# Patient Record
Sex: Female | Born: 1965
Health system: Southern US, Community
[De-identification: ages and names within clinical notes are randomized; demographics above are authoritative.]

## PROBLEM LIST (undated history)

## (undated) DIAGNOSIS — G35 Multiple sclerosis: Secondary | ICD-10-CM

## (undated) DIAGNOSIS — I69351 Hemiplegia and hemiparesis following cerebral infarction affecting right dominant side: Secondary | ICD-10-CM

## (undated) DIAGNOSIS — I10 Essential (primary) hypertension: Secondary | ICD-10-CM

## (undated) DIAGNOSIS — I2510 Atherosclerotic heart disease of native coronary artery without angina pectoris: Secondary | ICD-10-CM

## (undated) DIAGNOSIS — E785 Hyperlipidemia, unspecified: Secondary | ICD-10-CM

## (undated) DIAGNOSIS — I639 Cerebral infarction, unspecified: Secondary | ICD-10-CM

## (undated) DIAGNOSIS — Z9989 Dependence on other enabling machines and devices: Secondary | ICD-10-CM

## (undated) DIAGNOSIS — E119 Type 2 diabetes mellitus without complications: Secondary | ICD-10-CM

## (undated) DIAGNOSIS — N189 Chronic kidney disease, unspecified: Secondary | ICD-10-CM

## (undated) HISTORY — DX: Cerebral infarction, unspecified: I63.9

## (undated) HISTORY — DX: Hemiplegia and hemiparesis following cerebral infarction affecting right dominant side: I69.351

## (undated) HISTORY — DX: Essential (primary) hypertension: I10

## (undated) HISTORY — PX: APPENDECTOMY: SHX54

## (undated) HISTORY — DX: Hyperlipidemia, unspecified: E78.5

## (undated) HISTORY — PX: OVARIAN CYST SURGERY: SHX726

## (undated) HISTORY — PX: CYSTOSTOMY W/ BLADDER BIOPSY: SHX1431

## (undated) HISTORY — DX: Multiple sclerosis: G35

## (undated) HISTORY — DX: Type 2 diabetes mellitus without complications: E11.9

## (undated) HISTORY — PX: OTHER SURGICAL HISTORY: SHX169

---

## 2007-05-18 DIAGNOSIS — G35 Multiple sclerosis: Secondary | ICD-10-CM

## 2007-05-18 DIAGNOSIS — I219 Acute myocardial infarction, unspecified: Secondary | ICD-10-CM

## 2007-05-18 DIAGNOSIS — I252 Old myocardial infarction: Secondary | ICD-10-CM | POA: Insufficient documentation

## 2007-05-18 DIAGNOSIS — I2510 Atherosclerotic heart disease of native coronary artery without angina pectoris: Secondary | ICD-10-CM | POA: Insufficient documentation

## 2007-05-18 HISTORY — PX: PERCUTANEOUS CORONARY STENT INTERVENTION (PCI-S): SHX6016

## 2007-05-18 HISTORY — DX: Atherosclerotic heart disease of native coronary artery without angina pectoris: I25.10

## 2007-05-18 HISTORY — DX: Acute myocardial infarction, unspecified: I21.9

## 2007-05-18 HISTORY — DX: Multiple sclerosis: G35

## 2015-12-25 HISTORY — PX: NM MYOVIEW LTD: HXRAD82

## 2016-01-07 DIAGNOSIS — I2510 Atherosclerotic heart disease of native coronary artery without angina pectoris: Secondary | ICD-10-CM | POA: Insufficient documentation

## 2016-01-08 DIAGNOSIS — N179 Acute kidney failure, unspecified: Secondary | ICD-10-CM | POA: Insufficient documentation

## 2016-09-22 DIAGNOSIS — I69351 Hemiplegia and hemiparesis following cerebral infarction affecting right dominant side: Secondary | ICD-10-CM | POA: Insufficient documentation

## 2017-07-11 HISTORY — PX: TRANSTHORACIC ECHOCARDIOGRAM: SHX275

## 2017-07-12 DIAGNOSIS — R112 Nausea with vomiting, unspecified: Secondary | ICD-10-CM

## 2017-07-12 DIAGNOSIS — I639 Cerebral infarction, unspecified: Secondary | ICD-10-CM

## 2017-07-12 HISTORY — DX: Cerebral infarction, unspecified: I63.9

## 2017-07-12 HISTORY — DX: Nausea with vomiting, unspecified: R11.2

## 2017-07-18 DIAGNOSIS — N3289 Other specified disorders of bladder: Secondary | ICD-10-CM | POA: Insufficient documentation

## 2017-07-18 DIAGNOSIS — R319 Hematuria, unspecified: Secondary | ICD-10-CM | POA: Insufficient documentation

## 2017-07-28 DIAGNOSIS — R1030 Lower abdominal pain, unspecified: Secondary | ICD-10-CM | POA: Insufficient documentation

## 2018-03-15 ENCOUNTER — Ambulatory Visit: Payer: Medicare Other | Admitting: Physical Therapy

## 2018-03-22 ENCOUNTER — Other Ambulatory Visit: Payer: Self-pay

## 2018-03-22 ENCOUNTER — Ambulatory Visit: Payer: Medicare Other | Attending: Family Medicine | Admitting: Physical Therapy

## 2018-03-22 ENCOUNTER — Encounter: Payer: Self-pay | Admitting: Physical Therapy

## 2018-03-22 DIAGNOSIS — R262 Difficulty in walking, not elsewhere classified: Secondary | ICD-10-CM | POA: Diagnosis present

## 2018-03-22 DIAGNOSIS — I69351 Hemiplegia and hemiparesis following cerebral infarction affecting right dominant side: Secondary | ICD-10-CM | POA: Insufficient documentation

## 2018-03-22 DIAGNOSIS — R2689 Other abnormalities of gait and mobility: Secondary | ICD-10-CM | POA: Insufficient documentation

## 2018-03-22 NOTE — Therapy (Signed)
[facility] [address] [place], [place], [address] Phone: [phone]   Fax:  [phone]  Physical Therapy Evaluation  Patient Details  Name: Hannah [name] MRN: [id number] Date of Birth: 19-Jun-1965 Referring Provider (PT): Via, Lennette Bihari, MD   Encounter Date: 03/22/2018  PT End of Session - 03/22/18 1607    Visit Number  1    Date for PT Re-Evaluation  05/17/18    PT Start Time  0092    PT Stop Time  1527    PT Time Calculation (min)  42 min    Equipment Utilized During Treatment  --   rollator   Activity Tolerance  Patient tolerated treatment well    Behavior During Therapy  [facility] for tasks assessed/performed       Past Medical History:  Diagnosis Date  . Cerebral infarction ([place]) 07/12/2017   2009 first one, has had a total of 4 stokes  . Diabetes type 2, controlled ([place])   . Multiple sclerosis ([place]) 2009    History reviewed. No pertinent surgical history.  There were no vitals filed for this visit.   Subjective Assessment - 03/22/18 1453    Subjective  Pt is a pleasant 52yo female who presents to PT s/p her 4th stroke which occurred in late Feb.  She is accompanied by her adult daughter with whom she lives.  PMH is significant for previous strokes, diabetes (controlled), and MS.  She presents with rollator walker which she reports getting 2 weeks ago and now uses for community ambulation.  She uses a 2-wheel walker in the home.  She was walking without an AD before most recent stroke.  Her stroke has affected her Rt dominant side as well as her speech and memory.  Pt reports she has become significantly limited from this recent stroke compared to the first three.  She did not do rehab after this stroke as she says "they were going to send me to a nursing home and I didn't want to do that."    Patient is accompained by:  Family member   adult daughter   Pertinent History  4th stroke, MS, diabetic    Limitations   Walking;House hold activities;Lifting;Standing;Writing;Reading    Patient Stated Goals  balance, walking better, get stronger, stairs    Currently in Pain?  No/denies         [facility] PT Assessment - 03/22/18 0001      Assessment   Medical Diagnosis  I69.351 (ICD-10-CM) - Hemiplegia and hemiparesis following cerebral infarction affecting right dominant side    Referring Provider (PT)  Via, Lennette Bihari, MD    Onset Date/Surgical Date  07/13/17    Hand Dominance  Right    Next MD Visit  Jan 2020    Prior Therapy  after prior strokes, home health PT    no rehab after Feb 2019 stroke     Precautions   Precautions  None      Restrictions   Weight Bearing Restrictions  No      Balance Screen   Has the patient fallen in the past 6 months  Yes    How many times?  3    Has the patient had a decrease in activity level because of a fear of falling?   No    Is the patient reluctant to leave their home because of a fear of falling?   No      Home Film/video editor  residence    Living Arrangements  Children    Available Help at Discharge  Family    Type of [place] to enter    Entrance Stairs-Number of Steps  [place]  One level    [place] - 2 wheels;Shower seat;Toilet riser   rollator for community ambulation     Prior Function   Level of Independence  Needs assistance with transfers;Needs assistance with ADLs;Requires assistive device for independence;Independent with household mobility with device;Independent with community mobility with device;Needs assistance with homemaking   currently dependent on assistive device full time, daughter   Vocation  Unemployed    Leisure  used to write and read but not now      Cognition   Overall Cognitive Status  Impaired/Different from baseline    Area of Impairment  Memory    Memory  Decreased short-term memory      Tone   Assessment  Location  Right Lower Extremity;Right Upper Extremity      ROM / Strength   AROM / PROM / Strength  AROM;PROM;Strength      AROM   AROM Assessment Site  Shoulder    Right/Left Shoulder  Right    Right Shoulder Flexion  100 Degrees    Right Shoulder ABduction  70 Degrees    Right/Left Hip  --    Right Hip Flexion  --    Right Hip External Rotation   --    Right Hip Internal Rotation   --    Right Hip ABduction  --      PROM   PROM Assessment Site  Shoulder;Hip    Right/Left Shoulder  Right    Right Shoulder Flexion  110 Degrees   firm end feel   Right Shoulder ABduction  70 Degrees   firm end feel   Right/Left Hip  Right    Right Hip External Rotation   60   flexion 10, spastic, abduction 20, spastic   Right Hip Internal Rotation   10      Strength   Overall Strength  Deficits    Overall Strength Comments  grossly 4/5 Lt UE and Lt LE    Strength Assessment Site  Shoulder;Hip;Knee    Right/Left Shoulder  Right    Right Shoulder Flexion  3/5    Right Shoulder Extension  3/5    Right Shoulder ABduction  3/5    Right/Left Hip  Right    Right Hip Flexion  3+/5    Right Hip Extension  3+/5    Right Hip ABduction  3-/5    Right/Left Knee  Right    Right Knee Flexion  3+/5    Right Knee Extension  3+/5      Ambulation/Gait   Ambulation/Gait  Yes    Ambulation/Gait Assistance  6: Modified independent (Device/Increase time)    Ambulation Distance (Feet)  60 Feet    Assistive device  Rollator    Gait Pattern  Step-through pattern;Decreased step length - right   Rt LE spasticity with mild toe walking   Ambulation Surface  Level;Indoor    Stairs  Yes    Stairs Assistance  4: Min guard    Stair Management Technique  Two rails;Forwards;Backwards;Step to pattern   forwards up, backwards down   Number of Stairs  4    Height of Stairs  6  RUE Tone   RUE Tone  Mild;Hypertonic      RLE Tone   RLE Tone  Severe;Hypertonic                Objective measurements  completed on examination: See above findings.                PT Short Term Goals - 03/22/18 2052      PT SHORT TERM GOAL #1   Title  Pt will be independent in initial HEP.    Time  3    Period  Weeks    Status  New    Target Date  04/12/18      PT SHORT TERM GOAL #2   Title  Pt will report incorporation of Rt UE into at least 25% of daily tasks.    Time  4    Period  Weeks    Status  New    Target Date  04/19/18      PT SHORT TERM GOAL #3   Title  Pt will demonstrate safe transfers with most efficient strategy from sit to stand, stand to sit and all bed mobility tasks.    Time  4    Period  Weeks    Status  New    Target Date  04/19/18      PT SHORT TERM GOAL #4   Title  Pt will achieve Rt shoulder flexion A/ROM to at least 120 degrees to improve overhead task abilities.        PT Long Term Goals - 03/22/18 2057      PT LONG TERM GOAL #1   Title  Pt will report functional use of Rt UE for at least 50% of daily tasks.    Time  8    Period  Weeks    Status  New    Target Date  05/17/18      PT LONG TERM GOAL #2   Title  Pt will perform community ambulation x 500 feet with least restrictive assistive device while negotiating a curb and ramp to improve safety and endurance in the community.    Time  8    Period  Weeks    Status  New    Target Date  05/17/18      PT LONG TERM GOAL #3   Title  Pt will perform safe and efficient strategy for ascending/descending stairs with least restrictive assistive device so she will be able to negotiate flight of stairs to/from her apartment.    Time  8    Period  Weeks    Status  New    Target Date  05/17/18             Plan - 03/22/18 2101    Clinical Impression Statement  Pt presents to PT with history of MS and 4 strokes over the past 10 years.  Most recent stroke occurred in Feb 2019 and has reportedly had the greatest impact on her function.  She was previously able to ambulate without AD but now uses a  2-wheel walker or rollator full time.  She appears to have partial return of strength and tone returning in her Rt UE and displays significant spasticity in Rt LE.  Rt shoulder A/ROM and P/ROM are limited and weak with capsular end feel.  She ambulates with rigidity in Rt LE and mild toe walk with rollator.  She lives in a second-floor apartment which is without an Media planner requiring her  to negotiate approx. 13 steps each time she leaves her home.  She currently goes up stairs forward and down stairs backward with use of rails and step-to pattern.  She will benefit from skilled PT to address her deficits and maximize her functional independence following most recent stroke.     History and Personal Factors relevant to plan of care:  four strokes in 10 years, MS, diabetic    Clinical Presentation  Evolving    Clinical Decision Making  Moderate    Rehab Potential  Fair    Clinical Impairments Affecting Rehab Potential  four strokes in 10 years with significant change in status with recent stroke, PMH of MS, diabetes    PT Frequency  2x / week    PT Duration  8 weeks    PT Treatment/Interventions  ADLs/Self Care Home Management;DME Instruction;Gait training;Stair training;Functional mobility training;Therapeutic activities;Therapeutic exercise;Balance training;Neuromuscular re-education;Cognitive remediation;Patient/family education;Orthotic Fit/Training;Manual techniques;Passive range of motion;Energy conservation;Splinting;Visual/perceptual remediation/compensation    PT Next Visit Plan  bed mobility, transfers, gait, balance, stair training, Rt shoulder ROM, perform other functional tests and update/revise goals as needed    PT Home Exercise Plan  begin initial HEP    Consulted and Agree with Plan of Care  Patient;Family member/caregiver       Patient will benefit from skilled therapeutic intervention in order to improve the following deficits and impairments:  Abnormal gait, Decreased coordination,  Decreased range of motion, Difficulty walking, Impaired tone, Decreased endurance, Decreased safety awareness, Increased muscle spasms, Impaired UE functional use, Decreased activity tolerance, Impaired vision/preception, Decreased balance, Decreased knowledge of use of DME, Hypomobility, Improper body mechanics, Decreased cognition, Decreased mobility, Decreased strength  Visit Diagnosis: Hemiplegia and hemiparesis following cerebral infarction affecting right dominant side ([place]) - Plan: PT plan of care cert/re-cert  Difficulty in walking, not elsewhere classified - Plan: PT plan of care cert/re-cert  Other abnormalities of gait and mobility - Plan: PT plan of care cert/re-cert     Problem List There are no active problems to display for this patient.   Venetia Night Francies Inch, PT 03/22/18 9:24 PM    [facility] [address] [place], [place], [address] Phone: [phone]   Fax:  [phone]  Name: Jossilyn Benda MRN: [id number] Date of Birth: 04-24-1966

## 2018-03-29 ENCOUNTER — Ambulatory Visit: Payer: Medicare Other | Attending: Family Medicine | Admitting: Physical Therapy

## 2018-03-29 DIAGNOSIS — I69351 Hemiplegia and hemiparesis following cerebral infarction affecting right dominant side: Secondary | ICD-10-CM

## 2018-03-29 DIAGNOSIS — R2689 Other abnormalities of gait and mobility: Secondary | ICD-10-CM

## 2018-03-29 DIAGNOSIS — R262 Difficulty in walking, not elsewhere classified: Secondary | ICD-10-CM | POA: Diagnosis present

## 2018-03-29 NOTE — Therapy (Signed)
[facility] [address] [place], [place], [address] Phone: [phone]   Fax:  [phone]  Physical Therapy Treatment  Patient Details  Name: Hannah [name] MRN: [id number] Date of Birth: July 26, 1965 Referring Provider (PT): Via, Lennette Bihari, MD   Encounter Date: 03/29/2018  PT End of Session - 03/29/18 1440    Visit Number  2    Number of Visits  17    Date for PT Re-Evaluation  05/17/18    Authorization Type  [employer] Medicare    PT Start Time  2992    PT Stop Time  1430    PT Time Calculation (min)  45 min    Activity Tolerance  Patient tolerated treatment well    Behavior During Therapy  [facility] for tasks assessed/performed       Past Medical History:  Diagnosis Date  . Cerebral infarction ([place]) 07/12/2017   2009 first one, has had a total of 4 stokes  . Diabetes type 2, controlled ([place])   . Multiple sclerosis ([place]) 2009    No past surgical history on file.  There were no vitals filed for this visit.  Subjective Assessment - 03/29/18 1435    Subjective  daughter present for session - pt ambulating with rollator. patient reporting she has not seen neurologist in years.     Patient is accompained by:  Family member   adult daughter   Pertinent History  4th stroke, MS, diabetic    Limitations  Walking;House hold activities;Lifting;Standing;Writing;Reading    Patient Stated Goals  balance, walking better, get stronger, stairs    Currently in Pain?  No/denies                       [facility] Adult PT Treatment/Exercise - 03/29/18 0001      Exercises   Exercises  Knee/Hip      Knee/Hip Exercises: Stretches   Passive Hamstring Stretch  Right;3 reps;30 seconds    Passive Hamstring Stretch Limitations  LE extended on stool with overpressure into dorsiflexion      Knee/Hip Exercises: Standing   Other Standing Knee Exercises  high knee marching at rollator x 10 each LE      Knee/Hip Exercises: Seated   Long Arc Quad  Strengthening;Right;10 reps    Sit to [employer]  1 set;10 reps;with UE support      Knee/Hip Exercises: Supine   Bridges  Strengthening;Both;10 reps             PT Education - 03/29/18 1445    Education Details  education/discussion on need for referral to neurology and OT. Education on current POC and current status/goals; education on initial HEP handout     Person(s) Educated  Patient;Child(ren)    Methods  Explanation;Demonstration;Handout    Comprehension  Verbalized understanding;Returned demonstration       PT Short Term Goals - 03/22/18 2052      PT SHORT TERM GOAL #1   Title  Pt will be independent in initial HEP.    Time  3    Period  Weeks    Status  New    Target Date  04/12/18      PT SHORT TERM GOAL #2   Title  Pt will report incorporation of Rt UE into at least 25% of daily tasks.    Time  4    Period  Weeks    Status  New    Target Date  04/19/18  PT SHORT TERM GOAL #3   Title  Pt will demonstrate safe transfers with most efficient strategy from sit to stand, stand to sit and all bed mobility tasks.    Time  4    Period  Weeks    Status  New    Target Date  04/19/18      PT SHORT TERM GOAL #4   Title  Pt will achieve Rt shoulder flexion A/ROM to at least 120 degrees to improve overhead task abilities.        PT Long Term Goals - 03/22/18 2057      PT LONG TERM GOAL #1   Title  Pt will report functional use of Rt UE for at least 50% of daily tasks.    Time  8    Period  Weeks    Status  New    Target Date  05/17/18      PT LONG TERM GOAL #2   Title  Pt will perform community ambulation x 500 feet with least restrictive assistive device while negotiating a curb and ramp to improve safety and endurance in the community.    Time  8    Period  Weeks    Status  New    Target Date  05/17/18      PT LONG TERM GOAL #3   Title  Pt will perform safe and efficient strategy for ascending/descending stairs with least restrictive  assistive device so she will be able to negotiate flight of stairs to/from her apartment.    Time  8    Period  Weeks    Status  New    Target Date  05/17/18            Plan - 03/29/18 1441    Clinical Impression Statement  Patient seen at neuro PT today for follow-up. Ambulating with rollator and daughter present and supportive. Lengthy discussion on current status and care team with patient reporting she has not been sen by neurologist in some time. Education provided by PT stating note to be sent to PCP requesting referral to neurology as well as OT to address UE defcits at R UE. Patient and daughter in agreement of both services. Remainder of session spent establishing initial HEP for gentle stretching and strengthening for improved functional mobility. Patient and daughter with good carryover of tasks. Clonus noted at B ankle with R > L. Will continue to progress towards goals.     Rehab Potential  Fair    Clinical Impairments Affecting Rehab Potential  four strokes in 10 years with significant change in status with recent stroke, PMH of MS, diabetes    PT Frequency  2x / week    PT Duration  8 weeks    PT Treatment/Interventions  ADLs/Self Care Home Management;DME Instruction;Gait training;Stair training;Functional mobility training;Therapeutic activities;Therapeutic exercise;Balance training;Neuromuscular re-education;Cognitive remediation;Patient/family education;Orthotic Fit/Training;Manual techniques;Passive range of motion;Energy conservation;Splinting;Visual/perceptual remediation/compensation    PT Next Visit Plan  bed mobility, transfers, gait, balance, stair training, Rt shoulder ROM, perform other functional tests and update/revise goals as needed; send note for referral to [place] and Agree with Plan of Care  Patient;Family member/caregiver       Patient will benefit from skilled therapeutic intervention in order to improve the  following deficits and impairments:  Abnormal gait, Decreased coordination, Decreased range of motion, Difficulty walking, Impaired tone, Decreased endurance, Decreased safety awareness, Increased muscle spasms, Impaired  UE functional use, Decreased activity tolerance, Impaired vision/preception, Decreased balance, Decreased knowledge of use of DME, Hypomobility, Improper body mechanics, Decreased cognition, Decreased mobility, Decreased strength  Visit Diagnosis: Hemiplegia and hemiparesis following cerebral infarction affecting right dominant side ([facility])  Difficulty in walking, not elsewhere classified  Other abnormalities of gait and mobility     Problem List There are no active problems to display for this patient.    Lanney Gins, PT, DPT Supplemental Physical Therapist 03/29/18 4:35 PM Pager: [phone] Office: [place] [address] [place], [place], [address] Phone: [phone]   Fax:  [phone]  Name: Hannah [name] MRN: [id number] Date of Birth: 1965-11-16

## 2018-03-29 NOTE — Patient Instructions (Signed)
Access Code: [email or web address]  URL: [email or web address]  Date: 03/29/2018  Prepared by: Allena Katz   Exercises  Supine Bridge - 10 reps - 2 sets - 1x daily - 7x weekly  Seated Long Arc Quad - 10 reps - 2 sets - 1x daily - 7x weekly  Standing March with Counter Support - 10 reps - 2 sets - 1x daily - 7x weekly  Seated Hamstring Stretch with Chair - 3 reps - 1 sets - 30 hold - 1x daily - 7x weekly  Sit to Stand with Armchair - 10 reps - 2 sets - 1x daily - 7x weekly

## 2018-03-31 ENCOUNTER — Ambulatory Visit: Payer: Medicare Other | Admitting: Physical Therapy

## 2018-03-31 ENCOUNTER — Encounter: Payer: Self-pay | Admitting: Physical Therapy

## 2018-03-31 DIAGNOSIS — I69351 Hemiplegia and hemiparesis following cerebral infarction affecting right dominant side: Secondary | ICD-10-CM

## 2018-03-31 DIAGNOSIS — R262 Difficulty in walking, not elsewhere classified: Secondary | ICD-10-CM

## 2018-03-31 DIAGNOSIS — R2689 Other abnormalities of gait and mobility: Secondary | ICD-10-CM

## 2018-03-31 NOTE — Therapy (Signed)
[facility] [address] [place], [place], [address] Phone: [phone]   Fax:  [phone]  Physical Therapy Treatment  Patient Details  Name: Hannah [name] MRN: [id number] Date of Birth: Mar 12, 1966 Referring Provider (PT): Via, Lennette Bihari, MD   Encounter Date: 03/31/2018  PT End of Session - 03/31/18 1921    Visit Number  3    Number of Visits  17    Date for PT Re-Evaluation  05/17/18    Authorization Type  [employer] Medicare    PT Start Time  1400    PT Stop Time  1445    PT Time Calculation (min)  45 min    Activity Tolerance  Patient tolerated treatment well    Behavior During Therapy  [facility] for tasks assessed/performed       Past Medical History:  Diagnosis Date  . Cerebral infarction ([place]) 07/12/2017   2009 first one, has had a total of 4 stokes  . Diabetes type 2, controlled ([place])   . Multiple sclerosis ([place]) 2009    History reviewed. No pertinent surgical history.  There were no vitals filed for this visit.  Subjective Assessment - 03/31/18 1409    Subjective  No issues to report and no falls.  May call Dr. Via to check on the OT order.    Patient is accompained by:  Family member   adult daughter   Pertinent History  4th stroke, MS, diabetic    Limitations  Walking;House hold activities;Lifting;Standing;Writing;Reading    Patient Stated Goals  balance, walking better, get stronger, stairs    Currently in Pain?  No/denies         [facility] PT Assessment - 03/31/18 1413      Standardized Balance Assessment   Standardized Balance Assessment  Berg Balance Test;Five Times Sit to Stand;10 meter walk test    Five times sit to stand comments   1:12 without use of UE but pushes back on mat with back of legs and increased difficulty shifting forwards    10 Meter Walk  25.28 seconds or 1.29 ft/sec with rollator      Berg Balance Test   Sit to Stand  Able to stand  independently using hands    Standing Unsupported   Able to stand 2 minutes with supervision    Sitting with Back Unsupported but Feet Supported on Floor or Stool  Able to sit safely and securely 2 minutes    Stand to Sit  Controls descent by using hands    Transfers  Able to transfer safely, definite need of hands    Standing Unsupported with Eyes Closed  Able to stand 10 seconds with supervision    Standing Ubsupported with Feet Together  Needs help to attain position but able to stand for 30 seconds with feet together    From Standing, Reach Forward with Outstretched Arm  Can reach forward >12 cm safely (5")    From Standing Position, Pick up Object from Floor  Able to pick up shoe, needs supervision    From Standing Position, Turn to Look Behind Over each Shoulder  Looks behind from both sides and weight shifts well    Turn 360 Degrees  Needs close supervision or verbal cueing    Standing Unsupported, Alternately Place Feet on Step/Stool  Able to complete >2 steps/needs minimal assist    Standing Unsupported, One Foot in Front  Needs help to step but can hold 15 seconds    Standing on One  Leg  Tries to lift leg/unable to hold 3 seconds but remains standing independently    Total Score  34    Berg comment:  34/56                           PT Education - 03/31/18 1920    Education Details  Awaiting referral to OT; will send referral request to neurology.  Outcomes of assessments    Person(s) Educated  Patient;Child(ren)    Methods  Explanation    Comprehension  Verbalized understanding       PT Short Term Goals - 03/31/18 1932      PT SHORT TERM GOAL #1   Title  Pt will be independent in initial HEP.    Time  3    Period  Weeks    Status  New    Target Date  04/21/18      PT SHORT TERM GOAL #2   Title  Pt will improve LE strength as indicated by five time sit to stance to </= 50 seconds without UE support     Baseline  1:12 from mat, pushed back on mat with back of legs    Time  4    Period  Weeks     Status  New    Target Date  04/21/18      PT SHORT TERM GOAL #3   Title  Pt will demonstrate safe transfers with supervision from sit to stand, stand to sit and all bed mobility tasks.    Time  4    Period  Weeks    Status  Revised    Target Date  04/21/18      PT SHORT TERM GOAL #4   Title  Pt will improve BERG by 4 points    Baseline  34/56    Time  4    Period  Weeks    Status  New    Target Date  04/21/18      PT SHORT TERM GOAL #5   Title  Pt will improve gait velocity with RW to >/= 1.6 ft/sec    Baseline  1.29 ft/sec    Time  4    Period  Weeks    Target Date  04/21/18        PT Long Term Goals - 03/31/18 1937      PT LONG TERM GOAL #1   Title  Pt and daughter will demonstrate independence with HEP    Time  8    Period  Weeks    Status  New    Target Date  05/21/18      PT LONG TERM GOAL #2   Title  Pt will perform community ambulation x 500 feet with least restrictive assistive device while negotiating a curb and ramp to improve safety and endurance in the community with supervision    Time  8    Period  Weeks    Status  New    Target Date  05/21/17      PT LONG TERM GOAL #3   Title  Pt will perform safe and efficient strategy for ascending/descending stairs with least restrictive assistive device so she will be able to negotiate flight of stairs to/from her apartment.    Time  8    Period  Weeks    Status  New    Target Date  05/21/17      PT LONG TERM  GOAL #4   Title  Pt will improve five time sit to stand to </= 40 seconds without use of UE    Time  8    Period  Weeks    Status  New    Target Date  05/21/18      PT LONG TERM GOAL #5   Title  Pt will improve BERG by 8 points (from initial assessment)    Baseline  34/56    Time  8    Period  Weeks    Status  New    Target Date  05/21/18      PT LONG TERM GOAL #6   Title  Pt will improve gait velocity with rollator and appropriate AFO to >/= 2.2 ft/sec    Time  8    Period  Weeks    Status   New    Target Date  05/21/18            Plan - 03/31/18 1922    Clinical Impression Statement  Performed formal assessment of patient's functional LE strength and sit to stand sequencing with five time sit to stand, falls risk and balance assessment with BERG and gait velocity.  Pt demonstrates high falls risk especially with tasks that require single limb stance and pt is heavily reliant on UE for support.  LTG updated.  Will begin to update HEP to address these impairments.    Rehab Potential  Fair    Clinical Impairments Affecting Rehab Potential  four strokes in 10 years with significant change in status with recent stroke, PMH of MS, diabetes    PT Frequency  2x / week    PT Duration  8 weeks    PT Treatment/Interventions  ADLs/Self Care Home Management;DME Instruction;Gait training;Stair training;Functional mobility training;Therapeutic activities;Therapeutic exercise;Balance training;Neuromuscular re-education;Cognitive remediation;Patient/family education;Orthotic Fit/Training;Manual techniques;Passive range of motion;Energy conservation;Splinting;Visual/perceptual remediation/compensation    PT Next Visit Plan  Has OT referral come back?  add to HEP balance exercises based on BERG.  bed mobility, transfers, gait, balance, stair training, Rt shoulder ROM    PT Home Exercise Plan  [email or web address]     Recommended Other Services  AFO?  Aquatic?    Consulted and Agree with Plan of Care  Patient;Family member/caregiver    Family Member Consulted  daughter       Patient will benefit from skilled therapeutic intervention in order to improve the following deficits and impairments:  Abnormal gait, Decreased coordination, Decreased range of motion, Difficulty walking, Impaired tone, Decreased endurance, Decreased safety awareness, Increased muscle spasms, Impaired UE functional use, Decreased activity tolerance, Impaired vision/preception, Decreased balance, Decreased knowledge of use of DME,  Hypomobility, Improper body mechanics, Decreased cognition, Decreased mobility, Decreased strength  Visit Diagnosis: Hemiplegia and hemiparesis following cerebral infarction affecting right dominant side (HCC)  Difficulty in walking, not elsewhere classified  Other abnormalities of gait and mobility     Problem List There are no active problems to display for this patient.   Rico Junker, PT, DPT 03/31/18    7:41 PM    [facility] [address] [place], [place], [address] Phone: [phone]   Fax:  [phone]  Name: Hannah [name] MRN: [id number] Date of Birth: 1965/09/17

## 2018-04-05 ENCOUNTER — Ambulatory Visit: Payer: Medicare Other | Admitting: Physical Therapy

## 2018-04-05 ENCOUNTER — Encounter: Payer: Self-pay | Admitting: Physical Therapy

## 2018-04-05 DIAGNOSIS — R2689 Other abnormalities of gait and mobility: Secondary | ICD-10-CM

## 2018-04-05 DIAGNOSIS — I69351 Hemiplegia and hemiparesis following cerebral infarction affecting right dominant side: Secondary | ICD-10-CM | POA: Diagnosis not present

## 2018-04-05 DIAGNOSIS — R262 Difficulty in walking, not elsewhere classified: Secondary | ICD-10-CM

## 2018-04-05 NOTE — Therapy (Signed)
[facility] [address] [place], [place], [address] Phone: [phone]   Fax:  [phone]  Physical Therapy Treatment  Patient Details  Name: Hannah [name] MRN: [id number] Date of Birth: [birth year] Referring Provider (PT): Via, Lennette Bihari, MD   Encounter Date: 04/05/2018  PT End of Session - 04/05/18 1542    Visit Number  4    Number of Visits  17    Date for PT Re-Evaluation  05/17/18    Authorization Type  [employer] Medicare    PT Start Time  1536    PT Stop Time  1615    PT Time Calculation (min)  39 min    Activity Tolerance  Patient tolerated treatment well    Behavior During Therapy  [facility] for tasks assessed/performed       Past Medical History:  Diagnosis Date  . Cerebral infarction ([place]) 07/12/2017   2009 first one, has had a total of 4 stokes  . Diabetes type 2, controlled ([place])   . Multiple sclerosis ([place]) 2009    History reviewed. No pertinent surgical history.  There were no vitals filed for this visit.  Subjective Assessment - 04/05/18 1542    Subjective  doing well - seen by eye MD today with injections given - some blurriness    Patient is accompained by:  Family member    Pertinent History  4th stroke, MS, diabetic    Limitations  Walking;House hold activities;Lifting;Standing;Writing;Reading    Patient Stated Goals  balance, walking better, get stronger, stairs    Currently in Pain?  No/denies    Multiple Pain Sites  No                       [facility] Adult PT Treatment/Exercise - 04/05/18 0001      Ambulation/Gait   Ambulation/Gait  Yes    Ambulation/Gait Assistance  5: Supervision    Ambulation/Gait Assistance Details  supervision with cueing for increased step length and to promote R heel strike    Ambulation Distance (Feet)  100 Feet    Assistive device  Rollator    Gait Pattern  Step-through pattern;Decreased step length - right;Decreased step length - left;Decreased stance  time - right;Decreased hip/knee flexion - right;Decreased dorsiflexion - right;Decreased weight shift to right    Ambulation Surface  Level;Indoor      Exercises   Exercises  Other Exercises    Other Exercises   tall kneeling with k-table: heel sitting to tall kneeling with B UE support x 10; tall kneel side stepping with k-table (4 steps bilaterally x 6 reps)          Balance Exercises - 04/05/18 1619      Balance Exercises: Standing   Standing Eyes Opened  Narrow base of support (BOS);3 reps;30 secs   in corner   Tandem Stance  Eyes open;2 reps;30 secs   bilateral; modified 1/2 step   SLS  Eyes open;Upper extremity support 1;4 reps;20 secs   bilateral       PT Education - 04/05/18 1625    Education Details  discussion to make OT appointment for Eval and to fax prior neurology notes to PCP for referral to [facility] neurologist    Person(s) Educated  Patient;Child(ren)    Methods  Explanation    Comprehension  Verbalized understanding       PT Short Term Goals - 03/31/18 1932      PT SHORT TERM GOAL #1   Title  Pt will be independent in initial HEP.    Time  3    Period  Weeks    Status  New    Target Date  04/21/18      PT SHORT TERM GOAL #2   Title  Pt will improve LE strength as indicated by five time sit to stance to </= 50 seconds without UE support     Baseline  1:12 from mat, pushed back on mat with back of legs    Time  4    Period  Weeks    Status  New    Target Date  04/21/18      PT SHORT TERM GOAL #3   Title  Pt will demonstrate safe transfers with supervision from sit to stand, stand to sit and all bed mobility tasks.    Time  4    Period  Weeks    Status  Revised    Target Date  04/21/18      PT SHORT TERM GOAL #4   Title  Pt will improve BERG by 4 points    Baseline  34/56    Time  4    Period  Weeks    Status  New    Target Date  04/21/18      PT SHORT TERM GOAL #5   Title  Pt will improve gait velocity with RW to >/= 1.6 ft/sec     Baseline  1.29 ft/sec    Time  4    Period  Weeks    Target Date  04/21/18        PT Long Term Goals - 03/31/18 1937      PT LONG TERM GOAL #1   Title  Pt and daughter will demonstrate independence with HEP    Time  8    Period  Weeks    Status  New    Target Date  05/21/18      PT LONG TERM GOAL #2   Title  Pt will perform community ambulation x 500 feet with least restrictive assistive device while negotiating a curb and ramp to improve safety and endurance in the community with supervision    Time  8    Period  Weeks    Status  New    Target Date  05/21/17      PT LONG TERM GOAL #3   Title  Pt will perform safe and efficient strategy for ascending/descending stairs with least restrictive assistive device so she will be able to negotiate flight of stairs to/from her apartment.    Time  8    Period  Weeks    Status  New    Target Date  05/21/17      PT LONG TERM GOAL #4   Title  Pt will improve five time sit to stand to </= 40 seconds without use of UE    Time  8    Period  Weeks    Status  New    Target Date  05/21/18      PT LONG TERM GOAL #5   Title  Pt will improve BERG by 8 points (from initial assessment)    Baseline  34/56    Time  8    Period  Weeks    Status  New    Target Date  05/21/18      PT LONG TERM GOAL #6   Title  Pt will improve gait velocity with rollator and appropriate AFO  to >/= 2.2 ft/sec    Time  8    Period  Weeks    Status  New    Target Date  05/21/18            Plan - 04/05/18 1543    Clinical Impression Statement  Patient seen by eye MD today with injections - drop of blood noted at eye crease, wiped clean with no further bleeding - notified daughter who may reach out to MD. Session today focusing on initiating balance activities with HEP update given and explained to both patient and daughter. Noted poor heel strike with R LE with giat today even with cueing and demonstration by PT. WIll continue to benefit from skilled PT.      Rehab Potential  Fair    Clinical Impairments Affecting Rehab Potential  four strokes in 10 years with significant change in status with recent stroke, PMH of MS, diabetes    PT Frequency  2x / week    PT Duration  8 weeks    PT Treatment/Interventions  ADLs/Self Care Home Management;DME Instruction;Gait training;Stair training;Functional mobility training;Therapeutic activities;Therapeutic exercise;Balance training;Neuromuscular re-education;Cognitive remediation;Patient/family education;Orthotic Fit/Training;Manual techniques;Passive range of motion;Energy conservation;Splinting;Visual/perceptual remediation/compensation    PT Next Visit Plan  add to HEP balance exercises based on BERG.  bed mobility, transfers, gait, balance, stair training, Rt shoulder ROM    PT Home Exercise Plan  [email or web address]     Consulted and Agree with Plan of Care  Patient;Family member/caregiver    Family Member Consulted  daughter       Patient will benefit from skilled therapeutic intervention in order to improve the following deficits and impairments:  Abnormal gait, Decreased coordination, Decreased range of motion, Difficulty walking, Impaired tone, Decreased endurance, Decreased safety awareness, Increased muscle spasms, Impaired UE functional use, Decreased activity tolerance, Impaired vision/preception, Decreased balance, Decreased knowledge of use of DME, Hypomobility, Improper body mechanics, Decreased cognition, Decreased mobility, Decreased strength  Visit Diagnosis: Hemiplegia and hemiparesis following cerebral infarction affecting right dominant side (HCC)  Difficulty in walking, not elsewhere classified  Other abnormalities of gait and mobility     Problem List There are no active problems to display for this patient.    Lanney Gins, PT, DPT Supplemental Physical Therapist 04/05/18 4:29 PM Pager: [phone] Office: [facility] [place] [address] [place], [place], [address] Phone: [phone]   Fax:  [phone]  Name: Hannah [name] MRN: [id number] Date of Birth: 28-Apr-1966

## 2018-04-07 ENCOUNTER — Ambulatory Visit: Payer: Medicare Other | Admitting: Physical Therapy

## 2018-04-11 ENCOUNTER — Ambulatory Visit: Payer: Medicare Other | Admitting: Physical Therapy

## 2018-04-12 ENCOUNTER — Ambulatory Visit: Payer: Medicare Other | Admitting: Physical Therapy

## 2018-04-17 ENCOUNTER — Ambulatory Visit: Payer: Medicare Other | Attending: Family Medicine | Admitting: Physical Therapy

## 2018-04-17 ENCOUNTER — Encounter: Payer: Self-pay | Admitting: Physical Therapy

## 2018-04-17 DIAGNOSIS — R262 Difficulty in walking, not elsewhere classified: Secondary | ICD-10-CM | POA: Diagnosis present

## 2018-04-17 DIAGNOSIS — I69351 Hemiplegia and hemiparesis following cerebral infarction affecting right dominant side: Secondary | ICD-10-CM | POA: Insufficient documentation

## 2018-04-17 DIAGNOSIS — R2689 Other abnormalities of gait and mobility: Secondary | ICD-10-CM | POA: Diagnosis not present

## 2018-04-17 NOTE — Therapy (Signed)
[facility] [address] [place], [place], [address] Phone: [phone]   Fax:  [phone]  Physical Therapy Treatment  Patient Details  Name: Hannah [name] MRN: [id number] Date of Birth: [birth year] Referring Provider (PT): Via, Lennette Bihari, MD   Encounter Date: 04/17/2018  PT End of Session - 04/17/18 1619    Visit Number  5    Number of Visits  17    Date for PT Re-Evaluation  05/17/18    Authorization Type  [employer] Medicare    PT Start Time  1404    PT Stop Time  1445    PT Time Calculation (min)  41 min    Activity Tolerance  Patient tolerated treatment well    Behavior During Therapy  [facility] for tasks assessed/performed       Past Medical History:  Diagnosis Date  . Cerebral infarction ([place]) 07/12/2017   2009 first one, has had a total of 4 stokes  . Diabetes type 2, controlled ([place])   . Multiple sclerosis ([place]) 2009    History reviewed. No pertinent surgical history.  There were no vitals filed for this visit.  Subjective Assessment - 04/17/18 1404    Subjective  She reports walking around the whole church last Sunday and asked the doctor for a cane.  She keeps relying on rollator, though.  No falls.    Patient is accompained by:  Family member    Pertinent History  4th stroke, MS, diabetic    Limitations  Walking;House hold activities;Lifting;Standing;Writing;Reading    Patient Stated Goals  balance, walking better, get stronger, stairs    Currently in Pain?  No/denies                       [facility] Adult PT Treatment/Exercise - 04/17/18 0001      Ambulation/Gait   Ambulation/Gait  Yes    Ambulation/Gait Assistance  5: Supervision   tactile cues at hips, verbal cues for increased R heelstrike   Ambulation/Gait Assistance Details  Trialed gait with foot-up brace on RLE (secured with tape), with improved dorsiflexion/foot clearance noted.  However, pt needs cues for slowed pace, to increase RLE  stance time.  Pt noted to have R knee recurvatum during RLE stance.    Ambulation Distance (Feet)  100 Feet   120 x 2 reps with rollator and foot-up brace   Assistive device  Rollator    Gait Pattern  Step-through pattern;Decreased step length - right;Decreased step length - left;Decreased stance time - right;Decreased hip/knee flexion - right;Decreased dorsiflexion - right;Decreased weight shift to right;Right genu recurvatum;Poor foot clearance - right    Ambulation Surface  Level;Indoor    Pre-Gait Activities  At beginning of session, and several times during session, pt inquires about using cane on a more regular basis.  PT discussed fall risk per Berg score and recommendation to use cane, per Berg score <37/56.  PT asked pt if she had ever had AFO; at end of session, pt reports having both double upright AFO for RLE (but only one the right shoe, as the left shoe got lost in the move to [place]) and a plastic AFO (which PT requests pt brings in next visit for further gait assessment).    Gait Comments  With tactile cues at R hip/R quads, pt able to improve R foot clearance and stance time on RLE; however, with only use of foot-up brace, pt is noted to have Rknee recurvatum in stance phase  of gait.          Balance Exercises - 04/17/18 1418      Balance Exercises: Standing   Standing Eyes Opened  Narrow base of support (BOS);Solid surface;3 reps;10 secs    Tandem Stance  Eyes open;Upper extremity support 1;3 reps;10 secs    SLS  Eyes open;Solid surface;Upper extremity support 2;3 reps;10 secs    Sidestepping  2 reps;Upper extremity support   R and L along counter   Other Standing Exercises  RLE as weightbearing leg, with LLE hip abduction taps x 10, kicks x 10; then LLE hip extension taps x 10, kicks x 10; UE's supported at counter with cues for upright posture.        PT Education - 04/17/18 1618    Education Details  Updates to HEP; discussed pt only performing standing exercises  with daughter present for supervision; requested pt to bring her (old) AFO in to next PT session; educated pt again in fall risk per Berg score and to not use cane, but use rollator at this time    Person(s) Educated  Patient   daughter present at beginning of session, but had to go to work   Methods  Explanation;Demonstration;Handout    Comprehension  Verbalized understanding;Returned demonstration;Verbal cues required;Need further instruction       PT Short Term Goals - 03/31/18 1932      PT SHORT TERM GOAL #1   Title  Pt will be independent in initial HEP.    Time  3    Period  Weeks    Status  New    Target Date  04/21/18      PT SHORT TERM GOAL #2   Title  Pt will improve LE strength as indicated by five time sit to stance to </= 50 seconds without UE support     Baseline  1:12 from mat, pushed back on mat with back of legs    Time  4    Period  Weeks    Status  New    Target Date  04/21/18      PT SHORT TERM GOAL #3   Title  Pt will demonstrate safe transfers with supervision from sit to stand, stand to sit and all bed mobility tasks.    Time  4    Period  Weeks    Status  Revised    Target Date  04/21/18      PT SHORT TERM GOAL #4   Title  Pt will improve BERG by 4 points    Baseline  34/56    Time  4    Period  Weeks    Status  New    Target Date  04/21/18      PT SHORT TERM GOAL #5   Title  Pt will improve gait velocity with RW to >/= 1.6 ft/sec    Baseline  1.29 ft/sec    Time  4    Period  Weeks    Target Date  04/21/18        PT Long Term Goals - 03/31/18 1937      PT LONG TERM GOAL #1   Title  Pt and daughter will demonstrate independence with HEP    Time  8    Period  Weeks    Status  New    Target Date  05/21/18      PT LONG TERM GOAL #2   Title  Pt will perform community ambulation x 500  feet with least restrictive assistive device while negotiating a curb and ramp to improve safety and endurance in the community with supervision    Time  8     Period  Weeks    Status  New    Target Date  05/21/17      PT LONG TERM GOAL #3   Title  Pt will perform safe and efficient strategy for ascending/descending stairs with least restrictive assistive device so she will be able to negotiate flight of stairs to/from her apartment.    Time  8    Period  Weeks    Status  New    Target Date  05/21/17      PT LONG TERM GOAL #4   Title  Pt will improve five time sit to stand to </= 40 seconds without use of UE    Time  8    Period  Weeks    Status  New    Target Date  05/21/18      PT LONG TERM GOAL #5   Title  Pt will improve BERG by 8 points (from initial assessment)    Baseline  34/56    Time  8    Period  Weeks    Status  New    Target Date  05/21/18      PT LONG TERM GOAL #6   Title  Pt will improve gait velocity with rollator and appropriate AFO to >/= 2.2 ft/sec    Time  8    Period  Weeks    Status  New    Target Date  05/21/18            Plan - 04/17/18 1620    Clinical Impression Statement  Skilled PT session today addressed balance with additions to HEP.  Educated pt regarding gait safety, as pt wants to walk with a cane; educated pt to use rollator at this time due to high fall risk.  Trialed gait with R foot up brace, as pt's shoe not condusive to using AFO for trial; pt improves R foot clearance, heel strike, step length slightly, but noted to have increased RLE recurvatum in stance.  She does report having an old AFO at home and PT recommends pt to bring that in to next visit.  Pt will continue to benefit from skilled PT to address balance, strength, gait for improved functional mobility and safety.    Rehab Potential  Fair    Clinical Impairments Affecting Rehab Potential  four strokes in 10 years with significant change in status with recent stroke, PMH of MS, diabetes    PT Frequency  2x / week    PT Duration  8 weeks    PT Treatment/Interventions  ADLs/Self Care Home Management;DME Instruction;Gait  training;Stair training;Functional mobility training;Therapeutic activities;Therapeutic exercise;Balance training;Neuromuscular re-education;Cognitive remediation;Patient/family education;Orthotic Fit/Training;Manual techniques;Passive range of motion;Energy conservation;Splinting;Visual/perceptual remediation/compensation    PT Next Visit Plan  Review HEP/may need to take some exercises out so she doesn't have so many; gait training (pt to bring in her old AFO) or possible trial of AFO in clinic    PT Gila and Agree with Plan of Care  Patient;Family member/caregiver    Family Member Consulted  daughter-at beginning of session       Patient will benefit from skilled therapeutic intervention in order to improve the following deficits and impairments:  Abnormal gait, Decreased coordination, Decreased range of motion, Difficulty walking, Impaired  tone, Decreased endurance, Decreased safety awareness, Increased muscle spasms, Impaired UE functional use, Decreased activity tolerance, Impaired vision/preception, Decreased balance, Decreased knowledge of use of DME, Hypomobility, Improper body mechanics, Decreased cognition, Decreased mobility, Decreased strength  Visit Diagnosis: Other abnormalities of gait and mobility     Problem List There are no active problems to display for this patient.   Hykeem Ojeda W. 04/17/2018, 4:24 PM  Frazier Butt., PT   [facility] [address] [place], [place], [address] Phone: [phone]   Fax:  [phone]  Name: Hannah [name] MRN: [id number] Date of Birth: 08-Mar-1966

## 2018-04-17 NOTE — Patient Instructions (Addendum)
Access Code: [email or web address]  URL: [email or web address]  Date: 04/17/2018  Prepared by: Mady Haagensen   Exercises  Supine Bridge - 10 reps - 2 sets - 1x daily - 7x weekly  Seated Long Arc Quad - 10 reps - 2 sets - 1x daily - 7x weekly  Standing March with Counter Support - 10 reps - 2 sets - 1x daily - 7x weekly  Seated Hamstring Stretch with Chair - 3 reps - 1 sets - 30 hold - 1x daily - 7x weekly  Sit to Stand with Armchair - 10 reps - 2 sets - 1x daily - 7x weekly  Romberg Stance - 3-5 reps - 1 sets - 30 hold - 1x daily - 7x weekly  Standing Romberg to 1/2 Tandem Stance - 3-5 reps - 1 sets - 30 hold - 1x daily - 7x weekly   Added 04/17/18 Standing Single Leg Stance with Counter Support - 3 reps - 1 sets - 10 sec hold - 1x daily - 5x weekly  Standing Hip Abduction with Counter Support - 10 reps - 1 sets - 1x daily - 5x weekly  Standing Tandem Balance with Counter Support - 3 reps - 1 sets - 10 sec hold - 1x daily - 5x weekly  Standing Hip Extension with Counter Support - 10 reps - 1 sets - 1x daily - 5x weekly

## 2018-04-18 ENCOUNTER — Ambulatory Visit: Payer: Medicare Other | Admitting: Occupational Therapy

## 2018-04-20 ENCOUNTER — Ambulatory Visit: Payer: Medicare Other | Admitting: Physical Therapy

## 2018-04-21 ENCOUNTER — Ambulatory Visit: Payer: Medicare Other | Admitting: Occupational Therapy

## 2018-04-26 ENCOUNTER — Encounter: Payer: Self-pay | Admitting: Physical Therapy

## 2018-04-26 ENCOUNTER — Ambulatory Visit: Payer: Medicare Other | Admitting: Physical Therapy

## 2018-04-26 DIAGNOSIS — R262 Difficulty in walking, not elsewhere classified: Secondary | ICD-10-CM

## 2018-04-26 DIAGNOSIS — I69351 Hemiplegia and hemiparesis following cerebral infarction affecting right dominant side: Secondary | ICD-10-CM

## 2018-04-26 DIAGNOSIS — R2689 Other abnormalities of gait and mobility: Secondary | ICD-10-CM

## 2018-04-26 NOTE — Therapy (Signed)
[facility] [address] [place], [place], [address] Phone: [phone]   Fax:  [phone]  Physical Therapy Treatment  Patient Details  Name: Hannah [name] MRN: [id number] Date of Birth: March 17, 1966 Referring Provider (PT): Via, Lennette Bihari, MD   Encounter Date: 04/26/2018  PT End of Session - 04/26/18 1415    Visit Number  6    Number of Visits  17    Date for PT Re-Evaluation  05/17/18    Authorization Type  [employer] Medicare    PT Start Time  1315    PT Stop Time  1400    PT Time Calculation (min)  45 min    Activity Tolerance  Patient tolerated treatment well    Behavior During Therapy  [facility] for tasks assessed/performed       Past Medical History:  Diagnosis Date  . Cerebral infarction ([place]) 07/12/2017   2009 first one, has had a total of 4 stokes  . Diabetes type 2, controlled ([place])   . Multiple sclerosis ([place]) 2009    History reviewed. No pertinent surgical history.  There were no vitals filed for this visit.  Subjective Assessment - 04/26/18 1321    Subjective  No falls but states she has had several stumbles and caught her balance; stated she gets dizzy at times  described as out of balance; pt and daughter state she's been doing her ex's at home;     Patient is accompained by:  Family member    Limitations  Walking;House hold activities;Lifting;Standing;Writing;Reading    Patient Stated Goals  balance, walking better, get stronger, stairs    Currently in Pain?  No/denies    Multiple Pain Sites  No         [facility] PT Assessment - 04/26/18 0001      Transfers   Five time sit to stand comments   50 seconds no hands    Comments  supine to/from sit ; independent      Ambulation/Gait   Ambulation/Gait  Yes    Ambulation/Gait Assistance  5: Supervision    Ambulation Distance (Feet)  230 Feet    Assistive device  Rollator    Gait Pattern  Step-through pattern    Ambulation Surface  Level;Indoor    Gait  velocity  1.9 ft/sec      Berg Balance Test   Sit to Stand  Able to stand without using hands and stabilize independently    Standing Unsupported  Able to stand safely 2 minutes    Sitting with Back Unsupported but Feet Supported on Floor or Stool  Able to sit safely and securely 2 minutes    Stand to Sit  Sits safely with minimal use of hands    Transfers  Able to transfer with verbal cueing and /or supervision    Standing Unsupported with Eyes Closed  Able to stand 10 seconds with supervision    Standing Ubsupported with Feet Together  Able to place feet together independently and stand 1 minute safely    From Standing, Reach Forward with Outstretched Arm  Can reach forward >5 cm safely (2")    From Standing Position, Pick up Object from Floor  Unable to pick up shoe, but reaches 2-5 cm (1-2") from shoe and balances independently    From Standing Position, Turn to Look Behind Over each Shoulder  Looks behind one side only/other side shows less weight shift    Turn 360 Degrees  Able to turn 360 degrees safely  but slowly    Standing Unsupported, Alternately Place Feet on Step/Stool  Needs assistance to keep from falling or unable to try    Standing Unsupported, One Foot in Front  Needs help to step but can hold 15 seconds    Standing on One Leg  Tries to lift leg/unable to hold 3 seconds but remains standing independently    Total Score  36                   [facility] Adult PT Treatment/Exercise - 04/26/18 0001      Balance   Balance Assessed  Yes      Static Standing Balance   Single Leg Stance - Right Leg  --   needs UE support             PT Education - 04/26/18 1414    Education Details  Importance of daily HEP; recommneded they pursue getting a tub bench and a hand rail for the bed to improve safety with houshold transfers and importance of working on single leg ex's     Person(s) Educated  Patient    Methods  Explanation    Comprehension  Verbalized  understanding       PT Short Term Goals - 04/26/18 1404      PT SHORT TERM GOAL #1   Title  Pt will be independent in initial HEP.    Time  3    Period  Weeks    Status  On-going      PT SHORT TERM GOAL #2   Title  Pt will improve LE strength as indicated by five time sit to stance to </= 50 seconds without UE support     Baseline  1:12 from mat, pushed back on mat with back of legs    Time  4    Period  Weeks    Status  Achieved      PT SHORT TERM GOAL #3   Title  Pt will demonstrate safe transfers with supervision from sit to stand, stand to sit and all bed mobility tasks.    Time  4    Period  Weeks    Status  Partially Met      PT SHORT TERM GOAL #4   Title  Pt will improve BERG by 4 points    Baseline  34/56    Time  4    Period  Weeks    Status  Not Met      PT SHORT TERM GOAL #5   Title  Pt will improve gait velocity with RW to >/= 1.6 ft/sec    Baseline  1.29 ft/sec    Time  4    Period  Weeks    Status  Achieved        PT Long Term Goals - 03/31/18 1937      PT LONG TERM GOAL #1   Title  Pt and daughter will demonstrate independence with HEP    Time  8    Period  Weeks    Status  New    Target Date  05/21/18      PT LONG TERM GOAL #2   Title  Pt will perform community ambulation x 500 feet with least restrictive assistive device while negotiating a curb and ramp to improve safety and endurance in the community with supervision    Time  8    Period  Weeks    Status  New  Target Date  05/21/17      PT LONG TERM GOAL #3   Title  Pt will perform safe and efficient strategy for ascending/descending stairs with least restrictive assistive device so she will be able to negotiate flight of stairs to/from her apartment.    Time  8    Period  Weeks    Status  New    Target Date  05/21/17      PT LONG TERM GOAL #4   Title  Pt will improve five time sit to stand to </= 40 seconds without use of UE    Time  8    Period  Weeks    Status  New     Target Date  05/21/18      PT LONG TERM GOAL #5   Title  Pt will improve BERG by 8 points (from initial assessment)    Baseline  34/56    Time  8    Period  Weeks    Status  New    Target Date  05/21/18      PT LONG TERM GOAL #6   Title  Pt will improve gait velocity with rollator and appropriate AFO to >/= 2.2 ft/sec    Time  8    Period  Weeks    Status  New    Target Date  05/21/18            Plan - 04/26/18 1416    Clinical Impression Statement  Pt demonstrated inmprovement toward all the STG's; see assessment; need to continue to advance the HEP; recommend adding disassociation of head/body/eyes with the current ex's ;  pt made a comment at the end of session that warrants further investigation with possible postional vertigo with transfers;  she is still a high fall risk and requires UE support for any dynamic mvt with standing     Clinical Presentation  Evolving    Clinical Decision Making  Low    Rehab Potential  Good    Clinical Impairments Affecting Rehab Potential  four strokes in 10 years with significant change in status with recent stroke, PMH of MS, diabetes    PT Next Visit Plan  further assess comments about postional vertigo     Consulted and Agree with Plan of Care  Patient;Family member/caregiver       Patient will benefit from skilled therapeutic intervention in order to improve the following deficits and impairments:  Abnormal gait, Decreased coordination, Decreased range of motion, Difficulty walking, Impaired tone, Decreased endurance, Decreased safety awareness, Increased muscle spasms, Impaired UE functional use, Decreased activity tolerance, Impaired vision/preception, Decreased balance, Decreased knowledge of use of DME, Hypomobility, Improper body mechanics, Decreased cognition, Decreased mobility, Decreased strength  Visit Diagnosis: Other abnormalities of gait and mobility  Hemiplegia and hemiparesis following cerebral infarction affecting right  dominant side (HCC)  Difficulty in walking, not elsewhere classified     Problem List There are no active problems to display for this patient.   Rosaura Carpenter D  PT DPT  04/26/2018, 2:23 PM  [facility] [address] [place], [place], [address] Phone: [phone]   Fax:  [phone]  Name: Chyna Kneece MRN: [id number] Date of Birth: Jun 15, 1965

## 2018-04-28 ENCOUNTER — Ambulatory Visit: Payer: Medicare Other | Admitting: Physical Therapy

## 2018-04-28 ENCOUNTER — Encounter: Payer: Self-pay | Admitting: Physical Therapy

## 2018-04-28 DIAGNOSIS — I69351 Hemiplegia and hemiparesis following cerebral infarction affecting right dominant side: Secondary | ICD-10-CM

## 2018-04-28 DIAGNOSIS — R2689 Other abnormalities of gait and mobility: Secondary | ICD-10-CM

## 2018-04-28 DIAGNOSIS — R262 Difficulty in walking, not elsewhere classified: Secondary | ICD-10-CM

## 2018-04-28 NOTE — Therapy (Signed)
[facility] [address] [place], [place], [address] Phone: [phone]   Fax:  [phone]  Physical Therapy Treatment  Patient Details  Name: Hannah [name] MRN: [id number] Date of Birth: May 25, 1965 Referring Provider (PT): Via, Lennette Bihari, MD   Encounter Date: 04/28/2018  PT End of Session - 04/28/18 1546    Visit Number  7    Number of Visits  17    Date for PT Re-Evaluation  05/17/18    Authorization Type  UHC Medicare    PT Start Time  1320    PT Stop Time  1400    PT Time Calculation (min)  40 min    Activity Tolerance  Patient tolerated treatment well    Behavior During Therapy  [facility] for tasks assessed/performed       Past Medical History:  Diagnosis Date  . Cerebral infarction ([place]) 07/12/2017   2009 first one, has had a total of 4 stokes  . Diabetes type 2, controlled ([place])   . Multiple sclerosis ([place]) 2009    History reviewed. No pertinent surgical history.  There were no vitals filed for this visit.  Subjective Assessment - 04/28/18 1326    Subjective  Pt has been using her cane at home but family wants her to keep using rollator.  Pt does report dizziness with quick turns in bed, quick head/body turns and when she returns from leaning down    Patient is accompained by:  Family member    Limitations  Walking;House hold activities;Lifting;Standing;Writing;Reading    Patient Stated Goals  balance, walking better, get stronger, stairs    Currently in Pain?  No/denies             Vestibular Assessment - 04/28/18 1328      Vestibular Assessment   General Observation  had dizziness after stroke then it resolved.  Returned about a month ago      Symptom Behavior   Type of Dizziness  Imbalance    Frequency of Dizziness  intermittent    Duration of Dizziness  few seconds    Aggravating Factors  Turning body quickly;Turning head quickly;Rolling to right;Rolling to left;Forward bending    Relieving  Factors  No known relieving factors      Occulomotor Exam   Occulomotor Alignment  Abnormal   pt keeps head tilted to R   Spontaneous  Absent    Gaze-induced  Absent    Smooth Pursuits  Intact    Saccades  Slow    Comment  convergence impaired      Vestibulo-Occular Reflex   VOR Cancellation  Normal    Comment  HIT: - bilaterally      Positional Testing   Dix-Hallpike  Dix-Hallpike Right;Dix-Hallpike Left    Horizontal Canal Testing  Horizontal Canal Right;Horizontal Canal Left      Dix-Hallpike Right   Dix-Hallpike Right Duration  0    Dix-Hallpike Right Symptoms  No nystagmus      Dix-Hallpike Left   Dix-Hallpike Left Duration  0    Dix-Hallpike Left Symptoms  No nystagmus      Horizontal Canal Right   Horizontal Canal Right Duration  0    Horizontal Canal Right Symptoms  Normal      Horizontal Canal Left   Horizontal Canal Left Duration  0    Horizontal Canal Left Symptoms  Normal      Positional Sensitivities   Sit to Supine  No dizziness    Supine to Left  Side  No dizziness    Supine to Right Side  No dizziness    Supine to Sitting  Mild dizziness    Right Hallpike  No dizziness    Up from Right Hallpike  Mild dizziness    Up from Left Hallpike  No dizziness    Nose to Right Knee  No dizziness    Right Knee to Sitting  Mild dizziness    Nose to Left Knee  No dizziness    Left Knee to Sitting  No dizziness    Head Turning x 5  Lightheadedness    Head Nodding x 5  No dizziness    Pivot Right in Standing  Moderate dizziness    Pivot Left in Standing  No dizziness               [facility] Adult PT Treatment/Exercise - 04/28/18 1543      Ambulation/Gait   Ambulation/Gait  Yes    Ambulation/Gait Assistance  4: Min assist    Ambulation/Gait Assistance Details  trial of gait with small based quad cane with turning L and R around obstacles; therapist performed tactile cues to maintain upright posture and control of forward momentum due to forwards LOB.   Advised pt to continue with rollator at home for now and will continue to work on gait with cane in therapy sessions    Ambulation Distance (Feet)  115 Feet    Assistive device  Small based quad cane    Gait Pattern  Step-through pattern;Decreased step length - right;Decreased step length - left;Decreased stride length;Trunk flexed    Ambulation Surface  Level;Indoor             PT Education - 04/28/18 1545    Education Details  vestibular findings of dysequilibrium; continue to use rollator in home and will continue cane in therapy    Person(s) Educated  Patient;Other (comment)   friend   Methods  Explanation    Comprehension  Verbalized understanding       PT Short Term Goals - 04/28/18 1548      PT SHORT TERM GOAL #1   Title  Pt will be independent in initial HEP.    Time  3    Period  Weeks    Status  On-going      PT SHORT TERM GOAL #2   Title  Pt will improve LE strength as indicated by five time sit to stance to </= 50 seconds without UE support     Baseline  50 seconds    Time  4    Period  Weeks    Status  Achieved      PT SHORT TERM GOAL #3   Title  Pt will demonstrate safe transfers with supervision from sit to stand, stand to sit and all bed mobility tasks.    Time  4    Period  Weeks    Status  Partially Met      PT SHORT TERM GOAL #4   Title  Pt will improve BERG by 4 points    Baseline  34/56 > 36/56    Time  4    Period  Weeks    Status  Partially Met      PT SHORT TERM GOAL #5   Title  Pt will improve gait velocity with RW to >/= 1.6 ft/sec    Baseline  1.9 ft/sec with rollator    Time  4    Period  Weeks  Status  Achieved        PT Long Term Goals - 04/28/18 1550      PT LONG TERM GOAL #1   Title  Pt and daughter will demonstrate independence with HEP (05/21/2018 for all LTG)    Time  8    Period  Weeks    Status  New      PT LONG TERM GOAL #2   Title  Pt will perform community ambulation x 500 feet with least restrictive  assistive device while negotiating a curb and ramp to improve safety and endurance in the community with supervision    Time  8    Period  Weeks    Status  New      PT LONG TERM GOAL #3   Title  Pt will perform safe and efficient strategy for ascending/descending stairs with least restrictive assistive device so she will be able to negotiate flight of stairs to/from her apartment.    Time  8    Period  Weeks    Status  New      PT LONG TERM GOAL #4   Title  Pt will improve five time sit to stand to </= 40 seconds without use of UE    Baseline  50 sec    Time  8    Period  Weeks    Status  New      PT LONG TERM GOAL #5   Title  Pt will improve BERG by 8 points (from initial assessment)    Baseline  34/56 > 36/56    Time  8    Period  Weeks    Status  New      PT LONG TERM GOAL #6   Title  Pt will improve gait velocity with rollator and appropriate AFO to >/= 2.2 ft/sec    Baseline  1.9 at 4 weeks    Time  8    Period  Weeks    Status  New            Plan - 04/28/18 1546    Clinical Impression Statement  Due to ongoing reports of "dizziness" performed full vestibular evaluation with pt demonstrating negative positional testing.  Pt does demonstrate motion sensitivity and dysequilibrium, especially with pivoting/turning.  Initiated gait assessment and training with quad cane due to pt's goal of using cane at home.  Pt requires min A currently for safe ambulation with cane; will continue to address in therapy but did advised pt to continue to use rollator at home    Rehab Potential  Good    Clinical Impairments Affecting Rehab Potential  four strokes in 10 years with significant change in status with recent stroke, PMH of MS, diabetes    PT Frequency  2x / week    PT Duration  8 weeks    PT Treatment/Interventions  ADLs/Self Care Home Management;DME Instruction;Gait training;Stair training;Functional mobility training;Therapeutic activities;Therapeutic exercise;Balance  training;Neuromuscular re-education;Cognitive remediation;Patient/family education;Orthotic Fit/Training;Manual techniques;Passive range of motion;Energy conservation;Splinting;Visual/perceptual remediation/compensation    PT Next Visit Plan  continued to gait train with quad cane focusing on control of forward momentum; may need to revise HEP.  Balance with turns without UE support    PT Home Exercise Plan  [email or web address]     Consulted and Agree with Plan of Care  Patient;Family member/caregiver       Patient will benefit from skilled therapeutic intervention in order to improve the following deficits and impairments:  Abnormal gait, Decreased coordination,  Decreased range of motion, Difficulty walking, Impaired tone, Decreased endurance, Decreased safety awareness, Increased muscle spasms, Impaired UE functional use, Decreased activity tolerance, Impaired vision/preception, Decreased balance, Decreased knowledge of use of DME, Hypomobility, Improper body mechanics, Decreased cognition, Decreased mobility, Decreased strength  Visit Diagnosis: Other abnormalities of gait and mobility  Difficulty in walking, not elsewhere classified  Hemiplegia and hemiparesis following cerebral infarction affecting right dominant side ([place])     Problem List There are no active problems to display for this patient.  Rico Junker, PT, DPT 04/28/18    3:55 PM    [facility] [address] [place], [place], [address] Phone: [phone]   Fax:  [phone]  Name: Hannah [name] MRN: [id number] Date of Birth: November 20, 1965

## 2018-05-03 ENCOUNTER — Ambulatory Visit: Payer: Medicare Other | Admitting: Occupational Therapy

## 2018-05-03 ENCOUNTER — Ambulatory Visit: Payer: Medicare Other | Admitting: Physical Therapy

## 2018-05-05 ENCOUNTER — Ambulatory Visit: Payer: Medicare Other | Admitting: Physical Therapy

## 2018-05-05 ENCOUNTER — Encounter: Payer: Self-pay | Admitting: Physical Therapy

## 2018-05-05 DIAGNOSIS — R2689 Other abnormalities of gait and mobility: Secondary | ICD-10-CM | POA: Diagnosis not present

## 2018-05-05 DIAGNOSIS — I69351 Hemiplegia and hemiparesis following cerebral infarction affecting right dominant side: Secondary | ICD-10-CM

## 2018-05-05 DIAGNOSIS — R262 Difficulty in walking, not elsewhere classified: Secondary | ICD-10-CM

## 2018-05-06 ENCOUNTER — Telehealth: Payer: Self-pay | Admitting: Physical Therapy

## 2018-05-06 NOTE — Telephone Encounter (Signed)
Contacted patient's new PCP, Harlene Ramus, MD, via fax for order for SLP evaluation and treatment due to multiple CVA.   Rico Junker, PT, DPT 05/06/18    3:42 PM

## 2018-05-06 NOTE — Therapy (Signed)
[facility] [address] [place], [place], [address] Phone: [phone]   Fax:  [phone]  Physical Therapy Treatment  Patient Details  Name: Hannah [name] MRN: [id number] Date of Birth: 07/04/1965 Referring Provider (PT): Harlene Ramus, MD (changed PCP)   Encounter Date: 05/05/2018  PT End of Session - 05/06/18 1528    Visit Number  8    Number of Visits  17    Date for PT Re-Evaluation  05/17/18    Authorization Type  UHC Medicare    PT Start Time  1318    PT Stop Time  1404    PT Time Calculation (min)  46 min    Activity Tolerance  Patient tolerated treatment well    Behavior During Therapy  [facility] for tasks assessed/performed       Past Medical History:  Diagnosis Date  . Cerebral infarction ([place]) 07/12/2017   2009 first one, has had a total of 4 stokes  . Diabetes type 2, controlled ([place])   . Multiple sclerosis ([place]) 2009    History reviewed. No pertinent surgical history.  There were no vitals filed for this visit.  Subjective Assessment - 05/05/18 1323    Subjective  Went to the dentist today and has to have some teeth pulled by a surgeon.  Has had a UTI for > a month but was started on antibiotic x 2 days.  Doctor told her the UTI can make her stroke symptoms worse.    Patient is accompained by:  Family member    Pertinent History  4th stroke, MS, diabetic    Limitations  Walking;House hold activities;Lifting;Standing;Writing;Reading    Patient Stated Goals  balance, walking better, get stronger, stairs    Currently in Pain?  No/denies                          [facility]) - 05/05/18 1401    PWR Step  Standing at counter top stepping to L and R x 5 reps each side.  Progressed to two part, 180 turn with stepping to L and R with min A.      Comments  --       Balance Exercises - 05/05/18 1329      Balance Exercises: Standing   SLS  Eyes open;Upper extremity support 1;Upper  extremity support 2;3 reps   on rockerboard   Rockerboard  Anterior/posterior;EO;10 reps;UE support;Other (comment)   with and without UE support; diagonal weight shifts       PT Education - 05/06/18 1527    Education Details  weight shifting and rotation to improve stability with turning    Person(s) Educated  Patient;Child(ren)    Methods  Explanation;Demonstration    Comprehension  Verbalized understanding;Returned demonstration       PT Short Term Goals - 04/28/18 1548      PT SHORT TERM GOAL #1   Title  Pt will be independent in initial HEP.    Time  3    Period  Weeks    Status  On-going      PT SHORT TERM GOAL #2   Title  Pt will improve LE strength as indicated by five time sit to stance to </= 50 seconds without UE support     Baseline  50 seconds    Time  4    Period  Weeks    Status  Achieved      PT SHORT TERM  GOAL #3   Title  Pt will demonstrate safe transfers with supervision from sit to stand, stand to sit and all bed mobility tasks.    Time  4    Period  Weeks    Status  Partially Met      PT SHORT TERM GOAL #4   Title  Pt will improve BERG by 4 points    Baseline  34/56 > 36/56    Time  4    Period  Weeks    Status  Partially Met      PT SHORT TERM GOAL #5   Title  Pt will improve gait velocity with RW to >/= 1.6 ft/sec    Baseline  1.9 ft/sec with rollator    Time  4    Period  Weeks    Status  Achieved        PT Long Term Goals - 04/28/18 1550      PT LONG TERM GOAL #1   Title  Pt and daughter will demonstrate independence with HEP (05/21/2018 for all LTG)    Time  8    Period  Weeks    Status  New      PT LONG TERM GOAL #2   Title  Pt will perform community ambulation x 500 feet with least restrictive assistive device while negotiating a curb and ramp to improve safety and endurance in the community with supervision    Time  8    Period  Weeks    Status  New      PT LONG TERM GOAL #3   Title  Pt will perform safe and efficient  strategy for ascending/descending stairs with least restrictive assistive device so she will be able to negotiate flight of stairs to/from her apartment.    Time  8    Period  Weeks    Status  New      PT LONG TERM GOAL #4   Title  Pt will improve five time sit to stand to </= 40 seconds without use of UE    Baseline  50 sec    Time  8    Period  Weeks    Status  New      PT LONG TERM GOAL #5   Title  Pt will improve BERG by 8 points (from initial assessment)    Baseline  34/56 > 36/56    Time  8    Period  Weeks    Status  New      PT LONG TERM GOAL #6   Title  Pt will improve gait velocity with rollator and appropriate AFO to >/= 2.2 ft/sec    Baseline  1.9 at 4 weeks    Time  8    Period  Weeks    Status  New            Plan - 05/06/18 1532    Clinical Impression Statement  Due to patient's goal to ambulate with a cane, continued to focus on dynamic standing balance training on unstable surface - rockerboard.  Focused on ankle and hip strategies to control forwards and backwards weight shifting.  Transitioned to standing on the floor and performing PWR steps at counter top to improve trunk and LE rotation to improve stability with turns.  Will continue to address to progress towards LTG.    Rehab Potential  Good    Clinical Impairments Affecting Rehab Potential  four strokes in 10 years with significant change  in status with recent stroke, PMH of MS, diabetes    PT Frequency  2x / week    PT Duration  8 weeks    PT Treatment/Interventions  ADLs/Self Care Home Management;DME Instruction;Gait training;Stair training;Functional mobility training;Therapeutic activities;Therapeutic exercise;Balance training;Neuromuscular re-education;Cognitive remediation;Patient/family education;Orthotic Fit/Training;Manual techniques;Passive range of motion;Energy conservation;Splinting;Visual/perceptual remediation/compensation    PT Next Visit Plan  Check LTG and recertify.  Has SLP order  come in? continued to gait train with quad cane focusing on control of forward momentum; may need to revise HEP.  Balance with turns without UE support - PWR exercises for rotation!!    PT Home Exercise Plan  [email or web address]     Consulted and Agree with Plan of Care  Patient;Family member/caregiver       Patient will benefit from skilled therapeutic intervention in order to improve the following deficits and impairments:  Abnormal gait, Decreased coordination, Decreased range of motion, Difficulty walking, Impaired tone, Decreased endurance, Decreased safety awareness, Increased muscle spasms, Impaired UE functional use, Decreased activity tolerance, Impaired vision/preception, Decreased balance, Decreased knowledge of use of DME, Hypomobility, Improper body mechanics, Decreased cognition, Decreased mobility, Decreased strength  Visit Diagnosis: Other abnormalities of gait and mobility  Difficulty in walking, not elsewhere classified  Hemiplegia and hemiparesis following cerebral infarction affecting right dominant side ([place])     Problem List There are no active problems to display for this patient.   Rico Junker, PT, DPT 05/06/18    3:39 PM    [facility] [address] [place], [place], [address] Phone: [phone]   Fax:  [phone]  Name: Hannah [name] MRN: [id number] Date of Birth: 03-30-1966

## 2018-05-08 ENCOUNTER — Ambulatory Visit: Payer: Medicare Other | Admitting: Physical Therapy

## 2018-05-16 ENCOUNTER — Ambulatory Visit: Payer: Medicare Other | Admitting: Occupational Therapy

## 2018-05-16 ENCOUNTER — Ambulatory Visit: Payer: Medicare Other | Admitting: Physical Therapy

## 2018-05-19 ENCOUNTER — Ambulatory Visit: Payer: Medicare Other | Admitting: Physical Therapy

## 2018-05-24 ENCOUNTER — Ambulatory Visit: Payer: Medicare Other | Attending: Family Medicine | Admitting: Physical Therapy

## 2018-05-24 ENCOUNTER — Ambulatory Visit: Payer: Medicare Other | Admitting: Occupational Therapy

## 2018-05-24 DIAGNOSIS — R278 Other lack of coordination: Secondary | ICD-10-CM | POA: Insufficient documentation

## 2018-05-24 DIAGNOSIS — R42 Dizziness and giddiness: Secondary | ICD-10-CM | POA: Insufficient documentation

## 2018-05-24 DIAGNOSIS — M25621 Stiffness of right elbow, not elsewhere classified: Secondary | ICD-10-CM | POA: Insufficient documentation

## 2018-05-24 DIAGNOSIS — M6281 Muscle weakness (generalized): Secondary | ICD-10-CM | POA: Insufficient documentation

## 2018-05-24 DIAGNOSIS — R262 Difficulty in walking, not elsewhere classified: Secondary | ICD-10-CM | POA: Insufficient documentation

## 2018-05-24 DIAGNOSIS — R41842 Visuospatial deficit: Secondary | ICD-10-CM | POA: Insufficient documentation

## 2018-05-24 DIAGNOSIS — R208 Other disturbances of skin sensation: Secondary | ICD-10-CM | POA: Insufficient documentation

## 2018-05-24 DIAGNOSIS — R29818 Other symptoms and signs involving the nervous system: Secondary | ICD-10-CM | POA: Insufficient documentation

## 2018-05-24 DIAGNOSIS — R2681 Unsteadiness on feet: Secondary | ICD-10-CM | POA: Insufficient documentation

## 2018-05-24 DIAGNOSIS — I69351 Hemiplegia and hemiparesis following cerebral infarction affecting right dominant side: Secondary | ICD-10-CM | POA: Insufficient documentation

## 2018-05-24 DIAGNOSIS — M25611 Stiffness of right shoulder, not elsewhere classified: Secondary | ICD-10-CM | POA: Insufficient documentation

## 2018-05-24 DIAGNOSIS — R2689 Other abnormalities of gait and mobility: Secondary | ICD-10-CM | POA: Insufficient documentation

## 2018-05-24 DIAGNOSIS — I69315 Cognitive social or emotional deficit following cerebral infarction: Secondary | ICD-10-CM | POA: Insufficient documentation

## 2018-05-26 ENCOUNTER — Ambulatory Visit: Payer: Medicare Other | Admitting: Occupational Therapy

## 2018-05-26 ENCOUNTER — Ambulatory Visit: Payer: Medicare Other | Admitting: Physical Therapy

## 2018-05-31 ENCOUNTER — Telehealth: Payer: Self-pay | Admitting: Neurology

## 2018-05-31 ENCOUNTER — Encounter: Payer: Self-pay | Admitting: Neurology

## 2018-05-31 ENCOUNTER — Encounter: Payer: Medicare Other | Admitting: Occupational Therapy

## 2018-05-31 ENCOUNTER — Ambulatory Visit: Payer: Medicare Other | Admitting: Neurology

## 2018-05-31 VITALS — BP 130/81 | HR 72 | Ht 62.0 in | Wt 147.0 lb

## 2018-05-31 DIAGNOSIS — R269 Unspecified abnormalities of gait and mobility: Secondary | ICD-10-CM

## 2018-05-31 DIAGNOSIS — G8110 Spastic hemiplegia affecting unspecified side: Secondary | ICD-10-CM | POA: Diagnosis not present

## 2018-05-31 DIAGNOSIS — G35 Multiple sclerosis: Secondary | ICD-10-CM

## 2018-05-31 NOTE — Patient Instructions (Signed)
I had a long discussion with the patient and her daughter regarding her multiple strokes and residual spastic hemiparesis and gait ataxia as well as remote history of possible multiple sclerosis and answered questions.  I recommend reviewing prior medical records from [facility] and [facility] and we will send for these.  Check MRI scan of the brain with and without contrast and MRI of the cervical spine with and without contrast to look at her disease activity of MS.  If the disease is active may need to consider starting disease modifying agents.  Continue Plavix for stroke prevention with strict control of hypertension with blood pressure goal below 130/90, lipids with LDL cholesterol goal below 70 mg percent and diabetes with hemoglobin A1c goal below 6.5%.  Continue ongoing outpatient physical and occupational therapy.  We also discussed fall and safety precautions.  She will return for follow-up in 2 months or call earlier if necessary.  Fall Prevention in the Home, Adult Falls can cause injuries and can affect people from all age groups. There are many simple things that you can do to make your home safe and to help prevent falls. Ask for help when making these changes, if needed. What actions can I take to prevent falls? General instructions  Use good lighting in all rooms. Replace any light bulbs that burn out.  Turn on lights if it is dark. Use night-lights.  Place frequently used items in easy-to-reach places. Lower the shelves around your home if necessary.  Set up furniture so that there are clear paths around it. Avoid moving your furniture around.  Remove throw rugs and other tripping hazards from the floor.  Avoid walking on wet floors.  Fix any uneven floor surfaces.  Add color or contrast paint or tape to grab bars and handrails in your home. Place contrasting color strips on the first and last steps of stairways.  When you use a stepladder, make sure that  it is completely opened and that the sides are firmly locked. Have someone hold the ladder while you are using it. Do not climb a closed stepladder.  Be aware of any and all pets. What can I do in the bathroom?      Keep the floor dry. Immediately clean up any water that spills onto the floor.  Remove soap buildup in the tub or shower on a regular basis.  Use non-skid mats or decals on the floor of the tub or shower.  Attach bath mats securely with double-sided, non-slip rug tape.  If you need to sit down while you are in the shower, use a plastic, non-slip stool.  Install grab bars by the toilet and in the tub and shower. Do not use towel bars as grab bars. What can I do in the bedroom?  Make sure that a bedside light is easy to reach.  Do not use oversized bedding that drapes onto the floor.  Have a firm chair that has side arms to use for getting dressed. What can I do in the kitchen?  Clean up any spills right away.  If you need to reach for something above you, use a sturdy step stool that has a grab bar.  Keep electrical cables out of the way.  Do not use floor polish or wax that makes floors slippery. If you must use wax, make sure that it is non-skid floor wax. What can I do in the stairways?  Do not leave any items on the stairs.  Make sure that you have a light switch at the top of the stairs and the bottom of the stairs. Have them installed if you do not have them.  Make sure that there are handrails on both sides of the stairs. Fix handrails that are broken or loose. Make sure that handrails are as long as the stairways.  Install non-slip stair treads on all stairs in your home.  Avoid having throw rugs at the top or bottom of stairways, or secure the rugs with carpet tape to prevent them from moving.  Choose a carpet design that does not hide the edge of steps on the stairway.  Check any carpeting to make sure that it is firmly attached to the stairs. Fix  any carpet that is loose or worn. What can I do on the outside of my home?  Use bright outdoor lighting.  Regularly repair the edges of walkways and driveways and fix any cracks.  Remove high doorway thresholds.  Trim any shrubbery on the main path into your home.  Regularly check that handrails are securely fastened and in good repair. Both sides of any steps should have handrails.  Install guardrails along the edges of any raised decks or porches.  Clear walkways of debris and clutter, including tools and rocks.  Have leaves, snow, and ice cleared regularly.  Use sand or salt on walkways during winter months.  In the garage, clean up any spills right away, including grease or oil spills. What other actions can I take?  Wear closed-toe shoes that fit well and support your feet. Wear shoes that have rubber soles or low heels.  Use mobility aids as needed, such as canes, walkers, scooters, and crutches.  Review your medicines with your health care provider. Some medicines can cause dizziness or changes in blood pressure, which increase your risk of falling. Talk with your health care provider about other ways that you can decrease your risk of falls. This may include working with a physical therapist or trainer to improve your strength, balance, and endurance. Where to find more information  Centers for Disease Control and Prevention, [employer]: WebmailGuide.co.za  [employer] on Aging: BrainJudge.co.uk Contact a health care provider if:  You are afraid of falling at home.  You feel weak, drowsy, or dizzy at home.  You fall at home. Summary  There are many simple things that you can do to make your home safe and to help prevent falls.  Ways to make your home safe include removing tripping hazards and installing grab bars in the bathroom.  Ask for help when making these changes in your home. This information is not intended to replace advice given to you  by your health care provider. Make sure you discuss any questions you have with your health care provider. Document Released: 04/23/2002 Document Revised: 12/16/2016 Document Reviewed: 12/16/2016 Elsevier Interactive Patient Education  2019 [employer].

## 2018-05-31 NOTE — Progress Notes (Addendum)
[facility] [address] [place]. [place] [address] [phone]       OFFICE CONSULT NOTE  Hannah. Hannah [name] Date of Birth:  31-Dec-1965 Medical Record Number:  [id number]   Referring MD:  Lennette Bihari Via  Reason for Referral:  Stroke  HPI: Hannah [name] is a 53 year old African-American lady seen today for initial office consultation visit.  She is accompanied by her daughter.  History is obtained from them and review of referral notes and care everywhere.  Patient has recently moved from [facility] to be closer to her daughter who lives here and is here to transfer her neurological care.  Patient states that she has had multiple strokes.  The first 1 occurred in [place], [place] in 2001 when she had some slurred speech and right hemiparesis.  She apparently did recover well from that and was able to ambulate independently without residual defects.  Second 1 occurred in August 2018 when she was admitted to [facility] with similar findings.  Third 1 occurred in February 2019 when she states she had cerebellar infarcts.  Following this she is had gait and balance difficulties and is now using a walker for ambulation.  She states her balance is poor but she has had no major falls or injuries.  Review of records in care everywhere at [facility] states that CT angiogram of the brain and neck were done on 07/10/2017 and were both unremarkable.  CT scan of the head on 08/30/2017 showed no acute abnormalities.  Lab work on 02/20/2018 showed hemoglobin A1c to be elevated at 13.0 and LDL cholesterol was 47 mg percent.  Patient states she has had no further stroke or TIA symptoms.  She is currently on Plavix which is tolerating well with only minor bruising and no bleeding.  She states her blood pressure is under good control and today it is 130/81.  She is working towards getting her diabetes under better control and is seeing her primary care physician for the same.  She states she was  also diagnosed with multiple sclerosis in the past.  While she was a teenager she had episode of vision loss as well as gait and balance difficulties.  She remembers having a spinal tap and an MRI and the diagnosis of Hannah was made however she states she did see a neurologist but was never started on disease modifying drugs.  I do not have any records about these testing available for my review today.  She denies current bladder urgency.  She does have diminished vision acuity but this may be related to diabetes and she is seeing ophthalmologist for the same.  I do not have those records.  The patient is currently participating in outpatient physical occupational therapy and feels it is helping however due to cold and flulike symptoms she has missed a few recent appointments.  ROS:   14 system review of systems is positive for weight gain, fatigue, loss of vision, cough, incontinence, urinary problems, increased thirst, aching muscles, weakness, headache, confusion, memory loss and all other systems negative PMH:  Past Medical History:  Diagnosis Date  . Cerebral infarction ([place]) 07/12/2017   2009 first one, has had a total of 4 stokes  . Diabetes type 2, controlled ([place])   . Hemiparesis affecting right side as late effect of cerebrovascular accident (CVA) ([place])   . Hyperlipidemia   . Hypertension   . Multiple sclerosis ([place]) 2009  . Stroke [facility])     Social History:  Social  History   Socioeconomic History  . Marital status: Divorced    Spouse name: Not on file  . Number of children: Not on file  . Years of education: Not on file  . Highest education level: Not on file  Occupational History  . Not on file  Social Needs  . Financial resource strain: Not on file  . Food insecurity:    Worry: Not on file    Inability: Not on file  . Transportation needs:    Medical: Not on file    Non-medical: Not on file  Tobacco Use  . Smoking status: Never Smoker  . Smokeless tobacco: Never Used    Substance and Sexual Activity  . Alcohol use: Not Currently  . Drug use: Not Currently  . Sexual activity: Not on file  Lifestyle  . Physical activity:    Days per week: Not on file    Minutes per session: Not on file  . Stress: Not on file  Relationships  . Social connections:    Talks on phone: Not on file    Gets together: Not on file    Attends religious service: Not on file    Active member of club or organization: Not on file    Attends meetings of clubs or organizations: Not on file    Relationship status: Not on file  . Intimate partner violence:    Fear of current or ex partner: Not on file    Emotionally abused: Not on file    Physically abused: Not on file    Forced sexual activity: Not on file  Other Topics Concern  . Not on file  Social History Narrative  . Not on file    Medications:   Current Outpatient Medications on File Prior to Visit  Medication Sig Dispense Refill  . amLODipine (NORVASC) 10 MG tablet Take 10 mg by mouth daily.    Marland Kitchen aspirin 81 MG chewable tablet Chew by mouth.    . clopidogrel (PLAVIX) 75 MG tablet     . famotidine (PEPCID) 40 MG tablet     . FLUoxetine (PROZAC) 20 MG capsule     . glipiZIDE (GLUCOTROL) 10 MG tablet TAKE 1 TABLET BY MOUTH TWICE DAILY BEFORE  MEALS.    . hydrochlorothiazide (HYDRODIURIL) 25 MG tablet     . Insulin Degludec (TRESIBA [place]) Inject 35-40 Units into the skin at bedtime.    . insulin lispro (HUMALOG KWIKPEN) 100 UNIT/ML KwikPen Inject into the skin.    Marland Kitchen lisinopril (PRINIVIL,ZESTRIL) 40 MG tablet Take by mouth.    . metoprolol tartrate (LOPRESSOR) 100 MG tablet Take 100 mg by mouth 2 (two) times daily.    . nitroGLYCERIN (NITROSTAT) 0.4 MG SL tablet DISSOLVE ONE TABLET UNDER THE TONGUE EVERY 5 MINUTES AS NEEDED FOR CHEST PAIN.  DO NOT EXCEED A TOTAL OF 3 DOSES IN 15 MINUTES    . rosuvastatin (CRESTOR) 20 MG tablet Take by mouth.     No current facility-administered medications on file prior to visit.      Allergies:   Allergies  Allergen Reactions  . Ibuprofen Other (See Comments) and Swelling    Edema Swelling to hands, feet and face   . Influenza Vaccines Anaphylaxis    Swelling, fever  . Pneumococcal Vaccine Anaphylaxis    Swelling, fever Swelling, fever   . Promethazine Other (See Comments)    hallucinations hallucinations   . Amlodipine Swelling  . Cyclobenzaprine Other (See Comments)    hallucinations   .  Oxycodone Nausea Only and Nausea And Vomiting    Physical Exam General: Frail middle-aged African-American lady, seated, in no evident distress Head: head normocephalic and atraumatic.   Neck: supple with no carotid or supraclavicular bruits Cardiovascular: regular rate and rhythm, no murmurs Musculoskeletal: no deformity Skin:  no rash/petichiae Vascular:  Normal pulses all extremities  Neurologic Exam Mental Status: Awake and fully alert. Oriented to place and time. Recent and remote memory intact. Attention span, concentration and fund of knowledge appropriate. Mood and affect appropriate.  Jaw jerk is brisk. Cranial Nerves: Fundoscopic exam reveals sharp disc margins. Pupils equal, sluggishly reactive to light.  No definite afferent pupillary defect.  Vision acuity diminished bilaterally 20/70. Extraocular movements full without nystagmus but saccadic dysmetria on horizontal gaze right greater than left. Visual fields full to confrontation. Hearing intact. Facial sensation intact.  Moderate right lower facial weakness., tongue, palate moves normally and symmetrically.  Motor: Spastic right hemiparesis 4/5 strength with weakness of right grip, intrinsic hand muscles, hip flexors and ankle dorsiflexors.  Tone is increased bilaterally greater on the right than the left.  Normal strength on the left side.  Sensory.: intact to touch , pinprick , position and vibratory sensation.  Coordination: Rapid alternating movements are diminished on the right and normal on the  left.. Finger-to-nose and heel-to-shin is impaired on the right and normal on the left. Gait and Station: Broad-based ataxic spastic hemiplegic gait with dragging of the right leg.  Uses a wheeled walker unsteady on a narrow-base and unable to stand on either foot unsupported. Reflexes: 3+ and asymmetric and brisker on the right. Toes downgoing.   NIHSS 3 Modified Rankin 3   ASSESSMENT: 53 year old African-American lady with remote history of multiple left brain subcortical infarcts with residual spastic right hemiparesis and mild pseudobulbar state who also has interestingly remote history of multiple sclerosis but apparently has not been on treatment.  I do not have any of her prior records today to either confirm or refute this diagnosis.     PLAN: I had a long discussion with the patient and her daughter regarding her multiple strokes and residual spastic hemiparesis and gait ataxia as well as remote history of possible multiple sclerosis and answered questions.  I recommend reviewing prior medical records from [place] and [facility] and we will send for these.  Check MRI scan of the brain with and without contrast and MRI of the cervical spine with and without contrast to look at her disease activity of Hannah.  If the disease is active may need to consider starting disease modifying agents.  Continue Plavix for stroke prevention with strict control of hypertension with blood pressure goal below 130/90, lipids with LDL cholesterol goal below 70 mg percent and diabetes with hemoglobin A1c goal below 6.5%.  Continue ongoing outpatient physical and occupational therapy.  We also discussed fall and safety precautions.  We will send for prior neurological records from   [facility] and [place] greater than 50% time during this 60-minute prolonged consultation visit was spent on counseling and coordination of care about her multiple neurological issues including  remote stroke, multiple sclerosis and discussion about treatment and evaluation plan and answering questions.  She will return for follow-up in 2 months or call earlier if necessary. Antony Contras, MD  [facility] [address] [place], [place] [address]  Phone [phone] Fax [phone] Note: This document was prepared with digital dictation and possible smart phrase technology. Any transcriptional  errors that result from this process are unintentional.  ADDENDUM : I have received prior medical records on this patient from  [facility] for admission for stroke from 01/06/2016 till 01/09/2016.  She was admitted with worsening of slurred speech and right-sided weakness with NIH stroke scale of 6.  MRI scan of the brain showed subacute enhancing right pontine infarct with old left lentiform nucleus, internal capsule, left cerebral peduncle wallerian degeneration and left pontine and left cerebellar old lacunar infarcts.  Punctate focus of old hemorrhage was noted in the right putamen possibly a cavernoma.  Extracranial MRA showed mild narrowing of internal carotid artery without significant stenosis.  Transthoracic echo showed concentric left ventricular hypertrophy with ejection fraction 65 to 70%.  She was continued on Plavix and aspirin was added and home statin and blood pressure medications were continued.  Hemoglobin A1c was 13, LDL cholesterol was 55 mg percent.  And outpatient endocrine consult was recommended.  Patient had prior history of multiple sclerosis and brain MRI did show periventricular and subcortical white matter changes however comparison with previous MRI films from [place] could not be obtained.  Antony Contras, MD

## 2018-05-31 NOTE — Telephone Encounter (Signed)
[facility] Medicare order sent to [facility] for pt to be aware, left GI phone number of [phone] and to give them a call if she has not heard from them in the next 2-3 business days.

## 2018-06-02 ENCOUNTER — Encounter: Payer: Medicare Other | Admitting: Occupational Therapy

## 2018-06-06 ENCOUNTER — Other Ambulatory Visit: Payer: Self-pay

## 2018-06-06 ENCOUNTER — Ambulatory Visit: Payer: Medicare Other | Admitting: Occupational Therapy

## 2018-06-06 ENCOUNTER — Encounter: Payer: Self-pay | Admitting: Occupational Therapy

## 2018-06-06 DIAGNOSIS — R41842 Visuospatial deficit: Secondary | ICD-10-CM | POA: Diagnosis present

## 2018-06-06 DIAGNOSIS — I69351 Hemiplegia and hemiparesis following cerebral infarction affecting right dominant side: Secondary | ICD-10-CM

## 2018-06-06 DIAGNOSIS — R208 Other disturbances of skin sensation: Secondary | ICD-10-CM

## 2018-06-06 DIAGNOSIS — R278 Other lack of coordination: Secondary | ICD-10-CM

## 2018-06-06 DIAGNOSIS — M25621 Stiffness of right elbow, not elsewhere classified: Secondary | ICD-10-CM

## 2018-06-06 DIAGNOSIS — I69315 Cognitive social or emotional deficit following cerebral infarction: Secondary | ICD-10-CM

## 2018-06-06 DIAGNOSIS — R2681 Unsteadiness on feet: Secondary | ICD-10-CM | POA: Diagnosis present

## 2018-06-06 DIAGNOSIS — M25611 Stiffness of right shoulder, not elsewhere classified: Secondary | ICD-10-CM | POA: Diagnosis present

## 2018-06-06 DIAGNOSIS — M6281 Muscle weakness (generalized): Secondary | ICD-10-CM

## 2018-06-06 DIAGNOSIS — R29818 Other symptoms and signs involving the nervous system: Secondary | ICD-10-CM | POA: Diagnosis present

## 2018-06-06 DIAGNOSIS — R42 Dizziness and giddiness: Secondary | ICD-10-CM | POA: Diagnosis present

## 2018-06-06 DIAGNOSIS — R262 Difficulty in walking, not elsewhere classified: Secondary | ICD-10-CM | POA: Diagnosis present

## 2018-06-06 DIAGNOSIS — R2689 Other abnormalities of gait and mobility: Secondary | ICD-10-CM | POA: Diagnosis present

## 2018-06-06 NOTE — Therapy (Signed)
[facility] [address] [place], [place], [address] Phone: [phone]   Fax:  [phone]  Occupational Therapy Evaluation  Patient Details  Name: Hannah [name] MRN: [id number] Date of Birth: December 13, 1965 Referring Provider (OT): Lennette Bihari Via   Encounter Date: 06/06/2018  OT End of Session - 06/06/18 1055    Visit Number  1    Number of Visits  17    Authorization Type  UHC Medicare    Authorization Time Period  PN every 10th visit    Authorization - Visit Number  1    Authorization - Number of Visits  10    OT Start Time  0940    OT Stop Time  1030    OT Time Calculation (min)  50 min    Behavior During Therapy  [facility] for tasks assessed/performed       Past Medical History:  Diagnosis Date  . Cerebral infarction ([place]) 07/12/2017   2009 first one, has had a total of 4 stokes  . Diabetes type 2, controlled ([place])   . Hemiparesis affecting right side as late effect of cerebrovascular accident (CVA) ([place])   . Hyperlipidemia   . Hypertension   . Multiple sclerosis ([place]) 2009  . Stroke [facility])     Past Surgical History:  Procedure Laterality Date  . APPENDECTOMY    . CYSTOSTOMY W/ BLADDER BIOPSY    . OVARIAN CYST SURGERY    . stent placed in heart      There were no vitals filed for this visit.  Subjective Assessment - 06/06/18 0947    Subjective   This hand needs to be a little stronger.  I need to try and keep my balance    Patient is accompained by:  Family member   daughter Vella Redhead   Pertinent History  Prior strokes with Right hemiparesis, cerebellar strokes Feb 2019, MS    Currently in Pain?  No/denies    Multiple Pain Sites  No        [facility] OT Assessment - 06/06/18 0001      Assessment   Medical Diagnosis  R spastic Hemiparesis    Referring Provider (OT)  Lennette Bihari Via    Onset Date/Surgical Date  07/13/17    Hand Dominance  Right      Precautions   Precautions  Fall    Precaution Comments  ataxic  gait      Restrictions   Weight Bearing Restrictions  No      Balance Screen   Has the patient fallen in the past 6 months  Yes    How many times?  1      Prior Function   Level of Independence  Independent with basic ADLs;Independent with household mobility without device;Independent with community mobility without device    Vocation  Other (comment)   disability   Vocation Requirements  Practiced as an [employer], walking outside, word puzzles, read      ADL   Eating/Feeding  Minimal assistance    Grooming  Minimal assistance   cannot manage hair care   Upper Body Bathing  Minimal assistance    Lower Body Bathing  Modified independent    Upper Body Dressing  Moderate assistance    Lower Body Dressing  Minimal assistance    Toilet Transfer  Modified independent    Toileting - Clothing Manipulation  Modified independent    [place]  Tub/Shower Furniture conservator/restorer without back;Grab bars    ADL comments  Daughter wants information to know when she can reduce level of assistance safely      IADL   Prior Level of Function Shopping  Independent    Shopping  Completely unable to shop    Prior Level of Function Light Housekeeping  Independent    Light Housekeeping  Does not participate in any housekeeping tasks    Prior Level of Function Meal Prep  Independent    Meal Prep  Needs to have meals prepared and served    Prior Level of Function Sales executive  Relies on family or friends for transportation    Prior Level of Function Medication Managment  Independent    Medication Management  --   daughter assists   Prior Level of Function Financial Management  Independent    Financial Management  Dependent      Written Expression   Dominant Hand  Right    Handwriting  Increased time      Vision - History   Baseline Vision  Wears glasses  only for reading   Working with opthalomologist to manage retinal degeneration   Visual History  Cataracts    Patient Visual Report  Nausea/blurring vision with head movement    Additional Comments  has been working with opthalmology x past 3 months for injections - helping with acuity      Vision Assessment   Eye Alignment  Impaired (comment)    Vision Assessment  Vision impaired  _ to be further tested in functional context    Ocular Range of Motion  Impaired to be futher tested in functional context    Alignment/Gaze Preference  Within Defined Limits    Saccades  Additional eye shifts occurred during testing   saccadic dysmetria horizontal   Acuity  Impaired - to be further tested in functional context   20/70 per Neuro note 05/31/18   Comment  mild ataxic eye movements      Activity Tolerance   Activity Tolerance  --   endurance limits participation     Cognition   Overall Cognitive Status  Impaired/Different from baseline    Area of Impairment  Memory;Attention    Current Attention Level  Selective    Memory  Decreased short-term memory    Cognition Comments  Patient able to self report history, looks to daughter to generate questions.  Reports struggling with concentration and memory at times      Posture/Postural Control   Posture/Postural Control  Postural limitations      Sensation   Light Touch  Impaired by gross assessment   decreased sensation proximally- humerus, trunk   Stereognosis  Appears Intact    Proprioception  Appears Intact      Coordination   Gross Motor Movements are Fluid and Coordinated  No    Fine Motor Movements are Fluid and Coordinated  No    Coordination and Movement Description  Ataxia    Finger Nose Finger Test  dysmetria right UE    Right 9 Hole Peg Test  1.29.27    Left 9 Hole Peg Test  32.13      Perception   Perception  Within Functional Limits      Praxis   Praxis  Intact      Tone   Assessment Location  Right Upper Extremity  ROM / Strength   AROM / PROM / Strength  AROM      AROM   Overall AROM   Deficits    AROM Assessment Site  Shoulder;Elbow;Forearm    Right/Left Shoulder  Right    Right Shoulder Flexion  100 Degrees    Right/Left Elbow  Right    Right Elbow Extension  --   lacks ~10degrees of extension   Right/Left Forearm  Right    Right Forearm Supination  --   Lacks ~ 15* supination     Hand Function   Right Hand Gross Grasp  Impaired    Right Hand Grip (lbs)  25    Right Hand Lateral Pinch  6 lbs    Left Hand Gross Grasp  Functional    Left Hand Grip (lbs)  55    Left Hand Lateral Pinch  11 lbs      RUE Tone   RUE Tone  Moderate;Hypertonic      RUE Tone   Hypertonic Details  Elbow flex, forearm pronation                      OT Education - 06/06/18 1055    Education Details  reviewed eval findings, potential goals, and OT plan of care    Person(s) Educated  Patient;Child(ren)    Methods  Explanation    Comprehension  Verbalized understanding       OT Short Term Goals - 06/06/18 1109      OT SHORT TERM GOAL #1   Title  Patient will complete HEP designed to improve AROM right shoulder and elbow with min assist due 07/06/18    Time  4    Period  Weeks    Status  New    Target Date  07/06/18      OT SHORT TERM GOAL #2   Title  Patient will demonstrate safe transfer with supervision into/out of bathroom without reliance on unstable objects for support (e.g. towel bar, door)    Time  4    Period  Weeks    Status  New      OT SHORT TERM GOAL #3   Title  Patient will don a front opening shirt or jacket with no more than set up assistance while standing (excludes buttons, zippers)    Time  4    Period  Weeks    Status  New      OT SHORT TERM GOAL #4   Title  Patient will shower with modified independence    Time  4    Period  Weeks    Status  New      OT SHORT TERM GOAL #5   Title  Patient will complete HEP designed to improve right hand strength and  coordination    Time  4    Period  Weeks    Status  New        OT Long Term Goals - 06/06/18 1120      OT LONG TERM GOAL #1   Title  Patient will complete an updated HEP to help improve right UE range of motion and strength due 08/05/18    Time  8    Period  Weeks    Status  New    Target Date  08/05/18      OT LONG TERM GOAL #2   Title  Patient will prepare a simple meal with min assist - using ambulation as primary mode  of mobility    Time  8    Period  Weeks    Status  New      OT LONG TERM GOAL #3   Title  Patient will shop at grocery store with family member and close supervision - outing will last 20 minutes or greater.      Time  8    Period  Weeks    Status  New      OT LONG TERM GOAL #4   Title  Patient will complete simple homemaking tasks- washing dishes, making bed, dusting with close supervision    Time  8    Period  Weeks    Status  New      OT LONG TERM GOAL #5   Title  Patient will demonstrate at least 40 lb right grip strength to aide with functional grip, opening bottles, etc.      Time  8    Period  Weeks    Status  New      Long Term Additional Goals   Additional Long Term Goals  Yes      OT LONG TERM GOAL #6   Title  Patient will complete 9 hole peg test in 60 sec or less with right UE    Time  8    Period  Weeks    Status  New      OT LONG TERM GOAL #7   Title  Patient will demonstrate adequate range of motion and proximal strength in right UE to reach while standing to obtain 2 lb object from chest high shelf.      Time  8    Period  Weeks    Status  New            Plan - 06/06/18 1057    Clinical Impression Statement  Patient presents to OT evaluation today with her daughter.  Patient able to self report medical history.  Patient referred to OP OT to address deficits from most recent cerebellar stroke from Feb 2019.  Patient with past medical history significant for diagnosis of MS in her teenage years, multiple strokes, HTN, DM.    Patient relocated to [facility] form [place] in 2019 after recent stroke as she needed more assistance with ADL's and mobility.  Patient demonstrates right spastic hemiparesis, ataxic movement, decreased visual acuity, decreased balance, decreased activity tolerance/endurance, decreased gross and fine motor coordination, and mild cognitive deficits.  Patient will benefit from skilled OT services to decrease level of assistance with ADL, and help her return to IADL activities which she enjoys.       Occupational Profile and client history currently impacting functional performance  Mother, Nurse- on disability, church member    Occupational performance deficits (Please refer to evaluation for details):  ADL's;IADL's;Leisure;Work    Rehab Potential  Good    OT Frequency  2x / week    OT Duration  8 weeks    OT Treatment/Interventions  Self-care/ADL training;Electrical Stimulation;Therapeutic exercise;Visual/perceptual remediation/compensation;Aquatic Therapy;Moist Heat;Neuromuscular education;Splinting;Patient/family education;Fluidtherapy;Energy conservation;Therapist, nutritional;Therapeutic activities;Balance training;Cryotherapy;Ultrasound;DME and/or AE instruction;Manual Therapy;Cognitive remediation/compensation    Plan  Review OT goals, further assess vision - acuity, visual motor, visual vestibular, start HEP - supine shoulder, elbow, forearm.      Clinical Decision Making  Several treatment options, min-mod task modification necessary    Consulted and Agree with Plan of Care  Patient;Family member/caregiver    Family Member Consulted  Vella Redhead- daughter       Patient will  benefit from skilled therapeutic intervention in order to improve the following deficits and impairments:  Decreased cognition, Impaired flexibility, Impaired vision/preception, Decreased coordination, Decreased mobility, Impaired sensation, Improper body mechanics, Decreased activity tolerance, Decreased endurance,  Decreased range of motion, Decreased strength, Impaired tone, Decreased balance, Decreased knowledge of precautions, Decreased safety awareness, Impaired perceived functional ability, Impaired UE functional use, Decreased knowledge of use of DME  Visit Diagnosis: Spastic hemiplegia of right dominant side as late effect of cerebral infarction [facility]) - Plan: Ot plan of care cert/re-cert  Other lack of coordination - Plan: Ot plan of care cert/re-cert  Unsteadiness on feet - Plan: Ot plan of care cert/re-cert  Stiffness of right shoulder, not elsewhere classified - Plan: Ot plan of care cert/re-cert  Stiffness of right elbow, not elsewhere classified - Plan: Ot plan of care cert/re-cert  Muscle weakness (generalized) - Plan: Ot plan of care cert/re-cert  Visuospatial deficit - Plan: Ot plan of care cert/re-cert  Cognitive social or emotional deficit following cerebral infarction - Plan: Ot plan of care cert/re-cert  Other disturbances of skin sensation - Plan: Ot plan of care cert/re-cert    Problem List There are no active problems to display for this patient.   Mariah Milling, OTR/L 06/06/2018, 11:35 AM  [facility] [address] [place], [place], [address] Phone: [phone]   Fax:  [phone]  Name: Litzy Dicker MRN: [id number] Date of Birth: 1965/07/22

## 2018-06-07 ENCOUNTER — Ambulatory Visit: Payer: Medicare Other | Admitting: Occupational Therapy

## 2018-06-12 ENCOUNTER — Ambulatory Visit: Payer: Medicare Other | Admitting: Physical Therapy

## 2018-06-12 ENCOUNTER — Telehealth: Payer: Self-pay

## 2018-06-12 ENCOUNTER — Encounter: Payer: Self-pay | Admitting: Physical Therapy

## 2018-06-12 DIAGNOSIS — R2681 Unsteadiness on feet: Secondary | ICD-10-CM

## 2018-06-12 DIAGNOSIS — I69351 Hemiplegia and hemiparesis following cerebral infarction affecting right dominant side: Secondary | ICD-10-CM | POA: Diagnosis not present

## 2018-06-12 DIAGNOSIS — R262 Difficulty in walking, not elsewhere classified: Secondary | ICD-10-CM

## 2018-06-12 DIAGNOSIS — R29818 Other symptoms and signs involving the nervous system: Secondary | ICD-10-CM

## 2018-06-12 DIAGNOSIS — R42 Dizziness and giddiness: Secondary | ICD-10-CM

## 2018-06-12 DIAGNOSIS — R2689 Other abnormalities of gait and mobility: Secondary | ICD-10-CM

## 2018-06-12 DIAGNOSIS — M6281 Muscle weakness (generalized): Secondary | ICD-10-CM

## 2018-06-12 NOTE — Therapy (Signed)
[facility] [address] [place], [place], [address] Phone: [phone]   Fax:  [phone]  Physical Therapy Treatment and 10th Visit Progress Note  Patient Details  Name: Hannah [name] MRN: [id number] Date of Birth: 09-May-1966 Referring Provider (PT): Harlene Ramus, MD   Encounter Date: 06/12/2018  PT End of Session - 06/12/18 1242    Visit Number  9   10th visit note performed on visit #9; write again on visit 19   Number of Visits  25    Date for PT Re-Evaluation  08/11/18    Authorization Type  [employer] Medicare - 10th visit PN (write on visit #19)    PT Start Time  1145    PT Stop Time  1230    PT Time Calculation (min)  45 min    Activity Tolerance  Patient tolerated treatment well    Behavior During Therapy  [place] for tasks assessed/performed       Past Medical History:  Diagnosis Date  . Cerebral infarction ([place]) 07/12/2017   2009 first one, has had a total of 4 stokes  . Diabetes type 2, controlled ([place])   . Hemiparesis affecting right side as late effect of cerebrovascular accident (CVA) ([place])   . Hyperlipidemia   . Hypertension   . Multiple sclerosis ([place]) 2009  . Stroke [facility])     Past Surgical History:  Procedure Laterality Date  . APPENDECTOMY    . CYSTOSTOMY W/ BLADDER BIOPSY    . OVARIAN CYST SURGERY    . stent placed in heart      There were no vitals filed for this visit.  Subjective Assessment - 06/12/18 1154    Subjective  Pt feeling better, has recovered from multiple illnesses.  Has MRI on Saturday to check status of MS.     Patient is accompained by:  Family member    Pertinent History  4th stroke, MS, diabetic    Limitations  Walking;House hold activities;Lifting;Standing;Writing;Reading    Patient Stated Goals  balance, walking better, get stronger, stairs    Currently in Pain?  No/denies         [facility] PT Assessment - 06/12/18 1157      Assessment   Medical Diagnosis  R  spastic Hemiparesis    Referring Provider (PT)  Harlene Ramus, MD    Onset Date/Surgical Date  07/13/17    Hand Dominance  Right    Prior Therapy  after prior strokes, home health PT       Precautions   Precautions  Fall    Precaution Comments  ataxic gait      [place] residence    Living Arrangements  Children    Available Help at Discharge  Family    Type of [place] to enter    Entrance Stairs-Number of Steps  [place]  One level    [place] - 2 wheels;Shower seat;Toilet riser      Prior Function   Level of Independence  Independent with basic ADLs;Independent with household mobility without device;Independent with community mobility without device      Observation/Other Assessments   Focus on Therapeutic Outcomes (FOTO)   Not assessed      Ambulation/Gait   Ramp  4: Min assist    Ramp Details (indicate cue type and reason)  with rollator, leaning forwards to ascend, shorter step length and foot clearance when descending    Curb  3: Mod assist    Curb Details (indicate cue type and reason)  with rollator, verbal cues for safety and sequencing      Standardized Balance Assessment   Standardized Balance Assessment  Berg Balance Test;Five Times Sit to Stand;10 meter walk test    Five times sit to stand comments   36.4 seconds without UE but therapist min A to keep COG moving anteriorly    10 Meter Walk  18.66 seconds with rollator or 1.75 ft/sec       Berg Balance Test   Sit to Stand  Able to stand  independently using hands    Standing Unsupported  Able to stand 2 minutes with supervision    Sitting with Back Unsupported but Feet Supported on Floor or Stool  Able to sit safely and securely 2 minutes    Stand to Sit  Sits safely with minimal use of hands    Transfers  Able to transfer safely, definite need of hands    Standing Unsupported with Eyes  Closed  Able to stand 10 seconds with supervision    Standing Ubsupported with Feet Together  Needs help to attain position but able to stand for 30 seconds with feet together    From Standing, Reach Forward with Outstretched Arm  Can reach forward >5 cm safely (2")    From Standing Position, Pick up Object from Floor  Unable to pick up shoe, but reaches 2-5 cm (1-2") from shoe and balances independently    From Standing Position, Turn to Look Behind Over each Shoulder  Looks behind one side only/other side shows less weight shift    Turn 360 Degrees  Needs assistance while turning    Standing Unsupported, Alternately Place Feet on Step/Stool  Needs assistance to keep from falling or unable to try    Standing Unsupported, One Foot in Front  Needs help to step but can hold 15 seconds    Standing on One Leg  Tries to lift leg/unable to hold 3 seconds but remains standing independently    Total Score  30    Berg comment:  30/56                           PT Education - 06/12/18 1242    Education Details  results of reassessment, areas to continue to address, addition of more visits    Person(s) Educated  Patient;Child(ren)    Methods  Explanation    Comprehension  Verbalized understanding       PT Short Term Goals - 06/12/18 1251      PT SHORT TERM GOAL #1   Title  Pt will perform HEP with supervision    Time  4    Period  Weeks    Status  Revised    Target Date  07/12/18      PT SHORT TERM GOAL #2   Title  Pt will improve LE strength as indicated by five time sit to stance to </= 40 seconds without UE support or assistance from therapist    Baseline  36 with min A from therapist to shift weight forwards    Time  4    Period  Weeks    Status  Revised    Target Date  07/12/18      PT SHORT TERM GOAL #3  Title  Pt will improve BERG balance to >/= 35/56    Baseline  declined to 30/56    Time  4    Period  Weeks    Status  Revised    Target Date  07/12/18       PT SHORT TERM GOAL #4   Title  Pt will improve gait velocity with rollator to >/= 1.8 ft/sec    Baseline  declined to 1.75 ft/sec with rollator    Time  4    Period  Weeks    Status  Revised    Target Date  07/12/18      PT SHORT TERM GOAL #5   Title  Pt to participate in full vestibular assessment    Time  4    Period  Weeks    Status  New    Target Date  07/12/18        PT Long Term Goals - 06/12/18 1247      PT LONG TERM GOAL #1   Title  Pt and daughter will demonstrate independence with HEP     Time  8    Period  Weeks    Status  On-going    Target Date  08/11/18      PT LONG TERM GOAL #2   Title  Pt will perform community ambulation x 500 feet with rollator while negotiating a curb and ramp to improve safety and endurance in the community with supervision    Time  8    Period  Weeks    Status  On-going    Target Date  08/11/18      PT LONG TERM GOAL #3   Title  Pt will negotiate safely 10 stairs forwards to ascend, backwards to descend with L rail and supervision    Baseline  Min A for 8 stairs with L rail    Time  8    Period  Weeks    Status  On-going    Target Date  08/11/18      PT LONG TERM GOAL #4   Title  Pt will improve five time sit to stand to </= 35 seconds without use of UE with supervision (no assistance from therapist)    Baseline  36 seconds with min A from therapist to weight shift forwards    Time  8    Period  Weeks    Status  Revised    Target Date  08/11/18      PT LONG TERM GOAL #5   Title  Pt will improve BERG to >/= 39/56    Time  8    Period  Weeks    Status  Revised    Target Date  08/11/18      Additional Long Term Goals   Additional Long Term Goals  Yes      PT LONG TERM GOAL #6   Title  Pt will improve gait velocity with rollator and appropriate AFO to >/= 2.2 ft/sec    Time  8    Period  Weeks    Status  Revised    Target Date  08/11/18      PT LONG TERM GOAL #7   Title  vestibular goal as indicated             Plan - 06/12/18 1256    Clinical Impression Statement  This progress note covers PT treatment sessions between dates of 03/22/2018 through 06/12/2018.  Pt returns to therapy after  prolonged time of illness and multiple weeks of missed treatment sessions.  Performed re-assessment of overall function, falls risk and areas of impairment.  Pt experienced a slight decline in LE strength, balance and safety with gait with rollator.  No LTG met, all LTG ongoing or revised.  Pt continues to be at increased risk for falls.  Pt will benefit from continued skilled PT services to address these impairments to maximize functional mobility independence and decrease falls risk.  Pt and daughter in agreement.      Rehab Potential  Good    Clinical Impairments Affecting Rehab Potential  four strokes in 10 years with significant change in status with recent stroke, PMH of MS, diabetes    PT Frequency  2x / week    PT Duration  8 weeks    PT Treatment/Interventions  ADLs/Self Care Home Management;DME Instruction;Gait training;Stair training;Functional mobility training;Therapeutic activities;Therapeutic exercise;Balance training;Neuromuscular re-education;Patient/family education;Orthotic Fit/Training;Manual techniques;Passive range of motion;Energy conservation;Visual/perceptual remediation/compensation;Canalith Repostioning;Electrical Stimulation;Vestibular    PT Next Visit Plan  assess vestibular.  Revise HEP.  Sit > stand with more forward weight shift. continued to gait train with quad cane focusing on control of forward momentum; Balance with turns without UE support - PWR exercises for rotation!!    PT Home Exercise Plan  [id number]     Consulted and Agree with Plan of Care  Patient;Family member/caregiver    Family Member Consulted  daughter       Patient will benefit from skilled therapeutic intervention in order to improve the following deficits and impairments:  Abnormal gait, Decreased  coordination, Decreased range of motion, Difficulty walking, Impaired tone, Decreased endurance, Decreased safety awareness, Increased muscle spasms, Impaired UE functional use, Decreased activity tolerance, Impaired vision/preception, Decreased balance, Decreased knowledge of use of DME, Hypomobility, Improper body mechanics, Decreased cognition, Decreased mobility, Decreased strength, Dizziness  Visit Diagnosis: Hemiplegia and hemiparesis following cerebral infarction affecting right dominant side (HCC)  Difficulty in walking, not elsewhere classified  Other abnormalities of gait and mobility  Muscle weakness (generalized)  Unsteadiness on feet  Dizziness and giddiness  Other symptoms and signs involving the nervous system     Problem List There are no active problems to display for this patient.   Rico Junker, PT, DPT 06/12/18    1:07 PM    [facility] [address] [place], [place], [address] Phone: [phone]   Fax:  [phone]  Name: Hannah [name] MRN: [id number] Date of Birth: 10/18/1965

## 2018-06-12 NOTE — Telephone Encounter (Signed)
IF patient or daughter calls back transfer them to [name] in medical records thanks. Pt needs to sign release form for [facility], [facility], and Hannah [name].   Left vm for [name] pts daughter that release form for medical records needs to be sign for [name], and [name].

## 2018-06-14 ENCOUNTER — Telehealth: Payer: Self-pay | Admitting: *Deleted

## 2018-06-14 ENCOUNTER — Ambulatory Visit: Payer: Medicare Other | Admitting: Occupational Therapy

## 2018-06-14 ENCOUNTER — Encounter: Payer: Self-pay | Admitting: Physical Therapy

## 2018-06-14 ENCOUNTER — Ambulatory Visit: Payer: Medicare Other | Admitting: Physical Therapy

## 2018-06-14 DIAGNOSIS — R2681 Unsteadiness on feet: Secondary | ICD-10-CM

## 2018-06-14 DIAGNOSIS — M6281 Muscle weakness (generalized): Secondary | ICD-10-CM

## 2018-06-14 DIAGNOSIS — R278 Other lack of coordination: Secondary | ICD-10-CM

## 2018-06-14 DIAGNOSIS — I69351 Hemiplegia and hemiparesis following cerebral infarction affecting right dominant side: Secondary | ICD-10-CM | POA: Diagnosis not present

## 2018-06-14 DIAGNOSIS — R262 Difficulty in walking, not elsewhere classified: Secondary | ICD-10-CM

## 2018-06-14 DIAGNOSIS — R2689 Other abnormalities of gait and mobility: Secondary | ICD-10-CM

## 2018-06-14 DIAGNOSIS — R42 Dizziness and giddiness: Secondary | ICD-10-CM

## 2018-06-14 NOTE — Patient Instructions (Signed)
Laying on your back with knees bent, rock your knees side to side 10x  Laying on your back hold and empty shoe box between your hands, perform chest press by starting with elbows bent, stretch arms up to ceiling then back to chest, 10x  With elbows straight hold a shoebox between your hands, raise arms overhead then back to your chest 10 x

## 2018-06-14 NOTE — Patient Instructions (Signed)
Access Code: [email or web address]  URL: [email or web address]  Date: 06/14/2018  Prepared by: Willow Ora   Exercises  Supine Bridge - 10 reps - 1 sets - 1x daily - 5x weekly  Seated Hamstring Stretch with Chair - 3 reps - 1 sets - 30 hold - 1x daily - 5x weekly  Sit to Stand with Armchair - 10 reps - 1 sets - 1x daily - 5x weekly  Side Stepping with Counter Support - 3 reps - 1 sets - 1x daily - 5x weekly  Standing Single Leg Stance with Counter Support - 3 reps - 1 sets - 20 sec hold - 1x daily - 5x weekly  Standing Tandem Balance with Counter Support - 2-3 reps - 1 sets - 15 sec hold - 1x daily - 5x weekly

## 2018-06-14 NOTE — Telephone Encounter (Signed)
Medical records from [facility] in [place] given to Dr. Leonie Man for review.

## 2018-06-14 NOTE — Telephone Encounter (Signed)
R/c medical records from [facility] notes on [place] desk.

## 2018-06-15 NOTE — Therapy (Signed)
[name] [address] [place], [place], [address] Phone: [phone]   Fax:  [phone]  Physical Therapy Treatment  Patient Details  Name: Carlie [name] MRN: [id number] Date of Birth: [birth year] Referring Provider (PT): Harlene Ramus, MD   Encounter Date: 06/14/2018  PT End of Session - 06/14/18 1447    Visit Number  10   10th visit note performed on visit #9; write again on visit 19   Number of Visits  25    Date for PT Re-Evaluation  08/11/18    Authorization Type  [employer] Medicare - 10th visit PN (write on visit #19)    PT Start Time  1446    PT Stop Time  1530    PT Time Calculation (min)  44 min    Activity Tolerance  Patient tolerated treatment well    Behavior During Therapy  [facility] for tasks assessed/performed       Past Medical History:  Diagnosis Date  . Cerebral infarction ([place]) 07/12/2017   2009 first one, has had a total of 4 stokes  . Diabetes type 2, controlled ([place])   . Hemiparesis affecting right side as late effect of cerebrovascular accident (CVA) ([place])   . Hyperlipidemia   . Hypertension   . Multiple sclerosis ([place]) 2009  . Stroke [facility])     Past Surgical History:  Procedure Laterality Date  . APPENDECTOMY    . CYSTOSTOMY W/ BLADDER BIOPSY    . OVARIAN CYST SURGERY    . stent placed in heart      There were no vitals filed for this visit.  Subjective Assessment - 06/14/18 1447    Subjective  No new complaitns. No falls or pain to report.     Patient is accompained by:  Family member    Pertinent History  4th stroke, MS, diabetic    Limitations  Walking;House hold activities;Lifting;Standing;Writing;Reading    Patient Stated Goals  balance, walking better, get stronger, stairs    Currently in Pain?  No/denies      treatment: issued the following to pt's HEP today. Min guard assist with balance ex's and cues on correct ex form/technique. Attempted fwd/bwd marching and fwd tandem at  counter with up to mod assist needed, therefore did not send these home with HEP.   Access Code: [email or web address]  URL: [email or web address]  Date: 06/14/2018  Prepared by: Willow Ora   Exercises  Supine Bridge - 10 reps - 1 sets - 1x daily - 5x weekly  Seated Hamstring Stretch with Chair - 3 reps - 1 sets - 30 hold - 1x daily - 5x weekly  Sit to Stand with Armchair - 10 reps - 1 sets - 1x daily - 5x weekly  Side Stepping with Counter Support - 3 reps - 1 sets - 1x daily - 5x weekly  Standing Single Leg Stance with Counter Support - 3 reps - 1 sets - 20 sec hold - 1x daily - 5x weekly  Standing Tandem Balance with Counter Support - 2-3 reps - 1 sets - 15 sec hold - 1x daily - 5x weekly     Removed from HEP the following: long arc quads, standing march in place, rhomberg to tandem stand with support and hip extension, hip abdcutions due to most now easy for pt or addressed with other ex's.          PT Education - 06/14/18 1630    Education Details  revised HEP    Person(s) Educated  Patient    Methods  Explanation;Demonstration;Verbal cues;Handout    Comprehension  Verbalized understanding;Returned demonstration;Verbal cues required;Need further instruction       PT Short Term Goals - 06/12/18 1251      PT SHORT TERM GOAL #1   Title  Pt will perform HEP with supervision    Time  4    Period  Weeks    Status  Revised    Target Date  07/12/18      PT SHORT TERM GOAL #2   Title  Pt will improve LE strength as indicated by five time sit to stance to </= 40 seconds without UE support or assistance from therapist    Baseline  36 with min A from therapist to shift weight forwards    Time  4    Period  Weeks    Status  Revised    Target Date  07/12/18      PT SHORT TERM GOAL #3   Title  Pt will improve BERG balance to >/= 35/56    Baseline  declined to 30/56    Time  4    Period  Weeks    Status  Revised    Target Date  07/12/18      PT SHORT TERM GOAL #4    Title  Pt will improve gait velocity with rollator to >/= 1.8 ft/sec    Baseline  declined to 1.75 ft/sec with rollator    Time  4    Period  Weeks    Status  Revised    Target Date  07/12/18      PT SHORT TERM GOAL #5   Title  Pt to participate in full vestibular assessment    Time  4    Period  Weeks    Status  New    Target Date  07/12/18        PT Long Term Goals - 06/12/18 1247      PT LONG TERM GOAL #1   Title  Pt and daughter will demonstrate independence with HEP     Time  8    Period  Weeks    Status  On-going    Target Date  08/11/18      PT LONG TERM GOAL #2   Title  Pt will perform community ambulation x 500 feet with rollator while negotiating a curb and ramp to improve safety and endurance in the community with supervision    Time  8    Period  Weeks    Status  On-going    Target Date  08/11/18      PT LONG TERM GOAL #3   Title  Pt will negotiate safely 10 stairs forwards to ascend, backwards to descend with L rail and supervision    Baseline  Min A for 8 stairs with L rail    Time  8    Period  Weeks    Status  On-going    Target Date  08/11/18      PT LONG TERM GOAL #4   Title  Pt will improve five time sit to stand to </= 35 seconds without use of UE with supervision (no assistance from therapist)    Baseline  36 seconds with min A from therapist to weight shift forwards    Time  8    Period  Weeks    Status  Revised    Target Date  08/11/18      PT LONG TERM  GOAL #5   Title  Pt will improve BERG to >/= 39/56    Time  8    Period  Weeks    Status  Revised    Target Date  08/11/18      Additional Long Term Goals   Additional Long Term Goals  Yes      PT LONG TERM GOAL #6   Title  Pt will improve gait velocity with rollator and appropriate AFO to >/= 2.2 ft/sec    Time  8    Period  Weeks    Status  Revised    Target Date  08/11/18      PT LONG TERM GOAL #7   Title  vestibular goal as indicated            Plan - 06/14/18 1448     Clinical Impression Statement  Today's skilled session focused on condensing and revising pt's HEP to address strengthening and balance. No issues reported with ex's performed in session today. The pt is progressing toward goals and shoud benefit from continued PT to progress toward unmet goals.     Rehab Potential  Good    Clinical Impairments Affecting Rehab Potential  four strokes in 10 years with significant change in status with recent stroke, PMH of MS, diabetes    PT Frequency  2x / week    PT Duration  8 weeks    PT Treatment/Interventions  ADLs/Self Care Home Management;DME Instruction;Gait training;Stair training;Functional mobility training;Therapeutic activities;Therapeutic exercise;Balance training;Neuromuscular re-education;Patient/family education;Orthotic Fit/Training;Manual techniques;Passive range of motion;Energy conservation;Visual/perceptual remediation/compensation;Canalith Repostioning;Electrical Stimulation;Vestibular    PT Next Visit Plan  assess vestibular.  Sit > stand with more forward weight shift. continued to gait train with quad cane focusing on control of forward momentum; Balance with turns without UE support - PWR exercises for rotation!!    PT Home Exercise Plan  [email or web address]     Consulted and Agree with Plan of Care  Patient;Family member/caregiver    Family Member Consulted  daughter       Patient will benefit from skilled therapeutic intervention in order to improve the following deficits and impairments:  Abnormal gait, Decreased coordination, Decreased range of motion, Difficulty walking, Impaired tone, Decreased endurance, Decreased safety awareness, Increased muscle spasms, Impaired UE functional use, Decreased activity tolerance, Impaired vision/preception, Decreased balance, Decreased knowledge of use of DME, Hypomobility, Improper body mechanics, Decreased cognition, Decreased mobility, Decreased strength, Dizziness  Visit Diagnosis: Hemiplegia and  hemiparesis following cerebral infarction affecting right dominant side (HCC)  Difficulty in walking, not elsewhere classified  Other abnormalities of gait and mobility  Muscle weakness (generalized)  Unsteadiness on feet  Dizziness and giddiness     Problem List There are no active problems to display for this patient.   Willow Ora, PTA, [facility] [address] [place], [place] [address] [phone] 06/15/18, 3:59 PM   Name: Nalina Yeatman MRN: [id number] Date of Birth: May 02, 1966

## 2018-06-15 NOTE — Therapy (Signed)
[facility] [address] [place], [place], [address] Phone: [phone]   Fax:  [phone]  Occupational Therapy Treatment  Patient Details  Name: Hannah [name] MRN: [id number] Date of Birth: March 20, 1966 Referring Provider (OT): Lennette Bihari Via   Encounter Date: 06/14/2018  OT End of Session - 06/15/18 1300    Visit Number  2    Number of Visits  17    Authorization Type  UHC Medicare    Authorization Time Period  PN every 10th visit    Authorization - Visit Number  2    Authorization - Number of Visits  10    OT Start Time  7124    OT Stop Time  1445    OT Time Calculation (min)  40 min    Activity Tolerance  Patient tolerated treatment well    Behavior During Therapy  [facility] for tasks assessed/performed       Past Medical History:  Diagnosis Date  . Cerebral infarction ([place]) 07/12/2017   2009 first one, has had a total of 4 stokes  . Diabetes type 2, controlled ([place])   . Hemiparesis affecting right side as late effect of cerebrovascular accident (CVA) ([place])   . Hyperlipidemia   . Hypertension   . Multiple sclerosis ([place]) 2009  . Stroke [facility])     Past Surgical History:  Procedure Laterality Date  . APPENDECTOMY    . CYSTOSTOMY W/ BLADDER BIOPSY    . OVARIAN CYST SURGERY    . stent placed in heart      There were no vitals filed for this visit.  Subjective Assessment - 06/15/18 1304    Pertinent History  Prior strokes with Right hemiparesis, cerebellar strokes Feb 2019, MS    Currently in Pain?  No/denies                Treatment: supine, gentle joint mobs to right shoulder followed by AA/ROM unilateral shoulder flexion, rocking knees side to side for spasticity  management then bilateral shoulder flexion, chest press and diagonals, min-mod v.c Edge of mat reaching for floor and then weightbearing through RUE with body on arm movements, min-mod facilitation.           OT Education -  06/15/18 1304    Education Details  inital HEP, see pt instructions    Person(s) Educated  Patient    Methods  Explanation;Verbal cues;Demonstration;Handout    Comprehension  Verbalized understanding;Returned demonstration;Verbal cues required       OT Short Term Goals - 06/06/18 1109      OT SHORT TERM GOAL #1   Title  Patient will complete HEP designed to improve AROM right shoulder and elbow with min assist due 07/06/18    Time  4    Period  Weeks    Status  New    Target Date  07/06/18      OT SHORT TERM GOAL #2   Title  Patient will demonstrate safe transfer with supervision into/out of bathroom without reliance on unstable objects for support (e.g. towel bar, door)    Time  4    Period  Weeks    Status  New      OT SHORT TERM GOAL #3   Title  Patient will don a front opening shirt or jacket with no more than set up assistance while standing (excludes buttons, zippers)    Time  4    Period  Weeks    Status  New  OT SHORT TERM GOAL #4   Title  Patient will shower with modified independence    Time  4    Period  Weeks    Status  New      OT SHORT TERM GOAL #5   Title  Patient will complete HEP designed to improve right hand strength and coordination    Time  4    Period  Weeks    Status  New        OT Long Term Goals - 06/06/18 1120      OT LONG TERM GOAL #1   Title  Patient will complete an updated HEP to help improve right UE range of motion and strength due 08/05/18    Time  8    Period  Weeks    Status  New    Target Date  08/05/18      OT LONG TERM GOAL #2   Title  Patient will prepare a simple meal with min assist - using ambulation as primary mode of mobility    Time  8    Period  Weeks    Status  New      OT LONG TERM GOAL #3   Title  Patient will shop at grocery store with family member and close supervision - outing will last 20 minutes or greater.      Time  8    Period  Weeks    Status  New      OT LONG TERM GOAL #4   Title  Patient  will complete simple homemaking tasks- washing dishes, making bed, dusting with close supervision    Time  8    Period  Weeks    Status  New      OT LONG TERM GOAL #5   Title  Patient will demonstrate at least 40 lb right grip strength to aide with functional grip, opening bottles, etc.      Time  8    Period  Weeks    Status  New      Long Term Additional Goals   Additional Long Term Goals  Yes      OT LONG TERM GOAL #6   Title  Patient will complete 9 hole peg test in 60 sec or less with right UE    Time  8    Period  Weeks    Status  New      OT LONG TERM GOAL #7   Title  Patient will demonstrate adequate range of motion and proximal strength in right UE to reach while standing to obtain 2 lb object from chest high shelf.      Time  8    Period  Weeks    Status  New            Plan - 06/15/18 1302    Clinical Impression Statement  Pt is progressing towards goals. She demonstrates understanding of inital HEP.    Occupational Profile and client history currently impacting functional performance  Mother, Nurse- on disability, church member    Occupational performance deficits (Please refer to evaluation for details):  ADL's;IADL's;Leisure;Work    Rehab Potential  Good    OT Frequency  2x / week    OT Duration  8 weeks    OT Treatment/Interventions  Self-care/ADL training;Electrical Stimulation;Therapeutic exercise;Visual/perceptual remediation/compensation;Aquatic Therapy;Moist Heat;Neuromuscular education;Splinting;Patient/family education;Fluidtherapy;Energy conservation;Therapist, nutritional;Therapeutic activities;Balance training;Cryotherapy;Ultrasound;DME and/or AE instruction;Manual Therapy;Cognitive remediation/compensation    Plan  check on HEP.Review OT goals, further  assess vision - acuity, visual motor, visual vestibular,     Consulted and Agree with Plan of Care  Patient       Patient will benefit from skilled therapeutic intervention in order to  improve the following deficits and impairments:  Decreased cognition, Impaired flexibility, Impaired vision/preception, Decreased coordination, Decreased mobility, Impaired sensation, Improper body mechanics, Decreased activity tolerance, Decreased endurance, Decreased range of motion, Decreased strength, Impaired tone, Decreased balance, Decreased knowledge of precautions, Decreased safety awareness, Impaired perceived functional ability, Impaired UE functional use, Decreased knowledge of use of DME  Visit Diagnosis: Hemiplegia and hemiparesis following cerebral infarction affecting right dominant side (HCC)  Muscle weakness (generalized)  Other lack of coordination    Problem List There are no active problems to display for this patient.   Tremond Shimabukuro 06/15/2018, 1:05 PM Theone Murdoch, OTR/L [phone] Phone: [phone] 1:07 PM 06/15/18 [facility] [address] [place], [place], [address] Phone: [phone]   Fax:  [phone]  Name: Aileene Lanum MRN: [id number] Date of Birth: Jan 31, 1966

## 2018-06-16 ENCOUNTER — Ambulatory Visit: Payer: Medicare Other | Admitting: Occupational Therapy

## 2018-06-16 DIAGNOSIS — I69351 Hemiplegia and hemiparesis following cerebral infarction affecting right dominant side: Secondary | ICD-10-CM

## 2018-06-16 DIAGNOSIS — R278 Other lack of coordination: Secondary | ICD-10-CM

## 2018-06-16 DIAGNOSIS — M6281 Muscle weakness (generalized): Secondary | ICD-10-CM

## 2018-06-16 DIAGNOSIS — R2689 Other abnormalities of gait and mobility: Secondary | ICD-10-CM

## 2018-06-16 NOTE — Therapy (Signed)
[facility] [address] [place], [place], [address] Phone: [phone]   Fax:  [phone]  Occupational Therapy Treatment  Patient Details  Name: Hannah [name] MRN: [id number] Date of Birth: 11-05-1965 Referring Provider (OT): Lennette Bihari Via   Encounter Date: 06/16/2018  OT End of Session - 06/16/18 1622    Visit Number  3    Number of Visits  17    Authorization Type  UHC Medicare    Authorization Time Period  PN every 10th visit    Authorization - Visit Number  3    Authorization - Number of Visits  10    OT Start Time  4854    OT Stop Time  1445    OT Time Calculation (min)  40 min    Activity Tolerance  Patient tolerated treatment well    Behavior During Therapy  [facility] for tasks assessed/performed       Past Medical History:  Diagnosis Date  . Cerebral infarction ([place]) 07/12/2017   2009 first one, has had a total of 4 stokes  . Diabetes type 2, controlled ([place])   . Hemiparesis affecting right side as late effect of cerebrovascular accident (CVA) ([place])   . Hyperlipidemia   . Hypertension   . Multiple sclerosis ([place]) 2009  . Stroke [facility])     Past Surgical History:  Procedure Laterality Date  . APPENDECTOMY    . CYSTOSTOMY W/ BLADDER BIOPSY    . OVARIAN CYST SURGERY    . stent placed in heart      There were no vitals filed for this visit.  Subjective Assessment - 06/16/18 1621    Subjective   Pt denies pain    Pertinent History  Prior strokes with Right hemiparesis, cerebellar strokes Feb 2019, MS    Currently in Pain?  No/denies               Treatment: supine, gentle joint mobs to right shoulder followed by AA/ROM unilateral shoulder flexion, rocking knees side to side for spasticity  management then bilateral shoulder flexion with medium ball, chest press , min v.c Standing facing mat rolling ball forwards and backwards, then cat/ cow modified standing with hands on mat min facilitation  then seated  weightbearing through RUE elbow and hand  with body on arm movements, min-mod facilitation. Standing to perform open chain reaching mid range to place graded clothespins and large pegs min v.c to avoid compensation.               OT Short Term Goals - 06/06/18 1109      OT SHORT TERM GOAL #1   Title  Patient will complete HEP designed to improve AROM right shoulder and elbow with min assist due 07/06/18    Time  4    Period  Weeks    Status  New    Target Date  07/06/18      OT SHORT TERM GOAL #2   Title  Patient will demonstrate safe transfer with supervision into/out of bathroom without reliance on unstable objects for support (e.g. towel bar, door)    Time  4    Period  Weeks    Status  New      OT SHORT TERM GOAL #3   Title  Patient will don a front opening shirt or jacket with no more than set up assistance while standing (excludes buttons, zippers)    Time  4    Period  Weeks  Status  New      OT SHORT TERM GOAL #4   Title  Patient will shower with modified independence    Time  4    Period  Weeks    Status  New      OT SHORT TERM GOAL #5   Title  Patient will complete HEP designed to improve right hand strength and coordination    Time  4    Period  Weeks    Status  New        OT Long Term Goals - 06/06/18 1120      OT LONG TERM GOAL #1   Title  Patient will complete an updated HEP to help improve right UE range of motion and strength due 08/05/18    Time  8    Period  Weeks    Status  New    Target Date  08/05/18      OT LONG TERM GOAL #2   Title  Patient will prepare a simple meal with min assist - using ambulation as primary mode of mobility    Time  8    Period  Weeks    Status  New      OT LONG TERM GOAL #3   Title  Patient will shop at grocery store with family member and close supervision - outing will last 20 minutes or greater.      Time  8    Period  Weeks    Status  New      OT LONG TERM GOAL #4   Title  Patient  will complete simple homemaking tasks- washing dishes, making bed, dusting with close supervision    Time  8    Period  Weeks    Status  New      OT LONG TERM GOAL #5   Title  Patient will demonstrate at least 40 lb right grip strength to aide with functional grip, opening bottles, etc.      Time  8    Period  Weeks    Status  New      Long Term Additional Goals   Additional Long Term Goals  Yes      OT LONG TERM GOAL #6   Title  Patient will complete 9 hole peg test in 60 sec or less with right UE    Time  8    Period  Weeks    Status  New      OT LONG TERM GOAL #7   Title  Patient will demonstrate adequate range of motion and proximal strength in right UE to reach while standing to obtain 2 lb object from chest high shelf.      Time  8    Period  Weeks    Status  New            Plan - 06/16/18 1622    Clinical Impression Statement  Pt is progressing towards goals. She demonstrates improving RUE functional use.    Occupational Profile and client history currently impacting functional performance  Mother, Nurse- on disability, church member    Occupational performance deficits (Please refer to evaluation for details):  ADL's;IADL's;Leisure;Work    Rehab Potential  Good    OT Frequency  2x / week    OT Duration  8 weeks    OT Treatment/Interventions  Self-care/ADL training;Electrical Stimulation;Therapeutic exercise;Visual/perceptual remediation/compensation;Aquatic Therapy;Moist Heat;Neuromuscular education;Splinting;Patient/family education;Fluidtherapy;Energy conservation;Therapist, nutritional;Therapeutic activities;Balance training;Cryotherapy;Ultrasound;DME and/or AE instruction;Manual Therapy;Cognitive remediation/compensation  Plan   further assess vision - acuity, visual motor, visual vestibular,     Consulted and Agree with Plan of Care  Patient    Family Member Consulted  Vella Redhead- daughter       Patient will benefit from skilled therapeutic intervention  in order to improve the following deficits and impairments:  Decreased cognition, Impaired flexibility, Impaired vision/preception, Decreased coordination, Decreased mobility, Impaired sensation, Improper body mechanics, Decreased activity tolerance, Decreased endurance, Decreased range of motion, Decreased strength, Impaired tone, Decreased balance, Decreased knowledge of precautions, Decreased safety awareness, Impaired perceived functional ability, Impaired UE functional use, Decreased knowledge of use of DME  Visit Diagnosis: Hemiplegia and hemiparesis following cerebral infarction affecting right dominant side (HCC)  Other abnormalities of gait and mobility  Muscle weakness (generalized)  Other lack of coordination    Problem List There are no active problems to display for this patient.   Darrion Wyszynski 06/16/2018, 4:23 PM  [facility] [address] [place], [place], [address] Phone: [phone]   Fax:  [phone]  Name: Doyce Stonehouse MRN: [id number] Date of Birth: Sep 15, 1965

## 2018-06-17 ENCOUNTER — Ambulatory Visit
Admission: RE | Admit: 2018-06-17 | Discharge: 2018-06-17 | Disposition: A | Discharge status: Home / Self Care | Payer: Medicare Other | Source: Ambulatory Visit | Attending: Neurology | Admitting: Neurology

## 2018-06-17 DIAGNOSIS — G35 Multiple sclerosis: Secondary | ICD-10-CM

## 2018-06-17 MED ORDER — GADOBENATE DIMEGLUMINE 529 MG/ML IV SOLN
7.0000 mL | Freq: Once | INTRAVENOUS | Status: AC | PRN
  Administered 2018-06-17: 7 mL via INTRAVENOUS

## 2018-06-18 ENCOUNTER — Other Ambulatory Visit: Payer: Self-pay

## 2018-06-18 ENCOUNTER — Encounter (HOSPITAL_COMMUNITY): Payer: Self-pay | Admitting: Emergency Medicine

## 2018-06-18 ENCOUNTER — Emergency Department (HOSPITAL_COMMUNITY)
Admission: EM | Admit: 2018-06-18 | Discharge: 2018-06-18 | Disposition: A | Discharge status: Home / Self Care | Payer: Medicare Other | Attending: Emergency Medicine | Admitting: Emergency Medicine

## 2018-06-18 ENCOUNTER — Emergency Department (HOSPITAL_COMMUNITY): Payer: Medicare Other

## 2018-06-18 DIAGNOSIS — Z7982 Long term (current) use of aspirin: Secondary | ICD-10-CM | POA: Insufficient documentation

## 2018-06-18 DIAGNOSIS — E119 Type 2 diabetes mellitus without complications: Secondary | ICD-10-CM | POA: Insufficient documentation

## 2018-06-18 DIAGNOSIS — Z79899 Other long term (current) drug therapy: Secondary | ICD-10-CM | POA: Insufficient documentation

## 2018-06-18 DIAGNOSIS — Z7984 Long term (current) use of oral hypoglycemic drugs: Secondary | ICD-10-CM | POA: Insufficient documentation

## 2018-06-18 DIAGNOSIS — I10 Essential (primary) hypertension: Secondary | ICD-10-CM | POA: Diagnosis not present

## 2018-06-18 DIAGNOSIS — M79671 Pain in right foot: Secondary | ICD-10-CM

## 2018-06-18 DIAGNOSIS — I69951 Hemiplegia and hemiparesis following unspecified cerebrovascular disease affecting right dominant side: Secondary | ICD-10-CM | POA: Insufficient documentation

## 2018-06-18 MED ORDER — HYDROCODONE-ACETAMINOPHEN 5-325 MG PO TABS
1.0000 | ORAL_TABLET | Freq: Once | ORAL | Status: AC
  Administered 2018-06-18: 1 via ORAL
  Filled 2018-06-18: qty 1

## 2018-06-18 MED ORDER — HYDROCODONE-ACETAMINOPHEN 5-325 MG PO TABS: 1.0000 | ORAL_TABLET | ORAL | 0 refills | Status: DC | PRN

## 2018-06-18 MED ORDER — BLOOD PRESSURE MONITOR DEVI: 1.0000 [IU] | Freq: Two times a day (BID) | 0 refills | Status: DC

## 2018-06-18 NOTE — Discharge Instructions (Signed)
Monitor your blood pressure. Call your doctor if your blood pressure remains elevated. Return to ER for new or worsening symptoms.  Strict salt restricted diet due to elevated blood pressure today.

## 2018-06-18 NOTE — ED Notes (Signed)
Pt verbalized understanding of discharge paperwork, prescriptions and follow-up care 

## 2018-06-18 NOTE — ED Triage Notes (Signed)
Pt reports R foot pain that started after having MRI yesterday with contrast.  Also reports pain to R side of neck x 2-3 days.

## 2018-06-18 NOTE — ED Provider Notes (Signed)
[facility] EMERGENCY DEPARTMENT Provider Note   CSN: [id number] Arrival date & time: 06/18/18  1506     History   Chief Complaint No chief complaint on file.   HPI Hannah [name] is a 53 y.o. female.  53 year old female with history of CVA with right-sided weakness presents with complaint of pain in her right foot.  Patient had an MRI with contrast of her head and C-spine yesterday and was then taken to her car.  Patient states about an hour after MRI she noticed pain in her right foot.  Pain at rest is described as a throbbing or dull ache.  Pain is worse with bearing weight on her right foot.  Patient is ambulatory with a walker.  Patient denies any injuries to her foot, swelling, redness.  Pain does not radiate up into her leg.  No other complaints or concerns.     Past Medical History:  Diagnosis Date  . Cerebral infarction ([place]) 07/12/2017   2009 first one, has had a total of 4 stokes  . Diabetes type 2, controlled ([place])   . Hemiparesis affecting right side as late effect of cerebrovascular accident (CVA) ([place])   . Hyperlipidemia   . Hypertension   . Multiple sclerosis ([place]) 2009  . Stroke [facility])     There are no active problems to display for this patient.   Past Surgical History:  Procedure Laterality Date  . APPENDECTOMY    . CYSTOSTOMY W/ BLADDER BIOPSY    . OVARIAN CYST SURGERY    . stent placed in heart       OB History   No obstetric history on file.      Home Medications    Prior to Admission medications   Medication Sig Start Date End Date Taking? Authorizing Provider  amLODipine (NORVASC) 10 MG tablet Take 10 mg by mouth daily.    [provider]  aspirin 81 MG chewable tablet Chew by mouth.    [provider]  Blood Pressure Monitor DEVI 1 Units by Does not apply route 2 (two) times daily. 06/18/18   Tacy Learn, PA-C  clopidogrel (PLAVIX) 75 MG tablet  04/18/18   [provider]  famotidine (PEPCID)  40 MG tablet  05/25/18   [provider]  FLUoxetine (PROZAC) 20 MG capsule  02/27/18   [provider]  glipiZIDE (GLUCOTROL) 10 MG tablet TAKE 1 TABLET BY MOUTH TWICE DAILY BEFORE  MEALS. 08/26/17   [provider]  hydrochlorothiazide (HYDRODIURIL) 25 MG tablet  04/18/18   [provider]  HYDROcodone-acetaminophen (NORCO/VICODIN) 5-325 MG tablet Take 1 tablet by mouth every 4 (four) hours as needed. 06/18/18   Tacy Learn, PA-C  Insulin Degludec (TRESIBA ) Inject 35-40 Units into the skin at bedtime.    [provider]  insulin lispro (HUMALOG KWIKPEN) 100 UNIT/ML KwikPen Inject into the skin. 09/28/17 09/28/18  [provider]  lisinopril (PRINIVIL,ZESTRIL) 40 MG tablet Take by mouth. 01/18/18   [provider]  metoprolol tartrate (LOPRESSOR) 100 MG tablet Take 100 mg by mouth 2 (two) times daily.    [provider]  nitroGLYCERIN (NITROSTAT) 0.4 MG SL tablet DISSOLVE ONE TABLET UNDER THE TONGUE EVERY 5 MINUTES AS NEEDED FOR CHEST PAIN.  DO NOT EXCEED A TOTAL OF 3 DOSES IN 15 MINUTES 04/05/16   [provider]  rosuvastatin (CRESTOR) 20 MG tablet Take by mouth. 09/02/17   [provider]    Family History No family  history on file.  Social History Social History   Tobacco Use  . Smoking status: Never Smoker  . Smokeless tobacco: Never Used  Substance Use Topics  . Alcohol use: Not Currently  . Drug use: Not Currently     Allergies   Ibuprofen; Influenza vaccines; Pneumococcal vaccine; Promethazine; Amlodipine; Cyclobenzaprine; and Oxycodone   Review of Systems Review of Systems  Constitutional: Negative for fever.  Musculoskeletal: Positive for arthralgias and gait problem.  Skin: Negative for color change, rash and wound.  Allergic/Immunologic: Positive for immunocompromised state.  Neurological: Negative for numbness.  Hematological: Negative for adenopathy. Does not bruise/bleed  easily.  All other systems reviewed and are negative.    Physical Exam Updated Vital Signs BP (!) 198/89   Pulse 79   Temp 98.5 F (36.9 C) (Oral)   Resp 18   Ht 5\' 2"  (1.575 m)   Wt 66.7 kg   SpO2 98%   BMI 26.89 kg/m   Physical Exam Vitals signs and nursing note reviewed.  Constitutional:      General: She is not in acute distress.    Appearance: She is well-developed. She is not diaphoretic.  HENT:     Head: Normocephalic and atraumatic.  Cardiovascular:     Pulses: Normal pulses.  Pulmonary:     Effort: Pulmonary effort is normal.  Musculoskeletal:        General: Tenderness present. No swelling or deformity.     Right foot: Normal capillary refill. Tenderness present. No swelling, crepitus, deformity or laceration.       Feet:  Skin:    General: Skin is warm and dry.     Findings: No erythema or rash.  Neurological:     Mental Status: She is alert and oriented to person, place, and time.  Psychiatric:        Behavior: Behavior normal.      ED Treatments / Results  Labs (all labs ordered are listed, but only abnormal results are displayed) Labs Reviewed - No data to display  EKG None  Radiology Dg Foot Complete Right  Result Date: 06/18/2018 CLINICAL DATA:  Pt c/o pain to the top of her right foot since having an MRI with contrast yesterday. Pt denies any injury to right foot. EXAM: RIGHT FOOT COMPLETE - 3+ VIEW COMPARISON:  None. FINDINGS: No fracture or bone lesion. The joints are normally spaced and aligned. No significant arthropathic change. Soft tissues are unremarkable. IMPRESSION: Negative. Electronically Signed   By: Lajean Manes M.D.   On: 06/18/2018 16:40    Procedures Procedures (including critical care time)  Medications Ordered in ED Medications  HYDROcodone-acetaminophen (NORCO/VICODIN) 5-325 MG per tablet 1 tablet (1 tablet Oral Given 06/18/18 1753)     Initial Impression / Assessment and Plan / ED Course  I have reviewed the  triage vital signs and the nursing notes.  Pertinent labs & imaging results that were available during my care of the patient were reviewed by me and considered in my medical decision making (see chart for details).  Clinical Course as of Jun 18 2030  Nancy Fetter Jun 18, 3069  731 53 year old female presents with complaint of pain in her right foot without injury.  On exam foot is normal-appearing, no ecchymosis or swelling, no deformity.  Strong dorsalis pedis pulse present, neurovascular intact.  Patient has tenderness to the dorsum and plantar aspects of her right foot.  X-ray is unremarkable for bony abnormality.  Patient was placed in a stiff supportive shoe advised  to use her walker given Norco for pain and recommend follow-up with PCP.  Patient's blood pressure noted to be elevated, she is noted complaints of chest pain, visual disturbance or headache.  Patient should monitor her blood pressure, given prescription for her to do this at home and follow-up with her PCP.   [LM]    Clinical Course User Index [LM] Tacy Learn, PA-C   Final Clinical Impressions(s) / ED Diagnoses   Final diagnoses:  Right foot pain    ED Discharge Orders         Ordered    Blood Pressure Monitor DEVI  2 times daily     06/18/18 1715    HYDROcodone-acetaminophen (NORCO/VICODIN) 5-325 MG tablet  Every 4 hours PRN     06/18/18 1752           Roque Lias 06/18/18 2032    Little, Wenda Overland, MD 06/18/18 2352

## 2018-06-20 ENCOUNTER — Ambulatory Visit: Payer: Medicare Other | Admitting: Rehabilitation

## 2018-06-20 ENCOUNTER — Ambulatory Visit: Payer: Medicare Other | Admitting: Occupational Therapy

## 2018-06-22 ENCOUNTER — Telehealth: Payer: Self-pay

## 2018-06-22 NOTE — Telephone Encounter (Signed)
Notes recorded by Marval Regal, RN on 06/22/2018 at 9:24 AM EST I receive call from [facility] that MRI shows several old inactive multiple sclerosis lesions on both sides at several locations in the brain but they are all inactive. No new or worrisome findings. Pt daughter verbalized understanding. ------  Notes recorded by Marval Regal, RN on 06/22/2018 at 9:22 AM EST I receive called from patients daughter Hannah [name] MR brain shows several old inactive multiple sclerosis lesion in upper cervical spine cord and mild wear and tear changes of arthritis in lower neck but no major compression.The daughter verbalized understanding. ------

## 2018-06-22 NOTE — Telephone Encounter (Signed)
-----   Message from Garvin Fila, MD sent at 06/21/2018  8:51 PM EST ----- Kindly inform the patient that brain MRI shows several old inactive multiple sclerosis lesions on both sides at several locations in the brain but they are all inactive. No new or worrisome findings.

## 2018-06-23 ENCOUNTER — Ambulatory Visit: Payer: Medicare Other | Admitting: Physical Therapy

## 2018-06-23 ENCOUNTER — Ambulatory Visit: Payer: Medicare Other | Attending: Family Medicine | Admitting: Occupational Therapy

## 2018-06-23 VITALS — BP 205/98

## 2018-06-23 DIAGNOSIS — R262 Difficulty in walking, not elsewhere classified: Secondary | ICD-10-CM | POA: Insufficient documentation

## 2018-06-23 DIAGNOSIS — R29818 Other symptoms and signs involving the nervous system: Secondary | ICD-10-CM | POA: Diagnosis present

## 2018-06-23 DIAGNOSIS — I69351 Hemiplegia and hemiparesis following cerebral infarction affecting right dominant side: Secondary | ICD-10-CM | POA: Diagnosis present

## 2018-06-23 DIAGNOSIS — R2689 Other abnormalities of gait and mobility: Secondary | ICD-10-CM | POA: Insufficient documentation

## 2018-06-23 DIAGNOSIS — R2681 Unsteadiness on feet: Secondary | ICD-10-CM | POA: Insufficient documentation

## 2018-06-23 DIAGNOSIS — R278 Other lack of coordination: Secondary | ICD-10-CM | POA: Insufficient documentation

## 2018-06-23 DIAGNOSIS — R42 Dizziness and giddiness: Secondary | ICD-10-CM | POA: Insufficient documentation

## 2018-06-23 DIAGNOSIS — M6281 Muscle weakness (generalized): Secondary | ICD-10-CM | POA: Diagnosis present

## 2018-06-23 NOTE — Therapy (Signed)
[facility] [address] [place], [place], [address] Phone: [phone]   Fax:  [phone]  Occupational Therapy Treatment  Patient Details  Name: Hannah [name] MRN: [id number] Date of Birth: 1965-11-11 Referring Provider (OT): Lennette Bihari Via   Encounter Date: 06/23/2018  OT End of Session - 06/23/18 0811    Visit Number  4    Number of Visits  17    Authorization Type  UHC Medicare    Authorization Time Period  PN every 10th visit    Authorization - Visit Number  4    Authorization - Number of Visits  10    OT Start Time  [phone]    OT Stop Time  0845    OT Time Calculation (min)  39 min    Activity Tolerance  Patient tolerated treatment well    Behavior During Therapy  [facility] for tasks assessed/performed       Past Medical History:  Diagnosis Date  . Cerebral infarction ([place]) 07/12/2017   2009 first one, has had a total of 4 stokes  . Diabetes type 2, controlled ([place])   . Hemiparesis affecting right side as late effect of cerebrovascular accident (CVA) ([place])   . Hyperlipidemia   . Hypertension   . Multiple sclerosis ([place]) 2009  . Stroke [facility])     Past Surgical History:  Procedure Laterality Date  . APPENDECTOMY    . CYSTOSTOMY W/ BLADDER BIOPSY    . OVARIAN CYST SURGERY    . stent placed in heart      Vitals:   06/23/18 0810  BP: (!) 205/98    Subjective Assessment - 06/23/18 0810    Subjective   Pt reports pain in foot    Pertinent History  Prior strokes with Right hemiparesis, cerebellar strokes Feb 2019, MS    Currently in Pain?  Yes    Pain Score  8     Pain Location  Foot    Pain Orientation  Right    Pain Descriptors / Indicators  Aching    Pain Onset  In the past 7 days    Pain Frequency  Intermittent    Aggravating Factors   walking on it    Pain Relieving Factors  rest             Treatment: Pt reprots falling in the bathroom since last visit and she bumped her side.  Pt practiced  donning / doffing a sweater as her dtr has been helping at home, pt performed modified independently. Pt was encouraged to perfor at home as her dtr is helping a lot. Pt reports BP has been elevated, therapist checked BP it was 205/98. Pt states she did not take her medicine this morning. Pt had meds with her and she took them. Therapist called pt's PCP office as pt reports she would like to be seen for BP and for pain in her side s/p fall. Pt is scheduled to be seen at 1:15 today by the PA who works for her PCP.  Pt was transported to her dtr's car via private car. Pt was instructed to take it easy until her MD appt. And to call 911 if she has stroke symptoms.                  OT Short Term Goals - 06/23/18 6754      OT SHORT TERM GOAL #1   Title  Patient will complete HEP designed to improve AROM right  shoulder and elbow with min assist due 07/06/18    Time  4    Period  Weeks    Status  New      OT SHORT TERM GOAL #2   Title  Patient will demonstrate safe transfer with supervision into/out of bathroom without reliance on unstable objects for support (e.g. towel bar, door)    Time  4    Period  Weeks    Status  On-going      OT SHORT TERM GOAL #3   Title  Patient will don a front opening shirt or jacket with no more than set up assistance while standing (excludes buttons, zippers)    Time  4    Period  Weeks    Status  Achieved      OT SHORT TERM GOAL #4   Title  Patient will shower with modified independence    Time  4    Period  Weeks    Status  New      OT SHORT TERM GOAL #5   Title  Patient will complete HEP designed to improve right hand strength and coordination    Time  4    Period  Weeks    Status  On-going        OT Long Term Goals - 06/06/18 1120      OT LONG TERM GOAL #1   Title  Patient will complete an updated HEP to help improve right UE range of motion and strength due 08/05/18    Time  8    Period  Weeks    Status  New    Target Date   08/05/18      OT LONG TERM GOAL #2   Title  Patient will prepare a simple meal with min assist - using ambulation as primary mode of mobility    Time  8    Period  Weeks    Status  New      OT LONG TERM GOAL #3   Title  Patient will shop at grocery store with family member and close supervision - outing will last 20 minutes or greater.      Time  8    Period  Weeks    Status  New      OT LONG TERM GOAL #4   Title  Patient will complete simple homemaking tasks- washing dishes, making bed, dusting with close supervision    Time  8    Period  Weeks    Status  New      OT LONG TERM GOAL #5   Title  Patient will demonstrate at least 40 lb right grip strength to aide with functional grip, opening bottles, etc.      Time  8    Period  Weeks    Status  New      Long Term Additional Goals   Additional Long Term Goals  Yes      OT LONG TERM GOAL #6   Title  Patient will complete 9 hole peg test in 60 sec or less with right UE    Time  8    Period  Weeks    Status  New      OT LONG TERM GOAL #7   Title  Patient will demonstrate adequate range of motion and proximal strength in right UE to reach while standing to obtain 2 lb object from chest high shelf.      Time  8  Period  Weeks    Status  New            Plan - 06/23/18 4643    Clinical Impression Statement  Pt reports falling in the bathroom since last visit. Pt's BP was elevated today so treatment was limited.    Occupational Profile and client history currently impacting functional performance  Mother, Nurse- on disability, church member    Occupational performance deficits (Please refer to evaluation for details):  ADL's;IADL's;Leisure;Work    Rehab Potential  Good    OT Frequency  2x / week    OT Duration  8 weeks    OT Treatment/Interventions  Self-care/ADL training;Electrical Stimulation;Therapeutic exercise;Visual/perceptual remediation/compensation;Aquatic Therapy;Moist Heat;Neuromuscular  education;Splinting;Patient/family education;Fluidtherapy;Energy conservation;Therapist, nutritional;Therapeutic activities;Balance training;Cryotherapy;Ultrasound;DME and/or AE instruction;Manual Therapy;Cognitive remediation/compensation    Plan  coordination HEP, further assess vision    Consulted and Agree with Plan of Care  Patient    Family Member Consulted  Vella Redhead- daughter       Patient will benefit from skilled therapeutic intervention in order to improve the following deficits and impairments:  Decreased cognition, Impaired flexibility, Impaired vision/preception, Decreased coordination, Decreased mobility, Impaired sensation, Improper body mechanics, Decreased activity tolerance, Decreased endurance, Decreased range of motion, Decreased strength, Impaired tone, Decreased balance, Decreased knowledge of precautions, Decreased safety awareness, Impaired perceived functional ability, Impaired UE functional use, Decreased knowledge of use of DME  Visit Diagnosis: Hemiplegia and hemiparesis following cerebral infarction affecting right dominant side (HCC)  Other abnormalities of gait and mobility    Problem List There are no active problems to display for this patient.   Romain Erion 06/23/2018, 8:43 AM  [facility] [address] [place], [place], [address] Phone: [phone]   Fax:  [phone]  Name: Hannah [name] MRN: [id number] Date of Birth: [birth year]

## 2018-06-28 ENCOUNTER — Ambulatory Visit: Payer: Medicare Other | Admitting: Physical Therapy

## 2018-06-28 ENCOUNTER — Ambulatory Visit: Payer: Medicare Other | Admitting: Occupational Therapy

## 2018-06-30 ENCOUNTER — Ambulatory Visit: Payer: Medicare Other | Admitting: Occupational Therapy

## 2018-06-30 ENCOUNTER — Ambulatory Visit: Payer: Medicare Other | Admitting: Physical Therapy

## 2018-07-03 ENCOUNTER — Ambulatory Visit: Payer: Medicare Other | Admitting: Occupational Therapy

## 2018-07-03 ENCOUNTER — Ambulatory Visit: Payer: Medicare Other | Admitting: Physical Therapy

## 2018-07-05 ENCOUNTER — Ambulatory Visit: Payer: Medicare Other | Admitting: Occupational Therapy

## 2018-07-10 ENCOUNTER — Encounter: Payer: Self-pay | Admitting: Occupational Therapy

## 2018-07-10 ENCOUNTER — Ambulatory Visit: Payer: Medicare Other | Admitting: Physical Therapy

## 2018-07-10 ENCOUNTER — Encounter: Payer: Self-pay | Admitting: Physical Therapy

## 2018-07-10 ENCOUNTER — Ambulatory Visit: Payer: Medicare Other | Admitting: Occupational Therapy

## 2018-07-10 VITALS — BP 160/86 | HR 77

## 2018-07-10 DIAGNOSIS — R29818 Other symptoms and signs involving the nervous system: Secondary | ICD-10-CM

## 2018-07-10 DIAGNOSIS — R278 Other lack of coordination: Secondary | ICD-10-CM

## 2018-07-10 DIAGNOSIS — I69351 Hemiplegia and hemiparesis following cerebral infarction affecting right dominant side: Secondary | ICD-10-CM

## 2018-07-10 DIAGNOSIS — R262 Difficulty in walking, not elsewhere classified: Secondary | ICD-10-CM

## 2018-07-10 DIAGNOSIS — R2689 Other abnormalities of gait and mobility: Secondary | ICD-10-CM

## 2018-07-10 DIAGNOSIS — R2681 Unsteadiness on feet: Secondary | ICD-10-CM

## 2018-07-10 DIAGNOSIS — M6281 Muscle weakness (generalized): Secondary | ICD-10-CM

## 2018-07-10 DIAGNOSIS — R42 Dizziness and giddiness: Secondary | ICD-10-CM

## 2018-07-10 NOTE — Therapy (Signed)
[facility] [address] [place], [place], [address] Phone: [phone]   Fax:  [phone]  Physical Therapy Treatment  Patient Details  Name: Hannah [name] MRN: [id number] Date of Birth: 25-Jul-1965 Referring Provider (PT): Harlene Ramus, MD   Encounter Date: 07/10/2018  PT End of Session - 07/10/18 0859    Visit Number  11   10th visit note performed on visit #9; write again on visit 19   Number of Visits  25    Date for PT Re-Evaluation  08/11/18    Authorization Type  [employer] Medicare - 10th visit PN (write on visit #19)    PT Start Time  0805    PT Stop Time  0845    PT Time Calculation (min)  40 min    Activity Tolerance  Patient tolerated treatment well    Behavior During Therapy  [facility] for tasks assessed/performed       Past Medical History:  Diagnosis Date  . Cerebral infarction ([place]) 07/12/2017   2009 first one, has had a total of 4 stokes  . Diabetes type 2, controlled ([place])   . Hemiparesis affecting right side as late effect of cerebrovascular accident (CVA) ([place])   . Hyperlipidemia   . Hypertension   . Multiple sclerosis ([place]) 2009  . Stroke [facility])     Past Surgical History:  Procedure Laterality Date  . APPENDECTOMY    . CYSTOSTOMY W/ BLADDER BIOPSY    . OVARIAN CYST SURGERY    . stent placed in heart      Vitals:   07/10/18 0811  BP: (!) 160/86  Pulse: 77    Subjective Assessment - 07/10/18 0809    Subjective  Still having some R foot pain but it is improving with steroids.  Physician feels her foot pain is plantar fasciitis.  Had a fall and injured her ribs causing increased pain; pt had to miss therapy due to these reasons.  Rib pain is "aggravating".     Patient is accompained by:  Family member    Pertinent History  4th stroke, MS, diabetic    Limitations  Walking;House hold activities;Lifting;Standing;Writing;Reading    Diagnostic tests  Recent MRI showed old MS lesions on both side  of brain but no new lesions    Patient Stated Goals  balance, walking better, get stronger, stairs    Currently in Pain?  Yes         [facility] PT Assessment - 07/10/18 0825      Palpation   Palpation comment  Assessed R foot through palpation and PROM.  Pt reports pain on top of foot and tenderness to palpation on dorsal portion of foot at 3, 4, 5th metatarsal phalangeal joints.  No pain with passive DF, inversion or eversion.  No pain along plantar aspect of foot or at heel.  Symptoms not consistent with plantar fasciitis, appear more consistent with inflammation at the joint due to change in foot mechanics.                      Vestibular Treatment/Exercise - 07/10/18 0834      Vestibular Treatment/Exercise   Vestibular Treatment Provided  Gaze    Gaze Exercises  X1 Viewing Horizontal;X1 Viewing Vertical      X1 Viewing Horizontal   Foot Position  seated with feet supported, no back support    Reps  2    Comments  15 seconds      X1  Viewing Vertical   Foot Position  seated with feet supported, no back support    Reps  2            PT Education - 07/10/18 0859    Education Details  added VOR x 1 viewing in sitting to HEP    Person(s) Educated  Patient    Methods  Explanation;Demonstration;Handout    Comprehension  Verbalized understanding;Returned demonstration       PT Short Term Goals - 06/12/18 1251      PT SHORT TERM GOAL #1   Title  Pt will perform HEP with supervision    Time  4    Period  Weeks    Status  Revised    Target Date  07/12/18      PT SHORT TERM GOAL #2   Title  Pt will improve LE strength as indicated by five time sit to stance to </= 40 seconds without UE support or assistance from therapist    Baseline  36 with min A from therapist to shift weight forwards    Time  4    Period  Weeks    Status  Revised    Target Date  07/12/18      PT SHORT TERM GOAL #3   Title  Pt will improve BERG balance to >/= 35/56    Baseline   declined to 30/56    Time  4    Period  Weeks    Status  Revised    Target Date  07/12/18      PT SHORT TERM GOAL #4   Title  Pt will improve gait velocity with rollator to >/= 1.8 ft/sec    Baseline  declined to 1.75 ft/sec with rollator    Time  4    Period  Weeks    Status  Revised    Target Date  07/12/18      PT SHORT TERM GOAL #5   Title  Pt to participate in full vestibular assessment    Time  4    Period  Weeks    Status  New    Target Date  07/12/18        PT Long Term Goals - 06/12/18 1247      PT LONG TERM GOAL #1   Title  Pt and daughter will demonstrate independence with HEP     Time  8    Period  Weeks    Status  On-going    Target Date  08/11/18      PT LONG TERM GOAL #2   Title  Pt will perform community ambulation x 500 feet with rollator while negotiating a curb and ramp to improve safety and endurance in the community with supervision    Time  8    Period  Weeks    Status  On-going    Target Date  08/11/18      PT LONG TERM GOAL #3   Title  Pt will negotiate safely 10 stairs forwards to ascend, backwards to descend with L rail and supervision    Baseline  Min A for 8 stairs with L rail    Time  8    Period  Weeks    Status  On-going    Target Date  08/11/18      PT LONG TERM GOAL #4   Title  Pt will improve five time sit to stand to </= 35 seconds without use of UE with supervision (no  assistance from therapist)    Baseline  36 seconds with min A from therapist to weight shift forwards    Time  8    Period  Weeks    Status  Revised    Target Date  08/11/18      PT LONG TERM GOAL #5   Title  Pt will improve BERG to >/= 39/56    Time  8    Period  Weeks    Status  Revised    Target Date  08/11/18      Additional Long Term Goals   Additional Long Term Goals  Yes      PT LONG TERM GOAL #6   Title  Pt will improve gait velocity with rollator and appropriate AFO to >/= 2.2 ft/sec    Time  8    Period  Weeks    Status  Revised     Target Date  08/11/18      PT LONG TERM GOAL #7   Title  vestibular goal as indicated            Plan - 07/10/18 0900    Clinical Impression Statement  Treatment session focused on assessment of foot pain now that pt is tolerating increased WB; does not appear to be consistent with typical pain pattern for plantar fasciitis but appears to be more consistent with inflammatory condition of 3rd-5th joints.  No exercises given at this time to address but will continue to monitor.  Continued to address vestibular impairments by adding VOR x 1 viewing in sitting for gaze stability training and habituation.  Pt tolerated with mild symptoms.  Will continue to address and will reassess pt functional level at next visit.    Rehab Potential  Good    Clinical Impairments Affecting Rehab Potential  four strokes in 10 years with significant change in status with recent stroke, PMH of MS, diabetes    PT Frequency  2x / week    PT Duration  8 weeks    PT Treatment/Interventions  ADLs/Self Care Home Management;DME Instruction;Gait training;Stair training;Functional mobility training;Therapeutic activities;Therapeutic exercise;Balance training;Neuromuscular re-education;Patient/family education;Orthotic Fit/Training;Manual techniques;Passive range of motion;Energy conservation;Visual/perceptual remediation/compensation;Canalith Repostioning;Electrical Stimulation;Vestibular    PT Next Visit Plan  CHECK STG and re-assess function.   Sit > stand with more forward weight shift. continued to gait train with quad cane focusing on control of forward momentum; Balance with turns without UE support - PWR exercises for rotation!!    PT Home Exercise Plan  [id number]     Consulted and Agree with Plan of Care  Patient;Family member/caregiver    Family Member Consulted  daughter       Patient will benefit from skilled therapeutic intervention in order to improve the following deficits and impairments:  Abnormal gait,  Decreased coordination, Decreased range of motion, Difficulty walking, Impaired tone, Decreased endurance, Decreased safety awareness, Increased muscle spasms, Impaired UE functional use, Decreased activity tolerance, Impaired vision/preception, Decreased balance, Decreased knowledge of use of DME, Hypomobility, Improper body mechanics, Decreased cognition, Decreased mobility, Decreased strength, Dizziness  Visit Diagnosis: Hemiplegia and hemiparesis following cerebral infarction affecting right dominant side (HCC)  Difficulty in walking, not elsewhere classified  Other abnormalities of gait and mobility  Muscle weakness (generalized)  Unsteadiness on feet  Dizziness and giddiness     Problem List There are no active problems to display for this patient.   Rico Junker, PT, DPT 07/10/18    9:03 AM    [facility] [address] [place], [place], [address] Phone: [phone]   Fax:  [phone]  Name: Hannah [name] MRN: [id number] Date of Birth: 12-30-1965

## 2018-07-10 NOTE — Patient Instructions (Signed)
Home exercise program for your arms: Keep your ball or shoe box next to your bed. Do 10 in the morning before you get up and 10 at night after you go to bed.  1. Laying on your back hold and empty shoe box or beach ball between your hands, perform chest press by starting with elbows bent, stretch arms up to ceiling then back to chest, 10x  2. +With elbows straight hold a shoebox or beach ball between your hands, raise arms overhead then back to your chest 10 x  1. Grip Strengthening (Resistive Putty)  Red putty.  Make sure you make it into a fat hot dog in between each squeeze.    Squeeze putty using thumb and all fingers. Repeat _5___ times. Rest then do 5 more.  Do __2__ sessions per day.   2. Roll putty into a snake on the table and pinch along the snake.  If it feels too hard to pinch make the snake a little longer.  Make sure you fold it over and roll it out between each one.  Do 5.  Do 2 sessions per day.      Copyright  VHI. All rights reserved.

## 2018-07-10 NOTE — Therapy (Signed)
[facility] [address] [place], [place], [address] Phone: [phone]   Fax:  [phone]  Occupational Therapy Treatment  Patient Details  Name: Hannah [name] MRN: [id number] Date of Birth: [birth year] Referring Provider (OT): Lennette Bihari Via   Encounter Date: 07/10/2018  OT End of Session - 07/10/18 1205    Visit Number  5    Number of Visits  17    Authorization Type  [employer] Medicare    Authorization Time Period  PN every 10th visit    Authorization - Visit Number  5    Authorization - Number of Visits  10    OT Start Time  0846    OT Stop Time  0929    OT Time Calculation (min)  43 min    Activity Tolerance  Patient tolerated treatment well       Past Medical History:  Diagnosis Date  . Cerebral infarction ([place]) 07/12/2017   2009 first one, has had a total of 4 stokes  . Diabetes type 2, controlled ([place])   . Hemiparesis affecting right side as late effect of cerebrovascular accident (CVA) ([place])   . Hyperlipidemia   . Hypertension   . Multiple sclerosis ([place]) 2009  . Stroke [facility])     Past Surgical History:  Procedure Laterality Date  . APPENDECTOMY    . CYSTOSTOMY W/ BLADDER BIOPSY    . OVARIAN CYST SURGERY    . stent placed in heart      There were no vitals filed for this visit.  Subjective Assessment - 07/10/18 0849    Pertinent History  Prior strokes with Right hemiparesis, cerebellar strokes Feb 2019, MS    Currently in Pain?  Yes    Pain Score  5     Pain Location  Foot    Pain Orientation  Right    Pain Descriptors / Indicators  Aching    Pain Onset  In the past 7 days    Pain Frequency  Intermittent    Aggravating Factors   standing or walking on it    Pain Relieving Factors  rest, elevation                   OT Treatments/Exercises (OP) - 07/10/18 0001      Neurological Re-education Exercises   Other Exercises 1  Neuro re ed to address review with pt for what she has been  given for HEP for shoulder strengthening - initially pt unable to recall however once reviewed she remembered she had an HEP for her arm.  Had pt return demonstrate as pt fell and now has L rib fracture. Pt able to complete exercises in supine without pain.  Also issued putty exercises to address grp and pinch strength as well as extension of the hand and controlled movements of the hand. After instruction and significant repetition, pt able to return demonstate.  All information provided in written format and pt issued folder to keep exercises in to hopefully help with organization and visual reminder.               OT Education - 07/10/18 1107    Education Details  HEP for UE strength, grip and pinch    Person(s) Educated  Patient    Methods  Explanation;Demonstration;Verbal cues;Handout    Comprehension  Verbalized understanding;Returned demonstration       OT Short Term Goals - 07/10/18 1107      OT SHORT TERM GOAL #1  Title  Patient will complete HEP designed to improve AROM right shoulder and elbow with min assist due 07/06/18    Time  4    Period  Weeks    Status  Achieved      OT SHORT TERM GOAL #2   Title  Patient will demonstrate safe transfer with supervision into/out of bathroom without reliance on unstable objects for support (e.g. towel bar, door)    Time  4    Period  Weeks    Status  On-going      OT SHORT TERM GOAL #3   Title  Patient will don a front opening shirt or jacket with no more than set up assistance while standing (excludes buttons, zippers)    Time  4    Period  Weeks    Status  Achieved      OT SHORT TERM GOAL #4   Title  Patient will shower with modified independence    Time  4    Period  Weeks    Status  On-going      OT SHORT TERM GOAL #5   Title  Patient will complete HEP designed to improve right hand strength and coordination    Time  4    Period  Weeks    Status  On-going        OT Long Term Goals - 07/10/18 1107      OT LONG  TERM GOAL #1   Title  Patient will complete an updated HEP to help improve right UE range of motion and strength due 08/05/18    Time  8    Period  Weeks    Status  New      OT LONG TERM GOAL #2   Title  Patient will prepare a simple meal with min assist - using ambulation as primary mode of mobility    Time  8    Period  Weeks    Status  New      OT LONG TERM GOAL #3   Title  Patient will shop at grocery store with family member and close supervision - outing will last 20 minutes or greater.      Time  8    Period  Weeks    Status  New      OT LONG TERM GOAL #4   Title  Patient will complete simple homemaking tasks- washing dishes, making bed, dusting with close supervision    Time  8    Period  Weeks    Status  New      OT LONG TERM GOAL #5   Title  Patient will demonstrate at least 40 lb right grip strength to aide with functional grip, opening bottles, etc.      Time  8    Period  Weeks    Status  New      OT LONG TERM GOAL #6   Title  Patient will complete 9 hole peg test in 60 sec or less with right UE    Time  8    Period  Weeks    Status  New      OT LONG TERM GOAL #7   Title  Patient will demonstrate adequate range of motion and proximal strength in right UE to reach while standing to obtain 2 lb object from chest high shelf.      Time  8    Period  Weeks    Status  New  Plan - 07/10/18 1204    Clinical Impression Statement  Pt progressing toward goals. Pt tolerated therapy well today.     Occupational Profile and client history currently impacting functional performance  Mother, Nurse- on disability, church member    Occupational performance deficits (Please refer to evaluation for details):  ADL's;IADL's;Leisure;Work    Neurosurgeon    Current Impairments/barriers affecting progress:  recent fall with L rib fx    OT Frequency  2x / week    OT Duration  8 weeks    OT Treatment/Interventions  Self-care/ADL training;Electrical  Stimulation;Therapeutic exercise;Visual/perceptual remediation/compensation;Aquatic Therapy;Moist Heat;Neuromuscular education;Splinting;Patient/family education;Fluidtherapy;Energy conservation;Therapist, nutritional;Therapeutic activities;Balance training;Cryotherapy;Ultrasound;DME and/or AE instruction;Manual Therapy;Cognitive remediation/compensation    Plan  coordination HEP, further assess vision, functional use of RUE    Consulted and Agree with Plan of Care  Patient       Patient will benefit from skilled therapeutic intervention in order to improve the following deficits and impairments:  Decreased cognition, Impaired flexibility, Impaired vision/preception, Decreased coordination, Decreased mobility, Impaired sensation, Improper body mechanics, Decreased activity tolerance, Decreased endurance, Decreased range of motion, Decreased strength, Impaired tone, Decreased balance, Decreased knowledge of precautions, Decreased safety awareness, Impaired perceived functional ability, Impaired UE functional use, Decreased knowledge of use of DME  Visit Diagnosis: Hemiplegia and hemiparesis following cerebral infarction affecting right dominant side (HCC)  Muscle weakness (generalized)  Unsteadiness on feet  Other lack of coordination  Other symptoms and signs involving the nervous system    Problem List There are no active problems to display for this patient.   Quay Burow, OTR/L 07/10/2018, 12:09 PM  [facility] [address] [place], [place], [address] Phone: [phone]   Fax:  [phone]  Name: Hannah [name] MRN: [id number] Date of Birth: 01-24-1966

## 2018-07-10 NOTE — Patient Instructions (Signed)
Gaze Stabilization: Sitting    Keeping eyes on target on wall 3 feet away, and move head side to side for _30__ seconds. Repeat while moving head up and down for __30__ seconds. Do __2_ sessions per day.  Copyright  VHI. All rights reserved.   Gaze Stabilization: Tip Card  1.Target must remain in focus, not blurry, and appear stationary while head is in motion. 2.Perform exercises with small head movements (45 to either side of midline). 3.Increase speed of head motion so long as target is in focus. 4.If you wear eyeglasses, be sure you can see target through lens (therapist will give specific instructions for bifocal / progressive lenses). 5.These exercises may provoke dizziness or nausea. Work through these symptoms. If too dizzy, slow head movement slightly. Rest between each exercise. 6.Exercises demand concentration; avoid distractions.  Copyright  VHI. All rights reserved.     

## 2018-07-12 ENCOUNTER — Ambulatory Visit: Payer: Medicare Other | Admitting: Physical Therapy

## 2018-07-12 ENCOUNTER — Ambulatory Visit: Payer: Medicare Other | Admitting: Occupational Therapy

## 2018-07-18 ENCOUNTER — Ambulatory Visit: Payer: Medicare Other | Admitting: Occupational Therapy

## 2018-07-18 ENCOUNTER — Encounter: Payer: Self-pay | Admitting: Rehabilitation

## 2018-07-18 ENCOUNTER — Ambulatory Visit: Payer: Medicare Other | Attending: Family Medicine | Admitting: Rehabilitation

## 2018-07-18 DIAGNOSIS — I69351 Hemiplegia and hemiparesis following cerebral infarction affecting right dominant side: Secondary | ICD-10-CM

## 2018-07-18 DIAGNOSIS — R278 Other lack of coordination: Secondary | ICD-10-CM | POA: Insufficient documentation

## 2018-07-18 DIAGNOSIS — M6281 Muscle weakness (generalized): Secondary | ICD-10-CM

## 2018-07-18 DIAGNOSIS — R2681 Unsteadiness on feet: Secondary | ICD-10-CM

## 2018-07-18 DIAGNOSIS — R2689 Other abnormalities of gait and mobility: Secondary | ICD-10-CM | POA: Diagnosis present

## 2018-07-18 DIAGNOSIS — R29818 Other symptoms and signs involving the nervous system: Secondary | ICD-10-CM

## 2018-07-18 NOTE — Therapy (Addendum)
[facility] [address] [place], [place], [address] Phone: [phone]   Fax:  [phone]  Occupational Therapy Treatment  Patient Details  Name: Hannah [name] MRN: [id number] Date of Birth: 1966-01-30 Referring Provider (OT): Lennette Bihari Via   Encounter Date: 07/18/2018  OT End of Session - 07/18/18 0823    Visit Number  6    Number of Visits  17    Authorization Type  [employer] Medicare    Authorization Time Period  PN every 10th visit    Authorization - Visit Number  6    Authorization - Number of Visits  10    OT Start Time  0805    OT Stop Time  0845    OT Time Calculation (min)  40 min    Activity Tolerance  Patient tolerated treatment well    Behavior During Therapy  [facility] for tasks assessed/performed       Past Medical History:  Diagnosis Date  . Cerebral infarction ([place]) 07/12/2017   2009 first one, has had a total of 4 stokes  . Diabetes type 2, controlled ([place])   . Hemiparesis affecting right side as late effect of cerebrovascular accident (CVA) ([place])   . Hyperlipidemia   . Hypertension   . Multiple sclerosis ([place]) 2009  . Stroke [facility])     Past Surgical History:  Procedure Laterality Date  . APPENDECTOMY    . CYSTOSTOMY W/ BLADDER BIOPSY    . OVARIAN CYST SURGERY    . stent placed in heart      There were no vitals filed for this visit.  Subjective Assessment - 07/18/18 0829    Subjective   Pt reports pain in ribs    Pertinent History  Prior strokes with Right hemiparesis, cerebellar strokes Feb 2019, MS    Currently in Pain?  Yes    Pain Score  5     Pain Location  Rib cage    Pain Orientation  Left    Pain Descriptors / Indicators  Aching    Pain Type  Acute pain    Pain Onset  1 to 4 weeks ago    Pain Frequency  Intermittent    Aggravating Factors   movement    Pain Relieving Factors  rest                Treatment; closed chain shoulder flexion , abduction and extension as able  with cane performed within pain free ROM, min v.c/ facilitation. Standing to roll medium ball forwards and backwards for gentle weightbearing, and passive stretch, min v.c for all activities..           OT Education - 07/18/18 1746    Education Details  Coordination HEP.    Person(s) Educated  Patient;Child(ren)    Methods  Explanation;Demonstration;Verbal cues;Handout    Comprehension  Verbalized understanding;Returned demonstration       OT Short Term Goals - 07/10/18 1107      OT SHORT TERM GOAL #1   Title  Patient will complete HEP designed to improve AROM right shoulder and elbow with min assist due 07/06/18    Time  4    Period  Weeks    Status  Achieved      OT SHORT TERM GOAL #2   Title  Patient will demonstrate safe transfer with supervision into/out of bathroom without reliance on unstable objects for support (e.g. towel bar, door)    Time  4    Period  Weeks    Status  On-going      OT SHORT TERM GOAL #3   Title  Patient will don a front opening shirt or jacket with no more than set up assistance while standing (excludes buttons, zippers)    Time  4    Period  Weeks    Status  Achieved      OT SHORT TERM GOAL #4   Title  Patient will shower with modified independence    Time  4    Period  Weeks    Status  On-going      OT SHORT TERM GOAL #5   Title  Patient will complete HEP designed to improve right hand strength and coordination    Time  4    Period  Weeks    Status  On-going        OT Long Term Goals - 07/10/18 1107      OT LONG TERM GOAL #1   Title  Patient will complete an updated HEP to help improve right UE range of motion and strength due 08/05/18    Time  8    Period  Weeks    Status  New      OT LONG TERM GOAL #2   Title  Patient will prepare a simple meal with min assist - using ambulation as primary mode of mobility    Time  8    Period  Weeks    Status  New      OT LONG TERM GOAL #3   Title  Patient will shop at grocery store  with family member and close supervision - outing will last 20 minutes or greater.      Time  8    Period  Weeks    Status  New      OT LONG TERM GOAL #4   Title  Patient will complete simple homemaking tasks- washing dishes, making bed, dusting with close supervision    Time  8    Period  Weeks    Status  New      OT LONG TERM GOAL #5   Title  Patient will demonstrate at least 40 lb right grip strength to aide with functional grip, opening bottles, etc.      Time  8    Period  Weeks    Status  New      OT LONG TERM GOAL #6   Title  Patient will complete 9 hole peg test in 60 sec or less with right UE    Time  8    Period  Weeks    Status  New      OT LONG TERM GOAL #7   Title  Patient will demonstrate adequate range of motion and proximal strength in right UE to reach while standing to obtain 2 lb object from chest high shelf.      Time  8    Period  Weeks    Status  New            Plan - 07/18/18 1744    Clinical Impression Statement  Pt progressing slowly towards goals. she remains limited by rib pain.    Occupational performance deficits (Please refer to evaluation for details):  ADL's;IADL's;Leisure;Work    Rehab Potential  Good    OT Frequency  2x / week    OT Duration  8 weeks    OT Treatment/Interventions  Self-care/ADL training;Electrical Stimulation;Therapeutic exercise;Visual/perceptual remediation/compensation;Aquatic Therapy;Moist Heat;Neuromuscular education;Splinting;Patient/family  education;Fluidtherapy;Energy conservation;Therapist, nutritional;Therapeutic activities;Balance training;Cryotherapy;Ultrasound;DME and/or AE instruction;Manual Therapy;Cognitive remediation/compensation    Plan  progress coordination HEP, assess vision    Consulted and Agree with Plan of Care  Patient    Family Member Consulted  Vella Redhead- daughter       Patient will benefit from skilled therapeutic intervention in order to improve the following deficits and impairments:      Visit Diagnosis: Hemiplegia and hemiparesis following cerebral infarction affecting right dominant side (HCC)  Muscle weakness (generalized)  Other lack of coordination  Other symptoms and signs involving the nervous system    Problem List There are no active problems to display for this patient.   Shakura Cowing 07/18/2018, 5:46 PM  [facility] [address] [place], [place], [address] Phone: [phone]   Fax:  [phone]  Name: Cara Thaxton MRN: [id number] Date of Birth: 08-Jan-1966

## 2018-07-18 NOTE — Patient Instructions (Signed)
Access Code: [email or web address]  URL: [email or web address]  Date: 07/18/2018  Prepared by: Cameron Sprang   Exercises  Supine Bridge - 10 reps - 1 sets - 1x daily - 5x weekly  Seated Hamstring Stretch with Chair - 3 reps - 1 sets - 30 hold - 1x daily - 5x weekly  Sit to Stand with Armchair - 10 reps - 1 sets - 1x daily - 5x weekly  Side Stepping with Counter Support - 3 reps - 1 sets - 1x daily - 5x weekly  Standing Single Leg Stance with Counter Support - 3 reps - 1 sets - 20 sec hold - 1x daily - 5x weekly  Standing Tandem Balance with Counter Support - 2-3 reps - 1 sets - 15 sec hold - 1x daily - 5x weekly

## 2018-07-18 NOTE — Patient Instructions (Signed)
  Coordination Activities  Perform the following activities for 20 minutes 1 times per day with right hand(s).   Rotate ball in fingertips (clockwise and counter-clockwise).  Flip cards 1 at a time as fast as you can.  Pick up coins, buttons, marbles, dried beans/pasta of different sizes and place in container.  Pick up coins one at a time until you get 5-10 in your hand, then move coins from palm to fingertips to stack one at a time.  Practice writing and/or typing.

## 2018-07-18 NOTE — Therapy (Signed)
[facility] [address] [place], [place], [address] Phone: [phone]   Fax:  [phone]  Physical Therapy Treatment  Patient Details  Name: Hannah [name] MRN: [id number] Date of Birth: 22-Apr-1966 Referring Provider (PT): Harlene Ramus, MD   Encounter Date: 07/18/2018  PT End of Session - 07/18/18 1118    Visit Number  12   10th visit note performed on visit #9; write again on visit 19   Number of Visits  25    Date for PT Re-Evaluation  08/11/18    Authorization Type  [employer] Medicare - 10th visit PN (write on visit #19)    PT Start Time  0847    PT Stop Time  0930    PT Time Calculation (min)  43 min    Activity Tolerance  Patient tolerated treatment well    Behavior During Therapy  [facility] for tasks assessed/performed       Past Medical History:  Diagnosis Date  . Cerebral infarction ([place]) 07/12/2017   2009 first one, has had a total of 4 stokes  . Diabetes type 2, controlled ([place])   . Hemiparesis affecting right side as late effect of cerebrovascular accident (CVA) ([place])   . Hyperlipidemia   . Hypertension   . Multiple sclerosis ([place]) 2009  . Stroke [facility])     Past Surgical History:  Procedure Laterality Date  . APPENDECTOMY    . CYSTOSTOMY W/ BLADDER BIOPSY    . OVARIAN CYST SURGERY    . stent placed in heart      There were no vitals filed for this visit.  Subjective Assessment - 07/18/18 0850    Subjective  Reports she had a fall following the foot issue starting.  Reports she cracked rib on the L.      Patient is accompained by:  Family member    Pertinent History  4th stroke, MS, diabetic    Limitations  Walking;House hold activities;Lifting;Standing;Writing;Reading    Diagnostic tests  Recent MRI showed old MS lesions on both side of brain but no new lesions    Patient Stated Goals  balance, walking better, get stronger, stairs    Currently in Pain?  Yes    Pain Score  3     Pain Location   Foot    Pain Orientation  Right    Pain Descriptors / Indicators  Dull;Nagging    Pain Type  Acute pain    Pain Onset  1 to 4 weeks ago    Pain Frequency  Intermittent    Aggravating Factors   walking    Pain Relieving Factors  moving foot, Tylenol                       [facility] Adult PT Treatment/Exercise - 07/18/18 0900      Transfers   Transfers  Sit to Stand;Stand to Sit    Five time sit to stand comments   37.4 secs without UE for 3 reps, single UE for 2 reps      Ambulation/Gait   Gait velocity  1.61 ft/sec with rollator       Standardized Balance Assessment   Standardized Balance Assessment  Berg Balance Test      Berg Balance Test   Sit to Stand  Able to stand without using hands and stabilize independently    Standing Unsupported  Able to stand safely 2 minutes    Sitting with Back Unsupported but Feet Supported  on Floor or Stool  Able to sit safely and securely 2 minutes    Stand to Sit  Sits safely with minimal use of hands    Transfers  Able to transfer safely, minor use of hands    Standing Unsupported with Eyes Closed  Able to stand 10 seconds with supervision    Standing Ubsupported with Feet Together  Needs help to attain position but able to stand for 30 seconds with feet together    From Standing, Reach Forward with Outstretched Arm  Can reach forward >12 cm safely (5")   7   From Standing Position, Pick up Object from Floor  Unable to pick up shoe, but reaches 2-5 cm (1-2") from shoe and balances independently    From Standing Position, Turn to Look Behind Over each Shoulder  Looks behind one side only/other side shows less weight shift    Turn 360 Degrees  Needs close supervision or verbal cueing    Standing Unsupported, Alternately Place Feet on Step/Stool  Able to complete >2 steps/needs minimal assist    Standing Unsupported, One Foot in Front  Able to plae foot ahead of the other independently and hold 30 seconds    Standing on One Leg  Tries  to lift leg/unable to hold 3 seconds but remains standing independently    Total Score  38         Access Code: [email or web address]  URL: [email or web address]  Date: 07/18/2018  Prepared by: Cameron Sprang   Exercises  Supine Bridge - 10 reps - 1 sets - 1x daily - 5x weekly  Seated Hamstring Stretch with Chair - 3 reps - 1 sets - 30 hold - 1x daily - 5x weekly  Sit to Stand with Armchair - 10 reps - 1 sets - 1x daily - 5x weekly  Side Stepping with Counter Support - 3 reps - 1 sets - 1x daily - 5x weekly  Standing Single Leg Stance with Counter Support - 3 reps - 1 sets - 20 sec hold - 1x daily - 5x weekly  Standing Tandem Balance with Counter Support - 2-3 reps - 1 sets - 15 sec hold - 1x daily - 5x weekly         PT Short Term Goals - 07/18/18 0853      PT SHORT TERM GOAL #1   Title  Pt will perform HEP with supervision    Baseline  Has not been performing consistently     Time  4    Period  Weeks    Status  Not Met    Target Date  07/12/18      PT SHORT TERM GOAL #2   Title  Pt will improve LE strength as indicated by five time sit to stance to </= 40 seconds without UE support or assistance from therapist    Baseline  36 with min A from therapist to shift weight forwards, to 37.4 secs on 07/18/18    Time  4    Period  Weeks    Status  Achieved    Target Date  07/12/18      PT SHORT TERM GOAL #3   Title  Pt will improve BERG balance to >/= 35/56    Baseline  declined to 30/56, improved to 38/56 on 07/18/18    Time  4    Period  Weeks    Status  Achieved    Target Date  07/12/18      PT SHORT TERM  GOAL #4   Title  Pt will improve gait velocity with rollator to >/= 1.8 ft/sec    Baseline  declined to 1.75 ft/sec with rollator, 1.61 ft/sec on 07/17/18    Time  4    Period  Weeks    Status  Not Met    Target Date  07/12/18      PT SHORT TERM GOAL #5   Title  Pt to participate in full vestibular assessment    Baseline  Primary PT has addressed     Time  4     Period  Weeks    Status  Achieved    Target Date  07/12/18        PT Long Term Goals - 07/18/18 1116      PT LONG TERM GOAL #1   Title  Pt and daughter will demonstrate independence with HEP     Time  8    Period  Weeks    Status  On-going      PT LONG TERM GOAL #2   Title  Pt will perform community ambulation x 500 feet with rollator while negotiating a curb and ramp to improve safety and endurance in the community with supervision    Time  8    Period  Weeks    Status  On-going      PT LONG TERM GOAL #3   Title  Pt will negotiate safely 10 stairs forwards to ascend, backwards to descend with L rail and supervision    Baseline  Min A for 8 stairs with L rail    Time  8    Period  Weeks    Status  On-going      PT LONG TERM GOAL #4   Title  Pt will improve five time sit to stand to </= 33 seconds without use of UE with supervision (no assistance from therapist)    Baseline  36 seconds with min A from therapist to weight shift forwards    Time  8    Period  Weeks    Status  Revised      PT LONG TERM GOAL #5   Title  Pt will improve BERG to >/= 41/56    Time  8    Period  Weeks    Status  Revised      PT LONG TERM GOAL #6   Title  Pt will improve gait velocity with rollator and appropriate AFO to >/= 2.2 ft/sec    Time  8    Period  Weeks    Status  Revised      PT LONG TERM GOAL #7   Title  vestibular goal as indicated            Plan - 07/18/18 1115    Clinical Impression Statement  Pt has met 3/5 STGs, however did not meet goal for HEP as she has been sick and foot not feeling well, therefore has not been performing consistently at home.  Also did not meet goal for gait speed, however due to increased tone in RLE, feel that increased gait speed would increase fall risk.  Reviewed HEP during session and feel they are still appropriate.      Rehab Potential  Good    Clinical Impairments Affecting Rehab Potential  four strokes in 10 years with significant  change in status with recent stroke, PMH of MS, diabetes    PT Frequency  2x / week    PT  Duration  8 weeks    PT Treatment/Interventions  ADLs/Self Care Home Management;DME Instruction;Gait training;Stair training;Functional mobility training;Therapeutic activities;Therapeutic exercise;Balance training;Neuromuscular re-education;Patient/family education;Orthotic Fit/Training;Manual techniques;Passive range of motion;Energy conservation;Visual/perceptual remediation/compensation;Canalith Repostioning;Electrical Stimulation;Vestibular    PT Next Visit Plan   Sit > stand with more forward weight shift. continued to gait train with quad cane focusing on control of forward momentum; Balance with turns without UE support - PWR exercises for rotation!!    PT Home Exercise Plan  [email or web address]     Consulted and Agree with Plan of Care  Patient;Family member/caregiver    Family Member Consulted  daughter       Patient will benefit from skilled therapeutic intervention in order to improve the following deficits and impairments:  Abnormal gait, Decreased coordination, Decreased range of motion, Difficulty walking, Impaired tone, Decreased endurance, Decreased safety awareness, Increased muscle spasms, Impaired UE functional use, Decreased activity tolerance, Impaired vision/preception, Decreased balance, Decreased knowledge of use of DME, Hypomobility, Improper body mechanics, Decreased cognition, Decreased mobility, Decreased strength, Dizziness  Visit Diagnosis: Hemiplegia and hemiparesis following cerebral infarction affecting right dominant side (HCC)  Muscle weakness (generalized)  Unsteadiness on feet  Other symptoms and signs involving the nervous system  Other abnormalities of gait and mobility     Problem List There are no active problems to display for this patient.   Cameron Sprang, PT, MPT [facility] [address] [place], [place],  [address] Phone: [phone]   Fax:  [phone] 07/18/18, 11:20 AM  Name: Sherryann Frese MRN: [id number] Date of Birth: 29-Aug-1965

## 2018-07-21 ENCOUNTER — Ambulatory Visit: Payer: Medicare Other | Admitting: Physical Therapy

## 2018-07-21 ENCOUNTER — Ambulatory Visit: Payer: Medicare Other | Admitting: Occupational Therapy

## 2018-07-21 ENCOUNTER — Encounter: Payer: Self-pay | Admitting: Physical Therapy

## 2018-07-21 DIAGNOSIS — R2689 Other abnormalities of gait and mobility: Secondary | ICD-10-CM

## 2018-07-21 DIAGNOSIS — I69351 Hemiplegia and hemiparesis following cerebral infarction affecting right dominant side: Secondary | ICD-10-CM

## 2018-07-21 DIAGNOSIS — R2681 Unsteadiness on feet: Secondary | ICD-10-CM

## 2018-07-21 DIAGNOSIS — M6281 Muscle weakness (generalized): Secondary | ICD-10-CM

## 2018-07-21 DIAGNOSIS — R29818 Other symptoms and signs involving the nervous system: Secondary | ICD-10-CM

## 2018-07-21 NOTE — Therapy (Signed)
[facility] [address] [place], [place], [address] Phone: [phone]   Fax:  [phone]  Physical Therapy Treatment  Patient Details  Name: Hannah [name] MRN: [id number] Date of Birth: 07-30-1965 Referring Provider (PT): Harlene Ramus, MD   Encounter Date: 07/21/2018  PT End of Session - 07/21/18 1018    Visit Number  13   10th visit note performed on visit #9; write again on visit 19   Number of Visits  25    Date for PT Re-Evaluation  08/11/18    Authorization Type  [employer] Medicare - 10th visit PN (write on visit #19)    PT Start Time  1017    PT Stop Time  1059    PT Time Calculation (min)  42 min    Equipment Utilized During Treatment  Gait belt    Activity Tolerance  Patient tolerated treatment well    Behavior During Therapy  WFL for tasks assessed/performed       Past Medical History:  Diagnosis Date  . Cerebral infarction ([place]) 07/12/2017   2009 first one, has had a total of 4 stokes  . Diabetes type 2, controlled ([place])   . Hemiparesis affecting right side as late effect of cerebrovascular accident (CVA) ([place])   . Hyperlipidemia   . Hypertension   . Multiple sclerosis ([place]) 2009  . Stroke [facility])     Past Surgical History:  Procedure Laterality Date  . APPENDECTOMY    . CYSTOSTOMY W/ BLADDER BIOPSY    . OVARIAN CYST SURGERY    . stent placed in heart      There were no vitals filed for this visit.  Subjective Assessment - 07/21/18 1018    Subjective  No new complaints. No falls or pain to report.     Patient is accompained by:  Family member    Pertinent History  4th stroke, MS, diabetic    Limitations  Walking;House hold activities;Lifting;Standing;Writing;Reading    Diagnostic tests  Recent MRI showed old MS lesions on both side of brain but no new lesions    Patient Stated Goals  balance, walking better, get stronger, stairs    Currently in Pain?  No/denies          [facility] Adult PT  Treatment/Exercise - 07/21/18 1019      Transfers   Transfers  Sit to Stand;Stand to Sit    Sit to Stand  5: Supervision;With upper extremity assist;From bed;From chair/3-in-1;4: Min guard    Stand to Sit  5: Supervision;4: Min guard;With upper extremity assist;To bed;To chair/3-in-1      Knee/Hip Exercises: Stretches   Passive Hamstring Stretch  Both;2 reps;60 seconds;Limitations    Passive Hamstring Stretch Limitations  in supine to each leg with PTA passively providing stretch, increasing the range as hamstring relaxed over a prolonged stretch to each leg.       Knee/Hip Exercises: Supine   Bridges  AROM;Strengthening;1 set;Both;10 reps;Limitations    Bridges Limitations  bil LE's with feet on mat: 5 sec holds x10 reps with arms across chest, cues for full hip rise; then single leg bridge with one leg over stool, other foot on mat table for 10 reps each side, cues for full hip rise off mat.     Single Leg Bridge  AROM;Strengthening;Both;1 set;10 reps;Limitations   leg propped on stool         Balance Exercises - 07/21/18 1032      Balance Exercises: Standing   Standing  Eyes Closed  Wide (BOA);Head turns;Foam/compliant surface;Other reps (comment);30 secs;Limitations    SLS with Vectors  Solid surface;Foam/compliant surface;Upper extremity assist 1;Other reps (comment);Limitations    Other Standing Exercises  standing on airex with light UE support on back of chair: 3 way kicks (fwd, lateral, bwd) x 5 reps each leg.  cues for posture, weight shifting and ex form.       Balance Exercises: Standing   Standing Eyes Closed Limitations  on airex with feet hip width apart and chair in front for safety, no UE support: EC no head movements, progressing to EC head movements left<>right, then up<>down with cues on posture and weight shifting to assist with balance.      SLS with Vectors Limitations  on floor, then on 1 inch foam: 2 foam bubbles on floor- alternaing fwd toe taps, then  alternating cross toe taps. 10 reps each on each surface with HHA only, min to mod assist with cues on posture, stance position and weight shifting to assist with balance.            PT Short Term Goals - 07/18/18 0853      PT SHORT TERM GOAL #1   Title  Pt will perform HEP with supervision    Baseline  Has not been performing consistently     Time  4    Period  Weeks    Status  Not Met    Target Date  07/12/18      PT SHORT TERM GOAL #2   Title  Pt will improve LE strength as indicated by five time sit to stance to </= 40 seconds without UE support or assistance from therapist    Baseline  36 with min A from therapist to shift weight forwards, to 37.4 secs on 07/18/18    Time  4    Period  Weeks    Status  Achieved    Target Date  07/12/18      PT SHORT TERM GOAL #3   Title  Pt will improve BERG balance to >/= 35/56    Baseline  declined to 30/56, improved to 38/56 on 07/18/18    Time  4    Period  Weeks    Status  Achieved    Target Date  07/12/18      PT SHORT TERM GOAL #4   Title  Pt will improve gait velocity with rollator to >/= 1.8 ft/sec    Baseline  declined to 1.75 ft/sec with rollator, 1.61 ft/sec on 07/17/18    Time  4    Period  Weeks    Status  Not Met    Target Date  07/12/18      PT SHORT TERM GOAL #5   Title  Pt to participate in full vestibular assessment    Baseline  Primary PT has addressed     Time  4    Period  Weeks    Status  Achieved    Target Date  07/12/18        PT Long Term Goals - 07/18/18 1116      PT LONG TERM GOAL #1   Title  Pt and daughter will demonstrate independence with HEP     Time  8    Period  Weeks    Status  On-going      PT LONG TERM GOAL #2   Title  Pt will perform community ambulation x 500 feet with rollator while negotiating a curb and  ramp to improve safety and endurance in the community with supervision    Time  8    Period  Weeks    Status  On-going      PT LONG TERM GOAL #3   Title  Pt will negotiate  safely 10 stairs forwards to ascend, backwards to descend with L rail and supervision    Baseline  Min A for 8 stairs with L rail    Time  8    Period  Weeks    Status  On-going      PT LONG TERM GOAL #4   Title  Pt will improve five time sit to stand to </= 33 seconds without use of UE with supervision (no assistance from therapist)    Baseline  36 seconds with min A from therapist to weight shift forwards    Time  8    Period  Weeks    Status  Revised      PT LONG TERM GOAL #5   Title  Pt will improve BERG to >/= 41/56    Time  8    Period  Weeks    Status  Revised      PT LONG TERM GOAL #6   Title  Pt will improve gait velocity with rollator and appropriate AFO to >/= 2.2 ft/sec    Time  8    Period  Weeks    Status  Revised      PT LONG TERM GOAL #7   Title  vestibular goal as indicated            Plan - 07/21/18 1018    Clinical Impression Statement  Today's skilled session focused on LE stretching and balance reactions with only fatigue reported. This was relieved with rest breaks. The pt is progressing toward goals and should benefit from continued PT to progress toward unmet goals.     Rehab Potential  Good    Clinical Impairments Affecting Rehab Potential  four strokes in 10 years with significant change in status with recent stroke, PMH of MS, diabetes    PT Frequency  2x / week    PT Duration  8 weeks    PT Treatment/Interventions  ADLs/Self Care Home Management;DME Instruction;Gait training;Stair training;Functional mobility training;Therapeutic activities;Therapeutic exercise;Balance training;Neuromuscular re-education;Patient/family education;Orthotic Fit/Training;Manual techniques;Passive range of motion;Energy conservation;Visual/perceptual remediation/compensation;Canalith Repostioning;Electrical Stimulation;Vestibular    PT Next Visit Plan   Sit > stand with more forward weight shift. continued to gait train with quad cane focusing on control of forward  momentum; Balance with turns without UE support - PWR exercises for rotation!! Add more visits as primary PT does plan to recert.     PT Home Exercise Plan  [email or web address]     Consulted and Agree with Plan of Care  Patient;Family member/caregiver    Family Member Consulted  daughter       Patient will benefit from skilled therapeutic intervention in order to improve the following deficits and impairments:  Abnormal gait, Decreased coordination, Decreased range of motion, Difficulty walking, Impaired tone, Decreased endurance, Decreased safety awareness, Increased muscle spasms, Impaired UE functional use, Decreased activity tolerance, Impaired vision/preception, Decreased balance, Decreased knowledge of use of DME, Hypomobility, Improper body mechanics, Decreased cognition, Decreased mobility, Decreased strength, Dizziness  Visit Diagnosis: Hemiplegia and hemiparesis following cerebral infarction affecting right dominant side (HCC)  Muscle weakness (generalized)  Unsteadiness on feet  Other abnormalities of gait and mobility  Other symptoms and signs involving the nervous system  Problem List There are no active problems to display for this patient.   Willow Ora, PTA, [facility] [address] [place], [place] [address] [phone] 07/21/18, 3:24 PM   Name: Hannah [name] MRN: [id number] Date of Birth: 1966-01-01

## 2018-07-21 NOTE — Therapy (Signed)
[facility] [address] [place], [place], [address] Phone: [phone]   Fax:  [phone]  Occupational Therapy Treatment  Patient Details  Name: Hannah [name] MRN: [id number] Date of Birth: 02/27/1966 Referring Provider (OT): Lennette Bihari Via   Encounter Date: 07/21/2018  OT End of Session - 07/21/18 0939    Visit Number  7    Number of Visits  [place] [employer]    Authorization - Visit Number  7    Authorization - Number of Visits  10    OT Start Time  3710    OT Stop Time  6269    OT Time Calculation (min)  40 min       Past Medical History:  Diagnosis Date  . Cerebral infarction ([place]) 07/12/2017   2009 first one, has had a total of 4 stokes  . Diabetes type 2, controlled ([place])   . Hemiparesis affecting right side as late effect of cerebrovascular accident (CVA) ([place])   . Hyperlipidemia   . Hypertension   . Multiple sclerosis ([place]) 2009  . Stroke [facility])     Past Surgical History:  Procedure Laterality Date  . APPENDECTOMY    . CYSTOSTOMY W/ BLADDER BIOPSY    . OVARIAN CYST SURGERY    . stent placed in heart      There were no vitals filed for this visit.  Subjective Assessment - 07/21/18 0939    Pertinent History  Prior strokes with Right hemiparesis, cerebellar strokes Feb 2019, MS    Currently in Pain?  No/denies          Treatment: Reviewed activities from coordination HEP: flipping cards, stacking pennies and manipulating coins in hand to place in container min-mod difficulty and min v.c, Pt attempted to deal cards yet she was unable.Increased time required for all activities. Pt required rest breaks due to fatigue.Marland Kitchen Placing and removing graded clothespins from vertical antennae for increased functional reach, and pinch strength min difficulty/ v.c                   OT Short Term Goals - 07/21/18 0947      OT SHORT TERM GOAL #1   Title  Patient will  complete HEP designed to improve AROM right shoulder and elbow with min assist due 07/06/18    Time  4    Period  Weeks    Status  Achieved      OT SHORT TERM GOAL #2   Title  Patient will demonstrate safe transfer with supervision into/out of bathroom without reliance on unstable objects for support (e.g. towel bar, door)    Time  4    Period  Weeks    Status  On-going      OT SHORT TERM GOAL #3   Title  Patient will don a front opening shirt or jacket with no more than set up assistance while standing (excludes buttons, zippers)    Time  4    Period  Weeks    Status  Achieved      OT SHORT TERM GOAL #4   Title  Patient will shower with modified independence    Time  4    Period  Weeks    Status  On-going   supervision     OT SHORT TERM GOAL #5   Title  Patient will complete HEP designed to improve right hand strength and coordination    Time  4  Period  Weeks    Status  On-going        OT Long Term Goals - 07/21/18 0951      OT LONG TERM GOAL #1   Title  Patient will complete an updated HEP to help improve right UE range of motion and strength due 08/05/18    Time  8    Period  Weeks    Status  New      OT LONG TERM GOAL #2   Title  Patient will prepare a simple meal with min assist - using ambulation as primary mode of mobility    Time  8    Period  Weeks    Status  New      OT LONG TERM GOAL #3   Title  Patient will shop at grocery store with family member and close supervision - outing will last 20 minutes or greater.      Time  8    Period  Weeks    Status  New      OT LONG TERM GOAL #4   Title  Patient will complete simple homemaking tasks- washing dishes, making bed, dusting with close supervision    Time  8    Period  Weeks    Status  New      OT LONG TERM GOAL #5   Title  Patient will demonstrate at least 40 lb right grip strength to aide with functional grip, opening bottles, etc.      Time  8    Period  Weeks    Status  New      OT LONG  TERM GOAL #6   Title  Patient will complete 9 hole peg test in 60 sec or less with right UE    Time  8    Period  Weeks    Status  New      OT LONG TERM GOAL #7   Title  Patient will demonstrate adequate range of motion and proximal strength in right UE to reach while standing to obtain 2 lb object from chest high shelf.      Time  8    Period  Weeks    Status  New            Plan - 07/21/18 1623    Clinical Impression Statement  Pt is progressing towards goals with improving perfromance of fine motor coordination tasks.    Occupational performance deficits (Please refer to evaluation for details):  ADL's;IADL's;Leisure;Work    Rehab Potential  Good    OT Frequency  2x / week    OT Duration  8 weeks    OT Treatment/Interventions  Self-care/ADL training;Electrical Stimulation;Therapeutic exercise;Visual/perceptual remediation/compensation;Aquatic Therapy;Moist Heat;Neuromuscular education;Splinting;Patient/family education;Fluidtherapy;Energy conservation;Therapist, nutritional;Therapeutic activities;Balance training;Cryotherapy;Ultrasound;DME and/or AE instruction;Manual Therapy;Cognitive remediation/compensation    Plan  continue to work towards unmet goals, consider adding additional visit.    Consulted and Agree with Plan of Care  Patient    Family Member Consulted  Vella Redhead- daughter       Patient will benefit from skilled therapeutic intervention in order to improve the following deficits and impairments:     Visit Diagnosis: Hemiplegia and hemiparesis following cerebral infarction affecting right dominant side (HCC)  Muscle weakness (generalized)  Unsteadiness on feet  Other abnormalities of gait and mobility    Problem List There are no active problems to display for this patient.   Sadarius Norman 07/21/2018, 4:53 PM  [facility] [address]  [place], [place], [address] Phone: [phone]   Fax:   [phone]  Name: Hannah [name] MRN: [id number] Date of Birth: 1966-02-18

## 2018-07-25 ENCOUNTER — Ambulatory Visit: Payer: Medicare Other | Admitting: Occupational Therapy

## 2018-07-25 ENCOUNTER — Encounter: Payer: Self-pay | Admitting: Physical Therapy

## 2018-07-25 ENCOUNTER — Ambulatory Visit: Payer: Medicare Other | Admitting: Physical Therapy

## 2018-07-25 DIAGNOSIS — R2681 Unsteadiness on feet: Secondary | ICD-10-CM

## 2018-07-25 DIAGNOSIS — I69351 Hemiplegia and hemiparesis following cerebral infarction affecting right dominant side: Secondary | ICD-10-CM

## 2018-07-25 DIAGNOSIS — M6281 Muscle weakness (generalized): Secondary | ICD-10-CM

## 2018-07-25 DIAGNOSIS — R2689 Other abnormalities of gait and mobility: Secondary | ICD-10-CM

## 2018-07-25 DIAGNOSIS — R29818 Other symptoms and signs involving the nervous system: Secondary | ICD-10-CM

## 2018-07-26 NOTE — Therapy (Signed)
[facility] [address] [place], [place], [address] Phone: [phone]   Fax:  [phone]  Occupational Therapy Treatment  Patient Details  Name: Hannah [name] MRN: [id number] Date of Birth: March 16, 1966 Referring Provider (OT): Lennette Bihari Via   Encounter Date: 07/25/2018  OT End of Session - 07/26/18 0949    Visit Number  8    Number of Visits  [place] Medicare    Authorization - Visit Number  8    Authorization - Number of Visits  10    OT Start Time  [phone]    OT Stop Time  0845    OT Time Calculation (min)  39 min       Past Medical History:  Diagnosis Date  . Cerebral infarction ([place]) 07/12/2017   2009 first one, has had a total of 4 stokes  . Diabetes type 2, controlled ([place])   . Hemiparesis affecting right side as late effect of cerebrovascular accident (CVA) ([place])   . Hyperlipidemia   . Hypertension   . Multiple sclerosis ([place]) 2009  . Stroke [facility])     Past Surgical History:  Procedure Laterality Date  . APPENDECTOMY    . CYSTOSTOMY W/ BLADDER BIOPSY    . OVARIAN CYST SURGERY    . stent placed in heart      There were no vitals filed for this visit.        Treatment: Seated leaning forwards to roll large ball forward and back wards with bilateral UES', min v.c followed by mid range closed chain shoulder flexion  Min v.c facilitation for RUE. Simple cooking task to fry and egg. Pt located items and transported to stove, pt cracked the egg using both hands. Pt demonstrates good safety overall. She experienced min-mod difficulty flipping the egg using her RUE however she was able to complete safely. Pt safely transported items along countertop to dishwasher, min v.c overall for performance of this task. Pt was instructed to try at home with her dtr.                OT Education - 07/26/18 0948    Education Details  safety for cooking with supervision    Person(s)  Educated  Patient;Child(ren)    Methods  Explanation;Demonstration;Verbal cues;Handout    Comprehension  Verbalized understanding;Returned demonstration       OT Short Term Goals - 07/25/18 0815      OT SHORT TERM GOAL #1   Title  Patient will complete HEP designed to improve AROM right shoulder and elbow with min assist due 07/06/18  (Pended)     Time  4  (Pended)     Period  Weeks  (Pended)     Status  Achieved  (Pended)       OT SHORT TERM GOAL #2   Title  Patient will demonstrate safe transfer with supervision into/out of bathroom without reliance on unstable objects for support (e.g. towel bar, door)  (Pended)     Time  4  (Pended)     Period  Weeks  (Pended)     Status  On-going  (Pended)       OT SHORT TERM GOAL #3   Title  Patient will don a front opening shirt or jacket with no more than set up assistance while standing (excludes buttons, zippers)  (Pended)     Time  4  (Pended)     Period  Weeks  (Pended)  Status  Achieved  (Pended)       OT SHORT TERM GOAL #4   Title  Patient will shower with modified independence  (Pended)     Time  4  (Pended)     Period  Weeks  (Pended)     Status  On-going  (Pended)    supervision     OT SHORT TERM GOAL #5   Title  Patient will complete HEP designed to improve right hand strength and coordination  (Pended)     Time  4  (Pended)     Period  Weeks  (Pended)     Status  Achieved  (Pended)         OT Long Term Goals - 07/21/18 0951      OT LONG TERM GOAL #1   Title  Patient will complete an updated HEP to help improve right UE range of motion and strength due 08/05/18    Time  8    Period  Weeks    Status  New      OT LONG TERM GOAL #2   Title  Patient will prepare a simple meal with min assist - using ambulation as primary mode of mobility    Time  8    Period  Weeks    Status  New      OT LONG TERM GOAL #3   Title  Patient will shop at grocery store with family member and close supervision - outing will last 20  minutes or greater.      Time  8    Period  Weeks    Status  New      OT LONG TERM GOAL #4   Title  Patient will complete simple homemaking tasks- washing dishes, making bed, dusting with close supervision    Time  8    Period  Weeks    Status  New      OT LONG TERM GOAL #5   Title  Patient will demonstrate at least 40 lb right grip strength to aide with functional grip, opening bottles, etc.      Time  8    Period  Weeks    Status  New      OT LONG TERM GOAL #6   Title  Patient will complete 9 hole peg test in 60 sec or less with right UE    Time  8    Period  Weeks    Status  New      OT LONG TERM GOAL #7   Title  Patient will demonstrate adequate range of motion and proximal strength in right UE to reach while standing to obtain 2 lb object from chest high shelf.      Time  8    Period  Weeks    Status  New            Plan - 07/26/18 0950    Clinical Impression Statement  Pt demonstrates good overall progress. She perfromed a simple cooking task with supervision and min v.c.    Occupational performance deficits (Please refer to evaluation for details):  ADL's;IADL's;Leisure;Work    Microbiologist  Several treatment options, min-mod task modification necessary    OT Frequency  2x / week    OT Duration  8 weeks    OT Treatment/Interventions  Self-care/ADL training;Electrical Stimulation;Therapeutic exercise;Visual/perceptual remediation/compensation;Aquatic Therapy;Moist Heat;Neuromuscular education;Splinting;Patient/family education;Fluidtherapy;Energy conservation;Therapist, nutritional;Therapeutic activities;Balance training;Cryotherapy;Ultrasound;DME and/or AE instruction;Manual  Therapy;Cognitive remediation/compensation    Plan  continue to work towards unmet goals, education with pt's dtr regarding safety for cooking    Consulted and Agree with Plan of Care  Patient       Patient will benefit from skilled therapeutic  intervention in order to improve the following deficits and impairments:     Visit Diagnosis: Hemiplegia and hemiparesis following cerebral infarction affecting right dominant side (HCC)  Muscle weakness (generalized)  Unsteadiness on feet  Other symptoms and signs involving the nervous system  Other abnormalities of gait and mobility    Problem List There are no active problems to display for this patient.   Tamarcus Condie 07/26/2018, 9:53 AM  [facility] [address] [place], [place], [address] Phone: [phone]   Fax:  [phone]  Name: Aiko Belko MRN: [id number] Date of Birth: 02-Oct-1965

## 2018-07-26 NOTE — Therapy (Signed)
[facility] [address] [place], [place], [address] Phone: [phone]   Fax:  [phone]  Physical Therapy Treatment  Patient Details  Name: Dayanara Sherrill MRN: [id number] Date of Birth: [birth year] Referring Provider (PT): Harlene Ramus, MD   Encounter Date: 07/25/2018     07/25/18 0851  PT Visits / Re-Eval  Visit Number 14 (10th visit note performed on visit #9; write again on visit 19)  Number of Visits 25  Date for PT Re-Evaluation 08/11/18  Authorization  Authorization Type [employer] - 10th visit PN (write on visit #19)  PT Time Calculation  PT Start Time 0847  PT Stop Time 0930  PT Time Calculation (min) 43 min  PT - End of Session  Equipment Utilized During Treatment Gait belt  Activity Tolerance Patient tolerated treatment well  Behavior During Therapy [facility] for tasks assessed/performed    Past Medical History:  Diagnosis Date  . Cerebral infarction ([place]) 07/12/2017   2009 first one, has had a total of 4 stokes  . Diabetes type 2, controlled ([place])   . Hemiparesis affecting right side as late effect of cerebrovascular accident (CVA) ([place])   . Hyperlipidemia   . Hypertension   . Multiple sclerosis ([place]) 2009  . Stroke [facility])     Past Surgical History:  Procedure Laterality Date  . APPENDECTOMY    . CYSTOSTOMY W/ BLADDER BIOPSY    . OVARIAN CYST SURGERY    . stent placed in heart      There were no vitals filed for this visit.      07/25/18 0851  Symptoms/Limitations  Subjective No new complaints. No falls or pain to report.   Patient is accompained by: Family member  Pertinent History 4th stroke, MS, diabetic  Limitations Walking;House hold activities;Lifting;Standing;Writing;Reading  Diagnostic tests Recent MRI showed old MS lesions on both side of brain but no new lesions  Patient Stated Goals balance, walking better, get stronger, stairs  Pain Assessment  Currently in Pain?  No/denies  Pain Score 0       07/25/18 0852  Transfers  Transfers Sit to Stand;Stand to Sit  Sit to Stand 5: Supervision;With upper extremity assist;From bed;From chair/3-in-1;4: Min guard  Stand to Sit 5: Supervision;4: Min guard;With upper extremity assist;To bed;To chair/3-in-1  Ambulation/Gait  Ambulation/Gait Yes  Ambulation/Gait Assistance 4: Min guard;5: Supervision  Ambulation/Gait Assistance Details cues on posture, rollator position and bil step length.  Ambulation Distance (Feet) 230 Feet (x2 )  Assistive device Rollator  Gait Pattern Step-through pattern;Decreased step length - right;Decreased step length - left;Decreased stride length;Trunk flexed  Ambulation Surface Level;Indoor      07/25/18 0906  Balance Exercises: Standing  SLS with Vectors Foam/compliant surface;Upper extremity assist 2;Other reps (comment);Limitations  Rockerboard Anterior/posterior;Lateral;Head turns;EO;EC;30 seconds;10 reps  Balance Exercises: Standing  Rebounder Limitations performed both ways on balance board with no UE support: rocking the board with EO emphasis on tall posture; holding the board steady- EC no head movements, progressing to EC head movements left<>right, then up<>down. min to mod assist with light UE support on bars.    SLS with Vectors Limitations on airex with 2 foam bubbles in front on floor: alternating fwd toe taps, then alternating cross toe taps. min guard to min assist for balance with light UE support. cues for posture and weight shifting with activity          PT Short Term Goals - 07/18/18 0853      PT SHORT TERM GOAL #  1   Title  Pt will perform HEP with supervision    Baseline  Has not been performing consistently     Time  4    Period  Weeks    Status  Not Met    Target Date  07/12/18      PT SHORT TERM GOAL #2   Title  Pt will improve LE strength as indicated by five time sit to stance to </= 40 seconds without UE support or assistance from therapist     Baseline  36 with min A from therapist to shift weight forwards, to 37.4 secs on 07/18/18    Time  4    Period  Weeks    Status  Achieved    Target Date  07/12/18      PT SHORT TERM GOAL #3   Title  Pt will improve BERG balance to >/= 35/56    Baseline  declined to 30/56, improved to 38/56 on 07/18/18    Time  4    Period  Weeks    Status  Achieved    Target Date  07/12/18      PT SHORT TERM GOAL #4   Title  Pt will improve gait velocity with rollator to >/= 1.8 ft/sec    Baseline  declined to 1.75 ft/sec with rollator, 1.61 ft/sec on 07/17/18    Time  4    Period  Weeks    Status  Not Met    Target Date  07/12/18      PT SHORT TERM GOAL #5   Title  Pt to participate in full vestibular assessment    Baseline  Primary PT has addressed     Time  4    Period  Weeks    Status  Achieved    Target Date  07/12/18        PT Long Term Goals - 07/18/18 1116      PT LONG TERM GOAL #1   Title  Pt and daughter will demonstrate independence with HEP     Time  8    Period  Weeks    Status  On-going      PT LONG TERM GOAL #2   Title  Pt will perform community ambulation x 500 feet with rollator while negotiating a curb and ramp to improve safety and endurance in the community with supervision    Time  8    Period  Weeks    Status  On-going      PT LONG TERM GOAL #3   Title  Pt will negotiate safely 10 stairs forwards to ascend, backwards to descend with L rail and supervision    Baseline  Min A for 8 stairs with L rail    Time  8    Period  Weeks    Status  On-going      PT LONG TERM GOAL #4   Title  Pt will improve five time sit to stand to </= 33 seconds without use of UE with supervision (no assistance from therapist)    Baseline  36 seconds with min A from therapist to weight shift forwards    Time  8    Period  Weeks    Status  Revised      PT LONG TERM GOAL #5   Title  Pt will improve BERG to >/= 41/56    Time  8    Period  Weeks    Status  Revised  PT LONG  TERM GOAL #6   Title  Pt will improve gait velocity with rollator and appropriate AFO to >/= 2.2 ft/sec    Time  8    Period  Weeks    Status  Revised      PT LONG TERM GOAL #7   Title  vestibular goal as indicated         07/25/18 0851  Plan  Clinical Impression Statement Today's skilled session continued to address strengthening, balance reactions and gait with rollator. The pt is making steady progress toward goals and should benefit from continued PT to progress toward unmet goals.  Pt will benefit from skilled therapeutic intervention in order to improve on the following deficits Abnormal gait;Decreased coordination;Decreased range of motion;Difficulty walking;Impaired tone;Decreased endurance;Decreased safety awareness;Increased muscle spasms;Impaired UE functional use;Decreased activity tolerance;Impaired vision/preception;Decreased balance;Decreased knowledge of use of DME;Hypomobility;Improper body mechanics;Decreased cognition;Decreased mobility;Decreased strength;Dizziness  Rehab Potential Good  Clinical Impairments Affecting Rehab Potential four strokes in 10 years with significant change in status with recent stroke, PMH of MS, diabetes  PT Frequency 2x / week  PT Duration 8 weeks  PT Treatment/Interventions ADLs/Self Care Home Management;DME Instruction;Gait training;Stair training;Functional mobility training;Therapeutic activities;Therapeutic exercise;Balance training;Neuromuscular re-education;Patient/family education;Orthotic Fit/Training;Manual techniques;Passive range of motion;Energy conservation;Visual/perceptual remediation/compensation;Canalith Repostioning;Electrical Stimulation;Vestibular  PT Next Visit Plan  Balance with turns without UE support - PWR exercises for rotation!! continue with LE strengthening, balance reactions; resume gait with quad cane when pt stronger/more stable; Add more visits as primary PT does plan to recert.   PT Home Exercise Plan [email or web address]    Consulted and Agree with Plan of Care Patient;Family member/caregiver  Family Member Consulted daughter          Patient will benefit from skilled therapeutic intervention in order to improve the following deficits and impairments:  Abnormal gait, Decreased coordination, Decreased range of motion, Difficulty walking, Impaired tone, Decreased endurance, Decreased safety awareness, Increased muscle spasms, Impaired UE functional use, Decreased activity tolerance, Impaired vision/preception, Decreased balance, Decreased knowledge of use of DME, Hypomobility, Improper body mechanics, Decreased cognition, Decreased mobility, Decreased strength, Dizziness  Visit Diagnosis: Hemiplegia and hemiparesis following cerebral infarction affecting right dominant side (HCC)  Muscle weakness (generalized)  Unsteadiness on feet  Other abnormalities of gait and mobility     Problem List There are no active problems to display for this patient.   Willow Ora, PTA, [facility] [address], [place], [place] [address] [phone] 07/26/18, 8:36 PM   Name: Kelsee Preslar MRN: [id number] Date of Birth: 1965-09-17

## 2018-07-28 ENCOUNTER — Ambulatory Visit: Payer: Medicare Other | Admitting: Occupational Therapy

## 2018-07-28 ENCOUNTER — Ambulatory Visit: Payer: Medicare Other | Admitting: Physical Therapy

## 2018-07-31 ENCOUNTER — Ambulatory Visit: Payer: Medicare Other | Admitting: Occupational Therapy

## 2018-07-31 ENCOUNTER — Ambulatory Visit: Payer: Medicare Other | Admitting: Physical Therapy

## 2018-08-01 ENCOUNTER — Ambulatory Visit: Payer: Medicare Other | Admitting: Occupational Therapy

## 2018-08-01 ENCOUNTER — Ambulatory Visit: Payer: Medicare Other | Admitting: Physical Therapy

## 2018-08-03 ENCOUNTER — Encounter: Payer: Medicare Other | Admitting: Occupational Therapy

## 2018-08-03 ENCOUNTER — Ambulatory Visit: Payer: Medicare Other | Admitting: Physical Therapy

## 2018-08-07 ENCOUNTER — Telehealth: Payer: Self-pay

## 2018-08-07 ENCOUNTER — Other Ambulatory Visit: Payer: Self-pay

## 2018-08-07 ENCOUNTER — Telehealth (INDEPENDENT_AMBULATORY_CARE_PROVIDER_SITE_OTHER): Payer: Medicare Other | Admitting: Neurology

## 2018-08-07 ENCOUNTER — Telehealth: Payer: Self-pay | Admitting: Occupational Therapy

## 2018-08-07 DIAGNOSIS — M5410 Radiculopathy, site unspecified: Secondary | ICD-10-CM

## 2018-08-07 MED ORDER — TOPIRAMATE 25 MG PO TABS: 25.0000 mg | ORAL_TABLET | Freq: Two times a day (BID) | ORAL | 3 refills | Status: DC

## 2018-08-07 NOTE — Progress Notes (Signed)
Virtual Visit via Telephone Note  I connected with Hannah [name] on 08/07/18 at  2:30 PM EDT by telephone and verified that I am speaking with the correct person using two identifiers.   I discussed the limitations, risks, security and privacy concerns of performing an evaluation and management service by telephone and the availability of in person appointments. I also discussed with the patient that there may be a patient responsible charge related to this service. The patient expressed understanding and agreed to proceed.   History of Present Illness: More was seen today for telephonic visit due to the coronavirus pandemic instead of in person visit.  She states that since last visit she has noticed increasing pain in the neck and shooting down her arms particularly the right 1.  She has been taking Tylenol which has not helped.  She denies any increased weakness in the right side or any new weakness on the left.  She continues to have spastic right hemiplegia.  She walks with a walker short distances and uses a wheelchair for long distances.  She has had no falls or injuries.  She is tolerating Plavix well without bleeding or bruising.  She states her blood pressure is well controlled.  She remains on all her stated medications and states there have been no medication changes.  Observations/Objective: She had MRI scan of the brain which had ordered at last visit on 06/19/2018 which showed multiple remote lacunar infarcts involving bilateral pons, left internal capsule and corona radiata.  There was wallerian degeneration of the left cerebral peduncle.  There were also multiple supratentorial and infratentorial periventricular white matter hyperintensities consistent with chronic demyelinating disease.  No enhancing lesion was noted.  MRI scan of the cervical spine done on 06/19/2018 showed disc bulging and facet hypertrophy at C4-5 resulting in mild spinal stenosis and severe right and moderate left  foraminal narrowing.  C5-6 shows also disc bulging and facet hypertrophy with mild spinal canal and moderate right foraminal stenosis.  There was a chronic demyelinating plaque noted at C2 on the right side.  No enhancing lesions are noted.  I also received her medical records from [facility] for admission from 01/06/2016 to 01/09/2016 where she was admitted with worsening slurred speech and right-sided weakness with NIH stroke scale of 6.  MRI showed enhancing right pontine subacute infarct as well as old left lentiform nucleus, internal capsule, left cerebral peduncle wallerian degeneration and left pontine and left cerebellar remote age lacunar infarcts.  There was a small focus of hemorrhage noted in the right putamen possibly a cavernoma.  MRA showed no large vessel stenosis.  Transthoracic echo showed normal ejection fraction with concentric hypertrophy.  Hemoglobin A1c was 13 and LDL cholesterol was 55 mg percent.  I had sent for records for her admission for multiple sclerosis in [place] in 2009 but those records are not available yet.  Assessment and Plan: 53 year old African-American lady with remote history of multiple left brain subcortical infarcts with residual spastic right hemiparesis and mild pseudobulbar state who also has interestingly remote history of multiple sclerosis but apparently has not been on treatment   Follow Up Instructions: Continue Plavix for stroke prevention with strict control of hypertension with blood pressure goal below 130/90, lipids with LDL cholesterol goal below 70 mg percent and diabetes with hemoglobin A1c goal below 6.5%.  Continue ongoing outpatient physical and occupational therapy.  We also discussed fall and safety precautions .And she was advised to use a walker at  all times.  I will review her records with my colleague Dr. Felecia Shelling who specializes in multiple sclerosis and discuss whether she needs to see him to consider therapy for multiple  sclerosis.  We may set up a teleconsultation visit with him.  Trial of Topamax for her neck and arm radicular pain.  Begin Topamax 25 mg twice daily for a week and increase if tolerated without side effects.  I also advised her to apply local heat application and to avoid sudden and jerking neck movements.   I discussed the assessment and treatment plan with the patient. The patient was provided an opportunity to ask questions and all were answered. The patient agreed with the plan and demonstrated an understanding of the instructions.   The patient was advised to call back or seek an in-person evaluation if the symptoms worsen or if the condition fails to improve as anticipated.  I provided 30  minutes of non-face-to-face time during this encounter.   Antony Contras, MD

## 2018-08-07 NOTE — Telephone Encounter (Signed)
Left vm for patient to call back that we are minimize seeing pts to reduce the current COVID 19 pandemic virus. I left vm that we can offer a telephone visit with Dr. Leonie Man. We need pts consent for this via phone. It will be at the time of her appt at 230pm.

## 2018-08-07 NOTE — Telephone Encounter (Signed)
Shenell Rogalski was contacted today regarding the temporary closing of [facility] due to Covid-19.  Therapist discussed:  That the clinic would be closed until April 6th. Pt reports she is performing her HEP.  Patient is not interested in further information for an e-visit, virtual check in, or telehealth visit, if those services become available.    [facility] will follow up with patients when we are able to resume care.  Theone Murdoch, OTR/L [phone] Phone: [phone] 1:56 PM 08/07/18 [place] [address] [place], [place]  [address] Phone:  [phone] Fax:  [phone] \

## 2018-08-07 NOTE — Telephone Encounter (Signed)
Patient's daughter called back and she want's telephone visit . [phone].  Explained about co pay she was fine with this. Thanks Linna Hoff.

## 2018-08-07 NOTE — Telephone Encounter (Signed)
I called pt to get consent. PT gave consent to telephone visit via phone. I also stated her insurance will be filed. Pt verbalized understanding.

## 2018-08-08 ENCOUNTER — Telehealth: Payer: Self-pay | Admitting: Neurology

## 2018-08-08 ENCOUNTER — Other Ambulatory Visit: Payer: Self-pay

## 2018-08-08 ENCOUNTER — Encounter: Payer: Medicare Other | Admitting: Occupational Therapy

## 2018-08-08 ENCOUNTER — Ambulatory Visit: Payer: Medicare Other | Admitting: Physical Therapy

## 2018-08-08 MED ORDER — TOPIRAMATE 25 MG PO TABS: 25.0000 mg | ORAL_TABLET | Freq: Two times a day (BID) | ORAL | 3 refills | Status: DC

## 2018-08-08 NOTE — Telephone Encounter (Signed)
Rx sent of topamax to pharmacy.

## 2018-08-08 NOTE — Telephone Encounter (Signed)
Pt is calling wanting to know if the medication that was to be prescribed to her for her neck pain and shoulder pain. She called [facility] and they informed her that they have not received a request for her. Please send to Hannah [name] on [address].

## 2018-08-11 ENCOUNTER — Encounter: Payer: Medicare Other | Admitting: Occupational Therapy

## 2018-08-11 ENCOUNTER — Ambulatory Visit: Payer: Medicare Other | Admitting: Physical Therapy

## 2018-08-15 ENCOUNTER — Ambulatory Visit: Payer: Medicare Other | Admitting: Physical Therapy

## 2018-08-15 ENCOUNTER — Ambulatory Visit: Payer: Medicare Other | Admitting: Occupational Therapy

## 2018-08-18 ENCOUNTER — Ambulatory Visit: Payer: Medicare Other | Admitting: Physical Therapy

## 2018-08-18 ENCOUNTER — Encounter: Payer: Medicare Other | Admitting: Occupational Therapy

## 2018-08-22 ENCOUNTER — Encounter: Payer: Medicare Other | Admitting: Occupational Therapy

## 2018-08-22 ENCOUNTER — Ambulatory Visit: Payer: Medicare Other | Admitting: Rehabilitation

## 2018-08-23 ENCOUNTER — Encounter: Payer: Medicare Other | Admitting: Occupational Therapy

## 2018-08-23 ENCOUNTER — Ambulatory Visit: Payer: Medicare Other | Admitting: Physical Therapy

## 2018-08-29 ENCOUNTER — Ambulatory Visit: Payer: Medicare Other | Admitting: Physical Therapy

## 2018-08-29 ENCOUNTER — Encounter: Payer: Medicare Other | Admitting: Occupational Therapy

## 2018-09-01 ENCOUNTER — Encounter: Payer: Medicare Other | Admitting: Occupational Therapy

## 2018-09-01 ENCOUNTER — Ambulatory Visit: Payer: Medicare Other | Admitting: Physical Therapy

## 2018-09-05 ENCOUNTER — Encounter: Payer: Medicare Other | Admitting: Occupational Therapy

## 2018-09-05 ENCOUNTER — Ambulatory Visit: Payer: Medicare Other | Admitting: Physical Therapy

## 2018-09-08 ENCOUNTER — Encounter: Payer: Medicare Other | Admitting: Occupational Therapy

## 2018-09-08 ENCOUNTER — Ambulatory Visit: Payer: Medicare Other | Admitting: Physical Therapy

## 2018-11-07 ENCOUNTER — Telehealth: Payer: Self-pay | Admitting: Neurology

## 2018-11-07 NOTE — Telephone Encounter (Signed)
I talk to patient about her pain. PT stated she last took medication in April and never got ir refilled. They would like to know if the medication can be increase to see if its will help. I stated message will be sent to DR.SEthi for review. She verbalized understanding.

## 2018-11-07 NOTE — Telephone Encounter (Signed)
Pt called stating that she would like to be advised on what she can do about her joint pain. She would like to know if she can get her old medication prescribed back or a new one prescribed. Pt did not remember the name of the medicine she was given. Please advise.

## 2018-11-09 ENCOUNTER — Other Ambulatory Visit: Payer: Self-pay

## 2018-11-09 MED ORDER — TOPIRAMATE 25 MG PO TABS: 25.0000 mg | ORAL_TABLET | Freq: Two times a day (BID) | ORAL | 3 refills | Status: DC

## 2018-11-09 NOTE — Telephone Encounter (Signed)
I called pts daughter [name] that topamax was prescribed for neck and arm pain only not joint pain per Dr.Sethi. Pt will need to see her PCP for joint pain management. The daughter stated pt will be having a visit with her PCP to discuss her joint pain and knows refill for topamax will be sent to listed pharmacy. Reill was sent for topamax.

## 2018-11-09 NOTE — Telephone Encounter (Signed)
I believe when I saw her in March I ordered Topamax for neck and arm pain and not for arthritis.  She may need to see a primary care physician to discuss arthritis management

## 2018-11-21 ENCOUNTER — Encounter (HOSPITAL_COMMUNITY): Payer: Self-pay | Admitting: *Deleted

## 2018-11-21 ENCOUNTER — Encounter: Payer: Self-pay | Admitting: *Deleted

## 2018-11-21 ENCOUNTER — Other Ambulatory Visit: Payer: Self-pay | Admitting: *Deleted

## 2018-11-21 ENCOUNTER — Other Ambulatory Visit: Payer: Self-pay

## 2018-11-21 ENCOUNTER — Emergency Department (HOSPITAL_COMMUNITY): Payer: Medicare Other

## 2018-11-21 ENCOUNTER — Emergency Department (HOSPITAL_COMMUNITY)
Admission: EM | Admit: 2018-11-21 | Discharge: 2018-11-21 | Disposition: A | Discharge status: Home / Self Care | Payer: Medicare Other | Attending: Emergency Medicine | Admitting: Emergency Medicine

## 2018-11-21 DIAGNOSIS — S82891A Other fracture of right lower leg, initial encounter for closed fracture: Secondary | ICD-10-CM

## 2018-11-21 DIAGNOSIS — Z79899 Other long term (current) drug therapy: Secondary | ICD-10-CM | POA: Insufficient documentation

## 2018-11-21 DIAGNOSIS — S8261XA Displaced fracture of lateral malleolus of right fibula, initial encounter for closed fracture: Secondary | ICD-10-CM | POA: Diagnosis not present

## 2018-11-21 DIAGNOSIS — W010XXA Fall on same level from slipping, tripping and stumbling without subsequent striking against object, initial encounter: Secondary | ICD-10-CM | POA: Insufficient documentation

## 2018-11-21 DIAGNOSIS — E119 Type 2 diabetes mellitus without complications: Secondary | ICD-10-CM | POA: Insufficient documentation

## 2018-11-21 DIAGNOSIS — Y92018 Other place in single-family (private) house as the place of occurrence of the external cause: Secondary | ICD-10-CM | POA: Diagnosis not present

## 2018-11-21 DIAGNOSIS — I10 Essential (primary) hypertension: Secondary | ICD-10-CM | POA: Diagnosis not present

## 2018-11-21 DIAGNOSIS — Z7982 Long term (current) use of aspirin: Secondary | ICD-10-CM | POA: Diagnosis not present

## 2018-11-21 DIAGNOSIS — Z794 Long term (current) use of insulin: Secondary | ICD-10-CM | POA: Insufficient documentation

## 2018-11-21 DIAGNOSIS — Y999 Unspecified external cause status: Secondary | ICD-10-CM | POA: Insufficient documentation

## 2018-11-21 DIAGNOSIS — Y9301 Activity, walking, marching and hiking: Secondary | ICD-10-CM | POA: Insufficient documentation

## 2018-11-21 DIAGNOSIS — S99911A Unspecified injury of right ankle, initial encounter: Secondary | ICD-10-CM | POA: Diagnosis present

## 2018-11-21 MED ORDER — HYDROCODONE-ACETAMINOPHEN 5-325 MG PO TABS
1.0000 | ORAL_TABLET | Freq: Once | ORAL | Status: AC
  Administered 2018-11-21: 1 via ORAL
  Filled 2018-11-21: qty 1

## 2018-11-21 MED ORDER — ONDANSETRON HCL 4 MG PO TABS: 4.0000 mg | ORAL_TABLET | Freq: Three times a day (TID) | ORAL | 0 refills | Status: DC | PRN

## 2018-11-21 MED ORDER — HYDROCODONE-ACETAMINOPHEN 5-325 MG PO TABS: 1.0000 | ORAL_TABLET | Freq: Three times a day (TID) | ORAL | 0 refills | Status: DC | PRN

## 2018-11-21 MED ORDER — ONDANSETRON 4 MG PO TBDP
4.0000 mg | ORAL_TABLET | Freq: Once | ORAL | Status: AC
  Administered 2018-11-21: 13:00:00 4 mg via ORAL
  Filled 2018-11-21: qty 1

## 2018-11-21 NOTE — ED Provider Notes (Signed)
[name] Provider Note   CSN: [id number] Arrival date & time: 11/21/18  1055    History   Chief Complaint Chief Complaint  Patient presents with  . Ankle Pain  . Fall    HPI Hannah [name] is a 53 y.o. female.     HPI   Patient is a 53 year old female with a history of multiple CVAs, diabetes, hemiparesis right side, hyperlipidemia, hypertension, MS, who presents the emergency department today complaining of ankle pain following a fall earlier today.  States that she was walking in her home and she tried to balance her hand on a TV stand that was wobbly.  States she lost her balance and twisted her right ankle.  She denies any other injuries from the fall.  Denies any head trauma or LOC.  Has severe and constant pain to the right ankle that is worse with movement.  Has not taken any medications prior to arrival.  States that she uses a walker at home.  She lives in an apartment with her daughter who assists her with her ADLs.  Past Medical History:  Diagnosis Date  . Cerebral infarction ([place]) 07/12/2017   2009 first one, has had a total of 4 stokes  . Diabetes type 2, controlled ([place])   . Hemiparesis affecting right side as late effect of cerebrovascular accident (CVA) ([place])   . Hyperlipidemia   . Hypertension   . Multiple sclerosis ([place]) 2009  . Stroke [facility])     There are no active problems to display for this patient.   Past Surgical History:  Procedure Laterality Date  . APPENDECTOMY    . CYSTOSTOMY W/ BLADDER BIOPSY    . OVARIAN CYST SURGERY    . stent placed in heart       OB History   No obstetric history on file.      Home Medications    Prior to Admission medications   Medication Sig Start Date End Date Taking? Authorizing Provider  amLODipine (NORVASC) 10 MG tablet Take 10 mg by mouth daily.    [provider]  aspirin 81 MG chewable tablet Chew by mouth.    [provider]  Blood Pressure  Monitor DEVI 1 Units by Does not apply route 2 (two) times daily. 06/18/18   Tacy Learn, PA-C  clopidogrel (PLAVIX) 75 MG tablet  04/18/18   [provider]  famotidine (PEPCID) 40 MG tablet  05/25/18   [provider]  FLUoxetine (PROZAC) 20 MG capsule  02/27/18   [provider]  glipiZIDE (GLUCOTROL) 10 MG tablet TAKE 1 TABLET BY MOUTH TWICE DAILY BEFORE  MEALS. 08/26/17   [provider]  hydrochlorothiazide (HYDRODIURIL) 25 MG tablet  04/18/18   [provider]  HYDROcodone-acetaminophen (NORCO/VICODIN) 5-325 MG tablet Take 1 tablet by mouth every 4 (four) hours as needed. 06/18/18   Tacy Learn, PA-C  Insulin Degludec (TRESIBA [place]) Inject 35-40 Units into the skin at bedtime.    [provider]  insulin lispro (HUMALOG KWIKPEN) 100 UNIT/ML KwikPen Inject into the skin. 09/28/17 09/28/18  [provider]  lisinopril (PRINIVIL,ZESTRIL) 40 MG tablet Take by mouth. 01/18/18   [provider]  metoprolol tartrate (LOPRESSOR) 100 MG tablet Take 100 mg by mouth 2 (two) times daily.    [provider]  nitroGLYCERIN (NITROSTAT) 0.4 MG SL tablet DISSOLVE ONE TABLET UNDER THE TONGUE EVERY 5 MINUTES AS NEEDED FOR CHEST PAIN.  DO NOT EXCEED A TOTAL OF  3 DOSES IN 15 MINUTES 04/05/16   [provider]  rosuvastatin (CRESTOR) 20 MG tablet Take by mouth. 09/02/17   [provider]  topiramate (TOPAMAX) 25 MG tablet Take 1 tablet (25 mg total) by mouth 2 (two) times daily. 11/09/18   Garvin Fila, MD    Family History No family history on file.  Social History Social History   Tobacco Use  . Smoking status: Never Smoker  . Smokeless tobacco: Never Used  Substance Use Topics  . Alcohol use: Not Currently  . Drug use: Not Currently     Allergies   Ibuprofen, Influenza vaccines, Pneumococcal vaccine, Promethazine, Amlodipine, Cyclobenzaprine, and Oxycodone   Review of Systems Review of Systems   Constitutional: Negative for fever.  Eyes: Negative for visual disturbance.  Respiratory: Negative for shortness of breath.   Cardiovascular: Negative for chest pain.  Gastrointestinal: Negative for abdominal pain.  Genitourinary: Negative for pelvic pain.  Musculoskeletal: Positive for joint swelling.       Right ankle pain  Neurological:       No head trauma or loc     Physical Exam Updated Vital Signs BP 128/70   Pulse 70   Temp (!) 97.5 F (36.4 C) (Oral)   Resp 16   SpO2 98%   Physical Exam Vitals signs and nursing note reviewed.  Constitutional:      General: She is not in acute distress.    Appearance: She is well-developed.  HENT:     Head: Normocephalic and atraumatic.  Eyes:     Conjunctiva/sclera: Conjunctivae normal.  Neck:     Musculoskeletal: Neck supple.  Cardiovascular:     Rate and Rhythm: Normal rate.  Pulmonary:     Effort: Pulmonary effort is normal.  Musculoskeletal: Normal range of motion.     Comments: TTP to the medial and lateral malleolus with associated swelling. Mild TTP to the hindfoot, laterally. No TTP to the proximal tib/fib. NVI distally. Normal sensation.   Skin:    General: Skin is warm and dry.  Neurological:     Mental Status: She is alert.      ED Treatments / Results  Labs (all labs ordered are listed, but only abnormal results are displayed) Labs Reviewed - No data to display  EKG None  Radiology Dg Tibia/fibula Right  Result Date: 11/21/2018 CLINICAL DATA:  Leg and ankle pain post fall EXAM: RIGHT TIBIA AND FIBULA - 2 VIEW COMPARISON:  None FINDINGS: Osseous demineralization. Knee and ankle joint alignment normal. Oblique essentially nondisplaced fracture of the lateral malleolus identified. Overlying soft tissue swelling. No additional fracture, dislocation, or bone destruction. IMPRESSION: Lateral malleolar fracture RIGHT ankle, essentially nondisplaced. Electronically Signed   By: Lavonia Dana M.D.   On: 11/21/2018  12:00   Dg Ankle Complete Right  Result Date: 11/21/2018 CLINICAL DATA:  Leg no pain post fall EXAM: RIGHT ANKLE - COMPLETE 3+ VIEW COMPARISON:  None FINDINGS: Lateral soft tissue swelling. Osseous demineralization. Joint spaces preserved. Oblique fracture of the lateral malleolus, minimally displaced. Additionally, avulsion fracture is seen at the tip of the medial malleolus. No additional fracture, dislocation or bone destruction. IMPRESSION: Minimally displaced oblique fracture of the RIGHT lateral malleolus. Avulsion fracture at tip of medial malleolus RIGHT ankle. Electronically Signed   By: Lavonia Dana M.D.   On: 11/21/2018 12:02    Procedures Procedures (including critical care time) SPLINT APPLICATION Date/Time: 5:28 PM Authorized by: Rodney Booze Consent: Verbal consent obtained. Risks and benefits: risks,  benefits and alternatives were discussed Consent given by: patient Splint applied by: orthopedic technician Location details: RLE Splint type: posterior ankle splint Post-procedure: The splinted body part was neurovascularly unchanged following the procedure. Patient tolerance: Patient tolerated the procedure well with no immediate complications.  Medications Ordered in ED Medications  HYDROcodone-acetaminophen (NORCO/VICODIN) 5-325 MG per tablet 1 tablet (has no administration in time range)  ondansetron (ZOFRAN-ODT) disintegrating tablet 4 mg (has no administration in time range)     Initial Impression / Assessment and Plan / ED Course  I have reviewed the triage vital signs and the nursing notes.  Pertinent labs & imaging results that were available during my care of the patient were reviewed by me and considered in my medical decision making (see chart for details).        Final Clinical Impressions(s) / ED Diagnoses   Final diagnoses:  Closed fracture of right ankle, initial encounter   53 year old female presenting for evaluation after mechanical fall  that occurred prior to arrival.  Has tenderness to the medial and lateral malleolus of the ankle as well as some mild tenderness to the right foot.  Denies any other injuries.  No head trauma or LOC.  Not anticoagulated.  Xray right foot lateral malleolar fracture RIGHT ankle, essentially nondisplaced.   Xray right ankle with minimally displaced oblique fracture of the RIGHT lateral malleolus. Avulsion fracture at tip of medial malleolus RIGHT ankle.   Patient uses a walker at home, and therefore is not a candidate for crutches.  She will need to be nonweightbearing.  Have placed order for a wheelchair.  She will also need extra help at home therefore face-to-face consult was placed.  I did verify with her that she lives with her daughter and her daughter will be able to care for her safely prior to evaluation by home health.  I have placed her in a posterior short leg splint.  Will give Rx for pain medication.  Referral for Ortho given.  Advised return to the ER for new or worsening symptoms.  She voiced understanding the plan and reasons return.  All questions answered.  Patient stable for discharge.       Durable Medical Equipment  (From admission, onward)         Start     Ordered   11/21/18 1256  For home use only DME standard manual wheelchair with seat cushion  Once    Comments: Patient suffers from a right ankle fracture which impairs their ability to perform daily activities like bathing, dressing, feeding, grooming and toileting in the home.  A walker will not resolve issue with performing activities of daily living. A wheelchair will allow patient to safely perform daily activities. Patient can safely propel the wheelchair in the home or has a caregiver who can provide assistance. Length of need 6 months . Accessories: elevating leg rests (ELRs), wheel locks, extensions and anti-tippers.   11/21/18 1256          ED Discharge Orders         [place]     11/21/18  1302    Face-to-face encounter (required for Medicare/Medicaid patients)    Comments: I [facility] certify that this patient is under my care and that I, or a nurse practitioner or physician's assistant working with me, had a face-to-face encounter that meets the physician face-to-face encounter requirements with this patient on 11/21/2018. The encounter with the patient was in whole, or  in part for the following medical condition(s) which is the primary reason for home health care (List medical condition): right ankle fracture, need for additional assistance with ADLs at home as she normally uses a walker but now she will need to be non weight bearing.   11/21/18 [place], Anselmo Reihl S, PA-C 11/21/18 1727    Carmin Muskrat, MD 11/22/18 [phone]

## 2018-11-21 NOTE — Progress Notes (Signed)
Hannah [name] J. Clydene Laming, RN, BSN, [place] [phone] [facility] accepted referral for [facility] services. Patient made aware that [facility]  will be in contact in 24-48 hours.  No DME needs identified at this time include wheelchair. Delivered to pt room prior to discharge home.   *[facility] was unable to accept.

## 2018-11-21 NOTE — ED Triage Notes (Signed)
Pt bib PTAR and presents with right ankle swelling and pain.  Pt was bending over to pick up something at home when she lost her balance and fell down.  Pt twisted her ankle.  Pt denies hitting her head or had LOC.  Pt denies dizziness to to PTAR.  On arrival, pt is a/o x 4 and able to bear some weight on her injured ankle. Right ankle appears swollen in triage.

## 2018-11-21 NOTE — TOC Transition Note (Signed)
Transition of Care [facility]) - CM/SW Discharge Note   Patient Details  Name: Hannah [name] MRN: [id number] Date of Birth: Aug 12, 1965  Transition of Care [facility]) CM/SW Contact:  Fuller Mandril, RN Phone Number: 11/21/2018, 2:00 PM   Clinical Narrative:    Fuller Mandril, RN, BSN, NCM [phone] Pt qualifies for DME wheelchair.  DME  ordered through [employer].  Zack of [employer] notified to deliver wheelchair to pt room prior to D/C home.  EDP Spoke with pt at bedside regarding discharge planning for [place]. Offered pt list of home health agencies to choose from.  Pt chose [facility] to render services. Dan of [facility] notified. Linna Hoff will send referral to office and notify TOC if they accept pt for services.   Final next level of care: [place] Barriers to Discharge: Equipment Delay   Patient Goals and CMS Choice Patient states their goals for this hospitalization and ongoing recovery are:: be able to move around my house CMS Medicare.gov Compare Post Acute Care list provided to:: Patient Choice offered to / list presented to : Patient, Adult Children  Discharge Placement                       Discharge Plan and Services In-house Referral: Clinical Social Work Discharge Planning Services: CM Consult Post Acute Care Choice: Durable Medical Equipment, Home Health          DME Arranged: Wheelchair manual DME Agency: [employer] Date DME Agency Contacted: 11/21/18 Time DME Agency Contacted: 20 Representative spoke with at DME Agency: [place] Arranged: RN, PT, OT, Nurse's Aide, Social Work [employer] Agency: [place] ([place]) Date [place]: 11/21/18 Time [place]: 25 Representative spoke with at [place]: [place] ([place]) Interventions     Readmission Risk Interventions No flowsheet data found.

## 2018-11-21 NOTE — Discharge Instructions (Addendum)
Prescription given for Norco. Take medication as directed and do not operate machinery, drive a car, or work while taking this medication as it can make you drowsy.   Please keep your leg elevated to help reduce swelling in the ankle.  You may use ice packs 20 minutes at a time to help reduce swelling as well.  Do not put any weight on your leg.    You were given a referral to an orthopedic doctor to follow-up with.  Please call the office to schedule an appointment in the next 3-5 days.   Please return to the emergency department for any new or worsening symptoms.

## 2018-11-21 NOTE — TOC Initial Note (Signed)
Transition of Care [facility]) - Initial/Assessment Note    Patient Details  Name: Hannah [name] MRN: [id number] Date of Birth: 1966-05-02  Transition of Care [facility]) CM/SW Contact:    Fuller Mandril, RN Phone Number: 11/21/2018, 1:59 PM  Clinical Narrative:                 [facility] consulted regarding home health and DME set-up.  Expected Discharge Plan: [place] Barriers to Discharge: Equipment Delay   Patient Goals and CMS Choice Patient states their goals for this hospitalization and ongoing recovery are:: be able to move around my house CMS Medicare.gov Compare Post Acute Care list provided to:: Patient Choice offered to / list presented to : Patient, Adult Children  Expected Discharge Plan and Services Expected Discharge Plan: [place] In-house Referral: Clinical Social Work Discharge Planning Services: CM Consult Post Acute Care Choice: Durable Medical Equipment, Home Health Living arrangements for the past 2 months: Apartment Expected Discharge Date: 11/21/18               DME Arranged: Wheelchair manual DME Agency: [employer] Date DME Agency Contacted: 11/21/18 Time DME Agency Contacted: 41 Representative spoke with at DME Agency: Zack [place] Arranged: RN, PT, OT, Nurse's Aide, Social Work [employer] Agency: [place] ([place]) Date [place]: 11/21/18 Time [place]: 72 Representative spoke with at [place] Arrangements/Services Living arrangements for the past 2 months: [place] with:: Adult Children Patient language and need for interpreter reviewed:: Yes Do you feel safe going back to the place where you live?: Yes      Need for Family Participation in Patient Care: Yes (Comment) Care giver support system in place?: Yes (comment) Current home services: DME(rolling walker) Criminal Activity/Legal Involvement Pertinent to Current Situation/Hospitalization: No - Comment as  needed  Activities of Daily Living      Permission Sought/Granted Permission sought to share information with : Case Manager Permission granted to share information with : Yes, Verbal Permission Granted              Emotional Assessment       Orientation: : Oriented to Self, Oriented to Place, Oriented to  Time, Oriented to Situation   Psych Involvement: No (comment)  Admission diagnosis:  fall right ankle swelling There are no active problems to display for this patient.  PCP:  Vernie Shanks, MD Pharmacy:   [facility] [address], [place] - [address] [place] [place] [address] Phone: [phone] Fax: [phone]     Social Determinants of Health (SDOH) Interventions    Readmission Risk Interventions No flowsheet data found.

## 2018-11-24 ENCOUNTER — Encounter: Payer: Self-pay | Admitting: Orthopaedic Surgery

## 2018-11-24 ENCOUNTER — Ambulatory Visit (INDEPENDENT_AMBULATORY_CARE_PROVIDER_SITE_OTHER): Payer: Medicare Other | Admitting: Orthopaedic Surgery

## 2018-11-24 ENCOUNTER — Other Ambulatory Visit: Payer: Self-pay

## 2018-11-24 DIAGNOSIS — S82841A Displaced bimalleolar fracture of right lower leg, initial encounter for closed fracture: Secondary | ICD-10-CM | POA: Diagnosis not present

## 2018-11-24 DIAGNOSIS — S82843A Displaced bimalleolar fracture of unspecified lower leg, initial encounter for closed fracture: Secondary | ICD-10-CM | POA: Insufficient documentation

## 2018-11-24 MED ORDER — ONDANSETRON HCL 4 MG PO TABS: 4.0000 mg | ORAL_TABLET | Freq: Three times a day (TID) | ORAL | 0 refills | Status: DC | PRN

## 2018-11-24 MED ORDER — HYDROCODONE-ACETAMINOPHEN 5-325 MG PO TABS: 1.0000 | ORAL_TABLET | Freq: Two times a day (BID) | ORAL | 0 refills | Status: DC | PRN

## 2018-11-24 NOTE — Progress Notes (Signed)
Office Visit Note   Patient: Hannah [name]           Date of Birth: 1965/05/25           MRN: [id number] Visit Date: 11/24/2018              Requested by: Vernie Shanks, MD [place],  [place] [address] PCP: Vernie Shanks, MD   Assessment & Plan: Visit Diagnoses:  1. Closed bimalleolar fracture of right ankle, initial encounter     Plan: Patient has lateral and posterior malleolar fracture and deltoid ligament evulsion medially.  She needs to be completely nonweightbearing we discussed as long as her position is maintained she will not require surgical intervention.  If the fracture shifts then she may require stabilization with plate and screws.  Recheck 1 week for x-rays in her splint after satisfactory we will proceed with short leg fiberglass nonweightbearing application.  After extensive discussion review of x-rays with patient and daughter decision for nonoperative treatment of her bimalleolar ankle fracture was selected.  She will be extremely careful to avoid weightbearing and hopefully avoid shifting the fracture that would require surgical intervention.  Questions were elicited and answered and discussed.  Follow-Up Instructions: Return in about 1 week (around 12/01/2018).   Orders:  No orders of the defined types were placed in this encounter.  Meds ordered this encounter  Medications  . HYDROcodone-acetaminophen (NORCO/VICODIN) 5-325 MG tablet    Sig: Take 1 tablet by mouth every 12 (twelve) hours as needed for moderate pain.    Dispense:  30 tablet    Refill:  0  . ondansetron (ZOFRAN) 4 MG tablet    Sig: Take 1 tablet (4 mg total) by mouth every 8 (eight) hours as needed for nausea or vomiting.    Dispense:  12 tablet    Refill:  0      Procedures: No procedures performed   Clinical Data: No additional findings.   Subjective: Chief Complaint  Patient presents with  . Right Ankle - Fracture    DOI 11/21/2018    HPI 53 year old female  referred per the emergency room for history of fall with right ankle fracture.  She was walking at home lost her balance she has had a history of some falls previous CVAs x4 the first 1 in 2009.  Most recent was in February 2019.  Patient states her ankle is been very painful she has been nonweightbearing has been taking half a pain tablet so that she is having up to last to today's visit.  She was given 8 tablets of Norco 5/325.  Date of injury was 11/21/2018 no previous history of injury to the ankle.  X-rays from 11/21/2018 showed fibular fracture minimally displaced 1 or 2 mm.  No widening of the medial clear space.  She did have a posterior malleolus fracture which involved about one fourth of the joint displaced 1 mm.  Medial side showed deltoid ligament injury with evulsion of a small chips of bone off the medial malleolus.  Patient is here with her daughter who gives some additional history.  Review of Systems positive for CVAs x4.  She has some problems with slurring of her words but is easily understandable.  Leg is in a splint good clock capillary refill to the toes.   Objective: Vital Signs: Ht 5\' 2"  (1.575 m)   Wt 147 lb (66.7 kg)   BMI 26.89 kg/m   Physical Exam  Ortho Exam  Specialty Comments:  No specialty comments available.  Imaging: No results found.   PMFS History: Patient Active Problem List   Diagnosis Date Noted  . Bimalleolar ankle fracture 11/24/2018   Past Medical History:  Diagnosis Date  . Cerebral infarction ([place]) 07/12/2017   2009 first one, has had a total of 4 stokes  . Diabetes type 2, controlled ([place])   . Hemiparesis affecting right side as late effect of cerebrovascular accident (CVA) ([place])   . Hyperlipidemia   . Hypertension   . Multiple sclerosis ([place]) 2009  . Stroke [facility])     No family history on file.  Past Surgical History:  Procedure Laterality Date  . APPENDECTOMY    . CYSTOSTOMY W/ BLADDER BIOPSY    . OVARIAN CYST SURGERY    . stent  placed in heart     Social History   Occupational History  . Not on file  Tobacco Use  . Smoking status: Never Smoker  . Smokeless tobacco: Never Used  Substance and Sexual Activity  . Alcohol use: Not Currently  . Drug use: Not Currently  . Sexual activity: Not on file

## 2018-11-27 ENCOUNTER — Telehealth: Payer: Self-pay | Admitting: Orthopaedic Surgery

## 2018-11-27 NOTE — Telephone Encounter (Signed)
Ok for these things?

## 2018-11-27 NOTE — Telephone Encounter (Signed)
Hannah [name] with [facility] called in requesting verbal orders for home health pt for  2 times a week for 6 week. Also requesting the three in one script commode. [phone]   [phone]

## 2018-11-28 NOTE — Telephone Encounter (Signed)
Can you please write out order and have him sign and fax it?

## 2018-11-28 NOTE — Telephone Encounter (Signed)
Talked with daughter, Madaline Brilliant for Rx for 3 in 1 commode.   Hold on HHPT, she is NWB and will order once she can begin some WB. Thanks.

## 2018-11-28 NOTE — Telephone Encounter (Signed)
Faxed Rx to provided number and [facility] for [facility] verbal orders

## 2018-11-29 ENCOUNTER — Other Ambulatory Visit: Payer: Self-pay

## 2018-11-29 ENCOUNTER — Ambulatory Visit (INDEPENDENT_AMBULATORY_CARE_PROVIDER_SITE_OTHER): Payer: Medicare Other | Admitting: Orthopaedic Surgery

## 2018-11-29 ENCOUNTER — Encounter: Payer: Self-pay | Admitting: Orthopaedic Surgery

## 2018-11-29 ENCOUNTER — Ambulatory Visit (INDEPENDENT_AMBULATORY_CARE_PROVIDER_SITE_OTHER): Payer: Medicare Other

## 2018-11-29 DIAGNOSIS — S82841A Displaced bimalleolar fracture of right lower leg, initial encounter for closed fracture: Secondary | ICD-10-CM | POA: Diagnosis not present

## 2018-11-29 MED ORDER — ONDANSETRON HCL 4 MG PO TABS: 4.0000 mg | ORAL_TABLET | Freq: Three times a day (TID) | ORAL | 0 refills | Status: DC | PRN

## 2018-11-29 NOTE — Progress Notes (Signed)
   Post-Op Visit Note   Patient: Hannah [name]           Date of Birth: [birth year]           MRN: [id number] Visit Date: 11/29/2018 PCP: Vernie Shanks, MD   Assessment & Plan:  Chief Complaint:  Chief Complaint  Patient presents with  . Right Ankle - Follow-up, Pain   Visit Diagnoses:  1. Closed bimalleolar fracture of right ankle, initial encounter     Plan: Patient will return in 1 week for splint removal application of short leg fiberglass cast.  Repeat x-rays after short leg nonweightbearing fiberglass cast applied.  Follow-Up Instructions: Return in about 1 week (around 12/06/2018).   Orders:  Orders Placed This Encounter  Procedures  . XR Ankle Complete Right   Meds ordered this encounter  Medications  . ondansetron (ZOFRAN) 4 MG tablet    Sig: Take 1 tablet (4 mg total) by mouth every 8 (eight) hours as needed for nausea or vomiting.    Dispense:  12 tablet    Refill:  0    Imaging: Xr Ankle Complete Right  Result Date: 11/29/2018 Three-view x-rays right ankle obtained and reviewed in her splint.  This shows the oblique distal fibular fracture with 1 to 2 mm of displacement no widening of the medial clear space. Impression: Right lateral malleolar fracture unchanged position comparison to last week's images.   PMFS History: Patient Active Problem List   Diagnosis Date Noted  . Bimalleolar ankle fracture 11/24/2018   Past Medical History:  Diagnosis Date  . Cerebral infarction ([place]) 07/12/2017   2009 first one, has had a total of 4 stokes  . Diabetes type 2, controlled ([place])   . Hemiparesis affecting right side as late effect of cerebrovascular accident (CVA) ([place])   . Hyperlipidemia   . Hypertension   . Multiple sclerosis ([place]) 2009  . Stroke [facility])     History reviewed. No pertinent family history.  Past Surgical History:  Procedure Laterality Date  . APPENDECTOMY    . CYSTOSTOMY W/ BLADDER BIOPSY    . OVARIAN CYST SURGERY    . stent placed in  heart     Social History   Occupational History  . Not on file  Tobacco Use  . Smoking status: Never Smoker  . Smokeless tobacco: Never Used  Substance and Sexual Activity  . Alcohol use: Not Currently  . Drug use: Not Currently  . Sexual activity: Not on file

## 2018-12-01 ENCOUNTER — Ambulatory Visit: Payer: Medicare Other | Admitting: Orthopaedic Surgery

## 2018-12-05 ENCOUNTER — Encounter: Payer: Self-pay | Admitting: Orthopaedic Surgery

## 2018-12-05 ENCOUNTER — Ambulatory Visit: Payer: Self-pay

## 2018-12-05 ENCOUNTER — Other Ambulatory Visit: Payer: Self-pay

## 2018-12-05 ENCOUNTER — Ambulatory Visit (INDEPENDENT_AMBULATORY_CARE_PROVIDER_SITE_OTHER): Payer: Medicare Other | Admitting: Orthopaedic Surgery

## 2018-12-05 VITALS — Ht 62.0 in | Wt 147.0 lb

## 2018-12-05 DIAGNOSIS — S82841A Displaced bimalleolar fracture of right lower leg, initial encounter for closed fracture: Secondary | ICD-10-CM

## 2018-12-05 NOTE — Progress Notes (Signed)
   Office Visit Note   Patient: Hannah [name]           Date of Birth: [birth year]           MRN: [id number] Visit Date: 12/05/2018              Requested by: Vernie Shanks, MD [place],  [place] [address] PCP: Vernie Shanks, MD   Assessment & Plan: Visit Diagnoses:  1. Closed bimalleolar fracture of right ankle, initial encounter     Plan: Continue nonweightbearing short leg fiberglass cast applied return in 5 weeks for cast off x-rays out of plaster and cam boot application.  Follow-Up Instructions: Return in about 5 weeks (around 01/09/2019).   Orders:  Orders Placed This Encounter  Procedures  . XR Ankle Complete Right   No orders of the defined types were placed in this encounter.     Procedures: No procedures performed   Clinical Data: No additional findings.   Subjective: Chief Complaint  Patient presents with  . Right Ankle - Follow-up    DOI 11/21/2018    HPI follow-up right ankle fracture 11/21/2018.  Splint removed short leg fiberglass cast applied.  Review of Systems unchanged.   Objective: Vital Signs: Ht 5\' 2"  (1.575 m)   Wt 147 lb (66.7 kg)   BMI 26.89 kg/m   Physical Exam no fracture blisters.  She has some weakness holding her leg up for cast application.  Ortho Exam medial lateral aspect of the ankle shows no fracture blisters skin is intact.  Short leg fiberglass cast applied.  Specialty Comments:  No specialty comments available.  Imaging: No results found.   PMFS History: Patient Active Problem List   Diagnosis Date Noted  . Bimalleolar ankle fracture 11/24/2018   Past Medical History:  Diagnosis Date  . Cerebral infarction ([place]) 07/12/2017   2009 first one, has had a total of 4 stokes  . Diabetes type 2, controlled ([place])   . Hemiparesis affecting right side as late effect of cerebrovascular accident (CVA) ([place])   . Hyperlipidemia   . Hypertension   . Multiple sclerosis ([place]) 2009  . Stroke [facility])     No  family history on file.  Past Surgical History:  Procedure Laterality Date  . APPENDECTOMY    . CYSTOSTOMY W/ BLADDER BIOPSY    . OVARIAN CYST SURGERY    . stent placed in heart     Social History   Occupational History  . Not on file  Tobacco Use  . Smoking status: Never Smoker  . Smokeless tobacco: Never Used  Substance and Sexual Activity  . Alcohol use: Not Currently  . Drug use: Not Currently  . Sexual activity: Not on file

## 2018-12-06 ENCOUNTER — Telehealth: Payer: Self-pay | Admitting: Orthopaedic Surgery

## 2018-12-06 NOTE — Telephone Encounter (Signed)
Received cal from [facility] (OT) stating the caregiver is refusing Stella. Marlori said she asked that they contact the office when ready for (OT)   The number to contact Gustavus Bryant is [phone]

## 2018-12-06 NOTE — Telephone Encounter (Signed)
Please see below and advise.

## 2018-12-06 NOTE — Telephone Encounter (Signed)
OK 

## 2018-12-07 NOTE — Telephone Encounter (Signed)
I left voicemail for [facility] to advise Dr. Lorin Mercy received message.

## 2018-12-18 ENCOUNTER — Telehealth: Payer: Self-pay | Admitting: Orthopaedic Surgery

## 2018-12-18 NOTE — Telephone Encounter (Signed)
I called and spoke with patient and her daughter. Patient states that she is having increasing pain from her ankle to her foot. She does not know that it is due to the cast, but states that the pain is sharp. I asked patient's daughter if she is able to still get fingers into the top and bottom of the cast and she can. Patient denies putting any weight on ankle and states that she is elevating above her heart. Work in appt made for tomorrow morning to check cast and discuss with Dr. Lorin Mercy. I expressed importance of having ankle elevated above heart for some pain control, most especially if there is a lot of swelling. Patient's daughter denies swelling in toes.

## 2018-12-18 NOTE — Telephone Encounter (Signed)
Pt called in said early July she broke her ankle and was now put in a cast but she believes that the cast is extremely tight she's been having constant muscle spasms that are like every 10-15 minutes.   [phone]

## 2018-12-19 ENCOUNTER — Ambulatory Visit (INDEPENDENT_AMBULATORY_CARE_PROVIDER_SITE_OTHER): Payer: Medicare Other | Admitting: Orthopaedic Surgery

## 2018-12-19 ENCOUNTER — Other Ambulatory Visit: Payer: Self-pay

## 2018-12-19 ENCOUNTER — Encounter: Payer: Self-pay | Admitting: Orthopaedic Surgery

## 2018-12-19 ENCOUNTER — Ambulatory Visit: Payer: Self-pay

## 2018-12-19 VITALS — Ht 62.0 in | Wt 147.0 lb

## 2018-12-19 DIAGNOSIS — S82841A Displaced bimalleolar fracture of right lower leg, initial encounter for closed fracture: Secondary | ICD-10-CM | POA: Diagnosis not present

## 2018-12-19 MED ORDER — HYDROCODONE-ACETAMINOPHEN 5-325 MG PO TABS: 1.0000 | ORAL_TABLET | Freq: Two times a day (BID) | ORAL | 0 refills | Status: DC | PRN

## 2018-12-19 NOTE — Progress Notes (Signed)
Office Visit Note   Patient: Hannah [name]           Date of Birth: [birth year]           MRN: [id number] Visit Date: 12/19/2018              Requested by: Vernie Shanks, MD [place],  [place] [address] PCP: Vernie Shanks, MD   Assessment & Plan: Visit Diagnoses:  1. Closed bimalleolar fracture of right ankle, initial encounter     Plan: She can add some ice intermittently laterally over the lateral malleolar fracture.  Cast foot looks good she will return as scheduled on about the 27th with plan previously outlined in last dictation.  Follow-Up Instructions: Return return as scheduled about 8/27.   Orders:  Orders Placed This Encounter  Procedures  . XR Ankle Complete Right   Meds ordered this encounter  Medications  . HYDROcodone-acetaminophen (NORCO/VICODIN) 5-325 MG tablet    Sig: Take 1 tablet by mouth every 12 (twelve) hours as needed for moderate pain. Ankle fracture pain.    Dispense:  30 tablet    Refill:  0      Procedures: No procedures performed   Clinical Data: No additional findings.   Subjective: Chief Complaint  Patient presents with  . Right Ankle - Pain, Fracture    /DOI 11/21/2018    HPI patient returns early for follow-up after casting for right lateral malleolar fracture with deltoid ligament evulsion.  She had severe pain has taken Vicodin.  Date of injury 11/21/27 she was concerned her cast is too tight.  She is here with her daughter is been keeping her foot elevated above her heart.  Past history of CVA.  Review of Systems reviewed updated and unchanged from previous office visit.   Objective: Vital Signs: Ht 5\' 2"  (1.575 m)   Wt 147 lb (66.7 kg)   BMI 26.89 kg/m   Physical Exam Constitutional:      Appearance: She is well-developed.  HENT:     Head: Normocephalic.     Right Ear: External ear normal.     Left Ear: External ear normal.  Eyes:     Pupils: Pupils are equal, round, and reactive to light.  Neck:      Thyroid: No thyromegaly.     Trachea: No tracheal deviation.  Cardiovascular:     Rate and Rhythm: Normal rate.  Pulmonary:     Effort: Pulmonary effort is normal.  Abdominal:     Palpations: Abdomen is soft.  Skin:    General: Skin is warm and dry.  Neurological:     Mental Status: She is alert and oriented to person, place, and time.  Psychiatric:        Behavior: Behavior normal.     Ortho Exam patient has some slurred speech secondary to previous CVA.  Cast fit looks good good capillary refill to the toes.  Specialty Comments:  No specialty comments available.  Imaging: No results found.   PMFS History: Patient Active Problem List   Diagnosis Date Noted  . Bimalleolar ankle fracture 11/24/2018   Past Medical History:  Diagnosis Date  . Cerebral infarction ([place]) 07/12/2017   2009 first one, has had a total of 4 stokes  . Diabetes type 2, controlled ([place])   . Hemiparesis affecting right side as late effect of cerebrovascular accident (CVA) ([place])   . Hyperlipidemia   . Hypertension   . Multiple sclerosis ([place]) 2009  .  Stroke [facility])     No family history on file.  Past Surgical History:  Procedure Laterality Date  . APPENDECTOMY    . CYSTOSTOMY W/ BLADDER BIOPSY    . OVARIAN CYST SURGERY    . stent placed in heart     Social History   Occupational History  . Not on file  Tobacco Use  . Smoking status: Never Smoker  . Smokeless tobacco: Never Used  Substance and Sexual Activity  . Alcohol use: Not Currently  . Drug use: Not Currently  . Sexual activity: Not on file

## 2019-01-04 ENCOUNTER — Telehealth: Payer: Self-pay | Admitting: Orthopaedic Surgery

## 2019-01-04 NOTE — Telephone Encounter (Signed)
Flor with [facility] called in checking on orders for pt for this pt, she said last month they received a call from dr.yates stating to put any pt on hold for now and she's wondering if there is any change and if pt can resume pt?   [phone]

## 2019-01-05 NOTE — Telephone Encounter (Signed)
Please advise 

## 2019-01-05 NOTE — Telephone Encounter (Signed)
Not yet. We will let them know .

## 2019-01-08 NOTE — Telephone Encounter (Signed)
I called Flora and advised.

## 2019-01-09 ENCOUNTER — Ambulatory Visit (INDEPENDENT_AMBULATORY_CARE_PROVIDER_SITE_OTHER): Payer: Medicare Other | Admitting: Orthopaedic Surgery

## 2019-01-09 ENCOUNTER — Encounter: Payer: Self-pay | Admitting: Orthopaedic Surgery

## 2019-01-09 ENCOUNTER — Ambulatory Visit: Payer: Medicare Other

## 2019-01-09 ENCOUNTER — Other Ambulatory Visit: Payer: Self-pay

## 2019-01-09 VITALS — BP 153/83 | HR 78 | Ht 62.0 in | Wt 147.0 lb

## 2019-01-09 DIAGNOSIS — S82841A Displaced bimalleolar fracture of right lower leg, initial encounter for closed fracture: Secondary | ICD-10-CM

## 2019-01-09 NOTE — Progress Notes (Signed)
Office Visit Note   Patient: Hannah [name]           Date of Birth: 02/29/1966           MRN: [id number] Visit Date: 01/09/2019              Requested by: Vernie Shanks, MD [place],  [place] [address] PCP: Vernie Shanks, MD   Assessment & Plan: Visit Diagnoses:  1. Closed bimalleolar fracture of right ankle, initial encounter     Plan: Cam boot applied.  She will work on standing with a walker with touchdown weightbearing only to keep the strength up on her opposite left leg.  Should gradually try to increase the number minute she is able to stand.  She can touchdown weightbearing with her walker and will return in 4 weeks for repeat x-rays right ankle out of her cam boot.  Follow-Up Instructions: No follow-ups on file.   Orders:  Orders Placed This Encounter  Procedures  . XR Ankle Complete Right   No orders of the defined types were placed in this encounter.     Procedures: No procedures performed   Clinical Data: No additional findings.   Subjective: Chief Complaint  Patient presents with  . Right Ankle - Fracture, Follow-up    DOI 11/21/2018    HPI follow-up bimalleolar ankle fracture.  She has been treated nonoperatively in a cast.  X-rays show some callus formation but not abundant.  She is 5 weeks post injury.  Cam boot applied touchdown weightbearing only.  Review of Systems unchanged from 11/24/2018 office visit other than as mentioned HPI.  Of note is her history of previous multiple strokes and also multiple sclerosis.   Objective: Vital Signs: BP (!) 153/83   Pulse 78   Ht 5\' 2"  (1.575 m)   Wt 147 lb (66.7 kg)   BMI 26.89 kg/m   Physical Exam Constitutional:      Appearance: She is well-developed.  HENT:     Head: Normocephalic.     Right Ear: External ear normal.     Left Ear: External ear normal.  Eyes:     Pupils: Pupils are equal, round, and reactive to light.  Neck:     Thyroid: No thyromegaly.     Trachea: No  tracheal deviation.  Cardiovascular:     Rate and Rhythm: Normal rate.  Pulmonary:     Effort: Pulmonary effort is normal.  Abdominal:     Palpations: Abdomen is soft.  Skin:    General: Skin is warm and dry.  Neurological:     Mental Status: She is alert and oriented to person, place, and time.  Psychiatric:        Behavior: Behavior normal.     Ortho Exam mild swelling of the ankle.  Ecchymosis is resolved.  She has slight tenderness over the fibula at the fracture site.  Good capillary refill and palpable pulses.  Specialty Comments:  No specialty comments available.  Imaging: No results found.   PMFS History: Patient Active Problem List   Diagnosis Date Noted  . Bimalleolar ankle fracture 11/24/2018   Past Medical History:  Diagnosis Date  . Cerebral infarction ([place]) 07/12/2017   2009 first one, has had a total of 4 stokes  . Diabetes type 2, controlled ([place])   . Hemiparesis affecting right side as late effect of cerebrovascular accident (CVA) ([place])   . Hyperlipidemia   . Hypertension   . Multiple sclerosis (  [place]) 2009  . Stroke [facility])     No family history on file.  Past Surgical History:  Procedure Laterality Date  . APPENDECTOMY    . CYSTOSTOMY W/ BLADDER BIOPSY    . OVARIAN CYST SURGERY    . stent placed in heart     Social History   Occupational History  . Not on file  Tobacco Use  . Smoking status: Never Smoker  . Smokeless tobacco: Never Used  Substance and Sexual Activity  . Alcohol use: Not Currently  . Drug use: Not Currently  . Sexual activity: Not on file

## 2019-01-10 ENCOUNTER — Other Ambulatory Visit: Payer: Self-pay

## 2019-01-10 MED ORDER — TOPIRAMATE 25 MG PO TABS: 25.0000 mg | ORAL_TABLET | Freq: Two times a day (BID) | ORAL | 3 refills | Status: DC

## 2019-01-11 ENCOUNTER — Other Ambulatory Visit: Payer: Self-pay

## 2019-01-11 MED ORDER — TOPIRAMATE 25 MG PO TABS: 25.0000 mg | ORAL_TABLET | Freq: Two times a day (BID) | ORAL | 3 refills | Status: DC

## 2019-01-23 ENCOUNTER — Ambulatory Visit: Payer: Self-pay | Admitting: Neurology

## 2019-01-24 ENCOUNTER — Encounter: Payer: Self-pay | Admitting: Neurology

## 2019-01-24 ENCOUNTER — Telehealth: Payer: Self-pay

## 2019-01-24 NOTE — Telephone Encounter (Signed)
PT no show for appt on 01/23/2019.

## 2019-02-06 ENCOUNTER — Other Ambulatory Visit: Payer: Self-pay

## 2019-02-06 ENCOUNTER — Ambulatory Visit (INDEPENDENT_AMBULATORY_CARE_PROVIDER_SITE_OTHER): Payer: Medicare Other | Admitting: Orthopaedic Surgery

## 2019-02-06 ENCOUNTER — Ambulatory Visit: Payer: Self-pay

## 2019-02-06 ENCOUNTER — Encounter: Payer: Self-pay | Admitting: Orthopaedic Surgery

## 2019-02-06 VITALS — BP 128/81 | HR 75 | Ht 62.0 in | Wt 147.0 lb

## 2019-02-06 DIAGNOSIS — S82841A Displaced bimalleolar fracture of right lower leg, initial encounter for closed fracture: Secondary | ICD-10-CM

## 2019-02-06 NOTE — Progress Notes (Signed)
Office Visit Note   Patient: Hannah [name]           Date of Birth: [birth year]           MRN: [id number] Visit Date: 02/06/2019              Requested by: Vernie Shanks, MD [place],  [place] [address] PCP: Vernie Shanks, MD   Assessment & Plan: Visit Diagnoses:  1. Closed bimalleolar fracture of right ankle, initial encounter     Plan: Patient can begin 5% weightbearing with her cam boot using her walker.  She can progress as tolerated recheck 7 weeks.  X-rays demonstrate some healing without further displacement.  Follow-Up Instructions: Return in about 7 weeks (around 03/27/2019).   Orders:  Orders Placed This Encounter  Procedures  . XR Ankle Complete Right   No orders of the defined types were placed in this encounter.     Procedures: No procedures performed   Clinical Data: No additional findings.   Subjective: Chief Complaint  Patient presents with  . Right Ankle - Follow-up    DOI 11/21/2018    HPI follow-up lateral malleolar fracture treated closed with past history of CVA and weakness prior to her fall and injury.  She was ambulating with a walker before her injury.  Review of Systems reviewed updated unchanged.   Objective: Vital Signs: BP 128/81   Pulse 75   Ht 5\' 2"  (1.575 m)   Wt 147 lb (66.7 kg)   BMI 26.89 kg/m   Physical Exam Constitutional:      Appearance: She is well-developed.  HENT:     Head: Normocephalic.     Right Ear: External ear normal.     Left Ear: External ear normal.  Eyes:     Pupils: Pupils are equal, round, and reactive to light.  Neck:     Thyroid: No thyromegaly.     Trachea: No tracheal deviation.  Cardiovascular:     Rate and Rhythm: Normal rate.  Pulmonary:     Effort: Pulmonary effort is normal.  Abdominal:     Palpations: Abdomen is soft.  Skin:    General: Skin is warm and dry.  Neurological:     Mental Status: She is alert and oriented to person, place, and time.  Psychiatric:         Behavior: Behavior normal.     Ortho Exam patient has some slight expressive a aphasia.  She has some tenderness at the distal fibula swelling is down no ecchymosis.  No tenderness medially over the deltoid.  Specialty Comments:  No specialty comments available.  Imaging: Xr Ankle Complete Right  Result Date: 02/06/2019 Three-view x-rays right ankle obtained and reviewed this shows partial healing of the distal fibula oblique fracture without further displacement. Impression: Partial healing lateral malleolar fracture without further displacement.    PMFS History: Patient Active Problem List   Diagnosis Date Noted  . Bimalleolar ankle fracture 11/24/2018   Past Medical History:  Diagnosis Date  . Cerebral infarction ([place]) 07/12/2017   2009 first one, has had a total of 4 stokes  . Diabetes type 2, controlled ([place])   . Hemiparesis affecting right side as late effect of cerebrovascular accident (CVA) ([place])   . Hyperlipidemia   . Hypertension   . Multiple sclerosis ([place]) 2009  . Stroke [facility])     No family history on file.  Past Surgical History:  Procedure Laterality Date  . APPENDECTOMY    .  CYSTOSTOMY W/ BLADDER BIOPSY    . OVARIAN CYST SURGERY    . stent placed in heart     Social History   Occupational History  . Not on file  Tobacco Use  . Smoking status: Never Smoker  . Smokeless tobacco: Never Used  Substance and Sexual Activity  . Alcohol use: Not Currently  . Drug use: Not Currently  . Sexual activity: Not on file

## 2019-03-27 ENCOUNTER — Ambulatory Visit: Payer: Medicare Other | Admitting: Orthopaedic Surgery

## 2019-04-10 ENCOUNTER — Ambulatory Visit: Payer: Self-pay

## 2019-04-10 ENCOUNTER — Other Ambulatory Visit: Payer: Self-pay

## 2019-04-10 ENCOUNTER — Ambulatory Visit: Payer: Medicare Other | Admitting: Orthopaedic Surgery

## 2019-04-10 DIAGNOSIS — S82841A Displaced bimalleolar fracture of right lower leg, initial encounter for closed fracture: Secondary | ICD-10-CM | POA: Diagnosis not present

## 2019-04-15 NOTE — Progress Notes (Signed)
Office Visit Note   Patient: Hannah [name]           Date of Birth: Feb 06, 1966           MRN: [id number] Visit Date: 04/10/2019              Requested by: Vernie Shanks, MD [place],  [place] [address] PCP: Vernie Shanks, MD   Assessment & Plan: Visit Diagnoses:  1. Closed bimalleolar fracture of right ankle, initial encounter     Plan: Cam boot application progressive weightbearing.  Ankle range of motion out of the boot.  Recheck 4 weeks.  She will work on progressive weightbearing with her walker as tolerated.  Follow-Up Instructions: Return in about 4 weeks (around 05/08/2019).   Orders:  Orders Placed This Encounter  Procedures  . XR Ankle Complete Right   No orders of the defined types were placed in this encounter.     Procedures: No procedures performed   Clinical Data: No additional findings.   Subjective: No chief complaint on file.   HPI follow-up right ankle fracture treated with cast immobilization.  Patient states her ankle is not hurting out of cast.  She has been touchdown weightbearing.  Past history of CVA.  Review of Systems updated unchanged.   Objective: Vital Signs: There were no vitals taken for this visit.  Physical Exam Constitutional:      Appearance: She is well-developed.  HENT:     Head: Normocephalic.     Right Ear: External ear normal.     Left Ear: External ear normal.  Eyes:     Pupils: Pupils are equal, round, and reactive to light.  Neck:     Thyroid: No thyromegaly.     Trachea: No tracheal deviation.  Cardiovascular:     Rate and Rhythm: Normal rate.  Pulmonary:     Effort: Pulmonary effort is normal.  Abdominal:     Palpations: Abdomen is soft.  Skin:    General: Skin is warm and dry.  Neurological:     Mental Status: She is alert and oriented to person, place, and time.  Psychiatric:        Behavior: Behavior normal.     Ortho Exam no tenderness over the distal lateral malleolus.  No  tenderness over the medial deltoid ligament.  Negative anterior drawer.  Skin is intact distal pulses intact section of the plantar surface of her foot is intact.  Specialty Comments:  No specialty comments available.  Imaging: No results found.   PMFS History: Patient Active Problem List   Diagnosis Date Noted  . Bimalleolar ankle fracture 11/24/2018   Past Medical History:  Diagnosis Date  . Cerebral infarction ([place]) 07/12/2017   2009 first one, has had a total of 4 stokes  . Diabetes type 2, controlled ([place])   . Hemiparesis affecting right side as late effect of cerebrovascular accident (CVA) ([place])   . Hyperlipidemia   . Hypertension   . Multiple sclerosis ([place]) 2009  . Stroke [facility])     No family history on file.  Past Surgical History:  Procedure Laterality Date  . APPENDECTOMY    . CYSTOSTOMY W/ BLADDER BIOPSY    . OVARIAN CYST SURGERY    . stent placed in heart     Social History   Occupational History  . Not on file  Tobacco Use  . Smoking status: Never Smoker  . Smokeless tobacco: Never Used  Substance and Sexual Activity  .  Alcohol use: Not Currently  . Drug use: Not Currently  . Sexual activity: Not on file

## 2019-05-09 ENCOUNTER — Ambulatory Visit: Payer: Medicare Other | Admitting: Orthopaedic Surgery

## 2019-07-17 ENCOUNTER — Other Ambulatory Visit: Payer: Self-pay | Admitting: Nephrology

## 2019-07-17 DIAGNOSIS — N184 Chronic kidney disease, stage 4 (severe): Secondary | ICD-10-CM

## 2019-08-13 ENCOUNTER — Other Ambulatory Visit: Payer: Medicare Other

## 2019-08-16 ENCOUNTER — Other Ambulatory Visit: Payer: Medicare Other

## 2019-08-29 ENCOUNTER — Ambulatory Visit
Admission: RE | Admit: 2019-08-29 | Discharge: 2019-08-29 | Disposition: A | Discharge status: Home / Self Care | Payer: Medicare Other | Source: Ambulatory Visit | Attending: Nephrology | Admitting: Nephrology

## 2019-08-29 DIAGNOSIS — N184 Chronic kidney disease, stage 4 (severe): Secondary | ICD-10-CM

## 2020-05-23 DIAGNOSIS — Z794 Long term (current) use of insulin: Secondary | ICD-10-CM | POA: Diagnosis not present

## 2020-05-23 DIAGNOSIS — E119 Type 2 diabetes mellitus without complications: Secondary | ICD-10-CM | POA: Diagnosis not present

## 2020-06-05 ENCOUNTER — Encounter: Payer: Self-pay | Admitting: Physical Therapy

## 2020-06-05 NOTE — Therapy (Signed)
[facility] [address] [place], [place], [address] Phone: [phone]   Fax:  [phone]  Patient Details  Name: Hannah [name] MRN: [id number] Date of Birth: January 26, 1966 Referring Provider:  No ref. provider found  Encounter Date: 06/05/2020  PHYSICAL THERAPY DISCHARGE SUMMARY  Visits from Start of Care: 14  Current functional level related to goals / functional outcomes: Unable to formally assess; pt did not return after 14th visit.   Remaining deficits: Impaired strength, impaired balance and impaired gait   Education / Equipment: HEP  Plan: Patient agrees to discharge.  Patient goals were not met. Patient is being discharged due to not returning since the last visit.  ?????     Rico Junker, PT, DPT 06/05/20    1:01 PM   [facility] [address] [place], [place], [address] Phone: [phone]   Fax:  [phone]

## 2020-06-09 ENCOUNTER — Encounter (HOSPITAL_COMMUNITY): Payer: Self-pay | Admitting: Emergency Medicine

## 2020-06-09 ENCOUNTER — Emergency Department (HOSPITAL_COMMUNITY): Payer: Medicare Other

## 2020-06-09 ENCOUNTER — Inpatient Hospital Stay (HOSPITAL_COMMUNITY): Payer: Medicare Other

## 2020-06-09 ENCOUNTER — Inpatient Hospital Stay (HOSPITAL_COMMUNITY)
Admission: EM | Admit: 2020-06-09 | Discharge: 2020-06-12 | DRG: 065 | Disposition: A | Discharge status: Rehabilitation Facility | Payer: Medicare Other | Attending: Internal Medicine | Admitting: Internal Medicine

## 2020-06-09 DIAGNOSIS — I10 Essential (primary) hypertension: Secondary | ICD-10-CM | POA: Diagnosis present

## 2020-06-09 DIAGNOSIS — I69351 Hemiplegia and hemiparesis following cerebral infarction affecting right dominant side: Secondary | ICD-10-CM

## 2020-06-09 DIAGNOSIS — I129 Hypertensive chronic kidney disease with stage 1 through stage 4 chronic kidney disease, or unspecified chronic kidney disease: Secondary | ICD-10-CM | POA: Diagnosis not present

## 2020-06-09 DIAGNOSIS — R2990 Unspecified symptoms and signs involving the nervous system: Secondary | ICD-10-CM | POA: Diagnosis present

## 2020-06-09 DIAGNOSIS — Z20822 Contact with and (suspected) exposure to covid-19: Secondary | ICD-10-CM | POA: Diagnosis present

## 2020-06-09 DIAGNOSIS — R6889 Other general symptoms and signs: Secondary | ICD-10-CM | POA: Diagnosis not present

## 2020-06-09 DIAGNOSIS — R251 Tremor, unspecified: Secondary | ICD-10-CM | POA: Diagnosis not present

## 2020-06-09 DIAGNOSIS — E785 Hyperlipidemia, unspecified: Secondary | ICD-10-CM | POA: Diagnosis present

## 2020-06-09 DIAGNOSIS — G9389 Other specified disorders of brain: Secondary | ICD-10-CM | POA: Diagnosis not present

## 2020-06-09 DIAGNOSIS — E669 Obesity, unspecified: Secondary | ICD-10-CM | POA: Diagnosis not present

## 2020-06-09 DIAGNOSIS — Z7984 Long term (current) use of oral hypoglycemic drugs: Secondary | ICD-10-CM

## 2020-06-09 DIAGNOSIS — I639 Cerebral infarction, unspecified: Secondary | ICD-10-CM

## 2020-06-09 DIAGNOSIS — H5509 Other forms of nystagmus: Secondary | ICD-10-CM | POA: Diagnosis present

## 2020-06-09 DIAGNOSIS — Z743 Need for continuous supervision: Secondary | ICD-10-CM | POA: Diagnosis not present

## 2020-06-09 DIAGNOSIS — Z886 Allergy status to analgesic agent status: Secondary | ICD-10-CM

## 2020-06-09 DIAGNOSIS — E1165 Type 2 diabetes mellitus with hyperglycemia: Secondary | ICD-10-CM | POA: Diagnosis present

## 2020-06-09 DIAGNOSIS — R29818 Other symptoms and signs involving the nervous system: Secondary | ICD-10-CM | POA: Diagnosis not present

## 2020-06-09 DIAGNOSIS — R29810 Facial weakness: Secondary | ICD-10-CM | POA: Diagnosis not present

## 2020-06-09 DIAGNOSIS — Z885 Allergy status to narcotic agent status: Secondary | ICD-10-CM | POA: Diagnosis not present

## 2020-06-09 DIAGNOSIS — N184 Chronic kidney disease, stage 4 (severe): Secondary | ICD-10-CM | POA: Diagnosis not present

## 2020-06-09 DIAGNOSIS — E11649 Type 2 diabetes mellitus with hypoglycemia without coma: Secondary | ICD-10-CM | POA: Diagnosis not present

## 2020-06-09 DIAGNOSIS — R471 Dysarthria and anarthria: Secondary | ICD-10-CM | POA: Diagnosis not present

## 2020-06-09 DIAGNOSIS — I6350 Cerebral infarction due to unspecified occlusion or stenosis of unspecified cerebral artery: Secondary | ICD-10-CM | POA: Diagnosis not present

## 2020-06-09 DIAGNOSIS — Z79899 Other long term (current) drug therapy: Secondary | ICD-10-CM | POA: Diagnosis not present

## 2020-06-09 DIAGNOSIS — R531 Weakness: Secondary | ICD-10-CM | POA: Diagnosis not present

## 2020-06-09 DIAGNOSIS — E119 Type 2 diabetes mellitus without complications: Secondary | ICD-10-CM

## 2020-06-09 DIAGNOSIS — Z794 Long term (current) use of insulin: Secondary | ICD-10-CM

## 2020-06-09 DIAGNOSIS — G35 Multiple sclerosis: Secondary | ICD-10-CM | POA: Diagnosis present

## 2020-06-09 DIAGNOSIS — I6389 Other cerebral infarction: Secondary | ICD-10-CM | POA: Diagnosis not present

## 2020-06-09 DIAGNOSIS — E1122 Type 2 diabetes mellitus with diabetic chronic kidney disease: Secondary | ICD-10-CM | POA: Diagnosis present

## 2020-06-09 DIAGNOSIS — I6329 Cerebral infarction due to unspecified occlusion or stenosis of other precerebral arteries: Secondary | ICD-10-CM | POA: Diagnosis not present

## 2020-06-09 DIAGNOSIS — R270 Ataxia, unspecified: Secondary | ICD-10-CM | POA: Diagnosis present

## 2020-06-09 DIAGNOSIS — D62 Acute posthemorrhagic anemia: Secondary | ICD-10-CM | POA: Diagnosis not present

## 2020-06-09 DIAGNOSIS — E1169 Type 2 diabetes mellitus with other specified complication: Secondary | ICD-10-CM | POA: Diagnosis not present

## 2020-06-09 DIAGNOSIS — Z7902 Long term (current) use of antithrombotics/antiplatelets: Secondary | ICD-10-CM | POA: Diagnosis not present

## 2020-06-09 DIAGNOSIS — H547 Unspecified visual loss: Secondary | ICD-10-CM | POA: Diagnosis present

## 2020-06-09 DIAGNOSIS — R29707 NIHSS score 7: Secondary | ICD-10-CM | POA: Diagnosis present

## 2020-06-09 DIAGNOSIS — G939 Disorder of brain, unspecified: Secondary | ICD-10-CM | POA: Diagnosis not present

## 2020-06-09 DIAGNOSIS — I6330 Cerebral infarction due to thrombosis of unspecified cerebral artery: Secondary | ICD-10-CM | POA: Diagnosis not present

## 2020-06-09 DIAGNOSIS — Z888 Allergy status to other drugs, medicaments and biological substances status: Secondary | ICD-10-CM

## 2020-06-09 DIAGNOSIS — Z7982 Long term (current) use of aspirin: Secondary | ICD-10-CM | POA: Diagnosis not present

## 2020-06-09 DIAGNOSIS — N179 Acute kidney failure, unspecified: Secondary | ICD-10-CM | POA: Diagnosis not present

## 2020-06-09 DIAGNOSIS — K219 Gastro-esophageal reflux disease without esophagitis: Secondary | ICD-10-CM | POA: Diagnosis present

## 2020-06-09 DIAGNOSIS — I69354 Hemiplegia and hemiparesis following cerebral infarction affecting left non-dominant side: Secondary | ICD-10-CM | POA: Diagnosis not present

## 2020-06-09 DIAGNOSIS — N189 Chronic kidney disease, unspecified: Secondary | ICD-10-CM | POA: Diagnosis present

## 2020-06-09 DIAGNOSIS — G8191 Hemiplegia, unspecified affecting right dominant side: Secondary | ICD-10-CM | POA: Diagnosis not present

## 2020-06-09 DIAGNOSIS — R4781 Slurred speech: Secondary | ICD-10-CM | POA: Diagnosis not present

## 2020-06-09 DIAGNOSIS — G35D Multiple sclerosis, unspecified: Secondary | ICD-10-CM | POA: Diagnosis present

## 2020-06-09 LAB — CBC
HCT: 34 % — ABNORMAL LOW (ref 36.0–46.0)
Hemoglobin: 11 g/dL — ABNORMAL LOW (ref 12.0–15.0)
MCH: 28.1 pg (ref 26.0–34.0)
MCHC: 32.4 g/dL (ref 30.0–36.0)
MCV: 87 fL (ref 80.0–100.0)
Platelets: 244 10*3/uL (ref 150–400)
RBC: 3.91 MIL/uL (ref 3.87–5.11)
RDW: 12.3 % (ref 11.5–15.5)
WBC: 6.3 10*3/uL (ref 4.0–10.5)
nRBC: 0 % (ref 0.0–0.2)

## 2020-06-09 LAB — GLUCOSE, CAPILLARY
Glucose-Capillary: 196 mg/dL — ABNORMAL HIGH (ref 70–99)
Glucose-Capillary: 102 mg/dL — ABNORMAL HIGH (ref 70–99)

## 2020-06-09 LAB — COMPREHENSIVE METABOLIC PANEL
ALT: 10 U/L (ref 0–44)
AST: 17 U/L (ref 15–41)
Albumin: 3.1 g/dL — ABNORMAL LOW (ref 3.5–5.0)
Alkaline Phosphatase: 67 U/L (ref 38–126)
Anion gap: 11 (ref 5–15)
BUN: 39 mg/dL — ABNORMAL HIGH (ref 6–20)
CO2: 24 mmol/L (ref 22–32)
Calcium: 9.2 mg/dL (ref 8.9–10.3)
Chloride: 103 mmol/L (ref 98–111)
Creatinine, Ser: 4.11 mg/dL — ABNORMAL HIGH (ref 0.44–1.00)
GFR, Estimated: 12 mL/min — ABNORMAL LOW (ref >60)
Glucose, Bld: 161 mg/dL — ABNORMAL HIGH (ref 70–99)
Potassium: 3.8 mmol/L (ref 3.5–5.1)
Sodium: 138 mmol/L (ref 135–145)
Total Bilirubin: 0.4 mg/dL (ref 0.3–1.2)
Total Protein: 7.6 g/dL (ref 6.5–8.1)

## 2020-06-09 LAB — CBG MONITORING, ED
Glucose-Capillary: 147 mg/dL — ABNORMAL HIGH (ref 70–99)
Glucose-Capillary: 203 mg/dL — ABNORMAL HIGH (ref 70–99)

## 2020-06-09 LAB — APTT: aPTT: 29 seconds (ref 24–36)

## 2020-06-09 LAB — PROTIME-INR
INR: 0.9 (ref 0.8–1.2)
Prothrombin Time: 11.7 seconds (ref 11.4–15.2)

## 2020-06-09 LAB — I-STAT CHEM 8, ED
BUN: 42 mg/dL — ABNORMAL HIGH (ref 6–20)
Calcium, Ion: 1.12 mmol/L — ABNORMAL LOW (ref 1.15–1.40)
Chloride: 102 mmol/L (ref 98–111)
Creatinine, Ser: 4.3 mg/dL — ABNORMAL HIGH (ref 0.44–1.00)
Glucose, Bld: 157 mg/dL — ABNORMAL HIGH (ref 70–99)
HCT: 31 % — ABNORMAL LOW (ref 36.0–46.0)
Hemoglobin: 10.5 g/dL — ABNORMAL LOW (ref 12.0–15.0)
Potassium: 3.8 mmol/L (ref 3.5–5.1)
Sodium: 139 mmol/L (ref 135–145)
TCO2: 26 mmol/L (ref 22–32)

## 2020-06-09 LAB — DIFFERENTIAL
Abs Immature Granulocytes: 0.01 10*3/uL (ref 0.00–0.07)
Basophils Absolute: 0 10*3/uL (ref 0.0–0.1)
Basophils Relative: 1 %
Eosinophils Absolute: 0.2 10*3/uL (ref 0.0–0.5)
Eosinophils Relative: 3 %
Immature Granulocytes: 0 %
Lymphocytes Relative: 27 %
Lymphs Abs: 1.7 10*3/uL (ref 0.7–4.0)
Monocytes Absolute: 0.5 10*3/uL (ref 0.1–1.0)
Monocytes Relative: 8 %
Neutro Abs: 3.9 10*3/uL (ref 1.7–7.7)
Neutrophils Relative %: 61 %

## 2020-06-09 LAB — SARS CORONAVIRUS 2 (TAT 6-24 HRS): SARS Coronavirus 2: NEGATIVE

## 2020-06-09 LAB — TSH: TSH: 0.623 u[IU]/mL (ref 0.350–4.500)

## 2020-06-09 LAB — I-STAT BETA HCG BLOOD, ED (MC, WL, AP ONLY): I-stat hCG, quantitative: <5 m[IU]/mL (ref <5)

## 2020-06-09 LAB — ETHANOL: Alcohol, Ethyl (B): <10 mg/dL (ref <10)

## 2020-06-09 MED ORDER — INSULIN GLARGINE 100 UNIT/ML ~~LOC~~ SOLN
35.0000 [IU] | Freq: Every day | SUBCUTANEOUS | Status: DC
  Administered 2020-06-09 – 2020-06-11 (×3): 35 [IU] via SUBCUTANEOUS
  Filled 2020-06-09 (×4): qty 0.35

## 2020-06-09 MED ORDER — ACETAMINOPHEN 325 MG PO TABS
650.0000 mg | ORAL_TABLET | ORAL | Status: DC | PRN
  Administered 2020-06-09: 650 mg via ORAL
  Filled 2020-06-09: qty 2

## 2020-06-09 MED ORDER — INSULIN DEGLUDEC 100 UNIT/ML ~~LOC~~ SOLN: 35.0000 [IU] | Freq: Every day | SUBCUTANEOUS | Status: DC

## 2020-06-09 MED ORDER — SODIUM CHLORIDE 0.9% FLUSH: 3.0000 mL | Freq: Once | INTRAVENOUS | Status: DC

## 2020-06-09 MED ORDER — ROSUVASTATIN CALCIUM 20 MG PO TABS
20.0000 mg | ORAL_TABLET | Freq: Every day | ORAL | Status: DC
  Administered 2020-06-09 – 2020-06-12 (×4): 20 mg via ORAL
  Filled 2020-06-09 (×4): qty 1

## 2020-06-09 MED ORDER — SODIUM CHLORIDE 0.9 % IV SOLN: INTRAVENOUS | Status: DC

## 2020-06-09 MED ORDER — GADOBUTROL 1 MMOL/ML IV SOLN
3.0000 mL | Freq: Once | INTRAVENOUS | Status: AC | PRN
  Administered 2020-06-09: 3 mL via INTRAVENOUS

## 2020-06-09 MED ORDER — ASPIRIN 300 MG RE SUPP: 300.0000 mg | Freq: Every day | RECTAL | Status: DC

## 2020-06-09 MED ORDER — ACETAMINOPHEN 160 MG/5ML PO SOLN: 650.0000 mg | ORAL | Status: DC | PRN

## 2020-06-09 MED ORDER — STROKE: EARLY STAGES OF RECOVERY BOOK
Freq: Once | Status: AC
  Administered 2020-06-09: 1
  Filled 2020-06-09: qty 1

## 2020-06-09 MED ORDER — INSULIN ASPART 100 UNIT/ML ~~LOC~~ SOLN: 0.0000 [IU] | Freq: Every day | SUBCUTANEOUS | Status: DC

## 2020-06-09 MED ORDER — FLUOXETINE HCL 20 MG PO CAPS
20.0000 mg | ORAL_CAPSULE | Freq: Every day | ORAL | Status: DC
Start: 2020-06-09 — End: 2020-06-12
  Administered 2020-06-09 – 2020-06-12 (×4): 20 mg via ORAL
  Filled 2020-06-09 (×4): qty 1

## 2020-06-09 MED ORDER — INSULIN ASPART 100 UNIT/ML ~~LOC~~ SOLN
0.0000 [IU] | Freq: Three times a day (TID) | SUBCUTANEOUS | Status: DC
  Administered 2020-06-09: 3 [IU] via SUBCUTANEOUS
  Administered 2020-06-09: 5 [IU] via SUBCUTANEOUS
  Administered 2020-06-10: 3 [IU] via SUBCUTANEOUS
  Administered 2020-06-10 – 2020-06-11 (×3): 2 [IU] via SUBCUTANEOUS
  Administered 2020-06-12: 3 [IU] via SUBCUTANEOUS

## 2020-06-09 MED ORDER — ASPIRIN EC 81 MG PO TBEC
81.0000 mg | DELAYED_RELEASE_TABLET | Freq: Every day | ORAL | Status: DC
Start: 2020-06-10 — End: 2020-06-12
  Administered 2020-06-10 – 2020-06-12 (×3): 81 mg via ORAL
  Filled 2020-06-09 (×3): qty 1

## 2020-06-09 MED ORDER — CLOPIDOGREL BISULFATE 75 MG PO TABS
75.0000 mg | ORAL_TABLET | Freq: Every day | ORAL | Status: DC
  Administered 2020-06-10 – 2020-06-12 (×3): 75 mg via ORAL
  Filled 2020-06-09 (×3): qty 1

## 2020-06-09 MED ORDER — SENNOSIDES-DOCUSATE SODIUM 8.6-50 MG PO TABS: 1.0000 | ORAL_TABLET | Freq: Every evening | ORAL | Status: DC | PRN

## 2020-06-09 MED ORDER — ENOXAPARIN SODIUM 40 MG/0.4ML ~~LOC~~ SOLN: 40.0000 mg | SUBCUTANEOUS | Status: DC

## 2020-06-09 MED ORDER — ENOXAPARIN SODIUM 30 MG/0.3ML ~~LOC~~ SOLN
30.0000 mg | SUBCUTANEOUS | Status: DC
  Administered 2020-06-10: 30 mg via SUBCUTANEOUS
  Filled 2020-06-09: qty 0.3

## 2020-06-09 MED ORDER — ACETAMINOPHEN 650 MG RE SUPP: 650.0000 mg | RECTAL | Status: DC | PRN

## 2020-06-09 MED ORDER — ASPIRIN 325 MG PO TABS
325.0000 mg | ORAL_TABLET | Freq: Every day | ORAL | Status: DC
  Administered 2020-06-09: 325 mg via ORAL
  Filled 2020-06-09: qty 1

## 2020-06-09 NOTE — Consult Note (Addendum)
Neurology Consultation  CC: Stroke alert, right facial droop, visual changes Consulting physician: Dr. Melina Copa  History is obtained from: chart review, EMS, Care Everywhere, patient, patient's daughter via telephone  HPI: Hannah [name] is a 55 y.o. female with a medical history significant for CKD stage IV, essential hypertension, uncontrolled type 2 diabetes mellitus, multiple strokes on home aspirin and clopiodogrel with right-sided residual deficits, GERD, MS (records not available), and chorioretinal inflammation of both eyes, who presented to the ED today 06/09/20 with new onset visual changes and right-sided facial droop.   This morning, Hannah [name] complained of vision changes to her daughter stating that she felt the "room was spinning" and her daughter noticed right facial droop and dysconjugate gaze. EMS endorses initial left-sided weakness that had resolved on hospital arrival and noted spastic movements of the upper and lower extremities during transfer from home to EMS stretcher. Of note, initial blood pressure was 208/102 and her initial creatinine on hospital arrival was 4.3 (creatinine in Feb. 2021 2.81). Patient complains of diplopia, nausea, and a "room spinning" sensation on arrival to the ED.   According to Hannah [name]'s daughter, Hannah [name], she had concern for hypoglycemia overnight noting that her glucose monitor showed dips of blood glucose possibly into the 50's overnight but with normal AM readings from home and EMS.   At baseline, Hannah [name] has right-sided spastic hemiparesis and needs assistance with ambulation. She uses a walker or wheelchair at home. Per Hannah [name]'s daughter, Hannah [name] does have slurred speech at baseline and delayed responses which is worse when she is tired. Daughter claims she believes her mother may have had one seizure after one of her CVAs but is not on AEDs  at this time. Daughter recently had [place] and was quarantined away from the patient for the past 10 days  (Jan 10th symptom onset, another family member has been caring for the patient).  The patient has a chronic cough at baseline since bronchitis a year ago but no new cold symptoms.     Regarding her multiple sclerosis history, Dr. Leonie Man has requested the records for review but these have not yet been made available to him.  Apparently a neurologist in [facility] was unconvinced about her MS diagnosis although the patient reports that she does believe she has multiple sclerosis.  She reports that her very first episode of multiple sclerosis in her 71s did present similarly with double vision and room spinning and difficulty walking.  Regarding her stroke history, per Dr. Leonie Man outpatient note 08/07/2018: "I also received her medical records from [facility] for admission from 01/06/2016 to 01/09/2016 where she was admitted with worsening slurred speech and right-sided weakness with NIH stroke scale of 6.  MRI showed enhancing right pontine subacute infarct as well as old left lentiform nucleus, internal capsule, left cerebral peduncle wallerian degeneration and left pontine and left cerebellar remote age lacunar infarcts.  There was a small focus of hemorrhage noted in the right putamen possibly a cavernoma. "   Additionally, she was seen by Dr. Dwana Melena Ophthalmology 06/19/19, who is concerned for focal chorioretinal inflammation of both eyes.  Daughter reports that she was told that the patient's eye issues were secondary to diabetes and her diabetes needed to be under better control before they proceeded further with any treatment.  However review of records reports she has previously been treated with "Kenalog OU on 03/20/19 with Dr. Alanda Slim for CME" and that they were considering immune modifying treatment.  Broad work-up including labs to guide potential immune modifying treatment were drawn but the patient has not had further follow-up with ophthalmology despite plan for close follow-up  in 3 weeks after that appointment. Labs drawn at this visit were notable for: Hepatitis A IgG positive but IgM was nonreactive, toxoplasma antibody was negative (IgM, IgG), rheumatoid factor negative, RPR negative and TP-PA negative, lysozyme 13.5 (reference range 2.5-12.9 mcg/mL), Lyme negative, serum ACE 34 (normal), anti-CCP negative, HLA panel sent out - no results available, TB QuantiFERON negative, SPEP notable for mild hypergammaglobulinemia (1.7, ref range 0.5-1.6 g/dL), proteinase 3 negative, HIV negative, IL-2 receptor (CD25) 1757 (ref range 175.3-858.2), myeloperoxidase antibody negative, c-ANCA positive (titer 1:64), ANA negative, anti-DNase B neg  Additionally, the patient has been seeing a kidney doctor as well -- but the patient did not want to continue follow-up.  Daughter reports the patient's last nephrology appointment was day before Thanksgiving which she cancelled due to anxiety, and that the patient has refused rescheduling when her daughter suggested it).  To the daughter's knowledge, the patient has not had a biopsy of her kidneys.  LKW: 06/08/20 22:30 tpa given?: No, outside of time window IR Thrombectomy? No, no evidence of LVO on neurological exam or on initial brain imaging Modified Rankin Scale: 4-Needs assistance to walk and tend to bodily needs NIHSS:  1a Level of Conscious.: 0 1b LOC Questions: 0 1c LOC Commands: 0 2 Best Gaze: 1, partial gaze palsy in the right eye with poor lateral movement; especially medially 3 Visual: 0, attends to movement in all visual fields 4 Facial Palsy: 2, right facial droop 5a Motor Arm - left: 0 5b Motor Arm - Right: 1 6a Motor Leg - Left: 0 6b Motor Leg - Right: 1 7 Limb Ataxia: 2; right-sided ataxia at baseline 8 Sensory: 0 9 Best Language: 0 10 Dysarthria: 1; dysarthric at baseline 11 Extinct. and Inatten.: 0 TOTAL: 8  ROS: A complete ROS was performed and is negative except as noted in the HPI.   Past Medical History:   Diagnosis Date  . Cerebral infarction ([place]) 07/12/2017   2009 first one, has had a total of 4 stokes  . Diabetes type 2, controlled ([place])   . Hemiparesis affecting right side as late effect of cerebrovascular accident (CVA) ([place])   . Hyperlipidemia   . Hypertension   . Multiple sclerosis ([place]) 2009  . Stroke [facility])    No family history on file.  Social History:  reports that she has never smoked. She has never used smokeless tobacco. She reports previous alcohol use. She reports previous drug use.  Current Scheduled Medications: Current Outpatient Medications  Medication Instructions  . amLODipine (NORVASC) 10 mg, Oral, Daily  . aspirin 81 MG chewable tablet Oral  . Blood Pressure Monitor DEVI 1 Units, Does not apply, 2 times daily  . clopidogrel (PLAVIX) 75 MG tablet No dose, route, or frequency recorded.  . famotidine (PEPCID) 40 MG tablet No dose, route, or frequency recorded.  Marland Kitchen FLUoxetine (PROZAC) 20 MG capsule No dose, route, or frequency recorded.  Marland Kitchen glipiZIDE (GLUCOTROL) 10 MG tablet TAKE 1 TABLET BY MOUTH TWICE DAILY BEFORE  MEALS.  . hydrochlorothiazide (HYDRODIURIL) 25 MG tablet No dose, route, or frequency recorded.  Marland Kitchen HYDROcodone-acetaminophen (NORCO/VICODIN) 5-325 MG tablet 1 tablet, Oral, Every 8 hours PRN  . HYDROcodone-acetaminophen (NORCO/VICODIN) 5-325 MG tablet 1 tablet, Oral, Every 12 hours PRN, Ankle fracture pain.  . Insulin Degludec (TRESIBA [place]) 35-40 Units, Subcutaneous, Daily at bedtime  .  insulin lispro (HUMALOG KWIKPEN) 100 UNIT/ML KwikPen Subcutaneous  . lisinopril (PRINIVIL,ZESTRIL) 40 MG tablet Oral  . losartan (COZAAR) 50 MG tablet No dose, route, or frequency recorded.  . metoprolol tartrate (LOPRESSOR) 100 mg, Oral, 2 times daily  . nitroGLYCERIN (NITROSTAT) 0.4 MG SL tablet DISSOLVE ONE TABLET UNDER THE TONGUE EVERY 5 MINUTES AS NEEDED FOR CHEST PAIN.  DO NOT EXCEED A TOTAL OF 3 DOSES IN 15 MINUTES  . ondansetron (ZOFRAN) 4 mg, Oral, Every 8 hours  PRN  . rosuvastatin (CRESTOR) 20 MG tablet Oral  . topiramate (TOPAMAX) 25 mg, Oral, 2 times daily   Exam: Current vital signs: BP (!) 177/91   Pulse 77   Temp 98.1 F (36.7 C) (Oral)   Resp 16   SpO2 100%   Physical Exam  Constitutional: Appears well-developed and well-nourished.  Psych: Affect appropriate to situation Eyes: Dysconjugate, normal conjunctiva HENT: No OP obstruction.  Head: Normocephalic, atraumatic Cardiovascular: Normal rate, extremities warm and without edema Respiratory: Effort normal, non-labored breathing.  GI: Soft.  Non-distended Skin: WDI  Neuro: Mental Status: Patient is awake, alert, oriented to person, place, month, year, and situation. Patient is able to give some history but history is limited due to delayed response; this is patient's baseline per daughter. No signs of aphasia are present, naming and recognition intact. Repetition intact on second attempt each time. Speech is mildly dysarthric- this is baseline per daughter. No signs of neglect.  Cranial Nerves: II: She attends to motion in all visual fields. Pupils are irregular and sluggishly reactive to light bilaterally.   III,IV, VI: Partial gaze palsy noted in the right eye with leftward gaze. Right eye with poor lateral movement, especially medially. Gaze disconjugate. Able to attend to each visual field with extensive coaching. Gaze with direction changing nystagmus with a notable torsional component.  V: Facial sensation is symmetric to light touch and temperature. VII: Facial movement is asymmetric resting and smiling with notable right-sided facial droop.   VIII: Hearing is intact to voice X: Palate elevates symmetrically XI: Shoulder shrug is asymmetric with decreased strength on the right.  XII: Tongue protrudes midline without fasciculations.  Motor: Tone is increased on the right, normal on the left extremities. Bulk is normal. 4/5 strength noted on the right upper and lower extremity  with 5/5 strength on the left upper and lower extremities. Antigravity movement present in all four extremities. Pronator drift noted to the right upper and lower extremities but without drift to bed. No drift on the left upper or lower extremities.  Sensory: Sensation is symmetric to light touch and temperature in the arms and legs. Deep Tendon Reflexes: 2+ in the biceps and patellae on the left extremities; 1+ bicep and patellar on right.  Plantars: Toes are downgoing bilaterally. Cerebellar: FNF and HKS are intact on the left extremities, ataxic on the right upper and lower extremities; this is baseline for patient per chart review.   I have reviewed labs in epic and the pertinent results are: CBC    Component Value Date/Time   WBC 6.3 06/09/2020 0750   RBC 3.91 06/09/2020 0750   HGB 10.5 (L) 06/09/2020 0803   HCT 31.0 (L) 06/09/2020 0803   PLT 244 06/09/2020 0750   MCV 87.0 06/09/2020 0750   MCH 28.1 06/09/2020 0750   MCHC 32.4 06/09/2020 0750   RDW 12.3 06/09/2020 0750   LYMPHSABS 1.7 06/09/2020 0750   MONOABS 0.5 06/09/2020 0750   EOSABS 0.2 06/09/2020 0750   BASOSABS 0.0  06/09/2020 0750   CMP     Component Value Date/Time   NA 139 06/09/2020 0803   K 3.8 06/09/2020 0803   CL 102 06/09/2020 0803   CO2 24 06/09/2020 0750   GLUCOSE 157 (H) 06/09/2020 0803   BUN 42 (H) 06/09/2020 0803   CREATININE 4.30 (H) 06/09/2020 0803   CALCIUM 9.2 06/09/2020 0750   PROT 7.6 06/09/2020 0750   ALBUMIN 3.1 (L) 06/09/2020 0750   AST 17 06/09/2020 0750   ALT 10 06/09/2020 0750   ALKPHOS 67 06/09/2020 0750   BILITOT 0.4 06/09/2020 0750   GFRNONAA 12 (L) 06/09/2020 0750   I have reviewed the images obtained: CT head IMPRESSION: 1. No acute intracranial process. Supratentorial demyelinating lesions. 2. Chronic left internal capsule lacunar insult. 3. ASPECTS is 10  MRI brain without contrast, MR angio head without contrast: IMPRESSION: 1. Punctate focus of restricted diffusion  within the dorsal pons on the right side. This may represent an acute/subacute infarct versus active demyelination. 2. Scattered and confluent foci of T2 hyperintensity are seen within the white matter of the cerebral hemispheres and pons, nonspecific, consistent with the clinical diagnosis of multiple sclerosis with multiple new lesions. 3. Unremarkable MRA of the head without evidence of high-grade flow-limiting stenosis or proximal branch occlusion.  Assessment:  55 year old female with history of multiple strokes on home aspirin and clopidogrel and multiple stroke risk factors presents to the ED with concerns of visual changes, dysconjugate gaze, and new right-sided facial droop. On assessment, she was found to have an partial gaze palsy with direction changing nystagmus, dizziness, and nausea. A CT head was obtained with no acute intracranial process. CT angio contraindicated due to creatinine of 4.3, so she was taken emergently to MRI/MRA to evaluate for basilar artery occlusion versus potential recrudescence/pseudoflare.  This demonstrated restricted diffusion within the right dorsal pons indicating a possible acute/subacute infarct versus autoimmune process. Low suspicion for seizure activity at this time.   MRI brain with contrast imaging was obtained after discussion of risk/benefit with patient, family, neuroradiology, and nephrology.  All parties agreed that this was appropriate to determine whether simple stroke work-up versus potential immunosuppressive treatment was needed.  I personally reviewed the study, given no abnormal enhancement, will proceed with stroke work-up  Recommendations:  # Dorsal right pontine stroke - HgbA1c, fasting lipid panel - Frequent neuro checks - Echocardiogram - Carotid ultraound - MRI brain and MRA completed as above - Prophylactic therapy continue home clopidogrel and add aspirin 81 mg daily (patient reported she was not taking aspirin at home as this  had been stopped by another provider previously, despite being documented in her medications on her last visit with Dr. Leonie Man.  Additionally despite allergy to ibuprofen listed in her chart, she tolerated aspirin 325 mg administered in the ED) - Risk factor modification - Telemetry monitoring - PT consult, OT consult, Speech consult - Stroke team to follow  Anibal Henderson, AGAC-NP [facility] [phone]   Attending Neurologist's note:  I personally saw this patient, gathering history, performing a full neurologic examination, reviewing relevant labs, personally reviewing relevant imaging including head CT, MRI, MRA, and formulated the assessment and plan, adding the note above for completeness and clarity to accurately reflect my thoughts.  NP assisted as scribe  Given concern for potential life-threatening basilar occlusion requiring emergent evaluation, 105 minutes of critical care time were spent with this patient,

## 2020-06-09 NOTE — ED Notes (Signed)
Hannah [name] with [facility] to transport to the floor.

## 2020-06-09 NOTE — ED Notes (Signed)
Got patient straighten up in the bed changed placed a new brief on place a external cath patient is resting with call bell in reach and family at bedside

## 2020-06-09 NOTE — Code Documentation (Signed)
Patient from home with family and LSN at 2230 on 06/08/20. Patient with baseline right-sided weakness and slurred speech from a previous stroke. She uses a wheelchair and a walker with assist at home. Family noticed pt was having difficulty seeing this am and had worsening slurred speech and weakness. They questioned if her blood sugar was low overnight. The patient felt "the room was spinning." [facility] was called and a code stroke was activated. When assessing the pt they felt that she had new left-sided weakness. She also had abnormal eye movement (nystagmus). Pt does have a hx of CVA (on Plavix) with questionable seizure activity in the past, CKD, MS, HTN, HLD, and DM2.   Pt taken to [facility] and met by the stroke team and EDP. Labs were drawn and she was taken for CT. Because of her kidney function we were not able to do a CTA but took her for an MRI WO. On arrival to the ED pt had right-sided weakness rather than left. BP 208/102 and then 198/95. NIHSS 8 (see documentation) but many of the scores were from baseline symptoms. MRI results below. No acute treatment d/t being outside the window for TPA and no LVO seen on scans per MD. Care Plan: Q2x12 then Q4 vitals/neuro checks. Hand off with [place], Therapist, sports. Jasira Robinson, Rande Brunt, RN    "IMPRESSION: 1. Punctate focus of restricted diffusion within the dorsal pons on the right side. This may represent an acute/subacute infarct versus active demyelination. 2. Scattered and confluent foci of T2 hyperintensity are seen within the white matter of the cerebral hemispheres and pons, nonspecific, consistent with the clinical diagnosis of multiple sclerosis with multiple new lesions. 3. Unremarkable MRA of the head without evidence of high-grade flow-limiting stenosis or proximal branch occlusion."

## 2020-06-09 NOTE — ED Provider Notes (Signed)
[facility] EMERGENCY DEPARTMENT Provider Note   CSN: [id number] Arrival date & time: 06/09/20  0746     History No chief complaint on file.   Hannah [name] is a 55 y.o. female.  She has a history of MS and stroke affecting her right side.  Uses a walker or wheelchair.  Last known well 10:30 PM last night.  Awoke with new onset tremor and some left-sided weakness.  Facial droop.  Slower speech.  Patient tells me she has a headache and some pain in her right leg although the right leg sounds baseline.  The history is provided by the EMS personnel and the patient.  Cerebrovascular Accident This is a new problem. The current episode started 6 to 12 hours ago. The problem occurs constantly. The problem has not changed since onset.Associated symptoms include headaches. Pertinent negatives include no chest pain, no abdominal pain and no shortness of breath. Nothing aggravates the symptoms. Nothing relieves the symptoms. She has tried nothing for the symptoms. The treatment provided no relief.       Past Medical History:  Diagnosis Date  . Cerebral infarction ([place]) 07/12/2017   2009 first one, has had a total of 4 stokes  . Diabetes type 2, controlled ([place])   . Hemiparesis affecting right side as late effect of cerebrovascular accident (CVA) ([place])   . Hyperlipidemia   . Hypertension   . Multiple sclerosis ([place]) 2009  . Stroke [facility])     Patient Active Problem List   Diagnosis Date Noted  . Bimalleolar ankle fracture 11/24/2018    Past Surgical History:  Procedure Laterality Date  . APPENDECTOMY    . CYSTOSTOMY W/ BLADDER BIOPSY    . OVARIAN CYST SURGERY    . stent placed in heart       OB History   No obstetric history on file.     No family history on file.  Social History   Tobacco Use  . Smoking status: Never Smoker  . Smokeless tobacco: Never Used  Vaping Use  . Vaping Use: Never used  Substance Use Topics  . Alcohol use: Not Currently  .  Drug use: Not Currently    Home Medications Prior to Admission medications   Medication Sig Start Date End Date Taking? Authorizing Provider  amLODipine (NORVASC) 10 MG tablet Take 10 mg by mouth daily.    [provider]  aspirin 81 MG chewable tablet Chew by mouth.    [provider]  Blood Pressure Monitor DEVI 1 Units by Does not apply route 2 (two) times daily. 06/18/18   Tacy Learn, PA-C  clopidogrel (PLAVIX) 75 MG tablet  04/18/18   [provider]  famotidine (PEPCID) 40 MG tablet  05/25/18   [provider]  FLUoxetine (PROZAC) 20 MG capsule  02/27/18   [provider]  glipiZIDE (GLUCOTROL) 10 MG tablet TAKE 1 TABLET BY MOUTH TWICE DAILY BEFORE  MEALS. 08/26/17   [provider]  hydrochlorothiazide (HYDRODIURIL) 25 MG tablet  04/18/18   [provider]  HYDROcodone-acetaminophen (NORCO/VICODIN) 5-325 MG tablet Take 1 tablet by mouth every 8 (eight) hours as needed. 11/21/18   Couture, Cortni S, PA-C  HYDROcodone-acetaminophen (NORCO/VICODIN) 5-325 MG tablet Take 1 tablet by mouth every 12 (twelve) hours as needed for moderate pain. Ankle fracture pain. 12/19/18   Marybelle Killings, MD  Insulin Degludec (TRESIBA [place]) Inject 35-40 Units into the skin at bedtime.    [provider]  insulin lispro (  HUMALOG KWIKPEN) 100 UNIT/ML KwikPen Inject into the skin. 09/28/17 09/28/18  [provider]  lisinopril (PRINIVIL,ZESTRIL) 40 MG tablet Take by mouth. 01/18/18   [provider]  losartan (COZAAR) 50 MG tablet  01/17/19   [provider]  metoprolol tartrate (LOPRESSOR) 100 MG tablet Take 100 mg by mouth 2 (two) times daily.    [provider]  nitroGLYCERIN (NITROSTAT) 0.4 MG SL tablet DISSOLVE ONE TABLET UNDER THE TONGUE EVERY 5 MINUTES AS NEEDED FOR CHEST PAIN.  DO NOT EXCEED A TOTAL OF 3 DOSES IN 15 MINUTES 04/05/16   [provider]  ondansetron (ZOFRAN) 4 MG tablet Take 1 tablet (4 mg  total) by mouth every 8 (eight) hours as needed for nausea or vomiting. 11/29/18   Marybelle Killings, MD  rosuvastatin (CRESTOR) 20 MG tablet Take by mouth. 09/02/17   [provider]  topiramate (TOPAMAX) 25 MG tablet Take 1 tablet (25 mg total) by mouth 2 (two) times daily. 01/11/19   Garvin Fila, MD    Allergies    Ibuprofen, Influenza vaccines, Pneumococcal vaccine, Promethazine, Amlodipine, Cyclobenzaprine, and Oxycodone  Review of Systems   Review of Systems  Unable to perform ROS: Mental status change  Constitutional: Negative for fever.  HENT: Negative for sore throat.   Eyes: Negative for pain.  Respiratory: Negative for shortness of breath.   Cardiovascular: Negative for chest pain.  Gastrointestinal: Negative for abdominal pain.  Genitourinary: Negative for dysuria.  Musculoskeletal: Positive for gait problem.  Skin: Negative for rash.  Neurological: Positive for weakness and headaches.    Physical Exam Updated Vital Signs BP (!) 189/85   Pulse 78   Temp 98.1 F (36.7 C) (Oral)   Resp 13   SpO2 100%   Physical Exam Vitals and nursing note reviewed.  Constitutional:      General: She is not in acute distress.    Appearance: Normal appearance. She is well-developed and well-nourished.  HENT:     Head: Normocephalic and atraumatic.  Eyes:     Conjunctiva/sclera: Conjunctivae normal.  Cardiovascular:     Rate and Rhythm: Normal rate and regular rhythm.     Heart sounds: No murmur heard.   Pulmonary:     Effort: Pulmonary effort is normal. No respiratory distress.     Breath sounds: Normal breath sounds.  Abdominal:     Palpations: Abdomen is soft.     Tenderness: There is no abdominal tenderness.  Musculoskeletal:        General: No deformity, signs of injury or edema.     Cervical back: Neck supple.  Skin:    General: Skin is warm and dry.     Capillary Refill: Capillary refill takes less than 2 seconds.  Neurological:     Mental Status: She is  alert.     Comments: Patient is awake and alert.  She is oriented to time and place.  Has some baseline right-sided weakness and drift.  Abnormal eye movements and blinking.  Left-sided facial droop.  No significant left-sided weakness although EMS states this was worse on their arrival  Psychiatric:        Mood and Affect: Mood and affect normal.     ED Results / Procedures / Treatments   Labs (all labs ordered are listed, but only abnormal results are displayed) Labs Reviewed  CBC - Abnormal; Notable for the following components:      Result Value   Hemoglobin 11.0 (*)    HCT 34.0 (*)  All other components within normal limits  COMPREHENSIVE METABOLIC PANEL - Abnormal; Notable for the following components:   Glucose, Bld 161 (*)    BUN 39 (*)    Creatinine, Ser 4.11 (*)    Albumin 3.1 (*)    GFR, Estimated 12 (*)    All other components within normal limits  GLUCOSE, CAPILLARY - Abnormal; Notable for the following components:   Glucose-Capillary 196 (*)    All other components within normal limits  CBG MONITORING, ED - Abnormal; Notable for the following components:   Glucose-Capillary 147 (*)    All other components within normal limits  I-STAT CHEM 8, ED - Abnormal; Notable for the following components:   BUN 42 (*)    Creatinine, Ser 4.30 (*)    Glucose, Bld 157 (*)    Calcium, Ion 1.12 (*)    Hemoglobin 10.5 (*)    HCT 31.0 (*)    All other components within normal limits  CBG MONITORING, ED - Abnormal; Notable for the following components:   Glucose-Capillary 203 (*)    All other components within normal limits  SARS CORONAVIRUS 2 (TAT 6-24 HRS)  PROTIME-INR  APTT  DIFFERENTIAL  ETHANOL  RAPID URINE DRUG SCREEN, HOSP PERFORMED  TSH  HIV ANTIBODY (ROUTINE TESTING W REFLEX)  HEMOGLOBIN A1C  LIPID PANEL  I-STAT BETA HCG BLOOD, ED ([facility], [facility], [facility] ONLY)    EKG EKG Interpretation  Date/Time:  Monday June 09 2020 08:45:08 EST Ventricular Rate:  77 PR  Interval:    QRS Duration: 94 QT Interval:  406 QTC Calculation: 460 R Axis:   22 Text Interpretation: Sinus rhythm nonspecific st/ts No old tracing to compare Confirmed by Aletta Edouard [phone]) on 06/09/2020 8:51:47 AM   Radiology MR ANGIO HEAD WO CONTRAST  Result Date: 06/09/2020 CLINICAL DATA:  Neuro deficit, acute, stroke suspected. EXAM: MRI HEAD WITHOUT CONTRAST MRA HEAD WITHOUT CONTRAST TECHNIQUE: Multiplanar, multiecho pulse sequences of the brain and surrounding structures were obtained without intravenous contrast. Angiographic images of the head were obtained using MRA technique without contrast. COMPARISON:  Head CT June 09, 2020. MRI of the brain June 17, 2018. FINDINGS: MRI HEAD FINDINGS Brain: Punctate focus of restricted diffusion within the dorsal pons on the right side. This may represent an acute/subacute infarct versus active demyelination. Scattered and confluent foci of T2 hyperintensity are seen within the white matter of the cerebral hemispheres and within the pons, nonspecific, consistent with the clinical diagnosis of multiple sclerosis with multiple new lesions seen when compared to prior MRI. Foci of tissue loss within the bilateral lenticulocapsular region and within the pons with associated susceptibility artifact most likely represent remote lacunar infarcts. Vascular: Normal flow voids. Skull and upper cervical spine: Normal marrow signal. Sinuses/Orbits: Bilateral lens surgery. Mucous retention cyst in the left sphenoid sinus. MRA HEAD FINDINGS The visualized portions of the distal cervical and intracranial internal carotid arteries are widely patent with normal flow related enhancement. The bilateral anterior cerebral arteries and middle cerebral arteries are widely patent with antegrade flow without high-grade flow-limiting stenosis or proximal branch occlusion. No intracranial aneurysm within the anterior circulation. The vertebral arteries are widely patent with  antegrade flow. The posterior inferior cerebral arteries are normal. Vertebrobasilar junction and basilar artery are widely patent with antegrade flow without evidence of basilar stenosis or aneurysm. Posterior cerebral arteries are normal bilaterally. No intracranial aneurysm within the posterior circulation. IMPRESSION: 1. Punctate focus of restricted diffusion within the dorsal pons on the right side. This  may represent an acute/subacute infarct versus active demyelination. 2. Scattered and confluent foci of T2 hyperintensity are seen within the white matter of the cerebral hemispheres and pons, nonspecific, consistent with the clinical diagnosis of multiple sclerosis with multiple new lesions. 3. Unremarkable MRA of the head without evidence of high-grade flow-limiting stenosis or proximal branch occlusion. These results were discussed in person at the time of interpretation on 06/09/2020 at 9:35 am with Dr. Lesleigh Noe . Electronically Signed   By: Pedro Earls M.D.   On: 06/09/2020 09:36   MR BRAIN WO CONTRAST  Result Date: 06/09/2020 CLINICAL DATA:  Neuro deficit, acute, stroke suspected. EXAM: MRI HEAD WITHOUT CONTRAST MRA HEAD WITHOUT CONTRAST TECHNIQUE: Multiplanar, multiecho pulse sequences of the brain and surrounding structures were obtained without intravenous contrast. Angiographic images of the head were obtained using MRA technique without contrast. COMPARISON:  Head CT June 09, 2020. MRI of the brain June 17, 2018. FINDINGS: MRI HEAD FINDINGS Brain: Punctate focus of restricted diffusion within the dorsal pons on the right side. This may represent an acute/subacute infarct versus active demyelination. Scattered and confluent foci of T2 hyperintensity are seen within the white matter of the cerebral hemispheres and within the pons, nonspecific, consistent with the clinical diagnosis of multiple sclerosis with multiple new lesions seen when compared to prior MRI. Foci of  tissue loss within the bilateral lenticulocapsular region and within the pons with associated susceptibility artifact most likely represent remote lacunar infarcts. Vascular: Normal flow voids. Skull and upper cervical spine: Normal marrow signal. Sinuses/Orbits: Bilateral lens surgery. Mucous retention cyst in the left sphenoid sinus. MRA HEAD FINDINGS The visualized portions of the distal cervical and intracranial internal carotid arteries are widely patent with normal flow related enhancement. The bilateral anterior cerebral arteries and middle cerebral arteries are widely patent with antegrade flow without high-grade flow-limiting stenosis or proximal branch occlusion. No intracranial aneurysm within the anterior circulation. The vertebral arteries are widely patent with antegrade flow. The posterior inferior cerebral arteries are normal. Vertebrobasilar junction and basilar artery are widely patent with antegrade flow without evidence of basilar stenosis or aneurysm. Posterior cerebral arteries are normal bilaterally. No intracranial aneurysm within the posterior circulation. IMPRESSION: 1. Punctate focus of restricted diffusion within the dorsal pons on the right side. This may represent an acute/subacute infarct versus active demyelination. 2. Scattered and confluent foci of T2 hyperintensity are seen within the white matter of the cerebral hemispheres and pons, nonspecific, consistent with the clinical diagnosis of multiple sclerosis with multiple new lesions. 3. Unremarkable MRA of the head without evidence of high-grade flow-limiting stenosis or proximal branch occlusion. These results were discussed in person at the time of interpretation on 06/09/2020 at 9:35 am with Dr. Lesleigh Noe . Electronically Signed   By: Pedro Earls M.D.   On: 06/09/2020 09:36   CT HEAD CODE STROKE WO CONTRAST  Result Date: 06/09/2020 CLINICAL DATA:  Code stroke.  Neuro deficit, acute, stroke suspected  EXAM: CT HEAD WITHOUT CONTRAST TECHNIQUE: Contiguous axial images were obtained from the base of the skull through the vertex without intravenous contrast. COMPARISON:  06/17/2018 FINDINGS: Brain: No acute infarct or intracranial hemorrhage. No mass lesion. No midline shift, ventriculomegaly or extra-axial fluid collection. Scattered and confluent supratentorial white matter hypodensities likely reflect sequela of multiple sclerosis. Chronic lacunar insult involving the left internal capsule posterior limb. Vascular: No hyperdense vessel or unexpected calcification. Carotid siphon atherosclerotic calcifications. Skull: Negative for fracture or focal lesion. Sinuses/Orbits: No  acute orbital finding. Small sphenoid sinus mucous retention cyst. Other: None. ASPECTS [facility] Stroke Program Early CT Score) - Ganglionic level infarction (caudate, lentiform nuclei, internal capsule, insula, M1-M3 cortex): 7 - Supraganglionic infarction (M4-M6 cortex): 3 Total score (0-10 with 10 being normal): 10 IMPRESSION: 1. No acute intracranial process. Supratentorial demyelinating lesions. 2. Chronic left internal capsule lacunar insult. 3. ASPECTS is 10 Code stroke imaging results were communicated on 06/09/2020 at 8:09 am to provider Dr Curly Shores via secure text paging. Electronically Signed   By: Primitivo Gauze M.D.   On: 06/09/2020 08:11    Procedures .Critical Care Performed by: Hayden Rasmussen, MD Authorized by: Hayden Rasmussen, MD   Critical care provider statement:    Critical care time (minutes):  45   Critical care time was exclusive of:  Separately billable procedures and treating other patients   Critical care was necessary to treat or prevent imminent or life-threatening deterioration of the following conditions:  CNS failure or compromise   Critical care was time spent personally by me on the following activities:  Discussions with consultants, evaluation of patient's response to treatment, examination  of patient, ordering and performing treatments and interventions, ordering and review of laboratory studies, ordering and review of radiographic studies, pulse oximetry, re-evaluation of patient's condition, obtaining history from patient or surrogate, review of old charts and development of treatment plan with patient or surrogate   (including critical care time)  Medications Ordered in ED Medications  0.9 %  sodium chloride infusion ( Intravenous New Bag/Given 06/09/20 1616)  acetaminophen (TYLENOL) tablet 650 mg (has no administration in time range)    Or  acetaminophen (TYLENOL) 160 MG/5ML solution 650 mg (has no administration in time range)    Or  acetaminophen (TYLENOL) suppository 650 mg (has no administration in time range)  senna-docusate (Senokot-S) tablet 1 tablet (has no administration in time range)  insulin aspart (novoLOG) injection 0-15 Units (3 Units Subcutaneous Given 06/09/20 1641)  insulin aspart (novoLOG) injection 0-5 Units (has no administration in time range)  enoxaparin (LOVENOX) injection 30 mg (has no administration in time range)  aspirin EC tablet 81 mg (has no administration in time range)  clopidogrel (PLAVIX) tablet 75 mg (has no administration in time range)  FLUoxetine (PROZAC) capsule 20 mg (has no administration in time range)  rosuvastatin (CRESTOR) tablet 20 mg (has no administration in time range)  insulin glargine (LANTUS) injection 35 Units (has no administration in time range)   stroke: mapping our early stages of recovery book (1 each Does not apply Given 06/09/20 1523)  gadobutrol (GADAVIST) 1 MMOL/ML injection 3 mL (3 mLs Intravenous Contrast Given 06/09/20 1408)    ED Course  I have reviewed the triage vital signs and the nursing notes.  Pertinent labs & imaging results that were available during my care of the patient were reviewed by me and considered in my medical decision making (see chart for details).  Clinical Course as of 06/09/20 1022   Mon Jun 09, 2020  0809 Patient's work-up showing elevated creatinine of 4.3 on the i-STAT.  In Care Everywhere 2/21 patient was 2.8.  Hemoglobin stable [MB]  1015 Per neurology Dr. Curly Shores patient will need to be admitted for further work-up.  Discussed with [facility] hospitalist Dr. Lorin Mercy who will evaluate the patient for admission. [MB]    Clinical Course User Index [MB] Hayden Rasmussen, MD   MDM Rules/Calculators/A&P  This patient complains of altered mental status, strokelike symptoms; this involves an extensive number of treatment Options and is a complaint that carries with it a high risk of complications and Morbidity. The differential includes hypoglycemia, stroke, seizure, neurologic disorder, bleed, metabolic derangement  I ordered, reviewed and interpreted labs, which included CBC with normal white count, low but stable hemoglobin, chemistries with new AKI, alcohol level negative, Covid testing negative I ordered medication IV fluids I ordered imaging studies which included CT head, and MRA brain and I independently    visualized and interpreted imaging which showed punctate lesion question stroke versus demyelination Additional history obtained from EMS Previous records obtained and reviewed in epic prior admission for stroke I consulted neuro hospitalist Dr. Curly Shores and [facility] hospitalist Dr. Lorin Mercy and discussed lab and imaging findings  Critical Interventions: Code stroke activation requiring my presence to assess patient and and maintain airway and rapid assessment of patient's neurologic condition  After the interventions stated above, I reevaluated the patient and found patient to be improving from a neuro standpoint.  Will need admission for further work-up.  Meron Nohl was evaluated in Emergency Department on 06/09/2020 for the symptoms described in the history of present illness. She was evaluated in the context of the global COVID-19 pandemic, which  necessitated consideration that the patient might be at risk for infection with the SARS-CoV-2 virus that causes COVID-19. Institutional protocols and algorithms that pertain to the evaluation of patients at risk for COVID-19 are in a state of rapid change based on information released by regulatory bodies including the CDC and federal and state organizations. These policies and algorithms were followed during the patient's care in the ED.   Final Clinical Impression(s) / ED Diagnoses Final diagnoses:  Acute CVA (cerebrovascular accident) [facility])    Rx / [place] Orders ED Discharge Orders    None       Hayden Rasmussen, MD 06/09/20 1730

## 2020-06-09 NOTE — H&P (Signed)
History and Physical    Hannah [name] [id number] DOB: 10-19-1965 DOA: 06/09/2020  PCP: Vernie Shanks, MD Consultants:  Avernini - endocrinology; Rodarius Kichline - orthopedics; Leonie Man - neurology Patient coming from:  Home - lives with daughter; NOK:  Daughter, Milaya Novosel, [phone]   Chief Complaint: Stroke-like symptoms  HPI: Hannah [name] is a 55 y.o. female with medical history significant of CVA with resultant L hemiparesis; MS; HTN; HLD; and DM presenting with stroke-like symptoms.  She has prior h/o CVA with residual L-sided weakness; she does not usually have speech symptoms, facial droop, or exotropia.  She also has h/o MS - she was considering initiation of treatment prior to [place] and has not really left home since [place] started and so treatment has been delayed.  She awoke with new tremor of LUE, L facial droop, and slower/slurred speech.      ED Course: Needs neuro w/u - stroke vs. MS flare.  LKW 1030 pm, awoke with L-sided deficits, dysarthria, ?gaze preference.  New AKI - creatinine 2.8 -> 4.1.  CT and MRI/MRA done.  Neurology to see.  ?able to get contrast study.  Review of Systems: As per HPI; otherwise review of systems reviewed and negative.   Ambulatory Status:  Ambulates with walker or wheelchair  COVID Vaccine Status:  Complete  Past Medical History:  Diagnosis Date  . Cerebral infarction ([place]) 07/12/2017   2009 first one, has had a total of 4 stokes  . Diabetes type 2, controlled ([place])   . Hemiparesis affecting right side as late effect of cerebrovascular accident (CVA) ([place])   . Hyperlipidemia   . Hypertension   . Multiple sclerosis ([place]) 2009  . Stroke [facility])     Past Surgical History:  Procedure Laterality Date  . APPENDECTOMY    . CYSTOSTOMY W/ BLADDER BIOPSY    . OVARIAN CYST SURGERY    . stent placed in heart      Social History   Socioeconomic History  . Marital status: Divorced    Spouse name: Not on file  . Number of children: Not on file   . Years of education: Not on file  . Highest education level: Not on file  Occupational History  . Occupation: disabled  Tobacco Use  . Smoking status: Never Smoker  . Smokeless tobacco: Never Used  Vaping Use  . Vaping Use: Never used  Substance and Sexual Activity  . Alcohol use: Not Currently  . Drug use: Never  . Sexual activity: Not on file  Other Topics Concern  . Not on file  Social History Narrative  . Not on file   Social Determinants of Health   Financial Resource Strain: Not on file  Food Insecurity: Not on file  Transportation Needs: Not on file  Physical Activity: Not on file  Stress: Not on file  Social Connections: Not on file  Intimate Partner Violence: Not on file    Allergies  Allergen Reactions  . Ibuprofen Other (See Comments) and Swelling    Edema Swelling to hands, feet and face   . Influenza Vaccines Anaphylaxis    Swelling, fever  . Pneumococcal Vaccine Anaphylaxis    Swelling, fever Swelling, fever   . Promethazine Other (See Comments)    hallucinations hallucinations   . Cyclobenzaprine Other (See Comments)    hallucinations   . Oxycodone Nausea Only and Nausea And Vomiting    History reviewed. No pertinent family history.  Prior to Admission medications   Medication Sig Start Date End  Date Taking? Authorizing Provider  amLODipine (NORVASC) 10 MG tablet Take 10 mg by mouth daily.    [provider]  aspirin 81 MG chewable tablet Chew by mouth.    [provider]  Blood Pressure Monitor DEVI 1 Units by Does not apply route 2 (two) times daily. 06/18/18   Tacy Learn, PA-C  clopidogrel (PLAVIX) 75 MG tablet  04/18/18   [provider]  famotidine (PEPCID) 40 MG tablet  05/25/18   [provider]  FLUoxetine (PROZAC) 20 MG capsule  02/27/18   [provider]  glipiZIDE (GLUCOTROL) 10 MG tablet TAKE 1 TABLET BY MOUTH TWICE DAILY BEFORE  MEALS. 08/26/17   [provider]   hydrochlorothiazide (HYDRODIURIL) 25 MG tablet  04/18/18   [provider]  HYDROcodone-acetaminophen (NORCO/VICODIN) 5-325 MG tablet Take 1 tablet by mouth every 8 (eight) hours as needed. 11/21/18   Couture, Cortni S, PA-C  HYDROcodone-acetaminophen (NORCO/VICODIN) 5-325 MG tablet Take 1 tablet by mouth every 12 (twelve) hours as needed for moderate pain. Ankle fracture pain. 12/19/18   Marybelle Killings, MD  Insulin Degludec (TRESIBA ) Inject 35-40 Units into the skin at bedtime.    [provider]  insulin lispro (HUMALOG KWIKPEN) 100 UNIT/ML KwikPen Inject into the skin. 09/28/17 09/28/18  [provider]  lisinopril (PRINIVIL,ZESTRIL) 40 MG tablet Take by mouth. 01/18/18   [provider]  losartan (COZAAR) 50 MG tablet  01/17/19   [provider]  metoprolol tartrate (LOPRESSOR) 100 MG tablet Take 100 mg by mouth 2 (two) times daily.    [provider]  nitroGLYCERIN (NITROSTAT) 0.4 MG SL tablet DISSOLVE ONE TABLET UNDER THE TONGUE EVERY 5 MINUTES AS NEEDED FOR CHEST PAIN.  DO NOT EXCEED A TOTAL OF 3 DOSES IN 15 MINUTES 04/05/16   [provider]  ondansetron (ZOFRAN) 4 MG tablet Take 1 tablet (4 mg total) by mouth every 8 (eight) hours as needed for nausea or vomiting. 11/29/18   Marybelle Killings, MD  rosuvastatin (CRESTOR) 20 MG tablet Take by mouth. 09/02/17   [provider]  topiramate (TOPAMAX) 25 MG tablet Take 1 tablet (25 mg total) by mouth 2 (two) times daily. 01/11/19   Garvin Fila, MD    Physical Exam: Vitals:   06/09/20 1500 06/09/20 1515 06/09/20 1530 06/09/20 1619  BP: (!) 154/84 (!) 156/87 (!) 163/81 (!) 196/89  Pulse: 80 84 80 77  Resp: 10 (!) '24 14 16  '$ Temp:   98.6 F (37 C) 98.7 F (37.1 C)  TempSrc:   Oral Oral  SpO2: 99% 100% 98% 99%     . General:  Appears calm and comfortable and is in NAD, mildly anxious . Eyes:  L exotropia, normal lids, iris . ENT:  grossly normal hearing, lips & tongue,  mmm . Neck:  no LAD, masses or thyromegaly . Cardiovascular:  RRR, no m/r/g. No LE edema.  Marland Kitchen Respiratory:   CTA bilaterally with no wheezes/rales/rhonchi.  Normal respiratory effort. . Abdomen:  soft, NT, ND, NABS . Skin:  no rash or induration seen on limited exam . Musculoskeletal:  LUE 4/5 strength with somewhat preserved grip strength; RLE mild weakness compared to left . Psychiatric:  Blunted/mildly anxious mood and affect, speech slow with mild slurring but appropriate, AOx3 . Neurologic:  L facial droop, chronic L-sided decreased sensation    Radiological Exams on Admission: Independently reviewed - see discussion in A/P where applicable  MR ANGIO HEAD WO CONTRAST  Result Date: 06/09/2020 CLINICAL DATA:  Neuro deficit, acute, stroke suspected. EXAM: MRI HEAD WITHOUT CONTRAST MRA HEAD WITHOUT CONTRAST TECHNIQUE: Multiplanar, multiecho pulse sequences of the brain and surrounding structures were obtained without intravenous contrast. Angiographic images of the head were obtained using MRA technique without contrast. COMPARISON:  Head CT June 09, 2020. MRI of the brain June 17, 2018. FINDINGS: MRI HEAD FINDINGS Brain: Punctate focus of restricted diffusion within the dorsal pons on the right side. This may represent an acute/subacute infarct versus active demyelination. Scattered and confluent foci of T2 hyperintensity are seen within the white matter of the cerebral hemispheres and within the pons, nonspecific, consistent with the clinical diagnosis of multiple sclerosis with multiple new lesions seen when compared to prior MRI. Foci of tissue loss within the bilateral lenticulocapsular region and within the pons with associated susceptibility artifact most likely represent remote lacunar infarcts. Vascular: Normal flow voids. Skull and upper cervical spine: Normal marrow signal. Sinuses/Orbits: Bilateral lens surgery. Mucous retention cyst in the left sphenoid sinus. MRA HEAD FINDINGS  The visualized portions of the distal cervical and intracranial internal carotid arteries are widely patent with normal flow related enhancement. The bilateral anterior cerebral arteries and middle cerebral arteries are widely patent with antegrade flow without high-grade flow-limiting stenosis or proximal branch occlusion. No intracranial aneurysm within the anterior circulation. The vertebral arteries are widely patent with antegrade flow. The posterior inferior cerebral arteries are normal. Vertebrobasilar junction and basilar artery are widely patent with antegrade flow without evidence of basilar stenosis or aneurysm. Posterior cerebral arteries are normal bilaterally. No intracranial aneurysm within the posterior circulation. IMPRESSION: 1. Punctate focus of restricted diffusion within the dorsal pons on the right side. This may represent an acute/subacute infarct versus active demyelination. 2. Scattered and confluent foci of T2 hyperintensity are seen within the white matter of the cerebral hemispheres and pons, nonspecific, consistent with the clinical diagnosis of multiple sclerosis with multiple new lesions. 3. Unremarkable MRA of the head without evidence of high-grade flow-limiting stenosis or proximal branch occlusion. These results were discussed in person at the time of interpretation on 06/09/2020 at 9:35 am with Dr. Lesleigh Noe . Electronically Signed   By: Pedro Earls M.D.   On: 06/09/2020 09:36   MR BRAIN WO CONTRAST  Result Date: 06/09/2020 CLINICAL DATA:  Neuro deficit, acute, stroke suspected. EXAM: MRI HEAD WITHOUT CONTRAST MRA HEAD WITHOUT CONTRAST TECHNIQUE: Multiplanar, multiecho pulse sequences of the brain and surrounding structures were obtained without intravenous contrast. Angiographic images of the head were obtained using MRA technique without contrast. COMPARISON:  Head CT June 09, 2020. MRI of the brain June 17, 2018. FINDINGS: MRI HEAD FINDINGS  Brain: Punctate focus of restricted diffusion within the dorsal pons on the right side. This may represent an acute/subacute infarct versus active demyelination. Scattered and confluent foci of T2 hyperintensity are seen within the white matter of the cerebral hemispheres and within the pons, nonspecific, consistent with the clinical diagnosis of multiple sclerosis with multiple new lesions seen when compared to prior MRI. Foci of tissue loss within the bilateral lenticulocapsular region and within the pons with associated susceptibility artifact most likely represent remote lacunar infarcts. Vascular: Normal flow voids. Skull and upper cervical spine: Normal marrow signal. Sinuses/Orbits: Bilateral lens surgery. Mucous retention cyst in the left sphenoid sinus. MRA HEAD FINDINGS The visualized portions of the distal cervical and intracranial internal carotid arteries are widely patent with normal flow related enhancement. The bilateral anterior cerebral arteries and  middle cerebral arteries are widely patent with antegrade flow without high-grade flow-limiting stenosis or proximal branch occlusion. No intracranial aneurysm within the anterior circulation. The vertebral arteries are widely patent with antegrade flow. The posterior inferior cerebral arteries are normal. Vertebrobasilar junction and basilar artery are widely patent with antegrade flow without evidence of basilar stenosis or aneurysm. Posterior cerebral arteries are normal bilaterally. No intracranial aneurysm within the posterior circulation. IMPRESSION: 1. Punctate focus of restricted diffusion within the dorsal pons on the right side. This may represent an acute/subacute infarct versus active demyelination. 2. Scattered and confluent foci of T2 hyperintensity are seen within the white matter of the cerebral hemispheres and pons, nonspecific, consistent with the clinical diagnosis of multiple sclerosis with multiple new lesions. 3. Unremarkable MRA  of the head without evidence of high-grade flow-limiting stenosis or proximal branch occlusion. These results were discussed in person at the time of interpretation on 06/09/2020 at 9:35 am with Dr. Lesleigh Noe . Electronically Signed   By: Pedro Earls M.D.   On: 06/09/2020 09:36   MR BRAIN W CONTRAST  Result Date: 06/09/2020 CLINICAL DATA:  Brain mass or lesion. MRI of the brain earlier today showing focus of restricted diffusion within the pons concerning for infarct versus active demyelination. EXAM: MRI HEAD WITH CONTRAST TECHNIQUE: Multiplanar, multiecho pulse sequences of the brain and surrounding structures were obtained with intravenous contrast. CONTRAST:  80m GADAVIST GADOBUTROL 1 MMOL/ML IV SOLN COMPARISON:  MRI of the brain without contrast June 09, 2020. FINDINGS: Limited study with only T1 postcontrast images and T2 coronal. No focus of abnormal contrast enhancement associated with the T2 hyperintense white matter lesions or correlating with focus of restricted diffusion within the pons described on prior MRI to suggest active demyelination. Therefore, focus of restricted diffusion seen on prior MRI likely represents a small acute ischemic insult. IMPRESSION: No focus of abnormal contrast enhancement. Focus of restricted diffusion within the pons seen on prior MRI likely represents a small acute ischemic insult. Electronically Signed   By: KPedro EarlsM.D.   On: 06/09/2020 14:19   CT HEAD CODE STROKE WO CONTRAST  Result Date: 06/09/2020 CLINICAL DATA:  Code stroke.  Neuro deficit, acute, stroke suspected EXAM: CT HEAD WITHOUT CONTRAST TECHNIQUE: Contiguous axial images were obtained from the base of the skull through the vertex without intravenous contrast. COMPARISON:  06/17/2018 FINDINGS: Brain: No acute infarct or intracranial hemorrhage. No mass lesion. No midline shift, ventriculomegaly or extra-axial fluid collection. Scattered and confluent  supratentorial white matter hypodensities likely reflect sequela of multiple sclerosis. Chronic lacunar insult involving the left internal capsule posterior limb. Vascular: No hyperdense vessel or unexpected calcification. Carotid siphon atherosclerotic calcifications. Skull: Negative for fracture or focal lesion. Sinuses/Orbits: No acute orbital finding. Small sphenoid sinus mucous retention cyst. Other: None. ASPECTS ([facility]) - Ganglionic level infarction (caudate, lentiform nuclei, internal capsule, insula, M1-M3 cortex): 7 - Supraganglionic infarction (M4-M6 cortex): 3 Total score (0-10 with 10 being normal): 10 IMPRESSION: 1. No acute intracranial process. Supratentorial demyelinating lesions. 2. Chronic left internal capsule lacunar insult. 3. ASPECTS is 10 Code stroke imaging results were communicated on 06/09/2020 at 8:09 am to provider Dr BCurly Shoresvia secure text paging. Electronically Signed   By: CPrimitivo GauzeM.D.   On: 06/09/2020 08:11    EKG: Independently reviewed.  NSR with rate 77; nonspecific ST changes with no evidence of acute ischemia   Labs on Admission: I have personally reviewed the  available labs and imaging studies at the time of the admission.  Pertinent labs:   Glucose 11 BUN 39/Creatinine 4.11/GFR 12 Albumin 3.1 Hgb 11.0 INR 0.9 ETOH <10 HCG <5 COVID pending   Assessment/Plan Principal Problem:   Stroke-like symptoms Active Problems:   Multiple sclerosis (HCC)   Hypertension   Hyperlipidemia   Diabetes type 2, controlled ([place])   Acute kidney injury superimposed on CKD (HCC)   Stroke-like symptoms -Complicated patient from a neurological perspective due to h/o CVAs and also MS (untreated) -Presenting with acute on chronic stroke-like symptoms -Exam is not clearly consistent with a certain stroke distribution and so active MS appears more likely -However, MRI is indeterminate -With renal dysfunction, this is tricky -  eventually, discussion with neurology, patient, and family led to decision to proceed with contrast imaging since it is essential to identify etiology of symptoms -Aspirin has been given to reduce stroke mortality and decrease morbidity; will also continue Plavix -Will admit for further evaluation -Telemetry monitoring -Echo -Risk stratification with FLP, A1c; will also check TSH and UDS -Neurology consult -PT/OT/ST/Nutrition Consults  MS -Patient reports that she was diagnosed with MS prior to onset of COVID - records are not available at this time, she reports she was diagnosed prior to moving in with her daughter -Has not started treatment -Current symptoms CVA vs. MS but will await further testing (as per neurology)  AKI on CKD -Prior renal function in 06/2019 was 40/2.81/21 -Currently 39/4.11/12 -This may be progressive CKD or may be AKI on CKD -Regardless, proceeding with contrast imaging is deleterious but appears necessary to differentiate her neurologic issue; MRI with contrast is less concerning than CTA -Neurology and nephrology are discussing -Nephrology is also consulting -For now, I have ordered 100 cc/hr IVF -Recheck renal function tomorrow  HTN -Allow permissive HTN for now -Treat BP only if >220/120, and then with goal of 15% reduction -Hold Norvasc, HCTZ, Cozaar, and Lopressor and plan to restart in 48-72 hours   HLD -Check FLP -Continue Crestor 20 mg daily   DM -Check A1c -Hold home PO medication (Glucotrol) -Continue home medication Tyler Aas) - but not on sliding scale -Will order moderate-scale SSI      Note: This patient has been tested and is negative for the novel coronavirus COVID-19. She has been fully vaccinated against COVID-19.    DVT prophylaxis:  Lovenox  Code Status: Full - confirmed with patient Family Communication: None present; neurology spoke with family at the time of admission Disposition Plan:  The patient is from:  home  Anticipated d/c is to: home with [facility] services vs. CIR  Anticipated d/c date will depend on clinical response to treatment, but likely 2-3 days Patient is currently: acutely ill Consults called: Neurology; PT/OT/ST/Nutrition; TOC team; diabetes coordinator Admission status: Admit - It is my clinical opinion that admission to INPATIENT is reasonable and necessary because of the expectation that this patient will require hospital care that crosses at least 2 midnights to treat this condition based on the medical complexity of the problems presented.  Given the aforementioned information, the predictability of an adverse outcome is felt to be significant.      Karmen Bongo MD [facility]   How to contact the [facility] Attending or Consulting provider [place] or covering provider during after hours [place], for this patient?  1. Check the care team in [facility] and look for a) attending/consulting [facility] provider listed and b) the [facility] team listed 2. Log into [email or web address]  and use [place]'s universal password to access. If you do not have the password, please contact the hospital operator. 3. Locate the [facility] provider you are looking for under [facility] and page to a number that you can be directly reached. 4. If you still have difficulty reaching the provider, please page the [facility] (Director on Call) for the Hospitalists listed on amion for assistance.   06/09/2020, 4:56 PM

## 2020-06-09 NOTE — ED Notes (Signed)
ED TO INPATIENT HANDOFF REPORT  ED Nurse Name and Phone #: Kriste Basque  S Name/Age/Gender Hannah [name] 55 y.o. female Room/Bed: 045C/045C  Code Status   Code Status: Full Code  Home/SNF/Other Home Patient oriented to: self Is this baseline? Yes   Triage Complete: Triage complete  Chief Complaint Stroke-like symptoms [R29.90]  Triage Note Pt here from home called a code stroke by ems pt with left sided weakness according to them , right sided weakness from old strokes, pt with hx of stroke     Allergies Allergies  Allergen Reactions  . Ibuprofen Other (See Comments) and Swelling    Edema Swelling to hands, feet and face   . Influenza Vaccines Anaphylaxis    Swelling, fever  . Pneumococcal Vaccine Anaphylaxis    Swelling, fever Swelling, fever   . Promethazine Other (See Comments)    hallucinations hallucinations   . Cyclobenzaprine Other (See Comments)    hallucinations   . Oxycodone Nausea Only and Nausea And Vomiting    Level of Care/Admitting Diagnosis ED Disposition    ED Disposition Condition [facility] Area: [place] [[id number]]  Level of Care: Telemetry Medical [[id number]]  May admit patient to [name] or [facility] if equivalent level of care is available:: No  Covid Evaluation: Asymptomatic Screening Protocol (No Symptoms)  Diagnosis: Stroke-like symptoms [id number]  Admitting Physician: Karmen Bongo [[id number]]  Attending Physician: Karmen Bongo [[id number]]  Estimated length of stay: past midnight tomorrow  Certification:: I certify this patient will need inpatient services for at least 2 midnights       B Medical/Surgery History Past Medical History:  Diagnosis Date  . Cerebral infarction ([place]) 07/12/2017   2009 first one, has had a total of 4 stokes  . Diabetes type 2, controlled ([place])   . Hemiparesis affecting right side as late effect of cerebrovascular accident (CVA) ([place])   . Hyperlipidemia   .  Hypertension   . Multiple sclerosis ([place]) 2009  . Stroke [facility])    Past Surgical History:  Procedure Laterality Date  . APPENDECTOMY    . CYSTOSTOMY W/ BLADDER BIOPSY    . OVARIAN CYST SURGERY    . stent placed in heart       A IV Location/Drains/Wounds Patient Lines/Drains/Airways Status    Active Line/Drains/Airways    Name Placement date Placement time Site Days   Peripheral IV 06/09/20 Left Forearm 06/09/20  0854  Forearm  less than 1          Intake/Output Last 24 hours No intake or output data in the 24 hours ending 06/09/20 1530  Labs/Imaging Results for orders placed or performed during the hospital encounter of 06/09/20 (from the past 48 hour(s))  CBG monitoring, ED     Status: Abnormal   Collection Time: 06/09/20  7:48 AM  Result Value Ref Range   Glucose-Capillary 147 (H) 70 - 99 mg/dL    Comment: Glucose reference range applies only to samples taken after fasting for at least 8 hours.   Comment 1 Notify RN    Comment 2 Document in Chart   Protime-INR     Status: None   Collection Time: 06/09/20  7:50 AM  Result Value Ref Range   Prothrombin Time 11.7 11.4 - 15.2 seconds   INR 0.9 0.8 - 1.2    Comment: (NOTE) INR goal varies based on device and disease states. Performed at [facility], [place] [address], [place], [place] [address]  APTT     Status: None   Collection Time: 06/09/20  7:50 AM  Result Value Ref Range   aPTT 29 24 - 36 seconds    Comment: Performed at [place] [address] [place], [place] [address]  CBC     Status: Abnormal   Collection Time: 06/09/20  7:50 AM  Result Value Ref Range   WBC 6.3 4.0 - 10.5 K/uL   RBC 3.91 3.87 - 5.11 MIL/uL   Hemoglobin 11.0 (L) 12.0 - 15.0 g/dL   HCT 34.0 (L) 36.0 - 46.0 %   MCV 87.0 80.0 - 100.0 fL   MCH 28.1 26.0 - 34.0 pg   MCHC 32.4 30.0 - 36.0 g/dL   RDW 12.3 11.5 - 15.5 %   Platelets 244 150 - 400 K/uL   nRBC 0.0 0.0 - 0.2 %    Comment: Performed at [facility],  [place] [address] [place], [place] [address]  Differential     Status: None   Collection Time: 06/09/20  7:50 AM  Result Value Ref Range   Neutrophils Relative % 61 %   Neutro Abs 3.9 1.7 - 7.7 K/uL   Lymphocytes Relative 27 %   Lymphs Abs 1.7 0.7 - 4.0 K/uL   Monocytes Relative 8 %   Monocytes Absolute 0.5 0.1 - 1.0 K/uL   Eosinophils Relative 3 %   Eosinophils Absolute 0.2 0.0 - 0.5 K/uL   Basophils Relative 1 %   Basophils Absolute 0.0 0.0 - 0.1 K/uL   Immature Granulocytes 0 %   Abs Immature Granulocytes 0.01 0.00 - 0.07 K/uL    Comment: Performed at [facility], [place] [address], [place], [place] [address]  Comprehensive metabolic panel     Status: Abnormal   Collection Time: 06/09/20  7:50 AM  Result Value Ref Range   Sodium 138 135 - 145 mmol/L   Potassium 3.8 3.5 - 5.1 mmol/L   Chloride 103 98 - 111 mmol/L   CO2 24 22 - 32 mmol/L   Glucose, Bld 161 (H) 70 - 99 mg/dL    Comment: Glucose reference range applies only to samples taken after fasting for at least 8 hours.   BUN 39 (H) 6 - 20 mg/dL   Creatinine, Ser 4.11 (H) 0.44 - 1.00 mg/dL   Calcium 9.2 8.9 - 10.3 mg/dL   Total Protein 7.6 6.5 - 8.1 g/dL   Albumin 3.1 (L) 3.5 - 5.0 g/dL   AST 17 15 - 41 U/L   ALT 10 0 - 44 U/L   Alkaline Phosphatase 67 38 - 126 U/L   Total Bilirubin 0.4 0.3 - 1.2 mg/dL   GFR, Estimated 12 (L) >60 mL/min    Comment: (NOTE) Calculated using the CKD-EPI Creatinine Equation (2021)    Anion gap 11 5 - 15    Comment: Performed at [facility], [place] [address] [place], [place] [address]  Ethanol     Status: None   Collection Time: 06/09/20  7:50 AM  Result Value Ref Range   Alcohol, Ethyl (B) <10 <10 mg/dL    Comment: (NOTE) Lowest detectable limit for serum alcohol is 10 mg/dL.  For medical purposes only. Performed at [facility], [place] [address] [place],  [address]   I-Stat beta hCG blood, ED     Status: None   Collection Time: 06/09/20  8:02 AM  Result Value Ref  Range   I-stat hCG, quantitative <5.0 <5  mIU/mL   Comment 3            Comment:   GEST. AGE      CONC.  (mIU/mL)   <=1 WEEK        5 - 50     2 WEEKS       50 - 500     3 WEEKS       100 - 10,000     4 WEEKS     1,000 - 30,000        FEMALE AND NON-PREGNANT FEMALE:     LESS THAN 5 mIU/mL   I-stat chem 8, ED     Status: Abnormal   Collection Time: 06/09/20  8:03 AM  Result Value Ref Range   Sodium 139 135 - 145 mmol/L   Potassium 3.8 3.5 - 5.1 mmol/L   Chloride 102 98 - 111 mmol/L   BUN 42 (H) 6 - 20 mg/dL   Creatinine, Ser 4.30 (H) 0.44 - 1.00 mg/dL   Glucose, Bld 157 (H) 70 - 99 mg/dL    Comment: Glucose reference range applies only to samples taken after fasting for at least 8 hours.   Calcium, Ion 1.12 (L) 1.15 - 1.40 mmol/L   TCO2 26 22 - 32 mmol/L   Hemoglobin 10.5 (L) 12.0 - 15.0 g/dL   HCT 31.0 (L) 36.0 - 46.0 %  SARS CORONAVIRUS 2 (TAT 6-24 HRS) Nasopharyngeal Nasopharyngeal Swab     Status: None   Collection Time: 06/09/20  9:15 AM   Specimen: Nasopharyngeal Swab  Result Value Ref Range   SARS Coronavirus 2 NEGATIVE NEGATIVE    Comment: (NOTE) SARS-CoV-2 target nucleic acids are NOT DETECTED.  The SARS-CoV-2 RNA is generally detectable in upper and lower respiratory specimens during the acute phase of infection. Negative results do not preclude SARS-CoV-2 infection, do not rule out co-infections with other pathogens, and should not be used as the sole basis for treatment or other patient management decisions. Negative results must be combined with clinical observations, patient history, and epidemiological information. The expected result is Negative.  Fact Sheet for Patients: SugarRoll.be  Fact Sheet for Healthcare Providers: https://www.woods-mathews.com/  This test is not yet approved or cleared by the [place] FDA and  has been authorized for detection and/or diagnosis of SARS-CoV-2 by FDA under an Emergency  Use Authorization (EUA). This EUA will remain  in effect (meaning this test can be used) for the duration of the COVID-19 declaration under Se ction 564(b)(1) of the Act, 21 U.S.C. section 360bbb-3(b)(1), unless the authorization is terminated or revoked sooner.  Performed at [facility], [place] [address], [place], [place] [address]   CBG monitoring, ED     Status: Abnormal   Collection Time: 06/09/20 12:58 PM  Result Value Ref Range   Glucose-Capillary 203 (H) 70 - 99 mg/dL    Comment: Glucose reference range applies only to samples taken after fasting for at least 8 hours.   Comment 1 Notify RN    Comment 2 Document in Chart    MR ANGIO HEAD WO CONTRAST  Result Date: 06/09/2020 CLINICAL DATA:  Neuro deficit, acute, stroke suspected. EXAM: MRI HEAD WITHOUT CONTRAST MRA HEAD WITHOUT CONTRAST TECHNIQUE: Multiplanar, multiecho pulse sequences of the brain and surrounding structures were obtained without intravenous contrast. Angiographic images of the head were obtained using MRA technique without contrast. COMPARISON:  Head CT June 09, 2020. MRI of the brain June 17, 2018. FINDINGS: MRI HEAD FINDINGS Brain:  Punctate focus of restricted diffusion within the dorsal pons on the right side. This may represent an acute/subacute infarct versus active demyelination. Scattered and confluent foci of T2 hyperintensity are seen within the white matter of the cerebral hemispheres and within the pons, nonspecific, consistent with the clinical diagnosis of multiple sclerosis with multiple new lesions seen when compared to prior MRI. Foci of tissue loss within the bilateral lenticulocapsular region and within the pons with associated susceptibility artifact most likely represent remote lacunar infarcts. Vascular: Normal flow voids. Skull and upper cervical spine: Normal marrow signal. Sinuses/Orbits: Bilateral lens surgery. Mucous retention cyst in the left sphenoid sinus. MRA HEAD FINDINGS The  visualized portions of the distal cervical and intracranial internal carotid arteries are widely patent with normal flow related enhancement. The bilateral anterior cerebral arteries and middle cerebral arteries are widely patent with antegrade flow without high-grade flow-limiting stenosis or proximal branch occlusion. No intracranial aneurysm within the anterior circulation. The vertebral arteries are widely patent with antegrade flow. The posterior inferior cerebral arteries are normal. Vertebrobasilar junction and basilar artery are widely patent with antegrade flow without evidence of basilar stenosis or aneurysm. Posterior cerebral arteries are normal bilaterally. No intracranial aneurysm within the posterior circulation. IMPRESSION: 1. Punctate focus of restricted diffusion within the dorsal pons on the right side. This may represent an acute/subacute infarct versus active demyelination. 2. Scattered and confluent foci of T2 hyperintensity are seen within the white matter of the cerebral hemispheres and pons, nonspecific, consistent with the clinical diagnosis of multiple sclerosis with multiple new lesions. 3. Unremarkable MRA of the head without evidence of high-grade flow-limiting stenosis or proximal branch occlusion. These results were discussed in person at the time of interpretation on 06/09/2020 at 9:35 am with Dr. Lesleigh Noe . Electronically Signed   By: Pedro Earls M.D.   On: 06/09/2020 09:36   MR BRAIN WO CONTRAST  Result Date: 06/09/2020 CLINICAL DATA:  Neuro deficit, acute, stroke suspected. EXAM: MRI HEAD WITHOUT CONTRAST MRA HEAD WITHOUT CONTRAST TECHNIQUE: Multiplanar, multiecho pulse sequences of the brain and surrounding structures were obtained without intravenous contrast. Angiographic images of the head were obtained using MRA technique without contrast. COMPARISON:  Head CT June 09, 2020. MRI of the brain June 17, 2018. FINDINGS: MRI HEAD FINDINGS Brain:  Punctate focus of restricted diffusion within the dorsal pons on the right side. This may represent an acute/subacute infarct versus active demyelination. Scattered and confluent foci of T2 hyperintensity are seen within the white matter of the cerebral hemispheres and within the pons, nonspecific, consistent with the clinical diagnosis of multiple sclerosis with multiple new lesions seen when compared to prior MRI. Foci of tissue loss within the bilateral lenticulocapsular region and within the pons with associated susceptibility artifact most likely represent remote lacunar infarcts. Vascular: Normal flow voids. Skull and upper cervical spine: Normal marrow signal. Sinuses/Orbits: Bilateral lens surgery. Mucous retention cyst in the left sphenoid sinus. MRA HEAD FINDINGS The visualized portions of the distal cervical and intracranial internal carotid arteries are widely patent with normal flow related enhancement. The bilateral anterior cerebral arteries and middle cerebral arteries are widely patent with antegrade flow without high-grade flow-limiting stenosis or proximal branch occlusion. No intracranial aneurysm within the anterior circulation. The vertebral arteries are widely patent with antegrade flow. The posterior inferior cerebral arteries are normal. Vertebrobasilar junction and basilar artery are widely patent with antegrade flow without evidence of basilar stenosis or aneurysm. Posterior cerebral arteries are normal bilaterally. No intracranial aneurysm  within the posterior circulation. IMPRESSION: 1. Punctate focus of restricted diffusion within the dorsal pons on the right side. This may represent an acute/subacute infarct versus active demyelination. 2. Scattered and confluent foci of T2 hyperintensity are seen within the white matter of the cerebral hemispheres and pons, nonspecific, consistent with the clinical diagnosis of multiple sclerosis with multiple new lesions. 3. Unremarkable MRA of the  head without evidence of high-grade flow-limiting stenosis or proximal branch occlusion. These results were discussed in person at the time of interpretation on 06/09/2020 at 9:35 am with Dr. Lesleigh Noe . Electronically Signed   By: Pedro Earls M.D.   On: 06/09/2020 09:36   MR BRAIN W CONTRAST  Result Date: 06/09/2020 CLINICAL DATA:  Brain mass or lesion. MRI of the brain earlier today showing focus of restricted diffusion within the pons concerning for infarct versus active demyelination. EXAM: MRI HEAD WITH CONTRAST TECHNIQUE: Multiplanar, multiecho pulse sequences of the brain and surrounding structures were obtained with intravenous contrast. CONTRAST:  15m GADAVIST GADOBUTROL 1 MMOL/ML IV SOLN COMPARISON:  MRI of the brain without contrast June 09, 2020. FINDINGS: Limited study with only T1 postcontrast images and T2 coronal. No focus of abnormal contrast enhancement associated with the T2 hyperintense white matter lesions or correlating with focus of restricted diffusion within the pons described on prior MRI to suggest active demyelination. Therefore, focus of restricted diffusion seen on prior MRI likely represents a small acute ischemic insult. IMPRESSION: No focus of abnormal contrast enhancement. Focus of restricted diffusion within the pons seen on prior MRI likely represents a small acute ischemic insult. Electronically Signed   By: KPedro EarlsM.D.   On: 06/09/2020 14:19   CT HEAD CODE STROKE WO CONTRAST  Result Date: 06/09/2020 CLINICAL DATA:  Code stroke.  Neuro deficit, acute, stroke suspected EXAM: CT HEAD WITHOUT CONTRAST TECHNIQUE: Contiguous axial images were obtained from the base of the skull through the vertex without intravenous contrast. COMPARISON:  06/17/2018 FINDINGS: Brain: No acute infarct or intracranial hemorrhage. No mass lesion. No midline shift, ventriculomegaly or extra-axial fluid collection. Scattered and confluent  supratentorial white matter hypodensities likely reflect sequela of multiple sclerosis. Chronic lacunar insult involving the left internal capsule posterior limb. Vascular: No hyperdense vessel or unexpected calcification. Carotid siphon atherosclerotic calcifications. Skull: Negative for fracture or focal lesion. Sinuses/Orbits: No acute orbital finding. Small sphenoid sinus mucous retention cyst. Other: None. ASPECTS ([facility]) - Ganglionic level infarction (caudate, lentiform nuclei, internal capsule, insula, M1-M3 cortex): 7 - Supraganglionic infarction (M4-M6 cortex): 3 Total score (0-10 with 10 being normal): 10 IMPRESSION: 1. No acute intracranial process. Supratentorial demyelinating lesions. 2. Chronic left internal capsule lacunar insult. 3. ASPECTS is 10 Code stroke imaging results were communicated on 06/09/2020 at 8:09 am to provider Dr BCurly Shoresvia secure text paging. Electronically Signed   By: CPrimitivo GauzeM.D.   On: 06/09/2020 08:11    Pending Labs Unresulted Labs (From admission, onward)          Start     Ordered   06/10/20 0500  HIV Antibody (routine testing w rflx)  (HIV Antibody (Routine testing w reflex) panel)  Tomorrow morning,   R        06/09/20 1154   06/10/20 0500  Hemoglobin A1c  (Labs)  Tomorrow morning,   R        06/09/20 1154   06/10/20 0500  Lipid panel  (Labs)  Tomorrow morning,  R       Comments: Fasting    06/09/20 1154   06/09/20 1154  TSH  Once,   STAT        06/09/20 1154   06/09/20 0756  Rapid urine drug screen (hospital performed)  ONCE - STAT,   STAT        06/09/20 0756          Vitals/Pain Today's Vitals   06/09/20 1436 06/09/20 1445 06/09/20 1500 06/09/20 1520  BP: (!) 162/93 (!) 180/83 (!) 154/84   Pulse: 81 78 80   Resp: '16 18 10   '$ Temp:      TempSrc:      SpO2: 100% 100% 99%   PainSc:    7     Isolation Precautions No active isolations  Medications Medications  0.9 %  sodium chloride infusion  ( Intravenous New Bag/Given 06/09/20 1525)  acetaminophen (TYLENOL) tablet 650 mg (has no administration in time range)    Or  acetaminophen (TYLENOL) 160 MG/5ML solution 650 mg (has no administration in time range)    Or  acetaminophen (TYLENOL) suppository 650 mg (has no administration in time range)  senna-docusate (Senokot-S) tablet 1 tablet (has no administration in time range)  aspirin suppository 300 mg ( Rectal See Alternative 06/09/20 1524)    Or  aspirin tablet 325 mg (325 mg Oral Given 06/09/20 1524)  insulin aspart (novoLOG) injection 0-15 Units (5 Units Subcutaneous Given 06/09/20 1530)  insulin aspart (novoLOG) injection 0-5 Units (has no administration in time range)  enoxaparin (LOVENOX) injection 30 mg (has no administration in time range)   stroke: mapping our early stages of recovery book (1 each Does not apply Given 06/09/20 1523)  gadobutrol (GADAVIST) 1 MMOL/ML injection 3 mL (3 mLs Intravenous Contrast Given 06/09/20 1408)    Mobility walks with device High fall risk   Focused Assessments Neuro Assessment Handoff:  Swallow screen pass? Yes    NIH Stroke Scale ( + Modified Stroke Scale Criteria)  Interval: Initial Level of Consciousness (1a.)   : Alert, keenly responsive LOC Questions (1b. )   +: Answers both questions correctly LOC Commands (1c. )   + : Performs both tasks correctly Best Gaze (2. )  +: Partial gaze palsy Visual (3. )  +: No visual loss Facial Palsy (4. )    : Partial paralysis  Motor Arm, Left (5a. )   +: No drift Motor Arm, Right (5b. )   +: No drift Motor Leg, Left (6a. )   +: No drift Motor Leg, Right (6b. )   +: No drift Limb Ataxia (7. ): Present in two limbs Sensory (8. )   +: Normal, no sensory loss Best Language (9. )   +: Mild-to-moderate aphasia Dysarthria (10. ): Mild-to-moderate dysarthria, patient slurs at least some words and, at worst, can be understood with some difficulty Extinction/Inattention (11.)   +: No  Abnormality Modified SS Total  +: 2 Complete NIHSS TOTAL: 8 Last date known well: 06/08/20 Last time known well: 2230 Neuro Assessment:   Neuro Checks:   Initial (06/09/20 0800)  Last Documented NIHSS Modified Score: 2 (06/09/20 1521) Has TPA been given? No If patient is a Neuro Trauma and patient is going to OR before floor call report to [place] nurse: [phone] or [phone]     R Recommendations: See Admitting Provider Note  Report given to:   Additional Notes:

## 2020-06-09 NOTE — ED Triage Notes (Signed)
Pt here from home called a code stroke by ems pt with left sided weakness according to them , right sided weakness from old strokes, pt with hx of stroke

## 2020-06-09 NOTE — Progress Notes (Signed)
Physical Therapy Evaluation Patient Details Name: Hannah [name] MRN: [id number] DOB: 1966-04-01 Today's Date: 06/09/2020   History of Present Illness  55 yo female with onset of L hemiparesis on top of previous R side weakness was brought to hosp.  Pt had dizziness, elevation of BP and tremor of the LUE.  PMHx:  strokes, R hemiparesis, DM, MS, HTN, HLD, seizures, CKD,  Clinical Impression  Pt was seen for assessment of mobility and noted her struggle with motor planning, standing balance control and midline awareness.  Follow up with her to prepare for CIR admission, and work on safety and independence, and make use of family involvement for transition to home.    Follow Up Recommendations CIR    Equipment Recommendations  None recommended by PT    Recommendations for Other Services Rehab consult     Precautions / Restrictions Precautions Precautions: Fall Precaution Comments: monitor sats, BP and HR Restrictions Weight Bearing Restrictions: No      Mobility  Bed Mobility Overal bed mobility: Needs Assistance Bed Mobility: Supine to Sit;Sit to Supine     Supine to sit: Mod assist Sit to supine: Max assist        Transfers Overall transfer level: Needs assistance Equipment used: 4-wheeled walker Transfers: Sit to/from Stand Sit to Stand: Min assist            Ambulation/Gait         Gait velocity: reduced   General Gait Details: unable to walk  Stairs            Wheelchair Mobility    Modified Rankin (Stroke Patients Only)       Balance Overall balance assessment: Needs assistance Sitting-balance support: Feet supported Sitting balance-Leahy Scale: Fair     Standing balance support: Bilateral upper extremity supported;During functional activity Standing balance-Leahy Scale: Poor                               Pertinent Vitals/Pain Pain Assessment: No/denies pain    Home Living Family/patient expects to be discharged  to:: Private residence Living Arrangements: Children Available Help at Discharge: Family;Friend(s);Available 24 hours/day Type of Home: House Home Access: Stairs to enter       Home Equipment: [place] - 4 wheels;Cane - single point      Prior Function Level of Independence: Needs assistance   Gait / Transfers Assistance Needed: rollstor with assist  ADL's / Homemaking Assistance Needed: famly assist with self care        Hand Dominance   Dominant Hand: Right    Extremity/Trunk Assessment   Upper Extremity Assessment Upper Extremity Assessment: Generalized weakness    Lower Extremity Assessment Lower Extremity Assessment: Generalized weakness    Cervical / Trunk Assessment Cervical / Trunk Assessment: Normal  Communication   Communication: No difficulties  Cognition Arousal/Alertness: Lethargic Behavior During Therapy: Flat affect Overall Cognitive Status: Within Functional Limits for tasks assessed                                        General Comments General comments (skin integrity, edema, etc.): Pt is weaker and struggling to stand upand balance,    Exercises     Assessment/Plan    PT Assessment Patient needs continued PT services  PT Problem List Decreased strength;Decreased activity tolerance;Decreased balance;Decreased coordination;Decreased knowledge of use of DME  PT Treatment Interventions DME instruction;Gait training;Functional mobility training;Therapeutic activities;Therapeutic exercise;Balance training;Neuromuscular re-education;Patient/family education    PT Goals (Current goals can be found in the Care Plan section)  Acute Rehab PT Goals Patient Stated Goal: to get stronger and feel better PT Goal Formulation: With patient Time For Goal Achievement: 06/23/20 Potential to Achieve Goals: Good    Frequency Min 4X/week   Barriers to discharge Decreased caregiver support requires two person help for walking     Co-evaluation               AM-PAC PT "6 Clicks" Mobility  Outcome Measure Help needed turning from your back to your side while in a flat bed without using bedrails?: A Little Help needed moving from lying on your back to sitting on the side of a flat bed without using bedrails?: A Lot Help needed moving to and from a bed to a chair (including a wheelchair)?: A Lot Help needed standing up from a chair using your arms (e.g., wheelchair or bedside chair)?: A Lot Help needed to walk in hospital room?: A Lot Help needed climbing 3-5 steps with a railing? : Total 6 Click Score: 12    End of Session Equipment Utilized During Treatment: Gait belt;Oxygen Activity Tolerance: Patient tolerated treatment well;Treatment limited secondary to medical complications (Comment) Patient left: in bed;with family/visitor present Nurse Communication: Mobility status PT Visit Diagnosis: Unsteadiness on feet (R26.81);Muscle weakness (generalized) (M62.81);Difficulty in walking, not elsewhere classified (R26.2);Hemiplegia and hemiparesis Hemiplegia - Right/Left: Right Hemiplegia - dominant/non-dominant: Dominant Hemiplegia - caused by: Unspecified    Time: 1449-1520 PT Time Calculation (min) (ACUTE ONLY): 31 min   Charges:   PT Evaluation $PT Eval Moderate Complexity: 1 Mod PT Treatments $Therapeutic Activity: 8-22 mins       Ramond Dial 06/09/2020, 9:21 PM  Mee Hives, PT MS Acute Rehab Dept. Number: [place] [facility] [place]

## 2020-06-10 ENCOUNTER — Inpatient Hospital Stay (HOSPITAL_COMMUNITY): Payer: Medicare Other

## 2020-06-10 ENCOUNTER — Other Ambulatory Visit (HOSPITAL_COMMUNITY): Payer: Medicare Other

## 2020-06-10 DIAGNOSIS — I639 Cerebral infarction, unspecified: Secondary | ICD-10-CM

## 2020-06-10 DIAGNOSIS — R2990 Unspecified symptoms and signs involving the nervous system: Secondary | ICD-10-CM

## 2020-06-10 DIAGNOSIS — G35 Multiple sclerosis: Secondary | ICD-10-CM

## 2020-06-10 DIAGNOSIS — G8191 Hemiplegia, unspecified affecting right dominant side: Secondary | ICD-10-CM | POA: Diagnosis not present

## 2020-06-10 DIAGNOSIS — I6330 Cerebral infarction due to thrombosis of unspecified cerebral artery: Secondary | ICD-10-CM

## 2020-06-10 LAB — LIPID PANEL
Cholesterol: 133 mg/dL (ref 0–200)
HDL: 54 mg/dL (ref >40)
LDL Cholesterol: 56 mg/dL (ref 0–99)
Total CHOL/HDL Ratio: 2.5 RATIO
Triglycerides: 113 mg/dL (ref <150)
VLDL: 23 mg/dL (ref 0–40)

## 2020-06-10 LAB — HEMOGLOBIN A1C
Hgb A1c MFr Bld: 8.9 % — ABNORMAL HIGH (ref 4.8–5.6)
Mean Plasma Glucose: 208.73 mg/dL

## 2020-06-10 LAB — GLUCOSE, CAPILLARY
Glucose-Capillary: 82 mg/dL (ref 70–99)
Glucose-Capillary: 135 mg/dL — ABNORMAL HIGH (ref 70–99)
Glucose-Capillary: 177 mg/dL — ABNORMAL HIGH (ref 70–99)
Glucose-Capillary: 121 mg/dL — ABNORMAL HIGH (ref 70–99)

## 2020-06-10 LAB — HIV ANTIBODY (ROUTINE TESTING W REFLEX): HIV Screen 4th Generation wRfx: NONREACTIVE

## 2020-06-10 MED ORDER — AMLODIPINE BESYLATE 10 MG PO TABS
10.0000 mg | ORAL_TABLET | Freq: Every day | ORAL | Status: DC
  Administered 2020-06-10 – 2020-06-12 (×3): 10 mg via ORAL
  Filled 2020-06-10 (×3): qty 1

## 2020-06-10 MED ORDER — ENSURE ENLIVE PO LIQD
237.0000 mL | Freq: Two times a day (BID) | ORAL | Status: DC
  Administered 2020-06-10 – 2020-06-12 (×3): 237 mL via ORAL

## 2020-06-10 MED ORDER — FAMOTIDINE 20 MG PO TABS
40.0000 mg | ORAL_TABLET | Freq: Every day | ORAL | Status: DC
Start: 2020-06-10 — End: 2020-06-11
  Administered 2020-06-10 – 2020-06-11 (×2): 40 mg via ORAL
  Filled 2020-06-10 (×2): qty 2

## 2020-06-10 MED ORDER — NITROGLYCERIN 0.4 MG SL SUBL: 0.4000 mg | SUBLINGUAL_TABLET | SUBLINGUAL | Status: DC | PRN

## 2020-06-10 MED ORDER — TRAZODONE HCL 50 MG PO TABS
25.0000 mg | ORAL_TABLET | Freq: Every evening | ORAL | Status: DC | PRN
  Administered 2020-06-11: 25 mg via ORAL
  Filled 2020-06-10: qty 1

## 2020-06-10 MED ORDER — METOPROLOL TARTRATE 50 MG PO TABS
100.0000 mg | ORAL_TABLET | Freq: Two times a day (BID) | ORAL | Status: DC
  Administered 2020-06-10 – 2020-06-12 (×5): 100 mg via ORAL
  Filled 2020-06-10 (×5): qty 2

## 2020-06-10 NOTE — Progress Notes (Signed)
Carotid artery duplex has been completed. Preliminary results can be found in CV Proc through chart review.   06/10/20 1:28 PM Hannah [name] RVT

## 2020-06-10 NOTE — Evaluation (Signed)
Occupational Therapy Evaluation Patient Details Name: Hannah [name] MRN: [id number] DOB: Feb 15, 1966 Today's Date: 06/10/2020    History of Present Illness 55 yo female with onset of L hemiparesis on top of previous R side weakness was brought to hosp.  Pt had dizziness, elevation of BP and tremor of the LUE.  PMHx:  strokes, R hemiparesis, DM, MS, HTN, HLD, seizures, CKD,   Clinical Impression   Pt PTA: pt living with daughter at home and reports requires some assist with ADL and iADLs provided for pt. Pt currently limited by decreased strength, decreased ability to care for self and decreased activity tolerance. appears to have depth perception deficits. Pt with double vision in R eye and  L eye appears to abduct and R eye with decreased smoothness with scanning. Pt taped L nasal portion on glasses. Pt given double vision handout. Pt modA overall for ADL and mobility. Pt would greatly benefit from continued OT skilled services for ADL, vision and mobility. OT following acutely.    Follow Up Recommendations  CIR    Equipment Recommendations  3 in 1 bedside commode    Recommendations for Other Services Rehab consult     Precautions / Restrictions Precautions Precautions: Fall Precaution Comments: monitor sats, BP and HR Restrictions Weight Bearing Restrictions: No      Mobility Bed Mobility Overal bed mobility: Needs Assistance Bed Mobility: Supine to Sit;Sit to Supine     Supine to sit: Mod assist Sit to supine: Max assist        Transfers Overall transfer level: Needs assistance Equipment used: 4-wheeled walker Transfers: Sit to/from [facility] Sit to Stand: Min assist Stand pivot transfers: Mod assist       General transfer comment: ModA with RW for stability and taking steps    Balance Overall balance assessment: Needs assistance Sitting-balance support: Feet supported Sitting balance-Leahy Scale: Fair     Standing balance support:  Bilateral upper extremity supported;During functional activity Standing balance-Leahy Scale: Poor                             ADL either performed or assessed with clinical judgement   ADL Overall ADL's : Needs assistance/impaired Eating/Feeding: Set up;Sitting   Grooming: Set up;Sitting   Upper Body Bathing: Minimal assistance;Sitting   Lower Body Bathing: Moderate assistance;Sitting/lateral leans;Sit to/from stand   Upper Body Dressing : Minimal assistance;Sitting   Lower Body Dressing: Moderate assistance;Sitting/lateral leans;Sit to/from stand   Toilet Transfer: Moderate assistance;RW;Stand-pivot   Toileting- Clothing Manipulation and Hygiene: Moderate assistance;Sit to/from stand;Sitting/lateral lean       Functional mobility during ADLs: Moderate assistance;Rolling walker;Cueing for safety General ADL Comments: Pt limited by decreased strength, decreased ability to care for self and decreased activity tolerance.     Vision Baseline Vision/History: Wears glasses Wears Glasses: Reading only Patient Visual Report: Diplopia;Blurring of vision;Other (comment) (appears to have depth perception deficits) Vision Assessment?: Yes Eye Alignment: Impaired (comment) Ocular Range of Motion: Other (comment);Impaired-to be further tested in functional context (restricted R eye moving to L) Tracking/Visual Pursuits: Impaired - to be further tested in functional context Additional Comments: Pt with double vision in R eye and  L eye appears to abduct and R eye with decreased smoothness with scanning.     Perception     Praxis      Pertinent Vitals/Pain Pain Assessment: No/denies pain     Hand Dominance Right   Extremity/Trunk Assessment Upper Extremity Assessment Upper  Extremity Assessment: Generalized weakness;RUE deficits/detail;LUE deficits/detail RUE Deficits / Details: 3+/5 MM grade shoulder through digits LUE Deficits / Details: 4-/5 MM grade shoulder  through digits       Cervical / Trunk Assessment Cervical / Trunk Assessment: Normal   Communication Communication Communication: No difficulties   Cognition Arousal/Alertness: Lethargic Behavior During Therapy: Flat affect Overall Cognitive Status: Within Functional Limits for tasks assessed                                     General Comments  daughter in room; handout provided for diplopia glasses trick; taped L eye    Exercises Exercises: Other exercises Other Exercises Other Exercises: taped R nasal portion of glasses   Shoulder Instructions      Home Living Family/patient expects to be discharged to:: Private residence Living Arrangements: Children Available Help at Discharge: Family;Friend(s);Available 24 hours/day Type of Home: House Home Access: Stairs to enter                     Home Equipment: [place] - 4 wheels;Cane - single point          Prior Functioning/Environment Level of Independence: Needs assistance  Gait / Transfers Assistance Needed: rollstor with assist ADL's / Homemaking Assistance Needed: famly assist with self care            OT Problem List: Decreased strength;Decreased activity tolerance;Impaired balance (sitting and/or standing);Decreased coordination;Impaired vision/perception;Decreased safety awareness;Impaired UE functional use      OT Treatment/Interventions: Self-care/ADL training;Therapeutic exercise;Neuromuscular education;DME and/or AE instruction;Therapeutic activities;Energy conservation;Patient/family education;Balance training;Visual/perceptual remediation/compensation    OT Goals(Current goals can be found in the care plan section) Acute Rehab OT Goals Patient Stated Goal: to get stronger and feel better OT Goal Formulation: With patient Time For Goal Achievement: 06/24/20 Potential to Achieve Goals: Good ADL Goals Pt Will Perform Grooming: with set-up;standing Pt Will Perform Lower Body  Dressing: with min guard assist;sit to/from stand Pt Will Transfer to Toilet: with min guard assist;ambulating;bedside commode Pt Will Perform Toileting - Clothing Manipulation and hygiene: with min guard assist;sitting/lateral leans;sit to/from stand Pt/caregiver will Perform Home Exercise Program: Increased strength;Both right and left upper extremity;With Supervision;With theraband Additional ADL Goal #1: Pt will participate in visual scanning and visual perceptual exercises in order to increase visual acuity.  OT Frequency: Min 2X/week   Barriers to D/C:            Co-evaluation              AM-PAC OT "6 Clicks" Daily Activity     Outcome Measure Help from another person eating meals?: A Little Help from another person taking care of personal grooming?: A Little Help from another person toileting, which includes using toliet, bedpan, or urinal?: A Lot Help from another person bathing (including washing, rinsing, drying)?: A Lot Help from another person to put on and taking off regular upper body clothing?: A Little Help from another person to put on and taking off regular lower body clothing?: A Lot 6 Click Score: 15   End of Session Equipment Utilized During Treatment: Gait belt;Rolling walker Nurse Communication: Mobility status  Activity Tolerance: Patient tolerated treatment well Patient left: in chair;with call bell/phone within reach;with chair alarm set;with family/visitor present  OT Visit Diagnosis: Unsteadiness on feet (R26.81);Muscle weakness (generalized) (M62.81);Low vision, both eyes (H54.2);Hemiplegia and hemiparesis Hemiplegia - Right/Left: Right Hemiplegia - dominant/non-dominant: Dominant Hemiplegia -  caused by: Cerebral infarction                Time: [id number] OT Time Calculation (min): 37 min Charges:  OT General Charges $OT Visit: 1 Visit OT Evaluation $OT Eval Moderate Complexity: 1 Mod OT Treatments $Neuromuscular Re-education: 8-22  mins Jefferey Pica, OTR/L Acute Rehabilitation Services Pager: [phone] Office: [phone]    Naelle Diegel C 06/10/2020, 6:07 PM

## 2020-06-10 NOTE — Progress Notes (Addendum)
Inpatient Diabetes Program Recommendations  AACE/ADA: New Consensus Statement on Inpatient Glycemic Control (2015)  Target Ranges:  Prepandial:   less than 140 mg/dL      Peak postprandial:   less than 180 mg/dL (1-2 hours)      Critically ill patients:  140 - 180 mg/dL   Lab Results  Component Value Date   GLUCAP 135 (H) 06/10/2020   HGBA1C 8.9 (H) 06/10/2020    Review of Glycemic Control Results for MARLIS, RUDDY (MRN [id number]) as of 06/10/2020 14:21  Ref. Range 06/09/2020 21:01 06/10/2020 06:08 06/10/2020 11:28  Glucose-Capillary Latest Ref Range: 70 - 99 mg/dL 102 (H) 82 135 (H)   Diabetes history: DM 2 Outpatient Diabetes medications:  Glucotrol 10 mg daily, Tresiba 35 units q HS, Humalog 6 units tid with meals Current orders for Inpatient glycemic control:  Novolog moderate tid with meals and HS Lantus 35 units q HS Inpatient Diabetes Program Recommendations:   Referral received.  Based on A1C, it appears that patient needs adjustment in home medications.   Thanks,  Adah Perl, RN, BC-ADM Inpatient Diabetes Coordinator Pager [phone] (8a-5p)

## 2020-06-10 NOTE — Consult Note (Signed)
Physical Medicine and Rehabilitation Consult Reason for Consult: Left side weakness Referring Physician: [facility]   HPI: Hannah [name] is a 55 y.o. right-handed female with history of hyperlipidemia, CKD stage IV, multiple sclerosis, diabetes mellitus, hypertension, , multiple CVA with right-sided residual weakness and , slurred speech maintained on aspirin and Plavix, patient has refused and declined multiple medical follow-up appointments..  Per chart review patient lives with her family.  She has assistance as needed.  Used a Rollator prior to admission.  Family assist with self-care.  Presented 06/09/2020 with dizziness and vision changes as well as left side weakness.  Blood pressure 208/102.  Cranial CT scan negative for acute changes.  MRI showed punctate focus of restricted diffusion within the dorsal pons on the right side.  Scattered and confluent foci of T2 hyperintensity also seen within the white matter of the cerebral hemispheres and pons, nonspecific consistent with clinical diagnosis of multiple sclerosis with multiple new lesions.  MRA unremarkable.  Admission chemistries glucose 161, BUN 39, creatinine 4.11 creatinine from February 2021 was 2.81, alcohol negative.  Patient did not receive TPA.  Currently remains on aspirin and Plavix for CVA prophylaxis.  Subcutaneous Lovenox for DVT prophylaxis.  Therapy evaluation completed with recommendations of physical medicine rehab consult due to left-sided weakness and facial droop with slurred speech.   Review of Systems  Constitutional: Negative for chills and fever.  HENT: Negative for hearing loss.   Eyes: Positive for blurred vision. Negative for double vision.  Respiratory: Negative for cough and shortness of breath.   Cardiovascular: Negative for chest pain, palpitations and leg swelling.  Gastrointestinal: Positive for constipation. Negative for heartburn, nausea and vomiting.  Genitourinary: Negative for dysuria, flank pain and  hematuria.  Musculoskeletal: Positive for joint pain and myalgias.  Skin: Negative for rash.  Neurological: Positive for dizziness, speech change and weakness.  All other systems reviewed and are negative.  Past Medical History:  Diagnosis Date  . Cerebral infarction ([place]) 07/12/2017   2009 first one, has had a total of 4 stokes  . Diabetes type 2, controlled ([place])   . Hemiparesis affecting right side as late effect of cerebrovascular accident (CVA) ([place])   . Hyperlipidemia   . Hypertension   . Multiple sclerosis ([place]) 2009  . Stroke [facility])    Past Surgical History:  Procedure Laterality Date  . APPENDECTOMY    . CYSTOSTOMY W/ BLADDER BIOPSY    . OVARIAN CYST SURGERY    . stent placed in heart     History reviewed. No pertinent family history. Social History:  reports that she has never smoked. She has never used smokeless tobacco. She reports previous alcohol use. She reports that she does not use drugs. Allergies:  Allergies  Allergen Reactions  . Ibuprofen Other (See Comments) and Swelling    Edema Swelling to hands, feet and face   . Influenza Vaccines Anaphylaxis    Swelling, fever  . Pneumococcal Vaccine Anaphylaxis    Swelling, fever Swelling, fever   . Promethazine Other (See Comments)    hallucinations hallucinations   . Cyclobenzaprine Other (See Comments)    hallucinations   . Oxycodone Nausea Only and Nausea And Vomiting   Medications Prior to Admission  Medication Sig Dispense Refill  . amLODipine (NORVASC) 10 MG tablet Take 10 mg by mouth daily.    . Blood Pressure Monitor DEVI 1 Units by Does not apply route 2 (two) times daily. 1 Device 0  . clopidogrel (PLAVIX)  75 MG tablet Take 75 mg by mouth daily.    . famotidine (PEPCID) 40 MG tablet Take 40 mg by mouth daily.    Marland Kitchen FLUoxetine (PROZAC) 20 MG capsule     . glipiZIDE (GLUCOTROL) 10 MG tablet Take 10 mg by mouth daily.    . hydrochlorothiazide (HYDRODIURIL) 25 MG tablet Take 25 mg by mouth  daily.    . Insulin Degludec (TRESIBA [place]) Inject 35-40 Units into the skin at bedtime. Per sliding scale    . Insulin Lispro (HUMALOG KWIKPEN [place]) Inject 6 Units into the skin in the morning, at noon, and at bedtime.    Marland Kitchen losartan (COZAAR) 50 MG tablet Take 50 mg by mouth daily.    . metoprolol tartrate (LOPRESSOR) 100 MG tablet Take 100 mg by mouth 2 (two) times daily.    . nitroGLYCERIN (NITROSTAT) 0.4 MG SL tablet Place 0.4 mg under the tongue every 5 (five) minutes as needed for chest pain.    . rosuvastatin (CRESTOR) 20 MG tablet Take 20 mg by mouth daily.    . insulin lispro (HUMALOG KWIKPEN) 100 UNIT/ML KwikPen Inject into the skin.      Home: Home Living Family/patient expects to be discharged to:: Private residence Living Arrangements: Children Available Help at Discharge: Family,Friend(s),Available 24 hours/day Type of Home: House Home Access: Stairs to enter Home Equipment: Walker - 4 wheels,Cane - single point  Functional History: Prior Function Level of Independence: Needs assistance Gait / Transfers Assistance Needed: rollstor with assist ADL's / Homemaking Assistance Needed: famly assist with self care Functional Status:  Mobility: Bed Mobility Overal bed mobility: Needs Assistance Bed Mobility: Supine to Sit,Sit to Supine Supine to sit: Mod assist Sit to supine: Max assist Transfers Overall transfer level: Needs assistance Equipment used: 4-wheeled walker Transfers: Sit to/from Stand Sit to Stand: Min assist Ambulation/Gait General Gait Details: unable to walk Gait velocity: reduced    ADL:    Cognition: Cognition Overall Cognitive Status: Within Functional Limits for tasks assessed Orientation Level: Oriented X4 Cognition Arousal/Alertness: Lethargic Behavior During Therapy: Flat affect Overall Cognitive Status: Within Functional Limits for tasks assessed  Blood pressure (!) 178/85, pulse 87, temperature 98.8 F (37.1 C), temperature source Oral,  resp. rate 16, SpO2 99 %. Physical Exam Constitutional:      Appearance: She is obese.  HENT:     Head: Normocephalic.     Right Ear: External ear normal.     Left Ear: External ear normal.     Nose: Nose normal.     Mouth/Throat:     Pharynx: No oropharyngeal exudate or posterior oropharyngeal erythema.  Eyes:     Pupils: Pupils are equal, round, and reactive to light.  Cardiovascular:     Rate and Rhythm: Normal rate and regular rhythm.     Heart sounds: No murmur heard. No gallop.   Pulmonary:     Effort: Pulmonary effort is normal. No respiratory distress.     Breath sounds: No wheezing or rales.  Abdominal:     General: Bowel sounds are normal. There is no distension.     Palpations: Abdomen is soft.  Musculoskeletal:        General: No tenderness or deformity.     Cervical back: Normal range of motion.  Skin:    General: Skin is warm and dry.  Neurological:     Comments: Patient is alert.  Speech is a bit dysarthric but intelligible.  Provides her name and age with some delay in processing.  Provides basic biographical information but limited insight and awareness.  Follows simple commands. Lacks adduction of the right eye. Has right facial droop. RUE 3 to 3+/5. RLE 2 to 2+/5 with apparent motor planning issues. Right leg almost rigid without any obvious clonus or significant increase in DTR's. LUE and LLE grossly 4+ to 5/5. Marland Kitchen   Psychiatric:        Mood and Affect: Mood normal.        Behavior: Behavior normal.     Results for orders placed or performed during the hospital encounter of 06/09/20 (from the past 24 hour(s))  CBG monitoring, ED     Status: Abnormal   Collection Time: 06/09/20 12:58 PM  Result Value Ref Range   Glucose-Capillary 203 (H) 70 - 99 mg/dL   Comment 1 Notify RN    Comment 2 Document in Chart   Glucose, capillary     Status: Abnormal   Collection Time: 06/09/20  4:32 PM  Result Value Ref Range   Glucose-Capillary 196 (H) 70 - 99 mg/dL  TSH      Status: None   Collection Time: 06/09/20  4:35 PM  Result Value Ref Range   TSH 0.623 0.350 - 4.500 uIU/mL  Glucose, capillary     Status: Abnormal   Collection Time: 06/09/20  9:01 PM  Result Value Ref Range   Glucose-Capillary 102 (H) 70 - 99 mg/dL  Hemoglobin A1c     Status: Abnormal   Collection Time: 06/10/20  5:43 AM  Result Value Ref Range   Hgb A1c MFr Bld 8.9 (H) 4.8 - 5.6 %   Mean Plasma Glucose 208.73 mg/dL  Lipid panel     Status: None   Collection Time: 06/10/20  5:43 AM  Result Value Ref Range   Cholesterol 133 0 - 200 mg/dL   Triglycerides 113 <150 mg/dL   HDL 54 >40 mg/dL   Total CHOL/HDL Ratio 2.5 RATIO   VLDL 23 0 - 40 mg/dL   LDL Cholesterol 56 0 - 99 mg/dL  Glucose, capillary     Status: None   Collection Time: 06/10/20  6:08 AM  Result Value Ref Range   Glucose-Capillary 82 70 - 99 mg/dL   MR ANGIO HEAD WO CONTRAST  Result Date: 06/09/2020 CLINICAL DATA:  Neuro deficit, acute, stroke suspected. EXAM: MRI HEAD WITHOUT CONTRAST MRA HEAD WITHOUT CONTRAST TECHNIQUE: Multiplanar, multiecho pulse sequences of the brain and surrounding structures were obtained without intravenous contrast. Angiographic images of the head were obtained using MRA technique without contrast. COMPARISON:  Head CT June 09, 2020. MRI of the brain June 17, 2018. FINDINGS: MRI HEAD FINDINGS Brain: Punctate focus of restricted diffusion within the dorsal pons on the right side. This may represent an acute/subacute infarct versus active demyelination. Scattered and confluent foci of T2 hyperintensity are seen within the white matter of the cerebral hemispheres and within the pons, nonspecific, consistent with the clinical diagnosis of multiple sclerosis with multiple new lesions seen when compared to prior MRI. Foci of tissue loss within the bilateral lenticulocapsular region and within the pons with associated susceptibility artifact most likely represent remote lacunar infarcts. Vascular:  Normal flow voids. Skull and upper cervical spine: Normal marrow signal. Sinuses/Orbits: Bilateral lens surgery. Mucous retention cyst in the left sphenoid sinus. MRA HEAD FINDINGS The visualized portions of the distal cervical and intracranial internal carotid arteries are widely patent with normal flow related enhancement. The bilateral anterior cerebral arteries and middle cerebral arteries are widely patent with antegrade  flow without high-grade flow-limiting stenosis or proximal branch occlusion. No intracranial aneurysm within the anterior circulation. The vertebral arteries are widely patent with antegrade flow. The posterior inferior cerebral arteries are normal. Vertebrobasilar junction and basilar artery are widely patent with antegrade flow without evidence of basilar stenosis or aneurysm. Posterior cerebral arteries are normal bilaterally. No intracranial aneurysm within the posterior circulation. IMPRESSION: 1. Punctate focus of restricted diffusion within the dorsal pons on the right side. This may represent an acute/subacute infarct versus active demyelination. 2. Scattered and confluent foci of T2 hyperintensity are seen within the white matter of the cerebral hemispheres and pons, nonspecific, consistent with the clinical diagnosis of multiple sclerosis with multiple new lesions. 3. Unremarkable MRA of the head without evidence of high-grade flow-limiting stenosis or proximal branch occlusion. These results were discussed in person at the time of interpretation on 06/09/2020 at 9:35 am with Dr. Lesleigh Noe . Electronically Signed   By: Pedro Earls M.D.   On: 06/09/2020 09:36   MR BRAIN WO CONTRAST  Result Date: 06/09/2020 CLINICAL DATA:  Neuro deficit, acute, stroke suspected. EXAM: MRI HEAD WITHOUT CONTRAST MRA HEAD WITHOUT CONTRAST TECHNIQUE: Multiplanar, multiecho pulse sequences of the brain and surrounding structures were obtained without intravenous contrast.  Angiographic images of the head were obtained using MRA technique without contrast. COMPARISON:  Head CT June 09, 2020. MRI of the brain June 17, 2018. FINDINGS: MRI HEAD FINDINGS Brain: Punctate focus of restricted diffusion within the dorsal pons on the right side. This may represent an acute/subacute infarct versus active demyelination. Scattered and confluent foci of T2 hyperintensity are seen within the white matter of the cerebral hemispheres and within the pons, nonspecific, consistent with the clinical diagnosis of multiple sclerosis with multiple new lesions seen when compared to prior MRI. Foci of tissue loss within the bilateral lenticulocapsular region and within the pons with associated susceptibility artifact most likely represent remote lacunar infarcts. Vascular: Normal flow voids. Skull and upper cervical spine: Normal marrow signal. Sinuses/Orbits: Bilateral lens surgery. Mucous retention cyst in the left sphenoid sinus. MRA HEAD FINDINGS The visualized portions of the distal cervical and intracranial internal carotid arteries are widely patent with normal flow related enhancement. The bilateral anterior cerebral arteries and middle cerebral arteries are widely patent with antegrade flow without high-grade flow-limiting stenosis or proximal branch occlusion. No intracranial aneurysm within the anterior circulation. The vertebral arteries are widely patent with antegrade flow. The posterior inferior cerebral arteries are normal. Vertebrobasilar junction and basilar artery are widely patent with antegrade flow without evidence of basilar stenosis or aneurysm. Posterior cerebral arteries are normal bilaterally. No intracranial aneurysm within the posterior circulation. IMPRESSION: 1. Punctate focus of restricted diffusion within the dorsal pons on the right side. This may represent an acute/subacute infarct versus active demyelination. 2. Scattered and confluent foci of T2 hyperintensity are  seen within the white matter of the cerebral hemispheres and pons, nonspecific, consistent with the clinical diagnosis of multiple sclerosis with multiple new lesions. 3. Unremarkable MRA of the head without evidence of high-grade flow-limiting stenosis or proximal branch occlusion. These results were discussed in person at the time of interpretation on 06/09/2020 at 9:35 am with Dr. Lesleigh Noe . Electronically Signed   By: Pedro Earls M.D.   On: 06/09/2020 09:36   MR BRAIN W CONTRAST  Result Date: 06/09/2020 CLINICAL DATA:  Brain mass or lesion. MRI of the brain earlier today showing focus of restricted diffusion within the pons concerning  for infarct versus active demyelination. EXAM: MRI HEAD WITH CONTRAST TECHNIQUE: Multiplanar, multiecho pulse sequences of the brain and surrounding structures were obtained with intravenous contrast. CONTRAST:  24m GADAVIST GADOBUTROL 1 MMOL/ML IV SOLN COMPARISON:  MRI of the brain without contrast June 09, 2020. FINDINGS: Limited study with only T1 postcontrast images and T2 coronal. No focus of abnormal contrast enhancement associated with the T2 hyperintense white matter lesions or correlating with focus of restricted diffusion within the pons described on prior MRI to suggest active demyelination. Therefore, focus of restricted diffusion seen on prior MRI likely represents a small acute ischemic insult. IMPRESSION: No focus of abnormal contrast enhancement. Focus of restricted diffusion within the pons seen on prior MRI likely represents a small acute ischemic insult. Electronically Signed   By: KPedro EarlsM.D.   On: 06/09/2020 14:19   CT HEAD CODE STROKE WO CONTRAST  Result Date: 06/09/2020 CLINICAL DATA:  Code stroke.  Neuro deficit, acute, stroke suspected EXAM: CT HEAD WITHOUT CONTRAST TECHNIQUE: Contiguous axial images were obtained from the base of the skull through the vertex without intravenous contrast. COMPARISON:   06/17/2018 FINDINGS: Brain: No acute infarct or intracranial hemorrhage. No mass lesion. No midline shift, ventriculomegaly or extra-axial fluid collection. Scattered and confluent supratentorial white matter hypodensities likely reflect sequela of multiple sclerosis. Chronic lacunar insult involving the left internal capsule posterior limb. Vascular: No hyperdense vessel or unexpected calcification. Carotid siphon atherosclerotic calcifications. Skull: Negative for fracture or focal lesion. Sinuses/Orbits: No acute orbital finding. Small sphenoid sinus mucous retention cyst. Other: None. ASPECTS [facility]) - Ganglionic level infarction (caudate, lentiform nuclei, internal capsule, insula, M1-M3 cortex): 7 - Supraganglionic infarction (M4-M6 cortex): 3 Total score (0-10 with 10 being normal): 10 IMPRESSION: 1. No acute intracranial process. Supratentorial demyelinating lesions. 2. Chronic left internal capsule lacunar insult. 3. ASPECTS is 10 Code stroke imaging results were communicated on 06/09/2020 at 8:09 am to provider Dr BCurly Shoresvia secure text paging. Electronically Signed   By: CPrimitivo GauzeM.D.   On: 06/09/2020 08:11     Assessment/Plan: Diagnosis: MS excacerbation 1. Does the need for close, 24 hr/day medical supervision in concert with the patient's rehab needs make it unreasonable for this patient to be served in a less intensive setting? Yes 2. Co-Morbidities requiring supervision/potential complications: CKD IV, DM, HTN, morbid obesity, previous CVA's 3. Due to bladder management, bowel management, safety, skin/wound care, disease management, medication administration, pain management and patient education, does the patient require 24 hr/day rehab nursing? Yes 4. Does the patient require coordinated care of a physician, rehab nurse, therapy disciplines of PT, OT, SLP to address physical and functional deficits in the context of the above medical diagnosis(es)?  Yes Addressing deficits in the following areas: balance, endurance, locomotion, strength, transferring, bowel/bladder control, bathing, dressing, feeding, grooming, toileting, cognition, speech, swallowing and psychosocial support 5. Can the patient actively participate in an intensive therapy program of at least 3 hrs of therapy per day at least 5 days per week? Yes 6. The potential for patient to make measurable gains while on inpatient rehab is excellent 7. Anticipated functional outcomes upon discharge from inpatient rehab are supervision and min assist  with PT, supervision and min assist with OT, supervision with SLP. 8. Estimated rehab length of stay to reach the above functional goals is: 20-25 days 9. Anticipated discharge destination: Home 10. Overall Rehab/Functional Prognosis: excellent  RECOMMENDATIONS: This patient's condition is appropriate for continued rehabilitative care in  the following setting: CIR Patient has agreed to participate in recommended program. Yes Note that insurance prior authorization may be required for reimbursement for recommended care.  Comment: Rehab Admissions Coordinator to follow up.  Thanks,  Meredith Staggers, MD, Mellody Drown  I have personally performed a face to face diagnostic evaluation of this patient. Additionally, I have examined pertinent labs and radiographic images. I have reviewed and concur with the physician assistant's documentation above.    Lavon Paganini Angiulli, PA-C 06/10/2020

## 2020-06-10 NOTE — Progress Notes (Signed)
Initial Nutrition Assessment  DOCUMENTATION CODES:   Not applicable  INTERVENTION:  Provide Ensure Enlive po BID, each supplement provides 350 kcal and 20 grams of protein.  Encourage adequate PO intake.   Recommend obtaining new weight to fully assess weight trends.   NUTRITION DIAGNOSIS:   Increased nutrient needs related to chronic illness (stroke, MS) as evidenced by estimated needs.  GOAL:   Patient will meet greater than or equal to 90% of their needs  MONITOR:   PO intake,Supplement acceptance,Skin,Weight trends,Labs,I & O's  REASON FOR ASSESSMENT:   Consult Assessment of nutrition requirement/status  ASSESSMENT:   55 year old female with history of CKD stage IV, hypertension, type 2 diabetes multiple strokes with right-sided spastic hemiparesis, GERD, multiple sclerosis and chorioretinal inflammation of both eyes presents with new onset visual changes and worsening right-sided weakness. Scattered and confluent foci of T2 hyperintensity also seen within the white matter of the cerebral hemispheres and pons, nonspecific consistent with clinical diagnosis of multiple sclerosis with multiple new lesions. MRI with contrast shows acute ischemic stroke   Pt unavailable during attempted time of visit. Pt busy working with Therapist, sports. RD unable to obtain pt nutrition history at this time. Noted no new recorded weight. Recommend obtaining new weight to fully assess weight trends. RD to order nutritional supplements to aid in caloric and protein needs. Unable to complete Nutrition-Focused physical exam at this time.   Labs and medications reviewed.   Diet Order:   Diet Order            Diet heart healthy/carb modified Room service appropriate? Yes; Fluid consistency: Thin  Diet effective ____                 EDUCATION NEEDS:   Not appropriate for education at this time  Skin:  Skin Assessment: Reviewed RN Assessment  Last BM:  Unknown  Height:   Ht Readings from Last 1  Encounters:  02/06/19 '5\' 2"'$  (1.575 m)    Weight:   Wt Readings from Last 1 Encounters:  02/06/19 66.7 kg    Estimated Nutritional Needs:   Kcal:  1800-2000  Protein:  85-100 grams  Fluid:  >/= 1.8 L/day   Corrin Parker, MS, RD, LDN RD pager number/after hours weekend pager number on Amion.

## 2020-06-10 NOTE — Progress Notes (Addendum)
STROKE TEAM PROGRESS NOTE   INTERVAL HISTORY No acute events in past 24 hours.  Patient denies new concerns or symptoms today. Her impaired vision is bothering her most and seems to be the same as yesterday. She describes having been told she had MS many years ago in her 16s when she developed visual changes and then a couple of years ago when she had a stroke. She reports taking her plavix daily but perhaps not at the same time each day. Reports compliance except for last month when she did not take for two days when her daughter became ill with COVID. She has never received treatment for MS that she can recall.  She reports that she did not come to previous neurology/sroke follow up appointments due to fear of COVID.   Her daughter is at the bedside.   Vitals:   06/09/20 2003 06/09/20 2344 06/10/20 0414 06/10/20 0838  BP: (!) 165/78 (!) 149/76 (!) 153/87 (!) 178/85  Pulse: 80 79 89 87  Resp: '18 16 14 16  '$ Temp: 97.9 F (36.6 C) 98.4 F (36.9 C) 98.3 F (36.8 C) 98.8 F (37.1 C)  TempSrc: Oral Oral Oral Oral  SpO2: 99% 98% 100% 99%   CBC:  Recent Labs  Lab 06/09/20 0750 06/09/20 0803  WBC 6.3  --   NEUTROABS 3.9  --   HGB 11.0* 10.5*  HCT 34.0* 31.0*  MCV 87.0  --   PLT 244  --    Basic Metabolic Panel:  Recent Labs  Lab 06/09/20 0750 06/09/20 0803  NA 138 139  K 3.8 3.8  CL 103 102  CO2 24  --   GLUCOSE 161* 157*  BUN 39* 42*  CREATININE 4.11* 4.30*  CALCIUM 9.2  --    Lipid Panel:  Recent Labs  Lab 06/10/20 0543  CHOL 133  TRIG 113  HDL 54  CHOLHDL 2.5  VLDL 23  LDLCALC 56   HgbA1c:  Recent Labs  Lab 06/10/20 0543  HGBA1C 8.9*   Urine Drug Screen: No results for input(s): LABOPIA, COCAINSCRNUR, LABBENZ, AMPHETMU, THCU, LABBARB in the last 168 hours.  Alcohol Level  Recent Labs  Lab 06/09/20 0750  ETH <10   IMAGING past 24 hours MR BRAIN W CONTRAST  Result Date: 06/09/2020 CLINICAL DATA:  Brain mass or lesion. MRI of the brain earlier today  showing focus of restricted diffusion within the pons concerning for infarct versus active demyelination. EXAM: MRI HEAD WITH CONTRAST TECHNIQUE: Multiplanar, multiecho pulse sequences of the brain and surrounding structures were obtained with intravenous contrast. CONTRAST:  42m GADAVIST GADOBUTROL 1 MMOL/ML IV SOLN COMPARISON:  MRI of the brain without contrast June 09, 2020. FINDINGS: Limited study with only T1 postcontrast images and T2 coronal. No focus of abnormal contrast enhancement associated with the T2 hyperintense white matter lesions or correlating with focus of restricted diffusion within the pons described on prior MRI to suggest active demyelination. Therefore, focus of restricted diffusion seen on prior MRI likely represents a small acute ischemic insult. IMPRESSION: No focus of abnormal contrast enhancement. Focus of restricted diffusion within the pons seen on prior MRI likely represents a small acute ischemic insult. Electronically Signed   By: KPedro EarlsM.D.   On: 06/09/2020 14:19   PHYSICAL EXAM Physical Exam  Constitutional: Appears well-developed and well-nourished.  Psych: Bright affect, calm, pleasant and conversant Eyes: Dysconjugate, normal conjunctiva Head: Normocephalic, atraumatic Respiratory: No extra work of breathing    Neuro: Mental Status: Patient  is awake, alert, oriented to person, place, month, year, and situation. Patient does not stay on topic during discussion and requires constant redirecting. Speech is clear and appropriate. Names 2/2 objects. Repetition intact.   Cranial Nerves: II: She attends to motion in all visual fields. Pupils are irregular and sluggishly reactive to light bilaterally.   III,IV, VI: Partial gaze palsy noted in the right eye with leftward gaze. Right eye with poor lateral movement, especially medially. Gaze disconjugate. Able to attend to each visual field with extensive coaching. Gaze with direction changing  nystagmus with a notable torsional component.  V: Facial sensation is symmetric to light touch and temperature. VII: Facial movement is asymmetric resting and smiling with notable right-sided facial droop.   VIII: Hearing is intact to voice X: Palate elevates symmetrically XI: Shoulder shrug is asymmetric with decreased strength on the right.  XII: Tongue protrudes midline without fasciculations.  Motor: Tone is increased on the right, normal on the left extremities. Bulk is normal. 4/5 strength noted on the right upper and lower extremity with 5/5 strength on the left upper and lower extremities. Antigravity movement present in all four extremities. Pronator drift noted to the right upper and lower extremities but without drift to bed. No drift on the left upper or lower extremities.  Sensory: Sensation is symmetric to light touch and temperature in the arms and legs. Plantars: Toes are downgoing bilaterally. Cerebellar: FNF and HKS are intact on the left extremities, ataxic on the right upper and lower extremities; this is baseline for patient.  ASSESSMENT/PLAN  Hannah [name] is a 55 year old African-American female with remote history of multiple left brain subcortical infarcts (2001, 2008, 2019) on plavix with residual spastic right hemiparesis and mild pseudobulbar state. Possible remote hx of one stroke post CVA per daughter. Not on AEDs. 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   Stroke - Dorsal pontine small stroke most likely due to small vessel disease   Code Stroke CT head No acute abnormality.   CT head no acute abnormality.  MRI brain without contrast: Punctate focus of restricted diffusion within the dorsal  pons on the right side. This may represent an acute/subacute infarct versus active demyelination.   MRI brain with contrast: No focus of abnormal contrast enhancement. Focus of restricted diffusion within the pons seen on prior MRI likely represents a small acute ischemic insult  MRA head unremarkable   Carotid Doppler unremarkable  2D Echo PENDING  LDL 56  HgbA1c 8.9  VTE prophylaxis - On Lovenox PPX.   On plavix prior to admission She reported ASA stopped by another provider and has Aspirin listed as allergy in chart. She tolerated ASA in ED.  Reports compliance with plavix except for last month when she did not take for two days when her daughter became ill with COVID.  Now on aspirin and Plavix DVT for 3 weeks and then Plavix alone.  Therapy recommendations:  CIR, consult to CIR pending  Disposition:  TBD  ?? Multiple Sclerosis  Questionable history of multiple sclerosis  Not previously treated for MS   MRI brain without contrast - scattered and confluent foci of T2 hyperintensity are seen within the white matter of the cerebral hemispheres and pons, nonspecific,consistent with the clinical diagnosis of multiple sclerosis  with multiple new lesions  Plan to follow up with Dr. Leonie Man for further evaluation for MS   History of prior CVA:  Documented in details in Dr. Clydene Fake clinical notes.  2011 had slurred speech and right-sided weakness  12/2016 details not clear  06/2017 cerebellar infarcts, CT head and neck negative.  02/2018 A1c 13.0 LDL 47, put on Plavix  Patient has residual right spastic hemiparesis  Follow-up with Dr. Leonie Man at [facility]  Hypertension  Home meds:  Lopressor, HCTZ, Losartan, Norvasc  Saw a nephrologist recently but did not follow up . BP on the high end, gradually normalize in 3-5 days . Long-term BP goal normotensive  Hyperlipidemia  Home meds:  Crestor '20mg'$  daily  LDL 56, goal < 70  Continue statin at discharge  Diabetes type II,  Uncontrolled  Home meds:  Tresiba, Glucotrol, Lispro Insulin  HgbA1c 8.9, goal < 7.0 (has been as high as 13 in 2019) .  CBGs  SSI  Primary team planning to titrate insulin in controlled environment  Other Stroke Risk Factors  None  Other Active Problem  AKI on CKD 4 - patient probably has progresive kidney disease. She has previously been seen at [facility] but was noncompliant with follow up. Primary team giving IVF today. She will need to keep up outpatient follow up with nephrology.   Pattern of non-compliance with medical follow up plans. Patient was counseled regarding this.   This plan of care was discussed with and directed by Dr. Erlinda Hong.   Hetty Blend, NP-C  Hospital day # 1   ATTENDING NOTE: I reviewed above note and agree with the assessment and plan. Pt was seen and examined.   55 year old female with history of CKD, hypertension, hyperlipidemia, diabetes,?  MS, several strokes admitted for right facial droop, right-sided weakness, diplopia, vertigo.  CT no acute abnormality.  MRI with and without contrast showed right dorsal punctate pontine infarct, however multifocal white matter ischemic changes versus demyelination, MS cannot be ruled out.  MRA unremarkable.  Carotid Doppler negative.  2D echo pending.  LDL 56 and A1c 8.9.  Creatinine 4.11-> 4.30.  On exam, daughter at bedside, awake alert, orientated x3.  No aphasia, mild dysarthria, able to name and repeat to follow simple commands.  Visual field fall, right INO with bilateral gaze bidirectional horizontal nystagmus.  PERRL.  Right facial droop, right upper and lower extremity 3/5.  Right finger-to-nose ataxic seems still proportional to the weakness.  Sensation symmetrical.  Patient current clinical presentation and MRI findings still more consistent with punctate dorsal pontine infarct.  However, patient does have questionable history of multiple sclerosis, MRI findings also suggest chronic ischemic  white matter changes versus demyelination, she will continue follow-up with Dr. Leonie Man at [facility] for further evaluation.  Currently on DAPT and statin.  Continue aspirin and Plavix for 3 weeks and then Plavix alone.  Continue Crestor.  PT/OT recommend CIR. Neurology will sign off. Please call with questions. Pt will follow up with stroke clinic Dr. Leonie Man at [facility] in about 4 weeks. Thanks for the consult.   Rosalin Hawking, MD PhD Stroke Neurology 06/10/2020 3:56 PM      To contact Stroke Continuity provider, please refer to [email or web address] After hours, contact General Neurology

## 2020-06-10 NOTE — Progress Notes (Signed)
OT Cancellation Note  Patient Details Name: Hannah [name] MRN: [id number] DOB: 1965/10/18   Cancelled Treatment:    Reason Eval/Treat Not Completed: Other (comment) (Pt eating lunch at 11am and at 12:15p as she was interrupted by ECHO and MD visits so OT to evaluate next available treatment time.)   OT following for OT eval.  Jefferey Pica, OTR/L Acute Rehabilitation Services Pager: [phone] Office: [phone]   [name] C 06/10/2020, 12:15 PM

## 2020-06-10 NOTE — Progress Notes (Signed)
PROGRESS NOTE    Hannah [name]  [id number] DOB: 07-10-1965 DOA: 06/09/2020 PCP: Vernie Shanks, MD    Brief Narrative:  55 year old lady with extensive medical issues including chronic kidney disease stage IV, hypertension, type 2 diabetes uncontrolled, multiple strokes on aspirin and Plavix with right-sided spastic hemiparesis, GERD, self-reported multiple sclerosis and chorioretinal inflammation of both eyes presented to the ER with new onset visual changes and worsening right-sided weakness. Patient's main complaint is visual changes, she states that she is jumbling of the vision from 2 eyes, her right side is more weaker but today it has improved a bit. In the emergency room, hemodynamically stable.  Not an intervention candidate due to known previous deficit and unable to determine the difference.  MRI with acute a stroke.   Assessment & Plan:   Principal Problem:   Stroke-like symptoms Active Problems:   Multiple sclerosis (HCC)   Hypertension   Hyperlipidemia   Diabetes type 2, controlled ([place])   Acute kidney injury superimposed on CKD ([place])   Cerebral thrombosis with cerebral infarction  Acute ischemic stroke, right pontine stroke: Clinical findings, dysarthria, disconjugate gaze in the setting of baseline right spastic hemiparesis. CT head findings, old ischemic changes. MRI of the brain, right dorsal pons acute a stroke.  MRI with contrast shows acute ischemic stroke, no evidence of demyelinating disease. MRA of the head and neck, no major vessel occlusion. 2D echocardiogram, pending today. Antiplatelet therapy, on aspirin and Plavix at home.  Continued. LDL, 56.  Already on a statin.  She is on goal.   Hemoglobin A1c, 8.9.  Poorly controlled. DVT prophylaxis, Lovenox subcu. Therapy recommendations, acute inpatient rehab. Continue to work with PT OT and speech. Followed by neurology. Refer to CIR.  Self reported multiple sclerosis: no evidence of MS  flare.  HTN: no need for permissive hypertension. Resume metoprolol and amlodipine.  AKI on CKD 4: patient probably have progresive kidney disease. She has previously been seen at [facility].Will keep on isotonic fluid today, recheck tomorrow. She will need to keep up outpatient follow up with nephrology.  HLD: on crestor , continue   Poorly controlled diabetes on insulin: A1c 8.9 . Resume insulin and uptitrate while in medical care. CIR may be a good place to titrate insulin under supervision.  DVT prophylaxis: enoxaparin (LOVENOX) injection 30 mg Start: 06/10/20 1200   Code Status: lovenox  Family Communication: 2 daughters on the facetime, one daughter at bedside. Disposition Plan: Status is: Inpatient  Remains inpatient appropriate because:Inpatient level of care appropriate due to severity of illness   Dispo: The patient is from: Home              Anticipated d/c is to: CIR              Anticipated d/c date is: 1 day              Patient currently is medically stable to d/c.   Difficult to place patient No         Consultants:   Neurology   Procedures:   None   Antimicrobials:   None     Subjective: Patient seen and examined.  Her 2 daughters were on the FaceTime.  Another daughter back at the bedside.  No overnight events. Patient was very conversant with some dysarthria.  She tells me that her main problem is her vision.  She has weakness on the right side that was worse yesterday but today is improving somehow. We discussed  about continue to follow-up with ophthalmology and nephrology as outpatient once discharged from rehab. Patient and family were wondering whether we give her steroids for MS flare.  I told him that there is no evidence of MS flare today.  Objective: Vitals:   06/09/20 2344 06/10/20 0414 06/10/20 0838 06/10/20 1229  BP: (!) 149/76 (!) 153/87 (!) 178/85 (!) 167/87  Pulse: 79 89 87 86  Resp: '16 14 16 16  '$ Temp: 98.4 F (36.9 C)  98.3 F (36.8 C) 98.8 F (37.1 C) 98.3 F (36.8 C)  TempSrc: Oral Oral Oral Oral  SpO2: 98% 100% 99% 100%    Intake/Output Summary (Last 24 hours) at 06/10/2020 1413 Last data filed at 06/10/2020 1108 Gross per 24 hour  Intake 394.94 ml  Output 650 ml  Net -255.06 ml   There were no vitals filed for this visit.  Examination:  General exam: Appears calm and comfortable  Respiratory system: Clear to auscultation. Respiratory effort normal. Cardiovascular system: S1 & S2 heard, RRR. No JVD, murmurs, rubs, gallops or clicks. No pedal edema. Gastrointestinal system: Abdomen is nondistended, soft and nontender. No organomegaly or masses felt. Normal bowel sounds heard. Central nervous system: Alert and oriented.  Her speech is mildly dysarthric, baseline as per family. Right lateral gaze paralysis. Increased tone on the right, 4/5 motor power right upper and lower extremity.  Left is normal.   Data Reviewed: I have personally reviewed following labs and imaging studies  CBC: Recent Labs  Lab 06/09/20 0750 06/09/20 0803  WBC 6.3  --   NEUTROABS 3.9  --   HGB 11.0* 10.5*  HCT 34.0* 31.0*  MCV 87.0  --   PLT 244  --    Basic Metabolic Panel: Recent Labs  Lab 06/09/20 0750 06/09/20 0803  NA 138 139  K 3.8 3.8  CL 103 102  CO2 24  --   GLUCOSE 161* 157*  BUN 39* 42*  CREATININE 4.11* 4.30*  CALCIUM 9.2  --    GFR: CrCl cannot be calculated (Unknown ideal weight.). Liver Function Tests: Recent Labs  Lab 06/09/20 0750  AST 17  ALT 10  ALKPHOS 67  BILITOT 0.4  PROT 7.6  ALBUMIN 3.1*   No results for input(s): LIPASE, AMYLASE in the last 168 hours. No results for input(s): AMMONIA in the last 168 hours. Coagulation Profile: Recent Labs  Lab 06/09/20 0750  INR 0.9   Cardiac Enzymes: No results for input(s): CKTOTAL, CKMB, CKMBINDEX, TROPONINI in the last 168 hours. BNP (last 3 results) No results for input(s): PROBNP in the last 8760  hours. HbA1C: Recent Labs    06/10/20 0543  HGBA1C 8.9*   CBG: Recent Labs  Lab 06/09/20 1258 06/09/20 1632 06/09/20 2101 06/10/20 0608 06/10/20 1128  GLUCAP 203* 196* 102* 82 135*   Lipid Profile: Recent Labs    06/10/20 0543  CHOL 133  HDL 54  LDLCALC 56  TRIG 113  CHOLHDL 2.5   Thyroid Function Tests: Recent Labs    06/09/20 1635  TSH 0.623   Anemia Panel: No results for input(s): VITAMINB12, FOLATE, FERRITIN, TIBC, IRON, RETICCTPCT in the last 72 hours. Sepsis Labs: No results for input(s): PROCALCITON, LATICACIDVEN in the last 168 hours.  Recent Results (from the past 240 hour(s))  SARS CORONAVIRUS 2 (TAT 6-24 HRS) Nasopharyngeal Nasopharyngeal Swab     Status: None   Collection Time: 06/09/20  9:15 AM   Specimen: Nasopharyngeal Swab  Result Value Ref Range Status   SARS Coronavirus  2 NEGATIVE NEGATIVE Final    Comment: (NOTE) SARS-CoV-2 target nucleic acids are NOT DETECTED.  The SARS-CoV-2 RNA is generally detectable in upper and lower respiratory specimens during the acute phase of infection. Negative results do not preclude SARS-CoV-2 infection, do not rule out co-infections with other pathogens, and should not be used as the sole basis for treatment or other patient management decisions. Negative results must be combined with clinical observations, patient history, and epidemiological information. The expected result is Negative.  Fact Sheet for Patients: SugarRoll.be  Fact Sheet for Healthcare Providers: https://www.woods-mathews.com/  This test is not yet approved or cleared by the [place] FDA and  has been authorized for detection and/or diagnosis of SARS-CoV-2 by FDA under an Emergency Use Authorization (EUA). This EUA will remain  in effect (meaning this test can be used) for the duration of the COVID-19 declaration under Se ction 564(b)(1) of the Act, 21 U.S.C. section 360bbb-3(b)(1),  unless the authorization is terminated or revoked sooner.  Performed at [facility], [place] [address] [place], [place] [address]          Radiology Studies: MR ANGIO HEAD WO CONTRAST  Result Date: 06/09/2020 CLINICAL DATA:  Neuro deficit, acute, stroke suspected. EXAM: MRI HEAD WITHOUT CONTRAST MRA HEAD WITHOUT CONTRAST TECHNIQUE: Multiplanar, multiecho pulse sequences of the brain and surrounding structures were obtained without intravenous contrast. Angiographic images of the head were obtained using MRA technique without contrast. COMPARISON:  Head CT June 09, 2020. MRI of the brain June 17, 2018. FINDINGS: MRI HEAD FINDINGS Brain: Punctate focus of restricted diffusion within the dorsal pons on the right side. This may represent an acute/subacute infarct versus active demyelination. Scattered and confluent foci of T2 hyperintensity are seen within the white matter of the cerebral hemispheres and within the pons, nonspecific, consistent with the clinical diagnosis of multiple sclerosis with multiple new lesions seen when compared to prior MRI. Foci of tissue loss within the bilateral lenticulocapsular region and within the pons with associated susceptibility artifact most likely represent remote lacunar infarcts. Vascular: Normal flow voids. Skull and upper cervical spine: Normal marrow signal. Sinuses/Orbits: Bilateral lens surgery. Mucous retention cyst in the left sphenoid sinus. MRA HEAD FINDINGS The visualized portions of the distal cervical and intracranial internal carotid arteries are widely patent with normal flow related enhancement. The bilateral anterior cerebral arteries and middle cerebral arteries are widely patent with antegrade flow without high-grade flow-limiting stenosis or proximal branch occlusion. No intracranial aneurysm within the anterior circulation. The vertebral arteries are widely patent with antegrade flow. The posterior inferior cerebral arteries are  normal. Vertebrobasilar junction and basilar artery are widely patent with antegrade flow without evidence of basilar stenosis or aneurysm. Posterior cerebral arteries are normal bilaterally. No intracranial aneurysm within the posterior circulation. IMPRESSION: 1. Punctate focus of restricted diffusion within the dorsal pons on the right side. This may represent an acute/subacute infarct versus active demyelination. 2. Scattered and confluent foci of T2 hyperintensity are seen within the white matter of the cerebral hemispheres and pons, nonspecific, consistent with the clinical diagnosis of multiple sclerosis with multiple new lesions. 3. Unremarkable MRA of the head without evidence of high-grade flow-limiting stenosis or proximal branch occlusion. These results were discussed in person at the time of interpretation on 06/09/2020 at 9:35 am with Dr. Lesleigh Noe . Electronically Signed   By: Pedro Earls M.D.   On: 06/09/2020 09:36   MR BRAIN WO CONTRAST  Result Date: 06/09/2020 CLINICAL DATA:  Neuro deficit, acute, stroke suspected. EXAM: MRI HEAD WITHOUT CONTRAST MRA HEAD WITHOUT CONTRAST TECHNIQUE: Multiplanar, multiecho pulse sequences of the brain and surrounding structures were obtained without intravenous contrast. Angiographic images of the head were obtained using MRA technique without contrast. COMPARISON:  Head CT June 09, 2020. MRI of the brain June 17, 2018. FINDINGS: MRI HEAD FINDINGS Brain: Punctate focus of restricted diffusion within the dorsal pons on the right side. This may represent an acute/subacute infarct versus active demyelination. Scattered and confluent foci of T2 hyperintensity are seen within the white matter of the cerebral hemispheres and within the pons, nonspecific, consistent with the clinical diagnosis of multiple sclerosis with multiple new lesions seen when compared to prior MRI. Foci of tissue loss within the bilateral lenticulocapsular region and  within the pons with associated susceptibility artifact most likely represent remote lacunar infarcts. Vascular: Normal flow voids. Skull and upper cervical spine: Normal marrow signal. Sinuses/Orbits: Bilateral lens surgery. Mucous retention cyst in the left sphenoid sinus. MRA HEAD FINDINGS The visualized portions of the distal cervical and intracranial internal carotid arteries are widely patent with normal flow related enhancement. The bilateral anterior cerebral arteries and middle cerebral arteries are widely patent with antegrade flow without high-grade flow-limiting stenosis or proximal branch occlusion. No intracranial aneurysm within the anterior circulation. The vertebral arteries are widely patent with antegrade flow. The posterior inferior cerebral arteries are normal. Vertebrobasilar junction and basilar artery are widely patent with antegrade flow without evidence of basilar stenosis or aneurysm. Posterior cerebral arteries are normal bilaterally. No intracranial aneurysm within the posterior circulation. IMPRESSION: 1. Punctate focus of restricted diffusion within the dorsal pons on the right side. This may represent an acute/subacute infarct versus active demyelination. 2. Scattered and confluent foci of T2 hyperintensity are seen within the white matter of the cerebral hemispheres and pons, nonspecific, consistent with the clinical diagnosis of multiple sclerosis with multiple new lesions. 3. Unremarkable MRA of the head without evidence of high-grade flow-limiting stenosis or proximal branch occlusion. These results were discussed in person at the time of interpretation on 06/09/2020 at 9:35 am with Dr. Lesleigh Noe . Electronically Signed   By: Pedro Earls M.D.   On: 06/09/2020 09:36   MR BRAIN W CONTRAST  Result Date: 06/09/2020 CLINICAL DATA:  Brain mass or lesion. MRI of the brain earlier today showing focus of restricted diffusion within the pons concerning for infarct  versus active demyelination. EXAM: MRI HEAD WITH CONTRAST TECHNIQUE: Multiplanar, multiecho pulse sequences of the brain and surrounding structures were obtained with intravenous contrast. CONTRAST:  67m GADAVIST GADOBUTROL 1 MMOL/ML IV SOLN COMPARISON:  MRI of the brain without contrast June 09, 2020. FINDINGS: Limited study with only T1 postcontrast images and T2 coronal. No focus of abnormal contrast enhancement associated with the T2 hyperintense white matter lesions or correlating with focus of restricted diffusion within the pons described on prior MRI to suggest active demyelination. Therefore, focus of restricted diffusion seen on prior MRI likely represents a small acute ischemic insult. IMPRESSION: No focus of abnormal contrast enhancement. Focus of restricted diffusion within the pons seen on prior MRI likely represents a small acute ischemic insult. Electronically Signed   By: KPedro EarlsM.D.   On: 06/09/2020 14:19   CT HEAD CODE STROKE WO CONTRAST  Result Date: 06/09/2020 CLINICAL DATA:  Code stroke.  Neuro deficit, acute, stroke suspected EXAM: CT HEAD WITHOUT CONTRAST TECHNIQUE: Contiguous axial images were obtained from the base of the skull  through the vertex without intravenous contrast. COMPARISON:  06/17/2018 FINDINGS: Brain: No acute infarct or intracranial hemorrhage. No mass lesion. No midline shift, ventriculomegaly or extra-axial fluid collection. Scattered and confluent supratentorial white matter hypodensities likely reflect sequela of multiple sclerosis. Chronic lacunar insult involving the left internal capsule posterior limb. Vascular: No hyperdense vessel or unexpected calcification. Carotid siphon atherosclerotic calcifications. Skull: Negative for fracture or focal lesion. Sinuses/Orbits: No acute orbital finding. Small sphenoid sinus mucous retention cyst. Other: None. ASPECTS [facility] Stroke Program Early CT Score) - Ganglionic level infarction (caudate,  lentiform nuclei, internal capsule, insula, M1-M3 cortex): 7 - Supraganglionic infarction (M4-M6 cortex): 3 Total score (0-10 with 10 being normal): 10 IMPRESSION: 1. No acute intracranial process. Supratentorial demyelinating lesions. 2. Chronic left internal capsule lacunar insult. 3. ASPECTS is 10 Code stroke imaging results were communicated on 06/09/2020 at 8:09 am to provider Dr Curly [place] via secure text paging. Electronically Signed   By: Primitivo Gauze M.D.   On: 06/09/2020 08:11   VAS US CAROTID  Result Date: 06/10/2020 Carotid Arterial Duplex Study Indications:       CVA. Risk Factors:      Hypertension, hyperlipidemia, Diabetes. Comparison Study:  No prior studies. Performing Technologist: Oliver Hum RVT  Examination Guidelines: A complete evaluation includes B-mode imaging, spectral Doppler, color Doppler, and power Doppler as needed of all accessible portions of each vessel. Bilateral testing is considered an integral part of a complete examination. Limited examinations for reoccurring indications may be performed as noted.  Right Carotid Findings: +----------+--------+--------+--------+-----------------------+--------+           PSV cm/sEDV cm/sStenosisPlaque Description     Comments +----------+--------+--------+--------+-----------------------+--------+ CCA Prox  50      7               smooth and heterogenous         +----------+--------+--------+--------+-----------------------+--------+ CCA Distal79      15              smooth and heterogenous         +----------+--------+--------+--------+-----------------------+--------+ ICA Prox  81      24              smooth and heterogenous         +----------+--------+--------+--------+-----------------------+--------+ ICA Distal61      17                                     tortuous +----------+--------+--------+--------+-----------------------+--------+ ECA       91      9                                                +----------+--------+--------+--------+-----------------------+--------+ +----------+--------+-------+--------+-------------------+           PSV cm/sEDV cmsDescribeArm Pressure (mmHG) +----------+--------+-------+--------+-------------------+ [id number]                                        +----------+--------+-------+--------+-------------------+ +---------+--------+--+--------+--+---------+ VertebralPSV cm/s48EDV cm/s11Antegrade +---------+--------+--+--------+--+---------+  Left Carotid Findings: +----------+--------+--------+--------+-----------------------+--------+           PSV cm/sEDV cm/sStenosisPlaque Description     Comments +----------+--------+--------+--------+-----------------------+--------+ CCA Prox  77      13  smooth and heterogenous         +----------+--------+--------+--------+-----------------------+--------+ CCA Distal66      12              smooth and heterogenous         +----------+--------+--------+--------+-----------------------+--------+ ICA Prox  84      27              smooth and heterogenoustortuous +----------+--------+--------+--------+-----------------------+--------+ ICA Distal69      20                                     tortuous +----------+--------+--------+--------+-----------------------+--------+ ECA       66      10                                              +----------+--------+--------+--------+-----------------------+--------+ +----------+--------+--------+--------+-------------------+           PSV cm/sEDV cm/sDescribeArm Pressure (mmHG) +----------+--------+--------+--------+-------------------+ Subclavian129                                         +----------+--------+--------+--------+-------------------+ +---------+--------+--+--------+-+---------+ VertebralPSV cm/s44EDV cm/s7Antegrade +---------+--------+--+--------+-+---------+   Summary: Right  Carotid: Velocities in the right ICA are consistent with a 1-39% stenosis. Left Carotid: Velocities in the left ICA are consistent with a 1-39% stenosis. Vertebrals: Bilateral vertebral arteries demonstrate antegrade flow. *See table(s) above for measurements and observations.     Preliminary         Scheduled Meds: . amLODipine  10 mg Oral Daily  . aspirin EC  81 mg Oral Daily  . clopidogrel  75 mg Oral Daily  . enoxaparin (LOVENOX) injection  30 mg Subcutaneous Q24H  . famotidine  40 mg Oral Daily  . FLUoxetine  20 mg Oral Daily  . insulin aspart  0-15 Units Subcutaneous TID WC  . insulin aspart  0-5 Units Subcutaneous QHS  . insulin glargine  35 Units Subcutaneous QHS  . metoprolol tartrate  100 mg Oral BID  . rosuvastatin  20 mg Oral Daily   Continuous Infusions: . sodium chloride 75 mL/hr at 06/10/20 0417     LOS: 1 day    Time spent: 35 minutes     Barb Merino, MD [facility] Pager [phone]

## 2020-06-10 NOTE — Progress Notes (Signed)
Inpatient Rehab Admissions Coordinator Note:   Per therapy recommendations, pt was screened for CIR candidacy by Adair Lauderback, MS CCC-SLP. At this time, Pt. Appears to have functional decline and is a good candidate for CIR. Will place order for rehab consult per protocol.  Please contact me with questions.   Lynnlee Revels, MS, CCC-SLP Rehab Admissions Coordinator  [phone] (celll) [phone] (office)  

## 2020-06-11 ENCOUNTER — Inpatient Hospital Stay (HOSPITAL_COMMUNITY): Payer: Medicare Other

## 2020-06-11 ENCOUNTER — Other Ambulatory Visit: Payer: Self-pay

## 2020-06-11 DIAGNOSIS — I6389 Other cerebral infarction: Secondary | ICD-10-CM

## 2020-06-11 LAB — BASIC METABOLIC PANEL
Anion gap: 9 (ref 5–15)
BUN: 30 mg/dL — ABNORMAL HIGH (ref 6–20)
CO2: 25 mmol/L (ref 22–32)
Calcium: 8.9 mg/dL (ref 8.9–10.3)
Chloride: 104 mmol/L (ref 98–111)
Creatinine, Ser: 3.7 mg/dL — ABNORMAL HIGH (ref 0.44–1.00)
GFR, Estimated: 14 mL/min — ABNORMAL LOW (ref >60)
Glucose, Bld: 199 mg/dL — ABNORMAL HIGH (ref 70–99)
Potassium: 3.4 mmol/L — ABNORMAL LOW (ref 3.5–5.1)
Sodium: 138 mmol/L (ref 135–145)

## 2020-06-11 LAB — GLUCOSE, CAPILLARY
Glucose-Capillary: 77 mg/dL (ref 70–99)
Glucose-Capillary: 73 mg/dL (ref 70–99)
Glucose-Capillary: 145 mg/dL — ABNORMAL HIGH (ref 70–99)
Glucose-Capillary: 124 mg/dL — ABNORMAL HIGH (ref 70–99)
Glucose-Capillary: 126 mg/dL — ABNORMAL HIGH (ref 70–99)

## 2020-06-11 LAB — ECHOCARDIOGRAM COMPLETE
Area-P 1/2: 2.91 cm2
Calc EF: 57.9 %
S' Lateral: 2.7 cm
Single Plane A2C EF: 59.9 %
Single Plane A4C EF: 56 %

## 2020-06-11 MED ORDER — HYDRALAZINE HCL 20 MG/ML IJ SOLN: 10.0000 mg | Freq: Four times a day (QID) | INTRAMUSCULAR | Status: DC | PRN

## 2020-06-11 MED ORDER — SODIUM CHLORIDE 0.9 % IV SOLN: INTRAVENOUS | Status: DC

## 2020-06-11 MED ORDER — ZOLPIDEM TARTRATE 5 MG PO TABS
5.0000 mg | ORAL_TABLET | Freq: Every evening | ORAL | Status: DC | PRN
  Administered 2020-06-11: 5 mg via ORAL
  Filled 2020-06-11: qty 1

## 2020-06-11 MED ORDER — HEPARIN SODIUM (PORCINE) 5000 UNIT/ML IJ SOLN
5000.0000 [IU] | Freq: Three times a day (TID) | INTRAMUSCULAR | Status: DC
  Administered 2020-06-11 – 2020-06-12 (×4): 5000 [IU] via SUBCUTANEOUS
  Filled 2020-06-11 (×4): qty 1

## 2020-06-11 MED ORDER — FAMOTIDINE 20 MG PO TABS
20.0000 mg | ORAL_TABLET | Freq: Every day | ORAL | Status: DC
  Administered 2020-06-12: 20 mg via ORAL
  Filled 2020-06-11: qty 1

## 2020-06-11 MED ORDER — POTASSIUM CHLORIDE CRYS ER 20 MEQ PO TBCR
40.0000 meq | EXTENDED_RELEASE_TABLET | Freq: Once | ORAL | Status: AC
  Administered 2020-06-11: 40 meq via ORAL
  Filled 2020-06-11: qty 2

## 2020-06-11 NOTE — Progress Notes (Addendum)
PROGRESS NOTE    Hannah [name]  [id number] DOB: 05-25-1965 DOA: 06/09/2020 PCP: Vernie Shanks, MD   Chief Complain: Right-sided weakness  Brief Narrative: Patient is a 55 year old lady with history of CKD stage IV, hypertension, uncontrolled type 2 diabetes, multiple strokes with right-sided residual weakness, GERD who presented to the emergency room with complaints of new onset visual changes, worsening of right-sided weakness, slurred speech.  On presentation, she was hemodynamically stable.  MRI showed acute nonhemorrhagic stroke of right dorsal pons.  Stroke work-up initiated and completed.  PT/OT recommended CIR.  Patient is medically stable for discharge to CIR as soon as bed is available.  Assessment & Plan:   Principal Problem:   Stroke-like symptoms Active Problems:   Multiple sclerosis (HCC)   Hypertension   Hyperlipidemia   Diabetes type 2, controlled ([place])   Acute kidney injury superimposed on CKD ([place])   Cerebral thrombosis with cerebral infarction   Acute nonhemorrhagic stroke/right pontine stroke: Presented with dysarthria, gaze problem, worsening of right-sided weakness.  MRI of the brain showed right dorsal pons acute stroke.  MRA of the head and neck did not show  LVO.  Echo showed ejection fraction of 123456, grade 2 diastolic dysfunction.  She has history of  strokes and is weak on the right side.  She was already taking aspirin and Plavix at home which will be continued.  LDL of 56, hemoglobin A1c 8.9.  PT/OT recommended CIR on discharge.  AKI on CKD stage IV: She was started on IV fluids.  She follows with outpatient nephrology.  Continue to monitor BMP.  Avoid nephrotoxins. Her creatinine on 06/19/2019 was 2.8.  Creatinine of 4 on presentation.  Currently she appears euvolemic  Hypertension: On amlodipine, losartan, hydrochlorothiazide at home.  Losartan hydrochlorothiazide were held because of AKI on presentation.  Monitor blood pressure.  Continue as needed  medications for severe hypertension.  Most likely will put her on hydralazine on discharge.  History of multiple sclerosis: Self-reported.  No evidence of MS flare.  Hyperlipidemia: On Crestor  Uncontrolled diabetes type 2: Hemoglobin C of 8.9.  Takes insulin and glipizide at home.  Continue to monitor blood sugars.  Debility/deconditioning: PT/OT recommend CIR on discharge.  Nutrition Problem: Increased nutrient needs Etiology: chronic illness (stroke, MS)      DVT prophylaxis:HeparinSC Code Status: Full Family Communication: Daughter at bedside Status is: Inpatient  Remains inpatient appropriate because:Unsafe d/c plan and Inpatient level of care appropriate due to severity of illness   Dispo:  Patient From: Home  Planned Disposition: Inpatient Rehab  Expected discharge date: 06/13/20  Medically stable for discharge: Yes       Consultants: Neurology  Procedures: None  Antimicrobials:  Anti-infectives (From admission, onward)   None      Subjective: Patient seen and examined at the bedside this morning.  Hemodynamically stable.  Comfortable.  Eating her breakfast.  She still has dysarthria.  Denies any new complaints except that she could not sleep last night.  Objective: Vitals:   06/10/20 1951 06/11/20 0030 06/11/20 0551 06/11/20 0820  BP: (!) 173/81 (!) 169/80 (!) 154/81 (!) 161/76  Pulse: 81 74 72 85  Resp: '18 18 18 18  '$ Temp: 98.7 F (37.1 C) 98.7 F (37.1 C) 98.5 F (36.9 C) 98.5 F (36.9 C)  TempSrc: Oral Oral Oral Oral  SpO2: 100% 100% 100% 99%    Intake/Output Summary (Last 24 hours) at 06/11/2020 1135 Last data filed at 06/11/2020 0554 Gross per 24 hour  Intake -  Output 700 ml  Net -700 ml   There were no vitals filed for this visit.  Examination:  General exam: Pleasant female, overall comfortable HEENT:Oral mucosa moist, Ear/Nose normal on gross exam Respiratory system: Bilateral equal air entry, normal vesicular breath sounds, no  wheezes or crackles  Cardiovascular system: S1 & S2 heard, RRR. No JVD, murmurs, rubs, gallops or clicks. No pedal edema. Gastrointestinal system: Abdomen is nondistended, soft and nontender. No organomegaly or masses felt. Normal bowel sounds heard. Central nervous system: Alert and oriented.  Right-sided hemiparesis, right facial droop.  Dysarthria.Power 3/5 on the RUL,RLE Extremities: No edema, no clubbing ,no cyanosis Skin: No rashes, lesions or ulcers,no icterus ,no pallor    Data Reviewed: I have personally reviewed following labs and imaging studies  CBC: Recent Labs  Lab 06/09/20 0750 06/09/20 0803  WBC 6.3  --   NEUTROABS 3.9  --   HGB 11.0* 10.5*  HCT 34.0* 31.0*  MCV 87.0  --   PLT 244  --    Basic Metabolic Panel: Recent Labs  Lab 06/09/20 0750 06/09/20 0803  NA 138 139  K 3.8 3.8  CL 103 102  CO2 24  --   GLUCOSE 161* 157*  BUN 39* 42*  CREATININE 4.11* 4.30*  CALCIUM 9.2  --    GFR: CrCl cannot be calculated (Unknown ideal weight.). Liver Function Tests: Recent Labs  Lab 06/09/20 0750  AST 17  ALT 10  ALKPHOS 67  BILITOT 0.4  PROT 7.6  ALBUMIN 3.1*   No results for input(s): LIPASE, AMYLASE in the last 168 hours. No results for input(s): AMMONIA in the last 168 hours. Coagulation Profile: Recent Labs  Lab 06/09/20 0750  INR 0.9   Cardiac Enzymes: No results for input(s): CKTOTAL, CKMB, CKMBINDEX, TROPONINI in the last 168 hours. BNP (last 3 results) No results for input(s): PROBNP in the last 8760 hours. HbA1C: Recent Labs    06/10/20 0543  HGBA1C 8.9*   CBG: Recent Labs  Lab 06/10/20 1619 06/10/20 2126 06/11/20 0336 06/11/20 0605 06/11/20 1131  GLUCAP 177* 121* 77 73 145*   Lipid Profile: Recent Labs    06/10/20 0543  CHOL 133  HDL 54  LDLCALC 56  TRIG 113  CHOLHDL 2.5   Thyroid Function Tests: Recent Labs    06/09/20 1635  TSH 0.623   Anemia Panel: No results for input(s): VITAMINB12, FOLATE, FERRITIN,  TIBC, IRON, RETICCTPCT in the last 72 hours. Sepsis Labs: No results for input(s): PROCALCITON, LATICACIDVEN in the last 168 hours.  Recent Results (from the past 240 hour(s))  SARS CORONAVIRUS 2 (TAT 6-24 HRS) Nasopharyngeal Nasopharyngeal Swab     Status: None   Collection Time: 06/09/20  9:15 AM   Specimen: Nasopharyngeal Swab  Result Value Ref Range Status   SARS Coronavirus 2 NEGATIVE NEGATIVE Final    Comment: (NOTE) SARS-CoV-2 target nucleic acids are NOT DETECTED.  The SARS-CoV-2 RNA is generally detectable in upper and lower respiratory specimens during the acute phase of infection. Negative results do not preclude SARS-CoV-2 infection, do not rule out co-infections with other pathogens, and should not be used as the sole basis for treatment or other patient management decisions. Negative results must be combined with clinical observations, patient history, and epidemiological information. The expected result is Negative.  Fact Sheet for Patients: SugarRoll.be  Fact Sheet for Healthcare Providers: https://www.woods-mathews.com/  This test is not yet approved or cleared by the [place] FDA and  has been authorized for detection and/or  diagnosis of SARS-CoV-2 by FDA under an Emergency Use Authorization (EUA). This EUA will remain  in effect (meaning this test can be used) for the duration of the COVID-19 declaration under Se ction 564(b)(1) of the Act, 21 U.S.C. section 360bbb-3(b)(1), unless the authorization is terminated or revoked sooner.  Performed at [facility], [place] [address], [place], [place] [address]          Radiology Studies: MR BRAIN W CONTRAST  Result Date: 06/09/2020 CLINICAL DATA:  Brain mass or lesion. MRI of the brain earlier today showing focus of restricted diffusion within the pons concerning for infarct versus active demyelination. EXAM: MRI HEAD WITH CONTRAST TECHNIQUE: Multiplanar,  multiecho pulse sequences of the brain and surrounding structures were obtained with intravenous contrast. CONTRAST:  31m GADAVIST GADOBUTROL 1 MMOL/ML IV SOLN COMPARISON:  MRI of the brain without contrast June 09, 2020. FINDINGS: Limited study with only T1 postcontrast images and T2 coronal. No focus of abnormal contrast enhancement associated with the T2 hyperintense white matter lesions or correlating with focus of restricted diffusion within the pons described on prior MRI to suggest active demyelination. Therefore, focus of restricted diffusion seen on prior MRI likely represents a small acute ischemic insult. IMPRESSION: No focus of abnormal contrast enhancement. Focus of restricted diffusion within the pons seen on prior MRI likely represents a small acute ischemic insult. Electronically Signed   By: KPedro EarlsM.D.   On: 06/09/2020 14:19   ECHOCARDIOGRAM COMPLETE  Result Date: 06/11/2020    ECHOCARDIOGRAM REPORT   Patient Name:   Hannah BERTHELOTDate of Exam: 06/11/2020 Medical Rec #:  [id number]   Height:       62.0 in Accession #:    [id number]  Weight:       147.0 lb Date of Birth:  104-07-1965  BSA:          1.677 m Patient Age:    89years     BP:           154/81 mmHg Patient Gender: F            HR:           68 bpm. Exam Location:  Inpatient Procedure: 2D Echo, Cardiac Doppler and Color Doppler Indications:    Stroke 434.91 / I163.9  History:        Patient has no prior history of Echocardiogram examinations.                 Stroke; Risk Factors:Hypertension, Diabetes, Dyslipidemia and                 Non-Smoker.  Sonographer:    JVickie EpleyRDCS Referring Phys: 2[place] 1. Left ventricular ejection fraction, by estimation, is 60 to 65%. The left ventricle has normal function. The left ventricle has no regional wall motion abnormalities. Left ventricular diastolic parameters are consistent with Grade II diastolic dysfunction (pseudonormalization).  Elevated left ventricular end-diastolic pressure.  2. Right ventricular systolic function is normal. The right ventricular size is normal.  3. The pericardial effusion is posterior to the left ventricle.  4. The mitral valve is normal in structure. Trivial mitral valve regurgitation. No evidence of mitral stenosis.  5. The aortic valve is normal in structure. Aortic valve regurgitation is trivial. No aortic stenosis is present.  6. The inferior vena cava is normal in size with greater than 50% respiratory variability, suggesting right atrial pressure of 3  mmHg. FINDINGS  Left Ventricle: Left ventricular ejection fraction, by estimation, is 60 to 65%. The left ventricle has normal function. The left ventricle has no regional wall motion abnormalities. The left ventricular internal cavity size was normal in size. There is  no left ventricular hypertrophy. Left ventricular diastolic parameters are consistent with Grade II diastolic dysfunction (pseudonormalization). Elevated left ventricular end-diastolic pressure. Right Ventricle: The right ventricular size is normal. No increase in right ventricular wall thickness. Right ventricular systolic function is normal. Left Atrium: Left atrial size was normal in size. Right Atrium: Right atrial size was normal in size. Pericardium: Trivial pericardial effusion is present. The pericardial effusion is posterior to the left ventricle. Mitral Valve: The mitral valve is normal in structure. Trivial mitral valve regurgitation. No evidence of mitral valve stenosis. Tricuspid Valve: The tricuspid valve is normal in structure. Tricuspid valve regurgitation is trivial. No evidence of tricuspid stenosis. Aortic Valve: The aortic valve is normal in structure. Aortic valve regurgitation is trivial. No aortic stenosis is present. Pulmonic Valve: The pulmonic valve was normal in structure. Pulmonic valve regurgitation is not visualized. No evidence of pulmonic stenosis. Aorta: The aortic  root is normal in size and structure. Venous: The inferior vena cava is normal in size with greater than 50% respiratory variability, suggesting right atrial pressure of 3 mmHg. IAS/Shunts: No atrial level shunt detected by color flow Doppler.  LEFT VENTRICLE PLAX 2D LVIDd:         3.50 cm     Diastology LVIDs:         2.70 cm     LV e' medial:    3.75 cm/s LV PW:         0.80 cm     LV E/e' medial:  19.1 LV IVS:        0.80 cm     LV e' lateral:   4.13 cm/s LVOT diam:     1.90 cm     LV E/e' lateral: 17.3 LV SV:         51 LV SV Index:   31 LVOT Area:     2.84 cm  LV Volumes (MOD) LV vol d, MOD A2C: 77.4 ml LV vol d, MOD A4C: 86.3 ml LV vol s, MOD A2C: 31.0 ml LV vol s, MOD A4C: 38.0 ml LV SV MOD A2C:     46.4 ml LV SV MOD A4C:     86.3 ml LV SV MOD BP:      49.8 ml RIGHT VENTRICLE RV S prime:     10.10 cm/s TAPSE (M-mode): 1.8 cm LEFT ATRIUM           Index       RIGHT ATRIUM          Index LA diam:      2.90 cm 1.73 cm/m  RA Area:     7.56 cm LA Vol (A2C): 25.2 ml 15.02 ml/m RA Volume:   13.00 ml 7.75 ml/m LA Vol (A4C): 28.8 ml 17.17 ml/m  AORTIC VALVE LVOT Vmax:   84.90 cm/s LVOT Vmean:  60.200 cm/s LVOT VTI:    0.181 m  AORTA Ao Root diam: 3.00 cm MITRAL VALVE MV Area (PHT): 2.91 cm    SHUNTS MV Decel Time: 261 msec    Systemic VTI:  0.18 m MV E velocity: 71.60 cm/s  Systemic Diam: 1.90 cm MV A velocity: 94.30 cm/s MV E/A ratio:  0.76 Jenkins Rouge MD Electronically signed by Jenkins Rouge MD Signature Date/Time:  06/11/2020/10:19:14 AM    Final    VAS US CAROTID  Result Date: 06/10/2020 Carotid Arterial Duplex Study Indications:       CVA. Risk Factors:      Hypertension, hyperlipidemia, Diabetes. Comparison Study:  No prior studies. Performing Technologist: Oliver Hum RVT  Examination Guidelines: A complete evaluation includes B-mode imaging, spectral Doppler, color Doppler, and power Doppler as needed of all accessible portions of each vessel. Bilateral testing is considered an integral part of a  complete examination. Limited examinations for reoccurring indications may be performed as noted.  Right Carotid Findings: +----------+--------+--------+--------+-----------------------+--------+           PSV cm/sEDV cm/sStenosisPlaque Description     Comments +----------+--------+--------+--------+-----------------------+--------+ CCA Prox  50      7               smooth and heterogenous         +----------+--------+--------+--------+-----------------------+--------+ CCA Distal79      15              smooth and heterogenous         +----------+--------+--------+--------+-----------------------+--------+ ICA Prox  81      24              smooth and heterogenous         +----------+--------+--------+--------+-----------------------+--------+ ICA Distal61      17                                     tortuous +----------+--------+--------+--------+-----------------------+--------+ ECA       91      9                                               +----------+--------+--------+--------+-----------------------+--------+ +----------+--------+-------+--------+-------------------+           PSV cm/sEDV cmsDescribeArm Pressure (mmHG) +----------+--------+-------+--------+-------------------+ [id number]                                        +----------+--------+-------+--------+-------------------+ +---------+--------+--+--------+--+---------+ VertebralPSV cm/s48EDV cm/s11Antegrade +---------+--------+--+--------+--+---------+  Left Carotid Findings: +----------+--------+--------+--------+-----------------------+--------+           PSV cm/sEDV cm/sStenosisPlaque Description     Comments +----------+--------+--------+--------+-----------------------+--------+ CCA Prox  77      13              smooth and heterogenous         +----------+--------+--------+--------+-----------------------+--------+ CCA Distal66      12              smooth and  heterogenous         +----------+--------+--------+--------+-----------------------+--------+ ICA Prox  84      27              smooth and heterogenoustortuous +----------+--------+--------+--------+-----------------------+--------+ ICA Distal69      20                                     tortuous +----------+--------+--------+--------+-----------------------+--------+ ECA       66      10                                              +----------+--------+--------+--------+-----------------------+--------+ +----------+--------+--------+--------+-------------------+  PSV cm/sEDV cm/sDescribeArm Pressure (mmHG) +----------+--------+--------+--------+-------------------+ Subclavian129                                         +----------+--------+--------+--------+-------------------+ +---------+--------+--+--------+-+---------+ VertebralPSV cm/s44EDV cm/s7Antegrade +---------+--------+--+--------+-+---------+   Summary: Right Carotid: Velocities in the right ICA are consistent with a 1-39% stenosis. Left Carotid: Velocities in the left ICA are consistent with a 1-39% stenosis. Vertebrals: Bilateral vertebral arteries demonstrate antegrade flow. *See table(s) above for measurements and observations.  Electronically signed by Deitra Mayo MD on 06/10/2020 at 4:13:23 PM.    Final         Scheduled Meds: . amLODipine  10 mg Oral Daily  . aspirin EC  81 mg Oral Daily  . clopidogrel  75 mg Oral Daily  . enoxaparin (LOVENOX) injection  30 mg Subcutaneous Q24H  . famotidine  40 mg Oral Daily  . feeding supplement  237 mL Oral BID BM  . FLUoxetine  20 mg Oral Daily  . insulin aspart  0-15 Units Subcutaneous TID WC  . insulin aspart  0-5 Units Subcutaneous QHS  . insulin glargine  35 Units Subcutaneous QHS  . metoprolol tartrate  100 mg Oral BID  . rosuvastatin  20 mg Oral Daily   Continuous Infusions: . sodium chloride 75 mL/hr at 06/10/20 1641      LOS: 2 days    Time spent: 25 mins.More than 50% of that time was spent in counseling and/or coordination of care.      Shelly Coss, MD [facility] P1/26/2022, 11:35 AM

## 2020-06-11 NOTE — Progress Notes (Addendum)
Inpatient Rehabilitation Admissions Coordinator   I have notified Dr. Tawanna Solo and acute team that Hannah [name] has requested peer to peer with treating MD by 9 am EST 1/27 to discuss request for Cir. [phone] option #5 for peer to peer.  Danne Baxter, RN, MSN Rehab Admissions Coordinator [phone] 06/11/2020 1:37 PM  I met at bedside with patient as I spoke with her daughter, Hannah [name], by phone. We discussed goals, expectations and cost of Cir. They are in agreement to admit. I await final determination from [facility] to possibly admit tomorrow.  Danne Baxter, RN, MSN Rehab Admissions Coordinator [phone] 06/11/2020 3:49 PM   I have received approval from payor for CIR admit.  Danne Baxter, RN, MSN Rehab Admissions Coordinator [phone] 06/11/2020 7:52 PM

## 2020-06-11 NOTE — Progress Notes (Signed)
  Echocardiogram 2D Echocardiogram has been performed.  Michiel Cowboy 06/11/2020, 10:19 AM

## 2020-06-11 NOTE — Progress Notes (Signed)
Physical Therapy Treatment Patient Details Name: Hannah [name] MRN: [id number] DOB: 11/26/1965 Today's Date: 06/11/2020    History of Present Illness Hannah [name] is a 55 y.o. female with medical history significant of CVA with resultant L hemiparesis; MS; HTN; HLD; CKD and DM presenting with R sided weakness, vision changes, and dyphasia.    PT Comments    Pt eager to work with PT and eager to go to CIR. Pt explained there had to be a bed available and pending insurance authorization with determine if/when she can go to CIR. Pt and daughter aware. Pt with R spastic hemiparesis requiring modA for transfers and modAx2 for ambulation with RW. Pt was amb with RW and cane PTA. Pt to strongly benefit from CIR upon d/c to achieve maximal functional recovery as pt was supervision level PTA and is very motivated to work with therapy. Pt demo's excellent rehab potential. Acute PT to cont to follow.     Follow Up Recommendations  CIR     Equipment Recommendations  None recommended by PT    Recommendations for Other Services Rehab consult     Precautions / Restrictions Precautions Precautions: Fall Precaution Comments: R spastic hemiparesis in UE/LE Restrictions Weight Bearing Restrictions: No    Mobility  Bed Mobility Overal bed mobility: Needs Assistance Bed Mobility: Rolling;Sidelying to Sit Rolling: Mod assist Sidelying to sit: Mod assist       General bed mobility comments: pt with limited use of R UE functionally to assist with rolling, hand over hand assist to reach across with R UE for L bedrail, modA to complete roll and then for trunk elevation to EOB  Transfers Overall transfer level: Needs assistance Equipment used: Rolling walker (2 wheeled) Transfers: Sit to/from Stand Sit to Stand: Mod assist         General transfer comment: modA to power up and maintain balance during transition of hands, assist for R UE placement onto walker, completed 3 sit to stand  transfers  Ambulation/Gait Ambulation/Gait assistance: Mod assist;+2 physical assistance;+2 safety/equipment Gait Distance (Feet): 6 Feet (x1, 15x1) Assistive device: Rolling walker (2 wheeled) Gait Pattern/deviations: Step-through pattern;Decreased stride length;Decreased weight shift to right;Decreased weight shift to left;Decreased dorsiflexion - right;Ataxic;Narrow base of support Gait velocity: dec   General Gait Details: pt with with anterior pitch forward and very spastic, ataxic like gait pattern. PT provided posterior trunk support via hands under pt's arm pits to front of shoulders to assist with trunk extension and stability, tactile cues for weight shifting and modA to advance R LE during swing phase, occasionally able to complete without assist. PT tech assist with walker management and chair follow   Stairs             Wheelchair Mobility    Modified Rankin (Stroke Patients Only)       Balance Overall balance assessment: Needs assistance Sitting-balance support: Single extremity supported;Feet supported Sitting balance-Leahy Scale: Fair Sitting balance - Comments: unable to complete dynamic task of donning socks without modA to maintain balance   Standing balance support: Bilateral upper extremity supported Standing balance-Leahy Scale: Zero Standing balance comment: dependent on RW/external support                            Cognition Arousal/Alertness: Awake/alert Behavior During Therapy: WFL for tasks assessed/performed Overall Cognitive Status: Within Functional Limits for tasks assessed  General Comments: able to follow all commands, A&Ox4, good insight to deficits and safety, difficulty sequencing due to vision impairment and co-ordination deficits      Exercises      General Comments General comments (skin integrity, edema, etc.): VSS, pt with R spastic hemiparesis, modA to don bilat socks,  pt with more difficulty donning R sock      Pertinent Vitals/Pain Pain Assessment: No/denies pain    Home Living                      Prior Function            PT Goals (current goals can now be found in the care plan section) Acute Rehab PT Goals Patient Stated Goal: get stronger PT Goal Formulation: With patient Time For Goal Achievement: 06/23/20 Potential to Achieve Goals: Good Progress towards PT goals: Progressing toward goals    Frequency    Min 4X/week      PT Plan Current plan remains appropriate    Co-evaluation              AM-PAC PT "6 Clicks" Mobility   Outcome Measure  Help needed turning from your back to your side while in a flat bed without using bedrails?: A Lot Help needed moving from lying on your back to sitting on the side of a flat bed without using bedrails?: A Lot Help needed moving to and from a bed to a chair (including a wheelchair)?: A Lot Help needed standing up from a chair using your arms (e.g., wheelchair or bedside chair)?: A Lot Help needed to walk in hospital room?: A Lot Help needed climbing 3-5 steps with a railing? : Total 6 Click Score: 11    End of Session Equipment Utilized During Treatment: Gait belt Activity Tolerance: Patient tolerated treatment well;Treatment limited secondary to medical complications (Comment) Patient left: in chair;with call bell/phone within reach;with family/visitor present (daughter) Nurse Communication: Mobility status PT Visit Diagnosis: Unsteadiness on feet (R26.81);Muscle weakness (generalized) (M62.81);Difficulty in walking, not elsewhere classified (R26.2) Hemiplegia - Right/Left: Right Hemiplegia - dominant/non-dominant: Dominant Hemiplegia - caused by: Unspecified     Time: [id number] PT Time Calculation (min) (ACUTE ONLY): 26 min  Charges:  $Gait Training: 8-22 mins $Therapeutic Activity: 8-22 mins                     Hannah [name], PT, DPT Acute Rehabilitation  Services Pager #: [phone] Office #: [phone]    Hannah [name] 06/11/2020, 11:23 AM

## 2020-06-11 NOTE — Progress Notes (Signed)
Inpatient Rehabilitation Admissions Coordinator  I will meet with patient today and begin insurance authorization with [place] for a possible CIR admit.  Danne Baxter, RN, MSN Rehab Admissions Coordinator [phone] 06/11/2020 8:36 AM

## 2020-06-11 NOTE — PMR Pre-admission (Signed)
PMR Admission Coordinator Pre-Admission Assessment  Patient: Hannah [name] is an 55 y.o., female MRN: [id number] DOB: Dec 20, 1965 Height: '5\' 2"'$  (157.5 cm) Weight: 59.9 kg (stated)              Insurance Information HMO: yes    PPO:      PCP:      IPA:      80/20:      OTHER:  PRIMARY: U[employer] Medicare      Policy#: [id number]      Subscriber: pt CM Name: [name]      Phone#: [phone] option #8     Fax#: [phone] Pre-Cert#: [id number]  Approved for 7 days    Employer:  Benefits:  Phone #: [phone]     Name: 1/26 Eff. Date: 05/17/2020     Deduct: none      Out of Pocket Max: $4500      Life Max: none  CIR: $325 co pay per day days 1 until 5      SNF: no copay days 1 until 20; $188 co pay per day days 21 until 45; no copay days 46 until 100 Outpatient: $30 per visit     Co-Pay: visits per medical neccesity Home Health: 100%      Co-Pay: visits per medical neccesity DME: 38     Co-Pay: 20% Providers: in network  SECONDARY: none      Policy#:       Phone#:   Development worker, community:       Phone#:   [employer]" for patients in Inpatient Rehabilitation Facilities with attached "Privacy Act [place] Records" was provided and verbally reviewed with: Patient and Family  Emergency Contact Information Contact Information    Name Relation Home Work [place], [name] Daughter   [phone]   [name] Daughter   [phone]     Current Medical History  Patient Admitting Diagnosis: CVA  History of Present Illness: 55 year old right-handed female with history of hyperlipidemia, CKD stage IV, multiple sclerosis, diabetes mellitus, hypertension, multiple CVA with right side residual weakness and slurred speech maintained on aspirin and Plavix.  Patient reportedly has missed multiple medical follow-up appointments.  Presented 06/09/2020 with dizziness and vision change as well as left-sided weakness.  Blood pressure 208/102.  Cranial CT scan  negative for acute changes.  MRI showed punctate focus of restricted diffusion within the dorsal pons on the right side.  Scattered and confluent foci of T2 hyperintensity also seen within the white matter of the cerebral hemispheres and pons, nonspecific consistent with clinical diagnosis of multiple sclerosis with multiple new lesions.  MRA unremarkable.  Admission chemistries glucose 161 BUN 39 creatinine 4.11 with noted creatinine from February 2021 2.81, alcohol negative.  Patient did not receive TPA.  Currently maintained on aspirin and Plavix for CVA prophylaxis.  Subcutaneous Lovenox for DVT prophylaxis.    Complete NIHSS TOTAL: 7    Past Medical History  Past Medical History:  Diagnosis Date  . Cerebral infarction ([place]) 07/12/2017   2009 first one, has had a total of 4 stokes  . Diabetes type 2, controlled ([place])   . Hemiparesis affecting right side as late effect of cerebrovascular accident (CVA) ([place])   . Hyperlipidemia   . Hypertension   . Multiple sclerosis ([place]) 2009  . Stroke [facility])     Family History  family history is not on file.  Prior Rehab/Hospitalizations:  Has the patient had prior rehab or hospitalizations prior to admission? Yes  Has the patient had major surgery during 100 days prior to admission? No  Current Medications   Current Facility-Administered Medications:  .  0.9 %  sodium chloride infusion, , Intravenous, Continuous, Adhikari, Amrit, MD, Last Rate: 75 mL/hr at 06/11/20 1419, New Bag at 06/11/20 1419 .  acetaminophen (TYLENOL) tablet 650 mg, 650 mg, Oral, Q4H PRN, 650 mg at 06/09/20 2016 **OR** acetaminophen (TYLENOL) 160 MG/5ML solution 650 mg, 650 mg, Per Tube, Q4H PRN **OR** acetaminophen (TYLENOL) suppository 650 mg, 650 mg, Rectal, Q4H PRN, Karmen Bongo, MD .  amLODipine (NORVASC) tablet 10 mg, 10 mg, Oral, Daily, Ghimire, Kuber, MD, 10 mg at 06/11/20 1029 .  aspirin EC tablet 81 mg, 81 mg, Oral, Daily, Bhagat, Srishti L, MD, 81 mg at 06/11/20  1029 .  clopidogrel (PLAVIX) tablet 75 mg, 75 mg, Oral, Daily, Bhagat, Srishti L, MD, 75 mg at 06/11/20 1029 .  [START ON 06/12/2020] famotidine (PEPCID) tablet 20 mg, 20 mg, Oral, Daily, Adhikari, Amrit, MD .  feeding supplement (ENSURE ENLIVE / ENSURE PLUS) liquid 237 mL, 237 mL, Oral, BID BM, Barb Merino, MD, 237 mL at 06/10/20 1635 .  FLUoxetine (PROZAC) capsule 20 mg, 20 mg, Oral, Daily, Karmen Bongo, MD, 20 mg at 06/11/20 1029 .  heparin injection 5,000 Units, 5,000 Units, Subcutaneous, Q8H, Adhikari, Amrit, MD, 5,000 Units at 06/11/20 1215 .  hydrALAZINE (APRESOLINE) injection 10 mg, 10 mg, Intravenous, Q6H PRN, Adhikari, Amrit, MD .  insulin aspart (novoLOG) injection 0-15 Units, 0-15 Units, Subcutaneous, TID WC, Karmen Bongo, MD, 2 Units at 06/11/20 1639 .  insulin aspart (novoLOG) injection 0-5 Units, 0-5 Units, Subcutaneous, QHS, Karmen Bongo, MD .  insulin glargine (LANTUS) injection 35 Units, 35 Units, Subcutaneous, Ivery Quale, MD, 35 Units at 06/10/20 2225 .  metoprolol tartrate (LOPRESSOR) tablet 100 mg, 100 mg, Oral, BID, Barb Merino, MD, 100 mg at 06/11/20 1029 .  nitroGLYCERIN (NITROSTAT) SL tablet 0.4 mg, 0.4 mg, Sublingual, Q5 min PRN, Barb Merino, MD .  rosuvastatin (CRESTOR) tablet 20 mg, 20 mg, Oral, Daily, Karmen Bongo, MD, 20 mg at 06/11/20 1030 .  senna-docusate (Senokot-S) tablet 1 tablet, 1 tablet, Oral, QHS PRN, Karmen Bongo, MD .  zolpidem (AMBIEN) tablet 5 mg, 5 mg, Oral, QHS PRN, Shelly Coss, MD  Patients Current Diet:  Diet Order            Diet heart healthy/carb modified Room service appropriate? Yes; Fluid consistency: Thin  Diet effective ____                 Precautions / Restrictions Precautions Precautions: Fall Precaution Comments: R spastic hemiparesis in UE/LE Restrictions Weight Bearing Restrictions: No   Has the patient had 2 or more falls or a fall with injury in the past year?No  Prior Activity  Level Limited Community (1-2x/wk): supervision with RW assisted with adls  Prior Functional Level Prior Function Level of Independence: Needs assistance Gait / Transfers Assistance Needed: rollstor with assist ADL's / Homemaking Assistance Needed: famly assist with self care  Self Care: Did the patient need help bathing, dressing, using the toilet or eating?  Needed some help  Indoor Mobility: Did the patient need assistance with walking from room to room (with or without device)? Independent  Stairs: Did the patient need assistance with internal or external stairs (with or without device)? Needed some help  Functional Cognition: Did the patient need help planning regular tasks such as shopping or remembering to take medications? Needed some help  Home Assistive  Devices / [place] Devices/Equipment: Blood pressure cuff,CBG Meter,Eyeglasses Home Equipment: Walker - 4 wheels,Cane - single point  Prior Device Use: Indicate devices/aids used by the patient prior to current illness, exacerbation or injury? Walker  Current Functional Level Cognition  Overall Cognitive Status: Within Functional Limits for tasks assessed Orientation Level: Oriented X4 General Comments: able to follow all commands, A&Ox4, good insight to deficits and safety, difficulty sequencing due to vision impairment and co-ordination deficits    Extremity Assessment (includes Sensation/Coordination)  Upper Extremity Assessment: Generalized weakness,RUE deficits/detail,LUE deficits/detail RUE Deficits / Details: 3+/5 MM grade shoulder through digits LUE Deficits / Details: 4-/5 MM grade shoulder through digits  Lower Extremity Assessment: Generalized weakness    ADLs  Overall ADL's : Needs assistance/impaired Eating/Feeding: Set up,Sitting Grooming: Set up,Sitting Upper Body Bathing: Minimal assistance,Sitting Lower Body Bathing: Moderate assistance,Sitting/lateral leans,Sit to/from stand Upper  Body Dressing : Minimal assistance,Sitting Lower Body Dressing: Moderate assistance,Sitting/lateral leans,Sit to/from stand Toilet Transfer: Moderate assistance,RW,Stand-pivot Toileting- Clothing Manipulation and Hygiene: Moderate assistance,Sit to/from stand,Sitting/lateral lean Functional mobility during ADLs: Moderate assistance,Rolling walker,Cueing for safety General ADL Comments: Pt limited by decreased strength, decreased ability to care for self and decreased activity tolerance.    Mobility  Overal bed mobility: Needs Assistance Bed Mobility: Rolling,Sidelying to Sit Rolling: Mod assist Sidelying to sit: Mod assist Supine to sit: Mod assist Sit to supine: Max assist General bed mobility comments: pt with limited use of R UE functionally to assist with rolling, hand over hand assist to reach across with R UE for L bedrail, modA to complete roll and then for trunk elevation to EOB    Transfers  Overall transfer level: Needs assistance Equipment used: Rolling walker (2 wheeled) Transfers: Sit to/from Stand Sit to Stand: Mod assist Stand pivot transfers: Mod assist General transfer comment: modA to power up and maintain balance during transition of hands, assist for R UE placement onto walker, completed 3 sit to stand transfers    Ambulation / Gait / Stairs / Wheelchair Mobility  Ambulation/Gait Ambulation/Gait assistance: Mod assist,+2 physical assistance,+2 safety/equipment Gait Distance (Feet): 6 Feet (x1, 15x1) Assistive device: Rolling walker (2 wheeled) Gait Pattern/deviations: Step-through pattern,Decreased stride length,Decreased weight shift to right,Decreased weight shift to left,Decreased dorsiflexion - right,Ataxic,Narrow base of support General Gait Details: pt with with anterior pitch forward and very spastic, ataxic like gait pattern. PT provided posterior trunk support via hands under pt's arm pits to front of shoulders to assist with trunk extension and stability,  tactile cues for weight shifting and modA to advance R LE during swing phase, occasionally able to complete without assist. PT tech assist with walker management and chair follow Gait velocity: dec    Posture / Balance Dynamic Sitting Balance Sitting balance - Comments: unable to complete dynamic task of donning socks without modA to maintain balance Balance Overall balance assessment: Needs assistance Sitting-balance support: Single extremity supported,Feet supported Sitting balance-Leahy Scale: Fair Sitting balance - Comments: unable to complete dynamic task of donning socks without modA to maintain balance Standing balance support: Bilateral upper extremity supported Standing balance-Leahy Scale: Zero Standing balance comment: dependent on RW/external support    Special needs/care consideration Daughter, Vella Redhead to be the designated visitor Hgb A1c 8.9     Previous Home Environment  Living Arrangements:  (2 adult daughters)  Lives With: Daughter (two adult daughters) Available Help at Discharge: Family,Friend(s),Available 24 hours/day Type of Home: House Home Layout: One level Home Access: Stairs to enter [employer] Shower/Tub: Chiropodist: Standard  Bathroom Accessibility: Yes How Accessible: Accessible via walker Home Care Services: No  Discharge Living Setting Plans for Discharge Living Setting: Patient's home,Lives with (comment) (2 adult daughters) Type of Home at Discharge: House Discharge Home Layout: One level Discharge Home Access: Stairs to enter Discharge Bathroom Shower/Tub: Tub/shower unit Discharge Bathroom Toilet: Standard Discharge Bathroom Accessibility: Yes How Accessible: Accessible via walker Does the patient have any problems obtaining your medications?: No  Social/Family/Support Systems Contact Information: two daughters, mainly [place] Anticipated Caregiver: [name] and her sister Anticipated Ambulance person Information: see  above Caregiver Availability: 24/7 Discharge Plan Discussed with Primary Caregiver: Yes Is Caregiver In Agreement with Plan?: Yes Does Caregiver/Family have Issues with Lodging/Transportation while Pt is in Rehab?: No  Goals Patient/Family Goal for Rehab: supervision to min asisst with PT, OT and SLP Expected length of stay: 2 to 3 weeks Pt/Family Agrees to Admission and willing to participate: Yes Program Orientation Provided & Reviewed with Pt/Caregiver Including Roles  & Responsibilities: Yes  Decrease burden of Care through IP rehab admission: n/a  Possible need for SNF placement upon discharge:not anticipated  Patient Condition: This patient's condition remains as documented in the consult dated 06/11/2020, in which the Rehabilitation Physician determined and documented that the patient's condition is appropriate for intensive rehabilitative care in an inpatient rehabilitation facility. Will admit to inpatient rehab today.  Preadmission Screen Completed By:  Cleatrice Burke, RN, 06/11/2020 8:13 PM ______________________________________________________________________   Discussed status with Dr. Naaman Plummer on 06/12/2020 at  1115 and received approval for admission today.  Admission Coordinator:  Cleatrice Burke, time [id number] Date 06/12/2020

## 2020-06-12 ENCOUNTER — Inpatient Hospital Stay (HOSPITAL_COMMUNITY)
Admission: RE | Admit: 2020-06-12 | Discharge: 2020-06-26 | DRG: 057 | Disposition: A | Discharge status: Home Health | Payer: Medicare Other | Source: Intra-hospital | Attending: Physical Medicine & Rehabilitation | Admitting: Physical Medicine & Rehabilitation

## 2020-06-12 ENCOUNTER — Other Ambulatory Visit: Payer: Self-pay

## 2020-06-12 ENCOUNTER — Encounter (HOSPITAL_COMMUNITY): Payer: Self-pay | Admitting: Physical Medicine & Rehabilitation

## 2020-06-12 DIAGNOSIS — G4700 Insomnia, unspecified: Secondary | ICD-10-CM | POA: Diagnosis not present

## 2020-06-12 DIAGNOSIS — E1165 Type 2 diabetes mellitus with hyperglycemia: Secondary | ICD-10-CM | POA: Diagnosis not present

## 2020-06-12 DIAGNOSIS — K219 Gastro-esophageal reflux disease without esophagitis: Secondary | ICD-10-CM | POA: Diagnosis present

## 2020-06-12 DIAGNOSIS — I69392 Facial weakness following cerebral infarction: Secondary | ICD-10-CM

## 2020-06-12 DIAGNOSIS — K5900 Constipation, unspecified: Secondary | ICD-10-CM | POA: Diagnosis present

## 2020-06-12 DIAGNOSIS — E669 Obesity, unspecified: Secondary | ICD-10-CM

## 2020-06-12 DIAGNOSIS — I10 Essential (primary) hypertension: Secondary | ICD-10-CM

## 2020-06-12 DIAGNOSIS — Z887 Allergy status to serum and vaccine status: Secondary | ICD-10-CM

## 2020-06-12 DIAGNOSIS — E119 Type 2 diabetes mellitus without complications: Secondary | ICD-10-CM

## 2020-06-12 DIAGNOSIS — Z7902 Long term (current) use of antithrombotics/antiplatelets: Secondary | ICD-10-CM | POA: Diagnosis not present

## 2020-06-12 DIAGNOSIS — E785 Hyperlipidemia, unspecified: Secondary | ICD-10-CM | POA: Diagnosis present

## 2020-06-12 DIAGNOSIS — Z886 Allergy status to analgesic agent status: Secondary | ICD-10-CM | POA: Diagnosis not present

## 2020-06-12 DIAGNOSIS — Z79899 Other long term (current) drug therapy: Secondary | ICD-10-CM

## 2020-06-12 DIAGNOSIS — E11649 Type 2 diabetes mellitus with hypoglycemia without coma: Secondary | ICD-10-CM | POA: Diagnosis not present

## 2020-06-12 DIAGNOSIS — Z9119 Patient's noncompliance with other medical treatment and regimen: Secondary | ICD-10-CM | POA: Diagnosis not present

## 2020-06-12 DIAGNOSIS — D631 Anemia in chronic kidney disease: Secondary | ICD-10-CM | POA: Diagnosis present

## 2020-06-12 DIAGNOSIS — E1122 Type 2 diabetes mellitus with diabetic chronic kidney disease: Secondary | ICD-10-CM | POA: Diagnosis present

## 2020-06-12 DIAGNOSIS — I69354 Hemiplegia and hemiparesis following cerebral infarction affecting left non-dominant side: Principal | ICD-10-CM

## 2020-06-12 DIAGNOSIS — Z885 Allergy status to narcotic agent status: Secondary | ICD-10-CM

## 2020-06-12 DIAGNOSIS — Z7984 Long term (current) use of oral hypoglycemic drugs: Secondary | ICD-10-CM | POA: Diagnosis not present

## 2020-06-12 DIAGNOSIS — D62 Acute posthemorrhagic anemia: Secondary | ICD-10-CM

## 2020-06-12 DIAGNOSIS — G939 Disorder of brain, unspecified: Secondary | ICD-10-CM | POA: Diagnosis not present

## 2020-06-12 DIAGNOSIS — I69328 Other speech and language deficits following cerebral infarction: Secondary | ICD-10-CM | POA: Diagnosis not present

## 2020-06-12 DIAGNOSIS — G35 Multiple sclerosis: Secondary | ICD-10-CM | POA: Diagnosis present

## 2020-06-12 DIAGNOSIS — I6350 Cerebral infarction due to unspecified occlusion or stenosis of unspecified cerebral artery: Secondary | ICD-10-CM | POA: Diagnosis present

## 2020-06-12 DIAGNOSIS — Z9109 Other allergy status, other than to drugs and biological substances: Secondary | ICD-10-CM | POA: Diagnosis not present

## 2020-06-12 DIAGNOSIS — I69351 Hemiplegia and hemiparesis following cerebral infarction affecting right dominant side: Secondary | ICD-10-CM

## 2020-06-12 DIAGNOSIS — Z955 Presence of coronary angioplasty implant and graft: Secondary | ICD-10-CM | POA: Diagnosis not present

## 2020-06-12 DIAGNOSIS — E1169 Type 2 diabetes mellitus with other specified complication: Secondary | ICD-10-CM

## 2020-06-12 DIAGNOSIS — N184 Chronic kidney disease, stage 4 (severe): Secondary | ICD-10-CM | POA: Diagnosis not present

## 2020-06-12 DIAGNOSIS — I129 Hypertensive chronic kidney disease with stage 1 through stage 4 chronic kidney disease, or unspecified chronic kidney disease: Secondary | ICD-10-CM | POA: Diagnosis present

## 2020-06-12 DIAGNOSIS — R2990 Unspecified symptoms and signs involving the nervous system: Secondary | ICD-10-CM | POA: Diagnosis not present

## 2020-06-12 DIAGNOSIS — Z794 Long term (current) use of insulin: Secondary | ICD-10-CM

## 2020-06-12 LAB — CBC
HCT: 29.5 % — ABNORMAL LOW (ref 36.0–46.0)
Hemoglobin: 9.6 g/dL — ABNORMAL LOW (ref 12.0–15.0)
MCH: 28 pg (ref 26.0–34.0)
MCHC: 32.5 g/dL (ref 30.0–36.0)
MCV: 86 fL (ref 80.0–100.0)
Platelets: 238 10*3/uL (ref 150–400)
RBC: 3.43 MIL/uL — ABNORMAL LOW (ref 3.87–5.11)
RDW: 12.6 % (ref 11.5–15.5)
WBC: 7.9 10*3/uL (ref 4.0–10.5)
nRBC: 0 % (ref 0.0–0.2)

## 2020-06-12 LAB — BASIC METABOLIC PANEL
Anion gap: 9 (ref 5–15)
BUN: 31 mg/dL — ABNORMAL HIGH (ref 6–20)
CO2: 22 mmol/L (ref 22–32)
Calcium: 8.7 mg/dL — ABNORMAL LOW (ref 8.9–10.3)
Chloride: 109 mmol/L (ref 98–111)
Creatinine, Ser: 3.67 mg/dL — ABNORMAL HIGH (ref 0.44–1.00)
GFR, Estimated: 14 mL/min — ABNORMAL LOW (ref >60)
Glucose, Bld: 63 mg/dL — ABNORMAL LOW (ref 70–99)
Potassium: 3.6 mmol/L (ref 3.5–5.1)
Sodium: 140 mmol/L (ref 135–145)

## 2020-06-12 LAB — GLUCOSE, CAPILLARY
Glucose-Capillary: 46 mg/dL — ABNORMAL LOW (ref 70–99)
Glucose-Capillary: 165 mg/dL — ABNORMAL HIGH (ref 70–99)
Glucose-Capillary: 53 mg/dL — ABNORMAL LOW (ref 70–99)
Glucose-Capillary: 133 mg/dL — ABNORMAL HIGH (ref 70–99)
Glucose-Capillary: 167 mg/dL — ABNORMAL HIGH (ref 70–99)
Glucose-Capillary: 106 mg/dL — ABNORMAL HIGH (ref 70–99)

## 2020-06-12 LAB — CREATININE, SERUM
Creatinine, Ser: 3.65 mg/dL — ABNORMAL HIGH (ref 0.44–1.00)
GFR, Estimated: 14 mL/min — ABNORMAL LOW (ref >60)

## 2020-06-12 MED ORDER — ROSUVASTATIN CALCIUM 20 MG PO TABS
20.0000 mg | ORAL_TABLET | Freq: Every day | ORAL | Status: DC
  Administered 2020-06-13 – 2020-06-26 (×14): 20 mg via ORAL
  Filled 2020-06-12 (×14): qty 1

## 2020-06-12 MED ORDER — HEPARIN SODIUM (PORCINE) 5000 UNIT/ML IJ SOLN: 5000.0000 [IU] | Freq: Three times a day (TID) | INTRAMUSCULAR | Status: DC

## 2020-06-12 MED ORDER — HEPARIN SODIUM (PORCINE) 5000 UNIT/ML IJ SOLN
5000.0000 [IU] | Freq: Three times a day (TID) | INTRAMUSCULAR | Status: DC
  Administered 2020-06-12 – 2020-06-20 (×25): 5000 [IU] via SUBCUTANEOUS
  Filled 2020-06-12 (×26): qty 1

## 2020-06-12 MED ORDER — ASPIRIN EC 81 MG PO TBEC
81.0000 mg | DELAYED_RELEASE_TABLET | Freq: Every day | ORAL | Status: DC
  Administered 2020-06-13 – 2020-06-26 (×14): 81 mg via ORAL
  Filled 2020-06-12 (×14): qty 1

## 2020-06-12 MED ORDER — INSULIN ASPART 100 UNIT/ML ~~LOC~~ SOLN
0.0000 [IU] | Freq: Three times a day (TID) | SUBCUTANEOUS | Status: DC
  Administered 2020-06-12: 3 [IU] via SUBCUTANEOUS
  Administered 2020-06-13: 2 [IU] via SUBCUTANEOUS
  Administered 2020-06-13: 5 [IU] via SUBCUTANEOUS
  Administered 2020-06-14 – 2020-06-15 (×2): 3 [IU] via SUBCUTANEOUS
  Administered 2020-06-16: 5 [IU] via SUBCUTANEOUS
  Administered 2020-06-16: 2 [IU] via SUBCUTANEOUS
  Administered 2020-06-17: 3 [IU] via SUBCUTANEOUS
  Administered 2020-06-17: 5 [IU] via SUBCUTANEOUS
  Administered 2020-06-18: 2 [IU] via SUBCUTANEOUS
  Administered 2020-06-19: 3 [IU] via SUBCUTANEOUS
  Administered 2020-06-19 – 2020-06-21 (×2): 2 [IU] via SUBCUTANEOUS
  Administered 2020-06-22: 3 [IU] via SUBCUTANEOUS
  Administered 2020-06-22 – 2020-06-23 (×2): 2 [IU] via SUBCUTANEOUS

## 2020-06-12 MED ORDER — FAMOTIDINE 20 MG PO TABS: 20.0000 mg | ORAL_TABLET | Freq: Every day | ORAL | Status: DC

## 2020-06-12 MED ORDER — ACETAMINOPHEN 650 MG RE SUPP: 650.0000 mg | RECTAL | Status: DC | PRN

## 2020-06-12 MED ORDER — LIVING WELL WITH DIABETES BOOK
Freq: Once | Status: AC
  Filled 2020-06-12: qty 1

## 2020-06-12 MED ORDER — INSULIN GLARGINE 100 UNIT/ML ~~LOC~~ SOLN
35.0000 [IU] | Freq: Every day | SUBCUTANEOUS | Status: DC
  Administered 2020-06-12: 35 [IU] via SUBCUTANEOUS
  Filled 2020-06-12 (×2): qty 0.35

## 2020-06-12 MED ORDER — ZOLPIDEM TARTRATE 5 MG PO TABS: 5.0000 mg | ORAL_TABLET | Freq: Every evening | ORAL | 0 refills | Status: DC | PRN

## 2020-06-12 MED ORDER — HYDRALAZINE HCL 25 MG PO TABS: 25.0000 mg | ORAL_TABLET | Freq: Three times a day (TID) | ORAL | Status: DC

## 2020-06-12 MED ORDER — ACETAMINOPHEN 325 MG PO TABS
650.0000 mg | ORAL_TABLET | ORAL | Status: DC | PRN
Start: 2020-06-12 — End: 2020-06-26
  Administered 2020-06-14 – 2020-06-22 (×6): 650 mg via ORAL
  Filled 2020-06-12 (×7): qty 2

## 2020-06-12 MED ORDER — AMLODIPINE BESYLATE 10 MG PO TABS
10.0000 mg | ORAL_TABLET | Freq: Every day | ORAL | Status: DC
  Administered 2020-06-13 – 2020-06-26 (×14): 10 mg via ORAL
  Filled 2020-06-12 (×14): qty 1

## 2020-06-12 MED ORDER — FAMOTIDINE 20 MG PO TABS
20.0000 mg | ORAL_TABLET | Freq: Every day | ORAL | Status: DC
  Administered 2020-06-13 – 2020-06-26 (×14): 20 mg via ORAL
  Filled 2020-06-12 (×14): qty 1

## 2020-06-12 MED ORDER — BLOOD PRESSURE CONTROL BOOK
Freq: Once | Status: AC
  Filled 2020-06-12: qty 1

## 2020-06-12 MED ORDER — ASPIRIN 81 MG PO TBEC: 81.0000 mg | DELAYED_RELEASE_TABLET | Freq: Every day | ORAL | 11 refills | Status: DC

## 2020-06-12 MED ORDER — CLOPIDOGREL BISULFATE 75 MG PO TABS
75.0000 mg | ORAL_TABLET | Freq: Every day | ORAL | Status: DC
  Administered 2020-06-13 – 2020-06-26 (×14): 75 mg via ORAL
  Filled 2020-06-12 (×14): qty 1

## 2020-06-12 MED ORDER — HYDRALAZINE HCL 25 MG PO TABS
25.0000 mg | ORAL_TABLET | Freq: Three times a day (TID) | ORAL | Status: DC
  Administered 2020-06-12 – 2020-06-26 (×41): 25 mg via ORAL
  Filled 2020-06-12 (×41): qty 1

## 2020-06-12 MED ORDER — METOPROLOL TARTRATE 50 MG PO TABS
100.0000 mg | ORAL_TABLET | Freq: Two times a day (BID) | ORAL | Status: DC
  Administered 2020-06-12 – 2020-06-26 (×28): 100 mg via ORAL
  Filled 2020-06-12 (×28): qty 2

## 2020-06-12 MED ORDER — NITROGLYCERIN 0.4 MG SL SUBL: 0.4000 mg | SUBLINGUAL_TABLET | SUBLINGUAL | Status: DC | PRN

## 2020-06-12 MED ORDER — ENSURE ENLIVE PO LIQD: 237.0000 mL | Freq: Two times a day (BID) | ORAL | 12 refills | Status: DC

## 2020-06-12 MED ORDER — SENNOSIDES-DOCUSATE SODIUM 8.6-50 MG PO TABS
1.0000 | ORAL_TABLET | Freq: Every evening | ORAL | Status: DC | PRN
  Administered 2020-06-12 – 2020-06-19 (×3): 1 via ORAL
  Filled 2020-06-12 (×3): qty 1

## 2020-06-12 MED ORDER — FLUOXETINE HCL 20 MG PO CAPS
20.0000 mg | ORAL_CAPSULE | Freq: Every day | ORAL | Status: DC
  Administered 2020-06-13 – 2020-06-26 (×14): 20 mg via ORAL
  Filled 2020-06-12 (×14): qty 1

## 2020-06-12 MED ORDER — TRAZODONE HCL 50 MG PO TABS
50.0000 mg | ORAL_TABLET | Freq: Once | ORAL | Status: AC
  Administered 2020-06-12: 50 mg via ORAL
  Filled 2020-06-12: qty 1

## 2020-06-12 MED ORDER — ACETAMINOPHEN 160 MG/5ML PO SOLN: 650.0000 mg | ORAL | Status: DC | PRN

## 2020-06-12 MED ORDER — HYDRALAZINE HCL 25 MG PO TABS
25.0000 mg | ORAL_TABLET | Freq: Three times a day (TID) | ORAL | Status: DC
  Administered 2020-06-12 (×2): 25 mg via ORAL
  Filled 2020-06-12 (×2): qty 1

## 2020-06-12 MED ORDER — ENSURE ENLIVE PO LIQD
237.0000 mL | Freq: Two times a day (BID) | ORAL | Status: DC
  Administered 2020-06-13 – 2020-06-18 (×8): 237 mL via ORAL

## 2020-06-12 NOTE — Progress Notes (Signed)
Patient arrived on unit, oriented to unit. Reviewed medications, therapy schedule, rehab routine and plan of care. States an understanding of information reviewed. No complications noted at this time. Patient reports no pain and is AX4 Kathyjo Briere L Emmani Lesueur  

## 2020-06-12 NOTE — Progress Notes (Signed)
Report called to RN on 4W. SWOT will come assist with transferring patient.

## 2020-06-12 NOTE — Progress Notes (Signed)
Inpatient Rehabilitation Medication Review by a Pharmacist  A complete drug regimen review was completed for this patient to identify any potential clinically significant medication issues.  Clinically significant medication issues were identified:  yes   Type of Medication Issue Identified Description of Issue Urgent (address now) Non-Urgent (address on AM team rounds) Plan   Drug Interaction(s) (clinically significant)       Duplicate Therapy       Allergy       No Medication Administration End Date  Aspirin end date 2/13 (3 weeks per neurology)  Non-urgent Consider adding end date of 2/13 to aspirin order    Incorrect Dose       Additional Drug Therapy Needed       Other  Home Losartan and HCTZ switched to hydralazine due to AKI  Glipizide '10mg'$  BID held  Non-urgent F/u renal function, switch hydralazine back to losartan and/or HCTZ as able   BG well controlled on insulin glargine, continue to hold glipizide        Time spent performing this drug regimen review (minutes):  [place], PharmD, [place], [facility] Clinical Pharmacist  Please check AMION for all [place] phone numbers After 10:00 PM, call [place]

## 2020-06-12 NOTE — Progress Notes (Signed)
Hannah Staggers, MD  Physician  Physical Medicine and Rehabilitation  Consult Note      Signed  Date of Service:  06/10/2020  9:20 AM      Related encounter: ED to Hosp-Admission (Current) from 06/09/2020 in [place] MED/SURG UNIT       Signed      Expand All Collapse All     Show:Clear all '[x]'$ Manual'[x]'$ Template'[]'$ Copied  Added by: '[x]'$ Angiulli, Lavon Paganini, PA-C'[x]'$ Hannah Staggers, MD   '[]'$ Hover for details           Physical Medicine and Rehabilitation Consult Reason for Consult: Left side weakness Referring Physician: [facility]     HPI: Hannah [name] is a 55 y.o. right-handed female with history of hyperlipidemia, CKD stage IV, multiple sclerosis, diabetes mellitus, hypertension, , multiple CVA with right-sided residual weakness and , slurred speech maintained on aspirin and Plavix, patient has refused and declined multiple medical follow-up appointments..  Per chart review patient lives with her family.  She has assistance as needed.  Used a Rollator prior to admission.  Family assist with self-care.  Presented 06/09/2020 with dizziness and vision changes as well as left side weakness.  Blood pressure 208/102.  Cranial CT scan negative for acute changes.  MRI showed punctate focus of restricted diffusion within the dorsal pons on the right side.  Scattered and confluent foci of T2 hyperintensity also seen within the white matter of the cerebral hemispheres and pons, nonspecific consistent with clinical diagnosis of multiple sclerosis with multiple new lesions.  MRA unremarkable.  Admission chemistries glucose 161, BUN 39, creatinine 4.11 creatinine from February 2021 was 2.81, alcohol negative.  Patient did not receive TPA.  Currently remains on aspirin and Plavix for CVA prophylaxis.  Subcutaneous Lovenox for DVT prophylaxis.  Therapy evaluation completed with recommendations of physical medicine rehab consult due to left-sided weakness and facial droop with slurred  speech.     Review of Systems  Constitutional: Negative for chills and fever.  HENT: Negative for hearing loss.   Eyes: Positive for blurred vision. Negative for double vision.  Respiratory: Negative for cough and shortness of breath.   Cardiovascular: Negative for chest pain, palpitations and leg swelling.  Gastrointestinal: Positive for constipation. Negative for heartburn, nausea and vomiting.  Genitourinary: Negative for dysuria, flank pain and hematuria.  Musculoskeletal: Positive for joint pain and myalgias.  Skin: Negative for rash.  Neurological: Positive for dizziness, speech change and weakness.  All other systems reviewed and are negative.       Past Medical History:  Diagnosis Date  . Cerebral infarction ([place]) 07/12/2017    2009 first one, has had a total of 4 stokes  . Diabetes type 2, controlled ([place])    . Hemiparesis affecting right side as late effect of cerebrovascular accident (CVA) ([place])    . Hyperlipidemia    . Hypertension    . Multiple sclerosis ([place]) 2009  . Stroke [facility])           Past Surgical History:  Procedure Laterality Date  . APPENDECTOMY      . CYSTOSTOMY W/ BLADDER BIOPSY      . OVARIAN CYST SURGERY      . stent placed in heart        History reviewed. No pertinent family history. Social History:  reports that she has never smoked. She has never used smokeless tobacco. She reports previous alcohol use. She reports that she does not use drugs. Allergies:  Allergies  Allergen Reactions  . Ibuprofen Other (See Comments) and Swelling      Edema Swelling to hands, feet and face    . Influenza Vaccines Anaphylaxis      Swelling, fever  . Pneumococcal Vaccine Anaphylaxis      Swelling, fever Swelling, fever    . Promethazine Other (See Comments)      hallucinations hallucinations    . Cyclobenzaprine Other (See Comments)      hallucinations    . Oxycodone Nausea Only and Nausea And Vomiting          Medications Prior to  Admission  Medication Sig Dispense Refill  . amLODipine (NORVASC) 10 MG tablet Take 10 mg by mouth daily.      . Blood Pressure Monitor DEVI 1 Units by Does not apply route 2 (two) times daily. 1 Device 0  . clopidogrel (PLAVIX) 75 MG tablet Take 75 mg by mouth daily.      . famotidine (PEPCID) 40 MG tablet Take 40 mg by mouth daily.      Marland Kitchen FLUoxetine (PROZAC) 20 MG capsule        . glipiZIDE (GLUCOTROL) 10 MG tablet Take 10 mg by mouth daily.      . hydrochlorothiazide (HYDRODIURIL) 25 MG tablet Take 25 mg by mouth daily.      . Insulin Degludec (TRESIBA [place]) Inject 35-40 Units into the skin at bedtime. Per sliding scale      . Insulin Lispro (HUMALOG KWIKPEN [place]) Inject 6 Units into the skin in the morning, at noon, and at bedtime.      Marland Kitchen losartan (COZAAR) 50 MG tablet Take 50 mg by mouth daily.      . metoprolol tartrate (LOPRESSOR) 100 MG tablet Take 100 mg by mouth 2 (two) times daily.      . nitroGLYCERIN (NITROSTAT) 0.4 MG SL tablet Place 0.4 mg under the tongue every 5 (five) minutes as needed for chest pain.      . rosuvastatin (CRESTOR) 20 MG tablet Take 20 mg by mouth daily.      . insulin lispro (HUMALOG KWIKPEN) 100 UNIT/ML KwikPen Inject into the skin.          Home: Home Living Family/patient expects to be discharged to:: Private residence Living Arrangements: Children Available Help at Discharge: Family,Friend(s),Available 24 hours/day Type of Home: House Home Access: Stairs to enter Home Equipment: Walker - 4 wheels,Cane - single point  Functional History: Prior Function Level of Independence: Needs assistance Gait / Transfers Assistance Needed: rollstor with assist ADL's / Homemaking Assistance Needed: famly assist with self care Functional Status:  Mobility: Bed Mobility Overal bed mobility: Needs Assistance Bed Mobility: Supine to Sit,Sit to Supine Supine to sit: Mod assist Sit to supine: Max assist Transfers Overall transfer level: Needs assistance Equipment  used: 4-wheeled walker Transfers: Sit to/from Stand Sit to Stand: Min assist Ambulation/Gait General Gait Details: unable to walk Gait velocity: reduced   ADL:   Cognition: Cognition Overall Cognitive Status: Within Functional Limits for tasks assessed Orientation Level: Oriented X4 Cognition Arousal/Alertness: Lethargic Behavior During Therapy: Flat affect Overall Cognitive Status: Within Functional Limits for tasks assessed   Blood pressure (!) 178/85, pulse 87, temperature 98.8 F (37.1 C), temperature source Oral, resp. rate 16, SpO2 99 %. Physical Exam Constitutional:      Appearance: She is obese.  HENT:     Head: Normocephalic.     Right Ear: External ear normal.     Left Ear: External ear  normal.     Nose: Nose normal.     Mouth/Throat:     Pharynx: No oropharyngeal exudate or posterior oropharyngeal erythema.  Eyes:     Pupils: Pupils are equal, round, and reactive to light.  Cardiovascular:     Rate and Rhythm: Normal rate and regular rhythm.     Heart sounds: No murmur heard. No gallop.   Pulmonary:     Effort: Pulmonary effort is normal. No respiratory distress.     Breath sounds: No wheezing or rales.  Abdominal:     General: Bowel sounds are normal. There is no distension.     Palpations: Abdomen is soft.  Musculoskeletal:        General: No tenderness or deformity.     Cervical back: Normal range of motion.  Skin:    General: Skin is warm and dry.  Neurological:     Comments: Patient is alert.  Speech is a bit dysarthric but intelligible.  Provides her name and age with some delay in processing. Provides basic biographical information but limited insight and awareness.  Follows simple commands. Lacks adduction of the right eye. Has right facial droop. RUE 3 to 3+/5. RLE 2 to 2+/5 with apparent motor planning issues. Right leg almost rigid without any obvious clonus or significant increase in DTR's. LUE and LLE grossly 4+ to 5/5. Marland Kitchen   Psychiatric:         Mood and Affect: Mood normal.        Behavior: Behavior normal.        Lab Results Last 24 Hours       Results for orders placed or performed during the hospital encounter of 06/09/20 (from the past 24 hour(s))  CBG monitoring, ED     Status: Abnormal    Collection Time: 06/09/20 12:58 PM  Result Value Ref Range    Glucose-Capillary 203 (H) 70 - 99 mg/dL    Comment 1 Notify RN      Comment 2 Document in Chart    Glucose, capillary     Status: Abnormal    Collection Time: 06/09/20  4:32 PM  Result Value Ref Range    Glucose-Capillary 196 (H) 70 - 99 mg/dL  TSH     Status: None    Collection Time: 06/09/20  4:35 PM  Result Value Ref Range    TSH 0.623 0.350 - 4.500 uIU/mL  Glucose, capillary     Status: Abnormal    Collection Time: 06/09/20  9:01 PM  Result Value Ref Range    Glucose-Capillary 102 (H) 70 - 99 mg/dL  Hemoglobin A1c     Status: Abnormal    Collection Time: 06/10/20  5:43 AM  Result Value Ref Range    Hgb A1c MFr Bld 8.9 (H) 4.8 - 5.6 %    Mean Plasma Glucose 208.73 mg/dL  Lipid panel     Status: None    Collection Time: 06/10/20  5:43 AM  Result Value Ref Range    Cholesterol 133 0 - 200 mg/dL    Triglycerides 113 <150 mg/dL    HDL 54 >40 mg/dL    Total CHOL/HDL Ratio 2.5 RATIO    VLDL 23 0 - 40 mg/dL    LDL Cholesterol 56 0 - 99 mg/dL  Glucose, capillary     Status: None    Collection Time: 06/10/20  6:08 AM  Result Value Ref Range    Glucose-Capillary 82 70 - 99 mg/dL       Imaging  Results (Last 48 hours)  MR ANGIO HEAD WO CONTRAST   Result Date: 06/09/2020 CLINICAL DATA:  Neuro deficit, acute, stroke suspected. EXAM: MRI HEAD WITHOUT CONTRAST MRA HEAD WITHOUT CONTRAST TECHNIQUE: Multiplanar, multiecho pulse sequences of the brain and surrounding structures were obtained without intravenous contrast. Angiographic images of the head were obtained using MRA technique without contrast. COMPARISON:  Head CT June 09, 2020. MRI of the brain June 17, 2018. FINDINGS: MRI HEAD FINDINGS Brain: Punctate focus of restricted diffusion within the dorsal pons on the right side. This may represent an acute/subacute infarct versus active demyelination. Scattered and confluent foci of T2 hyperintensity are seen within the white matter of the cerebral hemispheres and within the pons, nonspecific, consistent with the clinical diagnosis of multiple sclerosis with multiple new lesions seen when compared to prior MRI. Foci of tissue loss within the bilateral lenticulocapsular region and within the pons with associated susceptibility artifact most likely represent remote lacunar infarcts. Vascular: Normal flow voids. Skull and upper cervical spine: Normal marrow signal. Sinuses/Orbits: Bilateral lens surgery. Mucous retention cyst in the left sphenoid sinus. MRA HEAD FINDINGS The visualized portions of the distal cervical and intracranial internal carotid arteries are widely patent with normal flow related enhancement. The bilateral anterior cerebral arteries and middle cerebral arteries are widely patent with antegrade flow without high-grade flow-limiting stenosis or proximal branch occlusion. No intracranial aneurysm within the anterior circulation. The vertebral arteries are widely patent with antegrade flow. The posterior inferior cerebral arteries are normal. Vertebrobasilar junction and basilar artery are widely patent with antegrade flow without evidence of basilar stenosis or aneurysm. Posterior cerebral arteries are normal bilaterally. No intracranial aneurysm within the posterior circulation. IMPRESSION: 1. Punctate focus of restricted diffusion within the dorsal pons on the right side. This may represent an acute/subacute infarct versus active demyelination. 2. Scattered and confluent foci of T2 hyperintensity are seen within the white matter of the cerebral hemispheres and pons, nonspecific, consistent with the clinical diagnosis of multiple sclerosis with multiple  new lesions. 3. Unremarkable MRA of the head without evidence of high-grade flow-limiting stenosis or proximal branch occlusion. These results were discussed in person at the time of interpretation on 06/09/2020 at 9:35 am with Dr. Lesleigh Noe . Electronically Signed   By: Pedro Earls M.D.   On: 06/09/2020 09:36    MR BRAIN WO CONTRAST   Result Date: 06/09/2020 CLINICAL DATA:  Neuro deficit, acute, stroke suspected. EXAM: MRI HEAD WITHOUT CONTRAST MRA HEAD WITHOUT CONTRAST TECHNIQUE: Multiplanar, multiecho pulse sequences of the brain and surrounding structures were obtained without intravenous contrast. Angiographic images of the head were obtained using MRA technique without contrast. COMPARISON:  Head CT June 09, 2020. MRI of the brain June 17, 2018. FINDINGS: MRI HEAD FINDINGS Brain: Punctate focus of restricted diffusion within the dorsal pons on the right side. This may represent an acute/subacute infarct versus active demyelination. Scattered and confluent foci of T2 hyperintensity are seen within the white matter of the cerebral hemispheres and within the pons, nonspecific, consistent with the clinical diagnosis of multiple sclerosis with multiple new lesions seen when compared to prior MRI. Foci of tissue loss within the bilateral lenticulocapsular region and within the pons with associated susceptibility artifact most likely represent remote lacunar infarcts. Vascular: Normal flow voids. Skull and upper cervical spine: Normal marrow signal. Sinuses/Orbits: Bilateral lens surgery. Mucous retention cyst in the left sphenoid sinus. MRA HEAD FINDINGS The visualized portions of the distal cervical and intracranial internal carotid arteries  are widely patent with normal flow related enhancement. The bilateral anterior cerebral arteries and middle cerebral arteries are widely patent with antegrade flow without high-grade flow-limiting stenosis or proximal branch occlusion. No  intracranial aneurysm within the anterior circulation. The vertebral arteries are widely patent with antegrade flow. The posterior inferior cerebral arteries are normal. Vertebrobasilar junction and basilar artery are widely patent with antegrade flow without evidence of basilar stenosis or aneurysm. Posterior cerebral arteries are normal bilaterally. No intracranial aneurysm within the posterior circulation. IMPRESSION: 1. Punctate focus of restricted diffusion within the dorsal pons on the right side. This may represent an acute/subacute infarct versus active demyelination. 2. Scattered and confluent foci of T2 hyperintensity are seen within the white matter of the cerebral hemispheres and pons, nonspecific, consistent with the clinical diagnosis of multiple sclerosis with multiple new lesions. 3. Unremarkable MRA of the head without evidence of high-grade flow-limiting stenosis or proximal branch occlusion. These results were discussed in person at the time of interpretation on 06/09/2020 at 9:35 am with Dr. Lesleigh Noe . Electronically Signed   By: Pedro Earls M.D.   On: 06/09/2020 09:36    MR BRAIN W CONTRAST   Result Date: 06/09/2020 CLINICAL DATA:  Brain mass or lesion. MRI of the brain earlier today showing focus of restricted diffusion within the pons concerning for infarct versus active demyelination. EXAM: MRI HEAD WITH CONTRAST TECHNIQUE: Multiplanar, multiecho pulse sequences of the brain and surrounding structures were obtained with intravenous contrast. CONTRAST:  43m GADAVIST GADOBUTROL 1 MMOL/ML IV SOLN COMPARISON:  MRI of the brain without contrast June 09, 2020. FINDINGS: Limited study with only T1 postcontrast images and T2 coronal. No focus of abnormal contrast enhancement associated with the T2 hyperintense white matter lesions or correlating with focus of restricted diffusion within the pons described on prior MRI to suggest active demyelination. Therefore, focus of  restricted diffusion seen on prior MRI likely represents a small acute ischemic insult. IMPRESSION: No focus of abnormal contrast enhancement. Focus of restricted diffusion within the pons seen on prior MRI likely represents a small acute ischemic insult. Electronically Signed   By: KPedro EarlsM.D.   On: 06/09/2020 14:19    CT HEAD CODE STROKE WO CONTRAST   Result Date: 06/09/2020 CLINICAL DATA:  Code stroke.  Neuro deficit, acute, stroke suspected EXAM: CT HEAD WITHOUT CONTRAST TECHNIQUE: Contiguous axial images were obtained from the base of the skull through the vertex without intravenous contrast. COMPARISON:  06/17/2018 FINDINGS: Brain: No acute infarct or intracranial hemorrhage. No mass lesion. No midline shift, ventriculomegaly or extra-axial fluid collection. Scattered and confluent supratentorial white matter hypodensities likely reflect sequela of multiple sclerosis. Chronic lacunar insult involving the left internal capsule posterior limb. Vascular: No hyperdense vessel or unexpected calcification. Carotid siphon atherosclerotic calcifications. Skull: Negative for fracture or focal lesion. Sinuses/Orbits: No acute orbital finding. Small sphenoid sinus mucous retention cyst. Other: None. ASPECTS ([facility] Score) - Ganglionic level infarction (caudate, lentiform nuclei, internal capsule, insula, M1-M3 cortex): 7 - Supraganglionic infarction (M4-M6 cortex): 3 Total score (0-10 with 10 being normal): 10 IMPRESSION: 1. No acute intracranial process. Supratentorial demyelinating lesions. 2. Chronic left internal capsule lacunar insult. 3. ASPECTS is 10 Code stroke imaging results were communicated on 06/09/2020 at 8:09 am to provider Dr BCurly Shoresvia secure text paging. Electronically Signed   By: CPrimitivo GauzeM.D.   On: 06/09/2020 08:11         Assessment/Plan: Diagnosis: MS excacerbation  1. Does the need for close, 24 hr/day medical supervision in  concert with the patient's rehab needs make it unreasonable for this patient to be served in a less intensive setting? Yes 2. Co-Morbidities requiring supervision/potential complications: CKD IV, DM, HTN, morbid obesity, previous CVA's 3. Due to bladder management, bowel management, safety, skin/wound care, disease management, medication administration, pain management and patient education, does the patient require 24 hr/day rehab nursing? Yes 4. Does the patient require coordinated care of a physician, rehab nurse, therapy disciplines of PT, OT, SLP to address physical and functional deficits in the context of the above medical diagnosis(es)? Yes Addressing deficits in the following areas: balance, endurance, locomotion, strength, transferring, bowel/bladder control, bathing, dressing, feeding, grooming, toileting, cognition, speech, swallowing and psychosocial support 5. Can the patient actively participate in an intensive therapy program of at least 3 hrs of therapy per day at least 5 days per week? Yes 6. The potential for patient to make measurable gains while on inpatient rehab is excellent 7. Anticipated functional outcomes upon discharge from inpatient rehab are supervision and min assist  with PT, supervision and min assist with OT, supervision with SLP. 8. Estimated rehab length of stay to reach the above functional goals is: 20-25 days 9. Anticipated discharge destination: Home 10. Overall Rehab/Functional Prognosis: excellent   RECOMMENDATIONS: This patient's condition is appropriate for continued rehabilitative care in the following setting: CIR Patient has agreed to participate in recommended program. Yes Note that insurance prior authorization may be required for reimbursement for recommended care.   Comment: Rehab Admissions Coordinator to follow up.   Thanks,   Hannah Staggers, MD, Mellody Drown   I have personally performed a face to face diagnostic evaluation of this patient.  Additionally, I have examined pertinent labs and radiographic images. I have reviewed and concur with the physician assistant's documentation above.      Cathlyn Parsons, PA-C 06/10/2020          Revision History                        Routing History                   Note Details  Author Hannah Staggers, MD File Time 06/10/2020  1:47 PM  Author Type Physician Status Signed  Last Editor Hannah Staggers, MD Service Physical Medicine and Rehabilitation

## 2020-06-12 NOTE — Progress Notes (Signed)
Stroke education was discussed signs and symptoms importance call 911 asap. The risk factors of delayed response, importance keeping folllow up appts and compliance of meds. Pt verbalizes understanding.

## 2020-06-12 NOTE — Progress Notes (Signed)
Hannah Gong, RN  Rehab Admission Coordinator  Physical Medicine and Rehabilitation  PMR Pre-admission      Signed  Date of Service:  06/11/2020  8:13 PM      Related encounter: ED to Hosp-Admission (Current) from 06/09/2020 in [facility]       Signed          Show:Clear all '[x]'$ Manual'[x]'$ Template'[x]'$ Copied  Added by: '[x]'$ Hannah Gong, RN   '[]'$ Hover for details  PMR Admission Coordinator Pre-Admission Assessment   Patient: Hannah [name] is an 55 y.o., female MRN: [id number] DOB: Dec 21, 1965 Height: '5\' 2"'$  (157.5 cm) Weight: 59.9 kg (stated)                                                                                                                                                  Insurance Information HMO: yes    PPO:      PCP:      IPA:      80/20:      OTHER:  PRIMARY: [employer] Medicare      Policy#: [id number]      Subscriber: pt CM Name: Hannah [name]      Phone#: [phone] option #8     Fax#: [phone] Pre-Cert#: [id number]  Approved for 7 days    Employer:  Benefits:  Phone #: [phone]     Name: 1/26 Eff. Date: 05/17/2020     Deduct: none      Out of Pocket Max: $4500      Life Max: none  CIR: $325 co pay per day days 1 until 5      SNF: no copay days 1 until 20; $188 co pay per day days 21 until 45; no copay days 46 until 100 Outpatient: $30 per visit     Co-Pay: visits per medical neccesity Home Health: 100%      Co-Pay: visits per medical neccesity DME: 53     Co-Pay: 20% Providers: in network  SECONDARY: none      Policy#:       Phone#:    Development worker, community:       Phone#:    [employer] Engineer, [employer]" for patients in Inpatient Rehabilitation Facilities with attached "Privacy Act [place] Records" was provided and verbally reviewed with: Patient and Family   Emergency Contact Information         Contact Information     Name Relation Home Work Hannah [name] Daughter     [phone]     Hannah [name] Daughter     [phone]       Current Medical History  Patient Admitting Diagnosis: CVA   History of Present Illness: 55 year old right-handed female with history of hyperlipidemia, CKD stage IV, multiple sclerosis, diabetes mellitus, hypertension, multiple CVA with right side residual weakness and slurred speech maintained on  aspirin and Plavix.  Patient reportedly has missed multiple medical follow-up appointments.  Presented 06/09/2020 with dizziness and vision change as well as left-sided weakness.  Blood pressure 208/102.  Cranial CT scan negative for acute changes.  MRI showed punctate focus of restricted diffusion within the dorsal pons on the right side.  Scattered and confluent foci of T2 hyperintensity also seen within the white matter of the cerebral hemispheres and pons, nonspecific consistent with clinical diagnosis of multiple sclerosis with multiple new lesions.  MRA unremarkable.  Admission chemistries glucose 161 BUN 39 creatinine 4.11 with noted creatinine from February 2021 2.81, alcohol negative.  Patient did not receive TPA.  Currently maintained on aspirin and Plavix for CVA prophylaxis.  Subcutaneous Lovenox for DVT prophylaxis.     Complete NIHSS TOTAL: 7   Past Medical History      Past Medical History:  Diagnosis Date  . Cerebral infarction ([place]) 07/12/2017    2009 first one, has had a total of 4 stokes  . Diabetes type 2, controlled ([place])    . Hemiparesis affecting right side as late effect of cerebrovascular accident (CVA) ([place])    . Hyperlipidemia    . Hypertension    . Multiple sclerosis ([place]) 2009  . Stroke [facility])        Family History  family history is not on file.   Prior Rehab/Hospitalizations:  Has the patient had prior rehab or hospitalizations prior to admission? Yes   Has the patient had major surgery during 100 days prior to admission? No   Current Medications    Current Facility-Administered Medications:  .  0.9 %  sodium  chloride infusion, , Intravenous, Continuous, Adhikari, Amrit, MD, Last Rate: 75 mL/hr at 06/11/20 1419, New Bag at 06/11/20 1419 .  acetaminophen (TYLENOL) tablet 650 mg, 650 mg, Oral, Q4H PRN, 650 mg at 06/09/20 2016 **OR** acetaminophen (TYLENOL) 160 MG/5ML solution 650 mg, 650 mg, Per Tube, Q4H PRN **OR** acetaminophen (TYLENOL) suppository 650 mg, 650 mg, Rectal, Q4H PRN, Karmen Bongo, MD .  amLODipine (NORVASC) tablet 10 mg, 10 mg, Oral, Daily, Ghimire, Kuber, MD, 10 mg at 06/11/20 1029 .  aspirin EC tablet 81 mg, 81 mg, Oral, Daily, Bhagat, Srishti L, MD, 81 mg at 06/11/20 1029 .  clopidogrel (PLAVIX) tablet 75 mg, 75 mg, Oral, Daily, Bhagat, Srishti L, MD, 75 mg at 06/11/20 1029 .  [START ON 06/12/2020] famotidine (PEPCID) tablet 20 mg, 20 mg, Oral, Daily, Adhikari, Amrit, MD .  feeding supplement (ENSURE ENLIVE / ENSURE PLUS) liquid 237 mL, 237 mL, Oral, BID BM, Barb Merino, MD, 237 mL at 06/10/20 1635 .  FLUoxetine (PROZAC) capsule 20 mg, 20 mg, Oral, Daily, Karmen Bongo, MD, 20 mg at 06/11/20 1029 .  heparin injection 5,000 Units, 5,000 Units, Subcutaneous, Q8H, Adhikari, Amrit, MD, 5,000 Units at 06/11/20 1215 .  hydrALAZINE (APRESOLINE) injection 10 mg, 10 mg, Intravenous, Q6H PRN, Adhikari, Amrit, MD .  insulin aspart (novoLOG) injection 0-15 Units, 0-15 Units, Subcutaneous, TID WC, Karmen Bongo, MD, 2 Units at 06/11/20 1639 .  insulin aspart (novoLOG) injection 0-5 Units, 0-5 Units, Subcutaneous, QHS, Karmen Bongo, MD .  insulin glargine (LANTUS) injection 35 Units, 35 Units, Subcutaneous, Ivery Quale, MD, 35 Units at 06/10/20 2225 .  metoprolol tartrate (LOPRESSOR) tablet 100 mg, 100 mg, Oral, BID, Barb Merino, MD, 100 mg at 06/11/20 1029 .  nitroGLYCERIN (NITROSTAT) SL tablet 0.4 mg, 0.4 mg, Sublingual, Q5 min PRN, Ghimire, Kuber, MD .  rosuvastatin (CRESTOR) tablet 20 mg,  20 mg, Oral, Daily, Karmen Bongo, MD, 20 mg at 06/11/20 1030 .  senna-docusate  (Senokot-S) tablet 1 tablet, 1 tablet, Oral, QHS PRN, Karmen Bongo, MD .  zolpidem (AMBIEN) tablet 5 mg, 5 mg, Oral, QHS PRN, Shelly Coss, MD   Patients Current Diet:     Diet Order                      Diet heart healthy/carb modified Room service appropriate? Yes; Fluid consistency: Thin  Diet effective ____                      Precautions / Restrictions Precautions Precautions: Fall Precaution Comments: R spastic hemiparesis in UE/LE Restrictions Weight Bearing Restrictions: No    Has the patient had 2 or more falls or a fall with injury in the past year?No   Prior Activity Level Limited Community (1-2x/wk): supervision with RW assisted with adls   Prior Functional Level Prior Function Level of Independence: Needs assistance Gait / Transfers Assistance Needed: rollstor with assist ADL's / Homemaking Assistance Needed: famly assist with self care   Self Care: Did the patient need help bathing, dressing, using the toilet or eating?  Needed some help   Indoor Mobility: Did the patient need assistance with walking from room to room (with or without device)? Independent   Stairs: Did the patient need assistance with internal or external stairs (with or without device)? Needed some help   Functional Cognition: Did the patient need help planning regular tasks such as shopping or remembering to take medications? Needed some help   Home Assistive Devices / [place] Devices/Equipment: Blood pressure cuff,CBG Meter,Eyeglasses Home Equipment: Walker - 4 wheels,Cane - single point   Prior Device Use: Indicate devices/aids used by the patient prior to current illness, exacerbation or injury? Walker   Current Functional Level Cognition   Overall Cognitive Status: Within Functional Limits for tasks assessed Orientation Level: Oriented X4 General Comments: able to follow all commands, A&Ox4, good insight to deficits and safety, difficulty sequencing due  to vision impairment and co-ordination deficits    Extremity Assessment (includes Sensation/Coordination)   Upper Extremity Assessment: Generalized weakness,RUE deficits/detail,LUE deficits/detail RUE Deficits / Details: 3+/5 MM grade shoulder through digits LUE Deficits / Details: 4-/5 MM grade shoulder through digits  Lower Extremity Assessment: Generalized weakness     ADLs   Overall ADL's : Needs assistance/impaired Eating/Feeding: Set up,Sitting Grooming: Set up,Sitting Upper Body Bathing: Minimal assistance,Sitting Lower Body Bathing: Moderate assistance,Sitting/lateral leans,Sit to/from stand Upper Body Dressing : Minimal assistance,Sitting Lower Body Dressing: Moderate assistance,Sitting/lateral leans,Sit to/from stand Toilet Transfer: Moderate assistance,RW,Stand-pivot Toileting- Clothing Manipulation and Hygiene: Moderate assistance,Sit to/from stand,Sitting/lateral lean Functional mobility during ADLs: Moderate assistance,Rolling walker,Cueing for safety General ADL Comments: Pt limited by decreased strength, decreased ability to care for self and decreased activity tolerance.     Mobility   Overal bed mobility: Needs Assistance Bed Mobility: Rolling,Sidelying to Sit Rolling: Mod assist Sidelying to sit: Mod assist Supine to sit: Mod assist Sit to supine: Max assist General bed mobility comments: pt with limited use of R UE functionally to assist with rolling, hand over hand assist to reach across with R UE for L bedrail, modA to complete roll and then for trunk elevation to EOB     Transfers   Overall transfer level: Needs assistance Equipment used: Rolling walker (2 wheeled) Transfers: Sit to/from Stand Sit to Stand: Mod assist Stand pivot transfers: Mod assist General transfer  comment: modA to power up and maintain balance during transition of hands, assist for R UE placement onto walker, completed 3 sit to stand transfers     Ambulation / Gait / Stairs /  Wheelchair Mobility   Ambulation/Gait Ambulation/Gait assistance: Mod assist,+2 physical assistance,+2 safety/equipment Gait Distance (Feet): 6 Feet (x1, 15x1) Assistive device: Rolling walker (2 wheeled) Gait Pattern/deviations: Step-through pattern,Decreased stride length,Decreased weight shift to right,Decreased weight shift to left,Decreased dorsiflexion - right,Ataxic,Narrow base of support General Gait Details: pt with with anterior pitch forward and very spastic, ataxic like gait pattern. PT provided posterior trunk support via hands under pt's arm pits to front of shoulders to assist with trunk extension and stability, tactile cues for weight shifting and modA to advance R LE during swing phase, occasionally able to complete without assist. PT tech assist with walker management and chair follow Gait velocity: dec     Posture / Balance Dynamic Sitting Balance Sitting balance - Comments: unable to complete dynamic task of donning socks without modA to maintain balance Balance Overall balance assessment: Needs assistance Sitting-balance support: Single extremity supported,Feet supported Sitting balance-Leahy Scale: Fair Sitting balance - Comments: unable to complete dynamic task of donning socks without modA to maintain balance Standing balance support: Bilateral upper extremity supported Standing balance-Leahy Scale: Zero Standing balance comment: dependent on RW/external support     Special needs/care consideration Daughter, Vella Redhead to be the designated visitor Hgb A1c 8.9        Previous Home Environment  Living Arrangements:  (2 adult daughters)  Lives With: Daughter (two adult daughters) Available Help at Discharge: Family,Friend(s),Available 24 hours/day Type of Home: House Home Layout: One level Home Access: Stairs to enter [employer] Shower/Tub: Chiropodist: Standard Bathroom Accessibility: Yes How Accessible: Accessible via walker [place]:  No   Discharge Living Setting Plans for Discharge Living Setting: Patient's home,Lives with (comment) (2 adult daughters) Type of Home at Discharge: House Discharge Home Layout: One level Discharge Home Access: Stairs to enter Discharge Bathroom Shower/Tub: Tub/shower unit Discharge Bathroom Toilet: Standard Discharge Bathroom Accessibility: Yes How Accessible: Accessible via walker Does the patient have any problems obtaining your medications?: No   Social/Family/Support Systems Contact Information: two daughters, mainly [place] Anticipated Caregiver: [name] and her sister Anticipated Ambulance person Information: see above Caregiver Availability: 24/7 Discharge Plan Discussed with Primary Caregiver: Yes Is Caregiver In Agreement with Plan?: Yes Does Caregiver/Family have Issues with Lodging/Transportation while Pt is in Rehab?: No   Goals Patient/Family Goal for Rehab: supervision to min asisst with PT, OT and SLP Expected length of stay: 2 to 3 weeks Pt/Family Agrees to Admission and willing to participate: Yes Program Orientation Provided & Reviewed with Pt/Caregiver Including Roles  & Responsibilities: Yes   Decrease burden of Care through IP rehab admission: n/a   Possible need for SNF placement upon discharge:not anticipated   Patient Condition: This patient's condition remains as documented in the consult dated 06/11/2020, in which the Rehabilitation Physician determined and documented that the patient's condition is appropriate for intensive rehabilitative care in an inpatient rehabilitation facility. Will admit to inpatient rehab today.   Preadmission Screen Completed By:  Cleatrice Burke, RN, 06/11/2020 8:13 PM ______________________________________________________________________   Discussed status with Dr. Naaman Plummer on 06/12/2020 at  1115 and received approval for admission today.   Admission Coordinator:  Cleatrice Burke, time [id number] Date 06/12/2020              Cosigned by: Meredith Staggers, MD at 06/12/2020  11:17 AM    Revision History                    Note Details  Author Hannah Gong, RN File Time 06/12/2020 11:16 AM  Author Type Rehab Admission Coordinator Status Signed  Last Editor Hannah Gong, RN Service Physical Medicine and Rehabilitation

## 2020-06-12 NOTE — Progress Notes (Signed)
Inpatient Diabetes Program Recommendations  AACE/ADA: New Consensus Statement on Inpatient Glycemic Control (2015)  Target Ranges:  Prepandial:   less than 140 mg/dL      Peak postprandial:   less than 180 mg/dL (1-2 hours)      Critically ill patients:  140 - 180 mg/dL   Lab Results  Component Value Date   GLUCAP 133 (H) 06/12/2020   HGBA1C 8.9 (H) 06/10/2020    Review of Glycemic Control  Diabetes history: DM 2 Outpatient Diabetes medications:  Glucotrol 10 mg daily, Tresiba 35 units q HS, Humalog 6 units tid with meals Current orders for Inpatient glycemic control:  Novolog 0-15 units tid + HS scale Lantus 35 units q HS  A1c 8.9% this admission  Spoke with pt and one of her daughters at bedside regarding glucose control at home. Pt reports not following a diabetic diet at home. Discussed diet and portion sizes to pt. Attached information for her reference at home. Pt has all needed supplies at home. Pt is motivated to focus on her diet with her family support system.  Thanks,  Tama Headings RN, MSN, BC-ADM Inpatient Diabetes Coordinator Team Pager [phone] (8a-5p)

## 2020-06-12 NOTE — Progress Notes (Signed)
Patient called nurse into room crying stating "the bugs are crawling all over the windows". Nurse showed patient how window knobs may resemble a bug shape, but they are not bugs. Attempted to show patient and after several attempts; patient realized it was the window knob. Patient crying saying "my mind is playing tricks on me, what is wrong with my brain?". Nurse provided emotional support for patient.

## 2020-06-12 NOTE — Discharge Summary (Addendum)
Physician Discharge Summary  Hannah [name] [id number] DOB: 1966-03-11 DOA: 06/09/2020  PCP: Vernie Shanks, MD  Admit date: 06/09/2020 Discharge date: 06/12/2020  Admitted From: Home Disposition:  Home  Discharge Condition:Stable CODE STATUS:FULL Diet recommendation: Carbohydrate consistent    Brief/Interim Summary: Patient is a 55 year old lady with history of CKD stage IV, hypertension, uncontrolled type 2 diabetes, multiple strokes with right-sided residual weakness, GERD who presented to the emergency room with complaints of new onset visual changes, worsening of right-sided weakness, slurred speech.  On presentation, she was hemodynamically stable.  MRI showed acute nonhemorrhagic stroke of right dorsal pons.  Stroke work-up initiated and completed.  PT/OT recommended CIR.  Patient is medically stable for discharge to CIR .  Following problems were addressed during hospitalization:  Acute nonhemorrhagic stroke/right pontine stroke: Presented with dysarthria, gaze problem, worsening of right-sided weakness.  MRI of the brain showed right dorsal pons acute stroke.  MRA of the head and neck did not show  LVO.  Echo showed ejection fraction of 123456, grade 2 diastolic dysfunction.  She has history of  strokes and is weak on the right side.  She will be continued on aspirin and plavix.  LDL of 56, hemoglobin A1c 8.9.  PT/OT recommended CIR on discharge. She will follow-up with neurology in 4 weeks.  AKI on CKD stage IV: .  She was following with outpatient nephrology but recently has not. Avoid nephrotoxins.  Her creatinine on 06/19/2019 was 2.8.  Creatinine of 4 on presentation.  Currently she appears euvolemic.  Creatinine in the range of 3 today. Check BMP in a week.  We recommend to follow-up with nephrology in 2 weeks as an outpatient.  Hypertension: On amlodipine, losartan, hydrochlorothiazide at home.  Losartan hydrochlorothiazide were stopped because of AKI on presentation. Added on  hydralazine on discharge.  History of multiple sclerosis: Self-reported.  No evidence of MS flare.  Hyperlipidemia: On Crestor  Uncontrolled diabetes type 2: Hemoglobin A1C of 8.9.  Takes insulin and glipizide at home.  Continue to monitor blood sugars.  She was taking [place] and NovoLog at home.  Hospital course remarkable for episodes of hypoglycemia.  I will discontinue short-acting insulin on discharge.  We need to monitor her sugars very closely.  Debility/deconditioning: PT/OT recommend CIR on discharge.    Discharge Diagnoses:  Principal Problem:   Stroke-like symptoms Active Problems:   Multiple sclerosis (HCC)   Hypertension   Hyperlipidemia   Diabetes type 2, controlled ([place])   Acute kidney injury superimposed on CKD [facility])   Cerebral thrombosis with cerebral infarction    Discharge Instructions  Discharge Instructions    Ambulatory referral to Nephrology   Complete by: As directed    Ambulatory referral to Neurology   Complete by: As directed    Follow up with Dr. Leonie Man at [facility] in 4-6 weeks. Too complicated for RN to follow. Thanks.   Diet Carb Modified   Complete by: As directed    Discharge instructions   Complete by: As directed    1) Please take prescribed medications as instructed 2)Follow up with neurology in 4 weeks.Name and number of the provider has been attached 3)Monitor your blood sugar and blood pressure 4)Follow up with nephrology as an outpatient   Increase activity slowly   Complete by: As directed      Allergies as of 06/12/2020      Reactions   Ibuprofen Other (See Comments), Swelling   Edema Swelling to hands, feet and face   Influenza Vaccines  Anaphylaxis   Swelling, fever   Pneumococcal Vaccine Anaphylaxis   Swelling, fever Swelling, fever   Promethazine Other (See Comments)   hallucinations hallucinations   Cyclobenzaprine Other (See Comments)   hallucinations   Oxycodone Nausea Only, Nausea And Vomiting       Medication List    STOP taking these medications   HumaLOG KwikPen 100 UNIT/ML KwikPen Generic drug: insulin lispro   HUMALOG KWIKPEN [place]   hydrochlorothiazide 25 MG tablet Commonly known as: HYDRODIURIL   losartan 50 MG tablet Commonly known as: COZAAR     TAKE these medications   amLODipine 10 MG tablet Commonly known as: NORVASC Take 10 mg by mouth daily.   aspirin 81 MG EC tablet Take 1 tablet (81 mg total) by mouth daily. Swallow whole. Start taking on: June 13, 2020   Blood Pressure Monitor Devi 1 Units by Does not apply route 2 (two) times daily.   clopidogrel 75 MG tablet Commonly known as: PLAVIX Take 75 mg by mouth daily.   famotidine 20 MG tablet Commonly known as: PEPCID Take 1 tablet (20 mg total) by mouth daily. Start taking on: June 13, 2020 What changed:   medication strength  how much to take   feeding supplement Liqd Take 237 mLs by mouth 2 (two) times daily between meals.   FLUoxetine 20 MG capsule Commonly known as: PROZAC   glipiZIDE 10 MG tablet Commonly known as: GLUCOTROL Take 10 mg by mouth daily.   hydrALAZINE 25 MG tablet Commonly known as: APRESOLINE Take 1 tablet (25 mg total) by mouth every 8 (eight) hours.   metoprolol tartrate 100 MG tablet Commonly known as: LOPRESSOR Take 100 mg by mouth 2 (two) times daily.   nitroGLYCERIN 0.4 MG SL tablet Commonly known as: NITROSTAT Place 0.4 mg under the tongue every 5 (five) minutes as needed for chest pain.   rosuvastatin 20 MG tablet Commonly known as: CRESTOR Take 20 mg by mouth daily.   TRESIBA [place] Inject 35-40 Units into the skin at bedtime. Per sliding scale   zolpidem 5 MG tablet Commonly known as: AMBIEN Take 1 tablet (5 mg total) by mouth at bedtime as needed for sleep.       Follow-up Information    Garvin Fila, MD. Schedule an appointment as soon as possible for a visit in 4 week(s).   Specialties: Neurology, Radiology Contact information: [address] [place] [address] [phone]        Rosita Fire, MD. Schedule an appointment as soon as possible for a visit in 2 week(s).   Specialties: Nephrology, Internal Medicine Contact information: [address] [place] [address] [phone]              Allergies  Allergen Reactions  . Ibuprofen Other (See Comments) and Swelling    Edema Swelling to hands, feet and face   . Influenza Vaccines Anaphylaxis    Swelling, fever  . Pneumococcal Vaccine Anaphylaxis    Swelling, fever Swelling, fever   . Promethazine Other (See Comments)    hallucinations hallucinations   . Cyclobenzaprine Other (See Comments)    hallucinations   . Oxycodone Nausea Only and Nausea And Vomiting    Consultations:  Neurology   Procedures/Studies: MR ANGIO HEAD WO CONTRAST  Result Date: 06/09/2020 CLINICAL DATA:  Neuro deficit, acute, stroke suspected. EXAM: MRI HEAD WITHOUT CONTRAST MRA HEAD WITHOUT CONTRAST TECHNIQUE: Multiplanar, multiecho pulse sequences of the brain and surrounding structures were obtained without intravenous  contrast. Angiographic images of the head were obtained using MRA technique without contrast. COMPARISON:  Head CT June 09, 2020. MRI of the brain June 17, 2018. FINDINGS: MRI HEAD FINDINGS Brain: Punctate focus of restricted diffusion within the dorsal pons on the right side. This may represent an acute/subacute infarct versus active demyelination. Scattered and confluent foci of T2 hyperintensity are seen within the white matter of the cerebral hemispheres and within the pons, nonspecific, consistent with the clinical diagnosis of multiple sclerosis with multiple new lesions seen when compared to prior MRI. Foci of tissue loss within the bilateral lenticulocapsular region and within the pons with associated susceptibility artifact most likely represent remote lacunar infarcts. Vascular: Normal flow voids. Skull and upper cervical  spine: Normal marrow signal. Sinuses/Orbits: Bilateral lens surgery. Mucous retention cyst in the left sphenoid sinus. MRA HEAD FINDINGS The visualized portions of the distal cervical and intracranial internal carotid arteries are widely patent with normal flow related enhancement. The bilateral anterior cerebral arteries and middle cerebral arteries are widely patent with antegrade flow without high-grade flow-limiting stenosis or proximal branch occlusion. No intracranial aneurysm within the anterior circulation. The vertebral arteries are widely patent with antegrade flow. The posterior inferior cerebral arteries are normal. Vertebrobasilar junction and basilar artery are widely patent with antegrade flow without evidence of basilar stenosis or aneurysm. Posterior cerebral arteries are normal bilaterally. No intracranial aneurysm within the posterior circulation. IMPRESSION: 1. Punctate focus of restricted diffusion within the dorsal pons on the right side. This may represent an acute/subacute infarct versus active demyelination. 2. Scattered and confluent foci of T2 hyperintensity are seen within the white matter of the cerebral hemispheres and pons, nonspecific, consistent with the clinical diagnosis of multiple sclerosis with multiple new lesions. 3. Unremarkable MRA of the head without evidence of high-grade flow-limiting stenosis or proximal branch occlusion. These results were discussed in person at the time of interpretation on 06/09/2020 at 9:35 am with Dr. Lesleigh Noe . Electronically Signed   By: Pedro Earls M.D.   On: 06/09/2020 09:36   MR BRAIN WO CONTRAST  Result Date: 06/09/2020 CLINICAL DATA:  Neuro deficit, acute, stroke suspected. EXAM: MRI HEAD WITHOUT CONTRAST MRA HEAD WITHOUT CONTRAST TECHNIQUE: Multiplanar, multiecho pulse sequences of the brain and surrounding structures were obtained without intravenous contrast. Angiographic images of the head were obtained using  MRA technique without contrast. COMPARISON:  Head CT June 09, 2020. MRI of the brain June 17, 2018. FINDINGS: MRI HEAD FINDINGS Brain: Punctate focus of restricted diffusion within the dorsal pons on the right side. This may represent an acute/subacute infarct versus active demyelination. Scattered and confluent foci of T2 hyperintensity are seen within the white matter of the cerebral hemispheres and within the pons, nonspecific, consistent with the clinical diagnosis of multiple sclerosis with multiple new lesions seen when compared to prior MRI. Foci of tissue loss within the bilateral lenticulocapsular region and within the pons with associated susceptibility artifact most likely represent remote lacunar infarcts. Vascular: Normal flow voids. Skull and upper cervical spine: Normal marrow signal. Sinuses/Orbits: Bilateral lens surgery. Mucous retention cyst in the left sphenoid sinus. MRA HEAD FINDINGS The visualized portions of the distal cervical and intracranial internal carotid arteries are widely patent with normal flow related enhancement. The bilateral anterior cerebral arteries and middle cerebral arteries are widely patent with antegrade flow without high-grade flow-limiting stenosis or proximal branch occlusion. No intracranial aneurysm within the anterior circulation. The vertebral arteries are widely patent with antegrade flow. The posterior  inferior cerebral arteries are normal. Vertebrobasilar junction and basilar artery are widely patent with antegrade flow without evidence of basilar stenosis or aneurysm. Posterior cerebral arteries are normal bilaterally. No intracranial aneurysm within the posterior circulation. IMPRESSION: 1. Punctate focus of restricted diffusion within the dorsal pons on the right side. This may represent an acute/subacute infarct versus active demyelination. 2. Scattered and confluent foci of T2 hyperintensity are seen within the white matter of the cerebral  hemispheres and pons, nonspecific, consistent with the clinical diagnosis of multiple sclerosis with multiple new lesions. 3. Unremarkable MRA of the head without evidence of high-grade flow-limiting stenosis or proximal branch occlusion. These results were discussed in person at the time of interpretation on 06/09/2020 at 9:35 am with Dr. Lesleigh Noe . Electronically Signed   By: Pedro Earls M.D.   On: 06/09/2020 09:36   MR BRAIN W CONTRAST  Result Date: 06/09/2020 CLINICAL DATA:  Brain mass or lesion. MRI of the brain earlier today showing focus of restricted diffusion within the pons concerning for infarct versus active demyelination. EXAM: MRI HEAD WITH CONTRAST TECHNIQUE: Multiplanar, multiecho pulse sequences of the brain and surrounding structures were obtained with intravenous contrast. CONTRAST:  27m GADAVIST GADOBUTROL 1 MMOL/ML IV SOLN COMPARISON:  MRI of the brain without contrast June 09, 2020. FINDINGS: Limited study with only T1 postcontrast images and T2 coronal. No focus of abnormal contrast enhancement associated with the T2 hyperintense white matter lesions or correlating with focus of restricted diffusion within the pons described on prior MRI to suggest active demyelination. Therefore, focus of restricted diffusion seen on prior MRI likely represents a small acute ischemic insult. IMPRESSION: No focus of abnormal contrast enhancement. Focus of restricted diffusion within the pons seen on prior MRI likely represents a small acute ischemic insult. Electronically Signed   By: KPedro EarlsM.D.   On: 06/09/2020 14:19   ECHOCARDIOGRAM COMPLETE  Result Date: 06/11/2020    ECHOCARDIOGRAM REPORT   Patient Name:   SMYRANDA OSIASDate of Exam: 06/11/2020 Medical Rec #:  [id number]   Height:       62.0 in Accession #:    [id number]  Weight:       147.0 lb Date of Birth:  [birth year]  BSA:          1.677 m Patient Age:    89years     BP:           154/81 mmHg  Patient Gender: F            HR:           68 bpm. Exam Location:  Inpatient Procedure: 2D Echo, Cardiac Doppler and Color Doppler Indications:    Stroke 434.91 / I163.9  History:        Patient has no prior history of Echocardiogram examinations.                 Stroke; Risk Factors:Hypertension, Diabetes, Dyslipidemia and                 Non-Smoker.  Sonographer:    JVickie EpleyRDCS Referring Phys: 2[place] 1. Left ventricular ejection fraction, by estimation, is 60 to 65%. The left ventricle has normal function. The left ventricle has no regional wall motion abnormalities. Left ventricular diastolic parameters are consistent with Grade II diastolic dysfunction (pseudonormalization). Elevated left ventricular end-diastolic pressure.  2. Right ventricular systolic function is normal. The right ventricular  size is normal.  3. The pericardial effusion is posterior to the left ventricle.  4. The mitral valve is normal in structure. Trivial mitral valve regurgitation. No evidence of mitral stenosis.  5. The aortic valve is normal in structure. Aortic valve regurgitation is trivial. No aortic stenosis is present.  6. The inferior vena cava is normal in size with greater than 50% respiratory variability, suggesting right atrial pressure of 3 mmHg. FINDINGS  Left Ventricle: Left ventricular ejection fraction, by estimation, is 60 to 65%. The left ventricle has normal function. The left ventricle has no regional wall motion abnormalities. The left ventricular internal cavity size was normal in size. There is  no left ventricular hypertrophy. Left ventricular diastolic parameters are consistent with Grade II diastolic dysfunction (pseudonormalization). Elevated left ventricular end-diastolic pressure. Right Ventricle: The right ventricular size is normal. No increase in right ventricular wall thickness. Right ventricular systolic function is normal. Left Atrium: Left atrial size was normal in size.  Right Atrium: Right atrial size was normal in size. Pericardium: Trivial pericardial effusion is present. The pericardial effusion is posterior to the left ventricle. Mitral Valve: The mitral valve is normal in structure. Trivial mitral valve regurgitation. No evidence of mitral valve stenosis. Tricuspid Valve: The tricuspid valve is normal in structure. Tricuspid valve regurgitation is trivial. No evidence of tricuspid stenosis. Aortic Valve: The aortic valve is normal in structure. Aortic valve regurgitation is trivial. No aortic stenosis is present. Pulmonic Valve: The pulmonic valve was normal in structure. Pulmonic valve regurgitation is not visualized. No evidence of pulmonic stenosis. Aorta: The aortic root is normal in size and structure. Venous: The inferior vena cava is normal in size with greater than 50% respiratory variability, suggesting right atrial pressure of 3 mmHg. IAS/Shunts: No atrial level shunt detected by color flow Doppler.  LEFT VENTRICLE PLAX 2D LVIDd:         3.50 cm     Diastology LVIDs:         2.70 cm     LV e' medial:    3.75 cm/s LV PW:         0.80 cm     LV E/e' medial:  19.1 LV IVS:        0.80 cm     LV e' lateral:   4.13 cm/s LVOT diam:     1.90 cm     LV E/e' lateral: 17.3 LV SV:         51 LV SV Index:   31 LVOT Area:     2.84 cm  LV Volumes (MOD) LV vol d, MOD A2C: 77.4 ml LV vol d, MOD A4C: 86.3 ml LV vol s, MOD A2C: 31.0 ml LV vol s, MOD A4C: 38.0 ml LV SV MOD A2C:     46.4 ml LV SV MOD A4C:     86.3 ml LV SV MOD BP:      49.8 ml RIGHT VENTRICLE RV S prime:     10.10 cm/s TAPSE (M-mode): 1.8 cm LEFT ATRIUM           Index       RIGHT ATRIUM          Index LA diam:      2.90 cm 1.73 cm/m  RA Area:     7.56 cm LA Vol (A2C): 25.2 ml 15.02 ml/m RA Volume:   13.00 ml 7.75 ml/m LA Vol (A4C): 28.8 ml 17.17 ml/m  AORTIC VALVE LVOT Vmax:   84.90 cm/s LVOT  Vmean:  60.200 cm/s LVOT VTI:    0.181 m  AORTA Ao Root diam: 3.00 cm MITRAL VALVE MV Area (PHT): 2.91 cm    SHUNTS MV  Decel Time: 261 msec    Systemic VTI:  0.18 m MV E velocity: 71.60 cm/s  Systemic Diam: 1.90 cm MV A velocity: 94.30 cm/s MV E/A ratio:  0.76 Jenkins Rouge MD Electronically signed by Jenkins Rouge MD Signature Date/Time: 06/11/2020/10:19:14 AM    Final    CT HEAD CODE STROKE WO CONTRAST  Result Date: 06/09/2020 CLINICAL DATA:  Code stroke.  Neuro deficit, acute, stroke suspected EXAM: CT HEAD WITHOUT CONTRAST TECHNIQUE: Contiguous axial images were obtained from the base of the skull through the vertex without intravenous contrast. COMPARISON:  06/17/2018 FINDINGS: Brain: No acute infarct or intracranial hemorrhage. No mass lesion. No midline shift, ventriculomegaly or extra-axial fluid collection. Scattered and confluent supratentorial white matter hypodensities likely reflect sequela of multiple sclerosis. Chronic lacunar insult involving the left internal capsule posterior limb. Vascular: No hyperdense vessel or unexpected calcification. Carotid siphon atherosclerotic calcifications. Skull: Negative for fracture or focal lesion. Sinuses/Orbits: No acute orbital finding. Small sphenoid sinus mucous retention cyst. Other: None. ASPECTS [facility] Stroke Program Early CT Score) - Ganglionic level infarction (caudate, lentiform nuclei, internal capsule, insula, M1-M3 cortex): 7 - Supraganglionic infarction (M4-M6 cortex): 3 Total score (0-10 with 10 being normal): 10 IMPRESSION: 1. No acute intracranial process. Supratentorial demyelinating lesions. 2. Chronic left internal capsule lacunar insult. 3. ASPECTS is 10 Code stroke imaging results were communicated on 06/09/2020 at 8:09 am to provider Dr Curly [place] via secure text paging. Electronically Signed   By: Primitivo Gauze M.D.   On: 06/09/2020 08:11   VAS US CAROTID  Result Date: 06/10/2020 Carotid Arterial Duplex Study Indications:       CVA. Risk Factors:      Hypertension, hyperlipidemia, Diabetes. Comparison Study:  No prior studies. Performing  Technologist: Oliver Hum RVT  Examination Guidelines: A complete evaluation includes B-mode imaging, spectral Doppler, color Doppler, and power Doppler as needed of all accessible portions of each vessel. Bilateral testing is considered an integral part of a complete examination. Limited examinations for reoccurring indications may be performed as noted.  Right Carotid Findings: +----------+--------+--------+--------+-----------------------+--------+           PSV cm/sEDV cm/sStenosisPlaque Description     Comments +----------+--------+--------+--------+-----------------------+--------+ CCA Prox  50      7               smooth and heterogenous         +----------+--------+--------+--------+-----------------------+--------+ CCA Distal79      15              smooth and heterogenous         +----------+--------+--------+--------+-----------------------+--------+ ICA Prox  81      24              smooth and heterogenous         +----------+--------+--------+--------+-----------------------+--------+ ICA Distal61      17                                     tortuous +----------+--------+--------+--------+-----------------------+--------+ ECA       91      9                                               +----------+--------+--------+--------+-----------------------+--------+ +----------+--------+-------+--------+-------------------+  PSV cm/sEDV cmsDescribeArm Pressure (mmHG) +----------+--------+-------+--------+-------------------+ [id number]                                        +----------+--------+-------+--------+-------------------+ +---------+--------+--+--------+--+---------+ VertebralPSV cm/s48EDV cm/s11Antegrade +---------+--------+--+--------+--+---------+  Left Carotid Findings: +----------+--------+--------+--------+-----------------------+--------+           PSV cm/sEDV cm/sStenosisPlaque Description     Comments  +----------+--------+--------+--------+-----------------------+--------+ CCA Prox  77      13              smooth and heterogenous         +----------+--------+--------+--------+-----------------------+--------+ CCA Distal66      12              smooth and heterogenous         +----------+--------+--------+--------+-----------------------+--------+ ICA Prox  84      27              smooth and heterogenoustortuous +----------+--------+--------+--------+-----------------------+--------+ ICA Distal69      20                                     tortuous +----------+--------+--------+--------+-----------------------+--------+ ECA       66      10                                              +----------+--------+--------+--------+-----------------------+--------+ +----------+--------+--------+--------+-------------------+           PSV cm/sEDV cm/sDescribeArm Pressure (mmHG) +----------+--------+--------+--------+-------------------+ Subclavian129                                         +----------+--------+--------+--------+-------------------+ +---------+--------+--+--------+-+---------+ VertebralPSV cm/s44EDV cm/s7Antegrade +---------+--------+--+--------+-+---------+   Summary: Right Carotid: Velocities in the right ICA are consistent with a 1-39% stenosis. Left Carotid: Velocities in the left ICA are consistent with a 1-39% stenosis. Vertebrals: Bilateral vertebral arteries demonstrate antegrade flow. *See table(s) above for measurements and observations.  Electronically signed by Deitra Mayo MD on 06/10/2020 at 4:13:23 PM.    Final       Subjective:  Patient seen and examined the bedside this morning.  Hemodynamically stable for discharge today.  Discharge Exam: Vitals:   06/12/20 0755 06/12/20 1242  BP: (!) 149/78 128/74  Pulse: 79 81  Resp: 16 17  Temp: 98.6 F (37 C) 98.5 F (36.9 C)  SpO2: 100% 99%   Vitals:   06/11/20 2359  06/12/20 0424 06/12/20 0755 06/12/20 1242  BP: (!) 172/89 (!) 179/81 (!) 149/78 128/74  Pulse: 85 82 79 81  Resp: '18 18 16 17  '$ Temp: 98.7 F (37.1 C) 98.4 F (36.9 C) 98.6 F (37 C) 98.5 F (36.9 C)  TempSrc: Oral Oral Oral Oral  SpO2: 100% 98% 100% 99%  Weight:      Height:        General: Pt is alert, awake, not in acute distress Cardiovascular: RRR, S1/S2 +, no rubs, no gallops Respiratory: CTA bilaterally, no wheezing, no rhonchi Abdominal: Soft, NT, ND, bowel sounds + Extremities: no edema, no cyanosis    The results of significant diagnostics from this hospitalization (including imaging, microbiology, ancillary and laboratory) are listed below for reference.  Microbiology: Recent Results (from the past 240 hour(s))  SARS CORONAVIRUS 2 (TAT 6-24 HRS) Nasopharyngeal Nasopharyngeal Swab     Status: None   Collection Time: 06/09/20  9:15 AM   Specimen: Nasopharyngeal Swab  Result Value Ref Range Status   SARS Coronavirus 2 NEGATIVE NEGATIVE Final    Comment: (NOTE) SARS-CoV-2 target nucleic acids are NOT DETECTED.  The SARS-CoV-2 RNA is generally detectable in upper and lower respiratory specimens during the acute phase of infection. Negative results do not preclude SARS-CoV-2 infection, do not rule out co-infections with other pathogens, and should not be used as the sole basis for treatment or other patient management decisions. Negative results must be combined with clinical observations, patient history, and epidemiological information. The expected result is Negative.  Fact Sheet for Patients: SugarRoll.be  Fact Sheet for Healthcare Providers: https://www.woods-mathews.com/  This test is not yet approved or cleared by the [place] FDA and  has been authorized for detection and/or diagnosis of SARS-CoV-2 by FDA under an Emergency Use Authorization (EUA). This EUA will remain  in effect (meaning this test can  be used) for the duration of the COVID-19 declaration under Se ction 564(b)(1) of the Act, 21 U.S.C. section 360bbb-3(b)(1), unless the authorization is terminated or revoked sooner.  Performed at [facility], [place] [address] [place], [place] [address]      Labs: BNP (last 3 results) No results for input(s): BNP in the last 8760 hours. Basic Metabolic Panel: Recent Labs  Lab 06/09/20 0750 06/09/20 0803 06/11/20 1205 06/12/20 0204  NA 138 139 138 140  K 3.8 3.8 3.4* 3.6  CL 103 102 104 109  CO2 24  --  25 22  GLUCOSE 161* 157* 199* 63*  BUN 39* 42* 30* 31*  CREATININE 4.11* 4.30* 3.70* 3.67*  CALCIUM 9.2  --  8.9 8.7*   Liver Function Tests: Recent Labs  Lab 06/09/20 0750  AST 17  ALT 10  ALKPHOS 67  BILITOT 0.4  PROT 7.6  ALBUMIN 3.1*   No results for input(s): LIPASE, AMYLASE in the last 168 hours. No results for input(s): AMMONIA in the last 168 hours. CBC: Recent Labs  Lab 06/09/20 0750 06/09/20 0803  WBC 6.3  --   NEUTROABS 3.9  --   HGB 11.0* 10.5*  HCT 34.0* 31.0*  MCV 87.0  --   PLT 244  --    Cardiac Enzymes: No results for input(s): CKTOTAL, CKMB, CKMBINDEX, TROPONINI in the last 168 hours. BNP: Invalid input(s): POCBNP CBG: Recent Labs  Lab 06/11/20 2140 06/12/20 0445 06/12/20 0542 06/12/20 1044 06/12/20 1142  GLUCAP 126* 46* 165* 53* 133*   D-Dimer No results for input(s): DDIMER in the last 72 hours. Hgb A1c Recent Labs    06/10/20 0543  HGBA1C 8.9*   Lipid Profile Recent Labs    06/10/20 0543  CHOL 133  HDL 54  LDLCALC 56  TRIG 113  CHOLHDL 2.5   Thyroid function studies Recent Labs    06/09/20 1635  TSH 0.623   Anemia work up No results for input(s): VITAMINB12, FOLATE, FERRITIN, TIBC, IRON, RETICCTPCT in the last 72 hours. Urinalysis No results found for: COLORURINE, APPEARANCEUR, [place], [place], GLUCOSEU, Micro, [place], [place], PROTEINUR, UROBILINOGEN, NITRITE, LEUKOCYTESUR Sepsis  Labs Invalid input(s): PROCALCITONIN,  WBC,  LACTICIDVEN Microbiology Recent Results (from the past 240 hour(s))  SARS CORONAVIRUS 2 (TAT 6-24 HRS) Nasopharyngeal Nasopharyngeal Swab     Status: None   Collection Time: 06/09/20  9:15 AM  Specimen: Nasopharyngeal Swab  Result Value Ref Range Status   SARS Coronavirus 2 NEGATIVE NEGATIVE Final    Comment: (NOTE) SARS-CoV-2 target nucleic acids are NOT DETECTED.  The SARS-CoV-2 RNA is generally detectable in upper and lower respiratory specimens during the acute phase of infection. Negative results do not preclude SARS-CoV-2 infection, do not rule out co-infections with other pathogens, and should not be used as the sole basis for treatment or other patient management decisions. Negative results must be combined with clinical observations, patient history, and epidemiological information. The expected result is Negative.  Fact Sheet for Patients: SugarRoll.be  Fact Sheet for Healthcare Providers: https://www.woods-mathews.com/  This test is not yet approved or cleared by the [place] FDA and  has been authorized for detection and/or diagnosis of SARS-CoV-2 by FDA under an Emergency Use Authorization (EUA). This EUA will remain  in effect (meaning this test can be used) for the duration of the COVID-19 declaration under Se ction 564(b)(1) of the Act, 21 U.S.C. section 360bbb-3(b)(1), unless the authorization is terminated or revoked sooner.  Performed at [facility], [place] [address], [place], [place] [address]     Please note: You were cared for by a hospitalist during your hospital stay. Once you are discharged, your primary care physician will handle any further medical issues. Please note that NO REFILLS for any discharge medications will be authorized once you are discharged, as it is imperative that you return to your primary care physician (or establish a relationship with  a primary care physician if you do not have one) for your post hospital discharge needs so that they can reassess your need for medications and monitor your lab values.    Time coordinating discharge: 40 minutes  SIGNED:   Shelly Coss, MD  [facility] 06/12/2020, 1:37 PM Pager [phone]  If 7PM-7AM, please contact night-coverage [email or web address] Password [email or web address]

## 2020-06-12 NOTE — H&P (Signed)
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  Patient reportedly has missed multiple medical follow-up appointments.  Per chart review patient lives with her family.  She has assistance as needed.  Used a Rollator prior to admission.  Family assist with self-care.  Presented 06/09/2020 with dizziness and vision change as well as left-sided weakness.  Blood pressure 208/102.  Cranial CT scan negative for acute changes.  MRI showed punctate focus of restricted diffusion within the dorsal pons on the right side.  Scattered and confluent foci of T2 hyperintensity also seen within the white matter of the cerebral hemispheres and pons, nonspecific consistent with clinical diagnosis of multiple sclerosis with multiple new lesions. There is question whether these lesions are truly MS however.   MRA unremarkable.  Admission chemistries glucose 161 BUN 39 creatinine 4.11 with noted creatinine from February 2021 2.81, alcohol negative.  Patient did not receive TPA.  Currently maintained on aspirin and Plavix for CVA prophylaxis.  Subcutaneous Lovenox for DVT prophylaxis.  Therapy evaluations completed with recommendations of physical medicine rehab consult due to left-sided weakness and facial droop with slurred speech.  Patient was admitted for a comprehensive rehab program.   Review of Systems  Constitutional: Negative for chills and fever.  HENT: Negative for hearing loss.   Eyes: Positive for blurred vision.  Respiratory: Negative for cough and shortness of breath.   Cardiovascular: Positive for leg swelling. Negative for chest pain and palpitations.  Gastrointestinal: Positive for constipation. Negative for heartburn, nausea and vomiting.   Genitourinary: Positive for urgency. Negative for dysuria, flank pain and hematuria.  Musculoskeletal: Positive for joint pain and myalgias.  Skin: Negative for rash.  Neurological: Positive for dizziness, speech change and weakness.  Psychiatric/Behavioral: Positive for depression. The patient has insomnia.   All other systems reviewed and are negative.   Past Medical History:  Diagnosis Date  . Cerebral infarction ([place]) 07/12/2017    2009 first one, has had a total of 4 stokes  . Diabetes type 2, controlled ([place])    . Hemiparesis affecting right side as late effect of cerebrovascular accident (CVA) ([place])    . Hyperlipidemia    . Hypertension    . Multiple sclerosis ([place]) 2009  . Stroke [facility])           Past Surgical History:  Procedure Laterality Date  . APPENDECTOMY      . CYSTOSTOMY W/ BLADDER BIOPSY      . OVARIAN CYST SURGERY      . stent placed in heart        History reviewed. No pertinent family history. Social History:  reports that she has never smoked. She has never used smokeless tobacco. She reports previous alcohol use. She reports that she does not use drugs. Allergies:       Allergies  Allergen Reactions  . Ibuprofen Other (See Comments) and Swelling      Edema Swelling to hands, feet and face    . Influenza Vaccines Anaphylaxis      Swelling, fever  . Pneumococcal Vaccine Anaphylaxis      Swelling, fever Swelling, fever    . Promethazine Other (See Comments)      hallucinations hallucinations    . Cyclobenzaprine Other (See Comments)  hallucinations    . Oxycodone Nausea Only and Nausea And Vomiting          Medications Prior to Admission  Medication Sig Dispense Refill  . amLODipine (NORVASC) 10 MG tablet Take 10 mg by mouth daily.      . Blood Pressure Monitor DEVI 1 Units by Does not apply route 2 (two) times daily. 1 Device 0  . clopidogrel (PLAVIX) 75 MG tablet Take 75 mg by mouth daily.      . famotidine (PEPCID) 40 MG tablet Take  40 mg by mouth daily.      Marland Kitchen FLUoxetine (PROZAC) 20 MG capsule        . glipiZIDE (GLUCOTROL) 10 MG tablet Take 10 mg by mouth daily.      . hydrochlorothiazide (HYDRODIURIL) 25 MG tablet Take 25 mg by mouth daily.      . Insulin Degludec (TRESIBA [place]) Inject 35-40 Units into the skin at bedtime. Per sliding scale      . Insulin Lispro (HUMALOG KWIKPEN [place]) Inject 6 Units into the skin in the morning, at noon, and at bedtime.      Marland Kitchen losartan (COZAAR) 50 MG tablet Take 50 mg by mouth daily.      . metoprolol tartrate (LOPRESSOR) 100 MG tablet Take 100 mg by mouth 2 (two) times daily.      . nitroGLYCERIN (NITROSTAT) 0.4 MG SL tablet Place 0.4 mg under the tongue every 5 (five) minutes as needed for chest pain.      . rosuvastatin (CRESTOR) 20 MG tablet Take 20 mg by mouth daily.      . insulin lispro (HUMALOG KWIKPEN) 100 UNIT/ML KwikPen Inject into the skin.          Drug Regimen Review Drug regimen was reviewed and remains appropriate with no significant issues identified   Home: Home Living Family/patient expects to be discharged to:: Inpatient rehab Living Arrangements:  (2 adult daughters) Available Help at Discharge: Family,Friend(s),Available 24 hours/day Type of Home: House Home Access: Stairs to enter Home Layout: One level Bathroom Shower/Tub: Chiropodist: Standard Bathroom Accessibility: Yes Home Equipment: Environmental consultant - 4 wheels,Cane - single point  Lives With: Daughter (two adult daughters)   Functional History: Prior Function Level of Independence: Needs assistance Gait / Transfers Assistance Needed: rollstor with assist ADL's / Homemaking Assistance Needed: famly assist with self care   Functional Status:  Mobility: Bed Mobility Overal bed mobility: Needs Assistance Bed Mobility: Rolling,Sidelying to Sit Rolling: Mod assist Sidelying to sit: Mod assist Supine to sit: Mod assist Sit to supine: Max assist General bed mobility comments: pt with  limited use of R UE functionally to assist with rolling, hand over hand assist to reach across with R UE for L bedrail, modA to complete roll and then for trunk elevation to EOB Transfers Overall transfer level: Needs assistance Equipment used: Rolling walker (2 wheeled) Transfers: Sit to/from Stand Sit to Stand: Mod assist Stand pivot transfers: Mod assist General transfer comment: modA to power up and maintain balance during transition of hands, assist for R UE placement onto walker, completed 3 sit to stand transfers Ambulation/Gait Ambulation/Gait assistance: Mod assist,+2 physical assistance,+2 safety/equipment Gait Distance (Feet): 6 Feet (x1, 15x1) Assistive device: Rolling walker (2 wheeled) Gait Pattern/deviations: Step-through pattern,Decreased stride length,Decreased weight shift to right,Decreased weight shift to left,Decreased dorsiflexion - right,Ataxic,Narrow base of support General Gait Details: pt with with anterior pitch forward and very spastic, ataxic like gait pattern. PT provided posterior trunk support  via hands under pt's arm pits to front of shoulders to assist with trunk extension and stability, tactile cues for weight shifting and modA to advance R LE during swing phase, occasionally able to complete without assist. PT tech assist with walker management and chair follow Gait velocity: dec   ADL: ADL Overall ADL's : Needs assistance/impaired Eating/Feeding: Set up,Sitting Grooming: Set up,Sitting Upper Body Bathing: Minimal assistance,Sitting Lower Body Bathing: Moderate assistance,Sitting/lateral leans,Sit to/from stand Upper Body Dressing : Minimal assistance,Sitting Lower Body Dressing: Moderate assistance,Sitting/lateral leans,Sit to/from stand Toilet Transfer: Moderate assistance,RW,Stand-pivot Toileting- Clothing Manipulation and Hygiene: Moderate assistance,Sit to/from stand,Sitting/lateral lean Functional mobility during ADLs: Moderate assistance,Rolling  walker,Cueing for safety General ADL Comments: Pt limited by decreased strength, decreased ability to care for self and decreased activity tolerance.   Cognition: Cognition Overall Cognitive Status: Within Functional Limits for tasks assessed Orientation Level: Oriented X4 Cognition Arousal/Alertness: Awake/alert Behavior During Therapy: WFL for tasks assessed/performed Overall Cognitive Status: Within Functional Limits for tasks assessed General Comments: able to follow all commands, A&Ox4, good insight to deficits and safety, difficulty sequencing due to vision impairment and co-ordination deficits   Physical Exam: Blood pressure (!) 179/81, pulse 82, temperature 98.4 F (36.9 C), temperature source Oral, resp. rate 18, height '5\' 2"'$  (1.575 m), weight 59.9 kg, SpO2 98 %. Physical Exam Vitals reviewed.  Constitutional:      Appearance: She is obese.  HENT:     Head: Normocephalic and atraumatic.     Right Ear: External ear normal.     Left Ear: External ear normal.     Nose: Nose normal.     Mouth/Throat:     Mouth: Mucous membranes are moist.     Pharynx: No oropharyngeal exudate.  Eyes:     Conjunctiva/sclera: Conjunctivae normal.     Pupils: Pupils are equal, round, and reactive to light.  Cardiovascular:     Rate and Rhythm: Normal rate and regular rhythm.     Heart sounds: No murmur heard. No gallop.   Pulmonary:     Effort: Pulmonary effort is normal. No respiratory distress.     Breath sounds: No wheezing.  Abdominal:     General: Bowel sounds are normal. There is no distension.     Palpations: Abdomen is soft.     Tenderness: There is no abdominal tenderness.  Musculoskeletal:     Cervical back: Normal range of motion.  Skin:    General: Skin is warm.  Neurological:     General: No focal deficit present.     Mental Status: She is alert.     Comments: Patient is alert in no acute distress.  Speech is a bit dysarthric but intelligible. Right central 7. Gaze  remains slightly dysconjugate although right medial rectus crosses midline today. Still with diplopia.  Follows commands.  Provides her name and age with some delay in processing.  Fair insight and awareness. RUE 3- to 3/5 prox to distal. RLE 2/5 HF, KE and 1/5 ADF/PF. Right heel cord tight. LUE 4-/5 LLE 4/5. DTR's 2++ on right 2+ on left. Senses pain and light touch on all 4's.   Psychiatric:     Comments: Very pleasant, smiling, cordial. ?pseudobulbar affect       Lab Results Last 48 Hours        Results for orders placed or performed during the hospital encounter of 06/09/20 (from the past 48 hour(s))  Glucose, capillary     Status: Abnormal    Collection Time: 06/10/20 11:28 AM  Result Value Ref Range    Glucose-Capillary 135 (H) 70 - 99 mg/dL      Comment: Glucose reference range applies only to samples taken after fasting for at least 8 hours.  Glucose, capillary     Status: Abnormal    Collection Time: 06/10/20  4:19 PM  Result Value Ref Range    Glucose-Capillary 177 (H) 70 - 99 mg/dL      Comment: Glucose reference range applies only to samples taken after fasting for at least 8 hours.  Glucose, capillary     Status: Abnormal    Collection Time: 06/10/20  9:26 PM  Result Value Ref Range    Glucose-Capillary 121 (H) 70 - 99 mg/dL      Comment: Glucose reference range applies only to samples taken after fasting for at least 8 hours.  Glucose, capillary     Status: None    Collection Time: 06/11/20  3:36 AM  Result Value Ref Range    Glucose-Capillary 77 70 - 99 mg/dL      Comment: Glucose reference range applies only to samples taken after fasting for at least 8 hours.  Glucose, capillary     Status: None    Collection Time: 06/11/20  6:05 AM  Result Value Ref Range    Glucose-Capillary 73 70 - 99 mg/dL      Comment: Glucose reference range applies only to samples taken after fasting for at least 8 hours.    Comment 1 Notify RN    Glucose, capillary     Status: Abnormal     Collection Time: 06/11/20 11:31 AM  Result Value Ref Range    Glucose-Capillary 145 (H) 70 - 99 mg/dL      Comment: Glucose reference range applies only to samples taken after fasting for at least 8 hours.  Basic metabolic panel     Status: Abnormal    Collection Time: 06/11/20 12:05 PM  Result Value Ref Range    Sodium 138 135 - 145 mmol/L    Potassium 3.4 (L) 3.5 - 5.1 mmol/L    Chloride 104 98 - 111 mmol/L    CO2 25 22 - 32 mmol/L    Glucose, Bld 199 (H) 70 - 99 mg/dL      Comment: Glucose reference range applies only to samples taken after fasting for at least 8 hours.    BUN 30 (H) 6 - 20 mg/dL    Creatinine, Ser 3.70 (H) 0.44 - 1.00 mg/dL    Calcium 8.9 8.9 - 10.3 mg/dL    GFR, Estimated 14 (L) >60 mL/min      Comment: (NOTE) Calculated using the CKD-EPI Creatinine Equation (2021)      Anion gap 9 5 - 15      Comment: Performed at [facility] [address], [place], [place] [address]  Glucose, capillary     Status: Abnormal    Collection Time: 06/11/20  4:05 PM  Result Value Ref Range    Glucose-Capillary 124 (H) 70 - 99 mg/dL      Comment: Glucose reference range applies only to samples taken after fasting for at least 8 hours.  Glucose, capillary     Status: Abnormal    Collection Time: 06/11/20  9:40 PM  Result Value Ref Range    Glucose-Capillary 126 (H) 70 - 99 mg/dL      Comment: Glucose reference range applies only to samples taken after fasting for at least 8 hours.  Basic metabolic panel  Status: Abnormal    Collection Time: 06/12/20  2:04 AM  Result Value Ref Range    Sodium 140 135 - 145 mmol/L    Potassium 3.6 3.5 - 5.1 mmol/L    Chloride 109 98 - 111 mmol/L    CO2 22 22 - 32 mmol/L    Glucose, Bld 63 (L) 70 - 99 mg/dL      Comment: Glucose reference range applies only to samples taken after fasting for at least 8 hours.    BUN 31 (H) 6 - 20 mg/dL    Creatinine, Ser 3.67 (H) 0.44 - 1.00 mg/dL    Calcium 8.7 (L) 8.9 - 10.3 mg/dL    GFR,  Estimated 14 (L) >60 mL/min      Comment: (NOTE) Calculated using the CKD-EPI Creatinine Equation (2021)      Anion gap 9 5 - 15      Comment: Performed at [facility] [address], [place], [place] [address]  Glucose, capillary     Status: Abnormal    Collection Time: 06/12/20  4:45 AM  Result Value Ref Range    Glucose-Capillary 46 (L) 70 - 99 mg/dL      Comment: Glucose reference range applies only to samples taken after fasting for at least 8 hours.  Glucose, capillary     Status: Abnormal    Collection Time: 06/12/20  5:42 AM  Result Value Ref Range    Glucose-Capillary 165 (H) 70 - 99 mg/dL      Comment: Glucose reference range applies only to samples taken after fasting for at least 8 hours.       Imaging Results (Last 48 hours)  ECHOCARDIOGRAM COMPLETE   Result Date: 06/11/2020    ECHOCARDIOGRAM REPORT   Patient Name:   MIKE ROYALTY Date of Exam: 06/11/2020 Medical Rec #:  [id number]    Height:       62.0 in Accession #:    [id number]   Weight:       147.0 lb Date of Birth:  1966-04-22   BSA:          1.677 m Patient Age:    89 years     BP:           154/81 mmHg Patient Gender: F            HR:           68 bpm. Exam Location:  Inpatient Procedure: 2D Echo, Cardiac Doppler and Color Doppler Indications:    Stroke 434.91 / I163.9  History:        Patient has no prior history of Echocardiogram examinations.                 Stroke; Risk Factors:Hypertension, Diabetes, Dyslipidemia and                 Non-Smoker.  Sonographer:    Vickie Epley RDCS Referring Phys: [place]  1. Left ventricular ejection fraction, by estimation, is 60 to 65%. The left ventricle has normal function. The left ventricle has no regional wall motion abnormalities. Left ventricular diastolic parameters are consistent with Grade II diastolic dysfunction (pseudonormalization). Elevated left ventricular end-diastolic pressure.  2. Right ventricular systolic function is normal. The right  ventricular size is normal.  3. The pericardial effusion is posterior to the left ventricle.  4. The mitral valve is normal in structure. Trivial mitral valve regurgitation. No evidence of mitral stenosis.  5. The aortic valve  is normal in structure. Aortic valve regurgitation is trivial. No aortic stenosis is present.  6. The inferior vena cava is normal in size with greater than 50% respiratory variability, suggesting right atrial pressure of 3 mmHg. FINDINGS  Left Ventricle: Left ventricular ejection fraction, by estimation, is 60 to 65%. The left ventricle has normal function. The left ventricle has no regional wall motion abnormalities. The left ventricular internal cavity size was normal in size. There is  no left ventricular hypertrophy. Left ventricular diastolic parameters are consistent with Grade II diastolic dysfunction (pseudonormalization). Elevated left ventricular end-diastolic pressure. Right Ventricle: The right ventricular size is normal. No increase in right ventricular wall thickness. Right ventricular systolic function is normal. Left Atrium: Left atrial size was normal in size. Right Atrium: Right atrial size was normal in size. Pericardium: Trivial pericardial effusion is present. The pericardial effusion is posterior to the left ventricle. Mitral Valve: The mitral valve is normal in structure. Trivial mitral valve regurgitation. No evidence of mitral valve stenosis. Tricuspid Valve: The tricuspid valve is normal in structure. Tricuspid valve regurgitation is trivial. No evidence of tricuspid stenosis. Aortic Valve: The aortic valve is normal in structure. Aortic valve regurgitation is trivial. No aortic stenosis is present. Pulmonic Valve: The pulmonic valve was normal in structure. Pulmonic valve regurgitation is not visualized. No evidence of pulmonic stenosis. Aorta: The aortic root is normal in size and structure. Venous: The inferior vena cava is normal in size with greater than 50%  respiratory variability, suggesting right atrial pressure of 3 mmHg. IAS/Shunts: No atrial level shunt detected by color flow Doppler.  LEFT VENTRICLE PLAX 2D LVIDd:         3.50 cm     Diastology LVIDs:         2.70 cm     LV e' medial:    3.75 cm/s LV PW:         0.80 cm     LV E/e' medial:  19.1 LV IVS:        0.80 cm     LV e' lateral:   4.13 cm/s LVOT diam:     1.90 cm     LV E/e' lateral: 17.3 LV SV:         51 LV SV Index:   31 LVOT Area:     2.84 cm  LV Volumes (MOD) LV vol d, MOD A2C: 77.4 ml LV vol d, MOD A4C: 86.3 ml LV vol s, MOD A2C: 31.0 ml LV vol s, MOD A4C: 38.0 ml LV SV MOD A2C:     46.4 ml LV SV MOD A4C:     86.3 ml LV SV MOD BP:      49.8 ml RIGHT VENTRICLE RV S prime:     10.10 cm/s TAPSE (M-mode): 1.8 cm LEFT ATRIUM           Index       RIGHT ATRIUM          Index LA diam:      2.90 cm 1.73 cm/m  RA Area:     7.56 cm LA Vol (A2C): 25.2 ml 15.02 ml/m RA Volume:   13.00 ml 7.75 ml/m LA Vol (A4C): 28.8 ml 17.17 ml/m  AORTIC VALVE LVOT Vmax:   84.90 cm/s LVOT Vmean:  60.200 cm/s LVOT VTI:    0.181 m  AORTA Ao Root diam: 3.00 cm MITRAL VALVE MV Area (PHT): 2.91 cm    SHUNTS MV Decel Time: 261 msec  Systemic VTI:  0.18 m MV E velocity: 71.60 cm/s  Systemic Diam: 1.90 cm MV A velocity: 94.30 cm/s MV E/A ratio:  0.76 Jenkins Rouge MD Electronically signed by Jenkins Rouge MD Signature Date/Time: 06/11/2020/10:19:14 AM    Final     VAS US CAROTID   Result Date: 06/10/2020 Carotid Arterial Duplex Study Indications:       CVA. Risk Factors:      Hypertension, hyperlipidemia, Diabetes. Comparison Study:  No prior studies. Performing Technologist: Oliver Hum RVT  Examination Guidelines: A complete evaluation includes B-mode imaging, spectral Doppler, color Doppler, and power Doppler as needed of all accessible portions of each vessel. Bilateral testing is considered an integral part of a complete examination. Limited examinations for reoccurring indications may be performed as noted.  Right  Carotid Findings: +----------+--------+--------+--------+-----------------------+--------+           PSV cm/sEDV cm/sStenosisPlaque Description     Comments +----------+--------+--------+--------+-----------------------+--------+ CCA Prox  50      7               smooth and heterogenous         +----------+--------+--------+--------+-----------------------+--------+ CCA Distal79      15              smooth and heterogenous         +----------+--------+--------+--------+-----------------------+--------+ ICA Prox  81      24              smooth and heterogenous         +----------+--------+--------+--------+-----------------------+--------+ ICA Distal61      17                                     tortuous +----------+--------+--------+--------+-----------------------+--------+ ECA       91      9                                               +----------+--------+--------+--------+-----------------------+--------+ +----------+--------+-------+--------+-------------------+           PSV cm/sEDV cmsDescribeArm Pressure (mmHG) +----------+--------+-------+--------+-------------------+ [id number]                                        +----------+--------+-------+--------+-------------------+ +---------+--------+--+--------+--+---------+ VertebralPSV cm/s48EDV cm/s11Antegrade +---------+--------+--+--------+--+---------+  Left Carotid Findings: +----------+--------+--------+--------+-----------------------+--------+           PSV cm/sEDV cm/sStenosisPlaque Description     Comments +----------+--------+--------+--------+-----------------------+--------+ CCA Prox  77      13              smooth and heterogenous         +----------+--------+--------+--------+-----------------------+--------+ CCA Distal66      12              smooth and heterogenous         +----------+--------+--------+--------+-----------------------+--------+ ICA Prox  84       27              smooth and heterogenoustortuous +----------+--------+--------+--------+-----------------------+--------+ ICA Distal69      20                                     tortuous +----------+--------+--------+--------+-----------------------+--------+  ECA       66      10                                              +----------+--------+--------+--------+-----------------------+--------+ +----------+--------+--------+--------+-------------------+           PSV cm/sEDV cm/sDescribeArm Pressure (mmHG) +----------+--------+--------+--------+-------------------+ Subclavian129                                         +----------+--------+--------+--------+-------------------+ +---------+--------+--+--------+-+---------+ VertebralPSV cm/s44EDV cm/s7Antegrade +---------+--------+--+--------+-+---------+   Summary: Right Carotid: Velocities in the right ICA are consistent with a 1-39% stenosis. Left Carotid: Velocities in the left ICA are consistent with a 1-39% stenosis. Vertebrals: Bilateral vertebral arteries demonstrate antegrade flow. *See table(s) above for measurements and observations.  Electronically signed by Deitra Mayo MD on 06/10/2020 at 4:13:23 PM.    Final              Medical Problem List and Plan: 1.  Visual changes and functional deficits secondary to acute nonhemorrhagic dorsal right pontine infarct as well as history of multiple CVA in the past with right-sided spastic weakness and slurred speech. ?MS lesions also             -patient may  shower             -ELOS/Goals: 15-21 days, supervision to min assist with PT, OT, SLP 2.  Antithrombotics: -DVT/anticoagulation: Subcutaneous heparin             -antiplatelet therapy: Aspirin 81 mg daily and Plavix 75 mg daily 3. Pain Management: Tylenol as needed 4. Mood: Prozac 20 mg daily             -antipsychotic agents: N/A 5. Neuropsych: This patient is capable of making decisions on her  own behalf. 6. Skin/Wound Care: Routine skin checks 7. Fluids/Electrolytes/Nutrition: Routine in and outs with follow-up chemistries 8.  Hypertension.  Norvasc 10 mg daily, Lopressor 100 mg twice daily.  Monitor with increased mobility 9.  Diabetes mellitus.  Hemoglobin A1c 8.9.  Lantus insulin 35 units nightly.  Check blood sugars before meals and at bedtime.               -team to provide diabetic teaching 10.  CKD stage IV.  Latest baseline creatinine from February 2021 was 2.81.  Follow-up chemistries 11.  ? Multiple sclerosis.  Self-reported. Multiple new lesions on MRI c/w with diagnosis but pt apparently never has been treated.              -pt to f/u with Dr. Leonie Man as outpt to further explore.  12.  GERD.  Pepcid 13.  Hyperlipidemia.  Crestor   Lavon Paganini Angiulli, PA-C 06/12/2020  I have personally performed a face to face diagnostic evaluation of this patient and formulated the key components of the plan.  Additionally, I have personally reviewed laboratory data, imaging studies, as well as relevant notes and concur with the physician assistant's documentation above.  The patient's status has not changed from the original H&P.  Any changes in documentation from the acute care chart have been noted above.  Meredith Staggers, MD, Mellody Drown

## 2020-06-12 NOTE — H&P (Signed)
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  Patient reportedly has missed multiple medical follow-up appointments.  Per chart review patient lives with her family.  She has assistance as needed.  Used a Rollator prior to admission.  Family assist with self-care.  Presented 06/09/2020 with dizziness and vision change as well as left-sided weakness.  Blood pressure 208/102.  Cranial CT scan negative for acute changes.  MRI showed punctate focus of restricted diffusion within the dorsal pons on the right side.  Scattered and confluent foci of T2 hyperintensity also seen within the white matter of the cerebral hemispheres and pons, nonspecific consistent with clinical diagnosis of multiple sclerosis with multiple new lesions. There is question whether these lesions are truly MS however.   MRA unremarkable.  Admission chemistries glucose 161 BUN 39 creatinine 4.11 with noted creatinine from February 2021 2.81, alcohol negative.  Patient did not receive TPA.  Currently maintained on aspirin and Plavix for CVA prophylaxis.  Subcutaneous Lovenox for DVT prophylaxis.  Therapy evaluations completed with recommendations of physical medicine rehab consult due to left-sided weakness and facial droop with slurred speech.  Patient was admitted for a comprehensive rehab program.  Review of Systems  Constitutional: Negative for chills and fever.  HENT: Negative for hearing loss.   Eyes: Positive for blurred vision.  Respiratory: Negative for cough and shortness of breath.   Cardiovascular: Positive for leg swelling. Negative for chest pain and palpitations.  Gastrointestinal: Positive for constipation. Negative for heartburn, nausea and vomiting.   Genitourinary: Positive for urgency. Negative for dysuria, flank pain and hematuria.  Musculoskeletal: Positive for joint pain and myalgias.  Skin: Negative for rash.  Neurological: Positive for dizziness, speech change and weakness.  Psychiatric/Behavioral: Positive for depression. The patient has insomnia.   All other systems reviewed and are negative.  Past Medical History:  Diagnosis Date  . Cerebral infarction ([place]) 07/12/2017   2009 first one, has had a total of 4 stokes  . Diabetes type 2, controlled ([place])   . Hemiparesis affecting right side as late effect of cerebrovascular accident (CVA) ([place])   . Hyperlipidemia   . Hypertension   . Multiple sclerosis ([place]) 2009  . Stroke [facility])    Past Surgical History:  Procedure Laterality Date  . APPENDECTOMY    . CYSTOSTOMY W/ BLADDER BIOPSY    . OVARIAN CYST SURGERY    . stent placed in heart     History reviewed. No pertinent family history. Social History:  reports that she has never smoked. She has never used smokeless tobacco. She reports previous alcohol use. She reports that she does not use drugs. Allergies:  Allergies  Allergen Reactions  . Ibuprofen Other (See Comments) and Swelling    Edema Swelling to hands, feet and face   . Influenza Vaccines Anaphylaxis    Swelling, fever  . Pneumococcal Vaccine Anaphylaxis    Swelling, fever Swelling, fever   . Promethazine Other (See Comments)    hallucinations hallucinations   . Cyclobenzaprine Other (See Comments)    hallucinations   . Oxycodone Nausea Only and Nausea And Vomiting   Medications Prior to Admission  Medication Sig Dispense Refill  . amLODipine (NORVASC) 10 MG tablet Take 10 mg by mouth daily.    . Blood Pressure  Monitor DEVI 1 Units by Does not apply route 2 (two) times daily. 1 Device 0  . clopidogrel (PLAVIX) 75 MG tablet Take 75 mg by mouth daily.    . famotidine (PEPCID) 40 MG tablet Take 40 mg by mouth daily.    Marland Kitchen FLUoxetine (PROZAC) 20 MG  capsule     . glipiZIDE (GLUCOTROL) 10 MG tablet Take 10 mg by mouth daily.    . hydrochlorothiazide (HYDRODIURIL) 25 MG tablet Take 25 mg by mouth daily.    . Insulin Degludec (TRESIBA [place]) Inject 35-40 Units into the skin at bedtime. Per sliding scale    . Insulin Lispro (HUMALOG KWIKPEN [place]) Inject 6 Units into the skin in the morning, at noon, and at bedtime.    Marland Kitchen losartan (COZAAR) 50 MG tablet Take 50 mg by mouth daily.    . metoprolol tartrate (LOPRESSOR) 100 MG tablet Take 100 mg by mouth 2 (two) times daily.    . nitroGLYCERIN (NITROSTAT) 0.4 MG SL tablet Place 0.4 mg under the tongue every 5 (five) minutes as needed for chest pain.    . rosuvastatin (CRESTOR) 20 MG tablet Take 20 mg by mouth daily.    . insulin lispro (HUMALOG KWIKPEN) 100 UNIT/ML KwikPen Inject into the skin.      Drug Regimen Review Drug regimen was reviewed and remains appropriate with no significant issues identified  Home: Home Living Family/patient expects to be discharged to:: Inpatient rehab Living Arrangements:  (2 adult daughters) Available Help at Discharge: Family,Friend(s),Available 24 hours/day Type of Home: House Home Access: Stairs to enter Home Layout: One level Bathroom Shower/Tub: Chiropodist: Standard Bathroom Accessibility: Yes Home Equipment: Environmental consultant - 4 wheels,Cane - single point  Lives With: Daughter (two adult daughters)   Functional History: Prior Function Level of Independence: Needs assistance Gait / Transfers Assistance Needed: rollstor with assist ADL's / Homemaking Assistance Needed: famly assist with self care  Functional Status:  Mobility: Bed Mobility Overal bed mobility: Needs Assistance Bed Mobility: Rolling,Sidelying to Sit Rolling: Mod assist Sidelying to sit: Mod assist Supine to sit: Mod assist Sit to supine: Max assist General bed mobility comments: pt with limited use of R UE functionally to assist with rolling, hand over hand assist to  reach across with R UE for L bedrail, modA to complete roll and then for trunk elevation to EOB Transfers Overall transfer level: Needs assistance Equipment used: Rolling walker (2 wheeled) Transfers: Sit to/from Stand Sit to Stand: Mod assist Stand pivot transfers: Mod assist General transfer comment: modA to power up and maintain balance during transition of hands, assist for R UE placement onto walker, completed 3 sit to stand transfers Ambulation/Gait Ambulation/Gait assistance: Mod assist,+2 physical assistance,+2 safety/equipment Gait Distance (Feet): 6 Feet (x1, 15x1) Assistive device: Rolling walker (2 wheeled) Gait Pattern/deviations: Step-through pattern,Decreased stride length,Decreased weight shift to right,Decreased weight shift to left,Decreased dorsiflexion - right,Ataxic,Narrow base of support General Gait Details: pt with with anterior pitch forward and very spastic, ataxic like gait pattern. PT provided posterior trunk support via hands under pt's arm pits to front of shoulders to assist with trunk extension and stability, tactile cues for weight shifting and modA to advance R LE during swing phase, occasionally able to complete without assist. PT tech assist with walker management and chair follow Gait velocity: dec    ADL: ADL Overall ADL's : Needs assistance/impaired Eating/Feeding: Set up,Sitting Grooming: Set up,Sitting Upper Body Bathing: Minimal assistance,Sitting Lower Body Bathing: Moderate assistance,Sitting/lateral leans,Sit to/from stand Upper Body  Dressing : Minimal assistance,Sitting Lower Body Dressing: Moderate assistance,Sitting/lateral leans,Sit to/from stand Toilet Transfer: Moderate assistance,RW,Stand-pivot Toileting- Clothing Manipulation and Hygiene: Moderate assistance,Sit to/from stand,Sitting/lateral lean Functional mobility during ADLs: Moderate assistance,Rolling walker,Cueing for safety General ADL Comments: Pt limited by decreased strength,  decreased ability to care for self and decreased activity tolerance.  Cognition: Cognition Overall Cognitive Status: Within Functional Limits for tasks assessed Orientation Level: Oriented X4 Cognition Arousal/Alertness: Awake/alert Behavior During Therapy: WFL for tasks assessed/performed Overall Cognitive Status: Within Functional Limits for tasks assessed General Comments: able to follow all commands, A&Ox4, good insight to deficits and safety, difficulty sequencing due to vision impairment and co-ordination deficits  Physical Exam: Blood pressure (!) 179/81, pulse 82, temperature 98.4 F (36.9 C), temperature source Oral, resp. rate 18, height '5\' 2"'$  (1.575 m), weight 59.9 kg, SpO2 98 %. Physical Exam Vitals reviewed.  Constitutional:      Appearance: She is obese.  HENT:     Head: Normocephalic and atraumatic.     Right Ear: External ear normal.     Left Ear: External ear normal.     Nose: Nose normal.     Mouth/Throat:     Mouth: Mucous membranes are moist.     Pharynx: No oropharyngeal exudate.  Eyes:     Conjunctiva/sclera: Conjunctivae normal.     Pupils: Pupils are equal, round, and reactive to light.  Cardiovascular:     Rate and Rhythm: Normal rate and regular rhythm.     Heart sounds: No murmur heard. No gallop.   Pulmonary:     Effort: Pulmonary effort is normal. No respiratory distress.     Breath sounds: No wheezing.  Abdominal:     General: Bowel sounds are normal. There is no distension.     Palpations: Abdomen is soft.     Tenderness: There is no abdominal tenderness.  Musculoskeletal:     Cervical back: Normal range of motion.  Skin:    General: Skin is warm.  Neurological:     General: No focal deficit present.     Mental Status: She is alert.     Comments: Patient is alert in no acute distress.  Speech is a bit dysarthric but intelligible. Right central 7. Gaze remains slightly dysconjugate although right medial rectus crosses midline today. Still  with diplopia.  Follows commands.  Provides her name and age with some delay in processing.  Fair insight and awareness. RUE 3- to 3/5 prox to distal. RLE 2/5 HF, KE and 1/5 ADF/PF. Right heel cord tight. DTR's 2++ on right 2+ on left. Senses pain and light touch on all 4's.   Psychiatric:     Comments: Very pleasant, smiling, cordial.      Results for orders placed or performed during the hospital encounter of 06/09/20 (from the past 48 hour(s))  Glucose, capillary     Status: Abnormal   Collection Time: 06/10/20 11:28 AM  Result Value Ref Range   Glucose-Capillary 135 (H) 70 - 99 mg/dL    Comment: Glucose reference range applies only to samples taken after fasting for at least 8 hours.  Glucose, capillary     Status: Abnormal   Collection Time: 06/10/20  4:19 PM  Result Value Ref Range   Glucose-Capillary 177 (H) 70 - 99 mg/dL    Comment: Glucose reference range applies only to samples taken after fasting for at least 8 hours.  Glucose, capillary     Status: Abnormal   Collection Time: 06/10/20  9:26 PM  Result Value Ref Range   Glucose-Capillary 121 (H) 70 - 99 mg/dL    Comment: Glucose reference range applies only to samples taken after fasting for at least 8 hours.  Glucose, capillary     Status: None   Collection Time: 06/11/20  3:36 AM  Result Value Ref Range   Glucose-Capillary 77 70 - 99 mg/dL    Comment: Glucose reference range applies only to samples taken after fasting for at least 8 hours.  Glucose, capillary     Status: None   Collection Time: 06/11/20  6:05 AM  Result Value Ref Range   Glucose-Capillary 73 70 - 99 mg/dL    Comment: Glucose reference range applies only to samples taken after fasting for at least 8 hours.   Comment 1 Notify RN   Glucose, capillary     Status: Abnormal   Collection Time: 06/11/20 11:31 AM  Result Value Ref Range   Glucose-Capillary 145 (H) 70 - 99 mg/dL    Comment: Glucose reference range applies only to samples taken after fasting  for at least 8 hours.  Basic metabolic panel     Status: Abnormal   Collection Time: 06/11/20 12:05 PM  Result Value Ref Range   Sodium 138 135 - 145 mmol/L   Potassium 3.4 (L) 3.5 - 5.1 mmol/L   Chloride 104 98 - 111 mmol/L   CO2 25 22 - 32 mmol/L   Glucose, Bld 199 (H) 70 - 99 mg/dL    Comment: Glucose reference range applies only to samples taken after fasting for at least 8 hours.   BUN 30 (H) 6 - 20 mg/dL   Creatinine, Ser 3.70 (H) 0.44 - 1.00 mg/dL   Calcium 8.9 8.9 - 10.3 mg/dL   GFR, Estimated 14 (L) >60 mL/min    Comment: (NOTE) Calculated using the CKD-EPI Creatinine Equation (2021)    Anion gap 9 5 - 15    Comment: Performed at [facility] [address], [place], [place] [address]  Glucose, capillary     Status: Abnormal   Collection Time: 06/11/20  4:05 PM  Result Value Ref Range   Glucose-Capillary 124 (H) 70 - 99 mg/dL    Comment: Glucose reference range applies only to samples taken after fasting for at least 8 hours.  Glucose, capillary     Status: Abnormal   Collection Time: 06/11/20  9:40 PM  Result Value Ref Range   Glucose-Capillary 126 (H) 70 - 99 mg/dL    Comment: Glucose reference range applies only to samples taken after fasting for at least 8 hours.  Basic metabolic panel     Status: Abnormal   Collection Time: 06/12/20  2:04 AM  Result Value Ref Range   Sodium 140 135 - 145 mmol/L   Potassium 3.6 3.5 - 5.1 mmol/L   Chloride 109 98 - 111 mmol/L   CO2 22 22 - 32 mmol/L   Glucose, Bld 63 (L) 70 - 99 mg/dL    Comment: Glucose reference range applies only to samples taken after fasting for at least 8 hours.   BUN 31 (H) 6 - 20 mg/dL   Creatinine, Ser 3.67 (H) 0.44 - 1.00 mg/dL   Calcium 8.7 (L) 8.9 - 10.3 mg/dL   GFR, Estimated 14 (L) >60 mL/min    Comment: (NOTE) Calculated using the CKD-EPI Creatinine Equation (2021)    Anion gap 9 5 - 15    Comment: Performed at [facility],  [place] [address]  Glucose,  capillary     Status: Abnormal   Collection Time: 06/12/20  4:45 AM  Result Value Ref Range   Glucose-Capillary 46 (L) 70 - 99 mg/dL    Comment: Glucose reference range applies only to samples taken after fasting for at least 8 hours.  Glucose, capillary     Status: Abnormal   Collection Time: 06/12/20  5:42 AM  Result Value Ref Range   Glucose-Capillary 165 (H) 70 - 99 mg/dL    Comment: Glucose reference range applies only to samples taken after fasting for at least 8 hours.   ECHOCARDIOGRAM COMPLETE  Result Date: 06/11/2020    ECHOCARDIOGRAM REPORT   Patient Name:   ISOLINA ORTEN Date of Exam: 06/11/2020 Medical Rec #:  [id number]    Height:       62.0 in Accession #:    [id number]   Weight:       147.0 lb Date of Birth:  [birth year]   BSA:          1.677 m Patient Age:    17 years     BP:           154/81 mmHg Patient Gender: F            HR:           68 bpm. Exam Location:  Inpatient Procedure: 2D Echo, Cardiac Doppler and Color Doppler Indications:    Stroke 434.91 / I163.9  History:        Patient has no prior history of Echocardiogram examinations.                 Stroke; Risk Factors:Hypertension, Diabetes, Dyslipidemia and                 Non-Smoker.  Sonographer:    Vickie Epley RDCS Referring Phys: [place]  1. Left ventricular ejection fraction, by estimation, is 60 to 65%. The left ventricle has normal function. The left ventricle has no regional wall motion abnormalities. Left ventricular diastolic parameters are consistent with Grade II diastolic dysfunction (pseudonormalization). Elevated left ventricular end-diastolic pressure.  2. Right ventricular systolic function is normal. The right ventricular size is normal.  3. The pericardial effusion is posterior to the left ventricle.  4. The mitral valve is normal in structure. Trivial mitral valve regurgitation. No evidence of mitral stenosis.  5. The aortic valve is normal in structure. Aortic valve regurgitation is  trivial. No aortic stenosis is present.  6. The inferior vena cava is normal in size with greater than 50% respiratory variability, suggesting right atrial pressure of 3 mmHg. FINDINGS  Left Ventricle: Left ventricular ejection fraction, by estimation, is 60 to 65%. The left ventricle has normal function. The left ventricle has no regional wall motion abnormalities. The left ventricular internal cavity size was normal in size. There is  no left ventricular hypertrophy. Left ventricular diastolic parameters are consistent with Grade II diastolic dysfunction (pseudonormalization). Elevated left ventricular end-diastolic pressure. Right Ventricle: The right ventricular size is normal. No increase in right ventricular wall thickness. Right ventricular systolic function is normal. Left Atrium: Left atrial size was normal in size. Right Atrium: Right atrial size was normal in size. Pericardium: Trivial pericardial effusion is present. The pericardial effusion is posterior to the left ventricle. Mitral Valve: The mitral valve is normal in structure. Trivial mitral valve regurgitation. No evidence of mitral valve stenosis. Tricuspid Valve: The tricuspid valve is normal in structure. Tricuspid valve regurgitation  is trivial. No evidence of tricuspid stenosis. Aortic Valve: The aortic valve is normal in structure. Aortic valve regurgitation is trivial. No aortic stenosis is present. Pulmonic Valve: The pulmonic valve was normal in structure. Pulmonic valve regurgitation is not visualized. No evidence of pulmonic stenosis. Aorta: The aortic root is normal in size and structure. Venous: The inferior vena cava is normal in size with greater than 50% respiratory variability, suggesting right atrial pressure of 3 mmHg. IAS/Shunts: No atrial level shunt detected by color flow Doppler.  LEFT VENTRICLE PLAX 2D LVIDd:         3.50 cm     Diastology LVIDs:         2.70 cm     LV e' medial:    3.75 cm/s LV PW:         0.80 cm     LV  E/e' medial:  19.1 LV IVS:        0.80 cm     LV e' lateral:   4.13 cm/s LVOT diam:     1.90 cm     LV E/e' lateral: 17.3 LV SV:         51 LV SV Index:   31 LVOT Area:     2.84 cm  LV Volumes (MOD) LV vol d, MOD A2C: 77.4 ml LV vol d, MOD A4C: 86.3 ml LV vol s, MOD A2C: 31.0 ml LV vol s, MOD A4C: 38.0 ml LV SV MOD A2C:     46.4 ml LV SV MOD A4C:     86.3 ml LV SV MOD BP:      49.8 ml RIGHT VENTRICLE RV S prime:     10.10 cm/s TAPSE (M-mode): 1.8 cm LEFT ATRIUM           Index       RIGHT ATRIUM          Index LA diam:      2.90 cm 1.73 cm/m  RA Area:     7.56 cm LA Vol (A2C): 25.2 ml 15.02 ml/m RA Volume:   13.00 ml 7.75 ml/m LA Vol (A4C): 28.8 ml 17.17 ml/m  AORTIC VALVE LVOT Vmax:   84.90 cm/s LVOT Vmean:  60.200 cm/s LVOT VTI:    0.181 m  AORTA Ao Root diam: 3.00 cm MITRAL VALVE MV Area (PHT): 2.91 cm    SHUNTS MV Decel Time: 261 msec    Systemic VTI:  0.18 m MV E velocity: 71.60 cm/s  Systemic Diam: 1.90 cm MV A velocity: 94.30 cm/s MV E/A ratio:  0.76 Jenkins Rouge MD Electronically signed by Jenkins Rouge MD Signature Date/Time: 06/11/2020/10:19:14 AM    Final    VAS US CAROTID  Result Date: 06/10/2020 Carotid Arterial Duplex Study Indications:       CVA. Risk Factors:      Hypertension, hyperlipidemia, Diabetes. Comparison Study:  No prior studies. Performing Technologist: Oliver Hum RVT  Examination Guidelines: A complete evaluation includes B-mode imaging, spectral Doppler, color Doppler, and power Doppler as needed of all accessible portions of each vessel. Bilateral testing is considered an integral part of a complete examination. Limited examinations for reoccurring indications may be performed as noted.  Right Carotid Findings: +----------+--------+--------+--------+-----------------------+--------+           PSV cm/sEDV cm/sStenosisPlaque Description     Comments +----------+--------+--------+--------+-----------------------+--------+ CCA Prox  50      7               smooth  and heterogenous         +----------+--------+--------+--------+-----------------------+--------+  CCA Distal79      15              smooth and heterogenous         +----------+--------+--------+--------+-----------------------+--------+ ICA Prox  81      24              smooth and heterogenous         +----------+--------+--------+--------+-----------------------+--------+ ICA Distal61      17                                     tortuous +----------+--------+--------+--------+-----------------------+--------+ ECA       91      9                                               +----------+--------+--------+--------+-----------------------+--------+ +----------+--------+-------+--------+-------------------+           PSV cm/sEDV cmsDescribeArm Pressure (mmHG) +----------+--------+-------+--------+-------------------+ [id number]                                        +----------+--------+-------+--------+-------------------+ +---------+--------+--+--------+--+---------+ VertebralPSV cm/s48EDV cm/s11Antegrade +---------+--------+--+--------+--+---------+  Left Carotid Findings: +----------+--------+--------+--------+-----------------------+--------+           PSV cm/sEDV cm/sStenosisPlaque Description     Comments +----------+--------+--------+--------+-----------------------+--------+ CCA Prox  77      13              smooth and heterogenous         +----------+--------+--------+--------+-----------------------+--------+ CCA Distal66      12              smooth and heterogenous         +----------+--------+--------+--------+-----------------------+--------+ ICA Prox  84      27              smooth and heterogenoustortuous +----------+--------+--------+--------+-----------------------+--------+ ICA Distal69      20                                     tortuous +----------+--------+--------+--------+-----------------------+--------+ ECA        66      10                                              +----------+--------+--------+--------+-----------------------+--------+ +----------+--------+--------+--------+-------------------+           PSV cm/sEDV cm/sDescribeArm Pressure (mmHG) +----------+--------+--------+--------+-------------------+ Subclavian129                                         +----------+--------+--------+--------+-------------------+ +---------+--------+--+--------+-+---------+ VertebralPSV cm/s44EDV cm/s7Antegrade +---------+--------+--+--------+-+---------+   Summary: Right Carotid: Velocities in the right ICA are consistent with a 1-39% stenosis. Left Carotid: Velocities in the left ICA are consistent with a 1-39% stenosis. Vertebrals: Bilateral vertebral arteries demonstrate antegrade flow. *See table(s) above for measurements and observations.  Electronically signed by Deitra Mayo MD on 06/10/2020 at 4:13:23 PM.    Final        Medical Problem List and Plan:  1.  Left-sided weakness secondary to acute nonhemorrhagic infarct right pontine as well as history of multiple CVA in the past with right side residual weakness and slurred speech. ?MS lesions also  -patient may  shower  -ELOS/Goals: 15-21 days, supervision to min assist with PT, OT, SLP 2.  Antithrombotics: -DVT/anticoagulation: Subcutaneous heparin  -antiplatelet therapy: Aspirin 81 mg daily and Plavix 75 mg daily 3. Pain Management: Tylenol as needed 4. Mood: Prozac 20 mg daily  -antipsychotic agents: N/A 5. Neuropsych: This patient is capable of making decisions on her own behalf. 6. Skin/Wound Care: Routine skin checks 7. Fluids/Electrolytes/Nutrition: Routine in and outs with follow-up chemistries 8.  Hypertension.  Norvasc 10 mg daily, Lopressor 100 mg twice daily.  Monitor with increased mobility 9.  Diabetes mellitus.  Hemoglobin A1c 8.9.  Lantus insulin 35 units nightly.  Check blood sugars before meals and at  bedtime.    -team to provide diabetic teaching 10.  CKD stage IV.  Latest baseline creatinine from February 2021 was 2.81.  Follow-up chemistries 11.  ? Multiple sclerosis.  Self-reported. Multiple new lesions on MRI c/w with diagnosis but pt apparently never has been treated.   -pt to f/u with Dr. Leonie Man as outpt to further explore.  12.  GERD.  Pepcid 13.  Hyperlipidemia.  Crestor  Lavon Paganini Angiulli, PA-C 06/12/2020

## 2020-06-12 NOTE — Progress Notes (Signed)
Physical Therapy Treatment Patient Details Name: Hannah [name] MRN: [id number] DOB: 16-Feb-1966 Today's Date: 06/12/2020    History of Present Illness Hannah [name] is a 55 y.o. female with medical history significant of CVA with resultant L hemiparesis; MS; HTN; HLD; CKD and DM presenting with R sided weakness, vision changes, and dyphasia.    PT Comments    Patient received in bed, eager to participate in therapy and excited about going to rehab today. Did better with bed mobility today, did have some truncal ataxia initially but this quickly faded although she did still require Min guard-MinA to maintain sitting balance statically. Able to progress gait distance today with [place] walker and Min-ModAx2 for balance and to progress R LE. Had much less to almost no ataxia with gait in [place] walker, but did need help advancing RLE and demonstrated plantar flexed/internally rotated positioning of R LE as well as tendency to bear weight on the ball of her foot possibly due to tone vs contracture? Left up in recliner with all needs met, chair alarm active. Remains a great CIR candidate!    Follow Up Recommendations  CIR     Equipment Recommendations  None recommended by PT    Recommendations for Other Services       Precautions / Restrictions Precautions Precautions: Fall Precaution Comments: R spastic hemiparesis in UE/LE Restrictions Weight Bearing Restrictions: No    Mobility  Bed Mobility Overal bed mobility: Needs Assistance Bed Mobility: Supine to Sit     Supine to sit: Min assist;HOB elevated     General bed mobility comments: HOB elevated, able to use rails to spin around to EOB and able to scoot BLEs over the side, intermittent MinA for balance as she got closer to EOB  Transfers Overall transfer level: Needs assistance Equipment used:  [place] walker) Transfers: Sit to/from Stand Sit to Stand: Mod assist;+2 physical assistance         General transfer comment: ModAx2 to  power up and place BUEs on eva walker while maintaining balance; needed assist to place BUEs into correct place but steady once we got her situated.  Ambulation/Gait Ambulation/Gait assistance: Min assist;Mod assist;+2 physical assistance Gait Distance (Feet): 10 Feet Assistive device:  (eva walker) Gait Pattern/deviations: Step-to pattern;Decreased step length - left;Decreased stance time - right;Decreased stride length;Decreased dorsiflexion - right;Decreased weight shift to right;Trendelenburg;Drifts right/left;Trunk flexed Gait velocity: dec   General Gait Details: anterior pitch much improved in Eva walker and she also was less ataxic today as well; needed MinA on her left side to help steer eva walker and for balance, and Min-ModA on her right side for balance/steadying and progressing R LE. Initially needed modA to progress R LE but progressed to [place], very little ataxia but did have some circumduction and toe drage on the R as well as IR of R LE as a whole with swing phase   Stairs             Wheelchair Mobility    Modified Rankin (Stroke Patients Only)       Balance Overall balance assessment: Needs assistance Sitting-balance support: Single extremity supported;Feet supported Sitting balance-Leahy Scale: Fair Sitting balance - Comments: min gaurd-MinA statically; mild truncal ataxia when first sitting but this rapidly improved   Standing balance support: Bilateral upper extremity supported Standing balance-Leahy Scale: Poor Standing balance comment: dependent on RW/external support  Cognition Arousal/Alertness: Awake/alert Behavior During Therapy: WFL for tasks assessed/performed Overall Cognitive Status: Within Functional Limits for tasks assessed                                 General Comments: able to follow all commands, A&Ox4, good insight to deficits and safety, difficulty sequencing due to vision  impairment and co-ordination deficits      Exercises      General Comments        Pertinent Vitals/Pain Pain Assessment: Faces Faces Pain Scale: No hurt Pain Intervention(s): Limited activity within patient's tolerance;Monitored during session    Home Living     Available Help at Discharge: Family;Friend(s);Available 24 hours/day Type of Home: House              Prior Function            PT Goals (current goals can now be found in the care plan section) Acute Rehab PT Goals Patient Stated Goal: get stronger PT Goal Formulation: With patient Time For Goal Achievement: 06/23/20 Potential to Achieve Goals: Good Progress towards PT goals: Progressing toward goals    Frequency    Min 4X/week      PT Plan Current plan remains appropriate    Co-evaluation              AM-PAC PT "6 Clicks" Mobility   Outcome Measure  Help needed turning from your back to your side while in a flat bed without using bedrails?: A Little Help needed moving from lying on your back to sitting on the side of a flat bed without using bedrails?: A Lot Help needed moving to and from a bed to a chair (including a wheelchair)?: A Lot Help needed standing up from a chair using your arms (e.g., wheelchair or bedside chair)?: A Lot Help needed to walk in hospital room?: A Lot Help needed climbing 3-5 steps with a railing? : Total 6 Click Score: 12    End of Session Equipment Utilized During Treatment: Gait belt Activity Tolerance: Patient tolerated treatment well Patient left: in chair;with call bell/phone within reach;with chair alarm set Nurse Communication: Mobility status;Need for lift equipment PT Visit Diagnosis: Unsteadiness on feet (R26.81);Muscle weakness (generalized) (M62.81);Difficulty in walking, not elsewhere classified (R26.2) Hemiplegia - Right/Left: Right Hemiplegia - dominant/non-dominant: Dominant Hemiplegia - caused by: Unspecified     Time: 3329-5188 PT  Time Calculation (min) (ACUTE ONLY): 32 min  Charges:  $Gait Training: 8-22 mins $Therapeutic Activity: 8-22 mins                     Windell Norfolk, DPT, PN1   Supplemental Physical Therapist [place]    Pager [phone] Acute Rehab Office [phone]

## 2020-06-12 NOTE — Evaluation (Signed)
Speech Language Pathology Evaluation Patient Details Name: Hannah [name] MRN: [id number] DOB: [birth year] Today's Date: 06/12/2020 Time: [id number] SLP Time Calculation (min) (ACUTE ONLY): 33 min  Problem List:  Patient Active Problem List   Diagnosis Date Noted  . Stroke-like symptoms 06/09/2020  . Acute kidney injury superimposed on CKD ([place]) 06/09/2020  . Cerebral thrombosis with cerebral infarction 06/09/2020  . Hypertension   . Hyperlipidemia   . Diabetes type 2, controlled ([place])   . Bimalleolar ankle fracture 11/24/2018  . Multiple sclerosis ([place]) 2009   Past Medical History:  Past Medical History:  Diagnosis Date  . Cerebral infarction ([place]) 07/12/2017   2009 first one, has had a total of 4 stokes  . Diabetes type 2, controlled ([place])   . Hemiparesis affecting right side as late effect of cerebrovascular accident (CVA) ([place])   . Hyperlipidemia   . Hypertension   . Multiple sclerosis ([place]) 2009  . Stroke [facility])    Past Surgical History:  Past Surgical History:  Procedure Laterality Date  . APPENDECTOMY    . CYSTOSTOMY W/ BLADDER BIOPSY    . OVARIAN CYST SURGERY    . stent placed in heart     HPI:  Patient is a 55 year old lady with history of CKD stage IV, hypertension, uncontrolled type 2 diabetes, multiple strokes with right-sided residual weakness, GERD who presented to the emergency room with complaints of new onset visual changes, worsening of right-sided weakness, slurred speech.  On presentation, she was hemodynamically stable.  MRI showed acute nonhemorrhagic stroke of right dorsal pons.   Assessment / Plan / Recommendation Clinical Impression  Pt presents with functional cognition as assessed using the COGNISTAT (see below for additional information).  Although pt peformed below average range on naming task, this was suspected to be 2/2 visual deficits.  When given an additional generative naming task, pt peformend within expected range (13 items within 1  minute). Picture description task was also impacted by visual deficits, but was grammatically correct and content was largly appropriate and where it was not, pt was able to point to items that she was misidentifiying (for example, the bridge appeared to her to be a snake or a dinosaur, and stones in bridge do, in fact seem to have a scale like pattern to them).  Pt's speech was 100% intelligible in conversation.  But she notes that sometimes she has more difficulty in long conversations.  Daughter was uncertain if speech had returned to baseline.  Pt has planned d/c to CIR today.  Pt may benefit from re-evaluation and further assessment of higher level cognitive functions. Pt has no acute ST needs at this time for communicaiton or cognition.    OF NOTE:  Pt c/o of choking with PO intake.  Drinking more than eating.  She notes that things feel like they are going down the wrong way.  She states this happens when she is rushing and that it happens about once a week. Consider swallow evaluation at next level of care as well.    COGNISTAT: All subtests are within the average range, except where otherwise specified.  Orientation:  11/12 Attention: 6/8 Comprehension: 5/6 Repetition: 11/12 Naming: 4/8, Mild impairment.  Further assessed without visual component Construction: not assessed Memory: 9/12 Calculations: 4/4 Similarities: 6/8 Judgment: 6/6    SLP Assessment  SLP Recommendation/Assessment: All further Speech Lanaguage Pathology  needs can be addressed in the next venue of care SLP Visit Diagnosis: Cognitive communication deficit (R41.841)    Follow Up Recommendations  Inpatient Rehab    Frequency and Duration   N/A        SLP Evaluation Cognition  Overall Cognitive Status: Within Functional Limits for tasks assessed Arousal/Alertness: Awake/alert Orientation Level: Oriented X4 Attention: Focused Focused Attention: Appears intact Memory: Impaired Memory Impairment: Decreased  short term memory Decreased Short Term Memory: Verbal basic Awareness: Appears intact Problem Solving: Appears intact Executive Function: Reasoning;Self Correcting Reasoning: Appears intact Self Correcting: Appears intact       Comprehension  Auditory Comprehension Overall Auditory Comprehension: Appears within functional limits for tasks assessed Commands: Within Functional Limits Conversation: Simple    Expression Expression Primary Mode of Expression: Verbal Verbal Expression Overall Verbal Expression: Appears within functional limits for tasks assessed Initiation: No impairment Repetition: No impairment Naming: No impairment Pragmatics: No impairment   Oral / Motor  Motor Speech Overall Motor Speech: Appears within functional limits for tasks assessed Respiration: Within functional limits Phonation: Normal Articulation: Within functional limitis Intelligibility: Intelligible Motor Planning: Witnin functional limits Motor Speech Errors: Not applicable   [place], [place], [place] Office: [phone] 06/12/2020, 12:19 PM

## 2020-06-12 NOTE — Progress Notes (Signed)
Hypoglycemic Event  CBG: 46  Treatment: 8 oz juice/soda  Symptoms: Nervous/irritable  Follow-up CBG: [id number] CBG Result:165  Possible Reasons for Event: Inadequate meal intake  Comments/MD notified yes     Yvette Rack

## 2020-06-12 NOTE — Progress Notes (Signed)
Inpatient Rehabilitation Admissions Coordinator  I have insurance approval and Cir bed to admit patient to today. Patient and daughters in agreement. I have alerted Dr. Tawanna Solo, acute team and TOC. I will make the arrangements to admit today.  Danne Baxter, RN, MSN Rehab Admissions Coordinator [phone] 06/12/2020 11:20 AM

## 2020-06-13 LAB — COMPREHENSIVE METABOLIC PANEL
ALT: 9 U/L (ref 0–44)
AST: 14 U/L — ABNORMAL LOW (ref 15–41)
Albumin: 2.4 g/dL — ABNORMAL LOW (ref 3.5–5.0)
Alkaline Phosphatase: 46 U/L (ref 38–126)
Anion gap: 11 (ref 5–15)
BUN: 29 mg/dL — ABNORMAL HIGH (ref 6–20)
CO2: 21 mmol/L — ABNORMAL LOW (ref 22–32)
Calcium: 8.7 mg/dL — ABNORMAL LOW (ref 8.9–10.3)
Chloride: 105 mmol/L (ref 98–111)
Creatinine, Ser: 3.98 mg/dL — ABNORMAL HIGH (ref 0.44–1.00)
GFR, Estimated: 13 mL/min — ABNORMAL LOW (ref >60)
Glucose, Bld: 218 mg/dL — ABNORMAL HIGH (ref 70–99)
Potassium: 4.3 mmol/L (ref 3.5–5.1)
Sodium: 137 mmol/L (ref 135–145)
Total Bilirubin: 0.5 mg/dL (ref 0.3–1.2)
Total Protein: 5.5 g/dL — ABNORMAL LOW (ref 6.5–8.1)

## 2020-06-13 LAB — GLUCOSE, CAPILLARY
Glucose-Capillary: 37 mg/dL — CL (ref 70–99)
Glucose-Capillary: 45 mg/dL — ABNORMAL LOW (ref 70–99)
Glucose-Capillary: 72 mg/dL (ref 70–99)
Glucose-Capillary: 246 mg/dL — ABNORMAL HIGH (ref 70–99)
Glucose-Capillary: 133 mg/dL — ABNORMAL HIGH (ref 70–99)
Glucose-Capillary: 235 mg/dL — ABNORMAL HIGH (ref 70–99)

## 2020-06-13 LAB — CBC WITH DIFFERENTIAL/PLATELET
Abs Immature Granulocytes: 0.02 10*3/uL (ref 0.00–0.07)
Basophils Absolute: 0 10*3/uL (ref 0.0–0.1)
Basophils Relative: 0 %
Eosinophils Absolute: 0.2 10*3/uL (ref 0.0–0.5)
Eosinophils Relative: 3 %
HCT: 23.1 % — ABNORMAL LOW (ref 36.0–46.0)
Hemoglobin: 7.8 g/dL — ABNORMAL LOW (ref 12.0–15.0)
Immature Granulocytes: 0 %
Lymphocytes Relative: 26 %
Lymphs Abs: 1.7 10*3/uL (ref 0.7–4.0)
MCH: 29.1 pg (ref 26.0–34.0)
MCHC: 33.8 g/dL (ref 30.0–36.0)
MCV: 86.2 fL (ref 80.0–100.0)
Monocytes Absolute: 0.5 10*3/uL (ref 0.1–1.0)
Monocytes Relative: 7 %
Neutro Abs: 4.2 10*3/uL (ref 1.7–7.7)
Neutrophils Relative %: 64 %
Platelets: 201 10*3/uL (ref 150–400)
RBC: 2.68 MIL/uL — ABNORMAL LOW (ref 3.87–5.11)
RDW: 13 % (ref 11.5–15.5)
WBC: 6.7 10*3/uL (ref 4.0–10.5)
nRBC: 0 % (ref 0.0–0.2)

## 2020-06-13 MED ORDER — INSULIN GLARGINE 100 UNIT/ML ~~LOC~~ SOLN
25.0000 [IU] | Freq: Every day | SUBCUTANEOUS | Status: DC
  Administered 2020-06-13: 25 [IU] via SUBCUTANEOUS
  Filled 2020-06-13 (×2): qty 0.25

## 2020-06-13 MED ORDER — SORBITOL 70 % SOLN
15.0000 mL | Freq: Every day | Status: DC | PRN
  Administered 2020-06-13 – 2020-06-22 (×4): 15 mL via ORAL
  Filled 2020-06-13 (×5): qty 30

## 2020-06-13 MED ORDER — TRAZODONE HCL 50 MG PO TABS
50.0000 mg | ORAL_TABLET | Freq: Every evening | ORAL | Status: DC | PRN
  Administered 2020-06-13 – 2020-06-24 (×10): 50 mg via ORAL
  Filled 2020-06-13 (×10): qty 1

## 2020-06-13 MED ORDER — DEXTROSE 50 % IV SOLN
INTRAVENOUS | Status: AC
  Filled 2020-06-13: qty 50

## 2020-06-13 MED ORDER — ONDANSETRON HCL 4 MG PO TABS
4.0000 mg | ORAL_TABLET | Freq: Three times a day (TID) | ORAL | Status: DC | PRN
  Administered 2020-06-24: 4 mg via ORAL
  Filled 2020-06-13: qty 1

## 2020-06-13 MED ORDER — GLUCOSE 40 % PO GEL
ORAL | Status: AC
  Filled 2020-06-13: qty 1

## 2020-06-13 NOTE — Progress Notes (Signed)
At 0415am BS 37 Pt diaphoretic, lethargic stating, "I just feel weird." Pt able to tolerate drinking ensure and OJ. Recheck BS 45 after 15 minutes. Pt had another OJ with peanut butter crackers. Recheck after 15 minutes read 72. Pt stated, "I feel much better, thank you." Pt stable at this time. RN will notify provider and continue to monitor.

## 2020-06-13 NOTE — Progress Notes (Signed)
Inpatient Diabetes Program Recommendations  AACE/ADA: New Consensus Statement on Inpatient Glycemic Control (2015)  Target Ranges:  Prepandial:   less than 140 mg/dL      Peak postprandial:   less than 180 mg/dL (1-2 hours)      Critically ill patients:  140 - 180 mg/dL   Lab Results  Component Value Date   GLUCAP 246 (H) 06/13/2020   HGBA1C 8.9 (H) 06/10/2020    Review of Glycemic Control Results for Hannah [name], Hannah [name] (MRN [id number]) as of 06/13/2020 14:52  Ref. Range 06/12/2020 04:45 06/12/2020 05:42 06/12/2020 10:44 06/12/2020 11:42 06/12/2020 16:38 06/12/2020 21:34 06/13/2020 04:15 06/13/2020 04:29 06/13/2020 04:41 06/13/2020 11:34  Glucose-Capillary Latest Ref Range: 70 - 99 mg/dL 46 (L) 165 (H) 53 (L) 133 (H) 167 (H) 106 (H) 37 (LL) 45 (L) 72 246 (H)  Diabetes history: DM 2 Outpatient Diabetes medications: Glucotrol 10 mg daily, Tresiba 35 units q HS, Humalog 6 units tid with meals Current orders for Inpatient glycemic control: Novolog 0-15 units tid + HS scale Lantus 35 units q HS  Inpatient Diabetes Program Recommendations:   Note low fasting CBG- Please reduce Lantus to 20 units q HS.    Thanks  Adah Perl, RN, BC-ADM Inpatient Diabetes Coordinator Pager [phone] (8a-5p)

## 2020-06-13 NOTE — Progress Notes (Signed)
Inpatient Rehabilitation  Patient information reviewed and entered into eRehab system by Moncerrat Burnstein M. Elysse Polidore, M.A., CCC/SLP, PPS Coordinator.  Information including medical coding, functional ability and quality indicators will be reviewed and updated through discharge.    

## 2020-06-13 NOTE — Evaluation (Signed)
Physical Therapy Assessment and Plan  Patient Details  Name: Hannah [name] MRN: [id number] Date of Birth: 02-Feb-1966  PT Diagnosis: Abnormal posture, Abnormality of gait, Ataxia, Cognitive deficits, Coordination disorder, Difficulty walking, Hemiparesis dominant, Hypertonia, Impaired sensation and Muscle spasms Rehab Potential: Fair ELOS: ~2 weeks   Today's Date: 06/13/2020 PT Individual Time: 0800-0910 PT Individual Time Calculation (min): 70 min    Hospital Problem: Principal Problem:   Right pontine cerebrovascular accident [facility])   Past Medical History:  Past Medical History:  Diagnosis Date  . Cerebral infarction ([place]) 07/12/2017   2009 first one, has had a total of 4 stokes  . Diabetes type 2, controlled ([place])   . Hemiparesis affecting right side as late effect of cerebrovascular accident (CVA) ([name])   . Hyperlipidemia   . Hypertension   . Multiple sclerosis ([place]) 2009  . Stroke [facility])    Past Surgical History:  Past Surgical History:  Procedure Laterality Date  . APPENDECTOMY    . CYSTOSTOMY W/ BLADDER BIOPSY    . OVARIAN CYST SURGERY    . stent placed in heart      Assessment & Plan Clinical Impression: Patient is a 55 y.o. year old female with history of hyperlipidemia, CKD stage IV, multiple sclerosis, diabetes mellitus, hypertension, multiple CVA with right side residual weakness and slurred speech maintained on aspirin and Plavix. Patient reportedly has missed multiple medical follow-up appointments. Per chart review patient lives with her family. She has assistance as needed. Used a Rollator prior to admission. Family assist with self-care. Presented 06/09/2020 with dizziness and vision change as well as left-sided weakness. Blood pressure 208/102. Cranial CT scan negative for acute changes. MRI showed punctate focus of restricted diffusion within the dorsal pons on the right side. Scattered and confluent foci of T2 hyperintensity also seen within the white  matter of the cerebral hemispheres and pons, nonspecific consistent with clinical diagnosis of multiple sclerosis with multiple new lesions. There is question whether these lesions are truly MS however.MRA unremarkable. Admission chemistries glucose 161 BUN 39 creatinine 4.11 with noted creatinine from February 2021 2.81, alcohol negative. Patient did not receive TPA. Currently maintained on aspirin and Plavix for CVA prophylaxis. Subcutaneous Lovenox for DVT prophylaxis. Therapy evaluations completed with recommendations of physical medicine rehab consult due to left-sided weakness and facial droop with slurred speech. Patient was admitted for a comprehensive rehab program.   Patient currently requires mod with mobility secondary to muscle weakness, decreased cardiorespiratoy endurance, impaired timing and sequencing, abnormal tone, unbalanced muscle activation, ataxia and decreased coordination, decreased visual acuity, decreased visual perceptual skills and decreased visual motor skills, decreased attention to left and decreased motor planning, decreased problem solving, decreased memory and delayed processing and decreased sitting balance, decreased standing balance, decreased postural control, hemiplegia and decreased balance strategies.  Prior to hospitalization, patient was min with mobility and lived with Daughter in a [place] home.  Home access is 3Stairs to enter (recently started going thru back door where there's no steps).  Patient will benefit from skilled PT intervention to maximize safe functional mobility, minimize fall risk and decrease caregiver burden for planned discharge home with 24 hour assist.  Anticipate patient will benefit from follow up OP at discharge.  PT - End of Session Activity Tolerance: Tolerates 30+ min activity with multiple rests Endurance Deficit: Yes PT Assessment Rehab Potential (ACUTE/IP ONLY): Fair PT Barriers to Discharge: Other (comments) PT  Barriers to Discharge Comments: h/o MS and multiple CVAa PT Patient demonstrates impairments in the  following area(s): Balance;Endurance;Motor;Nutrition;Perception;Safety;Sensory;Skin Integrity PT Transfers Functional Problem(s): Bed Mobility;Bed to Chair;Car;Furniture PT Locomotion Functional Problem(s): Ambulation;Wheelchair Mobility;Stairs PT Plan PT Intensity: Minimum of 1-2 x/day ,45 to 90 minutes PT Frequency: 5 out of 7 days PT Duration Estimated Length of Stay: ~2 weeks PT Treatment/Interventions: Ambulation/gait training;Discharge planning;Functional mobility training;Psychosocial support;Therapeutic Activities;Visual/perceptual remediation/compensation;Balance/vestibular training;Disease management/prevention;Neuromuscular re-education;Skin care/wound management;Therapeutic Exercise;Wheelchair propulsion/positioning;Cognitive remediation/compensation;DME/adaptive equipment instruction;Pain management;Splinting/orthotics;UE/LE Strength taining/ROM;Community reintegration;Functional electrical stimulation;Patient/family education;Stair training;UE/LE Coordination activities PT Transfers Anticipated Outcome(s): CGA transfers PT Locomotion Anticipated Outcome(s): MinA gait PT Recommendation Follow Up Recommendations: Home health PT;Outpatient PT;24 hour supervision/assistance Patient destination: Home Equipment Details: patient owns RW and wc   PT Evaluation Precautions/Restrictions Precautions Precautions: Fall Precaution Comments: R spastic hemiparesis in UE/LE Restrictions Weight Bearing Restrictions: No Home Living/Prior Functioning Home Living Available Help at Discharge: Family;Available PRN/intermittently;Available 24 hours/day (1 dtr weorks 3-7pm, 2nd dtr avail 24/7) Type of Home: Apartment Home Access: Stairs to enter (recently started going thru back door where there's no steps) Entrance Stairs-Number of Steps: 3 Entrance Stairs-Rails: None Home Layout: One  level Bathroom Shower/Tub: Chiropodist: Standard Bathroom Accessibility: Yes  Lives With: Daughter Prior Function Level of Independence: Needs assistance with gait;Requires assistive device for independence;Needs assistance with tranfers;Needs assistance with ADLs Bath: Supervision/set-up Toileting: Supervision/set-up Dressing: Supervision/set-up  Able to Take Stairs?: Yes (needed assist) Driving: No Vocation: On disability Vision/Perception  Vision - Assessment Eye Alignment: Impaired (comment) Ocular Range of Motion: Impaired-to be further tested in functional context Tracking/Visual Pursuits: Impaired - to be further tested in functional context (difficulty in R eye traking laterally) Saccades: Additional eye shifts occurred during testing Convergence: Impaired - to be further tested in functional context Diplopia Assessment: Disappears with one eye closed;Objects split on top of one another Perception Perception: Impaired Inattention/Neglect: Does not attend to left visual field Praxis Praxis: Impaired Praxis Impairment Details: Motor planning  Cognition Overall Cognitive Status: Within Functional Limits for tasks assessed Arousal/Alertness: Awake/alert Orientation Level: Oriented X4 Focused Attention: Appears intact Memory: Impaired Awareness: Appears intact Problem Solving: Impaired Reasoning: Appears intact Self Correcting: Appears intact Safety/Judgment: Appears intact Sensation Sensation Light Touch: Appears Intact Hot/Cold: Not tested Proprioception: Impaired by gross assessment Stereognosis: Not tested Additional Comments: reports N/T in R LE/UE Coordination Gross Motor Movements are Fluid and Coordinated: No Fine Motor Movements are Fluid and Coordinated: No Finger Nose Finger Test: dysmetric R UE Heel Shin Test: limited AROM R LE Motor  Motor Motor: Hemiplegia;Abnormal tone;Abnormal postural alignment and control;Primitive reflexes  present Motor - Skilled Clinical Observations: Spastic R hemi UE/LE, + clonus R LE, increased truncal tone   Trunk/Postural Assessment  Cervical Assessment Cervical Assessment: Exceptions to [facility] (forward head) Thoracic Assessment Thoracic Assessment: Exceptions to [facility] (increased kyphosis) Lumbar Assessment Lumbar Assessment: Exceptions to [facility] (posterior pelvic tilt) Postural Control Postural Control: Deficits on evaluation Righting Reactions: delayed and inadequate Protective Responses: delayed and inadequate  Balance Balance Balance Assessed: Yes Standardized Balance Assessment Standardized Balance Assessment: Berg Balance Test Berg Balance Test Sit to Stand: Needs moderate or maximal assist to stand Standing Unsupported: Unable to stand 30 seconds unassisted Sitting with Back Unsupported but Feet Supported on Floor or Stool: Able to sit 2 minutes under supervision Stand to Sit: Needs assistance to sit Transfers: Needs one person to assist Standing Unsupported with Eyes Closed: Needs help to keep from falling Standing Ubsupported with Feet Together: Needs help to attain position and unable to hold for 15 seconds From Standing, Reach Forward with Outstretched Arm: Loses balance while trying/requires external support  From Standing Position, Pick up Object from Floor: Unable to try/needs assist to keep balance From Standing Position, Turn to Look Behind Over each Shoulder: Needs assist to keep from losing balance and falling Turn 360 Degrees: Needs assistance while turning Standing Unsupported, Alternately Place Feet on Step/Stool: Needs assistance to keep from falling or unable to try Standing Unsupported, One Foot in Front: Loses balance while stepping or standing Standing on One Leg: Unable to try or needs assist to prevent fall Total Score: 4 Static Sitting Balance Static Sitting - Level of Assistance: 5: Stand by assistance Dynamic Sitting Balance Dynamic Sitting - Level of  Assistance: 4: Min assist Static Standing Balance Static Standing - Level of Assistance: 3: Mod assist Dynamic Standing Balance Dynamic Standing - Level of Assistance: 2: Max assist Extremity Assessment      RLE Assessment RLE Assessment: Exceptions to WFL (R LE clonus) RLE Strength Right Hip Flexion: 2-/5 Right Hip ABduction: 2-/5 Right Hip ADduction: 2/5 Right Knee Flexion: 2-/5 Right Knee Extension: 1/5 Right Ankle Dorsiflexion: 1/5 Right Ankle Plantar Flexion: 1/5 RLE Tone RLE Tone: Hypertonic;Modified Ashworth Body Part - Modified Ashworth Scale: Hamstrings;Quadriceps Hamstrings - Modified Ashworth Scale for Grading Hypertonia RLE: More marked increase in muscle tone through most of the ROM, but affected part(s) easily moved QUADRICEPS - Modified Ashworth Scale for Grading Hypertonia RLE: More marked increase in muscle tone through most of the ROM, but affected part(s) easily moved LLE Assessment LLE Assessment: Exceptions to [facility] LLE Strength Left Hip Flexion: 3+/5 Left Hip ABduction: 4-/5 Left Hip ADduction: 4-/5 Left Knee Flexion: 3+/5 Left Knee Extension: 4-/5 Left Ankle Dorsiflexion: 4-/5 Left Ankle Plantar Flexion: 4-/5  Care Tool Care Tool Bed Mobility Roll left and right activity   Roll left and right assist level: Minimal Assistance - Patient > 75%    Sit to lying activity   Sit to lying assist level: Moderate Assistance - Patient 50 - 74%    Lying to sitting edge of bed activity   Lying to sitting edge of bed assist level: Moderate Assistance - Patient 50 - 74%     Care Tool Transfers Sit to stand transfer   Sit to stand assist level: Maximal Assistance - Patient 25 - 49%    Chair/bed transfer   Chair/bed transfer assist level: Moderate Assistance - Patient 50 - 74%     Toilet transfer   Assist Level: Moderate Assistance - Patient 50 - 74%    Car transfer Car transfer activity did not occur: Safety/medical concerns        Care Tool  Locomotion Ambulation   Assist level: Maximal Assistance - Patient 25 - 49% Assistive device: Hand held assist Max distance: 5  Walk 10 feet activity Walk 10 feet activity did not occur: Safety/medical concerns       Walk 50 feet with 2 turns activity Walk 50 feet with 2 turns activity did not occur: Safety/medical concerns      Walk 150 feet activity Walk 150 feet activity did not occur: Safety/medical concerns      Walk 10 feet on uneven surfaces activity Walk 10 feet on uneven surfaces activity did not occur: Safety/medical concerns      Stairs Stair activity did not occur: Safety/medical concerns        Walk up/down 1 step activity Walk up/down 1 step or curb (drop down) activity did not occur: Safety/medical concerns     Walk up/down 4 steps activity did not occuR: Safety/medical concerns  Walk up/down  4 steps activity      Walk up/down 12 steps activity Walk up/down 12 steps activity did not occur: Safety/medical concerns      Pick up small objects from floor Pick up small object from the floor (from standing position) activity did not occur: Safety/medical concerns      Wheelchair Will patient use wheelchair at discharge?: Yes Type of Wheelchair: Manual   Wheelchair assist level: Total Assistance - Patient < 25%    Wheel 50 feet with 2 turns activity   Assist Level: Total Assistance - Patient < 25%  Wheel 150 feet activity   Assist Level: Total Assistance - Patient < 25%    Refer to Care Plan for Long Term Goals  SHORT TERM GOAL WEEK 1 PT Short Term Goal 1 (Week 1): Patient will complete bed mobility with MinA consistently PT Short Term Goal 2 (Week 1): Patient will transfers bed<> wc with LRAD and MinA consistently PT Short Term Goal 3 (Week 1): Patient will ambulate >17f with LRAD and ModA, 2nd person for wc follow if needed  Recommendations for other services: None   Skilled Therapeutic Intervention Mobility Bed Mobility Bed Mobility: Rolling  Right;Rolling Left;Supine to Sit;Sitting - Scoot to [place] [employer] I[place] Bed Rolling Right: Minimal Assistance - Patient > 75% Rolling Left: Minimal Assistance - Patient > 75% Supine to Sit: Moderate Assistance - Patient 50-74% Sitting - Scoot to Edge of Bed: Minimal Assistance - Patient > 75% Transfers Transfers: Sit to Stand;Stand to SLockheed MartinTransfers Sit to Stand: Maximal Assistance - Patient 25-49% Stand to Sit: Moderate Assistance - Patient 50-74% Stand Pivot Transfers: Maximal Assistance - Patient 25 - 49% Stand Pivot Transfer Details: Manual facilitation for weight shifting;Verbal cues for gait pattern;Manual facilitation for placement;Verbal cues for precautions/safety;Verbal cues for sequencing Transfer (Assistive device): 1 person hand held assist Locomotion  Gait Ambulation: Yes Gait Assistance: Maximal Assistance - Patient 25-49% Gait Distance (Feet): 5 Feet Assistive device: 1 person hand held assist Gait Assistance Details: Verbal cues for gait pattern;Manual facilitation for weight shifting;Verbal cues for precautions/safety;Manual facilitation for placement;Verbal cues for technique;Verbal cues for sequencing Gait Gait: Yes Gait Pattern: Impaired Gait Pattern: Decreased step length - right;Decreased step length - left;Decreased hip/knee flexion - right;Decreased hip/knee flexion - left;Left circumduction;Wide base of support;Decreased trunk rotation;Abducted - left;Shuffle;Left foot flat;Right foot flat;Lateral trunk lean to left Gait velocity: dec Stairs / Additional Locomotion Stairs: No Wheelchair Mobility Wheelchair Mobility: Yes Wheelchair Assistance: Dependent - Patient 0% Wheelchair Parts Management: Needs assistance  Patient received sitting up in bed, agreeable to PT eval, dtr at bedside. She denies pain when asked. Patient with significantly increased tone in R UE/LE and non-fatiguing clonus in R LE impacting her functional mobility. Patient and daughter reporting  that prior to hospitalization, she was able to ambulate very short household distances with RW and assist, but didn't specify how much assist. Patient benefiting from skilled PT in order to improve functional mobility to limit caregiver burden and increase independence and safety at home.   Discharge Criteria: Patient will be discharged from PT if patient refuses treatment 3 consecutive times without medical reason, if treatment goals not met, if there is a change in medical status, if patient makes no progress towards goals or if patient is discharged from hospital.  The above assessment, treatment plan, treatment alternatives and goals were discussed and mutually agreed upon: by patient and by family  JDebbora Dus1/28/2022, 9:18 AM

## 2020-06-13 NOTE — Progress Notes (Signed)
[facility] Individual Statement of Services  Patient Name:  Katielynn Deloza  Date:  06/13/2020  Welcome to the [facility].  Our goal is to provide you with an individualized program based on your diagnosis and situation, designed to meet your specific needs.  With this comprehensive rehabilitation program, you will be expected to participate in at least 3 hours of rehabilitation therapies Monday-Friday, with modified therapy programming on the weekends.  Your rehabilitation program will include the following services:  Physical Therapy (PT), Occupational Therapy (OT), Speech Therapy (ST), 24 hour per day rehabilitation nursing, Therapeutic Recreaction (TR), Neuropsychology, Care Coordinator, Rehabilitation Medicine, Nutrition Services, Pharmacy Services and Other  Weekly team conferences will be held on Wednesdays to discuss your progress.  Your Inpatient Rehabilitation Care Coordinator will talk with you frequently to get your input and to update you on team discussions.  Team conferences with you and your family in attendance may also be held.  Expected length of stay: 2-3 Weeks   Overall anticipated outcome: Supervision to Min A  Depending on your progress and recovery, your program may change. Your Inpatient Rehabilitation Care Coordinator will coordinate services and will keep you informed of any changes. Your Inpatient Rehabilitation Care Coordinator's name and contact numbers are listed  below.  The following services may also be recommended but are not provided by the [facility]:    [place] will be made to provide these services after discharge if needed.  Arrangements include referral to agencies that provide these services.  Your insurance has been verified to be:  [facility] Medicare Your primary doctor is:  Yaakov Guthrie, MD  Pertinent  information will be shared with your doctor and your insurance company.  Inpatient Rehabilitation Care Coordinator:  [name], [place] or [phone]  Information discussed with and copy given to patient by: Dyanne Iha, 06/13/2020, 10:12 AM

## 2020-06-13 NOTE — Progress Notes (Signed)
Inpatient Rehabilitation Care Coordinator Assessment and Plan Patient Details  Name: Hannah [name] MRN: [id number] Date of Birth: 1966/03/18  Today's Date: 06/13/2020  Hospital Problems: Principal Problem:   Right pontine cerebrovascular accident [facility])  Past Medical History:  Past Medical History:  Diagnosis Date  . Cerebral infarction ([place]) 07/12/2017   2009 first one, has had a total of 4 stokes  . Diabetes type 2, controlled ([place])   . Hemiparesis affecting right side as late effect of cerebrovascular accident (CVA) ([place])   . Hyperlipidemia   . Hypertension   . Multiple sclerosis ([place]) 2009  . Stroke [facility])    Past Surgical History:  Past Surgical History:  Procedure Laterality Date  . APPENDECTOMY    . CYSTOSTOMY W/ BLADDER BIOPSY    . OVARIAN CYST SURGERY    . stent placed in heart     Social History:  reports that she has never smoked. She has never used smokeless tobacco. She reports previous alcohol use. She reports that she does not use drugs.  Family / Support Systems Children: Aldine Contes Anticipated Caregiver: Lorelee Market Ability/Limitations of Caregiver: none Caregiver Availability: 24/7  Social History Preferred language: English Religion: None Read: Yes Write: Yes   Abuse/Neglect Abuse/Neglect Assessment Can Be Completed: Yes Physical Abuse: Denies Verbal Abuse: Denies Sexual Abuse: Denies Exploitation of patient/patient's resources: Denies Self-Neglect: Denies  Emotional Status Pt's affect, behavior and adjustment status: no Recent Psychosocial Issues: no Psychiatric History: no Substance Abuse History: no  Patient / Family Perceptions, Expectations & Goals Pt/Family understanding of illness & functional limitations: yes Premorbid pt/family roles/activities: Supervision with RW and assistance with ADLS, Self Care and Regualr Task Pt/family expectations/goals: Supervision to [employer]:  None Premorbid Home Care/DME Agencies: Other (Comment) Armed forces training and education officer, [place], Conservation officer, nature (tennis [place])) Transportation available at discharge: Family able to transport  Discharge Planning Living Arrangements: Children Support Systems: Children (Lives with two daughters) Type of Residence: Private residence (1 Level Home, Entry Level) Insurance Resources: Multimedia programmer (specify) (U[employer] Medicare) Museum/gallery curator Resources: Family Training and development officer Screen Referred: No Living Expenses: Lives with family Money Management: Patient,Family Does the patient have any problems obtaining your medications?: No Home Management: Assistance from Daughters Care Coordinator Anticipated Follow Up Needs: HH/OP Expected length of stay: 2-3 Weeks  Clinical Impression SW met with patient and daughter in room. Introduced self, explained role and addressed any questions/concerns. SW will continue to follow up.   Dyanne Iha 06/13/2020, 12:40 PM

## 2020-06-13 NOTE — Progress Notes (Signed)
[place] PHYSICAL MEDICINE & REHABILITATION PROGRESS NOTE   Subjective/Complaints:  Low CBG last noc, discussed with Nsg to give hs snack  Pt has been mainly confined to her room due to COVID concerns PTA used walker PTA , had a mild residual weakness from prior CVA plus deficits from MS Pt sees neuro as OP but no Epic notes   ROS- neg CP, SOB, N/V/D Objective:   ECHOCARDIOGRAM COMPLETE  Result Date: 06/11/2020    ECHOCARDIOGRAM REPORT   Patient Name:   Hannah [name] Date of Exam: 06/11/2020 Medical Rec #:  [id number]    Height:       62.0 in Accession #:    [id number]   Weight:       147.0 lb Date of Birth:  08/10/1965   BSA:          1.677 m Patient Age:    55 years     BP:           154/81 mmHg Patient Gender: F            HR:           68 bpm. Exam Location:  Inpatient Procedure: 2D Echo, Cardiac Doppler and Color Doppler Indications:    Stroke 434.91 / I163.9  History:        Patient has no prior history of Echocardiogram examinations.                 Stroke; Risk Factors:Hypertension, Diabetes, Dyslipidemia and                 Non-Smoker.  Sonographer:    Vickie Epley RDCS Referring Phys: [place]  1. Left ventricular ejection fraction, by estimation, is 60 to 65%. The left ventricle has normal function. The left ventricle has no regional wall motion abnormalities. Left ventricular diastolic parameters are consistent with Grade II diastolic dysfunction (pseudonormalization). Elevated left ventricular end-diastolic pressure.  2. Right ventricular systolic function is normal. The right ventricular size is normal.  3. The pericardial effusion is posterior to the left ventricle.  4. The mitral valve is normal in structure. Trivial mitral valve regurgitation. No evidence of mitral stenosis.  5. The aortic valve is normal in structure. Aortic valve regurgitation is trivial. No aortic stenosis is present.  6. The inferior vena cava is normal in size with greater than 50%  respiratory variability, suggesting right atrial pressure of 3 mmHg. FINDINGS  Left Ventricle: Left ventricular ejection fraction, by estimation, is 60 to 65%. The left ventricle has normal function. The left ventricle has no regional wall motion abnormalities. The left ventricular internal cavity size was normal in size. There is  no left ventricular hypertrophy. Left ventricular diastolic parameters are consistent with Grade II diastolic dysfunction (pseudonormalization). Elevated left ventricular end-diastolic pressure. Right Ventricle: The right ventricular size is normal. No increase in right ventricular wall thickness. Right ventricular systolic function is normal. Left Atrium: Left atrial size was normal in size. Right Atrium: Right atrial size was normal in size. Pericardium: Trivial pericardial effusion is present. The pericardial effusion is posterior to the left ventricle. Mitral Valve: The mitral valve is normal in structure. Trivial mitral valve regurgitation. No evidence of mitral valve stenosis. Tricuspid Valve: The tricuspid valve is normal in structure. Tricuspid valve regurgitation is trivial. No evidence of tricuspid stenosis. Aortic Valve: The aortic valve is normal in structure. Aortic valve regurgitation is trivial. No aortic stenosis is present. Pulmonic Valve: The pulmonic valve was normal  in structure. Pulmonic valve regurgitation is not visualized. No evidence of pulmonic stenosis. Aorta: The aortic root is normal in size and structure. Venous: The inferior vena cava is normal in size with greater than 50% respiratory variability, suggesting right atrial pressure of 3 mmHg. IAS/Shunts: No atrial level shunt detected by color flow Doppler.  LEFT VENTRICLE PLAX 2D LVIDd:         3.50 cm     Diastology LVIDs:         2.70 cm     LV e' medial:    3.75 cm/s LV PW:         0.80 cm     LV E/e' medial:  19.1 LV IVS:        0.80 cm     LV e' lateral:   4.13 cm/s LVOT diam:     1.90 cm     LV E/e'  lateral: 17.3 LV SV:         51 LV SV Index:   31 LVOT Area:     2.84 cm  LV Volumes (MOD) LV vol d, MOD A2C: 77.4 ml LV vol d, MOD A4C: 86.3 ml LV vol s, MOD A2C: 31.0 ml LV vol s, MOD A4C: 38.0 ml LV SV MOD A2C:     46.4 ml LV SV MOD A4C:     86.3 ml LV SV MOD BP:      49.8 ml RIGHT VENTRICLE RV S prime:     10.10 cm/s TAPSE (M-mode): 1.8 cm LEFT ATRIUM           Index       RIGHT ATRIUM          Index LA diam:      2.90 cm 1.73 cm/m  RA Area:     7.56 cm LA Vol (A2C): 25.2 ml 15.02 ml/m RA Volume:   13.00 ml 7.75 ml/m LA Vol (A4C): 28.8 ml 17.17 ml/m  AORTIC VALVE LVOT Vmax:   84.90 cm/s LVOT Vmean:  60.200 cm/s LVOT VTI:    0.181 m  AORTA Ao Root diam: 3.00 cm MITRAL VALVE MV Area (PHT): 2.91 cm    SHUNTS MV Decel Time: 261 msec    Systemic VTI:  0.18 m MV E velocity: 71.60 cm/s  Systemic Diam: 1.90 cm MV A velocity: 94.30 cm/s MV E/A ratio:  0.76 Jenkins Rouge MD Electronically signed by Jenkins Rouge MD Signature Date/Time: 06/11/2020/10:19:14 AM    Final    Recent Labs    06/12/20 1605 06/13/20 0809  WBC 7.9 6.7  HGB 9.6* 7.8*  HCT 29.5* 23.1*  PLT 238 201   Recent Labs    06/12/20 0204 06/12/20 1605 06/13/20 0809  NA 140  --  137  K 3.6  --  4.3  CL 109  --  105  CO2 22  --  21*  GLUCOSE 63*  --  218*  BUN 31*  --  29*  CREATININE 3.67* 3.65* 3.98*  CALCIUM 8.7*  --  8.7*    Intake/Output Summary (Last 24 hours) at 06/13/2020 0911 Last data filed at 06/13/2020 0500 Gross per 24 hour  Intake --  Output 500 ml  Net -500 ml        Physical Exam: Vital Signs Blood pressure (!) 156/82, pulse 88, temperature 98.1 F (36.7 C), resp. rate 14, height '5\' 2"'$  (1.575 m), weight 65.4 kg, SpO2 100 %.   General: No acute distress Mood and affect are appropriate Heart: Regular  rate and rhythm no rubs murmurs or extra sounds Lungs: Clear to auscultation, breathing unlabored, no rales or wheezes Abdomen: Positive bowel sounds, soft nontender to palpation,  nondistended Extremities: No clubbing, cyanosis, or edema Skin: No evidence of breakdown, no evidence of rash Neurologic: Cranial nerves II through XII intact, motor strength is 5/5 in left 3- RIght  deltoid, bicep, tricep, grip, hip flexor, knee extensors, ankle dorsiflexor and plantar flexor Sensory exam normal sensation to light touch and proprioception in bilateral upper and lower extremities Cerebellar exam normal finger to nose to finger as well as heel to shin in bilateral upper and lower extremities Musculoskeletal: Full range of motion in all 4 extremities. No joint swelling   Assessment/Plan: 1. Functional deficits which require 3+ hours per day of interdisciplinary therapy in a comprehensive inpatient rehab setting.  Physiatrist is providing close team supervision and 24 hour management of active medical problems listed below.  Physiatrist and rehab team continue to assess barriers to discharge/monitor patient progress toward functional and medical goals  Care Tool:  Bathing              Bathing assist       Upper Body Dressing/Undressing Upper body dressing        Upper body assist      Lower Body Dressing/Undressing Lower body dressing            Lower body assist       Toileting Toileting    Toileting assist       Transfers Chair/bed transfer  Transfers assist           Locomotion Ambulation   Ambulation assist              Walk 10 feet activity   Assist           Walk 50 feet activity   Assist           Walk 150 feet activity   Assist           Walk 10 feet on uneven surface  activity   Assist           Wheelchair     Assist               Wheelchair 50 feet with 2 turns activity    Assist            Wheelchair 150 feet activity     Assist          Blood pressure (!) 156/82, pulse 88, temperature 98.1 F (36.7 C), resp. rate 14, height '5\' 2"'$  (1.575 m), weight  65.4 kg, SpO2 100 %.   Medical Problem List and Plan: 1.Visual changes and functional deficits secondary to acute nonhemorrhagic dorsal right pontine infarct as well as history of multiple CVA in the past with right-sided spastic weakness and slurred speech. ?MS lesions also -patient may shower -ELOS/Goals: 15-21 days, supervision to min assist with PT, OT, SLP 2. Antithrombotics: -DVT/anticoagulation:Subcutaneous heparin -antiplatelet therapy: Aspirin 81 mg daily and Plavix 75 mg daily 3. Pain Management:Tylenol as needed 4. Mood:Prozac 20 mg daily -antipsychotic agents: N/A 5. Neuropsych: This patientiscapable of making decisions on herown behalf. 6. Skin/Wound Care:Routine skin checks 7. Fluids/Electrolytes/Nutrition:Routine in and outs with follow-up chemistries 8. Hypertension. Norvasc 10 mg daily, Lopressor 100 mg twice daily. Monitor with increased mobility Vitals:   06/13/20 0414 06/13/20 0545  BP: (!) 173/80 (!) 156/82  Pulse: 88   Resp: 14   Temp: 98.1 F (  36.7 C)   SpO2: 100%    9. Diabetes mellitus. Hemoglobin A1c 8.9. Lantus insulin 35 units nightly. Check blood sugars before meals and at bedtime.   CBG (last 3)  Recent Labs    06/13/20 0415 06/13/20 0429 06/13/20 0441  GLUCAP 37* 45* 72  add hs snack , reduce lantus   10. CKD stage IV. Latest baseline creatinine from February 2021 was 2.81. Follow-up chemistries 11.? Multiple sclerosis. Self-reported. Multiple new lesions on MRI c/w with diagnosis but pt apparently never has been treated.  -pt to f/u with Dr. Leonie Man as outpt to further explore.  12. GERD. Pepcid 13. Hyperlipidemia. Crestor   14.  Anemia will guaic stools and recheck Hgb in am , check ortho vitals if symptomatic consider transfusion  LOS: 1 days A FACE TO FACE EVALUATION WAS PERFORMED  Charlett Blake 06/13/2020, 9:11 AM

## 2020-06-13 NOTE — IPOC Note (Signed)
Overall Plan of Care [facility]) Patient Details Name: Hannah [name] MRN: [id number] DOB: 16-Nov-1965  Admitting Diagnosis: Right pontine cerebrovascular accident [facility])  Hospital Problems: Principal Problem:   Right pontine cerebrovascular accident [facility])     Functional Problem List: Nursing Bladder,Endurance,Pain,Motor,Safety,Medication Management,Bowel  PT Balance,Endurance,Motor,Nutrition,Perception,Safety,Sensory,Skin Integrity  OT Balance,Endurance,Motor,Vision,Cognition,Safety  SLP Cognition  TR         Basic ADL's: OT Eating,Grooming,Bathing,Dressing,Toileting     Advanced  ADL's: OT       Transfers: PT Bed Mobility,Bed to Chair,Car,Furniture  OT Toilet,Tub/Shower     Locomotion: PT Ambulation,Wheelchair Mobility,Stairs     Additional Impairments: OT Fuctional Use of Upper Extremity  SLP Social Cognition   Memory,Attention,Problem Solving  TR      Anticipated Outcomes Item Anticipated Outcome  Self Feeding modified independent  Swallowing      Basic self-care  min assist  Toileting  min assist   Bathroom Transfers min assist  Bowel/Bladder  manage bowel and bladder with supervision  Transfers  CGA transfers  Locomotion  MinA gait  Communication     Cognition  Mod I  Pain  pain level less than 4 on scale of 0-10  Safety/Judgment  Remain free of injury, prevent falls with cues and reminders.   Therapy Plan: PT Intensity: Minimum of 1-2 x/day ,45 to 90 minutes PT Frequency: 5 out of 7 days PT Duration Estimated Length of Stay: ~2 weeks OT Intensity: Minimum of 1-2 x/day, 45 to 90 minutes OT Frequency: 5 out of 7 days OT Duration/Estimated Length of Stay: 12-14 days SLP Intensity: Minumum of 1-2 x/day, 30 to 90 minutes SLP Frequency: 3 to 5 out of 7 days SLP Duration/Estimated Length of Stay: 14 days   Due to the current state of emergency, patients may not be receiving their 3-hours of Medicare-mandated therapy.   Team  Interventions: Nursing Interventions Pain Management,Patient/Family Education,Bladder Management,Medication Management,Discharge Planning,Psychosocial Support,Disease Management/Prevention  PT interventions Ambulation/gait training,Discharge planning,Functional mobility training,Psychosocial support,Therapeutic Activities,Visual/perceptual remediation/compensation,Balance/vestibular training,Disease management/prevention,Neuromuscular re-education,Skin care/wound Heritage manager propulsion/positioning,Cognitive remediation/compensation,DME/adaptive equipment instruction,Pain management,Splinting/orthotics,UE/LE Strength taining/ROM,Community reintegration,Functional electrical stimulation,Patient/family education,Stair training,UE/LE Coordination activities  OT Interventions Balance/vestibular training,Cognitive remediation/compensation,Community reintegration,DME/adaptive equipment instruction,Disease mangement/prevention,Discharge planning,Functional electrical stimulation,Pain management,Self Care/advanced ADL retraining,Therapeutic Activities,UE/LE Coordination activities,Visual/perceptual remediation/compensation,Therapeutic Exercise,Patient/family education,Functional mobility training,Neuromuscular re-education,Psychosocial support,Splinting/orthotics,UE/LE Strength taining/ROM,Wheelchair propulsion/positioning  SLP Interventions Cognitive remediation/compensation,Internal/external aids,Cueing hierarchy,Functional tasks,Patient/family education  TR Interventions    SW/CM Interventions Discharge Planning,Psychosocial Support,Patient/Family Education,Disease Management/Prevention   Barriers to Discharge MD  Medical stability  Nursing      PT Other (comments) h/o MS and multiple CVAa  OT      SLP      SW       Team Discharge Planning: Destination: PT-Home ,OT- Home , SLP-Home Projected Follow-up: PT-Home health PT,Outpatient PT,24 hour supervision/assistance, OT-   Outpatient OT, SLP-Home Health SLP Projected Equipment Needs: PT- , OT- To be determined, SLP-None recommended by SLP Equipment Details: PT-patient owns RW and wc, OT-  Patient/family involved in discharge planning: PT- Patient,Family member/caregiver,  OT-Patient,Family member/caregiver, SLP-Patient  MD ELOS: 15-21d Medical Rehab Prognosis:  Good Assessment:  55 year old right-handed female with history of hyperlipidemia, CKD stage IV, multiple sclerosis, diabetes mellitus, hypertension, multiple CVA with right side residual weakness and slurred speech maintained on aspirin and Plavix. Patient reportedly has missed multiple medical follow-up appointments. Per chart review patient lives with her family. She has assistance as needed. Used a Rollator prior to admission. Family assist with self-care. Presented 06/09/2020 with dizziness and vision change as well as left-sided weakness. Blood pressure 208/102. Cranial  CT scan negative for acute changes. MRI showed punctate focus of restricted diffusion within the dorsal pons on the right side. Scattered and confluent foci of T2 hyperintensity also seen within the white matter of the cerebral hemispheres and pons, nonspecific consistent with clinical diagnosis of multiple sclerosis with multiple new lesions. There is question whether these lesions are truly MS however.MRA unremarkable. Admission chemistries glucose 161 BUN 39 creatinine 4.11 with noted creatinine from February 2021 2.81, alcohol negative. Patient did not receive TPA. Currently maintained on aspirin and Plavix for CVA prophylaxis. Subcutaneous Lovenox for DVT prophylaxis. Therapy evaluations completed with recommendations of physical medicine rehab consult due to left-sided weakness and facial droop with slurred speech. Patient was admitted for a comprehensive rehab program.   See Team Conference Notes for weekly updates to the plan of care

## 2020-06-13 NOTE — Progress Notes (Signed)
Hypoglycemic Event  CBG: 37  Treatment: 4 oz juice/soda  Symptoms: Sweaty and Shaky  Follow-up CBG: [id number]*  CBG Result:45  Possible Reasons for Event: Inadequate meal intake  Comments/MD notified: Silvestre Mesi PA notified    Kathreen Cosier

## 2020-06-13 NOTE — Evaluation (Signed)
Occupational Therapy Assessment and Plan  Patient Details  Name: Hannah [name] MRN: [id number] Date of Birth: May 15, 1966  OT Diagnosis: abnormal posture, cognitive deficits, disturbance of vision, hemiplegia affecting non-dominant side and muscle weakness (generalized) Rehab Potential: Rehab Potential (ACUTE ONLY): Good ELOS: 12-14 days   Today's Date: 06/13/2020 OT Individual Time: 1100-1205 OT Individual Time Calculation (min): 65 min     Hospital Problem: Principal Problem:   Right pontine cerebrovascular accident [facility])   Past Medical History:  Past Medical History:  Diagnosis Date  . Cerebral infarction ([place]) 07/12/2017   2009 first one, has had a total of 4 stokes  . Diabetes type 2, controlled ([place])   . Hemiparesis affecting right side as late effect of cerebrovascular accident (CVA) ([place])   . Hyperlipidemia   . Hypertension   . Multiple sclerosis ([place]) 2009  . Stroke [facility])    Past Surgical History:  Past Surgical History:  Procedure Laterality Date  . APPENDECTOMY    . CYSTOSTOMY W/ BLADDER BIOPSY    . OVARIAN CYST SURGERY    . stent placed in heart      Assessment & Plan Clinical Impression: Patient is a 55 y.o. year old female with recent admission to the hospital on 06/09/2020 with dizziness and vision change as well as left-sided weakness. Blood pressure 208/102. Cranial CT scan negative for acute changes. MRI showed punctate focus of restricted diffusion within the dorsal pons on the right side. Scattered and confluent foci of T2 hyperintensity also seen within the white matter of the cerebral hemispheres and pons, nonspecific consistent with clinical diagnosis of multiple sclerosis with multiple new lesions. There is question whether these lesions are truly MS however.  Patient transferred to CIR on 06/12/2020 .    Patient currently requires max with basic self-care skills secondary to muscle weakness, muscle joint tightness and muscle paralysis, impaired  timing and sequencing, abnormal tone, unbalanced muscle activation and decreased coordination, decreased visual motor skills, decreased memory and decreased standing balance, decreased postural control, hemiplegia and decreased balance strategies.  Prior to hospitalization, patient could complete ADLs with min.  Patient will benefit from skilled intervention to decrease level of assist with basic self-care skills and increase independence with basic self-care skills prior to discharge home with care partner.  Anticipate patient will require minimal physical assistance and follow up outpatient.  OT - End of Session Activity Tolerance: Tolerates 30+ min activity with multiple rests Endurance Deficit: Yes OT Assessment Rehab Potential (ACUTE ONLY): Good OT Patient demonstrates impairments in the following area(s): Balance;Endurance;Motor;Vision;Cognition;Safety OT Basic ADL's Functional Problem(s): Eating;Grooming;Bathing;Dressing;Toileting OT Transfers Functional Problem(s): Toilet;Tub/Shower OT Additional Impairment(s): Fuctional Use of Upper Extremity OT Plan OT Intensity: Minimum of 1-2 x/day, 45 to 90 minutes OT Frequency: 5 out of 7 days OT Duration/Estimated Length of Stay: 12-14 days OT Treatment/Interventions: Balance/vestibular training;Cognitive remediation/compensation;Community reintegration;DME/adaptive equipment instruction;Disease mangement/prevention;Discharge planning;Functional electrical stimulation;Pain management;Self Care/advanced ADL retraining;Therapeutic Activities;UE/LE Coordination activities;Visual/perceptual remediation/compensation;Therapeutic Exercise;Patient/family education;Functional mobility training;Neuromuscular re-education;Psychosocial support;Splinting/orthotics;UE/LE Strength taining/ROM;Wheelchair propulsion/positioning OT Self Feeding Anticipated Outcome(s): modified independent OT Basic Self-Care Anticipated Outcome(s): min assist OT Toileting Anticipated  Outcome(s): min assist OT Bathroom Transfers Anticipated Outcome(s): min assist OT Recommendation Recommendations for Other Services: Therapeutic Recreation consult Therapeutic Recreation Interventions: Stress management Patient destination: Home Follow Up Recommendations: Outpatient OT Equipment Recommended: To be determined   OT Evaluation Precautions/Restrictions  Precautions Precautions: Fall Precaution Comments: R spastic hemiparesis in UE/LE Restrictions Weight Bearing Restrictions: No  Pain Pain Assessment Pain Scale: 0-10 Pain Score: 0-No pain Home Living/Prior Functioning  Home Living Family/patient expects to be discharged to:: Inpatient rehab Living Arrangements: Children Available Help at Discharge: Family,Available PRN/intermittently,Available 24 hours/day Type of Home: Apartment Home Access: Stairs to enter Entrance Stairs-Number of Steps: 3 Entrance Stairs-Rails: None Home Layout: One level Bathroom Shower/Tub: Optometrist: Yes  Lives With: Daughter IADL History Homemaking Responsibilities: No Current License: No Education: some college Prior Function Level of Independence: Needs assistance with ADLs Bath: Supervision/set-up Toileting: Supervision/set-up Dressing: Minimal  Able to Take Stairs?: Yes (needed assist) Driving: No Vocation: On disability Vision Baseline Vision/History: Wears glasses Wears Glasses: Reading only Patient Visual Report: Diplopia;Blurring of vision;Other (comment) Vision Assessment?: Yes Eye Alignment: Impaired (comment) (left eye exotropia) Ocular Range of Motion: Within Functional Limits (Pt able to exhibit full AROM with gross testing) Alignment/Gaze Preference: Within Defined Limits Tracking/Visual Pursuits: Other (comment) (Pt with slight deviation with the left eye during tracking resulting in diplopia.  Will eval further in treatment.) Convergence: Impaired  (comment) (left eye does not follow target medially) Visual Fields: Impaired-to be further tested in functional context Diplopia Assessment: Disappears with one eye closed;Objects split on top of one another Perception  Perception: Impaired Inattention/Neglect: Does not attend to left visual field Praxis Praxis: Impaired Praxis Impairment Details: Motor planning Cognition Overall Cognitive Status: Impaired/Different from baseline Arousal/Alertness: Awake/alert Orientation Level: Person;Place;Situation Person: Oriented Place: Oriented Situation: Oriented Year: 2022 Month: January Day of Week: Correct Memory: Impaired Memory Impairment: Decreased recall of new information Decreased Short Term Memory: Verbal basic Immediate Memory Recall: Sock;Blue;Bed Memory Recall Sock: Not able to recall Memory Recall Blue: Without Cue Memory Recall Bed: With Cue Attention: Selective Focused Attention: Appears intact Selective Attention: Impaired Selective Attention Impairment: Verbal complex;Functional complex Awareness: Appears intact Problem Solving: Impaired Problem Solving Impairment: Functional complex Safety/Judgment: Appears intact Sensation Sensation Light Touch: Appears Intact Proprioception: Appears Intact Stereognosis: Not tested Additional Comments: Sensation in BUEs intact with testing however pt reports some numbness and tingling in the digit of the right hand at times. Coordination Gross Motor Movements are Fluid and Coordinated: No Fine Motor Movements are Fluid and Coordinated: No Coordination and Movement Description: Pt with synergy pattern in the right shoulder with attempting use as well as increased tone.  She can use functionally at a gross assist level with selfcare tasks, but needs mod assist to integrate at an active assist level. Motor  Motor Motor: Hemiplegia;Abnormal tone;Abnormal postural alignment and control;Primitive reflexes present Motor - Skilled  Clinical Observations: Spastic R hemi UE/LE, + clonus R LE, increased truncal tone  Trunk/Postural Assessment  Cervical Assessment Cervical Assessment: Exceptions to [facility] (forward head) Thoracic Assessment Thoracic Assessment:  (thoracic kyphosis) Lumbar Assessment Lumbar Assessment: Exceptions to [facility] (posterior pelvic tilt)  Balance Balance Balance Assessed: Yes Static Sitting Balance Static Sitting - Level of Assistance: 5: Stand by assistance Dynamic Sitting Balance Dynamic Sitting - Balance Support: During functional activity Dynamic Sitting - Level of Assistance: 4: Min assist Static Standing Balance Static Standing - Balance Support: During functional activity Static Standing - Level of Assistance: 3: Mod assist Dynamic Standing Balance Dynamic Standing - Balance Support: During functional activity Dynamic Standing - Level of Assistance: 2: Max assist Extremity/Trunk Assessment RUE Assessment RUE Assessment: Exceptions to [facility] Passive Range of Motion (PROM) Comments: shoulder flexion 0-120 degrees with end range pain.  Elbow flexion/extension WFLs, digits WFLS Active Range of Motion (AROM) Comments: Brunnstrum stage IV in the arm with stage V in the hand. General Strength Comments: Pt with gross grasp and release  present, but only able to exhibit approximately 80% of full extension with wrist in slight extension or neutral position.  She is able to oppose her thumb to digits 2 and 3 but not any of the other ones.  Noted slight cogwheeling movement with attempted shoulder and elbow AROM as well. LUE Assessment LUE Assessment: Within Functional Limits  Care Tool Care Tool Self Care Eating   Eating Assist Level: Set up assist    Oral Care    Oral Care Assist Level: Set up assist    Bathing   Body parts bathed by patient: Right arm;Chest;Abdomen;Right upper leg;Left upper leg;Face Body parts bathed by helper: Front perineal area;Buttocks;Left arm;Right lower leg;Left lower  leg   Assist Level: Moderate Assistance - Patient 50 - 74%    Upper Body Dressing(including orthotics)   What is the patient wearing?: Pull over shirt;Bra   Assist Level: Maximal Assistance - Patient 25 - 49%    Lower Body Dressing (excluding footwear)   What is the patient wearing?: Incontinence brief;Pants Assist for lower body dressing: Moderate Assistance - Patient 50 - 74%    Putting on/Taking off footwear   What is the patient wearing?: Shoes;Ted hose Assist for footwear: Maximal Assistance - Patient 25 - 49%       Care Tool Toileting Toileting activity   Assist for toileting: Moderate Assistance - Patient 50 - 74%     Care Tool Bed Mobility Roll left and right activity        Sit to lying activity        Lying to sitting edge of bed activity         Care Tool Transfers Sit to stand transfer   Sit to stand assist level: Moderate Assistance - Patient 50 - 74%    Chair/bed transfer   Chair/bed transfer assist level: Moderate Assistance - Patient 50 - 74%     Toilet transfer   Assist Level: Moderate Assistance - Patient 50 - 74%     Care Tool Cognition Expression of Ideas and Wants Expression of Ideas and Wants: Some difficulty - exhibits some difficulty with expressing needs and ideas (e.g, some words or finishing thoughts) or speech is not clear   Understanding Verbal and Non-Verbal Content Understanding Verbal and Non-Verbal Content: Understands (complex and basic) - clear comprehension without cues or repetitions   Memory/Recall Ability *first 3 days only Memory/Recall Ability *first 3 days only: Current season;That he or she is in a hospital/hospital unit;Location of own room    Refer to Care Plan for Long Term Goals  SHORT TERM GOAL WEEK 1 OT Short Term Goal 1 (Week 1): Pt will completed UB dressing with min assist for pullover shirt only. OT Short Term Goal 2 (Week 1): Pt will donn her underpants and pants with min assist sit to stand. OT Short Term  Goal 3 (Week 1): Pt will complete toilet transfer stand pivot with min assist. OT Short Term Goal 4 (Week 1): Pt will complete shower/tub transfer with min assist to the tub bench. OT Short Term Goal 5 (Week 1): Pt will complete visual tracking exercises with supervision following handout to help increase visual coordination and decrease diplopia.  Recommendations for other services: Therapeutic Recreation  Stress management   Skilled Therapeutic Intervention ADL ADL Eating: Set up Where Assessed-Eating: Wheelchair Grooming: Supervision/safety Where Assessed-Grooming: Wheelchair Upper Body Bathing: Minimal assistance Where Assessed-Upper Body Bathing: Wheelchair Lower Body Bathing: Moderate assistance Where Assessed-Lower Body Bathing: Wheelchair Upper Body Dressing: Maximal assistance  Where Assessed-Upper Body Dressing: Wheelchair Lower Body Dressing: Maximal assistance Where Assessed-Lower Body Dressing: Wheelchair Toileting: Moderate assistance Where Assessed-Toileting: Bedside Commode Toilet Transfer: Moderate assistance Toilet Transfer Method: Stand pivot Toilet Transfer Equipment: Bedside commode Mobility  Transfers Sit to Stand: Moderate Assistance - Patient 50-74% Stand to Sit: Moderate Assistance - Patient 50-74%   Session Note:  Pt worked on dressing sitting in the wheelchair to start session as her daughter had already started assisting her.  She reports having to have assist with her sports bra as well as assist for pulling her pullover shirt down in the back.  With therapist she was able to thread her brief and pants but needed mod assist to stand and pull them up over her hips.  This was done primarily with isolated use of the LUE only.  She was not able to position and maintain the RUE on the arm rest to assist with sit to stand.  Increased lean to the left noted with standing with increase extensor tone in the RLE during transition to standing.  She needed total assist  for TEDs with mod assist for donning her slip on shoes.  She reports that she is weaker on the right side from this stroke compared to her baseline, but she did require overall min assist for completion of transfers to different surfaces.  Pt's daughter present and able to confirm PLOF as well and will be able to provide 24 hour supervision along with her sister.  Issued patch for pt to alternate while watching TV and participating in therapy as she exhibits distance diplopia in all fields.  With near vision there are areas of her vision on the left of midline where fusion occurs but it is still inconsistent.  Instructed her to alternate patch ever 2 hours.  Pt and daughter voice understanding.     Discharge Criteria: Patient will be discharged from OT if patient refuses treatment 3 consecutive times without medical reason, if treatment goals not met, if there is a change in medical status, if patient makes no progress towards goals or if patient is discharged from hospital.  The above assessment, treatment plan, treatment alternatives and goals were discussed and mutually agreed upon: by patient and by family  Shahara Hartsfield OTR/L 06/13/2020, 4:29 PM

## 2020-06-13 NOTE — Evaluation (Signed)
Speech Language Pathology Assessment and Plan  Patient Details  Name: Hannah [name] MRN: [id number] Date of Birth: 1965/11/25  SLP Diagnosis: Cognitive Impairments;Dysarthria  Rehab Potential: Good ELOS: 14 days    Today's Date: 06/13/2020 SLP Individual Time: 1405-1500 SLP Individual Time Calculation (min): 55 min   Hospital Problem: Principal Problem:   Right pontine cerebrovascular accident [facility])  Past Medical History:  Past Medical History:  Diagnosis Date  . Cerebral infarction ([place]) 07/12/2017   2009 first one, has had a total of 4 stokes  . Diabetes type 2, controlled ([place])   . Hemiparesis affecting right side as late effect of cerebrovascular accident (CVA) ([place])   . Hyperlipidemia   . Hypertension   . Multiple sclerosis ([place]) 2009  . Stroke [facility])    Past Surgical History:  Past Surgical History:  Procedure Laterality Date  . APPENDECTOMY    . CYSTOSTOMY W/ BLADDER BIOPSY    . OVARIAN CYST SURGERY    . stent placed in heart      Assessment / Plan / Recommendation Clinical Impression   Hannah [name] is a 55 year old right-handed female with history of hyperlipidemia, CKD stage IV, multiple sclerosis, diabetes mellitus, hypertension, multiple CVA with right side residual weakness and slurred speech maintained on aspirin and Plavix. Patient reportedly has missed multiple medical follow-up appointments. Per chart review patient lives with her family. She has assistance as needed. Used a Rollator prior to admission. Family assist with self-care. Presented 06/09/2020 with dizziness and vision change as well as left-sided weakness. Blood pressure 208/102. Cranial CT scan negative for acute changes. MRI showed punctate focus of restricted diffusion within the dorsal pons on the right side. Scattered and confluent foci of T2 hyperintensity also seen within the white matter of the cerebral hemispheres and pons, nonspecific consistent with clinical diagnosis of multiple  sclerosis with multiple new lesions. There is question whether these lesions are truly MS however.MRA unremarkable. Admission chemistries glucose 161 BUN 39 creatinine 4.11 with noted creatinine from February 2021 2.81, alcohol negative. Patient did not receive TPA. Currently maintained on aspirin and Plavix for CVA prophylaxis. Subcutaneous Lovenox for DVT prophylaxis. Therapy evaluations completed with recommendations of physical medicine rehab consult due to left-sided weakness and facial droop with slurred speech. Patient was admitted for a comprehensive rehab program.  SLP evaluation completed on 06/13/2020 with results as follows:  Pt presents with mild cognitive deficits characterized by decreased selective attention to tasks, decreased recall of new information, and decreased functional problem solving.  Pt reports these deficits are new post acute CVA.  Pt also presents with a mild dysarthria resulting from right sided oral motor weakness.  She is fully intelligible and she states that her dysarthria is a result of weakness from one of her previous CVAs, not acute CVA.   A bedside swallow evaluation was also administered today given pt's reports of difficulty swallowing.  Pt exhibited no overt s/s of aspiration with solids or liquids.  She had slightly prolonged mastication of solids due to the abovementioned oral motor weakness and she reported that she bites her cheek on the right side frequently during meals which appears to be related to sensory deficits as pt reported numbness on her right side but she is able to clear solids from the oral cavity without leaving residue post swallow.  I recommend that pt continue on her currently prescribed diet.  No further ST needs indicated for dysphagia.   Given acute cognitive deficits, pt would benefit from skilled ST while inpatient  in order to maximize functional independence and reduce burden of care prior to discharge.  Anticipate that pt may need  HH ST and at least intermittent supervision at discharge.     Skilled Therapeutic Interventions          Cognitive-linguistic and bedside swallow evaluation completed with results and recommendations reviewed with patient.     SLP Assessment  Patient will need skilled [place] [facility] during CIR admission    Recommendations  Patient destination: Home Follow up Recommendations: Home Health SLP Equipment Recommended: None recommended by SLP    SLP Frequency 3 to 5 out of 7 days   SLP Duration  SLP Intensity  SLP Treatment/Interventions 14 days  Minumum of 1-2 x/day, 30 to 90 minutes  Cognitive remediation/compensation;Internal/external aids;Cueing hierarchy;Functional tasks;Patient/family education    Pain Pain Assessment Pain Scale: 0-10 Pain Score: 0-No pain  Prior Functioning Cognitive/Linguistic Baseline: Baseline deficits Baseline deficit details: hx of 4 strokes Type of Home: Apartment  Lives With: Daughter Available Help at Discharge: Family;Available PRN/intermittently;Available 24 hours/day Education: some college Vocation: On disability  SLP Evaluation Cognition Overall Cognitive Status: Impaired/Different from baseline Arousal/Alertness: Awake/alert Orientation Level: Oriented X4 Attention: Selective Selective Attention: Impaired Selective Attention Impairment: Functional complex;Verbal complex Memory: Impaired Memory Impairment: Decreased recall of new information Awareness: Appears intact Problem Solving: Impaired Problem Solving Impairment: Functional complex Safety/Judgment: Appears intact  Comprehension Auditory Comprehension Overall Auditory Comprehension: Appears within functional limits for tasks assessed Expression Expression Primary Mode of Expression: Verbal Verbal Expression Overall Verbal Expression: Appears within functional limits for tasks assessed Oral Motor Oral Motor/Sensory Function Overall Oral  Motor/Sensory Function: Mild impairment Facial ROM: Reduced right Facial Symmetry: Within Functional Limits Facial Strength: Reduced right Facial Sensation: Reduced right Lingual Strength: Reduced Motor Speech Overall Motor Speech: Impaired at baseline Respiration: Within functional limits Phonation: Normal Articulation: Impaired Level of Impairment: Conversation Intelligibility: Intelligible  Care Tool Care Tool Cognition Expression of Ideas and Wants Expression of Ideas and Wants: Some difficulty - exhibits some difficulty with expressing needs and ideas (e.g, some words or finishing thoughts) or speech is not clear   Understanding Verbal and Non-Verbal Content Understanding Verbal and Non-Verbal Content: Understands (complex and basic) - clear comprehension without cues or repetitions   Memory/Recall Ability *first 3 days only Memory/Recall Ability *first 3 days only: Current season;That he or she is in a hospital/hospital unit;Location of own room     Bedside Swallowing Assessment General Previous Swallow Assessment: none on record Diet Prior to this Study: Regular;Thin liquids Temperature Spikes Noted: No Respiratory Status: Room air History of Recent Intubation: No Behavior/Cognition: Alert;Cooperative;Pleasant mood Oral Cavity - Dentition: Adequate natural dentition Self-Feeding Abilities: Able to feed self Patient Positioning: Upright in chair/Tumbleform Baseline Vocal Quality: Normal Volitional Cough: Strong  Oral Care Assessment   Ice Chips   Thin Liquid Thin Liquid: Within functional limits Nectar Thick   Honey Thick   Puree Puree: Within functional limits Solid Solid: Impaired Oral Phase Impairments: Impaired mastication Oral Phase Functional Implications: Prolonged oral transit BSE Assessment Risk for Aspiration Impact on safety and function: Mild aspiration risk Other Related Risk Factors: Previous CVA;Cognitive impairment;Deconditioning  Short  Term Goals: Week 1: SLP Short Term Goal 1 (Week 1): Pt will utilize memory aids to facilitate recall of daily information with supervision verbal cues. SLP Short Term Goal 2 (Week 1): Pt will selectively attend to tasks in a mildly distracting environment with supervision verbal cues for redirection to task. SLP Short Term Goal 3 (Week 1):  Pt will complete mildly complex tasks with supervision verbal cues for functional problem solving.  Refer to Care Plan for Long Term Goals  Recommendations for other services: Neuropsych  Discharge Criteria: Patient will be discharged from SLP if patient refuses treatment 3 consecutive times without medical reason, if treatment goals not met, if there is a change in medical status, if patient makes no progress towards goals or if patient is discharged from hospital.  The above assessment, treatment plan, treatment alternatives and goals were discussed and mutually agreed upon: by patient  Emilio Math 06/13/2020, 4:03 PM

## 2020-06-13 NOTE — Progress Notes (Signed)
Hypoglycemic Event  CBG: 45  Treatment: 4 oz juice/soda  Symptoms: Sweaty and Shaky  Follow-up CBG: [id number] CBG Result:72  Possible Reasons for Event: Inadequate meal intake  Comments/MD notified:Notify Silvestre Mesi, PA    Kathreen Cosier

## 2020-06-14 LAB — CBC
HCT: 22.6 % — ABNORMAL LOW (ref 36.0–46.0)
Hemoglobin: 7.7 g/dL — ABNORMAL LOW (ref 12.0–15.0)
MCH: 29.2 pg (ref 26.0–34.0)
MCHC: 34.1 g/dL (ref 30.0–36.0)
MCV: 85.6 fL (ref 80.0–100.0)
Platelets: 199 10*3/uL (ref 150–400)
RBC: 2.64 MIL/uL — ABNORMAL LOW (ref 3.87–5.11)
RDW: 13 % (ref 11.5–15.5)
WBC: 7 10*3/uL (ref 4.0–10.5)
nRBC: 0 % (ref 0.0–0.2)

## 2020-06-14 LAB — GLUCOSE, CAPILLARY
Glucose-Capillary: 87 mg/dL (ref 70–99)
Glucose-Capillary: 64 mg/dL — ABNORMAL LOW (ref 70–99)
Glucose-Capillary: 72 mg/dL (ref 70–99)
Glucose-Capillary: 164 mg/dL — ABNORMAL HIGH (ref 70–99)
Glucose-Capillary: 173 mg/dL — ABNORMAL HIGH (ref 70–99)

## 2020-06-14 MED ORDER — INSULIN GLARGINE 100 UNIT/ML ~~LOC~~ SOLN
20.0000 [IU] | Freq: Every day | SUBCUTANEOUS | Status: DC
  Administered 2020-06-14 – 2020-06-15 (×2): 20 [IU] via SUBCUTANEOUS
  Filled 2020-06-14 (×3): qty 0.2

## 2020-06-14 NOTE — Progress Notes (Signed)
Occupational Therapy Session Note  Patient Details  Name: Hannah [name] MRN: [id number] Date of Birth: May 09, 1966  Today's Date: 06/14/2020 OT Individual Time: 0803-0901 OT Individual Time Calculation (min): 58 min    Short Term Goals: Week 1:  OT Short Term Goal 1 (Week 1): Pt will completed UB dressing with min assist for pullover shirt only. OT Short Term Goal 2 (Week 1): Pt will donn her underpants and pants with min assist sit to stand. OT Short Term Goal 3 (Week 1): Pt will complete toilet transfer stand pivot with min assist. OT Short Term Goal 4 (Week 1): Pt will complete shower/tub transfer with min assist to the tub bench. OT Short Term Goal 5 (Week 1): Pt will complete visual tracking exercises with supervision following handout to help increase visual coordination and decrease diplopia.  Skilled Therapeutic Interventions/Progress Updates:    Pt seen for OT treatment, agreeable to trying shower this am.  She was able to transfer from supine to sit EOB with mod assist.  Therapist then completed donning gripper socks for ambulation to the shower bench.  She needed mod assist to complete functional mobility without assistive device.  Decreased ability to advance the RLE secondary to weakness and increased tone.  Once in the shower she completed undressing with mod assist and then completed bathing with overall min assist.  Therapist provided mod facilitation in the RUE for washing under the left arm and shoulder.  She then needed mod assist with crossing the RLE over the left knee and maintaining for bathing as well.  Min assist for sit to stand throughout bathing tasks as well as for dressing tasks.  She worked on dressing sit to stand at the sink.  Mod assist for donning sports bra and pullover shirt.  Mod assist was also needed for donning brief and pants.  Therapist provided total assist for TEDs along with mod assist for slip on shoes.  Finished session with pt up in the wheelchair and  with the call button and phone in reach.  Safety belt in place as well.  Pt with report and demonstration of less diplopia with near vision tasks this am as well.    Therapy Documentation Precautions:  Precautions Precautions: Fall Precaution Comments: R spastic hemiparesis in UE/LE Restrictions Weight Bearing Restrictions: No  Pain: Pain Assessment Pain Scale: Faces Pain Score: 0-No pain ADL: See Care Tool Section for some details of mobility and selfcare  Therapy/Group: Individual Therapy  Jerin Franzel OTR/L 06/14/2020, 12:15 PM

## 2020-06-14 NOTE — Progress Notes (Signed)
Physical Therapy Session Note  Patient Details  Name: Hannah [name] MRN: [id number] Date of Birth: 01-19-1966  Today's Date: 06/14/2020 PT Individual Time: 0950-1053 PT Individual Time Calculation (min): 63 min   Short Term Goals: Week 1:  PT Short Term Goal 1 (Week 1): Patient will complete bed mobility with MinA consistently PT Short Term Goal 2 (Week 1): Patient will transfers bed<> wc with LRAD and MinA consistently PT Short Term Goal 3 (Week 1): Patient will ambulate >32f with LRAD and ModA, 2nd person for wc follow if needed  Skilled Therapeutic Interventions/Progress Updates:   Patient received sitting up in wc, agreeable to PT. She denies pain. PT propelling patient in wc to therapy gym for time management and energy conservation. Static standing balance with B UE support on RW, mirror for visual feedback on posture and MinA. Patient able to come into standing with ModA. Once standing, no R heel contact noted and limited ability of patient to shift weight posteriorly to achieve heel contact. Patient with moderate anterior weight shift. Dynamic standing balance with B LE marching, B UE support + MinA. Noted limited AROM to R LE with tendency for leg to try to move into extensor tone pattern. Patient ambulating 158fx2 with seated rest break between. RW + ModA. Patient trialing AFO to R LE for 1st 1539fith noted improvement in gait pattern and minimizing tone pattern. 2nd [id number] AFO and significantly increased difficulty for patient to advance R LE due to increasing tone. WC mobility [id number] B UE with Max verbal cues to engage RUE to improve efficiency of propulsion with little improvement. PT lowered wc to allow patient to reach floor with B LE to improve efficiency of wc mobility. Patient completing wc mobility using B LE forcing R LE to move outside of extensor tone pattern. Patient returning to room in wc, chair alarm on, call light within reach.    Therapy  Documentation Precautions:  Precautions Precautions: Fall Precaution Comments: R spastic hemiparesis in UE/LE Restrictions Weight Bearing Restrictions: No    Therapy/Group: Individual Therapy  JenniKaroline Caldwell DPT, CBIS  06/14/2020, 7:38 AM

## 2020-06-14 NOTE — Progress Notes (Signed)
[facility] PHYSICAL MEDICINE & REHABILITATION PROGRESS NOTE   Subjective/Complaints:  Patient is having a busy day with therapy.  No complaints overnight.  Appetite is good.  ROS- neg CP, SOB, N/V/D Objective:   No results found. Recent Labs    06/13/20 0809 06/14/20 0553  WBC 6.7 7.0  HGB 7.8* 7.7*  HCT 23.1* 22.6*  PLT 201 199   Recent Labs    06/12/20 0204 06/12/20 1605 06/13/20 0809  NA 140  --  137  K 3.6  --  4.3  CL 109  --  105  CO2 22  --  21*  GLUCOSE 63*  --  218*  BUN 31*  --  29*  CREATININE 3.67* 3.65* 3.98*  CALCIUM 8.7*  --  8.7*    Intake/Output Summary (Last 24 hours) at 06/14/2020 1339 Last data filed at 06/14/2020 0900 Gross per 24 hour  Intake 440 ml  Output -  Net 440 ml        Physical Exam: Vital Signs Blood pressure (!) 160/76, pulse 88, temperature 99 F (37.2 C), resp. rate 17, height '5\' 2"'$  (1.575 m), weight 65.4 kg, SpO2 100 %.    General: No acute distress Mood and affect are appropriate Heart: Regular rate and rhythm no rubs murmurs or extra sounds Lungs: Clear to auscultation, breathing unlabored, no rales or wheezes Abdomen: Positive bowel sounds, soft nontender to palpation, nondistended Extremities: No clubbing, cyanosis, or edema Skin: No evidence of breakdown, no evidence of rash  Neurologic: Cranial nerves II through XII intact, motor strength is 5/5 in left 3- RIght  deltoid, bicep, tricep, grip, hip flexor, knee extensors, ankle dorsiflexor and plantar flexor Sensory exam normal sensation to light touch and proprioception in bilateral upper and lower extremities Cerebellar exam normal finger to nose to finger as well as heel to shin in bilateral upper and lower extremities Musculoskeletal: Full range of motion in all 4 extremities. No joint swelling   Assessment/Plan: 1. Functional deficits which require 3+ hours per day of interdisciplinary therapy in a comprehensive inpatient rehab setting.  Physiatrist is  providing close team supervision and 24 hour management of active medical problems listed below.  Physiatrist and rehab team continue to assess barriers to discharge/monitor patient progress toward functional and medical goals  Care Tool:  Bathing    Body parts bathed by patient: Right arm,Chest,Abdomen,Right upper leg,Left upper leg,Face,Front perineal area,Buttocks,Left lower leg   Body parts bathed by helper: Left arm,Right lower leg     Bathing assist Assist Level: Minimal Assistance - Patient > 75%     Upper Body Dressing/Undressing Upper body dressing   What is the patient wearing?: Pull over shirt,Bra    Upper body assist Assist Level: Maximal Assistance - Patient 25 - 49%    Lower Body Dressing/Undressing Lower body dressing      What is the patient wearing?: Incontinence brief,Pants     Lower body assist Assist for lower body dressing: Moderate Assistance - Patient 50 - 74%     Toileting Toileting    Toileting assist Assist for toileting: Moderate Assistance - Patient 50 - 74%     Transfers Chair/bed transfer  Transfers assist     Chair/bed transfer assist level: Moderate Assistance - Patient 50 - 74%     Locomotion Ambulation   Ambulation assist      Assist level: Moderate Assistance - Patient 50 - 74% Assistive device: Walker-rolling Max distance: 15   Walk 10 feet activity   Assist  Walk 10 feet activity did not occur: Safety/medical concerns  Assist level: Moderate Assistance - Patient - 50 - 74% Assistive device: Walker-rolling   Walk 50 feet activity   Assist Walk 50 feet with 2 turns activity did not occur: Safety/medical concerns         Walk 150 feet activity   Assist Walk 150 feet activity did not occur: Safety/medical concerns         Walk 10 feet on uneven surface  activity   Assist Walk 10 feet on uneven surfaces activity did not occur: Safety/medical concerns         Wheelchair     Assist Will  patient use wheelchair at discharge?: Yes Type of Wheelchair: Manual    Wheelchair assist level: Supervision/Verbal cueing Max wheelchair distance: 40    Wheelchair 50 feet with 2 turns activity    Assist        Assist Level: Total Assistance - Patient < 25%   Wheelchair 150 feet activity     Assist      Assist Level: Total Assistance - Patient < 25%   Blood pressure (!) 160/76, pulse 88, temperature 99 F (37.2 C), resp. rate 17, height '5\' 2"'$  (1.575 m), weight 65.4 kg, SpO2 100 %.   Medical Problem List and Plan: 1.Visual changes and functional deficits secondary to acute nonhemorrhagic dorsal right pontine infarct as well as history of multiple CVA in the past with right-sided spastic weakness and slurred speech. ?MS lesions also -patient may shower -ELOS/Goals: 15-21 days, supervision to min assist with PT, OT, SLP 2. Antithrombotics: -DVT/anticoagulation:Subcutaneous heparin -antiplatelet therapy: Aspirin 81 mg daily and Plavix 75 mg daily 3. Pain Management:Tylenol as needed 4. Mood:Prozac 20 mg daily -antipsychotic agents: N/A 5. Neuropsych: This patientiscapable of making decisions on herown behalf. 6. Skin/Wound Care:Routine skin checks 7. Fluids/Electrolytes/Nutrition:Routine in and outs with follow-up chemistries 8. Hypertension. Norvasc 10 mg daily, Lopressor 100 mg twice daily. Monitor with increased mobility Vitals:   06/13/20 1922 06/14/20 0913  BP: (!) 157/77 (!) 160/76  Pulse: 91 88  Resp: 17   Temp: 99 F (37.2 C)   SpO2: 100%    9. Diabetes mellitus. Hemoglobin A1c 8.9. Lantus insulin 35 units nightly. Check blood sugars before meals and at bedtime.   CBG (last 3)  Recent Labs    06/14/20 0548 06/14/20 1141 06/14/20 1209  GLUCAP 87 64* 72  added hs snack , still low this a.m., received Lantus 25 units last night reduce lantus to 20 units.  10. CKD stage  IV. Latest baseline creatinine from February 2021 was 2.81. Follow-up chemistries 11.? Multiple sclerosis. Self-reported. Multiple new lesions on MRI c/w with diagnosis but pt apparently never has been treated.  -pt to f/u with Dr. Leonie Man as outpt to further explore.  12. GERD. Pepcid 13. Hyperlipidemia. Crestor   14.  Anemia will guaic stools which are still pending and recheck Hgb is stable at 7.7 on 1/29 check ortho vitals if symptomatic consider transfusion  LOS: 2 days A FACE TO FACE EVALUATION WAS PERFORMED  Charlett Blake 06/14/2020, 1:39 PM

## 2020-06-14 NOTE — Progress Notes (Signed)
Physical Therapy Session Note  Patient Details  Name: Hannah [name] MRN: [id number] Date of Birth: 1965-07-22  Today's Date: 06/14/2020 PT Individual Time: [id number] PT Individual Time Calculation (min): 67 min   Short Term Goals: Week 1:  PT Short Term Goal 1 (Week 1): Patient will complete bed mobility with MinA consistently PT Short Term Goal 2 (Week 1): Patient will transfers bed<> wc with LRAD and MinA consistently PT Short Term Goal 3 (Week 1): Patient will ambulate >16f with LRAD and ModA, 2nd person for wc follow if needed  Skilled Therapeutic Interventions/Progress Updates:    Pt received sitting in w/c and agreeable to therapy session. Transported to/from gym in w/c for time management and energy conservation. Sit<>stands to/from RW with min assist for lifting/balance - delayed trunk/hip extension to achieve upright - cuing for increased glute activation with improvements noted. Gait training ~128fusing RW, min/mod assist - demos very slow gait speed with B LE incoordination and delayed step initiation and slow speed of movement - increased extensor tone in R LE, increased anterior trunk lean and AD moving off towards L requiring frequent cuing to correct. Dynamic standing balance and R LE NMR via repeated R foot taps on/off 6" step targeting increased hip/knee flexion using B UE support on RW with CGA for steadying - cuing/faciliation for increased hamstring activation. Gait training ~3039fsing RW min/mod assist with same gait impairments as above and therapist intermittently facilitating increased R LE hip/knee flexion during swing - also demos R LE forefoot initial contact. Standing balance, no UE support, and R UE NMR task via placing clothespins on line with CGA/min assist for steadying/balance with intermittent anterior/posterior lean and continued very slow speed of movement and incoordination in R UE - pt reports significant challenge with this task. Stand pivot EOM>w/c using RW  with min assist for lifting and balance - cuing for sequencing. Ascended/descended 4 steps, 6" height, using B HRs with step-to pattern leading with L LE on ascent and R LE on descent - min assist for balance predominantly on descent due to posterior lean requiring cuing for correction. Transported back to room and pt requesting to lie down. R stand pivot w/c>EOB using RW with min assist as above. Sit>supine with min assist for BLE management. Pt left supine in bed with needs in reach and bed alarm on.  Therapy Documentation Precautions:  Precautions Precautions: Fall Precaution Comments: R spastic hemiparesis in UE/LE Restrictions Weight Bearing Restrictions: No  Pain:  Pt reports "my head hurts a little bit" like a headache pain - states it started during 2nd walk - assessed vitals: BP 153/82 (MAP 100), HR 79bpm. Provided rest breaks for pain management during session.  Therapy/Group: Individual Therapy  CarTawana ScalePT, DPT, CSRS  06/14/2020, 12:32 PM

## 2020-06-15 LAB — GLUCOSE, CAPILLARY
Glucose-Capillary: 104 mg/dL — ABNORMAL HIGH (ref 70–99)
Glucose-Capillary: 64 mg/dL — ABNORMAL LOW (ref 70–99)
Glucose-Capillary: 68 mg/dL — ABNORMAL LOW (ref 70–99)
Glucose-Capillary: 123 mg/dL — ABNORMAL HIGH (ref 70–99)
Glucose-Capillary: 198 mg/dL — ABNORMAL HIGH (ref 70–99)
Glucose-Capillary: 222 mg/dL — ABNORMAL HIGH (ref 70–99)

## 2020-06-15 NOTE — Progress Notes (Signed)
Hypoglycemic Event  CBG:64     Treatment: 4 oz juice/ peanut butter   Symptoms: no symptoms     Follow-up CBG: 1307 CBG Result: 123  Possible Reasons for Event:   Inadequate meal intake  Comments/MD notified:    Dayna Ramus

## 2020-06-16 LAB — GLUCOSE, CAPILLARY
Glucose-Capillary: 58 mg/dL — ABNORMAL LOW (ref 70–99)
Glucose-Capillary: 61 mg/dL — ABNORMAL LOW (ref 70–99)
Glucose-Capillary: 87 mg/dL (ref 70–99)
Glucose-Capillary: 208 mg/dL — ABNORMAL HIGH (ref 70–99)
Glucose-Capillary: 121 mg/dL — ABNORMAL HIGH (ref 70–99)
Glucose-Capillary: 98 mg/dL (ref 70–99)

## 2020-06-16 MED ORDER — BLOOD PRESSURE CONTROL BOOK
Freq: Once | Status: AC
  Filled 2020-06-16: qty 1

## 2020-06-16 MED ORDER — LIVING WELL WITH DIABETES BOOK
Freq: Once | Status: AC
  Filled 2020-06-16: qty 1

## 2020-06-16 MED ORDER — INSULIN GLARGINE 100 UNIT/ML ~~LOC~~ SOLN
19.0000 [IU] | Freq: Every day | SUBCUTANEOUS | Status: DC
  Administered 2020-06-16 – 2020-06-18 (×3): 19 [IU] via SUBCUTANEOUS
  Filled 2020-06-16 (×4): qty 0.19

## 2020-06-16 NOTE — Progress Notes (Addendum)
Speech Language Pathology Daily Session Note  Patient Details  Name: Hannah [name] MRN: [id number] Date of Birth: August 28, 1965  Today's Date: 06/16/2020 SLP Individual Time: 1005-1100 SLP Individual Time Calculation (min): 55 min  Short Term Goals: Week 1: SLP Short Term Goal 1 (Week 1): Pt will utilize memory aids to facilitate recall of daily information with supervision verbal cues. SLP Short Term Goal 2 (Week 1): Pt will selectively attend to tasks in a mildly distracting environment with supervision verbal cues for redirection to task. SLP Short Term Goal 3 (Week 1): Pt will complete mildly complex tasks with supervision verbal cues for functional problem solving.  Skilled Therapeutic Interventions: Pt seen for skilled ST in room this date, pt daughter present for initial portion of tx. Focus of ST on cognitive goals, pt continues to endorse decline in memory and cognition. SLP facilitating mildly complex problem solving task by providing min A verbal cues. Pt questions if stress of home environment is contributing to medical issues at this time. Pt provided education regarding relationship between sustained attention and encoding phase of memory, as well as memory strategies to increase functional recall. Pt demonstrates good carryover of information within tx session, will benefit from ongoing education and training. Pt encouraged to utilize cell phone as a tool for orientation and memory. Pt left in wheelchair with alarm set, all needs within reach. Cont ST POC.   Pain Pain Assessment Pain Scale: 0-10 Pain Score: 8  Pain Type: Acute pain Pain Location: Leg Pain Orientation: Left Pain Descriptors / Indicators: Stabbing Pain Onset: Sudden Patients Stated Pain Goal: 3 Pain Intervention(s): RN made aware Multiple Pain Sites: No  Therapy/Group: Individual Therapy  Dewaine Conger 06/16/2020, 11:15 AM

## 2020-06-16 NOTE — Progress Notes (Signed)
Physical Therapy Session Note  Patient Details  Name: Hannah [name] MRN: [id number] Date of Birth: [birth year]  Today's Date: 06/16/2020 PT Individual Time: 0800-0915 PT Individual Time Calculation (min): 75 min   Short Term Goals: Week 1:  PT Short Term Goal 1 (Week 1): Patient will complete bed mobility with MinA consistently PT Short Term Goal 2 (Week 1): Patient will transfers bed<> wc with LRAD and MinA consistently PT Short Term Goal 3 (Week 1): Patient will ambulate >65f with LRAD and ModA, 2nd person for wc follow if needed Week 2:    Week 3:     Skilled Therapeutic Interventions/Progress Updates:    PAIN denies pain  Pt initially supine and eating breakfast.  Discussed option of PWC during today's session.  Pt eager to persue.  Daughter in room and states [facility].  Discussed community trasportation options/educated re:  nontransportability of PW and pt/daughter expressed understanding.  Therapist obtained PSallisand inflated existing Roho due to decreased pressure/risk of bottoming out.   Pt rolled L/R for brief change/perineal care which she performs w/set up for groin, total assist for back/buttocks. Mod assist for donning pants. supne to side to sit w/mod assist.  Pt dons tight fitting sports bra w/max assist. Dons clean shirt w/set up and min assist.   Sit to stand and stand pivot [facility] and min assist.   Therapist demonstrated controls including adjustement of wc parts as well as propulsion, power on/off and process for charging wc.    Pt practiced operation of controls and propulsion on unit using slowest speed setting, moderate cues and mod assist overall for straight path and simple L/R turns.  Headrest adjusted for pt comfort by therapist.  builups to legrests due to excess length.  Once back in room, reviewed process for parts management to allow for repositioning as needed.  Discussed arranging wc consult w/vendor and pt w/definite  interest in persuing this option.  Due to significant change in function, very limited gait function and high energy demand of this activity/progressive MS, pt would benefit from persuing PWC as means of functional mobility.  Pt left oob in wc w/alarm belt set and needs in reach.    Therapy Documentation Precautions:  Precautions Precautions: Fall Precaution Comments: R spastic hemiparesis in UE/LE Restrictions Weight Bearing Restrictions: No    Therapy/Group: Individual Therapy  BCallie Fielding PSt. Martin1/31/2022, 1:00 PM

## 2020-06-16 NOTE — Progress Notes (Addendum)
[place] PHYSICAL MEDICINE & REHABILITATION PROGRESS NOTE   Subjective/Complaints: Working with therapy Daughter at bedside very pleasant and asks about d/c date- informed It will be determined on Wednesday. She asks about stroke vs. MS exacerbation.  ROS- neg CP, SOB, N/V/D Objective:   No results found. Recent Labs    06/14/20 0553  WBC 7.0  HGB 7.7*  HCT 22.6*  PLT 199   No results for input(s): NA, K, CL, CO2, GLUCOSE, BUN, CREATININE, CALCIUM in the last 72 hours.  Intake/Output Summary (Last 24 hours) at 06/16/2020 1131 Last data filed at 06/16/2020 0900 Gross per 24 hour  Intake 320 ml  Output --  Net 320 ml        Physical Exam: Vital Signs Blood pressure (!) 148/74, pulse 79, temperature 98.1 F (36.7 C), resp. rate 16, height '5\' 2"'$  (1.575 m), weight 65.4 kg, SpO2 98 %. Gen: no distress, normal appearing HEENT: oral mucosa pink and moist, NCAT Cardio: Reg rate Chest: normal effort, normal rate of breathing Abd: soft, non-distended Ext: no edema Psych: pleasant, normal affect Skin: intact Neurologic: Cranial nerves II through XII intact, motor strength is 5/5 in left 3- RIght  deltoid, bicep, tricep, grip, hip flexor, knee extensors, ankle dorsiflexor and plantar flexor Sensory exam normal sensation to light touch and proprioception in bilateral upper and lower extremities Cerebellar exam normal finger to nose to finger as well as heel to shin in bilateral upper and lower extremities Musculoskeletal: Full range of motion in all 4 extremities. No joint swelling   Assessment/Plan: 1. Functional deficits which require 3+ hours per day of interdisciplinary therapy in a comprehensive inpatient rehab setting.  Physiatrist is providing close team supervision and 24 hour management of active medical problems listed below.  Physiatrist and rehab team continue to assess barriers to discharge/monitor patient progress toward functional and medical goals  Care  Tool:  Bathing    Body parts bathed by patient: Right arm,Chest,Abdomen,Right upper leg,Left upper leg,Face,Front perineal area,Buttocks,Left lower leg   Body parts bathed by helper: Left arm,Right lower leg     Bathing assist Assist Level: Minimal Assistance - Patient > 75%     Upper Body Dressing/Undressing Upper body dressing   What is the patient wearing?: Pull over shirt    Upper body assist Assist Level: Maximal Assistance - Patient 25 - 49%    Lower Body Dressing/Undressing Lower body dressing      What is the patient wearing?: Underwear/pull up     Lower body assist Assist for lower body dressing: Moderate Assistance - Patient 50 - 74%     Toileting Toileting    Toileting assist Assist for toileting: Moderate Assistance - Patient 50 - 74%     Transfers Chair/bed transfer  Transfers assist     Chair/bed transfer assist level: Minimal Assistance - Patient > 75% Chair/bed transfer assistive device: Programmer, multimedia   Ambulation assist      Assist level: Minimal Assistance - Patient > 75% Assistive device: Walker-rolling Max distance: 12f   Walk 10 feet activity   Assist  Walk 10 feet activity did not occur: Safety/medical concerns  Assist level: Moderate Assistance - Patient - 50 - 74% Assistive device: Walker-rolling   Walk 50 feet activity   Assist Walk 50 feet with 2 turns activity did not occur: Safety/medical concerns         Walk 150 feet activity   Assist Walk 150 feet activity did not occur: Safety/medical concerns  Walk 10 feet on uneven surface  activity   Assist Walk 10 feet on uneven surfaces activity did not occur: Safety/medical concerns         Wheelchair     Assist Will patient use wheelchair at discharge?: Yes Type of Wheelchair: Manual    Wheelchair assist level: Supervision/Verbal cueing Max wheelchair distance: 40    Wheelchair 50 feet with 2 turns  activity    Assist        Assist Level: Total Assistance - Patient < 25%   Wheelchair 150 feet activity     Assist      Assist Level: Total Assistance - Patient < 25%   Blood pressure (!) 148/74, pulse 79, temperature 98.1 F (36.7 C), resp. rate 16, height '5\' 2"'$  (1.575 m), weight 65.4 kg, SpO2 98 %.   Medical Problem List and Plan: 1.Visual changes and functional deficits secondary to acute nonhemorrhagic dorsal right pontine infarct as well as history of multiple CVA in the past with right-sided spastic weakness and slurred speech. ?MS lesions also -patient may shower -ELOS/Goals: 15-21 days, supervision to min assist with PT, OT, SLP  Continue CIR 2. Antithrombotics: -DVT/anticoagulation:Subcutaneous heparin -antiplatelet therapy: Aspirin 81 mg daily and Plavix 75 mg daily 3. Pain Management:Tylenol as needed 4. Mood:Continue Prozac 20 mg daily -antipsychotic agents: N/A 5. Neuropsych: This patientiscapable of making decisions on herown behalf. 6. Skin/Wound Care:Routine skin checks 7. Fluids/Electrolytes/Nutrition:Routine in and outs with follow-up chemistries 8. Hypertension. Continue Norvasc 10 mg daily, Lopressor 100 mg twice daily. Monitor with increased mobility Vitals:   06/15/20 1927 06/16/20 0425  BP: (!) 151/75 (!) 148/74  Pulse: 82 79  Resp: 15 16  Temp: 98.7 F (37.1 C) 98.1 F (36.7 C)  SpO2: 100% 98%   9. Diabetes mellitus. Hemoglobin A1c 8.9. Lantus insulin 35 units nightly. Check blood sugars before meals and at bedtime.   CBG (last 3)  Recent Labs    06/16/20 0559 06/16/20 0620 06/16/20 0644  GLUCAP 58* 61* 87  1/31 CBG 58 this morning: reduced Lantus to 19U  10. CKD stage IV. Latest baseline creatinine from February 2021 was 2.81. Cr 3.98 on 1/28, repeat tomorrow given upward trend.  11.? Multiple sclerosis. Self-reported. Multiple new lesions on MRI  c/w with diagnosis but pt apparently never has been treated.  -pt to f/u with Dr. Leonie Man as outpt to further explore.  12. GERD. Pepcid 13. Hyperlipidemia. Crestor 14.  Anemia will guaic stools which are still pending and recheck Hgb is stable at 7.7 on 1/29 check ortho vitals if symptomatic consider transfusion   Repeat CBC tomorrow LOS: 4 days A FACE TO FACE EVALUATION WAS PERFORMED  Martha Clan P Cassie Shedlock 06/16/2020, 11:31 AM

## 2020-06-16 NOTE — Progress Notes (Signed)
Occupational Therapy Session Note  Patient Details  Name: Hannah [name] MRN: [id number] Date of Birth: [birth year]  Today's Date: 06/16/2020 OT Individual Time: 1305-1402 OT Individual Time Calculation (min): 57 min    Short Term Goals: Week 1:  OT Short Term Goal 1 (Week 1): Pt will completed UB dressing with min assist for pullover shirt only. OT Short Term Goal 2 (Week 1): Pt will donn her underpants and pants with min assist sit to stand. OT Short Term Goal 3 (Week 1): Pt will complete toilet transfer stand pivot with min assist. OT Short Term Goal 4 (Week 1): Pt will complete shower/tub transfer with min assist to the tub bench. OT Short Term Goal 5 (Week 1): Pt will complete visual tracking exercises with supervision following handout to help increase visual coordination and decrease diplopia.  Skilled Therapeutic Interventions/Progress Updates:    Treatment session with focus on vision and RUE NMR.  Pt received upright in power w/c reporting pain in LLE- premedicated.  Pt reports PT provided pt with power w/c due to progressive MS and pt just starting training with power w/c this date.  Therapist re-educated on controls and seating functions as pt attempting to fix chair from reclined position.  Pt required mod assist with managing power w/c controls, especially when maneuvering quick turns in hallway and around obstacles.  Engaged in table top task with focus on visual coordination and fixation of singular item to address diplopia while completing block stacking tasks with RUE.  Pt demonstrating gross grasp when picking up large foam blocks with min decreased coordination when stacking and manipulating blocks.  Therapist increased challenge to threading beads with RUE, progressing from beads with large holes to small holes to increase challenge with coordination.  Pt able to maintain both eyes open during task with no reports of diplopia this session.  Returned to room with min assist for  navigating and controlling power w/c.  Pt completed sit > stand from power w/c with RW with min assist and completed stand pivot transfer to EOB with min assist.  Pt required assistance to lift BLE in to bed due to positioning and pain in LLE.  Pt remained in sidelying with all needs in reach.  Therapy Documentation Precautions:  Precautions Precautions: Fall Precaution Comments: R spastic hemiparesis in UE/LE Restrictions Weight Bearing Restrictions: No General:   Vital Signs: Therapy Vitals Temp: 98.2 F (36.8 C) Temp Source: Oral Pulse Rate: 77 Resp: 20 BP: 136/71 Patient Position (if appropriate): Sitting Oxygen Therapy SpO2: 100 % O2 Device: Room Air Pain: Pain Assessment Pain Scale: 0-10 Pain Score: 8  Pain Type: Acute pain Pain Location: Leg Pain Orientation: Left Pain Intervention(s): RN made aware   Therapy/Group: Individual Therapy  Simonne Come 06/16/2020, 3:13 PM

## 2020-06-16 NOTE — Progress Notes (Signed)
Patient ID: Hannah [name], female   DOB: [birth year], 55 y.o.   MRN: [id number] Met with the patient to review the role of the nurse CM and collaboration with the SW to facilitate preparation for discharge. Reviewed secondary stroke risks including T2 DM, HTN, HLD, obesity/sedentary lifestyle. Reviewed other health issues including MS and CKD with albumin 2.4. Discussed DASH diet and Renal diet modifications for CKD and DAPT post stroke. Patient given handbooks on HTN and DM management to share with her daughter. Reports checking CBGs and administering insulin PTA but not always compliant with medications, forgetful. Continue to follow along to discharge and address educational needs with patient and daughter. No other questions or concerns noted at present. Margarito Liner

## 2020-06-17 LAB — GLUCOSE, CAPILLARY
Glucose-Capillary: 162 mg/dL — ABNORMAL HIGH (ref 70–99)
Glucose-Capillary: 94 mg/dL (ref 70–99)
Glucose-Capillary: 204 mg/dL — ABNORMAL HIGH (ref 70–99)
Glucose-Capillary: 110 mg/dL — ABNORMAL HIGH (ref 70–99)

## 2020-06-17 LAB — BASIC METABOLIC PANEL
Anion gap: 8 (ref 5–15)
BUN: 36 mg/dL — ABNORMAL HIGH (ref 6–20)
CO2: 23 mmol/L (ref 22–32)
Calcium: 8.5 mg/dL — ABNORMAL LOW (ref 8.9–10.3)
Chloride: 106 mmol/L (ref 98–111)
Creatinine, Ser: 4.42 mg/dL — ABNORMAL HIGH (ref 0.44–1.00)
GFR, Estimated: 11 mL/min — ABNORMAL LOW (ref >60)
Glucose, Bld: 147 mg/dL — ABNORMAL HIGH (ref 70–99)
Potassium: 4.4 mmol/L (ref 3.5–5.1)
Sodium: 137 mmol/L (ref 135–145)

## 2020-06-17 MED ORDER — SODIUM CHLORIDE 0.45 % IV SOLN
INTRAVENOUS | Status: DC
  Administered 2020-06-21: 900 mL via INTRAVENOUS

## 2020-06-17 NOTE — Progress Notes (Signed)
[place] PHYSICAL MEDICINE & REHABILITATION PROGRESS NOTE   Subjective/Complaints: No new issues , daughter at bedside  ROS- neg CP, SOB, N/V/D Objective:   No results found. No results for input(s): WBC, HGB, HCT, PLT in the last 72 hours. Recent Labs    06/17/20 0454  NA 137  K 4.4  CL 106  CO2 23  GLUCOSE 147*  BUN 36*  CREATININE 4.42*  CALCIUM 8.5*    Intake/Output Summary (Last 24 hours) at 06/17/2020 1220 Last data filed at 06/16/2020 1825 Gross per 24 hour  Intake 240 ml  Output -  Net 240 ml        Physical Exam: Vital Signs Blood pressure (!) 156/79, pulse 83, temperature 98.2 F (36.8 C), resp. rate 18, height '5\' 2"'$  (1.575 m), weight 65.4 kg, SpO2 100 %.  General: No acute distress Mood and affect are appropriate Heart: Regular rate and rhythm no rubs murmurs or extra sounds Lungs: Clear to auscultation, breathing unlabored, no rales or wheezes Abdomen: Positive bowel sounds, soft nontender to palpation, nondistended Extremities: No clubbing, cyanosis, or edema Skin: No evidence of breakdown, no evidence of rash  Neurologic: Cranial nerves II through XII intact, motor strength is 5/5 in left 3- RIght  deltoid, bicep, tricep, grip, hip flexor, knee extensors, ankle dorsiflexor and plantar flexor Sensory exam normal sensation to light touch and proprioception in bilateral upper and lower extremities Cerebellar exam normal finger to nose to finger as well as heel to shin in bilateral upper and lower extremities Musculoskeletal: Full range of motion in all 4 extremities. No joint swelling   Assessment/Plan: 1. Functional deficits which require 3+ hours per day of interdisciplinary therapy in a comprehensive inpatient rehab setting.  Physiatrist is providing close team supervision and 24 hour management of active medical problems listed below.  Physiatrist and rehab team continue to assess barriers to discharge/monitor patient progress toward  functional and medical goals  Care Tool:  Bathing    Body parts bathed by patient: Right arm,Chest,Abdomen,Right upper leg,Left upper leg,Face,Front perineal area,Buttocks,Left lower leg   Body parts bathed by helper: Left arm,Right lower leg     Bathing assist Assist Level: Minimal Assistance - Patient > 75%     Upper Body Dressing/Undressing Upper body dressing   What is the patient wearing?: Pull over shirt    Upper body assist Assist Level: Maximal Assistance - Patient 25 - 49%    Lower Body Dressing/Undressing Lower body dressing      What is the patient wearing?: Underwear/pull up     Lower body assist Assist for lower body dressing: Moderate Assistance - Patient 50 - 74%     Toileting Toileting    Toileting assist Assist for toileting: Moderate Assistance - Patient 50 - 74%     Transfers Chair/bed transfer  Transfers assist     Chair/bed transfer assist level: Minimal Assistance - Patient > 75% Chair/bed transfer assistive device: Programmer, multimedia   Ambulation assist      Assist level: Minimal Assistance - Patient > 75% Assistive device: Walker-rolling Max distance: 42f   Walk 10 feet activity   Assist  Walk 10 feet activity did not occur: Safety/medical concerns  Assist level: Moderate Assistance - Patient - 50 - 74% Assistive device: Walker-rolling   Walk 50 feet activity   Assist Walk 50 feet with 2 turns activity did not occur: Safety/medical concerns         Walk 150 feet activity   Assist  Walk 150 feet activity did not occur: Safety/medical concerns         Walk 10 feet on uneven surface  activity   Assist Walk 10 feet on uneven surfaces activity did not occur: Safety/medical concerns         Wheelchair     Assist Will patient use wheelchair at discharge?: Yes Type of Wheelchair: Power    Wheelchair assist level: Moderate Assistance - Patient 50 - 74% Max wheelchair distance: 150     Wheelchair 50 feet with 2 turns activity    Assist        Assist Level: Moderate Assistance - Patient 50 - 74%   Wheelchair 150 feet activity     Assist      Assist Level: Moderate Assistance - Patient 50 - 74%   Blood pressure (!) 156/79, pulse 83, temperature 98.2 F (36.8 C), resp. rate 18, height '5\' 2"'$  (1.575 m), weight 65.4 kg, SpO2 100 %.   Medical Problem List and Plan: 1.Visual changes and functional deficits secondary to acute nonhemorrhagic dorsal right pontine infarct as well as history of multiple CVA in the past with right-sided spastic weakness and slurred speech. ?MS lesions also -patient may shower -ELOS/Goals: 15-21 days, supervision to min assist with PT, OT, SLP  Continue CIR 2. Antithrombotics: -DVT/anticoagulation:Subcutaneous heparin -antiplatelet therapy: Aspirin 81 mg daily and Plavix 75 mg daily 3. Pain Management:Tylenol as needed 4. Mood:Continue Prozac 20 mg daily -antipsychotic agents: N/A 5. Neuropsych: This patientiscapable of making decisions on herown behalf. 6. Skin/Wound Care:Routine skin checks 7. Fluids/Electrolytes/Nutrition:Routine in and outs with follow-up chemistries 8. Hypertension. Continue Norvasc 10 mg daily, Lopressor 100 mg twice daily. Monitor with increased mobility Vitals:   06/16/20 1933 06/17/20 0517  BP: (!) 146/68 (!) 156/79  Pulse: 82 83  Resp: 17 18  Temp: 98.2 F (36.8 C) 98.2 F (36.8 C)  SpO2: 98% 100%   9. Diabetes mellitus. Hemoglobin A1c 8.9. Lantus insulin 35 units nightly. Check blood sugars before meals and at bedtime.   CBG (last 3)  Recent Labs    06/16/20 2107 06/17/20 0602 06/17/20 1144  GLUCAP 98 162* 94  2/1 am CBG ok, cont  Lantus 19U  10. CKD stage IV. Latest baseline creatinine from February 2021 was 2.81. Cr 3.98 on 1/28, repeat tomorrow given upward trend.  36/4.42 on 2/1, poor fluid intake,  start IVF at noc 11.? Multiple sclerosis. Self-reported. Multiple new lesions on MRI c/w with diagnosis but pt apparently never has been treated.  -pt to f/u with Dr. Leonie Man as outpt to further explore.  12. GERD. Pepcid 13. Hyperlipidemia. Crestor 14.  Anemia will guaic stools which are still pending and recheck Hgb is stable at 7.7 on 1/29 check ortho vitals if symptomatic consider transfusion    LOS: 5 days A FACE TO [place] E Tonji Elliff 06/17/2020, 12:20 PM

## 2020-06-17 NOTE — Progress Notes (Signed)
Occupational Therapy Session Note  Patient Details  Name: Hannah [name] MRN: [id number] Date of Birth: 09/08/1965  Today's Date: 06/17/2020 OT Individual Time: 1409-1505 OT Individual Time Calculation (min): 56 min    Short Term Goals: Week 1:  OT Short Term Goal 1 (Week 1): Pt will completed UB dressing with min assist for pullover shirt only. OT Short Term Goal 2 (Week 1): Pt will donn her underpants and pants with min assist sit to stand. OT Short Term Goal 3 (Week 1): Pt will complete toilet transfer stand pivot with min assist. OT Short Term Goal 4 (Week 1): Pt will complete shower/tub transfer with min assist to the tub bench. OT Short Term Goal 5 (Week 1): Pt will complete visual tracking exercises with supervision following handout to help increase visual coordination and decrease diplopia.  Skilled Therapeutic Interventions/Progress Updates:    Pt worked on bathing shower level during session per her request.  She complete functional mobility from her power wheelchair at bed side to the shower bench with use of the RW and min assist.  Once at the seat, she was able to undress with mod assist for UB and min assist for LB except for removal of TEDs, which was total assist.  She completed all bathing with min assist overall, requiring assist to wash her back and min assist for standing when washing and rinsing her buttocks and peri area.  She dried off and opted to donn a gown since she had no further therapy this afternoon.  Min assist for ambulation back to the bed and for transition to supine.  Call button and phone in reach with bed alarm in place.  Pt reports increased blurriness with her vision compared to baseline but no diplopia.    Therapy Documentation Precautions:  Precautions Precautions: Fall Precaution Comments: R spastic hemiparesis in UE/LE Restrictions Weight Bearing Restrictions: No  Pain: Pain Assessment Pain Scale: Faces Pain Score: 0-No pain ADL: See Care  Tool Section for some details of mobility and selfcare  Therapy/Group: Individual Therapy  Bettie Capistran OTR/L 06/17/2020, 4:05 PM

## 2020-06-17 NOTE — Progress Notes (Signed)
Speech Language Pathology Daily Session Note  Patient Details  Name: Lannah Messineo MRN: [id number] Date of Birth: June 21, 1965  Today's Date: 06/17/2020 SLP Individual Time: 0815-0855 SLP Individual Time Calculation (min): 40 min  Short Term Goals: Week 1: SLP Short Term Goal 1 (Week 1): Pt will utilize memory aids to facilitate recall of daily information with supervision verbal cues. SLP Short Term Goal 2 (Week 1): Pt will selectively attend to tasks in a mildly distracting environment with supervision verbal cues for redirection to task. SLP Short Term Goal 3 (Week 1): Pt will complete mildly complex tasks with supervision verbal cues for functional problem solving.  Skilled Therapeutic Interventions: Pt was seen for skilled ST targeting cognitive goals. Pt's daughter was present at bedside throughout session. SLP facilitated session with a semi-complex monthly calendar/scheduling task, which pt completed with extra time and Supervision A verbal cues for problem solving. Pt required Moderate cues to recall goals set for ST after evaluation. She selectively attended to tasks throughout session with Min A verbal cues for redirection. Pt left sitting in bed with alarm  set and needs within reach, daughter still present. Continue per current plan of care.          Pain Pain Assessment Pain Scale: 0-10 Pain Score: 0-No pain  Therapy/Group: Individual Therapy  Arbutus Leas 06/17/2020, 7:21 AM

## 2020-06-17 NOTE — Progress Notes (Addendum)
Physical Therapy Session Note  Patient Details  Name: Hannah [name] MRN: [id number] Date of Birth: 11/18/1965  Today's Date: 06/17/2020 PT Individual Time: 0922-1025 and [id number] PT Individual Time Calculation (min): 63 min and 43 min  Short Term Goals: Week 1:  PT Short Term Goal 1 (Week 1): Patient will complete bed mobility with MinA consistently PT Short Term Goal 2 (Week 1): Patient will transfers bed<> wc with LRAD and MinA consistently PT Short Term Goal 3 (Week 1): Patient will ambulate >64f with LRAD and ModA, 2nd person for wc follow if needed  Skilled Therapeutic Interventions/Progress Updates:    Session 1: Pt received supine in bed with her daughter present and pt agreeable to therapy session. Supine>sitting R EOB, HOB elevated and using bedrail, supervision. Sitting EOB donned pants max assist and sit>stand EOB>RW min assist for balance. Standing using B UE support on RW pulled pants over hips max assist. Gait training ~21fin room using RW min assist and 1x heavier min assist due to poor R LE positioning - continues to demos R LE hypertonia resulting in poor coordination and control of swing phase gait mechanics with excessive ankle PF and decreased hip/knee flexion. Discussed trialing AFO again with daughter planning to bring in different tennis shoes to allow fit of brace. Pt's daughter confirms that pt's gait is impaired compared to prior (primarily R LE) but does state pt's gait speed is close to baseline. Discussed power w/c with plan for custom power w/c consultation Thursday 06/19/20 - pt excited and daughter confirms it will fit in the home. Pt demos management of turning power w/c on/off and managing adjusting power w/c parts with min cuing - able to recall safety awareness of turning off the chair when no longer moving, reinforced this education. Performed ~15093fower w/c mobility out of her room and to main therapy gym with close supervision and a few instances of min assist  to turn around in small spaces with education/training on how to turn more tightly using mid-wheel drive. Gait training ~52f47f using RW with min assist for balance - continues to demo excessive anterior trunk lean, R LE hypertonia with impaired coordination resulting in decreased step length and toe touching on initial contact, and decreased gait speed. Performed ~90ft13fpower w/c mobility back towards room navigating out of therapy gym door with close supervision and min cuing. Transported remainder of distance back to room having pt performed power w/c mobility into the room with mod cuing and hand-over-hand assistance for navigation in small spaces and positioning of w/c. Pt left seated in power w/c with needs in reach, seat belt on, and her daughter present.   Session 2: Pt received sitting in power w/c and agreeable to therapy session though reporting some fatigue. Pt demos good safety awareness using power w/c throughout session remembering to turn it off after driving and always wearing seat belt. Therapist educated on importance of fully tilting back for 1 minute every ~30min58mur for pressure relief and educated pt on how to perform this function. Power w/c mobility ~152ft o76ff the room and down towards ADL apartment - requires cuing to navigate out of room and avoid obstacles but demos improving w/c navigation in larger spaces. Power w/c mobility into and out of ADL apartment through the kitchen with pt requiring hand-over-hand assistance to navigate into ADL apartment door without hitting furniture but demos good carryover of how to turn w/c around 180degrees as learned in AM session to turn around in  kitchen with only min cuing; however, when driving through kitchen pt tends to veer R and bumps into fridge requiring cuing and hand-over-hand assistance to adjust and realign w/c. (Of note: power w/c steer alignment is tilted making driving straight more challenging) Sit>stand power w/c>RW CGA/min  assist for steadying/balance. Standing balance and tolerance along with R UE NMR task via cross body reaching to place clothespins on basketball goal - tolerates standing ~3-57mnutes with pt reporting significant fatigue - CGA for steadying during and increased time to manipulate clothespins and place them on net. Transported back to room dependent assist for time management. Pt left seated in power w/c with needs in reach and seat belt on. Provided pt with Discharge Planning/Home Measurement Sheet for daughter to take home measurements in preparation for power w/c consultation.  Addendum: During both session pt using power wheelchair in "slow" setting at the slowest speed.  Therapy Documentation Precautions:  Precautions Precautions: Fall Precaution Comments: R spastic hemiparesis in UE/LE Restrictions Weight Bearing Restrictions: No  Pain:   Session 1: No reports of pain throughout session.  Session 2: No reports of pain throughout session.   Therapy/Group: Individual Therapy  CTawana Scale, PT, DPT, CSRS  06/17/2020, 7:51 AM

## 2020-06-18 LAB — GLUCOSE, CAPILLARY
Glucose-Capillary: 91 mg/dL (ref 70–99)
Glucose-Capillary: 129 mg/dL — ABNORMAL HIGH (ref 70–99)
Glucose-Capillary: 100 mg/dL — ABNORMAL HIGH (ref 70–99)
Glucose-Capillary: 144 mg/dL — ABNORMAL HIGH (ref 70–99)

## 2020-06-18 MED ORDER — NEPRO/CARBSTEADY PO LIQD
237.0000 mL | Freq: Two times a day (BID) | ORAL | Status: DC
  Administered 2020-06-18 – 2020-06-26 (×15): 237 mL via ORAL

## 2020-06-18 MED ORDER — TIZANIDINE HCL 2 MG PO TABS
2.0000 mg | ORAL_TABLET | Freq: Three times a day (TID) | ORAL | Status: DC
  Administered 2020-06-18 – 2020-06-26 (×24): 2 mg via ORAL
  Filled 2020-06-18 (×24): qty 1

## 2020-06-18 NOTE — Progress Notes (Signed)
Patient ID: Hannah [name], female   DOB: Jan 31, 1966, 55 y.o.   MRN: [id number] Team Conference Report to Patient/Family  Team Conference discussion was reviewed with the patient and caregiver, including goals, any changes in plan of care and target discharge date.  Patient and caregiver express understanding and are in agreement.  The patient has a target discharge date of 06/26/20.  Dyanne Iha 06/18/2020, 2:07 PM

## 2020-06-18 NOTE — Progress Notes (Signed)
Physical Therapy Session Note  Patient Details  Name: Hannah [name] MRN: [id number] Date of Birth: 07/23/1965  Today's Date: 06/18/2020 PT Individual Time: 0920-1030 PT Individual Time Calculation (min): 70 min   Short Term Goals: Week 1:  PT Short Term Goal 1 (Week 1): Patient will complete bed mobility with MinA consistently PT Short Term Goal 2 (Week 1): Patient will transfers bed<> wc with LRAD and MinA consistently PT Short Term Goal 3 (Week 1): Patient will ambulate >52f with LRAD and ModA, 2nd person for wc follow if needed  Skilled Therapeutic Interventions/Progress Updates:    Pt received sitting in power w/c with her daughter present and pt agreeable to therapy session. Pt's daughter had brought in different tennis shoes but they were still too tight - planning to bring a different pair tomorrow. Pt able to turn on power w/c without cuing but without reading glasses requires cuing to change to "slow" setting and ensure it is on the slowest speed in "slow." Pt able to navigate out of room in power w/c without assist but then when turning L out of the room had to cue for attention to wall on R side with pt stating she did not see it - educated on making sure she visually scans to R. Navigated remainder of distance to therapy gym with supervision and pt demos good w/c control to turn R into main therapy gym with supervision. Gait training 856fusing RW with CGA/min assist for balance - continues to have R LE ataxia and hypertonia resulting in slow speed of movement during swing phase and then ataxic increased movement on initial contact when accepting weight bearing - cuing for increased hip/trunk extension and glute activation to maintain trunk upright - continues to walk at very decreased gait speed. Due to MS fatigue ambulation is not a functional means of mobility for patient. Sitting EOM performed R LE knee flexion/extension PROM for stretching with noticeable increased resistance to  movement in both directions. Standing R LE NMR hamstring curls with min assist to achieve full ROM. Donned R LE ankle DF assist ACE wrap with eversion bias to assist with heel strike and tone management. Gait training ~6055fsing RW with CGA/min assist for balance and cuing for increased R knee flexion and intermittent facilitation for improved R LE swing phase gait mechanics. Pt reporting fatigue. After therapeutic rest break continued. Repeated sit<>stands to/from EOM B HHA to decrease UE support - min assist for lifting and balance in standing due to increased anterior/posterior postural sway/trunk ataxia - cuing for anterior trunk lean/weight shift followed by trunk/hip extension to achieve upright. L stand pivot transfer EOM>w/c using RW CGA/min assist for balance. Ascended/descended 4 steps using B HRs with min assist and therapist cuing for reciprocal stepping pattern for R LE NMR. Pt performed power w/c mobility out of therapy gym into hallway through doorway with supervision demoing improvement in control and then transported remainder of distance back to room dependently for time management. Pt left seated in w/c with seat belt on, needs in reach, and her daughter present.  Therapy Documentation Precautions:  Precautions Precautions: Fall Precaution Comments: R spastic hemiparesis in UE/LE Restrictions Weight Bearing Restrictions: No  Pain:   Denies pain during session.  Therapy/Group: Individual Therapy  CarTawana ScalePT, DPT, CSRS  06/18/2020, 7:55 AM

## 2020-06-18 NOTE — Progress Notes (Signed)
Speech Language Pathology Daily Session Note  Patient Details  Name: Shamsa Hopman MRN: [id number] Date of Birth: [birth year]  Today's Date: 06/18/2020 SLP Individual Time: 1104-1200 SLP Individual Time Calculation (min): 56 min  Short Term Goals: Week 1: SLP Short Term Goal 1 (Week 1): Pt will utilize memory aids to facilitate recall of daily information with supervision verbal cues. SLP Short Term Goal 2 (Week 1): Pt will selectively attend to tasks in a mildly distracting environment with supervision verbal cues for redirection to task. SLP Short Term Goal 3 (Week 1): Pt will complete mildly complex tasks with supervision verbal cues for functional problem solving.  Skilled Therapeutic Interventions: Pt was seen for skilled ST targeting cognitive goals. SLP facilitated session with a basic emergent awareness task, in which pt identified errors. She was Mod I for identifying illogical entries on the calendar, but Supervision A verbal cues required for identification of more detailed mistakes/errors required. During a PEG board task, pt recreating mildly complex to complex designs from photographs on a board in front of her with overall Min A verbal cues for error awareness and problem solving of the complex designs. Pt did not required any cues for redirection to tasks today. She did struggle with demonstrating recall of how to maneuver her power wheelchair to move from front to back, although she did demonstrate how to turn left and right. Pt left sitting in chair with alarm set and call bell in hand. Continue per current plan of care.       Pain Pain Assessment Pain Scale: 0-10 Pain Score: 0-No pain  Therapy/Group: Individual Therapy  Arbutus Leas 06/18/2020, 7:24 AM

## 2020-06-18 NOTE — Progress Notes (Signed)
Occupational Therapy Session Note  Patient Details  Name: Hannah [name] MRN: [id number] Date of Birth: May 13, 1966  Today's Date: 06/18/2020 OT Individual Time: [id number] OT Individual Time Calculation (min): 58 min    Short Term Goals: Week 1:  OT Short Term Goal 1 (Week 1): Pt will completed UB dressing with min assist for pullover shirt only. OT Short Term Goal 2 (Week 1): Pt will donn her underpants and pants with min assist sit to stand. OT Short Term Goal 3 (Week 1): Pt will complete toilet transfer stand pivot with min assist. OT Short Term Goal 4 (Week 1): Pt will complete shower/tub transfer with min assist to the tub bench. OT Short Term Goal 5 (Week 1): Pt will complete visual tracking exercises with supervision following handout to help increase visual coordination and decrease diplopia.  Skilled Therapeutic Interventions/Progress Updates:    Pt in bed to start session.  She was able to transfer to the EOB with min assist and min instructional cueing for technique.  She then worked on scooting to the EOB with min guard assist.  Had her work on dressing sit to stand with supervision for donning sports bra and pullover shirt with min assist to donn underpants and pants with min assist.  Therapist then assisted with donning TEDs and she was able to donn her slip on shoes with supervision.  She then completed stand pivot transfer to the power wheelchair with min assist and mod instructional cueing to look at her positioning in relation to the wheelchair as she was positioned to the side of it slightly instead of in front of it.   Once in the power wheelchair had her work on controlling it out in the hallway with one circle around the dayroom and back to her room.  She needed mod assist secondary to having difficulty avoiding obstacles on the right side including the sink and doorways.  She also needed min to mod assist with backing the wheelchair up beside of the bed.  She was left with lap  belt in place and with the call button and phone in reach. Her daughter was present as well.   Therapy Documentation Precautions:  Precautions Precautions: Fall Precaution Comments: R spastic hemiparesis in UE/LE Restrictions Weight Bearing Restrictions: No  Pain: Pain Assessment Pain Scale: Faces Pain Score: 0-No pain ADL: See Care Tool Section for some details of mobility and selfcare  Therapy/Group: Individual Therapy  Dryden Tapley OTR/L 06/18/2020, 10:23 AM

## 2020-06-18 NOTE — Progress Notes (Signed)
Patient ID: Hannah [name], female   DOB: 04-14-1966, 55 y.o.   MRN: [id number]   SW made attempt to call daughter to schedule family education, no answer.  [place], [place]

## 2020-06-18 NOTE — Patient Care Conference (Signed)
Inpatient RehabilitationTeam Conference and Plan of Care Update Date: 06/18/2020   Time: 10:55 AM    Patient Name: Hannah [name]      Medical Record Number: [id number]  Date of Birth: 15-Mar-1966 Sex: Female         Room/Bed: 4W13C/4W13C-01 Payor Info: Payor: [employer] MEDICARE / Plan: [facility] MEDICARE / Product Type: *No Product type* /    Admit Date/Time:  06/12/2020  3:33 PM  Primary Diagnosis:  Right pontine cerebrovascular accident [facility])  Hospital Problems: Principal Problem:   Right pontine cerebrovascular accident [facility])    Expected Discharge Date: Expected Discharge Date: 06/26/20  Team Members Present: Physician leading conference: Dr. Alysia Penna Care Coodinator Present: Dorien Chihuahua, RN, BSN, CRRN;Christina Sampson Goon, [place] Nurse Present: Isla Pence, RN PT Present: Page Spiro, PT OT Present: Clyda Greener, OT SLP Present: Jettie Booze, CF-SLP PPS Coordinator present : Gunnar Fusi, Novella Olive, PT     Current Status/Progress Goal Weekly Team Focus  Bowel/Bladder   Pt is continent/incontinent x2.  Pt will regain continence.  Assess q shift and prn.   Swallow/Nutrition/ Hydration             ADL's   Min assist for UB bathing with min to mod for UB dressing.  Mod assist for LB dressing with min assist for transfers using the RW for support.  She uses the RUE at a gross assist level with selfcare tasks.  min assist overall  selfcare retraining, balance retraining, transfer training, neuromuscular re-education, AE/DME education, pt/family education   Mobility   min/mod assist bed mobility, min assist sit<>stand and stand pivot transfers using RW, min assist gait training up to 78f using RW, planning for power wheelchair consultation due to progressive nature of MS diagnosis and impaired functional mobility level  CGA overall  activity tolerance, bed mobility, transfer training, gait training, pt/family education, standing balance and endurance, power  wheelchair mobility   Communication             Safety/Cognition/ Behavioral Observations  Supervision A semi-complex, extra time required for processing  Mod I  complex problem solving, recall with aids/strategies, selective attention   Pain   Pt does not report pain at this time.  Pain will remain <3.  Assess q shift and prn.   Skin   Skin is intact and free from infection.  Skin will remain intact and free from infection.  Assess q shift and prn.     Discharge Planning:  Discharging home with daughters to provide 24/7   Team Discussion: Chronic right spastic hemiparesis, MS complicated by right pons stroke. Note diplopia and decreased right lat gaze improved. HTN controlled, CKD monitored per MD and adjusting meds for labile CBGs. Continue HS fluids. Discussion of recommendation for power chair for discharge and AFO; although non functional gait anticipated for discharge. Using right upper extremity for functional use however note trouble with fine motor movement and synergy. Progress limited by fatigue , and tone. Patient on target to meet rehab goals: yes, currently min assist for upper body bathing and supervision for upper body dressing min assist for lower body bathing and dressing. Transfers min assist for hygiene.  *See Care Plan and progress notes for long and short-term goals.   Revisions to Treatment Plan:   Teaching Needs: Transfers, toileting, medications, etc.   Current Barriers to Discharge: Decreased caregiver support, Home enviroment access/layout and Incontinence  Possible Resolutions to Barriers: Family education    Medical Summary Current Status: History of MS, prior  CVA causing right hemiparesis, most recent CVA right pontine with diplopia and decreased balance, blood pressures with some mild lability, positive constipation  Barriers to Discharge: Medical stability   Possible Resolutions to [employer] Focus: Adjust bowel program, improved bladder  continence, trial of mobility equipment, power chair   Continued Need for Acute Rehabilitation Level of Care: The patient requires daily medical management by a physician with specialized training in physical medicine and rehabilitation for the following reasons: Direction of a multidisciplinary physical rehabilitation program to maximize functional independence : Yes Medical management of patient stability for increased activity during participation in an intensive rehabilitation regime.: Yes Analysis of laboratory values and/or radiology reports with any subsequent need for medication adjustment and/or medical intervention. : Yes   I attest that I was present, lead the team conference, and concur with the assessment and plan of the team.   Dorien Chihuahua B 06/18/2020, 2:42 PM

## 2020-06-18 NOTE — Progress Notes (Signed)
[place] PHYSICAL MEDICINE & REHABILITATION PROGRESS NOTE   Subjective/Complaints:  Per OT, double vision has resolved , trial of power chair to improve functional independence has chronic balance issues due to MS with superimposed RIght hemiparesis  ROS- neg CP, SOB, N/V/D Objective:   No results found. No results for input(s): WBC, HGB, HCT, PLT in the last 72 hours. Recent Labs    06/17/20 0454  NA 137  K 4.4  CL 106  CO2 23  GLUCOSE 147*  BUN 36*  CREATININE 4.42*  CALCIUM 8.5*    Intake/Output Summary (Last 24 hours) at 06/18/2020 0850 Last data filed at 06/18/2020 0700 Gross per 24 hour  Intake 758.8 ml  Output --  Net 758.8 ml        Physical Exam: Vital Signs Blood pressure (!) 142/71, pulse 80, temperature 98.7 F (37.1 C), temperature source Oral, resp. rate 18, height '5\' 2"'  (1.575 m), weight 65.4 kg, SpO2 98 %.   General: No acute distress Mood and affect are appropriate Heart: Regular rate and rhythm no rubs murmurs or extra sounds Lungs: Clear to auscultation, breathing unlabored, no rales or wheezes Abdomen: Positive bowel sounds, soft nontender to palpation, nondistended Extremities: No clubbing, cyanosis, or edema Skin: No evidence of breakdown, no evidence of rash  Neurologic: Cranial nerves II through XII intact, motor strength is 5/5 in left 3- RIght  deltoid, bicep, tricep, grip, hip flexor, knee extensors, ankle dorsiflexor and plantar flexor Sensory exam normal sensation to light touch and proprioception in bilateral upper and lower extremities Cerebellar exam normal finger to nose to finger as well as heel to shin in bilateral upper and lower extremities Musculoskeletal: Full range of motion in all 4 extremities. No joint swelling   Assessment/Plan: 1. Functional deficits which require 3+ hours per day of interdisciplinary therapy in a comprehensive inpatient rehab setting.  Physiatrist is providing close team supervision and 24 hour  management of active medical problems listed below.  Physiatrist and rehab team continue to assess barriers to discharge/monitor patient progress toward functional and medical goals  Care Tool:  Bathing    Body parts bathed by patient: Right arm,Chest,Abdomen,Right upper leg,Left upper leg,Face,Front perineal area,Buttocks,Left lower leg,Left arm   Body parts bathed by helper: Right lower leg     Bathing assist Assist Level: Minimal Assistance - Patient > 75%     Upper Body Dressing/Undressing Upper body dressing   What is the patient wearing?: Hospital gown only    Upper body assist Assist Level: Minimal Assistance - Patient > 75%    Lower Body Dressing/Undressing Lower body dressing      What is the patient wearing?: Hospital gown only     Lower body assist Assist for lower body dressing: Moderate Assistance - Patient 50 - 74%     Toileting Toileting    Toileting assist Assist for toileting: Moderate Assistance - Patient 50 - 74%     Transfers Chair/bed transfer  Transfers assist     Chair/bed transfer assist level: Minimal Assistance - Patient > 75% Chair/bed transfer assistive device: Programmer, multimedia   Ambulation assist      Assist level: Minimal Assistance - Patient > 75% Assistive device: Walker-rolling Max distance: 12'   Walk 10 feet activity   Assist  Walk 10 feet activity did not occur: Safety/medical concerns  Assist level: Minimal Assistance - Patient > 75% Assistive device: Walker-rolling   Walk 50 feet activity   Assist Walk 50 feet with 2  turns activity did not occur: Safety/medical concerns         Walk 150 feet activity   Assist Walk 150 feet activity did not occur: Safety/medical concerns         Walk 10 feet on uneven surface  activity   Assist Walk 10 feet on uneven surfaces activity did not occur: Safety/medical concerns         Wheelchair     Assist Will patient use wheelchair at  discharge?: Yes Type of Wheelchair: Power    Wheelchair assist level: Moderate Assistance - Patient 50 - 74% Max wheelchair distance: 174f    Wheelchair 50 feet with 2 turns activity    Assist        Assist Level: Minimal Assistance - Patient > 75%   Wheelchair 150 feet activity     Assist      Assist Level: Minimal Assistance - Patient > 75%   Blood pressure (!) 142/71, pulse 80, temperature 98.7 F (37.1 C), temperature source Oral, resp. rate 18, height '5\' 2"'  (1.575 m), weight 65.4 kg, SpO2 98 %.   Medical Problem List and Plan: 1.Visual changes and functional deficits secondary to acute nonhemorrhagic dorsal right pontine infarct as well as history of multiple CVA in the past with right-sided spastic weakness and slurred speech. ?MS lesions also -patient may shower -ELOS/Goals: 15-21 days, supervision to min assist with PT, OT, SLP  Continue CIR , Team conference today please see physician documentation under team conference tab, met with team  to discuss problems,progress, and goals. Formulized individual treatment plan based on medical history, underlying problem and comorbidities.  2. Antithrombotics: -DVT/anticoagulation:Subcutaneous heparin -antiplatelet therapy: Aspirin 81 mg daily and Plavix 75 mg daily 3. Pain Management:Tylenol as needed 4. Mood:Continue Prozac 20 mg daily -antipsychotic agents: N/A 5. Neuropsych: This patientiscapable of making decisions on herown behalf. 6. Skin/Wound Care:Routine skin checks 7. Fluids/Electrolytes/Nutrition:Routine in and outs with follow-up chemistries 8. Hypertension. Continue Norvasc 10 mg daily, Lopressor 100 mg twice daily. Monitor with increased mobility Vitals:   06/17/20 1922 06/18/20 0412  BP: 139/68 (!) 142/71  Pulse: 84 80  Resp: 18 18  Temp: 98.6 F (37 C) 98.7 F (37.1 C)  SpO2: 99% 98%  controlled , no orthostatic sx 9. Diabetes  mellitus. Hemoglobin A1c 8.9. Lantus insulin 35 units nightly. Check blood sugars before meals and at bedtime.   CBG (last 3)  Recent Labs    06/17/20 1642 06/17/20 2125 06/18/20 0615  GLUCAP 204* 110* 91  2/1 am CBG ok, cont  Lantus 19U  10. CKD stage IV. Latest baseline creatinine from February 2021 was 2.81. Cr 3.98 on 1/28, repeat tomorrow given upward trend.  36/4.42 on 2/1, poor fluid intake, start IVF at noc, recheck BMET end of wk   11.? Multiple sclerosis. Self-reported. Multiple new lesions on MRI c/w with diagnosis but pt apparently never has been treated.  -pt to f/u with Dr. SLeonie Manas outpt to further explore.  12. GERD. Pepcid 13. Hyperlipidemia. Crestor 14.  Anemia will guaic stools which are still pending and recheck Hgb is stable at 7.7 on 1/29 check ortho vitals if symptomatic consider transfusion    LOS: 6 days A FACE TO F[place]outE Kazimir Hartnett 06/18/2020, 8:50 AM

## 2020-06-19 LAB — CBC
HCT: 27.8 % — ABNORMAL LOW (ref 36.0–46.0)
Hemoglobin: 8.8 g/dL — ABNORMAL LOW (ref 12.0–15.0)
MCH: 28.1 pg (ref 26.0–34.0)
MCHC: 31.7 g/dL (ref 30.0–36.0)
MCV: 88.8 fL (ref 80.0–100.0)
Platelets: 233 10*3/uL (ref 150–400)
RBC: 3.13 MIL/uL — ABNORMAL LOW (ref 3.87–5.11)
RDW: 13.4 % (ref 11.5–15.5)
WBC: 7.7 10*3/uL (ref 4.0–10.5)
nRBC: 0 % (ref 0.0–0.2)

## 2020-06-19 LAB — GLUCOSE, CAPILLARY
Glucose-Capillary: 130 mg/dL — ABNORMAL HIGH (ref 70–99)
Glucose-Capillary: 156 mg/dL — ABNORMAL HIGH (ref 70–99)
Glucose-Capillary: 162 mg/dL — ABNORMAL HIGH (ref 70–99)
Glucose-Capillary: 266 mg/dL — ABNORMAL HIGH (ref 70–99)
Glucose-Capillary: 48 mg/dL — ABNORMAL LOW (ref 70–99)
Glucose-Capillary: 50 mg/dL — ABNORMAL LOW (ref 70–99)
Glucose-Capillary: 81 mg/dL (ref 70–99)
Glucose-Capillary: 90 mg/dL (ref 70–99)

## 2020-06-19 MED ORDER — INSULIN GLARGINE 100 UNIT/ML ~~LOC~~ SOLN
15.0000 [IU] | Freq: Every day | SUBCUTANEOUS | Status: DC
  Administered 2020-06-19 – 2020-06-23 (×5): 15 [IU] via SUBCUTANEOUS
  Filled 2020-06-19 (×7): qty 0.15

## 2020-06-19 NOTE — Progress Notes (Signed)
Hypoglycemic Event  CBG: 48  Treatment: 8 oz juice/soda  Symptoms: Shaky  Follow-up CBG: [id number] CBG Result:90  Possible Reasons for Event: Medication regimen: Lantus  Comments/MD notified:  Upon waking patient she doesn't feel good and is shaky. Blood glucose check was 48. Hypoglycemic protocol followed (8 oz soda given). Recheck blood glucose is 90. Pt is now asymptomatic and states " I feel much better, thank you!"  Dan Angiulli PA-C to be notified.     Jacquelynn Cree Taber Sweetser

## 2020-06-19 NOTE — Progress Notes (Signed)
Physical Therapy Weekly Progress Note  Patient Details  Name: Hannah [name] MRN: [id number] Date of Birth: [birth year]  Beginning of progress report period: June 13, 2020 End of progress report period: June 19, 2020  Patient has met 3 of 3 short term goals. Ms. Torelli is progressing well with therapy demonstrating increasing independence with functional mobility. She is performing bed mobility min assist, sit<>stand and stand pivot transfers using RW with min assist, ambulating up to 127f using RW with skilled min assist, and ascending/descending 4 steps using B HRs with skilled min assist. She continues to demonstrate R hemiparesis with R LE hypertonia and impaired motor coordination impacting gait mechanics. Due to pt's significant MS related fatigue, decreased functional mobility, R hemiparesis, and high energy demands of mobility tasks along with progressive MS diagnosis therapist has recommended power wheelchair consultation to allow patient independent functional mobility in the home and community.  Patient continues to demonstrate the following deficits muscle weakness and muscle joint tightness, decreased cardiorespiratoy endurance, impaired timing and sequencing, abnormal tone, unbalanced muscle activation, motor apraxia and decreased coordination, decreased visual perceptual skills and decreased visual motor skills, decreased attention to right, decreased problem solving and decreased memory and decreased sitting balance, decreased standing balance, decreased postural control, hemiplegia and decreased balance strategies and therefore will continue to benefit from skilled PT intervention to increase functional independence with mobility.  Patient progressing toward long term goals.  Continue plan of care.  PT Short Term Goals Week 1:  PT Short Term Goal 1 (Week 1): Patient will complete bed mobility with MinA consistently PT Short Term Goal 1 - Progress (Week 1): Met PT Short Term Goal  2 (Week 1): Patient will transfers bed<> wc with LRAD and MinA consistently PT Short Term Goal 2 - Progress (Week 1): Met PT Short Term Goal 3 (Week 1): Patient will ambulate >136fwith LRAD and ModA, 2nd person for wc follow if needed PT Short Term Goal 3 - Progress (Week 1): Met Week 2:  PT Short Term Goal 1 (Week 2): = to LTGs based on ELOS  Skilled Therapeutic Interventions/Progress Updates:  Ambulation/gait training;Discharge planning;Functional mobility training;Psychosocial support;Therapeutic Activities;Visual/perceptual remediation/compensation;Balance/vestibular training;Disease management/prevention;Neuromuscular re-education;Skin care/wound management;Therapeutic Exercise;Wheelchair propulsion/positioning;Cognitive remediation/compensation;DME/adaptive equipment instruction;Pain management;Splinting/orthotics;UE/LE Strength taining/ROM;Community reintegration;Functional electrical stimulation;Patient/family education;Stair training;UE/LE Coordination activities   Therapy Documentation Precautions:  Precautions Precautions: Fall Precaution Comments: R spastic hemiparesis in UE/LE Restrictions Weight Bearing Restrictions: No   CaTawana Scale PT, DPT, CSRS  06/19/2020, 6:06 PM

## 2020-06-19 NOTE — Progress Notes (Deleted)
Patient screams out in painwhen repositioning or generali ADL's are provided refused medication after several attempt to providestates I dont want antthing for pain

## 2020-06-19 NOTE — Plan of Care (Signed)
  Problem: Sit to Stand Goal: LTG:  Patient will perform sit to stand with assistance level (PT) Description: LTG:  Patient will perform sit to stand with assistance level (PT) Flowsheets (Taken 06/19/2020 1824) LTG: PT will perform sit to stand in preparation for functional mobility with assistance level: (upgraded based on pt progress and ELOS) Supervision/Verbal cueing Note: upgraded based on pt progress and ELOS   Problem: RH Wheelchair Mobility Goal: LTG Patient will propel w/c in controlled environment (PT) Description: LTG: Patient will propel wheelchair in controlled environment, # of feet with assist (PT) Flowsheets (Taken 06/19/2020 1824) LTG: Pt will propel w/c in controlled environ  assist needed:: (upgraded based on pt progress and ELOS) Supervision/Verbal cueing LTG: Propel w/c distance in controlled environment: 177f using power wheelchair Note: upgraded based on pt progress and ELOS Goal: LTG Patient will propel w/c in home environment (PT) Description: LTG: Patient will propel wheelchair in home environment, # of feet with assistance (PT). Flowsheets (Taken 06/19/2020 1824) LTG: Pt will propel w/c in home environ  assist needed:: (upgraded based on pt progress and ELOS) Supervision/Verbal cueing LTG: Propel w/c distance in home environment: 5101fin power wheelchair Note: upgraded based on pt progress and ELOS

## 2020-06-19 NOTE — Plan of Care (Signed)
  Problem: Sit to Stand Goal: LTG:  Patient will perform sit to stand in prep for activites of daily living with assistance level (OT) Description: LTG:  Patient will perform sit to stand in prep for activites of daily living with assistance level (OT) Flowsheets (Taken 06/19/2020 1237) LTG: PT will perform sit to stand in prep for activites of daily living with assistance level: (Goal upgraded based on progress) Supervision/Verbal cueing   Problem: RH Bathing Goal: LTG Patient will bathe all body parts with assist levels (OT) Description: LTG: Patient will bathe all body parts with assist levels (OT) Flowsheets (Taken 06/19/2020 1237) LTG: Pt will perform bathing with assistance level/cueing: (goal upgraded based on progress) Supervision/Verbal cueing LTG: Position pt will perform bathing: Shower   Problem: RH Dressing Goal: LTG Patient will perform upper body dressing (OT) Description: LTG Patient will perform upper body dressing with assist, with/without cues (OT). Flowsheets (Taken 06/19/2020 1237) LTG: Pt will perform upper body dressing with assistance level of: (Goal upgraded based on progress) Supervision/Verbal cueing   Problem: RH Toileting Goal: LTG Patient will perform toileting task (3/3 steps) with assistance level (OT) Description: LTG: Patient will perform toileting task (3/3 steps) with assistance level (OT)  Flowsheets (Taken 06/19/2020 1237) LTG: Pt will perform toileting task (3/3 steps) with assistance level: (Goal upgraded based on progress) Contact Guard/Touching assist   Problem: RH Toilet Transfers Goal: LTG Patient will perform toilet transfers w/assist (OT) Description: LTG: Patient will perform toilet transfers with assist, with/without cues using equipment (OT) Flowsheets (Taken 06/19/2020 1237) LTG: Pt will perform toilet transfers with assistance level of: (goal upgraded based on progress) Contact Guard/Touching assist   Problem: RH Tub/Shower Transfers Goal: LTG  Patient will perform tub/shower transfers w/assist (OT) Description: LTG: Patient will perform tub/shower transfers with assist, with/without cues using equipment (OT) Flowsheets (Taken 06/19/2020 1237) LTG: Pt will perform tub/shower stall transfers with assistance level of: (goals upgraded based on progress) Contact Guard/Touching assist LTG: Pt will perform tub/shower transfers from: Tub/shower combination

## 2020-06-19 NOTE — Progress Notes (Signed)
Occupational Therapy Weekly Progress Note  Patient Details  Name: Hannah [name] MRN: [id number] Date of Birth: [birth year]  Beginning of progress report period: June 13, 2020 End of progress report period: June 19, 2020  Today's Date: 06/19/2020 OT Individual Time: 5573-2202 OT Individual Time Calculation (min): 58 min    Patient has met 3 of 4 short term goals.  Hannah [name] is making steady progress with OT at this time.  She currently completes UB bathing and dressing at supervision level.  LB bathing is at min assist sit to stand with LB dressing at mod assist including TEDs and slip on shoes.  She is able to complete donning her underpants and pants at min assist level but needs more assist with TEDs and her shoes.  Transfers are currently min assist level as well with use of the RW for support.  She demonstrates hemiparesis in the RUE and RLE as well with decreased ability to advance the RLE efficiently secondary to paralysis and increased tone.  She is able to use the RUE at a diminished level for bathing and dressing tasks with Brunnstrum stage IV noted in the arm and stage V in the hand.  Min assist is needed at times for pulling up her underpants and pants on the right side secondary to weakness.  He vision had continued to improve and diplopia has resolved with near vision, but she does report it occasionally when watching TV.  Will continue to look at further.  She is making great progress and current min assist goals will be upgraded to reflect this.  Recommend continued CIR level therapy in anticipation of discharge on 2/10.    Patient continues to demonstrate the following deficits: muscle weakness, muscle joint tightness and muscle paralysis, impaired timing and sequencing, abnormal tone and decreased coordination, and decreased standing balance, hemiplegia and decreased balance strategies and therefore will continue to benefit from skilled OT intervention to enhance overall  performance with BADL and Reduce care partner burden.  Patient progressing toward long term goals..  Continue plan of care.  OT Short Term Goals Week 2:  OT Short Term Goal 1 (Week 2): Continue working on established LTGs set at supervision to min guard assist overall.  Skilled Therapeutic Interventions/Progress Updates:    Ms. Rising worked on showering and dressing during session.  She was able to complete transfer from supine to sit EOB with min assist and then ambulate to the walk-in shower at the same level with use of the RW.  Increased tone noted in the RLE with pt unable to complete heel strike or achieve full right knee extension.  She was able to removed clothing for shower with min assist.  She then completed all UB bathing with supervision.  LB bathing was performed with min guard sit to stand.  She was then able to transfer to the wheelchair in order to complete dressing tasks.  She was able to donn UB clothing with supervision.  She donned her underpants and pants with min assist as well.  Total assist was needed for donning TEDs with mod assist for slip on shoes.  She finished session with transfer from the wheelchair to the power chair with min assist using the RW.  She then worked on grooming tasks at the sink with her daughter supervision and assisting with her hair.    Therapy Documentation Precautions:  Precautions Precautions: Fall Precaution Comments: R spastic hemiparesis in UE/LE Restrictions Weight Bearing Restrictions: No  Pain: Pain Assessment Pain Scale:  Faces Pain Score: 0-No pain ADL: See Care Tool Section for some details of mobility and selfcare  Therapy/Group: Individual Therapy  Giordano Getman OTR/L 06/19/2020, 12:19 PM

## 2020-06-19 NOTE — Progress Notes (Signed)
Physical Therapy Session Note  Patient Details  Name: Hannah [name] MRN: [id number] Date of Birth: 01/02/1966  Today's Date: 06/19/2020 PT Individual Time: 1102-1205 and 1650-1730 PT Individual Time Calculation (min): 63 min and 40 min  Short Term Goals: Week 1:  PT Short Term Goal 1 (Week 1): Patient will complete bed mobility with MinA consistently PT Short Term Goal 2 (Week 1): Patient will transfers bed<> wc with LRAD and MinA consistently PT Short Term Goal 3 (Week 1): Patient will ambulate >75f with LRAD and ModA, 2nd person for wc follow if needed  Skilled Therapeutic Interventions/Progress Updates:    Session 1: Pt received sitting in power w/c with her daughter present and pt eager to participate in therapy session. Discussed plan for power wheelchair consultation today and pt/family in agreement. Pt noted to be slurring her words more and reporting some fatigue - daughter reports that when pt is fatigued her speech can become slurred and that this is not abnormal. Sit>stand power w/c>RW with CGA for steadying and initiated gait training with pt suddenly reporting she feels that her blood sugar is low - stepped back and sat down in w/c with CGA for steadying and cuing for sequencing. NT present to assess with BS 50 - provided pt with OJ and granola bar - pt reports she didn't eat breakfast because it did not taste good. Brandon, [employer] arriving for power wheelchair consultation and discussed the following:  - pt not being a functional ambulator due to MS related fatigue and R hemiparesis with hypertonia - pt unable to propel manual wheelchair due to the same reasons  - pt needing a power wheelchair with the following features in order to allow independent mobility in the home and community:  - tilt feature for independent pressure relief  - elevating leg rests for edema management  - joy stick handle  - front wheel drive chair due to pt having grass access into the home and needing closer  access to counters to complete ADL tasks  - elevating feature to allow access to counters to complete ADLs  Discussed family needing to have an SUV rated to tow 500lb if they wanted to purchase a carrier to place on the back of their vehicle to transport power wheelchair. Pt in agreement with the discussed plan. Left sitting in power wheelchair with seat belt on, needs in reach, and her daughter present.  Session 2: Pt received sitting in power w/c with her daughter present and pt eager to participate in therapy session. Performed power w/c navigation on "slow" setting out of room with min cuing to navigate sink on R side with close supervision for safety - navigated remainder of distance ~1355fto main therapy gym, same setting, with supervision and min cuing to navigate through doorways. Sit<>stands to/from power w/c using RW with CGA for steadying throughout session. Gait training 12579fsing RW with min assist for balance - continued cuing for increased R knee flexion during swing phase and heel strike on initial contact - pt continues to demo hypertonia with slow movement and decreased coordination though improving - pt reporting she feels less resistance when bringing her leg forward today. Continue to provide cuing for sequencing of AD and LE stepping when turning to sit in chair as pt lets RW move too far forward causing increased instability. R LE NMR in // bars with B UE support via stepping over yoga block to/from external targets wearing 4lb ankle weight focusing on increased hamstring activation x6 reps  with pt's muscles become fatigued likely related to MS. Power w/c navigation back to room with close supervision and then hand-over-hand assistance to back up w/c next to the bed as pt still has difficulty controlling backwards navigation of chair. Pt left seated in power w/c with needs in reach, seat belt alarm on, her daughter present, and meal tray set-up.    Therapy Documentation Precautions:   Precautions Precautions: Fall Precaution Comments: R spastic hemiparesis in UE/LE Restrictions Weight Bearing Restrictions: No  Pain:   Session 1: No reports of pain throughout session.  Session 2: No reports of pain throughout session.  Therapy/Group: Individual Therapy  Tawana Scale , PT, DPT, CSRS  06/19/2020, 7:55 AM

## 2020-06-19 NOTE — Progress Notes (Signed)
[place] PHYSICAL MEDICINE & REHABILITATION PROGRESS NOTE   Subjective/Complaints:  Low CBG this am , pt did not have snack last noc  Discussed RLE shaking mainly at noc , discussed new med for spasticity   ROS- neg CP, SOB, N/V/D Objective:   No results found. No results for input(s): WBC, HGB, HCT, PLT in the last 72 hours. Recent Labs    06/17/20 0454  NA 137  K 4.4  CL 106  CO2 23  GLUCOSE 147*  BUN 36*  CREATININE 4.42*  CALCIUM 8.5*    Intake/Output Summary (Last 24 hours) at 06/19/2020 0914 Last data filed at 06/19/2020 0612 Gross per 24 hour  Intake 1645.5 ml  Output -  Net 1645.5 ml        Physical Exam: Vital Signs Blood pressure (!) 144/68, pulse 79, temperature 99 F (37.2 C), temperature source Oral, resp. rate 18, height '5\' 2"'$  (1.575 m), weight 65.4 kg, SpO2 100 %.    General: No acute distress Mood and affect are appropriate Heart: Regular rate and rhythm no rubs murmurs or extra sounds Lungs: Clear to auscultation, breathing unlabored, no rales or wheezes Abdomen: Positive bowel sounds, soft nontender to palpation, nondistended Extremities: No clubbing, cyanosis, or edema Skin: No evidence of breakdown, no evidence of rash  Neurologic: Cranial nerves II through XII intact, motor strength is 5/5 in left 3- RIght  deltoid, bicep, tricep, grip, hip flexor, knee extensors, ankle dorsiflexor and plantar flexor Sensory exam normal sensation to light touch and proprioception in bilateral upper and lower extremities Cerebellar exam normal finger to nose to finger as well as heel to shin in bilateral upper and lower extremities Musculoskeletal: Full range of motion in all 4 extremities. No joint swelling   Assessment/Plan: 1. Functional deficits which require 3+ hours per day of interdisciplinary therapy in a comprehensive inpatient rehab setting.  Physiatrist is providing close team supervision and 24 hour management of active medical problems  listed below.  Physiatrist and rehab team continue to assess barriers to discharge/monitor patient progress toward functional and medical goals  Care Tool:  Bathing    Body parts bathed by patient: Right arm,Chest,Abdomen,Right upper leg,Left upper leg,Face,Front perineal area,Buttocks,Left lower leg,Left arm   Body parts bathed by helper: Right lower leg     Bathing assist Assist Level: Minimal Assistance - Patient > 75%     Upper Body Dressing/Undressing Upper body dressing   What is the patient wearing?: Pull over shirt,Bra    Upper body assist Assist Level: Supervision/Verbal cueing    Lower Body Dressing/Undressing Lower body dressing      What is the patient wearing?: Underwear/pull up,Pants     Lower body assist Assist for lower body dressing: Minimal Assistance - Patient > 75%     Toileting Toileting    Toileting assist Assist for toileting: Moderate Assistance - Patient 50 - 74%     Transfers Chair/bed transfer  Transfers assist     Chair/bed transfer assist level: Minimal Assistance - Patient > 75% Chair/bed transfer assistive device: Walker,Armrests   Locomotion Ambulation   Ambulation assist      Assist level: Minimal Assistance - Patient > 75% Assistive device: Walker-rolling Max distance: 69f   Walk 10 feet activity   Assist  Walk 10 feet activity did not occur: Safety/medical concerns  Assist level: Minimal Assistance - Patient > 75% Assistive device: Walker-rolling   Walk 50 feet activity   Assist Walk 50 feet with 2 turns activity did not occur:  Safety/medical concerns  Assist level: Minimal Assistance - Patient > 75% Assistive device: Walker-rolling    Walk 150 feet activity   Assist Walk 150 feet activity did not occur: Safety/medical concerns         Walk 10 feet on uneven surface  activity   Assist Walk 10 feet on uneven surfaces activity did not occur: Safety/medical concerns          Wheelchair     Assist Will patient use wheelchair at discharge?: Yes Type of Wheelchair: Power    Wheelchair assist level: Moderate Assistance - Patient 50 - 74% Max wheelchair distance: 100'    Wheelchair 50 feet with 2 turns activity    Assist        Assist Level: Minimal Assistance - Patient > 75%   Wheelchair 150 feet activity     Assist      Assist Level: Minimal Assistance - Patient > 75%   Blood pressure (!) 144/68, pulse 79, temperature 99 F (37.2 C), temperature source Oral, resp. rate 18, height '5\' 2"'$  (1.575 m), weight 65.4 kg, SpO2 100 %.   Medical Problem List and Plan: 1.Visual changes and functional deficits secondary to acute nonhemorrhagic dorsal right pontine infarct as well as history of multiple CVA in the past with right-sided spastic weakness and slurred speech. ?MS lesions also -patient may shower -ELOS/Goals:,06/26/2020/ supervision to min assist with PT, OT, SLP  Continue CIR , PT, OT  Cont tizanidine  2. Antithrombotics: -DVT/anticoagulation:Subcutaneous heparin -antiplatelet therapy: Aspirin 81 mg daily and Plavix 75 mg daily 3. Pain Management:Tylenol as needed 4. Mood:Continue Prozac 20 mg daily -antipsychotic agents: N/A 5. Neuropsych: This patientiscapable of making decisions on herown behalf. 6. Skin/Wound Care:Routine skin checks 7. Fluids/Electrolytes/Nutrition:Routine in and outs with follow-up chemistries 8. Hypertension. Continue Norvasc 10 mg daily, Lopressor 100 mg twice daily. Monitor with increased mobility Vitals:   06/18/20 1944 06/19/20 0424  BP: (!) 154/73 (!) 144/68  Pulse: 79 79  Resp: 19 18  Temp: 98.6 F (37 C) 99 F (37.2 C)  SpO2: 98% 100%  controlled , no orthostatic sx 9. Diabetes mellitus. Hemoglobin A1c 8.9. Lantus insulin 35 units nightly. Check blood sugars before meals and at bedtime.   CBG (last 3)  Recent Labs     06/19/20 0434 06/19/20 0454 06/19/20 0616  GLUCAP 48* 90 130*  2/3 am CBG low reduce  Lantus 15U- reinforced need for snack qhs   10. CKD stage IV. Latest baseline creatinine from February 2021 was 2.81. Cr 3.98 on 1/28, repeat tomorrow given upward trend.  36/4.42 on 2/1, poor fluid intake, start IVF at noc, recheck BMET end of wk   11.? Multiple sclerosis. Self-reported. Multiple new lesions on MRI c/w with diagnosis but pt apparently never has been treated.  -pt to f/u with Dr. Leonie Man as outpt to further explore.  12. GERD. Pepcid 13. Hyperlipidemia. Crestor 14.  Anemia will guaic stools which are still pending and recheck Hgb is stable at 7.7 on 1/29  Recheck CBC check ortho vitals if symptomatic consider transfusion    LOS: 7 days A FACE TO [place] E Denna Fryberger 06/19/2020, 9:14 AM

## 2020-06-19 NOTE — Evaluation (Signed)
Recreational Therapy Assessment and Plan  Patient Details  Name: Hannah [name] MRN: [id number] Date of Birth: 30-Nov-1965 Today's Date: 06/19/2020  Rehab Potential:   ELOS:     Assessment    Hospital Problem: Principal Problem:   Right pontine cerebrovascular accident [facility])   Past Medical History:      Past Medical History:  Diagnosis Date  . Cerebral infarction ([place]) 07/12/2017   2009 first one, has had a total of 4 stokes  . Diabetes type 2, controlled ([place])   . Hemiparesis affecting right side as late effect of cerebrovascular accident (CVA) ([place])   . Hyperlipidemia   . Hypertension   . Multiple sclerosis ([place]) 2009  . Stroke [facility])    Past Surgical History:       Past Surgical History:  Procedure Laterality Date  . APPENDECTOMY    . CYSTOSTOMY W/ BLADDER BIOPSY    . OVARIAN CYST SURGERY    . stent placed in heart      Assessment & Plan Clinical Impression: Patient is a 55 y.o. year old female with history of hyperlipidemia, CKD stage IV, multiple sclerosis, diabetes mellitus, hypertension, multiple CVA with right side residual weakness and slurred speech maintained on aspirin and Plavix. Patient reportedly has missed multiple medical follow-up appointments. Per chart review patient lives with her family. She has assistance as needed. Used a Rollator prior to admission. Family assist with self-care. Presented 06/09/2020 with dizziness and vision change as well as left-sided weakness. Blood pressure 208/102. Cranial CT scan negative for acute changes. MRI showed punctate focus of restricted diffusion within the dorsal pons on the right side. Scattered and confluent foci of T2 hyperintensity also seen within the white matter of the cerebral hemispheres and pons, nonspecific consistent with clinical diagnosis of multiple sclerosis with multiple new lesions. There is question whether these lesions are truly MS however.MRA unremarkable. Admission  chemistries glucose 161 BUN 39 creatinine 4.11 with noted creatinine from February 2021 2.81, alcohol negative. Patient did not receive TPA. Currently maintained on aspirin and Plavix for CVA prophylaxis. Subcutaneous Lovenox for DVT prophylaxis. Therapy evaluations completed with recommendations of physical medicine rehab consult due to left-sided weakness and facial droop with slurred speech. Patient was admitted for a comprehensive rehab program.  Pt presents with decreased activity tolerance, decreased functional mobility, decreased balance, decreased coordination, decreased vision, left inattention, decreased problem solving, decreased memory and delayed processing Limiting pt's independence with leisure/community pursuits.  Met with pt today to discuss leisure interests, activity analysis with potential adaptations and coping strategies.   Plan   Min 1 TR session per week during LOS Recommendations for other services: None   Discharge Criteria: Patient will be discharged from TR if patient refuses treatment 3 consecutive times without medical reason.  If treatment goals not met, if there is a change in medical status, if patient makes no progress towards goals or if patient is discharged from hospital.  The above assessment, treatment plan, treatment alternatives and goals were discussed and mutually agreed upon: by patient  [place] 06/19/2020, 8:48 AM

## 2020-06-19 NOTE — Progress Notes (Signed)
Glucose check at 0434 was 48. Patient was shaky and stated " I don't feel well". Hypoglycemic protocol followed. 8 oz of soda administered per protocol. Recheck glucose was 90. Pt was asymptomatic after glucose recheck. Pt stated " I feel better now, thank you".  Glucose check at 0616 is 130. Pt remains alert and oriented x 4 and is asymptomatic. Dan Angiulli PA-C notified. No new orders received.

## 2020-06-19 NOTE — Progress Notes (Signed)
Speech Language Pathology Daily Session Note  Patient Details  Name: Lakai Trevillion MRN: [id number] Date of Birth: 1965/07/27  Today's Date: 06/19/2020 SLP Individual Time: 1415-1500 SLP Individual Time Calculation (min): 45 min  Short Term Goals: Week 1: SLP Short Term Goal 1 (Week 1): Pt will utilize memory aids to facilitate recall of daily information with supervision verbal cues. SLP Short Term Goal 2 (Week 1): Pt will selectively attend to tasks in a mildly distracting environment with supervision verbal cues for redirection to task. SLP Short Term Goal 3 (Week 1): Pt will complete mildly complex tasks with supervision verbal cues for functional problem solving.  Skilled Therapeutic Interventions: Pt was seen for skilled ST targeting cognitive goals. SLP facilitated session with a complex medication management task. Pt with verbal recall of 5/9 current scheduled daily medication names, functions, and dosages without cues. Overall Supervision A verbal cues provided for recall of remaining medications. List making used as compensatory memory strategy to assist with future recall. Pt then organized a TID pill box according to her list of current medications with 95% accuracy - she required a Supervision A level verbal cue to detect 1 error, but able to correct without further cueing. She sustained her attention to tasks well without redirection. Pt left sitting in wheelchair with alarm set and needs within reach. Continue per current plan of care.       Pain Pain Assessment Pain Scale: 0-10 Pain Score: 0-No pain  Therapy/Group: Individual Therapy  Arbutus Leas 06/19/2020, 7:28 AM

## 2020-06-20 LAB — GLUCOSE, CAPILLARY
Glucose-Capillary: 105 mg/dL — ABNORMAL HIGH (ref 70–99)
Glucose-Capillary: 108 mg/dL — ABNORMAL HIGH (ref 70–99)
Glucose-Capillary: 144 mg/dL — ABNORMAL HIGH (ref 70–99)

## 2020-06-20 MED ORDER — BISACODYL 10 MG RE SUPP: 10.0000 mg | Freq: Every day | RECTAL | Status: DC | PRN

## 2020-06-20 NOTE — Progress Notes (Incomplete)
Patient resting throughout shift in no acute distress or discomfort. IVF 1/2 ns @ 75 cc/hr infusing w/o difficulty per orders. Patient denies discomfort or pain,

## 2020-06-20 NOTE — Progress Notes (Signed)
Recreational Therapy Session Note  Patient Details  Name: Hannah [name] MRN: [id number] Date of Birth: 04-28-1966 Today's Date: 06/20/2020  Pain: no c/o Skilled Therapeutic Interventions/Progress Updates: Discussion with pt and daughter about coping strategies.  Pt relies heavily on her faith to help her through stressful times.  Pt states she is adjusting well and motivated to regain further independence, whatever that looks like. Therapy/Group: Individual Therapy  Mykenna Viele 06/20/2020, 12:08 PM

## 2020-06-20 NOTE — Progress Notes (Signed)
Occupational Therapy Session Note  Patient Details  Name: Hannah [name] MRN: [id number] Date of Birth: 04-27-1966  Today's Date: 06/20/2020 OT Individual Time: 1006-1102 OT Individual Time Calculation (min): 56 min    Short Term Goals: Week 2:  OT Short Term Goal 1 (Week 2): Continue working on established LTGs set at supervision to min guard assist overall.  Skilled Therapeutic Interventions/Progress Updates:     Session 1: [id number]) Pt in bed to start session, completed transfer to the EOB on the right side with supervision with HOB elevated 25 degrees and with use of the rail.  She worked on donning her brief and pants at EOB with min assist sit to stand and then therapist assisted with donning TEDs and she donned her slip on shoes at min guard assist.  She next, completed transfer to the power wheelchair with min assist using the RW for support.  Then had her work on propelling the wheelchair down to the [employer].  She worked on use of [employer] system with emphasis on LUE reach and function.  She was able to complete Visual Pursuits program for two, 2 minute intervals.  Her accuracy was at 64% with average reaction time at 3.4 seconds using the entire board.  When board was centralized on the next set, she completed with 96% accuracy at an average of 2.1 seconds.  A third set was completed with RUE to further look at vision with an average time of 2.78 seconds.  No significant visual deficit noted during intervals however pt did take longer to locate and press the dots in the far lower left and right sides of the board.  Finished session with return to the room and pt positioning chair at bed side with mod assist.  Increased time needed to navigate wheelchair through the doorways of the ortho gym.  Pt was left with her daughter in room and call button and phone in reach.  Session 2: [phone])  Pt in power wheelchair to start, had her mobilize down to the therapy gym with supervision for  negotiating doorways and the wall on the right side of the hallway.  Once in the gym she transferred to the therapy mat with min assist stand pivot and then to supine at the same level.  Therapist completed stretching to the right shoulder internal rotators and shoulder extensors.  Educated pt on self stretching by clasping hands together and placing them behind her head to emphasize external rotation.  She also completed AAROM shoulder flexion for 1 set of 10 reps with hands clasped together.  She transitioned to sitting on the right side of the mat with emphasis on activation of right elbow extensors to transition to sitting with supervision.  Next, she worked on [employer] manipulation and reach by picking up poker chips beside her on the mat individually and placing them in a container at knee level progressing to shoulder level with the RUE.  Returned to the wheelchair at min assist with transition back to the room where she was left up in her power chair with call button and phone in reach.    Therapy Documentation Precautions:  Precautions Precautions: Fall Precaution Comments: R spastic hemiparesis in UE/LE Restrictions Weight Bearing Restrictions: No  Pain: Pain Assessment Pain Scale: Faces Pain Score: 0-No pain ADL: See Care Tool Section for some details of mobility and selfcare  Therapy/Group: Individual Therapy  Myli Pae OTR/L 06/20/2020, 12:23 PM

## 2020-06-20 NOTE — Progress Notes (Signed)
[place] PHYSICAL MEDICINE & REHABILITATION PROGRESS NOTE   Subjective/Complaints:  No issues today, in good spirits, reviewed lab, Hgb up ROS- neg CP, SOB, N/V/D Objective:   No results found. Recent Labs    06/19/20 1013  WBC 7.7  HGB 8.8*  HCT 27.8*  PLT 233   No results for input(s): NA, K, CL, CO2, GLUCOSE, BUN, CREATININE, CALCIUM in the last 72 hours.  Intake/Output Summary (Last 24 hours) at 06/20/2020 0848 Last data filed at 06/19/2020 2204 Gross per 24 hour  Intake 620 ml  Output -  Net 620 ml        Physical Exam: Vital Signs Blood pressure (!) 141/70, pulse 85, temperature 97.8 F (36.6 C), resp. rate 18, height '5\' 2"'$  (1.575 m), weight 65.4 kg, SpO2 100 %.     General: No acute distress Mood and affect are appropriate Heart: Regular rate and rhythm no rubs murmurs or extra sounds Lungs: Clear to auscultation, breathing unlabored, no rales or wheezes Abdomen: Positive bowel sounds, soft nontender to palpation, nondistended Extremities: No clubbing, cyanosis, or edema  Neurologic: Cranial nerves II through XII intact, motor strength is 5/5 in left 3- RIght  deltoid, bicep, tricep, grip, hip flexor, knee extensors, ankle dorsiflexor and plantar flexor  TOne MAS 3 in R Knee fleoxrs and ext, MAS 2 in R elbow flex, MAS1 in R finger and wrist flexors and ext Sensory exam normal sensation to light touch and proprioception in bilateral upper and lower extremities  Musculoskeletal: Pain-free range of motion in all 4 extremities. No joint swelling   Assessment/Plan: 1. Functional deficits which require 3+ hours per day of interdisciplinary therapy in a comprehensive inpatient rehab setting.  Physiatrist is providing close team supervision and 24 hour management of active medical problems listed below.  Physiatrist and rehab team continue to assess barriers to discharge/monitor patient progress toward functional and medical goals  Care Tool:  Bathing     Body parts bathed by patient: Right arm,Chest,Abdomen,Right upper leg,Left upper leg,Face,Front perineal area,Buttocks,Left lower leg,Left arm,Right lower leg   Body parts bathed by helper: Right lower leg     Bathing assist Assist Level: Contact Guard/Touching assist     Upper Body Dressing/Undressing Upper body dressing   What is the patient wearing?: Pull over shirt,Bra    Upper body assist Assist Level: Supervision/Verbal cueing    Lower Body Dressing/Undressing Lower body dressing      What is the patient wearing?: Underwear/pull up,Pants     Lower body assist Assist for lower body dressing: Minimal Assistance - Patient > 75%     Toileting Toileting    Toileting assist Assist for toileting: Moderate Assistance - Patient 50 - 74%     Transfers Chair/bed transfer  Transfers assist     Chair/bed transfer assist level: Minimal Assistance - Patient > 75% Chair/bed transfer assistive device: Walker,Armrests   Locomotion Ambulation   Ambulation assist      Assist level: Minimal Assistance - Patient > 75% Assistive device: Walker-rolling Max distance: 143f   Walk 10 feet activity   Assist  Walk 10 feet activity did not occur: Safety/medical concerns  Assist level: Minimal Assistance - Patient > 75% Assistive device: Walker-rolling   Walk 50 feet activity   Assist Walk 50 feet with 2 turns activity did not occur: Safety/medical concerns  Assist level: Minimal Assistance - Patient > 75% Assistive device: Walker-rolling    Walk 150 feet activity   Assist Walk 150 feet activity did not occur:  Safety/medical concerns         Walk 10 feet on uneven surface  activity   Assist Walk 10 feet on uneven surfaces activity did not occur: Safety/medical concerns         Wheelchair     Assist Will patient use wheelchair at discharge?: Yes Type of Wheelchair: Power    Wheelchair assist level: Minimal Assistance - Patient > 75%,Contact  Guard/Touching assist Max wheelchair distance: 146f    Wheelchair 50 feet with 2 turns activity    Assist        Assist Level: Contact Guard/Touching assist,Minimal Assistance - Patient > 75%   Wheelchair 150 feet activity     Assist      Assist Level: Contact Guard/Touching assist,Minimal Assistance - Patient > 75%   Blood pressure (!) 141/70, pulse 85, temperature 97.8 F (36.6 C), resp. rate 18, height '5\' 2"'$  (1.575 m), weight 65.4 kg, SpO2 100 %.   Medical Problem List and Plan: 1.Visual changes and functional deficits secondary to acute nonhemorrhagic dorsal right pontine infarct as well as history of multiple CVA in the past with right-sided spastic weakness and slurred speech. ?MS lesions also -patient may shower -ELOS/Goals:,06/26/2020/ supervision to min assist with PT, OT, SLP  Continue CIR , PT, OT  Cont tizanidine 3 times daily, may need to do Botox as an outpatient 2. Antithrombotics: -DVT/anticoagulation:Subcutaneous heparin -antiplatelet therapy: Aspirin 81 mg daily and Plavix 75 mg daily 3. Pain Management:Tylenol as needed 4. Mood:Continue Prozac 20 mg daily -antipsychotic agents: N/A 5. Neuropsych: This patientiscapable of making decisions on herown behalf. 6. Skin/Wound Care:Routine skin checks 7. Fluids/Electrolytes/Nutrition:Routine in and outs with follow-up chemistries 8. Hypertension. Continue Norvasc 10 mg daily, Lopressor 100 mg twice daily. Monitor with increased mobility Vitals:   06/19/20 2248 06/20/20 0527  BP: (!) 141/73 (!) 141/70  Pulse:  85  Resp:  18  Temp:  97.8 F (36.6 C)  SpO2:  100%  controlled , 2/4 9. Diabetes mellitus. Hemoglobin A1c 8.9. Lantus insulin 35 units nightly. Check blood sugars before meals and at bedtime.   CBG (last 3)  Recent Labs    06/19/20 1620 06/19/20 2101 06/20/20 0606  GLUCAP 162* 156* 105*  2/3 am CBG low reduce   Lantus 15U- reinforced need for snack qhs   10. CKD stage IV. Latest baseline creatinine from February 2021 was 2.81. Cr 3.98 on 1/28, repeat tomorrow given upward trend.  36/4.42 on 2/1, poor fluid intake, start IVF at noc, recheck BMET end of wk   11.? Multiple sclerosis. Self-reported. Multiple new lesions on MRI c/w with diagnosis but pt apparently never has been treated.  -pt to f/u with Dr. SLeonie Manas outpt to further explore.  12. GERD. Pepcid 13. Hyperlipidemia. Crestor 14.  Anemia will guaic stools which are still pending and recheck Hgb is stable at 7.7 on 1/29  Recheck CBC check ortho vitals if symptomatic consider transfusion    LOS: 8 days A FACE TO [facility] Mariah Harn 06/20/2020, 8:48 AM

## 2020-06-20 NOTE — Progress Notes (Signed)
Physical Therapy Session Note  Patient Details  Name: Hannah [name] MRN: [id number] Date of Birth: Dec 11, 1965  Today's Date: 06/20/2020 PT Individual Time: 1640-1735 PT Individual Time Calculation (min): 55 min   Short Term Goals: Week 2:  PT Short Term Goal 1 (Week 2): = to LTGs based on ELOS  Skilled Therapeutic Interventions/Progress Updates:   Pt received sitting in [facility] and agreeable to PT, and requesting to use restroom. Ambulatory transfer to toilet with RW and min assist from PT for safety and AD management over threshold into bathroom. Pt able to voild on toilet and perform peri care with set up assist. Min assist for transfer off toilet, and min assist for clothing management with UE support on RW. Ambulatory transfer back to [facility] with min assist as stated above.  PT instructed pt in power WC propulsion to perform navigation through simulated home environment to avoid furniture obstacle course in day room x 173f. Pt able to avoid 75% obstacles running in ralling table x 2 requiring assist from PT to correct trajectory of WC to navigate around table.   Dynamic gait training with RW to navigate simulated home environment of furniture obstacles x 1041f Min assist throughout with min cues for AD management in turns and improved step length on the RLE.      nustep reciprocal movement training and AAROM for HS and heelcord stretching x 6 min, level 4, with cues for full ROM to improve stretching. [facility] and supervision assist for safety. Pt returned to room and performed stand pivot transfer to bed with supervision assist. Sit>supine completed with min cues for LE coordination and supervision assist, and left supine in bed with call bell in reach and all needs met.        Therapy Documentation Precautions:  Precautions Precautions: Fall Precaution Comments: R spastic hemiparesis in UE/LE Restrictions Weight Bearing Restrictions: No Vital Signs: Therapy  Vitals Temp: 98.7 F (37.1 C) Pulse Rate: 79 Resp: 17 BP: 139/69 Patient Position (if appropriate): Lying Oxygen Therapy SpO2: 100 % O2 Device: Room Air Pain:   denies  Therapy/Group: Individual Therapy  AuLorie Phenix/08/2020, 5:51 PM

## 2020-06-20 NOTE — Progress Notes (Signed)
Speech Language Pathology Weekly Progress and Session Note  Patient Details  Name: Hannah [name] MRN: [id number] Date of Birth: August 14, 1965  Beginning of progress report period: June 13, 2020 End of progress report period: June 20, 2020  Today's Date: 06/20/2020 SLP Individual Time: 1130-1200 SLP Individual Time Calculation (min): 30 min  Short Term Goals: Week 1: SLP Short Term Goal 1 (Week 1): Pt will utilize memory aids to facilitate recall of daily information with supervision verbal cues. SLP Short Term Goal 1 - Progress (Week 1): Met SLP Short Term Goal 2 (Week 1): Pt will selectively attend to tasks in a mildly distracting environment with supervision verbal cues for redirection to task. SLP Short Term Goal 2 - Progress (Week 1): Met SLP Short Term Goal 3 (Week 1): Pt will complete mildly complex tasks with supervision verbal cues for functional problem solving. SLP Short Term Goal 3 - Progress (Week 1): Met    New Short Term Goals: Week 2: SLP Short Term Goal 1 (Week 2): STG=LTG due to remaining length of stay  Weekly Progress Updates: Pt has made excellent functional gains and met 3 out of 3 short term goals. Pt is currently supervision assist for mildly complex tasks, and supervision-min for complex tasks due to mild cognitive impairments impacting her problem solving, higher levels of attention, and short term recall. Pt also with baseline dysarthria s/p previous CVA. Pt has demonstrated improved problem solving, selective attention, and carryover of day to day activities and therapeutic techniques. Pt and family education is ongoing. Pt would continue to benefit from skilled ST while inpatient in order to maximize functional independence and reduce burden of care prior to discharge. Anticipate that pt will need 24/7 supervision at discharge in addition to [place] follow up at next level of care.    Intensity: Minumum of 1-2 x/day, 30 to 90 minutes Frequency: 3 to 5 out of 7  days Duration/Length of Stay: 06/26/20 Treatment/Interventions: Cognitive remediation/compensation;Internal/external aids;Cueing hierarchy;Functional tasks;Patient/family education   Daily Session  Skilled Therapeutic Interventions: Pt was seen for skilled ST targeting cognitive goals. Her daughter was present for session. SLP facilitated session initially with a semi-complex deductive reasoning puzzle, during which pt was Mod I for problem solving, and 1 cue required for awareness of a small error. SLP also introduced a novel card task (blink), during which pt required overall Supervision  A level cues for recall and a problem solving strategy within task. Pt left sitting in chair with alarm set and needs within reach, daughter still present. Continue per current plan of care.          Pain Pain Assessment Pain Scale: 0-10 Pain Score: 0-No pain  Therapy/Group: Individual Therapy  Arbutus Leas 06/20/2020, 7:24 AM

## 2020-06-21 DIAGNOSIS — D62 Acute posthemorrhagic anemia: Secondary | ICD-10-CM

## 2020-06-21 DIAGNOSIS — N184 Chronic kidney disease, stage 4 (severe): Secondary | ICD-10-CM

## 2020-06-21 DIAGNOSIS — E119 Type 2 diabetes mellitus without complications: Secondary | ICD-10-CM

## 2020-06-21 DIAGNOSIS — I10 Essential (primary) hypertension: Secondary | ICD-10-CM

## 2020-06-21 DIAGNOSIS — E1165 Type 2 diabetes mellitus with hyperglycemia: Secondary | ICD-10-CM

## 2020-06-21 LAB — GLUCOSE, CAPILLARY
Glucose-Capillary: 136 mg/dL — ABNORMAL HIGH (ref 70–99)
Glucose-Capillary: 68 mg/dL — ABNORMAL LOW (ref 70–99)
Glucose-Capillary: 85 mg/dL (ref 70–99)
Glucose-Capillary: 118 mg/dL — ABNORMAL HIGH (ref 70–99)
Glucose-Capillary: 124 mg/dL — ABNORMAL HIGH (ref 70–99)
Glucose-Capillary: 90 mg/dL (ref 70–99)

## 2020-06-21 MED ORDER — ENOXAPARIN SODIUM 30 MG/0.3ML ~~LOC~~ SOLN
30.0000 mg | SUBCUTANEOUS | Status: DC
  Administered 2020-06-21 – 2020-06-22 (×2): 30 mg via SUBCUTANEOUS
  Filled 2020-06-21 (×2): qty 0.3

## 2020-06-21 NOTE — Progress Notes (Addendum)
[place] PHYSICAL MEDICINE & REHABILITATION PROGRESS NOTE   Subjective/Complaints: Patient seen laying in bed this morning.  She states she slept well overnight.  She notes her abdomen is sore due to frequent injections.  ROS: Denies CP, SOB, N/V/D  Objective:   No results found. Recent Labs    06/19/20 1013  WBC 7.7  HGB 8.8*  HCT 27.8*  PLT 233   No results for input(s): NA, K, CL, CO2, GLUCOSE, BUN, CREATININE, CALCIUM in the last 72 hours.  Intake/Output Summary (Last 24 hours) at 06/21/2020 1415 Last data filed at 06/21/2020 0845 Gross per 24 hour  Intake 120 ml  Output --  Net 120 ml        Physical Exam: Vital Signs Blood pressure (!) 145/69, pulse 79, temperature 98.3 F (36.8 C), resp. rate 16, height '5\' 2"'$  (1.575 m), weight 65.4 kg, SpO2 99 %. Constitutional: No distress . Vital signs reviewed. HENT: Normocephalic.  Atraumatic. Eyes: EOMI. No discharge. Cardiovascular: No JVD.  RRR. Respiratory: Normal effort.  No stridor.  Bilateral clear to auscultation. GI: Non-distended.  BS +.  Abdominal tenderness at site of injections Skin: Warm and dry.  Abdominal bruising. Psych: Normal mood.  Normal behavior. Musc: No edema in extremities.  No tenderness in extremities. Neuro: Alert Motor: LUE/LLE: 5/5 proximal distal RUE/RLE: 3+/5 proximal distal  Assessment/Plan: 1. Functional deficits which require 3+ hours per day of interdisciplinary therapy in a comprehensive inpatient rehab setting.  Physiatrist is providing close team supervision and 24 hour management of active medical problems listed below.  Physiatrist and rehab team continue to assess barriers to discharge/monitor patient progress toward functional and medical goals  Care Tool:  Bathing    Body parts bathed by patient: Right arm,Chest,Abdomen,Right upper leg,Left upper leg,Face,Front perineal area,Buttocks,Left lower leg,Left arm,Right lower leg   Body parts bathed by helper: Right lower  leg     Bathing assist Assist Level: Contact Guard/Touching assist     Upper Body Dressing/Undressing Upper body dressing   What is the patient wearing?: Pull over shirt,Bra    Upper body assist Assist Level: Supervision/Verbal cueing    Lower Body Dressing/Undressing Lower body dressing      What is the patient wearing?: Underwear/pull up,Pants     Lower body assist Assist for lower body dressing: Minimal Assistance - Patient > 75%     Toileting Toileting    Toileting assist Assist for toileting: Moderate Assistance - Patient 50 - 74%     Transfers Chair/bed transfer  Transfers assist     Chair/bed transfer assist level: Minimal Assistance - Patient > 75% Chair/bed transfer assistive device: Walker,Armrests   Locomotion Ambulation   Ambulation assist      Assist level: Minimal Assistance - Patient > 75% Assistive device: Walker-rolling Max distance: 1104f   Walk 10 feet activity   Assist  Walk 10 feet activity did not occur: Safety/medical concerns  Assist level: Minimal Assistance - Patient > 75% Assistive device: Walker-rolling   Walk 50 feet activity   Assist Walk 50 feet with 2 turns activity did not occur: Safety/medical concerns  Assist level: Minimal Assistance - Patient > 75% Assistive device: Walker-rolling    Walk 150 feet activity   Assist Walk 150 feet activity did not occur: Safety/medical concerns         Walk 10 feet on uneven surface  activity   Assist Walk 10 feet on uneven surfaces activity did not occur: Safety/medical concerns  Wheelchair     Assist Will patient use wheelchair at discharge?: Yes Type of Wheelchair: Power    Wheelchair assist level: Minimal Assistance - Patient > 75%,Contact Guard/Touching assist Max wheelchair distance: 129f    Wheelchair 50 feet with 2 turns activity    Assist        Assist Level: Contact Guard/Touching assist,Minimal Assistance - Patient > 75%    Wheelchair 150 feet activity     Assist      Assist Level: Contact Guard/Touching assist,Minimal Assistance - Patient > 75%   Blood pressure (!) 145/69, pulse 79, temperature 98.3 F (36.8 C), resp. rate 16, height '5\' 2"'$  (1.575 m), weight 65.4 kg, SpO2 99 %.   Medical Problem List and Plan: 1.Visual changes, now with spasticity, and functional deficits secondary to acute nonhemorrhagic dorsal right pontine infarct as well as history of multiple CVA in the past with right-sided spastic weakness and slurred speech. ?MS lesions also  Continue CIR   Cont tizanidine 3 times daily, may need to do Botox as an outpatient  2. Antithrombotics: -DVT/anticoagulation:Subcutaneous heparin-patient refusing injections, requesting fewer injections, will transition to Lovenox-dosing discussed with pharmacy given elevated creatinine -antiplatelet therapy: Aspirin 81 mg daily and Plavix 75 mg daily 3. Pain Management:Tylenol as needed 4. Mood:Continue Prozac 20 mg daily -antipsychotic agents: N/A 5. Neuropsych: This patientiscapable of making decisions on herown behalf. 6. Skin/Wound Care:Routine skin checks 7. Fluids/Electrolytes/Nutrition:Routine in and outs  8. Hypertension. Continue Norvasc 10 mg daily, Lopressor 100 mg twice daily. Monitor with increased mobility Vitals:   06/21/20 0802 06/21/20 1306  BP: 136/68 (!) 145/69  Pulse: 78 79  Resp:  16  Temp:  98.3 F (36.8 C)  SpO2:  99%   Relatively controlled on 2/5 9. Diabetes mellitus with hyperglycemia. Hemoglobin A1c 8.9. Check blood sugars before meals and at bedtime.   CBG (last 3)  Recent Labs    06/21/20 0556 06/21/20 0626 06/21/20 1158  GLUCAP 68* 85 118*   Lantus decreased to 15U- reinforced need for snack qhs   Improving control on 2/5 10. CKD stage IV. Latest baseline creatinine from February 2021 was 2.81.   Started IVF at noc  Creatinine 4.41 2/1, labs ordered  for Monday 11.? Multiple sclerosis. Self-reported. Multiple new lesions on MRI c/w with diagnosis but pt apparently never has been treated.  -pt to f/u with Dr. SLeonie Manas outpt to further explore.  12. GERD. Pepcid 13. Hyperlipidemia. Crestor 14.  Anemia  Hemoglobin 8.8 on 2/3, continue to monitor LOS: 9 days A FACE TO FACE EVALUATION WAS PERFORMED  Johnthomas Lader ALorie Phenix2/09/2020, 2:15 PM

## 2020-06-21 NOTE — Progress Notes (Signed)
Patient refused SQ heparin shot this morning. States Dr was suppose to change order to once a day. Would like to wait until she sees rounding Dr this morning.

## 2020-06-21 NOTE — Progress Notes (Signed)
Occupational Therapy Session Note  Patient Details  Name: Hannah [name] MRN: [id number] Date of Birth: Sep 25, 1965  Today's Date: 06/21/2020 OT Group Time: 1100-1200 OT Group Time Calculation (min): 60 min  Skilled Therapeutic Interventions/Progress Updates:    Pt engaged in therapeutic w/c level dance group focusing on patient choice, UE/LE strengthening, salience, activity tolerance, and social participation. Pt was guided through various dance-based exercises involving UEs/LEs and trunk. All music was selected by group members. Emphasis placed on Rt NMR and activity tolerance. Pt with high levels of participation throughout session, socially interacting with others and actively moving her affected side. At end of session NT returned pt to the room via w/c.    Therapy Documentation Precautions:  Precautions Precautions: Fall Precaution Comments: R spastic hemiparesis in UE/LE Restrictions Weight Bearing Restrictions: No Pain: no s/s pain during tx   ADL: ADL Eating: Set up Where Assessed-Eating: Wheelchair Grooming: Supervision/safety Where Assessed-Grooming: Wheelchair Upper Body Bathing: Minimal assistance Where Assessed-Upper Body Bathing: Wheelchair Lower Body Bathing: Moderate assistance Where Assessed-Lower Body Bathing: Wheelchair Upper Body Dressing: Maximal assistance Where Assessed-Upper Body Dressing: Wheelchair Lower Body Dressing: Maximal assistance Where Assessed-Lower Body Dressing: Wheelchair Toileting: Moderate assistance Where Assessed-Toileting: Bedside Commode Toilet Transfer: Moderate assistance Toilet Transfer Method: Stand pivot Toilet Transfer Equipment: Bedside commode     Therapy/Group: Group Therapy  Mayola Mcbain A Shanley Mcarthy 06/21/2020, 12:40 PM

## 2020-06-21 NOTE — Progress Notes (Signed)
Hypoglycemic Event  CBG: 68  Treatment: OJ and graham crackers with PB  Symptoms: asymptomatic   Follow-up CBG: Time: 0626 CBG Result: 85  Possible Reasons for Event: bedtime lantus  Comments/MD notified: protocol followed    Chilton Si, LPN

## 2020-06-22 LAB — GLUCOSE, CAPILLARY
Glucose-Capillary: 150 mg/dL — ABNORMAL HIGH (ref 70–99)
Glucose-Capillary: 51 mg/dL — ABNORMAL LOW (ref 70–99)
Glucose-Capillary: 86 mg/dL (ref 70–99)
Glucose-Capillary: 169 mg/dL — ABNORMAL HIGH (ref 70–99)
Glucose-Capillary: 213 mg/dL — ABNORMAL HIGH (ref 70–99)

## 2020-06-22 NOTE — Progress Notes (Signed)
Hypoglycemic Event  CBG: 51  Treatment: 4 oz juice/soda  Symptoms: None  Follow-up CBG: Time:1225 CBG Result:86  Possible Reasons for Event: Unknown  Comments/MD notified:Dr Cruz Condon, Daleen Bo

## 2020-06-23 LAB — BASIC METABOLIC PANEL
Anion gap: 9 (ref 5–15)
BUN: 27 mg/dL — ABNORMAL HIGH (ref 6–20)
CO2: 20 mmol/L — ABNORMAL LOW (ref 22–32)
Calcium: 8.6 mg/dL — ABNORMAL LOW (ref 8.9–10.3)
Chloride: 108 mmol/L (ref 98–111)
Creatinine, Ser: 3.92 mg/dL — ABNORMAL HIGH (ref 0.44–1.00)
GFR, Estimated: 13 mL/min — ABNORMAL LOW (ref >60)
Glucose, Bld: 78 mg/dL (ref 70–99)
Potassium: 4.5 mmol/L (ref 3.5–5.1)
Sodium: 137 mmol/L (ref 135–145)

## 2020-06-23 LAB — GLUCOSE, CAPILLARY
Glucose-Capillary: 79 mg/dL (ref 70–99)
Glucose-Capillary: 79 mg/dL (ref 70–99)
Glucose-Capillary: 128 mg/dL — ABNORMAL HIGH (ref 70–99)
Glucose-Capillary: 157 mg/dL — ABNORMAL HIGH (ref 70–99)

## 2020-06-23 MED ORDER — MAGNESIUM CITRATE PO SOLN
1.0000 | Freq: Once | ORAL | Status: AC
  Administered 2020-06-23: 1 via ORAL
  Filled 2020-06-23: qty 296

## 2020-06-23 MED ORDER — MELATONIN 3 MG PO TABS
3.0000 mg | ORAL_TABLET | Freq: Every day | ORAL | Status: DC
  Administered 2020-06-23 – 2020-06-25 (×3): 3 mg via ORAL
  Filled 2020-06-23 (×4): qty 1

## 2020-06-23 NOTE — Progress Notes (Signed)
Physical Therapy Session Note  Patient Details  Name: Hannah [name] MRN: [id number] Date of Birth: February 19, 1966  Today's Date: 06/23/2020 PT Individual Time: 1000-1200 PT Individual Time Calculation (min): 120 min   Short Term Goals: Week 1:  PT Short Term Goal 1 (Week 1): Patient will complete bed mobility with MinA consistently PT Short Term Goal 1 - Progress (Week 1): Met PT Short Term Goal 2 (Week 1): Patient will transfers bed<> wc with LRAD and MinA consistently PT Short Term Goal 2 - Progress (Week 1): Met PT Short Term Goal 3 (Week 1): Patient will ambulate >77f with LRAD and ModA, 2nd person for wc follow if needed PT Short Term Goal 3 - Progress (Week 1): Met Week 2:  PT Short Term Goal 1 (Week 2): = to LTGs based on ELOS Week 3:     Skilled Therapeutic Interventions/Progress Updates:    PAIN denies pain  Pt initially oob in manual wc.  Daughter MOctavia Brucknerat bedside. Discussed family training, goals of pt/daughter for session.  Pt transported to gym w/daughter to address stairs, etc.   At this point, vendor from [employer] arrived w/loaner [employer].   Sit to stand and short distance gait to loaner wc w/cga.  Loaner wc w/very good seating fit, headrest adjusted w/assist from wc vendor.  Pt  And daughter instructed w/all wc controls and pt practiced propulsion on unit including: Turning, backing, negotiating tight spaces, ascending/descending ramp, safety adjacent to curb/drop off to increase awareness of base of wc/back casters due to front end drive which she has not been using on rehab unit.   Pt practiced adjusting wc parts/tilting/reclining/ and seat elevation features.   Discussed charging wc nightly and process, demonstrated shifting wc to manual via wheel locks. Pt and daughter pleased w/wc.  Discussed w/vendor need for delivery of loaner to pt home on Th. For DC.  Vendor agreed to make arrangements.  Stairs: Pt has 3 cement approx 7in high deep steps to access apartment per  daughter/pt.  Demonstrated ascending/descending via ant and post approach.  Pt Sit to stand and short distance gait to curb.  Ascended via each above approach w/min assist and cues for sequencing, additional time for safe placement of L foot onto step w/post approach.  Daughter observed and talked thru guarding.  While pt rested, daughter practiced guarding w/therapist acting as pt, verbal cues provided for safety.  Pt then repeated gait and steps as above w/daughter guarding pt, cues for technique required.  Pt experienced loss of balance requiring mod assist for recovery/therapist intervening to assist for safety.  Discussed likelihood of need for chair "rest station" on steps as pt may have difficulty w/completing 3 steps in a row due to fatigue/weakness.  Pt and daughter agreed.  Car transfer:  Discussed and demonstrated safe technique w/RW  Also discussed use of seat height adjustment capability of wc in event of illness/physical decline for ease of work. Daughter assisted pt wc to car w/RW, cga, min cues for daugther safety w/guarding/for pt hand placement w/transfer.     Daugher requested family training for sister, stated she did not understand that sister was permitted to attend today as well.  Discussed need for additional session w/social worker/scheduler.  Pt propelled wc > 4024fon unit w/occasional cues.  stand pivot transfer w/rw wc to bed w/min assist.  Sit to supine w/rail and cues.  Pt left supine w/rails up x 3, alarm set, bed in lowest position, and needs in reach.   Therapy Documentation  Precautions:  Precautions Precautions: Fall Precaution Comments: R spastic hemiparesis in UE/LE Restrictions Weight Bearing Restrictions: No    Therapy/Group: Individual Therapy  Callie Fielding, [place] 06/23/2020, 4:14 PM

## 2020-06-23 NOTE — Progress Notes (Signed)
Speech Language Pathology Daily Session Note  Patient Details  Name: [name] MRN: [id number] Date of Birth: [birth year]  Today's Date: 06/23/2020 SLP Individual Time: 0900-0955 SLP Individual Time Calculation (min): 55 min  Short Term Goals: Week 2: SLP Short Term Goal 1 (Week 2): STG=LTG due to remaining length of stay  Skilled Therapeutic Interventions: Skilled treatment session focused on completion of family education with the patient and her daughter. Both were educated regarding patient's current cognitive functioning and strategies to utilize at home to maximize recall of functional information, problem solving, attention and overall safety, especially the importance of 24 hour supervision. Both were also educated regarding the importance of functional independence with familiar cognitive tasks in order to maximize cognitive recovery. Both verbalized understanding and SLP assisted patient in generating a list of activities she can participate in at home to stay cognitively active (sort meal, fold/sort laundry, meal planning, etc).  Handouts were given to reinforce information. Patient left upright in the wheelchair with alarm on, family present and all needs within reach. Continue with current plan of care.      Pain No/Denies Pain   Therapy/Group: Individual Therapy  Hodari Chuba 06/23/2020, 12:41 PM

## 2020-06-23 NOTE — Progress Notes (Signed)
Occupational Therapy Session Note  Patient Details  Name: Hannah [name] MRN: [id number] Date of Birth: [birth year]  Today's Date: 06/23/2020 OT Individual Time: [id number] OT Individual Time Calculation (min): 58 min    Short Term Goals: Week 2:  OT Short Term Goal 1 (Week 2): Continue working on established LTGs set at supervision to min guard assist overall.  Skilled Therapeutic Interventions/Progress Updates:    Pt completed shower and dressing during session.  She was able to transfer to the EOB with supervision and then ambulate to the walk-in shower with min guard assist using the RW for support.  She completed undressing with min guard assist as well and completed shower sit to stand at the same level.  Her daughter was present throughout for education.  [name] completed UB dressing with setup and then LB dressing with min assist overall for pulling up clothing on the right side and for donning TEDS and shoes.  Therapist also assisted with tying them.  Finished session with pt sitting in the wheelchair with call button and phone in reach and safety belt in place.    Therapy Documentation Precautions:  Precautions Precautions: Fall Precaution Comments: R spastic hemiparesis in UE/LE Restrictions Weight Bearing Restrictions: No  Pain: Pain Assessment Pain Scale: Faces Pain Score: 0-No pain ADL: See Care Tool Section for some details of mobility and selfcare   Therapy/Group: Individual Therapy  Oma Marzan OTR/L 06/23/2020, 12:10 PM

## 2020-06-23 NOTE — Progress Notes (Signed)
[place] PHYSICAL MEDICINE & REHABILITATION PROGRESS NOTE   Subjective/Complaints: Has not had a BM in 4-5 days Sleeping poorly at night Cr 3.92 Has "lump" in left lower quadrant  ROS: Denies CP, SOB, N/V/D, +constipation  Objective:   No results found. No results for input(s): WBC, HGB, HCT, PLT in the last 72 hours. Recent Labs    06/23/20 0646  NA 137  K 4.5  CL 108  CO2 20*  GLUCOSE 78  BUN 27*  CREATININE 3.92*  CALCIUM 8.6*    Intake/Output Summary (Last 24 hours) at 06/23/2020 1341 Last data filed at 06/22/2020 1850 Gross per 24 hour  Intake 480 ml  Output --  Net 480 ml        Physical Exam: Vital Signs Blood pressure (!) 158/73, pulse 86, temperature 98.7 F (37.1 C), resp. rate 18, height '5\' 2"'$  (1.575 m), weight 66.5 kg, SpO2 96 %. Gen: no distress, normal appearing HEENT: oral mucosa pink and moist, NCAT Cardio: Reg rate Chest: normal effort, normal rate of breathing GI: Non-distended.  BS +.  Abdominal tenderness at site of injections Skin: Warm and dry.  Abdominal bruising. Psych: Normal mood.  Normal behavior. Musc: No edema in extremities.  No tenderness in extremities. Neuro: Alert Motor: LUE/LLE: 5/5 proximal distal RUE/RLE: 3+/5 proximal distal  Assessment/Plan: 1. Functional deficits which require 3+ hours per day of interdisciplinary therapy in a comprehensive inpatient rehab setting.  Physiatrist is providing close team supervision and 24 hour management of active medical problems listed below.  Physiatrist and rehab team continue to assess barriers to discharge/monitor patient progress toward functional and medical goals  Care Tool:  Bathing    Body parts bathed by patient: Right arm,Chest,Abdomen,Right upper leg,Left upper leg,Face,Front perineal area,Buttocks,Left lower leg,Left arm,Right lower leg   Body parts bathed by helper: Right lower leg     Bathing assist Assist Level: Contact Guard/Touching assist     Upper Body  Dressing/Undressing Upper body dressing   What is the patient wearing?: Pull over shirt,Bra    Upper body assist Assist Level: Supervision/Verbal cueing    Lower Body Dressing/Undressing Lower body dressing      What is the patient wearing?: Underwear/pull up,Pants     Lower body assist Assist for lower body dressing: Minimal Assistance - Patient > 75%     Toileting Toileting    Toileting assist Assist for toileting: Moderate Assistance - Patient 50 - 74%     Transfers Chair/bed transfer  Transfers assist     Chair/bed transfer assist level: Contact Guard/Touching assist Chair/bed transfer assistive device: Programmer, multimedia   Ambulation assist      Assist level: Contact Guard/Touching assist Assistive device: Walker-rolling Max distance: 10'   Walk 10 feet activity   Assist  Walk 10 feet activity did not occur: Safety/medical concerns  Assist level: Contact Guard/Touching assist Assistive device: Walker-rolling   Walk 50 feet activity   Assist Walk 50 feet with 2 turns activity did not occur: Safety/medical concerns  Assist level: Minimal Assistance - Patient > 75% Assistive device: Walker-rolling    Walk 150 feet activity   Assist Walk 150 feet activity did not occur: Safety/medical concerns         Walk 10 feet on uneven surface  activity   Assist Walk 10 feet on uneven surfaces activity did not occur: Safety/medical concerns         Wheelchair     Assist Will patient use wheelchair at discharge?: Yes  Type of Wheelchair: Power    Wheelchair assist level: Minimal Assistance - Patient > 75%,Contact Guard/Touching assist Max wheelchair distance: >350    Wheelchair 50 feet with 2 turns activity    Assist        Assist Level: Contact Guard/Touching assist,Minimal Assistance - Patient > 75%   Wheelchair 150 feet activity     Assist      Assist Level: Contact Guard/Touching assist,Minimal  Assistance - Patient > 75%   Blood pressure (!) 158/73, pulse 86, temperature 98.7 F (37.1 C), resp. rate 18, height '5\' 2"'$  (1.575 m), weight 66.5 kg, SpO2 96 %.   Medical Problem List and Plan: 1.Visual changes, now with spasticity, and functional deficits secondary to acute nonhemorrhagic dorsal right pontine infarct as well as history of multiple CVA in the past with right-sided spastic weakness and slurred speech. ?MS lesions also  Continue CIR   Cont tizanidine 3 times daily, may need to do Botox as an outpatient  2. Antithrombotics: -DVT/anticoagulation:Subcutaneous heparin-patient refusing injections, requesting fewer injections, will transition to Lovenox-dosing discussed with pharmacy given elevated creatinine 2/7: d/c Lovenox since ambulating >150 feet.  -antiplatelet therapy: Aspirin 81 mg daily and Plavix 75 mg daily 3. Pain Management:Tylenol as needed 4. Mood:Continue Prozac 20 mg daily -antipsychotic agents: N/A 5. Neuropsych: This patientiscapable of making decisions on herown behalf. 6. Skin/Wound Care:Routine skin checks 7. Fluids/Electrolytes/Nutrition:Routine in and outs  8. Hypertension. Continue Norvasc 10 mg daily, Lopressor 100 mg twice daily. Monitor with increased mobility Vitals:   06/22/20 2131 06/23/20 0513  BP: 121/63 (!) 158/73  Pulse:  86  Resp:  18  Temp:  98.7 F (37.1 C)  SpO2:  96%   Relatively controlled on 2/5 9. Diabetes mellitus with hyperglycemia. Hemoglobin A1c 8.9. Check blood sugars before meals and at bedtime.   CBG (last 3)  Recent Labs    06/22/20 2040 06/23/20 0552 06/23/20 1201  GLUCAP 213* 79 79   Lantus decreased to 15U- reinforced need for snack qhs   Improving control on 2/5 10. CKD stage IV. Latest baseline creatinine from February 2021 was 2.81.   Started IVF at noc  Creatinine 4.41 2/1, 3.92 on 2/7 11.? Multiple sclerosis. Self-reported. Multiple new lesions  on MRI c/w with diagnosis but pt apparently never has been treated.  -pt to f/u with Dr. Leonie Man as outpt to further explore.  12. GERD. Pepcid 13. Hyperlipidemia. Crestor 14.  Anemia  Hemoglobin 8.8 on 2/3, continue to monitor 15. Constipation: magnesium citrate ordered.  16. Insomnia: melatonin ordered.  LOS: 11 days A FACE TO FACE EVALUATION WAS PERFORMED  Martha Clan P Bobetta Korf 06/23/2020, 1:41 PM

## 2020-06-24 LAB — GLUCOSE, CAPILLARY
Glucose-Capillary: 55 mg/dL — ABNORMAL LOW (ref 70–99)
Glucose-Capillary: 97 mg/dL (ref 70–99)
Glucose-Capillary: 66 mg/dL — ABNORMAL LOW (ref 70–99)
Glucose-Capillary: 86 mg/dL (ref 70–99)
Glucose-Capillary: 66 mg/dL — ABNORMAL LOW (ref 70–99)
Glucose-Capillary: 100 mg/dL — ABNORMAL HIGH (ref 70–99)
Glucose-Capillary: 177 mg/dL — ABNORMAL HIGH (ref 70–99)

## 2020-06-24 MED ORDER — INSULIN GLARGINE 100 UNIT/ML ~~LOC~~ SOLN
14.0000 [IU] | Freq: Every day | SUBCUTANEOUS | Status: DC
  Administered 2020-06-24: 14 [IU] via SUBCUTANEOUS
  Filled 2020-06-24 (×2): qty 0.14

## 2020-06-24 NOTE — Progress Notes (Signed)
[place] PHYSICAL MEDICINE & REHABILITATION PROGRESS NOTE   Subjective/Complaints: Was able to have a BM last night after receiving magnesium citrate, but she felt nauseous. Feeling better this morning. PRN Zofran is ordered. Has some pain in her right hand, feels tight.   ROS: Denies CP, SOB, V/D, constipation  Objective:   No results found. No results for input(s): WBC, HGB, HCT, PLT in the last 72 hours. Recent Labs    06/23/20 0646  NA 137  K 4.5  CL 108  CO2 20*  GLUCOSE 78  BUN 27*  CREATININE 3.92*  CALCIUM 8.6*   No intake or output data in the 24 hours ending 06/24/20 0951      Physical Exam: Vital Signs Blood pressure (!) 153/72, pulse 84, temperature 99.2 F (37.3 C), temperature source Oral, resp. rate 19, height '5\' 2"'$  (1.575 m), weight 66.5 kg, SpO2 96 %. Gen: no distress, normal appearing HEENT: oral mucosa pink and moist, NCAT Cardio: Reg rate Chest: normal effort, normal rate of breathing GI: Non-distended.  BS +.  Abdominal tenderness at site of injections Skin: Warm and dry.  Abdominal bruising. Psych: Normal mood.  Normal behavior. Musc: No edema in extremities.  No tenderness in extremities. Neuro: Alert Motor: LUE/LLE: 5/5 proximal distal RUE/RLE: 3+/5 proximal distal  Assessment/Plan: 1. Functional deficits which require 3+ hours per day of interdisciplinary therapy in a comprehensive inpatient rehab setting.  Physiatrist is providing close team supervision and 24 hour management of active medical problems listed below.  Physiatrist and rehab team continue to assess barriers to discharge/monitor patient progress toward functional and medical goals  Care Tool:  Bathing    Body parts bathed by patient: Right arm,Chest,Abdomen,Right upper leg,Left upper leg,Face,Front perineal area,Buttocks,Left lower leg,Left arm,Right lower leg   Body parts bathed by helper: Right lower leg     Bathing assist Assist Level: Contact Guard/Touching  assist     Upper Body Dressing/Undressing Upper body dressing   What is the patient wearing?: Pull over shirt,Bra    Upper body assist Assist Level: Supervision/Verbal cueing    Lower Body Dressing/Undressing Lower body dressing      What is the patient wearing?: Underwear/pull up,Pants     Lower body assist Assist for lower body dressing: Minimal Assistance - Patient > 75%     Toileting Toileting    Toileting assist Assist for toileting: Moderate Assistance - Patient 50 - 74%     Transfers Chair/bed transfer  Transfers assist     Chair/bed transfer assist level: Contact Guard/Touching assist Chair/bed transfer assistive device: Programmer, multimedia   Ambulation assist      Assist level: Contact Guard/Touching assist Assistive device: Walker-rolling Max distance: 10'   Walk 10 feet activity   Assist  Walk 10 feet activity did not occur: Safety/medical concerns  Assist level: Contact Guard/Touching assist Assistive device: Walker-rolling   Walk 50 feet activity   Assist Walk 50 feet with 2 turns activity did not occur: Safety/medical concerns  Assist level: Minimal Assistance - Patient > 75% Assistive device: Walker-rolling    Walk 150 feet activity   Assist Walk 150 feet activity did not occur: Safety/medical concerns         Walk 10 feet on uneven surface  activity   Assist Walk 10 feet on uneven surfaces activity did not occur: Safety/medical concerns         Wheelchair     Assist Will patient use wheelchair at discharge?: Yes Type of Wheelchair:  Power    Wheelchair assist level: Minimal Assistance - Patient > 75%,Contact Guard/Touching assist Max wheelchair distance: >350    Wheelchair 50 feet with 2 turns activity    Assist        Assist Level: Contact Guard/Touching assist,Minimal Assistance - Patient > 75%   Wheelchair 150 feet activity     Assist      Assist Level: Contact  Guard/Touching assist,Minimal Assistance - Patient > 75%   Blood pressure (!) 153/72, pulse 84, temperature 99.2 F (37.3 C), temperature source Oral, resp. rate 19, height '5\' 2"'$  (1.575 m), weight 66.5 kg, SpO2 96 %.   Medical Problem List and Plan: 1.Visual changes, now with spasticity, and functional deficits secondary to acute nonhemorrhagic dorsal right pontine infarct as well as history of multiple CVA in the past with right-sided spastic weakness and slurred speech. ?MS lesions also  Continue CIR   Cont tizanidine 3 times daily, may need to do Botox as an outpatient  2. Antithrombotics: -DVT/anticoagulation:Subcutaneous heparin-patient refusing injections, requesting fewer injections, will transition to Lovenox-dosing discussed with pharmacy given elevated creatinine 2/7: d/c Lovenox since ambulating >150 feet.  -antiplatelet therapy: Aspirin 81 mg daily and Plavix 75 mg daily 3. Pain Management:Tylenol as needed. Ice added for right hand tightness/pain as she has allergy to ibuprofen so will not give voltaren gel.  4. Mood:Continue Prozac 20 mg daily -antipsychotic agents: N/A 5. Neuropsych: This patientiscapable of making decisions on herown behalf. 6. Skin/Wound Care:Routine skin checks 7. Fluids/Electrolytes/Nutrition:Routine in and outs  8. Hypertension. Continue Norvasc 10 mg daily, Lopressor 100 mg twice daily. Monitor with increased mobility Vitals:   06/23/20 2022 06/24/20 0425  BP: (!) 156/75 (!) 153/72  Pulse: 88 84  Resp: 16 19  Temp: 98.9 F (37.2 C) 99.2 F (37.3 C)  SpO2: 94% 0000000   2/8: Systolic BP elevated. Has been for last 4 reads. HR is normal. Was stable prior. Check BP at home and bring to f/u appointment.  9. Diabetes mellitus with hyperglycemia. Hemoglobin A1c 8.9. Check blood sugars before meals and at bedtime.   CBG (last 3)  Recent Labs    06/23/20 2053 06/24/20 0537 06/24/20 0617  GLUCAP 157*  55* 97   Lantus decreased to 15U- reinforced need for snack qhs   2/8: had hypoglycemic event- decrease Lantus to 14U.  10. CKD stage IV. Latest baseline creatinine from February 2021 was 2.81.   Started IVF at noc  Creatinine 4.41 2/1, 3.92 on 2/7 11.? Multiple sclerosis. Self-reported. Multiple new lesions on MRI c/w with diagnosis but pt apparently never has been treated.  -pt to f/u with Dr. Leonie Man as outpt to further explore.  12. GERD. Pepcid 13. Hyperlipidemia. Crestor 14.  Anemia  Hemoglobin 8.8 on 2/3, continue to monitor 15. Constipation: magnesium citrate ordered. Had BM with this.  16. Insomnia: melatonin ordered. This has helped LOS: 12 days A FACE TO [place] Carry Weesner 06/24/2020, 9:51 AM

## 2020-06-24 NOTE — Progress Notes (Signed)
Speech Language Pathology Daily Session Note  Patient Details  Name: Stana Wakeford MRN: [id number] Date of Birth: 02-09-1966  Today's Date: 06/24/2020 SLP Individual Time: [id number] SLP Individual Time Calculation (min): 40 min  Short Term Goals: Week 2: SLP Short Term Goal 1 (Week 2): STG=LTG due to remaining length of stay  Skilled Therapeutic Interventions: Pt was seen for skilled ST targeting cognitive skills. SLP facilitated session with Supervision A level cues for organization and problem solving throughout a novel daily scheduling complex deductive reasoning puzzle. She also reported some double vision throughout task (when reading). When presented with other verbal but functional complex time-based scenarios, pt planned and problem solved scheduling those tasks and calculating correct times Mod I for problem solving. Pt also anticipated scheduling needs at home Mod I. Pt left upright in wheelchair with alarm set and needs within reach. Continue per current plan of care.        Pain Pain Assessment Pain Scale: 0-10 Pain Score: 0-No pain  Therapy/Group: Individual Therapy  Arbutus Leas 06/24/2020, 7:24 AM

## 2020-06-24 NOTE — Discharge Summary (Signed)
Physician Discharge Summary  Patient ID: Hannah [name] MRN: [id number] DOB/AGE: August 01, 1965 55 y.o.  Admit date: 06/12/2020 Discharge date: 06/26/2020  Discharge Diagnoses:  Principal Problem:   Right pontine cerebrovascular accident [facility]) Active Problems:   Acute blood loss anemia   Chronic kidney disease (CKD), stage IV (severe) ([place])   Uncontrolled type 2 diabetes mellitus with hyperglycemia ([place])   Essential hypertension GERD Hyperlipidemia Chronic anemia Insomnia Questionable multiple sclerosis/self-reported  Discharged Condition: Stable  Significant Diagnostic Studies: MR ANGIO HEAD WO CONTRAST  Result Date: 06/09/2020 CLINICAL DATA:  Neuro deficit, acute, stroke suspected. EXAM: MRI HEAD WITHOUT CONTRAST MRA HEAD WITHOUT CONTRAST TECHNIQUE: Multiplanar, multiecho pulse sequences of the brain and surrounding structures were obtained without intravenous contrast. Angiographic images of the head were obtained using MRA technique without contrast. COMPARISON:  Head CT June 09, 2020. MRI of the brain June 17, 2018. FINDINGS: MRI HEAD FINDINGS Brain: Punctate focus of restricted diffusion within the dorsal pons on the right side. This may represent an acute/subacute infarct versus active demyelination. Scattered and confluent foci of T2 hyperintensity are seen within the white matter of the cerebral hemispheres and within the pons, nonspecific, consistent with the clinical diagnosis of multiple sclerosis with multiple new lesions seen when compared to prior MRI. Foci of tissue loss within the bilateral lenticulocapsular region and within the pons with associated susceptibility artifact most likely represent remote lacunar infarcts. Vascular: Normal flow voids. Skull and upper cervical spine: Normal marrow signal. Sinuses/Orbits: Bilateral lens surgery. Mucous retention cyst in the left sphenoid sinus. MRA HEAD FINDINGS The visualized portions of the distal cervical and intracranial  internal carotid arteries are widely patent with normal flow related enhancement. The bilateral anterior cerebral arteries and middle cerebral arteries are widely patent with antegrade flow without high-grade flow-limiting stenosis or proximal branch occlusion. No intracranial aneurysm within the anterior circulation. The vertebral arteries are widely patent with antegrade flow. The posterior inferior cerebral arteries are normal. Vertebrobasilar junction and basilar artery are widely patent with antegrade flow without evidence of basilar stenosis or aneurysm. Posterior cerebral arteries are normal bilaterally. No intracranial aneurysm within the posterior circulation. IMPRESSION: 1. Punctate focus of restricted diffusion within the dorsal pons on the right side. This may represent an acute/subacute infarct versus active demyelination. 2. Scattered and confluent foci of T2 hyperintensity are seen within the white matter of the cerebral hemispheres and pons, nonspecific, consistent with the clinical diagnosis of multiple sclerosis with multiple new lesions. 3. Unremarkable MRA of the head without evidence of high-grade flow-limiting stenosis or proximal branch occlusion. These results were discussed in person at the time of interpretation on 06/09/2020 at 9:35 am with Dr. Lesleigh Noe . Electronically Signed   By: Pedro Earls M.D.   On: 06/09/2020 09:36   MR BRAIN WO CONTRAST  Result Date: 06/09/2020 CLINICAL DATA:  Neuro deficit, acute, stroke suspected. EXAM: MRI HEAD WITHOUT CONTRAST MRA HEAD WITHOUT CONTRAST TECHNIQUE: Multiplanar, multiecho pulse sequences of the brain and surrounding structures were obtained without intravenous contrast. Angiographic images of the head were obtained using MRA technique without contrast. COMPARISON:  Head CT June 09, 2020. MRI of the brain June 17, 2018. FINDINGS: MRI HEAD FINDINGS Brain: Punctate focus of restricted diffusion within the dorsal pons  on the right side. This may represent an acute/subacute infarct versus active demyelination. Scattered and confluent foci of T2 hyperintensity are seen within the white matter of the cerebral hemispheres and within the pons, nonspecific, consistent with the clinical diagnosis of  multiple sclerosis with multiple new lesions seen when compared to prior MRI. Foci of tissue loss within the bilateral lenticulocapsular region and within the pons with associated susceptibility artifact most likely represent remote lacunar infarcts. Vascular: Normal flow voids. Skull and upper cervical spine: Normal marrow signal. Sinuses/Orbits: Bilateral lens surgery. Mucous retention cyst in the left sphenoid sinus. MRA HEAD FINDINGS The visualized portions of the distal cervical and intracranial internal carotid arteries are widely patent with normal flow related enhancement. The bilateral anterior cerebral arteries and middle cerebral arteries are widely patent with antegrade flow without high-grade flow-limiting stenosis or proximal branch occlusion. No intracranial aneurysm within the anterior circulation. The vertebral arteries are widely patent with antegrade flow. The posterior inferior cerebral arteries are normal. Vertebrobasilar junction and basilar artery are widely patent with antegrade flow without evidence of basilar stenosis or aneurysm. Posterior cerebral arteries are normal bilaterally. No intracranial aneurysm within the posterior circulation. IMPRESSION: 1. Punctate focus of restricted diffusion within the dorsal pons on the right side. This may represent an acute/subacute infarct versus active demyelination. 2. Scattered and confluent foci of T2 hyperintensity are seen within the white matter of the cerebral hemispheres and pons, nonspecific, consistent with the clinical diagnosis of multiple sclerosis with multiple new lesions. 3. Unremarkable MRA of the head without evidence of high-grade flow-limiting stenosis or  proximal branch occlusion. These results were discussed in person at the time of interpretation on 06/09/2020 at 9:35 am with Dr. Lesleigh Noe . Electronically Signed   By: Pedro Earls M.D.   On: 06/09/2020 09:36   MR BRAIN W CONTRAST  Result Date: 06/09/2020 CLINICAL DATA:  Brain mass or lesion. MRI of the brain earlier today showing focus of restricted diffusion within the pons concerning for infarct versus active demyelination. EXAM: MRI HEAD WITH CONTRAST TECHNIQUE: Multiplanar, multiecho pulse sequences of the brain and surrounding structures were obtained with intravenous contrast. CONTRAST:  73m GADAVIST GADOBUTROL 1 MMOL/ML IV SOLN COMPARISON:  MRI of the brain without contrast June 09, 2020. FINDINGS: Limited study with only T1 postcontrast images and T2 coronal. No focus of abnormal contrast enhancement associated with the T2 hyperintense white matter lesions or correlating with focus of restricted diffusion within the pons described on prior MRI to suggest active demyelination. Therefore, focus of restricted diffusion seen on prior MRI likely represents a small acute ischemic insult. IMPRESSION: No focus of abnormal contrast enhancement. Focus of restricted diffusion within the pons seen on prior MRI likely represents a small acute ischemic insult. Electronically Signed   By: KPedro EarlsM.D.   On: 06/09/2020 14:19   ECHOCARDIOGRAM COMPLETE  Result Date: 06/11/2020    ECHOCARDIOGRAM REPORT   Patient Name:   SPAGET YOUNKINSDate of Exam: 06/11/2020 Medical Rec #:  [id number]   Height:       62.0 in Accession #:    [id number]  Weight:       147.0 lb Date of Birth:  112-10-1965  BSA:          1.677 m Patient Age:    89years     BP:           154/81 mmHg Patient Gender: F            HR:           68 bpm. Exam Location:  Inpatient Procedure: 2D Echo, Cardiac Doppler and Color Doppler Indications:    Stroke 434.91 / I163.9  History:  Patient has no prior  history of Echocardiogram examinations.                 Stroke; Risk Factors:Hypertension, Diabetes, Dyslipidemia and                 Non-Smoker.  Sonographer:    Vickie Epley RDCS Referring Phys: [place]  1. Left ventricular ejection fraction, by estimation, is 60 to 65%. The left ventricle has normal function. The left ventricle has no regional wall motion abnormalities. Left ventricular diastolic parameters are consistent with Grade II diastolic dysfunction (pseudonormalization). Elevated left ventricular end-diastolic pressure.  2. Right ventricular systolic function is normal. The right ventricular size is normal.  3. The pericardial effusion is posterior to the left ventricle.  4. The mitral valve is normal in structure. Trivial mitral valve regurgitation. No evidence of mitral stenosis.  5. The aortic valve is normal in structure. Aortic valve regurgitation is trivial. No aortic stenosis is present.  6. The inferior vena cava is normal in size with greater than 50% respiratory variability, suggesting right atrial pressure of 3 mmHg. FINDINGS  Left Ventricle: Left ventricular ejection fraction, by estimation, is 60 to 65%. The left ventricle has normal function. The left ventricle has no regional wall motion abnormalities. The left ventricular internal cavity size was normal in size. There is  no left ventricular hypertrophy. Left ventricular diastolic parameters are consistent with Grade II diastolic dysfunction (pseudonormalization). Elevated left ventricular end-diastolic pressure. Right Ventricle: The right ventricular size is normal. No increase in right ventricular wall thickness. Right ventricular systolic function is normal. Left Atrium: Left atrial size was normal in size. Right Atrium: Right atrial size was normal in size. Pericardium: Trivial pericardial effusion is present. The pericardial effusion is posterior to the left ventricle. Mitral Valve: The mitral valve is normal  in structure. Trivial mitral valve regurgitation. No evidence of mitral valve stenosis. Tricuspid Valve: The tricuspid valve is normal in structure. Tricuspid valve regurgitation is trivial. No evidence of tricuspid stenosis. Aortic Valve: The aortic valve is normal in structure. Aortic valve regurgitation is trivial. No aortic stenosis is present. Pulmonic Valve: The pulmonic valve was normal in structure. Pulmonic valve regurgitation is not visualized. No evidence of pulmonic stenosis. Aorta: The aortic root is normal in size and structure. Venous: The inferior vena cava is normal in size with greater than 50% respiratory variability, suggesting right atrial pressure of 3 mmHg. IAS/Shunts: No atrial level shunt detected by color flow Doppler.  LEFT VENTRICLE PLAX 2D LVIDd:         3.50 cm     Diastology LVIDs:         2.70 cm     LV e' medial:    3.75 cm/s LV PW:         0.80 cm     LV E/e' medial:  19.1 LV IVS:        0.80 cm     LV e' lateral:   4.13 cm/s LVOT diam:     1.90 cm     LV E/e' lateral: 17.3 LV SV:         51 LV SV Index:   31 LVOT Area:     2.84 cm  LV Volumes (MOD) LV vol d, MOD A2C: 77.4 ml LV vol d, MOD A4C: 86.3 ml LV vol s, MOD A2C: 31.0 ml LV vol s, MOD A4C: 38.0 ml LV SV MOD A2C:     46.4 ml LV SV  MOD A4C:     86.3 ml LV SV MOD BP:      49.8 ml RIGHT VENTRICLE RV S prime:     10.10 cm/s TAPSE (M-mode): 1.8 cm LEFT ATRIUM           Index       RIGHT ATRIUM          Index LA diam:      2.90 cm 1.73 cm/m  RA Area:     7.56 cm LA Vol (A2C): 25.2 ml 15.02 ml/m RA Volume:   13.00 ml 7.75 ml/m LA Vol (A4C): 28.8 ml 17.17 ml/m  AORTIC VALVE LVOT Vmax:   84.90 cm/s LVOT Vmean:  60.200 cm/s LVOT VTI:    0.181 m  AORTA Ao Root diam: 3.00 cm MITRAL VALVE MV Area (PHT): 2.91 cm    SHUNTS MV Decel Time: 261 msec    Systemic VTI:  0.18 m MV E velocity: 71.60 cm/s  Systemic Diam: 1.90 cm MV A velocity: 94.30 cm/s MV E/A ratio:  0.76 Hannah Rouge MD Electronically signed by Hannah Rouge MD Signature  Date/Time: 06/11/2020/10:19:14 AM    Final    CT HEAD CODE STROKE WO CONTRAST  Result Date: 06/09/2020 CLINICAL DATA:  Code stroke.  Neuro deficit, acute, stroke suspected EXAM: CT HEAD WITHOUT CONTRAST TECHNIQUE: Contiguous axial images were obtained from the base of the skull through the vertex without intravenous contrast. COMPARISON:  06/17/2018 FINDINGS: Brain: No acute infarct or intracranial hemorrhage. No mass lesion. No midline shift, ventriculomegaly or extra-axial fluid collection. Scattered and confluent supratentorial white matter hypodensities likely reflect sequela of multiple sclerosis. Chronic lacunar insult involving the left internal capsule posterior limb. Vascular: No hyperdense vessel or unexpected calcification. Carotid siphon atherosclerotic calcifications. Skull: Negative for fracture or focal lesion. Sinuses/Orbits: No acute orbital finding. Small sphenoid sinus mucous retention cyst. Other: None. ASPECTS [facility] Stroke Program Early CT Score) - Ganglionic level infarction (caudate, lentiform nuclei, internal capsule, insula, M1-M3 cortex): 7 - Supraganglionic infarction (M4-M6 cortex): 3 Total score (0-10 with 10 being normal): 10 IMPRESSION: 1. No acute intracranial process. Supratentorial demyelinating lesions. 2. Chronic left internal capsule lacunar insult. 3. ASPECTS is 10 Code stroke imaging results were communicated on 06/09/2020 at 8:09 am to provider Dr Curly [place] via secure text paging. Electronically Signed   By: Primitivo Gauze M.D.   On: 06/09/2020 08:11   VAS US CAROTID  Result Date: 06/10/2020 Carotid Arterial Duplex Study Indications:       CVA. Risk Factors:      Hypertension, hyperlipidemia, Diabetes. Comparison Study:  No prior studies. Performing Technologist: Oliver Hum RVT  Examination Guidelines: A complete evaluation includes B-mode imaging, spectral Doppler, color Doppler, and power Doppler as needed of all accessible portions of each vessel. Bilateral  testing is considered an integral part of a complete examination. Limited examinations for reoccurring indications may be performed as noted.  Right Carotid Findings: +----------+--------+--------+--------+-----------------------+--------+           PSV cm/sEDV cm/sStenosisPlaque Description     Comments +----------+--------+--------+--------+-----------------------+--------+ CCA Prox  50      7               smooth and heterogenous         +----------+--------+--------+--------+-----------------------+--------+ CCA Distal79      15              smooth and heterogenous         +----------+--------+--------+--------+-----------------------+--------+ ICA Prox  81      24  smooth and heterogenous         +----------+--------+--------+--------+-----------------------+--------+ ICA Distal61      17                                     tortuous +----------+--------+--------+--------+-----------------------+--------+ ECA       91      9                                               +----------+--------+--------+--------+-----------------------+--------+ +----------+--------+-------+--------+-------------------+           PSV cm/sEDV cmsDescribeArm Pressure (mmHG) +----------+--------+-------+--------+-------------------+ [id number]                                        +----------+--------+-------+--------+-------------------+ +---------+--------+--+--------+--+---------+ VertebralPSV cm/s48EDV cm/s11Antegrade +---------+--------+--+--------+--+---------+  Left Carotid Findings: +----------+--------+--------+--------+-----------------------+--------+           PSV cm/sEDV cm/sStenosisPlaque Description     Comments +----------+--------+--------+--------+-----------------------+--------+ CCA Prox  77      13              smooth and heterogenous         +----------+--------+--------+--------+-----------------------+--------+ CCA  Distal66      12              smooth and heterogenous         +----------+--------+--------+--------+-----------------------+--------+ ICA Prox  84      27              smooth and heterogenoustortuous +----------+--------+--------+--------+-----------------------+--------+ ICA Distal69      20                                     tortuous +----------+--------+--------+--------+-----------------------+--------+ ECA       66      10                                              +----------+--------+--------+--------+-----------------------+--------+ +----------+--------+--------+--------+-------------------+           PSV cm/sEDV cm/sDescribeArm Pressure (mmHG) +----------+--------+--------+--------+-------------------+ Subclavian129                                         +----------+--------+--------+--------+-------------------+ +---------+--------+--+--------+-+---------+ VertebralPSV cm/s44EDV cm/s7Antegrade +---------+--------+--+--------+-+---------+   Summary: Right Carotid: Velocities in the right ICA are consistent with a 1-39% stenosis. Left Carotid: Velocities in the left ICA are consistent with a 1-39% stenosis. Vertebrals: Bilateral vertebral arteries demonstrate antegrade flow. *See table(s) above for measurements and observations.  Electronically signed by Deitra Mayo MD on 06/10/2020 at 4:13:23 PM.    Final     Labs:  Basic Metabolic Panel: Recent Labs  Lab 06/23/20 0646  NA 137  K 4.5  CL 108  CO2 20*  GLUCOSE 78  BUN 27*  CREATININE 3.92*  CALCIUM 8.6*    CBC: Recent Labs  Lab 06/19/20 1013  WBC 7.7  HGB 8.8*  HCT 27.8*  MCV 88.8  PLT 233    CBG:  Recent Labs  Lab 06/25/20 0622 06/25/20 0651 06/25/20 1207 06/25/20 1623 06/25/20 2052  GLUCAP 68* 104* 88 91 215*   Family history.  Positive for hypertension and hyperlipidemia.  Denies any colon cancer esophageal cancer or rectal cancer  Brief HPI:   Hannah [name]  is a 55 y.o. right-handed female with history of hyperlipidemia CKD stage IV self-reported multiple sclerosis diabetes mellitus hypertension multiple CVA with right side residual weakness and slurred speech maintained on aspirin and Plavix.  Patient reportedly has missed multiple medical follow-up appointments.  Per chart review lives with her family.  She has assistance as needed.  Used a Rollator prior to admission.  Family assist with self-care.  Presented 06/09/2020 with dizziness vision change as well as left-sided weakness.  Blood pressure 208/102.  Cranial CT scan negative for acute changes.  MRI showed punctate focus of restricted diffusion within the dorsal pons on the right side.  Scattered and confluent foci of T2 hyperintensity also seen within the white matter of the cerebral hemispheres and pons nonspecific consistent with clinical diagnosis of multiple sclerosis with multiple new lesions.  There were question however these lesions were truly MS.  MRA unremarkable.  Admission chemistries glucose 161 BUN 32 creatinine 4.11 with noted creatinine from February 2021 2.81 alcohol negative.  Patient did not receive TPA.  Currently maintained on aspirin and Plavix for CVA prophylaxis.  Subcutaneous Lovenox for DVT prophylaxis.  Therapy evaluations completed with recommendations of physical medicine rehab consult due to left-sided weakness and facial droop with slurred speech and patient was admitted for a comprehensive rehab program.   Hospital Course: Hannah [name] was admitted to rehab 06/12/2020 for inpatient therapies to consist of PT, ST and OT at least three hours five days a week. Past admission physiatrist, therapy team and rehab RN have worked together to provide customized collaborative inpatient rehab.  Pertaining to patient's acute nonhemorrhagic dorsal right pontine infarction as well as history of multiple CVA in the past with right side spastic weakness and slurred speech.  She continued on  aspirin Plavix prior to admission and follow-up neurology services with plan for aspirin Plavix x3 weeks total then Plavix alone.  She was placed on tie as a Dean 3 times daily for spasticity considering plan for possible Botox as outpatient.  Subcutaneous heparin for DVT prophylaxis transition to Lovenox she was later refusing injections discontinued as patient ambulatory.  Mood stabilization with Prozac she was attending therapies.  Blood pressure control with Norvasc as well as Lopressor and it was stressed the need to follow-up outpatient with PCP.  Blood sugars monitored hemoglobin A1c 8.9 full diabetic teaching completed she had been on Lantus insulin with some hypoglycemic episodes she was transitioned back to her Glucotrol at time of discharge and would need follow-up outpatient with PCP.  Her Tyler Aas remained on hold.  CKD stage IV latest creatinine February 2021 2.81 noted latest creatinine 3.92 she had been on nighttime IV fluids and monitored and again will need follow-up outpatient.  Self-reported multiple sclerosis multiple new lesions on MRI with diagnosis but patient apparently has never been treated she was advised to follow-up neurology services as outpatient.  Crestor ongoing for hyperlipidemia.  Chronic anemia latest hemoglobin 8.8 no bleeding episodes.   Blood pressures were monitored on TID basis and controlled  Diabetes has been monitored with ac/hs CBG checks and SSI was use prn for tighter BS control.    Rehab course: During patient's stay in rehab weekly team conferences were held to  monitor patient's progress, set goals and discuss barriers to discharge. At admission, patient required moderate assist stand pivot transfers moderate assist 6 feet rolling walker mod assist supine to sit max assist sit to supine.  Minimal assist upper body bathing mod assist lower body bathing minimal assist upper body dressing mod assist lower body dressing mod assist toilet transfers  Physical  exam.  Blood pressure 179/81 pulse 82 temperature 98.4 respirations 18 oxygen saturations 98% room air Constitutional.  No acute distress HEENT Head.  Normocephalic and atraumatic Eyes.  Pupils round and reactive to light no discharge.nystagmus Neck.  Supple nontender no JVD without thyromegaly Cardiac regular rate rhythm without extra sounds or murmur heard Abdomen.  Soft nontender positive bowel sounds without rebound Respiratory effort normal no respiratory distress without wheeze Musculoskeletal normal range of motion Neurologic.  Patient in no acute distress speech is a bit dysarthric but intelligible.  Right central 7.  Gaze remains slightly disconjugate although right medial rectus crosses midline.  Still with some diplopia.  Follows commands.  Provides her name age some delay in processing.  Fair insight and awareness.  Right upper extremity 3-23/5 proximal distal, right lower extremity 2/5 hip flexors knee extension 1/5 ankle dorsi plantarflexion.  Right heel cord tight.  Left upper extremity 4 -/5 left lower extremity 4/5.  DTRs 2+ right 2+ left.  Senses pain in all fours  He/She  has had improvement in activity tolerance, balance, postural control as well as ability to compensate for deficits. He/She has had improvement in functional use RUE/LUE  and RLE/LLE as well as improvement in awareness.  Patient demonstrated a sending descending stairs with posterior approach.  Patient sit to stand and short distance gait to curb.  Patient demonstrated safe technique for car transfers.  Propels wheelchair independently.  Ambulates with rolling walker and can navigate around obstacles.  She was able to transfer to edge of bed with supervision for ADLs ambulate to the walk-in shower with minimal assist using rolling walker for support.  She completed undressing with minimal assist as well as completed shower transfers sit to stand at same level.  Patient completed upper body dressing with set up and  lower body dressing minimal assist.  Speech therapy follow-up patient family educated regarding patient's strategies to utilize at home to maximize recall functional information problem-solving attention and overall safety.  Stressed the importance of 24-hour supervision for safety.  Full family teaching completed plan discharge to home       Disposition: Discharge to home    Diet: Carb modified  Special Instructions: No driving smoking or alcohol  Medications at discharge 1.  Tylenol as needed 2.  Norvasc 10 mg p.o. daily 3.  Aspirin 81 mg p.o. daily x6 days and stop 4.  Plavix and 5 mg p.o. daily 5.  Pepcid 20 mg p.o. daily 6.  Prozac 20 mg p.o. daily 7.  Hydralazine 25 mg p.o. every 8 hours 8.  Glucotrol 10 mg twice daily 9.  Melatonin 3 mg nightly 10.  Lopressor 100 mg p.o. twice daily 11.  Nitroglycerin as needed 12.  Crestor 20 mg p.o. daily 13.Voltaren gel 2% QID 14.Colace 100 mg BID  30-35 minutes were spent completing discharge summary and discharge planning  Discharge Instructions    Ambulatory referral to Neurology   Complete by: As directed    An appointment is requested in approximately 4 weeks nonhemorrhagic dorsal right pontine infarction   Ambulatory referral to Physical Medicine Rehab   Complete by: As  directed    Moderate complexity follow-up 1 to 2 weeks nonhemorrhagic dorsal right pontine infarction       Follow-up Information    Kirsteins, Luanna Salk, MD Follow up.   Specialty: Physical Medicine and Rehabilitation Why: Office to call for appointment Contact information: [place] [address] [phone]               Signed: Cathlyn Parsons 06/26/2020, 5:17 AM

## 2020-06-24 NOTE — Significant Event (Signed)
Hypoglycemic Event  CBG:55 Treatment: 4 oz juice/soda  Symptoms: None  Follow-up CBG: Time: 0617 CBG Result:97  Possible Reasons for Event: Unknown  Comments/MD notified   [facility]

## 2020-06-24 NOTE — Progress Notes (Signed)
Patient ID: Hannah [name], female   DOB: 1966/05/14, 55 y.o.   MRN: [id number]  [facility] options presented to patient on 06/23/2020.  [place], [place]

## 2020-06-24 NOTE — Progress Notes (Signed)
Physical Therapy Session Note  Patient Details  Name: Hannah [name] MRN: [id number] Date of Birth: 1966-05-10  Today's Date: 06/24/2020 PT Individual Time: 0910-1007 PT Individual Time Calculation (min): 57 min   Short Term Goals: Week 2:  PT Short Term Goal 1 (Week 2): = to LTGs based on ELOS  Skilled Therapeutic Interventions/Progress Updates:    Session 1: Pt received sitting in power wheelchair and agreeable to therapy session though reporting some fatigue at this time - pt associates this with her medication but states "I can handle it." Power w/c mobility out of her room and to therapy gym with supervision - pt demos improved ability to navigate using this loaner chair with improved control and improved responsiveness of the joy stick. Pt able to back up chair in an opening of the therapy gym with cuing for monitoring objects on both sides as backing. Sit<>stands using RW with CGA during session. Gait training ~31f using RW with CGA for steadying - pt demos good R LE hip/knee flexion during swing with good foot clearance although has some spasticity/tone causing some "catching" throughout the movement. Pt reports some fatigue after ambulating and provided therapeutic rest break - discussed importance of rest breaks with activity at home to manage MS related fatigue. Educated pt on [employer] find additional information/education regarding MS symptoms and expectations with disease progression. Participated in 1 set of the following exercises and provided pt with a printout of the exercises for her HEP. Therapist providing cuing for proper form/technique. Pt performed supine<>sit on mat with supervision. At end of session therapist transported pt back to room for time management. Pt requesting to remain in w/c at this time and pt sitting with needs in reach and seat belt on.  Access Code: [email or web address] URL: [email or web address] Date: 06/24/2020 Prepared by: CPage Spiro Exercises Sit to Stand with Counter Support - 1 x daily - 7 x weekly - 3 sets - 5 reps Seated Hamstring Stretch with Strap - 1 x daily - 7 x weekly - 2 sets - 2 reps - 1 minute hold Supine Bridge - 1 x daily - 7 x weekly - 2 sets - 8 reps Standing Forward Step Taps with Counter Support - 1 x daily - 7 x weekly - 2 sets - 10 reps Narrow Stance with Counter Support - 1 x daily - 7 x weekly - 2 sets - 30 seconds hold   Session 2: Pt received supine in bed, resting stating she has been feeling nauseous since receiving a medication to assist with having a BM. Pt politely declines therapy at this time and requests to rest. Missed 30 minutes of skilled physical therapy.  Therapy Documentation Precautions:  Precautions Precautions: Fall Precaution Comments: R spastic hemiparesis in UE/LE Restrictions Weight Bearing Restrictions: No  Pain: Session 1: Denies pain during session.  Therapy/Group: Individual Therapy  CTawana Scale, PT, DPT, CSRS  06/24/2020, 7:48 AM

## 2020-06-24 NOTE — Progress Notes (Signed)
Occupational Therapy Session Note  Patient Details  Name: Hannah [name] MRN: [id number] Date of Birth: November 16, 1965  Today's Date: 06/24/2020 OT Individual Time: [id number] OT Individual Time Calculation (min): 57 min    Short Term Goals: Week 2:  OT Short Term Goal 1 (Week 2): Continue working on established LTGs set at supervision to min guard assist overall.  Skilled Therapeutic Interventions/Progress Updates:    Pt in bed to start session and then completed supine to sit with supervision and then toilet transfer with min guard assist using the RW for support.  Increased right knee flexion and increased right foot planter flexion noted.  She completed toileting with min guard assist and then ambulated out to the sink for grooming tasks in standing.  Supervision for washing hands and then completing oral hygiene.  She was able to brief with min assist and then donned pants and pullover shirt with supervision.  Had her transfer to the power wheelchair and then she was able to drive herself down to the high/low table in the day room while sitting.  She worked on using the RUE to pick up clothespins from the container and place on the perpendicular rod.  She completed placing the yellow and red, but needed min assist to place the green above head level.  She was able to take them off at the same level with rest breaks.  Finished session with return to the room and pt left sitting in the wheelchair with the call button and phone in reach and safety belt in place.      Therapy Documentation Precautions:  Precautions Precautions: Fall Precaution Comments: R spastic hemiparesis in UE/LE Restrictions Weight Bearing Restrictions: No  Pain: Pain Assessment Pain Scale: 0-10 Pain Score: 0-No pain ADL: See Care Tool Section for some details of mobility and selfcare  Therapy/Group: Individual Therapy  Darlinda Bellows OTR/L 06/24/2020, 12:13 PM

## 2020-06-25 LAB — GLUCOSE, CAPILLARY
Glucose-Capillary: 68 mg/dL — ABNORMAL LOW (ref 70–99)
Glucose-Capillary: 104 mg/dL — ABNORMAL HIGH (ref 70–99)
Glucose-Capillary: 88 mg/dL (ref 70–99)
Glucose-Capillary: 91 mg/dL (ref 70–99)
Glucose-Capillary: 215 mg/dL — ABNORMAL HIGH (ref 70–99)

## 2020-06-25 MED ORDER — DICLOFENAC SODIUM 1 % EX GEL: 2.0000 g | Freq: Four times a day (QID) | CUTANEOUS | 0 refills | Status: DC

## 2020-06-25 MED ORDER — MELATONIN 3 MG PO TABS: 3.0000 mg | ORAL_TABLET | Freq: Every day | ORAL | 0 refills | Status: DC

## 2020-06-25 MED ORDER — TIZANIDINE HCL 2 MG PO TABS: 2.0000 mg | ORAL_TABLET | Freq: Three times a day (TID) | ORAL | 0 refills | Status: DC

## 2020-06-25 MED ORDER — ASPIRIN 81 MG PO TBEC: 81.0000 mg | DELAYED_RELEASE_TABLET | Freq: Every day | ORAL | 11 refills | Status: AC

## 2020-06-25 MED ORDER — HYDRALAZINE HCL 25 MG PO TABS: 25.0000 mg | ORAL_TABLET | Freq: Three times a day (TID) | ORAL | 0 refills | Status: DC

## 2020-06-25 MED ORDER — CLOPIDOGREL BISULFATE 75 MG PO TABS: 75.0000 mg | ORAL_TABLET | Freq: Every day | ORAL | 1 refills | Status: DC

## 2020-06-25 MED ORDER — ROSUVASTATIN CALCIUM 20 MG PO TABS: 20.0000 mg | ORAL_TABLET | Freq: Every day | ORAL | 0 refills | Status: DC

## 2020-06-25 MED ORDER — FLUOXETINE HCL 20 MG PO CAPS: 20.0000 mg | ORAL_CAPSULE | Freq: Every day | ORAL | 3 refills | Status: DC

## 2020-06-25 MED ORDER — DICLOFENAC SODIUM 1 % EX GEL
2.0000 g | Freq: Four times a day (QID) | CUTANEOUS | Status: DC
  Administered 2020-06-25 – 2020-06-26 (×3): 2 g via TOPICAL
  Filled 2020-06-25: qty 100

## 2020-06-25 MED ORDER — AMLODIPINE BESYLATE 10 MG PO TABS: 10.0000 mg | ORAL_TABLET | Freq: Every day | ORAL | 0 refills | Status: DC

## 2020-06-25 MED ORDER — FAMOTIDINE 20 MG PO TABS: 20.0000 mg | ORAL_TABLET | Freq: Every day | ORAL | 0 refills | Status: DC

## 2020-06-25 MED ORDER — NITROGLYCERIN 0.4 MG SL SUBL
0.4000 mg | SUBLINGUAL_TABLET | SUBLINGUAL | 12 refills | Status: AC | PRN
Start: 2020-06-25 — End: ?

## 2020-06-25 MED ORDER — INSULIN GLARGINE 100 UNIT/ML ~~LOC~~ SOLN
13.0000 [IU] | Freq: Every day | SUBCUTANEOUS | Status: DC
  Administered 2020-06-25: 13 [IU] via SUBCUTANEOUS
  Filled 2020-06-25 (×2): qty 0.13

## 2020-06-25 MED ORDER — ACETAMINOPHEN 325 MG PO TABS: 650.0000 mg | ORAL_TABLET | ORAL | Status: DC | PRN

## 2020-06-25 MED ORDER — GLIPIZIDE 10 MG PO TABS: 10.0000 mg | ORAL_TABLET | Freq: Two times a day (BID) | ORAL | 0 refills | Status: DC

## 2020-06-25 MED ORDER — METOPROLOL TARTRATE 100 MG PO TABS: 100.0000 mg | ORAL_TABLET | Freq: Two times a day (BID) | ORAL | 0 refills | Status: DC

## 2020-06-25 NOTE — Discharge Instructions (Signed)
Inpatient Rehab Discharge Instructions  Hannah [name] Discharge date and time: No discharge date for patient encounter.   Activities/Precautions/ Functional Status: Activity: activity as tolerated Diet: diabetic diet Wound Care: Routine skin checks Functional status:  ___ No restrictions     ___ Walk up steps independently ___ 24/7 supervision/assistance   ___ Walk up steps with assistance ___ Intermittent supervision/assistance  ___ Bathe/dress independently ___ Walk with walker     _x__ Bathe/dress with assistance ___ Walk Independently    ___ Shower independently ___ Walk with assistance    ___ Shower with assistance ___ No alcohol     ___ Return to work/school ________  COMMUNITY REFERRALS UPON DISCHARGE:    Home Health:   PT     OT                     Agency: [facility]  Phone: [phone]   Special Instructions: No driving smoking or alcohol  Continue aspirin 81 mg daily x6 more days and stop  Follow-up with primary care physician regards to maintenance of diabetes   STROKE/TIA DISCHARGE INSTRUCTIONS SMOKING Cigarette smoking nearly doubles your risk of having a stroke & is the single most alterable risk factor  If you smoke or have smoked in the last 12 months, you are advised to quit smoking for your health.  Most of the excess cardiovascular risk related to smoking disappears within a year of stopping.  Ask you doctor about anti-smoking medications  [place] Quit Line: 1-800-QUIT NOW  Free Smoking Cessation Classes [phone]  CHOLESTEROL Know your levels; limit fat & cholesterol in your diet  Lipid Panel     Component Value Date/Time   CHOL 133 06/10/2020 0543   TRIG 113 06/10/2020 0543   HDL 54 06/10/2020 0543   CHOLHDL 2.5 06/10/2020 0543   VLDL 23 06/10/2020 0543   LDLCALC 56 06/10/2020 0543      Many patients benefit from treatment even if their cholesterol is at goal.  Goal: Total Cholesterol (CHOL) less than 160  Goal:  Triglycerides  (TRIG) less than 150  Goal:  HDL greater than 40  Goal:  LDL (LDLCALC) less than 100   BLOOD PRESSURE American Stroke Association blood pressure target is less that 120/80 mm/Hg  Your discharge blood pressure is:  BP: (!) 173/80  Monitor your blood pressure  Limit your salt and alcohol intake  Many individuals will require more than one medication for high blood pressure  DIABETES (A1c is a blood sugar average for last 3 months) Goal HGBA1c is under 7% (HBGA1c is blood sugar average for last 3 months)  Diabetes:    Lab Results  Component Value Date   HGBA1C 8.9 (H) 06/10/2020     Your HGBA1c can be lowered with medications, healthy diet, and exercise.  Check your blood sugar as directed by your physician  Call your physician if you experience unexplained or low blood sugars.  PHYSICAL ACTIVITY/REHABILITATION Goal is 30 minutes at least 4 days per week  Activity: Increase activity slowly, Therapies: Physical Therapy: Home Health Return to work:   Activity decreases your risk of heart attack and stroke and makes your heart stronger.  It helps control your weight and blood pressure; helps you relax and can improve your mood.  Participate in a regular exercise program.  Talk with your doctor about the best form of exercise for you (dancing, walking, swimming, cycling).  DIET/WEIGHT Goal is to maintain a healthy weight  Your discharge  diet is:  Diet Order            Diet heart healthy/carb modified Room service appropriate? Yes; Fluid consistency: Thin  Diet effective ____                 liquids Your height is:  Height: '5\' 2"'$  (157.5 cm) Your current weight is: Weight: 65.4 kg Your Body Mass Index (BMI) is:  BMI (Calculated): 26.36  Following the type of diet specifically designed for you will help prevent another stroke.  Your goal weight range is:    Your goal Body Mass Index (BMI) is 19-24.  Healthy food habits can help reduce 3 risk factors for stroke:  High  cholesterol, hypertension, and excess weight.  RESOURCES Stroke/Support Group:  Call [phone]   STROKE EDUCATION PROVIDED/REVIEWED AND GIVEN TO PATIENT Stroke warning signs and symptoms How to activate emergency medical system (call 911). Medications prescribed at discharge. Need for follow-up after discharge. Personal risk factors for stroke. Pneumonia vaccine given:  Flu vaccine given:  My questions have been answered, the writing is legible, and I understand these instructions.  I will adhere to these goals & educational materials that have been provided to me after my discharge from the hospital.      My questions have been answered and I understand these instructions. I will adhere to these goals and the provided educational materials after my discharge from the hospital.  Patient/Caregiver Signature _______________________________ Date __________  Clinician Signature _______________________________________ Date __________  Please bring this form and your medication list with you to all your follow-up doctor's appointments. LOCAL END[facility]OLOGISTS [facility] [phone]) 1. Dr. Philemon Kingdom 2. Dr. Elayne Snare 3. Abby Shamleffer 4. Dr. Wende Mott [facility] [phone]) 1. Dr. Delrae Rend [facility] [phone]) 1. Dr. Jacelyn Pi 2. Dr. Madelin Rear [facility] [phone]) 1. Dr. Daneil Dolin [facility] [phone]) [[place] office]  [phone]) [[place] office] 1. Dr. Lenna Sciara [place] 2. Dr. Mee Hives [facility]) [phone]) 1. Autumn Hudnall Ronnald Ramp), PA 2. Dr. Amalia Greenhouse 3. Dr. Marsh Dolly. [facility] [phone]) Dr. Glade Lloyd

## 2020-06-25 NOTE — Progress Notes (Signed)
Occupational Therapy Discharge Summary  Patient Details  Name: Hannah [name] MRN: [id number] Date of Birth: Oct 05, 1965  Today's Date: 06/25/2020 OT Individual Time: 9211-9417 OT Individual Time Calculation (min): 75 min   Session Note:  Pt completed supine to sit with supervision to begin session without use of the elevated bed or bedrail.  She was then able to donn her pants, socks, and slip on shoes with supervision.  She then completed transfer to the power wheelchair with min guard assist using the RW for support.  She was able to complete oral hygiene at the sink with independence from the power wheelchair.  Next, had her drive herself down to the therapy gym with supervision to avoid obstacles on both the right and left at times.  She completed stand pivot transfer to the therapy mat with min assist and no assistive device.  Therapist provided education, demonstration, and handouts for FM coordination activities as well as shoulder stretches for the LUE.  Pt was able to voice understanding and return demonstrate completion of a couple of the exercises.  Finished session with transfer back to the wheelchair and return to the room at supervision level for backing up beside of her bed and for avoiding doorways.  Call button and phone in reach at end of session.    Patient has met 11 of 11 long term goals due to improved activity tolerance, improved balance, ability to compensate for deficits, functional use of  RIGHT upper and RIGHT lower extremity, improved attention and improved coordination.  Patient to discharge at [facility] Assist level.  Patient's care partner is independent to provide the necessary physical and cognitive assistance at discharge.    Reasons goals not met: NA  Recommendation:  Patient will benefit from ongoing skilled OT services in home health setting to continue to advance functional skills in the area of BADL and Reduce care partner burden.  Feel pt will benefit from  continued [place] for further progression of ADLs to a possible modified independent level as well as increasing functional use of the LUE.    Equipment: No equipment provided  Reasons for discharge: treatment goals met and discharge from hospital  Patient/family agrees with progress made and goals achieved: Yes  OT Discharge Precautions/Restrictions  Precautions Precautions: Fall Precaution Comments: R spastic hemiparesis in UE/LE Restrictions Weight Bearing Restrictions: No  Pain Pain Assessment Pain Scale: Faces Pain Score: 0-No pain ADL ADL Eating: Independent Where Assessed-Eating: Wheelchair Grooming: Independent Where Assessed-Grooming: Wheelchair Upper Body Bathing: Supervision/safety Where Assessed-Upper Body Bathing: Shower,Chair Lower Body Bathing: Supervision/safety Where Assessed-Lower Body Bathing: Shower,Chair Upper Body Dressing: Supervision/safety Where Assessed-Upper Body Dressing: Wheelchair Lower Body Dressing: Minimal assistance Where Assessed-Lower Body Dressing: Wheelchair Toileting: Supervision/safety Where Assessed-Toileting: Bedside Commode Toilet Transfer: Therapist, music Method: Counselling psychologist: Bedside commode Tub/Shower Transfer: Geologist, engineering: Facilities manager: Curator Method: Heritage manager: Gaffer Baseline Vision/History: Wears glasses Wears Glasses: Reading only Patient Visual Report: Blurring of vision Vision Assessment?: Yes Eye Alignment: Within Functional Limits Ocular Range of Motion: Within Functional Limits Alignment/Gaze Preference: Within Defined Limits Tracking/Visual Pursuits: Decreased smoothness of horizontal tracking;Decreased smoothness of vertical tracking Saccades: Within functional limits Convergence: Within functional limits Visual Fields: Right visual field  deficit;Left visual field deficit (Pt with bilateral peripheral field deficits) Perception  Perception: Impaired Inattention/Neglect: Does not attend to right visual field Praxis Praxis: Intact Cognition Overall Cognitive Status: Impaired/Different from baseline Arousal/Alertness: Awake/alert  Orientation Level: Oriented X4 Attention: Focused;Selective;Sustained Focused Attention: Appears intact Sustained Attention: Appears intact Selective Attention: Impaired Selective Attention Impairment: Functional complex;Verbal complex Memory: Impaired Memory Impairment: Decreased recall of new information Awareness: Appears intact Problem Solving: Impaired Problem Solving Impairment: Functional complex Reasoning: Appears intact Self Correcting: Appears intact Safety/Judgment: Appears intact Sensation Sensation Light Touch: Appears Intact Hot/Cold: Appears Intact Proprioception: Appears Intact Stereognosis: Not tested Additional Comments: Sensation intact in BUEs Coordination Gross Motor Movements are Fluid and Coordinated: No Fine Motor Movements are Fluid and Coordinated: No Coordination and Movement Description: Pt currently uses the RUE at a diminshed level for selfcare tasks.  Decreased FM coordination noted with slight synergy pattern noted with shoulder flexion. Motor  Motor Motor: Hemiplegia;Abnormal tone;Abnormal postural alignment and control Motor - Discharge Observations: Pt still with RUE and RLE hemiparesis Mobility  Bed Mobility Bed Mobility: Rolling Right;Right Sidelying to Sit Rolling Right: Supervision/verbal cueing Right Sidelying to Sit: Supervision/Verbal cueing Supine to Sit: Supervision/Verbal cueing Transfers Sit to Stand: Supervision/Verbal cueing Stand to Sit: Supervision/Verbal cueing  Trunk/Postural Assessment  Cervical Assessment Cervical Assessment: Exceptions to [facility] (forward head) Thoracic Assessment Thoracic Assessment: Exceptions to [facility]  (thoracic kyphosis) Lumbar Assessment Lumbar Assessment: Exceptions to [facility] (posterior pelvic tilt)  Balance Balance Balance Assessed: Yes Static Sitting Balance Static Sitting - Balance Support: Feet supported Static Sitting - Level of Assistance: 7: Independent Dynamic Sitting Balance Dynamic Sitting - Balance Support: During functional activity Dynamic Sitting - Level of Assistance: 5: Stand by assistance Static Standing Balance Static Standing - Balance Support: During functional activity Static Standing - Level of Assistance: 5: Stand by assistance Dynamic Standing Balance Dynamic Standing - Balance Support: During functional activity Dynamic Standing - Level of Assistance:  (min guard) Extremity/Trunk Assessment RUE Assessment RUE Assessment: Exceptions to [facility] Passive Range of Motion (PROM) Comments: shoulder flexion 0-120 degrees with end range pain.  Elbow flexion/extension WFLs, digits WFLS Active Range of Motion (AROM) Comments: Brunnstrum stage IV in the arm with stage V in the hand. General Strength Comments: Pt will full gross grasp and release present with ability to oppose the thumb to the first and second digits.  Still with synergy pattern present with shoulder flexion as well as slight increased tone in the elbow, but she is able to actively extend to [facility].  Currently using the RUE at a diminshed level during selfcare tasks. LUE Assessment LUE Assessment: Within Functional Limits   Brittany Osier OTR/L 06/25/2020, 12:50 PM

## 2020-06-25 NOTE — Progress Notes (Signed)
Hypoglycemic Event  CBG: 68 Treatment: 4oz juice Symptoms: Lethargic Follow-up CBG: Time: 0615 CBG Result:103 Possible Reasons for Event: Unknown    Comments/MD notified:    Hannah [name]

## 2020-06-25 NOTE — Progress Notes (Signed)
Patient ID: Hannah [name], female   DOB: January 19, 1966, 55 y.o.   MRN: [id number] Team Conference Report to Patient/Family  Team Conference discussion was reviewed with the patient and caregiver, including goals, any changes in plan of care and target discharge date.  Patient and caregiver express understanding and are in agreement.  The patient has a target discharge date of 06/26/20.  Dyanne Iha 06/25/2020, 12:56 PM

## 2020-06-25 NOTE — Progress Notes (Signed)
Physical Therapy Discharge Summary  Patient Details  Name: Hannah [name] MRN: [id number] Date of Birth: [birth year]  Today's Date: 06/25/2020 PT Individual Time: 1104-1202 and 7510-2585 PT Individual Time Calculation (min): 58 min and 32 min   Patient has met 10 of 10 long term goals due to improved activity tolerance, improved balance, improved postural control, increased strength, increased range of motion, ability to compensate for deficits, functional use of  right upper extremity and right lower extremity, improved attention, improved awareness and improved coordination.  Patient to discharge at a power wheelchair level supervision performing stand pivot transfers using RW with CGA. Patient's care partner attended hands-on education/training and is independent to provide the necessary physical and cognitive assistance at discharge.  All goals met.  Recommendation:  Patient will benefit from ongoing skilled PT services in home health setting to continue to advance safe functional mobility, address ongoing impairments in static and dynamic standing balance, cardiovascular endurance, R LE strength, gait training with LRAD, and minimize fall risk.  Equipment: power wheelchair through [employer], pt has all other necessary DME  Reasons for discharge: treatment goals met and discharge from hospital  Patient/family agrees with progress made and goals achieved: Yes  Skilled Therapeutic Interventions/Progress Updates:  Session 1: Pt received sitting in power w/c and agreeable to therapy session. Power w/c mobility on indoor setting ~147f to main therapy gym and ~1270fto ortho gym during session with supervision - pt demoing improving navigation and awareness for visual scanning through doorways and more narrow spaces. Gait training 8840fsing RW with CGA - continues to demo improved RLE hip/knee flexion during swing for improved foot clearance and although improving impaired motor coordination  throughout swing - continues with decreased gait speed and limited distance due to MS related fatigue. Ascended/descended 4 steps using B HRs with CGA for steadying and cuing for step-to pattern for increased safety. Stepped up/down on/off 8" step using RW 2x with min assist progressing to CGA for steadying - pt demos good safety awareness and proper sequencing of AD. Simulated car transfer using RW with CGA for safety and pt able to recall proper technique without cuing. Transported back to room and let sitting in power w/c with needs in reach and seat belt alarm on.  Session 2: Pt received sitting in power w/c with LisLattie Hawec Therapist, present and pt agreeable to therapy session.  Transported to/from gym in w/c for time management. [facility] demonstrates increased fall risk as noted by score of   21/56; however, a significant improvement compared to 4/56 only 1 week prior. (<36= high risk for falls, close to 100%; 37-45 significant >80%; 46-51 moderate >50%; 52-55 lower >25%). Pt continues to demonstrate fear of falling and increased postural sway during static standing with very guarded posturing. Pt denies any questions/concerns regarding upcoming D/C and reports she is excited to go home. Reinforced education on HEP and importance of ambulating with daughter's assistance daily for exercise. At end of session pt left sitting in power w/c with seat belt on and LisLattie Hawesent.  PT Discharge Precautions/Restrictions Precautions Precautions: Other (comment);Fall Precaution Comments: R spastic hemiparesis in UE/LE Restrictions Weight Bearing Restrictions: No Pain Pain Assessment Pain Scale: 0-10 Pain Score: 0-No pain Perception  Perception Perception: Impaired Inattention/Neglect: Does not attend to right visual field (improved although mild impairment still present) Praxis Praxis: Intact  Cognition  Overall Cognitive Status: Impaired/Different from  baseline Arousal/Alertness: Awake/alert Orientation Level: Oriented X4 Attention: Focused Focused Attention: Appears intact  Selective Attention: Impaired Memory: Impaired Awareness: Appears intact Problem Solving: Impaired Safety/Judgment: Appears intact Sensation Sensation Light Touch: Appears Intact Hot/Cold: Not tested Proprioception: Appears Intact Stereognosis: Not tested Coordination Gross Motor Movements are Fluid and Coordinated: No Coordination and Movement Description: GM movements impaired due to R LE hypertonicity and paresis Motor  Motor Motor: Hemiplegia;Abnormal tone;Abnormal postural alignment and control Motor - Discharge Observations: Pt still with RUE and RLE hemiparesis  Mobility Bed Mobility Bed Mobility: Supine to Sit;Sit to Supine Supine to Sit: Supervision/Verbal cueing Sit to Supine: Supervision/Verbal cueing Transfers Transfers: Sit to Stand;Stand to Sit;Stand Pivot Transfers Sit to Stand: Supervision/Verbal cueing Stand to Sit: Supervision/Verbal cueing Stand Pivot Transfers: Contact Guard/Touching assist Transfer (Assistive device): Rolling walker Locomotion  Gait Ambulation: Yes Gait Assistance: Contact Guard/Touching assist Gait Distance (Feet): 100 Feet Assistive device: Rolling walker Gait Gait: Yes Gait Pattern: Impaired Gait Pattern: Decreased hip/knee flexion - right;Wide base of support Gait velocity: decreased Stairs / Additional Locomotion Stairs: Yes Stairs Assistance: Contact Guard/Touching assist Stair Management Technique: Two rails Number of Stairs: 4 Height of Stairs: 6 Ramp: Contact Guard/touching assist Curb: Nurse, mental health Mobility: Yes (power wheelchair) Wheelchair Assistance: Supervision/Verbal cueing Wheelchair Propulsion: Left upper extremity (joystick) Wheelchair Parts Management: Needs assistance Distance: 369f  Trunk/Postural Assessment  Cervical  Assessment Cervical Assessment: Within Functional Limits Thoracic Assessment Thoracic Assessment: Within Functional Limits Lumbar Assessment Lumbar Assessment: Exceptions to [facility](posterior [facility] tilt) Postural Control Postural Control: Deficits on evaluation Righting Reactions: delayed and inadequate requiring RW Protective Responses: delayed and inadequate requiring RW  Balance Balance Balance Assessed: Yes Standardized Balance Assessment Standardized Balance Assessment: Berg Balance Test Berg Balance Test Sit to Stand: Able to stand  independently using hands Standing Unsupported: Able to stand 2 minutes with supervision Sitting with Back Unsupported but Feet Supported on Floor or Stool: Able to sit safely and securely 2 minutes Stand to Sit: Uses backs of legs against chair to control descent Transfers: Able to transfer with verbal cueing and /or supervision Standing Unsupported with Eyes Closed: Able to stand 10 seconds with supervision Standing Ubsupported with Feet Together: Needs help to attain position but able to stand for 30 seconds with feet together From Standing, Reach Forward with Outstretched Arm: Reaches forward but needs supervision From Standing Position, Pick up Object from Floor: Unable to try/needs assist to keep balance From Standing Position, Turn to Look Behind Over each Shoulder: Needs assist to keep from losing balance and falling Turn 360 Degrees: Needs assistance while turning Standing Unsupported, Alternately Place Feet on Step/Stool: Needs assistance to keep from falling or unable to try Standing Unsupported, One Foot in Front: Able to take small step independently and hold 30 seconds Standing on One Leg: Unable to try or needs assist to prevent fall Total Score: 21 Static Sitting Balance Static Sitting - Balance Support: Feet supported Static Sitting - Level of Assistance: 7: Independent Dynamic Sitting Balance Dynamic Sitting - Balance Support:  During functional activity Dynamic Sitting - Level of Assistance: 5: Stand by assistance Static Standing Balance Static Standing - Balance Support: During functional activity Static Standing - Level of Assistance: 5: Stand by assistance Dynamic Standing Balance Dynamic Standing - Balance Support: During functional activity;Bilateral upper extremity supported Dynamic Standing - Level of Assistance: Other (comment) (CGA) Extremity Assessment      RLE Assessment RLE Assessment: Exceptions to [facility] Range of Motion (PROM) Comments: decreased ankle DF ROM RLE Strength Right Hip Flexion: 3/5 Right Hip ABduction: 2+/5 Right Hip  ADduction: 3-/5 Right Knee Flexion: 3/5 (increased strength towards end range but limited strength at 90degrees of flexion) Right Knee Extension: 3+/5 Right Ankle Dorsiflexion: 1/5 Right Ankle Plantar Flexion: 2-/5 LLE Assessment LLE Assessment: Within Functional Limits Active Range of Motion (AROM) Comments: WFL LLE Strength Left Hip Flexion: 4+/5 Left Hip ABduction: 4+/5 Left Hip ADduction: 4+/5 Left Knee Flexion: 4+/5 Left Knee Extension: 4+/5 Left Ankle Dorsiflexion: 4+/5 Left Ankle Plantar Flexion: 4+/5    Tawana Scale , PT, DPT, CSRS  06/25/2020, 7:54 AM

## 2020-06-25 NOTE — Patient Care Conference (Signed)
Inpatient RehabilitationTeam Conference and Plan of Care Update Date: 06/25/2020   Time: 10:35 AM    Patient Name: Hannah [name]      Medical Record Number: [id number]  Date of Birth: October 20, 1965 Sex: Female         Room/Bed: 4W13C/4W13C-01 Payor Info: Payor: [employer] MEDICARE / Plan: [facility] MEDICARE / Product Type: *No Product type* /    Admit Date/Time:  06/12/2020  3:33 PM  Primary Diagnosis:  Right pontine cerebrovascular accident [facility])  Hospital Problems: Principal Problem:   Right pontine cerebrovascular accident [facility]) Active Problems:   Acute blood loss anemia   Chronic kidney disease (CKD), stage IV (severe) ([place])   Uncontrolled type 2 diabetes mellitus with hyperglycemia [facility])   Essential hypertension    Expected Discharge Date: Expected Discharge Date: 06/26/20  Team Members Present: Physician leading conference: Dr. Leeroy Cha Care Coodinator Present: Dorien Chihuahua, RN, BSN, CRRN;Christina Sampson Goon, BSW Nurse Present: Rayne Du, LPN PT Present: Page Spiro, PT OT Present: Clyda Greener, OT SLP Present: Jettie Booze, CF-SLP PPS Coordinator present : Gunnar Fusi, SLP     Current Status/Progress Goal Weekly Team Focus  Bowel/Bladder   Pt. is cont of b/b w/ occasional incont.  Pt will regain continence.  Toilet Pt every 2 hrs/ PRN.   Swallow/Nutrition/ Hydration             ADL's   Supervision for UB bathing and dressin, min guard assist for LB bathing with min assist for LB dressing.  Min guard for toilet transfers and toileting tasks.  Currently using the RUE at a diminshed level.  Still Brunnstrum stage V in the arm and hand.  min to min guard  selfcare retraining, baalnce retraining, transfer training, neuromuscular re-education, AE/DME education, pt/family education   Mobility   supervision bed mobility, CGA sit<>stand and stand pivot transfers using RW, gait up to 82f using RW with CGA, loaner power wheelchair delivered yesterday to allow pt  increased independence with functional mobility in the home and community due to progressive nature of MS diagnosis and impaired functional mobility level  CGA overall  activity tolerance, bed mobility, transfer training, gait training, pt/family education, power wheelchair mobility, standing balance and endurance, R LE NMR   Communication             Safety/Cognition/ Behavioral Observations  Mod I-supervision  Mod I  complex problem solving, recall with aids/strategies   Pain   Pt does not report any pain currently.  Pain will remain <3 on scale 1-10.  Assess pain every shift/PRN.   Skin   Skin is intact.  Skin will remain intact.  Assess skin every shift and PRN.     Discharge Planning:  Discharging home with daughters to provide 24/7   Team Discussion: Patient notes appears medication ordered for tone was helpful as noted improved range and flexion; still sone rone right LE and requires ocassional assist with the right side and reports bilateral peripheral vision deficits.  Patient on target to meet rehab goals: yes, currently supervision for bathing and dressing. On target to meet goals.  *See Care Plan and progress notes for long and short-term goals.   Revisions to Treatment Plan:  SLP upgraded goals for cognition to mod I level Teaching Needs: Family education completed  Current Barriers to Discharge: None  Possible Resolutions to Barriers: Equipment ordered and specialized wheelchair to be delivered 06/26/20     Medical Summary Current Status: Constipation resolved with magnesium citrate, nausea resolved with Zofran, has  pain in right hand    Barriers to Discharge Comments: Pain and tightness in right hand, hypoglycemic for 2 days Possible Resolutions to [employer] Focus: decrease Lantus, provided dietary education, added dlicofenac gel for pain in right hand and educated regarding potential side effects, insomnia has improved with melatonin   Continued Need for  Acute Rehabilitation Level of Care: The patient requires daily medical management by a physician with specialized training in physical medicine and rehabilitation for the following reasons: Direction of a multidisciplinary physical rehabilitation program to maximize functional independence : Yes Medical management of patient stability for increased activity during participation in an intensive rehabilitation regime.: Yes Analysis of laboratory values and/or radiology reports with any subsequent need for medication adjustment and/or medical intervention. : Yes   I attest that I was present, lead the team conference, and concur with the assessment and plan of the team.   Dorien Chihuahua B 06/25/2020, 1:26 PM

## 2020-06-25 NOTE — Progress Notes (Signed)
Speech Language Pathology Discharge Summary  Patient Details  Name: Hannah [name] MRN: [id number] Date of Birth: Sep 26, 1965  Today's Date: 06/25/2020 SLP Individual Time: 0730-0812 SLP Individual Time Calculation (min): 42 min   Skilled Therapeutic Interventions: Pt was seen for skilled ST targeting cognitive goals. SLP facilitated session with administration of the Cognistat standardized assessment as means of post-test for this admission. Pt scored WFL for all subtests with exception of similarities/abstract thinking (3-mild impairment) and visual construction (4-mild impairment). She was Mod I for selective attention to tasks, anticipating her d/c needs, and recall of therapists' names she has worked with while inpatient. Pt left laying in bed with alarm set and needs within reach. Continue per current plan of care.   Patient has met 4 of 4 long term goals.  Patient to discharge at overall Modified Independent level.  Reasons goals not met:     Clinical Impression/Discharge Summary:   Pt made functional gains and met 4out of 4 long term goals this admission. Pt is currently Mod I for familiar complex tasks but closer to Supervision A is required for more novel complex tasks due to mild cognitive impairments in awareness, problem solving, recall, and abstract thinking. She demonstrates excellent safety awareness but would benefit from intermittent supervision at home. Pt also with baseline dysarthria s/p previous strokes, although 90-95% intelligible in conversation Mod I. Pt has demonstrated improved overall cognitive processing, problem solving, selective attention, and functional recall. However, given remaining higher level deficits still present, recommend pt continue to receive skilled ST services upon discharge. Pt and family education is complete at this time.    Care Partner:  Caregiver Able to Provide Assistance: Yes  Type of Caregiver Assistance: Cognitive;Physical  Recommendation:   Home Health SLP  Rationale for SLP Follow Up: Reduce caregiver burden;Maximize cognitive function and independence;Maximize functional communication   Equipment: none   Reasons for discharge: Discharged from hospital   Patient/Family Agrees with Progress Made and Goals Achieved: Yes    Arbutus Leas 06/25/2020, 10:02 AM

## 2020-06-25 NOTE — Progress Notes (Signed)
[place] PHYSICAL MEDICINE & REHABILITATION PROGRESS NOTE   Subjective/Complaints: Sleep has improved with melatonin Tone has improved with medication.  Constipation has resolved with mag citrate Nausea has resolved with Zofran  ROS: Denies CP, SOB, V/D, constipation, + pain in right hand  Objective:   No results found. No results for input(s): WBC, HGB, HCT, PLT in the last 72 hours. Recent Labs    06/23/20 0646  NA 137  K 4.5  CL 108  CO2 20*  GLUCOSE 78  BUN 27*  CREATININE 3.92*  CALCIUM 8.6*    Intake/Output Summary (Last 24 hours) at 06/25/2020 1036 Last data filed at 06/25/2020 0900 Gross per 24 hour  Intake 957 ml  Output --  Net 957 ml        Physical Exam: Vital Signs Blood pressure (!) 149/73, pulse 80, temperature 98.6 F (37 C), temperature source Oral, resp. rate 18, height '5\' 2"'$  (1.575 m), weight 66.5 kg, SpO2 97 %. Gen: no distress, normal appearing HEENT: oral mucosa pink and moist, NCAT Cardio: Reg rate Chest: normal effort, normal rate of breathing GI: Non-distended.  BS +.  Abdominal tenderness at site of injections Skin: Warm and dry.  Abdominal bruising. Psych: Normal mood.  Normal behavior. Musc: No edema in extremities.  No tenderness in extremities. Neuro: Alert Motor: LUE/LLE: 5/5 proximal distal RUE/RLE: 3+/5 proximal distal  Assessment/Plan: 1. Functional deficits which require 3+ hours per day of interdisciplinary therapy in a comprehensive inpatient rehab setting.  Physiatrist is providing close team supervision and 24 hour management of active medical problems listed below.  Physiatrist and rehab team continue to assess barriers to discharge/monitor patient progress toward functional and medical goals  Care Tool:  Bathing    Body parts bathed by patient: Right arm,Chest,Abdomen,Right upper leg,Left upper leg,Face,Front perineal area,Buttocks,Left lower leg,Left arm,Right lower leg   Body parts bathed by helper: Right  lower leg     Bathing assist Assist Level: Contact Guard/Touching assist     Upper Body Dressing/Undressing Upper body dressing   What is the patient wearing?: Pull over shirt,Bra    Upper body assist Assist Level: Supervision/Verbal cueing    Lower Body Dressing/Undressing Lower body dressing      What is the patient wearing?: Underwear/pull up,Pants     Lower body assist Assist for lower body dressing: Contact Guard/Touching assist     Toileting Toileting    Toileting assist Assist for toileting: Contact Guard/Touching assist     Transfers Chair/bed transfer  Transfers assist     Chair/bed transfer assist level: Contact Guard/Touching assist Chair/bed transfer assistive device: Programmer, multimedia   Ambulation assist      Assist level: Contact Guard/Touching assist Assistive device: Walker-rolling Max distance: 3f   Walk 10 feet activity   Assist  Walk 10 feet activity did not occur: Safety/medical concerns  Assist level: Contact Guard/Touching assist Assistive device: Walker-rolling   Walk 50 feet activity   Assist Walk 50 feet with 2 turns activity did not occur: Safety/medical concerns  Assist level: Contact Guard/Touching assist Assistive device: Walker-rolling    Walk 150 feet activity   Assist Walk 150 feet activity did not occur: Safety/medical concerns         Walk 10 feet on uneven surface  activity   Assist Walk 10 feet on uneven surfaces activity did not occur: Safety/medical concerns         Wheelchair     Assist Will patient use wheelchair at discharge?: Yes  Type of Wheelchair: Power    Wheelchair assist level: Minimal Assistance - Patient > 75%,Contact Guard/Touching assist Max wheelchair distance: >350    Wheelchair 50 feet with 2 turns activity    Assist        Assist Level: Contact Guard/Touching assist,Minimal Assistance - Patient > 75%   Wheelchair 150 feet activity      Assist      Assist Level: Contact Guard/Touching assist,Minimal Assistance - Patient > 75%   Blood pressure (!) 149/73, pulse 80, temperature 98.6 F (37 C), temperature source Oral, resp. rate 18, height '5\' 2"'$  (1.575 m), weight 66.5 kg, SpO2 97 %.   Medical Problem List and Plan: 1.Visual changes, now with spasticity, and functional deficits secondary to acute nonhemorrhagic dorsal right pontine infarct as well as history of multiple CVA in the past with right-sided spastic weakness and slurred speech. ?MS lesions also  Continue CIR   -Interdisciplinary Team Conference today   Cont tizanidine 3 times daily, may need to do Botox as an outpatient  2. Antithrombotics: -DVT/anticoagulation:Subcutaneous heparin-patient refusing injections, requesting fewer injections, will transition to Lovenox-dosing discussed with pharmacy given elevated creatinine 2/7: d/c Lovenox since ambulating >150 feet.  -antiplatelet therapy: Aspirin 81 mg daily and Plavix 75 mg daily 3. Pain Management:Tylenol as needed. Add voltaren gel for right hand- discussed risk of swelling given ibuprofen allergy 4. Mood:Continue Prozac 20 mg daily -antipsychotic agents: N/A 5. Neuropsych: This patientiscapable of making decisions on herown behalf. 6. Skin/Wound Care:Routine skin checks 7. Fluids/Electrolytes/Nutrition:Routine in and outs  8. Hypertension. Continue Norvasc 10 mg daily, Lopressor 100 mg twice daily. Monitor with increased mobility Vitals:   06/24/20 2010 06/25/20 0550  BP: (!) 157/72 (!) 149/73  Pulse: 88 80  Resp: 16 18  Temp: 99 F (37.2 C) 98.6 F (37 C)  SpO2: 96% 97%   2/9: Check BP outpatient, log, and bring to f/u appointment 9. Diabetes mellitus with hyperglycemia. Hemoglobin A1c 8.9. Check blood sugars before meals and at bedtime.   CBG (last 3)  Recent Labs    06/24/20 2158 06/25/20 0622 06/25/20 0651  GLUCAP 177* 68* 104*     2/9: discussed hypoglycemic event with patient: decrease Lantus to 13U  10. CKD stage IV. Latest baseline creatinine from February 2021 was 2.81.   Started IVF at noc  Creatinine 4.41 2/1, 3.92 on 2/7 11.? Multiple sclerosis. Self-reported. Multiple new lesions on MRI c/w with diagnosis but pt apparently never has been treated.  -pt to f/u with Dr. Leonie Man as outpt to further explore.  12. GERD. Pepcid 13. Hyperlipidemia. Crestor 14.  Anemia  Hemoglobin 8.8 on 2/3, continue to monitor 15. Constipation: magnesium citrate ordered. Had BM with this.  16. Insomnia: melatonin ordered. This has helped LOS: 13 days A FACE TO [place] 06/25/2020, 10:36 AM

## 2020-06-26 LAB — GLUCOSE, CAPILLARY: Glucose-Capillary: 112 mg/dL — ABNORMAL HIGH (ref 70–99)

## 2020-06-26 MED ORDER — DOCUSATE SODIUM 100 MG PO CAPS: 100.0000 mg | ORAL_CAPSULE | Freq: Two times a day (BID) | ORAL | 2 refills | Status: DC

## 2020-06-26 NOTE — Progress Notes (Signed)
Recreational Therapy Discharge Summary Patient Details  Name: Callista Hoh MRN: [id number] Date of Birth: [birth year] Today's Date: 06/26/2020  Long term goals set: 1  Long term goals met: 1  Comments on progress toward goals: Pt has made good progress during LOS and is discharging home with family to provide/coordinate 24 hour care.  TR sessions focused on activity analysis, identification of potential modifications for leisure, and coping strategies.  Pt required supervision cues.  Pt is excited about discharge home and continued progress. Goal met.   Reasons goals not met: n/a  Reasons for discharge: discharge from hospital  Patient/family agrees with progress made and goals achieved: Yes  Krrish Freund 06/26/2020, 4:29 PM

## 2020-06-26 NOTE — Progress Notes (Signed)
[place] PHYSICAL MEDICINE & REHABILITATION PROGRESS NOTE   Subjective/Complaints: She continues to have constipation and asks if anything will be sent for her for constipation. Discussed colace TID and eating 1 pear daily. She does not like prunes She is excited to go home!  ROS: Denies CP, SOB, V/D, + pain in right hand, constipation  Objective:   No results found. No results for input(s): WBC, HGB, HCT, PLT in the last 72 hours. No results for input(s): NA, K, CL, CO2, GLUCOSE, BUN, CREATININE, CALCIUM in the last 72 hours.  Intake/Output Summary (Last 24 hours) at 06/26/2020 0936 Last data filed at 06/26/2020 [id number] Gross per 24 hour  Intake 720 ml  Output --  Net 720 ml        Physical Exam: Vital Signs Blood pressure (!) 141/72, pulse 81, temperature 98.3 F (36.8 C), resp. rate 18, height '5\' 2"'$  (1.575 m), weight 66.5 kg, SpO2 97 %. Gen: no distress, normal appearing HEENT: oral mucosa pink and moist, NCAT Cardio: Reg rate Chest: normal effort, normal rate of breathing GI: Non-distended.  BS +.  Abdominal tenderness at site of injections Skin: Warm and dry.  Abdominal bruising. Psych: Normal mood.  Normal behavior. Musc: No edema in extremities.  No tenderness in extremities. Neuro: Alert Motor: LUE/LLE: 5/5 proximal distal RUE/RLE: 3+/5 proximal distal  Assessment/Plan: 1. Functional deficits which require 3+ hours per day of interdisciplinary therapy in a comprehensive inpatient rehab setting.  Physiatrist is providing close team supervision and 24 hour management of active medical problems listed below.  Physiatrist and rehab team continue to assess barriers to discharge/monitor patient progress toward functional and medical goals  Care Tool:  Bathing    Body parts bathed by patient: Right arm,Chest,Abdomen,Right upper leg,Left upper leg,Face,Front perineal area,Buttocks,Left lower leg,Left arm,Right lower leg   Body parts bathed by helper: Right lower  leg     Bathing assist Assist Level: Supervision/Verbal cueing     Upper Body Dressing/Undressing Upper body dressing   What is the patient wearing?: Pull over shirt,Bra    Upper body assist Assist Level: Supervision/Verbal cueing    Lower Body Dressing/Undressing Lower body dressing      What is the patient wearing?: Underwear/pull up,Pants     Lower body assist Assist for lower body dressing: Supervision/Verbal cueing     Toileting Toileting    Toileting assist Assist for toileting: Supervision/Verbal cueing     Transfers Chair/bed transfer  Transfers assist     Chair/bed transfer assist level: Contact Guard/Touching assist Chair/bed transfer assistive device: Programmer, multimedia   Ambulation assist      Assist level: Contact Guard/Touching assist Assistive device: Walker-rolling Max distance: 71f   Walk 10 feet activity   Assist  Walk 10 feet activity did not occur: Safety/medical concerns  Assist level: Contact Guard/Touching assist Assistive device: Walker-rolling   Walk 50 feet activity   Assist Walk 50 feet with 2 turns activity did not occur: Safety/medical concerns  Assist level: Contact Guard/Touching assist Assistive device: Walker-rolling    Walk 150 feet activity   Assist Walk 150 feet activity did not occur: Safety/medical concerns (limited by fatigue)         Walk 10 feet on uneven surface  activity   Assist Walk 10 feet on uneven surfaces activity did not occur: Safety/medical concerns   Assist level: Contact Guard/Touching assist Assistive device: Walker-rolling   Wheelchair     Assist Will patient use wheelchair at discharge?: Yes Type of  Wheelchair: Power    Wheelchair assist level: Supervision/Verbal cueing Max wheelchair distance: >350    Wheelchair 50 feet with 2 turns activity    Assist        Assist Level: Supervision/Verbal cueing   Wheelchair 150 feet activity      Assist      Assist Level: Supervision/Verbal cueing   Blood pressure (!) 141/72, pulse 81, temperature 98.3 F (36.8 C), resp. rate 18, height '5\' 2"'$  (1.575 m), weight 66.5 kg, SpO2 97 %.   Medical Problem List and Plan: 1.Visual changes, now with spasticity, and functional deficits secondary to acute nonhemorrhagic dorsal right pontine infarct as well as history of multiple CVA in the past with right-sided spastic weakness and slurred speech. ?MS lesions also  DC home with daughter  Cont tizanidine 3 times daily, may need to do Botox as an outpatient  2. Antithrombotics: -DVT/anticoagulation:Subcutaneous heparin-patient refusing injections, requesting fewer injections, will transition to Lovenox-dosing discussed with pharmacy given elevated creatinine 2/7: d/c Lovenox since ambulating >150 feet.  -antiplatelet therapy: Aspirin 81 mg daily and Plavix 75 mg daily 3. Pain Management:Tylenol as needed. Add voltaren gel for right hand- discussed risk of swelling given ibuprofen allergy 4. Mood:Continue Prozac 20 mg daily -antipsychotic agents: N/A 5. Neuropsych: This patientiscapable of making decisions on herown behalf. 6. Skin/Wound Care:Routine skin checks 7. Fluids/Electrolytes/Nutrition:Routine in and outs  8. Hypertension. Continue Norvasc 10 mg daily, Lopressor 100 mg twice daily. Monitor with increased mobility Vitals:   06/25/20 1929 06/26/20 0357  BP: 138/68 (!) 141/72  Pulse: 84 81  Resp: 16 18  Temp: 98.2 F (36.8 C) 98.3 F (36.8 C)  SpO2: 97% 97%   2/10: Better controlled today. Check BP outpatient, log, and bring to f/u appointment 9. Diabetes mellitus with hyperglycemia. Hemoglobin A1c 8.9. Check blood sugars before meals and at bedtime.   CBG (last 3)  Recent Labs    06/25/20 1623 06/25/20 2052 06/26/20 0609  GLUCAP 91 215* 112*    2/10: continue to provide deitary advise outpatient.  10. CKD stage  IV. Latest baseline creatinine from February 2021 was 2.81.   Started IVF at noc  Creatinine 4.41 2/1, 3.92 on 2/7 11.? Multiple sclerosis. Self-reported. Multiple new lesions on MRI c/w with diagnosis but pt apparently never has been treated.  -pt to f/u with Dr. Leonie Man as outpt to further explore.  12. GERD. Pepcid 13. Hyperlipidemia. Crestor 14.  Anemia  Hemoglobin 8.8 on 2/3, continue to monitor 15. Constipation: magnesium citrate ordered. Had BM with this.   2/10: discussed colace TID outpatient and pear daily.  16. Insomnia: melatonin ordered. This has helped LOS: 14 days A FACE TO FACE EVALUATION WAS PERFORMED  Izora Ribas 06/26/2020, 9:36 AM

## 2020-06-26 NOTE — Plan of Care (Signed)
  Problem: Consults Goal: RH STROKE PATIENT EDUCATION Description: See Patient Education module for education specifics  Outcome: Progressing Goal: Diabetes Guidelines if Diabetic/Glucose > 140 Description: If diabetic or lab glucose is > 140 mg/dl - Initiate Diabetes/Hyperglycemia Guidelines & Document Interventions  Outcome: Progressing   Problem: RH BOWEL ELIMINATION Goal: RH STG MANAGE BOWEL WITH ASSISTANCE Description: STG Manage Bowel with supervision Assistance. Outcome: Progressing   Problem: RH BLADDER ELIMINATION Goal: RH STG MANAGE BLADDER WITH ASSISTANCE Description: STG Manage Bladder With supervision Assistance Outcome: Progressing   Problem: RH PAIN MANAGEMENT Goal: RH STG PAIN MANAGED AT OR BELOW PT'S PAIN GOAL Description: Pain level less than 4 on scale of 0-10 Outcome: Progressing   Problem: RH KNOWLEDGE DEFICIT Goal: RH STG INCREASE KNOWLEDGE OF DIABETES Description: Pt will be able to demonstrate understanding of medication regimen, dietary and lifestyle modification to better control blood glucose levels with mod I assist.  Outcome: Progressing Goal: RH STG INCREASE KNOWLEDGE OF HYPERTENSION Description: Pt will be able to demonstrate understanding of medication regimen, dietary and lifestyle modification to better control blood pressure with mod I assist.  Outcome: Progressing Goal: RH STG INCREASE KNOWLEGDE OF HYPERLIPIDEMIA Description: Pt will be able to demonstrate understanding of medication regimen, dietary and lifestyle modification to better control blood cholesterol levels with mod I assist.  Outcome: Progressing Goal: RH STG INCREASE KNOWLEDGE OF STROKE PROPHYLAXIS Description: Pt will be able to demonstrate understanding of medication regimen, dietary and lifestyle modification to better control blood levels prevent further occurrence of stroke with mod I assist.  Outcome: Progressing

## 2020-06-26 NOTE — Progress Notes (Signed)
Inpatient Rehabilitation Care Coordinator Discharge Note  The overall goal for the admission was met for:   Discharge location: Yes, home  Length of Stay: Yes, 14 Days   Discharge activity level: Yes,  power wheelchair level supervision performing stand pivot transfers using RW with CGA  Home/community participation: Yes  Services provided included: MD, RD, PT, OT, SLP, RN, CM, TR, Pharmacy, Neuropsych and SW  Financial Services: Private Insurance: [employer] Medicare  Choices offered to/list presented to:patient    Follow-up services arranged: Home Health: [facility]  Comments (or additional information): PT OT  Patient/Family verbalized understanding of follow-up arrangements: Yes  Individual responsible for coordination of the follow-up plan: patient/Miesha (daughter) [phone]  Confirmed correct DME delivered: Christina J Baskerville 06/26/2020    Christina J Baskerville 

## 2020-06-26 NOTE — Plan of Care (Signed)
  Problem: Consults Goal: RH STROKE PATIENT EDUCATION Description: See Patient Education module for education specifics  Outcome: Completed/Met Goal: Diabetes Guidelines if Diabetic/Glucose > 140 Description: If diabetic or lab glucose is > 140 mg/dl - Initiate Diabetes/Hyperglycemia Guidelines & Document Interventions  Outcome: Completed/Met   Problem: RH BOWEL ELIMINATION Goal: RH STG MANAGE BOWEL WITH ASSISTANCE Description: STG Manage Bowel with supervision Assistance. Outcome: Completed/Met   Problem: RH BLADDER ELIMINATION Goal: RH STG MANAGE BLADDER WITH ASSISTANCE Description: STG Manage Bladder With supervision Assistance Outcome: Completed/Met   Problem: RH PAIN MANAGEMENT Goal: RH STG PAIN MANAGED AT OR BELOW PT'S PAIN GOAL Description: Pain level less than 4 on scale of 0-10 Outcome: Completed/Met   Problem: RH KNOWLEDGE DEFICIT Goal: RH STG INCREASE KNOWLEDGE OF DIABETES Description: Pt will be able to demonstrate understanding of medication regimen, dietary and lifestyle modification to better control blood glucose levels with mod I assist.  Outcome: Completed/Met Goal: RH STG INCREASE KNOWLEDGE OF HYPERTENSION Description: Pt will be able to demonstrate understanding of medication regimen, dietary and lifestyle modification to better control blood pressure with mod I assist.  Outcome: Completed/Met Goal: RH STG INCREASE KNOWLEGDE OF HYPERLIPIDEMIA Description: Pt will be able to demonstrate understanding of medication regimen, dietary and lifestyle modification to better control blood cholesterol levels with mod I assist.  Outcome: Completed/Met Goal: RH STG INCREASE KNOWLEDGE OF STROKE PROPHYLAXIS Description: Pt will be able to demonstrate understanding of medication regimen, dietary and lifestyle modification to better control blood levels prevent further occurrence of stroke with mod I assist.  Outcome: Completed/Met

## 2020-06-26 NOTE — Progress Notes (Signed)
Patient discharged off of floor with all belongings. Discharge instructions given by PA, Quillian Quince. No complications noted at this time.

## 2020-07-07 DIAGNOSIS — E1122 Type 2 diabetes mellitus with diabetic chronic kidney disease: Secondary | ICD-10-CM | POA: Diagnosis not present

## 2020-07-07 DIAGNOSIS — G35 Multiple sclerosis: Secondary | ICD-10-CM | POA: Diagnosis not present

## 2020-07-07 DIAGNOSIS — I129 Hypertensive chronic kidney disease with stage 1 through stage 4 chronic kidney disease, or unspecified chronic kidney disease: Secondary | ICD-10-CM | POA: Diagnosis not present

## 2020-07-07 DIAGNOSIS — Z7902 Long term (current) use of antithrombotics/antiplatelets: Secondary | ICD-10-CM | POA: Diagnosis not present

## 2020-07-07 DIAGNOSIS — I69351 Hemiplegia and hemiparesis following cerebral infarction affecting right dominant side: Secondary | ICD-10-CM | POA: Diagnosis not present

## 2020-07-07 DIAGNOSIS — N184 Chronic kidney disease, stage 4 (severe): Secondary | ICD-10-CM | POA: Diagnosis not present

## 2020-07-09 ENCOUNTER — Encounter: Payer: Medicare Other | Attending: Registered Nurse | Admitting: Registered Nurse

## 2020-07-15 DIAGNOSIS — I129 Hypertensive chronic kidney disease with stage 1 through stage 4 chronic kidney disease, or unspecified chronic kidney disease: Secondary | ICD-10-CM | POA: Diagnosis not present

## 2020-07-15 DIAGNOSIS — N184 Chronic kidney disease, stage 4 (severe): Secondary | ICD-10-CM | POA: Diagnosis not present

## 2020-07-15 DIAGNOSIS — E1122 Type 2 diabetes mellitus with diabetic chronic kidney disease: Secondary | ICD-10-CM | POA: Diagnosis not present

## 2020-07-15 DIAGNOSIS — Z7902 Long term (current) use of antithrombotics/antiplatelets: Secondary | ICD-10-CM | POA: Diagnosis not present

## 2020-07-15 DIAGNOSIS — G35 Multiple sclerosis: Secondary | ICD-10-CM | POA: Diagnosis not present

## 2020-07-15 DIAGNOSIS — I69351 Hemiplegia and hemiparesis following cerebral infarction affecting right dominant side: Secondary | ICD-10-CM | POA: Diagnosis not present

## 2020-07-24 DIAGNOSIS — E1122 Type 2 diabetes mellitus with diabetic chronic kidney disease: Secondary | ICD-10-CM | POA: Diagnosis not present

## 2020-07-24 DIAGNOSIS — I69351 Hemiplegia and hemiparesis following cerebral infarction affecting right dominant side: Secondary | ICD-10-CM | POA: Diagnosis not present

## 2020-07-24 DIAGNOSIS — Z7902 Long term (current) use of antithrombotics/antiplatelets: Secondary | ICD-10-CM | POA: Diagnosis not present

## 2020-07-24 DIAGNOSIS — G35 Multiple sclerosis: Secondary | ICD-10-CM | POA: Diagnosis not present

## 2020-07-24 DIAGNOSIS — N184 Chronic kidney disease, stage 4 (severe): Secondary | ICD-10-CM | POA: Diagnosis not present

## 2020-07-24 DIAGNOSIS — I129 Hypertensive chronic kidney disease with stage 1 through stage 4 chronic kidney disease, or unspecified chronic kidney disease: Secondary | ICD-10-CM | POA: Diagnosis not present

## 2020-09-12 ENCOUNTER — Emergency Department (HOSPITAL_COMMUNITY): Payer: Medicare Other

## 2020-09-12 ENCOUNTER — Inpatient Hospital Stay (HOSPITAL_COMMUNITY)
Admission: EM | Admit: 2020-09-12 | Discharge: 2020-09-17 | DRG: 683 | Disposition: A | Discharge status: Home / Self Care | Payer: Medicare Other | Attending: Student | Admitting: Student

## 2020-09-12 ENCOUNTER — Encounter (HOSPITAL_COMMUNITY): Payer: Self-pay

## 2020-09-12 ENCOUNTER — Other Ambulatory Visit: Payer: Self-pay

## 2020-09-12 DIAGNOSIS — E871 Hypo-osmolality and hyponatremia: Secondary | ICD-10-CM | POA: Diagnosis present

## 2020-09-12 DIAGNOSIS — E860 Dehydration: Secondary | ICD-10-CM | POA: Diagnosis present

## 2020-09-12 DIAGNOSIS — Z886 Allergy status to analgesic agent status: Secondary | ICD-10-CM

## 2020-09-12 DIAGNOSIS — N179 Acute kidney failure, unspecified: Principal | ICD-10-CM | POA: Diagnosis present

## 2020-09-12 DIAGNOSIS — E876 Hypokalemia: Secondary | ICD-10-CM | POA: Diagnosis present

## 2020-09-12 DIAGNOSIS — I69320 Aphasia following cerebral infarction: Secondary | ICD-10-CM

## 2020-09-12 DIAGNOSIS — Z20822 Contact with and (suspected) exposure to covid-19: Secondary | ICD-10-CM | POA: Diagnosis present

## 2020-09-12 DIAGNOSIS — N281 Cyst of kidney, acquired: Secondary | ICD-10-CM | POA: Diagnosis present

## 2020-09-12 DIAGNOSIS — E119 Type 2 diabetes mellitus without complications: Secondary | ICD-10-CM

## 2020-09-12 DIAGNOSIS — Z8673 Personal history of transient ischemic attack (TIA), and cerebral infarction without residual deficits: Secondary | ICD-10-CM | POA: Diagnosis not present

## 2020-09-12 DIAGNOSIS — N185 Chronic kidney disease, stage 5: Secondary | ICD-10-CM | POA: Diagnosis present

## 2020-09-12 DIAGNOSIS — Z888 Allergy status to other drugs, medicaments and biological substances status: Secondary | ICD-10-CM

## 2020-09-12 DIAGNOSIS — N189 Chronic kidney disease, unspecified: Secondary | ICD-10-CM | POA: Diagnosis present

## 2020-09-12 DIAGNOSIS — E1122 Type 2 diabetes mellitus with diabetic chronic kidney disease: Secondary | ICD-10-CM | POA: Diagnosis present

## 2020-09-12 DIAGNOSIS — R1031 Right lower quadrant pain: Secondary | ICD-10-CM | POA: Diagnosis not present

## 2020-09-12 DIAGNOSIS — I69351 Hemiplegia and hemiparesis following cerebral infarction affecting right dominant side: Secondary | ICD-10-CM

## 2020-09-12 DIAGNOSIS — I10 Essential (primary) hypertension: Secondary | ICD-10-CM | POA: Diagnosis present

## 2020-09-12 DIAGNOSIS — I150 Renovascular hypertension: Secondary | ICD-10-CM

## 2020-09-12 DIAGNOSIS — Z7902 Long term (current) use of antithrombotics/antiplatelets: Secondary | ICD-10-CM

## 2020-09-12 DIAGNOSIS — Z79899 Other long term (current) drug therapy: Secondary | ICD-10-CM

## 2020-09-12 DIAGNOSIS — I120 Hypertensive chronic kidney disease with stage 5 chronic kidney disease or end stage renal disease: Secondary | ICD-10-CM | POA: Diagnosis present

## 2020-09-12 DIAGNOSIS — E785 Hyperlipidemia, unspecified: Secondary | ICD-10-CM | POA: Diagnosis present

## 2020-09-12 DIAGNOSIS — R109 Unspecified abdominal pain: Secondary | ICD-10-CM

## 2020-09-12 DIAGNOSIS — E1165 Type 2 diabetes mellitus with hyperglycemia: Secondary | ICD-10-CM

## 2020-09-12 DIAGNOSIS — Z7984 Long term (current) use of oral hypoglycemic drugs: Secondary | ICD-10-CM

## 2020-09-12 DIAGNOSIS — Z794 Long term (current) use of insulin: Secondary | ICD-10-CM

## 2020-09-12 DIAGNOSIS — Z885 Allergy status to narcotic agent status: Secondary | ICD-10-CM

## 2020-09-12 DIAGNOSIS — N184 Chronic kidney disease, stage 4 (severe): Secondary | ICD-10-CM

## 2020-09-12 DIAGNOSIS — G35 Multiple sclerosis: Secondary | ICD-10-CM | POA: Diagnosis present

## 2020-09-12 DIAGNOSIS — Z887 Allergy status to serum and vaccine status: Secondary | ICD-10-CM

## 2020-09-12 DIAGNOSIS — D631 Anemia in chronic kidney disease: Secondary | ICD-10-CM | POA: Diagnosis present

## 2020-09-12 LAB — URINALYSIS, ROUTINE W REFLEX MICROSCOPIC
Bacteria, UA: NONE SEEN
Bilirubin Urine: NEGATIVE
Glucose, UA: >=500 mg/dL — AB
Hgb urine dipstick: NEGATIVE
Ketones, ur: NEGATIVE mg/dL
Leukocytes,Ua: NEGATIVE
Nitrite: NEGATIVE
Protein, ur: >=300 mg/dL — AB
Specific Gravity, Urine: 1.007 (ref 1.005–1.030)
pH: 6 (ref 5.0–8.0)

## 2020-09-12 LAB — CBC WITH DIFFERENTIAL/PLATELET
Abs Immature Granulocytes: 0.01 10*3/uL (ref 0.00–0.07)
Basophils Absolute: 0.1 10*3/uL (ref 0.0–0.1)
Basophils Relative: 1 %
Eosinophils Absolute: 0.3 10*3/uL (ref 0.0–0.5)
Eosinophils Relative: 3 %
HCT: 31.3 % — ABNORMAL LOW (ref 36.0–46.0)
Hemoglobin: 10.3 g/dL — ABNORMAL LOW (ref 12.0–15.0)
Immature Granulocytes: 0 %
Lymphocytes Relative: 20 %
Lymphs Abs: 1.8 10*3/uL (ref 0.7–4.0)
MCH: 28.1 pg (ref 26.0–34.0)
MCHC: 32.9 g/dL (ref 30.0–36.0)
MCV: 85.3 fL (ref 80.0–100.0)
Monocytes Absolute: 0.7 10*3/uL (ref 0.1–1.0)
Monocytes Relative: 7 %
Neutro Abs: 6.2 10*3/uL (ref 1.7–7.7)
Neutrophils Relative %: 69 %
Platelets: 271 10*3/uL (ref 150–400)
RBC: 3.67 MIL/uL — ABNORMAL LOW (ref 3.87–5.11)
RDW: 12.8 % (ref 11.5–15.5)
WBC: 9.1 10*3/uL (ref 4.0–10.5)
nRBC: 0 % (ref 0.0–0.2)

## 2020-09-12 LAB — COMPREHENSIVE METABOLIC PANEL
ALT: 10 U/L (ref 0–44)
AST: 14 U/L — ABNORMAL LOW (ref 15–41)
Albumin: 3.8 g/dL (ref 3.5–5.0)
Alkaline Phosphatase: 75 U/L (ref 38–126)
Anion gap: 12 (ref 5–15)
BUN: 53 mg/dL — ABNORMAL HIGH (ref 6–20)
CO2: 25 mmol/L (ref 22–32)
Calcium: 9.5 mg/dL (ref 8.9–10.3)
Chloride: 97 mmol/L — ABNORMAL LOW (ref 98–111)
Creatinine, Ser: 5.85 mg/dL — ABNORMAL HIGH (ref 0.44–1.00)
GFR, Estimated: 8 mL/min — ABNORMAL LOW (ref >60)
Glucose, Bld: 184 mg/dL — ABNORMAL HIGH (ref 70–99)
Potassium: 3.5 mmol/L (ref 3.5–5.1)
Sodium: 134 mmol/L — ABNORMAL LOW (ref 135–145)
Total Bilirubin: 0.5 mg/dL (ref 0.3–1.2)
Total Protein: 8.7 g/dL — ABNORMAL HIGH (ref 6.5–8.1)

## 2020-09-12 LAB — I-STAT CHEM 8, ED
BUN: 47 mg/dL — ABNORMAL HIGH (ref 6–20)
Calcium, Ion: 1.18 mmol/L (ref 1.15–1.40)
Chloride: 98 mmol/L (ref 98–111)
Creatinine, Ser: 6.3 mg/dL — ABNORMAL HIGH (ref 0.44–1.00)
Glucose, Bld: 182 mg/dL — ABNORMAL HIGH (ref 70–99)
HCT: 31 % — ABNORMAL LOW (ref 36.0–46.0)
Hemoglobin: 10.5 g/dL — ABNORMAL LOW (ref 12.0–15.0)
Potassium: 3.6 mmol/L (ref 3.5–5.1)
Sodium: 137 mmol/L (ref 135–145)
TCO2: 26 mmol/L (ref 22–32)

## 2020-09-12 LAB — BRAIN NATRIURETIC PEPTIDE: B Natriuretic Peptide: 77.8 pg/mL (ref 0.0–100.0)

## 2020-09-12 LAB — CBG MONITORING, ED: Glucose-Capillary: 182 mg/dL — ABNORMAL HIGH (ref 70–99)

## 2020-09-12 LAB — RESP PANEL BY RT-PCR (FLU A&B, COVID) ARPGX2
Influenza A by PCR: NEGATIVE
Influenza B by PCR: NEGATIVE
SARS Coronavirus 2 by RT PCR: NEGATIVE

## 2020-09-12 LAB — MAGNESIUM: Magnesium: 2.4 mg/dL (ref 1.7–2.4)

## 2020-09-12 LAB — PHOSPHORUS: Phosphorus: 5.7 mg/dL — ABNORMAL HIGH (ref 2.5–4.6)

## 2020-09-12 MED ORDER — AMLODIPINE BESYLATE 10 MG PO TABS
10.0000 mg | ORAL_TABLET | Freq: Every day | ORAL | Status: DC
  Administered 2020-09-13 – 2020-09-17 (×5): 10 mg via ORAL
  Filled 2020-09-12 (×5): qty 1

## 2020-09-12 MED ORDER — ACETAMINOPHEN 325 MG PO TABS: 650.0000 mg | ORAL_TABLET | Freq: Four times a day (QID) | ORAL | Status: DC | PRN

## 2020-09-12 MED ORDER — ACETAMINOPHEN 650 MG RE SUPP: 650.0000 mg | Freq: Four times a day (QID) | RECTAL | Status: DC | PRN

## 2020-09-12 MED ORDER — MELATONIN 3 MG PO TABS
3.0000 mg | ORAL_TABLET | Freq: Every day | ORAL | Status: DC
  Administered 2020-09-12 – 2020-09-16 (×5): 3 mg via ORAL
  Filled 2020-09-12 (×5): qty 1

## 2020-09-12 MED ORDER — FAMOTIDINE 20 MG PO TABS
20.0000 mg | ORAL_TABLET | Freq: Every day | ORAL | Status: DC
  Administered 2020-09-13 – 2020-09-17 (×5): 20 mg via ORAL
  Filled 2020-09-12 (×5): qty 1

## 2020-09-12 MED ORDER — SODIUM CHLORIDE 0.9 % IV BOLUS
1000.0000 mL | Freq: Once | INTRAVENOUS | Status: AC
  Administered 2020-09-12: 1000 mL via INTRAVENOUS

## 2020-09-12 MED ORDER — INSULIN DEGLUDEC 100 UNIT/ML ~~LOC~~ SOPN: 10.0000 [IU] | PEN_INJECTOR | Freq: Every day | SUBCUTANEOUS | Status: DC

## 2020-09-12 MED ORDER — ALBUTEROL SULFATE (2.5 MG/3ML) 0.083% IN NEBU
2.5000 mg | INHALATION_SOLUTION | RESPIRATORY_TRACT | Status: DC | PRN
Start: 2020-09-12 — End: 2020-09-17

## 2020-09-12 MED ORDER — INSULIN ASPART 100 UNIT/ML IJ SOLN
0.0000 [IU] | Freq: Three times a day (TID) | INTRAMUSCULAR | Status: DC
  Administered 2020-09-13: 2 [IU] via SUBCUTANEOUS
  Administered 2020-09-13: 1 [IU] via SUBCUTANEOUS
  Administered 2020-09-13: 2 [IU] via SUBCUTANEOUS
  Administered 2020-09-14 – 2020-09-15 (×3): 1 [IU] via SUBCUTANEOUS
  Administered 2020-09-15 – 2020-09-16 (×2): 2 [IU] via SUBCUTANEOUS
  Administered 2020-09-16: 3 [IU] via SUBCUTANEOUS
  Filled 2020-09-12: qty 0.09

## 2020-09-12 MED ORDER — FLUOXETINE HCL 20 MG PO CAPS
20.0000 mg | ORAL_CAPSULE | Freq: Every day | ORAL | Status: DC
  Administered 2020-09-13 – 2020-09-17 (×5): 20 mg via ORAL
  Filled 2020-09-12 (×5): qty 1

## 2020-09-12 MED ORDER — CLOPIDOGREL BISULFATE 75 MG PO TABS
75.0000 mg | ORAL_TABLET | Freq: Every day | ORAL | Status: DC
  Administered 2020-09-13 – 2020-09-17 (×5): 75 mg via ORAL
  Filled 2020-09-12 (×5): qty 1

## 2020-09-12 MED ORDER — INSULIN GLARGINE 100 UNIT/ML ~~LOC~~ SOLN
10.0000 [IU] | Freq: Every day | SUBCUTANEOUS | Status: DC
  Administered 2020-09-12 – 2020-09-16 (×5): 10 [IU] via SUBCUTANEOUS
  Filled 2020-09-12 (×5): qty 0.1

## 2020-09-12 MED ORDER — ONDANSETRON HCL 4 MG/2ML IJ SOLN
4.0000 mg | Freq: Four times a day (QID) | INTRAMUSCULAR | Status: DC | PRN
  Administered 2020-09-14 – 2020-09-15 (×2): 4 mg via INTRAVENOUS
  Filled 2020-09-12 (×2): qty 2

## 2020-09-12 MED ORDER — ONDANSETRON HCL 4 MG PO TABS: 4.0000 mg | ORAL_TABLET | Freq: Four times a day (QID) | ORAL | Status: DC | PRN

## 2020-09-12 MED ORDER — ROSUVASTATIN CALCIUM 10 MG PO TABS
10.0000 mg | ORAL_TABLET | Freq: Every day | ORAL | Status: DC
  Administered 2020-09-13 – 2020-09-17 (×5): 10 mg via ORAL
  Filled 2020-09-12 (×5): qty 1

## 2020-09-12 MED ORDER — SODIUM CHLORIDE 0.9 % IV SOLN
INTRAVENOUS | Status: DC
  Administered 2020-09-13: 100 mL/h via INTRAVENOUS

## 2020-09-12 MED ORDER — METOPROLOL TARTRATE 50 MG PO TABS
100.0000 mg | ORAL_TABLET | Freq: Two times a day (BID) | ORAL | Status: DC
  Administered 2020-09-12: 100 mg via ORAL
  Filled 2020-09-12: qty 4

## 2020-09-12 NOTE — ED Notes (Signed)
CBG- 182 

## 2020-09-12 NOTE — H&P (Signed)
History and Physical  Patient Name: Hannah [name]     [id number]    DOB: 1966-03-02    DOA: 09/12/2020 PCP: Vernie Shanks, MD  Patient coming from: Home  Chief Complaint: Elevated renal indices    HPI: Hannah [name] is a 55 y.o. female, with PMH of CKD 4, type 2 diabetes, previous CVA, hypertension, dyslipidemia who presented to the ER on 09/12/2020 secondary to lab results.  Patient states in the past few days she has had poor p.o. intake, nausea, not feeling well.  She denies any dysuria or changes in amount of urine.  Denies any shortness of breath or chest pain.  She did not have a COVID right lower quadrant abdominal pain but has resolved.  She says her sugars have been fluctuating at home.  She went to her primary care doctor yesterday for an annual checkup and had routine lab work drawn that showed her creatinine to be 6, and her baseline creatinine appears around 4.  Thus it was advised she go to the ER for evaluation.    ED course: -Vitals on admission: Afebrile, heart rate 70, respiratory rate 16, blood pressure 178/83, maintaining sats on room air -Labs on initial presentation: 34, potassium 3.5, chloride 97, bicarb 25, glucose 184, BUN 53, creatinine 5.85, calcium 9.5, hemoglobin 10.3, WBC 9.1 -Imaging obtained on admission: Renal ultrasound was negative for hydronephrosis, but did show increased echogenicity consistent with medical renal disease -In the ED the patient was given a liter of fluids, and contacted nephrology about AKI.  Nephrology said okay to keep at [facility] long and give IV fluids.  If renal indices do not improve, can consult.  Thus the hospitalist service was contacted for further evaluation and management.     ROS: A complete and thorough 12 point review of systems obtained, negative listed in HPI.     Past Medical History:  Diagnosis Date  . Cerebral infarction ([place]) 07/12/2017   2009 first one, has had a total of 4 stokes  . Diabetes type 2,  controlled ([place])   . Hemiparesis affecting right side as late effect of cerebrovascular accident (CVA) ([place])   . Hyperlipidemia   . Hypertension   . Multiple sclerosis ([place]) 2009  . Stroke [facility])     Past Surgical History:  Procedure Laterality Date  . APPENDECTOMY    . CYSTOSTOMY W/ BLADDER BIOPSY    . OVARIAN CYST SURGERY    . stent placed in heart      Social History: Patient lives at home.  Non smoker.  Allergies  Allergen Reactions  . Ibuprofen Other (See Comments) and Swelling    Edema Swelling to hands, feet and face   . Influenza Vaccines Anaphylaxis    Swelling, fever  . Pneumococcal Vaccine Anaphylaxis    Swelling, fever Swelling, fever   . Promethazine Other (See Comments)    hallucinations hallucinations   . Ambien [Zolpidem]     Other reaction(s): Unknown  . Cyclobenzaprine Other (See Comments)    hallucinations   . Tape Itching  . Tramadol Hcl     Other reaction(s): hallucinations  . Oxycodone Nausea Only and Nausea And Vomiting    Family history: family history is not on file.  Prior to Admission medications   Medication Sig Start Date End Date Taking? Authorizing Provider  acetaminophen (TYLENOL) 325 MG tablet Take 2 tablets (650 mg total) by mouth every 4 (four) hours as needed for mild pain (or temp > 37.5 C (99.5 F)). 06/25/20  Yes Angiulli, Lavon Paganini, PA-C  amLODipine (NORVASC) 10 MG tablet Take 1 tablet (10 mg total) by mouth daily. 06/25/20  Yes Angiulli, Lavon Paganini, PA-C  clopidogrel (PLAVIX) 75 MG tablet Take 1 tablet (75 mg total) by mouth daily. 06/25/20  Yes Angiulli, Lavon Paganini, PA-C  diclofenac Sodium (VOLTAREN) 1 % GEL Apply 2 g topically 4 (four) times daily. Patient taking differently: Apply 2 g topically 4 (four) times daily as needed (pain). 06/25/20  Yes Angiulli, Lavon Paganini, PA-C  ergocalciferol (VITAMIN D2) 1.25 MG (50000 UT) capsule Take 50,000 Units by mouth once a week.   Yes [provider]  famotidine (PEPCID) 20 MG tablet Take  1 tablet (20 mg total) by mouth daily. 06/25/20  Yes Angiulli, Lavon Paganini, PA-C  FLUoxetine (PROZAC) 20 MG capsule Take 1 capsule (20 mg total) by mouth daily. 06/25/20  Yes Angiulli, Lavon Paganini, PA-C  glipiZIDE (GLUCOTROL) 10 MG tablet Take 1 tablet (10 mg total) by mouth 2 (two) times daily before a meal. 06/25/20  Yes Angiulli, Lavon Paganini, PA-C  hydrochlorothiazide (HYDRODIURIL) 25 MG tablet Take 25 mg by mouth every morning. 08/22/20  Yes [provider]  insulin degludec (TRESIBA FLEXTOUCH) 100 UNIT/ML FlexTouch Pen Inject 0-20 Units into the skin at bedtime. Sliding scale   Yes [provider]  insulin lispro (INSULIN LISPRO) 100 UNIT/ML KwikPen Junior Inject 10 Units into the skin 3 (three) times daily.   Yes [provider]  losartan (COZAAR) 50 MG tablet Take 50 mg by mouth daily. 06/23/20  Yes [provider]  melatonin 3 MG TABS tablet Take 1 tablet (3 mg total) by mouth at bedtime. 06/25/20  Yes Angiulli, Lavon Paganini, PA-C  metoprolol tartrate (LOPRESSOR) 100 MG tablet Take 1 tablet (100 mg total) by mouth 2 (two) times daily. 06/25/20  Yes Angiulli, Lavon Paganini, PA-C  nitroGLYCERIN (NITROSTAT) 0.4 MG SL tablet Place 1 tablet (0.4 mg total) under the tongue every 5 (five) minutes as needed for chest pain. 06/25/20  Yes Angiulli, Lavon Paganini, PA-C  rosuvastatin (CRESTOR) 20 MG tablet Take 1 tablet (20 mg total) by mouth daily. Patient taking differently: Take 10 mg by mouth daily. 06/25/20  Yes Angiulli, Lavon Paganini, PA-C  tiZANidine (ZANAFLEX) 2 MG tablet Take 1 tablet (2 mg total) by mouth 3 (three) times daily. 06/25/20  Yes Angiulli, Lavon Paganini, PA-C  hydrALAZINE (APRESOLINE) 25 MG tablet Take 1 tablet (25 mg total) by mouth every 8 (eight) hours. Patient not taking: No sig reported 06/25/20   Angiulli, Lavon Paganini, PA-C       Physical Exam: BP (!) 182/85   Pulse 83   Temp 98.5 F (36.9 C) (Oral)   Resp 20   SpO2 100%   General appearance: Well-developed, adult female, alert and in  no acute distress .   Eyes: Anicteric, conjunctiva pink, lids and lashes normal. PERRL.    ENT: No nasal deformity, discharge, epistaxis.  Hearing intact. OP moist without lesions.   Neck: No neck masses.  Trachea midline.  No thyromegaly/tenderness. Lymph: No cervical or supraclavicular lymphadenopathy. Skin: Warm and dry.  No jaundice.  No suspicious rashes or lesions. Cardiac: RRR, nl S1-S2, no murmurs appreciated.  No LE edema.  Radial and pedal pulses 2+ and symmetric. Respiratory: Normal respiratory rate and rhythm.  CTAB without rales or wheezes. Abdomen: Abdomen soft.  No tenderness with palpation. No ascites, distension, hepatosplenomegaly.   MSK: No deformities or effusions of the large joints of the upper or lower extremities  bilaterally.  No cyanosis or clubbing. Neuro: Cranial nerves 2 through 12 grossly intact.  Sensation intact to light touch. Speech is fluent.  .       Labs on Admission:  I have personally reviewed following labs and imaging studies: CBC: Recent Labs  Lab 09/12/20 1859 09/12/20 2025  WBC 9.1  --   NEUTROABS 6.2  --   HGB 10.3* 10.5*  HCT 31.3* 31.0*  MCV 85.3  --   PLT 271  --    Basic Metabolic Panel: Recent Labs  Lab 09/12/20 1859 09/12/20 2025  NA 134* 137  K 3.5 3.6  CL 97* 98  CO2 25  --   GLUCOSE 184* 182*  BUN 53* 47*  CREATININE 5.85* 6.30*  CALCIUM 9.5  --   MG 2.4  --    GFR: CrCl cannot be calculated (Unknown ideal weight.).  Liver Function Tests: Recent Labs  Lab 09/12/20 1859  AST 14*  ALT 10  ALKPHOS 75  BILITOT 0.5  PROT 8.7*  ALBUMIN 3.8   No results for input(s): LIPASE, AMYLASE in the last 168 hours. No results for input(s): AMMONIA in the last 168 hours. Coagulation Profile: No results for input(s): INR, PROTIME in the last 168 hours. Cardiac Enzymes: No results for input(s): CKTOTAL, CKMB, CKMBINDEX, TROPONINI in the last 168 hours. BNP (last 3 results) No results for input(s): PROBNP in the last 8760  hours. HbA1C: No results for input(s): HGBA1C in the last 72 hours. CBG: No results for input(s): GLUCAP in the last 168 hours. Lipid Profile: No results for input(s): CHOL, HDL, LDLCALC, TRIG, CHOLHDL, LDLDIRECT in the last 72 hours. Thyroid Function Tests: No results for input(s): TSH, T4TOTAL, FREET4, T3FREE, THYROIDAB in the last 72 hours. Anemia Panel: No results for input(s): VITAMINB12, FOLATE, FERRITIN, TIBC, IRON, RETICCTPCT in the last 72 hours.   No results found for this or any previous visit (from the past 240 hour(s)).         Radiological Exams on Admission: Personally reviewed imaging which shows: Renal ultrasound negative-since, findings consistent with chronic renal disease. US Renal  Result Date: 09/12/2020 CLINICAL DATA:  Acute renal failure. EXAM: RENAL / URINARY TRACT ULTRASOUND COMPLETE COMPARISON:  None. FINDINGS: Right Kidney: Renal measurements: 9.4 cm x 4.0 cm x 4.7 cm = volume: 92.0 mL. Diffusely increased echogenicity of the renal parenchyma is noted. A 2.7 cm x 2.7 cm x 2.5 cm anechoic structure is seen within the right kidney. No abnormal flow is noted within this region on color Doppler evaluation. No hydronephrosis is visualized. Left Kidney: Renal measurements: 9.9 cm x 6.0 cm x 5.8 cm = volume: 182.1 mL. Diffusely increased echogenicity of the renal parenchyma is noted. A 1.2 cm x 1.4 cm x 1.0 cm anechoic structure is seen within the left kidney. No abnormal flow is noted within this region on color Doppler evaluation. No hydronephrosis is visualized. Bladder: Appears normal for degree of bladder distention. Bilateral ureteral jets are visualized. Other: None. IMPRESSION: 1. Bilateral simple renal cysts. 2. Increased echogenicity of the kidneys which may be secondary to medical renal disease. Electronically Signed   By: Virgina Norfolk M.D.   On: 09/12/2020 20:54         Assessment/Plan   1.  AKI on CKD 4 - Creatinine on admission 6.3.  Baseline  creatinine appears around 4 - No acute indication for renal replacement therapy at this time - Renal ultrasound obtained on admission did not show hydronephrosis,  butdid show  findings consistent with chronic renal disease - Possibly related to prerenal etiology - Started on IV fluids - HCTZ and losartan held - Strict I's and O's - Stressed the importance of patient going back to see her nephrologist as she is very close to starting on hemodialysis and ideally needs AV fistula placed - Follow-up labs ordered  2.  History of CVA - continue Statin and Plavix  3.  Hypertension - Continue home amlodipine, metoprolol - Hold home HCTZ and losartan given AKI  4.  Insulin-dependent type 2 diabetes - Hemoglobin A1c ordered - ordered Tresiba 10 units nightly.  At home she is on 10 to 20 units - Glucose checks, sliding scale - ADA diet  5.  CKD 5 -See plans above     DVT prophylaxis: SCDs Code Status: Full Family Communication: Daughter bedside Disposition Plan: Anticipate discharge home when medically optimized Consults called: None but nephrology was curb sided by ER Admission status: Observation   At the point of initial evaluation, it is my clinical opinion that admission for OBSERVATION is reasonable and necessary because the patient's presenting complaints in the context of their chronic conditions represent sufficient risk of deterioration or significant morbidity to constitute reasonable grounds for close observation in the hospital setting, but that the patient may be medically stable for discharge from the hospital within 24 to 48 hours.    Medical decision making: Patient seen at 9:47 PM on 09/12/2020.  The patient was discussed with ER provider.  What exists of the patient's chart was reviewed in depth and summarized above.  Clinical condition: Fair.        Doran Heater [facility] Please page though [place] or Epic secure chat:  For password, contact charge  nurse

## 2020-09-12 NOTE — ED Triage Notes (Signed)
Emergency Medicine Provider Triage Evaluation Note  Hadas Economos , a 55 y.o. female  was evaluated in triage.  Pt complains of nausea and abnormal creatinine of 6.  Notified by her PCP that her creatinine has significantly elevated she is told to come here for further evaluation.  She has nausea no other complaints at this time.  Patient feels fine currently.  Review of Systems  Positive: Nausea, vomiting Negative: Denies urinary symptoms, abdominal pain  Physical Exam  BP (!) 178/83 (BP Location: Left Arm)   Pulse 70   Temp 98.5 F (36.9 C) (Oral)   Resp 16   SpO2 99%  Gen:   Awake, no distress   HEENT:  Atraumatic  Resp:  Normal effort  Cardiac:  Normal rate  MSK:   Moves extremities without difficulty  Neuro:  Speech clear   Medical Decision Making  Medically screening exam initiated at 6:59 PM.  Appropriate orders placed.  Emilygrace Salvage was informed that the remainder of the evaluation will be completed by another provider, this initial triage assessment does not replace that evaluation, and the importance of remaining in the ED until their evaluation is complete.  Clinical Impression  Patient presents with nausea vomiting abnormal creatinine, lab and imaging was ordered, patient need further work-up in the emergency department.   Marcello Fennel, PA-C 09/12/20 1900

## 2020-09-12 NOTE — ED Triage Notes (Signed)
Pts daughter reports pt went to PCP and had bloodwork done yesterday. PCP called today to say that creatinine was 6. Pt has hx of kidney disease, but not this severe. Pt denies any symptoms.

## 2020-09-12 NOTE — ED Provider Notes (Signed)
[facility] DEPT Provider Note   CSN: [id number] Arrival date & time: 09/12/20  1726     History Chief Complaint  Patient presents with  . Abnormal Lab    Hannah [name] is a 55 y.o. female hx of stroke, DM, HTN, multiple sclerosis, here presenting with renal failure.  Patient states that she has not been eating and drinking much.  She went to see her doctor yesterday and had a annual checkup.  She had labs drawn and creatinine was 6.  I was able to review her records from her hospitalization in January.  On discharge her creatinine was somewhere around 4.  Patient denies any fevers or vomiting.  She does have some right flank pain.  The history is provided by the patient.       Past Medical History:  Diagnosis Date  . Cerebral infarction ([place]) 07/12/2017   2009 first one, has had a total of 4 stokes  . Diabetes type 2, controlled ([place])   . Hemiparesis affecting right side as late effect of cerebrovascular accident (CVA) ([place])   . Hyperlipidemia   . Hypertension   . Multiple sclerosis ([place]) 2009  . Stroke [facility])     Patient Active Problem List   Diagnosis Date Noted  . Acute blood loss anemia   . Chronic kidney disease (CKD), stage IV (severe) ([place])   . Uncontrolled type 2 diabetes mellitus with hyperglycemia ([place])   . Essential hypertension   . Right pontine cerebrovascular accident ([place]) 06/12/2020  . Stroke-like symptoms 06/09/2020  . Acute kidney injury superimposed on CKD ([place]) 06/09/2020  . Cerebral thrombosis with cerebral infarction 06/09/2020  . Hypertension   . Hyperlipidemia   . Diabetes type 2, controlled ([place])   . Bimalleolar ankle fracture 11/24/2018  . Multiple sclerosis ([place]) 2009    Past Surgical History:  Procedure Laterality Date  . APPENDECTOMY    . CYSTOSTOMY W/ BLADDER BIOPSY    . OVARIAN CYST SURGERY    . stent placed in heart       OB History   No obstetric history on file.     History reviewed. No  pertinent family history.  Social History   Tobacco Use  . Smoking status: Never Smoker  . Smokeless tobacco: Never Used  Vaping Use  . Vaping Use: Never used  Substance Use Topics  . Alcohol use: Not Currently  . Drug use: Never    Home Medications Prior to Admission medications   Medication Sig Start Date End Date Taking? Authorizing Provider  acetaminophen (TYLENOL) 325 MG tablet Take 2 tablets (650 mg total) by mouth every 4 (four) hours as needed for mild pain (or temp > 37.5 C (99.5 F)). 06/25/20  Yes Angiulli, Lavon Paganini, PA-C  amLODipine (NORVASC) 10 MG tablet Take 1 tablet (10 mg total) by mouth daily. 06/25/20  Yes Angiulli, Lavon Paganini, PA-C  clopidogrel (PLAVIX) 75 MG tablet Take 1 tablet (75 mg total) by mouth daily. 06/25/20  Yes Angiulli, Lavon Paganini, PA-C  diclofenac Sodium (VOLTAREN) 1 % GEL Apply 2 g topically 4 (four) times daily. Patient taking differently: Apply 2 g topically 4 (four) times daily as needed (pain). 06/25/20  Yes Angiulli, Lavon Paganini, PA-C  ergocalciferol (VITAMIN D2) 1.25 MG (50000 UT) capsule Take 50,000 Units by mouth once a week.   Yes [provider]  famotidine (PEPCID) 20 MG tablet Take 1 tablet (20 mg total) by mouth daily. 06/25/20  Yes Angiulli, Lavon Paganini, PA-C  FLUoxetine (  PROZAC) 20 MG capsule Take 1 capsule (20 mg total) by mouth daily. 06/25/20  Yes Angiulli, Lavon Paganini, PA-C  glipiZIDE (GLUCOTROL) 10 MG tablet Take 1 tablet (10 mg total) by mouth 2 (two) times daily before a meal. 06/25/20  Yes Angiulli, Lavon Paganini, PA-C  hydrochlorothiazide (HYDRODIURIL) 25 MG tablet Take 25 mg by mouth every morning. 08/22/20  Yes [provider]  insulin degludec (TRESIBA FLEXTOUCH) 100 UNIT/ML FlexTouch Pen Inject 0-20 Units into the skin at bedtime. Sliding scale   Yes [provider]  insulin lispro (INSULIN LISPRO) 100 UNIT/ML KwikPen Junior Inject 10 Units into the skin 3 (three) times daily.   Yes [provider]  losartan (COZAAR) 50 MG  tablet Take 50 mg by mouth daily. 06/23/20  Yes [provider]  melatonin 3 MG TABS tablet Take 1 tablet (3 mg total) by mouth at bedtime. 06/25/20  Yes Angiulli, Lavon Paganini, PA-C  metoprolol tartrate (LOPRESSOR) 100 MG tablet Take 1 tablet (100 mg total) by mouth 2 (two) times daily. 06/25/20  Yes Angiulli, Lavon Paganini, PA-C  nitroGLYCERIN (NITROSTAT) 0.4 MG SL tablet Place 1 tablet (0.4 mg total) under the tongue every 5 (five) minutes as needed for chest pain. 06/25/20  Yes Angiulli, Lavon Paganini, PA-C  rosuvastatin (CRESTOR) 20 MG tablet Take 1 tablet (20 mg total) by mouth daily. Patient taking differently: Take 10 mg by mouth daily. 06/25/20  Yes Angiulli, Lavon Paganini, PA-C  tiZANidine (ZANAFLEX) 2 MG tablet Take 1 tablet (2 mg total) by mouth 3 (three) times daily. 06/25/20  Yes Angiulli, Lavon Paganini, PA-C  docusate sodium (COLACE) 100 MG capsule Take 1 capsule (100 mg total) by mouth 2 (two) times daily. Patient not taking: No sig reported 06/26/20 06/26/21  Angiulli, Lavon Paganini, PA-C  hydrALAZINE (APRESOLINE) 25 MG tablet Take 1 tablet (25 mg total) by mouth every 8 (eight) hours. Patient not taking: No sig reported 06/25/20   Angiulli, Lavon Paganini, PA-C    Allergies    Ibuprofen, Influenza vaccines, Pneumococcal vaccine, Promethazine, Ambien [zolpidem], Cyclobenzaprine, Tape, Tramadol hcl, and Oxycodone  Review of Systems   Review of Systems  Neurological: Positive for weakness.  All other systems reviewed and are negative.   Physical Exam Updated Vital Signs BP (!) 184/91   Pulse 75   Temp 98.5 F (36.9 C) (Oral)   Resp 13   SpO2 100%   Physical Exam Vitals and nursing note reviewed.  Constitutional:      Comments: Chronically ill  HENT:     Head: Normocephalic.     Nose: Nose normal.     Mouth/Throat:     Mouth: Mucous membranes are dry.  Eyes:     Extraocular Movements: Extraocular movements intact.     Pupils: Pupils are equal, round, and reactive to light.  Cardiovascular:     Rate  and Rhythm: Normal rate and regular rhythm.     Pulses: Normal pulses.     Heart sounds: Normal heart sounds.  Pulmonary:     Effort: Pulmonary effort is normal.     Breath sounds: Normal breath sounds.  Abdominal:     General: Abdomen is flat.     Palpations: Abdomen is soft.  Musculoskeletal:        General: Normal range of motion.     Cervical back: Normal range of motion and neck supple.  Skin:    General: Skin is warm.     Capillary Refill: Capillary refill takes less than 2 seconds.  Neurological:     Comments: R sided weakness (chronic)   Psychiatric:        Mood and Affect: Mood normal.        Behavior: Behavior normal.     ED Results / Procedures / Treatments   Labs (all labs ordered are listed, but only abnormal results are displayed) Labs Reviewed  COMPREHENSIVE METABOLIC PANEL - Abnormal; Notable for the following components:      Result Value   Sodium 134 (*)    Chloride 97 (*)    Glucose, Bld 184 (*)    BUN 53 (*)    Creatinine, Ser 5.85 (*)    Total Protein 8.7 (*)    AST 14 (*)    GFR, Estimated 8 (*)    All other components within normal limits  CBC WITH DIFFERENTIAL/PLATELET - Abnormal; Notable for the following components:   RBC 3.67 (*)    Hemoglobin 10.3 (*)    HCT 31.3 (*)    All other components within normal limits  I-STAT CHEM 8, ED - Abnormal; Notable for the following components:   BUN 47 (*)    Creatinine, Ser 6.30 (*)    Glucose, Bld 182 (*)    Hemoglobin 10.5 (*)    HCT 31.0 (*)    All other components within normal limits  RESP PANEL BY RT-PCR (FLU A&B, COVID) ARPGX2  MAGNESIUM  BRAIN NATRIURETIC PEPTIDE  URINALYSIS, ROUTINE W REFLEX MICROSCOPIC    EKG EKG Interpretation  Date/Time:  Friday September 12 2020 19:53:09 EDT Ventricular Rate:  70 PR Interval:  193 QRS Duration: 88 QT Interval:  437 QTC Calculation: 472 R Axis:   13 Text Interpretation: Sinus rhythm Probable left atrial enlargement Minimal ST elevation, anterior  leads No significant change since last tracing Confirmed by Wandra Arthurs [phone]) on 09/12/2020 8:02:40 PM   Radiology US Renal  Result Date: 09/12/2020 CLINICAL DATA:  Acute renal failure. EXAM: RENAL / URINARY TRACT ULTRASOUND COMPLETE COMPARISON:  None. FINDINGS: Right Kidney: Renal measurements: 9.4 cm x 4.0 cm x 4.7 cm = volume: 92.0 mL. Diffusely increased echogenicity of the renal parenchyma is noted. A 2.7 cm x 2.7 cm x 2.5 cm anechoic structure is seen within the right kidney. No abnormal flow is noted within this region on color Doppler evaluation. No hydronephrosis is visualized. Left Kidney: Renal measurements: 9.9 cm x 6.0 cm x 5.8 cm = volume: 182.1 mL. Diffusely increased echogenicity of the renal parenchyma is noted. A 1.2 cm x 1.4 cm x 1.0 cm anechoic structure is seen within the left kidney. No abnormal flow is noted within this region on color Doppler evaluation. No hydronephrosis is visualized. Bladder: Appears normal for degree of bladder distention. Bilateral ureteral jets are visualized. Other: None. IMPRESSION: 1. Bilateral simple renal cysts. 2. Increased echogenicity of the kidneys which may be secondary to medical renal disease. Electronically Signed   By: Virgina Norfolk M.D.   On: 09/12/2020 20:54    Procedures Procedures   Medications Ordered in ED Medications  sodium chloride 0.9 % bolus 1,000 mL (1,000 mLs Intravenous New Bag/Given 09/12/20 2057)    ED Course  I have reviewed the triage vital signs and the nursing notes.  Pertinent labs & imaging results that were available during my care of the patient were reviewed by me and considered in my medical decision making (see chart for details).    MDM Rules/Calculators/A&P  Aroosh Vestal is a 55 y.o. female here with renal failure. Likely acute on chronic renal failure. Will recheck kidney function and get US renal.   9:22 PM Creatinine is 5.8.  Her potassium is normal.  Ultrasound showed  renal cysts but no hydro.  I discussed case with Dr. Johnney Ou from nephrology.  Since patient appears dehydrated, she agreed with IV fluids and she can [facility].  If her creatinine does not improve then they can consult nephrology inpatient.  Her baseline creatinine is around 4 and I think she likely has some superimposed dehydration so IV fluids will benefit her kidney function.   Final Clinical Impression(s) / ED Diagnoses Final diagnoses:  None    Rx / DC Orders ED Discharge Orders    None       Drenda Freeze, MD 09/12/20 2123

## 2020-09-13 DIAGNOSIS — N185 Chronic kidney disease, stage 5: Secondary | ICD-10-CM

## 2020-09-13 DIAGNOSIS — R5381 Other malaise: Secondary | ICD-10-CM | POA: Diagnosis not present

## 2020-09-13 DIAGNOSIS — Z8673 Personal history of transient ischemic attack (TIA), and cerebral infarction without residual deficits: Secondary | ICD-10-CM | POA: Diagnosis not present

## 2020-09-13 DIAGNOSIS — E1122 Type 2 diabetes mellitus with diabetic chronic kidney disease: Secondary | ICD-10-CM | POA: Diagnosis not present

## 2020-09-13 DIAGNOSIS — N179 Acute kidney failure, unspecified: Secondary | ICD-10-CM | POA: Diagnosis not present

## 2020-09-13 LAB — HEMOGLOBIN A1C
Hgb A1c MFr Bld: 8.7 % — ABNORMAL HIGH (ref 4.8–5.6)
Mean Plasma Glucose: 202.99 mg/dL

## 2020-09-13 LAB — CBC
HCT: 24.7 % — ABNORMAL LOW (ref 36.0–46.0)
Hemoglobin: 8.2 g/dL — ABNORMAL LOW (ref 12.0–15.0)
MCH: 28.3 pg (ref 26.0–34.0)
MCHC: 33.2 g/dL (ref 30.0–36.0)
MCV: 85.2 fL (ref 80.0–100.0)
Platelets: 221 10*3/uL (ref 150–400)
RBC: 2.9 MIL/uL — ABNORMAL LOW (ref 3.87–5.11)
RDW: 12.8 % (ref 11.5–15.5)
WBC: 7.7 10*3/uL (ref 4.0–10.5)
nRBC: 0 % (ref 0.0–0.2)

## 2020-09-13 LAB — COMPREHENSIVE METABOLIC PANEL
ALT: 8 U/L (ref 0–44)
AST: 11 U/L — ABNORMAL LOW (ref 15–41)
Albumin: 2.9 g/dL — ABNORMAL LOW (ref 3.5–5.0)
Alkaline Phosphatase: 59 U/L (ref 38–126)
Anion gap: 8 (ref 5–15)
BUN: 49 mg/dL — ABNORMAL HIGH (ref 6–20)
CO2: 23 mmol/L (ref 22–32)
Calcium: 8.7 mg/dL — ABNORMAL LOW (ref 8.9–10.3)
Chloride: 106 mmol/L (ref 98–111)
Creatinine, Ser: 5.6 mg/dL — ABNORMAL HIGH (ref 0.44–1.00)
GFR, Estimated: 8 mL/min — ABNORMAL LOW (ref >60)
Glucose, Bld: 137 mg/dL — ABNORMAL HIGH (ref 70–99)
Potassium: 3.3 mmol/L — ABNORMAL LOW (ref 3.5–5.1)
Sodium: 137 mmol/L (ref 135–145)
Total Bilirubin: 0.3 mg/dL (ref 0.3–1.2)
Total Protein: 6.7 g/dL (ref 6.5–8.1)

## 2020-09-13 LAB — GLUCOSE, CAPILLARY
Glucose-Capillary: 134 mg/dL — ABNORMAL HIGH (ref 70–99)
Glucose-Capillary: 170 mg/dL — ABNORMAL HIGH (ref 70–99)
Glucose-Capillary: 188 mg/dL — ABNORMAL HIGH (ref 70–99)
Glucose-Capillary: 254 mg/dL — ABNORMAL HIGH (ref 70–99)

## 2020-09-13 LAB — PHOSPHORUS: Phosphorus: 5.2 mg/dL — ABNORMAL HIGH (ref 2.5–4.6)

## 2020-09-13 LAB — SODIUM, URINE, RANDOM: Sodium, Ur: 58 mmol/L

## 2020-09-13 LAB — URIC ACID: Uric Acid, Serum: 6.8 mg/dL (ref 2.5–7.1)

## 2020-09-13 LAB — MAGNESIUM: Magnesium: 2.1 mg/dL (ref 1.7–2.4)

## 2020-09-13 LAB — OSMOLALITY, URINE: Osmolality, Ur: 257 mOsm/kg — ABNORMAL LOW (ref 300–900)

## 2020-09-13 MED ORDER — HYDRALAZINE HCL 20 MG/ML IJ SOLN
10.0000 mg | INTRAMUSCULAR | Status: DC | PRN
  Administered 2020-09-13 – 2020-09-14 (×2): 10 mg via INTRAVENOUS
  Filled 2020-09-13 (×2): qty 1

## 2020-09-13 MED ORDER — CARVEDILOL 25 MG PO TABS
25.0000 mg | ORAL_TABLET | Freq: Two times a day (BID) | ORAL | Status: DC
  Administered 2020-09-13 – 2020-09-17 (×9): 25 mg via ORAL
  Filled 2020-09-13 (×9): qty 1

## 2020-09-13 MED ORDER — POTASSIUM CHLORIDE CRYS ER 20 MEQ PO TBCR
40.0000 meq | EXTENDED_RELEASE_TABLET | Freq: Once | ORAL | Status: AC
  Administered 2020-09-13: 40 meq via ORAL
  Filled 2020-09-13: qty 2

## 2020-09-13 NOTE — Progress Notes (Signed)
PROGRESS NOTE  Hannah [name] [id number] DOB: [birth year]   PCP: Vernie Shanks, MD  Patient is from: Home.  Lives with her daughter.  Uses walker and wheelchair at baseline.  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  Patient has had decreased p.o. intake and nausea for days prior to presenting to PCP.  She is also on ARB/HCTZ for blood pressure.  On admission, Cr up to 6.3 from baseline of 4.0.  Started on IV fluid.  Renal ultrasound without acute finding.  Creatinine improving.  Subjective: Seen and examined earlier this morning.  No major events overnight or this morning.  No complaints.  She denies chest pain, dyspnea, GI or UTI symptoms.  Denies change in urinary output.  Objective: Vitals:   09/13/20 0458 09/13/20 0608 09/13/20 0722 09/13/20 1001  BP: (!) 178/81 (!) 175/82 (!) 159/77 (!) 144/78  Pulse: 79 82 85 85  Resp:    16  Temp:    98.6 F (37 C)  TempSrc:    Oral  SpO2:    100%    Intake/Output Summary (Last 24 hours) at 09/13/2020 1125 Last data filed at 09/13/2020 [id number] Gross per 24 hour  Intake 1050 ml  Output 550 ml  Net 500 ml   There were no vitals filed for this visit.  Examination:  GENERAL: No apparent distress.  Nontoxic. HEENT: MMM.  Vision and hearing grossly intact.  NECK: Supple.  No apparent JVD.  RESP: On RA.  No IWOB.  Fair aeration bilaterally. CVS:  RRR. Heart sounds normal.  ABD/GI/GU: BS+. Abd soft, NTND.  MSK/EXT:  Moves extremities. No apparent deformity. No edema.  SKIN: no apparent skin lesion or wound NEURO: Awake, alert and oriented appropriately.  Residual aphasia and RUE weakness.  PSYCH: Calm. Normal affect.  Procedures:  None  Microbiology summarized: [id number] and influenza PCR  nonreactive.  Assessment & Plan: AKI on CKD-5 with azotemia: Suspect prerenal etiology from poor p.o. intake and nausea.  Also on HCTZ and losartan which could contribute.  She denies NSAID use.  UA with > 300 protein.  Renal US without acute finding.  Seen by nephrologist over a year ago and did not follow-up. Recent Labs    06/09/20 0750 06/09/20 0803 06/11/20 1205 06/12/20 0204 06/12/20 1605 06/13/20 0809 06/17/20 0454 06/23/20 0646 09/12/20 1859 09/12/20 2025 09/13/20 0438  BUN 39* 42* 30* 31*  --  29* 36* 27* 53* 47* 49*  CREATININE 4.11* 4.30* 3.70* 3.67* 3.65* 3.98* 4.42* 3.92* 5.85* 6.30* 5.60*  -Continue IV fluid -Check UPC and uric acid -Nephrology consulted on admission and suggested reconsult if worse. -Needs to [place] care with her nephrologist on discharge.  Patient and her daughter do not remember the nephrologist she saw in the past.   Anemia of renal disease: some drop in Hgb likely dilutional Recent Labs    06/09/20 0750 06/09/20 0803 06/12/20 1605 06/13/20 0809 06/14/20 0553 06/19/20 1013 09/12/20 1859 09/12/20 2025 09/13/20 0438  HGB 11.0* 10.5* 9.6* 7.8* 7.7* 8.8* 10.3* 10.5* 8.2*  -Continue monitoring -Check anemia panel in the morning  History of CVA with residual aphasia and RUE weakness: Stable -continue Statin and Plavix -Assess mobility per protocol  Uncontrolled hypertension -Continue home amlodipine -Change metoprolol to Coreg for better blood pressure control -Discontinue HCTZ given  her renal function -Continue holding losartan  Uncontrolled IDDM-2 with hyperglycemia, CKD-5 and macrovascular complication: [id number] in 05/2020 No results for input(s): HGBA1C in the last 72 hours. Recent Labs  Lab 09/12/20 2341 09/13/20 0722  GLUCAP 182* 134*  -Continue SSI-sensitive -Continue Lantus 10 units at night -Follow A1c -Continue home statin  Hypokalemia: K3.3. -K-Dur 40 mEq x 1  Hyperphosphatemia: P 5.2.  Improved. -Recheck  in the morning  Hyponatremia: Resolved.  There is no height or weight on file to calculate BMI.         DVT prophylaxis:  SCDs Start: 09/12/20 2153  Code Status: Full code Family Communication: Updated patient's daughter over the phone Level of care: Med-Surg Status is: Observation  The patient will require care spanning > 2 midnights and should be moved to inpatient because: Persistent severe electrolyte disturbances, IV treatments appropriate due to intensity of illness or inability to take PO and Inpatient level of care appropriate due to severity of illness  Dispo: The patient is from: Home              Anticipated d/c is to: Home              Patient currently is not medically stable to d/c.   Difficult to place patient No       Consultants:  Nephrology    Sch Meds:  Scheduled Meds: . amLODipine  10 mg Oral Daily  . carvedilol  25 mg Oral BID WC  . clopidogrel  75 mg Oral Daily  . famotidine  20 mg Oral Daily  . FLUoxetine  20 mg Oral Daily  . insulin aspart  0-9 Units Subcutaneous TID WC  . insulin glargine  10 Units Subcutaneous QHS  . melatonin  3 mg Oral QHS  . rosuvastatin  10 mg Oral Daily   Continuous Infusions: . sodium chloride 100 mL/hr (09/13/20 0846)   PRN Meds:.acetaminophen **OR** acetaminophen, albuterol, hydrALAZINE, ondansetron **OR** ondansetron (ZOFRAN) IV  Antimicrobials: Anti-infectives (From admission, onward)   None       I have personally reviewed the following labs and images: CBC: Recent Labs  Lab 09/12/20 1859 09/12/20 2025 09/13/20 0438  WBC 9.1  --  7.7  NEUTROABS 6.2  --   --   HGB 10.3* 10.5* 8.2*  HCT 31.3* 31.0* 24.7*  MCV 85.3  --  85.2  PLT 271  --  221   BMP &GFR Recent Labs  Lab 09/12/20 1859 09/12/20 2025 09/13/20 0438  NA 134* 137 137  K 3.5 3.6 3.3*  CL 97* 98 106  CO2 25  --  23  GLUCOSE 184* 182* 137*  BUN 53* 47* 49*  CREATININE 5.85* 6.30* 5.60*  CALCIUM 9.5  --  8.7*  MG 2.4  --  2.1   PHOS 5.7*  --  5.2*   CrCl cannot be calculated (Unknown ideal weight.). Liver & Pancreas: Recent Labs  Lab 09/12/20 1859 09/13/20 0438  AST 14* 11*  ALT 10 8  ALKPHOS 75 59  BILITOT 0.5 0.3  PROT 8.7* 6.7  ALBUMIN 3.8 2.9*   No results for input(s): LIPASE, AMYLASE in the last 168 hours. No results for input(s): AMMONIA in the last 168 hours. Diabetic: No results for input(s): HGBA1C in the last 72 hours. Recent Labs  Lab 09/12/20 2341 09/13/20 0722  GLUCAP 182* 134*   Cardiac Enzymes: No results for input(s): CKTOTAL, CKMB, CKMBINDEX, TROPONINI in the last 168 hours. No results for input(s): PROBNP in the  last 8760 hours. Coagulation Profile: No results for input(s): INR, PROTIME in the last 168 hours. Thyroid Function Tests: No results for input(s): TSH, T4TOTAL, FREET4, T3FREE, THYROIDAB in the last 72 hours. Lipid Profile: No results for input(s): CHOL, HDL, LDLCALC, TRIG, CHOLHDL, LDLDIRECT in the last 72 hours. Anemia Panel: No results for input(s): VITAMINB12, FOLATE, FERRITIN, TIBC, IRON, RETICCTPCT in the last 72 hours. Urine analysis:    Component Value Date/Time   COLORURINE STRAW (A) 09/12/2020 2208   APPEARANCEUR CLEAR 09/12/2020 2208   LABSPEC 1.007 09/12/2020 2208   PHURINE 6.0 09/12/2020 2208   GLUCOSEU >=500 (A) 09/12/2020 2208   HGBUR NEGATIVE 09/12/2020 2208   BILIRUBINUR NEGATIVE 09/12/2020 2208   KETONESUR NEGATIVE 09/12/2020 2208   PROTEINUR >=300 (A) 09/12/2020 2208   NITRITE NEGATIVE 09/12/2020 2208   LEUKOCYTESUR NEGATIVE 09/12/2020 2208   Sepsis Labs: Invalid input(s): PROCALCITONIN, [place]  Microbiology: Recent Results (from the past 240 hour(s))  Resp Panel by RT-PCR (Flu A&B, Covid) Nasopharyngeal Swab     Status: None   Collection Time: 09/12/20  8:01 PM   Specimen: Nasopharyngeal Swab; Nasopharyngeal(NP) swabs in vial transport medium  Result Value Ref Range Status   SARS Coronavirus 2 by RT PCR NEGATIVE NEGATIVE Final     Comment: (NOTE) SARS-CoV-2 target nucleic acids are NOT DETECTED.  The SARS-CoV-2 RNA is generally detectable in upper respiratory specimens during the acute phase of infection. The lowest concentration of SARS-CoV-2 viral copies this assay can detect is 138 copies/mL. A negative result does not preclude SARS-Cov-2 infection and should not be used as the sole basis for treatment or other patient management decisions. A negative result may occur with  improper specimen collection/handling, submission of specimen other than nasopharyngeal swab, presence of viral mutation(s) within the areas targeted by this assay, and inadequate number of viral copies(<138 copies/mL). A negative result must be combined with clinical observations, patient history, and epidemiological information. The expected result is Negative.  Fact Sheet for Patients:  EntrepreneurPulse.com.au  Fact Sheet for Healthcare Providers:  IncredibleEmployment.be  This test is no t yet approved or cleared by the [place] FDA and  has been authorized for detection and/or diagnosis of SARS-CoV-2 by FDA under an Emergency Use Authorization (EUA). This EUA will remain  in effect (meaning this test can be used) for the duration of the COVID-19 declaration under Section 564(b)(1) of the Act, 21 U.S.C.section 360bbb-3(b)(1), unless the authorization is terminated  or revoked sooner.       Influenza A by PCR NEGATIVE NEGATIVE Final   Influenza B by PCR NEGATIVE NEGATIVE Final    Comment: (NOTE) The Xpert Xpress SARS-CoV-2/FLU/RSV plus assay is intended as an aid in the diagnosis of influenza from Nasopharyngeal swab specimens and should not be used as a sole basis for treatment. Nasal washings and aspirates are unacceptable for Xpert Xpress SARS-CoV-2/FLU/RSV testing.  Fact Sheet for Patients: EntrepreneurPulse.com.au  Fact Sheet for Healthcare  Providers: IncredibleEmployment.be  This test is not yet approved or cleared by the [place] FDA and has been authorized for detection and/or diagnosis of SARS-CoV-2 by FDA under an Emergency Use Authorization (EUA). This EUA will remain in effect (meaning this test can be used) for the duration of the COVID-19 declaration under Section 564(b)(1) of the Act, 21 U.S.C. section 360bbb-3(b)(1), unless the authorization is terminated or revoked.  Performed at [facility], [place] [address], [place],  [address]     Radiology Studies: US Renal  Result Date: 09/12/2020 CLINICAL  DATA:  Acute renal failure. EXAM: RENAL / URINARY TRACT ULTRASOUND COMPLETE COMPARISON:  None. FINDINGS: Right Kidney: Renal measurements: 9.4 cm x 4.0 cm x 4.7 cm = volume: 92.0 mL. Diffusely increased echogenicity of the renal parenchyma is noted. A 2.7 cm x 2.7 cm x 2.5 cm anechoic structure is seen within the right kidney. No abnormal flow is noted within this region on color Doppler evaluation. No hydronephrosis is visualized. Left Kidney: Renal measurements: 9.9 cm x 6.0 cm x 5.8 cm = volume: 182.1 mL. Diffusely increased echogenicity of the renal parenchyma is noted. A 1.2 cm x 1.4 cm x 1.0 cm anechoic structure is seen within the left kidney. No abnormal flow is noted within this region on color Doppler evaluation. No hydronephrosis is visualized. Bladder: Appears normal for degree of bladder distention. Bilateral ureteral jets are visualized. Other: None. IMPRESSION: 1. Bilateral simple renal cysts. 2. Increased echogenicity of the kidneys which may be secondary to medical renal disease. Electronically Signed   By: Virgina Norfolk M.D.   On: 09/12/2020 20:54      Argie Lober T. [place]  If 7PM-7AM, please contact night-coverage [email or web address] 09/13/2020, 11:25 AM

## 2020-09-14 ENCOUNTER — Inpatient Hospital Stay (HOSPITAL_COMMUNITY): Payer: Medicare Other

## 2020-09-14 DIAGNOSIS — D631 Anemia in chronic kidney disease: Secondary | ICD-10-CM | POA: Diagnosis present

## 2020-09-14 DIAGNOSIS — N185 Chronic kidney disease, stage 5: Secondary | ICD-10-CM | POA: Diagnosis present

## 2020-09-14 DIAGNOSIS — E860 Dehydration: Secondary | ICD-10-CM | POA: Diagnosis present

## 2020-09-14 DIAGNOSIS — R1031 Right lower quadrant pain: Secondary | ICD-10-CM | POA: Diagnosis present

## 2020-09-14 DIAGNOSIS — Z7902 Long term (current) use of antithrombotics/antiplatelets: Secondary | ICD-10-CM | POA: Diagnosis not present

## 2020-09-14 DIAGNOSIS — N179 Acute kidney failure, unspecified: Secondary | ICD-10-CM | POA: Diagnosis present

## 2020-09-14 DIAGNOSIS — Z7984 Long term (current) use of oral hypoglycemic drugs: Secondary | ICD-10-CM | POA: Diagnosis not present

## 2020-09-14 DIAGNOSIS — I120 Hypertensive chronic kidney disease with stage 5 chronic kidney disease or end stage renal disease: Secondary | ICD-10-CM | POA: Diagnosis present

## 2020-09-14 DIAGNOSIS — E871 Hypo-osmolality and hyponatremia: Secondary | ICD-10-CM | POA: Diagnosis present

## 2020-09-14 DIAGNOSIS — Z887 Allergy status to serum and vaccine status: Secondary | ICD-10-CM | POA: Diagnosis not present

## 2020-09-14 DIAGNOSIS — Z888 Allergy status to other drugs, medicaments and biological substances status: Secondary | ICD-10-CM | POA: Diagnosis not present

## 2020-09-14 DIAGNOSIS — D649 Anemia, unspecified: Secondary | ICD-10-CM | POA: Diagnosis not present

## 2020-09-14 DIAGNOSIS — E876 Hypokalemia: Secondary | ICD-10-CM | POA: Diagnosis present

## 2020-09-14 DIAGNOSIS — Z886 Allergy status to analgesic agent status: Secondary | ICD-10-CM | POA: Diagnosis not present

## 2020-09-14 DIAGNOSIS — E1122 Type 2 diabetes mellitus with diabetic chronic kidney disease: Secondary | ICD-10-CM | POA: Diagnosis present

## 2020-09-14 DIAGNOSIS — I69351 Hemiplegia and hemiparesis following cerebral infarction affecting right dominant side: Secondary | ICD-10-CM | POA: Diagnosis not present

## 2020-09-14 DIAGNOSIS — E1165 Type 2 diabetes mellitus with hyperglycemia: Secondary | ICD-10-CM | POA: Diagnosis present

## 2020-09-14 DIAGNOSIS — Z8673 Personal history of transient ischemic attack (TIA), and cerebral infarction without residual deficits: Secondary | ICD-10-CM | POA: Diagnosis not present

## 2020-09-14 DIAGNOSIS — R5381 Other malaise: Secondary | ICD-10-CM

## 2020-09-14 DIAGNOSIS — N281 Cyst of kidney, acquired: Secondary | ICD-10-CM | POA: Diagnosis present

## 2020-09-14 DIAGNOSIS — E785 Hyperlipidemia, unspecified: Secondary | ICD-10-CM | POA: Diagnosis present

## 2020-09-14 DIAGNOSIS — I69320 Aphasia following cerebral infarction: Secondary | ICD-10-CM | POA: Diagnosis not present

## 2020-09-14 DIAGNOSIS — Z885 Allergy status to narcotic agent status: Secondary | ICD-10-CM | POA: Diagnosis not present

## 2020-09-14 DIAGNOSIS — I10 Essential (primary) hypertension: Secondary | ICD-10-CM

## 2020-09-14 DIAGNOSIS — Z20822 Contact with and (suspected) exposure to covid-19: Secondary | ICD-10-CM | POA: Diagnosis present

## 2020-09-14 DIAGNOSIS — G35 Multiple sclerosis: Secondary | ICD-10-CM | POA: Diagnosis present

## 2020-09-14 DIAGNOSIS — E1322 Other specified diabetes mellitus with diabetic chronic kidney disease: Secondary | ICD-10-CM | POA: Diagnosis not present

## 2020-09-14 DIAGNOSIS — Z794 Long term (current) use of insulin: Secondary | ICD-10-CM | POA: Diagnosis not present

## 2020-09-14 DIAGNOSIS — Z79899 Other long term (current) drug therapy: Secondary | ICD-10-CM | POA: Diagnosis not present

## 2020-09-14 LAB — CBC
HCT: 23 % — ABNORMAL LOW (ref 36.0–46.0)
Hemoglobin: 7.4 g/dL — ABNORMAL LOW (ref 12.0–15.0)
MCH: 28.2 pg (ref 26.0–34.0)
MCHC: 32.2 g/dL (ref 30.0–36.0)
MCV: 87.8 fL (ref 80.0–100.0)
Platelets: 205 10*3/uL (ref 150–400)
RBC: 2.62 MIL/uL — ABNORMAL LOW (ref 3.87–5.11)
RDW: 13.2 % (ref 11.5–15.5)
WBC: 6.3 10*3/uL (ref 4.0–10.5)
nRBC: 0 % (ref 0.0–0.2)

## 2020-09-14 LAB — GLUCOSE, CAPILLARY
Glucose-Capillary: 129 mg/dL — ABNORMAL HIGH (ref 70–99)
Glucose-Capillary: 146 mg/dL — ABNORMAL HIGH (ref 70–99)
Glucose-Capillary: 112 mg/dL — ABNORMAL HIGH (ref 70–99)
Glucose-Capillary: 152 mg/dL — ABNORMAL HIGH (ref 70–99)

## 2020-09-14 LAB — CK: Total CK: 34 U/L — ABNORMAL LOW (ref 38–234)

## 2020-09-14 LAB — RENAL FUNCTION PANEL
Albumin: 2.5 g/dL — ABNORMAL LOW (ref 3.5–5.0)
Anion gap: 6 (ref 5–15)
BUN: 46 mg/dL — ABNORMAL HIGH (ref 6–20)
CO2: 21 mmol/L — ABNORMAL LOW (ref 22–32)
Calcium: 8.5 mg/dL — ABNORMAL LOW (ref 8.9–10.3)
Chloride: 113 mmol/L — ABNORMAL HIGH (ref 98–111)
Creatinine, Ser: 5.3 mg/dL — ABNORMAL HIGH (ref 0.44–1.00)
GFR, Estimated: 9 mL/min — ABNORMAL LOW (ref >60)
Glucose, Bld: 144 mg/dL — ABNORMAL HIGH (ref 70–99)
Phosphorus: 4.9 mg/dL — ABNORMAL HIGH (ref 2.5–4.6)
Potassium: 3.8 mmol/L (ref 3.5–5.1)
Sodium: 140 mmol/L (ref 135–145)

## 2020-09-14 LAB — FOLATE: Folate: 8.4 ng/mL (ref >5.9)

## 2020-09-14 LAB — HEMOGLOBIN AND HEMATOCRIT, BLOOD
HCT: 27.3 % — ABNORMAL LOW (ref 36.0–46.0)
Hemoglobin: 8.9 g/dL — ABNORMAL LOW (ref 12.0–15.0)

## 2020-09-14 LAB — RETICULOCYTES
Immature Retic Fract: 10.5 % (ref 2.3–15.9)
RBC.: 2.67 MIL/uL — ABNORMAL LOW (ref 3.87–5.11)
Retic Count, Absolute: 31.8 10*3/uL (ref 19.0–186.0)
Retic Ct Pct: 1.2 % (ref 0.4–3.1)

## 2020-09-14 LAB — IRON AND TIBC
Iron: 44 ug/dL (ref 28–170)
Saturation Ratios: 20 % (ref 10.4–31.8)
TIBC: 220 ug/dL — ABNORMAL LOW (ref 250–450)
UIBC: 176 ug/dL

## 2020-09-14 LAB — PREPARE RBC (CROSSMATCH)

## 2020-09-14 LAB — FERRITIN: Ferritin: 25 ng/mL (ref 11–307)

## 2020-09-14 LAB — VITAMIN B12: Vitamin B-12: 264 pg/mL (ref 180–914)

## 2020-09-14 LAB — ABO/RH: ABO/RH(D): A POS

## 2020-09-14 LAB — MAGNESIUM: Magnesium: 2 mg/dL (ref 1.7–2.4)

## 2020-09-14 MED ORDER — MORPHINE SULFATE (PF) 4 MG/ML IV SOLN
4.0000 mg | Freq: Once | INTRAVENOUS | Status: AC
Start: 2020-09-14 — End: 2020-09-14
  Administered 2020-09-14: 4 mg via INTRAVENOUS
  Filled 2020-09-14: qty 1

## 2020-09-14 MED ORDER — DIPHENHYDRAMINE HCL 50 MG/ML IJ SOLN
25.0000 mg | Freq: Once | INTRAMUSCULAR | Status: AC
  Administered 2020-09-14: 25 mg via INTRAVENOUS
  Filled 2020-09-14: qty 1

## 2020-09-14 MED ORDER — HYDRALAZINE HCL 25 MG PO TABS
25.0000 mg | ORAL_TABLET | Freq: Three times a day (TID) | ORAL | Status: DC
  Administered 2020-09-14 – 2020-09-15 (×4): 25 mg via ORAL
  Filled 2020-09-14 (×4): qty 1

## 2020-09-14 MED ORDER — SODIUM CHLORIDE 0.9% IV SOLUTION: Freq: Once | INTRAVENOUS | Status: AC

## 2020-09-14 NOTE — Progress Notes (Signed)
PROGRESS NOTE  Hannah [name] [id number] DOB: 05/09/1966   PCP: Vernie Shanks, MD  Patient is from: Home.  Lives with her daughter.  Uses walker and wheelchair at baseline.  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  Patient has had decreased p.o. intake and nausea for days prior to presenting to PCP.  She is also on ARB/HCTZ for blood pressure.  On admission, Cr up to 6.3 from baseline of 4.0.  Started on IV fluid.  Renal ultrasound without acute finding.  Creatinine improving.  Subjective: Seen and examined earlier this morning.  No major events overnight of this morning.  No complaints.  She denies chest pain, dyspnea, GI or UTI symptoms.  Denies melena or hematochezia.  Objective: Vitals:   09/13/20 1001 09/13/20 1659 09/13/20 2055 09/14/20 0611  BP: (!) 144/78 (!) 160/77 (!) 149/74 (!) 176/80  Pulse: 85 89 90 95  Resp: '16 16 18 18  '$ Temp: 98.6 F (37 C) 98.5 F (36.9 C) 98.7 F (37.1 C) 98.9 F (37.2 C)  TempSrc: Oral Oral Oral Oral  SpO2: 100% 99% 100% 99%    Intake/Output Summary (Last 24 hours) at 09/14/2020 1222 Last data filed at 09/14/2020 0615 Gross per 24 hour  Intake 2870.22 ml  Output 1200 ml  Net 1670.22 ml   There were no vitals filed for this visit.  Examination:  GENERAL: No apparent distress.  Nontoxic. HEENT: MMM.  Vision and hearing grossly intact.  NECK: Supple.  No apparent JVD.  RESP:  No IWOB.  Fair aeration bilaterally. CVS:  RRR. Heart sounds normal.  ABD/GI/GU: BS+. Abd soft, NTND.  MSK/EXT:  Moves extremities. No apparent deformity. No edema.  SKIN: no apparent skin lesion or wound NEURO: Awake, alert and oriented appropriately.  Residual aphasia and RUE weakness. PSYCH: Calm. Normal  affect.  Procedures:  None  Microbiology summarized: [id number] and influenza PCR nonreactive.  Assessment & Plan: AKI on CKD-5 with azotemia: Suspect prerenal etiology from poor p.o. intake and nausea.  Also on HCTZ and losartan which could contribute.  She denies NSAID use.  UA with > 300 protein.  Renal US negative.  Uric acid 6.8.  Seen by nephrologist over a year ago and did not follow-up. Recent Labs    06/09/20 0803 06/11/20 1205 06/12/20 0204 06/12/20 1605 06/13/20 0809 06/17/20 0454 06/23/20 0646 09/12/20 1859 09/12/20 2025 09/13/20 0438 09/14/20 0546  BUN 42* 30* 31*  --  29* 36* 27* 53* 47* 49* 46*  CREATININE 4.30* 3.70* 3.67* 3.65* 3.98* 4.42* 3.92* 5.85* 6.30* 5.60* 5.30*  -Continue IV fluid -Follow [facility] -Nephrology consulted on admission and suggested reconsult if worse. -Needs to [place] care with her nephrologist on discharge.   Anemia of renal disease: some drop in Hgb likely dilutional.  Denies melena or hematochezia. Recent Labs    06/09/20 0750 06/09/20 0803 06/12/20 1605 06/13/20 0809 06/14/20 0553 06/19/20 1013 09/12/20 1859 09/12/20 2025 09/13/20 0438 09/14/20 0546  HGB 11.0* 10.5* 9.6* 7.8* 7.7* 8.8* 10.3* 10.5* 8.2* 7.4*  -Check anemia panel -Transfuse 1 unit.  Verbally consented -Continue monitoring  History of CVA with residual aphasia and RUE weakness: Stable -Continue Statin and Plavix -Assess mobility per protocol  Uncontrolled hypertension: SBP elevated to 170s. -Continue home amlodipine -Changed metoprolol to Coreg for better blood pressure  control on 4/30 -Start hydralazine 25 mg 3 times daily -Continue as needed hydralazine as well -Discontinue HCTZ given her renal function -Continue holding losartan  Uncontrolled IDDM-2 with hyperglycemia, CKD-5 and macrovascular complication: [id number]. Recent Labs  Lab 09/13/20 0722 09/13/20 1226 09/13/20 1643 09/13/20 2051 09/14/20 0724  GLUCAP 134* 170* 188* 254* 129*   -Continue SSI-sensitive -Continue Lantus 10 units at night -Continue home statin  Hypokalemia: Resolved.  Hyperphosphatemia: P 4.9.  Improved. -Recheck in the morning  Hyponatremia: Resolved.  Debility: Walker and wheelchair dependent at baseline. -PT/OT  There is no height or weight on file to calculate BMI.         DVT prophylaxis:  SCDs Start: 09/12/20 2153  Code Status: Full code Family Communication: Updated patient's daughter over the phone on 4/30 Level of care: Med-Surg Status is: Inpatient  Remains inpatient appropriate because:IV treatments appropriate due to intensity of illness or inability to take PO and Inpatient level of care appropriate due to severity of illness   Dispo: The patient is from: Home              Anticipated d/c is to: Home              Patient currently is not medically stable to d/c.   Difficult to place patient No             Consultants:  Nephrology on admission   Sch Meds:  Scheduled Meds: . sodium chloride   Intravenous Once  . amLODipine  10 mg Oral Daily  . carvedilol  25 mg Oral BID WC  . clopidogrel  75 mg Oral Daily  . famotidine  20 mg Oral Daily  . FLUoxetine  20 mg Oral Daily  . hydrALAZINE  25 mg Oral Q8H  . insulin aspart  0-9 Units Subcutaneous TID WC  . insulin glargine  10 Units Subcutaneous QHS  . melatonin  3 mg Oral QHS  . rosuvastatin  10 mg Oral Daily   Continuous Infusions: . sodium chloride 75 mL/hr at 09/14/20 0859   PRN Meds:.acetaminophen **OR** acetaminophen, albuterol, hydrALAZINE, ondansetron **OR** ondansetron (ZOFRAN) IV  Antimicrobials: Anti-infectives (From admission, onward)   None       I have personally reviewed the following labs and images: CBC: Recent Labs  Lab 09/12/20 1859 09/12/20 2025 09/13/20 0438 09/14/20 0546  WBC 9.1  --  7.7 6.3  NEUTROABS 6.2  --   --   --   HGB 10.3* 10.5* 8.2* 7.4*  HCT 31.3* 31.0* 24.7* 23.0*  MCV 85.3  --  85.2 87.8  PLT 271   --  221 205   BMP &GFR Recent Labs  Lab 09/12/20 1859 09/12/20 2025 09/13/20 0438 09/14/20 0546  NA 134* 137 137 140  K 3.5 3.6 3.3* 3.8  CL 97* 98 106 113*  CO2 25  --  23 21*  GLUCOSE 184* 182* 137* 144*  BUN 53* 47* 49* 46*  CREATININE 5.85* 6.30* 5.60* 5.30*  CALCIUM 9.5  --  8.7* 8.5*  MG 2.4  --  2.1 2.0  PHOS 5.7*  --  5.2* 4.9*   CrCl cannot be calculated (Unknown ideal weight.). Liver & Pancreas: Recent Labs  Lab 09/12/20 1859 09/13/20 0438 09/14/20 0546  AST 14* 11*  --   ALT 10 8  --   ALKPHOS 75 59  --   BILITOT 0.5 0.3  --   PROT 8.7* 6.7  --   ALBUMIN 3.8 2.9* 2.5*  No results for input(s): LIPASE, AMYLASE in the last 168 hours. No results for input(s): AMMONIA in the last 168 hours. Diabetic: Recent Labs    09/13/20 0438  HGBA1C 8.7*   Recent Labs  Lab 09/13/20 0722 09/13/20 1226 09/13/20 1643 09/13/20 2051 09/14/20 0724  GLUCAP 134* 170* 188* 254* 129*   Cardiac Enzymes: Recent Labs  Lab 09/14/20 0546  CKTOTAL 34*   No results for input(s): PROBNP in the last 8760 hours. Coagulation Profile: No results for input(s): INR, PROTIME in the last 168 hours. Thyroid Function Tests: No results for input(s): TSH, T4TOTAL, FREET4, T3FREE, THYROIDAB in the last 72 hours. Lipid Profile: No results for input(s): CHOL, HDL, LDLCALC, TRIG, CHOLHDL, LDLDIRECT in the last 72 hours. Anemia Panel: Recent Labs    09/14/20 1052  RETICCTPCT 1.2   Urine analysis:    Component Value Date/Time   COLORURINE STRAW (A) 09/12/2020 2208   APPEARANCEUR CLEAR 09/12/2020 2208   LABSPEC 1.007 09/12/2020 2208   PHURINE 6.0 09/12/2020 2208   GLUCOSEU >=500 (A) 09/12/2020 2208   HGBUR NEGATIVE 09/12/2020 2208   [place] 09/12/2020 2208   KETONESUR NEGATIVE 09/12/2020 2208   PROTEINUR >=300 (A) 09/12/2020 2208   NITRITE NEGATIVE 09/12/2020 2208   LEUKOCYTESUR NEGATIVE 09/12/2020 2208   Sepsis Labs: Invalid input(s): PROCALCITONIN,  [place]  Microbiology: Recent Results (from the past 240 hour(s))  Resp Panel by RT-PCR (Flu A&B, Covid) Nasopharyngeal Swab     Status: None   Collection Time: 09/12/20  8:01 PM   Specimen: Nasopharyngeal Swab; Nasopharyngeal(NP) swabs in vial transport medium  Result Value Ref Range Status   SARS Coronavirus 2 by RT PCR NEGATIVE NEGATIVE Final    Comment: (NOTE) SARS-CoV-2 target nucleic acids are NOT DETECTED.  The SARS-CoV-2 RNA is generally detectable in upper respiratory specimens during the acute phase of infection. The lowest concentration of SARS-CoV-2 viral copies this assay can detect is 138 copies/mL. A negative result does not preclude SARS-Cov-2 infection and should not be used as the sole basis for treatment or other patient management decisions. A negative result may occur with  improper specimen collection/handling, submission of specimen other than nasopharyngeal swab, presence of viral mutation(s) within the areas targeted by this assay, and inadequate number of viral copies(<138 copies/mL). A negative result must be combined with clinical observations, patient history, and epidemiological information. The expected result is Negative.  Fact Sheet for Patients:  EntrepreneurPulse.com.au  Fact Sheet for Healthcare Providers:  IncredibleEmployment.be  This test is no t yet approved or cleared by the [place] FDA and  has been authorized for detection and/or diagnosis of SARS-CoV-2 by FDA under an Emergency Use Authorization (EUA). This EUA will remain  in effect (meaning this test can be used) for the duration of the COVID-19 declaration under Section 564(b)(1) of the Act, 21 U.S.C.section 360bbb-3(b)(1), unless the authorization is terminated  or revoked sooner.       Influenza A by PCR NEGATIVE NEGATIVE Final   Influenza B by PCR NEGATIVE NEGATIVE Final    Comment: (NOTE) The Xpert Xpress SARS-CoV-2/FLU/RSV plus  assay is intended as an aid in the diagnosis of influenza from Nasopharyngeal swab specimens and should not be used as a sole basis for treatment. Nasal washings and aspirates are unacceptable for Xpert Xpress SARS-CoV-2/FLU/RSV testing.  Fact Sheet for Patients: EntrepreneurPulse.com.au  Fact Sheet for Healthcare Providers: IncredibleEmployment.be  This test is not yet approved or cleared by the [place] FDA and has been authorized for detection and/or  diagnosis of SARS-CoV-2 by FDA under an Emergency Use Authorization (EUA). This EUA will remain in effect (meaning this test can be used) for the duration of the COVID-19 declaration under Section 564(b)(1) of the Act, 21 U.S.C. section 360bbb-3(b)(1), unless the authorization is terminated or revoked.  Performed at [facility], [place] [address], [place], [place] [address]     Radiology Studies: No results found.    Sulo Janczak T. [place]  If 7PM-7AM, please contact night-coverage [email or web address] 09/14/2020, 12:22 PM

## 2020-09-14 NOTE — Progress Notes (Signed)
Patient with 10/10 abdominal pain and headache post 1 unit of packed red blood cells. States pain sharp in nature. Also appears weak on assessment. See flow sheet for vital signs and assessment. AClearence Ped, DO notified and made aware. Will continue to monitor patient.

## 2020-09-14 NOTE — Evaluation (Signed)
Physical Therapy Evaluation Patient Details Name: Hannah [name] MRN: [id number] DOB: 1965/10/21 Today's Date: 09/14/2020   History of Present Illness  Zamora Elfering is a 55 y.o. female with medical history significant of CVA with resultant L hemiparesis; MS; HTN; HLD; CKD and DM presenting with R sided weakness, vision changes, and dyphasia.  Clinical Impression  Pt admitted with above diagnosis. Pt agreeable to OOB. Min to min/guard overall.  Will follow in acute setting, doubt pt will need f/u PT post acute. Pt currently with functional limitations due to the deficits listed below (see PT Problem List). Pt will benefit from skilled PT to increase their independence and safety with mobility to allow discharge to the venue listed below.       Follow Up Recommendations No PT follow up    Equipment Recommendations  None recommended by PT    Recommendations for Other Services       Precautions / Restrictions Precautions Precautions: Fall Restrictions Weight Bearing Restrictions: No      Mobility  Bed Mobility Overal bed mobility: Needs Assistance Bed Mobility: Supine to Sit     Supine to sit: Min guard;Min assist     General bed mobility comments: light assist to facilitate movement LEs, incr time to elevate trunk    Transfers Overall transfer level: Needs assistance Equipment used: Rolling walker (2 wheeled) Transfers: Sit to/from [facility] Sit to Stand: Min assist;Min guard Stand pivot transfers: Min assist       General transfer comment: incr time to come to full stand. light assist to rise and transition to RW, steadying assist to pivot  Ambulation/Gait                Stairs            Wheelchair Mobility    Modified Rankin (Stroke Patients Only)       Balance Overall balance assessment: Needs assistance Sitting-balance support: Feet supported Sitting balance-Leahy Scale: Fair       Standing balance-Leahy Scale:  Poor Standing balance comment: reliant on UEs                             Pertinent Vitals/Pain Pain Assessment: No/denies pain    Home Living Family/patient expects to be discharged to:: Private residence Living Arrangements: Children Available Help at Discharge: Family;Available PRN/intermittently;Available 24 hours/day   Home Access: Level entry     Home Layout: One level Home Equipment: Walker - 4 wheels;Cane - single point;Wheelchair - Education officer, community - power Additional Comments: pt reports she has a Risk manager chair currently that is a little too large for her, she is waiting on her permanent power chair    Prior Function Level of Independence: Needs assistance   Gait / Transfers Assistance Needed: amb with RW short distances in apt, also uses power chair vs manual chair inside and for longer distances  ADL's / Homemaking Assistance Needed: family assist with self care        Hand Dominance        Extremity/Trunk Assessment   Upper Extremity Assessment Upper Extremity Assessment: Defer to OT evaluation RUE Coordination: decreased fine motor    Lower Extremity Assessment Lower Extremity Assessment: RLE deficits/detail RLE Deficits / Details: grossly WFL, at least 3/5 to 3+/5 for activities tested RLE Coordination: decreased gross motor       Communication   Communication: No difficulties  Cognition Arousal/Alertness: Awake/alert Behavior During Therapy: WFL for tasks assessed/performed  Overall Cognitive Status: Within Functional Limits for tasks assessed                                        General Comments      Exercises     Assessment/Plan    PT Assessment Patient needs continued PT services  PT Problem List Decreased strength;Decreased mobility;Decreased activity tolerance;Decreased balance;Decreased coordination       PT Treatment Interventions DME instruction;Therapeutic exercise;Gait training;Functional  mobility training;Therapeutic activities;Patient/family education;Balance training    PT Goals (Current goals can be found in the Care Plan section)  Acute Rehab PT Goals Patient Stated Goal: home PT Goal Formulation: With patient Time For Goal Achievement: 09/28/20 Potential to Achieve Goals: Good    Frequency Min 3X/week   Barriers to discharge        Co-evaluation               AM-PAC PT "6 Clicks" Mobility  Outcome Measure Help needed turning from your back to your side while in a flat bed without using bedrails?: A Little Help needed moving from lying on your back to sitting on the side of a flat bed without using bedrails?: A Little Help needed moving to and from a bed to a chair (including a wheelchair)?: A Little Help needed standing up from a chair using your arms (e.g., wheelchair or bedside chair)?: A Little Help needed to walk in hospital room?: A Lot Help needed climbing 3-5 steps with a railing? : A Lot 6 Click Score: 16    End of Session Equipment Utilized During Treatment: Gait belt Activity Tolerance: Patient tolerated treatment well Patient left: in chair;with call bell/phone within reach;with chair alarm set;with family/visitor present;with nursing/sitter in room Nurse Communication: Mobility status PT Visit Diagnosis: Other abnormalities of gait and mobility (R26.89)    Time: [id number] PT Time Calculation (min) (ACUTE ONLY): 20 min   Charges:   PT Evaluation $PT Eval Low Complexity: [place], PT  Acute Rehab Dept ([place]) [phone] Pager [phone]  09/14/2020   [facility] 09/14/2020, 4:14 PM

## 2020-09-15 DIAGNOSIS — R1031 Right lower quadrant pain: Secondary | ICD-10-CM

## 2020-09-15 DIAGNOSIS — N179 Acute kidney failure, unspecified: Secondary | ICD-10-CM | POA: Diagnosis not present

## 2020-09-15 DIAGNOSIS — D649 Anemia, unspecified: Secondary | ICD-10-CM | POA: Diagnosis not present

## 2020-09-15 DIAGNOSIS — E1122 Type 2 diabetes mellitus with diabetic chronic kidney disease: Secondary | ICD-10-CM | POA: Diagnosis not present

## 2020-09-15 DIAGNOSIS — N185 Chronic kidney disease, stage 5: Secondary | ICD-10-CM | POA: Diagnosis not present

## 2020-09-15 LAB — BPAM RBC
Blood Product Expiration Date: 202205172359
ISSUE DATE / TIME: 202205011531
Unit Type and Rh: 6200

## 2020-09-15 LAB — GLUCOSE, CAPILLARY
Glucose-Capillary: 79 mg/dL (ref 70–99)
Glucose-Capillary: 164 mg/dL — ABNORMAL HIGH (ref 70–99)
Glucose-Capillary: 150 mg/dL — ABNORMAL HIGH (ref 70–99)
Glucose-Capillary: 176 mg/dL — ABNORMAL HIGH (ref 70–99)

## 2020-09-15 LAB — CBC
HCT: 26.1 % — ABNORMAL LOW (ref 36.0–46.0)
Hemoglobin: 8.6 g/dL — ABNORMAL LOW (ref 12.0–15.0)
MCH: 28.2 pg (ref 26.0–34.0)
MCHC: 33 g/dL (ref 30.0–36.0)
MCV: 85.6 fL (ref 80.0–100.0)
Platelets: 196 10*3/uL (ref 150–400)
RBC: 3.05 MIL/uL — ABNORMAL LOW (ref 3.87–5.11)
RDW: 14.3 % (ref 11.5–15.5)
WBC: 6.8 10*3/uL (ref 4.0–10.5)
nRBC: 0 % (ref 0.0–0.2)

## 2020-09-15 LAB — RENAL FUNCTION PANEL
Albumin: 2.6 g/dL — ABNORMAL LOW (ref 3.5–5.0)
Anion gap: 8 (ref 5–15)
BUN: 42 mg/dL — ABNORMAL HIGH (ref 6–20)
CO2: 20 mmol/L — ABNORMAL LOW (ref 22–32)
Calcium: 9 mg/dL (ref 8.9–10.3)
Chloride: 115 mmol/L — ABNORMAL HIGH (ref 98–111)
Creatinine, Ser: 4.88 mg/dL — ABNORMAL HIGH (ref 0.44–1.00)
GFR, Estimated: 10 mL/min — ABNORMAL LOW (ref >60)
Glucose, Bld: 84 mg/dL (ref 70–99)
Phosphorus: 4.9 mg/dL — ABNORMAL HIGH (ref 2.5–4.6)
Potassium: 4 mmol/L (ref 3.5–5.1)
Sodium: 143 mmol/L (ref 135–145)

## 2020-09-15 LAB — TYPE AND SCREEN
ABO/RH(D): A POS
Antibody Screen: NEGATIVE
Unit division: 0

## 2020-09-15 LAB — PROTEIN / CREATININE RATIO, URINE
Creatinine, Urine: 69.31 mg/dL
Protein Creatinine Ratio: 4.57 mg/mg{Cre} — ABNORMAL HIGH (ref 0.00–0.15)
Total Protein, Urine: 317 mg/dL

## 2020-09-15 LAB — MAGNESIUM: Magnesium: 1.9 mg/dL (ref 1.7–2.4)

## 2020-09-15 MED ORDER — HYDROXYZINE HCL 10 MG PO TABS
10.0000 mg | ORAL_TABLET | Freq: Three times a day (TID) | ORAL | Status: DC | PRN
  Administered 2020-09-15 – 2020-09-17 (×3): 10 mg via ORAL
  Filled 2020-09-15 (×4): qty 1

## 2020-09-15 MED ORDER — HYDRALAZINE HCL 50 MG PO TABS
50.0000 mg | ORAL_TABLET | Freq: Three times a day (TID) | ORAL | Status: DC
  Administered 2020-09-15 – 2020-09-17 (×6): 50 mg via ORAL
  Filled 2020-09-15 (×6): qty 1

## 2020-09-15 MED ORDER — BOOST / RESOURCE BREEZE PO LIQD CUSTOM
1.0000 | Freq: Three times a day (TID) | ORAL | Status: DC
  Administered 2020-09-15 – 2020-09-17 (×5): 1 via ORAL

## 2020-09-15 MED ORDER — SENNOSIDES-DOCUSATE SODIUM 8.6-50 MG PO TABS: 1.0000 | ORAL_TABLET | Freq: Two times a day (BID) | ORAL | Status: DC | PRN

## 2020-09-15 MED ORDER — POLYETHYLENE GLYCOL 3350 17 G PO PACK: 17.0000 g | PACK | Freq: Two times a day (BID) | ORAL | Status: DC | PRN

## 2020-09-15 MED ORDER — DIPHENHYDRAMINE HCL 25 MG PO CAPS
25.0000 mg | ORAL_CAPSULE | Freq: Three times a day (TID) | ORAL | Status: DC | PRN
  Administered 2020-09-15: 25 mg via ORAL
  Filled 2020-09-15: qty 1

## 2020-09-15 NOTE — Progress Notes (Signed)
PROGRESS NOTE  Hannah [name] [id number] DOB: [birth year]   PCP: Vernie Shanks, MD  Patient is from: Home.  Lives with her daughter.  Uses walker and wheelchair at baseline.  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  Patient has had decreased p.o. intake and nausea for days prior to presenting to PCP.  She is also on ARB/HCTZ for blood pressure.  On admission, Cr up to 6.3 from baseline of 4.0.  Started on IV fluid.  Renal ultrasound without acute finding.  Creatinine improving.  Subjective: Seen and examined earlier this morning.  No major events overnight of this morning.  She reports abdominal pain over right lower quadrant.  Pain is worse with movement.  She rates her pain 7/10.  She denies nausea or vomiting.  Reports normal bowel movement yesterday.  She denies UTI symptoms.  No fever or chills.  Objective: Vitals:   09/14/20 2040 09/14/20 2045 09/15/20 0017 09/15/20 0454  BP: (!) 151/112 (!) 157/73 (!) 159/77 (!) 155/83  Pulse: 99  96 94  Resp: '16  18 18  '$ Temp: 98.3 F (36.8 C)  98.8 F (37.1 C) 99 F (37.2 C)  TempSrc: Oral  Oral Oral  SpO2: 99%  97% 98%    Intake/Output Summary (Last 24 hours) at 09/15/2020 1259 Last data filed at 09/15/2020 0900 Gross per 24 hour  Intake 2135.29 ml  Output 700 ml  Net 1435.29 ml   There were no vitals filed for this visit.  Examination:  GENERAL: No apparent distress.  Nontoxic. HEENT: MMM.  Vision and hearing grossly intact.  NECK: Supple.  No apparent JVD.  RESP: On RA.  No IWOB.  Fair aeration bilaterally. CVS:  RRR. Heart sounds normal.  ABD/GI/GU: BS+. Abd soft, NTND.  No palpable mass.  No rebound or guarding. MSK/EXT:  Moves extremities. No apparent deformity. No edema.  SKIN: no  apparent skin lesion or wound NEURO: Awake, alert and oriented appropriately.  Residual aphasia and RUE weakness. PSYCH: Calm. Normal affect.   Procedures:  None  Microbiology summarized: [id number] and influenza PCR nonreactive.  Assessment & Plan: AKI on CKD-5 with azotemia: Suspect prerenal etiology from poor p.o. intake and nausea.  Also on HCTZ and losartan which could contribute.  She denies NSAID use.  UA with > 300 protein.  Renal US negative.  Uric acid 6.8.  Seen by nephrologist over a year ago and did not follow-up. Recent Labs    06/11/20 1205 06/12/20 0204 06/12/20 1605 06/13/20 0809 06/17/20 0454 06/23/20 0646 09/12/20 1859 09/12/20 2025 09/13/20 0438 09/14/20 0546 09/15/20 0517  BUN 30* 31*  --  29* 36* 27* 53* 47* 49* 46* 42*  CREATININE 3.70* 3.67* 3.65* 3.98* 4.42* 3.92* 5.85* 6.30* 5.60* 5.30* 4.88*  -Continue IV fluid -Follow UPC-this was not sent again.  Requested RN to send urine sample -Nephrology consulted on admission and suggested reconsult if worse. -Needs to [facility] with her nephrologist on discharge.   Anemia of renal disease: some drop in Hgb likely dilutional.  Denies melena or hematochezia.  Received 1 unit on 5/1 with appropriate response.  Anemia panel consistent with anemia of chronic disease Recent Labs    06/12/20 1605 06/13/20 0809 06/14/20 0553 06/19/20 1013 09/12/20 1859 09/12/20 2025 09/13/20 0438 09/14/20 0546 09/14/20 2010 09/15/20  0517  HGB 9.6* 7.8* 7.7* 8.8* 10.3* 10.5* 8.2* 7.4* 8.9* 8.6*  -Continue monitoring  History of CVA with residual aphasia and RUE weakness: Stable -Continue Statin and Plavix -Assess mobility per protocol  Uncontrolled hypertension: SBP in 150s. -Continue home amlodipine -Changed metoprolol to Coreg for better blood pressure control on 4/30 -Increased hydralazine to 50 mg 3 times daily -Continue as needed hydralazine as well -Discontinue HCTZ given her renal function -Continue  holding losartan  Uncontrolled IDDM-2 with hyperglycemia, CKD-5 and macrovascular complication: [id number]. Recent Labs  Lab 09/14/20 1216 09/14/20 1753 09/14/20 2100 09/15/20 0752 09/15/20 1125  GLUCAP 146* 112* 152* 79 164*  -Continue SSI-sensitive -Continue Lantus 10 units at night -Continue home statin  Hypokalemia: Resolved.  Hyperphosphatemia: P 4.9.  Improved. -Recheck in the morning  Hyponatremia: Resolved.  Debility: Walker and wheelchair dependent at baseline. -PT/OT  Abdominal pain: Reports RLQ pain with movement.  No other GI or UTI symptoms.  Abdominal exam benign.  KUB without significant finding other than a stool in ascending colon. -MiraLAX and Senokot-S as needed for constipation. -Continue monitoring  Inadequate oral intake There is no height or weight on file to calculate BMI. Nutrition Problem: Inadequate oral intake Etiology: poor appetite Signs/Symptoms: per patient/family report Interventions: Boost Breeze,Snacks   DVT prophylaxis:  SCDs Start: 09/12/20 2153  Code Status: Full code Family Communication: Updated patient's daughter over the phone on 4/30 Level of care: Med-Surg Status is: Inpatient  Remains inpatient appropriate because:IV treatments appropriate due to intensity of illness or inability to take PO and Inpatient level of care appropriate due to severity of illness   Dispo: The patient is from: Home              Anticipated d/c is to: Home              Patient currently is not medically stable to d/c.   Difficult to place patient No             Consultants:  Nephrology on admission   Sch Meds:  Scheduled Meds: . amLODipine  10 mg Oral Daily  . carvedilol  25 mg Oral BID WC  . clopidogrel  75 mg Oral Daily  . famotidine  20 mg Oral Daily  . feeding supplement  1 Container Oral TID BM  . FLUoxetine  20 mg Oral Daily  . hydrALAZINE  50 mg Oral Q8H  . insulin aspart  0-9 Units Subcutaneous TID WC  . insulin  glargine  10 Units Subcutaneous QHS  . melatonin  3 mg Oral QHS  . rosuvastatin  10 mg Oral Daily   Continuous Infusions: . sodium chloride 75 mL/hr at 09/15/20 1033   PRN Meds:.acetaminophen **OR** acetaminophen, albuterol, hydrALAZINE, ondansetron **OR** ondansetron (ZOFRAN) IV  Antimicrobials: Anti-infectives (From admission, onward)   None       I have personally reviewed the following labs and images: CBC: Recent Labs  Lab 09/12/20 1859 09/12/20 2025 09/13/20 0438 09/14/20 0546 09/14/20 2010 09/15/20 0517  WBC 9.1  --  7.7 6.3  --  6.8  NEUTROABS 6.2  --   --   --   --   --   HGB 10.3* 10.5* 8.2* 7.4* 8.9* 8.6*  HCT 31.3* 31.0* 24.7* 23.0* 27.3* 26.1*  MCV 85.3  --  85.2 87.8  --  85.6  PLT 271  --  221 205  --  196   BMP &GFR Recent Labs  Lab 09/12/20 1859 09/12/20 2025 09/13/20 0438 09/14/20  0546 09/15/20 0517  NA 134* 137 137 140 143  K 3.5 3.6 3.3* 3.8 4.0  CL 97* 98 106 113* 115*  CO2 25  --  23 21* 20*  GLUCOSE 184* 182* 137* 144* 84  BUN 53* 47* 49* 46* 42*  CREATININE 5.85* 6.30* 5.60* 5.30* 4.88*  CALCIUM 9.5  --  8.7* 8.5* 9.0  MG 2.4  --  2.1 2.0 1.9  PHOS 5.7*  --  5.2* 4.9* 4.9*   CrCl cannot be calculated (Unknown ideal weight.). Liver & Pancreas: Recent Labs  Lab 09/12/20 1859 09/13/20 0438 09/14/20 0546 09/15/20 0517  AST 14* 11*  --   --   ALT 10 8  --   --   ALKPHOS 75 59  --   --   BILITOT 0.5 0.3  --   --   PROT 8.7* 6.7  --   --   ALBUMIN 3.8 2.9* 2.5* 2.6*   No results for input(s): LIPASE, AMYLASE in the last 168 hours. No results for input(s): AMMONIA in the last 168 hours. Diabetic: Recent Labs    09/13/20 0438  HGBA1C 8.7*   Recent Labs  Lab 09/14/20 1216 09/14/20 1753 09/14/20 2100 09/15/20 0752 09/15/20 1125  GLUCAP 146* 112* 152* 79 164*   Cardiac Enzymes: Recent Labs  Lab 09/14/20 0546  CKTOTAL 34*   No results for input(s): PROBNP in the last 8760 hours. Coagulation Profile: No results for  input(s): INR, PROTIME in the last 168 hours. Thyroid Function Tests: No results for input(s): TSH, T4TOTAL, FREET4, T3FREE, THYROIDAB in the last 72 hours. Lipid Profile: No results for input(s): CHOL, HDL, LDLCALC, TRIG, CHOLHDL, LDLDIRECT in the last 72 hours. Anemia Panel: Recent Labs    09/14/20 1052  VITAMINB12 264  FOLATE 8.4  FERRITIN 25  TIBC 220*  IRON 44  RETICCTPCT 1.2   Urine analysis:    Component Value Date/Time   COLORURINE STRAW (A) 09/12/2020 2208   APPEARANCEUR CLEAR 09/12/2020 2208   LABSPEC 1.007 09/12/2020 2208   PHURINE 6.0 09/12/2020 2208   GLUCOSEU >=500 (A) 09/12/2020 2208   HGBUR NEGATIVE 09/12/2020 2208   BILIRUBINUR NEGATIVE 09/12/2020 2208   KETONESUR NEGATIVE 09/12/2020 2208   PROTEINUR >=300 (A) 09/12/2020 2208   NITRITE NEGATIVE 09/12/2020 2208   LEUKOCYTESUR NEGATIVE 09/12/2020 2208   Sepsis Labs: Invalid input(s): PROCALCITONIN, [place]  Microbiology: Recent Results (from the past 240 hour(s))  Resp Panel by RT-PCR (Flu A&B, Covid) Nasopharyngeal Swab     Status: None   Collection Time: 09/12/20  8:01 PM   Specimen: Nasopharyngeal Swab; Nasopharyngeal(NP) swabs in vial transport medium  Result Value Ref Range Status   SARS Coronavirus 2 by RT PCR NEGATIVE NEGATIVE Final    Comment: (NOTE) SARS-CoV-2 target nucleic acids are NOT DETECTED.  The SARS-CoV-2 RNA is generally detectable in upper respiratory specimens during the acute phase of infection. The lowest concentration of SARS-CoV-2 viral copies this assay can detect is 138 copies/mL. A negative result does not preclude SARS-Cov-2 infection and should not be used as the sole basis for treatment or other patient management decisions. A negative result may occur with  improper specimen collection/handling, submission of specimen other than nasopharyngeal swab, presence of viral mutation(s) within the areas targeted by this assay, and inadequate number of viral copies(<138  copies/mL). A negative result must be combined with clinical observations, patient history, and epidemiological information. The expected result is Negative.  Fact Sheet for Patients:  EntrepreneurPulse.com.au  Fact Sheet for Healthcare  Providers:  IncredibleEmployment.be  This test is no t yet approved or cleared by the [place] and  has been authorized for detection and/or diagnosis of SARS-CoV-2 by FDA under an Emergency Use Authorization (EUA). This EUA will remain  in effect (meaning this test can be used) for the duration of the COVID-19 declaration under Section 564(b)(1) of the Act, 21 U.S.C.section 360bbb-3(b)(1), unless the authorization is terminated  or revoked sooner.       Influenza A by PCR NEGATIVE NEGATIVE Final   Influenza B by PCR NEGATIVE NEGATIVE Final    Comment: (NOTE) The Xpert Xpress SARS-CoV-2/FLU/RSV plus assay is intended as an aid in the diagnosis of influenza from Nasopharyngeal swab specimens and should not be used as a sole basis for treatment. Nasal washings and aspirates are unacceptable for Xpert Xpress SARS-CoV-2/FLU/RSV testing.  Fact Sheet for Patients: EntrepreneurPulse.com.au  Fact Sheet for Healthcare Providers: IncredibleEmployment.be  This test is not yet approved or cleared by the [place] FDA and has been authorized for detection and/or diagnosis of SARS-CoV-2 by FDA under an Emergency Use Authorization (EUA). This EUA will remain in effect (meaning this test can be used) for the duration of the COVID-19 declaration under Section 564(b)(1) of the Act, 21 U.S.C. section 360bbb-3(b)(1), unless the authorization is terminated or revoked.  Performed at [facility], [place] [address], [place], [place] [address]     Radiology Studies: DG Abd 1 View  Result Date: 09/15/2020 CLINICAL DATA:  Right lower quadrant abdominal pain.  EXAM: ABDOMEN - 1 VIEW COMPARISON:  None. FINDINGS: The bowel gas pattern is normal. A moderate amount of stool is seen within the ascending colon. No radio-opaque calculi or other significant radiographic abnormality are seen. IMPRESSION: Negative. Electronically Signed   By: Virgina Norfolk M.D.   On: 09/15/2020 04:03      Severn Goddard T. [place]  If 7PM-7AM, please contact night-coverage [email or web address] 09/15/2020, 12:59 PM

## 2020-09-15 NOTE — Plan of Care (Signed)

## 2020-09-15 NOTE — Evaluation (Signed)
Occupational Therapy Evaluation Patient Details Name: Hannah [name] MRN: [id number] DOB: November 21, 1965 Today's Date: 09/15/2020    History of Present Illness Hannah [name] is a 55 y.o. female with medical history significant of CVA with resultant L hemiparesis; MS; HTN; HLD; CKD and DM presenting with R sided weakness, vision changes, and dyphasia.   Clinical Impression   Patient is currently requiring assistance with ADLs including total assist with toileting (baseline), Minimal assist with LE dressing, moderate assist with bathing, and minimal to moderate assist with UE dressing, all of which is close to patient's typical baseline at home where she receives 24/7 assistance from her daughter, however pt and family may benefit from further therapy for instruction on AE/DME options to maximize pt's independence and safety at home.  During this evaluation, patient was limited by feeling "weak" with Hgb of 8.6, as well as baseline RT sided weakness, impaired activity tolerance esp with ambulation, and anxiety/phobia surrounding viewing of sink or trash cans in room.  Peters "6-clicks" Daily Activity Inpatient Short Form score of 14/24 indicates 59.67% ADL impairment this session.  Patient demonstrates good rehab potential, and should benefit from continued skilled occupational therapy services while in acute care to maximize safety, independence and quality of life at home.    ?    Follow Up Recommendations  No OT follow up (Pt reports that she was about to begin outpatient physical therapy on the day prior to admission. Will defer to outpatient plans per pt and her MD.)    Equipment Recommendations    TBD   Recommendations for Other Services       Precautions / Restrictions Precautions Precautions: Fall Precaution Comments: Baseline RT sided weakness Restrictions Weight Bearing Restrictions: No      Mobility Bed Mobility Overal bed mobility: Needs Assistance Bed Mobility:  Supine to Sit     Supine to sit: Min guard     General bed mobility comments: light assist to facilitate trunk elevation, and for safety/balance while scooting to EOB as pt with 1 slight posterior LOB but able to correct with Min guard.    Transfers Overall transfer level: Needs assistance Equipment used: Rolling walker (2 wheeled) Transfers: Sit to/from [facility] Sit to Stand: Min assist;From elevated surface Stand pivot transfers: Min assist       General transfer comment: incr time to come to full stand. light assist to rise and transition to RW, steadying assist during ambulation around bed ~15' to recliner and cues for UE reach back for controlled descent with need of Min assist for safety.    Balance Overall balance assessment: Needs assistance Sitting-balance support: Feet unsupported;Single extremity supported Sitting balance-Leahy Scale: Fair     Standing balance support: Bilateral upper extremity supported Standing balance-Leahy Scale: Poor Standing balance comment: reliant on UEs, stooped and quick to fatigue.                           ADL either performed or assessed with clinical judgement   ADL Overall ADL's : Needs assistance/impaired Eating/Feeding: Set up;Sitting   Grooming: Set up;Sitting;Min guard   Upper Body Bathing: Min guard;Sitting   Lower Body Bathing: Sitting/lateral leans;Sit to/from stand;Minimal assistance   Upper Body Dressing : Minimal assistance;Sitting   Lower Body Dressing: Minimal assistance Lower Body Dressing Details (indicate cue type and reason): Pt able to sit EOB and don socks with increased time/effort and setup/supervision.  Standing LE dressing Min As for safety.  Toilet Transfer: RW;Ambulation;Minimal assistance   Toileting- Water quality scientist and Hygiene: Maximal assistance Toileting - Clothing Manipulation Details (indicate cue type and reason): Pure wick. Baseline bladder incontinence and  assisted by daughter with hygiene post bowel movement. Family may benefit from education on adaptive equipment options such as bidet. Tub/ Shower Transfer: Tub transfer;Tub bench;Moderate assistance Tub/Shower Transfer Details (indicate cue type and reason): Based on general assessment. Functional mobility during ADLs: Min guard;Minimal assistance;Rolling walker       Vision Baseline Vision/History: Wears glasses Wears Glasses: Reading only Patient Visual Report: No change from baseline Additional Comments: Pt reports vision imapirment at baseline "from diabetes" but denies h/o glaucoma. Pt denies any acute changes to vision. Pt reports that her glasses are not strong enough.     Perception     Praxis      Pertinent Vitals/Pain Pain Assessment: 0-10 Pain Score: 7  Pain Location: Lower RT abdomin. Pt stated it feels like a muscle pull. Denies any falls in past year. Pain Descriptors / Indicators: Tightness Pain Intervention(s): Limited activity within patient's tolerance;Monitored during session;Repositioned     Hand Dominance Right (Now uses LT as dominant due to previous CVA)   Extremity/Trunk Assessment Upper Extremity Assessment Upper Extremity Assessment: RUE deficits/detail RUE Deficits / Details: Baseline deficits. Very limited shoulder AROM to ~70 degrees and PROM to 90 with taffy end feel. Elbow to hand WFL, but weak, grossly 3+/5 except bicep 4-/5. RUE Sensation: decreased light touch (Pt denied any difference between RT and LT hand light touch, but stated that numbness to RT hand can be intermittent.) RUE Coordination: decreased fine motor   Lower Extremity Assessment RLE Coordination: decreased gross motor   Cervical / Trunk Assessment Cervical / Trunk Assessment: Kyphotic   Communication Communication Communication:  (Mild dysarthria-baseline. Speech is slow but clear)   Cognition Arousal/Alertness: Awake/alert Behavior During Therapy: WFL for tasks  assessed/performed Overall Cognitive Status: Within Functional Limits for tasks assessed                                 General Comments: Pt reports certain phobias. Does not want sinks in view. Does not want trash can in view if trash is inside it.  Pt's chair and trash can reporsitioned to be out of pt's line of site.   General Comments       Exercises     Shoulder Instructions      Home Living Family/patient expects to be discharged to:: Private residence Living Arrangements: Children Available Help at Discharge: Family;Available 24 hours/day;Other (Comment) (Pt lives with 2 daughters and 1 sin in Sports coach. One daughter is available 24/7) Type of Home: Apartment Home Access: Level entry     Home Layout: One level     Bathroom Shower/Tub: Teacher, early years/pre: Standard Bathroom Accessibility: Yes How Accessible: Accessible via walker;Accessible via wheelchair Home Equipment: Gilford Rile - 4 wheels;Cane - single point;Wheelchair - Education officer, community - power;Tub bench   Additional Comments: pt reports she has a loaner power chair currently that is a little too large for her, she is waiting on her permanent power chair.  Pt reports that she owns a 2 wheeled RW but it is "wobbly" and needs a new one. Pt does not trust her Rollator as it rolls too quickly.      Prior Functioning/Environment Level of Independence: Needs assistance  Gait / Transfers Assistance Needed: amb with RW short distances in apt and  stated that her daughter is always holding her with gait belt, also uses power chair vs manual chair inside and for longer distances ADL's / Homemaking Assistance Needed: family assist with self care: Dtr assists with tub transfers using tub transfer bench, assists with hygiene after bowel movement and pt wear pull ups for bladder void due to bladder incontinence. Dtr assists with fine motor needs such as fasteners on clothing or cutting food.            OT  Problem List: Decreased strength;Pain;Impaired sensation;Decreased activity tolerance;Decreased knowledge of use of DME or AE;Impaired balance (sitting and/or standing);Impaired vision/perception;Impaired UE functional use      OT Treatment/Interventions: Self-care/ADL training;Therapeutic activities;Therapeutic exercise;Energy conservation;Patient/family education;Neuromuscular education;DME and/or AE instruction;Balance training;Manual therapy    OT Goals(Current goals can be found in the care plan section) Acute Rehab OT Goals Patient Stated Goal: Get energy back OT Goal Formulation: With patient Time For Goal Achievement: 09/29/20 Potential to Achieve Goals: Good ADL Goals Pt Will Perform Lower Body Bathing: with supervision;sitting/lateral leans Pt Will Perform Lower Body Dressing: with modified independence;sit to/from stand Pt Will Transfer to Toilet: with modified independence;ambulating Additional ADL Goal #1: Pt/family will verbalize understanding to adaptive equipment options to decrease caregiver burden with peri hygiene at home. Additional ADL Goal #2: Patient will identify at least 3 energy conservation strategies to employ at home in order to maximize function and quality of life and decrease caregiver burden while preventing exacerbation of symptoms and rehospitalization.  OT Frequency: Min 2X/week   Barriers to D/C:    None known       Co-evaluation              AM-PAC OT "6 Clicks" Daily Activity     Outcome Measure Help from another person eating meals?: A Little Help from another person taking care of personal grooming?: A Little Help from another person toileting, which includes using toliet, bedpan, or urinal?: Total Help from another person bathing (including washing, rinsing, drying)?: A Lot Help from another person to put on and taking off regular upper body clothing?: A Lot Help from another person to put on and taking off regular lower body clothing?: A  Little 6 Click Score: 14   End of Session Equipment Utilized During Treatment: Gait belt;Rolling walker Nurse Communication: Mobility status  Activity Tolerance: Patient tolerated treatment well Patient left: in chair;with call bell/phone within reach;with chair alarm set;with nursing/sitter in room  OT Visit Diagnosis: Unsteadiness on feet (R26.81);Hemiplegia and hemiparesis;Pain;Muscle weakness (generalized) (M62.81) Hemiplegia - Right/Left: Right Hemiplegia - dominant/non-dominant: Dominant Hemiplegia - caused by: Cerebral infarction (Previous) Pain - Right/Left: Right Pain - part of body:  (ABD)                Time: [id number] OT Time Calculation (min): 36 min Charges:  OT General Charges $OT Visit: 1 Visit OT Evaluation $OT Eval Low Complexity: 1 Low OT Treatments $Therapeutic Activity: 8-22 mins  Anderson Malta, OT Acute Rehab Services Office: [phone] 09/15/2020  Julien Girt 09/15/2020, 2:02 PM

## 2020-09-15 NOTE — Progress Notes (Signed)
Initial Nutrition Assessment  INTERVENTION:   -Boost Breeze po TID, each supplement provides 250 kcal and 9 grams of protein  -Daily snack  -Needs weight obtained in order to assess weight trends  NUTRITION DIAGNOSIS:   Inadequate oral intake related to poor appetite as evidenced by per patient/family report.  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  Patient has had decreased p.o. intake and nausea for days prior to presenting to PCP.  Patient with slight slurred speech but understandable. Pt reports she has not been eating well but states she doesn't have a good reason as to why she isn't. States foods cooked for her do not appeal to her tastes. Likes fresh foods and vegetables. Does not like a lot of salt in her foods. Reports taste changes following last stroke. Willing to try [employer] and would like yogurt as a snack.   Per weight records, pt has not lost any weight. Per pt she believes she has lsot 10-20 lbs but states she is unsure of time frame. Per records, may have had weight loss but no weights were measured except for maybe the weight from 1/26. Pt states she was not weighed this admission.  Medications reviewed.  Labs reviewed: CBGs: 79-164 Elevated Phos (4.9)  NUTRITION - FOCUSED PHYSICAL EXAM:  Pt with left hemiparesis. No depletions noted.  Diet Order:   Diet Order            Diet Carb Modified Fluid consistency: Thin; Room service appropriate? Yes  Diet effective now                 EDUCATION NEEDS:   Education needs have been addressed  Skin:  Skin Assessment: Reviewed RN Assessment  Last BM:   4/30 -type 1  Height:   Ht Readings from Last 1 Encounters:  06/12/20 '5\' 2"'$  (1.575 m)    Weight:   Wt Readings from Last 1 Encounters:  06/23/20 66.5 kg    BMI:  There is no height or weight on file to calculate BMI.  Estimated Nutritional Needs:   Kcal:  1300-1500  Protein:  60-75g  Fluid:  1.5L/day  Clayton Bibles, MS, RD, LDN Inpatient Clinical Dietitian Contact information available via Amion

## 2020-09-16 DIAGNOSIS — N179 Acute kidney failure, unspecified: Secondary | ICD-10-CM | POA: Diagnosis not present

## 2020-09-16 DIAGNOSIS — N185 Chronic kidney disease, stage 5: Secondary | ICD-10-CM | POA: Diagnosis not present

## 2020-09-16 DIAGNOSIS — E1122 Type 2 diabetes mellitus with diabetic chronic kidney disease: Secondary | ICD-10-CM | POA: Diagnosis not present

## 2020-09-16 DIAGNOSIS — D649 Anemia, unspecified: Secondary | ICD-10-CM | POA: Diagnosis not present

## 2020-09-16 LAB — RENAL FUNCTION PANEL
Albumin: 2.8 g/dL — ABNORMAL LOW (ref 3.5–5.0)
Anion gap: 8 (ref 5–15)
BUN: 40 mg/dL — ABNORMAL HIGH (ref 6–20)
CO2: 18 mmol/L — ABNORMAL LOW (ref 22–32)
Calcium: 8.9 mg/dL (ref 8.9–10.3)
Chloride: 112 mmol/L — ABNORMAL HIGH (ref 98–111)
Creatinine, Ser: 4.71 mg/dL — ABNORMAL HIGH (ref 0.44–1.00)
GFR, Estimated: 10 mL/min — ABNORMAL LOW (ref >60)
Glucose, Bld: 111 mg/dL — ABNORMAL HIGH (ref 70–99)
Phosphorus: 4.7 mg/dL — ABNORMAL HIGH (ref 2.5–4.6)
Potassium: 3.9 mmol/L (ref 3.5–5.1)
Sodium: 138 mmol/L (ref 135–145)

## 2020-09-16 LAB — CBC
HCT: 27.3 % — ABNORMAL LOW (ref 36.0–46.0)
Hemoglobin: 8.6 g/dL — ABNORMAL LOW (ref 12.0–15.0)
MCH: 27.5 pg (ref 26.0–34.0)
MCHC: 31.5 g/dL (ref 30.0–36.0)
MCV: 87.2 fL (ref 80.0–100.0)
Platelets: 183 10*3/uL (ref 150–400)
RBC: 3.13 MIL/uL — ABNORMAL LOW (ref 3.87–5.11)
RDW: 14 % (ref 11.5–15.5)
WBC: 6.8 10*3/uL (ref 4.0–10.5)
nRBC: 0 % (ref 0.0–0.2)

## 2020-09-16 LAB — GLUCOSE, CAPILLARY
Glucose-Capillary: 95 mg/dL (ref 70–99)
Glucose-Capillary: 223 mg/dL — ABNORMAL HIGH (ref 70–99)
Glucose-Capillary: 192 mg/dL — ABNORMAL HIGH (ref 70–99)
Glucose-Capillary: 129 mg/dL — ABNORMAL HIGH (ref 70–99)

## 2020-09-16 LAB — MAGNESIUM: Magnesium: 1.7 mg/dL (ref 1.7–2.4)

## 2020-09-16 MED ORDER — SODIUM BICARBONATE 650 MG PO TABS
650.0000 mg | ORAL_TABLET | Freq: Three times a day (TID) | ORAL | Status: DC
  Administered 2020-09-16 – 2020-09-17 (×3): 650 mg via ORAL
  Filled 2020-09-16 (×3): qty 1

## 2020-09-16 NOTE — Progress Notes (Signed)
Physical Therapy Treatment Patient Details Name: Hannah [name] MRN: [id number] DOB: 1965/06/04 Today's Date: 09/16/2020    History of Present Illness Hannah [name] is a 55 y.o. female with medical history significant of CVA with resultant L hemiparesis; MS; HTN; HLD; CKD and DM presenting with R sided weakness, vision changes, and dyphasia.    PT Comments    Assisted OOB to amb a limited distance.  General transfer comment: pt was able to self rise from elevated bed with good use of hands to steady self.  R UE weaker L LE but good grip B hands.  Did require assist on R to advance walker esp with turns. General Gait Details: pt did amb within room 8 feet with assist to advance walker esp with turns.  Decreased weight shift to R LE and toe drag.  Pt uses her power chair for most of her mobility. Pt plans to return home with full family support.   Follow Up Recommendations  No PT follow up     Equipment Recommendations  None recommended by PT    Recommendations for Other Services       Precautions / Restrictions Precautions Precautions: Fall Precaution Comments: Baseline RT sided weakness Restrictions Weight Bearing Restrictions: No    Mobility  Bed Mobility Overal bed mobility: Needs Assistance Bed Mobility: Supine to Sit     Supine to sit: Supervision;Min guard     General bed mobility comments: HOB elevated and use of rail.  Required increased time and use of pad to complete scooting to EOB.    Transfers Overall transfer level: Needs assistance Equipment used: Rolling walker (2 wheeled) Transfers: Sit to/from [facility] Sit to Stand: Min assist;From elevated surface Stand pivot transfers: Min assist       General transfer comment: pt was able to self rise from elevated bed with good use of hands to steady self.  R UE weaker L LE but good grip B hands.  Did require assist on R to advance walker esp with turns.  Ambulation/Gait Ambulation/Gait  assistance: Supervision;Min guard   Assistive device: Rolling walker (2 wheeled) Gait Pattern/deviations: Step-to pattern;Decreased stance time - right Gait velocity: decreased   General Gait Details: pt did amb within room 8 feet with assist to advance walker esp with turns.  Decreased weight shift to R LE and toe drag.  Pt uses her power chair for most of her mobility.   Stairs             Wheelchair Mobility    Modified Rankin (Stroke Patients Only)       Balance                                            Cognition Arousal/Alertness: Awake/alert Behavior During Therapy: WFL for tasks assessed/performed Overall Cognitive Status: Within Functional Limits for tasks assessed                                 General Comments: AxO x 3 very pleasant and motivated      Exercises      General Comments        Pertinent Vitals/Pain Pain Assessment: No/denies pain    Home Living  Prior Function            PT Goals (current goals can now be found in the care plan section) Progress towards PT goals: Progressing toward goals    Frequency    Min 3X/week      PT Plan Current plan remains appropriate    Co-evaluation              AM-PAC PT "6 Clicks" Mobility   Outcome Measure  Help needed turning from your back to your side while in a flat bed without using bedrails?: A Little Help needed moving from lying on your back to sitting on the side of a flat bed without using bedrails?: A Little Help needed moving to and from a bed to a chair (including a wheelchair)?: A Little Help needed standing up from a chair using your arms (e.g., wheelchair or bedside chair)?: A Little Help needed to walk in hospital room?: A Lot Help needed climbing 3-5 steps with a railing? : A Lot 6 Click Score: 16    End of Session Equipment Utilized During Treatment: Gait belt Activity Tolerance: Patient tolerated  treatment well Patient left: in chair;with call bell/phone within reach;with chair alarm set;with family/visitor present;with nursing/sitter in room Nurse Communication: Mobility status PT Visit Diagnosis: Other abnormalities of gait and mobility (R26.89)     Time: [id number] PT Time Calculation (min) (ACUTE ONLY): 18 min  Charges:  $Gait Training: 8-22 mins                     Rica Koyanagi  PTA Acute  Rehabilitation Services Pager      [phone] Office      [phone]

## 2020-09-16 NOTE — Progress Notes (Signed)
PROGRESS NOTE  Hannah [name] [id number] DOB: [birth year]   PCP: Vernie Shanks, MD  Patient is from: Home.  Lives with her daughter.  Uses walker and wheelchair at baseline.  DOA: 09/12/2020 LOS: 2  Chief complaints: Elevated renal indicis  Brief Narrative / Interim history: 55 year old F with PMH of CKD-5, IDDM-2, CVA with residual right hemiparesis and aphasia, HTN, hyperlipidemia and debility directed to ED by PCP due to elevated creatinine.  Patient has had decreased p.o. intake and nausea for days prior to presenting to PCP.  She is also on ARB/HCTZ for blood pressure.  On admission, Cr up to 6.3 from baseline of 4.0.  Started on IV fluid.  Renal ultrasound without acute finding.  Creatinine improving.  Subjective: Seen and examined earlier this morning.  No major events overnight or this morning.  She had itching all over does seem to have improved with Atarax.  No skin rash.  Denies chest pain, shortness of breath, GI or UTI symptoms.  Objective: Vitals:   09/16/20 0610 09/16/20 0756 09/16/20 1000 09/16/20 1443  BP: (!) 152/78 (!) 155/79 (!) 141/69 (!) 145/72  Pulse:  95    Resp:      Temp:      TempSrc:      SpO2:      Weight:        Intake/Output Summary (Last 24 hours) at 09/16/2020 1452 Last data filed at 09/16/2020 0601 Gross per 24 hour  Intake --  Output 800 ml  Net -800 ml   Filed Weights   09/15/20 1119 09/16/20 0553  Weight: 65.8 kg 67.1 kg    Examination:  GENERAL: No apparent distress.  Nontoxic. HEENT: MMM.  Vision and hearing grossly intact.  NECK: Supple.  No apparent JVD.  RESP:  No IWOB.  Fair aeration bilaterally. CVS:  RRR. Heart sounds normal.  ABD/GI/GU: BS+. Abd soft, NTND.  MSK/EXT:  Moves extremities. No apparent deformity. No edema.  SKIN: no apparent skin lesion or wound NEURO: Awake, alert and oriented appropriately.  Residual aphasia and RUE weakness. PSYCH: Calm. Normal affect.  Procedures:  None  Microbiology  summarized: [id number] and influenza PCR nonreactive.  Assessment & Plan: AKI on CKD-5 with azotemia/proteinuria: Suspect prerenal etiology from poor p.o. intake and nausea.  Also on HCTZ and losartan which could contribute.  She denies NSAID use.  UA with > 300 protein.  UPC 4.57 concerning for nephropathy.  Renal US negative.  Uric acid 6.8.  Excellent urine output.  Seen by nephrologist over a year ago and did not follow-up. Recent Labs    06/12/20 0204 06/12/20 1605 06/13/20 0809 06/17/20 0454 06/23/20 [id number] 09/12/20 1859 09/12/20 2025 09/13/20 0438 09/14/20 0546 09/15/20 0517 09/16/20 0523  BUN 31*  --  29* 36* 27* 53* 47* 49* 46* 42* 40*  CREATININE 3.67* 3.65* 3.98* 4.42* 3.92* 5.85* 6.30* 5.60* 5.30* 4.88* 4.71*  -Discontinue IV fluid.  Recheck renal function in the morning.  Could be discharged home if stable or better. -Nephrology consulted on admission and suggested reconsult if worse. -Outpatient follow-up with her nephrologist arranged for 5/24 at 10:45 AM.  Anemia of renal disease: some drop in Hgb likely dilutional.  Denies melena or hematochezia.  Received 1 unit on 5/1 with appropriate response.  Anemia panel consistent with anemia of chronic disease Recent Labs    06/13/20 0809 06/14/20 0553 06/19/20 1013 09/12/20 1859 09/12/20 2025 09/13/20 0438 09/14/20 0546 09/14/20 2010 09/15/20 0517 09/16/20 0523  HGB 7.8* 7.7* 8.8* 10.3* 10.5*  8.2* 7.4* 8.9* 8.6* 8.6*  -Continue monitoring  History of CVA with residual aphasia and RUE weakness: Stable -Continue Statin and Plavix -Assess mobility per protocol  Uncontrolled hypertension: SBP in 140s. -Continue home amlodipine -Changed metoprolol to Coreg for better blood pressure control on 4/30 -Increased hydralazine to 50 mg 3 times daily -Continue as needed hydralazine as well -Discontinue HCTZ given her renal function -Continue holding losartan  Uncontrolled IDDM-2 with hyperglycemia, CKD-5 and  macrovascular complication: [id number]. Recent Labs  Lab 09/15/20 1125 09/15/20 1654 09/15/20 2150 09/16/20 0735 09/16/20 1206  GLUCAP 164* 150* 176* 95 223*  -Continue SSI-sensitive -Continue Lantus 10 units at night -Continue home statin  Metabolic acidosis: Likely due to renal failure. -Start sodium bicarb  Hypokalemia: Resolved.  Hyperphosphatemia: P 4.9.  Improved. -Recheck in the morning  Hyponatremia: Resolved.  Debility: Walker and wheelchair dependent at baseline. -Patient to resume outpatient therapy on discharge.  Abdominal pain: Reports RLQ pain with movement.  No other GI or UTI symptoms.  Abdominal exam benign.  KUB without significant finding other than a stool in ascending colon.  Resolved. -MiraLAX and Senokot-S as needed for constipation. -Continue monitoring  Pruritus: Due to renal failure?  No skin rash. -Change Benadryl to Atarax which helped more  Inadequate oral intake Body mass index is 27.07 kg/m. Nutrition Problem: Inadequate oral intake Etiology: poor appetite Signs/Symptoms: per patient/family report Interventions: Boost Breeze,Snacks   DVT prophylaxis:  SCDs Start: 09/12/20 2153  Code Status: Full code Family Communication: Updated patient's daughter at bedside. Level of care: Med-Surg Status is: Inpatient  Remains inpatient appropriate because:Inpatient level of care appropriate due to severity of illness   Dispo: The patient is from: Home              Anticipated d/c is to: Home likely on 5/4              Patient currently is not medically stable to d/c.   Difficult to place patient No             Consultants:  Nephrology on admission   Sch Meds:  Scheduled Meds: . amLODipine  10 mg Oral Daily  . carvedilol  25 mg Oral BID WC  . clopidogrel  75 mg Oral Daily  . famotidine  20 mg Oral Daily  . feeding supplement  1 Container Oral TID BM  . FLUoxetine  20 mg Oral Daily  . hydrALAZINE  50 mg Oral Q8H  . insulin  aspart  0-9 Units Subcutaneous TID WC  . insulin glargine  10 Units Subcutaneous QHS  . melatonin  3 mg Oral QHS  . rosuvastatin  10 mg Oral Daily   Continuous Infusions:  PRN Meds:.acetaminophen **OR** acetaminophen, albuterol, hydrALAZINE, hydrOXYzine, ondansetron **OR** ondansetron (ZOFRAN) IV, polyethylene glycol, senna-docusate  Antimicrobials: Anti-infectives (From admission, onward)   None       I have personally reviewed the following labs and images: CBC: Recent Labs  Lab 09/12/20 1859 09/12/20 2025 09/13/20 0438 09/14/20 0546 09/14/20 2010 09/15/20 0517 09/16/20 0523  WBC 9.1  --  7.7 6.3  --  6.8 6.8  NEUTROABS 6.2  --   --   --   --   --   --   HGB 10.3*   < > 8.2* 7.4* 8.9* 8.6* 8.6*  HCT 31.3*   < > 24.7* 23.0* 27.3* 26.1* 27.3*  MCV 85.3  --  85.2 87.8  --  85.6 87.2  PLT 271  --  221 205  --  196 183   < > = values in this interval not displayed.   BMP &GFR Recent Labs  Lab 09/12/20 1859 09/12/20 2025 09/13/20 0438 09/14/20 0546 09/15/20 0517 09/16/20 0523  NA 134* 137 137 140 143 138  K 3.5 3.6 3.3* 3.8 4.0 3.9  CL 97* 98 106 113* 115* 112*  CO2 25  --  23 21* 20* 18*  GLUCOSE 184* 182* 137* 144* 84 111*  BUN 53* 47* 49* 46* 42* 40*  CREATININE 5.85* 6.30* 5.60* 5.30* 4.88* 4.71*  CALCIUM 9.5  --  8.7* 8.5* 9.0 8.9  MG 2.4  --  2.1 2.0 1.9 1.7  PHOS 5.7*  --  5.2* 4.9* 4.9* 4.7*   Estimated Creatinine Clearance: 12 mL/min (A) (by C-G formula based on SCr of 4.71 mg/dL (H)). Liver & Pancreas: Recent Labs  Lab 09/12/20 1859 09/13/20 0438 09/14/20 0546 09/15/20 0517 09/16/20 0523  AST 14* 11*  --   --   --   ALT 10 8  --   --   --   ALKPHOS 75 59  --   --   --   BILITOT 0.5 0.3  --   --   --   PROT 8.7* 6.7  --   --   --   ALBUMIN 3.8 2.9* 2.5* 2.6* 2.8*   No results for input(s): LIPASE, AMYLASE in the last 168 hours. No results for input(s): AMMONIA in the last 168 hours. Diabetic: No results for input(s): HGBA1C in the last 72  hours. Recent Labs  Lab 09/15/20 1125 09/15/20 1654 09/15/20 2150 09/16/20 0735 09/16/20 1206  GLUCAP 164* 150* 176* 95 223*   Cardiac Enzymes: Recent Labs  Lab 09/14/20 0546  CKTOTAL 34*   No results for input(s): PROBNP in the last 8760 hours. Coagulation Profile: No results for input(s): INR, PROTIME in the last 168 hours. Thyroid Function Tests: No results for input(s): TSH, T4TOTAL, FREET4, T3FREE, THYROIDAB in the last 72 hours. Lipid Profile: No results for input(s): CHOL, HDL, LDLCALC, TRIG, CHOLHDL, LDLDIRECT in the last 72 hours. Anemia Panel: Recent Labs    09/14/20 1052  VITAMINB12 264  FOLATE 8.4  FERRITIN 25  TIBC 220*  IRON 44  RETICCTPCT 1.2   Urine analysis:    Component Value Date/Time   COLORURINE STRAW (A) 09/12/2020 2208   APPEARANCEUR CLEAR 09/12/2020 2208   LABSPEC 1.007 09/12/2020 2208   PHURINE 6.0 09/12/2020 2208   GLUCOSEU >=500 (A) 09/12/2020 2208   HGBUR NEGATIVE 09/12/2020 2208   BILIRUBINUR NEGATIVE 09/12/2020 2208   KETONESUR NEGATIVE 09/12/2020 2208   PROTEINUR >=300 (A) 09/12/2020 2208   NITRITE NEGATIVE 09/12/2020 2208   LEUKOCYTESUR NEGATIVE 09/12/2020 2208   Sepsis Labs: Invalid input(s): PROCALCITONIN, [place]  Microbiology: Recent Results (from the past 240 hour(s))  Resp Panel by RT-PCR (Flu A&B, Covid) Nasopharyngeal Swab     Status: None   Collection Time: 09/12/20  8:01 PM   Specimen: Nasopharyngeal Swab; Nasopharyngeal(NP) swabs in vial transport medium  Result Value Ref Range Status   SARS Coronavirus 2 by RT PCR NEGATIVE NEGATIVE Final    Comment: (NOTE) SARS-CoV-2 target nucleic acids are NOT DETECTED.  The SARS-CoV-2 RNA is generally detectable in upper respiratory specimens during the acute phase of infection. The lowest concentration of SARS-CoV-2 viral copies this assay can detect is 138 copies/mL. A negative result does not preclude SARS-Cov-2 infection and should not be used as the sole basis  for treatment or other patient management decisions. A  negative result may occur with  improper specimen collection/handling, submission of specimen other than nasopharyngeal swab, presence of viral mutation(s) within the areas targeted by this assay, and inadequate number of viral copies(<138 copies/mL). A negative result must be combined with clinical observations, patient history, and epidemiological information. The expected result is Negative.  Fact Sheet for Patients:  EntrepreneurPulse.com.au  Fact Sheet for Healthcare Providers:  IncredibleEmployment.be  This test is no t yet approved or cleared by the [place] FDA and  has been authorized for detection and/or diagnosis of SARS-CoV-2 by FDA under an Emergency Use Authorization (EUA). This EUA will remain  in effect (meaning this test can be used) for the duration of the COVID-19 declaration under Section 564(b)(1) of the Act, 21 U.S.C.section 360bbb-3(b)(1), unless the authorization is terminated  or revoked sooner.       Influenza A by PCR NEGATIVE NEGATIVE Final   Influenza B by PCR NEGATIVE NEGATIVE Final    Comment: (NOTE) The Xpert Xpress SARS-CoV-2/FLU/RSV plus assay is intended as an aid in the diagnosis of influenza from Nasopharyngeal swab specimens and should not be used as a sole basis for treatment. Nasal washings and aspirates are unacceptable for Xpert Xpress SARS-CoV-2/FLU/RSV testing.  Fact Sheet for Patients: EntrepreneurPulse.com.au  Fact Sheet for Healthcare Providers: IncredibleEmployment.be  This test is not yet approved or cleared by the [place] FDA and has been authorized for detection and/or diagnosis of SARS-CoV-2 by FDA under an Emergency Use Authorization (EUA). This EUA will remain in effect (meaning this test can be used) for the duration of the COVID-19 declaration under Section 564(b)(1) of the Act, 21  U.S.C. section 360bbb-3(b)(1), unless the authorization is terminated or revoked.  Performed at [facility], [place] [address] [place], [place] [address]     Radiology Studies: No results found.    Cullin Dishman T. Leedey  If 7PM-7AM, please contact night-coverage [email or web address] 09/16/2020, 2:52 PM

## 2020-09-17 ENCOUNTER — Other Ambulatory Visit: Payer: Self-pay

## 2020-09-17 DIAGNOSIS — E1365 Other specified diabetes mellitus with hyperglycemia: Secondary | ICD-10-CM

## 2020-09-17 DIAGNOSIS — N185 Chronic kidney disease, stage 5: Secondary | ICD-10-CM | POA: Diagnosis not present

## 2020-09-17 DIAGNOSIS — N179 Acute kidney failure, unspecified: Secondary | ICD-10-CM | POA: Diagnosis not present

## 2020-09-17 DIAGNOSIS — E1322 Other specified diabetes mellitus with diabetic chronic kidney disease: Secondary | ICD-10-CM

## 2020-09-17 DIAGNOSIS — Z8673 Personal history of transient ischemic attack (TIA), and cerebral infarction without residual deficits: Secondary | ICD-10-CM | POA: Diagnosis not present

## 2020-09-17 DIAGNOSIS — D631 Anemia in chronic kidney disease: Secondary | ICD-10-CM

## 2020-09-17 LAB — CBC
HCT: 24.6 % — ABNORMAL LOW (ref 36.0–46.0)
Hemoglobin: 7.9 g/dL — ABNORMAL LOW (ref 12.0–15.0)
MCH: 28 pg (ref 26.0–34.0)
MCHC: 32.1 g/dL (ref 30.0–36.0)
MCV: 87.2 fL (ref 80.0–100.0)
Platelets: 179 10*3/uL (ref 150–400)
RBC: 2.82 MIL/uL — ABNORMAL LOW (ref 3.87–5.11)
RDW: 13.9 % (ref 11.5–15.5)
WBC: 6.4 10*3/uL (ref 4.0–10.5)
nRBC: 0 % (ref 0.0–0.2)

## 2020-09-17 LAB — RENAL FUNCTION PANEL
Albumin: 2.5 g/dL — ABNORMAL LOW (ref 3.5–5.0)
Anion gap: 7 (ref 5–15)
BUN: 40 mg/dL — ABNORMAL HIGH (ref 6–20)
CO2: 18 mmol/L — ABNORMAL LOW (ref 22–32)
Calcium: 8.7 mg/dL — ABNORMAL LOW (ref 8.9–10.3)
Chloride: 112 mmol/L — ABNORMAL HIGH (ref 98–111)
Creatinine, Ser: 5.21 mg/dL — ABNORMAL HIGH (ref 0.44–1.00)
GFR, Estimated: 9 mL/min — ABNORMAL LOW (ref >60)
Glucose, Bld: 107 mg/dL — ABNORMAL HIGH (ref 70–99)
Phosphorus: 5.1 mg/dL — ABNORMAL HIGH (ref 2.5–4.6)
Potassium: 3.8 mmol/L (ref 3.5–5.1)
Sodium: 137 mmol/L (ref 135–145)

## 2020-09-17 LAB — MAGNESIUM: Magnesium: 1.6 mg/dL — ABNORMAL LOW (ref 1.7–2.4)

## 2020-09-17 LAB — GLUCOSE, CAPILLARY: Glucose-Capillary: 94 mg/dL (ref 70–99)

## 2020-09-17 MED ORDER — SENNOSIDES-DOCUSATE SODIUM 8.6-50 MG PO TABS: 1.0000 | ORAL_TABLET | Freq: Two times a day (BID) | ORAL | Status: DC | PRN

## 2020-09-17 MED ORDER — CARVEDILOL 25 MG PO TABS: 25.0000 mg | ORAL_TABLET | Freq: Two times a day (BID) | ORAL | 1 refills | Status: DC

## 2020-09-17 MED ORDER — SODIUM BICARBONATE 650 MG PO TABS: 650.0000 mg | ORAL_TABLET | Freq: Three times a day (TID) | ORAL | 1 refills | Status: DC

## 2020-09-17 MED ORDER — MAGNESIUM SULFATE IN D5W 1-5 GM/100ML-% IV SOLN
1.0000 g | Freq: Once | INTRAVENOUS | Status: AC
  Administered 2020-09-17: 1 g via INTRAVENOUS
  Filled 2020-09-17: qty 100

## 2020-09-17 MED ORDER — HYDRALAZINE HCL 50 MG PO TABS: 50.0000 mg | ORAL_TABLET | Freq: Three times a day (TID) | ORAL | 1 refills | Status: DC

## 2020-09-17 NOTE — Discharge Summary (Signed)
Physician Discharge Summary  Hannah [name] [id number] DOB: 01-Apr-1966 DOA: 09/12/2020  PCP: Vernie Shanks, MD  Admit date: 09/12/2020 Discharge date: 09/17/2020  Admitted From: Home Disposition: Home  Recommendations for Outpatient Follow-up:  1. Follow ups as below. 2. Please obtain CBC/BMP/Mag at follow up 3. Please follow up on the following pending results: None  Home Health: None required Equipment/Devices: Patient has walker and wheelchair at home.  Discharge Condition: Stable CODE STATUS: Full code   Follow-up Information    Rexene Agent, MD Follow up on 10/07/2020.   Specialty: Nephrology Why: you have follow-up kidney appt w/ P.A. for Dr Joelyn Oms Tobie Poet, P.A.) on May 24th at 10:45 am, please arrive at 10:15 for paperwork.  Address and phone are as above.  Contact information: [place] [address] [phone]        Vernie Shanks, MD. Schedule an appointment as soon as possible for a visit in 1 week(s).   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  Patient has had decreased p.o. intake and nausea for days prior to presenting to PCP.  She is also on ARB/HCTZ for blood pressure.  On admission, Cr up to 6.3 from baseline of 4.0.  Started on IV fluid.  Renal ultrasound without acute finding.  Creatinine improving with IV fluid but slightly went up after stopping IV fluid.  Discussed with nephrology, Dr. Jonnie Finner who is okay with discharging patient for outpatient follow-up.  He kindly arrange outpatient follow-up with her nephrologist as above.  We have stopped losartan and HCTZ.  Changed metoprolol to Coreg for better blood pressure control.  Also started hydralazine.    See individual problem list below for more on hospital course.  Discharge Diagnoses:  AKI on CKD-5 with azotemia/proteinuria: Suspect prerenal etiology from poor p.o. intake and nausea.  Also on HCTZ and losartan which could contribute.  She denies NSAID use.  UA with > 300 protein.  UPC 4.57 concerning for nephropathy.  Renal US negative.  Uric acid 6.8.    Good urine output. Seen by nephrologist over a year ago and did not follow-up.  Recent Labs    06/13/20 0809 06/17/20 0454 06/23/20 0646 09/12/20 1859 09/12/20 2025 09/13/20 0438 09/14/20 0546 09/15/20 0517 09/16/20 0523 09/17/20 0513  BUN 29* 36* 27* 53* 47* 49* 46* 42* 40* 40*  CREATININE 3.98* 4.42* 3.92* 5.85* 6.30* 5.60* 5.30* 4.88* 4.71* 5.21*  -Discontinue losartan and HCTZ on discharge. -Recheck renal function at follow-up -Outpatient follow-up with her nephrologist arranged for 5/24 at 10:45 AM.  Anemia of renal disease: some drop in Hgb likely dilutional.  Denies melena or hematochezia.  Received 1 unit on 5/1 with appropriate response.  Anemia panel consistent with anemia of chronic disease Recent Labs    06/14/20 0553 06/19/20 1013 09/12/20 1859 09/12/20 2025 09/13/20 0438 09/14/20 0546 09/14/20 2010 09/15/20 0517 09/16/20 0523 09/17/20 0513  HGB 7.7* 8.8* 10.3* 10.5* 8.2* 7.4* 8.9* 8.6* 8.6* 7.9*  -Recheck CBC at follow-up. -She may need Epo  History of CVA with residual aphasia and RUE weakness: Stable -ContinueStatin and Plavix  Uncontrolled hypertension: SBP ranges from 140-150 -Continue home amlodipine 10 mg daily -Changed metoprolol to Coreg for better  blood pressure control -Increased hydralazine to 50 mg 3 times daily -Discontinued HCTZ and losartan.  Uncontrolled IDDM-2 with hyperglycemia, CKD-5 and macrovascular complication: [id number]. Recent Labs  Lab 09/16/20 0735 09/16/20 1206 09/16/20 1731 09/16/20 2126 09/17/20 0739  GLUCAP 95 223* 192* 129* 94  -Discharged on home  medications  Metabolic acidosis: Likely due to renal failure. -Started sodium bicarb  Hypokalemia: Resolved.  Hyperphosphatemia: P 4.9.  Improved. -Recheck in the morning  Hyponatremia: Resolved.  Debility: Walker and wheelchair dependent at baseline. -Patient to resume outpatient therapy on discharge.  Abdominal pain: Reports RLQ pain with movement.  No other GI or UTI symptoms.  Abdominal exam benign.  KUB without significant finding other than a stool in ascending colon.  Resolved. -Bowel regimen  Pruritus: Due to renal failure?  No skin rash.  Resolved.  Inadequate oral intake Body mass index is 27.34 kg/m. Nutrition Problem: Inadequate oral intake Etiology: poor appetite Signs/Symptoms: per patient/family report Interventions: Boost Breeze,Snacks      Discharge Exam: Vitals:   09/17/20 0536 09/17/20 0824  BP: (!) 150/70 (!) 152/75  Pulse: 95 92  Resp: 17   Temp: 98 F (36.7 C)   SpO2: 93%     GENERAL: No apparent distress.  Nontoxic. HEENT: MMM.  Vision and hearing grossly intact.  NECK: Supple.  No apparent JVD.  RESP: On RA.  No IWOB.  Fair aeration bilaterally. CVS:  RRR. Heart sounds normal.  ABD/GI/GU: Bowel sounds present. Soft. Non tender.  MSK/EXT:  Moves extremities. No apparent deformity. No edema.  SKIN: no apparent skin lesion or wound NEURO: Awake, alert and oriented appropriately.  Residual aphasia and RUE weakness PSYCH: Calm. Normal affect.  Discharge Instructions  Discharge Instructions    Call MD for:  difficulty breathing, headache or visual disturbances   Complete by: As directed    Call MD for:  extreme fatigue   Complete by: As directed    Call MD for:  persistant dizziness or light-headedness   Complete by: As directed    Call MD for:  persistant nausea and vomiting   Complete by: As directed    Call MD for:  temperature >100.4   Complete by: As directed    Diet - low sodium heart healthy   Complete by: As directed     Diet Carb Modified   Complete by: As directed    Discharge instructions   Complete by: As directed    It has been a pleasure taking care of you!  You were hospitalized due to acute kidney injury.  This is improved with intravenous fluid and adjustment of your blood pressure medications.  Please review your new medication list and the directions on your medications before you take them.  Follow-up with your primary care doctor to have your blood pressure and kidney numbers rechecked in 1 week.  Also go to your appointment with your kidney doctor on 10/07/2020.   Take care,   Increase activity slowly   Complete by: As directed      Allergies as of 09/17/2020      Reactions   Ibuprofen Other (See Comments), Swelling   Edema Swelling to hands, feet and face   Influenza Vaccines Anaphylaxis   Swelling, fever   Pneumococcal Vaccine Anaphylaxis   Swelling, fever Swelling, fever   Promethazine Other (See Comments)   hallucinations hallucinations   Ambien [zolpidem]    Other reaction(s): Unknown   Cyclobenzaprine Other (See Comments)   hallucinations   Tape Itching  Tramadol Hcl    Other reaction(s): hallucinations   Oxycodone Nausea Only, Nausea And Vomiting      Medication List    STOP taking these medications   hydrochlorothiazide 25 MG tablet Commonly known as: HYDRODIURIL   losartan 50 MG tablet Commonly known as: COZAAR   metoprolol tartrate 100 MG tablet Commonly known as: LOPRESSOR     TAKE these medications   acetaminophen 325 MG tablet Commonly known as: TYLENOL Take 2 tablets (650 mg total) by mouth every 4 (four) hours as needed for mild pain (or temp > 37.5 C (99.5 F)).   amLODipine 10 MG tablet Commonly known as: NORVASC Take 1 tablet (10 mg total) by mouth daily.   carvedilol 25 MG tablet Commonly known as: COREG Take 1 tablet (25 mg total) by mouth 2 (two) times daily with a meal.   clopidogrel 75 MG tablet Commonly known as: PLAVIX Take 1  tablet (75 mg total) by mouth daily.   diclofenac Sodium 1 % Gel Commonly known as: VOLTAREN Apply 2 g topically 4 (four) times daily. What changed:   when to take this  reasons to take this   ergocalciferol 1.25 MG (50000 UT) capsule Commonly known as: VITAMIN D2 Take 50,000 Units by mouth once a week.   famotidine 20 MG tablet Commonly known as: PEPCID Take 1 tablet (20 mg total) by mouth daily.   FLUoxetine 20 MG capsule Commonly known as: PROZAC Take 1 capsule (20 mg total) by mouth daily.   glipiZIDE 10 MG tablet Commonly known as: GLUCOTROL Take 1 tablet (10 mg total) by mouth 2 (two) times daily before a meal.   hydrALAZINE 50 MG tablet Commonly known as: APRESOLINE Take 1 tablet (50 mg total) by mouth every 8 (eight) hours. What changed:   medication strength  how much to take   insulin lispro 100 UNIT/ML KwikPen Junior Generic drug: insulin lispro Inject 10 Units into the skin 3 (three) times daily.   melatonin 3 MG Tabs tablet Take 1 tablet (3 mg total) by mouth at bedtime.   nitroGLYCERIN 0.4 MG SL tablet Commonly known as: NITROSTAT Place 1 tablet (0.4 mg total) under the tongue every 5 (five) minutes as needed for chest pain.   rosuvastatin 20 MG tablet Commonly known as: CRESTOR Take 1 tablet (20 mg total) by mouth daily. What changed: how much to take   senna-docusate 8.6-50 MG tablet Commonly known as: Senokot-S Take 1 tablet by mouth 2 (two) times daily as needed for moderate constipation.   sodium bicarbonate 650 MG tablet Take 1 tablet (650 mg total) by mouth 3 (three) times daily.   tiZANidine 2 MG tablet Commonly known as: ZANAFLEX Take 1 tablet (2 mg total) by mouth 3 (three) times daily.   Tyler Aas FlexTouch 100 UNIT/ML FlexTouch Pen Generic drug: insulin degludec Inject 0-20 Units into the skin at bedtime. Sliding scale       Consultations:  Nephrology over the phone  Procedures/Studies:   DG Abd 1 View  Result  Date: 09/15/2020 CLINICAL DATA:  Right lower quadrant abdominal pain. EXAM: ABDOMEN - 1 VIEW COMPARISON:  None. FINDINGS: The bowel gas pattern is normal. A moderate amount of stool is seen within the ascending colon. No radio-opaque calculi or other significant radiographic abnormality are seen. IMPRESSION: Negative. Electronically Signed   By: Virgina Norfolk M.D.   On: 09/15/2020 04:03   US Renal  Result Date: 09/12/2020 CLINICAL DATA:  Acute renal failure. EXAM: RENAL / URINARY TRACT ULTRASOUND  COMPLETE COMPARISON:  None. FINDINGS: Right Kidney: Renal measurements: 9.4 cm x 4.0 cm x 4.7 cm = volume: 92.0 mL. Diffusely increased echogenicity of the renal parenchyma is noted. A 2.7 cm x 2.7 cm x 2.5 cm anechoic structure is seen within the right kidney. No abnormal flow is noted within this region on color Doppler evaluation. No hydronephrosis is visualized. Left Kidney: Renal measurements: 9.9 cm x 6.0 cm x 5.8 cm = volume: 182.1 mL. Diffusely increased echogenicity of the renal parenchyma is noted. A 1.2 cm x 1.4 cm x 1.0 cm anechoic structure is seen within the left kidney. No abnormal flow is noted within this region on color Doppler evaluation. No hydronephrosis is visualized. Bladder: Appears normal for degree of bladder distention. Bilateral ureteral jets are visualized. Other: None. IMPRESSION: 1. Bilateral simple renal cysts. 2. Increased echogenicity of the kidneys which may be secondary to medical renal disease. Electronically Signed   By: Virgina Norfolk M.D.   On: 09/12/2020 20:54        The results of significant diagnostics from this hospitalization (including imaging, microbiology, ancillary and laboratory) are listed below for reference.     Microbiology: Recent Results (from the past 240 hour(s))  Resp Panel by RT-PCR (Flu A&B, Covid) Nasopharyngeal Swab     Status: None   Collection Time: 09/12/20  8:01 PM   Specimen: Nasopharyngeal Swab; Nasopharyngeal(NP) swabs in vial  transport medium  Result Value Ref Range Status   SARS Coronavirus 2 by RT PCR NEGATIVE NEGATIVE Final    Comment: (NOTE) SARS-CoV-2 target nucleic acids are NOT DETECTED.  The SARS-CoV-2 RNA is generally detectable in upper respiratory specimens during the acute phase of infection. The lowest concentration of SARS-CoV-2 viral copies this assay can detect is 138 copies/mL. A negative result does not preclude SARS-Cov-2 infection and should not be used as the sole basis for treatment or other patient management decisions. A negative result may occur with  improper specimen collection/handling, submission of specimen other than nasopharyngeal swab, presence of viral mutation(s) within the areas targeted by this assay, and inadequate number of viral copies(<138 copies/mL). A negative result must be combined with clinical observations, patient history, and epidemiological information. The expected result is Negative.  Fact Sheet for Patients:  EntrepreneurPulse.com.au  Fact Sheet for Healthcare Providers:  IncredibleEmployment.be  This test is no t yet approved or cleared by the [place] FDA and  has been authorized for detection and/or diagnosis of SARS-CoV-2 by FDA under an Emergency Use Authorization (EUA). This EUA will remain  in effect (meaning this test can be used) for the duration of the COVID-19 declaration under Section 564(b)(1) of the Act, 21 U.S.C.section 360bbb-3(b)(1), unless the authorization is terminated  or revoked sooner.       Influenza A by PCR NEGATIVE NEGATIVE Final   Influenza B by PCR NEGATIVE NEGATIVE Final    Comment: (NOTE) The Xpert Xpress SARS-CoV-2/FLU/RSV plus assay is intended as an aid in the diagnosis of influenza from Nasopharyngeal swab specimens and should not be used as a sole basis for treatment. Nasal washings and aspirates are unacceptable for Xpert Xpress SARS-CoV-2/FLU/RSV testing.  Fact  Sheet for Patients: EntrepreneurPulse.com.au  Fact Sheet for Healthcare Providers: IncredibleEmployment.be  This test is not yet approved or cleared by the [place] FDA and has been authorized for detection and/or diagnosis of SARS-CoV-2 by FDA under an Emergency Use Authorization (EUA). This EUA will remain in effect (meaning this test can be used) for the duration of  the COVID-19 declaration under Section 564(b)(1) of the Act, 21 U.S.C. section 360bbb-3(b)(1), unless the authorization is terminated or revoked.  Performed at [facility], [place] [address], [place], [place] [address]      Labs:  CBC: Recent Labs  Lab 09/12/20 1859 09/12/20 2025 09/13/20 0438 09/14/20 0546 09/14/20 2010 09/15/20 0517 09/16/20 0523 09/17/20 0513  WBC 9.1  --  7.7 6.3  --  6.8 6.8 6.4  NEUTROABS 6.2  --   --   --   --   --   --   --   HGB 10.3*   < > 8.2* 7.4* 8.9* 8.6* 8.6* 7.9*  HCT 31.3*   < > 24.7* 23.0* 27.3* 26.1* 27.3* 24.6*  MCV 85.3  --  85.2 87.8  --  85.6 87.2 87.2  PLT 271  --  221 205  --  196 183 179   < > = values in this interval not displayed.   BMP &GFR Recent Labs  Lab 09/13/20 0438 09/14/20 0546 09/15/20 0517 09/16/20 0523 09/17/20 0513  NA 137 140 143 138 137  K 3.3* 3.8 4.0 3.9 3.8  CL 106 113* 115* 112* 112*  CO2 23 21* 20* 18* 18*  GLUCOSE 137* 144* 84 111* 107*  BUN 49* 46* 42* 40* 40*  CREATININE 5.60* 5.30* 4.88* 4.71* 5.21*  CALCIUM 8.7* 8.5* 9.0 8.9 8.7*  MG 2.1 2.0 1.9 1.7 1.6*  PHOS 5.2* 4.9* 4.9* 4.7* 5.1*   Estimated Creatinine Clearance: 10.9 mL/min (A) (by C-G formula based on SCr of 5.21 mg/dL (H)). Liver & Pancreas: Recent Labs  Lab 09/12/20 1859 09/13/20 0438 09/14/20 0546 09/15/20 0517 09/16/20 0523 09/17/20 0513  AST 14* 11*  --   --   --   --   ALT 10 8  --   --   --   --   ALKPHOS 75 59  --   --   --   --   BILITOT 0.5 0.3  --   --   --   --   PROT 8.7* 6.7  --   --    --   --   ALBUMIN 3.8 2.9* 2.5* 2.6* 2.8* 2.5*   No results for input(s): LIPASE, AMYLASE in the last 168 hours. No results for input(s): AMMONIA in the last 168 hours. Diabetic: No results for input(s): HGBA1C in the last 72 hours. Recent Labs  Lab 09/16/20 0735 09/16/20 1206 09/16/20 1731 09/16/20 2126 09/17/20 0739  GLUCAP 95 223* 192* 129* 94   Cardiac Enzymes: Recent Labs  Lab 09/14/20 0546  CKTOTAL 34*   No results for input(s): PROBNP in the last 8760 hours. Coagulation Profile: No results for input(s): INR, PROTIME in the last 168 hours. Thyroid Function Tests: No results for input(s): TSH, T4TOTAL, FREET4, T3FREE, THYROIDAB in the last 72 hours. Lipid Profile: No results for input(s): CHOL, HDL, LDLCALC, TRIG, CHOLHDL, LDLDIRECT in the last 72 hours. Anemia Panel: No results for input(s): VITAMINB12, FOLATE, FERRITIN, TIBC, IRON, RETICCTPCT in the last 72 hours. Urine analysis:    Component Value Date/Time   COLORURINE STRAW (A) 09/12/2020 2208   APPEARANCEUR CLEAR 09/12/2020 2208   LABSPEC 1.007 09/12/2020 2208   PHURINE 6.0 09/12/2020 2208   GLUCOSEU >=500 (A) 09/12/2020 2208   HGBUR NEGATIVE 09/12/2020 2208   BILIRUBINUR NEGATIVE 09/12/2020 2208   KETONESUR NEGATIVE 09/12/2020 2208   PROTEINUR >=300 (A) 09/12/2020 2208   NITRITE NEGATIVE 09/12/2020 2208   LEUKOCYTESUR NEGATIVE 09/12/2020 2208  Sepsis Labs: Invalid input(s): PROCALCITONIN, LACTICIDVEN   Time coordinating discharge: 40 minutes  SIGNED:  Mercy Riding, MD  [facility] 09/17/2020, 6:39 PM  If 7PM-7AM, please contact night-coverage [email or web address]

## 2020-09-18 ENCOUNTER — Other Ambulatory Visit: Payer: Self-pay | Admitting: *Deleted

## 2020-09-18 NOTE — Patient Outreach (Signed)
[facility]) Care Management  09/18/2020  Hannah [name] 08/30/1965 [id number]   Telephone Screen  Referral Date : 09/18/20 Referral Source: [facility] Liaison  Referral Reason: [facility] Disease Management , Diabetes, CKD Insurance: [place] [employer]    56 year old female with PMH CKD4, Diabetes type 2, A1 8.7% on 09/13/20, CVA left Hemiparesis 06/09/20 , Dyslipidemia.    Outreach Attempt #1 Subjective: Outreach call to patient contact number unsuccessful received message, call can't be completed as dialed called party temporarily unavailable.   Placed call to [name] daughter on [employer], unsuccessful outreach , able to leave a HIPAA compliant voice mail message for return call.    Plan If no return call will schedule follow up call in the next 4 business days. Will send patient unsuccessful outreach letter     Joylene Draft, RN, BSN  [facility] Management Coordinator  [phone]- Mobile [phone]- [place]

## 2020-09-23 ENCOUNTER — Other Ambulatory Visit: Payer: Self-pay | Admitting: *Deleted

## 2020-09-23 NOTE — Patient Outreach (Signed)
[facility]) Care Management  09/23/2020  Hannah [name] 12-10-1965 [id number]   Telephone Screen  Referral Date : 09/18/20 Referral Source: [facility] Liaison  Referral Reason: [facility] Disease Management , Diabetes, CKD Insurance: [place] [employer]    55 year old female with PMH CKD4, Diabetes type 2, A1 8.7% on 09/13/20, CVA left Hemiparesis 06/09/20 , Dyslipidemia.   #2 Outreach Attempt  Subjective: Outreach call to patient contact number unsuccessful received message, call can't be completed as dialed called party temporarily unavailable.  Outreach call to Hannah [name], DPR, explained reason for the call ,she is currently at work provided updated contact number for patient as well as patient other daughter Hannah [name] that is caregiver for the patient.  Placed return call to Mrs.Hannah [name], introduced self explained reason for the call and [facility] care management .  Patient reports that she is okay still not feeling as well. She reports appetite and intake still not good. Patient reports trying to rest awaken by phone agrees that I can speak with her other daughter Hannah [name]  staying with her during the day.  Hannah [name] states they are still trying to work with patient on increasing her food and liquid intake. She does not like the taste of nutrition drinks such as ensure/boost  They are trying different foods for her. Patient has denies nausea, vomiting or diarrhea.   Social Patient lives at home with her daughters , she has 24 hour assistance available. Per family patient uses mostly her walker for mobility in the home and they provide standby assistance. She is able to walk to the bathroom.  Family provides transportation to appointments and assist with meals and all care needed.   Condition Patient has history of CVA's in the past with right side weakness, family reports patient usually up in wheel chair daily.  Daughter states patient has mostly been resting in bed  today, other than going to the bathroom.  Family has scheduled office visit with PCP for 5/13, reinforced notifying MD sooner of concerns of increased weakness, nausea vomiting,  Not tolerating fluids nutrition or less  trips to bathroom . During call patient called out to daughter for fluids to drink.  She has history of Diabetes A1c 8.7%, family states that have been working on getting that under control . Patient has freestyle libre and monitoring blood sugars at least 3 times a day. She denies having episodes of low blood sugar, reading yesterday up to 277 but daughter reports okay on today unable to state number. Reviewed signs symptoms of low blood sugar and steps to treating she voiced understanding.  Daughter states that she is unsure of patient blood pressure or if other sister check it , patient does have a monitor at home, encouraged to monitor reading at home.    Medications Family assist patient by placing medication in organizer and patient able to take from that. Reports that she also administers her insulin.   Patient/daughter explained [facility] care management services and agreeable to services for post discharge follow up and education and support of chronic conditions.  Plan Provided [facility] care manager contact number for new concerns to call sooner.  Patient/daughter agreeable to follow up call in the next week.  Will send patient welcome letter.     Goals Addressed            This Visit's Progress   . Improve My Nutrition with the Foods I Eat and Drink       Timeframe:  Long-Range Goal Priority:  Medium Start Date:    09/23/20                         Expected End Date:11/13/20                     Follow Up Date 09/30/20   - choose small, frequent meals - include protein food, like egg, meat, chicken, peanut butter, beans and fish in each meal    Why is this important?    Eating 3 healthy meals, or 5 or 6 small meals, a day is very important.   A healthy diet is one  that has fruit, vegetables, protein, good fats, whole grains and low-fat dairy products.   Be sure to have plenty of protein that builds strong muscles. Many older people do not get quite enough protein.     Notes: Review choices of nutrition supplements such as boost, ensure, premier, instant breakfast carnation.     . Make and Keep All Appointments   On track    Timeframe:  Long-Range Goal Priority:  Medium Start Date:  09/23/20                           Expected End Date:  12/13/20                    Follow Up Date : 09/30/20    - keep a calendar with appointment dates    Why is this important?    Part of staying healthy is seeing the doctor for follow-up care.   If you forget your appointments, there are some things you can do to stay on track.    Notes:  Review of upcoming appointment with PCP and nephrology. Verify transportation support     . Monitor and Manage My Blood Sugar-Diabetes Type 2   On track    Timeframe:  Long-Range Goal Priority:  Medium Start Date:   09/23/20                          Expected End Date:   12/13/20                    Follow Up Date 09/30/20   - check blood sugar at prescribed times - check blood sugar if I feel it is too high or too low - take the blood sugar log to all doctor visits - take the blood sugar meter to all doctor visits    Why is this important?    Checking your blood sugar at home helps to keep it from getting very high or very low.   Writing the results in a diary or log helps the doctor know how to care for you.   Your blood sugar log should have the time, date and the results.   Also, write down the amount of insulin or other medicine that you take.   Other information, like what you ate, exercise done and how you were feeling, will also be helpful.     Notes:  Discuss type of meter for monitoring , encourage to monitor to PCP visit     . Set My Target A1C-Diabetes Type 2       Timeframe:  Long-Range Goal Priority:   Medium Start Date:  09/23/20  Expected End Date:  12/13/20                     Follow Up Date 09/30/20   - set target A1C    Why is this important?    Your target A1C is decided together by you and your doctor.   It is based on several things like your age and other health issues.    Notes:  Discussed current A1c and control range of 7.0, encouraged to discuss at PCP visit.         Joylene Draft, RN, BSN  [place] Management Coordinator  [phone]- Mobile [phone]- Toll Free Main Office

## 2020-09-26 ENCOUNTER — Other Ambulatory Visit: Payer: Self-pay

## 2020-09-26 ENCOUNTER — Encounter (HOSPITAL_COMMUNITY): Payer: Self-pay

## 2020-09-26 ENCOUNTER — Emergency Department (HOSPITAL_BASED_OUTPATIENT_CLINIC_OR_DEPARTMENT_OTHER): Payer: Medicare Other

## 2020-09-26 ENCOUNTER — Emergency Department (HOSPITAL_COMMUNITY): Payer: Medicare Other

## 2020-09-26 ENCOUNTER — Inpatient Hospital Stay (HOSPITAL_COMMUNITY)
Admission: EM | Admit: 2020-09-26 | Discharge: 2020-10-18 | DRG: 628 | Disposition: A | Discharge status: Rehabilitation Facility | Payer: Medicare Other | Attending: Internal Medicine | Admitting: Internal Medicine

## 2020-09-26 DIAGNOSIS — N189 Chronic kidney disease, unspecified: Secondary | ICD-10-CM

## 2020-09-26 DIAGNOSIS — I120 Hypertensive chronic kidney disease with stage 5 chronic kidney disease or end stage renal disease: Secondary | ICD-10-CM | POA: Diagnosis present

## 2020-09-26 DIAGNOSIS — D631 Anemia in chronic kidney disease: Secondary | ICD-10-CM | POA: Diagnosis present

## 2020-09-26 DIAGNOSIS — I10 Essential (primary) hypertension: Secondary | ICD-10-CM | POA: Diagnosis not present

## 2020-09-26 DIAGNOSIS — I69351 Hemiplegia and hemiparesis following cerebral infarction affecting right dominant side: Secondary | ICD-10-CM

## 2020-09-26 DIAGNOSIS — G35 Multiple sclerosis: Secondary | ICD-10-CM | POA: Diagnosis present

## 2020-09-26 DIAGNOSIS — E8770 Fluid overload, unspecified: Secondary | ICD-10-CM | POA: Diagnosis not present

## 2020-09-26 DIAGNOSIS — E871 Hypo-osmolality and hyponatremia: Secondary | ICD-10-CM | POA: Diagnosis not present

## 2020-09-26 DIAGNOSIS — Z794 Long term (current) use of insulin: Secondary | ICD-10-CM

## 2020-09-26 DIAGNOSIS — R0602 Shortness of breath: Secondary | ICD-10-CM | POA: Diagnosis present

## 2020-09-26 DIAGNOSIS — D509 Iron deficiency anemia, unspecified: Secondary | ICD-10-CM | POA: Diagnosis present

## 2020-09-26 DIAGNOSIS — M25551 Pain in right hip: Secondary | ICD-10-CM

## 2020-09-26 DIAGNOSIS — E876 Hypokalemia: Secondary | ICD-10-CM | POA: Diagnosis not present

## 2020-09-26 DIAGNOSIS — E872 Acidosis: Secondary | ICD-10-CM | POA: Diagnosis present

## 2020-09-26 DIAGNOSIS — Z887 Allergy status to serum and vaccine status: Secondary | ICD-10-CM

## 2020-09-26 DIAGNOSIS — Z20822 Contact with and (suspected) exposure to covid-19: Secondary | ICD-10-CM | POA: Diagnosis present

## 2020-09-26 DIAGNOSIS — D62 Acute posthemorrhagic anemia: Secondary | ICD-10-CM | POA: Diagnosis not present

## 2020-09-26 DIAGNOSIS — Z888 Allergy status to other drugs, medicaments and biological substances status: Secondary | ICD-10-CM

## 2020-09-26 DIAGNOSIS — E1122 Type 2 diabetes mellitus with diabetic chronic kidney disease: Secondary | ICD-10-CM | POA: Diagnosis present

## 2020-09-26 DIAGNOSIS — N186 End stage renal disease: Secondary | ICD-10-CM

## 2020-09-26 DIAGNOSIS — E785 Hyperlipidemia, unspecified: Secondary | ICD-10-CM | POA: Diagnosis present

## 2020-09-26 DIAGNOSIS — Z8673 Personal history of transient ischemic attack (TIA), and cerebral infarction without residual deficits: Secondary | ICD-10-CM

## 2020-09-26 DIAGNOSIS — R609 Edema, unspecified: Secondary | ICD-10-CM

## 2020-09-26 DIAGNOSIS — Z7984 Long term (current) use of oral hypoglycemic drugs: Secondary | ICD-10-CM

## 2020-09-26 DIAGNOSIS — R5381 Other malaise: Secondary | ICD-10-CM

## 2020-09-26 DIAGNOSIS — E1165 Type 2 diabetes mellitus with hyperglycemia: Secondary | ICD-10-CM | POA: Diagnosis present

## 2020-09-26 DIAGNOSIS — E1169 Type 2 diabetes mellitus with other specified complication: Secondary | ICD-10-CM | POA: Diagnosis present

## 2020-09-26 DIAGNOSIS — M7981 Nontraumatic hematoma of soft tissue: Secondary | ICD-10-CM | POA: Diagnosis not present

## 2020-09-26 DIAGNOSIS — N185 Chronic kidney disease, stage 5: Secondary | ICD-10-CM | POA: Diagnosis not present

## 2020-09-26 DIAGNOSIS — N179 Acute kidney failure, unspecified: Secondary | ICD-10-CM | POA: Diagnosis present

## 2020-09-26 DIAGNOSIS — E8779 Other fluid overload: Secondary | ICD-10-CM

## 2020-09-26 DIAGNOSIS — M898X9 Other specified disorders of bone, unspecified site: Secondary | ICD-10-CM | POA: Diagnosis present

## 2020-09-26 DIAGNOSIS — E119 Type 2 diabetes mellitus without complications: Secondary | ICD-10-CM | POA: Diagnosis present

## 2020-09-26 DIAGNOSIS — Z7902 Long term (current) use of antithrombotics/antiplatelets: Secondary | ICD-10-CM

## 2020-09-26 DIAGNOSIS — Z886 Allergy status to analgesic agent status: Secondary | ICD-10-CM

## 2020-09-26 DIAGNOSIS — I313 Pericardial effusion (noninflammatory): Secondary | ICD-10-CM | POA: Diagnosis present

## 2020-09-26 DIAGNOSIS — Z885 Allergy status to narcotic agent status: Secondary | ICD-10-CM

## 2020-09-26 DIAGNOSIS — N2581 Secondary hyperparathyroidism of renal origin: Secondary | ICD-10-CM | POA: Diagnosis present

## 2020-09-26 DIAGNOSIS — Z79899 Other long term (current) drug therapy: Secondary | ICD-10-CM

## 2020-09-26 DIAGNOSIS — Z992 Dependence on renal dialysis: Secondary | ICD-10-CM

## 2020-09-26 LAB — CBC WITH DIFFERENTIAL/PLATELET
Abs Immature Granulocytes: 0.02 10*3/uL (ref 0.00–0.07)
Basophils Absolute: 0 10*3/uL (ref 0.0–0.1)
Basophils Relative: 1 %
Eosinophils Absolute: 0.3 10*3/uL (ref 0.0–0.5)
Eosinophils Relative: 4 %
HCT: 25.5 % — ABNORMAL LOW (ref 36.0–46.0)
Hemoglobin: 8.2 g/dL — ABNORMAL LOW (ref 12.0–15.0)
Immature Granulocytes: 0 %
Lymphocytes Relative: 18 %
Lymphs Abs: 1.3 10*3/uL (ref 0.7–4.0)
MCH: 28 pg (ref 26.0–34.0)
MCHC: 32.2 g/dL (ref 30.0–36.0)
MCV: 87 fL (ref 80.0–100.0)
Monocytes Absolute: 0.7 10*3/uL (ref 0.1–1.0)
Monocytes Relative: 10 %
Neutro Abs: 4.6 10*3/uL (ref 1.7–7.7)
Neutrophils Relative %: 67 %
Platelets: 222 10*3/uL (ref 150–400)
RBC: 2.93 MIL/uL — ABNORMAL LOW (ref 3.87–5.11)
RDW: 14.2 % (ref 11.5–15.5)
WBC: 7 10*3/uL (ref 4.0–10.5)
nRBC: 0 % (ref 0.0–0.2)

## 2020-09-26 LAB — BRAIN NATRIURETIC PEPTIDE: B Natriuretic Peptide: 130.4 pg/mL — ABNORMAL HIGH (ref 0.0–100.0)

## 2020-09-26 LAB — BASIC METABOLIC PANEL
Anion gap: 8 (ref 5–15)
BUN: 37 mg/dL — ABNORMAL HIGH (ref 6–20)
CO2: 23 mmol/L (ref 22–32)
Calcium: 8.8 mg/dL — ABNORMAL LOW (ref 8.9–10.3)
Chloride: 106 mmol/L (ref 98–111)
Creatinine, Ser: 5.34 mg/dL — ABNORMAL HIGH (ref 0.44–1.00)
GFR, Estimated: 9 mL/min — ABNORMAL LOW (ref >60)
Glucose, Bld: 175 mg/dL — ABNORMAL HIGH (ref 70–99)
Potassium: 4.1 mmol/L (ref 3.5–5.1)
Sodium: 137 mmol/L (ref 135–145)

## 2020-09-26 LAB — RESP PANEL BY RT-PCR (FLU A&B, COVID) ARPGX2
Influenza A by PCR: NEGATIVE
Influenza B by PCR: NEGATIVE
SARS Coronavirus 2 by RT PCR: NEGATIVE

## 2020-09-26 MED ORDER — ACETAMINOPHEN 325 MG PO TABS
650.0000 mg | ORAL_TABLET | Freq: Four times a day (QID) | ORAL | Status: DC | PRN
  Administered 2020-09-26 – 2020-10-16 (×8): 650 mg via ORAL
  Filled 2020-09-26 (×6): qty 2

## 2020-09-26 MED ORDER — FUROSEMIDE 10 MG/ML IJ SOLN
80.0000 mg | Freq: Once | INTRAMUSCULAR | Status: AC
  Administered 2020-09-26: 80 mg via INTRAVENOUS
  Filled 2020-09-26: qty 8

## 2020-09-26 MED ORDER — FLUOXETINE HCL 20 MG PO CAPS
20.0000 mg | ORAL_CAPSULE | Freq: Every day | ORAL | Status: DC
  Administered 2020-09-27 – 2020-10-10 (×13): 20 mg via ORAL
  Filled 2020-09-26 (×13): qty 1

## 2020-09-26 MED ORDER — FAMOTIDINE 20 MG PO TABS
20.0000 mg | ORAL_TABLET | Freq: Every day | ORAL | Status: DC
  Administered 2020-09-27 – 2020-10-18 (×21): 20 mg via ORAL
  Filled 2020-09-26 (×21): qty 1

## 2020-09-26 MED ORDER — POLYETHYLENE GLYCOL 3350 17 G PO PACK
17.0000 g | PACK | Freq: Every day | ORAL | Status: DC | PRN
  Administered 2020-10-05: 17 g via ORAL
  Filled 2020-09-26: qty 1

## 2020-09-26 MED ORDER — CLOPIDOGREL BISULFATE 75 MG PO TABS
75.0000 mg | ORAL_TABLET | Freq: Every day | ORAL | Status: DC
  Administered 2020-09-27: 75 mg via ORAL
  Filled 2020-09-26: qty 1

## 2020-09-26 MED ORDER — SENNOSIDES-DOCUSATE SODIUM 8.6-50 MG PO TABS
1.0000 | ORAL_TABLET | Freq: Two times a day (BID) | ORAL | Status: DC | PRN
  Administered 2020-10-03 – 2020-10-09 (×2): 1 via ORAL
  Filled 2020-09-26 (×2): qty 1

## 2020-09-26 MED ORDER — ACETAMINOPHEN 650 MG RE SUPP: 650.0000 mg | Freq: Four times a day (QID) | RECTAL | Status: DC | PRN

## 2020-09-26 MED ORDER — HYDRALAZINE HCL 50 MG PO TABS
50.0000 mg | ORAL_TABLET | Freq: Three times a day (TID) | ORAL | Status: DC
  Administered 2020-09-27 – 2020-10-17 (×56): 50 mg via ORAL
  Filled 2020-09-26 (×39): qty 1
  Filled 2020-09-26: qty 2
  Filled 2020-09-26 (×18): qty 1

## 2020-09-26 MED ORDER — AMLODIPINE BESYLATE 10 MG PO TABS
10.0000 mg | ORAL_TABLET | Freq: Every day | ORAL | Status: DC
  Administered 2020-09-27 – 2020-10-18 (×16): 10 mg via ORAL
  Filled 2020-09-26 (×21): qty 1

## 2020-09-26 MED ORDER — SODIUM BICARBONATE 650 MG PO TABS
650.0000 mg | ORAL_TABLET | Freq: Three times a day (TID) | ORAL | Status: DC
  Administered 2020-09-27 – 2020-09-28 (×4): 650 mg via ORAL
  Filled 2020-09-26 (×4): qty 1

## 2020-09-26 MED ORDER — ROSUVASTATIN CALCIUM 5 MG PO TABS
10.0000 mg | ORAL_TABLET | Freq: Every day | ORAL | Status: DC
  Administered 2020-09-27 – 2020-10-18 (×21): 10 mg via ORAL
  Filled 2020-09-26 (×21): qty 2

## 2020-09-26 MED ORDER — FUROSEMIDE 10 MG/ML IJ SOLN
80.0000 mg | Freq: Every day | INTRAMUSCULAR | Status: DC
  Administered 2020-09-27 – 2020-09-29 (×3): 80 mg via INTRAVENOUS
  Filled 2020-09-26 (×3): qty 8

## 2020-09-26 MED ORDER — INSULIN ASPART 100 UNIT/ML IJ SOLN
0.0000 [IU] | Freq: Three times a day (TID) | INTRAMUSCULAR | Status: DC
  Administered 2020-09-27: 2 [IU] via SUBCUTANEOUS
  Administered 2020-09-27 – 2020-09-29 (×5): 1 [IU] via SUBCUTANEOUS
  Administered 2020-10-01 – 2020-10-03 (×3): 2 [IU] via SUBCUTANEOUS
  Administered 2020-10-04: 1 [IU] via SUBCUTANEOUS
  Administered 2020-10-05: 3 [IU] via SUBCUTANEOUS
  Administered 2020-10-07: 2 [IU] via SUBCUTANEOUS
  Administered 2020-10-07: 1 [IU] via SUBCUTANEOUS
  Administered 2020-10-07: 3 [IU] via SUBCUTANEOUS
  Administered 2020-10-08: 2 [IU] via SUBCUTANEOUS
  Administered 2020-10-08: 1 [IU] via SUBCUTANEOUS
  Administered 2020-10-09: 2 [IU] via SUBCUTANEOUS
  Administered 2020-10-09: 4 [IU] via SUBCUTANEOUS
  Administered 2020-10-09: 1 [IU] via SUBCUTANEOUS
  Administered 2020-10-10: 3 [IU] via SUBCUTANEOUS
  Filled 2020-09-26: qty 0.06

## 2020-09-26 MED ORDER — HEPARIN SODIUM (PORCINE) 5000 UNIT/ML IJ SOLN
5000.0000 [IU] | Freq: Three times a day (TID) | INTRAMUSCULAR | Status: DC
  Administered 2020-09-27 – 2020-09-28 (×6): 5000 [IU] via SUBCUTANEOUS
  Filled 2020-09-26 (×6): qty 1

## 2020-09-26 MED ORDER — SODIUM CHLORIDE 0.9% FLUSH
3.0000 mL | Freq: Two times a day (BID) | INTRAVENOUS | Status: DC
  Administered 2020-09-26 – 2020-10-18 (×37): 3 mL via INTRAVENOUS

## 2020-09-26 MED ORDER — MELATONIN 3 MG PO TABS
3.0000 mg | ORAL_TABLET | Freq: Every day | ORAL | Status: DC
  Administered 2020-09-27 – 2020-10-17 (×22): 3 mg via ORAL
  Filled 2020-09-26 (×22): qty 1

## 2020-09-26 NOTE — ED Triage Notes (Signed)
Patient was sent to the ED to r/o DVT. Patient has a history of strokes and renal failure

## 2020-09-26 NOTE — ED Provider Notes (Signed)
Emergency Medicine Provider Triage Evaluation Note  Hannah [name] , a 55 y.o. female  was evaluated in triage.  Pt complains of SHOB, right leg swelling x 2-3 days, sent by [facility] PCP. Recently in hospital with AKI. No history of PE/DVT.  Review of Systems  Positive: SHOB, leg swelling Negative: CP  Physical Exam  There were no vitals taken for this visit. Gen:   Awake, no distress   Resp:  Normal effort  MSK:   Moves extremities without difficulty  Other:  Swelling to right lower leg, no calf pain  Medical Decision Making  Medically screening exam initiated at 2:13 PM.  Appropriate orders placed.  Hannah [name] was informed that the remainder of the evaluation will be completed by another provider, this initial triage assessment does not replace that evaluation, and the importance of remaining in the ED until their evaluation is complete.     Tacy Learn, PA-C 09/26/20 1415    Valarie Merino, MD 09/27/20 2312

## 2020-09-26 NOTE — ED Provider Notes (Signed)
[facility] DEPT Provider Note   CSN: [id number] Arrival date & time: 09/26/20  1321     History Chief Complaint  Patient presents with  . Leg Swelling    Hannah [name] is a 55 y.o. female.  HPI      Hannah [name] is a 55 y.o. female, with a history of cerebral infarction, DM type II, hyperlipidemia, HTN, MS, CKD, presenting to the ED accompanied by her daughter/caretaker with shortness of breath over the last week.  Mild cough. Also notes lower extremity edema worsening over the past several days, much worse over the last 24 hours, especially in the right lower extremity. She was seen by her PCP in the office today and was sent to the ED for evaluation of the worsened right lower extremity swelling.  Patient was most recently admitted April 29 through May 4 due to acute kidney injury on CKD.  She was told to follow-up with nephrology and has an appointment upcoming on May 24. She does endorse poor oral intake due to lack of appetite over the past few days. Denies fever/chills, chest pain, abdominal pain, changes in urination, N/V/D, or any other complaints.    Past Medical History:  Diagnosis Date  . Cerebral infarction ([place]) 07/12/2017   2009 first one, has had a total of 4 stokes  . Diabetes type 2, controlled ([place])   . Hemiparesis affecting right side as late effect of cerebrovascular accident (CVA) ([place])   . Hyperlipidemia   . Hypertension   . Multiple sclerosis ([place]) 2009  . Stroke [facility])     Patient Active Problem List   Diagnosis Date Noted  . Volume overload 09/26/2020  . CKD (chronic kidney disease) stage 5, GFR less than 15 ml/min (HCC) 09/13/2020  . H/O: CVA (cerebrovascular accident) 09/12/2020  . Acute blood loss anemia   . Chronic kidney disease (CKD), stage IV (severe) ([place])   . Uncontrolled type 2 diabetes mellitus with hyperglycemia ([place])   . Essential hypertension   . Right pontine cerebrovascular accident ([place])  06/12/2020  . Stroke-like symptoms 06/09/2020  . Acute kidney injury superimposed on CKD ([place]) 06/09/2020  . Cerebral thrombosis with cerebral infarction 06/09/2020  . Hypertension   . Hyperlipidemia   . Diabetes type 2, controlled ([place])   . Bimalleolar ankle fracture 11/24/2018  . Multiple sclerosis ([place]) 2009    Past Surgical History:  Procedure Laterality Date  . APPENDECTOMY    . CYSTOSTOMY W/ BLADDER BIOPSY    . OVARIAN CYST SURGERY    . stent placed in heart       OB History   No obstetric history on file.     History reviewed. No pertinent family history.  Social History   Tobacco Use  . Smoking status: Never Smoker  . Smokeless tobacco: Never Used  Vaping Use  . Vaping Use: Never used  Substance Use Topics  . Alcohol use: Not Currently  . Drug use: Never    Home Medications Prior to Admission medications   Medication Sig Start Date End Date Taking? Authorizing Provider  acetaminophen (TYLENOL) 325 MG tablet Take 2 tablets (650 mg total) by mouth every 4 (four) hours as needed for mild pain (or temp > 37.5 C (99.5 F)). 06/25/20  Yes Angiulli, Lavon Paganini, PA-C  amLODipine (NORVASC) 10 MG tablet Take 1 tablet (10 mg total) by mouth daily. 06/25/20  Yes Angiulli, Lavon Paganini, PA-C  clopidogrel (PLAVIX) 75 MG tablet Take 1 tablet (75 mg total) by  mouth daily. 06/25/20  Yes Angiulli, Lavon Paganini, PA-C  diclofenac Sodium (VOLTAREN) 1 % GEL Apply 2 g topically 4 (four) times daily. Patient taking differently: Apply 2 g topically 4 (four) times daily as needed (pain). 06/25/20  Yes Angiulli, Lavon Paganini, PA-C  ergocalciferol (VITAMIN D2) 1.25 MG (50000 UT) capsule Take 50,000 Units by mouth once a week.   Yes [provider]  famotidine (PEPCID) 20 MG tablet Take 1 tablet (20 mg total) by mouth daily. 06/25/20  Yes Angiulli, Lavon Paganini, PA-C  FLUoxetine (PROZAC) 20 MG capsule Take 1 capsule (20 mg total) by mouth daily. 06/25/20  Yes Angiulli, Lavon Paganini, PA-C  glipiZIDE (GLUCOTROL) 10  MG tablet Take 1 tablet (10 mg total) by mouth 2 (two) times daily before a meal. 06/25/20  Yes Angiulli, Lavon Paganini, PA-C  hydrALAZINE (APRESOLINE) 50 MG tablet Take 1 tablet (50 mg total) by mouth every 8 (eight) hours. 09/17/20  Yes Mercy Riding, MD  insulin degludec (TRESIBA FLEXTOUCH) 100 UNIT/ML FlexTouch Pen Inject 15-45 Units into the skin at bedtime. Sliding scale   Yes [provider]  insulin lispro (INSULIN LISPRO) 100 UNIT/ML KwikPen Junior Inject 10 Units into the skin 3 (three) times daily.   Yes [provider]  melatonin 3 MG TABS tablet Take 1 tablet (3 mg total) by mouth at bedtime. 06/25/20  Yes Angiulli, Lavon Paganini, PA-C  nitroGLYCERIN (NITROSTAT) 0.4 MG SL tablet Place 1 tablet (0.4 mg total) under the tongue every 5 (five) minutes as needed for chest pain. 06/25/20  Yes Angiulli, Lavon Paganini, PA-C  rosuvastatin (CRESTOR) 20 MG tablet Take 1 tablet (20 mg total) by mouth daily. 06/25/20  Yes Angiulli, Lavon Paganini, PA-C  senna-docusate (SENOKOT-S) 8.6-50 MG tablet Take 1 tablet by mouth 2 (two) times daily as needed for moderate constipation. 09/17/20  Yes Mercy Riding, MD  sodium bicarbonate 650 MG tablet Take 1 tablet (650 mg total) by mouth 3 (three) times daily. 09/17/20  Yes Mercy Riding, MD  tiZANidine (ZANAFLEX) 2 MG tablet Take 1 tablet (2 mg total) by mouth 3 (three) times daily. 06/25/20  Yes Angiulli, Lavon Paganini, PA-C  carvedilol (COREG) 25 MG tablet Take 1 tablet (25 mg total) by mouth 2 (two) times daily with a meal. Patient not taking: No sig reported 09/17/20   Mercy Riding, MD    Allergies    Ibuprofen, Influenza vaccines, Pneumococcal vaccine, Promethazine, Ambien [zolpidem], Cyclobenzaprine, Tape, Tramadol hcl, and Oxycodone  Review of Systems   Review of Systems  Constitutional: Negative for chills, diaphoresis and fever.  Respiratory: Positive for shortness of breath.   Cardiovascular: Positive for leg swelling. Negative for chest pain.  Gastrointestinal:  Negative for abdominal pain, diarrhea, nausea and vomiting.  Musculoskeletal: Negative for back pain.  Neurological: Negative for dizziness, syncope, weakness and headaches.  All other systems reviewed and are negative.   Physical Exam Updated Vital Signs BP (!) 156/72   Pulse 92   Temp 98 F (36.7 C) (Oral)   Resp 19   Ht '5\' 2"'$  (1.575 m)   Wt 63.5 kg   SpO2 94%   BMI 25.61 kg/m   Physical Exam Vitals and nursing note reviewed.  Constitutional:      General: She is not in acute distress.    Appearance: She is well-developed. She is not diaphoretic.  HENT:     Head: Normocephalic and atraumatic.     Mouth/Throat:     Mouth: Mucous membranes are moist.  Pharynx: Oropharynx is clear.  Eyes:     Conjunctiva/sclera: Conjunctivae normal.  Cardiovascular:     Rate and Rhythm: Normal rate and regular rhythm.     Pulses: Normal pulses.          Radial pulses are 2+ on the right side and 2+ on the left side.       Posterior tibial pulses are 2+ on the right side and 2+ on the left side.     Heart sounds: Normal heart sounds.     Comments: Tactile temperature in the extremities appropriate and equal bilaterally. Pulmonary:     Effort: Pulmonary effort is normal. No respiratory distress.     Breath sounds: Normal breath sounds.  Abdominal:     Palpations: Abdomen is soft.     Tenderness: There is no abdominal tenderness. There is no guarding.  Musculoskeletal:     Cervical back: Neck supple.     Right lower leg: 1+ Pitting Edema present.     Left lower leg: 1+ Pitting Edema present.  Lymphadenopathy:     Cervical: No cervical adenopathy.  Skin:    General: Skin is warm and dry.  Neurological:     Mental Status: She is alert.  Psychiatric:        Mood and Affect: Mood and affect normal.        Speech: Speech normal.        Behavior: Behavior normal.     ED Results / Procedures / Treatments   Labs (all labs ordered are listed, but only abnormal results are  displayed) Labs Reviewed  BASIC METABOLIC PANEL - Abnormal; Notable for the following components:      Result Value   Glucose, Bld 175 (*)    BUN 37 (*)    Creatinine, Ser 5.34 (*)    Calcium 8.8 (*)    GFR, Estimated 9 (*)    All other components within normal limits  BRAIN NATRIURETIC PEPTIDE - Abnormal; Notable for the following components:   B Natriuretic Peptide 130.4 (*)    All other components within normal limits  CBC WITH DIFFERENTIAL/PLATELET - Abnormal; Notable for the following components:   RBC 2.93 (*)    Hemoglobin 8.2 (*)    HCT 25.5 (*)    All other components within normal limits  RESP PANEL BY RT-PCR (FLU A&B, COVID) ARPGX2  PHOSPHORUS  MAGNESIUM  CBC  RENAL FUNCTION PANEL   Hemoglobin  Date Value Ref Range Status  09/26/2020 8.2 (L) 12.0 - 15.0 g/dL Final  09/17/2020 7.9 (L) 12.0 - 15.0 g/dL Final  09/16/2020 8.6 (L) 12.0 - 15.0 g/dL Final  09/15/2020 8.6 (L) 12.0 - 15.0 g/dL Final   BUN  Date Value Ref Range Status  09/26/2020 37 (H) 6 - 20 mg/dL Final  09/17/2020 40 (H) 6 - 20 mg/dL Final  09/16/2020 40 (H) 6 - 20 mg/dL Final  09/15/2020 42 (H) 6 - 20 mg/dL Final   Creatinine, Ser  Date Value Ref Range Status  09/26/2020 5.34 (H) 0.44 - 1.00 mg/dL Final  09/17/2020 5.21 (H) 0.44 - 1.00 mg/dL Final  09/16/2020 4.71 (H) 0.44 - 1.00 mg/dL Final  09/15/2020 4.88 (H) 0.44 - 1.00 mg/dL Final     EKG None  Radiology DG Chest 2 View  Result Date: 09/26/2020 CLINICAL DATA:  Shortness of breath EXAM: CHEST - 2 VIEW COMPARISON:  None. FINDINGS: There is ill-defined airspace opacity in portions of the right middle and lower lobes. There is slight  left base atelectasis. Lungs elsewhere clear. Heart is upper normal in size with pulmonary vascularity normal. No adenopathy. No bone lesions. IMPRESSION: Ill-defined opacity consistent with pneumonia in portions of the right middle and lower lobes. Mild left base atelectasis. Lungs otherwise clear. Heart upper  normal in size. Electronically Signed   By: Lowella Grip III M.D.   On: 09/26/2020 15:06   VAS [place] LOWER EXTREMITY VENOUS (DVT) (ONLY [facility] & [facility] 7a-7p)  Result Date: 09/26/2020  Lower Venous DVT Study Patient Name:  LINDYN TOPALIAN  Date of Exam:   09/26/2020 Medical Rec #: [id number]     Accession #:    [id number] Date of Birth: December 06, 1965    Patient Gender: F Patient Age:   54Y Exam Location:  [facility] Procedure:      VAS [place] LOWER EXTREMITY VENOUS (DVT) Referring Phys: [id number] LAURA A MURPHY --------------------------------------------------------------------------------  Indications: Edema.  Comparison Study: No previous exams Performing Technologist: Rogelia Rohrer  Examination Guidelines: A complete evaluation includes B-mode imaging, spectral Doppler, color Doppler, and power Doppler as needed of all accessible portions of each vessel. Bilateral testing is considered an integral part of a complete examination. Limited examinations for reoccurring indications may be performed as noted. The reflux portion of the exam is performed with the patient in reverse Trendelenburg.  +---------+---------------+---------+-----------+----------+--------------+ RIGHT    CompressibilityPhasicitySpontaneityPropertiesThrombus Aging +---------+---------------+---------+-----------+----------+--------------+ CFV      Full           Yes      Yes                                 +---------+---------------+---------+-----------+----------+--------------+ SFJ      Full                                                        +---------+---------------+---------+-----------+----------+--------------+ FV Prox  Full           Yes      Yes                                 +---------+---------------+---------+-----------+----------+--------------+ FV Mid   Full           Yes      Yes                                 +---------+---------------+---------+-----------+----------+--------------+ FV  DistalFull           Yes      Yes                                 +---------+---------------+---------+-----------+----------+--------------+ PFV      Full                                                        +---------+---------------+---------+-----------+----------+--------------+ POP      Full           Yes  Yes                                 +---------+---------------+---------+-----------+----------+--------------+ PTV      Full                                                        +---------+---------------+---------+-----------+----------+--------------+ PERO     Full                                                        +---------+---------------+---------+-----------+----------+--------------+   +----+---------------+---------+-----------+----------+--------------+ LEFTCompressibilityPhasicitySpontaneityPropertiesThrombus Aging +----+---------------+---------+-----------+----------+--------------+ CFV Full           Yes      Yes                                 +----+---------------+---------+-----------+----------+--------------+     Summary: RIGHT: - There is no evidence of deep vein thrombosis in the lower extremity. - There is no evidence of superficial venous thrombosis.  - No cystic structure found in the popliteal fossa. Subcutaneous edema from mid thigh to ankle  LEFT: - No evidence of common femoral vein obstruction.  *See table(s) above for measurements and observations. Electronically signed by Servando Snare MD on 09/26/2020 at 4:55:05 PM.    Final     Procedures Procedures   Medications Ordered in ED Medications  furosemide (LASIX) injection 80 mg (has no administration in time range)  amLODipine (NORVASC) tablet 10 mg (has no administration in time range)  hydrALAZINE (APRESOLINE) tablet 50 mg (has no administration in time range)  rosuvastatin (CRESTOR) tablet 10 mg (has no administration in time range)  FLUoxetine (PROZAC)  capsule 20 mg (has no administration in time range)  famotidine (PEPCID) tablet 20 mg (has no administration in time range)  senna-docusate (Senokot-S) tablet 1 tablet (has no administration in time range)  sodium bicarbonate tablet 650 mg (has no administration in time range)  clopidogrel (PLAVIX) tablet 75 mg (has no administration in time range)  melatonin tablet 3 mg (has no administration in time range)  heparin injection 5,000 Units (has no administration in time range)  sodium chloride flush (NS) 0.9 % injection 3 mL (has no administration in time range)  acetaminophen (TYLENOL) tablet 650 mg (has no administration in time range)    Or  acetaminophen (TYLENOL) suppository 650 mg (has no administration in time range)  furosemide (LASIX) injection 80 mg (has no administration in time range)  polyethylene glycol (MIRALAX / GLYCOLAX) packet 17 g (has no administration in time range)    ED Course  I have reviewed the triage vital signs and the nursing notes.  Pertinent labs & imaging results that were available during my care of the patient were reviewed by me and considered in my medical decision making (see chart for details).  Clinical Course as of 09/26/20 2306  Fri Sep 26, 2020  2048 Spoke with Dr. Moshe Cipro, nephrologist.  We discussed the patient's symptoms, lab results, chest x-ray. She recommends admission to [facility].  Nephrology will see the patient in the  morning. She has more suspicion for edema on the chest x-ray rather than pneumonia.  If the patient has no leukocytosis, administer 80 mg IV Lasix. [SJ]  2222 Spoke with Dr. Trilby Drummer, hospitalist.  Agrees to admit the patient. [SJ]    Clinical Course User Index [SJ] Jalene Demo C, PA-C   MDM Rules/Calculators/A&P                           Patient presents with worsening lower extremity edema as well as shortness of breath. Patient is nontoxic appearing, afebrile, not tachycardic, not tachypneic, not hypotensive,  maintains excellent SPO2 on room air, and is in no apparent distress.   I have reviewed the patient's chart to obtain more information.   I reviewed and interpreted the patient's labs and radiological studies. Creatinine has risen since discharge May 4. Lower extremity edema and likely edema on chest x-ray indicates fluid overload. Patient admitted for further management.  Vitals:   09/26/20 2030 09/26/20 2045 09/26/20 2100 09/26/20 2200  BP: (!) 152/72 (!) 153/71 (!) 141/74 (!) 152/78  Pulse: 89 92 92 92  Resp: '17 14 18 17  '$ Temp:      TempSrc:      SpO2: 97% 98% 95% 99%  Weight:      Height:         Final Clinical Impression(s) / ED Diagnoses Final diagnoses:  Peripheral edema  Acute kidney injury superimposed on CKD [facility])    Rx / DC Orders ED Discharge Orders    None       Layla Maw 09/26/20 2317    Daleen Bo, MD 09/29/20 1556

## 2020-09-26 NOTE — H&P (Signed)
History and Physical   Hannah [name] [id number] DOB: [birth year] DOA: 09/26/2020  PCP: Vernie Shanks, MD   Patient coming from: Home  Chief Complaint: Shortness of breath and leg swelling  HPI: Hannah [name] is a 55 y.o. female with medical history significant of CKD 5, anemia, CVA, diabetes, hypertension, hyperlipidemia, multiple sclerosis who presents with shortness of breath and lower extremity swelling.  Reports 2 to 3 days of shortness of breath and will lower extremity swelling right greater than left.  Patient was sent to the ED after being evaluated by PCP.  She was recently admitted 2 weeks ago for AKI on CKD 4.  Ultrasound of the kidneys was unremarkable at that time and no other etiology was found so was believed it is progression of her CKD to CKD 5.  She had her blood pressure regimen adjusted at that time and was planning to follow-up with nephrology outpatient.  She reports on going headache. She denies fevers, chills, chest pain, abdominal pain, constipation, diarrhea, nausea, vomiting.   ED Course: Vital signs in the ED were stable.  Blood pressure in the [id number] systolic.  Lab work-up showed creatinine at 5.34 which is similar to 9 days ago.  BUN 37.  Calcium 8.9.  CBC with hemoglobin of 8.2 which is stable.  BNP mildly elevated to 130 from 77 2 weeks ago.  Respiratory panel for flu and COVID pending.  DVT study was negative for DVT.  Chest x-ray showed ill-defined opacity consistent with pneumonia in the right middle and lower lobes.  And some atelectasis in the left lower lobe.  Nephrology was consulted and due to concern of worsening CKD now with volume overload patient is likely approaching ESRD and will need diuresis and possible initiation on dialysis.  Review of Systems: As per HPI otherwise all other systems reviewed and are negative.  Past Medical History:  Diagnosis Date  . Cerebral infarction ([place]) 07/12/2017   2009 first one, has had a total of 4 stokes  . Diabetes  type 2, controlled ([place])   . Hemiparesis affecting right side as late effect of cerebrovascular accident (CVA) ([place])   . Hyperlipidemia   . Hypertension   . Multiple sclerosis ([place]) 2009  . Stroke [facility])     Past Surgical History:  Procedure Laterality Date  . APPENDECTOMY    . CYSTOSTOMY W/ BLADDER BIOPSY    . OVARIAN CYST SURGERY    . stent placed in heart      Social History  reports that she has never smoked. She has never used smokeless tobacco. She reports previous alcohol use. She reports that she does not use drugs.  Allergies  Allergen Reactions  . Ibuprofen Other (See Comments) and Swelling    Edema Swelling to hands, feet and face   . Influenza Vaccines Anaphylaxis    Swelling, fever  . Pneumococcal Vaccine Anaphylaxis    Swelling, fever Swelling, fever   . Promethazine Other (See Comments)    hallucinations hallucinations   . Ambien [Zolpidem]     Other reaction(s): Unknown  . Cyclobenzaprine Other (See Comments)    hallucinations   . Tape Itching  . Tramadol Hcl     Other reaction(s): hallucinations  . Oxycodone Nausea Only and Nausea And Vomiting    History reviewed. No pertinent family history.   Prior to Admission medications   Medication Sig Start Date End Date Taking? Authorizing Provider  acetaminophen (TYLENOL) 325 MG tablet Take 2 tablets (650 mg total) by mouth every  4 (four) hours as needed for mild pain (or temp > 37.5 C (99.5 F)). 06/25/20  Yes Angiulli, Lavon Paganini, PA-C  amLODipine (NORVASC) 10 MG tablet Take 1 tablet (10 mg total) by mouth daily. 06/25/20  Yes Angiulli, Lavon Paganini, PA-C  clopidogrel (PLAVIX) 75 MG tablet Take 1 tablet (75 mg total) by mouth daily. 06/25/20  Yes Angiulli, Lavon Paganini, PA-C  diclofenac Sodium (VOLTAREN) 1 % GEL Apply 2 g topically 4 (four) times daily. Patient taking differently: Apply 2 g topically 4 (four) times daily as needed (pain). 06/25/20  Yes Angiulli, Lavon Paganini, PA-C  ergocalciferol (VITAMIN D2) 1.25 MG (50000  UT) capsule Take 50,000 Units by mouth once a week.   Yes [provider]  famotidine (PEPCID) 20 MG tablet Take 1 tablet (20 mg total) by mouth daily. 06/25/20  Yes Angiulli, Lavon Paganini, PA-C  FLUoxetine (PROZAC) 20 MG capsule Take 1 capsule (20 mg total) by mouth daily. 06/25/20  Yes Angiulli, Lavon Paganini, PA-C  glipiZIDE (GLUCOTROL) 10 MG tablet Take 1 tablet (10 mg total) by mouth 2 (two) times daily before a meal. 06/25/20  Yes Angiulli, Lavon Paganini, PA-C  hydrALAZINE (APRESOLINE) 50 MG tablet Take 1 tablet (50 mg total) by mouth every 8 (eight) hours. 09/17/20  Yes Mercy Riding, MD  insulin degludec (TRESIBA FLEXTOUCH) 100 UNIT/ML FlexTouch Pen Inject 15-45 Units into the skin at bedtime. Sliding scale   Yes [provider]  carvedilol (COREG) 25 MG tablet Take 1 tablet (25 mg total) by mouth 2 (two) times daily with a meal. Patient not taking: Reported on 09/26/2020 09/17/20   Mercy Riding, MD  insulin lispro (INSULIN LISPRO) 100 UNIT/ML KwikPen Junior Inject 10 Units into the skin 3 (three) times daily.    [provider]  melatonin 3 MG TABS tablet Take 1 tablet (3 mg total) by mouth at bedtime. 06/25/20   Angiulli, Lavon Paganini, PA-C  nitroGLYCERIN (NITROSTAT) 0.4 MG SL tablet Place 1 tablet (0.4 mg total) under the tongue every 5 (five) minutes as needed for chest pain. 06/25/20   Angiulli, Lavon Paganini, PA-C  rosuvastatin (CRESTOR) 20 MG tablet Take 1 tablet (20 mg total) by mouth daily. Patient taking differently: Take 10 mg by mouth daily. 06/25/20   Angiulli, Lavon Paganini, PA-C  senna-docusate (SENOKOT-S) 8.6-50 MG tablet Take 1 tablet by mouth 2 (two) times daily as needed for moderate constipation. 09/17/20   Mercy Riding, MD  sodium bicarbonate 650 MG tablet Take 1 tablet (650 mg total) by mouth 3 (three) times daily. 09/17/20   Mercy Riding, MD  tiZANidine (ZANAFLEX) 2 MG tablet Take 1 tablet (2 mg total) by mouth 3 (three) times daily. 06/25/20   Cathlyn Parsons, PA-C    Physical  Exam: Vitals:   09/26/20 2030 09/26/20 2045 09/26/20 2100 09/26/20 2200  BP: (!) 152/72 (!) 153/71 (!) 141/74 (!) 152/78  Pulse: 89 92 92 92  Resp: '17 14 18 17  '$ Temp:      TempSrc:      SpO2: 97% 98% 95% 99%  Weight:      Height:       Physical Exam Constitutional:      General: She is not in acute distress.    Appearance: Normal appearance.  HENT:     Head: Normocephalic and atraumatic.     Mouth/Throat:     Mouth: Mucous membranes are moist.     Pharynx: Oropharynx is clear.  Eyes:  Extraocular Movements: Extraocular movements intact.     Pupils: Pupils are equal, round, and reactive to light.  Cardiovascular:     Rate and Rhythm: Normal rate and regular rhythm.     Pulses: Normal pulses.     Heart sounds: Normal heart sounds.  Pulmonary:     Effort: Pulmonary effort is normal. No respiratory distress.     Breath sounds: Normal breath sounds.  Abdominal:     General: Bowel sounds are normal. There is no distension.     Palpations: Abdomen is soft.     Tenderness: There is no abdominal tenderness.  Musculoskeletal:        General: No swelling or deformity.     Right lower leg: Edema present.     Left lower leg: Edema present.  Skin:    General: Skin is warm and dry.  Neurological:     General: No focal deficit present.     Mental Status: Mental status is at baseline.     Labs on Admission: I have personally reviewed following labs and imaging studies  CBC: Recent Labs  Lab 09/26/20 2127  WBC 7.0  NEUTROABS 4.6  HGB 8.2*  HCT 25.5*  MCV 87.0  PLT [id number]    Basic Metabolic Panel: Recent Labs  Lab 09/26/20 1731  NA 137  K 4.1  CL 106  CO2 23  GLUCOSE 175*  BUN 37*  CREATININE 5.34*  CALCIUM 8.8*    GFR: Estimated Creatinine Clearance: 10.3 mL/min (A) (by C-G formula based on SCr of 5.34 mg/dL (H)).  Liver Function Tests: No results for input(s): AST, ALT, ALKPHOS, BILITOT, PROT, ALBUMIN in the last 168 hours.  Urine analysis:     Component Value Date/Time   COLORURINE STRAW (A) 09/12/2020 2208   APPEARANCEUR CLEAR 09/12/2020 2208   LABSPEC 1.007 09/12/2020 2208   PHURINE 6.0 09/12/2020 2208   GLUCOSEU >=500 (A) 09/12/2020 2208   HGBUR NEGATIVE 09/12/2020 2208   [place] 09/12/2020 2208   KETONESUR NEGATIVE 09/12/2020 2208   PROTEINUR >=300 (A) 09/12/2020 2208   NITRITE NEGATIVE 09/12/2020 2208   LEUKOCYTESUR NEGATIVE 09/12/2020 2208    Radiological Exams on Admission: DG Chest 2 View  Result Date: 09/26/2020 CLINICAL DATA:  Shortness of breath EXAM: CHEST - 2 VIEW COMPARISON:  None. FINDINGS: There is ill-defined airspace opacity in portions of the right middle and lower lobes. There is slight left base atelectasis. Lungs elsewhere clear. Heart is upper normal in size with pulmonary vascularity normal. No adenopathy. No bone lesions. IMPRESSION: Ill-defined opacity consistent with pneumonia in portions of the right middle and lower lobes. Mild left base atelectasis. Lungs otherwise clear. Heart upper normal in size. Electronically Signed   By: Lowella Grip III M.D.   On: 09/26/2020 15:06   VAS [place] LOWER EXTREMITY VENOUS (DVT) (ONLY [facility] & [facility] 7a-7p)  Result Date: 09/26/2020  Lower Venous DVT Study Patient Name:  Hannah [name]  Date of Exam:   09/26/2020 Medical Rec #: [id number]     Accession #:    [id number] Date of Birth: 09-10-1965    Patient Gender: F Patient Age:   54Y Exam Location:  [facility] Procedure:      VAS [place] LOWER EXTREMITY VENOUS (DVT) Referring Phys: [id number] LAURA A MURPHY --------------------------------------------------------------------------------  Indications: Edema.  Comparison Study: No previous exams Performing Technologist: Rogelia Rohrer  Examination Guidelines: A complete evaluation includes B-mode imaging, spectral Doppler, color Doppler, and power Doppler as needed of all accessible portions of each  vessel. Bilateral testing is considered an integral part of a complete  examination. Limited examinations for reoccurring indications may be performed as noted. The reflux portion of the exam is performed with the patient in reverse Trendelenburg.  +---------+---------------+---------+-----------+----------+--------------+ RIGHT    CompressibilityPhasicitySpontaneityPropertiesThrombus Aging +---------+---------------+---------+-----------+----------+--------------+ CFV      Full           Yes      Yes                                 +---------+---------------+---------+-----------+----------+--------------+ SFJ      Full                                                        +---------+---------------+---------+-----------+----------+--------------+ FV Prox  Full           Yes      Yes                                 +---------+---------------+---------+-----------+----------+--------------+ FV Mid   Full           Yes      Yes                                 +---------+---------------+---------+-----------+----------+--------------+ FV DistalFull           Yes      Yes                                 +---------+---------------+---------+-----------+----------+--------------+ PFV      Full                                                        +---------+---------------+---------+-----------+----------+--------------+ POP      Full           Yes      Yes                                 +---------+---------------+---------+-----------+----------+--------------+ PTV      Full                                                        +---------+---------------+---------+-----------+----------+--------------+ PERO     Full                                                        +---------+---------------+---------+-----------+----------+--------------+   +----+---------------+---------+-----------+----------+--------------+ LEFTCompressibilityPhasicitySpontaneityPropertiesThrombus Aging  +----+---------------+---------+-----------+----------+--------------+ CFV Full           Yes      Yes                                 +----+---------------+---------+-----------+----------+--------------+  Summary: RIGHT: - There is no evidence of deep vein thrombosis in the lower extremity. - There is no evidence of superficial venous thrombosis.  - No cystic structure found in the popliteal fossa. Subcutaneous edema from mid thigh to ankle  LEFT: - No evidence of common femoral vein obstruction.  *See table(s) above for measurements and observations. Electronically signed by Servando Snare MD on 09/26/2020 at 4:55:05 PM.    Final    EKG: Independently reviewed.  Normal sinus rhythm at 83 bpm.  Assessment/Plan Principal Problem:   Volume overload Active Problems:   Multiple sclerosis (HCC)   Hyperlipidemia   Uncontrolled type 2 diabetes mellitus with hyperglycemia (HCC)   Essential hypertension   H/O: CVA (cerebrovascular accident)   CKD (chronic kidney disease) stage 5, GFR less than 15 ml/min (HCC)   Volume overload CKD 5 > Patient presenting with volume overload with lower extremity edema.  Chest x-ray read as consistent with pneumonia and atelectasis but may also be compatible with volume overload.  Creatinine remained stable at 5.34. > Nephrology consulted in ED patient will be admitted to [facility] as she may have progressed to end-stage renal disease given her new volume retention and may be time for her to be initiated on dialysis.  We will start with diuresis. - Appreciate nephrology recommendations (they will see her in the morning) - Admit to [facility] - Continue with 80 mg IV Lasix daily - Continue with bicarb - Renal diet with fluid restriction - Trend renal function and electrolytes  Anemia > Likely related to chronic kidney disease.  Hemoglobin stable at 8.2. - Trend CBC  History of CVA Hyperlipidemia - Continue home aspirin, Plavix,  statin  Hypertension - Continue Coreg and hydralazine  Diabetes > On sliding scale long-acting at home. But state she has recently been using on 10U and had some low glucose with this.  Also 10 units with meals. - We will start on SSI only considering recent lows and worsening renal function   DVT prophylaxis: Heparin  Code Status:   Full  Family Communication:  Daughter updated at bedside  Disposition Plan:   Patient is from:  Home  Anticipated DC to:  Home  Anticipated DC date:  1 to 4 days, pending clinical course and nephrology work-up  Anticipated DC barriers: If patient needs to be started on dialysis, will need to have center arranged before discharge most likely.  Consults called:  Nephrology, Dr. Moshe Cipro, consulted by EDP  Admission status:  Observation, telemetry   Severity of Illness: The appropriate patient status for this patient is OBSERVATION. Observation status is judged to be reasonable and necessary in order to provide the required intensity of service to ensure the patient's safety. The patient's presenting symptoms, physical exam findings, and initial radiographic and laboratory data in the context of their medical condition is felt to place them at decreased risk for further clinical deterioration. Furthermore, it is anticipated that the patient will be medically stable for discharge from the hospital within 2 midnights of admission. The following factors support the patient status of observation.   " The patient's presenting symptoms include shortness of breath, edema. " The physical exam findings include edema. " The initial radiographic and laboratory data are DVT study negative for lower extremity DVT.  Chest x-ray ill-defined opacities concerning for pneumonia and atelectasis.  Creatinine 5.34, BNP 130, hemoglobin 8.2.   Marcelyn Bruins MD [facility]  How to contact the [facility] Attending or Consulting provider 7A -  7P or covering provider during  after hours [place], for this patient?   1. Check the care team in [facility] and look for a) attending/consulting TR[facility] provider listed and b) the [facility] team listed 2. Log into [email or web address] and use [place]'s universal password to access. If you do not have the password, please contact the hospital operator. 3. Locate the [facility] provider you are looking for under [facility] and page to a number that you can be directly reached. 4. If you still have difficulty reaching the provider, please page the [facility] (Director on Call) for the Hospitalists listed on amion for assistance.  09/26/2020, 10:46 PM

## 2020-09-26 NOTE — Progress Notes (Signed)
RLE venous duplex has been completed.  Suella Broad, PA given preliminary results.  Results can be found under chart review under CV PROC. 09/26/2020 4:37 PM Emmalie Haigh RVT, RDMS

## 2020-09-27 ENCOUNTER — Inpatient Hospital Stay (HOSPITAL_COMMUNITY): Payer: Medicare Other

## 2020-09-27 DIAGNOSIS — Z885 Allergy status to narcotic agent status: Secondary | ICD-10-CM | POA: Diagnosis not present

## 2020-09-27 DIAGNOSIS — E8889 Other specified metabolic disorders: Secondary | ICD-10-CM | POA: Diagnosis not present

## 2020-09-27 DIAGNOSIS — N189 Chronic kidney disease, unspecified: Secondary | ICD-10-CM | POA: Diagnosis not present

## 2020-09-27 DIAGNOSIS — D62 Acute posthemorrhagic anemia: Secondary | ICD-10-CM | POA: Diagnosis not present

## 2020-09-27 DIAGNOSIS — Z886 Allergy status to analgesic agent status: Secondary | ICD-10-CM | POA: Diagnosis not present

## 2020-09-27 DIAGNOSIS — N2581 Secondary hyperparathyroidism of renal origin: Secondary | ICD-10-CM | POA: Diagnosis present

## 2020-09-27 DIAGNOSIS — Z992 Dependence on renal dialysis: Secondary | ICD-10-CM | POA: Diagnosis not present

## 2020-09-27 DIAGNOSIS — E11649 Type 2 diabetes mellitus with hypoglycemia without coma: Secondary | ICD-10-CM | POA: Diagnosis not present

## 2020-09-27 DIAGNOSIS — N179 Acute kidney failure, unspecified: Secondary | ICD-10-CM | POA: Diagnosis not present

## 2020-09-27 DIAGNOSIS — D509 Iron deficiency anemia, unspecified: Secondary | ICD-10-CM | POA: Diagnosis present

## 2020-09-27 DIAGNOSIS — Z7902 Long term (current) use of antithrombotics/antiplatelets: Secondary | ICD-10-CM | POA: Diagnosis not present

## 2020-09-27 DIAGNOSIS — E872 Acidosis: Secondary | ICD-10-CM | POA: Diagnosis not present

## 2020-09-27 DIAGNOSIS — I69351 Hemiplegia and hemiparesis following cerebral infarction affecting right dominant side: Secondary | ICD-10-CM | POA: Diagnosis not present

## 2020-09-27 DIAGNOSIS — I120 Hypertensive chronic kidney disease with stage 5 chronic kidney disease or end stage renal disease: Secondary | ICD-10-CM | POA: Diagnosis not present

## 2020-09-27 DIAGNOSIS — I313 Pericardial effusion (noninflammatory): Secondary | ICD-10-CM

## 2020-09-27 DIAGNOSIS — Z20822 Contact with and (suspected) exposure to covid-19: Secondary | ICD-10-CM | POA: Diagnosis not present

## 2020-09-27 DIAGNOSIS — D631 Anemia in chronic kidney disease: Secondary | ICD-10-CM | POA: Diagnosis not present

## 2020-09-27 DIAGNOSIS — N185 Chronic kidney disease, stage 5: Secondary | ICD-10-CM | POA: Diagnosis not present

## 2020-09-27 DIAGNOSIS — R609 Edema, unspecified: Secondary | ICD-10-CM | POA: Diagnosis present

## 2020-09-27 DIAGNOSIS — E8770 Fluid overload, unspecified: Secondary | ICD-10-CM | POA: Diagnosis not present

## 2020-09-27 DIAGNOSIS — E871 Hypo-osmolality and hyponatremia: Secondary | ICD-10-CM | POA: Diagnosis not present

## 2020-09-27 DIAGNOSIS — I953 Hypotension of hemodialysis: Secondary | ICD-10-CM | POA: Diagnosis not present

## 2020-09-27 DIAGNOSIS — R0602 Shortness of breath: Secondary | ICD-10-CM | POA: Diagnosis present

## 2020-09-27 DIAGNOSIS — N186 End stage renal disease: Secondary | ICD-10-CM | POA: Diagnosis not present

## 2020-09-27 DIAGNOSIS — Z79899 Other long term (current) drug therapy: Secondary | ICD-10-CM | POA: Diagnosis not present

## 2020-09-27 DIAGNOSIS — R5381 Other malaise: Secondary | ICD-10-CM | POA: Diagnosis not present

## 2020-09-27 DIAGNOSIS — Z7984 Long term (current) use of oral hypoglycemic drugs: Secondary | ICD-10-CM | POA: Diagnosis not present

## 2020-09-27 DIAGNOSIS — N19 Unspecified kidney failure: Secondary | ICD-10-CM | POA: Diagnosis not present

## 2020-09-27 DIAGNOSIS — G35 Multiple sclerosis: Secondary | ICD-10-CM | POA: Diagnosis not present

## 2020-09-27 DIAGNOSIS — E1165 Type 2 diabetes mellitus with hyperglycemia: Secondary | ICD-10-CM | POA: Diagnosis present

## 2020-09-27 DIAGNOSIS — E8779 Other fluid overload: Secondary | ICD-10-CM | POA: Diagnosis not present

## 2020-09-27 DIAGNOSIS — E1122 Type 2 diabetes mellitus with diabetic chronic kidney disease: Secondary | ICD-10-CM | POA: Diagnosis not present

## 2020-09-27 DIAGNOSIS — E785 Hyperlipidemia, unspecified: Secondary | ICD-10-CM | POA: Diagnosis present

## 2020-09-27 DIAGNOSIS — Z794 Long term (current) use of insulin: Secondary | ICD-10-CM | POA: Diagnosis not present

## 2020-09-27 DIAGNOSIS — I10 Essential (primary) hypertension: Secondary | ICD-10-CM | POA: Diagnosis not present

## 2020-09-27 DIAGNOSIS — Z8673 Personal history of transient ischemic attack (TIA), and cerebral infarction without residual deficits: Secondary | ICD-10-CM | POA: Diagnosis not present

## 2020-09-27 LAB — ECHOCARDIOGRAM COMPLETE
AR max vel: 2.06 cm2
AV Area VTI: 1.96 cm2
AV Area mean vel: 1.99 cm2
AV Mean grad: 8 mmHg
AV Peak grad: 13.8 mmHg
Ao pk vel: 1.86 m/s
Height: 62 in
S' Lateral: 2.3 cm
Weight: 2240 oz

## 2020-09-27 LAB — CBC
HCT: 24.9 % — ABNORMAL LOW (ref 36.0–46.0)
Hemoglobin: 8 g/dL — ABNORMAL LOW (ref 12.0–15.0)
MCH: 27.9 pg (ref 26.0–34.0)
MCHC: 32.1 g/dL (ref 30.0–36.0)
MCV: 86.8 fL (ref 80.0–100.0)
Platelets: 213 10*3/uL (ref 150–400)
RBC: 2.87 MIL/uL — ABNORMAL LOW (ref 3.87–5.11)
RDW: 14.1 % (ref 11.5–15.5)
WBC: 6.3 10*3/uL (ref 4.0–10.5)
nRBC: 0 % (ref 0.0–0.2)

## 2020-09-27 LAB — RENAL FUNCTION PANEL
Albumin: 2.7 g/dL — ABNORMAL LOW (ref 3.5–5.0)
Anion gap: 10 (ref 5–15)
BUN: 32 mg/dL — ABNORMAL HIGH (ref 6–20)
CO2: 22 mmol/L (ref 22–32)
Calcium: 8.7 mg/dL — ABNORMAL LOW (ref 8.9–10.3)
Chloride: 106 mmol/L (ref 98–111)
Creatinine, Ser: 5.45 mg/dL — ABNORMAL HIGH (ref 0.44–1.00)
GFR, Estimated: 9 mL/min — ABNORMAL LOW (ref >60)
Glucose, Bld: 116 mg/dL — ABNORMAL HIGH (ref 70–99)
Phosphorus: 4.4 mg/dL (ref 2.5–4.6)
Potassium: 3.8 mmol/L (ref 3.5–5.1)
Sodium: 138 mmol/L (ref 135–145)

## 2020-09-27 LAB — GLUCOSE, CAPILLARY
Glucose-Capillary: 117 mg/dL — ABNORMAL HIGH (ref 70–99)
Glucose-Capillary: 222 mg/dL — ABNORMAL HIGH (ref 70–99)
Glucose-Capillary: 175 mg/dL — ABNORMAL HIGH (ref 70–99)
Glucose-Capillary: 200 mg/dL — ABNORMAL HIGH (ref 70–99)

## 2020-09-27 LAB — MAGNESIUM: Magnesium: 2.1 mg/dL (ref 1.7–2.4)

## 2020-09-27 LAB — PHOSPHORUS: Phosphorus: 4.2 mg/dL (ref 2.5–4.6)

## 2020-09-27 MED ORDER — ONDANSETRON HCL 4 MG/2ML IJ SOLN
4.0000 mg | Freq: Four times a day (QID) | INTRAMUSCULAR | Status: DC | PRN
  Administered 2020-09-27 – 2020-10-05 (×9): 4 mg via INTRAVENOUS
  Filled 2020-09-27 (×9): qty 2

## 2020-09-27 MED ORDER — SODIUM CHLORIDE 0.9 % IV SOLN
1.0000 g | INTRAVENOUS | Status: AC
  Administered 2020-09-29: 1 g via INTRAVENOUS

## 2020-09-27 NOTE — Progress Notes (Signed)
Patient is requesting nausea medicine.  Paged on call MD.  Received order for Zofran 4 mg iv.

## 2020-09-27 NOTE — Progress Notes (Signed)
Patient placed on cardiac monitoring per orders.

## 2020-09-27 NOTE — ED Notes (Signed)
[facility] at bedside. 5N called and made aware of patient's impending arrival.

## 2020-09-27 NOTE — Consult Note (Signed)
Chief Complaint: Patient was seen in consultation today for  Chief Complaint  Patient presents with  . Leg Swelling    Referring Physician(s): Dr. Moshe Cipro  Supervising Physician: Arne Cleveland  Patient Status: [facility] - In-pt  History of Present Illness: Hannah [name] is a 55 y.o. female with a medical history significant for DM, HTN, CVA and multiple sclerosis. She has had chronic and progressive CKD and is followed by Dr. Joelyn Oms (nephrology). She presented to the hospital 09/26/20 due to edema and decreased energy levels and was found to have increased creatinine levels and low albumin. She has been evaluated by the nephrology team and there are now plans to start hemodialysis on Monday, 09/29/20.  Interventional Radiology has been asked to evaluate this patient for a tunneled dialysis catheter to facilitate her treatment plans.   Past Medical History:  Diagnosis Date  . Cerebral infarction ([place]) 07/12/2017   2009 first one, has had a total of 4 stokes  . Diabetes type 2, controlled ([place])   . Hemiparesis affecting right side as late effect of cerebrovascular accident (CVA) ([place])   . Hyperlipidemia   . Hypertension   . Multiple sclerosis ([place]) 2009  . Stroke [facility])     Past Surgical History:  Procedure Laterality Date  . APPENDECTOMY    . CYSTOSTOMY W/ BLADDER BIOPSY    . OVARIAN CYST SURGERY    . stent placed in heart      Allergies: Ibuprofen, Influenza vaccines, Pneumococcal vaccine, Promethazine, Ambien [zolpidem], Cyclobenzaprine, Tape, Tramadol hcl, and Oxycodone  Medications: Prior to Admission medications   Medication Sig Start Date End Date Taking? Authorizing Provider  acetaminophen (TYLENOL) 325 MG tablet Take 2 tablets (650 mg total) by mouth every 4 (four) hours as needed for mild pain (or temp > 37.5 C (99.5 F)). 06/25/20  Yes Angiulli, Lavon Paganini, PA-C  amLODipine (NORVASC) 10 MG tablet Take 1 tablet (10 mg total) by mouth daily. 06/25/20  Yes Angiulli,  Lavon Paganini, PA-C  clopidogrel (PLAVIX) 75 MG tablet Take 1 tablet (75 mg total) by mouth daily. 06/25/20  Yes Angiulli, Lavon Paganini, PA-C  diclofenac Sodium (VOLTAREN) 1 % GEL Apply 2 g topically 4 (four) times daily. Patient taking differently: Apply 2 g topically 4 (four) times daily as needed (pain). 06/25/20  Yes Angiulli, Lavon Paganini, PA-C  ergocalciferol (VITAMIN D2) 1.25 MG (50000 UT) capsule Take 50,000 Units by mouth once a week.   Yes [provider]  famotidine (PEPCID) 20 MG tablet Take 1 tablet (20 mg total) by mouth daily. 06/25/20  Yes Angiulli, Lavon Paganini, PA-C  FLUoxetine (PROZAC) 20 MG capsule Take 1 capsule (20 mg total) by mouth daily. 06/25/20  Yes Angiulli, Lavon Paganini, PA-C  glipiZIDE (GLUCOTROL) 10 MG tablet Take 1 tablet (10 mg total) by mouth 2 (two) times daily before a meal. 06/25/20  Yes Angiulli, Lavon Paganini, PA-C  hydrALAZINE (APRESOLINE) 50 MG tablet Take 1 tablet (50 mg total) by mouth every 8 (eight) hours. 09/17/20  Yes Mercy Riding, MD  insulin degludec (TRESIBA FLEXTOUCH) 100 UNIT/ML FlexTouch Pen Inject 15-45 Units into the skin at bedtime. Sliding scale   Yes [provider]  insulin lispro (INSULIN LISPRO) 100 UNIT/ML KwikPen Junior Inject 10 Units into the skin 3 (three) times daily.   Yes [provider]  melatonin 3 MG TABS tablet Take 1 tablet (3 mg total) by mouth at bedtime. 06/25/20  Yes Angiulli, Lavon Paganini, PA-C  nitroGLYCERIN (NITROSTAT) 0.4 MG SL tablet Place  1 tablet (0.4 mg total) under the tongue every 5 (five) minutes as needed for chest pain. 06/25/20  Yes Angiulli, Lavon Paganini, PA-C  rosuvastatin (CRESTOR) 20 MG tablet Take 1 tablet (20 mg total) by mouth daily. 06/25/20  Yes Angiulli, Lavon Paganini, PA-C  senna-docusate (SENOKOT-S) 8.6-50 MG tablet Take 1 tablet by mouth 2 (two) times daily as needed for moderate constipation. 09/17/20  Yes Mercy Riding, MD  sodium bicarbonate 650 MG tablet Take 1 tablet (650 mg total) by mouth 3 (three) times daily. 09/17/20   Yes Mercy Riding, MD  tiZANidine (ZANAFLEX) 2 MG tablet Take 1 tablet (2 mg total) by mouth 3 (three) times daily. 06/25/20  Yes Angiulli, Lavon Paganini, PA-C  carvedilol (COREG) 25 MG tablet Take 1 tablet (25 mg total) by mouth 2 (two) times daily with a meal. Patient not taking: No sig reported 09/17/20   Mercy Riding, MD     History reviewed. No pertinent family history.  Social History   Socioeconomic History  . Marital status: Divorced    Spouse name: Not on file  . Number of children: Not on file  . Years of education: Not on file  . Highest education level: Not on file  Occupational History  . Occupation: disabled  Tobacco Use  . Smoking status: Never Smoker  . Smokeless tobacco: Never Used  Vaping Use  . Vaping Use: Never used  Substance and Sexual Activity  . Alcohol use: Not Currently  . Drug use: Never  . Sexual activity: Not on file  Other Topics Concern  . Not on file  Social History Narrative  . Not on file   Social Determinants of Health   Financial Resource Strain: Not on file  Food Insecurity: Not on file  Transportation Needs: Not on file  Physical Activity: Not on file  Stress: Not on file  Social Connections: Not on file    Review of Systems: A 12 point ROS discussed and pertinent positives are indicated in the HPI above.  All other systems are negative.  Review of Systems  Constitutional: Positive for appetite change and fatigue.  Respiratory: Positive for shortness of breath. Negative for cough.   Cardiovascular: Positive for leg swelling.  Gastrointestinal: Positive for nausea. Negative for abdominal pain, diarrhea and vomiting.  Neurological: Positive for dizziness and headaches.    Vital Signs: BP (!) 143/62 (BP Location: Left Arm)   Pulse 92   Temp 98.5 F (36.9 C) (Oral)   Resp 18   Ht '5\' 2"'$  (1.575 m)   Wt 140 lb (63.5 kg)   SpO2 100%   BMI 25.61 kg/m   Physical Exam Constitutional:      General: She is not in acute  distress. HENT:     Mouth/Throat:     Mouth: Mucous membranes are moist.     Pharynx: Oropharynx is clear.  Cardiovascular:     Rate and Rhythm: Normal rate and regular rhythm.     Pulses: Normal pulses.     Heart sounds: Normal heart sounds.  Pulmonary:     Effort: Pulmonary effort is normal.     Breath sounds: Normal breath sounds.  Abdominal:     General: Bowel sounds are normal.     Palpations: Abdomen is soft.  Musculoskeletal:     Right lower leg: No edema.     Left lower leg: No edema.  Skin:    General: Skin is warm and dry.  Neurological:     Mental  Status: She is alert and oriented to person, place, and time.     Comments: Able to answer questions appropriately but some evidence of memory impairment/lack of understanding     Imaging: DG Chest 2 View  Result Date: 09/26/2020 CLINICAL DATA:  Shortness of breath EXAM: CHEST - 2 VIEW COMPARISON:  None. FINDINGS: There is ill-defined airspace opacity in portions of the right middle and lower lobes. There is slight left base atelectasis. Lungs elsewhere clear. Heart is upper normal in size with pulmonary vascularity normal. No adenopathy. No bone lesions. IMPRESSION: Ill-defined opacity consistent with pneumonia in portions of the right middle and lower lobes. Mild left base atelectasis. Lungs otherwise clear. Heart upper normal in size. Electronically Signed   By: Lowella Grip III M.D.   On: 09/26/2020 15:06   DG Abd 1 View  Result Date: 09/15/2020 CLINICAL DATA:  Right lower quadrant abdominal pain. EXAM: ABDOMEN - 1 VIEW COMPARISON:  None. FINDINGS: The bowel gas pattern is normal. A moderate amount of stool is seen within the ascending colon. No radio-opaque calculi or other significant radiographic abnormality are seen. IMPRESSION: Negative. Electronically Signed   By: Virgina Norfolk M.D.   On: 09/15/2020 04:03   US Renal  Result Date: 09/12/2020 CLINICAL DATA:  Acute renal failure. EXAM: RENAL / URINARY TRACT  ULTRASOUND COMPLETE COMPARISON:  None. FINDINGS: Right Kidney: Renal measurements: 9.4 cm x 4.0 cm x 4.7 cm = volume: 92.0 mL. Diffusely increased echogenicity of the renal parenchyma is noted. A 2.7 cm x 2.7 cm x 2.5 cm anechoic structure is seen within the right kidney. No abnormal flow is noted within this region on color Doppler evaluation. No hydronephrosis is visualized. Left Kidney: Renal measurements: 9.9 cm x 6.0 cm x 5.8 cm = volume: 182.1 mL. Diffusely increased echogenicity of the renal parenchyma is noted. A 1.2 cm x 1.4 cm x 1.0 cm anechoic structure is seen within the left kidney. No abnormal flow is noted within this region on color Doppler evaluation. No hydronephrosis is visualized. Bladder: Appears normal for degree of bladder distention. Bilateral ureteral jets are visualized. Other: None. IMPRESSION: 1. Bilateral simple renal cysts. 2. Increased echogenicity of the kidneys which may be secondary to medical renal disease. Electronically Signed   By: Virgina Norfolk M.D.   On: 09/12/2020 20:54   VAS [place] LOWER EXTREMITY VENOUS (DVT) (ONLY [facility] & [facility] 7a-7p)  Result Date: 09/26/2020  Lower Venous DVT Study Patient Name:  Hannah [name]  Date of Exam:   09/26/2020 Medical Rec #: [id number]     Accession #:    [id number] Date of Birth: October 22, 1965    Patient Gender: F Patient Age:   54Y Exam Location:  [facility] Procedure:      VAS [place] LOWER EXTREMITY VENOUS (DVT) Referring Phys: [id number] LAURA A MURPHY --------------------------------------------------------------------------------  Indications: Edema.  Comparison Study: No previous exams Performing Technologist: Rogelia Rohrer  Examination Guidelines: A complete evaluation includes B-mode imaging, spectral Doppler, color Doppler, and power Doppler as needed of all accessible portions of each vessel. Bilateral testing is considered an integral part of a complete examination. Limited examinations for reoccurring indications may be performed as noted.  The reflux portion of the exam is performed with the patient in reverse Trendelenburg.  +---------+---------------+---------+-----------+----------+--------------+ RIGHT    CompressibilityPhasicitySpontaneityPropertiesThrombus Aging +---------+---------------+---------+-----------+----------+--------------+ CFV      Full           Yes      Yes                                 +---------+---------------+---------+-----------+----------+--------------+  SFJ      Full                                                        +---------+---------------+---------+-----------+----------+--------------+ FV Prox  Full           Yes      Yes                                 +---------+---------------+---------+-----------+----------+--------------+ FV Mid   Full           Yes      Yes                                 +---------+---------------+---------+-----------+----------+--------------+ FV DistalFull           Yes      Yes                                 +---------+---------------+---------+-----------+----------+--------------+ PFV      Full                                                        +---------+---------------+---------+-----------+----------+--------------+ POP      Full           Yes      Yes                                 +---------+---------------+---------+-----------+----------+--------------+ PTV      Full                                                        +---------+---------------+---------+-----------+----------+--------------+ PERO     Full                                                        +---------+---------------+---------+-----------+----------+--------------+   +----+---------------+---------+-----------+----------+--------------+ LEFTCompressibilityPhasicitySpontaneityPropertiesThrombus Aging +----+---------------+---------+-----------+----------+--------------+ CFV Full           Yes      Yes                                  +----+---------------+---------+-----------+----------+--------------+     Summary: RIGHT: - There is no evidence of deep vein thrombosis in the lower extremity. - There is no evidence of superficial venous thrombosis.  - No cystic structure found in the popliteal fossa. Subcutaneous edema from mid thigh to ankle  LEFT: - No evidence of common femoral vein obstruction.  *See table(s) above for measurements and observations. Electronically signed by Servando Snare MD on 09/26/2020 at  4:55:05 PM.    Final     Labs:  CBC: Recent Labs    09/16/20 0523 09/17/20 0513 09/26/20 2127 09/27/20 0330  WBC 6.8 6.4 7.0 6.3  HGB 8.6* 7.9* 8.2* 8.0*  HCT 27.3* 24.6* 25.5* 24.9*  PLT 183 179 222 213    COAGS: Recent Labs    06/09/20 0750  INR 0.9  APTT 29    BMP: Recent Labs    09/16/20 0523 09/17/20 0513 09/26/20 1731 09/27/20 0330  NA 138 137 137 138  K 3.9 3.8 4.1 3.8  CL 112* 112* 106 106  CO2 18* 18* 23 22  GLUCOSE 111* 107* 175* 116*  BUN 40* 40* 37* 32*  CALCIUM 8.9 8.7* 8.8* 8.7*  CREATININE 4.71* 5.21* 5.34* 5.45*  GFRNONAA 10* 9* 9* 9*    LIVER FUNCTION TESTS: Recent Labs    06/09/20 0750 06/13/20 0809 09/12/20 1859 09/13/20 0438 09/14/20 0546 09/15/20 0517 09/16/20 0523 09/17/20 0513 09/27/20 0330  BILITOT 0.4 0.5 0.5 0.3  --   --   --   --   --   AST 17 14* 14* 11*  --   --   --   --   --   ALT '10 9 10 8  '$ --   --   --   --   --   ALKPHOS 67 46 75 59  --   --   --   --   --   PROT 7.6 5.5* 8.7* 6.7  --   --   --   --   --   ALBUMIN 3.1* 2.4* 3.8 2.9*   < > 2.6* 2.8* 2.5* 2.7*   < > = values in this interval not displayed.    TUMOR MARKERS: No results for input(s): AFPTM, CEA, CA199, CHROMGRNA in the last 8760 hours.  Assessment and Plan:  Advanced CKD with uremic symptoms: Hannah [name], 55 year old female, is tentatively scheduled for placement of a tunneled dialysis catheter 09/29/20. The procedure was discussed with the patient and her  daughter at the bedside. The consent was signed by the daughter at the patient's request.   Risks and benefits discussed with the patient including, but not limited to bleeding, infection, vascular injury, pneumothorax which may require chest tube placement, air embolism or even death  All of the patient's questions were answered, patient is agreeable to proceed. She will be NPO at midnight 09/29/20. Lab orders will be placed.   Consent signed and in IR.   Thank you for this interesting consult.  I greatly enjoyed meeting Hannah [name] and look forward to participating in their care.  A copy of this report was sent to the requesting provider on this date.  Electronically Signed: Soyla Dryer, AGACNP-BC [phone] 09/27/2020, 1:42 PM   I spent a total of 20 Minutes    in face to face in clinical consultation, greater than 50% of which was counseling/coordinating care for tunneled dialysis catheter.

## 2020-09-27 NOTE — ED Notes (Signed)
[facility] called for transport. 

## 2020-09-27 NOTE — Progress Notes (Signed)
Received patient from [facility] ED awake,alert/orientedx4 and able to verbalize needs. VSS; NAD noted; respirations easy on room air. Weakness noted to R side; patient states " it is from a previous CVA." Movement noted to extremities. Skin intact and verified with second RN. Oriented to room and floor. Whiteboard updated. All safety measures in place and personal belongings within reach.

## 2020-09-27 NOTE — Progress Notes (Signed)
PROGRESS NOTE    Hannah [name]  [id number] DOB: 03-Jul-1965 DOA: 09/26/2020 PCP: Vernie Shanks, MD  Outpatient Specialists:   Brief Narrative:  As per H&P done by Dr. Neva Seat: "Hannah [name] is a 55 y.o. female with medical history significant of CKD 5, anemia, CVA, diabetes, hypertension, hyperlipidemia, multiple sclerosis who presents with shortness of breath and lower extremity swelling.             Reports 2 to 3 days of shortness of breath and will lower extremity swelling right greater than left.  Patient was sent to the ED after being evaluated by PCP.             She was recently admitted 2 weeks ago for AKI on CKD 4.  Ultrasound of the kidneys was unremarkable at that time and no other etiology was found so was believed it is progression of her CKD to CKD 5.  She had her blood pressure regimen adjusted at that time and was planning to follow-up with nephrology outpatient.             She reports on going headache. She denies fevers, chills, chest pain, abdominal pain, constipation, diarrhea, nausea, vomiting.   ED Course: Vital signs in the ED were stable.  Blood pressure in the [id number] systolic.  Lab work-up showed creatinine at 5.34 which is similar to 9 days ago.  BUN 37.  Calcium 8.9.  CBC with hemoglobin of 8.2 which is stable.  BNP mildly elevated to 130 from 77 2 weeks ago.  Respiratory panel for flu and COVID pending.  DVT study was negative for DVT.  Chest x-ray showed ill-defined opacity consistent with pneumonia in the right middle and lower lobes.  And some atelectasis in the left lower lobe.  Nephrology was consulted and due to concern of worsening CKD now with volume overload patient is likely approaching ESRD and will need diuresis and possible initiation on dialysis".  09/27/2020: Patient seen by the nephrology team.  Plan is to initiate hemodialysis on this admission, with long-term plan of home dialysis.  Hemodialysis access to be created.  Leg edema has improved  with diuresis.  We will proceed with echocardiogram to rule out pericardial effusion.  Patient was seen alongside patient's daughter.  Updated patient and patient's daughter extensively.   Assessment & Plan:   Principal Problem:   Volume overload Active Problems:   Multiple sclerosis (HCC)   Hyperlipidemia   Uncontrolled type 2 diabetes mellitus with hyperglycemia (HCC)   Essential hypertension   H/O: CVA (cerebrovascular accident)   CKD (chronic kidney disease) stage 5, GFR less than 15 ml/min (HCC)   SOB (shortness of breath)   CKD 5, progressed to ESRD with likely uremic symptoms: - Patient presenting with volume overload with lower extremity edema.   -Nephrology input is appreciated.  For PermCath placement.  Vascular surgery consult.  Hemodialysis to be initiated on this admission.  Iron studies and CKD MBD management plan.  Dialysis education also planned. -Leg edema is improving. -Echocardiogram to rule out pericardial effusion.  Anemia -Follow iron studies. -Likely IV iron and ESA. -Goal hemoglobin of 9.5 to 11.5 g/dL. -We will defer to the nephrology team.  Input is highly appreciated.  History of CVA Hyperlipidemia - Continue home aspirin, Plavix, statin  Hypertension - Continue Coreg and hydralazine -Goal blood pressure should be less than 130/80 mmHg.  Diabetes > On sliding scale long-acting at home. But state she has recently been using on 10U and  had some low glucose with this.  Also 10 units with meals. - We will start on SSI only considering recent lows and worsening renal function 09/27/2020: Continue to optimize.   DVT prophylaxis: Subcutaneous heparin Code Status: Full code Family Communication: Daughter Disposition Plan: Home eventually   Consultants:   Nephrology  Procedures:   PermCath placement is planned.  Echocardiogram has been ordered.  Antimicrobials:   None   Subjective: Edema is improving. Shortness of breath is  improving.  Objective: Vitals:   09/27/20 0130 09/27/20 0159 09/27/20 0745 09/27/20 1229  BP: (!) 163/66 (!) 160/81 (!) 145/69 (!) 143/62  Pulse: 96 96 90 92  Resp: (!) '24 20 18 18  '$ Temp:  98 F (36.7 C) 98.3 F (36.8 C) 98.5 F (36.9 C)  TempSrc:  Oral  Oral  SpO2: 97% 97% 100%   Weight:      Height:        Intake/Output Summary (Last 24 hours) at 09/27/2020 1720 Last data filed at 09/27/2020 0900 Gross per 24 hour  Intake 775 ml  Output 800 ml  Net -25 ml   Filed Weights   09/26/20 1417  Weight: 63.5 kg    Examination:  General exam: Appears calm and comfortable  Respiratory system: Clear to auscultation.  Cardiovascular system: S1 & S2 heard Gastrointestinal system: Abdomen is nondistended, soft and nontender. No organomegaly or masses felt. Normal bowel sounds heard. Central nervous system: Alert and oriented.  Patient seems to move all extremities. Extremities: Mild leg edema.  Data Reviewed: I have personally reviewed following labs and imaging studies  CBC: Recent Labs  Lab 09/26/20 2127 09/27/20 0330  WBC 7.0 6.3  NEUTROABS 4.6  --   HGB 8.2* 8.0*  HCT 25.5* 24.9*  MCV 87.0 86.8  PLT 222 [id number]   Basic Metabolic Panel: Recent Labs  Lab 09/26/20 1731 09/27/20 0330  NA 137 138  K 4.1 3.8  CL 106 106  CO2 23 22  GLUCOSE 175* 116*  BUN 37* 32*  CREATININE 5.34* 5.45*  CALCIUM 8.8* 8.7*  MG 2.1  --   PHOS 4.2 4.4   GFR: Estimated Creatinine Clearance: 10.1 mL/min (A) (by C-G formula based on SCr of 5.45 mg/dL (H)). Liver Function Tests: Recent Labs  Lab 09/27/20 0330  ALBUMIN 2.7*   No results for input(s): LIPASE, AMYLASE in the last 168 hours. No results for input(s): AMMONIA in the last 168 hours. Coagulation Profile: No results for input(s): INR, PROTIME in the last 168 hours. Cardiac Enzymes: No results for input(s): CKTOTAL, CKMB, CKMBINDEX, TROPONINI in the last 168 hours. BNP (last 3 results) No results for input(s): PROBNP in  the last 8760 hours. HbA1C: No results for input(s): HGBA1C in the last 72 hours. CBG: Recent Labs  Lab 09/27/20 0809 09/27/20 1226 09/27/20 1633  GLUCAP 117* 222* 175*   Lipid Profile: No results for input(s): CHOL, HDL, LDLCALC, TRIG, CHOLHDL, LDLDIRECT in the last 72 hours. Thyroid Function Tests: No results for input(s): TSH, T4TOTAL, FREET4, T3FREE, THYROIDAB in the last 72 hours. Anemia Panel: No results for input(s): VITAMINB12, FOLATE, FERRITIN, TIBC, IRON, RETICCTPCT in the last 72 hours. Urine analysis:    Component Value Date/Time   COLORURINE STRAW (A) 09/12/2020 2208   APPEARANCEUR CLEAR 09/12/2020 2208   LABSPEC 1.007 09/12/2020 2208   PHURINE 6.0 09/12/2020 2208   GLUCOSEU >=500 (A) 09/12/2020 2208   HGBUR NEGATIVE 09/12/2020 [place] 09/12/2020 2208   [place] 09/12/2020 2208  PROTEINUR >=300 (A) 09/12/2020 2208   NITRITE NEGATIVE 09/12/2020 2208   LEUKOCYTESUR NEGATIVE 09/12/2020 2208   Sepsis Labs: '@LABRCNTIP'$ (procalcitonin:4,lacticidven:4)  ) Recent Results (from the past 240 hour(s))  Resp Panel by RT-PCR (Flu A&B, Covid) Nasopharyngeal Swab     Status: None   Collection Time: 09/26/20  9:27 PM   Specimen: Nasopharyngeal Swab; Nasopharyngeal(NP) swabs in vial transport medium  Result Value Ref Range Status   SARS Coronavirus 2 by RT PCR NEGATIVE NEGATIVE Final    Comment: (NOTE) SARS-CoV-2 target nucleic acids are NOT DETECTED.  The SARS-CoV-2 RNA is generally detectable in upper respiratory specimens during the acute phase of infection. The lowest concentration of SARS-CoV-2 viral copies this assay can detect is 138 copies/mL. A negative result does not preclude SARS-Cov-2 infection and should not be used as the sole basis for treatment or other patient management decisions. A negative result may occur with  improper specimen collection/handling, submission of specimen other than nasopharyngeal swab, presence of  viral mutation(s) within the areas targeted by this assay, and inadequate number of viral copies(<138 copies/mL). A negative result must be combined with clinical observations, patient history, and epidemiological information. The expected result is Negative.  Fact Sheet for Patients:  EntrepreneurPulse.com.au  Fact Sheet for Healthcare Providers:  IncredibleEmployment.be  This test is no t yet approved or cleared by the [place] FDA and  has been authorized for detection and/or diagnosis of SARS-CoV-2 by FDA under an Emergency Use Authorization (EUA). This EUA will remain  in effect (meaning this test can be used) for the duration of the COVID-19 declaration under Section 564(b)(1) of the Act, 21 U.S.C.section 360bbb-3(b)(1), unless the authorization is terminated  or revoked sooner.       Influenza A by PCR NEGATIVE NEGATIVE Final   Influenza B by PCR NEGATIVE NEGATIVE Final    Comment: (NOTE) The Xpert Xpress SARS-CoV-2/FLU/RSV plus assay is intended as an aid in the diagnosis of influenza from Nasopharyngeal swab specimens and should not be used as a sole basis for treatment. Nasal washings and aspirates are unacceptable for Xpert Xpress SARS-CoV-2/FLU/RSV testing.  Fact Sheet for Patients: EntrepreneurPulse.com.au  Fact Sheet for Healthcare Providers: IncredibleEmployment.be  This test is not yet approved or cleared by the [place] FDA and has been authorized for detection and/or diagnosis of SARS-CoV-2 by FDA under an Emergency Use Authorization (EUA). This EUA will remain in effect (meaning this test can be used) for the duration of the COVID-19 declaration under Section 564(b)(1) of the Act, 21 U.S.C. section 360bbb-3(b)(1), unless the authorization is terminated or revoked.  Performed at [facility], [place] [address], [place],  [address]           Radiology Studies: DG Chest 2 View  Result Date: 09/26/2020 CLINICAL DATA:  Shortness of breath EXAM: CHEST - 2 VIEW COMPARISON:  None. FINDINGS: There is ill-defined airspace opacity in portions of the right middle and lower lobes. There is slight left base atelectasis. Lungs elsewhere clear. Heart is upper normal in size with pulmonary vascularity normal. No adenopathy. No bone lesions. IMPRESSION: Ill-defined opacity consistent with pneumonia in portions of the right middle and lower lobes. Mild left base atelectasis. Lungs otherwise clear. Heart upper normal in size. Electronically Signed   By: Lowella Grip III M.D.   On: 09/26/2020 15:06   VAS [place] LOWER EXTREMITY VENOUS (DVT) (ONLY [facility] & [facility] 7a-7p)  Result Date: 09/26/2020  Lower Venous DVT Study Patient Name:  Hannah [name]  Date  of Exam:   09/26/2020 Medical Rec #: [id number]     Accession #:    [id number] Date of Birth: 1966/02/09    Patient Gender: F Patient Age:   54Y Exam Location:  [facility] Procedure:      VAS [place] LOWER EXTREMITY VENOUS (DVT) Referring Phys: [id number] LAURA A MURPHY --------------------------------------------------------------------------------  Indications: Edema.  Comparison Study: No previous exams Performing Technologist: Rogelia Rohrer  Examination Guidelines: A complete evaluation includes B-mode imaging, spectral Doppler, color Doppler, and power Doppler as needed of all accessible portions of each vessel. Bilateral testing is considered an integral part of a complete examination. Limited examinations for reoccurring indications may be performed as noted. The reflux portion of the exam is performed with the patient in reverse Trendelenburg.  +---------+---------------+---------+-----------+----------+--------------+ RIGHT    CompressibilityPhasicitySpontaneityPropertiesThrombus Aging +---------+---------------+---------+-----------+----------+--------------+ CFV      Full           Yes      Yes                                  +---------+---------------+---------+-----------+----------+--------------+ SFJ      Full                                                        +---------+---------------+---------+-----------+----------+--------------+ FV Prox  Full           Yes      Yes                                 +---------+---------------+---------+-----------+----------+--------------+ FV Mid   Full           Yes      Yes                                 +---------+---------------+---------+-----------+----------+--------------+ FV DistalFull           Yes      Yes                                 +---------+---------------+---------+-----------+----------+--------------+ PFV      Full                                                        +---------+---------------+---------+-----------+----------+--------------+ POP      Full           Yes      Yes                                 +---------+---------------+---------+-----------+----------+--------------+ PTV      Full                                                        +---------+---------------+---------+-----------+----------+--------------+  PERO     Full                                                        +---------+---------------+---------+-----------+----------+--------------+   +----+---------------+---------+-----------+----------+--------------+ LEFTCompressibilityPhasicitySpontaneityPropertiesThrombus Aging +----+---------------+---------+-----------+----------+--------------+ CFV Full           Yes      Yes                                 +----+---------------+---------+-----------+----------+--------------+     Summary: RIGHT: - There is no evidence of deep vein thrombosis in the lower extremity. - There is no evidence of superficial venous thrombosis.  - No cystic structure found in the popliteal fossa. Subcutaneous edema from mid thigh to ankle  LEFT: - No evidence of  common femoral vein obstruction.  *See table(s) above for measurements and observations. Electronically signed by Servando Snare MD on 09/26/2020 at 4:55:05 PM.    Final         Scheduled Meds: . amLODipine  10 mg Oral Daily  . [START ON 09/29/2020]  ceFAZolin (ANCEF) IV  1 g Intravenous to XRAY  . famotidine  20 mg Oral Daily  . FLUoxetine  20 mg Oral Daily  . furosemide  80 mg Intravenous Daily  . heparin  5,000 Units Subcutaneous Q8H  . hydrALAZINE  50 mg Oral Q8H  . insulin aspart  0-6 Units Subcutaneous TID WC  . melatonin  3 mg Oral QHS  . rosuvastatin  10 mg Oral Daily  . sodium bicarbonate  650 mg Oral TID  . sodium chloride flush  3 mL Intravenous Q12H   Continuous Infusions:   LOS: 0 days    Time spent: 35 minutes.    Dana Allan, MD  [facility] Pager #: [phone] 7PM-7AM contact night coverage as above

## 2020-09-27 NOTE — Progress Notes (Signed)
  Echocardiogram 2D Echocardiogram has been performed.  Hannah [name] F 09/27/2020, 5:12 PM

## 2020-09-27 NOTE — Consult Note (Signed)
[facility] Renal Consultation Note  Requesting MD: Ogbata Indication for Consultation: advanced CKD-  uremic  HPI:  Hannah [name] is a 55 y.o. female with past medical history significant for DM, HTN, s/p CVA and diagnosis of MS not on treatment.  She also has chronic and progressive CKD-  Has seen Dr. Joelyn Oms a couple of times at [facility] but last was in early 2021 when crt was 3.  He felt her CKD was mostly due to her DM-  Then followed by several no shows for appts.  She has had a rough few months-  Another stroke in January of 2022.  Then another hosp in late April for 'abnormal labs"  crt up to 6.3 at that time, up from 3.9-  Was given IVF with improvement to 4.7.  She was brought back to the hospital last night due to edema.  Crt still quite abnormal at over 5 indicating a GFR of 9.  Upon further interviewing pt reports a decreased appetite, occasional nausea-  Has albumin of 2.7.  She also notes decreased energy in addition to this swelling.  I have told her that I think all of her symptoms are due to her advanced CKD and I think that she needs support with dialysis to get better.  She is very upset to hear this, crying.  I talked to her daughter Hannah [name] as well-  There will be family coming in today to see her and support her.    Creatinine, Ser  Date/Time Value Ref Range Status  09/27/2020 03:30 AM 5.45 (H) 0.44 - 1.00 mg/dL Final  09/26/2020 05:31 PM 5.34 (H) 0.44 - 1.00 mg/dL Final  09/17/2020 05:13 AM 5.21 (H) 0.44 - 1.00 mg/dL Final  09/16/2020 05:23 AM 4.71 (H) 0.44 - 1.00 mg/dL Final  09/15/2020 05:17 AM 4.88 (H) 0.44 - 1.00 mg/dL Final  09/14/2020 05:46 AM 5.30 (H) 0.44 - 1.00 mg/dL Final  09/13/2020 04:38 AM 5.60 (H) 0.44 - 1.00 mg/dL Final  09/12/2020 08:25 PM 6.30 (H) 0.44 - 1.00 mg/dL Final  09/12/2020 06:59 PM 5.85 (H) 0.44 - 1.00 mg/dL Final  06/23/2020 06:46 AM 3.92 (H) 0.44 - 1.00 mg/dL Final  06/17/2020 04:54 AM 4.42 (H) 0.44 - 1.00 mg/dL Final  06/13/2020  08:09 AM 3.98 (H) 0.44 - 1.00 mg/dL Final  06/12/2020 04:05 PM 3.65 (H) 0.44 - 1.00 mg/dL Final  06/12/2020 02:04 AM 3.67 (H) 0.44 - 1.00 mg/dL Final  06/11/2020 12:05 PM 3.70 (H) 0.44 - 1.00 mg/dL Final  06/09/2020 08:03 AM 4.30 (H) 0.44 - 1.00 mg/dL Final  06/09/2020 07:50 AM 4.11 (H) 0.44 - 1.00 mg/dL Final     PMHx:   Past Medical History:  Diagnosis Date  . Cerebral infarction ([place]) 07/12/2017   2009 first one, has had a total of 4 stokes  . Diabetes type 2, controlled ([place])   . Hemiparesis affecting right side as late effect of cerebrovascular accident (CVA) ([place])   . Hyperlipidemia   . Hypertension   . Multiple sclerosis ([place]) 2009  . Stroke [facility])     Past Surgical History:  Procedure Laterality Date  . APPENDECTOMY    . CYSTOSTOMY W/ BLADDER BIOPSY    . OVARIAN CYST SURGERY    . stent placed in heart      Family Hx: History reviewed. No pertinent family history.  Social History:  reports that she has never smoked. She has never used smokeless tobacco. She reports previous alcohol use. She reports that she does not  use drugs.  Allergies:  Allergies  Allergen Reactions  . Ibuprofen Other (See Comments) and Swelling    Edema Swelling to hands, feet and face   . Influenza Vaccines Anaphylaxis    Swelling, fever  . Pneumococcal Vaccine Anaphylaxis    Swelling, fever Swelling, fever   . Promethazine Other (See Comments)    hallucinations hallucinations   . Ambien [Zolpidem]     Other reaction(s): Unknown  . Cyclobenzaprine Other (See Comments)    hallucinations   . Tape Itching  . Tramadol Hcl     Other reaction(s): hallucinations  . Oxycodone Nausea Only and Nausea And Vomiting    Medications: Prior to Admission medications   Medication Sig Start Date End Date Taking? Authorizing Provider  acetaminophen (TYLENOL) 325 MG tablet Take 2 tablets (650 mg total) by mouth every 4 (four) hours as needed for mild pain (or temp > 37.5 C (99.5 F)). 06/25/20   Yes Angiulli, Lavon Paganini, PA-C  amLODipine (NORVASC) 10 MG tablet Take 1 tablet (10 mg total) by mouth daily. 06/25/20  Yes Angiulli, Lavon Paganini, PA-C  clopidogrel (PLAVIX) 75 MG tablet Take 1 tablet (75 mg total) by mouth daily. 06/25/20  Yes Angiulli, Lavon Paganini, PA-C  diclofenac Sodium (VOLTAREN) 1 % GEL Apply 2 g topically 4 (four) times daily. Patient taking differently: Apply 2 g topically 4 (four) times daily as needed (pain). 06/25/20  Yes Angiulli, Lavon Paganini, PA-C  ergocalciferol (VITAMIN D2) 1.25 MG (50000 UT) capsule Take 50,000 Units by mouth once a week.   Yes [provider]  famotidine (PEPCID) 20 MG tablet Take 1 tablet (20 mg total) by mouth daily. 06/25/20  Yes Angiulli, Lavon Paganini, PA-C  FLUoxetine (PROZAC) 20 MG capsule Take 1 capsule (20 mg total) by mouth daily. 06/25/20  Yes Angiulli, Lavon Paganini, PA-C  glipiZIDE (GLUCOTROL) 10 MG tablet Take 1 tablet (10 mg total) by mouth 2 (two) times daily before a meal. 06/25/20  Yes Angiulli, Lavon Paganini, PA-C  hydrALAZINE (APRESOLINE) 50 MG tablet Take 1 tablet (50 mg total) by mouth every 8 (eight) hours. 09/17/20  Yes Mercy Riding, MD  insulin degludec (TRESIBA FLEXTOUCH) 100 UNIT/ML FlexTouch Pen Inject 15-45 Units into the skin at bedtime. Sliding scale   Yes [provider]  insulin lispro (INSULIN LISPRO) 100 UNIT/ML KwikPen Junior Inject 10 Units into the skin 3 (three) times daily.   Yes [provider]  melatonin 3 MG TABS tablet Take 1 tablet (3 mg total) by mouth at bedtime. 06/25/20  Yes Angiulli, Lavon Paganini, PA-C  nitroGLYCERIN (NITROSTAT) 0.4 MG SL tablet Place 1 tablet (0.4 mg total) under the tongue every 5 (five) minutes as needed for chest pain. 06/25/20  Yes Angiulli, Lavon Paganini, PA-C  rosuvastatin (CRESTOR) 20 MG tablet Take 1 tablet (20 mg total) by mouth daily. 06/25/20  Yes Angiulli, Lavon Paganini, PA-C  senna-docusate (SENOKOT-S) 8.6-50 MG tablet Take 1 tablet by mouth 2 (two) times daily as needed for moderate constipation.  09/17/20  Yes Mercy Riding, MD  sodium bicarbonate 650 MG tablet Take 1 tablet (650 mg total) by mouth 3 (three) times daily. 09/17/20  Yes Mercy Riding, MD  tiZANidine (ZANAFLEX) 2 MG tablet Take 1 tablet (2 mg total) by mouth 3 (three) times daily. 06/25/20  Yes Angiulli, Lavon Paganini, PA-C  carvedilol (COREG) 25 MG tablet Take 1 tablet (25 mg total) by mouth 2 (two) times daily with a meal. Patient not taking: No sig reported 09/17/20  Mercy Riding, MD    I have reviewed the patient's current medications.  Labs:  Results for orders placed or performed during the hospital encounter of 09/26/20 (from the past 48 hour(s))  Basic metabolic panel     Status: Abnormal   Collection Time: 09/26/20  5:31 PM  Result Value Ref Range   Sodium 137 135 - 145 mmol/L   Potassium 4.1 3.5 - 5.1 mmol/L   Chloride 106 98 - 111 mmol/L   CO2 23 22 - 32 mmol/L   Glucose, Bld 175 (H) 70 - 99 mg/dL    Comment: Glucose reference range applies only to samples taken after fasting for at least 8 hours.   BUN 37 (H) 6 - 20 mg/dL   Creatinine, Ser 5.34 (H) 0.44 - 1.00 mg/dL   Calcium 8.8 (L) 8.9 - 10.3 mg/dL   GFR, Estimated 9 (L) >60 mL/min    Comment: (NOTE) Calculated using the CKD-EPI Creatinine Equation (2021)    Anion gap 8 5 - 15    Comment: Performed at [facility], [place] [address] [place], [place] [address]  Brain natriuretic peptide     Status: Abnormal   Collection Time: 09/26/20  5:31 PM  Result Value Ref Range   B Natriuretic Peptide 130.4 (H) 0.0 - 100.0 pg/mL    Comment: Performed at [facility], [place] [address] [place], [place] [address]  Phosphorus     Status: None   Collection Time: 09/26/20  5:31 PM  Result Value Ref Range   Phosphorus 4.2 2.5 - 4.6 mg/dL    Comment: Performed at [facility], [place] [address], [place], [place] [address]  Magnesium     Status: None   Collection Time: 09/26/20  5:31 PM  Result Value Ref Range   Magnesium  2.1 1.7 - 2.4 mg/dL    Comment: Performed at [facility], [place] [address] [place], [place] [address]  CBC with Differential     Status: Abnormal   Collection Time: 09/26/20  9:27 PM  Result Value Ref Range   WBC 7.0 4.0 - 10.5 K/uL   RBC 2.93 (L) 3.87 - 5.11 MIL/uL   Hemoglobin 8.2 (L) 12.0 - 15.0 g/dL   HCT 25.5 (L) 36.0 - 46.0 %   MCV 87.0 80.0 - 100.0 fL   MCH 28.0 26.0 - 34.0 pg   MCHC 32.2 30.0 - 36.0 g/dL   RDW 14.2 11.5 - 15.5 %   Platelets 222 150 - 400 K/uL   nRBC 0.0 0.0 - 0.2 %   Neutrophils Relative % 67 %   Neutro Abs 4.6 1.7 - 7.7 K/uL   Lymphocytes Relative 18 %   Lymphs Abs 1.3 0.7 - 4.0 K/uL   Monocytes Relative 10 %   Monocytes Absolute 0.7 0.1 - 1.0 K/uL   Eosinophils Relative 4 %   Eosinophils Absolute 0.3 0.0 - 0.5 K/uL   Basophils Relative 1 %   Basophils Absolute 0.0 0.0 - 0.1 K/uL   Immature Granulocytes 0 %   Abs Immature Granulocytes 0.02 0.00 - 0.07 K/uL    Comment: Performed at [facility], [place] [address] [place], [place] [address]  Resp Panel by RT-PCR (Flu A&B, Covid) Nasopharyngeal Swab     Status: None   Collection Time: 09/26/20  9:27 PM   Specimen: Nasopharyngeal Swab; Nasopharyngeal(NP) swabs in vial transport medium  Result Value Ref Range   SARS Coronavirus 2 by RT PCR NEGATIVE  NEGATIVE    Comment: (NOTE) SARS-CoV-2 target nucleic acids are NOT DETECTED.  The SARS-CoV-2 RNA is generally detectable in upper respiratory specimens during the acute phase of infection. The lowest concentration of SARS-CoV-2 viral copies this assay can detect is 138 copies/mL. A negative result does not preclude SARS-Cov-2 infection and should not be used as the sole basis for treatment or other patient management decisions. A negative result may occur with  improper specimen collection/handling, submission of specimen other than nasopharyngeal swab, presence of viral mutation(s) within the areas targeted by this  assay, and inadequate number of viral copies(<138 copies/mL). A negative result must be combined with clinical observations, patient history, and epidemiological information. The expected result is Negative.  Fact Sheet for Patients:  EntrepreneurPulse.com.au  Fact Sheet for Healthcare Providers:  IncredibleEmployment.be  This test is no t yet approved or cleared by the [place] FDA and  has been authorized for detection and/or diagnosis of SARS-CoV-2 by FDA under an Emergency Use Authorization (EUA). This EUA will remain  in effect (meaning this test can be used) for the duration of the COVID-19 declaration under Section 564(b)(1) of the Act, 21 U.S.C.section 360bbb-3(b)(1), unless the authorization is terminated  or revoked sooner.       Influenza A by PCR NEGATIVE NEGATIVE   Influenza B by PCR NEGATIVE NEGATIVE    Comment: (NOTE) The Xpert Xpress SARS-CoV-2/FLU/RSV plus assay is intended as an aid in the diagnosis of influenza from Nasopharyngeal swab specimens and should not be used as a sole basis for treatment. Nasal washings and aspirates are unacceptable for Xpert Xpress SARS-CoV-2/FLU/RSV testing.  Fact Sheet for Patients: EntrepreneurPulse.com.au  Fact Sheet for Healthcare Providers: IncredibleEmployment.be  This test is not yet approved or cleared by the [place] FDA and has been authorized for detection and/or diagnosis of SARS-CoV-2 by FDA under an Emergency Use Authorization (EUA). This EUA will remain in effect (meaning this test can be used) for the duration of the COVID-19 declaration under Section 564(b)(1) of the Act, 21 U.S.C. section 360bbb-3(b)(1), unless the authorization is terminated or revoked.  Performed at [facility], [place] [address], [place], [place] [address]   CBC     Status: Abnormal   Collection Time: 09/27/20  3:30 AM  Result Value Ref  Range   WBC 6.3 4.0 - 10.5 K/uL   RBC 2.87 (L) 3.87 - 5.11 MIL/uL   Hemoglobin 8.0 (L) 12.0 - 15.0 g/dL   HCT 24.9 (L) 36.0 - 46.0 %   MCV 86.8 80.0 - 100.0 fL   MCH 27.9 26.0 - 34.0 pg   MCHC 32.1 30.0 - 36.0 g/dL   RDW 14.1 11.5 - 15.5 %   Platelets 213 150 - 400 K/uL   nRBC 0.0 0.0 - 0.2 %    Comment: Performed at [facility], [place] [address], [place], [place] [address]  Renal function panel     Status: Abnormal   Collection Time: 09/27/20  3:30 AM  Result Value Ref Range   Sodium 138 135 - 145 mmol/L   Potassium 3.8 3.5 - 5.1 mmol/L   Chloride 106 98 - 111 mmol/L   CO2 22 22 - 32 mmol/L   Glucose, Bld 116 (H) 70 - 99 mg/dL    Comment: Glucose reference range applies only to samples taken after fasting for at least 8 hours.   BUN 32 (H) 6 - 20 mg/dL   Creatinine, Ser 5.45 (H) 0.44 - 1.00 mg/dL  Calcium 8.7 (L) 8.9 - 10.3 mg/dL   Phosphorus 4.4 2.5 - 4.6 mg/dL   Albumin 2.7 (L) 3.5 - 5.0 g/dL   GFR, Estimated 9 (L) >60 mL/min    Comment: (NOTE) Calculated using the CKD-EPI Creatinine Equation (2021)    Anion gap 10 5 - 15    Comment: Performed at [place] [address], [place], [place] [address]  Glucose, capillary     Status: Abnormal   Collection Time: 09/27/20  8:09 AM  Result Value Ref Range   Glucose-Capillary 117 (H) 70 - 99 mg/dL    Comment: Glucose reference range applies only to samples taken after fasting for at least 8 hours.     ROS:  A comprehensive review of systems was negative except for: Constitutional: positive for anorexia and fatigue Cardiovascular: positive for lower extremity edema Gastrointestinal: positive for nausea and vomiting  Physical Exam: Vitals:   09/27/20 0159 09/27/20 0745  BP: (!) 160/81 (!) 145/69  Pulse: 96 90  Resp: 20 18  Temp: 98 F (36.7 C) 98.3 F (36.8 C)  SpO2: 97% 100%     General:  Pleasant alert BF- NAD but admits she is not feeling well overall HEENT: PERRLA, EOMI Neck: no JVD Heart:  tachy Lungs: mostly clear Abdomen: soft, non tender Extremities: min dependent edema Skin: warm and dry Neuro: somewhat emotional when I tell her the news  Assessment/Plan: [name] with DM and HTN with progressive CKD and now what appears to be uremic symptoms.  My recommendation would be to initiate dialysis this admission 1.Renal- advanced CKD that has been longstanding-  Has now progressed to the point where she is uremic.  The most efficient way to get her better is to start dialysis.  She is devastated to hear this news but yet does not know much about dialysis.  She has a very supportive family so I think could be a candidate for home dialysis.  Will need education regarding that and possibly starting at the TCU as an OP.  Will get our education team involved as well. There is no absolute urgency so does not need today but my plan would be to start on Monday-  Will consult IR for [facility]-  Also consult vascular on Monday for perm access and start the CLIP to TCU 2. Hypertension/volume  - BP high and she is volume overloaded.  Are challenging with high dose lasix until can get dialysis started 3. Anemia  - is quite anemic-  Check iron stores- act as needed and get ESA started 4. Bones-  Will check PTH-  Phos is actually not bad probably because she is not eating  5. Acidosis- will stop bicarb when getting dialysis started   Louis Meckel 09/27/2020, 10:18 AM

## 2020-09-27 NOTE — Plan of Care (Signed)

## 2020-09-27 NOTE — Progress Notes (Signed)
Team paged to notify that patient has arrived to unit from [facility]. No new orders at this time.

## 2020-09-28 DIAGNOSIS — J810 Acute pulmonary edema: Secondary | ICD-10-CM

## 2020-09-28 DIAGNOSIS — E8779 Other fluid overload: Secondary | ICD-10-CM | POA: Diagnosis not present

## 2020-09-28 DIAGNOSIS — N186 End stage renal disease: Secondary | ICD-10-CM | POA: Diagnosis not present

## 2020-09-28 DIAGNOSIS — N19 Unspecified kidney failure: Secondary | ICD-10-CM

## 2020-09-28 DIAGNOSIS — E8770 Fluid overload, unspecified: Secondary | ICD-10-CM | POA: Diagnosis not present

## 2020-09-28 LAB — IRON AND TIBC
Iron: 37 ug/dL (ref 28–170)
Saturation Ratios: 19 % (ref 10.4–31.8)
TIBC: 199 ug/dL — ABNORMAL LOW (ref 250–450)
UIBC: 162 ug/dL

## 2020-09-28 LAB — RENAL FUNCTION PANEL
Albumin: 2.5 g/dL — ABNORMAL LOW (ref 3.5–5.0)
Anion gap: 10 (ref 5–15)
BUN: 32 mg/dL — ABNORMAL HIGH (ref 6–20)
CO2: 26 mmol/L (ref 22–32)
Calcium: 8.9 mg/dL (ref 8.9–10.3)
Chloride: 103 mmol/L (ref 98–111)
Creatinine, Ser: 5.45 mg/dL — ABNORMAL HIGH (ref 0.44–1.00)
GFR, Estimated: 9 mL/min — ABNORMAL LOW (ref >60)
Glucose, Bld: 167 mg/dL — ABNORMAL HIGH (ref 70–99)
Phosphorus: 4.9 mg/dL — ABNORMAL HIGH (ref 2.5–4.6)
Potassium: 3.7 mmol/L (ref 3.5–5.1)
Sodium: 139 mmol/L (ref 135–145)

## 2020-09-28 LAB — GLUCOSE, CAPILLARY
Glucose-Capillary: 160 mg/dL — ABNORMAL HIGH (ref 70–99)
Glucose-Capillary: 162 mg/dL — ABNORMAL HIGH (ref 70–99)
Glucose-Capillary: 167 mg/dL — ABNORMAL HIGH (ref 70–99)
Glucose-Capillary: 173 mg/dL — ABNORMAL HIGH (ref 70–99)

## 2020-09-28 LAB — FERRITIN: Ferritin: 23 ng/mL (ref 11–307)

## 2020-09-28 MED ORDER — SODIUM CHLORIDE 0.9 % IV SOLN
250.0000 mg | Freq: Every day | INTRAVENOUS | Status: AC
  Administered 2020-09-28 – 2020-10-01 (×4): 250 mg via INTRAVENOUS
  Filled 2020-09-28 (×6): qty 20

## 2020-09-28 MED ORDER — DARBEPOETIN ALFA 150 MCG/0.3ML IJ SOSY: 150.0000 ug | PREFILLED_SYRINGE | INTRAMUSCULAR | Status: DC

## 2020-09-28 MED ORDER — SODIUM CHLORIDE 0.9 % IV SOLN
INTRAVENOUS | Status: DC | PRN
  Administered 2020-09-28: 250 mL via INTRAVENOUS

## 2020-09-28 MED ORDER — CHLORHEXIDINE GLUCONATE CLOTH 2 % EX PADS
6.0000 | MEDICATED_PAD | Freq: Every day | CUTANEOUS | Status: DC
  Administered 2020-09-29 – 2020-10-18 (×8): 6 via TOPICAL

## 2020-09-28 MED ORDER — METOCLOPRAMIDE HCL 5 MG PO TABS
5.0000 mg | ORAL_TABLET | Freq: Two times a day (BID) | ORAL | Status: DC
  Administered 2020-09-28 – 2020-10-18 (×37): 5 mg via ORAL
  Filled 2020-09-28 (×38): qty 1

## 2020-09-28 NOTE — Progress Notes (Signed)
Subjective:  BP reasonable-  Making good urine- 1800 mls-  Nauseated this AM-  2 daughters are here with other siblings on the phone   Objective Vital signs in last 24 hours: Vitals:   09/27/20 2126 09/28/20 0543 09/28/20 0558 09/28/20 0715  BP: (!) 159/75 (!) 149/73  (!) 149/68  Pulse: 99 98  98  Resp: '18 16  17  '$ Temp: 98.7 F (37.1 C) 99.2 F (37.3 C)  98.6 F (37 C)  TempSrc: Oral Oral  Oral  SpO2: 97% 97%  95%  Weight:   62.6 kg   Height:       Weight change: -0.904 kg  Intake/Output Summary (Last 24 hours) at 09/28/2020 [id number] Last data filed at 09/28/2020 0908 Gross per 24 hour  Intake 123 ml  Output 1150 ml  Net -1027 ml    Assessment/Plan: [name] with DM and HTN with progressive CKD and now what appears to be uremic symptoms.  My recommendation would be to initiate dialysis this admission 1.Renal- advanced CKD that has been longstanding-  Has now progressed to the point where she is uremic.  The most efficient way to get her better is to start dialysis.  She is devastated to hear this news but yet does not know much about dialysis.  She has a very supportive family so I think could be a candidate for home dialysis.  Had some good conversation today regarding dialysis.   Will need education regarding that and possibly starting at the TCU as an OP.  Will get our education team involved as well. There is no absolute urgency so does not need over weekend but my plan would be to start on Monday-  consulted IR for [facility]-  Also consult vascular on Monday for perm access as well as get vein map and start the CLIP to TCU.  Not sure she would be great candidate for transplant-  Not very mobile  2. Hypertension/volume  - BP high and she is volume overloaded.   challenging with high dose lasix until can get dialysis started-  Good results 3. Anemia  - is quite anemic-   iron stores low, will replete and get ESA started 4. Bones-  Will check PTH-  Phos is actually not bad probably  because she is not eating  5. Acidosis- will stop bicarb     Louis Meckel    Labs: Basic Metabolic Panel: Recent Labs  Lab 09/26/20 1731 09/27/20 0330 09/28/20 0239  NA 137 138 139  K 4.1 3.8 3.7  CL 106 106 103  CO2 '23 22 26  '$ GLUCOSE 175* 116* 167*  BUN 37* 32* 32*  CREATININE 5.34* 5.45* 5.45*  CALCIUM 8.8* 8.7* 8.9  PHOS 4.2 4.4 4.9*   Liver Function Tests: Recent Labs  Lab 09/27/20 0330 09/28/20 0239  ALBUMIN 2.7* 2.5*   No results for input(s): LIPASE, AMYLASE in the last 168 hours. No results for input(s): AMMONIA in the last 168 hours. CBC: Recent Labs  Lab 09/26/20 2127 09/27/20 0330  WBC 7.0 6.3  NEUTROABS 4.6  --   HGB 8.2* 8.0*  HCT 25.5* 24.9*  MCV 87.0 86.8  PLT 222 213   Cardiac Enzymes: No results for input(s): CKTOTAL, CKMB, CKMBINDEX, TROPONINI in the last 168 hours. CBG: Recent Labs  Lab 09/27/20 0809 09/27/20 1226 09/27/20 1633 09/27/20 2023 09/28/20 0644  GLUCAP 117* 222* 175* 200* 160*    Iron Studies:  Recent Labs    09/28/20 0239  IRON 37  TIBC 199*  FERRITIN 23   Studies/Results: DG Chest 2 View  Result Date: 09/26/2020 CLINICAL DATA:  Shortness of breath EXAM: CHEST - 2 VIEW COMPARISON:  None. FINDINGS: There is ill-defined airspace opacity in portions of the right middle and lower lobes. There is slight left base atelectasis. Lungs elsewhere clear. Heart is upper normal in size with pulmonary vascularity normal. No adenopathy. No bone lesions. IMPRESSION: Ill-defined opacity consistent with pneumonia in portions of the right middle and lower lobes. Mild left base atelectasis. Lungs otherwise clear. Heart upper normal in size. Electronically Signed   By: Lowella Grip III M.D.   On: 09/26/2020 15:06   ECHOCARDIOGRAM COMPLETE  Result Date: 09/27/2020    ECHOCARDIOGRAM REPORT   Patient Name:   NERINE STURMS Date of Exam: 09/27/2020 Medical Rec #:  [id number]    Height:       62.0 in Accession #:    [id number]    Weight:       140.0 lb Date of Birth:  [birth year]   BSA:          1.643 m Patient Age:    55 years     BP:           143/62 mmHg Patient Gender: F            HR:           86 bpm. Exam Location:  Inpatient Procedure: 2D Echo, Cardiac Doppler and Color Doppler Indications:    Pericardial Effusion  History:        Patient has prior history of Echocardiogram examinations, most                 recent 06/11/2020.  Sonographer:    Merrie Roof RDCS Referring Phys: [place]  1. Left ventricular ejection fraction, by estimation, is 70 to 75%. The left ventricle has hyperdynamic function. The left ventricle has no regional wall motion abnormalities. Left ventricular diastolic function could not be evaluated.  2. Right ventricular systolic function is normal. The right ventricular size is normal. There is mildly elevated pulmonary artery systolic pressure. The estimated right ventricular systolic pressure is 123456 mmHg.  3. A small pericardial effusion is present. The pericardial effusion is circumferential. There is no evidence of cardiac tamponade.  4. The mitral valve is normal in structure. No evidence of mitral valve regurgitation. No evidence of mitral stenosis.  5. The aortic valve is tricuspid. Aortic valve regurgitation is not visualized. Mild aortic valve sclerosis is present, with no evidence of aortic valve stenosis.  6. The inferior vena cava is normal in size with greater than 50% respiratory variability, suggesting right atrial pressure of 3 mmHg. FINDINGS  Left Ventricle: Left ventricular ejection fraction, by estimation, is 70 to 75%. The left ventricle has hyperdynamic function. The left ventricle has no regional wall motion abnormalities. The left ventricular internal cavity size was normal in size. There is no left ventricular hypertrophy. Left ventricular diastolic function could not be evaluated. Right Ventricle: The right ventricular size is normal. No increase in right  ventricular wall thickness. Right ventricular systolic function is normal. There is mildly elevated pulmonary artery systolic pressure. The tricuspid regurgitant velocity is 2.94  m/s, and with an assumed right atrial pressure of 3 mmHg, the estimated right ventricular systolic pressure is 123456 mmHg. Left Atrium: Left atrial size was normal in size. Right Atrium: Right atrial size was normal in size. Pericardium: A small pericardial effusion  is present. The pericardial effusion is circumferential. There is no evidence of cardiac tamponade. Mitral Valve: The mitral valve is normal in structure. No evidence of mitral valve regurgitation. No evidence of mitral valve stenosis. Tricuspid Valve: The tricuspid valve is normal in structure. Tricuspid valve regurgitation is trivial. No evidence of tricuspid stenosis. Aortic Valve: The aortic valve is tricuspid. Aortic valve regurgitation is not visualized. Mild aortic valve sclerosis is present, with no evidence of aortic valve stenosis. Aortic valve mean gradient measures 8.0 mmHg. Aortic valve peak gradient measures 13.8 mmHg. Aortic valve area, by VTI measures 1.96 cm. Pulmonic Valve: The pulmonic valve was normal in structure. Pulmonic valve regurgitation is not visualized. No evidence of pulmonic stenosis. Aorta: The aortic root is normal in size and structure. Venous: The inferior vena cava is normal in size with greater than 50% respiratory variability, suggesting right atrial pressure of 3 mmHg. IAS/Shunts: No atrial level shunt detected by color flow Doppler.  LEFT VENTRICLE PLAX 2D LVIDd:         4.10 cm  Diastology LVIDs:         2.30 cm  LV e' medial:  6.96 cm/s LV PW:         1.30 cm  LV e' lateral: 8.49 cm/s LV IVS:        1.00 cm LVOT diam:     1.90 cm LV SV:         66 LV SV Index:   40 LVOT Area:     2.84 cm  RIGHT VENTRICLE          IVC RV Basal diam:  3.10 cm  IVC diam: 1.20 cm LEFT ATRIUM             Index       RIGHT ATRIUM           Index LA diam:         3.50 cm 2.13 cm/m  RA Area:     15.10 cm LA Vol (A2C):   63.2 ml 38.47 ml/m RA Volume:   37.60 ml  22.89 ml/m LA Vol (A4C):   50.5 ml 30.74 ml/m LA Biplane Vol: 57.1 ml 34.76 ml/m  AORTIC VALVE AV Area (Vmax):    2.06 cm AV Area (Vmean):   1.99 cm AV Area (VTI):     1.96 cm AV Vmax:           186.00 cm/s AV Vmean:          128.000 cm/s AV VTI:            0.338 m AV Peak Grad:      13.8 mmHg AV Mean Grad:      8.0 mmHg LVOT Vmax:         135.00 cm/s LVOT Vmean:        90.000 cm/s LVOT VTI:          0.233 m LVOT/AV VTI ratio: 0.69  AORTA Ao Root diam: 3.00 cm Ao Asc diam:  2.55 cm TRICUSPID VALVE TR Peak grad:   34.6 mmHg TR Vmax:        294.00 cm/s  SHUNTS Systemic VTI:  0.23 m Systemic Diam: 1.90 cm Fransico Him MD Electronically signed by Fransico Him MD Signature Date/Time: 09/27/2020/6:32:09 PM    Final    VAS [place] LOWER EXTREMITY VENOUS (DVT) (ONLY [facility] & [facility] 7a-7p)  Result Date: 09/26/2020  Lower Venous DVT Study Patient Name:  TYLER TRIGUEROS  Date of Exam:  09/26/2020 Medical Rec #: [id number]     Accession #:    [id number] Date of Birth: Oct 29, 1965    Patient Gender: F Patient Age:   78Y Exam Location:  [facility] Procedure:      VAS [place] LOWER EXTREMITY VENOUS (DVT) Referring Phys: [id number] LAURA A MURPHY --------------------------------------------------------------------------------  Indications: Edema.  Comparison Study: No previous exams Performing Technologist: Rogelia Rohrer  Examination Guidelines: A complete evaluation includes B-mode imaging, spectral Doppler, color Doppler, and power Doppler as needed of all accessible portions of each vessel. Bilateral testing is considered an integral part of a complete examination. Limited examinations for reoccurring indications may be performed as noted. The reflux portion of the exam is performed with the patient in reverse Trendelenburg.  +---------+---------------+---------+-----------+----------+--------------+ RIGHT     CompressibilityPhasicitySpontaneityPropertiesThrombus Aging +---------+---------------+---------+-----------+----------+--------------+ CFV      Full           Yes      Yes                                 +---------+---------------+---------+-----------+----------+--------------+ SFJ      Full                                                        +---------+---------------+---------+-----------+----------+--------------+ FV Prox  Full           Yes      Yes                                 +---------+---------------+---------+-----------+----------+--------------+ FV Mid   Full           Yes      Yes                                 +---------+---------------+---------+-----------+----------+--------------+ FV DistalFull           Yes      Yes                                 +---------+---------------+---------+-----------+----------+--------------+ PFV      Full                                                        +---------+---------------+---------+-----------+----------+--------------+ POP      Full           Yes      Yes                                 +---------+---------------+---------+-----------+----------+--------------+ PTV      Full                                                        +---------+---------------+---------+-----------+----------+--------------+  PERO     Full                                                        +---------+---------------+---------+-----------+----------+--------------+   +----+---------------+---------+-----------+----------+--------------+ LEFTCompressibilityPhasicitySpontaneityPropertiesThrombus Aging +----+---------------+---------+-----------+----------+--------------+ CFV Full           Yes      Yes                                 +----+---------------+---------+-----------+----------+--------------+     Summary: RIGHT: - There is no evidence of deep vein thrombosis in the lower  extremity. - There is no evidence of superficial venous thrombosis.  - No cystic structure found in the popliteal fossa. Subcutaneous edema from mid thigh to ankle  LEFT: - No evidence of common femoral vein obstruction.  *See table(s) above for measurements and observations. Electronically signed by Servando Snare MD on 09/26/2020 at 4:55:05 PM.    Final    Medications: Infusions:   Scheduled Medications: . amLODipine  10 mg Oral Daily  . [START ON 09/29/2020]  ceFAZolin (ANCEF) IV  1 g Intravenous to XRAY  . famotidine  20 mg Oral Daily  . FLUoxetine  20 mg Oral Daily  . furosemide  80 mg Intravenous Daily  . heparin  5,000 Units Subcutaneous Q8H  . hydrALAZINE  50 mg Oral Q8H  . insulin aspart  0-6 Units Subcutaneous TID WC  . melatonin  3 mg Oral QHS  . rosuvastatin  10 mg Oral Daily  . sodium bicarbonate  650 mg Oral TID  . sodium chloride flush  3 mL Intravenous Q12H    have reviewed scheduled and prn medications.  Physical Exam: General:  NAD-  C/o nausea- not improved with zofran Heart: RRR Lungs: mostly clear Abdomen: soft, non tender Extremities: mild edema    09/28/2020,9:52 AM  LOS: 1 day

## 2020-09-28 NOTE — Plan of Care (Signed)
  Problem: Education: Goal: Knowledge of General Education information will improve Description: Including pain rating scale, medication(s)/side effects and non-pharmacologic comfort measures Outcome: Progressing   Problem: Health Behavior/Discharge Planning: Goal: Ability to manage health-related needs will improve Outcome: Progressing   Problem: Nutrition: Goal: Adequate nutrition will be maintained Outcome: Progressing   

## 2020-09-28 NOTE — Progress Notes (Signed)
PROGRESS NOTE    Hannah [name]  [id number] DOB: [birth year] DOA: 09/26/2020 PCP: Vernie Shanks, MD  Outpatient Specialists:   Brief Narrative:  Patient is a 55 year old African-American female with past medical history significant for chronic kidney disease stage V that is likely secondary to diabetes mellitus and hypertension; anemia of chronic kidney disease not on IV iron therapy, CVA, hyperlipidemia and multiple sclerosis.  Patient was admitted with volume overload and shortness of breath.  Patient was admitted for further assessment and management.  Patient was started on IV Lasix on presentation.  Shortness of breath is improved significantly.  Leg edema has also improved significantly.  Nephrology team was consulted to assist with patient's management.  Nephrology team documented that patient was uremic.  Plan is to start hemodialysis on this admission.  PermCath to be placed.  Patient is currently on ESRD management.  Echocardiogram done revealed small pericardial effusion.   Assessment & Plan:   Principal Problem:   Volume overload Active Problems:   Multiple sclerosis (HCC)   Hyperlipidemia   Uncontrolled type 2 diabetes mellitus with hyperglycemia (HCC)   Essential hypertension   H/O: CVA (cerebrovascular accident)   CKD (chronic kidney disease) stage 5, GFR less than 15 ml/min (HCC)   SOB (shortness of breath)   CKD 5, progressed to ESRD with likely uremic symptoms: - Patient presented with volume overload and shortness of breath. -With IV Lasix, but symptoms have resolved. -Patient is currently deemed uremic. -Nephrology input is appreciated.  For PermCath placement.  Vascular surgery consult for AV fistula placement.  Hemodialysis to be initiated on this admission.  Iron studies and CKD MBD management plan.  Dialysis education also planned. -Patient is currently on IV iron. -Leg edema has improved significantly. -Shortness of breath has improved  significantly. -Echocardiogram revealed small pericardial effusion.    Anemia of chronic inflammation/CKD: -Not on IV iron.   -ESA is planned. -Goal hemoglobin of 9.5 to 11.5 g/dL. -We will defer to the nephrology team.  Input is highly appreciated.  History of CVA Hyperlipidemia - Continue home aspirin, Plavix, statin  Hypertension - Continue Coreg and hydralazine -Goal blood pressure should be less than 130/80 mmHg.  Diabetes > On sliding scale long-acting at home. But state she has recently been using on 10U and had some low glucose with this.  Also 10 units with meals. - We will start on SSI only considering recent lows and worsening renal function 09/27/2020: Continue to optimize.   DVT prophylaxis: Subcutaneous heparin Code Status: Full code Family Communication: Daughter Disposition Plan: Home eventually   Consultants:   Nephrology  Procedures:   PermCath placement is planned.  Echocardiogram has been ordered.  Antimicrobials:   None   Subjective: Edema has improved significantly. Shortness of breath has resolved significantly. No chest pain.  Objective: Vitals:   09/27/20 2126 09/28/20 0543 09/28/20 0558 09/28/20 0715  BP: (!) 159/75 (!) 149/73  (!) 149/68  Pulse: 99 98  98  Resp: '18 16  17  '$ Temp: 98.7 F (37.1 C) 99.2 F (37.3 C)  98.6 F (37 C)  TempSrc: Oral Oral  Oral  SpO2: 97% 97%  95%  Weight:   62.6 kg   Height:        Intake/Output Summary (Last 24 hours) at 09/28/2020 1744 Last data filed at 09/28/2020 1623 Gross per 24 hour  Intake 250.76 ml  Output 1150 ml  Net -899.24 ml   Filed Weights   09/26/20 1417 09/28/20 0558  Weight: 63.5  kg 62.6 kg    Examination:  General exam: Appears calm and comfortable  Respiratory system: Clear to auscultation.  Cardiovascular system: S1 & S2 heard Gastrointestinal system: Abdomen is nondistended, soft and nontender. No organomegaly or masses felt. Normal bowel sounds heard. Central  nervous system: Alert and oriented.  Patient seems to move all extremities. Extremities: Mild leg edema.  Data Reviewed: I have personally reviewed following labs and imaging studies  CBC: Recent Labs  Lab 09/26/20 2127 09/27/20 0330  WBC 7.0 6.3  NEUTROABS 4.6  --   HGB 8.2* 8.0*  HCT 25.5* 24.9*  MCV 87.0 86.8  PLT 222 [id number]   Basic Metabolic Panel: Recent Labs  Lab 09/26/20 1731 09/27/20 0330 09/28/20 0239  NA 137 138 139  K 4.1 3.8 3.7  CL 106 106 103  CO2 '23 22 26  '$ GLUCOSE 175* 116* 167*  BUN 37* 32* 32*  CREATININE 5.34* 5.45* 5.45*  CALCIUM 8.8* 8.7* 8.9  MG 2.1  --   --   PHOS 4.2 4.4 4.9*   GFR: Estimated Creatinine Clearance: 10 mL/min (A) (by C-G formula based on SCr of 5.45 mg/dL (H)). Liver Function Tests: Recent Labs  Lab 09/27/20 0330 09/28/20 0239  ALBUMIN 2.7* 2.5*   No results for input(s): LIPASE, AMYLASE in the last 168 hours. No results for input(s): AMMONIA in the last 168 hours. Coagulation Profile: No results for input(s): INR, PROTIME in the last 168 hours. Cardiac Enzymes: No results for input(s): CKTOTAL, CKMB, CKMBINDEX, TROPONINI in the last 168 hours. BNP (last 3 results) No results for input(s): PROBNP in the last 8760 hours. HbA1C: No results for input(s): HGBA1C in the last 72 hours. CBG: Recent Labs  Lab 09/27/20 1633 09/27/20 2023 09/28/20 0644 09/28/20 1104 09/28/20 1608  GLUCAP 175* 200* 160* 162* 167*   Lipid Profile: No results for input(s): CHOL, HDL, LDLCALC, TRIG, CHOLHDL, LDLDIRECT in the last 72 hours. Thyroid Function Tests: No results for input(s): TSH, T4TOTAL, FREET4, T3FREE, THYROIDAB in the last 72 hours. Anemia Panel: Recent Labs    09/28/20 0239  FERRITIN 23  TIBC 199*  IRON 37   Urine analysis:    Component Value Date/Time   COLORURINE STRAW (A) 09/12/2020 2208   APPEARANCEUR CLEAR 09/12/2020 2208   LABSPEC 1.007 09/12/2020 2208   PHURINE 6.0 09/12/2020 2208   GLUCOSEU >=500 (A)  09/12/2020 2208   HGBUR NEGATIVE 09/12/2020 [place] 09/12/2020 2208   KETONESUR NEGATIVE 09/12/2020 2208   PROTEINUR >=300 (A) 09/12/2020 2208   NITRITE NEGATIVE 09/12/2020 2208   LEUKOCYTESUR NEGATIVE 09/12/2020 2208   Sepsis Labs: '@LABRCNTIP'$ (procalcitonin:4,lacticidven:4)  ) Recent Results (from the past 240 hour(s))  Resp Panel by RT-PCR (Flu A&B, Covid) Nasopharyngeal Swab     Status: None   Collection Time: 09/26/20  9:27 PM   Specimen: Nasopharyngeal Swab; Nasopharyngeal(NP) swabs in vial transport medium  Result Value Ref Range Status   SARS Coronavirus 2 by RT PCR NEGATIVE NEGATIVE Final    Comment: (NOTE) SARS-CoV-2 target nucleic acids are NOT DETECTED.  The SARS-CoV-2 RNA is generally detectable in upper respiratory specimens during the acute phase of infection. The lowest concentration of SARS-CoV-2 viral copies this assay can detect is 138 copies/mL. A negative result does not preclude SARS-Cov-2 infection and should not be used as the sole basis for treatment or other patient management decisions. A negative result may occur with  improper specimen collection/handling, submission of specimen other than nasopharyngeal swab, presence of viral mutation(s) within  the areas targeted by this assay, and inadequate number of viral copies(<138 copies/mL). A negative result must be combined with clinical observations, patient history, and epidemiological information. The expected result is Negative.  Fact Sheet for Patients:  EntrepreneurPulse.com.au  Fact Sheet for Healthcare Providers:  IncredibleEmployment.be  This test is no t yet approved or cleared by the [place] FDA and  has been authorized for detection and/or diagnosis of SARS-CoV-2 by FDA under an Emergency Use Authorization (EUA). This EUA will remain  in effect (meaning this test can be used) for the duration of the COVID-19 declaration under  Section 564(b)(1) of the Act, 21 U.S.C.section 360bbb-3(b)(1), unless the authorization is terminated  or revoked sooner.       Influenza A by PCR NEGATIVE NEGATIVE Final   Influenza B by PCR NEGATIVE NEGATIVE Final    Comment: (NOTE) The Xpert Xpress SARS-CoV-2/FLU/RSV plus assay is intended as an aid in the diagnosis of influenza from Nasopharyngeal swab specimens and should not be used as a sole basis for treatment. Nasal washings and aspirates are unacceptable for Xpert Xpress SARS-CoV-2/FLU/RSV testing.  Fact Sheet for Patients: EntrepreneurPulse.com.au  Fact Sheet for Healthcare Providers: IncredibleEmployment.be  This test is not yet approved or cleared by the [place] FDA and has been authorized for detection and/or diagnosis of SARS-CoV-2 by FDA under an Emergency Use Authorization (EUA). This EUA will remain in effect (meaning this test can be used) for the duration of the COVID-19 declaration under Section 564(b)(1) of the Act, 21 U.S.C. section 360bbb-3(b)(1), unless the authorization is terminated or revoked.  Performed at [facility] [address], [place] [address] [place], [place] [address]          Radiology Studies: ECHOCARDIOGRAM COMPLETE  Result Date: 09/27/2020    ECHOCARDIOGRAM REPORT   Patient Name:   JAHNELL KOWALICK Date of Exam: 09/27/2020 Medical Rec #:  [id number]    Height:       62.0 in Accession #:    [id number]   Weight:       140.0 lb Date of Birth:  [birth year]   BSA:          1.643 m Patient Age:    28 years     BP:           143/62 mmHg Patient Gender: F            HR:           86 bpm. Exam Location:  Inpatient Procedure: 2D Echo, Cardiac Doppler and Color Doppler Indications:    Pericardial Effusion  History:        Patient has prior history of Echocardiogram examinations, most                 recent 06/11/2020.  Sonographer:    Merrie Roof RDCS Referring Phys: [place]  1.  Left ventricular ejection fraction, by estimation, is 70 to 75%. The left ventricle has hyperdynamic function. The left ventricle has no regional wall motion abnormalities. Left ventricular diastolic function could not be evaluated.  2. Right ventricular systolic function is normal. The right ventricular size is normal. There is mildly elevated pulmonary artery systolic pressure. The estimated right ventricular systolic pressure is 123456 mmHg.  3. A small pericardial effusion is present. The pericardial effusion is circumferential. There is no evidence of cardiac tamponade.  4. The mitral valve is normal in structure. No evidence of mitral valve regurgitation. No evidence of mitral stenosis.  5. The  aortic valve is tricuspid. Aortic valve regurgitation is not visualized. Mild aortic valve sclerosis is present, with no evidence of aortic valve stenosis.  6. The inferior vena cava is normal in size with greater than 50% respiratory variability, suggesting right atrial pressure of 3 mmHg. FINDINGS  Left Ventricle: Left ventricular ejection fraction, by estimation, is 70 to 75%. The left ventricle has hyperdynamic function. The left ventricle has no regional wall motion abnormalities. The left ventricular internal cavity size was normal in size. There is no left ventricular hypertrophy. Left ventricular diastolic function could not be evaluated. Right Ventricle: The right ventricular size is normal. No increase in right ventricular wall thickness. Right ventricular systolic function is normal. There is mildly elevated pulmonary artery systolic pressure. The tricuspid regurgitant velocity is 2.94  m/s, and with an assumed right atrial pressure of 3 mmHg, the estimated right ventricular systolic pressure is 123456 mmHg. Left Atrium: Left atrial size was normal in size. Right Atrium: Right atrial size was normal in size. Pericardium: A small pericardial effusion is present. The pericardial effusion is circumferential. There  is no evidence of cardiac tamponade. Mitral Valve: The mitral valve is normal in structure. No evidence of mitral valve regurgitation. No evidence of mitral valve stenosis. Tricuspid Valve: The tricuspid valve is normal in structure. Tricuspid valve regurgitation is trivial. No evidence of tricuspid stenosis. Aortic Valve: The aortic valve is tricuspid. Aortic valve regurgitation is not visualized. Mild aortic valve sclerosis is present, with no evidence of aortic valve stenosis. Aortic valve mean gradient measures 8.0 mmHg. Aortic valve peak gradient measures 13.8 mmHg. Aortic valve area, by VTI measures 1.96 cm. Pulmonic Valve: The pulmonic valve was normal in structure. Pulmonic valve regurgitation is not visualized. No evidence of pulmonic stenosis. Aorta: The aortic root is normal in size and structure. Venous: The inferior vena cava is normal in size with greater than 50% respiratory variability, suggesting right atrial pressure of 3 mmHg. IAS/Shunts: No atrial level shunt detected by color flow Doppler.  LEFT VENTRICLE PLAX 2D LVIDd:         4.10 cm  Diastology LVIDs:         2.30 cm  LV e' medial:  6.96 cm/s LV PW:         1.30 cm  LV e' lateral: 8.49 cm/s LV IVS:        1.00 cm LVOT diam:     1.90 cm LV SV:         66 LV SV Index:   40 LVOT Area:     2.84 cm  RIGHT VENTRICLE          IVC RV Basal diam:  3.10 cm  IVC diam: 1.20 cm LEFT ATRIUM             Index       RIGHT ATRIUM           Index LA diam:        3.50 cm 2.13 cm/m  RA Area:     15.10 cm LA Vol (A2C):   63.2 ml 38.47 ml/m RA Volume:   37.60 ml  22.89 ml/m LA Vol (A4C):   50.5 ml 30.74 ml/m LA Biplane Vol: 57.1 ml 34.76 ml/m  AORTIC VALVE AV Area (Vmax):    2.06 cm AV Area (Vmean):   1.99 cm AV Area (VTI):     1.96 cm AV Vmax:           186.00 cm/s AV Vmean:  128.000 cm/s AV VTI:            0.338 m AV Peak Grad:      13.8 mmHg AV Mean Grad:      8.0 mmHg LVOT Vmax:         135.00 cm/s LVOT Vmean:        90.000 cm/s LVOT VTI:           0.233 m LVOT/AV VTI ratio: 0.69  AORTA Ao Root diam: 3.00 cm Ao Asc diam:  2.55 cm TRICUSPID VALVE TR Peak grad:   34.6 mmHg TR Vmax:        294.00 cm/s  SHUNTS Systemic VTI:  0.23 m Systemic Diam: 1.90 cm Fransico Him MD Electronically signed by Fransico Him MD Signature Date/Time: 09/27/2020/6:32:09 PM    Final         Scheduled Meds: . amLODipine  10 mg Oral Daily  . [START ON 09/29/2020]  ceFAZolin (ANCEF) IV  1 g Intravenous to XRAY  . Chlorhexidine Gluconate Cloth  6 each Topical Q0600  . [START ON 09/29/2020] darbepoetin (ARANESP) injection - DIALYSIS  150 mcg Intravenous Q Mon-HD  . famotidine  20 mg Oral Daily  . FLUoxetine  20 mg Oral Daily  . furosemide  80 mg Intravenous Daily  . heparin  5,000 Units Subcutaneous Q8H  . hydrALAZINE  50 mg Oral Q8H  . insulin aspart  0-6 Units Subcutaneous TID WC  . melatonin  3 mg Oral QHS  . metoCLOPramide  5 mg Oral BID  . rosuvastatin  10 mg Oral Daily  . sodium chloride flush  3 mL Intravenous Q12H   Continuous Infusions: . sodium chloride Stopped (09/28/20 1354)  . ferric gluconate (FERRLECIT/NULECIT) IV Stopped (09/28/20 1308)     LOS: 1 day    Time spent: 35 minutes.    Dana Allan, MD  [facility] Pager #: [phone] 7PM-7AM contact night coverage as above

## 2020-09-29 ENCOUNTER — Inpatient Hospital Stay (HOSPITAL_COMMUNITY): Payer: Medicare Other

## 2020-09-29 DIAGNOSIS — N185 Chronic kidney disease, stage 5: Secondary | ICD-10-CM

## 2020-09-29 DIAGNOSIS — N186 End stage renal disease: Secondary | ICD-10-CM | POA: Diagnosis not present

## 2020-09-29 DIAGNOSIS — Z8673 Personal history of transient ischemic attack (TIA), and cerebral infarction without residual deficits: Secondary | ICD-10-CM | POA: Diagnosis not present

## 2020-09-29 DIAGNOSIS — I10 Essential (primary) hypertension: Secondary | ICD-10-CM | POA: Diagnosis not present

## 2020-09-29 DIAGNOSIS — E8779 Other fluid overload: Secondary | ICD-10-CM | POA: Diagnosis not present

## 2020-09-29 HISTORY — PX: IR US GUIDE VASC ACCESS RIGHT: IMG2390

## 2020-09-29 HISTORY — PX: IR FLUORO GUIDE CV LINE RIGHT: IMG2283

## 2020-09-29 LAB — RENAL FUNCTION PANEL
Albumin: 2.5 g/dL — ABNORMAL LOW (ref 3.5–5.0)
Anion gap: 8 (ref 5–15)
BUN: 32 mg/dL — ABNORMAL HIGH (ref 6–20)
CO2: 27 mmol/L (ref 22–32)
Calcium: 8.5 mg/dL — ABNORMAL LOW (ref 8.9–10.3)
Chloride: 101 mmol/L (ref 98–111)
Creatinine, Ser: 5.78 mg/dL — ABNORMAL HIGH (ref 0.44–1.00)
GFR, Estimated: 8 mL/min — ABNORMAL LOW (ref >60)
Glucose, Bld: 225 mg/dL — ABNORMAL HIGH (ref 70–99)
Phosphorus: 4.9 mg/dL — ABNORMAL HIGH (ref 2.5–4.6)
Potassium: 3.7 mmol/L (ref 3.5–5.1)
Sodium: 136 mmol/L (ref 135–145)

## 2020-09-29 LAB — HEPATITIS B SURFACE ANTIGEN: Hepatitis B Surface Ag: NONREACTIVE

## 2020-09-29 LAB — GLUCOSE, CAPILLARY
Glucose-Capillary: 159 mg/dL — ABNORMAL HIGH (ref 70–99)
Glucose-Capillary: 161 mg/dL — ABNORMAL HIGH (ref 70–99)
Glucose-Capillary: 148 mg/dL — ABNORMAL HIGH (ref 70–99)
Glucose-Capillary: 100 mg/dL — ABNORMAL HIGH (ref 70–99)

## 2020-09-29 LAB — CBC
HCT: 26.2 % — ABNORMAL LOW (ref 36.0–46.0)
Hemoglobin: 8.4 g/dL — ABNORMAL LOW (ref 12.0–15.0)
MCH: 28.1 pg (ref 26.0–34.0)
MCHC: 32.1 g/dL (ref 30.0–36.0)
MCV: 87.6 fL (ref 80.0–100.0)
Platelets: 218 10*3/uL (ref 150–400)
RBC: 2.99 MIL/uL — ABNORMAL LOW (ref 3.87–5.11)
RDW: 13.9 % (ref 11.5–15.5)
WBC: 6.6 10*3/uL (ref 4.0–10.5)
nRBC: 0 % (ref 0.0–0.2)

## 2020-09-29 LAB — HEPATITIS B CORE ANTIBODY, IGM: Hep B C IgM: NONREACTIVE

## 2020-09-29 LAB — HEPATITIS B SURFACE ANTIBODY,QUALITATIVE: Hep B S Ab: REACTIVE — AB

## 2020-09-29 LAB — PARATHYROID HORMONE, INTACT (NO CA): PTH: 84 pg/mL — ABNORMAL HIGH (ref 15–65)

## 2020-09-29 MED ORDER — LIDOCAINE-EPINEPHRINE 1 %-1:100000 IJ SOLN
INTRAMUSCULAR | Status: AC
  Filled 2020-09-29: qty 1

## 2020-09-29 MED ORDER — SODIUM CHLORIDE 0.9 % IV SOLN: 100.0000 mL | INTRAVENOUS | Status: DC | PRN

## 2020-09-29 MED ORDER — HEPARIN SODIUM (PORCINE) 1000 UNIT/ML IJ SOLN
INTRAMUSCULAR | Status: AC
  Administered 2020-09-29: 3.2 mL
  Filled 2020-09-29: qty 1

## 2020-09-29 MED ORDER — MIDAZOLAM HCL 2 MG/2ML IJ SOLN
INTRAMUSCULAR | Status: AC
  Filled 2020-09-29: qty 2

## 2020-09-29 MED ORDER — LIDOCAINE-EPINEPHRINE 1 %-1:100000 IJ SOLN
INTRAMUSCULAR | Status: AC | PRN
  Administered 2020-09-29: 5 mL

## 2020-09-29 MED ORDER — FENTANYL CITRATE (PF) 100 MCG/2ML IJ SOLN
INTRAMUSCULAR | Status: AC
  Filled 2020-09-29: qty 2

## 2020-09-29 MED ORDER — PENTAFLUOROPROP-TETRAFLUOROETH EX AERO: 1.0000 "application " | INHALATION_SPRAY | CUTANEOUS | Status: DC | PRN

## 2020-09-29 MED ORDER — LIDOCAINE HCL (PF) 1 % IJ SOLN
5.0000 mL | INTRAMUSCULAR | Status: DC | PRN
  Filled 2020-09-29: qty 5

## 2020-09-29 MED ORDER — LIDOCAINE HCL 1 % IJ SOLN
INTRAMUSCULAR | Status: AC | PRN
  Administered 2020-09-29: 10 mL

## 2020-09-29 MED ORDER — CEFAZOLIN SODIUM-DEXTROSE 2-4 GM/100ML-% IV SOLN
INTRAVENOUS | Status: AC
  Filled 2020-09-29: qty 100

## 2020-09-29 MED ORDER — DARBEPOETIN ALFA 150 MCG/0.3ML IJ SOSY
PREFILLED_SYRINGE | INTRAMUSCULAR | Status: AC
  Administered 2020-09-29: 150 ug via INTRAVENOUS
  Filled 2020-09-29: qty 0.3

## 2020-09-29 MED ORDER — HEPARIN SODIUM (PORCINE) 1000 UNIT/ML DIALYSIS: 1000.0000 [IU] | INTRAMUSCULAR | Status: DC | PRN

## 2020-09-29 MED ORDER — HYDROCODONE-ACETAMINOPHEN 5-325 MG PO TABS
1.0000 | ORAL_TABLET | Freq: Four times a day (QID) | ORAL | Status: DC | PRN
  Administered 2020-09-29 – 2020-09-30 (×4): 1 via ORAL
  Administered 2020-10-01 (×2): 2 via ORAL
  Administered 2020-10-01 – 2020-10-02 (×2): 1 via ORAL
  Administered 2020-10-02 – 2020-10-04 (×4): 2 via ORAL
  Administered 2020-10-04: 1 via ORAL
  Administered 2020-10-04 (×2): 2 via ORAL
  Administered 2020-10-04 – 2020-10-05 (×3): 1 via ORAL
  Administered 2020-10-06: 2 via ORAL
  Filled 2020-09-29 (×3): qty 2
  Filled 2020-09-29 (×3): qty 1
  Filled 2020-09-29: qty 2
  Filled 2020-09-29: qty 1
  Filled 2020-09-29 (×4): qty 2
  Filled 2020-09-29: qty 1
  Filled 2020-09-29 (×3): qty 2
  Filled 2020-09-29: qty 1
  Filled 2020-09-29: qty 2
  Filled 2020-09-29 (×2): qty 1
  Filled 2020-09-29 (×3): qty 2

## 2020-09-29 MED ORDER — ALTEPLASE 2 MG IJ SOLR: 2.0000 mg | Freq: Once | INTRAMUSCULAR | Status: DC | PRN

## 2020-09-29 MED ORDER — LIDOCAINE-PRILOCAINE 2.5-2.5 % EX CREA
1.0000 "application " | TOPICAL_CREAM | CUTANEOUS | Status: DC | PRN
  Filled 2020-09-29: qty 5

## 2020-09-29 MED ORDER — GELATIN ABSORBABLE 12-7 MM EX MISC
CUTANEOUS | Status: AC
  Filled 2020-09-29: qty 1

## 2020-09-29 MED ORDER — INSULIN GLARGINE 100 UNIT/ML ~~LOC~~ SOLN
10.0000 [IU] | Freq: Every day | SUBCUTANEOUS | Status: DC
  Administered 2020-09-29 – 2020-10-07 (×8): 10 [IU] via SUBCUTANEOUS
  Filled 2020-09-29 (×11): qty 0.1

## 2020-09-29 MED ORDER — MIDAZOLAM HCL 2 MG/2ML IJ SOLN
INTRAMUSCULAR | Status: AC | PRN
  Administered 2020-09-29: 1 mg via INTRAVENOUS

## 2020-09-29 MED ORDER — LIDOCAINE HCL (PF) 1 % IJ SOLN
INTRAMUSCULAR | Status: AC
  Filled 2020-09-29: qty 30

## 2020-09-29 MED ORDER — HEPARIN SODIUM (PORCINE) 1000 UNIT/ML IJ SOLN
INTRAMUSCULAR | Status: AC
  Administered 2020-09-29: 3200 [IU] via INTRAVENOUS_CENTRAL
  Filled 2020-09-29: qty 4

## 2020-09-29 MED ORDER — HEPARIN SODIUM (PORCINE) 5000 UNIT/ML IJ SOLN
5000.0000 [IU] | Freq: Three times a day (TID) | INTRAMUSCULAR | Status: DC
  Administered 2020-09-30 – 2020-10-06 (×16): 5000 [IU] via SUBCUTANEOUS
  Filled 2020-09-29 (×18): qty 1

## 2020-09-29 MED ORDER — FENTANYL CITRATE (PF) 100 MCG/2ML IJ SOLN
INTRAMUSCULAR | Status: AC | PRN
  Administered 2020-09-29: 25 ug via INTRAVENOUS

## 2020-09-29 NOTE — Consult Note (Signed)
   [facility] Inpatient Consult   09/29/2020  Zura Lancto 1966-02-13 [id number]   [place] Organization [ACO] Patient: [facility]  Primary Care Provider:  Dr. Ulanda Edison, [facility] Physician and this provider's office is listed to do the [facility] follow up calls and appointments.  Patient is currently active with [facility] for chronic disease management services.  Patient has been engaged by a [facility]..  Our community based plan of care has focused on disease management and community resource support.    Plan:  Will follow up with Inpatient Transition Of Care [TOC] team member to make aware that [facility] following. Follow patient for progress and disposition needs.  Of note, [facility] Care Management services does not replace or interfere with any services that are needed or arranged by inpatient [facility] care management team.  For additional questions or referrals please contact:  Natividad Brood, RN BSN [facility] Liaison  [phone] business mobile phone Toll free office [phone]  Fax number: [phone] [email or web address]

## 2020-09-29 NOTE — Procedures (Signed)
Interventional Radiology Procedure:   Indications: Chronic kidney disease with uremic symptoms  Procedure: Tunneled dialysis catheter placement  Findings: Right jugular Palindrome, tip at SVC/RA junction  Complications: None     EBL: less than 10 ml  Plan: Catheter is ready to use.     Anastasio Wogan R. Anselm Pancoast, MD  Pager: [phone]

## 2020-09-29 NOTE — Progress Notes (Signed)
Subjective:  Patient feels well today with minimal complaints. NPO for catheter placement.  Objective Vital signs in last 24 hours: Vitals:   09/29/20 1425 09/29/20 1430 09/29/20 1435 09/29/20 1458  BP: (!) 184/83 (!) 180/84 (!) 168/81 (!) 172/76  Pulse: (!) 107 (!) 108 (!) 105 (!) 104  Resp: 14 (!) 23 12   Temp:    97.7 F (36.5 C)  TempSrc:    Oral  SpO2: 97% 96% 97% 93%  Weight:      Height:       Weight change:   Intake/Output Summary (Last 24 hours) at 09/29/2020 1541 Last data filed at 09/29/2020 1006 Gross per 24 hour  Intake 127.76 ml  Output 1500 ml  Net -1372.24 ml    Assessment/Plan: [name] with DM and HTN with progressive CKD and now what appears to be uremic symptoms.  My recommendation would be to initiate dialysis this admission 1.Renal- advanced CKD that has been longstanding now felt to be ESRD-  Has now progressed to the point where she is uremic.  The most efficient way to get her better is to start dialysis.   Will need education regarding that and possibly starting at the TCU as an OP.  Will get our education team involved as well. IR consulted and placing TDC today. Appreciate help. Plan for HD today and tomorrow after access. Consulting [facility] today to help with perm access. 2. Hypertension/volume  - BP high and she is volume overloaded.   challenging with high dose lasix until can get dialysis started-  Good results. Dialysis today and tomorrow 3. Anemia  - is quite anemic-   iron stores low, will replete and now on ESA 128mg weekly. 4. Bones-  PTH 84. No treatment needed 5. Acidosis- Resolved    [name]   Labs: Basic Metabolic Panel: Recent Labs  Lab 09/27/20 0330 09/28/20 0239 09/29/20 0037  NA 138 139 136  K 3.8 3.7 3.7  CL 106 103 101  CO2 '22 26 27  '$ GLUCOSE 116* 167* 225*  BUN 32* 32* 32*  CREATININE 5.45* 5.45* 5.78*  CALCIUM 8.7* 8.9 8.5*  PHOS 4.4 4.9* 4.9*   Liver Function Tests: Recent Labs  Lab 09/27/20 0330  09/28/20 0239 09/29/20 0037  ALBUMIN 2.7* 2.5* 2.5*   No results for input(s): LIPASE, AMYLASE in the last 168 hours. No results for input(s): AMMONIA in the last 168 hours. CBC: Recent Labs  Lab 09/26/20 2127 09/27/20 0330  WBC 7.0 6.3  NEUTROABS 4.6  --   HGB 8.2* 8.0*  HCT 25.5* 24.9*  MCV 87.0 86.8  PLT 222 213   Cardiac Enzymes: No results for input(s): CKTOTAL, CKMB, CKMBINDEX, TROPONINI in the last 168 hours. CBG: Recent Labs  Lab 09/28/20 1104 09/28/20 1608 09/28/20 1932 09/29/20 0604 09/29/20 1136  GLUCAP 162* 167* 173* 159* 161*    Iron Studies:  Recent Labs    09/28/20 0239  IRON 37  TIBC 199*  FERRITIN 23   Studies/Results: ECHOCARDIOGRAM COMPLETE  Result Date: 09/27/2020    ECHOCARDIOGRAM REPORT   Patient Name:   Hannah SHOLTISDate of Exam: 09/27/2020 Medical Rec #:  [id number]   Height:       62.0 in Accession #:    [id number]  Weight:       140.0 lb Date of Birth:  [birth year]  BSA:          1.643 m Patient Age:    89  years     BP:           143/62 mmHg Patient Gender: F            HR:           86 bpm. Exam Location:  Inpatient Procedure: 2D Echo, Cardiac Doppler and Color Doppler Indications:    Pericardial Effusion  History:        Patient has prior history of Echocardiogram examinations, most                 recent 06/11/2020.  Sonographer:    Merrie Roof RDCS Referring Phys: [place]  1. Left ventricular ejection fraction, by estimation, is 70 to 75%. The left ventricle has hyperdynamic function. The left ventricle has no regional wall motion abnormalities. Left ventricular diastolic function could not be evaluated.  2. Right ventricular systolic function is normal. The right ventricular size is normal. There is mildly elevated pulmonary artery systolic pressure. The estimated right ventricular systolic pressure is 123456 mmHg.  3. A small pericardial effusion is present. The pericardial effusion is circumferential. There is no  evidence of cardiac tamponade.  4. The mitral valve is normal in structure. No evidence of mitral valve regurgitation. No evidence of mitral stenosis.  5. The aortic valve is tricuspid. Aortic valve regurgitation is not visualized. Mild aortic valve sclerosis is present, with no evidence of aortic valve stenosis.  6. The inferior vena cava is normal in size with greater than 50% respiratory variability, suggesting right atrial pressure of 3 mmHg. FINDINGS  Left Ventricle: Left ventricular ejection fraction, by estimation, is 70 to 75%. The left ventricle has hyperdynamic function. The left ventricle has no regional wall motion abnormalities. The left ventricular internal cavity size was normal in size. There is no left ventricular hypertrophy. Left ventricular diastolic function could not be evaluated. Right Ventricle: The right ventricular size is normal. No increase in right ventricular wall thickness. Right ventricular systolic function is normal. There is mildly elevated pulmonary artery systolic pressure. The tricuspid regurgitant velocity is 2.94  m/s, and with an assumed right atrial pressure of 3 mmHg, the estimated right ventricular systolic pressure is 123456 mmHg. Left Atrium: Left atrial size was normal in size. Right Atrium: Right atrial size was normal in size. Pericardium: A small pericardial effusion is present. The pericardial effusion is circumferential. There is no evidence of cardiac tamponade. Mitral Valve: The mitral valve is normal in structure. No evidence of mitral valve regurgitation. No evidence of mitral valve stenosis. Tricuspid Valve: The tricuspid valve is normal in structure. Tricuspid valve regurgitation is trivial. No evidence of tricuspid stenosis. Aortic Valve: The aortic valve is tricuspid. Aortic valve regurgitation is not visualized. Mild aortic valve sclerosis is present, with no evidence of aortic valve stenosis. Aortic valve mean gradient measures 8.0 mmHg. Aortic valve peak  gradient measures 13.8 mmHg. Aortic valve area, by VTI measures 1.96 cm. Pulmonic Valve: The pulmonic valve was normal in structure. Pulmonic valve regurgitation is not visualized. No evidence of pulmonic stenosis. Aorta: The aortic root is normal in size and structure. Venous: The inferior vena cava is normal in size with greater than 50% respiratory variability, suggesting right atrial pressure of 3 mmHg. IAS/Shunts: No atrial level shunt detected by color flow Doppler.  LEFT VENTRICLE PLAX 2D LVIDd:         4.10 cm  Diastology LVIDs:         2.30 cm  LV e' medial:  6.96 cm/s LV PW:         1.30 cm  LV e' lateral: 8.49 cm/s LV IVS:        1.00 cm LVOT diam:     1.90 cm LV SV:         66 LV SV Index:   40 LVOT Area:     2.84 cm  RIGHT VENTRICLE          IVC RV Basal diam:  3.10 cm  IVC diam: 1.20 cm LEFT ATRIUM             Index       RIGHT ATRIUM           Index LA diam:        3.50 cm 2.13 cm/m  RA Area:     15.10 cm LA Vol (A2C):   63.2 ml 38.47 ml/m RA Volume:   37.60 ml  22.89 ml/m LA Vol (A4C):   50.5 ml 30.74 ml/m LA Biplane Vol: 57.1 ml 34.76 ml/m  AORTIC VALVE AV Area (Vmax):    2.06 cm AV Area (Vmean):   1.99 cm AV Area (VTI):     1.96 cm AV Vmax:           186.00 cm/s AV Vmean:          128.000 cm/s AV VTI:            0.338 m AV Peak Grad:      13.8 mmHg AV Mean Grad:      8.0 mmHg LVOT Vmax:         135.00 cm/s LVOT Vmean:        90.000 cm/s LVOT VTI:          0.233 m LVOT/AV VTI ratio: 0.69  AORTA Ao Root diam: 3.00 cm Ao Asc diam:  2.55 cm TRICUSPID VALVE TR Peak grad:   34.6 mmHg TR Vmax:        294.00 cm/s  SHUNTS Systemic VTI:  0.23 m Systemic Diam: 1.90 cm Fransico Him MD Electronically signed by Fransico Him MD Signature Date/Time: 09/27/2020/6:32:09 PM    Final    VAS [place] UPPER EXT VEIN MAPPING (PRE-OP AVF)  Result Date: 09/29/2020 [place] MAPPING Patient Name:  ANIYIA RHODD  Date of Exam:   09/29/2020 Medical Rec #: [id number]     Accession #:    [id number] Date of  Birth: 19-Jan-1966    Patient Gender: F Patient Age:   54Y Exam Location:  [facility] Procedure:      VAS [place] UPPER EXT VEIN MAPPING (PRE-OP AVF) Referring Phys: [id number] KELLIE GOL[address]BOROUGH --------------------------------------------------------------------------------  Indications: Pre-access. Comparison Study: no prior Performing Technologist: Abram Sander RVS  Examination Guidelines: A complete evaluation includes B-mode imaging, spectral Doppler, color Doppler, and power Doppler as needed of all accessible portions of each vessel. Bilateral testing is considered an integral part of a complete examination. Limited examinations for reoccurring indications may be performed as noted. +-----------------+-------------+----------+--------+ Right Cephalic   Diameter (cm)Depth (cm)Findings +-----------------+-------------+----------+--------+ Shoulder             0.22        1.35            +-----------------+-------------+----------+--------+ Prox upper arm       0.20        1.28            +-----------------+-------------+----------+--------+ Mid upper arm        0.21  1.37            +-----------------+-------------+----------+--------+ Dist upper arm       0.10        0.47            +-----------------+-------------+----------+--------+ Antecubital fossa    0.16        0.25            +-----------------+-------------+----------+--------+ Prox forearm         0.21        0.82            +-----------------+-------------+----------+--------+ Mid forearm          0.22        0.76            +-----------------+-------------+----------+--------+ Dist forearm         0.22        0.38            +-----------------+-------------+----------+--------+ Wrist                0.23        0.32            +-----------------+-------------+----------+--------+ +-----------------+-------------+----------+--------+ Right Basilic    Diameter (cm)Depth (cm)Findings  +-----------------+-------------+----------+--------+ Prox upper arm       0.35        0.99            +-----------------+-------------+----------+--------+ Mid upper arm        0.27        1.19            +-----------------+-------------+----------+--------+ Dist upper arm       0.29        1.26            +-----------------+-------------+----------+--------+ Antecubital fossa    0.21        1.03            +-----------------+-------------+----------+--------+ Prox forearm         0.21        0.76            +-----------------+-------------+----------+--------+ Mid forearm          0.20        0.46            +-----------------+-------------+----------+--------+ Distal forearm       0.21        0.36            +-----------------+-------------+----------+--------+ Wrist                0.18        0.39            +-----------------+-------------+----------+--------+ +-----------------+-------------+----------+---------+ Left Cephalic    Diameter (cm)Depth (cm)Findings  +-----------------+-------------+----------+---------+ Shoulder             0.31        1.02             +-----------------+-------------+----------+---------+ Prox upper arm       0.24        1.07             +-----------------+-------------+----------+---------+ Mid upper arm        0.13        1.23   branching +-----------------+-------------+----------+---------+ Dist upper arm       0.13        1.05             +-----------------+-------------+----------+---------+ Antecubital fossa    0.20  2.97             +-----------------+-------------+----------+---------+ Prox forearm         0.18        0.65             +-----------------+-------------+----------+---------+ Mid forearm          0.24        0.52             +-----------------+-------------+----------+---------+ Dist forearm         0.22        0.34              +-----------------+-------------+----------+---------+ +-----------------+-------------+----------+---------+ Left Basilic     Diameter (cm)Depth (cm)Findings  +-----------------+-------------+----------+---------+ Prox upper arm       0.38        1.48             +-----------------+-------------+----------+---------+ Mid upper arm        0.25        1.42             +-----------------+-------------+----------+---------+ Dist upper arm       0.27        1.18             +-----------------+-------------+----------+---------+ Antecubital fossa    0.26        0.68             +-----------------+-------------+----------+---------+ Prox forearm         0.22        0.30             +-----------------+-------------+----------+---------+ Mid forearm          0.18        0.23             +-----------------+-------------+----------+---------+ Distal forearm       0.19        0.28             +-----------------+-------------+----------+---------+ Elbow                                   branching +-----------------+-------------+----------+---------+ *See table(s) above for measurements and observations.  Diagnosing physician:    Preliminary    Medications: Infusions: . sodium chloride Stopped (09/28/20 1354)  . ceFAZolin    . ferric gluconate (FERRLECIT/NULECIT) IV 250 mg (09/29/20 [id number])    Scheduled Medications: . amLODipine  10 mg Oral Daily  . Chlorhexidine Gluconate Cloth  6 each Topical Q0600  . darbepoetin (ARANESP) injection - DIALYSIS  150 mcg Intravenous Q Mon-HD  . famotidine  20 mg Oral Daily  . FLUoxetine  20 mg Oral Daily  . furosemide  80 mg Intravenous Daily  . gelatin adsorbable      . [START ON 09/30/2020] heparin  5,000 Units Subcutaneous Q8H  . hydrALAZINE  50 mg Oral Q8H  . insulin aspart  0-6 Units Subcutaneous TID WC  . insulin glargine  10 Units Subcutaneous QHS  . lidocaine (PF)      . lidocaine-EPINEPHrine      . melatonin  3 mg Oral  QHS  . metoCLOPramide  5 mg Oral BID  . rosuvastatin  10 mg Oral Daily  . sodium chloride flush  3 mL Intravenous Q12H    have reviewed scheduled and prn medications.  Physical Exam: General:  NAD-  Lying in bed Heart: RRR Lungs: mostly clear Abdomen: soft, non tender  Extremities: mild edema    09/29/2020,3:41 PM  LOS: 2 days

## 2020-09-29 NOTE — Plan of Care (Addendum)
  Problem: Clinical Measurements: Goal: Will remain free from infection Outcome: Progressing   Problem: Activity: Goal: Risk for activity intolerance will decrease Outcome: Progressing    0621 Pt Bp 169/87, P 109, RR 17. Pt's scheduled hydralazine administered. On call MD, J. Mansy notified.  Pt's pain level 8/10. Pain medication administered. RN will continue to monitor patient.

## 2020-09-29 NOTE — Progress Notes (Signed)
Upper extremity vein mapping has been completed.   Preliminary results in CV Proc.   Abram Sander 09/29/2020 11:30 AM

## 2020-09-29 NOTE — Consult Note (Addendum)
Hospital Consult    Reason for Consult:  Permanent access Requesting Physician:  Dr. Joylene Grapes MRN #:  [id number]  History of Present Illness: This is a 55 y.o. female with past medical history of CVA, DM Type II, HTN, HLD, MS, Anemia, and CKD Stage V who presents with shortness of breath secondary to volume overload with progressive worsening of renal function now felt to be ESRD. Vascular surgery has been consulted for permanent access placement. She had a TDC placed by IR today with plans to initiate Hemodialysis this evening and subsequent session tomorrow. She has no prior history of dialysis. No prior neck surgeries, central lines or access. No prior upper extremity surgeries. She has had prior Strokes with right hemiparesis so she has been left hand dominant for past 10 years.  Past Medical History:  Diagnosis Date  . Cerebral infarction ([place]) 07/12/2017   2009 first one, has had a total of 4 stokes  . Diabetes type 2, controlled ([place])   . Hemiparesis affecting right side as late effect of cerebrovascular accident (CVA) ([place])   . Hyperlipidemia   . Hypertension   . Multiple sclerosis ([place]) 2009  . Stroke [facility])     Past Surgical History:  Procedure Laterality Date  . APPENDECTOMY    . CYSTOSTOMY W/ BLADDER BIOPSY    . OVARIAN CYST SURGERY    . stent placed in heart      Allergies  Allergen Reactions  . Ibuprofen Other (See Comments) and Swelling    Edema Swelling to hands, feet and face   . Influenza Vaccines Anaphylaxis    Swelling, fever  . Pneumococcal Vaccine Anaphylaxis    Swelling, fever Swelling, fever   . Promethazine Other (See Comments)    hallucinations hallucinations   . Ambien [Zolpidem]     Other reaction(s): Unknown  . Cyclobenzaprine Other (See Comments)    hallucinations   . Tape Itching  . Tramadol Hcl     Other reaction(s): hallucinations  . Oxycodone Nausea Only and Nausea And Vomiting    Prior to Admission medications   Medication  Sig Start Date End Date Taking? Authorizing Provider  acetaminophen (TYLENOL) 325 MG tablet Take 2 tablets (650 mg total) by mouth every 4 (four) hours as needed for mild pain (or temp > 37.5 C (99.5 F)). 06/25/20  Yes Angiulli, Lavon Paganini, PA-C  amLODipine (NORVASC) 10 MG tablet Take 1 tablet (10 mg total) by mouth daily. 06/25/20  Yes Angiulli, Lavon Paganini, PA-C  clopidogrel (PLAVIX) 75 MG tablet Take 1 tablet (75 mg total) by mouth daily. 06/25/20  Yes Angiulli, Lavon Paganini, PA-C  diclofenac Sodium (VOLTAREN) 1 % GEL Apply 2 g topically 4 (four) times daily. Patient taking differently: Apply 2 g topically 4 (four) times daily as needed (pain). 06/25/20  Yes Angiulli, Lavon Paganini, PA-C  ergocalciferol (VITAMIN D2) 1.25 MG (50000 UT) capsule Take 50,000 Units by mouth once a week.   Yes [provider]  famotidine (PEPCID) 20 MG tablet Take 1 tablet (20 mg total) by mouth daily. 06/25/20  Yes Angiulli, Lavon Paganini, PA-C  FLUoxetine (PROZAC) 20 MG capsule Take 1 capsule (20 mg total) by mouth daily. 06/25/20  Yes Angiulli, Lavon Paganini, PA-C  glipiZIDE (GLUCOTROL) 10 MG tablet Take 1 tablet (10 mg total) by mouth 2 (two) times daily before a meal. 06/25/20  Yes Angiulli, Lavon Paganini, PA-C  hydrALAZINE (APRESOLINE) 50 MG tablet Take 1 tablet (50 mg total) by mouth every 8 (eight) hours. 09/17/20  Yes  Mercy Riding, MD  insulin degludec (TRESIBA FLEXTOUCH) 100 UNIT/ML FlexTouch Pen Inject 15-45 Units into the skin at bedtime. Sliding scale   Yes [provider]  insulin lispro (INSULIN LISPRO) 100 UNIT/ML KwikPen Junior Inject 10 Units into the skin 3 (three) times daily.   Yes [provider]  melatonin 3 MG TABS tablet Take 1 tablet (3 mg total) by mouth at bedtime. 06/25/20  Yes Angiulli, Lavon Paganini, PA-C  nitroGLYCERIN (NITROSTAT) 0.4 MG SL tablet Place 1 tablet (0.4 mg total) under the tongue every 5 (five) minutes as needed for chest pain. 06/25/20  Yes Angiulli, Lavon Paganini, PA-C  rosuvastatin (CRESTOR) 20 MG  tablet Take 1 tablet (20 mg total) by mouth daily. 06/25/20  Yes Angiulli, Lavon Paganini, PA-C  senna-docusate (SENOKOT-S) 8.6-50 MG tablet Take 1 tablet by mouth 2 (two) times daily as needed for moderate constipation. 09/17/20  Yes Mercy Riding, MD  sodium bicarbonate 650 MG tablet Take 1 tablet (650 mg total) by mouth 3 (three) times daily. 09/17/20  Yes Mercy Riding, MD  tiZANidine (ZANAFLEX) 2 MG tablet Take 1 tablet (2 mg total) by mouth 3 (three) times daily. 06/25/20  Yes Angiulli, Lavon Paganini, PA-C  carvedilol (COREG) 25 MG tablet Take 1 tablet (25 mg total) by mouth 2 (two) times daily with a meal. Patient not taking: No sig reported 09/17/20   Mercy Riding, MD    Social History   Socioeconomic History  . Marital status: Divorced    Spouse name: Not on file  . Number of children: Not on file  . Years of education: Not on file  . Highest education level: Not on file  Occupational History  . Occupation: disabled  Tobacco Use  . Smoking status: Never Smoker  . Smokeless tobacco: Never Used  Vaping Use  . Vaping Use: Never used  Substance and Sexual Activity  . Alcohol use: Not Currently  . Drug use: Never  . Sexual activity: Not on file  Other Topics Concern  . Not on file  Social History Narrative  . Not on file   Social Determinants of Health   Financial Resource Strain: Not on file  Food Insecurity: Not on file  Transportation Needs: Not on file  Physical Activity: Not on file  Stress: Not on file  Social Connections: Not on file  Intimate Partner Violence: Not on file    History reviewed. No pertinent family history.  ROS: Otherwise negative unless mentioned in HPI  Physical Examination  Vitals:   09/29/20 1435 09/29/20 1458  BP: (!) 168/81 (!) 172/76  Pulse: (!) 105 (!) 104  Resp: 12   Temp:  97.7 F (36.5 C)  SpO2: 97% 93%   Body mass index is 25.24 kg/m.  General:  WDWN , in discomfort but no acute distress Gait: Not observed HENT: WNL,  normocephalic Pulmonary: normal non-labored breathing Cardiac: regular, right IJ TDC in place, dressings c/d/i Abdomen: soft, NT/ND, no masses Vascular Exam/Pulses: 2+ radial pulses bilaterally Extremities: without ischemic changes, without Gangrene , without cellulitis; without open wounds;  Musculoskeletal: no muscle wasting or atrophy  Neurologic: A&O X 3;  speech is fluent/normal, right grip strength 3/5, left 5/5, right hemiparesis Psychiatric:  The pt has Normal affect. Lymph:  Unremarkable  CBC    Component Value Date/Time   WBC 6.3 09/27/2020 0330   RBC 2.87 (L) 09/27/2020 0330   HGB 8.0 (L) 09/27/2020 0330   HCT 24.9 (L) 09/27/2020 0330   PLT  213 09/27/2020 0330   MCV 86.8 09/27/2020 0330   MCH 27.9 09/27/2020 0330   MCHC 32.1 09/27/2020 0330   RDW 14.1 09/27/2020 0330   LYMPHSABS 1.3 09/26/2020 2127   MONOABS 0.7 09/26/2020 2127   EOSABS 0.3 09/26/2020 2127   BASOSABS 0.0 09/26/2020 2127    BMET    Component Value Date/Time   NA 136 09/29/2020 0037   K 3.7 09/29/2020 0037   CL 101 09/29/2020 0037   CO2 27 09/29/2020 0037   GLUCOSE 225 (H) 09/29/2020 0037   BUN 32 (H) 09/29/2020 0037   CREATININE 5.78 (H) 09/29/2020 0037   CALCIUM 8.5 (L) 09/29/2020 0037   GFRNONAA 8 (L) 09/29/2020 0037    COAGS: Lab Results  Component Value Date   INR 0.9 06/09/2020     Non-Invasive Vascular Imaging:   VAS [place] Upper Extremity Vein Mapping:  +-----------------+-------------+----------+--------+  Right Cephalic  Diameter (cm)Depth (cm)Findings  +-----------------+-------------+----------+--------+  Shoulder       0.22     1.35        +-----------------+-------------+----------+--------+  Prox upper arm    0.20     1.28        +-----------------+-------------+----------+--------+  Mid upper arm    0.21     1.37        +-----------------+-------------+----------+--------+  Dist upper arm    0.10      0.47        +-----------------+-------------+----------+--------+  Antecubital fossa  0.16     0.25        +-----------------+-------------+----------+--------+  Prox forearm     0.21     0.82        +-----------------+-------------+----------+--------+  Mid forearm     0.22     0.76        +-----------------+-------------+----------+--------+  Dist forearm     0.22     0.38        +-----------------+-------------+----------+--------+  Wrist        0.23     0.32        +-----------------+-------------+----------+--------+   +-----------------+-------------+----------+--------+  Right Basilic  Diameter (cm)Depth (cm)Findings  +-----------------+-------------+----------+--------+  Prox upper arm    0.35     0.99        +-----------------+-------------+----------+--------+  Mid upper arm    0.27     1.19        +-----------------+-------------+----------+--------+  Dist upper arm    0.29     1.26        +-----------------+-------------+----------+--------+  Antecubital fossa  0.21     1.03        +-----------------+-------------+----------+--------+  Prox forearm     0.21     0.76        +-----------------+-------------+----------+--------+  Mid forearm     0.20     0.46        +-----------------+-------------+----------+--------+  Distal forearm    0.21     0.36        +-----------------+-------------+----------+--------+  Wrist        0.18     0.39        +-----------------+-------------+----------+--------+   +-----------------+-------------+----------+---------+  Left Cephalic  Diameter (cm)Depth (cm)Findings   +-----------------+-------------+----------+---------+  Shoulder       0.31     1.02          +-----------------+-------------+----------+---------+  Prox upper arm    0.24     1.07         +-----------------+-------------+----------+---------+  Mid upper arm    0.13  1.23  branching  +-----------------+-------------+----------+---------+  Dist upper arm    0.13     1.05         +-----------------+-------------+----------+---------+  Antecubital fossa  0.20     2.97         +-----------------+-------------+----------+---------+  Prox forearm     0.18     0.65         +-----------------+-------------+----------+---------+  Mid forearm     0.24     0.52         +-----------------+-------------+----------+---------+  Dist forearm     0.22     0.34         +-----------------+-------------+----------+---------+   +-----------------+-------------+----------+---------+  Left Basilic   Diameter (cm)Depth (cm)Findings   +-----------------+-------------+----------+---------+  Prox upper arm    0.38     1.48         +-----------------+-------------+----------+---------+  Mid upper arm    0.25     1.42         +-----------------+-------------+----------+---------+  Dist upper arm    0.27     1.18         +-----------------+-------------+----------+---------+  Antecubital fossa  0.26     0.68         +-----------------+-------------+----------+---------+  Prox forearm     0.22     0.30         +-----------------+-------------+----------+---------+  Mid forearm     0.18     0.23         +-----------------+-------------+----------+---------+  Distal forearm    0.19     0.28         +-----------------+-------------+----------+---------+  Elbow                  branching  +-----------------+-------------+----------+---------+    Statin:  Yes.   Beta Blocker:  Yes.   Aspirin:  No. ACEI:  No. ARB:  No. CCB use:  No Other antiplatelets/anticoagulants:  Yes.   Plavix   ASSESSMENT/PLAN: This is a 55 y.o. female with CKD stage V now progressed to ESRD with need for permanent access. She had a TDC placed today to initiate dialysis. She had bilateral upper extremity vein mapping with marginal basilic veins bilaterally, left greater than right. Would attempt for Basilic Vein fistula vs graft. Due to her right upper extremity hemiparesis she would prefer to have access placed in her right arm versus her left, which has now become her dominant arm. I discussed this with patient and her daughter and answered their questions. The patient was in a lot of discomfort after [facility] placement and also somewhat overwhelmed by everything going on during her current admission. Discussed possibility of permanent access placement tomorrow in the OR, but patient and daughter would like to talk it over and discuss it with her other children prior to proceeding. The on call Vascular surgeon, Dr. Carlis Abbott, will also see patient and discuss surgical management with her. Will plan for permanent access at some point later this week   Karoline Caldwell PA-C [facility] [phone] 09/29/2020  3:52 PM   I have seen and evaluated the patient. I agree with the PA note as documented above.  55 year old female with diabetes, hypertension, history of CVA and underlying stage V CKD admitted with worsening volume overload.  Vascular surgery has been consulted for permanent dialysis access evaluation.  She had a right IJ tunneled catheter placed in IR today with plans to start dialysis this afternoon.  Appears she used to be right-handed but now following her  stroke is dominantly left-handed.  She would prefer access in her right arm.  She has palpable radial and brachial pulses bilateral upper extremities.  Discussed she does not have any good  surface veins and likely would require AV graft placement but can certainly evaluate her basilic vein with ultrasound in the OR.  Risks and benefits were discussed in detail.  Ultimately she is overwhelmed and her family would like more time to think about options and will discuss amongst the children.  We will stop by tomorrow to see if she has any further questions and see how she would best like to proceed.  Marty Heck, MD [facility] [place] Office: [phone]

## 2020-09-29 NOTE — Progress Notes (Signed)
PROGRESS NOTE    Hannah [name]  [id number] DOB: 05-Dec-1965 DOA: 09/26/2020 PCP: Vernie Shanks, MD   Brief Narrative: Hannah [name] is a 55 y.o. female with a history of CKD stage V, chronic anemia, history of CVA, diabetes mellitus type 2, hypertension, hyperlipidemia, multiple sclerosis.  Patient presented secondary to shortness of breath and lower extremity swelling with evidence of fluid overload on admission.  Patient had associated worsening kidney disease consistent with volume overload from renal failure.  Patient started on IV Lasix diuresis.  Nephrology consulted for concern of uremia symptoms with associated worsening kidney disease.  Recommendation to start hemodialysis.   Assessment & Plan:   Principal Problem:   Volume overload Active Problems:   Multiple sclerosis (HCC)   Hyperlipidemia   Uncontrolled type 2 diabetes mellitus with hyperglycemia (HCC)   Essential hypertension   H/O: CVA (cerebrovascular accident)   CKD (chronic kidney disease) stage 5, GFR less than 15 ml/min (HCC)   SOB (shortness of breath)   Volume overload In setting of worsening CKD with progression to ESRD. Nephology consulted. Lasix IV started with improvement of symptoms. Patient to start HD per nephrology  ESRD Uremia Patient presented with volume overload as mentioned above. Progression from CKD V. Nephrology initiating dialysis for continued management of kidney disease -Nephrology recommendations: Lasix IV, IR for [facility], pending today; plan for home HD  Primary hypertension Patient is prescribed amlodipine, hydralazine, and Coreg (not taking) as an outpatient. Blood pressure elevated, likely complicated by fluid overload. -Continue amlodipine, hydralazine -Lasix for fluid management as mentioned above  Diabetes mellitus, type 2 Hemoglobin A1C of 8.7% from 08/2020. Patient is on Tresiba 15-45 units, insulin lispro 10 units TID, metformin, glipizide as an outpatient. Fasting CBGs  controlled inpatient. -Continue SSI -Start Lantus 10 units qhs  History of CVA -Continue Plavix  Hyperlipidemia -Continue Crestor  Anemia of chronic disease In setting of kidney disease. Ferric gluconate IV ordered this admission in addition to initiation of ESA per nephrology  Multiple sclerosis Not currently on treatment. Per outpatient notes, last MRI with inactive lesions.   DVT prophylaxis: Heparin subq Code Status:   Code Status: Full Code Family Communication: None at bedside Disposition Plan: Discharge home pending continued nephrology recommendations, outpatient HD   Consultants:   Nephrology  Interventional radiology  Procedures:   None  Antimicrobials:  None    Subjective: Nervous about TDC placement today.  Objective: Vitals:   09/29/20 1425 09/29/20 1430 09/29/20 1435 09/29/20 1458  BP: (!) 184/83 (!) 180/84 (!) 168/81 (!) 172/76  Pulse: (!) 107 (!) 108 (!) 105 (!) 104  Resp: 14 (!) 23 12   Temp:    97.7 F (36.5 C)  TempSrc:    Oral  SpO2: 97% 96% 97% 93%  Weight:      Height:        Intake/Output Summary (Last 24 hours) at 09/29/2020 1519 Last data filed at 09/29/2020 1006 Gross per 24 hour  Intake 127.76 ml  Output 1500 ml  Net -1372.24 ml   Filed Weights   09/26/20 1417 09/28/20 0558  Weight: 63.5 kg 62.6 kg    Examination:  General exam: Appears calm and comfortable Respiratory system: Clear to auscultation. Respiratory effort normal. Cardiovascular system: S1 & S2 heard, RRR. Gastrointestinal system: Abdomen is nondistended, soft and nontender. No organomegaly or masses felt. Normal bowel sounds heard. Central nervous system: Alert and oriented. No focal neurological deficits. Musculoskeletal: No calf tenderness Skin: No cyanosis. No rashes Psychiatry: Judgement and  insight appear normal. Mood & affect appropriate.     Data Reviewed: I have personally reviewed following labs and imaging studies  CBC Lab Results   Component Value Date   WBC 6.3 09/27/2020   RBC 2.87 (L) 09/27/2020   HGB 8.0 (L) 09/27/2020   HCT 24.9 (L) 09/27/2020   MCV 86.8 09/27/2020   MCH 27.9 09/27/2020   PLT 213 09/27/2020   MCHC 32.1 09/27/2020   RDW 14.1 09/27/2020   LYMPHSABS 1.3 09/26/2020   MONOABS 0.7 09/26/2020   EOSABS 0.3 09/26/2020   BASOSABS 0.0 [id number]     Last metabolic panel Lab Results  Component Value Date   NA 136 09/29/2020   K 3.7 09/29/2020   CL 101 09/29/2020   CO2 27 09/29/2020   BUN 32 (H) 09/29/2020   CREATININE 5.78 (H) 09/29/2020   GLUCOSE 225 (H) 09/29/2020   GFRNONAA 8 (L) 09/29/2020   CALCIUM 8.5 (L) 09/29/2020   PHOS 4.9 (H) 09/29/2020   PROT 6.7 09/13/2020   ALBUMIN 2.5 (L) 09/29/2020   BILITOT 0.3 09/13/2020   ALKPHOS 59 09/13/2020   AST 11 (L) 09/13/2020   ALT 8 09/13/2020   ANIONGAP 8 09/29/2020    CBG (last 3)  Recent Labs    09/28/20 1932 09/29/20 0604 09/29/20 1136  GLUCAP 173* 159* 161*     GFR: Estimated Creatinine Clearance: 9.5 mL/min (A) (by C-G formula based on SCr of 5.78 mg/dL (H)).  Coagulation Profile: No results for input(s): INR, PROTIME in the last 168 hours.  Recent Results (from the past 240 hour(s))  Resp Panel by RT-PCR (Flu A&B, Covid) Nasopharyngeal Swab     Status: None   Collection Time: 09/26/20  9:27 PM   Specimen: Nasopharyngeal Swab; Nasopharyngeal(NP) swabs in vial transport medium  Result Value Ref Range Status   SARS Coronavirus 2 by RT PCR NEGATIVE NEGATIVE Final    Comment: (NOTE) SARS-CoV-2 target nucleic acids are NOT DETECTED.  The SARS-CoV-2 RNA is generally detectable in upper respiratory specimens during the acute phase of infection. The lowest concentration of SARS-CoV-2 viral copies this assay can detect is 138 copies/mL. A negative result does not preclude SARS-Cov-2 infection and should not be used as the sole basis for treatment or other patient management decisions. A negative result may occur with   improper specimen collection/handling, submission of specimen other than nasopharyngeal swab, presence of viral mutation(s) within the areas targeted by this assay, and inadequate number of viral copies(<138 copies/mL). A negative result must be combined with clinical observations, patient history, and epidemiological information. The expected result is Negative.  Fact Sheet for Patients:  EntrepreneurPulse.com.au  Fact Sheet for Healthcare Providers:  IncredibleEmployment.be  This test is no t yet approved or cleared by the [place] FDA and  has been authorized for detection and/or diagnosis of SARS-CoV-2 by FDA under an Emergency Use Authorization (EUA). This EUA will remain  in effect (meaning this test can be used) for the duration of the COVID-19 declaration under Section 564(b)(1) of the Act, 21 U.S.C.section 360bbb-3(b)(1), unless the authorization is terminated  or revoked sooner.       Influenza A by PCR NEGATIVE NEGATIVE Final   Influenza B by PCR NEGATIVE NEGATIVE Final    Comment: (NOTE) The Xpert Xpress SARS-CoV-2/FLU/RSV plus assay is intended as an aid in the diagnosis of influenza from Nasopharyngeal swab specimens and should not be used as a sole basis for treatment. Nasal washings and aspirates are unacceptable for Xpert Xpress SARS-CoV-2/FLU/RSV  testing.  Fact Sheet for Patients: EntrepreneurPulse.com.au  Fact Sheet for Healthcare Providers: IncredibleEmployment.be  This test is not yet approved or cleared by the [place] FDA and has been authorized for detection and/or diagnosis of SARS-CoV-2 by FDA under an Emergency Use Authorization (EUA). This EUA will remain in effect (meaning this test can be used) for the duration of the COVID-19 declaration under Section 564(b)(1) of the Act, 21 U.S.C. section 360bbb-3(b)(1), unless the authorization is terminated  or revoked.  Performed at [facility], [place] [address], [place], [place] [address]         Radiology Studies: ECHOCARDIOGRAM COMPLETE  Result Date: 09/27/2020    ECHOCARDIOGRAM REPORT   Patient Name:   SHRITA BROSSEAU Date of Exam: 09/27/2020 Medical Rec #:  [id number]    Height:       62.0 in Accession #:    [id number]   Weight:       140.0 lb Date of Birth:  1966-01-13   BSA:          1.643 m Patient Age:    66 years     BP:           143/62 mmHg Patient Gender: F            HR:           86 bpm. Exam Location:  Inpatient Procedure: 2D Echo, Cardiac Doppler and Color Doppler Indications:    Pericardial Effusion  History:        Patient has prior history of Echocardiogram examinations, most                 recent 06/11/2020.  Sonographer:    Merrie Roof RDCS Referring Phys: [place]  1. Left ventricular ejection fraction, by estimation, is 70 to 75%. The left ventricle has hyperdynamic function. The left ventricle has no regional wall motion abnormalities. Left ventricular diastolic function could not be evaluated.  2. Right ventricular systolic function is normal. The right ventricular size is normal. There is mildly elevated pulmonary artery systolic pressure. The estimated right ventricular systolic pressure is 123456 mmHg.  3. A small pericardial effusion is present. The pericardial effusion is circumferential. There is no evidence of cardiac tamponade.  4. The mitral valve is normal in structure. No evidence of mitral valve regurgitation. No evidence of mitral stenosis.  5. The aortic valve is tricuspid. Aortic valve regurgitation is not visualized. Mild aortic valve sclerosis is present, with no evidence of aortic valve stenosis.  6. The inferior vena cava is normal in size with greater than 50% respiratory variability, suggesting right atrial pressure of 3 mmHg. FINDINGS  Left Ventricle: Left ventricular ejection fraction, by estimation, is 70 to 75%. The  left ventricle has hyperdynamic function. The left ventricle has no regional wall motion abnormalities. The left ventricular internal cavity size was normal in size. There is no left ventricular hypertrophy. Left ventricular diastolic function could not be evaluated. Right Ventricle: The right ventricular size is normal. No increase in right ventricular wall thickness. Right ventricular systolic function is normal. There is mildly elevated pulmonary artery systolic pressure. The tricuspid regurgitant velocity is 2.94  m/s, and with an assumed right atrial pressure of 3 mmHg, the estimated right ventricular systolic pressure is 123456 mmHg. Left Atrium: Left atrial size was normal in size. Right Atrium: Right atrial size was normal in size. Pericardium: A small pericardial effusion is present. The pericardial effusion is circumferential. There is no  evidence of cardiac tamponade. Mitral Valve: The mitral valve is normal in structure. No evidence of mitral valve regurgitation. No evidence of mitral valve stenosis. Tricuspid Valve: The tricuspid valve is normal in structure. Tricuspid valve regurgitation is trivial. No evidence of tricuspid stenosis. Aortic Valve: The aortic valve is tricuspid. Aortic valve regurgitation is not visualized. Mild aortic valve sclerosis is present, with no evidence of aortic valve stenosis. Aortic valve mean gradient measures 8.0 mmHg. Aortic valve peak gradient measures 13.8 mmHg. Aortic valve area, by VTI measures 1.96 cm. Pulmonic Valve: The pulmonic valve was normal in structure. Pulmonic valve regurgitation is not visualized. No evidence of pulmonic stenosis. Aorta: The aortic root is normal in size and structure. Venous: The inferior vena cava is normal in size with greater than 50% respiratory variability, suggesting right atrial pressure of 3 mmHg. IAS/Shunts: No atrial level shunt detected by color flow Doppler.  LEFT VENTRICLE PLAX 2D LVIDd:         4.10 cm  Diastology LVIDs:          2.30 cm  LV e' medial:  6.96 cm/s LV PW:         1.30 cm  LV e' lateral: 8.49 cm/s LV IVS:        1.00 cm LVOT diam:     1.90 cm LV SV:         66 LV SV Index:   40 LVOT Area:     2.84 cm  RIGHT VENTRICLE          IVC RV Basal diam:  3.10 cm  IVC diam: 1.20 cm LEFT ATRIUM             Index       RIGHT ATRIUM           Index LA diam:        3.50 cm 2.13 cm/m  RA Area:     15.10 cm LA Vol (A2C):   63.2 ml 38.47 ml/m RA Volume:   37.60 ml  22.89 ml/m LA Vol (A4C):   50.5 ml 30.74 ml/m LA Biplane Vol: 57.1 ml 34.76 ml/m  AORTIC VALVE AV Area (Vmax):    2.06 cm AV Area (Vmean):   1.99 cm AV Area (VTI):     1.96 cm AV Vmax:           186.00 cm/s AV Vmean:          128.000 cm/s AV VTI:            0.338 m AV Peak Grad:      13.8 mmHg AV Mean Grad:      8.0 mmHg LVOT Vmax:         135.00 cm/s LVOT Vmean:        90.000 cm/s LVOT VTI:          0.233 m LVOT/AV VTI ratio: 0.69  AORTA Ao Root diam: 3.00 cm Ao Asc diam:  2.55 cm TRICUSPID VALVE TR Peak grad:   34.6 mmHg TR Vmax:        294.00 cm/s  SHUNTS Systemic VTI:  0.23 m Systemic Diam: 1.90 cm Fransico Him MD Electronically signed by Fransico Him MD Signature Date/Time: 09/27/2020/6:32:09 PM    Final    VAS [place] UPPER EXT VEIN MAPPING (PRE-OP AVF)  Result Date: 09/29/2020 [place] MAPPING Patient Name:  JASARA DARBYSHIRE  Date of Exam:   09/29/2020 Medical Rec #: [id number]     Accession #:  [id number] Date of Birth: Sep 20, 1965    Patient Gender: F Patient Age:   60Y Exam Location:  [facility] Procedure:      VAS [place] UPPER EXT VEIN MAPPING (PRE-OP AVF) Referring Phys: [id number] KELLIE GOLDSBOROUGH --------------------------------------------------------------------------------  Indications: Pre-access. Comparison Study: no prior Performing Technologist: Abram Sander RVS  Examination Guidelines: A complete evaluation includes B-mode imaging, spectral Doppler, color Doppler, and power Doppler as needed of all accessible portions of each vessel.  Bilateral testing is considered an integral part of a complete examination. Limited examinations for reoccurring indications may be performed as noted. +-----------------+-------------+----------+--------+ Right Cephalic   Diameter (cm)Depth (cm)Findings +-----------------+-------------+----------+--------+ Shoulder             0.22        1.35            +-----------------+-------------+----------+--------+ Prox upper arm       0.20        1.28            +-----------------+-------------+----------+--------+ Mid upper arm        0.21        1.37            +-----------------+-------------+----------+--------+ Dist upper arm       0.10        0.47            +-----------------+-------------+----------+--------+ Antecubital fossa    0.16        0.25            +-----------------+-------------+----------+--------+ Prox forearm         0.21        0.82            +-----------------+-------------+----------+--------+ Mid forearm          0.22        0.76            +-----------------+-------------+----------+--------+ Dist forearm         0.22        0.38            +-----------------+-------------+----------+--------+ Wrist                0.23        0.32            +-----------------+-------------+----------+--------+ +-----------------+-------------+----------+--------+ Right Basilic    Diameter (cm)Depth (cm)Findings +-----------------+-------------+----------+--------+ Prox upper arm       0.35        0.99            +-----------------+-------------+----------+--------+ Mid upper arm        0.27        1.19            +-----------------+-------------+----------+--------+ Dist upper arm       0.29        1.26            +-----------------+-------------+----------+--------+ Antecubital fossa    0.21        1.03            +-----------------+-------------+----------+--------+ Prox forearm         0.21        0.76             +-----------------+-------------+----------+--------+ Mid forearm          0.20        0.46            +-----------------+-------------+----------+--------+ Distal forearm       0.21        0.36            +-----------------+-------------+----------+--------+  Wrist                0.18        0.39            +-----------------+-------------+----------+--------+ +-----------------+-------------+----------+---------+ Left Cephalic    Diameter (cm)Depth (cm)Findings  +-----------------+-------------+----------+---------+ Shoulder             0.31        1.02             +-----------------+-------------+----------+---------+ Prox upper arm       0.24        1.07             +-----------------+-------------+----------+---------+ Mid upper arm        0.13        1.23   branching +-----------------+-------------+----------+---------+ Dist upper arm       0.13        1.05             +-----------------+-------------+----------+---------+ Antecubital fossa    0.20        2.97             +-----------------+-------------+----------+---------+ Prox forearm         0.18        0.65             +-----------------+-------------+----------+---------+ Mid forearm          0.24        0.52             +-----------------+-------------+----------+---------+ Dist forearm         0.22        0.34             +-----------------+-------------+----------+---------+ +-----------------+-------------+----------+---------+ Left Basilic     Diameter (cm)Depth (cm)Findings  +-----------------+-------------+----------+---------+ Prox upper arm       0.38        1.48             +-----------------+-------------+----------+---------+ Mid upper arm        0.25        1.42             +-----------------+-------------+----------+---------+ Dist upper arm       0.27        1.18             +-----------------+-------------+----------+---------+ Antecubital fossa    0.26         0.68             +-----------------+-------------+----------+---------+ Prox forearm         0.22        0.30             +-----------------+-------------+----------+---------+ Mid forearm          0.18        0.23             +-----------------+-------------+----------+---------+ Distal forearm       0.19        0.28             +-----------------+-------------+----------+---------+ Elbow                                   branching +-----------------+-------------+----------+---------+ *See table(s) above for measurements and observations.  Diagnosing physician:    Preliminary         Scheduled Meds: . amLODipine  10 mg Oral Daily  . Chlorhexidine Gluconate Cloth  6 each Topical Q0600  .  darbepoetin (ARANESP) injection - DIALYSIS  150 mcg Intravenous Q Mon-HD  . famotidine  20 mg Oral Daily  . FLUoxetine  20 mg Oral Daily  . furosemide  80 mg Intravenous Daily  . gelatin adsorbable      . [START ON 09/30/2020] heparin  5,000 Units Subcutaneous Q8H  . hydrALAZINE  50 mg Oral Q8H  . insulin aspart  0-6 Units Subcutaneous TID WC  . lidocaine (PF)      . lidocaine-EPINEPHrine      . melatonin  3 mg Oral QHS  . metoCLOPramide  5 mg Oral BID  . rosuvastatin  10 mg Oral Daily  . sodium chloride flush  3 mL Intravenous Q12H   Continuous Infusions: . sodium chloride Stopped (09/28/20 1354)  . ceFAZolin    . ferric gluconate (FERRLECIT/NULECIT) IV 250 mg (09/29/20 0958)     LOS: 2 days     Cordelia Poche, MD [facility] 09/29/2020, 3:19 PM  If 7PM-7AM, please contact night-coverage [email or web address]

## 2020-09-30 ENCOUNTER — Ambulatory Visit: Payer: Self-pay | Admitting: *Deleted

## 2020-09-30 DIAGNOSIS — I10 Essential (primary) hypertension: Secondary | ICD-10-CM | POA: Diagnosis not present

## 2020-09-30 DIAGNOSIS — N185 Chronic kidney disease, stage 5: Secondary | ICD-10-CM | POA: Diagnosis not present

## 2020-09-30 DIAGNOSIS — E8779 Other fluid overload: Secondary | ICD-10-CM | POA: Diagnosis not present

## 2020-09-30 DIAGNOSIS — Z8673 Personal history of transient ischemic attack (TIA), and cerebral infarction without residual deficits: Secondary | ICD-10-CM | POA: Diagnosis not present

## 2020-09-30 DIAGNOSIS — R9431 Abnormal electrocardiogram [ECG] [EKG]: Secondary | ICD-10-CM

## 2020-09-30 LAB — GLUCOSE, CAPILLARY
Glucose-Capillary: 97 mg/dL (ref 70–99)
Glucose-Capillary: 123 mg/dL — ABNORMAL HIGH (ref 70–99)
Glucose-Capillary: 161 mg/dL — ABNORMAL HIGH (ref 70–99)
Glucose-Capillary: 106 mg/dL — ABNORMAL HIGH (ref 70–99)
Glucose-Capillary: 249 mg/dL — ABNORMAL HIGH (ref 70–99)

## 2020-09-30 LAB — RENAL FUNCTION PANEL
Albumin: 2.7 g/dL — ABNORMAL LOW (ref 3.5–5.0)
Anion gap: 6 (ref 5–15)
BUN: 14 mg/dL (ref 6–20)
CO2: 28 mmol/L (ref 22–32)
Calcium: 8.8 mg/dL — ABNORMAL LOW (ref 8.9–10.3)
Chloride: 103 mmol/L (ref 98–111)
Creatinine, Ser: 3.75 mg/dL — ABNORMAL HIGH (ref 0.44–1.00)
GFR, Estimated: 14 mL/min — ABNORMAL LOW (ref >60)
Glucose, Bld: 99 mg/dL (ref 70–99)
Phosphorus: 3.7 mg/dL (ref 2.5–4.6)
Potassium: 3.7 mmol/L (ref 3.5–5.1)
Sodium: 137 mmol/L (ref 135–145)

## 2020-09-30 MED ORDER — HEPARIN SODIUM (PORCINE) 1000 UNIT/ML IJ SOLN
INTRAMUSCULAR | Status: AC
  Filled 2020-09-30: qty 1

## 2020-09-30 MED ORDER — LABETALOL HCL 5 MG/ML IV SOLN
20.0000 mg | INTRAVENOUS | Status: DC | PRN
  Administered 2020-10-05 – 2020-10-06 (×2): 20 mg via INTRAVENOUS
  Filled 2020-09-30 (×3): qty 4

## 2020-09-30 NOTE — Progress Notes (Signed)
PROGRESS NOTE    Hannah [name]  [id number] DOB: 1965-10-07 DOA: 09/26/2020 PCP: Vernie Shanks, MD   Brief Narrative: Hannah [name] is a 55 y.o. female with a history of CKD stage V, chronic anemia, history of CVA, diabetes mellitus type 2, hypertension, hyperlipidemia, multiple sclerosis.  Patient presented secondary to shortness of breath and lower extremity swelling with evidence of fluid overload on admission.  Patient had associated worsening kidney disease consistent with volume overload from renal failure.  Patient started on IV Lasix diuresis.  Nephrology consulted for concern of uremia symptoms with associated worsening kidney disease.  Recommendation to start hemodialysis.   Assessment & Plan:   Principal Problem:   Volume overload Active Problems:   Multiple sclerosis (HCC)   Hyperlipidemia   Uncontrolled type 2 diabetes mellitus with hyperglycemia (HCC)   Essential hypertension   H/O: CVA (cerebrovascular accident)   CKD (chronic kidney disease) stage 5, GFR less than 15 ml/min (HCC)   SOB (shortness of breath)   Volume overload In setting of worsening CKD with progression to ESRD. Nephology consulted. Lasix IV started with improvement of symptoms. Patient to start HD per nephrology  ESRD Uremia Patient presented with volume overload as mentioned above. Progression from CKD V. Nephrology initiating dialysis for continued management of kidney disease -Nephrology recommendations: iHD -Vascular surgery: considering AV graft placement for permanent dialysis access  Primary hypertension Patient is prescribed amlodipine, hydralazine, and Coreg (not taking) as an outpatient. Blood pressure elevated, likely complicated by fluid overload. -Continue amlodipine, hydralazine  Diabetes mellitus, type 2 Hemoglobin A1C of 8.7% from 08/2020. Patient is on Tresiba 15-45 units, insulin lispro 10 units TID, metformin, glipizide as an outpatient. Fasting CBGs controlled  inpatient. -Continue SSI -Continue Lantus 10 units qhs  History of CVA -Continue Plavix  Hyperlipidemia -Continue Crestor  Anemia of chronic disease In setting of kidney disease. Ferric gluconate IV ordered this admission in addition to initiation of ESA per nephrology  Multiple sclerosis Not currently on treatment. Per outpatient notes, last MRI with inactive lesions.   DVT prophylaxis: Heparin subq Code Status:   Code Status: Full Code Family Communication: None at bedside Disposition Plan: Discharge home pending continued nephrology recommendations, outpatient HD   Consultants:   Nephrology  Interventional radiology  Procedures:   None  Antimicrobials:  None    Subjective: Some pain of TDC but otherwise no concerns.  Objective: Vitals:   09/30/20 1207 09/30/20 1303 09/30/20 1307 09/30/20 1330  BP: (!) 173/80 (!) 176/82 (!) 175/94 (!) 157/84  Pulse: (!) 114 (!) 115 (!) 114 (!) 117  Resp:  14    Temp: 98.4 F (36.9 C) 97.9 F (36.6 C)    TempSrc: Oral Oral    SpO2: 100% 98% 99%   Weight:  58.5 kg    Height:        Intake/Output Summary (Last 24 hours) at 09/30/2020 1339 Last data filed at 09/30/2020 1242 Gross per 24 hour  Intake 0 ml  Output 1850 ml  Net -1850 ml   Filed Weights   09/29/20 1905 09/30/20 0500 09/30/20 1303  Weight: 59.4 kg 54.9 kg 58.5 kg    Examination:  General exam: Appears calm and comfortable Respiratory system: Clear to auscultation. Respiratory effort normal. Cardiovascular system: S1 & S2 heard, RRR. Gastrointestinal system: Abdomen is nondistended, soft and nontender. No organomegaly or masses felt. Normal bowel sounds heard. Central nervous system: Alert and oriented. Right facial droop (chronic) Musculoskeletal: No calf tenderness Skin: No cyanosis. No rashes.  Right TDC in place Psychiatry: Judgement and insight appear normal. Mood & affect appropriate.    Data Reviewed: I have personally reviewed following  labs and imaging studies  CBC Lab Results  Component Value Date   WBC 6.6 09/29/2020   RBC 2.99 (L) 09/29/2020   HGB 8.4 (L) 09/29/2020   HCT 26.2 (L) 09/29/2020   MCV 87.6 09/29/2020   MCH 28.1 09/29/2020   PLT 218 09/29/2020   MCHC 32.1 09/29/2020   RDW 13.9 09/29/2020   LYMPHSABS 1.3 09/26/2020   MONOABS 0.7 09/26/2020   EOSABS 0.3 09/26/2020   BASOSABS 0.0 [id number]     Last metabolic panel Lab Results  Component Value Date   NA 137 09/30/2020   K 3.7 09/30/2020   CL 103 09/30/2020   CO2 28 09/30/2020   BUN 14 09/30/2020   CREATININE 3.75 (H) 09/30/2020   GLUCOSE 99 09/30/2020   GFRNONAA 14 (L) 09/30/2020   CALCIUM 8.8 (L) 09/30/2020   PHOS 3.7 09/30/2020   PROT 6.7 09/13/2020   ALBUMIN 2.7 (L) 09/30/2020   BILITOT 0.3 09/13/2020   ALKPHOS 59 09/13/2020   AST 11 (L) 09/13/2020   ALT 8 09/13/2020   ANIONGAP 6 09/30/2020    CBG (last 3)  Recent Labs    09/29/20 2057 09/30/20 0635 09/30/20 1110  GLUCAP 100* 97 123*     GFR: Estimated Creatinine Clearance: 13.2 mL/min (A) (by C-G formula based on SCr of 3.75 mg/dL (H)).  Coagulation Profile: No results for input(s): INR, PROTIME in the last 168 hours.  Recent Results (from the past 240 hour(s))  Resp Panel by RT-PCR (Flu A&B, Covid) Nasopharyngeal Swab     Status: None   Collection Time: 09/26/20  9:27 PM   Specimen: Nasopharyngeal Swab; Nasopharyngeal(NP) swabs in vial transport medium  Result Value Ref Range Status   SARS Coronavirus 2 by RT PCR NEGATIVE NEGATIVE Final    Comment: (NOTE) SARS-CoV-2 target nucleic acids are NOT DETECTED.  The SARS-CoV-2 RNA is generally detectable in upper respiratory specimens during the acute phase of infection. The lowest concentration of SARS-CoV-2 viral copies this assay can detect is 138 copies/mL. A negative result does not preclude SARS-Cov-2 infection and should not be used as the sole basis for treatment or other patient management decisions. A  negative result may occur with  improper specimen collection/handling, submission of specimen other than nasopharyngeal swab, presence of viral mutation(s) within the areas targeted by this assay, and inadequate number of viral copies(<138 copies/mL). A negative result must be combined with clinical observations, patient history, and epidemiological information. The expected result is Negative.  Fact Sheet for Patients:  EntrepreneurPulse.com.au  Fact Sheet for Healthcare Providers:  IncredibleEmployment.be  This test is no t yet approved or cleared by the [place] FDA and  has been authorized for detection and/or diagnosis of SARS-CoV-2 by FDA under an Emergency Use Authorization (EUA). This EUA will remain  in effect (meaning this test can be used) for the duration of the COVID-19 declaration under Section 564(b)(1) of the Act, 21 U.S.C.section 360bbb-3(b)(1), unless the authorization is terminated  or revoked sooner.       Influenza A by PCR NEGATIVE NEGATIVE Final   Influenza B by PCR NEGATIVE NEGATIVE Final    Comment: (NOTE) The Xpert Xpress SARS-CoV-2/FLU/RSV plus assay is intended as an aid in the diagnosis of influenza from Nasopharyngeal swab specimens and should not be used as a sole basis for treatment. Nasal washings and aspirates are unacceptable for  Xpert Xpress SARS-CoV-2/FLU/RSV testing.  Fact Sheet for Patients: EntrepreneurPulse.com.au  Fact Sheet for Healthcare Providers: IncredibleEmployment.be  This test is not yet approved or cleared by the [place] FDA and has been authorized for detection and/or diagnosis of SARS-CoV-2 by FDA under an Emergency Use Authorization (EUA). This EUA will remain in effect (meaning this test can be used) for the duration of the COVID-19 declaration under Section 564(b)(1) of the Act, 21 U.S.C. section 360bbb-3(b)(1), unless the authorization  is terminated or revoked.  Performed at [facility], [place] [address], [place], [place] [address]         Radiology Studies: IR Fluoro Guide CV Line Right  Result Date: 09/29/2020 INDICATION: 55 year old with chronic kidney disease and needs a hemodialysis catheter. EXAM: FLUOROSCOPIC AND ULTRASOUND GUIDED PLACEMENT OF A TUNNELED DIALYSIS CATHETER Physician: Stephan Minister. Anselm Pancoast, MD MEDICATIONS: Ancef 1 g; The antibiotic was administered within an appropriate time interval prior to skin puncture. ANESTHESIA/SEDATION: Versed 1.0 mg IV; Fentanyl 25 mcg IV; Moderate Sedation Time:  18 minutes The patient was continuously monitored during the procedure by the interventional radiology nurse under my direct supervision. FLUOROSCOPY TIME:  12 seconds, 1 mGy COMPLICATIONS: None immediate. PROCEDURE: Informed consent was obtained for placement of a tunneled dialysis catheter. The patient was placed supine on the interventional table. Ultrasound confirmed a patent right internal jugular vein. Ultrasound images were obtained for documentation. The right neck and chest was prepped and draped in a sterile fashion. Maximal barrier sterile technique was utilized including caps, mask, sterile gowns, sterile gloves, sterile drape, hand hygiene and skin antiseptic. The right neck was anesthetized with 1% lidocaine. A small incision was made with #11 blade scalpel. A 21 gauge needle directed into the right internal jugular vein with ultrasound guidance. A micropuncture dilator set was placed. A 19 cm tip to cuff Palindrome catheter was selected. The skin below the right clavicle was anesthetized and a small incision was made with an #11 blade scalpel. A subcutaneous tunnel was formed to the vein dermatotomy site. The catheter was brought through the tunnel. The vein dermatotomy site was dilated to accommodate a peel-away sheath. The catheter was placed through the peel-away sheath and directed into the central  venous structures. The tip of the catheter was placed at the superior cavoatrial junction with fluoroscopy. Fluoroscopic images were obtained for documentation. Both lumens were found to aspirate and flush well. The proper amount of heparin was flushed in both lumens. The vein dermatotomy site was closed using a single layer of absorbable suture and Dermabond. Surgifoam was placed in subcutaneous tract. The catheter was secured to the skin using Prolene suture. IMPRESSION: Successful placement of a right jugular tunneled dialysis catheter using ultrasound and fluoroscopic guidance. Electronically Signed   By: Markus Daft M.D.   On: 09/29/2020 17:35   IR US Guide Vasc Access Right  Result Date: 09/29/2020 INDICATION: 55 year old with chronic kidney disease and needs a hemodialysis catheter. EXAM: FLUOROSCOPIC AND ULTRASOUND GUIDED PLACEMENT OF A TUNNELED DIALYSIS CATHETER Physician: Stephan Minister. Anselm Pancoast, MD MEDICATIONS: Ancef 1 g; The antibiotic was administered within an appropriate time interval prior to skin puncture. ANESTHESIA/SEDATION: Versed 1.0 mg IV; Fentanyl 25 mcg IV; Moderate Sedation Time:  18 minutes The patient was continuously monitored during the procedure by the interventional radiology nurse under my direct supervision. FLUOROSCOPY TIME:  12 seconds, 1 mGy COMPLICATIONS: None immediate. PROCEDURE: Informed consent was obtained for placement of a tunneled dialysis catheter. The patient was placed supine on the  interventional table. Ultrasound confirmed a patent right internal jugular vein. Ultrasound images were obtained for documentation. The right neck and chest was prepped and draped in a sterile fashion. Maximal barrier sterile technique was utilized including caps, mask, sterile gowns, sterile gloves, sterile drape, hand hygiene and skin antiseptic. The right neck was anesthetized with 1% lidocaine. A small incision was made with #11 blade scalpel. A 21 gauge needle directed into the right internal  jugular vein with ultrasound guidance. A micropuncture dilator set was placed. A 19 cm tip to cuff Palindrome catheter was selected. The skin below the right clavicle was anesthetized and a small incision was made with an #11 blade scalpel. A subcutaneous tunnel was formed to the vein dermatotomy site. The catheter was brought through the tunnel. The vein dermatotomy site was dilated to accommodate a peel-away sheath. The catheter was placed through the peel-away sheath and directed into the central venous structures. The tip of the catheter was placed at the superior cavoatrial junction with fluoroscopy. Fluoroscopic images were obtained for documentation. Both lumens were found to aspirate and flush well. The proper amount of heparin was flushed in both lumens. The vein dermatotomy site was closed using a single layer of absorbable suture and Dermabond. Surgifoam was placed in subcutaneous tract. The catheter was secured to the skin using Prolene suture. IMPRESSION: Successful placement of a right jugular tunneled dialysis catheter using ultrasound and fluoroscopic guidance. Electronically Signed   By: Markus Daft M.D.   On: 09/29/2020 17:35   VAS [place] UPPER EXT VEIN MAPPING (PRE-OP AVF)  Result Date: 09/29/2020 UPPER EXTREMITY VEIN MAPPING Patient Name:  Hannah [name]  Date of Exam:   09/29/2020 Medical Rec #: [id number]     Accession #:    [id number] Date of Birth: 08-26-1965    Patient Gender: F Patient Age:   25Y Exam Location:  [facility] Procedure:      VAS [place] UPPER EXT VEIN MAPPING (PRE-OP AVF) Referring Phys: [id number] KELLIE GOLDSBOROUGH --------------------------------------------------------------------------------  Indications: Pre-access. Comparison Study: no prior Performing Technologist: Abram Sander RVS  Examination Guidelines: A complete evaluation includes B-mode imaging, spectral Doppler, color Doppler, and power Doppler as needed of all accessible portions of each vessel. Bilateral testing  is considered an integral part of a complete examination. Limited examinations for reoccurring indications may be performed as noted. +-----------------+-------------+----------+--------+ Right Cephalic   Diameter (cm)Depth (cm)Findings +-----------------+-------------+----------+--------+ Shoulder             0.22        1.35            +-----------------+-------------+----------+--------+ Prox upper arm       0.20        1.28            +-----------------+-------------+----------+--------+ Mid upper arm        0.21        1.37            +-----------------+-------------+----------+--------+ Dist upper arm       0.10        0.47            +-----------------+-------------+----------+--------+ Antecubital fossa    0.16        0.25            +-----------------+-------------+----------+--------+ Prox forearm         0.21        0.82            +-----------------+-------------+----------+--------+ Mid forearm  0.22        0.76            +-----------------+-------------+----------+--------+ Dist forearm         0.22        0.38            +-----------------+-------------+----------+--------+ Wrist                0.23        0.32            +-----------------+-------------+----------+--------+ +-----------------+-------------+----------+--------+ Right Basilic    Diameter (cm)Depth (cm)Findings +-----------------+-------------+----------+--------+ Prox upper arm       0.35        0.99            +-----------------+-------------+----------+--------+ Mid upper arm        0.27        1.19            +-----------------+-------------+----------+--------+ Dist upper arm       0.29        1.26            +-----------------+-------------+----------+--------+ Antecubital fossa    0.21        1.03            +-----------------+-------------+----------+--------+ Prox forearm         0.21        0.76             +-----------------+-------------+----------+--------+ Mid forearm          0.20        0.46            +-----------------+-------------+----------+--------+ Distal forearm       0.21        0.36            +-----------------+-------------+----------+--------+ Wrist                0.18        0.39            +-----------------+-------------+----------+--------+ +-----------------+-------------+----------+---------+ Left Cephalic    Diameter (cm)Depth (cm)Findings  +-----------------+-------------+----------+---------+ Shoulder             0.31        1.02             +-----------------+-------------+----------+---------+ Prox upper arm       0.24        1.07             +-----------------+-------------+----------+---------+ Mid upper arm        0.13        1.23   branching +-----------------+-------------+----------+---------+ Dist upper arm       0.13        1.05             +-----------------+-------------+----------+---------+ Antecubital fossa    0.20        2.97             +-----------------+-------------+----------+---------+ Prox forearm         0.18        0.65             +-----------------+-------------+----------+---------+ Mid forearm          0.24        0.52             +-----------------+-------------+----------+---------+ Dist forearm         0.22        0.34             +-----------------+-------------+----------+---------+ +-----------------+-------------+----------+---------+  Left Basilic     Diameter (cm)Depth (cm)Findings  +-----------------+-------------+----------+---------+ Prox upper arm       0.38        1.48             +-----------------+-------------+----------+---------+ Mid upper arm        0.25        1.42             +-----------------+-------------+----------+---------+ Dist upper arm       0.27        1.18             +-----------------+-------------+----------+---------+ Antecubital fossa    0.26         0.68             +-----------------+-------------+----------+---------+ Prox forearm         0.22        0.30             +-----------------+-------------+----------+---------+ Mid forearm          0.18        0.23             +-----------------+-------------+----------+---------+ Distal forearm       0.19        0.28             +-----------------+-------------+----------+---------+ Elbow                                   branching +-----------------+-------------+----------+---------+ *See table(s) above for measurements and observations.  Diagnosing physician: Monica Martinez MD Electronically signed by Monica Martinez MD on 09/29/2020 at 4:45:25 PM.    Final         Scheduled Meds: . amLODipine  10 mg Oral Daily  . Chlorhexidine Gluconate Cloth  6 each Topical Q0600  . darbepoetin (ARANESP) injection - DIALYSIS  150 mcg Intravenous Q Mon-HD  . famotidine  20 mg Oral Daily  . FLUoxetine  20 mg Oral Daily  . heparin  5,000 Units Subcutaneous Q8H  . hydrALAZINE  50 mg Oral Q8H  . insulin aspart  0-6 Units Subcutaneous TID WC  . insulin glargine  10 Units Subcutaneous QHS  . melatonin  3 mg Oral QHS  . metoCLOPramide  5 mg Oral BID  . rosuvastatin  10 mg Oral Daily  . sodium chloride flush  3 mL Intravenous Q12H   Continuous Infusions: . sodium chloride Stopped (09/28/20 1354)  . ferric gluconate (FERRLECIT/NULECIT) IV 250 mg (09/29/20 0958)     LOS: 3 days     Cordelia Poche, MD [facility] 09/30/2020, 1:39 PM  If 7PM-7AM, please contact night-coverage [email or web address]

## 2020-09-30 NOTE — Progress Notes (Signed)
   09/30/20 1207  Assess: MEWS Score  Temp 98.4 F (36.9 C)  BP (!) 173/80  Pulse Rate (!) 114  SpO2 100 %  O2 Device Nasal Cannula  O2 Flow Rate (L/min) 2 L/min  Assess: MEWS Score  MEWS Temp 0  MEWS Systolic 0  MEWS Pulse 2  MEWS RR 0  MEWS LOC 0  MEWS Score 2  MEWS Score Color Yellow  Assess: if the MEWS score is Yellow or Red  Were vital signs taken at a resting state? Yes  Focused Assessment No change from prior assessment  Early Detection of Sepsis Score *See Row Information* Low  MEWS guidelines implemented *See Row Information* Yes  Treat  MEWS Interventions Administered scheduled meds/treatments  Pain Scale 0-10  Pain Score 0  Take Vital Signs  Increase Vital Sign Frequency  Yellow: Q 2hr X 2 then Q 4hr X 2, if remains yellow, continue Q 4hrs  Escalate  MEWS: Escalate Yellow: discuss with charge nurse/RN and consider discussing with provider and RRT  Notify: Charge Nurse/RN  Name of Charge Nurse/RN Notified Sharyn Lull Rn  Date Charge Nurse/RN Notified 09/30/20  Time Charge Nurse/RN Notified 1239

## 2020-09-30 NOTE — Progress Notes (Signed)
Renal Navigator and RN Dialysis Coordinator/K. Artman attempted to meet with patient to discuss outpatient HD referral and provide education and support, but she was not in her room at this time. She had just left for HD, which does not provide an optimal space to hold a conversation. We will attempt to meet with patient again at a later time.  Alphonzo Cruise, [place] Renal Navigator [phone]

## 2020-09-30 NOTE — Progress Notes (Signed)
Subjective:  Patient feels okay right now.  Intermittently having some pain.  She met with Dr. Carlis Abbott and is nervous about getting a graft placed.  She states it was hard to talk with him because of the pain she was having.  She wants me to talk with the family about the procedure.  Objective Vital signs in last 24 hours: Vitals:   09/29/20 1928 09/30/20 0500 09/30/20 0540 09/30/20 0636  BP: (!) 166/78  (!) 169/87 (!) 178/81  Pulse: (!) 101  (!) 109 (!) 105  Resp: '17  17 17  ' Temp: 99 F (37.2 C)  98.5 F (36.9 C) 98.5 F (36.9 C)  TempSrc: Oral  Oral Oral  SpO2: 95%  95% 96%  Weight:  54.9 kg    Height:       Weight change:   Intake/Output Summary (Last 24 hours) at 09/30/2020 1101 Last data filed at 09/30/2020 2641 Gross per 24 hour  Intake 0 ml  Output 1500 ml  Net -1500 ml    Assessment/Plan: [name] with DM and HTN with progressive CKD and now what appears to be uremic symptoms.  My recommendation would be to initiate dialysis this admission 1.Renal- advanced CKD that has been longstanding now felt to be ESRD-  Has now progressed to the point where she is uremic.  The most efficient way to get her better is to start dialysis.   Will need education regarding that and possibly starting at the TCU as an OP.  Will get our education team involved as well.  Status post [facility] placement on 5/16 with IR.  Appreciate help.  Receiving 2/3 of initiation dialysis sessions today with plan to receive dialysis tomorrow.  Dr. Carlis Abbott consulted for AVF/AVG placement; appreciate help. Patient has been hesitant. I will talk with her daughter today. 2. Hypertension/volume  - BP high and she is volume overloaded.   Has received lasix. Now volume mgmt with HD 3. Anemia  - is quite anemic-   iron stores low, will replete and now on ESA 158mg weekly. 4. Bones-  PTH 84. No treatment needed 5. Acidosis- Resolved    SReesa [name]   Labs: Basic Metabolic Panel: Recent Labs  Lab 09/28/20 0239  09/29/20 0037 09/30/20 0426  NA 139 136 137  K 3.7 3.7 3.7  CL 103 101 103  CO2 '26 27 28  ' GLUCOSE 167* 225* 99  BUN 32* 32* 14  CREATININE 5.45* 5.78* 3.75*  CALCIUM 8.9 8.5* 8.8*  PHOS 4.9* 4.9* 3.7   Liver Function Tests: Recent Labs  Lab 09/28/20 0239 09/29/20 0037 09/30/20 0426  ALBUMIN 2.5* 2.5* 2.7*   No results for input(s): LIPASE, AMYLASE in the last 168 hours. No results for input(s): AMMONIA in the last 168 hours. CBC: Recent Labs  Lab 09/26/20 2127 09/27/20 0330 09/29/20 1634  WBC 7.0 6.3 6.6  NEUTROABS 4.6  --   --   HGB 8.2* 8.0* 8.4*  HCT 25.5* 24.9* 26.2*  MCV 87.0 86.8 87.6  PLT 222 213 218   Cardiac Enzymes: No results for input(s): CKTOTAL, CKMB, CKMBINDEX, TROPONINI in the last 168 hours. CBG: Recent Labs  Lab 09/29/20 0604 09/29/20 1136 09/29/20 1640 09/29/20 2057 09/30/20 0635  GLUCAP 159* 161* 148* 100* 97    Iron Studies:  Recent Labs    09/28/20 0239  IRON 37  TIBC 199*  FERRITIN 23   Studies/Results: IR Fluoro Guide CV Line Right  Result Date: 09/29/2020 INDICATION: 55year old with  chronic kidney disease and needs a hemodialysis catheter. EXAM: FLUOROSCOPIC AND ULTRASOUND GUIDED PLACEMENT OF A TUNNELED DIALYSIS CATHETER Physician: Stephan Minister. Anselm Pancoast, MD MEDICATIONS: Ancef 1 g; The antibiotic was administered within an appropriate time interval prior to skin puncture. ANESTHESIA/SEDATION: Versed 1.0 mg IV; Fentanyl 25 mcg IV; Moderate Sedation Time:  18 minutes The patient was continuously monitored during the procedure by the interventional radiology nurse under my direct supervision. FLUOROSCOPY TIME:  12 seconds, 1 mGy COMPLICATIONS: None immediate. PROCEDURE: Informed consent was obtained for placement of a tunneled dialysis catheter. The patient was placed supine on the interventional table. Ultrasound confirmed a patent right internal jugular vein. Ultrasound images were obtained for documentation. The right neck and chest was  prepped and draped in a sterile fashion. Maximal barrier sterile technique was utilized including caps, mask, sterile gowns, sterile gloves, sterile drape, hand hygiene and skin antiseptic. The right neck was anesthetized with 1% lidocaine. A small incision was made with #11 blade scalpel. A 21 gauge needle directed into the right internal jugular vein with ultrasound guidance. A micropuncture dilator set was placed. A 19 cm tip to cuff Palindrome catheter was selected. The skin below the right clavicle was anesthetized and a small incision was made with an #11 blade scalpel. A subcutaneous tunnel was formed to the vein dermatotomy site. The catheter was brought through the tunnel. The vein dermatotomy site was dilated to accommodate a peel-away sheath. The catheter was placed through the peel-away sheath and directed into the central venous structures. The tip of the catheter was placed at the superior cavoatrial junction with fluoroscopy. Fluoroscopic images were obtained for documentation. Both lumens were found to aspirate and flush well. The proper amount of heparin was flushed in both lumens. The vein dermatotomy site was closed using a single layer of absorbable suture and Dermabond. Surgifoam was placed in subcutaneous tract. The catheter was secured to the skin using Prolene suture. IMPRESSION: Successful placement of a right jugular tunneled dialysis catheter using ultrasound and fluoroscopic guidance. Electronically Signed   By: Markus Daft M.D.   On: 09/29/2020 17:35   IR US Guide Vasc Access Right  Result Date: 09/29/2020 INDICATION: 55 year old with chronic kidney disease and needs a hemodialysis catheter. EXAM: FLUOROSCOPIC AND ULTRASOUND GUIDED PLACEMENT OF A TUNNELED DIALYSIS CATHETER Physician: Stephan Minister. Anselm Pancoast, MD MEDICATIONS: Ancef 1 g; The antibiotic was administered within an appropriate time interval prior to skin puncture. ANESTHESIA/SEDATION: Versed 1.0 mg IV; Fentanyl 25 mcg IV; Moderate  Sedation Time:  18 minutes The patient was continuously monitored during the procedure by the interventional radiology nurse under my direct supervision. FLUOROSCOPY TIME:  12 seconds, 1 mGy COMPLICATIONS: None immediate. PROCEDURE: Informed consent was obtained for placement of a tunneled dialysis catheter. The patient was placed supine on the interventional table. Ultrasound confirmed a patent right internal jugular vein. Ultrasound images were obtained for documentation. The right neck and chest was prepped and draped in a sterile fashion. Maximal barrier sterile technique was utilized including caps, mask, sterile gowns, sterile gloves, sterile drape, hand hygiene and skin antiseptic. The right neck was anesthetized with 1% lidocaine. A small incision was made with #11 blade scalpel. A 21 gauge needle directed into the right internal jugular vein with ultrasound guidance. A micropuncture dilator set was placed. A 19 cm tip to cuff Palindrome catheter was selected. The skin below the right clavicle was anesthetized and a small incision was made with an #11 blade scalpel. A subcutaneous tunnel was formed to the  vein dermatotomy site. The catheter was brought through the tunnel. The vein dermatotomy site was dilated to accommodate a peel-away sheath. The catheter was placed through the peel-away sheath and directed into the central venous structures. The tip of the catheter was placed at the superior cavoatrial junction with fluoroscopy. Fluoroscopic images were obtained for documentation. Both lumens were found to aspirate and flush well. The proper amount of heparin was flushed in both lumens. The vein dermatotomy site was closed using a single layer of absorbable suture and Dermabond. Surgifoam was placed in subcutaneous tract. The catheter was secured to the skin using Prolene suture. IMPRESSION: Successful placement of a right jugular tunneled dialysis catheter using ultrasound and fluoroscopic guidance.  Electronically Signed   By: Markus Daft M.D.   On: 09/29/2020 17:35   VAS [place] UPPER EXT VEIN MAPPING (PRE-OP AVF)  Result Date: 09/29/2020 UPPER EXTREMITY VEIN MAPPING Patient Name:  Hannah [name]  Date of Exam:   09/29/2020 Medical Rec #: [id number]     Accession #:    [id number] Date of Birth: [birth year]    Patient Gender: F Patient Age:   29Y Exam Location:  [facility] Procedure:      VAS [place] UPPER EXT VEIN MAPPING (PRE-OP AVF) Referring Phys: [id number] KELLIE GOLDSBOROUGH --------------------------------------------------------------------------------  Indications: Pre-access. Comparison Study: no prior Performing Technologist: Abram Sander RVS  Examination Guidelines: A complete evaluation includes B-mode imaging, spectral Doppler, color Doppler, and power Doppler as needed of all accessible portions of each vessel. Bilateral testing is considered an integral part of a complete examination. Limited examinations for reoccurring indications may be performed as noted. +-----------------+-------------+----------+--------+ Right Cephalic   Diameter (cm)Depth (cm)Findings +-----------------+-------------+----------+--------+ Shoulder             0.22        1.35            +-----------------+-------------+----------+--------+ Prox upper arm       0.20        1.28            +-----------------+-------------+----------+--------+ Mid upper arm        0.21        1.37            +-----------------+-------------+----------+--------+ Dist upper arm       0.10        0.47            +-----------------+-------------+----------+--------+ Antecubital fossa    0.16        0.25            +-----------------+-------------+----------+--------+ Prox forearm         0.21        0.82            +-----------------+-------------+----------+--------+ Mid forearm          0.22        0.76            +-----------------+-------------+----------+--------+ Dist forearm         0.22        0.38             +-----------------+-------------+----------+--------+ Wrist                0.23        0.32            +-----------------+-------------+----------+--------+ +-----------------+-------------+----------+--------+ Right Basilic    Diameter (cm)Depth (cm)Findings +-----------------+-------------+----------+--------+ Prox upper arm       0.35        0.99            +-----------------+-------------+----------+--------+  Mid upper arm        0.27        1.19            +-----------------+-------------+----------+--------+ Dist upper arm       0.29        1.26            +-----------------+-------------+----------+--------+ Antecubital fossa    0.21        1.03            +-----------------+-------------+----------+--------+ Prox forearm         0.21        0.76            +-----------------+-------------+----------+--------+ Mid forearm          0.20        0.46            +-----------------+-------------+----------+--------+ Distal forearm       0.21        0.36            +-----------------+-------------+----------+--------+ Wrist                0.18        0.39            +-----------------+-------------+----------+--------+ +-----------------+-------------+----------+---------+ Left Cephalic    Diameter (cm)Depth (cm)Findings  +-----------------+-------------+----------+---------+ Shoulder             0.31        1.02             +-----------------+-------------+----------+---------+ Prox upper arm       0.24        1.07             +-----------------+-------------+----------+---------+ Mid upper arm        0.13        1.23   branching +-----------------+-------------+----------+---------+ Dist upper arm       0.13        1.05             +-----------------+-------------+----------+---------+ Antecubital fossa    0.20        2.97             +-----------------+-------------+----------+---------+ Prox forearm         0.18         0.65             +-----------------+-------------+----------+---------+ Mid forearm          0.24        0.52             +-----------------+-------------+----------+---------+ Dist forearm         0.22        0.34             +-----------------+-------------+----------+---------+ +-----------------+-------------+----------+---------+ Left Basilic     Diameter (cm)Depth (cm)Findings  +-----------------+-------------+----------+---------+ Prox upper arm       0.38        1.48             +-----------------+-------------+----------+---------+ Mid upper arm        0.25        1.42             +-----------------+-------------+----------+---------+ Dist upper arm       0.27        1.18             +-----------------+-------------+----------+---------+ Antecubital fossa    0.26        0.68             +-----------------+-------------+----------+---------+  Prox forearm         0.22        0.30             +-----------------+-------------+----------+---------+ Mid forearm          0.18        0.23             +-----------------+-------------+----------+---------+ Distal forearm       0.19        0.28             +-----------------+-------------+----------+---------+ Elbow                                   branching +-----------------+-------------+----------+---------+ *See table(s) above for measurements and observations.  Diagnosing physician: Monica Martinez MD Electronically signed by Monica Martinez MD on 09/29/2020 at 4:45:25 PM.    Final    Medications: Infusions: . sodium chloride Stopped (09/28/20 1354)  . ferric gluconate (FERRLECIT/NULECIT) IV 250 mg (09/29/20 3716)    Scheduled Medications: . amLODipine  10 mg Oral Daily  . Chlorhexidine Gluconate Cloth  6 each Topical Q0600  . darbepoetin (ARANESP) injection - DIALYSIS  150 mcg Intravenous Q Mon-HD  . famotidine  20 mg Oral Daily  . FLUoxetine  20 mg Oral Daily  . heparin  5,000 Units  Subcutaneous Q8H  . hydrALAZINE  50 mg Oral Q8H  . insulin aspart  0-6 Units Subcutaneous TID WC  . insulin glargine  10 Units Subcutaneous QHS  . melatonin  3 mg Oral QHS  . metoCLOPramide  5 mg Oral BID  . rosuvastatin  10 mg Oral Daily  . sodium chloride flush  3 mL Intravenous Q12H    have reviewed scheduled and prn medications.  Physical Exam: General:  NAD-  Lying in bed Heart: RRR Lungs: mostly clear, bilateral chest rise Abdomen: soft, non tender Extremities: mild edema    09/30/2020,11:01 AM  LOS: 3 days

## 2020-09-30 NOTE — Progress Notes (Signed)
[facility]  Subjective  -right IJ tunneled dialysis catheter placed in IR yesterday.  Underwent dialysis last night.   Objective (!) 178/81 (!) 105 98.5 F (36.9 C) (Oral) 17 96%  Intake/Output Summary (Last 24 hours) at 09/30/2020 0650 Last data filed at 09/30/2020 [id number] Gross per 24 hour  Intake 0 ml  Output 2500 ml  Net -2500 ml    Palpable radial brachial pulses bilateral upper extremities Right IJ tunneled dialysis catheter  Laboratory Lab Results: Recent Labs    09/29/20 1634  WBC 6.6  HGB 8.4*  HCT 26.2*  PLT 218   BMET Recent Labs    09/29/20 0037 09/30/20 0426  NA 136 137  K 3.7 3.7  CL 101 103  CO2 27 28  GLUCOSE 225* 99  BUN 32* 14  CREATININE 5.78* 3.75*  CALCIUM 8.5* 8.8*    COAG Lab Results  Component Value Date   INR 0.9 06/09/2020   No results found for: PTT  Assessment/Planning:  55 year old female with progression of her stage V CKD that vascular surgery was consulted for permanent dialysis access.  Given history of stroke she is now left-hand-dominant.  We discussed plans for right arm access and she has very marginal surface veins on vein mapping and high likelihood for AV graft.  I came by this morning to answer any additional questions.  She still wants to talk to her children.  I did discuss with her daughter at bedside yesterday and they would like to further discuss options as a family as well.  Please let us know if she decides to proceed.  We will follow-up again tomorrow.  Marty Heck 09/30/2020 6:50 AM --

## 2020-09-30 NOTE — Progress Notes (Signed)
Paged about STEMI readout on automated EKG printout. Upon personal review, significant baseline wander. Very poor EKG. Unlikely STEMI present. Repeat EKG requested which shows no evidence of STEMI. Patient has sinus tachycardia with mild lateral non-specific t wave flattening. Unsure of etiology for tachycardia. Patient is back from HD. Possible she may be slightly volume depleted. She also has continued pain from her [facility] which could be contributing. Currently, would recommend to continue telemetry.  Cordelia Poche, MD [facility] 09/30/2020, 6:36 PM

## 2020-10-01 DIAGNOSIS — N185 Chronic kidney disease, stage 5: Secondary | ICD-10-CM | POA: Diagnosis not present

## 2020-10-01 DIAGNOSIS — E8779 Other fluid overload: Secondary | ICD-10-CM | POA: Diagnosis not present

## 2020-10-01 LAB — GLUCOSE, CAPILLARY
Glucose-Capillary: 113 mg/dL — ABNORMAL HIGH (ref 70–99)
Glucose-Capillary: 149 mg/dL — ABNORMAL HIGH (ref 70–99)
Glucose-Capillary: 228 mg/dL — ABNORMAL HIGH (ref 70–99)
Glucose-Capillary: 94 mg/dL (ref 70–99)
Glucose-Capillary: 138 mg/dL — ABNORMAL HIGH (ref 70–99)

## 2020-10-01 LAB — CBC
HCT: 24.7 % — ABNORMAL LOW (ref 36.0–46.0)
Hemoglobin: 7.8 g/dL — ABNORMAL LOW (ref 12.0–15.0)
MCH: 27.9 pg (ref 26.0–34.0)
MCHC: 31.6 g/dL (ref 30.0–36.0)
MCV: 88.2 fL (ref 80.0–100.0)
Platelets: 188 10*3/uL (ref 150–400)
RBC: 2.8 MIL/uL — ABNORMAL LOW (ref 3.87–5.11)
RDW: 13.9 % (ref 11.5–15.5)
WBC: 7.8 10*3/uL (ref 4.0–10.5)
nRBC: 0 % (ref 0.0–0.2)

## 2020-10-01 LAB — RENAL FUNCTION PANEL
Albumin: 2.6 g/dL — ABNORMAL LOW (ref 3.5–5.0)
Anion gap: 9 (ref 5–15)
BUN: 6 mg/dL (ref 6–20)
CO2: 25 mmol/L (ref 22–32)
Calcium: 8.7 mg/dL — ABNORMAL LOW (ref 8.9–10.3)
Chloride: 98 mmol/L (ref 98–111)
Creatinine, Ser: 2.62 mg/dL — ABNORMAL HIGH (ref 0.44–1.00)
GFR, Estimated: 21 mL/min — ABNORMAL LOW (ref >60)
Glucose, Bld: 158 mg/dL — ABNORMAL HIGH (ref 70–99)
Phosphorus: 2.7 mg/dL (ref 2.5–4.6)
Potassium: 3.7 mmol/L (ref 3.5–5.1)
Sodium: 132 mmol/L — ABNORMAL LOW (ref 135–145)

## 2020-10-01 MED ORDER — METOCLOPRAMIDE HCL 5 MG/ML IJ SOLN
10.0000 mg | Freq: Four times a day (QID) | INTRAMUSCULAR | Status: DC | PRN
  Administered 2020-10-01 – 2020-10-14 (×6): 10 mg via INTRAVENOUS
  Filled 2020-10-01 (×6): qty 2

## 2020-10-01 MED ORDER — TIZANIDINE HCL 4 MG PO TABS
2.0000 mg | ORAL_TABLET | Freq: Three times a day (TID) | ORAL | Status: DC
  Administered 2020-10-01 – 2020-10-05 (×11): 2 mg via ORAL
  Filled 2020-10-01 (×11): qty 1

## 2020-10-01 NOTE — Evaluation (Signed)
Occupational Therapy Evaluation Patient Details Name: Hannah [name] MRN: [id number] DOB: [birth year] Today's Date: 10/01/2020    History of Present Illness Hannah [name] is a 55 y.o. female with medical history significant of CKD 5, anemia, CVA, diabetes, hypertension, hyperlipidemia, multiple sclerosis who presents with shortness of breath and lower extremity swelling. Found to be in Volume overload   Clinical Impression   This 55 yo female admitted with above presents to acute OT with PLOF of being setup-min guard A with all basic ADLs and mobility. She currently is at this level but has the ability to progress to S level overall. Recommend follow up OPPT. We will continue to follow.    Follow Up Recommendations  Outpatient OT;Supervision/Assistance - 24 hour    Equipment Recommendations  None recommended by OT       Precautions / Restrictions Precautions Precautions: Fall Precaution Comments: Baseline RT sided weakness due to CVA Restrictions Weight Bearing Restrictions: No      Mobility Bed Mobility               General bed mobility comments: Pt sitting on EOB upon arrival    Transfers Overall transfer level: Needs assistance Equipment used: Rolling walker (2 wheeled) Transfers: Sit to/from Stand   Stand pivot transfers: Min guard       General transfer comment: min guard A to ambulate around room at RW level (per dtr someone is always with her when she is up ambulating--issued pt a gait belt for family to use with pt when she is up and about)    Balance Overall balance assessment: Needs assistance Sitting-balance support: No upper extremity supported;Feet supported Sitting balance-Leahy Scale: Good     Standing balance support: Bilateral upper extremity supported Standing balance-Leahy Scale: Poor                             ADL either performed or assessed with clinical judgement   ADL Overall ADL's : Needs  assistance/impaired Eating/Feeding: Set up;Supervision/ safety Eating/Feeding Details (indicate cue type and reason): sitting EOB Grooming: Set up;Supervision/safety Grooming Details (indicate cue type and reason): sitting EOB Upper Body Bathing: Set up;Sitting;Supervision/ safety Upper Body Bathing Details (indicate cue type and reason): sitting EOB Lower Body Bathing: Min guard;Sit to/from stand   Upper Body Dressing : Set up;Supervision/safety;Sitting Upper Body Dressing Details (indicate cue type and reason): sitting EOB Lower Body Dressing: Min guard;Sit to/from stand   Toilet Transfer: Min guard;Ambulation;RW Toilet Transfer Details (indicate cue type and reason): Bed>door>back around to recliner Toileting- Clothing Manipulation and Hygiene: Minimal assistance Toileting - Clothing Manipulation Details (indicate cue type and reason): min guard A sit<>stand             Vision Baseline Vision/History: Wears glasses Wears Glasses: Reading only Patient Visual Report: No change from baseline              Pertinent Vitals/Pain Pain Assessment: 0-10 Pain Score: 8  Pain Location: right up chest where dialysis access currently Pain Descriptors / Indicators: Sore Pain Intervention(s): Limited activity within patient's tolerance;Monitored during session     Hand Dominance Right (but now mainly left handed due to prior CVA)   Extremity/Trunk Assessment Upper Extremity Assessment Upper Extremity Assessment: RUE deficits/detail RUE Deficits / Details: Baseline deficits. Very limited shoulder AROM to ~70 degrees and PROM to 90. Elbow to hand WFL, but weak, grossly 3/5. Able to use it to donn socks sitting EOB by crossing legs RUE  Coordination: decreased fine motor;decreased gross motor           Communication Communication Communication:  (mild dysarthria)   Cognition Arousal/Alertness: Awake/alert Behavior During Therapy: WFL for tasks assessed/performed Overall  Cognitive Status: Within Functional Limits for tasks assessed                                                Home Living Family/patient expects to be discharged to:: Private residence Living Arrangements: Children Available Help at Discharge: Family;Available 24 hours/day;Other (Comment) Type of Home: Apartment Home Access: Level entry (one step up onto curb)     Home Layout: One level     Bathroom Shower/Tub: Tub/shower unit (pt sponge bathes)   Bathroom Toilet: Standard     Home Equipment: Walker - 4 wheels;Cane - single point;Wheelchair - Education officer, community - power;Tub bench          Prior Functioning/Environment Level of Independence: Needs assistance  Gait / Transfers Assistance Needed: amb with RW short distances in apt and stated that her daughter is always holding her, also uses power chair vs manual chair inside and for longer distances ADL's / Homemaking Assistance Needed: Pt reports she can bath/dress/toilet herself (dtr in room and does not deny this)            OT Problem List: Decreased strength;Decreased range of motion;Impaired balance (sitting and/or standing);Impaired UE functional use;Pain      OT Treatment/Interventions: Self-care/ADL training;DME and/or AE instruction;Patient/family education;Balance training    OT Goals(Current goals can be found in the care plan section) Acute Rehab OT Goals Patient Stated Goal: to go home OT Goal Formulation: With patient Time For Goal Achievement: 10/15/20 Potential to Achieve Goals: Good  OT Frequency: Min 2X/week              AM-PAC OT "6 Clicks" Daily Activity     Outcome Measure Help from another person eating meals?: A Little (setup) Help from another person taking care of personal grooming?: A Little (setup) Help from another person toileting, which includes using toliet, bedpan, or urinal?: A Little Help from another person bathing (including washing, rinsing, drying)?: A  Little Help from another person to put on and taking off regular upper body clothing?: A Little Help from another person to put on and taking off regular lower body clothing?: A Little 6 Click Score: 18   End of Session Equipment Utilized During Treatment: Gait belt;Rolling walker  Activity Tolerance: Patient tolerated treatment well Patient left: in chair;with call bell/phone within reach;with chair alarm set;with family/visitor present  OT Visit Diagnosis: Unsteadiness on feet (R26.81);Other abnormalities of gait and mobility (R26.89);Pain;Muscle weakness (generalized) (M62.81);Hemiplegia and hemiparesis Hemiplegia - Right/Left: Right Hemiplegia - dominant/non-dominant: Dominant Hemiplegia - caused by:  (old CVA) Pain - Right/Left: Right Pain - part of body:  (upper chest area where dialysis access is)                Time: [id number] OT Time Calculation (min): 18 min Charges:  OT General Charges $OT Visit: 1 Visit  [place], OTR/L Acute Rehab Services Pager [phone] Office [phone]     Almon Register 10/01/2020, 11:10 AM

## 2020-10-01 NOTE — Progress Notes (Signed)
[facility]  Subjective  - she is now agreeable to proceed with right arm AV fistula access versus graft.   Objective (!) 174/85 (!) 103 98 F (36.7 C) (Oral) 17 100%  Intake/Output Summary (Last 24 hours) at 10/01/2020 0914 Last data filed at 09/30/2020 1700 Gross per 24 hour  Intake 120 ml  Output 350 ml  Net -230 ml    Palpable radial brachial pulses bilateral upper extremities Right IJ tunneled dialysis catheter  Laboratory Lab Results: Recent Labs    09/29/20 1634 10/01/20 0246  WBC 6.6 7.8  HGB 8.4* 7.8*  HCT 26.2* 24.7*  PLT 218 188   BMET Recent Labs    09/30/20 0426 10/01/20 0246  NA 137 132*  K 3.7 3.7  CL 103 98  CO2 28 25  GLUCOSE 99 158*  BUN 14 6  CREATININE 3.75* 2.62*  CALCIUM 8.8* 8.7*    COAG Lab Results  Component Value Date   INR 0.9 06/09/2020   No results found for: PTT  Assessment/Planning:  55 year old female with progression of her stage V CKD that vascular surgery was consulted for permanent dialysis access.  Given history of stroke she is now left-hand-dominant.  We discussed plans for right arm access and she has very marginal surface veins on vein mapping and high likelihood for AV graft.    She is now agreeable to proceed.  I will post for right arm AV fistula versus graft tomorrow with one of my partners and please keep n.p.o. after midnight.  Marty Heck 10/01/2020 9:14 AM --

## 2020-10-01 NOTE — Progress Notes (Signed)
PROGRESS NOTE    Hannah [name]  [id number] DOB: 1966-01-05 DOA: 09/26/2020 PCP: Vernie Shanks, MD   Chief Complaint  Patient presents with  . Leg Swelling  Brief Narrative: 55 year old female with CKD 5, anemia, CVA, hypertension, diabetes, hyperlipidemia, multiple sclerosis presents with shortness of breath, lower extremity swelling, onset 2 to 3 days prior to admission.  Patient was seen by PCP and sent to the ED for evaluation.  Recently admitted 2 weeks ago for AKI on CKD 4-renal ultrasound was unremarkable believed to be progression of her CKD, her blood pressure medication were adjusted and was planning for nephrology outpatient follow-up. She was seen in the ED creatinine 5.3 similar to previous with anemia elevated BNP DVT study was negative chest x-ray showed ill-defined opacity consistent with pneumonia in the right middle and lower lobes and some atelectasis.  Nephrology was consulted with her volume overload likely approaching ESRD, patient was admitted for further management. Seen by nephrology started on hemodialysis.  Subjective: Seen/examined On [place] placed last night,  multile complaints- weakness, can't keep leg still, "I think I have restless legs". Has not been getting for tizanidine-resumed. Just vomited this am No diarrhea or chest pain or SOB.   Assessment & Plan:  Volume overload Longstanding CKD that has progressed to ESRD Uremia/metabolic acidosis: Now on dialysis, managed by nephrology.  Vascular surgery consulted for permanent access-aVF versus AVG tomorrow. Continue to monitor electrolytes fluid status.  Continues to have nausea vomiting continue Reglan and Zofran.  CL IP process underway.  Acidosis has resolved.  PTH 84 no treatment needed.  Essential hypertension, borderline controlled continue amlodipine hydralazine.  Monitor and adjust medication.  Type 2 diabetes mellitus hemoglobin A1c 8.7 most recently.  Continue Lantus 10 unit nightly, sliding  scale insulin and continue to hold home Tresiba lispro metformin and glipizide. Recent Labs  Lab 09/30/20 1635 09/30/20 2104 10/01/20 0633 10/01/20 0802 10/01/20 1123  GLUCAP 106* 249* 113* 149* 228*   History of CVA/Hyperlipidemia: On Crestor and Plavix  Anemia of chronic disease/renal disease ferric gluconate ordered along with ESA by nephrology monitor H&H Recent Labs  Lab 09/26/20 2127 09/27/20 0330 09/29/20 1634 10/01/20 0246  HGB 8.2* 8.0* 8.4* 7.8*  HCT 25.5* 24.9* 26.2* 24.7*   Multiple sclerosis: Not currently on treatment last MRI with any active lesions.  Diet Order            Diet renal/carb modified with fluid restriction Diet-HS Snack? Nothing; Fluid restriction: 1200 mL Fluid; Room service appropriate? Yes; Fluid consistency: Thin  Diet effective now                Patient's Body mass index is 25.04 kg/m. DVT prophylaxis: heparin injection 5,000 Units Start: 09/30/20 0600 Code Status:   Code Status: Full Code  Family Communication: plan of care discussed with patient at bedside.  Status is: Inpatient Remains inpatient appropriate because:IV treatments appropriate due to intensity of illness or inability to take PO and Inpatient level of care appropriate due to severity of illness  Dispo: The patient is from: Home              Anticipated d/c is to: Home.  Continue PT OT has suggested outpatient PT/supervision assistant              Patient currently is not medically stable to d/c.   Difficult to place patient No  Unresulted Labs (From admission, onward)          Start  Ordered   09/28/20 0500  Renal function panel  Daily,   R      09/27/20 1218   Signed and Held  Hepatitis B surface antigen  (New Patient [facility] (Hepatitis B))  Once,   R        Signed and Held   Signed and Held  Hepatitis B surface antibody  (New Patient [facility] (Hepatitis B))  Once,   R        Signed and Held   Signed and Held  Hepatitis B core antibody, total  (New  Patient [facility] (Hepatitis B))  Once,   R        Signed and Held   Signed and Held  Hepatitis C antibody  (New Patient [facility] (Hepatitis B))  Once,   R        Signed and Held         Medications reviewed:  Scheduled Meds: . amLODipine  10 mg Oral Daily  . Chlorhexidine Gluconate Cloth  6 each Topical Q0600  . darbepoetin (ARANESP) injection - DIALYSIS  150 mcg Intravenous Q Mon-HD  . famotidine  20 mg Oral Daily  . FLUoxetine  20 mg Oral Daily  . heparin  5,000 Units Subcutaneous Q8H  . hydrALAZINE  50 mg Oral Q8H  . insulin aspart  0-6 Units Subcutaneous TID WC  . insulin glargine  10 Units Subcutaneous QHS  . melatonin  3 mg Oral QHS  . metoCLOPramide  5 mg Oral BID  . rosuvastatin  10 mg Oral Daily  . sodium chloride flush  3 mL Intravenous Q12H   Continuous Infusions: . sodium chloride Stopped (09/28/20 1354)  . ferric gluconate (FERRLECIT/NULECIT) IV 250 mg (09/30/20 1628)    Consultants:see note  Procedures:see note  Antimicrobials: Anti-infectives (From admission, onward)   Start     Dose/Rate Route Frequency Ordered Stop   09/29/20 1330  ceFAZolin (ANCEF) 2-4 GM/100ML-% IVPB       Note to Pharmacy: Cecile Sheerer   : cabinet override      09/29/20 1330 09/30/20 0144   09/29/20 0000  ceFAZolin (ANCEF) 1 g in sodium chloride 0.9 % 100 mL IVPB        1 g 200 mL/hr over 30 Minutes Intravenous To Radiology 09/27/20 1457 09/29/20 1419     Culture/Microbiology No results found for: SDES, SPECREQUEST, CULT, REPTSTATUS  Other culture-see note  Objective: Vitals: Today's Vitals   10/01/20 0050 10/01/20 0444 10/01/20 0500 10/01/20 0758  BP:  (!) 167/78  (!) 174/85  Pulse:  97  (!) 103  Resp:    17  Temp:  98.3 F (36.8 C)  98 F (36.7 C)  TempSrc:  Oral  Oral  SpO2:  98%  100%  Weight:   62.1 kg   Height:      PainSc: Asleep       Intake/Output Summary (Last 24 hours) at 10/01/2020 0843 Last data filed at 09/30/2020 1700 Gross per 24 hour  Intake  120 ml  Output 350 ml  Net -230 ml   Filed Weights   09/30/20 1303 09/30/20 1609 10/01/20 0500  Weight: 58.5 kg 58.5 kg 62.1 kg   Weight change: -1.5 kg  Intake/Output from previous day: 05/17 0701 - 05/18 0700 In: 120 [P.O.:120] Out: 350 [Urine:350] Intake/Output this shift: No intake/output data recorded. Filed Weights   09/30/20 1303 09/30/20 1609 10/01/20 0500  Weight: 58.5 kg 58.5 kg 62.1 kg    Examination: General exam:  AAO x3, obese,ill looking, older than stated age, weak appearing. HEENT:Oral mucosa moist, Ear/Nose WNL grossly,dentition normal. Respiratory system: bilaterally diminished, no use of accessory muscle, non tender. Cardiovascular system: S1 & S2 +,No JVD. Gastrointestinal system: Abdomen soft, NT,ND, BS+. Nervous System:Alert, awake, moving extremities Extremities: no edema, distal peripheral pulses palpable.  Skin: No rashes,no icterus. MSK: Normal muscle bulk,tone, power  Data Reviewed: I have personally reviewed following labs and imaging studies CBC: Recent Labs  Lab 09/26/20 2127 09/27/20 0330 09/29/20 1634 10/01/20 0246  WBC 7.0 6.3 6.6 7.8  NEUTROABS 4.6  --   --   --   HGB 8.2* 8.0* 8.4* 7.8*  HCT 25.5* 24.9* 26.2* 24.7*  MCV 87.0 86.8 87.6 88.2  PLT 222 213 218 0000000   Basic Metabolic Panel: Recent Labs  Lab 09/26/20 1731 09/27/20 0330 09/28/20 0239 09/29/20 0037 09/30/20 0426 10/01/20 0246  NA 137 138 139 136 137 132*  K 4.1 3.8 3.7 3.7 3.7 3.7  CL 106 106 103 101 103 98  CO2 '23 22 26 27 28 25  '$ GLUCOSE 175* 116* 167* 225* 99 158*  BUN 37* 32* 32* 32* 14 6  CREATININE 5.34* 5.45* 5.45* 5.78* 3.75* 2.62*  CALCIUM 8.8* 8.7* 8.9 8.5* 8.8* 8.7*  MG 2.1  --   --   --   --   --   PHOS 4.2 4.4 4.9* 4.9* 3.7 2.7   GFR: Estimated Creatinine Clearance: 20.8 mL/min (A) (by C-G formula based on SCr of 2.62 mg/dL (H)). Liver Function Tests: Recent Labs  Lab 09/27/20 0330 09/28/20 0239 09/29/20 0037 09/30/20 0426 10/01/20 0246   ALBUMIN 2.7* 2.5* 2.5* 2.7* 2.6*   No results for input(s): LIPASE, AMYLASE in the last 168 hours. No results for input(s): AMMONIA in the last 168 hours. Coagulation Profile: No results for input(s): INR, PROTIME in the last 168 hours. Cardiac Enzymes: No results for input(s): CKTOTAL, CKMB, CKMBINDEX, TROPONINI in the last 168 hours. BNP (last 3 results) No results for input(s): PROBNP in the last 8760 hours. HbA1C: No results for input(s): HGBA1C in the last 72 hours. CBG: Recent Labs  Lab 09/30/20 1343 09/30/20 1635 09/30/20 2104 10/01/20 0633 10/01/20 0802  GLUCAP 161* 106* 249* 113* 149*   Lipid Profile: No results for input(s): CHOL, HDL, LDLCALC, TRIG, CHOLHDL, LDLDIRECT in the last 72 hours. Thyroid Function Tests: No results for input(s): TSH, T4TOTAL, FREET4, T3FREE, THYROIDAB in the last 72 hours. Anemia Panel: No results for input(s): VITAMINB12, FOLATE, FERRITIN, TIBC, IRON, RETICCTPCT in the last 72 hours. Sepsis Labs: No results for input(s): PROCALCITON, LATICACIDVEN in the last 168 hours.  Recent Results (from the past 240 hour(s))  Resp Panel by RT-PCR (Flu A&B, Covid) Nasopharyngeal Swab     Status: None   Collection Time: 09/26/20  9:27 PM   Specimen: Nasopharyngeal Swab; Nasopharyngeal(NP) swabs in vial transport medium  Result Value Ref Range Status   SARS Coronavirus 2 by RT PCR NEGATIVE NEGATIVE Final    Comment: (NOTE) SARS-CoV-2 target nucleic acids are NOT DETECTED.  The SARS-CoV-2 RNA is generally detectable in upper respiratory specimens during the acute phase of infection. The lowest concentration of SARS-CoV-2 viral copies this assay can detect is 138 copies/mL. A negative result does not preclude SARS-Cov-2 infection and should not be used as the sole basis for treatment or other patient management decisions. A negative result may occur with  improper specimen collection/handling, submission of specimen other than nasopharyngeal swab,  presence of viral mutation(s) within the  areas targeted by this assay, and inadequate number of viral copies(<138 copies/mL). A negative result must be combined with clinical observations, patient history, and epidemiological information. The expected result is Negative.  Fact Sheet for Patients:  EntrepreneurPulse.com.au  Fact Sheet for Healthcare Providers:  IncredibleEmployment.be  This test is no t yet approved or cleared by the [place] FDA and  has been authorized for detection and/or diagnosis of SARS-CoV-2 by FDA under an Emergency Use Authorization (EUA). This EUA will remain  in effect (meaning this test can be used) for the duration of the COVID-19 declaration under Section 564(b)(1) of the Act, 21 U.S.C.section 360bbb-3(b)(1), unless the authorization is terminated  or revoked sooner.       Influenza A by PCR NEGATIVE NEGATIVE Final   Influenza B by PCR NEGATIVE NEGATIVE Final    Comment: (NOTE) The Xpert Xpress SARS-CoV-2/FLU/RSV plus assay is intended as an aid in the diagnosis of influenza from Nasopharyngeal swab specimens and should not be used as a sole basis for treatment. Nasal washings and aspirates are unacceptable for Xpert Xpress SARS-CoV-2/FLU/RSV testing.  Fact Sheet for Patients: EntrepreneurPulse.com.au  Fact Sheet for Healthcare Providers: IncredibleEmployment.be  This test is not yet approved or cleared by the [place] FDA and has been authorized for detection and/or diagnosis of SARS-CoV-2 by FDA under an Emergency Use Authorization (EUA). This EUA will remain in effect (meaning this test can be used) for the duration of the COVID-19 declaration under Section 564(b)(1) of the Act, 21 U.S.C. section 360bbb-3(b)(1), unless the authorization is terminated or revoked.  Performed at [facility], [place] [address] [place], [place] [address]       Radiology Studies: IR Fluoro Guide CV Line Right  Result Date: 09/29/2020 INDICATION: 55 year old with chronic kidney disease and needs a hemodialysis catheter. EXAM: FLUOROSCOPIC AND ULTRASOUND GUIDED PLACEMENT OF A TUNNELED DIALYSIS CATHETER Physician: Stephan Minister. Anselm Pancoast, MD MEDICATIONS: Ancef 1 g; The antibiotic was administered within an appropriate time interval prior to skin puncture. ANESTHESIA/SEDATION: Versed 1.0 mg IV; Fentanyl 25 mcg IV; Moderate Sedation Time:  18 minutes The patient was continuously monitored during the procedure by the interventional radiology nurse under my direct supervision. FLUOROSCOPY TIME:  12 seconds, 1 mGy COMPLICATIONS: None immediate. PROCEDURE: Informed consent was obtained for placement of a tunneled dialysis catheter. The patient was placed supine on the interventional table. Ultrasound confirmed a patent right internal jugular vein. Ultrasound images were obtained for documentation. The right neck and chest was prepped and draped in a sterile fashion. Maximal barrier sterile technique was utilized including caps, mask, sterile gowns, sterile gloves, sterile drape, hand hygiene and skin antiseptic. The right neck was anesthetized with 1% lidocaine. A small incision was made with #11 blade scalpel. A 21 gauge needle directed into the right internal jugular vein with ultrasound guidance. A micropuncture dilator set was placed. A 19 cm tip to cuff Palindrome catheter was selected. The skin below the right clavicle was anesthetized and a small incision was made with an #11 blade scalpel. A subcutaneous tunnel was formed to the vein dermatotomy site. The catheter was brought through the tunnel. The vein dermatotomy site was dilated to accommodate a peel-away sheath. The catheter was placed through the peel-away sheath and directed into the central venous structures. The tip of the catheter was placed at the superior cavoatrial junction with fluoroscopy. Fluoroscopic images  were obtained for documentation. Both lumens were found to aspirate and flush well. The proper amount of heparin was flushed  in both lumens. The vein dermatotomy site was closed using a single layer of absorbable suture and Dermabond. Surgifoam was placed in subcutaneous tract. The catheter was secured to the skin using Prolene suture. IMPRESSION: Successful placement of a right jugular tunneled dialysis catheter using ultrasound and fluoroscopic guidance. Electronically Signed   By: Markus Daft M.D.   On: 09/29/2020 17:35   IR US Guide Vasc Access Right  Result Date: 09/29/2020 INDICATION: 55 year old with chronic kidney disease and needs a hemodialysis catheter. EXAM: FLUOROSCOPIC AND ULTRASOUND GUIDED PLACEMENT OF A TUNNELED DIALYSIS CATHETER Physician: Stephan Minister. Anselm Pancoast, MD MEDICATIONS: Ancef 1 g; The antibiotic was administered within an appropriate time interval prior to skin puncture. ANESTHESIA/SEDATION: Versed 1.0 mg IV; Fentanyl 25 mcg IV; Moderate Sedation Time:  18 minutes The patient was continuously monitored during the procedure by the interventional radiology nurse under my direct supervision. FLUOROSCOPY TIME:  12 seconds, 1 mGy COMPLICATIONS: None immediate. PROCEDURE: Informed consent was obtained for placement of a tunneled dialysis catheter. The patient was placed supine on the interventional table. Ultrasound confirmed a patent right internal jugular vein. Ultrasound images were obtained for documentation. The right neck and chest was prepped and draped in a sterile fashion. Maximal barrier sterile technique was utilized including caps, mask, sterile gowns, sterile gloves, sterile drape, hand hygiene and skin antiseptic. The right neck was anesthetized with 1% lidocaine. A small incision was made with #11 blade scalpel. A 21 gauge needle directed into the right internal jugular vein with ultrasound guidance. A micropuncture dilator set was placed. A 19 cm tip to cuff Palindrome catheter was  selected. The skin below the right clavicle was anesthetized and a small incision was made with an #11 blade scalpel. A subcutaneous tunnel was formed to the vein dermatotomy site. The catheter was brought through the tunnel. The vein dermatotomy site was dilated to accommodate a peel-away sheath. The catheter was placed through the peel-away sheath and directed into the central venous structures. The tip of the catheter was placed at the superior cavoatrial junction with fluoroscopy. Fluoroscopic images were obtained for documentation. Both lumens were found to aspirate and flush well. The proper amount of heparin was flushed in both lumens. The vein dermatotomy site was closed using a single layer of absorbable suture and Dermabond. Surgifoam was placed in subcutaneous tract. The catheter was secured to the skin using Prolene suture. IMPRESSION: Successful placement of a right jugular tunneled dialysis catheter using ultrasound and fluoroscopic guidance. Electronically Signed   By: Markus Daft M.D.   On: 09/29/2020 17:35   VAS [place] UPPER EXT VEIN MAPPING (PRE-OP AVF)  Result Date: 09/29/2020 UPPER EXTREMITY VEIN MAPPING Patient Name:  SHAMECA NORGREN  Date of Exam:   09/29/2020 Medical Rec #: [id number]     Accession #:    [id number] Date of Birth: 18-Aug-1965    Patient Gender: F Patient Age:   63Y Exam Location:  [facility] Procedure:      VAS [place] UPPER EXT VEIN MAPPING (PRE-OP AVF) Referring Phys: [id number] KELLIE GOLDSBOROUGH --------------------------------------------------------------------------------  Indications: Pre-access. Comparison Study: no prior Performing Technologist: Abram Sander RVS  Examination Guidelines: A complete evaluation includes B-mode imaging, spectral Doppler, color Doppler, and power Doppler as needed of all accessible portions of each vessel. Bilateral testing is considered an integral part of a complete examination. Limited examinations for reoccurring indications may be performed  as noted. +-----------------+-------------+----------+--------+ Right Cephalic   Diameter (cm)Depth (cm)Findings +-----------------+-------------+----------+--------+ Shoulder  0.22        1.35            +-----------------+-------------+----------+--------+ Prox upper arm       0.20        1.28            +-----------------+-------------+----------+--------+ Mid upper arm        0.21        1.37            +-----------------+-------------+----------+--------+ Dist upper arm       0.10        0.47            +-----------------+-------------+----------+--------+ Antecubital fossa    0.16        0.25            +-----------------+-------------+----------+--------+ Prox forearm         0.21        0.82            +-----------------+-------------+----------+--------+ Mid forearm          0.22        0.76            +-----------------+-------------+----------+--------+ Dist forearm         0.22        0.38            +-----------------+-------------+----------+--------+ Wrist                0.23        0.32            +-----------------+-------------+----------+--------+ +-----------------+-------------+----------+--------+ Right Basilic    Diameter (cm)Depth (cm)Findings +-----------------+-------------+----------+--------+ Prox upper arm       0.35        0.99            +-----------------+-------------+----------+--------+ Mid upper arm        0.27        1.19            +-----------------+-------------+----------+--------+ Dist upper arm       0.29        1.26            +-----------------+-------------+----------+--------+ Antecubital fossa    0.21        1.03            +-----------------+-------------+----------+--------+ Prox forearm         0.21        0.76            +-----------------+-------------+----------+--------+ Mid forearm          0.20        0.46             +-----------------+-------------+----------+--------+ Distal forearm       0.21        0.36            +-----------------+-------------+----------+--------+ Wrist                0.18        0.39            +-----------------+-------------+----------+--------+ +-----------------+-------------+----------+---------+ Left Cephalic    Diameter (cm)Depth (cm)Findings  +-----------------+-------------+----------+---------+ Shoulder             0.31        1.02             +-----------------+-------------+----------+---------+ Prox upper arm       0.24        1.07             +-----------------+-------------+----------+---------+  Mid upper arm        0.13        1.23   branching +-----------------+-------------+----------+---------+ Dist upper arm       0.13        1.05             +-----------------+-------------+----------+---------+ Antecubital fossa    0.20        2.97             +-----------------+-------------+----------+---------+ Prox forearm         0.18        0.65             +-----------------+-------------+----------+---------+ Mid forearm          0.24        0.52             +-----------------+-------------+----------+---------+ Dist forearm         0.22        0.34             +-----------------+-------------+----------+---------+ +-----------------+-------------+----------+---------+ Left Basilic     Diameter (cm)Depth (cm)Findings  +-----------------+-------------+----------+---------+ Prox upper arm       0.38        1.48             +-----------------+-------------+----------+---------+ Mid upper arm        0.25        1.42             +-----------------+-------------+----------+---------+ Dist upper arm       0.27        1.18             +-----------------+-------------+----------+---------+ Antecubital fossa    0.26        0.68             +-----------------+-------------+----------+---------+ Prox forearm         0.22         0.30             +-----------------+-------------+----------+---------+ Mid forearm          0.18        0.23             +-----------------+-------------+----------+---------+ Distal forearm       0.19        0.28             +-----------------+-------------+----------+---------+ Elbow                                   branching +-----------------+-------------+----------+---------+ *See table(s) above for measurements and observations.  Diagnosing physician: Monica Martinez MD Electronically signed by Monica Martinez MD on 09/29/2020 at 4:45:25 PM.    Final      LOS: 4 days   Antonieta Pert, MD [facility]  10/01/2020, 8:43 AM

## 2020-10-01 NOTE — Care Management Important Message (Signed)
Important Message  Patient Details  Name: Hannah [name] MRN: [id number] Date of Birth: 05-05-1966   Medicare Important Message Given:  Yes     Kirke Breach [address] 10/01/2020, 2:51 PM

## 2020-10-01 NOTE — Progress Notes (Signed)
Renal Navigator attempted to meet with patient in room earlier today, but she was sleeping. Navigator met with patient at HD bedside, though not ideal, but Navigator does not want to delay outpatient referral any longer. Patient was extremely sleepy and was unable to hold a conversation with Navigator. She asked Navigator to talk with her daughter [name].  Navigator contacted [phone] who was extremely pleasant and appreciative of the call. Navigator explained role and reason for calling, as well as various modalities and options for placement (TCU vs traditional clinic). Navigator has explained that the TCU is 4 days per week, 3 hours per day and the ideal setting for a new HD patient. Navigator states that if they feel they are interested in Home Therapy (PD or HHD) at anytime in the future, this is where she should start. Even if patient is not sure about Home Therapy, she will get the most education and support in the TCU. Navigator added that patient can also start in a traditional clinic and be safe and supported if they feel they cannot get her to the TCU [facility]) 4 times per week. Home Therapy discussed to ensure that family is aware that patient and family are still followed and supported by a team of MD, RN, and SW even if therapy is done at home. HD is still started in a clinic for at least 4 weeks. [name] states the call was extremely helpful and that she would like to speak with her sister, who is patient's other caregiver, before they decide. Navigator has agreed to call [facility] tomorrow morning before a referral is completed. [name] states she can provide transportation, but that she would also like a transportation application to be completed with her, as she works Tuesday-Friday with her church. She states that, thankfully, her job is flexible.  Navigator will follow closely.   Alphonzo Cruise, [place] [facility]al Navigator [phone]

## 2020-10-01 NOTE — Evaluation (Signed)
Physical Therapy Evaluation Patient Details Name: Hannah [name] MRN: [id number] DOB: [birth year] Today's Date: 10/01/2020   History of Present Illness  Hannah [name] is a 55 y.o. female presented with shortness of breath and lower extremity swelling. Admitted on 09/26/20 with Volume overload.  Additionally, pt with ESRD and HD has been initiated this admission.  Pt for possible AV fistula placement tomorrow. Pt with medical history significant of CKD 5, anemia, CVA, diabetes, hypertension, hyperlipidemia, multiple sclerosis  Clinical Impression  Pt admitted with above diagnosis. Pt is limited ambulator at baseline due to prior CVA and R sided weakness and MS.  Today she required min guard for safety and was able to ambulate with RW in room.  Pt was lethargic at time of PT evaluation (suspect medication related), VSS, but distance limited due to lethargy. Pt has necessary DME and support at home.  She would like outpt PT to further mobility. Pt currently with functional limitations due to the deficits listed below (see PT Problem List). Pt will benefit from skilled PT to increase their independence and safety with mobility to allow discharge to the venue listed below.       Follow Up Recommendations Outpatient PT;Supervision/Assistance - 24 hour (pt prefers outpt PT)    Equipment Recommendations  None recommended by PT    Recommendations for Other Services       Precautions / Restrictions Precautions Precautions: Fall Precaution Comments: Baseline RT sided weakness due to CVA Restrictions Weight Bearing Restrictions: No      Mobility  Bed Mobility Overal bed mobility: Needs Assistance Bed Mobility: Sit to Supine       Sit to supine: Min guard   General bed mobility comments: increased time but no assist    Transfers Overall transfer level: Needs assistance Equipment used: Rolling walker (2 wheeled) Transfers: Sit to/from Stand Sit to Stand: Min guard Stand pivot transfers:  Min guard       General transfer comment: Min guard and increased time to rise but no assist needed  Ambulation/Gait Ambulation/Gait assistance: Min guard Gait Distance (Feet): 6 Feet Assistive device: Rolling walker (2 wheeled) Gait Pattern/deviations: Step-to pattern;Decreased stance time - right;Decreased dorsiflexion - right;Decreased stride length Gait velocity: decreased   General Gait Details: Only ambulated chair to bed due to lethargy -likely medication related.  She ambulated to door and back with OT earlier.  Pt requiring min guard for safety and cues for posture and positioning  Stairs            Wheelchair Mobility    Modified Rankin (Stroke Patients Only)       Balance Overall balance assessment: Needs assistance Sitting-balance support: No upper extremity supported;Feet supported Sitting balance-Leahy Scale: Good     Standing balance support: Bilateral upper extremity supported Standing balance-Leahy Scale: Poor Standing balance comment: required RW                             Pertinent Vitals/Pain Pain Assessment: 0-10 Pain Score: 5  Pain Location: right up chest where dialysis access currently Pain Descriptors / Indicators: Sore Pain Intervention(s): Limited activity within patient's tolerance;Monitored during session;Premedicated before session    Home Living Family/patient expects to be discharged to:: Private residence Living Arrangements: Children Available Help at Discharge: Family;Available 24 hours/day Type of Home: Apartment Home Access: Stairs to enter Entrance Stairs-Rails: None Entrance Stairs-Number of Steps: 1 step up on curb Home Layout: One level Home Equipment: Walker - 4 wheels;Cane -  single point;Wheelchair - Education officer, community - power;Tub bench      Prior Function Level of Independence: Needs assistance   Gait / Transfers Assistance Needed: amb with RW short distances in apt and stated that her daughter is  always holding her, also uses power chair vs manual chair inside and for longer distances  ADL's / Homemaking Assistance Needed: Pt reports she can bath/dress/toilet herself (dtr in room and does not deny this)  Comments: Reports has AFO at home but does not regularly wear     Hand Dominance   Dominant Hand: Right (but mainly uses L due to deficits from CVA)    Extremity/Trunk Assessment   Upper Extremity Assessment Upper Extremity Assessment: Defer to OT evaluation RUE Deficits / Details: Baseline deficits. Very limited shoulder AROM to ~70 degrees and PROM to 90. Elbow to hand WFL, but weak, grossly 3/5. Able to use it to donn socks sitting EOB by crossing legs RUE Coordination: decreased fine motor;decreased gross motor    Lower Extremity Assessment Lower Extremity Assessment: LLE deficits/detail;RLE deficits/detail RLE Deficits / Details: ROM : ankle only to neutral, otherwise WFL; MMT: 3/5 throughout LLE Deficits / Details: ROM grossly WFL, MMT 4+/5    Cervical / Trunk Assessment Cervical / Trunk Assessment: Normal  Communication   Communication:  (mild dysarthria)  Cognition Arousal/Alertness: Lethargic Behavior During Therapy: WFL for tasks assessed/performed Overall Cognitive Status: Within Functional Limits for tasks assessed                                 General Comments: Pt had been given pain meds prior to PT session and was lethargic      General Comments General comments (skin integrity, edema, etc.): Pt reports lethargy and needing to return to bed.  States "feels drunk."  O2 sats 97%, HR 97 bpm, BP 136/72. Had received pain meds prior to PT    Exercises     Assessment/Plan    PT Assessment Patient needs continued PT services  PT Problem List Decreased strength;Decreased mobility;Decreased activity tolerance;Decreased balance;Decreased coordination;Decreased safety awareness;Decreased range of motion       PT Treatment Interventions DME  instruction;Therapeutic exercise;Gait training;Functional mobility training;Therapeutic activities;Patient/family education;Balance training;Stair training    PT Goals (Current goals can be found in the Care Plan section)  Acute Rehab PT Goals Patient Stated Goal: to go home PT Goal Formulation: With patient Time For Goal Achievement: 10/15/20 Potential to Achieve Goals: Good    Frequency Min 3X/week   Barriers to discharge        Co-evaluation               AM-PAC PT "6 Clicks" Mobility  Outcome Measure Help needed turning from your back to your side while in a flat bed without using bedrails?: A Little Help needed moving from lying on your back to sitting on the side of a flat bed without using bedrails?: A Little Help needed moving to and from a bed to a chair (including a wheelchair)?: A Little Help needed standing up from a chair using your arms (e.g., wheelchair or bedside chair)?: A Little Help needed to walk in hospital room?: A Little Help needed climbing 3-5 steps with a railing? : A Little 6 Click Score: 18    End of Session Equipment Utilized During Treatment: Gait belt Activity Tolerance: Patient tolerated treatment well Patient left: with call bell/phone within reach;with family/visitor present;in bed;with bed alarm set Nurse  Communication: Mobility status PT Visit Diagnosis: Other abnormalities of gait and mobility (R26.89);Muscle weakness (generalized) (M62.81)    Time: [id number] PT Time Calculation (min) (ACUTE ONLY): 20 min   Charges:   PT Evaluation $PT Eval Low Complexity: 1 Low          Daimon Kean, PT Acute Rehab Services Pager [phone] [name] [phone]    Karlton Lemon 10/01/2020, 12:25 PM

## 2020-10-01 NOTE — Progress Notes (Signed)
   09/30/20 2058  Assess: MEWS Score  Temp 98.5 F (36.9 C)  BP (!) 186/84  Pulse Rate (!) 115  Resp 18  Level of Consciousness Alert  SpO2 100 %  O2 Device Room Air  Assess: MEWS Score  MEWS Temp 0  MEWS Systolic 0  MEWS Pulse 2  MEWS RR 0  MEWS LOC 0  MEWS Score 2  MEWS Score Color Yellow  Assess: if the MEWS score is Yellow or Red  Were vital signs taken at a resting state? Yes  Focused Assessment No change from prior assessment  Early Detection of Sepsis Score *See Row Information* Low  MEWS guidelines implemented *See Row Information* No, previously yellow, continue vital signs every 4 hours  Treat  MEWS Interventions Administered scheduled meds/treatments  Pain Scale 0-10  Pain Score 0

## 2020-10-01 NOTE — Progress Notes (Signed)
Subjective: States she is not feeling that well today.  Having some nausea.  Threw up this morning.  Some pain in her lower extremities.  She met with Dr. Carlis Abbott and plans to have access procedure tomorrow. Feels like she is dehydrated today.  Objective Vital signs in last 24 hours: Vitals:   09/30/20 2348 10/01/20 0444 10/01/20 0500 10/01/20 0758  BP: (!) 178/82 (!) 167/78  (!) 174/85  Pulse: (!) 116 97  (!) 103  Resp:    17  Temp: 98.4 F (36.9 C) 98.3 F (36.8 C)  98 F (36.7 C)  TempSrc: Oral Oral  Oral  SpO2: 100% 98%  100%  Weight:   62.1 kg   Height:       Weight change: -1.5 kg  Intake/Output Summary (Last 24 hours) at 10/01/2020 1115 Last data filed at 09/30/2020 1700 Gross per 24 hour  Intake 120 ml  Output 350 ml  Net -230 ml    Assessment/Plan: [name] with DM and HTN with progressive CKD and now what appears to be uremic symptoms.  My recommendation would be to initiate dialysis this admission 1.Renal- advanced CKD that has been longstanding now felt to be ESRD-  Has now progressed to the point where she is uremic and deemed to be ESRD needing to start dialysis.   CL IP process underway.  Getting AVF vs AVG tomorrow. 3/3 initiation HD sessions today then maintain MWF schedule for now. 2. Hypertension/volume  - BP high and trying to remove UF with HD but patient symptoms limiting. 3. Anemia  - is quite anemic-   iron stores low, s/p repletion and now on ESA 164mg weekly. 4. Bones-  PTH 84. No treatment needed 5. Acidosis- Resolved    [name]   Labs: Basic Metabolic Panel: Recent Labs  Lab 09/29/20 0037 09/30/20 0426 10/01/20 0246  NA 136 137 132*  K 3.7 3.7 3.7  CL 101 103 98  CO2 _0 GLUCOSE 225* 99 158*  BUN 32* 14 6  CREATININE 5.78* 3.75* 2.62*  CALCIUM 8.5* 8.8* 8.7*  PHOS 4.9* 3.7 2.7   Liver Function Tests: Recent Labs  Lab 09/29/20 0037 09/30/20 0426 10/01/20 0246  ALBUMIN 2.5* 2.7* 2.6*   No results for  input(s): LIPASE, AMYLASE in the last 168 hours. No results for input(s): AMMONIA in the last 168 hours. CBC: Recent Labs  Lab 09/26/20 2127 09/27/20 0330 09/29/20 1634 10/01/20 0246  WBC 7.0 6.3 6.6 7.8  NEUTROABS 4.6  --   --   --   HGB 8.2* 8.0* 8.4* 7.8*  HCT 25.5* 24.9* 26.2* 24.7*  MCV 87.0 86.8 87.6 88.2  PLT 222 213 218 188   Cardiac Enzymes: No results for input(s): CKTOTAL, CKMB, CKMBINDEX, TROPONINI in the last 168 hours. CBG: Recent Labs  Lab 09/30/20 1343 09/30/20 1635 09/30/20 2104 10/01/20 0633 10/01/20 0802  GLUCAP 161* 106* 249* 113* 149*    Iron Studies:  No results for input(s): IRON, TIBC, TRANSFERRIN, FERRITIN in the last 72 hours. Studies/Results: IR Fluoro Guide CV Line Right  Result Date: 09/29/2020 INDICATION: [age]year old with chronic kidney disease and needs a hemodialysis catheter. EXAM: FLUOROSCOPIC AND ULTRASOUND GUIDED PLACEMENT OF A TUNNELED DIALYSIS CATHETER Physician: AStephan Minister HAnselm Pancoast MD MEDICATIONS: Ancef 1 g; The antibiotic was administered within an appropriate time interval prior to skin puncture. ANESTHESIA/SEDATION: Versed 1.0 mg IV; Fentanyl 25 mcg IV; Moderate Sedation Time:  18 minutes The patient was continuously monitored during  the procedure by the interventional radiology nurse under my direct supervision. FLUOROSCOPY TIME:  12 seconds, 1 mGy COMPLICATIONS: None immediate. PROCEDURE: Informed consent was obtained for placement of a tunneled dialysis catheter. The patient was placed supine on the interventional table. Ultrasound confirmed a patent right internal jugular vein. Ultrasound images were obtained for documentation. The right neck and chest was prepped and draped in a sterile fashion. Maximal barrier sterile technique was utilized including caps, mask, sterile gowns, sterile gloves, sterile drape, hand hygiene and skin antiseptic. The right neck was anesthetized with 1% lidocaine. A small incision was made with #11 blade scalpel. A  21 gauge needle directed into the right internal jugular vein with ultrasound guidance. A micropuncture dilator set was placed. A 19 cm tip to cuff Palindrome catheter was selected. The skin below the right clavicle was anesthetized and a small incision was made with an #11 blade scalpel. A subcutaneous tunnel was formed to the vein dermatotomy site. The catheter was brought through the tunnel. The vein dermatotomy site was dilated to accommodate a peel-away sheath. The catheter was placed through the peel-away sheath and directed into the central venous structures. The tip of the catheter was placed at the superior cavoatrial junction with fluoroscopy. Fluoroscopic images were obtained for documentation. Both lumens were found to aspirate and flush well. The proper amount of heparin was flushed in both lumens. The vein dermatotomy site was closed using a single layer of absorbable suture and Dermabond. Surgifoam was placed in subcutaneous tract. The catheter was secured to the skin using Prolene suture. IMPRESSION: Successful placement of a right jugular tunneled dialysis catheter using ultrasound and fluoroscopic guidance. Electronically Signed   By: Markus Daft M.D.   On: 09/29/2020 17:35   IR US Guide Vasc Access Right  Result Date: 09/29/2020 INDICATION: 55 year old with chronic kidney disease and needs a hemodialysis catheter. EXAM: FLUOROSCOPIC AND ULTRASOUND GUIDED PLACEMENT OF A TUNNELED DIALYSIS CATHETER Physician: Stephan Minister. Anselm Pancoast, MD MEDICATIONS: Ancef 1 g; The antibiotic was administered within an appropriate time interval prior to skin puncture. ANESTHESIA/SEDATION: Versed 1.0 mg IV; Fentanyl 25 mcg IV; Moderate Sedation Time:  18 minutes The patient was continuously monitored during the procedure by the interventional radiology nurse under my direct supervision. FLUOROSCOPY TIME:  12 seconds, 1 mGy COMPLICATIONS: None immediate. PROCEDURE: Informed consent was obtained for placement of a tunneled  dialysis catheter. The patient was placed supine on the interventional table. Ultrasound confirmed a patent right internal jugular vein. Ultrasound images were obtained for documentation. The right neck and chest was prepped and draped in a sterile fashion. Maximal barrier sterile technique was utilized including caps, mask, sterile gowns, sterile gloves, sterile drape, hand hygiene and skin antiseptic. The right neck was anesthetized with 1% lidocaine. A small incision was made with #11 blade scalpel. A 21 gauge needle directed into the right internal jugular vein with ultrasound guidance. A micropuncture dilator set was placed. A 19 cm tip to cuff Palindrome catheter was selected. The skin below the right clavicle was anesthetized and a small incision was made with an #11 blade scalpel. A subcutaneous tunnel was formed to the vein dermatotomy site. The catheter was brought through the tunnel. The vein dermatotomy site was dilated to accommodate a peel-away sheath. The catheter was placed through the peel-away sheath and directed into the central venous structures. The tip of the catheter was placed at the superior cavoatrial junction with fluoroscopy. Fluoroscopic images were obtained for documentation. Both lumens were found to aspirate  and flush well. The proper amount of heparin was flushed in both lumens. The vein dermatotomy site was closed using a single layer of absorbable suture and Dermabond. Surgifoam was placed in subcutaneous tract. The catheter was secured to the skin using Prolene suture. IMPRESSION: Successful placement of a right jugular tunneled dialysis catheter using ultrasound and fluoroscopic guidance. Electronically Signed   By: Markus Daft M.D.   On: 09/29/2020 17:35   VAS [place] UPPER EXT VEIN MAPPING (PRE-OP AVF)  Result Date: 09/29/2020 UPPER EXTREMITY VEIN MAPPING Patient Name:  WAYLYNN BENEFIEL  Date of Exam:   09/29/2020 Medical Rec #: [id number]     Accession #:    [id number] Date of Birth:  12-15-1965    Patient Gender: F Patient Age:   61Y Exam Location:  [facility] Procedure:      VAS [place] UPPER EXT VEIN MAPPING (PRE-OP AVF) Referring Phys: [id number] KELLIE GOLDSBOROUGH --------------------------------------------------------------------------------  Indications: Pre-access. Comparison Study: no prior Performing Technologist: Abram Sander RVS  Examination Guidelines: A complete evaluation includes B-mode imaging, spectral Doppler, color Doppler, and power Doppler as needed of all accessible portions of each vessel. Bilateral testing is considered an integral part of a complete examination. Limited examinations for reoccurring indications may be performed as noted. +-----------------+-------------+----------+--------+ Right Cephalic   Diameter (cm)Depth (cm)Findings +-----------------+-------------+----------+--------+ Shoulder             0.22        1.35            +-----------------+-------------+----------+--------+ Prox upper arm       0.20        1.28            +-----------------+-------------+----------+--------+ Mid upper arm        0.21        1.37            +-----------------+-------------+----------+--------+ Dist upper arm       0.10        0.47            +-----------------+-------------+----------+--------+ Antecubital fossa    0.16        0.25            +-----------------+-------------+----------+--------+ Prox forearm         0.21        0.82            +-----------------+-------------+----------+--------+ Mid forearm          0.22        0.76            +-----------------+-------------+----------+--------+ Dist forearm         0.22        0.38            +-----------------+-------------+----------+--------+ Wrist                0.23        0.32            +-----------------+-------------+----------+--------+ +-----------------+-------------+----------+--------+ Right Basilic    Diameter (cm)Depth (cm)Findings  +-----------------+-------------+----------+--------+ Prox upper arm       0.35        0.99            +-----------------+-------------+----------+--------+ Mid upper arm        0.27        1.19            +-----------------+-------------+----------+--------+ Dist upper arm       0.29        1.26            +-----------------+-------------+----------+--------+  Antecubital fossa    0.21        1.03            +-----------------+-------------+----------+--------+ Prox forearm         0.21        0.76            +-----------------+-------------+----------+--------+ Mid forearm          0.20        0.46            +-----------------+-------------+----------+--------+ Distal forearm       0.21        0.36            +-----------------+-------------+----------+--------+ Wrist                0.18        0.39            +-----------------+-------------+----------+--------+ +-----------------+-------------+----------+---------+ Left Cephalic    Diameter (cm)Depth (cm)Findings  +-----------------+-------------+----------+---------+ Shoulder             0.31        1.02             +-----------------+-------------+----------+---------+ Prox upper arm       0.24        1.07             +-----------------+-------------+----------+---------+ Mid upper arm        0.13        1.23   branching +-----------------+-------------+----------+---------+ Dist upper arm       0.13        1.05             +-----------------+-------------+----------+---------+ Antecubital fossa    0.20        2.97             +-----------------+-------------+----------+---------+ Prox forearm         0.18        0.65             +-----------------+-------------+----------+---------+ Mid forearm          0.24        0.52             +-----------------+-------------+----------+---------+ Dist forearm         0.22        0.34              +-----------------+-------------+----------+---------+ +-----------------+-------------+----------+---------+ Left Basilic     Diameter (cm)Depth (cm)Findings  +-----------------+-------------+----------+---------+ Prox upper arm       0.38        1.48             +-----------------+-------------+----------+---------+ Mid upper arm        0.25        1.42             +-----------------+-------------+----------+---------+ Dist upper arm       0.27        1.18             +-----------------+-------------+----------+---------+ Antecubital fossa    0.26        0.68             +-----------------+-------------+----------+---------+ Prox forearm         0.22        0.30             +-----------------+-------------+----------+---------+ Mid forearm          0.18        0.23             +-----------------+-------------+----------+---------+  Distal forearm       0.19        0.28             +-----------------+-------------+----------+---------+ Elbow                                   branching +-----------------+-------------+----------+---------+ *See table(s) above for measurements and observations.  Diagnosing physician: Monica Martinez MD Electronically signed by Monica Martinez MD on 09/29/2020 at 4:45:25 PM.    Final    Medications: Infusions: . sodium chloride Stopped (09/28/20 1354)  . ferric gluconate (FERRLECIT/NULECIT) IV 250 mg (10/01/20 1051)    Scheduled Medications: . amLODipine  10 mg Oral Daily  . Chlorhexidine Gluconate Cloth  6 each Topical Q0600  . darbepoetin (ARANESP) injection - DIALYSIS  150 mcg Intravenous Q Mon-HD  . famotidine  20 mg Oral Daily  . FLUoxetine  20 mg Oral Daily  . heparin  5,000 Units Subcutaneous Q8H  . hydrALAZINE  50 mg Oral Q8H  . insulin aspart  0-6 Units Subcutaneous TID WC  . insulin glargine  10 Units Subcutaneous QHS  . melatonin  3 mg Oral QHS  . metoCLOPramide  5 mg Oral BID  . rosuvastatin  10 mg Oral  Daily  . sodium chloride flush  3 mL Intravenous Q12H  . tiZANidine  2 mg Oral Q8H    have reviewed scheduled and prn medications.  Physical Exam: General:  NAD-  Lying in bed Heart: RRR Lungs: No increased work of breathing, bilateral chest rise Abdomen: soft, non tender Extremities: Warm and well perfused    10/01/2020,11:15 AM  LOS: 4 days

## 2020-10-01 NOTE — H&P (View-Only) (Signed)
[facility]  Subjective  - she is now agreeable to proceed with right arm AV fistula access versus graft.   Objective (!) 174/85 (!) 103 98 F (36.7 C) (Oral) 17 100%  Intake/Output Summary (Last 24 hours) at 10/01/2020 0914 Last data filed at 09/30/2020 1700 Gross per 24 hour  Intake 120 ml  Output 350 ml  Net -230 ml    Palpable radial brachial pulses bilateral upper extremities Right IJ tunneled dialysis catheter  Laboratory Lab Results: Recent Labs    09/29/20 1634 10/01/20 0246  WBC 6.6 7.8  HGB 8.4* 7.8*  HCT 26.2* 24.7*  PLT 218 188   BMET Recent Labs    09/30/20 0426 10/01/20 0246  NA 137 132*  K 3.7 3.7  CL 103 98  CO2 28 25  GLUCOSE 99 158*  BUN 14 6  CREATININE 3.75* 2.62*  CALCIUM 8.8* 8.7*    COAG Lab Results  Component Value Date   INR 0.9 06/09/2020   No results found for: PTT  Assessment/Planning:  55 year old female with progression of her stage V CKD that vascular surgery was consulted for permanent dialysis access.  Given history of stroke she is now left-hand-dominant.  We discussed plans for right arm access and she has very marginal surface veins on vein mapping and high likelihood for AV graft.    She is now agreeable to proceed.  I will post for right arm AV fistula versus graft tomorrow with one of my partners and please keep n.p.o. after midnight.  Marty Heck 10/01/2020 9:14 AM --

## 2020-10-02 ENCOUNTER — Inpatient Hospital Stay (HOSPITAL_COMMUNITY): Payer: Medicare Other | Admitting: Certified Registered"

## 2020-10-02 ENCOUNTER — Encounter (HOSPITAL_COMMUNITY): Payer: Self-pay | Admitting: Internal Medicine

## 2020-10-02 ENCOUNTER — Encounter (HOSPITAL_COMMUNITY)
Admission: EM | Disposition: A | Discharge status: Rehabilitation Facility | Payer: Self-pay | Source: Home / Self Care | Attending: Internal Medicine

## 2020-10-02 DIAGNOSIS — E8779 Other fluid overload: Secondary | ICD-10-CM | POA: Diagnosis not present

## 2020-10-02 DIAGNOSIS — N185 Chronic kidney disease, stage 5: Secondary | ICD-10-CM | POA: Diagnosis not present

## 2020-10-02 HISTORY — PX: AV FISTULA PLACEMENT: SHX1204

## 2020-10-02 LAB — RENAL FUNCTION PANEL
Albumin: 2.9 g/dL — ABNORMAL LOW (ref 3.5–5.0)
Anion gap: 8 (ref 5–15)
BUN: <5 mg/dL — ABNORMAL LOW (ref 6–20)
CO2: 26 mmol/L (ref 22–32)
Calcium: 8.8 mg/dL — ABNORMAL LOW (ref 8.9–10.3)
Chloride: 98 mmol/L (ref 98–111)
Creatinine, Ser: 2.58 mg/dL — ABNORMAL HIGH (ref 0.44–1.00)
GFR, Estimated: 21 mL/min — ABNORMAL LOW (ref >60)
Glucose, Bld: 95 mg/dL (ref 70–99)
Phosphorus: 2.2 mg/dL — ABNORMAL LOW (ref 2.5–4.6)
Potassium: 3.4 mmol/L — ABNORMAL LOW (ref 3.5–5.1)
Sodium: 132 mmol/L — ABNORMAL LOW (ref 135–145)

## 2020-10-02 LAB — GLUCOSE, CAPILLARY
Glucose-Capillary: 136 mg/dL — ABNORMAL HIGH (ref 70–99)
Glucose-Capillary: 149 mg/dL — ABNORMAL HIGH (ref 70–99)
Glucose-Capillary: 166 mg/dL — ABNORMAL HIGH (ref 70–99)
Glucose-Capillary: 139 mg/dL — ABNORMAL HIGH (ref 70–99)
Glucose-Capillary: 176 mg/dL — ABNORMAL HIGH (ref 70–99)

## 2020-10-02 LAB — CBC
HCT: 25.3 % — ABNORMAL LOW (ref 36.0–46.0)
Hemoglobin: 8.1 g/dL — ABNORMAL LOW (ref 12.0–15.0)
MCH: 27.8 pg (ref 26.0–34.0)
MCHC: 32 g/dL (ref 30.0–36.0)
MCV: 86.9 fL (ref 80.0–100.0)
Platelets: 192 10*3/uL (ref 150–400)
RBC: 2.91 MIL/uL — ABNORMAL LOW (ref 3.87–5.11)
RDW: 13.9 % (ref 11.5–15.5)
WBC: 8.6 10*3/uL (ref 4.0–10.5)
nRBC: 0.9 % — ABNORMAL HIGH (ref 0.0–0.2)

## 2020-10-02 SURGERY — ARTERIOVENOUS (AV) FISTULA CREATION
Anesthesia: Monitor Anesthesia Care | Site: Arm Upper | Laterality: Right

## 2020-10-02 MED ORDER — FENTANYL CITRATE (PF) 250 MCG/5ML IJ SOLN
INTRAMUSCULAR | Status: AC
  Filled 2020-10-02: qty 5

## 2020-10-02 MED ORDER — CARVEDILOL 6.25 MG PO TABS
6.2500 mg | ORAL_TABLET | Freq: Two times a day (BID) | ORAL | Status: DC
  Administered 2020-10-02 – 2020-10-14 (×22): 6.25 mg via ORAL
  Filled 2020-10-02 (×14): qty 1
  Filled 2020-10-02: qty 2
  Filled 2020-10-02 (×9): qty 1

## 2020-10-02 MED ORDER — ONDANSETRON HCL 4 MG/2ML IJ SOLN
INTRAMUSCULAR | Status: DC | PRN
  Administered 2020-10-02: 4 mg via INTRAVENOUS

## 2020-10-02 MED ORDER — PROPOFOL 500 MG/50ML IV EMUL
INTRAVENOUS | Status: DC | PRN
  Administered 2020-10-02: 75 ug/kg/min via INTRAVENOUS

## 2020-10-02 MED ORDER — CHLORHEXIDINE GLUCONATE 0.12 % MT SOLN: 15.0000 mL | Freq: Once | OROMUCOSAL | Status: DC

## 2020-10-02 MED ORDER — LIDOCAINE-EPINEPHRINE 1 %-1:100000 IJ SOLN
INTRAMUSCULAR | Status: DC | PRN
  Administered 2020-10-02: 10 mL

## 2020-10-02 MED ORDER — CEFAZOLIN SODIUM-DEXTROSE 2-3 GM-%(50ML) IV SOLR
INTRAVENOUS | Status: DC | PRN
  Administered 2020-10-02: 2 g via INTRAVENOUS

## 2020-10-02 MED ORDER — FENTANYL CITRATE (PF) 100 MCG/2ML IJ SOLN: 25.0000 ug | INTRAMUSCULAR | Status: DC | PRN

## 2020-10-02 MED ORDER — SODIUM CHLORIDE 0.9 % IV SOLN: INTRAVENOUS | Status: DC

## 2020-10-02 MED ORDER — MIDAZOLAM HCL 2 MG/2ML IJ SOLN
INTRAMUSCULAR | Status: AC
  Filled 2020-10-02: qty 2

## 2020-10-02 MED ORDER — CHLORHEXIDINE GLUCONATE 0.12 % MT SOLN
OROMUCOSAL | Status: AC
  Administered 2020-10-02: 15 mL
  Filled 2020-10-02: qty 15

## 2020-10-02 MED ORDER — CEFAZOLIN SODIUM-DEXTROSE 2-4 GM/100ML-% IV SOLN
INTRAVENOUS | Status: AC
  Filled 2020-10-02: qty 100

## 2020-10-02 MED ORDER — PROPOFOL 10 MG/ML IV BOLUS
INTRAVENOUS | Status: DC | PRN
  Administered 2020-10-02: 20 mg via INTRAVENOUS
  Administered 2020-10-02: 10 mg via INTRAVENOUS

## 2020-10-02 MED ORDER — MIDAZOLAM HCL 5 MG/5ML IJ SOLN
INTRAMUSCULAR | Status: DC | PRN
  Administered 2020-10-02: 2 mg via INTRAVENOUS

## 2020-10-02 MED ORDER — ORAL CARE MOUTH RINSE: 15.0000 mL | Freq: Once | OROMUCOSAL | Status: DC

## 2020-10-02 MED ORDER — SODIUM CHLORIDE 0.9 % IV SOLN
INTRAVENOUS | Status: DC | PRN
  Administered 2020-10-02: 500 mL

## 2020-10-02 MED ORDER — HYDROMORPHONE HCL 1 MG/ML IJ SOLN
1.0000 mg | Freq: Once | INTRAMUSCULAR | Status: AC
  Administered 2020-10-02: 1 mg via INTRAVENOUS
  Filled 2020-10-02: qty 1

## 2020-10-02 MED ORDER — PROPOFOL 1000 MG/100ML IV EMUL
INTRAVENOUS | Status: AC
  Filled 2020-10-02: qty 100

## 2020-10-02 MED ORDER — 0.9 % SODIUM CHLORIDE (POUR BTL) OPTIME
TOPICAL | Status: DC | PRN
  Administered 2020-10-02: 1000 mL

## 2020-10-02 MED ORDER — FENTANYL CITRATE (PF) 100 MCG/2ML IJ SOLN
INTRAMUSCULAR | Status: DC | PRN
  Administered 2020-10-02: 50 ug via INTRAVENOUS

## 2020-10-02 SURGICAL SUPPLY — 35 items
ADH SKN CLS APL DERMABOND .7 (GAUZE/BANDAGES/DRESSINGS) ×1
ARMBAND PINK RESTRICT EXTREMIT (MISCELLANEOUS) ×2 IMPLANT
CANISTER SUCT 3000ML PPV (MISCELLANEOUS) ×2 IMPLANT
CLIP LIGATING EXTRA MED SLVR (CLIP) ×2 IMPLANT
CLIP LIGATING EXTRA SM BLUE (MISCELLANEOUS) ×2 IMPLANT
CLIP VESOCCLUDE MED 6/CT (CLIP) ×2 IMPLANT
CLIP VESOCCLUDE SM WIDE 6/CT (CLIP) ×2 IMPLANT
COVER PROBE W GEL 5X96 (DRAPES) ×1 IMPLANT
COVER WAND RF STERILE (DRAPES) ×2 IMPLANT
DECANTER SPIKE VIAL GLASS SM (MISCELLANEOUS) ×1 IMPLANT
DERMABOND ADVANCED (GAUZE/BANDAGES/DRESSINGS) ×1
DERMABOND ADVANCED .7 DNX12 (GAUZE/BANDAGES/DRESSINGS) ×1 IMPLANT
ELECT REM PT RETURN 9FT ADLT (ELECTROSURGICAL) ×2
ELECTRODE REM PT RTRN 9FT ADLT (ELECTROSURGICAL) ×1 IMPLANT
GLOVE BIO SURGEON STRL SZ7.5 (GLOVE) ×2 IMPLANT
GLOVE SS BIOGEL STRL SZ 6.5 (GLOVE) IMPLANT
GLOVE SUPERSENSE BIOGEL SZ 6.5 (GLOVE) ×1
GLOVE SURG MICRO LTX SZ6.5 (GLOVE) ×1 IMPLANT
GOWN STRL REUS W/ TWL LRG LVL3 (GOWN DISPOSABLE) ×2 IMPLANT
GOWN STRL REUS W/ TWL XL LVL3 (GOWN DISPOSABLE) ×1 IMPLANT
GOWN STRL REUS W/TWL LRG LVL3 (GOWN DISPOSABLE) ×4
GOWN STRL REUS W/TWL XL LVL3 (GOWN DISPOSABLE) ×4
INSERT FOGARTY SM (MISCELLANEOUS) IMPLANT
KIT BASIN OR (CUSTOM PROCEDURE TRAY) ×2 IMPLANT
KIT TURNOVER KIT B (KITS) ×2 IMPLANT
NS IRRIG 1000ML POUR BTL (IV SOLUTION) ×2 IMPLANT
PACK CV ACCESS (CUSTOM PROCEDURE TRAY) ×2 IMPLANT
PAD ARMBOARD 7.5X6 YLW CONV (MISCELLANEOUS) ×4 IMPLANT
SUT MNCRL AB 4-0 PS2 18 (SUTURE) ×2 IMPLANT
SUT PROLENE 6 0 BV (SUTURE) ×2 IMPLANT
SUT VIC AB 3-0 SH 27 (SUTURE) ×2
SUT VIC AB 3-0 SH 27X BRD (SUTURE) ×1 IMPLANT
TOWEL GREEN STERILE (TOWEL DISPOSABLE) ×2 IMPLANT
UNDERPAD 30X36 HEAVY ABSORB (UNDERPADS AND DIAPERS) ×2 IMPLANT
WATER STERILE IRR 1000ML POUR (IV SOLUTION) ×2 IMPLANT

## 2020-10-02 NOTE — Anesthesia Preprocedure Evaluation (Signed)
Anesthesia Evaluation  Patient identified by MRN, date of birth, ID band Patient awake    Reviewed: Allergy & Precautions, NPO status , Patient's Chart, lab work & pertinent test results  Airway Mallampati: II  TM Distance: >3 FB Neck ROM: Full    Dental   Pulmonary neg pulmonary ROS,    breath sounds clear to auscultation       Cardiovascular hypertension, Pt. on medications  Rhythm:Regular Rate:Normal     Neuro/Psych CVA    GI/Hepatic negative GI ROS, Neg liver ROS,   Endo/Other  diabetes, Type 2  Renal/GU Renal disease     Musculoskeletal   Abdominal   Peds  Hematology  (+) anemia ,   Anesthesia Other Findings   Reproductive/Obstetrics                             Anesthesia Physical Anesthesia Plan  ASA: III  Anesthesia Plan: MAC   Post-op Pain Management:    Induction:   PONV Risk Score and Plan: 2 and Propofol infusion, Ondansetron and Treatment may vary due to age or medical condition  Airway Management Planned: Natural Airway and Simple Face Mask  Additional Equipment:   Intra-op Plan:   Post-operative Plan:   Informed Consent: I have reviewed the patients History and Physical, chart, labs and discussed the procedure including the risks, benefits and alternatives for the proposed anesthesia with the patient or authorized representative who has indicated his/her understanding and acceptance.     Dental advisory given  Plan Discussed with: CRNA  Anesthesia Plan Comments:        Anesthesia Quick Evaluation

## 2020-10-02 NOTE — Anesthesia Postprocedure Evaluation (Signed)
Anesthesia Post Note  Patient: Hannah [name]  Procedure(s) Performed: RIGHT ARTERIOVENOUS (AV) FISTULA CREATION (Right Arm Upper)     Patient location during evaluation: PACU Anesthesia Type: MAC Level of consciousness: awake and alert Pain management: pain level controlled Vital Signs Assessment: post-procedure vital signs reviewed and stable Respiratory status: spontaneous breathing, nonlabored ventilation, respiratory function stable and patient connected to nasal cannula oxygen Cardiovascular status: stable and blood pressure returned to baseline Postop Assessment: no apparent nausea or vomiting Anesthetic complications: no   No complications documented.  Last Vitals:  Vitals:   10/02/20 1240 10/02/20 1319  BP: (!) 154/68 (!) 167/74  Pulse: (!) 105 (!) 107  Resp: (!) 24 18  Temp: (!) 36.2 C 37.3 C  SpO2: 97% 98%    Last Pain:  Vitals:   10/02/20 1240  TempSrc:   PainSc: 0-No pain                 Tiajuana Amass

## 2020-10-02 NOTE — Progress Notes (Addendum)
Renal Navigator completed and submitted [facility] [place] application for transportation. Renal Navigator will follow up with Eligibility Coordinator regarding pre-certification to ride. Patient's daughters can transport until this is arranged.   Alphonzo Cruise, [place] Renal Navigator [phone]

## 2020-10-02 NOTE — Plan of Care (Signed)

## 2020-10-02 NOTE — Op Note (Addendum)
    Patient name: Hannah [name] MRN: [id number] DOB: [birth year] Sex: female  10/02/2020 Pre-operative Diagnosis: End-stage renal disease Post-operative diagnosis:  Same Surgeon:  [name] C. Donzetta Matters, MD Assistant: Risa Grill, PA Procedure Performed:  Creation of right brachial artery to basilic vein AV fistula  Indications: 55 year old female now on dialysis via right IJ catheter.  She is indicated for permanent access.  She is left-hand dominant after having stroke on the right side.  An assistant was necessary to expedite the case.  Findings: Cephalic vein on the right upper extremity was very small.  The basilic vein appears suitable by ultrasound for access creation.  Grossly this was dilated up to 3 mm.  After fistula creation there was very strong thrill in the fistula she has a 3+ palpable right radial pulse.   Procedure:  The patient was identified in the holding area and taken to the operating where she is placed supine on upper table and MAC anesthesia was induced.  She was sterilely prepped draped in the right upper extremity usual fashion, antibiotics were minister timeout was called.  Ultrasound was used we identified a suitable basilic vein.  Her upper arm above the antecubitum was anesthetized 1% lidocaine with epinephrine.  Transverse incision was made between the brachial artery pulse and the vein marked on the skin.  We dissected down to the vein.  Branches were divided between clips and ties.  The vein was marked for orientation.  We dissected to the deep fascia.  We divided one of the brachial veins.  The nerve was protected.  Vessel loop was placed around the artery.  The vein was then doubly clipped distally and transected.  We spatulated serially dilated to 3 mm flushed heparinized saline and clamped.  The arteries clamped distally proximally opened longitudinally flushed with heparinized saline distally only.  The vein was then sewn end-to-side with 6-0 Prolene suture.  Prior  completion of flushing all directions.  Upon completion the vein was much larger had very strong thrill there was a strong palpable radial artery pulse both these were confirmed with Doppler.  We irrigated the wound obtain hemostasis and closed in layers of Vicryl and Monocryl.  She was awakened from anesthesia having tolerated procedure well without any complication.  Counts were correct at completion.  EBL: 20 cc     Dianely Krehbiel C. Donzetta Matters, MD [facility] Office: [phone] Pager: [phone]

## 2020-10-02 NOTE — Progress Notes (Addendum)
Subjective: Patient continues to not feel very well with pain in her legs.  She also states she feels short of breath but has a hard time speaking to specifics.  Objective Vital signs in last 24 hours: Vitals:   10/02/20 0609 10/02/20 0850 10/02/20 1011 10/02/20 1042  BP: (!) 183/81 (!) 174/88 (!) 216/85 (!) 182/66  Pulse: 100 (!) 101 (!) 119   Resp:  18 20 (!) 21  Temp: 97.8 F (36.6 C) 98.7 F (37.1 C)    TempSrc: Oral Oral    SpO2:  100% 97%   Weight:      Height:       Weight change: 1 kg  Intake/Output Summary (Last 24 hours) at 10/02/2020 1105 Last data filed at 10/01/2020 1808 Gross per 24 hour  Intake --  Output 1500 ml  Net -1500 ml    Assessment/Plan: [name] with DM and HTN with progressive CKD and now what appears to be uremic symptoms.  My recommendation would be to initiate dialysis this admission 1.Renal- advanced CKD that has been longstanding now felt to be ESRD-  Has now progressed to the point where she is uremic and deemed to be ESRD needing to start dialysis.   CLIP process underway.  Getting AVF vs AVG today.  Plan for dialysis tomorrow per MWF schedule 2. Hypertension/volume  - BP high and trying to remove UF with HD but patient symptoms limiting. Starting coreg 6.'25mg'$  BID today. 3. Anemia  - is quite anemic-   iron stores low, s/p repletion and now on ESA 140mg weekly. 4. Bones-  PTH 84. No treatment needed 5. Acidosis- Resolved    [name]   Labs: Basic Metabolic Panel: Recent Labs  Lab 09/30/20 0426 10/01/20 0246 10/02/20 0429  NA 137 132* 132*  K 3.7 3.7 3.4*  CL 103 98 98  CO2 '28 25 26  '$ GLUCOSE 99 158* 95  BUN 14 6 <5*  CREATININE 3.75* 2.62* 2.58*  CALCIUM 8.8* 8.7* 8.8*  PHOS 3.7 2.7 2.2*   Liver Function Tests: Recent Labs  Lab 09/30/20 0426 10/01/20 0246 10/02/20 0429  ALBUMIN 2.7* 2.6* 2.9*   No results for input(s): LIPASE, AMYLASE in the last 168 hours. No results for input(s): AMMONIA in the last 168  hours. CBC: Recent Labs  Lab 09/26/20 2127 09/27/20 0330 09/29/20 1634 10/01/20 0246 10/02/20 0429  WBC 7.0 6.3 6.6 7.8 8.6  NEUTROABS 4.6  --   --   --   --   HGB 8.2* 8.0* 8.4* 7.8* 8.1*  HCT 25.5* 24.9* 26.2* 24.7* 25.3*  MCV 87.0 86.8 87.6 88.2 86.9  PLT 222 213 218 188 192   Cardiac Enzymes: No results for input(s): CKTOTAL, CKMB, CKMBINDEX, TROPONINI in the last 168 hours. CBG: Recent Labs  Lab 10/01/20 1123 10/01/20 1844 10/01/20 2019 10/02/20 0644 10/02/20 0945  GLUCAP 228* 94 138* 136* 149*    Iron Studies:  No results for input(s): IRON, TIBC, TRANSFERRIN, FERRITIN in the last 72 hours. Studies/Results: No results found. Medications: Infusions: . [MAR Hold] sodium chloride Stopped (09/28/20 1354)  . sodium chloride 10 mL/hr at 10/02/20 1052  . ceFAZolin      Scheduled Medications: . [MAR Hold] amLODipine  10 mg Oral Daily  . [MAR Hold] carvedilol  6.25 mg Oral BID WC  . chlorhexidine  15 mL Mouth/Throat Once   Or  . mouth rinse  15 mL Mouth Rinse Once  . [MAR Hold] Chlorhexidine Gluconate Cloth  6 each Topical Q0600  . [MAR Hold] darbepoetin (ARANESP) injection - DIALYSIS  150 mcg Intravenous Q Mon-HD  . [MAR Hold] famotidine  20 mg Oral Daily  . [MAR Hold] FLUoxetine  20 mg Oral Daily  . [MAR Hold] heparin  5,000 Units Subcutaneous Q8H  . [MAR Hold] hydrALAZINE  50 mg Oral Q8H  . [MAR Hold] insulin aspart  0-6 Units Subcutaneous TID WC  . [MAR Hold] insulin glargine  10 Units Subcutaneous QHS  . [MAR Hold] melatonin  3 mg Oral QHS  . [MAR Hold] metoCLOPramide  5 mg Oral BID  . [MAR Hold] rosuvastatin  10 mg Oral Daily  . [MAR Hold] sodium chloride flush  3 mL Intravenous Q12H  . [MAR Hold] tiZANidine  2 mg Oral Q8H    have reviewed scheduled and prn medications.  Physical Exam: General:  NAD-  Lying in bed Heart: RRR Lungs: No increased work of breathing, bilateral chest rise, mild coarse upper airway sounds but otherwise clear  peripherally Abdomen: soft, non tender Extremities: Warm and well perfused    10/02/2020,11:05 AM  LOS: 5 days

## 2020-10-02 NOTE — Progress Notes (Signed)
Renal Navigator left message for patient's daughter and received a call back immediately to follow up on our conversation from yesterday.  She states she has spoken with her siblings (she is 27, and has a 57 year old brother, 45 year old sister and 61 year old sister) who all feel that patient should start at TCU for the most support and education for a new HD patient. Navigator agrees this would be great for patient regardless of whether she chooses home therapy or in-center at the end of the program. Patient's daughter is very Patent attorney. Her only question at this time is how long treatments are in [place]. Navigator informed her that they are 3 hours, 4x per week.  Navigator will assist patient in completing [facility] [place] application. Referral submitted to [facility] to request seat at [facility]. Navigator will follow closely.  Alphonzo Cruise, [place] Renal Navigator [phone]

## 2020-10-02 NOTE — Anesthesia Procedure Notes (Signed)
Procedure Name: MAC Date/Time: 10/02/2020 11:00 AM Performed by: Moshe Salisbury, CRNA Pre-anesthesia Checklist: Patient identified, Emergency Drugs available, Suction available and Patient being monitored Patient Re-evaluated:Patient Re-evaluated prior to induction Oxygen Delivery Method: Nasal cannula Placement Confirmation: positive ETCO2 Dental Injury: Teeth and Oropharynx as per pre-operative assessment

## 2020-10-02 NOTE — Progress Notes (Signed)
PROGRESS NOTE    Hannah [name]  [id number] DOB: [birth year] DOA: 09/26/2020 PCP: Vernie Shanks, MD   Chief Complaint  Patient presents with  . Leg Swelling  Brief Narrative: 55 year old female with CKD 5, anemia, CVA, hypertension, diabetes, hyperlipidemia, multiple sclerosis presents with shortness of breath, lower extremity swelling, onset 2 to 3 days prior to admission.  Patient was seen by PCP and sent to the ED for evaluation.  Recently admitted 2 weeks ago for AKI on CKD 4-renal ultrasound was unremarkable believed to be progression of her CKD, her blood pressure medication were adjusted and was planning for nephrology outpatient follow-up. She was seen in the ED creatinine 5.3 similar to previous with anemia elevated BNP DVT study was negative chest x-ray showed ill-defined opacity consistent with pneumonia in the right middle and lower lobes and some atelectasis.  Nephrology was consulted with her volume overload likely approaching ESRD, patient was admitted for further management. Seen by nephrology started on hemodialysis.  Subjective:  Overnight blood pressure running high in 180s, afebrile BUN stable potassium 3.4.  Not feeling well Complains of weakness Assessment & Plan:  Volume overload Longstanding CKD that has progressed to ESRD Uremia/metabolic acidosis: Now on dialysis, managed by nephrology. CLIP in process.Vascular surgery following for  permanent access-aVF versus AVG today.Continue to monitor electrolytes and fluid status and cont HD per nephro planning for MWF schedule.no vomiting today, on reglan 5 mg bid, added prn Reglan 5/18- cont prn Zofran.Acidosis has resolved. PTH 84 no treatment needed.  Essential hypertension, borderline controlled continue amlodipine carvedilol hydralazine.  Allow room for HD, if remains poorly controlled can increase hydralazine further.  Monitor.  Type 2 diabetes mellitus hemoglobin A1c 8.7 most recently.  Blood sugar remains  controlled on Lantus 10 unit nightly, sliding scale insulin and continue to hold home Tresiba lispro metformin and glipizide. Recent Labs  Lab 10/01/20 0802 10/01/20 1123 10/01/20 1844 10/01/20 2019 10/02/20 0644  GLUCAP 149* 228* 94 138* 136*   History of CVA/Hyperlipidemia: On Crestor and Plavix  Anemia of chronic disease/renal disease ,maanged by ferric gluconate,ESA by nephrology monitor H&H Recent Labs  Lab 09/26/20 2127 09/27/20 0330 09/29/20 1634 10/01/20 0246 10/02/20 0429  HGB 8.2* 8.0* 8.4* 7.8* 8.1*  HCT 25.5* 24.9* 26.2* 24.7* 25.3*   Multiple sclerosis: Not currently on treatment last MRI with any active lesions.  Diet Order            Diet NPO time specified  Diet effective midnight               Patient's Body mass index is 23.39 kg/m. DVT prophylaxis: heparin injection 5,000 Units Start: 09/30/20 0600 Code Status:   Code Status: Full Code  Family Communication: plan of care discussed with patient at bedside.  Status is: Inpatient Remains inpatient appropriate because:IV treatments appropriate due to intensity of illness or inability to take PO and Inpatient level of care appropriate due to severity of illness  Dispo: The patient is from: Home              Anticipated d/c is to: Home once CLIP and access done.Continue PT OT has suggested outpatient PT/supervision assistant              Patient currently is not medically stable to d/c.   Difficult to place patient No  Unresulted Labs (From admission, onward)          Start     Ordered   10/02/20 0500  CBC  Daily,  R      10/01/20 1437   09/28/20 0500  Renal function panel  Daily,   R      09/27/20 1218   Signed and Held  Hepatitis B surface antigen  (New Patient Hemo Labs (Hepatitis B))  Once,   R        Signed and Held   Signed and Held  Hepatitis B surface antibody  (New Patient Hemo Labs (Hepatitis B))  Once,   R        Signed and Held   Signed and Held  Hepatitis B core antibody, total   (New Patient Hemo Labs (Hepatitis B))  Once,   R        Signed and Held   Signed and Held  Hepatitis C antibody  (New Patient Hemo Labs (Hepatitis B))  Once,   R        Signed and Held         Medications reviewed:  Scheduled Meds: . amLODipine  10 mg Oral Daily  . carvedilol  6.25 mg Oral BID WC  . Chlorhexidine Gluconate Cloth  6 each Topical Q0600  . darbepoetin (ARANESP) injection - DIALYSIS  150 mcg Intravenous Q Mon-HD  . famotidine  20 mg Oral Daily  . FLUoxetine  20 mg Oral Daily  . heparin  5,000 Units Subcutaneous Q8H  . hydrALAZINE  50 mg Oral Q8H  . insulin aspart  0-6 Units Subcutaneous TID WC  . insulin glargine  10 Units Subcutaneous QHS  . melatonin  3 mg Oral QHS  . metoCLOPramide  5 mg Oral BID  . rosuvastatin  10 mg Oral Daily  . sodium chloride flush  3 mL Intravenous Q12H  . tiZANidine  2 mg Oral Q8H   Continuous Infusions: . sodium chloride Stopped (09/28/20 1354)    Consultants:see note  Procedures:see note  Antimicrobials: Anti-infectives (From admission, onward)   Start     Dose/Rate Route Frequency Ordered Stop   09/29/20 1330  ceFAZolin (ANCEF) 2-4 GM/100ML-% IVPB       Note to Pharmacy: Cecile Sheerer   : cabinet override      09/29/20 1330 09/30/20 0144   09/29/20 0000  ceFAZolin (ANCEF) 1 g in sodium chloride 0.9 % 100 mL IVPB        1 g 200 mL/hr over 30 Minutes Intravenous To Radiology 09/27/20 1457 09/29/20 1419     Culture/Microbiology No results found for: SDES, SPECREQUEST, CULT, REPTSTATUS  Other culture-see note  Objective: Vitals: Today's Vitals   10/01/20 1954 10/01/20 2050 10/01/20 2107 10/02/20 0609  BP:   (!) 180/80 (!) 183/81  Pulse:   (!) 105 100  Resp:   18   Temp:   98.7 F (37.1 C) 97.8 F (36.6 C)  TempSrc:   Oral Oral  SpO2:   98%   Weight:      Height:      PainSc: 8  3       Intake/Output Summary (Last 24 hours) at 10/02/2020 0805 Last data filed at 10/01/2020 1808 Gross per 24 hour  Intake --   Output 1500 ml  Net -1500 ml   Filed Weights   10/01/20 0500 10/01/20 1455 10/01/20 1830  Weight: 62.1 kg 59.5 kg 58 kg   Weight change: 1 kg  Intake/Output from previous day: 05/18 0701 - 05/19 0700 In: -  Out: 1500  Intake/Output this shift: No intake/output data recorded. Filed Weights   10/01/20 0500 10/01/20 1455 10/01/20 1830  Weight: 62.1 kg 59.5 kg 58 kg    Examination: General exam: AAO, looks older than his stated age, weak, obese. HEENT:Oral mucosa moist, Ear/Nose WNL grossly, dentition normal. Respiratory system: bilaterally clear without wheezing, no use of accessory muscle Cardiovascular system: S1 & S2 +, No JVD,. Gastrointestinal system: Abdomen soft, NT,ND, BS+ Nervous System:Alert, awake, moving extremities and grossly nonfocal Extremities: MILD edema, distal peripheral pulses palpable.  Skin: No rashes,no icterus. MSK: Normal muscle bulk,tone, power  Data Reviewed: I have personally reviewed following labs and imaging studies CBC: Recent Labs  Lab 09/26/20 2127 09/27/20 0330 09/29/20 1634 10/01/20 0246 10/02/20 0429  WBC 7.0 6.3 6.6 7.8 8.6  NEUTROABS 4.6  --   --   --   --   HGB 8.2* 8.0* 8.4* 7.8* 8.1*  HCT 25.5* 24.9* 26.2* 24.7* 25.3*  MCV 87.0 86.8 87.6 88.2 86.9  PLT 222 213 218 188 [id number]   Basic Metabolic Panel: Recent Labs  Lab 09/26/20 1731 09/27/20 0330 09/28/20 0239 09/29/20 0037 09/30/20 0426 10/01/20 0246 10/02/20 0429  NA 137   < > 139 136 137 132* 132*  K 4.1   < > 3.7 3.7 3.7 3.7 3.4*  CL 106   < > 103 101 103 98 98  CO2 23   < > '26 27 28 25 26  '$ GLUCOSE 175*   < > 167* 225* 99 158* 95  BUN 37*   < > 32* 32* 14 6 <5*  CREATININE 5.34*   < > 5.45* 5.78* 3.75* 2.62* 2.58*  CALCIUM 8.8*   < > 8.9 8.5* 8.8* 8.7* 8.8*  MG 2.1  --   --   --   --   --   --   PHOS 4.2   < > 4.9* 4.9* 3.7 2.7 2.2*   < > = values in this interval not displayed.   GFR: Estimated Creatinine Clearance: 19.3 mL/min (A) (by C-G formula based on  SCr of 2.58 mg/dL (H)). Liver Function Tests: Recent Labs  Lab 09/28/20 0239 09/29/20 0037 09/30/20 0426 10/01/20 0246 10/02/20 0429  ALBUMIN 2.5* 2.5* 2.7* 2.6* 2.9*   No results for input(s): LIPASE, AMYLASE in the last 168 hours. No results for input(s): AMMONIA in the last 168 hours. Coagulation Profile: No results for input(s): INR, PROTIME in the last 168 hours. Cardiac Enzymes: No results for input(s): CKTOTAL, CKMB, CKMBINDEX, TROPONINI in the last 168 hours. BNP (last 3 results) No results for input(s): PROBNP in the last 8760 hours. HbA1C: No results for input(s): HGBA1C in the last 72 hours. CBG: Recent Labs  Lab 10/01/20 0802 10/01/20 1123 10/01/20 1844 10/01/20 2019 10/02/20 0644  GLUCAP 149* 228* 94 138* 136*   Lipid Profile: No results for input(s): CHOL, HDL, LDLCALC, TRIG, CHOLHDL, LDLDIRECT in the last 72 hours. Thyroid Function Tests: No results for input(s): TSH, T4TOTAL, FREET4, T3FREE, THYROIDAB in the last 72 hours. Anemia Panel: No results for input(s): VITAMINB12, FOLATE, FERRITIN, TIBC, IRON, RETICCTPCT in the last 72 hours. Sepsis Labs: No results for input(s): PROCALCITON, LATICACIDVEN in the last 168 hours.  Recent Results (from the past 240 hour(s))  Resp Panel by RT-PCR (Flu A&B, Covid) Nasopharyngeal Swab     Status: None   Collection Time: 09/26/20  9:27 PM   Specimen: Nasopharyngeal Swab; Nasopharyngeal(NP) swabs in vial transport medium  Result Value Ref Range Status   SARS Coronavirus 2 by RT PCR NEGATIVE NEGATIVE Final    Comment: (NOTE) SARS-CoV-2 target nucleic acids are NOT DETECTED.  The SARS-CoV-2 RNA is generally detectable in upper respiratory specimens during the acute phase of infection. The lowest concentration of SARS-CoV-2 viral copies this assay can detect is 138 copies/mL. A negative result does not preclude SARS-Cov-2 infection and should not be used as the sole basis for treatment or other patient management  decisions. A negative result may occur with  improper specimen collection/handling, submission of specimen other than nasopharyngeal swab, presence of viral mutation(s) within the areas targeted by this assay, and inadequate number of viral copies(<138 copies/mL). A negative result must be combined with clinical observations, patient history, and epidemiological information. The expected result is Negative.  Fact Sheet for Patients:  EntrepreneurPulse.com.au  Fact Sheet for Healthcare Providers:  IncredibleEmployment.be  This test is no t yet approved or cleared by the [place] FDA and  has been authorized for detection and/or diagnosis of SARS-CoV-2 by FDA under an Emergency Use Authorization (EUA). This EUA will remain  in effect (meaning this test can be used) for the duration of the COVID-19 declaration under Section 564(b)(1) of the Act, 21 U.S.C.section 360bbb-3(b)(1), unless the authorization is terminated  or revoked sooner.       Influenza A by PCR NEGATIVE NEGATIVE Final   Influenza B by PCR NEGATIVE NEGATIVE Final    Comment: (NOTE) The Xpert Xpress SARS-CoV-2/FLU/RSV plus assay is intended as an aid in the diagnosis of influenza from Nasopharyngeal swab specimens and should not be used as a sole basis for treatment. Nasal washings and aspirates are unacceptable for Xpert Xpress SARS-CoV-2/FLU/RSV testing.  Fact Sheet for Patients: EntrepreneurPulse.com.au  Fact Sheet for Healthcare Providers: IncredibleEmployment.be  This test is not yet approved or cleared by the [place] FDA and has been authorized for detection and/or diagnosis of SARS-CoV-2 by FDA under an Emergency Use Authorization (EUA). This EUA will remain in effect (meaning this test can be used) for the duration of the COVID-19 declaration under Section 564(b)(1) of the Act, 21 U.S.C. section 360bbb-3(b)(1), unless the  authorization is terminated or revoked.  Performed at [facility], [place] [address] [place], [place] [address]      Radiology Studies: No results found.   LOS: 5 days   Antonieta Pert, MD [facility]  10/02/2020, 8:05 AM

## 2020-10-02 NOTE — Interval H&P Note (Signed)
History and Physical Interval Note:  10/02/2020 10:15 AM  Hannah [name]  has presented today for surgery, with the diagnosis of ESRD.  The various methods of treatment have been discussed with the patient and family. After consideration of risks, benefits and other options for treatment, the patient has consented to  Procedure(s): RIGHT ARTERIOVENOUS (AV) FISTULA CREATION VERSUS GRAFT (Right) as a surgical intervention.  The patient's history has been reviewed, patient examined, no change in status, stable for surgery.  I have reviewed the patient's chart and labs.  Questions were answered to the patient's satisfaction.     Servando Snare

## 2020-10-02 NOTE — Transfer of Care (Signed)
Immediate Anesthesia Transfer of Care Note  Patient: Hannah [name]  Procedure(s) Performed: RIGHT ARTERIOVENOUS (AV) FISTULA CREATION (Right Arm Upper)  Patient Location: PACU  Anesthesia Type:MAC  Level of Consciousness: drowsy and patient cooperative  Airway & Oxygen Therapy: Patient Spontanous Breathing and Patient connected to nasal cannula oxygen  Post-op Assessment: Report given to RN, Post -op Vital signs reviewed and stable and Patient moving all extremities  Post vital signs: Reviewed and stable  Last Vitals:  Vitals Value Taken Time  BP 162/81 10/02/20 1208  Temp    Pulse 99 10/02/20 1208  Resp 20 10/02/20 1208  SpO2 99 % 10/02/20 1208  Vitals shown include unvalidated device data.  Last Pain:  Vitals:   10/02/20 1008  TempSrc:   PainSc: 10-Worst pain ever      Patients Stated Pain Goal: 4 (48/88/91 6945)  Complications: No complications documented.

## 2020-10-03 ENCOUNTER — Encounter (HOSPITAL_COMMUNITY): Payer: Self-pay | Admitting: Vascular Surgery

## 2020-10-03 ENCOUNTER — Inpatient Hospital Stay (HOSPITAL_COMMUNITY): Payer: Medicare Other

## 2020-10-03 DIAGNOSIS — E8779 Other fluid overload: Secondary | ICD-10-CM | POA: Diagnosis not present

## 2020-10-03 LAB — RENAL FUNCTION PANEL
Albumin: 2.7 g/dL — ABNORMAL LOW (ref 3.5–5.0)
Anion gap: 7 (ref 5–15)
BUN: 7 mg/dL (ref 6–20)
CO2: 25 mmol/L (ref 22–32)
Calcium: 8.7 mg/dL — ABNORMAL LOW (ref 8.9–10.3)
Chloride: 96 mmol/L — ABNORMAL LOW (ref 98–111)
Creatinine, Ser: 3.9 mg/dL — ABNORMAL HIGH (ref 0.44–1.00)
GFR, Estimated: 13 mL/min — ABNORMAL LOW (ref >60)
Glucose, Bld: 164 mg/dL — ABNORMAL HIGH (ref 70–99)
Phosphorus: 3.1 mg/dL (ref 2.5–4.6)
Potassium: 3.6 mmol/L (ref 3.5–5.1)
Sodium: 128 mmol/L — ABNORMAL LOW (ref 135–145)

## 2020-10-03 LAB — GLUCOSE, CAPILLARY
Glucose-Capillary: 223 mg/dL — ABNORMAL HIGH (ref 70–99)
Glucose-Capillary: 210 mg/dL — ABNORMAL HIGH (ref 70–99)
Glucose-Capillary: 226 mg/dL — ABNORMAL HIGH (ref 70–99)
Glucose-Capillary: 100 mg/dL — ABNORMAL HIGH (ref 70–99)
Glucose-Capillary: 145 mg/dL — ABNORMAL HIGH (ref 70–99)

## 2020-10-03 LAB — CBC
HCT: 24 % — ABNORMAL LOW (ref 36.0–46.0)
Hemoglobin: 7.7 g/dL — ABNORMAL LOW (ref 12.0–15.0)
MCH: 28.6 pg (ref 26.0–34.0)
MCHC: 32.1 g/dL (ref 30.0–36.0)
MCV: 89.2 fL (ref 80.0–100.0)
Platelets: 179 10*3/uL (ref 150–400)
RBC: 2.69 MIL/uL — ABNORMAL LOW (ref 3.87–5.11)
RDW: 14.3 % (ref 11.5–15.5)
WBC: 7.5 10*3/uL (ref 4.0–10.5)
nRBC: 2 % — ABNORMAL HIGH (ref 0.0–0.2)

## 2020-10-03 MED ORDER — HEPARIN SODIUM (PORCINE) 1000 UNIT/ML IJ SOLN
INTRAMUSCULAR | Status: AC
  Filled 2020-10-03: qty 1

## 2020-10-03 MED ORDER — CLOPIDOGREL BISULFATE 75 MG PO TABS
75.0000 mg | ORAL_TABLET | Freq: Every day | ORAL | Status: DC
  Administered 2020-10-03 – 2020-10-06 (×4): 75 mg via ORAL
  Filled 2020-10-03 (×4): qty 1

## 2020-10-03 NOTE — Progress Notes (Signed)
Patient has been accepted at the [facility]) with a seat time of 9:40am. She can start on Monday, 10/06/20 if discharged prior to this date.  She needs to arrive at 8:40am on her first day to complete paperwork prior to her first appointment. All following appointments, patient should arrive at 9:20am. Treatments are Monday, Tuesday, Thursday, Friday. This has been explained to patient's daughter [name], who is very Patent attorney.  [name] can transport until [place] is arranged. I anticipate this to be Monday and I will still follow up after discharge.  Renal Navigator asked Renal PA to send outpatient orders to clinic.   Alphonzo Cruise, [place] Renal Navigator [phone]

## 2020-10-03 NOTE — Progress Notes (Signed)
Subjective: Continues to have right leg pain that is unchanged.  Planning for dialysis today.  No other complaints  Objective Vital signs in last 24 hours: Vitals:   10/02/20 1319 10/02/20 2012 10/03/20 0406 10/03/20 0837  BP: (!) 167/74 (!) 161/70 (!) 151/74 134/63  Pulse: (!) 107 100 100 90  Resp: '18 16 19 17  '$ Temp: 99.1 F (37.3 C) 98.6 F (37 C) 98 F (36.7 C) 98.3 F (36.8 C)  TempSrc:  Oral Oral Oral  SpO2: 98% 97% 100% 98%  Weight:   59.5 kg   Height:       Weight change: 0 kg  Intake/Output Summary (Last 24 hours) at 10/03/2020 1239 Last data filed at 10/03/2020 0400 Gross per 24 hour  Intake 120 ml  Output --  Net 120 ml    Assessment/Plan: [name] with DM and HTN with progressive CKD and now what appears to be uremic symptoms.  My recommendation would be to initiate dialysis this admission 1.Renal- advanced CKD that has been longstanding now felt to be ESRD-  Has now progressed to the point where she is uremic and deemed to be ESRD needing to start dialysis.   S/p AVF yesterday; appreciate help from VVS. Dialysis today; CLIP process underway possibly will be set up later today 2. Hypertension/volume  - BP high and trying to remove UF with HD but patient symptoms limiting. BP improved today 3. Anemia  -  iron stores low, s/p repletion and now on ESA 173mg weekly. 4. Bones-  PTH 84. No treatment needed 5. Acidosis- Resolved    [name]   Labs: Basic Metabolic Panel: Recent Labs  Lab 10/01/20 0246 10/02/20 0429 10/03/20 0327  NA 132* 132* 128*  K 3.7 3.4* 3.6  CL 98 98 96*  CO2 '25 26 25  '$ GLUCOSE 158* 95 164*  BUN 6 <5* 7  CREATININE 2.62* 2.58* 3.90*  CALCIUM 8.7* 8.8* 8.7*  PHOS 2.7 2.2* 3.1   Liver Function Tests: Recent Labs  Lab 10/01/20 0246 10/02/20 0429 10/03/20 0327  ALBUMIN 2.6* 2.9* 2.7*   No results for input(s): LIPASE, AMYLASE in the last 168 hours. No results for input(s): AMMONIA in the last 168  hours. CBC: Recent Labs  Lab 09/26/20 2127 09/27/20 0330 09/29/20 1634 10/01/20 0246 10/02/20 0429 10/03/20 0327  WBC 7.0 6.3 6.6 7.8 8.6 7.5  NEUTROABS 4.6  --   --   --   --   --   HGB 8.2* 8.0* 8.4* 7.8* 8.1* 7.7*  HCT 25.5* 24.9* 26.2* 24.7* 25.3* 24.0*  MCV 87.0 86.8 87.6 88.2 86.9 89.2  PLT 222 213 218 188 192 179   Cardiac Enzymes: No results for input(s): CKTOTAL, CKMB, CKMBINDEX, TROPONINI in the last 168 hours. CBG: Recent Labs  Lab 10/02/20 1209 10/02/20 1636 10/02/20 2144 10/03/20 0623 10/03/20 1114  GLUCAP 166* 139* 176* 223* 210*    Iron Studies:  No results for input(s): IRON, TIBC, TRANSFERRIN, FERRITIN in the last 72 hours. Studies/Results: No results found. Medications: Infusions: . sodium chloride Stopped (09/28/20 1354)    Scheduled Medications: . amLODipine  10 mg Oral Daily  . carvedilol  6.25 mg Oral BID WC  . Chlorhexidine Gluconate Cloth  6 each Topical Q0600  . clopidogrel  75 mg Oral Daily  . darbepoetin (ARANESP) injection - DIALYSIS  150 mcg Intravenous Q Mon-HD  . famotidine  20 mg Oral Daily  . FLUoxetine  20 mg Oral Daily  . heparin  5,000 Units Subcutaneous Q8H  . hydrALAZINE  50 mg Oral Q8H  . insulin aspart  0-6 Units Subcutaneous TID WC  . insulin glargine  10 Units Subcutaneous QHS  . melatonin  3 mg Oral QHS  . metoCLOPramide  5 mg Oral BID  . rosuvastatin  10 mg Oral Daily  . sodium chloride flush  3 mL Intravenous Q12H  . tiZANidine  2 mg Oral Q8H    have reviewed scheduled and prn medications.  Physical Exam: General:  NAD- sitting in chair Heart: RRR Lungs: No increased work of breathing, bilateral chest rise Abdomen: soft, non tender Extremities: Warm and well perfused    10/03/2020,12:39 PM  LOS: 6 days

## 2020-10-03 NOTE — Progress Notes (Addendum)
  Postoperative hemodialysis access     Date of Surgery:  10/02/2020 Surgeon: Donzetta Matters  Subjective:  Sleeping comfortably and wakes easily.  Denies pain in her right hand  PHYSICAL EXAMINATION:  Vitals:   10/02/20 2012 10/03/20 0406  BP: (!) 161/70 (!) 151/74  Pulse: 100 100  Resp: 16 19  Temp: 98.6 F (37 C) 98 F (36.7 C)  SpO2: 97% 100%    Incision is clean and dry Sensation in digits is intact;  There is  Thrill  The fistula is palpable  Easily palpable right radial pulse Motor and sensation in tact right hand  ASSESSMENT/PLAN:  Hannah [name] is a 55 y.o. year old female who is s/p left 1st stage BVT.  -fistula is patent -pt does not have evidence of steal sx -f/u with [facility]VS in 6 weeks to check maturation of AVF.  If it matures nicely, will plan for 2nd stage BVT.  Our office will arrange her appt. She knows to call sooner if needed. -will sign off-call as needed.   Leontine Locket, PA-C [facility] [phone]  I have independently interviewed and examined patient and agree with PA assessment and plan above.   Nicco Reaume C. Donzetta Matters, MD [facility] [place] Office: [phone] Pager: [phone]

## 2020-10-03 NOTE — Discharge Instructions (Signed)
   [facility]  Discharge Instructions  AV Fistula or Graft Surgery for Dialysis Access  Please refer to the following instructions for your post-procedure care. Your surgeon or physician assistant will discuss any changes with you.  Activity  You may drive the day following your surgery, if you are comfortable and no longer taking prescription pain medication. Resume full activity as the soreness in your incision resolves.  Bathing/Showering  You may shower after you go home. Keep your incision dry for 48 hours. Do not soak in a bathtub, hot tub, or swim until the incision heals completely. You may not shower if you have a hemodialysis catheter.  Incision Care  Clean your incision with mild soap and water after 48 hours. Pat the area dry with a clean towel. You do not need a bandage unless otherwise instructed. Do not apply any ointments or creams to your incision. You may have skin glue on your incision. Do not peel it off. It will come off on its own in about one week. Your arm may swell a bit after surgery. To reduce swelling use pillows to elevate your arm so it is above your heart. Your doctor will tell you if you need to lightly wrap your arm with an ACE bandage.  Diet  Resume your normal diet. There are not special food restrictions following this procedure. In order to heal from your surgery, it is CRITICAL to get adequate nutrition. Your body requires vitamins, minerals, and protein. Vegetables are the best source of vitamins and minerals. Vegetables also provide the perfect balance of protein. Processed food has little nutritional value, so try to avoid this.  Medications  Resume taking all of your medications. If your incision is causing pain, you may take over-the counter pain relievers such as acetaminophen (Tylenol). If you were prescribed a stronger pain medication, please be aware these medications can cause nausea and constipation. Prevent  nausea by taking the medication with a snack or meal. Avoid constipation by drinking plenty of fluids and eating foods with high amount of fiber, such as fruits, vegetables, and grains.  Do not take Tylenol if you are taking prescription pain medications.  Follow up Your surgeon may want to see you in the office following your access surgery. If so, this will be arranged at the time of your surgery.  Please call us immediately for any of the following conditions:  . Increased pain, redness, drainage (pus) from your incision site . Fever of 101 degrees or higher . Severe or worsening pain at your incision site . Hand pain or numbness. .  Reduce your risk of vascular disease:  . Stop smoking. If you would like help, call [facility] at 1-800-QUIT-NOW 510-536-4727) or [place] at [phone]  . Manage your cholesterol . Maintain a desired weight . Control your diabetes . Keep your blood pressure down  Dialysis  It will take several weeks to several months for your new dialysis access to be ready for use. Your surgeon will determine when it is okay to use it. Your nephrologist will continue to direct your dialysis. You can continue to use your Permcath until your new access is ready for use.   10/03/2020 Hannah [name] [id number] 06/10/1965  Surgeon(s): Waynetta Sandy, MD  Procedure(s): Creation right 1st stage basilic vein transposition  x Do not stick fistula for 12 weeks    If you have any questions, please call the office at [phone].

## 2020-10-03 NOTE — Progress Notes (Signed)
PROGRESS NOTE    Hannah [name]  [id number] DOB: 1965-06-19 DOA: 09/26/2020 PCP: Vernie Shanks, MD   Chief Complaint  Patient presents with  . Leg Swelling  Brief Narrative: 55 year old female with CKD 5, anemia, CVA, hypertension, diabetes, hyperlipidemia, multiple sclerosis presents with shortness of breath, lower extremity swelling, onset 2 to 3 days prior to admission.  Patient was seen by PCP and sent to the ED for evaluation.  Recently admitted 2 weeks ago for AKI on CKD 4-renal ultrasound was unremarkable believed to be progression of her CKD, her blood pressure medication were adjusted and was planning for nephrology outpatient follow-up. She was seen in the ED creatinine 5.3 similar to previous with anemia elevated BNP DVT study was negative chest x-ray showed ill-defined opacity consistent with pneumonia in the right middle and lower lobes and some atelectasis.  Nephrology was consulted with her volume overload likely approaching ESRD, patient was admitted for further management. Seen by nephrology started on hemodialysis-now on Monday Wednesday Friday schedule CLIP in processs.  Subjective: Seen this morning resting comfortably on the bedside chair on room air.  Some shortness of breath feels well and appears better than yesterday. No other new complaints.  Assessment & Plan:  Volume overload Longstanding CKD that has progressed to ESRD Uremia/metabolic acidosis: Continue dialysis per nephrology M WF schedule, CL IP in the process discussed with nephrology nurse navigator appreciate vascular and nephrology input status post aVF placement 5/19 by vascular-will need to follow-up with VVS in 6 weeks to check maturation of aVF.  Continue nausea medication as needed.Electrolytes stable.  Complains of some shortness of breath with stat portable chest x-ray.  Essential hypertension,well controlled this morning.  Continue amlodipine carvedilol hydralazine.  Allow room for HD,if  remains poorly controlled can increase hydralazine further.  Monitor.  Type 2 diabetes mellitus hemoglobin A1c 8.7 most recently.  Sugar fairly controlled on  Lantus 10 unit nightly, sliding scale insulin and continue to hold home Tresiba lispro metformin and glipizide. Recent Labs  Lab 10/02/20 1209 10/02/20 1636 10/02/20 2144 10/03/20 0623 10/03/20 1114  GLUCAP 166* 139* 176* 223* 210*   History of  Recent CVA/Hyperlipidemia: Continue on Crestor and resumed Plavix  Today after discussion with Vascular. Some speech issues per daughter for past few days from pain and mostly as she cannont catch her breath. Will get CT head for completeness.  Anemia of chronic disease/renal disease ,maanged by ferric gluconate,ESA by nephrology monitor H&H-holding stable 78 g range. Recent Labs  Lab 09/27/20 0330 09/29/20 1634 10/01/20 0246 10/02/20 0429 10/03/20 0327  HGB 8.0* 8.4* 7.8* 8.1* 7.7*  HCT 24.9* 26.2* 24.7* 25.3* 24.0*   Multiple sclerosis: Not currently on treatment last MRI without any active lesions.  Diet Order            Diet renal/carb modified with fluid restriction Diet-HS Snack? Nothing; Fluid restriction: 1200 mL Fluid; Room service appropriate? Yes; Fluid consistency: Thin  Diet effective now               Patient's Body mass index is 23.99 kg/m. DVT prophylaxis: heparin injection 5,000 Units Start: 09/30/20 0600 Code Status:   Code Status: Full Code  Family Communication: plan of care discussed with patient at bedside. Called and discussed with patient's daughter over the phone.  Status is: Inpatient. Remains inpatient appropriate because:IV treatments appropriate due to intensity of illness or inability to take PO and Inpatient level of care appropriate due to severity of illness  Dispo: The patient is  from: Home lives with the daughter             Anticipated d/c is to: Home once CLIP  Done and okay with nephrology.Continue PT OT has suggested outpatient  PT/supervision assistant              Patient currently is not medically stable to d/c.   Difficult to place patient No  Unresulted Labs (From admission, onward)          Start     Ordered   10/02/20 0500  CBC  Daily,   R      10/01/20 1437   09/28/20 0500  Renal function panel  Daily,   R      09/27/20 1218   Signed and Held  Hepatitis B surface antigen  (New Patient [facility] (Hepatitis B))  Once,   R        Signed and Held   Signed and Held  Hepatitis B surface antibody  (New Patient [facility] (Hepatitis B))  Once,   R        Signed and Held   Signed and Held  Hepatitis B core antibody, total  (New [facility] (Hepatitis B))  Once,   R        Signed and Held   Signed and Held  Hepatitis C antibody  (New Patient [facility] (Hepatitis B))  Once,   R        Signed and Held         Medications reviewed:  Scheduled Meds: . amLODipine  10 mg Oral Daily  . carvedilol  6.25 mg Oral BID WC  . Chlorhexidine Gluconate Cloth  6 each Topical Q0600  . darbepoetin (ARANESP) injection - DIALYSIS  150 mcg Intravenous Q Mon-HD  . famotidine  20 mg Oral Daily  . FLUoxetine  20 mg Oral Daily  . heparin  5,000 Units Subcutaneous Q8H  . hydrALAZINE  50 mg Oral Q8H  . insulin aspart  0-6 Units Subcutaneous TID WC  . insulin glargine  10 Units Subcutaneous QHS  . melatonin  3 mg Oral QHS  . metoCLOPramide  5 mg Oral BID  . rosuvastatin  10 mg Oral Daily  . sodium chloride flush  3 mL Intravenous Q12H  . tiZANidine  2 mg Oral Q8H   Continuous Infusions: . sodium chloride Stopped (09/28/20 1354)    Consultants:see note  Procedures:see note  Antimicrobials: Anti-infectives (From admission, onward)   Start     Dose/Rate Route Frequency Ordered Stop   10/02/20 1049  ceFAZolin (ANCEF) 2-4 GM/100ML-% IVPB       Note to Pharmacy: Claybon Jabs   : cabinet override      10/02/20 1049 10/02/20 2259   09/29/20 1330  ceFAZolin (ANCEF) 2-4 GM/100ML-% IVPB       Note to Pharmacy: Cecile Sheerer   : cabinet override      09/29/20 1330 09/30/20 0144   09/29/20 0000  ceFAZolin (ANCEF) 1 g in sodium chloride 0.9 % 100 mL IVPB        1 g 200 mL/hr over 30 Minutes Intravenous To Radiology 09/27/20 1457 09/29/20 1419     Culture/Microbiology No results found for: SDES, SPECREQUEST, CULT, REPTSTATUS  Other culture-see note  Objective: Vitals: Today's Vitals   10/02/20 2248 10/03/20 0406 10/03/20 0837 10/03/20 0953  BP:  (!) 151/74 134/63   Pulse:  100 90   Resp:  19 17   Temp:  98 F (  36.7 C) 98.3 F (36.8 C)   TempSrc:  Oral Oral   SpO2:  100% 98%   Weight:  59.5 kg    Height:      PainSc: Asleep 9   0-No pain    Intake/Output Summary (Last 24 hours) at 10/03/2020 1149 Last data filed at 10/03/2020 0400 Gross per 24 hour  Intake 120 ml  Output --  Net 120 ml   Filed Weights   10/01/20 1455 10/01/20 1830 10/03/20 0406  Weight: 59.5 kg 58 kg 59.5 kg   Weight change: 0 kg  Intake/Output from previous day: 05/19 0701 - 05/20 0700 In: 120 [P.O.:120] Out: -  Intake/Output this shift: No intake/output data recorded. Filed Weights   10/01/20 1455 10/01/20 1830 10/03/20 0406  Weight: 59.5 kg 58 kg 59.5 kg    Examination: General exam: AAO, at baseline obese, appears older than stated age, weak appearing. HEENT:Oral mucosa moist, Ear/Nose WNL grossly, dentition normal. Respiratory system: bilaterally diminished, no wheezing or crackles,no use of accessory muscle Cardiovascular system: S1 & S2 +, No JVD,. Gastrointestinal system: Abdomen soft, NT,ND, BS+ Nervous System:Alert, awake, moving extremities and grossly nonfocal Extremities: no edema, distal peripheral pulses palpable.  Skin: No rashes,no icterus. MSK: Normal muscle bulk,tone, power  Data Reviewed: I have personally reviewed following labs and imaging studies CBC: Recent Labs  Lab 09/26/20 2127 09/27/20 0330 09/29/20 1634 10/01/20 0246 10/02/20 0429 10/03/20 0327  WBC 7.0 6.3 6.6 7.8  8.6 7.5  NEUTROABS 4.6  --   --   --   --   --   HGB 8.2* 8.0* 8.4* 7.8* 8.1* 7.7*  HCT 25.5* 24.9* 26.2* 24.7* 25.3* 24.0*  MCV 87.0 86.8 87.6 88.2 86.9 89.2  PLT 222 213 218 188 192 0000000   Basic Metabolic Panel: Recent Labs  Lab 09/26/20 1731 09/27/20 0330 09/29/20 0037 09/30/20 0426 10/01/20 0246 10/02/20 0429 10/03/20 0327  NA 137   < > 136 137 132* 132* 128*  K 4.1   < > 3.7 3.7 3.7 3.4* 3.6  CL 106   < > 101 103 98 98 96*  CO2 23   < > '27 28 25 26 25  '$ GLUCOSE 175*   < > 225* 99 158* 95 164*  BUN 37*   < > 32* 14 6 <5* 7  CREATININE 5.34*   < > 5.78* 3.75* 2.62* 2.58* 3.90*  CALCIUM 8.8*   < > 8.5* 8.8* 8.7* 8.8* 8.7*  MG 2.1  --   --   --   --   --   --   PHOS 4.2   < > 4.9* 3.7 2.7 2.2* 3.1   < > = values in this interval not displayed.   GFR: Estimated Creatinine Clearance: 12.7 mL/min (A) (by C-G formula based on SCr of 3.9 mg/dL (H)). Liver Function Tests: Recent Labs  Lab 09/29/20 0037 09/30/20 0426 10/01/20 0246 10/02/20 0429 10/03/20 0327  ALBUMIN 2.5* 2.7* 2.6* 2.9* 2.7*   No results for input(s): LIPASE, AMYLASE in the last 168 hours. No results for input(s): AMMONIA in the last 168 hours. Coagulation Profile: No results for input(s): INR, PROTIME in the last 168 hours. Cardiac Enzymes: No results for input(s): CKTOTAL, CKMB, CKMBINDEX, TROPONINI in the last 168 hours. BNP (last 3 results) No results for input(s): PROBNP in the last 8760 hours. HbA1C: No results for input(s): HGBA1C in the last 72 hours. CBG: Recent Labs  Lab 10/02/20 1209 10/02/20 1636 10/02/20 2144 10/03/20 [id number] 10/03/20 1114  GLUCAP 166* 139* 176* 223* 210*   Lipid Profile: No results for input(s): CHOL, HDL, LDLCALC, TRIG, CHOLHDL, LDLDIRECT in the last 72 hours. Thyroid Function Tests: No results for input(s): TSH, T4TOTAL, FREET4, T3FREE, THYROIDAB in the last 72 hours. Anemia Panel: No results for input(s): VITAMINB12, FOLATE, FERRITIN, TIBC, IRON, RETICCTPCT in  the last 72 hours. Sepsis Labs: No results for input(s): PROCALCITON, LATICACIDVEN in the last 168 hours.  Recent Results (from the past 240 hour(s))  Resp Panel by RT-PCR (Flu A&B, Covid) Nasopharyngeal Swab     Status: None   Collection Time: 09/26/20  9:27 PM   Specimen: Nasopharyngeal Swab; Nasopharyngeal(NP) swabs in vial transport medium  Result Value Ref Range Status   SARS Coronavirus 2 by RT PCR NEGATIVE NEGATIVE Final    Comment: (NOTE) SARS-CoV-2 target nucleic acids are NOT DETECTED.  The SARS-CoV-2 RNA is generally detectable in upper respiratory specimens during the acute phase of infection. The lowest concentration of SARS-CoV-2 viral copies this assay can detect is 138 copies/mL. A negative result does not preclude SARS-Cov-2 infection and should not be used as the sole basis for treatment or other patient management decisions. A negative result may occur with  improper specimen collection/handling, submission of specimen other than nasopharyngeal swab, presence of viral mutation(s) within the areas targeted by this assay, and inadequate number of viral copies(<138 copies/mL). A negative result must be combined with clinical observations, patient history, and epidemiological information. The expected result is Negative.  Fact Sheet for Patients:  EntrepreneurPulse.com.au  Fact Sheet for Healthcare Providers:  IncredibleEmployment.be  This test is no t yet approved or cleared by the [place] FDA and  has been authorized for detection and/or diagnosis of SARS-CoV-2 by FDA under an Emergency Use Authorization (EUA). This EUA will remain  in effect (meaning this test can be used) for the duration of the COVID-19 declaration under Section 564(b)(1) of the Act, 21 U.S.C.section 360bbb-3(b)(1), unless the authorization is terminated  or revoked sooner.       Influenza A by PCR NEGATIVE NEGATIVE Final   Influenza B by PCR  NEGATIVE NEGATIVE Final    Comment: (NOTE) The Xpert Xpress SARS-CoV-2/FLU/RSV plus assay is intended as an aid in the diagnosis of influenza from Nasopharyngeal swab specimens and should not be used as a sole basis for treatment. Nasal washings and aspirates are unacceptable for Xpert Xpress SARS-CoV-2/FLU/RSV testing.  Fact Sheet for Patients: EntrepreneurPulse.com.au  Fact Sheet for Healthcare Providers: IncredibleEmployment.be  This test is not yet approved or cleared by the [place] FDA and has been authorized for detection and/or diagnosis of SARS-CoV-2 by FDA under an Emergency Use Authorization (EUA). This EUA will remain in effect (meaning this test can be used) for the duration of the COVID-19 declaration under Section 564(b)(1) of the Act, 21 U.S.C. section 360bbb-3(b)(1), unless the authorization is terminated or revoked.  Performed at [facility], [place] [address] [place], [place] [address]      Radiology Studies: No results found.   LOS: 6 days   Antonieta Pert, MD [facility]  10/03/2020, 11:49 AM

## 2020-10-03 NOTE — TOC Progression Note (Signed)
Transition of Care [facility]) - Progression Note    Patient Details  Name: Hannah [name] MRN: [id number] Date of Birth: Jun 04, 1965  Transition of Care [facility]) CM/SW Contact  Milinda Antis, [place] Phone Number: 10/03/2020, 11:21 AM  Clinical Narrative:    CSW met with the patient to prepare for d/c.  The patient's phone number, address, and PCP were verified.  Patient informed CSW that she is able to get to outpatient appointments because her daughter lives with her and transports.  The patient is hopeful to go home today after dialysis.  No other concerns noted.         Expected Discharge Plan and Services                                                 Social Determinants of Health (SDOH) Interventions    Readmission Risk Interventions No flowsheet data found.

## 2020-10-03 NOTE — Progress Notes (Signed)
[facility] p[facility] and chat about the rt groin joint pain that that daughter wants an xray done. Arthor Captain LPN

## 2020-10-03 NOTE — Progress Notes (Signed)
Physical Therapy Treatment Patient Details Name: Hannah [name] MRN: [id number] DOB: 08/01/1965 Today's Date: 10/03/2020    History of Present Illness Hannah [name] is a 55 y.o. female presented with shortness of breath and lower extremity swelling. Admitted on 09/26/20 with Volume overload.  Additionally, pt with ESRD and HD has been initiated this admission.  Pt underwent AV fistula placement 5/19. Pt with medical history significant of CKD 5, anemia, CVA, diabetes, hypertension, hyperlipidemia, multiple sclerosis    PT Comments    Pt required min assist transfers and min guard assist amb with RW in room. Pt in recliner with feet elevated at end of session.   Follow Up Recommendations  Outpatient PT;Supervision/Assistance - 24 hour (pt prefers outpt PT)     Equipment Recommendations  None recommended by PT    Recommendations for Other Services       Precautions / Restrictions Precautions Precautions: Fall;Other (comment) Precaution Comments: Baseline RT sided weakness due to CVA    Mobility  Bed Mobility Overal bed mobility: Needs Assistance             General bed mobility comments: Pt sitting EOB on arrival. Reports NT assisted her with supine to sit.    Transfers Overall transfer level: Needs assistance Equipment used: Rolling walker (2 wheeled) Transfers: Sit to/from [facility] Sit to Stand: Min assist Stand pivot transfers: Min assist       General transfer comment: increased time, min assist to stabilize balance  Ambulation/Gait Ambulation/Gait assistance: Min guard Gait Distance (Feet): 7 Feet Assistive device: Rolling walker (2 wheeled) Gait Pattern/deviations: Step-to pattern;Decreased stance time - right;Decreased dorsiflexion - right;Decreased stride length Gait velocity: decreased Gait velocity interpretation: <1.31 ft/sec, indicative of household ambulator General Gait Details: SPT bed to [facility] prior to amb to recliner. Slow, guarded  gait.   Stairs             Wheelchair Mobility    Modified Rankin (Stroke Patients Only)       Balance Overall balance assessment: Needs assistance Sitting-balance support: No upper extremity supported;Feet supported Sitting balance-Leahy Scale: Good     Standing balance support: Bilateral upper extremity supported;During functional activity Standing balance-Leahy Scale: Poor Standing balance comment: reliant on external assist                            Cognition Arousal/Alertness: Awake/alert Behavior During Therapy: WFL for tasks assessed/performed Overall Cognitive Status: Within Functional Limits for tasks assessed                                        Exercises      General Comments        Pertinent Vitals/Pain Pain Assessment: No/denies pain    Home Living                      Prior Function            PT Goals (current goals can now be found in the care plan section) Acute Rehab PT Goals Patient Stated Goal: home Progress towards PT goals: Progressing toward goals    Frequency    Min 3X/week      PT Plan Current plan remains appropriate    Co-evaluation              AM-PAC PT "6 Clicks" Mobility  Outcome Measure  Help needed turning from your back to your side while in a flat bed without using bedrails?: A Little Help needed moving from lying on your back to sitting on the side of a flat bed without using bedrails?: A Little Help needed moving to and from a bed to a chair (including a wheelchair)?: A Little Help needed standing up from a chair using your arms (e.g., wheelchair or bedside chair)?: A Little Help needed to walk in hospital room?: A Little Help needed climbing 3-5 steps with a railing? : A Lot 6 Click Score: 17    End of Session Equipment Utilized During Treatment: Gait belt Activity Tolerance: Patient tolerated treatment well Patient left: in chair;with call bell/phone  within reach;with chair alarm set Nurse Communication: Mobility status PT Visit Diagnosis: Other abnormalities of gait and mobility (R26.89);Muscle weakness (generalized) (M62.81)     Time: [id number] PT Time Calculation (min) (ACUTE ONLY): 24 min  Charges:  $Gait Training: 23-37 mins                     Lorrin Goodell, [place]  Office # [phone] Pager [phone]    Lorriane Shire 10/03/2020, 8:32 AM

## 2020-10-03 NOTE — Progress Notes (Signed)
Occupational Therapy Treatment Patient Details Name: Hannah [name] MRN: [id number] DOB: 1966/04/01 Today's Date: 10/03/2020    History of present illness Clester Cisek is a 55 y.o. female presented with shortness of breath and lower extremity swelling. Admitted on 09/26/20 with Volume overload.  Additionally, pt with ESRD and HD has been initiated this admission.  Pt underwent AV fistula placement 5/19. Pt with medical history significant of CKD 5, anemia, CVA, diabetes, hypertension, hyperlipidemia, multiple sclerosis   OT comments  Pt making good progress toward all goals at min guard level.  Pt demonstrates a R inattention that does affect safety when walking.  Family member is always with her when she walks at home.     Follow Up Recommendations  Outpatient OT;Supervision/Assistance - 24 hour    Equipment Recommendations  None recommended by OT    Recommendations for Other Services      Precautions / Restrictions Precautions Precautions: Fall;Other (comment) Precaution Comments: Baseline RT sided weakness due to CVA. Pt states last fall was 1.5 years ago.       Mobility Bed Mobility Overal bed mobility: Needs Assistance             General bed mobility comments: Pt in chair on arrival.    Transfers Overall transfer level: Needs assistance Equipment used: Rolling walker (2 wheeled) Transfers: Sit to/from [facility] Sit to Stand: Min guard Stand pivot transfers: Min guard       General transfer comment: increasd time and cues to line herself up correctly with chair behind her.    Balance Overall balance assessment: Needs assistance Sitting-balance support: No upper extremity supported;Feet supported Sitting balance-Leahy Scale: Good     Standing balance support: Bilateral upper extremity supported;During functional activity Standing balance-Leahy Scale: Poor Standing balance comment: reliant on external assist                            ADL either performed or assessed with clinical judgement   ADL Overall ADL's : Needs assistance/impaired Eating/Feeding: Set up;Sitting Eating/Feeding Details (indicate cue type and reason): Pt sat in chair for breakfast. Assist given to open fruit cup. Grooming: Set up;Standing;Sitting;Supervision/safety Grooming Details (indicate cue type and reason): Pt stood at sink for first 3 minutes and then finished in sitting in front of sink.  Given extra time, pt did not need physcial assist.                 Toilet Transfer: Min guard;Ambulation;RW;Comfort height toilet Toilet Transfer Details (indicate cue type and reason): Pt walked to bathroom to toilet with min guard.  Pt rquired min guard to power up from comfort commode to walker. Toileting- Water quality scientist and Hygiene: Min guard;Sitting/lateral lean Toileting - Clothing Manipulation Details (indicate cue type and reason): min guard A sit<>stand     Functional mobility during ADLs: Min guard;Rolling walker General ADL Comments: pt does well with adls given extra time.  Pt does demonstrate a R innattention when ambulating in room as she will occasionally run into the wall and not make it into doorway space correctly.     Vision   Vision Assessment?: Vision impaired- to be further tested in functional context Additional Comments: Pt with R inattention.   Perception     Praxis      Cognition Arousal/Alertness: Awake/alert Behavior During Therapy: WFL for tasks assessed/performed Overall Cognitive Status: Within Functional Limits for tasks assessed  General Comments: Pt states she is groggy from pain meds but feels fine otherwise.        Exercises     Shoulder Instructions       General Comments Pt, with extra time, is increaseing independence with all adls.    Pertinent Vitals/ Pain       Pain Assessment: No/denies pain  Home Living                                           Prior Functioning/Environment              Frequency  Min 2X/week        Progress Toward Goals  OT Goals(current goals can now be found in the care plan section)  Progress towards OT goals: Progressing toward goals  Acute Rehab OT Goals Patient Stated Goal: home OT Goal Formulation: With patient Time For Goal Achievement: 10/15/20 Potential to Achieve Goals: Good ADL Goals Pt Will Perform Grooming: with supervision;standing Pt Will Perform Upper Body Bathing: with supervision;sitting Pt Will Perform Lower Body Bathing: with supervision;sit to/from stand Pt Will Perform Lower Body Dressing: with modified independence;sit to/from stand Pt Will Transfer to Toilet: with supervision;ambulating;bedside commode Pt Will Perform Toileting - Clothing Manipulation and hygiene: with supervision;sit to/from stand Additional ADL Goal #1: Pt will be Mod in and OOB for basic ADLs (increaased time) Additional ADL Goal #2: Patient will identify at least 3 energy conservation strategies to employ at home in order to maximize function and quality of life and decrease caregiver burden while preventing exacerbation of symptoms and rehospitalization.  Plan Discharge plan remains appropriate    Co-evaluation                 AM-PAC OT "6 Clicks" Daily Activity     Outcome Measure   Help from another person eating meals?: A Little Help from another person taking care of personal grooming?: A Little Help from another person toileting, which includes using toliet, bedpan, or urinal?: A Little Help from another person bathing (including washing, rinsing, drying)?: A Little Help from another person to put on and taking off regular upper body clothing?: A Little Help from another person to put on and taking off regular lower body clothing?: A Little 6 Click Score: 18    End of Session Equipment Utilized During Treatment: Gait belt;Rolling walker  OT  Visit Diagnosis: Unsteadiness on feet (R26.81);Other abnormalities of gait and mobility (R26.89);Pain;Muscle weakness (generalized) (M62.81);Hemiplegia and hemiparesis Hemiplegia - Right/Left: Right Hemiplegia - dominant/non-dominant: Dominant Hemiplegia - caused by: Cerebral infarction   Activity Tolerance Patient tolerated treatment well   Patient Left in chair;with call bell/phone within reach;with chair alarm set   Nurse Communication Mobility status        Time: [id number] OT Time Calculation (min): 24 min  Charges: OT General Charges $OT Visit: 1 Visit OT Treatments $Self Care/Home Management : 23-37 mins   Glenford Peers 10/03/2020, 10:15 AM

## 2020-10-04 DIAGNOSIS — I10 Essential (primary) hypertension: Secondary | ICD-10-CM | POA: Diagnosis not present

## 2020-10-04 LAB — RENAL FUNCTION PANEL
Albumin: 2.8 g/dL — ABNORMAL LOW (ref 3.5–5.0)
Anion gap: 8 (ref 5–15)
BUN: <5 mg/dL — ABNORMAL LOW (ref 6–20)
CO2: 27 mmol/L (ref 22–32)
Calcium: 8.8 mg/dL — ABNORMAL LOW (ref 8.9–10.3)
Chloride: 97 mmol/L — ABNORMAL LOW (ref 98–111)
Creatinine, Ser: 2.62 mg/dL — ABNORMAL HIGH (ref 0.44–1.00)
GFR, Estimated: 21 mL/min — ABNORMAL LOW (ref >60)
Glucose, Bld: 110 mg/dL — ABNORMAL HIGH (ref 70–99)
Phosphorus: 2.3 mg/dL — ABNORMAL LOW (ref 2.5–4.6)
Potassium: 3.2 mmol/L — ABNORMAL LOW (ref 3.5–5.1)
Sodium: 132 mmol/L — ABNORMAL LOW (ref 135–145)

## 2020-10-04 LAB — CBC
HCT: 25 % — ABNORMAL LOW (ref 36.0–46.0)
Hemoglobin: 8.1 g/dL — ABNORMAL LOW (ref 12.0–15.0)
MCH: 29.9 pg (ref 26.0–34.0)
MCHC: 32.4 g/dL (ref 30.0–36.0)
MCV: 92.3 fL (ref 80.0–100.0)
Platelets: 199 10*3/uL (ref 150–400)
RBC: 2.71 MIL/uL — ABNORMAL LOW (ref 3.87–5.11)
RDW: 14.6 % (ref 11.5–15.5)
WBC: 8.3 10*3/uL (ref 4.0–10.5)
nRBC: 1.8 % — ABNORMAL HIGH (ref 0.0–0.2)

## 2020-10-04 LAB — GLUCOSE, CAPILLARY
Glucose-Capillary: 108 mg/dL — ABNORMAL HIGH (ref 70–99)
Glucose-Capillary: 196 mg/dL — ABNORMAL HIGH (ref 70–99)
Glucose-Capillary: 189 mg/dL — ABNORMAL HIGH (ref 70–99)
Glucose-Capillary: 286 mg/dL — ABNORMAL HIGH (ref 70–99)

## 2020-10-04 MED ORDER — METOPROLOL TARTRATE 5 MG/5ML IV SOLN
5.0000 mg | Freq: Once | INTRAVENOUS | Status: AC
  Administered 2020-10-04: 5 mg via INTRAVENOUS
  Filled 2020-10-04: qty 5

## 2020-10-04 MED ORDER — GABAPENTIN 100 MG PO CAPS
100.0000 mg | ORAL_CAPSULE | Freq: Three times a day (TID) | ORAL | Status: DC
  Administered 2020-10-04 – 2020-10-13 (×25): 100 mg via ORAL
  Filled 2020-10-04 (×25): qty 1

## 2020-10-04 NOTE — Plan of Care (Signed)

## 2020-10-04 NOTE — Progress Notes (Signed)
Subjective: Patient tolerated dialysis yesterday with no problems.  Continues to have leg pain.  X-ray without significant abnormality.  Patient is stable for discharge from nephrology standpoint and has dialysis set up for Monday outpatient.   Objective Vital signs in last 24 hours: Vitals:   10/03/20 2041 10/04/20 0416 10/04/20 0622 10/04/20 0810  BP: (!) 162/69 (!) 163/73 119/64 (!) 97/51  Pulse: 97 (!) 114 93 80  Resp: '19 19 18 19  '$ Temp: 98.6 F (37 C) 98.4 F (36.9 C) 98.2 F (36.8 C) 98.5 F (36.9 C)  TempSrc: Oral Oral Oral Oral  SpO2: 95% 100% 100% 100%  Weight:      Height:       Weight change: -0.3 kg  Intake/Output Summary (Last 24 hours) at 10/04/2020 0930 Last data filed at 10/03/2020 1754 Gross per 24 hour  Intake --  Output 2000 ml  Net -2000 ml    Assessment/Plan: [name] with DM and HTN with progressive CKD and now what appears to be uremic symptoms.  My recommendation would be to initiate dialysis this admission 1.Renal- advanced CKD that has been longstanding now felt to be ESRD-  Has now progressed to the point where she is uremic and deemed to be ESRD needing to start dialysis.   S/p AVF 5/19; appreciate help from [facility].  Continue dialysis on MWF schedule while inpatient however she is stable to discharge from nephrology standpoint.  This arranged to start on Monday at [place] 2. Hypertension/volume  -blood pressure overall improved with volume removal on dialysis as well as adjustment of medications 3. Anemia  -  iron stores low, s/p repletion and now on ESA 18mg weekly. 4. Bones-  PTH 84. No treatment needed 5. Acidosis- Resolved    [name]   Labs: Basic Metabolic Panel: Recent Labs  Lab 10/02/20 0429 10/03/20 0327 10/04/20 0423  NA 132* 128* 132*  K 3.4* 3.6 3.2*  CL 98 96* 97*  CO2 '26 25 27  '$ GLUCOSE 95 164* 110*  BUN <5* 7 <5*  CREATININE 2.58* 3.90* 2.62*  CALCIUM 8.8* 8.7* 8.8*  PHOS 2.2* 3.1 2.3*   Liver Function  Tests: Recent Labs  Lab 10/02/20 0429 10/03/20 0327 10/04/20 0423  ALBUMIN 2.9* 2.7* 2.8*   No results for input(s): LIPASE, AMYLASE in the last 168 hours. No results for input(s): AMMONIA in the last 168 hours. CBC: Recent Labs  Lab 09/29/20 1634 10/01/20 0246 10/02/20 0429 10/03/20 0327 10/04/20 0423  WBC 6.6 7.8 8.6 7.5 8.3  HGB 8.4* 7.8* 8.1* 7.7* 8.1*  HCT 26.2* 24.7* 25.3* 24.0* 25.0*  MCV 87.6 88.2 86.9 89.2 92.3  PLT 218 188 192 179 199   Cardiac Enzymes: No results for input(s): CKTOTAL, CKMB, CKMBINDEX, TROPONINI in the last 168 hours. CBG: Recent Labs  Lab 10/03/20 1114 10/03/20 1352 10/03/20 1859 10/03/20 2124 10/04/20 0641  GLUCAP 210* 226* 100* 145* 108*    Iron Studies:  No results for input(s): IRON, TIBC, TRANSFERRIN, FERRITIN in the last 72 hours. Studies/Results: CT HEAD WO CONTRAST  Result Date: 10/03/2020 CLINICAL DATA:  Headache.  History multiple sclerosis EXAM: CT HEAD WITHOUT CONTRAST TECHNIQUE: Contiguous axial images were obtained from the base of the skull through the vertex without intravenous contrast. COMPARISON:  CT head 06/09/2020.  MRI head 06/09/2020 FINDINGS: Brain: Moderate white matter changes bilaterally similar to prior MRI. Focal hypodensity in the posterior limb internal capsule on the left likely an area of chronic infarction. Mild hypodensity  in the pons centrally unchanged from the prior study Ventricle size normal.  No acute infarct, hemorrhage, mass Vascular: Negative for hyperdense vessel Skull: Negative Sinuses/Orbits: Mild mucosal edema paranasal sinuses involving the sphenoid sinus. Bilateral cataract extraction Other: None IMPRESSION: Moderate to extensive white matter hypodensity bilaterally. This is similar to prior CT and MRI. This may represent demyelinating disease given history of multiple sclerosis. Chronic ischemic changes could have a similar appearance. No acute abnormality. Electronically Signed   By: Franchot Gallo M.D.   On: 10/03/2020 20:07   DG Chest Port 1 View  Result Date: 10/03/2020 CLINICAL DATA:  Shortness of breath EXAM: PORTABLE CHEST 1 VIEW COMPARISON:  09/26/2020 FINDINGS: Cardiac shadow is enlarged but stable. Dialysis catheter is now seen in satisfactory position on the right. Lungs are well aerated bilaterally. Resolution of previously seen basilar opacities. No new focal abnormality is noted. IMPRESSION: No acute abnormality seen. Electronically Signed   By: Inez Catalina M.D.   On: 10/03/2020 14:59   DG HIP UNILAT WITH PELVIS 1V RIGHT  Result Date: 10/03/2020 CLINICAL DATA:  Right hip pain. EXAM: DG HIP (WITH OR WITHOUT PELVIS) 1V RIGHT COMPARISON:  None. FINDINGS: There is no evidence of hip fracture or dislocation. There is no evidence of arthropathy or other focal bone abnormality. IMPRESSION: Negative. Electronically Signed   By: Marijo Conception M.D.   On: 10/03/2020 21:09   Medications: Infusions: . sodium chloride Stopped (09/28/20 1354)    Scheduled Medications: . amLODipine  10 mg Oral Daily  . carvedilol  6.25 mg Oral BID WC  . Chlorhexidine Gluconate Cloth  6 each Topical Q0600  . clopidogrel  75 mg Oral Daily  . darbepoetin (ARANESP) injection - DIALYSIS  150 mcg Intravenous Q Mon-HD  . famotidine  20 mg Oral Daily  . FLUoxetine  20 mg Oral Daily  . heparin  5,000 Units Subcutaneous Q8H  . hydrALAZINE  50 mg Oral Q8H  . insulin aspart  0-6 Units Subcutaneous TID WC  . insulin glargine  10 Units Subcutaneous QHS  . melatonin  3 mg Oral QHS  . metoCLOPramide  5 mg Oral BID  . rosuvastatin  10 mg Oral Daily  . sodium chloride flush  3 mL Intravenous Q12H  . tiZANidine  2 mg Oral Q8H    have reviewed scheduled and prn medications.\  Vitals:   10/04/20 0622 10/04/20 0810  BP: 119/64 (!) 97/51  Pulse: 93 80  Resp: 18 19  Temp: 98.2 F (36.8 C) 98.5 F (36.9 C)  SpO2: 100% 100%     Physical Exam: General:  NAD- sitting in chair Heart: Normal  rate Lungs: No increased work of breathing, bilateral chest rise Abdomen: soft, non tender Extremities: Warm and well perfused    10/04/2020,9:30 AM  LOS: 7 days

## 2020-10-04 NOTE — Progress Notes (Signed)
[facility] notified that patient MEWS yellow due to HR 120's patient was asymptomatic. New orders were received and medication given HR decreased to mid 80's NSR. Arthor Captain LPN

## 2020-10-04 NOTE — Progress Notes (Addendum)
PROGRESS NOTE    Hannah [name]  [id number] DOB: [birth year] DOA: 09/26/2020 PCP: Vernie Shanks, MD   Chief Complaint  Patient presents with  . Leg Swelling  Brief Narrative: 55 year old female with CKD 5, anemia, CVA, hypertension, diabetes, hyperlipidemia, multiple sclerosis presents with shortness of breath, lower extremity swelling, onset 2 to 3 days prior to admission.  Patient was seen by PCP and sent to the ED for evaluation.  Recently admitted 2 weeks ago for AKI on CKD 4-renal ultrasound was unremarkable believed to be progression of her CKD, her blood pressure medication were adjusted and was planning for nephrology outpatient follow-up. She was seen in the ED creatinine 5.3 similar to previous with anemia elevated BNP DVT study was negative chest x-ray showed ill-defined opacity consistent with pneumonia in the right middle and lower lobes and some atelectasis.  Nephrology was consulted with her volume overload likely approaching ESRD, patient was admitted for further management. Seen by nephrology started on hemodialysis-now on Monday Wednesday Friday schedule. Outpatient dialysis ha been set up starting 5/23 at TCU.  Subjective: Seen and examined this morning.  She was resting in the bedside chair not in distress was nasal cannula nursing arrived to remove the oxygen and she was saturating 100%.  Complains of weakness on the right side from previous stroke.  Has no new complaints , speech is slow ( not new)-appears clear and better today. Overnight afebrile Tolerated dialysis well yesterday. Complaining of right leg hip pain x-ray overnight was unremarkable for right hip  Assessment & Plan: Longstanding CKD that has progressed to ESRD w/ Volume overload Uremia/metabolic acidosis: Nephrology following, on HD MWF, status post aVF 5/19.  Outpatient dialysis has been arranged starting from Monday at [facility].  No nausea or vomiting.  Fluid status is stable. Recent Labs  Lab  09/30/20 0426 10/01/20 0246 10/02/20 0429 10/03/20 0327 10/04/20 0423  BUN 14 6 <5* 7 <5*  CREATININE 3.75* 2.62* 2.58* 3.90* 2.62*   Essential hypertension, BP soft this morning has been in 180s last night. Monitor she is on multiple medication - amlodipine 10 mg carvedilol 6.25 mg  Hydralazine 50 mg.  Monitor  Type 2 diabetes mellitus hemoglobin A1c 8.7 most recently. Sugar fairly controlled on  Lantus 10 unit nightly while here can resume her home Tresiba at lower dose, was also taking insulin 10 units 3 times daily at home - discontinue glipizide. Recent Labs  Lab 10/03/20 1114 10/03/20 1352 10/03/20 1859 10/03/20 2124 10/04/20 0641  GLUCAP 210* 226* 100* 145* 108*   History of  Recent CVA in jan 2022 w/ rt sided spastic weakness/Hyperlipidemia: Continue on Crestor and Plavix .patient based on previous documentation having dysarthric speech since jan 2022.  Some speech issues per daughter for past few days from pain and mostly as she cannont catch her breath.  Her lungs are clear her CT head shows will get CT head moderate extensive white matter hypodensity bilaterally similar to prior CT and MRI may represent demyelinating disease with history of multiple sclerosis.  She will need follow-up with neurology as outpatient  Anemia of chronic disease/renal disease ,maanged by ferric gluconate,ESA by nephrology monitor H&H-holding stable 78 g range. Recent Labs  Lab 09/29/20 1634 10/01/20 0246 10/02/20 0429 10/03/20 0327 10/04/20 0423  HGB 8.4* 7.8* 8.1* 7.7* 8.1*  HCT 26.2* 24.7* 25.3* 24.0* 25.0*   Multiple sclerosis:??MS for years as per daughter patient was followed supposed to have a follow-up with Dr. Leonie Man from last discharge back in February but  it appears he has not followed up.  I made internal referral again. not currently on treatment last MRI without any active lesions.  CT head today shows similar white matter hypodensity bilaterally  Right-sided pain and back and  leg/with right-sided stroke questionable MS: X-ray of right hip no acute finding.  Add Neurontin low-dose, she will need close follow-up with neurology reinforced.  Diet Order            Diet renal/carb modified with fluid restriction Diet-HS Snack? Nothing; Fluid restriction: 1200 mL Fluid; Room service appropriate? Yes; Fluid consistency: Thin  Diet effective now               Patient's Body mass index is 23.06 kg/m. DVT prophylaxis: heparin injection 5,000 Units Start: 09/30/20 0600 Code Status:   Code Status: Full Code  Family Communication: plan of care discussed with patient at bedside. Called and discussed with patient's daughter over the phone 5/20. I spoke with patient's daughter Melvenia Beam [id number].  Status is: Inpatient. Remains inpatient appropriate because:IV treatments appropriate due to intensity of illness or inability to take PO and Inpatient level of care appropriate due to severity of illness  Dispo: The patient is from: Home lives with the daughter             Anticipated d/c is to: Home hopefully tomorrow               Patient currently is not medically stable to d/c.   Difficult to place patient No  Unresulted Labs (From admission, onward)          Start     Ordered   09/28/20 0500  Renal function panel  Daily,   R      09/27/20 1218   Signed and Held  Hepatitis B surface antigen  (New Patient [facility] (Hepatitis B))  Once,   R        Signed and Held   Signed and Held  Hepatitis B surface antibody  (New Patient [facility] (Hepatitis B))  Once,   R        Signed and Held   Signed and Held  Hepatitis B core antibody, total  (New Patient [facility] (Hepatitis B))  Once,   R        Signed and Held   Signed and Held  Hepatitis C antibody  (New Patient [facility] (Hepatitis B))  Once,   R        Signed and Held         Medications reviewed:  Scheduled Meds: . amLODipine  10 mg Oral Daily  . carvedilol  6.25 mg Oral BID WC  . Chlorhexidine Gluconate Cloth  6  each Topical Q0600  . clopidogrel  75 mg Oral Daily  . darbepoetin (ARANESP) injection - DIALYSIS  150 mcg Intravenous Q Mon-HD  . famotidine  20 mg Oral Daily  . FLUoxetine  20 mg Oral Daily  . heparin  5,000 Units Subcutaneous Q8H  . hydrALAZINE  50 mg Oral Q8H  . insulin aspart  0-6 Units Subcutaneous TID WC  . insulin glargine  10 Units Subcutaneous QHS  . melatonin  3 mg Oral QHS  . metoCLOPramide  5 mg Oral BID  . rosuvastatin  10 mg Oral Daily  . sodium chloride flush  3 mL Intravenous Q12H  . tiZANidine  2 mg Oral Q8H   Continuous Infusions: . sodium chloride Stopped (09/28/20 1354)    Consultants:see note  Procedures:see note  Antimicrobials: Anti-infectives (From admission, onward)   Start     Dose/Rate Route Frequency Ordered Stop   10/02/20 1049  ceFAZolin (ANCEF) 2-4 GM/100ML-% IVPB       Note to Pharmacy: Claybon Jabs   : cabinet override      10/02/20 1049 10/02/20 2259   09/29/20 1330  ceFAZolin (ANCEF) 2-4 GM/100ML-% IVPB       Note to Pharmacy: Cecile Sheerer   : cabinet override      09/29/20 1330 09/30/20 0144   09/29/20 0000  ceFAZolin (ANCEF) 1 g in sodium chloride 0.9 % 100 mL IVPB        1 g 200 mL/hr over 30 Minutes Intravenous To Radiology 09/27/20 1457 09/29/20 1419     Culture/Microbiology No results found for: SDES, SPECREQUEST, CULT, REPTSTATUS  Other culture-see note  Objective: Vitals: Today's Vitals   10/04/20 0416 10/04/20 0526 10/04/20 0622 10/04/20 0810  BP: (!) 163/73  119/64 (!) 97/51  Pulse: (!) 114  93 80  Resp: '19  18 19  '$ Temp: 98.4 F (36.9 C)  98.2 F (36.8 C) 98.5 F (36.9 C)  TempSrc: Oral  Oral Oral  SpO2: 100%  100% 100%  Weight:      Height:      PainSc:  10-Worst pain ever 0-No pain     Intake/Output Summary (Last 24 hours) at 10/04/2020 1115 Last data filed at 10/03/2020 1754 Gross per 24 hour  Intake --  Output 2000 ml  Net -2000 ml   Filed Weights   10/03/20 0406 10/03/20 1415 10/03/20 1800   Weight: 59.5 kg 59.2 kg 57.2 kg   Weight change: -0.3 kg  Intake/Output from previous day: 05/20 0701 - 05/21 0700 In: -  Out: 2000  Intake/Output this shift: No intake/output data recorded. Filed Weights   10/03/20 0406 10/03/20 1415 10/03/20 1800  Weight: 59.5 kg 59.2 kg 57.2 kg    Examination: General exam: AAOx 3, appears older than stated age, weak appearing. HEENT:Oral mucosa moist, Ear/Nose WNL grossly, dentition normal. Respiratory system: bilaterally diminished, no crackles, no use of accessory muscle Cardiovascular system: S1 & S2 +, No JVD,. Gastrointestinal system: Abdomen soft,obese, NT,ND, BS+ Nervous System:Alert, awake, moving extremities and grossly nonfocal Extremities: no edema, distal peripheral pulses palpable.  Skin: No rashes,no icterus. MSK: Thin muscle bulk,tone, power  Data Reviewed: I have personally reviewed following labs and imaging studies CBC: Recent Labs  Lab 09/29/20 1634 10/01/20 0246 10/02/20 0429 10/03/20 0327 10/04/20 0423  WBC 6.6 7.8 8.6 7.5 8.3  HGB 8.4* 7.8* 8.1* 7.7* 8.1*  HCT 26.2* 24.7* 25.3* 24.0* 25.0*  MCV 87.6 88.2 86.9 89.2 92.3  PLT 218 188 192 179 [id number]   Basic Metabolic Panel: Recent Labs  Lab 09/30/20 0426 10/01/20 0246 10/02/20 0429 10/03/20 0327 10/04/20 0423  NA 137 132* 132* 128* 132*  K 3.7 3.7 3.4* 3.6 3.2*  CL 103 98 98 96* 97*  CO2 '28 25 26 25 27  '$ GLUCOSE 99 158* 95 164* 110*  BUN 14 6 <5* 7 <5*  CREATININE 3.75* 2.62* 2.58* 3.90* 2.62*  CALCIUM 8.8* 8.7* 8.8* 8.7* 8.8*  PHOS 3.7 2.7 2.2* 3.1 2.3*   GFR: Estimated Creatinine Clearance: 19 mL/min (A) (by C-G formula based on SCr of 2.62 mg/dL (H)). Liver Function Tests: Recent Labs  Lab 09/30/20 0426 10/01/20 0246 10/02/20 0429 10/03/20 0327 10/04/20 0423  ALBUMIN 2.7* 2.6* 2.9* 2.7* 2.8*   No results for input(s): LIPASE, AMYLASE in the last 168 hours. No  results for input(s): AMMONIA in the last 168 hours. Coagulation Profile: No  results for input(s): INR, PROTIME in the last 168 hours. Cardiac Enzymes: No results for input(s): CKTOTAL, CKMB, CKMBINDEX, TROPONINI in the last 168 hours. BNP (last 3 results) No results for input(s): PROBNP in the last 8760 hours. HbA1C: No results for input(s): HGBA1C in the last 72 hours. CBG: Recent Labs  Lab 10/03/20 1114 10/03/20 1352 10/03/20 1859 10/03/20 2124 10/04/20 0641  GLUCAP 210* 226* 100* 145* 108*   Lipid Profile: No results for input(s): CHOL, HDL, LDLCALC, TRIG, CHOLHDL, LDLDIRECT in the last 72 hours. Thyroid Function Tests: No results for input(s): TSH, T4TOTAL, FREET4, T3FREE, THYROIDAB in the last 72 hours. Anemia Panel: No results for input(s): VITAMINB12, FOLATE, FERRITIN, TIBC, IRON, RETICCTPCT in the last 72 hours. Sepsis Labs: No results for input(s): PROCALCITON, LATICACIDVEN in the last 168 hours.  Recent Results (from the past 240 hour(s))  Resp Panel by RT-PCR (Flu A&B, Covid) Nasopharyngeal Swab     Status: None   Collection Time: 09/26/20  9:27 PM   Specimen: Nasopharyngeal Swab; Nasopharyngeal(NP) swabs in vial transport medium  Result Value Ref Range Status   SARS Coronavirus 2 by RT PCR NEGATIVE NEGATIVE Final    Comment: (NOTE) SARS-CoV-2 target nucleic acids are NOT DETECTED.  The SARS-CoV-2 RNA is generally detectable in upper respiratory specimens during the acute phase of infection. The lowest concentration of SARS-CoV-2 viral copies this assay can detect is 138 copies/mL. A negative result does not preclude SARS-Cov-2 infection and should not be used as the sole basis for treatment or other patient management decisions. A negative result may occur with  improper specimen collection/handling, submission of specimen other than nasopharyngeal swab, presence of viral mutation(s) within the areas targeted by this assay, and inadequate number of viral copies(<138 copies/mL). A negative result must be combined with clinical  observations, patient history, and epidemiological information. The expected result is Negative.  Fact Sheet for Patients:  EntrepreneurPulse.com.au  Fact Sheet for Healthcare Providers:  IncredibleEmployment.be  This test is no t yet approved or cleared by the [place] FDA and  has been authorized for detection and/or diagnosis of SARS-CoV-2 by FDA under an Emergency Use Authorization (EUA). This EUA will remain  in effect (meaning this test can be used) for the duration of the COVID-19 declaration under Section 564(b)(1) of the Act, 21 U.S.C.section 360bbb-3(b)(1), unless the authorization is terminated  or revoked sooner.       Influenza A by PCR NEGATIVE NEGATIVE Final   Influenza B by PCR NEGATIVE NEGATIVE Final    Comment: (NOTE) The Xpert Xpress SARS-CoV-2/FLU/RSV plus assay is intended as an aid in the diagnosis of influenza from Nasopharyngeal swab specimens and should not be used as a sole basis for treatment. Nasal washings and aspirates are unacceptable for Xpert Xpress SARS-CoV-2/FLU/RSV testing.  Fact Sheet for Patients: EntrepreneurPulse.com.au  Fact Sheet for Healthcare Providers: IncredibleEmployment.be  This test is not yet approved or cleared by the [place] FDA and has been authorized for detection and/or diagnosis of SARS-CoV-2 by FDA under an Emergency Use Authorization (EUA). This EUA will remain in effect (meaning this test can be used) for the duration of the COVID-19 declaration under Section 564(b)(1) of the Act, 21 U.S.C. section 360bbb-3(b)(1), unless the authorization is terminated or revoked.  Performed at [facility], [place] [address] [place], [place] [address]      Radiology Studies: CT HEAD WO CONTRAST  Result Date: 10/03/2020 CLINICAL  DATA:  Headache.  History multiple sclerosis EXAM: CT HEAD WITHOUT CONTRAST TECHNIQUE: Contiguous  axial images were obtained from the base of the skull through the vertex without intravenous contrast. COMPARISON:  CT head 06/09/2020.  MRI head 06/09/2020 FINDINGS: Brain: Moderate white matter changes bilaterally similar to prior MRI. Focal hypodensity in the posterior limb internal capsule on the left likely an area of chronic infarction. Mild hypodensity in the pons centrally unchanged from the prior study Ventricle size normal.  No acute infarct, hemorrhage, mass Vascular: Negative for hyperdense vessel Skull: Negative Sinuses/Orbits: Mild mucosal edema paranasal sinuses involving the sphenoid sinus. Bilateral cataract extraction Other: None IMPRESSION: Moderate to extensive white matter hypodensity bilaterally. This is similar to prior CT and MRI. This may represent demyelinating disease given history of multiple sclerosis. Chronic ischemic changes could have a similar appearance. No acute abnormality. Electronically Signed   By: Franchot Gallo M.D.   On: 10/03/2020 20:07   DG Chest Port 1 View  Result Date: 10/03/2020 CLINICAL DATA:  Shortness of breath EXAM: PORTABLE CHEST 1 VIEW COMPARISON:  09/26/2020 FINDINGS: Cardiac shadow is enlarged but stable. Dialysis catheter is now seen in satisfactory position on the right. Lungs are well aerated bilaterally. Resolution of previously seen basilar opacities. No new focal abnormality is noted. IMPRESSION: No acute abnormality seen. Electronically Signed   By: Inez Catalina M.D.   On: 10/03/2020 14:59   DG HIP UNILAT WITH PELVIS 1V RIGHT  Result Date: 10/03/2020 CLINICAL DATA:  Right hip pain. EXAM: DG HIP (WITH OR WITHOUT PELVIS) 1V RIGHT COMPARISON:  None. FINDINGS: There is no evidence of hip fracture or dislocation. There is no evidence of arthropathy or other focal bone abnormality. IMPRESSION: Negative. Electronically Signed   By: Marijo Conception M.D.   On: 10/03/2020 21:09     LOS: 7 days   Antonieta Pert, MD [facility]  10/04/2020, 11:15 AM

## 2020-10-05 DIAGNOSIS — I10 Essential (primary) hypertension: Secondary | ICD-10-CM | POA: Diagnosis not present

## 2020-10-05 LAB — RENAL FUNCTION PANEL
Albumin: 2.4 g/dL — ABNORMAL LOW (ref 3.5–5.0)
Anion gap: 10 (ref 5–15)
BUN: 9 mg/dL (ref 6–20)
CO2: 24 mmol/L (ref 22–32)
Calcium: 8.4 mg/dL — ABNORMAL LOW (ref 8.9–10.3)
Chloride: 95 mmol/L — ABNORMAL LOW (ref 98–111)
Creatinine, Ser: 4.03 mg/dL — ABNORMAL HIGH (ref 0.44–1.00)
GFR, Estimated: 12 mL/min — ABNORMAL LOW (ref >60)
Glucose, Bld: 216 mg/dL — ABNORMAL HIGH (ref 70–99)
Phosphorus: 3 mg/dL (ref 2.5–4.6)
Potassium: 3.3 mmol/L — ABNORMAL LOW (ref 3.5–5.1)
Sodium: 129 mmol/L — ABNORMAL LOW (ref 135–145)

## 2020-10-05 LAB — CBC
HCT: 21.2 % — ABNORMAL LOW (ref 36.0–46.0)
HCT: 21.9 % — ABNORMAL LOW (ref 36.0–46.0)
Hemoglobin: 6.8 g/dL — CL (ref 12.0–15.0)
Hemoglobin: 7 g/dL — ABNORMAL LOW (ref 12.0–15.0)
MCH: 29.6 pg (ref 26.0–34.0)
MCH: 29 pg (ref 26.0–34.0)
MCHC: 32.1 g/dL (ref 30.0–36.0)
MCHC: 32 g/dL (ref 30.0–36.0)
MCV: 92.2 fL (ref 80.0–100.0)
MCV: 90.9 fL (ref 80.0–100.0)
Platelets: 190 10*3/uL (ref 150–400)
Platelets: 181 10*3/uL (ref 150–400)
RBC: 2.3 MIL/uL — ABNORMAL LOW (ref 3.87–5.11)
RBC: 2.41 MIL/uL — ABNORMAL LOW (ref 3.87–5.11)
RDW: 15.2 % (ref 11.5–15.5)
RDW: 15.8 % — ABNORMAL HIGH (ref 11.5–15.5)
WBC: 8.2 10*3/uL (ref 4.0–10.5)
WBC: 8.8 10*3/uL (ref 4.0–10.5)
nRBC: 2.3 % — ABNORMAL HIGH (ref 0.0–0.2)
nRBC: 1.9 % — ABNORMAL HIGH (ref 0.0–0.2)

## 2020-10-05 LAB — PREPARE RBC (CROSSMATCH)

## 2020-10-05 LAB — GLUCOSE, CAPILLARY
Glucose-Capillary: 140 mg/dL — ABNORMAL HIGH (ref 70–99)
Glucose-Capillary: 120 mg/dL — ABNORMAL HIGH (ref 70–99)
Glucose-Capillary: 121 mg/dL — ABNORMAL HIGH (ref 70–99)
Glucose-Capillary: 278 mg/dL — ABNORMAL HIGH (ref 70–99)
Glucose-Capillary: 200 mg/dL — ABNORMAL HIGH (ref 70–99)

## 2020-10-05 LAB — HEMOGLOBIN AND HEMATOCRIT, BLOOD
HCT: 29.7 % — ABNORMAL LOW (ref 36.0–46.0)
Hemoglobin: 9.8 g/dL — ABNORMAL LOW (ref 12.0–15.0)

## 2020-10-05 MED ORDER — SODIUM CHLORIDE 0.9% IV SOLUTION: Freq: Once | INTRAVENOUS | Status: AC

## 2020-10-05 MED ORDER — DARBEPOETIN ALFA 200 MCG/0.4ML IJ SOSY
200.0000 ug | PREFILLED_SYRINGE | INTRAMUSCULAR | Status: DC
  Administered 2020-10-06: 200 ug via INTRAVENOUS
  Filled 2020-10-05 (×3): qty 0.4

## 2020-10-05 MED ORDER — DIPHENHYDRAMINE HCL 25 MG PO CAPS
25.0000 mg | ORAL_CAPSULE | Freq: Four times a day (QID) | ORAL | Status: DC | PRN
  Administered 2020-10-05 – 2020-10-18 (×10): 25 mg via ORAL
  Filled 2020-10-05 (×12): qty 1

## 2020-10-05 MED ORDER — DICLOFENAC SODIUM 1 % EX GEL
2.0000 g | Freq: Four times a day (QID) | CUTANEOUS | Status: DC
  Administered 2020-10-05 – 2020-10-18 (×45): 2 g via TOPICAL
  Filled 2020-10-05: qty 100

## 2020-10-05 MED ORDER — MORPHINE SULFATE (PF) 2 MG/ML IV SOLN
2.0000 mg | Freq: Once | INTRAVENOUS | Status: AC
  Administered 2020-10-05: 2 mg via INTRAVENOUS
  Filled 2020-10-05: qty 1

## 2020-10-05 MED ORDER — HYDROCODONE-ACETAMINOPHEN 5-325 MG PO TABS
1.0000 | ORAL_TABLET | Freq: Once | ORAL | Status: AC
  Administered 2020-10-05: 1 via ORAL

## 2020-10-05 NOTE — Progress Notes (Signed)
PROGRESS NOTE    Hannah [name]  [id number] DOB: 1966-04-10 DOA: 09/26/2020 PCP: Vernie Shanks, MD   Chief Complaint  Patient presents with  . Leg Swelling  Brief Narrative: 55 year old female with CKD 5, anemia, CVA, hypertension, diabetes, hyperlipidemia, multiple sclerosis presents with shortness of breath, lower extremity swelling, onset 2 to 3 days prior to admission.  Patient was seen by PCP and sent to the ED for evaluation.  Recently admitted 2 weeks ago for AKI on CKD 4-renal ultrasound was unremarkable believed to be progression of her CKD, her blood pressure medication were adjusted and was planning for nephrology outpatient follow-up. She was seen in the ED creatinine 5.3 similar to previous with anemia elevated BNP DVT study was negative chest x-ray showed ill-defined opacity consistent with pneumonia in the right middle and lower lobes and some atelectasis.  Nephrology was consulted with her volume overload likely approaching ESRD, patient was admitted for further management. Seen by nephrology started on hemodialysis-now on Monday Wednesday Friday schedule. Outpatient dialysis ha been set up starting 5/23 at TCU.  Subjective: Seen and examined this morning. Daughter at the bedside. Overnight hemoglobin dropped to 6.8 gm but on recheck was 7.0 Afebrile overnight blood pressure stable saturating well on room air Slept very well after pain medication and is still sleepy this morning  Assessment & Plan: Longstanding CKD that has progressed to ESRD w/ Volume overload Uremia/metabolic acidosis: Now on HD MWF, status post aVF 5/19.  Outpatient dialysis has been arranged starting from Monday at [facility].  No nausea or vomiting.  Fluid status is stable.  Electrolytes are stable, nephrology following closely and appreciate input Recent Labs  Lab 10/01/20 0246 10/02/20 0429 10/03/20 0327 10/04/20 0423 10/05/20 0239  BUN 6 <5* 7 <5* 9  CREATININE 2.62* 2.58* 3.90* 2.62* 4.03*    Essential hypertension, BP is well controlled has been fluctuating.she is on multiple medication - amlodipine 10 mg carvedilol 6.25 mg  Hydralazine 50 mg.  Monitor  Type 2 diabetes mellitus hemoglobin A1c 8.7 most recently.  Blood sugar is fairly controlled continue Lantus 10 unit nightly while here can resume her home Tresiba at lower dose, was also taking insulin 10 units 3 times daily at home ( may need to lower it as he has not been needing) - discontinue glipizide. Recent Labs  Lab 10/04/20 0641 10/04/20 1115 10/04/20 1605 10/04/20 1940 10/05/20 0459  GLUCAP 108* 196* 189* 286* 140*   History of  Recent CVA in jan 2022 w/ rt sided spastic weakness/Hyperlipidemia: Continue on Crestor and Plavix .based on previous documentation she was having dysarthric speech since jan 2022.  Some speech issues per daughter for past few days from pain and mostly as she cannont catch her breath.  Her lungs are clear/cxr normal.CT head shows moderate extensive white matter hypodensity bilaterally similar to prior CT and MRI may represent demyelinating disease with history of multiple sclerosis.  She will need follow-up with neurology as outpatient as planned before.  Anemia of chronic disease/renal disease ,managed by ferric gluconate,ESA by nephrology.  Hemoglobin on lower side will benefit with blood transfusion-discussed with the patient and daughter agree with 1 unit PRBC transfusion will order today if okay with nephrology.  Can repeat H&H again tomorrow before dialysis. Recent Labs  Lab 10/02/20 0429 10/03/20 0327 10/04/20 0423 10/05/20 0239 10/05/20 0626  HGB 8.1* 7.7* 8.1* 6.8* 7.0*  HCT 25.3* 24.0* 25.0* 21.2* 21.9*   Multiple sclerosis:??MS for years as per daughter patient was followed supposed to  have a follow-up with Dr. Leonie Man from last discharge back in February but it appears he has not followed up.  I made an internal referral again to neurology Dr Leonie Man. CT head this admission shows  similar white matter hypodensity bilaterally.  Right-sided pain and back and leg/with right-sided stroke questionable MS: X-ray of right hip no acute finding.  Added Neurontin low-dose-somewhat sleepy we will discontinue her tizanidine, also on Norco-monitor closely may need to cut down the dose.she will need close follow-up with neurology PCP. Cont PTOT/  Diet Order            Diet renal/carb modified with fluid restriction Diet-HS Snack? Nothing; Fluid restriction: 1200 mL Fluid; Room service appropriate? Yes; Fluid consistency: Thin  Diet effective now               Patient's Body mass index is 23.06 kg/m. DVT prophylaxis: heparin injection 5,000 Units Start: 09/30/20 0600 Code Status:   Code Status: Full Code  Family Communication: plan of care discussed with patient at bedside. Called and discussed with patient's daughter over the phone 5/20. I spoke with patient's daughter Melvenia Beam [id number], and again at the bedside.  Status is: Inpatient. Remains inpatient appropriate because:IV treatments appropriate due to intensity of illness or inability to take PO and Inpatient level of care appropriate due to severity of illness  Dispo: The patient is from: Home lives with the daughter             Anticipated d/c is to: Home tomorrow likely after dialysis              Patient currently is not medically stable to d/c.   Difficult to place patient No  Unresulted Labs (From admission, onward)          Start     Ordered   10/06/20 0500  CBC  Daily,   R      10/05/20 0816   10/05/20 0338  Type and screen [place]  Once,   R       Comments: [place]    10/05/20 [id number]   09/28/20 0500  Renal function panel  Daily,   R      09/27/20 1218   Signed and Held  Hepatitis B surface antigen  (New Patient [facility] (Hepatitis B))  Once,   R        Signed and Held   Signed and Held  Hepatitis B surface antibody  (New Patient [facility] (Hepatitis B))  Once,    R        Signed and Held   Signed and Held  Hepatitis B core antibody, total  (New Patient [facility] (Hepatitis B))  Once,   R        Signed and Held   Signed and Held  Hepatitis C antibody  (New Patient [facility] Labs (Hepatitis B))  Once,   R        Signed and Held         Medications reviewed:  Scheduled Meds: . amLODipine  10 mg Oral Daily  . carvedilol  6.25 mg Oral BID WC  . Chlorhexidine Gluconate Cloth  6 each Topical Q0600  . clopidogrel  75 mg Oral Daily  . [START ON 10/06/2020] darbepoetin (ARANESP) injection - DIALYSIS  200 mcg Intravenous Q Mon-HD  . famotidine  20 mg Oral Daily  . FLUoxetine  20 mg Oral Daily  . gabapentin  100 mg Oral TID  .  heparin  5,000 Units Subcutaneous Q8H  . hydrALAZINE  50 mg Oral Q8H  . insulin aspart  0-6 Units Subcutaneous TID WC  . insulin glargine  10 Units Subcutaneous QHS  . melatonin  3 mg Oral QHS  . metoCLOPramide  5 mg Oral BID  . rosuvastatin  10 mg Oral Daily  . sodium chloride flush  3 mL Intravenous Q12H  . tiZANidine  2 mg Oral Q8H   Continuous Infusions: . sodium chloride Stopped (09/28/20 1354)    Consultants:see note  Procedures:see note  Antimicrobials: Anti-infectives (From admission, onward)   Start     Dose/Rate Route Frequency Ordered Stop   10/02/20 1049  ceFAZolin (ANCEF) 2-4 GM/100ML-% IVPB       Note to Pharmacy: Claybon Jabs   : cabinet override      10/02/20 1049 10/02/20 2259   09/29/20 1330  ceFAZolin (ANCEF) 2-4 GM/100ML-% IVPB       Note to Pharmacy: Cecile Sheerer   : cabinet override      09/29/20 1330 09/30/20 0144   09/29/20 0000  ceFAZolin (ANCEF) 1 g in sodium chloride 0.9 % 100 mL IVPB        1 g 200 mL/hr over 30 Minutes Intravenous To Radiology 09/27/20 1457 09/29/20 1419     Culture/Microbiology No results found for: SDES, SPECREQUEST, CULT, REPTSTATUS  Other culture-see note  Objective: Vitals: Today's Vitals   10/04/20 1730 10/04/20 1927 10/04/20 2115 10/05/20 0500  BP:  (!)  133/59  128/60  Pulse:  90  80  Resp:  16  16  Temp:  98.1 F (36.7 C)  98.4 F (36.9 C)  TempSrc:  Oral  Oral  SpO2:  98%  100%  Weight:      Height:      PainSc: 9   8      Intake/Output Summary (Last 24 hours) at 10/05/2020 0816 Last data filed at 10/05/2020 0500 Gross per 24 hour  Intake 323 ml  Output 400 ml  Net -77 ml   Filed Weights   10/03/20 0406 10/03/20 1415 10/03/20 1800  Weight: 59.5 kg 59.2 kg 57.2 kg   Weight change:   Intake/Output from previous day: 05/21 0701 - 05/22 0700 In: 323 [P.O.:320; I.V.:3] Out: 400 [Urine:400] Intake/Output this shift: No intake/output data recorded. Filed Weights   10/03/20 0406 10/03/20 1415 10/03/20 1800  Weight: 59.5 kg 59.2 kg 57.2 kg    Examination: General exam: AAOx oriented but somewhat sleepy, answers questions appropriately HEENT:Oral mucosa moist, Ear/Nose WNL grossly, dentition normal. Respiratory system: bilaterally clear, no use of accessory muscle Cardiovascular system: S1 & S2 +, No JVD,. Gastrointestinal system: Abdomen soft,NT,ND, BS+ Nervous System:Alert, awake, mildly sleepy, moving all her extremities well nonfocal  Extremities: no edema, distal peripheral pulses palpable.  Skin: No rashes,no icterus. MSK: Normal muscle bulk,tone, power  Data Reviewed: I have personally reviewed following labs and imaging studies CBC: Recent Labs  Lab 10/02/20 0429 10/03/20 0327 10/04/20 0423 10/05/20 0239 10/05/20 0626  WBC 8.6 7.5 8.3 8.2 8.8  HGB 8.1* 7.7* 8.1* 6.8* 7.0*  HCT 25.3* 24.0* 25.0* 21.2* 21.9*  MCV 86.9 89.2 92.3 92.2 90.9  PLT 192 179 199 190 0000000   Basic Metabolic Panel: Recent Labs  Lab 10/01/20 0246 10/02/20 0429 10/03/20 0327 10/04/20 0423 10/05/20 0239  NA 132* 132* 128* 132* 129*  K 3.7 3.4* 3.6 3.2* 3.3*  CL 98 98 96* 97* 95*  CO2 '25 26 25 27 24  '$ GLUCOSE 158*  95 164* 110* 216*  BUN 6 <5* 7 <5* 9  CREATININE 2.62* 2.58* 3.90* 2.62* 4.03*  CALCIUM 8.7* 8.8* 8.7* 8.8* 8.4*   PHOS 2.7 2.2* 3.1 2.3* 3.0   GFR: Estimated Creatinine Clearance: 12.3 mL/min (A) (by C-G formula based on SCr of 4.03 mg/dL (H)). Liver Function Tests: Recent Labs  Lab 10/01/20 0246 10/02/20 0429 10/03/20 0327 10/04/20 0423 10/05/20 0239  ALBUMIN 2.6* 2.9* 2.7* 2.8* 2.4*   No results for input(s): LIPASE, AMYLASE in the last 168 hours. No results for input(s): AMMONIA in the last 168 hours. Coagulation Profile: No results for input(s): INR, PROTIME in the last 168 hours. Cardiac Enzymes: No results for input(s): CKTOTAL, CKMB, CKMBINDEX, TROPONINI in the last 168 hours. BNP (last 3 results) No results for input(s): PROBNP in the last 8760 hours. HbA1C: No results for input(s): HGBA1C in the last 72 hours. CBG: Recent Labs  Lab 10/04/20 0641 10/04/20 1115 10/04/20 1605 10/04/20 1940 10/05/20 0459  GLUCAP 108* 196* 189* 286* 140*   Lipid Profile: No results for input(s): CHOL, HDL, LDLCALC, TRIG, CHOLHDL, LDLDIRECT in the last 72 hours. Thyroid Function Tests: No results for input(s): TSH, T4TOTAL, FREET4, T3FREE, THYROIDAB in the last 72 hours. Anemia Panel: No results for input(s): VITAMINB12, FOLATE, FERRITIN, TIBC, IRON, RETICCTPCT in the last 72 hours. Sepsis Labs: No results for input(s): PROCALCITON, LATICACIDVEN in the last 168 hours.  Recent Results (from the past 240 hour(s))  Resp Panel by RT-PCR (Flu A&B, Covid) Nasopharyngeal Swab     Status: None   Collection Time: 09/26/20  9:27 PM   Specimen: Nasopharyngeal Swab; Nasopharyngeal(NP) swabs in vial transport medium  Result Value Ref Range Status   SARS Coronavirus 2 by RT PCR NEGATIVE NEGATIVE Final    Comment: (NOTE) SARS-CoV-2 target nucleic acids are NOT DETECTED.  The SARS-CoV-2 RNA is generally detectable in upper respiratory specimens during the acute phase of infection. The lowest concentration of SARS-CoV-2 viral copies this assay can detect is 138 copies/mL. A negative result does not  preclude SARS-Cov-2 infection and should not be used as the sole basis for treatment or other patient management decisions. A negative result may occur with  improper specimen collection/handling, submission of specimen other than nasopharyngeal swab, presence of viral mutation(s) within the areas targeted by this assay, and inadequate number of viral copies(<138 copies/mL). A negative result must be combined with clinical observations, patient history, and epidemiological information. The expected result is Negative.  Fact Sheet for Patients:  EntrepreneurPulse.com.au  Fact Sheet for Healthcare Providers:  IncredibleEmployment.be  This test is no t yet approved or cleared by the [place] FDA and  has been authorized for detection and/or diagnosis of SARS-CoV-2 by FDA under an Emergency Use Authorization (EUA). This EUA will remain  in effect (meaning this test can be used) for the duration of the COVID-19 declaration under Section 564(b)(1) of the Act, 21 U.S.C.section 360bbb-3(b)(1), unless the authorization is terminated  or revoked sooner.       Influenza A by PCR NEGATIVE NEGATIVE Final   Influenza B by PCR NEGATIVE NEGATIVE Final    Comment: (NOTE) The Xpert Xpress SARS-CoV-2/FLU/RSV plus assay is intended as an aid in the diagnosis of influenza from Nasopharyngeal swab specimens and should not be used as a sole basis for treatment. Nasal washings and aspirates are unacceptable for Xpert Xpress SARS-CoV-2/FLU/RSV testing.  Fact Sheet for Patients: EntrepreneurPulse.com.au  Fact Sheet for Healthcare Providers: IncredibleEmployment.be  This test is not yet approved or cleared  by the [place] and has been authorized for detection and/or diagnosis of SARS-CoV-2 by FDA under an Emergency Use Authorization (EUA). This EUA will remain in effect (meaning this test can be used) for the  duration of the COVID-19 declaration under Section 564(b)(1) of the Act, 21 U.S.C. section 360bbb-3(b)(1), unless the authorization is terminated or revoked.  Performed at [facility], [place] [address] [place], [place] [address]      Radiology Studies: CT HEAD WO CONTRAST  Result Date: 10/03/2020 CLINICAL DATA:  Headache.  History multiple sclerosis EXAM: CT HEAD WITHOUT CONTRAST TECHNIQUE: Contiguous axial images were obtained from the base of the skull through the vertex without intravenous contrast. COMPARISON:  CT head 06/09/2020.  MRI head 06/09/2020 FINDINGS: Brain: Moderate white matter changes bilaterally similar to prior MRI. Focal hypodensity in the posterior limb internal capsule on the left likely an area of chronic infarction. Mild hypodensity in the pons centrally unchanged from the prior study Ventricle size normal.  No acute infarct, hemorrhage, mass Vascular: Negative for hyperdense vessel Skull: Negative Sinuses/Orbits: Mild mucosal edema paranasal sinuses involving the sphenoid sinus. Bilateral cataract extraction Other: None IMPRESSION: Moderate to extensive white matter hypodensity bilaterally. This is similar to prior CT and MRI. This may represent demyelinating disease given history of multiple sclerosis. Chronic ischemic changes could have a similar appearance. No acute abnormality. Electronically Signed   By: Franchot Gallo M.D.   On: 10/03/2020 20:07   DG Chest Port 1 View  Result Date: 10/03/2020 CLINICAL DATA:  Shortness of breath EXAM: PORTABLE CHEST 1 VIEW COMPARISON:  09/26/2020 FINDINGS: Cardiac shadow is enlarged but stable. Dialysis catheter is now seen in satisfactory position on the right. Lungs are well aerated bilaterally. Resolution of previously seen basilar opacities. No new focal abnormality is noted. IMPRESSION: No acute abnormality seen. Electronically Signed   By: Inez Catalina M.D.   On: 10/03/2020 14:59   DG HIP UNILAT WITH PELVIS 1V  RIGHT  Result Date: 10/03/2020 CLINICAL DATA:  Right hip pain. EXAM: DG HIP (WITH OR WITHOUT PELVIS) 1V RIGHT COMPARISON:  None. FINDINGS: There is no evidence of hip fracture or dislocation. There is no evidence of arthropathy or other focal bone abnormality. IMPRESSION: Negative. Electronically Signed   By: Marijo Conception M.D.   On: 10/03/2020 21:09     LOS: 8 days   Antonieta Pert, MD [facility]  10/05/2020, 8:16 AM

## 2020-10-05 NOTE — Progress Notes (Signed)
Date and time results received: 10/05/20 0325  Test: Hemaglobin Critical Value: 6.8  Name of Provider Notified: Dr. Kennon Holter  Orders Received? Or Actions Taken?: No orders received nor actions taken at this time

## 2020-10-05 NOTE — Progress Notes (Signed)
Subjective: Patient feels well today with no complaints.  Hemoglobin 6.8  Objective Vital signs in last 24 hours: Vitals:   10/04/20 1727 10/04/20 1927 10/05/20 0500 10/05/20 0837  BP: (!) 132/57 (!) 133/59 128/60 (!) 104/53  Pulse: 87 90 80 70  Resp: '18 16 16 16  '$ Temp:  98.1 F (36.7 C) 98.4 F (36.9 C) 98.3 F (36.8 C)  TempSrc:  Oral Oral Oral  SpO2: 100% 98% 100% 99%  Weight:      Height:       Weight change:   Intake/Output Summary (Last 24 hours) at 10/05/2020 0948 Last data filed at 10/05/2020 0500 Gross per 24 hour  Intake 323 ml  Output 400 ml  Net -77 ml    Assessment/Plan: [name] with DM and HTN with progressive CKD and now what appears to be uremic symptoms.  My recommendation would be to initiate dialysis this admission 1.Renal- advanced CKD that has been longstanding now felt to be ESRD-  Has now progressed to the point where she is uremic and deemed to be ESRD needing to start dialysis.   S/p AVF 5/19; appreciate help from [facility].  Continue dialysis on MWF schedule while inpatient however she is stable to discharge from nephrology standpoint.  She is arranged to start on Monday at [facility] 2. Hypertension/volume  -blood pressure overall improved with volume removal on dialysis as well as adjustment of medications 3. Anemia  -  iron stores low, s/p repletion w/ 1g of iron and now on ESA 174mg weekly. Increase to 2042m weekly with next dose. Likely needs transfusion today; defer to primary 4. Bones-  PTH 84. No treatment needed 5. Acidosis- Resolved    SaReesa Chew  Labs: Basic Metabolic Panel: Recent Labs  Lab 10/03/20 0327 10/04/20 0423 10/05/20 0239  NA 128* 132* 129*  K 3.6 3.2* 3.3*  CL 96* 97* 95*  CO2 '25 27 24  '$ GLUCOSE 164* 110* 216*  BUN 7 <5* 9  CREATININE 3.90* 2.62* 4.03*  CALCIUM 8.7* 8.8* 8.4*  PHOS 3.1 2.3* 3.0   Liver Function Tests: Recent Labs  Lab 10/03/20 0327 10/04/20 0423 10/05/20 0239  ALBUMIN 2.7* 2.8* 2.4*    No results for input(s): LIPASE, AMYLASE in the last 168 hours. No results for input(s): AMMONIA in the last 168 hours. CBC: Recent Labs  Lab 10/02/20 0429 10/03/20 0327 10/04/20 0423 10/05/20 0239 10/05/20 0626  WBC 8.6 7.5 8.3 8.2 8.8  HGB 8.1* 7.7* 8.1* 6.8* 7.0*  HCT 25.3* 24.0* 25.0* 21.2* 21.9*  MCV 86.9 89.2 92.3 92.2 90.9  PLT 192 179 199 190 181   Cardiac Enzymes: No results for input(s): CKTOTAL, CKMB, CKMBINDEX, TROPONINI in the last 168 hours. CBG: Recent Labs  Lab 10/04/20 1115 10/04/20 1605 10/04/20 1940 10/05/20 0459 10/05/20 0839  GLUCAP 196* 189* 286* 140* 120*    Iron Studies:  No results for input(s): IRON, TIBC, TRANSFERRIN, FERRITIN in the last 72 hours. Studies/Results: CT HEAD WO CONTRAST  Result Date: 10/03/2020 CLINICAL DATA:  Headache.  History multiple sclerosis EXAM: CT HEAD WITHOUT CONTRAST TECHNIQUE: Contiguous axial images were obtained from the base of the skull through the vertex without intravenous contrast. COMPARISON:  CT head 06/09/2020.  MRI head 06/09/2020 FINDINGS: Brain: Moderate white matter changes bilaterally similar to prior MRI. Focal hypodensity in the posterior limb internal capsule on the left likely an area of chronic infarction. Mild hypodensity in the pons centrally unchanged from the prior study Ventricle size  normal.  No acute infarct, hemorrhage, mass Vascular: Negative for hyperdense vessel Skull: Negative Sinuses/Orbits: Mild mucosal edema paranasal sinuses involving the sphenoid sinus. Bilateral cataract extraction Other: None IMPRESSION: Moderate to extensive white matter hypodensity bilaterally. This is similar to prior CT and MRI. This may represent demyelinating disease given history of multiple sclerosis. Chronic ischemic changes could have a similar appearance. No acute abnormality. Electronically Signed   By: Franchot Gallo M.D.   On: 10/03/2020 20:07   DG Chest Port 1 View  Result Date: 10/03/2020 CLINICAL  DATA:  Shortness of breath EXAM: PORTABLE CHEST 1 VIEW COMPARISON:  09/26/2020 FINDINGS: Cardiac shadow is enlarged but stable. Dialysis catheter is now seen in satisfactory position on the right. Lungs are well aerated bilaterally. Resolution of previously seen basilar opacities. No new focal abnormality is noted. IMPRESSION: No acute abnormality seen. Electronically Signed   By: Inez Catalina M.D.   On: 10/03/2020 14:59   DG HIP UNILAT WITH PELVIS 1V RIGHT  Result Date: 10/03/2020 CLINICAL DATA:  Right hip pain. EXAM: DG HIP (WITH OR WITHOUT PELVIS) 1V RIGHT COMPARISON:  None. FINDINGS: There is no evidence of hip fracture or dislocation. There is no evidence of arthropathy or other focal bone abnormality. IMPRESSION: Negative. Electronically Signed   By: Marijo Conception M.D.   On: 10/03/2020 21:09   Medications: Infusions: . sodium chloride Stopped (09/28/20 1354)    Scheduled Medications: . amLODipine  10 mg Oral Daily  . carvedilol  6.25 mg Oral BID WC  . Chlorhexidine Gluconate Cloth  6 each Topical Q0600  . clopidogrel  75 mg Oral Daily  . [START ON 10/06/2020] darbepoetin (ARANESP) injection - DIALYSIS  200 mcg Intravenous Q Mon-HD  . famotidine  20 mg Oral Daily  . FLUoxetine  20 mg Oral Daily  . gabapentin  100 mg Oral TID  . heparin  5,000 Units Subcutaneous Q8H  . hydrALAZINE  50 mg Oral Q8H  . insulin aspart  0-6 Units Subcutaneous TID WC  . insulin glargine  10 Units Subcutaneous QHS  . melatonin  3 mg Oral QHS  . metoCLOPramide  5 mg Oral BID  . rosuvastatin  10 mg Oral Daily  . sodium chloride flush  3 mL Intravenous Q12H  . tiZANidine  2 mg Oral Q8H    have reviewed scheduled and prn medications.\  Vitals:   10/05/20 0500 10/05/20 0837  BP: 128/60 (!) 104/53  Pulse: 80 70  Resp: 16 16  Temp: 98.4 F (36.9 C) 98.3 F (36.8 C)  SpO2: 100% 99%     Physical Exam: General:  NAD-sitting in bed Heart: Normal rate Lungs: No increased work of breathing, bilateral  chest rise Abdomen: soft, non tender Extremities: Warm and well perfused    10/05/2020,9:48 AM  LOS: 8 days

## 2020-10-06 ENCOUNTER — Inpatient Hospital Stay (HOSPITAL_COMMUNITY): Payer: Medicare Other

## 2020-10-06 DIAGNOSIS — I10 Essential (primary) hypertension: Secondary | ICD-10-CM | POA: Diagnosis not present

## 2020-10-06 LAB — TYPE AND SCREEN
ABO/RH(D): A POS
Antibody Screen: POSITIVE
DAT, IgG: NEGATIVE
Donor AG Type: NEGATIVE
Unit division: 0

## 2020-10-06 LAB — CBC
HCT: 27.7 % — ABNORMAL LOW (ref 36.0–46.0)
Hemoglobin: 9.1 g/dL — ABNORMAL LOW (ref 12.0–15.0)
MCH: 29.8 pg (ref 26.0–34.0)
MCHC: 32.9 g/dL (ref 30.0–36.0)
MCV: 90.8 fL (ref 80.0–100.0)
Platelets: 201 10*3/uL (ref 150–400)
RBC: 3.05 MIL/uL — ABNORMAL LOW (ref 3.87–5.11)
RDW: 16.5 % — ABNORMAL HIGH (ref 11.5–15.5)
WBC: 10.2 10*3/uL (ref 4.0–10.5)
nRBC: 0.9 % — ABNORMAL HIGH (ref 0.0–0.2)

## 2020-10-06 LAB — RENAL FUNCTION PANEL
Albumin: 2.8 g/dL — ABNORMAL LOW (ref 3.5–5.0)
Anion gap: 12 (ref 5–15)
BUN: 17 mg/dL (ref 6–20)
CO2: 23 mmol/L (ref 22–32)
Calcium: 8.6 mg/dL — ABNORMAL LOW (ref 8.9–10.3)
Chloride: 93 mmol/L — ABNORMAL LOW (ref 98–111)
Creatinine, Ser: 5.35 mg/dL — ABNORMAL HIGH (ref 0.44–1.00)
GFR, Estimated: 9 mL/min — ABNORMAL LOW (ref >60)
Glucose, Bld: 165 mg/dL — ABNORMAL HIGH (ref 70–99)
Phosphorus: 3.5 mg/dL (ref 2.5–4.6)
Potassium: 3.2 mmol/L — ABNORMAL LOW (ref 3.5–5.1)
Sodium: 128 mmol/L — ABNORMAL LOW (ref 135–145)

## 2020-10-06 LAB — GLUCOSE, CAPILLARY
Glucose-Capillary: 141 mg/dL — ABNORMAL HIGH (ref 70–99)
Glucose-Capillary: 132 mg/dL — ABNORMAL HIGH (ref 70–99)
Glucose-Capillary: 171 mg/dL — ABNORMAL HIGH (ref 70–99)
Glucose-Capillary: 185 mg/dL — ABNORMAL HIGH (ref 70–99)
Glucose-Capillary: 237 mg/dL — ABNORMAL HIGH (ref 70–99)

## 2020-10-06 LAB — HEMOGLOBIN AND HEMATOCRIT, BLOOD
HCT: 28 % — ABNORMAL LOW (ref 36.0–46.0)
Hemoglobin: 9.3 g/dL — ABNORMAL LOW (ref 12.0–15.0)

## 2020-10-06 LAB — BPAM RBC
Blood Product Expiration Date: 202206022359
ISSUE DATE / TIME: 202205221432
Unit Type and Rh: 6200

## 2020-10-06 MED ORDER — HYDROCODONE-ACETAMINOPHEN 5-325 MG PO TABS
1.0000 | ORAL_TABLET | ORAL | Status: DC | PRN
  Administered 2020-10-06 – 2020-10-07 (×4): 1 via ORAL
  Filled 2020-10-06 (×5): qty 1

## 2020-10-06 MED ORDER — HYDROMORPHONE HCL 1 MG/ML IJ SOLN
0.5000 mg | Freq: Four times a day (QID) | INTRAMUSCULAR | Status: DC | PRN
  Administered 2020-10-06 – 2020-10-12 (×18): 0.5 mg via INTRAVENOUS
  Filled 2020-10-06 (×17): qty 0.5

## 2020-10-06 MED ORDER — HYDROMORPHONE HCL 1 MG/ML IJ SOLN
1.0000 mg | Freq: Once | INTRAMUSCULAR | Status: AC
  Administered 2020-10-06: 1 mg via INTRAVENOUS
  Filled 2020-10-06: qty 1

## 2020-10-06 MED ORDER — HYDROMORPHONE HCL 1 MG/ML IJ SOLN
INTRAMUSCULAR | Status: AC
  Filled 2020-10-06: qty 0.5

## 2020-10-06 MED ORDER — DARBEPOETIN ALFA 40 MCG/0.4ML IJ SOSY
PREFILLED_SYRINGE | INTRAMUSCULAR | Status: AC
  Filled 2020-10-06: qty 0.4

## 2020-10-06 MED ORDER — METHOCARBAMOL 500 MG PO TABS
500.0000 mg | ORAL_TABLET | Freq: Four times a day (QID) | ORAL | Status: DC | PRN
  Administered 2020-10-06 – 2020-10-14 (×13): 500 mg via ORAL
  Filled 2020-10-06 (×13): qty 1

## 2020-10-06 MED ORDER — HEPARIN SODIUM (PORCINE) 1000 UNIT/ML IJ SOLN
INTRAMUSCULAR | Status: AC
  Filled 2020-10-06: qty 1

## 2020-10-06 MED ORDER — MORPHINE SULFATE (PF) 2 MG/ML IV SOLN
INTRAVENOUS | Status: AC
  Filled 2020-10-06: qty 1

## 2020-10-06 MED ORDER — CHLORHEXIDINE GLUCONATE CLOTH 2 % EX PADS
6.0000 | MEDICATED_PAD | Freq: Every day | CUTANEOUS | Status: DC
  Administered 2020-10-06 – 2020-10-11 (×3): 6 via TOPICAL

## 2020-10-06 NOTE — Progress Notes (Signed)
Renal Navigator appreciates update from Nephrologist/Dr. Royce Macadamia in regards to conversation with patient/daughter today. She states patient's daughter is concerned about her mother's level of weakness and inability to get out of bed/walk at this point. She is unsure about her ability to take her mother home to care for her immediately following this hospitalization.  Navigator reached out to PT/R. Stout and TOC CSW/R. Bland to alert to this concern, as patient's outpatient HD clinic will need to be changed if patient is discharged to SNF. At this point, TCU may be a burden for patient in her condition, rather than a support (added information and 4x per week).  Navigator notes PT note today recommends SNF and family is supportive of the idea.  Navigator initiated change in referral from TCU to [facility] to see if they are able to accommodate patient, as this is the closest clinic to her home.  Navigator will follow closely and meet with patient/daughter to follow up.  Alphonzo Cruise, [place] Renal Navigator [phone]

## 2020-10-06 NOTE — Progress Notes (Signed)
Bedside shift report complete. Received patient awake,alert.orientedx4 and able to verbalize needs. NAD noted; respirations easy on room air. Tele monitor on. Movement/sensation noted to all extremities. SCDs on to b/ lower extremities. RIJ HD catheter noted. Whiteboard updated. All safety measures in place and personal belongings within reach.

## 2020-10-06 NOTE — Progress Notes (Signed)
PROGRESS NOTE    Hannah [name] [name]  [id number] DOB: 1965-11-09 DOA: 09/26/2020 PCP: Hannah [name] Shanks, MD   Chief Complaint  Patient presents with  . Leg Swelling  Brief Narrative: 55 year old female with CKD 5, anemia, CVA, hypertension, diabetes, hyperlipidemia, multiple sclerosis presents with shortness of breath, lower extremity swelling, onset 2 to 3 days prior to admission.  Patient was seen by PCP and sent to the ED for evaluation.  Recently admitted 2 weeks ago for AKI on CKD 4-renal ultrasound was unremarkable believed to be progression of her CKD, her blood pressure medication were adjusted and was planning for nephrology outpatient follow-up. She was seen in the ED creatinine 5.3 similar to previous with anemia elevated BNP DVT study was negative chest x-ray showed ill-defined opacity consistent with pneumonia in the right middle and lower lobes and some atelectasis.  Nephrology was consulted with her volume overload likely approaching ESRD, patient was admitted for further management. Seen by nephrology started on hemodialysis-now on Monday Wednesday Friday schedule. Outpatient dialysis ha been set up starting 5/23 at TCU.  Subjective: Today complains of more pain from right flank to right groin and right hip  Using heat pad.Overnight needed IV Dilaudid Daughter at the bedside. Hemoglobin after 1 unit transfusion has stabilized  Assessment & Plan:  Longstanding CKD that has progressed to ESRD w/ Volume overload Uremia/metabolic acidosis: Now on HD MWF, status post aVF 5/19.  Outpatient dialysis has been arranged from 10/06/20 at TCU.  Nephrology following fluid status electrolytes stable with low potassium.  For dialysis today.  Recent Labs  Lab 10/02/20 0429 10/03/20 0327 10/04/20 0423 10/05/20 0239 10/06/20 0246  BUN <5* 7 <5* 9 17  CREATININE 2.58* 3.90* 2.62* 4.03* 5.35*   Essential hypertension,BP is fairly stable.On multiple meds amlodipine 10 mg carvedilol 6.25 mg   Hydralazine 50 mg.  Monitor.  Hypokalemia addressed with dialysis.  Type 2 diabetes mellitus hemoglobin A1c 8.7 most recently.  Sugar well controlled on Lantus 10 units and sliding scale. . At home on Tresiba  And also insulin 10 units 3 times daily. Home glipizide discontinued. Recent Labs  Lab 10/05/20 1150 10/05/20 1639 10/05/20 2058 10/06/20 0754 10/06/20 1210  GLUCAP 121* 278* 200* 141* 132*   History of  Recent CVA in jan 2022 w/ Rt sided spastic weakness/Hyperlipidemia:Continue on Crestor.Plavix was resumed after fistula surgery but holding 10/06/20-due to psoas hematoma and anemia. Based on previous documentation she was having dysarthric speech since jan 2022. CT head this admission shows moderate extensive white matter hypodensity bilaterally similar to prior CT and MRI may represent demyelinating disease with history of multiple sclerosis.She will need follow-up with neurology as outpatient as planned before-this was discussed with patient's daughter.  Acute blood loss anemia needing transfusion Anemia of chronic disease/renal disease,managed by ferric gluconate,ESA by nephrology. Hb dropped to 6.8 g 5/22-transfuse 1 unit PRBC and increased more than appropriately. Suspect patient had acute blood loss anemia from a spontaneous right psoas muscle hematoma. I discussed with vascular surgery, if patient has further drop in hemoglobin can consider CT scan with IV contrast although would not change managemen (currently IV contrast in shortage).Will hold Plavix and heparin check serial H&H. Recent Labs  Lab 10/04/20 0423 10/05/20 0239 10/05/20 0626 10/05/20 2231 10/06/20 0246  HGB 8.1* 6.8* 7.0* 9.8* 9.1*  HCT 25.0* 21.2* 21.9* 29.7* 27.7*   Right flank pain/Rt groin pain/Rt psoas enlargement: We obtain CT pelvis/lumbar spine- showed right psoas enlargement most compatible with intramuscular hematoma infection possible but at  this time patient does not have any fever spike or  leukocytosis.Will monitor closely.I discussed with vascular surgery.She does has hx of multiple sclerosis,rt sided weakness from stroke,immobility contributing to pain.  Was placed on Neurontin.Patient having more mobility issues we will have PT/OT reeval may need a skilled nursing facility.We will add IV Dilaudid and oral Norco for pain control-but hold for sedation.  Multiple sclerosis:??MS for years as per daughter patient was followed supposed to have a follow-up with Dr. Leonie Man from last discharge back in February but it appears he has not followed up.  I made an internal referral again to neurology Dr Leonie Man. CT head this admission shows similar white matter hypodensity bilaterally. Diet Order            Diet renal/carb modified with fluid restriction Diet-HS Snack? Nothing; Fluid restriction: 1200 mL Fluid; Room service appropriate? Yes; Fluid consistency: Thin  Diet effective now               Patient's Body mass index is 23.06 kg/m. DVT prophylaxis:  Code Status:   Code Status: Full Code   Family Communication: plan of care discussed with patient at bedside. Discussed with patient's younger daughter over the phone 5/20,  with another daughter Hannah [name] [name] [id number], and again at the bedside 5/22, 5/23. After the CT finding I had extensive discussion with Hannah [name] [name] and udpated her regarding POC and she agrees.  Status is: Inpatient. Remains inpatient appropriate because:IV treatments appropriate due to intensity of illness or inability to take PO and Inpatient level of care appropriate due to severity of illness  Dispo: The patient is from: Home lives with the daughter             Anticipated d/c is to TBD.  Will need PT OT evaluation may need SNF              Patient currently is not medically stable to d/c.   Difficult to place patient No  Unresulted Labs (From admission, onward)          Start     Ordered   10/06/20 0500  CBC  Daily,   R      10/05/20 0816   09/28/20 0500  Renal  function panel  Daily,   R      09/27/20 1218   Signed and Held  Hepatitis B surface antigen  (New Patient [facility] (Hepatitis B))  Once,   R        Signed and Held   Signed and Held  Hepatitis B surface antibody  (New Patient [facility] (Hepatitis B))  Once,   R        Signed and Held   Signed and Held  Hepatitis B core antibody, total  (New Patient [facility] (Hepatitis B))  Once,   R        Signed and Held   Signed and Held  Hepatitis C antibody  (New Patient [facility] (Hepatitis B))  Once,   R        Signed and Held         Medications reviewed:  Scheduled Meds: . amLODipine  10 mg Oral Daily  . carvedilol  6.25 mg Oral BID WC  . Chlorhexidine Gluconate Cloth  6 each Topical Q0600  . Chlorhexidine Gluconate Cloth  6 each Topical Q0600  . darbepoetin (ARANESP) injection - DIALYSIS  200 mcg Intravenous Q Mon-HD  . diclofenac Sodium  2 g Topical QID  . famotidine  20  mg Oral Daily  . FLUoxetine  20 mg Oral Daily  . gabapentin  100 mg Oral TID  . hydrALAZINE  50 mg Oral Q8H  . insulin aspart  0-6 Units Subcutaneous TID WC  . insulin glargine  10 Units Subcutaneous QHS  . melatonin  3 mg Oral QHS  . metoCLOPramide  5 mg Oral BID  . rosuvastatin  10 mg Oral Daily  . sodium chloride flush  3 mL Intravenous Q12H   Continuous Infusions: . sodium chloride Stopped (09/28/20 1354)    Consultants: Right upper extremity AV fistula placement  Procedures: Nephrology Vascular surgery  Antimicrobials: Anti-infectives (From admission, onward)   Start     Dose/Rate Route Frequency Ordered Stop   10/02/20 1049  ceFAZolin (ANCEF) 2-4 GM/100ML-% IVPB       Note to Pharmacy: Claybon Jabs   : cabinet override      10/02/20 1049 10/02/20 2259   09/29/20 1330  ceFAZolin (ANCEF) 2-4 GM/100ML-% IVPB       Note to Pharmacy: Cecile Sheerer   : cabinet override      09/29/20 1330 09/30/20 0144   09/29/20 0000  ceFAZolin (ANCEF) 1 g in sodium chloride 0.9 % 100 mL IVPB        1 g 200 mL/hr  over 30 Minutes Intravenous To Radiology 09/27/20 1457 09/29/20 1419     Culture/Microbiology No results found for: SDES, SPECREQUEST, CULT, REPTSTATUS  Other culture-see note  Objective: Vitals: Today's Vitals   10/06/20 0436 10/06/20 0602 10/06/20 0641 10/06/20 0752  BP: (!) 161/67 (!) 167/73  (!) 146/62  Pulse: 95 100  98  Resp: '16 18  19  '$ Temp: 100.2 F (37.9 C)   98.8 F (37.1 C)  TempSrc: Oral   Oral  SpO2: 95% 91%  91%  Weight:      Height:      PainSc:   8      Intake/Output Summary (Last 24 hours) at 10/06/2020 1241 Last data filed at 10/05/2020 1844 Gross per 24 hour  Intake 400 ml  Output --  Net 400 ml   Filed Weights   10/03/20 0406 10/03/20 1415 10/03/20 1800  Weight: 59.5 kg 59.2 kg 57.2 kg   Weight change:   Intake/Output from previous day: 05/22 0701 - 05/23 0700 In: 400 [Blood:400] Out: -  Intake/Output this shift: No intake/output data recorded. Filed Weights   10/03/20 0406 10/03/20 1415 10/03/20 1800  Weight: 59.5 kg 59.2 kg 57.2 kg    Examination: General exam: AAOx 3, in pain,older than stated age, weak appearing. HEENT:Oral mucosa moist, Ear/Nose WNL grossly, dentition normal. Respiratory system: bilaterally clear without wheezing or crackles, no use of accessory muscle Cardiovascular system: S1 & S2 +, No JVD,. Gastrointestinal system: Abdomen soft, tender right flank to groin area, ND, BS+ Nervous System:Alert, awake, moving extremities and grossly nonfocal Extremities: no edema, distal peripheral pulses palpable.  RUE AVF+ Skin: No rashes,no icterus. MSK: thin muscle bulk,tone, power  Data Reviewed: I have personally reviewed following labs and imaging studies CBC: Recent Labs  Lab 10/03/20 0327 10/04/20 0423 10/05/20 0239 10/05/20 0626 10/05/20 2231 10/06/20 0246  WBC 7.5 8.3 8.2 8.8  --  10.2  HGB 7.7* 8.1* 6.8* 7.0* 9.8* 9.1*  HCT 24.0* 25.0* 21.2* 21.9* 29.7* 27.7*  MCV 89.2 92.3 92.2 90.9  --  90.8  PLT 179 199 190  181  --  123456   Basic Metabolic Panel: Recent Labs  Lab 10/02/20 0429 10/03/20 0327 10/04/20 0423  10/05/20 0239 10/06/20 0246  NA 132* 128* 132* 129* 128*  K 3.4* 3.6 3.2* 3.3* 3.2*  CL 98 96* 97* 95* 93*  CO2 '26 25 27 24 23  '$ GLUCOSE 95 164* 110* 216* 165*  BUN <5* 7 <5* 9 17  CREATININE 2.58* 3.90* 2.62* 4.03* 5.35*  CALCIUM 8.8* 8.7* 8.8* 8.4* 8.6*  PHOS 2.2* 3.1 2.3* 3.0 3.5   GFR: Estimated Creatinine Clearance: 9.3 mL/min (A) (by C-G formula based on SCr of 5.35 mg/dL (H)). Liver Function Tests: Recent Labs  Lab 10/02/20 0429 10/03/20 0327 10/04/20 0423 10/05/20 0239 10/06/20 0246  ALBUMIN 2.9* 2.7* 2.8* 2.4* 2.8*   No results for input(s): LIPASE, AMYLASE in the last 168 hours. No results for input(s): AMMONIA in the last 168 hours. Coagulation Profile: No results for input(s): INR, PROTIME in the last 168 hours. Cardiac Enzymes: No results for input(s): CKTOTAL, CKMB, CKMBINDEX, TROPONINI in the last 168 hours. BNP (last 3 results) No results for input(s): PROBNP in the last 8760 hours. HbA1C: No results for input(s): HGBA1C in the last 72 hours. CBG: Recent Labs  Lab 10/05/20 1150 10/05/20 1639 10/05/20 2058 10/06/20 0754 10/06/20 1210  GLUCAP 121* 278* 200* 141* 132*   Lipid Profile: No results for input(s): CHOL, HDL, LDLCALC, TRIG, CHOLHDL, LDLDIRECT in the last 72 hours. Thyroid Function Tests: No results for input(s): TSH, T4TOTAL, FREET4, T3FREE, THYROIDAB in the last 72 hours. Anemia Panel: No results for input(s): VITAMINB12, FOLATE, FERRITIN, TIBC, IRON, RETICCTPCT in the last 72 hours. Sepsis Labs: No results for input(s): PROCALCITON, LATICACIDVEN in the last 168 hours.  Recent Results (from the past 240 hour(s))  Resp Panel by RT-PCR (Flu A&B, Covid) Nasopharyngeal Swab     Status: None   Collection Time: 09/26/20  9:27 PM   Specimen: Nasopharyngeal Swab; Nasopharyngeal(NP) swabs in vial transport medium  Result Value Ref Range  Status   SARS Coronavirus 2 by RT PCR NEGATIVE NEGATIVE Final    Comment: (NOTE) SARS-CoV-2 target nucleic acids are NOT DETECTED.  The SARS-CoV-2 RNA is generally detectable in upper respiratory specimens during the acute phase of infection. The lowest concentration of SARS-CoV-2 viral copies this assay can detect is 138 copies/mL. A negative result does not preclude SARS-Cov-2 infection and should not be used as the sole basis for treatment or other patient management decisions. A negative result may occur with  improper specimen collection/handling, submission of specimen other than nasopharyngeal swab, presence of viral mutation(s) within the areas targeted by this assay, and inadequate number of viral copies(<138 copies/mL). A negative result must be combined with clinical observations, patient history, and epidemiological information. The expected result is Negative.  Fact Sheet for Patients:  EntrepreneurPulse.com.au  Fact Sheet for Healthcare Providers:  IncredibleEmployment.be  This test is no t yet approved or cleared by the [place] FDA and  has been authorized for detection and/or diagnosis of SARS-CoV-2 by FDA under an Emergency Use Authorization (EUA). This EUA will remain  in effect (meaning this test can be used) for the duration of the COVID-19 declaration under Section 564(b)(1) of the Act, 21 U.S.C.section 360bbb-3(b)(1), unless the authorization is terminated  or revoked sooner.       Influenza A by PCR NEGATIVE NEGATIVE Final   Influenza B by PCR NEGATIVE NEGATIVE Final    Comment: (NOTE) The Xpert Xpress SARS-CoV-2/FLU/RSV plus assay is intended as an aid in the diagnosis of influenza from Nasopharyngeal swab specimens and should not be used as a sole basis for  treatment. Nasal washings and aspirates are unacceptable for Xpert Xpress SARS-CoV-2/FLU/RSV testing.  Fact Sheet for  Patients: EntrepreneurPulse.com.au  Fact Sheet for Healthcare Providers: IncredibleEmployment.be  This test is not yet approved or cleared by the [place] FDA and has been authorized for detection and/or diagnosis of SARS-CoV-2 by FDA under an Emergency Use Authorization (EUA). This EUA will remain in effect (meaning this test can be used) for the duration of the COVID-19 declaration under Section 564(b)(1) of the Act, 21 U.S.C. section 360bbb-3(b)(1), unless the authorization is terminated or revoked.  Performed at [facility], [place] [address] [place], [place] [address]      Radiology Studies: CT LUMBAR SPINE WO CONTRAST  Result Date: 10/06/2020 CLINICAL DATA:  Low back pain and right-sided abdominal pain. EXAM: CT LUMBAR SPINE WITHOUT CONTRAST TECHNIQUE: Multidetector CT imaging of the lumbar spine was performed without intravenous contrast administration. Multiplanar CT image reconstructions were also generated. COMPARISON:  None. FINDINGS: Segmentation: 5 lumbar type vertebral bodies. Alignment: Normal Vertebrae: No fracture or focal lesion at L4 or above. At L5-S1, there are some irregularities of the endplates, more affecting the inferior endplate of L5 than the superior endplate of S1. These are favored to represent degenerative change. However, based on the findings associated with the right ileo psoas musculature, I cannot completely rule out the possibility of discitis at this level. See below. Paraspinal and other soft tissues: Marked enlargement of the right psoas muscle with areas of increased and decreased density. This involves the right iliacus muscle to a lesser degree. The appearance seems more consistent with spontaneous ileo psoas hemorrhage than infection, but I can not completely rule out that possibility. There is not appear to be any more generalized retroperitoneal bleeding or definite inflammatory change at  the paravertebral L5-S1 level. There is aortic atherosclerotic calcification. There is a low-density area in the upper pole of the right kidney which is not primarily or completely evaluated but assumed to represent a cyst. 2.7 cm cyst with seen in this region by recent ultrasound. Disc levels: No significant disc space pathology at T12-L1 or L1-2. L2-3: Chronic left-sided disc protrusion with annular calcification. Mild narrowing of the left lateral recess. L3-4: Unremarkable interspace. L4-5: Mild bulging of the disc.  No compressive stenosis. L5-S1: Endplate irregularities and bulging of the disc as described above. Findings are favored to represent degenerative change rather than infectious discitis, though I can not completely exclude the latter. IMPRESSION: Enlargement of the right psoas muscle and to a lesser extent the upper portion of the right iliacus muscle with areas of low and high density most consistent with spontaneous intramuscular hematoma. I can not completely rule out the possibility of infection but do not favor that. Some irregularity of the endplates at 075-GRM, favored to be degenerative. Early discitis is not excluded but not favored because of the reasons enumerated above. Mild degenerative changes in the lumbar spine as above, without advanced finding or definite neural compressive stenosis. These results will be called to the ordering clinician or representative by the Radiologist Assistant, and communication documented in the PACS or [employer]. Electronically Signed   By: Nelson Chimes M.D.   On: 10/06/2020 11:46   CT PELVIS WO CONTRAST  Result Date: 10/06/2020 CLINICAL DATA:  RLQ abdomen pain EXAM: CT PELVIS WITHOUT CONTRAST TECHNIQUE: Multidetector CT imaging of the pelvis was performed following the standard protocol without intravenous contrast. COMPARISON:  Same day CT lumbar spine. FINDINGS: Urinary Tract:  The bladder is moderately  distended. Bowel:  Unremarkable  visualized pelvic bowel loops. Vascular/Lymphatic: There is aortoiliac atherosclerotic disease. No pathologically enlarged lymph nodes. Reproductive:  Unremarkable. Other: There is heterogeneous collection with enlargement of the right psoas muscle, measuring approximately 12.5 x 5.2 x 5.0 cm, and to much lesser extent the upper portion of the right iliacus muscle. Musculoskeletal: No evidence of acute fracture. Symmetric bilateral SI joint degenerative change. There is no osseous destruction is/erosion. There is mild bilateral hip degenerative change. Mild endplate irregularity at L5-S1, see separately dictated lumbar spine CT. There are multiple soft tissue densities along the lower anterior abdomen, likely from medication injection. IMPRESSION: Right psoas muscle enlargement and due to an intramuscular heterogeneous collection measuring approximately 12.5 x 5.2 x 5.0 cm, most compatible with intramuscular hematoma. Infection is possible. Correlate with laboratory findings. No evidence of acute fracture. Symmetric degenerative changes involving the SI joints. Endplate irregularity at L5-S1 which is favored to be chronic. Given the iliopsoas findings, discitis-osteomyelitis or right SI joint infection cannot be completely excluded on noncontrast CT, but felt to be less likely. MRI would be useful for further evaluation. As noted on separately dictated CT of the lumbar spine, these results will be called to the ordering clinician or representative by the Radiologist Assistant, and communication documented in the PACS or [employer]. Electronically Signed   By: Maurine Simmering   On: 10/06/2020 12:12     LOS: 9 days   Antonieta Pert, MD [facility]  10/06/2020, 12:41 PM

## 2020-10-06 NOTE — Progress Notes (Signed)
Nephrology Progress Note:    Subjective:  no UOP charted over 5/22.  Last HD on 5/20 with 2 kg UF. States that she is not walking and that she got up to chair with help.  Spoke with daughter at bedside.  Her daughter is worried about her being so weak   Review of systems:  Denies shortness of breath or chest pain Denies n/v  Objective Vital signs in last 24 hours: Vitals:   10/06/20 0329 10/06/20 0436 10/06/20 0602 10/06/20 0752  BP: 132/67 (!) 161/67 (!) 167/73 (!) 146/62  Pulse: 92 95 100 98  Resp: '17 16 18 19  '$ Temp:  100.2 F (37.9 C)  98.8 F (37.1 C)  TempSrc:  Oral  Oral  SpO2: 98% 95% 91% 91%  Weight:      Height:       Weight change:   Intake/Output Summary (Last 24 hours) at 10/06/2020 [id number] Last data filed at 10/05/2020 1844 Gross per 24 hour  Intake 400 ml  Output --  Net 400 ml    Assessment/Plan: [name] with DM and HTN with progressive CKD and uremic symptoms   1.ESRD - CKD progressed to ESRD.  S/p AVF 5/19; appreciate help from [facility].   - Continue dialysis on MWF schedule while inpatient  - She is arranged to start at [facility] unit for HD however doesn't appear very mobile.  We are getting her unit changed - please avoid morphine given ESRD   2. Hypertension/volume  - improved with HD and medication titration  3. Anemia CKD and iron deficiency.  s/p repletion w/ 1g of iron and aranesp was just increase to 278mg weekly on mondays.    4. Metabolic bone disease-  PTH 84. No treatment needed. Phos acceptable on HD  5. Metabolic Acidosis- managed with HD  Disposition - per primary team.  She is getting HD here today.  If she is to discharge to SNF (and right now is not mobile) will need to transition to another unit for hemodialysis.  Spoke with HD SW to update and we are looking at other units   Labs: Basic Metabolic Panel: Recent Labs  Lab 10/04/20 0423 10/05/20 0239 10/06/20 0246  NA 132* 129* 128*  K 3.2* 3.3* 3.2*  CL 97* 95*  93*  CO2 '27 24 23  '$ GLUCOSE 110* 216* 165*  BUN <5* 9 17  CREATININE 2.62* 4.03* 5.35*  CALCIUM 8.8* 8.4* 8.6*  PHOS 2.3* 3.0 3.5   Liver Function Tests: Recent Labs  Lab 10/04/20 0423 10/05/20 0239 10/06/20 0246  ALBUMIN 2.8* 2.4* 2.8*   CBC: Recent Labs  Lab 10/03/20 0327 10/04/20 0423 10/05/20 0239 10/05/20 0626 10/05/20 2231 10/06/20 0246  WBC 7.5 8.3 8.2 8.8  --  10.2  HGB 7.7* 8.1* 6.8* 7.0* 9.8* 9.1*  HCT 24.0* 25.0* 21.2* 21.9* 29.7* 27.7*  MCV 89.2 92.3 92.2 90.9  --  90.8  PLT 179 199 190 181  --  201   CBG: Recent Labs  Lab 10/05/20 0839 10/05/20 1150 10/05/20 1639 10/05/20 2058 10/06/20 0754  GLUCAP 120* 121* 278* 200* 141*    Studies/Results: No results found.  Medications: Infusions: . sodium chloride Stopped (09/28/20 1354)    Scheduled Medications: . amLODipine  10 mg Oral Daily  . carvedilol  6.25 mg Oral BID WC  . Chlorhexidine Gluconate Cloth  6 each Topical Q0600  . Chlorhexidine Gluconate Cloth  6 each Topical Q0600  . clopidogrel  75 mg Oral Daily  .  darbepoetin (ARANESP) injection - DIALYSIS  200 mcg Intravenous Q Mon-HD  . diclofenac Sodium  2 g Topical QID  . famotidine  20 mg Oral Daily  . FLUoxetine  20 mg Oral Daily  . gabapentin  100 mg Oral TID  . heparin  5,000 Units Subcutaneous Q8H  . hydrALAZINE  50 mg Oral Q8H  . insulin aspart  0-6 Units Subcutaneous TID WC  . insulin glargine  10 Units Subcutaneous QHS  . melatonin  3 mg Oral QHS  . metoCLOPramide  5 mg Oral BID  . rosuvastatin  10 mg Oral Daily  . sodium chloride flush  3 mL Intravenous Q12H    have reviewed scheduled and prn medications.  Vitals:   10/06/20 0602 10/06/20 0752  BP: (!) 167/73 (!) 146/62  Pulse: 100 98  Resp: 18 19  Temp:  98.8 F (37.1 C)  SpO2: 91% 91%    Physical exam: General adult female in bed in no acute distress at rest but weak HEENT normocephalic atraumatic extraocular movements intact sclera anicteric Neck supple  trachea midline Lungs clear to auscultation bilaterally normal work of breathing at rest  Heart S1S2 no rub Abdomen soft nontender nondistended Extremities no edema  Psych normal mood and affect Neuro lying in bed on my exam Access RIJ tunn catheter; right AVF bruit  Claudia Desanctis, MD 10/06/2020,8:51 AM

## 2020-10-06 NOTE — Plan of Care (Signed)
  Problem: Education: Goal: Knowledge of General Education information will improve Description Including pain rating scale, medication(s)/side effects and non-pharmacologic comfort measures Outcome: Progressing   Problem: Health Behavior/Discharge Planning: Goal: Ability to manage health-related needs will improve Outcome: Progressing   

## 2020-10-06 NOTE — Progress Notes (Signed)
Patient pain 10/10  10/05/20 2258  Assess: MEWS Score  Temp 97.7 F (36.5 C)  BP (!) 197/81  Pulse Rate (!) 102  Resp (!) 21  Level of Consciousness Alert  SpO2 96 %  O2 Device Room Air  Patient Activity (if Appropriate) In bed  Assess: MEWS Score  MEWS Temp 0  MEWS Systolic 0  MEWS Pulse 1  MEWS RR 1  MEWS LOC 0  MEWS Score 2  MEWS Score Color Yellow  Assess: if the MEWS score is Yellow or Red  Were vital signs taken at a resting state? Yes  Focused Assessment Change from prior assessment (see assessment flowsheet)  Early Detection of Sepsis Score *See Row Information* Medium  MEWS guidelines implemented *See Row Information* Yes  Treat  Pain Scale 0-10  Pain Score 10  Pain Type Acute pain  Pain Location Hip  Pain Orientation Right  Pain Descriptors / Indicators Sharp;Stabbing  Pain Frequency Constant  Pain Onset On-going  Take Vital Signs  Increase Vital Sign Frequency  Yellow: Q 2hr X 2 then Q 4hr X 2, if remains yellow, continue Q 4hrs  Escalate  MEWS: Escalate Yellow: discuss with charge nurse/RN and consider discussing with provider and RRT  Notify: Charge Nurse/RN  Name of Charge Nurse/RN Notified Blanch Media, RN  Date Charge Nurse/RN Notified 10/05/20  Time Charge Nurse/RN Notified 2301  Document  Progress note created (see row info) Yes

## 2020-10-06 NOTE — Progress Notes (Signed)
OT Cancellation Note  Patient Details Name: Hannah [name] MRN: [id number] DOB: 03-Aug-1965   Cancelled Treatment:    Reason Eval/Treat Not Completed: Patient at procedure or test/ unavailable (pt off of floor upon arrival, will re-attempt OT tx today as time permits)  Elivia Robotham A Butch Otterson 10/06/2020, 2:45 PM

## 2020-10-06 NOTE — TOC Progression Note (Signed)
Transition of Care [facility]) - Progression Note    Patient Details  Name: Hannah [name] MRN: [id number] Date of Birth: [birth year]  [facility] of [facility]) CM/SW Contact  Milinda Antis, [place] Phone Number: 10/06/2020, 3:46 PM  Clinical Narrative:     CSW attempted to complete an assessment with the patient to begin the SNF process.  The patient was not in the room.   CSW will follow up with patient at another time.       Expected Discharge Plan and Services                                                 Social Determinants of Health (SDOH) Interventions    Readmission Risk Interventions No flowsheet data found.

## 2020-10-06 NOTE — Progress Notes (Signed)
Physical Therapy Treatment Patient Details Name: Hannah [name] MRN: [id number] DOB: May 27, 1965 Today's Date: 10/06/2020    History of Present Illness 55 y.o. female presented with shortness of breath and lower extremity swelling. Admitted on 09/26/20 with Volume overload.  Additionally, pt with ESRD and HD has been initiated this admission.  Pt underwent AV fistula placement 5/19.  Has RLE weakness and swelling in psoas mm on R, limited mobiltiy.  Pt with medical history significant of CKD 5, anemia, CVA, diabetes, hypertension, hyperlipidemia, multiple sclerosis    PT Comments    Pt was seen for bed exercises with cues for direction and extent of effort.  Has limited endurance and energy, unable to get OOB with PT per pt report.  Talked with dc planning team about changing her plan to SNF for follow up of mobility to focus on residual R LE weakness, possible hematoma on R hip psoas and her struggle to get walking again.  Pt's family is supportive of the idea.  Follow for acute PT goals as below.   Follow Up Recommendations  SNF     Equipment Recommendations  None recommended by PT    Recommendations for Other Services       Precautions / Restrictions Precautions Precautions: Fall;Other (comment) Precaution Comments: baseline RLE weak but has edema in psoas Required Braces or Orthoses: Other Brace (has R ankle brace at home) Restrictions Weight Bearing Restrictions: No    Mobility  Bed Mobility Overal bed mobility: Needs Assistance             General bed mobility comments: declined OOB    Transfers                 General transfer comment: declined OOB  Ambulation/Gait                 Stairs             Wheelchair Mobility    Modified Rankin (Stroke Patients Only) Modified Rankin (Stroke Patients Only) Pre-Morbid Rankin Score: Moderate disability Modified Rankin: Moderately severe disability     Balance Overall balance assessment: Needs  assistance                                          Cognition Arousal/Alertness: Awake/alert Behavior During Therapy: WFL for tasks assessed/performed Overall Cognitive Status: Within Functional Limits for tasks assessed                                 General Comments: speech changes from stroke but is on point in conversation      Exercises General Exercises - Lower Extremity Ankle Circles/Pumps: AAROM;5 reps Quad Sets: AAROM;10 reps Gluteal Sets: AROM;10 reps Heel Slides: AAROM;10 reps Hip ABduction/ADduction: AAROM;10 reps Hip Flexion/Marching: AAROM;10 reps    General Comments General comments (skin integrity, edema, etc.): pt is in bed with compression garments, doing exercises on cue and has limited ROM and strength on RLE      Pertinent Vitals/Pain Pain Assessment: No/denies pain    Home Living                      Prior Function            PT Goals (current goals can now be found in the care plan section) Acute Rehab PT  Goals Patient Stated Goal: home Progress towards PT goals: Not progressing toward goals - comment    Frequency    Min 3X/week      PT Plan Discharge plan needs to be updated    Co-evaluation              AM-PAC PT "6 Clicks" Mobility   Outcome Measure  Help needed turning from your back to your side while in a flat bed without using bedrails?: A Little Help needed moving from lying on your back to sitting on the side of a flat bed without using bedrails?: A Little Help needed moving to and from a bed to a chair (including a wheelchair)?: A Little Help needed standing up from a chair using your arms (e.g., wheelchair or bedside chair)?: A Little Help needed to walk in hospital room?: A Little Help needed climbing 3-5 steps with a railing? : A Lot 6 Click Score: 17    End of Session Equipment Utilized During Treatment: Other (comment) Activity Tolerance: Patient tolerated treatment  well;Patient limited by fatigue Patient left: in bed;with call bell/phone within reach;with bed alarm set Nurse Communication: Mobility status PT Visit Diagnosis: Other abnormalities of gait and mobility (R26.89);Muscle weakness (generalized) (M62.81);Difficulty in walking, not elsewhere classified (R26.2);Adult, failure to thrive (R62.7)     Time: 1230-1259 PT Time Calculation (min) (ACUTE ONLY): 29 min  Charges:  $Therapeutic Exercise: 23-37 mins                  Ramond Dial 10/06/2020, 1:25 PM  Mee Hives, PT MS Acute Rehab Dept. Number: [place] and [place]

## 2020-10-06 NOTE — Plan of Care (Signed)

## 2020-10-06 NOTE — Progress Notes (Signed)
Was asked to review CT abdomen pelvis without contrast.  I agree that her right psoas muscle looks larger than left and could be consistent with psoas hematoma.  No heparin was given during her fistula creation.  Unclear about the etiology.  No indication for surgery from a vascular standpoint.  Agree with hospitalist that if she continues to drop her hemoglobin could get a contrasted study but again these are normally managed without surgical intervention.  Marty Heck, MD [facility] Office: [place]

## 2020-10-07 DIAGNOSIS — I10 Essential (primary) hypertension: Secondary | ICD-10-CM | POA: Diagnosis not present

## 2020-10-07 LAB — CBC
HCT: 27.1 % — ABNORMAL LOW (ref 36.0–46.0)
Hemoglobin: 9.1 g/dL — ABNORMAL LOW (ref 12.0–15.0)
MCH: 30.3 pg (ref 26.0–34.0)
MCHC: 33.6 g/dL (ref 30.0–36.0)
MCV: 90.3 fL (ref 80.0–100.0)
Platelets: 198 10*3/uL (ref 150–400)
RBC: 3 MIL/uL — ABNORMAL LOW (ref 3.87–5.11)
RDW: 16.8 % — ABNORMAL HIGH (ref 11.5–15.5)
WBC: 9.8 10*3/uL (ref 4.0–10.5)
nRBC: 0.3 % — ABNORMAL HIGH (ref 0.0–0.2)

## 2020-10-07 LAB — HEMOGLOBIN AND HEMATOCRIT, BLOOD
HCT: 27.2 % — ABNORMAL LOW (ref 36.0–46.0)
Hemoglobin: 8.8 g/dL — ABNORMAL LOW (ref 12.0–15.0)

## 2020-10-07 LAB — RENAL FUNCTION PANEL
Albumin: 2.6 g/dL — ABNORMAL LOW (ref 3.5–5.0)
Anion gap: 9 (ref 5–15)
BUN: 14 mg/dL (ref 6–20)
CO2: 26 mmol/L (ref 22–32)
Calcium: 8.3 mg/dL — ABNORMAL LOW (ref 8.9–10.3)
Chloride: 96 mmol/L — ABNORMAL LOW (ref 98–111)
Creatinine, Ser: 4.04 mg/dL — ABNORMAL HIGH (ref 0.44–1.00)
GFR, Estimated: 12 mL/min — ABNORMAL LOW (ref >60)
Glucose, Bld: 196 mg/dL — ABNORMAL HIGH (ref 70–99)
Phosphorus: 3.1 mg/dL (ref 2.5–4.6)
Potassium: 4 mmol/L (ref 3.5–5.1)
Sodium: 131 mmol/L — ABNORMAL LOW (ref 135–145)

## 2020-10-07 LAB — GLUCOSE, CAPILLARY
Glucose-Capillary: 197 mg/dL — ABNORMAL HIGH (ref 70–99)
Glucose-Capillary: 209 mg/dL — ABNORMAL HIGH (ref 70–99)
Glucose-Capillary: 280 mg/dL — ABNORMAL HIGH (ref 70–99)
Glucose-Capillary: 252 mg/dL — ABNORMAL HIGH (ref 70–99)

## 2020-10-07 MED ORDER — CHLORHEXIDINE GLUCONATE CLOTH 2 % EX PADS
6.0000 | MEDICATED_PAD | Freq: Every day | CUTANEOUS | Status: DC
  Administered 2020-10-08: 6 via TOPICAL

## 2020-10-07 NOTE — Progress Notes (Signed)
Occupational Therapy Treatment Patient Details Name: Hannah [name] MRN: [id number] DOB: [birth year] Today's Date: 10/07/2020    History of present illness 55 y.o. female presented with shortness of breath and lower extremity swelling. Admitted on 09/26/20 with Volume overload.  Additionally, pt with ESRD and HD has been initiated this admission.  Pt underwent AV fistula placement 5/19.  Has RLE weakness and swelling in psoas mm on R, limited mobiltiy.  Pt with medical history significant of CKD 5, anemia, CVA, diabetes, hypertension, hyperlipidemia, multiple sclerosis   OT comments  Pt not currently progressing towards her goals, she is limited by pain due to edema in R psoas. Pt was initially refusing upon arrival however with gentle encouragement she was willing to participate and very pleasant for therapy. Pt required min/mod A for bed mobility and heavy mod A for boosting from sit>stand. Pt stood with rw for 2 trials, about 1 minute each prior to R leg buckling from fatigue and needing sit down breaks. In standing pt marched in placed with heavy mod A for weight shift to R. Pt would benefit from continued OT acutely to progress OOB function in mobility and ADLs. D/c recommendation updated to CIR level of rehab to promote function for safe transition into home environment.     Follow Up Recommendations  CIR;Other (comment) (Pt with decline in function, recommend follow up rehab, will consult CIR for approval, if declined recommend pt go to SNF)    Equipment Recommendations  None recommended by OT    Recommendations for Other Services Rehab consult    Precautions / Restrictions Precautions Precautions: Fall Precaution Comments: baseline RLE weak but has edema in psoas Required Braces or Orthoses: Other Brace Restrictions Weight Bearing Restrictions: No       Mobility Bed Mobility Overal bed mobility: Needs Assistance Bed Mobility: Rolling;Sidelying to Sit;Sit to Supine Rolling: Min  assist Sidelying to sit: Mod assist;HOB elevated   Sit to supine: Mod assist        Transfers Overall transfer level: Needs assistance Equipment used: Rolling walker (2 wheeled) Transfers: Sit to/from Stand Sit to Stand: Mod assist;From elevated surface         General transfer comment: heavy mod A to boost from sit>stand, light mod A for balance in standing, pt with R sided weakness and buckling with fatigue    Balance Overall balance assessment: Needs assistance Sitting-balance support: No upper extremity supported;Feet supported Sitting balance-Leahy Scale: Fair Sitting balance - Comments: demonstrates posterior lean, requires tactile cue to correct   Standing balance support: Bilateral upper extremity supported;During functional activity Standing balance-Leahy Scale: Poor            ADL either performed or assessed with clinical judgement   ADL Overall ADL's : Needs assistance/impaired      Functional mobility during ADLs: Moderate assistance (fatigues quickly) General ADL Comments: session focused on OOB transfers and mobility     Vision   Vision Assessment?: Vision impaired- to be further tested in functional context   Perception     Praxis      Cognition Arousal/Alertness: Awake/alert Behavior During Therapy: WFL for tasks assessed/performed Overall Cognitive Status: Within Functional Limits for tasks assessed          General Comments: speech changes from stroke but is on point in conversation              General Comments no new skin integrity issues noted    Pertinent Vitals/ Pain       Pain  Assessment: Faces Faces Pain Scale: Hurts little more Pain Descriptors / Indicators: Grimacing;Guarding Pain Intervention(s): Limited activity within patient's tolerance;Monitored during session         Frequency  Min 2X/week        Progress Toward Goals  OT Goals(current goals can now be found in the care plan section)  Progress  towards OT goals: Not progressing toward goals - comment (Pt with incrased pain in R hip/leg limiting her mobility and effecting her progress towards goals)  Acute Rehab OT Goals Patient Stated Goal: home OT Goal Formulation: With patient Time For Goal Achievement: 10/15/20 Potential to Achieve Goals: Good ADL Goals Pt Will Perform Grooming: with supervision;standing Pt Will Perform Upper Body Bathing: with supervision;sitting Pt Will Perform Lower Body Bathing: with supervision;sit to/from stand Pt Will Perform Lower Body Dressing: with modified independence;sit to/from stand Pt Will Transfer to Toilet: with supervision;ambulating;bedside commode Pt Will Perform Toileting - Clothing Manipulation and hygiene: with supervision;sit to/from stand Additional ADL Goal #1: Pt will be Mod in and OOB for basic ADLs (increaased time) Additional ADL Goal #2: Patient will identify at least 3 energy conservation strategies to employ at home in order to maximize function and quality of life and decrease caregiver burden while preventing exacerbation of symptoms and rehospitalization.  Plan Discharge plan needs to be updated       AM-PAC OT "6 Clicks" Daily Activity     Outcome Measure   Help from another person eating meals?: A Little Help from another person taking care of personal grooming?: A Little Help from another person toileting, which includes using toliet, bedpan, or urinal?: A Lot Help from another person bathing (including washing, rinsing, drying)?: A Lot Help from another person to put on and taking off regular upper body clothing?: A Little Help from another person to put on and taking off regular lower body clothing?: A Lot 6 Click Score: 15    End of Session Equipment Utilized During Treatment: Gait belt;Rolling walker  OT Visit Diagnosis: Unsteadiness on feet (R26.81);Other abnormalities of gait and mobility (R26.89);Pain;Muscle weakness (generalized) (M62.81);Hemiplegia and  hemiparesis Hemiplegia - Right/Left: Right Hemiplegia - dominant/non-dominant: Dominant Hemiplegia - caused by: Cerebral infarction Pain - Right/Left: Right Pain - part of body: Hip;Leg   Activity Tolerance Patient tolerated treatment well   Patient Left in bed;with call bell/phone within reach;with bed alarm set   Nurse Communication Mobility status        Time: 1530 [id number] OT Time Calculation (min): 26 min  Charges: OT General Charges $OT Visit: 1 Visit OT Treatments $Self Care/Home Management : 23-37 mins    Jhaniya Briski A Vernita Tague 10/07/2020, 4:12 PM

## 2020-10-07 NOTE — Progress Notes (Signed)
Bedside shift report complete. Received patient awake,alert.orientedx4 and able to verbalize needs. NAD noted; respirations easy on room air. Tele monitor on. Movement/sensation noted to all extremities. SCDs on to b/ lower extremities. RIJ HD catheter noted. Whiteboard updated. All safety measures in place and personal belongings within reach.

## 2020-10-07 NOTE — Progress Notes (Signed)
PROGRESS NOTE    Hannah [name]  [id number] DOB: 1966-04-27 DOA: 09/26/2020 PCP: Vernie Shanks, MD   Chief Complaint  Patient presents with  . Leg Swelling  Brief Narrative: 55 year old female with CKD 5, anemia, CVA, hypertension, diabetes, hyperlipidemia, multiple sclerosis presents with shortness of breath, lower extremity swelling, onset 2 to 3 days prior to admission.  Patient was seen by PCP and sent to the ED for evaluation.  Recently admitted 2 weeks ago for AKI on CKD 4-renal ultrasound was unremarkable believed to be progression of her CKD, her blood pressure medication were adjusted and was planning for nephrology outpatient follow-up. She was seen in the ED creatinine 5.3 similar to previous with anemia elevated BNP DVT study was negative chest x-ray showed ill-defined opacity consistent with pneumonia in the right middle and lower lobes and some atelectasis.  Nephrology was consulted with her volume overload likely approaching ESRD, patient was admitted for further management. Seen by nephrology started on hemodialysis-now on Monday Wednesday Friday schedule. Outpatient dialysis has been set up starting 5/23 at TCU. Patient was complaining of right flank and right thigh pain and CT scan showed right psoas hematoma  Subjective: still complains of right thigh and groin pain She has been afebrile Resting comfortably on room air.  Assessment & Plan:  Longstanding CKD that has progressed to ESRD w/ Volume overload Uremia/metabolic acidosis: Now on HD MWF, status post aVF 5/19.  Outpatient dialysis has been arranged from 10/06/20 at TCU.  Nephrology following fluid status electrolytes stable with low potassium.  Continue dialysis.  Recent Labs  Lab 10/03/20 0327 10/04/20 0423 10/05/20 0239 10/06/20 0246 10/07/20 0831  BUN 7 <5* '9 17 14  '$ CREATININE 3.90* 2.62* 4.03* 5.35* 4.04*   Essential hypertension,BP controlled continue home amlodipine 10 mg carvedilol 6.25 mg   Hydralazine 50 mg.  Monitor.  Hypokalemia improved with dialysis  Type 2 diabetes mellitus hemoglobin A1c 8.7 most recently.  Blood sugar fairly stable continue on Lantus 10 units and sliding scale. . At home on Tresiba  And also insulin 10 units 3 times daily. Home glipizide discontinued. Recent Labs  Lab 10/06/20 1210 10/06/20 1859 10/06/20 2025 10/07/20 0829 10/07/20 1122  GLUCAP 132* 185* 237* 197* 209*   History of  Recent CVA in jan 2022 w/ Rt sided spastic weakness/Hyperlipidemia:Continue on Crestor.Plavix was resumed after fistula surgery but holding 10/06/20-due to psoas hematoma and anemia. Based on previous documentation she was having dysarthric speech since jan 2022. CT head this admission shows moderate extensive white matter hypodensity bilaterally similar to prior CT and MRI may represent demyelinating disease with history of multiple sclerosis.She will need follow-up with neurology as outpatient as planned before-this was discussed with patient's daughter.  Acute blood loss anemia needing transfusion Anemia of chronic disease/renal disease,managed by ferric gluconate,ESA by nephrology. Hb dropped to 6.8 g 5/22-transfuse 1 unit PRBC and increased more than appropriately. Suspect patient had acute blood loss anemia from a spontaneous right psoas muscle hematoma.  Recent Labs  Lab 10/05/20 0626 10/05/20 2231 10/06/20 0246 10/06/20 1954 10/07/20 0831  HGB 7.0* 9.8* 9.1* 9.3* 9.1*  HCT 21.9* 29.7* 27.7* 28.0* 27.1*   Right flank pain/Rt groin pain/Rt psoas enlargement: We obtain CT pelvis/lumbar spine- showed right psoas enlargement most compatible with intramuscular hematoma infection possible but at this time patient does not have any fever spike or leukocytosis. I discussed with vascular surgery, if patient has further drop in hemoglobin can consider CT scan with IV contrast although would not change  managemen .continue to  hold Plavix and sq heparin for now. H&H has  remained stable, patient without fever or leukocytosis. She does has hx of multiple sclerosis,rt sided weakness from stroke,immobility contributing to pain.  We will resume muscle relaxant stop Neurontin continue pain control with oral and IV opiates.   Left abdomen wall hematoma at heparin inj site- off heparin  Multiple sclerosis:??MS for years as per daughter patient was supposed to have a follow-up with Dr. Leonie Man from last discharge back in February but it appears she has not followed up. I made an internal referral again to neurology Dr Leonie Man. CT head this admission shows similar white matter hypodensity bilaterally. Diet Order            Diet renal/carb modified with fluid restriction Diet-HS Snack? Nothing; Fluid restriction: 1200 mL Fluid; Room service appropriate? Yes; Fluid consistency: Thin  Diet effective now               Patient's Body mass index is 23.39 kg/m. DVT prophylaxis:  Code Status:   Code Status: Full Code   Family Communication: plan of care discussed with patient at bedside. Plan of care was extensively discussed with patient's daughters and had updated after CT scan result  5/23  Status is: Inpatient. Remains inpatient appropriate because:IV treatments appropriate due to intensity of illness or inability to take PO and Inpatient level of care appropriate due to severity of illness  Dispo: The patient is from: Home lives with the daughter             Anticipated d/c skilled nursing facility, continue PT OT eval              Patient currently is not medically stable to d/c.   Difficult to place patient No  Unresulted Labs (From admission, onward)          Start     Ordered   10/06/20 1641  Hemoglobin and hematocrit, blood  Now then every 8 hours,   R (with TIMED occurrences)      10/06/20 1640   10/06/20 0500  CBC  Daily,   R      10/05/20 0816   09/28/20 0500  Renal function panel  Daily,   R      09/27/20 1218   Signed and Held  Hepatitis B surface  antigen  (New Patient [facility] (Hepatitis B))  Once,   R        Signed and Held   Signed and Held  Hepatitis B surface antibody  (New Patient [facility] (Hepatitis B))  Once,   R        Signed and Held   Signed and Held  Hepatitis B core antibody, total  (New Patient [facility] (Hepatitis B))  Once,   R        Signed and Held   Signed and Held  Hepatitis C antibody  (New Patient [facility] (Hepatitis B))  Once,   R        Signed and Held         Medications reviewed:  Scheduled Meds: . amLODipine  10 mg Oral Daily  . carvedilol  6.25 mg Oral BID WC  . Chlorhexidine Gluconate Cloth  6 each Topical Q0600  . Chlorhexidine Gluconate Cloth  6 each Topical Q0600  . darbepoetin (ARANESP) injection - DIALYSIS  200 mcg Intravenous Q Mon-HD  . diclofenac Sodium  2 g Topical QID  . famotidine  20 mg Oral  Daily  . FLUoxetine  20 mg Oral Daily  . gabapentin  100 mg Oral TID  . hydrALAZINE  50 mg Oral Q8H  . insulin aspart  0-6 Units Subcutaneous TID WC  . insulin glargine  10 Units Subcutaneous QHS  . melatonin  3 mg Oral QHS  . metoCLOPramide  5 mg Oral BID  . rosuvastatin  10 mg Oral Daily  . sodium chloride flush  3 mL Intravenous Q12H   Continuous Infusions: . sodium chloride Stopped (09/28/20 1354)    Consultants: Right upper extremity AV fistula placement  Procedures: Nephrology Vascular surgery  Antimicrobials: Anti-infectives (From admission, onward)   Start     Dose/Rate Route Frequency Ordered Stop   10/02/20 1049  ceFAZolin (ANCEF) 2-4 GM/100ML-% IVPB       Note to Pharmacy: Claybon Jabs   : cabinet override      10/02/20 1049 10/02/20 2259   09/29/20 1330  ceFAZolin (ANCEF) 2-4 GM/100ML-% IVPB       Note to Pharmacy: Cecile Sheerer   : cabinet override      09/29/20 1330 09/30/20 0144   09/29/20 0000  ceFAZolin (ANCEF) 1 g in sodium chloride 0.9 % 100 mL IVPB        1 g 200 mL/hr over 30 Minutes Intravenous To Radiology 09/27/20 1457 09/29/20 1419      Culture/Microbiology No results found for: SDES, SPECREQUEST, CULT, REPTSTATUS  Other culture-see note  Objective: Vitals: Today's Vitals   10/07/20 0813 10/07/20 0845 10/07/20 1119 10/07/20 1332  BP: (!) 173/81 (!) 164/78  133/64  Pulse: 100   98  Resp: 17   18  Temp: 98.2 F (36.8 C)   98.5 F (36.9 C)  TempSrc: Oral   Oral  SpO2: 93%   91%  Weight:      Height:      PainSc:   2      Intake/Output Summary (Last 24 hours) at 10/07/2020 1338 Last data filed at 10/06/2020 1750 Gross per 24 hour  Intake --  Output 2507 ml  Net -2507 ml   Filed Weights   10/06/20 1412 10/06/20 1809 10/07/20 0521  Weight: 58.5 kg 56 kg 58 kg   Weight change:   Intake/Output from previous day: 05/23 0701 - 05/24 0700 In: -  Out: 2507  Intake/Output this shift: No intake/output data recorded. Filed Weights   10/06/20 1412 10/06/20 1809 10/07/20 0521  Weight: 58.5 kg 56 kg 58 kg    Examination: General exam: AAOx 3,on RA,older than stated age, weak appearing. HEENT:Oral mucosa moist, Ear/Nose WNL grossly, dentition normal. Respiratory system: bilaterally diminished, no crackles or wheezing, no use of accessory muscle Cardiovascular system: S1 & S2 +, No JVD,. Gastrointestinal system: Abdomen soft,NT,ND, BS+, small amount of abdominal wall hematoma on the left. Nervous System:Alert, awake, moving extremities and grossly nonfocal Extremities: no edema, distal peripheral pulses palpable.  Skin: No rashes,no icterus. MSK: Normal muscle bulk,tone, power RUE AVF+  Data Reviewed: I have personally reviewed following labs and imaging studies CBC: Recent Labs  Lab 10/04/20 0423 10/05/20 0239 10/05/20 0626 10/05/20 2231 10/06/20 0246 10/06/20 1954 10/07/20 0831  WBC 8.3 8.2 8.8  --  10.2  --  9.8  HGB 8.1* 6.8* 7.0* 9.8* 9.1* 9.3* 9.1*  HCT 25.0* 21.2* 21.9* 29.7* 27.7* 28.0* 27.1*  MCV 92.3 92.2 90.9  --  90.8  --  90.3  PLT 199 190 181  --  201  --  [id number]   Basic Metabolic  Panel: Recent Labs  Lab 10/03/20 0327 10/04/20 0423 10/05/20 0239 10/06/20 0246 10/07/20 0831  NA 128* 132* 129* 128* 131*  K 3.6 3.2* 3.3* 3.2* 4.0  CL 96* 97* 95* 93* 96*  CO2 '25 27 24 23 26  '$ GLUCOSE 164* 110* 216* 165* 196*  BUN 7 <5* '9 17 14  '$ CREATININE 3.90* 2.62* 4.03* 5.35* 4.04*  CALCIUM 8.7* 8.8* 8.4* 8.6* 8.3*  PHOS 3.1 2.3* 3.0 3.5 3.1   GFR: Estimated Creatinine Clearance: 12.3 mL/min (A) (by C-G formula based on SCr of 4.04 mg/dL (H)). Liver Function Tests: Recent Labs  Lab 10/03/20 0327 10/04/20 0423 10/05/20 0239 10/06/20 0246 10/07/20 0831  ALBUMIN 2.7* 2.8* 2.4* 2.8* 2.6*   No results for input(s): LIPASE, AMYLASE in the last 168 hours. No results for input(s): AMMONIA in the last 168 hours. Coagulation Profile: No results for input(s): INR, PROTIME in the last 168 hours. Cardiac Enzymes: No results for input(s): CKTOTAL, CKMB, CKMBINDEX, TROPONINI in the last 168 hours. BNP (last 3 results) No results for input(s): PROBNP in the last 8760 hours. HbA1C: No results for input(s): HGBA1C in the last 72 hours. CBG: Recent Labs  Lab 10/06/20 1210 10/06/20 1859 10/06/20 2025 10/07/20 0829 10/07/20 1122  GLUCAP 132* 185* 237* 197* 209*   Lipid Profile: No results for input(s): CHOL, HDL, LDLCALC, TRIG, CHOLHDL, LDLDIRECT in the last 72 hours. Thyroid Function Tests: No results for input(s): TSH, T4TOTAL, FREET4, T3FREE, THYROIDAB in the last 72 hours. Anemia Panel: No results for input(s): VITAMINB12, FOLATE, FERRITIN, TIBC, IRON, RETICCTPCT in the last 72 hours. Sepsis Labs: No results for input(s): PROCALCITON, LATICACIDVEN in the last 168 hours.  No results found for this or any previous visit (from the past 240 hour(s)).   Radiology Studies: CT LUMBAR SPINE WO CONTRAST  Result Date: 10/06/2020 CLINICAL DATA:  Low back pain and right-sided abdominal pain. EXAM: CT LUMBAR SPINE WITHOUT CONTRAST TECHNIQUE: Multidetector CT imaging of the  lumbar spine was performed without intravenous contrast administration. Multiplanar CT image reconstructions were also generated. COMPARISON:  None. FINDINGS: Segmentation: 5 lumbar type vertebral bodies. Alignment: Normal Vertebrae: No fracture or focal lesion at L4 or above. At L5-S1, there are some irregularities of the endplates, more affecting the inferior endplate of L5 than the superior endplate of S1. These are favored to represent degenerative change. However, based on the findings associated with the right ileo psoas musculature, I cannot completely rule out the possibility of discitis at this level. See below. Paraspinal and other soft tissues: Marked enlargement of the right psoas muscle with areas of increased and decreased density. This involves the right iliacus muscle to a lesser degree. The appearance seems more consistent with spontaneous ileo psoas hemorrhage than infection, but I can not completely rule out that possibility. There is not appear to be any more generalized retroperitoneal bleeding or definite inflammatory change at the paravertebral L5-S1 level. There is aortic atherosclerotic calcification. There is a low-density area in the upper pole of the right kidney which is not primarily or completely evaluated but assumed to represent a cyst. 2.7 cm cyst with seen in this region by recent ultrasound. Disc levels: No significant disc space pathology at T12-L1 or L1-2. L2-3: Chronic left-sided disc protrusion with annular calcification. Mild narrowing of the left lateral recess. L3-4: Unremarkable interspace. L4-5: Mild bulging of the disc.  No compressive stenosis. L5-S1: Endplate irregularities and bulging of the disc as described above. Findings are favored to represent degenerative change rather than infectious  discitis, though I can not completely exclude the latter. IMPRESSION: Enlargement of the right psoas muscle and to a lesser extent the upper portion of the right iliacus muscle  with areas of low and high density most consistent with spontaneous intramuscular hematoma. I can not completely rule out the possibility of infection but do not favor that. Some irregularity of the endplates at 075-GRM, favored to be degenerative. Early discitis is not excluded but not favored because of the reasons enumerated above. Mild degenerative changes in the lumbar spine as above, without advanced finding or definite neural compressive stenosis. These results will be called to the ordering clinician or representative by the Radiologist Assistant, and communication documented in the PACS or [employer]. Electronically Signed   By: Nelson Chimes M.D.   On: 10/06/2020 11:46   CT PELVIS WO CONTRAST  Result Date: 10/06/2020 CLINICAL DATA:  RLQ abdomen pain EXAM: CT PELVIS WITHOUT CONTRAST TECHNIQUE: Multidetector CT imaging of the pelvis was performed following the standard protocol without intravenous contrast. COMPARISON:  Same day CT lumbar spine. FINDINGS: Urinary Tract:  The bladder is moderately distended. Bowel:  Unremarkable visualized pelvic bowel loops. Vascular/Lymphatic: There is aortoiliac atherosclerotic disease. No pathologically enlarged lymph nodes. Reproductive:  Unremarkable. Other: There is heterogeneous collection with enlargement of the right psoas muscle, measuring approximately 12.5 x 5.2 x 5.0 cm, and to much lesser extent the upper portion of the right iliacus muscle. Musculoskeletal: No evidence of acute fracture. Symmetric bilateral SI joint degenerative change. There is no osseous destruction is/erosion. There is mild bilateral hip degenerative change. Mild endplate irregularity at L5-S1, see separately dictated lumbar spine CT. There are multiple soft tissue densities along the lower anterior abdomen, likely from medication injection. IMPRESSION: Right psoas muscle enlargement and due to an intramuscular heterogeneous collection measuring approximately 12.5 x 5.2 x 5.0 cm,  most compatible with intramuscular hematoma. Infection is possible. Correlate with laboratory findings. No evidence of acute fracture. Symmetric degenerative changes involving the SI joints. Endplate irregularity at L5-S1 which is favored to be chronic. Given the iliopsoas findings, discitis-osteomyelitis or right SI joint infection cannot be completely excluded on noncontrast CT, but felt to be less likely. MRI would be useful for further evaluation. As noted on separately dictated CT of the lumbar spine, these results will be called to the ordering clinician or representative by the Radiologist Assistant, and communication documented in the PACS or [employer]. Electronically Signed   By: Maurine Simmering   On: 10/06/2020 12:12     LOS: 10 days   Antonieta Pert, MD [facility]  10/07/2020, 1:38 PM

## 2020-10-07 NOTE — NC FL2 (Signed)
[place] LEVEL OF CARE SCREENING TOOL     IDENTIFICATION  Patient Name: Hannah [name] Birthdate: Sep 01, 1965 Sex: female Admission Date (Current Location): 09/26/2020  [facility] and [place] Number:  Herbalist and Address:  [facility], [place] [address] [place], [place] [address]      Provider Number: [id number]  Attending Physician Name and Address:  Antonieta Pert, MD  Relative Name and Phone Number:  sieara, gallik (Daughter)   [phone]    Current Level of Care: Hospital Recommended Level of Care: [place] Prior Approval Number:    Date Approved/Denied:   PASRR Number: [id number]  Discharge Plan: SNF    Current Diagnoses: Patient Active Problem List   Diagnosis Date Noted  . SOB (shortness of breath) 09/27/2020  . Volume overload 09/26/2020  . CKD (chronic kidney disease) stage 5, GFR less than 15 ml/min (HCC) 09/13/2020  . H/O: CVA (cerebrovascular accident) 09/12/2020  . Acute blood loss anemia   . Chronic kidney disease (CKD), stage IV (severe) ([place])   . Uncontrolled type 2 diabetes mellitus with hyperglycemia ([place])   . Essential hypertension   . Right pontine cerebrovascular accident ([place]) 06/12/2020  . Stroke-like symptoms 06/09/2020  . Acute kidney injury superimposed on CKD ([place]) 06/09/2020  . Cerebral thrombosis with cerebral infarction 06/09/2020  . Hypertension   . Hyperlipidemia   . Diabetes type 2, controlled ([place])   . Bimalleolar ankle fracture 11/24/2018  . Multiple sclerosis ([place]) 2009    Orientation RESPIRATION BLADDER Height & Weight     Time,Situation,Place,Self  Normal Incontinent Weight: 127 lb 13.9 oz (58 kg) Height:  '5\' 2"'$  (157.5 cm)  BEHAVIORAL SYMPTOMS/MOOD NEUROLOGICAL BOWEL NUTRITION STATUS      Incontinent Diet (see d/c summary)  AMBULATORY STATUS COMMUNICATION OF NEEDS Skin   Limited Assist Verbally Other (Comment) (right arm incision)                        Personal Care Assistance Level of Assistance  Bathing,Feeding,Dressing Bathing Assistance: Limited assistance Feeding assistance: Independent Dressing Assistance: Limited assistance     Functional Limitations Info  Hearing,Sight,Speech Sight Info: Impaired Hearing Info: Adequate Speech Info: Adequate    SPECIAL CARE FACTORS FREQUENCY  PT (By licensed PT),OT (By licensed OT)     PT Frequency: 5x/week OT Frequency: 5x/week            Contractures Contractures Info: Not present    Additional Factors Info  Code Status,Allergies,Insulin Sliding Scale Code Status Info: Full Allergies Info: Ibuprofen High,  Influenza Vaccines High, Pneumococcal Vaccine High, Promethazine High,  Ambien, Cyclobenzaprine, Tape, Tramadol Hcl,  Oxycodone   Insulin Sliding Scale Info: see medication list       Current Medications (10/07/2020):  This is the current hospital active medication list Current Facility-Administered Medications  Medication Dose Route Frequency Provider Last Rate Last Admin  . 0.9 %  sodium chloride infusion   Intravenous PRN Barbie Banner, PA-C   Stopped at 09/28/20 1354  . acetaminophen (TYLENOL) tablet 650 mg  650 mg Oral Q6H PRN Barbie Banner, PA-C   650 mg at 10/05/20 1435   Or  . acetaminophen (TYLENOL) suppository 650 mg  650 mg Rectal Q6H PRN Barbie Banner, PA-C      . amLODipine (NORVASC) tablet 10 mg  10 mg Oral Daily Barbie Banner, PA-C   10 mg at 10/07/20 [id number]  . carvedilol (COREG) tablet 6.25 mg  6.25 mg Oral BID WC Barbie Banner, PA-C   6.25 mg at 10/07/20 [id number]  . Chlorhexidine Gluconate Cloth 2 % PADS 6 each  6 each Topical Q0600 Barbie Banner, PA-C   6 each at 10/06/20 [id number]  . Chlorhexidine Gluconate Cloth 2 % PADS 6 each  6 each Topical Q0600 Claudia Desanctis, MD   6 each at 10/07/20 [phone]  . Darbepoetin Alfa (ARANESP) injection 200 mcg  200 mcg Intravenous Q Mon-HD Reesa Chew, MD   200 mcg at 10/06/20 1747  . diclofenac Sodium  (VOLTAREN) 1 % topical gel 2 g  2 g Topical QID Kc, Ramesh, MD   2 g at 10/07/20 [id number]  . diphenhydrAMINE (BENADRYL) capsule 25 mg  25 mg Oral Q6H PRN Antonieta Pert, MD   25 mg at 10/05/20 1434  . famotidine (PEPCID) tablet 20 mg  20 mg Oral Daily Barbie Banner, PA-C   20 mg at 10/07/20 [id number]  . FLUoxetine (PROZAC) capsule 20 mg  20 mg Oral Daily Barbie Banner, PA-C   20 mg at 10/07/20 [id number]  . gabapentin (NEURONTIN) capsule 100 mg  100 mg Oral TID Antonieta Pert, MD   100 mg at 10/07/20 [id number]  . hydrALAZINE (APRESOLINE) tablet 50 mg  50 mg Oral Q8H SetzerEdman Circle, PA-C   50 mg at 10/07/20 1409  . HYDROcodone-acetaminophen (NORCO/VICODIN) 5-325 MG per tablet 1 tablet  1 tablet Oral Q4H PRN Antonieta Pert, MD   1 tablet at 10/07/20 0907  . HYDROmorphone (DILAUDID) injection 0.5 mg  0.5 mg Intravenous Q6H PRN Kc, Ramesh, MD   0.5 mg at 10/07/20 1409  . insulin aspart (novoLOG) injection 0-6 Units  0-6 Units Subcutaneous TID WC Barbie Banner, PA-C   2 Units at 10/07/20 1234  . insulin glargine (LANTUS) injection 10 Units  10 Units Subcutaneous QHS Barbie Banner, PA-C   10 Units at 10/06/20 2218  . labetalol (NORMODYNE) injection 20 mg  20 mg Intravenous Q3H PRN Barbie Banner, PA-C   20 mg at 10/06/20 0315  . melatonin tablet 3 mg  3 mg Oral QHS Barbie Banner, PA-C   3 mg at 10/06/20 2114  . methocarbamol (ROBAXIN) tablet 500 mg  500 mg Oral Q6H PRN Antonieta Pert, MD   500 mg at 10/07/20 0907  . metoCLOPramide (REGLAN) injection 10 mg  10 mg Intravenous Q6H PRN Barbie Banner, PA-C   10 mg at 10/03/20 1359  . metoCLOPramide (REGLAN) tablet 5 mg  5 mg Oral BID Barbie Banner, PA-C   5 mg at 10/07/20 [id number]  . ondansetron (ZOFRAN) injection 4 mg  4 mg Intravenous Q6H PRN Barbie Banner, PA-C   4 mg at 10/05/20 2348  . polyethylene glycol (MIRALAX / GLYCOLAX) packet 17 g  17 g Oral Daily PRN Barbie Banner, PA-C   17 g at 10/05/20 1908  . rosuvastatin (CRESTOR) tablet 10 mg  10 mg Oral Daily  Barbie Banner, PA-C   10 mg at 10/07/20 [id number]  . senna-docusate (Senokot-S) tablet 1 tablet  1 tablet Oral BID PRN Barbie Banner, PA-C   1 tablet at 10/03/20 0423  . sodium chloride flush (NS) 0.9 % injection 3 mL  3 mL Intravenous Q12H Barbie Banner, PA-C   3 mL at 10/06/20 2218     Discharge Medications: Please see discharge summary for a list of discharge medications.  Relevant Imaging Results:  Relevant Lab Results:  Additional Information Patient receiving dialysis, SSN: [id number]  Paulene Floor Jeramie Scogin, LCSWA

## 2020-10-07 NOTE — TOC Initial Note (Signed)
Transition of Care [facility]) - Initial/Assessment Note    Patient Details  Name: Hannah [name] MRN: [id number] Date of Birth: 10/26/1965  [facility] CM/SW Contact:    Hannah [name], [place] Phone Number: 10/07/2020, 2:59 PM  Clinical Narrative:                 CSW received consult for possible SNF placement at time of discharge. CSW spoke with patient at bedside.  The patient's daughter was on the phone as well.   Patient was reluctant at first but stated that she would go to a SNF, but only if she was not accepted with the inpatient rehab at the hospital.  Patient did not have an agency preference.     CSW discussed insurance authorization process and provided Medicare SNF ratings list.  No further questions reported at this time.   CSW informed the attending of the patient's request for CIR.   Expected Discharge Plan: Skilled Nursing Facility Barriers to Discharge: Insurance Authorization,Continued Medical Work up,SNF Pending bed offer   Patient Goals and CMS Choice   CMS Medicare.gov Compare Post Acute Care list provided to:: Patient Choice offered to / list presented to : Hannah [name]  Expected Discharge Plan and Services Expected Discharge Plan: [place]       Living arrangements for the past 2 months: Single Family Home                                      Prior Living Arrangements/Services Living arrangements for the past 2 months: Single Family Home Lives with:: Self,Adult Children Patient language and need for interpreter reviewed:: Yes Do you feel safe going back to the place where you live?: Yes      Need for Family Participation in Patient Care: Yes (Comment) Care giver support system in place?: Yes (comment)   Criminal Activity/Legal Involvement Pertinent to Current Situation/Hospitalization: No - Comment as needed  Activities of Daily Living Home Assistive Devices/Equipment: Walker (specify type),Built-in shower  seat ADL Screening (condition at time of admission) Patient's cognitive ability adequate to safely complete daily activities?: Yes Is the patient deaf or have difficulty hearing?: No Does the patient have difficulty seeing, even when wearing glasses/contacts?: No Does the patient have difficulty concentrating, remembering, or making decisions?: No Patient able to express need for assistance with ADLs?: No Does the patient have difficulty dressing or bathing?: No Independently performs ADLs?: No Communication: Independent Dressing (OT): Needs assistance Is this a change from baseline?: Pre-admission baseline Grooming: Needs assistance Is this a change from baseline?: Pre-admission baseline Feeding: Independent Bathing: Independent Toileting: Needs assistance Is this a change from baseline?: Pre-admission baseline In/Out Bed: Needs assistance Is this a change from baseline?: Pre-admission baseline Walks in Home: Needs assistance Is this a change from baseline?: Pre-admission baseline Does the patient have difficulty walking or climbing stairs?: Yes Weakness of Legs: Right Weakness of Arms/Hands: Right  Permission Sought/Granted   Permission granted to share information with : Yes, Release of Information Signed     Permission granted to share info w AGENCY: SNF        Emotional Assessment Appearance:: Appears stated age Attitude/Demeanor/Rapport: Apprehensive Affect (typically observed): Pleasant Orientation: : Oriented to Self,Oriented to Place,Oriented to  Time,Oriented to Situation Alcohol / Substance Use: Not Applicable Psych Involvement: No (comment)  Admission diagnosis:  Peripheral edema [R60.9] Volume overload [E87.70] Acute kidney injury superimposed on  CKD ([place]) [N17.9, N18.9] SOB (shortness of breath) [R06.02] Patient Active Problem List   Diagnosis Date Noted  . SOB (shortness of breath) 09/27/2020  . Volume overload 09/26/2020  . CKD (chronic kidney  disease) stage 5, GFR less than 15 ml/min (HCC) 09/13/2020  . H/O: CVA (cerebrovascular accident) 09/12/2020  . Acute blood loss anemia   . Chronic kidney disease (CKD), stage IV (severe) ([place])   . Uncontrolled type 2 diabetes mellitus with hyperglycemia ([place])   . Essential hypertension   . Right pontine cerebrovascular accident ([place]) 06/12/2020  . Stroke-like symptoms 06/09/2020  . Acute kidney injury superimposed on CKD ([place]) 06/09/2020  . Cerebral thrombosis with cerebral infarction 06/09/2020  . Hypertension   . Hyperlipidemia   . Diabetes type 2, controlled ([place])   . Bimalleolar ankle fracture 11/24/2018  . Multiple sclerosis ([place]) 2009   PCP:  Hannah Shanks, MD Pharmacy:   [facility] [address] [place] - [address] [place] [place] [address] Phone: [phone] Fax: [phone]     Social Determinants of Health (SDOH) Interventions    Readmission Risk Interventions No flowsheet data found.

## 2020-10-07 NOTE — Progress Notes (Signed)
Inpatient Rehab Admissions Coordinator:   Consult received per family request.  Difficult to determine whether she would benefit from CIR, or not.  Majority of therapy documentation has pt performing mobility and ADLs at min guard level or better, which is her baseline.  Most recent PT note from 5/23, pt declined any out of bed mobility.  I'm also not confident that [facility] Medicare would approve pt for CIR based on diagnosis at this time, though we could certainly try if she participates with therapy and demonstrates a significant functional decline from her baseline.  I will follow for progress with therapy.   Shann Medal, PT, DPT Admissions Coordinator [phone] 10/07/20  3:39 PM

## 2020-10-07 NOTE — Progress Notes (Signed)
Nephrology Progress Note:    Subjective:  no UOP charted over 5/23.  Last HD on 5/23 with 2.5 kg UF.  Per CT on 5/23 she had enlargement of right psoas muscle hematoma - primary team is managing.  She states that she is going to be going somewhere for therapy -doesn't know where yet.  She hasn't been able to really walk on her right leg - in a lot of pain from the hematoma.   Review of systems:    Denies shortness of breath or chest pain Denies n/v  Objective Vital signs in last 24 hours: Vitals:   10/06/20 1809 10/06/20 1903 10/07/20 0521 10/07/20 0813  BP: (!) 147/71 (!) 169/62 (!) 179/83 (!) 173/81  Pulse: 94 81 (!) 101 100  Resp: '18 19 18 17  '$ Temp: 98.2 F (36.8 C) 97.8 F (36.6 C) 98.2 F (36.8 C) 98.2 F (36.8 C)  TempSrc: Oral Oral  Oral  SpO2: 98% 98% 95% 93%  Weight: 56 kg  58 kg   Height:       Weight change:   Intake/Output Summary (Last 24 hours) at 10/07/2020 1023 Last data filed at 10/06/2020 1750 Gross per 24 hour  Intake --  Output 2507 ml  Net -2507 ml    Assessment/Plan: [name] with DM and HTN with progressive CKD and uremic symptoms   1.ESRD - CKD progressed to ESRD.  S/p AVF 5/19; appreciate help from [facility].   - Continue dialysis on MWF schedule while inpatient  - She had been arranged to start at [facility] for HD however doesn't appear very mobile so dispo uncertain.  Spoke with HD SW - please avoid morphine given ESRD   2. Hypertension/volume  - improved with HD and medication titration.  Missed her amlodipine on 5/23 as well as a dose of hydralazine  3. Anemia CKD and iron deficiency.  s/p repletion w/ 1g of iron and aranesp was just increased to 200 mcg weekly on mondays.  Hb 9.1    4. Metabolic bone disease-  PTH 84. No treatment needed. Phos acceptable - controlled with HD  5. Metabolic Acidosis- managed with HD  Disposition - per primary team.  If she is to discharge to SNF (and right now is not mobile) will need to  transition to another unit for hemodialysis.  Spoke with HD SW to update and we are looking at other units   Labs: Basic Metabolic Panel: Recent Labs  Lab 10/05/20 0239 10/06/20 0246 10/07/20 0831  NA 129* 128* 131*  K 3.3* 3.2* 4.0  CL 95* 93* 96*  CO2 '24 23 26  '$ GLUCOSE 216* 165* 196*  BUN '9 17 14  '$ CREATININE 4.03* 5.35* 4.04*  CALCIUM 8.4* 8.6* 8.3*  PHOS 3.0 3.5 3.1   Liver Function Tests: Recent Labs  Lab 10/05/20 0239 10/06/20 0246 10/07/20 0831  ALBUMIN 2.4* 2.8* 2.6*   CBC: Recent Labs  Lab 10/04/20 0423 10/05/20 0239 10/05/20 0626 10/05/20 2231 10/06/20 0246 10/06/20 1954 10/07/20 0831  WBC 8.3 8.2 8.8  --  10.2  --  9.8  HGB 8.1* 6.8* 7.0*   < > 9.1* 9.3* 9.1*  HCT 25.0* 21.2* 21.9*   < > 27.7* 28.0* 27.1*  MCV 92.3 92.2 90.9  --  90.8  --  90.3  PLT 199 190 181  --  201  --  198   < > = values in this interval not displayed.   CBG: Recent Labs  Lab 10/06/20  [id number] 10/06/20 1210 10/06/20 1859 10/06/20 2025 10/07/20 0829  GLUCAP 141* 132* 185* 237* 197*    Studies/Results: CT LUMBAR SPINE WO CONTRAST  Result Date: 10/06/2020 CLINICAL DATA:  Low back pain and right-sided abdominal pain. EXAM: CT LUMBAR SPINE WITHOUT CONTRAST TECHNIQUE: Multidetector CT imaging of the lumbar spine was performed without intravenous contrast administration. Multiplanar CT image reconstructions were also generated. COMPARISON:  None. FINDINGS: Segmentation: 5 lumbar type vertebral bodies. Alignment: Normal Vertebrae: No fracture or focal lesion at L4 or above. At L5-S1, there are some irregularities of the endplates, more affecting the inferior endplate of L5 than the superior endplate of S1. These are favored to represent degenerative change. However, based on the findings associated with the right ileo psoas musculature, I cannot completely rule out the possibility of discitis at this level. See below. Paraspinal and other soft tissues: Marked enlargement of the right  psoas muscle with areas of increased and decreased density. This involves the right iliacus muscle to a lesser degree. The appearance seems more consistent with spontaneous ileo psoas hemorrhage than infection, but I can not completely rule out that possibility. There is not appear to be any more generalized retroperitoneal bleeding or definite inflammatory change at the paravertebral L5-S1 level. There is aortic atherosclerotic calcification. There is a low-density area in the upper pole of the right kidney which is not primarily or completely evaluated but assumed to represent a cyst. 2.7 cm cyst with seen in this region by recent ultrasound. Disc levels: No significant disc space pathology at T12-L1 or L1-2. L2-3: Chronic left-sided disc protrusion with annular calcification. Mild narrowing of the left lateral recess. L3-4: Unremarkable interspace. L4-5: Mild bulging of the disc.  No compressive stenosis. L5-S1: Endplate irregularities and bulging of the disc as described above. Findings are favored to represent degenerative change rather than infectious discitis, though I can not completely exclude the latter. IMPRESSION: Enlargement of the right psoas muscle and to a lesser extent the upper portion of the right iliacus muscle with areas of low and high density most consistent with spontaneous intramuscular hematoma. I can not completely rule out the possibility of infection but do not favor that. Some irregularity of the endplates at 075-GRM, favored to be degenerative. Early discitis is not excluded but not favored because of the reasons enumerated above. Mild degenerative changes in the lumbar spine as above, without advanced finding or definite neural compressive stenosis. These results will be called to the ordering clinician or representative by the Radiologist Assistant, and communication documented in the PACS or [employer]. Electronically Signed   By: Nelson Chimes M.D.   On: 10/06/2020 11:46   CT  PELVIS WO CONTRAST  Result Date: 10/06/2020 CLINICAL DATA:  RLQ abdomen pain EXAM: CT PELVIS WITHOUT CONTRAST TECHNIQUE: Multidetector CT imaging of the pelvis was performed following the standard protocol without intravenous contrast. COMPARISON:  Same day CT lumbar spine. FINDINGS: Urinary Tract:  The bladder is moderately distended. Bowel:  Unremarkable visualized pelvic bowel loops. Vascular/Lymphatic: There is aortoiliac atherosclerotic disease. No pathologically enlarged lymph nodes. Reproductive:  Unremarkable. Other: There is heterogeneous collection with enlargement of the right psoas muscle, measuring approximately 12.5 x 5.2 x 5.0 cm, and to much lesser extent the upper portion of the right iliacus muscle. Musculoskeletal: No evidence of acute fracture. Symmetric bilateral SI joint degenerative change. There is no osseous destruction is/erosion. There is mild bilateral hip degenerative change. Mild endplate irregularity at L5-S1, see separately dictated lumbar spine CT. There are multiple  soft tissue densities along the lower anterior abdomen, likely from medication injection. IMPRESSION: Right psoas muscle enlargement and due to an intramuscular heterogeneous collection measuring approximately 12.5 x 5.2 x 5.0 cm, most compatible with intramuscular hematoma. Infection is possible. Correlate with laboratory findings. No evidence of acute fracture. Symmetric degenerative changes involving the SI joints. Endplate irregularity at L5-S1 which is favored to be chronic. Given the iliopsoas findings, discitis-osteomyelitis or right SI joint infection cannot be completely excluded on noncontrast CT, but felt to be less likely. MRI would be useful for further evaluation. As noted on separately dictated CT of the lumbar spine, these results will be called to the ordering clinician or representative by the Radiologist Assistant, and communication documented in the PACS or [employer]. Electronically Signed    By: Maurine Simmering   On: 10/06/2020 12:12    Medications: Infusions: . sodium chloride Stopped (09/28/20 1354)    Scheduled Medications: . amLODipine  10 mg Oral Daily  . carvedilol  6.25 mg Oral BID WC  . Chlorhexidine Gluconate Cloth  6 each Topical Q0600  . Chlorhexidine Gluconate Cloth  6 each Topical Q0600  . darbepoetin (ARANESP) injection - DIALYSIS  200 mcg Intravenous Q Mon-HD  . diclofenac Sodium  2 g Topical QID  . famotidine  20 mg Oral Daily  . FLUoxetine  20 mg Oral Daily  . gabapentin  100 mg Oral TID  . hydrALAZINE  50 mg Oral Q8H  . insulin aspart  0-6 Units Subcutaneous TID WC  . insulin glargine  10 Units Subcutaneous QHS  . melatonin  3 mg Oral QHS  . metoCLOPramide  5 mg Oral BID  . rosuvastatin  10 mg Oral Daily  . sodium chloride flush  3 mL Intravenous Q12H    have reviewed scheduled and prn medications.  Vitals:   10/07/20 0521 10/07/20 0813  BP: (!) 179/83 (!) 173/81  Pulse: (!) 101 100  Resp: 18 17  Temp: 98.2 F (36.8 C) 98.2 F (36.8 C)  SpO2: 95% 93%    Physical exam: General adult female in bed in no acute distress at rest HEENT normocephalic atraumatic extraocular movements intact sclera anicteric Neck supple trachea midline Lungs clear to auscultation bilaterally normal work of breathing at rest  Heart S1S2 no rub Abdomen soft nontender nondistended Extremities no edema  Psych normal mood and affect Neuro lying in bed on my exam Access RIJ tunn catheter; right AVF bruit  Claudia Desanctis, MD 10/07/2020, 10:36 AM

## 2020-10-07 NOTE — Plan of Care (Signed)

## 2020-10-08 ENCOUNTER — Other Ambulatory Visit: Payer: Self-pay | Admitting: *Deleted

## 2020-10-08 DIAGNOSIS — E8779 Other fluid overload: Secondary | ICD-10-CM | POA: Diagnosis not present

## 2020-10-08 LAB — RENAL FUNCTION PANEL
Albumin: 2.5 g/dL — ABNORMAL LOW (ref 3.5–5.0)
Anion gap: 13 (ref 5–15)
BUN: 24 mg/dL — ABNORMAL HIGH (ref 6–20)
CO2: 22 mmol/L (ref 22–32)
Calcium: 8.2 mg/dL — ABNORMAL LOW (ref 8.9–10.3)
Chloride: 93 mmol/L — ABNORMAL LOW (ref 98–111)
Creatinine, Ser: 5.5 mg/dL — ABNORMAL HIGH (ref 0.44–1.00)
GFR, Estimated: 9 mL/min — ABNORMAL LOW (ref >60)
Glucose, Bld: 242 mg/dL — ABNORMAL HIGH (ref 70–99)
Phosphorus: 3.2 mg/dL (ref 2.5–4.6)
Potassium: 3.9 mmol/L (ref 3.5–5.1)
Sodium: 128 mmol/L — ABNORMAL LOW (ref 135–145)

## 2020-10-08 LAB — CBC
HCT: 27.3 % — ABNORMAL LOW (ref 36.0–46.0)
Hemoglobin: 8.6 g/dL — ABNORMAL LOW (ref 12.0–15.0)
MCH: 29.8 pg (ref 26.0–34.0)
MCHC: 31.5 g/dL (ref 30.0–36.0)
MCV: 94.5 fL (ref 80.0–100.0)
Platelets: 195 10*3/uL (ref 150–400)
RBC: 2.89 MIL/uL — ABNORMAL LOW (ref 3.87–5.11)
RDW: 17.3 % — ABNORMAL HIGH (ref 11.5–15.5)
WBC: 10.1 10*3/uL (ref 4.0–10.5)
nRBC: 0.7 % — ABNORMAL HIGH (ref 0.0–0.2)

## 2020-10-08 LAB — GLUCOSE, CAPILLARY
Glucose-Capillary: 196 mg/dL — ABNORMAL HIGH (ref 70–99)
Glucose-Capillary: 220 mg/dL — ABNORMAL HIGH (ref 70–99)
Glucose-Capillary: 154 mg/dL — ABNORMAL HIGH (ref 70–99)
Glucose-Capillary: 237 mg/dL — ABNORMAL HIGH (ref 70–99)
Glucose-Capillary: 212 mg/dL — ABNORMAL HIGH (ref 70–99)

## 2020-10-08 MED ORDER — INSULIN GLARGINE 100 UNIT/ML ~~LOC~~ SOLN
12.0000 [IU] | Freq: Every day | SUBCUTANEOUS | Status: DC
  Administered 2020-10-08 – 2020-10-09 (×2): 12 [IU] via SUBCUTANEOUS
  Filled 2020-10-08 (×3): qty 0.12

## 2020-10-08 MED ORDER — TIZANIDINE HCL 4 MG PO TABS
2.0000 mg | ORAL_TABLET | Freq: Three times a day (TID) | ORAL | Status: DC
  Administered 2020-10-08 – 2020-10-18 (×31): 2 mg via ORAL
  Filled 2020-10-08 (×32): qty 1

## 2020-10-08 MED ORDER — HEPARIN SODIUM (PORCINE) 1000 UNIT/ML IJ SOLN
INTRAMUSCULAR | Status: AC
  Administered 2020-10-08: 1000 [IU]
  Filled 2020-10-08: qty 4

## 2020-10-08 MED ORDER — HYDROMORPHONE HCL 1 MG/ML IJ SOLN
INTRAMUSCULAR | Status: AC
  Administered 2020-10-08: 0.5 mg via INTRAVENOUS
  Filled 2020-10-08: qty 0.5

## 2020-10-08 NOTE — Progress Notes (Signed)
Nephrology Progress Note:    Subjective:   Seen and examined on dialysis.  Blood pressure 125/68 and HR 114. UF goal reduced with tachycardia.  Tunn catheter in use.  Had 0.5 kg UOP charted today.  She states she still hasn't been able to really walk on her right leg - still in a lot of pain from the hematoma.     Review of systems:    Denies shortness of breath or chest pain Denies n/v  Objective Vital signs in last 24 hours: Vitals:   10/08/20 0857 10/08/20 0900 10/08/20 0930 10/08/20 1000  BP: (!) 168/77 128/67 117/76 (!) 125/38  Pulse: 100 98 (!) 102 (!) 114  Resp: 12     Temp: 98.1 F (36.7 C)     TempSrc: Oral     SpO2: 97%     Weight: 55.5 kg     Height:       Weight change: 5.1 kg  Intake/Output Summary (Last 24 hours) at 10/08/2020 1010 Last data filed at 10/08/2020 0800 Gross per 24 hour  Intake 240 ml  Output 500 ml  Net -260 ml    Assessment/Plan: [name] with DM and HTN with progressive CKD and uremic symptoms   1.ESRD - CKD progressed to ESRD.  S/p AVF 5/19; appreciate help from [facility].   - Continue dialysis on MWF schedule while inpatient  - please avoid morphine given ESRD  - HD SW is getting an updated outpatient dialysis unit  2. Hypertension/volume  - improved with HD and on current regimen   3. Anemia CKD and iron deficiency.  s/p repletion w/ 1g of iron and aranesp was just increased to 200 mcg weekly on mondays   4. Metabolic bone disease-  PTH 84. No treatment needed. Phos acceptable - controlled with HD  5. Metabolic Acidosis- managed with HD  Disposition - per primary team.  At this time she does not have an outpatient dialysis unit.  (if she is to discharge to SNF no longer appropriate for TCU; she has had a decline in functional status).  HD SW has reached out to other units   Labs: Basic Metabolic Panel: Recent Labs  Lab 10/06/20 0246 10/07/20 0831 10/08/20 0106  NA 128* 131* 128*  K 3.2* 4.0 3.9  CL 93* 96* 93*  CO2 '23 26  22  '$ GLUCOSE 165* 196* 242*  BUN 17 14 24*  CREATININE 5.35* 4.04* 5.50*  CALCIUM 8.6* 8.3* 8.2*  PHOS 3.5 3.1 3.2   Liver Function Tests: Recent Labs  Lab 10/06/20 0246 10/07/20 0831 10/08/20 0106  ALBUMIN 2.8* 2.6* 2.5*   CBC: Recent Labs  Lab 10/05/20 0239 10/05/20 0626 10/05/20 2231 10/06/20 0246 10/06/20 1954 10/07/20 0831 10/07/20 1634 10/08/20 0106  WBC 8.2 8.8  --  10.2  --  9.8  --  10.1  HGB 6.8* 7.0*   < > 9.1*   < > 9.1* 8.8* 8.6*  HCT 21.2* 21.9*   < > 27.7*   < > 27.1* 27.2* 27.3*  MCV 92.2 90.9  --  90.8  --  90.3  --  94.5  PLT 190 181  --  201  --  198  --  195   < > = values in this interval not displayed.   CBG: Recent Labs  Lab 10/07/20 1122 10/07/20 1610 10/07/20 2151 10/08/20 0707 10/08/20 0754  GLUCAP 209* 280* 252* 196* 220*    Studies/Results: CT LUMBAR SPINE WO CONTRAST  Result Date: 10/06/2020  CLINICAL DATA:  Low back pain and right-sided abdominal pain. EXAM: CT LUMBAR SPINE WITHOUT CONTRAST TECHNIQUE: Multidetector CT imaging of the lumbar spine was performed without intravenous contrast administration. Multiplanar CT image reconstructions were also generated. COMPARISON:  None. FINDINGS: Segmentation: 5 lumbar type vertebral bodies. Alignment: Normal Vertebrae: No fracture or focal lesion at L4 or above. At L5-S1, there are some irregularities of the endplates, more affecting the inferior endplate of L5 than the superior endplate of S1. These are favored to represent degenerative change. However, based on the findings associated with the right ileo psoas musculature, I cannot completely rule out the possibility of discitis at this level. See below. Paraspinal and other soft tissues: Marked enlargement of the right psoas muscle with areas of increased and decreased density. This involves the right iliacus muscle to a lesser degree. The appearance seems more consistent with spontaneous ileo psoas hemorrhage than infection, but I can not  completely rule out that possibility. There is not appear to be any more generalized retroperitoneal bleeding or definite inflammatory change at the paravertebral L5-S1 level. There is aortic atherosclerotic calcification. There is a low-density area in the upper pole of the right kidney which is not primarily or completely evaluated but assumed to represent a cyst. 2.7 cm cyst with seen in this region by recent ultrasound. Disc levels: No significant disc space pathology at T12-L1 or L1-2. L2-3: Chronic left-sided disc protrusion with annular calcification. Mild narrowing of the left lateral recess. L3-4: Unremarkable interspace. L4-5: Mild bulging of the disc.  No compressive stenosis. L5-S1: Endplate irregularities and bulging of the disc as described above. Findings are favored to represent degenerative change rather than infectious discitis, though I can not completely exclude the latter. IMPRESSION: Enlargement of the right psoas muscle and to a lesser extent the upper portion of the right iliacus muscle with areas of low and high density most consistent with spontaneous intramuscular hematoma. I can not completely rule out the possibility of infection but do not favor that. Some irregularity of the endplates at 075-GRM, favored to be degenerative. Early discitis is not excluded but not favored because of the reasons enumerated above. Mild degenerative changes in the lumbar spine as above, without advanced finding or definite neural compressive stenosis. These results will be called to the ordering clinician or representative by the Radiologist Assistant, and communication documented in the PACS or [employer]. Electronically Signed   By: Nelson Chimes M.D.   On: 10/06/2020 11:46   CT PELVIS WO CONTRAST  Result Date: 10/06/2020 CLINICAL DATA:  RLQ abdomen pain EXAM: CT PELVIS WITHOUT CONTRAST TECHNIQUE: Multidetector CT imaging of the pelvis was performed following the standard protocol without  intravenous contrast. COMPARISON:  Same day CT lumbar spine. FINDINGS: Urinary Tract:  The bladder is moderately distended. Bowel:  Unremarkable visualized pelvic bowel loops. Vascular/Lymphatic: There is aortoiliac atherosclerotic disease. No pathologically enlarged lymph nodes. Reproductive:  Unremarkable. Other: There is heterogeneous collection with enlargement of the right psoas muscle, measuring approximately 12.5 x 5.2 x 5.0 cm, and to much lesser extent the upper portion of the right iliacus muscle. Musculoskeletal: No evidence of acute fracture. Symmetric bilateral SI joint degenerative change. There is no osseous destruction is/erosion. There is mild bilateral hip degenerative change. Mild endplate irregularity at L5-S1, see separately dictated lumbar spine CT. There are multiple soft tissue densities along the lower anterior abdomen, likely from medication injection. IMPRESSION: Right psoas muscle enlargement and due to an intramuscular heterogeneous collection measuring approximately 12.5 x 5.2  x 5.0 cm, most compatible with intramuscular hematoma. Infection is possible. Correlate with laboratory findings. No evidence of acute fracture. Symmetric degenerative changes involving the SI joints. Endplate irregularity at L5-S1 which is favored to be chronic. Given the iliopsoas findings, discitis-osteomyelitis or right SI joint infection cannot be completely excluded on noncontrast CT, but felt to be less likely. MRI would be useful for further evaluation. As noted on separately dictated CT of the lumbar spine, these results will be called to the ordering clinician or representative by the Radiologist Assistant, and communication documented in the PACS or [employer]. Electronically Signed   By: Maurine Simmering   On: 10/06/2020 12:12    Medications: Infusions: . sodium chloride Stopped (09/28/20 1354)    Scheduled Medications: . amLODipine  10 mg Oral Daily  . carvedilol  6.25 mg Oral BID WC  .  Chlorhexidine Gluconate Cloth  6 each Topical Q0600  . Chlorhexidine Gluconate Cloth  6 each Topical Q0600  . Chlorhexidine Gluconate Cloth  6 each Topical Q0600  . darbepoetin (ARANESP) injection - DIALYSIS  200 mcg Intravenous Q Mon-HD  . diclofenac Sodium  2 g Topical QID  . famotidine  20 mg Oral Daily  . FLUoxetine  20 mg Oral Daily  . gabapentin  100 mg Oral TID  . heparin sodium (porcine)      . hydrALAZINE  50 mg Oral Q8H  . insulin aspart  0-6 Units Subcutaneous TID WC  . insulin glargine  10 Units Subcutaneous QHS  . melatonin  3 mg Oral QHS  . metoCLOPramide  5 mg Oral BID  . rosuvastatin  10 mg Oral Daily  . sodium chloride flush  3 mL Intravenous Q12H  . tiZANidine  2 mg Oral TID    have reviewed scheduled and prn medications.  Vitals:   10/08/20 0930 10/08/20 1000  BP: 117/76 (!) 125/38  Pulse: (!) 102 (!) 114  Resp:    Temp:    SpO2:      Physical exam:  General adult female in bed in no acute distress at rest HEENT normocephalic atraumatic sclera anicteric Neck supple trachea midline Lungs clear to auscultation bilaterally normal work of breathing at rest  Heart S1S2 no rub Abdomen soft nontender nondistended Extremities no edema  Psych normal mood and affect Neuro lying in bed on my exam Access RIJ tunn catheter; right AVF bruit  Claudia Desanctis, MD 10/08/2020, 10:26 AM

## 2020-10-08 NOTE — Progress Notes (Signed)
Inpatient Diabetes Program Recommendations  AACE/ADA: New Consensus Statement on Inpatient Glycemic Control (2015)  Target Ranges:  Prepandial:   less than 140 mg/dL      Peak postprandial:   less than 180 mg/dL (1-2 hours)      Critically ill patients:  140 - 180 mg/dL   Lab Results  Component Value Date   GLUCAP 154 (H) 10/08/2020   HGBA1C 8.7 (H) 09/13/2020    Review of Glycemic Control Results for MARITHZA, MATTEUCCI (MRN [id number]) as of 10/08/2020 13:35  Ref. Range 10/07/2020 16:10 10/07/2020 21:51 10/08/2020 07:07 10/08/2020 07:54 10/08/2020 13:09  Glucose-Capillary Latest Ref Range: 70 - 99 mg/dL 280 (H) 252 (H) 196 (H) 220 (H) 154 (H)   Diabetes history:  DM 2 Outpatient Diabetes medications:  Glucotrol 10 mg bid, Tresiba 15-45 units q HS, Lispro 10 units tid with meals Current orders for Inpatient glycemic control:  Novolog 0-6 units tid with meals Lantus 10 units q HS  Inpatient Diabetes Program Recommendations:    Consider slight increase of Lantus to 12 units q HS.   Thanks  Adah Perl, RN, BC-ADM Inpatient Diabetes Coordinator Pager [phone] (8a-5p)

## 2020-10-08 NOTE — Patient Outreach (Signed)
[name] Care Management  10/08/2020  Hannah [name] 1965/06/01 [id number]   Care Coordination  Case Closure  Referral Date :09/18/20 Referral [facility] Liaison Referral Reason:[facility] Disease Management , Diabetes, CKD Insurance:[employer]    55 year old female with PMH CKD4, Diabetes type 2, A1 8.7% on 09/13/20, CVA left Hemiparesis 06/09/20 , Dyslipidemia.   Subjective: Patient was readmitted to [facility] 5/13 for Peripheral edema Acute Kidney injury on CKD, volume overload.  Patient remains admitted greater than 10 days.    Plan Per [facility] CM workflow patient admitted greater than 10 days, will plan case closure . Will follow up with [facility] hospital Liaison for discharge disposition and plans.    Joylene Draft, RN, BSN  [facility] Management Coordinator  [phone]- Mobile [phone]- Toll Free Main Office

## 2020-10-08 NOTE — Progress Notes (Signed)
PT Cancellation Note  Patient Details Name: [name] MRN: [id number] DOB: [birth year]   Cancelled Treatment:    Reason Eval/Treat Not Completed: (P) Patient at procedure or test/unavailable (off unit for HD will f/u per POC.)   Thessaly Mccullers J Stann Mainland 10/08/2020, 10:56 AM  Erasmo Leventhal , PTA Acute Rehabilitation Services Pager [phone] Office [phone]

## 2020-10-08 NOTE — TOC Progression Note (Signed)
Transition of Care [facility]) - Progression Note    Patient Details  Name: Hannah [name] MRN: [id number] Date of Birth: [birth year]  Transition of Care [facility]) CM/SW Contact  Milinda Antis, [place] Phone Number: 10/08/2020, 3:32 PM  Clinical Narrative:     CSW met with the patient and daughter at bedside.  The patient was visibly in pain, upset about her current medical condition and crying at times.  CSW provided supportive counseling to the patient durin this time.  Discharge plans were discussed.  The patient stated that she only wants to go to CIR.  CSW informed the patient of the insurance authorization process.    Expected Discharge Plan: [place] Barriers to Discharge: [place] Work up,SNF Pending bed offer  Expected Discharge Plan and Services Expected Discharge Plan: [place] arrangements for the past 2 months: Single Family Home                                       Social Determinants of Health (SDOH) Interventions    Readmission Risk Interventions No flowsheet data found.

## 2020-10-08 NOTE — Plan of Care (Signed)
  Problem: Education: Goal: Knowledge of General Education information will improve Description Including pain rating scale, medication(s)/side effects and non-pharmacologic comfort measures Outcome: Progressing   

## 2020-10-08 NOTE — Progress Notes (Signed)
Inpatient Rehab Admissions Coordinator:   I met with patient and her daughter, [name], at bedside to discuss recommendations for CIR.  Pt has previously been admitted to our facility earlier this year for a CVA.  Currently admitted for volume overload in the setting of CKD V, now declared ESRD and on HD MWF.  I reviewed CIR goals/expectations.  Pt was mobilizing at home with family very well following her previous admission to CIR.  She did require min assist, but reports no falls since discharge.  She ambulates with a RW.  I explained insurance authorization process, and both are in agreement.  I have started insurance auth and will continue to follow.   Shann Medal, PT, DPT Admissions Coordinator [phone] 10/08/20  3:32 PM

## 2020-10-08 NOTE — Progress Notes (Signed)
PROGRESS NOTE    Hannah [name]  [id number] DOB: Mar 26, 1966 DOA: 09/26/2020 PCP: Vernie Shanks, MD   Chief Complaint  Patient presents with  . Leg Swelling  Brief Narrative: 55 year old female with CKD 5, anemia, CVA, hypertension, diabetes, hyperlipidemia, multiple sclerosis presents with shortness of breath, lower extremity swelling, onset 2 to 3 days prior to admission.  Patient was seen by PCP and sent to the ED for evaluation.  Recently admitted 2 weeks ago for AKI on CKD 4-renal ultrasound was unremarkable believed to be progression of her CKD, her blood pressure medication were adjusted and was planning for nephrology outpatient follow-up. She was seen in the ED creatinine 5.3 similar to previous with anemia elevated BNP DVT study was negative chest x-ray showed ill-defined opacity consistent with pneumonia in the right middle and lower lobes and some atelectasis.  Nephrology was consulted with her volume overload likely approaching ESRD, patient was admitted for further management. Seen by nephrology started on hemodialysis-now on Monday Wednesday Friday schedule. Outpatient dialysis has been set up starting 5/23 at TCU. Patient was complaining of right flank and right thigh pain and CT scan showed right psoas hematoma Patient has been weak with a spasm like pain and deconditioned, PT OT working with her  Subjective: Seen in dialysis this morning.  Still complains of right leg spasm and pain.   Denies nausea vomiting fever chills.   On room air.    Assessment & Plan:  Longstanding CKD that has progressed to ESRD w/ Volume overload Uremia/metabolic acidosis: Now on HD MWF, status post aVF 5/19.  Nephrology following closely continue dialysis on HD this morning. Recent Labs  Lab 10/04/20 0423 10/05/20 0239 10/06/20 0246 10/07/20 0831 10/08/20 0106  BUN <5* '9 17 14 '$ 24*  CREATININE 2.62* 4.03* 5.35* [id number]* [id number]*   Metabolic bone disease PTH 84 no treatment needed  phosphorus acceptable-on HD.  Essential hypertension,BP on higher side in dialysis.  Patient on multiple medication including amlodipine 10 mg carvedilol 6.25 mg  Hydralazine 50 mg.  Continue.Marland Kitchen  Hypokalemia monitor  Type 2 diabetes mellitus hemoglobin A1c 8.7 most recently.  Sugar borderline controlled on Lantus 10 units and sliding scale. . At home on Tresiba  And also insulin 10 units 3 times daily. Home glipizide discontinued. Recent Labs  Lab 10/07/20 1122 10/07/20 1610 10/07/20 2151 10/08/20 0707 10/08/20 0754  GLUCAP 209* 280* 252* 196* 220*   History of  Recent CVA in jan 2022 w/ Rt sided spastic weakness/Hyperlipidemia:Continue on Crestor.Plavix was resumed after fistula surgery but holding 10/06/20-due to psoas hematoma and anemia. Based on previous documentation she was having dysarthric speech since jan 2022. CT head this admission shows moderate extensive white matter hypodensity bilaterally similar to prior CT and MRI may represent demyelinating disease with history of multiple sclerosis.She will need follow-up with neurology as outpatient as planned before-this was discussed with patient's daughter.  Acute blood loss anemia needing transfusion Anemia of chronic disease/renal disease,managed by ferric gluconate,ESA by nephrology. Hb dropped to 6.8 g 5/22-transfused 1 unit PRBC and hemoglobin has been stable since.    Recent Labs  Lab 10/06/20 0246 10/06/20 1954 10/07/20 0831 10/07/20 1634 10/08/20 0106  HGB 9.1* 9.3* 9.1* 8.8* 8.6*  HCT 27.7* 28.0* 27.1* 27.2* 27.3*   Right flank pain/Rt groin pain/Rt psoas enlargement with right psoas hematoma: CT pelvis/lumbar spine- showed right psoas enlargement most compatible with intramuscular hematoma infection possible but at this time patient does not have any fever spike or leukocytosis. I discussed with  vascular surgery-conservative management holding Plavix.  I discussed with Dr. Donne Hazel from general surgery advised to  continue with pain management antispasmodic hold Plavix for 2 weeks and no surgery needed. Stop Neurontin, on oral and IV opiates, add tizanidine scheduled, also on prn robaxin  Left abdomen wall hematoma at heparin inj site- off heparin.  Monitor  Multiple sclerosis:??MS for years as per daughter patient was supposed to have a follow-up with Dr. Leonie Man from last discharge back in February but it appears she has not followed up. I made an internal referral again to neurology Dr Leonie Man. CT head this admission shows similar white matter hypodensity bilaterally. Diet Order            Diet renal/carb modified with fluid restriction Diet-HS Snack? Nothing; Fluid restriction: 1200 mL Fluid; Room service appropriate? Yes; Fluid consistency: Thin  Diet effective now               Patient's Body mass index is 21.57 kg/m. DVT prophylaxis:  Code Status:   Code Status: Full Code   Family Communication: plan of care discussed with patient at bedside. Plan of care was extensively discussed with patient's daughters and had updated after CT scan result previously.  Status is: Inpatient. Remains inpatient appropriate because:IV treatments appropriate due to intensity of illness or inability to take PO and Inpatient level of care appropriate due to severity of illness  Dispo: The patient is from: Home lives with the daughter             Anticipated d/c: Looking into CIR              Patient currently is not medically stable to d/c.   Difficult to place patient No  Unresulted Labs (From admission, onward)          Start     Ordered   09/28/20 0500  Renal function panel  Daily,   R      09/27/20 1218   Signed and Held  Hepatitis B surface antigen  (New Patient Hemo Labs (Hepatitis B))  Once,   R        Signed and Held   Signed and Held  Hepatitis B surface antibody  (New Patient Hemo Labs (Hepatitis B))  Once,   R        Signed and Held   Signed and Held  Hepatitis B core antibody, total  (New  Patient Hemo Labs (Hepatitis B))  Once,   R        Signed and Held   Signed and Held  Hepatitis C antibody  (New Patient Hemo Labs (Hepatitis B))  Once,   R        Signed and Held         Medications reviewed:  Scheduled Meds: . amLODipine  10 mg Oral Daily  . carvedilol  6.25 mg Oral BID WC  . Chlorhexidine Gluconate Cloth  6 each Topical Q0600  . Chlorhexidine Gluconate Cloth  6 each Topical Q0600  . Chlorhexidine Gluconate Cloth  6 each Topical Q0600  . darbepoetin (ARANESP) injection - DIALYSIS  200 mcg Intravenous Q Mon-HD  . diclofenac Sodium  2 g Topical QID  . famotidine  20 mg Oral Daily  . FLUoxetine  20 mg Oral Daily  . gabapentin  100 mg Oral TID  . hydrALAZINE  50 mg Oral Q8H  . insulin aspart  0-6 Units Subcutaneous TID WC  . insulin glargine  10 Units Subcutaneous QHS  .  melatonin  3 mg Oral QHS  . metoCLOPramide  5 mg Oral BID  . rosuvastatin  10 mg Oral Daily  . sodium chloride flush  3 mL Intravenous Q12H  . tiZANidine  2 mg Oral TID   Continuous Infusions: . sodium chloride Stopped (09/28/20 1354)    Consultants: Right upper extremity AV fistula placement  Procedures: Nephrology Vascular surgery  Antimicrobials: Anti-infectives (From admission, onward)   Start     Dose/Rate Route Frequency Ordered Stop   10/02/20 1049  ceFAZolin (ANCEF) 2-4 GM/100ML-% IVPB       Note to Pharmacy: Claybon Jabs   : cabinet override      10/02/20 1049 10/02/20 2259   09/29/20 1330  ceFAZolin (ANCEF) 2-4 GM/100ML-% IVPB       Note to Pharmacy: Cecile Sheerer   : cabinet override      09/29/20 1330 09/30/20 0144   09/29/20 0000  ceFAZolin (ANCEF) 1 g in sodium chloride 0.9 % 100 mL IVPB        1 g 200 mL/hr over 30 Minutes Intravenous To Radiology 09/27/20 1457 09/29/20 1419     Culture/Microbiology No results found for: SDES, SPECREQUEST, CULT, REPTSTATUS  Other culture-see note  Objective: Vitals: Today's Vitals   10/08/20 1100 10/08/20 1130 10/08/20 1200  10/08/20 1230  BP: (!) 142/71 (!) 174/74 (!) 160/76 (!) 167/67  Pulse:    (!) 108  Resp:      Temp:    99 F (37.2 C)  TempSrc:    Oral  SpO2:    96%  Weight:    53.5 kg  Height:      PainSc:    2     Intake/Output Summary (Last 24 hours) at 10/08/2020 1308 Last data filed at 10/08/2020 1230 Gross per 24 hour  Intake 240 ml  Output 2500 ml  Net -2260 ml   Filed Weights   10/08/20 0353 10/08/20 0857 10/08/20 1230  Weight: 63.6 kg 55.5 kg 53.5 kg   Weight change: 5.1 kg  Intake/Output from previous day: No intake/output data recorded. Intake/Output this shift: Total I/O In: 240 [P.O.:240] Out: 2500 [Urine:500; Other:2000] Filed Weights   10/08/20 0353 10/08/20 0857 10/08/20 1230  Weight: 63.6 kg 55.5 kg 53.5 kg    Examination: General exam: AAOx 3, older than stated age, weak appearing. HEENT:Oral mucosa moist, Ear/Nose WNL grossly, dentition normal. Respiratory system: bilaterally clear without crackles or wheezes, no use of accessory muscle Cardiovascular system: S1 & S2 +, No JVD,. Gastrointestinal system: Abdomen soft, NT,ND, BS+ Nervous System:Alert, awake, moving extremities and grossly nonfocal Extremities: no edema, distal peripheral pulses palpable.  Skin: No rashes,no icterus. MSK: Normal muscle bulk,tone, power.  Right lower extremity appears somewhat tight.  Data Reviewed: I have personally reviewed following labs and imaging studies CBC: Recent Labs  Lab 10/05/20 0239 10/05/20 0626 10/05/20 2231 10/06/20 0246 10/06/20 1954 10/07/20 0831 10/07/20 1634 10/08/20 0106  WBC 8.2 8.8  --  10.2  --  9.8  --  10.1  HGB 6.8* 7.0*   < > 9.1* 9.3* 9.1* 8.8* 8.6*  HCT 21.2* 21.9*   < > 27.7* 28.0* 27.1* 27.2* 27.3*  MCV 92.2 90.9  --  90.8  --  90.3  --  94.5  PLT 190 181  --  201  --  198  --  195   < > = values in this interval not displayed.   Basic Metabolic Panel: Recent Labs  Lab 10/04/20 0423 10/05/20 0239 10/06/20 0246  10/07/20 0831  10/08/20 0106  NA 132* 129* 128* 131* 128*  K 3.2* 3.3* 3.2* 4.0 3.9  CL 97* 95* 93* 96* 93*  CO2 '27 24 23 26 22  '$ GLUCOSE 110* 216* 165* 196* 242*  BUN <5* '9 17 14 '$ 24*  CREATININE 2.62* 4.03* 5.35* 4.04* 5.50*  CALCIUM 8.8* 8.4* 8.6* 8.3* 8.2*  PHOS 2.3* 3.0 3.5 3.1 3.2   GFR: Estimated Creatinine Clearance: 9 mL/min (A) (by C-G formula based on SCr of 5.5 mg/dL (H)). Liver Function Tests: Recent Labs  Lab 10/04/20 0423 10/05/20 0239 10/06/20 0246 10/07/20 0831 10/08/20 0106  ALBUMIN 2.8* 2.4* 2.8* 2.6* 2.5*   No results for input(s): LIPASE, AMYLASE in the last 168 hours. No results for input(s): AMMONIA in the last 168 hours. Coagulation Profile: No results for input(s): INR, PROTIME in the last 168 hours. Cardiac Enzymes: No results for input(s): CKTOTAL, CKMB, CKMBINDEX, TROPONINI in the last 168 hours. BNP (last 3 results) No results for input(s): PROBNP in the last 8760 hours. HbA1C: No results for input(s): HGBA1C in the last 72 hours. CBG: Recent Labs  Lab 10/07/20 1122 10/07/20 1610 10/07/20 2151 10/08/20 0707 10/08/20 0754  GLUCAP 209* 280* 252* 196* 220*   Lipid Profile: No results for input(s): CHOL, HDL, LDLCALC, TRIG, CHOLHDL, LDLDIRECT in the last 72 hours. Thyroid Function Tests: No results for input(s): TSH, T4TOTAL, FREET4, T3FREE, THYROIDAB in the last 72 hours. Anemia Panel: No results for input(s): VITAMINB12, FOLATE, FERRITIN, TIBC, IRON, RETICCTPCT in the last 72 hours. Sepsis Labs: No results for input(s): PROCALCITON, LATICACIDVEN in the last 168 hours.  No results found for this or any previous visit (from the past 240 hour(s)).   Radiology Studies: No results found.   LOS: 11 days   Antonieta Pert, MD [facility]  10/08/2020, 1:08 PM

## 2020-10-09 ENCOUNTER — Other Ambulatory Visit: Payer: Self-pay | Admitting: *Deleted

## 2020-10-09 DIAGNOSIS — E8779 Other fluid overload: Secondary | ICD-10-CM | POA: Diagnosis not present

## 2020-10-09 DIAGNOSIS — N185 Chronic kidney disease, stage 5: Secondary | ICD-10-CM

## 2020-10-09 LAB — CBC
HCT: 24.6 % — ABNORMAL LOW (ref 36.0–46.0)
Hemoglobin: 7.9 g/dL — ABNORMAL LOW (ref 12.0–15.0)
MCH: 29.7 pg (ref 26.0–34.0)
MCHC: 32.1 g/dL (ref 30.0–36.0)
MCV: 92.5 fL (ref 80.0–100.0)
Platelets: 199 10*3/uL (ref 150–400)
RBC: 2.66 MIL/uL — ABNORMAL LOW (ref 3.87–5.11)
RDW: 17.6 % — ABNORMAL HIGH (ref 11.5–15.5)
WBC: 8.9 10*3/uL (ref 4.0–10.5)
nRBC: 1 % — ABNORMAL HIGH (ref 0.0–0.2)

## 2020-10-09 LAB — GLUCOSE, CAPILLARY
Glucose-Capillary: 204 mg/dL — ABNORMAL HIGH (ref 70–99)
Glucose-Capillary: 196 mg/dL — ABNORMAL HIGH (ref 70–99)
Glucose-Capillary: 325 mg/dL — ABNORMAL HIGH (ref 70–99)
Glucose-Capillary: 482 mg/dL — ABNORMAL HIGH (ref 70–99)

## 2020-10-09 LAB — RENAL FUNCTION PANEL
Albumin: 2.2 g/dL — ABNORMAL LOW (ref 3.5–5.0)
Anion gap: 10 (ref 5–15)
BUN: 13 mg/dL (ref 6–20)
CO2: 24 mmol/L (ref 22–32)
Calcium: 8.1 mg/dL — ABNORMAL LOW (ref 8.9–10.3)
Chloride: 94 mmol/L — ABNORMAL LOW (ref 98–111)
Creatinine, Ser: 3.46 mg/dL — ABNORMAL HIGH (ref 0.44–1.00)
GFR, Estimated: 15 mL/min — ABNORMAL LOW (ref >60)
Glucose, Bld: 204 mg/dL — ABNORMAL HIGH (ref 70–99)
Phosphorus: 3.1 mg/dL (ref 2.5–4.6)
Potassium: 3.4 mmol/L — ABNORMAL LOW (ref 3.5–5.1)
Sodium: 128 mmol/L — ABNORMAL LOW (ref 135–145)

## 2020-10-09 MED ORDER — CHLORHEXIDINE GLUCONATE CLOTH 2 % EX PADS
6.0000 | MEDICATED_PAD | Freq: Every day | CUTANEOUS | Status: DC
  Administered 2020-10-10 – 2020-10-12 (×2): 6 via TOPICAL

## 2020-10-09 NOTE — Progress Notes (Signed)
Occupational Therapy Treatment Patient Details Name: Hannah [name] MRN: [id number] DOB: December 24, 1965 Today's Date: 10/09/2020    History of present illness 55 y.o. female presented with shortness of breath and lower extremity swelling. Admitted on 09/26/20 with Volume overload.  Additionally, pt with ESRD and HD has been initiated this admission.  Pt underwent AV fistula placement 5/19.  Has RLE weakness and swelling in psoas mm on R, limited mobiltiy.  Pt with medical history significant of CKD 5, anemia, CVA, diabetes, hypertension, hyperlipidemia, multiple sclerosis   OT comments  Hannah [name] is progressing well and is extremely grateful to participate in therapy. She completed bed mobility with min A, and a bed>chair stand pivot transfer with rw given min A. Pt benefits from her R knee being blocked with mobility and weight shift due to weakness. Hannah [name] was set up for self feeding and mod A for donning socks. She continues to benefit from acute OT to progress indep in ADLs and mobility. D/c plan remains appropriate.    Follow Up Recommendations  CIR;Other (comment)    Equipment Recommendations  None recommended by OT    Recommendations for Other Services Rehab consult    Precautions / Restrictions Precautions Precautions: Fall Precaution Comments: baseline RLE weak but has edema in psoas Restrictions Weight Bearing Restrictions: No       Mobility Bed Mobility Overal bed mobility: Needs Assistance Bed Mobility: Rolling;Sidelying to Sit Rolling: Min guard Sidelying to sit: Min assist;HOB elevated       General bed mobility comments: min A to elevate trunk fromsidelying    Transfers Overall transfer level: Needs assistance Equipment used: Rolling walker (2 wheeled) Transfers: Sit to/from [facility] Sit to Stand: Min assist;From elevated surface Stand pivot transfers: Min assist;From elevated surface       General transfer comment: Pt completed 4x sit<>  stand, and 1 stan pivot from bed>chair; therapist blocking RLE while weight shifting    Balance Overall balance assessment: Needs assistance Sitting-balance support: No upper extremity supported;Feet supported Sitting balance-Leahy Scale: Fair     Standing balance support: Bilateral upper extremity supported;During functional activity Standing balance-Leahy Scale: Poor           ADL either performed or assessed with clinical judgement   ADL Overall ADL's : Needs assistance/impaired Eating/Feeding: Set up;Sitting Eating/Feeding Details (indicate cue type and reason): pt sitting in chair snacking at end of session                 Lower Body Dressing: Moderate assistance;Sitting/lateral leans Lower Body Dressing Details (indicate cue type and reason): Pt attempted to don sock while sitting EOB, she was able to reach L foot however not flexible enough to thread sock over toes; therapist place sock on toes and pt able to pull up             Functional mobility during ADLs: Minimal assistance;Cueing for safety;Rolling walker General ADL Comments: session focused on functional transfers, pt attempted to don L sock, required mod A to thread over toes and pt assisting to pull up               Cognition Arousal/Alertness: Awake/alert Behavior During Therapy: WFL for tasks assessed/performed Overall Cognitive Status: Within Functional Limits for tasks assessed        General Comments: speech changes from stroke but is on point in conversation              General Comments pt with no new concerns    Pertinent  Vitals/ Pain       Pain Assessment: Faces Faces Pain Scale: Hurts little more Pain Location: R hip Pain Descriptors / Indicators: Grimacing;Guarding Pain Intervention(s): Limited activity within patient's tolerance;Monitored during session;Patient requesting pain meds-RN notified         Frequency  Min 2X/week        Progress Toward Goals  OT  Goals(current goals can now be found in the care plan section)  Progress towards OT goals: Progressing toward goals  Acute Rehab OT Goals Patient Stated Goal: home OT Goal Formulation: With patient Time For Goal Achievement: 10/15/20 Potential to Achieve Goals: Good ADL Goals Pt Will Perform Grooming: with supervision;standing Pt Will Perform Upper Body Bathing: with supervision;sitting Pt Will Perform Lower Body Bathing: with supervision;sit to/from stand Pt Will Perform Lower Body Dressing: with modified independence;sit to/from stand Pt Will Transfer to Toilet: with supervision;ambulating;bedside commode Pt Will Perform Toileting - Clothing Manipulation and hygiene: with supervision;sit to/from stand Additional ADL Goal #1: Pt will be Mod in and OOB for basic ADLs (increaased time) Additional ADL Goal #2: Patient will identify at least 3 energy conservation strategies to employ at home in order to maximize function and quality of life and decrease caregiver burden while preventing exacerbation of symptoms and rehospitalization.  Plan Discharge plan needs to be updated       AM-PAC OT "6 Clicks" Daily Activity     Outcome Measure   Help from another person eating meals?: A Little Help from another person taking care of personal grooming?: A Little Help from another person toileting, which includes using toliet, bedpan, or urinal?: A Little Help from another person bathing (including washing, rinsing, drying)?: A Lot Help from another person to put on and taking off regular upper body clothing?: A Little Help from another person to put on and taking off regular lower body clothing?: A Lot 6 Click Score: 16    End of Session Equipment Utilized During Treatment: Gait belt;Rolling walker  OT Visit Diagnosis: Unsteadiness on feet (R26.81);Other abnormalities of gait and mobility (R26.89);Pain;Muscle weakness (generalized) (M62.81);Hemiplegia and hemiparesis Hemiplegia - Right/Left:  Right Hemiplegia - dominant/non-dominant: Dominant Hemiplegia - caused by: Cerebral infarction Pain - Right/Left: Right Pain - part of body: Hip;Leg   Activity Tolerance Patient tolerated treatment well   Patient Left in bed;with call bell/phone within reach;with bed alarm set   Nurse Communication Mobility status        Time: 1526-1550 OT Time Calculation (min): 24 min  Charges: OT General Charges $OT Visit: 1 Visit OT Treatments $Self Care/Home Management : 23-37 mins    Micheal Murad A Adit Riddles 10/09/2020, 4:45 PM

## 2020-10-09 NOTE — Progress Notes (Signed)
Bedside shift report complete. Received patient awake,alert.orientedx4 and able to verbalize needs. NAD noted; respirations easy on room air. Tele monitor on. Movement/sensation noted to all extremities.SCDs on to b/ lower extremities. RIJ HD catheter noted. Whiteboard updated. All safety measures in place and personal belongings within reach.

## 2020-10-09 NOTE — Progress Notes (Signed)
Nephrology Progress Note:    Subjective:    Had 0.5 kg UOP charted over 5/25 and one unmeasured void.  They are looking at getting some type of therapy - SNF or CIR. last HD on 5/25 with 2 kg UF.  She states PT coming today - she has been doing exercises in bed.   Review of systems   Denies shortness of breath or chest pain Denies n/v Has not been ambulating 2/2 pain from right leg injury  Urinating less  Objective Vital signs in last 24 hours: Vitals:   10/08/20 1427 10/08/20 1946 10/09/20 0554 10/09/20 0800  BP: (!) 149/64 (!) 114/59 (!) 142/70 (!) 151/67  Pulse: (!) 103 98 92 97  Resp: '18 18 18 17  '$ Temp: 98.7 F (37.1 C) (!) 97.5 F (36.4 C) 98.5 F (36.9 C) 98.4 F (36.9 C)  TempSrc: Oral Oral Oral Oral  SpO2: 98% 96% 97% 95%  Weight:      Height:       Weight change: -8.1 kg  Intake/Output Summary (Last 24 hours) at 10/09/2020 1102 Last data filed at 10/08/2020 1230 Gross per 24 hour  Intake --  Output 2000 ml  Net -2000 ml    Assessment/Plan: [name] with DM and HTN with progressive CKD and uremic symptoms   1.ESRD - CKD progressed to ESRD.  S/p AVF 5/19; appreciate help from [facility].   - Continue dialysis on MWF schedule while inpatient  - HD SW is getting an updated outpatient dialysis unit (per charting planned for TCU initially but had a decline in functional status after an injury and now looking at SNF vs CIR)  2. Hypertension/volume  - improved with HD and on current regimen   3. Anemia CKD and iron deficiency.  s/p repletion w/ 1g of iron and aranesp was just increased to 200 mcg weekly on mondays   4. Metabolic bone disease-  PTH 84. No treatment needed. Phos acceptable - controlled with HD  5. Metabolic Acidosis- managed with HD  Disposition - per primary team.  At this time she does not have an outpatient dialysis unit.  HD SW is following    Labs: Basic Metabolic Panel: Recent Labs  Lab 10/07/20 0831 10/08/20 0106 10/09/20 0233  NA  131* 128* 128*  K 4.0 3.9 3.4*  CL 96* 93* 94*  CO2 '26 22 24  '$ GLUCOSE 196* 242* 204*  BUN 14 24* 13  CREATININE 4.04* 5.50* 3.46*  CALCIUM 8.3* 8.2* 8.1*  PHOS 3.1 3.2 3.1   Liver Function Tests: Recent Labs  Lab 10/07/20 0831 10/08/20 0106 10/09/20 0233  ALBUMIN 2.6* 2.5* 2.2*   CBC: Recent Labs  Lab 10/05/20 0626 10/05/20 2231 10/06/20 0246 10/06/20 1954 10/07/20 0831 10/07/20 1634 10/08/20 0106 10/09/20 0233  WBC 8.8  --  10.2  --  9.8  --  10.1 8.9  HGB 7.0*   < > 9.1*   < > 9.1* 8.8* 8.6* 7.9*  HCT 21.9*   < > 27.7*   < > 27.1* 27.2* 27.3* 24.6*  MCV 90.9  --  90.8  --  90.3  --  94.5 92.5  PLT 181  --  201  --  198  --  195 199   < > = values in this interval not displayed.   CBG: Recent Labs  Lab 10/08/20 0754 10/08/20 1309 10/08/20 1802 10/08/20 2013 10/09/20 0636  GLUCAP 220* 154* 237* 212* 204*    Studies/Results: No results found.  Medications: Infusions: . sodium chloride Stopped (09/28/20 1354)    Scheduled Medications: . amLODipine  10 mg Oral Daily  . carvedilol  6.25 mg Oral BID WC  . Chlorhexidine Gluconate Cloth  6 each Topical Q0600  . Chlorhexidine Gluconate Cloth  6 each Topical Q0600  . Chlorhexidine Gluconate Cloth  6 each Topical Q0600  . darbepoetin (ARANESP) injection - DIALYSIS  200 mcg Intravenous Q Mon-HD  . diclofenac Sodium  2 g Topical QID  . famotidine  20 mg Oral Daily  . FLUoxetine  20 mg Oral Daily  . gabapentin  100 mg Oral TID  . hydrALAZINE  50 mg Oral Q8H  . insulin aspart  0-6 Units Subcutaneous TID WC  . insulin glargine  12 Units Subcutaneous QHS  . melatonin  3 mg Oral QHS  . metoCLOPramide  5 mg Oral BID  . rosuvastatin  10 mg Oral Daily  . sodium chloride flush  3 mL Intravenous Q12H  . tiZANidine  2 mg Oral TID    have reviewed scheduled and prn medications.  Vitals:   10/09/20 0554 10/09/20 0800  BP: (!) 142/70 (!) 151/67  Pulse: 92 97  Resp: 18 17  Temp: 98.5 F (36.9 C) 98.4 F (36.9  C)  SpO2: 97% 95%    Physical exam: General adult female in bed in no acute distress at rest; smiling today HEENT normocephalic atraumatic sclera anicteric Neck supple trachea midline Lungs clear to auscultation bilaterally normal work of breathing at rest  Heart S1S2 no rub Abdomen soft nontender nondistended Extremities no edema  Psych normal mood and affect Neuro lying in bed on my exam; voice is stronger; provides hx and follows commands Access RIJ tunn catheter; right AVF bruit  Claudia Desanctis, MD 10/09/2020, 11:16 AM

## 2020-10-09 NOTE — Progress Notes (Signed)
Physical Therapy Treatment Patient Details Name: Hannah [name] MRN: [id number] DOB: 1966-04-08 Today's Date: 10/09/2020    History of Present Illness 55 y.o. female presented with shortness of breath and lower extremity swelling. Admitted on 09/26/20 with Volume overload.  Additionally, pt with ESRD and HD has been initiated this admission.  Pt underwent AV fistula placement 5/19.  Has RLE weakness and swelling in psoas mm on R, limited mobiltiy.  Pt with medical history significant of CKD 5, anemia, CVA, diabetes, hypertension, hyperlipidemia, multiple sclerosis    PT Comments    Patient progressing towards physical therapy goals. Patient ambulated 8' with min guard and RW, cues for RW management and upright posture. Performed seated EOB exercises. Recommend CIR at discharge to maximize functional independence. If denied from CIR, recommend HHPT at discharge as patient reports family can provide 24/7 supervision/assistance. Will continue to follow acutely and reassess.    Follow Up Recommendations  CIR;Home health PT     Equipment Recommendations  None recommended by PT    Recommendations for Other Services       Precautions / Restrictions Precautions Precautions: Fall Precaution Comments: baseline RLE weak but has edema in psoas Restrictions Weight Bearing Restrictions: No    Mobility  Bed Mobility Overal bed mobility: Needs Assistance Bed Mobility: Sit to Supine Rolling: Min guard Sidelying to sit: Min assist;HOB elevated   Sit to supine: Min assist   General bed mobility comments: minA for bringing LEs onto bed    Transfers Overall transfer level: Needs assistance Equipment used: Rolling Deshawn Skelley (2 wheeled) Transfers: Sit to/from Stand Sit to Stand: Min assist;From elevated surface Stand pivot transfers: Min assist;From elevated surface       General transfer comment: minA to rise into standing from recliner  Ambulation/Gait Ambulation/Gait assistance: Min  guard Gait Distance (Feet): 8 Feet Assistive device: Rolling Terrah Decoster (2 wheeled) Gait Pattern/deviations: Step-to pattern;Decreased stance time - right;Decreased dorsiflexion - right;Decreased stride length Gait velocity: decreased   General Gait Details: Min guard for safety. Slow steady pace. Cues for RW management and upright posture.   Stairs             Wheelchair Mobility    Modified Rankin (Stroke Patients Only)       Balance Overall balance assessment: Needs assistance Sitting-balance support: No upper extremity supported;Feet supported Sitting balance-Leahy Scale: Fair     Standing balance support: Bilateral upper extremity supported;During functional activity Standing balance-Leahy Scale: Poor Standing balance comment: reliant on UE support                            Cognition Arousal/Alertness: Awake/alert Behavior During Therapy: WFL for tasks assessed/performed Overall Cognitive Status: Within Functional Limits for tasks assessed                                 General Comments: speech changes from stroke but is on point in conversation      Exercises General Exercises - Lower Extremity Long Arc Quad: AROM;Both;10 reps;Seated Hip Flexion/Marching: AROM;Both;10 reps;Seated    General Comments General comments (skin integrity, edema, etc.): pt with no new concerns      Pertinent Vitals/Pain Pain Assessment: Faces Faces Pain Scale: Hurts little more Pain Location: R hip Pain Descriptors / Indicators: Grimacing;Guarding Pain Intervention(s): Monitored during session;Limited activity within patient's tolerance;Repositioned    Home Living  Prior Function            PT Goals (current goals can now be found in the care plan section) Acute Rehab PT Goals Patient Stated Goal: home PT Goal Formulation: With patient Time For Goal Achievement: 10/15/20 Potential to Achieve Goals: Good Progress  towards PT goals: Progressing toward goals    Frequency    Min 3X/week      PT Plan Discharge plan needs to be updated    Co-evaluation              AM-PAC PT "6 Clicks" Mobility   Outcome Measure  Help needed turning from your back to your side while in a flat bed without using bedrails?: A Little Help needed moving from lying on your back to sitting on the side of a flat bed without using bedrails?: A Little Help needed moving to and from a bed to a chair (including a wheelchair)?: A Little Help needed standing up from a chair using your arms (e.g., wheelchair or bedside chair)?: A Little Help needed to walk in hospital room?: A Little Help needed climbing 3-5 steps with a railing? : A Lot 6 Click Score: 17    End of Session Equipment Utilized During Treatment: Gait belt Activity Tolerance: Patient tolerated treatment well Patient left: in bed;with call bell/phone within reach;with bed alarm set Nurse Communication: Mobility status PT Visit Diagnosis: Other abnormalities of gait and mobility (R26.89);Muscle weakness (generalized) (M62.81);Difficulty in walking, not elsewhere classified (R26.2);Adult, failure to thrive (R62.7)     Time: [id number] PT Time Calculation (min) (ACUTE ONLY): 25 min  Charges:  $Gait Training: 8-22 mins $Therapeutic Exercise: 8-22 mins                     Belita Warsame A. Gilford Rile PT, DPT Acute Rehabilitation Services Pager [phone] Office [phone]    Linna Hoff 10/09/2020, 5:24 PM

## 2020-10-09 NOTE — Plan of Care (Signed)

## 2020-10-09 NOTE — Consult Note (Signed)
   [facility] CM Inpatient Consult   10/09/2020  Hannah [name] 07-14-1965 [id number]   Follow up:  [place] [facility] at [facility], Dr. Yaakov Guthrie  Patient was discussed in multidisciplinary case discussion this morning. Follow up on rounds and disposition and patient is sound asleep at this time.   Plan: Continue to follow for disposition.  Patient is being recommended for inpatient comprehensive inpatient rehab.  Natividad Brood, RN BSN [facility] Liaison  [phone] business mobile phone Toll free office [phone]  Fax number: [phone] [name]

## 2020-10-09 NOTE — Progress Notes (Signed)
Inpatient Rehab Admissions Coordinator:   Awaiting determination from [facility] Medicare regarding prior auth request for CIR. Will continue to follow.   Shann Medal, PT, DPT Admissions Coordinator [phone] 10/09/20  9:50 AM

## 2020-10-09 NOTE — Patient Outreach (Signed)
[facility]) Care Management  10/09/2020  Hannah [name] 1966/02/21 [id number]   Multidisciplinary Case Discussion    Referral Date :09/18/20 Referral [facility] Liaison Referral Reason:[facility] Disease Management , Diabetes, CKD Insurance:[employer]    Medical Information : 55  year old female, PMHx: CVA right hemiparesis, aphasia, hypertension , hyperlipidemia, Diabetes , Chronic and progressive CKD, multiple sclerosis  Admissions Patient admitted to [facility] 4/29- 09/17/20 after PCP visit incease in Creatinine, recommended ED visit , ,Dx:  Acute on Chronic kidney disease, nausea , poor intake , right abd pain,  creatinine at up to 6 from baseline of 4.   She was discharged home with plan for PCP and nephrology follow up.  Patient presented to [facility] 5/13 after PCP visit  Right leg swelling shortness of breath , R/o DVT.  acute on chronic kidney failure, transferred to [facility] on 09/27/20 .  Renal consulted , treated with IV lasix for volume overload,  TDC placed , HD began on 5/15, plan for AV fistula placed on 5/19. ns for PCP and Nephrology follow up. Patient with PT/OT evaluations recommends CIR vs SNF at discharge.  Plan Patient will be followed by [facility] Liaison for discharge disposition plans.    Joylene Draft, RN, BSN  [place] Management Coordinator  [phone]- Mobile [phone]- Toll Free Main Office

## 2020-10-09 NOTE — Progress Notes (Signed)
Renal Navigator following along for disposition and discharge date in order to get outpatient HD clinic seat schedule changed.   Alphonzo Cruise Renal Navigator [phone]

## 2020-10-09 NOTE — Care Management Important Message (Signed)
Important Message  Patient Details  Name: Hannah [name] MRN: [id number] Date of Birth: 11-Jan-1966   Medicare Important Message Given:  Yes     Barb Merino Emmy Keng 10/09/2020, 11:56 AM

## 2020-10-09 NOTE — Progress Notes (Signed)
PROGRESS NOTE    Hannah [name]  [id number] DOB: 1965/06/11 DOA: 09/26/2020 PCP: Vernie Shanks, MD   Chief Complaint  Patient presents with  . Leg Swelling  Brief Narrative: 55 year old female with CKD 5, anemia, CVA, hypertension, diabetes, hyperlipidemia, multiple sclerosis presents with shortness of breath, lower extremity swelling, onset 2 to 3 days prior to admission.  Patient was seen by PCP and sent to the ED for evaluation.  Recently admitted 2 weeks ago for AKI on CKD 4-renal ultrasound was unremarkable believed to be progression of her CKD, her blood pressure medication were adjusted and was planning for nephrology outpatient follow-up. She was seen in the ED creatinine 5.3 similar to previous with anemia elevated BNP DVT study was negative chest x-ray showed ill-defined opacity consistent with pneumonia in the right middle and lower lobes and some atelectasis.  Nephrology was consulted with her volume overload likely approaching ESRD, patient was admitted for further management. Seen by nephrology started on hemodialysis-now on Monday Wednesday Friday schedule. Outpatient dialysis has been set up starting 5/23 at TCU. Patient was complaining of right flank and right thigh pain and CT scan showed right psoas hematoma Patient has been weak with a spasm and pain and deconditioned, PT OT working with her. At this time planning for CIR placement.  Subjective: Seen and examined this morning. Appears pleasant this morning.  Reports pain is managed and feels much better after being on tizanidine yesterday Afebrile overnight.  Blood pressure stable.  On room air. Hemoglobin 7.9 g.  Assessment & Plan:  Longstanding CKD that has progressed to ESRD w/ Volume overload Uremia/metabolic acidosis: Now on HD MWF, status post aVF 5/19.  Nephrology following and had dialysis 5/25.  Recent Labs  Lab 10/05/20 0239 10/06/20 0246 10/07/20 0831 10/08/20 0106 10/09/20 0233  BUN '9 17 14 '$ 24*  13  CREATININE 4.03* 5.35* 4.04* [id number]* [id number]*   Metabolic bone disease PTH 84 no treatment needed phosphorus acceptable-on HD.  Essential hypertension,BP at this time is controlled, was on higher side in  HD- cont her multiple medication including amlodipine 10 mg carvedilol 6.25 mg  Hydralazine 50 mg.  Continue.Marland Kitchen  Hypokalemia -adjust w/ dialysis.  Type 2 diabetes mellitus hemoglobin A1c 8.7 most recently.  Sugar was not controlled, Lantus increased to 12 units, continue sliding scale insulin. At home on Tresiba  And also insulin 10 units 3 times daily. Home glipizide discontinued. Recent Labs  Lab 10/08/20 0754 10/08/20 1309 10/08/20 1802 10/08/20 2013 10/09/20 0636  GLUCAP 220* 154* 237* 212* 204*   History of  Recent CVA in jan 2022 w/ Rt sided spastic weakness/Hyperlipidemia:Continue on Crestor.Plavix was resumed after fistula surgery but holding 10/06/20-due to psoas hematoma and anemia. Based on previous documentation she was having dysarthric speech since jan 2022. CT head this admission shows moderate extensive white matter hypodensity bilaterally similar to prior CT and MRI may represent demyelinating disease with history of multiple sclerosis.She will need follow-up with neurology as outpatient as planned before-this was discussed with patient's daughter.  Acute blood loss anemia needing transfusion Anemia of chronic disease/renal disease,managed by ferric gluconate,ESA by nephrology. Hb dropped to 6.8 g 5/22-transfused 1 unit PRBC and has appropriately increased since and overall stable  Recent Labs  Lab 10/06/20 1954 10/07/20 0831 10/07/20 1634 10/08/20 0106 10/09/20 0233  HGB 9.3* 9.1* 8.8* 8.6* 7.9*  HCT 28.0* 27.1* 27.2* 27.3* 24.6*   Right flank pain/Rt groin pain/Rt psoas enlargement with right psoas hematoma: CT pelvis/lumbar spine- showed right psoas enlargement  most compatible with intramuscular hematoma infection possible but at this time patient does not have any  fever spike or leukocytosis. I discussed with vascular surgery-conservative management holding Plavix.  I discussed with Dr. Donne Hazel from general surgery 5/25- advised to continue with pain management antispasmodic hold Plavix for 2 weeks and no surgery needed. Added to scheduled tizanidine, continue Neurontin, pain control with oral and IV opiates continue PT OT   Left abdomen wall hematoma at heparin inj site- off heparin.  Monitor  Multiple sclerosis:??MS for years as per daughter patient was supposed to have a follow-up with Dr. Leonie Man from last discharge back in February but it appears she has not followed up. I made an internal referral again to neurology Dr Leonie Man. CT head this admission shows similar white matter hypodensity bilaterally. Diet Order            Diet renal/carb modified with fluid restriction Diet-HS Snack? Nothing; Fluid restriction: 1200 mL Fluid; Room service appropriate? Yes; Fluid consistency: Thin  Diet effective now               Patient's Body mass index is 21.57 kg/m. DVT prophylaxis:  Code Status:   Code Status: Full Code   Family Communication: plan of care discussed with patient at bedside. Plan of care was extensively discussed with patient's daughters and had updated after CT scan result previously.  Status is: Inpatient. Remains inpatient appropriate because:IV treatments appropriate due to intensity of illness or inability to take PO and Inpatient level of care appropriate due to severity of illness  Dispo: The patient is from: Home lives with the daughter             Anticipated d/c: Awaiting CIR              Patient currently is medically stable   Difficult to place patient No  Unresulted Labs (From admission, onward)          Start     Ordered   10/09/20 0500  CBC  Daily,   R      10/08/20 1314   09/28/20 0500  Renal function panel  Daily,   R      09/27/20 1218   Signed and Held  Hepatitis B surface antigen  (New Patient [facility]  (Hepatitis B))  Once,   R        Signed and Held   Signed and Held  Hepatitis B surface antibody  (New Patient [facility] (Hepatitis B))  Once,   R        Signed and Held   Signed and Held  Hepatitis B core antibody, total  (New Patient [facility] (Hepatitis B))  Once,   R        Signed and Held   Signed and Held  Hepatitis C antibody  (New Patient [facility] (Hepatitis B))  Once,   R        Signed and Held         Medications reviewed:  Scheduled Meds: . amLODipine  10 mg Oral Daily  . carvedilol  6.25 mg Oral BID WC  . Chlorhexidine Gluconate Cloth  6 each Topical Q0600  . Chlorhexidine Gluconate Cloth  6 each Topical Q0600  . Chlorhexidine Gluconate Cloth  6 each Topical Q0600  . darbepoetin (ARANESP) injection - DIALYSIS  200 mcg Intravenous Q Mon-HD  . diclofenac Sodium  2 g Topical QID  . famotidine  20 mg Oral Daily  . FLUoxetine  20 mg Oral Daily  . gabapentin  100 mg Oral TID  . hydrALAZINE  50 mg Oral Q8H  . insulin aspart  0-6 Units Subcutaneous TID WC  . insulin glargine  12 Units Subcutaneous QHS  . melatonin  3 mg Oral QHS  . metoCLOPramide  5 mg Oral BID  . rosuvastatin  10 mg Oral Daily  . sodium chloride flush  3 mL Intravenous Q12H  . tiZANidine  2 mg Oral TID   Continuous Infusions: . sodium chloride Stopped (09/28/20 1354)    Consultants: Right upper extremity AV fistula placement  Procedures: Nephrology Vascular surgery  Antimicrobials: Anti-infectives (From admission, onward)   Start     Dose/Rate Route Frequency Ordered Stop   10/02/20 1049  ceFAZolin (ANCEF) 2-4 GM/100ML-% IVPB       Note to Pharmacy: Claybon Jabs   : cabinet override      10/02/20 1049 10/02/20 2259   09/29/20 1330  ceFAZolin (ANCEF) 2-4 GM/100ML-% IVPB       Note to Pharmacy: Cecile Sheerer   : cabinet override      09/29/20 1330 09/30/20 0144   09/29/20 0000  ceFAZolin (ANCEF) 1 g in sodium chloride 0.9 % 100 mL IVPB        1 g 200 mL/hr over 30 Minutes Intravenous To  Radiology 09/27/20 1457 09/29/20 1419     Culture/Microbiology No results found for: SDES, SPECREQUEST, CULT, REPTSTATUS  Other culture-see note  Objective: Vitals: Today's Vitals   10/09/20 0046 10/09/20 0116 10/09/20 0554 10/09/20 0650  BP:   (!) 142/70   Pulse:   92   Resp:   18   Temp:   98.5 F (36.9 C)   TempSrc:   Oral   SpO2:   97%   Weight:      Height:      PainSc: 10-Worst pain ever 8   10-Worst pain ever    Intake/Output Summary (Last 24 hours) at 10/09/2020 0753 Last data filed at 10/08/2020 1230 Gross per 24 hour  Intake --  Output 2500 ml  Net -2500 ml   Filed Weights   10/08/20 0353 10/08/20 0857 10/08/20 1230  Weight: 63.6 kg 55.5 kg 53.5 kg   Weight change: -8.1 kg  Intake/Output from previous day: 05/25 0701 - 05/26 0700 In: 240 [P.O.:240] Out: 2500 [Urine:500] Intake/Output this shift: No intake/output data recorded. Filed Weights   10/08/20 0353 10/08/20 0857 10/08/20 1230  Weight: 63.6 kg 55.5 kg 53.5 kg    Examination: General exam: AAOx 3,older than stated age, pleasant not in distress. HEENT:Oral mucosa moist, Ear/Nose WNL grossly, dentition normal. Respiratory system: bilaterally diminished, no wheezing or crackles, no use of accessory muscle Cardiovascular system: S1 & S2 +, No JVD,. Gastrointestinal system: Abdomen soft, obese, NT,ND, BS+ Nervous System:Alert, awake, moving extremities but right lower extremity limited by pain, Extremities: no edema, distal peripheral pulses palpable.  Skin: No rashes,no icterus. MSK: Normal muscle bulk,tone, power  Data Reviewed: I have personally reviewed following labs and imaging studies CBC: Recent Labs  Lab 10/05/20 0626 10/05/20 2231 10/06/20 0246 10/06/20 1954 10/07/20 0831 10/07/20 1634 10/08/20 0106 10/09/20 0233  WBC 8.8  --  10.2  --  9.8  --  10.1 8.9  HGB 7.0*   < > 9.1* 9.3* 9.1* 8.8* 8.6* 7.9*  HCT 21.9*   < > 27.7* 28.0* 27.1* 27.2* 27.3* 24.6*  MCV 90.9  --  90.8  --   90.3  --  94.5 92.5  PLT 181  --  201  --  198  --  195 199   < > = values in this interval not displayed.   Basic Metabolic Panel: Recent Labs  Lab 10/05/20 0239 10/06/20 0246 10/07/20 0831 10/08/20 0106 10/09/20 0233  NA 129* 128* 131* 128* 128*  K 3.3* 3.2* 4.0 3.9 3.4*  CL 95* 93* 96* 93* 94*  CO2 '24 23 26 22 24  '$ GLUCOSE 216* 165* 196* 242* 204*  BUN '9 17 14 '$ 24* 13  CREATININE 4.03* 5.35* 4.04* 5.50* 3.46*  CALCIUM 8.4* 8.6* 8.3* 8.2* 8.1*  PHOS 3.0 3.5 3.1 3.2 3.1   GFR: Estimated Creatinine Clearance: 14.4 mL/min (A) (by C-G formula based on SCr of 3.46 mg/dL (H)). Liver Function Tests: Recent Labs  Lab 10/05/20 0239 10/06/20 0246 10/07/20 0831 10/08/20 0106 10/09/20 0233  ALBUMIN 2.4* 2.8* 2.6* 2.5* 2.2*   No results for input(s): LIPASE, AMYLASE in the last 168 hours. No results for input(s): AMMONIA in the last 168 hours. Coagulation Profile: No results for input(s): INR, PROTIME in the last 168 hours. Cardiac Enzymes: No results for input(s): CKTOTAL, CKMB, CKMBINDEX, TROPONINI in the last 168 hours. BNP (last 3 results) No results for input(s): PROBNP in the last 8760 hours. HbA1C: No results for input(s): HGBA1C in the last 72 hours. CBG: Recent Labs  Lab 10/08/20 0754 10/08/20 1309 10/08/20 1802 10/08/20 2013 10/09/20 0636  GLUCAP 220* 154* 237* 212* 204*   Lipid Profile: No results for input(s): CHOL, HDL, LDLCALC, TRIG, CHOLHDL, LDLDIRECT in the last 72 hours. Thyroid Function Tests: No results for input(s): TSH, T4TOTAL, FREET4, T3FREE, THYROIDAB in the last 72 hours. Anemia Panel: No results for input(s): VITAMINB12, FOLATE, FERRITIN, TIBC, IRON, RETICCTPCT in the last 72 hours. Sepsis Labs: No results for input(s): PROCALCITON, LATICACIDVEN in the last 168 hours.  No results found for this or any previous visit (from the past 240 hour(s)).   Radiology Studies: No results found.   LOS: 12 days   Antonieta Pert, MD [facility]  10/09/2020, 7:53 AM

## 2020-10-10 DIAGNOSIS — E8779 Other fluid overload: Secondary | ICD-10-CM | POA: Diagnosis not present

## 2020-10-10 LAB — CBC
HCT: 24.6 % — ABNORMAL LOW (ref 36.0–46.0)
Hemoglobin: 8 g/dL — ABNORMAL LOW (ref 12.0–15.0)
MCH: 29.6 pg (ref 26.0–34.0)
MCHC: 32.5 g/dL (ref 30.0–36.0)
MCV: 91.1 fL (ref 80.0–100.0)
Platelets: 218 10*3/uL (ref 150–400)
RBC: 2.7 MIL/uL — ABNORMAL LOW (ref 3.87–5.11)
RDW: 17 % — ABNORMAL HIGH (ref 11.5–15.5)
WBC: 7.7 10*3/uL (ref 4.0–10.5)
nRBC: 1 % — ABNORMAL HIGH (ref 0.0–0.2)

## 2020-10-10 LAB — GLUCOSE, CAPILLARY
Glucose-Capillary: 152 mg/dL — ABNORMAL HIGH (ref 70–99)
Glucose-Capillary: 275 mg/dL — ABNORMAL HIGH (ref 70–99)
Glucose-Capillary: 294 mg/dL — ABNORMAL HIGH (ref 70–99)
Glucose-Capillary: 300 mg/dL — ABNORMAL HIGH (ref 70–99)
Glucose-Capillary: 325 mg/dL — ABNORMAL HIGH (ref 70–99)
Glucose-Capillary: 395 mg/dL — ABNORMAL HIGH (ref 70–99)

## 2020-10-10 LAB — RENAL FUNCTION PANEL
Albumin: 2.1 g/dL — ABNORMAL LOW (ref 3.5–5.0)
Anion gap: 9 (ref 5–15)
BUN: 26 mg/dL — ABNORMAL HIGH (ref 6–20)
CO2: 24 mmol/L (ref 22–32)
Calcium: 8.1 mg/dL — ABNORMAL LOW (ref 8.9–10.3)
Chloride: 92 mmol/L — ABNORMAL LOW (ref 98–111)
Creatinine, Ser: 5.32 mg/dL — ABNORMAL HIGH (ref 0.44–1.00)
GFR, Estimated: 9 mL/min — ABNORMAL LOW (ref >60)
Glucose, Bld: 415 mg/dL — ABNORMAL HIGH (ref 70–99)
Phosphorus: 3.3 mg/dL (ref 2.5–4.6)
Potassium: 3.5 mmol/L (ref 3.5–5.1)
Sodium: 125 mmol/L — ABNORMAL LOW (ref 135–145)

## 2020-10-10 MED ORDER — INSULIN GLARGINE 100 UNIT/ML ~~LOC~~ SOLN
15.0000 [IU] | Freq: Every day | SUBCUTANEOUS | Status: DC
  Administered 2020-10-10 – 2020-10-17 (×7): 15 [IU] via SUBCUTANEOUS
  Filled 2020-10-10 (×9): qty 0.15

## 2020-10-10 MED ORDER — HEPARIN SODIUM (PORCINE) 1000 UNIT/ML IJ SOLN
INTRAMUSCULAR | Status: AC
  Filled 2020-10-10: qty 4

## 2020-10-10 MED ORDER — INSULIN ASPART 100 UNIT/ML IJ SOLN
0.0000 [IU] | Freq: Every day | INTRAMUSCULAR | Status: DC
  Administered 2020-10-12: 4 [IU] via SUBCUTANEOUS
  Administered 2020-10-15: 3 [IU] via SUBCUTANEOUS

## 2020-10-10 MED ORDER — INSULIN ASPART 100 UNIT/ML IJ SOLN
0.0000 [IU] | Freq: Three times a day (TID) | INTRAMUSCULAR | Status: DC
  Administered 2020-10-10: 5 [IU] via SUBCUTANEOUS
  Administered 2020-10-11: 7 [IU] via SUBCUTANEOUS
  Administered 2020-10-11 (×2): 3 [IU] via SUBCUTANEOUS
  Administered 2020-10-12 (×2): 2 [IU] via SUBCUTANEOUS
  Administered 2020-10-12: 3 [IU] via SUBCUTANEOUS
  Administered 2020-10-13 – 2020-10-14 (×5): 2 [IU] via SUBCUTANEOUS
  Administered 2020-10-15: 1 [IU] via SUBCUTANEOUS
  Administered 2020-10-16: 5 [IU] via SUBCUTANEOUS
  Administered 2020-10-16: 1 [IU] via SUBCUTANEOUS
  Administered 2020-10-17: 5 [IU] via SUBCUTANEOUS
  Administered 2020-10-17 (×2): 2 [IU] via SUBCUTANEOUS

## 2020-10-10 NOTE — Progress Notes (Signed)
Inpatient Rehab Admissions Coordinator:   Awaiting determination from [facility] Medicare regarding prior auth request for CIR. Will continue to follow.   Shann Medal, PT, DPT Admissions Coordinator [phone] 10/10/20  2:54 PM

## 2020-10-10 NOTE — Progress Notes (Signed)
Results for ZINACHIDI, SLUSARZ (MRN [id number]) as of 10/10/2020 09:17  Ref. Range 10/09/2020 21:30 10/10/2020 00:04 10/10/2020 03:48 10/10/2020 06:06 10/10/2020 07:36  Glucose-Capillary Latest Ref Range: 70 - 99 mg/dL 482 (H) 395 (H) 325 (H) 294 (H) 275 (H)  Noted that blood sugars have been greater than 250 mg/dl.  Recommend increasing Lantus to 15 units at HS and changing Novolog correction scale to SENSITIVE (0-9 units) TID and adding HS scale (0-5 units) if blood sugars continue to be elevated.   Harvel Ricks RN BSN CDE Diabetes Coordinator Pager: [phone]  8am-5pm

## 2020-10-10 NOTE — Progress Notes (Signed)
PROGRESS NOTE Hannah [name]  [id number] DOB: May 15, 1966 DOA: 09/26/2020 PCP: Vernie Shanks, MD   Chief Complaint  Patient presents with  . Leg Swelling  Brief Narrative: 55 year old female with CKD 5, anemia, CVA, hypertension, diabetes, hyperlipidemia, multiple sclerosis presents with shortness of breath, lower extremity swelling, onset 2 to 3 days prior to admission.  Patient was seen by PCP and sent to the ED for evaluation.  Recently admitted 2 weeks ago for AKI on CKD 4-renal ultrasound was unremarkable believed to be progression of her CKD, her blood pressure medication were adjusted and was planning for nephrology outpatient follow-up. She was seen in the ED creatinine 5.3 similar to previous with anemia elevated BNP DVT study was negative chest x-ray showed ill-defined opacity consistent with pneumonia in the right middle and lower lobes and some atelectasis.  Nephrology was consulted with her volume overload likely approaching ESRD, patient was admitted for further management. Seen by nephrology started on hemodialysis-now on Monday Wednesday Friday schedule. Outpatient dialysis has been set up starting 5/23 at TCU. Patient was complaining of right flank and right thigh pain and CT scan showed right psoas hematoma Patient has been weak with a spasm and pain and deconditioned, PT OT working with her. At this time planning for CIR placement.  Subjective: Patient is pleasant and smiling pain is relatively stable spasm on the right lower extremities is better and able to move her right leg.  Assessment & Plan:  Longstanding CKD that has progressed to ESRD w/ Volume overload Uremia/metabolic acidosis: Now on HD MWF, status post aVF 5/19.  Nephrology following and continue dialysis MWF Recent Labs  Lab 10/06/20 0246 10/07/20 0831 10/08/20 0106 10/09/20 0233 10/10/20 0038  BUN 17 14 24* 13 26*  CREATININE 5.35* 4.04* 5.50* [id number]* [id number]*   Metabolic bone disease PTH 84 no  treatment needed phosphorus acceptable-on HD.  Essential hypertension,BP fairly controlled, continue her home multiple medication including amlodipine 10 mg carvedilol 6.25 mg  Hydralazine 50 mg.  Continue.Marland Kitchen  Hypokalemia -improved.  Type 2 diabetes mellitus hemoglobin A1c 8.7 most recently.  Blood sugar poorly controlled increase Lantus to 15 units, changed to sensitive sliding scale.At home on Tresiba  And also insulin 10 units 3 times daily. Home glipizide discontinued. Recent Labs  Lab 10/09/20 2130 10/10/20 0004 10/10/20 0348 10/10/20 0606 10/10/20 0736  GLUCAP 482* 395* 325* 294* 275*   History of  Recent CVA in jan 2022 w/ Rt sided spastic weakness/Hyperlipidemia:Continue on Crestor.Plavix was resumed after fistula surgery but holding 10/06/20-due to psoas hematoma and anemia. Based on previous documentation she was having dysarthric speech since jan 2022. CT head this admission shows moderate extensive white matter hypodensity bilaterally similar to prior CT and MRI may represent demyelinating disease with history of multiple sclerosis.She will need follow-up with neurology as outpatient as planned before-this was discussed with patient's daughter.  Acute blood loss anemia needing transfusion 1 unit prbc Anemia of chronic disease/renal disease,managed by ferric gluconate,ESA by nephrology. Hb dropped to 6.8 g 5/22-transfused 1 unit PRBC and has appropriately increased and overall stable since then.  Recent Labs  Lab 10/07/20 0831 10/07/20 1634 10/08/20 0106 10/09/20 0233 10/10/20 0038  HGB 9.1* 8.8* 8.6* 7.9* 8.0*  HCT 27.1* 27.2* 27.3* 24.6* 24.6*   Right flank pain/Rt groin pain/Rt psoas enlargement with right psoas hematoma: CT pelvis/lumbar spine- showed right psoas enlargement most compatible with intramuscular hematoma, infection possible but at this time patient does not have any fever spike or leukocytosis. I had discussed  with vascular surgery Dr Carlis Abbott- advised  conservative management- also  discussed with Dr. Donne Hazel from general surgery 5/25- advised to continue with pain management antispasmodic hold Plavix for 2 weeks and no surgery needed. Added to scheduled tizanidine-with his patient seems to be responding well in terms of spasm, continue on oral/IV opiates and PT OT.  Left abdomen wall hematoma at heparin inj site- off heparin.  Monitor  Multiple sclerosis:??MS for years as per daughter patient was supposed to have a follow-up with Dr. Leonie Man from last discharge back in February but it appears she has not followed up. I made an internal referral again to neurology Dr Leonie Man. CT head this admission shows similar white matter hypodensity bilaterally. Diet Order            Diet renal/carb modified with fluid restriction Diet-HS Snack? Nothing; Fluid restriction: 1200 mL Fluid; Room service appropriate? Yes; Fluid consistency: Thin  Diet effective now               Patient's Body mass index is 21.57 kg/m. DVT prophylaxis:  Code Status:   Code Status: Full Code   Family Communication: plan of care discussed with patient at bedside. Plan of care was extensively discussed with patient's daughters previously.  Status is: Inpatient. Remains inpatient appropriate because:IV treatments appropriate due to intensity of illness or inability to take PO and Inpatient level of care appropriate due to severity of illness  Dispo: The patient is from: Home lives with the daughter             Anticipated d/c: Awaiting CIR placement.              Patient currently is medically stable   Difficult to place patient No  Unresulted Labs (From admission, onward)          Start     Ordered   10/09/20 0500  CBC  Daily,   R      10/08/20 1314   09/28/20 0500  Renal function panel  Daily,   R      09/27/20 1218   Signed and Held  Hepatitis B surface antigen  (New Patient [facility]o [facility] (Hepatitis B))  Once,   R        Signed and Held   Signed and Held  Hepatitis  B surface antibody  (New Patient [facility] (Hepatitis B))  Once,   R        Signed and Held   Signed and Held  Hepatitis B core antibody, total  (New Patient [facility] (Hepatitis B))  Once,   R        Signed and Held   Signed and Held  Hepatitis C antibody  (New Patient [facility]o [facility] (Hepatitis B))  Once,   R        Signed and Held         Medications reviewed:  Scheduled Meds: . amLODipine  10 mg Oral Daily  . carvedilol  6.25 mg Oral BID WC  . Chlorhexidine Gluconate Cloth  6 each Topical Q0600  . Chlorhexidine Gluconate Cloth  6 each Topical Q0600  . Chlorhexidine Gluconate Cloth  6 each Topical Q0600  . Chlorhexidine Gluconate Cloth  6 each Topical Q0600  . darbepoetin (ARANESP) injection - DIALYSIS  200 mcg Intravenous Q Mon-HD  . diclofenac Sodium  2 g Topical QID  . famotidine  20 mg Oral Daily  . FLUoxetine  20 mg Oral Daily  . gabapentin  100 mg Oral  TID  . hydrALAZINE  50 mg Oral Q8H  . insulin aspart  0-5 Units Subcutaneous QHS  . insulin aspart  0-9 Units Subcutaneous TID WC  . insulin glargine  15 Units Subcutaneous QHS  . melatonin  3 mg Oral QHS  . metoCLOPramide  5 mg Oral BID  . rosuvastatin  10 mg Oral Daily  . sodium chloride flush  3 mL Intravenous Q12H  . tiZANidine  2 mg Oral TID   Continuous Infusions: . sodium chloride Stopped (09/28/20 1354)    Consultants: Right upper extremity AV fistula placement  Procedures: Nephrology Vascular surgery  Antimicrobials: Anti-infectives (From admission, onward)   Start     Dose/Rate Route Frequency Ordered Stop   10/02/20 1049  ceFAZolin (ANCEF) 2-4 GM/100ML-% IVPB       Note to Pharmacy: Claybon Jabs   : cabinet override      10/02/20 1049 10/02/20 2259   09/29/20 1330  ceFAZolin (ANCEF) 2-4 GM/100ML-% IVPB       Note to Pharmacy: Cecile Sheerer   : cabinet override      09/29/20 1330 09/30/20 0144   09/29/20 0000  ceFAZolin (ANCEF) 1 g in sodium chloride 0.9 % 100 mL IVPB        1 g 200 mL/hr over 30  Minutes Intravenous To Radiology 09/27/20 1457 09/29/20 1419     Culture/Microbiology No results found for: SDES, SPECREQUEST, CULT, REPTSTATUS  Other culture-see note  Objective: Vitals: Today's Vitals   10/09/20 2254 10/10/20 0604 10/10/20 0625 10/10/20 0819  BP:  (!) 147/66  (!) 164/68  Pulse:  90  89  Resp:  20  18  Temp:  97.7 F (36.5 C)  98.9 F (37.2 C)  TempSrc:  Oral  Oral  SpO2:  93%  94%  Weight:      Height:      PainSc: Asleep  8  7     Intake/Output Summary (Last 24 hours) at 10/10/2020 1114 Last data filed at 10/10/2020 0900 Gross per 24 hour  Intake 240 ml  Output --  Net 240 ml   Filed Weights   10/08/20 0353 10/08/20 0857 10/08/20 1230  Weight: 63.6 kg 55.5 kg 53.5 kg   Weight change:   Intake/Output from previous day: No intake/output data recorded. Intake/Output this shift: Total I/O In: 240 [P.O.:240] Out: -  Filed Weights   10/08/20 0353 10/08/20 0857 10/08/20 1230  Weight: 63.6 kg 55.5 kg 53.5 kg    Examination: General exam: AAOx 3 older than stated age, weak appearing. HEENT:Oral mucosa moist, Ear/Nose WNL grossly, dentition normal. Respiratory system: bilaterally diminished, no use of accessory muscle Cardiovascular system: S1 & S2 +, No JVD,. Gastrointestinal system: Abdomen soft, NT,ND, BS+ Nervous System:Alert, awake, moving extremities and grossly nonfocal Extremities: no edema, distal peripheral pulses palpable.  Skin: No rashes,no icterus. MSK: Normal muscle bulk,tone, power, pain and spasm on the right lower extremity at the groin site  Data Reviewed: I have personally reviewed following labs and imaging studies CBC: Recent Labs  Lab 10/06/20 0246 10/06/20 1954 10/07/20 0831 10/07/20 1634 10/08/20 0106 10/09/20 0233 10/10/20 0038  WBC 10.2  --  9.8  --  10.1 8.9 7.7  HGB 9.1*   < > 9.1* 8.8* 8.6* 7.9* 8.0*  HCT 27.7*   < > 27.1* 27.2* 27.3* 24.6* 24.6*  MCV 90.8  --  90.3  --  94.5 92.5 91.1  PLT 201  --  198  --   195 199 218   < > =  values in this interval not displayed.   Basic Metabolic Panel: Recent Labs  Lab 10/06/20 0246 10/07/20 0831 10/08/20 0106 10/09/20 0233 10/10/20 0038  NA 128* 131* 128* 128* 125*  K 3.2* 4.0 3.9 3.4* 3.5  CL 93* 96* 93* 94* 92*  CO2 '23 26 22 24 24  '$ GLUCOSE 165* 196* 242* 204* 415*  BUN 17 14 24* 13 26*  CREATININE 5.35* 4.04* 5.50* 3.46* 5.32*  CALCIUM 8.6* 8.3* 8.2* 8.1* 8.1*  PHOS 3.5 3.1 3.2 3.1 3.3   GFR: Estimated Creatinine Clearance: 9.3 mL/min (A) (by C-G formula based on SCr of 5.32 mg/dL (H)). Liver Function Tests: Recent Labs  Lab 10/06/20 0246 10/07/20 0831 10/08/20 0106 10/09/20 0233 10/10/20 0038  ALBUMIN 2.8* 2.6* 2.5* 2.2* 2.1*   No results for input(s): LIPASE, AMYLASE in the last 168 hours. No results for input(s): AMMONIA in the last 168 hours. Coagulation Profile: No results for input(s): INR, PROTIME in the last 168 hours. Cardiac Enzymes: No results for input(s): CKTOTAL, CKMB, CKMBINDEX, TROPONINI in the last 168 hours. BNP (last 3 results) No results for input(s): PROBNP in the last 8760 hours. HbA1C: No results for input(s): HGBA1C in the last 72 hours. CBG: Recent Labs  Lab 10/09/20 2130 10/10/20 0004 10/10/20 0348 10/10/20 0606 10/10/20 0736  GLUCAP 482* 395* 325* 294* 275*   Lipid Profile: No results for input(s): CHOL, HDL, LDLCALC, TRIG, CHOLHDL, LDLDIRECT in the last 72 hours. Thyroid Function Tests: No results for input(s): TSH, T4TOTAL, FREET4, T3FREE, THYROIDAB in the last 72 hours. Anemia Panel: No results for input(s): VITAMINB12, FOLATE, FERRITIN, TIBC, IRON, RETICCTPCT in the last 72 hours. Sepsis Labs: No results for input(s): PROCALCITON, LATICACIDVEN in the last 168 hours.  No results found for this or any previous visit (from the past 240 hour(s)).   Radiology Studies: No results found.   LOS: 13 days   Antonieta Pert, MD [facility]  10/10/2020, 11:14 AM

## 2020-10-10 NOTE — Progress Notes (Signed)
Nephrology Progress Note:    Subjective:   last HD on 5/25 with 2 kg UF.  No urine output charted for 5/26. She asked for renal diet education and we went over some basics.  I let her know outpatient HD unit would educate as well.  She doesn't know final dispo yet.   Review of systems  Denies shortness of breath or chest pain Denies n/v States worked with PT  Objective Vital signs in last 24 hours: Vitals:   10/09/20 1415 10/09/20 1951 10/10/20 0604 10/10/20 0819  BP: 135/66 (!) 125/56 (!) 147/66 (!) 164/68  Pulse: 96 87 90 89  Resp: '16 16 20 18  '$ Temp: 98 F (36.7 C) (!) 97.3 F (36.3 C) 97.7 F (36.5 C) 98.9 F (37.2 C)  TempSrc: Oral Oral Oral Oral  SpO2: 94% 97% 93% 94%  Weight:      Height:       Weight change:   Intake/Output Summary (Last 24 hours) at 10/10/2020 1204 Last data filed at 10/10/2020 0900 Gross per 24 hour  Intake 240 ml  Output --  Net 240 ml    Assessment/Plan: [name] with DM and HTN with progressive CKD and uremic symptoms   1.ESRD - CKD progressed to ESRD.  S/p AVF 5/19; appreciate help from [facility].   - Continue dialysis on MWF schedule while inpatient  - HD SW is getting an updated outpatient dialysis unit (per charting planned for TCU initially but had a decline in functional status after an injury and now looking at SNF vs CIR)  - I asked the HD unit to have educator come by if possible  2. Hypertension/volume  - continue current regimen   3. Anemia CKD and iron deficiency.  s/p repletion w/ 1g of iron and aranesp was just increased to 200 mcg weekly on mondays   4. Metabolic bone disease-  PTH 84. No treatment needed. Phos acceptable - controlled with HD  5. Metabolic Acidosis- managed with HD  6. Hyponatremia - stopping SSRI; continue HD. Alternate regimen per primary team   Disposition - per primary team.  At this time she does not have an outpatient dialysis unit.  HD SW is following    Labs: Basic Metabolic Panel: Recent  Labs  Lab 10/08/20 0106 10/09/20 0233 10/10/20 0038  NA 128* 128* 125*  K 3.9 3.4* 3.5  CL 93* 94* 92*  CO2 '22 24 24  '$ GLUCOSE 242* 204* 415*  BUN 24* 13 26*  CREATININE 5.50* 3.46* 5.32*  CALCIUM 8.2* 8.1* 8.1*  PHOS 3.2 3.1 3.3   Liver Function Tests: Recent Labs  Lab 10/08/20 0106 10/09/20 0233 10/10/20 0038  ALBUMIN 2.5* 2.2* 2.1*   CBC: Recent Labs  Lab 10/06/20 0246 10/06/20 1954 10/07/20 0831 10/07/20 1634 10/08/20 0106 10/09/20 0233 10/10/20 0038  WBC 10.2  --  9.8  --  10.1 8.9 7.7  HGB 9.1*   < > 9.1*   < > 8.6* 7.9* 8.0*  HCT 27.7*   < > 27.1*   < > 27.3* 24.6* 24.6*  MCV 90.8  --  90.3  --  94.5 92.5 91.1  PLT 201  --  198  --  195 199 218   < > = values in this interval not displayed.   CBG: Recent Labs  Lab 10/10/20 0004 10/10/20 0348 10/10/20 0606 10/10/20 0736 10/10/20 1200  GLUCAP 395* 325* 294* 275* 300*    Studies/Results: No results found.  Medications: Infusions: . sodium  chloride Stopped (09/28/20 1354)    Scheduled Medications: . amLODipine  10 mg Oral Daily  . carvedilol  6.25 mg Oral BID WC  . Chlorhexidine Gluconate Cloth  6 each Topical Q0600  . Chlorhexidine Gluconate Cloth  6 each Topical Q0600  . Chlorhexidine Gluconate Cloth  6 each Topical Q0600  . Chlorhexidine Gluconate Cloth  6 each Topical Q0600  . darbepoetin (ARANESP) injection - DIALYSIS  200 mcg Intravenous Q Mon-HD  . diclofenac Sodium  2 g Topical QID  . famotidine  20 mg Oral Daily  . FLUoxetine  20 mg Oral Daily  . gabapentin  100 mg Oral TID  . hydrALAZINE  50 mg Oral Q8H  . insulin aspart  0-5 Units Subcutaneous QHS  . insulin aspart  0-9 Units Subcutaneous TID WC  . insulin glargine  15 Units Subcutaneous QHS  . melatonin  3 mg Oral QHS  . metoCLOPramide  5 mg Oral BID  . rosuvastatin  10 mg Oral Daily  . sodium chloride flush  3 mL Intravenous Q12H  . tiZANidine  2 mg Oral TID    have reviewed scheduled and prn medications.  Vitals:    10/10/20 0604 10/10/20 0819  BP: (!) 147/66 (!) 164/68  Pulse: 90 89  Resp: 20 18  Temp: 97.7 F (36.5 C) 98.9 F (37.2 C)  SpO2: 93% 94%    Physical exam:  General adult female in bed in no acute distress at rest HEENT normocephalic atraumatic sclera anicteric Neck supple trachea midline Lungs clear to auscultation bilaterally normal work of breathing at rest on room air  Heart S1S2 no rub Abdomen soft nontender nondistended Extremities no edema  Psych normal mood and affect Neuro lying in bed on my exam; voice is stronger; provides hx and follows commands Access RIJ tunn catheter; right AVF bruit  Claudia Desanctis, MD 10/10/2020, 12:15 PM

## 2020-10-10 NOTE — Plan of Care (Signed)

## 2020-10-10 NOTE — Progress Notes (Signed)
Physical Therapy Treatment Patient Details Name: Hannah [name] MRN: [id number] DOB: [birth year] Today's Date: 10/10/2020    History of Present Illness 55 y.o. female presented with shortness of breath and lower extremity swelling. Admitted on 09/26/20 with Volume overload.  Additionally, pt with ESRD and HD has been initiated this admission.  Pt underwent AV fistula placement 5/19.  Has RLE weakness and swelling in psoas mm on R, limited mobiltiy.  Pt with medical history significant of CKD 5, anemia, CVA, diabetes, hypertension, hyperlipidemia, multiple sclerosis    PT Comments    Pt very pleasant and eager to participate with therapy. She progressed OOB and increased ambulation distance from previous session. Noted BIL knee flexion during gait. When cued to straighten knees pt complied and immediately lost balance. Ambulated remaining distance with flexed knees. No buckling noted.     Follow Up Recommendations  CIR;Home health PT     Equipment Recommendations  None recommended by PT    Recommendations for Other Services       Precautions / Restrictions Precautions Precautions: Fall Precaution Comments: baseline RLE weak but has edema in psoas Restrictions Weight Bearing Restrictions: No    Mobility  Bed Mobility Overal bed mobility: Needs Assistance Bed Mobility: Sit to Supine     Supine to sit: Min assist     General bed mobility comments: min A to elevate trunk.    Transfers Overall transfer level: Needs assistance Equipment used: Rolling walker (2 wheeled) Transfers: Sit to/from Stand Sit to Stand: Min assist         General transfer comment: minA to rise into standing from bed and recliner. Good caryover learning for hand placement.  Ambulation/Gait Ambulation/Gait assistance: Min guard Gait Distance (Feet): 15 Feet Assistive device: Rolling walker (2 wheeled) Gait Pattern/deviations: Step-to pattern;Decreased stance time - right;Decreased dorsiflexion -  right;Decreased stride length (Knees flexed) Gait velocity: decreased Gait velocity interpretation: <1.31 ft/sec, indicative of household ambulator General Gait Details: Close min guard for safety. Knees in flexion. Pt able to correct with cues but immediately lost balance and returned to flexed stance.   Stairs             Wheelchair Mobility    Modified Rankin (Stroke Patients Only) Modified Rankin (Stroke Patients Only) Pre-Morbid Rankin Score: Moderate disability Modified Rankin: Moderately severe disability     Balance Overall balance assessment: Needs assistance Sitting-balance support: No upper extremity supported;Feet supported Sitting balance-Leahy Scale: Fair Sitting balance - Comments: demonstrates posterior lean, requires tactile cue to correct   Standing balance support: Bilateral upper extremity supported;During functional activity Standing balance-Leahy Scale: Poor Standing balance comment: reliant on UE support                            Cognition Arousal/Alertness: Awake/alert Behavior During Therapy: WFL for tasks assessed/performed Overall Cognitive Status: Within Functional Limits for tasks assessed                                 General Comments: speech changes from stroke but is on point in conversation      Exercises Other Exercises Other Exercises: Sit<>stand 6x, 4x regular, 2x with LLE placed forward to encourage weight shift to the right.    General Comments General comments (skin integrity, edema, etc.): able to don L socks, but assist required for R      Pertinent Vitals/Pain Pain Assessment: Faces Faces  Pain Scale: No hurt    Home Living                      Prior Function            PT Goals (current goals can now be found in the care plan section) Acute Rehab PT Goals Patient Stated Goal: home PT Goal Formulation: With patient Time For Goal Achievement: 10/15/20 Potential to Achieve  Goals: Good Progress towards PT goals: Progressing toward goals    Frequency    Min 3X/week      PT Plan Current plan remains appropriate    Co-evaluation              AM-PAC PT "6 Clicks" Mobility   Outcome Measure  Help needed turning from your back to your side while in a flat bed without using bedrails?: A Little Help needed moving from lying on your back to sitting on the side of a flat bed without using bedrails?: A Little Help needed moving to and from a bed to a chair (including a wheelchair)?: A Little Help needed standing up from a chair using your arms (e.g., wheelchair or bedside chair)?: A Little Help needed to walk in hospital room?: A Little Help needed climbing 3-5 steps with a railing? : A Lot 6 Click Score: 17    End of Session Equipment Utilized During Treatment: Gait belt Activity Tolerance: Patient tolerated treatment well Patient left: with call bell/phone within reach;in chair Nurse Communication: Mobility status PT Visit Diagnosis: Other abnormalities of gait and mobility (R26.89);Muscle weakness (generalized) (M62.81);Difficulty in walking, not elsewhere classified (R26.2);Adult, failure to thrive (R62.7)     Time: [id number] PT Time Calculation (min) (ACUTE ONLY): 19 min  Charges:  $Neuromuscular Re-education: 8-22 mins                     Benjiman Core, [place] Pager [phone] Acute Rehab   Allena Katz 10/10/2020, 11:22 AM

## 2020-10-10 NOTE — Plan of Care (Signed)
  Problem: Education: Goal: Knowledge of General Education information will improve Description Including pain rating scale, medication(s)/side effects and non-pharmacologic comfort measures Outcome: Progressing   

## 2020-10-11 DIAGNOSIS — Z992 Dependence on renal dialysis: Secondary | ICD-10-CM

## 2020-10-11 DIAGNOSIS — I10 Essential (primary) hypertension: Secondary | ICD-10-CM | POA: Diagnosis not present

## 2020-10-11 DIAGNOSIS — E8779 Other fluid overload: Secondary | ICD-10-CM | POA: Diagnosis not present

## 2020-10-11 DIAGNOSIS — N186 End stage renal disease: Secondary | ICD-10-CM | POA: Diagnosis not present

## 2020-10-11 DIAGNOSIS — Z8673 Personal history of transient ischemic attack (TIA), and cerebral infarction without residual deficits: Secondary | ICD-10-CM | POA: Diagnosis not present

## 2020-10-11 LAB — CBC
HCT: 25.8 % — ABNORMAL LOW (ref 36.0–46.0)
Hemoglobin: 8.2 g/dL — ABNORMAL LOW (ref 12.0–15.0)
MCH: 29.2 pg (ref 26.0–34.0)
MCHC: 31.8 g/dL (ref 30.0–36.0)
MCV: 91.8 fL (ref 80.0–100.0)
Platelets: 265 10*3/uL (ref 150–400)
RBC: 2.81 MIL/uL — ABNORMAL LOW (ref 3.87–5.11)
RDW: 17.6 % — ABNORMAL HIGH (ref 11.5–15.5)
WBC: 6.9 10*3/uL (ref 4.0–10.5)
nRBC: 1.3 % — ABNORMAL HIGH (ref 0.0–0.2)

## 2020-10-11 LAB — RENAL FUNCTION PANEL
Albumin: 2.3 g/dL — ABNORMAL LOW (ref 3.5–5.0)
Anion gap: 7 (ref 5–15)
BUN: 8 mg/dL (ref 6–20)
CO2: 28 mmol/L (ref 22–32)
Calcium: 8.1 mg/dL — ABNORMAL LOW (ref 8.9–10.3)
Chloride: 95 mmol/L — ABNORMAL LOW (ref 98–111)
Creatinine, Ser: 2.85 mg/dL — ABNORMAL HIGH (ref 0.44–1.00)
GFR, Estimated: 19 mL/min — ABNORMAL LOW (ref >60)
Glucose, Bld: 196 mg/dL — ABNORMAL HIGH (ref 70–99)
Phosphorus: 2.4 mg/dL — ABNORMAL LOW (ref 2.5–4.6)
Potassium: 3.3 mmol/L — ABNORMAL LOW (ref 3.5–5.1)
Sodium: 130 mmol/L — ABNORMAL LOW (ref 135–145)

## 2020-10-11 LAB — GLUCOSE, CAPILLARY
Glucose-Capillary: 183 mg/dL — ABNORMAL HIGH (ref 70–99)
Glucose-Capillary: 131 mg/dL — ABNORMAL HIGH (ref 70–99)
Glucose-Capillary: 203 mg/dL — ABNORMAL HIGH (ref 70–99)
Glucose-Capillary: 339 mg/dL — ABNORMAL HIGH (ref 70–99)
Glucose-Capillary: 214 mg/dL — ABNORMAL HIGH (ref 70–99)
Glucose-Capillary: 93 mg/dL (ref 70–99)
Glucose-Capillary: 158 mg/dL — ABNORMAL HIGH (ref 70–99)

## 2020-10-11 NOTE — Progress Notes (Signed)
Nephrology Progress Note:    Subjective:   Per charting still waiting on CIR decision.  last HD on 5/27 with 2 kg UF.  One unmeasured urine void over 5/27.  Had right hip pain last night and states her nurse was great.  She's more comfortable now after repositioning and PRN.   Review of systems    Denies shortness of breath or chest pain Denies n/v   Objective Vital signs in last 24 hours: Vitals:   10/10/20 2140 10/11/20 0055 10/11/20 0444 10/11/20 0740  BP: (!) 180/70 (!) 144/63 130/66 (!) 123/57  Pulse: (!) 107 96 93 82  Resp:  '18 17 17  '$ Temp: 98.8 F (37.1 C) 98.9 F (37.2 C) 98.5 F (36.9 C) 98.4 F (36.9 C)  TempSrc: Oral Oral Oral Oral  SpO2: 99% 97% 98% 92%  Weight:      Height:       Weight change:   Intake/Output Summary (Last 24 hours) at 10/11/2020 1143 Last data filed at 10/11/2020 0700 Gross per 24 hour  Intake 440 ml  Output 2010 ml  Net -1570 ml    Assessment/Plan: [name] with DM and HTN with progressive CKD and uremic symptoms   1.ESRD - CKD progressed to ESRD.  S/p AVF 5/19; appreciate help from [facility].   - Continue dialysis on MWF schedule while inpatient  - HD SW is getting an updated outpatient dialysis unit and waiting on final dispo.  per charting planned for TCU initially but had a decline in functional status after an injury and now looking at SNF vs CIR   2. Hypertension/volume  - controlled; continue current regimen   3. Anemia CKD and iron deficiency.  s/p repletion w/ 1g of iron and aranesp was just increased to 200 mcg weekly on mondays   4. Metabolic bone disease-  PTH 84. No treatment needed. Phos acceptable - controlled with HD.  If persistently a bit low can relax renal diet  5. Metabolic Acidosis- managed with HD  6. Hyponatremia - improved with HD.  stopped SSRI; continue HD. Alternate regimen to SSRI per primary team if needed.  Patient states was started at time of a prior stroke  7. Deconditioning - per primary team    Disposition - per primary team.  At this time she does not have an outpatient dialysis unit.  HD SW is following    Labs: Basic Metabolic Panel: Recent Labs  Lab 10/09/20 0233 10/10/20 0038 10/11/20 0111  NA 128* 125* 130*  K 3.4* 3.5 3.3*  CL 94* 92* 95*  CO2 '24 24 28  '$ GLUCOSE 204* 415* 196*  BUN 13 26* 8  CREATININE 3.46* 5.32* 2.85*  CALCIUM 8.1* 8.1* 8.1*  PHOS 3.1 3.3 2.4*   Liver Function Tests: Recent Labs  Lab 10/09/20 0233 10/10/20 0038 10/11/20 0111  ALBUMIN 2.2* 2.1* 2.3*   CBC: Recent Labs  Lab 10/07/20 0831 10/07/20 1634 10/08/20 0106 10/09/20 0233 10/10/20 0038 10/11/20 0111  WBC 9.8  --  10.1 8.9 7.7 6.9  HGB 9.1*   < > 8.6* 7.9* 8.0* 8.2*  HCT 27.1*   < > 27.3* 24.6* 24.6* 25.8*  MCV 90.3  --  94.5 92.5 91.1 91.8  PLT 198  --  195 199 218 265   < > = values in this interval not displayed.   CBG: Recent Labs  Lab 10/10/20 1200 10/10/20 1950 10/10/20 2204 10/11/20 0654 10/11/20 1116  GLUCAP 300* 152* 183* 131* 203*  Studies/Results: No results found.  Medications: Infusions: . sodium chloride Stopped (09/28/20 1354)    Scheduled Medications: . amLODipine  10 mg Oral Daily  . carvedilol  6.25 mg Oral BID WC  . Chlorhexidine Gluconate Cloth  6 each Topical Q0600  . Chlorhexidine Gluconate Cloth  6 each Topical Q0600  . Chlorhexidine Gluconate Cloth  6 each Topical Q0600  . Chlorhexidine Gluconate Cloth  6 each Topical Q0600  . darbepoetin (ARANESP) injection - DIALYSIS  200 mcg Intravenous Q Mon-HD  . diclofenac Sodium  2 g Topical QID  . famotidine  20 mg Oral Daily  . gabapentin  100 mg Oral TID  . hydrALAZINE  50 mg Oral Q8H  . insulin aspart  0-5 Units Subcutaneous QHS  . insulin aspart  0-9 Units Subcutaneous TID WC  . insulin glargine  15 Units Subcutaneous QHS  . melatonin  3 mg Oral QHS  . metoCLOPramide  5 mg Oral BID  . rosuvastatin  10 mg Oral Daily  . sodium chloride flush  3 mL Intravenous Q12H  .  tiZANidine  2 mg Oral TID    have reviewed scheduled and prn medications.  Vitals:   10/11/20 0444 10/11/20 0740  BP: 130/66 (!) 123/57  Pulse: 93 82  Resp: 17 17  Temp: 98.5 F (36.9 C) 98.4 F (36.9 C)  SpO2: 98% 92%    Physical exam:  General adult female in bed in no acute distress at rest HEENT normocephalic atraumatic sclera anicteric Neck supple trachea midline Lungs clear to auscultation bilaterally normal work of breathing at rest on room air  Heart S1S2 no rub Abdomen soft nontender nondistended Extremities no edema  Psych normal mood and affect Neuro lying in bed on my exam; provides some hx and follows commands Access RIJ tunn catheter; right AVF bruit and thrill   Claudia Desanctis, MD 10/11/2020, 11:56 AM

## 2020-10-11 NOTE — Progress Notes (Signed)
PROGRESS NOTE Hannah [name]  [id number] DOB: 1965-09-17 DOA: 09/26/2020 PCP: Vernie Shanks, MD   Chief Complaint  Patient presents with  . Leg Swelling  Brief Narrative: 55 year old female with CKD 5, anemia, CVA, hypertension, diabetes, hyperlipidemia, multiple sclerosis presents with shortness of breath, lower extremity swelling, onset 2 to 3 days prior to admission.  Patient was seen by PCP and sent to the ED for evaluation.  Recently admitted 2 weeks ago for AKI on CKD 4-renal ultrasound was unremarkable believed to be progression of her CKD, her blood pressure medication were adjusted and was planning for nephrology outpatient follow-up. She was seen in the ED creatinine 5.3 similar to previous with anemia elevated BNP DVT study was negative chest x-ray showed ill-defined opacity consistent with pneumonia in the right middle and lower lobes and some atelectasis.  Nephrology was consulted with her volume overload likely approaching ESRD, patient was admitted for further management. Seen by nephrology started on hemodialysis-now on Monday Wednesday Friday schedule. Outpatient dialysis has been set up starting 5/23 at TCU. Patient was complaining of right flank and right thigh pain and CT scan showed right psoas hematoma Patient has been weak with a spasm and pain and deconditioned, PT OT working with her. At this time planning for CIR placement.  10/03/2020: Patient seen alongside patient's daughter.  No new changes.  Patient is awaiting CIR placement.  Subjective: For no shortness of breath. -No chest pain. -No fever or chills.  Assessment & Plan:  Longstanding CKD that has progressed to ESRD w/ Volume overload Uremia/metabolic acidosis: Now on HD MWF, status post aVF 5/19.  Nephrology following and continue dialysis MWF 10/11/2020: Nephrology is directing care.  Continue renal replacement therapy on Monday, Wednesday and Friday. Recent Labs  Lab 10/07/20 0831 10/08/20 0106  10/09/20 0233 10/10/20 0038 10/11/20 0111  BUN 14 24* 13 26* 8  CREATININE 4.04* 5.50* 3.46* [id number]* [id number]*   Metabolic bone disease PTH 84 no treatment needed phosphorus acceptable-on HD.  Essential hypertension,BP fairly controlled, continue her home multiple medication including amlodipine 10 mg carvedilol 6.25 mg  Hydralazine 50 mg.  Continue. 10/11/2020: Continue to monitor closely.  Hypokalemia -improved. 10/11/2020: Potassium today is 3.3.  Type 2 diabetes mellitus hemoglobin A1c 8.7 most recently.  Blood sugar poorly controlled increase Lantus to 15 units, changed to sensitive sliding scale.At home on Tresiba  And also insulin 10 units 3 times daily. Home glipizide discontinued. Recent Labs  Lab 10/10/20 2204 10/11/20 0654 10/11/20 1116 10/11/20 1424 10/11/20 1555  GLUCAP 183* 131* 203* 339* 214*   History of  Recent CVA in jan 2022 w/ Rt sided spastic weakness/Hyperlipidemia:Continue on Crestor.Plavix was resumed after fistula surgery but holding 10/06/20-due to psoas hematoma and anemia. Based on previous documentation she was having dysarthric speech since jan 2022. CT head this admission shows moderate extensive white matter hypodensity bilaterally similar to prior CT and MRI may represent demyelinating disease with history of multiple sclerosis.She will need follow-up with neurology as outpatient as planned before-this was discussed with patient's daughter.  Acute blood loss anemia needing transfusion 1 unit prbc Anemia of chronic disease/renal disease,managed by ferric gluconate,ESA by nephrology. Hb dropped to 6.8 g 5/22-transfused 1 unit PRBC and has appropriately increased and overall stable since then.  Recent Labs  Lab 10/07/20 1634 10/08/20 0106 10/09/20 0233 10/10/20 0038 10/11/20 0111  HGB 8.8* 8.6* 7.9* 8.0* 8.2*  HCT 27.2* 27.3* 24.6* 24.6* 25.8*   Right flank pain/Rt groin pain/Rt psoas enlargement with right psoas hematoma:  CT pelvis/lumbar spine- showed  right psoas enlargement most compatible with intramuscular hematoma, infection possible but at this time patient does not have any fever spike or leukocytosis. I had discussed with vascular surgery Dr Carlis Abbott- advised conservative management- also  discussed with Dr. Donne Hazel from general surgery 5/25- advised to continue with pain management antispasmodic hold Plavix for 2 weeks and no surgery needed. Added to scheduled tizanidine-with his patient seems to be responding well in terms of spasm, continue on oral/IV opiates and PT OT.  Left abdomen wall hematoma at heparin inj site- off heparin.  Monitor  Multiple sclerosis:??MS for years as per daughter patient was supposed to have a follow-up with Dr. Leonie Man from last discharge back in February but it appears she has not followed up. I made an internal referral again to neurology Dr Leonie Man. CT head this admission shows similar white matter hypodensity bilaterally. Diet Order            Diet renal/carb modified with fluid restriction Diet-HS Snack? Nothing; Fluid restriction: 1200 mL Fluid; Room service appropriate? Yes; Fluid consistency: Thin  Diet effective now               Patient's Body mass index is 23.99 kg/m. DVT prophylaxis:  Code Status:   Code Status: Full Code   Family Communication: plan of care discussed with patient at bedside. Plan of care was extensively discussed with patient's daughters previously.  Status is: Inpatient. Remains inpatient appropriate because:IV treatments appropriate due to intensity of illness or inability to take PO and Inpatient level of care appropriate due to severity of illness  Dispo: The patient is from: Home lives with the daughter             Anticipated d/c: Awaiting CIR placement.              Patient currently is medically stable   Difficult to place patient No  Unresulted Labs (From admission, onward)          Start     Ordered   09/28/20 0500  Renal function panel  Daily,   R       09/27/20 1218   Signed and Held  Hepatitis B surface antigen  (New Patient [facility] (Hepatitis B))  Once,   R        Signed and Held   Signed and Held  Hepatitis B surface antibody  (New Patient [facility] (Hepatitis B))  Once,   R        Signed and Held   Signed and Held  Hepatitis B core antibody, total  (New Patient [facility] (Hepatitis B))  Once,   R        Signed and Held   Signed and Held  Hepatitis C antibody  (New Patient [facility] (Hepatitis B))  Once,   R        Signed and Held         Medications reviewed:  Scheduled Meds: . amLODipine  10 mg Oral Daily  . carvedilol  6.25 mg Oral BID WC  . Chlorhexidine Gluconate Cloth  6 each Topical Q0600  . Chlorhexidine Gluconate Cloth  6 each Topical Q0600  . Chlorhexidine Gluconate Cloth  6 each Topical Q0600  . Chlorhexidine Gluconate Cloth  6 each Topical Q0600  . darbepoetin (ARANESP) injection - DIALYSIS  200 mcg Intravenous Q Mon-HD  . diclofenac Sodium  2 g Topical QID  . famotidine  20 mg Oral Daily  . gabapentin  100 mg Oral TID  . hydrALAZINE  50 mg Oral Q8H  . insulin aspart  0-5 Units Subcutaneous QHS  . insulin aspart  0-9 Units Subcutaneous TID WC  . insulin glargine  15 Units Subcutaneous QHS  . melatonin  3 mg Oral QHS  . metoCLOPramide  5 mg Oral BID  . rosuvastatin  10 mg Oral Daily  . sodium chloride flush  3 mL Intravenous Q12H  . tiZANidine  2 mg Oral TID   Continuous Infusions: . sodium chloride Stopped (09/28/20 1354)    Consultants: Right upper extremity AV fistula placement  Procedures: Nephrology Vascular surgery  Antimicrobials: Anti-infectives (From admission, onward)   Start     Dose/Rate Route Frequency Ordered Stop   10/02/20 1049  ceFAZolin (ANCEF) 2-4 GM/100ML-% IVPB       Note to Pharmacy: Claybon Jabs   : cabinet override      10/02/20 1049 10/02/20 2259   09/29/20 1330  ceFAZolin (ANCEF) 2-4 GM/100ML-% IVPB       Note to Pharmacy: Cecile Sheerer   : cabinet override       09/29/20 1330 09/30/20 0144   09/29/20 0000  ceFAZolin (ANCEF) 1 g in sodium chloride 0.9 % 100 mL IVPB        1 g 200 mL/hr over 30 Minutes Intravenous To Radiology 09/27/20 1457 09/29/20 1419     Culture/Microbiology No results found for: SDES, SPECREQUEST, CULT, REPTSTATUS  Other culture-see note  Objective: Vitals: Today's Vitals   10/11/20 0951 10/11/20 1404 10/11/20 1427 10/11/20 1552  BP:  111/62 (!) 152/70   Pulse:  78 84   Resp:  17 16   Temp:  98.1 F (36.7 C) 98.8 F (37.1 C)   TempSrc:  Oral Oral   SpO2:  98% 100%   Weight:      Height:      PainSc: Asleep  0-No pain 3     Intake/Output Summary (Last 24 hours) at 10/11/2020 1652 Last data filed at 10/11/2020 0700 Gross per 24 hour  Intake 440 ml  Output 2010 ml  Net -1570 ml   Filed Weights   10/08/20 0857 10/08/20 1230 10/10/20 1510  Weight: 55.5 kg 53.5 kg 59.5 kg   Weight change:   Intake/Output from previous day: 05/27 0701 - 05/28 0700 In: 680 [P.O.:680] Out: 2010  Intake/Output this shift: No intake/output data recorded. Filed Weights   10/08/20 0857 10/08/20 1230 10/10/20 1510  Weight: 55.5 kg 53.5 kg 59.5 kg    Examination: General exam: Patient is awake and alert.  Patient is not in any distress.  Patient has right chest PermCath. HEENT: Mild pallor.  No jaundice.   Respiratory system: Clear to auscultation. Cardiovascular system: S1 & S2 mild systolic murmur.  Gastrointestinal system: Abdomen soft, NT,ND, BS+ Nervous System:Alert, awake.  Patient moves all extremities.   Extremities: No leg edema.  Data Reviewed: I have personally reviewed following labs and imaging studies CBC: Recent Labs  Lab 10/07/20 0831 10/07/20 1634 10/08/20 0106 10/09/20 0233 10/10/20 0038 10/11/20 0111  WBC 9.8  --  10.1 8.9 7.7 6.9  HGB 9.1* 8.8* 8.6* 7.9* 8.0* 8.2*  HCT 27.1* 27.2* 27.3* 24.6* 24.6* 25.8*  MCV 90.3  --  94.5 92.5 91.1 91.8  PLT 198  --  195 199 218 [id number]   Basic Metabolic  Panel: Recent Labs  Lab 10/07/20 0831 10/08/20 0106 10/09/20 0233 10/10/20 0038 10/11/20 0111  NA 131* 128* 128* 125* 130*  K 4.0 3.9  3.4* 3.5 3.3*  CL 96* 93* 94* 92* 95*  CO2 '26 22 24 24 28  '$ GLUCOSE 196* 242* 204* 415* 196*  BUN 14 24* 13 26* 8  CREATININE 4.04* 5.50* 3.46* 5.32* 2.85*  CALCIUM 8.3* 8.2* 8.1* 8.1* 8.1*  PHOS 3.1 3.2 3.1 3.3 2.4*   GFR: Estimated Creatinine Clearance: 17.4 mL/min (A) (by C-G formula based on SCr of 2.85 mg/dL (H)). Liver Function Tests: Recent Labs  Lab 10/07/20 0831 10/08/20 0106 10/09/20 0233 10/10/20 0038 10/11/20 0111  ALBUMIN 2.6* 2.5* 2.2* 2.1* 2.3*   No results for input(s): LIPASE, AMYLASE in the last 168 hours. No results for input(s): AMMONIA in the last 168 hours. Coagulation Profile: No results for input(s): INR, PROTIME in the last 168 hours. Cardiac Enzymes: No results for input(s): CKTOTAL, CKMB, CKMBINDEX, TROPONINI in the last 168 hours. BNP (last 3 results) No results for input(s): PROBNP in the last 8760 hours. HbA1C: No results for input(s): HGBA1C in the last 72 hours. CBG: Recent Labs  Lab 10/10/20 2204 10/11/20 0654 10/11/20 1116 10/11/20 1424 10/11/20 1555  GLUCAP 183* 131* 203* 339* 214*   Lipid Profile: No results for input(s): CHOL, HDL, LDLCALC, TRIG, CHOLHDL, LDLDIRECT in the last 72 hours. Thyroid Function Tests: No results for input(s): TSH, T4TOTAL, FREET4, T3FREE, THYROIDAB in the last 72 hours. Anemia Panel: No results for input(s): VITAMINB12, FOLATE, FERRITIN, TIBC, IRON, RETICCTPCT in the last 72 hours. Sepsis Labs: No results for input(s): PROCALCITON, LATICACIDVEN in the last 168 hours.  No results found for this or any previous visit (from the past 240 hour(s)).   Radiology Studies: No results found.   LOS: 14 days   Bonnell Public, MD [facility]  10/11/2020, 4:52 PM

## 2020-10-11 NOTE — Progress Notes (Signed)
Occupational Therapy Treatment Patient Details Name: Hannah [name] MRN: [id number] DOB: [birth year] Today's Date: 10/11/2020    History of present illness 55 y.o. female presented with shortness of breath and lower extremity swelling. Admitted on 09/26/20 with Volume overload.  Additionally, pt with ESRD and HD has been initiated this admission.  Pt underwent AV fistula placement 5/19.  Has RLE weakness and swelling in psoas mm on R, limited mobiltiy.  Pt with medical history significant of CKD 5, anemia, CVA, diabetes, hypertension, hyperlipidemia, multiple sclerosis   OT comments  Pt making incremental progress towards OT goals. Pt is very motivated to move, however she stated that she feels very groggy today possibly due to pain meds she received over night. Pt required mod assist to transfer to the chair this session, due to increased weakness. Pt was able to stand with min assist, however had difficulty remaining standing and taking a few steps to pivot to recliner. RN made aware. Acute OT will continue to follow pt to address functional mobility and ADL performance.    Follow Up Recommendations  CIR    Equipment Recommendations  None recommended by OT    Recommendations for Other Services      Precautions / Restrictions Precautions Precautions: Fall Precaution Comments: baseline RLE weak but has edema in psoas Restrictions Weight Bearing Restrictions: No       Mobility Bed Mobility Overal bed mobility: Modified Independent       Supine to sit: Min assist     General bed mobility comments: min A to elevate trunk.    Transfers Overall transfer level: Needs assistance Equipment used: Rolling walker (2 wheeled);1 person hand held assist Transfers: Sit to/from Stand Sit to Stand: Min assist Stand pivot transfers: Min assist;From elevated surface       General transfer comment: minA to rise into standing from bed and recliner. requiring assist with pivot to recliner     Balance Overall balance assessment: Needs assistance Sitting-balance support: No upper extremity supported;Feet supported Sitting balance-Leahy Scale: Poor Sitting balance - Comments: demonstrates posterior lean, requires tactile cue to correct Postural control: Posterior lean Standing balance support: Bilateral upper extremity supported;During functional activity Standing balance-Leahy Scale: Poor Standing balance comment: reliant on UE support                           ADL either performed or assessed with clinical judgement   ADL Overall ADL's : Needs assistance/impaired Eating/Feeding: Set up;Sitting Eating/Feeding Details (indicate cue type and reason): pt sitting in chair snacking at end of session Grooming: Set up;Sitting;Wash/dry face;Wash/dry Nurse, mental health Details (indicate cue type and reason): completed in Customer service manager Transfer: Moderate assistance;Stand-pivot;BSC Toilet Transfer Details (indicate cue type and reason): simulated transferring to recliner. Pt more weak than previous notes, she reported she had pain meds during the night and she thought it was from that.         Functional mobility during ADLs: Moderate assistance;Cueing for safety;Rolling walker General ADL Comments: Attempted 2 sit to stand transfers to recliner with RW, pt stood x2, took 1 step then lost her balance. OT then stood in front of pt and had her use her shoulders to push up on and hold on to while they transferred.     Vision       Quarry manager  Arousal/Alertness: Awake/alert Behavior During Therapy: WFL for tasks assessed/performed Overall Cognitive Status: Within Functional Limits for tasks assessed                                 General Comments: speech changes from stroke but is on point in conversation        Exercises General Exercises - Lower Extremity Long Arc Quad: AROM;Both;10 reps;Seated    Shoulder Instructions       General Comments Pt reports she feels really groggy, stating that she things it from pain meds last night    Pertinent Vitals/ Pain       Pain Assessment: No/denies pain  Home Living                                          Prior Functioning/Environment              Frequency  Min 2X/week        Progress Toward Goals  OT Goals(current goals can now be found in the care plan section)  Progress towards OT goals: Progressing toward goals  Acute Rehab OT Goals Patient Stated Goal: home OT Goal Formulation: With patient Time For Goal Achievement: 10/15/20 Potential to Achieve Goals: Good ADL Goals Pt Will Perform Grooming: with supervision;standing Pt Will Perform Upper Body Bathing: with supervision;sitting Pt Will Perform Lower Body Bathing: with supervision;sit to/from stand Pt Will Perform Lower Body Dressing: with modified independence;sit to/from stand Pt Will Transfer to Toilet: with supervision;ambulating;bedside commode Pt Will Perform Toileting - Clothing Manipulation and hygiene: with supervision;sit to/from stand Additional ADL Goal #1: Pt will be Mod in and OOB for basic ADLs (increaased time) Additional ADL Goal #2: Patient will identify at least 3 energy conservation strategies to employ at home in order to maximize function and quality of life and decrease caregiver burden while preventing exacerbation of symptoms and rehospitalization.  Plan Discharge plan remains appropriate;Frequency remains appropriate    Co-evaluation                 AM-PAC OT "6 Clicks" Daily Activity     Outcome Measure   Help from another person eating meals?: A Little Help from another person taking care of personal grooming?: A Little Help from another person toileting, which includes using toliet, bedpan, or urinal?: A Little Help from another person bathing (including washing, rinsing, drying)?: A Lot Help from  another person to put on and taking off regular upper body clothing?: A Little Help from another person to put on and taking off regular lower body clothing?: A Lot 6 Click Score: 16    End of Session Equipment Utilized During Treatment: Gait belt;Rolling walker  OT Visit Diagnosis: Unsteadiness on feet (R26.81);Muscle weakness (generalized) (M62.81);Hemiplegia and hemiparesis;Other abnormalities of gait and mobility (R26.89) Hemiplegia - Right/Left: Right Hemiplegia - dominant/non-dominant: Dominant Hemiplegia - caused by: Cerebral infarction   Activity Tolerance Patient tolerated treatment well   Patient Left in chair;with call bell/phone within reach;with chair alarm set;with family/visitor present   Nurse Communication Mobility status        Time: [id number] OT Time Calculation (min): 25 min  Charges: OT General Charges $OT Visit: 1 Visit OT Treatments $Self Care/Home Management : 23-37 mins  Winston Misner H., OTR/L Acute Rehabilitation  Velera Lansdale Elane Nieve Rojero 10/11/2020, 2:03 PM

## 2020-10-12 DIAGNOSIS — Z992 Dependence on renal dialysis: Secondary | ICD-10-CM

## 2020-10-12 DIAGNOSIS — I10 Essential (primary) hypertension: Secondary | ICD-10-CM | POA: Diagnosis not present

## 2020-10-12 DIAGNOSIS — N186 End stage renal disease: Secondary | ICD-10-CM

## 2020-10-12 LAB — GLUCOSE, CAPILLARY
Glucose-Capillary: 178 mg/dL — ABNORMAL HIGH (ref 70–99)
Glucose-Capillary: 199 mg/dL — ABNORMAL HIGH (ref 70–99)
Glucose-Capillary: 221 mg/dL — ABNORMAL HIGH (ref 70–99)
Glucose-Capillary: 331 mg/dL — ABNORMAL HIGH (ref 70–99)

## 2020-10-12 LAB — RENAL FUNCTION PANEL
Albumin: 2.3 g/dL — ABNORMAL LOW (ref 3.5–5.0)
Anion gap: 7 (ref 5–15)
BUN: 14 mg/dL (ref 6–20)
CO2: 27 mmol/L (ref 22–32)
Calcium: 8.4 mg/dL — ABNORMAL LOW (ref 8.9–10.3)
Chloride: 94 mmol/L — ABNORMAL LOW (ref 98–111)
Creatinine, Ser: 4.76 mg/dL — ABNORMAL HIGH (ref 0.44–1.00)
GFR, Estimated: 10 mL/min — ABNORMAL LOW (ref >60)
Glucose, Bld: 147 mg/dL — ABNORMAL HIGH (ref 70–99)
Phosphorus: 4.2 mg/dL (ref 2.5–4.6)
Potassium: 3.7 mmol/L (ref 3.5–5.1)
Sodium: 128 mmol/L — ABNORMAL LOW (ref 135–145)

## 2020-10-12 MED ORDER — SENNOSIDES-DOCUSATE SODIUM 8.6-50 MG PO TABS
1.0000 | ORAL_TABLET | Freq: Two times a day (BID) | ORAL | Status: DC
  Administered 2020-10-12 – 2020-10-18 (×13): 1 via ORAL
  Filled 2020-10-12 (×13): qty 1

## 2020-10-12 MED ORDER — POLYETHYLENE GLYCOL 3350 17 G PO PACK
17.0000 g | PACK | Freq: Every day | ORAL | Status: DC
  Administered 2020-10-12 – 2020-10-18 (×7): 17 g via ORAL
  Filled 2020-10-12 (×7): qty 1

## 2020-10-12 MED ORDER — CHLORHEXIDINE GLUCONATE CLOTH 2 % EX PADS
6.0000 | MEDICATED_PAD | Freq: Every day | CUTANEOUS | Status: DC
  Administered 2020-10-13 – 2020-10-18 (×6): 6 via TOPICAL

## 2020-10-12 MED ORDER — OXYCODONE-ACETAMINOPHEN 5-325 MG PO TABS
1.0000 | ORAL_TABLET | Freq: Four times a day (QID) | ORAL | Status: DC | PRN
Start: 2020-10-12 — End: 2020-10-18
  Administered 2020-10-12 – 2020-10-17 (×9): 1 via ORAL
  Filled 2020-10-12 (×9): qty 1

## 2020-10-12 NOTE — Progress Notes (Signed)
Nephrology Progress Note:    Subjective:   Per charting still waiting on CIR decision per last charted update on 5/27.  last HD on 5/27 with 2 kg UF.  No charted uop over 5/28.  She has sat in a chair this am.  Uncomfortable after about an hour and a half she states and we discussed for outpatient HD would need to sit for a 3.5 hour tx.   Review of systems    Denies shortness of breath or chest pain Denies n/v Hip pain a little better   Objective Vital signs in last 24 hours: Vitals:   10/11/20 2100 10/11/20 2109 10/12/20 0500 10/12/20 0944  BP: (!) 115/53 138/61 (!) 129/58 (!) 148/58  Pulse: 89 87 91 89  Resp: '17  17 18  '$ Temp: 98 F (36.7 C)  98.4 F (36.9 C) 98.3 F (36.8 C)  TempSrc: Oral  Oral   SpO2: 98%  100% 95%  Weight:      Height:       Weight change:   Intake/Output Summary (Last 24 hours) at 10/12/2020 1100 Last data filed at 10/11/2020 2100 Gross per 24 hour  Intake 240 ml  Output --  Net 240 ml    Assessment/Plan: [name] with DM and HTN with progressive CKD and uremic symptoms   1.ESRD - CKD progressed to ESRD.  S/p AVF 5/19; appreciate help from [facility].   - Continue dialysis on MWF schedule while inpatient  - HD SW is getting an updated outpatient dialysis unit and waiting on final dispo.  per charting planned for TCU initially but had a decline in functional status after an injury and now looking at CIR hopefully  2. Hypertension/volume  - controlled; continue current regimen   3. Anemia CKD and iron deficiency.  s/p repletion of iron and aranesp was just increased to 200 mcg weekly on mondays   4. Metabolic bone disease-  PTH 84. No treatment needed. Phos acceptable - controlled with HD.  If persistently a bit low can relax renal diet  5. Metabolic Acidosis- managed with HD  6. Hyponatremia - improved with HD.  stopped SSRI; continue HD. Alternate regimen to SSRI per primary team if needed.  Patient states it was started at time of a prior  stroke  7. Deconditioning - per primary team   Disposition - per primary team.  At this time she does not have an outpatient dialysis unit.  HD SW is following.  Note that she will need to be able to sit in a chair for 3.5 hours for HD outpatient and currently she cannot tolerate due to pain.  Hopeful for rehab placement.    Labs: Basic Metabolic Panel: Recent Labs  Lab 10/10/20 0038 10/11/20 0111 10/12/20 0419  NA 125* 130* 128*  K 3.5 3.3* 3.7  CL 92* 95* 94*  CO2 '24 28 27  '$ GLUCOSE 415* 196* 147*  BUN 26* 8 14  CREATININE 5.32* 2.85* 4.76*  CALCIUM 8.1* 8.1* 8.4*  PHOS 3.3 2.4* 4.2   Liver Function Tests: Recent Labs  Lab 10/10/20 0038 10/11/20 0111 10/12/20 0419  ALBUMIN 2.1* 2.3* 2.3*   CBC: Recent Labs  Lab 10/07/20 0831 10/07/20 1634 10/08/20 0106 10/09/20 0233 10/10/20 0038 10/11/20 0111  WBC 9.8  --  10.1 8.9 7.7 6.9  HGB 9.1*   < > 8.6* 7.9* 8.0* 8.2*  HCT 27.1*   < > 27.3* 24.6* 24.6* 25.8*  MCV 90.3  --  94.5 92.5 91.1  91.8  PLT 198  --  195 199 218 265   < > = values in this interval not displayed.   CBG: Recent Labs  Lab 10/11/20 1424 10/11/20 1555 10/11/20 2026 10/11/20 2247 10/12/20 0705  GLUCAP 339* 214* 93 158* 178*    Studies/Results: No results found.  Medications: Infusions: . sodium chloride Stopped (09/28/20 1354)    Scheduled Medications: . amLODipine  10 mg Oral Daily  . carvedilol  6.25 mg Oral BID WC  . Chlorhexidine Gluconate Cloth  6 each Topical Q0600  . Chlorhexidine Gluconate Cloth  6 each Topical Q0600  . Chlorhexidine Gluconate Cloth  6 each Topical Q0600  . Chlorhexidine Gluconate Cloth  6 each Topical Q0600  . darbepoetin (ARANESP) injection - DIALYSIS  200 mcg Intravenous Q Mon-HD  . diclofenac Sodium  2 g Topical QID  . famotidine  20 mg Oral Daily  . gabapentin  100 mg Oral TID  . hydrALAZINE  50 mg Oral Q8H  . insulin aspart  0-5 Units Subcutaneous QHS  . insulin aspart  0-9 Units Subcutaneous TID  WC  . insulin glargine  15 Units Subcutaneous QHS  . melatonin  3 mg Oral QHS  . metoCLOPramide  5 mg Oral BID  . polyethylene glycol  17 g Oral Daily  . rosuvastatin  10 mg Oral Daily  . senna-docusate  1 tablet Oral BID  . sodium chloride flush  3 mL Intravenous Q12H  . tiZANidine  2 mg Oral TID    have reviewed scheduled and prn medications.  Vitals:   10/12/20 0500 10/12/20 0944  BP: (!) 129/58 (!) 148/58  Pulse: 91 89  Resp: 17 18  Temp: 98.4 F (36.9 C) 98.3 F (36.8 C)  SpO2: 100% 95%    Physical exam:  General adult female in bed in no acute distress at rest HEENT normocephalic atraumatic sclera anicteric Neck supple trachea midline Lungs clear to auscultation bilaterally unlabored on room air  Heart S1S2 no rub Abdomen soft nontender nondistended Extremities no edema  Psych normal mood and affect Neuro sitting in chair on my exam; awake and alert; provides hx and follows commands Access RIJ tunn catheter; right AVF bruit  Claudia Desanctis, MD 10/12/2020, 11:11 AM

## 2020-10-12 NOTE — Progress Notes (Signed)
PROGRESS NOTE Hannah [name]  [id number] DOB: 11/09/1965 DOA: 09/26/2020 PCP: Vernie Shanks, MD   Chief Complaint  Patient presents with  . Leg Swelling  Brief Narrative: 55 year old female with CKD 5, anemia, CVA, hypertension, diabetes, hyperlipidemia, multiple sclerosis presents with shortness of breath, lower extremity swelling, onset 2 to 3 days prior to admission.  Patient was seen by PCP and sent to the ED for evaluation.  Recently admitted 2 weeks ago for AKI on CKD 4-renal ultrasound was unremarkable believed to be progression of her CKD, her blood pressure medication were adjusted and was planning for nephrology outpatient follow-up. She was seen in the ED creatinine 5.3 similar to previous with anemia elevated BNP DVT study was negative chest x-ray showed ill-defined opacity consistent with pneumonia in the right middle and lower lobes and some atelectasis.  Nephrology was consulted with her volume overload likely approaching ESRD, patient was admitted for further management. Seen by nephrology started on hemodialysis-now on Monday Wednesday Friday schedule. Outpatient dialysis has been set up starting 5/23 at TCU. Patient was complaining of right flank and right thigh pain and CT scan showed right psoas hematoma Patient has been weak with a spasm and pain and deconditioned, PT OT working with her. At this time planning for CIR placement.  10/12/2020: Patient seen.  No new changes.  Patient is awaiting CIR placement.  Subjective: -No shortness of breath. -No chest pain. -No fever or chills.  Assessment & Plan:  Longstanding CKD that has progressed to ESRD w/ Volume overload Uremia/metabolic acidosis: Now on HD MWF, status post aVF 5/19.  Nephrology following and continue dialysis MWF 10/11/2020: Nephrology is directing care.  Continue renal replacement therapy on Monday, Wednesday and Friday. Recent Labs  Lab 10/08/20 0106 10/09/20 0233 10/10/20 0038 10/11/20 0111  10/12/20 0419  BUN 24* 13 26* 8 14  CREATININE 5.50* 3.46* 5.32* [id number]* [id number]*   Metabolic bone disease PTH 84 no treatment needed phosphorus acceptable-on HD.  Essential hypertension,BP fairly controlled, continue her home multiple medication including amlodipine 10 mg carvedilol 6.25 mg  Hydralazine 50 mg.  Continue. 10/11/2020: Continue to monitor closely.  Hypokalemia -improved. 10/11/2020: Potassium today is 3.3. 10/12/2020: Potassium today is 3.7.  Type 2 diabetes mellitus hemoglobin A1c 8.7 most recently.  Blood sugar poorly controlled increase Lantus to 15 units, changed to sensitive sliding scale.At home on Tresiba  And also insulin 10 units 3 times daily. Home glipizide discontinued. Recent Labs  Lab 10/11/20 2026 10/11/20 2247 10/12/20 0705 10/12/20 1133 10/12/20 1643  GLUCAP 93 158* 178* 199* 221*   History of  Recent CVA in jan 2022 w/ Rt sided spastic weakness/Hyperlipidemia:Continue on Crestor.Plavix was resumed after fistula surgery but holding 10/06/20-due to psoas hematoma and anemia. Based on previous documentation she was having dysarthric speech since jan 2022. CT head this admission shows moderate extensive white matter hypodensity bilaterally similar to prior CT and MRI may represent demyelinating disease with history of multiple sclerosis.She will need follow-up with neurology as outpatient as planned before-this was discussed with patient's daughter.  Acute blood loss anemia needing transfusion 1 unit prbc Anemia of chronic disease/renal disease,managed by ferric gluconate,ESA by nephrology. Hb dropped to 6.8 g 5/22-transfused 1 unit PRBC and has appropriately increased and overall stable since then.  Recent Labs  Lab 10/07/20 1634 10/08/20 0106 10/09/20 0233 10/10/20 0038 10/11/20 0111  HGB 8.8* 8.6* 7.9* 8.0* 8.2*  HCT 27.2* 27.3* 24.6* 24.6* 25.8*   Right flank pain/Rt groin pain/Rt psoas enlargement with right psoas  hematoma: CT pelvis/lumbar spine-  showed right psoas enlargement most compatible with intramuscular hematoma, infection possible but at this time patient does not have any fever spike or leukocytosis. I had discussed with vascular surgery Dr Carlis Abbott- advised conservative management- also  discussed with Dr. Donne Hazel from general surgery 5/25- advised to continue with pain management antispasmodic hold Plavix for 2 weeks and no surgery needed. Added to scheduled tizanidine-with his patient seems to be responding well in terms of spasm, continue on oral/IV opiates and PT OT.  Left abdomen wall hematoma at heparin inj site- off heparin.  Monitor  Multiple sclerosis:??MS for years as per daughter patient was supposed to have a follow-up with Dr. Leonie Man from last discharge back in February but it appears she has not followed up. I made an internal referral again to neurology Dr Leonie Man. CT head this admission shows similar white matter hypodensity bilaterally. Diet Order            Diet renal/carb modified with fluid restriction Diet-HS Snack? Nothing; Fluid restriction: 1200 mL Fluid; Room service appropriate? Yes; Fluid consistency: Thin  Diet effective now               Patient's Body mass index is 23.99 kg/m. DVT prophylaxis:  Code Status:   Code Status: Full Code   Family Communication: plan of care discussed with patient at bedside. Plan of care was extensively discussed with patient's daughters previously.  Status is: Inpatient. Remains inpatient appropriate because:IV treatments appropriate due to intensity of illness or inability to take PO and Inpatient level of care appropriate due to severity of illness  Dispo: The patient is from: Home lives with the daughter             Anticipated d/c: Awaiting CIR placement.              Patient currently is medically stable   Difficult to place patient No  Unresulted Labs (From admission, onward)          Start     Ordered   09/28/20 0500  Renal function panel  Daily,   R       09/27/20 1218   Signed and Held  Hepatitis B surface antigen  (New [facility] (Hepatitis B))  Once,   R        Signed and Held   Signed and Held  Hepatitis B surface antibody  (New Patient [facility] (Hepatitis B))  Once,   R        Signed and Held   Signed and Held  Hepatitis B core antibody, total  (New [facility] (Hepatitis B))  Once,   R        Signed and Held   Signed and Held  Hepatitis C antibody  (New Patient [facility] (Hepatitis B))  Once,   R        Signed and Held         Medications reviewed:  Scheduled Meds: . amLODipine  10 mg Oral Daily  . carvedilol  6.25 mg Oral BID WC  . Chlorhexidine Gluconate Cloth  6 each Topical Q0600  . Chlorhexidine Gluconate Cloth  6 each Topical Q0600  . Chlorhexidine Gluconate Cloth  6 each Topical Q0600  . Chlorhexidine Gluconate Cloth  6 each Topical Q0600  . darbepoetin (ARANESP) injection - DIALYSIS  200 mcg Intravenous Q Mon-HD  . diclofenac Sodium  2 g Topical QID  . famotidine  20 mg Oral Daily  .  gabapentin  100 mg Oral TID  . hydrALAZINE  50 mg Oral Q8H  . insulin aspart  0-5 Units Subcutaneous QHS  . insulin aspart  0-9 Units Subcutaneous TID WC  . insulin glargine  15 Units Subcutaneous QHS  . melatonin  3 mg Oral QHS  . metoCLOPramide  5 mg Oral BID  . polyethylene glycol  17 g Oral Daily  . rosuvastatin  10 mg Oral Daily  . senna-docusate  1 tablet Oral BID  . sodium chloride flush  3 mL Intravenous Q12H  . tiZANidine  2 mg Oral TID   Continuous Infusions: . sodium chloride Stopped (09/28/20 1354)    Consultants: Right upper extremity AV fistula placement  Procedures: Nephrology Vascular surgery  Antimicrobials: Anti-infectives (From admission, onward)   Start     Dose/Rate Route Frequency Ordered Stop   10/02/20 1049  ceFAZolin (ANCEF) 2-4 GM/100ML-% IVPB       Note to Pharmacy: Claybon Jabs   : cabinet override      10/02/20 1049 10/02/20 2259   09/29/20 1330  ceFAZolin (ANCEF) 2-4  GM/100ML-% IVPB       Note to Pharmacy: Cecile Sheerer   : cabinet override      09/29/20 1330 09/30/20 0144   09/29/20 0000  ceFAZolin (ANCEF) 1 g in sodium chloride 0.9 % 100 mL IVPB        1 g 200 mL/hr over 30 Minutes Intravenous To Radiology 09/27/20 1457 09/29/20 1419     Culture/Microbiology No results found for: SDES, SPECREQUEST, CULT, REPTSTATUS  Other culture-see note  Objective: Vitals: Today's Vitals   10/12/20 0800 10/12/20 0944 10/12/20 1344 10/12/20 1519  BP:  (!) 148/58 (!) 109/56   Pulse:  89 85   Resp: '20 18 17   '$ Temp:  98.3 F (36.8 C) 98.6 F (37 C)   TempSrc:   Oral   SpO2:  95% 100%   Weight:      Height:      PainSc: 6    6     Intake/Output Summary (Last 24 hours) at 10/12/2020 1737 Last data filed at 10/11/2020 2100 Gross per 24 hour  Intake 240 ml  Output --  Net 240 ml   Filed Weights   10/08/20 0857 10/08/20 1230 10/10/20 1510  Weight: 55.5 kg 53.5 kg 59.5 kg   Weight change:   Intake/Output from previous day: 05/28 0701 - 05/29 0700 In: 240 [P.O.:240] Out: -  Intake/Output this shift: No intake/output data recorded. Filed Weights   10/08/20 0857 10/08/20 1230 10/10/20 1510  Weight: 55.5 kg 53.5 kg 59.5 kg    Examination: General exam: Patient is awake and alert.  Patient is not in any distress.  Patient has right chest PermCath. HEENT: Mild pallor.  No jaundice.   Respiratory system: Clear to auscultation. Cardiovascular system: S1 & S2 mild systolic murmur.  Gastrointestinal system: Abdomen soft, NT,ND, BS+ Nervous System:Alert, awake.  Patient moves all extremities.   Extremities: No leg edema.  Data Reviewed: I have personally reviewed following labs and imaging studies CBC: Recent Labs  Lab 10/07/20 0831 10/07/20 1634 10/08/20 0106 10/09/20 0233 10/10/20 0038 10/11/20 0111  WBC 9.8  --  10.1 8.9 7.7 6.9  HGB 9.1* 8.8* 8.6* 7.9* 8.0* 8.2*  HCT 27.1* 27.2* 27.3* 24.6* 24.6* 25.8*  MCV 90.3  --  94.5 92.5 91.1  91.8  PLT 198  --  195 199 218 [id number]   Basic Metabolic Panel: Recent Labs  Lab 10/08/20 0106  10/09/20 0233 10/10/20 0038 10/11/20 0111 10/12/20 0419  NA 128* 128* 125* 130* 128*  K 3.9 3.4* 3.5 3.3* 3.7  CL 93* 94* 92* 95* 94*  CO2 '22 24 24 28 27  '$ GLUCOSE 242* 204* 415* 196* 147*  BUN 24* 13 26* 8 14  CREATININE 5.50* 3.46* 5.32* 2.85* 4.76*  CALCIUM 8.2* 8.1* 8.1* 8.1* 8.4*  PHOS 3.2 3.1 3.3 2.4* 4.2   GFR: Estimated Creatinine Clearance: 10.4 mL/min (A) (by C-G formula based on SCr of 4.76 mg/dL (H)). Liver Function Tests: Recent Labs  Lab 10/08/20 0106 10/09/20 0233 10/10/20 0038 10/11/20 0111 10/12/20 0419  ALBUMIN 2.5* 2.2* 2.1* 2.3* 2.3*   No results for input(s): LIPASE, AMYLASE in the last 168 hours. No results for input(s): AMMONIA in the last 168 hours. Coagulation Profile: No results for input(s): INR, PROTIME in the last 168 hours. Cardiac Enzymes: No results for input(s): CKTOTAL, CKMB, CKMBINDEX, TROPONINI in the last 168 hours. BNP (last 3 results) No results for input(s): PROBNP in the last 8760 hours. HbA1C: No results for input(s): HGBA1C in the last 72 hours. CBG: Recent Labs  Lab 10/11/20 2026 10/11/20 2247 10/12/20 0705 10/12/20 1133 10/12/20 1643  GLUCAP 93 158* 178* 199* 221*   Lipid Profile: No results for input(s): CHOL, HDL, LDLCALC, TRIG, CHOLHDL, LDLDIRECT in the last 72 hours. Thyroid Function Tests: No results for input(s): TSH, T4TOTAL, FREET4, T3FREE, THYROIDAB in the last 72 hours. Anemia Panel: No results for input(s): VITAMINB12, FOLATE, FERRITIN, TIBC, IRON, RETICCTPCT in the last 72 hours. Sepsis Labs: No results for input(s): PROCALCITON, LATICACIDVEN in the last 168 hours.  No results found for this or any previous visit (from the past 240 hour(s)).   Radiology Studies: No results found.   LOS: 15 days   Bonnell Public, MD [facility]  10/12/2020, 5:37 PM

## 2020-10-13 DIAGNOSIS — Z992 Dependence on renal dialysis: Secondary | ICD-10-CM | POA: Diagnosis not present

## 2020-10-13 DIAGNOSIS — E8779 Other fluid overload: Secondary | ICD-10-CM | POA: Diagnosis not present

## 2020-10-13 DIAGNOSIS — N186 End stage renal disease: Secondary | ICD-10-CM | POA: Diagnosis not present

## 2020-10-13 LAB — RENAL FUNCTION PANEL
Albumin: 2.2 g/dL — ABNORMAL LOW (ref 3.5–5.0)
Anion gap: 10 (ref 5–15)
BUN: 27 mg/dL — ABNORMAL HIGH (ref 6–20)
CO2: 24 mmol/L (ref 22–32)
Calcium: 8.2 mg/dL — ABNORMAL LOW (ref 8.9–10.3)
Chloride: 94 mmol/L — ABNORMAL LOW (ref 98–111)
Creatinine, Ser: 6.54 mg/dL — ABNORMAL HIGH (ref 0.44–1.00)
GFR, Estimated: 7 mL/min — ABNORMAL LOW (ref >60)
Glucose, Bld: 200 mg/dL — ABNORMAL HIGH (ref 70–99)
Phosphorus: 5.2 mg/dL — ABNORMAL HIGH (ref 2.5–4.6)
Potassium: 4 mmol/L (ref 3.5–5.1)
Sodium: 128 mmol/L — ABNORMAL LOW (ref 135–145)

## 2020-10-13 LAB — GLUCOSE, CAPILLARY
Glucose-Capillary: 180 mg/dL — ABNORMAL HIGH (ref 70–99)
Glucose-Capillary: 128 mg/dL — ABNORMAL HIGH (ref 70–99)
Glucose-Capillary: 152 mg/dL — ABNORMAL HIGH (ref 70–99)
Glucose-Capillary: 159 mg/dL — ABNORMAL HIGH (ref 70–99)

## 2020-10-13 MED ORDER — DARBEPOETIN ALFA 200 MCG/0.4ML IJ SOSY
PREFILLED_SYRINGE | INTRAMUSCULAR | Status: AC
  Administered 2020-10-13: 200 ug via INTRAVENOUS
  Filled 2020-10-13: qty 0.4

## 2020-10-13 MED ORDER — GABAPENTIN 100 MG PO CAPS
100.0000 mg | ORAL_CAPSULE | Freq: Every day | ORAL | Status: DC
  Administered 2020-10-14 – 2020-10-17 (×4): 100 mg via ORAL
  Filled 2020-10-13 (×5): qty 1

## 2020-10-13 MED ORDER — HYDROMORPHONE HCL 1 MG/ML IJ SOLN
0.2500 mg | Freq: Four times a day (QID) | INTRAMUSCULAR | Status: DC | PRN
Start: 2020-10-13 — End: 2020-10-18
  Administered 2020-10-13 – 2020-10-14 (×4): 0.25 mg via INTRAVENOUS
  Filled 2020-10-13 (×5): qty 0.5

## 2020-10-13 MED ORDER — OXYCODONE-ACETAMINOPHEN 5-325 MG PO TABS
ORAL_TABLET | ORAL | Status: AC
  Filled 2020-10-13: qty 1

## 2020-10-13 NOTE — Plan of Care (Signed)
  Problem: Education: Goal: Knowledge of General Education information will improve Description: Including pain rating scale, medication(s)/side effects and non-pharmacologic comfort measures Outcome: Not Progressing   Problem: Health Behavior/Discharge Planning: Goal: Ability to manage health-related needs will improve Outcome: Not Progressing   Problem: Clinical Measurements: Goal: Will remain free from infection Outcome: Not Progressing   Problem: Activity: Goal: Risk for activity intolerance will decrease Outcome: Not Progressing   Problem: Nutrition: Goal: Adequate nutrition will be maintained Outcome: Not Progressing

## 2020-10-13 NOTE — Progress Notes (Signed)
PROGRESS NOTE Hannah [name]  [id number] DOB: [birth year] DOA: 09/26/2020 PCP: Vernie Shanks, MD   Chief Complaint  Patient presents with  . Leg Swelling  Brief Narrative: 55 year old female with CKD 5, anemia, CVA, hypertension, diabetes, hyperlipidemia, multiple sclerosis presents with shortness of breath, lower extremity swelling, onset 2 to 3 days prior to admission.  Patient was seen by PCP and sent to the ED for evaluation.  Recently admitted 2 weeks ago for AKI on CKD 4-renal ultrasound was unremarkable believed to be progression of her CKD, her blood pressure medication were adjusted and was planning for nephrology outpatient follow-up. She was seen in the ED creatinine 5.3 similar to previous with anemia elevated BNP DVT study was negative chest x-ray showed ill-defined opacity consistent with pneumonia in the right middle and lower lobes and some atelectasis.  Nephrology was consulted with her volume overload likely approaching ESRD, patient was admitted for further management. Seen by nephrology started on hemodialysis-now on Monday Wednesday Friday schedule. Outpatient dialysis has been set up starting 5/23 at TCU. Patient was complaining of right flank and right thigh pain and CT scan showed right psoas hematoma Patient has been weak with a spasm and pain and deconditioned, PT OT working with her. At this time planning for CIR placement.  10/12/2020: Patient seen.  No new changes.  Patient is awaiting CIR placement. 10/13/2020: Patient seen.  Patient underwent hemodialysis today.  Nephrology input is appreciated.  Patient continues to report pain.  Patient is asking for IV Dilaudid.  Patient reports side effects versus allergy to oral opiates.  Subjective: -No shortness of breath. -No chest pain. -No fever or chills.  Assessment & Plan:  Longstanding CKD that has progressed to ESRD w/ Volume overload Uremia/metabolic acidosis: Now on HD MWF, status post aVF 5/19.  Nephrology  following and continue dialysis MWF 10/11/2020: Nephrology is directing care.  Continue renal replacement therapy on Monday, Wednesday and Friday. 10/13/2020: Patient underwent hemodialysis today.  Input from nephrology team is appreciated. Recent Labs  Lab 10/09/20 0233 10/10/20 0038 10/11/20 0111 10/12/20 0419 10/13/20 0104  BUN 13 26* 8 14 27*  CREATININE 3.46* 5.32* 2.85* [id number]* [id number]*   Metabolic bone disease PTH 84 no treatment needed phosphorus acceptable-on HD.  Essential hypertension,BP fairly controlled, continue her home multiple medication including amlodipine 10 mg carvedilol 6.25 mg  Hydralazine 50 mg.  Continue. 10/11/2020: Continue to monitor closely.  Hypokalemia -improved. 10/11/2020: Potassium today is 3.3. 10/12/2020: Potassium today is 3.7.  Type 2 diabetes mellitus hemoglobin A1c 8.7 most recently.  Blood sugar poorly controlled increase Lantus to 15 units, changed to sensitive sliding scale.At home on Tresiba  And also insulin 10 units 3 times daily. Home glipizide discontinued. Recent Labs  Lab 10/12/20 1643 10/12/20 2100 10/13/20 0611 10/13/20 1210 10/13/20 1459  GLUCAP 221* 331* 180* 128* 152*   History of  Recent CVA in jan 2022 w/ Rt sided spastic weakness/Hyperlipidemia:Continue on Crestor.Plavix was resumed after fistula surgery but holding 10/06/20-due to psoas hematoma and anemia. Based on previous documentation she was having dysarthric speech since jan 2022. CT head this admission shows moderate extensive white matter hypodensity bilaterally similar to prior CT and MRI may represent demyelinating disease with history of multiple sclerosis.She will need follow-up with neurology as outpatient as planned before-this was discussed with patient's daughter.  Acute blood loss anemia needing transfusion 1 unit prbc Anemia of chronic disease/renal disease,managed by ferric gluconate,ESA by nephrology. Hb dropped to 6.8 g 5/22-transfused 1 unit PRBC and has  appropriately increased and overall stable since then.  Recent Labs  Lab 10/07/20 1634 10/08/20 0106 10/09/20 0233 10/10/20 0038 10/11/20 0111  HGB 8.8* 8.6* 7.9* 8.0* 8.2*  HCT 27.2* 27.3* 24.6* 24.6* 25.8*   Right flank pain/Rt groin pain/Rt psoas enlargement with right psoas hematoma: CT pelvis/lumbar spine- showed right psoas enlargement most compatible with intramuscular hematoma, infection possible but at this time patient does not have any fever spike or leukocytosis. I had discussed with vascular surgery Dr Carlis Abbott- advised conservative management- also  discussed with Dr. Donne Hazel from general surgery 5/25- advised to continue with pain management antispasmodic hold Plavix for 2 weeks and no surgery needed. Added to scheduled tizanidine-with his patient seems to be responding well in terms of spasm, continue on oral/IV opiates and PT OT.  Left abdomen wall hematoma at heparin inj site- off heparin.  Monitor  Multiple sclerosis:??MS for years as per daughter patient was supposed to have a follow-up with Dr. Leonie Man from last discharge back in February but it appears she has not followed up. I made an internal referral again to neurology Dr Leonie Man. CT head this admission shows similar white matter hypodensity bilaterally. Diet Order            Diet renal/carb modified with fluid restriction Diet-HS Snack? Nothing; Fluid restriction: 1200 mL Fluid; Room service appropriate? Yes; Fluid consistency: Thin  Diet effective now               Patient's Body mass index is 23.1 kg/m. DVT prophylaxis:  Code Status:   Code Status: Full Code   Family Communication: plan of care discussed with patient at bedside. Plan of care was extensively discussed with patient's daughters previously.  Status is: Inpatient. Remains inpatient appropriate because:IV treatments appropriate due to intensity of illness or inability to take PO and Inpatient level of care appropriate due to severity of  illness  Dispo: The patient is from: Home lives with the daughter             Anticipated d/c: Awaiting CIR placement.              Patient currently is medically stable   Difficult to place patient No  Unresulted Labs (From admission, onward)          Start     Ordered   09/28/20 0500  Renal function panel  Daily,   R      09/27/20 1218   Signed and Held  Hepatitis B surface antigen  (New Patient [facility] (Hepatitis B))  Once,   R        Signed and Held   Signed and Held  Hepatitis B surface antibody  (New Patient [facility] (Hepatitis B))  Once,   R        Signed and Held   Signed and Held  Hepatitis B core antibody, total  (New Patient [facility] (Hepatitis B))  Once,   R        Signed and Held   Signed and Held  Hepatitis C antibody  (New Patient [facility] (Hepatitis B))  Once,   R        Signed and Held         Medications reviewed:  Scheduled Meds: . amLODipine  10 mg Oral Daily  . carvedilol  6.25 mg Oral BID WC  . Chlorhexidine Gluconate Cloth  6 each Topical Q0600  . Chlorhexidine Gluconate Cloth  6 each Topical Q0600  . darbepoetin (ARANESP) injection -  DIALYSIS  200 mcg Intravenous Q Mon-HD  . diclofenac Sodium  2 g Topical QID  . famotidine  20 mg Oral Daily  . [START ON 10/14/2020] gabapentin  100 mg Oral QHS  . hydrALAZINE  50 mg Oral Q8H  . insulin aspart  0-5 Units Subcutaneous QHS  . insulin aspart  0-9 Units Subcutaneous TID WC  . insulin glargine  15 Units Subcutaneous QHS  . melatonin  3 mg Oral QHS  . metoCLOPramide  5 mg Oral BID  . oxyCODONE-acetaminophen      . polyethylene glycol  17 g Oral Daily  . rosuvastatin  10 mg Oral Daily  . senna-docusate  1 tablet Oral BID  . sodium chloride flush  3 mL Intravenous Q12H  . tiZANidine  2 mg Oral TID   Continuous Infusions: . sodium chloride Stopped (09/28/20 1354)    Consultants: Right upper extremity AV fistula placement  Procedures: Nephrology Vascular  surgery  Antimicrobials: Anti-infectives (From admission, onward)   Start     Dose/Rate Route Frequency Ordered Stop   10/02/20 1049  ceFAZolin (ANCEF) 2-4 GM/100ML-% IVPB       Note to Pharmacy: Claybon Jabs   : cabinet override      10/02/20 1049 10/02/20 2259   09/29/20 1330  ceFAZolin (ANCEF) 2-4 GM/100ML-% IVPB       Note to Pharmacy: Cecile Sheerer   : cabinet override      09/29/20 1330 09/30/20 0144   09/29/20 0000  ceFAZolin (ANCEF) 1 g in sodium chloride 0.9 % 100 mL IVPB        1 g 200 mL/hr over 30 Minutes Intravenous To Radiology 09/27/20 1457 09/29/20 1419     Culture/Microbiology No results found for: SDES, SPECREQUEST, CULT, REPTSTATUS  Other culture-see note  Objective: Vitals: Today's Vitals   10/13/20 1230 10/13/20 1308 10/13/20 1500 10/13/20 1504  BP: (!) 144/67 140/67 (!) 182/76 (!) 156/68  Pulse: 91 90 100 93  Resp:      Temp:  98.2 F (36.8 C)    TempSrc:  Oral    SpO2:   99% 99%  Weight:      Height:      PainSc:        Intake/Output Summary (Last 24 hours) at 10/13/2020 1731 Last data filed at 10/13/2020 1255 Gross per 24 hour  Intake --  Output 1508 ml  Net -1508 ml   Filed Weights   10/08/20 1230 10/10/20 1510 10/13/20 0915  Weight: 53.5 kg 59.5 kg 57.3 kg   Weight change:   Intake/Output from previous day: No intake/output data recorded. Intake/Output this shift: Total I/O In: -  Out: 1508 [Other:1508] Filed Weights   10/08/20 1230 10/10/20 1510 10/13/20 0915  Weight: 53.5 kg 59.5 kg 57.3 kg    Examination: General exam: Patient is awake and alert.  Patient is not in any distress.  Patient has right chest PermCath. HEENT: Mild pallor.  No jaundice.   Respiratory system: Clear to auscultation. Cardiovascular system: S1 & S2 mild systolic murmur.  Gastrointestinal system: Abdomen soft, NT,ND, BS+ Nervous System:Alert, awake.  Patient moves all extremities.   Extremities: No leg edema.  Data Reviewed: I have personally  reviewed following labs and imaging studies CBC: Recent Labs  Lab 10/07/20 0831 10/07/20 1634 10/08/20 0106 10/09/20 0233 10/10/20 0038 10/11/20 0111  WBC 9.8  --  10.1 8.9 7.7 6.9  HGB 9.1* 8.8* 8.6* 7.9* 8.0* 8.2*  HCT 27.1* 27.2* 27.3* 24.6* 24.6* 25.8*  MCV  90.3  --  94.5 92.5 91.1 91.8  PLT 198  --  195 199 218 [id number]   Basic Metabolic Panel: Recent Labs  Lab 10/09/20 0233 10/10/20 0038 10/11/20 0111 10/12/20 0419 10/13/20 0104  NA 128* 125* 130* 128* 128*  K 3.4* 3.5 3.3* 3.7 4.0  CL 94* 92* 95* 94* 94*  CO2 '24 24 28 27 24  '$ GLUCOSE 204* 415* 196* 147* 200*  BUN 13 26* 8 14 27*  CREATININE 3.46* 5.32* 2.85* 4.76* 6.54*  CALCIUM 8.1* 8.1* 8.1* 8.4* 8.2*  PHOS 3.1 3.3 2.4* 4.2 5.2*   GFR: Estimated Creatinine Clearance: 7.6 mL/min (A) (by C-G formula based on SCr of 6.54 mg/dL (H)). Liver Function Tests: Recent Labs  Lab 10/09/20 0233 10/10/20 0038 10/11/20 0111 10/12/20 0419 10/13/20 0104  ALBUMIN 2.2* 2.1* 2.3* 2.3* 2.2*   No results for input(s): LIPASE, AMYLASE in the last 168 hours. No results for input(s): AMMONIA in the last 168 hours. Coagulation Profile: No results for input(s): INR, PROTIME in the last 168 hours. Cardiac Enzymes: No results for input(s): CKTOTAL, CKMB, CKMBINDEX, TROPONINI in the last 168 hours. BNP (last 3 results) No results for input(s): PROBNP in the last 8760 hours. HbA1C: No results for input(s): HGBA1C in the last 72 hours. CBG: Recent Labs  Lab 10/12/20 1643 10/12/20 2100 10/13/20 0611 10/13/20 1210 10/13/20 1459  GLUCAP 221* 331* 180* 128* 152*   Lipid Profile: No results for input(s): CHOL, HDL, LDLCALC, TRIG, CHOLHDL, LDLDIRECT in the last 72 hours. Thyroid Function Tests: No results for input(s): TSH, T4TOTAL, FREET4, T3FREE, THYROIDAB in the last 72 hours. Anemia Panel: No results for input(s): VITAMINB12, FOLATE, FERRITIN, TIBC, IRON, RETICCTPCT in the last 72 hours. Sepsis Labs: No results for input(s):  PROCALCITON, LATICACIDVEN in the last 168 hours.  No results found for this or any previous visit (from the past 240 hour(s)).   Radiology Studies: No results found.   LOS: 16 days   Bonnell Public, MD [facility]  10/13/2020, 5:31 PM

## 2020-10-13 NOTE — Procedures (Signed)
Patient seen on Hemodialysis. BP (!) 132/58 (BP Location: Left Arm)   Pulse 89   Temp 98.1 F (36.7 C) (Oral)   Resp 18   Ht '5\' 2"'$  (1.575 m)   Wt 57.3 kg   SpO2 97%   BMI 23.10 kg/m   QB 300, UF goal 2.5L Tolerating treatment with complaints of back discomfort at this time.   Elmarie Shiley MD [facility]. Office # [phone] Pager # [phone] 10:20 AM

## 2020-10-14 DIAGNOSIS — Z8673 Personal history of transient ischemic attack (TIA), and cerebral infarction without residual deficits: Secondary | ICD-10-CM | POA: Diagnosis not present

## 2020-10-14 DIAGNOSIS — E8779 Other fluid overload: Secondary | ICD-10-CM | POA: Diagnosis not present

## 2020-10-14 DIAGNOSIS — I10 Essential (primary) hypertension: Secondary | ICD-10-CM | POA: Diagnosis not present

## 2020-10-14 DIAGNOSIS — N186 End stage renal disease: Secondary | ICD-10-CM | POA: Diagnosis not present

## 2020-10-14 LAB — RENAL FUNCTION PANEL
Albumin: 2.2 g/dL — ABNORMAL LOW (ref 3.5–5.0)
Anion gap: 6 (ref 5–15)
BUN: 12 mg/dL (ref 6–20)
CO2: 28 mmol/L (ref 22–32)
Calcium: 8.2 mg/dL — ABNORMAL LOW (ref 8.9–10.3)
Chloride: 97 mmol/L — ABNORMAL LOW (ref 98–111)
Creatinine, Ser: 3.36 mg/dL — ABNORMAL HIGH (ref 0.44–1.00)
GFR, Estimated: 15 mL/min — ABNORMAL LOW (ref >60)
Glucose, Bld: 177 mg/dL — ABNORMAL HIGH (ref 70–99)
Phosphorus: 3.1 mg/dL (ref 2.5–4.6)
Potassium: 3.9 mmol/L (ref 3.5–5.1)
Sodium: 131 mmol/L — ABNORMAL LOW (ref 135–145)

## 2020-10-14 LAB — GLUCOSE, CAPILLARY
Glucose-Capillary: 98 mg/dL (ref 70–99)
Glucose-Capillary: 192 mg/dL — ABNORMAL HIGH (ref 70–99)
Glucose-Capillary: 188 mg/dL — ABNORMAL HIGH (ref 70–99)
Glucose-Capillary: 171 mg/dL — ABNORMAL HIGH (ref 70–99)
Glucose-Capillary: 175 mg/dL — ABNORMAL HIGH (ref 70–99)

## 2020-10-14 MED ORDER — HYDROMORPHONE HCL 1 MG/ML IJ SOLN
0.2500 mg | Freq: Once | INTRAMUSCULAR | Status: AC
  Administered 2020-10-14: 0.25 mg via INTRAVENOUS

## 2020-10-14 MED ORDER — CARVEDILOL 6.25 MG PO TABS
6.2500 mg | ORAL_TABLET | ORAL | Status: AC
  Administered 2020-10-14: 6.25 mg via ORAL
  Filled 2020-10-14: qty 1

## 2020-10-14 MED ORDER — CARVEDILOL 12.5 MG PO TABS: 12.5000 mg | ORAL_TABLET | Freq: Two times a day (BID) | ORAL | Status: DC

## 2020-10-14 MED ORDER — CARVEDILOL 12.5 MG PO TABS
12.5000 mg | ORAL_TABLET | Freq: Two times a day (BID) | ORAL | Status: DC
  Administered 2020-10-14 – 2020-10-18 (×6): 12.5 mg via ORAL
  Filled 2020-10-14 (×7): qty 1

## 2020-10-14 NOTE — Progress Notes (Signed)
Occupational Therapy Treatment Patient Details Name: Hannah [name] MRN: [id number] DOB: September 12, 1965 Today's Date: 10/14/2020    History of present illness 55 y.o. female presented with shortness of breath and lower extremity swelling. Admitted on 09/26/20 with Volume overload.  Additionally, pt with ESRD and HD has been initiated this admission.  Pt underwent AV fistula placement 5/19.  Has RLE weakness and swelling in psoas mm on R, limited mobiltiy.  Pt with medical history significant of CKD 5, anemia, CVA, diabetes, hypertension, hyperlipidemia, multiple sclerosis   OT comments  Hannah [name] is progressing well, her daughter was present for this session. Pt ambulated with rw from the chair to the bahtroom given increased time for RLE management and min A for balance; pt noted with bilat knee flexion with gait. Pt completed grooming while sitting at the sink given set up A. Pt required mod A sit>supine for BLE management back into bed. Pt continues to benefit from acute OT to progress ADLs and mobility. D/c plan remains appropriate.    Follow Up Recommendations  CIR    Equipment Recommendations  None recommended by OT    Recommendations for Other Services Rehab consult    Precautions / Restrictions Precautions Precautions: Fall Precaution Comments: baseline RLE weak but has edema in psoas Required Braces or Orthoses: Other Brace Restrictions Weight Bearing Restrictions: No       Mobility Bed Mobility Overal bed mobility: Needs Assistance Bed Mobility: Sit to Supine Rolling: Mod assist Sidelying to sit: Min assist;HOB elevated       General bed mobility comments: mod A to manage BLE into bed    Transfers Overall transfer level: Needs assistance Equipment used: Rolling walker (2 wheeled) Transfers: Sit to/from Stand Sit to Stand: Min assist;From elevated surface         General transfer comment: min A to boost into standing and gain balance once standing    Balance  Overall balance assessment: Needs assistance Sitting-balance support: No upper extremity supported;Feet supported Sitting balance-Leahy Scale: Fair     Standing balance support: Bilateral upper extremity supported;During functional activity Standing balance-Leahy Scale: Poor      ADL either performed or assessed with clinical judgement   ADL Overall ADL's : Needs assistance/impaired     Grooming: Wash/dry hands;Wash/dry face;Oral care;Sitting;Min guard;Set up (at sink) Grooming Details (indicate cue type and reason): pt sitting in chair at the sink to complete grooming tasks                             Functional mobility during ADLs: Minimal assistance;Cueing for safety;Cueing for sequencing;Rolling walker General ADL Comments: Pt ambulated with rw to bathroom to complete grooming tasks, min A for balance and safety given RLE weakness           Praxis      Cognition Arousal/Alertness: Awake/alert Behavior During Therapy: WFL for tasks assessed/performed Overall Cognitive Status: Within Functional Limits for tasks assessed          General Comments: speech changes from stroke but is on point in conversation              General Comments pt with no new skin intrgrity concerns    Pertinent Vitals/ Pain       Pain Assessment: Faces Pain Score: 5  Faces Pain Scale: Hurts a little bit Pain Location: R hip Pain Descriptors / Indicators: Grimacing;Guarding Pain Intervention(s): Limited activity within patient's tolerance;Monitored during session  Frequency  Min 2X/week        Progress Toward Goals  OT Goals(current goals can now be found in the care plan section)  Progress towards OT goals: Progressing toward goals  Acute Rehab OT Goals Patient Stated Goal: rehab before home OT Goal Formulation: With patient Time For Goal Achievement: 10/15/20 Potential to Achieve Goals: Good ADL Goals Pt Will Perform Grooming: with  supervision;standing Pt Will Perform Upper Body Bathing: with supervision;sitting Pt Will Perform Lower Body Bathing: with supervision;sit to/from stand Pt Will Perform Lower Body Dressing: with modified independence;sit to/from stand Pt Will Transfer to Toilet: with supervision;ambulating;bedside commode Pt Will Perform Toileting - Clothing Manipulation and hygiene: with supervision;sit to/from stand Additional ADL Goal #1: Pt will be Mod in and OOB for basic ADLs (increaased time) Additional ADL Goal #2: Patient will identify at least 3 energy conservation strategies to employ at home in order to maximize function and quality of life and decrease caregiver burden while preventing exacerbation of symptoms and rehospitalization.  Plan Discharge plan remains appropriate;Frequency remains appropriate       AM-PAC OT "6 Clicks" Daily Activity     Outcome Measure   Help from another person eating meals?: A Little Help from another person taking care of personal grooming?: A Little Help from another person toileting, which includes using toliet, bedpan, or urinal?: A Little Help from another person bathing (including washing, rinsing, drying)?: A Lot Help from another person to put on and taking off regular upper body clothing?: A Little Help from another person to put on and taking off regular lower body clothing?: A Lot 6 Click Score: 16    End of Session Equipment Utilized During Treatment: Gait belt;Rolling walker  OT Visit Diagnosis: Unsteadiness on feet (R26.81);Muscle weakness (generalized) (M62.81);Hemiplegia and hemiparesis;Other abnormalities of gait and mobility (R26.89) Hemiplegia - Right/Left: Right Hemiplegia - dominant/non-dominant: Dominant Hemiplegia - caused by: Cerebral infarction Pain - Right/Left: Right Pain - part of body: Hip;Leg   Activity Tolerance Patient tolerated treatment well   Patient Left in bed;with call bell/phone within reach;with bed alarm set;with  nursing/sitter in room (RN completing bladder scan)   Nurse Communication Mobility status        Time: [id number] OT Time Calculation (min): 16 min  Charges: OT General Charges $OT Visit: 1 Visit OT Treatments $Self Care/Home Management : 8-22 mins    Kathie Posa A Glinda Natzke 10/14/2020, 4:36 PM

## 2020-10-14 NOTE — Progress Notes (Signed)
Physical Therapy Treatment Patient Details Name: Hannah [name] MRN: [id number] DOB: 1965-05-27 Today's Date: 10/14/2020    History of Present Illness 55 y.o. female presented with shortness of breath and lower extremity swelling. Admitted on 09/26/20 with Volume overload.  Additionally, pt with ESRD and HD has been initiated this admission.  Pt underwent AV fistula placement 5/19.  Has RLE weakness and swelling in psoas mm on R, limited mobiltiy.  Pt with medical history significant of CKD 5, anemia, CVA, diabetes, hypertension, hyperlipidemia, multiple sclerosis    PT Comments    Pt supine in bed on arrival.  Pt reports she is ready to move this pm and feeling better.  Noticeable weakness present from her baseline level of function.  Pt would benefit from aggressive short term rehab stay to improve strength and function before returning home.    Follow Up Recommendations  CIR     Equipment Recommendations  None recommended by PT    Recommendations for Other Services       Precautions / Restrictions Precautions Precautions: Fall Precaution Comments: baseline RLE weak but has edema in psoas Required Braces or Orthoses: Other Brace Restrictions Weight Bearing Restrictions: No    Mobility  Bed Mobility Overal bed mobility: Needs Assistance Bed Mobility: Supine to Sit Rolling: Min guard Sidelying to sit: Min assist;HOB elevated       General bed mobility comments: Continues to require min assistance to elevate trunk into sitting.    Transfers Overall transfer level: Needs assistance Equipment used: Rolling walker (2 wheeled) Transfers: Sit to/from Stand Sit to Stand: Min assist;From elevated surface         General transfer comment: Increased time and effort to rise into standing.  Min assistance to boost into standing.  Ambulation/Gait Ambulation/Gait assistance: Min assist Gait Distance (Feet): 20 Feet Assistive device: Rolling walker (2 wheeled) Gait  Pattern/deviations: Step-to pattern;Decreased stance time - right;Decreased dorsiflexion - right;Decreased stride length Gait velocity: decreased   General Gait Details: Min assistance for safety with poor ability to keep RW close to her person for safety.  LOB x1.  Pt fatigues as gt trial increases.   Stairs             Wheelchair Mobility    Modified Rankin (Stroke Patients Only) Modified Rankin (Stroke Patients Only) Pre-Morbid Rankin Score: Moderate disability Modified Rankin: Moderately severe disability     Balance Overall balance assessment: Needs assistance Sitting-balance support: No upper extremity supported;Feet supported Sitting balance-Leahy Scale: Fair       Standing balance-Leahy Scale: Poor                              Cognition Arousal/Alertness: Awake/alert Behavior During Therapy: WFL for tasks assessed/performed Overall Cognitive Status: Within Functional Limits for tasks assessed                                        Exercises      General Comments        Pertinent Vitals/Pain Pain Assessment: No/denies pain Pain Score: 5  Faces Pain Scale: No hurt Pain Location: R hip Pain Descriptors / Indicators: Grimacing;Guarding Pain Intervention(s): Monitored during session;Repositioned    Home Living                      Prior Function  PT Goals (current goals can now be found in the care plan section) Acute Rehab PT Goals Patient Stated Goal: rehab before home Potential to Achieve Goals: Good Progress towards PT goals: Progressing toward goals    Frequency    Min 3X/week      PT Plan Current plan remains appropriate    Co-evaluation              AM-PAC PT "6 Clicks" Mobility   Outcome Measure  Help needed turning from your back to your side while in a flat bed without using bedrails?: A Little Help needed moving from lying on your back to sitting on the side of a flat  bed without using bedrails?: A Little Help needed moving to and from a bed to a chair (including a wheelchair)?: A Little Help needed standing up from a chair using your arms (e.g., wheelchair or bedside chair)?: A Little Help needed to walk in hospital room?: A Little Help needed climbing 3-5 steps with a railing? : A Lot 6 Click Score: 17    End of Session Equipment Utilized During Treatment: Gait belt Activity Tolerance: Patient tolerated treatment well Patient left: with call bell/phone within reach;in chair Nurse Communication: Mobility status PT Visit Diagnosis: Other abnormalities of gait and mobility (R26.89);Muscle weakness (generalized) (M62.81);Difficulty in walking, not elsewhere classified (R26.2);Adult, failure to thrive (R62.7)     Time: [id number] PT Time Calculation (min) (ACUTE ONLY): 20 min  Charges:  $Gait Training: 8-22 mins                     Erasmo Leventhal , PTA Acute Rehabilitation Services Pager [phone] Office [place] 10/14/2020, 4:08 PM

## 2020-10-14 NOTE — Progress Notes (Signed)
PROGRESS NOTE Hannah [name]  [id number] DOB: 01/08/1966 DOA: 09/26/2020 PCP: Vernie Shanks, MD   Chief Complaint  Patient presents with  . Leg Swelling   Brief Narrative: Patient is a 55 year old African-American female with past medical history significant for CKD 5, anemia, CVA, hypertension, diabetes, hyperlipidemia and multiple sclerosis.  Patient was admitted with shortness of breath, lower extremity swelling and concerns for possible uremia.  Patient was admitted for further assessment and management.  Nephrology team was consulted to assist with patient's management.  Patient was deemed to have progressed to end-stage renal disease.  Patient has been on hemodialysis (new start).  Hospital course has been complicated by deconditioning and retroperitoneal bleed.  Patient needs to be able to sit for 4 hours for patient to be suitable for outpatient hemodialysis.  Physical therapy team and Occupational Therapy team are recommended CIR.  Patient's insurance has denied CIR, however, case management and transition of care team plan to appeal the decision.  During the hospital stay, patient has been asking for significant amount of IV Dilaudid.  The dose and frequency of IV Dilaudid have been cut down and, patient seems to be undergoing withdrawal.  Oral opiates have also been prescribed.    Subjective: -No shortness of breath. -No chest pain. -No fever or chills. -Reports not feeling well.  Assessment & Plan:  New ESRD on hemodialysis/ Volume overload/uremia/metabolic acidosis: -Now on HD MWF -AV fistula was created on 10/02/2020. -Nephrology is directing ESRD care. -Awaiting insurance approval for CIR.    Recent Labs  Lab 10/10/20 0038 10/11/20 0111 10/12/20 0419 10/13/20 0104 10/14/20 0147  BUN 26* 8 14 27* 12  CREATININE 5.32* 2.85* 4.76* [id number]* [id number]*   Metabolic bone disease: -PTH 84   Essential hypertension -Continue to optimize. -Goal blood pressure should be less  than 130/80 mmHg. -Optimize management for opiates withdrawal.  Hypokalemia -improved. 10/11/2020: Potassium today is 3.3. 10/12/2020: Potassium today is 3.7.  Type 2 diabetes mellitus: -HbA1c of 8.7%. Continue long-acting insulin and sliding scale insulin coverage.   -Optimize blood sugar control. Recent Labs  Lab 10/13/20 1459 10/13/20 2046 10/14/20 0634 10/14/20 1112 10/14/20 1625  GLUCAP 152* 159* 192* 188* 171*   History of  Recent CVA in jan 2022 w/ Rt sided spasticweakness/Hyperlipidemia: -Continue on Crestor andPlavix  -PT and OT have recommended CIR.   -CT head this admission revealed moderate extensive white matter hypodensity bilaterally similar to prior CT and MRI may represent demyelinating disease with history of multiple sclerosis. -Patient will need follow-up with neurology when discharged from the hospital.    Acute blood loss anemia needing transfusion 1 unit prbc Anemia of chronic disease/renal disease: -Nephrology is directing care. -Patient has been managed to ferric gluconate and ESA. -Patient was also transfused with 1 unit of packed red blood cells on 10/05/2020.    Recent Labs  Lab 10/08/20 0106 10/09/20 0233 10/10/20 0038 10/11/20 0111  HGB 8.6* 7.9* 8.0* 8.2*  HCT 27.3* 24.6* 24.6* 25.8*   Right flank pain/Rt groin pain/Rt psoas enlargement with right psoas hematoma: -CT pelvis/lumbar spine- showed right psoas enlargement most compatible with intramuscular hematoma, infection possible but at this time patient does not have any fever spike or leukocytosis. discussed with vascular surgery Dr Carlis Abbott- advised conservative management- also  discussed with Dr. Donne Hazel from general surgery 5/25- advised to continue with pain management antispasmodic hold Plavix for 2 weeks and no surgery needed.   Left abdomen wall hematoma at heparin inj site- off heparin.  Monitor  Multiple sclerosis:??MS for years as per daughter patient was supposed to have a  follow-up with Dr. Leonie Man from last discharge back in February but it appears she has not followed up.  Patient will need to follow-up with neurology on discharge. CT head this admission showed similar white matter hypodensity bilaterally. Diet Order            Diet renal/carb modified with fluid restriction Diet-HS Snack? Nothing; Fluid restriction: 1200 mL Fluid; Room service appropriate? Yes; Fluid consistency: Thin  Diet effective now               Patient's Body mass index is 22.5 kg/m. DVT prophylaxis:  Code Status:   Code Status: Full Code   Family Communication: plan of care discussed with patient at bedside. Plan of care was extensively discussed with patient's daughters previously.  Status is: Inpatient. Remains inpatient appropriate because:IV treatments appropriate due to intensity of illness or inability to take PO and Inpatient level of care appropriate due to severity of illness  Dispo: The patient is from: Home lives with the daughter             Anticipated d/c: Awaiting CIR placement.              Patient currently is medically stable   Difficult to place patient No  Unresulted Labs (From admission, onward)          Start     Ordered   09/28/20 0500  Renal function panel  Daily,   R      09/27/20 1218   Signed and Held  Hepatitis B surface antigen  (New Patient Hemo Labs (Hepatitis B))  Once,   R        Signed and Held   Signed and Held  Hepatitis B surface antibody  (New Patient [facility] Labs (Hepatitis B))  Once,   R        Signed and Held   Signed and Held  Hepatitis B core antibody, total  (New Patient [facility] (Hepatitis B))  Once,   R        Signed and Held   Signed and Held  Hepatitis C antibody  (New Patient [facility] (Hepatitis B))  Once,   R        Signed and Held   Signed and Held  CBC  Once,   R        Signed and Held         Medications reviewed:  Scheduled Meds: . amLODipine  10 mg Oral Daily  . carvedilol  12.5 mg Oral BID WC  .  Chlorhexidine Gluconate Cloth  6 each Topical Q0600  . Chlorhexidine Gluconate Cloth  6 each Topical Q0600  . darbepoetin (ARANESP) injection - DIALYSIS  200 mcg Intravenous Q Mon-HD  . diclofenac Sodium  2 g Topical QID  . famotidine  20 mg Oral Daily  . gabapentin  100 mg Oral QHS  . hydrALAZINE  50 mg Oral Q8H  . insulin aspart  0-5 Units Subcutaneous QHS  . insulin aspart  0-9 Units Subcutaneous TID WC  . insulin glargine  15 Units Subcutaneous QHS  . melatonin  3 mg Oral QHS  . metoCLOPramide  5 mg Oral BID  . polyethylene glycol  17 g Oral Daily  . rosuvastatin  10 mg Oral Daily  . senna-docusate  1 tablet Oral BID  . sodium chloride flush  3 mL Intravenous Q12H  . tiZANidine  2 mg  Oral TID   Continuous Infusions: . sodium chloride Stopped (09/28/20 1354)    Consultants: Right upper extremity AV fistula placement  Procedures: Nephrology Vascular surgery  Antimicrobials: Anti-infectives (From admission, onward)   Start     Dose/Rate Route Frequency Ordered Stop   10/02/20 1049  ceFAZolin (ANCEF) 2-4 GM/100ML-% IVPB       Note to Pharmacy: Claybon Jabs   : cabinet override      10/02/20 1049 10/02/20 2259   09/29/20 1330  ceFAZolin (ANCEF) 2-4 GM/100ML-% IVPB       Note to Pharmacy: Cecile Sheerer   : cabinet override      09/29/20 1330 09/30/20 0144   09/29/20 0000  ceFAZolin (ANCEF) 1 g in sodium chloride 0.9 % 100 mL IVPB        1 g 200 mL/hr over 30 Minutes Intravenous To Radiology 09/27/20 1457 09/29/20 1419     Culture/Microbiology No results found for: SDES, SPECREQUEST, CULT, REPTSTATUS  Other culture-see note  Objective: Vitals: Today's Vitals   10/14/20 1107 10/14/20 1128 10/14/20 1351 10/14/20 1500  BP:  (!) 200/81 (!) 192/81 (!) 159/75  Pulse:  98    Resp:      Temp:  97.9 F (36.6 C)    TempSrc:  Oral    SpO2:  100%    Weight:      Height:      PainSc: 2        Intake/Output Summary (Last 24 hours) at 10/14/2020 1857 Last data filed  at 10/14/2020 1700 Gross per 24 hour  Intake --  Output 1030 ml  Net -1030 ml   Filed Weights   10/10/20 1510 10/13/20 0915 10/13/20 1308  Weight: 59.5 kg 57.3 kg 55.8 kg   Weight change:   Intake/Output from previous day: 05/30 0701 - 05/31 0700 In: -  Out: 2008 [Urine:500] Intake/Output this shift: Total I/O In: -  Out: 530 [Urine:530] Filed Weights   10/10/20 1510 10/13/20 0915 10/13/20 1308  Weight: 59.5 kg 57.3 kg 55.8 kg    Examination: General exam: Patient is awake and alert.  Patient is not in any distress.  Patient has right chest PermCath. HEENT: Mild pallor.  No jaundice.   Respiratory system: Clear to auscultation. Cardiovascular system: S1 & S2 mild systolic murmur.  Gastrointestinal system: Abdomen soft, NT,ND, BS+ Nervous System:Alert, awake.  Patient moves all extremities.   Extremities: No leg edema.  Data Reviewed: I have personally reviewed following labs and imaging studies CBC: Recent Labs  Lab 10/08/20 0106 10/09/20 0233 10/10/20 0038 10/11/20 0111  WBC 10.1 8.9 7.7 6.9  HGB 8.6* 7.9* 8.0* 8.2*  HCT 27.3* 24.6* 24.6* 25.8*  MCV 94.5 92.5 91.1 91.8  PLT 195 199 218 [id number]   Basic Metabolic Panel: Recent Labs  Lab 10/10/20 0038 10/11/20 0111 10/12/20 0419 10/13/20 0104 10/14/20 0147  NA 125* 130* 128* 128* 131*  K 3.5 3.3* 3.7 4.0 3.9  CL 92* 95* 94* 94* 97*  CO2 '24 28 27 24 28  '$ GLUCOSE 415* 196* 147* 200* 177*  BUN 26* 8 14 27* 12  CREATININE 5.32* 2.85* 4.76* 6.54* 3.36*  CALCIUM 8.1* 8.1* 8.4* 8.2* 8.2*  PHOS 3.3 2.4* 4.2 5.2* 3.1   GFR: Estimated Creatinine Clearance: 14.8 mL/min (A) (by C-G formula based on SCr of 3.36 mg/dL (H)). Liver Function Tests: Recent Labs  Lab 10/10/20 0038 10/11/20 0111 10/12/20 0419 10/13/20 0104 10/14/20 0147  ALBUMIN 2.1* 2.3* 2.3* 2.2* 2.2*   No results for  input(s): LIPASE, AMYLASE in the last 168 hours. No results for input(s): AMMONIA in the last 168 hours. Coagulation Profile: No  results for input(s): INR, PROTIME in the last 168 hours. Cardiac Enzymes: No results for input(s): CKTOTAL, CKMB, CKMBINDEX, TROPONINI in the last 168 hours. BNP (last 3 results) No results for input(s): PROBNP in the last 8760 hours. HbA1C: No results for input(s): HGBA1C in the last 72 hours. CBG: Recent Labs  Lab 10/13/20 1459 10/13/20 2046 10/14/20 0634 10/14/20 1112 10/14/20 1625  GLUCAP 152* 159* 192* 188* 171*   Lipid Profile: No results for input(s): CHOL, HDL, LDLCALC, TRIG, CHOLHDL, LDLDIRECT in the last 72 hours. Thyroid Function Tests: No results for input(s): TSH, T4TOTAL, FREET4, T3FREE, THYROIDAB in the last 72 hours. Anemia Panel: No results for input(s): VITAMINB12, FOLATE, FERRITIN, TIBC, IRON, RETICCTPCT in the last 72 hours. Sepsis Labs: No results for input(s): PROCALCITON, LATICACIDVEN in the last 168 hours.  No results found for this or any previous visit (from the past 240 hour(s)).   Radiology Studies: No results found.   LOS: 17 days   Bonnell Public, MD [facility]  10/14/2020, 6:57 PM

## 2020-10-14 NOTE — Progress Notes (Signed)
Patient ID: Hannah [name], female   DOB: [birth year], 55 y.o.   MRN: [id number] [facility] Progress Note   Assessment/ Plan:   1.  End-stage renal disease: From progressive chronic kidney disease and presentation with uremic symptoms needing initiation of dialysis.  Status post right brachiobasilic AV fistula by Dr. Donzetta Matters on 5/19 and getting dialysis via [facility] on MWF schedule.  Awaiting admission to inpatient rehabilitation unit for deconditioning/management status post psoas hematoma with ongoing pain management. 2.  Hypertension: Blood pressure remains elevated on amlodipine 10 mg daily, carvedilol 6.25 mg twice daily and hydralazine 50 mg 3 times daily-we will increase carvedilol to 12.5 mg twice a day 3.  Anemia of chronic disease: Without overt blood loss and anticipated to have a drop from source hematoma.  We will continue to follow closely on high-dose ESA. 4.  Secondary hyperparathyroidism: Calcium and phosphorus level within acceptable range, PTH at goal at 84.  Continue to monitor on renal diet. 5.  History of recent CVA with right-sided spastic hemiparesis  Subjective:   Reports to be feeling fair with improved control of back pain but insomnia overnight-somewhat fatigued this morning.   Objective:   BP (!) 192/75 (BP Location: Left Arm)   Pulse (!) 102   Temp 99 F (37.2 C) (Oral)   Resp 17   Ht '5\' 2"'$  (1.575 m)   Wt 55.8 kg   SpO2 97%   BMI 22.50 kg/m   Intake/Output Summary (Last 24 hours) at 10/14/2020 1013 Last data filed at 10/14/2020 0604 Gross per 24 hour  Intake --  Output 2008 ml  Net -2008 ml   Weight change:   Physical Exam: Gen: Appears comfortable sitting up in recliner, interactive CVS: Pulse regular tachycardia, S1 and S2 normal Resp: Clear to auscultation bilaterally without distinct rales or rhonchi.  Right IJ TDC in place Abd: Soft, obese, nontender, bowel sounds normal Ext: No lower extremity edema, right upper arm AV fistula with  palpable thrill  Imaging: No results found.  Labs: BMET Recent Labs  Lab 10/08/20 0106 10/09/20 0233 10/10/20 0038 10/11/20 0111 10/12/20 0419 10/13/20 0104 10/14/20 0147  NA 128* 128* 125* 130* 128* 128* 131*  K 3.9 3.4* 3.5 3.3* 3.7 4.0 3.9  CL 93* 94* 92* 95* 94* 94* 97*  CO2 '22 24 24 28 27 24 28  '$ GLUCOSE 242* 204* 415* 196* 147* 200* 177*  BUN 24* 13 26* 8 14 27* 12  CREATININE 5.50* 3.46* 5.32* 2.85* 4.76* 6.54* 3.36*  CALCIUM 8.2* 8.1* 8.1* 8.1* 8.4* 8.2* 8.2*  PHOS 3.2 3.1 3.3 2.4* 4.2 5.2* 3.1   CBC Recent Labs  Lab 10/08/20 0106 10/09/20 0233 10/10/20 0038 10/11/20 0111  WBC 10.1 8.9 7.7 6.9  HGB 8.6* 7.9* 8.0* 8.2*  HCT 27.3* 24.6* 24.6* 25.8*  MCV 94.5 92.5 91.1 91.8  PLT 195 199 218 265    Medications:    . amLODipine  10 mg Oral Daily  . carvedilol  6.25 mg Oral BID WC  . Chlorhexidine Gluconate Cloth  6 each Topical Q0600  . Chlorhexidine Gluconate Cloth  6 each Topical Q0600  . darbepoetin (ARANESP) injection - DIALYSIS  200 mcg Intravenous Q Mon-HD  . diclofenac Sodium  2 g Topical QID  . famotidine  20 mg Oral Daily  . gabapentin  100 mg Oral QHS  . hydrALAZINE  50 mg Oral Q8H  . insulin aspart  0-5 Units Subcutaneous QHS  . insulin aspart  0-9 Units Subcutaneous TID WC  .  insulin glargine  15 Units Subcutaneous QHS  . melatonin  3 mg Oral QHS  . metoCLOPramide  5 mg Oral BID  . polyethylene glycol  17 g Oral Daily  . rosuvastatin  10 mg Oral Daily  . senna-docusate  1 tablet Oral BID  . sodium chloride flush  3 mL Intravenous Q12H  . tiZANidine  2 mg Oral TID   Elmarie Shiley, MD 10/14/2020, 10:13 AM

## 2020-10-14 NOTE — Progress Notes (Signed)
OT Cancellation Note  Patient Details Name: Hannah [name] MRN: [id number] DOB: May 29, 1965   Cancelled Treatment:    Reason Eval/Treat Not Completed: Medical issues which prohibited therapy (Upon arrival, pt observed with full body tremor, crying and had reports of nausea and vomitting. RN made aware. Will attempt tx session later today if time permits.)  Chaundra Abreu A [place] Krotz 10/14/2020, 11:30 AM

## 2020-10-15 DIAGNOSIS — N189 Chronic kidney disease, unspecified: Secondary | ICD-10-CM

## 2020-10-15 DIAGNOSIS — N186 End stage renal disease: Secondary | ICD-10-CM | POA: Diagnosis not present

## 2020-10-15 DIAGNOSIS — N179 Acute kidney failure, unspecified: Secondary | ICD-10-CM

## 2020-10-15 DIAGNOSIS — E8770 Fluid overload, unspecified: Secondary | ICD-10-CM | POA: Diagnosis not present

## 2020-10-15 DIAGNOSIS — N185 Chronic kidney disease, stage 5: Secondary | ICD-10-CM | POA: Diagnosis not present

## 2020-10-15 LAB — RENAL FUNCTION PANEL
Albumin: 2.4 g/dL — ABNORMAL LOW (ref 3.5–5.0)
Anion gap: 9 (ref 5–15)
BUN: 19 mg/dL (ref 6–20)
CO2: 27 mmol/L (ref 22–32)
Calcium: 9 mg/dL (ref 8.9–10.3)
Chloride: 92 mmol/L — ABNORMAL LOW (ref 98–111)
Creatinine, Ser: 4.81 mg/dL — ABNORMAL HIGH (ref 0.44–1.00)
GFR, Estimated: 10 mL/min — ABNORMAL LOW (ref >60)
Glucose, Bld: 134 mg/dL — ABNORMAL HIGH (ref 70–99)
Phosphorus: 3.9 mg/dL (ref 2.5–4.6)
Potassium: 3.7 mmol/L (ref 3.5–5.1)
Sodium: 128 mmol/L — ABNORMAL LOW (ref 135–145)

## 2020-10-15 LAB — CBC
HCT: 31.7 % — ABNORMAL LOW (ref 36.0–46.0)
Hemoglobin: 9.8 g/dL — ABNORMAL LOW (ref 12.0–15.0)
MCH: 29.3 pg (ref 26.0–34.0)
MCHC: 30.9 g/dL (ref 30.0–36.0)
MCV: 94.9 fL (ref 80.0–100.0)
Platelets: 405 10*3/uL — ABNORMAL HIGH (ref 150–400)
RBC: 3.34 MIL/uL — ABNORMAL LOW (ref 3.87–5.11)
RDW: 18.6 % — ABNORMAL HIGH (ref 11.5–15.5)
WBC: 8.6 10*3/uL (ref 4.0–10.5)
nRBC: 1.3 % — ABNORMAL HIGH (ref 0.0–0.2)

## 2020-10-15 LAB — GLUCOSE, CAPILLARY
Glucose-Capillary: 117 mg/dL — ABNORMAL HIGH (ref 70–99)
Glucose-Capillary: 140 mg/dL — ABNORMAL HIGH (ref 70–99)
Glucose-Capillary: 159 mg/dL — ABNORMAL HIGH (ref 70–99)
Glucose-Capillary: 296 mg/dL — ABNORMAL HIGH (ref 70–99)

## 2020-10-15 MED ORDER — HYDROMORPHONE HCL 1 MG/ML IJ SOLN
INTRAMUSCULAR | Status: AC
  Administered 2020-10-15: 0.25 mg via INTRAVENOUS
  Filled 2020-10-15: qty 0.5

## 2020-10-15 MED ORDER — HEPARIN SODIUM (PORCINE) 1000 UNIT/ML IJ SOLN
INTRAMUSCULAR | Status: AC
  Filled 2020-10-15: qty 4

## 2020-10-15 NOTE — Progress Notes (Addendum)
Renal Navigator updated [facility] outpatient HD clinic on current status of disposition planning for patient per notes. [facility] has provided patient with a TTS schedule, seat time of 11:45am. She needs to arrive to her appointments at 11:25am.  On her first day at her clinic, patient needs to arrive at 11:00am to complete intake paperwork prior to her first treatment. *If patient's first appointment falls on a Saturday, patient must go to the clinic the Friday prior to complete intake paperwork between the hours of 8am-4pm.  Renal Navigator updated Nephrologist regarding seat schedule and left message for patient's daughter. Schedule letter given to patient. Navigator will continue to follow closely for discharge planning.   Alphonzo Cruise, [place] Renal Navigator [phone]

## 2020-10-15 NOTE — Progress Notes (Signed)
Patient ID: Hannah [name], female   DOB: Aug 03, 1965, 55 y.o.   MRN: [id number] [facility] Progress Note   Assessment/ Plan:   1.  End-stage renal disease: From progressive chronic kidney disease and presentation with uremic symptoms needing initiation of dialysis.  Status post right brachiobasilic AV fistula by Dr. Donzetta Matters on 5/19 and on schedule for dialysis today via [facility] (MWF while here).  Awaiting admission to inpatient rehabilitation unit for deconditioning/management status post psoas hematoma with ongoing pain management. 2.  Hypertension: Blood pressure improving with titration of anti-hypertensive therapy and pain management.  3.  Anemia of chronic disease: Without overt blood loss and anticipated to have a drop from source hematoma.  We will continue to follow closely on high-dose ESA. 4.  Secondary hyperparathyroidism: Calcium and phosphorus level within acceptable range, PTH at goal at 84.  Continue to monitor on renal diet. 5.  History of recent CVA with right-sided spastic hemiparesis  Subjective:   Improving back pain and awaiting decision on CIR admission.   Objective:   BP (!) 122/59 (BP Location: Left Arm)   Pulse 97   Temp 98.9 F (37.2 C) (Oral)   Resp 18   Ht '5\' 2"'$  (1.575 m)   Wt 59.1 kg   SpO2 96%   BMI 23.83 kg/m   Intake/Output Summary (Last 24 hours) at 10/15/2020 1043 Last data filed at 10/15/2020 0749 Gross per 24 hour  Intake 240 ml  Output 530 ml  Net -290 ml   Weight change: 1.8 kg  Physical Exam: Gen: Comfortably sitting up in recliner, interactive CVS: Pulse regular tachycardia, S1 and S2 normal Resp: Clear to auscultation bilaterally without distinct rales or rhonchi.  Right IJ TDC in place Abd: Soft, obese, nontender, bowel sounds normal Ext: No lower extremity edema, right upper arm AV fistula with palpable thrill  Imaging: No results found.  Labs: BMET Recent Labs  Lab 10/09/20 0233 10/10/20 0038 10/11/20 0111 10/12/20 0419  10/13/20 0104 10/14/20 0147 10/15/20 0239  NA 128* 125* 130* 128* 128* 131* 128*  K 3.4* 3.5 3.3* 3.7 4.0 3.9 3.7  CL 94* 92* 95* 94* 94* 97* 92*  CO2 '24 24 28 27 24 28 27  '$ GLUCOSE 204* 415* 196* 147* 200* 177* 134*  BUN 13 26* 8 14 27* 12 19  CREATININE 3.46* 5.32* 2.85* 4.76* 6.54* 3.36* 4.81*  CALCIUM 8.1* 8.1* 8.1* 8.4* 8.2* 8.2* 9.0  PHOS 3.1 3.3 2.4* 4.2 5.2* 3.1 3.9   CBC Recent Labs  Lab 10/09/20 0233 10/10/20 0038 10/11/20 0111  WBC 8.9 7.7 6.9  HGB 7.9* 8.0* 8.2*  HCT 24.6* 24.6* 25.8*  MCV 92.5 91.1 91.8  PLT 199 218 265    Medications:    . amLODipine  10 mg Oral Daily  . carvedilol  12.5 mg Oral BID WC  . Chlorhexidine Gluconate Cloth  6 each Topical Q0600  . Chlorhexidine Gluconate Cloth  6 each Topical Q0600  . darbepoetin (ARANESP) injection - DIALYSIS  200 mcg Intravenous Q Mon-HD  . diclofenac Sodium  2 g Topical QID  . famotidine  20 mg Oral Daily  . gabapentin  100 mg Oral QHS  . hydrALAZINE  50 mg Oral Q8H  . insulin aspart  0-5 Units Subcutaneous QHS  . insulin aspart  0-9 Units Subcutaneous TID WC  . insulin glargine  15 Units Subcutaneous QHS  . melatonin  3 mg Oral QHS  . metoCLOPramide  5 mg Oral BID  . polyethylene glycol  17 g Oral Daily  . rosuvastatin  10 mg Oral Daily  . senna-docusate  1 tablet Oral BID  . sodium chloride flush  3 mL Intravenous Q12H  . tiZANidine  2 mg Oral TID   Elmarie Shiley, MD 10/15/2020, 10:43 AM

## 2020-10-15 NOTE — Progress Notes (Signed)
PT Cancellation Note  Patient Details Name: Hannah [name] MRN: [id number] DOB: May 25, 1965   Cancelled Treatment:    Reason Eval/Treat Not Completed: Patient at procedure or test/unavailable (Pt off unit for HD, will f/u per POC.)   Taelyn Broecker J Stann Mainland 10/15/2020, 3:34 PM  Erasmo Leventhal , PTA Acute Rehabilitation Services Pager [phone] Office [phone]

## 2020-10-15 NOTE — Progress Notes (Signed)
[facility]                                                                              Patient Demographics  Hannah [name], is a 55 y.o. female, DOB - [birth year], [id number]  Admit date - 09/26/2020   Admitting Physician Hannah Public, MD  Outpatient Primary MD for the patient is Hannah Shanks, MD  Outpatient specialists:   LOS - 18  days   Medical records reviewed and are as summarized below:    Chief Complaint  Patient presents with  . Leg Swelling       Brief summary   Patient is a 55 year old female with CKD stage V, anemia, CVA, hypertension, diabetes, hyperlipidemia, MS presented with shortness of breath, lower extremity swelling and concerns for possible uremia. Nephrology consulted, was deemed to have progressed to ESRD.  Patient started on hemodialysis.  Hospitalization complicated by deconditioning and retroperitoneal bleed. Has been declined for CIR, now discharged appealed.  Assessment & Plan    Principal Problem: New ESRD on hemodialysis, presented with volume overload, metabolic acidosis, uremia -Nephrology consulted, AV fistula created on 10/02/2020 by Dr. Donzetta Matters -Started on hemodialysis with [facility], MWF while inpatient  -Awaiting admission to SNF -Patient has been accepted to [facility] outpatient HD clinic on TTS schedule with chair time of 11:45 AM and arrive to her appointment is at 11:25 AM  Active Problems: Right flank pain/right groin pain, psoas enlargement with right psoas hematoma -CT pelvis, lumbar spine showed right psoas enlargement most compatible with intramuscular hematoma, infection possible -Continue conservative management per vascular surgery, Dr. Carlis Abbott. -Dr. Marthenia Rolling discussed with Dr. Donne Hazel on 5/25, recommended to hold Plavix for 2 weeks, no surgery needed and continue pain management  Essential hypertension -BP improving  Diabetes mellitus type 2, uncontrolled with CKD progressed to  ESRD, on long-term  insulin -Hemoglobin A1c 8.7 on 09/13/2020 -Monitor insulin regimen closely with starting hemodialysis -Continue Lantus 15 units at bedtime, sliding scale insulin  History of recent CVA in 05/2020 with residual right-sided weakness Hyperlipidemia -Plavix currently on hold, continue Crestor -Awaiting rehab -CT head showed moderate extensive white matter hypodensities bilaterally similar to prior CT.  MRI may represent demyelinating disease with history of MS. -Outpatient follow-up with neurology  AB LA with anemia of chronic disease/ESRD -Patient was transfused 1 unit packed RBCs on 5/22, started on ESA -H&H currently stable  Left abdominal wall hematoma -At heparin injection site, monitor off heparin  History of chronic multiple sclerosis -Outpatient follow-up with neurology/ Dr Leonie Man.  No acute issues  Code Status: Full CODE STATUS DVT Prophylaxis:  SCDs   Level of Care: Level of care: Telemetry Medical Family Communication: Discussed all imaging results, lab results, explained to the patient   Disposition Plan:     Status is: Inpatient  Remains inpatient appropriate because:Inpatient level of care appropriate due to severity of illness   Dispo: The patient is from: Home              Anticipated d/c is to: SNF              Patient currently is not medically stable to  d/c.  Declined by insurance for CIR, awaiting discharge appeal   Difficult to place patient No      Time Spent in minutes 35 minutes  Procedures:  Right upper extremity AV fistula placement  Consultants:   Nephrology Vascular surgery  Antimicrobials:   Anti-infectives (From admission, onward)   Start     Dose/Rate Route Frequency Ordered Stop   10/02/20 1049  ceFAZolin (ANCEF) 2-4 GM/100ML-% IVPB       Note to Pharmacy: Claybon Jabs   : cabinet override      10/02/20 1049 10/02/20 2259   09/29/20 1330  ceFAZolin (ANCEF) 2-4 GM/100ML-% IVPB       Note to Pharmacy: Cecile Sheerer   : cabinet  override      09/29/20 1330 09/30/20 0144   09/29/20 0000  ceFAZolin (ANCEF) 1 g in sodium chloride 0.9 % 100 mL IVPB        1 g 200 mL/hr over 30 Minutes Intravenous To Radiology 09/27/20 1457 09/29/20 1419          Medications  Scheduled Meds: . amLODipine  10 mg Oral Daily  . carvedilol  12.5 mg Oral BID WC  . Chlorhexidine Gluconate Cloth  6 each Topical Q0600  . Chlorhexidine Gluconate Cloth  6 each Topical Q0600  . darbepoetin (ARANESP) injection - DIALYSIS  200 mcg Intravenous Q Mon-HD  . diclofenac Sodium  2 g Topical QID  . famotidine  20 mg Oral Daily  . gabapentin  100 mg Oral QHS  . heparin sodium (porcine)      . hydrALAZINE  50 mg Oral Q8H  . insulin aspart  0-5 Units Subcutaneous QHS  . insulin aspart  0-9 Units Subcutaneous TID WC  . insulin glargine  15 Units Subcutaneous QHS  . melatonin  3 mg Oral QHS  . metoCLOPramide  5 mg Oral BID  . polyethylene glycol  17 g Oral Daily  . rosuvastatin  10 mg Oral Daily  . senna-docusate  1 tablet Oral BID  . sodium chloride flush  3 mL Intravenous Q12H  . tiZANidine  2 mg Oral TID   Continuous Infusions: . sodium chloride Stopped (09/28/20 1354)   PRN Meds:.sodium chloride, acetaminophen **OR** acetaminophen, diphenhydrAMINE, HYDROmorphone (DILAUDID) injection, labetalol, methocarbamol, metoCLOPramide (REGLAN) injection, oxyCODONE-acetaminophen, polyethylene glycol      Subjective:   Hannah [name] was seen and examined today.  Very deconditioned otherwise no acute issues this morning.  Patient denies dizziness, chest pain, shortness of breath, abdominal pain, N/V/D/C.  No fevers  Objective:   Vitals:   10/15/20 1500 10/15/20 1530 10/15/20 1600 10/15/20 1630  BP: (!) 174/68 (!) 144/64 (!) 148/71 (!) 142/64  Pulse:      Resp:      Temp:      TempSrc:      SpO2:      Weight:      Height:        Intake/Output Summary (Last 24 hours) at 10/15/2020 1640 Last data filed at 10/15/2020 0749 Gross per 24 hour   Intake 240 ml  Output 530 ml  Net -290 ml     Wt Readings from Last 3 Encounters:  10/15/20 57.4 kg  09/17/20 67.8 kg  06/23/20 66.5 kg     Exam  General: Alert and oriented x 3, NAD  Cardiovascular: S1 S2 auscultated, RRR  Respiratory: Diminished breath sound at the bases.   Gastrointestinal: Soft, nontender, nondistended, + bowel sounds  Ext: no pedal edema bilaterally  Neuro:  no new deficits  Musculoskeletal: No digital cyanosis, clubbing  Skin: No rashes  Psych: Normal affect and demeanor, alert and oriented x3    Data Reviewed:  I have personally reviewed following labs and imaging studies  Micro Results No results found for this or any previous visit (from the past 240 hour(s)).  Radiology Reports DG Chest 2 View  Result Date: 09/26/2020 CLINICAL DATA:  Shortness of breath EXAM: CHEST - 2 VIEW COMPARISON:  None. FINDINGS: There is ill-defined airspace opacity in portions of the right middle and lower lobes. There is slight left base atelectasis. Lungs elsewhere clear. Heart is upper normal in size with pulmonary vascularity normal. No adenopathy. No bone lesions. IMPRESSION: Ill-defined opacity consistent with pneumonia in portions of the right middle and lower lobes. Mild left base atelectasis. Lungs otherwise clear. Heart upper normal in size. Electronically Signed   By: Lowella Grip III M.D.   On: 09/26/2020 15:06   CT HEAD WO CONTRAST  Result Date: 10/03/2020 CLINICAL DATA:  Headache.  History multiple sclerosis EXAM: CT HEAD WITHOUT CONTRAST TECHNIQUE: Contiguous axial images were obtained from the base of the skull through the vertex without intravenous contrast. COMPARISON:  CT head 06/09/2020.  MRI head 06/09/2020 FINDINGS: Brain: Moderate white matter changes bilaterally similar to prior MRI. Focal hypodensity in the posterior limb internal capsule on the left likely an area of chronic infarction. Mild hypodensity in the pons centrally unchanged  from the prior study Ventricle size normal.  No acute infarct, hemorrhage, mass Vascular: Negative for hyperdense vessel Skull: Negative Sinuses/Orbits: Mild mucosal edema paranasal sinuses involving the sphenoid sinus. Bilateral cataract extraction Other: None IMPRESSION: Moderate to extensive white matter hypodensity bilaterally. This is similar to prior CT and MRI. This may represent demyelinating disease given history of multiple sclerosis. Chronic ischemic changes could have a similar appearance. No acute abnormality. Electronically Signed   By: Franchot Gallo M.D.   On: 10/03/2020 20:07   CT LUMBAR SPINE WO CONTRAST  Result Date: 10/06/2020 CLINICAL DATA:  Low back pain and right-sided abdominal pain. EXAM: CT LUMBAR SPINE WITHOUT CONTRAST TECHNIQUE: Multidetector CT imaging of the lumbar spine was performed without intravenous contrast administration. Multiplanar CT image reconstructions were also generated. COMPARISON:  None. FINDINGS: Segmentation: 5 lumbar type vertebral bodies. Alignment: Normal Vertebrae: No fracture or focal lesion at L4 or above. At L5-S1, there are some irregularities of the endplates, more affecting the inferior endplate of L5 than the superior endplate of S1. These are favored to represent degenerative change. However, based on the findings associated with the right ileo psoas musculature, I cannot completely rule out the possibility of discitis at this level. See below. Paraspinal and other soft tissues: Marked enlargement of the right psoas muscle with areas of increased and decreased density. This involves the right iliacus muscle to a lesser degree. The appearance seems more consistent with spontaneous ileo psoas hemorrhage than infection, but I can not completely rule out that possibility. There is not appear to be any more generalized retroperitoneal bleeding or definite inflammatory change at the paravertebral L5-S1 level. There is aortic atherosclerotic calcification.  There is a low-density area in the upper pole of the right kidney which is not primarily or completely evaluated but assumed to represent a cyst. 2.7 cm cyst with seen in this region by recent ultrasound. Disc levels: No significant disc space pathology at T12-L1 or L1-2. L2-3: Chronic left-sided disc protrusion with annular calcification. Mild narrowing of the left lateral recess. L3-4: Unremarkable interspace.  L4-5: Mild bulging of the disc.  No compressive stenosis. L5-S1: Endplate irregularities and bulging of the disc as described above. Findings are favored to represent degenerative change rather than infectious discitis, though I can not completely exclude the latter. IMPRESSION: Enlargement of the right psoas muscle and to a lesser extent the upper portion of the right iliacus muscle with areas of low and high density most consistent with spontaneous intramuscular hematoma. I can not completely rule out the possibility of infection but do not favor that. Some irregularity of the endplates at 075-GRM, favored to be degenerative. Early discitis is not excluded but not favored because of the reasons enumerated above. Mild degenerative changes in the lumbar spine as above, without advanced finding or definite neural compressive stenosis. These results will be called to the ordering clinician or representative by the Radiologist Assistant, and communication documented in the PACS or [employer]. Electronically Signed   By: Nelson Chimes M.D.   On: 10/06/2020 11:46   CT PELVIS WO CONTRAST  Result Date: 10/06/2020 CLINICAL DATA:  RLQ abdomen pain EXAM: CT PELVIS WITHOUT CONTRAST TECHNIQUE: Multidetector CT imaging of the pelvis was performed following the standard protocol without intravenous contrast. COMPARISON:  Same day CT lumbar spine. FINDINGS: Urinary Tract:  The bladder is moderately distended. Bowel:  Unremarkable visualized pelvic bowel loops. Vascular/Lymphatic: There is aortoiliac atherosclerotic  disease. No pathologically enlarged lymph nodes. Reproductive:  Unremarkable. Other: There is heterogeneous collection with enlargement of the right psoas muscle, measuring approximately 12.5 x 5.2 x 5.0 cm, and to much lesser extent the upper portion of the right iliacus muscle. Musculoskeletal: No evidence of acute fracture. Symmetric bilateral SI joint degenerative change. There is no osseous destruction is/erosion. There is mild bilateral hip degenerative change. Mild endplate irregularity at L5-S1, see separately dictated lumbar spine CT. There are multiple soft tissue densities along the lower anterior abdomen, likely from medication injection. IMPRESSION: Right psoas muscle enlargement and due to an intramuscular heterogeneous collection measuring approximately 12.5 x 5.2 x 5.0 cm, most compatible with intramuscular hematoma. Infection is possible. Correlate with laboratory findings. No evidence of acute fracture. Symmetric degenerative changes involving the SI joints. Endplate irregularity at L5-S1 which is favored to be chronic. Given the iliopsoas findings, discitis-osteomyelitis or right SI joint infection cannot be completely excluded on noncontrast CT, but felt to be less likely. MRI would be useful for further evaluation. As noted on separately dictated CT of the lumbar spine, these results will be called to the ordering clinician or representative by the Radiologist Assistant, and communication documented in the PACS or [employer]. Electronically Signed   By: Maurine Simmering   On: 10/06/2020 12:12   IR Fluoro Guide CV Line Right  Result Date: 09/29/2020 INDICATION: 55 year old with chronic kidney disease and needs a hemodialysis catheter. EXAM: FLUOROSCOPIC AND ULTRASOUND GUIDED PLACEMENT OF A TUNNELED DIALYSIS CATHETER Physician: Stephan Minister. Anselm Pancoast, MD MEDICATIONS: Ancef 1 g; The antibiotic was administered within an appropriate time interval prior to skin puncture. ANESTHESIA/SEDATION: Versed 1.0 mg  IV; Fentanyl 25 mcg IV; Moderate Sedation Time:  18 minutes The patient was continuously monitored during the procedure by the interventional radiology nurse under my direct supervision. FLUOROSCOPY TIME:  12 seconds, 1 mGy COMPLICATIONS: None immediate. PROCEDURE: Informed consent was obtained for placement of a tunneled dialysis catheter. The patient was placed supine on the interventional table. Ultrasound confirmed a patent right internal jugular vein. Ultrasound images were obtained for documentation. The right neck and chest was prepped and  draped in a sterile fashion. Maximal barrier sterile technique was utilized including caps, mask, sterile gowns, sterile gloves, sterile drape, hand hygiene and skin antiseptic. The right neck was anesthetized with 1% lidocaine. A small incision was made with #11 blade scalpel. A 21 gauge needle directed into the right internal jugular vein with ultrasound guidance. A micropuncture dilator set was placed. A 19 cm tip to cuff Palindrome catheter was selected. The skin below the right clavicle was anesthetized and a small incision was made with an #11 blade scalpel. A subcutaneous tunnel was formed to the vein dermatotomy site. The catheter was brought through the tunnel. The vein dermatotomy site was dilated to accommodate a peel-away sheath. The catheter was placed through the peel-away sheath and directed into the central venous structures. The tip of the catheter was placed at the superior cavoatrial junction with fluoroscopy. Fluoroscopic images were obtained for documentation. Both lumens were found to aspirate and flush well. The proper amount of heparin was flushed in both lumens. The vein dermatotomy site was closed using a single layer of absorbable suture and Dermabond. Surgifoam was placed in subcutaneous tract. The catheter was secured to the skin using Prolene suture. IMPRESSION: Successful placement of a right jugular tunneled dialysis catheter using ultrasound  and fluoroscopic guidance. Electronically Signed   By: Markus Daft M.D.   On: 09/29/2020 17:35   IR US Guide Vasc Access Right  Result Date: 09/29/2020 INDICATION: 55 year old with chronic kidney disease and needs a hemodialysis catheter. EXAM: FLUOROSCOPIC AND ULTRASOUND GUIDED PLACEMENT OF A TUNNELED DIALYSIS CATHETER Physician: Stephan Minister. Anselm Pancoast, MD MEDICATIONS: Ancef 1 g; The antibiotic was administered within an appropriate time interval prior to skin puncture. ANESTHESIA/SEDATION: Versed 1.0 mg IV; Fentanyl 25 mcg IV; Moderate Sedation Time:  18 minutes The patient was continuously monitored during the procedure by the interventional radiology nurse under my direct supervision. FLUOROSCOPY TIME:  12 seconds, 1 mGy COMPLICATIONS: None immediate. PROCEDURE: Informed consent was obtained for placement of a tunneled dialysis catheter. The patient was placed supine on the interventional table. Ultrasound confirmed a patent right internal jugular vein. Ultrasound images were obtained for documentation. The right neck and chest was prepped and draped in a sterile fashion. Maximal barrier sterile technique was utilized including caps, mask, sterile gowns, sterile gloves, sterile drape, hand hygiene and skin antiseptic. The right neck was anesthetized with 1% lidocaine. A small incision was made with #11 blade scalpel. A 21 gauge needle directed into the right internal jugular vein with ultrasound guidance. A micropuncture dilator set was placed. A 19 cm tip to cuff Palindrome catheter was selected. The skin below the right clavicle was anesthetized and a small incision was made with an #11 blade scalpel. A subcutaneous tunnel was formed to the vein dermatotomy site. The catheter was brought through the tunnel. The vein dermatotomy site was dilated to accommodate a peel-away sheath. The catheter was placed through the peel-away sheath and directed into the central venous structures. The tip of the catheter was placed at  the superior cavoatrial junction with fluoroscopy. Fluoroscopic images were obtained for documentation. Both lumens were found to aspirate and flush well. The proper amount of heparin was flushed in both lumens. The vein dermatotomy site was closed using a single layer of absorbable suture and Dermabond. Surgifoam was placed in subcutaneous tract. The catheter was secured to the skin using Prolene suture. IMPRESSION: Successful placement of a right jugular tunneled dialysis catheter using ultrasound and fluoroscopic guidance. Electronically Signed   By:  Markus Daft M.D.   On: 09/29/2020 17:35   DG Chest Port 1 View  Result Date: 10/03/2020 CLINICAL DATA:  Shortness of breath EXAM: PORTABLE CHEST 1 VIEW COMPARISON:  09/26/2020 FINDINGS: Cardiac shadow is enlarged but stable. Dialysis catheter is now seen in satisfactory position on the right. Lungs are well aerated bilaterally. Resolution of previously seen basilar opacities. No new focal abnormality is noted. IMPRESSION: No acute abnormality seen. Electronically Signed   By: Inez Catalina M.D.   On: 10/03/2020 14:59   ECHOCARDIOGRAM COMPLETE  Result Date: 09/27/2020    ECHOCARDIOGRAM REPORT   Patient Name:   JAZALYNN WOMELSDORF Date of Exam: 09/27/2020 Medical Rec #:  [id number]    Height:       62.0 in Accession #:    [id number]   Weight:       140.0 lb Date of Birth:  April 30, 1966   BSA:          1.643 m Patient Age:    36 years     BP:           143/62 mmHg Patient Gender: F            HR:           86 bpm. Exam Location:  Inpatient Procedure: 2D Echo, Cardiac Doppler and Color Doppler Indications:    Pericardial Effusion  History:        Patient has prior history of Echocardiogram examinations, most                 recent 06/11/2020.  Sonographer:    Merrie Roof RDCS Referring Phys: [place]  1. Left ventricular ejection fraction, by estimation, is 70 to 75%. The left ventricle has hyperdynamic function. The left ventricle has no regional  wall motion abnormalities. Left ventricular diastolic function could not be evaluated.  2. Right ventricular systolic function is normal. The right ventricular size is normal. There is mildly elevated pulmonary artery systolic pressure. The estimated right ventricular systolic pressure is 123456 mmHg.  3. A small pericardial effusion is present. The pericardial effusion is circumferential. There is no evidence of cardiac tamponade.  4. The mitral valve is normal in structure. No evidence of mitral valve regurgitation. No evidence of mitral stenosis.  5. The aortic valve is tricuspid. Aortic valve regurgitation is not visualized. Mild aortic valve sclerosis is present, with no evidence of aortic valve stenosis.  6. The inferior vena cava is normal in size with greater than 50% respiratory variability, suggesting right atrial pressure of 3 mmHg. FINDINGS  Left Ventricle: Left ventricular ejection fraction, by estimation, is 70 to 75%. The left ventricle has hyperdynamic function. The left ventricle has no regional wall motion abnormalities. The left ventricular internal cavity size was normal in size. There is no left ventricular hypertrophy. Left ventricular diastolic function could not be evaluated. Right Ventricle: The right ventricular size is normal. No increase in right ventricular wall thickness. Right ventricular systolic function is normal. There is mildly elevated pulmonary artery systolic pressure. The tricuspid regurgitant velocity is 2.94  m/s, and with an assumed right atrial pressure of 3 mmHg, the estimated right ventricular systolic pressure is 123456 mmHg. Left Atrium: Left atrial size was normal in size. Right Atrium: Right atrial size was normal in size. Pericardium: A small pericardial effusion is present. The pericardial effusion is circumferential. There is no evidence of cardiac tamponade. Mitral Valve: The mitral valve is normal in structure. No evidence of mitral  valve regurgitation. No evidence  of mitral valve stenosis. Tricuspid Valve: The tricuspid valve is normal in structure. Tricuspid valve regurgitation is trivial. No evidence of tricuspid stenosis. Aortic Valve: The aortic valve is tricuspid. Aortic valve regurgitation is not visualized. Mild aortic valve sclerosis is present, with no evidence of aortic valve stenosis. Aortic valve mean gradient measures 8.0 mmHg. Aortic valve peak gradient measures 13.8 mmHg. Aortic valve area, by VTI measures 1.96 cm. Pulmonic Valve: The pulmonic valve was normal in structure. Pulmonic valve regurgitation is not visualized. No evidence of pulmonic stenosis. Aorta: The aortic root is normal in size and structure. Venous: The inferior vena cava is normal in size with greater than 50% respiratory variability, suggesting right atrial pressure of 3 mmHg. IAS/Shunts: No atrial level shunt detected by color flow Doppler.  LEFT VENTRICLE PLAX 2D LVIDd:         4.10 cm  Diastology LVIDs:         2.30 cm  LV e' medial:  6.96 cm/s LV PW:         1.30 cm  LV e' lateral: 8.49 cm/s LV IVS:        1.00 cm LVOT diam:     1.90 cm LV SV:         66 LV SV Index:   40 LVOT Area:     2.84 cm  RIGHT VENTRICLE          IVC RV Basal diam:  3.10 cm  IVC diam: 1.20 cm LEFT ATRIUM             Index       RIGHT ATRIUM           Index LA diam:        3.50 cm 2.13 cm/m  RA Area:     15.10 cm LA Vol (A2C):   63.2 ml 38.47 ml/m RA Volume:   37.60 ml  22.89 ml/m LA Vol (A4C):   50.5 ml 30.74 ml/m LA Biplane Vol: 57.1 ml 34.76 ml/m  AORTIC VALVE AV Area (Vmax):    2.06 cm AV Area (Vmean):   1.99 cm AV Area (VTI):     1.96 cm AV Vmax:           186.00 cm/s AV Vmean:          128.000 cm/s AV VTI:            0.338 m AV Peak Grad:      13.8 mmHg AV Mean Grad:      8.0 mmHg LVOT Vmax:         135.00 cm/s LVOT Vmean:        90.000 cm/s LVOT VTI:          0.233 m LVOT/AV VTI ratio: 0.69  AORTA Ao Root diam: 3.00 cm Ao Asc diam:  2.55 cm TRICUSPID VALVE TR Peak grad:   34.6 mmHg TR Vmax:         294.00 cm/s  SHUNTS Systemic VTI:  0.23 m Systemic Diam: 1.90 cm Fransico Him MD Electronically signed by Fransico Him MD Signature Date/Time: 09/27/2020/6:32:09 PM    Final    DG HIP UNILAT WITH PELVIS 1V RIGHT  Result Date: 10/03/2020 CLINICAL DATA:  Right hip pain. EXAM: DG HIP (WITH OR WITHOUT PELVIS) 1V RIGHT COMPARISON:  None. FINDINGS: There is no evidence of hip fracture or dislocation. There is no evidence of arthropathy or other focal bone abnormality. IMPRESSION: Negative. Electronically Signed  By: Marijo Conception M.D.   On: 10/03/2020 21:09   VAS [place] LOWER EXTREMITY VENOUS (DVT) (ONLY [facility] & [facility] 7a-7p)  Result Date: 09/26/2020  Lower Venous DVT Study Patient Name:  DAKOTAH WEEDA  Date of Exam:   09/26/2020 Medical Rec #: [id number]     Accession #:    [id number] Date of Birth: [birth year]    Patient Gender: F Patient Age:   54Y Exam Location:  [facility] Procedure:      VAS [place] LOWER EXTREMITY VENOUS (DVT) Referring Phys: [id number] LAURA A MURPHY --------------------------------------------------------------------------------  Indications: Edema.  Comparison Study: No previous exams Performing Technologist: Rogelia Rohrer  Examination Guidelines: A complete evaluation includes B-mode imaging, spectral Doppler, color Doppler, and power Doppler as needed of all accessible portions of each vessel. Bilateral testing is considered an integral part of a complete examination. Limited examinations for reoccurring indications may be performed as noted. The reflux portion of the exam is performed with the patient in reverse Trendelenburg.  +---------+---------------+---------+-----------+----------+--------------+ RIGHT    CompressibilityPhasicitySpontaneityPropertiesThrombus Aging +---------+---------------+---------+-----------+----------+--------------+ CFV      Full           Yes      Yes                                  +---------+---------------+---------+-----------+----------+--------------+ SFJ      Full                                                        +---------+---------------+---------+-----------+----------+--------------+ FV Prox  Full           Yes      Yes                                 +---------+---------------+---------+-----------+----------+--------------+ FV Mid   Full           Yes      Yes                                 +---------+---------------+---------+-----------+----------+--------------+ FV DistalFull           Yes      Yes                                 +---------+---------------+---------+-----------+----------+--------------+ PFV      Full                                                        +---------+---------------+---------+-----------+----------+--------------+ POP      Full           Yes      Yes                                 +---------+---------------+---------+-----------+----------+--------------+ PTV      Full                                                        +---------+---------------+---------+-----------+----------+--------------+  PERO     Full                                                        +---------+---------------+---------+-----------+----------+--------------+   +----+---------------+---------+-----------+----------+--------------+ LEFTCompressibilityPhasicitySpontaneityPropertiesThrombus Aging +----+---------------+---------+-----------+----------+--------------+ CFV Full           Yes      Yes                                 +----+---------------+---------+-----------+----------+--------------+     Summary: RIGHT: - There is no evidence of deep vein thrombosis in the lower extremity. - There is no evidence of superficial venous thrombosis.  - No cystic structure found in the popliteal fossa. Subcutaneous edema from mid thigh to ankle  LEFT: - No evidence of common femoral vein obstruction.   *See table(s) above for measurements and observations. Electronically signed by Servando Snare MD on 09/26/2020 at 4:55:05 PM.    Final    VAS [place] UPPER EXT VEIN MAPPING (PRE-OP AVF)  Result Date: 09/29/2020 [place] Patient Name:  GRACELYNNE KUCHAR  Date of Exam:   09/29/2020 Medical Rec #: [id number]     Accession #:    [id number] Date of Birth: Apr 04, 1966    Patient Gender: F Patient Age:   12Y Exam Location:  [facility] Procedure:      VAS [place] UPPER EXT VEIN MAPPING (PRE-OP AVF) Referring Phys: [id number] KELLIE GOLDSBOROUGH --------------------------------------------------------------------------------  Indications: Pre-access. Comparison Study: no prior Performing Technologist: Abram Sander RVS  Examination Guidelines: A complete evaluation includes B-mode imaging, spectral Doppler, color Doppler, and power Doppler as needed of all accessible portions of each vessel. Bilateral testing is considered an integral part of a complete examination. Limited examinations for reoccurring indications may be performed as noted. +-----------------+-------------+----------+--------+ Right Cephalic   Diameter (cm)Depth (cm)Findings +-----------------+-------------+----------+--------+ Shoulder             0.22        1.35            +-----------------+-------------+----------+--------+ Prox upper arm       0.20        1.28            +-----------------+-------------+----------+--------+ Mid upper arm        0.21        1.37            +-----------------+-------------+----------+--------+ Dist upper arm       0.10        0.47            +-----------------+-------------+----------+--------+ Antecubital fossa    0.16        0.25            +-----------------+-------------+----------+--------+ Prox forearm         0.21        0.82            +-----------------+-------------+----------+--------+ Mid forearm          0.22        0.76             +-----------------+-------------+----------+--------+ Dist forearm         0.22        0.38            +-----------------+-------------+----------+--------+ Wrist  0.23        0.32            +-----------------+-------------+----------+--------+ +-----------------+-------------+----------+--------+ Right Basilic    Diameter (cm)Depth (cm)Findings +-----------------+-------------+----------+--------+ Prox upper arm       0.35        0.99            +-----------------+-------------+----------+--------+ Mid upper arm        0.27        1.19            +-----------------+-------------+----------+--------+ Dist upper arm       0.29        1.26            +-----------------+-------------+----------+--------+ Antecubital fossa    0.21        1.03            +-----------------+-------------+----------+--------+ Prox forearm         0.21        0.76            +-----------------+-------------+----------+--------+ Mid forearm          0.20        0.46            +-----------------+-------------+----------+--------+ Distal forearm       0.21        0.36            +-----------------+-------------+----------+--------+ Wrist                0.18        0.39            +-----------------+-------------+----------+--------+ +-----------------+-------------+----------+---------+ Left Cephalic    Diameter (cm)Depth (cm)Findings  +-----------------+-------------+----------+---------+ Shoulder             0.31        1.02             +-----------------+-------------+----------+---------+ Prox upper arm       0.24        1.07             +-----------------+-------------+----------+---------+ Mid upper arm        0.13        1.23   branching +-----------------+-------------+----------+---------+ Dist upper arm       0.13        1.05             +-----------------+-------------+----------+---------+ Antecubital fossa    0.20        2.97              +-----------------+-------------+----------+---------+ Prox forearm         0.18        0.65             +-----------------+-------------+----------+---------+ Mid forearm          0.24        0.52             +-----------------+-------------+----------+---------+ Dist forearm         0.22        0.34             +-----------------+-------------+----------+---------+ +-----------------+-------------+----------+---------+ Left Basilic     Diameter (cm)Depth (cm)Findings  +-----------------+-------------+----------+---------+ Prox upper arm       0.38        1.48             +-----------------+-------------+----------+---------+ Mid upper arm        0.25        1.42             +-----------------+-------------+----------+---------+  Dist upper arm       0.27        1.18             +-----------------+-------------+----------+---------+ Antecubital fossa    0.26        0.68             +-----------------+-------------+----------+---------+ Prox forearm         0.22        0.30             +-----------------+-------------+----------+---------+ Mid forearm          0.18        0.23             +-----------------+-------------+----------+---------+ Distal forearm       0.19        0.28             +-----------------+-------------+----------+---------+ Elbow                                   branching +-----------------+-------------+----------+---------+ *See table(s) above for measurements and observations.  Diagnosing physician: Monica Martinez MD Electronically signed by Monica Martinez MD on 09/29/2020 at 4:45:25 PM.    Final     Lab Data:  CBC: Recent Labs  Lab 10/09/20 0233 10/10/20 0038 10/11/20 0111 10/15/20 1500  WBC 8.9 7.7 6.9 8.6  HGB 7.9* 8.0* 8.2* 9.8*  HCT 24.6* 24.6* 25.8* 31.7*  MCV 92.5 91.1 91.8 94.9  PLT 199 218 265 [id number]*   Basic Metabolic Panel: Recent Labs  Lab 10/11/20 0111 10/12/20 0419 10/13/20 0104  10/14/20 0147 10/15/20 0239  NA 130* 128* 128* 131* 128*  K 3.3* 3.7 4.0 3.9 3.7  CL 95* 94* 94* 97* 92*  CO2 '28 27 24 28 27  '$ GLUCOSE 196* 147* 200* 177* 134*  BUN 8 14 27* 12 19  CREATININE 2.85* 4.76* 6.54* 3.36* 4.81*  CALCIUM 8.1* 8.4* 8.2* 8.2* 9.0  PHOS 2.4* 4.2 5.2* 3.1 3.9   GFR: Estimated Creatinine Clearance: 10.3 mL/min (A) (by C-G formula based on SCr of 4.81 mg/dL (H)). Liver Function Tests: Recent Labs  Lab 10/11/20 0111 10/12/20 0419 10/13/20 0104 10/14/20 0147 10/15/20 0239  ALBUMIN 2.3* 2.3* 2.2* 2.2* 2.4*   No results for input(s): LIPASE, AMYLASE in the last 168 hours. No results for input(s): AMMONIA in the last 168 hours. Coagulation Profile: No results for input(s): INR, PROTIME in the last 168 hours. Cardiac Enzymes: No results for input(s): CKTOTAL, CKMB, CKMBINDEX, TROPONINI in the last 168 hours. BNP (last 3 results) No results for input(s): PROBNP in the last 8760 hours. HbA1C: No results for input(s): HGBA1C in the last 72 hours. CBG: Recent Labs  Lab 10/14/20 1112 10/14/20 1625 10/14/20 2006 10/15/20 0635 10/15/20 1132  GLUCAP 188* 171* 175* 117* 140*   Lipid Profile: No results for input(s): CHOL, HDL, LDLCALC, TRIG, CHOLHDL, LDLDIRECT in the last 72 hours. Thyroid Function Tests: No results for input(s): TSH, T4TOTAL, FREET4, T3FREE, THYROIDAB in the last 72 hours. Anemia Panel: No results for input(s): VITAMINB12, FOLATE, FERRITIN, TIBC, IRON, RETICCTPCT in the last 72 hours. Urine analysis:    Component Value Date/Time   COLORURINE STRAW (A) 09/12/2020 2208   APPEARANCEUR CLEAR 09/12/2020 2208   LABSPEC 1.007 09/12/2020 2208   PHURINE 6.0 09/12/2020 2208   GLUCOSEU >=500 (A) 09/12/2020 2208   HGBUR NEGATIVE 09/12/2020 [place] 09/12/2020 2208   Benjamin Stain  NEGATIVE 09/12/2020 2208   PROTEINUR >=300 (A) 09/12/2020 2208   NITRITE NEGATIVE 09/12/2020 2208   LEUKOCYTESUR NEGATIVE 09/12/2020 2208      Antar Milks M.D. [facility] 10/15/2020, 4:40 PM  Available via Epic secure chat 7am-7pm After 7 pm, please refer to night coverage provider listed on amion.

## 2020-10-15 NOTE — Care Management Important Message (Signed)
Important Message  Patient Details  Name: Hannah [name] MRN: [id number] Date of Birth: 07-19-1965   Medicare Important Message Given:  Yes     Barb Merino Niko Penson 10/15/2020, 12:58 PM

## 2020-10-15 NOTE — Progress Notes (Signed)
Inpatient Rehab Admissions Coordinator:   Notified by insurance that request for CIR has been declined with option to do expedited appeal which I have begun.  Will take ~72 hours to hear back determination from the plan.   Shann Medal, PT, DPT Admissions Coordinator [phone] 10/15/20  12:42 PM

## 2020-10-16 DIAGNOSIS — N185 Chronic kidney disease, stage 5: Secondary | ICD-10-CM | POA: Diagnosis not present

## 2020-10-16 DIAGNOSIS — E8770 Fluid overload, unspecified: Secondary | ICD-10-CM | POA: Diagnosis not present

## 2020-10-16 DIAGNOSIS — N186 End stage renal disease: Secondary | ICD-10-CM | POA: Diagnosis not present

## 2020-10-16 DIAGNOSIS — N179 Acute kidney failure, unspecified: Secondary | ICD-10-CM | POA: Diagnosis not present

## 2020-10-16 LAB — RENAL FUNCTION PANEL
Albumin: 2.7 g/dL — ABNORMAL LOW (ref 3.5–5.0)
Anion gap: 8 (ref 5–15)
BUN: 8 mg/dL (ref 6–20)
CO2: 30 mmol/L (ref 22–32)
Calcium: 8.7 mg/dL — ABNORMAL LOW (ref 8.9–10.3)
Chloride: 94 mmol/L — ABNORMAL LOW (ref 98–111)
Creatinine, Ser: 3.23 mg/dL — ABNORMAL HIGH (ref 0.44–1.00)
GFR, Estimated: 16 mL/min — ABNORMAL LOW (ref >60)
Glucose, Bld: 90 mg/dL (ref 70–99)
Phosphorus: 3.7 mg/dL (ref 2.5–4.6)
Potassium: 3.5 mmol/L (ref 3.5–5.1)
Sodium: 132 mmol/L — ABNORMAL LOW (ref 135–145)

## 2020-10-16 LAB — GLUCOSE, CAPILLARY
Glucose-Capillary: 79 mg/dL (ref 70–99)
Glucose-Capillary: 180 mg/dL — ABNORMAL HIGH (ref 70–99)
Glucose-Capillary: 143 mg/dL — ABNORMAL HIGH (ref 70–99)
Glucose-Capillary: 264 mg/dL — ABNORMAL HIGH (ref 70–99)
Glucose-Capillary: 173 mg/dL — ABNORMAL HIGH (ref 70–99)

## 2020-10-16 NOTE — Progress Notes (Signed)
[facility]                                                                              Patient Demographics  Hannah [name], is a 55 y.o. female, DOB - 1966-03-27, [id number]  Admit date - 09/26/2020   Admitting Physician Bonnell Public, MD  Outpatient Primary MD for the patient is Vernie Shanks, MD  Outpatient specialists:   LOS - 19  days   Medical records reviewed and are as summarized below:    Chief Complaint  Patient presents with  . Leg Swelling       Brief summary   Patient is a 55 year old female with CKD stage V, anemia, CVA, hypertension, diabetes, hyperlipidemia, MS presented with shortness of breath, lower extremity swelling and concerns for possible uremia. Nephrology consulted, was deemed to have progressed to ESRD.  Patient started on hemodialysis.  Hospitalization complicated by deconditioning and retroperitoneal bleed. Has been declined for CIR, now discharged appealed.  Assessment & Plan    Principal Problem: New ESRD on hemodialysis, presented with volume overload, metabolic acidosis, uremia -Nephrology consulted, AV fistula created on 10/02/2020 by Dr. Donzetta Matters -Started on hemodialysis with [facility], MWF while inpatient  -Patient has been accepted to [facility] outpatient HD clinic on TTS schedule with chair time of 11:45 AM and arrive to her appointment is at 11:25 AM -Awaiting appeal decision for admission to CIR, currently no acute issues  Active Problems: Right flank pain/right groin pain, psoas enlargement with right psoas hematoma -CT pelvis, lumbar spine showed right psoas enlargement most compatible with intramuscular hematoma, infection possible -Continue conservative management per vascular surgery, Dr. Carlis Abbott. -Dr. Marthenia Rolling discussed with Dr. Donne Hazel on 5/25, recommended to hold Plavix for 2 weeks, no surgery needed and continue pain management  Essential hypertension -BP stable  Diabetes mellitus type 2, uncontrolled  with CKD progressed to  ESRD, on long-term insulin -Hemoglobin A1c 8.7 on 09/13/2020 -CBGs currently controlled, monitor closely as started on HD -Continue Lantus 15 units qhs, sliding scale insulin.   Recent Labs    10/15/20 1132 10/15/20 1815 10/15/20 2141 10/16/20 0636 10/16/20 0751 10/16/20 1138  GLUCAP 140* 159* 296* 79 180* 143*    History of recent CVA in 05/2020 with residual right-sided weakness Hyperlipidemia -Plavix currently on hold, continue Crestor -CT head showed moderate extensive white matter hypodensities bilaterally similar to prior CT.  MRI may represent demyelinating disease with history of MS. -Outpatient follow-up with neurology -Awaiting CIR  AB LA with anemia of chronic disease/ESRD -Patient was transfused 1 unit packed RBCs on 5/22, started on ESA -H&H stable  Left abdominal wall hematoma -At heparin injection site, monitor off heparin  History of chronic multiple sclerosis -Outpatient follow-up with neurology/ Dr Leonie Man.  No acute issues  Code Status: Full CODE STATUS DVT Prophylaxis:  SCDs   Level of Care: Level of care: Telemetry Medical Family Communication: Discussed all imaging results, lab results, explained to the patient   Disposition Plan:     Status is: Inpatient  Remains inpatient appropriate because:Inpatient level of care appropriate due to severity of illness   Dispo: The patient is from: Home  Anticipated d/c is to: SNF              Patient currently is not medically stable to d/c.  Declined by insurance for CIR, awaiting discharge appeal   Difficult to place patient No      Time Spent in minutes 15 minutes  Procedures:  Right upper extremity AV fistula placement  Consultants:   Nephrology Vascular surgery  Antimicrobials:   Anti-infectives (From admission, onward)   Start     Dose/Rate Route Frequency Ordered Stop   10/02/20 1049  ceFAZolin (ANCEF) 2-4 GM/100ML-% IVPB       Note to Pharmacy:  Claybon Jabs   : cabinet override      10/02/20 1049 10/02/20 2259   09/29/20 1330  ceFAZolin (ANCEF) 2-4 GM/100ML-% IVPB       Note to Pharmacy: Cecile Sheerer   : cabinet override      09/29/20 1330 09/30/20 0144   09/29/20 0000  ceFAZolin (ANCEF) 1 g in sodium chloride 0.9 % 100 mL IVPB        1 g 200 mL/hr over 30 Minutes Intravenous To Radiology 09/27/20 1457 09/29/20 1419         Medications  Scheduled Meds: . amLODipine  10 mg Oral Daily  . carvedilol  12.5 mg Oral BID WC  . Chlorhexidine Gluconate Cloth  6 each Topical Q0600  . Chlorhexidine Gluconate Cloth  6 each Topical Q0600  . darbepoetin (ARANESP) injection - DIALYSIS  200 mcg Intravenous Q Mon-HD  . diclofenac Sodium  2 g Topical QID  . famotidine  20 mg Oral Daily  . gabapentin  100 mg Oral QHS  . hydrALAZINE  50 mg Oral Q8H  . insulin aspart  0-5 Units Subcutaneous QHS  . insulin aspart  0-9 Units Subcutaneous TID WC  . insulin glargine  15 Units Subcutaneous QHS  . melatonin  3 mg Oral QHS  . metoCLOPramide  5 mg Oral BID  . polyethylene glycol  17 g Oral Daily  . rosuvastatin  10 mg Oral Daily  . senna-docusate  1 tablet Oral BID  . sodium chloride flush  3 mL Intravenous Q12H  . tiZANidine  2 mg Oral TID   Continuous Infusions: . sodium chloride Stopped (09/28/20 1354)   PRN Meds:.sodium chloride, acetaminophen **OR** acetaminophen, diphenhydrAMINE, HYDROmorphone (DILAUDID) injection, labetalol, methocarbamol, metoCLOPramide (REGLAN) injection, oxyCODONE-acetaminophen, polyethylene glycol      Subjective:   [name] was seen and examined today.  Deconditioned, also complaining of constipation.  No acute issues overnight.  No fevers.  Patient denies dizziness, chest pain, shortness of breath, abdominal pain, N/V/D/C.    Objective:   Vitals:   10/15/20 1818 10/16/20 0500 10/16/20 0500 10/16/20 0751  BP: (!) 165/69  136/78 131/60  Pulse: (!) 104  84 (!) 110  Resp: '18  12 16  '$ Temp: 99.3  F (37.4 C)  98 F (36.7 C) 98.5 F (36.9 C)  TempSrc:    Oral  SpO2: 99%  97% 96%  Weight:  57.2 kg    Height:        Intake/Output Summary (Last 24 hours) at 10/16/2020 1359 Last data filed at 10/15/2020 1740 Gross per 24 hour  Intake --  Output 2849 ml  Net -2849 ml     Wt Readings from Last 3 Encounters:  10/16/20 57.2 kg  09/17/20 67.8 kg  06/23/20 66.5 kg    Physical Exam  General: Alert and oriented x 3, NAD  Cardiovascular: S1 S2  clear, RRR  Respiratory: CTAB, no wheezing, rales or rhonchi, Rt IJ TDC  Gastrointestinal: Soft, nontender, nondistended, NBS  Ext: no pedal edema bilaterally, RUE AV fistula  Neuro: no new deficits  Skin: No rashes  Psych: Normal affect and demeanor, alert and oriented x3     Data Reviewed:  I have personally reviewed following labs and imaging studies  Micro Results No results found for this or any previous visit (from the past 240 hour(s)).  Radiology Reports DG Chest 2 View  Result Date: 09/26/2020 CLINICAL DATA:  Shortness of breath EXAM: CHEST - 2 VIEW COMPARISON:  None. FINDINGS: There is ill-defined airspace opacity in portions of the right middle and lower lobes. There is slight left base atelectasis. Lungs elsewhere clear. Heart is upper normal in size with pulmonary vascularity normal. No adenopathy. No bone lesions. IMPRESSION: Ill-defined opacity consistent with pneumonia in portions of the right middle and lower lobes. Mild left base atelectasis. Lungs otherwise clear. Heart upper normal in size. Electronically Signed   By: Lowella Grip III M.D.   On: 09/26/2020 15:06   CT HEAD WO CONTRAST  Result Date: 10/03/2020 CLINICAL DATA:  Headache.  History multiple sclerosis EXAM: CT HEAD WITHOUT CONTRAST TECHNIQUE: Contiguous axial images were obtained from the base of the skull through the vertex without intravenous contrast. COMPARISON:  CT head 06/09/2020.  MRI head 06/09/2020 FINDINGS: Brain: Moderate white matter  changes bilaterally similar to prior MRI. Focal hypodensity in the posterior limb internal capsule on the left likely an area of chronic infarction. Mild hypodensity in the pons centrally unchanged from the prior study Ventricle size normal.  No acute infarct, hemorrhage, mass Vascular: Negative for hyperdense vessel Skull: Negative Sinuses/Orbits: Mild mucosal edema paranasal sinuses involving the sphenoid sinus. Bilateral cataract extraction Other: None IMPRESSION: Moderate to extensive white matter hypodensity bilaterally. This is similar to prior CT and MRI. This may represent demyelinating disease given history of multiple sclerosis. Chronic ischemic changes could have a similar appearance. No acute abnormality. Electronically Signed   By: Franchot Gallo M.D.   On: 10/03/2020 20:07   CT LUMBAR SPINE WO CONTRAST  Result Date: 10/06/2020 CLINICAL DATA:  Low back pain and right-sided abdominal pain. EXAM: CT LUMBAR SPINE WITHOUT CONTRAST TECHNIQUE: Multidetector CT imaging of the lumbar spine was performed without intravenous contrast administration. Multiplanar CT image reconstructions were also generated. COMPARISON:  None. FINDINGS: Segmentation: 5 lumbar type vertebral bodies. Alignment: Normal Vertebrae: No fracture or focal lesion at L4 or above. At L5-S1, there are some irregularities of the endplates, more affecting the inferior endplate of L5 than the superior endplate of S1. These are favored to represent degenerative change. However, based on the findings associated with the right ileo psoas musculature, I cannot completely rule out the possibility of discitis at this level. See below. Paraspinal and other soft tissues: Marked enlargement of the right psoas muscle with areas of increased and decreased density. This involves the right iliacus muscle to a lesser degree. The appearance seems more consistent with spontaneous ileo psoas hemorrhage than infection, but I can not completely rule out that  possibility. There is not appear to be any more generalized retroperitoneal bleeding or definite inflammatory change at the paravertebral L5-S1 level. There is aortic atherosclerotic calcification. There is a low-density area in the upper pole of the right kidney which is not primarily or completely evaluated but assumed to represent a cyst. 2.7 cm cyst with seen in this region by recent ultrasound. Disc levels: No significant  disc space pathology at T12-L1 or L1-2. L2-3: Chronic left-sided disc protrusion with annular calcification. Mild narrowing of the left lateral recess. L3-4: Unremarkable interspace. L4-5: Mild bulging of the disc.  No compressive stenosis. L5-S1: Endplate irregularities and bulging of the disc as described above. Findings are favored to represent degenerative change rather than infectious discitis, though I can not completely exclude the latter. IMPRESSION: Enlargement of the right psoas muscle and to a lesser extent the upper portion of the right iliacus muscle with areas of low and high density most consistent with spontaneous intramuscular hematoma. I can not completely rule out the possibility of infection but do not favor that. Some irregularity of the endplates at 075-GRM, favored to be degenerative. Early discitis is not excluded but not favored because of the reasons enumerated above. Mild degenerative changes in the lumbar spine as above, without advanced finding or definite neural compressive stenosis. These results will be called to the ordering clinician or representative by the Radiologist Assistant, and communication documented in the PACS or [employer]. Electronically Signed   By: Nelson Chimes M.D.   On: 10/06/2020 11:46   CT PELVIS WO CONTRAST  Result Date: 10/06/2020 CLINICAL DATA:  RLQ abdomen pain EXAM: CT PELVIS WITHOUT CONTRAST TECHNIQUE: Multidetector CT imaging of the pelvis was performed following the standard protocol without intravenous contrast. COMPARISON:   Same day CT lumbar spine. FINDINGS: Urinary Tract:  The bladder is moderately distended. Bowel:  Unremarkable visualized pelvic bowel loops. Vascular/Lymphatic: There is aortoiliac atherosclerotic disease. No pathologically enlarged lymph nodes. Reproductive:  Unremarkable. Other: There is heterogeneous collection with enlargement of the right psoas muscle, measuring approximately 12.5 x 5.2 x 5.0 cm, and to much lesser extent the upper portion of the right iliacus muscle. Musculoskeletal: No evidence of acute fracture. Symmetric bilateral SI joint degenerative change. There is no osseous destruction is/erosion. There is mild bilateral hip degenerative change. Mild endplate irregularity at L5-S1, see separately dictated lumbar spine CT. There are multiple soft tissue densities along the lower anterior abdomen, likely from medication injection. IMPRESSION: Right psoas muscle enlargement and due to an intramuscular heterogeneous collection measuring approximately 12.5 x 5.2 x 5.0 cm, most compatible with intramuscular hematoma. Infection is possible. Correlate with laboratory findings. No evidence of acute fracture. Symmetric degenerative changes involving the SI joints. Endplate irregularity at L5-S1 which is favored to be chronic. Given the iliopsoas findings, discitis-osteomyelitis or right SI joint infection cannot be completely excluded on noncontrast CT, but felt to be less likely. MRI would be useful for further evaluation. As noted on separately dictated CT of the lumbar spine, these results will be called to the ordering clinician or representative by the Radiologist Assistant, and communication documented in the PACS or [employer]. Electronically Signed   By: Maurine Simmering   On: 10/06/2020 12:12   IR Fluoro Guide CV Line Right  Result Date: 09/29/2020 INDICATION: 55 year old with chronic kidney disease and needs a hemodialysis catheter. EXAM: FLUOROSCOPIC AND ULTRASOUND GUIDED PLACEMENT OF A TUNNELED  DIALYSIS CATHETER Physician: Stephan Minister. Anselm Pancoast, MD MEDICATIONS: Ancef 1 g; The antibiotic was administered within an appropriate time interval prior to skin puncture. ANESTHESIA/SEDATION: Versed 1.0 mg IV; Fentanyl 25 mcg IV; Moderate Sedation Time:  18 minutes The patient was continuously monitored during the procedure by the interventional radiology nurse under my direct supervision. FLUOROSCOPY TIME:  12 seconds, 1 mGy COMPLICATIONS: None immediate. PROCEDURE: Informed consent was obtained for placement of a tunneled dialysis catheter. The patient was placed supine on  the interventional table. Ultrasound confirmed a patent right internal jugular vein. Ultrasound images were obtained for documentation. The right neck and chest was prepped and draped in a sterile fashion. Maximal barrier sterile technique was utilized including caps, mask, sterile gowns, sterile gloves, sterile drape, hand hygiene and skin antiseptic. The right neck was anesthetized with 1% lidocaine. A small incision was made with #11 blade scalpel. A 21 gauge needle directed into the right internal jugular vein with ultrasound guidance. A micropuncture dilator set was placed. A 19 cm tip to cuff Palindrome catheter was selected. The skin below the right clavicle was anesthetized and a small incision was made with an #11 blade scalpel. A subcutaneous tunnel was formed to the vein dermatotomy site. The catheter was brought through the tunnel. The vein dermatotomy site was dilated to accommodate a peel-away sheath. The catheter was placed through the peel-away sheath and directed into the central venous structures. The tip of the catheter was placed at the superior cavoatrial junction with fluoroscopy. Fluoroscopic images were obtained for documentation. Both lumens were found to aspirate and flush well. The proper amount of heparin was flushed in both lumens. The vein dermatotomy site was closed using a single layer of absorbable suture and Dermabond.  Surgifoam was placed in subcutaneous tract. The catheter was secured to the skin using Prolene suture. IMPRESSION: Successful placement of a right jugular tunneled dialysis catheter using ultrasound and fluoroscopic guidance. Electronically Signed   By: Markus Daft M.D.   On: 09/29/2020 17:35   IR US Guide Vasc Access Right  Result Date: 09/29/2020 INDICATION: 55 year old with chronic kidney disease and needs a hemodialysis catheter. EXAM: FLUOROSCOPIC AND ULTRASOUND GUIDED PLACEMENT OF A TUNNELED DIALYSIS CATHETER Physician: Stephan Minister. Anselm Pancoast, MD MEDICATIONS: Ancef 1 g; The antibiotic was administered within an appropriate time interval prior to skin puncture. ANESTHESIA/SEDATION: Versed 1.0 mg IV; Fentanyl 25 mcg IV; Moderate Sedation Time:  18 minutes The patient was continuously monitored during the procedure by the interventional radiology nurse under my direct supervision. FLUOROSCOPY TIME:  12 seconds, 1 mGy COMPLICATIONS: None immediate. PROCEDURE: Informed consent was obtained for placement of a tunneled dialysis catheter. The patient was placed supine on the interventional table. Ultrasound confirmed a patent right internal jugular vein. Ultrasound images were obtained for documentation. The right neck and chest was prepped and draped in a sterile fashion. Maximal barrier sterile technique was utilized including caps, mask, sterile gowns, sterile gloves, sterile drape, hand hygiene and skin antiseptic. The right neck was anesthetized with 1% lidocaine. A small incision was made with #11 blade scalpel. A 21 gauge needle directed into the right internal jugular vein with ultrasound guidance. A micropuncture dilator set was placed. A 19 cm tip to cuff Palindrome catheter was selected. The skin below the right clavicle was anesthetized and a small incision was made with an #11 blade scalpel. A subcutaneous tunnel was formed to the vein dermatotomy site. The catheter was brought through the tunnel. The vein  dermatotomy site was dilated to accommodate a peel-away sheath. The catheter was placed through the peel-away sheath and directed into the central venous structures. The tip of the catheter was placed at the superior cavoatrial junction with fluoroscopy. Fluoroscopic images were obtained for documentation. Both lumens were found to aspirate and flush well. The proper amount of heparin was flushed in both lumens. The vein dermatotomy site was closed using a single layer of absorbable suture and Dermabond. Surgifoam was placed in subcutaneous tract. The catheter was secured to  the skin using Prolene suture. IMPRESSION: Successful placement of a right jugular tunneled dialysis catheter using ultrasound and fluoroscopic guidance. Electronically Signed   By: Markus Daft M.D.   On: 09/29/2020 17:35   DG Chest Port 1 View  Result Date: 10/03/2020 CLINICAL DATA:  Shortness of breath EXAM: PORTABLE CHEST 1 VIEW COMPARISON:  09/26/2020 FINDINGS: Cardiac shadow is enlarged but stable. Dialysis catheter is now seen in satisfactory position on the right. Lungs are well aerated bilaterally. Resolution of previously seen basilar opacities. No new focal abnormality is noted. IMPRESSION: No acute abnormality seen. Electronically Signed   By: Inez Catalina M.D.   On: 10/03/2020 14:59   ECHOCARDIOGRAM COMPLETE  Result Date: 09/27/2020    ECHOCARDIOGRAM REPORT   Patient Name:   KINZI DRUM Date of Exam: 09/27/2020 Medical Rec #:  [id number]    Height:       62.0 in Accession #:    [id number]   Weight:       140.0 lb Date of Birth:  [birth year]   BSA:          1.643 m Patient Age:    76 years     BP:           143/62 mmHg Patient Gender: F            HR:           86 bpm. Exam Location:  Inpatient Procedure: 2D Echo, Cardiac Doppler and Color Doppler Indications:    Pericardial Effusion  History:        Patient has prior history of Echocardiogram examinations, most                 recent 06/11/2020.  Sonographer:    Merrie Roof RDCS  Referring Phys: [place]  1. Left ventricular ejection fraction, by estimation, is 70 to 75%. The left ventricle has hyperdynamic function. The left ventricle has no regional wall motion abnormalities. Left ventricular diastolic function could not be evaluated.  2. Right ventricular systolic function is normal. The right ventricular size is normal. There is mildly elevated pulmonary artery systolic pressure. The estimated right ventricular systolic pressure is 123456 mmHg.  3. A small pericardial effusion is present. The pericardial effusion is circumferential. There is no evidence of cardiac tamponade.  4. The mitral valve is normal in structure. No evidence of mitral valve regurgitation. No evidence of mitral stenosis.  5. The aortic valve is tricuspid. Aortic valve regurgitation is not visualized. Mild aortic valve sclerosis is present, with no evidence of aortic valve stenosis.  6. The inferior vena cava is normal in size with greater than 50% respiratory variability, suggesting right atrial pressure of 3 mmHg. FINDINGS  Left Ventricle: Left ventricular ejection fraction, by estimation, is 70 to 75%. The left ventricle has hyperdynamic function. The left ventricle has no regional wall motion abnormalities. The left ventricular internal cavity size was normal in size. There is no left ventricular hypertrophy. Left ventricular diastolic function could not be evaluated. Right Ventricle: The right ventricular size is normal. No increase in right ventricular wall thickness. Right ventricular systolic function is normal. There is mildly elevated pulmonary artery systolic pressure. The tricuspid regurgitant velocity is 2.94  m/s, and with an assumed right atrial pressure of 3 mmHg, the estimated right ventricular systolic pressure is 123456 mmHg. Left Atrium: Left atrial size was normal in size. Right Atrium: Right atrial size was normal in size. Pericardium: A small pericardial effusion is  present. The pericardial effusion is circumferential. There is no evidence of cardiac tamponade. Mitral Valve: The mitral valve is normal in structure. No evidence of mitral valve regurgitation. No evidence of mitral valve stenosis. Tricuspid Valve: The tricuspid valve is normal in structure. Tricuspid valve regurgitation is trivial. No evidence of tricuspid stenosis. Aortic Valve: The aortic valve is tricuspid. Aortic valve regurgitation is not visualized. Mild aortic valve sclerosis is present, with no evidence of aortic valve stenosis. Aortic valve mean gradient measures 8.0 mmHg. Aortic valve peak gradient measures 13.8 mmHg. Aortic valve area, by VTI measures 1.96 cm. Pulmonic Valve: The pulmonic valve was normal in structure. Pulmonic valve regurgitation is not visualized. No evidence of pulmonic stenosis. Aorta: The aortic root is normal in size and structure. Venous: The inferior vena cava is normal in size with greater than 50% respiratory variability, suggesting right atrial pressure of 3 mmHg. IAS/Shunts: No atrial level shunt detected by color flow Doppler.  LEFT VENTRICLE PLAX 2D LVIDd:         4.10 cm  Diastology LVIDs:         2.30 cm  LV e' medial:  6.96 cm/s LV PW:         1.30 cm  LV e' lateral: 8.49 cm/s LV IVS:        1.00 cm LVOT diam:     1.90 cm LV SV:         66 LV SV Index:   40 LVOT Area:     2.84 cm  RIGHT VENTRICLE          IVC RV Basal diam:  3.10 cm  IVC diam: 1.20 cm LEFT ATRIUM             Index       RIGHT ATRIUM           Index LA diam:        3.50 cm 2.13 cm/m  RA Area:     15.10 cm LA Vol (A2C):   63.2 ml 38.47 ml/m RA Volume:   37.60 ml  22.89 ml/m LA Vol (A4C):   50.5 ml 30.74 ml/m LA Biplane Vol: 57.1 ml 34.76 ml/m  AORTIC VALVE AV Area (Vmax):    2.06 cm AV Area (Vmean):   1.99 cm AV Area (VTI):     1.96 cm AV Vmax:           186.00 cm/s AV Vmean:          128.000 cm/s AV VTI:            0.338 m AV Peak Grad:      13.8 mmHg AV Mean Grad:      8.0 mmHg LVOT Vmax:          135.00 cm/s LVOT Vmean:        90.000 cm/s LVOT VTI:          0.233 m LVOT/AV VTI ratio: 0.69  AORTA Ao Root diam: 3.00 cm Ao Asc diam:  2.55 cm TRICUSPID VALVE TR Peak grad:   34.6 mmHg TR Vmax:        294.00 cm/s  SHUNTS Systemic VTI:  0.23 m Systemic Diam: 1.90 cm Fransico Him MD Electronically signed by Fransico Him MD Signature Date/Time: 09/27/2020/6:32:09 PM    Final    DG HIP UNILAT WITH PELVIS 1V RIGHT  Result Date: 10/03/2020 CLINICAL DATA:  Right hip pain. EXAM: DG HIP (WITH OR WITHOUT PELVIS) 1V RIGHT COMPARISON:  None. FINDINGS: There  is no evidence of hip fracture or dislocation. There is no evidence of arthropathy or other focal bone abnormality. IMPRESSION: Negative. Electronically Signed   By: Marijo Conception M.D.   On: 10/03/2020 21:09   VAS [place] LOWER EXTREMITY VENOUS (DVT) (ONLY [facility] & [facility] 7a-7p)  Result Date: 09/26/2020  Lower Venous DVT Study Patient Name:  SHUNTEL JENDRO  Date of Exam:   09/26/2020 Medical Rec #: [id number]     Accession #:    [id number] Date of Birth: [birth year]    Patient Gender: F Patient Age:   54Y Exam Location:  [facility] Procedure:      VAS [place] LOWER EXTREMITY VENOUS (DVT) Referring Phys: [id number] LAURA A MURPHY --------------------------------------------------------------------------------  Indications: Edema.  Comparison Study: No previous exams Performing Technologist: Rogelia Rohrer  Examination Guidelines: A complete evaluation includes B-mode imaging, spectral Doppler, color Doppler, and power Doppler as needed of all accessible portions of each vessel. Bilateral testing is considered an integral part of a complete examination. Limited examinations for reoccurring indications may be performed as noted. The reflux portion of the exam is performed with the patient in reverse Trendelenburg.  +---------+---------------+---------+-----------+----------+--------------+ RIGHT    CompressibilityPhasicitySpontaneityPropertiesThrombus Aging  +---------+---------------+---------+-----------+----------+--------------+ CFV      Full           Yes      Yes                                 +---------+---------------+---------+-----------+----------+--------------+ SFJ      Full                                                        +---------+---------------+---------+-----------+----------+--------------+ FV Prox  Full           Yes      Yes                                 +---------+---------------+---------+-----------+----------+--------------+ FV Mid   Full           Yes      Yes                                 +---------+---------------+---------+-----------+----------+--------------+ FV DistalFull           Yes      Yes                                 +---------+---------------+---------+-----------+----------+--------------+ PFV      Full                                                        +---------+---------------+---------+-----------+----------+--------------+ POP      Full           Yes      Yes                                 +---------+---------------+---------+-----------+----------+--------------+  PTV      Full                                                        +---------+---------------+---------+-----------+----------+--------------+ PERO     Full                                                        +---------+---------------+---------+-----------+----------+--------------+   +----+---------------+---------+-----------+----------+--------------+ LEFTCompressibilityPhasicitySpontaneityPropertiesThrombus Aging +----+---------------+---------+-----------+----------+--------------+ CFV Full           Yes      Yes                                 +----+---------------+---------+-----------+----------+--------------+     Summary: RIGHT: - There is no evidence of deep vein thrombosis in the lower extremity. - There is no evidence of superficial venous thrombosis.   - No cystic structure found in the popliteal fossa. Subcutaneous edema from mid thigh to ankle  LEFT: - No evidence of common femoral vein obstruction.  *See table(s) above for measurements and observations. Electronically signed by Servando Snare MD on 09/26/2020 at 4:55:05 PM.    Final    VAS [place] UPPER EXT VEIN MAPPING (PRE-OP AVF)  Result Date: 09/29/2020 [place] Patient Name:  TANAY SKAJA  Date of Exam:   09/29/2020 Medical Rec #: [id number]     Accession #:    [id number] Date of Birth: 01-Jul-1965    Patient Gender: F Patient Age:   17Y Exam Location:  [facility] Procedure:      VAS [place] UPPER EXT VEIN MAPPING (PRE-OP AVF) Referring Phys: [id number] KELLIE GOLDSBOROUGH --------------------------------------------------------------------------------  Indications: Pre-access. Comparison Study: no prior Performing Technologist: Abram Sander RVS  Examination Guidelines: A complete evaluation includes B-mode imaging, spectral Doppler, color Doppler, and power Doppler as needed of all accessible portions of each vessel. Bilateral testing is considered an integral part of a complete examination. Limited examinations for reoccurring indications may be performed as noted. +-----------------+-------------+----------+--------+ Right Cephalic   Diameter (cm)Depth (cm)Findings +-----------------+-------------+----------+--------+ Shoulder             0.22        1.35            +-----------------+-------------+----------+--------+ Prox upper arm       0.20        1.28            +-----------------+-------------+----------+--------+ Mid upper arm        0.21        1.37            +-----------------+-------------+----------+--------+ Dist upper arm       0.10        0.47            +-----------------+-------------+----------+--------+ Antecubital fossa    0.16        0.25            +-----------------+-------------+----------+--------+ Prox forearm         0.21         0.82            +-----------------+-------------+----------+--------+ Mid forearm  0.22        0.76            +-----------------+-------------+----------+--------+ Dist forearm         0.22        0.38            +-----------------+-------------+----------+--------+ Wrist                0.23        0.32            +-----------------+-------------+----------+--------+ +-----------------+-------------+----------+--------+ Right Basilic    Diameter (cm)Depth (cm)Findings +-----------------+-------------+----------+--------+ Prox upper arm       0.35        0.99            +-----------------+-------------+----------+--------+ Mid upper arm        0.27        1.19            +-----------------+-------------+----------+--------+ Dist upper arm       0.29        1.26            +-----------------+-------------+----------+--------+ Antecubital fossa    0.21        1.03            +-----------------+-------------+----------+--------+ Prox forearm         0.21        0.76            +-----------------+-------------+----------+--------+ Mid forearm          0.20        0.46            +-----------------+-------------+----------+--------+ Distal forearm       0.21        0.36            +-----------------+-------------+----------+--------+ Wrist                0.18        0.39            +-----------------+-------------+----------+--------+ +-----------------+-------------+----------+---------+ Left Cephalic    Diameter (cm)Depth (cm)Findings  +-----------------+-------------+----------+---------+ Shoulder             0.31        1.02             +-----------------+-------------+----------+---------+ Prox upper arm       0.24        1.07             +-----------------+-------------+----------+---------+ Mid upper arm        0.13        1.23   branching +-----------------+-------------+----------+---------+ Dist upper arm       0.13         1.05             +-----------------+-------------+----------+---------+ Antecubital fossa    0.20        2.97             +-----------------+-------------+----------+---------+ Prox forearm         0.18        0.65             +-----------------+-------------+----------+---------+ Mid forearm          0.24        0.52             +-----------------+-------------+----------+---------+ Dist forearm         0.22        0.34             +-----------------+-------------+----------+---------+ +-----------------+-------------+----------+---------+  Left Basilic     Diameter (cm)Depth (cm)Findings  +-----------------+-------------+----------+---------+ Prox upper arm       0.38        1.48             +-----------------+-------------+----------+---------+ Mid upper arm        0.25        1.42             +-----------------+-------------+----------+---------+ Dist upper arm       0.27        1.18             +-----------------+-------------+----------+---------+ Antecubital fossa    0.26        0.68             +-----------------+-------------+----------+---------+ Prox forearm         0.22        0.30             +-----------------+-------------+----------+---------+ Mid forearm          0.18        0.23             +-----------------+-------------+----------+---------+ Distal forearm       0.19        0.28             +-----------------+-------------+----------+---------+ Elbow                                   branching +-----------------+-------------+----------+---------+ *See table(s) above for measurements and observations.  Diagnosing physician: Monica Martinez MD Electronically signed by Monica Martinez MD on 09/29/2020 at 4:45:25 PM.    Final     Lab Data:  CBC: Recent Labs  Lab 10/10/20 0038 10/11/20 0111 10/15/20 1500  WBC 7.7 6.9 8.6  HGB 8.0* 8.2* 9.8*  HCT 24.6* 25.8* 31.7*  MCV 91.1 91.8 94.9  PLT 218 265 405*   Basic  Metabolic Panel: Recent Labs  Lab 10/12/20 0419 10/13/20 0104 10/14/20 0147 10/15/20 0239 10/16/20 0336  NA 128* 128* 131* 128* 132*  K 3.7 4.0 3.9 3.7 3.5  CL 94* 94* 97* 92* 94*  CO2 '27 24 28 27 30  '$ GLUCOSE 147* 200* 177* 134* 90  BUN 14 27* '12 19 8  '$ CREATININE 4.76* 6.54* 3.36* 4.81* 3.23*  CALCIUM 8.4* 8.2* 8.2* 9.0 8.7*  PHOS 4.2 5.2* 3.1 3.9 3.7   GFR: Estimated Creatinine Clearance: 15.4 mL/min (A) (by C-G formula based on SCr of 3.23 mg/dL (H)). Liver Function Tests: Recent Labs  Lab 10/12/20 0419 10/13/20 0104 10/14/20 0147 10/15/20 0239 10/16/20 0336  ALBUMIN 2.3* 2.2* 2.2* 2.4* 2.7*   No results for input(s): LIPASE, AMYLASE in the last 168 hours. No results for input(s): AMMONIA in the last 168 hours. Coagulation Profile: No results for input(s): INR, PROTIME in the last 168 hours. Cardiac Enzymes: No results for input(s): CKTOTAL, CKMB, CKMBINDEX, TROPONINI in the last 168 hours. BNP (last 3 results) No results for input(s): PROBNP in the last 8760 hours. HbA1C: No results for input(s): HGBA1C in the last 72 hours. CBG: Recent Labs  Lab 10/15/20 1815 10/15/20 2141 10/16/20 0636 10/16/20 0751 10/16/20 1138  GLUCAP 159* 296* 79 180* 143*   Lipid Profile: No results for input(s): CHOL, HDL, LDLCALC, TRIG, CHOLHDL, LDLDIRECT in the last 72 hours. Thyroid Function Tests: No results for input(s): TSH, T4TOTAL, FREET4, T3FREE, THYROIDAB in the last 72 hours. Anemia Panel: No results for  input(s): VITAMINB12, FOLATE, FERRITIN, TIBC, IRON, RETICCTPCT in the last 72 hours. Urine analysis:    Component Value Date/Time   COLORURINE STRAW (A) 09/12/2020 2208   APPEARANCEUR CLEAR 09/12/2020 2208   LABSPEC 1.007 09/12/2020 2208   PHURINE 6.0 09/12/2020 2208   GLUCOSEU >=500 (A) 09/12/2020 2208   HGBUR NEGATIVE 09/12/2020 2208   BILIRUBINUR NEGATIVE 09/12/2020 2208   KETONESUR NEGATIVE 09/12/2020 2208   PROTEINUR >=300 (A) 09/12/2020 2208   NITRITE  NEGATIVE 09/12/2020 2208   LEUKOCYTESUR NEGATIVE 09/12/2020 2208     Daielle Melcher M.D. [facility] 10/16/2020, 1:59 PM  Available via Epic secure chat 7am-7pm After 7 pm, please refer to night coverage provider listed on amion.

## 2020-10-16 NOTE — Progress Notes (Signed)
Physical Therapy Treatment Patient Details Name: Hannah [name] MRN: [id number] DOB: 28-Jul-1965 Today's Date: 10/16/2020    History of Present Illness 55 y.o. female presented with shortness of breath and lower extremity swelling. Admitted on 09/26/20 with Volume overload.  Additionally, pt with ESRD and HD has been initiated this admission.  Pt underwent AV fistula placement 5/19.  Has RLE weakness and swelling in psoas mm on R, limited mobiltiy.  Pt with medical history significant of CKD 5, anemia, CVA, diabetes, hypertension, hyperlipidemia, multiple sclerosis    PT Comments    Pt was seen for progressing her mobility which ended up being sit to stand transfers.  Pt was tired from being up and walking earlier, but did perform there ex with PT to work on hips and knees for more stable gait.  Pt is avoiding the 90 deg flexion limit on L hip and still able to tolerate exercises.  Follow along with her to progress the proximal L hip strength that she describes as lacking in her tolerance and control of standing and stepping.     Follow Up Recommendations  CIR     Equipment Recommendations  None recommended by PT    Recommendations for Other Services       Precautions / Restrictions Precautions Precautions: Fall Precaution Comments: baseline RLE weak but has edema in psoas Required Braces or Orthoses: Other Brace Restrictions Weight Bearing Restrictions: No    Mobility  Bed Mobility Overal bed mobility: Needs Assistance Bed Mobility: Supine to Sit     Supine to sit: Supervision;HOB elevated     General bed mobility comments: up in chair when PT arrives    Transfers Overall transfer level: Needs assistance Equipment used: Rolling walker (2 wheeled) Transfers: Sit to/from Stand Sit to Stand: Mod assist;From elevated surface;Min assist Stand pivot transfers: Min assist       General transfer comment: min mod to fully stand  Ambulation/Gait                  Stairs             Wheelchair Mobility    Modified Rankin (Stroke Patients Only)       Balance Overall balance assessment: Needs assistance Sitting-balance support: No upper extremity supported Sitting balance-Leahy Scale: Fair     Standing balance support: Bilateral upper extremity supported;During functional activity Standing balance-Leahy Scale: Poor                              Cognition Arousal/Alertness: Awake/alert Behavior During Therapy: WFL for tasks assessed/performed Overall Cognitive Status: Within Functional Limits for tasks assessed                                 General Comments: speech changes from stroke but is on point in conversation; pt acting loopy this session      Exercises General Exercises - Lower Extremity Quad Sets: AROM;10 reps Gluteal Sets: AROM;10 reps Long Arc Quad: AROM;10 reps Heel Slides: AROM;10 reps Hip ABduction/ADduction: AROM;10 reps Mini-Sqauts:  (chair stands)    General Comments General comments (skin integrity, edema, etc.): sleepy but performing skills as asked      Pertinent Vitals/Pain Pain Assessment: Faces Faces Pain Scale: Hurts little more Pain Location: R hip Pain Descriptors / Indicators: Grimacing;Guarding Pain Intervention(s): Limited activity within patient's tolerance;Monitored during session;Repositioned    Home Living  Prior Function            PT Goals (current goals can now be found in the care plan section) Acute Rehab PT Goals Patient Stated Goal: go to rehab    Frequency    Min 3X/week      PT Plan Current plan remains appropriate    Co-evaluation              AM-PAC PT "6 Clicks" Mobility   Outcome Measure  Help needed turning from your back to your side while in a flat bed without using bedrails?: A Little Help needed moving from lying on your back to sitting on the side of a flat bed without using  bedrails?: A Little Help needed moving to and from a bed to a chair (including a wheelchair)?: A Little Help needed standing up from a chair using your arms (e.g., wheelchair or bedside chair)?: A Lot Help needed to walk in hospital room?: A Lot Help needed climbing 3-5 steps with a railing? : A Lot 6 Click Score: 15    End of Session Equipment Utilized During Treatment: Gait belt Activity Tolerance: Patient limited by lethargy Patient left: with call bell/phone within reach;in chair Nurse Communication: Mobility status PT Visit Diagnosis: Other abnormalities of gait and mobility (R26.89);Muscle weakness (generalized) (M62.81);Difficulty in walking, not elsewhere classified (R26.2);Adult, failure to thrive (R62.7)     Time: [id number] PT Time Calculation (min) (ACUTE ONLY): 20 min  Charges:  $Therapeutic Exercise: 8-22 mins            Ramond Dial 10/16/2020, 4:00 PM  Mee Hives, PT MS Acute Rehab Dept. Number: [place] and [place]

## 2020-10-16 NOTE — Progress Notes (Signed)
Patient ID: Hannah [name], female   DOB: 20-May-1965, 55 y.o.   MRN: [id number] [facility] Progress Note   Assessment/ Plan:   1.  End-stage renal disease: From progressive chronic kidney disease and presentation with uremic symptoms needing initiation of dialysis.  Status post right brachiobasilic AV fistula by Dr. Donzetta Matters on 5/19 and on schedule for dialysis today via [facility] (MWF while here and will transition to TTS hemodialysis schedule second shift at [facility]).  I will order for her next hemodialysis treatment to be done on Saturday so that this is commensurate with her outpatient schedule.  She is awaiting an appeal decision for admission to CIR for deconditioning/management status post psoas hematoma with recent CVA. 2.  Hypertension: Blood pressure improving with titration of anti-hypertensive therapy and pain management.  3.  Anemia of chronic disease: Without overt blood loss and anticipated to have a drop from source hematoma.  We will continue to follow closely on high-dose ESA. 4.  Secondary hyperparathyroidism: Calcium and phosphorus level within acceptable range, PTH at goal at 84.  Continue to monitor on renal diet. 5.  History of recent CVA with right-sided spastic hemiparesis  Subjective:   Denies any acute events overnight and tolerated hemodialysis well yesterday.  Awaiting CIR insurance appeal.   Objective:   BP 131/60 (BP Location: Left Arm)   Pulse (!) 110   Temp 98.5 F (36.9 C) (Oral)   Resp 16   Ht '5\' 2"'$  (1.575 m)   Wt 57.2 kg   SpO2 96%   BMI 23.06 kg/m   Intake/Output Summary (Last 24 hours) at 10/16/2020 0955 Last data filed at 10/15/2020 1740 Gross per 24 hour  Intake --  Output 2849 ml  Net -2849 ml   Weight change: -1.7 kg  Physical Exam: Gen: Comfortably resting in bed CVS: Pulse regular tachycardia, S1 and S2 normal Resp: Clear to auscultation bilaterally without distinct rales or rhonchi.  Right IJ TDC in  place Abd: Soft, obese, nontender, bowel sounds normal Ext: No lower extremity edema, right upper arm AV fistula with palpable thrill  Imaging: No results found.  Labs: BMET Recent Labs  Lab 10/10/20 0038 10/11/20 0111 10/12/20 0419 10/13/20 0104 10/14/20 0147 10/15/20 0239 10/16/20 0336  NA 125* 130* 128* 128* 131* 128* 132*  K 3.5 3.3* 3.7 4.0 3.9 3.7 3.5  CL 92* 95* 94* 94* 97* 92* 94*  CO2 '24 28 27 24 28 27 30  '$ GLUCOSE 415* 196* 147* 200* 177* 134* 90  BUN 26* 8 14 27* '12 19 8  '$ CREATININE 5.32* 2.85* 4.76* 6.54* 3.36* 4.81* 3.23*  CALCIUM 8.1* 8.1* 8.4* 8.2* 8.2* 9.0 8.7*  PHOS 3.3 2.4* 4.2 5.2* 3.1 3.9 3.7   CBC Recent Labs  Lab 10/10/20 0038 10/11/20 0111 10/15/20 1500  WBC 7.7 6.9 8.6  HGB 8.0* 8.2* 9.8*  HCT 24.6* 25.8* 31.7*  MCV 91.1 91.8 94.9  PLT 218 265 405*    Medications:    . amLODipine  10 mg Oral Daily  . carvedilol  12.5 mg Oral BID WC  . Chlorhexidine Gluconate Cloth  6 each Topical Q0600  . Chlorhexidine Gluconate Cloth  6 each Topical Q0600  . darbepoetin (ARANESP) injection - DIALYSIS  200 mcg Intravenous Q Mon-HD  . diclofenac Sodium  2 g Topical QID  . famotidine  20 mg Oral Daily  . gabapentin  100 mg Oral QHS  . hydrALAZINE  50 mg Oral Q8H  . insulin aspart  0-5  Units Subcutaneous QHS  . insulin aspart  0-9 Units Subcutaneous TID WC  . insulin glargine  15 Units Subcutaneous QHS  . melatonin  3 mg Oral QHS  . metoCLOPramide  5 mg Oral BID  . polyethylene glycol  17 g Oral Daily  . rosuvastatin  10 mg Oral Daily  . senna-docusate  1 tablet Oral BID  . sodium chloride flush  3 mL Intravenous Q12H  . tiZANidine  2 mg Oral TID   Elmarie Shiley, MD 10/16/2020, 9:55 AM

## 2020-10-16 NOTE — Progress Notes (Signed)
Occupational Therapy Treatment Patient Details Name: Hannah [name] MRN: [id number] DOB: Jul 25, 1965 Today's Date: 10/16/2020    History of present illness 55 y.o. female presented with shortness of breath and lower extremity swelling. Admitted on 09/26/20 with Volume overload.  Additionally, pt with ESRD and HD has been initiated this admission.  Pt underwent AV fistula placement 5/19.  Has RLE weakness and swelling in psoas mm on R, limited mobiltiy.  Pt with medical history significant of CKD 5, anemia, CVA, diabetes, hypertension, hyperlipidemia, multiple sclerosis   OT comments  Hannah [name] continues to progress towards her goals. Pt was supervision level for bed mobility with HOB elevated and use of bed rails. She required increased A levels for boosting into standing today, likely due to not being OOB yet. She was mod A to boost and required several attempts. Pt also demonstrated a loopy/lackadaisical affect today which limited functional ambulation for safety concerns. Stand-pivot transfer was min A. Pt was set up at chair level for grooming and upper body bathing. Pt continues to benefit from acute OT to progress function and indep in ADLs and mobility. D/c plan remains appropriate.     Follow Up Recommendations  CIR    Equipment Recommendations  None recommended by OT       Precautions / Restrictions Precautions Precautions: Fall Precaution Comments: baseline RLE weak but has edema in psoas Restrictions Weight Bearing Restrictions: No       Mobility Bed Mobility Overal bed mobility: Needs Assistance Bed Mobility: Supine to Sit     Supine to sit: Supervision;HOB elevated     General bed mobility comments: incrased time to get to EOB, HOB elevated and use of bed rails    Transfers Overall transfer level: Needs assistance Equipment used: Rolling walker (2 wheeled)   Sit to Stand: Mod assist;From elevated surface Stand pivot transfers: Min assist       General transfer  comment: mod A to boost into standing, min A for stand pivot to chair - further ambulation was deferred this session due to pt acting very loopy likely from pain medication    Balance Overall balance assessment: Needs assistance Sitting-balance support: No upper extremity supported;Feet supported Sitting balance-Leahy Scale: Fair     Standing balance support: Bilateral upper extremity supported;During functional activity Standing balance-Leahy Scale: Poor         ADL either performed or assessed with clinical judgement   ADL Overall ADL's : Needs assistance/impaired Eating/Feeding: Set up;Sitting   Grooming: Wash/dry hands;Wash/dry face;Oral care;Applying deodorant;Set up;Sitting   Upper Body Bathing: Set up;Sitting      Functional mobility during ADLs: Moderate assistance;Rolling walker (increased assist levels today for boosting from sit>stand with rw; pt required mod A for physical lifting and min A to gain balance in standing) General ADL Comments: pt set up with grooming and bathing tasks while sitting               Cognition Arousal/Alertness: Awake/alert Behavior During Therapy: WFL for tasks assessed/performed Overall Cognitive Status: Within Functional Limits for tasks assessed           General Comments: speech changes from stroke but is on point in conversation; pt acting loopy this session              General Comments no new concerns, pt with loopy/silly affect this session    Pertinent Vitals/ Pain       Pain Assessment: Faces Faces Pain Scale: Hurts a little bit Pain Location: R hip Pain Descriptors /  Indicators: Grimacing;Guarding Pain Intervention(s): Limited activity within patient's tolerance;Monitored during session         Frequency  Min 2X/week        Progress Toward Goals  OT Goals(current goals can now be found in the care plan section)  Progress towards OT goals: Progressing toward goals  Acute Rehab OT Goals Patient  Stated Goal: rehab before home OT Goal Formulation: With patient Time For Goal Achievement: 10/28/20 Potential to Achieve Goals: Good ADL Goals Pt Will Perform Grooming: with supervision;standing Pt Will Perform Upper Body Bathing: with supervision;sitting Pt Will Perform Lower Body Bathing: with supervision;sit to/from stand Pt Will Perform Lower Body Dressing: with modified independence;sit to/from stand Pt Will Transfer to Toilet: with supervision;ambulating;regular height toilet Pt Will Perform Toileting - Clothing Manipulation and hygiene: with supervision;sit to/from stand Additional ADL Goal #1: Pt will be Mod I and OOB for basic ADLs (increased time) Additional ADL Goal #2: Patient will identify at least 3 energy conservation strategies to employ at home in order to maximize function and quality of life and decrease caregiver burden while preventing exacerbation of symptoms and rehospitalization.  Plan Discharge plan remains appropriate;Frequency remains appropriate       AM-PAC OT "6 Clicks" Daily Activity     Outcome Measure   Help from another person eating meals?: A Little Help from another person taking care of personal grooming?: A Little Help from another person toileting, which includes using toliet, bedpan, or urinal?: A Little Help from another person bathing (including washing, rinsing, drying)?: A Little Help from another person to put on and taking off regular upper body clothing?: A Little Help from another person to put on and taking off regular lower body clothing?: A Lot 6 Click Score: 17    End of Session Equipment Utilized During Treatment: Gait belt;Rolling walker  OT Visit Diagnosis: Unsteadiness on feet (R26.81);Muscle weakness (generalized) (M62.81);Hemiplegia and hemiparesis;Other abnormalities of gait and mobility (R26.89) Hemiplegia - Right/Left: Right Hemiplegia - dominant/non-dominant: Dominant Hemiplegia - caused by: Cerebral infarction Pain -  Right/Left: Right Pain - part of body: Hip;Leg   Activity Tolerance Patient tolerated treatment well   Patient Left in chair;with call bell/phone within reach   Nurse Communication Mobility status        Time: [id number] OT Time Calculation (min): 19 min  Charges: OT General Charges $OT Visit: 1 Visit OT Treatments $Self Care/Home Management : 8-22 mins    Alira Fretwell A Ainslie Mazurek 10/16/2020, 2:20 PM

## 2020-10-17 LAB — RENAL FUNCTION PANEL
Albumin: 2.6 g/dL — ABNORMAL LOW (ref 3.5–5.0)
Anion gap: 7 (ref 5–15)
BUN: 19 mg/dL (ref 6–20)
CO2: 28 mmol/L (ref 22–32)
Calcium: 8.7 mg/dL — ABNORMAL LOW (ref 8.9–10.3)
Chloride: 97 mmol/L — ABNORMAL LOW (ref 98–111)
Creatinine, Ser: 5.49 mg/dL — ABNORMAL HIGH (ref 0.44–1.00)
GFR, Estimated: 9 mL/min — ABNORMAL LOW (ref >60)
Glucose, Bld: 112 mg/dL — ABNORMAL HIGH (ref 70–99)
Phosphorus: 4.5 mg/dL (ref 2.5–4.6)
Potassium: 3.5 mmol/L (ref 3.5–5.1)
Sodium: 132 mmol/L — ABNORMAL LOW (ref 135–145)

## 2020-10-17 LAB — GLUCOSE, CAPILLARY
Glucose-Capillary: 164 mg/dL — ABNORMAL HIGH (ref 70–99)
Glucose-Capillary: 271 mg/dL — ABNORMAL HIGH (ref 70–99)
Glucose-Capillary: 152 mg/dL — ABNORMAL HIGH (ref 70–99)
Glucose-Capillary: 182 mg/dL — ABNORMAL HIGH (ref 70–99)
Glucose-Capillary: 137 mg/dL — ABNORMAL HIGH (ref 70–99)

## 2020-10-17 NOTE — Progress Notes (Signed)
Renal Navigator appreciates update from C. Warren/CIR AC who states patient will be admitted to [facility] tomorrow. Navigator provided update on patient's plan to [facility] Admissions and outpatient [facility] clinic[facility].  Navigator will continue to follow.   Alphonzo Cruise, [place] Renal Navigator [phone]

## 2020-10-17 NOTE — Progress Notes (Signed)
Inpatient Rehab Admissions Coordinator:   I have insurance authorization a bed for this pt to admit to [facility] on Saturday (6/4).  Dr. Tana Coast in agreement.  Rehab MD (Dr. Ranell Patrick) to assess pt and confirm admission on Saturday.  Floor RN can call CIR at [phone] for report after 12pm on Saturday.  I have let pt/family and case manager know.    Shann Medal, PT, DPT Admissions Coordinator [phone] 10/17/20  11:41 AM

## 2020-10-17 NOTE — PMR Pre-admission (Signed)
PMR Admission Coordinator Pre-Admission Assessment  Patient: Hannah [name] is an 55 y.o., female MRN: [id number] DOB: 09-02-1965 Height: _0  (157.5 cm) Weight: 57.6 kg  Insurance Information HMO: yes    PPO:      PCP:      IPA:      80/20:      OTHER:  PRIMARY: U[employer] Medicare      Policy#: [id number]      Subscriber: pt CM Name: Mardene Celeste      Phone#: [phone]     Fax#: [phone] Pre-Cert#: [id number] Ideal for CIR provided by Mardene Celeste via expedited appeal with updates due to fax listed above on 6/9.       Employer:  Benefits:  Phone #: [phone]     Name:  Eff. Date: 08/15/20     Deduct: $0      Out of Pocket Max: $4500 (met [phone])      Life Max: n/a CIR: $325/day for days 1-5      SNF: 20 full days Outpatient: $30/visit     Co-Pay:  Home Health: 100%      Co-Pay:  DME: 80%     Co-Pay: 20% Providers:  SECONDARY:       Policy#:      Phone#:   Development worker, community:       Phone#:   The Engineer, petroleum" for patients in Inpatient Rehabilitation Facilities with attached "Privacy Act [place] Records" was provided and verbally reviewed with: Patient and Family  Emergency Contact Information Contact Information    Name Relation Home Work [place], [name] Daughter   [phone]   [name] Daughter   [phone]      Current Medical History  Patient Admitting Diagnosis: debility, new ESRD on HD  History of Present Illness: Pt is a 55 year female with CKD, anemia, HTN, DM, recent CVA (received CIR services), and MS who admitted to [facility] on 09/26/20 for worsening shortness of breath, LE swelling, and possible uremia.  Nephrology consulted and pt was felt to have progressed to ESRD with volume overload, now requiring dialysis.  Vascular was consulted and pt had AV fistula created on 5/19 per Dr. Donzetta Matters.  Hospital course complicated by debility and retroperitoneal bleed, recommendations for conservative management and hold plavix x2 weeks.   Therapy evaluations were completed and pt was recommended for CIR.     Patient's medical record from [name] has been reviewed by the rehabilitation admission coordinator and physician.  Past Medical History  Past Medical History:  Diagnosis Date  . Cerebral infarction ([place]) 07/12/2017   2009 first one, has had a total of 4 stokes  . Diabetes type 2, controlled ([place])   . Hemiparesis affecting right side as late effect of cerebrovascular accident (CVA) ([place])   . Hyperlipidemia   . Hypertension   . Multiple sclerosis ([place]) 2009  . Stroke [facility])     Family History   family history is not on file.  Prior Rehab/Hospitalizations Has the patient had prior rehab or hospitalizations prior to admission? Yes  Has the patient had major surgery during 100 days prior to admission? Yes   Current Medications  Current Facility-Administered Medications:  .  0.9 %  sodium chloride infusion, , Intravenous, PRN, Barbie Banner, PA-C, Stopped at 09/28/20 1354 .  acetaminophen (TYLENOL) tablet 650 mg, 650 mg, Oral, Q6H PRN, 650 mg at 10/16/20 1735 **OR** acetaminophen (TYLENOL) suppository 650 mg, 650 mg, Rectal, Q6H PRN, Setzer, Edman Circle, PA-C .  amLODipine (NORVASC) tablet 10 mg, 10 mg, Oral, Daily, Setzer, Edman Circle, PA-C, 10 mg at 10/16/20 7628 .  carvedilol (COREG) tablet 12.5 mg, 12.5 mg, Oral, BID WC, Elmarie Shiley, MD, 12.5 mg at 10/16/20 1735 .  Chlorhexidine Gluconate Cloth 2 % PADS 6 each, 6 each, Topical, Q0600, Barbie Banner, PA-C, 6 each at 10/14/20 0530 .  Chlorhexidine Gluconate Cloth 2 % PADS 6 each, 6 each, Topical, Q0600, Claudia Desanctis, MD, 6 each at 10/17/20 0539 .  Darbepoetin Alfa (ARANESP) injection 200 mcg, 200 mcg, Intravenous, Q Mon-HD, Reesa Chew, MD, 200 mcg at 10/13/20 1311 .  diclofenac Sodium (VOLTAREN) 1 % topical gel 2 g, 2 g, Topical, QID, Kc, Ramesh, MD, 2 g at 10/17/20 0852 .  diphenhydrAMINE (BENADRYL) capsule 25 mg, 25 mg, Oral, Q6H PRN, Kc, Ramesh, MD,  25 mg at 10/17/20 0851 .  famotidine (PEPCID) tablet 20 mg, 20 mg, Oral, Daily, Setzer, Edman Circle, PA-C, 20 mg at 10/17/20 3151 .  gabapentin (NEURONTIN) capsule 100 mg, 100 mg, Oral, QHS, Elmarie Shiley, MD, 100 mg at 10/16/20 2047 .  hydrALAZINE (APRESOLINE) tablet 50 mg, 50 mg, Oral, Q8H, Setzer, Edman Circle, PA-C, 50 mg at 10/16/20 2046 .  HYDROmorphone (DILAUDID) injection 0.25 mg, 0.25 mg, Intravenous, Q6H PRN, Dana Allan I, MD, 0.25 mg at 10/15/20 1537 .  insulin aspart (novoLOG) injection 0-5 Units, 0-5 Units, Subcutaneous, QHS, Kc, Ramesh, MD, 3 Units at 10/15/20 2205 .  insulin aspart (novoLOG) injection 0-9 Units, 0-9 Units, Subcutaneous, TID WC, Kc, Ramesh, MD, 5 Units at 10/17/20 1218 .  insulin glargine (LANTUS) injection 15 Units, 15 Units, Subcutaneous, QHS, Kc, Ramesh, MD, 15 Units at 10/16/20 2047 .  labetalol (NORMODYNE) injection 20 mg, 20 mg, Intravenous, Q3H PRN, Barbie Banner, PA-C, 20 mg at 10/06/20 0315 .  melatonin tablet 3 mg, 3 mg, Oral, QHS, Setzer, Edman Circle, PA-C, 3 mg at 10/16/20 2046 .  methocarbamol (ROBAXIN) tablet 500 mg, 500 mg, Oral, Q6H PRN, Kc, Ramesh, MD, 500 mg at 10/14/20 0740 .  metoCLOPramide (REGLAN) injection 10 mg, 10 mg, Intravenous, Q6H PRN, Barbie Banner, PA-C, 10 mg at 10/14/20 0739 .  metoCLOPramide (REGLAN) tablet 5 mg, 5 mg, Oral, BID, Setzer, Edman Circle, PA-C, 5 mg at 10/17/20 0851 .  oxyCODONE-acetaminophen (PERCOCET/ROXICET) 5-325 MG per tablet 1 tablet, 1 tablet, Oral, Q6H PRN, Dana Allan I, MD, 1 tablet at 10/15/20 2204 .  polyethylene glycol (MIRALAX / GLYCOLAX) packet 17 g, 17 g, Oral, Daily PRN, Barbie Banner, PA-C, 17 g at 10/05/20 1908 .  polyethylene glycol (MIRALAX / GLYCOLAX) packet 17 g, 17 g, Oral, Daily, Dana Allan I, MD, 17 g at 10/17/20 7616 .  rosuvastatin (CRESTOR) tablet 10 mg, 10 mg, Oral, Daily, Setzer, Edman Circle, PA-C, 10 mg at 10/17/20 0737 .  senna-docusate (Senokot-S) tablet 1 tablet, 1 tablet, Oral,  BID, Dana Allan I, MD, 1 tablet at 10/17/20 0851 .  sodium chloride flush (NS) 0.9 % injection 3 mL, 3 mL, Intravenous, Q12H, Setzer, Edman Circle, PA-C, 3 mL at 10/17/20 0853 .  tiZANidine (ZANAFLEX) tablet 2 mg, 2 mg, Oral, TID, Kc, Ramesh, MD, 2 mg at 10/17/20 0851  Patients Current Diet:  Diet Order            Diet renal/carb modified with fluid restriction Diet-HS Snack? Nothing; Fluid restriction: 1200 mL Fluid; Room service appropriate? Yes; Fluid consistency: Thin  Diet effective now  Precautions / Restrictions Precautions Precautions: Fall Precaution Comments: baseline RLE weak but has edema in psoas Restrictions Weight Bearing Restrictions: No   Has the patient had 2 or more falls or a fall with injury in the past year? No  Prior Activity Level Household: recently discharged from [place] earlier this year, has been using RW prior to admit and min guard to supervision for mobility and ADLs  Prior Functional Level Self Care: Did the patient need help bathing, dressing, using the toilet or eating? Needed some help  Indoor Mobility: Did the patient need assistance with walking from room to room (with or without device)? Needed some help  Stairs: Did the patient need assistance with internal or external stairs (with or without device)? Needed some help  Functional Cognition: Did the patient need help planning regular tasks such as shopping or remembering to take medications? Needed some help  Home Assistive Devices / [place] Devices/Equipment: Environmental consultant (specify type),Built-in shower seat Home Equipment: Walker - 4 wheels,Cane - single point,Wheelchair - manual,Wheelchair - power,Tub bench  Prior Device Use: Indicate devices/aids used by the patient prior to current illness, exacerbation or injury? Motorized wheelchair or scooter and Barista  Overall Cognitive Status: Within Functional Limits for tasks  assessed Orientation Level: Oriented X4 General Comments: speech changes from stroke but is on point in conversation; pt acting loopy this session    Extremity Assessment (includes Sensation/Coordination)  Upper Extremity Assessment: RUE deficits/detail RUE Deficits / Details: Baseline deficits. Very limited shoulder AROM to ~70 degrees and PROM to 90. Elbow to hand WFL, but weak, grossly 3/5. Able to use it to donn socks sitting EOB by crossing legs RUE Sensation: decreased light touch RUE Coordination: decreased fine motor,decreased gross motor  Lower Extremity Assessment: Defer to PT evaluation RLE Deficits / Details: ROM : ankle only to neutral, otherwise [facility]; MMT: 3/5 throughout LLE Deficits / Details: ROM grossly WFL, MMT 4+/5    ADLs  Overall ADL's : Needs assistance/impaired Eating/Feeding: Set up,Sitting Eating/Feeding Details (indicate cue type and reason): pt sitting in chair snacking at end of session Grooming: Wash/dry hands,Wash/dry face,Oral care,Applying deodorant,Set up,Sitting Grooming Details (indicate cue type and reason): pt sitting in chair at the sink to complete grooming tasks Upper Body Bathing: Set up,Sitting Upper Body Bathing Details (indicate cue type and reason): sitting EOB Lower Body Bathing: Min guard,Sit to/from stand Upper Body Dressing : Set up,Supervision/safety,Sitting Upper Body Dressing Details (indicate cue type and reason): sitting EOB Lower Body Dressing: Moderate assistance,Sitting/lateral leans Lower Body Dressing Details (indicate cue type and reason): Pt attempted to don sock while sitting EOB, she was able to reach L foot however not flexible enough to thread sock over toes; therapist place sock on toes and pt able to pull up Toilet Transfer: Moderate assistance,Stand-pivot,BSC Toilet Transfer Details (indicate cue type and reason): simulated transferring to recliner. Pt more weak than previous notes, she reported she had pain meds during  the night and she thought it was from that. Toileting- Water quality scientist and Hygiene: Min guard,Sitting/lateral lean Toileting - Clothing Manipulation Details (indicate cue type and reason): min guard A sit<>stand Functional mobility during ADLs: Moderate assistance,Rolling walker (increased assist levels today for boosting from sit>stand with rw; pt required mod A for physical lifting and min A to gain balance in standing) General ADL Comments: pt set up with grooming and bathing tasks while sitting    Mobility  Overal bed mobility: Needs Assistance Bed Mobility: Supine to Sit Rolling:  Mod assist Sidelying to sit: Min assist,HOB elevated Supine to sit: Supervision,HOB elevated Sit to supine: Min assist General bed mobility comments: up in chair when PT arrives    Transfers  Overall transfer level: Needs assistance Equipment used: Rolling walker (2 wheeled) Transfers: Sit to/from Stand Sit to Stand: Mod assist,From elevated surface,Min assist Stand pivot transfers: Min assist General transfer comment: min mod to fully stand    Ambulation / Gait / Stairs / Wheelchair Mobility  Ambulation/Gait Ambulation/Gait assistance: Herbalist (Feet): 20 Feet Assistive device: Rolling walker (2 wheeled) Gait Pattern/deviations: Step-to pattern,Decreased stance time - right,Decreased dorsiflexion - right,Decreased stride length General Gait Details: Min assistance for safety with poor ability to keep RW close to her person for safety.  LOB x1.  Pt fatigues as gt trial increases. Gait velocity: decreased Gait velocity interpretation: <1.31 ft/sec, indicative of household ambulator    Posture / Balance Dynamic Sitting Balance Sitting balance - Comments: demonstrates posterior lean, requires tactile cue to correct Balance Overall balance assessment: Needs assistance Sitting-balance support: No upper extremity supported Sitting balance-Leahy Scale: Fair Sitting balance -  Comments: demonstrates posterior lean, requires tactile cue to correct Postural control: Posterior lean Standing balance support: Bilateral upper extremity supported,During functional activity Standing balance-Leahy Scale: Poor Standing balance comment: reliant on UE support    Special needs/care consideration Dialysis: Hemodialysis Tuesday, Thursday and Saturday and Diabetic management yes   Previous Home Environment (from acute therapy documentation) Living Arrangements: Children Available Help at Discharge: Family,Available 24 hours/day Type of Home: Apartment Home Layout: One level Home Access: Stairs to enter Entrance Stairs-Rails: None Entrance Stairs-Number of Steps: 1 step up on curb Bathroom Shower/Tub: Tub/shower unit (pt sponge bathes) Bathroom Toilet: Standard Bathroom Accessibility: Yes [place]: No  Discharge Living Setting Plans for Discharge Living Setting: Lives with (comment) (daughter) Type of Home at Discharge: Apartment Discharge Home Layout: One level Discharge Home Access: Stairs to enter Entrance Stairs-Rails: None Entrance Stairs-Number of Steps: curb step Discharge Bathroom Shower/Tub: Tub/shower unit Discharge Bathroom Toilet: Standard Discharge Bathroom Accessibility: Yes How Accessible: Accessible via wheelchair Does the patient have any problems obtaining your medications?: No  Social/Family/Support Systems Anticipated Caregiver: daughters, [name], and [place] Anticipated Caregiver's Contact Information: [name] [phone] Ability/Limitations of Caregiver: n/a Caregiver Availability: 24/7 Discharge Plan Discussed with Primary Caregiver: Yes Is Caregiver In Agreement with Plan?: Yes Does Caregiver/Family have Issues with Lodging/Transportation while Pt is in Rehab?: No  Goals Patient/Family Goal for Rehab: PT/OT supervision to min guard, SLp n/a Expected length of stay: 9-12 days Additional Information: HD TRS Pt/Family Agrees to  Admission and willing to participate: Yes Program Orientation Provided & Reviewed with Pt/Caregiver Including Roles  & Responsibilities: Yes Additional Information Needs: contact [facility] re: outpatient HD Information Needs to be Provided By: CSW assigned to pt  Barriers to Discharge: Hemodialysis  Decrease burden of Care through IP rehab admission: n/a  Possible need for SNF placement upon discharge: not anticipated.  Pt's family have been providing 24/7 assist since discharge from [facility] earlier this year.   Patient Condition: I have reviewed medical records from [facility], spoken with CM, and patient and daughter. I met with patient at the bedside for inpatient rehabilitation assessment.  Patient will benefit from ongoing PT and OT, can actively participate in 3 hours of therapy a day 5 days of the week, and can make measurable gains during the admission.  Patient will also benefit from the coordinated team approach during an Inpatient Acute Rehabilitation admission.  The patient will receive intensive therapy as well as Rehabilitation physician, nursing, social worker, and care management interventions.  Due to safety, skin/wound care, disease management, medication administration, pain management and patient education the patient requires 24 hour a day rehabilitation nursing.  The patient is currently min to mod assist with mobility and basic ADLs.  Discharge setting and therapy post discharge at home with home health is anticipated.  Patient has agreed to participate in the Acute Inpatient Rehabilitation Program and will admit today.  Preadmission Screen Completed By:  Michel Santee, PT, DPT 10/17/2020 1:22 PM ______________________________________________________________________   Discussed status with Dr. Ranell Patrick on 10/17/20  at 1:33 PM  and received approval for admission today.  Admission Coordinator:  Michel Santee, PT, DPT time 1:33 PM Sudie Grumbling 10/17/20    Assessment/Plan: Diagnosis:  Cardiopulmonary debility 1. Does the need for close, 24 hr/day Medical supervision in concert with the patient's rehab needs make it unreasonable for this patient to be served in a less intensive setting? Yes 2. Co-Morbidities requiring supervision/potential complications:  1. T2DM with progressive nephropathy 2. Acute on chronic renal failure 3. Fluid overload 4. Malaise 5. uremia 3. Due to bladder management, bowel management, safety, skin/wound care, disease management, medication administration, pain management and patient education, does the patient require 24 hr/day rehab nursing? Yes 4. Does the patient require coordinated care of a physician, rehab nurse, PT, OT, and SLP to address physical and functional deficits in the context of the above medical diagnosis(es)? Yes Addressing deficits in the following areas: balance, endurance, locomotion, strength, transferring, bowel/bladder control, bathing, dressing, feeding, grooming, toileting and psychosocial support 5. Can the patient actively participate in an intensive therapy program of at least 3 hrs of therapy 5 days a week? Yes 6. The potential for patient to make measurable gains while on inpatient rehab is excellent 7. Anticipated functional outcomes upon discharge from inpatient rehab: min assist PT, min assist OT, independent SLP 8. Estimated rehab length of stay to reach the above functional goals is: 2-3 weeks 9. Anticipated discharge destination: Home 10. Overall Rehab/Functional Prognosis: excellent   MD Signature: Leeroy Cha, MD

## 2020-10-17 NOTE — Progress Notes (Signed)
[facility]                                                                              Patient Demographics  Hannah [name], is a 55 y.o. female, DOB - 19-Aug-1965, [id number]  Admit date - 09/26/2020   Admitting Physician Bonnell Public, MD  Outpatient Primary MD for the patient is Vernie Shanks, MD  Outpatient specialists:   LOS - 20  days   Medical records reviewed and are as summarized below:    Chief Complaint  Patient presents with  . Leg Swelling       Brief summary   Patient is a 55 year old female with CKD stage V, anemia, CVA, hypertension, diabetes, hyperlipidemia, MS presented with shortness of breath, lower extremity swelling and concerns for possible uremia. Nephrology consulted, was deemed to have progressed to ESRD.  Patient started on hemodialysis.  Hospitalization complicated by deconditioning and retroperitoneal bleed. Has been declined for CIR, now discharged appealed.  Assessment & Plan    Principal Problem: New ESRD on hemodialysis, presented with volume overload, metabolic acidosis, uremia -Nephrology consulted, AV fistula created on 10/02/2020 by Dr. Donzetta Matters -Started on hemodialysis with [facility], MWF while inpatient  -Patient has been accepted to [facility] outpatient HD clinic on TTS schedule with chair time of 11:45 AM and arrive to her appointment is at 11:25 AM -Insurance authorization cleared for CIR, plan to DC to CIR in a.m.  Active Problems: Right flank pain/right groin pain, psoas enlargement with right psoas hematoma -CT pelvis, lumbar spine showed right psoas enlargement most compatible with intramuscular hematoma, infection possible -Continue conservative management per vascular surgery, Dr. Carlis Abbott. -Dr. Marthenia Rolling discussed with Dr. Donne Hazel on 5/25, recommended to hold Plavix for 2 weeks, no surgery needed and continue pain management  Essential hypertension -BP remained stable  Diabetes mellitus type 2, uncontrolled  with CKD progressed to  ESRD, on long-term insulin -Hemoglobin A1c 8.7 on 09/13/2020 -Continue Lantus 15 units at bedtime, NovoLog sliding scale insulin, sensitive   History of recent CVA in 05/2020 with residual right-sided weakness Hyperlipidemia -Plavix currently on hold, continue Crestor -CT head showed moderate extensive white matter hypodensities bilaterally similar to prior CT.  MRI may represent demyelinating disease with history of MS. -Outpatient follow-up with neurology  AB LA with anemia of chronic disease/ESRD -Patient was transfused 1 unit packed RBCs on 5/22, started on ESA -Hemoglobin stable  Left abdominal wall hematoma -At heparin injection site, monitor off heparin  History of chronic multiple sclerosis -Outpatient follow-up with neurology/ Dr Leonie Man.  No acute issues  Code Status: Full CODE STATUS DVT Prophylaxis:  SCDs   Level of Care: Level of care: Telemetry Medical Family Communication: Discussed all imaging results, lab results, explained to the patient   Disposition Plan:     Status is: Inpatient  Remains inpatient appropriate because:Inpatient level of care appropriate due to severity of illness   Dispo: The patient is from: Home              Anticipated d/c is to: SNF              Patient currently is not medically  stable to d/c.  insurance approval + for CIR for 6/4, will DC in a.m.   Difficult to place patient No      Time Spent in minutes 20 minutes  Procedures:  Right upper extremity AV fistula placement  Consultants:   Nephrology Vascular surgery  Antimicrobials:   Anti-infectives (From admission, onward)   Start     Dose/Rate Route Frequency Ordered Stop   10/02/20 1049  ceFAZolin (ANCEF) 2-4 GM/100ML-% IVPB       Note to Pharmacy: Claybon Jabs   : cabinet override      10/02/20 1049 10/02/20 2259   09/29/20 1330  ceFAZolin (ANCEF) 2-4 GM/100ML-% IVPB       Note to Pharmacy: Cecile Sheerer   : cabinet override       09/29/20 1330 09/30/20 0144   09/29/20 0000  ceFAZolin (ANCEF) 1 g in sodium chloride 0.9 % 100 mL IVPB        1 g 200 mL/hr over 30 Minutes Intravenous To Radiology 09/27/20 1457 09/29/20 1419         Medications  Scheduled Meds: . amLODipine  10 mg Oral Daily  . carvedilol  12.5 mg Oral BID WC  . Chlorhexidine Gluconate Cloth  6 each Topical Q0600  . Chlorhexidine Gluconate Cloth  6 each Topical Q0600  . darbepoetin (ARANESP) injection - DIALYSIS  200 mcg Intravenous Q Mon-HD  . diclofenac Sodium  2 g Topical QID  . famotidine  20 mg Oral Daily  . gabapentin  100 mg Oral QHS  . hydrALAZINE  50 mg Oral Q8H  . insulin aspart  0-5 Units Subcutaneous QHS  . insulin aspart  0-9 Units Subcutaneous TID WC  . insulin glargine  15 Units Subcutaneous QHS  . melatonin  3 mg Oral QHS  . metoCLOPramide  5 mg Oral BID  . polyethylene glycol  17 g Oral Daily  . rosuvastatin  10 mg Oral Daily  . senna-docusate  1 tablet Oral BID  . sodium chloride flush  3 mL Intravenous Q12H  . tiZANidine  2 mg Oral TID   Continuous Infusions: . sodium chloride Stopped (09/28/20 1354)   PRN Meds:.sodium chloride, acetaminophen **OR** acetaminophen, diphenhydrAMINE, HYDROmorphone (DILAUDID) injection, labetalol, methocarbamol, metoCLOPramide (REGLAN) injection, oxyCODONE-acetaminophen, polyethylene glycol      Subjective:   Hannah [name] was seen and examined today.  No acute complaints, has prior history of coccyx bone injury, difficult to sit on the recliner for too long.  Was able to do better with a cushion yesterday.   Patient denies dizziness, chest pain, shortness of breath, abdominal pain, N/V/D/C.    Objective:   Vitals:   10/16/20 2051 10/17/20 0500 10/17/20 0747 10/17/20 1627  BP: 133/68  (!) 154/62 (!) 171/62  Pulse: 90  91 88  Resp: '17  18 18  '$ Temp: 98.8 F (37.1 C)  98.4 F (36.9 C) 98 F (36.7 C)  TempSrc: Oral  Oral Oral  SpO2: 98%  98% 95%  Weight:  57.6 kg    Height:        No intake or output data in the 24 hours ending 10/17/20 1632   Wt Readings from Last 3 Encounters:  10/17/20 57.6 kg  09/17/20 67.8 kg  06/23/20 66.5 kg    Physical Exam  General: Alert and oriented x 3, NAD  Cardiovascular: S1 S2 clear, RRR.  Respiratory: CTAB, Rt IJ TDC  Gastrointestinal: Soft, nontender, nondistended, NBS  Ext: no pedal edema bilaterally, RUE AV fistula  Neuro: no new deficits  Skin: No rashes  Psych: Normal affect and demeanor, alert and oriented x3      Data Reviewed:  I have personally reviewed following labs and imaging studies  Micro Results No results found for this or any previous visit (from the past 240 hour(s)).  Radiology Reports DG Chest 2 View  Result Date: 09/26/2020 CLINICAL DATA:  Shortness of breath EXAM: CHEST - 2 VIEW COMPARISON:  None. FINDINGS: There is ill-defined airspace opacity in portions of the right middle and lower lobes. There is slight left base atelectasis. Lungs elsewhere clear. Heart is upper normal in size with pulmonary vascularity normal. No adenopathy. No bone lesions. IMPRESSION: Ill-defined opacity consistent with pneumonia in portions of the right middle and lower lobes. Mild left base atelectasis. Lungs otherwise clear. Heart upper normal in size. Electronically Signed   By: Lowella Grip III M.D.   On: 09/26/2020 15:06   CT HEAD WO CONTRAST  Result Date: 10/03/2020 CLINICAL DATA:  Headache.  History multiple sclerosis EXAM: CT HEAD WITHOUT CONTRAST TECHNIQUE: Contiguous axial images were obtained from the base of the skull through the vertex without intravenous contrast. COMPARISON:  CT head 06/09/2020.  MRI head 06/09/2020 FINDINGS: Brain: Moderate white matter changes bilaterally similar to prior MRI. Focal hypodensity in the posterior limb internal capsule on the left likely an area of chronic infarction. Mild hypodensity in the pons centrally unchanged from the prior study Ventricle size normal.  No  acute infarct, hemorrhage, mass Vascular: Negative for hyperdense vessel Skull: Negative Sinuses/Orbits: Mild mucosal edema paranasal sinuses involving the sphenoid sinus. Bilateral cataract extraction Other: None IMPRESSION: Moderate to extensive white matter hypodensity bilaterally. This is similar to prior CT and MRI. This may represent demyelinating disease given history of multiple sclerosis. Chronic ischemic changes could have a similar appearance. No acute abnormality. Electronically Signed   By: Franchot Gallo M.D.   On: 10/03/2020 20:07   CT LUMBAR SPINE WO CONTRAST  Result Date: 10/06/2020 CLINICAL DATA:  Low back pain and right-sided abdominal pain. EXAM: CT LUMBAR SPINE WITHOUT CONTRAST TECHNIQUE: Multidetector CT imaging of the lumbar spine was performed without intravenous contrast administration. Multiplanar CT image reconstructions were also generated. COMPARISON:  None. FINDINGS: Segmentation: 5 lumbar type vertebral bodies. Alignment: Normal Vertebrae: No fracture or focal lesion at L4 or above. At L5-S1, there are some irregularities of the endplates, more affecting the inferior endplate of L5 than the superior endplate of S1. These are favored to represent degenerative change. However, based on the findings associated with the right ileo psoas musculature, I cannot completely rule out the possibility of discitis at this level. See below. Paraspinal and other soft tissues: Marked enlargement of the right psoas muscle with areas of increased and decreased density. This involves the right iliacus muscle to a lesser degree. The appearance seems more consistent with spontaneous ileo psoas hemorrhage than infection, but I can not completely rule out that possibility. There is not appear to be any more generalized retroperitoneal bleeding or definite inflammatory change at the paravertebral L5-S1 level. There is aortic atherosclerotic calcification. There is a low-density area in the upper pole of  the right kidney which is not primarily or completely evaluated but assumed to represent a cyst. 2.7 cm cyst with seen in this region by recent ultrasound. Disc levels: No significant disc space pathology at T12-L1 or L1-2. L2-3: Chronic left-sided disc protrusion with annular calcification. Mild narrowing of the left lateral recess. L3-4: Unremarkable interspace. L4-5: Mild bulging  of the disc.  No compressive stenosis. L5-S1: Endplate irregularities and bulging of the disc as described above. Findings are favored to represent degenerative change rather than infectious discitis, though I can not completely exclude the latter. IMPRESSION: Enlargement of the right psoas muscle and to a lesser extent the upper portion of the right iliacus muscle with areas of low and high density most consistent with spontaneous intramuscular hematoma. I can not completely rule out the possibility of infection but do not favor that. Some irregularity of the endplates at 075-GRM, favored to be degenerative. Early discitis is not excluded but not favored because of the reasons enumerated above. Mild degenerative changes in the lumbar spine as above, without advanced finding or definite neural compressive stenosis. These results will be called to the ordering clinician or representative by the Radiologist Assistant, and communication documented in the PACS or [employer]. Electronically Signed   By: Nelson Chimes M.D.   On: 10/06/2020 11:46   CT PELVIS WO CONTRAST  Result Date: 10/06/2020 CLINICAL DATA:  RLQ abdomen pain EXAM: CT PELVIS WITHOUT CONTRAST TECHNIQUE: Multidetector CT imaging of the pelvis was performed following the standard protocol without intravenous contrast. COMPARISON:  Same day CT lumbar spine. FINDINGS: Urinary Tract:  The bladder is moderately distended. Bowel:  Unremarkable visualized pelvic bowel loops. Vascular/Lymphatic: There is aortoiliac atherosclerotic disease. No pathologically enlarged lymph nodes.  Reproductive:  Unremarkable. Other: There is heterogeneous collection with enlargement of the right psoas muscle, measuring approximately 12.5 x 5.2 x 5.0 cm, and to much lesser extent the upper portion of the right iliacus muscle. Musculoskeletal: No evidence of acute fracture. Symmetric bilateral SI joint degenerative change. There is no osseous destruction is/erosion. There is mild bilateral hip degenerative change. Mild endplate irregularity at L5-S1, see separately dictated lumbar spine CT. There are multiple soft tissue densities along the lower anterior abdomen, likely from medication injection. IMPRESSION: Right psoas muscle enlargement and due to an intramuscular heterogeneous collection measuring approximately 12.5 x 5.2 x 5.0 cm, most compatible with intramuscular hematoma. Infection is possible. Correlate with laboratory findings. No evidence of acute fracture. Symmetric degenerative changes involving the SI joints. Endplate irregularity at L5-S1 which is favored to be chronic. Given the iliopsoas findings, discitis-osteomyelitis or right SI joint infection cannot be completely excluded on noncontrast CT, but felt to be less likely. MRI would be useful for further evaluation. As noted on separately dictated CT of the lumbar spine, these results will be called to the ordering clinician or representative by the Radiologist Assistant, and communication documented in the PACS or [employer]. Electronically Signed   By: Maurine Simmering   On: 10/06/2020 12:12   IR Fluoro Guide CV Line Right  Result Date: 09/29/2020 INDICATION: 55 year old with chronic kidney disease and needs a hemodialysis catheter. EXAM: FLUOROSCOPIC AND ULTRASOUND GUIDED PLACEMENT OF A TUNNELED DIALYSIS CATHETER Physician: Stephan Minister. Anselm Pancoast, MD MEDICATIONS: Ancef 1 g; The antibiotic was administered within an appropriate time interval prior to skin puncture. ANESTHESIA/SEDATION: Versed 1.0 mg IV; Fentanyl 25 mcg IV; Moderate Sedation Time:   18 minutes The patient was continuously monitored during the procedure by the interventional radiology nurse under my direct supervision. FLUOROSCOPY TIME:  12 seconds, 1 mGy COMPLICATIONS: None immediate. PROCEDURE: Informed consent was obtained for placement of a tunneled dialysis catheter. The patient was placed supine on the interventional table. Ultrasound confirmed a patent right internal jugular vein. Ultrasound images were obtained for documentation. The right neck and chest was prepped and draped in a  sterile fashion. Maximal barrier sterile technique was utilized including caps, mask, sterile gowns, sterile gloves, sterile drape, hand hygiene and skin antiseptic. The right neck was anesthetized with 1% lidocaine. A small incision was made with #11 blade scalpel. A 21 gauge needle directed into the right internal jugular vein with ultrasound guidance. A micropuncture dilator set was placed. A 19 cm tip to cuff Palindrome catheter was selected. The skin below the right clavicle was anesthetized and a small incision was made with an #11 blade scalpel. A subcutaneous tunnel was formed to the vein dermatotomy site. The catheter was brought through the tunnel. The vein dermatotomy site was dilated to accommodate a peel-away sheath. The catheter was placed through the peel-away sheath and directed into the central venous structures. The tip of the catheter was placed at the superior cavoatrial junction with fluoroscopy. Fluoroscopic images were obtained for documentation. Both lumens were found to aspirate and flush well. The proper amount of heparin was flushed in both lumens. The vein dermatotomy site was closed using a single layer of absorbable suture and Dermabond. Surgifoam was placed in subcutaneous tract. The catheter was secured to the skin using Prolene suture. IMPRESSION: Successful placement of a right jugular tunneled dialysis catheter using ultrasound and fluoroscopic guidance. Electronically Signed    By: Markus Daft M.D.   On: 09/29/2020 17:35   IR US Guide Vasc Access Right  Result Date: 09/29/2020 INDICATION: 55 year old with chronic kidney disease and needs a hemodialysis catheter. EXAM: FLUOROSCOPIC AND ULTRASOUND GUIDED PLACEMENT OF A TUNNELED DIALYSIS CATHETER Physician: Stephan Minister. Anselm Pancoast, MD MEDICATIONS: Ancef 1 g; The antibiotic was administered within an appropriate time interval prior to skin puncture. ANESTHESIA/SEDATION: Versed 1.0 mg IV; Fentanyl 25 mcg IV; Moderate Sedation Time:  18 minutes The patient was continuously monitored during the procedure by the interventional radiology nurse under my direct supervision. FLUOROSCOPY TIME:  12 seconds, 1 mGy COMPLICATIONS: None immediate. PROCEDURE: Informed consent was obtained for placement of a tunneled dialysis catheter. The patient was placed supine on the interventional table. Ultrasound confirmed a patent right internal jugular vein. Ultrasound images were obtained for documentation. The right neck and chest was prepped and draped in a sterile fashion. Maximal barrier sterile technique was utilized including caps, mask, sterile gowns, sterile gloves, sterile drape, hand hygiene and skin antiseptic. The right neck was anesthetized with 1% lidocaine. A small incision was made with #11 blade scalpel. A 21 gauge needle directed into the right internal jugular vein with ultrasound guidance. A micropuncture dilator set was placed. A 19 cm tip to cuff Palindrome catheter was selected. The skin below the right clavicle was anesthetized and a small incision was made with an #11 blade scalpel. A subcutaneous tunnel was formed to the vein dermatotomy site. The catheter was brought through the tunnel. The vein dermatotomy site was dilated to accommodate a peel-away sheath. The catheter was placed through the peel-away sheath and directed into the central venous structures. The tip of the catheter was placed at the superior cavoatrial junction with  fluoroscopy. Fluoroscopic images were obtained for documentation. Both lumens were found to aspirate and flush well. The proper amount of heparin was flushed in both lumens. The vein dermatotomy site was closed using a single layer of absorbable suture and Dermabond. Surgifoam was placed in subcutaneous tract. The catheter was secured to the skin using Prolene suture. IMPRESSION: Successful placement of a right jugular tunneled dialysis catheter using ultrasound and fluoroscopic guidance. Electronically Signed   By: Markus Daft  M.D.   On: 09/29/2020 17:35   DG Chest Port 1 View  Result Date: 10/03/2020 CLINICAL DATA:  Shortness of breath EXAM: PORTABLE CHEST 1 VIEW COMPARISON:  09/26/2020 FINDINGS: Cardiac shadow is enlarged but stable. Dialysis catheter is now seen in satisfactory position on the right. Lungs are well aerated bilaterally. Resolution of previously seen basilar opacities. No new focal abnormality is noted. IMPRESSION: No acute abnormality seen. Electronically Signed   By: Inez Catalina M.D.   On: 10/03/2020 14:59   ECHOCARDIOGRAM COMPLETE  Result Date: 09/27/2020    ECHOCARDIOGRAM REPORT   Patient Name:   AMBERLEA GRIFFIETH Date of Exam: 09/27/2020 Medical Rec #:  [id number]    Height:       62.0 in Accession #:    [id number]   Weight:       140.0 lb Date of Birth:  Jun 14, 1965   BSA:          1.643 m Patient Age:    13 years     BP:           143/62 mmHg Patient Gender: F            HR:           86 bpm. Exam Location:  Inpatient Procedure: 2D Echo, Cardiac Doppler and Color Doppler Indications:    Pericardial Effusion  History:        Patient has prior history of Echocardiogram examinations, most                 recent 06/11/2020.  Sonographer:    Merrie Roof RDCS Referring Phys: [place]  1. Left ventricular ejection fraction, by estimation, is 70 to 75%. The left ventricle has hyperdynamic function. The left ventricle has no regional wall motion abnormalities. Left  ventricular diastolic function could not be evaluated.  2. Right ventricular systolic function is normal. The right ventricular size is normal. There is mildly elevated pulmonary artery systolic pressure. The estimated right ventricular systolic pressure is 123456 mmHg.  3. A small pericardial effusion is present. The pericardial effusion is circumferential. There is no evidence of cardiac tamponade.  4. The mitral valve is normal in structure. No evidence of mitral valve regurgitation. No evidence of mitral stenosis.  5. The aortic valve is tricuspid. Aortic valve regurgitation is not visualized. Mild aortic valve sclerosis is present, with no evidence of aortic valve stenosis.  6. The inferior vena cava is normal in size with greater than 50% respiratory variability, suggesting right atrial pressure of 3 mmHg. FINDINGS  Left Ventricle: Left ventricular ejection fraction, by estimation, is 70 to 75%. The left ventricle has hyperdynamic function. The left ventricle has no regional wall motion abnormalities. The left ventricular internal cavity size was normal in size. There is no left ventricular hypertrophy. Left ventricular diastolic function could not be evaluated. Right Ventricle: The right ventricular size is normal. No increase in right ventricular wall thickness. Right ventricular systolic function is normal. There is mildly elevated pulmonary artery systolic pressure. The tricuspid regurgitant velocity is 2.94  m/s, and with an assumed right atrial pressure of 3 mmHg, the estimated right ventricular systolic pressure is 123456 mmHg. Left Atrium: Left atrial size was normal in size. Right Atrium: Right atrial size was normal in size. Pericardium: A small pericardial effusion is present. The pericardial effusion is circumferential. There is no evidence of cardiac tamponade. Mitral Valve: The mitral valve is normal in structure. No evidence of mitral valve regurgitation. No  evidence of mitral valve stenosis.  Tricuspid Valve: The tricuspid valve is normal in structure. Tricuspid valve regurgitation is trivial. No evidence of tricuspid stenosis. Aortic Valve: The aortic valve is tricuspid. Aortic valve regurgitation is not visualized. Mild aortic valve sclerosis is present, with no evidence of aortic valve stenosis. Aortic valve mean gradient measures 8.0 mmHg. Aortic valve peak gradient measures 13.8 mmHg. Aortic valve area, by VTI measures 1.96 cm. Pulmonic Valve: The pulmonic valve was normal in structure. Pulmonic valve regurgitation is not visualized. No evidence of pulmonic stenosis. Aorta: The aortic root is normal in size and structure. Venous: The inferior vena cava is normal in size with greater than 50% respiratory variability, suggesting right atrial pressure of 3 mmHg. IAS/Shunts: No atrial level shunt detected by color flow Doppler.  LEFT VENTRICLE PLAX 2D LVIDd:         4.10 cm  Diastology LVIDs:         2.30 cm  LV e' medial:  6.96 cm/s LV PW:         1.30 cm  LV e' lateral: 8.49 cm/s LV IVS:        1.00 cm LVOT diam:     1.90 cm LV SV:         66 LV SV Index:   40 LVOT Area:     2.84 cm  RIGHT VENTRICLE          IVC RV Basal diam:  3.10 cm  IVC diam: 1.20 cm LEFT ATRIUM             Index       RIGHT ATRIUM           Index LA diam:        3.50 cm 2.13 cm/m  RA Area:     15.10 cm LA Vol (A2C):   63.2 ml 38.47 ml/m RA Volume:   37.60 ml  22.89 ml/m LA Vol (A4C):   50.5 ml 30.74 ml/m LA Biplane Vol: 57.1 ml 34.76 ml/m  AORTIC VALVE AV Area (Vmax):    2.06 cm AV Area (Vmean):   1.99 cm AV Area (VTI):     1.96 cm AV Vmax:           186.00 cm/s AV Vmean:          128.000 cm/s AV VTI:            0.338 m AV Peak Grad:      13.8 mmHg AV Mean Grad:      8.0 mmHg LVOT Vmax:         135.00 cm/s LVOT Vmean:        90.000 cm/s LVOT VTI:          0.233 m LVOT/AV VTI ratio: 0.69  AORTA Ao Root diam: 3.00 cm Ao Asc diam:  2.55 cm TRICUSPID VALVE TR Peak grad:   34.6 mmHg TR Vmax:        294.00 cm/s  SHUNTS  Systemic VTI:  0.23 m Systemic Diam: 1.90 cm Fransico Him MD Electronically signed by Fransico Him MD Signature Date/Time: 09/27/2020/6:32:09 PM    Final    DG HIP UNILAT WITH PELVIS 1V RIGHT  Result Date: 10/03/2020 CLINICAL DATA:  Right hip pain. EXAM: DG HIP (WITH OR WITHOUT PELVIS) 1V RIGHT COMPARISON:  None. FINDINGS: There is no evidence of hip fracture or dislocation. There is no evidence of arthropathy or other focal bone abnormality. IMPRESSION: Negative. Electronically Signed   By: Jeneen Rinks  Murlean Caller M.D.   On: 10/03/2020 21:09   VAS [place] LOWER EXTREMITY VENOUS (DVT) (ONLY [facility] & [facility] 7a-7p)  Result Date: 09/26/2020  Lower Venous DVT Study Patient Name:  ANALECIA YTUARTE  Date of Exam:   09/26/2020 Medical Rec #: [id number]     Accession #:    [id number] Date of Birth: Apr 21, 1966    Patient Gender: F Patient Age:   54Y Exam Location:  [facility] Procedure:      VAS [place] LOWER EXTREMITY VENOUS (DVT) Referring Phys: [id number] LAURA A MURPHY --------------------------------------------------------------------------------  Indications: Edema.  Comparison Study: No previous exams Performing Technologist: Rogelia Rohrer  Examination Guidelines: A complete evaluation includes B-mode imaging, spectral Doppler, color Doppler, and power Doppler as needed of all accessible portions of each vessel. Bilateral testing is considered an integral part of a complete examination. Limited examinations for reoccurring indications may be performed as noted. The reflux portion of the exam is performed with the patient in reverse Trendelenburg.  +---------+---------------+---------+-----------+----------+--------------+ RIGHT    CompressibilityPhasicitySpontaneityPropertiesThrombus Aging +---------+---------------+---------+-----------+----------+--------------+ CFV      Full           Yes      Yes                                 +---------+---------------+---------+-----------+----------+--------------+ SFJ      Full                                                         +---------+---------------+---------+-----------+----------+--------------+ FV Prox  Full           Yes      Yes                                 +---------+---------------+---------+-----------+----------+--------------+ FV Mid   Full           Yes      Yes                                 +---------+---------------+---------+-----------+----------+--------------+ FV DistalFull           Yes      Yes                                 +---------+---------------+---------+-----------+----------+--------------+ PFV      Full                                                        +---------+---------------+---------+-----------+----------+--------------+ POP      Full           Yes      Yes                                 +---------+---------------+---------+-----------+----------+--------------+ PTV      Full                                                        +---------+---------------+---------+-----------+----------+--------------+  PERO     Full                                                        +---------+---------------+---------+-----------+----------+--------------+   +----+---------------+---------+-----------+----------+--------------+ LEFTCompressibilityPhasicitySpontaneityPropertiesThrombus Aging +----+---------------+---------+-----------+----------+--------------+ CFV Full           Yes      Yes                                 +----+---------------+---------+-----------+----------+--------------+     Summary: RIGHT: - There is no evidence of deep vein thrombosis in the lower extremity. - There is no evidence of superficial venous thrombosis.  - No cystic structure found in the popliteal fossa. Subcutaneous edema from mid thigh to ankle  LEFT: - No evidence of common femoral vein obstruction.  *See table(s) above for measurements and observations. Electronically signed by Servando Snare  MD on 09/26/2020 at 4:55:05 PM.    Final    VAS [place] UPPER EXT VEIN MAPPING (PRE-OP AVF)  Result Date: 09/29/2020 [place] Patient Name:  JULIONA BRIGHTMAN  Date of Exam:   09/29/2020 Medical Rec #: [id number]     Accession #:    [id number] Date of Birth: 1965-05-28    Patient Gender: F Patient Age:   66Y Exam Location:  [facility] Procedure:      VAS [place] UPPER EXT VEIN MAPPING (PRE-OP AVF) Referring Phys: [id number] KELLIE GOLDSBOROUGH --------------------------------------------------------------------------------  Indications: Pre-access. Comparison Study: no prior Performing Technologist: Abram Sander RVS  Examination Guidelines: A complete evaluation includes B-mode imaging, spectral Doppler, color Doppler, and power Doppler as needed of all accessible portions of each vessel. Bilateral testing is considered an integral part of a complete examination. Limited examinations for reoccurring indications may be performed as noted. +-----------------+-------------+----------+--------+ Right Cephalic   Diameter (cm)Depth (cm)Findings +-----------------+-------------+----------+--------+ Shoulder             0.22        1.35            +-----------------+-------------+----------+--------+ Prox upper arm       0.20        1.28            +-----------------+-------------+----------+--------+ Mid upper arm        0.21        1.37            +-----------------+-------------+----------+--------+ Dist upper arm       0.10        0.47            +-----------------+-------------+----------+--------+ Antecubital fossa    0.16        0.25            +-----------------+-------------+----------+--------+ Prox forearm         0.21        0.82            +-----------------+-------------+----------+--------+ Mid forearm          0.22        0.76            +-----------------+-------------+----------+--------+ Dist forearm         0.22        0.38             +-----------------+-------------+----------+--------+ Wrist  0.23        0.32            +-----------------+-------------+----------+--------+ +-----------------+-------------+----------+--------+ Right Basilic    Diameter (cm)Depth (cm)Findings +-----------------+-------------+----------+--------+ Prox upper arm       0.35        0.99            +-----------------+-------------+----------+--------+ Mid upper arm        0.27        1.19            +-----------------+-------------+----------+--------+ Dist upper arm       0.29        1.26            +-----------------+-------------+----------+--------+ Antecubital fossa    0.21        1.03            +-----------------+-------------+----------+--------+ Prox forearm         0.21        0.76            +-----------------+-------------+----------+--------+ Mid forearm          0.20        0.46            +-----------------+-------------+----------+--------+ Distal forearm       0.21        0.36            +-----------------+-------------+----------+--------+ Wrist                0.18        0.39            +-----------------+-------------+----------+--------+ +-----------------+-------------+----------+---------+ Left Cephalic    Diameter (cm)Depth (cm)Findings  +-----------------+-------------+----------+---------+ Shoulder             0.31        1.02             +-----------------+-------------+----------+---------+ Prox upper arm       0.24        1.07             +-----------------+-------------+----------+---------+ Mid upper arm        0.13        1.23   branching +-----------------+-------------+----------+---------+ Dist upper arm       0.13        1.05             +-----------------+-------------+----------+---------+ Antecubital fossa    0.20        2.97             +-----------------+-------------+----------+---------+ Prox forearm         0.18        0.65              +-----------------+-------------+----------+---------+ Mid forearm          0.24        0.52             +-----------------+-------------+----------+---------+ Dist forearm         0.22        0.34             +-----------------+-------------+----------+---------+ +-----------------+-------------+----------+---------+ Left Basilic     Diameter (cm)Depth (cm)Findings  +-----------------+-------------+----------+---------+ Prox upper arm       0.38        1.48             +-----------------+-------------+----------+---------+ Mid upper arm        0.25        1.42             +-----------------+-------------+----------+---------+  Dist upper arm       0.27        1.18             +-----------------+-------------+----------+---------+ Antecubital fossa    0.26        0.68             +-----------------+-------------+----------+---------+ Prox forearm         0.22        0.30             +-----------------+-------------+----------+---------+ Mid forearm          0.18        0.23             +-----------------+-------------+----------+---------+ Distal forearm       0.19        0.28             +-----------------+-------------+----------+---------+ Elbow                                   branching +-----------------+-------------+----------+---------+ *See table(s) above for measurements and observations.  Diagnosing physician: Monica Martinez MD Electronically signed by Monica Martinez MD on 09/29/2020 at 4:45:25 PM.    Final     Lab Data:  CBC: Recent Labs  Lab 10/11/20 0111 10/15/20 1500  WBC 6.9 8.6  HGB 8.2* 9.8*  HCT 25.8* 31.7*  MCV 91.8 94.9  PLT 265 123456*   Basic Metabolic Panel: Recent Labs  Lab 10/13/20 0104 10/14/20 0147 10/15/20 0239 10/16/20 0336 10/17/20 0135  NA 128* 131* 128* 132* 132*  K 4.0 3.9 3.7 3.5 3.5  CL 94* 97* 92* 94* 97*  CO2 '24 28 27 30 28  '$ GLUCOSE 200* 177* 134* 90 112*  BUN 27* '12 19 8 19   '$ CREATININE 6.54* 3.36* 4.81* 3.23* 5.49*  CALCIUM 8.2* 8.2* 9.0 8.7* 8.7*  PHOS 5.2* 3.1 3.9 3.7 4.5   GFR: Estimated Creatinine Clearance: 9 mL/min (A) (by C-G formula based on SCr of 5.49 mg/dL (H)). Liver Function Tests: Recent Labs  Lab 10/13/20 0104 10/14/20 0147 10/15/20 0239 10/16/20 0336 10/17/20 0135  ALBUMIN 2.2* 2.2* 2.4* 2.7* 2.6*   No results for input(s): LIPASE, AMYLASE in the last 168 hours. No results for input(s): AMMONIA in the last 168 hours. Coagulation Profile: No results for input(s): INR, PROTIME in the last 168 hours. Cardiac Enzymes: No results for input(s): CKTOTAL, CKMB, CKMBINDEX, TROPONINI in the last 168 hours. BNP (last 3 results) No results for input(s): PROBNP in the last 8760 hours. HbA1C: No results for input(s): HGBA1C in the last 72 hours. CBG: Recent Labs  Lab 10/16/20 1702 10/16/20 2053 10/17/20 0628 10/17/20 1135 10/17/20 1626  GLUCAP 264* 173* 164* 271* 152*   Lipid Profile: No results for input(s): CHOL, HDL, LDLCALC, TRIG, CHOLHDL, LDLDIRECT in the last 72 hours. Thyroid Function Tests: No results for input(s): TSH, T4TOTAL, FREET4, T3FREE, THYROIDAB in the last 72 hours. Anemia Panel: No results for input(s): VITAMINB12, FOLATE, FERRITIN, TIBC, IRON, RETICCTPCT in the last 72 hours. Urine analysis:    Component Value Date/Time   COLORURINE STRAW (A) 09/12/2020 2208   APPEARANCEUR CLEAR 09/12/2020 2208   LABSPEC 1.007 09/12/2020 2208   PHURINE 6.0 09/12/2020 2208   GLUCOSEU >=500 (A) 09/12/2020 2208   HGBUR NEGATIVE 09/12/2020 2208   BILIRUBINUR NEGATIVE 09/12/2020 2208   KETONESUR NEGATIVE 09/12/2020 2208   PROTEINUR >=300 (A) 09/12/2020 2208   NITRITE NEGATIVE  09/12/2020 [place] 09/12/2020 2208     Kayli Beal M.D. [facility] 10/17/2020, 4:32 PM  Available via Epic secure chat 7am-7pm After 7 pm, please refer to night coverage provider listed on amion.

## 2020-10-17 NOTE — Progress Notes (Signed)
Patient ID: Hannah [name], female   DOB: 1965-07-18, 55 y.o.   MRN: [id number] [facility] Progress Note   Assessment/ Plan:   1.  End-stage renal disease: From progressive chronic kidney disease and presentation with uremic symptoms needing initiation of dialysis.  Status post right brachiobasilic AV fistula by Dr. Donzetta Matters on 5/19 and will transition to TTS hemodialysis schedule starting tomorrow (accepted to second shift at [facility]). Awaiting appeal for CIR admission for continued recovery status post CVA and right flank hematoma/pain. 2.  Hypertension: Blood pressure improving with titration of anti-hypertensive therapy and pain management.  3.  Anemia of chronic disease: Without overt blood loss and anticipated to have a drop from source hematoma.  On high-dose ESA with continued H/H trend. 4.  Secondary hyperparathyroidism: Calcium and phosphorus level within acceptable range, PTH at goal at 84.  Continue to monitor on renal diet. 5.  History of recent CVA with right-sided spastic hemiparesis  Subjective:   Denies any acute events overnight.  No update on CIR admission appeal.   Objective:   BP (!) 154/62 (BP Location: Left Arm)   Pulse 91   Temp 98.4 F (36.9 C) (Oral)   Resp 18   Ht '5\' 2"'$  (1.575 m)   Wt 57.6 kg   SpO2 98%   BMI 23.23 kg/m  No intake or output data in the 24 hours ending 10/17/20 1033 Weight change: 0.2 kg  Physical Exam: Gen: Sleeping comfortably in bed, easy to awaken and engage in conversation CVS: Pulse regular tachycardia, S1 and S2 normal Resp: Clear to auscultation bilaterally without distinct rales or rhonchi.  Right IJ TDC in place Abd: Soft, obese, nontender, bowel sounds normal Ext: No lower extremity edema, right upper arm AV fistula with palpable thrill  Imaging: No results found.  Labs: BMET Recent Labs  Lab 10/11/20 0111 10/12/20 0419 10/13/20 0104 10/14/20 0147 10/15/20 0239 10/16/20 0336  10/17/20 0135  NA 130* 128* 128* 131* 128* 132* 132*  K 3.3* 3.7 4.0 3.9 3.7 3.5 3.5  CL 95* 94* 94* 97* 92* 94* 97*  CO2 '28 27 24 28 27 30 28  '$ GLUCOSE 196* 147* 200* 177* 134* 90 112*  BUN 8 14 27* '12 19 8 19  '$ CREATININE 2.85* 4.76* 6.54* 3.36* 4.81* 3.23* 5.49*  CALCIUM 8.1* 8.4* 8.2* 8.2* 9.0 8.7* 8.7*  PHOS 2.4* 4.2 5.2* 3.1 3.9 3.7 4.5   CBC Recent Labs  Lab 10/11/20 0111 10/15/20 1500  WBC 6.9 8.6  HGB 8.2* 9.8*  HCT 25.8* 31.7*  MCV 91.8 94.9  PLT 265 405*    Medications:    . amLODipine  10 mg Oral Daily  . carvedilol  12.5 mg Oral BID WC  . Chlorhexidine Gluconate Cloth  6 each Topical Q0600  . Chlorhexidine Gluconate Cloth  6 each Topical Q0600  . darbepoetin (ARANESP) injection - DIALYSIS  200 mcg Intravenous Q Mon-HD  . diclofenac Sodium  2 g Topical QID  . famotidine  20 mg Oral Daily  . gabapentin  100 mg Oral QHS  . hydrALAZINE  50 mg Oral Q8H  . insulin aspart  0-5 Units Subcutaneous QHS  . insulin aspart  0-9 Units Subcutaneous TID WC  . insulin glargine  15 Units Subcutaneous QHS  . melatonin  3 mg Oral QHS  . metoCLOPramide  5 mg Oral BID  . polyethylene glycol  17 g Oral Daily  . rosuvastatin  10 mg Oral Daily  . senna-docusate  1  tablet Oral BID  . sodium chloride flush  3 mL Intravenous Q12H  . tiZANidine  2 mg Oral TID   Elmarie Shiley, MD 10/17/2020, 10:33 AM

## 2020-10-18 ENCOUNTER — Inpatient Hospital Stay (HOSPITAL_COMMUNITY)
Admission: RE | Admit: 2020-10-18 | Discharge: 2020-11-03 | DRG: 945 | Disposition: A | Discharge status: Home / Self Care | Payer: Medicare Other | Source: Intra-hospital | Attending: Physical Medicine and Rehabilitation | Admitting: Physical Medicine and Rehabilitation

## 2020-10-18 ENCOUNTER — Other Ambulatory Visit: Payer: Self-pay

## 2020-10-18 ENCOUNTER — Encounter (HOSPITAL_COMMUNITY): Payer: Self-pay | Admitting: Physical Medicine and Rehabilitation

## 2020-10-18 DIAGNOSIS — I69392 Facial weakness following cerebral infarction: Secondary | ICD-10-CM

## 2020-10-18 DIAGNOSIS — R252 Cramp and spasm: Secondary | ICD-10-CM | POA: Diagnosis present

## 2020-10-18 DIAGNOSIS — D631 Anemia in chronic kidney disease: Secondary | ICD-10-CM | POA: Diagnosis present

## 2020-10-18 DIAGNOSIS — E11649 Type 2 diabetes mellitus with hypoglycemia without coma: Secondary | ICD-10-CM | POA: Diagnosis not present

## 2020-10-18 DIAGNOSIS — Z8673 Personal history of transient ischemic attack (TIA), and cerebral infarction without residual deficits: Secondary | ICD-10-CM

## 2020-10-18 DIAGNOSIS — I69351 Hemiplegia and hemiparesis following cerebral infarction affecting right dominant side: Secondary | ICD-10-CM | POA: Diagnosis not present

## 2020-10-18 DIAGNOSIS — K5900 Constipation, unspecified: Secondary | ICD-10-CM | POA: Diagnosis present

## 2020-10-18 DIAGNOSIS — G35 Multiple sclerosis: Secondary | ICD-10-CM | POA: Diagnosis present

## 2020-10-18 DIAGNOSIS — Z992 Dependence on renal dialysis: Secondary | ICD-10-CM

## 2020-10-18 DIAGNOSIS — R110 Nausea: Secondary | ICD-10-CM | POA: Diagnosis present

## 2020-10-18 DIAGNOSIS — M21371 Foot drop, right foot: Secondary | ICD-10-CM | POA: Diagnosis not present

## 2020-10-18 DIAGNOSIS — I953 Hypotension of hemodialysis: Secondary | ICD-10-CM | POA: Diagnosis not present

## 2020-10-18 DIAGNOSIS — I679 Cerebrovascular disease, unspecified: Secondary | ICD-10-CM

## 2020-10-18 DIAGNOSIS — E1122 Type 2 diabetes mellitus with diabetic chronic kidney disease: Secondary | ICD-10-CM | POA: Diagnosis present

## 2020-10-18 DIAGNOSIS — E871 Hypo-osmolality and hyponatremia: Secondary | ICD-10-CM | POA: Diagnosis present

## 2020-10-18 DIAGNOSIS — E8889 Other specified metabolic disorders: Secondary | ICD-10-CM | POA: Diagnosis present

## 2020-10-18 DIAGNOSIS — G8110 Spastic hemiplegia affecting unspecified side: Secondary | ICD-10-CM

## 2020-10-18 DIAGNOSIS — R42 Dizziness and giddiness: Secondary | ICD-10-CM | POA: Diagnosis present

## 2020-10-18 DIAGNOSIS — I120 Hypertensive chronic kidney disease with stage 5 chronic kidney disease or end stage renal disease: Secondary | ICD-10-CM | POA: Diagnosis present

## 2020-10-18 DIAGNOSIS — I69320 Aphasia following cerebral infarction: Secondary | ICD-10-CM | POA: Diagnosis not present

## 2020-10-18 DIAGNOSIS — R000 Tachycardia, unspecified: Secondary | ICD-10-CM | POA: Diagnosis not present

## 2020-10-18 DIAGNOSIS — R5381 Other malaise: Secondary | ICD-10-CM | POA: Diagnosis present

## 2020-10-18 DIAGNOSIS — E119 Type 2 diabetes mellitus without complications: Secondary | ICD-10-CM

## 2020-10-18 DIAGNOSIS — I69322 Dysarthria following cerebral infarction: Secondary | ICD-10-CM | POA: Diagnosis not present

## 2020-10-18 DIAGNOSIS — E785 Hyperlipidemia, unspecified: Secondary | ICD-10-CM | POA: Diagnosis present

## 2020-10-18 DIAGNOSIS — N186 End stage renal disease: Secondary | ICD-10-CM

## 2020-10-18 DIAGNOSIS — I10 Essential (primary) hypertension: Secondary | ICD-10-CM | POA: Diagnosis present

## 2020-10-18 DIAGNOSIS — I69398 Other sequelae of cerebral infarction: Secondary | ICD-10-CM | POA: Diagnosis not present

## 2020-10-18 LAB — CBC
HCT: 29.2 % — ABNORMAL LOW (ref 36.0–46.0)
Hemoglobin: 9 g/dL — ABNORMAL LOW (ref 12.0–15.0)
MCH: 29.9 pg (ref 26.0–34.0)
MCHC: 30.8 g/dL (ref 30.0–36.0)
MCV: 97 fL (ref 80.0–100.0)
Platelets: 357 10*3/uL (ref 150–400)
RBC: 3.01 MIL/uL — ABNORMAL LOW (ref 3.87–5.11)
RDW: 19.4 % — ABNORMAL HIGH (ref 11.5–15.5)
WBC: 7.8 10*3/uL (ref 4.0–10.5)
nRBC: 0.4 % — ABNORMAL HIGH (ref 0.0–0.2)

## 2020-10-18 LAB — RENAL FUNCTION PANEL
Albumin: 2.6 g/dL — ABNORMAL LOW (ref 3.5–5.0)
Anion gap: 11 (ref 5–15)
BUN: 29 mg/dL — ABNORMAL HIGH (ref 6–20)
CO2: 26 mmol/L (ref 22–32)
Calcium: 8.6 mg/dL — ABNORMAL LOW (ref 8.9–10.3)
Chloride: 94 mmol/L — ABNORMAL LOW (ref 98–111)
Creatinine, Ser: 6.61 mg/dL — ABNORMAL HIGH (ref 0.44–1.00)
GFR, Estimated: 7 mL/min — ABNORMAL LOW (ref >60)
Glucose, Bld: 135 mg/dL — ABNORMAL HIGH (ref 70–99)
Phosphorus: 5.5 mg/dL — ABNORMAL HIGH (ref 2.5–4.6)
Potassium: 4 mmol/L (ref 3.5–5.1)
Sodium: 131 mmol/L — ABNORMAL LOW (ref 135–145)

## 2020-10-18 LAB — GLUCOSE, CAPILLARY
Glucose-Capillary: 76 mg/dL (ref 70–99)
Glucose-Capillary: 117 mg/dL — ABNORMAL HIGH (ref 70–99)
Glucose-Capillary: 180 mg/dL — ABNORMAL HIGH (ref 70–99)
Glucose-Capillary: 243 mg/dL — ABNORMAL HIGH (ref 70–99)

## 2020-10-18 MED ORDER — TIZANIDINE HCL 2 MG PO TABS
2.0000 mg | ORAL_TABLET | Freq: Every day | ORAL | Status: DC
  Administered 2020-10-19 – 2020-11-02 (×15): 2 mg via ORAL
  Filled 2020-10-18 (×15): qty 1

## 2020-10-18 MED ORDER — CARVEDILOL 12.5 MG PO TABS
12.5000 mg | ORAL_TABLET | Freq: Two times a day (BID) | ORAL | Status: DC
  Administered 2020-10-18 – 2020-10-26 (×15): 12.5 mg via ORAL
  Filled 2020-10-18 (×16): qty 1

## 2020-10-18 MED ORDER — INSULIN ASPART 100 UNIT/ML IJ SOLN
0.0000 [IU] | Freq: Every day | INTRAMUSCULAR | Status: DC
  Administered 2020-10-18 – 2020-10-22 (×4): 2 [IU] via SUBCUTANEOUS
  Administered 2020-10-23: 3 [IU] via SUBCUTANEOUS

## 2020-10-18 MED ORDER — DIPHENHYDRAMINE HCL 25 MG PO CAPS
25.0000 mg | ORAL_CAPSULE | Freq: Four times a day (QID) | ORAL | Status: DC | PRN
  Administered 2020-10-18 – 2020-11-02 (×24): 25 mg via ORAL
  Filled 2020-10-18 (×26): qty 1

## 2020-10-18 MED ORDER — FAMOTIDINE 20 MG PO TABS
20.0000 mg | ORAL_TABLET | Freq: Every day | ORAL | Status: DC
  Administered 2020-10-19 – 2020-11-03 (×16): 20 mg via ORAL
  Filled 2020-10-18 (×16): qty 1

## 2020-10-18 MED ORDER — CARVEDILOL 25 MG PO TABS: 12.5000 mg | ORAL_TABLET | Freq: Two times a day (BID) | ORAL | 1 refills | Status: DC

## 2020-10-18 MED ORDER — GABAPENTIN 100 MG PO CAPS
100.0000 mg | ORAL_CAPSULE | Freq: Every day | ORAL | Status: DC
  Administered 2020-10-18 – 2020-11-02 (×16): 100 mg via ORAL
  Filled 2020-10-18 (×16): qty 1

## 2020-10-18 MED ORDER — CHLORHEXIDINE GLUCONATE CLOTH 2 % EX PADS
6.0000 | MEDICATED_PAD | Freq: Every day | CUTANEOUS | Status: DC
  Administered 2020-10-19 – 2020-11-03 (×16): 6 via TOPICAL

## 2020-10-18 MED ORDER — DICLOFENAC SODIUM 1 % EX GEL
2.0000 g | Freq: Four times a day (QID) | CUTANEOUS | Status: DC
  Administered 2020-10-18 – 2020-11-03 (×35): 2 g via TOPICAL
  Filled 2020-10-18: qty 100

## 2020-10-18 MED ORDER — MELATONIN 3 MG PO TABS
3.0000 mg | ORAL_TABLET | Freq: Every day | ORAL | Status: DC
  Administered 2020-10-18 – 2020-11-02 (×15): 3 mg via ORAL
  Filled 2020-10-18 (×16): qty 1

## 2020-10-18 MED ORDER — INSULIN ASPART 100 UNIT/ML IJ SOLN
0.0000 [IU] | Freq: Three times a day (TID) | INTRAMUSCULAR | Status: DC
  Administered 2020-10-19: 7 [IU] via SUBCUTANEOUS
  Administered 2020-10-19: 1 [IU] via SUBCUTANEOUS
  Administered 2020-10-20: 3 [IU] via SUBCUTANEOUS
  Administered 2020-10-20: 5 [IU] via SUBCUTANEOUS
  Administered 2020-10-20: 2 [IU] via SUBCUTANEOUS
  Administered 2020-10-21: 5 [IU] via SUBCUTANEOUS
  Administered 2020-10-21: 2 [IU] via SUBCUTANEOUS
  Administered 2020-10-22: 5 [IU] via SUBCUTANEOUS
  Administered 2020-10-22: 3 [IU] via SUBCUTANEOUS
  Administered 2020-10-23: 1 [IU] via SUBCUTANEOUS
  Administered 2020-10-23: 2 [IU] via SUBCUTANEOUS
  Administered 2020-10-23: 3 [IU] via SUBCUTANEOUS

## 2020-10-18 MED ORDER — INSULIN GLARGINE 100 UNIT/ML ~~LOC~~ SOLN
15.0000 [IU] | Freq: Every day | SUBCUTANEOUS | Status: DC
  Administered 2020-10-18: 10 [IU] via SUBCUTANEOUS
  Filled 2020-10-18 (×2): qty 0.15

## 2020-10-18 MED ORDER — HYDRALAZINE HCL 50 MG PO TABS
50.0000 mg | ORAL_TABLET | Freq: Three times a day (TID) | ORAL | Status: DC
  Administered 2020-10-18 – 2020-11-03 (×41): 50 mg via ORAL
  Filled 2020-10-18 (×41): qty 1

## 2020-10-18 MED ORDER — ROSUVASTATIN CALCIUM 5 MG PO TABS
10.0000 mg | ORAL_TABLET | Freq: Every day | ORAL | Status: DC
  Administered 2020-10-19 – 2020-11-03 (×16): 10 mg via ORAL
  Filled 2020-10-18 (×18): qty 2

## 2020-10-18 MED ORDER — HEPARIN SODIUM (PORCINE) 1000 UNIT/ML IJ SOLN
INTRAMUSCULAR | Status: AC
  Administered 2020-10-18: 3200 [IU]
  Filled 2020-10-18: qty 4

## 2020-10-18 MED ORDER — SENNOSIDES-DOCUSATE SODIUM 8.6-50 MG PO TABS
1.0000 | ORAL_TABLET | Freq: Two times a day (BID) | ORAL | Status: DC
  Administered 2020-10-18 – 2020-11-03 (×32): 1 via ORAL
  Filled 2020-10-18 (×32): qty 1

## 2020-10-18 MED ORDER — OXYCODONE-ACETAMINOPHEN 5-325 MG PO TABS
1.0000 | ORAL_TABLET | Freq: Four times a day (QID) | ORAL | Status: DC | PRN
  Administered 2020-10-18 – 2020-11-02 (×23): 1 via ORAL
  Filled 2020-10-18 (×23): qty 1

## 2020-10-18 MED ORDER — METOCLOPRAMIDE HCL 5 MG PO TABS
5.0000 mg | ORAL_TABLET | Freq: Two times a day (BID) | ORAL | Status: DC
  Administered 2020-10-18 – 2020-10-21 (×7): 5 mg via ORAL
  Filled 2020-10-18 (×8): qty 1

## 2020-10-18 MED ORDER — DARBEPOETIN ALFA 200 MCG/0.4ML IJ SOSY
200.0000 ug | PREFILLED_SYRINGE | INTRAMUSCULAR | Status: DC
  Filled 2020-10-18: qty 0.4

## 2020-10-18 MED ORDER — POLYETHYLENE GLYCOL 3350 17 G PO PACK: 17.0000 g | PACK | Freq: Every day | ORAL | Status: DC | PRN

## 2020-10-18 MED ORDER — METHOCARBAMOL 500 MG PO TABS
500.0000 mg | ORAL_TABLET | Freq: Four times a day (QID) | ORAL | Status: DC | PRN
  Administered 2020-10-19: 500 mg via ORAL
  Filled 2020-10-18: qty 1

## 2020-10-18 MED ORDER — AMLODIPINE BESYLATE 10 MG PO TABS
10.0000 mg | ORAL_TABLET | Freq: Every day | ORAL | Status: DC
  Administered 2020-10-19 – 2020-11-03 (×14): 10 mg via ORAL
  Filled 2020-10-18 (×16): qty 1

## 2020-10-18 MED ORDER — POLYETHYLENE GLYCOL 3350 17 G PO PACK
17.0000 g | PACK | Freq: Every day | ORAL | Status: DC
  Administered 2020-10-20 – 2020-11-01 (×13): 17 g via ORAL
  Filled 2020-10-18 (×16): qty 1

## 2020-10-18 NOTE — H&P (Signed)
Physical Medicine and Rehabilitation Admission H&P    Chief Complaint  Patient presents with  . Debility with functional deficits    HPI: Hannah [name] is a 55 year old female with history of multiple strokes with residual right spastic HP, MS (no treatment), T2DM with progressive nephropathy, multiple recent admissions for acute on chronic renal failure and was admitted on 09/26/20 with fluid overload, malaise and uremia. She was started on IV diuresis with good results but continued to have uremic symptoms. Low iron stores supplemented and she ws started on aranesp for significant anemia due to chronic disease. Dialysis recommended by Dr. Moshe Cipro and after multiple discussions, patient was agreeable to have IJ placed by Dr. Anselm Pancoast on 05/16. MWF HD initiated and she had RUE AV fistula placed by Dr. Donzetta Matters on 05/19. She reported LB and right thigh/flank pain with spasms and was found to have spontaneous right psoas hematoma on 05/23. Dr. Donne Hazel consulted for input and recommended holding Plavix X 2 weeks. She continued to be limited by pain and weakness. CIR recommended due to recent functional decline.  +constipation   Review of Systems  Constitutional: Negative for fever.  HENT: Negative for hearing loss.   Eyes: Negative for pain.  Respiratory: Negative for shortness of breath and stridor.   Cardiovascular: Negative for chest pain, palpitations and leg swelling.  Gastrointestinal: Positive for constipation. Negative for abdominal pain, nausea and vomiting.  Musculoskeletal: Negative for myalgias and neck pain.  Skin: Negative for rash.  Neurological: Positive for dizziness and weakness.  Psychiatric/Behavioral: The patient is not nervous/anxious.       Past Medical History:  Diagnosis Date  . Cerebral infarction ([place]) 07/12/2017   2009 first one, has had a total of 4 stokes  . Diabetes type 2, controlled ([place])   . Hemiparesis affecting right side as late effect of  cerebrovascular accident (CVA) ([place])   . Hyperlipidemia   . Hypertension   . Multiple sclerosis ([place]) 2009  . Stroke [facility])     Past Surgical History:  Procedure Laterality Date  . APPENDECTOMY    . AV FISTULA PLACEMENT Right 10/02/2020   Procedure: RIGHT ARTERIOVENOUS (AV) FISTULA CREATION;  Surgeon: Waynetta Sandy, MD;  Location: [place];  Service: Vascular;  Laterality: Right;  . CYSTOSTOMY W/ BLADDER BIOPSY    . IR FLUORO GUIDE CV LINE RIGHT  09/29/2020  . IR US GUIDE VASC ACCESS RIGHT  09/29/2020  . OVARIAN CYST SURGERY    . stent placed in heart      No family history on file.    Social History:  reports that she has never smoked. She has never used smokeless tobacco. She reports previous alcohol use. She reports that she does not use drugs.   Allergies  Allergen Reactions  . Ibuprofen Other (See Comments) and Swelling    Edema Swelling to hands, feet and face   . Influenza Vaccines Anaphylaxis    Swelling, fever  . Pneumococcal Vaccine Anaphylaxis    Swelling, fever Swelling, fever   . Promethazine Other (See Comments)    hallucinations hallucinations   . Ambien [Zolpidem]     Other reaction(s): Unknown  . Cyclobenzaprine Other (See Comments)    hallucinations   . Tape Itching  . Tramadol Hcl     Other reaction(s): hallucinations  . Oxycodone Nausea Only and Nausea And Vomiting    Medications Prior to Admission  Medication Sig Dispense Refill  . acetaminophen (TYLENOL) 325 MG tablet Take 2 tablets (  650 mg total) by mouth every 4 (four) hours as needed for mild pain (or temp > 37.5 C (99.5 F)).    Marland Kitchen amLODipine (NORVASC) 10 MG tablet Take 1 tablet (10 mg total) by mouth daily. 30 tablet 0  . clopidogrel (PLAVIX) 75 MG tablet Take 1 tablet (75 mg total) by mouth daily. 30 tablet 1  . diclofenac Sodium (VOLTAREN) 1 % GEL Apply 2 g topically 4 (four) times daily. (Patient taking differently: Apply 2 g topically 4 (four) times daily as needed (pain).) 2  g 0  . ergocalciferol (VITAMIN D2) 1.25 MG (50000 UT) capsule Take 50,000 Units by mouth once a week.    . famotidine (PEPCID) 20 MG tablet Take 1 tablet (20 mg total) by mouth daily. 30 tablet 0  . FLUoxetine (PROZAC) 20 MG capsule Take 1 capsule (20 mg total) by mouth daily. 30 capsule 3  . glipiZIDE (GLUCOTROL) 10 MG tablet Take 1 tablet (10 mg total) by mouth 2 (two) times daily before a meal. 60 tablet 0  . hydrALAZINE (APRESOLINE) 50 MG tablet Take 1 tablet (50 mg total) by mouth every 8 (eight) hours. 270 tablet 1  . insulin degludec (TRESIBA FLEXTOUCH) 100 UNIT/ML FlexTouch Pen Inject 15-45 Units into the skin at bedtime. Sliding scale    . insulin lispro (INSULIN LISPRO) 100 UNIT/ML KwikPen Junior Inject 10 Units into the skin 3 (three) times daily.    . melatonin 3 MG TABS tablet Take 1 tablet (3 mg total) by mouth at bedtime. 30 tablet 0  . nitroGLYCERIN (NITROSTAT) 0.4 MG SL tablet Place 1 tablet (0.4 mg total) under the tongue every 5 (five) minutes as needed for chest pain. 30 tablet 12  . rosuvastatin (CRESTOR) 20 MG tablet Take 1 tablet (20 mg total) by mouth daily. 30 tablet 0  . senna-docusate (SENOKOT-S) 8.6-50 MG tablet Take 1 tablet by mouth 2 (two) times daily as needed for moderate constipation.    . sodium bicarbonate 650 MG tablet Take 1 tablet (650 mg total) by mouth 3 (three) times daily. 90 tablet 1  . tiZANidine (ZANAFLEX) 2 MG tablet Take 1 tablet (2 mg total) by mouth 3 (three) times daily. 90 tablet 0  . carvedilol (COREG) 25 MG tablet Take 1 tablet (25 mg total) by mouth 2 (two) times daily with a meal. (Patient not taking: No sig reported) 180 tablet 1    Drug Regimen Review  Drug regimen was reviewed and remains appropriate with no significant issues identified  Home:     Functional History:    Functional Status:  Mobility:          ADL:    Cognition:        Blood pressure (!) 151/67, pulse 94, temperature 98.5 F (36.9 C), temperature  source Oral, resp. rate 14, SpO2 100 %. Physical Exam Gen: no distress, normal appearing HEENT: oral mucosa pink and moist, NCAT Cardio: Reg rate Chest: normal effort, normal rate of breathing Abd: soft, non-distended Ext: no edema Psych: pleasant, normal affect Skin: intact Neurological:     Mental Status: She is alert and oriented to person, place, and time.     Comments: Right facial weakness with  Mild dysarthria and expressive deficits. Right HP--unchanged per input.      Results for orders placed or performed during the hospital encounter of 09/26/20 (from the past 48 hour(s))  Glucose, capillary     Status: Abnormal   Collection Time: 10/16/20  8:53 PM  Result Value  Ref Range   Glucose-Capillary 173 (H) 70 - 99 mg/dL    Comment: Glucose reference range applies only to samples taken after fasting for at least 8 hours.  Renal function panel     Status: Abnormal   Collection Time: 10/17/20  1:35 AM  Result Value Ref Range   Sodium 132 (L) 135 - 145 mmol/L   Potassium 3.5 3.5 - 5.1 mmol/L   Chloride 97 (L) 98 - 111 mmol/L   CO2 28 22 - 32 mmol/L   Glucose, Bld 112 (H) 70 - 99 mg/dL    Comment: Glucose reference range applies only to samples taken after fasting for at least 8 hours.   BUN 19 6 - 20 mg/dL   Creatinine, Ser 5.49 (H) 0.44 - 1.00 mg/dL    Comment: DELTA CHECK NOTED   Calcium 8.7 (L) 8.9 - 10.3 mg/dL   Phosphorus 4.5 2.5 - 4.6 mg/dL   Albumin 2.6 (L) 3.5 - 5.0 g/dL   GFR, Estimated 9 (L) >60 mL/min    Comment: (NOTE) Calculated using the CKD-EPI Creatinine Equation (2021)    Anion gap 7 5 - 15    Comment: Performed at [facility] [address] [place], [place] [address]  Glucose, capillary     Status: Abnormal   Collection Time: 10/17/20  6:28 AM  Result Value Ref Range   Glucose-Capillary 164 (H) 70 - 99 mg/dL    Comment: Glucose reference range applies only to samples taken after fasting for at least 8 hours.  Glucose, capillary     Status:  Abnormal   Collection Time: 10/17/20 11:35 AM  Result Value Ref Range   Glucose-Capillary 271 (H) 70 - 99 mg/dL    Comment: Glucose reference range applies only to samples taken after fasting for at least 8 hours.  Glucose, capillary     Status: Abnormal   Collection Time: 10/17/20  4:26 PM  Result Value Ref Range   Glucose-Capillary 152 (H) 70 - 99 mg/dL    Comment: Glucose reference range applies only to samples taken after fasting for at least 8 hours.  Glucose, capillary     Status: Abnormal   Collection Time: 10/17/20  7:38 PM  Result Value Ref Range   Glucose-Capillary 182 (H) 70 - 99 mg/dL    Comment: Glucose reference range applies only to samples taken after fasting for at least 8 hours.  Glucose, capillary     Status: Abnormal   Collection Time: 10/17/20  9:02 PM  Result Value Ref Range   Glucose-Capillary 137 (H) 70 - 99 mg/dL    Comment: Glucose reference range applies only to samples taken after fasting for at least 8 hours.  Renal function panel     Status: Abnormal   Collection Time: 10/18/20  1:56 AM  Result Value Ref Range   Sodium 131 (L) 135 - 145 mmol/L   Potassium 4.0 3.5 - 5.1 mmol/L   Chloride 94 (L) 98 - 111 mmol/L   CO2 26 22 - 32 mmol/L   Glucose, Bld 135 (H) 70 - 99 mg/dL    Comment: Glucose reference range applies only to samples taken after fasting for at least 8 hours.   BUN 29 (H) 6 - 20 mg/dL   Creatinine, Ser 6.61 (H) 0.44 - 1.00 mg/dL   Calcium 8.6 (L) 8.9 - 10.3 mg/dL   Phosphorus 5.5 (H) 2.5 - 4.6 mg/dL   Albumin 2.6 (L) 3.5 - 5.0 g/dL   GFR, Estimated 7 (  L) >60 mL/min    Comment: (NOTE) Calculated using the CKD-EPI Creatinine Equation (2021)    Anion gap 11 5 - 15    Comment: Performed at [facility], [place] [address] [place], [place] [address]  Glucose, capillary     Status: None   Collection Time: 10/18/20  6:06 AM  Result Value Ref Range   Glucose-Capillary 76 70 - 99 mg/dL    Comment: Glucose reference range applies only to  samples taken after fasting for at least 8 hours.  Glucose, capillary     Status: Abnormal   Collection Time: 10/18/20 11:09 AM  Result Value Ref Range   Glucose-Capillary 117 (H) 70 - 99 mg/dL    Comment: Glucose reference range applies only to samples taken after fasting for at least 8 hours.  CBC     Status: Abnormal   Collection Time: 10/18/20 12:59 PM  Result Value Ref Range   WBC 7.8 4.0 - 10.5 K/uL   RBC 3.01 (L) 3.87 - 5.11 MIL/uL   Hemoglobin 9.0 (L) 12.0 - 15.0 g/dL   HCT 29.2 (L) 36.0 - 46.0 %   MCV 97.0 80.0 - 100.0 fL   MCH 29.9 26.0 - 34.0 pg   MCHC 30.8 30.0 - 36.0 g/dL   RDW 19.4 (H) 11.5 - 15.5 %   Platelets 357 150 - 400 K/uL   nRBC 0.4 (H) 0.0 - 0.2 %    Comment: Performed at [facility] [address], [place], [place] [address]   No results found.     Medical Problem List and Plan: 1.  Cardiopulmonary debility  -patient may shower  -ELOS/Goals: 2-3 weeks MinA  -Admit to CIR 2.  Antithrombotics: -DVT/anticoagulation:  Mechanical: Sequential compression devices, below knee Bilateral lower extremities  -antiplatelet therapy: Plavix on hold  3. Pain Management: Continue Oxycodone prn.   --on Tizanidine 2 mg tid as per home regimen.  Muscle relaxer's contraindicated with HD--->changed to 2 mg HS per recommendations.   --Continue low dose gabapentin.  4. Mood: LCSW to follow for evaluation and support.   -antipsychotic agents: N/A 5. Neuropsych: This patient is capable of making decisions on her own behalf. 6. Skin/Wound Care: Routine pressure relief measures.  7. Fluids/Electrolytes/Nutrition: Strict I/O.  8. ESRD: Now on HD MWF--schedule at the end of the day  --has been clipped for [facility] [place] 9. HTN: Monitor BP--continue Norvasc, Coreg bid, apresoline tid and  10 Hyponatremia: SSRI d/c by nephrology with slow improved.  11. Nausea: Continue reglan bid-> wean to off? 12. Constipation: Managed with senna S bid and miralax daily.     Izora Ribas, MD 10/18/2020   Bary Leriche, PA-C

## 2020-10-18 NOTE — Progress Notes (Signed)
   10/18/20 1715  Vitals  Temp 98.7 F (37.1 C)  Temp Source Oral  BP (!) 149/70  BP Location Left Arm  BP Method Automatic  Patient Position (if appropriate) Lying  Pulse Rate 89  Pulse Rate Source Monitor  ECG Heart Rate 90  Resp 16  Oxygen Therapy  SpO2 98 %  O2 Device Room Air  Pain Assessment  Pain Scale 0-10  Pain Score 0  Dialysis Weight  Weight 54.7 kg  Type of Weight Post-Dialysis  Post-Hemodialysis Assessment  Rinseback Volume (mL) 250 mL  KECN 260 V  Dialyzer Clearance Lightly streaked  Duration of HD Treatment -hour(s) 3.5 hour(s)  Hemodialysis Intake (mL) 500 mL  UF Total -Machine (mL) 2500 mL  Net UF (mL) 2000 mL  Tolerated HD Treatment Yes  Post-Hemodialysis Comments tx complete-pt stable  Hemodialysis Catheter Right Internal jugular Double lumen Permanent (Tunneled)  Placement Date/Time: 09/29/20 1423   Placed prior to admission: No  Time Out: Correct patient;Correct site;Correct procedure  Maximum sterile barrier precautions: Hand hygiene;Sterile gloves;Cap;Large sterile sheet;Mask;Sterile gown  Site Prep: Chlorh...  Site Condition No complications  Blue Lumen Status Flushed;Capped (Central line);Heparin locked  Red Lumen Status Flushed;Capped (Central line);Heparin locked  Catheter fill solution Heparin 1000 units/ml  Catheter fill volume (Arterial) 1.6 cc  Catheter fill volume (Venous) 1.6  Dressing Type Transparent  Dressing Status Clean;Dry;Intact  Antimicrobial disc in place? Yes  Drainage Description None  Dressing Change Due 10/19/20  Post treatment catheter status Capped and Clamped  HD tx complete-pt stable. UF goal met. No complications noted throughout tx.

## 2020-10-18 NOTE — Progress Notes (Signed)
Patient arrived to unit with transport from dialysis.

## 2020-10-18 NOTE — Progress Notes (Signed)
Patient ID: Richael Coreas, female   DOB: 1965/10/03, 55 y.o.   MRN: [id number] [facility] Progress Note   Assessment/ Plan:   1.  End-stage renal disease: From progressive chronic kidney disease and presentation with uremic symptoms needing initiation of dialysis.  Status post right brachiobasilic AV fistula by Dr. Donzetta Matters on 5/19 and will get hemodialysis today to transition to TTS schedule (accepted to second shift at [facility]).  2.  Hypertension: Blood pressure improving with titration of anti-hypertensive therapy and pain management.  Monitor with hemodialysis today. 3.  Anemia of chronic disease: Without overt blood loss and at acceptable range on high-dose ESA with continued H/H trend. 4.  Secondary hyperparathyroidism: Calcium and phosphorus level within acceptable range, PTH at goal at 84.  Continue to monitor on renal diet. 5.  History of recent CVA with right-sided spastic hemiparesis: Plans noted for admission to CIR today after approval of insurance appeal.  Subjective:   Denies any acute events overnight.  Anticipated admission to CIR today   Objective:   BP (!) 168/71 (BP Location: Left Arm)   Pulse 84   Temp 98.4 F (36.9 C)   Resp 17   Ht '5\' 2"'$  (1.575 m)   Wt 57.6 kg   SpO2 96%   BMI 23.23 kg/m   Intake/Output Summary (Last 24 hours) at 10/18/2020 1001 Last data filed at 10/18/2020 0800 Gross per 24 hour  Intake 440 ml  Output --  Net 440 ml   Weight change:   Physical Exam: Gen: Resting comfortably in bed, she is interactive/pleasant CVS: Pulse regular rhythm, normal rate, S1 and S2 normal Resp: Clear to auscultation bilaterally without distinct rales or rhonchi.  Right IJ TDC in place Abd: Soft, obese, nontender, bowel sounds normal Ext: No lower extremity edema, right upper arm AV fistula with palpable thrill  Imaging: No results found.  Labs: BMET Recent Labs  Lab 10/12/20 0419 10/13/20 0104 10/14/20 0147  10/15/20 0239 10/16/20 0336 10/17/20 0135 10/18/20 0156  NA 128* 128* 131* 128* 132* 132* 131*  K 3.7 4.0 3.9 3.7 3.5 3.5 4.0  CL 94* 94* 97* 92* 94* 97* 94*  CO2 '27 24 28 27 30 28 26  '$ GLUCOSE 147* 200* 177* 134* 90 112* 135*  BUN 14 27* '12 19 8 19 '$ 29*  CREATININE 4.76* 6.54* 3.36* 4.81* 3.23* 5.49* 6.61*  CALCIUM 8.4* 8.2* 8.2* 9.0 8.7* 8.7* 8.6*  PHOS 4.2 5.2* 3.1 3.9 3.7 4.5 5.5*   CBC Recent Labs  Lab 10/15/20 1500  WBC 8.6  HGB 9.8*  HCT 31.7*  MCV 94.9  PLT 405*    Medications:    . amLODipine  10 mg Oral Daily  . carvedilol  12.5 mg Oral BID WC  . Chlorhexidine Gluconate Cloth  6 each Topical Q0600  . Chlorhexidine Gluconate Cloth  6 each Topical Q0600  . darbepoetin (ARANESP) injection - DIALYSIS  200 mcg Intravenous Q Mon-HD  . diclofenac Sodium  2 g Topical QID  . famotidine  20 mg Oral Daily  . gabapentin  100 mg Oral QHS  . hydrALAZINE  50 mg Oral Q8H  . insulin aspart  0-5 Units Subcutaneous QHS  . insulin aspart  0-9 Units Subcutaneous TID WC  . insulin glargine  15 Units Subcutaneous QHS  . melatonin  3 mg Oral QHS  . metoCLOPramide  5 mg Oral BID  . polyethylene glycol  17 g Oral Daily  . rosuvastatin  10 mg Oral Daily  .  senna-docusate  1 tablet Oral BID  . sodium chloride flush  3 mL Intravenous Q12H  . tiZANidine  2 mg Oral TID   Elmarie Shiley, MD 10/18/2020, 10:01 AM

## 2020-10-18 NOTE — Discharge Summary (Signed)
Physician Discharge Summary   Patient ID: Fanny Beel MRN: [id number] DOB/AGE: [birth year] 55 y.o.  Admit date: 09/26/2020 Discharge date: 10/18/2020  Primary Care Physician:  Vernie Shanks, MD   Recommendations for Outpatient Follow-up:   Patient being discharged to inpatient rehab  Accepted to [facility] outpatient hemodialysis on TTS schedule, chair time of 11:45 AM, to arrive to the appointment at 11:25 AM  Home Health: None, CIR Equipment/Devices:   Discharge Condition: stable CODE STATUS: FULL  Diet recommendation: Carb modified diet   Discharge Diagnoses:    New ESRD on hemodialysis Right psoas hematoma   . Volume overload . Essential hypertension . Hyperlipidemia . Multiple sclerosis ([place]) . CKD (chronic kidney disease) stage 5, GFR less than 15 ml/min ([place]), now progressed to ESRD . Uncontrolled type 2 diabetes mellitus with hyperglycemia ([place]) . History of CVA with residual right-sided weakness ABLA with anemia of chronic disease  Consults:   CIR Nephrology    Allergies:   Allergies  Allergen Reactions  . Ibuprofen Other (See Comments) and Swelling    Edema Swelling to hands, feet and face   . Influenza Vaccines Anaphylaxis    Swelling, fever  . Pneumococcal Vaccine Anaphylaxis    Swelling, fever Swelling, fever   . Promethazine Other (See Comments)    hallucinations hallucinations   . Ambien [Zolpidem]     Other reaction(s): Unknown  . Cyclobenzaprine Other (See Comments)    hallucinations   . Tape Itching  . Tramadol Hcl     Other reaction(s): hallucinations  . Oxycodone Nausea Only and Nausea And Vomiting     DISCHARGE MEDICATIONS: Allergies as of 10/18/2020      Reactions   Ibuprofen Other (See Comments), Swelling   Edema Swelling to hands, feet and face   Influenza Vaccines Anaphylaxis   Swelling, fever   Pneumococcal Vaccine Anaphylaxis   Swelling, fever Swelling, fever   Promethazine Other (See Comments)    hallucinations hallucinations   Ambien [zolpidem]    Other reaction(s): Unknown   Cyclobenzaprine Other (See Comments)   hallucinations   Tape Itching   Tramadol Hcl    Other reaction(s): hallucinations   Oxycodone Nausea Only, Nausea And Vomiting      Medication List    TAKE these medications   acetaminophen 325 MG tablet Commonly known as: TYLENOL Take 2 tablets (650 mg total) by mouth every 4 (four) hours as needed for mild pain (or temp > 37.5 C (99.5 F)).   amLODipine 10 MG tablet Commonly known as: NORVASC Take 1 tablet (10 mg total) by mouth daily.   carvedilol 25 MG tablet Commonly known as: COREG Take 0.5 tablets (12.5 mg total) by mouth 2 (two) times daily with a meal. What changed: how much to take   diclofenac Sodium 1 % Gel Commonly known as: VOLTAREN Apply 2 g topically 4 (four) times daily. What changed:   when to take this  reasons to take this   ergocalciferol 1.25 MG (50000 UT) capsule Commonly known as: VITAMIN D2 Take 50,000 Units by mouth once a week.   famotidine 20 MG tablet Commonly known as: PEPCID Take 1 tablet (20 mg total) by mouth daily.   FLUoxetine 20 MG capsule Commonly known as: PROZAC Take 1 capsule (20 mg total) by mouth daily.   glipiZIDE 10 MG tablet Commonly known as: GLUCOTROL Take 1 tablet (10 mg total) by mouth 2 (two) times daily before a meal.   hydrALAZINE 50 MG tablet Commonly known as: APRESOLINE Take 1  tablet (50 mg total) by mouth every 8 (eight) hours.   insulin lispro 100 UNIT/ML KwikPen Junior Generic drug: insulin lispro Inject 10 Units into the skin 3 (three) times daily.   melatonin 3 MG Tabs tablet Take 1 tablet (3 mg total) by mouth at bedtime.   nitroGLYCERIN 0.4 MG SL tablet Commonly known as: NITROSTAT Place 1 tablet (0.4 mg total) under the tongue every 5 (five) minutes as needed for chest pain.   rosuvastatin 20 MG tablet Commonly known as: CRESTOR Take 1 tablet (20 mg total) by mouth  daily.   senna-docusate 8.6-50 MG tablet Commonly known as: Senokot-S Take 1 tablet by mouth 2 (two) times daily as needed for moderate constipation.   sodium bicarbonate 650 MG tablet Take 1 tablet (650 mg total) by mouth 3 (three) times daily.   tiZANidine 2 MG tablet Commonly known as: ZANAFLEX Take 1 tablet (2 mg total) by mouth 3 (three) times daily.   Tyler Aas FlexTouch 100 UNIT/ML FlexTouch Pen Generic drug: insulin degludec Inject 15-45 Units into the skin at bedtime. Sliding scale     ASK your doctor about these medications   clopidogrel 75 MG tablet Commonly known as: PLAVIX Take 1 tablet (75 mg total) by mouth daily.        Brief H and P: For complete details please refer to admission H and P, but in brief Patient is a 55 year old female with CKD stage V, anemia, CVA, hypertension, diabetes, hyperlipidemia, MS presented with shortness of breath, lower extremity swelling and concerns for possible uremia. Nephrology consulted, was deemed to have progressed to ESRD.  Patient started on hemodialysis.  Hospitalization complicated by deconditioning and retroperitoneal bleed. Has been declined for CIR, now discharged appealed.  Hospital Course:   New ESRD on hemodialysis, presented with volume overload, metabolic acidosis, uremia -Nephrology consulted, AV fistula created on 10/02/2020 by Dr. Donzetta Matters -Started on hemodialysis with [facility], MWF while inpatient  -Patient has been accepted to [facility] outpatient HD clinic on TTS schedule with chair time of 11:45 AM and arrive to her appointment is at 11:25 AM -Undergoing hemodialysis today, accepted to CIR  Active Problems: Right flank pain/right groin pain, psoas enlargement with right psoas hematoma -CT pelvis, lumbar spine showed right psoas enlargement most compatible with intramuscular hematoma, infection possible -Continue conservative management per vascular surgery, Dr. Carlis Abbott. -Dr. Marthenia Rolling discussed with Dr. Donne Hazel on  5/25, recommended to hold Plavix for 2 weeks, no surgery needed and continue pain management.  Resume Plavix on 10/23/2020  Essential hypertension -BP stable  Diabetes mellitus type 2, uncontrolled with CKD progressed to  ESRD, on long-term insulin -Hemoglobin A1c 8.7 on 09/13/2020 -CBG stable, continue Lantus 15 units qhs, NovoLog sliding scale insulin  History of recent CVA in 05/2020 with residual right-sided weakness Hyperlipidemia -Plavix currently on hold, continue Crestor -CT head showed moderate extensive white matter hypodensities bilaterally similar to prior CT.  MRI may represent demyelinating disease with history of MS. -Outpatient follow-up with neurology  AB LA with anemia of chronic disease/ESRD -Patient was transfused 1 unit packed RBCs on 5/22, started on ESA -H&H stable  Left abdominal wall hematoma -At heparin injection site, monitor off heparin  History of chronic multiple sclerosis -Outpatient follow-up with neurology/ Dr Leonie Man.  No acute issues  Day of Discharge S: No acute complaints, patient is looking forward to starting her rehab at [facility].  BP 115/60 (BP Location: Left Arm)   Pulse 82   Temp 98 F (36.7 C) (Oral)   Resp  17   Ht '5\' 2"'$  (1.575 m)   Wt 57 kg   SpO2 97%   BMI 22.98 kg/m   Physical Exam: General: Alert and awake oriented x3 not in any acute distress. CVS: S1-S2 clear no murmur rubs or gallops Chest: CTA B, right IJ TDC Abdomen: soft nontender, nondistended, normal bowel sounds Extremities: no cyanosis, clubbing or edema noted bilaterally Neuro: no new FND's    Get Medicines reviewed and adjusted: Please take all your medications with you for your next visit with your Primary MD  Please request your Primary MD to go over all hospital tests and procedure/radiological results at the follow up. Please ask your Primary MD to get all Hospital records sent to his/her office.  If you experience worsening of your admission symptoms,  develop shortness of breath, life threatening emergency, suicidal or homicidal thoughts you must seek medical attention immediately by calling 911 or calling your MD immediately  if symptoms less severe.  You must read complete instructions/literature along with all the possible adverse reactions/side effects for all the Medicines you take and that have been prescribed to you. Take any new Medicines after you have completely understood and accept all the possible adverse reactions/side effects.   Do not drive when taking pain medications.   Do not take more than prescribed Pain, Sleep and Anxiety Medications  Special Instructions: If you have smoked or chewed Tobacco  in the last 2 yrs please stop smoking, stop any regular Alcohol  and or any Recreational drug use.  Wear Seat belts while driving.  Please note  You were cared for by a hospitalist during your hospital stay. Once you are discharged, your primary care physician will handle any further medical issues. Please note that NO REFILLS for any discharge medications will be authorized once you are discharged, as it is imperative that you return to your primary care physician (or establish a relationship with a primary care physician if you do not have one) for your aftercare needs so that they can reassess your need for medications and monitor your lab values.   The results of significant diagnostics from this hospitalization (including imaging, microbiology, ancillary and laboratory) are listed below for reference.      Procedures/Studies:  DG Chest 2 View  Result Date: 09/26/2020 CLINICAL DATA:  Shortness of breath EXAM: CHEST - 2 VIEW COMPARISON:  None. FINDINGS: There is ill-defined airspace opacity in portions of the right middle and lower lobes. There is slight left base atelectasis. Lungs elsewhere clear. Heart is upper normal in size with pulmonary vascularity normal. No adenopathy. No bone lesions. IMPRESSION: Ill-defined  opacity consistent with pneumonia in portions of the right middle and lower lobes. Mild left base atelectasis. Lungs otherwise clear. Heart upper normal in size. Electronically Signed   By: Lowella Grip III M.D.   On: 09/26/2020 15:06   CT HEAD WO CONTRAST  Result Date: 10/03/2020 CLINICAL DATA:  Headache.  History multiple sclerosis EXAM: CT HEAD WITHOUT CONTRAST TECHNIQUE: Contiguous axial images were obtained from the base of the skull through the vertex without intravenous contrast. COMPARISON:  CT head 06/09/2020.  MRI head 06/09/2020 FINDINGS: Brain: Moderate white matter changes bilaterally similar to prior MRI. Focal hypodensity in the posterior limb internal capsule on the left likely an area of chronic infarction. Mild hypodensity in the pons centrally unchanged from the prior study Ventricle size normal.  No acute infarct, hemorrhage, mass Vascular: Negative for hyperdense vessel Skull: Negative Sinuses/Orbits: Mild mucosal edema paranasal  sinuses involving the sphenoid sinus. Bilateral cataract extraction Other: None IMPRESSION: Moderate to extensive white matter hypodensity bilaterally. This is similar to prior CT and MRI. This may represent demyelinating disease given history of multiple sclerosis. Chronic ischemic changes could have a similar appearance. No acute abnormality. Electronically Signed   By: Franchot Gallo M.D.   On: 10/03/2020 20:07   CT LUMBAR SPINE WO CONTRAST  Result Date: 10/06/2020 CLINICAL DATA:  Low back pain and right-sided abdominal pain. EXAM: CT LUMBAR SPINE WITHOUT CONTRAST TECHNIQUE: Multidetector CT imaging of the lumbar spine was performed without intravenous contrast administration. Multiplanar CT image reconstructions were also generated. COMPARISON:  None. FINDINGS: Segmentation: 5 lumbar type vertebral bodies. Alignment: Normal Vertebrae: No fracture or focal lesion at L4 or above. At L5-S1, there are some irregularities of the endplates, more affecting the  inferior endplate of L5 than the superior endplate of S1. These are favored to represent degenerative change. However, based on the findings associated with the right ileo psoas musculature, I cannot completely rule out the possibility of discitis at this level. See below. Paraspinal and other soft tissues: Marked enlargement of the right psoas muscle with areas of increased and decreased density. This involves the right iliacus muscle to a lesser degree. The appearance seems more consistent with spontaneous ileo psoas hemorrhage than infection, but I can not completely rule out that possibility. There is not appear to be any more generalized retroperitoneal bleeding or definite inflammatory change at the paravertebral L5-S1 level. There is aortic atherosclerotic calcification. There is a low-density area in the upper pole of the right kidney which is not primarily or completely evaluated but assumed to represent a cyst. 2.7 cm cyst with seen in this region by recent ultrasound. Disc levels: No significant disc space pathology at T12-L1 or L1-2. L2-3: Chronic left-sided disc protrusion with annular calcification. Mild narrowing of the left lateral recess. L3-4: Unremarkable interspace. L4-5: Mild bulging of the disc.  No compressive stenosis. L5-S1: Endplate irregularities and bulging of the disc as described above. Findings are favored to represent degenerative change rather than infectious discitis, though I can not completely exclude the latter. IMPRESSION: Enlargement of the right psoas muscle and to a lesser extent the upper portion of the right iliacus muscle with areas of low and high density most consistent with spontaneous intramuscular hematoma. I can not completely rule out the possibility of infection but do not favor that. Some irregularity of the endplates at 075-GRM, favored to be degenerative. Early discitis is not excluded but not favored because of the reasons enumerated above. Mild degenerative  changes in the lumbar spine as above, without advanced finding or definite neural compressive stenosis. These results will be called to the ordering clinician or representative by the Radiologist Assistant, and communication documented in the PACS or [employer]. Electronically Signed   By: Nelson Chimes M.D.   On: 10/06/2020 11:46   CT PELVIS WO CONTRAST  Result Date: 10/06/2020 CLINICAL DATA:  RLQ abdomen pain EXAM: CT PELVIS WITHOUT CONTRAST TECHNIQUE: Multidetector CT imaging of the pelvis was performed following the standard protocol without intravenous contrast. COMPARISON:  Same day CT lumbar spine. FINDINGS: Urinary Tract:  The bladder is moderately distended. Bowel:  Unremarkable visualized pelvic bowel loops. Vascular/Lymphatic: There is aortoiliac atherosclerotic disease. No pathologically enlarged lymph nodes. Reproductive:  Unremarkable. Other: There is heterogeneous collection with enlargement of the right psoas muscle, measuring approximately 12.5 x 5.2 x 5.0 cm, and to much lesser extent the upper portion of  the right iliacus muscle. Musculoskeletal: No evidence of acute fracture. Symmetric bilateral SI joint degenerative change. There is no osseous destruction is/erosion. There is mild bilateral hip degenerative change. Mild endplate irregularity at L5-S1, see separately dictated lumbar spine CT. There are multiple soft tissue densities along the lower anterior abdomen, likely from medication injection. IMPRESSION: Right psoas muscle enlargement and due to an intramuscular heterogeneous collection measuring approximately 12.5 x 5.2 x 5.0 cm, most compatible with intramuscular hematoma. Infection is possible. Correlate with laboratory findings. No evidence of acute fracture. Symmetric degenerative changes involving the SI joints. Endplate irregularity at L5-S1 which is favored to be chronic. Given the iliopsoas findings, discitis-osteomyelitis or right SI joint infection cannot be completely  excluded on noncontrast CT, but felt to be less likely. MRI would be useful for further evaluation. As noted on separately dictated CT of the lumbar spine, these results will be called to the ordering clinician or representative by the Radiologist Assistant, and communication documented in the PACS or [employer]. Electronically Signed   By: Maurine Simmering   On: 10/06/2020 12:12   IR Fluoro Guide CV Line Right  Result Date: 09/29/2020 INDICATION: 55 year old with chronic kidney disease and needs a hemodialysis catheter. EXAM: FLUOROSCOPIC AND ULTRASOUND GUIDED PLACEMENT OF A TUNNELED DIALYSIS CATHETER Physician: Stephan Minister. Anselm Pancoast, MD MEDICATIONS: Ancef 1 g; The antibiotic was administered within an appropriate time interval prior to skin puncture. ANESTHESIA/SEDATION: Versed 1.0 mg IV; Fentanyl 25 mcg IV; Moderate Sedation Time:  18 minutes The patient was continuously monitored during the procedure by the interventional radiology nurse under my direct supervision. FLUOROSCOPY TIME:  12 seconds, 1 mGy COMPLICATIONS: None immediate. PROCEDURE: Informed consent was obtained for placement of a tunneled dialysis catheter. The patient was placed supine on the interventional table. Ultrasound confirmed a patent right internal jugular vein. Ultrasound images were obtained for documentation. The right neck and chest was prepped and draped in a sterile fashion. Maximal barrier sterile technique was utilized including caps, mask, sterile gowns, sterile gloves, sterile drape, hand hygiene and skin antiseptic. The right neck was anesthetized with 1% lidocaine. A small incision was made with #11 blade scalpel. A 21 gauge needle directed into the right internal jugular vein with ultrasound guidance. A micropuncture dilator set was placed. A 19 cm tip to cuff Palindrome catheter was selected. The skin below the right clavicle was anesthetized and a small incision was made with an #11 blade scalpel. A subcutaneous tunnel was  formed to the vein dermatotomy site. The catheter was brought through the tunnel. The vein dermatotomy site was dilated to accommodate a peel-away sheath. The catheter was placed through the peel-away sheath and directed into the central venous structures. The tip of the catheter was placed at the superior cavoatrial junction with fluoroscopy. Fluoroscopic images were obtained for documentation. Both lumens were found to aspirate and flush well. The proper amount of heparin was flushed in both lumens. The vein dermatotomy site was closed using a single layer of absorbable suture and Dermabond. Surgifoam was placed in subcutaneous tract. The catheter was secured to the skin using Prolene suture. IMPRESSION: Successful placement of a right jugular tunneled dialysis catheter using ultrasound and fluoroscopic guidance. Electronically Signed   By: Markus Daft M.D.   On: 09/29/2020 17:35   IR US Guide Vasc Access Right  Result Date: 09/29/2020 INDICATION: 55 year old with chronic kidney disease and needs a hemodialysis catheter. EXAM: FLUOROSCOPIC AND ULTRASOUND GUIDED PLACEMENT OF A TUNNELED DIALYSIS CATHETER Physician: Stephan Minister. Anselm Pancoast,  MD MEDICATIONS: Ancef 1 g; The antibiotic was administered within an appropriate time interval prior to skin puncture. ANESTHESIA/SEDATION: Versed 1.0 mg IV; Fentanyl 25 mcg IV; Moderate Sedation Time:  18 minutes The patient was continuously monitored during the procedure by the interventional radiology nurse under my direct supervision. FLUOROSCOPY TIME:  12 seconds, 1 mGy COMPLICATIONS: None immediate. PROCEDURE: Informed consent was obtained for placement of a tunneled dialysis catheter. The patient was placed supine on the interventional table. Ultrasound confirmed a patent right internal jugular vein. Ultrasound images were obtained for documentation. The right neck and chest was prepped and draped in a sterile fashion. Maximal barrier sterile technique was utilized including caps,  mask, sterile gowns, sterile gloves, sterile drape, hand hygiene and skin antiseptic. The right neck was anesthetized with 1% lidocaine. A small incision was made with #11 blade scalpel. A 21 gauge needle directed into the right internal jugular vein with ultrasound guidance. A micropuncture dilator set was placed. A 19 cm tip to cuff Palindrome catheter was selected. The skin below the right clavicle was anesthetized and a small incision was made with an #11 blade scalpel. A subcutaneous tunnel was formed to the vein dermatotomy site. The catheter was brought through the tunnel. The vein dermatotomy site was dilated to accommodate a peel-away sheath. The catheter was placed through the peel-away sheath and directed into the central venous structures. The tip of the catheter was placed at the superior cavoatrial junction with fluoroscopy. Fluoroscopic images were obtained for documentation. Both lumens were found to aspirate and flush well. The proper amount of heparin was flushed in both lumens. The vein dermatotomy site was closed using a single layer of absorbable suture and Dermabond. Surgifoam was placed in subcutaneous tract. The catheter was secured to the skin using Prolene suture. IMPRESSION: Successful placement of a right jugular tunneled dialysis catheter using ultrasound and fluoroscopic guidance. Electronically Signed   By: Markus Daft M.D.   On: 09/29/2020 17:35   DG Chest Port 1 View  Result Date: 10/03/2020 CLINICAL DATA:  Shortness of breath EXAM: PORTABLE CHEST 1 VIEW COMPARISON:  09/26/2020 FINDINGS: Cardiac shadow is enlarged but stable. Dialysis catheter is now seen in satisfactory position on the right. Lungs are well aerated bilaterally. Resolution of previously seen basilar opacities. No new focal abnormality is noted. IMPRESSION: No acute abnormality seen. Electronically Signed   By: Inez Catalina M.D.   On: 10/03/2020 14:59   ECHOCARDIOGRAM COMPLETE  Result Date: 09/27/2020     ECHOCARDIOGRAM REPORT   Patient Name:   HUDSYN TRAW Date of Exam: 09/27/2020 Medical Rec #:  [id number]    Height:       62.0 in Accession #:    [id number]   Weight:       140.0 lb Date of Birth:  1966/04/01   BSA:          1.643 m Patient Age:    87 years     BP:           143/62 mmHg Patient Gender: F            HR:           86 bpm. Exam Location:  Inpatient Procedure: 2D Echo, Cardiac Doppler and Color Doppler Indications:    Pericardial Effusion  History:        Patient has prior history of Echocardiogram examinations, most                 recent 06/11/2020.  Sonographer:    Merrie Roof RDCS Referring Phys: [place]  1. Left ventricular ejection fraction, by estimation, is 70 to 75%. The left ventricle has hyperdynamic function. The left ventricle has no regional wall motion abnormalities. Left ventricular diastolic function could not be evaluated.  2. Right ventricular systolic function is normal. The right ventricular size is normal. There is mildly elevated pulmonary artery systolic pressure. The estimated right ventricular systolic pressure is 123456 mmHg.  3. A small pericardial effusion is present. The pericardial effusion is circumferential. There is no evidence of cardiac tamponade.  4. The mitral valve is normal in structure. No evidence of mitral valve regurgitation. No evidence of mitral stenosis.  5. The aortic valve is tricuspid. Aortic valve regurgitation is not visualized. Mild aortic valve sclerosis is present, with no evidence of aortic valve stenosis.  6. The inferior vena cava is normal in size with greater than 50% respiratory variability, suggesting right atrial pressure of 3 mmHg. FINDINGS  Left Ventricle: Left ventricular ejection fraction, by estimation, is 70 to 75%. The left ventricle has hyperdynamic function. The left ventricle has no regional wall motion abnormalities. The left ventricular internal cavity size was normal in size. There is no left ventricular  hypertrophy. Left ventricular diastolic function could not be evaluated. Right Ventricle: The right ventricular size is normal. No increase in right ventricular wall thickness. Right ventricular systolic function is normal. There is mildly elevated pulmonary artery systolic pressure. The tricuspid regurgitant velocity is 2.94  m/s, and with an assumed right atrial pressure of 3 mmHg, the estimated right ventricular systolic pressure is 123456 mmHg. Left Atrium: Left atrial size was normal in size. Right Atrium: Right atrial size was normal in size. Pericardium: A small pericardial effusion is present. The pericardial effusion is circumferential. There is no evidence of cardiac tamponade. Mitral Valve: The mitral valve is normal in structure. No evidence of mitral valve regurgitation. No evidence of mitral valve stenosis. Tricuspid Valve: The tricuspid valve is normal in structure. Tricuspid valve regurgitation is trivial. No evidence of tricuspid stenosis. Aortic Valve: The aortic valve is tricuspid. Aortic valve regurgitation is not visualized. Mild aortic valve sclerosis is present, with no evidence of aortic valve stenosis. Aortic valve mean gradient measures 8.0 mmHg. Aortic valve peak gradient measures 13.8 mmHg. Aortic valve area, by VTI measures 1.96 cm. Pulmonic Valve: The pulmonic valve was normal in structure. Pulmonic valve regurgitation is not visualized. No evidence of pulmonic stenosis. Aorta: The aortic root is normal in size and structure. Venous: The inferior vena cava is normal in size with greater than 50% respiratory variability, suggesting right atrial pressure of 3 mmHg. IAS/Shunts: No atrial level shunt detected by color flow Doppler.  LEFT VENTRICLE PLAX 2D LVIDd:         4.10 cm  Diastology LVIDs:         2.30 cm  LV e' medial:  6.96 cm/s LV PW:         1.30 cm  LV e' lateral: 8.49 cm/s LV IVS:        1.00 cm LVOT diam:     1.90 cm LV SV:         66 LV SV Index:   40 LVOT Area:     2.84 cm   RIGHT VENTRICLE          IVC RV Basal diam:  3.10 cm  IVC diam: 1.20 cm LEFT ATRIUM  Index       RIGHT ATRIUM           Index LA diam:        3.50 cm 2.13 cm/m  RA Area:     15.10 cm LA Vol (A2C):   63.2 ml 38.47 ml/m RA Volume:   37.60 ml  22.89 ml/m LA Vol (A4C):   50.5 ml 30.74 ml/m LA Biplane Vol: 57.1 ml 34.76 ml/m  AORTIC VALVE AV Area (Vmax):    2.06 cm AV Area (Vmean):   1.99 cm AV Area (VTI):     1.96 cm AV Vmax:           186.00 cm/s AV Vmean:          128.000 cm/s AV VTI:            0.338 m AV Peak Grad:      13.8 mmHg AV Mean Grad:      8.0 mmHg LVOT Vmax:         135.00 cm/s LVOT Vmean:        90.000 cm/s LVOT VTI:          0.233 m LVOT/AV VTI ratio: 0.69  AORTA Ao Root diam: 3.00 cm Ao Asc diam:  2.55 cm TRICUSPID VALVE TR Peak grad:   34.6 mmHg TR Vmax:        294.00 cm/s  SHUNTS Systemic VTI:  0.23 m Systemic Diam: 1.90 cm Fransico Him MD Electronically signed by Fransico Him MD Signature Date/Time: 09/27/2020/6:32:09 PM    Final    DG HIP UNILAT WITH PELVIS 1V RIGHT  Result Date: 10/03/2020 CLINICAL DATA:  Right hip pain. EXAM: DG HIP (WITH OR WITHOUT PELVIS) 1V RIGHT COMPARISON:  None. FINDINGS: There is no evidence of hip fracture or dislocation. There is no evidence of arthropathy or other focal bone abnormality. IMPRESSION: Negative. Electronically Signed   By: Marijo Conception M.D.   On: 10/03/2020 21:09   VAS [place] LOWER EXTREMITY VENOUS (DVT) (ONLY [facility] & [facility] 7a-7p)  Result Date: 09/26/2020  Lower Venous DVT Study Patient Name:  ALEXAH GOIKE  Date of Exam:   09/26/2020 Medical Rec #: [id number]     Accession #:    [id number] Date of Birth: 08/22/1965    Patient Gender: F Patient Age:   54Y Exam Location:  [facility] Procedure:      VAS [place] LOWER EXTREMITY VENOUS (DVT) Referring Phys: [id number] LAURA A MURPHY --------------------------------------------------------------------------------  Indications: Edema.  Comparison Study: No previous exams Performing  Technologist: Rogelia Rohrer  Examination Guidelines: A complete evaluation includes B-mode imaging, spectral Doppler, color Doppler, and power Doppler as needed of all accessible portions of each vessel. Bilateral testing is considered an integral part of a complete examination. Limited examinations for reoccurring indications may be performed as noted. The reflux portion of the exam is performed with the patient in reverse Trendelenburg.  +---------+---------------+---------+-----------+----------+--------------+ RIGHT    CompressibilityPhasicitySpontaneityPropertiesThrombus Aging +---------+---------------+---------+-----------+----------+--------------+ CFV      Full           Yes      Yes                                 +---------+---------------+---------+-----------+----------+--------------+ SFJ      Full                                                        +---------+---------------+---------+-----------+----------+--------------+  FV Prox  Full           Yes      Yes                                 +---------+---------------+---------+-----------+----------+--------------+ FV Mid   Full           Yes      Yes                                 +---------+---------------+---------+-----------+----------+--------------+ FV DistalFull           Yes      Yes                                 +---------+---------------+---------+-----------+----------+--------------+ PFV      Full                                                        +---------+---------------+---------+-----------+----------+--------------+ POP      Full           Yes      Yes                                 +---------+---------------+---------+-----------+----------+--------------+ PTV      Full                                                        +---------+---------------+---------+-----------+----------+--------------+ PERO     Full                                                         +---------+---------------+---------+-----------+----------+--------------+   +----+---------------+---------+-----------+----------+--------------+ LEFTCompressibilityPhasicitySpontaneityPropertiesThrombus Aging +----+---------------+---------+-----------+----------+--------------+ CFV Full           Yes      Yes                                 +----+---------------+---------+-----------+----------+--------------+     Summary: RIGHT: - There is no evidence of deep vein thrombosis in the lower extremity. - There is no evidence of superficial venous thrombosis.  - No cystic structure found in the popliteal fossa. Subcutaneous edema from mid thigh to ankle  LEFT: - No evidence of common femoral vein obstruction.  *See table(s) above for measurements and observations. Electronically signed by Servando Snare MD on 09/26/2020 at 4:55:05 PM.    Final    VAS [place] UPPER EXT VEIN MAPPING (PRE-OP AVF)  Result Date: 09/29/2020 UPPER EXTREMITY VEIN MAPPING Patient Name:  GENEVIE FEURTADO  Date of Exam:   09/29/2020 Medical Rec #: [id number]     Accession #:    [id number] Date of Birth: Nov 16, 1965    Patient Gender: F Patient Age:  54Y Exam Location:  [facility] Procedure:      VAS [place] UPPER EXT VEIN MAPPING (PRE-OP AVF) Referring Phys: [id number] KELLIE GOLDSBOROUGH --------------------------------------------------------------------------------  Indications: Pre-access. Comparison Study: no prior Performing Technologist: Abram Sander RVS  Examination Guidelines: A complete evaluation includes B-mode imaging, spectral Doppler, color Doppler, and power Doppler as needed of all accessible portions of each vessel. Bilateral testing is considered an integral part of a complete examination. Limited examinations for reoccurring indications may be performed as noted. +-----------------+-------------+----------+--------+ Right Cephalic   Diameter (cm)Depth (cm)Findings  +-----------------+-------------+----------+--------+ Shoulder             0.22        1.35            +-----------------+-------------+----------+--------+ Prox upper arm       0.20        1.28            +-----------------+-------------+----------+--------+ Mid upper arm        0.21        1.37            +-----------------+-------------+----------+--------+ Dist upper arm       0.10        0.47            +-----------------+-------------+----------+--------+ Antecubital fossa    0.16        0.25            +-----------------+-------------+----------+--------+ Prox forearm         0.21        0.82            +-----------------+-------------+----------+--------+ Mid forearm          0.22        0.76            +-----------------+-------------+----------+--------+ Dist forearm         0.22        0.38            +-----------------+-------------+----------+--------+ Wrist                0.23        0.32            +-----------------+-------------+----------+--------+ +-----------------+-------------+----------+--------+ Right Basilic    Diameter (cm)Depth (cm)Findings +-----------------+-------------+----------+--------+ Prox upper arm       0.35        0.99            +-----------------+-------------+----------+--------+ Mid upper arm        0.27        1.19            +-----------------+-------------+----------+--------+ Dist upper arm       0.29        1.26            +-----------------+-------------+----------+--------+ Antecubital fossa    0.21        1.03            +-----------------+-------------+----------+--------+ Prox forearm         0.21        0.76            +-----------------+-------------+----------+--------+ Mid forearm          0.20        0.46            +-----------------+-------------+----------+--------+ Distal forearm       0.21        0.36            +-----------------+-------------+----------+--------+  Wrist  0.18        0.39            +-----------------+-------------+----------+--------+ +-----------------+-------------+----------+---------+ Left Cephalic    Diameter (cm)Depth (cm)Findings  +-----------------+-------------+----------+---------+ Shoulder             0.31        1.02             +-----------------+-------------+----------+---------+ Prox upper arm       0.24        1.07             +-----------------+-------------+----------+---------+ Mid upper arm        0.13        1.23   branching +-----------------+-------------+----------+---------+ Dist upper arm       0.13        1.05             +-----------------+-------------+----------+---------+ Antecubital fossa    0.20        2.97             +-----------------+-------------+----------+---------+ Prox forearm         0.18        0.65             +-----------------+-------------+----------+---------+ Mid forearm          0.24        0.52             +-----------------+-------------+----------+---------+ Dist forearm         0.22        0.34             +-----------------+-------------+----------+---------+ +-----------------+-------------+----------+---------+ Left Basilic     Diameter (cm)Depth (cm)Findings  +-----------------+-------------+----------+---------+ Prox upper arm       0.38        1.48             +-----------------+-------------+----------+---------+ Mid upper arm        0.25        1.42             +-----------------+-------------+----------+---------+ Dist upper arm       0.27        1.18             +-----------------+-------------+----------+---------+ Antecubital fossa    0.26        0.68             +-----------------+-------------+----------+---------+ Prox forearm         0.22        0.30             +-----------------+-------------+----------+---------+ Mid forearm          0.18        0.23              +-----------------+-------------+----------+---------+ Distal forearm       0.19        0.28             +-----------------+-------------+----------+---------+ Elbow                                   branching +-----------------+-------------+----------+---------+ *See table(s) above for measurements and observations.  Diagnosing physician: Monica Martinez MD Electronically signed by Monica Martinez MD on 09/29/2020 at 4:45:25 PM.    Final        LAB RESULTS: Basic Metabolic Panel: Recent Labs  Lab 10/17/20 0135 10/18/20 0156  NA 132* 131*  K 3.5 4.0  CL  97* 94*  CO2 28 26  GLUCOSE 112* 135*  BUN 19 29*  CREATININE 5.49* 6.61*  CALCIUM 8.7* 8.6*  PHOS 4.5 5.5*   Liver Function Tests: Recent Labs  Lab 10/17/20 0135 10/18/20 0156  ALBUMIN 2.6* 2.6*   No results for input(s): LIPASE, AMYLASE in the last 168 hours. No results for input(s): AMMONIA in the last 168 hours. CBC: Recent Labs  Lab 10/15/20 1500 10/18/20 1259  WBC 8.6 7.8  HGB 9.8* 9.0*  HCT 31.7* 29.2*  MCV 94.9 97.0  PLT 405* 357   Cardiac Enzymes: No results for input(s): CKTOTAL, CKMB, CKMBINDEX, TROPONINI in the last 168 hours. BNP: Invalid input(s): POCBNP CBG: Recent Labs  Lab 10/18/20 0606 10/18/20 1109  GLUCAP 76 117*       Disposition and Follow-up: Discharge Instructions    Ambulatory referral to Neurology   Complete by: As directed    An appointment is requested in approximately:Dr Sethi regarding MS? And post stroke follow up form Feb 2022, did not get to do follow up since d/c from [facility].       DISPOSITION: Inpatient rehab   DISCHARGE FOLLOW-UP  Follow-up Information    [facility] In 6 weeks.   Specialty: Vascular Surgery Why: Office will call you to arrange your appt (sent) Contact information: [address] [place] [phone]       Garvin Fila, MD. Call in 3 day(s).   Specialties:  Neurology, Radiology Contact information: [address] [place] [address] [phone]        Vernie Shanks, MD Follow up in 1 week(s).   Specialty: Family Medicine Contact information: [place] [address] [phone]                Time coordinating discharge:  35 minutes  Signed:   Estill Cotta M.D. [facility] 10/18/2020, 2:15 PM

## 2020-10-19 DIAGNOSIS — R5381 Other malaise: Secondary | ICD-10-CM | POA: Diagnosis not present

## 2020-10-19 LAB — RENAL FUNCTION PANEL
Albumin: 2.9 g/dL — ABNORMAL LOW (ref 3.5–5.0)
Anion gap: 8 (ref 5–15)
BUN: 13 mg/dL (ref 6–20)
CO2: 29 mmol/L (ref 22–32)
Calcium: 9.1 mg/dL (ref 8.9–10.3)
Chloride: 96 mmol/L — ABNORMAL LOW (ref 98–111)
Creatinine, Ser: 3.68 mg/dL — ABNORMAL HIGH (ref 0.44–1.00)
GFR, Estimated: 14 mL/min — ABNORMAL LOW (ref >60)
Glucose, Bld: 55 mg/dL — ABNORMAL LOW (ref 70–99)
Phosphorus: 3.8 mg/dL (ref 2.5–4.6)
Potassium: 4 mmol/L (ref 3.5–5.1)
Sodium: 133 mmol/L — ABNORMAL LOW (ref 135–145)

## 2020-10-19 LAB — GLUCOSE, CAPILLARY
Glucose-Capillary: 64 mg/dL — ABNORMAL LOW (ref 70–99)
Glucose-Capillary: 88 mg/dL (ref 70–99)
Glucose-Capillary: 317 mg/dL — ABNORMAL HIGH (ref 70–99)
Glucose-Capillary: 123 mg/dL — ABNORMAL HIGH (ref 70–99)
Glucose-Capillary: 206 mg/dL — ABNORMAL HIGH (ref 70–99)

## 2020-10-19 MED ORDER — INSULIN GLARGINE 100 UNIT/ML ~~LOC~~ SOLN
9.0000 [IU] | Freq: Every day | SUBCUTANEOUS | Status: DC
  Administered 2020-10-20: 9 [IU] via SUBCUTANEOUS
  Filled 2020-10-19 (×3): qty 0.09

## 2020-10-19 NOTE — Evaluation (Signed)
Occupational Therapy Assessment and Plan  Patient Details  Name: Hannah [name] MRN: [id number] Date of Birth: 01-10-1966  OT Diagnosis: abnormal posture, disturbance of vision, hemiplegia affecting dominant side, muscle weakness (generalized) and swelling of limb Rehab Potential: Rehab Potential (ACUTE ONLY): Good ELOS: 12-14 days   Today's Date: 10/19/2020 OT Individual Time: [id number] and 1450-1534 OT Individual Time Calculation (min): 60 min  And 44 min  Hospital Problem: Active Problems:   Debility   Past Medical History:  Past Medical History:  Diagnosis Date  . Cerebral infarction ([place]) 07/12/2017   2009 first one, has had a total of 4 stokes  . Diabetes type 2, controlled ([place])   . Hemiparesis affecting right side as late effect of cerebrovascular accident (CVA) ([place])   . Hyperlipidemia   . Hypertension   . Multiple sclerosis ([place]) 2009  . Stroke [facility])    Past Surgical History:  Past Surgical History:  Procedure Laterality Date  . APPENDECTOMY    . AV FISTULA PLACEMENT Right 10/02/2020   Procedure: RIGHT ARTERIOVENOUS (AV) FISTULA CREATION;  Surgeon: Waynetta Sandy, MD;  Location: [place];  Service: Vascular;  Laterality: Right;  . CYSTOSTOMY W/ BLADDER BIOPSY    . IR FLUORO GUIDE CV LINE RIGHT  09/29/2020  . IR US GUIDE VASC ACCESS RIGHT  09/29/2020  . OVARIAN CYST SURGERY    . stent placed in heart      Assessment & Plan Clinical Impression: Hannah [name] is a 55 year old female with history of multiple strokes with residual right spastic HP, MS (no treatment), T2DM with progressive nephropathy, multiple recent admissions for acute on chronic renal failure and was admitted on 09/26/20 with fluid overload, malaise and uremia. She was started on IV diuresis with good results but continued to have uremic symptoms. Low iron stores supplemented and she ws started on aranesp for significant anemia due to chronic disease. Dialysis recommended by Dr. Moshe Cipro and  after multiple discussions, patient was agreeable to have IJ placed by Dr. Anselm Pancoast on 05/16. MWF HD initiated and she had RUE AV fistula placed by Dr. Donzetta Matters on 05/19. She reported LB and right thigh/flank pain with spasms and was found to have spontaneous right psoas hematoma on 05/23. Dr. Donne Hazel consulted for input and recommended holding Plavix X 2 weeks. She continued to be limited by pain and weakness. CIR recommended due to recent functional decline.  Patient currently requires min-mod with basic self-care skills secondary to muscle weakness and muscle joint tightness, decreased cardiorespiratoy endurance, abnormal tone, unbalanced muscle activation and decreased coordination, decreased visual acuity and decreased standing balance, decreased postural control and hemiplegia.  Prior to hospitalization, patient could complete BADLs with supervision-min A.   Patient will benefit from skilled intervention to increase independence with basic self-care skills prior to discharge home with support of 2 daughters.  Anticipate patient will require 24 hour supervision and minimal physical assistance and follow up home health.  OT - End of Session Endurance Deficit: Yes Endurance Deficit Description: Pt with limited standing endurance, asked to defer functional ambulation during OT eval due to fatigue OT Assessment Rehab Potential (ACUTE ONLY): Good OT Barriers to Discharge: Hemodialysis OT Patient demonstrates impairments in the following area(s): Balance;Vision;Edema;Endurance;Motor;Safety (mild Rt thigh edema from psoas hematoma) OT Basic ADL's Functional Problem(s): Bathing;Dressing;Toileting OT Advanced ADL's Functional Problem(s): Light Housekeeping OT Transfers Functional Problem(s): Toilet;Tub/Shower OT Additional Impairment(s): Fuctional Use of Upper Extremity (Rt UE, mild) OT Plan OT Intensity: Minimum of 1-2 x/day, 45 to 90 minutes  OT Frequency: 5 out of 7 days OT Duration/Estimated Length of  Stay: 12-14 days OT Treatment/Interventions: Balance/vestibular training;Disease mangement/prevention;Neuromuscular re-education;Self Care/advanced ADL retraining;Therapeutic Exercise;Wheelchair propulsion/positioning;UE/LE Strength taining/ROM;Pain management;DME/adaptive equipment instruction;Cognitive remediation/compensation;Community reintegration;Functional electrical stimulation;Patient/family education;Splinting/orthotics;UE/LE Coordination activities;Visual/perceptual remediation/compensation;Therapeutic Activities;Psychosocial support;Functional mobility training;Discharge planning OT Self Feeding Anticipated Outcome(s): No goal OT Basic Self-Care Anticipated Outcome(s): Supervision OT Toileting Anticipated Outcome(s): Supervision OT Bathroom Transfers Anticipated Outcome(s): CGA OT Recommendation Recommendations for Other Services: Therapeutic Recreation consult Therapeutic Recreation Interventions: Pet therapy;Outing/community reintergration Patient destination: Home Follow Up Recommendations: Home health OT Equipment Recommended: To be determined   OT Evaluation Precautions/Restrictions  Precautions Precautions: Fall Precaution Comments: HD access port RUQ, AV graft maturing Rt UE, spastic Rt hemiplegia at baseline   Home Living/Prior [place] expects to be discharged to:: Private residence Available Help at Discharge: Family,Available 24 hours/day (daughters) Type of Home: Apartment Home Access: Level entry Home Layout: One level Bathroom Shower/Tub: Tub/shower unit (+TTB) Bathroom Toilet: Handicapped height (uses 3:1 over toilet) Bathroom Accessibility: Yes  Lives With: Daughter (pt reports living with 1 daughter and the other daughter lives next door) IADL History Homemaking Responsibilities: No Leisure and Hobbies: Reading mystery novels + playing with her grandchildren IADL Comments: Daughters took care of IADL responsibilities  PTA Prior Function Level of Independence: Requires assistive device for independence,Needs assistance with tranfers,Needs assistance with ADLs (Pt reports having supervision assistance for ADLs PTA, min guard for functional transfers using RW)  Able to Take Stairs?: Yes Driving: No Vision Baseline Vision/History: No visual deficits Patient Visual Report: Blurring of vision (and "tunnel vision") Vision Assessment?: Vision impaired- to be further tested in functional context Additional Comments: She had a difficult time locating ADL/grooming items that were in front of her and easy to see, most evidently noted when there was low contrast between object/surface Perception  Perception: Within Functional Limits Praxis Praxis: Intact Cognition Overall Cognitive Status: Within Functional Limits for tasks assessed Arousal/Alertness: Awake/alert Orientation Level: Person;Place;Situation Person: Oriented Place: Oriented Situation: Oriented Year: 2022 Month: June Day of Week: Correct Immediate Memory Recall: Sock;Blue;Bed Memory Recall Sock: With Cue Memory Recall Blue: Without Cue Memory Recall Bed: Without Cue Awareness: Appears intact Safety/Judgment: Appears intact Sensation Sensation Light Touch: Impaired Detail Light Touch Impaired Details: Impaired RLE (reports numbness area of hip/thigh area) Proprioception: Appears Intact Coordination Gross Motor Movements are Fluid and Coordinated: No Fine Motor Movements are Fluid and Coordinated: No Coordination and Movement Description: Affected by residual spastic Rt hemplegia Finger Nose Finger Test: Decreased speed + accuracy Rt >Lt Motor  Motor Motor: Hemiplegia;Abnormal tone;Abnormal postural alignment and control Motor - Skilled Clinical Observations: generalized debility, residual R hemiparesis/spasticity, pain limiting  Trunk/Postural Assessment  Cervical Assessment Cervical Assessment: Exceptions to [facility] (forward  head) Thoracic Assessment Thoracic Assessment: Exceptions to [facility] (rounded shoulders) Lumbar Assessment Lumbar Assessment: Exceptions to [facility] (posterior pelvic tilt) Postural Control Postural Control: Deficits on evaluation (limited in standing while engaging in functional tasks)  Balance Balance Balance Assessed: Yes Static Sitting Balance Static Sitting - Level of Assistance: 5: Stand by assistance Dynamic Sitting Balance Dynamic Sitting - Balance Support: During functional activity Dynamic Sitting - Level of Assistance: 5: Stand by assistance (washing feet utilizing figure 4 position EOB) Static Standing Balance Static Standing - Level of Assistance: 4: Min assist Dynamic Standing Balance Dynamic Standing - Balance Support: During functional activity;No upper extremity supported Dynamic Standing - Level of Assistance: 4: Min assist Dynamic Standing - Balance Activities: Lateral lean/weight shifting;Forward lean/weight shifting (Elevating pants over hips) Extremity/Trunk Assessment  RUE Assessment RUE Assessment: Exceptions to [facility] Active Range of Motion (AROM) Comments: Shoulder ROM limited to ~150 degrees, limited forearm supination General Strength Comments: spasticity at baseline due to CVA hx LUE Assessment LUE Assessment: Within Functional Limits Active Range of Motion (AROM) Comments: WNL  Care Tool Care Tool Self Care Eating   Eating Assist Level: Set up assist    Oral Care     setup assist    Bathing   Body parts bathed by patient: Right arm;Left arm;Chest;Abdomen;Front perineal area;Buttocks;Right upper leg;Left upper leg;Right lower leg;Left lower leg;Face     Assist Level: Contact Guard/Touching assist    Upper Body Dressing(including orthotics)   What is the patient wearing?: Pull over shirt   Assist Level: Supervision/Verbal cueing    Lower Body Dressing (excluding footwear)   What is the patient wearing?: Pants;Underwear/pull up Assist for lower body  dressing: Minimal Assistance - Patient > 75%    Putting on/Taking off footwear   What is the patient wearing?: Non-skid slipper socks Assist for footwear: Supervision/Verbal cueing       Care Tool Toileting Toileting activity Toileting Activity did not occur (Clothing management and hygiene only): N/A (no void or bm)       Care Tool Bed Mobility Roll left and right activity   Roll left and right assist level: Minimal Assistance - Patient > 75%    Sit to lying activity   Sit to lying assist level: Minimal Assistance - Patient > 75%    Lying to sitting edge of bed activity   Lying to sitting edge of bed assist level: Minimal Assistance - Patient > 75%     Care Tool Transfers Sit to stand transfer   Sit to stand assist level: Minimal Assistance - Patient > 75%    Chair/bed transfer   Chair/bed transfer assist level: Minimal Assistance - Patient > 75%     Toilet transfer         Care Tool Cognition Expression of Ideas and Wants Expression of Ideas and Wants: Without difficulty (complex and basic) - expresses complex messages without difficulty and with speech that is clear and easy to understand   Understanding Verbal and Non-Verbal Content Understanding Verbal and Non-Verbal Content: Understands (complex and basic) - clear comprehension without cues or repetitions   Memory/Recall Ability *first 3 days only Memory/Recall Ability *first 3 days only: Current season;Staff names and faces;That he or she is in a hospital/hospital unit    Refer to Care Plan for [place] 1 OT Short Term Goal 1 (Week 1): Pt will complete LB dressing with CGA at sit<stand level using LRAD as needed OT Short Term Goal 2 (Week 1): Pt will complete an ambulatory toilet transfer with no more than Min A while using LRAD OT Short Term Goal 3 (Week 1): Pt will complete an ambulatory shower transfer with no more than Min A while using LRAD  Recommendations for other services:  Therapeutic Recreation  Pet therapy and Outing/community reintegration   Skilled Therapeutic Intervention Skilled OT session completed with focus on initial evaluation, education on OT role/POC, and establishment of patient-centered goals.   Pt greeted in bed with no c/o pain. Agreeable to engage in ADLs today, sitting EOB at sit<stand level without AD. Pt able to use her affected Rt UE functionally at nondominant level, though did overcompensate with Lt at times (I.e. elevating clothing over hips in standing). Min A for sit<stands and for dynamic standing balance. Noticed  some visual deficits with pt unable to locate a few ADL + grooming items which she should have easily been able to see without assistance. Attempted ambulatory transfer to the bathroom toilet for simulated practice with pt ambulating ~5 ft using RW, had 2 small LOBs with OT providing Mod A for recovery, noted increased in Rt sided spasticity also so opted to return to bed for safety. Pt reports Rt sided spasticity has worsened in standing since this hospital admission. She returned to bed at end of session, all needs within reach and bed alarm set.  2nd Session 1:1 tx (44 min) Pt greeted in bed with no c/o pain. ADL needs presently met and she reported that she had just returned to bed after sitting up in the chair for a good deal of time. When OT suggested that we engage in [place] during session, her face brightened and she was agreeable to get OOB. Min A for stand pivot<w/c using the RW. She was then escorted to the dayroom. Worked on cardiopulmonary endurance, general strengthening, and dynamic sitting/standing balance while pt engaged in dancing in sitting and standing positions. Min balance assist while using the RW with bilateral UE support, swaying hips in beat to music. She also performed mini squats. At end of session pt was returned to the room, left sitting up in the w/c with all needs within reach and safety belt  fastened.   ADL ADL Eating: Not assessed Grooming: Setup Where Assessed-Grooming: Edge of bed Upper Body Bathing: Supervision/safety Where Assessed-Upper Body Bathing: Edge of bed Lower Body Bathing: Contact guard Where Assessed-Lower Body Bathing: Edge of bed Upper Body Dressing: Supervision/safety Where Assessed-Upper Body Dressing: Edge of bed Lower Body Dressing: Minimal assistance Where Assessed-Lower Body Dressing: Edge of bed Toileting: Not assessed Toilet Transfer: Not assessed Tub/Shower Transfer: Not assessed Mobility  Bed Mobility Bed Mobility: Rolling Left;Supine to Sit;Sit to Supine Rolling Left: Minimal Assistance - Patient > 75% Supine to Sit: Minimal Assistance - Patient > 75% Sit to Supine: Minimal Assistance - Patient > 75% Transfers Sit to Stand: Minimal Assistance - Patient > 75% Stand to Sit: Minimal Assistance - Patient > 75%   Discharge Criteria: Patient will be discharged from OT if patient refuses treatment 3 consecutive times without medical reason, if treatment goals not met, if there is a change in medical status, if patient makes no progress towards goals or if patient is discharged from hospital.  The above assessment, treatment plan, treatment alternatives and goals were discussed and mutually agreed upon: by patient  Skeet Simmer 10/19/2020, 12:28 PM

## 2020-10-19 NOTE — Progress Notes (Addendum)
At 2207, patient refused full dose of scheduled lantus, agreed to take 10 units. "15 units always makes my blood sugar drop."    +/- sleep. PRN benadryl given at 2031, for itching and "withdrawls". PRN percocet given at 2207 for complaint of pain to right hip and right flank pain. Right hip swollen from hematoma. At 0012, PRN robaxin given for complaint of right hand spasms. Slept from [place]. No void this shift. HD cath in place to right chest. Right AVG maturing in RUE, OTA, + bruit & thrill. Patient asking when eliquis will be restarted. Patrici Ranks A

## 2020-10-19 NOTE — Plan of Care (Signed)
  Problem: Sit to Stand Goal: LTG:  Patient will perform sit to stand in prep for activites of daily living with assistance level (OT) Description: LTG:  Patient will perform sit to stand in prep for activites of daily living with assistance level (OT) Flowsheets (Taken 10/19/2020 1244) LTG: PT will perform sit to stand in prep for activites of daily living with assistance level: Supervision/Verbal cueing   Problem: RH Bathing Goal: LTG Patient will bathe all body parts with assist levels (OT) Description: LTG: Patient will bathe all body parts with assist levels (OT) Flowsheets (Taken 10/19/2020 1244) LTG: Pt will perform bathing with assistance level/cueing: Supervision/Verbal cueing   Problem: RH Dressing Goal: LTG Patient will perform upper body dressing (OT) Description: LTG Patient will perform upper body dressing with assist, with/without cues (OT). Flowsheets (Taken 10/19/2020 1244) LTG: Pt will perform upper body dressing with assistance level of: Set up assist Goal: LTG Patient will perform lower body dressing w/assist (OT) Description: LTG: Patient will perform lower body dressing with assist, with/without cues in positioning using equipment (OT) Flowsheets (Taken 10/19/2020 1244) LTG: Pt will perform lower body dressing with assistance level of: Supervision/Verbal cueing   Problem: RH Toileting Goal: LTG Patient will perform toileting task (3/3 steps) with assistance level (OT) Description: LTG: Patient will perform toileting task (3/3 steps) with assistance level (OT)  Flowsheets (Taken 10/19/2020 1244) LTG: Pt will perform toileting task (3/3 steps) with assistance level: Supervision/Verbal cueing   Problem: RH Toilet Transfers Goal: LTG Patient will perform toilet transfers w/assist (OT) Description: LTG: Patient will perform toilet transfers with assist, with/without cues using equipment (OT) Flowsheets (Taken 10/19/2020 1244) LTG: Pt will perform toilet transfers with assistance  level of: Contact Guard/Touching assist   Problem: RH Tub/Shower Transfers Goal: LTG Patient will perform tub/shower transfers w/assist (OT) Description: LTG: Patient will perform tub/shower transfers with assist, with/without cues using equipment (OT) Flowsheets (Taken 10/19/2020 1244) LTG: Pt will perform tub/shower stall transfers with assistance level of: Contact Guard/Touching assist

## 2020-10-19 NOTE — Progress Notes (Signed)
PROGRESS NOTE   Subjective/Complaints: Felt poorly after dialysis yesterday- felt too much fluid was taken off. She asked that her Lantus dose be reduced to 10U last night, and CBG was still low at 64 this morning. She drank orange juice and it jumped up to 372.   Objective:   No results found. Recent Labs    10/18/20 1259  WBC 7.8  HGB 9.0*  HCT 29.2*  PLT 357   Recent Labs    10/18/20 0156 10/19/20 0554  NA 131* 133*  K 4.0 4.0  CL 94* 96*  CO2 26 29  GLUCOSE 135* 55*  BUN 29* 13  CREATININE 6.61* 3.68*  CALCIUM 8.6* 9.1    Intake/Output Summary (Last 24 hours) at 10/19/2020 1512 Last data filed at 10/19/2020 1230 Gross per 24 hour  Intake 558 ml  Output 2000 ml  Net -1442 ml        Physical Exam: Vital Signs Blood pressure 134/64, pulse 86, temperature 99 F (37.2 C), temperature source Oral, resp. rate 17, weight 56.4 kg, SpO2 100 %. Gen: no distress, normal appearing HEENT: oral mucosa pink and moist, NCAT Cardio: Reg rate Chest: normal effort, normal rate of breathing Abd: soft, non-distended Ext: no edema Psych: pleasant, normal affect Skin: intact Neurological:     Mental Status: She is alert and oriented to person, place, and time.     Comments: Right facial weakness with  Mild dysarthria and expressive deficits. Right HP--unchanged per input.  Right upper extremity with 4/5 hand grip- strength otherwise intact. RLE with 4/5 proximally and 3/5 distally. Left sided strength intact   Assessment/Plan: 1. Functional deficits which require 3+ hours per day of interdisciplinary therapy in a comprehensive inpatient rehab setting.  Physiatrist is providing close team supervision and 24 hour management of active medical problems listed below.  Physiatrist and rehab team continue to assess barriers to discharge/monitor patient progress toward functional and medical goals  Care Tool:  Bathing     Body parts bathed by patient: Right arm,Left arm,Chest,Abdomen,Front perineal area,Buttocks,Right upper leg,Left upper leg,Right lower leg,Left lower leg,Face         Bathing assist Assist Level: Contact Guard/Touching assist     Upper Body Dressing/Undressing Upper body dressing   What is the patient wearing?: Pull over shirt    Upper body assist Assist Level: Supervision/Verbal cueing    Lower Body Dressing/Undressing Lower body dressing      What is the patient wearing?: Pants,Underwear/pull up     Lower body assist Assist for lower body dressing: Minimal Assistance - Patient > 75%     Toileting Toileting Toileting Activity did not occur (Clothing management and hygiene only): N/A (no void or bm)  Toileting assist Assist for toileting: Moderate Assistance - Patient 50 - 74%     Transfers Chair/bed transfer  Transfers assist     Chair/bed transfer assist level: Minimal Assistance - Patient > 75%     Locomotion Ambulation   Ambulation assist      Assist level: Minimal Assistance - Patient > 75% Assistive device: Walker-rolling Max distance: 35'   Walk 10 feet activity   Assist     Assist level:  Minimal Assistance - Patient > 75% Assistive device: Walker-rolling   Walk 50 feet activity   Assist Walk 50 feet with 2 turns activity did not occur: Safety/medical concerns (fatigue)         Walk 150 feet activity   Assist Walk 150 feet activity did not occur: Safety/medical concerns         Walk 10 feet on uneven surface  activity   Assist Walk 10 feet on uneven surfaces activity did not occur: Safety/medical concerns         Wheelchair     Assist Will patient use wheelchair at discharge?: Yes Type of Wheelchair:  (uses power chair at home sometimes on bad days)    Wheelchair assist level: Dependent - Patient 0%      Wheelchair 50 feet with 2 turns activity    Assist        Assist Level: Dependent - Patient 0%    Wheelchair 150 feet activity     Assist      Assist Level: Dependent - Patient 0%   Blood pressure 134/64, pulse 86, temperature 99 F (37.2 C), temperature source Oral, resp. rate 17, weight 56.4 kg, SpO2 100 %.   Medical Problem List and Plan: 1.  Cardiopulmonary debility             -patient may shower             -ELOS/Goals: 2-3 weeks MinA             -Initial CIR evals today 2.  Antithrombotics: -DVT/anticoagulation:  Mechanical: Sequential compression devices, below knee Bilateral lower extremities             -antiplatelet therapy: Plavix on hold until 6/9, discussed with patient.  3. Pain Management: Continue Oxycodone prn.              -on Tizanidine 2 mg tid as per home regimen.  Muscle relaxer's contraindicated with HD--->changed to 2 mg HS per recommendations.              --Continue low dose gabapentin.  4. Mood: LCSW to follow for evaluation and support.              -antipsychotic agents: N/A 5. Neuropsych: This patient is capable of making decisions on her own behalf. 6. Skin/Wound Care: Routine pressure relief measures.  7. Fluids/Electrolytes/Nutrition: Strict I/O.  8. ESRD: Now on HD MWF--schedule at the end of the day             --has been clipped for [facility] 9. HTN: Monitor BP--continue Norvasc, Coreg bid, apresoline tid and  10 Hyponatremia: SSRI d/c by nephrology with slow improved.  11. Nausea: Continue reglan bid-> wean to off? 12. Constipation: Managed with senna S bid and miralax daily.  13. Hypoglycemia: decrease Lantus HS to 9U.   LOS: 1 days A FACE TO FACE EVALUATION WAS PERFORMED  Martha Clan P Calton [place] 10/19/2020, 3:12 PM

## 2020-10-19 NOTE — Significant Event (Signed)
Hypoglycemic Event  CBG: 64  Treatment: 4 oz juice/soda  Symptoms: None  Follow-up CBG: [id number] CBG Result:88  Possible Reasons for Event: Unknown  Comments/MD notified:treated per protocol    Hannah [name]

## 2020-10-19 NOTE — Progress Notes (Addendum)
Inpatient Rehabilitation Medication Review by a Pharmacist  A complete drug regimen review was completed for this patient to identify any potential clinically significant medication issues.  Clinically significant medication issues were identified:  yes   Type of Medication Issue Identified Description of Issue Urgent (address now) Non-Urgent (address on AM team rounds) Plan Plan Accepted by Provider? (Yes / No / Pending AM Rounds)  Drug Interaction(s) (clinically significant)       Duplicate Therapy       Allergy       No Medication Administration End Date       Incorrect Dose  Patient on rosuvastatin 20 mg prior to admission. Received 10 mg while inpatient. 20 mg resumed at discharge. On 10 mg in CIR.  Non-urgent Determine appropriate dose of rosuvastatin - 10 mg vs 20 mg. Likely need high intensity dosing (20 mg) due to history of CVA. LDL well controlled at 56 on 20 mg prior to admission.    Additional Drug Therapy Needed  Plavix is currently being held d/t hematoma/anemia, discharge summary states to resume on 6/9. Has been appropriately held currently.  Non-urgent Follow up on 6/9 to resume Plavix if appropriate   Other  Patient was hypoglycemic this AM - Lantus 15 was re-ordered from inpatient orders, received 10 units last night at patient's preference. Glipizide on discharge summary list but not ordered in CIR (didn't receive inpatient)  Fluoxetine 20 mg also ordered on discharge meds. This was discontinued on 5/27 while inpatient by nephrology due to hyponatremia. No other medication started while inpatient or in CIR.  Non-urgent Do not recommend adding glipizide to current Lantus ordered given hypoglycemia today. Recommend adjusting dose of Lantus d/t hypoglycemia as appropriate (BG is now 317 though).   Follow up if therapy is needed/appropriate.      Name of provider notified for issues identified: Dr. Ranell Patrick  Provider Method of Notification: Secure chat  Time spent  performing this drug regimen review (minutes):  [place], PharmD PGY1 Pharmacy Resident 10/19/2020 12:15 PM  Please check [email or web address] for unit-specific pharmacy phone numbers.

## 2020-10-19 NOTE — Plan of Care (Signed)
  Problem: Consults Goal: RH GENERAL PATIENT EDUCATION Description: See Patient Education module for education specifics. Outcome: Progressing Goal: Skin Care Protocol Initiated - if Braden Score 18 or less Description: If consults are not indicated, leave blank or document N/A Outcome: Progressing Goal: Diabetes Guidelines if Diabetic/Glucose > 140 Description: If diabetic or lab glucose is > 140 mg/dl - Initiate Diabetes/Hyperglycemia Guidelines & Document Interventions  Outcome: Progressing   Problem: RH SKIN INTEGRITY Goal: RH STG ABLE TO PERFORM INCISION/WOUND CARE W/ASSISTANCE Description: STG Able To Perform Incision/Wound Care With Supervision Assistance. Outcome: Progressing   Problem: RH SAFETY Goal: RH STG ADHERE TO SAFETY PRECAUTIONS W/ASSISTANCE/DEVICE Description: STG Adhere to Safety Precautions With Cues and Reminders. Outcome: Progressing   Problem: RH PAIN MANAGEMENT Goal: RH STG PAIN MANAGED AT OR BELOW PT'S PAIN GOAL Description: < 3 on a 0-10 pain scale. Outcome: Progressing   Problem: RH KNOWLEDGE DEFICIT GENERAL Goal: RH STG INCREASE KNOWLEDGE OF SELF CARE AFTER HOSPITALIZATION Description: Patient will demonstrate knowledge of medication management, dietary management, fluid restrictions, diabetes management with educational materials and handouts provided by staff independently at discharge. Outcome: Progressing

## 2020-10-19 NOTE — Evaluation (Signed)
Physical Therapy Assessment and Plan  Patient Details  Name: Hannah [name] MRN: [id number] Date of Birth: 16-Aug-1965  PT Diagnosis: Difficulty walking, Edema, Hemiplegia dominant, Hypertonia, Impaired sensation, Muscle weakness and Pain in RLE Rehab Potential: Good ELOS: 10-12 days   Today's Date: 10/19/2020 PT Individual Time: 1045-1200 PT Individual Time Calculation (min): 75 min    Hospital Problem: Principal Problem:   Debility   Past Medical History:  Past Medical History:  Diagnosis Date  . Cerebral infarction ([place]) 07/12/2017   2009 first one, has had a total of 4 stokes  . Diabetes type 2, controlled ([place])   . Hemiparesis affecting right side as late effect of cerebrovascular accident (CVA) ([place])   . Hyperlipidemia   . Hypertension   . Multiple sclerosis ([place]) 2009  . Stroke [facility])    Past Surgical History:  Past Surgical History:  Procedure Laterality Date  . APPENDECTOMY    . AV FISTULA PLACEMENT Right 10/02/2020   Procedure: RIGHT ARTERIOVENOUS (AV) FISTULA CREATION;  Surgeon: Waynetta Sandy, MD;  Location: [place];  Service: Vascular;  Laterality: Right;  . CYSTOSTOMY W/ BLADDER BIOPSY    . IR FLUORO GUIDE CV LINE RIGHT  09/29/2020  . IR US GUIDE VASC ACCESS RIGHT  09/29/2020  . OVARIAN CYST SURGERY    . stent placed in heart      Assessment & Plan Clinical Impression: Patient is a 55 year old female with history of multiple strokes with residual right spastic HP, MS (no treatment), T2DM with progressive nephropathy, multiple recent admissions for acute on chronic renal failure and was admitted on 09/26/20 with fluid overload, malaise and uremia. She was started on IV diuresis with good results but continued to have uremic symptoms. Low iron stores supplemented and she ws started on aranesp for significant anemia due to chronic disease. Dialysis recommended by Dr. Moshe Cipro and after multiple discussions, patient was agreeable to have IJ placed by Dr. Anselm Pancoast  on 05/16. MWF HD initiated and she had RUE AV fistula placed by Dr. Donzetta Matters on 05/19. She reported LB and right thigh/flank pain with spasms and was found to have spontaneous right psoas hematoma on 05/23. Dr. Donne Hazel consulted for input and recommended holding Plavix X 2 weeks. She continued to be limited by pain and weakness. CIR recommended due to recent functional decline.  +constipation Patient transferred to CIR on 10/18/2020 .   Patient currently requires min with mobility secondary to muscle weakness and muscle joint tightness, decreased cardiorespiratoy endurance, abnormal tone and decreased coordination and decreased sitting balance, decreased standing balance, decreased postural control, hemiplegia and decreased balance strategies.  Prior to hospitalization, patient was min with mobility and lived with Daughter (pt reports living with 1 daughter and the other daughter lives next door) in a [place] home.  Home access is 1 step up on curbLevel entry.  Patient will benefit from skilled PT intervention to maximize safe functional mobility, minimize fall risk and decrease caregiver burden for planned discharge home with 24 hour assist.  Anticipate patient will benefit from follow up [facility] at discharge.  PT - End of Session Activity Tolerance: Decreased this session Endurance Deficit: Yes Endurance Deficit Description: Pt with limited standing endurance, asked to defer functional ambulation during OT eval due to fatigue PT Assessment Rehab Potential (ACUTE/IP ONLY): Good PT Barriers to Discharge: Hemodialysis PT Patient demonstrates impairments in the following area(s): Balance;Edema;Endurance;Motor;Pain;Sensory;Skin Integrity PT Transfers Functional Problem(s): Bed Mobility;Bed to Chair;Car;Furniture PT Locomotion Functional Problem(s): Ambulation;Wheelchair Mobility;Stairs PT Plan PT Intensity:  Minimum of 1-2 x/day ,45 to 90 minutes PT Frequency: 5 out of 7 days PT Duration Estimated Length of  Stay: 10-12 days PT Treatment/Interventions: Ambulation/gait training;Balance/vestibular training;Community reintegration;Discharge planning;Disease management/prevention;DME/adaptive equipment instruction;Functional mobility training;Neuromuscular re-education;Pain management;Patient/family education;Psychosocial support;Skin care/wound management;Splinting/orthotics;Stair training;Therapeutic Activities;Therapeutic Exercise;UE/LE Strength taining/ROM;UE/LE Coordination activities;Wheelchair propulsion/positioning;Functional electrical stimulation PT Transfers Anticipated Outcome(s): supervision basic transfers; CGA car PT Locomotion Anticipated Outcome(s): CGA household gait distances; min assist stairs PT Recommendation Follow Up Recommendations: Home health PT;24 hour supervision/assistance Patient destination: Home Equipment Details: pt reports having a manual and power w/c (loaner only awaiting her personal one), RW (reports she needs a new one though due to unstableness)   PT Evaluation Precautions/Restrictions Precautions Precautions: Fall Precaution Comments: HD access port RUQ, spastic Rt hemiplegia at baseline Pain  Reports pain in RLE near hematoma site - rest and repositioned as able. Home Living/Prior Functioning Home Living Available Help at Discharge: Family;Available 24 hours/day (daughters) Type of Home: Apartment Home Access: Level entry Entrance Stairs-Number of Steps: 1 step up on curb Entrance Stairs-Rails: None Home Layout: One level Bathroom Shower/Tub: Tub/shower unit (+TTB) Bathroom Toilet: Handicapped height (uses 3:1 over toilet) Bathroom Accessibility: Yes  Lives With: Daughter (pt reports living with 1 daughter and the other daughter lives next door) Prior Function Level of Independence: Requires assistive device for independence;Needs assistance with tranfers;Needs assistance with ADLs (Pt reports having supervision assistance for ADLs PTA, min guard for  functional transfers using RW)  Able to Take Stairs?: Yes Driving: No Vision/Perception  Vision - Assessment Additional Comments: She had a difficult time locating ADL/grooming items that were in front of her and easy to see, most evidently noted when there was low contrast between object/surface Perception Perception: Within Functional Limits Praxis Praxis: Intact  Cognition Overall Cognitive Status: Within Functional Limits for tasks assessed Arousal/Alertness: Awake/alert Immediate Memory Recall: Sock;Blue;Bed Memory Recall Sock: With Cue Memory Recall Blue: Without Cue Memory Recall Bed: Without Cue Awareness: Appears intact Safety/Judgment: Appears intact Sensation Sensation Light Touch: Impaired Detail Light Touch Impaired Details: Impaired RLE (reports numbness area of hip/thigh area) Proprioception: Appears Intact Coordination Gross Motor Movements are Fluid and Coordinated: No Fine Motor Movements are Fluid and Coordinated: No Coordination and Movement Description: Affected by residual spastic Rt hemplegia Finger Nose Finger Test: Decreased speed + accuracy Rt >Lt Motor  Motor Motor: Hemiplegia;Abnormal tone;Abnormal postural alignment and control Motor - Skilled Clinical Observations: generalized debility, residual R hemiparesis/spasticity, pain limiting   Trunk/Postural Assessment  Cervical Assessment Cervical Assessment: Exceptions to [facility] (forward head) Thoracic Assessment Thoracic Assessment: Exceptions to [facility] (rounded shoulders) Lumbar Assessment Lumbar Assessment: Exceptions to [facility] (posterior pelvic tilt) Postural Control Postural Control: Deficits on evaluation (limited in standing while engaging in functional tasks)  Balance Balance Balance Assessed: Yes Static Sitting Balance Static Sitting - Level of Assistance: 5: Stand by assistance Dynamic Sitting Balance Dynamic Sitting - Balance Support: During functional activity Dynamic Sitting - Level of  Assistance: 5: Stand by assistance (washing feet utilizing figure 4 position EOB) Static Standing Balance Static Standing - Level of Assistance: 4: Min assist Dynamic Standing Balance Dynamic Standing - Balance Support: During functional activity;No upper extremity supported Dynamic Standing - Level of Assistance: 4: Min assist Dynamic Standing - Balance Activities: Lateral lean/weight shifting;Forward lean/weight shifting (Elevating pants over hips) Extremity Assessment  RUE Assessment RUE Assessment: Exceptions to [facility] Active Range of Motion (AROM) Comments: Shoulder ROM limited to ~150 degrees, limited forearm supination General Strength Comments: spasticity at baseline due to CVA hx LUE Assessment LUE  Assessment: Within Functional Limits Active Range of Motion (AROM) Comments: WNL RLE Assessment RLE Assessment: Exceptions to [facility] General Strength Comments: grossly3- to 3+/5; pain also limiting hip ROM/strength RLE Tone RLE Tone: Hypertonic;Modified Ashworth Body Part - Modified Ashworth Scale: Hamstrings;Gastrocnemius;Soleus;Quadriceps Hamstrings - Modified Ashworth Scale for Grading Hypertonia RLE: Slight increase in muscle tone, manifested by a catch, followed by minimal resistance throughout the remainder (less than half) of the ROM QUADRICEPS - Modified Ashworth Scale for Grading Hypertonia RLE: Slight increase in muscle tone, manifested by a catch, followed by minimal resistance throughout the remainder (less than half) of the ROM GASTROCNEMIUS - Modified Ashworth Scale for Grading Hypertonia RLE: Slight increase in muscle tone, manifested by a catch and release or by minimal resistance at the end of the range of motion when the affected part(s) is moved in flexion or extension SOLEUS - Modified Ashworth Scale for Grading Hypertonia RLE: Slight increase in muscle tone, manifested by a catch and release or by minimal resistance at the end of the range of motion when the affected part(s)  is moved in flexion or extension LLE Assessment LLE Assessment: Exceptions to [facility] General Strength Comments: grossly 4/5  Care Tool Care Tool Bed Mobility Roll left and right activity   Roll left and right assist level: Minimal Assistance - Patient > 75%    Sit to lying activity   Sit to lying assist level: Minimal Assistance - Patient > 75%    Lying to sitting edge of bed activity   Lying to sitting edge of bed assist level: Minimal Assistance - Patient > 75%     Care Tool Transfers Sit to stand transfer   Sit to stand assist level: Minimal Assistance - Patient > 75%    Chair/bed transfer   Chair/bed transfer assist level: Minimal Assistance - Patient > 75%     Physiological scientist transfer assist level: Minimal Assistance - Patient > 75%      Care Tool Locomotion Ambulation   Assist level: Minimal Assistance - Patient > 75% Assistive device: Walker-rolling Max distance: 35'  Walk 10 feet activity   Assist level: Minimal Assistance - Patient > 75% Assistive device: Walker-rolling   Walk 50 feet with 2 turns activity Walk 50 feet with 2 turns activity did not occur: Safety/medical concerns (fatigue)      Walk 150 feet activity Walk 150 feet activity did not occur: Safety/medical concerns      Walk 10 feet on uneven surfaces activity Walk 10 feet on uneven surfaces activity did not occur: Safety/medical concerns      Stairs   Assist level: Moderate Assistance - Patient - 50 - 74% Stairs assistive device: 2 hand rails Max number of stairs: 4  Walk up/down 1 step activity   Walk up/down 1 step (curb) assist level: Minimal Assistance - Patient > 75% Walk up/down 1 step or curb assistive device: 2 hand rails    Walk up/down 4 steps activity Walk up/down 4 steps assist level: Moderate Assistance - Patient - 50 - 74% Walk up/down 4 steps assistive device: 2 hand rails  Walk up/down 12 steps activity Walk up/down 12 steps activity did not occur:  Safety/medical concerns      Pick up small objects from floor Pick up small object from the floor (from standing position) activity did not occur: Safety/medical concerns      Wheelchair Will patient use wheelchair at discharge?: Yes Type of Wheelchair:  (  uses power chair at home sometimes on bad days)   Wheelchair assist level: Dependent - Patient 0%    Wheel 50 feet with 2 turns activity   Assist Level: Dependent - Patient 0%  Wheel 150 feet activity   Assist Level: Dependent - Patient 0%    Refer to Care Plan for Long Term Goals  SHORT TERM GOAL WEEK 1 PT Short Term Goal 1 (Week 1): Pt will be able to perform bed mobility with supervision PT Short Term Goal 2 (Week 1): Pt will be able to perform transfers with CGA PT Short Term Goal 3 (Week 1): Pt will be able to gait x 50' with min assist PT Short Term Goal 4 (Week 1): Pt will be able to perform curb step negotiion with RW with mod assist  Recommendations for other services: None   Skilled Therapeutic Intervention Mobility Bed Mobility Bed Mobility: Rolling Left;Supine to Sit;Sit to Supine Rolling Left: Minimal Assistance - Patient > 75% Supine to Sit: Minimal Assistance - Patient > 75% Sit to Supine: Minimal Assistance - Patient > 75% Transfers Transfers: Stand to Sit;Sit to Stand;Stand Pivot Transfers Sit to Stand: Minimal Assistance - Patient > 75% Stand to Sit: Minimal Assistance - Patient > 75% Stand Pivot Transfers: Minimal Assistance - Patient > 75% Stand Pivot Transfer Details: Verbal cues for gait pattern;Verbal cues for safe use of DME/AE;Tactile cues for weight shifting Locomotion  Gait Gait Distance (Feet): 35 Feet Assistive device: Rolling walker Gait Gait Pattern: Impaired (decreased weightbearing through RLE, decreased step length, pain limiting, maintains R PF) Stairs / Additional Locomotion Stairs: Yes Stairs Assistance: Minimal Assistance - Patient > 75%;Moderate Assistance - Patient 50 -  74% Stair Management Technique: Two rails;Step to pattern Number of Stairs: 4 Height of Stairs: 6 Wheelchair Mobility Wheelchair Mobility: No   Evaluation completed (see details above and below) with education on PT POC and goals and individual treatment initiated with focus on  Functional mobility retraining, assessment of various transfers including car and different surfaces with RW (used PTA), gait training with RW, stair negotiation, and overall education. Pt engaged in Dance therapy (group already on going) to address functional dynamic balance (seated and in standing with RW for support and challenged with 1 UE support), and addressing overall cardiovascular endurance and socialization for improved overall affect/mood pt able to tolerate dynamic standing ~ 2.5 min during dancing to a song.   Discharge Criteria: Patient will be discharged from PT if patient refuses treatment 3 consecutive times without medical reason, if treatment goals not met, if there is a change in medical status, if patient makes no progress towards goals or if patient is discharged from hospital.  The above assessment, treatment plan, treatment alternatives and goals were discussed and mutually agreed upon: by patient  Juanna Cao, PT, DPT, CBIS  10/19/2020, 12:33 PM

## 2020-10-20 DIAGNOSIS — I69398 Other sequelae of cerebral infarction: Secondary | ICD-10-CM | POA: Diagnosis not present

## 2020-10-20 DIAGNOSIS — R5381 Other malaise: Secondary | ICD-10-CM | POA: Diagnosis not present

## 2020-10-20 DIAGNOSIS — R42 Dizziness and giddiness: Secondary | ICD-10-CM | POA: Diagnosis not present

## 2020-10-20 DIAGNOSIS — N186 End stage renal disease: Secondary | ICD-10-CM

## 2020-10-20 DIAGNOSIS — R252 Cramp and spasm: Secondary | ICD-10-CM

## 2020-10-20 DIAGNOSIS — Z992 Dependence on renal dialysis: Secondary | ICD-10-CM

## 2020-10-20 LAB — RENAL FUNCTION PANEL
Albumin: 2.7 g/dL — ABNORMAL LOW (ref 3.5–5.0)
Anion gap: 10 (ref 5–15)
BUN: 20 mg/dL (ref 6–20)
CO2: 26 mmol/L (ref 22–32)
Calcium: 8.5 mg/dL — ABNORMAL LOW (ref 8.9–10.3)
Chloride: 95 mmol/L — ABNORMAL LOW (ref 98–111)
Creatinine, Ser: 5.61 mg/dL — ABNORMAL HIGH (ref 0.44–1.00)
GFR, Estimated: 8 mL/min — ABNORMAL LOW (ref >60)
Glucose, Bld: 264 mg/dL — ABNORMAL HIGH (ref 70–99)
Phosphorus: 5.2 mg/dL — ABNORMAL HIGH (ref 2.5–4.6)
Potassium: 4.5 mmol/L (ref 3.5–5.1)
Sodium: 131 mmol/L — ABNORMAL LOW (ref 135–145)

## 2020-10-20 LAB — GLUCOSE, CAPILLARY
Glucose-Capillary: 167 mg/dL — ABNORMAL HIGH (ref 70–99)
Glucose-Capillary: 296 mg/dL — ABNORMAL HIGH (ref 70–99)
Glucose-Capillary: 235 mg/dL — ABNORMAL HIGH (ref 70–99)
Glucose-Capillary: 214 mg/dL — ABNORMAL HIGH (ref 70–99)

## 2020-10-20 MED ORDER — DANTROLENE SODIUM 25 MG PO CAPS
25.0000 mg | ORAL_CAPSULE | Freq: Every day | ORAL | Status: DC
  Administered 2020-10-20 – 2020-10-22 (×3): 25 mg via ORAL
  Filled 2020-10-20 (×3): qty 1

## 2020-10-20 MED ORDER — DARBEPOETIN ALFA 200 MCG/0.4ML IJ SOSY
200.0000 ug | PREFILLED_SYRINGE | INTRAMUSCULAR | Status: DC
  Filled 2020-10-20 (×2): qty 0.4

## 2020-10-20 NOTE — Progress Notes (Signed)
PROGRESS NOTE   Subjective/Complaints:  Pt reports thinks they are taking off too much fluid- she feels lightheaded, queasy and ill after HD- and it takes time to recover- and her BP is also low when she gets back.   Also was basically taken off Zanaflex for spasticity and things are worse.    ROS:  Pt denies SOB, abd pain, CP, N/V/C/D, and vision changes   Objective:   No results found. Recent Labs    10/18/20 1259  WBC 7.8  HGB 9.0*  HCT 29.2*  PLT 357   Recent Labs    10/19/20 0554 10/20/20 0740  NA 133* 131*  K 4.0 4.5  CL 96* 95*  CO2 29 26  GLUCOSE 55* 264*  BUN 13 20  CREATININE 3.68* 5.61*  CALCIUM 9.1 8.5*    Intake/Output Summary (Last 24 hours) at 10/20/2020 1753 Last data filed at 10/20/2020 1323 Gross per 24 hour  Intake 541 ml  Output --  Net 541 ml        Physical Exam: Vital Signs Blood pressure (!) 116/58, pulse 90, temperature 98.3 F (36.8 C), temperature source Oral, resp. rate 17, weight 55.4 kg, SpO2 99 %.   General: awake, alert, appropriate, bathing at bedside with OT and daughter in room; NAD HENT: conjugate gaze; oropharynx moist- R facial weakness CV: regular rate; no JVD Pulmonary: CTA B/L; no W/R/R- good air movement GI: soft, NT, ND, (+)BS Psychiatric: appropriate; interactive Neurological: alert- MAS of at least 2 in RUE- posturing with RUE was well at rest  Skin: intact Neurological:     Mental Status: She is alert and oriented to person, place, and time.     Comments: Right facial weakness with  Mild dysarthria and expressive deficits. Right HP--unchanged per input.  Right upper extremity with 4/5 hand grip- strength otherwise intact. RLE with 4/5 proximally and 3/5 distally. Left sided strength intact   Assessment/Plan: 1. Functional deficits which require 3+ hours per day of interdisciplinary therapy in a comprehensive inpatient rehab setting.  Physiatrist  is providing close team supervision and 24 hour management of active medical problems listed below.  Physiatrist and rehab team continue to assess barriers to discharge/monitor patient progress toward functional and medical goals  Care Tool:  Bathing    Body parts bathed by patient: Right arm,Left arm,Chest,Abdomen,Front perineal area,Buttocks,Right upper leg,Left upper leg,Right lower leg,Left lower leg,Face         Bathing assist Assist Level: Contact Guard/Touching assist     Upper Body Dressing/Undressing Upper body dressing   What is the patient wearing?: Pull over shirt,Bra    Upper body assist Assist Level: Minimal Assistance - Patient > 75%    Lower Body Dressing/Undressing Lower body dressing      What is the patient wearing?: Pants,Underwear/pull up     Lower body assist Assist for lower body dressing: Minimal Assistance - Patient > 75%     Toileting Toileting Toileting Activity did not occur (Clothing management and hygiene only): N/A (no void or bm)  Toileting assist Assist for toileting: Moderate Assistance - Patient 50 - 74%     Transfers Chair/bed transfer  Transfers assist  Chair/bed transfer assist level: Minimal Assistance - Patient > 75%     Locomotion Ambulation   Ambulation assist      Assist level: Minimal Assistance - Patient > 75% Assistive device: Walker-rolling Max distance: 35'   Walk 10 feet activity   Assist     Assist level: Minimal Assistance - Patient > 75% Assistive device: Walker-rolling   Walk 50 feet activity   Assist Walk 50 feet with 2 turns activity did not occur: Safety/medical concerns (fatigue)         Walk 150 feet activity   Assist Walk 150 feet activity did not occur: Safety/medical concerns         Walk 10 feet on uneven surface  activity   Assist Walk 10 feet on uneven surfaces activity did not occur: Safety/medical concerns         Wheelchair     Assist Will patient  use wheelchair at discharge?: Yes Type of Wheelchair:  (uses power chair at home sometimes on bad days)    Wheelchair assist level: Dependent - Patient 0%      Wheelchair 50 feet with 2 turns activity    Assist        Assist Level: Dependent - Patient 0%   Wheelchair 150 feet activity     Assist      Assist Level: Dependent - Patient 0%   Blood pressure (!) 116/58, pulse 90, temperature 98.3 F (36.8 C), temperature source Oral, resp. rate 17, weight 55.4 kg, SpO2 99 %.   Medical Problem List and Plan: 1.  Cardiopulmonary debility             -patient may shower             -ELOS/Goals: 2-3 weeks MinA             -con't PT and OT/CIR 2.  Antithrombotics: -DVT/anticoagulation:  Mechanical: Sequential compression devices, below knee Bilateral lower extremities             -antiplatelet therapy: Plavix on hold until 6/9, discussed with patient.  3. Pain Management: Continue Oxycodone prn.              -on Tizanidine 2 mg tid as per home regimen.  Muscle relaxer's contraindicated with HD--->changed to 2 mg HS per recommendations.              --Continue low dose gabapentin.  4. Mood: LCSW to follow for evaluation and support.              -antipsychotic agents: N/A 5. Neuropsych: This patient is capable of making decisions on her own behalf. 6. Skin/Wound Care: Routine pressure relief measures.  7. Fluids/Electrolytes/Nutrition: Strict I/O.  8. ESRD: Now on HD MWF--schedule at the end of the day             --has been clipped for [facility] [place] 9. HTN: Monitor BP--continue Norvasc, Coreg bid, apresoline tid and  10 Hyponatremia: SSRI d/c by nephrology with slow improved.  11. Nausea: Continue reglan bid-> wean to off? 12. Constipation: Managed with senna S bid and miralax daily.  13. Hypoglycemia with hx of DM: decrease Lantus HS to 9U.  6/6- BGs 167-196 in last 24 hours- will give it 24 hours and titrate Lantus as required 14. Spasticity  6/6- will  start Dantrolene 25 mg QHS for a few days, then 50 mg QHS then 50 mg BID and titrate up as tolerated4 15. Hypotension after HD  6/6- might  do better with midodrine or decreasing BP meds? Pt thinks it's due to taking too much fluid off.    LOS: 2 days A FACE TO FACE EVALUATION WAS PERFORMED  Karalyn Kadel 10/20/2020, 5:53 PM

## 2020-10-20 NOTE — Progress Notes (Signed)
Inpatient [facility] Individual Statement of Services  Patient Name:  Hannah [name]  Date:  10/20/2020  Welcome to the [facility].  Our goal is to provide you with an individualized program based on your diagnosis and situation, designed to meet your specific needs.  With this comprehensive rehabilitation program, you will be expected to participate in at least 3 hours of rehabilitation therapies Monday-Friday, with modified therapy programming on the weekends.  Your rehabilitation program will include the following services:  Physical Therapy (PT), Occupational Therapy (OT), Speech Therapy (ST), 24 hour per day rehabilitation nursing, Therapeutic Recreaction (TR), Neuropsychology, Care Coordinator, Rehabilitation Medicine, Nutrition Services, Pharmacy Services and Other  Weekly team conferences will be held on Tuesdays to discuss your progress.  Your Inpatient Rehabilitation Care Coordinator will talk with you frequently to get your input and to update you on team discussions.  Team conferences with you and your family in attendance may also be held.  Expected length of stay: 9-12 Days  Overall anticipated outcome: Supervision/MinG  Depending on your progress and recovery, your program may change. Your Inpatient Rehabilitation Care Coordinator will coordinate services and will keep you informed of any changes. Your Inpatient Rehabilitation Care Coordinator's name and contact numbers are listed  below.  The following services may also be recommended but are not provided by the [facility]:    [place] will be made to provide these services after discharge if needed.  Arrangements include referral to agencies that provide these services.  Your insurance has been verified to be:  [facility] Medicare Your primary doctor is:  Vernie Shanks, MD  Pertinent information will  be shared with your doctor and your insurance company.  Inpatient Rehabilitation Care Coordinator:  [name], [place] or [phone]  Information discussed with and copy given to patient by: Dyanne Iha, 10/20/2020, 11:28 AM

## 2020-10-20 NOTE — Progress Notes (Signed)
Occupational Therapy Session Note  Patient Details  Name: Hannah [name] MRN: [id number] Date of Birth: 25-Sep-1965  Today's Date: 10/20/2020 OT Individual Time: [id number] OT Individual Time Calculation (min): 60 min    Short Term Goals: Week 1:  OT Short Term Goal 1 (Week 1): Pt will complete LB dressing with CGA at sit<stand level using LRAD as needed OT Short Term Goal 2 (Week 1): Pt will complete an ambulatory toilet transfer with no more than Min A while using LRAD OT Short Term Goal 3 (Week 1): Pt will complete an ambulatory shower transfer with no more than Min A while using LRAD   Skilled Therapeutic Interventions/Progress Updates:    Pt greeted at time of session supine in bed resting agreeable to OT session. Bed mobility CGA with extended time to sit EOB, stand pivot CGA/Min bed > wheelchair. Pt transported to gym and set up on SCIFIT performed for 10 mins total on level 3 with ace wrap on R hand for old CVA deficits for grip. Performing dynamic standing tasks on airex mat both with and without UE support, Min A for standing balance without AD. Pt focus on single leg stance and lateral weight shifting in standing to improve ADL. Transported back to room, alarm on call bell in reach. Also discussed DME throughout as well as DC planning.     Therapy Documentation Precautions:  Precautions Precautions: Fall Precaution Comments: HD access port RUQ, AV graft maturing in Rt UE, spastic Rt hemiplegia at baseline Restrictions Weight Bearing Restrictions: No     Therapy/Group: Individual Therapy  Viona Gilmore 10/20/2020, 3:00 PM

## 2020-10-20 NOTE — Progress Notes (Signed)
Easily aroused, but immediately falls back asleep. "They wore me out in therapy." Held scheduled melatonin, patient agreeable.Patient refused Lantus insulin, but agreed to take SSI. Right hip swollen and firm to touch. Right AVG-+ &+. Hannah [name] A

## 2020-10-20 NOTE — Discharge Instructions (Addendum)
Inpatient Rehab Discharge Instructions  Hannah [name] Discharge date and time:  11/03/20  Activities/Precautions/ Functional Status: Activity: no lifting, driving, or strenuous exercise for till cleared by MD Diet: diabetic diet and renal diet- limit fluids to 1200 cc/day Wound Care: keep wound clean and dry   Functional status:  ___ No restrictions     ___ Walk up steps independently _X__ 24/7 supervision/assistance   ___ Walk up steps with assistance ___ Intermittent supervision/assistance  ___ Bathe/dress independently ___ Walk with walker     _X__ Bathe/dress with assistance ___ Walk Independently    ___ Shower independently ___ Walk with assistance    ___ Shower with assistance _X__ No alcohol     ___ Return to work/school ________   Special Instructions: Monitor blood sugars before meals and at bedtime. Continue current dose of Tresiba 12  units. If blood sugars are running over 120, call Dr. Jacelyn Grip for input.     My questions have been answered and I understand these instructions. I will adhere to these goals and the provided educational materials after my discharge from the hospital.  Patient/Caregiver Signature _______________________________ Date __________  Clinician Signature _______________________________________ Date __________  Please bring this form and your medication list with you to all your follow-up doctor's appointments.   ___________________________________________________________________________________    Corrin Parker, MS, RD, LDN Clinical Dietitian Office number [phone]

## 2020-10-20 NOTE — Progress Notes (Signed)
Physical Therapy Session Note  Patient Details  Name: Hannah [name] MRN: [id number] Date of Birth: Jan 03, 1966  Today's Date: 10/20/2020 PT Individual Time: 1000-1100 PT Individual Time Calculation (min): 60 min   Short Term Goals: Week 1:  PT Short Term Goal 1 (Week 1): Pt will be able to perform bed mobility with supervision PT Short Term Goal 2 (Week 1): Pt will be able to perform transfers with CGA PT Short Term Goal 3 (Week 1): Pt will be able to gait x 50' with min assist PT Short Term Goal 4 (Week 1): Pt will be able to perform curb step negotiion with RW with mod assist  Skilled Therapeutic Interventions/Progress Updates:    pt received in [facility] and agreeable to therapy. Pt requested to go outside if possible, nursing aware and agreeable. Pt taken outside in [facility] total A for time and energy. Pt directed in seated BLE and BUE strengthening exercises with music for improved mood and coordination with reciprocal mobility: shoulder rolls, shrugs, bicep curls, alternating chest press, marching, LAQ, ankle pumps, core activations with forward/backward slight leans. Pt directed in gait training with Rolling walker for 100' min A for stability on uneven concrete surfaces VC for step length. Pt required rest breaks intermittently throughout session but reported greatly improved mood and overall wellbeing after session. Pt returned to room total A for time, and requested to return to bed. Pt min A for Stand pivot transfer and CGA for sit>supine. Pt left in bed, All needs in reach and in good condition. Call light in hand.  And alarm set.   Therapy Documentation Precautions:  Precautions Precautions: Fall Precaution Comments: HD access port RUQ, AV graft maturing in Rt UE, spastic Rt hemiplegia at baseline Restrictions Weight Bearing Restrictions: No General:   Vital Signs:   Pain: Pain Assessment Pain Scale: 0-10 Pain Score: 0-No pain Mobility:   Locomotion :    Trunk/Postural Assessment  :    Balance:   Exercises:   Other Treatments:      Therapy/Group: Individual Therapy  Junie Panning 10/20/2020, 12:18 PM

## 2020-10-20 NOTE — Progress Notes (Signed)
Patient ID: Hannah [name], female   DOB: [birth year], 55 y.o.   MRN: [id number]  [facility] Progress Note    Subjective:    Feels good today.   Objective:   BP (!) 116/58 (BP Location: Left Arm)   Pulse 90   Temp 98.3 F (36.8 C) (Oral)   Resp 17   Wt 55.4 kg   SpO2 99%   BMI 22.34 kg/m   Intake/Output: I/O last 3 completed shifts: In: 796 [P.O.:796] Out: -    Intake/Output this shift:  Total I/O In: 421 [P.O.:421] Out: -  Weight change: 0.7 kg  Physical Exam: Gen: NAD CVS: RRR Resp: CTA Abd: benign Ext: no edema, RUE AVF +T/B  Labs: BMET Recent Labs  Lab 10/14/20 0147 10/15/20 0239 10/16/20 0336 10/17/20 0135 10/18/20 0156 10/19/20 0554 10/20/20 0740  NA 131* 128* 132* 132* 131* 133* 131*  K 3.9 3.7 3.5 3.5 4.0 4.0 4.5  CL 97* 92* 94* 97* 94* 96* 95*  CO2 '28 27 30 28 26 29 26  '$ GLUCOSE 177* 134* 90 112* 135* 55* 264*  BUN '12 19 8 19 '$ 29* 13 20  CREATININE 3.36* 4.81* 3.23* 5.49* 6.61* 3.68* 5.61*  ALBUMIN 2.2* 2.4* 2.7* 2.6* 2.6* 2.9* 2.7*  CALCIUM 8.2* 9.0 8.7* 8.7* 8.6* 9.1 8.5*  PHOS 3.1 3.9 3.7 4.5 5.5* 3.8 5.2*   CBC Recent Labs  Lab 10/15/20 1500 10/18/20 1259  WBC 8.6 7.8  HGB 9.8* 9.0*  HCT 31.7* 29.2*  MCV 94.9 97.0  PLT 405* 357      Medications:    . amLODipine  10 mg Oral Daily  . carvedilol  12.5 mg Oral BID WC  . Chlorhexidine Gluconate Cloth  6 each Topical Q0600  . [START ON 10/21/2020] darbepoetin (ARANESP) injection - DIALYSIS  200 mcg Intravenous Q Tue-HD  . diclofenac Sodium  2 g Topical QID  . famotidine  20 mg Oral Daily  . gabapentin  100 mg Oral QHS  . hydrALAZINE  50 mg Oral Q8H  . insulin aspart  0-5 Units Subcutaneous QHS  . insulin aspart  0-9 Units Subcutaneous TID WC  . insulin glargine  9 Units Subcutaneous QHS  . melatonin  3 mg Oral QHS  . metoCLOPramide  5 mg Oral BID  . polyethylene glycol  17 g Oral Daily  . rosuvastatin  10 mg Oral Daily  . senna-docusate  1 tablet Oral BID  .  tiZANidine  2 mg Oral QHS     Assessment/ Plan:   1. Recent CVA with right sided spastic hemiparesis - now in CIR. 2. ESRD - continue with HD on TTS schedule.  Accepted to [facility] second shift after discharge.  Complained of weakness and dizziness after HD on 10/18/20 so will limit UF with HD tomorrow.  3. Anemia: due to ESRD.  On high dose ESA.  Transfuse prn 4. CKD-MBD: WNL, no meds 5. Nutrition: renal diet, carb modified 6. Hypertension: stable, less UF as above.  7. Vascular access: RUE brachiobasilic AVF created [id number] by Dr. Carola Frost, MD [place] Pager [phone] 10/20/2020, 2:25 PM

## 2020-10-20 NOTE — Progress Notes (Signed)
Occupational Therapy Session Note  Patient Details  Name: Chrissandra Liuzza MRN: [id number] Date of Birth: 05/19/1965  Today's Date: 10/20/2020 OT Individual Time: [id number] OT Individual Time Calculation (min): 59 min    Short Term Goals: Week 1:  OT Short Term Goal 1 (Week 1): Pt will complete LB dressing with CGA at sit<stand level using LRAD as needed OT Short Term Goal 2 (Week 1): Pt will complete an ambulatory toilet transfer with no more than Min A while using LRAD OT Short Term Goal 3 (Week 1): Pt will complete an ambulatory shower transfer with no more than Min A while using LRAD  Skilled Therapeutic Interventions/Progress Updates:    Pt greeted in bed with no c/o pain. Requesting to get dressed during session and daughter arriving within minutes of OT's arrival. Supine<sit from flat bed without bedrail, per setup at home, completed with Max A, raising up on the Rt side as she got up this way at home per daughter. While EOB, pt first engaged in multiple grooming tasks with vcs for short term memory recall and also min cuing for locating items that she wanted to find. Per pt, she is set up to see an eye specialist after she leaves the hospital due to visual impairments. CGA for dynamic standing balance using RW and Min A for LB dressing overall, pt given increased time to use Rt hand during bilateral tasks for tone mgt. Min A for donning bra to be safe with chest port site. Min A for ambulating ~4 ft to w/c parked near bed's baseboard, pt using RW with cues for upright posture. While sitting at the sink, she engaged in oral care with setup assistance, pt using both hands to meet task demands. At end of session pt opted to remain sitting up in the w/c, all needs within reach and chair alarm set. Tx focus placed on ADL retraining, NMR, activity tolerance, and sitting/standing balance.   Therapy Documentation Precautions:  Precautions Precautions: Fall Precaution Comments: HD access port RUQ, AV  graft maturing in Rt UE, spastic Rt hemiplegia at baseline Restrictions Weight Bearing Restrictions: No ADL: ADL Eating: Not assessed Grooming: Setup Where Assessed-Grooming: Edge of bed Upper Body Bathing: Supervision/safety Where Assessed-Upper Body Bathing: Edge of bed Lower Body Bathing: Contact guard Where Assessed-Lower Body Bathing: Edge of bed Upper Body Dressing: Supervision/safety Where Assessed-Upper Body Dressing: Edge of bed Lower Body Dressing: Minimal assistance Where Assessed-Lower Body Dressing: Edge of bed Toileting: Not assessed Toilet Transfer: Not assessed Tub/Shower Transfer: Not assessed      Therapy/Group: Individual Therapy  Saliou Barnier A Marialena Wollen 10/20/2020, 12:16 PM

## 2020-10-20 NOTE — Progress Notes (Signed)
Inpatient Rehabilitation Care Coordinator Assessment and Plan Patient Details  Name: Hannah [name] MRN: [id number] Date of Birth: 19-Jul-1965  Today's Date: 10/20/2020  Hospital Problems: Principal Problem:   Debility  Past Medical History:  Past Medical History:  Diagnosis Date  . Cerebral infarction ([place]) 07/12/2017   2009 first one, has had a total of 4 stokes  . Diabetes type 2, controlled ([place])   . Hemiparesis affecting right side as late effect of cerebrovascular accident (CVA) ([place])   . Hyperlipidemia   . Hypertension   . Multiple sclerosis ([place]) 2009  . Stroke [facility])    Past Surgical History:  Past Surgical History:  Procedure Laterality Date  . APPENDECTOMY    . AV FISTULA PLACEMENT Right 10/02/2020   Procedure: RIGHT ARTERIOVENOUS (AV) FISTULA CREATION;  Surgeon: Waynetta Sandy, MD;  Location: [facility];  Service: Vascular;  Laterality: Right;  . CYSTOSTOMY W/ BLADDER BIOPSY    . IR FLUORO GUIDE CV LINE RIGHT  09/29/2020  . IR US GUIDE VASC ACCESS RIGHT  09/29/2020  . OVARIAN CYST SURGERY    . stent placed in heart     Social History:  reports that she has never smoked. She has never used smokeless tobacco. She reports previous alcohol use. She reports that she does not use drugs.  Family / Support Systems Children: Aldine Contes Anticipated Caregiver: [name] and [name] Ability/Limitations of Caregiver: n/a Caregiver Availability: 24/7  Social History Preferred language: English Religion: None Read: Yes Write: Yes Employment Status: Disabled Public relations account executive Issues: n/a Guardian/ConservatorLocation manager   Abuse/Neglect Abuse/Neglect Assessment Can Be Completed: Yes Physical Abuse: Denies Verbal Abuse: Denies Sexual Abuse: Denies Exploitation of patient/patient's resources: Denies Self-Neglect: Denies  Emotional Status Pt's affect, behavior and adjustment status: pleasant Recent Psychosocial Issues: n/a Psychiatric History: n/a Substance  Abuse History: n/a  Patient / Family Perceptions, Expectations & Goals Pt/Family understanding of illness & functional limitations: yes Premorbid pt/family roles/activities: Pt required assistance previously overall Anticipated changes in roles/activities/participation: Motorized scooter Pt/family expectations/goals: [employer]: None Premorbid Home Care/DME Agencies: Other (Comment) Risk manager, Civil engineer, contracting, Marketing executive, Radio producer, Radio broadcast assistant) Transportation available at discharge: [employer] able to [employer] referrals recommended: Neuropsychology  Discharge Planning Living Arrangements: Children Support Systems: Children Type of Residence: Private residence Insurance underwriter Resources: [employer] (specify county) ([employer]) Museum/gallery curator Resources: [employer] Screen Referred: No Living Expenses: Lives with family Money Management: Patient,Family Does the patient have any problems obtaining your medications?: No Home Management: Assistance from daughters Patient/Family Preliminary Plans: daughters to continue Care Coordinator Barriers to Discharge: Hemodialysis Care Coordinator Anticipated Follow Up Needs: HH/OP Expected length of stay: 9-12 Days  Clinical Impression previous pt. Met with pt re-introduced self, explained role and addressed questions or concerns. sw will cont to follow up.   Dyanne Iha 10/20/2020, 12:39 PM

## 2020-10-20 NOTE — Progress Notes (Signed)
Patient information reviewed and entered into eRehab System by Becky Mako Pelfrey, PPS coordinator. Information including medical coding, function ability, and quality indicators will be reviewed and updated through discharge.   

## 2020-10-21 DIAGNOSIS — N186 End stage renal disease: Secondary | ICD-10-CM | POA: Diagnosis not present

## 2020-10-21 DIAGNOSIS — I69398 Other sequelae of cerebral infarction: Secondary | ICD-10-CM | POA: Diagnosis not present

## 2020-10-21 DIAGNOSIS — R5381 Other malaise: Secondary | ICD-10-CM | POA: Diagnosis not present

## 2020-10-21 DIAGNOSIS — R42 Dizziness and giddiness: Secondary | ICD-10-CM | POA: Diagnosis not present

## 2020-10-21 LAB — CBC
HCT: 31.1 % — ABNORMAL LOW (ref 36.0–46.0)
Hemoglobin: 9.6 g/dL — ABNORMAL LOW (ref 12.0–15.0)
MCH: 29.9 pg (ref 26.0–34.0)
MCHC: 30.9 g/dL (ref 30.0–36.0)
MCV: 96.9 fL (ref 80.0–100.0)
Platelets: 267 10*3/uL (ref 150–400)
RBC: 3.21 MIL/uL — ABNORMAL LOW (ref 3.87–5.11)
RDW: 18.4 % — ABNORMAL HIGH (ref 11.5–15.5)
WBC: 7.2 10*3/uL (ref 4.0–10.5)
nRBC: 0 % (ref 0.0–0.2)

## 2020-10-21 LAB — GLUCOSE, CAPILLARY
Glucose-Capillary: 167 mg/dL — ABNORMAL HIGH (ref 70–99)
Glucose-Capillary: 287 mg/dL — ABNORMAL HIGH (ref 70–99)
Glucose-Capillary: 200 mg/dL — ABNORMAL HIGH (ref 70–99)

## 2020-10-21 LAB — RENAL FUNCTION PANEL
Albumin: 2.9 g/dL — ABNORMAL LOW (ref 3.5–5.0)
Anion gap: 11 (ref 5–15)
BUN: 33 mg/dL — ABNORMAL HIGH (ref 6–20)
CO2: 27 mmol/L (ref 22–32)
Calcium: 9 mg/dL (ref 8.9–10.3)
Chloride: 94 mmol/L — ABNORMAL LOW (ref 98–111)
Creatinine, Ser: 6.63 mg/dL — ABNORMAL HIGH (ref 0.44–1.00)
GFR, Estimated: 7 mL/min — ABNORMAL LOW (ref >60)
Glucose, Bld: 146 mg/dL — ABNORMAL HIGH (ref 70–99)
Phosphorus: 6.2 mg/dL — ABNORMAL HIGH (ref 2.5–4.6)
Potassium: 4.4 mmol/L (ref 3.5–5.1)
Sodium: 132 mmol/L — ABNORMAL LOW (ref 135–145)

## 2020-10-21 MED ORDER — DARBEPOETIN ALFA 200 MCG/0.4ML IJ SOSY
PREFILLED_SYRINGE | INTRAMUSCULAR | Status: AC
  Administered 2020-10-21: 200 ug via INTRAVENOUS
  Filled 2020-10-21: qty 0.4

## 2020-10-21 MED ORDER — HEPARIN SODIUM (PORCINE) 1000 UNIT/ML IJ SOLN
1000.0000 [IU] | INTRAMUSCULAR | Status: DC | PRN
  Filled 2020-10-21 (×3): qty 1

## 2020-10-21 MED ORDER — HEPARIN SODIUM (PORCINE) 1000 UNIT/ML IJ SOLN
INTRAMUSCULAR | Status: AC
  Administered 2020-10-21: 3200 [IU] via INTRAVENOUS
  Filled 2020-10-21: qty 4

## 2020-10-21 MED ORDER — INSULIN GLARGINE 100 UNIT/ML ~~LOC~~ SOLN
10.0000 [IU] | Freq: Every day | SUBCUTANEOUS | Status: DC
  Administered 2020-10-21 – 2020-10-23 (×3): 10 [IU] via SUBCUTANEOUS
  Filled 2020-10-21 (×4): qty 0.1

## 2020-10-21 NOTE — Progress Notes (Signed)
Patient ID: Quaneshia Hoye, female   DOB: 1965-06-30, 55 y.o.   MRN: [id number] S: No complaints. O:BP (!) 146/69 (BP Location: Left Arm)   Pulse 86   Temp 98.1 F (36.7 C) (Oral)   Resp 16   Wt 56.5 kg   SpO2 98%   BMI 22.78 kg/m   Intake/Output Summary (Last 24 hours) at 10/21/2020 1542 Last data filed at 10/21/2020 1300 Gross per 24 hour  Intake 617 ml  Output --  Net 617 ml   Intake/Output: I/O last 3 completed shifts: In: 838 [P.O.:838] Out: -   Intake/Output this shift:  Total I/O In: 320 [P.O.:320] Out: -  Weight change: 0.9 kg Gen: NAD CVS: RRR Resp: CTA Abd: +BS, soft, NT/ND Ext: no edema, RUE AVF +T/B  Recent Labs  Lab 10/15/20 0239 10/16/20 0336 10/17/20 0135 10/18/20 0156 10/19/20 0554 10/20/20 0740 10/21/20 0531  NA 128* 132* 132* 131* 133* 131* 132*  K 3.7 3.5 3.5 4.0 4.0 4.5 4.4  CL 92* 94* 97* 94* 96* 95* 94*  CO2 '27 30 28 26 29 26 27  '$ GLUCOSE 134* 90 112* 135* 55* 264* 146*  BUN '19 8 19 '$ 29* 13 20 33*  CREATININE 4.81* 3.23* 5.49* 6.61* 3.68* 5.61* 6.63*  ALBUMIN 2.4* 2.7* 2.6* 2.6* 2.9* 2.7* 2.9*  CALCIUM 9.0 8.7* 8.7* 8.6* 9.1 8.5* 9.0  PHOS 3.9 3.7 4.5 5.5* 3.8 5.2* 6.2*   Liver Function Tests: Recent Labs  Lab 10/19/20 0554 10/20/20 0740 10/21/20 0531  ALBUMIN 2.9* 2.7* 2.9*   No results for input(s): LIPASE, AMYLASE in the last 168 hours. No results for input(s): AMMONIA in the last 168 hours. CBC: Recent Labs  Lab 10/15/20 1500 10/18/20 1259 10/21/20 1443  WBC 8.6 7.8 7.2  HGB 9.8* 9.0* 9.6*  HCT 31.7* 29.2* 31.1*  MCV 94.9 97.0 96.9  PLT 405* 357 267   Cardiac Enzymes: No results for input(s): CKTOTAL, CKMB, CKMBINDEX, TROPONINI in the last 168 hours. CBG: Recent Labs  Lab 10/20/20 1128 10/20/20 1654 10/20/20 2117 10/21/20 0631 10/21/20 1204  GLUCAP 296* 235* 214* 167* 287*    Iron Studies: No results for input(s): IRON, TIBC, TRANSFERRIN, FERRITIN in the last 72 hours. Studies/Results: No results found. Marland Kitchen  amLODipine  10 mg Oral Daily  . carvedilol  12.5 mg Oral BID WC  . Chlorhexidine Gluconate Cloth  6 each Topical Q0600  . dantrolene  25 mg Oral QHS  . darbepoetin (ARANESP) injection - DIALYSIS  200 mcg Intravenous Q Tue-HD  . diclofenac Sodium  2 g Topical QID  . famotidine  20 mg Oral Daily  . gabapentin  100 mg Oral QHS  . hydrALAZINE  50 mg Oral Q8H  . insulin aspart  0-5 Units Subcutaneous QHS  . insulin aspart  0-9 Units Subcutaneous TID WC  . insulin glargine  10 Units Subcutaneous QHS  . melatonin  3 mg Oral QHS  . metoCLOPramide  5 mg Oral BID  . polyethylene glycol  17 g Oral Daily  . rosuvastatin  10 mg Oral Daily  . senna-docusate  1 tablet Oral BID  . tiZANidine  2 mg Oral QHS    BMET    Component Value Date/Time   NA 132 (L) 10/21/2020 0531   K 4.4 10/21/2020 0531   CL 94 (L) 10/21/2020 0531   CO2 27 10/21/2020 0531   GLUCOSE 146 (H) 10/21/2020 0531   BUN 33 (H) 10/21/2020 0531   CREATININE 6.63 (H) 10/21/2020 0531   CALCIUM  9.0 10/21/2020 0531   GFRNONAA 7 (L) 10/21/2020 0531   CBC    Component Value Date/Time   WBC 7.2 10/21/2020 1443   RBC 3.21 (L) 10/21/2020 1443   HGB 9.6 (L) 10/21/2020 1443   HCT 31.1 (L) 10/21/2020 1443   PLT 267 10/21/2020 1443   MCV 96.9 10/21/2020 1443   MCH 29.9 10/21/2020 1443   MCHC 30.9 10/21/2020 1443   RDW 18.4 (H) 10/21/2020 1443   LYMPHSABS 1.3 09/26/2020 2127   MONOABS 0.7 09/26/2020 2127   EOSABS 0.3 09/26/2020 2127   BASOSABS 0.0 09/26/2020 2127     Assessment/Plan:  1. Recent CVA with right sided spastic hemiparesis - now in CIR. 2. ESRD - continue with HD on TTS schedule.  Accepted to [facility] second shift after discharge.  Complained of weakness and dizziness after HD on 10/18/20 so will limit UF with HD tomorrow.  3. Anemia: due to ESRD.  On high dose ESA.  Transfuse prn 4. CKD-MBD: WNL, no meds 5. Nutrition: renal diet, carb modified 6. Hypertension: stable, less UF as above.  7. Vascular access: First  stage, RUE brachiobasilic AVF created [id number] by Dr. Carola Frost, MD [facility] [phone]

## 2020-10-21 NOTE — Progress Notes (Signed)
PROGRESS NOTE   Subjective/Complaints:  Pt scared about them taking too much fluid off in HD today- she felt SO bad after last HD- queasy, nauseated, lightheaded, and just so dry- kept drinking and finally felt better after drank.    ROS:  Pt denies SOB, abd pain, CP, N/V/C/D, and vision changes     Objective:   No results found. Recent Labs    10/18/20 1259  WBC 7.8  HGB 9.0*  HCT 29.2*  PLT 357   Recent Labs    10/20/20 0740 10/21/20 0531  NA 131* 132*  K 4.5 4.4  CL 95* 94*  CO2 26 27  GLUCOSE 264* 146*  BUN 20 33*  CREATININE 5.61* 6.63*  CALCIUM 8.5* 9.0    Intake/Output Summary (Last 24 hours) at 10/21/2020 1024 Last data filed at 10/21/2020 0700 Gross per 24 hour  Intake 482 ml  Output --  Net 482 ml        Physical Exam: Vital Signs Blood pressure (!) 149/71, pulse 89, temperature 98.4 F (36.9 C), temperature source Oral, resp. rate 16, weight 56.3 kg, SpO2 97 %.    General: awake, alert, appropriate, sitting up in w/c at sink, NAD HENT: conjugate gaze; oropharynx moist; R facial droop/weakness CV: regular rate; no JVD Pulmonary: CTA B/L; no W/R/R- good air movement GI: soft, NT, ND, (+)BS Psychiatric: appropriate; quiet; MAS of 2 in RUE Neurological: alert; mild expressive aphasia Skin: intact except R chest HD catheter Neurological:     Right upper extremity with 4/5 hand grip- strength otherwise intact. RLE with 4/5 proximally and 3/5 distally. Left sided strength intact   Assessment/Plan: 1. Functional deficits which require 3+ hours per day of interdisciplinary therapy in a comprehensive inpatient rehab setting.  Physiatrist is providing close team supervision and 24 hour management of active medical problems listed below.  Physiatrist and rehab team continue to assess barriers to discharge/monitor patient progress toward functional and medical goals  Care Tool:  Bathing     Body parts bathed by patient: Right arm,Left arm,Chest,Abdomen,Face (declined LB today)         Bathing assist Assist Level: Supervision/Verbal cueing     Upper Body Dressing/Undressing Upper body dressing   What is the patient wearing?: Pull over shirt,Bra    Upper body assist Assist Level: Minimal Assistance - Patient > 75%    Lower Body Dressing/Undressing Lower body dressing      What is the patient wearing?: Pants     Lower body assist Assist for lower body dressing: Contact Guard/Touching assist     Toileting Toileting Toileting Activity did not occur (Clothing management and hygiene only): N/A (no void or bm)  Toileting assist Assist for toileting: Moderate Assistance - Patient 50 - 74%     Transfers Chair/bed transfer  Transfers assist     Chair/bed transfer assist level: Minimal Assistance - Patient > 75%     Locomotion Ambulation   Ambulation assist      Assist level: Minimal Assistance - Patient > 75% Assistive device: Walker-rolling Max distance: 35'   Walk 10 feet activity   Assist     Assist level: Minimal Assistance - Patient >  75% Assistive device: Walker-rolling   Walk 50 feet activity   Assist Walk 50 feet with 2 turns activity did not occur: Safety/medical concerns (fatigue)         Walk 150 feet activity   Assist Walk 150 feet activity did not occur: Safety/medical concerns         Walk 10 feet on uneven surface  activity   Assist Walk 10 feet on uneven surfaces activity did not occur: Safety/medical concerns         Wheelchair     Assist Will patient use wheelchair at discharge?: Yes Type of Wheelchair:  (uses power chair at home sometimes on bad days)    Wheelchair assist level: Dependent - Patient 0%      Wheelchair 50 feet with 2 turns activity    Assist        Assist Level: Dependent - Patient 0%   Wheelchair 150 feet activity     Assist      Assist Level: Dependent -  Patient 0%   Blood pressure (!) 149/71, pulse 89, temperature 98.4 F (36.9 C), temperature source Oral, resp. rate 16, weight 56.3 kg, SpO2 97 %.   Medical Problem List and Plan: 1.  Cardiopulmonary debility             -patient may shower             -ELOS/Goals: 2-3 weeks MinA             -con't PT and OT 2.  Antithrombotics: -DVT/anticoagulation:  Mechanical: Sequential compression devices, below knee Bilateral lower extremities             -antiplatelet therapy: Plavix on hold until 6/9, discussed with patient.  3. Pain Management: Continue Oxycodone prn.              -on Tizanidine 2 mg tid as per home regimen.  Muscle relaxer's contraindicated with HD--->changed to 2 mg HS per recommendations.              --Continue low dose gabapentin.  4. Mood: LCSW to follow for evaluation and support.              -antipsychotic agents: N/A 5. Neuropsych: This patient is capable of making decisions on her own behalf. 6. Skin/Wound Care: Routine pressure relief measures.  7. Fluids/Electrolytes/Nutrition: Strict I/O.  8. ESRD: Now on HD MWF--schedule at the end of the day             --has been clipped for [facility]  6/7- pt is upset- feeling like they've taken too much fluid off- I'm not clear if they need to add midodrine, etc? 9. HTN: Monitor BP--continue Norvasc, Coreg bid, apresoline tid and  10 Hyponatremia: SSRI d/c by nephrology with slow improved.  11. Nausea: Continue reglan bid-> wean to off? 12. Constipation: Managed with senna S bid and miralax daily.   6/7- LBM 2 days ago- normally goes 1-2x/week, so normal for her- if no BM by tomorrow, will intervene 13. Hypoglycemia with hx of DM: decrease Lantus HS to 9U.  6/6- BGs 167-196 in last 24 hours- will give it 24 hours and titrate Lantus as required  6/7- will increase Lantus to 10 units nightly- and monitor 14. Spasticity  6/6- will start Dantrolene 25 mg QHS for a few days, then 50 mg QHS then 50 mg BID and  titrate up as tolerated  6/7- started last night- no side effects so far! Con't Dantrolene  15. Hypotension after HD  6/6- might do better with midodrine or decreasing BP meds? Pt thinks it's due to taking too much fluid off.   6/7- will let HD team know with report today   LOS: 3 days A FACE TO FACE EVALUATION WAS PERFORMED  Tabias Swayze 10/21/2020, 10:24 AM

## 2020-10-21 NOTE — Progress Notes (Signed)
Patient ID: Hannah [name], female   DOB: 1966-04-30, 55 y.o.   MRN: [id number] Team Conference Report to Patient/Family  Team Conference discussion was reviewed with the patient and caregiver, including goals, any changes in plan of care and target discharge date.  Patient and caregiver express understanding and are in agreement.  The patient has a target discharge date of 10/31/20.  Sw met with pt provided conference updates. Patient in agreeable with date due to her daughter moving into their new place on 6/15. Patient concerned about transportation for HD, SW set pt up with filled out  application for [facility] [place].  [place], [place]  Dyanne Iha 10/21/2020, 1:19 PM

## 2020-10-21 NOTE — Progress Notes (Signed)
Occupational Therapy Session Note  Patient Details  Name: Hannah [name] MRN: [id number] Date of Birth: Nov 30, 1965  Today's Date: 10/21/2020 OT Individual Time: [id number] OT Individual Time Calculation (min): 55 min    Short Term Goals: Week 1:  OT Short Term Goal 1 (Week 1): Pt will complete LB dressing with CGA at sit<stand level using LRAD as needed OT Short Term Goal 2 (Week 1): Pt will complete an ambulatory toilet transfer with no more than Min A while using LRAD OT Short Term Goal 3 (Week 1): Pt will complete an ambulatory shower transfer with no more than Min A while using LRAD   Skilled Therapeutic Interventions/Progress Updates:    Pt greeted at time of session semireclined in bed resting agreeable to OT session, no pain. Supine > sit Superviison and stand pivot to wheelchair supervision/CGA with RW with extended time and slow movements. Pt does have underlying MS per pt. Set up to sink and performed UB bathe/dress with Min A only for getting bra on otherwise supervision/set up. Needed Min a to doff shirt d/t tight fit. Donned pants with CGA remembering hemitechniques. Oral hygiene set up, donned shoes almost with set up but needing help with heel on R shoe. Set up with alarm on call bell in reach.    Therapy Documentation Precautions:  Precautions Precautions: Fall Precaution Comments: HD access port RUQ, AV graft maturing in Rt UE, spastic Rt hemiplegia at baseline Restrictions Weight Bearing Restrictions: No    Therapy/Group: Individual Therapy  Viona Gilmore 10/21/2020, 7:17 AM

## 2020-10-21 NOTE — Progress Notes (Signed)
Physical Therapy Session Note  Patient Details  Name: Hannah [name] MRN: [id number] Date of Birth: [birth year]  Today's Date: 10/21/2020 PT Individual Time: [id number] PT Individual Time Calculation (min): 45 min   Short Term Goals: Week 1:  PT Short Term Goal 1 (Week 1): Pt will be able to perform bed mobility with supervision PT Short Term Goal 2 (Week 1): Pt will be able to perform transfers with CGA PT Short Term Goal 3 (Week 1): Pt will be able to gait x 50' with min assist PT Short Term Goal 4 (Week 1): Pt will be able to perform curb step negotiion with RW with mod assist Week 2:     Skilled Therapeutic Interventions/Progress Updates:    Pain:  Pt reports no pain.  Treatment to tolerance.  Rest breaks and repositioning as needed.  Pt initially oob in wc and requesting to use bathroom.  Sit to stand from wc w/cga, additional time, cues to achieve full upright.  Gait 6f to commode and commode transfer w/RW and cga, additional time, cues for posture, moves RLE w/mild synergistic pattern.   Pt able to manage clothing w/cga only.  IVery slow process to void.  ndependent w/hygiene, Sit to stand w/cga and supervision for clothing management.  Gait 581fto sink including turning, stood w/min assist w/1X post balance loss/min for recovery while washing hands. Turn/ist to wc w/cga Pt transported to gym.   Gait 13543f/RW, cga, cues for upright posture.  Synergistic advancement RLE, decreased bilat step length, decreased coordination bilat LEs.   Sidestepping length of bars x 4 w/emphasis on controlled placement, posture.  Static standing balance without UE support - min assist, able to maintain w/cga up to 30 sec w/significant sway, delayed balance reactions throughout, tends to grab bars when perceiving LOB.   At end of session, pt transported to room.  Pt left oob in wc w/alarm belt set and needs in reach  Therapy Documentation Precautions:  Precautions Precautions: Fall Precaution  Comments: HD access port RUQ, AV graft maturing in Rt UE, spastic Rt hemiplegia at baseline Restrictions Weight Bearing Restrictions: No    Therapy/Group: Individual Therapy  BarCallie FieldingT [place]7/2022, 12:47 PM

## 2020-10-21 NOTE — IPOC Note (Signed)
Overall Plan of Care [facility]) Patient Details Name: Hannah [name] MRN: [id number] DOB: 03-06-1966  Admitting Diagnosis: Debility  Hospital Problems: Principal Problem:   Debility     Functional Problem List: Nursing Behavior,Bladder,Bowel,Endurance,Edema,Medication Management,Pain,Safety,Skin Integrity  PT Balance,Edema,Endurance,Motor,Pain,Sensory,Skin Integrity  OT Balance,Vision,Edema,Endurance,Motor,Safety (mild Rt thigh edema from psoas hematoma)  SLP    TR         Basic ADL's: OT Bathing,Dressing,Toileting     Advanced  ADL's: OT Light Housekeeping     Transfers: PT Bed Mobility,Bed to [employer]  OT Toilet,Tub/Shower     Locomotion: PT Ambulation,Wheelchair Mobility,Stairs     Additional Impairments: OT Fuctional Use of Upper Extremity (Rt UE, mild)  SLP        TR      Anticipated Outcomes Item Anticipated Outcome  Self Feeding No goal  Swallowing      Basic self-care  Supervision  Toileting  Supervision   Bathroom Transfers CGA  Bowel/Bladder  Supervision  Transfers  supervision basic transfers; CGA car  Locomotion  CGA household gait distances; min assist stairs  Communication     Cognition     Pain  < 3  Safety/Judgment  Supervision and no falls   Therapy Plan: PT Intensity: Minimum of 1-2 x/day ,45 to 90 minutes PT Frequency: 5 out of 7 days PT Duration Estimated Length of Stay: 10-12 days OT Intensity: Minimum of 1-2 x/day, 45 to 90 minutes OT Frequency: 5 out of 7 days OT Duration/Estimated Length of Stay: 12-14 days     Due to the current state of emergency, patients may not be receiving their 3-hours of Medicare-mandated therapy.   Team Interventions: Nursing Interventions Patient/Family Education,Bladder Management,Bowel Management,Disease Management/Prevention,Pain Management,Medication Management,Skin Care/Wound Management,Discharge Planning,Psychosocial Support  PT interventions Ambulation/gait  training,Balance/vestibular training,Community reintegration,Discharge planning,Disease management/prevention,DME/adaptive equipment instruction,Functional mobility training,Neuromuscular re-education,Pain management,Patient/family education,Psychosocial support,Skin care/wound management,Splinting/orthotics,Stair training,Therapeutic Activities,Therapeutic Exercise,UE/LE Strength taining/ROM,UE/LE Advertising account executive stimulation  OT Interventions Balance/vestibular training,Disease Special educational needs teacher re-education,Self Care/advanced ADL retraining,Therapeutic Exercise,Wheelchair propulsion/positioning,UE/LE Strength taining/ROM,Pain Education administrator instruction,Cognitive remediation/compensation,Community reintegration,Functional electrical stimulation,Patient/family education,Splinting/orthotics,UE/LE Coordination activities,Visual/perceptual remediation/compensation,Therapeutic Activities,Psychosocial support,Functional mobility training,Discharge planning  SLP Interventions    TR Interventions    SW/CM Interventions Discharge Planning,Psychosocial Support,Patient/Family Education,Disease Management/Prevention   Barriers to Discharge MD  Medical stability, Home enviroment access/loayout, Lack of/limited family support and Hemodialysis  Nursing Decreased caregiver support,Home environment access/layout,Wound Care,Lack of/limited family support,Hemodialysis,Medication compliance,Behavior    PT Hemodialysis    OT Hemodialysis    SLP      SW Hemodialysis     Team Discharge Planning: Destination: PT-Home ,OT- Home , SLP-  Projected Follow-up: PT-Home health PT,24 hour supervision/assistance, OT-  Home health OT, SLP-  Projected Equipment Needs: PT- , OT- To be determined, SLP-  Equipment Details: PT-pt reports having a manual and power w/c (loaner only awaiting her personal one), RW (reports she needs a  new one though due to unstableness), OT-  Patient/family involved in discharge planning: PT- Patient,  OT-Patient, SLP-   MD ELOS: 12-14 days Medical Rehab Prognosis:  Good Assessment: Pt is a 55 yr old female with new onset HD- via R chest catheter; here for cardiopulmonary debility; and constipation, DM- with labile BGs; spasticity from previous CVA with R hemiparesis, and hypotension after HD- thinking took too much fluid off-   Goals Supervision to CGA by d/c.   See Team Conference Notes for weekly updates to the plan of care

## 2020-10-21 NOTE — Progress Notes (Signed)
Physical Therapy Session Note  Patient Details  Name: Hannah [name] MRN: [id number] Date of Birth: 1965/05/23  Today's Date: 10/21/2020 PT Individual Time: 1000-1100 PT Individual Time Calculation (min): 60 min   Short Term Goals: Week 1:  PT Short Term Goal 1 (Week 1): Pt will be able to perform bed mobility with supervision PT Short Term Goal 2 (Week 1): Pt will be able to perform transfers with CGA PT Short Term Goal 3 (Week 1): Pt will be able to gait x 50' with min assist PT Short Term Goal 4 (Week 1): Pt will be able to perform curb step negotiion with RW with mod assist Week 2:     Skilled Therapeutic Interventions/Progress Updates:    pt received in [facility] and agreeable to therapy. Pt taken to gym total A for time and energy. Pt denied pain throughout. Pt directed in static standing and x2 Sit to stand without AD min A with pt demonstrated decreased ability to maintain R knee extension without walker. Pt directed in multiple Sit to stand throughout session all at [facility] for stability. Pt directed in forward step ups on 6" step alternating min A improving to CGA 2x10 with Rolling walker for stability. Pt directed in lateral step up 3" step 2x10 each for improved hip strengthening and activation at R hip with tactile cues for decreased Rt hip drop in standing with L step up. Pt taken out side for rest of session, total A for time. Pt directed in gait training with Rolling walker 20' outside and multiple rounds of 10-20' in gym with Rolling walker min A all reps 2/2 increased stability on RLE worsening with fatigue. Once outside one rep of 20 ' completed with Rolling walker min A and pt fatigued quickly. Pt educated on breathing techniques for recovery and improved tolerance to activity with good effect. Pt taken back to unit and requested to return to bed. Pt directed in Stand pivot transfer to bed with Rolling walker at min A and CGA sit>supine. Pt left in bed, All needs in reach and in good  condition. Call light in hand.  And alarm set.   Therapy Documentation Precautions:  Precautions Precautions: Fall Precaution Comments: HD access port RUQ, AV graft maturing in Rt UE, spastic Rt hemiplegia at baseline Restrictions Weight Bearing Restrictions: No General:   Vital Signs: Therapy Vitals Temp: 98.4 F (36.9 C) Temp Source: Oral Pulse Rate: 89 Resp: 16 BP: (!) 149/71 Patient Position (if appropriate): Lying Oxygen Therapy SpO2: 97 % O2 Device: Room Air Pain: Pain Assessment Pain Scale: 0-10 Pain Score: 0-No pain Mobility:   Locomotion :    Trunk/Postural Assessment :    Balance:   Exercises:   Other Treatments:      Therapy/Group: Individual Therapy  Junie Panning 10/21/2020, 7:47 AM

## 2020-10-21 NOTE — Patient Care Conference (Signed)
Inpatient RehabilitationTeam Conference and Plan of Care Update Date: 10/21/2020   Time: 11:43 AM    Patient Name: Hannah [name]      Medical Record Number: [id number]  Date of Birth: December 09, 1965 Sex: Female         Room/Bed: 4W05C/4W05C-01 Payor Info: Payor: [employer] MEDICARE / Plan: [facility] MEDICARE / Product Type: *No Product type* /    Admit Date/Time:  10/18/2020  5:50 PM  Primary Diagnosis:  [facility] Problems: Principal Problem:   Debility    Expected Discharge Date: Expected Discharge Date: 10/31/20  Team Members Present: Physician leading conference: Dr. Courtney Heys Care Coodinator Present: [name], BSW;Terressa Evola Creig Hines, RN, BSN, [place] Nurse Present: Dorthula Nettles, RN PT Present: Excell Seltzer, PT OT Present: Other (comment) Paulette Blanch, OT) [place] Coordinator present : Ileana Ladd, PT     Current Status/Progress Goal Weekly Team Focus  Bowel/Bladder   Patient is continent of bowel and bladder  Patient will remain continent of bowel and bladder  Will assess qshift and PRN   Swallow/Nutrition/ Hydration             ADL's   Min A for dressing, CGA bathing, ADL transfers CGA, old R hemi  Supervision  energy conservation, ADL retraining, standing balance/tolerance, functional mobility, ADL transfers   Mobility   min A bed mob, transfers, gait 25' RW, decreased endurance grossly, instability R>L  CGA grossly, 83' RW for gait, 1 curb step CGA  balance, transfers, gait, WC mobility, increase tolerance to activity   Communication             Safety/Cognition/ Behavioral Observations            Pain   Patient reports no pain at this time  Patient will remain pain free  Will assess qshift and PRN   Skin   Patient has a fistula incision in rt arm  Patient's fistula incision will heal  Will assess qshift and PRN     Discharge Planning:  discharging back home with dtrs to provide care   Team Discussion: New HD patient, Hx of stroke, low  dose Dantrolene started. Hypoglycemia, BG's in 200's, brittle DM. Reports that she feels like Dialysis takes off too much fluid. Continent B/B, reports muscle spasms, graft to the right arm, HD catheter. Teaching insulin administration. Has concerns about getting to Dialysis once discharged. Patient on target to meet rehab goals: yes, min assist overall, limited with right side, poor endurance. Min assist dressing, supervision goals.  *See Care Plan and progress notes for long and short-term goals.   Revisions to Treatment Plan:  Increased Lantus, started low dose Dantrolene.  Teaching Needs: Family education, medication management, pain management, skin/wound care, diabetes management/education, Dialysis education, transfer training, gait training, balance training, endurance training, safety awareness.   Current Barriers to Discharge: Decreased caregiver support, Medical stability, Home enviroment access/layout, Lack of/limited family support, Hemodialysis, Medication compliance and Behavior  Possible Resolutions to Barriers: Continue current medications, provide emotional support.     Medical Summary Current Status: DM- labile/brittle- BGs in 200s--; spasticity R side; new HD- feels bad afterwards;  Barriers to Discharge: Hemodialysis;Medical stability;Home enviroment access/layout;Decreased family/caregiver support  Barriers to Discharge Comments: going home wiht daughters- needs transport to new HD; Possible Resolutions to [employer] Focus: increased Lantus; started Dantrolene for spasticity; focus on spasticity and labs/renal issues as well as BG control-  d/c 6/17   Continued Need for Acute Rehabilitation Level of Care: The patient requires daily medical management by  a physician with specialized training in physical medicine and rehabilitation for the following reasons: Direction of a multidisciplinary physical rehabilitation program to maximize functional independence :  Yes Medical management of patient stability for increased activity during participation in an intensive rehabilitation regime.: Yes Analysis of laboratory values and/or radiology reports with any subsequent need for medication adjustment and/or medical intervention. : Yes   I attest that I was present, lead the team conference, and concur with the assessment and plan of the team.   Cristi Loron 10/21/2020, 6:20 PM

## 2020-10-22 DIAGNOSIS — I69398 Other sequelae of cerebral infarction: Secondary | ICD-10-CM | POA: Diagnosis not present

## 2020-10-22 DIAGNOSIS — R5381 Other malaise: Secondary | ICD-10-CM | POA: Diagnosis not present

## 2020-10-22 DIAGNOSIS — R42 Dizziness and giddiness: Secondary | ICD-10-CM | POA: Diagnosis not present

## 2020-10-22 DIAGNOSIS — N186 End stage renal disease: Secondary | ICD-10-CM | POA: Diagnosis not present

## 2020-10-22 LAB — RENAL FUNCTION PANEL
Albumin: 2.8 g/dL — ABNORMAL LOW (ref 3.5–5.0)
Anion gap: 8 (ref 5–15)
BUN: 10 mg/dL (ref 6–20)
CO2: 29 mmol/L (ref 22–32)
Calcium: 8.6 mg/dL — ABNORMAL LOW (ref 8.9–10.3)
Chloride: 96 mmol/L — ABNORMAL LOW (ref 98–111)
Creatinine, Ser: 3.43 mg/dL — ABNORMAL HIGH (ref 0.44–1.00)
GFR, Estimated: 15 mL/min — ABNORMAL LOW (ref >60)
Glucose, Bld: 109 mg/dL — ABNORMAL HIGH (ref 70–99)
Phosphorus: 3.8 mg/dL (ref 2.5–4.6)
Potassium: 3.9 mmol/L (ref 3.5–5.1)
Sodium: 133 mmol/L — ABNORMAL LOW (ref 135–145)

## 2020-10-22 LAB — GLUCOSE, CAPILLARY
Glucose-Capillary: 185 mg/dL — ABNORMAL HIGH (ref 70–99)
Glucose-Capillary: 107 mg/dL — ABNORMAL HIGH (ref 70–99)
Glucose-Capillary: 234 mg/dL — ABNORMAL HIGH (ref 70–99)
Glucose-Capillary: 281 mg/dL — ABNORMAL HIGH (ref 70–99)
Glucose-Capillary: 186 mg/dL — ABNORMAL HIGH (ref 70–99)

## 2020-10-22 MED ORDER — METOCLOPRAMIDE HCL 5 MG PO TABS: 5.0000 mg | ORAL_TABLET | Freq: Two times a day (BID) | ORAL | Status: DC | PRN

## 2020-10-22 MED ORDER — CLOPIDOGREL BISULFATE 75 MG PO TABS
75.0000 mg | ORAL_TABLET | Freq: Every day | ORAL | Status: DC
  Administered 2020-10-23 – 2020-11-03 (×12): 75 mg via ORAL
  Filled 2020-10-22 (×12): qty 1

## 2020-10-22 NOTE — Progress Notes (Signed)
Physical Therapy Session Note  Patient Details  Name: Hannah [name] MRN: [id number] Date of Birth: 1965/12/20  Today's Date: 10/22/2020 PT Individual Time: [id number] PT Individual Time Calculation (min): 53 min   Short Term Goals: Week 1:  PT Short Term Goal 1 (Week 1): Pt will be able to perform bed mobility with supervision PT Short Term Goal 2 (Week 1): Pt will be able to perform transfers with CGA PT Short Term Goal 3 (Week 1): Pt will be able to gait x 50' with min assist PT Short Term Goal 4 (Week 1): Pt will be able to perform curb step negotiion with RW with mod assist  Skilled Therapeutic Interventions/Progress Updates:    Pt received sitting in w/c and eager to participate in therapy session.  Transported to/from gym in w/c for time management and energy conservation. Sit<>stands using RW with CGA for safety during session - pt demos proper UE placement using AD without cuing. Gait training ~50f using RW with CGA for steadying - continues to demo increased tone/spasticity in R LE resulting in decreased foot clearance during swing along with decreased knee extension and heel strike during terminal swing and on initial contact - also demos veering L with AD requiring cuing to correct.   Pt reports she hasn't been participating in community events since prior hospitalization and rehab stay. Therapist educated pt on local stroke support group and provided pt with flyer, pt appreciative.  Standing in // bars with B UE support performed R LE NMR task via forward/backwards stepping over yoga block to promote knee flexion earlier during swing then tapping heel on 6" step to promote knee extension and heel strike to simulate terminal swing phase of gait 2x10 reps with supervision for safety. Static standing balance in // bars without UE support and pt unable to sustain static stance for 30seconds without having posterior LOB requiring min assist to maintain upright - trained pt on use of ankle  and hip strategy to regain minor LOBs - progressed to static standing with slow R/L horizontal head turns with pt continuing to have posterior LOB requiring reinforcement of balance recovery strategy. Dynamic gait training in // bars including: forward walking without UE support requiring min assist for balance due to increased R/L postural sway, backwards walking with BUE support and CGA, and side stepping without UE support requiring min assist for balance and cuing for increased R knee flexion when stepping backwards. Pt requires frequent seated rest breaks during session due to fatigue. Transported back to room in w/c and left sitting with needs in reach, NT present to assess BS, and meal tray setup.   Therapy Documentation Precautions:  Precautions Precautions: Fall Precaution Comments: HD access port RUQ, AV graft maturing in Rt UE, spastic Rt hemiplegia at baseline Restrictions Weight Bearing Restrictions: No  Pain:   No reports of pain throughout session.   Therapy/Group: Individual Therapy  CTawana Scale, PT, DPT, CSRS  10/22/2020, 12:27 PM

## 2020-10-22 NOTE — Progress Notes (Signed)
Occupational Therapy Session Note  Patient Details  Name: Hannah [name] MRN: [id number] Date of Birth: 11/20/1965  Today's Date: 10/22/2020 OT Individual Time: [id number] OT Individual Time Calculation (min): 54 min    Short Term Goals: Week 1:  OT Short Term Goal 1 (Week 1): Pt will complete LB dressing with CGA at sit<stand level using LRAD as needed OT Short Term Goal 2 (Week 1): Pt will complete an ambulatory toilet transfer with no more than Min A while using LRAD OT Short Term Goal 3 (Week 1): Pt will complete an ambulatory shower transfer with no more than Min A while using LRAD   Skilled Therapeutic Interventions/Progress Updates:    Pt greeted at time of session sitting up in wheelchair no pain, agreeable to OT session and wanting to go outside. Checked with nursing, still needed to give patient's meds, performed short distance functional mobility in hall w/ RW for approx 60-70 feet with CGA and wheelchair follow. Presented back to room during rest break for RN to perform med pass. Pt then transported outside for uplifting mood and quality of life as she enjoys being outside. Performed the following for BUE there ex: 1x12-15 reps of bicep curls, chest press, overhead press, FWD circles, backward circles with brief rest break. Also pt having questions regarding MS diagnosis, provided resources and explained different types and pt receptive. Pt transported back inside, stand pivot w/ RW to bed CGA. Alarm on call bell in reach.  Therapy Documentation Precautions:  Precautions Precautions: Fall Precaution Comments: HD access port RUQ, AV graft maturing in Rt UE, spastic Rt hemiplegia at baseline Restrictions Weight Bearing Restrictions: No     Therapy/Group: Individual Therapy  Viona Gilmore 10/22/2020, 7:16 AM

## 2020-10-22 NOTE — Progress Notes (Signed)
PROGRESS NOTE   Subjective/Complaints:  Pt reports that she feels so good today- after only getting 1L of fluid taken off in HD- she has no queasiness, lightheadedness or nausea.  Did having a lot of itching all over with no rash last night- it finally calmed down and was able to sleep.  Melatonin helpful for sleep. Did get benadryl for itching.    ROS:  Pt denies SOB, abd pain, CP, N/V/C/D, and vision changes     Objective:   No results found. Recent Labs    10/21/20 1443  WBC 7.2  HGB 9.6*  HCT 31.1*  PLT 267   Recent Labs    10/21/20 0531 10/22/20 0552  NA 132* 133*  K 4.4 3.9  CL 94* 96*  CO2 27 29  GLUCOSE 146* 109*  BUN 33* 10  CREATININE 6.63* 3.43*  CALCIUM 9.0 8.6*    Intake/Output Summary (Last 24 hours) at 10/22/2020 0930 Last data filed at 10/22/2020 0817 Gross per 24 hour  Intake 680 ml  Output 1000 ml  Net -320 ml        Physical Exam: Vital Signs Blood pressure (!) 149/72, pulse 90, temperature 99 F (37.2 C), temperature source Oral, resp. rate 17, weight 56.2 kg, SpO2 96 %.     General: awake, alert, appropriate, sitting up in bed; smiling; bright; NAD HENT: conjugate gaze; R facial weakness chronic- oropharynx moist CV: regular rate; no JVD Pulmonary: CTA B/L; no W/R/R- good air movement GI: soft, NT, ND, (+)BS Psychiatric: appropriate; bright Neurological: alert; has R facial weakness as well as spasticity and posturing of RUE- MAS of 2  Skin: intact except R chest HD catheter Neurological:     Right upper extremity with 4/5 hand grip- strength otherwise intact. RLE with 4/5 proximally and 3/5 distally. Left sided strength intact   Assessment/Plan: 1. Functional deficits which require 3+ hours per day of interdisciplinary therapy in a comprehensive inpatient rehab setting.  Physiatrist is providing close team supervision and 24 hour management of active medical problems  listed below.  Physiatrist and rehab team continue to assess barriers to discharge/monitor patient progress toward functional and medical goals  Care Tool:  Bathing    Body parts bathed by patient: Right arm,Left arm,Chest,Abdomen,Face (declined LB today)         Bathing assist Assist Level: Supervision/Verbal cueing     Upper Body Dressing/Undressing Upper body dressing   What is the patient wearing?: Pull over shirt    Upper body assist Assist Level: Set up assist    Lower Body Dressing/Undressing Lower body dressing      What is the patient wearing?: Underwear/pull up     Lower body assist Assist for lower body dressing: Minimal Assistance - Patient > 75%     Toileting Toileting Toileting Activity did not occur (Clothing management and hygiene only): N/A (no void or bm)  Toileting assist Assist for toileting: Moderate Assistance - Patient 50 - 74%     Transfers Chair/bed transfer  Transfers assist     Chair/bed transfer assist level: Minimal Assistance - Patient > 75%     Locomotion Ambulation   Ambulation assist  Assist level: Minimal Assistance - Patient > 75% Assistive device: Walker-rolling Max distance: 35'   Walk 10 feet activity   Assist     Assist level: Minimal Assistance - Patient > 75% Assistive device: Walker-rolling   Walk 50 feet activity   Assist Walk 50 feet with 2 turns activity did not occur: Safety/medical concerns (fatigue)         Walk 150 feet activity   Assist Walk 150 feet activity did not occur: Safety/medical concerns         Walk 10 feet on uneven surface  activity   Assist Walk 10 feet on uneven surfaces activity did not occur: Safety/medical concerns         Wheelchair     Assist Will patient use wheelchair at discharge?: Yes Type of Wheelchair:  (uses power chair at home sometimes on bad days)    Wheelchair assist level: Dependent - Patient 0%      Wheelchair 50 feet with  2 turns activity    Assist        Assist Level: Dependent - Patient 0%   Wheelchair 150 feet activity     Assist      Assist Level: Dependent - Patient 0%   Blood pressure (!) 149/72, pulse 90, temperature 99 F (37.2 C), temperature source Oral, resp. rate 17, weight 56.2 kg, SpO2 96 %.   Medical Problem List and Plan: 1.  Cardiopulmonary debility             -patient may shower             -ELOS/Goals: 2-3 weeks MinA             -con't PT and OT- d/c date 6/17 2.  Antithrombotics: -DVT/anticoagulation:  Mechanical: Sequential compression devices, below knee Bilateral lower extremities             -antiplatelet therapy: Plavix on hold until 6/9, discussed with patient.  6/8- will restart Plavix of of tomorrow  3. Pain Management: Continue Oxycodone prn.              -on Tizanidine 2 mg tid as per home regimen.  Muscle relaxer's contraindicated with HD--->changed to 2 mg HS per recommendations.   6/8- pain itself is controlled- spasticity as below             --Continue low dose gabapentin.  4. Mood: LCSW to follow for evaluation and support.              -antipsychotic agents: N/A 5. Neuropsych: This patient is capable of making decisions on her own behalf. 6. Skin/Wound Care: Routine pressure relief measures.  7. Fluids/Electrolytes/Nutrition: Strict I/O.  8. ESRD: Now on HD MWF--schedule at the end of the day             --has been clipped for [facility]  6/7- pt is upset- feeling like they've taken too much fluid off- I'm not clear if they need to add midodrine, etc?  6/8- pt felt much better after HD this time- got 1L taken off- con't regimen per renal 9. HTN: Monitor BP--continue Norvasc, Coreg bid, apresoline tid   6/8- BP slightly elevated today- will monitor for trend before changing meds  10 Hyponatremia: SSRI d/c by nephrology with slow improved.  11. Nausea: Continue reglan bid-> wean to off?  6/8- will make reglan prn- BID prn- so can ask  for if needs it.  12. Constipation: Managed with senna S bid and miralax  daily.   6/7- LBM 2 days ago- normally goes 1-2x/week, so normal for her- if no BM by tomorrow, will intervene  6/8- LBM last night- con't regimen 13. Hypoglycemia with hx of DM: decrease Lantus HS to 9U.  6/6- BGs 167-196 in last 24 hours- will give it 24 hours and titrate Lantus as required  6/7- will increase Lantus to 10 units nightly- and monitor  6/8- BGs much better since increased Lantus last night- 107-185- vs in 200s the whole time- con't to monitor 14. Spasticity  6/6- will start Dantrolene 25 mg  6/8- if still doing well tommorow will increase to 50 mg QHS 15. Hypotension after HD  6/6- might do better with midodrine or decreasing BP meds? Pt thinks it's due to taking too much fluid off.   6/7- will let HD team know with report today  6/8- Sx's resolved with taking off less fluid.    LOS: 4 days A FACE TO FACE EVALUATION WAS PERFORMED  Duilio Heritage 10/22/2020, 9:30 AM

## 2020-10-22 NOTE — Progress Notes (Signed)
Physical Therapy Session Note  Patient Details  Name: Hannah [name] MRN: [id number] Date of Birth: Dec 04, 1965  Today's Date: 10/22/2020 PT Individual Time: 0805-0900 PT Individual Time Calculation (min): 55 min   Short Term Goals: Week 1:  PT Short Term Goal 1 (Week 1): Pt will be able to perform bed mobility with supervision PT Short Term Goal 2 (Week 1): Pt will be able to perform transfers with CGA PT Short Term Goal 3 (Week 1): Pt will be able to gait x 50' with min assist PT Short Term Goal 4 (Week 1): Pt will be able to perform curb step negotiion with RW with mod assist   Skilled Therapeutic Interventions/Progress Updates:   Pt received supine in bed and agreeable to PT. Supine>sit transfer with increased time and supervision assist with mild use of bed rails and cues for reciprocal scooting to come to EOB. Ambulatory transfer to and from toilet for urination with RW and CGA. Pt able to perform clothing management prior to toileting and peri care with supervision assist. Min assist to don pants upon completion.    Pt performed upper body dressing sitting in WC with min assist for safety to manage clothing over Central line.   Pt transported to rehab gym in [facility]. Gait training with RW in gym x 132f with supervision assist from PT for safety. Dynamic gait training with RW to step over 4, 1 inch obstacles on the floor to simulate threshold with CGA-min assist from PT for safety as well as cues for AD management to reduce fall risk. Weave through 6 cones with side stepping between each set; BUE support on RW. Min cues for posture and keeping BLE within RW for side stepping.   [facility] and all needs met.         Therapy Documentation Precautions:  Precautions Precautions: Fall Precaution Comments: HD access port RUQ, AV graft maturing in Rt UE, spastic Rt hemiplegia at baseline Restrictions Weight Bearing Restrictions:  No   Pain:   denies   Therapy/Group: Individual Therapy  ALorie Phenix6/12/2020, 9:00 AM

## 2020-10-22 NOTE — Progress Notes (Signed)
Patient ID: Hannah [name], female   DOB: Oct 02, 1965, 55 y.o.   MRN: [id number] S: No complaints.  HD went much better yesterday. O:BP (!) 149/72   Pulse 90   Temp 99 F (37.2 C) (Oral)   Resp 17   Wt 56.2 kg   SpO2 96%   BMI 22.66 kg/m   Intake/Output Summary (Last 24 hours) at 10/22/2020 1337 Last data filed at 10/22/2020 0817 Gross per 24 hour  Intake 480 ml  Output 1000 ml  Net -520 ml   Intake/Output: I/O last 3 completed shifts: In: 680 [P.O.:680] Out: 1000 [Other:1000]  Intake/Output this shift:  Total I/O In: 240 [P.O.:240] Out: -  Weight change: 0.2 kg Gen: NAD CVS: RRR Resp: CTA Abd: benign Ext: no edema, RUE AVF +T/B  Recent Labs  Lab 10/16/20 0336 10/17/20 0135 10/18/20 0156 10/19/20 0554 10/20/20 0740 10/21/20 0531 10/22/20 0552  NA 132* 132* 131* 133* 131* 132* 133*  K 3.5 3.5 4.0 4.0 4.5 4.4 3.9  CL 94* 97* 94* 96* 95* 94* 96*  CO2 '30 28 26 29 26 27 29  '$ GLUCOSE 90 112* 135* 55* 264* 146* 109*  BUN 8 19 29* 13 20 33* 10  CREATININE 3.23* 5.49* 6.61* 3.68* 5.61* 6.63* 3.43*  ALBUMIN 2.7* 2.6* 2.6* 2.9* 2.7* 2.9* 2.8*  CALCIUM 8.7* 8.7* 8.6* 9.1 8.5* 9.0 8.6*  PHOS 3.7 4.5 5.5* 3.8 5.2* 6.2* 3.8   Liver Function Tests: Recent Labs  Lab 10/20/20 0740 10/21/20 0531 10/22/20 0552  ALBUMIN 2.7* 2.9* 2.8*   No results for input(s): LIPASE, AMYLASE in the last 168 hours. No results for input(s): AMMONIA in the last 168 hours. CBC: Recent Labs  Lab 10/15/20 1500 10/18/20 1259 10/21/20 1443  WBC 8.6 7.8 7.2  HGB 9.8* 9.0* 9.6*  HCT 31.7* 29.2* 31.1*  MCV 94.9 97.0 96.9  PLT 405* 357 267   Cardiac Enzymes: No results for input(s): CKTOTAL, CKMB, CKMBINDEX, TROPONINI in the last 168 hours. CBG: Recent Labs  Lab 10/21/20 1204 10/21/20 2054 10/22/20 0209 10/22/20 0600 10/22/20 1137  GLUCAP 287* 200* 185* 107* 234*    Iron Studies: No results for input(s): IRON, TIBC, TRANSFERRIN, FERRITIN in the last 72 hours. Studies/Results: No  results found. Marland Kitchen amLODipine  10 mg Oral Daily  . carvedilol  12.5 mg Oral BID WC  . Chlorhexidine Gluconate Cloth  6 each Topical Q0600  . [START ON 10/23/2020] clopidogrel  75 mg Oral Daily  . dantrolene  25 mg Oral QHS  . darbepoetin (ARANESP) injection - DIALYSIS  200 mcg Intravenous Q Tue-HD  . diclofenac Sodium  2 g Topical QID  . famotidine  20 mg Oral Daily  . gabapentin  100 mg Oral QHS  . hydrALAZINE  50 mg Oral Q8H  . insulin aspart  0-5 Units Subcutaneous QHS  . insulin aspart  0-9 Units Subcutaneous TID WC  . insulin glargine  10 Units Subcutaneous QHS  . melatonin  3 mg Oral QHS  . polyethylene glycol  17 g Oral Daily  . rosuvastatin  10 mg Oral Daily  . senna-docusate  1 tablet Oral BID  . tiZANidine  2 mg Oral QHS    BMET    Component Value Date/Time   NA 133 (L) 10/22/2020 0552   K 3.9 10/22/2020 0552   CL 96 (L) 10/22/2020 0552   CO2 29 10/22/2020 0552   GLUCOSE 109 (H) 10/22/2020 0552   BUN 10 10/22/2020 0552   CREATININE 3.43 (H) 10/22/2020 [id number]  CALCIUM 8.6 (L) 10/22/2020 0552   GFRNONAA 15 (L) 10/22/2020 0552   CBC    Component Value Date/Time   WBC 7.2 10/21/2020 1443   RBC 3.21 (L) 10/21/2020 1443   HGB 9.6 (L) 10/21/2020 1443   HCT 31.1 (L) 10/21/2020 1443   PLT 267 10/21/2020 1443   MCV 96.9 10/21/2020 1443   MCH 29.9 10/21/2020 1443   MCHC 30.9 10/21/2020 1443   RDW 18.4 (H) 10/21/2020 1443   LYMPHSABS 1.3 09/26/2020 2127   MONOABS 0.7 09/26/2020 2127   EOSABS 0.3 09/26/2020 2127   BASOSABS 0.0 09/26/2020 2127    Assessment/Plan:  1. Recent CVA with right sided spastic hemiparesis - now in CIR. 2. ESRD- continue with HD on TTS schedule. Accepted to [facility] second shift after discharge. Complained of weakness and dizziness after HD on 10/18/20 so UF of 1 liter with HD 10/21/20 and she felt much better.  Goal EDW is now 55.5 kg. 3. Anemia:due to ESRD. On high dose ESA. Transfuse prn 4. CKD-MBD:WNL, no meds 5. Nutrition:renal diet,  carb modified 6. Hypertension:stable, less UF as above.  7. Vascular access: First stage, RUE brachiobasilic AVF created [id number] by Dr. Carola Frost, MD [facility] [phone]

## 2020-10-22 NOTE — Progress Notes (Signed)
Physical Therapy Session Note  Patient Details  Name: Hannah [name] MRN: [id number] Date of Birth: 1965/07/08  Today's Date: 10/22/2020 PT Individual Time: [id number] PT Individual Time Calculation (min): 30 min   Short Term Goals: Week 1:  PT Short Term Goal 1 (Week 1): Pt will be able to perform bed mobility with supervision PT Short Term Goal 2 (Week 1): Pt will be able to perform transfers with CGA PT Short Term Goal 3 (Week 1): Pt will be able to gait x 50' with min assist PT Short Term Goal 4 (Week 1): Pt will be able to perform curb step negotiion with RW with mod assist  Skilled Therapeutic Interventions/Progress Updates:    Pt received seated EOB, agreeable to PT session. No complaints of pain. Pt is min A to don socks and shoes while seated EOB. Sit to stand with CGA to RW throughout session. Ambulation 2 x 100 ft with RW and CGA for balance, cues for upright posture and increased RLE clearance. Sidesteps L/R x 10 ft each direction with mod HHA, increased assist needed due to patient fear of falling. Pt left seated in w/c in room with needs in reach, chair alarm in place at end of session.  Therapy Documentation Precautions:  Precautions Precautions: Fall Precaution Comments: HD access port RUQ, AV graft maturing in Rt UE, spastic Rt hemiplegia at baseline Restrictions Weight Bearing Restrictions: No   Therapy/Group: Individual Therapy   Excell Seltzer, PT, DPT, CSRS  10/22/2020, 5:10 PM

## 2020-10-23 DIAGNOSIS — R42 Dizziness and giddiness: Secondary | ICD-10-CM | POA: Diagnosis not present

## 2020-10-23 DIAGNOSIS — R5381 Other malaise: Secondary | ICD-10-CM | POA: Diagnosis not present

## 2020-10-23 DIAGNOSIS — N186 End stage renal disease: Secondary | ICD-10-CM | POA: Diagnosis not present

## 2020-10-23 DIAGNOSIS — I69398 Other sequelae of cerebral infarction: Secondary | ICD-10-CM | POA: Diagnosis not present

## 2020-10-23 LAB — CBC
HCT: 34.6 % — ABNORMAL LOW (ref 36.0–46.0)
Hemoglobin: 10.4 g/dL — ABNORMAL LOW (ref 12.0–15.0)
MCH: 29.5 pg (ref 26.0–34.0)
MCHC: 30.1 g/dL (ref 30.0–36.0)
MCV: 98.3 fL (ref 80.0–100.0)
Platelets: 226 10*3/uL (ref 150–400)
RBC: 3.52 MIL/uL — ABNORMAL LOW (ref 3.87–5.11)
RDW: 18.1 % — ABNORMAL HIGH (ref 11.5–15.5)
WBC: 6.7 10*3/uL (ref 4.0–10.5)
nRBC: 0.3 % — ABNORMAL HIGH (ref 0.0–0.2)

## 2020-10-23 LAB — RENAL FUNCTION PANEL
Albumin: 3.1 g/dL — ABNORMAL LOW (ref 3.5–5.0)
Anion gap: 8 (ref 5–15)
BUN: 12 mg/dL (ref 6–20)
CO2: 27 mmol/L (ref 22–32)
Calcium: 8.7 mg/dL — ABNORMAL LOW (ref 8.9–10.3)
Chloride: 100 mmol/L (ref 98–111)
Creatinine, Ser: 3.93 mg/dL — ABNORMAL HIGH (ref 0.44–1.00)
GFR, Estimated: 13 mL/min — ABNORMAL LOW (ref >60)
Glucose, Bld: 194 mg/dL — ABNORMAL HIGH (ref 70–99)
Phosphorus: 3.2 mg/dL (ref 2.5–4.6)
Potassium: 3.5 mmol/L (ref 3.5–5.1)
Sodium: 135 mmol/L (ref 135–145)

## 2020-10-23 LAB — GLUCOSE, CAPILLARY
Glucose-Capillary: 156 mg/dL — ABNORMAL HIGH (ref 70–99)
Glucose-Capillary: 228 mg/dL — ABNORMAL HIGH (ref 70–99)
Glucose-Capillary: 129 mg/dL — ABNORMAL HIGH (ref 70–99)
Glucose-Capillary: 289 mg/dL — ABNORMAL HIGH (ref 70–99)

## 2020-10-23 MED ORDER — HEPARIN SODIUM (PORCINE) 1000 UNIT/ML IJ SOLN
INTRAMUSCULAR | Status: AC
  Administered 2020-10-23: 1000 [IU] via INTRAVENOUS
  Filled 2020-10-23: qty 4

## 2020-10-23 MED ORDER — DANTROLENE SODIUM 25 MG PO CAPS
50.0000 mg | ORAL_CAPSULE | Freq: Every day | ORAL | Status: DC
  Administered 2020-10-23 – 2020-10-27 (×5): 50 mg via ORAL
  Filled 2020-10-23 (×5): qty 2

## 2020-10-23 MED ORDER — HEPARIN SODIUM (PORCINE) 1000 UNIT/ML DIALYSIS: 20.0000 [IU]/kg | INTRAMUSCULAR | Status: DC | PRN

## 2020-10-23 NOTE — Progress Notes (Addendum)
[facility] Progress Note   Subjective: Seen in room, up in chair. Very pleasant. Says she believes they were previously pulling too much volume off in HD but this has now been adjusted and she feels much better. HD later today.    Addendum: Was looking at [facility], just found out her God Child died and is crying hysterically. Upset because she was not told. Emotional support to patient.    Objective Vitals:   10/22/20 0459 10/22/20 1430 10/22/20 1938 10/23/20 0442  BP: (!) 149/72 (!) 112/56 (!) 128/56 136/67  Pulse: 90 100 84 87  Resp: '17 17 17 17  '$ Temp: 99 F (37.2 C) 99.3 F (37.4 C) 98.1 F (36.7 C) 98.1 F (36.7 C)  TempSrc: Oral Oral    SpO2: 96% 100% 97% 95%  Weight: 56.2 kg   57 kg   Physical Exam General: Pleasant, well appearing female in NAD Heart: S1,S2. Radiation for AVF heard across precordium. No M/R/G Lungs: CTAB A/P Abdomen Active BS NT Extremities: No LE edema Dialysis Access: L AVF + T/B. Maturing well. RIJ TDC Drsg CDI.    Additional Objective Labs: Basic Metabolic Panel: Recent Labs  Lab 10/20/20 0740 10/21/20 0531 10/22/20 0552  NA 131* 132* 133*  K 4.5 4.4 3.9  CL 95* 94* 96*  CO2 '26 27 29  '$ GLUCOSE 264* 146* 109*  BUN 20 33* 10  CREATININE 5.61* 6.63* 3.43*  CALCIUM 8.5* 9.0 8.6*  PHOS 5.2* 6.2* 3.8   Liver Function Tests: Recent Labs  Lab 10/20/20 0740 10/21/20 0531 10/22/20 0552  ALBUMIN 2.7* 2.9* 2.8*   No results for input(s): LIPASE, AMYLASE in the last 168 hours. CBC: Recent Labs  Lab 10/18/20 1259 10/21/20 1443  WBC 7.8 7.2  HGB 9.0* 9.6*  HCT 29.2* 31.1*  MCV 97.0 96.9  PLT 357 267   Blood Culture No results found for: SDES, SPECREQUEST, CULT, REPTSTATUS  Cardiac Enzymes: No results for input(s): CKTOTAL, CKMB, CKMBINDEX, TROPONINI in the last 168 hours. CBG: Recent Labs  Lab 10/22/20 0600 10/22/20 1137 10/22/20 1740 10/22/20 2116 10/23/20 0555  GLUCAP 107* 234* 281* 186* 156*   Iron  Studies: No results for input(s): IRON, TIBC, TRANSFERRIN, FERRITIN in the last 72 hours. '@lablastinr3'$ @ Studies/Results: No results found. Medications:   amLODipine  10 mg Oral Daily   carvedilol  12.5 mg Oral BID WC   Chlorhexidine Gluconate Cloth  6 each Topical Q0600   clopidogrel  75 mg Oral Daily   dantrolene  50 mg Oral QHS   darbepoetin (ARANESP) injection - DIALYSIS  200 mcg Intravenous Q Tue-HD   diclofenac Sodium  2 g Topical QID   famotidine  20 mg Oral Daily   gabapentin  100 mg Oral QHS   hydrALAZINE  50 mg Oral Q8H   insulin aspart  0-5 Units Subcutaneous QHS   insulin aspart  0-9 Units Subcutaneous TID WC   insulin glargine  10 Units Subcutaneous QHS   melatonin  3 mg Oral QHS   polyethylene glycol  17 g Oral Daily   rosuvastatin  10 mg Oral Daily   senna-docusate  1 tablet Oral BID   tiZANidine  2 mg Oral QHS     Assessment/Plan:   Recent CVA with right sided spastic hemiparesis - now in CIR. ESRD - continue with HD on TTS schedule.  Accepted to [facility] second shift after discharge.  Complained of weakness and dizziness after HD on 10/18/20 so UF of 1 liter with HD 10/21/20  and she felt much better.  Goal EDW is now 55.5 kg. Anemia: due to ESRD.  On high dose ESA. HGB 9.6. Transfuse prn CKD-MBD: WNL, no meds Nutrition: renal diet, carb modified Hypertension: stable, less UF as above. Vascular access: First stage, RUE brachiobasilic AVF created [id number] by Dr. Isaiah Serge H. Brown NP-C 10/23/2020, 11:29 AM  [facility] [phone]   I have seen and examined this patient and agree with plan and assessment in the above note with renal recommendations/intervention highlighted. COntinue with HD qTTS. Governor Rooks Simar Pothier,MD 10/23/2020 12:54 PM

## 2020-10-23 NOTE — Progress Notes (Signed)
PROGRESS NOTE   Subjective/Complaints:   Doing great- did hurt last night after therapy- since overdid it.  Feels good and peeing some.    ROS:  Pt denies SOB, abd pain, CP, N/V/C/D, and vision changes    Objective:   No results found. Recent Labs    10/21/20 1443  WBC 7.2  HGB 9.6*  HCT 31.1*  PLT 267   Recent Labs    10/21/20 0531 10/22/20 0552  NA 132* 133*  K 4.4 3.9  CL 94* 96*  CO2 27 29  GLUCOSE 146* 109*  BUN 33* 10  CREATININE 6.63* 3.43*  CALCIUM 9.0 8.6*    Intake/Output Summary (Last 24 hours) at 10/23/2020 0919 Last data filed at 10/23/2020 0826 Gross per 24 hour  Intake 558 ml  Output --  Net 558 ml        Physical Exam: Vital Signs Blood pressure 136/67, pulse 87, temperature 98.1 F (36.7 C), resp. rate 17, weight 57 kg, SpO2 95 %.      General: awake, alert, appropriate, daughter in room; sitting in w/c; NAD HENT: conjugate gaze; oropharynx moist; R facial weakness CV: regular rate; no JVD Pulmonary: CTA B/L; no W/R/R- good air movement GI: soft, NT, ND, (+)BS Psychiatric: appropriate Neurological: Ox3; MAS of 2 in RUE Skin: intact except R chest HD catheter Neurological:     Right upper extremity with 4/5 hand grip- strength otherwise intact. RLE with 4/5 proximally and 3/5 distally. Left sided strength intact   Assessment/Plan: 1. Functional deficits which require 3+ hours per day of interdisciplinary therapy in a comprehensive inpatient rehab setting. Physiatrist is providing close team supervision and 24 hour management of active medical problems listed below. Physiatrist and rehab team continue to assess barriers to discharge/monitor patient progress toward functional and medical goals  Care Tool:  Bathing    Body parts bathed by patient: Right arm, Left arm, Chest, Abdomen, Face (declined LB today)         Bathing assist Assist Level: Supervision/Verbal  cueing     Upper Body Dressing/Undressing Upper body dressing   What is the patient wearing?: Pull over shirt    Upper body assist Assist Level: Set up assist    Lower Body Dressing/Undressing Lower body dressing      What is the patient wearing?: Underwear/pull up     Lower body assist Assist for lower body dressing: Minimal Assistance - Patient > 75%     Toileting Toileting Toileting Activity did not occur (Clothing management and hygiene only): N/A (no void or bm)  Toileting assist Assist for toileting: Moderate Assistance - Patient 50 - 74%     Transfers Chair/bed transfer  Transfers assist     Chair/bed transfer assist level: Contact Guard/Touching assist Chair/bed transfer assistive device: Programmer, multimedia   Ambulation assist      Assist level: Contact Guard/Touching assist Assistive device: Walker-rolling Max distance: 62f   Walk 10 feet activity   Assist     Assist level: Contact Guard/Touching assist Assistive device: Walker-rolling   Walk 50 feet activity   Assist Walk 50 feet with 2 turns activity did not occur: Safety/medical concerns (fatigue)  Assist level: Contact Guard/Touching assist Assistive device: Walker-rolling    Walk 150 feet activity   Assist Walk 150 feet activity did not occur: Safety/medical concerns         Walk 10 feet on uneven surface  activity   Assist Walk 10 feet on uneven surfaces activity did not occur: Safety/medical concerns         Wheelchair     Assist Will patient use wheelchair at discharge?: Yes Type of Wheelchair:  (uses power chair at home sometimes on bad days)    Wheelchair assist level: Dependent - Patient 0%      Wheelchair 50 feet with 2 turns activity    Assist        Assist Level: Dependent - Patient 0%   Wheelchair 150 feet activity     Assist      Assist Level: Dependent - Patient 0%   Blood pressure 136/67, pulse 87, temperature  98.1 F (36.7 C), resp. rate 17, weight 57 kg, SpO2 95 %.   Medical Problem List and Plan: 1.  Cardiopulmonary debility             -patient may shower             -ELOS/Goals: 2-3 weeks MinA             -con't PT and OT- d/c date 6/17  Con't PT and OT 2.  Antithrombotics: -DVT/anticoagulation:  Mechanical: Sequential compression devices, below knee Bilateral lower extremities             -antiplatelet therapy: Plavix on hold until 6/9, discussed with patient.  6/8- will restart Plavix of of tomorrow   6/9- restarted Plavix- pt also asked about it.  3. Pain Management: Continue Oxycodone prn.              -on Tizanidine 2 mg tid as per home regimen.  Muscle relaxer's contraindicated with HD--->changed to 2 mg HS per recommendations.   6/8- pain itself is controlled- spasticity as below             --Continue low dose gabapentin.  4. Mood: LCSW to follow for evaluation and support.              -antipsychotic agents: N/A 5. Neuropsych: This patient is capable of making decisions on her own behalf. 6. Skin/Wound Care: Routine pressure relief measures.  7. Fluids/Electrolytes/Nutrition: Strict I/O.  8. ESRD: Now on HD MWF--schedule at the end of the day             --has been clipped for [facility]  6/7- pt is upset- feeling like they've taken too much fluid off- I'm not clear if they need to add midodrine, etc?  6/8- pt felt much better after HD this time- got 1L taken off- con't regimen per renal 9. HTN: Monitor BP--continue Norvasc, Coreg bid, apresoline tid   6/8- BP slightly elevated today- will monitor for trend before changing meds  10 Hyponatremia: SSRI d/c by nephrology with slow improved.  11. Nausea: Continue reglan bid-> wean to off?  6/8- will make reglan prn- BID prn- so can ask for if needs it.  12. Constipation: Managed with senna S bid and miralax daily.   6/7- LBM 2 days ago- normally goes 1-2x/week, so normal for her- if no BM by tomorrow, will  intervene  6/8- LBM last night- con't regimen 13. Hypoglycemia with hx of DM: decrease Lantus HS to 9U.  6/6- BGs 167-196 in last  24 hours- will give it 24 hours and titrate Lantus as required  6/7- will increase Lantus to 10 units nightly- and monitor  6/8- BGs much better since increased Lantus last night- 107-185- vs in 200s the whole time- con't to monitor  6/9- Bgs 150s-mid 200s - might need Novolog with meals?- will d/w pt.  14. Spasticity  6/6- will start Dantrolene 25 mg  6/8- if still doing well tommorow will increase to 50 mg QHS  6/9- increased dantrolene to 50 mg QHS- will se ehow tolerates.  15. Hypotension after HD  6/6- might do better with midodrine or decreasing BP meds? Pt thinks it's due to taking too much fluid off.   6/7- will let HD team know with report today  6/8- Sx's resolved with taking off less fluid.    LOS: 5 days A FACE TO FACE EVALUATION WAS PERFORMED  Hannah [name] 10/23/2020, 9:19 AM

## 2020-10-23 NOTE — Progress Notes (Signed)
Occupational Therapy Session Note  Patient Details  Name: Hannah [name] MRN: [id number] Date of Birth: 12-10-1965  Today's Date: 10/23/2020 OT Individual Time: [id number] OT Individual Time Calculation (min): 28 min    Short Term Goals: Week 1:  OT Short Term Goal 1 (Week 1): Pt will complete LB dressing with CGA at sit<stand level using LRAD as needed OT Short Term Goal 2 (Week 1): Pt will complete an ambulatory toilet transfer with no more than Min A while using LRAD OT Short Term Goal 3 (Week 1): Pt will complete an ambulatory shower transfer with no more than Min A while using LRAD    Skilled Therapeutic Interventions/Progress Updates:    Pt greeted at time of session sitting up in wheelchair just finished with RT. No pain. Pt declining ADL and toileting, wanting to go outside. Pt attempting self propel from tight space and unable, transported to hall and able to self propel at very slow speed and for short distance approx 10 feet and therapist resuming. Transported outside for quality of life and pt request, enjoying time outside. Pt again attempting self propelling outside on concrete, needing Min A for short distance and again very slow pace. Discussed outing next week with RT, pt excited for this. Back in room alarm on call bell in reach for next PT session.   Therapy Documentation Precautions:  Precautions Precautions: Fall Precaution Comments: HD access port RUQ, AV graft maturing in Rt UE, spastic Rt hemiplegia at baseline Restrictions Weight Bearing Restrictions: No    Therapy/Group: Individual Therapy  Viona Gilmore 10/23/2020, 7:17 AM

## 2020-10-23 NOTE — Progress Notes (Signed)
Physical Therapy Session Note  Patient Details  Name: Hannah [name] MRN: [id number] Date of Birth: [birth year]  Today's Date: 10/23/2020 PT Individual Time: 1003-1059 PT Individual Time Calculation (min): 56 min   Short Term Goals: Week 1:  PT Short Term Goal 1 (Week 1): Pt will be able to perform bed mobility with supervision PT Short Term Goal 2 (Week 1): Pt will be able to perform transfers with CGA PT Short Term Goal 3 (Week 1): Pt will be able to gait x 50' with min assist PT Short Term Goal 4 (Week 1): Pt will be able to perform curb step negotiion with RW with mod assist  Skilled Therapeutic Interventions/Progress Updates:     Pt received seated in [facility] and agrees to therapy. No complaint of pain. WC transport outside for time management and energy conservation. Session focused on NMR for standing balance and activity tolerance. Pt performs many aspects of Berg but does not formally complete assessment. Pt able to perform multiple reps of sit to stand throughout session with verbal cues on sequencing and hand placement. Pt able to stand with very close supervision, with PT cueing for upright gaze and engage gluteals to promote hip extension and improved posture. Pt also performs static standing with feet together to challenge balance with smaller BOS, requiring minA. Pt turns 360 degrees to the R, very slowly and with modA and frequent cueing to increase hip extension and upright trunk and gaze. Pt performs marching in place progressing to forward toe-taps with minA at hips to facilitate rotation and stability. Pt also performs dynamic upper body movements in rhythm with music with CGA/minA. Frequent seated rest breaks taken during session and pt with multiple slight LOBs, typically requiring minA. WC transport back to room. Left seated with alarm intact and all needs within reach.  Therapy Documentation Precautions:  Precautions Precautions: Fall Precaution Comments: HD access port RUQ, AV  graft maturing in Rt UE, spastic Rt hemiplegia at baseline Restrictions Weight Bearing Restrictions: No   Therapy/Group: Individual Therapy  Breck Coons, PT, DPT 10/23/2020, 12:49 PM

## 2020-10-23 NOTE — Progress Notes (Signed)
Occupational Therapy Session Note  Patient Details  Name: Hannah [name] MRN: [id number] Date of Birth: [birth year]  Today's Date: 10/23/2020 OT Individual Time: 0700-0753 OT Individual Time Calculation (min): 53 min    Short Term Goals: Week 1:  OT Short Term Goal 1 (Week 1): Pt will complete LB dressing with CGA at sit<stand level using LRAD as needed OT Short Term Goal 2 (Week 1): Pt will complete an ambulatory toilet transfer with no more than Min A while using LRAD OT Short Term Goal 3 (Week 1): Pt will complete an ambulatory shower transfer with no more than Min A while using LRAD  Skilled Therapeutic Interventions/Progress Updates:     Pt received semi-reclined in bed finishing breakfast, initially denies pain and agreeable to therapy. Came to sitting EOB with distant S + increased time + use of bed features. Stand-pivot with RW to w/c with CGA. Stand-pivot to toilet from w/c with use of front grab bar, CGA for transfer + LB clothing management. Continent void of urine + distant S for seated pericare. Completed facial and hair care seated at sink with set-up A. Pt reports phobia of sinks and requests for it to be covered and to get dressed facing away from sink. Donned shirt with ind, donned bra with light min A for care around HD port. Donned shirt + jacket with close S, pt ind recalls hemi-techniques. Donned pants with light min A to thread RLE over gripper socks. CGA for STS with RW. Pt able to doff/don B socks with close S + increased time, donned B shoes with light min A to adjust shoes over heels. Reports her daughter helps her with this at baseline - may benefit from [facility] shoe horn. W/c transport total A to and from gym 2/2 time management and energy conservation. Stood x2 at [employer] table to copy Visual merchandiser using RUE. Pt able to to stand for 2 min at a time to complete puzzles with 95 to 100% accuracy before req seated rest break. Able to manipulate pieces with increased  time.  Pt left in w/c awaiting PT session with daughter present, call bell in reach, and all immediate needs met.   Therapy Documentation Precautions:  Precautions Precautions: Fall Precaution Comments: HD access port RUQ, AV graft maturing in Rt UE, spastic Rt hemiplegia at baseline Restrictions Weight Bearing Restrictions: No  Pain: c/o 7/10 L IV site pain, RN made aware   ADL: See Care Tool for more details.   Therapy/Group: Individual Therapy  Volanda Napoleon MS, OTR/L   10/23/2020, 6:54 AM

## 2020-10-23 NOTE — Progress Notes (Signed)
Physical Therapy Session Note  Patient Details  Name: Hannah [name] MRN: [id number] Date of Birth: [birth year]  Today's Date: 10/23/2020 PT Individual Time: 0806-0905 PT Individual Time Calculation (min): 59 min   Short Term Goals: Week 1:  PT Short Term Goal 1 (Week 1): Pt will be able to perform bed mobility with supervision PT Short Term Goal 2 (Week 1): Pt will be able to perform transfers with CGA PT Short Term Goal 3 (Week 1): Pt will be able to gait x 50' with min assist PT Short Term Goal 4 (Week 1): Pt will be able to perform curb step negotiion with RW with mod assist    Skilled Therapeutic Interventions/Progress Updates:   Pt received sitting in WC and agreeable to PT.   Gait training with RW x 159f with CGA fading to min assist due to fatigue with decreased step height, and weight shift to the L noted with faituge; able to improve with facilitation from PT. Gait in parallel bars forward/reverse 2 x 881fwith cues for improved symmetry of step length, posture, and hip/knee flexion in stance. Side stepping R and L 2x [id number] facilitation from PT tp prevent pelvic rotation.  Pregait stepping task over hockey stick on floor to preform forward and lateral stepping with min assist to improve posture and cues for activation of the Hip flexors.   Dynamic balance training standing without UE support x 1 minute with CGA-min assist. Lateral reach with HHA x 6 Bil with min assist for weight shifting the RLE. Pt then performed lateral reach with UE support on RW followed by horse shoe toss to target x 6, picking up horse shoes from floor with reacher.   Patient returned to room and [facility] and all needs met.        Therapy Documentation Precautions:  Precautions Precautions: Fall Precaution Comments: HD access port RUQ, AV graft maturing in Rt UE, spastic Rt hemiplegia at baseline Restrictions Weight Bearing Restrictions:  No  Pain: denies    Therapy/Group: Individual Therapy  AusLorie Phenix9/2022, 9:11 AM

## 2020-10-23 NOTE — Evaluation (Signed)
Recreational Therapy Assessment and Plan  Patient Details  Name: Hannah [name] MRN: [id number] Date of Birth: Jun 10, 1965 Today's Date: 10/23/2020  Rehab Potential:  Good ELOS:   d/c 6/17  Assessment  Hospital Problem: Principal Problem:   Debility     Past Medical History:      Past Medical History:  Diagnosis Date   Cerebral infarction ([place]) 07/12/2017    2009 first one, has had a total of 4 stokes   Diabetes type 2, controlled ([place])     Hemiparesis affecting right side as late effect of cerebrovascular accident (CVA) ([place])     Hyperlipidemia     Hypertension     Multiple sclerosis ([place]) 2009   Stroke [facility])      Past Surgical History:       Past Surgical History:  Procedure Laterality Date   APPENDECTOMY       AV FISTULA PLACEMENT Right 10/02/2020    Procedure: RIGHT ARTERIOVENOUS (AV) FISTULA CREATION;  Surgeon: Waynetta Sandy, MD;  Location: [facility];  Service: Vascular;  Laterality: Right;   CYSTOSTOMY W/ BLADDER BIOPSY       IR FLUORO GUIDE CV LINE RIGHT   09/29/2020   IR US GUIDE VASC ACCESS RIGHT   09/29/2020   OVARIAN CYST SURGERY       stent placed in heart          Assessment & Plan Clinical Impression: Patient is a 55 year old female with history of multiple strokes with residual right spastic HP, MS (no treatment), T2DM with progressive nephropathy, multiple recent admissions for acute on chronic renal failure and was admitted on 09/26/20 with fluid overload, malaise and uremia. She was started on IV diuresis with good results but continued to have uremic symptoms. Low iron stores supplemented and she ws started on aranesp for significant anemia due to chronic disease. Dialysis recommended by Dr. Moshe Cipro and after multiple discussions, patient was agreeable to have IJ placed by Dr. Anselm Pancoast on 05/16. MWF HD initiated and she had RUE AV fistula placed by Dr. Donzetta Matters on 05/19. She reported LB and right thigh/flank pain with spasms and was found to have  spontaneous right psoas hematoma on 05/23. Dr. Donne Hazel consulted for input and recommended holding Plavix X 2 weeks. She continued to be limited by pain and weakness. CIR recommended due to recent functional decline.  +constipation Patient transferred to CIR on 10/18/2020.    Pt presents with decreased activity tolerance, decreased functional mobility, decreased balance Limiting pt's independence with leisure/community pursuits.  Met with pt today to discuss leisure interests and community pursuits.  Pt states self limited behavior in regards to community pursuits PTA due to health issues while admitting that she uses COVID as an excuse to stay home.  Education provided on importance of emotional health in addition to physical health and how they affect each other.  Discussed community reintegration and pt agreeable to participate in an outing next week.   Plan   Min 1 session >30 minutes  Recommendations for other services: None   Discharge Criteria: Patient will be discharged from TR if patient refuses treatment 3 consecutive times without medical reason.  If treatment goals not met, if there is a change in medical status, if patient makes no progress towards goals or if patient is discharged from hospital.  The above assessment, treatment plan, treatment alternatives and goals were discussed and mutually agreed upon: by patient  [place] 10/23/2020, 10:32 AM

## 2020-10-24 DIAGNOSIS — I69398 Other sequelae of cerebral infarction: Secondary | ICD-10-CM | POA: Diagnosis not present

## 2020-10-24 DIAGNOSIS — N186 End stage renal disease: Secondary | ICD-10-CM | POA: Diagnosis not present

## 2020-10-24 DIAGNOSIS — R5381 Other malaise: Secondary | ICD-10-CM | POA: Diagnosis not present

## 2020-10-24 DIAGNOSIS — R42 Dizziness and giddiness: Secondary | ICD-10-CM | POA: Diagnosis not present

## 2020-10-24 LAB — GLUCOSE, CAPILLARY
Glucose-Capillary: 120 mg/dL — ABNORMAL HIGH (ref 70–99)
Glucose-Capillary: 339 mg/dL — ABNORMAL HIGH (ref 70–99)
Glucose-Capillary: 283 mg/dL — ABNORMAL HIGH (ref 70–99)
Glucose-Capillary: 393 mg/dL — ABNORMAL HIGH (ref 70–99)

## 2020-10-24 MED ORDER — INSULIN ASPART 100 UNIT/ML IJ SOLN
0.0000 [IU] | Freq: Three times a day (TID) | INTRAMUSCULAR | Status: DC
  Administered 2020-10-24: 4 [IU] via SUBCUTANEOUS
  Administered 2020-10-24: 3 [IU] via SUBCUTANEOUS
  Administered 2020-10-25 (×2): 2 [IU] via SUBCUTANEOUS
  Administered 2020-10-26 (×2): 3 [IU] via SUBCUTANEOUS
  Administered 2020-10-26: 1 [IU] via SUBCUTANEOUS
  Administered 2020-10-27: 6 [IU] via SUBCUTANEOUS
  Administered 2020-10-27: 3 [IU] via SUBCUTANEOUS
  Administered 2020-10-27: 1 [IU] via SUBCUTANEOUS
  Administered 2020-10-28: 2 [IU] via SUBCUTANEOUS
  Administered 2020-10-29: 4 [IU] via SUBCUTANEOUS
  Administered 2020-10-29: 3 [IU] via SUBCUTANEOUS
  Administered 2020-10-30 – 2020-10-31 (×2): 2 [IU] via SUBCUTANEOUS
  Administered 2020-11-01: 1 [IU] via SUBCUTANEOUS
  Administered 2020-11-02: 2 [IU] via SUBCUTANEOUS

## 2020-10-24 MED ORDER — INSULIN GLARGINE 100 UNIT/ML ~~LOC~~ SOLN
12.0000 [IU] | Freq: Every day | SUBCUTANEOUS | Status: DC
  Administered 2020-10-24 – 2020-10-25 (×2): 12 [IU] via SUBCUTANEOUS
  Filled 2020-10-24 (×3): qty 0.12

## 2020-10-24 NOTE — Progress Notes (Signed)
Occupational Therapy Session Note  Patient Details  Name: Hannah [name] MRN: [id number] Date of Birth: 09-15-1965  Today's Date: 10/24/2020 OT Individual Time: [id number] OT Individual Time Calculation (min): 53 min    Short Term Goals: Week 1:  OT Short Term Goal 1 (Week 1): Pt will complete LB dressing with CGA at sit<stand level using LRAD as needed OT Short Term Goal 2 (Week 1): Pt will complete an ambulatory toilet transfer with no more than Min A while using LRAD OT Short Term Goal 3 (Week 1): Pt will complete an ambulatory shower transfer with no more than Min A while using LRAD  Skilled Therapeutic Interventions/Progress Updates:    Pt greeted in bed with no c/o pain. Requesting to start session by using the restroom. Bed mobility completed with setup and increased time, using the bedrail with HOB elevated. She ambulated using the RW with CGA to the elevated toilet. Noted increased tone in the Rt side during ambulation but no overt LOBs. Assistance for lowering clothing with pt having +bladder void. She completed hygiene and elevated brief over hips with CGA for balance assistance. She then ambulated to the w/c parked in front of the sink. She engaged in bathing/dressing tasks at sit<stand level, using the RW for standing support as needed. She needed Min A to meet demands of LB dressing tasks, vcs also for increasing functional use of  the Rt hand for tone mgt. CGA for dynamic standing balance overall. She opted to sit vs stand to complete oral care due to increased fatigue at this point in session. At end of session pt remained sitting up in the w/c, set up with a therapeutic activity from rec therapist. OT educated pt on ways to also incorporate the Rt hand while participating. All needs within reach and chair alarm set. Tx focus placed on functional transfers, ADL retraining, balance, and activity tolerance today.   Therapy Documentation Precautions:  Precautions Precautions:  Fall Precaution Comments: HD access port RUQ, AV graft maturing in Rt UE, spastic Rt hemiplegia at baseline Restrictions Weight Bearing Restrictions: No   ADL: ADL Eating: Not assessed Grooming: Setup Where Assessed-Grooming: Edge of bed Upper Body Bathing: Supervision/safety Where Assessed-Upper Body Bathing: Edge of bed Lower Body Bathing: Contact guard Where Assessed-Lower Body Bathing: Edge of bed Upper Body Dressing: Supervision/safety Where Assessed-Upper Body Dressing: Edge of bed Lower Body Dressing: Minimal assistance Where Assessed-Lower Body Dressing: Edge of bed Toileting: Not assessed Toilet Transfer: Not assessed Tub/Shower Transfer: Not assessed     Therapy/Group: Individual Therapy  Roverto Bodmer A Quinta Eimer 10/24/2020, 3:15 PM

## 2020-10-24 NOTE — Progress Notes (Signed)
Patient ID: Hannah [name], female   DOB: 1966/04/10, 55 y.o.   MRN: [id number] S: Feels well, no complaints O:BP (!) 155/77 (BP Location: Left Arm)   Pulse 86   Temp 98.1 F (36.7 C)   Resp 18   Wt 55.2 kg   SpO2 100%   BMI 22.26 kg/m   Intake/Output Summary (Last 24 hours) at 10/24/2020 1336 Last data filed at 10/24/2020 1313 Gross per 24 hour  Intake 645 ml  Output 1500 ml  Net -855 ml   Intake/Output: I/O last 3 completed shifts: In: 118 [P.O.:118] Out: 1500 [Other:1500]  Intake/Output this shift:  Total I/O In: 645 [P.O.:645] Out: -  Weight change: -2.4 kg Gen: NAD CVS: RRR Resp: CTA Abd: benign Ext: no edema, RUE AVF +T/B  Recent Labs  Lab 10/18/20 0156 10/19/20 0554 10/20/20 0740 10/21/20 0531 10/22/20 0552 10/23/20 1351  NA 131* 133* 131* 132* 133* 135  K 4.0 4.0 4.5 4.4 3.9 3.5  CL 94* 96* 95* 94* 96* 100  CO2 '26 29 26 27 29 27  '$ GLUCOSE 135* 55* 264* 146* 109* 194*  BUN 29* 13 20 33* 10 12  CREATININE 6.61* 3.68* 5.61* 6.63* 3.43* 3.93*  ALBUMIN 2.6* 2.9* 2.7* 2.9* 2.8* 3.1*  CALCIUM 8.6* 9.1 8.5* 9.0 8.6* 8.7*  PHOS 5.5* 3.8 5.2* 6.2* 3.8 3.2   Liver Function Tests: Recent Labs  Lab 10/21/20 0531 10/22/20 0552 10/23/20 1351  ALBUMIN 2.9* 2.8* 3.1*   No results for input(s): LIPASE, AMYLASE in the last 168 hours. No results for input(s): AMMONIA in the last 168 hours. CBC: Recent Labs  Lab 10/18/20 1259 10/21/20 1443 10/23/20 1351  WBC 7.8 7.2 6.7  HGB 9.0* 9.6* 10.4*  HCT 29.2* 31.1* 34.6*  MCV 97.0 96.9 98.3  PLT 357 267 226   Cardiac Enzymes: No results for input(s): CKTOTAL, CKMB, CKMBINDEX, TROPONINI in the last 168 hours. CBG: Recent Labs  Lab 10/23/20 1142 10/23/20 1736 10/23/20 2135 10/24/20 0619 10/24/20 1155  GLUCAP 228* 129* 289* 120* 339*    Iron Studies: No results for input(s): IRON, TIBC, TRANSFERRIN, FERRITIN in the last 72 hours. Studies/Results: No results found.  amLODipine  10 mg Oral Daily    carvedilol  12.5 mg Oral BID WC   Chlorhexidine Gluconate Cloth  6 each Topical Q0600   clopidogrel  75 mg Oral Daily   dantrolene  50 mg Oral QHS   darbepoetin (ARANESP) injection - DIALYSIS  200 mcg Intravenous Q Tue-HD   diclofenac Sodium  2 g Topical QID   famotidine  20 mg Oral Daily   gabapentin  100 mg Oral QHS   hydrALAZINE  50 mg Oral Q8H   insulin aspart  0-6 Units Subcutaneous TID WC   insulin glargine  12 Units Subcutaneous QHS   melatonin  3 mg Oral QHS   polyethylene glycol  17 g Oral Daily   rosuvastatin  10 mg Oral Daily   senna-docusate  1 tablet Oral BID   tiZANidine  2 mg Oral QHS    BMET    Component Value Date/Time   NA 135 10/23/2020 1351   K 3.5 10/23/2020 1351   CL 100 10/23/2020 1351   CO2 27 10/23/2020 1351   GLUCOSE 194 (H) 10/23/2020 1351   BUN 12 10/23/2020 1351   CREATININE 3.93 (H) 10/23/2020 1351   CALCIUM 8.7 (L) 10/23/2020 1351   GFRNONAA 13 (L) 10/23/2020 1351   CBC    Component Value Date/Time   WBC 6.7  10/23/2020 1351   RBC 3.52 (L) 10/23/2020 1351   HGB 10.4 (L) 10/23/2020 1351   HCT 34.6 (L) 10/23/2020 1351   PLT 226 10/23/2020 1351   MCV 98.3 10/23/2020 1351   MCH 29.5 10/23/2020 1351   MCHC 30.1 10/23/2020 1351   RDW 18.1 (H) 10/23/2020 1351   LYMPHSABS 1.3 09/26/2020 2127   MONOABS 0.7 09/26/2020 2127   EOSABS 0.3 09/26/2020 2127   BASOSABS 0.0 09/26/2020 2127   Assessment/Plan:   Recent CVA with right sided spastic hemiparesis - now in CIR. ESRD - continue with HD on TTS schedule.  Accepted to [facility] second shift after discharge.  Complained of weakness and dizziness after HD on 10/18/20 so UF of 1 liter with HD 10/21/20 and she felt much better.  Goal EDW is now 55.5 kg. Anemia: due to ESRD.  On high dose ESA. HGB 9.6. Transfuse prn CKD-MBD: WNL, no meds Nutrition: renal diet, carb modified Hypertension: stable, less UF as above. Vascular access: First stage, RUE brachiobasilic AVF created [id number] by Dr. Carola Frost, MD [facility] [phone]

## 2020-10-24 NOTE — Progress Notes (Signed)
Recreational Therapy Session Note  Patient Details  Name: Hannah [name] MRN: [id number] Date of Birth: Jun 05, 1965 Today's Date: 10/24/2020  Pain: no c/o Skilled Therapeutic Interventions/Progress Updates: Session focused on community reintegration education, potential outing locations, goals, addressing feelings of anxiety about community pursuits.  Discussion with pt about outpatient dialysis and use of time while at dialysis center.  Pt worried about what she will do while she's there.  Discussed taking a bag with tabletop activities (word search,crossword puzzle books, coloring pages & pencils) for use.  Also provided diversional activities for pt use while in the room and at dialysis during hospitalization.  Pt appreciative.  Therapy/Group: Individual Therapy  Lamyia Cdebaca 10/24/2020, 11:36 AM

## 2020-10-24 NOTE — Progress Notes (Signed)
Occupational Therapy Session Note  Patient Details  Name: Hannah [name] MRN: [id number] Date of Birth: 12-Feb-1966  Today's Date: 10/24/2020 OT Individual Time: [id number] OT Individual Time Calculation (min): 24 min    Short Term Goals: Week 1:  OT Short Term Goal 1 (Week 1): Pt will complete LB dressing with CGA at sit<stand level using LRAD as needed OT Short Term Goal 2 (Week 1): Pt will complete an ambulatory toilet transfer with no more than Min A while using LRAD OT Short Term Goal 3 (Week 1): Pt will complete an ambulatory shower transfer with no more than Min A while using LRAD  Skilled Therapeutic Interventions/Progress Updates:    Pt participated in rhythmic drumming group. Pain not reported during session. Focus of group on BUE coordination, strengthening, endurance, timing/control, activity tolerance, and social participation and engagement. Pt performs session from seated position for energy conservation. Skilled interventions included providing foam handle for RUE and encouraging AAROM for certain moves. Warm up performed prior to exercises and UB stretching completed at end of group with demo from OT. Pt able to select preferred song to share with group. Pt tearful after song and reports, "im going through something only God can get me through but I love the music." Returned pt to room at end of session. Exited session with pt seated in bed, exit alarm on and call light in reach  Therapy Documentation Precautions:  Precautions Precautions: Fall Precaution Comments: HD access port RUQ, AV graft maturing in Rt UE, spastic Rt hemiplegia at baseline Restrictions Weight Bearing Restrictions: No General:   Vital Signs: Therapy Vitals Temp: 98.1 F (36.7 C) Pulse Rate: 86 Resp: 18 BP: (!) 155/77 Patient Position (if appropriate): Lying Oxygen Therapy SpO2: 100 % O2 Device: Room Air Pain:   ADL: ADL Eating: Not assessed Grooming: Setup Where Assessed-Grooming: Edge of  bed Upper Body Bathing: Supervision/safety Where Assessed-Upper Body Bathing: Edge of bed Lower Body Bathing: Contact guard Where Assessed-Lower Body Bathing: Edge of bed Upper Body Dressing: Supervision/safety Where Assessed-Upper Body Dressing: Edge of bed Lower Body Dressing: Minimal assistance Where Assessed-Lower Body Dressing: Edge of bed Toileting: Not assessed Toilet Transfer: Not assessed Tub/Shower Transfer: Not assessed Vision   Perception    Praxis   Exercises:   Other Treatments:     Therapy/Group: Group Therapy  Tonny Branch 10/24/2020, 6:58 AM

## 2020-10-24 NOTE — Progress Notes (Addendum)
Physical Therapy Session Note  Patient Details  Name: Hannah [name] MRN: [id number] Date of Birth: March 11, 1966  Today's Date: 10/24/2020 PT Individual Time: 1105-11200 PT Individual Time Calculation (min): 55 min   Short Term Goals: Week 1:  PT Short Term Goal 1 (Week 1): Pt will be able to perform bed mobility with supervision PT Short Term Goal 2 (Week 1): Pt will be able to perform transfers with CGA PT Short Term Goal 3 (Week 1): Pt will be able to gait x 50' with min assist PT Short Term Goal 4 (Week 1): Pt will be able to perform curb step negotiion with RW with mod assist   Skilled Therapeutic Interventions/Progress Updates:    Pt received in wheelchair and agreeable to therapy, reports no pain. Today's session focused on gait in community environments, building confidence, and use of outdoor space to improve mental health and wellbeing. Pt transported off unit via w/c to practice gait in community environment. Pt performed gait with RW and min A over sloping gently concrete (x100 ft) and flat brick (x 100 ft). Pt required minimal cues for gait mechanics due to demonstrating toe walking on R side. Pt directed in attempting WC mobility with pt unable to functionally mobilize WC on brick surface limited with RUE and pt reported being very fatigued. Pt was transported back to room in [employer] total A for time and was left in [facility] with all needs in reach and chair alarm active. Pt educated on stress relief techniques with mindfulness and diaphragm breathing during session and importance of community re-integration with WC mobility and walker use with improving pt's barriers with self image when in public.  Pt more agreeable and reported decreased stress with this.  Therapy Documentation Precautions:  Precautions Precautions: Fall Precaution Comments: HD access port RUQ, AV graft maturing in Rt UE, spastic Rt hemiplegia at baseline Restrictions Weight Bearing Restrictions: No     Therapy/Group:  Individual Therapy  Junie Panning, PT, DPT 10/24/2020, 4:11 PM

## 2020-10-24 NOTE — Progress Notes (Signed)
PROGRESS NOTE   Subjective/Complaints:  Pt reports doing well- had "hot" feeling yesterday after HD- just hot all over- no fever.  Also itching won't stop right after HD- had to take benadryl x2 since HD yesterday.  Otherwise is "wonderful".  Hasn't noticed any change in RUE/tone.    ROS:  Pt denies SOB, abd pain, CP, N/V/C/D, and vision changes   Objective:   No results found. Recent Labs    10/21/20 1443 10/23/20 1351  WBC 7.2 6.7  HGB 9.6* 10.4*  HCT 31.1* 34.6*  PLT 267 226   Recent Labs    10/22/20 0552 10/23/20 1351  NA 133* 135  K 3.9 3.5  CL 96* 100  CO2 29 27  GLUCOSE 109* 194*  BUN 10 12  CREATININE 3.43* 3.93*  CALCIUM 8.6* 8.7*    Intake/Output Summary (Last 24 hours) at 10/24/2020 0833 Last data filed at 10/24/2020 0731 Gross per 24 hour  Intake 240 ml  Output 1500 ml  Net -1260 ml        Physical Exam: Vital Signs Blood pressure (!) 155/77, pulse 86, temperature 98.1 F (36.7 C), resp. rate 18, weight 55.2 kg, SpO2 100 %.       General: awake, alert, appropriate, sitting up in bed; on phone; NAD HENT: conjugate gaze; oropharynx moist; R facial weakness -chronic CV: regular rate; no JVD Pulmonary: CTA B/L; no W/R/R- good air movement GI: soft, NT, ND, (+)BS Psychiatric: appropriate- bright affect Neurological: alert- MAS of 1+ to 2 in RUE esp in elbow- slightly better with increase Dantrolene.  Skin: intact except R chest HD catheter Neurological:     Right upper extremity with 4/5 hand grip- strength otherwise intact. RLE with 4/5 proximally and 3/5 distally. Left sided strength intact   Assessment/Plan: 1. Functional deficits which require 3+ hours per day of interdisciplinary therapy in a comprehensive inpatient rehab setting. Physiatrist is providing close team supervision and 24 hour management of active medical problems listed below. Physiatrist and rehab team continue  to assess barriers to discharge/monitor patient progress toward functional and medical goals  Care Tool:  Bathing    Body parts bathed by patient: Right arm, Left arm, Chest, Abdomen, Face (declined LB today)         Bathing assist Assist Level: Supervision/Verbal cueing     Upper Body Dressing/Undressing Upper body dressing   What is the patient wearing?: Pull over shirt    Upper body assist Assist Level: Minimal Assistance - Patient > 75%    Lower Body Dressing/Undressing Lower body dressing      What is the patient wearing?: Pants     Lower body assist Assist for lower body dressing: Minimal Assistance - Patient > 75%     Toileting Toileting Toileting Activity did not occur (Clothing management and hygiene only): N/A (no void or bm)  Toileting assist Assist for toileting: Contact Guard/Touching assist     Transfers Chair/bed transfer  Transfers assist     Chair/bed transfer assist level: Contact Guard/Touching assist Chair/bed transfer assistive device: Programmer, multimedia   Ambulation assist      Assist level: Contact Guard/Touching assist Assistive device: Walker-rolling Max  distance: 28f   Walk 10 feet activity   Assist     Assist level: Contact Guard/Touching assist Assistive device: Walker-rolling   Walk 50 feet activity   Assist Walk 50 feet with 2 turns activity did not occur: Safety/medical concerns (fatigue)  Assist level: Contact Guard/Touching assist Assistive device: Walker-rolling    Walk 150 feet activity   Assist Walk 150 feet activity did not occur: Safety/medical concerns         Walk 10 feet on uneven surface  activity   Assist Walk 10 feet on uneven surfaces activity did not occur: Safety/medical concerns         Wheelchair     Assist Will patient use wheelchair at discharge?: Yes Type of Wheelchair:  (uses power chair at home sometimes on bad days)    Wheelchair assist level:  Dependent - Patient 0%      Wheelchair 50 feet with 2 turns activity    Assist        Assist Level: Dependent - Patient 0%   Wheelchair 150 feet activity     Assist      Assist Level: Dependent - Patient 0%   Blood pressure (!) 155/77, pulse 86, temperature 98.1 F (36.7 C), resp. rate 18, weight 55.2 kg, SpO2 100 %.   Medical Problem List and Plan: 1.  Cardiopulmonary debility             -patient may shower             -ELOS/Goals: 2-3 weeks MinA             con't PT and OT- d/c 6/17 2.  Antithrombotics: -DVT/anticoagulation:  Mechanical: Sequential compression devices, below knee Bilateral lower extremities             -antiplatelet therapy: Plavix on hold until 6/9, discussed with patient.  6/9- restarted Plavix- pt also asked about it.  3. Pain Management: Continue Oxycodone prn.              -on Tizanidine 2 mg tid as per home regimen.  Muscle relaxer's contraindicated with HD--->changed to 2 mg HS per recommendations.   6/8- pain itself is controlled- spasticity as below             --Continue low dose gabapentin.  4. Mood: LCSW to follow for evaluation and support.              -antipsychotic agents: N/A 5. Neuropsych: This patient is capable of making decisions on her own behalf. 6. Skin/Wound Care: Routine pressure relief measures.  7. Fluids/Electrolytes/Nutrition: Strict I/O.  8. ESRD: Now on HD MWF--schedule at the end of the day             --[facility]- pt is upset- feeling like they've taken too much fluid off- I'm not clear if they need to add midodrine, etc?  6/8- pt felt much better after HD this time- got 1L taken off- con't regimen per renal  6/10- having "hot" and itching feelings after HD- I know itching is normal- will check in the "hot" feeling- no fever 9. HTN: Monitor BP--continue Norvasc, Coreg bid, apresoline tid   6/8- BP slightly elevated today- will monitor for trend before changing meds    6/10- BP still a little elevated, but stable- might need to increase Coreg, however will see what Nephrology says- don't want to drop her BP during HD.  10 Hyponatremia: SSRI d/c by nephrology  with slow improved.  11. Nausea: Continue reglan bid-> wean to off?  6/8- will make reglan prn- BID prn- so can ask for if needs it.  12. Constipation: Managed with senna S bid and miralax daily.   6/7- LBM 2 days ago- normally goes 1-2x/week, so normal for her- if no BM by tomorrow, will intervene  6/8- LBM last night- con't regimen 13. Hypoglycemia with hx of DM: decrease Lantus HS to 9U.  6/6- BGs 167-196 in last 24 hours- will give it 24 hours and titrate Lantus as required  6/7- will increase Lantus to 10 units nightly- and monitor  6/8- BGs much better since increased Lantus last night- 107-185- vs in 200s the whole time- con't to monitor  6/9- Bgs 150s-mid 200s - might need Novolog with meals?- will d/w pt.   6/10- will try to increase Lantus to 12 units- will monitor 14. Spasticity  6/6- will start Dantrolene 25 mg  6/8- if still doing well tommorow will increase to 50 mg QHS  6/9- increased dantrolene to 50 mg QHS- will se ehow tolerates.   6/10- spasticity a little better- con't titration of Dantrolene.  15. Hypotension after HD  6/6- might do better with midodrine or decreasing BP meds? Pt thinks it's due to taking too much fluid off.   6/7- will let HD team know with report today  6/8- Sx's resolved with taking off less fluid.    LOS: 6 days A FACE TO FACE EVALUATION WAS PERFORMED  Hannah [name] 10/24/2020, 8:33 AM

## 2020-10-24 NOTE — Progress Notes (Signed)
Occupational Therapy Session Note  Patient Details  Name: Meela Manalili MRN: [id number] Date of Birth: 1965/05/28  Today's Date: 10/24/2020 OT Individual Time: [id number] OT Individual Time Calculation (min): 24 min    Short Term Goals: Week 1:  OT Short Term Goal 1 (Week 1): Pt will complete LB dressing with CGA at sit<stand level using LRAD as needed OT Short Term Goal 2 (Week 1): Pt will complete an ambulatory toilet transfer with no more than Min A while using LRAD OT Short Term Goal 3 (Week 1): Pt will complete an ambulatory shower transfer with no more than Min A while using LRAD  Skilled Therapeutic Interventions/Progress Updates:    Pt received seated in w/c, denies pain, agreeable to therapy. Session focus on RUE NMR, dynamic standing balance, BLE strengthening, and activity tolerance in prep for improved ADL performance. W/c transport to gym total A 2/2 time management/ energy conservation. Stood to complete three games at [place] with close S  + use of RW. Req min Vcs to locate targets on the far ends of the screen when playing single target game. Pt unable to isolate index finger to hit target, opting to use her 3rd digit instead. Additionally, completed Bell Cancellation task, missing 8 targets. Demonstrated disorganized search pattern, discussed how to incorporate organized search pattern into daily routine. Reports no difficulty locating words on a page/objects in her environment, but states she often has to read text multiple times to fully comprehend. Finally, completed 5 min on Kinetron at 20 cm/sec, no c/o fatigue post session.  Pt left in Day Room awaiting dance group.    Therapy Documentation Precautions:  Precautions Precautions: Fall Precaution Comments: HD access port RUQ, AV graft maturing in Rt UE, spastic Rt hemiplegia at baseline Restrictions Weight Bearing Restrictions: No  Pain: denies   ADL: See Care Tool for more details.   Therapy/Group: Individual  Therapy  Volanda Napoleon MS, OTR/L   10/24/2020, 6:49 AM

## 2020-10-25 DIAGNOSIS — I69398 Other sequelae of cerebral infarction: Secondary | ICD-10-CM | POA: Diagnosis not present

## 2020-10-25 DIAGNOSIS — N186 End stage renal disease: Secondary | ICD-10-CM | POA: Diagnosis not present

## 2020-10-25 DIAGNOSIS — R42 Dizziness and giddiness: Secondary | ICD-10-CM | POA: Diagnosis not present

## 2020-10-25 DIAGNOSIS — R5381 Other malaise: Secondary | ICD-10-CM | POA: Diagnosis not present

## 2020-10-25 LAB — CBC
HCT: 35.1 % — ABNORMAL LOW (ref 36.0–46.0)
Hemoglobin: 10.3 g/dL — ABNORMAL LOW (ref 12.0–15.0)
MCH: 29.3 pg (ref 26.0–34.0)
MCHC: 29.3 g/dL — ABNORMAL LOW (ref 30.0–36.0)
MCV: 100 fL (ref 80.0–100.0)
Platelets: 257 10*3/uL (ref 150–400)
RBC: 3.51 MIL/uL — ABNORMAL LOW (ref 3.87–5.11)
RDW: 18.4 % — ABNORMAL HIGH (ref 11.5–15.5)
WBC: 6 10*3/uL (ref 4.0–10.5)
nRBC: 0.3 % — ABNORMAL HIGH (ref 0.0–0.2)

## 2020-10-25 LAB — RENAL FUNCTION PANEL
Albumin: 2.9 g/dL — ABNORMAL LOW (ref 3.5–5.0)
Anion gap: 9 (ref 5–15)
BUN: 18 mg/dL (ref 6–20)
CO2: 26 mmol/L (ref 22–32)
Calcium: 9 mg/dL (ref 8.9–10.3)
Chloride: 97 mmol/L — ABNORMAL LOW (ref 98–111)
Creatinine, Ser: 5.39 mg/dL — ABNORMAL HIGH (ref 0.44–1.00)
GFR, Estimated: 9 mL/min — ABNORMAL LOW (ref >60)
Glucose, Bld: 258 mg/dL — ABNORMAL HIGH (ref 70–99)
Phosphorus: 4.4 mg/dL (ref 2.5–4.6)
Potassium: 3.8 mmol/L (ref 3.5–5.1)
Sodium: 132 mmol/L — ABNORMAL LOW (ref 135–145)

## 2020-10-25 LAB — GLUCOSE, CAPILLARY
Glucose-Capillary: 210 mg/dL — ABNORMAL HIGH (ref 70–99)
Glucose-Capillary: 232 mg/dL — ABNORMAL HIGH (ref 70–99)
Glucose-Capillary: 142 mg/dL — ABNORMAL HIGH (ref 70–99)
Glucose-Capillary: 147 mg/dL — ABNORMAL HIGH (ref 70–99)

## 2020-10-25 MED ORDER — INSULIN ASPART 100 UNIT/ML IJ SOLN
0.0000 [IU] | Freq: Every day | INTRAMUSCULAR | Status: DC
  Administered 2020-10-27: 2 [IU] via SUBCUTANEOUS
  Administered 2020-10-30: 3 [IU] via SUBCUTANEOUS

## 2020-10-25 MED ORDER — INSULIN ASPART 100 UNIT/ML IJ SOLN: 0.0000 [IU] | Freq: Three times a day (TID) | INTRAMUSCULAR | Status: DC

## 2020-10-25 MED ORDER — HEPARIN SODIUM (PORCINE) 1000 UNIT/ML DIALYSIS
20.0000 [IU]/kg | INTRAMUSCULAR | Status: DC | PRN
  Filled 2020-10-25 (×2): qty 2

## 2020-10-25 MED ORDER — HEPARIN SODIUM (PORCINE) 1000 UNIT/ML IJ SOLN
INTRAMUSCULAR | Status: AC
  Administered 2020-10-25: 3200 [IU] via INTRAVENOUS_CENTRAL
  Filled 2020-10-25: qty 4

## 2020-10-25 NOTE — Progress Notes (Signed)
PROGRESS NOTE   Subjective/Complaints:  Pt reports doing well- BG got high last night and only got Lantus- no QHS SSI- will restart- .  ROS:  Pt denies SOB, abd pain, CP, N/V/C/D, and vision changes   Objective:   No results found. Recent Labs    10/23/20 1351 10/25/20 1330  WBC 6.7 6.0  HGB 10.4* 10.3*  HCT 34.6* 35.1*  PLT 226 257   Recent Labs    10/23/20 1351 10/25/20 1330  NA 135 132*  K 3.5 3.8  CL 100 97*  CO2 27 26  GLUCOSE 194* 258*  BUN 12 18  CREATININE 3.93* 5.39*  CALCIUM 8.7* 9.0    Intake/Output Summary (Last 24 hours) at 10/25/2020 2024 Last data filed at 10/25/2020 1642 Gross per 24 hour  Intake 320 ml  Output 1000 ml  Net -680 ml        Physical Exam: Vital Signs Blood pressure (!) 150/71, pulse 93, temperature 98.4 F (36.9 C), temperature source Oral, resp. rate 17, weight 53.4 kg, SpO2 98 %.       General: awake, alert, appropriate, sitting up in bed today; NAD HENT: conjugate gaze; oropharynx moist CV: regular rate; no JVD Pulmonary: CTA B/L; no W/R/R- good air movement GI: soft, NT, ND, (+)BS; normoactive Psychiatric: appropriate Neurological: alert- MAS of 1+ to 2 in RUE esp in elbow- slightly better with increase Dantrolene. - no change today Skin: intact except R chest HD catheter Neurological:     Right upper extremity with 4/5 hand grip- strength otherwise intact. RLE with 4/5 proximally and 3/5 distally. Left sided strength intact   Assessment/Plan: 1. Functional deficits which require 3+ hours per day of interdisciplinary therapy in a comprehensive inpatient rehab setting. Physiatrist is providing close team supervision and 24 hour management of active medical problems listed below. Physiatrist and rehab team continue to assess barriers to discharge/monitor patient progress toward functional and medical goals  Care Tool:  Bathing    Body parts bathed by  patient: Right arm, Left arm, Chest, Abdomen, Face, Front perineal area, Buttocks, Right upper leg, Left upper leg, Right lower leg, Left lower leg         Bathing assist Assist Level: Contact Guard/Touching assist     Upper Body Dressing/Undressing Upper body dressing   What is the patient wearing?: Pull over shirt, Bra    Upper body assist Assist Level: Supervision/Verbal cueing    Lower Body Dressing/Undressing Lower body dressing      What is the patient wearing?: Pants, Underwear/pull up     Lower body assist Assist for lower body dressing: Contact Guard/Touching assist     Toileting Toileting Toileting Activity did not occur (Clothing management and hygiene only): N/A (no void or bm)  Toileting assist Assist for toileting: Minimal Assistance - Patient > 75%     Transfers Chair/bed transfer  Transfers assist     Chair/bed transfer assist level: Contact Guard/Touching assist Chair/bed transfer assistive device: Programmer, multimedia   Ambulation assist      Assist level: Contact Guard/Touching assist Assistive device: Walker-rolling Max distance: 160   Walk 10 feet activity   Assist  Assist level: Contact Guard/Touching assist Assistive device: Walker-rolling   Walk 50 feet activity   Assist Walk 50 feet with 2 turns activity did not occur: Safety/medical concerns (fatigue)  Assist level: Contact Guard/Touching assist Assistive device: Walker-rolling    Walk 150 feet activity   Assist Walk 150 feet activity did not occur: Safety/medical concerns  Assist level: Contact Guard/Touching assist Assistive device: Walker-rolling    Walk 10 feet on uneven surface  activity   Assist Walk 10 feet on uneven surfaces activity did not occur: Safety/medical concerns         Wheelchair     Assist Will patient use wheelchair at discharge?: Yes Type of Wheelchair:  (uses power chair at home sometimes on bad days)     Wheelchair assist level: Dependent - Patient 0%      Wheelchair 50 feet with 2 turns activity    Assist        Assist Level: Dependent - Patient 0%   Wheelchair 150 feet activity     Assist      Assist Level: Dependent - Patient 0%   Blood pressure (!) 150/71, pulse 93, temperature 98.4 F (36.9 C), temperature source Oral, resp. rate 17, weight 53.4 kg, SpO2 98 %.   Medical Problem List and Plan: 1.  Cardiopulmonary debility             -patient may shower             -ELOS/Goals: 2-3 weeks MinA             con't PT and OT- d/c 6/17 2.  Antithrombotics: -DVT/anticoagulation:  Mechanical: Sequential compression devices, below knee Bilateral lower extremities             -antiplatelet therapy: Plavix on hold until 6/9, discussed with patient.  6/9- restarted Plavix- pt also asked about it.  3. Pain Management: Continue Oxycodone prn.              -on Tizanidine 2 mg tid as per home regimen.  Muscle relaxer's contraindicated with HD--->changed to 2 mg HS per recommendations.   6/8- pain itself is controlled- spasticity as below  6/11- pain is controlled- con't regimen             --Continue low dose gabapentin.  4. Mood: LCSW to follow for evaluation and support.              -antipsychotic agents: N/A 5. Neuropsych: This patient is capable of making decisions on her own behalf. 6. Skin/Wound Care: Routine pressure relief measures.  7. Fluids/Electrolytes/Nutrition: Strict I/O.  8. ESRD: Now on HD MWF--schedule at the end of the day             --has been clipped for [facility] [place]  6/7- pt is upset- feeling like they've taken too much fluid off- I'm not clear if they need to add midodrine, etc?  6/8- pt felt much better after HD this time- got 1L taken off- con't regimen per renal  6/10- having "hot" and itching feelings after HD- I know itching is normal- will check in the "hot" feeling- no fever  6/11- ready for HD today- con't regimen 9. HTN:  Monitor BP--continue Norvasc, Coreg bid, apresoline tid   6/8- BP slightly elevated today- will monitor for trend before changing meds   6/10- BP still a little elevated, but stable- might need to increase Coreg, however will see what Nephrology says- don't want to drop her BP during HD.  6/11- BP 150s/70s- con't to monitor/per Renal  10 Hyponatremia: SSRI d/c by nephrology with slow improved.  11. Nausea: Continue reglan bid-> wean to off?  6/8- will make reglan prn- BID prn- so can ask for if needs it.  12. Constipation: Managed with senna S bid and miralax daily.   6/7- LBM 2 days ago- normally goes 1-2x/week, so normal for her- if no BM by tomorrow, will intervene  6/8- LBM last night- con't regimen 13. Hypoglycemia with hx of DM: decrease Lantus HS to 9U.  6/6- BGs 167-196 in last 24 hours- will give it 24 hours and titrate Lantus as required  6/7- will increase Lantus to 10 units nightly- and monitor  6/8- BGs much better since increased Lantus last night- 107-185- vs in 200s the whole time- con't to monitor  6/9- Bgs 150s-mid 200s - might need Novolog with meals?- will d/w pt.   6/10- will try to increase Lantus to 12 units- will monitor  6/11- restart QHS SSI- con't regimen 14. Spasticity  6/6- will start Dantrolene 25 mg  6/8- if still doing well tommorow will increase to 50 mg QHS  6/9- increased dantrolene to 50 mg QHS- will se ehow tolerates.   6/10- spasticity a little better- con't titration of Dantrolene. 6/11- will increase before d/c.   15. Hypotension after HD  6/6- might do better with midodrine or decreasing BP meds? Pt thinks it's due to taking too much fluid off.   6/7- will let HD team know with report today  6/8- Sx's resolved with taking off less fluid.    LOS: 7 days A FACE TO FACE EVALUATION WAS PERFORMED  Bailyn Spackman 10/25/2020, 8:24 PM

## 2020-10-25 NOTE — Progress Notes (Signed)
Occupational Therapy Session Note  Patient Details  Name: Hannah [name] MRN: [id number] Date of Birth: 01/13/1966  Today's Date: 10/25/2020 OT Individual Time: 1103-1159 OT Individual Time Calculation (min): 56 min    Short Term Goals: Week 1:  OT Short Term Goal 1 (Week 1): Pt will complete LB dressing with CGA at sit<stand level using LRAD as needed OT Short Term Goal 2 (Week 1): Pt will complete an ambulatory toilet transfer with no more than Min A while using LRAD OT Short Term Goal 3 (Week 1): Pt will complete an ambulatory shower transfer with no more than Min A while using LRAD  Skilled Therapeutic Interventions/Progress Updates:    Pt greeted in the w/c, ADL needs reportedly met. She was asking to go outside. Escorted her via w/c outdoors and sat in front of the water fountain. Worked on UB strengthening/endurance + Rt UE NMR by using the 3# bar x10 reps each exercise, min facilitation provided to the Rt elbow at times to offset hypertonicity. Dynamic standing balance challenges by ambulating over uneven terrain and up/down small inclines using device, CGA for balance overall with 1 small LOB when transferring to the bench. Per pt "I'm sorry. I'm really tired." Min A to recover balance. Afterwards pt completed stand pivot<w/c and was returned to her room. Pt remained sitting up for lunch, all needs within reach.   Therapy Documentation Precautions:  Precautions Precautions: Fall Precaution Comments: HD access port RUQ, AV graft maturing in Rt UE, spastic Rt hemiplegia at baseline Restrictions Weight Bearing Restrictions: No  Pain: no c/o pain during session   ADL: ADL Eating: Not assessed Grooming: Setup Where Assessed-Grooming: Edge of bed Upper Body Bathing: Supervision/safety Where Assessed-Upper Body Bathing: Edge of bed Lower Body Bathing: Contact guard Where Assessed-Lower Body Bathing: Edge of bed Upper Body Dressing: Supervision/safety Where Assessed-Upper Body  Dressing: Edge of bed Lower Body Dressing: Minimal assistance Where Assessed-Lower Body Dressing: Edge of bed Toileting: Not assessed Toilet Transfer: Not assessed Tub/Shower Transfer: Not assessed     Therapy/Group: Individual Therapy  Michaela A Hoffman 10/25/2020, 12:20 PM 

## 2020-10-25 NOTE — Progress Notes (Signed)
[facility] Progress Note   Subjective:   Patient seen and examined in room.  Sitting in bedside chair, coloring, waiting to go to next PT session.  Tolerating dialysis well the last few HD with smaller goals.  Denies dizziness, n/v, CP, SOB and abdominal pain.   Objective Vitals:   10/24/20 0445 10/24/20 0535 10/24/20 2013 10/25/20 0525  BP: (!) 155/77  130/63 131/68  Pulse: 86  97 95  Resp: '18  16 16  '$ Temp: 98.1 F (36.7 C)  98.4 F (36.9 C) 98.7 F (37.1 C)  TempSrc:   Oral Oral  SpO2: 100%  99% 98%  Weight:  55.2 kg     Physical Exam General:well appearing, pleasant female in NAD sitting in bedside chair Heart:RRR, no mrg Lungs:CTAB, nml WOB Abdomen:soft, NTND Extremities:no LE edema Dialysis Access: [facility], RU AVF +b/t  Filed Weights   10/23/20 1306 10/23/20 1650 10/24/20 0535  Weight: 54.6 kg 53.1 kg 55.2 kg    Intake/Output Summary (Last 24 hours) at 10/25/2020 1201 Last data filed at 10/25/2020 0830 Gross per 24 hour  Intake 617 ml  Output --  Net 617 ml    Additional Objective Labs: Basic Metabolic Panel: Recent Labs  Lab 10/21/20 0531 10/22/20 0552 10/23/20 1351  NA 132* 133* 135  K 4.4 3.9 3.5  CL 94* 96* 100  CO2 '27 29 27  '$ GLUCOSE 146* 109* 194*  BUN 33* 10 12  CREATININE 6.63* 3.43* 3.93*  CALCIUM 9.0 8.6* 8.7*  PHOS 6.2* 3.8 3.2   Liver Function Tests: Recent Labs  Lab 10/21/20 0531 10/22/20 0552 10/23/20 1351  ALBUMIN 2.9* 2.8* 3.1*    CBC: Recent Labs  Lab 10/18/20 1259 10/21/20 1443 10/23/20 1351  WBC 7.8 7.2 6.7  HGB 9.0* 9.6* 10.4*  HCT 29.2* 31.1* 34.6*  MCV 97.0 96.9 98.3  PLT 357 267 226   CBG: Recent Labs  Lab 10/24/20 1155 10/24/20 1645 10/24/20 2107 10/25/20 0558 10/25/20 1159  GLUCAP 339* 283* 393* 210* 232*    Medications:   amLODipine  10 mg Oral Daily   carvedilol  12.5 mg Oral BID WC   Chlorhexidine Gluconate Cloth  6 each Topical Q0600   clopidogrel  75 mg Oral Daily   dantrolene   50 mg Oral QHS   darbepoetin (ARANESP) injection - DIALYSIS  200 mcg Intravenous Q Tue-HD   diclofenac Sodium  2 g Topical QID   famotidine  20 mg Oral Daily   gabapentin  100 mg Oral QHS   hydrALAZINE  50 mg Oral Q8H   insulin aspart  0-5 Units Subcutaneous QHS   insulin aspart  0-6 Units Subcutaneous TID WC   insulin glargine  12 Units Subcutaneous QHS   melatonin  3 mg Oral QHS   polyethylene glycol  17 g Oral Daily   rosuvastatin  10 mg Oral Daily   senna-docusate  1 tablet Oral BID   tiZANidine  2 mg Oral QHS    Dialysis Orders: New start, no OP orders - Accepted at [place] 2nd shift TTS  1st HD on 5/16.  [place] placed by [facility] IR on 5/16. R brachiobaslilic AVF placed on 0000000 by Dr. Donzetta Matters.  Goal EDW 55.5kg.   Assessment/Plan: Recent CVA with right sided spastic hemiparesis - now in CIR. ESRD - new start - continue with HD on TTS schedule.  Accepted to [facility] - see above.  Tolerated dialysis well on Thursday.  Plan for HD today per regular schedule.  K  3.5.  Anemia 2/2 ESRD. Last hgb 10.4. tsat 15% on 5/15 - IV iron repletion with 1g Fe completed. On high dose ESA, reduce dose. 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  No VDRA or binders.  Nutrition: renal diet, carb modified Hypertension: Blood pressure well controlled with amlodipine '10mg'$  qd and coreg 12.'5mg'$  BID.  Appears euvolemic on exam.  Minimal UF with HD.  EDW appears to be around 55.5kg.  Vascular access: First stage, RUE brachiobasilic AVF created [id number] by Dr. Donzetta Matters  Diabetes - per primary.   Jen Mow, PA-C [facility] 10/25/2020,12:01 PM  LOS: 7 days

## 2020-10-25 NOTE — Progress Notes (Signed)
nPhysical Therapy Session Note  Patient Details  Name: Hannah [name] MRN: [id number] Date of Birth: 05/28/1965  Today's Date: 10/25/2020 PT Individual Time: 0800-0900 PT Individual Time Calculation (min): 60 min   Short Term Goals: Week 1:  PT Short Term Goal 1 (Week 1): Pt will be able to perform bed mobility with supervision PT Short Term Goal 2 (Week 1): Pt will be able to perform transfers with CGA PT Short Term Goal 3 (Week 1): Pt will be able to gait x 50' with min assist PT Short Term Goal 4 (Week 1): Pt will be able to perform curb step negotiion with RW with mod assist Week 2:    Week 3:     Skilled Therapeutic Interventions/Progress Updates:    Pain:  Pt reports no pain.  Treatment to tolerance.  Rest breaks and repositioning as needed.  Pt initially supine and agreeable to treatment session w/focus on am ADLs, gait, dynamic balance. Supine to sit w/cga and use of rail.  Sit to stand to RW w/cga, additional time, cues to maximize upright posture.  Gait 43f to commode including doorway w/cga, cues for posture, flexor synergy RLE and unable to fully extend knee or DF at ankle.   Commode transfer w/cga including clothing management.  Independent w/perineal hygiene, washes perineal area w/set up and supervision for balance on commode. Gait 15 ft to bedside w/RW w/cga to min assist. Pt performs upper and lower body dressing w/supervision and additional timew/exception of min assist for tennis shoes. stand pivot transfer to wc w/RW, cga. Pt transported to gym Gait 1694fw/RW, cga to occasional min assist, flexor synergy RLE e/toe walking, some difficulty steering due to RUE decreased control but does not require assist. Inconsistent cadence.  Standing:  worked on R knee TKE/quad activation and flat floot in standing.  Progressed to split stance again working on full knee extension + DF at ankle.  Following rest, worked on achieving knee extension during stance and footflat at  midstance while gait training x 12525f/RW, cga, cues for above.  Able to demonstrate improved gait mechanics in these areas w/this gait trial.  Pt stand pivot transfer edge of bed to wc w/cga and Rw.  Pt left oob in wc w/alarm belt set and needs in reach   Therapy Documentation Precautions:  Precautions Precautions: Fall Precaution Comments: HD access port RUQ, AV graft maturing in Rt UE, spastic Rt hemiplegia at baseline Restrictions Weight Bearing Restrictions: No    Therapy/Group: Individual Therapy BarCallie FieldingT [place]11/2022, 12:29 PM

## 2020-10-26 DIAGNOSIS — N186 End stage renal disease: Secondary | ICD-10-CM | POA: Diagnosis not present

## 2020-10-26 DIAGNOSIS — R42 Dizziness and giddiness: Secondary | ICD-10-CM | POA: Diagnosis not present

## 2020-10-26 DIAGNOSIS — R5381 Other malaise: Secondary | ICD-10-CM | POA: Diagnosis not present

## 2020-10-26 DIAGNOSIS — I69398 Other sequelae of cerebral infarction: Secondary | ICD-10-CM | POA: Diagnosis not present

## 2020-10-26 LAB — GLUCOSE, CAPILLARY
Glucose-Capillary: 182 mg/dL — ABNORMAL HIGH (ref 70–99)
Glucose-Capillary: 275 mg/dL — ABNORMAL HIGH (ref 70–99)
Glucose-Capillary: 280 mg/dL — ABNORMAL HIGH (ref 70–99)
Glucose-Capillary: 194 mg/dL — ABNORMAL HIGH (ref 70–99)

## 2020-10-26 MED ORDER — INSULIN GLARGINE 100 UNIT/ML ~~LOC~~ SOLN
14.0000 [IU] | Freq: Every day | SUBCUTANEOUS | Status: DC
  Administered 2020-10-26 – 2020-10-29 (×4): 14 [IU] via SUBCUTANEOUS
  Filled 2020-10-26 (×5): qty 0.14

## 2020-10-26 MED ORDER — CARVEDILOL 25 MG PO TABS
25.0000 mg | ORAL_TABLET | Freq: Two times a day (BID) | ORAL | Status: DC
  Administered 2020-10-26 – 2020-11-03 (×13): 25 mg via ORAL
  Filled 2020-10-26 (×15): qty 1

## 2020-10-26 NOTE — Progress Notes (Signed)
Occupational Therapy Session Note  Patient Details  Name: Hannah [name] MRN: [id number] Date of Birth: [birth year]  Today's Date: 10/26/2020 OT Group Time: 1100-1200 OT Group Time Calculation (min): 60 min   Skilled Therapeutic Interventions/Progress Updates:    Pt engaged in therapeutic w/c level dance group focusing on patient choice, UE/LE strengthening, salience, activity tolerance, and social participation. Pt was guided through various dance-based exercises involving UEs/LEs and trunk. All music was selected by group members. Emphasis placed on tone mgt through AROM, activity tolerance, and standing endurance. Pt actively incorporating her Rt side during seated exercise throughout session, affect bright. Her daughter was also in attendance. Pt stood with OT and CGA while she moved her feet dynamically in beat to a favorite song. At end of session, pts daughter escorted her back to the room via w/c.    Therapy Documentation Precautions:  Precautions Precautions: Fall Precaution Comments: HD access port RUQ, AV graft maturing in Rt UE, spastic Rt hemiplegia at baseline Restrictions Weight Bearing Restrictions: No  Vital Signs: Therapy Vitals Pulse Rate: 94 BP: (!) 156/72 Pain: no s/s pain during tx   ADL: ADL Eating: Not assessed Grooming: Setup Where Assessed-Grooming: Edge of bed Upper Body Bathing: Supervision/safety Where Assessed-Upper Body Bathing: Edge of bed Lower Body Bathing: Contact guard Where Assessed-Lower Body Bathing: Edge of bed Upper Body Dressing: Supervision/safety Where Assessed-Upper Body Dressing: Edge of bed Lower Body Dressing: Minimal assistance Where Assessed-Lower Body Dressing: Edge of bed Toileting: Not assessed Toilet Transfer: Not assessed Tub/Shower Transfer: Not assessed     Therapy/Group: Group Therapy  Jamis Kryder A Johnell Landowski 10/26/2020, 12:42 PM

## 2020-10-26 NOTE — Progress Notes (Signed)
PROGRESS NOTE   Subjective/Complaints:  Pt reports doing well- HD went well- BG was OK last night.   ROS:   Pt denies SOB, abd pain, CP, N/V/C/D, and vision changes   Objective:   No results found. Recent Labs    10/23/20 1351 10/25/20 1330  WBC 6.7 6.0  HGB 10.4* 10.3*  HCT 34.6* 35.1*  PLT 226 257   Recent Labs    10/23/20 1351 10/25/20 1330  NA 135 132*  K 3.5 3.8  CL 100 97*  CO2 27 26  GLUCOSE 194* 258*  BUN 12 18  CREATININE 3.93* 5.39*  CALCIUM 8.7* 9.0    Intake/Output Summary (Last 24 hours) at 10/26/2020 1048 Last data filed at 10/26/2020 0800 Gross per 24 hour  Intake 320 ml  Output 1000 ml  Net -680 ml        Physical Exam: Vital Signs Blood pressure (!) 156/72, pulse 94, temperature 98.8 F (37.1 C), temperature source Oral, resp. rate 16, weight 53.4 kg, SpO2 99 %.        General: awake, alert, appropriate, sitting up in bed; NAD HENT: conjugate gaze; oropharynx moist CV: regular rate; no JVD Pulmonary: CTA B/L; no W/R/R- good air movement GI: soft, NT, ND, (+)BS Psychiatric: appropriate; interactive; bright affect Neurological: alert- MAS of 1+ to 2 in RUE esp in elbow- slightly better with increase Dantrolene. - no change today- about the same Skin: intact except R chest HD catheter- no change Neurological:     Right upper extremity with 4/5 hand grip- strength otherwise intact. RLE with 4/5 proximally and 3/5 distally. Left sided strength intact   Assessment/Plan: 1. Functional deficits which require 3+ hours per day of interdisciplinary therapy in a comprehensive inpatient rehab setting. Physiatrist is providing close team supervision and 24 hour management of active medical problems listed below. Physiatrist and rehab team continue to assess barriers to discharge/monitor patient progress toward functional and medical goals  Care Tool:  Bathing    Body parts bathed  by patient: Right arm, Left arm, Chest, Abdomen, Face, Front perineal area, Buttocks, Right upper leg, Left upper leg, Right lower leg, Left lower leg         Bathing assist Assist Level: Contact Guard/Touching assist     Upper Body Dressing/Undressing Upper body dressing   What is the patient wearing?: Pull over shirt, Bra    Upper body assist Assist Level: Supervision/Verbal cueing    Lower Body Dressing/Undressing Lower body dressing      What is the patient wearing?: Pants, Underwear/pull up     Lower body assist Assist for lower body dressing: Contact Guard/Touching assist     Toileting Toileting Toileting Activity did not occur (Clothing management and hygiene only): N/A (no void or bm)  Toileting assist Assist for toileting: Minimal Assistance - Patient > 75%     Transfers Chair/bed transfer  Transfers assist     Chair/bed transfer assist level: Contact Guard/Touching assist Chair/bed transfer assistive device: Programmer, multimedia   Ambulation assist      Assist level: Contact Guard/Touching assist Assistive device: Walker-rolling Max distance: 160   Walk 10 feet activity   Assist  Assist level: Contact Guard/Touching assist Assistive device: Walker-rolling   Walk 50 feet activity   Assist Walk 50 feet with 2 turns activity did not occur: Safety/medical concerns (fatigue)  Assist level: Contact Guard/Touching assist Assistive device: Walker-rolling    Walk 150 feet activity   Assist Walk 150 feet activity did not occur: Safety/medical concerns  Assist level: Contact Guard/Touching assist Assistive device: Walker-rolling    Walk 10 feet on uneven surface  activity   Assist Walk 10 feet on uneven surfaces activity did not occur: Safety/medical concerns         Wheelchair     Assist Will patient use wheelchair at discharge?: Yes Type of Wheelchair:  (uses power chair at home sometimes on bad days)     Wheelchair assist level: Dependent - Patient 0%      Wheelchair 50 feet with 2 turns activity    Assist        Assist Level: Dependent - Patient 0%   Wheelchair 150 feet activity     Assist      Assist Level: Dependent - Patient 0%   Blood pressure (!) 156/72, pulse 94, temperature 98.8 F (37.1 C), temperature source Oral, resp. rate 16, weight 53.4 kg, SpO2 99 %.   Medical Problem List and Plan: 1.  Cardiopulmonary debility             -patient may shower             -ELOS/Goals: 2-3 weeks MinA             con't PT and OT- pt wants to follow up with me- explained she will, but I'm not the PCP.  2.  Antithrombotics: -DVT/anticoagulation:  Mechanical: Sequential compression devices, below knee Bilateral lower extremities             -antiplatelet therapy: Plavix on hold until 6/9, discussed with patient.  6/9- restarted Plavix- pt also asked about it.  3. Pain Management: Continue Oxycodone prn.              -on Tizanidine 2 mg tid as per home regimen.  Muscle relaxer's contraindicated with HD--->changed to 2 mg HS per recommendations.   6/8- pain itself is controlled- spasticity as below  6/11- pain is controlled- con't regimen             --Continue low dose gabapentin.  4. Mood: LCSW to follow for evaluation and support.              -antipsychotic agents: N/A 5. Neuropsych: This patient is capable of making decisions on her own behalf. 6. Skin/Wound Care: Routine pressure relief measures.  7. Fluids/Electrolytes/Nutrition: Strict I/O.  8. ESRD: Now on HD MWF--schedule at the end of the day             --has been clipped for [facility]  6/7- pt is upset- feeling like they've taken too much fluid off- I'm not clear if they need to add midodrine, etc?  6/8- pt felt much better after HD this time- got 1L taken off- con't regimen per renal  6/10- having "hot" and itching feelings after HD- I know itching is normal- will check in the "hot" feeling-  no fever  6/12- went well- con't regimen 9. HTN: Monitor BP--continue Norvasc, Coreg bid, apresoline tid   6/8- BP slightly elevated today- will monitor for trend before changing meds   6/10- BP still a little elevated, but stable- might need to increase Coreg, however will  see what Nephrology says- don't want to drop her BP during HD. 6/12- BP 150s- per renal- con't regimen 10 Hyponatremia: SSRI d/c by nephrology with slow improved.  11. Nausea: Continue reglan bid-> wean to off?  6/8- will make reglan prn- BID prn- so can ask for if needs it.  12. Constipation: Managed with senna S bid and miralax daily.   6/7- LBM 2 days ago- normally goes 1-2x/week, so normal for her- if no BM by tomorrow, will intervene  6/8- LBM last night- con't regimen 13. Hypoglycemia with hx of DM: decrease Lantus HS to 9U.  6/6- BGs 167-196 in last 24 hours- will give it 24 hours and titrate Lantus as required  6/7- will increase Lantus to 10 units nightly- and monitor  6/8- BGs much better since increased Lantus last night- 107-185- vs in 200s the whole time- con't to monitor  6/9- Bgs 150s-mid 200s - might need Novolog with meals?- will d/w pt.   6/10- will try to increase Lantus to 12 units- will monitor  6/11- restart QHS SSI- con't regimen  6/12- Bgs overall in 100s- will increase Lantus to 14 units- and monitor 14. Spasticity  6/6- will start Dantrolene 25 mg  6/8- if still doing well tommorow will increase to 50 mg QHS  6/9- increased dantrolene to 50 mg QHS- will se ehow tolerates.   6/10- spasticity a little better- con't titration of Dantrolene. 6/11- will increase before d/c.   15. Hypotension after HD  6/6- might do better with midodrine or decreasing BP meds? Pt thinks it's due to taking too much fluid off.   6/7- will let HD team know with report today  6/8- Sx's resolved with taking off less fluid.    LOS: 8 days A FACE TO FACE EVALUATION WAS PERFORMED  Hannah [name] 10/26/2020, 10:48 AM

## 2020-10-26 NOTE — Progress Notes (Addendum)
Hannah [name] Progress Note   Subjective:   Patient seen and examined at bedside.  Tolerating dialysis well yesterday.  Asking about fluid removal at outpatient dialysis and whether she will be able to have only 1L removed each HD.  Discussed dry weights, fluid restrictions and how UF goals are determined.  Patient expressed understanding.  Denies CP, SOB, n/v/d, abdominal pain, weakness and fatigue. Excited to go home later this week and see her grandchildren.   Objective Vitals:   10/25/20 1642 10/25/20 2022 10/26/20 0459 10/26/20 0850  BP: 139/73 (!) 150/71 (!) 158/75 (!) 156/72  Pulse: 93 93 97 94  Resp: '16 17 16   '$ Temp: 98.1 F (36.7 C) 98.4 F (36.9 C) 98.8 F (37.1 C)   TempSrc: Oral Oral Oral   SpO2: 97% 98% 99%   Weight: 53.4 kg      Physical Exam General:well appearing female in NAD, laying in bed Heart:RRR, no mrg Lungs:CTAB, nml WOB on RA Abdomen:soft, NTND Extremities:no LE edema Dialysis Access: [facility], RU AVF mautring +b/t   Filed Weights   10/25/20 1305 10/25/20 1312 10/25/20 1642  Weight: 55.3 kg 55.3 kg 53.4 kg    Intake/Output Summary (Last 24 hours) at 10/26/2020 0913 Last data filed at 10/26/2020 0800 Gross per 24 hour  Intake 320 ml  Output 1000 ml  Net -680 ml    Additional Objective Labs: Basic Metabolic Panel: Recent Labs  Lab 10/22/20 0552 10/23/20 1351 10/25/20 1330  NA 133* 135 132*  K 3.9 3.5 3.8  CL 96* 100 97*  CO2 '29 27 26  '$ GLUCOSE 109* 194* 258*  BUN '10 12 18  '$ CREATININE 3.43* 3.93* 5.39*  CALCIUM 8.6* 8.7* 9.0  PHOS 3.8 3.2 4.4   Liver Function Tests: Recent Labs  Lab 10/22/20 0552 10/23/20 1351 10/25/20 1330  ALBUMIN 2.8* 3.1* 2.9*   CBC: Recent Labs  Lab 10/21/20 1443 10/23/20 1351 10/25/20 1330  WBC 7.2 6.7 6.0  HGB 9.6* 10.4* 10.3*  HCT 31.1* 34.6* 35.1*  MCV 96.9 98.3 100.0  PLT 267 226 257   CBG: Recent Labs  Lab 10/25/20 0558 10/25/20 1159 10/25/20 1720 10/25/20 2110 10/26/20 0617   GLUCAP 210* 232* 142* 147* 182*    Medications:   amLODipine  10 mg Oral Daily   carvedilol  12.5 mg Oral BID WC   Chlorhexidine Gluconate Cloth  6 each Topical Q0600   clopidogrel  75 mg Oral Daily   dantrolene  50 mg Oral QHS   darbepoetin (ARANESP) injection - DIALYSIS  200 mcg Intravenous Q Tue-HD   diclofenac Sodium  2 g Topical QID   famotidine  20 mg Oral Daily   gabapentin  100 mg Oral QHS   hydrALAZINE  50 mg Oral Q8H   insulin aspart  0-5 Units Subcutaneous QHS   insulin aspart  0-6 Units Subcutaneous TID WC   insulin glargine  12 Units Subcutaneous QHS   melatonin  3 mg Oral QHS   polyethylene glycol  17 g Oral Daily   rosuvastatin  10 mg Oral Daily   senna-docusate  1 tablet Oral BID   tiZANidine  2 mg Oral QHS    Dialysis Orders: New start, no OP orders - Accepted at [place] 2nd shift TTS 1st HD on 5/16. [place] placed by [facility] IR on 5/16. R brachiobaslilic AVF placed on 0000000 by Dr. Donzetta Matters. Goal EDW 55.5kg.    Assessment/Plan: Recent CVA with right sided spastic hemiparesis - now in CIR. ESRD -  new start - continue with HD on TTS schedule.  Accepted to [facility] - see above.  Tolerated dialysis well yesterday.  Next HD on 10/28/20.  K 3.8.  Anemia 2/2 ESRD. Last hgb 10.3. tsat 15% on 5/15 - IV iron repletion with 1g Fe completed. On high dose ESA, reduce dose with Hgb>10. 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  No VDRA or binders. Nutrition: renal diet, carb modified. Alb 2.9 - protein supplements. Hypertension: Blood pressure elevated today, continue amlodipine '10mg'$  qd, hydralazine '50mg'$  TID and increase coreg '25mg'$  BID.  Hold meds prior to HD. Appears euvolemic on exam.  Minimal UF with HD.  EDW appears to be around 55.5kg. Vascular access: First stage, RUE brachiobasilic AVF created [id number] by Dr. Donzetta Matters  Diabetes - per primary.   Jen Mow, PA-C [name] 10/26/2020,9:13 AM  LOS: 8 days

## 2020-10-26 NOTE — Progress Notes (Signed)
Physical Therapy Session Note  Patient Details  Name: Hannah [name] MRN: [id number] Date of Birth: 03-29-1966  Today's Date: 10/26/2020 PT Individual Time: 1030-1100 PT Individual Time Calculation (min): 30 min   Short Term Goals: Week 1:  PT Short Term Goal 1 (Week 1): Pt will be able to perform bed mobility with supervision PT Short Term Goal 2 (Week 1): Pt will be able to perform transfers with CGA PT Short Term Goal 3 (Week 1): Pt will be able to gait x 50' with min assist PT Short Term Goal 4 (Week 1): Pt will be able to perform curb step negotiion with RW with mod assist  Skilled Therapeutic Interventions/Progress Updates:   Pt seated in w/c.  Dtr present.  Initially pt refused to come to tx if her dtr could not come with her, but with urging agreed to comply.  neuromuscular re-education via forced use and multimodal cues for : use of Kinetron at level 30 cm/sec x 2 min targeting quads, x 2 min targeting gluteal muscles.  Sit>< stand with close supervision.   Gait training through obstacle course requring R/L side stepping with RW, with CGA and cues for sequencing and placing R foot flat on floor. Gait on level tile with 2 turns, x 90' with CGA, to fatigue.  At end of session, pt seated in wc awaiting Dance group to start; AJ, RT present.    Therapy Documentation Precautions:  Precautions Precautions: Fall Precaution Comments: HD access port RUQ, AV graft maturing in Rt UE, spastic Rt hemiplegia at baseline Restrictions Weight Bearing Restrictions: No      Therapy/Group: Individual Therapy  [name] 10/26/2020, 12:27 PM

## 2020-10-27 DIAGNOSIS — R5381 Other malaise: Secondary | ICD-10-CM | POA: Diagnosis not present

## 2020-10-27 LAB — GLUCOSE, CAPILLARY
Glucose-Capillary: 197 mg/dL — ABNORMAL HIGH (ref 70–99)
Glucose-Capillary: 263 mg/dL — ABNORMAL HIGH (ref 70–99)
Glucose-Capillary: 410 mg/dL — ABNORMAL HIGH (ref 70–99)
Glucose-Capillary: 210 mg/dL — ABNORMAL HIGH (ref 70–99)

## 2020-10-27 NOTE — Progress Notes (Signed)
PROGRESS NOTE   Subjective/Complaints:   Pt states she had a brace after her stroke in 2009 and felt it was helpful but lost the brace  Wanting to go home but is scared of falling  ROS:   Pt denies SOB, abd pain, CP, N/V/C/D, and vision changes   Objective:   No results found. Recent Labs    10/25/20 1330  WBC 6.0  HGB 10.3*  HCT 35.1*  PLT 257    Recent Labs    10/25/20 1330  NA 132*  K 3.8  CL 97*  CO2 26  GLUCOSE 258*  BUN 18  CREATININE 5.39*  CALCIUM 9.0     Intake/Output Summary (Last 24 hours) at 10/27/2020 1148 Last data filed at 10/27/2020 0816 Gross per 24 hour  Intake 720 ml  Output --  Net 720 ml         Physical Exam: Vital Signs Blood pressure 132/69, pulse 91, temperature 98 F (36.7 C), resp. rate 18, weight 54 kg, SpO2 96 %.   General: No acute distress Mood and affect are appropriate Heart: Regular rate and rhythm no rubs murmurs or extra sounds Lungs: Clear to auscultation, breathing unlabored, no rales or wheezes Abdomen: Positive bowel sounds, soft nontender to palpation, nondistended Extremities: No clubbing, cyanosis, or edema Skin: No evidence of breakdown, no evidence of rash   Neurological: alert- MAS of 1+ to 2 in RUE esp in elbow- slightly better with increase Dantrolene. - no change today- about the same Skin: intact except R chest HD catheter- no change Neurological:     Right upper extremity with 4/5 hand grip- strength otherwise intact. RLE with 4/5 proximally and 3-/5 at ankle DF/PFLeft sided strength intact   Assessment/Plan: 1. Functional deficits which require 3+ hours per day of interdisciplinary therapy in a comprehensive inpatient rehab setting. Physiatrist is providing close team supervision and 24 hour management of active medical problems listed below. Physiatrist and rehab team continue to assess barriers to discharge/monitor patient progress  toward functional and medical goals  Care Tool:  Bathing    Body parts bathed by patient: Right arm, Left arm, Chest, Abdomen, Face, Front perineal area, Buttocks, Right upper leg, Left upper leg, Right lower leg, Left lower leg         Bathing assist Assist Level: Contact Guard/Touching assist     Upper Body Dressing/Undressing Upper body dressing   What is the patient wearing?: Pull over shirt    Upper body assist Assist Level: Supervision/Verbal cueing    Lower Body Dressing/Undressing Lower body dressing      What is the patient wearing?: Pants, Underwear/pull up     Lower body assist Assist for lower body dressing: Supervision/Verbal cueing     Toileting Toileting Toileting Activity did not occur (Clothing management and hygiene only): N/A (no void or bm)  Toileting assist Assist for toileting: Supervision/Verbal cueing     Transfers Chair/bed transfer  Transfers assist     Chair/bed transfer assist level: Contact Guard/Touching assist Chair/bed transfer assistive device: Programmer, multimedia   Ambulation assist      Assist level: Contact Guard/Touching assist Assistive device: Walker-rolling Max distance: 90  Walk 10 feet activity   Assist     Assist level: Contact Guard/Touching assist Assistive device: Walker-rolling   Walk 50 feet activity   Assist Walk 50 feet with 2 turns activity did not occur: Safety/medical concerns (fatigue)  Assist level: Contact Guard/Touching assist Assistive device: Walker-rolling    Walk 150 feet activity   Assist Walk 150 feet activity did not occur: Safety/medical concerns  Assist level: Contact Guard/Touching assist Assistive device: Walker-rolling    Walk 10 feet on uneven surface  activity   Assist Walk 10 feet on uneven surfaces activity did not occur: Safety/medical concerns         Wheelchair     Assist Will patient use wheelchair at discharge?: Yes Type of  Wheelchair:  (uses power chair at home sometimes on bad days)    Wheelchair assist level: Dependent - Patient 0%      Wheelchair 50 feet with 2 turns activity    Assist        Assist Level: Dependent - Patient 0%   Wheelchair 150 feet activity     Assist      Assist Level: Dependent - Patient 0%   Blood pressure 132/69, pulse 91, temperature 98 F (36.7 C), resp. rate 18, weight 54 kg, SpO2 96 %.   Medical Problem List and Plan: 1.  Cardiopulmonary debility- also with hx of CVA and right spastic hemi with footdrop May benefit from AFO eval prior to discharge             -patient may shower             -ELOS/Goals: 2-3 weeks MinA             con't PT and OT- pt wants to follow up with me- explained she will, but I'm not the PCP.  2.  Antithrombotics: -DVT/anticoagulation:  Mechanical: Sequential compression devices, below knee Bilateral lower extremities             -antiplatelet therapy: Plavix on hold until 6/9, discussed with patient.  6/9- restarted Plavix- pt also asked about it.  3. Pain Management: Continue Oxycodone prn.              -on Tizanidine 2 mg tid as per home regimen.  Muscle relaxer's contraindicated with HD--->changed to 2 mg HS per recommendations.   6/8- pain itself is controlled- spasticity as below  6/11- pain is controlled- con't regimen             --Continue low dose gabapentin.  4. Mood: LCSW to follow for evaluation and support.              -antipsychotic agents: N/A 5. Neuropsych: This patient is capable of making decisions on her own behalf. 6. Skin/Wound Care: Routine pressure relief measures.  7. Fluids/Electrolytes/Nutrition: Strict I/O.  8. ESRD: Now on HD MWF--schedule at the end of the day             --has been clipped for [facility]  6/7- pt is upset- feeling like they've taken too much fluid off- I'm not clear if they need to add midodrine, etc?  6/8- pt felt much better after HD this time- got 1L taken off-  con't regimen per renal  6/10- having "hot" and itching feelings after HD- I know itching is normal- will check in the "hot" feeling- no fever  6/12- went well- con't regimen 9. HTN: Monitor BP--continue Norvasc, Coreg bid, apresoline tid   6/8- BP slightly  elevated today- will monitor for trend before changing meds   6/10- BP still a little elevated, but stable- might need to increase Coreg, however will see what Nephrology says- don't want to drop her BP during HD. 6/12- BP 150s- per renal- con't regimen 10 Hyponatremia: SSRI d/c by nephrology with slow improved.  11. Nausea: Continue reglan bid-> wean to off?  6/8- will make reglan prn- BID prn- so can ask for if needs it.  12. Constipation: Managed with senna S bid and miralax daily.   6/7- LBM 2 days ago- normally goes 1-2x/week, so normal for her- if no BM by tomorrow, will intervene  6/8- LBM last night- con't regimen 13. Hypoglycemia with hx of DM: decrease Lantus HS to 9U.  6/6- BGs 167-196 in last 24 hours- will give it 24 hours and titrate Lantus as required  6/7- will increase Lantus to 10 units nightly- and monitor  6/8- BGs much better since increased Lantus last night- 107-185- vs in 200s the whole time- con't to monitor  6/9- Bgs 150s-mid 200s - might need Novolog with meals?- will d/w pt.   6/10- will try to increase Lantus to 12 units- will monitor  6/11- restart QHS SSI- con't regimen  6/12- Bgs overall in 100s- will increase Lantus to 14 units- and monitor 14. Spasticity  6/6- will start Dantrolene 25 mg  6/8- if still doing well tommorow will increase to 50 mg QHS  6/9- increased dantrolene to 50 mg QHS- will se ehow tolerates.   6/10- spasticity a little better- con't titration of Dantrolene. 6/11- will increase before d/c.   15. Hypotension after HD  6/6- might do better with midodrine or decreasing BP meds? Pt thinks it's due to taking too much fluid off.   6/7- will let HD team know with report today  6/8- Sx's  resolved with taking off less fluid.    LOS: 9 days A FACE TO FACE EVALUATION WAS PERFORMED  Charlett Blake 10/27/2020, 11:48 AM

## 2020-10-27 NOTE — Progress Notes (Signed)
Occupational Therapy Session Note  Patient Details  Name: Hannah [name] MRN: [id number] Date of Birth: 30-Jan-1966  Today's Date: 10/27/2020 OT Individual Time: 9407-6808 OT Individual Time Calculation (min): 69 min    Short Term Goals: Week 1:  OT Short Term Goal 1 (Week 1): Pt will complete LB dressing with CGA at sit<stand level using LRAD as needed OT Short Term Goal 1 - Progress (Week 1): Met OT Short Term Goal 2 (Week 1): Pt will complete an ambulatory toilet transfer with no more than Min A while using LRAD OT Short Term Goal 2 - Progress (Week 1): Met OT Short Term Goal 3 (Week 1): Pt will complete an ambulatory shower transfer with no more than Min A while using LRAD OT Short Term Goal 3 - Progress (Week 1): Progressing toward goal Week 2:  OT Short Term Goal 1 (Week 2): STGs = LTGs d/t ELOS  Skilled Therapeutic Interventions/Progress Updates:    Pt greeted at time of session sitting up in wheelchair agreeable to OT session with daughter present who reamined throughout session. Pt with no pain throughout. Extensive discussion with pt at beginning of session regarding DC date, wanting to leave Thursday instead of Friday, relayed Nephrology concerns but going to have team discuss at conference tomorrow, pt and daughter agreeable to this. Discussed and simulated ADL sit <> stand level at sink with unilateral support, daughter aware of Supervision needed and feels comfortable to assist. Ambulatory transfer to/from bathroom close supervision. Ambulated approx 50 feet toward gym, wheelchair transport remaining distance to gym and all stand pivot/ambulatory transfers with CGA/close supervision. Nustep for 7 mins w/ set up and take down on level 6 for global endurance. Seated 2x20 ball hits w/ lightweight dowel for reaction speed and core strengthening, 1x10 in standing daughter providing CGA and 1 mild LOB. Seated BUE stretches with large therapy ball 1x5 with static hold at end range for passive  stretching and core strengthening. Wheelchair transport back to room, alarm on call bell in reach.    Therapy Documentation Precautions:  Precautions Precautions: Fall Precaution Comments: HD access port RUQ, AV graft maturing in Rt UE, spastic Rt hemiplegia at baseline Restrictions Weight Bearing Restrictions: No     Therapy/Group: Individual Therapy  Viona Gilmore 10/27/2020, 4:31 PM

## 2020-10-27 NOTE — Progress Notes (Signed)
Patient ID: Hannah [name], female   DOB: [birth year], 55 y.o.   MRN: [id number]  Sw dropped HH options to patient. Pt reports she does not anyone coming to the home and prefers OP. SW will wait for recommendations  [name], [place]

## 2020-10-27 NOTE — Progress Notes (Signed)
Physical Therapy Weekly Progress Note  Patient Details  Name: Hannah [name] MRN: [id number] Date of Birth: October 29, 1965  Beginning of progress report period: October 19, 2020 End of progress report period: October 27, 2020  Today's Date: 10/27/2020 PT Individual Time: 1400-1500 PT Individual Time Calculation (min): 60 min   Patient has met 1 of 3 short term goals.  Pt continues to require min A for bed mobility intermittently pending fatigue. Pt also would benefit from additional training with curb step negotiation.   Patient continues to demonstrate the following deficits muscle weakness, decreased cardiorespiratoy endurance, abnormal tone and decreased coordination, and decreased standing balance, decreased postural control, and decreased balance strategies and therefore will continue to benefit from skilled PT intervention to increase functional independence with mobility.  Patient progressing toward long term goals..  Continue plan of care.  PT Short Term Goals Week 1:  PT Short Term Goal 1 (Week 1): Pt will be able to perform bed mobility with supervision PT Short Term Goal 1 - Progress (Week 1): Not met PT Short Term Goal 2 (Week 1): Pt will be able to perform transfers with CGA PT Short Term Goal 2 - Progress (Week 1): Met PT Short Term Goal 3 (Week 1): Pt will be able to gait x 50' with min assist PT Short Term Goal 3 - Progress (Week 1): Met PT Short Term Goal 4 (Week 1): Pt will be able to perform curb step negotiion with RW with mod assist PT Short Term Goal 4 - Progress (Week 1): Not met Week 2:  PT Short Term Goal 1 (Week 2): Pt will be able to perform curb step negotiion with RW with mod assist PT Short Term Goal 2 (Week 2): pt to demonstrate bed mobility CGA PT Short Term Goal 3 (Week 2): pt to demonstrate functional transfers wiht LRAD CGA consistently   Skilled Therapeutic Interventions/Progress Updates:    pt received in [facility] and agreeable to therapy. Pts daughter present for  family education, educated on pt's current level of assist with all functional mobility. Pt denied pain throughout. Pt taken to gymtotal A for time in WC. Pt directed in gait 10' to car for simulated car transfers at estimated height of DC home, supervision to complete, pt directed in ascending/descending  ramp with Rolling walker CGA for decreased step clearance on RLE and need of VC for technique. Pt then required rest break, pt and daughter educated on energy conservation at home during the day and planning day around appointments for improved tolerance to activity, both agreed. Pt directed in gait training with Rolling walker 115' CGA with VC for increased step height on RLE and trunk extension. Pt directed in seated BLE strengthening exercises with 5# 2x10 marching and LAQ. Pt then directed in standing calf and hamstring stretch 3x1 min with BUE support at //, pt reported this felt "really good". Pt required seated rest break after this. Pt provided music for remainder of session which pt reported she enjoyed and improved her mood greatly. Pt directed in seated core strengthening with forward/backward slight rotations and shoulder elevation, hip flexion and toe tapping set to beat of music for improved tolerance to activity, increased cadence of movement with good effect. Pt also directed in standing small toe taps set to music for improved balance and standing tolerance. Pt returned to room in [facility], total A for time and energy. Pt left in WC, All needs in reach and in good condition. Call light in hand.  And daughter  present.   Therapy Documentation Precautions:  Precautions Precautions: Fall Precaution Comments: HD access port RUQ, AV graft maturing in Rt UE, spastic Rt hemiplegia at baseline Restrictions Weight Bearing Restrictions: No General:   Vital Signs: Therapy Vitals Temp: 99.8 F (37.7 C) Temp Source: Oral Pulse Rate: 95 Resp: 17 BP: 123/65 Patient Position (if appropriate):  Sitting Oxygen Therapy SpO2: 100 % O2 Device: Room Air Pain:   Vision/Perception     Mobility:   Locomotion :    Trunk/Postural Assessment :    Balance:   Exercises:   Other Treatments:     Therapy/Group: Individual Therapy  Junie Panning 10/27/2020, 3:44 PM

## 2020-10-27 NOTE — Progress Notes (Signed)
Occupational Therapy Weekly Progress Note  Patient Details  Name: Hannah [name] MRN: [id number] Date of Birth: Oct 26, 1965  Beginning of progress report period: 10/19/2020 End of progress report period: 10/27/2020   Patient has met 2 of 3 short term goals.    Pt has made excellent progress at time of report. She now completes ambulatory toilet transfers at [facility] level using RW. Close supervision-CGA for ADLs overall. Her motivation to participate continues to be exceptionally high as well as her therapeutic effort during OT. Her daughters have been present during multiple sessions to observe with plans to initiate hands on family education this week. Continue OT POC at this time.   Patient continues to demonstrate the following deficits: muscle weakness and muscle joint tightness, decreased cardiorespiratoy endurance, abnormal tone and decreased coordination, and decreased standing balance, decreased postural control, and hemiplegia and therefore will continue to benefit from skilled OT intervention to enhance overall performance with BADL.  Patient progressing toward long term goals..  Continue plan of care.  OT Short Term Goals Week 1:  OT Short Term Goal 1 (Week 1): Pt will complete LB dressing with CGA at sit<stand level using LRAD as needed OT Short Term Goal 1 - Progress (Week 1): Met OT Short Term Goal 2 (Week 1): Pt will complete an ambulatory toilet transfer with no more than Min A while using LRAD OT Short Term Goal 2 - Progress (Week 1): Met OT Short Term Goal 3 (Week 1): Pt will complete an ambulatory shower transfer with no more than Min A while using LRAD OT Short Term Goal 3 - Progress (Week 1): Progressing toward goal Week 2:  OT Short Term Goal 1 (Week 2): STGs = LTGs d/t ELOS    Therapy Documentation Precautions:  Precautions Precautions: Fall Precaution Comments: HD access port RUQ, AV graft maturing in Rt UE, spastic Rt hemiplegia at baseline Restrictions Weight  Bearing Restrictions: No      Therapy/Group: Individual Therapy  Michaela A Hoffman 10/27/2020, 7:04 AM

## 2020-10-27 NOTE — Progress Notes (Signed)
Occupational Therapy Session Note  Patient Details  Name: Hannah [name] MRN: [id number] Date of Birth: Jan 06, 1966  Today's Date: 10/27/2020 OT Individual Time: [id number] Individual Time Calculation (min): 53 min    Short Term Goals: Week 1:  OT Short Term Goal 1 (Week 1): Pt will complete LB dressing with CGA at sit<stand level using LRAD as needed OT Short Term Goal 2 (Week 1): Pt will complete an ambulatory toilet transfer with no more than Min A while using LRAD OT Short Term Goal 3 (Week 1): Pt will complete an ambulatory shower transfer with no more than Min A while using LRAD  Skilled Therapeutic Interventions/Progress Updates:    Pt greeted in bed with no c/o pain. Requesting to use the restroom. Bed mobility completed unassisted with HOB elevated, using the bedrail. CGA for ambulatory transfer to toilet and close supervision for 3/3 components of toileting with pt having +bladder void. She then transferred to the w/c placed in front of sink to wash face and complete a few other grooming tasks. Pt opting to sit vs stand at this time to conserve energy. Dressing was then completed at sit<stand level using RW with supervision/cues and increased time. We made plans for pt to call daughter to come in during PM therapy sessions for family education. Pt remained sitting up in the w/c at end of session, all needs within reach.   Pt provided with lavender aromatherapy to promote relaxation due to some feelings of stress from homesickness  Therapy Documentation Precautions:  Precautions Precautions: Fall Precaution Comments: HD access port RUQ, AV graft maturing in Rt UE, spastic Rt hemiplegia at baseline Restrictions Weight Bearing Restrictions: No   Pain: Pain Assessment Pain Scale: 0-10 Pain Score: 0-No pain ADL: ADL Eating: Not assessed Grooming: Setup Where Assessed-Grooming: Edge of bed Upper Body Bathing: Supervision/safety Where Assessed-Upper Body Bathing: Edge of  bed Lower Body Bathing: Contact guard Where Assessed-Lower Body Bathing: Edge of bed Upper Body Dressing: Supervision/safety Where Assessed-Upper Body Dressing: Edge of bed Lower Body Dressing: Minimal assistance Where Assessed-Lower Body Dressing: Edge of bed Toileting: Not assessed Toilet Transfer: Not assessed Tub/Shower Transfer: Not assessed      Therapy/Group: Individual Therapy  Danyell Shader A Chanise Habeck 10/27/2020, 12:21 PM

## 2020-10-28 DIAGNOSIS — I69398 Other sequelae of cerebral infarction: Secondary | ICD-10-CM | POA: Diagnosis not present

## 2020-10-28 DIAGNOSIS — R5381 Other malaise: Secondary | ICD-10-CM | POA: Diagnosis not present

## 2020-10-28 DIAGNOSIS — N186 End stage renal disease: Secondary | ICD-10-CM | POA: Diagnosis not present

## 2020-10-28 DIAGNOSIS — R42 Dizziness and giddiness: Secondary | ICD-10-CM | POA: Diagnosis not present

## 2020-10-28 LAB — RENAL FUNCTION PANEL
Albumin: 3 g/dL — ABNORMAL LOW (ref 3.5–5.0)
Anion gap: 8 (ref 5–15)
BUN: 18 mg/dL (ref 6–20)
CO2: 27 mmol/L (ref 22–32)
Calcium: 8.8 mg/dL — ABNORMAL LOW (ref 8.9–10.3)
Chloride: 100 mmol/L (ref 98–111)
Creatinine, Ser: 4.34 mg/dL — ABNORMAL HIGH (ref 0.44–1.00)
GFR, Estimated: 11 mL/min — ABNORMAL LOW (ref >60)
Glucose, Bld: 191 mg/dL — ABNORMAL HIGH (ref 70–99)
Phosphorus: 3.6 mg/dL (ref 2.5–4.6)
Potassium: 3.6 mmol/L (ref 3.5–5.1)
Sodium: 135 mmol/L (ref 135–145)

## 2020-10-28 LAB — CBC
HCT: 35.3 % — ABNORMAL LOW (ref 36.0–46.0)
Hemoglobin: 10.8 g/dL — ABNORMAL LOW (ref 12.0–15.0)
MCH: 29.8 pg (ref 26.0–34.0)
MCHC: 30.6 g/dL (ref 30.0–36.0)
MCV: 97.5 fL (ref 80.0–100.0)
Platelets: 206 10*3/uL (ref 150–400)
RBC: 3.62 MIL/uL — ABNORMAL LOW (ref 3.87–5.11)
RDW: 17.5 % — ABNORMAL HIGH (ref 11.5–15.5)
WBC: 4.4 10*3/uL (ref 4.0–10.5)
nRBC: 0 % (ref 0.0–0.2)

## 2020-10-28 LAB — HEPATITIS B CORE ANTIBODY, TOTAL: Hep B Core Total Ab: NONREACTIVE

## 2020-10-28 LAB — GLUCOSE, CAPILLARY
Glucose-Capillary: 106 mg/dL — ABNORMAL HIGH (ref 70–99)
Glucose-Capillary: 234 mg/dL — ABNORMAL HIGH (ref 70–99)
Glucose-Capillary: 182 mg/dL — ABNORMAL HIGH (ref 70–99)

## 2020-10-28 LAB — HEPATITIS B SURFACE ANTIGEN: Hepatitis B Surface Ag: NONREACTIVE

## 2020-10-28 MED ORDER — DARBEPOETIN ALFA 200 MCG/0.4ML IJ SOSY
PREFILLED_SYRINGE | INTRAMUSCULAR | Status: AC
  Filled 2020-10-28: qty 0.4

## 2020-10-28 MED ORDER — DANTROLENE SODIUM 25 MG PO CAPS
50.0000 mg | ORAL_CAPSULE | Freq: Two times a day (BID) | ORAL | Status: DC
  Administered 2020-10-28 – 2020-11-03 (×12): 50 mg via ORAL
  Filled 2020-10-28 (×13): qty 2

## 2020-10-28 MED ORDER — DIPHENHYDRAMINE HCL 25 MG PO CAPS
ORAL_CAPSULE | ORAL | Status: AC
  Filled 2020-10-28: qty 1

## 2020-10-28 NOTE — Progress Notes (Signed)
Orthopedic Tech Progress Note Patient Details:  Hannah [name] 13-Oct-1965 [id number]  Called in STAT order to [employer] for an AFO CONSULT   Patient ID: Hannah [name], female   DOB: 1965/11/21, 54 y.o.   MRN: [id number]  Hannah [name] 10/28/2020, 8:59 AM

## 2020-10-28 NOTE — Progress Notes (Signed)
Physical Therapy Session Note  Patient Details  Name: Hannah [name] MRN: [id number] Date of Birth: 05-Apr-1966  Today's Date: 10/28/2020 PT Individual Time: 1135-1200 PT Individual Time Calculation (min): 25 min   Short Term Goals: Week 1:  PT Short Term Goal 1 (Week 1): Pt will be able to perform bed mobility with supervision PT Short Term Goal 1 - Progress (Week 1): Not met PT Short Term Goal 2 (Week 1): Pt will be able to perform transfers with CGA PT Short Term Goal 2 - Progress (Week 1): Met PT Short Term Goal 3 (Week 1): Pt will be able to gait x 50' with min assist PT Short Term Goal 3 - Progress (Week 1): Met PT Short Term Goal 4 (Week 1): Pt will be able to perform curb step negotiion with RW with mod assist PT Short Term Goal 4 - Progress (Week 1): Not met Week 2:  PT Short Term Goal 1 (Week 2): Pt will be able to perform curb step negotiion with RW with mod assist PT Short Term Goal 2 (Week 2): pt to demonstrate bed mobility CGA PT Short Term Goal 3 (Week 2): pt to demonstrate functional transfers wiht LRAD CGA consistently Week 3:     Skilled Therapeutic Interventions/Progress Updates:    Pain:  Pt reports no pain.  Treatment to tolerance.  Rest breaks and repositioning as needed.  Pt initially seated in wc and agreeable to treatment session w/focus on gait assessment w/AFO delivered by [employer] this am.  NT in room and reports elevated BS, agreed to bedside rx to await insulin.   Sit to stand w/cga and gait x 117f including 3 turns w/RW, pt progresses RLE in flexor synergistic pattern but clears consistently w/AFO.  Narrowing base of support w/fatigue. In standing, worked on achieving increase step length and full extension of R knee w/stepping forward/back w/RLE, footflat and full knee extension emphasized w/stepping back. Pt received insulin shot in sitting then repeated gait w/cues to increase step lenth, knee extension at initial contact, to achieve full kneeextension at  midstance w/improved gait mechanics, however, constant cueing and attention to task to maintain mechanics.  Consistently clears limb using AFO.  Would recommend removing heel wedge and assessing mechanics without as this likely inhibits terminal knee extension particularly at midstance. Pt left oob in wc w/alarm belt set and needs in reach     Therapy Documentation Precautions:  Precautions Precautions: Fall Precaution Comments: HD access port RUQ, AV graft maturing in Rt UE, spastic Rt hemiplegia at baseline Restrictions Weight Bearing Restrictions: No    Therapy/Group: Individual Therapy BCallie Fielding PTamaqua6/14/2022, 12:22 PM

## 2020-10-28 NOTE — Patient Care Conference (Signed)
Inpatient RehabilitationTeam Conference and Plan of Care Update Date: 10/28/2020   Time: 11:35 AM    Patient Name: Hannah [name]      Medical Record Number: [id number]  Date of Birth: 18-Jul-1965 Sex: Female         Room/Bed: 4W05C/4W05C-01 Payor Info: Payor: [employer] MEDICARE / Plan: [facility] MEDICARE / Product Type: *No Product type* /    Admit Date/Time:  10/18/2020  5:50 PM  Primary Diagnosis:  [facility] [facility] Problems: Principal Problem:   Debility    Expected Discharge Date: Expected Discharge Date: 11/03/20  Team Members Present: Physician leading conference: Dr. Courtney Heys Care Coodinator Present: [name], BSW;Melody Cirrincione Creig Hines, RN, BSN, [place] Nurse Present: Dorthula Nettles, RN PT Present: Excell Seltzer, PT OT Present: Lillia Corporal, OT PPS Coordinator present : Ileana Ladd, PT     Current Status/Progress Goal Weekly Team Focus  Bowel/Bladder   Patient is continent of bowel/bladder  Patient will remain continent of bowel/bladder  Will assess qshift and PRN   Swallow/Nutrition/ Hydration             ADL's   CGA bathing w/c level at the sink sit<stand, supervision UB/LB dressing, CGA toilet transfer using RW at ambulatory level  CGA-Supervision  family education, d/c planning   Mobility   CGA bed mob, transfers with RW supervision-CGA; gait 150+' RW CGA; new AFO which improved, standing balance supervision; decreased endurance  CGA grossly, 75' RW for gait, 1 curb step CGA  balance, transfers, gait, WC mobility, increase tolerance to activity   Communication             Safety/Cognition/ Behavioral Observations            Pain   Patient reports no pain at this time  Patient will remain pain free  Will assess qshift and PRN   Skin   Patient's fistula incision is healing  Patient's fistula incision will continue to heal  Will assess qshift and PRN     Discharge Planning:  Discharging home with daughter to provide care   Team  Discussion: Increasing Dantrolene, blood sugars are all over the place. Continent of B/B, has a port on the right chest, fistula on the right arm.  Patient on target to meet rehab goals: yes, almost at goal level. Supervision for dressing, contact guard walking to the toilet. Contact guard bed mobility, transfers. PT can still work on balance.  *See Care Plan and progress notes for long and short-term goals.   Revisions to Treatment Plan:  Getting new AFO, increased Dantrolene.  Teaching Needs: Family education, medication management, skin/wound care, transfer training, gait training, balance training, endurance training, safety awareness, diabetic education, HD education.  Current Barriers to Discharge: Decreased caregiver support, Medical stability, Home enviroment access/layout, Wound care, Lack of/limited family support, Hemodialysis, Medication compliance, and Behavior  Possible Resolutions to Barriers: Continue current medications, provide emotional support.     Medical Summary Current Status: BG's verty labile; spasticity on R side; continent B/B; the more I increased Lantus, the higher her BG's go.  Barriers to Discharge: Decreased family/caregiver support;Home enviroment access/layout;Medical stability;New diabetic;Weight bearing restrictions  Barriers to Discharge Comments: daughter moving to new apartment Friday- daughter family training done. Possible Resolutions to [employer]: focus- DM coordinators order; will get new AFO for R hemi- trialed ; increased Dantrolene; d/c will move 6/20- d/c - will move.   Continued Need for Acute Rehabilitation Level of Care: The patient requires daily medical management by a physician with specialized  training in physical medicine and rehabilitation for the following reasons: Direction of a multidisciplinary physical rehabilitation program to maximize functional independence : Yes Medical management of patient stability for  increased activity during participation in an intensive rehabilitation regime.: Yes Analysis of laboratory values and/or radiology reports with any subsequent need for medication adjustment and/or medical intervention. : Yes   I attest that I was present, lead the team conference, and concur with the assessment and plan of the team.   Cristi Loron 10/28/2020, 2:48 PM

## 2020-10-28 NOTE — Progress Notes (Addendum)
[facility] Progress Note   Subjective: Seen on HD, no C/Os. Attempting to limit UF-we discussed this at length. No C/Os.   Objective Vitals:   10/27/20 2034 10/28/20 0455 10/28/20 1241 10/28/20 1404  BP: 113/68 139/73 138/63 (!) 147/71  Pulse: 97 88 95 97  Resp: '16 18 16 18  '$ Temp: 98.1 F (36.7 C) 98.3 F (36.8 C) 97.6 F (36.4 C) 99.8 F (37.7 C)  TempSrc: Oral Oral  Oral  SpO2: 99% 97% 100% 99%  Weight:  59.2 kg     Physical Exam General: Pleasant, well appearing female in NAD Heart: S1,S2. Radiation from AVF heard across precordium. No M/R/G Lungs: CTAB A/P Abdomen Active BS NT Extremities: No LE edema Dialysis Access: L AVF + T/B. Maturing well. RIJ TDC Drsg CDI.     Additional Objective Labs: Basic Metabolic Panel: Recent Labs  Lab 10/22/20 0552 10/23/20 1351 10/25/20 1330  NA 133* 135 132*  K 3.9 3.5 3.8  CL 96* 100 97*  CO2 '29 27 26  '$ GLUCOSE 109* 194* 258*  BUN '10 12 18  '$ CREATININE 3.43* 3.93* 5.39*  CALCIUM 8.6* 8.7* 9.0  PHOS 3.8 3.2 4.4   Liver Function Tests: Recent Labs  Lab 10/22/20 0552 10/23/20 1351 10/25/20 1330  ALBUMIN 2.8* 3.1* 2.9*   No results for input(s): LIPASE, AMYLASE in the last 168 hours. CBC: Recent Labs  Lab 10/23/20 1351 10/25/20 1330  WBC 6.7 6.0  HGB 10.4* 10.3*  HCT 34.6* 35.1*  MCV 98.3 100.0  PLT 226 257   Blood Culture No results found for: SDES, SPECREQUEST, CULT, REPTSTATUS  Cardiac Enzymes: No results for input(s): CKTOTAL, CKMB, CKMBINDEX, TROPONINI in the last 168 hours. CBG: Recent Labs  Lab 10/27/20 1120 10/27/20 1624 10/27/20 2137 10/28/20 0600 10/28/20 1134  GLUCAP 263* 410* 210* 106* 234*   Iron Studies: No results for input(s): IRON, TIBC, TRANSFERRIN, FERRITIN in the last 72 hours. '@lablastinr3'$ @ Studies/Results: No results found. Medications:   amLODipine  10 mg Oral Daily   carvedilol  25 mg Oral BID WC   Chlorhexidine Gluconate Cloth  6 each Topical Q0600    clopidogrel  75 mg Oral Daily   dantrolene  50 mg Oral BID   darbepoetin (ARANESP) injection - DIALYSIS  200 mcg Intravenous Q Tue-HD   diclofenac Sodium  2 g Topical QID   famotidine  20 mg Oral Daily   gabapentin  100 mg Oral QHS   hydrALAZINE  50 mg Oral Q8H   insulin aspart  0-5 Units Subcutaneous QHS   insulin aspart  0-6 Units Subcutaneous TID WC   insulin glargine  14 Units Subcutaneous QHS   melatonin  3 mg Oral QHS   polyethylene glycol  17 g Oral Daily   rosuvastatin  10 mg Oral Daily   senna-docusate  1 tablet Oral BID   tiZANidine  2 mg Oral QHS     Dialysis Orders: New start, no OP orders - Accepted at [facility] 2nd shift TTS 1st HD on 5/16. [place] placed by [facility] IR on 5/16. R brachiobaslilic AVF placed on 0000000 by Dr. Donzetta Matters. Goal EDW 55.5kg.    Assessment/Plan: Recent CVA with right sided spastic hemiparesis - now in CIR. ESRD - new start - continue with HD on TTS schedule.  Accepted to [facility] - see above.  Tolerated dialysis well yesterday.  Next HD on 10/28/20.  K 3.8. Anemia 2/2 ESRD. Last hgb 10.3. tsat 15% on 5/15 - IV iron repletion with 1g  Fe completed. On high dose ESA, reduce dose with Hgb>10. 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  No VDRA or binders. Nutrition: renal diet, carb modified. Alb 2.9 - protein supplements. Hypertension: Blood pressure elevated today, continue amlodipine '10mg'$  qd, hydralazine '50mg'$  TID and increase coreg '25mg'$  BID.  Hold meds prior to HD. Appears euvolemic on exam.  Minimal UF with HD.  EDW appears to be around 55.5kg. Vascular access: First stage, RUE brachiobasilic AVF created [id number] by Dr. Donzetta Matters  Diabetes - per primary.   Tanesia Butner H. Favour Aleshire NP-C 10/28/2020, 2:58 PM  [employer] [phone]

## 2020-10-28 NOTE — Progress Notes (Signed)
Renal Navigator has confirmed with [facility] [place] Eligibility Coordinator that patient is pre-certified for transportation services. Navigator informed CIR CSW.   Alphonzo Cruise, [place] Renal Navigator [phone]

## 2020-10-28 NOTE — Progress Notes (Signed)
Physical Therapy Session Note  Patient Details  Name: Hannah [name] MRN: [id number] Date of Birth: 1966/03/25  Today's Date: 10/28/2020 PT Individual Time: 0930-1030 PT Individual Time Calculation (min): 60 min   Short Term Goals: Week 1:  PT Short Term Goal 1 (Week 1): Pt will be able to perform bed mobility with supervision PT Short Term Goal 1 - Progress (Week 1): Not met PT Short Term Goal 2 (Week 1): Pt will be able to perform transfers with CGA PT Short Term Goal 2 - Progress (Week 1): Met PT Short Term Goal 3 (Week 1): Pt will be able to gait x 50' with min assist PT Short Term Goal 3 - Progress (Week 1): Met PT Short Term Goal 4 (Week 1): Pt will be able to perform curb step negotiion with RW with mod assist PT Short Term Goal 4 - Progress (Week 1): Not met Week 2:  PT Short Term Goal 1 (Week 2): Pt will be able to perform curb step negotiion with RW with mod assist PT Short Term Goal 2 (Week 2): pt to demonstrate bed mobility CGA PT Short Term Goal 3 (Week 2): pt to demonstrate functional transfers wiht LRAD CGA consistently    Skilled Therapeutic Interventions/Progress Updates:    pt received in bed and agreeable to therapy. Pt denied pain throughout. Pt requested to go outside, taken in [facility] total A for time. Pt directed in gait training with Rolling walker 150' CGA with VC for increased B step height and anterior placement of R hip for improved posture with good effect. Pt directed in seated BLE and BUE strengthening exercises set to music to improve endurance and increased cadence of mobility with tactile cues throughout for increased mobility and attention to RUE. Emphasis being on hip flexion, knee extension, DF/PF, shoulder rolls, shrugs, trunk lateral lean and anterior/posterior trunk leans throughout. Pt then returned to unit total A for AFO consult with [employer] rep present. Pt directed in gait training with Rolling walker CGA 50' and representative able to assess gait and make  recommendations for bracing and reports to return with AFO. Pt requested to rest in room and directed in in [facility] total A to room and left in [facility] All needs in reach and in good condition. Call light in hand.  And alarm set.   Therapy Documentation Precautions:  Precautions Precautions: Fall Precaution Comments: HD access port RUQ, AV graft maturing in Rt UE, spastic Rt hemiplegia at baseline Restrictions Weight Bearing Restrictions: No General:   Vital Signs: Therapy Vitals Temp: 98.7 F (37.1 C) Temp Source: Oral Pulse Rate: 100 Resp: 18 BP: (!) 147/80 Patient Position (if appropriate): Lying Oxygen Therapy SpO2: 97 % O2 Device: Room Air Pain: Pain Assessment Pain Scale: 0-10 Pain Score: 4  Pain Type: Acute pain Pain Location: Hip Pain Radiating Towards: leg Pain Descriptors / Indicators: Aching Pain Intervention(s): Medication (See eMAR) Mobility:   Locomotion :    Trunk/Postural Assessment :    Balance:   Exercises:   Other Treatments:      Therapy/Group: Individual Therapy  Junie Panning 10/28/2020, 4:08 PM

## 2020-10-28 NOTE — Progress Notes (Signed)
Patient ID: Hannah [name], female   DOB: 1966-04-30, 55 y.o.   MRN: [id number] Team Conference Report to Patient/Family  Team Conference discussion was reviewed with the patient and caregiver, including goals, any changes in plan of care and target discharge date.  Patient and caregiver express understanding and are in agreement.  The patient has a target discharge date of 11/03/20.  SW met with patient, provided conference updates. Patient informed of extension until Monday. Was down but felt better with Dr. Dagoberto Ligas allowing her to visit her family outside. Informed patient of [facility] [place] approval. No additional questions or concerns.  Hannah [name] 10/28/2020, 2:01 PM

## 2020-10-28 NOTE — Progress Notes (Signed)
PROGRESS NOTE   Subjective/Complaints:    Pt doing well- wants to leave Friday, but told cannot start HD outpt on Saturday- which is true- will either need to d/c tomorrow or Monday- will arrange for pt to see [facility] for new AFO- lost hers in 2009 for RLE weakness- to help reduce risk of fall.    ROS:   Pt denies SOB, abd pain, CP, N/V/C/D, and vision changes   Objective:   No results found. Recent Labs    10/25/20 1330  WBC 6.0  HGB 10.3*  HCT 35.1*  PLT 257   Recent Labs    10/25/20 1330  NA 132*  K 3.8  CL 97*  CO2 26  GLUCOSE 258*  BUN 18  CREATININE 5.39*  CALCIUM 9.0    Intake/Output Summary (Last 24 hours) at 10/28/2020 1032 Last data filed at 10/28/2020 0738 Gross per 24 hour  Intake 365 ml  Output 350 ml  Net 15 ml        Physical Exam: Vital Signs Blood pressure 139/73, pulse 88, temperature 98.3 F (36.8 C), temperature source Oral, resp. rate 18, weight 59.2 kg, SpO2 97 %.    General: awake, alert, appropriate, nervous about d/c;  NAD HENT: conjugate gaze; oropharynx moist CV: regular rate; no JVD Pulmonary: CTA B/L; no W/R/R- good air movement GI: soft, NT, ND, (+)BS Psychiatric: appropriate; but slightly anxious- wants to see grandson;  Neurological: R facial weakness- alert Neurological: alert- MAS of 1+ in RUE- mainly elbow- better than last week.  Skin: intact except R chest HD catheter- no change Neurological:     Right upper extremity with 4/5 hand grip- strength otherwise intact. RLE with 4/5 proximally and 3-/5 at ankle DF/PFLeft sided strength intact   Assessment/Plan: 1. Functional deficits which require 3+ hours per day of interdisciplinary therapy in a comprehensive inpatient rehab setting. Physiatrist is providing close team supervision and 24 hour management of active medical problems listed below. Physiatrist and rehab team continue to assess barriers to  discharge/monitor patient progress toward functional and medical goals  Care Tool:  Bathing    Body parts bathed by patient: Right arm, Left arm, Chest, Abdomen, Face, Front perineal area, Buttocks, Right upper leg, Left upper leg, Right lower leg, Left lower leg         Bathing assist Assist Level: Contact Guard/Touching assist     Upper Body Dressing/Undressing Upper body dressing   What is the patient wearing?: Pull over shirt    Upper body assist Assist Level: Supervision/Verbal cueing    Lower Body Dressing/Undressing Lower body dressing      What is the patient wearing?: Pants, Underwear/pull up     Lower body assist Assist for lower body dressing: Supervision/Verbal cueing     Toileting Toileting Toileting Activity did not occur (Clothing management and hygiene only): N/A (no void or bm)  Toileting assist Assist for toileting: Supervision/Verbal cueing     Transfers Chair/bed transfer  Transfers assist     Chair/bed transfer assist level: Contact Guard/Touching assist Chair/bed transfer assistive device: Programmer, multimedia   Ambulation assist      Assist level: Contact Guard/Touching assist Assistive  device: Walker-rolling Max distance: 90   Walk 10 feet activity   Assist     Assist level: Contact Guard/Touching assist Assistive device: Walker-rolling   Walk 50 feet activity   Assist Walk 50 feet with 2 turns activity did not occur: Safety/medical concerns (fatigue)  Assist level: Contact Guard/Touching assist Assistive device: Walker-rolling    Walk 150 feet activity   Assist Walk 150 feet activity did not occur: Safety/medical concerns  Assist level: Contact Guard/Touching assist Assistive device: Walker-rolling    Walk 10 feet on uneven surface  activity   Assist Walk 10 feet on uneven surfaces activity did not occur: Safety/medical concerns         Wheelchair     Assist Will patient use  wheelchair at discharge?: Yes Type of Wheelchair:  (uses power chair at home sometimes on bad days)    Wheelchair assist level: Dependent - Patient 0%      Wheelchair 50 feet with 2 turns activity    Assist        Assist Level: Dependent - Patient 0%   Wheelchair 150 feet activity     Assist      Assist Level: Dependent - Patient 0%   Blood pressure 139/73, pulse 88, temperature 98.3 F (36.8 C), temperature source Oral, resp. rate 18, weight 59.2 kg, SpO2 97 %.   Medical Problem List and Plan: 1.  Cardiopulmonary debility- also with hx of CVA and right spastic hemi with footdrop May benefit from AFO eval prior to discharge             -patient may  not shower             -ELOS/Goals: 2-3 weeks MinA             con't PT and OT- pt wants to follow up with me- explained she will, but I'm not the PCP.   Team conference today to determine if can leave tomorrow- ordering AFO consult for R AFO; con't PT and OT- see if either tomorrow or Monday for d/c.  2.  Antithrombotics: -DVT/anticoagulation:  Mechanical: Sequential compression devices, below knee Bilateral lower extremities             -antiplatelet therapy: Plavix on hold until 6/9, discussed with patient.  6/9- restarted Plavix- pt also asked about it.  3. Pain Management: Continue Oxycodone prn.              -on Tizanidine 2 mg tid as per home regimen.  Muscle relaxer's contraindicated with HD--->changed to 2 mg HS per recommendations.   6/8- pain itself is controlled- spasticity as below  6/11- pain is controlled- con't regimen             --Continue low dose gabapentin.  4. Mood: LCSW to follow for evaluation and support.              -antipsychotic agents: N/A 5. Neuropsych: This patient is capable of making decisions on her own behalf. 6. Skin/Wound Care: Routine pressure relief measures.  7. Fluids/Electrolytes/Nutrition: Strict I/O.  8. ESRD: Now on HD MWF--schedule at the end of the day              --has been clipped for [facility]  6/7- pt is upset- feeling like they've taken too much fluid off- I'm not clear if they need to add midodrine, etc?  6/8- pt felt much better after HD this time- got 1L taken off- con't regimen per renal  6/10- having "hot" and itching feelings after HD- I know itching is normal- will check in the "hot" feeling- no fever  6/12- went well- con't regimen 9. HTN: Monitor BP--continue Norvasc, Coreg bid, apresoline tid   6/8- BP slightly elevated today- will monitor for trend before changing meds   6/10- BP still a little elevated, but stable- might need to increase Coreg, however will see what Nephrology says- don't want to drop her BP during HD. 6/14- BP 139/70- con't regimen 10 Hyponatremia: SSRI d/c by nephrology with slow improved.  11. Nausea: Continue reglan bid-> wean to off?  6/8- will make reglan prn- BID prn- so can ask for if needs it.  12. Constipation: Managed with senna S bid and miralax daily.   6/7- LBM 2 days ago- normally goes 1-2x/week, so normal for her- if no BM by tomorrow, will intervene  6/8- LBM last night- con't regimen 13. Hypoglycemia with hx of DM: decrease Lantus HS to 9U.  6/6- BGs 167-196 in last 24 hours- will give it 24 hours and titrate Lantus as required  6/7- will increase Lantus to 10 units nightly- and monitor  6/8- BGs much better since increased Lantus last night- 107-185- vs in 200s the whole time- con't to monitor  6/9- Bgs 150s-mid 200s - might need Novolog with meals?- will d/w pt.   6/10- will try to increase Lantus to 12 units- will monitor  6/11- restart QHS SSI- con't regimen  6/12- Bgs overall in 100s- will increase Lantus to 14 units- and monitor  6/14- BG's 106- 263 - but also 1 episode of 410?- MORE variable- will need to titrate up Lantus at home for home diet.  14. Spasticity  6/6- will start Dantrolene 25 mg  6/8- if still doing well tommorow will increase to 50 mg QHS  6/9- increased  dantrolene to 50 mg QHS- will se ehow tolerates.   6/10- spasticity a little better- con't titration of Dantrolene. 6/11- will increase before d/c.  6/14- will increase Dantrolene to 50 mg BID for tomorrow.   15. Hypotension after HD  6/6- might do better with midodrine or decreasing BP meds? Pt thinks it's due to taking too much fluid off.   6/7- will let HD team know with report today  6/8- Sx's resolved with taking off less fluid.  16. R foot drop  6/14- ordered AFO consult via [facility]   LOS: 10 days A FACE TO FACE EVALUATION WAS PERFORMED  Jahid Weida 10/28/2020, 10:32 AM

## 2020-10-28 NOTE — Progress Notes (Signed)
Occupational Therapy Session Note  Patient Details  Name: Hannah [name] MRN: [id number] Date of Birth: 1966-04-20  Today's Date: 10/28/2020 OT Individual Time: 3710-6269 and 4854-6270 OT Individual Time Calculation (min): 56 min and 54 min   Short Term Goals: Week 1:  OT Short Term Goal 1 (Week 1): Pt will complete LB dressing with CGA at sit<stand level using LRAD as needed OT Short Term Goal 1 - Progress (Week 1): Met OT Short Term Goal 2 (Week 1): Pt will complete an ambulatory toilet transfer with no more than Min A while using LRAD OT Short Term Goal 2 - Progress (Week 1): Met OT Short Term Goal 3 (Week 1): Pt will complete an ambulatory shower transfer with no more than Min A while using LRAD OT Short Term Goal 3 - Progress (Week 1): Progressing toward goal Week 2:  OT Short Term Goal 1 (Week 2): STGs = LTGs d/t ELOS    Skilled Therapeutic Interventions/Progress Updates:    Session 1: Pt greeted at time of session supine in bed resting, no pain. Pt stating she is feeling "sad" today after having confirmation that she will not be going home Thurs/Friday d/t not wanting to have pt start HD OP on a Saturday. Pt very emotional and crying, emotional support provided. RN entered at this time with med pass as well. Supine > sit Supervision and ambulated bed > bathroom > wheelchair at sink with CGA and RW. Mild increase in tone noted today. CGA for toileting tasks as well. UB bathing set up, LB bathing CGA for sit <> stand level including buttocks and able to reach feet. UB dress Min for bra, set up for pull over shirt and CGA for LB dressing. Set up for oral hygiene. Set up with alarm on call bell in reach.   Session 2: Pt greeted at time of session sitting up in wheelchair agreeable to OT session, no pain. Pt transported room > gym dependent for time. Pt stating she feels like she has difficulty with some grasping of items, grip strength measured and 12# in R hand and 41# in L hand for  quantitative measurements. Provided blue level sponge for grip strengthening as well. 2kg red therapy ball for chest press, overhead press, and FWD lean for anterior weight shift 1x15 each. Rep for [employer] present for this time, fitting pt for RLE AFO. Pt then ambulating approx 50 feet in gym saying "I love it." Discussion of purpose of AFO and preventing foot drop. Pt transported back to room, alarm intact and call bell in reach.    Therapy Documentation Precautions:  Precautions Precautions: Fall Precaution Comments: HD access port RUQ, AV graft maturing in Rt UE, spastic Rt hemiplegia at baseline Restrictions Weight Bearing Restrictions: No     Therapy/Group: Individual Therapy  Viona Gilmore 10/28/2020, 7:07 AM

## 2020-10-28 NOTE — Progress Notes (Signed)
Inpatient Diabetes Program Recommendations  AACE/ADA: New Consensus Statement on Inpatient Glycemic Control (2015)  Target Ranges:  Prepandial:   less than 140 mg/dL      Peak postprandial:   less than 180 mg/dL (1-2 hours)      Critically ill patients:  140 - 180 mg/dL   Lab Results  Component Value Date   GLUCAP 234 (H) 10/28/2020   HGBA1C 8.7 (H) 09/13/2020    Review of Glycemic Control Results for ACADIA, PAHLS (MRN [id number]) as of 10/28/2020 13:11  Ref. Range 10/27/2020 06:05 10/27/2020 11:20 10/27/2020 16:24 10/27/2020 21:37 10/28/2020 06:00 10/28/2020 11:34  Glucose-Capillary Latest Ref Range: 70 - 99 mg/dL 197 (H) 263 (H) 410 (H) 210 (H) 106 (H) 234 (H)   Diabetes history: DM2 Outpatient Diabetes medications: Glipizide 10 mg QD, Tresiba 15-45 QHS, Lispro 10 units TID Current orders for Inpatient glycemic control: Lantus 14 QHS, Novolog 0-6 units TID & 0-5 units QHS  Inpatient Diabetes Program Recommendations:    Received referral for inpatient DM coordinator due to labile.  Post prandials are elevated which can lead to hyperglycemia and over correction.  Please consider:  Novolog 5 units TID with meals if consumes at least 50%.  Will continue to follow while inpatient.  Thank you, Reche Dixon, RN, BSN Diabetes Coordinator Inpatient Diabetes Program [phone] (team pager from 8a-5p)

## 2020-10-29 DIAGNOSIS — R42 Dizziness and giddiness: Secondary | ICD-10-CM | POA: Diagnosis not present

## 2020-10-29 DIAGNOSIS — I69398 Other sequelae of cerebral infarction: Secondary | ICD-10-CM | POA: Diagnosis not present

## 2020-10-29 DIAGNOSIS — R5381 Other malaise: Secondary | ICD-10-CM | POA: Diagnosis not present

## 2020-10-29 DIAGNOSIS — N186 End stage renal disease: Secondary | ICD-10-CM | POA: Diagnosis not present

## 2020-10-29 LAB — GLUCOSE, CAPILLARY
Glucose-Capillary: 140 mg/dL — ABNORMAL HIGH (ref 70–99)
Glucose-Capillary: 254 mg/dL — ABNORMAL HIGH (ref 70–99)
Glucose-Capillary: 305 mg/dL — ABNORMAL HIGH (ref 70–99)
Glucose-Capillary: 133 mg/dL — ABNORMAL HIGH (ref 70–99)

## 2020-10-29 LAB — HEPATITIS B SURFACE ANTIBODY,QUALITATIVE: Hep B S Ab: REACTIVE — AB

## 2020-10-29 MED ORDER — INSULIN ASPART 100 UNIT/ML IJ SOLN
5.0000 [IU] | Freq: Three times a day (TID) | INTRAMUSCULAR | Status: DC
  Administered 2020-10-29 – 2020-11-03 (×13): 5 [IU] via SUBCUTANEOUS

## 2020-10-29 NOTE — Progress Notes (Signed)
Renal Navigator called patient's daughter/Miesha to confirm plans for outpatient HD at patient's discharge. She states understanding that her mother will be discharged on 11/03/20 and start at [facility] clinic on Tuesday, 11/04/20 and needs to arrive at 11:00am on her first day. [name] will take her that day and stay while she signs her paperwork. Navigator explained that patient needs to arrive at 11:25am all TTS following.  We reviewed patient's application for [facility] and that she is pre-certified and that Miesha should contact them to change address. She can schedule transportation any time within 24 hrs prior and no more than 7 days in advance by calling [phone]. She asked if the clinic staff will come to the parking lot to wheel patient in to the building. Navigator informed that the staff will not come out to get patient and that she should ask all transportation questions to [facility], as these are important questions. Navigator asked about her mom's progress and if she is able to stand and pivot from her wheelchair to recliner at this time without any assistance. [name] says she is doing much better, but that she needs supervision/may need some assistance at times with transferring. Navigator explained that for liability reasons, clinic staff cannot be hands on with assistance for transfers and will recommends a hoyer pad for HD treatments, especially as patients can be very tired after treatments. If she does not require, she will have it just in case she feels weak. Navigator discussed with CIR team.  [name] was extremely appreciative of the call and support Navigator has provided throughout her mother's hospitalization. Navigator provided her with clinic address and phone number and asked her to call Navigator any time with questions prior to discharge and the clinic after discharge. She states understanding.   Alphonzo Cruise, [place] Renal Navigator [phone]

## 2020-10-29 NOTE — Progress Notes (Signed)
Physical Therapy Session Note  Patient Details  Name: Hannah [name] MRN: [id number] Date of Birth: 1965-09-08  Today's Date: 10/29/2020 PT Individual Time: [id number] PT Individual Time Calculation (min): 43 min   Short Term Goals: Week 2:  PT Short Term Goal 1 (Week 2): Pt will be able to perform curb step negotiion with RW with mod assist PT Short Term Goal 2 (Week 2): pt to demonstrate bed mobility CGA PT Short Term Goal 3 (Week 2): pt to demonstrate functional transfers wiht LRAD CGA consistently  Skilled Therapeutic Interventions/Progress Updates:   Patient received sitting up in wc, agreeable to PT. She reports onset of R hip pain, especially with weight bearing, but did not rate. PT providing repositioning, distractions and ice at end of session to assist with pain management. PT transporting patient in wc to therapy gym for time management and energy conservation. She requested to complete a puzzle to take her "mind off of not going home." She was able to stand for ~3 mins at a time with up to MinA to come to stand and close supervision/CGA while standing. Patient with R AFO donned, but maintained some slight flexion throughout stance. No buckling evident though. Patient requiring up to Max cues to complete complex puzzle accurately. Seated rest breaks due to fatigue throughout. Patient returning to room in wc, chair alarm on, call light within reach, ice on hip.    Therapy Documentation Precautions:  Precautions Precautions: Fall Precaution Comments: HD access port RUQ, AV graft maturing in Rt UE, spastic Rt hemiplegia at baseline Restrictions Weight Bearing Restrictions: No    Therapy/Group: Individual Therapy  Karoline Caldwell, PT, DPT, CBIS  10/29/2020, 7:39 AM

## 2020-10-29 NOTE — Progress Notes (Signed)
PROGRESS NOTE   Subjective/Complaints:    Changed d/c date to Monday- so can start outpt HD Tuesday.  Pt ok with this plan.  Walking better with AFO on RLE>  Bowels- hard to push out- esp since had stroke-  Wears depends- but asking if nursing can help her "go".   Also d/w pt Novolog starting with meals- since BG's are uncontrolled.    ROS:   Pt denies SOB, abd pain, CP, N/V/C/D, and vision changes   Objective:   No results found. Recent Labs    10/28/20 1526  WBC 4.4  HGB 10.8*  HCT 35.3*  PLT 206   Recent Labs    10/28/20 1526  NA 135  K 3.6  CL 100  CO2 27  GLUCOSE 191*  BUN 18  CREATININE 4.34*  CALCIUM 8.8*    Intake/Output Summary (Last 24 hours) at 10/29/2020 0845 Last data filed at 10/29/2020 0615 Gross per 24 hour  Intake 480 ml  Output 1800 ml  Net -1320 ml        Physical Exam: Vital Signs Blood pressure 140/71, pulse 94, temperature 97.6 F (36.4 C), resp. rate 15, weight 58 kg, SpO2 100 %.     General: awake, alert, appropriate, sitting up in bed; NAD HENT: conjugate gaze; oropharynx moist; R facial weakness- chronic CV: regular rate; no JVD Pulmonary: CTA B/L; no W/R/R- good air movement GI: soft, NT, ND, (+)BS Psychiatric: appropriate- a little anxious about bowel issues Neurological: alert- MAS of 1+ in RUE- mainly elbow- better than last week.  Skin: intact except R chest HD catheter- no change Neurological:     Right upper extremity with 4/5 hand grip- strength otherwise intact. RLE with 4/5 proximally and 3-/5 at ankle DF/PFLeft sided strength intact   Assessment/Plan: 1. Functional deficits which require 3+ hours per day of interdisciplinary therapy in a comprehensive inpatient rehab setting. Physiatrist is providing close team supervision and 24 hour management of active medical problems listed below. Physiatrist and rehab team continue to assess barriers to  discharge/monitor patient progress toward functional and medical goals  Care Tool:  Bathing    Body parts bathed by patient: Right arm, Left arm, Chest, Abdomen, Face, Front perineal area, Buttocks, Right upper leg, Left upper leg, Right lower leg, Left lower leg         Bathing assist Assist Level: Contact Guard/Touching assist     Upper Body Dressing/Undressing Upper body dressing   What is the patient wearing?: Pull over shirt    Upper body assist Assist Level: Supervision/Verbal cueing    Lower Body Dressing/Undressing Lower body dressing      What is the patient wearing?: Pants     Lower body assist Assist for lower body dressing: Supervision/Verbal cueing     Toileting Toileting Toileting Activity did not occur (Clothing management and hygiene only): N/A (no void or bm)  Toileting assist Assist for toileting: Supervision/Verbal cueing     Transfers Chair/bed transfer  Transfers assist     Chair/bed transfer assist level: Contact Guard/Touching assist Chair/bed transfer assistive device: Programmer, multimedia   Ambulation assist      Assist level: Contact Guard/Touching  assist Assistive device: Walker-rolling Max distance: 90   Walk 10 feet activity   Assist     Assist level: Contact Guard/Touching assist Assistive device: Walker-rolling   Walk 50 feet activity   Assist Walk 50 feet with 2 turns activity did not occur: Safety/medical concerns (fatigue)  Assist level: Contact Guard/Touching assist Assistive device: Walker-rolling    Walk 150 feet activity   Assist Walk 150 feet activity did not occur: Safety/medical concerns  Assist level: Contact Guard/Touching assist Assistive device: Walker-rolling    Walk 10 feet on uneven surface  activity   Assist Walk 10 feet on uneven surfaces activity did not occur: Safety/medical concerns         Wheelchair     Assist Will patient use wheelchair at discharge?:  Yes Type of Wheelchair:  (uses power chair at home sometimes on bad days)    Wheelchair assist level: Dependent - Patient 0%      Wheelchair 50 feet with 2 turns activity    Assist        Assist Level: Dependent - Patient 0%   Wheelchair 150 feet activity     Assist      Assist Level: Dependent - Patient 0%   Blood pressure 140/71, pulse 94, temperature 97.6 F (36.4 C), resp. rate 15, weight 58 kg, SpO2 100 %.   Medical Problem List and Plan: 1.  Cardiopulmonary debility- also with hx of CVA and right spastic hemi with footdrop May benefit from AFO eval prior to discharge             -patient may  not shower             -ELOS/Goals: 2-3 weeks MinA             con't PT and OT- pt wants to follow up with me- explained she will, but I'm not the PCP.   Team conference today to determine if can leave tomorrow- ordering AFO consult for R AFO; con't PT and OT- see if either tomorrow or Monday for d/c.   Con't CIR_ PT and OT- got AFO- walking better- moved d/c date to 6/20- next Monday 2.  Antithrombotics: -DVT/anticoagulation:  Mechanical: Sequential compression devices, below knee Bilateral lower extremities             -antiplatelet therapy: Plavix on hold until 6/9, discussed with patient.  6/9- restarted Plavix- pt also asked about it.  3. Pain Management: Continue Oxycodone prn.              -on Tizanidine 2 mg tid as per home regimen.  Muscle relaxer's contraindicated with HD--->changed to 2 mg HS per recommendations.   6/8- pain itself is controlled- spasticity as below  6/11- pain is controlled- con't regimen             --Continue low dose gabapentin.  4. Mood: LCSW to follow for evaluation and support.              -antipsychotic agents: N/A 5. Neuropsych: This patient is capable of making decisions on her own behalf. 6. Skin/Wound Care: Routine pressure relief measures.  7. Fluids/Electrolytes/Nutrition: Strict I/O.  8. ESRD: Now on HD MWF--schedule at the  end of the day             --has been clipped for [facility]  6/7- pt is upset- feeling like they've taken too much fluid off- I'm not clear if they need to add midodrine, etc?  6/8- pt  felt much better after HD this time- got 1L taken off- con't regimen per renal  6/10- having "hot" and itching feelings after HD- I know itching is normal- will check in the "hot" feeling- no fever  6/15- dialysis going well 9. HTN: Monitor BP--continue Norvasc, Coreg bid, apresoline tid   6/8- BP slightly elevated today- will monitor for trend before changing meds   6/10- BP still a little elevated, but stable- might need to increase Coreg, however will see what Nephrology says- don't want to drop her BP during HD. 6/15- BP 140/71- con't regimen 10 Hyponatremia: SSRI d/c by nephrology with slow improved.  11. Nausea: Continue reglan bid-> wean to off?  6/8- will make reglan prn- BID prn- so can ask for if needs it.  12. Constipation- difficulty pushing out stool: Managed with senna S bid and miralax daily.   6/7- LBM 2 days ago- normally goes 1-2x/week, so normal for her- if no BM by tomorrow, will intervene  6/8- LBM last night- con't regimen  6/15- LBM yesterday but hard to push out- wants 'help"- explained since stool pretty soft, laxatives won't help- can try Dig stim/manual disimpaction AS NEEDED- will order PRN 13. Hypoglycemia with hx of DM: decrease Lantus HS to 9U.  6/6- BGs 167-196 in last 24 hours- will give it 24 hours and titrate Lantus as required  6/7- will increase Lantus to 10 units nightly- and monitor  6/8- BGs much better since increased Lantus last night- 107-185- vs in 200s the whole time- con't to monitor  6/9- Bgs 150s-mid 200s - might need Novolog with meals?- will d/w pt.   6/10- will try to increase Lantus to 12 units- will monitor  6/11- restart QHS SSI- con't regimen  6/12- Bgs overall in 100s- will increase Lantus to 14 units- and monitor  6/14- BG's 106- 263 - but  also 1 episode of 410?- MORE variable- will need to titrate up Lantus at home for home diet. 6/15- called DM coordinators consult- suggested Novolog 5 units with meals- explained to pt she needs since Lantus isn't working well enough and cannot do pills easily on HD- she agreed  14. Spasticity  6/6- will start Dantrolene 25 mg  6/8- if still doing well tommorow will increase to 50 mg QHS  6/9- increased dantrolene to 50 mg QHS- will se ehow tolerates.   6/10- spasticity a little better- con't titration of Dantrolene. 6/11- will increase before d/c.  6/14- will increase Dantrolene to 50 mg BID for tomorrow.   15. Hypotension after HD  6/6- might do better with midodrine or decreasing BP meds? Pt thinks it's due to taking too much fluid off.   6/7- will let HD team know with report today  6/8- Sx's resolved with taking off less fluid.  16. R foot drop  6/14- ordered AFO consult via [employer]  6/15- got AFO- doing better with gait.    LOS: 11 days A FACE TO FACE EVALUATION WAS PERFORMED  Hannah [name] 10/29/2020, 8:45 AM

## 2020-10-29 NOTE — Progress Notes (Signed)
Recreational Therapy Session Note  Patient Details  Name: Hannah [name] MRN: [id number] Date of Birth: Aug 24, 1965 Today's Date: 10/29/2020  Pain: no c/o Skilled Therapeutic Interventions/Progress Updates: Pt sitting in w/c upon arrival, head down, flat affect.  Pt stating she said she was ok with staying til Monday, but in fact she is not.  Pt expresses great desire to go home, tired of being here, needs to be at home with her family.  Pt states understanding the rationale for extending LOS.  Emotional support provided.  Discussed outing previously planned for tomorrow and pt agreeable at this time although she is concerned it may make her more homesick.  Discussed alternatives for the outing should she decline it tomorrow.  Pt agreeable to these as well.    Therapy/Group: Individual Therapy  Hannah [name] 10/29/2020, 3:35 PM

## 2020-10-29 NOTE — Progress Notes (Signed)
Physical Therapy Session Note  Patient Details  Name: Hannah [name] MRN: [id number] Date of Birth: [birth year]  Today's Date: 10/29/2020 PT Individual Time: 0900-1014 PT Individual Time Calculation (min): 74 min   Short Term Goals: Week 2:  PT Short Term Goal 1 (Week 2): Pt will be able to perform curb step negotiion with RW with mod assist PT Short Term Goal 2 (Week 2): pt to demonstrate bed mobility CGA PT Short Term Goal 3 (Week 2): pt to demonstrate functional transfers wiht LRAD CGA consistently  Skilled Therapeutic Interventions/Progress Updates: Pt presents sitting EOB w/ nursing assist for donning shoes.  Pt states got self dressed.  Pt agreeable to therapy although some fatigue 2/2 Benadryl taken.  Pt performed sit to stand w/ Supervision and increased time and amb 4' to w/c w/ RW and CGA.  Pt wheeled to gym for energy and time conservation.  Pt amb. W/ RW and CGA to Nu-step.  Pt performed 4' x 2 trials at Level 3 w/ verbal cues for maintaining neutral rotation R hip.  Pt required short rest break between trials 2/2 fatigue.  Pt amb multiple trials w/ RW and AFO RLE.  Pt amb w/ and w/o heel lift, but pt states feels better w/ it.  Pt amb w/ decreased heel strike as IC, but improves w/ increased distance.  Pt amb up to 150' w/ CGA.  Pt performed reaching outside of BOS and crossing midline using BUEs stacking/unstacking cones.  Pt also performed standing on [employer] cushion.  Pt c/o fatigue and required seated rest breaks.  Pt returned to room and remained sitting in w/c w/ chair alarm on and all needs in reach.     Therapy Documentation Precautions:  Precautions Precautions: Fall Precaution Comments: HD access port RUQ, AV graft maturing in Rt UE, spastic Rt hemiplegia at baseline Restrictions Weight Bearing Restrictions: No General:   Vital Signs: Therapy Vitals Temp: 97.6 F (36.4 C) Pulse Rate: 94 Resp: 15 BP: 140/71 Patient Position (if appropriate): Lying Oxygen  Therapy SpO2: 100 % O2 Device: Room Air Pain:0/10 Pain Assessment Pain Scale: 0-10 Pain Score: 0-No pain Pain Location: Generalized Pain Descriptors / Indicators: Other (Comment) (Itching) Pain Intervention(s): Medication (See eMAR) Mobility:       Therapy/Group: Individual Therapy  Ladoris Gene 10/29/2020, 10:17 AM

## 2020-10-29 NOTE — Progress Notes (Signed)
Patient ID: Hannah [name], female   DOB: Aug 22, 1965, 55 y.o.   MRN: [id number]  SW provided pt with a handicapped placement card application

## 2020-10-29 NOTE — Progress Notes (Signed)
Occupational Therapy Session Note  Patient Details  Name: Hannah [name] MRN: [id number] Date of Birth: 08/12/1965  Today's Date: 10/29/2020 OT Individual Time: 1132-1200 and 1301-1356 OT Individual Time Calculation (min): 28 min and 55 min   Short Term Goals: Week 1:  OT Short Term Goal 1 (Week 1): Pt will complete LB dressing with CGA at sit<stand level using LRAD as needed OT Short Term Goal 1 - Progress (Week 1): Met OT Short Term Goal 2 (Week 1): Pt will complete an ambulatory toilet transfer with no more than Min A while using LRAD OT Short Term Goal 2 - Progress (Week 1): Met OT Short Term Goal 3 (Week 1): Pt will complete an ambulatory shower transfer with no more than Min A while using LRAD OT Short Term Goal 3 - Progress (Week 1): Progressing toward goal Week 2:  OT Short Term Goal 1 (Week 2): STGs = LTGs d/t ELOS  Skilled Therapeutic Interventions/Progress Updates:    Pt greeted at time of session sitting up in wheelchair agreeable to OT session, no pain. Pt wanting to go outside, NT performing BS check at beginning of session prior to going outside. Trandported outside dependently, notable change uplifting mood going outside. Continued discussion for DC planning home on Monday and IADL techniques for performing leisure activities like gardening from raised beds, going outside during cooler hours, etc. And pt receptive. Transported back inside, set up alarm on call bell in reach.  Session 2: Pt greeted at time of session sitting up in wheelchair finishing lunch, no pain. Wheelchair transport room <> gym for time conservation, walked short distance to N[place]ep close supervision. Set up and take down, performed for 8 minutes on level 6 with no fatigue or pain before ambulating back to w/c same manner. 2 rounds of dynamic standing at BITS for 2:00 and 3:30 with first round size 4 circles only alternating unilateral support w/ 69% accuracy and second round words/verbal recognition w/ 55%  accuracy using BUEs. Notable decrease of visual scanning/acuity on R side from previous CVA. 2x10 passive stretches for B shoulder flexion and R shoulder abduction w/ 1# dowel for support, demonstrated how she could perform with cane at home. Transport back to room, alarm on call bell in reach.   Therapy Documentation Precautions:  Precautions Precautions: Fall Precaution Comments: HD access port RUQ, AV graft maturing in Rt UE, spastic Rt hemiplegia at baseline Restrictions Weight Bearing Restrictions: No     Therapy/Group: Individual Therapy  Hannah C Spach 10/29/2020, 7:23 AM 

## 2020-10-30 DIAGNOSIS — R5381 Other malaise: Secondary | ICD-10-CM | POA: Diagnosis not present

## 2020-10-30 DIAGNOSIS — I69398 Other sequelae of cerebral infarction: Secondary | ICD-10-CM | POA: Diagnosis not present

## 2020-10-30 DIAGNOSIS — N186 End stage renal disease: Secondary | ICD-10-CM | POA: Diagnosis not present

## 2020-10-30 DIAGNOSIS — R42 Dizziness and giddiness: Secondary | ICD-10-CM | POA: Diagnosis not present

## 2020-10-30 LAB — RENAL FUNCTION PANEL
Albumin: 3 g/dL — ABNORMAL LOW (ref 3.5–5.0)
Anion gap: 9 (ref 5–15)
BUN: 22 mg/dL — ABNORMAL HIGH (ref 6–20)
CO2: 27 mmol/L (ref 22–32)
Calcium: 9 mg/dL (ref 8.9–10.3)
Chloride: 96 mmol/L — ABNORMAL LOW (ref 98–111)
Creatinine, Ser: 4.97 mg/dL — ABNORMAL HIGH (ref 0.44–1.00)
GFR, Estimated: 10 mL/min — ABNORMAL LOW (ref >60)
Glucose, Bld: 258 mg/dL — ABNORMAL HIGH (ref 70–99)
Phosphorus: 5.2 mg/dL — ABNORMAL HIGH (ref 2.5–4.6)
Potassium: 4.3 mmol/L (ref 3.5–5.1)
Sodium: 132 mmol/L — ABNORMAL LOW (ref 135–145)

## 2020-10-30 LAB — GLUCOSE, CAPILLARY
Glucose-Capillary: 68 mg/dL — ABNORMAL LOW (ref 70–99)
Glucose-Capillary: 80 mg/dL (ref 70–99)
Glucose-Capillary: 205 mg/dL — ABNORMAL HIGH (ref 70–99)
Glucose-Capillary: 84 mg/dL (ref 70–99)
Glucose-Capillary: 256 mg/dL — ABNORMAL HIGH (ref 70–99)

## 2020-10-30 LAB — CBC
HCT: 37.7 % (ref 36.0–46.0)
Hemoglobin: 11.1 g/dL — ABNORMAL LOW (ref 12.0–15.0)
MCH: 29.1 pg (ref 26.0–34.0)
MCHC: 29.4 g/dL — ABNORMAL LOW (ref 30.0–36.0)
MCV: 98.7 fL (ref 80.0–100.0)
Platelets: 197 K/uL (ref 150–400)
RBC: 3.82 MIL/uL — ABNORMAL LOW (ref 3.87–5.11)
RDW: 17.4 % — ABNORMAL HIGH (ref 11.5–15.5)
WBC: 5.2 K/uL (ref 4.0–10.5)
nRBC: 0 % (ref 0.0–0.2)

## 2020-10-30 MED ORDER — SODIUM CHLORIDE 0.9 % IV SOLN: 100.0000 mL | INTRAVENOUS | Status: DC | PRN

## 2020-10-30 MED ORDER — PENTAFLUOROPROP-TETRAFLUOROETH EX AERO: 1.0000 "application " | INHALATION_SPRAY | CUTANEOUS | Status: DC | PRN

## 2020-10-30 MED ORDER — LIDOCAINE-PRILOCAINE 2.5-2.5 % EX CREA: 1.0000 "application " | TOPICAL_CREAM | CUTANEOUS | Status: DC | PRN

## 2020-10-30 MED ORDER — HEPARIN SODIUM (PORCINE) 1000 UNIT/ML IJ SOLN
INTRAMUSCULAR | Status: AC
  Administered 2020-10-30: 3200 [IU] via INTRAVENOUS
  Filled 2020-10-30: qty 4

## 2020-10-30 MED ORDER — INSULIN GLARGINE 100 UNIT/ML ~~LOC~~ SOLN
12.0000 [IU] | Freq: Every day | SUBCUTANEOUS | Status: DC
  Administered 2020-10-30 – 2020-11-02 (×4): 12 [IU] via SUBCUTANEOUS
  Filled 2020-10-30 (×5): qty 0.12

## 2020-10-30 MED ORDER — KIDNEY FAILURE BOOK: Freq: Once | Status: DC

## 2020-10-30 MED ORDER — LIDOCAINE HCL (PF) 1 % IJ SOLN: 5.0000 mL | INTRAMUSCULAR | Status: DC | PRN

## 2020-10-30 MED ORDER — ALTEPLASE 2 MG IJ SOLR: 2.0000 mg | Freq: Once | INTRAMUSCULAR | Status: DC | PRN

## 2020-10-30 NOTE — Progress Notes (Signed)
Physical Therapy Session Note  Patient Details  Name: Hannah [name] MRN: [id number] Date of Birth: [birth year]  Today's Date: 10/30/2020 PT Individual Time: 3354-5625 and 1030-1155 PT Individual Time Calculation (min): 60 min and 85 mins  Short Term Goals: Week 1:  PT Short Term Goal 1 (Week 1): Pt will be able to perform bed mobility with supervision PT Short Term Goal 1 - Progress (Week 1): Not met PT Short Term Goal 2 (Week 1): Pt will be able to perform transfers with CGA PT Short Term Goal 2 - Progress (Week 1): Met PT Short Term Goal 3 (Week 1): Pt will be able to gait x 50' with min assist PT Short Term Goal 3 - Progress (Week 1): Met PT Short Term Goal 4 (Week 1): Pt will be able to perform curb step negotiion with RW with mod assist PT Short Term Goal 4 - Progress (Week 1): Not met Week 2:  PT Short Term Goal 1 (Week 2): Pt will be able to perform curb step negotiion with RW with mod assist PT Short Term Goal 2 (Week 2): pt to demonstrate bed mobility CGA PT Short Term Goal 3 (Week 2): pt to demonstrate functional transfers wiht LRAD CGA consistently    Skilled Therapeutic Interventions/Progress Updates:    pt received sitting EOB and agreeable to therapy. Pt denied pain throughout. Pt's daughter present and had assisted pt in getting dressed with shirt and doing hair. Pt reported she needed to use restroom, directed in donning grip socks EOB min A, Sit to stand to Rolling walker supervision, and gait to toilet CGA. Pt required min  Afor clothing management, unable to void bladder. Pt then directed in sitting to change out of night pants into day pants. Pt directed in gait to bedside, Rolling walker CGA and directed in changing socks min A and donning shoes with Rt AFO max A to complete. Pt directed in Stand pivot transfer to [facility] with Rolling walker supervision. Pt taken outside in [facility] total A for time and energy. Pt directed in ascending/descending  curb step x2 min  A initially and CGA  second time for stability to ascend. Pt also given single step cues for walker placement at curb, stepping closer to curb and walker placement onto curb before stepping. Pt able to demonstrate good safety technique with this. Pt had multiple questions regarding DC plans and was somewhat upset that she couldn't go home earlier however felt better once PT reinforced this would give her daughter extra time to move into ground level apartment today, get medications orders at pharmacy, setup follow up PT. Pt agreed and reported she felt much better. Pt reported she was excited to see her family tomorrow which also lifted her spirits. Pt then directed in returning to unit in [facility] total A for time. Pt directed in gait from room door to window side of room CGA with Rolling walker 10' with VC for stride length. Pt directed in returning to [facility],  and left sitting in WC, All needs in reach and in good condition. Call light in hand.  And alarm set.   Session 2:  pt received in [facility]  and agreeable to therapy. PT, recreational therapist and pt present for pt to complete community outing. Pt taken off unit with nursing aware and denied any medical needs prior to leaving for outing. Pt taken up to [facility], and directed in ascending one 13.5" step with min A and a hand rail to complete, and one shorter  step into van, then Hand held assist to bench seat in [place] with CGA to complete this. Pt then educated on community outing goals for improving transfer techniques, gait in community setting, self image and confidence with community re-integration, and addressing techniques  energy conservation while out in community and barriers to community integration. Pt agreed and verbalized understanding to all. Once at store, pt directed in descending same stairs at min A with Hand held assist and then directed in transfer to [facility]. Pt taken into store in [facility] at total A for time and energy and educated on technique with finding WC ramp to safely get over  curb while in [place]. Once in store, pt directed in gait in aisle with Rolling walker 100' CGA with multiple short static standing and reaching for high and low shelving for items and educated to place items either in Rolling walker bag/basket or hand to family during shopping to limit fall risk, pt demonstrated good ability. Pt educated on taking breaks and returning to [facility] to rest and reported she knew when to rest for energy conservation. Pt completed rest of outing in [facility] 2/2 fatigue and reported she felt hot inside store which worsened her fatigue. Once out of check out, pt taken in [facility] to van max A for energy. Pt directed in ascending same steps into van mod A this rep 2/2 increased fatigue and stepping into high step without curb assist. Pt educated in overview of trip and reviewed all outing goals with extra time spent on safety awareness, energy conservation and promoting improved self image in community with need of AD. Pt reported she felt better about this and felt better about completing with family. Pt returned to hospital in good condition used ramp to exit van in [facility] and returned to room in [facility] total A for time. Pt left in WC, All needs in reach and in good condition. Call light in hand.  And alarm set. Nursing aware.    Therapy Documentation Precautions:  Precautions Precautions: Fall Precaution Comments: HD access port RUQ, AV graft maturing in Rt UE, spastic Rt hemiplegia at baseline Restrictions Weight Bearing Restrictions: No General:   Vital Signs: Therapy Vitals Temp: 98.8 F (37.1 C) Temp Source: Oral Pulse Rate: 87 Resp: 18 BP: 133/65 Patient Position (if appropriate): Lying Oxygen Therapy SpO2: 97 % O2 Device: Room Air Pain: Pain Assessment Pain Scale: 0-10 Pain Score: 0-No pain Pain Type: Acute pain Pain Location: Hip Pain Orientation: Right Pain Radiating Towards: leg Pain Descriptors / Indicators: Aching Pain Frequency: Intermittent Pain Onset: Unable to  tell Patients Stated Pain Goal: 4 Pain Intervention(s): Medication (See eMAR) Mobility:   Locomotion :    Trunk/Postural Assessment :    Balance:   Exercises:   Other Treatments:      Therapy/Group: Individual Therapy  Junie Panning 10/30/2020, 1:10 PM

## 2020-10-30 NOTE — Progress Notes (Signed)
[facility] Therapy Discharge Summary Patient Details  Name: [name] MRN: [id number] Date of Birth: [birth year] Today's Date: 10/30/2020  Long term goals set: 1  Long term goals met: 1  Comments on progress toward goals: Pt has made great progress during LOS and is ready for discharge home with family to provide 24 hour supervision/assistance.  TR sessions focused on activity analysis/adaptations, energy conservation, relaxation strategies, community reintegreation.  Pt is discharging at contact guard assist level for community mobility.  Reasons goals not met: n/a  Equipment acquired: n/a  Reasons for discharge: treatment goals met   Patient/family agrees with progress made and goals achieved: Yes  [name],[name] 10/30/2020, 8:16 AM

## 2020-10-30 NOTE — Progress Notes (Addendum)
Physical Therapy Discharge Summary  Patient Details  Name: Hannah [name] MRN: [id number] Date of Birth: 09/03/1965  Today's Date: 10/30/2020 PT Individual Time:  -       Patient has met 7 of 7 long term goals due to improved activity tolerance, improved balance, improved postural control, increased strength, and functional use of  right lower extremity.  Patient to discharge at an ambulatory level Supervision.   Patient's care partner is independent to provide the necessary physical assistance at discharge.  Reasons goals not met: all met  Recommendation:  Patient will benefit from ongoing skilled PT services in outpatient setting to continue to advance safe functional mobility, address ongoing impairments in activity tolerance, gait and stair training, WC mobility, standing balance, strengthening, and minimize fall risk.  Equipment: Pt has manual WC and rolling walker at home at baseline  Reasons for discharge: discharge from hospital  Patient/family agrees with progress made and goals achieved: Yes  PT Discharge Precautions/Restrictions Precautions Precautions: Fall Precaution Comments: HD access port RUQ, AV graft maturing in Rt UE, spastic Rt hemiplegia at baseline Vital Signs Therapy Vitals Temp: 98.7 F (37.1 C) Pulse Rate: 92 Resp: 14 BP: (!) 141/67 Patient Position (if appropriate): Lying Oxygen Therapy SpO2: 100 % O2 Device: Room Air Pain Pain Assessment Pain Scale: 0-10 Pain Score: 0-No pain Vision/Perception  Perception Perception: Within Functional Limits Praxis Praxis: Intact  Cognition Overall Cognitive Status: Within Functional Limits for tasks assessed Arousal/Alertness: Awake/alert Sensation Sensation Light Touch: Impaired Detail Light Touch Impaired Details: Impaired RLE Coordination Gross Motor Movements are Fluid and Coordinated: No Fine Motor Movements are Fluid and Coordinated: No Coordination and Movement Description: Affected by  residual spastic Rt hemplegia Motor  Motor Motor: Hemiplegia;Abnormal tone;Abnormal postural alignment and control Motor - Discharge Observations: residual R hemiparesis/spasticity  Mobility Bed Mobility Bed Mobility: Rolling Left;Supine to Sit;Sit to Supine Rolling Left: Supervision/Verbal cueing Supine to Sit: Supervision/Verbal cueing Sit to Supine: Supervision/Verbal cueing Transfers Transfers: Stand to Sit;Sit to Stand;Stand Pivot Transfers Sit to Stand: Supervision/Verbal cueing Stand to Sit: Supervision/Verbal cueing Stand Pivot Transfers: Supervision/Verbal cueing Stand Pivot Transfer Details: Verbal cues for safe use of DME/AE;Verbal cues for technique Locomotion  Gait Ambulation: Yes Gait Assistance: Contact Guard/Touching assist Gait Distance (Feet): 100 Feet Assistive device: Rolling walker Gait Assistance Details: Verbal cues for precautions/safety;Verbal cues for gait pattern Gait Gait: Yes Gait Pattern:  (decreased weightbearing through RLE, decreased step length, pain limiting, maintains R PF) Gait velocity: decreased Stairs / Additional Locomotion Stairs: Yes Stairs Assistance: Contact Guard/Touching assist Stair Management Technique: Two rails;Step to pattern Number of Stairs: 4 Height of Stairs: 6 Ramp: Contact Guard/touching assist Curb: Nurse, mental health Mobility: Yes Wheelchair Assistance: Moderate Assistance - Patient 50 - 74% Wheelchair Propulsion: Both upper extremities;Both lower extermities Wheelchair Parts Management: Needs assistance  Trunk/Postural Assessment  Cervical Assessment Cervical Assessment: Exceptions to [facility] (forward head) Thoracic Assessment Thoracic Assessment: Exceptions to [facility] (rounded shoulders) Lumbar Assessment Lumbar Assessment: Exceptions to [facility] (posterior pelvic tilt) Postural Control Postural Control: Within Functional Limits  Balance Balance Balance Assessed: Yes Static  Sitting Balance Static Sitting - Level of Assistance: 6: Modified independent (Device/Increase time) Dynamic Sitting Balance Dynamic Sitting - Balance Support: During functional activity;Feet supported Dynamic Sitting - Level of Assistance: 5: Stand by assistance Dynamic Sitting - Balance Activities: Lateral lean/weight shifting;Forward lean/weight shifting;Reaching for objects;Reaching across midline Static Standing Balance Static Standing - Balance Support: Left upper extremity supported;During functional activity Static Standing - Level of Assistance: 5: Stand  by assistance Dynamic Standing Balance Dynamic Standing - Balance Support: During functional activity;No upper extremity supported Dynamic Standing - Level of Assistance: 5: Stand by assistance (close supervision) Dynamic Standing - Balance Activities: Lateral lean/weight shifting;Forward lean/weight shifting;Reaching for objects;Reaching across midline;Reaching for weighted objects Extremity Assessment      RLE Assessment RLE Assessment: Exceptions to [facility] Strength Comments: grossly3- to 3+/5; pain also limiting hip ROM/strength RLE Tone RLE Tone: Hypertonic;Modified Ashworth Hamstrings - Modified Ashworth Scale for Grading Hypertonia RLE: Slight increase in muscle tone, manifested by a catch, followed by minimal resistance throughout the remainder (less than half) of the ROM QUADRICEPS - Modified Ashworth Scale for Grading Hypertonia RLE: Slight increase in muscle tone, manifested by a catch, followed by minimal resistance throughout the remainder (less than half) of the ROM GASTROCNEMIUS - Modified Ashworth Scale for Grading Hypertonia RLE: Slight increase in muscle tone, manifested by a catch and release or by minimal resistance at the end of the range of motion when the affected part(s) is moved in flexion or extension SOLEUS - Modified Ashworth Scale for Grading Hypertonia RLE: Slight increase in muscle tone, manifested  by a catch and release or by minimal resistance at the end of the range of motion when the affected part(s) is moved in flexion or extension LLE Assessment LLE Assessment: Exceptions to [facility] Strength Comments: grossly 4/5    Junie Panning 10/30/2020, 7:22 AM

## 2020-10-30 NOTE — Progress Notes (Signed)
Rounded on patient today in correlation to transition to outpatient HD and request for dietary information. Ordered consult to dietician and Kidney Failure book. Patient educated at the bedside regarding care of tunneled dialysis catheter, AV fistula/graft site care, assessment of thrill daily and proper medication administration on HD days.  Patient also educated on the importance of adhering to scheduled dialysis treatments, the effects of fluid overload, hyperkalemia and hyperphosphatemia. Patient capable of re-verbalizing via teach back method. Patient reports that her daughter will help assist her and that she really wants to be here to see her grandchildren grow up. Also educated patient on services available through the interdisciplinary team in the clinic setting. Patient with no further questions at this time. Handouts and contact information provided to patient for any further assistance. Will follow as appropriate.   Dorthey Sawyer, RN  Dialysis Nurse Coordinator Phone: [phone]

## 2020-10-30 NOTE — Progress Notes (Signed)
Occupational Therapy Session Note  Patient Details  Name: Hannah [name] MRN: [id number] Date of Birth: Dec 26, 1965  Today's Date: 10/30/2020 OT Individual Time: 3074-6002 OT Individual Time Calculation (min): 55 min    Short Term Goals: Week 1:  OT Short Term Goal 1 (Week 1): Pt will complete LB dressing with CGA at sit<stand level using LRAD as needed OT Short Term Goal 1 - Progress (Week 1): Met OT Short Term Goal 2 (Week 1): Pt will complete an ambulatory toilet transfer with no more than Min A while using LRAD OT Short Term Goal 2 - Progress (Week 1): Met OT Short Term Goal 3 (Week 1): Pt will complete an ambulatory shower transfer with no more than Min A while using LRAD OT Short Term Goal 3 - Progress (Week 1): Progressing toward goal Week 2:  OT Short Term Goal 1 (Week 2): STGs = LTGs d/t ELOS    Skilled Therapeutic Interventions/Progress Updates:    Pt greeted at time of session sitting up in wheelchair agreeable to OT session no pain. Walked chair > bathroom with close supervision/CGA w/ RW, pt did urinate and NT aware and recorded. 3/3 toileting tasks Supervision/CGA before walking to sink same manner. Pt transported via wheelchair to nurses station to [facility] for sharpening her pencils for IADL task, needed Min A for pressure. RN at this time needing to do med pass, returned to room and med pass performed. Remainder of session focus on BUE there ex with 2# dowel for bicep curl, chest press, and overhead press for 2x10. Pt up in chair alarm on call bell in reach.    Therapy Documentation Precautions:  Precautions Precautions: Fall Precaution Comments: HD access port RUQ, AV graft maturing in Rt UE, spastic Rt hemiplegia at baseline Restrictions Weight Bearing Restrictions: No      Therapy/Group: Individual Therapy  Viona Gilmore 10/30/2020, 7:15 AM

## 2020-10-30 NOTE — Progress Notes (Signed)
[facility] Progress Note   Subjective: Seen on HD. No issues, tolerating well.   Objective Vitals:   10/30/20 1251 10/30/20 1300 10/30/20 1330 10/30/20 1430  BP: (!) 108/57 133/65 124/68 117/75  Pulse: 88 87 90 87  Resp: 18     Temp:      TempSrc:      SpO2:      Weight:       Physical Exam General: Pleasant, well appearing female in NAD Heart: S1,S2. Radiation from AVF heard across precordium. No M/R/G Lungs: CTAB A/P Abdomen Active BS NT Extremities: No LE edema Dialysis Access: L AVF + T/B. Maturing well. RIJ TDC Drsg CDI.    Additional Objective Labs: Basic Metabolic Panel: Recent Labs  Lab 10/25/20 1330 10/28/20 1526 10/30/20 1247  NA 132* 135 132*  K 3.8 3.6 4.3  CL 97* 100 96*  CO2 '26 27 27  '$ GLUCOSE 258* 191* 258*  BUN 18 18 22*  CREATININE 5.39* 4.34* 4.97*  CALCIUM 9.0 8.8* 9.0  PHOS 4.4 3.6 5.2*   Liver Function Tests: Recent Labs  Lab 10/25/20 1330 10/28/20 1526 10/30/20 1247  ALBUMIN 2.9* 3.0* 3.0*   No results for input(s): LIPASE, AMYLASE in the last 168 hours. CBC: Recent Labs  Lab 10/25/20 1330 10/28/20 1526 10/30/20 1247  WBC 6.0 4.4 5.2  HGB 10.3* 10.8* 11.1*  HCT 35.1* 35.3* 37.7  MCV 100.0 97.5 98.7  PLT 257 206 197   Blood Culture No results found for: SDES, SPECREQUEST, CULT, REPTSTATUS  Cardiac Enzymes: No results for input(s): CKTOTAL, CKMB, CKMBINDEX, TROPONINI in the last 168 hours. CBG: Recent Labs  Lab 10/29/20 1657 10/29/20 2101 10/30/20 0556 10/30/20 0614 10/30/20 1154  GLUCAP 305* 133* 68* 80 205*   Iron Studies: No results for input(s): IRON, TIBC, TRANSFERRIN, FERRITIN in the last 72 hours. '@lablastinr3'$ @ Studies/Results: No results found. Medications:  sodium chloride     sodium chloride      amLODipine  10 mg Oral Daily   carvedilol  25 mg Oral BID WC   Chlorhexidine Gluconate Cloth  6 each Topical Q0600   clopidogrel  75 mg Oral Daily   dantrolene  50 mg Oral BID    darbepoetin (ARANESP) injection - DIALYSIS  200 mcg Intravenous Q Tue-HD   diclofenac Sodium  2 g Topical QID   famotidine  20 mg Oral Daily   gabapentin  100 mg Oral QHS   hydrALAZINE  50 mg Oral Q8H   insulin aspart  0-5 Units Subcutaneous QHS   insulin aspart  0-6 Units Subcutaneous TID WC   insulin aspart  5 Units Subcutaneous TID WC   insulin glargine  12 Units Subcutaneous QHS   kidney failure book   Does not apply Once   melatonin  3 mg Oral QHS   polyethylene glycol  17 g Oral Daily   rosuvastatin  10 mg Oral Daily   senna-docusate  1 tablet Oral BID   tiZANidine  2 mg Oral QHS     Dialysis Orders: New start, no OP orders - Accepted at [facility] 2nd shift TTS 1st HD on 5/16. [place] placed by [facility] IR on 5/16. R brachiobaslilic AVF placed on 0000000 by Dr. Donzetta Matters. Goal EDW 55.5kg.    Assessment/Plan: Recent CVA with right sided spastic hemiparesis - now in CIR. ESRD - new start - continue with HD on TTS schedule.  Accepted to [facility] - see above.  Tolerated dialysis well yesterday.  Next HD on 10/28/20.  K 3.8. Anemia 2/2 ESRD. Last hgb 10.3. tsat 15% on 5/15 - IV iron repletion with 1g Fe completed. On high dose ESA, reduce dose with Hgb>10. 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  No VDRA or binders. Nutrition: renal diet, carb modified. Alb 2.9 - protein supplements. Hypertension: Blood pressure elevated today, continue amlodipine '10mg'$  qd, hydralazine '50mg'$  TID and increase coreg '25mg'$  BID.  Hold meds prior to HD. Appears euvolemic on exam.  Minimal UF with HD.  EDW appears to be around 55.5kg. Vascular access: First stage, RUE brachiobasilic AVF created [id number] by Dr. Donzetta Matters  Diabetes - per primary.   Disposition: Plan to discharge 11/03/2020  .   Eliana Lueth H. Cainen Burnham NP-C 10/30/2020, 2:55 PM  [employer] [phone]

## 2020-10-30 NOTE — Progress Notes (Signed)
Recreational Therapy Session Note  Patient Details  Name: Neeley Tomlinson MRN: [id number] Date of Birth: 1965-12-29 Today's Date: 10/30/2020  Pain: Time:67-12 Pain:no c/o  Patient particiapted in Community reintegration/outing to [employer] at overall contact guard assist ambulatory level for short distance using RW.  Pt did require verbal cues for walker safety and recognizing need for rest. Goals focused on promoting community mobility after discharge as pt states she would like to be out and about but previously self limited using COVID as an excuse.  Pt with concerns of how people will perceive her and the way she moves in the community.  Goals included safe community mobility, identification & negotiation of obstacles, discussion about accessing public restroom, energy conservation techniques/education.  See outing goal sheet in shadow chart for full details.  Therapy/Group: [employer]  Tigerlily Christine 10/30/2020, 8:15 AM

## 2020-10-30 NOTE — Progress Notes (Signed)
Hypoglycemic Event  CBG:68  Treatment: carb containing snacks given  Symptoms: none  Follow-up CBG: [id number] CBG Result: 80  Possible Reasons for Event: unknown  Comments/MD notified: yes    Hannah [name]

## 2020-10-30 NOTE — Progress Notes (Signed)
Patient transported to dialysis

## 2020-10-30 NOTE — Progress Notes (Signed)
PROGRESS NOTE   Subjective/Complaints:    Pt reports doing well with AFO- PT agrees based on notes.  Daughter in room- educated them about spasticity- a little is OK- a lot isn't, so will increase Dantrolene after leaves hospital to 50 mg TID vs on last day.   ROS:  Pt denies SOB, abd pain, CP, N/V/C/D, and vision changes   Objective:   No results found. Recent Labs    10/28/20 1526  WBC 4.4  HGB 10.8*  HCT 35.3*  PLT 206   Recent Labs    10/28/20 1526  NA 135  K 3.6  CL 100  CO2 27  GLUCOSE 191*  BUN 18  CREATININE 4.34*  CALCIUM 8.8*    Intake/Output Summary (Last 24 hours) at 10/30/2020 1233 Last data filed at 10/30/2020 1010 Gross per 24 hour  Intake 860 ml  Output 200 ml  Net 660 ml        Physical Exam: Vital Signs Blood pressure 140/70, pulse 88, temperature 98.7 F (37.1 C), resp. rate 14, weight 58.6 kg, SpO2 100 %.      General: awake, alert, appropriate, sitting EOB; daughter and PT in room;  NAD HENT: conjugate gaze; oropharynx moist; R facial weakness- chronic CV: regular rate; no JVD Pulmonary: CTA B/L; no W/R/R- good air movement GI: soft, NT, ND, (+)BS Psychiatric: appropriate- smiling Neurological: alert- MAS of 2 maybe 2-3 in R knee and MAS of 2 in R elbow-  Skin: intact except R chest HD catheter- no change Neurological:     Right upper extremity with 4/5 hand grip- strength otherwise intact. RLE with 4/5 proximally and 3-/5 at ankle DF/PFLeft sided strength intact   Assessment/Plan: 1. Functional deficits which require 3+ hours per day of interdisciplinary therapy in a comprehensive inpatient rehab setting. Physiatrist is providing close team supervision and 24 hour management of active medical problems listed below. Physiatrist and rehab team continue to assess barriers to discharge/monitor patient progress toward functional and medical goals  Care Tool:  Bathing     Body parts bathed by patient: Right arm, Left arm, Chest, Abdomen, Face, Front perineal area, Buttocks, Right upper leg, Left upper leg, Right lower leg, Left lower leg         Bathing assist Assist Level: Contact Guard/Touching assist     Upper Body Dressing/Undressing Upper body dressing   What is the patient wearing?: Pull over shirt    Upper body assist Assist Level: Supervision/Verbal cueing    Lower Body Dressing/Undressing Lower body dressing      What is the patient wearing?: Pants     Lower body assist Assist for lower body dressing: Supervision/Verbal cueing     Toileting Toileting Toileting Activity did not occur (Clothing management and hygiene only): N/A (no void or bm)  Toileting assist Assist for toileting: Supervision/Verbal cueing     Transfers Chair/bed transfer  Transfers assist     Chair/bed transfer assist level: Contact Guard/Touching assist Chair/bed transfer assistive device: Programmer, multimedia   Ambulation assist      Assist level: Contact Guard/Touching assist Assistive device: Walker-rolling Max distance: 150   Walk 10 feet activity  Assist     Assist level: Contact Guard/Touching assist Assistive device: Walker-rolling   Walk 50 feet activity   Assist Walk 50 feet with 2 turns activity did not occur: Safety/medical concerns (fatigue)  Assist level: Contact Guard/Touching assist Assistive device: Walker-rolling    Walk 150 feet activity   Assist Walk 150 feet activity did not occur: Safety/medical concerns  Assist level: Contact Guard/Touching assist Assistive device: Walker-rolling    Walk 10 feet on uneven surface  activity   Assist Walk 10 feet on uneven surfaces activity did not occur: Safety/medical concerns         Wheelchair     Assist Will patient use wheelchair at discharge?: Yes Type of Wheelchair:  (uses power chair at home sometimes on bad days)    Wheelchair assist  level: Dependent - Patient 0%      Wheelchair 50 feet with 2 turns activity    Assist        Assist Level: Dependent - Patient 0%   Wheelchair 150 feet activity     Assist      Assist Level: Dependent - Patient 0%   Blood pressure 140/70, pulse 88, temperature 98.7 F (37.1 C), resp. rate 14, weight 58.6 kg, SpO2 100 %.   Medical Problem List and Plan: 1.  Cardiopulmonary debility- also with hx of CVA and right spastic hemi with footdrop May benefit from AFO eval prior to discharge             -patient may  not shower             -ELOS/Goals: 2-3 weeks MinA             con't PT and OT- pt wants to follow up with me- explained she will, but I'm not the PCP.   Team conference today to determine if can leave tomorrow- ordering AFO consult for R AFO; con't PT and OT- see if either tomorrow or Monday for d/c.   Con't CIR_ PT and OT- got AFO- walking better- moved d/c date to 6/20- next Monday  -Continue CIR- PT, OT 2.  Antithrombotics: -DVT/anticoagulation:  Mechanical: Sequential compression devices, below knee Bilateral lower extremities             -antiplatelet therapy: Plavix on hold until 6/9, discussed with patient.  6/9- restarted Plavix- pt also asked about it.  3. Pain Management: Continue Oxycodone prn.              -on Tizanidine 2 mg tid as per home regimen.  Muscle relaxer's contraindicated with HD--->changed to 2 mg HS per recommendations.   6/8- pain itself is controlled- spasticity as below  6/16- pain controled- con't regimen prn             --Continue low dose gabapentin.  4. Mood: LCSW to follow for evaluation and support.              -antipsychotic agents: N/A 5. Neuropsych: This patient is capable of making decisions on her own behalf. 6. Skin/Wound Care: Routine pressure relief measures.  7. Fluids/Electrolytes/Nutrition: Strict I/O.  8. ESRD: Now on HD MWF--schedule at the end of the day             --has been clipped for [facility]  6/7- pt is upset- feeling like they've taken too much fluid off- I'm not clear if they need to add midodrine, etc?  6/8- pt felt much better after HD this time- got 1L taken  off- con't regimen per renal  6/10- having "hot" and itching feelings after HD- I know itching is normal- will check in the "hot" feeling- no fever  6/15- dialysis going well 9. HTN: Monitor BP--continue Norvasc, Coreg bid, apresoline tid   6/8- BP slightly elevated today- will monitor for trend before changing meds   6/10- BP still a little elevated, but stable- might need to increase Coreg, however will see what Nephrology says- don't want to drop her BP during HD. 6/15- BP 140/71- con't regimen 10 Hyponatremia: SSRI d/c by nephrology with slow improved.  11. Nausea: Continue reglan bid-> wean to off?  6/8- will make reglan prn- BID prn- so can ask for if needs it.  12. Constipation- difficulty pushing out stool: Managed with senna S bid and miralax daily.   6/7- LBM 2 days ago- normally goes 1-2x/week, so normal for her- if no BM by tomorrow, will intervene  6/8- LBM last night- con't regimen  6/15- LBM yesterday but hard to push out- wants 'help"- explained since stool pretty soft, laxatives won't help- can try Dig stim/manual disimpaction AS NEEDED- will order PRN 13. Hypoglycemia with hx of DM: decrease Lantus HS to 9U.  6/6- BGs 167-196 in last 24 hours- will give it 24 hours and titrate Lantus as required  6/7- will increase Lantus to 10 units nightly- and monitor  6/8- BGs much better since increased Lantus last night- 107-185- vs in 200s the whole time- con't to monitor  6/9- Bgs 150s-mid 200s - might need Novolog with meals?- will d/w pt.   6/10- will try to increase Lantus to 12 units- will monitor  6/11- restart QHS SSI- con't regimen  6/12- Bgs overall in 100s- will increase Lantus to 14 units- and monitor  6/14- BG's 106- 263 - but also 1 episode of 410?- MORE variable- will need to titrate up  Lantus at home for home diet. 6/15- called DM coordinators consult- suggested Novolog 5 units with meals- explained to pt she needs since Lantus isn't working well enough and cannot do pills easily on HD- she agreed   6/16- Bgs 68- 150s- will decrease lantus to 12 units and maintain novolog 5 units with meals. Con't that regimen 14. Spasticity  6/6- will start Dantrolene 25 mg  6/8- if still doing well tommorow will increase to 50 mg QHS  6/9- increased dantrolene to 50 mg QHS- will se ehow tolerates.   6/10- spasticity a little better- con't titration of Dantrolene. 6/11- will increase before d/c.  6/14- will increase Dantrolene to 50 mg BID for tomorrow.  6/16- wl increase dantrolene to 50 mg TID on Monday- to start the day after d/c.   15. Hypotension after HD  6/6- might do better with midodrine or decreasing BP meds? Pt thinks it's due to taking too much fluid off.   6/7- will let HD team know with report today  6/8- Sx's resolved with taking off less fluid.  16. R foot drop  6/14- ordered AFO consult via [employer]  6/15- got AFO- doing better with gait.    LOS: 12 days A FACE TO FACE EVALUATION WAS PERFORMED  Nayda Riesen 10/30/2020, 12:33 PM

## 2020-10-30 NOTE — Progress Notes (Signed)
Nutrition Brief Note  RD consulted for diet education. Pt is currently unavailable in hemodialysis. Diet handout "Eating Healthy with Kidney Disease" placed in pt's discharge instructions. RD to follow up with diet education.  Corrin Parker, MS, RD, LDN RD pager number/after hours weekend pager number on Amion.

## 2020-10-31 DIAGNOSIS — R5381 Other malaise: Secondary | ICD-10-CM | POA: Diagnosis not present

## 2020-10-31 DIAGNOSIS — R42 Dizziness and giddiness: Secondary | ICD-10-CM | POA: Diagnosis not present

## 2020-10-31 DIAGNOSIS — N186 End stage renal disease: Secondary | ICD-10-CM | POA: Diagnosis not present

## 2020-10-31 DIAGNOSIS — I69398 Other sequelae of cerebral infarction: Secondary | ICD-10-CM | POA: Diagnosis not present

## 2020-10-31 LAB — GLUCOSE, CAPILLARY
Glucose-Capillary: 99 mg/dL (ref 70–99)
Glucose-Capillary: 248 mg/dL — ABNORMAL HIGH (ref 70–99)
Glucose-Capillary: 147 mg/dL — ABNORMAL HIGH (ref 70–99)
Glucose-Capillary: 180 mg/dL — ABNORMAL HIGH (ref 70–99)

## 2020-10-31 NOTE — Progress Notes (Signed)
Physical Therapy Session Note  Patient Details  Name: Hannah [name] MRN: [id number] Date of Birth: Jul 27, 1965  Today's Date: 10/31/2020 PT Individual Time: 1135-1200 PT Individual Time Calculation (min): 25 min   Short Term Goals: Week 2:  PT Short Term Goal 1 (Week 2): Pt will be able to perform curb step negotiion with RW with mod assist PT Short Term Goal 2 (Week 2): pt to demonstrate bed mobility CGA PT Short Term Goal 3 (Week 2): pt to demonstrate functional transfers wiht LRAD CGA consistently  Skilled Therapeutic Interventions/Progress Updates:    Patient received sitting up in wc, agreeable to earlier PT session. She denies pain, but voices frustration at not going home today. Patient requesting to go outside. PT transporting patient in wc outside for time management and energy conservation. Discussed safe ambulation at home as well as energy conservation techniques to allow her to have greater independence at home. Patient ambulated ~67f with RW and CGA over uneven surface. Shortened R step length noted due to increased tone/spasticity and grossly decreased gait speed noted. Patient returning to room in wc, chair alarm on, call light within reach.   Therapy Documentation Precautions:  Precautions Precautions: Fall Precaution Comments: HD access port RUQ, AV graft maturing in Rt UE, spastic Rt hemiplegia at baseline Restrictions Weight Bearing Restrictions: No     Therapy/Group: Individual Therapy  JKaroline Caldwell PT, DPT, CBIS  10/31/2020, 7:43 AM

## 2020-10-31 NOTE — Progress Notes (Signed)
Physical Therapy Session Note  Patient Details  Name: Hannah [name] MRN: [id number] Date of Birth: 1965/10/25  Today's Date: 10/31/2020 PT Individual Time: 0801-0900 PT Individual Time Calculation (min): 59 min   Short Term Goals: Week 2:  PT Short Term Goal 1 (Week 2): Pt will be able to perform curb step negotiion with RW with mod assist PT Short Term Goal 2 (Week 2): pt to demonstrate bed mobility CGA PT Short Term Goal 3 (Week 2): pt to demonstrate functional transfers wiht LRAD CGA consistently  Skilled Therapeutic Interventions/Progress Updates:  Patient seated EOB and preparing to transfer to w/c with NT for toileting on entrance to room. This therapist taking over with transfer to toilet. Patient alert and agreeable to PT session. Patient denied pain during session.  Therapeutic Activity: Transfers: Patient performed STS and SPVT transfers using RW with close supervision. Provided verbal cues for push from seated surface to rise to stand. Toilet transfer with CGA/ supervision. Clothing mgmt and pericare with close supervision.   Sitting and standing balance challenged with dressing tasks performed with supervision. Balance challenged at sink with tooth brushing while standing and performed with pelvic and LUE support on sink throughout.   Gait Training:  Patient ambulated 110' x1/ 42' x1 using RW with CGA. Demonstrated slow pace with decreased step height/ length. Provided vc/ tc for level gaze and heel-to-toe step progression.  Neuromuscular Re-ed: NMR facilitated during session with focus on standing balance. Pt guided in static standing on Airex pad progressing to stance with no UE support requiring time for ankle strategy to normalize and then to holding 2# weighted ball and extending arms in front of her and back to chest x10. With drop of ball from outstretched arms, pt's reactive balance challenged with large counter movement and maintenance of balance. Then challenged with  same tasks performed on level floor to demonstrate good balance improvement. NMR performed for improvements in motor control and coordination, balance, sequencing, judgement, and self confidence/ efficacy in performing all aspects of mobility at highest level of independence.   Patient seated in w/c by window at end of session with brakes locked, seat alarm set, and all needs within reach including call bell, cell phone, and colored pencils with pages to color.     Therapy Documentation Precautions:  Precautions Precautions: Fall Precaution Comments: HD access port RUQ, AV graft maturing in Rt UE, spastic Rt hemiplegia at baseline Restrictions Weight Bearing Restrictions: No  Therapy/Group: Individual Therapy  Alger Simons PT, DPT 10/31/2020, 10:01 AM

## 2020-10-31 NOTE — Progress Notes (Signed)
Occupational Therapy Session Note  Patient Details  Name: Hannah [name] MRN: [id number] Date of Birth: 08-05-1965  Today's Date: 10/31/2020 OT Individual Time: 1035-1100 OT Individual Time Calculation (min): 25 min    Short Term Goals: Week 2:  OT Short Term Goal 1 (Week 2): STGs = LTGs d/t ELOS  Skilled Therapeutic Interventions/Progress Updates:    Pt received in room in wc crying. She cried for several minutes, repeating over and over "I want to go home!"  Used encouragement to help calm pt and reminded her she will go home on Monday so she only has 3 more days in the hospital. Pt kept crying but she was agreeable to leaving the room to try an activity.  Pt taken to day room and she was able to calm herself. Pt engaged in standing balance activity with large connect four floor game.  She stood with CGA and then held balance with CGA as she reached with L hand to L side all the way towards the floor level and then to get a checker.  She then transferred checker to R hand and used RUE to place in board.  Pt understood  rules of the game well and did an excellent job participating. Good use of R hand and arm.  After standing for 3 games, pt sat and worked on B shoulder ROM holding ball and reaching over head. Fairly functional ROM.    Pt returned to room and set up with seat alarm in wc.  Pt in fairly good and relaxed mood. In room with all needs met.   Therapy Documentation Precautions:  Precautions Precautions: Fall Precaution Comments: HD access port RUQ, AV graft maturing in Rt UE, spastic Rt hemiplegia at baseline Restrictions Weight Bearing Restrictions: No  Pain: Pain Assessment Pain Score: 0-No pain ADL: ADL Eating: Not assessed Grooming: Setup Where Assessed-Grooming: Edge of bed Upper Body Bathing: Supervision/safety Where Assessed-Upper Body Bathing: Edge of bed Lower Body Bathing: Contact guard Where Assessed-Lower Body Bathing: Edge of bed Upper Body Dressing:  Supervision/safety Where Assessed-Upper Body Dressing: Edge of bed Lower Body Dressing: Minimal assistance Where Assessed-Lower Body Dressing: Edge of bed Toileting: Not assessed Toilet Transfer: Not assessed Tub/Shower Transfer: Not assessed  Therapy/Group: Individual Therapy  [place] 10/31/2020, 12:48 PM

## 2020-10-31 NOTE — Progress Notes (Signed)
PROGRESS NOTE   Subjective/Complaints:   Doing well- BG this AM was 90s this AM.  Didn't eat all muffin- 50% o f muffin, all cereal and bacon eaten.   Asking about SCDs   ROS:  Pt denies SOB, abd pain, CP, N/V/C/D, and vision changes   Objective:   No results found. Recent Labs    10/28/20 1526 10/30/20 1247  WBC 4.4 5.2  HGB 10.8* 11.1*  HCT 35.3* 37.7  PLT 206 197   Recent Labs    10/28/20 1526 10/30/20 1247  NA 135 132*  K 3.6 4.3  CL 100 96*  CO2 27 27  GLUCOSE 191* 258*  BUN 18 22*  CREATININE 4.34* 4.97*  CALCIUM 8.8* 9.0    Intake/Output Summary (Last 24 hours) at 10/31/2020 1040 Last data filed at 10/31/2020 0700 Gross per 24 hour  Intake 340 ml  Output 1625 ml  Net -1285 ml        Physical Exam: Vital Signs Blood pressure (!) 151/71, pulse 94, temperature 98 F (36.7 C), temperature source Oral, resp. rate 18, weight 58.4 kg, SpO2 96 %.       General: awake, alert, appropriate, sitting up in bed- smiling; NAD HENT: conjugate gaze; oropharynx moist- chronic R facial weakness CV: regular rate; no JVD Pulmonary: CTA B/L; no W/R/R- good air movement GI: soft, NT, ND, (+)BS Psychiatric: appropriate; interactive Neurological: alert- MAS of 2 maybe 2-3 in R knee and MAS of 2 in R elbow- no change today Skin: intact except R chest HD catheter- no change Neurological:     Right upper extremity with 4/5 hand grip- strength otherwise intact. RLE with 4/5 proximally and 3-/5 at ankle DF/PFLeft sided strength intact   Assessment/Plan: 1. Functional deficits which require 3+ hours per day of interdisciplinary therapy in a comprehensive inpatient rehab setting. Physiatrist is providing close team supervision and 24 hour management of active medical problems listed below. Physiatrist and rehab team continue to assess barriers to discharge/monitor patient progress toward functional and medical  goals  Care Tool:  Bathing    Body parts bathed by patient: Right arm, Left arm, Chest, Abdomen, Face, Front perineal area, Buttocks, Right upper leg, Left upper leg, Right lower leg, Left lower leg         Bathing assist Assist Level: Contact Guard/Touching assist     Upper Body Dressing/Undressing Upper body dressing   What is the patient wearing?: Pull over shirt    Upper body assist Assist Level: Supervision/Verbal cueing    Lower Body Dressing/Undressing Lower body dressing      What is the patient wearing?: Pants     Lower body assist Assist for lower body dressing: Supervision/Verbal cueing     Toileting Toileting Toileting Activity did not occur (Clothing management and hygiene only): N/A (no void or bm)  Toileting assist Assist for toileting: Supervision/Verbal cueing     Transfers Chair/bed transfer  Transfers assist     Chair/bed transfer assist level: Contact Guard/Touching assist Chair/bed transfer assistive device: Programmer, multimedia   Ambulation assist      Assist level: Contact Guard/Touching assist Assistive device: Walker-rolling Max distance: 150  Walk 10 feet activity   Assist     Assist level: Contact Guard/Touching assist Assistive device: Walker-rolling   Walk 50 feet activity   Assist Walk 50 feet with 2 turns activity did not occur: Safety/medical concerns (fatigue)  Assist level: Contact Guard/Touching assist Assistive device: Walker-rolling    Walk 150 feet activity   Assist Walk 150 feet activity did not occur: Safety/medical concerns  Assist level: Contact Guard/Touching assist Assistive device: Walker-rolling    Walk 10 feet on uneven surface  activity   Assist Walk 10 feet on uneven surfaces activity did not occur: Safety/medical concerns         Wheelchair     Assist Will patient use wheelchair at discharge?: Yes Type of Wheelchair:  (uses power chair at home sometimes on  bad days)    Wheelchair assist level: Dependent - Patient 0%      Wheelchair 50 feet with 2 turns activity    Assist        Assist Level: Dependent - Patient 0%   Wheelchair 150 feet activity     Assist      Assist Level: Dependent - Patient 0%   Blood pressure (!) 151/71, pulse 94, temperature 98 F (36.7 C), temperature source Oral, resp. rate 18, weight 58.4 kg, SpO2 96 %.   Medical Problem List and Plan: 1.  Cardiopulmonary debility- also with hx of CVA and right spastic hemi with footdrop May benefit from AFO eval prior to discharge             -patient may  not shower             -ELOS/Goals: 2-3 weeks MinA             con't PT and OT- pt wants to follow up with me- explained she will, but I'm not the PCP.   Team conference today to determine if can leave tomorrow- ordering AFO consult for R AFO; con't PT and OT- see if either tomorrow or Monday for d/c.   Con't CIR_ PT and OT- got AFO- walking better- moved d/c date to 6/20- next Monday  -Continue CIR- PT, OT  2.  Antithrombotics: -DVT/anticoagulation:  Mechanical: Sequential compression devices, below knee Bilateral lower extremities             -antiplatelet therapy: Plavix on hold until 6/9, discussed with patient.  6/9- restarted Plavix- pt also asked about it.  3. Pain Management: Continue Oxycodone prn.              -on Tizanidine 2 mg tid as per home regimen.  Muscle relaxer's contraindicated with HD--->changed to 2 mg HS per recommendations.   6/17- pain controlled- con't regimen             --Continue low dose gabapentin.  4. Mood: LCSW to follow for evaluation and support.              -antipsychotic agents: N/A 5. Neuropsych: This patient is capable of making decisions on her own behalf. 6. Skin/Wound Care: Routine pressure relief measures.  7. Fluids/Electrolytes/Nutrition: Strict I/O.  8. ESRD: Now on HD MWF--schedule at the end of the day             --has been clipped for [facility]  6/7- pt is upset- feeling like they've taken too much fluid off- I'm not clear if they need to add midodrine, etc?  6/8- pt felt much better after HD this time- got 1L  taken off- con't regimen per renal  6/10- having "hot" and itching feelings after HD- I know itching is normal- will check in the "hot" feeling- no fever  6/15- dialysis going well  6/17- tolerated HD well yesterday- going well- con't per renal 9. HTN: Monitor BP--continue Norvasc, Coreg bid, apresoline tid   6/8- BP slightly elevated today- will monitor for trend before changing meds   6/10- BP still a little elevated, but stable- might need to increase Coreg, however will see what Nephrology says- don't want to drop her BP during HD. 6/15- BP 140/71- con't regimen 10 Hyponatremia: SSRI d/c by nephrology with slow improved.  11. Nausea: Continue reglan bid-> wean to off?  6/8- will make reglan prn- BID prn- so can ask for if needs it.  12. Constipation- difficulty pushing out stool: Managed with senna S bid and miralax daily.   6/7- LBM 2 days ago- normally goes 1-2x/week, so normal for her- if no BM by tomorrow, will intervene  6/8- LBM last night- con't regimen  6/15- LBM yesterday but hard to push out- wants 'help"- explained since stool pretty soft, laxatives won't help- can try Dig stim/manual disimpaction AS NEEDED- will order PRN  6/17- small BM yesterday- con't regimen 13. DM: decrease Lantus HS to 9U. 6/15- called DM coordinators consult- suggested Novolog 5 units with meals- explained to pt she needs since Lantus isn't working well enough and cannot do pills easily on HD- she agreed   6/16- Bgs 68- 150s- will decrease lantus to 12 units and maintain novolog 5 units with meals. Con't that regimen  7- BG's 84-256- at least not low/too low- con't regimen for now 14. Spasticity  6/6- will start Dantrolene 25 mg  6/8- if still doing well tommorow will increase to 50 mg QHS  6/9- increased dantrolene to 50 mg  QHS- will se ehow tolerates.   6/10- spasticity a little better- con't titration of Dantrolene. 6/11- will increase before d/c.  6/14- will increase Dantrolene to 50 mg BID for tomorrow.  6/16- wl increase dantrolene to 50 mg TID on Monday- to start the day after d/c.   15. Hypotension after HD  6/6- might do better with midodrine or decreasing BP meds? Pt thinks it's due to taking too much fluid off.   6/7- will let HD team know with report today  6/8- Sx's resolved with taking off less fluid.  16. R foot drop  6/14- ordered AFO consult via [facility]  6/15- got AFO- doing better with gait.  [place]  6/17- d/c Monday   LOS: 13 days A FACE TO FACE EVALUATION WAS PERFORMED  Admiral Marcucci 10/31/2020, 10:40 AM

## 2020-10-31 NOTE — Progress Notes (Signed)
Occupational Therapy Session Note  Patient Details  Name: Hannah [name] MRN: [id number] Date of Birth: [birth year]  Today's Date: 10/31/2020 OT Individual Time: [id number] Individual Time Calculation (min): 54 min    Short Term Goals: Week 2:  OT Short Term Goal 1 (Week 2): STGs = LTGs d/t ELOS   Skilled Therapeutic Interventions/Progress Updates:    Pt greeted at time of session sitting up in wheelchair agreeable to OT session, no pain throughout. Pt emotional at beginning of session saying she has been crying "all day." Emotional support provided, pt wanting to go home and feels like staying here is making her sick. Pt transported outside for uplifting mood and change of scenery, mild/moderate mood improvement with change of scenery and changing topics. Focus of session on patient education regarding renal diet/low carb diet for her DM and CKD, pt receptive to information and teach back of info from dietician. Transported back inside and ambulated with CGA/close suprevision hall > room approx 50 feet w/ RW. Reclined in bed alarm on call bell in reach.   Therapy Documentation Precautions:  Precautions Precautions: Fall Precaution Comments: HD access port RUQ, AV graft maturing in Rt UE, spastic Rt hemiplegia at baseline Restrictions Weight Bearing Restrictions: No     Therapy/Group: Individual Therapy  Viona Gilmore 10/31/2020, 12:33 PM

## 2020-11-01 LAB — CBC
HCT: 42 % (ref 36.0–46.0)
Hemoglobin: 12.6 g/dL (ref 12.0–15.0)
MCH: 29.2 pg (ref 26.0–34.0)
MCHC: 30 g/dL (ref 30.0–36.0)
MCV: 97.4 fL (ref 80.0–100.0)
Platelets: 190 10*3/uL (ref 150–400)
RBC: 4.31 MIL/uL (ref 3.87–5.11)
RDW: 17.6 % — ABNORMAL HIGH (ref 11.5–15.5)
WBC: 5.6 10*3/uL (ref 4.0–10.5)
nRBC: 0 % (ref 0.0–0.2)

## 2020-11-01 LAB — RENAL FUNCTION PANEL
Albumin: 3.3 g/dL — ABNORMAL LOW (ref 3.5–5.0)
Anion gap: 9 (ref 5–15)
BUN: 7 mg/dL (ref 6–20)
CO2: 26 mmol/L (ref 22–32)
Calcium: 8.9 mg/dL (ref 8.9–10.3)
Chloride: 99 mmol/L (ref 98–111)
Creatinine, Ser: 2.42 mg/dL — ABNORMAL HIGH (ref 0.44–1.00)
GFR, Estimated: 23 mL/min — ABNORMAL LOW (ref >60)
Glucose, Bld: 75 mg/dL (ref 70–99)
Phosphorus: 2.2 mg/dL — ABNORMAL LOW (ref 2.5–4.6)
Potassium: 3.4 mmol/L — ABNORMAL LOW (ref 3.5–5.1)
Sodium: 134 mmol/L — ABNORMAL LOW (ref 135–145)

## 2020-11-01 LAB — GLUCOSE, CAPILLARY
Glucose-Capillary: 174 mg/dL — ABNORMAL HIGH (ref 70–99)
Glucose-Capillary: 133 mg/dL — ABNORMAL HIGH (ref 70–99)
Glucose-Capillary: 95 mg/dL (ref 70–99)
Glucose-Capillary: 101 mg/dL — ABNORMAL HIGH (ref 70–99)

## 2020-11-01 MED ORDER — SODIUM CHLORIDE 0.9 % IV BOLUS
500.0000 mL | Freq: Once | INTRAVENOUS | Status: AC
  Administered 2020-11-01: 500 mL via INTRAVENOUS

## 2020-11-01 MED ORDER — HEPARIN SODIUM (PORCINE) 1000 UNIT/ML IJ SOLN
INTRAMUSCULAR | Status: AC
  Administered 2020-11-01: 1000 [IU]
  Filled 2020-11-01: qty 3

## 2020-11-01 NOTE — Progress Notes (Signed)
Called by nursing earlier tonite about elevated HR that started post dialysis. Pt baseline HR is around 95 , coreg '25mg'$  BID held prior to HD. Dialysis reportly stopped when 1.5 liters were removed and pt became tachy. Called by nursing after shift change about elevated HR.  MD called nephro Dr Jonnie Finner who recommended 500cc bolus. over 2 hours as well as EKG.

## 2020-11-01 NOTE — Progress Notes (Signed)
[facility] Progress Note   Subjective: Seen in room, no c/o's.   Objective Vitals:   10/31/20 1934 11/01/20 0511 11/01/20 0600 11/01/20 0741  BP: 137/65 (!) 144/68  (!) 116/55  Pulse: 92 90  93  Resp: '14 16  16  '$ Temp: 98.7 F (37.1 C) 97.9 F (36.6 C)  97.6 F (36.4 C)  TempSrc: Oral Oral    SpO2: 96% 99%  99%  Weight:   55.7 kg    Physical Exam General: Pleasant, well appearing female in NAD Heart: S1,S2. No M/R/G Lungs: CTAB A/P Abdomen Active BS NT Extremities: No LE edema Dialysis Access: R AVF + T/B. Maturing well. RIJ TDC Drsg CDI.    OP HD: new start, accepted at [facility] 2nd shift TTS, 1st HD was 5/16. - TDC placed by [facility] IR  0000000 - R brachiobasilic AVF placed 0000000 by Dr. Donzetta Matters - Goal EDW about 54.5- 55.5kg.    Assessment/Plan: Recent CVA with right sided spastic hemiparesis - now in CIR. ESRD - new start - cont HD TTS here.  Accepted to [facility] - see above. HD today.  Anemia 2/2 ESRD. Last hgb 10.3. tsat 15% on 5/15 - IV iron repletion with 1g Fe completed. On high dose ESA, reduce dose with Hgb>10. 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  No VDRA or binders. Nutrition: renal diet, carb modified. Alb 2.9 - protein supplements. Hypertension: Blood pressure elevated today, continue amlodipine '10mg'$  qd, hydralazine '50mg'$  TID and coreg '25mg'$  BID.  Hold meds prior to HD. Appears euvolemic on exam.  Dry wt appears to be about 55kg Vascular access: First stage, RUE brachiobasilic AVF created [id number] by Dr. Donzetta Matters  Diabetes - per primary.   Disposition: Plan is for discharge 11/03/2020  .   Kelly Splinter, MD 11/01/2020, 12:03 PM Additional Objective Labs: Basic Metabolic Panel: Recent Labs  Lab 10/25/20 1330 10/28/20 1526 10/30/20 1247  NA 132* 135 132*  K 3.8 3.6 4.3  CL 97* 100 96*  CO2 '26 27 27  '$ GLUCOSE 258* 191* 258*  BUN 18 18 22*  CREATININE 5.39* 4.34* 4.97*  CALCIUM 9.0 8.8* 9.0  PHOS 4.4 3.6 5.2*    Liver Function Tests: Recent Labs   Lab 10/25/20 1330 10/28/20 1526 10/30/20 1247  ALBUMIN 2.9* 3.0* 3.0*    No results for input(s): LIPASE, AMYLASE in the last 168 hours. CBC: Recent Labs  Lab 10/25/20 1330 10/28/20 1526 10/30/20 1247  WBC 6.0 4.4 5.2  HGB 10.3* 10.8* 11.1*  HCT 35.1* 35.3* 37.7  MCV 100.0 97.5 98.7  PLT 257 206 197    Blood Culture No results found for: SDES, SPECREQUEST, CULT, REPTSTATUS  Cardiac Enzymes: No results for input(s): CKTOTAL, CKMB, CKMBINDEX, TROPONINI in the last 168 hours. CBG: Recent Labs  Lab 10/31/20 1114 10/31/20 1645 10/31/20 2143 11/01/20 0552 11/01/20 1106  GLUCAP 248* 147* 180* 174* 133*    Iron Studies: No results for input(s): IRON, TIBC, TRANSFERRIN, FERRITIN in the last 72 hours. '@lablastinr3'$ @ Studies/Results: No results found. Medications:    amLODipine  10 mg Oral Daily   carvedilol  25 mg Oral BID WC   Chlorhexidine Gluconate Cloth  6 each Topical Q0600   clopidogrel  75 mg Oral Daily   dantrolene  50 mg Oral BID   darbepoetin (ARANESP) injection - DIALYSIS  200 mcg Intravenous Q Tue-HD   diclofenac Sodium  2 g Topical QID   famotidine  20 mg Oral Daily   gabapentin  100 mg Oral  QHS   hydrALAZINE  50 mg Oral Q8H   insulin aspart  0-5 Units Subcutaneous QHS   insulin aspart  0-6 Units Subcutaneous TID WC   insulin aspart  5 Units Subcutaneous TID WC   insulin glargine  12 Units Subcutaneous QHS   kidney failure book   Does not apply Once   melatonin  3 mg Oral QHS   polyethylene glycol  17 g Oral Daily   rosuvastatin  10 mg Oral Daily   senna-docusate  1 tablet Oral BID   tiZANidine  2 mg Oral QHS

## 2020-11-02 DIAGNOSIS — R5381 Other malaise: Secondary | ICD-10-CM | POA: Diagnosis not present

## 2020-11-02 LAB — GLUCOSE, CAPILLARY
Glucose-Capillary: 138 mg/dL — ABNORMAL HIGH (ref 70–99)
Glucose-Capillary: 148 mg/dL — ABNORMAL HIGH (ref 70–99)
Glucose-Capillary: 250 mg/dL — ABNORMAL HIGH (ref 70–99)
Glucose-Capillary: 151 mg/dL — ABNORMAL HIGH (ref 70–99)

## 2020-11-02 NOTE — Progress Notes (Signed)
(  Late Entry 11/01/2020) Dialysis called to give report during shift change stating they removed l.5 liters and the goal was to remove 2 but patient became tachy so they stopped. Patient returned to the unit. Vitals were taking by nurse tech. Patient Pulse was triggering a yellow mews..Dr. Read Drivers was contacted and stated to  increase fluid by mouth and also to contact dialysis to see what they prefer..Dialysis was contacted and stated to they will give normal saline. Oncoming nurse  notified and stated he will take over. Idamae Schuller, LPN

## 2020-11-02 NOTE — Progress Notes (Signed)
PROGRESS NOTE   Subjective/Complaints: Pt feels good this am , no dizziness Pt with increased tachy post HD, HR up in 110-120 range up from baseline of ~95, coreg was held prior to HD yesterday resumed post HD, now back tobaseline HR after 529m fluid bolus   ROS:  Pt denies SOB, abd pain, CP, N/V/C/D,    Objective:   No results found. Recent Labs    10/30/20 1247 11/01/20 1745  WBC 5.2 5.6  HGB 11.1* 12.6  HCT 37.7 42.0  PLT 197 190    Recent Labs    10/30/20 1247 11/01/20 1745  NA 132* 134*  K 4.3 3.4*  CL 96* 99  CO2 27 26  GLUCOSE 258* 75  BUN 22* 7  CREATININE 4.97* 2.42*  CALCIUM 9.0 8.9     Intake/Output Summary (Last 24 hours) at 11/02/2020 [id number] data filed at 11/02/2020 0300 Gross per 24 hour  Intake 900.8 ml  Output 1801 ml  Net -900.2 ml         Physical Exam: Vital Signs Blood pressure (!) 155/78, pulse 97, temperature 98.9 F (37.2 C), resp. rate 18, weight 56 kg, SpO2 98 %.    General: No acute distress Mood and affect are appropriate Heart: Regular rate and rhythm no rubs murmurs or extra sounds Lungs: Clear to auscultation, breathing unlabored, no rales or wheezes Abdomen: Positive bowel sounds, soft nontender to palpation, nondistended Extremities: No clubbing, cyanosis, or edema Skin: No evidence of breakdown, no evidence of rash   Neurological:     Right upper extremity with 4/5 hand grip- strength otherwise intact. RLE with 4/5 proximally and 3-/5 at ankle DF/PFLeft sided strength intact   Assessment/Plan: 1. Functional deficits which require 3+ hours per day of interdisciplinary therapy in a comprehensive inpatient rehab setting. Physiatrist is providing close team supervision and 24 hour management of active medical problems listed below. Physiatrist and rehab team continue to assess barriers to discharge/monitor patient progress toward functional and medical  goals  Care Tool:  Bathing    Body parts bathed by patient: Right arm, Left arm, Chest, Abdomen, Face, Front perineal area, Buttocks, Right upper leg, Left upper leg, Right lower leg, Left lower leg         Bathing assist Assist Level: Contact Guard/Touching assist     Upper Body Dressing/Undressing Upper body dressing   What is the patient wearing?: Pull over shirt    Upper body assist Assist Level: Supervision/Verbal cueing    Lower Body Dressing/Undressing Lower body dressing      What is the patient wearing?: Pants     Lower body assist Assist for lower body dressing: Supervision/Verbal cueing     Toileting Toileting Toileting Activity did not occur (Clothing management and hygiene only): N/A (no void or bm)  Toileting assist Assist for toileting: Supervision/Verbal cueing     Transfers Chair/bed transfer  Transfers assist     Chair/bed transfer assist level: Contact Guard/Touching assist Chair/bed transfer assistive device: WProgrammer, multimedia  Ambulation assist      Assist level: Contact Guard/Touching assist Assistive device: Walker-rolling Max distance: 150   Walk 10 feet activity  Assist     Assist level: Supervision/Verbal cueing Assistive device: Walker-rolling   Walk 50 feet activity   Assist Walk 50 feet with 2 turns activity did not occur: Safety/medical concerns (fatigue)  Assist level: Contact Guard/Touching assist Assistive device: Walker-rolling    Walk 150 feet activity   Assist Walk 150 feet activity did not occur: Safety/medical concerns  Assist level: Contact Guard/Touching assist Assistive device: Walker-rolling    Walk 10 feet on uneven surface  activity   Assist Walk 10 feet on uneven surfaces activity did not occur: Safety/medical concerns   Assist level: Contact Guard/Touching assist Assistive device: Aeronautical engineer Will patient use wheelchair at  discharge?: Yes Type of Wheelchair:  (uses power chair at home sometimes on bad days)    Wheelchair assist level: Dependent - Patient 0%      Wheelchair 50 feet with 2 turns activity    Assist        Assist Level: Dependent - Patient 0%   Wheelchair 150 feet activity     Assist      Assist Level: Dependent - Patient 0%   Blood pressure (!) 155/78, pulse 97, temperature 98.9 F (37.2 C), resp. rate 18, weight 56 kg, SpO2 98 %.   Medical Problem List and Plan: 1.  Cardiopulmonary debility- also with hx of CVA and right spastic hemi with footdrop May benefit from AFO eval prior to discharge             -patient may  not shower             -ELOS/Goals: 2-3 weeks MinA               Team conference today to determine if can leave tomorrow- ordering AFO consult for R AFO; con't PT and OT- see if either tomorrow or Monday for d/c.   Con't CIR_ PT and OT- got AFO- walking better- moved d/c date to 6/20- next Monday  -Continue CIR- PT, OT  2.  Antithrombotics: -DVT/anticoagulation:  Mechanical: Sequential compression devices, below knee Bilateral lower extremities             -antiplatelet therapy: Plavix on hold until 6/9, discussed with patient.  6/9- restarted Plavix- pt also asked about it.  3. Pain Management: Continue Oxycodone prn.              -on Tizanidine 2 mg tid as per home regimen.  Muscle relaxer's contraindicated with HD--->changed to 2 mg HS per recommendations.   6/17- pain controlled- con't regimen             --Continue low dose gabapentin.  4. Mood: LCSW to follow for evaluation and support.              -antipsychotic agents: N/A 5. Neuropsych: This patient is capable of making decisions on her own behalf. 6. Skin/Wound Care: Routine pressure relief measures.  7. Fluids/Electrolytes/Nutrition: Strict I/O.  8. ESRD: Now on HD MWF--schedule at the end of the day             --has been clipped for [facility]  6/7- pt is upset- feeling  like they've taken too much fluid off- I'm not clear if they need to add midodrine, etc?  6/8- pt felt much better after HD this time- got 1L taken off- con't regimen per renal  6/10- having "hot" and itching feelings after HD- I know itching is normal- will check in the "hot"  feeling- no fever  6/15- dialysis going well  6/17- tolerated HD well yesterday- going well- con't per renal 6/19 took off 1.5 L HD reportedly stopped shy of 2L goal due to tachy and some drop of BP but pt did not become hypotensive - cont as per nephro 9. HTN: Monitor BP--continue Norvasc, Coreg bid, apresoline tid   6/8- BP slightly elevated today- will monitor for trend before changing meds   6/10- BP still a little elevated, but stable- might need to increase Coreg, however will see what Nephrology says- don't want to drop her BP during HD. Vitals:   11/02/20 0252 11/02/20 0552  BP: 138/72 (!) 155/78  Pulse: 99 97  Resp: 18 18  Temp: 98.9 F (37.2 C) 98.9 F (37.2 C)  SpO2: 100% 98%    10 Hyponatremia: SSRI d/c by nephrology with slow improved.  11. Nausea: Continue reglan bid-> wean to off?  6/8- will make reglan prn- BID prn- so can ask for if needs it.  12. Constipation- difficulty pushing out stool: Managed with senna S bid and miralax daily.   6/7- LBM 2 days ago- normally goes 1-2x/week, so normal for her- if no BM by tomorrow, will intervene  6/8- LBM last night- con't regimen  6/15- LBM yesterday but hard to push out- wants 'help"- explained since stool pretty soft, laxatives won't help- can try Dig stim/manual disimpaction AS NEEDED- will order PRN  6/17- small BM yesterday- con't regimen 13. DM: decrease Lantus HS to 9U. 6/15- called DM coordinators consult- suggested Novolog 5 units with meals- explained to pt she needs since Lantus isn't working well enough and cannot do pills easily on HD- she agreed   6/16- Bgs 68- 150s- will decrease lantus to 12 units and maintain novolog 5 units with meals.  Con't that regimen  7- BG's 84-256- at least not low/too low- con't regimen for now 14. Spasticity  6/6- will start Dantrolene 25 mg  6/8- if still doing well tommorow will increase to 50 mg QHS  6/9- increased dantrolene to 50 mg QHS- will se ehow tolerates.   6/10- spasticity a little better- con't titration of Dantrolene. 6/11- will increase before d/c.  6/14- will increase Dantrolene to 50 mg BID for tomorrow.  6/16- wl increase dantrolene to 50 mg TID on Monday- to start the day after d/c.   15. Hypotension after HD  6/6- might do better with midodrine or decreasing BP meds? Pt thinks it's due to taking too much fluid off.   6/7- will let HD team know with report today  6/8- Sx's resolved with taking off less fluid.  16. R foot drop  6/14- ordered AFO consult via [employer]  6/15- got AFO- doing better with gait.  17. Dispo  d/c Monday 6/20 18.  Sinus tachy back at baseline after 535m bolus recommended by nephro Dr SJonnie Finner cont Coreg '25mg'$  BID, which is held on the am of HD which could also contribute to rebound tachycardia, EKG on 6/18 essentially unchanged vs 10/07/2020  LOS: 15 days A FACE TO FCorbinE Alpheus Stiff 11/02/2020, 6:08 AM

## 2020-11-02 NOTE — Progress Notes (Signed)
[facility] Progress Note   Subjective: had problems w/ tachycardia yest evening after dialysis, EKG showed sinus tach at 122 bpm. She got 500 cc NS bolus, then felt better and pulse rate normalized.   Objective Vitals:   11/02/20 1451 11/02/20 1845 11/02/20 2104 11/02/20 2207  BP: (!) 129/52 122/62 118/62 121/63  Pulse: 91 91  93  Resp: '16 16  16  '$ Temp: 98.2 F (36.8 C) 98.6 F (37 C)  98.3 F (36.8 C)  TempSrc: Oral Oral    SpO2: 100% 99%  99%  Weight:       Physical Exam General: Pleasant, well appearing female in NAD Heart: S1,S2. No M/R/G Lungs: CTAB A/P Abdomen Active BS NT Extremities: No LE edema Dialysis Access: R AVF + T/B. Maturing well. RIJ TDC Drsg CDI.    OP HD: new start, accepted at [facility] 2[facility], 1st HD was 5/16. - TDC placed by [facility]  [phone] - R brachiobasilic AVF placed [phone] by Dr. Donzetta Matters - Goal EDW about 54.5- 55.5kg.    Assessment/Plan: Recent CVA with right sided spastic hemiparesis - now in CIR. ESRD - new start - cont HD TTS here.  Accepted to [facility] - see above. Next HD Tuesday.  Hypertension: BP's wnl, taking 3 bp meds.  Euvolemic on exam. Had problems from too large UF on Sat, required NS bolus in the evening, UF was 1.3 L. Pt requesting smaller goals, agree. She is still making urine, plan for small UF goals this week, 0-1 kg.   Anemia 2/2 ESRD. Last hgb 10.3. tsat 15% on 5/15 - IV iron repletion with 1g Fe completed. On high dose ESA, reduce dose with Hgb>10. 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  No VDRA or binders. Nutrition: renal diet, carb modified. Alb 2.9 - protein supplements. Vascular access: First stage, RUE brachiobasilic AVF created [id number] by Dr. Donzetta Matters  Diabetes - per primary.   Disposition: Plan is for discharge 11/03/2020  .   Kelly Splinter, MD 11/01/2020, 12:03 PM Additional Objective Labs: Basic Metabolic Panel: Recent Labs  Lab 10/28/20 1526 10/30/20 1247 11/01/20 1745  NA 135 132* 134*  K 3.6  4.3 3.4*  CL 100 96* 99  CO2 '27 27 26  '$ GLUCOSE 191* 258* 75  BUN 18 22* 7  CREATININE 4.34* 4.97* 2.42*  CALCIUM 8.8* 9.0 8.9  PHOS 3.6 5.2* 2.2*    Liver Function Tests: Recent Labs  Lab 10/28/20 1526 10/30/20 1247 11/01/20 1745  ALBUMIN 3.0* 3.0* 3.3*    No results for input(s): LIPASE, AMYLASE in the last 168 hours. CBC: Recent Labs  Lab 10/28/20 1526 10/30/20 1247 11/01/20 1745  WBC 4.4 5.2 5.6  HGB 10.8* 11.1* 12.6  HCT 35.3* 37.7 42.0  MCV 97.5 98.7 97.4  PLT 206 197 190    Blood Culture No results found for: SDES, SPECREQUEST, CULT, REPTSTATUS  Cardiac Enzymes: No results for input(s): CKTOTAL, CKMB, CKMBINDEX, TROPONINI in the last 168 hours. CBG: Recent Labs  Lab 11/01/20 2133 11/02/20 0559 11/02/20 1205 11/02/20 1641 11/02/20 2109  GLUCAP 101* 138* 148* 250* 151*    Iron Studies: No results for input(s): IRON, TIBC, TRANSFERRIN, FERRITIN in the last 72 hours. '@lablastinr3'$ @ Studies/Results: No results found. Medications:    amLODipine  10 mg Oral Daily   carvedilol  25 mg Oral BID WC   Chlorhexidine Gluconate Cloth  6 each Topical Q0600   clopidogrel  75 mg Oral Daily   dantrolene  50 mg Oral  BID   darbepoetin (ARANESP) injection - DIALYSIS  200 mcg Intravenous Q Tue-HD   diclofenac Sodium  2 g Topical QID   famotidine  20 mg Oral Daily   gabapentin  100 mg Oral QHS   hydrALAZINE  50 mg Oral Q8H   insulin aspart  0-5 Units Subcutaneous QHS   insulin aspart  0-6 Units Subcutaneous TID WC   insulin aspart  5 Units Subcutaneous TID WC   insulin glargine  12 Units Subcutaneous QHS   kidney failure book   Does not apply Once   melatonin  3 mg Oral QHS   polyethylene glycol  17 g Oral Daily   rosuvastatin  10 mg Oral Daily   senna-docusate  1 tablet Oral BID   tiZANidine  2 mg Oral QHS

## 2020-11-02 NOTE — Progress Notes (Signed)
Pt. HR elevated and triggered yellow MEWS. New orders received from Dr. Read Drivers. EKG/BOLUS NS 0.9% at 250/h. EKG result read to Dr. Read Drivers

## 2020-11-02 NOTE — Progress Notes (Signed)
Occupational Therapy Session Note  Patient Details  Name: Hannah [name] MRN: [id number] Date of Birth: [birth year]  Today's Date: 11/02/2020 OT Group Time: 1100-1200 OT Group Time Calculation (min): 60 min  Skilled Therapeutic Interventions/Progress Updates:    Pt engaged in therapeutic w/c level dance group focusing on patient choice, UE/LE strengthening, salience, activity tolerance, and social participation. Pt was guided through various dance-based exercises involving UEs/LEs and trunk. All music was selected by group members. Emphasis placed on tone reduction through ROM and standing balance/standing endurance. Pt exhibited exceptionally high levels of participation throughout group, affect bright, incorporating her Rt side during bilateral exercises without cues. She stood with CGA to dance to 1 song. At end of tx pt was returned to the room by NT.    Therapy Documentation Precautions:  Precautions Precautions: Fall Precaution Comments: HD access port RUQ, AV graft maturing in Rt UE, spastic Rt hemiplegia at baseline Restrictions Weight Bearing Restrictions: No  Vital Signs: Therapy Vitals Temp: 98.2 F (36.8 C) Temp Source: Oral Pulse Rate: 91 Resp: 16 BP: (!) 129/52 Patient Position (if appropriate): Sitting Oxygen Therapy SpO2: 100 % O2 Device: Room Air Pain: no s/s pain during tx Pain Assessment Pain Scale: 0-10 Pain Score: 6  Pain Type: Acute pain Pain Location: Generalized Pain Descriptors / Indicators: Other (Comment) (itching) Pain Intervention(s): Medication (See eMAR) ADL: ADL Eating: Not assessed Grooming: Setup Where Assessed-Grooming: Edge of bed Upper Body Bathing: Supervision/safety Where Assessed-Upper Body Bathing: Edge of bed Lower Body Bathing: Contact guard Where Assessed-Lower Body Bathing: Edge of bed Upper Body Dressing: Supervision/safety Where Assessed-Upper Body Dressing: Edge of bed Lower Body Dressing: Minimal assistance Where  Assessed-Lower Body Dressing: Edge of bed Toileting: Not assessed Toilet Transfer: Not assessed Tub/Shower Transfer: Not assessed      Therapy/Group: Group Therapy  [name] 11/02/2020, 3:26 PM

## 2020-11-03 ENCOUNTER — Other Ambulatory Visit: Payer: Self-pay | Admitting: *Deleted

## 2020-11-03 ENCOUNTER — Other Ambulatory Visit: Payer: Self-pay

## 2020-11-03 DIAGNOSIS — I679 Cerebrovascular disease, unspecified: Secondary | ICD-10-CM

## 2020-11-03 DIAGNOSIS — E1122 Type 2 diabetes mellitus with diabetic chronic kidney disease: Secondary | ICD-10-CM

## 2020-11-03 DIAGNOSIS — G8110 Spastic hemiplegia affecting unspecified side: Secondary | ICD-10-CM

## 2020-11-03 DIAGNOSIS — N185 Chronic kidney disease, stage 5: Secondary | ICD-10-CM

## 2020-11-03 DIAGNOSIS — R5381 Other malaise: Secondary | ICD-10-CM | POA: Diagnosis not present

## 2020-11-03 DIAGNOSIS — Z794 Long term (current) use of insulin: Secondary | ICD-10-CM

## 2020-11-03 DIAGNOSIS — G35 Multiple sclerosis: Secondary | ICD-10-CM

## 2020-11-03 LAB — GLUCOSE, CAPILLARY: Glucose-Capillary: 137 mg/dL — ABNORMAL HIGH (ref 70–99)

## 2020-11-03 MED ORDER — TRESIBA FLEXTOUCH 100 UNIT/ML ~~LOC~~ SOPN: 12.0000 [IU] | PEN_INJECTOR | Freq: Every day | SUBCUTANEOUS | Status: DC

## 2020-11-03 MED ORDER — AMLODIPINE BESYLATE 10 MG PO TABS: 10.0000 mg | ORAL_TABLET | Freq: Every day | ORAL | 0 refills | Status: DC

## 2020-11-03 MED ORDER — TIZANIDINE HCL 2 MG PO TABS: 2.0000 mg | ORAL_TABLET | Freq: Every day | ORAL | 0 refills | Status: DC

## 2020-11-03 MED ORDER — POLYETHYLENE GLYCOL 3350 17 G PO PACK: 17.0000 g | PACK | Freq: Every day | ORAL | 0 refills | Status: AC

## 2020-11-03 MED ORDER — CLOPIDOGREL BISULFATE 75 MG PO TABS
75.0000 mg | ORAL_TABLET | Freq: Every day | ORAL | 0 refills | Status: AC
Start: 2020-11-03 — End: ?

## 2020-11-03 MED ORDER — HUMALOG JUNIOR KWIKPEN 100 UNIT/ML ~~LOC~~ SOPN: 5.0000 [IU] | PEN_INJECTOR | Freq: Three times a day (TID) | SUBCUTANEOUS | 0 refills | Status: DC

## 2020-11-03 MED ORDER — FAMOTIDINE 20 MG PO TABS: 20.0000 mg | ORAL_TABLET | Freq: Every day | ORAL | 0 refills | Status: DC

## 2020-11-03 MED ORDER — CARVEDILOL 25 MG PO TABS: 25.0000 mg | ORAL_TABLET | Freq: Two times a day (BID) | ORAL | 0 refills | Status: DC

## 2020-11-03 MED ORDER — TRESIBA FLEXTOUCH 100 UNIT/ML ~~LOC~~ SOPN: 12.0000 [IU] | PEN_INJECTOR | Freq: Every day | SUBCUTANEOUS | 0 refills | Status: AC

## 2020-11-03 MED ORDER — SENNOSIDES-DOCUSATE SODIUM 8.6-50 MG PO TABS: 1.0000 | ORAL_TABLET | Freq: Two times a day (BID) | ORAL | 0 refills | Status: DC | PRN

## 2020-11-03 MED ORDER — GABAPENTIN 100 MG PO CAPS: 100.0000 mg | ORAL_CAPSULE | Freq: Every day | ORAL | 0 refills | Status: DC

## 2020-11-03 MED ORDER — HUMALOG JUNIOR KWIKPEN 100 UNIT/ML ~~LOC~~ SOPN: 5.0000 [IU] | PEN_INJECTOR | Freq: Three times a day (TID) | SUBCUTANEOUS | Status: DC

## 2020-11-03 MED ORDER — HYDRALAZINE HCL 50 MG PO TABS: 50.0000 mg | ORAL_TABLET | Freq: Three times a day (TID) | ORAL | 0 refills | Status: DC

## 2020-11-03 MED ORDER — DICLOFENAC SODIUM 1 % EX GEL: 2.0000 g | Freq: Four times a day (QID) | CUTANEOUS | 0 refills | Status: DC

## 2020-11-03 MED ORDER — DANTROLENE SODIUM 25 MG PO CAPS
50.0000 mg | ORAL_CAPSULE | Freq: Three times a day (TID) | ORAL | Status: DC
  Filled 2020-11-03 (×2): qty 2

## 2020-11-03 MED ORDER — OXYCODONE-ACETAMINOPHEN 5-325 MG PO TABS: 1.0000 | ORAL_TABLET | Freq: Every day | ORAL | 0 refills | Status: DC | PRN

## 2020-11-03 MED ORDER — DANTROLENE SODIUM 50 MG PO CAPS: 50.0000 mg | ORAL_CAPSULE | Freq: Three times a day (TID) | ORAL | 0 refills | Status: DC

## 2020-11-03 NOTE — Progress Notes (Signed)
PROGRESS NOTE   Subjective/Complaints: Pt report she feels good - asked her to hold BP meds on day of HD until afterwards and check her BP to make sure she needs them.  Will send home on current regimen- will also schedule f/u with me for after d/c.  No other issues.   ROS:  Pt denies SOB, abd pain, CP, N/V/C/D, and vision changes   Objective:   No results found. Recent Labs    11/01/20 1745  WBC 5.6  HGB 12.6  HCT 42.0  PLT 190   Recent Labs    11/01/20 1745  NA 134*  K 3.4*  CL 99  CO2 26  GLUCOSE 75  BUN 7  CREATININE 2.42*  CALCIUM 8.9    Intake/Output Summary (Last 24 hours) at 11/03/2020 0906 Last data filed at 11/03/2020 [id number] Gross per 24 hour  Intake 480 ml  Output 200 ml  Net 280 ml        Physical Exam: Vital Signs Blood pressure (!) 146/72, pulse 87, temperature 98.7 F (37.1 C), temperature source Oral, resp. rate 18, weight 56.8 kg, SpO2 99 %.    General: awake, alert, appropriate, sitting up in bed; appropriately excited she's leaving; NAD HENT: conjugate gaze; oropharynx moist CV: regular rate; no JVD Pulmonary: CTA B/L; no W/R/R- good air movement GI: soft, NT, ND, (+)BS Psychiatric: appropriate Neurological: Ox3 Skin- R chest HD catheter in place-- looks good.  Neurological:     Right upper extremity with 4/5 hand grip- strength otherwise intact. RLE with 4/5 proximally and 3-/5 at ankle DF/PFLeft sided strength intact   Assessment/Plan: 1. Functional deficits which require 3+ hours per day of interdisciplinary therapy in a comprehensive inpatient rehab setting. Physiatrist is providing close team supervision and 24 hour management of active medical problems listed below. Physiatrist and rehab team continue to assess barriers to discharge/monitor patient progress toward functional and medical goals  Care Tool:  Bathing    Body parts bathed by patient: Right arm, Left arm,  Chest, Abdomen, Face, Front perineal area, Buttocks, Right upper leg, Left upper leg, Right lower leg, Left lower leg         Bathing assist Assist Level: Contact Guard/Touching assist     Upper Body Dressing/Undressing Upper body dressing   What is the patient wearing?: Pull over shirt    Upper body assist Assist Level: Supervision/Verbal cueing    Lower Body Dressing/Undressing Lower body dressing      What is the patient wearing?: Pants     Lower body assist Assist for lower body dressing: Supervision/Verbal cueing     Toileting Toileting Toileting Activity did not occur (Clothing management and hygiene only): N/A (no void or bm)  Toileting assist Assist for toileting: Supervision/Verbal cueing     Transfers Chair/bed transfer  Transfers assist     Chair/bed transfer assist level: Contact Guard/Touching assist Chair/bed transfer assistive device: Programmer, multimedia   Ambulation assist      Assist level: Contact Guard/Touching assist Assistive device: Walker-rolling Max distance: 150   Walk 10 feet activity   Assist     Assist level: Supervision/Verbal cueing Assistive device: Walker-rolling  Walk 50 feet activity   Assist Walk 50 feet with 2 turns activity did not occur: Safety/medical concerns (fatigue)  Assist level: Contact Guard/Touching assist Assistive device: Walker-rolling    Walk 150 feet activity   Assist Walk 150 feet activity did not occur: Safety/medical concerns  Assist level: Contact Guard/Touching assist Assistive device: Walker-rolling    Walk 10 feet on uneven surface  activity   Assist Walk 10 feet on uneven surfaces activity did not occur: Safety/medical concerns   Assist level: Contact Guard/Touching assist Assistive device: Aeronautical engineer Will patient use wheelchair at discharge?: Yes Type of Wheelchair:  (uses power chair at home sometimes on bad days)     Wheelchair assist level: Dependent - Patient 0%      Wheelchair 50 feet with 2 turns activity    Assist        Assist Level: Dependent - Patient 0%   Wheelchair 150 feet activity     Assist      Assist Level: Dependent - Patient 0%   Blood pressure (!) 146/72, pulse 87, temperature 98.7 F (37.1 C), temperature source Oral, resp. rate 18, weight 56.8 kg, SpO2 99 %.   Medical Problem List and Plan: 1.  Cardiopulmonary debility- also with hx of CVA and right spastic hemi with footdrop May benefit from AFO eval prior to discharge             -patient may  not shower             -ELOS/Goals: 2-3 weeks MinA               Team conference today to determine if can leave tomorrow- ordering AFO consult for R AFO; con't PT and OT- see if either tomorrow or Monday for d/c.   Con't CIR_ PT and OT- got AFO- walking better- moved d/c date to 6/20- next Monday  -d/c today- discussed BP meds- hold for HD. And give afterwards if BP looks OK- get a BP cuff.  2.  Antithrombotics: -DVT/anticoagulation:  Mechanical: Sequential compression devices, below knee Bilateral lower extremities             -antiplatelet therapy: Plavix on hold until 6/9, discussed with patient.  6/9- restarted Plavix- pt also asked about it.  3. Pain Management: Continue Oxycodone prn.              -on Tizanidine 2 mg tid as per home regimen.  Muscle relaxer's contraindicated with HD--->changed to 2 mg HS per recommendations.   6/17- pain controlled- con't regimen             --Continue low dose gabapentin.  4. Mood: LCSW to follow for evaluation and support.              -antipsychotic agents: N/A 5. Neuropsych: This patient is capable of making decisions on her own behalf. 6. Skin/Wound Care: Routine pressure relief measures.  7. Fluids/Electrolytes/Nutrition: Strict I/O.  8. ESRD: Now on HD MWF--schedule at the end of the day             --has been clipped for [facility]  6/7- pt is upset-  feeling like they've taken too much fluid off- I'm not clear if they need to add midodrine, etc?  6/8- pt felt much better after HD this time- got 1L taken off- con't regimen per renal  6/10- having "hot" and itching feelings after HD- I know itching is normal- will  check in the "hot" feeling- no fever  6/15- dialysis going well  6/17- tolerated HD well yesterday- going well- con't per renal 6/19 took off 1.5 L HD reportedly stopped shy of 2L goal due to tachy and some drop of BP but pt did not become hypotensive - cont as per nephro 9. HTN: Monitor BP--continue Norvasc, Coreg bid, apresoline tid   6/8- BP slightly elevated today- will monitor for trend before changing meds   6/10- BP still a little elevated, but stable- might need to increase Coreg, however will see what Nephrology says- don't want to drop her BP during HD. Vitals:   11/03/20 0603 11/03/20 0852  BP: (!) 147/69 (!) 146/72  Pulse: 90 87  Resp: 18   Temp: 98.7 F (37.1 C)   SpO2: 99%     10 Hyponatremia: SSRI d/c by nephrology with slow improved.  11. Nausea: Continue reglan bid-> wean to off?  6/8- will make reglan prn- BID prn- so can ask for if needs it.  12. Constipation- difficulty pushing out stool: Managed with senna S bid and miralax daily.   6/7- LBM 2 days ago- normally goes 1-2x/week, so normal for her- if no BM by tomorrow, will intervene  6/8- LBM last night- con't regimen  6/15- LBM yesterday but hard to push out- wants 'help"- explained since stool pretty soft, laxatives won't help- can try Dig stim/manual disimpaction AS NEEDED- will order PRN  6/17- small BM yesterday- con't regimen 13. DM: decrease Lantus HS to 9U. 6/15- called DM coordinators consult- suggested Novolog 5 units with meals- explained to pt she needs since Lantus isn't working well enough and cannot do pills easily on HD- she agreed   6/16- Bgs 68- 150s- will decrease lantus to 12 units and maintain novolog 5 units with meals. Con't that  regimen  7- BG's 84-256- at least not low/too low- con't regimen for now 14. Spasticity  6/6- will start Dantrolene 25 mg  6/8- if still doing well tommorow will increase to 50 mg QHS  6/9- increased dantrolene to 50 mg QHS- will se ehow tolerates.   6/10- spasticity a little better- con't titration of Dantrolene. 6/11- will increase before d/c.  6/14- will increase Dantrolene to 50 mg BID for tomorrow.  6/16- wl increase dantrolene to 50 mg TID on Monday- to start the day after d/c.   15. Hypotension after HD  6/6- might do better with midodrine or decreasing BP meds? Pt thinks it's due to taking too much fluid off.   6/7- will let HD team know with report today  6/8- Sx's resolved with taking off less fluid.  16. R foot drop  6/14- ordered AFO consult via [employer]  6/15- got AFO- doing better with gait.  17. Dispo  d/c Monday 6/20 18.  Sinus tachy back at baseline after 569m bolus recommended by nephro Dr SJonnie Finner cont Coreg '25mg'$  BID, which is held on the am of HD which could also contribute to rebound tachycardia, EKG on 6/18 essentially unchanged vs 10/07/2020  6/20- asked pt to continue to hold but needs to discuss BP issues with renal as well- also, needs to see PCP asap after d/c for DM control- she's going home on Lantus 12 units nightly as well as Novolog 5 units with meals. See if needs any changes by PCP- will see me in clinic to f/u on Spasticity- to monitor her Dantrolene.  Please make sure Dantrolene when she goes home is mg 3x/day.   LOS:  16 days A FACE TO FACE EVALUATION WAS PERFORMED  Tevion Laforge 11/03/2020, 9:06 AM

## 2020-11-03 NOTE — Discharge Summary (Signed)
Physician Discharge Summary  Patient ID: Hannah [name] MRN: [id number] DOB/AGE: Jun 09, 1965 56 y.o.  Admit date: 10/18/2020 Discharge date: 11/03/2020  Discharge Diagnoses:  Principal Problem:   Debility Active Problems:   Diabetes type 2, controlled ([place])   Essential hypertension   H/O: CVA (cerebrovascular accident)   ESRD on hemodialysis ([place])   Spastic hemiplegia due to cerebrovascular disease ([place])   Discharged Condition: good  Significant Diagnostic Studies: CT LUMBAR SPINE WO CONTRAST  Result Date: 10/06/2020 CLINICAL DATA:  Low back pain and right-sided abdominal pain. EXAM: CT LUMBAR SPINE WITHOUT CONTRAST TECHNIQUE: Multidetector CT imaging of the lumbar spine was performed without intravenous contrast administration. Multiplanar CT image reconstructions were also generated. COMPARISON:  None. FINDINGS: Segmentation: 5 lumbar type vertebral bodies. Alignment: Normal Vertebrae: No fracture or focal lesion at L4 or above. At L5-S1, there are some irregularities of the endplates, more affecting the inferior endplate of L5 than the superior endplate of S1. These are favored to represent degenerative change. However, based on the findings associated with the right ileo psoas musculature, I cannot completely rule out the possibility of discitis at this level. See below. Paraspinal and other soft tissues: Marked enlargement of the right psoas muscle with areas of increased and decreased density. This involves the right iliacus muscle to a lesser degree. The appearance seems more consistent with spontaneous ileo psoas hemorrhage than infection, but I can not completely rule out that possibility. There is not appear to be any more generalized retroperitoneal bleeding or definite inflammatory change at the paravertebral L5-S1 level. There is aortic atherosclerotic calcification. There is a low-density area in the upper pole of the right kidney which is not primarily or completely evaluated but  assumed to represent a cyst. 2.7 cm cyst with seen in this region by recent ultrasound. Disc levels: No significant disc space pathology at T12-L1 or L1-2. L2-3: Chronic left-sided disc protrusion with annular calcification. Mild narrowing of the left lateral recess. L3-4: Unremarkable interspace. L4-5: Mild bulging of the disc.  No compressive stenosis. L5-S1: Endplate irregularities and bulging of the disc as described above. Findings are favored to represent degenerative change rather than infectious discitis, though I can not completely exclude the latter. IMPRESSION: Enlargement of the right psoas muscle and to a lesser extent the upper portion of the right iliacus muscle with areas of low and high density most consistent with spontaneous intramuscular hematoma. I can not completely rule out the possibility of infection but do not favor that. Some irregularity of the endplates at 075-GRM, favored to be degenerative. Early discitis is not excluded but not favored because of the reasons enumerated above. Mild degenerative changes in the lumbar spine as above, without advanced finding or definite neural compressive stenosis. These results will be called to the ordering clinician or representative by the Radiologist Assistant, and communication documented in the PACS or [employer]. Electronically Signed   By: Nelson Chimes M.D.   On: 10/06/2020 11:46   CT PELVIS WO CONTRAST  Result Date: 10/06/2020 CLINICAL DATA:  RLQ abdomen pain EXAM: CT PELVIS WITHOUT CONTRAST TECHNIQUE: Multidetector CT imaging of the pelvis was performed following the standard protocol without intravenous contrast. COMPARISON:  Same day CT lumbar spine. FINDINGS: Urinary Tract:  The bladder is moderately distended. Bowel:  Unremarkable visualized pelvic bowel loops. Vascular/Lymphatic: There is aortoiliac atherosclerotic disease. No pathologically enlarged lymph nodes. Reproductive:  Unremarkable. Other: There is heterogeneous  collection with enlargement of the right psoas muscle, measuring approximately 12.5 x 5.2 x 5.0  cm, and to much lesser extent the upper portion of the right iliacus muscle. Musculoskeletal: No evidence of acute fracture. Symmetric bilateral SI joint degenerative change. There is no osseous destruction is/erosion. There is mild bilateral hip degenerative change. Mild endplate irregularity at L5-S1, see separately dictated lumbar spine CT. There are multiple soft tissue densities along the lower anterior abdomen, likely from medication injection. IMPRESSION: Right psoas muscle enlargement and due to an intramuscular heterogeneous collection measuring approximately 12.5 x 5.2 x 5.0 cm, most compatible with intramuscular hematoma. Infection is possible. Correlate with laboratory findings. No evidence of acute fracture. Symmetric degenerative changes involving the SI joints. Endplate irregularity at L5-S1 which is favored to be chronic. Given the iliopsoas findings, discitis-osteomyelitis or right SI joint infection cannot be completely excluded on noncontrast CT, but felt to be less likely. MRI would be useful for further evaluation. As noted on separately dictated CT of the lumbar spine, these results will be called to the ordering clinician or representative by the Radiologist Assistant, and communication documented in the PACS or [employer]. Electronically Signed   By: Maurine Simmering   On: 10/06/2020 12:12    Labs:  Basic Metabolic Panel: Recent Labs  Lab 10/28/20 1526 10/30/20 1247 11/01/20 1745  NA 135 132* 134*  K 3.6 4.3 3.4*  CL 100 96* 99  CO2 '27 27 26  '$ GLUCOSE 191* 258* 75  BUN 18 22* 7  CREATININE 4.34* 4.97* 2.42*  CALCIUM 8.8* 9.0 8.9  PHOS 3.6 5.2* 2.2*    CBC: Recent Labs  Lab 10/28/20 1526 10/30/20 1247 11/01/20 1745  WBC 4.4 5.2 5.6  HGB 10.8* 11.1* 12.6  HCT 35.3* 37.7 42.0  MCV 97.5 98.7 97.4  PLT 206 197 190    CBG: Recent Labs  Lab 11/02/20 0559 11/02/20 1205  11/02/20 1641 11/02/20 2109 11/03/20 0607  GLUCAP 138* 148* 250* 151* 137*    Brief HPI:   Hannah [name] is a 55 y.o. female with history of multiple strokes with residual right spastic HP, MS-on no treatment, T2DM with progressive nephropathy, multiple recent admissions for acute on chronic renal failure who was readmitted on 09/26/2020 with fluid overload, malaise and uremia.  She was started on IV diuresis with good results but continued to have uremic symptoms.  Aranesp was added due to significant anemia due to chronic disease.  Dialysis was recommended and after multiple discussions, patient was agreeable to have IJ placed on 05/16.  Hemodialysis initiated and RUE AV fistula was placed by Dr. Donzetta Matters on 05/19.  She did report low back and right flank/thigh pain with spasms and was found to have spontaneous right psoas hematoma on 05/26.  Dr. Donne Hazel was consulted for input and recommended holding Plavix for 2 weeks as well as monitoring H&H for stability.  She continued to be limited by pain with weakness affecting overall functional status.  CIR was recommended for follow-up therapy.   Hospital Course: Hannah [name] was admitted to rehab 10/18/2020 for inpatient therapies to consist of PT, ST and OT at least three hours five days a week. Past admission physiatrist, therapy team and rehab RN have worked together to provide customized collaborative inpatient rehab.  Her blood pressures were monitored on TID basis and initially she was noted to have issues with hypotension after dialysis.  Nephrology has been adjusting fluid status and blood pressures are better controlled at this time.  Patient and family have been educated on renal diet as well as fluid restrictions.  Pain has  been controlled with as needed use of oxycodone.  Constipation is managed with use of senna S twice daily with MiraLAX daily.  Nausea has resolved and Reglan was weaned off.    Tizanidine was decreased to 2 mg /HS to avoid side  effects due to renal failure.  She continued to have issues with spasticity and Dantrium was added after clearance from nephrology.  Dantrium has slowly been titrated up to 50 mg TID and she is tolerating this without side effects.    Her diabetes has been monitored with ac/hs CBG checks and SSI was use prn for tighter BS control.  Her Lantus has been adjusted to 12 units during the day with 5 units of NovoLog 3 times daily AC.  Glipizide remains on hold and family was advised to continue monitoring blood sugars AC at bedtime and follow-up with PCP prior to increasing insulin to home dose.  Follow-up labs shows hyponatremia to be relatively stable and H&H has improved.  Right upper extremity incision is healing well.  Pain has been relatively controlled and she is being weaned off oxycodone.  She has made steady gains during her stay and currently requires supervision.  She will continue to receive follow-up outpatient PT and OT at [facility] neuro rehab after discharge   Rehab course: During patient's stay in rehab weekly team conferences were held to monitor patient's progress, set goals and discuss barriers to discharge. At admission, patient required min assist with mobility and min to mod assist with ADL tasks. She  has had improvement in activity tolerance, balance, postural control as well as ability to compensate for deficits.  She is able to complete ADL tasks with supervision.  She requires contact-guard assist for transfers and to ambulate 150 feet with rolling walker.  Family education was completed with family.    Disposition: home.   Diet: Renal/Carb modified. 1200 cc FR.   Special Instructions: Monitor blood sugars ac/hs and follow-up with PCP for further adjustment in insulin.  Discharge Instructions     Ambulatory referral to Occupational Therapy   Complete by: As directed    Eval and treat   Ambulatory referral to Physical Medicine Rehab   Complete by: As directed    3-4 week follow  up appt   Ambulatory referral to Physical Therapy   Complete by: As directed    Eval and treat      Allergies as of 11/03/2020       Reactions   Ibuprofen Other (See Comments), Swelling   Edema Swelling to hands, feet and face   Influenza Vaccines Anaphylaxis   Swelling, fever   Pneumococcal Vaccine Anaphylaxis   Swelling, fever Swelling, fever   Promethazine Other (See Comments)   hallucinations hallucinations   Ambien [zolpidem]    Other reaction(s): Unknown   Cyclobenzaprine Other (See Comments)   hallucinations   Tape Itching   Tramadol Hcl    Other reaction(s): hallucinations   Oxycodone Nausea Only, Nausea And Vomiting        Medication List     STOP taking these medications    acetaminophen 325 MG tablet Commonly known as: TYLENOL   ergocalciferol 1.25 MG (50000 UT) capsule Commonly known as: VITAMIN D2   FLUoxetine 20 MG capsule Commonly known as: PROZAC   glipiZIDE 10 MG tablet Commonly known as: GLUCOTROL   sodium bicarbonate 650 MG tablet       TAKE these medications    amLODipine 10 MG tablet Commonly known as: NORVASC Take 1  tablet (10 mg total) by mouth daily.   carvedilol 25 MG tablet Commonly known as: COREG Take 1 tablet (25 mg total) by mouth 2 (two) times daily with a meal. What changed: how much to take   clopidogrel 75 MG tablet Commonly known as: PLAVIX Take 1 tablet (75 mg total) by mouth daily.   dantrolene 50 MG capsule Commonly known as: DANTRIUM Take 1 capsule (50 mg total) by mouth 3 (three) times daily.   diclofenac Sodium 1 % Gel Commonly known as: VOLTAREN Apply 2 g topically 4 (four) times daily. To leg What changed: additional instructions   famotidine 20 MG tablet Commonly known as: PEPCID Take 1 tablet (20 mg total) by mouth daily.   gabapentin 100 MG capsule Commonly known as: NEURONTIN Take 1 capsule (100 mg total) by mouth at bedtime.   hydrALAZINE 50 MG tablet Commonly known as:  APRESOLINE Take 1 tablet (50 mg total) by mouth every 8 (eight) hours.   insulin lispro 100 UNIT/ML KwikPen Junior Generic drug: insulin lispro Inject 5 Units into the skin 3 (three) times daily. What changed: how much to take   melatonin 3 MG Tabs tablet Take 1 tablet (3 mg total) by mouth at bedtime. Notes to patient: Over the counter   nitroGLYCERIN 0.4 MG SL tablet Commonly known as: NITROSTAT Place 1 tablet (0.4 mg total) under the tongue every 5 (five) minutes as needed for chest pain.   oxyCODONE-acetaminophen 5-325 MG tablet--Rx#  5pills  Commonly known as: PERCOCET/ROXICET Take 1 tablet by mouth daily as needed for severe pain.   polyethylene glycol 17 g packet Commonly known as: MIRALAX / GLYCOLAX Take 17 g by mouth daily. Start taking on: November 04, 2020 Notes to patient: For constipation   rosuvastatin 20 MG tablet Commonly known as: CRESTOR Take 1 tablet (20 mg total) by mouth daily.   senna-docusate 8.6-50 MG tablet Commonly known as: Senokot-S Take 1 tablet by mouth 2 (two) times daily as needed for moderate constipation.   tiZANidine 2 MG tablet Commonly known as: ZANAFLEX Take 1 tablet (2 mg total) by mouth at bedtime. What changed: when to take this Notes to patient: Note dose has been decreased due to dialysis   Tresiba FlexTouch 100 UNIT/ML FlexTouch Pen Generic drug: insulin degludec Inject 12 Units into the skin at bedtime. What changed:  how much to take additional instructions        Follow-up Information     Lovorn, Jinny Blossom, MD Follow up.   Specialty: Physical Medicine and Rehabilitation Why: office will call you with follow up appointment Contact information: [id number] [place] [address] [phone]         Vernie Shanks, MD. Call in 1 day(s).   Specialty: Family Medicine Why: for post hospital follow up Contact information: [place] [address] [phone]         Waynetta Sandy, MD Follow up on 11/14/2020.   Specialties: Vascular Surgery, Cardiology Why: be there at 2 pm for follow up appointment Contact information: [place] [address] [phone]                 Signed: Bary Leriche 11/03/2020, 5:15 PM

## 2020-11-03 NOTE — Progress Notes (Signed)
Patient for discharge from CIR today. Navigator updated [facility] Admissions and patient's outpatient HD clinic[facility]. Renal PA sent orders over the weekend. Patient's daughter has instructions from recent conversation with Renal Navigator.  Hannah [name], [place] Renal Navigator [phone]

## 2020-11-03 NOTE — Progress Notes (Signed)
Patient ID: Hannah [name], female   DOB: [birth year], 54 y.o.   MRN: [id number]  This SW covering for primary [facility].  SW faxed [facility] (p:[phone]) outpatient referral for PT/OT/SLP.  Loralee Pacas, MSW, [place] Office: [phone] Cell: [phone] Fax: [phone]

## 2020-11-03 NOTE — Progress Notes (Signed)
Inpatient Rehabilitation Care Coordinator Discharge Note  The overall goal for the admission was met for:   This SW covering for primary SW, [name].  Discharge location: Yes. D/c to home.   Length of Stay: 15 days.   Discharge activity level: CGA to close supervision  Home/community participation: Limited.   Services provided included: MD, RD, PT, OT, RN, CM, TR, Pharmacy, Neuropsych, and SW  Financial Services: Private Insurance: [facility] Medicare  Choices offered to/list presented to:Yes  Follow-up services arranged: Outpatient: [facility] for outpatient PT/OT/SLP  Comments (or additional information):  Patient/Family verbalized understanding of follow-up arrangements: Yes  Individual responsible for coordination of the follow-up plan: contact pt [phone]  Confirmed correct DME delivered: Rana Snare 11/03/2020    Rana Snare

## 2020-11-03 NOTE — Progress Notes (Signed)
Occupational Therapy Discharge Summary  Patient Details  Name: Hannah [name] MRN: [id number] Date of Birth: [birth year]    Patient has met 6 of 7 long term goals due to improved activity tolerance, improved balance, postural control, ability to compensate for deficits, improved awareness, and improved coordination.  Patient to discharge at overall Supervision level.  Patient's care partner is independent to provide the necessary physical assistance at discharge.  Pt lives with daughter/family who will provide 24/7. Pt's daughters have been present for several family education sessions and are trained to provide Supervision during ADLs.   Reasons goals not met: Pt did not meet shower transfer goal, did not observe shower transfer during hospitalization. Not cleared to shower.   Recommendation:  Patient will benefit from ongoing skilled OT services in home health setting to continue to advance functional skills in the area of BADL and Reduce care partner burden.  Equipment: No equipment provided  Reasons for discharge: treatment goals met and discharge from hospital  Patient/family agrees with progress made and goals achieved: Yes  OT Discharge Precautions/Restrictions  Precautions Precautions: Fall Precaution Comments: HD access port RUQ, AV graft maturing in Rt UE, spastic Rt hemiplegia at baseline Restrictions Weight Bearing Restrictions: No Pain Pain Assessment Pain Scale: 0-10 Pain Score: 0-No pain ADL ADL Eating: Not assessed Grooming: Setup Where Assessed-Grooming: Edge of bed Upper Body Bathing: Supervision/safety Where Assessed-Upper Body Bathing: Edge of bed Lower Body Bathing: Supervision/safety Where Assessed-Lower Body Bathing: Edge of bed Upper Body Dressing: Supervision/safety Where Assessed-Upper Body Dressing: Edge of bed Lower Body Dressing: Supervision/safety Where Assessed-Lower Body Dressing: Edge of bed Toileting: Supervision/safety Toilet Transfer:  Close supervision Toilet Transfer Method: Human resources officer: Not assessed Vision Baseline Vision/History: No visual deficits Wears Glasses: Reading only Perception  Perception: Within Functional Limits Praxis Praxis: Intact Cognition Overall Cognitive Status: Within Functional Limits for tasks assessed Arousal/Alertness: Awake/alert Sensation Sensation Light Touch: Impaired Detail Light Touch Impaired Details: Impaired RLE Coordination Gross Motor Movements are Fluid and Coordinated: No Fine Motor Movements are Fluid and Coordinated: No Coordination and Movement Description: Affected by residual spastic Rt hemplegia Motor  Motor Motor: Hemiplegia;Abnormal tone;Abnormal postural alignment and control Mobility  Bed Mobility Bed Mobility: Rolling Left;Supine to Sit;Sit to Supine Rolling Left: Supervision/Verbal cueing Supine to Sit: Supervision/Verbal cueing Sit to Supine: Supervision/Verbal cueing Transfers Sit to Stand: Supervision/Verbal cueing Stand to Sit: Supervision/Verbal cueing  Trunk/Postural Assessment  Cervical Assessment Cervical Assessment: Exceptions to [facility] Thoracic Assessment Thoracic Assessment: Exceptions to [facility] Lumbar Assessment Lumbar Assessment: Exceptions to [facility] Postural Control Postural Control: Within Functional Limits  Balance Balance Balance Assessed: Yes Static Sitting Balance Static Sitting - Level of Assistance: 6: Modified independent (Device/Increase time) Dynamic Sitting Balance Dynamic Sitting - Balance Support: During functional activity Dynamic Sitting - Level of Assistance: 5: Stand by assistance Dynamic Sitting - Balance Activities: Lateral lean/weight shifting;Forward lean/weight shifting;Reaching for objects Static Standing Balance Static Standing - Balance Support: Left upper extremity supported;During functional activity Static Standing - Level of Assistance: 5: Stand by assistance Dynamic Standing  Balance Dynamic Standing - Balance Support: During functional activity;No upper extremity supported Dynamic Standing - Level of Assistance: 5: Stand by assistance Dynamic Standing - Balance Activities: Lateral lean/weight shifting;Forward lean/weight shifting;Reaching for objects Extremity/Trunk Assessment RUE Assessment RUE Assessment: Exceptions to [facility] General Strength Comments: spasticity at baseline due to CVA hx LUE Assessment LUE Assessment: Within Functional Limits   Viona Gilmore 11/03/2020, 4:44 PM

## 2020-11-03 NOTE — Progress Notes (Signed)
Patient discharged to home, accompanied by her daughter.

## 2020-11-03 NOTE — Patient Outreach (Signed)
Hannah [name]) Care Management  11/03/2020  Hannah [name] Dec 26, 1965 [id number]  This encounter was created in error - please disregard.  Joylene Draft, RN, BSN  [place] Management Coordinator  [phone]- Mobile [phone]- Toll Free Main Office

## 2020-11-04 ENCOUNTER — Other Ambulatory Visit: Payer: Self-pay | Admitting: *Deleted

## 2020-11-04 ENCOUNTER — Telehealth: Payer: Self-pay | Admitting: *Deleted

## 2020-11-04 ENCOUNTER — Telehealth: Payer: Self-pay

## 2020-11-04 ENCOUNTER — Telehealth: Payer: Self-pay | Admitting: Nephrology

## 2020-11-04 MED ORDER — INSULIN LISPRO (1 UNIT DIAL) 100 UNIT/ML (KWIKPEN)
5.0000 [IU] | PEN_INJECTOR | Freq: Three times a day (TID) | SUBCUTANEOUS | 0 refills | Status: AC
Start: 2020-11-04 — End: ?

## 2020-11-04 NOTE — Patient Outreach (Signed)
[facility]) Care Management  11/04/2020  Hannah [name] [birth year] [id number]   Referral Date : 620/22 Referral source: [facility] hospital liasion  Referral reason : Complex care and disease management follow up call, assess for further needs.  Insurance: [employer]  Inpatient admission at [facility] 5/13-10/18/20 for volume overload, acute on chronic renal failure, Hemodialysis initiated , RUE fistula placed, spontaneous right psoas hematoma. Inpatient CIR 6/4-6/20/22 , Debility, history of stroke, spastic hemaplegia.  PMHx; Diabetes type 2 CVA , hypertension    Subjective: Successful outreach call to patient preferred contact number, spoke with patient daughter Hannah [name], designated party release. Discussed prior outreach to patient recent hospital admission. Hannah [name] states patient is at hemodialysis on today, she now has a Tuesday, Thursday Saturday scheduled. She states that she took patient on this first day, but they have transportation services in place with [employer] for future appointments.  She discussed patient adjusting to new plan with hemodialysis . Hannah [name] discussed that she has been receiving several calls for follow up on today as well as scheduling future appointments.  Patient has her initial outpatient therapy scheduled for 6/22.    Social Patient lives at home with her daughter Hannah [name], her other Hannah [name] lives in the same apartment complex and stays with patient when Hannah [name] is at work.  Patient has in place [employer] transportation services, when family is not providing transportation. Family able to assist with personal care, assist with mobility in the home using a rolling walker. Daughter reports having needed equipment in home.  Conditions  Patient with history of previous strokes, spastic hemiplegia, using a rolling walker. Patient with ESRD, with hemodialysis on [facility].  She has history of Diabetes , using freestyle libre patient able to monitor  readings. Daughter denies hypoglycemia episodes since discharge understands how to manage and when to notify MD of concerns.    Medications  Daughter assist patient with medication management using a pill organizer. Daughter has placed call regarding clarification on humalog insulin kwik  pen and plans to pick up at pharmacy on today . Daughter is currently not at home unable review medication list at this time   Consent  Discussed and reviewed [facility] care management services and prior involvement prior to admission , daughter is agreeable to continued support from [facility] care management services.   Plan Will plan return call in the next per daughter recommended outreach day to follow up with patient.    Patient Care Plan: General Plan of Care (Adult)     Problem Identified: Health Promotion or Disease Self-Management (General Plan of Care) Resolved 11/04/2020  Priority: Medium  Onset Date: 09/23/2020     Goal: Self-Management Plan Developed as evidenced by no hospital readmission related to acute on chronic kidney disease over the next 30 days Completed 11/04/2020  Start Date: 09/23/2020  Priority: Medium  Note:   Evidence-based guidance:  Review biopsychosocial determinants of health screens.  Determine level of modifiable health risk.  Assess level of patient activation, level of readiness, importance and confidence to make changes.  Identify areas where behavior change may lead to improved health.  Partner with patient to develop a robust self-management plan that includes lifestyle factors, such as weight loss, exercise and healthy nutrition, as well as goals specific to disease risks.  Support patient and family/caregiver active participation in decision-making and self-management plan.  IReview need for preventive screening based on age, sex, family history and health history.   Barriers: Health Behaviors Knowledge  Notes:  patient admitted, care plan not appropriate at this time      Task: Mutually Develop and [place] of Patient Goals Completed 11/04/2020  Due Date: 11/13/2020  Priority: Routine  Note:   Care Management Activities:    - health risks reviewed - reassurance provided   Barriers: Knowledge    Notes:  Review signs symptoms of dehydration, reinforce nutrition hydration importance . Discussed offering foods/drinks frequently.     Patient Care Plan: Diabetes Type 2 (Adult)     Problem Identified: Disease Progression (Diabetes, Type 2)   Priority: Medium  Onset Date: 09/23/2020     Long-Range Goal: Disease Progression Prevented or Minimized as evidenced by report of A1c within goal of less than 8  over the next 90 days   Start Date: 09/23/2020  Expected End Date: 01/14/2021  Priority: Medium  Note:   Evidence-based guidance:  Individualize activity and exercise recommendations while considering potential limitations, such as neuropathy, retinopathy or the ability to prevent hyperglycemia or hypoglycemia.   Assess signs/symptoms and risk factors for hypertension, sleep-disordered breathing, neuropathy (including changes in gait and balance), retinopathy, nephropathy and sexual dysfunction.  Ensure completion of annual comprehensive foot exam and dilated eye exam.   Implement additional individualized goals and interventions based on identified risk factors.  Prepare patient for consultation or referral for specialist care, such as ophthalmology, neurology, cardiology, podiatry, nephrology or perinatology.   Notes: Patient inpatient admission greater than 10 days , plan not appropriate at this time     Task: Monitor and Manage Follow-up for Comorbidities   Due Date: 01/14/2021  Note:   Care Management Activities:    - quality of sleep assessed - signs/symptoms of comorbidities identified    Notes:  Will continue assessment of comorbidities .      Patient Care Plan: Chronic Kidney (Adult)     Problem Identified: Disease  Progression      Goal: Disease Progression Prevented or Minimized as evidenced by no hospital readmission over the next 30 days   Start Date: 11/04/2020  Expected End Date: 12/13/2020  Note:   Evidence-based guidance:  Discuss potential for disease progression based on clinical diagnosis and/or causation and other risk factors including race, ethnicity, age, comorbidities, lifestyle.  Encourage lifestyle changes including maintaining a healthy weight, exercise and activity, limiting alcohol consumption, smoking cessation to improve long-term outcomes.  Acknowledge difficulty in making life-long behavior change; provide guidance to incorporate lifestyle change into daily life and social activities; provide emotional support, coaching and praise for success.  Anticipate the use of pharmacologic therapy to prevent disease progression and manage complications or comorbidities, such as hypertension, cardiovascular disease, diabetes, anemia, bone disorders, peripheral vascular disease.  Monitor for nephrotoxic medication or supplement use including NSAIDs (nonsteroidal anti-inflammatory drugs), radio-contrast media, oral phosphate-containing bowel preparations, herbal supplements, protein supplements.    Address barriers to treatment adherence, such as cognitive function, illness, drug interaction, medication cost, presence of depression or anxiety.  Facilitate coordination between nephrologist and primary care provider to ensure continuation of care for co-morbidities and routine preventive care.   Notes: Discussed follow up hemodialysis plan,give blood pressure medications after hemodialysis treatment as recommended discussed rationale.    Task: Alleviate Barriers to Chronic Kidney Disease Treatment   Due Date: 01/13/2021  Priority: Routine  Note:   Care Management Activities:    - activity or exercise based on tolerance encouraged - self-awareness of signs/symptoms of worsening disease encouraged     Notes:  Discussed current treatment days and transportation  plan        Joylene Draft, RN, BSN  [place] Management Coordinator  [phone]- Mobile [phone]- Toll Free Main Office    Joylene Draft, [place], BSN  [place] Management Coordinator  [phone]- Mobile [phone]- Toll Free Main Office

## 2020-11-04 NOTE — Plan of Care (Signed)
  Problem: RH Balance Goal: LTG Patient will maintain dynamic standing balance (PT) Description: LTG:  Patient will maintain dynamic standing balance with assistance during mobility activities (PT) Outcome: Completed/Met Flowsheets (Taken 11/04/2020 1216) LTG: Pt will maintain dynamic standing balance during mobility activities with:: Supervision/Verbal cueing   Problem: RH Bed Mobility Goal: LTG Patient will perform bed mobility with assist (PT) Description: LTG: Patient will perform bed mobility with assistance, with/without cues (PT). Outcome: Completed/Met Flowsheets (Taken 11/04/2020 1227) LTG: Pt will perform bed mobility with assistance level of: Independent with assistive device    Problem: RH Bed to Chair Transfers Goal: LTG Patient will perform bed/chair transfers w/assist (PT) Description: LTG: Patient will perform bed to chair transfers with assistance (PT). Outcome: Completed/Met Flowsheets (Taken 11/04/2020 1227) LTG: Pt will perform Bed to Chair Transfers with assistance level: Supervision/Verbal cueing   Problem: RH Car Transfers Goal: LTG Patient will perform car transfers with assist (PT) Description: LTG: Patient will perform car transfers with assistance (PT). Outcome: Completed/Met Flowsheets (Taken 10/19/2020 1232 by Canary Brim B, PT) LTG: Pt will perform car transfers with assist:: Contact Guard/Touching assist   Problem: RH Ambulation Goal: LTG Patient will ambulate in controlled environment (PT) Description: LTG: Patient will ambulate in a controlled environment, # of feet with assistance (PT). Outcome: Completed/Met Flowsheets (Taken 10/19/2020 1232 by Canary Brim B, PT) LTG: Pt will ambulate in controlled environ  assist needed:: Contact Guard/Touching assist Goal: LTG Patient will ambulate in home environment (PT) Description: LTG: Patient will ambulate in home environment, # of feet with assistance (PT). Outcome: Completed/Met Flowsheets (Taken 10/19/2020  1232 by Lars Masson, PT) LTG: Pt will ambulate in home environ  assist needed:: Contact Guard/Touching assist   Problem: RH Stairs Goal: LTG Patient will ambulate up and down stairs w/assist (PT) Description: LTG: Patient will ambulate up and down # of stairs with assistance (PT) Outcome: Completed/Met Flowsheets (Taken 10/19/2020 1232 by Canary Brim B, PT) LTG: Pt will ambulate up/down stairs assist needed:: Contact Guard/Touching assist LTG: Pt will  ambulate up and down number of stairs: 1 curb step for home entry

## 2020-11-04 NOTE — Telephone Encounter (Signed)
Order for Insulin lispro kwik pen to be changed to 100 unit/ml kwik pen and not [employer], per [employer] PA. New order sent to pharmacy.

## 2020-11-04 NOTE — Telephone Encounter (Signed)
Transition Care Management Unsuccessful Follow-up Telephone Call  Date of discharge and from where:  10/18/2020 [facility]  Attempts:  1st Attempt  Reason for unsuccessful TCM follow-up call:  Unable to reach patient

## 2020-11-04 NOTE — Telephone Encounter (Signed)
Transition of Care Contact from [place]  Date of Discharge: 11/03/2020 Date of Contact: 11/04/2020 Method of contact: phone - attempted, No Answer  Attempted to contact patient to discuss transition of care from inpatient admission.  Patient did not answer the phone.  Message was left on patient's voicemail informing them we would attempt to call them again and if unable to reach will follow up at dialysis.  Jen Mow, PA-C [facility] Pager: [phone]

## 2020-11-05 ENCOUNTER — Ambulatory Visit: Payer: Self-pay | Admitting: Physical Therapy

## 2020-11-05 ENCOUNTER — Encounter: Payer: Self-pay | Admitting: Occupational Therapy

## 2020-11-10 ENCOUNTER — Other Ambulatory Visit: Payer: Self-pay | Admitting: *Deleted

## 2020-11-10 NOTE — Patient Outreach (Signed)
[facility]) Care Management  [place]  11/10/2020   Hannah [name] November 03, 1965 [id number]  Telephone Assessment   Referral Date : 620/22 Referral source: [facility] hospital liasion Referral reason : Complex care and disease management follow up call, assess for further needs. Insurance: [employer]   Inpatient admission at [facility] 5/13-10/18/20 for volume overload, acute on chronic renal failure, Hemodialysis initiated , RUE fistula placed, spontaneous right psoas hematoma. Inpatient CIR 6/4-6/20/22 , Debility, history of stroke, spastic hemaplegia. PMHx; Diabetes type 2 CVA , hypertension  Subjective: Successful outreach call to patient daughter, [name] and patient.  Patient reports that she is feeling much better than the previous time I spoke with her prior to recent hospital admission. She discussed feeling better since starting hemodialysis.   Patient daughter discussed initial outpatient physical therapy appointment rescheduled, due to schedule conflict.  Patient prefers outpatient therapy and agrees to attending. Patient daughter [name]  expressed interest in home health therapy instead of outpatient services she plans to speak with patient as well and other family members, discussed with her since outpatient services ordered if patient agrees would need MD order for services, she voiced understanding. [name] discussed her sister  [name] will be staying with patient next week while she is out of country.   Objective:   Encounter Medications:  Outpatient Encounter Medications as of 11/10/2020  Medication Sig   amLODipine (NORVASC) 10 MG tablet Take 1 tablet (10 mg total) by mouth daily.   carvedilol (COREG) 25 MG tablet Take 1 tablet (25 mg total) by mouth 2 (two) times daily with a meal.   clopidogrel (PLAVIX) 75 MG tablet Take 1 tablet (75 mg total) by mouth daily.   dantrolene (DANTRIUM) 50 MG capsule Take 1 capsule (50 mg total) by mouth 3  (three) times daily.   diclofenac Sodium (VOLTAREN) 1 % GEL Apply 2 g topically 4 (four) times daily. To leg   famotidine (PEPCID) 20 MG tablet Take 1 tablet (20 mg total) by mouth daily.   gabapentin (NEURONTIN) 100 MG capsule Take 1 capsule (100 mg total) by mouth at bedtime.   hydrALAZINE (APRESOLINE) 50 MG tablet Take 1 tablet (50 mg total) by mouth every 8 (eight) hours.   insulin degludec (TRESIBA FLEXTOUCH) 100 UNIT/ML FlexTouch Pen Inject 12 Units into the skin at bedtime.   insulin lispro (HUMALOG) 100 UNIT/ML KwikPen Inject 5 Units into the skin 3 (three) times daily.   melatonin 3 MG TABS tablet Take 1 tablet (3 mg total) by mouth at bedtime.   nitroGLYCERIN (NITROSTAT) 0.4 MG SL tablet Place 1 tablet (0.4 mg total) under the tongue every 5 (five) minutes as needed for chest pain.   oxyCODONE-acetaminophen (PERCOCET/ROXICET) 5-325 MG tablet Take 1 tablet by mouth daily as needed for severe pain.   polyethylene glycol (MIRALAX / GLYCOLAX) 17 g packet Take 17 g by mouth daily.   rosuvastatin (CRESTOR) 20 MG tablet Take 1 tablet (20 mg total) by mouth daily.   senna-docusate (SENOKOT-S) 8.6-50 MG tablet Take 1 tablet by mouth 2 (two) times daily as needed for moderate constipation.   tiZANidine (ZANAFLEX) 2 MG tablet Take 1 tablet (2 mg total) by mouth at bedtime.   No facility-administered encounter medications on file as of 11/10/2020.    Functional Status:  In your present state of health, do you have any difficulty performing the following activities: 10/18/2020 09/28/2020  Hearing? N -  Vision? Y -  Difficulty concentrating or making decisions? N -  Walking or climbing stairs? Y -  Dressing or bathing? Y -  Doing errands, shopping? N N  Some recent data might be hidden    Fall/Depression Screening: No flowsheet data found. PHQ 2/9 Scores 11/10/2020  PHQ - 2 Score 1   SDOH Screenings   Alcohol Screen: Not on file  Depression (PHQ2-9): Low Risk    PHQ-2 Score: 1   Financial Resource Strain: Not on file  Food Insecurity: No Food Insecurity   Worried About Charity fundraiser in the Last Year: Never true   Ran Out of Food in the Last Year: Never true  Housing: Low Risk    Last Housing Risk Score: 0  Physical Activity: Not on file  Social Connections: Not on file  Stress: Not on file  Tobacco Use: Low Risk    Smoking Tobacco Use: Never   Smokeless Tobacco Use: Never  Transportation Needs: No Transportation Needs   Lack of Transportation (Medical): No   Lack of Transportation (Non-Medical): No     Assessment:  Patient will benefit from ongoing complex care management following recent hospitalization and rehab stay.    Care Plan Care Plan : Diabetes Type 2 (Adult)  Updates made by Alfonzo Feller, RN since 11/10/2020 12:00 AM     Problem: Disease Progression (Diabetes, Type 2)   Priority: Medium  Onset Date: 09/23/2020     Long-Range Goal: Disease Progression Prevented or Minimized as evidenced by report of A1c within goal of less than 8  over the next 90 days   Start Date: 11/04/2020  Expected End Date: 01/14/2021  Priority: Medium  Note:   Evidence-based guidance:  Individualize activity and exercise recommendations while considering potential limitations, such as neuropathy, retinopathy or the ability to prevent hyperglycemia or hypoglycemia.   Assess signs/symptoms and risk factors for hypertension, sleep-disordered breathing, neuropathy (including changes in gait and balance), retinopathy, nephropathy and sexual dysfunction.  Ensure completion of annual comprehensive foot exam and dilated eye exam.   Implement additional individualized goals and interventions based on identified risk factors.  Prepare patient for consultation or referral for specialist care, such as ophthalmology, neurology, cardiology, podiatry, nephrology or perinatology.   Notes: Discussed use of freestyle libre use for monitoring blood sugars, taking insulin as  prescribed.     Task: Monitor and Manage Follow-up for Comorbidities   Due Date: 01/14/2021  Note:   Care Management Activities:    - quality of sleep assessed - signs/symptoms of comorbidities identified    Notes:  Resting well at night,encouraged follow up on  routine eye exam as recommended      Care Plan : Chronic Kidney (Adult)  Updates made by Alfonzo Feller, RN since 11/10/2020 12:00 AM     Problem: Disease Progression      Goal: Disease Progression Prevented or Minimized as evidenced by no hospital readmission over the next 30 days   Start Date: 11/04/2020  Expected End Date: 12/13/2020  Note:   Evidence-based guidance:  Discuss potential for disease progression based on clinical diagnosis and/or causation and other risk factors including race, ethnicity, age, comorbidities, lifestyle.  Encourage lifestyle changes including maintaining a healthy weight, exercise and activity, limiting alcohol consumption, smoking cessation to improve long-term outcomes.  Acknowledge difficulty in making life-long behavior change; provide guidance to incorporate lifestyle change into daily life and social activities; provide emotional support, coaching and praise for success.  Anticipate the use of pharmacologic therapy to prevent disease progression and manage complications or comorbidities, such  as hypertension, cardiovascular disease, diabetes, anemia, bone disorders, peripheral vascular disease.  Monitor for nephrotoxic medication or supplement use including NSAIDs (nonsteroidal anti-inflammatory drugs), radio-contrast media, oral phosphate-containing bowel preparations, herbal supplements, protein supplements.    Address barriers to treatment adherence, such as cognitive function, illness, drug interaction, medication cost, presence of depression or anxiety.  Facilitate coordination between nephrologist and primary care provider to ensure continuation of care for co-morbidities and  routine preventive care.   Notes: Reviewed recent blood pressure readings, 143/70, reinforced adherence to follow up outpatient rehab to assist with progress with strength and balance    Task: Alleviate Barriers to Chronic Kidney Disease Treatment   Due Date: 01/13/2021  Priority: Routine  Note:   Care Management Activities:    - activity or exercise based on tolerance encouraged - self-awareness of signs/symptoms of worsening disease encouraged    Notes:  Cancelled outpatient PT appointment due to scheduling conflict , patient prefers outpatient therapy to home health. She is agreeable to attending sessions.       Goals Addressed             This Visit's Progress    Follow My Treatment Plan-Chronic Kidney   On track    Timeframe:  Long-Range Goal Priority:  Medium Start Date:    11/04/20                         Expected End Date:  01/13/21                     Follow Up Date 12/13/20   - keep follow-up appointments - keep taking my medicines, even when I feel good    Why is this important?   Staying as healthy as you can is very important. This may mean making changes if you smoke, don't exercise or eat poorly.  A healthy lifestyle is an important goal for you.  Following the treatment plan and making changes may be hard.  Try some of these steps to help keep the disease from getting worse.     Notes: Attending Hemodialysis treatments as scheduled.       Improve My Nutrition with the Foods I Eat and Drink   On track    Timeframe:  Long-Range Goal Priority:  Medium Start Date:    11/04/20                        Expected End Date:11/13/20                     Follow Up Date 11/27/20   - choose small, frequent meals - include protein food, like egg, meat, chicken, peanut butter, beans and fish in each meal    Why is this important?   Eating 3 healthy meals, or 5 or 6 small meals, a day is very important.  A healthy diet is one that has fruit, vegetables, protein, good  fats, whole grains and low-fat dairy products.  Be sure to have plenty of protein that builds strong muscles. Many older people do not get quite enough protein.     Barriers: Knowledge  Notes: Patient acknowledges improved appetite and intake, fluid restriction limits.       Make and Keep All Appointments   On track    Timeframe:  Long-Range Goal Priority:  Medium Start Date:  11/04/20  Expected End Date:  01/13/21                     Follow Up Date : 11/27/20   - keep a calendar with appointment dates    Why is this important?   Part of staying healthy is seeing the doctor for follow-up care.  If you forget your appointments, there are some things you can do to stay on track.  Barriers: None  Notes:  Reviewed upcoming appointment with Vascular clinic provided contact number and date/time of appointment . Discussed transportation with access [employer] or family member available. Daughter to schedule post dc visit with PCP .       Monitor and Manage My Blood Sugar-Diabetes Type 2   On track    Timeframe:  Long-Range Goal Priority:  Medium Start Date:   11/04/20                          Expected End Date: 01/13/21                   Follow Up Date 11/27/20   - check blood sugar at prescribed times - check blood sugar if I feel it is too high or too low - take the blood sugar log to all doctor visits - take the blood sugar meter to all doctor visits    Why is this important?   Checking your blood sugar at home helps to keep it from getting very high or very low.  Writing the results in a diary or log helps the doctor know how to care for you.  Your blood sugar log should have the time, date and the results.  Also, write down the amount of insulin or other medicine that you take.  Other information, like what you ate, exercise done and how you were feeling, will also be helpful.     Notes: Goal restart : no recent hypoglycemia episodes, checking readings at least  4 times daily with freestyle libre and verifies with finger sticks. Readings in 120-170.      Set My Target A1C-Diabetes Type 2   On track    Timeframe:  Long-Range Goal Priority:  Medium Start Date:  11/04/20                           Expected End Date:  01/13/21                    Follow Up Date 11/27/20   - set target A1C    Why is this important?   Your target A1C is decided together by you and your doctor.  It is based on several things like your age and other health issues.   Barriers: Knowledge  Goal restart  Note Reviewed goal A1c and her recent A1c, encouraged to discuss with provider at visit            Plan:  Follow-up: Patient agrees to Care Plan and Follow-up. Will plan follow up call in the next 2 weeks. Will send PCP visit note  Will send patient after visit summary. Reinforced with patient /daughter  to contact CM if new concern arise sooner    Joylene Draft, RN, BSN  [place] Management Coordinator  [phone]- Mobile [phone]- [place]

## 2020-11-12 ENCOUNTER — Ambulatory Visit: Payer: Medicare Other | Admitting: *Deleted

## 2020-11-13 DIAGNOSIS — I252 Old myocardial infarction: Secondary | ICD-10-CM | POA: Insufficient documentation

## 2020-11-14 ENCOUNTER — Other Ambulatory Visit: Payer: Self-pay

## 2020-11-14 ENCOUNTER — Ambulatory Visit (HOSPITAL_COMMUNITY)
Admission: RE | Admit: 2020-11-14 | Discharge: 2020-11-14 | Disposition: A | Discharge status: Home / Self Care | Payer: Medicare Other | Source: Ambulatory Visit | Attending: Vascular Surgery | Admitting: Vascular Surgery

## 2020-11-14 ENCOUNTER — Ambulatory Visit (INDEPENDENT_AMBULATORY_CARE_PROVIDER_SITE_OTHER): Payer: Medicare Other | Admitting: Physician Assistant

## 2020-11-14 VITALS — BP 144/69 | HR 90 | Temp 98.1°F | Resp 20 | Ht 62.0 in

## 2020-11-14 DIAGNOSIS — N185 Chronic kidney disease, stage 5: Secondary | ICD-10-CM | POA: Diagnosis not present

## 2020-11-14 DIAGNOSIS — Z992 Dependence on renal dialysis: Secondary | ICD-10-CM

## 2020-11-14 DIAGNOSIS — N186 End stage renal disease: Secondary | ICD-10-CM

## 2020-11-14 NOTE — Progress Notes (Signed)
POST OPERATIVE OFFICE NOTE    CC:  F/u for surgery  HPI:  This is a 55 y.o. female who is s/p right  brachial artery to basilic vein AV fistula on 10/02/20 by Dr. Donzetta [name].    Pt returns today for follow up.  Pt states she has no new symptoms of increased pain, weakness or loss of further sensation.  She has a history of stroke with right sided weakness and uses a WC for mobility.  She is on HD TTS via [place] placed by [facility].    Allergies  Allergen Reactions   Ibuprofen Other (See Comments) and Swelling    Edema Swelling to hands, feet and face    Influenza Vaccines Anaphylaxis    Swelling, fever   Pneumococcal Vaccine Anaphylaxis    Swelling, fever Swelling, fever    Promethazine Other (See Comments)    hallucinations hallucinations    Ambien [Zolpidem]     Other reaction(s): Unknown   Cyclobenzaprine Other (See Comments)    hallucinations    Tape Itching   Tramadol Hcl     Other reaction(s): hallucinations   Oxycodone Nausea Only and Nausea And Vomiting    Current Outpatient Medications  Medication Sig Dispense Refill   amLODipine (NORVASC) 10 MG tablet Take 1 tablet (10 mg total) by mouth daily. 30 tablet 0   carvedilol (COREG) 25 MG tablet Take 1 tablet (25 mg total) by mouth 2 (two) times daily with a meal. 60 tablet 0   clopidogrel (PLAVIX) 75 MG tablet Take 1 tablet (75 mg total) by mouth daily. 30 tablet 0   dantrolene (DANTRIUM) 50 MG capsule Take 1 capsule (50 mg total) by mouth 3 (three) times daily. 90 capsule 0   diclofenac Sodium (VOLTAREN) 1 % GEL Apply 2 g topically 4 (four) times daily. To leg 350 g 0   famotidine (PEPCID) 20 MG tablet Take 1 tablet (20 mg total) by mouth daily. 30 tablet 0   gabapentin (NEURONTIN) 100 MG capsule Take 1 capsule (100 mg total) by mouth at bedtime. 30 capsule 0   hydrALAZINE (APRESOLINE) 50 MG tablet Take 1 tablet (50 mg total) by mouth every 8 (eight) hours. 90 tablet 0   insulin degludec (TRESIBA FLEXTOUCH) 100 UNIT/ML  FlexTouch Pen Inject 12 Units into the skin at bedtime. 15 mL 0   insulin lispro (HUMALOG) 100 UNIT/ML KwikPen Inject 5 Units into the skin 3 (three) times daily. 15 mL 0   melatonin 3 MG TABS tablet Take 1 tablet (3 mg total) by mouth at bedtime. 30 tablet 0   nitroGLYCERIN (NITROSTAT) 0.4 MG SL tablet Place 1 tablet (0.4 mg total) under the tongue every 5 (five) minutes as needed for chest pain. 30 tablet 12   oxyCODONE-acetaminophen (PERCOCET/ROXICET) 5-325 MG tablet Take 1 tablet by mouth daily as needed for severe pain. 5 tablet 0   polyethylene glycol (MIRALAX / GLYCOLAX) 17 g packet Take 17 g by mouth daily. 30 each 0   rosuvastatin (CRESTOR) 20 MG tablet Take 1 tablet (20 mg total) by mouth daily. 30 tablet 0   senna-docusate (SENOKOT-S) 8.6-50 MG tablet Take 1 tablet by mouth 2 (two) times daily as needed for moderate constipation. 60 tablet 0   tiZANidine (ZANAFLEX) 2 MG tablet Take 1 tablet (2 mg total) by mouth at bedtime. 30 tablet 0   No current facility-administered medications for this visit.     ROS:  See HPI  Physical Exam:    Findings:  +--------------------+----------+-----------------+--------+  AVF  PSV (cm/s)Flow Vol (mL/min)Comments  +--------------------+----------+-----------------+--------+  Native artery inflow   171           748                 +--------------------+----------+-----------------+--------+  AVF Anastomosis        329                               +--------------------+----------+-----------------+--------+      +------------+----------+-------------+----------+--------------+  OUTFLOW VEINPSV (cm/s)Diameter (cm)Depth (cm)   Describe     +------------+----------+-------------+----------+--------------+  Prox UA        151        0.94        1.50                   +------------+----------+-------------+----------+--------------+  Mid UA         153        0.43        1.77                    +------------+----------+-------------+----------+--------------+  Dist UA        422        0.45        1.42   Retained valve  +------------+----------+-------------+----------+--------------+  AC Fossa       257        0.45        0.98                   +------------+----------+-------------+----------+--------------+          Summary:  Patent right BVT.  Elevated velocity at a retained valve in the distal upper arm, not  hemodynamically significant.  No thrombus seen.  No significant branches seen.       Incision:  Well healed  Extremities:  Palpable radial pulse and excellent palpable thrill in the fistula HENT: WNL, normocephalic Pulmonary: normal non-labored breathing Cardiac: regular, right IJ TDC in place, dressings c/d/i Abdomen: soft, NT/ND, no masses   Assessment/Plan:  This is a 55 y.o. female who is s/p:First stage right UE basilic vein fistula Without symptoms of steal.   The fistula is deep > 0.6 and is is maturing with a retained valve 4.5 mm in diameter.  I will schedule her for basilic transposition second stage.  Hopefully with branch ligation and transposition it will continue to mature and be usable.  If the fistula does not mature she will need a right AV graft.  Dr. Donzetta [name] will be out of town so we will schedule her with first available MD.   She is on Plavix.   Roxy Horseman PA-C [facility] [phone]   Clinic MD:  Hannah [name]

## 2020-11-19 ENCOUNTER — Other Ambulatory Visit: Payer: Self-pay | Admitting: *Deleted

## 2020-11-19 NOTE — Patient Outreach (Signed)
[facility]) Care Management  11/19/2020  Hannah [name] [birth year] [id number]   Telephone Assessment   Referral Date : 620/22 Referral source: [facility] hospital liasion Referral reason : Complex care and disease management follow up call, assess for further needs. Insurance: [employer]   Inpatient admission at [facility] 5/13-10/18/20 for volume overload, acute on chronic renal failure, Hemodialysis initiated , RUE fistula placed, spontaneous right psoas hematoma. Inpatient CIR 6/4-6/20/22 , Debility, history of stroke, spastic hemaplegia. PMHx; Diabetes type 2 CVA , hypertension  Subjective  Unsuccessful outreach call to patient no answer able to leave a HIPAA compliant voicemail message for return call.    Plan If no return call will plan outreach call in the next week.    Joylene Draft, RN, BSN  [place] Management Coordinator  [phone]- Mobile [phone]- Toll Free Main Office

## 2020-11-20 MED ORDER — PENTAFLUOROPROP-TETRAFLUOROETH EX AERO
1.0000 "application " | INHALATION_SPRAY | CUTANEOUS | Status: DC | PRN
  Filled 2020-11-20: qty 116

## 2020-11-20 MED ORDER — SODIUM CHLORIDE 0.9 % IV SOLN: 100.0000 mL | INTRAVENOUS | Status: DC | PRN

## 2020-11-20 MED ORDER — HEPARIN SODIUM (PORCINE) 1000 UNIT/ML DIALYSIS
2000.0000 [IU] | INTRAMUSCULAR | Status: DC | PRN
  Filled 2020-11-20: qty 2

## 2020-11-20 MED ORDER — HEPARIN SODIUM (PORCINE) 1000 UNIT/ML DIALYSIS
1000.0000 [IU] | INTRAMUSCULAR | Status: DC | PRN
  Filled 2020-11-20: qty 1

## 2020-11-20 MED ORDER — LIDOCAINE-PRILOCAINE 2.5-2.5 % EX CREA
1.0000 "application " | TOPICAL_CREAM | CUTANEOUS | Status: DC | PRN
  Filled 2020-11-20: qty 5

## 2020-11-20 MED ORDER — LIDOCAINE HCL (PF) 1 % IJ SOLN
5.0000 mL | INTRAMUSCULAR | Status: DC | PRN
  Filled 2020-11-20: qty 5

## 2020-11-20 MED ORDER — ALTEPLASE 2 MG IJ SOLR
2.0000 mg | Freq: Once | INTRAMUSCULAR | Status: DC | PRN
  Filled 2020-11-20: qty 2

## 2020-11-21 ENCOUNTER — Encounter (HOSPITAL_COMMUNITY): Payer: Self-pay | Admitting: Vascular Surgery

## 2020-11-21 NOTE — Progress Notes (Signed)
DUE TO COVID-19 ONLY ONE VISITOR IS ALLOWED TO COME WITH YOU AND STAY IN THE WAITING ROOM ONLY DURING PRE OP AND PROCEDURE DAY OF SURGERY.   PCP - Dr Ulanda Edison Cardiologist - n/a  Chest x-ray - 10/03/20 (1V) EKG - 11/01/20 Stress Test - n/a ECHO - 09/27/20 Cardiac Cath - n/a  Sleep Study -  n/a CPAP - none  Fasting Blood Sugar - 140s-150s Checks Blood Sugar 3 times a day  THE NIGHT BEFORE SURGERY, take 6 units Tresiba insulin.      THE MORNING OF SURGERY, do not take Humalog unless your CBG is greater than 220 mg/d.  If CBG is greater than 220, you may take  of your sliding scale (correction) dose of insulin.  If your blood sugar is less than 70 mg/dL, you will need to treat for low blood sugar: Treat a low blood sugar (less than 70 mg/dL) with  cup of clear juice (cranberry or apple), 4 glucose tablets, OR glucose gel. Recheck blood sugar in 15 minutes after treatment (to make sure it is greater than 70 mg/dL). If your blood sugar is not greater than 70 mg/dL on recheck, call [phone] for further instructions.  Blood Thinner Instructions:  Last dose of Plavix was on 11/17/20.  Anesthesia review: Yes  STOP now taking any Aspirin (unless otherwise instructed by your surgeon), Aleve, Naproxen, Ibuprofen, Motrin, Advil, Goody's, BC's, all herbal medications, fish oil, and all vitamins.   Coronavirus Screening Covid test n/a - Ambulatory Surgery  Do you have any of the following symptoms:  Cough yes/no: No Fever (>100.34F)  yes/no: No Runny nose yes/no: No Sore throat yes/no: No Difficulty breathing/shortness of breath  yes/no: No  Have you traveled in the last 14 days and where? yes/no: No  Patient verbalized understanding of instructions that were given via phone.

## 2020-11-23 ENCOUNTER — Encounter (HOSPITAL_COMMUNITY): Payer: Self-pay | Admitting: Vascular Surgery

## 2020-11-23 NOTE — Anesthesia Preprocedure Evaluation (Addendum)
Anesthesia Evaluation  Patient identified by MRN, date of birth, ID band Patient awake    Reviewed: Allergy & Precautions, NPO status , Patient's Chart, lab work & pertinent test results, reviewed documented beta blocker date and time   Airway Mallampati: III  TM Distance: >3 FB Neck ROM: Full    Dental  (+) Poor Dentition, Missing, Dental Advisory Given   Pulmonary neg pulmonary ROS,    Pulmonary exam normal breath sounds clear to auscultation       Cardiovascular hypertension, Pt. on medications and Pt. on home beta blockers + CAD and + Cardiac Stents  Normal cardiovascular exam Rhythm:Regular Rate:Normal  EKG 11/01/20 Sinus tachycardia, poor R wave progression in V leads, possible Anterior MI age indeterminant  Echo 09/27/20 1. Left ventricular ejection fraction, by estimation, is 70 to 75%. The left ventricle has hyperdynamic function. The left ventricle has no regional wall motion abnormalities. Left ventricular diastolic function could not be evaluated.  2. Right ventricular systolic function is normal. The right ventricular size is normal. There is mildly elevated pulmonary artery systolic pressure. The estimated right ventricular systolic pressure is 63.1 mmHg.  3. A small pericardial effusion is present. The pericardial effusion is circumferential. There is no evidence of cardiac tamponade.  4. The mitral valve is normal in structure. No evidence of mitral valve regurgitation. No evidence of mitral stenosis.  5. The aortic valve is tricuspid. Aortic valve regurgitation is not visualized. Mild aortic valve sclerosis is present, with no evidence of aortic valve stenosis.  6. The inferior vena cava is normal in size with greater than 50% respiratory variability, suggesting right atrial pressure of 3 mmHg.    Neuro/Psych Ambulates with a walker Weakness right arm and leg Dysarthria CVA, Residual Symptoms negative psych ROS    GI/Hepatic Neg liver ROS, GERD  Medicated and Controlled,  Endo/Other  diabetes, Poorly Controlled, Type 2, Oral Hypoglycemic Agents  Renal/GU ESRF and DialysisRenal diseaseLast dialysis 7/9  negative genitourinary   Musculoskeletal negative musculoskeletal ROS (+)   Abdominal   Peds  Hematology  (+) anemia , Plavix therapy - last dose 7/4   Anesthesia Other Findings   Reproductive/Obstetrics                            Anesthesia Physical Anesthesia Plan  ASA: 4  Anesthesia Plan: Regional and MAC   Post-op Pain Management:    Induction:   PONV Risk Score and Plan: 3 and Treatment may vary due to age or medical condition, Propofol infusion and Ondansetron  Airway Management Planned: Natural Airway and Simple Face Mask  Additional Equipment:   Intra-op Plan:   Post-operative Plan:   Informed Consent: I have reviewed the patients History and Physical, chart, labs and discussed the procedure including the risks, benefits and alternatives for the proposed anesthesia with the patient or authorized representative who has indicated his/her understanding and acceptance.     Dental advisory given  Plan Discussed with: CRNA and Anesthesiologist  Anesthesia Plan Comments:        Anesthesia Quick Evaluation

## 2020-11-24 ENCOUNTER — Ambulatory Visit (HOSPITAL_COMMUNITY): Payer: Medicare Other | Admitting: Physician Assistant

## 2020-11-24 ENCOUNTER — Telehealth: Payer: Self-pay

## 2020-11-24 ENCOUNTER — Other Ambulatory Visit: Payer: Self-pay

## 2020-11-24 ENCOUNTER — Ambulatory Visit (HOSPITAL_COMMUNITY)
Admission: RE | Admit: 2020-11-24 | Discharge: 2020-11-24 | Disposition: A | Discharge status: Home / Self Care | Payer: Medicare Other | Attending: Vascular Surgery | Admitting: Vascular Surgery

## 2020-11-24 ENCOUNTER — Encounter (HOSPITAL_COMMUNITY): Payer: Self-pay | Admitting: Vascular Surgery

## 2020-11-24 ENCOUNTER — Encounter (HOSPITAL_COMMUNITY)
Admission: RE | Disposition: A | Discharge status: Home / Self Care | Payer: Self-pay | Source: Home / Self Care | Attending: Vascular Surgery

## 2020-11-24 DIAGNOSIS — Z79899 Other long term (current) drug therapy: Secondary | ICD-10-CM | POA: Insufficient documentation

## 2020-11-24 DIAGNOSIS — Z992 Dependence on renal dialysis: Secondary | ICD-10-CM | POA: Insufficient documentation

## 2020-11-24 DIAGNOSIS — E1122 Type 2 diabetes mellitus with diabetic chronic kidney disease: Secondary | ICD-10-CM | POA: Diagnosis not present

## 2020-11-24 DIAGNOSIS — I120 Hypertensive chronic kidney disease with stage 5 chronic kidney disease or end stage renal disease: Secondary | ICD-10-CM | POA: Insufficient documentation

## 2020-11-24 DIAGNOSIS — E1165 Type 2 diabetes mellitus with hyperglycemia: Secondary | ICD-10-CM | POA: Insufficient documentation

## 2020-11-24 DIAGNOSIS — N186 End stage renal disease: Secondary | ICD-10-CM | POA: Diagnosis not present

## 2020-11-24 DIAGNOSIS — Z8673 Personal history of transient ischemic attack (TIA), and cerebral infarction without residual deficits: Secondary | ICD-10-CM | POA: Diagnosis not present

## 2020-11-24 DIAGNOSIS — Z7984 Long term (current) use of oral hypoglycemic drugs: Secondary | ICD-10-CM | POA: Diagnosis not present

## 2020-11-24 DIAGNOSIS — Z955 Presence of coronary angioplasty implant and graft: Secondary | ICD-10-CM | POA: Diagnosis not present

## 2020-11-24 HISTORY — PX: BASCILIC VEIN TRANSPOSITION: SHX5742

## 2020-11-24 HISTORY — DX: Chronic kidney disease, unspecified: N18.9

## 2020-11-24 HISTORY — DX: Dependence on other enabling machines and devices: Z99.89

## 2020-11-24 LAB — POCT I-STAT, CHEM 8
BUN: 27 mg/dL — ABNORMAL HIGH (ref 6–20)
Calcium, Ion: 1.22 mmol/L (ref 1.15–1.40)
Chloride: 97 mmol/L — ABNORMAL LOW (ref 98–111)
Creatinine, Ser: 3.7 mg/dL — ABNORMAL HIGH (ref 0.44–1.00)
Glucose, Bld: 236 mg/dL — ABNORMAL HIGH (ref 70–99)
HCT: 41 % (ref 36.0–46.0)
Hemoglobin: 13.9 g/dL (ref 12.0–15.0)
Potassium: 3.8 mmol/L (ref 3.5–5.1)
Sodium: 137 mmol/L (ref 135–145)
TCO2: 26 mmol/L (ref 22–32)

## 2020-11-24 LAB — GLUCOSE, CAPILLARY
Glucose-Capillary: 255 mg/dL — ABNORMAL HIGH (ref 70–99)
Glucose-Capillary: 270 mg/dL — ABNORMAL HIGH (ref 70–99)
Glucose-Capillary: 236 mg/dL — ABNORMAL HIGH (ref 70–99)

## 2020-11-24 SURGERY — TRANSPOSITION, VEIN, BASILIC
Anesthesia: Monitor Anesthesia Care | Site: Arm Upper | Laterality: Right

## 2020-11-24 MED ORDER — HEPARIN 6000 UNIT IRRIGATION SOLUTION
Status: DC | PRN
  Administered 2020-11-24: 1

## 2020-11-24 MED ORDER — MIDAZOLAM HCL 2 MG/2ML IJ SOLN
INTRAMUSCULAR | Status: AC
  Filled 2020-11-24: qty 2

## 2020-11-24 MED ORDER — LIDOCAINE HCL (PF) 1 % IJ SOLN
INTRAMUSCULAR | Status: AC
  Filled 2020-11-24: qty 30

## 2020-11-24 MED ORDER — SODIUM CHLORIDE 0.9 % IV SOLN: INTRAVENOUS | Status: DC | PRN

## 2020-11-24 MED ORDER — CEFAZOLIN SODIUM-DEXTROSE 2-4 GM/100ML-% IV SOLN
2.0000 g | INTRAVENOUS | Status: AC
  Administered 2020-11-24: 2 g via INTRAVENOUS
  Filled 2020-11-24: qty 100

## 2020-11-24 MED ORDER — FENTANYL CITRATE (PF) 100 MCG/2ML IJ SOLN: 25.0000 ug | INTRAMUSCULAR | Status: DC | PRN

## 2020-11-24 MED ORDER — HYDROCODONE-ACETAMINOPHEN 5-325 MG PO TABS: 1.0000 | ORAL_TABLET | ORAL | 0 refills | Status: DC | PRN

## 2020-11-24 MED ORDER — LIDOCAINE-EPINEPHRINE 1 %-1:100000 IJ SOLN
INTRAMUSCULAR | Status: AC
  Filled 2020-11-24: qty 1

## 2020-11-24 MED ORDER — PROPOFOL 10 MG/ML IV BOLUS
INTRAVENOUS | Status: DC | PRN
  Administered 2020-11-24: 30 mg via INTRAVENOUS

## 2020-11-24 MED ORDER — CHLORHEXIDINE GLUCONATE 4 % EX LIQD: 60.0000 mL | Freq: Once | CUTANEOUS | Status: DC

## 2020-11-24 MED ORDER — ONDANSETRON HCL 4 MG/2ML IJ SOLN: 4.0000 mg | Freq: Once | INTRAMUSCULAR | Status: DC | PRN

## 2020-11-24 MED ORDER — 0.9 % SODIUM CHLORIDE (POUR BTL) OPTIME
TOPICAL | Status: DC | PRN
  Administered 2020-11-24: 1000 mL

## 2020-11-24 MED ORDER — FENTANYL CITRATE (PF) 250 MCG/5ML IJ SOLN
INTRAMUSCULAR | Status: AC
  Filled 2020-11-24: qty 5

## 2020-11-24 MED ORDER — MIDAZOLAM HCL 2 MG/2ML IJ SOLN
INTRAMUSCULAR | Status: DC | PRN
  Administered 2020-11-24: 1 mg via INTRAVENOUS

## 2020-11-24 MED ORDER — BUPIVACAINE-EPINEPHRINE (PF) 0.5% -1:200000 IJ SOLN
INTRAMUSCULAR | Status: DC | PRN
  Administered 2020-11-24: 30 mL

## 2020-11-24 MED ORDER — FENTANYL CITRATE (PF) 250 MCG/5ML IJ SOLN
INTRAMUSCULAR | Status: DC | PRN
  Administered 2020-11-24: 50 ug via INTRAVENOUS

## 2020-11-24 MED ORDER — HEPARIN SODIUM (PORCINE) 1000 UNIT/ML IJ SOLN
INTRAMUSCULAR | Status: DC | PRN
  Administered 2020-11-24: 3000 [IU] via INTRAVENOUS

## 2020-11-24 MED ORDER — CHLORHEXIDINE GLUCONATE 0.12 % MT SOLN
OROMUCOSAL | Status: AC
  Administered 2020-11-24: 15 mL
  Filled 2020-11-24: qty 15

## 2020-11-24 MED ORDER — INSULIN ASPART 100 UNIT/ML IJ SOLN
8.0000 [IU] | Freq: Once | INTRAMUSCULAR | Status: AC
  Administered 2020-11-24: 8 [IU] via SUBCUTANEOUS

## 2020-11-24 MED ORDER — PROPOFOL 500 MG/50ML IV EMUL
INTRAVENOUS | Status: DC | PRN
  Administered 2020-11-24: 50 ug/kg/min via INTRAVENOUS

## 2020-11-24 MED ORDER — HEPARIN 6000 UNIT IRRIGATION SOLUTION
Status: AC
  Filled 2020-11-24: qty 500

## 2020-11-24 MED ORDER — LIDOCAINE HCL 1 % IJ SOLN
INTRAMUSCULAR | Status: DC | PRN
  Administered 2020-11-24: 1 mL

## 2020-11-24 MED ORDER — SODIUM CHLORIDE 0.9 % IV SOLN: INTRAVENOUS | Status: DC

## 2020-11-24 MED ORDER — PROPOFOL 1000 MG/100ML IV EMUL
INTRAVENOUS | Status: AC
  Filled 2020-11-24: qty 100

## 2020-11-24 SURGICAL SUPPLY — 37 items
ARMBAND PINK RESTRICT EXTREMIT (MISCELLANEOUS) ×2 IMPLANT
BAG COUNTER SPONGE SURGICOUNT (BAG) ×2 IMPLANT
CANISTER SUCT 3000ML PPV (MISCELLANEOUS) ×2 IMPLANT
CLIP VESOCCLUDE MED 24/CT (CLIP) ×2 IMPLANT
CLIP VESOCCLUDE SM WIDE 24/CT (CLIP) ×2 IMPLANT
COVER PROBE W GEL 5X96 (DRAPES) ×2 IMPLANT
DECANTER SPIKE VIAL GLASS SM (MISCELLANEOUS) ×2 IMPLANT
DERMABOND ADVANCED (GAUZE/BANDAGES/DRESSINGS) ×1
DERMABOND ADVANCED .7 DNX12 (GAUZE/BANDAGES/DRESSINGS) ×1 IMPLANT
ELECT REM PT RETURN 9FT ADLT (ELECTROSURGICAL) ×2
ELECTRODE REM PT RTRN 9FT ADLT (ELECTROSURGICAL) ×1 IMPLANT
GLOVE SRG 8 PF TXTR STRL LF DI (GLOVE) ×1 IMPLANT
GLOVE SURG ENC MOIS LTX SZ7.5 (GLOVE) ×2 IMPLANT
GLOVE SURG UNDER POLY LF SZ7 (GLOVE) ×4 IMPLANT
GLOVE SURG UNDER POLY LF SZ8 (GLOVE) ×1
GOWN STRL REUS W/ TWL LRG LVL3 (GOWN DISPOSABLE) ×2 IMPLANT
GOWN STRL REUS W/ TWL XL LVL3 (GOWN DISPOSABLE) ×2 IMPLANT
GOWN STRL REUS W/TWL LRG LVL3 (GOWN DISPOSABLE) ×2
GOWN STRL REUS W/TWL XL LVL3 (GOWN DISPOSABLE) ×2
HEMOSTAT SPONGE AVITENE ULTRA (HEMOSTASIS) IMPLANT
KIT BASIN OR (CUSTOM PROCEDURE TRAY) ×2 IMPLANT
KIT TURNOVER KIT B (KITS) ×2 IMPLANT
NEEDLE HYPO 25GX1X1/2 BEV (NEEDLE) ×2 IMPLANT
NS IRRIG 1000ML POUR BTL (IV SOLUTION) ×2 IMPLANT
PACK CV ACCESS (CUSTOM PROCEDURE TRAY) ×2 IMPLANT
PAD ARMBOARD 7.5X6 YLW CONV (MISCELLANEOUS) ×4 IMPLANT
SUT MNCRL AB 4-0 PS2 18 (SUTURE) ×2 IMPLANT
SUT PROLENE 6 0 BV (SUTURE) ×6 IMPLANT
SUT PROLENE 7 0 BV 1 (SUTURE) IMPLANT
SUT SILK 2 0 SH (SUTURE) ×2 IMPLANT
SUT VIC AB 2-0 CT1 27 (SUTURE)
SUT VIC AB 2-0 CT1 TAPERPNT 27 (SUTURE) IMPLANT
SUT VIC AB 3-0 SH 27 (SUTURE) ×2
SUT VIC AB 3-0 SH 27X BRD (SUTURE) ×2 IMPLANT
TOWEL GREEN STERILE (TOWEL DISPOSABLE) ×2 IMPLANT
UNDERPAD 30X36 HEAVY ABSORB (UNDERPADS AND DIAPERS) ×2 IMPLANT
WATER STERILE IRR 1000ML POUR (IV SOLUTION) ×2 IMPLANT

## 2020-11-24 NOTE — Discharge Instructions (Signed)
   [facility]  Discharge Instructions  AV Fistula or Graft Surgery for Dialysis Access  Please refer to the following instructions for your post-procedure care. Your surgeon or physician assistant will discuss any changes with you.  Activity  You may drive the day following your surgery, if you are comfortable and no longer taking prescription pain medication. Resume full activity as the soreness in your incision resolves.  Bathing/Showering  You may shower after you go home. Keep your incision dry for 48 hours. Do not soak in a bathtub, hot tub, or swim until the incision heals completely. You may not shower if you have a hemodialysis catheter.  Incision Care  Clean your incision with mild soap and water after 48 hours. Pat the area dry with a clean towel. You do not need a bandage unless otherwise instructed. Do not apply any ointments or creams to your incision. You may have skin glue on your incision. Do not peel it off. It will come off on its own in about one week. Your arm may swell a bit after surgery. To reduce swelling use pillows to elevate your arm so it is above your heart. Your doctor will tell you if you need to lightly wrap your arm with an ACE bandage.  Diet  Resume your normal diet. There are not special food restrictions following this procedure. In order to heal from your surgery, it is CRITICAL to get adequate nutrition. Your body requires vitamins, minerals, and protein. Vegetables are the best source of vitamins and minerals. Vegetables also provide the perfect balance of protein. Processed food has little nutritional value, so try to avoid this.  Medications  Resume taking all of your medications. If your incision is causing pain, you may take over-the counter pain relievers such as acetaminophen (Tylenol). If you were prescribed a stronger pain medication, please be aware these medications can cause nausea and constipation. Prevent  nausea by taking the medication with a snack or meal. Avoid constipation by drinking plenty of fluids and eating foods with high amount of fiber, such as fruits, vegetables, and grains.  Do not take Tylenol if you are taking prescription pain medications.  Follow up Your surgeon may want to see you in the office following your access surgery. If so, this will be arranged at the time of your surgery.  Please call us immediately for any of the following conditions:  Increased pain, redness, drainage (pus) from your incision site Fever of 101 degrees or higher Severe or worsening pain at your incision site Hand pain or numbness.  Reduce your risk of vascular disease:  Stop smoking. If you would like help, call [facility] at 1-800-QUIT-NOW 734-314-3298) or [place] at [place] your cholesterol Maintain a desired weight Control your diabetes Keep your blood pressure down  Dialysis  It will take several weeks to several months for your new dialysis access to be ready for use. Your surgeon will determine when it is okay to use it. Your nephrologist will continue to direct your dialysis. You can continue to use your Permcath until your new access is ready for use.   11/24/2020 Hannah [name] [id number] 11-20-1965  Surgeon(s): Marty Heck, MD  Procedure(s): SECOND STAGE RIGHT ARM BASCILIC VEIN TRANSPOSITION   May stick graft immediately   May stick graft on designated area only:   x Do not stick until follow-up    If you have any questions, please call the office at [phone].

## 2020-11-24 NOTE — Telephone Encounter (Signed)
Patient's daughter calls today to ask about numbness s/p BVT today - patient had a PNB placed for anesthesia. Advised it took several hours and could be up to 24 hour before feeling completely returned. No further questions/concerns at this time.

## 2020-11-24 NOTE — H&P (Signed)
History and Physical Interval Note:  11/24/2020 7:28 AM  Hannah [name]  has presented today for surgery, with the diagnosis of ESRD.  The various methods of treatment have been discussed with the patient and family. After consideration of risks, benefits and other options for treatment, the patient has consented to  Procedure(s): [place] (Right) as a surgical intervention.  The patient's history has been reviewed, patient examined, no change in status, stable for surgery.  I have reviewed the patient's chart and labs.  Questions were answered to the patient's satisfaction.    Right 2nd stage BVT  Marty Heck  POST OPERATIVE OFFICE NOTE       CC:  F/u for surgery   HPI:  This is a 55 y.o. female who is s/p right  brachial artery to basilic vein AV fistula on 10/02/20 by Dr. Donzetta Matters.     Pt returns today for follow up.  Pt states she has no new symptoms of increased pain, weakness or loss of further sensation.  She has a history of stroke with right sided weakness and uses a WC for mobility.  She is on HD TTS via [place] placed by [facility].          Allergies  Allergen Reactions   Ibuprofen Other (See Comments) and Swelling      Edema Swelling to hands, feet and face     Influenza Vaccines Anaphylaxis      Swelling, fever   Pneumococcal Vaccine Anaphylaxis      Swelling, fever Swelling, fever     Promethazine Other (See Comments)      hallucinations hallucinations     Ambien [Zolpidem]        Other reaction(s): Unknown   Cyclobenzaprine Other (See Comments)      hallucinations     Tape Itching   Tramadol Hcl        Other reaction(s): hallucinations   Oxycodone Nausea Only and Nausea And Vomiting            Current Outpatient Medications  Medication Sig Dispense Refill   amLODipine (NORVASC) 10 MG tablet Take 1 tablet (10 mg total) by mouth daily. 30 tablet 0   carvedilol (COREG) 25 MG tablet Take 1 tablet (25 mg total) by mouth 2  (two) times daily with a meal. 60 tablet 0   clopidogrel (PLAVIX) 75 MG tablet Take 1 tablet (75 mg total) by mouth daily. 30 tablet 0   dantrolene (DANTRIUM) 50 MG capsule Take 1 capsule (50 mg total) by mouth 3 (three) times daily. 90 capsule 0   diclofenac Sodium (VOLTAREN) 1 % GEL Apply 2 g topically 4 (four) times daily. To leg 350 g 0   famotidine (PEPCID) 20 MG tablet Take 1 tablet (20 mg total) by mouth daily. 30 tablet 0   gabapentin (NEURONTIN) 100 MG capsule Take 1 capsule (100 mg total) by mouth at bedtime. 30 capsule 0   hydrALAZINE (APRESOLINE) 50 MG tablet Take 1 tablet (50 mg total) by mouth every 8 (eight) hours. 90 tablet 0   insulin degludec (TRESIBA FLEXTOUCH) 100 UNIT/ML FlexTouch Pen Inject 12 Units into the skin at bedtime. 15 mL 0   insulin lispro (HUMALOG) 100 UNIT/ML KwikPen Inject 5 Units into the skin 3 (three) times daily. 15 mL 0   melatonin 3 MG TABS tablet Take 1 tablet (3 mg total) by mouth at bedtime. 30 tablet 0   nitroGLYCERIN (NITROSTAT) 0.4 MG SL tablet Place 1 tablet (  0.4 mg total) under the tongue every 5 (five) minutes as needed for chest pain. 30 tablet 12   oxyCODONE-acetaminophen (PERCOCET/ROXICET) 5-325 MG tablet Take 1 tablet by mouth daily as needed for severe pain. 5 tablet 0   polyethylene glycol (MIRALAX / GLYCOLAX) 17 g packet Take 17 g by mouth daily. 30 each 0   rosuvastatin (CRESTOR) 20 MG tablet Take 1 tablet (20 mg total) by mouth daily. 30 tablet 0   senna-docusate (SENOKOT-S) 8.6-50 MG tablet Take 1 tablet by mouth 2 (two) times daily as needed for moderate constipation. 60 tablet 0   tiZANidine (ZANAFLEX) 2 MG tablet Take 1 tablet (2 mg total) by mouth at bedtime. 30 tablet 0    No current facility-administered medications for this visit.       ROS:  See HPI   Physical Exam:    Findings:  +--------------------+----------+-----------------+--------+  AVF                 PSV (cm/s)Flow Vol (mL/min)Comments   +--------------------+----------+-----------------+--------+  Native artery inflow   171           748                 +--------------------+----------+-----------------+--------+  AVF Anastomosis        329                               +--------------------+----------+-----------------+--------+      +------------+----------+-------------+----------+--------------+  OUTFLOW VEINPSV (cm/s)Diameter (cm)Depth (cm)   Describe     +------------+----------+-------------+----------+--------------+  Prox UA        151        0.94        1.50                   +------------+----------+-------------+----------+--------------+  Mid UA         153        0.43        1.77                   +------------+----------+-------------+----------+--------------+  Dist UA        422        0.45        1.42   Retained valve  +------------+----------+-------------+----------+--------------+  AC Fossa       257        0.45        0.98                   +------------+----------+-------------+----------+--------------+          Summary:  Patent right BVT.  Elevated velocity at a retained valve in the distal upper arm, not  hemodynamically significant.  No thrombus seen.  No significant branches seen.         Incision:  Well healed  Extremities:  Palpable radial pulse and excellent palpable thrill in the fistula HENT: WNL, normocephalic Pulmonary: normal non-labored breathing Cardiac: regular, right IJ TDC in place, dressings c/d/i Abdomen: soft, NT/ND, no masses     Assessment/Plan:  This is a 55 y.o. female who is s/p:First stage right UE basilic vein fistula Without symptoms of steal.               The fistula is deep > 0.6 and is is maturing with a retained valve 4.5 mm in diameter.  I will schedule her for basilic transposition second stage.  Hopefully with branch  ligation and transposition it will continue to mature and be usable.  If the fistula does  not mature she will need a right AV graft.  Dr. Donzetta Matters will be out of town so we will schedule her with first available MD.   She is on Plavix.     Roxy Horseman PA-C [facility] [phone]     Clinic MD:  Donzetta Matters

## 2020-11-24 NOTE — Transfer of Care (Signed)
Immediate Anesthesia Transfer of Care Note  Patient: Hannah [name]  Procedure(s) Performed: SECOND STAGE RIGHT ARM BASCILIC VEIN TRANSPOSITION (Right: Arm Upper)  Patient Location: PACU  Anesthesia Type:MAC combined with regional for post-op pain  Level of Consciousness: awake, alert  and oriented  Airway & Oxygen Therapy: Patient Spontanous Breathing and Patient connected to face mask oxygen  Post-op Assessment: Report given to RN and Post -op Vital signs reviewed and stable  Post vital signs: Reviewed and stable  Last Vitals:  Vitals Value Taken Time  BP 148/76 11/24/20 0946  Temp    Pulse 79 11/24/20 0950  Resp 19 11/24/20 0950  SpO2 100 % 11/24/20 0950  Vitals shown include unvalidated device data.  Last Pain:  Vitals:   11/24/20 0630  TempSrc:   PainSc: 0-No pain         Complications: No notable events documented.

## 2020-11-24 NOTE — Anesthesia Procedure Notes (Signed)
Anesthesia Regional Block: Supraclavicular block   Pre-Anesthetic Checklist: , timeout performed,  Correct Patient, Correct Site, Correct Laterality,  Correct Procedure, Correct Position, site marked,  Risks and benefits discussed,  Surgical consent,  Pre-op evaluation,  At surgeon's request and post-op pain management  Laterality: Right  Prep: chloraprep       Needles:  Injection technique: Single-shot  Needle Type: Echogenic Stimulator Needle     Needle Length: 10cm  Needle Gauge: 21   Needle insertion depth: 6 cm   Additional Needles:   Procedures:,,,, ultrasound used (permanent image in chart),,   Motor weakness within 7 minutes.  Narrative:  Start time: 11/24/2020 7:16 AM End time: 11/24/2020 7:21 AM Injection made incrementally with aspirations every 5 mL.  Performed by: Personally  Anesthesiologist: Josephine Igo, MD  Additional Notes: Timeout performed. Patient sedated. Relevant anatomy ID'd using [place]. Incremental 2-73m injection of LA with frequent aspiration. Patient tolerated procedure well.    Right Supraclavicular Block

## 2020-11-24 NOTE — Anesthesia Postprocedure Evaluation (Signed)
Anesthesia Post Note  Patient: Estelene Carmack  Procedure(s) Performed: SECOND STAGE RIGHT ARM [place] (Right: Arm Upper)     Patient location during evaluation: PACU Anesthesia Type: Regional and MAC Level of consciousness: awake and alert Pain management: pain level controlled Vital Signs Assessment: post-procedure vital signs reviewed and stable Respiratory status: spontaneous breathing, nonlabored ventilation and respiratory function stable Cardiovascular status: stable and blood pressure returned to baseline Postop Assessment: no apparent nausea or vomiting Anesthetic complications: no   No notable events documented.  Last Vitals:  Vitals:   11/24/20 1030 11/24/20 1045  BP: (!) 168/90 (!) 146/72  Pulse: 75 82  Resp: 14 13  Temp:  36.5 C  SpO2: 96% 99%    Last Pain:  Vitals:   11/24/20 1045  TempSrc:   PainSc: 0-No pain                 Mackayla Mullins A.

## 2020-11-24 NOTE — Op Note (Signed)
    OPERATIVE NOTE   PROCEDURE: right second stage basilic vein transposition (brachiobasilic arteriovenous fistula) placement  PRE-OPERATIVE DIAGNOSIS: ESRD  POST-OPERATIVE DIAGNOSIS: same  SURGEON: Marty Heck, MD  ASSISTANT(S): OR Staff  ANESTHESIA: regional  ESTIMATED BLOOD LOSS: <50 mL  FINDING(S): Basilic vein was small and sclerotic near the antecubital fossa.  I was able to dilate this with manual dilators and it was transposed through a new skin tunnel after it was fully mobilized and transected.  There was a palpable thrill after a new end to end anastomosis was performed.    SPECIMEN(S):  None  INDICATIONS:   Hannah [name] is a 55 y.o. female who presents with ESRD.  The patient is scheduled for right second stage basilic vein transposition.  The patient is aware the risks include but are not limited to: bleeding, infection, steal syndrome, nerve damage, ischemic monomelic neuropathy, failure to mature, and need for additional procedures.  The patient is aware of the risks of the procedure and elects to proceed forward.   DESCRIPTION: After full informed written consent was obtained from the patient, the patient was brought back to the operating room and placed supine upon the operating table.  Prior to induction, the patient received IV antibiotics.   After obtaining adequate anesthesia, the patient was then prepped and draped in the standard fashion for a right arm access procedure.  I turned my attention first to identifying the patient's brachiobasilic arteriovenous fistula.  Using SonoSite guidance, the location of this fistula was marked out on the skin.    This was a marginal caliber vein.  I made three longitudinal incisions on the medial aspect of the right upper arm.  Through these incisions I dissected out circumferentially the basilic vein, taking care to protect the nerve.  Once the vein was fully mobilized, all side branches were ligated between silk ties.   The vein was marked for orientation.  I then used a curved tunneler to create a subcutaneous tunnel.  The patient was given 3000 units IV heparin.  The vein was then transected near the antecubital crease.  It was then brought to the previously created tunnel making sure to maintain proper orientation.  A primary anastomosis was then performed between the two cut ends of the vein with a running 6-0 Prolene after I spatulated each end.  Once this was done the clamps were released.  There was excellent flow through the fistula.  Hemostasis was then achieved.  The wound was irrigated.  The incision was closed with a deep layer of 3-0 Vicryl followed by a subcutaneous 4-0 Monocryl and Dermabond.  There were no immediate complications.  COMPLICATIONS: None  CONDITION: Stable  Marty Heck, MD [facility] Office: [place]  11/24/2020, 9:16 AM

## 2020-11-25 ENCOUNTER — Encounter (HOSPITAL_COMMUNITY): Payer: Self-pay | Admitting: Vascular Surgery

## 2020-11-27 ENCOUNTER — Other Ambulatory Visit: Payer: Self-pay | Admitting: *Deleted

## 2020-11-27 ENCOUNTER — Ambulatory Visit: Payer: Medicare Other | Admitting: *Deleted

## 2020-11-27 NOTE — Patient Outreach (Addendum)
[facility]) Care Management  11/27/2020  Hannah [name] 1965-08-29 [id number]   Telephone Assessment    Referral Date : 620/22 Referral source: [facility] hospital liasion Referral reason : Complex care and disease management follow up call, assess for further needs. Insurance: [employer]   Inpatient admission at [facility] 5/13-10/18/20 for volume overload, acute on chronic renal failure, Hemodialysis initiated , RUE fistula placed, spontaneous right psoas hematoma. Inpatient CIR 6/4-6/20/22 , Debility, history of stroke, spastic hemaplegia. PMHx; Diabetes type 2 CVA , hypertension  Subjective: Outreach call to patient preferred contact number,her daughter [name] answers phone stating patient is currently at hemodialysis and request return call later today.  Miesha discussed patient recent vascular procedure related to her Fistula.  Daughter discussed outpatient therapy is not going to work for the patient, she wishes that I speak with patient regarding this matter if at home therapy would be appropriate. She states she doesn't believe patient has had follow up appointment with Dr. Jacelyn Grip since discharge.  1630 Return call to patient no answer able to leave a HIPAA compliant voice mail message for return call. Call to patient daughter no answer able to leave a HIPAA compliant voicemail.   Plan Will plan return call in the next  4 business days     Joylene Draft, RN, BSN  [place] Management Coordinator  [phone]- Mobile [phone]- [place]

## 2020-12-03 ENCOUNTER — Other Ambulatory Visit: Payer: Self-pay

## 2020-12-03 ENCOUNTER — Other Ambulatory Visit: Payer: Self-pay | Admitting: *Deleted

## 2020-12-03 ENCOUNTER — Telehealth: Payer: Self-pay | Admitting: *Deleted

## 2020-12-03 ENCOUNTER — Encounter: Payer: Self-pay | Admitting: Vascular Surgery

## 2020-12-03 ENCOUNTER — Ambulatory Visit (INDEPENDENT_AMBULATORY_CARE_PROVIDER_SITE_OTHER): Payer: Medicare Other | Admitting: Vascular Surgery

## 2020-12-03 VITALS — BP 167/76 | HR 89 | Temp 97.6°F | Resp 16 | Ht 66.0 in | Wt 122.0 lb

## 2020-12-03 DIAGNOSIS — Z992 Dependence on renal dialysis: Secondary | ICD-10-CM

## 2020-12-03 DIAGNOSIS — N186 End stage renal disease: Secondary | ICD-10-CM

## 2020-12-03 MED ORDER — OXYCODONE-ACETAMINOPHEN 5-325 MG PO TABS: 1.0000 | ORAL_TABLET | ORAL | 0 refills | Status: DC | PRN

## 2020-12-03 NOTE — Patient Outreach (Signed)
[facility]) Care Management  12/03/2020  Hannah [name] 1966-03-29 [id number]   Telephone Assessment    Referral Date : 620/22 Referral source: [facility] hospital liasion Referral reason : Complex care and disease management follow up call, assess for further needs. Insurance: [employer]   Inpatient admission at [facility] 5/13-10/18/20 for volume overload, acute on chronic renal failure, Hemodialysis initiated , RUE fistula placed, spontaneous right psoas hematoma. Inpatient CIR 6/4-6/20/22 , Debility, history of stroke, spastic hemaplegia. PMHx; Diabetes type 2 CVA , hypertension    Subjective Unsuccessful call attempt to patient daughter, Hannah [name] able to leave a HIPAA compliant voicemail message for return call.  Outreach call to patient, Hannah [name] reports having visit at vascular doctor on today due to pain in her right arm areas at site of recent vascular basiliac vein procedure . She discussed having medication to help with pain management, and MD states symptoms should gradually improve. Noted fistula patent and radial pulse present. She states she does not feel like talking at this time.   Plan Patient is agreeable to return call in the next week. Encouraged continued follow up with provider regarding concerns with pain management for site.   Joylene Draft, RN, BSN  [place] Management Coordinator  [phone]- Mobile [phone]- Toll Free Main Office

## 2020-12-03 NOTE — Addendum Note (Signed)
Addended by: Angelia Mould on: 12/03/2020 04:27 PM   Modules accepted: Orders

## 2020-12-03 NOTE — Progress Notes (Addendum)
Patient name: Hannah [name] MRN: [id number] DOB: 09-07-1965 Sex: female  REASON FOR VISIT:   Pain after second stage basilic vein transposition  HPI:   Hannah [name] is a pleasant 55 y.o. female who had a right first stage basilic vein transposition by Dr. Donzetta Matters on 10/02/2020.  The patient most recently had a second stage basilic vein transposition by Dr. Carlis Abbott on 11/24/2020.  She was seen as an add-on today because of severe pain from her recent surgery.  She has had pain in her right upper extremity since surgery and complains of some mild numbness in her forearm.  The pain has been fairly persistent.  There are no aggravating or alleviating factors.  She denies any fever or chills.  Current Outpatient Medications  Medication Sig Dispense Refill   amLODipine (NORVASC) 10 MG tablet Take 1 tablet (10 mg total) by mouth daily. 30 tablet 0   calcium acetate (PHOSLO) 667 MG capsule Take 667 mg by mouth See admin instructions. Take 667 mg by mouth with each meal and snacks     carvedilol (COREG) 25 MG tablet Take 1 tablet (25 mg total) by mouth 2 (two) times daily with a meal. 60 tablet 0   clopidogrel (PLAVIX) 75 MG tablet Take 1 tablet (75 mg total) by mouth daily. 30 tablet 0   dantrolene (DANTRIUM) 50 MG capsule Take 1 capsule (50 mg total) by mouth 3 (three) times daily. 90 capsule 0   diclofenac Sodium (VOLTAREN) 1 % GEL Apply 2 g topically 4 (four) times daily. To leg (Patient taking differently: Apply 2 g topically 4 (four) times daily as needed (pain). To leg) 350 g 0   famotidine (PEPCID) 20 MG tablet Take 1 tablet (20 mg total) by mouth daily. 30 tablet 0   gabapentin (NEURONTIN) 100 MG capsule Take 1 capsule (100 mg total) by mouth at bedtime. 30 capsule 0   hydrALAZINE (APRESOLINE) 50 MG tablet Take 1 tablet (50 mg total) by mouth every 8 (eight) hours. 90 tablet 0   HYDROcodone-acetaminophen (NORCO/VICODIN) 5-325 MG tablet Take 1 tablet by mouth every 4 (four) hours as needed for  moderate pain. 20 tablet 0   insulin degludec (TRESIBA FLEXTOUCH) 100 UNIT/ML FlexTouch Pen Inject 12 Units into the skin at bedtime. 15 mL 0   insulin lispro (HUMALOG) 100 UNIT/ML KwikPen Inject 5 Units into the skin 3 (three) times daily. 15 mL 0   melatonin 5 MG TABS Take 5 mg by mouth at bedtime.     nitroGLYCERIN (NITROSTAT) 0.4 MG SL tablet Place 1 tablet (0.4 mg total) under the tongue every 5 (five) minutes as needed for chest pain. 30 tablet 12   polyethylene glycol (MIRALAX / GLYCOLAX) 17 g packet Take 17 g by mouth daily. (Patient taking differently: Take 17 g by mouth daily as needed for moderate constipation.) 30 each 0   senna-docusate (SENOKOT-S) 8.6-50 MG tablet Take 1 tablet by mouth 2 (two) times daily as needed for moderate constipation. 60 tablet 0   tiZANidine (ZANAFLEX) 2 MG tablet Take 1 tablet (2 mg total) by mouth at bedtime. 30 tablet 0   No current facility-administered medications for this visit.    REVIEW OF SYSTEMS:  '[X]'$  denotes positive finding, '[ ]'$  denotes negative finding Vascular    Leg swelling    Cardiac    Chest pain or chest pressure:    Shortness of breath upon exertion:    Short of breath when lying flat:    Irregular heart rhythm:  Constitutional    Fever or chills:     PHYSICAL EXAM:   Vitals:   12/03/20 1056  BP: (!) 167/76  Pulse: 89  Resp: 16  Temp: 97.6 F (36.4 C)  TempSrc: Temporal  SpO2: 96%  Weight: 122 lb (55.3 kg)  Height: '5\' 6"'$  (1.676 m)    GENERAL: The patient is a well-nourished female, in no acute distress. The vital signs are documented above. CARDIOVASCULAR: There is a regular rate and rhythm. PULMONARY: There is good air exchange bilaterally without wheezing or rales. VASCULAR: She has a good thrill in her right upper arm fistula.  She has a normal right radial pulse.  Her incisions are all healing nicely.  DATA:   No new data  MEDICAL ISSUES:   END-STAGE RENAL DISEASE: The patient underwent a second stage  basilic vein transposition on 11/24/2020.  The fistula is patent and she has a palpable radial pulse.  Her incisions are all healing nicely and I do not see any evidence of infection.  She has some mild paresthesias in the forearm and I think this is related to dissecting the nerve away from the basilic vein.  I think her symptoms should gradually improve over the next week.  She will keep her regularly scheduled appointment.  ADDENDUM: The patient called the office requesting pain medicine.  I think this is reasonable and I have sent in a prescription for Percocet to her pharmacy on [address].  Deitra Mayo [facility] [place] [phone]

## 2020-12-03 NOTE — Telephone Encounter (Signed)
Family member called to report 9/10 pain in right arm s/p 2nd stage BVT on 7/11. She states that her wrist is numb with pain shooting up to the incision sites. She spoke with the on call doctor this morning and was told to make an appt to be seen today. Added her on to Dr. Nicole Cella schedule.

## 2020-12-10 ENCOUNTER — Other Ambulatory Visit: Payer: Self-pay | Admitting: *Deleted

## 2020-12-10 NOTE — Patient Outreach (Signed)
[facility]) Care Management  12/10/2020  Hannah [name] 09-29-1965 [id number]   Referral Date : 620/22 Referral source: [facility] hospital liasion Referral reason : Complex care and disease management follow up call, assess for further needs. Insurance: [employer]   Inpatient admission at [facility] 5/13-10/18/20 for volume overload, acute on chronic renal failure, Hemodialysis initiated , RUE fistula placed, spontaneous right psoas hematoma. Inpatient CIR 6/4-6/20/22 , Debility, history of stroke, spastic hemaplegia. PMHx; Diabetes type 2 , CVA , hypertension, ESRD, Hemodialysis , TTS.   Subjective Unsuccessful outreach call to patient daughter Quentin Abramowicz, no answer able to leave a HIPAA complaint voicemail message.   Outreach call to patient successful, she discussed ongoing pain at Right arm since recent vascular procedure for 2nd stage basilic vein transposition on 7/11. She reports having office visit with PCP on yesterday reports recommended follow up again with Vascular doctor regarding pain concerns. She reports complaint of itching when taking  a whole tablet of she identifies percocet, stating it causes itching so she takes a 1/2 tablet and not getting relief, she will have her daughter call MD office today.  Patient discussed not interested in following up regarding outpatient therapy until pain situation resolved in right arm.    Goals Addressed             This Visit's Progress    Follow My Treatment Plan-Chronic Kidney   On track    Timeframe:  Long-Range Goal Priority:  Medium Start Date:    11/04/20                         Expected End Date:  01/13/21                     Follow Up Date 12/17/20   - keep follow-up appointments - keep taking my medicines, even when I feel good    Why is this important?   Staying as healthy as you can is very important. This may mean making changes if you smoke, don't exercise or eat poorly.  A healthy lifestyle  is an important goal for you.  Following the treatment plan and making changes may be hard.  Try some of these steps to help keep the disease from getting worse.     Notes:  7/27 reports Hemodialysis treatment going great, attending all sessions  Attending Hemodialysis treatments as scheduled.      Improve My Nutrition with the Foods I Eat and Drink   On track    Timeframe:  Long-Range Goal Priority:  Medium Start Date:    11/04/20                        Expected End Date:11/13/20                     Follow Up Date : 12/17/20   - choose small, frequent meals - include protein food, like egg, meat, chicken, peanut butter, beans and fish in each meal    Why is this important?   Eating 3 healthy meals, or 5 or 6 small meals, a day is very important.  A healthy diet is one that has fruit, vegetables, protein, good fats, whole grains and low-fat dairy products.  Be sure to have plenty of protein that builds strong muscles. Many older people do not get quite enough protein.     Barriers: Knowledge   Notes:  7/27 patient acknowledged drinking nutrition supplement at HD session even though she does not like, reinforced importance of nutrition .  Patient acknowledges improved appetite and intake, fluid restriction limits.      Make and Keep All Appointments   On track    Timeframe:  Long-Range Goal Priority:  Medium Start Date:  11/04/20                          Expected End Date:  01/13/21                     Follow Up Date : 12/17/20   - keep a calendar with appointment dates    Why is this important?   Part of staying healthy is seeing the doctor for follow-up care.  If you forget your appointments, there are some things you can do to stay on track.  Barriers: None  Notes:  7/27 Commended for scheduling and attending post discharge visit with PCP. Reinforced follow up with vascular MD regarding pain from recent procedure right arm  Reviewed upcoming appointment with Vascular clinic  provided contact number and date/time of appointment . Discussed transportation with access [facility] or family member available. Daughter to schedule post dc visit with PCP .      Monitor and Manage My Blood Sugar-Diabetes Type 2   On track    Timeframe:  Long-Range Goal Priority:  Medium Start Date:   11/04/20                          Expected End Date: 01/13/21                   Follow Up Date 12/17/20   - check blood sugar at prescribed times - check blood sugar if I feel it is too high or too low - take the blood sugar log to all doctor visits - take the blood sugar meter to all doctor visits    Why is this important?   Checking your blood sugar at home helps to keep it from getting very high or very low.  Writing the results in a diary or log helps the doctor know how to care for you.  Your blood sugar log should have the time, date and the results.  Also, write down the amount of insulin or other medicine that you take.  Other information, like what you ate, exercise done and how you were feeling, will also be helpful.     Notes: 7/27 , Denies recent hypoglycemia , Goal restart : no recent hypoglycemia episodes, checking readings at least 4 times daily with freestyle libre and verifies with finger sticks. Readings in 120-170.     Set My Target A1C-Diabetes Type 2   On track    Timeframe:  Long-Range Goal Priority:  Medium Start Date:  11/04/20                           Expected End Date:  01/13/21                    Follow Up Date 12/17/20   - set target A1C    Why is this important?   Your target A1C is decided together by you and your doctor.  It is based on several things like your age and other health issues.   Barriers: Knowledge  Goal restart  Note: 7/27 recent Alc at PCP visit 7.3% improved from 8.7% on 08/21/20 Reviewed goal A1c and her recent A1c, encouraged to discuss with provider at visit            Plan Patient agreeable to goals and follow up plan in the next  week/   Joylene Draft, RN, BSN  [place] Management Coordinator  [phone]- Mobile [phone]- [place]

## 2020-12-15 ENCOUNTER — Telehealth: Payer: Self-pay

## 2020-12-15 HISTORY — PX: TRANSTHORACIC ECHOCARDIOGRAM: SHX275

## 2020-12-15 NOTE — Telephone Encounter (Signed)
Patient was called to move appt further back due to PA scheduling. She is reporting continued 9/10 constant burning pain. Says there is numbness in her arm, but not her fingers. Some swelling up near the elbow. Denies any redness or drainage. She is s/p right second stage BVT on 11/24/2020 and was seen as a triage appt on 7/20 by [facility]. Potentially pain due to nerves, but pain has not resolved, and per patient is getting steadily worse. This is the arm that was previously affected by a stroke. Added patient on for [facility] tomorrow; she has dialysis on [facility], currently dialyzing via catheter.

## 2020-12-16 ENCOUNTER — Encounter: Payer: Self-pay | Admitting: Vascular Surgery

## 2020-12-16 ENCOUNTER — Other Ambulatory Visit: Payer: Self-pay

## 2020-12-16 ENCOUNTER — Ambulatory Visit (INDEPENDENT_AMBULATORY_CARE_PROVIDER_SITE_OTHER): Payer: Medicare Other | Admitting: Vascular Surgery

## 2020-12-16 VITALS — BP 161/77 | HR 73 | Temp 97.6°F | Resp 18 | Ht 62.0 in | Wt 121.0 lb

## 2020-12-16 DIAGNOSIS — N186 End stage renal disease: Secondary | ICD-10-CM

## 2020-12-16 DIAGNOSIS — Z992 Dependence on renal dialysis: Secondary | ICD-10-CM

## 2020-12-16 MED ORDER — GABAPENTIN 300 MG PO CAPS: 300.0000 mg | ORAL_CAPSULE | Freq: Every day | ORAL | 2 refills | Status: DC

## 2020-12-16 MED ORDER — OXYCODONE-ACETAMINOPHEN 5-325 MG PO TABS: 1.0000 | ORAL_TABLET | ORAL | 0 refills | Status: DC | PRN

## 2020-12-16 MED ORDER — HYDROXYZINE HCL 10 MG PO TABS: 10.0000 mg | ORAL_TABLET | Freq: Three times a day (TID) | ORAL | 0 refills | Status: DC | PRN

## 2020-12-16 NOTE — Progress Notes (Signed)
Patient name: Idil Gillock MRN: [id number] DOB: 1965/08/15 Sex: female  REASON FOR VISIT: Right arm pain after second stage basilic vein transposition  HPI: Hannah [name] is a 55 y.o. female with end-stage renal disease who presents for evaluation of right arm pain after second stage BVT.  The surgery was performed on 11/24/2020.  She has been seen by Dr. Scot Dock post-op who felt this was likely pain related to the mobilization of the nerve.  She does describe some shooting pains into her right forearm.  She also describes a lot of constant pain that is aching out in the shoulder and lateral arm away from the fistula.  She had a previous stroke and feels her grip strength is the right arm is at baseline.  Past Medical History:  Diagnosis Date   Cerebral infarction ([place]) 07/12/2017   2009 first one, has had a total of 4 stokes   Chronic kidney disease    tues thurs sat - dialysis   Diabetes type 2, controlled ([place])    Hemiparesis affecting right side as late effect of cerebrovascular accident (CVA) ([place])    Hyperlipidemia    Hypertension    Multiple sclerosis ([place]) 2009   Stroke [facility])    Walker as ambulation aid     Past Surgical History:  Procedure Laterality Date   APPENDECTOMY     AV FISTULA PLACEMENT Right 10/02/2020   Procedure: RIGHT ARTERIOVENOUS (AV) FISTULA CREATION;  Surgeon: Waynetta Sandy, MD;  Location: [place];  Service: Vascular;  Laterality: Right;   [place] Right 11/24/2020   Procedure: SECOND STAGE RIGHT ARM [place];  Surgeon: Marty Heck, MD;  Location: [place];  Service: Vascular;  Laterality: Right;   CYSTOSTOMY W/ BLADDER BIOPSY     IR FLUORO GUIDE CV LINE RIGHT  09/29/2020   IR US GUIDE VASC ACCESS RIGHT  09/29/2020   OVARIAN CYST SURGERY     stent placed in heart      History reviewed. No pertinent family history.  SOCIAL HISTORY: Social History   Tobacco Use   Smoking status: Never   Smokeless  tobacco: Never  Substance Use Topics   Alcohol use: Not Currently    Allergies  Allergen Reactions   Ibuprofen Swelling    Swelling to hands, feet and face    Influenza Vaccines Anaphylaxis and Swelling    fever   Pneumococcal Vaccine Anaphylaxis and Swelling    fever   Promethazine Other (See Comments)    hallucinations   Ambien [Zolpidem]     Other reaction(s): Unknown   Cyclobenzaprine Other (See Comments)    hallucinations    Tape Itching   Tramadol Hcl Other (See Comments)    hallucinations   Oxycodone Nausea And Vomiting    Current Outpatient Medications  Medication Sig Dispense Refill   amLODipine (NORVASC) 10 MG tablet Take 1 tablet (10 mg total) by mouth daily. 30 tablet 0   calcium acetate (PHOSLO) 667 MG capsule Take 667 mg by mouth See admin instructions. Take 667 mg by mouth with each meal and snacks     carvedilol (COREG) 25 MG tablet Take 1 tablet (25 mg total) by mouth 2 (two) times daily with a meal. 60 tablet 0   clopidogrel (PLAVIX) 75 MG tablet Take 1 tablet (75 mg total) by mouth daily. 30 tablet 0   dantrolene (DANTRIUM) 50 MG capsule Take 1 capsule (50 mg total) by mouth 3 (three) times daily. 90 capsule 0  diclofenac Sodium (VOLTAREN) 1 % GEL Apply 2 g topically 4 (four) times daily. To leg (Patient taking differently: Apply 2 g topically 4 (four) times daily as needed (pain). To leg) 350 g 0   famotidine (PEPCID) 20 MG tablet Take 1 tablet (20 mg total) by mouth daily. 30 tablet 0   gabapentin (NEURONTIN) 100 MG capsule Take 1 capsule (100 mg total) by mouth at bedtime. 30 capsule 0   hydrALAZINE (APRESOLINE) 50 MG tablet Take 1 tablet (50 mg total) by mouth every 8 (eight) hours. 90 tablet 0   insulin degludec (TRESIBA FLEXTOUCH) 100 UNIT/ML FlexTouch Pen Inject 12 Units into the skin at bedtime. 15 mL 0   insulin lispro (HUMALOG) 100 UNIT/ML KwikPen Inject 5 Units into the skin 3 (three) times daily. 15 mL 0   melatonin 5 MG TABS Take 5 mg by mouth  at bedtime.     nitroGLYCERIN (NITROSTAT) 0.4 MG SL tablet Place 1 tablet (0.4 mg total) under the tongue every 5 (five) minutes as needed for chest pain. 30 tablet 12   oxyCODONE-acetaminophen (PERCOCET/ROXICET) 5-325 MG tablet Take 1 tablet by mouth every 4 (four) hours as needed for severe pain. (Patient taking differently: Take 1 tablet by mouth every 4 (four) hours as needed for severe pain (Patient taking differently, states that she is taking only 1/2 tablet).) 30 tablet 0   polyethylene glycol (MIRALAX / GLYCOLAX) 17 g packet Take 17 g by mouth daily. (Patient taking differently: Take 17 g by mouth daily as needed for moderate constipation.) 30 each 0   senna-docusate (SENOKOT-S) 8.6-50 MG tablet Take 1 tablet by mouth 2 (two) times daily as needed for moderate constipation. 60 tablet 0   tiZANidine (ZANAFLEX) 2 MG tablet Take 1 tablet (2 mg total) by mouth at bedtime. 30 tablet 0   HYDROcodone-acetaminophen (NORCO/VICODIN) 5-325 MG tablet Take 1 tablet by mouth every 4 (four) hours as needed for moderate pain. (Patient not taking: Reported on 12/16/2020) 20 tablet 0   No current facility-administered medications for this visit.    REVIEW OF SYSTEMS:  '[X]'$  denotes positive finding, '[ ]'$  denotes negative finding Cardiac  Comments:  Chest pain or chest pressure:    Shortness of breath upon exertion:    Short of breath when lying flat:    Irregular heart rhythm:        Vascular    Pain in calf, thigh, or hip brought on by ambulation:    Pain in feet at night that wakes you up from your sleep:     Blood clot in your veins:    Leg swelling:         Pulmonary    Oxygen at home:    Productive cough:     Wheezing:         Neurologic    Sudden weakness in arms or legs:     Sudden numbness in arms or legs:     Sudden onset of difficulty speaking or slurred speech:    Temporary loss of vision in one eye:     Problems with dizziness:         Gastrointestinal    Blood in stool:      Vomited blood:         Genitourinary    Burning when urinating:     Blood in urine:        Psychiatric    Major depression:         Hematologic  Bleeding problems:    Problems with blood clotting too easily:        Skin    Rashes or ulcers:        Constitutional    Fever or chills:      PHYSICAL EXAM: Vitals:   12/16/20 0925  BP: (!) 161/77  Pulse: 73  Resp: 18  Temp: 97.6 F (36.4 C)  TempSrc: Temporal  SpO2: 94%  Weight: 121 lb (54.9 kg)  Height: '5\' 2"'$  (1.575 m)    GENERAL: The patient is a well-nourished female, in no acute distress. The vital signs are documented above. CARDIAC: There is a regular rate and rhythm.  VASCULAR:  Right arm incisions clean dry intact Right fistula with good thrill ABDOMEN: Soft and non-tender. MUSCULOSKELETAL: There are no major deformities or cyanosis. NEUROLOGIC: Right arm strength weak but at baseline SKIN: There are no ulcers or rashes noted.   DATA:     Assessment/Plan:  55 year old female who presents for triage evaluation for ongoing right arm pain after second stage basilic vein fistula.  All of her incisions appear to be healing with no signs of infection.  She has a good thrill in the fistula.  Excellent radial pulse at the wrist and I do not see any overt evidence of steal.  Some of her complaint sounds like neuropathic pain potentially from mobilization of the nerve and I did increase her gabapentin from 100 mg to 300 mg at bedtime.  In addition a lot of the pain is in the outer arm toward the shoulder away from the surgical site and I am unclear about the exact etiology.  I do not see any hematoma.  Sounds like her pain medicine is causing a lot of itching so I will send a prescription for Atarax since Benadryl is not working.  I have her follow-up in 2 weeks with the PA.   Marty Heck, MD [facility] Office: [phone]

## 2020-12-17 ENCOUNTER — Other Ambulatory Visit: Payer: Self-pay | Admitting: Physical Medicine and Rehabilitation

## 2020-12-17 ENCOUNTER — Other Ambulatory Visit: Payer: Self-pay | Admitting: *Deleted

## 2020-12-17 NOTE — Patient Outreach (Addendum)
Hannah [name]) Care Management  12/17/2020  Hannah [name] July 14, 1965 [id number]  Telephone assessment   Referral Date : 620/22 Referral source: [name] hospital liasion Referral reason : Complex care and disease management follow up call, assess for further needs. Insurance: [employer]   Inpatient admission at [facility] 5/13-10/18/20 for volume overload, acute on chronic renal failure, Hemodialysis initiated , RUE fistula placed, spontaneous right psoas hematoma. Inpatient CIR 6/4-6/20/22 , Debility, history of stroke, spastic hemaplegia. PMHx; Diabetes type 2 , CVA , hypertension, ESRD, Hemodialysis , TTS.  Subjective Outreach call to patient daughter Hannah [name] , no answer able to leave HIPAA compliant voicemail message.  Placed call to patient she report not feeling so well on today, noted cough, she reports that she was diagnosed with covid on last evening.  She discussed that her daughter Hannah [name] is with her now, she called her to phone.  Hannah [name] states patient diagnosed with Covid after testing last evening. She reports receiving a call from Dr. Jacelyn [name] office to verify testing, she reports being told she can be seen at a walk in clinic. Hannah [name] reports patient vitals signs being fine, blood sugar 112-157, she does not have monitor to check oxygen level. She states patient denies shortness of breath but has a cough noted starting on this morning. She reports patient temperature up to 103 last night and 101. 2 on today she didn't mention this to office of temperature. Patient has taken Percocet one this am, and reports being told not to take extra tylenol with percocet. She states patient tolerated taking percocet , without increase in itching as she has also been prescribed , a pill for itching. She reports patient is tolerating some foods and liquids on today.  Reinforced notifying MD of worsening of symptoms she discussed the office informed them of the [facility] walk in clinic may be  option if needed. Patient daughter agrees to notify MD office/seek attention for  worsening symptoms or other recommendations.    Plan Will plan return call in next 4 business days. Reinforced worsening symptoms of shortness of breath , worsening cough, elevated temperature    Hannah Draft, RN, BSN  [place] Management Coordinator  [phone]- Mobile [phone]- [place]

## 2020-12-18 ENCOUNTER — Other Ambulatory Visit: Payer: Self-pay

## 2020-12-18 ENCOUNTER — Emergency Department (HOSPITAL_COMMUNITY)
Admission: EM | Admit: 2020-12-18 | Discharge: 2020-12-18 | Disposition: A | Discharge status: Home / Self Care | Payer: Medicare Other | Attending: Emergency Medicine | Admitting: Emergency Medicine

## 2020-12-18 ENCOUNTER — Encounter (HOSPITAL_COMMUNITY): Payer: Self-pay | Admitting: Emergency Medicine

## 2020-12-18 ENCOUNTER — Emergency Department (HOSPITAL_COMMUNITY): Payer: Medicare Other

## 2020-12-18 DIAGNOSIS — U071 COVID-19: Secondary | ICD-10-CM | POA: Insufficient documentation

## 2020-12-18 DIAGNOSIS — Z79899 Other long term (current) drug therapy: Secondary | ICD-10-CM | POA: Insufficient documentation

## 2020-12-18 DIAGNOSIS — I120 Hypertensive chronic kidney disease with stage 5 chronic kidney disease or end stage renal disease: Secondary | ICD-10-CM | POA: Insufficient documentation

## 2020-12-18 DIAGNOSIS — I2510 Atherosclerotic heart disease of native coronary artery without angina pectoris: Secondary | ICD-10-CM | POA: Insufficient documentation

## 2020-12-18 DIAGNOSIS — R531 Weakness: Secondary | ICD-10-CM

## 2020-12-18 DIAGNOSIS — Z992 Dependence on renal dialysis: Secondary | ICD-10-CM | POA: Diagnosis not present

## 2020-12-18 DIAGNOSIS — E1122 Type 2 diabetes mellitus with diabetic chronic kidney disease: Secondary | ICD-10-CM | POA: Insufficient documentation

## 2020-12-18 DIAGNOSIS — Z7902 Long term (current) use of antithrombotics/antiplatelets: Secondary | ICD-10-CM | POA: Insufficient documentation

## 2020-12-18 DIAGNOSIS — N186 End stage renal disease: Secondary | ICD-10-CM | POA: Diagnosis not present

## 2020-12-18 DIAGNOSIS — Z794 Long term (current) use of insulin: Secondary | ICD-10-CM | POA: Diagnosis not present

## 2020-12-18 LAB — COMPREHENSIVE METABOLIC PANEL
ALT: 14 U/L (ref 0–44)
AST: 34 U/L (ref 15–41)
Albumin: 3.4 g/dL — ABNORMAL LOW (ref 3.5–5.0)
Alkaline Phosphatase: 68 U/L (ref 38–126)
Anion gap: 11 (ref 5–15)
BUN: 21 mg/dL — ABNORMAL HIGH (ref 6–20)
CO2: 28 mmol/L (ref 22–32)
Calcium: 8.7 mg/dL — ABNORMAL LOW (ref 8.9–10.3)
Chloride: 95 mmol/L — ABNORMAL LOW (ref 98–111)
Creatinine, Ser: 5.13 mg/dL — ABNORMAL HIGH (ref 0.44–1.00)
GFR, Estimated: 9 mL/min — ABNORMAL LOW (ref >60)
Glucose, Bld: 165 mg/dL — ABNORMAL HIGH (ref 70–99)
Potassium: 4.1 mmol/L (ref 3.5–5.1)
Sodium: 134 mmol/L — ABNORMAL LOW (ref 135–145)
Total Bilirubin: 0.8 mg/dL (ref 0.3–1.2)
Total Protein: 7 g/dL (ref 6.5–8.1)

## 2020-12-18 LAB — CBC WITH DIFFERENTIAL/PLATELET
Abs Immature Granulocytes: 0.02 10*3/uL (ref 0.00–0.07)
Basophils Absolute: 0 10*3/uL (ref 0.0–0.1)
Basophils Relative: 0 %
Eosinophils Absolute: 0.1 10*3/uL (ref 0.0–0.5)
Eosinophils Relative: 1 %
HCT: 36 % (ref 36.0–46.0)
Hemoglobin: 11.1 g/dL — ABNORMAL LOW (ref 12.0–15.0)
Immature Granulocytes: 0 %
Lymphocytes Relative: 25 %
Lymphs Abs: 1.2 10*3/uL (ref 0.7–4.0)
MCH: 28 pg (ref 26.0–34.0)
MCHC: 30.8 g/dL (ref 30.0–36.0)
MCV: 90.7 fL (ref 80.0–100.0)
Monocytes Absolute: 0.5 10*3/uL (ref 0.1–1.0)
Monocytes Relative: 10 %
Neutro Abs: 3.2 10*3/uL (ref 1.7–7.7)
Neutrophils Relative %: 64 %
Platelets: 133 10*3/uL — ABNORMAL LOW (ref 150–400)
RBC: 3.97 MIL/uL (ref 3.87–5.11)
RDW: 14.8 % (ref 11.5–15.5)
WBC: 5 10*3/uL (ref 4.0–10.5)
nRBC: 0 % (ref 0.0–0.2)

## 2020-12-18 LAB — CBG MONITORING, ED: Glucose-Capillary: 167 mg/dL — ABNORMAL HIGH (ref 70–99)

## 2020-12-18 NOTE — Discharge Instructions (Addendum)
You have been evaluated in the Emergency Department. You are Covid positive.  You are to isolate and quarantine yourself.  Please refer to the information attached with this packet.  Treat yourself symptomatically with over the counter medication as you would for a viral illness.  Take Tylenol and Ibuprofen for pain and fever control. Stay well hydrated. If you have any worsening or severe symptoms, difficulty breathing, concern for your health please return to the emergency department.  Your dialysis center was contacted, they recommend that you show up at your dialysis session on Saturday.  Please give them a call before going into the building as you will need to use the isolation room because you are COVID-positive.

## 2020-12-18 NOTE — ED Notes (Signed)
Checked patient cbg it was 73 notified RN of blood sugar

## 2020-12-18 NOTE — ED Triage Notes (Addendum)
  Patient presents to the ED by EMS with c/o Weakness when getting ready for dialysis today, she has had a fever related to covid. Tested positive on Tuesday. A/oX4 residual right side deficits along aphasia hx stroke.

## 2020-12-18 NOTE — ED Notes (Signed)
Patient Alert and oriented to baseline. Stable and ambulatory to baseline. Patient verbalized understanding of the discharge instructions.  Patient belongings were taken by the patient.   

## 2020-12-18 NOTE — ED Notes (Signed)
Spoke with the patients daughter who was given an update. Sprite given to the patient.    EDP is now at the bedside.

## 2020-12-18 NOTE — ED Provider Notes (Signed)
[facility] EMERGENCY DEPARTMENT Provider Note   CSN: [id number] Arrival date & time: 12/18/20  0938     History Chief Complaint  Patient presents with   Covid Positive   Weakness   Fever    Hannah [name] is a 55 y.o. female.  HPI  55 year old female with past medical history of end-stage renal disease on hemodialysis, previous CVA, HTN, HLD, MS who is reported to be known COVID-positive presents emergency department with weakness.  Patient states that she felt very weak when she was getting ready for dialysis today.  She is complaining of a productive cough, nausea, headache and severe myalgias.  She states that she has had fevers up to 102 at home.  Patient has been having sick symptoms for a little over a week, tested COVID-positive 3 days ago.  Past Medical History:  Diagnosis Date   Cerebral infarction ([place]) 07/12/2017   2009 first one, has had a total of 4 stokes   Chronic kidney disease    tues thurs sat - dialysis   Diabetes type 2, controlled ([place])    Hemiparesis affecting right side as late effect of cerebrovascular accident (CVA) ([place])    Hyperlipidemia    Hypertension    Multiple sclerosis ([place]) 2009   Stroke [facility])    Walker as ambulation aid     Patient Active Problem List   Diagnosis Date Noted   Past heart attack 11/13/2020   Spastic hemiplegia due to cerebrovascular disease ([place]) 11/03/2020   Debility 10/18/2020   ESRD on hemodialysis (HCC)    SOB (shortness of breath) 09/27/2020   Volume overload 09/26/2020   CKD (chronic kidney disease) stage 5, GFR less than 15 ml/min ([place]) 09/13/2020   H/O: CVA (cerebrovascular accident) 09/12/2020   Acute blood loss anemia    Chronic kidney disease (CKD), stage IV (severe) ([place])    Uncontrolled type 2 diabetes mellitus with hyperglycemia ([place])    Essential hypertension    Right pontine cerebrovascular accident ([place]) 06/12/2020   Stroke-like symptoms 06/09/2020   Acute kidney injury superimposed  on CKD ([place]) 06/09/2020   Cerebral thrombosis with cerebral infarction 06/09/2020   Hypertension    Hyperlipidemia    Diabetes type 2, controlled ([place])    Bimalleolar ankle fracture 11/24/2018   Lower abdominal pain 07/28/2017   Bladder mass 07/18/2017   Painless hematuria 07/18/2017   Intractable vomiting with nausea 07/12/2017   Hemiparesis affecting right side as late effect of cerebrovascular accident (CVA) ([place]) 09/22/2016   AKI (acute kidney injury) ([place]) 01/08/2016   CAD (coronary artery disease) 01/07/2016   Multiple sclerosis ([place]) 2009    Past Surgical History:  Procedure Laterality Date   APPENDECTOMY     AV FISTULA PLACEMENT Right 10/02/2020   Procedure: RIGHT ARTERIOVENOUS (AV) FISTULA CREATION;  Surgeon: Waynetta Sandy, MD;  Location: [place];  Service: Vascular;  Laterality: Right;   [place] Right 11/24/2020   Procedure: SECOND STAGE RIGHT ARM [place];  Surgeon: Marty Heck, MD;  Location: [place];  Service: Vascular;  Laterality: Right;   CYSTOSTOMY W/ BLADDER BIOPSY     IR FLUORO GUIDE CV LINE RIGHT  09/29/2020   IR US GUIDE VASC ACCESS RIGHT  09/29/2020   OVARIAN CYST SURGERY     stent placed in heart       OB History   No obstetric history on file.     History reviewed. No pertinent family history.  Social History   Tobacco  Use   Smoking status: Never   Smokeless tobacco: Never  Vaping Use   Vaping Use: Never used  Substance Use Topics   Alcohol use: Not Currently   Drug use: Never    Home Medications Prior to Admission medications   Medication Sig Start Date End Date Taking? Authorizing Provider  amLODipine (NORVASC) 10 MG tablet Take 1 tablet (10 mg total) by mouth daily. 11/03/20  Yes Love, Ivan Anchors, PA-C  calcium acetate (PHOSLO) 667 MG capsule Take 667 mg by mouth See admin instructions. Take 667 mg by mouth with each meal and snacks 11/11/20  Yes [provider]  carvedilol  (COREG) 25 MG tablet Take 1 tablet (25 mg total) by mouth 2 (two) times daily with a meal. 11/03/20  Yes Love, Ivan Anchors, PA-C  clopidogrel (PLAVIX) 75 MG tablet Take 1 tablet (75 mg total) by mouth daily. 11/03/20  Yes Love, Ivan Anchors, PA-C  dantrolene (DANTRIUM) 50 MG capsule TAKE 1 CAPSULE BY MOUTH THREE TIMES DAILY 12/17/20  Yes Lovorn, Jinny Blossom, MD  diclofenac Sodium (VOLTAREN) 1 % GEL Apply 2 g topically 4 (four) times daily. To leg Patient taking differently: Apply 2 g topically 4 (four) times daily as needed (pain). To leg 11/03/20  Yes Love, Ivan Anchors, PA-C  famotidine (PEPCID) 20 MG tablet Take 1 tablet (20 mg total) by mouth daily. 11/03/20  Yes Love, Ivan Anchors, PA-C  gabapentin (NEURONTIN) 300 MG capsule Take 1 capsule (300 mg total) by mouth at bedtime. 12/16/20  Yes Marty Heck, MD  hydrALAZINE (APRESOLINE) 50 MG tablet Take 1 tablet (50 mg total) by mouth every 8 (eight) hours. 11/03/20  Yes Love, Ivan Anchors, PA-C  hydrOXYzine (ATARAX/VISTARIL) 10 MG tablet Take 1 tablet (10 mg total) by mouth 3 (three) times daily as needed for itching. 12/16/20  Yes Marty Heck, MD  insulin degludec (TRESIBA FLEXTOUCH) 100 UNIT/ML FlexTouch Pen Inject 12 Units into the skin at bedtime. 11/03/20  Yes Love, Ivan Anchors, PA-C  insulin lispro (HUMALOG) 100 UNIT/ML KwikPen Inject 5 Units into the skin 3 (three) times daily. 11/04/20  Yes Love, Ivan Anchors, PA-C  melatonin 5 MG TABS Take 5 mg by mouth at bedtime.   Yes [provider]  nitroGLYCERIN (NITROSTAT) 0.4 MG SL tablet Place 1 tablet (0.4 mg total) under the tongue every 5 (five) minutes as needed for chest pain. 06/25/20  Yes Angiulli, Lavon Paganini, PA-C  oxyCODONE-acetaminophen (PERCOCET/ROXICET) 5-325 MG tablet Take 1 tablet by mouth every 4 (four) hours as needed for severe pain (Patient taking differently, states that she is taking only 1/2 tablet). 12/16/20  Yes Marty Heck, MD  senna-docusate (SENOKOT-S) 8.6-50 MG tablet Take 1 tablet by  mouth 2 (two) times daily as needed for moderate constipation. 11/03/20  Yes Love, Ivan Anchors, PA-C  tiZANidine (ZANAFLEX) 2 MG tablet Take 1 tablet (2 mg total) by mouth at bedtime. 11/03/20  Yes Love, Ivan Anchors, PA-C  HYDROcodone-acetaminophen (NORCO/VICODIN) 5-325 MG tablet Take 1 tablet by mouth every 4 (four) hours as needed for moderate pain. Patient not taking: No sig reported 11/24/20 11/24/21  Marty Heck, MD  polyethylene glycol (MIRALAX / GLYCOLAX) 17 g packet Take 17 g by mouth daily. Patient taking differently: Take 17 g by mouth daily as needed for moderate constipation. 11/04/20   Love, Ivan Anchors, PA-C    Allergies    Ibuprofen, Influenza vaccines, Pneumococcal vaccine, Promethazine, Ambien [zolpidem], Cyclobenzaprine, Tape, Tramadol hcl, and Oxycodone  Review of Systems  Review of Systems  Constitutional:  Positive for chills, fatigue and fever.  HENT:  Negative for congestion.   Eyes:  Negative for visual disturbance.  Respiratory:  Positive for cough and shortness of breath.   Cardiovascular:  Negative for chest pain and palpitations.  Gastrointestinal:  Positive for nausea. Negative for abdominal pain, diarrhea and vomiting.  Genitourinary:  Negative for dysuria.  Musculoskeletal:  Positive for myalgias.  Skin:  Negative for rash.  Neurological:  Negative for headaches.   Physical Exam Updated Vital Signs BP (!) 152/69 (BP Location: Left Arm)   Pulse 93   Resp 14   Ht '5\' 2"'$  (1.575 m)   Wt 55.3 kg   SpO2 95%   BMI 22.31 kg/m   Physical Exam Vitals and nursing note reviewed.  Constitutional:      Appearance: Normal appearance. She is not diaphoretic.     Comments: Fatigued appearing  HENT:     Head: Normocephalic.     Mouth/Throat:     Mouth: Mucous membranes are moist.  Cardiovascular:     Rate and Rhythm: Normal rate.  Pulmonary:     Effort: Pulmonary effort is normal. No respiratory distress.     Breath sounds: Rales present.  Abdominal:      Palpations: Abdomen is soft.     Tenderness: There is no abdominal tenderness. There is no guarding.  Skin:    General: Skin is warm.  Neurological:     Mental Status: She is alert and oriented to person, place, and time. Mental status is at baseline.  Psychiatric:        Mood and Affect: Mood normal.    ED Results / Procedures / Treatments   Labs (all labs ordered are listed, but only abnormal results are displayed) Labs Reviewed  COMPREHENSIVE METABOLIC PANEL  CBC WITH DIFFERENTIAL/PLATELET  URINALYSIS, ROUTINE W REFLEX MICROSCOPIC    EKG None  Radiology DG Chest Portable 1 View  Result Date: 12/18/2020 CLINICAL DATA:  Cough, fever, COVID positive EXAM: PORTABLE CHEST 1 VIEW COMPARISON:  10/03/2020 FINDINGS: Cardiomegaly. Large-bore right neck multi lumen vascular catheter. Both lungs are clear. The visualized skeletal structures are unremarkable. IMPRESSION: Cardiomegaly without acute abnormality of the lungs in AP portable projection. Electronically Signed   By: Eddie Candle M.D.   On: 12/18/2020 10:55    Procedures Procedures   Medications Ordered in ED Medications - No data to display  ED Course  I have reviewed the triage vital signs and the nursing notes.  Pertinent labs & imaging results that were available during my care of the patient were reviewed by me and considered in my medical decision making (see chart for details).    MDM Rules/Calculators/A&P                           55 year old female presents emergency department generalized weakness.  She is noted to be COVID-positive as of 2 days ago.  She is complaining of myalgias, fatigue, intermittently productive cough and fevers at home.  She is afebrile here, stable vitals.  She missed her dialysis this morning to come here for evaluation.  Blood work is reassuring, baseline and expected predialysis.  Electrolytes are normal.  Chest x-ray shows mild cardiomegaly without any acute pulmonary vascular  congestion.  She is not short of breath or hypoxic on exam, does not display any symptoms of acute fluid overload.  On reevaluation patient feels improved, she is hoping to be discharged  home.  I contacted her dialysis center who was unable to fit her in for dialysis later today, they recommend that she follows up on Saturday at her scheduled appointment.  Given her evaluation today I do not think she needs emergent dialysis and outpatient follow-up for that is appropriate.  Educated the patient on [place] treatment and expectations.  She is out of the window for any COVID treatment.  Patient at this time appears safe and stable for discharge and will be treated as an outpatient.  Discharge plan and strict return to ED precautions discussed, patient verbalizes understanding and agreement.  Final Clinical Impression(s) / ED Diagnoses Final diagnoses:  None    Rx / DC Orders ED Discharge Orders     None        Lorelle Gibbs, DO 12/18/20 1540

## 2020-12-18 NOTE — Progress Notes (Signed)
Patient missed HD in order to come to ED for COVID 19/weakness. Labs and chest xray unremarkable. Called Clinic Manager at her dialysis center. If not admitted, she can come to dialysis off schedule tomorrow, must be in unit by 1130 AM for treatment.    Hannah [name] [facility] [phone]

## 2020-12-22 ENCOUNTER — Other Ambulatory Visit: Payer: Self-pay | Admitting: *Deleted

## 2020-12-22 NOTE — Patient Outreach (Signed)
[facility]) Care Management  12/22/2020  Feige Krutz August 20, 1965 [id number]   Telephone Assessment    Subjective: Unsuccessful outreach call to patient and to daughter [name] contact numbers, no answer able to leave a HIPAA compliant voicemail message for return call     Plan Will await return call, If no return call will plan outreach call within a week.    Joylene Draft, RN, BSN  [place] Management Coordinator  [phone]- Mobile [phone]- Toll Free Main Office

## 2020-12-29 ENCOUNTER — Ambulatory Visit: Payer: Medicare Other

## 2020-12-31 ENCOUNTER — Other Ambulatory Visit: Payer: Self-pay | Admitting: *Deleted

## 2020-12-31 NOTE — Patient Outreach (Signed)
[facility]) Care Management  12/31/2020  Hannah [name] 11-02-1965 [id number]   Telephone assessment    Referral Date : 620/22 Referral source: [facility] hospital liasion Referral reason : Complex care and disease management follow up call, assess for further needs. Insurance: [employer]    Subjective: Outreach call to patient , no answer able to leave a HIPAA compliant voice  Successful return call from  patient,spoke with her daughter Hannah [name] that stays with her during the day, she reports patient having difficulty with her phone on today. Patient discussed still being weak from recent covid 19 testing positive on 8/2. Patient tolerates out of bed to bsc, requires family support getting up and all personal care. Patient reports appetite is slowly improving, denies fever or shortness of breath or worsening cough, daughter reports patient still testing positive for covid 3 days ago , they plan to test again on tomorrow. Patient continues to attend hemodialysis session family taking to appointments instead of using [facility] transportation.  Patient daughter discussed checking into personal care assistance  and she understanding  insurance does not cover, patient does not have medicaid. Discussed benefits of home health therapy , Hannah [name]  discussed that patient does not want others in the home  at this point , patient is agreeable to discuss later on.  Patient reports some improvement in discomfort at right arm since recent vascular procedure.     Goals Addressed             This Visit's Progress    Follow My Treatment Plan-Chronic Kidney   On track    Timeframe:  Long-Range Goal Priority:  Medium Start Date:    11/04/20                         Expected End Date:  03/16/21                   Follow Up Date 01/21/21 Barriers: Knowledge    - keep follow-up appointments - keep taking my medicines, even when I feel good    Why is this important?   Staying as  healthy as you can is very important. This may mean making changes if you smoke, don't exercise or eat poorly.  A healthy lifestyle is an important goal for you.  Following the treatment plan and making changes may be hard.  Try some of these steps to help keep the disease from getting worse.     Notes:  12/31/20 Acknowledging attending all HD session,family providing transportation instead of use of [facility] [place] for now due to patient recent covid .  7/27 reports Hemodialysis treatment going great, attending all sessions  Attending Hemodialysis treatments as scheduled.      Improve My Nutrition with the Foods I Eat and Drink   On track    Timeframe:  Long-Range Goal Priority:  Medium Start Date:    11/04/20                        Expected End Date:02/13/21                    Follow Up Date : 01/21/21   - choose small, frequent meals - include protein food, like egg, meat, chicken, peanut butter, beans and fish in each meal    Why is this important?   Eating 3 healthy meals, or 5 or 6 small meals, a day is  very important.  A healthy diet is one that has fruit, vegetables, protein, good fats, whole grains and low-fat dairy products.  Be sure to have plenty of protein that builds strong muscles. Many older people do not get quite enough protein.     Barriers: Knowledge   Notes:  8/17 verified improvement with appetite and intake, family assisting with meal prep and variety of foods understanding importance of nutrition. 7/27 patient acknowledged drinking nutrition supplement at HD session even though she does not like, reinforced importance of nutrition .  Patient acknowledges improved appetite and intake, fluid restriction limits.      Make and Keep All Appointments   On track    Timeframe:  Long-Range Goal Priority:  Medium Start Date:  11/04/20                          Expected End Date:  03/16/21                     Follow Up Date : 01/21/21   - keep a calendar with appointment  dates    Why is this important?   Part of staying healthy is seeing the doctor for follow-up care.  If you forget your appointments, there are some things you can do to stay on track.  Barriers: None  Notes: 8/17 reviewed upcoming appointment with vascular MD, and Rehab . Encouraged PCP follow up after ED visit for weakness due to covid 19. Patient agrees has improved some since then but still weak.  7/27 Commended for scheduling and attending post discharge visit with PCP. Reinforced follow up with vascular MD regarding pain from recent procedure right arm  Reviewed upcoming appointment with Vascular clinic provided contact number and date/time of appointment . Discussed transportation with [facility] [place] or family member available. Daughter to schedule post dc visit with PCP .      Monitor and Manage My Blood Sugar-Diabetes Type 2   On track    Timeframe:  Long-Range Goal Priority:  Medium Start Date:   11/04/20                          Expected End Date: 01/13/21                   Follow Up Date 12/21/20 Barriers: Health Behaviors Knowledge    - check blood sugar at prescribed times - check blood sugar if I feel it is too high or too low - take the blood sugar log to all doctor visits - take the blood sugar meter to all doctor visits    Why is this important?   Checking your blood sugar at home helps to keep it from getting very high or very low.  Writing the results in a diary or log helps the doctor know how to care for you.  Your blood sugar log should have the time, date and the results.  Also, write down the amount of insulin or other medicine that you take.  Other information, like what you ate, exercise done and how you were feeling, will also be helpful.     Notes: 12/31/20 Patient checking blood sugar with device , reading this am 160, family assist with insulin therapy. Denies recent low blood sugar readings.  7/27 , Denies recent hypoglycemia , Goal restart : no recent  hypoglycemia episodes, checking readings at least 4 times daily with freestyle  libre and verifies with finger sticks. Readings in 120-170.     Set My Target A1C-Diabetes Type 2   On track    Timeframe:  Long-Range Goal Priority:  Medium Start Date:  11/04/20                           Expected End Date:  03/16/21                   Follow Up Date 01/21/21   - set target A1C    Why is this important?   Your target A1C is decided together by you and your doctor.  It is based on several things like your age and other health issues.   Barriers: Knowledge  Goal restart  Note: 7/27 recent Alc at PCP visit 7.3% improved from 8.7% on 08/21/20 Reviewed goal A1c and her recent A1c, encouraged to discuss with provider at visit            Plan Patient agreeable to plan and follow outreach in the next month.    Joylene Draft, RN, BSN  [place] Management Coordinator  [phone]- Mobile [phone]- Toll Free Main Office

## 2021-01-05 ENCOUNTER — Ambulatory Visit (INDEPENDENT_AMBULATORY_CARE_PROVIDER_SITE_OTHER): Payer: Medicare Other | Admitting: Physician Assistant

## 2021-01-05 VITALS — BP 132/69 | HR 82 | Temp 97.0°F | Ht 62.0 in | Wt 122.0 lb

## 2021-01-05 DIAGNOSIS — N186 End stage renal disease: Secondary | ICD-10-CM

## 2021-01-05 DIAGNOSIS — Z992 Dependence on renal dialysis: Secondary | ICD-10-CM

## 2021-01-05 NOTE — Progress Notes (Signed)
POST OPERATIVE DIALYSIS ACCESS OFFICE NOTE    CC:  F/u for dialysis access surgery  HPI:  This is a 55 y.o. female who is s/p right second stage basilic vein transposition by Dr. Carlis Abbott on 11/24/2020.  She returns today with her daughter for follow-up evaluation of right elbow and lateral forearm pain. She describes the pain and burning, tingling and some numbness of the skin. States increasing gabapentin dose has not helped. She says it is similar to when she had a hematoma of the right groin and she developed right thigh pain from this. She denies hand pain or loss of hand agility.  She has a right IJ TDC in place.  She has history of stroke with right extremity weakness and history of multiple sclerosis.   Dialysis days:  TTS   Dialysis center:  [facility]  Allergies  Allergen Reactions   Ibuprofen Swelling    Swelling to hands, feet and face    Influenza Vaccines Anaphylaxis and Swelling    fever   Pneumococcal Vaccine Anaphylaxis and Swelling    fever   Promethazine Other (See Comments)    hallucinations   Ambien [Zolpidem]     Other reaction(s): Unknown   Cyclobenzaprine Other (See Comments)    hallucinations    Tape Itching   Tramadol Hcl Other (See Comments)    hallucinations   Oxycodone Nausea And Vomiting    Current Outpatient Medications  Medication Sig Dispense Refill   amLODipine (NORVASC) 10 MG tablet Take 1 tablet (10 mg total) by mouth daily. 30 tablet 0   calcium acetate (PHOSLO) 667 MG capsule Take 667 mg by mouth See admin instructions. Take 667 mg by mouth with each meal and snacks     carvedilol (COREG) 25 MG tablet Take 1 tablet (25 mg total) by mouth 2 (two) times daily with a meal. 60 tablet 0   clopidogrel (PLAVIX) 75 MG tablet Take 1 tablet (75 mg total) by mouth daily. 30 tablet 0   dantrolene (DANTRIUM) 50 MG capsule TAKE 1 CAPSULE BY MOUTH THREE TIMES DAILY 90 capsule 0   diclofenac Sodium (VOLTAREN) 1 % GEL Apply 2 g topically 4 (four) times  daily. To leg (Patient taking differently: Apply 2 g topically 4 (four) times daily as needed (pain). To leg) 350 g 0   famotidine (PEPCID) 20 MG tablet Take 1 tablet (20 mg total) by mouth daily. 30 tablet 0   gabapentin (NEURONTIN) 300 MG capsule Take 1 capsule (300 mg total) by mouth at bedtime. 30 capsule 2   hydrALAZINE (APRESOLINE) 50 MG tablet Take 1 tablet (50 mg total) by mouth every 8 (eight) hours. 90 tablet 0   HYDROcodone-acetaminophen (NORCO/VICODIN) 5-325 MG tablet Take 1 tablet by mouth every 4 (four) hours as needed for moderate pain. (Patient not taking: No sig reported) 20 tablet 0   hydrOXYzine (ATARAX/VISTARIL) 10 MG tablet Take 1 tablet (10 mg total) by mouth 3 (three) times daily as needed for itching. 30 tablet 0   insulin degludec (TRESIBA FLEXTOUCH) 100 UNIT/ML FlexTouch Pen Inject 12 Units into the skin at bedtime. 15 mL 0   insulin lispro (HUMALOG) 100 UNIT/ML KwikPen Inject 5 Units into the skin 3 (three) times daily. 15 mL 0   melatonin 5 MG TABS Take 5 mg by mouth at bedtime.     nitroGLYCERIN (NITROSTAT) 0.4 MG SL tablet Place 1 tablet (0.4 mg total) under the tongue every 5 (five) minutes as needed for chest pain. 30 tablet 12  oxyCODONE-acetaminophen (PERCOCET/ROXICET) 5-325 MG tablet Take 1 tablet by mouth every 4 (four) hours as needed for severe pain (Patient taking differently, states that she is taking only 1/2 tablet). 25 tablet 0   polyethylene glycol (MIRALAX / GLYCOLAX) 17 g packet Take 17 g by mouth daily. (Patient taking differently: Take 17 g by mouth daily as needed for moderate constipation.) 30 each 0   senna-docusate (SENOKOT-S) 8.6-50 MG tablet Take 1 tablet by mouth 2 (two) times daily as needed for moderate constipation. 60 tablet 0   tiZANidine (ZANAFLEX) 2 MG tablet Take 1 tablet (2 mg total) by mouth at bedtime. 30 tablet 0   No current facility-administered medications for this visit.     ROS:  See HPI  Physical Exam:  General  appearance: awake, alert in NAD Chest: catheter site dressing dry and intact. Respirations: unlabored; no dyspnea at rest (left/right) upper extremity: Hand is warm with 3-4/5 grip strength. Motor function and sensation intact. Good bruit and thrill in fistula. 2+ radial pulse, 1+ ulnar pulse Incision(s): Well approximated.    Assessment/Plan:   The pain distribution and other factors point away from steal syndrome. Doubtful at there is any hematoma contributing to pain as she is almost 6 weeks post-op. [place]ill obtain ultrasound of right UE and fistula and see her back in 2-3 weeks. Also recommend referral to her neurologist for evaluation. They are in agreement with this plan.  Barbie Banner, PA-C 01/05/2021 2:06 PM [facility] [phone]  Clinic MD:  Marliss Czar

## 2021-01-07 ENCOUNTER — Other Ambulatory Visit: Payer: Self-pay

## 2021-01-07 DIAGNOSIS — N186 End stage renal disease: Secondary | ICD-10-CM

## 2021-01-09 ENCOUNTER — Encounter
Payer: Medicare Other | Attending: Physical Medicine and Rehabilitation | Admitting: Physical Medicine and Rehabilitation

## 2021-01-09 ENCOUNTER — Encounter: Payer: Self-pay | Admitting: Physical Medicine and Rehabilitation

## 2021-01-09 ENCOUNTER — Other Ambulatory Visit: Payer: Self-pay

## 2021-01-09 VITALS — BP 152/73 | HR 81 | Ht 62.0 in

## 2021-01-09 DIAGNOSIS — G8928 Other chronic postprocedural pain: Secondary | ICD-10-CM | POA: Diagnosis present

## 2021-01-09 DIAGNOSIS — Z5181 Encounter for therapeutic drug level monitoring: Secondary | ICD-10-CM

## 2021-01-09 DIAGNOSIS — Z992 Dependence on renal dialysis: Secondary | ICD-10-CM | POA: Insufficient documentation

## 2021-01-09 DIAGNOSIS — I679 Cerebrovascular disease, unspecified: Secondary | ICD-10-CM

## 2021-01-09 DIAGNOSIS — G894 Chronic pain syndrome: Secondary | ICD-10-CM | POA: Diagnosis not present

## 2021-01-09 DIAGNOSIS — Z79899 Other long term (current) drug therapy: Secondary | ICD-10-CM | POA: Insufficient documentation

## 2021-01-09 DIAGNOSIS — G8110 Spastic hemiplegia affecting unspecified side: Secondary | ICD-10-CM

## 2021-01-09 DIAGNOSIS — N186 End stage renal disease: Secondary | ICD-10-CM | POA: Diagnosis not present

## 2021-01-09 DIAGNOSIS — R5381 Other malaise: Secondary | ICD-10-CM | POA: Insufficient documentation

## 2021-01-09 MED ORDER — DANTROLENE SODIUM 100 MG PO CAPS: 100.0000 mg | ORAL_CAPSULE | Freq: Two times a day (BID) | ORAL | 5 refills | Status: DC

## 2021-01-09 MED ORDER — METHADONE HCL 5 MG PO TABS: 5.0000 mg | ORAL_TABLET | Freq: Two times a day (BID) | ORAL | 0 refills | Status: DC

## 2021-01-09 NOTE — Addendum Note (Signed)
Addended by: Franchot Gallo on: 01/09/2021 03:27 PM   Modules accepted: Orders

## 2021-01-09 NOTE — Patient Instructions (Signed)
Pt is a 55 yr old female with hx of ESRD on HD M/W/F, and hx of debility and spasticity due to remote stroke- R R hemiparesis and foot drop.  Here for hospital f/u.  Methadone- 5 mg 2x/day- for nerve pain/10/10 pain- can try 2.5 mg 2x/day but don't go higher than 5 mg 2x/day until I increase it- Suggest first 7 days, getting with cash/not insurance, and then will get prior auth by then.   2.  Already on Gabapentin and Cymbalta and cannot take Lyrica due ot swelling- is also on max dose of Gabapentin  -max dose for ESRD and tried Tramadol and Percocet  (also made itching MUCH worse and N/V) without good response- allergic to both. Due ot having NERVE pain- not traditional nociceptive pain.   3. Spasticity- Dantrolene-last labs 8/4- LFTs look great- will increase Dantrolene to 100 mg 2x/day for tightness and spasms of RUE/RLE. And I will check labs in 3 months.   4. Opiate contract and Oral drug screen  5.   F/U in 4-6 weeks- wait list- and then also make in 3 months-

## 2021-01-09 NOTE — Progress Notes (Signed)
Subjective:    Patient ID: Hannah [name], female    DOB: 11-Jan-1966, 55 y.o.   MRN: [id number]  HPI Pt is a 55 yr old female with hx of ESRD on HD M/W/F, and hx of debility and spasticity due to remote stroke- R R hemiparesis and foot drop.  Here for hospital f/u.   Did surgery for fistula 3 weeks ago.  Having excruciating burning pain.   Ever since surgery- 10/10 pain- starts at R elbow abd down into forearm to R wrist.   Started on Percocet- List of allergies are long- tramadol- not working either- and percocet with hydroxyine together.  No side effects from percocet except itching.     Had COVID beginning of August- got SO weak since then- can hardly move RUE/RLE and actually weak on L side as well.   Still taking Dantrolene- 50 mg TID-    Not eating due to pain and having more Nausea overall last few days.   Pain Inventory Average Pain 10 Pain Right Now 10 My pain is constant  LOCATION OF PAIN  RUE- due to fistula surgery  BOWEL Number of stools per week: 1x/week Oral laxative use Yes  Type of laxative miralax, asenokot Enema or suppository use No  History of colostomy No  Incontinent No   BLADDER Normal In and out cath, frequency no Able to self cath  doesn't cath Bladder incontinence No  Frequent urination Yes  Leakage with coughing No  Difficulty starting stream No  Incomplete bladder emptying No    Mobility use a wheelchair hasn't walked since COVID  Function disabled: date disabled 2009  Neuro/Psych weakness tingling trouble walking spasms depression anxiety  Prior Studies Any changes since last visit?  yes  Physicians involved in your care Dr Jacelyn Grip- PCP   History reviewed. No pertinent family history. Social History   Socioeconomic History   Marital status: Divorced    Spouse name: Not on file   Number of children: Not on file   Years of education: Not on file   Highest education level: Not on file  Occupational History    Occupation: disabled  Tobacco Use   Smoking status: Never   Smokeless tobacco: Never  Vaping Use   Vaping Use: Never used  Substance and Sexual Activity   Alcohol use: Not Currently   Drug use: Never   Sexual activity: Not on file  Other Topics Concern   Not on file  Social History Narrative   Not on file   Social Determinants of Health   Financial Resource Strain: Not on file  Food Insecurity: No Food Insecurity   Worried About Running Out of Food in the Last Year: Never true   [place] in the Last Year: Never true  Transportation Needs: No Transportation Needs   Lack of Transportation (Medical): No   Lack of Transportation (Non-Medical): No  Physical Activity: Not on file  Stress: Not on file  Social Connections: Not on file   Past Surgical History:  Procedure Laterality Date   APPENDECTOMY     AV FISTULA PLACEMENT Right 10/02/2020   Procedure: RIGHT ARTERIOVENOUS (AV) FISTULA CREATION;  Surgeon: Waynetta Sandy, MD;  Location: [facility];  Service: Vascular;  Laterality: Right;   [place] Right 11/24/2020   Procedure: SECOND STAGE RIGHT ARM [place];  Surgeon: Marty Heck, MD;  Location: [facility];  Service: Vascular;  Laterality: Right;   CYSTOSTOMY W/ BLADDER BIOPSY  IR FLUORO GUIDE CV LINE RIGHT  09/29/2020   IR US GUIDE VASC ACCESS RIGHT  09/29/2020   OVARIAN CYST SURGERY     stent placed in heart     Past Medical History:  Diagnosis Date   Cerebral infarction ([place]) 07/12/2017   2009 first one, has had a total of 4 stokes   Chronic kidney disease    tues thurs sat - dialysis   Diabetes type 2, controlled ([place])    Hemiparesis affecting right side as late effect of cerebrovascular accident (CVA) ([place])    Hyperlipidemia    Hypertension    Multiple sclerosis ([place]) 2009   Stroke [facility])    Walker as ambulation aid    BP (!) 152/73   Pulse 81   Ht '5\' 2"'$  (1.575 m)   SpO2 95%   BMI 22.31 kg/m   Opioid  Risk Score:   Fall Risk Score:  `1  Depression screen PHQ 2/9  Depression screen [facility] 2/9 01/09/2021 11/10/2020  Decreased Interest 0 0  Down, Depressed, Hopeless 0 1  PHQ - 2 Score 0 1  Altered sleeping 0 -  Tired, decreased energy 0 -  Change in appetite 0 -  Feeling bad or failure about yourself  2 -  Trouble concentrating 0 -  Moving slowly or fidgety/restless 0 -  Suicidal thoughts 0 -  PHQ-9 Score 2 -    Review of Systems    An entire ROS was completed and found to be negative except for HPI.  Objective:   Physical Exam Awake, alert, appropriate, crying ful out multiple times, accompanied by daughter, in manual w/c- doesn't have RUE arm trough, NAD Neuro: 4-5 beats clonus on RLE and homffan''s in RUE MAS of at least 2in RUE and 1+ in RLE   MS: VERY TTP with light touch on RUE- any touch sends her into spasms/crying Will not test strength today      Assessment & Plan:    Pt is a 55 yr old female with hx of ESRD on HD M/W/F, and hx of debility and spasticity due to remote stroke- R R hemiparesis and foot drop.  Here for hospital f/u.  Methadone- 5 mg 2x/day- for nerve pain/10/10 pain- can try 2.5 mg 2x/day but don't go higher than 5 mg 2x/day until I increase it- Suggest first 7 days, getting with cash/not insurance, and then will get prior auth by then.   2.  Already on Gabapentin and Cymbalta and cannot take Lyrica due ot swelling- is also on max dose of Gabapentin  -max dose for ESRD and tried Tramadol and Percocet  (also made itching MUCH worse and N/V) without good response- allergic to both. Due ot having NERVE pain- not traditional nociceptive pain.   3. Spasticity- Dantrolene-last labs 8/4- LFTs look great- will increase Dantrolene to 100 mg 2x/day for tightness and spasms of RUE/RLE. And I will check labs in 3 months.   4. Opiate contract and Oral drug screen  5.   F/U in 4-6 weeks- wait list- and then also make in 3 months-   I spent a total of 32 minutes  on visit- discussing risks/benefit for pain control, opiate contract, etc; and spasticity control

## 2021-01-11 ENCOUNTER — Encounter (HOSPITAL_COMMUNITY): Payer: Self-pay | Admitting: Emergency Medicine

## 2021-01-11 ENCOUNTER — Emergency Department (HOSPITAL_COMMUNITY)
Admission: EM | Admit: 2021-01-11 | Discharge: 2021-01-11 | Disposition: A | Discharge status: Home / Self Care | Payer: Medicare Other | Attending: Emergency Medicine | Admitting: Emergency Medicine

## 2021-01-11 ENCOUNTER — Emergency Department (HOSPITAL_COMMUNITY): Payer: Medicare Other

## 2021-01-11 DIAGNOSIS — N186 End stage renal disease: Secondary | ICD-10-CM | POA: Insufficient documentation

## 2021-01-11 DIAGNOSIS — Z79899 Other long term (current) drug therapy: Secondary | ICD-10-CM | POA: Diagnosis not present

## 2021-01-11 DIAGNOSIS — R531 Weakness: Secondary | ICD-10-CM | POA: Diagnosis present

## 2021-01-11 DIAGNOSIS — I120 Hypertensive chronic kidney disease with stage 5 chronic kidney disease or end stage renal disease: Secondary | ICD-10-CM | POA: Diagnosis not present

## 2021-01-11 DIAGNOSIS — Z794 Long term (current) use of insulin: Secondary | ICD-10-CM | POA: Insufficient documentation

## 2021-01-11 DIAGNOSIS — E1122 Type 2 diabetes mellitus with diabetic chronic kidney disease: Secondary | ICD-10-CM | POA: Diagnosis not present

## 2021-01-11 DIAGNOSIS — I2510 Atherosclerotic heart disease of native coronary artery without angina pectoris: Secondary | ICD-10-CM | POA: Insufficient documentation

## 2021-01-11 DIAGNOSIS — Z7902 Long term (current) use of antithrombotics/antiplatelets: Secondary | ICD-10-CM | POA: Insufficient documentation

## 2021-01-11 DIAGNOSIS — Z992 Dependence on renal dialysis: Secondary | ICD-10-CM | POA: Insufficient documentation

## 2021-01-11 DIAGNOSIS — R112 Nausea with vomiting, unspecified: Secondary | ICD-10-CM | POA: Diagnosis not present

## 2021-01-11 LAB — CBG MONITORING, ED: Glucose-Capillary: 231 mg/dL — ABNORMAL HIGH (ref 70–99)

## 2021-01-11 LAB — COMPREHENSIVE METABOLIC PANEL
ALT: 8 U/L (ref 0–44)
AST: 16 U/L (ref 15–41)
Albumin: 3.5 g/dL (ref 3.5–5.0)
Alkaline Phosphatase: 53 U/L (ref 38–126)
Anion gap: 10 (ref 5–15)
BUN: 6 mg/dL (ref 6–20)
CO2: 30 mmol/L (ref 22–32)
Calcium: 9.5 mg/dL (ref 8.9–10.3)
Chloride: 93 mmol/L — ABNORMAL LOW (ref 98–111)
Creatinine, Ser: 2.02 mg/dL — ABNORMAL HIGH (ref 0.44–1.00)
GFR, Estimated: 28 mL/min — ABNORMAL LOW (ref >60)
Glucose, Bld: 169 mg/dL — ABNORMAL HIGH (ref 70–99)
Potassium: 3.8 mmol/L (ref 3.5–5.1)
Sodium: 133 mmol/L — ABNORMAL LOW (ref 135–145)
Total Bilirubin: 0.7 mg/dL (ref 0.3–1.2)
Total Protein: 7.6 g/dL (ref 6.5–8.1)

## 2021-01-11 LAB — CBC WITH DIFFERENTIAL/PLATELET
Abs Immature Granulocytes: 0.02 10*3/uL (ref 0.00–0.07)
Basophils Absolute: 0 10*3/uL (ref 0.0–0.1)
Basophils Relative: 0 %
Eosinophils Absolute: 0 10*3/uL (ref 0.0–0.5)
Eosinophils Relative: 1 %
HCT: 32.4 % — ABNORMAL LOW (ref 36.0–46.0)
Hemoglobin: 10.1 g/dL — ABNORMAL LOW (ref 12.0–15.0)
Immature Granulocytes: 0 %
Lymphocytes Relative: 20 %
Lymphs Abs: 1 10*3/uL (ref 0.7–4.0)
MCH: 27.4 pg (ref 26.0–34.0)
MCHC: 31.2 g/dL (ref 30.0–36.0)
MCV: 87.8 fL (ref 80.0–100.0)
Monocytes Absolute: 0.3 10*3/uL (ref 0.1–1.0)
Monocytes Relative: 5 %
Neutro Abs: 3.8 10*3/uL (ref 1.7–7.7)
Neutrophils Relative %: 74 %
Platelets: 198 10*3/uL (ref 150–400)
RBC: 3.69 MIL/uL — ABNORMAL LOW (ref 3.87–5.11)
RDW: 14.5 % (ref 11.5–15.5)
WBC: 5.1 10*3/uL (ref 4.0–10.5)
nRBC: 0 % (ref 0.0–0.2)

## 2021-01-11 LAB — LACTIC ACID, PLASMA
Lactic Acid, Venous: 0.8 mmol/L (ref 0.5–1.9)
Lactic Acid, Venous: 0.8 mmol/L (ref 0.5–1.9)

## 2021-01-11 LAB — TROPONIN I (HIGH SENSITIVITY)
Troponin I (High Sensitivity): 14 ng/L (ref <18)
Troponin I (High Sensitivity): 14 ng/L (ref <18)

## 2021-01-11 LAB — BASIC METABOLIC PANEL
Anion gap: 13 (ref 5–15)
BUN: 13 mg/dL (ref 6–20)
CO2: 28 mmol/L (ref 22–32)
Calcium: 9.6 mg/dL (ref 8.9–10.3)
Chloride: 91 mmol/L — ABNORMAL LOW (ref 98–111)
Creatinine, Ser: 2.67 mg/dL — ABNORMAL HIGH (ref 0.44–1.00)
GFR, Estimated: 20 mL/min — ABNORMAL LOW (ref >60)
Glucose, Bld: 231 mg/dL — ABNORMAL HIGH (ref 70–99)
Potassium: 4 mmol/L (ref 3.5–5.1)
Sodium: 132 mmol/L — ABNORMAL LOW (ref 135–145)

## 2021-01-11 LAB — CBC
HCT: 33.7 % — ABNORMAL LOW (ref 36.0–46.0)
Hemoglobin: 10.2 g/dL — ABNORMAL LOW (ref 12.0–15.0)
MCH: 27.1 pg (ref 26.0–34.0)
MCHC: 30.3 g/dL (ref 30.0–36.0)
MCV: 89.4 fL (ref 80.0–100.0)
Platelets: 178 10*3/uL (ref 150–400)
RBC: 3.77 MIL/uL — ABNORMAL LOW (ref 3.87–5.11)
RDW: 14.5 % (ref 11.5–15.5)
WBC: 5.4 10*3/uL (ref 4.0–10.5)
nRBC: 0 % (ref 0.0–0.2)

## 2021-01-11 LAB — MAGNESIUM: Magnesium: 2.1 mg/dL (ref 1.7–2.4)

## 2021-01-11 LAB — LIPASE, BLOOD: Lipase: 35 U/L (ref 11–51)

## 2021-01-11 MED ORDER — HYDROXYZINE HCL 10 MG PO TABS: 10.0000 mg | ORAL_TABLET | Freq: Three times a day (TID) | ORAL | 0 refills | Status: AC | PRN

## 2021-01-11 MED ORDER — ONDANSETRON 4 MG PO TBDP
4.0000 mg | ORAL_TABLET | Freq: Once | ORAL | Status: AC
  Administered 2021-01-11: 4 mg via ORAL
  Filled 2021-01-11: qty 1

## 2021-01-11 MED ORDER — OXYCODONE-ACETAMINOPHEN 5-325 MG PO TABS: 1.0000 | ORAL_TABLET | Freq: Four times a day (QID) | ORAL | 0 refills | Status: AC | PRN

## 2021-01-11 MED ORDER — LIDOCAINE VISCOUS HCL 2 % MT SOLN
15.0000 mL | Freq: Once | OROMUCOSAL | Status: AC
  Administered 2021-01-11: 15 mL via ORAL
  Filled 2021-01-11: qty 15

## 2021-01-11 MED ORDER — ALUM & MAG HYDROXIDE-SIMETH 200-200-20 MG/5ML PO SUSP
30.0000 mL | Freq: Once | ORAL | Status: AC
  Administered 2021-01-11: 30 mL via ORAL
  Filled 2021-01-11: qty 30

## 2021-01-11 NOTE — ED Notes (Signed)
Report given to Keith W, RN   

## 2021-01-11 NOTE — ED Triage Notes (Signed)
Pt here from home with c/o n/v after starting on methadone for nerve pain , cbg 92 , hx of stroke slurred speech and right weakness normal for her

## 2021-01-11 NOTE — ED Provider Notes (Signed)
Emergency Medicine Provider Triage Evaluation Note  Hannah [name] , a 55 y.o. female  was evaluated in triage.  Pt complains of generalized weakness nausea vomiting.  Patient arrives with her daughter Hannah [name] who provides the majority of the history today.  Patient has a history of CVA, on dialysis.  A few months ago patient had right arm dialysis catheter placed she has had chronic right arm pain since that time.  Her primary care provider started her on tramadol on Friday.  After first dose tramadol Friday patient had 1 episode of nonbloody/nonbilious emesis.  She felt well after that time.  Yesterday patient went to dialysis had a full dialysis session.  Afterwards patient received another dose of tramadol and had recurrent nausea and vomiting and has been feeling generally weak since then.  Patient's daughter is also concerned that for the past day the patient's speech has seemed somewhat more slurred than normal.  Patient does have a history of CVA with residual right-sided deficits.  Of note patient diagnosed with COVID-19 in early August 2020. Review of Systems  Positive: Nausea, vomiting, generalized weakness, slurred speech Negative: Fever, chills, neck pain, chest pain, shortness of breath, abdominal pain, fall/injury  Physical Exam  BP (!) 164/68   Pulse (!) 104   Resp 18   SpO2 98%  Gen:   Awake, no distress   Resp:  Normal effort  MSK:   Right-sided weakness which daughter reports as baseline. Other:  Slight right facial droop daughter feels is new  Medical Decision Making  Medically screening exam initiated at 9:27 AM.  Appropriate orders placed.  Hannah [name] was informed that the remainder of the evaluation will be completed by another provider, this initial triage assessment does not replace that evaluation, and the importance of remaining in the ED until their evaluation is complete.  Basic labs obtained over 8 hours ago without leukocytosis or emergent electrolyte  derangement.  Anemia of 10.2 slightly worse than baseline.  Given dialysis patient with long wait will repeat basic labs.  I have also added on labs to assess for nausea vomiting and generalized weakness including magnesium, troponin, lipase, urinalysis, CBG.  Additionally will obtain chest x-ray and EKG for generalized weakness.  Patient's daughter is concerned for possible new right-sided facial droop patient does have a history of CVA.  I discussed obtaining CT head with patient and her daughter they elected to proceed with CT noncontrast.  Patient will need full evaluation in main ED.  Note: Portions of this report may have been transcribed using voice recognition software. Every effort was made to ensure accuracy; however, inadvertent computerized transcription errors may still be present.    Deliah Boston, PA-C 01/11/21 0932    Lorelle Gibbs, DO 01/12/21 1549

## 2021-01-11 NOTE — Discharge Instructions (Addendum)
You have been seen and discharged from the emergency department.  Follow-up with your primary provider for reevaluation and further care. Take home medications as prescribed. If you have any worsening symptoms or further concerns for your health please return to an emergency department for further evaluation. 

## 2021-01-11 NOTE — ED Notes (Signed)
  Pt transported to ct 

## 2021-01-11 NOTE — ED Provider Notes (Signed)
[facility] EMERGENCY DEPARTMENT Provider Note   CSN: [id number] Arrival date & time: 01/11/21  [id number]     History No chief complaint on file.   Hannah [name] is a 55 y.o. female.  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  Patient has been having chronic right upper extremity pain secondary to an AV fistula.  She 2 days ago she was placed on methadone from her primary doctor for further pain control.  After her first dose of methadone patient experienced nausea/vomiting.  And after her second dose today she again had nausea/vomiting.  Denies any abdominal pain, fever, diarrhea.  She has been compliant with dialysis otherwise.  Past Medical History:  Diagnosis Date   Cerebral infarction ([place]) 07/12/2017   2009 first one, has had a total of 4 stokes   Chronic kidney disease    tues thurs sat - dialysis   Diabetes type 2, controlled ([place])    Hemiparesis affecting right side as late effect of cerebrovascular accident (CVA) ([place])    Hyperlipidemia    Hypertension    Multiple sclerosis ([place]) 2009   Stroke [facility])    Walker as ambulation aid     Patient Active Problem List   Diagnosis Date Noted   Other chronic postprocedural pain 01/09/2021   Past heart attack 11/13/2020   Spastic hemiplegia due to cerebrovascular disease ([place]) 11/03/2020   Debility 10/18/2020   ESRD on hemodialysis (H[facility])    SOB (shortness of breath) 09/27/2020   Volume overload 09/26/2020   CKD (chronic kidney disease) stage 5, GFR less than 15 ml/min ([place]) 09/13/2020   H/O: CVA (cerebrovascular accident) 09/12/2020   Acute blood loss anemia    Chronic kidney disease (CKD), stage IV (severe) ([place])    Uncontrolled type 2 diabetes mellitus with hyperglycemia ([place])    Essential hypertension    Right pontine cerebrovascular  accident ([place]) 06/12/2020   Stroke-like symptoms 06/09/2020   Acute kidney injury superimposed on CKD ([place]) 06/09/2020   Cerebral thrombosis with cerebral infarction 06/09/2020   Hypertension    Hyperlipidemia    Diabetes type 2, controlled ([place])    Bimalleolar ankle fracture 11/24/2018   Lower abdominal pain 07/28/2017   Bladder mass 07/18/2017   Painless hematuria 07/18/2017   Intractable vomiting with nausea 07/12/2017   Hemiparesis affecting right side as late effect of cerebrovascular accident (CVA) ([place]) 09/22/2016   AKI (acute kidney injury) ([place]) 01/08/2016   CAD (coronary artery disease) 01/07/2016   Multiple sclerosis ([place]) 2009    Past Surgical History:  Procedure Laterality Date   APPENDECTOMY     AV FISTULA PLACEMENT Right 10/02/2020   Procedure: RIGHT ARTERIOVENOUS (AV) FISTULA CREATION;  Surgeon: Waynetta Sandy, MD;  Location: [facility];  Service: Vascular;  Laterality: Right;   [place] Right 11/24/2020   Procedure: SECOND STAGE RIGHT ARM [facility];  Surgeon: Marty Heck, MD;  Location: [facility];  Service: Vascular;  Laterality: Right;   CYSTOSTOMY W/ BLADDER BIOPSY     IR FLUORO GUIDE CV LINE RIGHT  09/29/2020   IR US GUIDE VASC ACCESS RIGHT  09/29/2020   OVARIAN CYST SURGERY     stent placed in heart       OB History   No obstetric history on file.     No family history on file.  Social History  Tobacco Use   Smoking status: Never   Smokeless tobacco: Never  Vaping Use   Vaping Use: Never used  Substance Use Topics   Alcohol use: Not Currently   Drug use: Never    Home Medications Prior to Admission medications   Medication Sig Start Date End Date Taking? Authorizing Provider  amLODipine (NORVASC) 10 MG tablet Take 1 tablet (10 mg total) by mouth daily. 11/03/20   Love, Ivan Anchors, PA-C  calcium acetate (PHOSLO) 667 MG capsule Take 667 mg by mouth See admin instructions. Take 667 mg by mouth with each  meal and snacks 11/11/20   [provider]  carvedilol (COREG) 25 MG tablet Take 1 tablet (25 mg total) by mouth 2 (two) times daily with a meal. 11/03/20   Love, Ivan Anchors, PA-C  clopidogrel (PLAVIX) 75 MG tablet Take 1 tablet (75 mg total) by mouth daily. 11/03/20   Love, Ivan Anchors, PA-C  Continuous Blood Gluc Sensor (FREESTYLE LIBRE 2 SENSOR) MISC USE AS DIRECTED CHANGE EVERY 14 DAYS 12/30/20   [provider]  dantrolene (DANTRIUM) 100 MG capsule Take 1 capsule (100 mg total) by mouth 2 (two) times daily. 01/09/21   Lovorn, Jinny Blossom, MD  diclofenac Sodium (VOLTAREN) 1 % GEL Apply 2 g topically 4 (four) times daily. To leg Patient taking differently: Apply 2 g topically 4 (four) times daily as needed (pain). To leg 11/03/20   Love, Ivan Anchors, PA-C  famotidine (PEPCID) 20 MG tablet Take 1 tablet (20 mg total) by mouth daily. 11/03/20   Love, Ivan Anchors, PA-C  famotidine (PEPCID) 40 MG tablet  07/19/20   [provider]  gabapentin (NEURONTIN) 300 MG capsule Take 1 capsule (300 mg total) by mouth at bedtime. 12/16/20   Marty Heck, MD  hydrALAZINE (APRESOLINE) 50 MG tablet Take 1 tablet (50 mg total) by mouth every 8 (eight) hours. 11/03/20   Love, Ivan Anchors, PA-C  HYDROcodone-acetaminophen (NORCO/VICODIN) 5-325 MG tablet Take 1 tablet by mouth every 4 (four) hours as needed for moderate pain. Patient not taking: Reported on 01/09/2021 11/24/20 11/24/21  Marty Heck, MD  hydrOXYzine (ATARAX/VISTARIL) 10 MG tablet Take 1 tablet (10 mg total) by mouth 3 (three) times daily as needed for itching. 12/16/20   Marty Heck, MD  insulin degludec (TRESIBA FLEXTOUCH) 100 UNIT/ML FlexTouch Pen Inject 12 Units into the skin at bedtime. 11/03/20   Love, Ivan Anchors, PA-C  insulin lispro (HUMALOG) 100 UNIT/ML KwikPen Inject 5 Units into the skin 3 (three) times daily. 11/04/20   Love, Ivan Anchors, PA-C  losartan (COZAAR) 50 MG tablet Take 50 mg by mouth daily. Patient not taking: Reported on  01/09/2021 12/29/20   [provider]  melatonin 5 MG TABS Take 5 mg by mouth at bedtime.    [provider]  methadone (DOLOPHINE) 5 MG tablet Take 1 tablet (5 mg total) by mouth every 12 (twelve) hours. 01/09/21   Lovorn, Jinny Blossom, MD  nitroGLYCERIN (NITROSTAT) 0.4 MG SL tablet Place 1 tablet (0.4 mg total) under the tongue every 5 (five) minutes as needed for chest pain. 06/25/20   Angiulli, Lavon Paganini, PA-C  oxyCODONE-acetaminophen (PERCOCET/ROXICET) 5-325 MG tablet Take 1 tablet by mouth every 4 (four) hours as needed for severe pain (Patient taking differently, states that she is taking only 1/2 tablet). Patient not taking: Reported on 01/09/2021 12/16/20   Marty Heck, MD  polyethylene glycol (MIRALAX / GLYCOLAX) 17 g packet Take 17 g by mouth daily. Patient taking  differently: Take 17 g by mouth daily as needed for moderate constipation. 11/04/20   Love, Ivan Anchors, PA-C  senna-docusate (SENOKOT-S) 8.6-50 MG tablet Take 1 tablet by mouth 2 (two) times daily as needed for moderate constipation. 11/03/20   Love, Ivan Anchors, PA-C  tiZANidine (ZANAFLEX) 2 MG tablet Take 1 tablet (2 mg total) by mouth at bedtime. 11/03/20   Love, Ivan Anchors, PA-C  traMADol (ULTRAM) 50 MG tablet SMARTSIG:0.5 Tablet(s) By Mouth Every Evening 01/05/21   [provider]    Allergies    Ibuprofen, Influenza vaccines, Pneumococcal vaccine, Promethazine, Ambien [zolpidem], Cyclobenzaprine, Tape, Tramadol hcl, and Oxycodone  Review of Systems   Review of Systems  Constitutional:  Negative for chills and fever.  HENT:  Negative for congestion.   Eyes:  Negative for visual disturbance.  Respiratory:  Negative for shortness of breath.   Cardiovascular:  Negative for chest pain.  Gastrointestinal:  Positive for nausea and vomiting. Negative for abdominal pain and diarrhea.  Genitourinary:  Negative for dysuria.  Skin:  Negative for rash.  Neurological:  Negative for headaches.   Physical Exam Updated  Vital Signs BP (!) 177/78   Pulse (!) 101   Temp 98.2 F (36.8 C) (Oral)   Resp 15   SpO2 99%   Physical Exam Vitals and nursing note reviewed.  Constitutional:      Appearance: Normal appearance.  HENT:     Head: Normocephalic.     Mouth/Throat:     Mouth: Mucous membranes are moist.  Cardiovascular:     Rate and Rhythm: Normal rate.  Pulmonary:     Effort: Pulmonary effort is normal. No respiratory distress.  Abdominal:     General: There is no distension.     Palpations: Abdomen is soft.     Tenderness: There is no abdominal tenderness.  Skin:    General: Skin is warm.  Neurological:     Mental Status: She is alert and oriented to person, place, and time. Mental status is at baseline.  Psychiatric:        Mood and Affect: Mood normal.    ED Results / Procedures / Treatments   Labs (all labs ordered are listed, but only abnormal results are displayed) Labs Reviewed  CBC - Abnormal; Notable for the following components:      Result Value   RBC 3.77 (*)    Hemoglobin 10.2 (*)    HCT 33.7 (*)    All other components within normal limits  COMPREHENSIVE METABOLIC PANEL - Abnormal; Notable for the following components:   Sodium 133 (*)    Chloride 93 (*)    Glucose, Bld 169 (*)    Creatinine, Ser 2.02 (*)    GFR, Estimated 28 (*)    All other components within normal limits  CBC WITH DIFFERENTIAL/PLATELET - Abnormal; Notable for the following components:   RBC 3.69 (*)    Hemoglobin 10.1 (*)    HCT 32.4 (*)    All other components within normal limits  BASIC METABOLIC PANEL - Abnormal; Notable for the following components:   Sodium 132 (*)    Chloride 91 (*)    Glucose, Bld 231 (*)    Creatinine, Ser 2.67 (*)    GFR, Estimated 20 (*)    All other components within normal limits  CBG MONITORING, ED - Abnormal; Notable for the following components:   Glucose-Capillary 231 (*)    All other components within normal limits  LACTIC ACID, PLASMA  MAGNESIUM  LIPASE, BLOOD  LACTIC ACID, PLASMA  URINALYSIS, ROUTINE W REFLEX MICROSCOPIC  TROPONIN I (HIGH SENSITIVITY)  TROPONIN I (HIGH SENSITIVITY)    EKG None  Radiology DG Chest 1 View  Result Date: 01/11/2021 CLINICAL DATA:  Weakness. EXAM: CHEST  1 VIEW COMPARISON:  December 18, 2020. FINDINGS: The heart size and mediastinal contours are within normal limits. Both lungs are clear. Right internal jugular dialysis catheter is unchanged. The visualized skeletal structures are unremarkable. IMPRESSION: No active disease. Electronically Signed   By: Marijo Conception M.D.   On: 01/11/2021 10:58   CT HEAD WO CONTRAST (5MM)  Result Date: 01/11/2021 CLINICAL DATA:  Dizziness, non-specific EXAM: CT HEAD WITHOUT CONTRAST TECHNIQUE: Contiguous axial images were obtained from the base of the skull through the vertex without intravenous contrast. COMPARISON:  Oct 03, 2020, June 09, 2020 FINDINGS: Brain: No evidence of acute infarction, hemorrhage, hydrocephalus, extra-axial collection or mass lesion/mass effect. Again noted are multiple areas of hypodensity predominantly involving the LEFT greater than RIGHT periventricular and subcortical white matter. This likely reflects the sequela of chronic microvascular ischemic disease superimposed on reported history of multiple sclerosis. There is a focal area of cortical hypodensity in the LEFT parietal lobe which is unchanged since June 09, 2020 MRI and likely reflects sequela of prior infarction (series 2, image 23). Remote bilateral basal ganglia lacunar infarction. Hypodensity of the pons, unchanged. Vascular: Mild vascular calcifications of the carotid siphons. Skull: Normal. Negative for fracture or focal lesion. Sinuses/Orbits: No acute finding. Other: None. IMPRESSION: No acute intracranial abnormality. If concern for acute stroke or active demyelinating lesion, recommend dedicated brain MRI with and without contrast. Electronically Signed   By: Valentino Saxon M.D.   On: 01/11/2021 10:19    Procedures Procedures   Medications Ordered in ED Medications  ondansetron (ZOFRAN-ODT) disintegrating tablet 4 mg (4 mg Oral Given 01/11/21 0112)  alum & mag hydroxide-simeth (MAALOX/MYLANTA) 200-200-20 MG/5ML suspension 30 mL (30 mLs Oral Given 01/11/21 1308)    And  lidocaine (XYLOCAINE) 2 % viscous mouth solution 15 mL (15 mLs Oral Given 01/11/21 1309)    ED Course  I have reviewed the triage vital signs and the nursing notes.  Pertinent labs & imaging results that were available during my care of the patient were reviewed by me and considered in my medical decision making (see chart for details).    MDM Rules/Calculators/A&P                           55 year old female presents the emergency department after episodes of vomiting related to methadone dose which is a new medicine for her.  Since being here patient has had no active vomiting, feels improved with antinausea/GI cocktail.  Patient has been able to p.o. here in the department without difficulty.  Her work-up is otherwise baseline for her, she has been compliant with dialysis, no signs of acute fluid overload.  Patient will follow up with her primary doctor tomorrow, I have prolonged her Percocet and hydroxyzine prescription for the next couple days until she can get to her primary doctor since she is going to self discontinue the methadone due to her nausea/vomiting.  Daughter is at bedside.  Patient and daughter agree with discharge plan. Patient is stable for DC.  Final Clinical Impression(s) / ED Diagnoses Final diagnoses:  Weakness    Rx / DC Orders ED Discharge Orders     None  Lorelle Gibbs, DO 01/11/21 1541

## 2021-01-12 ENCOUNTER — Observation Stay (HOSPITAL_COMMUNITY): Payer: Medicare Other

## 2021-01-12 ENCOUNTER — Emergency Department (HOSPITAL_COMMUNITY): Payer: Medicare Other

## 2021-01-12 ENCOUNTER — Other Ambulatory Visit (HOSPITAL_BASED_OUTPATIENT_CLINIC_OR_DEPARTMENT_OTHER): Payer: Self-pay

## 2021-01-12 ENCOUNTER — Observation Stay (HOSPITAL_COMMUNITY)
Admission: EM | Admit: 2021-01-12 | Discharge: 2021-01-13 | Disposition: A | Discharge status: Home / Self Care | Payer: Medicare Other | Attending: Internal Medicine | Admitting: Internal Medicine

## 2021-01-12 DIAGNOSIS — Z8673 Personal history of transient ischemic attack (TIA), and cerebral infarction without residual deficits: Secondary | ICD-10-CM

## 2021-01-12 DIAGNOSIS — N186 End stage renal disease: Secondary | ICD-10-CM | POA: Diagnosis not present

## 2021-01-12 DIAGNOSIS — Z888 Allergy status to other drugs, medicaments and biological substances status: Secondary | ICD-10-CM | POA: Insufficient documentation

## 2021-01-12 DIAGNOSIS — Z7902 Long term (current) use of antithrombotics/antiplatelets: Secondary | ICD-10-CM | POA: Insufficient documentation

## 2021-01-12 DIAGNOSIS — Z887 Allergy status to serum and vaccine status: Secondary | ICD-10-CM | POA: Insufficient documentation

## 2021-01-12 DIAGNOSIS — I252 Old myocardial infarction: Secondary | ICD-10-CM | POA: Insufficient documentation

## 2021-01-12 DIAGNOSIS — E041 Nontoxic single thyroid nodule: Secondary | ICD-10-CM | POA: Diagnosis not present

## 2021-01-12 DIAGNOSIS — R2681 Unsteadiness on feet: Secondary | ICD-10-CM | POA: Insufficient documentation

## 2021-01-12 DIAGNOSIS — E1165 Type 2 diabetes mellitus with hyperglycemia: Secondary | ICD-10-CM | POA: Diagnosis present

## 2021-01-12 DIAGNOSIS — I2510 Atherosclerotic heart disease of native coronary artery without angina pectoris: Secondary | ICD-10-CM | POA: Insufficient documentation

## 2021-01-12 DIAGNOSIS — Z79899 Other long term (current) drug therapy: Secondary | ICD-10-CM | POA: Insufficient documentation

## 2021-01-12 DIAGNOSIS — G35 Multiple sclerosis: Secondary | ICD-10-CM | POA: Insufficient documentation

## 2021-01-12 DIAGNOSIS — R0789 Other chest pain: Secondary | ICD-10-CM | POA: Diagnosis present

## 2021-01-12 DIAGNOSIS — E1169 Type 2 diabetes mellitus with other specified complication: Secondary | ICD-10-CM | POA: Diagnosis present

## 2021-01-12 DIAGNOSIS — Y900 Blood alcohol level of less than 20 mg/100 ml: Secondary | ICD-10-CM | POA: Diagnosis not present

## 2021-01-12 DIAGNOSIS — Z886 Allergy status to analgesic agent status: Secondary | ICD-10-CM | POA: Diagnosis not present

## 2021-01-12 DIAGNOSIS — R4182 Altered mental status, unspecified: Principal | ICD-10-CM

## 2021-01-12 DIAGNOSIS — I120 Hypertensive chronic kidney disease with stage 5 chronic kidney disease or end stage renal disease: Secondary | ICD-10-CM | POA: Diagnosis not present

## 2021-01-12 DIAGNOSIS — Z885 Allergy status to narcotic agent status: Secondary | ICD-10-CM | POA: Insufficient documentation

## 2021-01-12 DIAGNOSIS — E119 Type 2 diabetes mellitus without complications: Secondary | ICD-10-CM | POA: Diagnosis present

## 2021-01-12 DIAGNOSIS — U071 COVID-19: Secondary | ICD-10-CM | POA: Diagnosis not present

## 2021-01-12 DIAGNOSIS — I6521 Occlusion and stenosis of right carotid artery: Secondary | ICD-10-CM | POA: Diagnosis not present

## 2021-01-12 DIAGNOSIS — Z955 Presence of coronary angioplasty implant and graft: Secondary | ICD-10-CM | POA: Diagnosis not present

## 2021-01-12 DIAGNOSIS — I69951 Hemiplegia and hemiparesis following unspecified cerebrovascular disease affecting right dominant side: Secondary | ICD-10-CM | POA: Insufficient documentation

## 2021-01-12 DIAGNOSIS — Z992 Dependence on renal dialysis: Secondary | ICD-10-CM | POA: Insufficient documentation

## 2021-01-12 DIAGNOSIS — I10 Essential (primary) hypertension: Secondary | ICD-10-CM | POA: Diagnosis present

## 2021-01-12 DIAGNOSIS — M6281 Muscle weakness (generalized): Secondary | ICD-10-CM | POA: Diagnosis not present

## 2021-01-12 DIAGNOSIS — Z87891 Personal history of nicotine dependence: Secondary | ICD-10-CM | POA: Diagnosis not present

## 2021-01-12 DIAGNOSIS — E1122 Type 2 diabetes mellitus with diabetic chronic kidney disease: Secondary | ICD-10-CM | POA: Insufficient documentation

## 2021-01-12 DIAGNOSIS — I69928 Other speech and language deficits following unspecified cerebrovascular disease: Secondary | ICD-10-CM | POA: Insufficient documentation

## 2021-01-12 DIAGNOSIS — Z794 Long term (current) use of insulin: Secondary | ICD-10-CM | POA: Diagnosis not present

## 2021-01-12 DIAGNOSIS — Z7982 Long term (current) use of aspirin: Secondary | ICD-10-CM | POA: Diagnosis not present

## 2021-01-12 DIAGNOSIS — D631 Anemia in chronic kidney disease: Secondary | ICD-10-CM | POA: Insufficient documentation

## 2021-01-12 DIAGNOSIS — E785 Hyperlipidemia, unspecified: Secondary | ICD-10-CM | POA: Insufficient documentation

## 2021-01-12 LAB — URINALYSIS, ROUTINE W REFLEX MICROSCOPIC
Bacteria, UA: NONE SEEN
Bilirubin Urine: NEGATIVE
Glucose, UA: 50 mg/dL — AB
Hgb urine dipstick: NEGATIVE
Ketones, ur: 5 mg/dL — AB
Leukocytes,Ua: NEGATIVE
Nitrite: NEGATIVE
Protein, ur: >=300 mg/dL — AB
Specific Gravity, Urine: 1.038 — ABNORMAL HIGH (ref 1.005–1.030)
pH: 9 — ABNORMAL HIGH (ref 5.0–8.0)

## 2021-01-12 LAB — COMPREHENSIVE METABOLIC PANEL
ALT: 7 U/L (ref 0–44)
AST: 17 U/L (ref 15–41)
Albumin: 3.5 g/dL (ref 3.5–5.0)
Alkaline Phosphatase: 45 U/L (ref 38–126)
Anion gap: 8 (ref 5–15)
BUN: <5 mg/dL — ABNORMAL LOW (ref 6–20)
CO2: 31 mmol/L (ref 22–32)
Calcium: 9.2 mg/dL (ref 8.9–10.3)
Chloride: 102 mmol/L (ref 98–111)
Creatinine, Ser: 1.28 mg/dL — ABNORMAL HIGH (ref 0.44–1.00)
GFR, Estimated: 49 mL/min — ABNORMAL LOW (ref >60)
Glucose, Bld: 183 mg/dL — ABNORMAL HIGH (ref 70–99)
Potassium: 3.5 mmol/L (ref 3.5–5.1)
Sodium: 141 mmol/L (ref 135–145)
Total Bilirubin: 0.6 mg/dL (ref 0.3–1.2)
Total Protein: 7.4 g/dL (ref 6.5–8.1)

## 2021-01-12 LAB — DIFFERENTIAL
Abs Immature Granulocytes: 0.01 10*3/uL (ref 0.00–0.07)
Basophils Absolute: 0 10*3/uL (ref 0.0–0.1)
Basophils Relative: 1 %
Eosinophils Absolute: 0.1 10*3/uL (ref 0.0–0.5)
Eosinophils Relative: 3 %
Immature Granulocytes: 0 %
Lymphocytes Relative: 28 %
Lymphs Abs: 1.3 10*3/uL (ref 0.7–4.0)
Monocytes Absolute: 0.3 10*3/uL (ref 0.1–1.0)
Monocytes Relative: 7 %
Neutro Abs: 2.9 10*3/uL (ref 1.7–7.7)
Neutrophils Relative %: 61 %

## 2021-01-12 LAB — CBC
HCT: 30.1 % — ABNORMAL LOW (ref 36.0–46.0)
Hemoglobin: 9.7 g/dL — ABNORMAL LOW (ref 12.0–15.0)
MCH: 27.5 pg (ref 26.0–34.0)
MCHC: 32.2 g/dL (ref 30.0–36.0)
MCV: 85.3 fL (ref 80.0–100.0)
Platelets: 177 10*3/uL (ref 150–400)
RBC: 3.53 MIL/uL — ABNORMAL LOW (ref 3.87–5.11)
RDW: 14.6 % (ref 11.5–15.5)
WBC: 4.7 10*3/uL (ref 4.0–10.5)
nRBC: 0 % (ref 0.0–0.2)

## 2021-01-12 LAB — RESP PANEL BY RT-PCR (FLU A&B, COVID) ARPGX2
Influenza A by PCR: NEGATIVE
Influenza B by PCR: NEGATIVE
SARS Coronavirus 2 by RT PCR: POSITIVE — AB

## 2021-01-12 LAB — I-STAT CHEM 8, ED
BUN: <3 mg/dL — ABNORMAL LOW (ref 6–20)
Calcium, Ion: 1.11 mmol/L — ABNORMAL LOW (ref 1.15–1.40)
Chloride: 96 mmol/L — ABNORMAL LOW (ref 98–111)
Creatinine, Ser: 1.1 mg/dL — ABNORMAL HIGH (ref 0.44–1.00)
Glucose, Bld: 189 mg/dL — ABNORMAL HIGH (ref 70–99)
HCT: 30 % — ABNORMAL LOW (ref 36.0–46.0)
Hemoglobin: 10.2 g/dL — ABNORMAL LOW (ref 12.0–15.0)
Potassium: 3.8 mmol/L (ref 3.5–5.1)
Sodium: 138 mmol/L (ref 135–145)
TCO2: 35 mmol/L — ABNORMAL HIGH (ref 22–32)

## 2021-01-12 LAB — I-STAT BETA HCG BLOOD, ED (MC, WL, AP ONLY): I-stat hCG, quantitative: <5 m[IU]/mL (ref <5)

## 2021-01-12 LAB — ETHANOL: Alcohol, Ethyl (B): <10 mg/dL (ref <10)

## 2021-01-12 LAB — RAPID URINE DRUG SCREEN, HOSP PERFORMED
Amphetamines: NOT DETECTED
Barbiturates: NOT DETECTED
Benzodiazepines: NOT DETECTED
Cocaine: NOT DETECTED
Opiates: NOT DETECTED
Tetrahydrocannabinol: NOT DETECTED

## 2021-01-12 LAB — TROPONIN I (HIGH SENSITIVITY)
Troponin I (High Sensitivity): 16 ng/L (ref <18)
Troponin I (High Sensitivity): 16 ng/L (ref <18)

## 2021-01-12 LAB — CBG MONITORING, ED: Glucose-Capillary: 194 mg/dL — ABNORMAL HIGH (ref 70–99)

## 2021-01-12 LAB — APTT: aPTT: 31 seconds (ref 24–36)

## 2021-01-12 LAB — PROTIME-INR
INR: 1 (ref 0.8–1.2)
Prothrombin Time: 13.2 seconds (ref 11.4–15.2)

## 2021-01-12 MED ORDER — ACETAMINOPHEN 160 MG/5ML PO SOLN: 650.0000 mg | ORAL | Status: DC | PRN

## 2021-01-12 MED ORDER — NITROGLYCERIN 0.4 MG SL SUBL: 0.4000 mg | SUBLINGUAL_TABLET | SUBLINGUAL | Status: DC | PRN

## 2021-01-12 MED ORDER — ACETAMINOPHEN 650 MG RE SUPP: 650.0000 mg | RECTAL | Status: DC | PRN

## 2021-01-12 MED ORDER — INSULIN ASPART 100 UNIT/ML IJ SOLN
0.0000 [IU] | Freq: Three times a day (TID) | INTRAMUSCULAR | Status: DC
  Administered 2021-01-13: 1 [IU] via SUBCUTANEOUS
  Filled 2021-01-12: qty 0.06

## 2021-01-12 MED ORDER — IOHEXOL 350 MG/ML SOLN
100.0000 mL | Freq: Once | INTRAVENOUS | Status: AC | PRN
  Administered 2021-01-12: 100 mL via INTRAVENOUS

## 2021-01-12 MED ORDER — HEPARIN SODIUM (PORCINE) 5000 UNIT/ML IJ SOLN
5000.0000 [IU] | Freq: Three times a day (TID) | INTRAMUSCULAR | Status: DC
  Administered 2021-01-12 – 2021-01-13 (×3): 5000 [IU] via SUBCUTANEOUS
  Filled 2021-01-12 (×4): qty 1

## 2021-01-12 MED ORDER — CLOPIDOGREL BISULFATE 75 MG PO TABS
75.0000 mg | ORAL_TABLET | Freq: Every day | ORAL | Status: DC
  Administered 2021-01-13: 75 mg via ORAL
  Filled 2021-01-12: qty 1

## 2021-01-12 MED ORDER — CALCIUM ACETATE (PHOS BINDER) 667 MG PO CAPS
667.0000 mg | ORAL_CAPSULE | Freq: Three times a day (TID) | ORAL | Status: DC
  Administered 2021-01-13: 667 mg via ORAL
  Filled 2021-01-12 (×4): qty 1

## 2021-01-12 MED ORDER — INSULIN GLARGINE-YFGN 100 UNIT/ML ~~LOC~~ SOLN
5.0000 [IU] | Freq: Every day | SUBCUTANEOUS | Status: DC
  Administered 2021-01-13: 5 [IU] via SUBCUTANEOUS
  Filled 2021-01-12 (×2): qty 0.05

## 2021-01-12 MED ORDER — SENNOSIDES-DOCUSATE SODIUM 8.6-50 MG PO TABS: 1.0000 | ORAL_TABLET | Freq: Every evening | ORAL | Status: DC | PRN

## 2021-01-12 MED ORDER — ATORVASTATIN CALCIUM 80 MG PO TABS
80.0000 mg | ORAL_TABLET | Freq: Every day | ORAL | Status: DC
  Filled 2021-01-12: qty 2

## 2021-01-12 MED ORDER — ACETAMINOPHEN 325 MG PO TABS
650.0000 mg | ORAL_TABLET | ORAL | Status: DC | PRN
  Administered 2021-01-13: 650 mg via ORAL
  Filled 2021-01-12: qty 2

## 2021-01-12 MED ORDER — STROKE: EARLY STAGES OF RECOVERY BOOK
Freq: Once | Status: AC
  Filled 2021-01-12: qty 1

## 2021-01-12 MED ORDER — ASPIRIN 325 MG PO TABS
325.0000 mg | ORAL_TABLET | Freq: Every day | ORAL | Status: DC
  Administered 2021-01-12 – 2021-01-13 (×2): 325 mg via ORAL
  Filled 2021-01-12 (×2): qty 1

## 2021-01-12 MED ORDER — ASPIRIN 300 MG RE SUPP: 300.0000 mg | Freq: Every day | RECTAL | Status: DC

## 2021-01-12 MED ORDER — INSULIN ASPART 100 UNIT/ML IJ SOLN
0.0000 [IU] | Freq: Every day | INTRAMUSCULAR | Status: DC
  Filled 2021-01-12: qty 0.05

## 2021-01-12 NOTE — ED Notes (Signed)
Attempted to call report. [facility] nurse will call back.

## 2021-01-12 NOTE — Consult Note (Signed)
[facility] Telemedicine Consult   Requesting Provider: Joaquin Music Consult Participants: Patient, her daughter, atrium nurse Jorje Guild, patient's bedside nurse Location of the provider: Stroke center office [facility] Location of the patient: [facility] emergency room  This consult was provided via telemedicine with 2-way video and audio communication. The patient/family was informed that care would be provided in this way and agreed to receive care in this manner.    Chief Complaint: Code stroke  HPI: Ms. Hamilton is a 55 year old African-American lady with past medical history of end-stage renal disease on hemodialysis, hypertension, diabetes, hyperlipidemia, multiple strokes with residual right hemiparesis with some speech difficulties, chronic right upper extremity pain.  She presented today with sudden onset of altered mental status noted at 1:30 PM.  History is provided by her daughter.  Patient was found to be unresponsive and not answering questions very well.  She was found to have more dysarthria than her baseline as well as a trouble finding words and speaking.  She is brought as a code stroke for this.  On evaluation she had mild dysarthria and some nonfluent speech but appeared to have some old spastic right hemiparesis.  NIH stroke scale was felt to be 6 but a lot of those deficits were old.  Patient had a somewhat similar presentation in January of this year and at that time was found to have a small brainstem stroke.  I spoke at length with the patient and her daughter regarding possibility of a small lacunar stroke versus exacerbation of old deficits in the setting of possible dehydration as she was seen in the ER 2 days ago for nausea vomiting following starting new pain medications which she did not tolerate.  Patient after hearing risk-benefit discussion about tenecteplase decided not to take it ends it was deferred.  Stat CT scan of the head was obtained which  showed no acute abnormality aspect score was 10.  CT angiogram of the brain was and also emergently obtained which showed no large vessel stenosis or occlusion.  Patient was admitted to the medical hospitalist team for further evaluation after discussion with ER physician patient and daughter were all in agreement with the plan.    LKW: 1:30 PM on 01/12/2021 tpa given?: No, as patient refused and deficits were mild no LVO IR Thrombectomy? No, no LVO found Modified Rankin Scale: 3-Moderate disability-requires help but walks WITHOUT assistance Time of teleneurologist evaluation: 14 30  Exam: Vitals:   01/12/21 1630 01/12/21 1700  BP: (!) 195/86 (!) 183/80  Pulse: 91 91  Resp: 12 11  Temp:    SpO2: 98% 98%    General: Obese middle-age African-American lady not in distress.  . Afebrile. Head is nontraumatic. Neck is supple without bruit.    Cardiac exam no murmur or gallop. Lungs are clear to auscultation. Distal pulses are well felt.  1A: Level of Consciousness -0 1B: Ask Month and Age -40 1C: 'Blink Eyes' & 'Squeeze Hands' -0 2: Test Horizontal Extraocular Movements -0 3: Test Visual Fields - 1 but not 4: Test Facial Palsy -1 right lower facial droop 5A: Test Left Arm Motor Drift -1.cooperation 5B: Test Right Arm Motor Drift -1 6A: Test Left Leg Motor Drift -1 6B: Test Right Leg Motor Drift -1 7: Test Limb Ataxia -0 8: Test Sensation -0 9: Test Language/Aphasia-1 nonfluent hesitant speech 10: Test Dysarthria -1 11: Test Extinction/Inattention -0 NIHSS score: 7   Imaging Reviewed: CT scan of the head, CT angiogram of the brain  and neck  Labs reviewed in epic and pertinent values follow: CMP, CBC and coagulation labs   Assessment: 55 year old lady with multiple past medical history with baseline dysarthria and right hemiparesis from previous multiple strokes presents with altered mental status and worsening dysarthria and speech difficulties unclear as to whether this  represents new stroke versus exacerbation of old deficits. Patient refuses IV thrombolysis after discussion of risk-benefit but is willing to get admitted for further evaluation.  CT angiogram does not show an LVO Recommendations:  Admit to the medical hospitalist team.  Check MRI scan of the brain and if positive for stroke may consider further evaluation.  Check lipid profile, hemoglobin A1c.  Medical management for her renal failure as per primary team.35    This patient is receiving care for possible acute neurological changes. There was 60 minutes of care by this provider at the time of service, including time for direct evaluation via telemedicine, review of medical records, imaging studies and discussion of findings with providers, the patient and/or family.  Antony Contras MD [facility] [phone]  If 7pm- 7am, please page neurology on call as listed in [place].

## 2021-01-12 NOTE — ED Notes (Signed)
194 [facility] (MRN unavailable at time of test, edit sheet faxed to [place].)

## 2021-01-12 NOTE — H&P (Signed)
History and Physical    Hannah [name] [id number] DOB: 24-Aug-1965 DOA: 01/12/2021  PCP: Vernie Shanks, MD   Patient coming from: Home  I have personally briefly reviewed patient's old medical records in [place]  Chief Complaint: Altered mental status  HPI: Hannah [name] is a 55 y.o. female with medical history significant of end-stage renal disease on hemodialysis, hypertension, hyperlipidemia, diabetes mellitus type 2, unspecified multiple CVA (as per the daughter) with residual right-sided hemiparesis and some speech difficulties, reported history of multiple sclerosis, chronic right upper extremity pain (questionable secondary to AV fistula) recently placed on methadone with resulting vomiting and ED presentation on 01/11/2021.  She presented today with altered mental status.  History is provided mostly by the daughter present at bedside.  Patient is more awake at the time of my examination but still a poor historian.  Patient apparently had a normal session of dialysis today and felt fine afterwards.  As per the daughter, while driving to her next appointment, patient felt weak and had some transient chest discomfort and subsequently she was less responsive.  There was no loss of consciousness, headache, trauma to head or opiate use in the last 12 to 24 hours.  No reported fevers, worsening shortness of breath, cough, abdominal pain, diarrhea or dysuria reported.  Daughter reports that patient has right facial droop which is at baseline but and worsening right upper extremity weakness today.  ED Course: Code stroke was called.  CT of the head was negative for acute intracranial bleed.  EKG without any acute ischemic findings.  Neurology was consulted by ED provider who recommended TNKase but patient refused it.  CT angio of head showed no hemodynamically significant stenosis or occlusion. Hospitalist service was called to evaluate the patient.  Review of Systems: Could not be fully  reviewed because of patient being a poor historian and currently very slow to respond.  Past Medical History:  Diagnosis Date   Cerebral infarction ([place]) 07/12/2017   2009 first one, has had a total of 4 stokes   Chronic kidney disease    tues thurs sat - dialysis   Diabetes type 2, controlled ([place])    Hemiparesis affecting right side as late effect of cerebrovascular accident (CVA) ([place])    Hyperlipidemia    Hypertension    Multiple sclerosis ([place]) 2009   Stroke [facility])    Walker as ambulation aid     Past Surgical History:  Procedure Laterality Date   APPENDECTOMY     AV FISTULA PLACEMENT Right 10/02/2020   Procedure: RIGHT ARTERIOVENOUS (AV) FISTULA CREATION;  Surgeon: Waynetta Sandy, MD;  Location: [facility];  Service: Vascular;  Laterality: Right;   [place] Right 11/24/2020   Procedure: SECOND STAGE RIGHT ARM [place];  Surgeon: Marty Heck, MD;  Location: [facility];  Service: Vascular;  Laterality: Right;   CYSTOSTOMY W/ BLADDER BIOPSY     IR FLUORO GUIDE CV LINE RIGHT  09/29/2020   IR US GUIDE VASC ACCESS RIGHT  09/29/2020   OVARIAN CYST SURGERY     stent placed in heart       reports that she has never smoked. She has never used smokeless tobacco. She reports that she does not currently use alcohol. She reports that she does not use drugs.  Allergies  Allergen Reactions   Ibuprofen Swelling    Swelling to hands, feet and face    Influenza Vaccines Anaphylaxis and Swelling    fever  Pneumococcal Vaccine Anaphylaxis and Swelling    fever   Promethazine Other (See Comments)    hallucinations   Ambien [Zolpidem]     Other reaction(s): Unknown   Cyclobenzaprine Other (See Comments)    hallucinations    Tape Itching   Tramadol Hcl Other (See Comments)    hallucinations   Oxycodone Nausea And Vomiting    No family history on file.  Prior to Admission medications   Medication Sig Start Date End Date Taking?  Authorizing Provider  amLODipine (NORVASC) 10 MG tablet Take 1 tablet (10 mg total) by mouth daily. 11/03/20   Love, Ivan Anchors, PA-C  calcium acetate (PHOSLO) 667 MG capsule Take 667 mg by mouth See admin instructions. Take 667 mg by mouth with each meal and snacks 11/11/20   [provider]  carvedilol (COREG) 25 MG tablet Take 1 tablet (25 mg total) by mouth 2 (two) times daily with a meal. 11/03/20   Love, Ivan Anchors, PA-C  clopidogrel (PLAVIX) 75 MG tablet Take 1 tablet (75 mg total) by mouth daily. 11/03/20   Love, Ivan Anchors, PA-C  Continuous Blood Gluc Sensor (FREESTYLE LIBRE 2 SENSOR) MISC USE AS DIRECTED CHANGE EVERY 14 DAYS 12/30/20   [provider]  dantrolene (DANTRIUM) 100 MG capsule Take 1 capsule (100 mg total) by mouth 2 (two) times daily. 01/09/21   Lovorn, Jinny Blossom, MD  diclofenac Sodium (VOLTAREN) 1 % GEL Apply 2 g topically 4 (four) times daily. To leg Patient taking differently: Apply 2 g topically 4 (four) times daily as needed (pain). To leg 11/03/20   Love, Ivan Anchors, PA-C  famotidine (PEPCID) 20 MG tablet Take 1 tablet (20 mg total) by mouth daily. 11/03/20   Love, Ivan Anchors, PA-C  gabapentin (NEURONTIN) 300 MG capsule Take 1 capsule (300 mg total) by mouth at bedtime. 12/16/20   Marty Heck, MD  hydrALAZINE (APRESOLINE) 50 MG tablet Take 1 tablet (50 mg total) by mouth every 8 (eight) hours. 11/03/20   Love, Ivan Anchors, PA-C  hydrOXYzine (ATARAX/VISTARIL) 10 MG tablet Take 1 tablet (10 mg total) by mouth 3 (three) times daily as needed for up to 7 days for itching. 01/11/21 01/18/21  Horton, Alvin Critchley, DO  insulin degludec (TRESIBA FLEXTOUCH) 100 UNIT/ML FlexTouch Pen Inject 12 Units into the skin at bedtime. Patient taking differently: Inject 8-12 Units into the skin at bedtime. Sliding scale 11/03/20   Love, Ivan Anchors, PA-C  insulin lispro (HUMALOG) 100 UNIT/ML KwikPen Inject 5 Units into the skin 3 (three) times daily. 11/04/20   Love, Ivan Anchors, PA-C  melatonin 5 MG TABS  Take 5 mg by mouth at bedtime.    [provider]  methadone (DOLOPHINE) 5 MG tablet Take 1 tablet (5 mg total) by mouth every 12 (twelve) hours. 01/09/21   Lovorn, Jinny Blossom, MD  nitroGLYCERIN (NITROSTAT) 0.4 MG SL tablet Place 1 tablet (0.4 mg total) under the tongue every 5 (five) minutes as needed for chest pain. 06/25/20   Angiulli, Lavon Paganini, PA-C  oxyCODONE-acetaminophen (PERCOCET/ROXICET) 5-325 MG tablet Take 1 tablet by mouth every 6 (six) hours as needed for up to 3 days for severe pain (Patient taking differently, states that she is taking only 1/2 tablet). 01/11/21 01/14/21  Horton, Alvin Critchley, DO  polyethylene glycol (MIRALAX / GLYCOLAX) 17 g packet Take 17 g by mouth daily. Patient taking differently: Take 17 g by mouth daily as needed for moderate constipation. 11/04/20   Love, Ivan Anchors, PA-C  senna-docusate (SENOKOT-S) 8.6-50  MG tablet Take 1 tablet by mouth 2 (two) times daily as needed for moderate constipation. 11/03/20   Love, Ivan Anchors, PA-C  tiZANidine (ZANAFLEX) 2 MG tablet Take 1 tablet (2 mg total) by mouth at bedtime. 11/03/20   Bary Leriche, PA-C    Physical Exam: Vitals:   01/12/21 1445 01/12/21 1500 01/12/21 1515 01/12/21 1615  BP: (!) 175/75 (!) 173/71 (!) 176/81 (!) 186/78  Pulse: 95 90 93 92  Resp: '10 10 14 14  '$ Temp:      TempSrc:      SpO2: 99% 98% 99% 97%  Weight:      Height:        Constitutional: NAD, calm, comfortable.  Looks chronically ill and deconditioned.  Currently on room air. Vitals:   01/12/21 1445 01/12/21 1500 01/12/21 1515 01/12/21 1615  BP: (!) 175/75 (!) 173/71 (!) 176/81 (!) 186/78  Pulse: 95 90 93 92  Resp: '10 10 14 14  '$ Temp:      TempSrc:      SpO2: 99% 98% 99% 97%  Weight:      Height:       Eyes: PERRL, lids and conjunctivae normal ENMT: Mucous membranes are moist. Posterior pharynx clear of any exudate or lesions. Neck: normal, supple, no masses, no thyromegaly Respiratory: bilateral decreased breath sounds at bases, no  wheezing, no crackles. Normal respiratory effort. No accessory muscle use.  Cardiovascular: S1 S2 positive, rate controlled. No extremity edema. 2+ pedal pulses.  Abdomen: no tenderness, no masses palpated. No hepatosplenomegaly. Bowel sounds positive.  Musculoskeletal: no clubbing / cyanosis. No joint deformity upper and lower extremities.  Skin: no rashes, lesions, ulcers. No induration.   Neurologic: Awake, extremely slow to respond but answers questions appropriately and is oriented to time.  Right upper and lower extremity weakness present.  Speech is slightly slurred  psychiatric: Could not be assessed because of mental status.  Mostly flat affect.   Labs on Admission: I have personally reviewed following labs and imaging studies  CBC: Recent Labs  Lab 01/11/21 0101 01/11/21 0926 01/12/21 1405 01/12/21 1419  WBC 5.4 5.1 4.7  --   NEUTROABS  --  3.8 2.9  --   HGB 10.2* 10.1* 9.7* 10.2*  HCT 33.7* 32.4* 30.1* 30.0*  MCV 89.4 87.8 85.3  --   PLT 178 198 177  --    Basic Metabolic Panel: Recent Labs  Lab 01/11/21 0101 01/11/21 0926 01/12/21 1405 01/12/21 1419  NA 133* 132* 141 138  K 3.8 4.0 3.5 3.8  CL 93* 91* 102 96*  CO2 '30 28 31  '$ --   GLUCOSE 169* 231* 183* 189*  BUN 6 13 <5* <3*  CREATININE 2.02* 2.67* 1.28* 1.10*  CALCIUM 9.5 9.6 9.2  --   MG  --  2.1  --   --    GFR: Estimated Creatinine Clearance: 45.2 mL/min (A) (by C-G formula based on SCr of 1.1 mg/dL (H)). Liver Function Tests: Recent Labs  Lab 01/11/21 0101 01/12/21 1405  AST 16 17  ALT 8 7  ALKPHOS 53 45  BILITOT 0.7 0.6  PROT 7.6 7.4  ALBUMIN 3.5 3.5   Recent Labs  Lab 01/11/21 0926  LIPASE 35   No results for input(s): AMMONIA in the last 168 hours. Coagulation Profile: Recent Labs  Lab 01/12/21 1405  INR 1.0   Cardiac Enzymes: No results for input(s): CKTOTAL, CKMB, CKMBINDEX, TROPONINI in the last 168 hours. BNP (last 3 results) No results for input(s):  PROBNP in the last 8760  hours. HbA1C: No results for input(s): HGBA1C in the last 72 hours. CBG: Recent Labs  Lab 01/11/21 0932 01/12/21 1354  GLUCAP 231* 194*   Lipid Profile: No results for input(s): CHOL, HDL, LDLCALC, TRIG, CHOLHDL, LDLDIRECT in the last 72 hours. Thyroid Function Tests: No results for input(s): TSH, T4TOTAL, FREET4, T3FREE, THYROIDAB in the last 72 hours. Anemia Panel: No results for input(s): VITAMINB12, FOLATE, FERRITIN, TIBC, IRON, RETICCTPCT in the last 72 hours. Urine analysis:    Component Value Date/Time   COLORURINE STRAW (A) 09/12/2020 2208   APPEARANCEUR CLEAR 09/12/2020 2208   LABSPEC 1.007 09/12/2020 2208   PHURINE 6.0 09/12/2020 2208   GLUCOSEU >=500 (A) 09/12/2020 2208   HGBUR NEGATIVE 09/12/2020 2208   BILIRUBINUR NEGATIVE 09/12/2020 2208   KETONESUR NEGATIVE 09/12/2020 2208   PROTEINUR >=300 (A) 09/12/2020 2208   NITRITE NEGATIVE 09/12/2020 2208   LEUKOCYTESUR NEGATIVE 09/12/2020 2208    Radiological Exams on Admission: CT Angio Head W or Wo Contrast  Result Date: 01/12/2021 CLINICAL DATA:  Follow up code stroke, unable to move without assistance EXAM: CT ANGIOGRAPHY HEAD AND NECK CT PERFUSION BRAIN TECHNIQUE: Multidetector CT imaging of the head and neck was performed using the standard protocol during bolus administration of intravenous contrast. Multiplanar CT image reconstructions and MIPs were obtained to evaluate the vascular anatomy. Carotid stenosis measurements (when applicable) are obtained utilizing NASCET criteria, using the distal internal carotid diameter as the denominator. Multiphase CT imaging of the brain was performed following IV bolus contrast injection. Subsequent parametric perfusion maps were calculated using RAPID software. CONTRAST:  1102m OMNIPAQUE IOHEXOL 350 MG/ML SOLN COMPARISON:  Same-day noncontrast CT head FINDINGS: CTA NECK FINDINGS Aortic arch: Standard branching. Imaged portion shows no evidence of aneurysm or dissection. No  significant stenosis of the major arch vessel origins. Right carotid system: There is mild calcified atherosclerotic plaque at the right carotid bulb resulting in approximately 40-50% stenosis. The distal right internal carotid artery is widely patent. There is no evidence of dissection or aneurysm. Left carotid system: No evidence of dissection, stenosis (50% or greater) or occlusion. Vertebral arteries: The bilateral vertebral arteries are otherwise patent, with no evidence of dissection, hemodynamically significant stenosis, or occlusion. Skeleton: There is mild degenerative change of the cervical spine, most advanced at C4-C5. Other neck: The thyroid is diffusely enlarged and heterogeneous with a single nodule measuring up to 1.4 cm. A right-sided dialysis catheter is noted. The tip is not evaluated. There is a mucous retention cyst in the left sphenoid sinus. There are numerous dental caries and periapical lucencies around the roots of multiple teeth, most notably the posterior most left maxillary molar. Upper chest: The lung apices are clear. Review of the MIP images confirms the above findings CTA HEAD FINDINGS Anterior circulation: There is calcification of the bilateral cavernous ICAs without hemodynamically significant stenosis or occlusion. The bilateral MCAs and ACAs are patent. There is no aneurysm. Posterior circulation: The V4 segments of the vertebral arteries are patent. The basilar artery is patent. The bilateral PCAs are patent. There is no aneurysm. Venous sinuses: As permitted by contrast timing, patent. Anatomic variants: None. Review of the MIP images confirms the above findings CT Brain Perfusion Findings: ASPECTS: 10 CBF (<30%) Volume: 021mPerfusion (Tmax>6.0s) volume: 79m5mismatch Volume: n/a Infarction Location:n/a IMPRESSION: 1. No large vessel occlusion or dissection identified. 2. No infarct core or penumbra identified. 3. Approximately 50% stenosis of the proximal right internal  carotid artery and mild  calcification of the bilateral cavernous ICAs without hemodynamically significant stenosis. Otherwise, patent vasculature of the head and neck. 4. Enlarged and heterogeneous thyroid with a 1.4 cm nodule in the left. No specific imaging follow-up is required for this nodule. 5. Extensive dental disease as detailed above. Electronically Signed   By: Valetta Mole M.D.   On: 01/12/2021 16:13   DG Chest 1 View  Result Date: 01/11/2021 CLINICAL DATA:  Weakness. EXAM: CHEST  1 VIEW COMPARISON:  December 18, 2020. FINDINGS: The heart size and mediastinal contours are within normal limits. Both lungs are clear. Right internal jugular dialysis catheter is unchanged. The visualized skeletal structures are unremarkable. IMPRESSION: No active disease. Electronically Signed   By: Marijo Conception M.D.   On: 01/11/2021 10:58   CT HEAD WO CONTRAST (5MM)  Result Date: 01/11/2021 CLINICAL DATA:  Dizziness, non-specific EXAM: CT HEAD WITHOUT CONTRAST TECHNIQUE: Contiguous axial images were obtained from the base of the skull through the vertex without intravenous contrast. COMPARISON:  Oct 03, 2020, June 09, 2020 FINDINGS: Brain: No evidence of acute infarction, hemorrhage, hydrocephalus, extra-axial collection or mass lesion/mass effect. Again noted are multiple areas of hypodensity predominantly involving the LEFT greater than RIGHT periventricular and subcortical white matter. This likely reflects the sequela of chronic microvascular ischemic disease superimposed on reported history of multiple sclerosis. There is a focal area of cortical hypodensity in the LEFT parietal lobe which is unchanged since June 09, 2020 MRI and likely reflects sequela of prior infarction (series 2, image 23). Remote bilateral basal ganglia lacunar infarction. Hypodensity of the pons, unchanged. Vascular: Mild vascular calcifications of the carotid siphons. Skull: Normal. Negative for fracture or focal lesion.  Sinuses/Orbits: No acute finding. Other: None. IMPRESSION: No acute intracranial abnormality. If concern for acute stroke or active demyelinating lesion, recommend dedicated brain MRI with and without contrast. Electronically Signed   By: Valentino Saxon M.D.   On: 01/11/2021 10:19   CT Angio Neck W and/or Wo Contrast  Result Date: 01/12/2021 CLINICAL DATA:  Follow up code stroke, unable to move without assistance EXAM: CT ANGIOGRAPHY HEAD AND NECK CT PERFUSION BRAIN TECHNIQUE: Multidetector CT imaging of the head and neck was performed using the standard protocol during bolus administration of intravenous contrast. Multiplanar CT image reconstructions and MIPs were obtained to evaluate the vascular anatomy. Carotid stenosis measurements (when applicable) are obtained utilizing NASCET criteria, using the distal internal carotid diameter as the denominator. Multiphase CT imaging of the brain was performed following IV bolus contrast injection. Subsequent parametric perfusion maps were calculated using RAPID software. CONTRAST:  177m OMNIPAQUE IOHEXOL 350 MG/ML SOLN COMPARISON:  Same-day noncontrast CT head FINDINGS: CTA NECK FINDINGS Aortic arch: Standard branching. Imaged portion shows no evidence of aneurysm or dissection. No significant stenosis of the major arch vessel origins. Right carotid system: There is mild calcified atherosclerotic plaque at the right carotid bulb resulting in approximately 40-50% stenosis. The distal right internal carotid artery is widely patent. There is no evidence of dissection or aneurysm. Left carotid system: No evidence of dissection, stenosis (50% or greater) or occlusion. Vertebral arteries: The bilateral vertebral arteries are otherwise patent, with no evidence of dissection, hemodynamically significant stenosis, or occlusion. Skeleton: There is mild degenerative change of the cervical spine, most advanced at C4-C5. Other neck: The thyroid is diffusely enlarged and  heterogeneous with a single nodule measuring up to 1.4 cm. A right-sided dialysis catheter is noted. The tip is not evaluated. There is a mucous retention cyst in  the left sphenoid sinus. There are numerous dental caries and periapical lucencies around the roots of multiple teeth, most notably the posterior most left maxillary molar. Upper chest: The lung apices are clear. Review of the MIP images confirms the above findings CTA HEAD FINDINGS Anterior circulation: There is calcification of the bilateral cavernous ICAs without hemodynamically significant stenosis or occlusion. The bilateral MCAs and ACAs are patent. There is no aneurysm. Posterior circulation: The V4 segments of the vertebral arteries are patent. The basilar artery is patent. The bilateral PCAs are patent. There is no aneurysm. Venous sinuses: As permitted by contrast timing, patent. Anatomic variants: None. Review of the MIP images confirms the above findings CT Brain Perfusion Findings: ASPECTS: 10 CBF (<30%) Volume: 46m Perfusion (Tmax>6.0s) volume: 061mMismatch Volume: n/a Infarction Location:n/a IMPRESSION: 1. No large vessel occlusion or dissection identified. 2. No infarct core or penumbra identified. 3. Approximately 50% stenosis of the proximal right internal carotid artery and mild calcification of the bilateral cavernous ICAs without hemodynamically significant stenosis. Otherwise, patent vasculature of the head and neck. 4. Enlarged and heterogeneous thyroid with a 1.4 cm nodule in the left. No specific imaging follow-up is required for this nodule. 5. Extensive dental disease as detailed above. Electronically Signed   By: PeValetta Mole.D.   On: 01/12/2021 16:13   CT CEREBRAL PERFUSION W CONTRAST  Result Date: 01/12/2021 CLINICAL DATA:  Follow up code stroke, unable to move without assistance EXAM: CT ANGIOGRAPHY HEAD AND NECK CT PERFUSION BRAIN TECHNIQUE: Multidetector CT imaging of the head and neck was performed using the standard  protocol during bolus administration of intravenous contrast. Multiplanar CT image reconstructions and MIPs were obtained to evaluate the vascular anatomy. Carotid stenosis measurements (when applicable) are obtained utilizing NASCET criteria, using the distal internal carotid diameter as the denominator. Multiphase CT imaging of the brain was performed following IV bolus contrast injection. Subsequent parametric perfusion maps were calculated using RAPID software. CONTRAST:  10042mMNIPAQUE IOHEXOL 350 MG/ML SOLN COMPARISON:  Same-day noncontrast CT head FINDINGS: CTA NECK FINDINGS Aortic arch: Standard branching. Imaged portion shows no evidence of aneurysm or dissection. No significant stenosis of the major arch vessel origins. Right carotid system: There is mild calcified atherosclerotic plaque at the right carotid bulb resulting in approximately 40-50% stenosis. The distal right internal carotid artery is widely patent. There is no evidence of dissection or aneurysm. Left carotid system: No evidence of dissection, stenosis (50% or greater) or occlusion. Vertebral arteries: The bilateral vertebral arteries are otherwise patent, with no evidence of dissection, hemodynamically significant stenosis, or occlusion. Skeleton: There is mild degenerative change of the cervical spine, most advanced at C4-C5. Other neck: The thyroid is diffusely enlarged and heterogeneous with a single nodule measuring up to 1.4 cm. A right-sided dialysis catheter is noted. The tip is not evaluated. There is a mucous retention cyst in the left sphenoid sinus. There are numerous dental caries and periapical lucencies around the roots of multiple teeth, most notably the posterior most left maxillary molar. Upper chest: The lung apices are clear. Review of the MIP images confirms the above findings CTA HEAD FINDINGS Anterior circulation: There is calcification of the bilateral cavernous ICAs without hemodynamically significant stenosis or  occlusion. The bilateral MCAs and ACAs are patent. There is no aneurysm. Posterior circulation: The V4 segments of the vertebral arteries are patent. The basilar artery is patent. The bilateral PCAs are patent. There is no aneurysm. Venous sinuses: As permitted by contrast timing, patent. Anatomic variants:  None. Review of the MIP images confirms the above findings CT Brain Perfusion Findings: ASPECTS: 10 CBF (<30%) Volume: 50m Perfusion (Tmax>6.0s) volume: 054mMismatch Volume: n/a Infarction Location:n/a IMPRESSION: 1. No large vessel occlusion or dissection identified. 2. No infarct core or penumbra identified. 3. Approximately 50% stenosis of the proximal right internal carotid artery and mild calcification of the bilateral cavernous ICAs without hemodynamically significant stenosis. Otherwise, patent vasculature of the head and neck. 4. Enlarged and heterogeneous thyroid with a 1.4 cm nodule in the left. No specific imaging follow-up is required for this nodule. 5. Extensive dental disease as detailed above. Electronically Signed   By: PeValetta Mole.D.   On: 01/12/2021 16:13   DG Chest Port 1 View  Result Date: 01/12/2021 CLINICAL DATA:  Chest pain EXAM: PORTABLE CHEST 1 VIEW COMPARISON:  Chest radiograph 01/11/2021 FINDINGS: A right-sided dialysis catheter is stable in position. The cardiomediastinal silhouette is stable. The lungs clear, with no focal consolidation or pulmonary edema. There is no pleural effusion or pneumothorax. The bones are unremarkable. IMPRESSION: No radiographic evidence of acute cardiopulmonary process. Electronically Signed   By: PeValetta Mole.D.   On: 01/12/2021 16:20   CT HEAD CODE STROKE WO CONTRAST  Result Date: 01/12/2021 CLINICAL DATA:  Code stroke.  Slurred speech, weakness EXAM: CT HEAD WITHOUT CONTRAST TECHNIQUE: Contiguous axial images were obtained from the base of the skull through the vertex without intravenous contrast. COMPARISON:  CT head 01/11/2021 FINDINGS:  Brain: There is no evidence of acute intracranial hemorrhage, extra-axial fluid collection, or infarct. Small remote lacunar infarcts in the left basal ganglia are noted. Additional foci of hypodensity in the subcortical and periventricular white matter are nonspecific but may reflect sequela of multiple sclerosis, unchanged. There is no mass lesion. There is no midline shift. The ventricles are stable in size. Vascular: No hyperdense vessel or unexpected calcification. Skull: Normal. Negative for fracture or focal lesion. Sinuses/Orbits: No acute finding. Other: None. ASPECTS ([facility] Score) - Ganglionic level infarction (caudate, lentiform nuclei, internal capsule, insula, M1-M3 cortex): 7 - Supraganglionic infarction (M4-M6 cortex): 3 Total score (0-10 with 10 being normal): 10 IMPRESSION: 1. No acute intracranial pathology. 2. ASPECTS is 10. 3. If there is concern for infarct or active demyelination, MRI with and without contrast is recommended. These results were called by telephone at the time of interpretation on 01/12/2021 at 2:37 pm to provider ANLacretia Leigh who verbally acknowledged these results. Electronically Signed   By: PeValetta Mole.D.   On: 01/12/2021 14:38    EKG: Independently reviewed.  Sinus rhythm with no significant ST elevation or depression  Assessment/Plan  Altered mental status/worsening slurring of speech and right-sided weakness in a patient with history of unspecified CVAs and residual right-sided weakness and speech abnormality -Code stroke was called.  CT of the head was negative for acute intracranial bleed.  EKG without any acute ischemic findings.  Neurology was consulted by ED provider who recommended TNKase but patient refused it.  CT angio of head showed no hemodynamically significant stenosis or occlusion. -As per ED provider, patient mental status has improved significantly since presenting to the ED including patient being more awake and  improving speech and right-sided weakness. -We will place in observation for TIA/rule out stroke work-up.  MRI of brain without contrast, 2D echo, lipid profile, hemoglobin A1c, PT/OT/SLP evaluation. -Allow permissive hypertension, hold regular home antihypertensives for now -I have communicated with Dr. SeLeonie Mania secure chat and patient will  be evaluated by neurology/stroke team once patient gets to [facility]. -Patient already on Plavix at home.  Continue Plavix.  Add aspirin for now.  Empirically start Lipitor 80 mg nightly  End-stage renal disease on hemodialysis -Completed hemodialysis today.  No signs of volume overload.  Sent message to Dr. Webb/nephrology via secure chat regarding consultation.  Diabetes mellitus type 2 -If patient passes stroke swallow eval at bedside, we will put her on carb modified/heart healthy diet -CBGs with SSI.  Continue long-acting insulin nightly but at a reduced dose  Anemia of chronic disease -From renal failure.  Hemoglobin stable.  No signs of bleeding.  Chronic right upper extremity pain -Recently started on methadone which patient did not tolerate.  Will not continue methadone.  We will hold off on any sedatives as well for now.  Outpatient follow-up with PCP.    History of multiple sclerosis -Follow neurology recommendations  DVT prophylaxis: Heparin subcutaneous Code Status: Full Family Communication: 1 daughter at bedside and other daughter on phone Disposition Plan: Home in 1 to 2 days once clinically improves Consults called: Communicated with Dr. Leonie Man and Dr. Stack/neurology.  Sent secure chat to Dr. Webb/nephrology Admission status: Observation/telemetry  Severity of Illness: The appropriate patient status for this patient is OBSERVATION. Observation status is judged to be reasonable and necessary in order to provide the required intensity of service to ensure the patient's safety. The patient's presenting symptoms, physical  exam findings, and initial radiographic and laboratory data in the context of their medical condition is felt to place them at decreased risk for further clinical deterioration. Furthermore, it is anticipated that the patient will be medically stable for discharge from the hospital within 2 midnights of admission. The following factors support the patient status of observation.   " The patient's presenting symptoms include altered mental status/worsening right-sided weakness and slurred speech. " The physical exam findings include slurred speech/right-sided weakness. " The initial radiographic and laboratory data are CT head was negative for acute intracranial abnormality.    Aline August MD [facility]  01/12/2021, 5:14 PM

## 2021-01-12 NOTE — ED Provider Notes (Signed)
[facility] DEPT Provider Note   CSN: [id number] Arrival date & time: 01/12/21  1400     History Chief Complaint  Patient presents with   Altered Mental Status    Hannah [name] is a 55 y.o. female.  55 year old female presents with altered mental status.  Patient has history of ESRD and had dialysis today for normal session.  Felt fine afterwards and according to the daughter while driving to her next appointment felt weak and had some transient chest discomfort.  She then was not as responsive.  There was no LOC.  She denies any headache.  No opiates in the last 12 hours.  Did have a recent ER visit and was discharged yesterday.  No reported fevers.  Patient has a history of CVA in the past and has resultant right-sided deficits.  Daughter states that her right facial droop is about baseline but that she has worsening right upper extremity weakness.  Presents by private vehicle      Past Medical History:  Diagnosis Date   Cerebral infarction ([place]) 07/12/2017   2009 first one, has had a total of 4 stokes   Chronic kidney disease    tues thurs sat - dialysis   Diabetes type 2, controlled ([place])    Hemiparesis affecting right side as late effect of cerebrovascular accident (CVA) ([place])    Hyperlipidemia    Hypertension    Multiple sclerosis ([place]) 2009   Stroke [facility])    Walker as ambulation aid     Patient Active Problem List   Diagnosis Date Noted   Other chronic postprocedural pain 01/09/2021   Past heart attack 11/13/2020   Spastic hemiplegia due to cerebrovascular disease ([place]) 11/03/2020   Debility 10/18/2020   ESRD on hemodialysis (HCC)    SOB (shortness of breath) 09/27/2020   Volume overload 09/26/2020   CKD (chronic kidney disease) stage 5, GFR less than 15 ml/min ([place]) 09/13/2020   H/O: CVA (cerebrovascular accident) 09/12/2020   Acute blood loss anemia    Chronic kidney disease (CKD), stage IV (severe) ([place])    Uncontrolled type 2  diabetes mellitus with hyperglycemia ([place])    Essential hypertension    Right pontine cerebrovascular accident ([place]) 06/12/2020   Stroke-like symptoms 06/09/2020   Acute kidney injury superimposed on CKD ([place]) 06/09/2020   Cerebral thrombosis with cerebral infarction 06/09/2020   Hypertension    Hyperlipidemia    Diabetes type 2, controlled ([place])    Bimalleolar ankle fracture 11/24/2018   Lower abdominal pain 07/28/2017   Bladder mass 07/18/2017   Painless hematuria 07/18/2017   Intractable vomiting with nausea 07/12/2017   Hemiparesis affecting right side as late effect of cerebrovascular accident (CVA) ([place]) 09/22/2016   AKI (acute kidney injury) ([place]) 01/08/2016   CAD (coronary artery disease) 01/07/2016   Multiple sclerosis ([place]) 2009    Past Surgical History:  Procedure Laterality Date   APPENDECTOMY     AV FISTULA PLACEMENT Right 10/02/2020   Procedure: RIGHT ARTERIOVENOUS (AV) FISTULA CREATION;  Surgeon: Waynetta Sandy, MD;  Location: [facility];  Service: Vascular;  Laterality: Right;   [place] Right 11/24/2020   Procedure: SECOND STAGE RIGHT ARM [place];  Surgeon: Marty Heck, MD;  Location: [facility];  Service: Vascular;  Laterality: Right;   CYSTOSTOMY W/ BLADDER BIOPSY     IR FLUORO GUIDE CV LINE RIGHT  09/29/2020   IR US GUIDE VASC ACCESS RIGHT  09/29/2020   OVARIAN CYST SURGERY  stent placed in heart       OB History   No obstetric history on file.     No family history on file.  Social History   Tobacco Use   Smoking status: Never   Smokeless tobacco: Never  Vaping Use   Vaping Use: Never used  Substance Use Topics   Alcohol use: Not Currently   Drug use: Never    Home Medications Prior to Admission medications   Medication Sig Start Date End Date Taking? Authorizing Provider  amLODipine (NORVASC) 10 MG tablet Take 1 tablet (10 mg total) by mouth daily. 11/03/20   Love, Ivan Anchors, PA-C  calcium  acetate (PHOSLO) 667 MG capsule Take 667 mg by mouth See admin instructions. Take 667 mg by mouth with each meal and snacks 11/11/20   [provider]  carvedilol (COREG) 25 MG tablet Take 1 tablet (25 mg total) by mouth 2 (two) times daily with a meal. 11/03/20   Love, Ivan Anchors, PA-C  clopidogrel (PLAVIX) 75 MG tablet Take 1 tablet (75 mg total) by mouth daily. 11/03/20   Love, Ivan Anchors, PA-C  Continuous Blood Gluc Sensor (FREESTYLE LIBRE 2 SENSOR) MISC USE AS DIRECTED CHANGE EVERY 14 DAYS 12/30/20   [provider]  dantrolene (DANTRIUM) 100 MG capsule Take 1 capsule (100 mg total) by mouth 2 (two) times daily. 01/09/21   Lovorn, Jinny Blossom, MD  diclofenac Sodium (VOLTAREN) 1 % GEL Apply 2 g topically 4 (four) times daily. To leg Patient taking differently: Apply 2 g topically 4 (four) times daily as needed (pain). To leg 11/03/20   Love, Ivan Anchors, PA-C  famotidine (PEPCID) 20 MG tablet Take 1 tablet (20 mg total) by mouth daily. 11/03/20   Love, Ivan Anchors, PA-C  gabapentin (NEURONTIN) 300 MG capsule Take 1 capsule (300 mg total) by mouth at bedtime. 12/16/20   Marty Heck, MD  hydrALAZINE (APRESOLINE) 50 MG tablet Take 1 tablet (50 mg total) by mouth every 8 (eight) hours. 11/03/20   Love, Ivan Anchors, PA-C  hydrOXYzine (ATARAX/VISTARIL) 10 MG tablet Take 1 tablet (10 mg total) by mouth 3 (three) times daily as needed for up to 7 days for itching. 01/11/21 01/18/21  Horton, Alvin Critchley, DO  insulin degludec (TRESIBA FLEXTOUCH) 100 UNIT/ML FlexTouch Pen Inject 12 Units into the skin at bedtime. Patient taking differently: Inject 8-12 Units into the skin at bedtime. Sliding scale 11/03/20   Love, Ivan Anchors, PA-C  insulin lispro (HUMALOG) 100 UNIT/ML KwikPen Inject 5 Units into the skin 3 (three) times daily. 11/04/20   Love, Ivan Anchors, PA-C  melatonin 5 MG TABS Take 5 mg by mouth at bedtime.    [provider]  methadone (DOLOPHINE) 5 MG tablet Take 1 tablet (5 mg total) by mouth every 12  (twelve) hours. 01/09/21   Lovorn, Jinny Blossom, MD  nitroGLYCERIN (NITROSTAT) 0.4 MG SL tablet Place 1 tablet (0.4 mg total) under the tongue every 5 (five) minutes as needed for chest pain. 06/25/20   Angiulli, Lavon Paganini, PA-C  oxyCODONE-acetaminophen (PERCOCET/ROXICET) 5-325 MG tablet Take 1 tablet by mouth every 6 (six) hours as needed for up to 3 days for severe pain (Patient taking differently, states that she is taking only 1/2 tablet). 01/11/21 01/14/21  Horton, Alvin Critchley, DO  polyethylene glycol (MIRALAX / GLYCOLAX) 17 g packet Take 17 g by mouth daily. Patient taking differently: Take 17 g by mouth daily as needed for moderate constipation. 11/04/20   Bary Leriche, PA-C  senna-docusate (SENOKOT-S) 8.6-50 MG tablet Take 1 tablet by mouth 2 (two) times daily as needed for moderate constipation. 11/03/20   Love, Ivan Anchors, PA-C  tiZANidine (ZANAFLEX) 2 MG tablet Take 1 tablet (2 mg total) by mouth at bedtime. 11/03/20   Love, Ivan Anchors, PA-C    Allergies    Ibuprofen, Influenza vaccines, Pneumococcal vaccine, Promethazine, Ambien [zolpidem], Cyclobenzaprine, Tape, Tramadol hcl, and Oxycodone  Review of Systems   Review of Systems  All other systems reviewed and are negative.  Physical Exam Updated Vital Signs BP (!) 163/73   Pulse 85   Temp 98.5 F (36.9 C) (Oral)   Resp 16   Ht 1.575 m ('5\' 2"'$ )   Wt 52.2 kg   SpO2 96%   BMI 21.03 kg/m   Physical Exam Vitals and nursing note reviewed.  Constitutional:      General: She is not in acute distress.    Appearance: Normal appearance. She is well-developed. She is not toxic-appearing.  HENT:     Head: Normocephalic and atraumatic.  Eyes:     General: Lids are normal.     Conjunctiva/sclera: Conjunctivae normal.     Pupils: Pupils are equal, round, and reactive to light.  Neck:     Thyroid: No thyroid mass.     Trachea: No tracheal deviation.  Cardiovascular:     Rate and Rhythm: Normal rate and regular rhythm.     Heart sounds: Normal  heart sounds. No murmur heard.   No gallop.  Pulmonary:     Effort: Pulmonary effort is normal. No respiratory distress.     Breath sounds: Normal breath sounds. No stridor. No decreased breath sounds, wheezing, rhonchi or rales.  Abdominal:     General: There is no distension.     Palpations: Abdomen is soft.     Tenderness: There is no abdominal tenderness. There is no rebound.  Musculoskeletal:        General: No tenderness. Normal range of motion.     Cervical back: Normal range of motion and neck supple.  Skin:    General: Skin is warm and dry.     Findings: No abrasion or rash.  Neurological:     Mental Status: She is oriented to person, place, and time. She is lethargic.     GCS: GCS eye subscore is 4. GCS verbal subscore is 5. GCS motor subscore is 6.     Cranial Nerves: Cranial nerve deficit present.     Motor: Weakness present.     Comments: Speech slurred which is different from baseline.  Right upper extremity strength is 3-5.  Bilateral lower extremity strength is 4-5.  Left side normal.  Right-sided facial droop  Psychiatric:        Attention and Perception: She is inattentive.        Speech: Speech is delayed.        Behavior: Behavior is slowed.    ED Results / Procedures / Treatments   Labs (all labs ordered are listed, but only abnormal results are displayed) Labs Reviewed  RESP PANEL BY RT-PCR (FLU A&B, COVID) ARPGX2  ETHANOL  PROTIME-INR  APTT  CBC  DIFFERENTIAL  COMPREHENSIVE METABOLIC PANEL  RAPID URINE DRUG SCREEN, HOSP PERFORMED  URINALYSIS, ROUTINE W REFLEX MICROSCOPIC  I-STAT CHEM 8, ED  I-STAT BETA HCG BLOOD, ED ([facility], [facility], [facility] ONLY)    EKG EKG Interpretation  Date/Time:  Monday January 12 2021 14:04:42 EDT Ventricular Rate:  92 PR Interval:  163 QRS  Duration: 87 QT Interval:  369 QTC Calculation: 457 R Axis:   45 Text Interpretation: Sinus rhythm Probable left atrial enlargement RSR' in V1 or V2, probably normal variant Borderline T wave  abnormalities Minimal ST elevation, anterior leads Confirmed by Lacretia Leigh ([id number]) on 01/12/2021 3:32:02 PM  Radiology DG Chest 1 View  Result Date: 01/11/2021 CLINICAL DATA:  Weakness. EXAM: CHEST  1 VIEW COMPARISON:  December 18, 2020. FINDINGS: The heart size and mediastinal contours are within normal limits. Both lungs are clear. Right internal jugular dialysis catheter is unchanged. The visualized skeletal structures are unremarkable. IMPRESSION: No active disease. Electronically Signed   By: Marijo Conception M.D.   On: 01/11/2021 10:58   CT HEAD WO CONTRAST (5MM)  Result Date: 01/11/2021 CLINICAL DATA:  Dizziness, non-specific EXAM: CT HEAD WITHOUT CONTRAST TECHNIQUE: Contiguous axial images were obtained from the base of the skull through the vertex without intravenous contrast. COMPARISON:  Oct 03, 2020, June 09, 2020 FINDINGS: Brain: No evidence of acute infarction, hemorrhage, hydrocephalus, extra-axial collection or mass lesion/mass effect. Again noted are multiple areas of hypodensity predominantly involving the LEFT greater than RIGHT periventricular and subcortical white matter. This likely reflects the sequela of chronic microvascular ischemic disease superimposed on reported history of multiple sclerosis. There is a focal area of cortical hypodensity in the LEFT parietal lobe which is unchanged since June 09, 2020 MRI and likely reflects sequela of prior infarction (series 2, image 23). Remote bilateral basal ganglia lacunar infarction. Hypodensity of the pons, unchanged. Vascular: Mild vascular calcifications of the carotid siphons. Skull: Normal. Negative for fracture or focal lesion. Sinuses/Orbits: No acute finding. Other: None. IMPRESSION: No acute intracranial abnormality. If concern for acute stroke or active demyelinating lesion, recommend dedicated brain MRI with and without contrast. Electronically Signed   By: Valentino Saxon M.D.   On: 01/11/2021 10:19     Procedures Procedures   Medications Ordered in ED Medications - No data to display  ED Course  I have reviewed the triage vital signs and the nursing notes.  Pertinent labs & imaging results that were available during my care of the patient were reviewed by me and considered in my medical decision making (see chart for details).    MDM Rules/Calculators/A&P                           Patient called code stroke after my assessment.  Patient had complained of some atypical chest pain.  Not really clear about quality of this.  EKG without acute ischemic findings.  Head CT without acute bleed.  Neurology recommended patient have TNKase which patient has deferred.  Recommend CT angio with perfusion of brain.  If negative for large vessel occlusion, patient can be managed at [facility].  Otherwise we will need to contact neurology for transfer to [facility] Final Clinical Impression(s) / ED Diagnoses Final diagnoses:  None    Rx / DC Orders ED Discharge Orders     None        Lacretia Leigh, MD 01/13/21 1444

## 2021-01-12 NOTE — ED Triage Notes (Signed)
Pt presents to ED via POV cc chest pain, ams. Per daughter, pt had complete dialysis run today. Daughter states she began to c/o cp after dialysis and had "glazed over" look in her eyes. Pt unable to move without assistance. Pt responds to voice, A&ox4.

## 2021-01-12 NOTE — ED Notes (Signed)
Attempted to call report 2nd time. [facility] nurse will call back.

## 2021-01-13 ENCOUNTER — Observation Stay (HOSPITAL_BASED_OUTPATIENT_CLINIC_OR_DEPARTMENT_OTHER): Payer: Medicare Other

## 2021-01-13 DIAGNOSIS — Z7982 Long term (current) use of aspirin: Secondary | ICD-10-CM | POA: Diagnosis not present

## 2021-01-13 DIAGNOSIS — Z886 Allergy status to analgesic agent status: Secondary | ICD-10-CM | POA: Diagnosis not present

## 2021-01-13 DIAGNOSIS — Y900 Blood alcohol level of less than 20 mg/100 ml: Secondary | ICD-10-CM | POA: Diagnosis not present

## 2021-01-13 DIAGNOSIS — Z794 Long term (current) use of insulin: Secondary | ICD-10-CM | POA: Diagnosis not present

## 2021-01-13 DIAGNOSIS — I120 Hypertensive chronic kidney disease with stage 5 chronic kidney disease or end stage renal disease: Secondary | ICD-10-CM | POA: Diagnosis not present

## 2021-01-13 DIAGNOSIS — E1122 Type 2 diabetes mellitus with diabetic chronic kidney disease: Secondary | ICD-10-CM | POA: Diagnosis not present

## 2021-01-13 DIAGNOSIS — Z79899 Other long term (current) drug therapy: Secondary | ICD-10-CM | POA: Diagnosis not present

## 2021-01-13 DIAGNOSIS — R2681 Unsteadiness on feet: Secondary | ICD-10-CM | POA: Diagnosis not present

## 2021-01-13 DIAGNOSIS — Z7902 Long term (current) use of antithrombotics/antiplatelets: Secondary | ICD-10-CM | POA: Diagnosis not present

## 2021-01-13 DIAGNOSIS — R4182 Altered mental status, unspecified: Secondary | ICD-10-CM | POA: Diagnosis not present

## 2021-01-13 DIAGNOSIS — I252 Old myocardial infarction: Secondary | ICD-10-CM | POA: Diagnosis not present

## 2021-01-13 DIAGNOSIS — Z888 Allergy status to other drugs, medicaments and biological substances status: Secondary | ICD-10-CM | POA: Diagnosis not present

## 2021-01-13 DIAGNOSIS — I69951 Hemiplegia and hemiparesis following unspecified cerebrovascular disease affecting right dominant side: Secondary | ICD-10-CM | POA: Diagnosis not present

## 2021-01-13 DIAGNOSIS — Z885 Allergy status to narcotic agent status: Secondary | ICD-10-CM | POA: Diagnosis not present

## 2021-01-13 DIAGNOSIS — Z87891 Personal history of nicotine dependence: Secondary | ICD-10-CM | POA: Diagnosis not present

## 2021-01-13 DIAGNOSIS — I2510 Atherosclerotic heart disease of native coronary artery without angina pectoris: Secondary | ICD-10-CM | POA: Diagnosis not present

## 2021-01-13 DIAGNOSIS — N186 End stage renal disease: Secondary | ICD-10-CM | POA: Diagnosis not present

## 2021-01-13 DIAGNOSIS — I10 Essential (primary) hypertension: Secondary | ICD-10-CM

## 2021-01-13 DIAGNOSIS — R0789 Other chest pain: Secondary | ICD-10-CM | POA: Diagnosis present

## 2021-01-13 DIAGNOSIS — I69928 Other speech and language deficits following unspecified cerebrovascular disease: Secondary | ICD-10-CM | POA: Diagnosis not present

## 2021-01-13 DIAGNOSIS — Z955 Presence of coronary angioplasty implant and graft: Secondary | ICD-10-CM | POA: Diagnosis not present

## 2021-01-13 DIAGNOSIS — U071 COVID-19: Secondary | ICD-10-CM | POA: Diagnosis not present

## 2021-01-13 DIAGNOSIS — Z992 Dependence on renal dialysis: Secondary | ICD-10-CM | POA: Diagnosis not present

## 2021-01-13 DIAGNOSIS — M6281 Muscle weakness (generalized): Secondary | ICD-10-CM | POA: Diagnosis not present

## 2021-01-13 DIAGNOSIS — Z887 Allergy status to serum and vaccine status: Secondary | ICD-10-CM | POA: Diagnosis not present

## 2021-01-13 LAB — COMPREHENSIVE METABOLIC PANEL
ALT: 8 U/L (ref 0–44)
AST: 13 U/L — ABNORMAL LOW (ref 15–41)
Albumin: 3 g/dL — ABNORMAL LOW (ref 3.5–5.0)
Alkaline Phosphatase: 46 U/L (ref 38–126)
Anion gap: 7 (ref 5–15)
BUN: 7 mg/dL (ref 6–20)
CO2: 29 mmol/L (ref 22–32)
Calcium: 9 mg/dL (ref 8.9–10.3)
Chloride: 97 mmol/L — ABNORMAL LOW (ref 98–111)
Creatinine, Ser: 2.39 mg/dL — ABNORMAL HIGH (ref 0.44–1.00)
GFR, Estimated: 23 mL/min — ABNORMAL LOW (ref >60)
Glucose, Bld: 128 mg/dL — ABNORMAL HIGH (ref 70–99)
Potassium: 3.5 mmol/L (ref 3.5–5.1)
Sodium: 133 mmol/L — ABNORMAL LOW (ref 135–145)
Total Bilirubin: 0.5 mg/dL (ref 0.3–1.2)
Total Protein: 6.2 g/dL — ABNORMAL LOW (ref 6.5–8.1)

## 2021-01-13 LAB — CBC WITH DIFFERENTIAL/PLATELET
Abs Immature Granulocytes: 0.01 10*3/uL (ref 0.00–0.07)
Basophils Absolute: 0 10*3/uL (ref 0.0–0.1)
Basophils Relative: 0 %
Eosinophils Absolute: 0.2 10*3/uL (ref 0.0–0.5)
Eosinophils Relative: 4 %
HCT: 26.5 % — ABNORMAL LOW (ref 36.0–46.0)
Hemoglobin: 8.7 g/dL — ABNORMAL LOW (ref 12.0–15.0)
Immature Granulocytes: 0 %
Lymphocytes Relative: 33 %
Lymphs Abs: 1.5 10*3/uL (ref 0.7–4.0)
MCH: 27.8 pg (ref 26.0–34.0)
MCHC: 32.8 g/dL (ref 30.0–36.0)
MCV: 84.7 fL (ref 80.0–100.0)
Monocytes Absolute: 0.4 10*3/uL (ref 0.1–1.0)
Monocytes Relative: 8 %
Neutro Abs: 2.5 10*3/uL (ref 1.7–7.7)
Neutrophils Relative %: 55 %
Platelets: 157 10*3/uL (ref 150–400)
RBC: 3.13 MIL/uL — ABNORMAL LOW (ref 3.87–5.11)
RDW: 14.6 % (ref 11.5–15.5)
WBC: 4.6 10*3/uL (ref 4.0–10.5)
nRBC: 0 % (ref 0.0–0.2)

## 2021-01-13 LAB — LIPID PANEL
Cholesterol: 201 mg/dL — ABNORMAL HIGH (ref 0–200)
HDL: 45 mg/dL (ref >40)
LDL Cholesterol: 121 mg/dL — ABNORMAL HIGH (ref 0–99)
Total CHOL/HDL Ratio: 4.5 RATIO
Triglycerides: 174 mg/dL — ABNORMAL HIGH (ref <150)
VLDL: 35 mg/dL (ref 0–40)

## 2021-01-13 LAB — ECHOCARDIOGRAM LIMITED
Area-P 1/2: 3.6 cm2
Height: 62 in
S' Lateral: 2.21 cm
Weight: 1840 oz

## 2021-01-13 LAB — GLUCOSE, CAPILLARY
Glucose-Capillary: 152 mg/dL — ABNORMAL HIGH (ref 70–99)
Glucose-Capillary: 86 mg/dL (ref 70–99)
Glucose-Capillary: 88 mg/dL (ref 70–99)

## 2021-01-13 LAB — VITAMIN B12: Vitamin B-12: 566 pg/mL (ref 180–914)

## 2021-01-13 LAB — HEMOGLOBIN A1C
Hgb A1c MFr Bld: 8.4 % — ABNORMAL HIGH (ref 4.8–5.6)
Mean Plasma Glucose: 194.38 mg/dL

## 2021-01-13 LAB — CBG MONITORING, ED: Glucose-Capillary: 159 mg/dL — ABNORMAL HIGH (ref 70–99)

## 2021-01-13 LAB — TSH: TSH: 0.966 u[IU]/mL (ref 0.350–4.500)

## 2021-01-13 LAB — AMMONIA: Ammonia: 10 umol/L (ref 9–35)

## 2021-01-13 MED ORDER — OXYCODONE-ACETAMINOPHEN 5-325 MG PO TABS
1.0000 | ORAL_TABLET | Freq: Four times a day (QID) | ORAL | Status: DC | PRN
  Administered 2021-01-13: 1 via ORAL
  Filled 2021-01-13: qty 1

## 2021-01-13 MED ORDER — CHLORHEXIDINE GLUCONATE CLOTH 2 % EX PADS
6.0000 | MEDICATED_PAD | Freq: Every day | CUTANEOUS | Status: DC
  Administered 2021-01-13: 6 via TOPICAL

## 2021-01-13 MED ORDER — CARVEDILOL 12.5 MG PO TABS
25.0000 mg | ORAL_TABLET | Freq: Two times a day (BID) | ORAL | Status: DC
  Administered 2021-01-13: 25 mg via ORAL
  Filled 2021-01-13: qty 2

## 2021-01-13 MED ORDER — TIZANIDINE HCL 4 MG PO TABS: 2.0000 mg | ORAL_TABLET | Freq: Every day | ORAL | Status: DC

## 2021-01-13 MED ORDER — AMLODIPINE BESYLATE 10 MG PO TABS
10.0000 mg | ORAL_TABLET | Freq: Every day | ORAL | Status: DC
  Administered 2021-01-13: 10 mg via ORAL
  Filled 2021-01-13: qty 1

## 2021-01-13 MED ORDER — CHLORHEXIDINE GLUCONATE CLOTH 2 % EX PADS: 6.0000 | MEDICATED_PAD | Freq: Every day | CUTANEOUS | Status: DC

## 2021-01-13 MED ORDER — DANTROLENE SODIUM 100 MG PO CAPS
100.0000 mg | ORAL_CAPSULE | Freq: Two times a day (BID) | ORAL | Status: DC
  Administered 2021-01-13: 100 mg via ORAL
  Filled 2021-01-13 (×3): qty 1

## 2021-01-13 MED ORDER — ATORVASTATIN CALCIUM 80 MG PO TABS: 80.0000 mg | ORAL_TABLET | Freq: Every day | ORAL | 0 refills | Status: DC

## 2021-01-13 MED ORDER — GABAPENTIN 300 MG PO CAPS: 300.0000 mg | ORAL_CAPSULE | Freq: Every day | ORAL | Status: DC

## 2021-01-13 NOTE — Progress Notes (Signed)
Received pt from [facility] via [facility], alert x4, implemented MD orders, place on tele and verified, call light in reach,   01/13/21 0232  Vitals  Temp 98.7 F (37.1 C)  Temp Source Oral  BP 138/72  MAP (mmHg) 93  BP Location Left Arm  BP Method Automatic  Patient Position (if appropriate) Lying  Pulse Rate 85  Pulse Rate Source Monitor  Resp 16  MEWS COLOR  MEWS Score Color Green  Oxygen Therapy  SpO2 100 %  O2 Device Room Air  MEWS Score  MEWS Temp 0  MEWS Systolic 0  MEWS Pulse 0  MEWS RR 0  MEWS LOC 0  MEWS Score 0

## 2021-01-13 NOTE — Progress Notes (Signed)
Order to discharge pt home.  Discharge instructions/AVS given to patient - spoke with daughter via phone and informed her medications and next dose in packet with mother.  Pt advised to call PCP and/or come back to the hospital if there are any problems. Pt verbalized understanding.

## 2021-01-13 NOTE — Progress Notes (Signed)
Occupational Therapy Evaluation Patient Details Name: Hannah [name] MRN: [id number] DOB: [birth year] Today's Date: 01/13/2021    History of Present Illness 55 y.o. female presenting with altered mental status, N/V  and worsening dysarthria. MRI (-) for acute CVA. Covid +. Past medical history: end-stage renal disease on hemodialysis, hypertension, hyperlipidemia, diabetes mellitus type 2, unspecified multiple CVA with residual right-sided hemiparesis and some speech difficulties,  multiple sclerosis, chronic right upper extremity pain, recently placed on methadone.   Clinical Impression   PTA pt lives with daughters who assist as needed with mobility and ADL. Since having Covid, pt generally weaker and has been mobilizing primarily at wc level with min A. Pt's cognition is most likely at baseline. Pt appropriate to DC home with HHOT when medically appropriate. Will follow acutely. Pt very appreciative.     Follow Up Recommendations  Home health OT    Equipment Recommendations  None recommended by OT    Recommendations for Other Services       Precautions / Restrictions Precautions Precautions: Fall Precaution Comments: Painful RUE; painful AV fistula site      Mobility Bed Mobility Overal bed mobility: Needs Assistance Bed Mobility: Supine to Sit     Supine to sit: Min guard          Transfers Overall transfer level: Needs assistance   Transfers: Sit to/from Stand;Stand Pivot Transfers Sit to Stand: Min assist;+2 safety/equipment Stand pivot transfers: Min assist            Balance Overall balance assessment: Needs assistance   Sitting balance-Leahy Scale: Fair       Standing balance-Leahy Scale: Poor                             ADL either performed or assessed with clinical judgement   ADL Overall ADL's : Needs assistance/impaired Eating/Feeding: Set up   Grooming: Set up   Upper Body Bathing: Minimal assistance   Lower Body Bathing:  Minimal assistance;Sit to/from stand   Upper Body Dressing : Moderate assistance   Lower Body Dressing: Moderate assistance   Toilet Transfer: Minimal assistance   Toileting- Clothing Manipulation and Hygiene: Moderate assistance       Functional mobility during ADLs: Minimal assistance;+2 for safety/equipment (HHA; painful with pushing down through R hand)       Vision Baseline Vision/History: 1 Wears glasses Additional Comments: Difficulty with vision at [place]; follow with eye doctor     Perception     Praxis      Pertinent Vitals/Pain Pain Assessment: Faces Faces Pain Scale: Hurts even more Pain Location: RUE Pain Descriptors / Indicators: Aching;Discomfort;Grimacing Pain Intervention(s): Limited activity within patient's tolerance;Repositioned     Hand Dominance Left   Extremity/Trunk Assessment Upper Extremity Assessment Upper Extremity Assessment: RUE deficits/detail RUE Deficits / Details: hemiparesis; decreased movement and increased pain since AV fistula placement; shoulder ROM restricted with poor scapular glide; elbow @ 20 from full extension; edema around fistula site RUE Sensation:  (abnormal forearm since fistual placement) RUE Coordination: decreased fine motor;decreased gross motor   Lower Extremity Assessment Lower Extremity Assessment: Defer to PT evaluation   Cervical / Trunk Assessment Cervical / Trunk Assessment: Other exceptions (forward head; R bias)   Communication Communication Communication: Expressive difficulties   Cognition Arousal/Alertness: Awake/alert Behavior During Therapy: WFL for tasks assessed/performed Overall Cognitive Status: Within Functional Limits for tasks assessed  General Comments: most likely close to baseline; able to give accurate health information and good problem solving. Scored a 0/29 on the Short Blessed Test, indicating cognition WNL.   General Comments        Exercises Exercises: Other exercises Other Exercises Other Exercises: lap slides Other Exercises: scap retraction Other Exercises: squeeze ball and theraputty for grip and pinch strengthening   Shoulder Instructions      Home Living Family/patient expects to be discharged to:: Private residence Living Arrangements: Children Available Help at Discharge: Family;Available 24 hours/day Type of Home: Apartment Home Access: Level entry;Stairs to enter Entrance Stairs-Number of Steps: 1 step up on curb   Home Layout: One level     Bathroom Shower/Tub: Teacher, early years/pre: Handicapped height Bathroom Accessibility: Yes How Accessible: Accessible via walker Home Equipment: [place] - 4 wheels;Cane - single point;Wheelchair - Education officer, community - power;Tub bench   Additional Comments: no longer has power wc      Prior Functioning/Environment Level of Independence: Needs assistance  Gait / Transfers Assistance Needed: was walking with RW before Covid; since Covid has mostly been using wc level mobility with assistance of daughters; daughter bumps her up curb in wc ADL's / Homemaking Assistance Needed: bathes/dresses herself; daughter assists as needed   Comments: Has AFO at home but does not use it        OT Problem List: Decreased strength;Decreased range of motion;Decreased activity tolerance;Impaired balance (sitting and/or standing);Impaired vision/perception;Decreased coordination;Decreased safety awareness;Decreased knowledge of use of DME or AE;Impaired tone;Impaired UE functional use;Pain;Increased edema      OT Treatment/Interventions: Self-care/ADL training;Therapeutic exercise;Neuromuscular education;Energy conservation;DME and/or AE instruction;Therapeutic activities;Visual/perceptual remediation/compensation;Patient/family education;Balance training    OT Goals(Current goals can be found in the care plan section) Acute Rehab OT Goals Patient Stated Goal: to  go home OT Goal Formulation: With patient Time For Goal Achievement: 01/26/21 Potential to Achieve Goals: Good  OT Frequency: Min 2X/week   Barriers to D/C:            Co-evaluation PT/OT/SLP Co-Evaluation/Treatment: Yes Reason for Co-Treatment: For patient/therapist safety;To address functional/ADL transfers   OT goals addressed during session: ADL's and self-care      AM-PAC OT "6 Clicks" Daily Activity     Outcome Measure Help from another person eating meals?: A Little Help from another person taking care of personal grooming?: A Little Help from another person toileting, which includes using toliet, bedpan, or urinal?: A Lot Help from another person bathing (including washing, rinsing, drying)?: A Little Help from another person to put on and taking off regular upper body clothing?: A Lot Help from another person to put on and taking off regular lower body clothing?: A Lot 6 Click Score: 15   End of Session Equipment Utilized During Treatment: Gait belt;Rolling walker Nurse Communication: Mobility status;Other (comment) (painful IV site)  Activity Tolerance: Patient tolerated treatment well Patient left: in chair;with call bell/phone within reach;with chair alarm set  OT Visit Diagnosis: Unsteadiness on feet (R26.81);Other abnormalities of gait and mobility (R26.89);Muscle weakness (generalized) (M62.81);Pain Pain - Right/Left: Right Pain - part of body: Arm;Shoulder;Hand (L hand)                Time: 1020-1100 OT Time Calculation (min): 40 min Charges:  OT General Charges $OT Visit: 1 Visit OT Evaluation $OT Eval Moderate Complexity: [place], OT/L   Acute OT Clinical Specialist Acute Rehabilitation Services Pager [phone] Office [phone]   [facility] 01/13/2021, 11:11 AM

## 2021-01-13 NOTE — TOC Initial Note (Signed)
Transition of Care [facility]) - Initial/Assessment Note    Patient Details  Name: Kallista Lacy MRN: [id number] Date of Birth: 1965/08/16  Transition of Care [facility]) CM/SW Contact:    Pollie Friar, RN Phone Number: 01/13/2021, 4:15 PM  Clinical Narrative:                 Patient is from home with her daughter. She states that when her daughter she lives with goes to work her other daughter stays with her.  She denies issues with transportation or home medications. Recommendations for [facility] services. CM provided her choice under [email or web address] She had no preference. HH arranged through [place]. Information on the AVS. TOC following for further d/c needs.   Expected Discharge Plan: [place] Barriers to Discharge: Continued Medical Work up   Patient Goals and CMS Choice   CMS Medicare.gov Compare Post Acute Care list provided to:: Patient Choice offered to / list presented to : Patient  Expected Discharge Plan and Services Expected Discharge Plan: [place]   Discharge Planning Services: CM Consult Post Acute Care Choice: [place] arrangements for the past 2 months: Apartment                           HH Arranged: PT, OT HH Agency: [place] Date [place]: 01/13/21   Representative spoke with at [place]: Amy  Prior Living Arrangements/Services Living arrangements for the past 2 months: Apartment Lives with:: Adult Children Patient language and need for interpreter reviewed:: Yes Do you feel safe going back to the place where you live?: Yes      Need for Family Participation in Patient Care: Yes (Comment) Care giver support system in place?: Yes (comment) Current home services: DME (wheelchair/ walker) Criminal Activity/Legal Involvement Pertinent to Current Situation/Hospitalization: No - Comment as needed  Activities of Daily Living      Permission Sought/Granted                  Emotional  Assessment   Attitude/Demeanor/Rapport: Engaged Affect (typically observed): Accepting Orientation: : Oriented to Self, Oriented to Place, Oriented to  Time, Oriented to Situation   Psych Involvement: No (comment)  Admission diagnosis:  Altered mental status, unspecified altered mental status type [R41.82] Altered mental status, unspecified [R41.82] Patient Active Problem List   Diagnosis Date Noted   Altered mental status, unspecified 01/12/2021   Other chronic postprocedural pain 01/09/2021   Past heart attack 11/13/2020   Spastic hemiplegia due to cerebrovascular disease ([place]) 11/03/2020   Debility 10/18/2020   ESRD on hemodialysis (HCC)    SOB (shortness of breath) 09/27/2020   Volume overload 09/26/2020   CKD (chronic kidney disease) stage 5, GFR less than 15 ml/min ([place]) 09/13/2020   H/O: CVA (cerebrovascular accident) 09/12/2020   Acute blood loss anemia    Chronic kidney disease (CKD), stage IV (severe) ([place])    Uncontrolled type 2 diabetes mellitus with hyperglycemia ([place])    Essential hypertension    Right pontine cerebrovascular accident ([place]) 06/12/2020   Stroke-like symptoms 06/09/2020   Acute kidney injury superimposed on CKD ([place]) 06/09/2020   Cerebral thrombosis with cerebral infarction 06/09/2020   Hypertension    Hyperlipidemia    Diabetes type 2, controlled ([place])    Bimalleolar ankle fracture 11/24/2018   Lower abdominal pain 07/28/2017   Bladder mass 07/18/2017   Painless hematuria 07/18/2017   Intractable vomiting with nausea  07/12/2017   Hemiparesis affecting right side as late effect of cerebrovascular accident (CVA) ([place]) 09/22/2016   AKI (acute kidney injury) ([place]) 01/08/2016   CAD (coronary artery disease) 01/07/2016   Multiple sclerosis ([place]) 2009   PCP:  Vernie Shanks, MD Pharmacy:   [facility] [address] [place] - [address] [place] [place] [address] Phone: [phone] Fax:  [phone]     Social Determinants of Health (SDOH) Interventions    Readmission Risk Interventions No flowsheet data found.

## 2021-01-13 NOTE — Evaluation (Signed)
Physical Therapy Evaluation Patient Details Name: Hannah [name] MRN: [id number] DOB: [birth year] Today's Date: 01/13/2021   History of Present Illness  55 y.o. female presenting with altered mental status, N/V  and worsening dysarthria. MRI (-) for acute CVA. Covid +. Past medical history: end-stage renal disease on hemodialysis, hypertension, hyperlipidemia, diabetes mellitus type 2, unspecified multiple CVA with residual right-sided hemiparesis and some speech difficulties,  multiple sclerosis, chronic right upper extremity pain, recently placed on methadone.  Clinical Impression  Pt admitted with above diagnosis. PTA pt's daughters assist her nearly 24/7. Prior to having covid she was ambulatory for short distances in her home with RW. Since covid she has been only transferring to her w/c due to generalized weakness. Pt able to ambulate 4' fwd and back with BUE support and min A today. She is at a level that daughter could assist her with transferring to w/c. Recommend HHPT to return to previous mobility.  Pt currently with functional limitations due to the deficits listed below (see PT Problem List). Pt will benefit from skilled PT to increase their independence and safety with mobility to allow discharge to the venue listed below.       Follow Up Recommendations Home health PT;Supervision for mobility/OOB    Equipment Recommendations  None recommended by PT    Recommendations for Other Services       Precautions / Restrictions Precautions Precautions: Fall Precaution Comments: Painful RUE; painful AV fistula site Restrictions Weight Bearing Restrictions: No      Mobility  Bed Mobility Overal bed mobility: Needs Assistance Bed Mobility: Supine to Sit     Supine to sit: Min guard     General bed mobility comments: increased time and effort as well as vc's to come to EOB. No use of RUE    Transfers Overall transfer level: Needs assistance   Transfers: Sit to/from  Stand;Stand Pivot Transfers Sit to Stand: Min assist;+2 safety/equipment Stand pivot transfers: Min assist       General transfer comment: pt limited by painful RUE and c/o discomfort at IV site L wrist so did not want to hold RW on L. B forearm support given  Ambulation/Gait Ambulation/Gait assistance: Min assist;+2 physical assistance Gait Distance (Feet): 8 Feet (4' fwd, 4' bkwd) Assistive device: 2 person hand held assist Gait Pattern/deviations: Decreased weight shift to right;Decreased dorsiflexion - right;Decreased stride length Gait velocity: decreased Gait velocity interpretation: <1.31 ft/sec, indicative of household ambulator General Gait Details: R hip hike due to foot drop, heavily reliant on UE support  Stairs            Wheelchair Mobility    Modified Rankin (Stroke Patients Only)       Balance Overall balance assessment: Needs assistance Sitting-balance support: Single extremity supported;Feet supported Sitting balance-Leahy Scale: Fair     Standing balance support: Bilateral upper extremity supported Standing balance-Leahy Scale: Poor                               Pertinent Vitals/Pain Pain Assessment: Faces Faces Pain Scale: Hurts even more Pain Location: RUE Pain Descriptors / Indicators: Aching;Discomfort;Grimacing Pain Intervention(s): Limited activity within patient's tolerance;Monitored during session    Home Living Family/patient expects to be discharged to:: Private residence Living Arrangements: Children Available Help at Discharge: Family;Available 24 hours/day Type of Home: Apartment Home Access: Stairs to enter   Entrance Stairs-Number of Steps: 1 step up on curb Home Layout: One level Home Equipment:  Walker - 4 wheels;Cane - single point;Wheelchair - manual;Tub bench Additional Comments: pt has been trying to get apt complex to build a ramp for the one step (2x). Currently her daughter pulls her up bkwd in w/c. She  reports her daughter is planning to move into an accessible house in a year or too and she will go there    Prior Function Level of Independence: Needs assistance   Gait / Transfers Assistance Needed: was walking with RW before Covid; since Covid has mostly been using wc level mobility with assistance of daughters  ADL's / Homemaking Assistance Needed: bathes/dresses herself; daughter assists as needed  Comments: Has AFO at home but does not use it     Hand Dominance   Dominant Hand: Left    Extremity/Trunk Assessment   Upper Extremity Assessment Upper Extremity Assessment: Defer to OT evaluation RUE Deficits / Details: hemiparetic with swelling noted around fistula site RUE Sensation:  (abnormal forearm since fistual placement) RUE Coordination: decreased fine motor;decreased gross motor    Lower Extremity Assessment Lower Extremity Assessment: Generalized weakness (R>L)    Cervical / Trunk Assessment Cervical / Trunk Assessment: Other exceptions (forward head; R bias)  Communication   Communication: Expressive difficulties  Cognition Arousal/Alertness: Awake/alert Behavior During Therapy: WFL for tasks assessed/performed Overall Cognitive Status: Within Functional Limits for tasks assessed                                 General Comments: most likely close to baseline; able to give accurate health information and good problem solving. Scored a 0/29 on the Short Blessed Test, indicating cognition WNL.      General Comments General comments (skin integrity, edema, etc.): VSS. Discussed HHPT/OT to address weakness pt has experienced since covid dx    Exercises General Exercises - Lower Extremity Ankle Circles/Pumps: AROM;Both;15 reps;Seated Other Exercises Other Exercises: lap slides Other Exercises: scap retraction Other Exercises: squeeze ball and theraputty for grip and pinch strengthening   Assessment/Plan    PT Assessment Patient needs continued  PT services  PT Problem List Decreased strength;Decreased range of motion;Decreased activity tolerance;Decreased balance;Decreased mobility;Decreased coordination;Pain       PT Treatment Interventions DME instruction;Gait training;Functional mobility training;Therapeutic activities;Therapeutic exercise;Balance training;Patient/family education    PT Goals (Current goals can be found in the Care Plan section)  Acute Rehab PT Goals Patient Stated Goal: to go home PT Goal Formulation: With patient Time For Goal Achievement: 01/27/21 Potential to Achieve Goals: Good    Frequency Min 3X/week   Barriers to discharge        Co-evaluation PT/OT/SLP Co-Evaluation/Treatment: Yes Reason for Co-Treatment: Necessary to address cognition/behavior during functional activity;For patient/therapist safety;Complexity of the patient's impairments (multi-system involvement) PT goals addressed during session: Mobility/safety with mobility;Balance;Proper use of DME OT goals addressed during session: ADL's and self-care       AM-PAC PT "6 Clicks" Mobility  Outcome Measure Help needed turning from your back to your side while in a flat bed without using bedrails?: A Little Help needed moving from lying on your back to sitting on the side of a flat bed without using bedrails?: A Little Help needed moving to and from a bed to a chair (including a wheelchair)?: A Little Help needed standing up from a chair using your arms (e.g., wheelchair or bedside chair)?: A Little Help needed to walk in hospital room?: A Lot Help needed climbing 3-5 steps with a railing? :  Total 6 Click Score: 15    End of Session Equipment Utilized During Treatment: Gait belt Activity Tolerance: Patient tolerated treatment well Patient left: in chair;with call bell/phone within reach;with chair alarm set Nurse Communication: Mobility status PT Visit Diagnosis: Unsteadiness on feet (R26.81);Muscle weakness (generalized)  (M62.81);Difficulty in walking, not elsewhere classified (R26.2);Pain Pain - Right/Left: Right Pain - part of body: Arm    Time: [id number] PT Time Calculation (min) (ACUTE ONLY): 33 min   Charges:   PT Evaluation $PT Eval Moderate Complexity: [place], PT  Acute Rehab Services  Pager [phone] Office [place] 01/13/2021, 1:06 PM

## 2021-01-13 NOTE — Progress Notes (Signed)
  Echocardiogram 2D Echocardiogram has been performed.  Hannah [name] 01/13/2021, 9:34 AM

## 2021-01-13 NOTE — Care Management Obs Status (Signed)
[place] NOTIFICATION   Patient Details  Name: Hannah [name] MRN: [id number] Date of Birth: Jan 08, 1966   Medicare Observation Status Notification Given:  Yes    Pollie Friar, RN 01/13/2021, 4:04 PM

## 2021-01-13 NOTE — Consult Note (Signed)
Referring Provider: No ref. provider found Primary Care Physician:  Vernie Shanks, MD Primary Nephrologist:  Dr. Moshe Cipro  Reason for Consultation: Medical management of end-stage renal disease, maintenance of euvolemia, evaluation and treatment of anemia, evaluate and treatment all secondary hyperparathyroidism  HPI: This is a 55 year old lady with a significant history of end-stage renal disease she started dialysis in June 2022 and dialyzes at [facility] center on a Monday Wednesday Friday schedule.  She appears to be doing well with dialysis and is compliant with her treatments.  She has a history of hypertension type 2 diabetes and multiple CVAs with residual right-sided hemiparesis and some speech difficulties.  She presented to the emergency room 01/12/2021 with altered mental status and code stroke initiated.  CT's findings of head were negative for intracranial bleed.  Patient refused TNKase.  CT angio of head showed no hemodynamic significant stenosis.  Blood pressure 175/77 pulse 91 temperature 99 O2 sats 99% room air.  Sodium 133 potassium 3.5 chloride 97 CO2 29 BUN 7 creatinine 2.39 glucose 194.  Calcium 9 liver enzymes within normal range LDL cholesterol 121.  Hemoglobin 8.7.  Home medications: Amlodipine, PhosLo with meals, Coreg 25 mg twice daily Plavix 75 mg daily dantrolene 100 mg twice daily, Pepcid 20 mg daily Neurontin 300 mg daily hydralazine 50 mg every 8 hours, insulin, methadone, oxycodone.  Past Medical History:  Diagnosis Date   Cerebral infarction ([place]) 07/12/2017   2009 first one, has had a total of 4 stokes   Chronic kidney disease    tues thurs sat - dialysis   Diabetes type 2, controlled ([place])    Hemiparesis affecting right side as late effect of cerebrovascular accident (CVA) ([place])    Hyperlipidemia    Hypertension    Multiple sclerosis ([place]) 2009   Stroke [facility])    Walker as ambulation aid     Past Surgical History:  Procedure  Laterality Date   APPENDECTOMY     AV FISTULA PLACEMENT Right 10/02/2020   Procedure: RIGHT ARTERIOVENOUS (AV) FISTULA CREATION;  Surgeon: Waynetta Sandy, MD;  Location: [place];  Service: Vascular;  Laterality: Right;   [place] Right 11/24/2020   Procedure: SECOND STAGE RIGHT ARM [place];  Surgeon: Marty Heck, MD;  Location: [place];  Service: Vascular;  Laterality: Right;   CYSTOSTOMY W/ BLADDER BIOPSY     IR FLUORO GUIDE CV LINE RIGHT  09/29/2020   IR US GUIDE VASC ACCESS RIGHT  09/29/2020   OVARIAN CYST SURGERY     stent placed in heart      Prior to Admission medications   Medication Sig Start Date End Date Taking? Authorizing Provider  amLODipine (NORVASC) 10 MG tablet Take 1 tablet (10 mg total) by mouth daily. 11/03/20  Yes Love, Ivan Anchors, PA-C  calcium acetate (PHOSLO) 667 MG capsule Take 667 mg by mouth See admin instructions. Take one capsule (667 mg) by mouth  up to 3 times daily - with each meal and snacks 11/11/20  Yes [provider]  carvedilol (COREG) 25 MG tablet Take 1 tablet (25 mg total) by mouth 2 (two) times daily with a meal. 11/03/20  Yes Love, Ivan Anchors, PA-C  clopidogrel (PLAVIX) 75 MG tablet Take 1 tablet (75 mg total) by mouth daily. 11/03/20  Yes Love, Ivan Anchors, PA-C  dantrolene (DANTRIUM) 100 MG capsule Take 1 capsule (100 mg total) by mouth 2 (two) times daily. 01/09/21  Yes Lovorn, Jinny Blossom, MD  diclofenac Sodium (  VOLTAREN) 1 % GEL Apply 2 g topically 4 (four) times daily. To leg Patient taking differently: Apply 2 g topically 4 (four) times daily as needed (pain). To leg 11/03/20  Yes Love, Ivan Anchors, PA-C  famotidine (PEPCID) 20 MG tablet Take 1 tablet (20 mg total) by mouth daily. 11/03/20  Yes Love, Ivan Anchors, PA-C  gabapentin (NEURONTIN) 300 MG capsule Take 1 capsule (300 mg total) by mouth at bedtime. 12/16/20  Yes Marty Heck, MD  hydrALAZINE (APRESOLINE) 50 MG tablet Take 1 tablet (50 mg total) by  mouth every 8 (eight) hours. Patient taking differently: Take 50 mg by mouth See admin instructions. Take one tablet (50 mg) by mouth every 8 hours on Sunday, Tuesday, Thursday, Saturday (non-dialysis days), take one tablet (50 mg) twice daily on Monday, Wednesday, Friday (dialysis days) - after dialysis and at bedtime 11/03/20  Yes Love, Ivan Anchors, PA-C  hydrOXYzine (ATARAX/VISTARIL) 10 MG tablet Take 1 tablet (10 mg total) by mouth 3 (three) times daily as needed for up to 7 days for itching. Patient taking differently: Take 10 mg by mouth 3 (three) times daily as needed for itching (take with each percocet dose). 01/11/21 01/18/21 Yes Horton, Alvin Critchley, DO  insulin degludec (TRESIBA FLEXTOUCH) 100 UNIT/ML FlexTouch Pen Inject 12 Units into the skin at bedtime. Patient taking differently: Inject 8-12 Units into the skin at bedtime. Sliding scale 11/03/20  Yes Love, Ivan Anchors, PA-C  insulin lispro (HUMALOG) 100 UNIT/ML KwikPen Inject 5 Units into the skin 3 (three) times daily. Patient taking differently: Inject 4-6 Units into the skin See admin instructions. Inject 4-6 units subcutaneously up to three times daily before meals 11/04/20  Yes Love, Ivan Anchors, PA-C  melatonin 5 MG TABS Take 5 mg by mouth at bedtime.   Yes [provider]  nitroGLYCERIN (NITROSTAT) 0.4 MG SL tablet Place 1 tablet (0.4 mg total) under the tongue every 5 (five) minutes as needed for chest pain. 06/25/20  Yes Angiulli, Lavon Paganini, PA-C  ondansetron (ZOFRAN) 4 MG tablet Take 4-8 mg by mouth 3 (three) times daily as needed for nausea or vomiting. 01/10/21  Yes [provider]  polyethylene glycol (MIRALAX / GLYCOLAX) 17 g packet Take 17 g by mouth daily. Patient taking differently: Take 17 g by mouth daily as needed for moderate constipation. 11/04/20  Yes Love, Ivan Anchors, PA-C  senna-docusate (SENOKOT-S) 8.6-50 MG tablet Take 1 tablet by mouth 2 (two) times daily as needed for moderate constipation. 11/03/20  Yes Love, Ivan Anchors, PA-C  tiZANidine (ZANAFLEX) 2 MG tablet Take 1 tablet (2 mg total) by mouth at bedtime. Patient taking differently: Take 2 mg by mouth 3 (three) times daily. 11/03/20  Yes Love, Ivan Anchors, PA-C  Continuous Blood Gluc Sensor (FREESTYLE LIBRE 2 SENSOR) MISC USE AS DIRECTED CHANGE EVERY 14 DAYS 12/30/20   [provider]  methadone (DOLOPHINE) 5 MG tablet Take 1 tablet (5 mg total) by mouth every 12 (twelve) hours. Patient not taking: Reported on 01/12/2021 01/09/21   Courtney Heys, MD  oxyCODONE-acetaminophen (PERCOCET/ROXICET) 5-325 MG tablet Take 1 tablet by mouth every 6 (six) hours as needed for up to 3 days for severe pain (Patient taking differently, states that she is taking only 1/2 tablet). Patient taking differently: Take 1 tablet by mouth every 6 (six) hours as needed for severe pain. Take with hydroxyzine for the itching 01/11/21 01/14/21  Horton, Alvin Critchley, DO    Current Facility-Administered Medications  Medication Dose Route Frequency  Provider Last Rate Last Admin   acetaminophen (TYLENOL) tablet 650 mg  650 mg Oral Q4H PRN Aline August, MD       Or   acetaminophen (TYLENOL) 160 MG/5ML solution 650 mg  650 mg Per Tube Q4H PRN Aline August, MD       Or   acetaminophen (TYLENOL) suppository 650 mg  650 mg Rectal Q4H PRN Aline August, MD       aspirin suppository 300 mg  300 mg Rectal Daily Starla Link, Kshitiz, MD       Or   aspirin tablet 325 mg  325 mg Oral Daily Starla Link, Kshitiz, MD   325 mg at 01/12/21 1834   atorvastatin (LIPITOR) tablet 80 mg  80 mg Oral QHS Alekh, Kshitiz, MD       calcium acetate (PHOSLO) capsule 667 mg  667 mg Oral TID WC Alekh, Kshitiz, MD       Chlorhexidine Gluconate Cloth 2 % PADS 6 each  6 each Topical Daily Alekh, Kshitiz, MD       clopidogrel (PLAVIX) tablet 75 mg  75 mg Oral Daily Alekh, Kshitiz, MD       heparin injection 5,000 Units  5,000 Units Subcutaneous Q8H Alekh, Kshitiz, MD   5,000 Units at 01/13/21 [id number]   insulin aspart (novoLOG)  injection 0-5 Units  0-5 Units Subcutaneous QHS Alekh, Kshitiz, MD       insulin aspart (novoLOG) injection 0-6 Units  0-6 Units Subcutaneous TID WC Alekh, Kshitiz, MD   1 Units at 01/13/21 0626   insulin glargine-yfgn (SEMGLEE) injection 5 Units  5 Units Subcutaneous QHS Alekh, Kshitiz, MD   5 Units at 01/13/21 0032   nitroGLYCERIN (NITROSTAT) SL tablet 0.4 mg  0.4 mg Sublingual Q5 Min x 3 PRN Aline August, MD       senna-docusate (Senokot-S) tablet 1 tablet  1 tablet Oral QHS PRN Aline August, MD        Allergies as of 01/12/2021 - Review Complete 01/12/2021  Allergen Reaction Noted   Ibuprofen Swelling 12/01/2015   Influenza vaccines Anaphylaxis and Swelling 07/10/2017   Pneumococcal vaccine Anaphylaxis and Swelling 07/10/2017   Promethazine Other (See Comments) 09/22/2016   Ambien [zolpidem] Other (See Comments) 09/11/2020   Cyclobenzaprine Other (See Comments) 12/01/2015   Tape Itching 09/12/2020   Tramadol hcl Other (See Comments) 09/11/2020   Oxycodone Nausea And Vomiting 12/01/2015    No family history on file.  Social History   Socioeconomic History   Marital status: Divorced    Spouse name: Not on file   Number of children: Not on file   Years of education: Not on file   Highest education level: Not on file  Occupational History   Occupation: disabled  Tobacco Use   Smoking status: Never   Smokeless tobacco: Never  Vaping Use   Vaping Use: Never used  Substance and Sexual Activity   Alcohol use: Not Currently   Drug use: Never   Sexual activity: Not on file  Other Topics Concern   Not on file  Social History Narrative   Not on file   Social Determinants of Health   Financial Resource Strain: Not on file  Food Insecurity: No Food Insecurity   Worried About Running Out of Food in the Last Year: Never true   [place] in the Last Year: Never true  Transportation Needs: No Transportation Needs   Lack of Transportation (Medical): No   Lack of  Transportation (Non-Medical): No  Physical Activity:  Not on file  Stress: Not on file  Social Connections: Not on file  Intimate Partner Violence: Not on file    Review of Systems: Gen: History of weakness and fatigue HEENT: No visual complaints, No history of Retinopathy. Normal external appearance No Epistaxis or Sore throat. No sinusitis.   CV: Denies chest pain, angina, palpitations, syncope, orthopnea, PND, peripheral edema, and claudication. Resp: Denies dyspnea at rest, dyspnea with exercise, cough, sputum, wheezing, coughing up blood, and pleurisy. GI: Denies vomiting blood, jaundice, and fecal incontinence.   Denies dysphagia or odynophagia. GU : History of end-stage renal disease MS: Denies joint pain, limitation of movement, and swelling, stiffness, low back pain, extremity pain. Denies muscle weakness, cramps, atrophy.  No use of non steroidal antiinflammatory drugs. Derm: Denies rash, itching, dry skin, hives, moles, warts, or unhealing ulcers.  Psych: Denies depression, anxiety, memory loss, suicidal ideation, hallucinations, paranoia, and confusion. Heme: Denies bruising, bleeding, and enlarged lymph nodes. Neuro: No headache.  No diplopia. No dysarthria.  No dysphasia.  History of TIAs, history of multiple sclerosis no Seizures. No paresthesias.  No weakness. Endocrine history of diabetes  Physical Exam: Vital signs in last 24 hours: Temp:  [98.5 F (36.9 C)-98.7 F (37.1 C)] 98.7 F (37.1 C) (08/30 0232) Pulse Rate:  [85-95] 85 (08/30 0232) Resp:  [10-18] 16 (08/30 0232) BP: (138-206)/(64-89) 138/72 (08/30 0232) SpO2:  [96 %-100 %] 100 % (08/30 0232) Weight:  [52.2 kg] 52.2 kg (08/29 1406)   General:   Alert nondistressed Head:  Normocephalic and atraumatic. Eyes:  Sclera clear, no icterus.   Conjunctiva pink. Ears:  Normal auditory acuity. Nose:  No deformity, discharge,  or lesions. Mouth:  No deformity or lesions, dentition normal. Neck:  Supple; no masses  or thyromegaly. JVP not elevated Lungs:  Clear throughout to auscultation.   No wheezes, crackles, or rhonchi. No acute distress. Heart:  Regular rate and rhythm; no murmurs, clicks, rubs,  or gallops. Abdomen:  Soft, nontender and nondistended. No masses, hepatosplenomegaly or hernias noted. Normal bowel sounds, without guarding, and without rebound.   Msk:  Symmetrical without gross deformities. Normal posture. Pulses:  No carotid, renal, femoral bruits. DP and PT symmetrical and equal Extremities:  Without clubbing or edema. Neurologic: Upper and lower extremity weakness Skin:  Intact without significant lesions or rashes. Cervical Nodes:  No significant cervical adenopathy. Psych:  Alert and cooperative. Normal mood and affect.  Intake/Output from previous day: No intake/output data recorded. Intake/Output this shift: No intake/output data recorded.  Lab Results: Recent Labs    01/11/21 0926 01/12/21 1405 01/12/21 1419 01/13/21 0453  WBC 5.1 4.7  --  4.6  HGB 10.1* 9.7* 10.2* 8.7*  HCT 32.4* 30.1* 30.0* 26.5*  PLT 198 177  --  157   BMET Recent Labs    01/11/21 0926 01/12/21 1405 01/12/21 1419 01/13/21 0453  NA 132* 141 138 133*  K 4.0 3.5 3.8 3.5  CL 91* 102 96* 97*  CO2 28 31  --  29  GLUCOSE 231* 183* 189* 128*  BUN 13 <5* <3* 7  CREATININE 2.67* 1.28* 1.10* 2.39*  CALCIUM 9.6 9.2  --  9.0   LFT Recent Labs    01/13/21 0453  PROT 6.2*  ALBUMIN 3.0*  AST 13*  ALT 8  ALKPHOS 46  BILITOT 0.5   PT/INR Recent Labs    01/12/21 1405  LABPROT 13.2  INR 1.0   Hepatitis Panel No results for input(s): HEPBSAG, HCVAB, HEPAIGM, HEPBIGM in  the last 72 hours.  Studies/Results: CT Angio Head W or Wo Contrast  Result Date: 01/12/2021 CLINICAL DATA:  Follow up code stroke, unable to move without assistance EXAM: CT ANGIOGRAPHY HEAD AND NECK CT PERFUSION BRAIN TECHNIQUE: Multidetector CT imaging of the head and neck was performed using the standard protocol  during bolus administration of intravenous contrast. Multiplanar CT image reconstructions and MIPs were obtained to evaluate the vascular anatomy. Carotid stenosis measurements (when applicable) are obtained utilizing NASCET criteria, using the distal internal carotid diameter as the denominator. Multiphase CT imaging of the brain was performed following IV bolus contrast injection. Subsequent parametric perfusion maps were calculated using RAPID software. CONTRAST:  189m OMNIPAQUE IOHEXOL 350 MG/ML SOLN COMPARISON:  Same-day noncontrast CT head FINDINGS: CTA NECK FINDINGS Aortic arch: Standard branching. Imaged portion shows no evidence of aneurysm or dissection. No significant stenosis of the major arch vessel origins. Right carotid system: There is mild calcified atherosclerotic plaque at the right carotid bulb resulting in approximately 40-50% stenosis. The distal right internal carotid artery is widely patent. There is no evidence of dissection or aneurysm. Left carotid system: No evidence of dissection, stenosis (50% or greater) or occlusion. Vertebral arteries: The bilateral vertebral arteries are otherwise patent, with no evidence of dissection, hemodynamically significant stenosis, or occlusion. Skeleton: There is mild degenerative change of the cervical spine, most advanced at C4-C5. Other neck: The thyroid is diffusely enlarged and heterogeneous with a single nodule measuring up to 1.4 cm. A right-sided dialysis catheter is noted. The tip is not evaluated. There is a mucous retention cyst in the left sphenoid sinus. There are numerous dental caries and periapical lucencies around the roots of multiple teeth, most notably the posterior most left maxillary molar. Upper chest: The lung apices are clear. Review of the MIP images confirms the above findings CTA HEAD FINDINGS Anterior circulation: There is calcification of the bilateral cavernous ICAs without hemodynamically significant stenosis or occlusion.  The bilateral MCAs and ACAs are patent. There is no aneurysm. Posterior circulation: The V4 segments of the vertebral arteries are patent. The basilar artery is patent. The bilateral PCAs are patent. There is no aneurysm. Venous sinuses: As permitted by contrast timing, patent. Anatomic variants: None. Review of the MIP images confirms the above findings CT Brain Perfusion Findings: ASPECTS: 10 CBF (<30%) Volume: 0651mPerfusion (Tmax>6.0s) volume: 51m71mismatch Volume: n/a Infarction Location:n/a IMPRESSION: 1. No large vessel occlusion or dissection identified. 2. No infarct core or penumbra identified. 3. Approximately 50% stenosis of the proximal right internal carotid artery and mild calcification of the bilateral cavernous ICAs without hemodynamically significant stenosis. Otherwise, patent vasculature of the head and neck. 4. Enlarged and heterogeneous thyroid with a 1.4 cm nodule in the left. No specific imaging follow-up is required for this nodule. 5. Extensive dental disease as detailed above. Electronically Signed   By: PetValetta MoleD.   On: 01/12/2021 16:13   DG Chest 1 View  Result Date: 01/11/2021 CLINICAL DATA:  Weakness. EXAM: CHEST  1 VIEW COMPARISON:  December 18, 2020. FINDINGS: The heart size and mediastinal contours are within normal limits. Both lungs are clear. Right internal jugular dialysis catheter is unchanged. The visualized skeletal structures are unremarkable. IMPRESSION: No active disease. Electronically Signed   By: JamMarijo ConceptionD.   On: 01/11/2021 10:58   CT HEAD WO CONTRAST (5MM)  Result Date: 01/11/2021 CLINICAL DATA:  Dizziness, non-specific EXAM: CT HEAD WITHOUT CONTRAST TECHNIQUE: Contiguous axial images were obtained from the base  of the skull through the vertex without intravenous contrast. COMPARISON:  Oct 03, 2020, June 09, 2020 FINDINGS: Brain: No evidence of acute infarction, hemorrhage, hydrocephalus, extra-axial collection or mass lesion/mass effect. Again  noted are multiple areas of hypodensity predominantly involving the LEFT greater than RIGHT periventricular and subcortical white matter. This likely reflects the sequela of chronic microvascular ischemic disease superimposed on reported history of multiple sclerosis. There is a focal area of cortical hypodensity in the LEFT parietal lobe which is unchanged since June 09, 2020 MRI and likely reflects sequela of prior infarction (series 2, image 23). Remote bilateral basal ganglia lacunar infarction. Hypodensity of the pons, unchanged. Vascular: Mild vascular calcifications of the carotid siphons. Skull: Normal. Negative for fracture or focal lesion. Sinuses/Orbits: No acute finding. Other: None. IMPRESSION: No acute intracranial abnormality. If concern for acute stroke or active demyelinating lesion, recommend dedicated brain MRI with and without contrast. Electronically Signed   By: Valentino Saxon M.D.   On: 01/11/2021 10:19   CT Angio Neck W and/or Wo Contrast  Result Date: 01/12/2021 CLINICAL DATA:  Follow up code stroke, unable to move without assistance EXAM: CT ANGIOGRAPHY HEAD AND NECK CT PERFUSION BRAIN TECHNIQUE: Multidetector CT imaging of the head and neck was performed using the standard protocol during bolus administration of intravenous contrast. Multiplanar CT image reconstructions and MIPs were obtained to evaluate the vascular anatomy. Carotid stenosis measurements (when applicable) are obtained utilizing NASCET criteria, using the distal internal carotid diameter as the denominator. Multiphase CT imaging of the brain was performed following IV bolus contrast injection. Subsequent parametric perfusion maps were calculated using RAPID software. CONTRAST:  143m OMNIPAQUE IOHEXOL 350 MG/ML SOLN COMPARISON:  Same-day noncontrast CT head FINDINGS: CTA NECK FINDINGS Aortic arch: Standard branching. Imaged portion shows no evidence of aneurysm or dissection. No significant stenosis of the major  arch vessel origins. Right carotid system: There is mild calcified atherosclerotic plaque at the right carotid bulb resulting in approximately 40-50% stenosis. The distal right internal carotid artery is widely patent. There is no evidence of dissection or aneurysm. Left carotid system: No evidence of dissection, stenosis (50% or greater) or occlusion. Vertebral arteries: The bilateral vertebral arteries are otherwise patent, with no evidence of dissection, hemodynamically significant stenosis, or occlusion. Skeleton: There is mild degenerative change of the cervical spine, most advanced at C4-C5. Other neck: The thyroid is diffusely enlarged and heterogeneous with a single nodule measuring up to 1.4 cm. A right-sided dialysis catheter is noted. The tip is not evaluated. There is a mucous retention cyst in the left sphenoid sinus. There are numerous dental caries and periapical lucencies around the roots of multiple teeth, most notably the posterior most left maxillary molar. Upper chest: The lung apices are clear. Review of the MIP images confirms the above findings CTA HEAD FINDINGS Anterior circulation: There is calcification of the bilateral cavernous ICAs without hemodynamically significant stenosis or occlusion. The bilateral MCAs and ACAs are patent. There is no aneurysm. Posterior circulation: The V4 segments of the vertebral arteries are patent. The basilar artery is patent. The bilateral PCAs are patent. There is no aneurysm. Venous sinuses: As permitted by contrast timing, patent. Anatomic variants: None. Review of the MIP images confirms the above findings CT Brain Perfusion Findings: ASPECTS: 10 CBF (<30%) Volume: 041mPerfusion (Tmax>6.0s) volume: 36m47mismatch Volume: n/a Infarction Location:n/a IMPRESSION: 1. No large vessel occlusion or dissection identified. 2. No infarct core or penumbra identified. 3. Approximately 50% stenosis of the proximal right internal carotid  artery and mild calcification of  the bilateral cavernous ICAs without hemodynamically significant stenosis. Otherwise, patent vasculature of the head and neck. 4. Enlarged and heterogeneous thyroid with a 1.4 cm nodule in the left. No specific imaging follow-up is required for this nodule. 5. Extensive dental disease as detailed above. Electronically Signed   By: Valetta Mole M.D.   On: 01/12/2021 16:13   MR BRAIN WO CONTRAST  Result Date: 01/13/2021 CLINICAL DATA:  TIA EXAM: MRI HEAD WITHOUT CONTRAST TECHNIQUE: Multiplanar, multiecho pulse sequences of the brain and surrounding structures were obtained without intravenous contrast. COMPARISON:  None. FINDINGS: Brain: No acute infarct, mass effect or extra-axial collection. Numerous foci of chronic microhemorrhage in a predominantly central distribution. Hyperintense T2-weighted signal is moderately widespread throughout the white matter. Generalized volume loss without a clear lobar predilection. The midline structures are normal. Vascular: Major flow voids are preserved. Skull and upper cervical spine: Normal calvarium and skull base. Visualized upper cervical spine and soft tissues are normal. Sinuses/Orbits:No paranasal sinus fluid levels or advanced mucosal thickening. No mastoid or middle ear effusion. Normal orbits. IMPRESSION: 1. No acute intracranial abnormality. 2. Numerous foci of chronic microhemorrhage in a predominantly central distribution, most consistent with chronic hypertensive angiopathy. Electronically Signed   By: Ulyses Jarred M.D.   On: 01/13/2021 00:22   CT CEREBRAL PERFUSION W CONTRAST  Result Date: 01/12/2021 CLINICAL DATA:  Follow up code stroke, unable to move without assistance EXAM: CT ANGIOGRAPHY HEAD AND NECK CT PERFUSION BRAIN TECHNIQUE: Multidetector CT imaging of the head and neck was performed using the standard protocol during bolus administration of intravenous contrast. Multiplanar CT image reconstructions and MIPs were obtained to evaluate the  vascular anatomy. Carotid stenosis measurements (when applicable) are obtained utilizing NASCET criteria, using the distal internal carotid diameter as the denominator. Multiphase CT imaging of the brain was performed following IV bolus contrast injection. Subsequent parametric perfusion maps were calculated using RAPID software. CONTRAST:  162m OMNIPAQUE IOHEXOL 350 MG/ML SOLN COMPARISON:  Same-day noncontrast CT head FINDINGS: CTA NECK FINDINGS Aortic arch: Standard branching. Imaged portion shows no evidence of aneurysm or dissection. No significant stenosis of the major arch vessel origins. Right carotid system: There is mild calcified atherosclerotic plaque at the right carotid bulb resulting in approximately 40-50% stenosis. The distal right internal carotid artery is widely patent. There is no evidence of dissection or aneurysm. Left carotid system: No evidence of dissection, stenosis (50% or greater) or occlusion. Vertebral arteries: The bilateral vertebral arteries are otherwise patent, with no evidence of dissection, hemodynamically significant stenosis, or occlusion. Skeleton: There is mild degenerative change of the cervical spine, most advanced at C4-C5. Other neck: The thyroid is diffusely enlarged and heterogeneous with a single nodule measuring up to 1.4 cm. A right-sided dialysis catheter is noted. The tip is not evaluated. There is a mucous retention cyst in the left sphenoid sinus. There are numerous dental caries and periapical lucencies around the roots of multiple teeth, most notably the posterior most left maxillary molar. Upper chest: The lung apices are clear. Review of the MIP images confirms the above findings CTA HEAD FINDINGS Anterior circulation: There is calcification of the bilateral cavernous ICAs without hemodynamically significant stenosis or occlusion. The bilateral MCAs and ACAs are patent. There is no aneurysm. Posterior circulation: The V4 segments of the vertebral arteries  are patent. The basilar artery is patent. The bilateral PCAs are patent. There is no aneurysm. Venous sinuses: As permitted by contrast timing, patent. Anatomic variants: None.  Review of the MIP images confirms the above findings CT Brain Perfusion Findings: ASPECTS: 10 CBF (<30%) Volume: 9m Perfusion (Tmax>6.0s) volume: 075mMismatch Volume: n/a Infarction Location:n/a IMPRESSION: 1. No large vessel occlusion or dissection identified. 2. No infarct core or penumbra identified. 3. Approximately 50% stenosis of the proximal right internal carotid artery and mild calcification of the bilateral cavernous ICAs without hemodynamically significant stenosis. Otherwise, patent vasculature of the head and neck. 4. Enlarged and heterogeneous thyroid with a 1.4 cm nodule in the left. No specific imaging follow-up is required for this nodule. 5. Extensive dental disease as detailed above. Electronically Signed   By: PeValetta Mole.D.   On: 01/12/2021 16:13   DG Chest Port 1 View  Result Date: 01/12/2021 CLINICAL DATA:  Chest pain EXAM: PORTABLE CHEST 1 VIEW COMPARISON:  Chest radiograph 01/11/2021 FINDINGS: A right-sided dialysis catheter is stable in position. The cardiomediastinal silhouette is stable. The lungs clear, with no focal consolidation or pulmonary edema. There is no pleural effusion or pneumothorax. The bones are unremarkable. IMPRESSION: No radiographic evidence of acute cardiopulmonary process. Electronically Signed   By: PeValetta Mole.D.   On: 01/12/2021 16:20   CT HEAD CODE STROKE WO CONTRAST  Result Date: 01/12/2021 CLINICAL DATA:  Code stroke.  Slurred speech, weakness EXAM: CT HEAD WITHOUT CONTRAST TECHNIQUE: Contiguous axial images were obtained from the base of the skull through the vertex without intravenous contrast. COMPARISON:  CT head 01/11/2021 FINDINGS: Brain: There is no evidence of acute intracranial hemorrhage, extra-axial fluid collection, or infarct. Small remote lacunar infarcts in  the left basal ganglia are noted. Additional foci of hypodensity in the subcortical and periventricular white matter are nonspecific but may reflect sequela of multiple sclerosis, unchanged. There is no mass lesion. There is no midline shift. The ventricles are stable in size. Vascular: No hyperdense vessel or unexpected calcification. Skull: Normal. Negative for fracture or focal lesion. Sinuses/Orbits: No acute finding. Other: None. ASPECTS ([facility] Early CT Score) - Ganglionic level infarction (caudate, lentiform nuclei, internal capsule, insula, M1-M3 cortex): 7 - Supraganglionic infarction (M4-M6 cortex): 3 Total score (0-10 with 10 being normal): 10 IMPRESSION: 1. No acute intracranial pathology. 2. ASPECTS is 10. 3. If there is concern for infarct or active demyelination, MRI with and without contrast is recommended. These results were called by telephone at the time of interpretation on 01/12/2021 at 2:37 pm to provider ANLacretia Leigh who verbally acknowledged these results. Electronically Signed   By: PeValetta Mole.D.   On: 01/12/2021 14:38    Assessment/Plan: ESRD-Monday Wednesday Friday dialysis completed dialysis treatment 01/12/2021 Next dialysis treatment to be 01/14/2021.  ANEMIA-hemoglobin drop noted.  In center hemoglobin 10. MBD-patient takes PhosLo as a binder and Hectorol.  1 mcg once weekly. HTN/VOL-elevated blood pressures noted that dialysis blood pressures about [id number] as an outpatient.  This could indicate that the dry weight change needs to be made or change in medications. ACCESS-currently dialyzing through right IJ catheter Altered mental status.  Does not appear to have any intracranial pathology.  Could be related to her use of narcotic medications.  Usual dialysis prescription time:   4 hours   EDW    53.5 kg    K 3   Ca  2.5   LOS: 0 MaSherril Croon'@TODAY'C'@6'$ :29 AM

## 2021-01-13 NOTE — Plan of Care (Signed)
  Problem: Education: Goal: Knowledge of General Education information will improve Description: Including pain rating scale, medication(s)/side effects and non-pharmacologic comfort measures Outcome: Progressing   Problem: Health Behavior/Discharge Planning: Goal: Ability to manage health-related needs will improve Outcome: Progressing   Problem: Clinical Measurements: Goal: Ability to maintain clinical measurements within normal limits will improve Outcome: Progressing Goal: Will remain free from infection Outcome: Progressing Goal: Diagnostic test results will improve Outcome: Progressing Goal: Respiratory complications will improve Outcome: Progressing Goal: Cardiovascular complication will be avoided Outcome: Progressing   Problem: Activity: Goal: Risk for activity intolerance will decrease Outcome: Progressing   Problem: Nutrition: Goal: Adequate nutrition will be maintained Outcome: Progressing   Problem: Coping: Goal: Level of anxiety will decrease Outcome: Progressing   Problem: Elimination: Goal: Will not experience complications related to bowel motility Outcome: Progressing Goal: Will not experience complications related to urinary retention Outcome: Progressing   Problem: Pain Managment: Goal: General experience of comfort will improve Outcome: Progressing   Problem: Safety: Goal: Ability to remain free from injury will improve Outcome: Progressing   Problem: Skin Integrity: Goal: Risk for impaired skin integrity will decrease Outcome: Progressing   Problem: Education: Goal: Knowledge of disease or condition will improve Outcome: Progressing Goal: Knowledge of secondary prevention will improve Outcome: Progressing Goal: Knowledge of patient specific risk factors addressed and post discharge goals established will improve Outcome: Progressing Goal: Individualized Educational Video(s) Outcome: Progressing   Problem: Health Behavior/Discharge  Planning: Goal: Ability to manage health-related needs will improve Outcome: Progressing   Problem: Self-Care: Goal: Ability to participate in self-care as condition permits will improve Outcome: Progressing   Problem: Ischemic Stroke/TIA Tissue Perfusion: Goal: Complications of ischemic stroke/TIA will be minimized Outcome: Progressing

## 2021-01-13 NOTE — Plan of Care (Signed)
Problem: Education: Goal: Knowledge of General Education information will improve Description: Including pain rating scale, medication(s)/side effects and non-pharmacologic comfort measures 01/13/2021 1704 by Sachiko Methot, Rollen Sox, RN Outcome: Adequate for Discharge 01/13/2021 1100 by Lyden Redner, Rollen Sox, RN Outcome: Progressing   Problem: Health Behavior/Discharge Planning: Goal: Ability to manage health-related needs will improve 01/13/2021 1704 by Jaquanna Ballentine, Rollen Sox, RN Outcome: Adequate for Discharge 01/13/2021 1100 by Mariellen Blaney, Rollen Sox, RN Outcome: Progressing   Problem: Clinical Measurements: Goal: Ability to maintain clinical measurements within normal limits will improve 01/13/2021 1704 by Dauna Ziska, Rollen Sox, RN Outcome: Adequate for Discharge 01/13/2021 1100 by Estiben Mizuno, Rollen Sox, RN Outcome: Progressing Goal: Will remain free from infection 01/13/2021 1704 by Stafford Riviera, Rollen Sox, RN Outcome: Adequate for Discharge 01/13/2021 1100 by Josanna Hefel, Rollen Sox, RN Outcome: Progressing Goal: Diagnostic test results will improve 01/13/2021 1704 by Alajia Schmelzer, Rollen Sox, RN Outcome: Adequate for Discharge 01/13/2021 1100 by Chananya Canizalez, Rollen Sox, RN Outcome: Progressing Goal: Respiratory complications will improve 01/13/2021 1704 by Jake Fuhrmann, Rollen Sox, RN Outcome: Adequate for Discharge 01/13/2021 1100 by Cyntia Staley, Rollen Sox, RN Outcome: Progressing Goal: Cardiovascular complication will be avoided 01/13/2021 1704 by Maurianna Benard, Rollen Sox, RN Outcome: Adequate for Discharge 01/13/2021 1100 by Ariannah Arenson, Rollen Sox, RN Outcome: Progressing   Problem: Activity: Goal: Risk for activity intolerance will decrease 01/13/2021 1704 by Mickie Badders, Rollen Sox, RN Outcome: Adequate for Discharge 01/13/2021 1100 by Ryker Pherigo, Rollen Sox, RN Outcome: Progressing   Problem: Nutrition: Goal: Adequate nutrition will be maintained 01/13/2021 1704 by Stepan Verrette, Rollen Sox, RN Outcome: Adequate for Discharge 01/13/2021 1100 by  Lisbeth Puller, Rollen Sox, RN Outcome: Progressing   Problem: Coping: Goal: Level of anxiety will decrease 01/13/2021 1704 by Cybele Maule, Rollen Sox, RN Outcome: Adequate for Discharge 01/13/2021 1100 by Domenique Quest, Rollen Sox, RN Outcome: Progressing   Problem: Elimination: Goal: Will not experience complications related to bowel motility 01/13/2021 1704 by Annagrace Carr, Rollen Sox, RN Outcome: Adequate for Discharge 01/13/2021 1100 by Aroush Chasse, Rollen Sox, RN Outcome: Progressing Goal: Will not experience complications related to urinary retention 01/13/2021 1704 by Shaneice Barsanti, Rollen Sox, RN Outcome: Adequate for Discharge 01/13/2021 1100 by Handy Mcloud, Rollen Sox, RN Outcome: Progressing   Problem: Pain Managment: Goal: General experience of comfort will improve 01/13/2021 1704 by Hue Frick, Rollen Sox, RN Outcome: Adequate for Discharge 01/13/2021 1100 by Nashya Garlington L, RN Outcome: Progressing   Problem: Safety: Goal: Ability to remain free from injury will improve 01/13/2021 1704 by Madine Sarr, Rollen Sox, RN Outcome: Adequate for Discharge 01/13/2021 1100 by Jalasia Eskridge L, RN Outcome: Progressing   Problem: Skin Integrity: Goal: Risk for impaired skin integrity will decrease 01/13/2021 1704 by Caterra Ostroff, Rollen Sox, RN Outcome: Adequate for Discharge 01/13/2021 1100 by Shawntavia Saunders, Rollen Sox, RN Outcome: Progressing   Problem: Education: Goal: Knowledge of disease or condition will improve 01/13/2021 1704 by Yesha Muchow, Rollen Sox, RN Outcome: Adequate for Discharge 01/13/2021 1100 by Smith Potenza, Rollen Sox, RN Outcome: Progressing Goal: Knowledge of secondary prevention will improve 01/13/2021 1704 by Alexandros Ewan, Rollen Sox, RN Outcome: Adequate for Discharge 01/13/2021 1100 by Miarose Lippert, Rollen Sox, RN Outcome: Progressing Goal: Knowledge of patient specific risk factors addressed and post discharge goals established will improve 01/13/2021 1704 by Kamee Bobst, Rollen Sox, RN Outcome: Adequate for Discharge 01/13/2021 1100  by Yenifer Saccente, Rollen Sox, RN Outcome: Progressing Goal: Individualized Educational Video(s) 01/13/2021 1704 by Duncan Dull, RN Outcome: Adequate for Discharge 01/13/2021 1100 by Alexy Bringle, Rollen Sox, RN Outcome: Progressing   Problem: Health Behavior/Discharge Planning: Goal: Ability to manage health-related needs will improve 01/13/2021  1704 by Duncan Dull, RN Outcome: Adequate for Discharge 01/13/2021 1100 by Jamorris Ndiaye, Rollen Sox, RN Outcome: Progressing   Problem: Self-Care: Goal: Ability to participate in self-care as condition permits will improve 01/13/2021 1704 by Naven Giambalvo, Rollen Sox, RN Outcome: Adequate for Discharge 01/13/2021 1100 by Panhia Karl, Rollen Sox, RN Outcome: Progressing   Problem: Ischemic Stroke/TIA Tissue Perfusion: Goal: Complications of ischemic stroke/TIA will be minimized 01/13/2021 1704 by Nikola Marone, Rollen Sox, RN Outcome: Adequate for Discharge 01/13/2021 1100 by Sundeep Cary, Rollen Sox, RN Outcome: Progressing

## 2021-01-13 NOTE — Progress Notes (Signed)
STROKE MD NOTE Patient was evaluated by me yesterday as a telestroke while in [place], ER for altered mental status and speech difficulties and presented as a code stroke.  Patient refused thrombolysis with TNKase.  CT head was unremarkable CT angiogram showed no large vessel stenosis or occlusion.  MRI scan shows no acute infarct either.  LDL cholesterol is 129 mg percent.  Hemoglobin A1c is 8.4.  Echocardiogram shows normal ejection fraction without cardiac source of embolism.  Patient's condition appears to be improving.  She is tested positive for COVID.  Therapy evaluation recommended home health. Continue Plavix for stroke prevention and aggressive risk factor modification.  Medical management for COVID and medical issues as per primary team. Stroke team will sign off.  Kindly call for questions.  Discussed with Dr. Theone Stanley MD

## 2021-01-13 NOTE — Evaluation (Signed)
Speech Language Pathology Evaluation Patient Details Name: Hannah [name] MRN: [id number] DOB: 07-Nov-1965 Today's Date: 01/13/2021 Time: 1340-1405 SLP Time Calculation (min) (ACUTE ONLY): 25 min  Problem List:  Patient Active Problem List   Diagnosis Date Noted   Altered mental status, unspecified 01/12/2021   Other chronic postprocedural pain 01/09/2021   Past heart attack 11/13/2020   Spastic hemiplegia due to cerebrovascular disease ([place]) 11/03/2020   Debility 10/18/2020   ESRD on hemodialysis (HCC)    SOB (shortness of breath) 09/27/2020   Volume overload 09/26/2020   CKD (chronic kidney disease) stage 5, GFR less than 15 ml/min ([place]) 09/13/2020   H/O: CVA (cerebrovascular accident) 09/12/2020   Acute blood loss anemia    Chronic kidney disease (CKD), stage IV (severe) ([place])    Uncontrolled type 2 diabetes mellitus with hyperglycemia ([place])    Essential hypertension    Right pontine cerebrovascular accident ([place]) 06/12/2020   Stroke-like symptoms 06/09/2020   Acute kidney injury superimposed on CKD ([place]) 06/09/2020   Cerebral thrombosis with cerebral infarction 06/09/2020   Hypertension    Hyperlipidemia    Diabetes type 2, controlled ([place])    Bimalleolar ankle fracture 11/24/2018   Lower abdominal pain 07/28/2017   Bladder mass 07/18/2017   Painless hematuria 07/18/2017   Intractable vomiting with nausea 07/12/2017   Hemiparesis affecting right side as late effect of cerebrovascular accident (CVA) ([place]) 09/22/2016   AKI (acute kidney injury) ([place]) 01/08/2016   CAD (coronary artery disease) 01/07/2016   Multiple sclerosis ([place]) 2009   Past Medical History:  Past Medical History:  Diagnosis Date   Cerebral infarction ([place]) 07/12/2017   2009 first one, has had a total of 4 stokes   Chronic kidney disease    tues thurs sat - dialysis   Diabetes type 2, controlled ([place])    Hemiparesis affecting right side as late effect of cerebrovascular accident (CVA) ([place])     Hyperlipidemia    Hypertension    Multiple sclerosis ([place]) 2009   Stroke [facility])    Walker as ambulation aid    Past Surgical History:  Past Surgical History:  Procedure Laterality Date   APPENDECTOMY     AV FISTULA PLACEMENT Right 10/02/2020   Procedure: RIGHT ARTERIOVENOUS (AV) FISTULA CREATION;  Surgeon: Waynetta Sandy, MD;  Location: [facility];  Service: Vascular;  Laterality: Right;   [place] Right 11/24/2020   Procedure: SECOND STAGE RIGHT ARM [place];  Surgeon: Marty Heck, MD;  Location: [facility];  Service: Vascular;  Laterality: Right;   CYSTOSTOMY W/ BLADDER BIOPSY     IR FLUORO GUIDE CV LINE RIGHT  09/29/2020   IR US GUIDE VASC ACCESS RIGHT  09/29/2020   OVARIAN CYST SURGERY     stent placed in heart     HPI:  55yo female admitted 01/12/21 with AMS, N/V and worsening dysarthria. PMH: ESRD on HD, HTN, HLD, DM2, unspecified multiple CVAs (4) with residual right sided hemiparesis (incl R facial droop) and speech difficulties, MS, chronic RUW pain. ST at [facility] in January 2022   Assessment / Plan / Recommendation Clinical Impression  The Mini-Mental State Examination (MMSE) was administered to assess cognitive linguistic function. Of note, pt underwent rehab stay at [facility] in February 2022, and was noted to demonstrate mild cognitive impairments in awareness, problem solving, recall, and abstract thinking at discharge. Pt also with baseline dysarthria s/p previous strokes. Pt scored 28/30 on MMSE, with deficits noted on orientation to date, and recall of 2/3  unrelated words after a short delay. Pt continues to exhibit mild-moderate dysarthria, but reports she is at her baseline in terms of intelligibility. Recommend outpatient speech therapy if needs arise after DC. At this time, pt reports she has returned to her baseline level of function. Please reconsult if needs arise.    SLP Assessment  SLP Recommendation/Assessment: All further Speech  Language Pathology  needs can be addressed in the next venue of care if needs arise  SLP Visit Diagnosis: Dysarthria and anarthria (R47.1)             SLP Evaluation Cognition  Overall Cognitive Status: History of cognitive impairments - at baseline Arousal/Alertness: Awake/alert Orientation Level: Oriented X4 Year: 2022 Month: August Attention: Focused;Sustained Focused Attention: Appears intact Sustained Attention: Appears intact Memory: Impaired Memory Impairment: Retrieval deficit;Decreased short term memory Decreased Short Term Memory: Verbal basic Immediate Memory Recall: Sock;Blue;Bed Memory Recall Sock: Without Cue Memory Recall Blue: Without Cue Memory Recall Bed: With Cue       Comprehension  Auditory Comprehension Overall Auditory Comprehension: Appears within functional limits for tasks assessed Reading Comprehension Reading Status: Within funtional limits    Expression Expression Primary Mode of Expression: Verbal Verbal Expression Overall Verbal Expression: Appears within functional limits for tasks assessed Written Expression Dominant Hand: Left Written Expression: Within Functional Limits   Oral / Motor  Oral Motor/Sensory Function Overall Oral Motor/Sensory Function: Generalized oral weakness Motor Speech Overall Motor Speech: Impaired at baseline Respiration: Within functional limits Phonation: Normal Resonance: Within functional limits Articulation: Impaired Level of Impairment: Conversation Intelligibility: Intelligibility reduced Word: 75-100% accurate Phrase: 75-100% accurate Sentence: 75-100% accurate Conversation: 75-100% accurate Motor Planning: Witnin functional limits Motor Speech Errors: Not applicable Interfering Components: Premorbid status Effective Techniques: Slow rate;Over-articulate   GO                   Avin Upperman B. Quentin Ore, [facility], [place] Speech Language Pathologist Office: [phone] Pager: [phone]  Shonna Chock 01/13/2021, 2:24 PM

## 2021-01-15 ENCOUNTER — Other Ambulatory Visit: Payer: Self-pay | Admitting: *Deleted

## 2021-01-15 NOTE — Patient Outreach (Signed)
[facility]) Care Management  01/15/2021  Hannah [name] 1965-10-21 [id number]   Telephone Assessment   Referral Date : 620/22 Referral source: [facility] hospital liasion Referral reason : Complex care and disease management follow up call, assess for further needs. Insurance: [employer]  2 ED visits in the last month Hospital admission - [name] 8/29-8/30/22 Alerted mental status . Positive Covid 19 around  8/2 and positive test 01/12/21.     Subjective  Unsuccessful outreach call to patient, no answer able to leave a HIPAA compliant voicemail message for return call.   Plan If no return call will outreach in the next 4 business days at already scheduled follow up call.    Joylene Draft, RN, BSN  [place] Management Coordinator  [phone]- Mobile [phone]- Toll Free Main Office

## 2021-01-15 NOTE — Discharge Summary (Signed)
[facility] Discharge Summary   Patient: Hannah [name] [id number]  PCP: Vernie Shanks, MD  Date of admission: 01/12/2021   Date of discharge: 01/13/2021      Discharge Diagnoses:  Principal Problem:   Altered mental status, unspecified Active Problems:   Hyperlipidemia   Uncontrolled type 2 diabetes mellitus with hyperglycemia ([place])   Essential hypertension   H/O: CVA (cerebrovascular accident)   ESRD on hemodialysis ([place])  Admitted From: home Disposition:  Home   Recommendations for Outpatient Follow-up:  PCP: follow up with PCP in 1 week   Follow-up Information     [facility] Follow up.   Specialty: Home Health Services Why: [facility]--The home health agency will contact you for the first home visit. Contact information: [place] [phone]         Vernie Shanks, MD. Schedule an appointment as soon as possible for a visit in 1 week(s).   Specialty: Family Medicine Contact information: [place] [address] [phone]                Discharge Instructions     Diet renal [id number]   Complete by: As directed    Increase activity slowly   Complete by: As directed        Diet recommendation: Renal diet  Activity: The patient is advised to gradually reintroduce usual activities, as tolerated  Discharge Condition: stable  Code Status: Full code   History of present illness: As per the H and P dictated on admission, "Hannah [name] is a 55 y.o. female with medical history significant of end-stage renal disease on hemodialysis, hypertension, hyperlipidemia, diabetes mellitus type 2, unspecified multiple CVA (as per the daughter) with residual right-sided hemiparesis and some speech difficulties, reported history of multiple sclerosis, chronic right upper extremity pain (questionable secondary to AV fistula) recently placed on methadone with resulting vomiting and ED presentation  on 01/11/2021.  She presented today with altered mental status.  History is provided mostly by the daughter present at bedside.  Patient is more awake at the time of my examination but still a poor historian.  Patient apparently had a normal session of dialysis today and felt fine afterwards.  As per the daughter, while driving to her next appointment, patient felt weak and had some transient chest discomfort and subsequently she was less responsive.  There was no loss of consciousness, headache, trauma to head or opiate use in the last 12 to 24 hours.  No reported fevers, worsening shortness of breath, cough, abdominal pain, diarrhea or dysuria reported.  Daughter reports that patient has right facial droop which is at baseline but and worsening right upper extremity weakness today.   ED Course: Code stroke was called.  CT of the head was negative for acute intracranial bleed.  EKG without any acute ischemic findings.  Neurology was consulted by ED provider who recommended TNKase but patient refused it.  CT angio of head showed no hemodynamically significant stenosis or occlusion. Hospitalist service was called to evaluate the patient"  Hospital Course:  Summary of her active problems in the hospital is as following.  Altered mental status worsening slurring of speech and right-sided weakness in a patient with history of unspecified CVAs and residual right-sided weakness and speech abnormality  -Code stroke was called.   CT of the head was negative for acute intracranial bleed.  EKG without any acute ischemic findings.   Neurology was consulted by ED provider who  recommended TNKase but patient refused it.   CT angio of head showed no hemodynamically significant stenosis or occlusion. -As per ED provider, patient mental status has improved significantly since presenting to the ED including patient being more awake and improving speech and right-sided weakness. MRI of brain without contrast  negative 2D Echocardiogram negative for any source of embolization -Patient already on Plavix at home.  Continue Plavix.  Lipitor 80 mg nightly   End-stage renal disease on hemodialysis -Completed hemodialysis. No signs of volume overload. Resume home regimen on discharge.    Diabetes mellitus type 2 Resume home regimen   Anemia of chronic disease -From renal failure.  Hemoglobin stable.  No signs of bleeding.  Chronic right upper extremity pain -Recently started on methadone which patient did not tolerate.  Will not continue methadone.  We will hold off on any sedatives as well for now.  Outpatient follow-up with PCP.     History of multiple sclerosis -Follow neurology recommendations  Patient was ambulatory without any assistance. On the day of the discharge the patient's vitals were stable, and no other new acute medical condition were reported. The patient was felt safe to be discharge at Home with no therapy needed on discharge.  Consultants: Neurology  Procedures: none  DISCHARGE MEDICATION: Allergies as of 01/13/2021       Reactions   Ibuprofen Swelling   Swelling to hands, feet and face   Influenza Vaccines Anaphylaxis, Swelling   fever   Pneumococcal Vaccine Anaphylaxis, Swelling   fever   Promethazine Other (See Comments)   hallucinations   Ambien [zolpidem] Other (See Comments)   Unknown reaction   Cyclobenzaprine Other (See Comments)   hallucinations   Tape Itching   Tramadol Hcl Other (See Comments)   hallucinations   Oxycodone Nausea And Vomiting        Medication List     STOP taking these medications    gabapentin 300 MG capsule Commonly known as: NEURONTIN   methadone 5 MG tablet Commonly known as: Dolophine       TAKE these medications    amLODipine 10 MG tablet Commonly known as: NORVASC Take 1 tablet (10 mg total) by mouth daily.   atorvastatin 80 MG tablet Commonly known as: LIPITOR Take 1 tablet (80 mg total) by mouth at  bedtime.   calcium acetate 667 MG capsule Commonly known as: PHOSLO Take 667 mg by mouth See admin instructions. Take one capsule (667 mg) by mouth  up to 3 times daily - with each meal and snacks   carvedilol 25 MG tablet Commonly known as: COREG Take 1 tablet (25 mg total) by mouth 2 (two) times daily with a meal.   clopidogrel 75 MG tablet Commonly known as: PLAVIX Take 1 tablet (75 mg total) by mouth daily.   dantrolene 100 MG capsule Commonly known as: DANTRIUM Take 1 capsule (100 mg total) by mouth 2 (two) times daily.   diclofenac Sodium 1 % Gel Commonly known as: VOLTAREN Apply 2 g topically 4 (four) times daily. To leg What changed:  when to take this reasons to take this   famotidine 20 MG tablet Commonly known as: PEPCID Take 1 tablet (20 mg total) by mouth daily.   FreeStyle Libre 2 Sensor Misc USE AS DIRECTED CHANGE EVERY 14 DAYS   hydrALAZINE 50 MG tablet Commonly known as: APRESOLINE Take 1 tablet (50 mg total) by mouth every 8 (eight) hours. What changed:  when to take this additional instructions  hydrOXYzine 10 MG tablet Commonly known as: ATARAX/VISTARIL Take 1 tablet (10 mg total) by mouth 3 (three) times daily as needed for up to 7 days for itching. What changed: reasons to take this   insulin lispro 100 UNIT/ML KwikPen Commonly known as: HUMALOG Inject 5 Units into the skin 3 (three) times daily. What changed:  how much to take when to take this additional instructions   melatonin 5 MG Tabs Take 5 mg by mouth at bedtime.   nitroGLYCERIN 0.4 MG SL tablet Commonly known as: NITROSTAT Place 1 tablet (0.4 mg total) under the tongue every 5 (five) minutes as needed for chest pain.   ondansetron 4 MG tablet Commonly known as: ZOFRAN Take 4-8 mg by mouth 3 (three) times daily as needed for nausea or vomiting.   polyethylene glycol 17 g packet Commonly known as: MIRALAX / GLYCOLAX Take 17 g by mouth daily. What changed:  when to take  this reasons to take this   senna-docusate 8.6-50 MG tablet Commonly known as: Senokot-S Take 1 tablet by mouth 2 (two) times daily as needed for moderate constipation.   tiZANidine 2 MG tablet Commonly known as: ZANAFLEX Take 1 tablet (2 mg total) by mouth at bedtime. What changed: when to take this   [place] FlexTouch 100 UNIT/ML FlexTouch Pen Generic drug: insulin degludec Inject 12 Units into the skin at bedtime. What changed:  how much to take additional instructions       ASK your doctor about these medications    oxyCODONE-acetaminophen 5-325 MG tablet Commonly known as: PERCOCET/ROXICET Take 1 tablet by mouth every 6 (six) hours as needed for up to 3 days for severe pain (Patient taking differently, states that she is taking only 1/2 tablet). Ask about: Should I take this medication?        Discharge Exam: Filed Weights   01/12/21 1406  Weight: 52.2 kg   Vitals:   01/13/21 1149 01/13/21 1545  BP: (!) 186/76 (!) 168/62  Pulse: 81 82  Resp:  18  Temp: 97.8 F (36.6 C) 98.1 F (36.7 C)  SpO2: 100%    General: Appear in mild distress, no Rash; Oral Mucosa Clear, moist. no Abnormal Neck Mass Or lumps, Conjunctiva normal  Cardiovascular: S1 and S2 Present, no Murmur, Respiratory: good respiratory effort, Bilateral Air entry present and CTA, no Crackles, no wheezes Abdomen: Bowel Sound present, Soft and no tenderness Extremities: no Pedal edema Neurology: alert and oriented to time, place, and person affect appropriate. no new focal deficit Gait not checked due to patient safety concerns   The results of significant diagnostics from this hospitalization (including imaging, microbiology, ancillary and laboratory) are listed below for reference.    Significant Diagnostic Studies: CT Angio Head W or Wo Contrast  Result Date: 01/12/2021 CLINICAL DATA:  Follow up code stroke, unable to move without assistance EXAM: CT ANGIOGRAPHY HEAD AND NECK CT PERFUSION  BRAIN TECHNIQUE: Multidetector CT imaging of the head and neck was performed using the standard protocol during bolus administration of intravenous contrast. Multiplanar CT image reconstructions and MIPs were obtained to evaluate the vascular anatomy. Carotid stenosis measurements (when applicable) are obtained utilizing NASCET criteria, using the distal internal carotid diameter as the denominator. Multiphase CT imaging of the brain was performed following IV bolus contrast injection. Subsequent parametric perfusion maps were calculated using RAPID software. CONTRAST:  131m OMNIPAQUE IOHEXOL 350 MG/ML SOLN COMPARISON:  Same-day noncontrast CT head FINDINGS: CTA NECK FINDINGS Aortic arch: Standard branching. Imaged portion shows  no evidence of aneurysm or dissection. No significant stenosis of the major arch vessel origins. Right carotid system: There is mild calcified atherosclerotic plaque at the right carotid bulb resulting in approximately 40-50% stenosis. The distal right internal carotid artery is widely patent. There is no evidence of dissection or aneurysm. Left carotid system: No evidence of dissection, stenosis (50% or greater) or occlusion. Vertebral arteries: The bilateral vertebral arteries are otherwise patent, with no evidence of dissection, hemodynamically significant stenosis, or occlusion. Skeleton: There is mild degenerative change of the cervical spine, most advanced at C4-C5. Other neck: The thyroid is diffusely enlarged and heterogeneous with a single nodule measuring up to 1.4 cm. A right-sided dialysis catheter is noted. The tip is not evaluated. There is a mucous retention cyst in the left sphenoid sinus. There are numerous dental caries and periapical lucencies around the roots of multiple teeth, most notably the posterior most left maxillary molar. Upper chest: The lung apices are clear. Review of the MIP images confirms the above findings CTA HEAD FINDINGS Anterior circulation: There is  calcification of the bilateral cavernous ICAs without hemodynamically significant stenosis or occlusion. The bilateral MCAs and ACAs are patent. There is no aneurysm. Posterior circulation: The V4 segments of the vertebral arteries are patent. The basilar artery is patent. The bilateral PCAs are patent. There is no aneurysm. Venous sinuses: As permitted by contrast timing, patent. Anatomic variants: None. Review of the MIP images confirms the above findings CT Brain Perfusion Findings: ASPECTS: 10 CBF (<30%) Volume: 20m Perfusion (Tmax>6.0s) volume: 045mMismatch Volume: n/a Infarction Location:n/a IMPRESSION: 1. No large vessel occlusion or dissection identified. 2. No infarct core or penumbra identified. 3. Approximately 50% stenosis of the proximal right internal carotid artery and mild calcification of the bilateral cavernous ICAs without hemodynamically significant stenosis. Otherwise, patent vasculature of the head and neck. 4. Enlarged and heterogeneous thyroid with a 1.4 cm nodule in the left. No specific imaging follow-up is required for this nodule. 5. Extensive dental disease as detailed above. Electronically Signed   By: PeValetta Mole.D.   On: 01/12/2021 16:13   DG Chest 1 View  Result Date: 01/11/2021 CLINICAL DATA:  Weakness. EXAM: CHEST  1 VIEW COMPARISON:  December 18, 2020. FINDINGS: The heart size and mediastinal contours are within normal limits. Both lungs are clear. Right internal jugular dialysis catheter is unchanged. The visualized skeletal structures are unremarkable. IMPRESSION: No active disease. Electronically Signed   By: JaMarijo Conception.D.   On: 01/11/2021 10:58   CT HEAD WO CONTRAST (5MM)  Result Date: 01/11/2021 CLINICAL DATA:  Dizziness, non-specific EXAM: CT HEAD WITHOUT CONTRAST TECHNIQUE: Contiguous axial images were obtained from the base of the skull through the vertex without intravenous contrast. COMPARISON:  Oct 03, 2020, June 09, 2020 FINDINGS: Brain: No evidence  of acute infarction, hemorrhage, hydrocephalus, extra-axial collection or mass lesion/mass effect. Again noted are multiple areas of hypodensity predominantly involving the LEFT greater than RIGHT periventricular and subcortical white matter. This likely reflects the sequela of chronic microvascular ischemic disease superimposed on reported history of multiple sclerosis. There is a focal area of cortical hypodensity in the LEFT parietal lobe which is unchanged since June 09, 2020 MRI and likely reflects sequela of prior infarction (series 2, image 23). Remote bilateral basal ganglia lacunar infarction. Hypodensity of the pons, unchanged. Vascular: Mild vascular calcifications of the carotid siphons. Skull: Normal. Negative for fracture or focal lesion. Sinuses/Orbits: No acute finding. Other: None. IMPRESSION: No acute intracranial abnormality.  If concern for acute stroke or active demyelinating lesion, recommend dedicated brain MRI with and without contrast. Electronically Signed   By: Valentino Saxon M.D.   On: 01/11/2021 10:19   CT Angio Neck W and/or Wo Contrast  Result Date: 01/12/2021 CLINICAL DATA:  Follow up code stroke, unable to move without assistance EXAM: CT ANGIOGRAPHY HEAD AND NECK CT PERFUSION BRAIN TECHNIQUE: Multidetector CT imaging of the head and neck was performed using the standard protocol during bolus administration of intravenous contrast. Multiplanar CT image reconstructions and MIPs were obtained to evaluate the vascular anatomy. Carotid stenosis measurements (when applicable) are obtained utilizing NASCET criteria, using the distal internal carotid diameter as the denominator. Multiphase CT imaging of the brain was performed following IV bolus contrast injection. Subsequent parametric perfusion maps were calculated using RAPID software. CONTRAST:  161m OMNIPAQUE IOHEXOL 350 MG/ML SOLN COMPARISON:  Same-day noncontrast CT head FINDINGS: CTA NECK FINDINGS Aortic arch: Standard  branching. Imaged portion shows no evidence of aneurysm or dissection. No significant stenosis of the major arch vessel origins. Right carotid system: There is mild calcified atherosclerotic plaque at the right carotid bulb resulting in approximately 40-50% stenosis. The distal right internal carotid artery is widely patent. There is no evidence of dissection or aneurysm. Left carotid system: No evidence of dissection, stenosis (50% or greater) or occlusion. Vertebral arteries: The bilateral vertebral arteries are otherwise patent, with no evidence of dissection, hemodynamically significant stenosis, or occlusion. Skeleton: There is mild degenerative change of the cervical spine, most advanced at C4-C5. Other neck: The thyroid is diffusely enlarged and heterogeneous with a single nodule measuring up to 1.4 cm. A right-sided dialysis catheter is noted. The tip is not evaluated. There is a mucous retention cyst in the left sphenoid sinus. There are numerous dental caries and periapical lucencies around the roots of multiple teeth, most notably the posterior most left maxillary molar. Upper chest: The lung apices are clear. Review of the MIP images confirms the above findings CTA HEAD FINDINGS Anterior circulation: There is calcification of the bilateral cavernous ICAs without hemodynamically significant stenosis or occlusion. The bilateral MCAs and ACAs are patent. There is no aneurysm. Posterior circulation: The V4 segments of the vertebral arteries are patent. The basilar artery is patent. The bilateral PCAs are patent. There is no aneurysm. Venous sinuses: As permitted by contrast timing, patent. Anatomic variants: None. Review of the MIP images confirms the above findings CT Brain Perfusion Findings: ASPECTS: 10 CBF (<30%) Volume: 042mPerfusion (Tmax>6.0s) volume: 44m41mismatch Volume: n/a Infarction Location:n/a IMPRESSION: 1. No large vessel occlusion or dissection identified. 2. No infarct core or penumbra  identified. 3. Approximately 50% stenosis of the proximal right internal carotid artery and mild calcification of the bilateral cavernous ICAs without hemodynamically significant stenosis. Otherwise, patent vasculature of the head and neck. 4. Enlarged and heterogeneous thyroid with a 1.4 cm nodule in the left. No specific imaging follow-up is required for this nodule. 5. Extensive dental disease as detailed above. Electronically Signed   By: PetValetta MoleD.   On: 01/12/2021 16:13   MR BRAIN WO CONTRAST  Result Date: 01/13/2021 CLINICAL DATA:  TIA EXAM: MRI HEAD WITHOUT CONTRAST TECHNIQUE: Multiplanar, multiecho pulse sequences of the brain and surrounding structures were obtained without intravenous contrast. COMPARISON:  None. FINDINGS: Brain: No acute infarct, mass effect or extra-axial collection. Numerous foci of chronic microhemorrhage in a predominantly central distribution. Hyperintense T2-weighted signal is moderately widespread throughout the white matter. Generalized volume loss without a clear  lobar predilection. The midline structures are normal. Vascular: Major flow voids are preserved. Skull and upper cervical spine: Normal calvarium and skull base. Visualized upper cervical spine and soft tissues are normal. Sinuses/Orbits:No paranasal sinus fluid levels or advanced mucosal thickening. No mastoid or middle ear effusion. Normal orbits. IMPRESSION: 1. No acute intracranial abnormality. 2. Numerous foci of chronic microhemorrhage in a predominantly central distribution, most consistent with chronic hypertensive angiopathy. Electronically Signed   By: Ulyses Jarred M.D.   On: 01/13/2021 00:22   CT CEREBRAL PERFUSION W CONTRAST  Result Date: 01/12/2021 CLINICAL DATA:  Follow up code stroke, unable to move without assistance EXAM: CT ANGIOGRAPHY HEAD AND NECK CT PERFUSION BRAIN TECHNIQUE: Multidetector CT imaging of the head and neck was performed using the standard protocol during bolus  administration of intravenous contrast. Multiplanar CT image reconstructions and MIPs were obtained to evaluate the vascular anatomy. Carotid stenosis measurements (when applicable) are obtained utilizing NASCET criteria, using the distal internal carotid diameter as the denominator. Multiphase CT imaging of the brain was performed following IV bolus contrast injection. Subsequent parametric perfusion maps were calculated using RAPID software. CONTRAST:  14m OMNIPAQUE IOHEXOL 350 MG/ML SOLN COMPARISON:  Same-day noncontrast CT head FINDINGS: CTA NECK FINDINGS Aortic arch: Standard branching. Imaged portion shows no evidence of aneurysm or dissection. No significant stenosis of the major arch vessel origins. Right carotid system: There is mild calcified atherosclerotic plaque at the right carotid bulb resulting in approximately 40-50% stenosis. The distal right internal carotid artery is widely patent. There is no evidence of dissection or aneurysm. Left carotid system: No evidence of dissection, stenosis (50% or greater) or occlusion. Vertebral arteries: The bilateral vertebral arteries are otherwise patent, with no evidence of dissection, hemodynamically significant stenosis, or occlusion. Skeleton: There is mild degenerative change of the cervical spine, most advanced at C4-C5. Other neck: The thyroid is diffusely enlarged and heterogeneous with a single nodule measuring up to 1.4 cm. A right-sided dialysis catheter is noted. The tip is not evaluated. There is a mucous retention cyst in the left sphenoid sinus. There are numerous dental caries and periapical lucencies around the roots of multiple teeth, most notably the posterior most left maxillary molar. Upper chest: The lung apices are clear. Review of the MIP images confirms the above findings CTA HEAD FINDINGS Anterior circulation: There is calcification of the bilateral cavernous ICAs without hemodynamically significant stenosis or occlusion. The bilateral  MCAs and ACAs are patent. There is no aneurysm. Posterior circulation: The V4 segments of the vertebral arteries are patent. The basilar artery is patent. The bilateral PCAs are patent. There is no aneurysm. Venous sinuses: As permitted by contrast timing, patent. Anatomic variants: None. Review of the MIP images confirms the above findings CT Brain Perfusion Findings: ASPECTS: 10 CBF (<30%) Volume: 027mPerfusion (Tmax>6.0s) volume: 74m574mismatch Volume: n/a Infarction Location:n/a IMPRESSION: 1. No large vessel occlusion or dissection identified. 2. No infarct core or penumbra identified. 3. Approximately 50% stenosis of the proximal right internal carotid artery and mild calcification of the bilateral cavernous ICAs without hemodynamically significant stenosis. Otherwise, patent vasculature of the head and neck. 4. Enlarged and heterogeneous thyroid with a 1.4 cm nodule in the left. No specific imaging follow-up is required for this nodule. 5. Extensive dental disease as detailed above. Electronically Signed   By: PetValetta MoleD.   On: 01/12/2021 16:13   DG Chest Port 1 View  Result Date: 01/12/2021 CLINICAL DATA:  Chest pain EXAM: PORTABLE CHEST 1 VIEW  COMPARISON:  Chest radiograph 01/11/2021 FINDINGS: A right-sided dialysis catheter is stable in position. The cardiomediastinal silhouette is stable. The lungs clear, with no focal consolidation or pulmonary edema. There is no pleural effusion or pneumothorax. The bones are unremarkable. IMPRESSION: No radiographic evidence of acute cardiopulmonary process. Electronically Signed   By: Valetta Mole M.D.   On: 01/12/2021 16:20   DG Chest Portable 1 View  Result Date: 12/18/2020 CLINICAL DATA:  Cough, fever, COVID positive EXAM: PORTABLE CHEST 1 VIEW COMPARISON:  10/03/2020 FINDINGS: Cardiomegaly. Large-bore right neck multi lumen vascular catheter. Both lungs are clear. The visualized skeletal structures are unremarkable. IMPRESSION: Cardiomegaly without  acute abnormality of the lungs in AP portable projection. Electronically Signed   By: Eddie Candle M.D.   On: 12/18/2020 10:55   CT HEAD CODE STROKE WO CONTRAST  Result Date: 01/12/2021 CLINICAL DATA:  Code stroke.  Slurred speech, weakness EXAM: CT HEAD WITHOUT CONTRAST TECHNIQUE: Contiguous axial images were obtained from the base of the skull through the vertex without intravenous contrast. COMPARISON:  CT head 01/11/2021 FINDINGS: Brain: There is no evidence of acute intracranial hemorrhage, extra-axial fluid collection, or infarct. Small remote lacunar infarcts in the left basal ganglia are noted. Additional foci of hypodensity in the subcortical and periventricular white matter are nonspecific but may reflect sequela of multiple sclerosis, unchanged. There is no mass lesion. There is no midline shift. The ventricles are stable in size. Vascular: No hyperdense vessel or unexpected calcification. Skull: Normal. Negative for fracture or focal lesion. Sinuses/Orbits: No acute finding. Other: None. ASPECTS [facility] Stroke Program Early CT Score) - Ganglionic level infarction (caudate, lentiform nuclei, internal capsule, insula, M1-M3 cortex): 7 - Supraganglionic infarction (M4-M6 cortex): 3 Total score (0-10 with 10 being normal): 10 IMPRESSION: 1. No acute intracranial pathology. 2. ASPECTS is 10. 3. If there is concern for infarct or active demyelination, MRI with and without contrast is recommended. These results were called by telephone at the time of interpretation on 01/12/2021 at 2:37 pm to provider Lacretia Leigh , who verbally acknowledged these results. Electronically Signed   By: Valetta Mole M.D.   On: 01/12/2021 14:38   ECHOCARDIOGRAM LIMITED  Result Date: 01/13/2021    ECHOCARDIOGRAM LIMITED REPORT   Patient Name:   TAMIKIA SIEBERT Date of Exam: 01/13/2021 Medical Rec #:  [id number]    Height:       62.0 in Accession #:    [id number]   Weight:       115.0 lb Date of Birth:  [birth year]   BSA:           1.511 m Patient Age:    55 years     BP:           169/73 mmHg Patient Gender: F            HR:           83 bpm. Exam Location:  Inpatient Procedure: 2D Echo, Limited Color Doppler and Cardiac Doppler Indications:    stroke  History:        Patient has prior history of Echocardiogram examinations, most                 recent 09/27/2020. End stage renal disease; Risk                 Factors:Hypertension, Diabetes and Dyslipidemia.  Sonographer:    Johny Chess RDCS Referring Phys: [id number] [place]  1. Left ventricular ejection fraction, by estimation, is  65 to 70%. The left ventricle has normal function. The left ventricle has no regional wall motion abnormalities. There is mild concentric left ventricular hypertrophy. Left ventricular diastolic parameters are consistent with Grade I diastolic dysfunction (impaired relaxation). Elevated left atrial pressure.  2. Right ventricular systolic function is normal. The right ventricular size is normal. There is normal pulmonary artery systolic pressure.  3. A small pericardial effusion is present. The pericardial effusion is circumferential. There is no evidence of cardiac tamponade.  4. The mitral valve is normal in structure. No evidence of mitral valve regurgitation. No evidence of mitral stenosis.  5. The aortic valve is tricuspid. There is mild thickening of the aortic valve. Aortic valve regurgitation is trivial. Mild aortic valve sclerosis is present, with no evidence of aortic valve stenosis.  6. The inferior vena cava is normal in size with greater than 50% respiratory variability, suggesting right atrial pressure of 3 mmHg. Comparison(s): No significant change from prior study. Prior images reviewed side by side. FINDINGS  Left Ventricle: Left ventricular ejection fraction, by estimation, is 65 to 70%. The left ventricle has normal function. The left ventricle has no regional wall motion abnormalities. The left ventricular internal cavity  size was normal in size. There is  mild concentric left ventricular hypertrophy. Left ventricular diastolic parameters are consistent with Grade I diastolic dysfunction (impaired relaxation). Elevated left atrial pressure. Right Ventricle: The right ventricular size is normal. No increase in right ventricular wall thickness. Right ventricular systolic function is normal. There is normal pulmonary artery systolic pressure. The tricuspid regurgitant velocity is 2.13 m/s, and  with an assumed right atrial pressure of 3 mmHg, the estimated right ventricular systolic pressure is 123456 mmHg. Left Atrium: Left atrial size was normal. Right Atrium: Right atrial size was normal. Pericardium: A small pericardial effusion is present. The pericardial effusion is circumferential. There is no evidence of cardiac tamponade. Mitral Valve: The mitral valve is normal in structure. No evidence of mitral valve stenosis. Tricuspid Valve: The tricuspid valve is normal in structure. Tricuspid valve regurgitation is mild . No evidence of tricuspid stenosis. Aortic Valve: The aortic valve is tricuspid. There is mild thickening of the aortic valve. Aortic valve regurgitation is trivial. Mild aortic valve sclerosis is present, with no evidence of aortic valve stenosis. Pulmonic Valve: The pulmonic valve was normal in structure. Pulmonic valve regurgitation is not visualized. No evidence of pulmonic stenosis. Aorta: The aortic root is normal in size and structure. Venous: The inferior vena cava is normal in size with greater than 50% respiratory variability, suggesting right atrial pressure of 3 mmHg. IAS/Shunts: No atrial level shunt detected by color flow Doppler. LEFT VENTRICLE PLAX 2D LVIDd:         4.10 cm Diastology LVIDs:         2.21 cm LV e' medial:    6.09 cm/s LV PW:         1.17 cm LV E/e' medial:  16.9 LV IVS:        1.09 cm LV e' lateral:   5.87 cm/s                        LV E/e' lateral: 17.5  LEFT ATRIUM         Index LA diam:     3.00 cm 1.99 cm/m  AORTIC VALVE LVOT Vmax:   116.00 cm/s LVOT Vmean:  75.900 cm/s LVOT VTI:    0.226 m  AORTA Ao Root diam:  2.80 cm Ao Asc diam:  2.80 cm MITRAL VALVE                TRICUSPID VALVE MV Area (PHT): 3.60 cm     TR Peak grad:   18.1 mmHg MV Decel Time: 211 msec     TR Vmax:        213.00 cm/s MV E velocity: 103.00 cm/s MV A velocity: 114.00 cm/s  SHUNTS MV E/A ratio:  0.90         Systemic VTI: 0.23 m Dani Gobble Croitoru MD Electronically signed by Sanda Klein MD Signature Date/Time: 01/13/2021/10:19:52 AM    Final     Microbiology: Recent Results (from the past 240 hour(s))  Resp Panel by RT-PCR (Flu A&B, Covid) Nasopharyngeal Swab     Status: Abnormal   Collection Time: 01/12/21  3:16 PM   Specimen: Nasopharyngeal Swab; Nasopharyngeal(NP) swabs in vial transport medium  Result Value Ref Range Status   SARS Coronavirus 2 by RT PCR POSITIVE (A) NEGATIVE Final    Comment: RESULT CALLED TO, READ BACK BY AND VERIFIED WITH: Carolin Guernsey, RN [phone] 01/12/21 KDS (NOTE) SARS-CoV-2 target nucleic acids are DETECTED.  The SARS-CoV-2 RNA is generally detectable in upper respiratory specimens during the acute phase of infection. Positive results are indicative of the presence of the identified virus, but do not rule out bacterial infection or co-infection with other pathogens not detected by the test. Clinical correlation with patient history and other diagnostic information is necessary to determine patient infection status. The expected result is Negative.  Fact Sheet for Patients: EntrepreneurPulse.com.au  Fact Sheet for Healthcare Providers: IncredibleEmployment.be  This test is not yet approved or cleared by the [place] FDA and  has been authorized for detection and/or diagnosis of SARS-CoV-2 by FDA under an Emergency Use Authorization (EUA).  This EUA will remain in effect (meaning this test can be u sed) for the duration of  the COVID-19  declaration under Section 564(b)(1) of the Act, 21 U.S.C. section 360bbb-3(b)(1), unless the authorization is terminated or revoked sooner.     Influenza A by PCR NEGATIVE NEGATIVE Final   Influenza B by PCR NEGATIVE NEGATIVE Final    Comment: (NOTE) The Xpert Xpress SARS-CoV-2/FLU/RSV plus assay is intended as an aid in the diagnosis of influenza from Nasopharyngeal swab specimens and should not be used as a sole basis for treatment. Nasal washings and aspirates are unacceptable for Xpert Xpress SARS-CoV-2/FLU/RSV testing.  Fact Sheet for Patients: EntrepreneurPulse.com.au  Fact Sheet for Healthcare Providers: IncredibleEmployment.be  This test is not yet approved or cleared by the [place] FDA and has been authorized for detection and/or diagnosis of SARS-CoV-2 by FDA under an Emergency Use Authorization (EUA). This EUA will remain in effect (meaning this test can be used) for the duration of the COVID-19 declaration under Section 564(b)(1) of the Act, 21 U.S.C. section 360bbb-3(b)(1), unless the authorization is terminated or revoked.  Performed at [facility], [place] [address], [place], [place] [address]      Labs: CBC: Recent Labs  Lab 01/11/21 0101 01/11/21 0926 01/12/21 1405 01/12/21 1419 01/13/21 0453  WBC 5.4 5.1 4.7  --  4.6  NEUTROABS  --  3.8 2.9  --  2.5  HGB 10.2* 10.1* 9.7* 10.2* 8.7*  HCT 33.7* 32.4* 30.1* 30.0* 26.5*  MCV 89.4 87.8 85.3  --  84.7  PLT 178 198 177  --  [id number]   Basic Metabolic Panel: Recent Labs  Lab 01/11/21 0101 01/11/21  [id number] 01/12/21 1405 01/12/21 1419 01/13/21 0453  NA 133* 132* 141 138 133*  K 3.8 4.0 3.5 3.8 3.5  CL 93* 91* 102 96* 97*  CO2 '30 28 31  '$ --  29  GLUCOSE 169* 231* 183* 189* 128*  BUN 6 13 <5* <3* 7  CREATININE 2.02* 2.67* 1.28* 1.10* 2.39*  CALCIUM 9.5 9.6 9.2  --  9.0  MG  --  2.1  --   --   --    Liver Function Tests: Recent Labs  Lab  01/11/21 0101 01/12/21 1405 01/13/21 0453  AST 16 17 13*  ALT '8 7 8  '$ ALKPHOS 53 45 46  BILITOT 0.7 0.6 0.5  PROT 7.6 7.4 6.2*  ALBUMIN 3.5 3.5 3.0*   CBG: Recent Labs  Lab 01/12/21 1354 01/13/21 0012 01/13/21 0624 01/13/21 1144 01/13/21 1648  GLUCAP 194* 159* 152* 86 88    Time spent: 35 minutes  Signed:  Berle Mull  [facility] 01/13/2021

## 2021-01-19 LAB — DRUG TOX MONITOR 1 W/CONF, ORAL FLD
Amobarbital: NEGATIVE ng/mL (ref <10)
Amphetamines: NEGATIVE ng/mL (ref <10)
Barbiturates: NEGATIVE ng/mL (ref <10)
Benzodiazepines: NEGATIVE ng/mL (ref <0.50)
Buprenorphine: NEGATIVE ng/mL (ref <0.10)
Butalbital: NEGATIVE ng/mL (ref <10)
Cocaine: NEGATIVE ng/mL (ref <5.0)
Fentanyl: NEGATIVE ng/mL (ref <0.10)
Heroin Metabolite: NEGATIVE ng/mL (ref <1.0)
MARIJUANA: NEGATIVE ng/mL (ref <2.5)
MDMA: NEGATIVE ng/mL (ref <10)
Meprobamate: NEGATIVE ng/mL (ref <2.5)
Methadone: NEGATIVE ng/mL (ref <5.0)
Nicotine Metabolite: NEGATIVE ng/mL (ref <5.0)
Opiates: NEGATIVE ng/mL (ref <2.5)
Pentobarbital: NEGATIVE ng/mL (ref <10)
Phencyclidine: NEGATIVE ng/mL (ref <10)
Phenobarbital: NEGATIVE ng/mL (ref <10)
Tapentadol: NEGATIVE ng/mL (ref <5.0)
Tramadol: >500 ng/mL — ABNORMAL HIGH (ref <5.0)
Tramadol: POSITIVE ng/mL — AB (ref <5.0)
Zolpidem: NEGATIVE ng/mL (ref <5.0)

## 2021-01-19 LAB — DRUG TOX ALC METAB W/CON, ORAL FLD: Alcohol Metabolite: NEGATIVE ng/mL (ref <25)

## 2021-01-21 ENCOUNTER — Other Ambulatory Visit: Payer: Self-pay | Admitting: *Deleted

## 2021-01-21 NOTE — Patient Outreach (Signed)
[facility] [facility]) Care Management  01/21/2021  Znya Elk 02-15-1966 [id number]   Telephone Assessment   Subjective  Unsuccessful outreach call to patient contact August Albino, no answer able to leave a HIPAA compliant voicemail message for return call.   Placed call to [facility] to verify if patient home health services in place , representative states plan for return call to tomorrow to set up start of care appointment.   Plan Will await return call if no response will plan return call in the next 4 business.   Joylene Draft, RN, BSN  [place] Management Coordinator  [phone]- Mobile [phone]- Toll Free Main Office

## 2021-01-22 ENCOUNTER — Telehealth: Payer: Self-pay | Admitting: *Deleted

## 2021-01-22 NOTE — Telephone Encounter (Signed)
Oral swab drug screen was consistent for prescribed medications.  ?

## 2021-01-27 ENCOUNTER — Other Ambulatory Visit: Payer: Self-pay | Admitting: *Deleted

## 2021-01-27 ENCOUNTER — Other Ambulatory Visit: Payer: Self-pay | Admitting: Vascular Surgery

## 2021-01-27 NOTE — Patient Outreach (Signed)
[facility]) Care Management  01/27/2021  Hannah [name] Feb 21, 1966 [id number]   Telephone Assessment    Referral Date : 620/22 Referral source: [facility] hospital liasion Referral reason : Complex care and disease management follow up call, assess for further needs. Insurance: [employer]   2 ED visits in the last month Hospital admission - [name] 8/29-8/30/22 Alerted mental status . Positive Covid 19 around  8/2 and positive test 01/12/21.   Subjective Successful outreach call to patient daughter Hannah [name], she reports that patient is beginning to feel better . She voiced being pleased that patient is currently engaged with home health physical /Occupational therapy and currently that are with her in home for a visit today. She is encouraged by patient progress and starting to feel better after recovering from Covid.  She reports over the last 3 days patient pain in left arm has gone from 10 down to 0,  taking methadone as prescribed. .  She reports patient with relief of constipation and improved appetite and food intake.    Goals Addressed             This Visit's Progress    Follow My Treatment Plan-Chronic Kidney   On track    Timeframe:  Long-Range Goal Priority:  Medium Start Date:    11/04/20                         Expected End Date:  03/16/21                   Follow Up Date 02/11/21 Barriers: Knowledge    - keep follow-up appointments - keep taking my medicines, even when I feel good    Why is this important?   Staying as healthy as you can is very important. This may mean making changes if you smoke, don't exercise or eat poorly.  A healthy lifestyle is an important goal for you.  Following the treatment plan and making changes may be hard.  Try some of these steps to help keep the disease from getting worse.     Notes:  Consistent with treatment sessions, schedule change to MWF 12/31/20 Acknowledging attending all HD session,family  providing transportation instead of use of [facility] for now due to patient recent covid .  7/27 reports Hemodialysis treatment going great, attending all sessions  Attending Hemodialysis treatments as scheduled.      Improve My Nutrition with the Foods I Eat and Drink   On track    Timeframe:  Long-Range Goal Priority:  Medium Start Date:    11/04/20                        Expected End Date:02/13/21                    Follow Up Date : 01/2821   - choose small, frequent meals - include protein food, like egg, meat, chicken, peanut butter, beans and fish in each meal    Why is this important?   Eating 3 healthy meals, or 5 or 6 small meals, a day is very important.  A healthy diet is one that has fruit, vegetables, protein, good fats, whole grains and low-fat dairy products.  Be sure to have plenty of protein that builds strong muscles. Many older people do not get quite enough protein.     Barriers: Knowledge   Notes:  9/13 Improved  appetite and nutrition.  8/17 verified improvement with appetite and intake, family assisting with meal prep and variety of foods understanding importance of nutrition. 7/27 patient acknowledged drinking nutrition supplement at HD session even though she does not like, reinforced importance of nutrition .  Patient acknowledges improved appetite and intake, fluid restriction limits.      Make and Keep All Appointments   On track    Timeframe:  Long-Range Goal Priority:  Medium Start Date:  11/04/20                          Expected End Date:  03/16/21                     Follow Up Date : 01/2821   - keep a calendar with appointment dates    Why is this important?   Part of staying healthy is seeing the doctor for follow-up care.  If you forget your appointments, there are some things you can do to stay on track.  Barriers: None  Notes: Family assisting with keeping track of appointments, discussed upcoming follow up with vascular on 9/14 and PCP on  9/19. 8/17 reviewed upcoming appointment with vascular MD, and Rehab . Encouraged PCP follow up after ED visit for weakness due to covid 19. Patient agrees has improved some since then but still weak.  7/27 Commended for scheduling and attending post discharge visit with PCP. Reinforced follow up with vascular MD regarding pain from recent procedure right arm  Reviewed upcoming appointment with Vascular clinic provided contact number and date/time of appointment . Discussed transportation with [facility] or family member available. Daughter to schedule post dc visit with PCP .      Monitor and Manage My Blood Sugar-Diabetes Type 2   On track    Timeframe:  Long-Range Goal Priority:  Medium Start Date:   11/04/20                          Expected End Date: 01/13/21                   Follow Up Date 02/11/21 Barriers: Health Behaviors Knowledge    - check blood sugar at prescribed times - check blood sugar if I feel it is too high or too low - take the blood sugar log to all doctor visits - take the blood sugar meter to all doctor visits    Why is this important?   Checking your blood sugar at home helps to keep it from getting very high or very low.  Writing the results in a diary or log helps the doctor know how to care for you.  Your blood sugar log should have the time, date and the results.  Also, write down the amount of insulin or other medicine that you take.  Other information, like what you ate, exercise done and how you were feeling, will also be helpful.     Notes:  01/27/21 Patient continues to monitor blood sugars with freestyle device, this morning reading 138 and average reading has been 150's but occasional 240.  12/31/20 Patient checking blood sugar with device , reading this am 160, family assist with insulin therapy. Denies recent low blood sugar readings.  7/27 , Denies recent hypoglycemia , Goal restart : no recent hypoglycemia episodes, checking readings at least 4 times  daily with freestyle libre and verifies with  finger sticks. Readings in 120-170.     Set My Target A1C-Diabetes Type 2   On track    Timeframe:  Long-Range Goal Priority:  Medium Start Date:  11/04/20                           Expected End Date:  03/16/21                   Follow Up Date 02/11/21   - set target A1C    Why is this important?   Your target A1C is decided together by you and your doctor.  It is based on several things like your age and other health issues.   Barriers: Knowledge  Goal restart  Discussed recent A1c reading of 8.7%  reviewed usual control range of 7% , patient to discuss with PCP at visit next week. Patient daughter attending visit , taking meter.  Note: 7/27 recent Alc at PCP visit 7.3% improved from 8.7% on 08/21/20 Reviewed goal A1c and her recent A1c, encouraged to discuss with provider at visit            Plan Patient /family agreeable with plan and follow up in the next 2 weeks. Will send PCP visit note for quarterly update.    Joylene Draft, RN, BSN  [place] Management Coordinator  [phone]- Mobile [phone]- Toll Free Main Office

## 2021-01-28 ENCOUNTER — Ambulatory Visit (HOSPITAL_COMMUNITY)
Admission: RE | Admit: 2021-01-28 | Discharge: 2021-01-28 | Disposition: A | Discharge status: Home / Self Care | Payer: Medicare Other | Source: Ambulatory Visit | Attending: Physician Assistant | Admitting: Physician Assistant

## 2021-01-28 ENCOUNTER — Ambulatory Visit (INDEPENDENT_AMBULATORY_CARE_PROVIDER_SITE_OTHER): Payer: Medicare Other | Admitting: Physician Assistant

## 2021-01-28 ENCOUNTER — Other Ambulatory Visit: Payer: Self-pay

## 2021-01-28 ENCOUNTER — Encounter (HOSPITAL_COMMUNITY): Payer: Medicare Other

## 2021-01-28 VITALS — BP 140/71 | HR 66 | Temp 97.4°F | Resp 14 | Ht 62.0 in | Wt 115.0 lb

## 2021-01-28 DIAGNOSIS — N186 End stage renal disease: Secondary | ICD-10-CM | POA: Insufficient documentation

## 2021-01-28 DIAGNOSIS — Z992 Dependence on renal dialysis: Secondary | ICD-10-CM

## 2021-01-28 MED ORDER — HYDROXYZINE HCL 10 MG PO TABS: 10.0000 mg | ORAL_TABLET | Freq: Three times a day (TID) | ORAL | 1 refills | Status: DC | PRN

## 2021-01-28 NOTE — Progress Notes (Signed)
POST OPERATIVE OFFICE NOTE    CC:  F/u for surgery  HPI:  This is a 55 y.o. female who is s/p right UE second stage basilic fistula  on [id number] by Dr. Carlis Abbott.    Pt returns today for follow up.  Pt states Still having nerve pain in the right UE.  She is being followed by pain management Dr. Dagoberto Ligas.    Allergies  Allergen Reactions   Ibuprofen Swelling    Swelling to hands, feet and face    Influenza Vaccines Anaphylaxis and Swelling    fever   Pneumococcal Vaccine Anaphylaxis and Swelling    fever   Promethazine Other (See Comments)    hallucinations   Ambien [Zolpidem] Other (See Comments)    Unknown reaction   Cyclobenzaprine Other (See Comments)    hallucinations    Tape Itching   Tramadol Hcl Other (See Comments)    hallucinations   Oxycodone Nausea And Vomiting    Current Outpatient Medications  Medication Sig Dispense Refill   amLODipine (NORVASC) 10 MG tablet Take 1 tablet (10 mg total) by mouth daily. 30 tablet 0   atorvastatin (LIPITOR) 80 MG tablet Take 1 tablet (80 mg total) by mouth at bedtime. 30 tablet 0   calcium acetate (PHOSLO) 667 MG capsule Take 667 mg by mouth See admin instructions. Take one capsule (667 mg) by mouth  up to 3 times daily - with each meal and snacks     carvedilol (COREG) 25 MG tablet Take 1 tablet (25 mg total) by mouth 2 (two) times daily with a meal. 60 tablet 0   clopidogrel (PLAVIX) 75 MG tablet Take 1 tablet (75 mg total) by mouth daily. 30 tablet 0   Continuous Blood Gluc Sensor (FREESTYLE LIBRE 2 SENSOR) MISC USE AS DIRECTED CHANGE EVERY 14 DAYS     dantrolene (DANTRIUM) 100 MG capsule Take 1 capsule (100 mg total) by mouth 2 (two) times daily. 60 capsule 5   diclofenac Sodium (VOLTAREN) 1 % GEL Apply 2 g topically 4 (four) times daily. To leg (Patient taking differently: Apply 2 g topically 4 (four) times daily as needed (pain). To leg) 350 g 0   famotidine (PEPCID) 20 MG tablet Take 1 tablet (20 mg total) by mouth daily. 30  tablet 0   hydrALAZINE (APRESOLINE) 50 MG tablet Take 1 tablet (50 mg total) by mouth every 8 (eight) hours. (Patient taking differently: Take 50 mg by mouth See admin instructions. Take one tablet (50 mg) by mouth every 8 hours on Sunday, Tuesday, Thursday, Saturday (non-dialysis days), take one tablet (50 mg) twice daily on Monday, Wednesday, Friday (dialysis days) - after dialysis and at bedtime) 90 tablet 0   insulin degludec (TRESIBA FLEXTOUCH) 100 UNIT/ML FlexTouch Pen Inject 12 Units into the skin at bedtime. (Patient taking differently: Inject 8-12 Units into the skin at bedtime. Sliding scale) 15 mL 0   insulin lispro (HUMALOG) 100 UNIT/ML KwikPen Inject 5 Units into the skin 3 (three) times daily. (Patient taking differently: Inject 4-6 Units into the skin See admin instructions. Inject 4-6 units subcutaneously up to three times daily before meals) 15 mL 0   melatonin 5 MG TABS Take 5 mg by mouth at bedtime.     nitroGLYCERIN (NITROSTAT) 0.4 MG SL tablet Place 1 tablet (0.4 mg total) under the tongue every 5 (five) minutes as needed for chest pain. 30 tablet 12   ondansetron (ZOFRAN) 4 MG tablet Take 4-8 mg by mouth 3 (three) times daily  as needed for nausea or vomiting.     polyethylene glycol (MIRALAX / GLYCOLAX) 17 g packet Take 17 g by mouth daily. (Patient taking differently: Take 17 g by mouth daily as needed for moderate constipation.) 30 each 0   senna-docusate (SENOKOT-S) 8.6-50 MG tablet Take 1 tablet by mouth 2 (two) times daily as needed for moderate constipation. 60 tablet 0   tiZANidine (ZANAFLEX) 2 MG tablet Take 1 tablet (2 mg total) by mouth at bedtime. (Patient taking differently: Take 2 mg by mouth 3 (three) times daily.) 30 tablet 0   No current facility-administered medications for this visit.     ROS:  See HPI  Physical Exam:      Findings:  +--------------------+----------+-----------------+--------+  AVF                 PSV (cm/s)Flow Vol (mL/min)Comments   +--------------------+----------+-----------------+--------+  Native artery inflow   163           440                 +--------------------+----------+-----------------+--------+  AVF Anastomosis        327                               +--------------------+----------+-----------------+--------+      +------------+----------+-------------+----------+------------------+  OUTFLOW VEINPSV (cm/s)Diameter (cm)Depth (cm)     Describe       +------------+----------+-------------+----------+------------------+  Prox UA        642        0.13        0.46   change in Diameter  +------------+----------+-------------+----------+------------------+  Mid UA         111        0.55        0.29                       +------------+----------+-------------+----------+------------------+  Dist UA        256        0.46        0.30     Retained valve    +------------+----------+-------------+----------+------------------+  AC Fossa       349        0.35        0.92                       +------------+----------+-------------+----------+------------------+          Summary:  Patent arteriovenous fistula with a change in vessel diameter in the  proximal  upper arm segment with a significant change in velocities.  . Retained valve in the distal upper arm segment.   Incision:  Well healed Extremities:  The fistula has a pulsatile thrill, radial pulse is palpable, sensation intact      Assessment/Plan:  This is a 55 y.o. female who is s/p:Second stage basilic fistula The duplex demonstrates central venous stenosis proximally.  I discussed the duplex and exam with Dr. Donzetta Matters today in clinic.  We will schedule;e her for fistulogram with possible intervention to increase out flow of the fistula.    She has chronic nerve pain and is in pain management with Dr. Abbott Pao.  I refilled her Hydroxyzine today for itching caused by her pain medication.  She will need Op f/u to  continue this medication for itching.    -   Roxy Horseman PA-C [facility] [phone]  Clinic MD:  Donzetta Matters

## 2021-01-30 ENCOUNTER — Other Ambulatory Visit (HOSPITAL_COMMUNITY): Payer: Self-pay

## 2021-02-04 ENCOUNTER — Telehealth: Payer: Self-pay

## 2021-02-04 NOTE — Telephone Encounter (Signed)
Daughter for refill of Methadone. Called daughter because Methadone not on med list however it is on PMP filled on 01/09/21 by Dr. Dagoberto Ligas and also in 01/09/21 note-Methadone- 5 mg 2x/day- for nerve pain/10/10 pain- can try 2.5 mg 2x/day but don't go higher than 5 mg 2x/day until I increase it. However pt thought she was having an allergic reaction to it. January 21, 2021 Courtney Heys, MD to [name]     11:18 AM As we discussed: Try methadone 2.5 mg (1/2 tab) 1x/day - don't give with other meds and make sure she eats before taking Don't take percocet Take Miralax 1x/day and senokot 1 tab 2x/day- can increase to max of Miralax 2x/day and Senokot 2 tabs 2x/day.    HAS to take bowel meds daly for constipation.    Let me know how it goes- don't take pain meds from anyone but me, please due to opiate contract that we did.   Can you address in Dr. Florentina Jenny absence?

## 2021-02-05 MED ORDER — METHADONE HCL 5 MG PO TABS: 2.5000 mg | ORAL_TABLET | Freq: Two times a day (BID) | ORAL | 0 refills | Status: DC

## 2021-02-05 NOTE — Telephone Encounter (Signed)
Rx sent in

## 2021-02-05 NOTE — Telephone Encounter (Signed)
Patient is taking Methadone 5 mg 1/2 tablet twice a day. [facility]

## 2021-02-11 ENCOUNTER — Other Ambulatory Visit: Payer: Self-pay | Admitting: *Deleted

## 2021-02-11 NOTE — Patient Outreach (Signed)
[facility]) Care Management  02/11/2021  Gloris Barndt 26-Feb-1966 [id number]   Telephone Assessment   Subjective: Unsuccessful outreach call to patient , no answer able to leave a HIPAA compliant voicemail message for return call.   Plan If no return call will plan follow up call in the next week.    Joylene Draft, RN, BSN  [place] Management Coordinator  [phone]- Mobile [phone]- Toll Free Main Office

## 2021-02-12 ENCOUNTER — Ambulatory Visit (HOSPITAL_COMMUNITY)
Admission: RE | Admit: 2021-02-12 | Discharge: 2021-02-12 | Disposition: A | Discharge status: Home / Self Care | Payer: Medicare Other | Attending: Vascular Surgery | Admitting: Vascular Surgery

## 2021-02-12 ENCOUNTER — Other Ambulatory Visit: Payer: Self-pay

## 2021-02-12 ENCOUNTER — Encounter (HOSPITAL_COMMUNITY)
Admission: RE | Disposition: A | Discharge status: Home / Self Care | Payer: Self-pay | Source: Home / Self Care | Attending: Vascular Surgery

## 2021-02-12 ENCOUNTER — Encounter (HOSPITAL_COMMUNITY): Payer: Self-pay | Admitting: Vascular Surgery

## 2021-02-12 DIAGNOSIS — Z791 Long term (current) use of non-steroidal anti-inflammatories (NSAID): Secondary | ICD-10-CM | POA: Insufficient documentation

## 2021-02-12 DIAGNOSIS — Z885 Allergy status to narcotic agent status: Secondary | ICD-10-CM | POA: Diagnosis not present

## 2021-02-12 DIAGNOSIS — Z7902 Long term (current) use of antithrombotics/antiplatelets: Secondary | ICD-10-CM | POA: Diagnosis not present

## 2021-02-12 DIAGNOSIS — Z79899 Other long term (current) drug therapy: Secondary | ICD-10-CM | POA: Diagnosis not present

## 2021-02-12 DIAGNOSIS — T82858A Stenosis of vascular prosthetic devices, implants and grafts, initial encounter: Secondary | ICD-10-CM | POA: Insufficient documentation

## 2021-02-12 DIAGNOSIS — Z886 Allergy status to analgesic agent status: Secondary | ICD-10-CM | POA: Insufficient documentation

## 2021-02-12 DIAGNOSIS — Z888 Allergy status to other drugs, medicaments and biological substances status: Secondary | ICD-10-CM | POA: Insufficient documentation

## 2021-02-12 DIAGNOSIS — G8929 Other chronic pain: Secondary | ICD-10-CM | POA: Insufficient documentation

## 2021-02-12 DIAGNOSIS — Y832 Surgical operation with anastomosis, bypass or graft as the cause of abnormal reaction of the patient, or of later complication, without mention of misadventure at the time of the procedure: Secondary | ICD-10-CM | POA: Insufficient documentation

## 2021-02-12 DIAGNOSIS — Z887 Allergy status to serum and vaccine status: Secondary | ICD-10-CM | POA: Insufficient documentation

## 2021-02-12 DIAGNOSIS — Z794 Long term (current) use of insulin: Secondary | ICD-10-CM | POA: Insufficient documentation

## 2021-02-12 DIAGNOSIS — T82590A Other mechanical complication of surgically created arteriovenous fistula, initial encounter: Secondary | ICD-10-CM

## 2021-02-12 HISTORY — PX: A/V FISTULAGRAM: CATH118298

## 2021-02-12 HISTORY — PX: PERIPHERAL VASCULAR BALLOON ANGIOPLASTY: CATH118281

## 2021-02-12 LAB — POCT I-STAT, CHEM 8
BUN: 4 mg/dL — ABNORMAL LOW (ref 6–20)
Calcium, Ion: 1.2 mmol/L (ref 1.15–1.40)
Chloride: 96 mmol/L — ABNORMAL LOW (ref 98–111)
Creatinine, Ser: 2.8 mg/dL — ABNORMAL HIGH (ref 0.44–1.00)
Glucose, Bld: 110 mg/dL — ABNORMAL HIGH (ref 70–99)
HCT: 36 % (ref 36.0–46.0)
Hemoglobin: 12.2 g/dL (ref 12.0–15.0)
Potassium: 4 mmol/L (ref 3.5–5.1)
Sodium: 139 mmol/L (ref 135–145)
TCO2: 33 mmol/L — ABNORMAL HIGH (ref 22–32)

## 2021-02-12 SURGERY — A/V FISTULAGRAM
Anesthesia: LOCAL | Laterality: Right

## 2021-02-12 MED ORDER — SODIUM CHLORIDE 0.9% FLUSH: 3.0000 mL | Freq: Two times a day (BID) | INTRAVENOUS | Status: DC

## 2021-02-12 MED ORDER — HEPARIN (PORCINE) IN NACL 1000-0.9 UT/500ML-% IV SOLN
INTRAVENOUS | Status: AC
  Filled 2021-02-12: qty 500

## 2021-02-12 MED ORDER — LIDOCAINE HCL (PF) 1 % IJ SOLN
INTRAMUSCULAR | Status: DC | PRN
  Administered 2021-02-12: 2 mL

## 2021-02-12 MED ORDER — HEPARIN (PORCINE) IN NACL 1000-0.9 UT/500ML-% IV SOLN
INTRAVENOUS | Status: DC | PRN
  Administered 2021-02-12: 500 mL

## 2021-02-12 MED ORDER — SODIUM CHLORIDE 0.9% FLUSH: 3.0000 mL | INTRAVENOUS | Status: DC | PRN

## 2021-02-12 MED ORDER — IODIXANOL 320 MG/ML IV SOLN
INTRAVENOUS | Status: DC | PRN
  Administered 2021-02-12: 30 mL

## 2021-02-12 MED ORDER — SODIUM CHLORIDE 0.9 % IV SOLN: 250.0000 mL | INTRAVENOUS | Status: DC | PRN

## 2021-02-12 MED ORDER — LIDOCAINE HCL (PF) 1 % IJ SOLN
INTRAMUSCULAR | Status: AC
  Filled 2021-02-12: qty 30

## 2021-02-12 SURGICAL SUPPLY — 14 items
BAG SNAP BAND KOVER 36X36 (MISCELLANEOUS) ×2 IMPLANT
BALLN MUSTANG 8X60X75 (BALLOONS) ×2
BALLOON MUSTANG 8X60X75 (BALLOONS) ×1 IMPLANT
COVER DOME SNAP 22 D (MISCELLANEOUS) ×2 IMPLANT
KIT ENCORE 26 ADVANTAGE (KITS) ×2 IMPLANT
KIT MICROPUNCTURE NIT STIFF (SHEATH) ×2 IMPLANT
PROTECTION STATION PRESSURIZED (MISCELLANEOUS) ×2
SHEATH PINNACLE R/O II 6F 4CM (SHEATH) ×2 IMPLANT
SHEATH PROBE COVER 6X72 (BAG) ×2 IMPLANT
STATION PROTECTION PRESSURIZED (MISCELLANEOUS) ×1 IMPLANT
STOPCOCK MORSE 400PSI 3WAY (MISCELLANEOUS) ×4 IMPLANT
TRAY PV CATH (CUSTOM PROCEDURE TRAY) ×2 IMPLANT
TUBING CIL FLEX 10 FLL-RA (TUBING) ×2 IMPLANT
WIRE STARTER BENTSON 035X150 (WIRE) ×2 IMPLANT

## 2021-02-12 NOTE — Op Note (Signed)
    OPERATIVE NOTE   PROCEDURE: right brachiobasilic arteriovenous fistula cannulation under ultrasound guidance right arm fistulogram including central venogram right peripheral angioplasty of basilic vein (8 mm x 60 mm Mustang)   PRE-OPERATIVE DIAGNOSIS: Malfunctioning right arteriovenous fistula  POST-OPERATIVE DIAGNOSIS: same as above   SURGEON: Marty Heck, MD  Assistant: Macie Burows, MD  ANESTHESIA: local  ESTIMATED BLOOD LOSS: 5 cc  FINDING(S): No evidence of central stenosis.  The mid to proximal upper arm basilic vein had a focal 80 to 90% stenosis.  This lesion was crossed and treated with a 8 mm x 60 mm Mustang to nominal pressure for 2 minutes with no residual stenosis.  On reflux imaging the vein does appear small near the arterial anastomosis without any focal stenosis.  Excellent thrill at completion.  SPECIMEN(S):  None  CONTRAST: 30 mL  INDICATIONS: Camron Doland is a 55 y.o. female who  presents with malfunctioning right brachiobasilic arteriovenous fistula.  The patient is scheduled for right arm fistulogram.  The patient is aware the risks include but are not limited to: bleeding, infection, thrombosis of the cannulated access, and possible anaphylactic reaction to the contrast.  The patient is aware of the risks of the procedure and elects to proceed forward.  DESCRIPTION: After full informed written consent was obtained, the patient was brought back to the angiography suite and placed supine upon the angiography table.  The patient was connected to monitoring equipment.  The right arm was prepped and draped in the standard fashion for a right arm fistulogram.  Under ultrasound guidance, the right arteriovenous fistula was evaluated, it was patent, an image was saved.  It was cannulated with a micropuncture needle.  The microwire was advanced into the fistula and the needle was exchanged for the a microsheath, which was lodged 2 cm into the access.   The wire was removed and the sheath was connected to the IV extension tubing.  Hand injections were completed to image the access from the antecubitum up to the level of axilla.  The central venous structures were also imaged by hand injections.  We elected to treat the 80 to 90% stenosis in the mid to proximal upper arm basilic vein.  Then used a Bentson wire to cross the stenosis and exchanged for a short 6 [place] sheath.  The lesion was then angioplastied with an 8 mm x 60 mm Mustang to nominal pressure for 2 minutes.  Reflux shot was obtained.  The vein is small near the artery anastomosis but without a focal stenosis.  After angioplasty no residual stenosis.  A 4-0 Monocryl purse-string suture was sewn around the sheath.  The sheath was removed while tying down the suture.  A sterile bandage was applied to the puncture site.  COMPLICATIONS: None  CONDITION: Stable  Marty Heck, MD [facility] Office: [place]   02/12/2021 10:05 AM

## 2021-02-12 NOTE — H&P (Signed)
History and Physical Interval Note:  02/12/2021 8:34 AM  Hannah [name]  has presented today for surgery, with the diagnosis of central venous stenosis.  The various methods of treatment have been discussed with the patient and family. After consideration of risks, benefits and other options for treatment, the patient has consented to  Procedure(s): A/V FISTULAGRAM (Right) as a surgical intervention.  The patient's history has been reviewed, patient examined, no change in status, stable for surgery.  I have reviewed the patient's chart and labs.  Questions were answered to the patient's satisfaction.     Marty Heck  POST OPERATIVE OFFICE NOTE       CC:  F/u for surgery   HPI:  This is a 55 y.o. female who is s/p right UE second stage basilic fistula  on Q000111Q by Dr. Carlis Abbott.     Pt returns today for follow up.  Pt states Still having nerve pain in the right UE.  She is being followed by pain management Dr. Dagoberto Ligas.          Allergies  Allergen Reactions   Ibuprofen Swelling      Swelling to hands, feet and face     Influenza Vaccines Anaphylaxis and Swelling      fever   Pneumococcal Vaccine Anaphylaxis and Swelling      fever   Promethazine Other (See Comments)      hallucinations   Ambien [Zolpidem] Other (See Comments)      Unknown reaction   Cyclobenzaprine Other (See Comments)      hallucinations     Tape Itching   Tramadol Hcl Other (See Comments)      hallucinations   Oxycodone Nausea And Vomiting            Current Outpatient Medications  Medication Sig Dispense Refill   amLODipine (NORVASC) 10 MG tablet Take 1 tablet (10 mg total) by mouth daily. 30 tablet 0   atorvastatin (LIPITOR) 80 MG tablet Take 1 tablet (80 mg total) by mouth at bedtime. 30 tablet 0   calcium acetate (PHOSLO) 667 MG capsule Take 667 mg by mouth See admin instructions. Take one capsule (667 mg) by mouth  up to 3 times daily - with each meal and snacks       carvedilol (COREG) 25 MG  tablet Take 1 tablet (25 mg total) by mouth 2 (two) times daily with a meal. 60 tablet 0   clopidogrel (PLAVIX) 75 MG tablet Take 1 tablet (75 mg total) by mouth daily. 30 tablet 0   Continuous Blood Gluc Sensor (FREESTYLE LIBRE 2 SENSOR) MISC USE AS DIRECTED CHANGE EVERY 14 DAYS       dantrolene (DANTRIUM) 100 MG capsule Take 1 capsule (100 mg total) by mouth 2 (two) times daily. 60 capsule 5   diclofenac Sodium (VOLTAREN) 1 % GEL Apply 2 g topically 4 (four) times daily. To leg (Patient taking differently: Apply 2 g topically 4 (four) times daily as needed (pain). To leg) 350 g 0   famotidine (PEPCID) 20 MG tablet Take 1 tablet (20 mg total) by mouth daily. 30 tablet 0   hydrALAZINE (APRESOLINE) 50 MG tablet Take 1 tablet (50 mg total) by mouth every 8 (eight) hours. (Patient taking differently: Take 50 mg by mouth See admin instructions. Take one tablet (50 mg) by mouth every 8 hours on Sunday, Tuesday, Thursday, Saturday (non-dialysis days), take one tablet (50 mg) twice daily on Monday, Wednesday, Friday (dialysis days) - after dialysis and  at bedtime) 90 tablet 0   insulin degludec (TRESIBA FLEXTOUCH) 100 UNIT/ML FlexTouch Pen Inject 12 Units into the skin at bedtime. (Patient taking differently: Inject 8-12 Units into the skin at bedtime. Sliding scale) 15 mL 0   insulin lispro (HUMALOG) 100 UNIT/ML KwikPen Inject 5 Units into the skin 3 (three) times daily. (Patient taking differently: Inject 4-6 Units into the skin See admin instructions. Inject 4-6 units subcutaneously up to three times daily before meals) 15 mL 0   melatonin 5 MG TABS Take 5 mg by mouth at bedtime.       nitroGLYCERIN (NITROSTAT) 0.4 MG SL tablet Place 1 tablet (0.4 mg total) under the tongue every 5 (five) minutes as needed for chest pain. 30 tablet 12   ondansetron (ZOFRAN) 4 MG tablet Take 4-8 mg by mouth 3 (three) times daily as needed for nausea or vomiting.       polyethylene glycol (MIRALAX / GLYCOLAX) 17 g packet Take  17 g by mouth daily. (Patient taking differently: Take 17 g by mouth daily as needed for moderate constipation.) 30 each 0   senna-docusate (SENOKOT-S) 8.6-50 MG tablet Take 1 tablet by mouth 2 (two) times daily as needed for moderate constipation. 60 tablet 0   tiZANidine (ZANAFLEX) 2 MG tablet Take 1 tablet (2 mg total) by mouth at bedtime. (Patient taking differently: Take 2 mg by mouth 3 (three) times daily.) 30 tablet 0    No current facility-administered medications for this visit.       ROS:  See HPI   Physical Exam:       Findings:  +--------------------+----------+-----------------+--------+  AVF                 PSV (cm/s)Flow Vol (mL/min)Comments  +--------------------+----------+-----------------+--------+  Native artery inflow   163           440                 +--------------------+----------+-----------------+--------+  AVF Anastomosis        327                               +--------------------+----------+-----------------+--------+      +------------+----------+-------------+----------+------------------+  OUTFLOW VEINPSV (cm/s)Diameter (cm)Depth (cm)     Describe       +------------+----------+-------------+----------+------------------+  Prox UA        642        0.13        0.46   change in Diameter  +------------+----------+-------------+----------+------------------+  Mid UA         111        0.55        0.29                       +------------+----------+-------------+----------+------------------+  Dist UA        256        0.46        0.30     Retained valve    +------------+----------+-------------+----------+------------------+  AC Fossa       349        0.35        0.92                       +------------+----------+-------------+----------+------------------+          Summary:  Patent arteriovenous fistula with a change in vessel diameter in the  proximal  upper  arm segment with a significant change  in velocities.  . Retained valve in the distal upper arm segment.    Incision:  Well healed Extremities:  The fistula has a pulsatile thrill, radial pulse is palpable, sensation intact          Assessment/Plan:  This is a 55 y.o. female who is s/p:Second stage basilic fistula The duplex demonstrates central venous stenosis proximally.  I discussed the duplex and exam with Dr. Donzetta Matters today in clinic.  We will schedule;e her for fistulogram with possible intervention to increase out flow of the fistula.               She has chronic nerve pain and is in pain management with Dr. Abbott Pao.  I refilled her Hydroxyzine today for itching caused by her pain medication.  She will need Op f/u to continue this medication for itching.     -     Roxy Horseman PA-C [facility] [phone]     Clinic MD:  Donzetta Matters

## 2021-02-16 ENCOUNTER — Inpatient Hospital Stay: Payer: Medicare Other | Admitting: Neurology

## 2021-02-17 ENCOUNTER — Other Ambulatory Visit: Payer: Self-pay | Admitting: *Deleted

## 2021-02-17 NOTE — Patient Outreach (Signed)
[facility]) Care Management  02/17/2021  Hannah [name] [birth year] [id number]   Telephone Assessment    Subjective: Unsuccessful outreach call to patient , no answer able to leave a HIPAA compliant voicemail message for return call.  Placed outreach call to patient daughter Hannah [name], Designated party release no answer able to leave a HIPAA compliant voicemail message for return call.    Plan If no return call will plan follow up call in the next week. Noted patient no longer has  [facility] banner , no showing in [facility] as [facility] , will follow [facility] CMA to verify patient status for services.   Joylene Draft, RN, BSN  [place] Management Coordinator  [phone]- Mobile [phone]- Toll Free Main Office

## 2021-02-23 ENCOUNTER — Other Ambulatory Visit: Payer: Self-pay

## 2021-02-24 ENCOUNTER — Telehealth: Payer: Self-pay | Admitting: *Deleted

## 2021-02-24 ENCOUNTER — Other Ambulatory Visit: Payer: Self-pay | Admitting: *Deleted

## 2021-02-24 NOTE — Patient Outreach (Signed)
[facility]) Care Management  02/24/2021  Hannah [name] 10-03-1965 [id number]   Telephone Assessment    #3 Unsuccessful outreach call.  Subjective: Unsuccessful outreach call to patient , no answer able to leave a HIPAA compliant voicemail message for return call.  Placed outreach call to patient daughter Hannah [name], Designated party release no answer able to leave a HIPAA compliant voicemail message for return call.    Plan Will plan return call in the next week if no response And will plan case closure as patient , no longer listed as [facility] eligible patient.   Joylene Draft, RN, BSN  [place] Management Coordinator  [phone]- Mobile [phone]- Toll Free Main Office

## 2021-02-24 NOTE — Telephone Encounter (Signed)
Mrs Crayton's daughter called to ask about the methadone refill. (See Mychart request).

## 2021-02-24 NOTE — Telephone Encounter (Signed)
I think I last filled her methadone because Dr. Dagoberto Ligas was on PAL? I haven't seen this lady since I did her first H and P in January. Dr. Dagoberto Ligas follows her.

## 2021-02-25 MED ORDER — METHADONE HCL 5 MG PO TABS: 2.5000 mg | ORAL_TABLET | Freq: Two times a day (BID) | ORAL | 0 refills | Status: DC

## 2021-03-03 ENCOUNTER — Other Ambulatory Visit: Payer: Self-pay | Admitting: *Deleted

## 2021-03-03 NOTE — Patient Outreach (Signed)
[place] [facility]) Care Management  03/03/2021  Hannah [name] 27-Jul-1965 [id number]   Telephone Assessment  Case Closure     #4 Unsuccessful outreach call.  Subjective: Unsuccessful outreach call to patient , no answer able to leave a HIPAA compliant voicemail message for return call.  Placed outreach call to patient daughter Teretha Ilg, Designated party release no answer able to leave a HIPAA compliant voicemail message for return call.   Plan Noted patient no longer has  [facility] banner , not showing in [employer] [facility] as [facility], no longer eligible for [facility] care management services. Will close case to [facility] care management . Will send patient and provider notification letter .    Joylene Draft, RN, BSN  [place] Management Coordinator  [phone]- Mobile [phone]- Toll Free Main Office

## 2021-03-23 ENCOUNTER — Encounter
Payer: Medicare Other | Attending: Physical Medicine and Rehabilitation | Admitting: Physical Medicine and Rehabilitation

## 2021-03-23 DIAGNOSIS — G8928 Other chronic postprocedural pain: Secondary | ICD-10-CM | POA: Insufficient documentation

## 2021-03-23 DIAGNOSIS — Z5181 Encounter for therapeutic drug level monitoring: Secondary | ICD-10-CM | POA: Insufficient documentation

## 2021-03-23 DIAGNOSIS — N186 End stage renal disease: Secondary | ICD-10-CM | POA: Insufficient documentation

## 2021-03-23 DIAGNOSIS — I679 Cerebrovascular disease, unspecified: Secondary | ICD-10-CM | POA: Insufficient documentation

## 2021-03-23 DIAGNOSIS — G8110 Spastic hemiplegia affecting unspecified side: Secondary | ICD-10-CM | POA: Insufficient documentation

## 2021-03-23 DIAGNOSIS — R5381 Other malaise: Secondary | ICD-10-CM | POA: Insufficient documentation

## 2021-03-23 DIAGNOSIS — Z992 Dependence on renal dialysis: Secondary | ICD-10-CM | POA: Insufficient documentation

## 2021-03-23 DIAGNOSIS — Z79899 Other long term (current) drug therapy: Secondary | ICD-10-CM | POA: Insufficient documentation

## 2021-03-23 DIAGNOSIS — G894 Chronic pain syndrome: Secondary | ICD-10-CM | POA: Insufficient documentation

## 2021-03-26 ENCOUNTER — Other Ambulatory Visit: Payer: Self-pay | Admitting: Physician Assistant

## 2021-03-26 DIAGNOSIS — Z992 Dependence on renal dialysis: Secondary | ICD-10-CM

## 2021-04-16 ENCOUNTER — Inpatient Hospital Stay: Payer: Medicare Other | Admitting: Neurology

## 2021-05-18 DIAGNOSIS — N186 End stage renal disease: Secondary | ICD-10-CM | POA: Diagnosis not present

## 2021-05-18 DIAGNOSIS — N2581 Secondary hyperparathyroidism of renal origin: Secondary | ICD-10-CM | POA: Diagnosis not present

## 2021-05-18 DIAGNOSIS — Z992 Dependence on renal dialysis: Secondary | ICD-10-CM | POA: Diagnosis not present

## 2021-05-20 DIAGNOSIS — N186 End stage renal disease: Secondary | ICD-10-CM | POA: Diagnosis not present

## 2021-05-20 DIAGNOSIS — N2581 Secondary hyperparathyroidism of renal origin: Secondary | ICD-10-CM | POA: Diagnosis not present

## 2021-05-20 DIAGNOSIS — Z992 Dependence on renal dialysis: Secondary | ICD-10-CM | POA: Diagnosis not present

## 2021-05-22 DIAGNOSIS — N186 End stage renal disease: Secondary | ICD-10-CM | POA: Diagnosis not present

## 2021-05-22 DIAGNOSIS — Z992 Dependence on renal dialysis: Secondary | ICD-10-CM | POA: Diagnosis not present

## 2021-05-22 DIAGNOSIS — N2581 Secondary hyperparathyroidism of renal origin: Secondary | ICD-10-CM | POA: Diagnosis not present

## 2021-05-25 DIAGNOSIS — F4321 Adjustment disorder with depressed mood: Secondary | ICD-10-CM | POA: Diagnosis not present

## 2021-05-25 DIAGNOSIS — N2581 Secondary hyperparathyroidism of renal origin: Secondary | ICD-10-CM | POA: Diagnosis not present

## 2021-05-25 DIAGNOSIS — G2581 Restless legs syndrome: Secondary | ICD-10-CM | POA: Diagnosis not present

## 2021-05-25 DIAGNOSIS — Z8673 Personal history of transient ischemic attack (TIA), and cerebral infarction without residual deficits: Secondary | ICD-10-CM | POA: Diagnosis not present

## 2021-05-25 DIAGNOSIS — N186 End stage renal disease: Secondary | ICD-10-CM | POA: Diagnosis not present

## 2021-05-25 DIAGNOSIS — D631 Anemia in chronic kidney disease: Secondary | ICD-10-CM | POA: Diagnosis not present

## 2021-05-25 DIAGNOSIS — I10 Essential (primary) hypertension: Secondary | ICD-10-CM | POA: Diagnosis not present

## 2021-05-25 DIAGNOSIS — E1169 Type 2 diabetes mellitus with other specified complication: Secondary | ICD-10-CM | POA: Diagnosis not present

## 2021-05-25 DIAGNOSIS — Z992 Dependence on renal dialysis: Secondary | ICD-10-CM | POA: Diagnosis not present

## 2021-05-25 DIAGNOSIS — R1013 Epigastric pain: Secondary | ICD-10-CM | POA: Diagnosis not present

## 2021-05-25 DIAGNOSIS — N185 Chronic kidney disease, stage 5: Secondary | ICD-10-CM | POA: Diagnosis not present

## 2021-05-27 DIAGNOSIS — Z992 Dependence on renal dialysis: Secondary | ICD-10-CM | POA: Diagnosis not present

## 2021-05-27 DIAGNOSIS — N186 End stage renal disease: Secondary | ICD-10-CM | POA: Diagnosis not present

## 2021-05-27 DIAGNOSIS — N2581 Secondary hyperparathyroidism of renal origin: Secondary | ICD-10-CM | POA: Diagnosis not present

## 2021-05-29 DIAGNOSIS — N2581 Secondary hyperparathyroidism of renal origin: Secondary | ICD-10-CM | POA: Diagnosis not present

## 2021-05-29 DIAGNOSIS — Z992 Dependence on renal dialysis: Secondary | ICD-10-CM | POA: Diagnosis not present

## 2021-05-29 DIAGNOSIS — N186 End stage renal disease: Secondary | ICD-10-CM | POA: Diagnosis not present

## 2021-06-01 DIAGNOSIS — N2581 Secondary hyperparathyroidism of renal origin: Secondary | ICD-10-CM | POA: Diagnosis not present

## 2021-06-01 DIAGNOSIS — Z992 Dependence on renal dialysis: Secondary | ICD-10-CM | POA: Diagnosis not present

## 2021-06-01 DIAGNOSIS — N186 End stage renal disease: Secondary | ICD-10-CM | POA: Diagnosis not present

## 2021-06-02 DIAGNOSIS — Z992 Dependence on renal dialysis: Secondary | ICD-10-CM | POA: Diagnosis not present

## 2021-06-02 DIAGNOSIS — N186 End stage renal disease: Secondary | ICD-10-CM | POA: Diagnosis not present

## 2021-06-02 DIAGNOSIS — Z452 Encounter for adjustment and management of vascular access device: Secondary | ICD-10-CM | POA: Diagnosis not present

## 2021-06-03 DIAGNOSIS — N2581 Secondary hyperparathyroidism of renal origin: Secondary | ICD-10-CM | POA: Diagnosis not present

## 2021-06-03 DIAGNOSIS — N186 End stage renal disease: Secondary | ICD-10-CM | POA: Diagnosis not present

## 2021-06-03 DIAGNOSIS — Z992 Dependence on renal dialysis: Secondary | ICD-10-CM | POA: Diagnosis not present

## 2021-06-05 DIAGNOSIS — N2581 Secondary hyperparathyroidism of renal origin: Secondary | ICD-10-CM | POA: Diagnosis not present

## 2021-06-05 DIAGNOSIS — Z992 Dependence on renal dialysis: Secondary | ICD-10-CM | POA: Diagnosis not present

## 2021-06-05 DIAGNOSIS — N186 End stage renal disease: Secondary | ICD-10-CM | POA: Diagnosis not present

## 2021-06-08 DIAGNOSIS — N2581 Secondary hyperparathyroidism of renal origin: Secondary | ICD-10-CM | POA: Diagnosis not present

## 2021-06-08 DIAGNOSIS — N186 End stage renal disease: Secondary | ICD-10-CM | POA: Diagnosis not present

## 2021-06-08 DIAGNOSIS — Z992 Dependence on renal dialysis: Secondary | ICD-10-CM | POA: Diagnosis not present

## 2021-06-09 DIAGNOSIS — K5909 Other constipation: Secondary | ICD-10-CM | POA: Insufficient documentation

## 2021-06-09 DIAGNOSIS — R1032 Left lower quadrant pain: Secondary | ICD-10-CM | POA: Diagnosis not present

## 2021-06-09 DIAGNOSIS — Z1211 Encounter for screening for malignant neoplasm of colon: Secondary | ICD-10-CM | POA: Diagnosis not present

## 2021-06-10 DIAGNOSIS — N2581 Secondary hyperparathyroidism of renal origin: Secondary | ICD-10-CM | POA: Diagnosis not present

## 2021-06-10 DIAGNOSIS — Z992 Dependence on renal dialysis: Secondary | ICD-10-CM | POA: Diagnosis not present

## 2021-06-10 DIAGNOSIS — N186 End stage renal disease: Secondary | ICD-10-CM | POA: Diagnosis not present

## 2021-06-12 DIAGNOSIS — Z992 Dependence on renal dialysis: Secondary | ICD-10-CM | POA: Diagnosis not present

## 2021-06-12 DIAGNOSIS — N186 End stage renal disease: Secondary | ICD-10-CM | POA: Diagnosis not present

## 2021-06-12 DIAGNOSIS — N2581 Secondary hyperparathyroidism of renal origin: Secondary | ICD-10-CM | POA: Diagnosis not present

## 2021-06-15 DIAGNOSIS — I700 Atherosclerosis of aorta: Secondary | ICD-10-CM | POA: Diagnosis not present

## 2021-06-15 DIAGNOSIS — R918 Other nonspecific abnormal finding of lung field: Secondary | ICD-10-CM | POA: Diagnosis not present

## 2021-06-15 DIAGNOSIS — K5909 Other constipation: Secondary | ICD-10-CM | POA: Diagnosis not present

## 2021-06-15 DIAGNOSIS — Z992 Dependence on renal dialysis: Secondary | ICD-10-CM | POA: Diagnosis not present

## 2021-06-15 DIAGNOSIS — N2581 Secondary hyperparathyroidism of renal origin: Secondary | ICD-10-CM | POA: Diagnosis not present

## 2021-06-15 DIAGNOSIS — R1032 Left lower quadrant pain: Secondary | ICD-10-CM | POA: Diagnosis not present

## 2021-06-15 DIAGNOSIS — N186 End stage renal disease: Secondary | ICD-10-CM | POA: Diagnosis not present

## 2021-06-15 DIAGNOSIS — K5900 Constipation, unspecified: Secondary | ICD-10-CM | POA: Diagnosis not present

## 2021-06-15 DIAGNOSIS — R109 Unspecified abdominal pain: Secondary | ICD-10-CM | POA: Diagnosis not present

## 2021-06-16 DIAGNOSIS — Z992 Dependence on renal dialysis: Secondary | ICD-10-CM | POA: Diagnosis not present

## 2021-06-16 DIAGNOSIS — N186 End stage renal disease: Secondary | ICD-10-CM | POA: Diagnosis not present

## 2021-06-16 DIAGNOSIS — E1022 Type 1 diabetes mellitus with diabetic chronic kidney disease: Secondary | ICD-10-CM | POA: Diagnosis not present

## 2021-06-17 DIAGNOSIS — N186 End stage renal disease: Secondary | ICD-10-CM | POA: Diagnosis not present

## 2021-06-17 DIAGNOSIS — N2581 Secondary hyperparathyroidism of renal origin: Secondary | ICD-10-CM | POA: Diagnosis not present

## 2021-06-17 DIAGNOSIS — Z992 Dependence on renal dialysis: Secondary | ICD-10-CM | POA: Diagnosis not present

## 2021-06-19 DIAGNOSIS — N186 End stage renal disease: Secondary | ICD-10-CM | POA: Diagnosis not present

## 2021-06-19 DIAGNOSIS — N2581 Secondary hyperparathyroidism of renal origin: Secondary | ICD-10-CM | POA: Diagnosis not present

## 2021-06-19 DIAGNOSIS — Z992 Dependence on renal dialysis: Secondary | ICD-10-CM | POA: Diagnosis not present

## 2021-06-22 DIAGNOSIS — Z992 Dependence on renal dialysis: Secondary | ICD-10-CM | POA: Diagnosis not present

## 2021-06-22 DIAGNOSIS — N186 End stage renal disease: Secondary | ICD-10-CM | POA: Diagnosis not present

## 2021-06-22 DIAGNOSIS — N2581 Secondary hyperparathyroidism of renal origin: Secondary | ICD-10-CM | POA: Diagnosis not present

## 2021-06-24 DIAGNOSIS — N186 End stage renal disease: Secondary | ICD-10-CM | POA: Diagnosis not present

## 2021-06-24 DIAGNOSIS — Z992 Dependence on renal dialysis: Secondary | ICD-10-CM | POA: Diagnosis not present

## 2021-06-24 DIAGNOSIS — N2581 Secondary hyperparathyroidism of renal origin: Secondary | ICD-10-CM | POA: Diagnosis not present

## 2021-06-26 DIAGNOSIS — N2581 Secondary hyperparathyroidism of renal origin: Secondary | ICD-10-CM | POA: Diagnosis not present

## 2021-06-26 DIAGNOSIS — N186 End stage renal disease: Secondary | ICD-10-CM | POA: Diagnosis not present

## 2021-06-26 DIAGNOSIS — Z992 Dependence on renal dialysis: Secondary | ICD-10-CM | POA: Diagnosis not present

## 2021-06-29 DIAGNOSIS — Z992 Dependence on renal dialysis: Secondary | ICD-10-CM | POA: Diagnosis not present

## 2021-06-29 DIAGNOSIS — N186 End stage renal disease: Secondary | ICD-10-CM | POA: Diagnosis not present

## 2021-06-29 DIAGNOSIS — N2581 Secondary hyperparathyroidism of renal origin: Secondary | ICD-10-CM | POA: Diagnosis not present

## 2021-07-01 ENCOUNTER — Emergency Department (HOSPITAL_COMMUNITY)
Admission: EM | Admit: 2021-07-01 | Discharge: 2021-07-01 | Disposition: A | Discharge status: Home / Self Care | Payer: Medicare HMO | Attending: Student | Admitting: Student

## 2021-07-01 ENCOUNTER — Encounter (HOSPITAL_COMMUNITY): Payer: Self-pay | Admitting: Emergency Medicine

## 2021-07-01 ENCOUNTER — Other Ambulatory Visit: Payer: Self-pay

## 2021-07-01 DIAGNOSIS — N186 End stage renal disease: Secondary | ICD-10-CM | POA: Insufficient documentation

## 2021-07-01 DIAGNOSIS — I2510 Atherosclerotic heart disease of native coronary artery without angina pectoris: Secondary | ICD-10-CM | POA: Insufficient documentation

## 2021-07-01 DIAGNOSIS — Z992 Dependence on renal dialysis: Secondary | ICD-10-CM | POA: Diagnosis not present

## 2021-07-01 DIAGNOSIS — E1122 Type 2 diabetes mellitus with diabetic chronic kidney disease: Secondary | ICD-10-CM | POA: Insufficient documentation

## 2021-07-01 DIAGNOSIS — Z794 Long term (current) use of insulin: Secondary | ICD-10-CM | POA: Insufficient documentation

## 2021-07-01 DIAGNOSIS — T82590A Other mechanical complication of surgically created arteriovenous fistula, initial encounter: Secondary | ICD-10-CM | POA: Insufficient documentation

## 2021-07-01 DIAGNOSIS — I120 Hypertensive chronic kidney disease with stage 5 chronic kidney disease or end stage renal disease: Secondary | ICD-10-CM | POA: Diagnosis not present

## 2021-07-01 DIAGNOSIS — Z79899 Other long term (current) drug therapy: Secondary | ICD-10-CM | POA: Insufficient documentation

## 2021-07-01 DIAGNOSIS — T82590S Other mechanical complication of surgically created arteriovenous fistula, sequela: Secondary | ICD-10-CM

## 2021-07-01 DIAGNOSIS — Z7902 Long term (current) use of antithrombotics/antiplatelets: Secondary | ICD-10-CM | POA: Insufficient documentation

## 2021-07-01 DIAGNOSIS — R58 Hemorrhage, not elsewhere classified: Secondary | ICD-10-CM | POA: Diagnosis not present

## 2021-07-01 DIAGNOSIS — N2581 Secondary hyperparathyroidism of renal origin: Secondary | ICD-10-CM | POA: Diagnosis not present

## 2021-07-01 DIAGNOSIS — Z743 Need for continuous supervision: Secondary | ICD-10-CM | POA: Diagnosis not present

## 2021-07-01 LAB — CBC WITH DIFFERENTIAL/PLATELET
Abs Immature Granulocytes: 0.03 10*3/uL (ref 0.00–0.07)
Basophils Absolute: 0 10*3/uL (ref 0.0–0.1)
Basophils Relative: 1 %
Eosinophils Absolute: 0.1 10*3/uL (ref 0.0–0.5)
Eosinophils Relative: 2 %
HCT: 34.1 % — ABNORMAL LOW (ref 36.0–46.0)
Hemoglobin: 10.8 g/dL — ABNORMAL LOW (ref 12.0–15.0)
Immature Granulocytes: 1 %
Lymphocytes Relative: 21 %
Lymphs Abs: 1.1 10*3/uL (ref 0.7–4.0)
MCH: 29.8 pg (ref 26.0–34.0)
MCHC: 31.7 g/dL (ref 30.0–36.0)
MCV: 93.9 fL (ref 80.0–100.0)
Monocytes Absolute: 0.5 10*3/uL (ref 0.1–1.0)
Monocytes Relative: 9 %
Neutro Abs: 3.7 10*3/uL (ref 1.7–7.7)
Neutrophils Relative %: 66 %
Platelets: 211 10*3/uL (ref 150–400)
RBC: 3.63 MIL/uL — ABNORMAL LOW (ref 3.87–5.11)
RDW: 17.2 % — ABNORMAL HIGH (ref 11.5–15.5)
WBC: 5.4 10*3/uL (ref 4.0–10.5)
nRBC: 0 % (ref 0.0–0.2)

## 2021-07-01 LAB — PROTIME-INR
INR: 1 (ref 0.8–1.2)
Prothrombin Time: 13 seconds (ref 11.4–15.2)

## 2021-07-01 LAB — BASIC METABOLIC PANEL
Anion gap: 11 (ref 5–15)
BUN: 7 mg/dL (ref 6–20)
CO2: 30 mmol/L (ref 22–32)
Calcium: 8.7 mg/dL — ABNORMAL LOW (ref 8.9–10.3)
Chloride: 98 mmol/L (ref 98–111)
Creatinine, Ser: 2.27 mg/dL — ABNORMAL HIGH (ref 0.44–1.00)
GFR, Estimated: 25 mL/min — ABNORMAL LOW (ref >60)
Glucose, Bld: 164 mg/dL — ABNORMAL HIGH (ref 70–99)
Potassium: 3.3 mmol/L — ABNORMAL LOW (ref 3.5–5.1)
Sodium: 139 mmol/L (ref 135–145)

## 2021-07-01 NOTE — Discharge Instructions (Signed)
You were seen in the emergency department for evaluation of bleeding from your AV fistula site.  This has been well controlled and is currently no longer bleeding.  Your blood counts are normal for your known baseline and you do not need a blood transfusion today.  At this time you are safe for discharge, please follow-up with your primary care physician.  Return the emergency department if you have persistent bleeding or any other concerning symptoms.

## 2021-07-01 NOTE — ED Triage Notes (Signed)
Patient BIB [facility] for bleeding dialysis access on right arm. Arrives with bandage from EMS, bleeding has stopped en route but resumed on arrival to hospital. Pulse present distal to EMS dressing. Patient reports pain in right arm, is alert and oriented at this time.

## 2021-07-01 NOTE — ED Provider Triage Note (Signed)
Emergency Medicine Provider Triage Evaluation Note  Hannah [name] , [name] 56 y.o. female  was evaluated in triage.  Pt complains of bleeding to right graft while at dialysis. Stopped with pressure by EMS. No numbness, weakness  Review of Systems  Positive: Bleeding right graft Negative:   Physical Exam  There were no vitals taken for this visit. Gen:   Awake, no distress   Resp:  Normal effort  MSK:   Moves extremities without difficulty, graft right upper extremity, dried blood, no active bleed Other:    Medical Decision Making  Medically screening exam initiated at 1:46 PM.  Appropriate orders placed.  Hannah [name] was informed that the remainder of the evaluation will be completed by another provider, this initial triage assessment does not replace that evaluation, and the importance of remaining in the ED until their evaluation is complete.  Bleeding graft>> was able to undress wrap placed by EMS, no active bleeding.  Dressing replaced.  We will check labs, patient neurovascularly intact. Will obs in triage to make sure bleeding does not reoccur    Hannah [name] A, PA-C 07/01/21 1353

## 2021-07-01 NOTE — ED Provider Notes (Signed)
[facility] EMERGENCY DEPARTMENT Provider Note  CSN: [id number] Arrival date & time: 07/01/21 1342  Chief Complaint(s) Vascular Access Problem  HPI Hannah [name] is a 56 y.o. female who presents emergency department for evaluation of an AV fistula bleed.  Patient states that she completed dialysis today and at home noticed that her AV fistula was starting to bleed more.  Daughter states that the patient's AV fistula was bleeding for approximately 20 minutes and was "pouring".  Compressive bandage applied by physician assistant here in the lobby.  Patient denies chest pain, shortness of breath, abdominal pain, nausea, vomiting or other systemic symptoms.  HPI  Past Medical History Past Medical History:  Diagnosis Date   Cerebral infarction ([place]) 07/12/2017   2009 first one, has had a total of 4 stokes   Chronic kidney disease    tues thurs sat - dialysis   Diabetes type 2, controlled ([place])    Hemiparesis affecting right side as late effect of cerebrovascular accident (CVA) ([place])    Hyperlipidemia    Hypertension    Multiple sclerosis ([place]) 2009   Stroke [facility])    Walker as ambulation aid    Patient Active Problem List   Diagnosis Date Noted   Altered mental status, unspecified 01/12/2021   Other chronic postprocedural pain 01/09/2021   Past heart attack 11/13/2020   Spastic hemiplegia due to cerebrovascular disease ([place]) 11/03/2020   Debility 10/18/2020   ESRD on hemodialysis (HCC)    SOB (shortness of breath) 09/27/2020   Volume overload 09/26/2020   CKD (chronic kidney disease) stage 5, GFR less than 15 ml/min ([place]) 09/13/2020   H/O: CVA (cerebrovascular accident) 09/12/2020   Acute blood loss anemia    Chronic kidney disease (CKD), stage IV (severe) ([place])    Uncontrolled type 2 diabetes mellitus with hyperglycemia ([place])    Essential hypertension    Right pontine cerebrovascular accident ([place]) 06/12/2020   Stroke-like symptoms 06/09/2020   Acute kidney  injury superimposed on CKD ([place]) 06/09/2020   Cerebral thrombosis with cerebral infarction 06/09/2020   Hypertension    Hyperlipidemia    Diabetes type 2, controlled ([place])    Bimalleolar ankle fracture 11/24/2018   Lower abdominal pain 07/28/2017   Bladder mass 07/18/2017   Painless hematuria 07/18/2017   Intractable vomiting with nausea 07/12/2017   Hemiparesis affecting right side as late effect of cerebrovascular accident (CVA) ([place]) 09/22/2016   AKI (acute kidney injury) ([place]) 01/08/2016   CAD (coronary artery disease) 01/07/2016   Multiple sclerosis ([place]) 2009   Home Medication(s) Prior to Admission medications   Medication Sig Start Date End Date Taking? Authorizing Provider  methadone (DOLOPHINE) 5 MG tablet Take 0.5 tablets (2.5 mg total) by mouth every 12 (twelve) hours. 02/25/21   Lovorn, Jinny Blossom, MD  amLODipine (NORVASC) 10 MG tablet Take 1 tablet (10 mg total) by mouth daily. 11/03/20   Love, Ivan Anchors, PA-C  calcium acetate (PHOSLO) 667 MG capsule Take 667 mg by mouth in the morning, at noon, and at bedtime. 11/11/20   [provider]  carvedilol (COREG) 25 MG tablet Take 1 tablet (25 mg total) by mouth 2 (two) times daily with a meal. 11/03/20   Love, Ivan Anchors, PA-C  clopidogrel (PLAVIX) 75 MG tablet Take 1 tablet (75 mg total) by mouth daily. 11/03/20   Love, Ivan Anchors, PA-C  Continuous Blood Gluc Sensor (FREESTYLE LIBRE 2 SENSOR) MISC USE AS DIRECTED CHANGE EVERY 14 DAYS 12/30/20   [provider]  dantrolene (DANTRIUM)  100 MG capsule Take 1 capsule (100 mg total) by mouth 2 (two) times daily. 01/09/21   Lovorn, Jinny Blossom, MD  diclofenac Sodium (VOLTAREN) 1 % GEL Apply 2 g topically 4 (four) times daily. To leg Patient taking differently: Apply 2 g topically 4 (four) times daily as needed (pain). To leg 11/03/20   Love, Ivan Anchors, PA-C  famotidine (PEPCID) 20 MG tablet Take 1 tablet (20 mg total) by mouth daily. 11/03/20   Love, Ivan Anchors, PA-C  hydrALAZINE (APRESOLINE) 50  MG tablet Take 1 tablet (50 mg total) by mouth every 8 (eight) hours. Patient taking differently: Take 50 mg by mouth See admin instructions. Take one tablet (50 mg) by mouth every 8 hours on Sunday, Tuesday, Thursday, Saturday (non-dialysis days), take one tablet (50 mg) twice daily on Monday, Wednesday, Friday (dialysis days) - after dialysis and at bedtime 11/03/20   Love, Ivan Anchors, PA-C  hydrOXYzine (ATARAX/VISTARIL) 10 MG tablet Take 1 tablet (10 mg total) by mouth 3 (three) times daily as needed. Patient taking differently: Take 10 mg by mouth in the morning and at bedtime. 01/28/21   Ulyses Amor, PA-C  insulin degludec (TRESIBA FLEXTOUCH) 100 UNIT/ML FlexTouch Pen Inject 12 Units into the skin at bedtime. Patient taking differently: Inject 4-6 Units into the skin at bedtime. Sliding scale 11/03/20   Love, Ivan Anchors, PA-C  insulin lispro (HUMALOG) 100 UNIT/ML KwikPen Inject 5 Units into the skin 3 (three) times daily. Patient taking differently: Inject 5 Units into the skin 3 (three) times daily before meals. 11/04/20   Love, Ivan Anchors, PA-C  nitroGLYCERIN (NITROSTAT) 0.4 MG SL tablet Place 1 tablet (0.4 mg total) under the tongue every 5 (five) minutes as needed for chest pain. 06/25/20   Angiulli, Lavon Paganini, PA-C  ondansetron (ZOFRAN) 4 MG tablet Take 4-8 mg by mouth 2 (two) times daily. 01/10/21   [provider]  polyethylene glycol (MIRALAX / GLYCOLAX) 17 g packet Take 17 g by mouth daily. Patient taking differently: Take 17 g by mouth daily as needed for moderate constipation. 11/04/20   Love, Ivan Anchors, PA-C  rosuvastatin (CRESTOR) 20 MG tablet Take 20 mg by mouth daily. 02/05/21   [provider]  senna-docusate (SENOKOT-S) 8.6-50 MG tablet Take 1 tablet by mouth 2 (two) times daily as needed for moderate constipation. Patient taking differently: Take 1 tablet by mouth 2 (two) times daily. 11/03/20   Love, Ivan Anchors, PA-C  tiZANidine (ZANAFLEX) 2 MG tablet Take 1 tablet (2 mg  total) by mouth at bedtime. Patient taking differently: Take 2 mg by mouth in the morning and at bedtime. 11/03/20   Bary Leriche, PA-C                                                                                                                                    Past Surgical History Past Surgical History:  Procedure Laterality Date  A/V FISTULAGRAM Right 02/12/2021   Procedure: A/V FISTULAGRAM;  Surgeon: Marty Heck, MD;  Location: [facility];  Service: Cardiovascular;  Laterality: Right;   APPENDECTOMY     AV FISTULA PLACEMENT Right 10/02/2020   Procedure: RIGHT ARTERIOVENOUS (AV) FISTULA CREATION;  Surgeon: Waynetta Sandy, MD;  Location: [facility];  Service: Vascular;  Laterality: Right;   [place] Right 11/24/2020   Procedure: SECOND STAGE RIGHT ARM [place];  Surgeon: Marty Heck, MD;  Location: [facility];  Service: Vascular;  Laterality: Right;   CYSTOSTOMY W/ BLADDER BIOPSY     IR FLUORO GUIDE CV LINE RIGHT  09/29/2020   IR US GUIDE VASC ACCESS RIGHT  09/29/2020   OVARIAN CYST SURGERY     PERIPHERAL VASCULAR BALLOON ANGIOPLASTY Right 02/12/2021   Procedure: PERIPHERAL VASCULAR BALLOON ANGIOPLASTY;  Surgeon: Marty Heck, MD;  Location: [facility];  Service: Cardiovascular;  Laterality: Right;  UPPER ARM FISTULA   stent placed in heart     Family History No family history on file.  Social History Social History   Tobacco Use   Smoking status: Never   Smokeless tobacco: Never  Vaping Use   Vaping Use: Never used  Substance Use Topics   Alcohol use: Not Currently   Drug use: Never   Allergies Ibuprofen, Influenza vaccines, Pneumococcal vaccine, Promethazine, Ambien [zolpidem], Cyclobenzaprine, Tape, Tramadol hcl, and Oxycodone  Review of Systems Review of Systems  All other systems reviewed and are negative.  Physical Exam Vital Signs  I have reviewed the triage vital signs BP 134/83  (BP Location: Left Arm)    Pulse 95    Temp 98.8 F (37.1 C) (Oral)    Resp 16    Ht 5\' 2"  (1.575 m)    Wt 48.1 kg    SpO2 100%    BMI 19.39 kg/m   Physical Exam Vitals and nursing note reviewed.  Constitutional:      General: She is not in acute distress.    Appearance: She is well-developed.  HENT:     Head: Normocephalic and atraumatic.  Eyes:     Conjunctiva/sclera: Conjunctivae normal.  Cardiovascular:     Rate and Rhythm: Normal rate and regular rhythm.     Heart sounds: No murmur heard. Pulmonary:     Effort: Pulmonary effort is normal. No respiratory distress.     Breath sounds: Normal breath sounds.  Abdominal:     Palpations: Abdomen is soft.     Tenderness: There is no abdominal tenderness.  Musculoskeletal:        General: No swelling.     Cervical back: Neck supple.     Comments: Bleeding controlled left upper extremity AV fistula  Skin:    General: Skin is warm and dry.     Capillary Refill: Capillary refill takes less than 2 seconds.  Neurological:     Mental Status: She is alert.  Psychiatric:        Mood and Affect: Mood normal.    ED Results and Treatments Labs (all labs ordered are listed, but only abnormal results are displayed) Labs Reviewed  CBC WITH DIFFERENTIAL/PLATELET - Abnormal; Notable for the following components:      Result Value   RBC 3.63 (*)    Hemoglobin 10.8 (*)    HCT 34.1 (*)    RDW 17.2 (*)    All other components within normal limits  BASIC METABOLIC PANEL - Abnormal; Notable for the following  components:   Potassium 3.3 (*)    Glucose, Bld 164 (*)    Creatinine, Ser 2.27 (*)    Calcium 8.7 (*)    GFR, Estimated 25 (*)    All other components within normal limits  PROTIME-INR                                                                                                                          Radiology No results found.  Pertinent labs & imaging results that were available during my care of the patient were reviewed  by me and considered in my medical decision making (see MDM for details).  Medications Ordered in ED Medications - No data to display                                                                                                                                   Procedures Procedures  (including critical care time)  Medical Decision Making / ED Course   This patient presents to the ED for concern of bleeding AV fistula, this involves an extensive number of treatment options, and is a complaint that carries with it a high risk of complications and morbidity.  The differential diagnosis includes AV fistula bleed, local irritation, coagulopathy  MDM: Patient seen emergency room for evaluation of an AV fistula bleed.  Physical exam reveals a left upper extremity brachial AV fistula with bleeding that is been controlled with a compressive bandage.  There is no evidence of persistent bleeding.  Hemoglobin 10.8 consistent with her underlying ESRD.  INR normal at 1, mild hypokalemia to 3.3, creatinine 2.27.  Compressive bandage replaced at the site of previous AV fistula bleeding and patient discharged with return precautions which she voiced understanding.   Additional history obtained: -Additional history obtained from daughter -External records from outside source obtained and reviewed including: Chart review including previous notes, labs, imaging, consultation notes   Lab Tests: -I ordered, reviewed, and interpreted labs.   The pertinent results include:   Labs Reviewed  CBC WITH DIFFERENTIAL/PLATELET - Abnormal; Notable for the following components:      Result Value   RBC 3.63 (*)    Hemoglobin 10.8 (*)    HCT 34.1 (*)    RDW 17.2 (*)    All other components within normal limits  BASIC METABOLIC PANEL - Abnormal; Notable for the following components:   Potassium 3.3 (*)  Glucose, Bld 164 (*)    Creatinine, Ser 2.27 (*)    Calcium 8.7 (*)    GFR, Estimated 25 (*)    All  other components within normal limits  PROTIME-INR      Medicines ordered and prescription drug management: No orders of the defined types were placed in this encounter.   -I have reviewed the patients home medicines and have made adjustments as needed  Critical interventions None  Social Determinants of Health:  Factors impacting patients care include: none   Reevaluation: After the interventions noted above, I reevaluated the patient and found that they have :improved  Co morbidities that complicate the patient evaluation  Past Medical History:  Diagnosis Date   Cerebral infarction ([employer]) 07/12/2017   2009 first one, has had a total of 4 stokes   Chronic kidney disease    tues thurs sat - dialysis   Diabetes type 2, controlled ([place])    Hemiparesis affecting right side as late effect of cerebrovascular accident (CVA) ([place])    Hyperlipidemia    Hypertension    Multiple sclerosis ([place]) 2009   Stroke [facility])    Walker as ambulation aid       Dispostion: I considered admission for this patient, but as her bleeding has resolved with compression only and she does not have a life-threatening [place] she is safe for discharge with outpatient follow-up.     Final Clinical Impression(s) / ED Diagnoses Final diagnoses:  Dialysis AV fistula malfunction, sequela     @PCDICTATION @    Teressa Lower, MD 07/01/21 1554

## 2021-07-01 NOTE — ED Notes (Signed)
Scrubs and socks provided for pt upon d/c

## 2021-07-01 NOTE — ED Notes (Signed)
Bleeding continues to be controlled at this time.

## 2021-07-03 DIAGNOSIS — N186 End stage renal disease: Secondary | ICD-10-CM | POA: Diagnosis not present

## 2021-07-03 DIAGNOSIS — N2581 Secondary hyperparathyroidism of renal origin: Secondary | ICD-10-CM | POA: Diagnosis not present

## 2021-07-03 DIAGNOSIS — Z992 Dependence on renal dialysis: Secondary | ICD-10-CM | POA: Diagnosis not present

## 2021-07-06 DIAGNOSIS — Z992 Dependence on renal dialysis: Secondary | ICD-10-CM | POA: Diagnosis not present

## 2021-07-06 DIAGNOSIS — N2581 Secondary hyperparathyroidism of renal origin: Secondary | ICD-10-CM | POA: Diagnosis not present

## 2021-07-06 DIAGNOSIS — N186 End stage renal disease: Secondary | ICD-10-CM | POA: Diagnosis not present

## 2021-07-07 DIAGNOSIS — N186 End stage renal disease: Secondary | ICD-10-CM | POA: Diagnosis not present

## 2021-07-07 DIAGNOSIS — I871 Compression of vein: Secondary | ICD-10-CM | POA: Diagnosis not present

## 2021-07-07 DIAGNOSIS — T82858A Stenosis of vascular prosthetic devices, implants and grafts, initial encounter: Secondary | ICD-10-CM | POA: Diagnosis not present

## 2021-07-07 DIAGNOSIS — Z992 Dependence on renal dialysis: Secondary | ICD-10-CM | POA: Diagnosis not present

## 2021-07-08 DIAGNOSIS — N186 End stage renal disease: Secondary | ICD-10-CM | POA: Diagnosis not present

## 2021-07-08 DIAGNOSIS — Z992 Dependence on renal dialysis: Secondary | ICD-10-CM | POA: Diagnosis not present

## 2021-07-08 DIAGNOSIS — N2581 Secondary hyperparathyroidism of renal origin: Secondary | ICD-10-CM | POA: Diagnosis not present

## 2021-07-10 DIAGNOSIS — N2581 Secondary hyperparathyroidism of renal origin: Secondary | ICD-10-CM | POA: Diagnosis not present

## 2021-07-10 DIAGNOSIS — Z992 Dependence on renal dialysis: Secondary | ICD-10-CM | POA: Diagnosis not present

## 2021-07-10 DIAGNOSIS — N186 End stage renal disease: Secondary | ICD-10-CM | POA: Diagnosis not present

## 2021-07-13 DIAGNOSIS — Z992 Dependence on renal dialysis: Secondary | ICD-10-CM | POA: Diagnosis not present

## 2021-07-13 DIAGNOSIS — N186 End stage renal disease: Secondary | ICD-10-CM | POA: Diagnosis not present

## 2021-07-13 DIAGNOSIS — N2581 Secondary hyperparathyroidism of renal origin: Secondary | ICD-10-CM | POA: Diagnosis not present

## 2021-07-14 DIAGNOSIS — Z992 Dependence on renal dialysis: Secondary | ICD-10-CM | POA: Diagnosis not present

## 2021-07-14 DIAGNOSIS — N186 End stage renal disease: Secondary | ICD-10-CM | POA: Diagnosis not present

## 2021-07-14 DIAGNOSIS — E1022 Type 1 diabetes mellitus with diabetic chronic kidney disease: Secondary | ICD-10-CM | POA: Diagnosis not present

## 2021-07-15 DIAGNOSIS — Z992 Dependence on renal dialysis: Secondary | ICD-10-CM | POA: Diagnosis not present

## 2021-07-15 DIAGNOSIS — N2581 Secondary hyperparathyroidism of renal origin: Secondary | ICD-10-CM | POA: Diagnosis not present

## 2021-07-15 DIAGNOSIS — N186 End stage renal disease: Secondary | ICD-10-CM | POA: Diagnosis not present

## 2021-07-17 DIAGNOSIS — Z992 Dependence on renal dialysis: Secondary | ICD-10-CM | POA: Diagnosis not present

## 2021-07-17 DIAGNOSIS — N2581 Secondary hyperparathyroidism of renal origin: Secondary | ICD-10-CM | POA: Diagnosis not present

## 2021-07-17 DIAGNOSIS — N186 End stage renal disease: Secondary | ICD-10-CM | POA: Diagnosis not present

## 2021-07-20 DIAGNOSIS — N2581 Secondary hyperparathyroidism of renal origin: Secondary | ICD-10-CM | POA: Diagnosis not present

## 2021-07-20 DIAGNOSIS — N186 End stage renal disease: Secondary | ICD-10-CM | POA: Diagnosis not present

## 2021-07-20 DIAGNOSIS — Z992 Dependence on renal dialysis: Secondary | ICD-10-CM | POA: Diagnosis not present

## 2021-07-22 DIAGNOSIS — Z992 Dependence on renal dialysis: Secondary | ICD-10-CM | POA: Diagnosis not present

## 2021-07-22 DIAGNOSIS — N2581 Secondary hyperparathyroidism of renal origin: Secondary | ICD-10-CM | POA: Diagnosis not present

## 2021-07-22 DIAGNOSIS — N186 End stage renal disease: Secondary | ICD-10-CM | POA: Diagnosis not present

## 2021-07-24 DIAGNOSIS — N2581 Secondary hyperparathyroidism of renal origin: Secondary | ICD-10-CM | POA: Diagnosis not present

## 2021-07-24 DIAGNOSIS — Z992 Dependence on renal dialysis: Secondary | ICD-10-CM | POA: Diagnosis not present

## 2021-07-24 DIAGNOSIS — N186 End stage renal disease: Secondary | ICD-10-CM | POA: Diagnosis not present

## 2021-07-29 DIAGNOSIS — N186 End stage renal disease: Secondary | ICD-10-CM | POA: Diagnosis not present

## 2021-07-29 DIAGNOSIS — N2581 Secondary hyperparathyroidism of renal origin: Secondary | ICD-10-CM | POA: Diagnosis not present

## 2021-07-29 DIAGNOSIS — Z992 Dependence on renal dialysis: Secondary | ICD-10-CM | POA: Diagnosis not present

## 2021-07-31 DIAGNOSIS — N2581 Secondary hyperparathyroidism of renal origin: Secondary | ICD-10-CM | POA: Diagnosis not present

## 2021-07-31 DIAGNOSIS — N186 End stage renal disease: Secondary | ICD-10-CM | POA: Diagnosis not present

## 2021-07-31 DIAGNOSIS — Z992 Dependence on renal dialysis: Secondary | ICD-10-CM | POA: Diagnosis not present

## 2021-08-03 DIAGNOSIS — N2581 Secondary hyperparathyroidism of renal origin: Secondary | ICD-10-CM | POA: Diagnosis not present

## 2021-08-03 DIAGNOSIS — N186 End stage renal disease: Secondary | ICD-10-CM | POA: Diagnosis not present

## 2021-08-03 DIAGNOSIS — Z992 Dependence on renal dialysis: Secondary | ICD-10-CM | POA: Diagnosis not present

## 2021-08-05 DIAGNOSIS — N2581 Secondary hyperparathyroidism of renal origin: Secondary | ICD-10-CM | POA: Diagnosis not present

## 2021-08-05 DIAGNOSIS — Z992 Dependence on renal dialysis: Secondary | ICD-10-CM | POA: Diagnosis not present

## 2021-08-05 DIAGNOSIS — N186 End stage renal disease: Secondary | ICD-10-CM | POA: Diagnosis not present

## 2021-08-07 DIAGNOSIS — N2581 Secondary hyperparathyroidism of renal origin: Secondary | ICD-10-CM | POA: Diagnosis not present

## 2021-08-07 DIAGNOSIS — N186 End stage renal disease: Secondary | ICD-10-CM | POA: Diagnosis not present

## 2021-08-07 DIAGNOSIS — Z992 Dependence on renal dialysis: Secondary | ICD-10-CM | POA: Diagnosis not present

## 2021-08-10 DIAGNOSIS — Z992 Dependence on renal dialysis: Secondary | ICD-10-CM | POA: Diagnosis not present

## 2021-08-10 DIAGNOSIS — N186 End stage renal disease: Secondary | ICD-10-CM | POA: Diagnosis not present

## 2021-08-10 DIAGNOSIS — N2581 Secondary hyperparathyroidism of renal origin: Secondary | ICD-10-CM | POA: Diagnosis not present

## 2021-08-13 DIAGNOSIS — I871 Compression of vein: Secondary | ICD-10-CM | POA: Diagnosis not present

## 2021-08-13 DIAGNOSIS — Z992 Dependence on renal dialysis: Secondary | ICD-10-CM | POA: Diagnosis not present

## 2021-08-13 DIAGNOSIS — N186 End stage renal disease: Secondary | ICD-10-CM | POA: Diagnosis not present

## 2021-08-13 DIAGNOSIS — T82858A Stenosis of vascular prosthetic devices, implants and grafts, initial encounter: Secondary | ICD-10-CM | POA: Diagnosis not present

## 2021-08-14 DIAGNOSIS — E1022 Type 1 diabetes mellitus with diabetic chronic kidney disease: Secondary | ICD-10-CM | POA: Diagnosis not present

## 2021-08-14 DIAGNOSIS — N186 End stage renal disease: Secondary | ICD-10-CM | POA: Diagnosis not present

## 2021-08-14 DIAGNOSIS — N2581 Secondary hyperparathyroidism of renal origin: Secondary | ICD-10-CM | POA: Diagnosis not present

## 2021-08-14 DIAGNOSIS — Z992 Dependence on renal dialysis: Secondary | ICD-10-CM | POA: Diagnosis not present

## 2021-08-17 DIAGNOSIS — Z992 Dependence on renal dialysis: Secondary | ICD-10-CM | POA: Diagnosis not present

## 2021-08-17 DIAGNOSIS — N2581 Secondary hyperparathyroidism of renal origin: Secondary | ICD-10-CM | POA: Diagnosis not present

## 2021-08-17 DIAGNOSIS — N186 End stage renal disease: Secondary | ICD-10-CM | POA: Diagnosis not present

## 2021-08-19 DIAGNOSIS — N2581 Secondary hyperparathyroidism of renal origin: Secondary | ICD-10-CM | POA: Diagnosis not present

## 2021-08-19 DIAGNOSIS — N186 End stage renal disease: Secondary | ICD-10-CM | POA: Diagnosis not present

## 2021-08-19 DIAGNOSIS — Z992 Dependence on renal dialysis: Secondary | ICD-10-CM | POA: Diagnosis not present

## 2021-08-24 DIAGNOSIS — N186 End stage renal disease: Secondary | ICD-10-CM | POA: Diagnosis not present

## 2021-08-24 DIAGNOSIS — Z992 Dependence on renal dialysis: Secondary | ICD-10-CM | POA: Diagnosis not present

## 2021-08-24 DIAGNOSIS — N2581 Secondary hyperparathyroidism of renal origin: Secondary | ICD-10-CM | POA: Diagnosis not present

## 2021-08-26 DIAGNOSIS — N186 End stage renal disease: Secondary | ICD-10-CM | POA: Diagnosis not present

## 2021-08-26 DIAGNOSIS — Z992 Dependence on renal dialysis: Secondary | ICD-10-CM | POA: Diagnosis not present

## 2021-08-26 DIAGNOSIS — N2581 Secondary hyperparathyroidism of renal origin: Secondary | ICD-10-CM | POA: Diagnosis not present

## 2021-08-28 DIAGNOSIS — N2581 Secondary hyperparathyroidism of renal origin: Secondary | ICD-10-CM | POA: Diagnosis not present

## 2021-08-28 DIAGNOSIS — Z992 Dependence on renal dialysis: Secondary | ICD-10-CM | POA: Diagnosis not present

## 2021-08-28 DIAGNOSIS — N186 End stage renal disease: Secondary | ICD-10-CM | POA: Diagnosis not present

## 2021-08-31 DIAGNOSIS — N186 End stage renal disease: Secondary | ICD-10-CM | POA: Diagnosis not present

## 2021-08-31 DIAGNOSIS — Z992 Dependence on renal dialysis: Secondary | ICD-10-CM | POA: Diagnosis not present

## 2021-08-31 DIAGNOSIS — N2581 Secondary hyperparathyroidism of renal origin: Secondary | ICD-10-CM | POA: Diagnosis not present

## 2021-09-02 DIAGNOSIS — N2581 Secondary hyperparathyroidism of renal origin: Secondary | ICD-10-CM | POA: Diagnosis not present

## 2021-09-02 DIAGNOSIS — Z992 Dependence on renal dialysis: Secondary | ICD-10-CM | POA: Diagnosis not present

## 2021-09-02 DIAGNOSIS — N186 End stage renal disease: Secondary | ICD-10-CM | POA: Diagnosis not present

## 2021-09-07 DIAGNOSIS — Z794 Long term (current) use of insulin: Secondary | ICD-10-CM | POA: Diagnosis not present

## 2021-09-07 DIAGNOSIS — N186 End stage renal disease: Secondary | ICD-10-CM | POA: Diagnosis not present

## 2021-09-07 DIAGNOSIS — N185 Chronic kidney disease, stage 5: Secondary | ICD-10-CM | POA: Diagnosis not present

## 2021-09-07 DIAGNOSIS — E785 Hyperlipidemia, unspecified: Secondary | ICD-10-CM | POA: Diagnosis not present

## 2021-09-07 DIAGNOSIS — E1165 Type 2 diabetes mellitus with hyperglycemia: Secondary | ICD-10-CM | POA: Diagnosis not present

## 2021-09-07 DIAGNOSIS — K2100 Gastro-esophageal reflux disease with esophagitis, without bleeding: Secondary | ICD-10-CM | POA: Diagnosis not present

## 2021-09-07 DIAGNOSIS — D638 Anemia in other chronic diseases classified elsewhere: Secondary | ICD-10-CM | POA: Diagnosis not present

## 2021-09-07 DIAGNOSIS — Z992 Dependence on renal dialysis: Secondary | ICD-10-CM | POA: Diagnosis not present

## 2021-09-07 DIAGNOSIS — I69351 Hemiplegia and hemiparesis following cerebral infarction affecting right dominant side: Secondary | ICD-10-CM | POA: Diagnosis not present

## 2021-09-07 DIAGNOSIS — Z79899 Other long term (current) drug therapy: Secondary | ICD-10-CM | POA: Diagnosis not present

## 2021-09-07 DIAGNOSIS — I10 Essential (primary) hypertension: Secondary | ICD-10-CM | POA: Diagnosis not present

## 2021-09-07 DIAGNOSIS — N2581 Secondary hyperparathyroidism of renal origin: Secondary | ICD-10-CM | POA: Diagnosis not present

## 2021-09-09 DIAGNOSIS — N186 End stage renal disease: Secondary | ICD-10-CM | POA: Diagnosis not present

## 2021-09-09 DIAGNOSIS — N2581 Secondary hyperparathyroidism of renal origin: Secondary | ICD-10-CM | POA: Diagnosis not present

## 2021-09-09 DIAGNOSIS — Z992 Dependence on renal dialysis: Secondary | ICD-10-CM | POA: Diagnosis not present

## 2021-09-11 DIAGNOSIS — Z992 Dependence on renal dialysis: Secondary | ICD-10-CM | POA: Diagnosis not present

## 2021-09-11 DIAGNOSIS — N2581 Secondary hyperparathyroidism of renal origin: Secondary | ICD-10-CM | POA: Diagnosis not present

## 2021-09-11 DIAGNOSIS — N186 End stage renal disease: Secondary | ICD-10-CM | POA: Diagnosis not present

## 2021-09-13 DIAGNOSIS — E1022 Type 1 diabetes mellitus with diabetic chronic kidney disease: Secondary | ICD-10-CM | POA: Diagnosis not present

## 2021-09-13 DIAGNOSIS — N186 End stage renal disease: Secondary | ICD-10-CM | POA: Diagnosis not present

## 2021-09-13 DIAGNOSIS — Z992 Dependence on renal dialysis: Secondary | ICD-10-CM | POA: Diagnosis not present

## 2021-09-14 DIAGNOSIS — N2581 Secondary hyperparathyroidism of renal origin: Secondary | ICD-10-CM | POA: Diagnosis not present

## 2021-09-14 DIAGNOSIS — N186 End stage renal disease: Secondary | ICD-10-CM | POA: Diagnosis not present

## 2021-09-14 DIAGNOSIS — Z992 Dependence on renal dialysis: Secondary | ICD-10-CM | POA: Diagnosis not present

## 2021-09-16 DIAGNOSIS — N2581 Secondary hyperparathyroidism of renal origin: Secondary | ICD-10-CM | POA: Diagnosis not present

## 2021-09-16 DIAGNOSIS — Z992 Dependence on renal dialysis: Secondary | ICD-10-CM | POA: Diagnosis not present

## 2021-09-16 DIAGNOSIS — N186 End stage renal disease: Secondary | ICD-10-CM | POA: Diagnosis not present

## 2021-09-18 DIAGNOSIS — N186 End stage renal disease: Secondary | ICD-10-CM | POA: Diagnosis not present

## 2021-09-18 DIAGNOSIS — N2581 Secondary hyperparathyroidism of renal origin: Secondary | ICD-10-CM | POA: Diagnosis not present

## 2021-09-18 DIAGNOSIS — Z992 Dependence on renal dialysis: Secondary | ICD-10-CM | POA: Diagnosis not present

## 2021-09-21 DIAGNOSIS — N186 End stage renal disease: Secondary | ICD-10-CM | POA: Diagnosis not present

## 2021-09-21 DIAGNOSIS — N2581 Secondary hyperparathyroidism of renal origin: Secondary | ICD-10-CM | POA: Diagnosis not present

## 2021-09-21 DIAGNOSIS — Z992 Dependence on renal dialysis: Secondary | ICD-10-CM | POA: Diagnosis not present

## 2021-09-23 DIAGNOSIS — N186 End stage renal disease: Secondary | ICD-10-CM | POA: Diagnosis not present

## 2021-09-23 DIAGNOSIS — N2581 Secondary hyperparathyroidism of renal origin: Secondary | ICD-10-CM | POA: Diagnosis not present

## 2021-09-23 DIAGNOSIS — Z992 Dependence on renal dialysis: Secondary | ICD-10-CM | POA: Diagnosis not present

## 2021-09-25 DIAGNOSIS — N186 End stage renal disease: Secondary | ICD-10-CM | POA: Diagnosis not present

## 2021-09-25 DIAGNOSIS — N2581 Secondary hyperparathyroidism of renal origin: Secondary | ICD-10-CM | POA: Diagnosis not present

## 2021-09-25 DIAGNOSIS — Z992 Dependence on renal dialysis: Secondary | ICD-10-CM | POA: Diagnosis not present

## 2021-09-28 DIAGNOSIS — N186 End stage renal disease: Secondary | ICD-10-CM | POA: Diagnosis not present

## 2021-09-28 DIAGNOSIS — N2581 Secondary hyperparathyroidism of renal origin: Secondary | ICD-10-CM | POA: Diagnosis not present

## 2021-09-28 DIAGNOSIS — Z992 Dependence on renal dialysis: Secondary | ICD-10-CM | POA: Diagnosis not present

## 2021-09-30 DIAGNOSIS — Z992 Dependence on renal dialysis: Secondary | ICD-10-CM | POA: Diagnosis not present

## 2021-09-30 DIAGNOSIS — N186 End stage renal disease: Secondary | ICD-10-CM | POA: Diagnosis not present

## 2021-09-30 DIAGNOSIS — N2581 Secondary hyperparathyroidism of renal origin: Secondary | ICD-10-CM | POA: Diagnosis not present

## 2021-10-01 DIAGNOSIS — N186 End stage renal disease: Secondary | ICD-10-CM | POA: Diagnosis not present

## 2021-10-01 DIAGNOSIS — T82858A Stenosis of vascular prosthetic devices, implants and grafts, initial encounter: Secondary | ICD-10-CM | POA: Diagnosis not present

## 2021-10-01 DIAGNOSIS — I871 Compression of vein: Secondary | ICD-10-CM | POA: Diagnosis not present

## 2021-10-01 DIAGNOSIS — Z992 Dependence on renal dialysis: Secondary | ICD-10-CM | POA: Diagnosis not present

## 2021-10-02 DIAGNOSIS — N2581 Secondary hyperparathyroidism of renal origin: Secondary | ICD-10-CM | POA: Diagnosis not present

## 2021-10-02 DIAGNOSIS — Z992 Dependence on renal dialysis: Secondary | ICD-10-CM | POA: Diagnosis not present

## 2021-10-02 DIAGNOSIS — N186 End stage renal disease: Secondary | ICD-10-CM | POA: Diagnosis not present

## 2021-10-05 DIAGNOSIS — N2581 Secondary hyperparathyroidism of renal origin: Secondary | ICD-10-CM | POA: Diagnosis not present

## 2021-10-05 DIAGNOSIS — Z992 Dependence on renal dialysis: Secondary | ICD-10-CM | POA: Diagnosis not present

## 2021-10-05 DIAGNOSIS — N186 End stage renal disease: Secondary | ICD-10-CM | POA: Diagnosis not present

## 2021-10-07 DIAGNOSIS — N2581 Secondary hyperparathyroidism of renal origin: Secondary | ICD-10-CM | POA: Diagnosis not present

## 2021-10-07 DIAGNOSIS — N186 End stage renal disease: Secondary | ICD-10-CM | POA: Diagnosis not present

## 2021-10-07 DIAGNOSIS — Z992 Dependence on renal dialysis: Secondary | ICD-10-CM | POA: Diagnosis not present

## 2021-10-09 DIAGNOSIS — N186 End stage renal disease: Secondary | ICD-10-CM | POA: Diagnosis not present

## 2021-10-09 DIAGNOSIS — Z992 Dependence on renal dialysis: Secondary | ICD-10-CM | POA: Diagnosis not present

## 2021-10-09 DIAGNOSIS — N2581 Secondary hyperparathyroidism of renal origin: Secondary | ICD-10-CM | POA: Diagnosis not present

## 2021-10-12 DIAGNOSIS — N2581 Secondary hyperparathyroidism of renal origin: Secondary | ICD-10-CM | POA: Diagnosis not present

## 2021-10-12 DIAGNOSIS — N186 End stage renal disease: Secondary | ICD-10-CM | POA: Diagnosis not present

## 2021-10-12 DIAGNOSIS — Z992 Dependence on renal dialysis: Secondary | ICD-10-CM | POA: Diagnosis not present

## 2021-10-14 DIAGNOSIS — N2581 Secondary hyperparathyroidism of renal origin: Secondary | ICD-10-CM | POA: Diagnosis not present

## 2021-10-14 DIAGNOSIS — E1022 Type 1 diabetes mellitus with diabetic chronic kidney disease: Secondary | ICD-10-CM | POA: Diagnosis not present

## 2021-10-14 DIAGNOSIS — Z992 Dependence on renal dialysis: Secondary | ICD-10-CM | POA: Diagnosis not present

## 2021-10-14 DIAGNOSIS — N186 End stage renal disease: Secondary | ICD-10-CM | POA: Diagnosis not present

## 2021-10-16 DIAGNOSIS — Z992 Dependence on renal dialysis: Secondary | ICD-10-CM | POA: Diagnosis not present

## 2021-10-16 DIAGNOSIS — N186 End stage renal disease: Secondary | ICD-10-CM | POA: Diagnosis not present

## 2021-10-16 DIAGNOSIS — N2581 Secondary hyperparathyroidism of renal origin: Secondary | ICD-10-CM | POA: Diagnosis not present

## 2021-10-19 DIAGNOSIS — N186 End stage renal disease: Secondary | ICD-10-CM | POA: Diagnosis not present

## 2021-10-19 DIAGNOSIS — Z992 Dependence on renal dialysis: Secondary | ICD-10-CM | POA: Diagnosis not present

## 2021-10-19 DIAGNOSIS — N2581 Secondary hyperparathyroidism of renal origin: Secondary | ICD-10-CM | POA: Diagnosis not present

## 2021-10-21 DIAGNOSIS — Z992 Dependence on renal dialysis: Secondary | ICD-10-CM | POA: Diagnosis not present

## 2021-10-21 DIAGNOSIS — N2581 Secondary hyperparathyroidism of renal origin: Secondary | ICD-10-CM | POA: Diagnosis not present

## 2021-10-21 DIAGNOSIS — N186 End stage renal disease: Secondary | ICD-10-CM | POA: Diagnosis not present

## 2021-10-23 DIAGNOSIS — N186 End stage renal disease: Secondary | ICD-10-CM | POA: Diagnosis not present

## 2021-10-23 DIAGNOSIS — Z992 Dependence on renal dialysis: Secondary | ICD-10-CM | POA: Diagnosis not present

## 2021-10-23 DIAGNOSIS — N2581 Secondary hyperparathyroidism of renal origin: Secondary | ICD-10-CM | POA: Diagnosis not present

## 2021-10-26 DIAGNOSIS — N2581 Secondary hyperparathyroidism of renal origin: Secondary | ICD-10-CM | POA: Diagnosis not present

## 2021-10-26 DIAGNOSIS — Z992 Dependence on renal dialysis: Secondary | ICD-10-CM | POA: Diagnosis not present

## 2021-10-26 DIAGNOSIS — N186 End stage renal disease: Secondary | ICD-10-CM | POA: Diagnosis not present

## 2021-10-30 DIAGNOSIS — Z992 Dependence on renal dialysis: Secondary | ICD-10-CM | POA: Diagnosis not present

## 2021-10-30 DIAGNOSIS — N2581 Secondary hyperparathyroidism of renal origin: Secondary | ICD-10-CM | POA: Diagnosis not present

## 2021-10-30 DIAGNOSIS — N186 End stage renal disease: Secondary | ICD-10-CM | POA: Diagnosis not present

## 2021-11-02 DIAGNOSIS — N2581 Secondary hyperparathyroidism of renal origin: Secondary | ICD-10-CM | POA: Diagnosis not present

## 2021-11-02 DIAGNOSIS — N186 End stage renal disease: Secondary | ICD-10-CM | POA: Diagnosis not present

## 2021-11-02 DIAGNOSIS — Z992 Dependence on renal dialysis: Secondary | ICD-10-CM | POA: Diagnosis not present

## 2021-11-04 DIAGNOSIS — N2581 Secondary hyperparathyroidism of renal origin: Secondary | ICD-10-CM | POA: Diagnosis not present

## 2021-11-04 DIAGNOSIS — Z992 Dependence on renal dialysis: Secondary | ICD-10-CM | POA: Diagnosis not present

## 2021-11-04 DIAGNOSIS — N186 End stage renal disease: Secondary | ICD-10-CM | POA: Diagnosis not present

## 2021-11-06 DIAGNOSIS — Z992 Dependence on renal dialysis: Secondary | ICD-10-CM | POA: Diagnosis not present

## 2021-11-06 DIAGNOSIS — N186 End stage renal disease: Secondary | ICD-10-CM | POA: Diagnosis not present

## 2021-11-06 DIAGNOSIS — N2581 Secondary hyperparathyroidism of renal origin: Secondary | ICD-10-CM | POA: Diagnosis not present

## 2021-11-09 DIAGNOSIS — N186 End stage renal disease: Secondary | ICD-10-CM | POA: Diagnosis not present

## 2021-11-09 DIAGNOSIS — Z992 Dependence on renal dialysis: Secondary | ICD-10-CM | POA: Diagnosis not present

## 2021-11-09 DIAGNOSIS — N2581 Secondary hyperparathyroidism of renal origin: Secondary | ICD-10-CM | POA: Diagnosis not present

## 2021-11-11 DIAGNOSIS — N186 End stage renal disease: Secondary | ICD-10-CM | POA: Diagnosis not present

## 2021-11-11 DIAGNOSIS — N2581 Secondary hyperparathyroidism of renal origin: Secondary | ICD-10-CM | POA: Diagnosis not present

## 2021-11-11 DIAGNOSIS — Z992 Dependence on renal dialysis: Secondary | ICD-10-CM | POA: Diagnosis not present

## 2021-11-13 DIAGNOSIS — E1022 Type 1 diabetes mellitus with diabetic chronic kidney disease: Secondary | ICD-10-CM | POA: Diagnosis not present

## 2021-11-13 DIAGNOSIS — N186 End stage renal disease: Secondary | ICD-10-CM | POA: Diagnosis not present

## 2021-11-13 DIAGNOSIS — N2581 Secondary hyperparathyroidism of renal origin: Secondary | ICD-10-CM | POA: Diagnosis not present

## 2021-11-13 DIAGNOSIS — Z992 Dependence on renal dialysis: Secondary | ICD-10-CM | POA: Diagnosis not present

## 2021-11-16 DIAGNOSIS — Z992 Dependence on renal dialysis: Secondary | ICD-10-CM | POA: Diagnosis not present

## 2021-11-16 DIAGNOSIS — D689 Coagulation defect, unspecified: Secondary | ICD-10-CM | POA: Diagnosis not present

## 2021-11-16 DIAGNOSIS — D631 Anemia in chronic kidney disease: Secondary | ICD-10-CM | POA: Diagnosis not present

## 2021-11-16 DIAGNOSIS — N186 End stage renal disease: Secondary | ICD-10-CM | POA: Diagnosis not present

## 2021-11-16 DIAGNOSIS — N2581 Secondary hyperparathyroidism of renal origin: Secondary | ICD-10-CM | POA: Diagnosis not present

## 2021-11-16 DIAGNOSIS — E1122 Type 2 diabetes mellitus with diabetic chronic kidney disease: Secondary | ICD-10-CM | POA: Diagnosis not present

## 2021-11-16 DIAGNOSIS — D509 Iron deficiency anemia, unspecified: Secondary | ICD-10-CM | POA: Diagnosis not present

## 2021-11-18 DIAGNOSIS — N186 End stage renal disease: Secondary | ICD-10-CM | POA: Diagnosis not present

## 2021-11-18 DIAGNOSIS — D509 Iron deficiency anemia, unspecified: Secondary | ICD-10-CM | POA: Diagnosis not present

## 2021-11-18 DIAGNOSIS — D689 Coagulation defect, unspecified: Secondary | ICD-10-CM | POA: Diagnosis not present

## 2021-11-18 DIAGNOSIS — Z992 Dependence on renal dialysis: Secondary | ICD-10-CM | POA: Diagnosis not present

## 2021-11-18 DIAGNOSIS — E1122 Type 2 diabetes mellitus with diabetic chronic kidney disease: Secondary | ICD-10-CM | POA: Diagnosis not present

## 2021-11-18 DIAGNOSIS — N2581 Secondary hyperparathyroidism of renal origin: Secondary | ICD-10-CM | POA: Diagnosis not present

## 2021-11-18 DIAGNOSIS — D631 Anemia in chronic kidney disease: Secondary | ICD-10-CM | POA: Diagnosis not present

## 2021-11-20 DIAGNOSIS — D509 Iron deficiency anemia, unspecified: Secondary | ICD-10-CM | POA: Diagnosis not present

## 2021-11-20 DIAGNOSIS — D631 Anemia in chronic kidney disease: Secondary | ICD-10-CM | POA: Diagnosis not present

## 2021-11-20 DIAGNOSIS — N186 End stage renal disease: Secondary | ICD-10-CM | POA: Diagnosis not present

## 2021-11-20 DIAGNOSIS — Z992 Dependence on renal dialysis: Secondary | ICD-10-CM | POA: Diagnosis not present

## 2021-11-20 DIAGNOSIS — N2581 Secondary hyperparathyroidism of renal origin: Secondary | ICD-10-CM | POA: Diagnosis not present

## 2021-11-20 DIAGNOSIS — E1122 Type 2 diabetes mellitus with diabetic chronic kidney disease: Secondary | ICD-10-CM | POA: Diagnosis not present

## 2021-11-20 DIAGNOSIS — D689 Coagulation defect, unspecified: Secondary | ICD-10-CM | POA: Diagnosis not present

## 2021-11-23 DIAGNOSIS — E1122 Type 2 diabetes mellitus with diabetic chronic kidney disease: Secondary | ICD-10-CM | POA: Diagnosis not present

## 2021-11-23 DIAGNOSIS — N2581 Secondary hyperparathyroidism of renal origin: Secondary | ICD-10-CM | POA: Diagnosis not present

## 2021-11-23 DIAGNOSIS — D509 Iron deficiency anemia, unspecified: Secondary | ICD-10-CM | POA: Diagnosis not present

## 2021-11-23 DIAGNOSIS — N186 End stage renal disease: Secondary | ICD-10-CM | POA: Diagnosis not present

## 2021-11-23 DIAGNOSIS — Z992 Dependence on renal dialysis: Secondary | ICD-10-CM | POA: Diagnosis not present

## 2021-11-23 DIAGNOSIS — D689 Coagulation defect, unspecified: Secondary | ICD-10-CM | POA: Diagnosis not present

## 2021-11-23 DIAGNOSIS — D631 Anemia in chronic kidney disease: Secondary | ICD-10-CM | POA: Diagnosis not present

## 2021-11-24 DIAGNOSIS — T82898A Other specified complication of vascular prosthetic devices, implants and grafts, initial encounter: Secondary | ICD-10-CM | POA: Diagnosis not present

## 2021-11-24 DIAGNOSIS — Z992 Dependence on renal dialysis: Secondary | ICD-10-CM | POA: Diagnosis not present

## 2021-11-24 DIAGNOSIS — I871 Compression of vein: Secondary | ICD-10-CM | POA: Diagnosis not present

## 2021-11-24 DIAGNOSIS — N186 End stage renal disease: Secondary | ICD-10-CM | POA: Diagnosis not present

## 2021-11-27 DIAGNOSIS — E1122 Type 2 diabetes mellitus with diabetic chronic kidney disease: Secondary | ICD-10-CM | POA: Diagnosis not present

## 2021-11-27 DIAGNOSIS — Z992 Dependence on renal dialysis: Secondary | ICD-10-CM | POA: Diagnosis not present

## 2021-11-27 DIAGNOSIS — N2581 Secondary hyperparathyroidism of renal origin: Secondary | ICD-10-CM | POA: Diagnosis not present

## 2021-11-27 DIAGNOSIS — D509 Iron deficiency anemia, unspecified: Secondary | ICD-10-CM | POA: Diagnosis not present

## 2021-11-27 DIAGNOSIS — D631 Anemia in chronic kidney disease: Secondary | ICD-10-CM | POA: Diagnosis not present

## 2021-11-27 DIAGNOSIS — D689 Coagulation defect, unspecified: Secondary | ICD-10-CM | POA: Diagnosis not present

## 2021-11-27 DIAGNOSIS — N186 End stage renal disease: Secondary | ICD-10-CM | POA: Diagnosis not present

## 2021-11-27 DIAGNOSIS — E119 Type 2 diabetes mellitus without complications: Secondary | ICD-10-CM | POA: Diagnosis not present

## 2021-11-27 DIAGNOSIS — Z794 Long term (current) use of insulin: Secondary | ICD-10-CM | POA: Diagnosis not present

## 2021-11-30 DIAGNOSIS — D631 Anemia in chronic kidney disease: Secondary | ICD-10-CM | POA: Diagnosis not present

## 2021-11-30 DIAGNOSIS — Z992 Dependence on renal dialysis: Secondary | ICD-10-CM | POA: Diagnosis not present

## 2021-11-30 DIAGNOSIS — D689 Coagulation defect, unspecified: Secondary | ICD-10-CM | POA: Diagnosis not present

## 2021-11-30 DIAGNOSIS — D509 Iron deficiency anemia, unspecified: Secondary | ICD-10-CM | POA: Diagnosis not present

## 2021-11-30 DIAGNOSIS — E1122 Type 2 diabetes mellitus with diabetic chronic kidney disease: Secondary | ICD-10-CM | POA: Diagnosis not present

## 2021-11-30 DIAGNOSIS — N2581 Secondary hyperparathyroidism of renal origin: Secondary | ICD-10-CM | POA: Diagnosis not present

## 2021-11-30 DIAGNOSIS — N186 End stage renal disease: Secondary | ICD-10-CM | POA: Diagnosis not present

## 2021-12-02 DIAGNOSIS — N186 End stage renal disease: Secondary | ICD-10-CM | POA: Diagnosis not present

## 2021-12-02 DIAGNOSIS — E1122 Type 2 diabetes mellitus with diabetic chronic kidney disease: Secondary | ICD-10-CM | POA: Diagnosis not present

## 2021-12-02 DIAGNOSIS — Z992 Dependence on renal dialysis: Secondary | ICD-10-CM | POA: Diagnosis not present

## 2021-12-02 DIAGNOSIS — D631 Anemia in chronic kidney disease: Secondary | ICD-10-CM | POA: Diagnosis not present

## 2021-12-02 DIAGNOSIS — D509 Iron deficiency anemia, unspecified: Secondary | ICD-10-CM | POA: Diagnosis not present

## 2021-12-02 DIAGNOSIS — D689 Coagulation defect, unspecified: Secondary | ICD-10-CM | POA: Diagnosis not present

## 2021-12-02 DIAGNOSIS — N2581 Secondary hyperparathyroidism of renal origin: Secondary | ICD-10-CM | POA: Diagnosis not present

## 2021-12-07 DIAGNOSIS — D631 Anemia in chronic kidney disease: Secondary | ICD-10-CM | POA: Diagnosis not present

## 2021-12-07 DIAGNOSIS — E1122 Type 2 diabetes mellitus with diabetic chronic kidney disease: Secondary | ICD-10-CM | POA: Diagnosis not present

## 2021-12-07 DIAGNOSIS — Z992 Dependence on renal dialysis: Secondary | ICD-10-CM | POA: Diagnosis not present

## 2021-12-07 DIAGNOSIS — D509 Iron deficiency anemia, unspecified: Secondary | ICD-10-CM | POA: Diagnosis not present

## 2021-12-07 DIAGNOSIS — D689 Coagulation defect, unspecified: Secondary | ICD-10-CM | POA: Diagnosis not present

## 2021-12-07 DIAGNOSIS — N186 End stage renal disease: Secondary | ICD-10-CM | POA: Diagnosis not present

## 2021-12-07 DIAGNOSIS — N2581 Secondary hyperparathyroidism of renal origin: Secondary | ICD-10-CM | POA: Diagnosis not present

## 2021-12-08 ENCOUNTER — Telehealth: Payer: Self-pay | Admitting: Neurology

## 2021-12-08 ENCOUNTER — Institutional Professional Consult (permissible substitution): Payer: Medicare Other | Admitting: Neurology

## 2021-12-08 NOTE — Telephone Encounter (Signed)
Pt cancelled appt due to daughter is not able to bring pt to the appt.

## 2021-12-09 DIAGNOSIS — Z992 Dependence on renal dialysis: Secondary | ICD-10-CM | POA: Diagnosis not present

## 2021-12-09 DIAGNOSIS — D631 Anemia in chronic kidney disease: Secondary | ICD-10-CM | POA: Diagnosis not present

## 2021-12-09 DIAGNOSIS — D689 Coagulation defect, unspecified: Secondary | ICD-10-CM | POA: Diagnosis not present

## 2021-12-09 DIAGNOSIS — D509 Iron deficiency anemia, unspecified: Secondary | ICD-10-CM | POA: Diagnosis not present

## 2021-12-09 DIAGNOSIS — E1122 Type 2 diabetes mellitus with diabetic chronic kidney disease: Secondary | ICD-10-CM | POA: Diagnosis not present

## 2021-12-09 DIAGNOSIS — N186 End stage renal disease: Secondary | ICD-10-CM | POA: Diagnosis not present

## 2021-12-09 DIAGNOSIS — N2581 Secondary hyperparathyroidism of renal origin: Secondary | ICD-10-CM | POA: Diagnosis not present

## 2021-12-11 DIAGNOSIS — D631 Anemia in chronic kidney disease: Secondary | ICD-10-CM | POA: Diagnosis not present

## 2021-12-11 DIAGNOSIS — Z992 Dependence on renal dialysis: Secondary | ICD-10-CM | POA: Diagnosis not present

## 2021-12-11 DIAGNOSIS — N186 End stage renal disease: Secondary | ICD-10-CM | POA: Diagnosis not present

## 2021-12-11 DIAGNOSIS — D689 Coagulation defect, unspecified: Secondary | ICD-10-CM | POA: Diagnosis not present

## 2021-12-11 DIAGNOSIS — D509 Iron deficiency anemia, unspecified: Secondary | ICD-10-CM | POA: Diagnosis not present

## 2021-12-11 DIAGNOSIS — E1122 Type 2 diabetes mellitus with diabetic chronic kidney disease: Secondary | ICD-10-CM | POA: Diagnosis not present

## 2021-12-11 DIAGNOSIS — N2581 Secondary hyperparathyroidism of renal origin: Secondary | ICD-10-CM | POA: Diagnosis not present

## 2021-12-14 DIAGNOSIS — E1122 Type 2 diabetes mellitus with diabetic chronic kidney disease: Secondary | ICD-10-CM | POA: Diagnosis not present

## 2021-12-14 DIAGNOSIS — D509 Iron deficiency anemia, unspecified: Secondary | ICD-10-CM | POA: Diagnosis not present

## 2021-12-14 DIAGNOSIS — N186 End stage renal disease: Secondary | ICD-10-CM | POA: Diagnosis not present

## 2021-12-14 DIAGNOSIS — Z992 Dependence on renal dialysis: Secondary | ICD-10-CM | POA: Diagnosis not present

## 2021-12-14 DIAGNOSIS — D689 Coagulation defect, unspecified: Secondary | ICD-10-CM | POA: Diagnosis not present

## 2021-12-14 DIAGNOSIS — N2581 Secondary hyperparathyroidism of renal origin: Secondary | ICD-10-CM | POA: Diagnosis not present

## 2021-12-14 DIAGNOSIS — E1022 Type 1 diabetes mellitus with diabetic chronic kidney disease: Secondary | ICD-10-CM | POA: Diagnosis not present

## 2021-12-14 DIAGNOSIS — D631 Anemia in chronic kidney disease: Secondary | ICD-10-CM | POA: Diagnosis not present

## 2022-01-14 ENCOUNTER — Ambulatory Visit (HOSPITAL_COMMUNITY)
Admission: RE | Admit: 2022-01-14 | Discharge: 2022-01-14 | Disposition: A | Discharge status: Home / Self Care | Payer: Medicare Other | Source: Ambulatory Visit | Attending: Vascular Surgery | Admitting: Vascular Surgery

## 2022-01-14 ENCOUNTER — Other Ambulatory Visit: Payer: Self-pay | Admitting: *Deleted

## 2022-01-14 DIAGNOSIS — Z794 Long term (current) use of insulin: Secondary | ICD-10-CM | POA: Insufficient documentation

## 2022-01-14 DIAGNOSIS — Z992 Dependence on renal dialysis: Secondary | ICD-10-CM

## 2022-01-14 DIAGNOSIS — N186 End stage renal disease: Secondary | ICD-10-CM | POA: Insufficient documentation

## 2022-01-19 ENCOUNTER — Ambulatory Visit: Payer: Self-pay

## 2022-01-19 DIAGNOSIS — N186 End stage renal disease: Secondary | ICD-10-CM

## 2022-01-26 ENCOUNTER — Ambulatory Visit (INDEPENDENT_AMBULATORY_CARE_PROVIDER_SITE_OTHER): Payer: Self-pay | Admitting: Physician Assistant

## 2022-01-26 ENCOUNTER — Other Ambulatory Visit: Payer: Self-pay

## 2022-01-26 VITALS — BP 143/74 | HR 78 | Temp 98.1°F | Resp 20 | Ht 62.0 in

## 2022-01-26 DIAGNOSIS — Z992 Dependence on renal dialysis: Secondary | ICD-10-CM

## 2022-01-26 DIAGNOSIS — N186 End stage renal disease: Secondary | ICD-10-CM

## 2022-01-26 NOTE — Progress Notes (Signed)
Established Dialysis Access   History of Present Illness   Hannah [name] is a 56 y.o. (1965-06-14) female who presents with her daughter for evaluation of worsening pain in RUE AVF. This has been occurring for several months as intermittent sharp shooting pains but now especially during dialysis it is constant pain. The pain does not radiate to her right hand. She denies any numbness or coldness in her arm or hand. She is s/p right UE second stage basilic fistula on 10/28/41 by Dr. Carlis Abbott. She has history of chronic RUE nerve pain and MS and is followed by pain management Dr. Dagoberto Ligas.   She also reports significant bleeding at dialysis requiring prolonged pressure to be held and has even had bleeding at home following dialysis. She reports several procedures at [facility] over past several months, most recently 1 month ago where she believes they placed a stent. Otherwise she says her fistula has been working well.   She currently dialyzes on MWF at [facility] in [place]  The patient's [place], [place], [place], and Fam[facility]x were reviewed and are unchanged from her previous visit in September of 2022.  Current Outpatient Medications  Medication Sig Dispense Refill   methadone (DOLOPHINE) 5 MG tablet Take 0.5 tablets (2.5 mg total) by mouth every 12 (twelve) hours. 30 tablet 0   amLODipine (NORVASC) 10 MG tablet Take 1 tablet (10 mg total) by mouth daily. 30 tablet 0   calcium acetate (PHOSLO) 667 MG capsule Take 667 mg by mouth in the morning, at noon, and at bedtime.     carvedilol (COREG) 25 MG tablet Take 1 tablet (25 mg total) by mouth 2 (two) times daily with a meal. 60 tablet 0   clopidogrel (PLAVIX) 75 MG tablet Take 1 tablet (75 mg total) by mouth daily. 30 tablet 0   Continuous Blood Gluc Sensor (FREESTYLE LIBRE 2 SENSOR) MISC USE AS DIRECTED CHANGE EVERY 14 DAYS     dantrolene (DANTRIUM) 100 MG capsule Take 1 capsule (100 mg total) by mouth 2 (two) times daily. 60 capsule 5   diclofenac Sodium  (VOLTAREN) 1 % GEL Apply 2 g topically 4 (four) times daily. To leg (Patient taking differently: Apply 2 g topically 4 (four) times daily as needed (pain). To leg) 350 g 0   famotidine (PEPCID) 20 MG tablet Take 1 tablet (20 mg total) by mouth daily. 30 tablet 0   hydrALAZINE (APRESOLINE) 50 MG tablet Take 1 tablet (50 mg total) by mouth every 8 (eight) hours. (Patient taking differently: Take 50 mg by mouth See admin instructions. Take one tablet (50 mg) by mouth every 8 hours on Sunday, Tuesday, Thursday, Saturday (non-dialysis days), take one tablet (50 mg) twice daily on Monday, Wednesday, Friday (dialysis days) - after dialysis and at bedtime) 90 tablet 0   hydrOXYzine (ATARAX/VISTARIL) 10 MG tablet Take 1 tablet (10 mg total) by mouth 3 (three) times daily as needed. (Patient taking differently: Take 10 mg by mouth in the morning and at bedtime.) 30 tablet 1   insulin degludec (TRESIBA FLEXTOUCH) 100 UNIT/ML FlexTouch Pen Inject 12 Units into the skin at bedtime. (Patient taking differently: Inject 4-6 Units into the skin at bedtime. Sliding scale) 15 mL 0   insulin lispro (HUMALOG) 100 UNIT/ML KwikPen Inject 5 Units into the skin 3 (three) times daily. (Patient taking differently: Inject 5 Units into the skin 3 (three) times daily before meals.) 15 mL 0   nitroGLYCERIN (NITROSTAT) 0.4 MG SL tablet Place 1 tablet (0.4  mg total) under the tongue every 5 (five) minutes as needed for chest pain. 30 tablet 12   ondansetron (ZOFRAN) 4 MG tablet Take 4-8 mg by mouth 2 (two) times daily.     polyethylene glycol (MIRALAX / GLYCOLAX) 17 g packet Take 17 g by mouth daily. (Patient taking differently: Take 17 g by mouth daily as needed for moderate constipation.) 30 each 0   rosuvastatin (CRESTOR) 20 MG tablet Take 20 mg by mouth daily.     senna-docusate (SENOKOT-S) 8.6-50 MG tablet Take 1 tablet by mouth 2 (two) times daily as needed for moderate constipation. (Patient taking differently: Take 1 tablet by  mouth 2 (two) times daily.) 60 tablet 0   tiZANidine (ZANAFLEX) 2 MG tablet Take 1 tablet (2 mg total) by mouth at bedtime. (Patient taking differently: Take 2 mg by mouth in the morning and at bedtime.) 30 tablet 0   No current facility-administered medications for this visit.    On ROS today: negative unless stated in HPI   Physical Examination  There were no vitals filed for this visit. There is no height or weight on file to calculate BMI.  General Not in any acute distress  Pulmonary Non labored  Cardiac regualr  Vascular Vessel Right Left  Radial Palpable Palpable  Brachial Palpable Palpable  Right upper arm AV fistula pulsatile but good thrill present. No bleeding. No aneurysmal areas. Some tenderness to palpation. Right hand warm and well perfused. 4/5 grip strength  Musculo- skeletal M/S 4/5 throughout  , Extremities without ischemic changes    Neurologic A&O; CN grossly intact     Non-invasive Vascular Imaging   right Arm Access Duplex  (01/14/22):  Findings:  +--------------------+----------+-----------------+--------+  AVF                 PSV (cm/s)Flow Vol (mL/min)Comments  +--------------------+----------+-----------------+--------+  Native artery inflow   135           583                 +--------------------+----------+-----------------+--------+  AVF Anastomosis        254                               +--------------------+----------+-----------------+--------+    +------------+----------+-------------+----------+-------------------+  OUTFLOW VEINPSV (cm/s)Diameter (cm)Depth (cm)     Describe        +------------+----------+-------------+----------+-------------------+  Prox UA        626        0.17        0.66    residual diameter   +------------+----------+-------------+----------+-------------------+  Mid UA         533        0.74        0.32   partially-occlusive   +------------+----------+-------------+----------+-------------------+  Dist UA        300        0.41        0.82                        +------------+----------+-------------+----------+-------------------+    Summary:  Arteriovenous fistula-Residual lumen of less than 11mm noted in the proximal  upper arm. The area of reduced diameter does not involve the upper arm stent, but is at the terminal end of the stent. Stent is widely patent.     The mid upper arm segment of the basilic vein is partially thrombosed with  reduceed  diameter of 0.16 cm.   Medical Decision Making   Eleena Grater is a 56 y.o. female who presents with worsening pain in right AV fistula. Recent duplex shows narrowing within the vein in several areas. Volume flow is also fairly low. On exam her fistula is pulsatile but has good thrill With recent bleeding episodes I have recommended initially fistulogram with possible intervention She may likely need new access if pain continues to be intolerable. I discussed this with patient and her daughter. Patient would like to avoid [facility] so I recommend that we treat current fistula first before discussion of proceeding with possible new access I also discussed with patient and her daughter that with her history of nerve pain in her right upper arm that even if fistula were to be ligated she could still have continued pain  I will arrange fistulogram with Dr. Carlis Abbott in near future on a non dialysis day   Karoline Caldwell, PA-C [facility] Office: [facility] MD: Roxanne Mins

## 2022-01-28 ENCOUNTER — Encounter (HOSPITAL_COMMUNITY)
Admission: RE | Disposition: A | Discharge status: Home / Self Care | Payer: Self-pay | Source: Home / Self Care | Attending: Vascular Surgery

## 2022-01-28 ENCOUNTER — Ambulatory Visit (HOSPITAL_COMMUNITY)
Admission: RE | Admit: 2022-01-28 | Discharge: 2022-01-28 | Disposition: A | Discharge status: Home / Self Care | Payer: Medicare Other | Attending: Vascular Surgery | Admitting: Vascular Surgery

## 2022-01-28 ENCOUNTER — Other Ambulatory Visit: Payer: Self-pay

## 2022-01-28 DIAGNOSIS — T82898A Other specified complication of vascular prosthetic devices, implants and grafts, initial encounter: Secondary | ICD-10-CM | POA: Diagnosis not present

## 2022-01-28 DIAGNOSIS — T82858A Stenosis of vascular prosthetic devices, implants and grafts, initial encounter: Secondary | ICD-10-CM | POA: Insufficient documentation

## 2022-01-28 DIAGNOSIS — N186 End stage renal disease: Secondary | ICD-10-CM | POA: Insufficient documentation

## 2022-01-28 DIAGNOSIS — E1122 Type 2 diabetes mellitus with diabetic chronic kidney disease: Secondary | ICD-10-CM | POA: Insufficient documentation

## 2022-01-28 DIAGNOSIS — Y841 Kidney dialysis as the cause of abnormal reaction of the patient, or of later complication, without mention of misadventure at the time of the procedure: Secondary | ICD-10-CM | POA: Diagnosis not present

## 2022-01-28 DIAGNOSIS — Z794 Long term (current) use of insulin: Secondary | ICD-10-CM | POA: Diagnosis not present

## 2022-01-28 DIAGNOSIS — N185 Chronic kidney disease, stage 5: Secondary | ICD-10-CM | POA: Diagnosis not present

## 2022-01-28 DIAGNOSIS — Z992 Dependence on renal dialysis: Secondary | ICD-10-CM | POA: Insufficient documentation

## 2022-01-28 HISTORY — PX: PERIPHERAL VASCULAR BALLOON ANGIOPLASTY: CATH118281

## 2022-01-28 LAB — POCT I-STAT, CHEM 8
BUN: 27 mg/dL — ABNORMAL HIGH (ref 6–20)
Calcium, Ion: 1.23 mmol/L (ref 1.15–1.40)
Chloride: 95 mmol/L — ABNORMAL LOW (ref 98–111)
Creatinine, Ser: 4.7 mg/dL — ABNORMAL HIGH (ref 0.44–1.00)
Glucose, Bld: 170 mg/dL — ABNORMAL HIGH (ref 70–99)
HCT: 30 % — ABNORMAL LOW (ref 36.0–46.0)
Hemoglobin: 10.2 g/dL — ABNORMAL LOW (ref 12.0–15.0)
Potassium: 4.5 mmol/L (ref 3.5–5.1)
Sodium: 138 mmol/L (ref 135–145)
TCO2: 32 mmol/L (ref 22–32)

## 2022-01-28 LAB — GLUCOSE, CAPILLARY: Glucose-Capillary: 176 mg/dL — ABNORMAL HIGH (ref 70–99)

## 2022-01-28 SURGERY — PERIPHERAL VASCULAR BALLOON ANGIOPLASTY
Anesthesia: LOCAL | Laterality: Right

## 2022-01-28 MED ORDER — LIDOCAINE HCL (PF) 1 % IJ SOLN
INTRAMUSCULAR | Status: DC | PRN
  Administered 2022-01-28: 2 mL via SUBCUTANEOUS

## 2022-01-28 MED ORDER — HEPARIN (PORCINE) IN NACL 1000-0.9 UT/500ML-% IV SOLN
INTRAVENOUS | Status: AC
  Filled 2022-01-28: qty 500

## 2022-01-28 MED ORDER — MIDAZOLAM HCL 2 MG/2ML IJ SOLN
INTRAMUSCULAR | Status: DC | PRN
  Administered 2022-01-28: 1 mg via INTRAVENOUS

## 2022-01-28 MED ORDER — LIDOCAINE HCL (PF) 1 % IJ SOLN
INTRAMUSCULAR | Status: AC
  Filled 2022-01-28: qty 30

## 2022-01-28 MED ORDER — HEPARIN (PORCINE) IN NACL 1000-0.9 UT/500ML-% IV SOLN
INTRAVENOUS | Status: DC | PRN
  Administered 2022-01-28: 500 mL

## 2022-01-28 MED ORDER — SODIUM CHLORIDE 0.9% FLUSH: 3.0000 mL | Freq: Two times a day (BID) | INTRAVENOUS | Status: DC

## 2022-01-28 MED ORDER — SODIUM CHLORIDE 0.9 % IV SOLN: 250.0000 mL | INTRAVENOUS | Status: DC | PRN

## 2022-01-28 MED ORDER — MIDAZOLAM HCL 2 MG/2ML IJ SOLN
INTRAMUSCULAR | Status: AC
  Filled 2022-01-28: qty 2

## 2022-01-28 MED ORDER — SODIUM CHLORIDE 0.9% FLUSH
3.0000 mL | INTRAVENOUS | Status: DC | PRN
Start: 2022-01-28 — End: 2022-01-28

## 2022-01-28 MED ORDER — FENTANYL CITRATE (PF) 100 MCG/2ML IJ SOLN
INTRAMUSCULAR | Status: AC
  Filled 2022-01-28: qty 2

## 2022-01-28 MED ORDER — IODIXANOL 320 MG/ML IV SOLN
INTRAVENOUS | Status: DC | PRN
  Administered 2022-01-28: 40 mL via INTRAVENOUS

## 2022-01-28 MED ORDER — FENTANYL CITRATE (PF) 100 MCG/2ML IJ SOLN
INTRAMUSCULAR | Status: DC | PRN
  Administered 2022-01-28: 25 ug via INTRAVENOUS

## 2022-01-28 SURGICAL SUPPLY — 17 items
BAG SNAP BAND KOVER 36X36 (MISCELLANEOUS) ×1 IMPLANT
BALLN MUSTANG 7.0X40 75 (BALLOONS) ×1
BALLOON MUSTANG 7.0X40 75 (BALLOONS) IMPLANT
COVER DOME SNAP 22 D (MISCELLANEOUS) ×1 IMPLANT
DCB RANGER 7.0X40 135 (BALLOONS) IMPLANT
KIT ENCORE 26 ADVANTAGE (KITS) IMPLANT
KIT MICROPUNCTURE NIT STIFF (SHEATH) IMPLANT
PROTECTION STATION PRESSURIZED (MISCELLANEOUS) ×2
RANGER DCB 7.0X40 135 (BALLOONS) ×1
SHEATH PINNACLE R/O II 6F 4CM (SHEATH) IMPLANT
SHEATH PROBE COVER 6X72 (BAG) ×1 IMPLANT
STATION PROTECTION PRESSURIZED (MISCELLANEOUS) ×1 IMPLANT
STOPCOCK MORSE 400PSI 3WAY (MISCELLANEOUS) ×1 IMPLANT
TRAY PV CATH (CUSTOM PROCEDURE TRAY) ×1 IMPLANT
TUBING CIL FLEX 10 FLL-RA (TUBING) ×1 IMPLANT
WIRE BENTSON .035X145CM (WIRE) IMPLANT
WIRE SHEPHERD 4G .018 (WIRE) IMPLANT

## 2022-01-28 NOTE — Op Note (Signed)
    OPERATIVE NOTE   PROCEDURE: Right brachiobasilic arteriovenous fistula cannulation under ultrasound guidance Right arm fistulogram including central venogram Right periperhal basilic vein angioplasty (7 mm x 60 mm Mustang and 7 mm x 60 mm drug coated Ranger) 29 minutes monitored moderate conscious sedation time  PRE-OPERATIVE DIAGNOSIS: Malfunctioning right arm arteriovenous fistula  POST-OPERATIVE DIAGNOSIS: same as above   SURGEON: Marty Heck, MD  ANESTHESIA: local  ESTIMATED BLOOD LOSS: 5 cc  FINDING(S): No evidence of central venous stenosis.  There was a stent in the proximal upper arm basilic vein fistula that was widely patent.  In the mid upper arm just before the stent there was a high-grade 95% stenosis.  The remainder of the fistula was patent except it is fairly small adjacent to the artery anastomosis without any overt stenosis. Ultimately the mid upper arm stenosis was treated with a 7 mm Mustang and 7 mm drug-coated Ranger with excellent results and no residual stenosis.  Much better thrill.  SPECIMEN(S):  None  CONTRAST: 40 mL  INDICATIONS: Hannah [name] is a 56 y.o. female who  presents with malfunctioning right brachiobasilic arteriovenous fistula.  The patient is scheduled for right arm fistulogram.  The patient is aware the risks include but are not limited to: bleeding, infection, thrombosis of the cannulated access, and possible anaphylactic reaction to the contrast.  The patient is aware of the risks of the procedure and elects to proceed forward.  DESCRIPTION: After full informed written consent was obtained, the patient was brought back to the angiography suite and placed supine upon the angiography table.  The patient was connected to monitoring equipment.  The right arm was prepped and draped in the standard fashion for a right arm fistulogram.  The patient received versed and fentanyl for moderate sedation.  Vital signs were monitored  including HR, RR, oxygenation, and BP.  Under ultrasound guidance, the right arm arteriovenous fistula was evaluated, it was patent, an image was saved.  It was cannulated with a micropuncture needle.  The microwire was advanced into the fistula and the needle was exchanged for the a microsheath, which was lodged 2 cm into the access.  The wire was removed and the sheath was connected to the IV extension tubing.  Hand injections were completed to image the access from the antecubitum up to the level of axilla.  The central venous structures were also imaged by hand injections.  We elected intervention on the stenosis in the mid upper arm fistula.  A Bentson wire was placed and we exchanged for a short 6 [place] sheath.  3000 units of IV heparin was given.  We treated the stenosis in the mid upper arm basilic vein with a 6 mm x 60 mm Mustang to nominal pressure for 2 minutes.  Then exchanged for an 018 wire and used a 6 mm x 60 mm drug-coated Ranger to nominal pressure for 3 minutes in the mid upper arm basilic vein stenosis.  Widely patent fistula at completion.  A 4-0 Monocryl purse-string suture was sewn around the sheath.  The sheath was removed while tying down the suture.  A sterile bandage was applied to the puncture site.  COMPLICATIONS: None  CONDITION: Stable  Marty Heck, MD [facility] Office: [place]   01/28/2022 12:43 PM

## 2022-01-28 NOTE — H&P (Signed)
History and Physical Interval Note:  01/28/2022 11:53 AM  Hannah [name]  has presented today for surgery, with the diagnosis of instage renal.  The various methods of treatment have been discussed with the patient and family. After consideration of risks, benefits and other options for treatment, the patient has consented to  Procedure(s): A/V Fistulagram (Right) as a surgical intervention.  The patient's history has been reviewed, patient examined, no change in status, stable for surgery.  I have reviewed the patient's chart and labs.  Questions were answered to the patient's satisfaction.     Marty Heck  Established Dialysis Access     History of Present Illness    Hannah [name] is a 56 y.o. (08-Jul-1965) female who presents with her daughter for evaluation of worsening pain in RUE AVF. This has been occurring for several months as intermittent sharp shooting pains but now especially during dialysis it is constant pain. The pain does not radiate to her right hand. She denies any numbness or coldness in her arm or hand. She is s/p right UE second stage basilic fistula on 5/95/63 by Dr. Carlis Abbott. She has history of chronic RUE nerve pain and MS and is followed by pain management Dr. Dagoberto Ligas.    She also reports significant bleeding at dialysis requiring prolonged pressure to be held and has even had bleeding at home following dialysis. She reports several procedures at [facility] over past several months, most recently 1 month ago where she believes they placed a stent. Otherwise she says her fistula has been working well.    She currently dialyzes on MWF at [facility] in [place]   The patient's [place], [address], [place], and Fam[facility]x were reviewed and are unchanged from her previous visit in September of 2022.         Current Outpatient Medications  Medication Sig Dispense Refill   methadone (DOLOPHINE) 5 MG tablet Take 0.5 tablets (2.5 mg total) by mouth every 12 (twelve) hours. 30 tablet 0    amLODipine (NORVASC) 10 MG tablet Take 1 tablet (10 mg total) by mouth daily. 30 tablet 0   calcium acetate (PHOSLO) 667 MG capsule Take 667 mg by mouth in the morning, at noon, and at bedtime.       carvedilol (COREG) 25 MG tablet Take 1 tablet (25 mg total) by mouth 2 (two) times daily with a meal. 60 tablet 0   clopidogrel (PLAVIX) 75 MG tablet Take 1 tablet (75 mg total) by mouth daily. 30 tablet 0   Continuous Blood Gluc Sensor (FREESTYLE LIBRE 2 SENSOR) MISC USE AS DIRECTED CHANGE EVERY 14 DAYS       dantrolene (DANTRIUM) 100 MG capsule Take 1 capsule (100 mg total) by mouth 2 (two) times daily. 60 capsule 5   diclofenac Sodium (VOLTAREN) 1 % GEL Apply 2 g topically 4 (four) times daily. To leg (Patient taking differently: Apply 2 g topically 4 (four) times daily as needed (pain). To leg) 350 g 0   famotidine (PEPCID) 20 MG tablet Take 1 tablet (20 mg total) by mouth daily. 30 tablet 0   hydrALAZINE (APRESOLINE) 50 MG tablet Take 1 tablet (50 mg total) by mouth every 8 (eight) hours. (Patient taking differently: Take 50 mg by mouth See admin instructions. Take one tablet (50 mg) by mouth every 8 hours on Sunday, Tuesday, Thursday, Saturday (non-dialysis days), take one tablet (50 mg) twice daily on Monday, Wednesday, Friday (dialysis days) - after dialysis and at bedtime) 90 tablet 0  hydrOXYzine (ATARAX/VISTARIL) 10 MG tablet Take 1 tablet (10 mg total) by mouth 3 (three) times daily as needed. (Patient taking differently: Take 10 mg by mouth in the morning and at bedtime.) 30 tablet 1   insulin degludec (TRESIBA FLEXTOUCH) 100 UNIT/ML FlexTouch Pen Inject 12 Units into the skin at bedtime. (Patient taking differently: Inject 4-6 Units into the skin at bedtime. Sliding scale) 15 mL 0   insulin lispro (HUMALOG) 100 UNIT/ML KwikPen Inject 5 Units into the skin 3 (three) times daily. (Patient taking differently: Inject 5 Units into the skin 3 (three) times daily before meals.) 15 mL 0    nitroGLYCERIN (NITROSTAT) 0.4 MG SL tablet Place 1 tablet (0.4 mg total) under the tongue every 5 (five) minutes as needed for chest pain. 30 tablet 12   ondansetron (ZOFRAN) 4 MG tablet Take 4-8 mg by mouth 2 (two) times daily.       polyethylene glycol (MIRALAX / GLYCOLAX) 17 g packet Take 17 g by mouth daily. (Patient taking differently: Take 17 g by mouth daily as needed for moderate constipation.) 30 each 0   rosuvastatin (CRESTOR) 20 MG tablet Take 20 mg by mouth daily.       senna-docusate (SENOKOT-S) 8.6-50 MG tablet Take 1 tablet by mouth 2 (two) times daily as needed for moderate constipation. (Patient taking differently: Take 1 tablet by mouth 2 (two) times daily.) 60 tablet 0   tiZANidine (ZANAFLEX) 2 MG tablet Take 1 tablet (2 mg total) by mouth at bedtime. (Patient taking differently: Take 2 mg by mouth in the morning and at bedtime.) 30 tablet 0    No current facility-administered medications for this visit.      On ROS today: negative unless stated in HPI     Physical Examination  There were no vitals filed for this visit. There is no height or weight on file to calculate BMI.   General Not in any acute distress  Pulmonary Non labored  Cardiac regualr  Vascular Vessel Right Left  Radial Palpable Palpable  Brachial Palpable Palpable  Right upper arm AV fistula pulsatile but good thrill present. No bleeding. No aneurysmal areas. Some tenderness to palpation. Right hand warm and well perfused. 4/5 grip strength  Musculo- skeletal M/S 4/5 throughout  , Extremities without ischemic changes    Neurologic A&O; CN grossly intact        Non-invasive Vascular Imaging    right Arm Access Duplex  (01/14/22):  Findings:  +--------------------+----------+-----------------+--------+  AVF                 PSV (cm/s)Flow Vol (mL/min)Comments  +--------------------+----------+-----------------+--------+  Native artery inflow   135           583                  +--------------------+----------+-----------------+--------+  AVF Anastomosis        254                               +--------------------+----------+-----------------+--------+    +------------+----------+-------------+----------+-------------------+  OUTFLOW VEINPSV (cm/s)Diameter (cm)Depth (cm)     Describe        +------------+----------+-------------+----------+-------------------+  Prox UA        626        0.17        0.66    residual diameter   +------------+----------+-------------+----------+-------------------+  Mid UA         533  0.74        0.32   partially-occlusive  +------------+----------+-------------+----------+-------------------+  Dist UA        300        0.41        0.82                        +------------+----------+-------------+----------+-------------------+    Summary:  Arteriovenous fistula-Residual lumen of less than 3mm noted in the proximal  upper arm. The area of reduced diameter does not involve the upper arm stent, but is at the terminal end of the stent. Stent is widely patent.     The mid upper arm segment of the basilic vein is partially thrombosed with  reduceed diameter of 0.16 cm.    Medical Decision Making    Hannah [name] is a 56 y.o. female who presents with worsening pain in right AV fistula. Recent duplex shows narrowing within the vein in several areas. Volume flow is also fairly low. On exam her fistula is pulsatile but has good thrill With recent bleeding episodes I have recommended initially fistulogram with possible intervention She may likely need new access if pain continues to be intolerable. I discussed this with patient and her daughter. Patient would like to avoid [facility] so I recommend that we treat current fistula first before discussion of proceeding with possible new access I also discussed with patient and her daughter that with her history of nerve pain in her right upper arm that even if  fistula were to be ligated she could still have continued pain  I will arrange fistulogram with Dr. Carlis Abbott in near future on a non dialysis day    Karoline Caldwell, PA-C [facility] Office: [facility] MD: Roxanne Mins

## 2022-01-29 ENCOUNTER — Encounter (HOSPITAL_COMMUNITY): Payer: Self-pay | Admitting: Vascular Surgery

## 2022-03-02 ENCOUNTER — Other Ambulatory Visit: Payer: Self-pay | Admitting: *Deleted

## 2022-03-02 DIAGNOSIS — N186 End stage renal disease: Secondary | ICD-10-CM

## 2022-03-02 NOTE — Progress Notes (Unsigned)
Established Dialysis Access   History of Present Illness   Hannah [name] is a 56 y.o. (08/23/1965) female who presents for re evaluation of bleeding and pain in right upper extremity AV fistula. I recently saw her in the office on 01/26/22 at which time she was having right arm pain and prolonged bleeding after dialysis. Her pain had been occurring for several months as intermittent sharp shooting pains, but had become more of a constant pain. No radiation of the pain to her right hand. She has history of chronic RUE nerve pain and MS and is followed by pain management, Dr. Dagoberto Ligas.  Due to the bleeding and some stenosis identified on ultrasound I had recommended a fistulogram. On 01/28/22 she had a RUE fistulogram by Dr. Carlis Abbott. She had 95% stenosis in the mid fistula just distal to previously placed stent otherwise fistula was patent. This was treated with [facility] angioplasty with no residual stenosis.   She presents today with her daughter with reports of recurrent bleeding and pain. She says the bleeding really did not change post intervention. She reports also today that her fistula has not been running well at dialysis. Explains that the last several times the machine has been alarming throughout her session. Her pain continues to be unchanged. Intermittent sharp shooting pains in upper right arm. Occurs at any time but often worse during dialysis. No radiation of pain into right forearm or hand. She still has been having prolonged bleeding after dialysis. She says however that seems to depend on who sticks her, but more often then not she has bleeding. No bleeding episodes at home.   She currently dialyzes on MWF at [facility] in [place]   The patient's [place], [place], [place], and Fam[facility]x were reviewed and are unchanged from her previous visit  Current Outpatient Medications  Medication Sig Dispense Refill   acetaminophen (TYLENOL) 325 MG tablet Take 650 mg by mouth every 6 (six) hours as needed for moderate  pain.     amLODipine (NORVASC) 10 MG tablet Take 1 tablet (10 mg total) by mouth daily. 30 tablet 0   calcium acetate (PHOSLO) 667 MG capsule Take 667 mg by mouth in the morning, at noon, and at bedtime.     carvedilol (COREG) 25 MG tablet Take 1 tablet (25 mg total) by mouth 2 (two) times daily with a meal. 60 tablet 0   clopidogrel (PLAVIX) 75 MG tablet Take 1 tablet (75 mg total) by mouth daily. 30 tablet 0   Continuous Blood Gluc Sensor (FREESTYLE LIBRE 2 SENSOR) MISC USE AS DIRECTED CHANGE EVERY 14 DAYS     diclofenac Sodium (VOLTAREN) 1 % GEL Apply 2 g topically 4 (four) times daily. To leg (Patient taking differently: Apply 2 g topically 4 (four) times daily as needed (pain). To leg) 350 g 0   famotidine (PEPCID) 20 MG tablet Take 1 tablet (20 mg total) by mouth daily. 30 tablet 0   insulin degludec (TRESIBA FLEXTOUCH) 100 UNIT/ML FlexTouch Pen Inject 12 Units into the skin at bedtime. (Patient taking differently: Inject 8-12 Units into the skin at bedtime. Sliding scale) 15 mL 0   insulin lispro (HUMALOG) 100 UNIT/ML KwikPen Inject 5 Units into the skin 3 (three) times daily. (Patient taking differently: Inject 6 Units into the skin 3 (three) times daily before meals.) 15 mL 0   lidocaine-prilocaine (EMLA) cream Apply topically.     metoCLOPramide (REGLAN) 5 MG tablet Take 5 mg by mouth 3 (three) times daily before meals.  nitroGLYCERIN (NITROSTAT) 0.4 MG SL tablet Place 1 tablet (0.4 mg total) under the tongue every 5 (five) minutes as needed for chest pain. 30 tablet 12   polyethylene glycol (MIRALAX / GLYCOLAX) 17 g packet Take 17 g by mouth daily. (Patient taking differently: Take 17 g by mouth daily as needed for moderate constipation.) 30 each 0   rosuvastatin (CRESTOR) 20 MG tablet Take 20 mg by mouth daily.     senna-docusate (SENOKOT-S) 8.6-50 MG tablet Take 1 tablet by mouth 2 (two) times daily as needed for moderate constipation. 60 tablet 0   tiZANidine (ZANAFLEX) 2 MG tablet  Take 1 tablet (2 mg total) by mouth at bedtime. (Patient taking differently: Take 2 mg by mouth in the morning and at bedtime.) 30 tablet 0   No current facility-administered medications for this visit.    On ROS today: negative unless stated in HPI   Physical Examination   Vitals:   03/04/22 0822  BP: (!) 154/84  Pulse: 77  Resp: 20  Temp: 98.2 F (36.8 C)  TempSrc: Temporal  SpO2: 100%  Height: 5\' 2"  (1.575 m)   Body mass index is 18.66 kg/m.  General Chronically ill appearing, in no acute distress, pleasant  Pulmonary Non labored  Cardiac regular  Vascular Vessel Right Left  Radial Palpable Palpable  Brachial Palpable Palpable  Ulnar Not palpable Not palpable    Musculo- skeletal M/S 5/5 throughout  , Extremities without ischemic changes  ; RUE fistula with good thrill, audible bruit. No swelling, no bleeding.   Neurologic A&O; CN grossly intact     Non-invasive Vascular Imaging   right Arm Access Duplex  (03/04/22):  Duplex shows AV fistula with two areas of visualized stenosis in the proximal and mid upper arm with plaque. Stent was not well visualized. Volume flow of 627 ml/min   Medical Decision Making   [name] is a 56 y.o. female who presents with with concerns of continued bleeding after dialysis, pain and fistula not running well at dialysis. She just recently on 01/28/22 she had a RUE fistulogram by Dr. Carlis Abbott. She had 95% stenosis in the mid fistula just distal to previously placed stent otherwise fistula was patent. This was treated with [facility] angioplasty with no residual stenosis.  Duplex today is very similar in findings to her last duplex pre intervention. Volume flow is slightly more. Still appears to be some visualized stenosis in the mid and upper arm. On Fistulogram there was only stenosis identified in the mid fistula distal to previously placed stent. I therefore suspect that duplex is overestimating stenosis. However, patient reporting  malfunction of the fistula. I will contact her dialysis center to figure out what issues they have been having with her access. - I also re discussed with patient and her daughter that some pain can be expected from fistula. She also has long history of RUE nerve pain. I discussed potential for ligation of her fistula but my concern is that she would still have pain - At this time patient and her daughter are undecided about how they want to proceed - I will reach out to dialysis center and then call patient to follow up on plan  Addendum: I spoke with Dialysis Tech- Alyse Low who did explain that once patient is on machine they have trouble pulling from both arterial and venous ends. They get a low flow volume on their machine. Explains that it was actually working better prior to her most recent intervention. She also  says patient does complaint of a lot of pain during treatments and that she bleeds a lot at the completion of her dialysis sessions. I will discuss case with MD to determine best approach going forward.    Karoline Caldwell, PA-C [facility] Office: [phone]  Clinic MD: Donzetta Matters

## 2022-03-04 ENCOUNTER — Encounter: Payer: Self-pay | Admitting: Physician Assistant

## 2022-03-04 ENCOUNTER — Ambulatory Visit (HOSPITAL_COMMUNITY)
Admission: RE | Admit: 2022-03-04 | Discharge: 2022-03-04 | Disposition: A | Discharge status: Home / Self Care | Payer: Medicare Other | Source: Ambulatory Visit | Attending: Vascular Surgery | Admitting: Vascular Surgery

## 2022-03-04 ENCOUNTER — Ambulatory Visit: Payer: Medicare Other | Admitting: Physician Assistant

## 2022-03-04 VITALS — BP 154/84 | HR 77 | Temp 98.2°F | Resp 20 | Ht 62.0 in

## 2022-03-04 DIAGNOSIS — N186 End stage renal disease: Secondary | ICD-10-CM | POA: Diagnosis present

## 2022-03-04 DIAGNOSIS — Z992 Dependence on renal dialysis: Secondary | ICD-10-CM | POA: Diagnosis not present

## 2022-03-08 ENCOUNTER — Telehealth: Payer: Self-pay

## 2022-03-08 NOTE — Patient Outreach (Signed)
  Care Coordination   03/08/2022 Name: Delphine Sizemore MRN: [id number] DOB: 29-Jun-1965   Care Coordination Outreach Attempts:  An unsuccessful telephone outreach was attempted today to offer the patient information about available care coordination services as a benefit of their health plan.   Follow Up Plan:  Additional outreach attempts will be made to offer the patient care coordination information and services.   Encounter Outcome:  No Answer  Care Coordination Interventions Activated:  No   Care Coordination Interventions:  No, not indicated    Enzo Montgomery, RN,BSN,CCM [place] [facility] Telephonic Care Management Coordinator Direct Phone: [phone] Toll Free: [phone] Fax: [phone]

## 2022-03-10 ENCOUNTER — Telehealth: Payer: Self-pay

## 2022-03-10 NOTE — Patient Outreach (Signed)
  Care Coordination   03/10/2022 Name: Hannah [name] MRN: [id number] DOB: Jun 09, 1965   Care Coordination Outreach Attempts:  A second unsuccessful outreach was attempted today to offer the patient with information about available care coordination services as a benefit of their health plan.     Follow Up Plan:  Additional outreach attempts will be made to offer the patient care coordination information and services.   Encounter Outcome:  No Answer  Care Coordination Interventions Activated:  No   Care Coordination Interventions:  No, not indicated    Enzo Montgomery, RN,BSN,CCM [place] [facility] Telephonic Care Management Coordinator Direct Phone: [phone] Toll Free: [phone] Fax: [phone]

## 2022-03-15 ENCOUNTER — Telehealth: Payer: Self-pay

## 2022-03-15 NOTE — Patient Outreach (Signed)
  Care Coordination   03/15/2022 Name: Yolando Gillum MRN: [id number] DOB: [birth year]   Care Coordination Outreach Attempts:  A third unsuccessful outreach was attempted today to offer the patient with information about available care coordination services as a benefit of their health plan.   Follow Up Plan:  No further outreach attempts will be made at this time. We have been unable to contact the patient to offer or enroll patient in care coordination services  Encounter Outcome:  No Answer  Care Coordination Interventions Activated:  No   Care Coordination Interventions:  No, not indicated    Enzo Montgomery, RN,BSN,CCM [place] [facility] Telephonic Care Management Coordinator Direct Phone: [phone] Toll Free: [phone] Fax: [phone]

## 2022-03-17 ENCOUNTER — Other Ambulatory Visit: Payer: Self-pay | Admitting: Family Medicine

## 2022-03-17 DIAGNOSIS — Z1231 Encounter for screening mammogram for malignant neoplasm of breast: Secondary | ICD-10-CM

## 2022-03-18 ENCOUNTER — Encounter: Payer: Self-pay | Admitting: Speech Pathology

## 2022-03-18 ENCOUNTER — Ambulatory Visit: Payer: Medicare Other | Attending: Family Medicine | Admitting: Speech Pathology

## 2022-03-18 DIAGNOSIS — R490 Dysphonia: Secondary | ICD-10-CM | POA: Insufficient documentation

## 2022-03-18 DIAGNOSIS — R1310 Dysphagia, unspecified: Secondary | ICD-10-CM | POA: Diagnosis not present

## 2022-03-18 DIAGNOSIS — R471 Dysarthria and anarthria: Secondary | ICD-10-CM | POA: Insufficient documentation

## 2022-03-18 NOTE — Therapy (Unsigned)
OUTPATIENT SPEECH LANGUAGE PATHOLOGY SWALLOW EVALUATION   Patient Name: Hannah [name] MRN: [id number] DOB:12/22/1965, 56 y.o., female Today's Date: 03/18/2022  PCP: Vernie Shanks, MD REFERRING PROVIDER: Roddie Mc, NP    End of Session - 03/18/22 1323     Visit Number 1    Number of Visits 7    Date for SLP Re-Evaluation 04/29/22    Authorization Type [employer] Medicare    Progress Note Due on Visit 10    SLP Start Time 1235    SLP Stop Time  5956    SLP Time Calculation (min) 48 min    Activity Tolerance Patient tolerated treatment well             Past Medical History:  Diagnosis Date   Cerebral infarction ([place]) 07/12/2017   2009 first one, has had a total of 4 stokes   Chronic kidney disease    tues thurs sat - dialysis   Diabetes type 2, controlled ([place])    Hemiparesis affecting right side as late effect of cerebrovascular accident (CVA) ([place])    Hyperlipidemia    Hypertension    Multiple sclerosis ([place]) 2009   Stroke [facility])    Walker as ambulation aid    Past Surgical History:  Procedure Laterality Date   A/V FISTULAGRAM Right 02/12/2021   Procedure: A/V FISTULAGRAM;  Surgeon: Marty Heck, MD;  Location: [place] [facility];  Service: Cardiovascular;  Laterality: Right;   APPENDECTOMY     AV FISTULA PLACEMENT Right 10/02/2020   Procedure: RIGHT ARTERIOVENOUS (AV) FISTULA CREATION;  Surgeon: Waynetta Sandy, MD;  Location: [place];  Service: Vascular;  Laterality: Right;   [place] Right 11/24/2020   Procedure: SECOND STAGE RIGHT ARM [place];  Surgeon: Marty Heck, MD;  Location: [place];  Service: Vascular;  Laterality: Right;   CYSTOSTOMY W/ BLADDER BIOPSY     IR FLUORO GUIDE CV LINE RIGHT  09/29/2020   IR US GUIDE VASC ACCESS RIGHT  09/29/2020   OVARIAN CYST SURGERY     PERIPHERAL VASCULAR BALLOON ANGIOPLASTY Right 02/12/2021   Procedure: PERIPHERAL VASCULAR BALLOON ANGIOPLASTY;  Surgeon: Marty Heck, MD;  Location: [place] [facility];  Service: Cardiovascular;  Laterality: Right;  UPPER ARM FISTULA   PERIPHERAL VASCULAR BALLOON ANGIOPLASTY Right 01/28/2022   Procedure: PERIPHERAL VASCULAR BALLOON ANGIOPLASTY;  Surgeon: Marty Heck, MD;  Location: [place] [facility];  Service: Cardiovascular;  Laterality: Right;  Arm fistula   stent placed in heart     Patient Active Problem List   Diagnosis Date Noted   Long term (current) use of insulin ([place]) 01/14/2022   Chronic constipation 06/09/2021   Altered mental status, unspecified 01/12/2021   Other chronic postprocedural pain 01/09/2021   Past heart attack 11/13/2020   Spastic hemiplegia due to cerebrovascular disease ([place]) 11/03/2020   Debility 10/18/2020   ESRD on hemodialysis (HCC)    SOB (shortness of breath) 09/27/2020   Volume overload 09/26/2020   CKD (chronic kidney disease) stage 5, GFR less than 15 ml/min ([place]) 09/13/2020   H/O: CVA (cerebrovascular accident) 09/12/2020   Acute blood loss anemia    Chronic kidney disease (CKD), stage IV (severe) ([place])    Uncontrolled type 2 diabetes mellitus with hyperglycemia ([place])    Essential hypertension    Right pontine cerebrovascular accident ([place]) 06/12/2020   Stroke-like symptoms 06/09/2020   Acute kidney injury superimposed on CKD ([place]) 06/09/2020   Cerebral thrombosis with cerebral infarction 06/09/2020  Hypertension    Hyperlipidemia    Diabetes type 2, controlled ([place])    Bimalleolar ankle fracture 11/24/2018   Lower abdominal pain 07/28/2017   Bladder mass 07/18/2017   Painless hematuria 07/18/2017   Intractable vomiting with nausea 07/12/2017   Hemiparesis affecting right side as late effect of cerebrovascular accident (CVA) ([place]) 09/22/2016   AKI (acute kidney injury) ([place]) 01/08/2016   CAD (coronary artery disease) 01/07/2016   Multiple sclerosis ([place]) 2009    ONSET DATE: referral 03-16-22, per pt ongoing for "last few months"  REFERRING  DIAG: R13.13 (ICD-10-CM) - Dysphagia, pharyngeal phase   THERAPY DIAG:  Dysphagia, unspecified type  Dysphonia  Dysarthria and anarthria  Rationale for Evaluation and Treatment: Rehabilitation  SUBJECTIVE:   SUBJECTIVE STATEMENT: "I'm having trouble swallowing" Pt accompanied by: family member (daughter)  PERTINENT HISTORY: PMHx significant for MS, CVA 2019, pt reports acid reflux attempting to be managed by meds (pt reports ineffective),   PAIN:  Are you having pain? No  FALLS: Has patient fallen in last 6 months?  No  LIVING ENVIRONMENT: Lives with: lives with their daughter Lives in: House/apartment  PLOF:  Level of assistance: Needed assistance with ADLs, Needed assistance with IADLS Employment: Retired  PATIENT GOALS: improve swallow  OBJECTIVE:   RECOMMENDATIONS FROM OBJECTIVE SWALLOW STUDY (MBSS/FEES):  TBD  COGNITION: Overall cognitive status: Within functional limits for tasks assessed Functional deficits: Lives with daughter who is pt's caregiver and provides A for activities of daily living. No cognitive concerns at this time. Language appropriate and sufficient for evaluation.   ORAL MOTOR EXAMINATION: Overall status: Impaired Labial: reduced movement to L  Tongue: slight deviation to R upon protrusion; bilateral weakness Comments: lingual fasciculations present  CLINICAL SWALLOW ASSESSMENT:   Current diet: regular and thin liquids Dentition: missing dentition (molars removed in July) Patient directly observed with POs: Yes: regular, dysphagia 1 (puree), and thin liquids  Feeding: able to feed self Liquids provided by: cup and sequential sips Oral phase signs and symptoms: prolonged bolus formation (likely 2/2 chewing with anterior teeth d/t missing molars) Pharyngeal phase signs and symptoms: no overt s/sx of aspiration present  VOICE ASSESSMENT: Voice quality: hoarse Vocal abuse: none reported or evidenced Resonance: normal Respiratory  function: speaking on residual capacity Maximum phonation time for sustained "ah": 10 seconds; notable for phonation breaks   MOTOR SPEECH ASSESSMENT: Articulation: Imprecise at sentence level Emotional expression: Intact Rate of speech: variable with decreased intelligibility with increased rate Dysfluencies: none observed Vocal loudness: WNL Intelligibility: reduced (approx. 90% to trained listener)   TODAY'S TREATMENT:  03-18-22: Education on evaluation observations and recommendation for instrumental swallow study to assess pt's swallow function and safety based on pt and caregiver's report. Note that no overt s/sx of aspiration were present this date with liquids or solids. SLP educated on swallow strategies and modifications which are recommended in the interim to aid in safe and effective swallow: small bites/sips, use liquid wash, 1 pill at a time, BID oral care, manage reflux symptoms and remain upright during and for 30 minutes following eating. Pt and daughter verbalize understanding of all education via teach back with rare min-A for completeness. Collaborated with pt to generate POC and goals. All questions answered to satisfaction at conclusion of session.   PATIENT EDUCATION: Education details: aspiration and reflux precautions, meal time modifications, MBSS recommendation Person educated: Patient and Caregiver (daughter) Education method: Explanation, Demonstration, and Handouts Education comprehension: verbalized understanding, returned demonstration, and needs further education   ASSESSMENT:  CLINICAL IMPRESSION: Ms. Mikaella Escalona was seek today at request of FNP Huey Bienenstock for dysphagia evaluation d/t pt reported difficulties with swallowing. Pt reporting usual coughing with thin liquids and solids at home. Has implemented small bites/sips and avoids distractions when eating with some success. Has not altered diet, denies odynophagia or recent pneumonia. Last doctors  visit note states lungs are CTAB. During clinical swallow evaluation, pt exhibited no overt s/s of aspiration with solids or liquids.  She had slightly prolonged mastication of solids due to missing dentition. Speech notable for impercise articulation impacting intelligibility. Pt able to teach back dysarthria strategies but requests additional A from SLP for implementation of strategies to aid in effective communication. Pt reporting voice change which has coincided with swallowing difficulties. Decreased breath support observed during sustained phonation. Voice noted to be hoarse and + vocal tremor, which remained consistent following PO administration.Pt with prior strokes which, per pt and daughter, coincide with voice and speech abnormalities, though further change in voice endorsed.   OBJECTIVE IMPAIRMENTS: include dysarthria, voice disorder, and dysphagia. These impairments are limiting patient from effectively communicating at home and in community and safety when swallowing. Factors affecting potential to achieve goals and functional outcome are co-morbidities, medical prognosis, and previous level of function. Patient will benefit from skilled SLP services to address above impairments and improve overall function.  REHAB POTENTIAL: Fair     GOALS: Goals reviewed with patient? Yes  SHORT TERM GOALS: Target date: 04-15-2022  Pt will complete instrumental swallow evaluation  Baseline: Goal status: INITIAL  2.  Pt will demonstrate dysarthria strategies in oral reading at paragraph level with rare min-A with 80% accuracy Baseline:  Goal status: INITIAL  3.  Pt will accurately demonstrate all dysphagia exercises with min-A, if indicated by MBSS Baseline:  Goal status: INITIAL  4.  Pt and caregiver will teach back swallow compensations and mealtime modifications to optimize swallow function and safety with mod-I Baseline:  Goal status: INITIAL  LONG TERM GOALS: Target date:  04-29-2022  1.  Pt and caregiver will report carryover of swallow compensations and mealtime modifications with mod-I, resulting in improved PO intake at home Baseline:  Goal status: INITIAL  2.  Pt will report adherence with prescribed HEP (frequency and duration) over 1 week period with mod-I Baseline:  Goal status: INITIAL  3. Pt will report subjective improvement in swallow function via patient reported outcome measure by d/c Baseline:  Goal status: INITIAL   PLAN:  SLP FREQUENCY: 1x/week  SLP DURATION: 6 weeks  PLANNED INTERVENTIONS: Aspiration precaution training, Pharyngeal strengthening exercises, Diet toleration management , Cueing hierachy, Internal/external aids, Oral motor exercises, Functional tasks, SLP instruction and feedback, Compensatory strategies, Patient/family education, and Re-evaluation    Su Monks, CCC-SLP 03/18/2022, 1:23 PM

## 2022-03-23 ENCOUNTER — Ambulatory Visit
Admission: RE | Admit: 2022-03-23 | Discharge: 2022-03-23 | Disposition: A | Discharge status: Home / Self Care | Payer: Medicare Other | Source: Ambulatory Visit | Attending: Family Medicine | Admitting: Family Medicine

## 2022-03-23 ENCOUNTER — Other Ambulatory Visit: Payer: Self-pay | Admitting: Family Medicine

## 2022-03-23 DIAGNOSIS — M533 Sacrococcygeal disorders, not elsewhere classified: Secondary | ICD-10-CM

## 2022-03-24 ENCOUNTER — Telehealth (HOSPITAL_COMMUNITY): Payer: Self-pay

## 2022-03-24 NOTE — Telephone Encounter (Signed)
Attempted to contact patient to schedule Modified Barium Swallow - left voicemail. 

## 2022-03-31 ENCOUNTER — Other Ambulatory Visit (HOSPITAL_COMMUNITY): Payer: Self-pay

## 2022-03-31 DIAGNOSIS — R1310 Dysphagia, unspecified: Secondary | ICD-10-CM

## 2022-04-01 ENCOUNTER — Ambulatory Visit: Payer: Medicare Other | Admitting: Neurology

## 2022-04-01 ENCOUNTER — Encounter: Payer: Self-pay | Admitting: Neurology

## 2022-04-01 VITALS — BP 144/71 | HR 73 | Ht 62.0 in | Wt 109.0 lb

## 2022-04-01 DIAGNOSIS — Z8673 Personal history of transient ischemic attack (TIA), and cerebral infarction without residual deficits: Secondary | ICD-10-CM | POA: Diagnosis not present

## 2022-04-01 DIAGNOSIS — J383 Other diseases of vocal cords: Secondary | ICD-10-CM | POA: Diagnosis not present

## 2022-04-01 DIAGNOSIS — G8110 Spastic hemiplegia affecting unspecified side: Secondary | ICD-10-CM | POA: Diagnosis not present

## 2022-04-01 DIAGNOSIS — I679 Cerebrovascular disease, unspecified: Secondary | ICD-10-CM | POA: Diagnosis not present

## 2022-04-01 NOTE — Progress Notes (Signed)
[facility] [address] [place]. [place] [address] [phone]       OFFICE CONSULT NOTE  [name] Date of Birth:  58 24, 1967 Medical Record Number:  [id number]   Referring MD: Faustino Congress, NP  Reason for Referral: Stroke  HPI: Hannah [name] is a 56 year old African-American lady seen today for initial office consultation visit.  She is accompanied by her daughter.  History is provided by them and review of electronic medical records and opossum reviewed pertinent available imaging in PACS and results in care everywhere .  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  Patient apparently has never been started on treatment for multiple sclerosis or followed up with any Neurologist for the same diagnosis.  I apparently saw her in 2019 and asked for records from previous neurologist but never received them and Patient was lost to follow-up MRI scan of the brain in [facility] in August 2017 showed enhancing right pontine subacute and as well as old left basal ganglia, left cerebellar peduncle neurology degeneration subacute moderate MRI on 07/11/2017 also shows old bilateral lacunar left basal ganglia and left great infarcts.  Patient states she apparently saw me in 2019 but was unable to get my office records.  Patient apparently was able to ambulate at baseline without walker but needed some assistance until January 2021 when she had worsening renal failure and overall physical decline she can help with transfers and can go to the restroom but needs 1 person assist.  24-hour care and  cannot be left her cognition is fine and she has meaningful conversation and can patient states she is unable to walk because she feels dizzy .She denies true vertigo, diplopia creased focal weakness.  She was seen in the ER at [facility] [address] on 06/09/2020 for worsening vision.  Her last MRI in the system was from 01/12/2021 and showed normal numerous chronic.  Foci of microhemorrhage in the central distribution compatible with chronic hypertensive angiopathy. Patient denies any recent acute symptoms to suggest stroke or TIA or active multiple sclerosis.    ROS:   14 system review of systems is positive for weakness, dysarthria, imbalance, walking difficulty all other systems negative  PMH:  Past Medical History:  Diagnosis Date   Cerebral infarction ([place]) 07/12/2017   2009 first one, has had a total of 4 stokes   Chronic kidney disease    tues thurs sat - dialysis   Diabetes type 2, controlled ([place])    Hemiparesis affecting right side as late effect of cerebrovascular accident (CVA) ([place])    Hyperlipidemia    Hypertension    Multiple sclerosis ([place]) 2009   Stroke [facility])    Walker as ambulation aid     Social History:  Social History   Socioeconomic History   Marital status: Divorced    Spouse name: Not on file   Number of children: Not on file   Years of education: Not on file   Highest education level: Not on file  Occupational History   Occupation: disabled  Tobacco Use   Smoking status: Never   Smokeless tobacco: Never  Vaping Use   Vaping  Use: Never used  Substance and Sexual Activity   Alcohol use: Not Currently   Drug use: Never   Sexual activity: Not on file  Other Topics Concern   Not on file  Social History Narrative   Not on file   Social Determinants of Health   Financial Resource Strain: Not on file  Food Insecurity: No Food Insecurity (11/10/2020)   Hunger Vital Sign    Worried About Running Out of Food in the Last Year: Never true    Ran Out of Food in the  Last Year: Never true  Transportation Needs: No Transportation Needs (11/10/2020)   PRAPARE - Hydrologist (Medical): No    Lack of Transportation (Non-Medical): No  Physical Activity: Not on file  Stress: Not on file  Social Connections: Not on file  Intimate Partner Violence: Not on file    Medications:   Current Outpatient Medications on File Prior to Visit  Medication Sig Dispense Refill   acetaminophen (TYLENOL) 325 MG tablet Take 650 mg by mouth every 6 (six) hours as needed for moderate pain.     amLODipine (NORVASC) 10 MG tablet Take 1 tablet (10 mg total) by mouth daily. 30 tablet 0   calcium acetate (PHOSLO) 667 MG capsule Take 667 mg by mouth in the morning, at noon, and at bedtime.     carvedilol (COREG) 25 MG tablet Take 1 tablet (25 mg total) by mouth 2 (two) times daily with a meal. 60 tablet 0   clopidogrel (PLAVIX) 75 MG tablet Take 1 tablet (75 mg total) by mouth daily. 30 tablet 0   Continuous Blood Gluc Sensor (FREESTYLE LIBRE 2 SENSOR) MISC USE AS DIRECTED CHANGE EVERY 14 DAYS     diclofenac Sodium (VOLTAREN) 1 % GEL Apply 2 g topically 4 (four) times daily. To leg (Patient taking differently: Apply 2 g topically 4 (four) times daily as needed (pain). To leg) 350 g 0   famotidine (PEPCID) 20 MG tablet Take 1 tablet (20 mg total) by mouth daily. 30 tablet 0   insulin degludec (TRESIBA FLEXTOUCH) 100 UNIT/ML FlexTouch Pen Inject 12 Units into the skin at bedtime. (Patient taking differently: Inject 8-12 Units into the skin at bedtime. Sliding scale) 15 mL 0   insulin lispro (HUMALOG) 100 UNIT/ML KwikPen Inject 5 Units into the skin 3 (three) times daily. (Patient taking differently: Inject 6 Units into the skin 3 (three) times daily before meals.) 15 mL 0   lidocaine-prilocaine (EMLA) cream Apply topically.     metoCLOPramide (REGLAN) 5 MG tablet Take 5 mg by mouth 3 (three) times daily before meals.     nitroGLYCERIN (NITROSTAT) 0.4 MG SL  tablet Place 1 tablet (0.4 mg total) under the tongue every 5 (five) minutes as needed for chest pain. 30 tablet 12   polyethylene glycol (MIRALAX / GLYCOLAX) 17 g packet Take 17 g by mouth daily. (Patient taking differently: Take 17 g by mouth daily as needed for moderate constipation.) 30 each 0   rosuvastatin (CRESTOR) 20 MG tablet Take 20 mg by mouth daily.     senna-docusate (SENOKOT-S) 8.6-50 MG tablet Take 1 tablet by mouth 2 (two) times daily as needed for moderate constipation. 60 tablet 0   tiZANidine (ZANAFLEX) 2 MG tablet Take 1 tablet (2 mg total) by mouth at bedtime. (Patient taking differently: Take 2 mg by mouth in the morning and at bedtime.) 30 tablet 0   No current facility-administered medications on file prior to  visit.    Allergies:   Allergies  Allergen Reactions   Ibuprofen Swelling    Swelling to hands, feet and face    Influenza Vaccines Anaphylaxis and Swelling    fever   Pneumococcal Vaccine Anaphylaxis and Swelling    fever   Promethazine Other (See Comments)    hallucinations   Ambien [Zolpidem] Other (See Comments)    Unknown reaction   Codeine Itching   Cyclobenzaprine Other (See Comments)    hallucinations    Hydrocodone-Acetaminophen Itching   Tape Itching   Tramadol Hcl Other (See Comments)    hallucinations   Wound Dressing Adhesive Itching    Use paper tape   Oxycodone Nausea And Vomiting    Physical Exam General: well developed, well nourished pleasant middle-aged African-American lady, seated, in no evident distress Head: head normocephalic and atraumatic.   Neck: supple with no carotid or supraclavicular bruits Cardiovascular: regular rate and rhythm, no murmurs Musculoskeletal: no deformity Skin:  no rash/petichiae Vascular:  Normal pulses all extremities  Neurologic Exam Mental Status: Awake and fully alert. Oriented to place and time. Recent and remote memory intact. Attention span, concentration and fund of knowledge  appropriate. Mood and affect appropriate.  Positive judgment.  Speech is dysarthric with pseudobulbar features. Cranial Nerves: Fundoscopic exam reveals sharp disc margins. Pupils equal, briskly reactive to light. Extraocular movements full without nystagmus.  Diminished visual acuity bilaterally at baseline.  Visual fields full to confrontation. Hearing intact. Facial sensation intact.  Right lower facial weakness., tongue, palate moves normally and symmetrically.  Motor: Mild spastic right hemiparesis with 4/5 strength on the right with weakness of right grip and intrinsic hand muscles moderate left or right upper extremity.  Mild right hip flexor and ankle dorsiflexor weakness.  Tone is increased on the right.  Sensory.: intact to touch , pinprick , position and vibratory sensation.  Coordination: Impaired right finger-to-nose and [place] and coordination.  Intact coordination on the left.   Gait and Station: Deferred as patient is unable to walk at baseline Reflexes: 2+ and asymmetric and brisker on the right. Toes downgoing.   NIHSS  5 Modified [place]  4   ASSESSMENT: 56 year old African-American lady with history of multiple bilateral subcortical strokes since 2009 with spastic residual right hemiplegia and pseudobulbar state likely from small vessel disease vascular risk factors of hypertension, diabetes and hyperlipidemia.  Patient reported history of multiple sclerosis 20 years ago but no corroborative medical records or medical history to support it     PLAN:I had a long d/w patient and her daughter about her remote strokes and spastic hemiplegia and gait difficulties, risk for recurrent stroke/TIAs, personally independently reviewed imaging studies and stroke evaluation results and answered questions.Continue Plavix 75 mg daily  for secondary stroke prevention and maintain strict control of hypertension with blood pressure goal below 130/90, diabetes with hemoglobin A1c goal below 6.5% and  lipids with LDL cholesterol goal below 70 mg/dL. I also advised the patient to eat a healthy diet with plenty of whole grains, cereals, fruits and vegetables, exercise regularly and maintain ideal body weight .refer for outpatient physical and occupational therapy to improve gait and balance.  Check lipid profile, hemoglobin A1c and screening carotid ultrasound.  Followup in the future with my nurse practitioner in 6 months or call earlier if needed.  Greater than 50% time during this 45-minute consultation visit spent on counseling and coordination of care about her remote strokes and diagnosis of possible multiple sclerosis.  Antony Contras, MD  Note: This document was prepared with digital dictation and possible smart phrase technology. Any transcriptional errors that result from this process are unintentional.

## 2022-04-01 NOTE — Patient Instructions (Addendum)
I had a long d/w patient and her daughter about her remote strokes and spastic hemiplegia and gait difficulties, risk for recurrent stroke/TIAs, personally independently reviewed imaging studies and stroke evaluation results and answered questions.Continue Plavix 75 mg daily  for secondary stroke prevention and maintain strict control of hypertension with blood pressure goal below 130/90, diabetes with hemoglobin A1c goal below 6.5% and lipids with LDL cholesterol goal below 70 mg/dL. I also advised the patient to eat a healthy diet with plenty of whole grains, cereals, fruits and vegetables, exercise regularly and maintain ideal body weight .refer for outpatient physical and occupational therapy to improve gait and balance.  Check lipid profile, hemoglobin A1c and screening carotid ultrasound.  Followup in the future with my nurse practitioner in 6 months or call earlier if needed.  Stroke Prevention Some medical conditions and behaviors can lead to a higher chance of having a stroke. You can help prevent a stroke by eating healthy, exercising, not smoking, and managing any medical conditions you have. Stroke is a leading cause of functional impairment. Primary prevention is particularly important because a majority of strokes are first-time events. Stroke changes the lives of not only those who experience a stroke but also their family and other caregivers. How can this condition affect me? A stroke is a medical emergency and should be treated right away. A stroke can lead to brain damage and can sometimes be life-threatening. If a person gets medical treatment right away, there is a better chance of surviving and recovering from a stroke. What can increase my risk? The following medical conditions may increase your risk of a stroke: Cardiovascular disease. High blood pressure (hypertension). Diabetes. High cholesterol. Sickle cell disease. Blood clotting disorders (hypercoagulable  state). Obesity. Sleep disorders (obstructive sleep apnea). Other risk factors include: Being older than age 90. Having a history of blood clots, stroke, or mini-stroke (transient ischemic attack, TIA). Genetic factors, such as race, ethnicity, or a family history of stroke. Smoking cigarettes or using other tobacco products. Taking birth control pills, especially if you also use tobacco. Heavy use of alcohol or drugs, especially cocaine and methamphetamine. Physical inactivity. What actions can I take to prevent this? Manage your health conditions High cholesterol levels. Eating a healthy diet is important for preventing high cholesterol. If cholesterol cannot be managed through diet alone, you may need to take medicines. Take any prescribed medicines to control your cholesterol as told by your health care provider. Hypertension. To reduce your risk of stroke, try to keep your blood pressure below 130/80. Eating a healthy diet and exercising regularly are important for controlling blood pressure. If these steps are not enough to manage your blood pressure, you may need to take medicines. Take any prescribed medicines to control hypertension as told by your health care provider. Ask your health care provider if you should monitor your blood pressure at home. Have your blood pressure checked every year, even if your blood pressure is normal. Blood pressure increases with age and some medical conditions. Diabetes. Eating a healthy diet and exercising regularly are important parts of managing your blood sugar (glucose). If your blood sugar cannot be managed through diet and exercise, you may need to take medicines. Take any prescribed medicines to control your diabetes as told by your health care provider. Get evaluated for obstructive sleep apnea. Talk to your health care provider about getting a sleep evaluation if you snore a lot or have excessive sleepiness. Make sure that any other  medical conditions  you have, such as atrial fibrillation or atherosclerosis, are managed. Nutrition Follow instructions from your health care provider about what to eat or drink to help manage your health condition. These instructions may include: Reducing your daily calorie intake. Limiting how much salt (sodium) you use to 1,500 milligrams (mg) each day. Using only healthy fats for cooking, such as olive oil, canola oil, or sunflower oil. Eating healthy foods. You can do this by: Choosing foods that are high in fiber, such as whole grains, and fresh fruits and vegetables. Eating at least 5 servings of fruits and vegetables a day. Try to fill one-half of your plate with fruits and vegetables at each meal. Choosing lean protein foods, such as lean cuts of meat, poultry without skin, fish, tofu, beans, and nuts. Eating low-fat dairy products. Avoiding foods that are high in sodium. This can help lower blood pressure. Avoiding foods that have saturated fat, trans fat, and cholesterol. This can help prevent high cholesterol. Avoiding processed and prepared foods. Counting your daily carbohydrate intake.  Lifestyle If you drink alcohol: Limit how much you have to: 0-1 drink a day for women who are not pregnant. 0-2 drinks a day for men. Know how much alcohol is in your drink. In the [place] one drink equals one 12 oz bottle of beer (362mL), one 5 oz glass of wine (176mL), or one 1 oz glass of hard liquor (60mL). Do not use any products that contain nicotine or tobacco. These products include cigarettes, chewing tobacco, and vaping devices, such as e-cigarettes. If you need help quitting, ask your health care provider. Avoid secondhand smoke. Do not use drugs. Activity  Try to stay at a healthy weight. Get at least 30 minutes of exercise on most days, such as: Fast walking. Biking. Swimming. Medicines Take over-the-counter and prescription medicines only as told by your health care  provider. Aspirin or blood thinners (antiplatelets or anticoagulants) may be recommended to reduce your risk of forming blood clots that can lead to stroke. Avoid taking birth control pills. Talk to your health care provider about the risks of taking birth control pills if: You are over 85 years old. You smoke. You get very bad headaches. You have had a blood clot. Where to find more information American Stroke Association: www.strokeassociation.org Get help right away if: You or a loved one has any symptoms of a stroke. "BE FAST" is an easy way to remember the main warning signs of a stroke: B - Balance. Signs are dizziness, sudden trouble walking, or loss of balance. E - Eyes. Signs are trouble seeing or a sudden change in vision. F - Face. Signs are sudden weakness or numbness of the face, or the face or eyelid drooping on one side. A - Arms. Signs are weakness or numbness in an arm. This happens suddenly and usually on one side of the body. S - Speech. Signs are sudden trouble speaking, slurred speech, or trouble understanding what people say. T - Time. Time to call emergency services. Write down what time symptoms started. You or a loved one has other signs of a stroke, such as: A sudden, severe headache with no known cause. Nausea or vomiting. Seizure. These symptoms may represent a serious problem that is an emergency. Do not wait to see if the symptoms will go away. Get medical help right away. Call your local emergency services (911 in the [place] Do not drive yourself to the hospital. Summary You can help to prevent a stroke by eating  healthy, exercising, not smoking, limiting alcohol intake, and managing any medical conditions you may have. Do not use any products that contain nicotine or tobacco. These include cigarettes, chewing tobacco, and vaping devices, such as e-cigarettes. If you need help quitting, ask your health care provider. Remember "BE FAST" for warning signs of a  stroke. Get help right away if you or a loved one has any of these signs. This information is not intended to replace advice given to you by your health care provider. Make sure you discuss any questions you have with your health care provider. Document Revised: 12/03/2019 Document Reviewed: 12/03/2019 Elsevier Patient Education  [place].

## 2022-04-02 LAB — LIPID PANEL
Chol/HDL Ratio: 1.7 ratio (ref 0.0–4.4)
Cholesterol, Total: 133 mg/dL (ref 100–199)
HDL: 80 mg/dL (ref >39)
LDL Chol Calc (NIH): 41 mg/dL (ref 0–99)
Triglycerides: 52 mg/dL (ref 0–149)
VLDL Cholesterol Cal: 12 mg/dL (ref 5–40)

## 2022-04-02 LAB — HEMOGLOBIN A1C
Est. average glucose Bld gHb Est-mCnc: 169 mg/dL
Hgb A1c MFr Bld: 7.5 % — ABNORMAL HIGH (ref 4.8–5.6)

## 2022-04-02 NOTE — Progress Notes (Signed)
Kindly inform the patient that cholesterol profile is satisfactory.  Screening test for diabetes hemoglobin A1c is still high at 7.5 but improved from a year ago.  Continue to work with primary care physician for better diabetes control

## 2022-04-05 ENCOUNTER — Ambulatory Visit (HOSPITAL_COMMUNITY)
Admission: RE | Admit: 2022-04-05 | Discharge: 2022-04-05 | Disposition: A | Discharge status: Home / Self Care | Payer: Medicare Other | Source: Ambulatory Visit | Attending: Family Medicine | Admitting: Family Medicine

## 2022-04-05 ENCOUNTER — Ambulatory Visit (HOSPITAL_COMMUNITY): Payer: Medicare Other

## 2022-04-05 DIAGNOSIS — R471 Dysarthria and anarthria: Secondary | ICD-10-CM

## 2022-04-05 DIAGNOSIS — R1310 Dysphagia, unspecified: Secondary | ICD-10-CM

## 2022-04-05 DIAGNOSIS — R490 Dysphonia: Secondary | ICD-10-CM

## 2022-04-09 ENCOUNTER — Telehealth (HOSPITAL_COMMUNITY): Payer: Self-pay

## 2022-04-09 NOTE — Telephone Encounter (Signed)
Attempted to contact patient to reschedule Modified Barium Swallow (no show on 11/20) - left voicemail.

## 2022-04-13 ENCOUNTER — Ambulatory Visit: Payer: Medicare Other | Admitting: Physical Therapy

## 2022-04-13 ENCOUNTER — Ambulatory Visit: Payer: Medicare Other | Admitting: Speech Pathology

## 2022-04-16 DIAGNOSIS — D631 Anemia in chronic kidney disease: Secondary | ICD-10-CM | POA: Diagnosis not present

## 2022-04-16 DIAGNOSIS — D689 Coagulation defect, unspecified: Secondary | ICD-10-CM | POA: Diagnosis not present

## 2022-04-16 DIAGNOSIS — N2581 Secondary hyperparathyroidism of renal origin: Secondary | ICD-10-CM | POA: Diagnosis not present

## 2022-04-16 DIAGNOSIS — R519 Headache, unspecified: Secondary | ICD-10-CM | POA: Diagnosis not present

## 2022-04-16 DIAGNOSIS — E1122 Type 2 diabetes mellitus with diabetic chronic kidney disease: Secondary | ICD-10-CM | POA: Diagnosis not present

## 2022-04-16 DIAGNOSIS — D509 Iron deficiency anemia, unspecified: Secondary | ICD-10-CM | POA: Diagnosis not present

## 2022-04-16 DIAGNOSIS — Z992 Dependence on renal dialysis: Secondary | ICD-10-CM | POA: Diagnosis not present

## 2022-04-16 DIAGNOSIS — N186 End stage renal disease: Secondary | ICD-10-CM | POA: Diagnosis not present

## 2022-04-19 DIAGNOSIS — D509 Iron deficiency anemia, unspecified: Secondary | ICD-10-CM | POA: Diagnosis not present

## 2022-04-19 DIAGNOSIS — N2581 Secondary hyperparathyroidism of renal origin: Secondary | ICD-10-CM | POA: Diagnosis not present

## 2022-04-19 DIAGNOSIS — Z992 Dependence on renal dialysis: Secondary | ICD-10-CM | POA: Diagnosis not present

## 2022-04-19 DIAGNOSIS — D689 Coagulation defect, unspecified: Secondary | ICD-10-CM | POA: Diagnosis not present

## 2022-04-19 DIAGNOSIS — R519 Headache, unspecified: Secondary | ICD-10-CM | POA: Diagnosis not present

## 2022-04-19 DIAGNOSIS — E1122 Type 2 diabetes mellitus with diabetic chronic kidney disease: Secondary | ICD-10-CM | POA: Diagnosis not present

## 2022-04-19 DIAGNOSIS — N186 End stage renal disease: Secondary | ICD-10-CM | POA: Diagnosis not present

## 2022-04-19 DIAGNOSIS — D631 Anemia in chronic kidney disease: Secondary | ICD-10-CM | POA: Diagnosis not present

## 2022-04-20 ENCOUNTER — Ambulatory Visit: Payer: Medicare Other | Admitting: Speech Pathology

## 2022-04-21 DIAGNOSIS — D631 Anemia in chronic kidney disease: Secondary | ICD-10-CM | POA: Diagnosis not present

## 2022-04-21 DIAGNOSIS — R519 Headache, unspecified: Secondary | ICD-10-CM | POA: Diagnosis not present

## 2022-04-21 DIAGNOSIS — D689 Coagulation defect, unspecified: Secondary | ICD-10-CM | POA: Diagnosis not present

## 2022-04-21 DIAGNOSIS — E1122 Type 2 diabetes mellitus with diabetic chronic kidney disease: Secondary | ICD-10-CM | POA: Diagnosis not present

## 2022-04-21 DIAGNOSIS — N2581 Secondary hyperparathyroidism of renal origin: Secondary | ICD-10-CM | POA: Diagnosis not present

## 2022-04-21 DIAGNOSIS — Z992 Dependence on renal dialysis: Secondary | ICD-10-CM | POA: Diagnosis not present

## 2022-04-21 DIAGNOSIS — D509 Iron deficiency anemia, unspecified: Secondary | ICD-10-CM | POA: Diagnosis not present

## 2022-04-21 DIAGNOSIS — N186 End stage renal disease: Secondary | ICD-10-CM | POA: Diagnosis not present

## 2022-04-22 ENCOUNTER — Ambulatory Visit: Payer: Medicare Other

## 2022-04-22 ENCOUNTER — Ambulatory Visit: Payer: Medicare Other | Attending: Family Medicine | Admitting: Occupational Therapy

## 2022-04-22 ENCOUNTER — Ambulatory Visit (HOSPITAL_COMMUNITY)
Admission: RE | Admit: 2022-04-22 | Discharge: 2022-04-22 | Disposition: A | Discharge status: Home / Self Care | Payer: Medicare Other | Source: Ambulatory Visit | Attending: Family Medicine | Admitting: Family Medicine

## 2022-04-22 DIAGNOSIS — M6281 Muscle weakness (generalized): Secondary | ICD-10-CM

## 2022-04-22 DIAGNOSIS — M25621 Stiffness of right elbow, not elsewhere classified: Secondary | ICD-10-CM

## 2022-04-22 DIAGNOSIS — E1122 Type 2 diabetes mellitus with diabetic chronic kidney disease: Secondary | ICD-10-CM | POA: Insufficient documentation

## 2022-04-22 DIAGNOSIS — M25611 Stiffness of right shoulder, not elsewhere classified: Secondary | ICD-10-CM | POA: Diagnosis not present

## 2022-04-22 DIAGNOSIS — R278 Other lack of coordination: Secondary | ICD-10-CM

## 2022-04-22 DIAGNOSIS — R2689 Other abnormalities of gait and mobility: Secondary | ICD-10-CM | POA: Insufficient documentation

## 2022-04-22 DIAGNOSIS — R41840 Attention and concentration deficit: Secondary | ICD-10-CM | POA: Diagnosis present

## 2022-04-22 DIAGNOSIS — R262 Difficulty in walking, not elsewhere classified: Secondary | ICD-10-CM | POA: Insufficient documentation

## 2022-04-22 DIAGNOSIS — R1312 Dysphagia, oropharyngeal phase: Secondary | ICD-10-CM | POA: Insufficient documentation

## 2022-04-22 DIAGNOSIS — R2681 Unsteadiness on feet: Secondary | ICD-10-CM

## 2022-04-22 DIAGNOSIS — E785 Hyperlipidemia, unspecified: Secondary | ICD-10-CM | POA: Insufficient documentation

## 2022-04-22 DIAGNOSIS — I120 Hypertensive chronic kidney disease with stage 5 chronic kidney disease or end stage renal disease: Secondary | ICD-10-CM | POA: Insufficient documentation

## 2022-04-22 DIAGNOSIS — R42 Dizziness and giddiness: Secondary | ICD-10-CM

## 2022-04-22 DIAGNOSIS — R1310 Dysphagia, unspecified: Secondary | ICD-10-CM | POA: Diagnosis not present

## 2022-04-22 DIAGNOSIS — Z955 Presence of coronary angioplasty implant and graft: Secondary | ICD-10-CM | POA: Diagnosis not present

## 2022-04-22 DIAGNOSIS — G35 Multiple sclerosis: Secondary | ICD-10-CM | POA: Diagnosis not present

## 2022-04-22 NOTE — Therapy (Signed)
OUTPATIENT OCCUPATIONAL THERAPY NEURO EVALUATION  Patient Name: Hannah [name] MRN: [id number] DOB:[birth year], 56 y.o., female Today's Date: 04/22/2022  PCP: Dr. Zigmund Daniel REFERRING PROVIDER: Dr. Leonie Man  END OF SESSION:  OT End of Session - 04/22/22 1304     Visit Number 1    Number of Visits 17    Date for OT Re-Evaluation 06/24/22    Authorization Type [employer] Medicare    Authorization Time Period PN every 10th visit    Authorization - Visit Number 1    Authorization - Number of Visits 10    Progress Note Due on Visit 10    OT Start Time 1405    OT Stop Time 1445    OT Time Calculation (min) 40 min    Activity Tolerance Patient tolerated treatment well    Behavior During Therapy [facility] for tasks assessed/performed             Past Medical History:  Diagnosis Date   Cerebral infarction ([place]) 07/12/2017   2009 first one, has had a total of 4 stokes   Chronic kidney disease    tues thurs sat - dialysis   Diabetes type 2, controlled ([place])    Hemiparesis affecting right side as late effect of cerebrovascular accident (CVA) ([place])    Hyperlipidemia    Hypertension    Multiple sclerosis ([place]) 2009   Stroke [facility])    Walker as ambulation aid    Past Surgical History:  Procedure Laterality Date   A/V FISTULAGRAM Right 02/12/2021   Procedure: A/V FISTULAGRAM;  Surgeon: Marty Heck, MD;  Location: [facility];  Service: Cardiovascular;  Laterality: Right;   APPENDECTOMY     AV FISTULA PLACEMENT Right 10/02/2020   Procedure: RIGHT ARTERIOVENOUS (AV) FISTULA CREATION;  Surgeon: Waynetta Sandy, MD;  Location: [facility];  Service: Vascular;  Laterality: Right;   [place] Right 11/24/2020   Procedure: SECOND STAGE RIGHT ARM [place];  Surgeon: Marty Heck, MD;  Location: [facility];  Service: Vascular;  Laterality: Right;   CYSTOSTOMY W/ BLADDER BIOPSY     IR FLUORO GUIDE CV LINE RIGHT  09/29/2020   IR US GUIDE VASC ACCESS  RIGHT  09/29/2020   OVARIAN CYST SURGERY     PERIPHERAL VASCULAR BALLOON ANGIOPLASTY Right 02/12/2021   Procedure: PERIPHERAL VASCULAR BALLOON ANGIOPLASTY;  Surgeon: Marty Heck, MD;  Location: [facility];  Service: Cardiovascular;  Laterality: Right;  UPPER ARM FISTULA   PERIPHERAL VASCULAR BALLOON ANGIOPLASTY Right 01/28/2022   Procedure: PERIPHERAL VASCULAR BALLOON ANGIOPLASTY;  Surgeon: Marty Heck, MD;  Location: [facility];  Service: Cardiovascular;  Laterality: Right;  Arm fistula   stent placed in heart     Patient Active Problem List   Diagnosis Date Noted   Long term (current) use of insulin ([place]) 01/14/2022   Chronic constipation 06/09/2021   Altered mental status, unspecified 01/12/2021   Other chronic postprocedural pain 01/09/2021   Past heart attack 11/13/2020   Spastic hemiplegia due to cerebrovascular disease ([place]) 11/03/2020   Debility 10/18/2020   ESRD on hemodialysis ([facility])    SOB (shortness of breath) 09/27/2020   Volume overload 09/26/2020   CKD (chronic kidney disease) stage 5, GFR less than 15 ml/min ([place]) 09/13/2020   H/O: CVA (cerebrovascular accident) 09/12/2020   Acute blood loss anemia    Chronic kidney disease (CKD), stage IV (severe) ([place])    Uncontrolled type 2 diabetes mellitus with hyperglycemia ([place])  Essential hypertension    Right pontine cerebrovascular accident ([place]) 06/12/2020   Stroke-like symptoms 06/09/2020   Acute kidney injury superimposed on CKD ([place]) 06/09/2020   Cerebral thrombosis with cerebral infarction 06/09/2020   Hypertension    Hyperlipidemia    Diabetes type 2, controlled ([place])    Bimalleolar ankle fracture 11/24/2018   Lower abdominal pain 07/28/2017   Bladder mass 07/18/2017   Painless hematuria 07/18/2017   Intractable vomiting with nausea 07/12/2017   Hemiparesis affecting right side as late effect of cerebrovascular accident (CVA) ([place]) 09/22/2016   AKI (acute kidney injury) ([place])  01/08/2016   CAD (coronary artery disease) 01/07/2016   Multiple sclerosis ([place]) 2009    ONSET DATE: 04/02/23  REFERRING DIAG: I67.9,G81.10 (ICD-10-CM) - Spastic hemiplegia due to cerebrovascular disease   THERAPY DIAG:  Muscle weakness (generalized)  Other lack of coordination  Unsteadiness on feet  Attention and concentration deficit  Stiffness of right shoulder, not elsewhere classified  Stiffness of right elbow, not elsewhere classified  Rationale for Evaluation and Treatment: Rehabilitation  SUBJECTIVE:   SUBJECTIVE STATEMENT: Pt reports she saw her neurologist and he recommended more therapy, last CVA was 2022 Pt accompanied by: self  PERTINENT HISTORY: 56 y.o. female  Past medical history: end-stage renal disease on hemodialysis, hypertension, hyperlipidemia, diabetes mellitus type 2, unspecified multiple CVA's with residual right-sided hemiparesis and some speech difficulties,  previous diagnosis of multiple sclerosis how ever now MD believes it may be multiple TIA, chronic right upper extremity pain, placed on methadone. ESRD with dialysis  PRECAUTIONS: Other: ESRD with dialysis M/W/F. No BP in RUE due to dialysis graft site  WEIGHT BEARING RESTRICTIONS: No  PAIN:  Are you having pain? No  FALLS: Has patient fallen in last 6 months? No  LIVING ENVIRONMENT: Lives with: lives with their family Lives in: House/apartment  Has following equipment at home: Wheelchair (manual)  PLOF: Needs assistance with ADLs and Needs assistance with gait  PATIENT GOALS: pt wants to be able to cook  OBJECTIVE:   HAND DOMINANCE: Right, however pt only uses RUE 25-30% of the time  ADLs: Overall ADLs: increased time required Transfers/ambulation related to ADLs:needs assist Eating: needs assist with cutting up food Grooming: needs assist for styling hair UB Dressing: needs assist with donning shirt LB Dressing: max A Toileting: min A with walker per pt Bathing: min A  using shower bench Tub Shower transfers: min A with transfer Equipment: Transfer tub bench  IADLs: Shopping: dependent Light housekeeping: dependent Meal Prep: dependent Community mobility: w/c Medication management: dtr Medical laboratory scientific officer: dependent Handwriting: 100% legible  MOBILITY STATUS: Needs Assist: with RW   ACTIVITY TOLERANCE: Activity tolerance: Pt reports she fatigues after standing for several mins    UPPER EXTREMITY ROM:    Active ROM Right eval Left eval  Shoulder flexion 85   Shoulder abduction    Shoulder adduction    Shoulder extension    Shoulder internal rotation    Shoulder external rotation    Elbow flexion 140    Elbow extension -45   Wrist flexion    Wrist extension    Wrist ulnar deviation    Wrist radial deviation    Wrist pronation    Wrist supination 90%      Full finger flexion present  HAND FUNCTION: Grip strength: Right: 18.5 lbs; Left: 36.1 lbs  COORDINATION: 9 Hole Peg test: Right: 1 min 54 secs  sec; Left: 33.57 sec  SENSATION: WFL    MUSCLE TONE: RUE: Moderate  and Hypertonic  COGNITION: Overall cognitive status: Impaired, short term memory deficits  VISION: Subjective report: Blurry vision Baseline vision: needs glasses for reading   VISION ASSESSMENT: Not tested, pt reports blurry vision, to be assessed in functional context  Patient has difficulty with following activities due to following visual impairments: difficulty reading due to visual deficits      TODAY'S TREATMENT:                                                                                                                              DATE: n/a   PATIENT EDUCATION: Education details: role of OT, potential OT goals Person educated: Patient Education method: Explanation Education comprehension: verbalized understanding  HOME EXERCISE PROGRAM: N/a   GOALS: Goals reviewed with patient? Yes  SHORT TERM GOALS: Target date:  05/22/22  I with initial HEP Baseline:dependent Goal status: INITIAL  2.  Pt will donn pullover shirt with min A Baseline: max A Goal status: INITIAL  3.  Pt will fold laundry with set up Baseline: dependent Goal status: INITIAL  4. Pt will verbalize understanding of adapted strategies/ AE and compensations for cognitive and visual deficits to increase pt. Safety and I with ADLs/IADLs. Baseline: dependent Goal status: initial     LONG TERM GOALS: Target date: 06/24/22  Pt will demonstrate improved fine motor coordination as evidenced by decreasing RUE 9 hole peg test to 1 min 52 secs Baseline: 9 Hole Peg test: Right: 1 min 54 secs  sec; Left: 33.57 sec Goal status: INITIAL  2.  Pt will demonstrate ability to stand for 10 mins with close supervision for ADLs. Baseline: standing for 2-3 mins per pt Goal status: INITIAL  3.  Pt will report using her RUE to assist with ADLs/ IADLS at least 82% of the time Baseline: 25-30% Goal status: INITIAL  4.  Pt will perform simple snack prep with min A. Baseline: dependent Goal status: INITIAL   ASSESSMENT:  CLINICAL IMPRESSION: Patient is a 55 y.o. female who was seen today for occupational therapy evaluation for I67.9,G81.10 (ICD-10-CM) - Spastic hemiplegia due to cerebrovascular disease . 56 y.o. female  Past medical history: end-stage renal disease on hemodialysis, hypertension, hyperlipidemia, diabetes mellitus type 2, unspecified multiple CVA's with residual right-sided hemiparesis and some speech difficulties,  previous diagnosis of multiple sclerosis how ever now MD believes it may be multiple TIA, chronic right upper extremity pain, placed on methadone. ESRD with dialysis. Most recent CVA was 2022.  PERFORMANCE DEFICITS: in functional skills including ADLs, IADLs, coordination, dexterity, tone, ROM, strength, mobility, endurance, decreased knowledge of precautions, decreased knowledge of use of DME, vision, and UE functional use,  cognitive skills including attention, memory, problem solving, safety awareness, thought, and understand, and psychosocial skills including coping strategies, environmental adaptation, habits, interpersonal interactions, and routines and behaviors.   IMPAIRMENTS: are limiting patient from ADLs, IADLs, education, play, leisure, and social participation.   CO-MORBIDITIES: may have co-morbidities  that affects  occupational performance. Patient will benefit from skilled OT to address above impairments and improve overall function.  MODIFICATION OR ASSISTANCE TO COMPLETE EVALUATION: No modification of tasks or assist necessary to complete an evaluation.  OT OCCUPATIONAL PROFILE AND HISTORY: Detailed assessment: Review of records and additional review of physical, cognitive, psychosocial history related to current functional performance.  CLINICAL DECISION MAKING: LOW - limited treatment options, no task modification necessary  REHAB POTENTIAL: Good  EVALUATION COMPLEXITY: Low    PLAN:  OT FREQUENCY: 2x/week  OT DURATION: 8 weeks plus eval  PLANNED INTERVENTIONS: self care/ADL training, therapeutic exercise, therapeutic activity, neuromuscular re-education, manual therapy, passive range of motion, gait training, balance training, functional mobility training, aquatic therapy, splinting, electrical stimulation, ultrasound, paraffin, fluidotherapy, moist heat, cryotherapy, contrast bath, patient/family education, cognitive remediation/compensation, visual/perceptual remediation/compensation, energy conservation, coping strategies training, DME and/or AE instructions, and Re-evaluation  RECOMMENDED OTHER SERVICES: PT  CONSULTED AND AGREED WITH PLAN OF CARE: Patient  PLAN FOR NEXT SESSION: HEP for RUE functional use, simple ADL strategies   Peter Daquila, OT 04/22/2022, 4:37 PM

## 2022-04-22 NOTE — Therapy (Signed)
OUTPATIENT PHYSICAL THERAPY NEURO EVALUATION   Patient Name: Hannah [name] MRN: [id number] DOB:[birth year], 56 y.o., female Today's Date: 04/22/2022   PCP: Faustino Congress NP REFERRING PROVIDER: Garvin Fila, MD  END OF SESSION:  PT End of Session - 04/22/22 1319     Visit Number 1    Number of Visits 17    Authorization Type U[employer] Medicare - 10th visit PN    PT Start Time 1950    PT Stop Time 1403    PT Time Calculation (min) 46 min    Equipment Utilized During Treatment Gait belt    Activity Tolerance Patient tolerated treatment well    Behavior During Therapy WFL for tasks assessed/performed             Past Medical History:  Diagnosis Date   Cerebral infarction ([place]) 07/12/2017   2009 first one, has had a total of 4 stokes   Chronic kidney disease    tues thurs sat - dialysis   Diabetes type 2, controlled ([place])    Hemiparesis affecting right side as late effect of cerebrovascular accident (CVA) ([place])    Hyperlipidemia    Hypertension    Multiple sclerosis ([place]) 2009   Stroke [facility])    Walker as ambulation aid    Past Surgical History:  Procedure Laterality Date   A/V FISTULAGRAM Right 02/12/2021   Procedure: A/V FISTULAGRAM;  Surgeon: Marty Heck, MD;  Location: [facility] [facility];  Service: Cardiovascular;  Laterality: Right;   APPENDECTOMY     AV FISTULA PLACEMENT Right 10/02/2020   Procedure: RIGHT ARTERIOVENOUS (AV) FISTULA CREATION;  Surgeon: Waynetta Sandy, MD;  Location: [facility];  Service: Vascular;  Laterality: Right;   [place] Right 11/24/2020   Procedure: SECOND STAGE RIGHT ARM [place];  Surgeon: Marty Heck, MD;  Location: [facility];  Service: Vascular;  Laterality: Right;   CYSTOSTOMY W/ BLADDER BIOPSY     IR FLUORO GUIDE CV LINE RIGHT  09/29/2020   IR US GUIDE VASC ACCESS RIGHT  09/29/2020   OVARIAN CYST SURGERY     PERIPHERAL VASCULAR BALLOON ANGIOPLASTY Right 02/12/2021    Procedure: PERIPHERAL VASCULAR BALLOON ANGIOPLASTY;  Surgeon: Marty Heck, MD;  Location: [place] [facility];  Service: Cardiovascular;  Laterality: Right;  UPPER ARM FISTULA   PERIPHERAL VASCULAR BALLOON ANGIOPLASTY Right 01/28/2022   Procedure: PERIPHERAL VASCULAR BALLOON ANGIOPLASTY;  Surgeon: Marty Heck, MD;  Location: [facility] [place] [facility];  Service: Cardiovascular;  Laterality: Right;  Arm fistula   stent placed in heart     Patient Active Problem List   Diagnosis Date Noted   Long term (current) use of insulin ([place]) 01/14/2022   Chronic constipation 06/09/2021   Altered mental status, unspecified 01/12/2021   Other chronic postprocedural pain 01/09/2021   Past heart attack 11/13/2020   Spastic hemiplegia due to cerebrovascular disease ([place]) 11/03/2020   Debility 10/18/2020   ESRD on hemodialysis (HCC)    SOB (shortness of breath) 09/27/2020   Volume overload 09/26/2020   CKD (chronic kidney disease) stage 5, GFR less than 15 ml/min ([place]) 09/13/2020   H/O: CVA (cerebrovascular accident) 09/12/2020   Acute blood loss anemia    Chronic kidney disease (CKD), stage IV (severe) ([place])    Uncontrolled type 2 diabetes mellitus with hyperglycemia ([place])    Essential hypertension    Right pontine cerebrovascular accident ([place]) 06/12/2020   Stroke-like symptoms 06/09/2020   Acute kidney injury superimposed on CKD ([place]) 06/09/2020  Cerebral thrombosis with cerebral infarction 06/09/2020   Hypertension    Hyperlipidemia    Diabetes type 2, controlled ([place])    Bimalleolar ankle fracture 11/24/2018   Lower abdominal pain 07/28/2017   Bladder mass 07/18/2017   Painless hematuria 07/18/2017   Intractable vomiting with nausea 07/12/2017   Hemiparesis affecting right side as late effect of cerebrovascular accident (CVA) ([place]) 09/22/2016   AKI (acute kidney injury) ([place]) 01/08/2016   CAD (coronary artery disease) 01/07/2016   Multiple sclerosis ([place]) 2009    ONSET DATE:  04/01/2022  REFERRING DIAG:  I67.9,G81.10 (ICD-10-CM) - Spastic hemiplegia due to cerebrovascular disease ([place])    THERAPY DIAG:  Difficulty in walking, not elsewhere classified - Plan: PT plan of care cert/re-cert  Dizziness and giddiness - Plan: PT plan of care cert/re-cert  Muscle weakness (generalized) - Plan: PT plan of care cert/re-cert  Other abnormalities of gait and mobility - Plan: PT plan of care cert/re-cert  Other lack of coordination - Plan: PT plan of care cert/re-cert  Unsteadiness on feet - Plan: PT plan of care cert/re-cert  Rationale for Evaluation and Treatment: Rehabilitation  SUBJECTIVE:                                                                                                                                                                                             SUBJECTIVE STATEMENT: Patient arriving to clinic in custom manual wheelchair. Last received PT a year ago. Reports no major medical changes in the past year. Per patient, Neuro MD sending patient to therapy to improve QOL. Reports intermittent dizziness with positional changes (supine -> sit and sit-> stand).  Pt accompanied by: self  PERTINENT HISTORY: MS (?), 4 previous CVAs, HTN, HLD, DM2  PAIN:  Are you having pain? No  PRECAUTIONS: Fall  WEIGHT BEARING RESTRICTIONS: No  FALLS: Has patient fallen in last 6 months? No  LIVING ENVIRONMENT: Lives with: lives with their family Lives in: House/apartment Stairs:  ~4" Threshold which patients dtr bumps her up in wheelchair Has following equipment at home: Single point cane, Walker - 2 wheeled, Wheelchair (manual), Manufacturing engineer  PLOF: Needs assistance with ADLs, Needs assistance with gait, and Needs assistance with transfers  PATIENT GOALS: "to be able to stand up by myself, to get out of bed and walk to the kitchen myself"  OBJECTIVE:   DIAGNOSTIC FINDINGS: 01/12/21 Brain MRI: 1. No acute intracranial abnormality. 2. Numerous  foci of chronic microhemorrhage in a predominantly central distribution, most consistent with chronic hypertensive angiopathy.  COGNITION: Overall cognitive status: Within functional limits for tasks assessed   SENSATION: WFL  COORDINATION:  Slightly dysmetric heel/shin B LE (R> L)  Figure 8 dysmetric B (R>L)  Dysdiadochokinesia     MUSCLE TONE:  -MAS R hamstring 2+  -MAS R quad 2+  2-3 beats Clonus B   POSTURE: rounded shoulders, forward head, increased thoracic kyphosis, posterior pelvic tilt, and flexed trunk    BED MOBILITY:  Patient reports requiring intermittent assistance from dtr; does not use a hospital bed  TRANSFERS: Assistive device utilized: Environmental consultant - 2 wheeled  Sit to stand: CGA Stand to sit: CGA Chair to chair: CGA   GAIT: Gait pattern: step through pattern, decreased arm swing- Right, decreased arm swing- Left, decreased hip/knee flexion- Right, decreased hip/knee flexion- Left, decreased ankle dorsiflexion- Right, decreased ankle dorsiflexion- Left, Right foot flat, Left foot flat, shuffling, scissoring, ataxic, and narrow BOS Distance walked: clinic Assistive device utilized: Environmental consultant - 2 wheeled Level of assistance: CGA  FUNCTIONAL TESTS:  Timed up and go (TUG): 1'20" with RW + CGA  PATIENT SURVEYS:  Stroke Impact Scale 133  TODAY'S TREATMENT:                                                                                                                              N/a eval   PATIENT EDUCATION: Education details: PT POC, exam findings, BEFAST Person educated: Patient Education method: Explanation and Handouts Education comprehension: verbalized understanding  HOME EXERCISE PROGRAM: To be provided  GOALS: Goals reviewed with patient? Yes  SHORT TERM GOALS: Target date: 05/21/22  Pt will be independent with initial HEP for improved strength and function  Baseline: to be provided Goal status: INITIAL  2.  Pt will improve TUG to </= 60  secs to demonstrated reduced fall risk  Baseline: 1'20" with RW Goal status: INITIAL  3.  BBS to be assessed and goal written as appropriate Baseline: to be assessed Goal status: INITIAL  4.  Gait speed to be assessed and goal written as appropriate Baseline: to be assessed Goal status: INITIAL  5.  Patient will improve SS-QOL score to >/= 140 Baseline: 133 Goal status: INITIAL   LONG TERM GOALS: Target date: 06/18/22  Pt will be independent with final HEP for improved strength and function  Baseline: to be provided Goal status: INITIAL  2.  Pt will improve TUG to </= 45 secs to demonstrated reduced fall risk  Baseline: 1'20" with RW Goal status: INITIAL  3.  BBS to be assessed  Baseline: to be assessed Goal status: INITIAL  4.  Gait speed to be assessed Baseline: to be assessed Goal status: INITIAL  5.  Patient will improve SS-QOL score to >/= 150 to demonstrate improved perceived QOL Baseline: 133 Goal status: INITIAL    ASSESSMENT:  CLINICAL IMPRESSION: Patient is a 56 y.o. female who was seen today for physical therapy evaluation and treatment for gait and strength deficits following multiple CVAs and distant h/o MS. Patient essentially wheelchair/bed bound requiring assist from her daughter for transfers. Reports being  able to ambulate short household distances at home. Gait is ataxic in nature L >R. Patient completed the Timed Up and Go test (TUG) in 80 seconds.  Geriatrics: need for further assessment of fall risk: ? 12 sec; Recurrent falls: > 15 sec; Vestibular Disorders fall risk: > 15 sec; Parkinson's Disease fall risk: > 16 sec ([email or web address], [email or web address]). Patient would benefit from skilled PT services to address the above mentioned deficits.   OBJECTIVE IMPAIRMENTS: Abnormal gait, decreased balance, decreased coordination, decreased endurance, decreased knowledge of condition, decreased knowledge of use of DME, decreased mobility, difficulty walking, decreased  strength, dizziness, impaired tone, impaired UE functional use, and impaired vision/preception.   ACTIVITY LIMITATIONS: carrying, standing, squatting, stairs, transfers, bed mobility, bathing, hygiene/grooming, and locomotion level  PARTICIPATION LIMITATIONS: meal prep, interpersonal relationship, driving, shopping, community activity, and church  PERSONAL FACTORS: Behavior pattern, Fitness, Past/current experiences, Sex, Social background, Time since onset of injury/illness/exacerbation, Transportation, and 3+ comorbidities: MS, HTN, HLD, DM2  are also affecting patient's functional outcome.   REHAB POTENTIAL: Good  CLINICAL DECISION MAKING: Evolving/moderate complexity  EVALUATION COMPLEXITY: Moderate  PLAN:  PT FREQUENCY: 2x/week  PT DURATION: 8 weeks  PLANNED INTERVENTIONS: Therapeutic exercises, Therapeutic activity, Neuromuscular re-education, Balance training, Gait training, Patient/Family education, Self Care, Joint mobilization, Stair training, Vestibular training, Visual/preceptual remediation/compensation, Orthotic/Fit training, DME instructions, Electrical stimulation, Wheelchair mobility training, Manual therapy, and Re-evaluation  PLAN FOR NEXT SESSION: HEP, gait speed, berg    Debbora Dus, PT Debbora Dus, PT, DPT, CBIS  04/22/2022, 3:15 PM

## 2022-04-22 NOTE — Therapy (Signed)
Modified Barium Swallow Progress Note  Patient Details  Name: Hannah [name] MRN: [id number] Date of Birth: 04-04-1966  Today's Date: 04/22/2022  Modified Barium Swallow completed.  Full report located under Chart Review in the Imaging Section.  Brief recommendations include the following:  Clinical Impression Pt presents with functional oropharyngeal swallow. Oral prep of multiple consistencies was timely, without post-swallow oral residue. Pt reported after the study that she was "pausing" before the swallow, which is what she does at home. Pharyngeal swallow is characterized by timely swallow reflex.   NO penetration and NO aspiration across consistencies. Intermittent trace pyriform sinus residue was noted, however, pt sensed this residue and swallowed again to clear it. 33mm Barium Tablet was noted to pause in the cervical esophagus. Neither dry swallow nor liquid wash was successful to clear the tablet, however, a bolus of puree effectively cleared the cervical esophagus. Esophageal sweep revealed it to be clear.  Recommend regular diet/thin liquids. Meds should be followed bu a bolus of puree or solid food to facilitate esophageal clearing. OP SLP informed of results and recommendations.      Swallow Evaluation Recommendations  SLP Diet Recommendations: Regular solids;Thin liquid   Liquid Administration via: Cup;Straw   Medication Administration: Whole meds with liquid (followed by bolus of puree to clear the esophagus.)   Supervision: Patient able to self feed;Intermittent supervision to cue for compensatory strategies   Compensations: Slow rate;Small sips/bites   Postural Changes: Seated upright at 90 degrees   Oral Care Recommendations: Oral care QID     Malene Blaydes B. Quentin Ore, [facility], [place] Speech Language Pathologist Office: [phone]  Shonna Chock 04/22/2022,3:19 PM

## 2022-04-23 DIAGNOSIS — D509 Iron deficiency anemia, unspecified: Secondary | ICD-10-CM | POA: Diagnosis not present

## 2022-04-23 DIAGNOSIS — R519 Headache, unspecified: Secondary | ICD-10-CM | POA: Diagnosis not present

## 2022-04-23 DIAGNOSIS — D689 Coagulation defect, unspecified: Secondary | ICD-10-CM | POA: Diagnosis not present

## 2022-04-23 DIAGNOSIS — Z992 Dependence on renal dialysis: Secondary | ICD-10-CM | POA: Diagnosis not present

## 2022-04-23 DIAGNOSIS — N2581 Secondary hyperparathyroidism of renal origin: Secondary | ICD-10-CM | POA: Diagnosis not present

## 2022-04-23 DIAGNOSIS — N186 End stage renal disease: Secondary | ICD-10-CM | POA: Diagnosis not present

## 2022-04-23 DIAGNOSIS — D631 Anemia in chronic kidney disease: Secondary | ICD-10-CM | POA: Diagnosis not present

## 2022-04-23 DIAGNOSIS — E1122 Type 2 diabetes mellitus with diabetic chronic kidney disease: Secondary | ICD-10-CM | POA: Diagnosis not present

## 2022-04-26 DIAGNOSIS — D509 Iron deficiency anemia, unspecified: Secondary | ICD-10-CM | POA: Diagnosis not present

## 2022-04-26 DIAGNOSIS — N2581 Secondary hyperparathyroidism of renal origin: Secondary | ICD-10-CM | POA: Diagnosis not present

## 2022-04-26 DIAGNOSIS — E1122 Type 2 diabetes mellitus with diabetic chronic kidney disease: Secondary | ICD-10-CM | POA: Diagnosis not present

## 2022-04-26 DIAGNOSIS — D689 Coagulation defect, unspecified: Secondary | ICD-10-CM | POA: Diagnosis not present

## 2022-04-26 DIAGNOSIS — R519 Headache, unspecified: Secondary | ICD-10-CM | POA: Diagnosis not present

## 2022-04-26 DIAGNOSIS — D631 Anemia in chronic kidney disease: Secondary | ICD-10-CM | POA: Diagnosis not present

## 2022-04-26 DIAGNOSIS — N186 End stage renal disease: Secondary | ICD-10-CM | POA: Diagnosis not present

## 2022-04-26 DIAGNOSIS — Z992 Dependence on renal dialysis: Secondary | ICD-10-CM | POA: Diagnosis not present

## 2022-04-26 NOTE — Therapy (Deleted)
OUTPATIENT SPEECH LANGUAGE PATHOLOGY SWALLOW EVALUATION   Patient Name: Hannah [name] MRN: [id number] DOB:16-Apr-1966, 56 y.o., female Today's Date: 04/26/2022  PCP: Vernie Shanks, MD REFERRING PROVIDER: Roddie Mc, NP      Past Medical History:  Diagnosis Date   Cerebral infarction ([place]) 07/12/2017   2009 first one, has had a total of 4 stokes   Chronic kidney disease    tues thurs sat - dialysis   Diabetes type 2, controlled ([place])    Hemiparesis affecting right side as late effect of cerebrovascular accident (CVA) ([place])    Hyperlipidemia    Hypertension    Multiple sclerosis ([place]) 2009   Stroke [facility])    Walker as ambulation aid    Past Surgical History:  Procedure Laterality Date   A/V FISTULAGRAM Right 02/12/2021   Procedure: A/V FISTULAGRAM;  Surgeon: Marty Heck, MD;  Location: [place] [facility];  Service: Cardiovascular;  Laterality: Right;   APPENDECTOMY     AV FISTULA PLACEMENT Right 10/02/2020   Procedure: RIGHT ARTERIOVENOUS (AV) FISTULA CREATION;  Surgeon: Waynetta Sandy, MD;  Location: [place];  Service: Vascular;  Laterality: Right;   [place] Right 11/24/2020   Procedure: SECOND STAGE RIGHT ARM [place];  Surgeon: Marty Heck, MD;  Location: [place];  Service: Vascular;  Laterality: Right;   CYSTOSTOMY W/ BLADDER BIOPSY     IR FLUORO GUIDE CV LINE RIGHT  09/29/2020   IR US GUIDE VASC ACCESS RIGHT  09/29/2020   OVARIAN CYST SURGERY     PERIPHERAL VASCULAR BALLOON ANGIOPLASTY Right 02/12/2021   Procedure: PERIPHERAL VASCULAR BALLOON ANGIOPLASTY;  Surgeon: Marty Heck, MD;  Location: [place] [facility];  Service: Cardiovascular;  Laterality: Right;  UPPER ARM FISTULA   PERIPHERAL VASCULAR BALLOON ANGIOPLASTY Right 01/28/2022   Procedure: PERIPHERAL VASCULAR BALLOON ANGIOPLASTY;  Surgeon: Marty Heck, MD;  Location: [place] [facility];  Service: Cardiovascular;  Laterality: Right;   Arm fistula   stent placed in heart     Patient Active Problem List   Diagnosis Date Noted   Long term (current) use of insulin ([place]) 01/14/2022   Chronic constipation 06/09/2021   Altered mental status, unspecified 01/12/2021   Other chronic postprocedural pain 01/09/2021   Past heart attack 11/13/2020   Spastic hemiplegia due to cerebrovascular disease ([place]) 11/03/2020   Debility 10/18/2020   ESRD on hemodialysis (HCC)    SOB (shortness of breath) 09/27/2020   Volume overload 09/26/2020   CKD (chronic kidney disease) stage 5, GFR less than 15 ml/min ([place]) 09/13/2020   H/O: CVA (cerebrovascular accident) 09/12/2020   Acute blood loss anemia    Chronic kidney disease (CKD), stage IV (severe) ([place])    Uncontrolled type 2 diabetes mellitus with hyperglycemia ([place])    Essential hypertension    Right pontine cerebrovascular accident ([place]) 06/12/2020   Stroke-like symptoms 06/09/2020   Acute kidney injury superimposed on CKD ([place]) 06/09/2020   Cerebral thrombosis with cerebral infarction 06/09/2020   Hypertension    Hyperlipidemia    Diabetes type 2, controlled ([place])    Bimalleolar ankle fracture 11/24/2018   Lower abdominal pain 07/28/2017   Bladder mass 07/18/2017   Painless hematuria 07/18/2017   Intractable vomiting with nausea 07/12/2017   Hemiparesis affecting right side as late effect of cerebrovascular accident (CVA) ([place]) 09/22/2016   AKI (acute kidney injury) ([place]) 01/08/2016   CAD (coronary artery disease) 01/07/2016   Multiple sclerosis ([place]) 2009    ONSET DATE: referral  03-16-22, per pt ongoing for "last few months"  REFERRING DIAG: R13.13 (ICD-10-CM) - Dysphagia, pharyngeal phase   THERAPY DIAG:  No diagnosis found.  Rationale for Evaluation and Treatment: Rehabilitation  SUBJECTIVE:   SUBJECTIVE STATEMENT: ***  PERTINENT HISTORY: PMHx significant for MS, CVA 2019, pt reports acid reflux attempting to be managed by meds (pt reports ineffective),   PAIN:   Are you having pain? No  PATIENT GOALS: improve swallow  OBJECTIVE:   TODAY'S TREATMENT:  04-27-22: ***  03-18-22: Education on evaluation observations and recommendation for instrumental swallow study to assess pt's swallow function and safety based on pt and caregiver's report. Note that no overt s/sx of aspiration were present this date with liquids or solids. SLP educated on swallow strategies and modifications which are recommended in the interim to aid in safe and effective swallow: small bites/sips, use liquid wash, 1 pill at a time, BID oral care, manage reflux symptoms and remain upright during and for 30 minutes following eating. Pt and daughter verbalize understanding of all education via teach back with rare min-A for completeness. Collaborated with pt to generate POC and goals. All questions answered to satisfaction at conclusion of session.   PATIENT EDUCATION: Education details: aspiration and reflux precautions, meal time modifications, MBSS recommendation Person educated: Patient and Building control surveyor (daughter) Education method: Explanation, Demonstration, and Handouts Education comprehension: verbalized understanding, returned demonstration, and needs further education   ASSESSMENT:  CLINICAL IMPRESSION: Ms. Naya Ilagan was seek today at request of FNP Huey Bienenstock for dysphagia evaluation d/t pt reported difficulties with swallowing. Pt reporting usual coughing with thin liquids and solids at home. Has implemented small bites/sips and avoids distractions when eating with some success. Has not altered diet, denies odynophagia or recent pneumonia. Last doctors visit note states lungs are CTAB. During clinical swallow evaluation, pt exhibited no overt s/s of aspiration with solids or liquids.  She had slightly prolonged mastication of solids due to missing dentition. Speech notable for impercise articulation impacting intelligibility. Pt able to teach back dysarthria strategies but  requests additional A from SLP for implementation of strategies to aid in effective communication. Pt reporting voice change which has coincided with swallowing difficulties. Decreased breath support observed during sustained phonation. Voice noted to be hoarse and + vocal tremor, which remained consistent following PO administration.Pt with prior strokes which, per pt and daughter, coincide with voice and speech abnormalities, though further change in voice endorsed.   OBJECTIVE IMPAIRMENTS: include dysarthria, voice disorder, and dysphagia. These impairments are limiting patient from effectively communicating at home and in community and safety when swallowing. Factors affecting potential to achieve goals and functional outcome are co-morbidities, medical prognosis, and previous level of function. Patient will benefit from skilled SLP services to address above impairments and improve overall function.  REHAB POTENTIAL: Fair     GOALS: Goals reviewed with patient? Yes  SHORT TERM GOALS: Target date: 04-15-2022  Pt will complete instrumental swallow evaluation  Baseline: Goal status: MET  2.  Pt will demonstrate dysarthria strategies in oral reading at paragraph level with rare min-A with 80% accuracy Baseline:  Goal status: INITIAL  3.  Pt will accurately demonstrate all dysphagia exercises with min-A, if indicated by MBSS Baseline:  Goal status: REVISED, no deficits indicated per instrumental swallow  4.  Pt and caregiver will teach back swallow compensations and mealtime modifications to optimize swallow function and safety with mod-I Baseline:  Goal status: INITIAL  LONG TERM GOALS: Target date: 04-29-2022  1.  Pt  and caregiver will report carryover of swallow compensations and mealtime modifications with mod-I, resulting in improved PO intake at home Baseline:  Goal status: INITIAL  2.  Pt will report adherence with prescribed HEP (frequency and duration) over 1 week period  with mod-I Baseline:  Goal status: INITIAL  3. Pt will report subjective improvement in swallow function via patient reported outcome measure by d/c Baseline:  Goal status: INITIAL   PLAN:  SLP FREQUENCY: 1x/week  SLP DURATION: 6 weeks  PLANNED INTERVENTIONS: Aspiration precaution training, Pharyngeal strengthening exercises, Diet toleration management , Cueing hierachy, Internal/external aids, Oral motor exercises, Functional tasks, SLP instruction and feedback, Compensatory strategies, Patient/family education, and Re-evaluation    Su Monks, CCC-SLP 04/26/2022, 10:47 AM

## 2022-04-27 ENCOUNTER — Ambulatory Visit: Payer: Medicare Other | Admitting: Physical Therapy

## 2022-04-27 ENCOUNTER — Ambulatory Visit: Payer: Medicare Other | Admitting: Speech Pathology

## 2022-04-27 ENCOUNTER — Encounter: Payer: Self-pay | Admitting: Physical Therapy

## 2022-04-27 NOTE — Therapy (Signed)
[facility] [address] [place], [place], [address] Phone: [phone]   Fax:  [phone]  Patient Details  Name: Hannah [name] MRN: [id number] Date of Birth: [birth year] Referring Provider:  No ref. provider found  Encounter Date: 04/27/2022  Pt did not show to her scheduled PT appointment at 10:15. Called pt and unable to leave VM informing her of next scheduled appointment time.   Cruzita Lederer Kayliee Atienza, PT, DPT 04/27/2022, 10:35 AM  [name] [address] [place], [place], [address] Phone: [phone]   Fax:  [phone]

## 2022-04-28 ENCOUNTER — Ambulatory Visit (HOSPITAL_COMMUNITY): Payer: Medicare Other

## 2022-04-28 DIAGNOSIS — Z992 Dependence on renal dialysis: Secondary | ICD-10-CM | POA: Diagnosis not present

## 2022-04-28 DIAGNOSIS — D689 Coagulation defect, unspecified: Secondary | ICD-10-CM | POA: Diagnosis not present

## 2022-04-28 DIAGNOSIS — N186 End stage renal disease: Secondary | ICD-10-CM | POA: Diagnosis not present

## 2022-04-28 DIAGNOSIS — D509 Iron deficiency anemia, unspecified: Secondary | ICD-10-CM | POA: Diagnosis not present

## 2022-04-28 DIAGNOSIS — N2581 Secondary hyperparathyroidism of renal origin: Secondary | ICD-10-CM | POA: Diagnosis not present

## 2022-04-28 DIAGNOSIS — R519 Headache, unspecified: Secondary | ICD-10-CM | POA: Diagnosis not present

## 2022-04-28 DIAGNOSIS — D631 Anemia in chronic kidney disease: Secondary | ICD-10-CM | POA: Diagnosis not present

## 2022-04-28 DIAGNOSIS — E1122 Type 2 diabetes mellitus with diabetic chronic kidney disease: Secondary | ICD-10-CM | POA: Diagnosis not present

## 2022-04-29 ENCOUNTER — Ambulatory Visit: Payer: Medicare Other

## 2022-04-29 ENCOUNTER — Ambulatory Visit: Payer: Medicare Other | Admitting: Physical Therapy

## 2022-05-03 DIAGNOSIS — Z992 Dependence on renal dialysis: Secondary | ICD-10-CM | POA: Diagnosis not present

## 2022-05-03 DIAGNOSIS — D631 Anemia in chronic kidney disease: Secondary | ICD-10-CM | POA: Diagnosis not present

## 2022-05-03 DIAGNOSIS — D509 Iron deficiency anemia, unspecified: Secondary | ICD-10-CM | POA: Diagnosis not present

## 2022-05-03 DIAGNOSIS — R519 Headache, unspecified: Secondary | ICD-10-CM | POA: Diagnosis not present

## 2022-05-03 DIAGNOSIS — N2581 Secondary hyperparathyroidism of renal origin: Secondary | ICD-10-CM | POA: Diagnosis not present

## 2022-05-03 DIAGNOSIS — E1122 Type 2 diabetes mellitus with diabetic chronic kidney disease: Secondary | ICD-10-CM | POA: Diagnosis not present

## 2022-05-03 DIAGNOSIS — N186 End stage renal disease: Secondary | ICD-10-CM | POA: Diagnosis not present

## 2022-05-03 DIAGNOSIS — D689 Coagulation defect, unspecified: Secondary | ICD-10-CM | POA: Diagnosis not present

## 2022-05-04 ENCOUNTER — Encounter: Payer: Self-pay | Admitting: Occupational Therapy

## 2022-05-04 ENCOUNTER — Ambulatory Visit: Payer: Medicare Other | Admitting: Speech Pathology

## 2022-05-04 NOTE — Therapy (Unsigned)
[facility] [address] [facility] [address] [place], [place], [address] Phone: [phone]   Fax:  [phone]  Patient Details  Name: Hannah [name] MRN: [id number] Date of Birth: [birth year] Referring Provider:  No ref. provider found  Encounter Date: 05/04/2022  PHYSICAL THERAPY DISCHARGE SUMMARY  Visits from Start of Care: 1  Current functional level related to goals / functional outcomes: See eval   Remaining deficits: See eval   Education / Equipment: PT POC   Patient agrees to discharge. Patient goals were  unable to be assessed as patient did not return since eval . Patient is being discharged due to not returning since the last visit.  Debbora Dus, PT Debbora Dus, PT, DPT, CBIS  05/04/2022, 11:59 AM  [facility] [address] [place], [place], [address] Phone: [phone]   Fax:  [phone]

## 2022-05-04 NOTE — Therapy (Deleted)
OUTPATIENT SPEECH LANGUAGE PATHOLOGY SWALLOW EVALUATION   Patient Name: Hannah [name] MRN: [id number] DOB:[birth year], 56 y.o., female Today's Date: 05/04/2022  PCP: Vernie Shanks, MD REFERRING PROVIDER: Roddie Mc, NP      Past Medical History:  Diagnosis Date   Cerebral infarction ([place]) 07/12/2017   2009 first one, has had a total of 4 stokes   Chronic kidney disease    tues thurs sat - dialysis   Diabetes type 2, controlled ([place])    Hemiparesis affecting right side as late effect of cerebrovascular accident (CVA) ([place])    Hyperlipidemia    Hypertension    Multiple sclerosis ([place]) 2009   Stroke [facility])    Walker as ambulation aid    Past Surgical History:  Procedure Laterality Date   A/V FISTULAGRAM Right 02/12/2021   Procedure: A/V FISTULAGRAM;  Surgeon: Marty Heck, MD;  Location: [place] [facility];  Service: Cardiovascular;  Laterality: Right;   APPENDECTOMY     AV FISTULA PLACEMENT Right 10/02/2020   Procedure: RIGHT ARTERIOVENOUS (AV) FISTULA CREATION;  Surgeon: Waynetta Sandy, MD;  Location: [place];  Service: Vascular;  Laterality: Right;   [place] Right 11/24/2020   Procedure: SECOND STAGE RIGHT ARM [place];  Surgeon: Marty Heck, MD;  Location: [place];  Service: Vascular;  Laterality: Right;   CYSTOSTOMY W/ BLADDER BIOPSY     IR FLUORO GUIDE CV LINE RIGHT  09/29/2020   IR US GUIDE VASC ACCESS RIGHT  09/29/2020   OVARIAN CYST SURGERY     PERIPHERAL VASCULAR BALLOON ANGIOPLASTY Right 02/12/2021   Procedure: PERIPHERAL VASCULAR BALLOON ANGIOPLASTY;  Surgeon: Marty Heck, MD;  Location: [place] [facility];  Service: Cardiovascular;  Laterality: Right;  UPPER ARM FISTULA   PERIPHERAL VASCULAR BALLOON ANGIOPLASTY Right 01/28/2022   Procedure: PERIPHERAL VASCULAR BALLOON ANGIOPLASTY;  Surgeon: Marty Heck, MD;  Location: [place] [facility];  Service: Cardiovascular;  Laterality: Right;   Arm fistula   stent placed in heart     Patient Active Problem List   Diagnosis Date Noted   Long term (current) use of insulin ([place]) 01/14/2022   Chronic constipation 06/09/2021   Altered mental status, unspecified 01/12/2021   Other chronic postprocedural pain 01/09/2021   Past heart attack 11/13/2020   Spastic hemiplegia due to cerebrovascular disease ([place]) 11/03/2020   Debility 10/18/2020   ESRD on hemodialysis (HCC)    SOB (shortness of breath) 09/27/2020   Volume overload 09/26/2020   CKD (chronic kidney disease) stage 5, GFR less than 15 ml/min ([place]) 09/13/2020   H/O: CVA (cerebrovascular accident) 09/12/2020   Acute blood loss anemia    Chronic kidney disease (CKD), stage IV (severe) ([place])    Uncontrolled type 2 diabetes mellitus with hyperglycemia ([place])    Essential hypertension    Right pontine cerebrovascular accident ([place]) 06/12/2020   Stroke-like symptoms 06/09/2020   Acute kidney injury superimposed on CKD ([place]) 06/09/2020   Cerebral thrombosis with cerebral infarction 06/09/2020   Hypertension    Hyperlipidemia    Diabetes type 2, controlled ([place])    Bimalleolar ankle fracture 11/24/2018   Lower abdominal pain 07/28/2017   Bladder mass 07/18/2017   Painless hematuria 07/18/2017   Intractable vomiting with nausea 07/12/2017   Hemiparesis affecting right side as late effect of cerebrovascular accident (CVA) ([place]) 09/22/2016   AKI (acute kidney injury) ([place]) 01/08/2016   CAD (coronary artery disease) 01/07/2016   Multiple sclerosis ([place]) 2009    ONSET DATE: referral  03-16-22, per pt ongoing for "last few months"  REFERRING DIAG: R13.13 (ICD-10-CM) - Dysphagia, pharyngeal phase   THERAPY DIAG:  No diagnosis found.  Rationale for Evaluation and Treatment: Rehabilitation  SUBJECTIVE:   SUBJECTIVE STATEMENT: ***  PERTINENT HISTORY: PMHx significant for MS, CVA 2019, pt reports acid reflux attempting to be managed by meds (pt reports ineffective),   PAIN:   Are you having pain? No  PATIENT GOALS: improve swallow  OBJECTIVE:   TODAY'S TREATMENT:  05-04-22: ***  03-18-22: Education on evaluation observations and recommendation for instrumental swallow study to assess pt's swallow function and safety based on pt and caregiver's report. Note that no overt s/sx of aspiration were present this date with liquids or solids. SLP educated on swallow strategies and modifications which are recommended in the interim to aid in safe and effective swallow: small bites/sips, use liquid wash, 1 pill at a time, BID oral care, manage reflux symptoms and remain upright during and for 30 minutes following eating. Pt and daughter verbalize understanding of all education via teach back with rare min-A for completeness. Collaborated with pt to generate POC and goals. All questions answered to satisfaction at conclusion of session.   PATIENT EDUCATION: Education details: aspiration and reflux precautions, meal time modifications, MBSS recommendation Person educated: Patient and Building control surveyor (daughter) Education method: Explanation, Demonstration, and Handouts Education comprehension: verbalized understanding, returned demonstration, and needs further education   ASSESSMENT:  CLINICAL IMPRESSION: [name] was seek today at request of FNP Huey Bienenstock for dysphagia evaluation d/t pt reported difficulties with swallowing. Pt reporting usual coughing with thin liquids and solids at home. Has implemented small bites/sips and avoids distractions when eating with some success. Has not altered diet, denies odynophagia or recent pneumonia. Last doctors visit note states lungs are CTAB. During clinical swallow evaluation, pt exhibited no overt s/s of aspiration with solids or liquids.  She had slightly prolonged mastication of solids due to missing dentition. Speech notable for impercise articulation impacting intelligibility. Pt able to teach back dysarthria strategies but  requests additional A from SLP for implementation of strategies to aid in effective communication. Pt reporting voice change which has coincided with swallowing difficulties. Decreased breath support observed during sustained phonation. Voice noted to be hoarse and + vocal tremor, which remained consistent following PO administration.Pt with prior strokes which, per pt and daughter, coincide with voice and speech abnormalities, though further change in voice endorsed.   OBJECTIVE IMPAIRMENTS: include dysarthria, voice disorder, and dysphagia. These impairments are limiting patient from effectively communicating at home and in community and safety when swallowing. Factors affecting potential to achieve goals and functional outcome are co-morbidities, medical prognosis, and previous level of function. Patient will benefit from skilled SLP services to address above impairments and improve overall function.  REHAB POTENTIAL: Fair     GOALS: Goals reviewed with patient? Yes  SHORT TERM GOALS: Target date: 04-15-2022  Pt will complete instrumental swallow evaluation  Baseline: Goal status: MET  2.  Pt will demonstrate dysarthria strategies in oral reading at paragraph level with rare min-A with 80% accuracy Baseline:  Goal status: INITIAL  3.  Pt will accurately demonstrate all dysphagia exercises with min-A, if indicated by MBSS Baseline:  Goal status: REVISED, no deficits indicated per instrumental swallow  4.  Pt and caregiver will teach back swallow compensations and mealtime modifications to optimize swallow function and safety with mod-I Baseline:  Goal status: INITIAL  LONG TERM GOALS: Target date: 04-29-2022  1.  Pt  and caregiver will report carryover of swallow compensations and mealtime modifications with mod-I, resulting in improved PO intake at home Baseline:  Goal status: INITIAL  2.  Pt will report adherence with prescribed HEP (frequency and duration) over 1 week period  with mod-I Baseline:  Goal status: INITIAL  3. Pt will report subjective improvement in swallow function via patient reported outcome measure by d/c Baseline:  Goal status: INITIAL   PLAN:  SLP FREQUENCY: 1x/week  SLP DURATION: 6 weeks  PLANNED INTERVENTIONS: Aspiration precaution training, Pharyngeal strengthening exercises, Diet toleration management , Cueing hierachy, Internal/external aids, Oral motor exercises, Functional tasks, SLP instruction and feedback, Compensatory strategies, Patient/family education, and Re-evaluation    Su Monks, CCC-SLP 05/04/2022, 10:26 AM

## 2022-05-04 NOTE — Therapy (Signed)
[facility] [address] [place], [place], [address] Phone: [phone]   Fax:  [phone]  Patient Details  Name: Hannah [name] MRN: [id number] Date of Birth: 07-04-1965 Referring Provider:  No ref. provider found  Encounter Date: 05/04/2022 OCCUPATIONAL THERAPY DISCHARGE SUMMARY  Visits from Start of Care: 1  Current functional level related to goals / functional outcomes: See intial eval, pt did not return.   Remaining deficits: See initial eval   Education / Equipment: N/a eval only   Patient agrees to discharge. Patient goals were not met. Patient is being discharged due to  all pt. Visits were cancelled due to lack of transportation...     Danae Oland, OT 05/04/2022, 1:15 PM  [facility] [address] [place], [place], [address] Phone: [phone]   Fax:  [phone]

## 2022-05-05 ENCOUNTER — Emergency Department (HOSPITAL_COMMUNITY): Payer: Medicare Other

## 2022-05-05 ENCOUNTER — Emergency Department (HOSPITAL_COMMUNITY)
Admission: EM | Admit: 2022-05-05 | Discharge: 2022-05-05 | Disposition: A | Discharge status: Home / Self Care | Payer: Medicare Other | Attending: Emergency Medicine | Admitting: Emergency Medicine

## 2022-05-05 DIAGNOSIS — D509 Iron deficiency anemia, unspecified: Secondary | ICD-10-CM | POA: Diagnosis not present

## 2022-05-05 DIAGNOSIS — N189 Chronic kidney disease, unspecified: Secondary | ICD-10-CM | POA: Insufficient documentation

## 2022-05-05 DIAGNOSIS — E119 Type 2 diabetes mellitus without complications: Secondary | ICD-10-CM | POA: Insufficient documentation

## 2022-05-05 DIAGNOSIS — N2581 Secondary hyperparathyroidism of renal origin: Secondary | ICD-10-CM | POA: Diagnosis not present

## 2022-05-05 DIAGNOSIS — I6621 Occlusion and stenosis of right posterior cerebral artery: Secondary | ICD-10-CM | POA: Diagnosis not present

## 2022-05-05 DIAGNOSIS — I129 Hypertensive chronic kidney disease with stage 1 through stage 4 chronic kidney disease, or unspecified chronic kidney disease: Secondary | ICD-10-CM | POA: Diagnosis not present

## 2022-05-05 DIAGNOSIS — Z79899 Other long term (current) drug therapy: Secondary | ICD-10-CM | POA: Insufficient documentation

## 2022-05-05 DIAGNOSIS — I6381 Other cerebral infarction due to occlusion or stenosis of small artery: Secondary | ICD-10-CM | POA: Diagnosis not present

## 2022-05-05 DIAGNOSIS — R079 Chest pain, unspecified: Secondary | ICD-10-CM | POA: Insufficient documentation

## 2022-05-05 DIAGNOSIS — E1122 Type 2 diabetes mellitus with diabetic chronic kidney disease: Secondary | ICD-10-CM | POA: Diagnosis not present

## 2022-05-05 DIAGNOSIS — R519 Headache, unspecified: Secondary | ICD-10-CM | POA: Diagnosis not present

## 2022-05-05 DIAGNOSIS — N186 End stage renal disease: Secondary | ICD-10-CM | POA: Diagnosis not present

## 2022-05-05 DIAGNOSIS — R29898 Other symptoms and signs involving the musculoskeletal system: Secondary | ICD-10-CM | POA: Diagnosis not present

## 2022-05-05 DIAGNOSIS — Z794 Long term (current) use of insulin: Secondary | ICD-10-CM | POA: Insufficient documentation

## 2022-05-05 DIAGNOSIS — G319 Degenerative disease of nervous system, unspecified: Secondary | ICD-10-CM | POA: Diagnosis not present

## 2022-05-05 DIAGNOSIS — R531 Weakness: Secondary | ICD-10-CM | POA: Insufficient documentation

## 2022-05-05 DIAGNOSIS — R0789 Other chest pain: Secondary | ICD-10-CM | POA: Diagnosis not present

## 2022-05-05 DIAGNOSIS — I2510 Atherosclerotic heart disease of native coronary artery without angina pectoris: Secondary | ICD-10-CM | POA: Insufficient documentation

## 2022-05-05 DIAGNOSIS — D631 Anemia in chronic kidney disease: Secondary | ICD-10-CM | POA: Diagnosis not present

## 2022-05-05 DIAGNOSIS — Z743 Need for continuous supervision: Secondary | ICD-10-CM | POA: Diagnosis not present

## 2022-05-05 DIAGNOSIS — R471 Dysarthria and anarthria: Secondary | ICD-10-CM | POA: Diagnosis not present

## 2022-05-05 DIAGNOSIS — D689 Coagulation defect, unspecified: Secondary | ICD-10-CM | POA: Diagnosis not present

## 2022-05-05 DIAGNOSIS — Z992 Dependence on renal dialysis: Secondary | ICD-10-CM | POA: Diagnosis not present

## 2022-05-05 DIAGNOSIS — R29810 Facial weakness: Secondary | ICD-10-CM | POA: Diagnosis not present

## 2022-05-05 LAB — CBC WITH DIFFERENTIAL/PLATELET
Abs Immature Granulocytes: 0.02 10*3/uL (ref 0.00–0.07)
Basophils Absolute: 0 10*3/uL (ref 0.0–0.1)
Basophils Relative: 0 %
Eosinophils Absolute: 0.2 10*3/uL (ref 0.0–0.5)
Eosinophils Relative: 3 %
HCT: 36.5 % (ref 36.0–46.0)
Hemoglobin: 12 g/dL (ref 12.0–15.0)
Immature Granulocytes: 0 %
Lymphocytes Relative: 51 %
Lymphs Abs: 4.3 10*3/uL — ABNORMAL HIGH (ref 0.7–4.0)
MCH: 27.1 pg (ref 26.0–34.0)
MCHC: 32.9 g/dL (ref 30.0–36.0)
MCV: 82.4 fL (ref 80.0–100.0)
Monocytes Absolute: 0.8 10*3/uL (ref 0.1–1.0)
Monocytes Relative: 10 %
Neutro Abs: 3 10*3/uL (ref 1.7–7.7)
Neutrophils Relative %: 36 %
Platelets: 452 10*3/uL — ABNORMAL HIGH (ref 150–400)
RBC: 4.43 MIL/uL (ref 3.87–5.11)
RDW: 13.6 % (ref 11.5–15.5)
WBC: 8.4 10*3/uL (ref 4.0–10.5)
nRBC: 0 % (ref 0.0–0.2)

## 2022-05-05 LAB — I-STAT CHEM 8, ED
BUN: 9 mg/dL (ref 6–20)
Calcium, Ion: 1.03 mmol/L — ABNORMAL LOW (ref 1.15–1.40)
Chloride: 95 mmol/L — ABNORMAL LOW (ref 98–111)
Creatinine, Ser: 2.6 mg/dL — ABNORMAL HIGH (ref 0.44–1.00)
Glucose, Bld: 155 mg/dL — ABNORMAL HIGH (ref 70–99)
HCT: 32 % — ABNORMAL LOW (ref 36.0–46.0)
Hemoglobin: 10.9 g/dL — ABNORMAL LOW (ref 12.0–15.0)
Potassium: 3.7 mmol/L (ref 3.5–5.1)
Sodium: 137 mmol/L (ref 135–145)
TCO2: 30 mmol/L (ref 22–32)

## 2022-05-05 LAB — APTT: aPTT: 29 seconds (ref 24–36)

## 2022-05-05 LAB — COMPREHENSIVE METABOLIC PANEL
ALT: 11 U/L (ref 0–44)
AST: 15 U/L (ref 15–41)
Albumin: 3.7 g/dL (ref 3.5–5.0)
Alkaline Phosphatase: 63 U/L (ref 38–126)
Anion gap: 10 (ref 5–15)
BUN: 8 mg/dL (ref 6–20)
CO2: 29 mmol/L (ref 22–32)
Calcium: 8.5 mg/dL — ABNORMAL LOW (ref 8.9–10.3)
Chloride: 99 mmol/L (ref 98–111)
Creatinine, Ser: 2.38 mg/dL — ABNORMAL HIGH (ref 0.44–1.00)
GFR, Estimated: 23 mL/min — ABNORMAL LOW (ref >60)
Glucose, Bld: 149 mg/dL — ABNORMAL HIGH (ref 70–99)
Potassium: 3.5 mmol/L (ref 3.5–5.1)
Sodium: 138 mmol/L (ref 135–145)
Total Bilirubin: 0.4 mg/dL (ref 0.3–1.2)
Total Protein: 7.3 g/dL (ref 6.5–8.1)

## 2022-05-05 LAB — PROTIME-INR
INR: 1 (ref 0.8–1.2)
Prothrombin Time: 13.5 seconds (ref 11.4–15.2)

## 2022-05-05 LAB — TROPONIN I (HIGH SENSITIVITY)
Troponin I (High Sensitivity): 9 ng/L (ref <18)
Troponin I (High Sensitivity): 10 ng/L (ref <18)

## 2022-05-05 LAB — I-STAT BETA HCG BLOOD, ED (MC, WL, AP ONLY): I-stat hCG, quantitative: <5 m[IU]/mL (ref <5)

## 2022-05-05 LAB — ETHANOL: Alcohol, Ethyl (B): <10 mg/dL (ref <10)

## 2022-05-05 LAB — CBG MONITORING, ED: Glucose-Capillary: 97 mg/dL (ref 70–99)

## 2022-05-05 MED ORDER — SODIUM CHLORIDE 0.9% FLUSH: 3.0000 mL | Freq: Once | INTRAVENOUS | Status: DC

## 2022-05-05 MED ORDER — IOHEXOL 350 MG/ML SOLN
75.0000 mL | Freq: Once | INTRAVENOUS | Status: AC | PRN
  Administered 2022-05-05: 75 mL via INTRAVENOUS

## 2022-05-05 NOTE — Consult Note (Signed)
NEUROLOGY CONSULTATION NOTE   Date of service: May 05, 2022 Patient Name: Hannah [name] MRN:  [id number] DOB:  Feb 16, 1966 Reason for consult: "Concern for worsening baseline R sided weakness" Requesting Provider: Dorie Rank, MD _ _ _   _ __   _ __ _ _  __ __   _ __   __ _  History of Present Illness  Oluwateniola Leitch is a 56 y.o. 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  Patient on my evaluation is slow to respond and hard to get much history out of her.  I spoke to patient's daughter [name] over phone in person.  She reports the patient went to dialysis at 715 this morning and was at her absolute baseline.  It is not unusual for her to be little tired after dialysis and exhausted.  She picked up patient from dialysis and noted that she was weak and tired.  On the way home, patient reported some chest pain.  Daughter felt the patient was way worse than her typical exhaustion/tiredness after hemodialysis and called EMS.  I am not sure if EMS were told if she has baseline right-sided weakness but EMS brought her in as a code stroke for right-sided weakness and right facial droop.  It was unclear on arrival if her deficit is worse than her baseline.  She underwent CT head without contrast and CT angio head and neck without contrast which was negative for a large territory stroke, no ICH, no large vessel occlusion.  We took her to stat MRI brain without contrast which was negative for any acute stroke.  It demonstrates advanced and chronic white matter disease versus chronic demyelinating disease.  LKW: 05/05/22 at 0715 mRS: 3 tNKASE: Not offered due to  no stroke on MRI and patient is outside the window. Thrombectomy: Not offered due to no large vessel occlusion NIHSS components Score: Comment  1a Level of Conscious 0[]  1[x]  2[]  3[]      1b LOC Questions 0[x]  1[]  2[]       1c LOC Commands 0[x]  1[]  2[]       2 Best Gaze 0[x]  1[]  2[]       3 Visual 0[]  1[x]  2[]  3[]      4 Facial Palsy 0[]  1[]  2[x]  3[]      5a Motor Arm - left 0[x]  1[]  2[]  3[]  4[]  UN[]    5b Motor Arm - Right 0[]  1[x]  2[]  3[]  4[]  UN[]    6a Motor Leg - Left 0[x]  1[]  2[]  3[]  4[]  UN[]    6b Motor Leg - Right 0[]  1[x]  2[]  3[]  4[]  UN[]    7 Limb Ataxia 0[]  1[]  2[x]  3[]  UN[]     8 Sensory 0[x]  1[]  2[]  UN[]      9 Best Language 0[]  1[x]  2[]  3[]      10 Dysarthria 0[]  1[x]  2[]  UN[]      11 Extinct. and Inattention 0[x]  1[]  2[]       TOTAL: 10       ROS   Difficult to get a detailed and full review of systems secondary to encephalopathy and mild aphasia.  Past History   Past Medical History:  Diagnosis Date   Cerebral infarction ([place]) 07/12/2017   2009 first one, has had a total of  4 stokes   Chronic kidney disease    tues thurs sat - dialysis   Diabetes type 2, controlled ([place])    Hemiparesis affecting right side as late effect of cerebrovascular accident (CVA) ([place])    Hyperlipidemia    Hypertension    Multiple sclerosis ([place]) 2009   [facility]roke [facility])    Walker as ambulation aid    Past Surgical History:  Procedure Laterality Date   A/V FISTULAGRAM Right 02/12/2021   Procedure: A/V FISTULAGRAM;  Surgeon: Marty Heck, MD;  Location: [place] [facility];  Service: Cardiovascular;  Laterality: Right;   APPENDECTOMY     AV FISTULA PLACEMENT Right 10/02/2020   Procedure: RIGHT ARTERIOVENOUS (AV) FISTULA CREATION;  Surgeon: Waynetta Sandy, MD;  Location: [place];  Service: Vascular;  Laterality: Right;   [place] Right 11/24/2020   Procedure: SECOND STAGE RIGHT ARM [place];  Surgeon: Marty Heck, MD;  Location: [place];   Service: Vascular;  Laterality: Right;   CYSTOSTOMY W/ BLADDER BIOPSY     IR FLUORO GUIDE CV LINE RIGHT  09/29/2020   IR US GUIDE VASC ACCESS RIGHT  09/29/2020   OVARIAN CYST SURGERY     PERIPHERAL VASCULAR BALLOON ANGIOPLASTY Right 02/12/2021   Procedure: PERIPHERAL VASCULAR BALLOON ANGIOPLASTY;  Surgeon: Marty Heck, MD;  Location: [place] [facility];  Service: Cardiovascular;  Laterality: Right;  UPPER ARM FISTULA   PERIPHERAL VASCULAR BALLOON ANGIOPLASTY Right 01/28/2022   Procedure: PERIPHERAL VASCULAR BALLOON ANGIOPLASTY;  Surgeon: Marty Heck, MD;  Location: [place] [facility];  Service: Cardiovascular;  Laterality: Right;  Arm fistula   stent placed in heart     No family history on file. Social History   Socioeconomic History   Marital status: Divorced    Spouse name: Not on file   Number of children: Not on file   Years of education: Not on file   Highest education level: Not on file  Occupational History   Occupation: disabled  Tobacco Use   Smoking status: Never   Smokeless tobacco: Never  Vaping Use   Vaping Use: Never used  Substance and Sexual Activity   Alcohol use: Not Currently   Drug use: Never   Sexual activity: Not on file  Other Topics Concern   Not on file  Social History Narrative   Not on file   Social Determinants of Health   Financial Resource Strain: Not on file  Food Insecurity: No Food Insecurity (11/10/2020)   Hunger Vital Sign    Worried About Running Out of Food in the Last Year: Never true    [place] in the Last Year: Never true  Transportation Needs: No Transportation Needs (11/10/2020)   PRAPARE - Hydrologist (Medical): No    Lack of Transportation (Non-Medical): No  Physical Activity: Not on file  Stress: Not on file  Social Connections: Not on file   Allergies  Allergen Reactions   Ibuprofen Swelling    Swelling to hands, feet and face    Influenza Vaccines Anaphylaxis and  Swelling    fever   Pneumococcal Vaccine Anaphylaxis and Swelling    fever   Promethazine Other (See Comments)    hallucinations   Ambien [Zolpidem] Other (See Comments)    Unknown reaction   Codeine Itching   Cyclobenzaprine Other (See Comments)    hallucinations    Hydrocodone-Acetaminophen Itching   Tape Itching   Tramadol Hcl Other (  See Comments)    hallucinations   Wound Dressing Adhesive Itching    Use paper tape   Oxycodone Nausea And Vomiting    Medications  (Not in a hospital admission)    Vitals   Vitals:   05/05/22 1345 05/05/22 1400 05/05/22 1415 05/05/22 1430  BP: (!) 186/69 (!) 146/77 (!) 150/70 (!) 148/70  Pulse: 87 80 79 83  Resp: 12 10 20 15   Temp:      SpO2: 100% 100% 100% 100%  Weight:         Body mass index is 20.65 kg/m.  Physical Exam   General: Laying comfortably in bed; in no acute distress. HENT: Normal oropharynx and mucosa. Normal external appearance of ears and nose.  Neck: Supple, no pain or tenderness  CV: No JVD. No peripheral edema.  Pulmonary: Symmetric Chest rise. Normal respiratory effort.  Abdomen: Soft to touch, non-tender.  Ext: No cyanosis, edema, or deformity  Skin: No rash. Normal palpation of skin.   Musculoskeletal: Normal digits and nails by inspection. No clubbing.   Neurologic Examination  Mental status/Cognition: somewhat somnolent, poor attention. Opens eye to voice. Bradyphrenic. oriented to self, place, age, month. Speech/language: Non fluent, comprehension intact to some but not all commands.name some but not all objects. repetition intact. Limited  Cranial nerves:   CN II Pupils equal and reactive to light, ? Vision deficit, inconsistent reponses on testing visual fields BL   CN III,IV,VI EOM intact, no gaze preference or deviation, no nystagmus   CN V normal sensation in V1, V2, and V3 segments bilaterally   CN VII R facial droop   CN VIII normal hearing to speech    CN IX & X normal palatal elevation,  no uvular deviation    CN XI 5/5 head turn and 5/5 shoulder shrug bilaterally    CN XII midline tongue protrusion    Motor:  Muscle bulk: normal, tone increased with contrasctures in RUE. Mvmt Root Nerve  Muscle Right Left Comments  SA C5/6 Ax Deltoid     EF C5/6 Mc Biceps 3 5   EE C6/7/8 Rad Triceps 3 5   WF C6/7 Med FCR     WE C7/8 PIN ECU     F Ab C8/T1 U ADM/FDI 4 5   HF L1/2/3 Fem Illopsoas 3 5   KE L2/3/4 Fem Quad     DF L4/5 D Peron Tib Ant 4 5   PF S1/2 Tibial Grc/Sol 4 5    Sensation:  Light touch Intact BL   Pin prick    Temperature    Vibration   Proprioception    Coordination/Complex Motor:  - Finger to Nose intact on the left, ataxia in RUE - Heel to shin ataxia in RUE - Rapid alternating movement are slowed throughout. - Gait: deferred for patient safety.  Labs   CBC: No results for input(s): "WBC", "NEUTROABS", "HGB", "HCT", "MCV", "PLT" in the last 168 hours.  Basic Metabolic Panel:  Lab Results  Component Value Date   NA 138 05/05/2022   K 3.5 05/05/2022   CO2 29 05/05/2022   GLUCOSE 149 (H) 05/05/2022   BUN 8 05/05/2022   CREATININE 2.38 (H) 05/05/2022   CALCIUM 8.5 (L) 05/05/2022   GFRNONAA 23 (L) 05/05/2022   Lipid Panel:  Lab Results  Component Value Date   LDLCALC 41 04/01/2022   HgbA1c:  Lab Results  Component Value Date   HGBA1C 7.5 (H) 04/01/2022   Urine Drug Screen:  Component Value Date/Time   LABOPIA NONE DETECTED 01/12/2021 2100   COCAINSCRNUR NONE DETECTED 01/12/2021 2100   LABBENZ NONE DETECTED 01/12/2021 2100   AMPHETMU NONE DETECTED 01/12/2021 2100   [place] NONE DETECTED 01/12/2021 2100   LABBARB NONE DETECTED 01/12/2021 2100    Alcohol Level     Component Value Date/Time   ETH <10 05/05/2022 1243    CT Head without contrast(Personally reviewed): CTH was negative for a large hypodensity concerning for a large territory infarct or hyperdensity concerning for an ICH  CT angio Head and Neck with  contrast(Personally reviewed): No LVO  MRI Brain(Personally reviewed): No Stroke, chronic microvascular disease vs progressive demyelinating disease.  Impression   Golda Zavalza is a 56 y.o. 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  It seems like EMS was not aware of her residual R sided weakness and R facial droop and she was activated as a code stroke. Exam seems similar to prior neuro exam in stroke clinic from about a month ago.  Workup with CTH, CTA, MRI brain negative for stroke. Overall, suspect that she probably has encephalopathy, lethargy and possibly recrudescence of prior stroke symptoms in the setting of a metabolic stressor rather than an acute stroke.  Recommendations  - No further inpatient neurological workup. Suspect that she is much close to her baseline neuro deficit and possibly had recudescence in the setting of stressors including chest pain which she endorsed earlier. - neurology inpatient team will signoff. Please feel free to contact us with any questions or concerns. ______________________________________________________________________   Thank you for the opportunity to take part in the care of this patient. If you have any further questions, please contact the neurology consultation attending.  Signed,  [place] Pager Number [phone] _ _ _   _ __   _ __ _ _  __ __   _ __   __ _

## 2022-05-05 NOTE — Discharge Instructions (Signed)
Please follow-up with your primary care office in 2-3 days.   Cardiology will call you about an appointment in the next 3 days to schedule additional testing. Please talk to them to see if you need stress testing.   Return if you experience any of the following: worsening chest pain with unexplained sweating, vomiting, shortness of breath, or any other concerning symptoms.

## 2022-05-05 NOTE — Code Documentation (Signed)
Stroke Response Nurse Documentation Code Documentation  Hannah [name] is a 56 y.o. female arriving to [name]  via [facility] on 05-05-2022 with past medical hx of CVA, MS, HTN, DM, ESRD, . On clopidogrel 75 mg daily. Code stroke was activated by EMS.   Patient from Home where she was LKW at [facility] and now complaining of increased right weakness and slow to speak. When her daughter dropped her off at Hemodialysis she was at her normal baseline.  Per Daughter the patient has had right side weakness since her previous stroke.  After she picked her up from dialysis she noted that she was fatigued and weak.  Upon arrival at home her daughter noted that she had increased weakness of her right side compared to her normal.  Stroke team at the bedside on patient arrival. Labs drawn and patient cleared for CT by Dr. Tomi Bamberger. Patient to CT with team. NIHSS 10, see documentation for details and code stroke times. Patient with decreased LOC, right hemianopia, right facial droop, right arm weakness, right leg weakness, right limb ataxia, Expressive aphasia , and dysarthria  on exam. The following imaging was completed:  CT Head, CTA, and MRI. Patient is not a candidate for IV Thrombolytic due to LKW is 0715. Patient is not a candidate for IR due to no LVO on CTA.   Code Stroke canceled at 1326.   Bedside handoff with ED .parametic Theone Murdoch, [facility]  Stroke Response RN

## 2022-05-05 NOTE — ED Notes (Signed)
Pt received discharge teaching with no difficulty and was able to understand fully. Pt discharged home in wheelchair with family.

## 2022-05-05 NOTE — ED Triage Notes (Signed)
Patient BIB [facility] from home for evaluation of right sided facial droop, right sided weakness and dysarthria, last known normal 1000 today. History of a stroke two years ago with similar symptoms but those symptoms had resolved. 20g saline lock in left forearm.

## 2022-05-05 NOTE — ED Provider Notes (Signed)
  Physical Exam  BP 128/68   Pulse 85   Temp 98.1 F (36.7 C) (Oral)   Resp 10   Wt 51.2 kg   SpO2 100%   BMI 20.65 kg/m   Physical Exam  Procedures  Procedures  ED Course / MDM   Clinical Course as of 05/06/22 1305  Wed May 05, 2022  1536 Chest x-ray without acute findings.  CT head and MRI without signs of acute stroke [JK]  1615 Troponin I (High Sensitivity) Initial troponin normal [JK]  1615 I-stat chem 8, ED(!) I-STAT Chem-8 shows stable hemoglobin [JK]  1615 I-stat chem 8, ED(!) Creatinine stable [JK]  1657 Assumed care from Dr Tomi Bamberger. 56 yo F with hx of CAD sp PCI and stroke with chronic deficits initially presented as a code stroke with negative workup (appears that neuro symptoms are all old). After EDP evaluation described chest pain after her iHD session today. Awaiting 2nd troponin at this time and can likely fu with cards afterwards.  [RP]  1957 Re-evaluated patient. No chest pain currently.  Described the pain that she was having as sharp and substernal and says is unsure if it was exertional. No diaphoresis or vomiting with the CP.  [RP]  2000 History, age, and risk factors give the patient heart score of 4.  Discussed disposition with the patient and her daughter who is at the bedside and offered admission given her moderate risk heart score.  After discussing the risks and benefits they preferred to follow-up outpatient with a cardiologist.  Will also have him follow-up with her primary doctor in 2 to 3 days.  Ambulatory referral to cardiology placed and return precautions discussed with the patient and her daughter prior to discharge. [RP]    Clinical Course User Index [JK] Dorie Rank, MD [RP] Fransico Meadow, MD   Medical Decision Making Amount and/or Complexity of Data Reviewed Labs: ordered. Decision-making details documented in ED Course. Radiology: ordered.      Fransico Meadow, MD 05/06/22 [id number]

## 2022-05-05 NOTE — ED Provider Notes (Signed)
[facility] EMERGENCY DEPARTMENT Provider Note   CSN: [id number] Arrival date & time: 05/05/22  1219  An emergency department physician performed an initial assessment on this suspected stroke patient at 1220.  History {Add pertinent medical, surgical, social history, OB history to HPI:1} Chief Complaint  Patient presents with   Code Stroke    Hannah [name] is a 56 y.o. female.  HPI   Patient has history of prior stroke, diabetes, hyperlipidemia, hypertension, chronic kidney disease, coronary artery disease.  Patient states she went to dialysis today.  After she went to dialysis she started experiencing some pain in her chest.  She took some nitroglycerin that she had at home.  Rhytid facial droop right arm weakness and dysarthria.  They activated a code stroke.  After further discussion with the family it was determined the patient has had the symptoms since her prior stroke 2 years ago.  Code stroke was eventually deactivated.  Patient states she is no longer having any chest pain now.  She cannot tell me exactly the quality of the pain she is experiencing.  Patient and her daughter however said that she has been using more of her nitroglycerin recently  Home Medications Prior to Admission medications   Medication Sig Start Date End Date Taking? Authorizing Provider  acetaminophen (TYLENOL) 325 MG tablet Take 650 mg by mouth every 6 (six) hours as needed for moderate pain.    [provider]  amLODipine (NORVASC) 10 MG tablet Take 1 tablet (10 mg total) by mouth daily. 11/03/20   Love, Ivan Anchors, PA-C  calcium acetate (PHOSLO) 667 MG capsule Take 667 mg by mouth in the morning, at noon, and at bedtime. 11/11/20   [provider]  carvedilol (COREG) 25 MG tablet Take 1 tablet (25 mg total) by mouth 2 (two) times daily with a meal. 11/03/20   Love, Ivan Anchors, PA-C  clopidogrel (PLAVIX) 75 MG tablet Take 1 tablet (75 mg total) by mouth daily. 11/03/20   Love,  Ivan Anchors, PA-C  Continuous Blood Gluc Sensor (FREESTYLE LIBRE 2 SENSOR) MISC USE AS DIRECTED CHANGE EVERY 14 DAYS 12/30/20   [provider]  diclofenac Sodium (VOLTAREN) 1 % GEL Apply 2 g topically 4 (four) times daily. To leg Patient not taking: Reported on 04/22/2022 11/03/20   Love, Ivan Anchors, PA-C  famotidine (PEPCID) 20 MG tablet Take 1 tablet (20 mg total) by mouth daily. 11/03/20   Love, Ivan Anchors, PA-C  insulin degludec (TRESIBA FLEXTOUCH) 100 UNIT/ML FlexTouch Pen Inject 12 Units into the skin at bedtime. Patient taking differently: Inject 8-12 Units into the skin at bedtime. Sliding scale 11/03/20   Love, Ivan Anchors, PA-C  insulin lispro (HUMALOG) 100 UNIT/ML KwikPen Inject 5 Units into the skin 3 (three) times daily. Patient taking differently: Inject 6 Units into the skin 3 (three) times daily before meals. 11/04/20   Love, Ivan Anchors, PA-C  lidocaine-prilocaine (EMLA) cream Apply topically. Patient not taking: Reported on 04/22/2022 03/02/22   [provider]  metoCLOPramide (REGLAN) 5 MG tablet Take 5 mg by mouth 3 (three) times daily before meals. Patient not taking: Reported on 04/22/2022 09/16/21   [provider]  nitroGLYCERIN (NITROSTAT) 0.4 MG SL tablet Place 1 tablet (0.4 mg total) under the tongue every 5 (five) minutes as needed for chest pain. 06/25/20   Angiulli, Lavon Paganini, PA-C  polyethylene glycol (MIRALAX / GLYCOLAX) 17 g packet Take 17 g by mouth daily. Patient taking differently: Take 17 g by  mouth daily as needed for moderate constipation. 11/04/20   Love, Ivan Anchors, PA-C  rosuvastatin (CRESTOR) 20 MG tablet Take 20 mg by mouth daily. 02/05/21   [provider]  senna-docusate (SENOKOT-S) 8.6-50 MG tablet Take 1 tablet by mouth 2 (two) times daily as needed for moderate constipation. Patient not taking: Reported on 04/22/2022 11/03/20   Love, Ivan Anchors, PA-C  tiZANidine (ZANAFLEX) 2 MG tablet Take 1 tablet (2 mg total) by mouth at bedtime. Patient not  taking: Reported on 04/22/2022 11/03/20   Bary Leriche, PA-C      Allergies    Ibuprofen, Influenza vaccines, Pneumococcal vaccine, Promethazine, Ambien [zolpidem], Codeine, Cyclobenzaprine, Hydrocodone-acetaminophen, Tape, Tramadol hcl, Wound dressing adhesive, and Oxycodone    Review of Systems   Review of Systems  Physical Exam Updated Vital Signs BP (!) 158/73   Pulse 83   Temp 97.7 F (36.5 C)   Resp 15   Wt 51.2 kg   SpO2 100%   BMI 20.65 kg/m  Physical Exam Vitals and nursing note reviewed.  Constitutional:      General: She is not in acute distress.    Appearance: She is well-developed.  HENT:     Head: Normocephalic and atraumatic.     Right Ear: External ear normal.     Left Ear: External ear normal.  Eyes:     General: No scleral icterus.       Right eye: No discharge.        Left eye: No discharge.     Conjunctiva/sclera: Conjunctivae normal.  Neck:     Trachea: No tracheal deviation.  Cardiovascular:     Rate and Rhythm: Normal rate and regular rhythm.  Pulmonary:     Effort: Pulmonary effort is normal. No respiratory distress.     Breath sounds: Normal breath sounds. No stridor. No wheezing or rales.  Abdominal:     General: Bowel sounds are normal. There is no distension.     Palpations: Abdomen is soft.     Tenderness: There is no abdominal tenderness. There is no guarding or rebound.  Musculoskeletal:        General: No tenderness or deformity.     Cervical back: Neck supple.  Skin:    General: Skin is warm and dry.     Findings: No rash.  Neurological:     Mental Status: She is alert.     Cranial Nerves: No cranial nerve deficit, dysarthria or facial asymmetry.     Sensory: No sensory deficit.     Motor: Weakness present. No abnormal muscle tone or seizure activity.     Coordination: Coordination normal.     Comments: Right-sided hemiparesis  Psychiatric:        Mood and Affect: Mood normal.     ED Results / Procedures / Treatments    Labs (all labs ordered are listed, but only abnormal results are displayed) Labs Reviewed  COMPREHENSIVE METABOLIC PANEL - Abnormal; Notable for the following components:      Result Value   Glucose, Bld 149 (*)    Creatinine, Ser 2.38 (*)    Calcium 8.5 (*)    GFR, Estimated 23 (*)    All other components within normal limits  ETHANOL  CBC WITH DIFFERENTIAL/PLATELET  PROTIME-INR  APTT  I-STAT CHEM 8, ED  CBG MONITORING, ED  I-STAT BETA HCG BLOOD, ED ([facility], [facility], [facility] ONLY)    EKG EKG Interpretation  Date/Time:  Wednesday May 05 2022 13:27:47 EST Ventricular Rate:  79 PR Interval:  186 QRS Duration: 74 QT Interval:  400 QTC Calculation: 459 R Axis:   30 Text Interpretation: Sinus rhythm Probable left atrial enlargement RSR' in V1 or V2, probably normal variant No significant change since last tracing Confirmed by Dorie Rank [id number]) on 05/05/2022 1:49:20 PM  Radiology MR BRAIN WO CONTRAST  Result Date: 05/05/2022 CLINICAL DATA:  Neuro deficit, acute, stroke suspected. Right-sided facial droop, right-sided weakness, and dysarthria. History of stroke, diabetes, chronic renal failure on dialysis, and possible multiple sclerosis. EXAM: MRI HEAD WITHOUT CONTRAST TECHNIQUE: Multiplanar, multiecho pulse sequences of the brain and surrounding structures were obtained without intravenous contrast. COMPARISON:  Head CT 05/05/2022 and MRI 01/12/2021 FINDINGS: Brain: There is no evidence of an acute infarct, mass, midline shift, or extra-axial fluid collection. Numerous chronic microhemorrhages are again seen in the cerebral hemispheres (including deep gray nuclei), brainstem, and cerebellum, stable to slightly increased in number from the prior MRI and suggestive of chronic hypertension. A chronic hemorrhagic infarct is again noted in the right putamen. Chronic lacunar infarcts are again noted in the left centrum semiovale and corona radiata, left internal and external capsules, left  thalamus, and pons. Associated wallerian degeneration involves the corpus callosum and left cerebral peduncle. Patchy and confluent T2 hyperintensities elsewhere in the cerebral white matter bilaterally have not grossly progressed from the prior motion degraded MRI and are moderately to severely advanced for age. There is mild cerebral atrophy. Vascular: Major intracranial vascular flow voids are preserved. Skull and upper cervical spine: Unremarkable bone marrow signal para Sinuses/Orbits: Bilateral cataract extraction. Paranasal sinuses and mastoid air cells are clear. Other: None. IMPRESSION: 1. No acute intracranial abnormality. 2. Age advanced white matter disease. This largely has the appearance of extensive chronic small vessel ischemic disease with multiple chronic lacunar infarcts, however a component of chronic demyelinating disease is also possible. Electronically Signed   By: Logan Bores M.D.   On: 05/05/2022 13:36   CT ANGIO HEAD NECK W WO CM (CODE STROKE)  Result Date: 05/05/2022 CLINICAL DATA:  Acute neuro deficit. Dialysis patient. Right-sided weakness. EXAM: CT ANGIOGRAPHY HEAD AND NECK TECHNIQUE: Multidetector CT imaging of the head and neck was performed using the standard protocol during bolus administration of intravenous contrast. Multiplanar CT image reconstructions and MIPs were obtained to evaluate the vascular anatomy. Carotid stenosis measurements (when applicable) are obtained utilizing NASCET criteria, using the distal internal carotid diameter as the denominator. RADIATION DOSE REDUCTION: This exam was performed according to the departmental dose-optimization program which includes automated exposure control, adjustment of the mA and/or kV according to patient size and/or use of iterative reconstruction technique. CONTRAST:  23mL OMNIPAQUE IOHEXOL 350 MG/ML SOLN COMPARISON:  CT head 04/19/2022 FINDINGS: CTA NECK FINDINGS Aortic arch: Mild atherosclerotic calcification aortic arch.  No aneurysm of the arch. Proximal great vessels patent without stenosis. Right carotid system: Right carotid system widely patent. Minimal atherosclerotic calcification right carotid bifurcation Left carotid system: Left carotid widely patent without stenosis. No significant atherosclerotic disease. Vertebral arteries: Both vertebral arteries patent to the basilar without stenosis. Skeleton: No acute abnormality. Other neck: 15 mm left thyroid nodule. Mild enlargement of the thyroid bilaterally. Additional 5 mm left lower pole thyroid nodule. No enlarged lymph nodes in the neck. Upper chest: 6 mm sub solid nodule right upper lobe anteriorly. Axial image 152 series 5. Review of the MIP images confirms the above findings CTA HEAD FINDINGS Anterior circulation: Atherosclerotic calcification in the cavernous carotid bilaterally with mild stenosis bilaterally. Anterior and  middle cerebral arteries patent bilaterally without stenosis or occlusion. No vascular malformation. Posterior circulation: Both vertebral arteries patent to the basilar. PICA patent bilaterally. Basilar widely patent. Mild-to-moderate stenosis proximal right P2 segment. Left posterior cerebral artery patent. Superior cerebellar arteries patent bilaterally. Venous sinuses: Normal venous enhancement Anatomic variants: None Review of the MIP images confirms the above findings IMPRESSION: 1. No significant carotid or vertebral artery stenosis in the neck. 2. No intracranial large vessel occlusion. Mild-to-moderate stenosis proximal right P2 segment. 3. 6 mm sub solid nodule right upper lobe. 15 mm left thyroid nodule. Thyroid ultrasound recommended. (Ref: J Am Coll Radiol. 2015 Feb;12(2): 143-50). 4. These results were called by telephone at the time of interpretation on 05/05/2022 at 1:07 pm to provider [facility] , who verbally acknowledged these results. Electronically Signed   By: Franchot Gallo M.D.   On: 05/05/2022 13:08   CT HEAD CODE  STROKE WO CONTRAST  Result Date: 05/05/2022 CLINICAL DATA:  Provided history: Neuro deficit, acute, stroke suspected. Right-sided facial droop. EXAM: CT HEAD WITHOUT CONTRAST TECHNIQUE: Contiguous axial images were obtained from the base of the skull through the vertex without intravenous contrast. RADIATION DOSE REDUCTION: This exam was performed according to the departmental dose-optimization program which includes automated exposure control, adjustment of the mA and/or kV according to patient size and/or use of iterative reconstruction technique. COMPARISON:  Brain MRI 01/12/2021.  Head CT 01/12/2021. FINDINGS: Brain: No age advanced or lobar predominant parenchymal atrophy. Moderate patchy and ill-defined hypoattenuation within the cerebral white matter. Redemonstrated small foci of hypodensity within the bilateral deep gray nuclei, reflecting a combination of prominent perivascular spaces and chronic lacunar infarcts. Wallerian degeneration extending into the left cerebral peduncle. Partially empty sella turcica. There is no acute infarct. No evidence of an intracranial mass. No chronic intracranial blood products. No extra-axial fluid collection. No midline shift. Vascular: No hyperdense vessel.  Atherosclerotic calcifications. Skull: No fracture or aggressive osseous lesion. Sinuses/Orbits: No mass or acute finding within the imaged orbits. 9 mm mucous retention cyst within the left sphenoid sinus. ASPECTS [facility] Stroke Program Early CT Score) - Ganglionic level infarction (caudate, lentiform nuclei, internal capsule, insula, M1-M3 cortex): 7 - Supraganglionic infarction (M4-M6 cortex): 3 Total score (0-10 with 10 being normal): 10 No evidence of acute intracranial abnormality. These results were communicated to Dr. Lorrin Goodell at 12:49 pmon 12/20/2023by text page via the [facility] messaging system. IMPRESSION: No evidence of acute intracranial hemorrhage or acute infarct. ASPECTS is 10. Moderate chronic  cerebral white matter disease, not appreciably changed from prior exams. Multiple chronic insults also again demonstrated within the pons. Redemonstrated small foci of hypodensity within the bilateral deep gray nuclei, reflecting a combination of prominent perivascular spaces and chronic lacunar infarcts. Partially empty sella turcica. While this finding often reflects incidental anatomic variation, alternatively it can be associated with idiopathic intracranial hypertension (pseudotumor cerebri). Electronically Signed   By: Kellie Simmering D.O.   On: 05/05/2022 12:50    Procedures Procedures  {Document cardiac monitor, telemetry assessment procedure when appropriate:1}  Medications Ordered in ED Medications  sodium chloride flush (NS) 0.9 % injection 3 mL (3 mLs Intravenous Not Given 05/05/22 1316)  iohexol (OMNIPAQUE) 350 MG/ML injection 75 mL (75 mLs Intravenous Contrast Given 05/05/22 1257)    ED Course/ Medical Decision Making/ A&P                           Medical Decision Making Amount and/or Complexity of Data  Reviewed Labs: ordered. Radiology: ordered.   ***  {Document critical care time when appropriate:1} {Document review of labs and clinical decision tools ie heart score, Chads2Vasc2 etc:1}  {Document your independent review of radiology images, and any outside records:1} {Document your discussion with family members, caretakers, and with consultants:1} {Document social determinants of health affecting pt's care:1} {Document your decision making why or why not admission, treatments were needed:1} Final Clinical Impression(s) / ED Diagnoses Final diagnoses:  None    Rx / DC Orders ED Discharge Orders     None

## 2022-05-06 ENCOUNTER — Ambulatory Visit: Payer: Medicare Other | Admitting: Occupational Therapy

## 2022-05-06 ENCOUNTER — Encounter: Payer: Self-pay | Admitting: Cardiology

## 2022-05-06 ENCOUNTER — Ambulatory Visit (HOSPITAL_COMMUNITY): Payer: Medicare Other

## 2022-05-06 ENCOUNTER — Ambulatory Visit: Payer: Medicare Other | Admitting: Speech Pathology

## 2022-05-06 ENCOUNTER — Ambulatory Visit: Payer: Medicare Other | Admitting: Physical Therapy

## 2022-05-06 NOTE — Progress Notes (Deleted)
Cardiology Office Note   Date:  05/06/2022   ID:  Hannah [name], DOB Sep 13, 1965, MRN [id number]  PCP:  Faustino Congress, NP  Cardiologist:   None Referring:  ***  No chief complaint on file.     History of Present Illness: Hannah [name] is a 56 y.o. female who presents for evaluation of chest pain.   ***    She did have an echo in 2022.  This demonstrated ***    Past Medical History:  Diagnosis Date   Cerebral infarction ([place]) 07/12/2017   2009 first one, has had a total of 4 stokes   Chronic kidney disease    tues thurs sat - dialysis   Diabetes type 2, controlled ([place])    Hemiparesis affecting right side as late effect of cerebrovascular accident (CVA) ([place])    Hyperlipidemia    Hypertension    Multiple sclerosis ([place]) 2009   Stroke [facility])    [place] as ambulation aid     Past Surgical History:  Procedure Laterality Date   A/V FISTULAGRAM Right 02/12/2021   Procedure: A/V FISTULAGRAM;  Surgeon: Marty Heck, MD;  Location: [place] [facility];  Service: Cardiovascular;  Laterality: Right;   APPENDECTOMY     AV FISTULA PLACEMENT Right 10/02/2020   Procedure: RIGHT ARTERIOVENOUS (AV) FISTULA CREATION;  Surgeon: Waynetta Sandy, MD;  Location: [place];  Service: Vascular;  Laterality: Right;   [place] Right 11/24/2020   Procedure: SECOND STAGE RIGHT ARM [place];  Surgeon: Marty Heck, MD;  Location: [place];  Service: Vascular;  Laterality: Right;   CYSTOSTOMY W/ BLADDER BIOPSY     IR FLUORO GUIDE CV LINE RIGHT  09/29/2020   IR US GUIDE VASC ACCESS RIGHT  09/29/2020   OVARIAN CYST SURGERY     PERIPHERAL VASCULAR BALLOON ANGIOPLASTY Right 02/12/2021   Procedure: PERIPHERAL VASCULAR BALLOON ANGIOPLASTY;  Surgeon: Marty Heck, MD;  Location: [place] [facility];  Service: Cardiovascular;  Laterality: Right;  UPPER ARM FISTULA   PERIPHERAL VASCULAR BALLOON ANGIOPLASTY Right 01/28/2022   Procedure:  PERIPHERAL VASCULAR BALLOON ANGIOPLASTY;  Surgeon: Marty Heck, MD;  Location: [place] [facility];  Service: Cardiovascular;  Laterality: Right;  Arm fistula   stent placed in heart       Current Outpatient Medications  Medication Sig Dispense Refill   acetaminophen (TYLENOL) 325 MG tablet Take 650 mg by mouth every 6 (six) hours as needed for moderate pain.     amLODipine (NORVASC) 10 MG tablet Take 1 tablet (10 mg total) by mouth daily. 30 tablet 0   calcium acetate (PHOSLO) 667 MG capsule Take 667 mg by mouth in the morning, at noon, and at bedtime.     carvedilol (COREG) 25 MG tablet Take 1 tablet (25 mg total) by mouth 2 (two) times daily with a meal. 60 tablet 0   clopidogrel (PLAVIX) 75 MG tablet Take 1 tablet (75 mg total) by mouth daily. 30 tablet 0   Continuous Blood Gluc Sensor (FREESTYLE LIBRE 2 SENSOR) MISC USE AS DIRECTED CHANGE EVERY 14 DAYS     diclofenac Sodium (VOLTAREN) 1 % GEL Apply 2 g topically 4 (four) times daily. To leg (Patient not taking: Reported on 04/22/2022) 350 g 0   famotidine (PEPCID) 20 MG tablet Take 1 tablet (20 mg total) by mouth daily. 30 tablet 0   insulin degludec (TRESIBA FLEXTOUCH) 100 UNIT/ML FlexTouch Pen Inject 12 Units into the skin at bedtime. (Patient taking  differently: Inject 8-12 Units into the skin at bedtime. Sliding scale) 15 mL 0   insulin lispro (HUMALOG) 100 UNIT/ML KwikPen Inject 5 Units into the skin 3 (three) times daily. (Patient taking differently: Inject 6 Units into the skin 3 (three) times daily before meals.) 15 mL 0   lidocaine-prilocaine (EMLA) cream Apply topically. (Patient not taking: Reported on 04/22/2022)     metoCLOPramide (REGLAN) 5 MG tablet Take 5 mg by mouth 3 (three) times daily before meals. (Patient not taking: Reported on 04/22/2022)     nitroGLYCERIN (NITROSTAT) 0.4 MG SL tablet Place 1 tablet (0.4 mg total) under the tongue every 5 (five) minutes as needed for chest pain. 30 tablet 12   polyethylene glycol  (MIRALAX / GLYCOLAX) 17 g packet Take 17 g by mouth daily. (Patient taking differently: Take 17 g by mouth daily as needed for moderate constipation.) 30 each 0   rosuvastatin (CRESTOR) 20 MG tablet Take 20 mg by mouth daily.     senna-docusate (SENOKOT-S) 8.6-50 MG tablet Take 1 tablet by mouth 2 (two) times daily as needed for moderate constipation. (Patient not taking: Reported on 04/22/2022) 60 tablet 0   tiZANidine (ZANAFLEX) 2 MG tablet Take 1 tablet (2 mg total) by mouth at bedtime. (Patient not taking: Reported on 04/22/2022) 30 tablet 0   No current facility-administered medications for this visit.    Allergies:   Ibuprofen, Influenza vaccines, Pneumococcal vaccine, Promethazine, Ambien [zolpidem], Codeine, Cyclobenzaprine, Hydrocodone-acetaminophen, Tape, Tramadol hcl, Wound dressing adhesive, and Oxycodone    Social History:  The patient  reports that she has never smoked. She has never used smokeless tobacco. She reports that she does not currently use alcohol. She reports that she does not use drugs.   Family History:  The patient's ***family history is not on file.    ROS:  Please see the history of present illness.   Otherwise, review of systems are positive for {NONE DEFAULTED:18576}.   All other systems are reviewed and negative.    PHYSICAL EXAM: VS:  There were no vitals taken for this visit. , BMI There is no height or weight on file to calculate BMI. GENERAL:  Well appearing HEENT:  Pupils equal round and reactive, fundi not visualized, oral mucosa unremarkable NECK:  No jugular venous distention, waveform within normal limits, carotid upstroke brisk and symmetric, no bruits, no thyromegaly LYMPHATICS:  No cervical, inguinal adenopathy LUNGS:  Clear to auscultation bilaterally BACK:  No CVA tenderness CHEST:  Unremarkable HEART:  PMI not displaced or sustained,S1 and S2 within normal limits, no S3, no S4, no clicks, no rubs, *** murmurs ABD:  Flat, positive bowel  sounds normal in frequency in pitch, no bruits, no rebound, no guarding, no midline pulsatile mass, no hepatomegaly, no splenomegaly EXT:  2 plus pulses throughout, no edema, no cyanosis no clubbing SKIN:  No rashes no nodules NEURO:  Cranial nerves II through XII grossly intact, motor grossly intact throughout PSYCH:  Cognitively intact, oriented to person place and time    EKG:  EKG {ACTION; IS/IS QPR:91638466} ordered today. The ekg ordered today demonstrates ***   Recent Labs: 05/05/2022: ALT 11; BUN 9; Creatinine, Ser 2.60; Hemoglobin 10.9; Platelets 452; Potassium 3.7; Sodium 137    Lipid Panel    Component Value Date/Time   CHOL 133 04/01/2022 1007   TRIG 52 04/01/2022 1007   HDL 80 04/01/2022 1007   CHOLHDL 1.7 04/01/2022 1007   CHOLHDL 4.5 01/13/2021 0453   VLDL 35 01/13/2021 0453  LDLCALC 41 04/01/2022 1007      Wt Readings from Last 3 Encounters:  05/05/22 112 lb 14 oz (51.2 kg)  04/01/22 109 lb (49.4 kg)  01/28/22 102 lb (46.3 kg)      Other studies Reviewed: Additional studies/ records that were reviewed today include: ***. Review of the above records demonstrates:  Please see elsewhere in the note.  ***   ASSESSMENT AND PLAN:  Precordial chest pain: ***   Current medicines are reviewed at length with the patient today.  The patient {ACTIONS; HAS/DOES NOT HAVE:19233} concerns regarding medicines.  The following changes have been made:  {PLAN; NO CHANGE:13088:s}  Labs/ tests ordered today include: *** No orders of the defined types were placed in this encounter.    Disposition:   FU with ***    Signed, Minus Breeding, MD  05/06/2022 5:25 PM    [place]

## 2022-05-07 ENCOUNTER — Ambulatory Visit: Payer: Medicare Other | Admitting: Cardiology

## 2022-05-07 DIAGNOSIS — E1122 Type 2 diabetes mellitus with diabetic chronic kidney disease: Secondary | ICD-10-CM | POA: Diagnosis not present

## 2022-05-07 DIAGNOSIS — N2581 Secondary hyperparathyroidism of renal origin: Secondary | ICD-10-CM | POA: Diagnosis not present

## 2022-05-07 DIAGNOSIS — Z992 Dependence on renal dialysis: Secondary | ICD-10-CM | POA: Diagnosis not present

## 2022-05-07 DIAGNOSIS — N186 End stage renal disease: Secondary | ICD-10-CM | POA: Diagnosis not present

## 2022-05-07 DIAGNOSIS — D631 Anemia in chronic kidney disease: Secondary | ICD-10-CM | POA: Diagnosis not present

## 2022-05-07 DIAGNOSIS — D689 Coagulation defect, unspecified: Secondary | ICD-10-CM | POA: Diagnosis not present

## 2022-05-07 DIAGNOSIS — R519 Headache, unspecified: Secondary | ICD-10-CM | POA: Diagnosis not present

## 2022-05-07 DIAGNOSIS — D509 Iron deficiency anemia, unspecified: Secondary | ICD-10-CM | POA: Diagnosis not present

## 2022-05-09 DIAGNOSIS — R519 Headache, unspecified: Secondary | ICD-10-CM | POA: Diagnosis not present

## 2022-05-09 DIAGNOSIS — D631 Anemia in chronic kidney disease: Secondary | ICD-10-CM | POA: Diagnosis not present

## 2022-05-09 DIAGNOSIS — N186 End stage renal disease: Secondary | ICD-10-CM | POA: Diagnosis not present

## 2022-05-09 DIAGNOSIS — D509 Iron deficiency anemia, unspecified: Secondary | ICD-10-CM | POA: Diagnosis not present

## 2022-05-09 DIAGNOSIS — E1122 Type 2 diabetes mellitus with diabetic chronic kidney disease: Secondary | ICD-10-CM | POA: Diagnosis not present

## 2022-05-09 DIAGNOSIS — Z992 Dependence on renal dialysis: Secondary | ICD-10-CM | POA: Diagnosis not present

## 2022-05-09 DIAGNOSIS — N2581 Secondary hyperparathyroidism of renal origin: Secondary | ICD-10-CM | POA: Diagnosis not present

## 2022-05-09 DIAGNOSIS — D689 Coagulation defect, unspecified: Secondary | ICD-10-CM | POA: Diagnosis not present

## 2022-05-11 ENCOUNTER — Encounter: Payer: Medicare Other | Admitting: Occupational Therapy

## 2022-05-11 ENCOUNTER — Ambulatory Visit: Payer: Medicare Other

## 2022-05-11 DIAGNOSIS — Z794 Long term (current) use of insulin: Secondary | ICD-10-CM | POA: Diagnosis not present

## 2022-05-11 DIAGNOSIS — E119 Type 2 diabetes mellitus without complications: Secondary | ICD-10-CM | POA: Diagnosis not present

## 2022-05-12 DIAGNOSIS — D689 Coagulation defect, unspecified: Secondary | ICD-10-CM | POA: Diagnosis not present

## 2022-05-12 DIAGNOSIS — Z992 Dependence on renal dialysis: Secondary | ICD-10-CM | POA: Diagnosis not present

## 2022-05-12 DIAGNOSIS — E1122 Type 2 diabetes mellitus with diabetic chronic kidney disease: Secondary | ICD-10-CM | POA: Diagnosis not present

## 2022-05-12 DIAGNOSIS — R519 Headache, unspecified: Secondary | ICD-10-CM | POA: Diagnosis not present

## 2022-05-12 DIAGNOSIS — N186 End stage renal disease: Secondary | ICD-10-CM | POA: Diagnosis not present

## 2022-05-12 DIAGNOSIS — N2581 Secondary hyperparathyroidism of renal origin: Secondary | ICD-10-CM | POA: Diagnosis not present

## 2022-05-12 DIAGNOSIS — D631 Anemia in chronic kidney disease: Secondary | ICD-10-CM | POA: Diagnosis not present

## 2022-05-12 DIAGNOSIS — D509 Iron deficiency anemia, unspecified: Secondary | ICD-10-CM | POA: Diagnosis not present

## 2022-05-13 ENCOUNTER — Ambulatory Visit (HOSPITAL_COMMUNITY)
Admission: RE | Admit: 2022-05-13 | Discharge: 2022-05-13 | Disposition: A | Discharge status: Home / Self Care | Payer: Medicare Other | Source: Ambulatory Visit | Attending: Vascular Surgery | Admitting: Vascular Surgery

## 2022-05-13 ENCOUNTER — Other Ambulatory Visit: Payer: Self-pay | Admitting: *Deleted

## 2022-05-13 ENCOUNTER — Ambulatory Visit (INDEPENDENT_AMBULATORY_CARE_PROVIDER_SITE_OTHER)
Admission: RE | Admit: 2022-05-13 | Discharge: 2022-05-13 | Disposition: A | Discharge status: Home / Self Care | Payer: Medicare Other | Source: Ambulatory Visit | Attending: Vascular Surgery | Admitting: Vascular Surgery

## 2022-05-13 DIAGNOSIS — Z8673 Personal history of transient ischemic attack (TIA), and cerebral infarction without residual deficits: Secondary | ICD-10-CM

## 2022-05-13 DIAGNOSIS — N186 End stage renal disease: Secondary | ICD-10-CM

## 2022-05-13 DIAGNOSIS — Z992 Dependence on renal dialysis: Secondary | ICD-10-CM | POA: Insufficient documentation

## 2022-05-14 DIAGNOSIS — Z780 Asymptomatic menopausal state: Secondary | ICD-10-CM | POA: Diagnosis not present

## 2022-05-14 DIAGNOSIS — M810 Age-related osteoporosis without current pathological fracture: Secondary | ICD-10-CM | POA: Diagnosis not present

## 2022-05-14 DIAGNOSIS — Z1231 Encounter for screening mammogram for malignant neoplasm of breast: Secondary | ICD-10-CM | POA: Diagnosis not present

## 2022-05-16 DIAGNOSIS — E1122 Type 2 diabetes mellitus with diabetic chronic kidney disease: Secondary | ICD-10-CM | POA: Diagnosis not present

## 2022-05-16 DIAGNOSIS — N186 End stage renal disease: Secondary | ICD-10-CM | POA: Diagnosis not present

## 2022-05-16 DIAGNOSIS — R519 Headache, unspecified: Secondary | ICD-10-CM | POA: Diagnosis not present

## 2022-05-16 DIAGNOSIS — D631 Anemia in chronic kidney disease: Secondary | ICD-10-CM | POA: Diagnosis not present

## 2022-05-16 DIAGNOSIS — E1022 Type 1 diabetes mellitus with diabetic chronic kidney disease: Secondary | ICD-10-CM | POA: Diagnosis not present

## 2022-05-16 DIAGNOSIS — Z992 Dependence on renal dialysis: Secondary | ICD-10-CM | POA: Diagnosis not present

## 2022-05-16 DIAGNOSIS — N2581 Secondary hyperparathyroidism of renal origin: Secondary | ICD-10-CM | POA: Diagnosis not present

## 2022-05-16 DIAGNOSIS — D689 Coagulation defect, unspecified: Secondary | ICD-10-CM | POA: Diagnosis not present

## 2022-05-16 DIAGNOSIS — D509 Iron deficiency anemia, unspecified: Secondary | ICD-10-CM | POA: Diagnosis not present

## 2022-05-18 ENCOUNTER — Other Ambulatory Visit: Payer: Self-pay

## 2022-05-18 ENCOUNTER — Ambulatory Visit: Payer: Medicare Other | Admitting: Physician Assistant

## 2022-05-18 ENCOUNTER — Ambulatory Visit: Payer: Medicare Other | Admitting: Physical Therapy

## 2022-05-18 VITALS — BP 146/71 | HR 82 | Temp 97.4°F | Resp 18 | Ht 62.0 in | Wt 108.0 lb

## 2022-05-18 DIAGNOSIS — N186 End stage renal disease: Secondary | ICD-10-CM | POA: Diagnosis not present

## 2022-05-18 DIAGNOSIS — Z992 Dependence on renal dialysis: Secondary | ICD-10-CM | POA: Diagnosis not present

## 2022-05-18 MED ORDER — SODIUM CHLORIDE 0.9% FLUSH: 3.0000 mL | Freq: Two times a day (BID) | INTRAVENOUS | Status: DC

## 2022-05-18 MED ORDER — SODIUM CHLORIDE 0.9 % IV SOLN: 250.0000 mL | INTRAVENOUS | Status: DC | PRN

## 2022-05-18 NOTE — Progress Notes (Signed)
Established Dialysis Access   History of Present Illness   Hannah [name] is a 57 y.o. ([birth year]) female who presents for re-evaluation of bleeding, reduced flow rates, and pain in the right upper extremity AV fistula.  She has a history of intermittent sharp shooting pains occurring for several months in her right upper extremity since her second stage procedure. She also has a history of chronic right upper extremity nerve pain and MS and is followed by pain management.  She has had multiple fistulograms and interventions performed on her right upper extremity fistula.  This includes DCB angioplasty of the mid fistula on 01/28/2022 for stenosis.  She also has a history of stent placement in the fistula for stenosis.  At her last visit with [place] in October 2023, her fistula pain and prolonged bleeding had not improved.  She was pending further decisions on future fistula intervention versus creating a new access.  She was referred back to our clinic today with concerns for low flow rates again.  Per nephrology, her arterial pressure bottoms out if BFR>350.  The patient states that she also still has prolonged bleeding for greater than 30 minutes after dialysis.  She also has severe, shooting pains in her fistula for the entirety of her dialysis session.  She is unsure if she wants to keep her fistula or pursue other access options.  She denies any radiation of pain into her right forearm or hand.  She denies any bleeding episodes at home.   Current Outpatient Medications  Medication Sig Dispense Refill   acetaminophen (TYLENOL) 325 MG tablet Take 650 mg by mouth every 6 (six) hours as needed for moderate pain.     amLODipine (NORVASC) 10 MG tablet Take 1 tablet (10 mg total) by mouth daily. 30 tablet 0   calcium acetate (PHOSLO) 667 MG capsule Take 667 mg by mouth in the morning, at noon, and at bedtime.     carvedilol (COREG) 25 MG tablet Take 1 tablet (25 mg total) by mouth 2 (two) times daily  with a meal. 60 tablet 0   clopidogrel (PLAVIX) 75 MG tablet Take 1 tablet (75 mg total) by mouth daily. 30 tablet 0   Continuous Blood Gluc Sensor (FREESTYLE LIBRE 2 SENSOR) MISC USE AS DIRECTED CHANGE EVERY 14 DAYS     diclofenac Sodium (VOLTAREN) 1 % GEL Apply 2 g topically 4 (four) times daily. To leg 350 g 0   famotidine (PEPCID) 20 MG tablet Take 1 tablet (20 mg total) by mouth daily. 30 tablet 0   insulin degludec (TRESIBA FLEXTOUCH) 100 UNIT/ML FlexTouch Pen Inject 12 Units into the skin at bedtime. (Patient taking differently: Inject 8-12 Units into the skin at bedtime. Sliding scale) 15 mL 0   insulin lispro (HUMALOG) 100 UNIT/ML KwikPen Inject 5 Units into the skin 3 (three) times daily. (Patient taking differently: Inject 6 Units into the skin 3 (three) times daily before meals.) 15 mL 0   lidocaine-prilocaine (EMLA) cream Apply topically.     metoCLOPramide (REGLAN) 5 MG tablet Take 5 mg by mouth 3 (three) times daily before meals.     nitroGLYCERIN (NITROSTAT) 0.4 MG SL tablet Place 1 tablet (0.4 mg total) under the tongue every 5 (five) minutes as needed for chest pain. 30 tablet 12   polyethylene glycol (MIRALAX / GLYCOLAX) 17 g packet Take 17 g by mouth daily. (Patient taking differently: Take 17 g by mouth daily as needed for moderate constipation.) 30 each 0  rosuvastatin (CRESTOR) 20 MG tablet Take 20 mg by mouth daily.     senna-docusate (SENOKOT-S) 8.6-50 MG tablet Take 1 tablet by mouth 2 (two) times daily as needed for moderate constipation. 60 tablet 0   tiZANidine (ZANAFLEX) 2 MG tablet Take 1 tablet (2 mg total) by mouth at bedtime. 30 tablet 0   Current Facility-Administered Medications  Medication Dose Route Frequency Provider Last Rate Last Admin   0.9 %  sodium chloride infusion  250 mL Intravenous PRN Marty Heck, MD       sodium chloride flush (NS) 0.9 % injection 3 mL  3 mL Intravenous Q12H Marty Heck, MD        REVIEW OF SYSTEMS (negative  unless checked):   Cardiac:  []  Chest pain or chest pressure? []  Shortness of breath upon activity? []  Shortness of breath when lying flat? []  Irregular heart rhythm?  Vascular:  []  Pain in calf, thigh, or hip brought on by walking? []  Pain in feet at night that wakes you up from your sleep? []  Blood clot in your veins? []  Leg swelling?  Pulmonary:  []  Oxygen at home? []  Productive cough? []  Wheezing?  Neurologic:  []  Sudden weakness in arms or legs? []  Sudden numbness in arms or legs? []  Sudden onset of difficult speaking or slurred speech? []  Temporary loss of vision in one eye? []  Problems with dizziness?  Gastrointestinal:  []  Blood in stool? []  Vomited blood?  Genitourinary:  []  Burning when urinating? []  Blood in urine?  Psychiatric:  []  Major depression  Hematologic:  []  Bleeding problems? []  Problems with blood clotting?  Dermatologic:  []  Rashes or ulcers?  Constitutional:  []  Fever or chills?  Ear/Nose/Throat:  []  Change in hearing? []  Nose bleeds? []  Sore throat?  Musculoskeletal:  []  Back pain? []  Joint pain? []  Muscle pain?   Physical Examination   Vitals:   05/18/22 1543  BP: (!) 146/71  Pulse: 82  Resp: 18  Temp: (!) 97.4 F (36.3 C)  TempSrc: Temporal  SpO2: 98%  Weight: 108 lb (49 kg)  Height: 5\' 2"  (1.575 m)   Body mass index is 19.75 kg/m.  General:  WDWN in NAD; vital signs documented above Gait: Not observed HENT: WNL, normocephalic Pulmonary: normal non-labored breathing  Cardiac: regular rate and rhythm Abdomen: soft, NT, no masses Skin: without rashes Vascular Exam/Pulses: Palpable radial pulses Extremities right brachiobasilic fistula with great thrill on palpation.  The fistula is not pulsatile Musculoskeletal: no muscle wasting or atrophy  Neurologic: A&O X 3;  No focal weakness or paresthesias are detected Psychiatric:  The pt has Normal affect.   Non-invasive Vascular Imaging   right Arm Access  Duplex  (05/13/2022)  Patent right brachiobasilic fistula.  Patent outflow vein stent with no stenosis.  Flow volume of 415 ml/min  Medical Decision Making   [name] is a 57 y.o. female who presents with prolonged bleeding, pain, and decreased flow rates at dialysis  The patient has a long history of issues with her right brachiobasilic fistula since it is second stage procedure.  She has a history of right upper extremity nerve pain, seemingly worsened after fistula creation.  She has had a stent placed in her fistula to treat decreased flow rates.  Most recently she had a DCB angioplasty of the basilic vein to treat stenosis causing decreased flow rates and prolonged bleeding. The patient states that her prolonged bleeding time, decreased flow rates, and pain in her right arm have  not changed at all since her last angioplasty in 2023.  She still experiences severe pain at the site of her fistula for the entirety of the dialysis session. The patient was also seen by Dr. Carlis Abbott today, and all options were given to the patient.  This includes fistulogram to evaluate for treatable areas of stenosis vs creation of right arm AV graft.  The patient was also given the option of ligation of right brachiobasilic fistula with catheter placement and creation of new access in the left arm.  At this time the patient wants to proceed with fistulogram to see if her fistula can be rescued.  Further plans by the patient will be made after fistulogram. She will be scheduled for right AV fistulogram with Dr. Carlis Abbott on 05/20/2022   Vicente Serene PA-C [facility] Office: [facility] MD: Carlis Abbott

## 2022-05-19 DIAGNOSIS — D631 Anemia in chronic kidney disease: Secondary | ICD-10-CM | POA: Diagnosis not present

## 2022-05-19 DIAGNOSIS — T7840XA Allergy, unspecified, initial encounter: Secondary | ICD-10-CM | POA: Diagnosis not present

## 2022-05-19 DIAGNOSIS — N186 End stage renal disease: Secondary | ICD-10-CM | POA: Diagnosis not present

## 2022-05-19 DIAGNOSIS — Z992 Dependence on renal dialysis: Secondary | ICD-10-CM | POA: Diagnosis not present

## 2022-05-19 DIAGNOSIS — D689 Coagulation defect, unspecified: Secondary | ICD-10-CM | POA: Diagnosis not present

## 2022-05-19 DIAGNOSIS — N2581 Secondary hyperparathyroidism of renal origin: Secondary | ICD-10-CM | POA: Diagnosis not present

## 2022-05-19 DIAGNOSIS — D509 Iron deficiency anemia, unspecified: Secondary | ICD-10-CM | POA: Diagnosis not present

## 2022-05-19 DIAGNOSIS — E1122 Type 2 diabetes mellitus with diabetic chronic kidney disease: Secondary | ICD-10-CM | POA: Diagnosis not present

## 2022-05-20 ENCOUNTER — Other Ambulatory Visit: Payer: Self-pay

## 2022-05-20 ENCOUNTER — Encounter: Payer: Medicare Other | Admitting: Speech Pathology

## 2022-05-20 ENCOUNTER — Ambulatory Visit (HOSPITAL_COMMUNITY)
Admission: RE | Admit: 2022-05-20 | Discharge: 2022-05-20 | Disposition: A | Discharge status: Home / Self Care | Payer: Medicare Other | Attending: Vascular Surgery | Admitting: Vascular Surgery

## 2022-05-20 ENCOUNTER — Encounter: Payer: Medicare Other | Admitting: Occupational Therapy

## 2022-05-20 ENCOUNTER — Encounter (HOSPITAL_COMMUNITY)
Admission: RE | Disposition: A | Discharge status: Home / Self Care | Payer: Self-pay | Source: Home / Self Care | Attending: Vascular Surgery

## 2022-05-20 ENCOUNTER — Ambulatory Visit: Payer: Medicare Other | Admitting: Physical Therapy

## 2022-05-20 DIAGNOSIS — Y832 Surgical operation with anastomosis, bypass or graft as the cause of abnormal reaction of the patient, or of later complication, without mention of misadventure at the time of the procedure: Secondary | ICD-10-CM | POA: Insufficient documentation

## 2022-05-20 DIAGNOSIS — T82898A Other specified complication of vascular prosthetic devices, implants and grafts, initial encounter: Secondary | ICD-10-CM | POA: Diagnosis not present

## 2022-05-20 DIAGNOSIS — T82510A Breakdown (mechanical) of surgically created arteriovenous fistula, initial encounter: Secondary | ICD-10-CM | POA: Insufficient documentation

## 2022-05-20 DIAGNOSIS — N186 End stage renal disease: Secondary | ICD-10-CM | POA: Diagnosis not present

## 2022-05-20 DIAGNOSIS — Z992 Dependence on renal dialysis: Secondary | ICD-10-CM | POA: Diagnosis not present

## 2022-05-20 DIAGNOSIS — N185 Chronic kidney disease, stage 5: Secondary | ICD-10-CM | POA: Diagnosis not present

## 2022-05-20 HISTORY — PX: PERIPHERAL VASCULAR BALLOON ANGIOPLASTY: CATH118281

## 2022-05-20 HISTORY — PX: A/V FISTULAGRAM: CATH118298

## 2022-05-20 LAB — POCT I-STAT, CHEM 8
BUN: 21 mg/dL — ABNORMAL HIGH (ref 6–20)
Calcium, Ion: 1.16 mmol/L (ref 1.15–1.40)
Chloride: 99 mmol/L (ref 98–111)
Creatinine, Ser: 4.8 mg/dL — ABNORMAL HIGH (ref 0.44–1.00)
Glucose, Bld: 185 mg/dL — ABNORMAL HIGH (ref 70–99)
HCT: 31 % — ABNORMAL LOW (ref 36.0–46.0)
Hemoglobin: 10.5 g/dL — ABNORMAL LOW (ref 12.0–15.0)
Potassium: 4.2 mmol/L (ref 3.5–5.1)
Sodium: 139 mmol/L (ref 135–145)
TCO2: 32 mmol/L (ref 22–32)

## 2022-05-20 SURGERY — A/V FISTULAGRAM
Anesthesia: LOCAL | Laterality: Right

## 2022-05-20 MED ORDER — SODIUM CHLORIDE 0.9% FLUSH: 3.0000 mL | INTRAVENOUS | Status: DC | PRN

## 2022-05-20 MED ORDER — OXYCODONE-ACETAMINOPHEN 5-325 MG PO TABS
1.0000 | ORAL_TABLET | Freq: Once | ORAL | Status: AC
  Administered 2022-05-20: 1 via ORAL
  Filled 2022-05-20: qty 1

## 2022-05-20 MED ORDER — HYDRALAZINE HCL 20 MG/ML IJ SOLN
INTRAMUSCULAR | Status: DC | PRN
  Administered 2022-05-20: 10 mg via INTRAVENOUS

## 2022-05-20 MED ORDER — HEPARIN SODIUM (PORCINE) 1000 UNIT/ML IJ SOLN
INTRAMUSCULAR | Status: AC
  Filled 2022-05-20: qty 10

## 2022-05-20 MED ORDER — HEPARIN (PORCINE) IN NACL 1000-0.9 UT/500ML-% IV SOLN
INTRAVENOUS | Status: DC | PRN
  Administered 2022-05-20: 500 mL

## 2022-05-20 MED ORDER — LIDOCAINE HCL (PF) 1 % IJ SOLN
INTRAMUSCULAR | Status: DC | PRN
  Administered 2022-05-20: 3 mL

## 2022-05-20 MED ORDER — HEPARIN SODIUM (PORCINE) 1000 UNIT/ML IJ SOLN
INTRAMUSCULAR | Status: DC | PRN
  Administered 2022-05-20: 3000 [IU] via INTRAVENOUS

## 2022-05-20 MED ORDER — HEPARIN (PORCINE) IN NACL 1000-0.9 UT/500ML-% IV SOLN
INTRAVENOUS | Status: AC
  Filled 2022-05-20: qty 500

## 2022-05-20 MED ORDER — LIDOCAINE HCL (PF) 1 % IJ SOLN
INTRAMUSCULAR | Status: AC
  Filled 2022-05-20: qty 30

## 2022-05-20 MED ORDER — HYDRALAZINE HCL 20 MG/ML IJ SOLN
INTRAMUSCULAR | Status: AC
  Filled 2022-05-20: qty 1

## 2022-05-20 SURGICAL SUPPLY — 14 items
BALLN MUSTANG 6.0X40 75 (BALLOONS) ×2
BALLOON MUSTANG 6.0X40 75 (BALLOONS) IMPLANT
COVER DOME SNAP 22 D (MISCELLANEOUS) ×2 IMPLANT
GUIDEWIRE ANGLED .035X150CM (WIRE) IMPLANT
KIT ENCORE 26 ADVANTAGE (KITS) IMPLANT
KIT MICROPUNCTURE NIT STIFF (SHEATH) IMPLANT
PROTECTION STATION PRESSURIZED (MISCELLANEOUS) ×2
SHEATH PINNACLE R/O II 6F 4CM (SHEATH) IMPLANT
SHEATH PROBE COVER 6X72 (BAG) ×2 IMPLANT
STATION PROTECTION PRESSURIZED (MISCELLANEOUS) ×2 IMPLANT
STOPCOCK MORSE 400PSI 3WAY (MISCELLANEOUS) ×2 IMPLANT
TRAY PV CATH (CUSTOM PROCEDURE TRAY) ×2 IMPLANT
TUBING CIL FLEX 10 FLL-RA (TUBING) ×2 IMPLANT
WIRE BENTSON .035X145CM (WIRE) IMPLANT

## 2022-05-20 NOTE — H&P (Signed)
History and Physical Interval Note:  05/20/2022 1:00 PM  Hannah [name]  has presented today for surgery, with the diagnosis of end stage renal disease.  The various methods of treatment have been discussed with the patient and family. After consideration of risks, benefits and other options for treatment, the patient has consented to  Procedure(s): A/V Fistulagram (Right) as a surgical intervention.  The patient's history has been reviewed, patient examined, no change in status, stable for surgery.  I have reviewed the patient's chart and labs.  Questions were answered to the patient's satisfaction.     Marty Heck       Established Dialysis Access     History of Present Illness    Hannah [name] is a 57 y.o. (03-08-1966) female who presents for re-evaluation of bleeding, reduced flow rates, and pain in the right upper extremity AV fistula.  She has a history of intermittent sharp shooting pains occurring for several months in her right upper extremity since her second stage procedure. She also has a history of chronic right upper extremity nerve pain and MS and is followed by pain management.   She has had multiple fistulograms and interventions performed on her right upper extremity fistula.  This includes DCB angioplasty of the mid fistula on 01/28/2022 for stenosis.  She also has a history of stent placement in the fistula for stenosis.  At her last visit with [place] in October 2023, her fistula pain and prolonged bleeding had not improved.  She was pending further decisions on future fistula intervention versus creating a new access.   She was referred back to our clinic today with concerns for low flow rates again.  Per nephrology, her arterial pressure bottoms out if BFR>350.  The patient states that she also still has prolonged bleeding for greater than 30 minutes after dialysis.  She also has severe, shooting pains in her fistula for the entirety of her dialysis session.  She is unsure  if she wants to keep her fistula or pursue other access options.  She denies any radiation of pain into her right forearm or hand.  She denies any bleeding episodes at home.           Current Outpatient Medications  Medication Sig Dispense Refill   acetaminophen (TYLENOL) 325 MG tablet Take 650 mg by mouth every 6 (six) hours as needed for moderate pain.       amLODipine (NORVASC) 10 MG tablet Take 1 tablet (10 mg total) by mouth daily. 30 tablet 0   calcium acetate (PHOSLO) 667 MG capsule Take 667 mg by mouth in the morning, at noon, and at bedtime.       carvedilol (COREG) 25 MG tablet Take 1 tablet (25 mg total) by mouth 2 (two) times daily with a meal. 60 tablet 0   clopidogrel (PLAVIX) 75 MG tablet Take 1 tablet (75 mg total) by mouth daily. 30 tablet 0   Continuous Blood Gluc Sensor (FREESTYLE LIBRE 2 SENSOR) MISC USE AS DIRECTED CHANGE EVERY 14 DAYS       diclofenac Sodium (VOLTAREN) 1 % GEL Apply 2 g topically 4 (four) times daily. To leg 350 g 0   famotidine (PEPCID) 20 MG tablet Take 1 tablet (20 mg total) by mouth daily. 30 tablet 0   insulin degludec (TRESIBA FLEXTOUCH) 100 UNIT/ML FlexTouch Pen Inject 12 Units into the skin at bedtime. (Patient taking differently: Inject 8-12 Units into the skin at bedtime. Sliding scale) 15 mL 0  insulin lispro (HUMALOG) 100 UNIT/ML KwikPen Inject 5 Units into the skin 3 (three) times daily. (Patient taking differently: Inject 6 Units into the skin 3 (three) times daily before meals.) 15 mL 0   lidocaine-prilocaine (EMLA) cream Apply topically.       metoCLOPramide (REGLAN) 5 MG tablet Take 5 mg by mouth 3 (three) times daily before meals.       nitroGLYCERIN (NITROSTAT) 0.4 MG SL tablet Place 1 tablet (0.4 mg total) under the tongue every 5 (five) minutes as needed for chest pain. 30 tablet 12   polyethylene glycol (MIRALAX / GLYCOLAX) 17 g packet Take 17 g by mouth daily. (Patient taking differently: Take 17 g by mouth daily as needed for moderate  constipation.) 30 each 0   rosuvastatin (CRESTOR) 20 MG tablet Take 20 mg by mouth daily.       senna-docusate (SENOKOT-S) 8.6-50 MG tablet Take 1 tablet by mouth 2 (two) times daily as needed for moderate constipation. 60 tablet 0   tiZANidine (ZANAFLEX) 2 MG tablet Take 1 tablet (2 mg total) by mouth at bedtime. 30 tablet 0             Current Facility-Administered Medications  Medication Dose Route Frequency Provider Last Rate Last Admin   0.9 %  sodium chloride infusion  250 mL Intravenous PRN Marty Heck, MD       sodium chloride flush (NS) 0.9 % injection 3 mL  3 mL Intravenous Q12H Marty Heck, MD          REVIEW OF SYSTEMS (negative unless checked):    Cardiac:  []  Chest pain or chest pressure? []  Shortness of breath upon activity? []  Shortness of breath when lying flat? []  Irregular heart rhythm?   Vascular:  []  Pain in calf, thigh, or hip brought on by walking? []  Pain in feet at night that wakes you up from your sleep? []  Blood clot in your veins? []  Leg swelling?   Pulmonary:  []  Oxygen at home? []  Productive cough? []  Wheezing?   Neurologic:  []  Sudden weakness in arms or legs? []  Sudden numbness in arms or legs? []  Sudden onset of difficult speaking or slurred speech? []  Temporary loss of vision in one eye? []  Problems with dizziness?   Gastrointestinal:  []  Blood in stool? []  Vomited blood?   Genitourinary:  []  Burning when urinating? []  Blood in urine?   Psychiatric:  []  Major depression   Hematologic:  []  Bleeding problems? []  Problems with blood clotting?   Dermatologic:  []  Rashes or ulcers?   Constitutional:  []  Fever or chills?   Ear/Nose/Throat:  []  Change in hearing? []  Nose bleeds? []  Sore throat?   Musculoskeletal:  []  Back pain? []  Joint pain? []  Muscle pain?     Physical Examination       Vitals:    05/18/22 1543  BP: (!) 146/71  Pulse: 82  Resp: 18  Temp: (!) 97.4 F (36.3 C)  TempSrc:  Temporal  SpO2: 98%  Weight: 108 lb (49 kg)  Height: 5\' 2"  (1.575 m)    Body mass index is 19.75 kg/m.   General:  WDWN in NAD; vital signs documented above Gait: Not observed HENT: WNL, normocephalic Pulmonary: normal non-labored breathing  Cardiac: regular rate and rhythm Abdomen: soft, NT, no masses Skin: without rashes Vascular Exam/Pulses: Palpable radial pulses Extremities right brachiobasilic fistula with great thrill on palpation.  The fistula is not pulsatile Musculoskeletal: no muscle wasting or atrophy  Neurologic: A&O X 3;  No focal weakness or paresthesias are detected Psychiatric:  The pt has Normal affect.     Non-invasive Vascular Imaging    right Arm Access Duplex  (05/13/2022)   Patent right brachiobasilic fistula.  Patent outflow vein stent with no stenosis.  Flow volume of 415 ml/min   Medical Decision Making    Hannah [name] is a 57 y.o. female who presents with prolonged bleeding, pain, and decreased flow rates at dialysis   The patient has a long history of issues with her right brachiobasilic fistula since it is second stage procedure.  She has a history of right upper extremity nerve pain, seemingly worsened after fistula creation.  She has had a stent placed in her fistula to treat decreased flow rates.  Most recently she had a DCB angioplasty of the basilic vein to treat stenosis causing decreased flow rates and prolonged bleeding. The patient states that her prolonged bleeding time, decreased flow rates, and pain in her right arm have not changed at all since her last angioplasty in 2023.  She still experiences severe pain at the site of her fistula for the entirety of the dialysis session. The patient was also seen by Dr. Carlis Abbott today, and all options were given to the patient.  This includes fistulogram to evaluate for treatable areas of stenosis vs creation of right arm AV graft.  The patient was also given the option of ligation of right  brachiobasilic fistula with catheter placement and creation of new access in the left arm.  At this time the patient wants to proceed with fistulogram to see if her fistula can be rescued.  Further plans by the patient will be made after fistulogram. She will be scheduled for right AV fistulogram with Dr. Carlis Abbott on 05/20/2022    Vicente Serene PA-C [facility] [place] Office: [facility] MD: Carlis Abbott

## 2022-05-20 NOTE — Op Note (Addendum)
    OPERATIVE NOTE   PROCEDURE: right brachiobasilic arteriovenous fistula cannulation under ultrasound guidance right arm fistulogram including central venogram right peripheral basilic vein angioplasty (6 mm x40 mm Mustang)  PRE-OPERATIVE DIAGNOSIS: Malfunctioning right arm arteriovenous fistula  POST-OPERATIVE DIAGNOSIS: same as above   SURGEON: Marty Heck, MD  ANESTHESIA: local  ESTIMATED BLOOD LOSS: 5 cc  FINDING(S): The no evidence of central venous stenosis on the right.  The previous stent in the basilic vein in the upper arm was widely patent.  She had about a 50% stenosis in the mid upper arm basilic vein that was treated with a 6 mm Mustang.  We had a difficult time getting a reflux shot as the sheath was occlusive even with manual occlusion of the fistula as well as using a balloon for distal occlusion and instead there was extravasation around the sheath into the soft tissue.  Will plan revision of right arm AV fistula with likely PTFE and TDC.  She wants to keep access in her right arm.  She has significant pain along the access site of her current basilic vein and is very tearful during dialysis.  SPECIMEN(S):  None  CONTRAST: 60 mL  INDICATIONS: Hannah [name] is a 57 y.o. female who  presents with malfunctioning right brachiobasilic arteriovenous fistula.  The patient is scheduled for right arm fistuloram.  The patient is aware the risks include but are not limited to: bleeding, infection, thrombosis of the cannulated access, and possible anaphylactic reaction to the contrast.  The patient is aware of the risks of the procedure and elects to proceed forward.  DESCRIPTION: After full informed written consent was obtained, the patient was brought back to the angiography suite and placed supine upon the angiography table.  The patient was connected to monitoring equipment.  The right arm was prepped and draped in the standard fashion for a right arm fistulogram.   Under ultrasound guidance, the right arm arteriovenous fistula was evaluated, it was patent, an image was saved.  It was cannulated with a micropuncture needle.  The microwire was advanced into the fistula and the needle was exchanged for the a microsheath, which was lodged 2 cm into the access.  The wire was removed and the sheath was connected to the IV extension tubing.  Hand injections were completed to image the access from the antecubitum up to the level of axilla.  The central venous structures were also imaged by hand injections.  We tried to get a retrograde sheath shot unsuccessful with manual pressure on the upper arm.  I elected to upsized to a short 6 French sheath and gave 3000 units IV heparin.  I then treated the mid upper arm basilic vein stenosis with a 6 mm Mustang nominal pressure for 2 minutes.  We then performed hand-injection here to get a retrograde shot.  Unfortunately the sheath had extravasation as it was occlusive.  She was having significant pain and ultimately I think revision to a new site is her best option.  A 4-0 Monocryl purse-string suture was sewn around the sheath.  The sheath was removed while tying down the suture.  A sterile bandage was applied to the puncture site.   COMPLICATIONS: None  CONDITION: Stable  Marty Heck, MD [facility] Office: [place]   05/20/2022 2:39 PM

## 2022-05-21 ENCOUNTER — Encounter (HOSPITAL_COMMUNITY): Payer: Self-pay | Admitting: Vascular Surgery

## 2022-05-24 ENCOUNTER — Ambulatory Visit: Payer: Medicare Other | Admitting: Cardiology

## 2022-05-24 ENCOUNTER — Other Ambulatory Visit: Payer: Self-pay

## 2022-05-24 DIAGNOSIS — Z992 Dependence on renal dialysis: Secondary | ICD-10-CM | POA: Diagnosis not present

## 2022-05-24 DIAGNOSIS — D509 Iron deficiency anemia, unspecified: Secondary | ICD-10-CM | POA: Diagnosis not present

## 2022-05-24 DIAGNOSIS — T82590A Other mechanical complication of surgically created arteriovenous fistula, initial encounter: Secondary | ICD-10-CM

## 2022-05-24 DIAGNOSIS — D631 Anemia in chronic kidney disease: Secondary | ICD-10-CM | POA: Diagnosis not present

## 2022-05-24 DIAGNOSIS — D689 Coagulation defect, unspecified: Secondary | ICD-10-CM | POA: Diagnosis not present

## 2022-05-24 DIAGNOSIS — E1122 Type 2 diabetes mellitus with diabetic chronic kidney disease: Secondary | ICD-10-CM | POA: Diagnosis not present

## 2022-05-24 DIAGNOSIS — N2581 Secondary hyperparathyroidism of renal origin: Secondary | ICD-10-CM | POA: Diagnosis not present

## 2022-05-24 DIAGNOSIS — T7840XA Allergy, unspecified, initial encounter: Secondary | ICD-10-CM | POA: Diagnosis not present

## 2022-05-24 DIAGNOSIS — N186 End stage renal disease: Secondary | ICD-10-CM | POA: Diagnosis not present

## 2022-05-25 ENCOUNTER — Encounter: Payer: Medicare Other | Admitting: Occupational Therapy

## 2022-05-25 ENCOUNTER — Encounter: Payer: Medicare Other | Admitting: Speech Pathology

## 2022-05-25 ENCOUNTER — Ambulatory Visit: Payer: Medicare Other

## 2022-05-26 DIAGNOSIS — D509 Iron deficiency anemia, unspecified: Secondary | ICD-10-CM | POA: Diagnosis not present

## 2022-05-26 DIAGNOSIS — N2581 Secondary hyperparathyroidism of renal origin: Secondary | ICD-10-CM | POA: Diagnosis not present

## 2022-05-26 DIAGNOSIS — Z992 Dependence on renal dialysis: Secondary | ICD-10-CM | POA: Diagnosis not present

## 2022-05-26 DIAGNOSIS — D631 Anemia in chronic kidney disease: Secondary | ICD-10-CM | POA: Diagnosis not present

## 2022-05-26 DIAGNOSIS — D689 Coagulation defect, unspecified: Secondary | ICD-10-CM | POA: Diagnosis not present

## 2022-05-26 DIAGNOSIS — N186 End stage renal disease: Secondary | ICD-10-CM | POA: Diagnosis not present

## 2022-05-26 DIAGNOSIS — E1122 Type 2 diabetes mellitus with diabetic chronic kidney disease: Secondary | ICD-10-CM | POA: Diagnosis not present

## 2022-05-26 DIAGNOSIS — T7840XA Allergy, unspecified, initial encounter: Secondary | ICD-10-CM | POA: Diagnosis not present

## 2022-05-27 ENCOUNTER — Encounter (HOSPITAL_COMMUNITY): Payer: Self-pay | Admitting: Vascular Surgery

## 2022-05-27 ENCOUNTER — Other Ambulatory Visit: Payer: Self-pay

## 2022-05-27 NOTE — Progress Notes (Addendum)
I spoke to [name] person to speak with. Hannah [name], patient's daughter. [name] reports that Hannah [name] has not complained of chest pain or shortness of breath  denies having any s/s of Covid in her household, also denies any known exposure to Covid.   Hannah [name]'s PCP is Faustino Congress, NP, cardiologhist is Dr. Ellyn Hack.  Hannah [name] has type II diabetes, [name] reports that her mother's CBGs run 120's for fasting, sometimes it is in the 200's at other times. I instructed [name] to ask Hannah [name] to take 1/2 of Treshiba insulin tonight. I instructed patient to check CBG after awaking and every 2 hours until arrival  to the hospital.  I Instructed patient if CBG is less than 70 to take 4 Glucose Tablets or 1 tube of Glucose Gel or 1/2 cup of a clear juice. Recheck CBG in 15 minutes if CBG is not over 70 call, pre- op desk at [phone] for further instructions. If  CBG is > 220 take 1/2 of Humalog Insulin SS.

## 2022-05-28 ENCOUNTER — Ambulatory Visit (HOSPITAL_COMMUNITY): Payer: Medicare Other

## 2022-05-28 ENCOUNTER — Ambulatory Visit (HOSPITAL_BASED_OUTPATIENT_CLINIC_OR_DEPARTMENT_OTHER): Payer: Medicare Other | Admitting: Anesthesiology

## 2022-05-28 ENCOUNTER — Encounter (HOSPITAL_COMMUNITY)
Admission: RE | Disposition: A | Discharge status: Home / Self Care | Payer: Self-pay | Source: Home / Self Care | Attending: Vascular Surgery

## 2022-05-28 ENCOUNTER — Encounter (HOSPITAL_COMMUNITY): Payer: Self-pay | Admitting: Vascular Surgery

## 2022-05-28 ENCOUNTER — Ambulatory Visit (HOSPITAL_COMMUNITY): Payer: Medicare Other | Admitting: Anesthesiology

## 2022-05-28 ENCOUNTER — Other Ambulatory Visit: Payer: Self-pay

## 2022-05-28 ENCOUNTER — Ambulatory Visit (HOSPITAL_COMMUNITY)
Admission: RE | Admit: 2022-05-28 | Discharge: 2022-05-28 | Disposition: A | Discharge status: Home / Self Care | Payer: Medicare Other | Attending: Vascular Surgery | Admitting: Vascular Surgery

## 2022-05-28 DIAGNOSIS — Z7902 Long term (current) use of antithrombotics/antiplatelets: Secondary | ICD-10-CM | POA: Insufficient documentation

## 2022-05-28 DIAGNOSIS — I25119 Atherosclerotic heart disease of native coronary artery with unspecified angina pectoris: Secondary | ICD-10-CM

## 2022-05-28 DIAGNOSIS — T82590A Other mechanical complication of surgically created arteriovenous fistula, initial encounter: Secondary | ICD-10-CM

## 2022-05-28 DIAGNOSIS — I517 Cardiomegaly: Secondary | ICD-10-CM | POA: Diagnosis not present

## 2022-05-28 DIAGNOSIS — I120 Hypertensive chronic kidney disease with stage 5 chronic kidney disease or end stage renal disease: Secondary | ICD-10-CM

## 2022-05-28 DIAGNOSIS — Y832 Surgical operation with anastomosis, bypass or graft as the cause of abnormal reaction of the patient, or of later complication, without mention of misadventure at the time of the procedure: Secondary | ICD-10-CM | POA: Diagnosis not present

## 2022-05-28 DIAGNOSIS — G35 Multiple sclerosis: Secondary | ICD-10-CM | POA: Diagnosis not present

## 2022-05-28 DIAGNOSIS — Z992 Dependence on renal dialysis: Secondary | ICD-10-CM | POA: Insufficient documentation

## 2022-05-28 DIAGNOSIS — I2510 Atherosclerotic heart disease of native coronary artery without angina pectoris: Secondary | ICD-10-CM | POA: Diagnosis not present

## 2022-05-28 DIAGNOSIS — E1151 Type 2 diabetes mellitus with diabetic peripheral angiopathy without gangrene: Secondary | ICD-10-CM | POA: Insufficient documentation

## 2022-05-28 DIAGNOSIS — E1122 Type 2 diabetes mellitus with diabetic chronic kidney disease: Secondary | ICD-10-CM | POA: Insufficient documentation

## 2022-05-28 DIAGNOSIS — D631 Anemia in chronic kidney disease: Secondary | ICD-10-CM | POA: Diagnosis not present

## 2022-05-28 DIAGNOSIS — N186 End stage renal disease: Secondary | ICD-10-CM | POA: Diagnosis not present

## 2022-05-28 DIAGNOSIS — T82898A Other specified complication of vascular prosthetic devices, implants and grafts, initial encounter: Secondary | ICD-10-CM | POA: Diagnosis not present

## 2022-05-28 DIAGNOSIS — I252 Old myocardial infarction: Secondary | ICD-10-CM | POA: Diagnosis not present

## 2022-05-28 DIAGNOSIS — Z955 Presence of coronary angioplasty implant and graft: Secondary | ICD-10-CM | POA: Diagnosis not present

## 2022-05-28 DIAGNOSIS — Z794 Long term (current) use of insulin: Secondary | ICD-10-CM | POA: Insufficient documentation

## 2022-05-28 DIAGNOSIS — Z452 Encounter for adjustment and management of vascular access device: Secondary | ICD-10-CM | POA: Diagnosis not present

## 2022-05-28 DIAGNOSIS — N185 Chronic kidney disease, stage 5: Secondary | ICD-10-CM | POA: Diagnosis not present

## 2022-05-28 HISTORY — PX: AV FISTULA PLACEMENT: SHX1204

## 2022-05-28 HISTORY — PX: ULTRASOUND GUIDANCE FOR VASCULAR ACCESS: SHX6516

## 2022-05-28 HISTORY — PX: INSERTION OF DIALYSIS CATHETER: SHX1324

## 2022-05-28 HISTORY — DX: Atherosclerotic heart disease of native coronary artery without angina pectoris: I25.10

## 2022-05-28 LAB — POCT I-STAT, CHEM 8
BUN: 24 mg/dL — ABNORMAL HIGH (ref 6–20)
Calcium, Ion: 1.22 mmol/L (ref 1.15–1.40)
Chloride: 98 mmol/L (ref 98–111)
Creatinine, Ser: 4.9 mg/dL — ABNORMAL HIGH (ref 0.44–1.00)
Glucose, Bld: 148 mg/dL — ABNORMAL HIGH (ref 70–99)
HCT: 30 % — ABNORMAL LOW (ref 36.0–46.0)
Hemoglobin: 10.2 g/dL — ABNORMAL LOW (ref 12.0–15.0)
Potassium: 4.1 mmol/L (ref 3.5–5.1)
Sodium: 138 mmol/L (ref 135–145)
TCO2: 30 mmol/L (ref 22–32)

## 2022-05-28 LAB — GLUCOSE, CAPILLARY
Glucose-Capillary: 153 mg/dL — ABNORMAL HIGH (ref 70–99)
Glucose-Capillary: 204 mg/dL — ABNORMAL HIGH (ref 70–99)

## 2022-05-28 SURGERY — ARTERIOVENOUS (AV) FISTULA CREATION
Anesthesia: Regional | Site: Neck | Laterality: Right

## 2022-05-28 MED ORDER — PROTAMINE SULFATE 10 MG/ML IV SOLN
INTRAVENOUS | Status: DC | PRN
  Administered 2022-05-28 (×2): 10 mg via INTRAVENOUS

## 2022-05-28 MED ORDER — FENTANYL CITRATE (PF) 100 MCG/2ML IJ SOLN
25.0000 ug | INTRAMUSCULAR | Status: DC | PRN
  Administered 2022-05-28: 50 ug via INTRAVENOUS

## 2022-05-28 MED ORDER — LIDOCAINE-EPINEPHRINE (PF) 1 %-1:200000 IJ SOLN
INTRAMUSCULAR | Status: DC | PRN
  Administered 2022-05-28: 1 mL

## 2022-05-28 MED ORDER — ONDANSETRON HCL 4 MG/2ML IJ SOLN
INTRAMUSCULAR | Status: AC
  Filled 2022-05-28: qty 2

## 2022-05-28 MED ORDER — HEPARIN 6000 UNIT IRRIGATION SOLUTION
Status: AC
  Filled 2022-05-28: qty 500

## 2022-05-28 MED ORDER — HEMOSTATIC AGENTS (NO CHARGE) OPTIME
TOPICAL | Status: DC | PRN
  Administered 2022-05-28 (×2): 1 via TOPICAL

## 2022-05-28 MED ORDER — MIDAZOLAM HCL 2 MG/2ML IJ SOLN
INTRAMUSCULAR | Status: DC | PRN
  Administered 2022-05-28: 2 mg via INTRAVENOUS

## 2022-05-28 MED ORDER — CARVEDILOL 12.5 MG PO TABS
25.0000 mg | ORAL_TABLET | Freq: Once | ORAL | Status: AC
  Administered 2022-05-28: 25 mg via ORAL
  Filled 2022-05-28: qty 2

## 2022-05-28 MED ORDER — LIDOCAINE HCL 1 % IJ SOLN
INTRAMUSCULAR | Status: AC
  Filled 2022-05-28: qty 20

## 2022-05-28 MED ORDER — 0.9 % SODIUM CHLORIDE (POUR BTL) OPTIME
TOPICAL | Status: DC | PRN
  Administered 2022-05-28: 1000 mL

## 2022-05-28 MED ORDER — LIDOCAINE HCL 1 % IJ SOLN
INTRAMUSCULAR | Status: DC | PRN
  Administered 2022-05-28: 15 mL

## 2022-05-28 MED ORDER — ACETAMINOPHEN 325 MG PO TABS: 650.0000 mg | ORAL_TABLET | Freq: Four times a day (QID) | ORAL | Status: DC | PRN

## 2022-05-28 MED ORDER — SODIUM CHLORIDE 0.9 % IV SOLN: INTRAVENOUS | Status: DC

## 2022-05-28 MED ORDER — ONDANSETRON HCL 4 MG/2ML IJ SOLN
4.0000 mg | Freq: Once | INTRAMUSCULAR | Status: AC | PRN
  Administered 2022-05-28: 4 mg via INTRAVENOUS

## 2022-05-28 MED ORDER — FENTANYL CITRATE (PF) 250 MCG/5ML IJ SOLN
INTRAMUSCULAR | Status: DC | PRN
  Administered 2022-05-28 (×2): 25 ug via INTRAVENOUS

## 2022-05-28 MED ORDER — ORAL CARE MOUTH RINSE: 15.0000 mL | Freq: Once | OROMUCOSAL | Status: AC

## 2022-05-28 MED ORDER — FENTANYL CITRATE (PF) 100 MCG/2ML IJ SOLN
INTRAMUSCULAR | Status: AC
  Filled 2022-05-28: qty 2

## 2022-05-28 MED ORDER — HEPARIN SODIUM (PORCINE) 1000 UNIT/ML IJ SOLN
INTRAMUSCULAR | Status: AC
  Filled 2022-05-28: qty 10

## 2022-05-28 MED ORDER — INSULIN ASPART 100 UNIT/ML IJ SOLN: 0.0000 [IU] | INTRAMUSCULAR | Status: DC | PRN

## 2022-05-28 MED ORDER — LIDOCAINE-EPINEPHRINE (PF) 1.5 %-1:200000 IJ SOLN
INTRAMUSCULAR | Status: DC | PRN
  Administered 2022-05-28: 24 mL via PERINEURAL

## 2022-05-28 MED ORDER — LIDOCAINE-EPINEPHRINE (PF) 1 %-1:200000 IJ SOLN
INTRAMUSCULAR | Status: AC
  Filled 2022-05-28: qty 30

## 2022-05-28 MED ORDER — HEPARIN SODIUM (PORCINE) 1000 UNIT/ML IJ SOLN
INTRAMUSCULAR | Status: DC | PRN
  Administered 2022-05-28: 3.2 [IU] via INTRAVENOUS

## 2022-05-28 MED ORDER — CEFAZOLIN SODIUM-DEXTROSE 2-4 GM/100ML-% IV SOLN
2.0000 g | INTRAVENOUS | Status: AC
  Administered 2022-05-28: 2 g via INTRAVENOUS
  Filled 2022-05-28: qty 100

## 2022-05-28 MED ORDER — CHLORHEXIDINE GLUCONATE 4 % EX LIQD: 60.0000 mL | Freq: Once | CUTANEOUS | Status: DC

## 2022-05-28 MED ORDER — CHLORHEXIDINE GLUCONATE 0.12 % MT SOLN
15.0000 mL | Freq: Once | OROMUCOSAL | Status: AC
  Administered 2022-05-28: 15 mL via OROMUCOSAL
  Filled 2022-05-28: qty 15

## 2022-05-28 MED ORDER — TRAMADOL HCL 50 MG PO TABS: 50.0000 mg | ORAL_TABLET | Freq: Four times a day (QID) | ORAL | 0 refills | Status: DC | PRN

## 2022-05-28 MED ORDER — PHENYLEPHRINE HCL-NACL 20-0.9 MG/250ML-% IV SOLN
INTRAVENOUS | Status: DC | PRN
  Administered 2022-05-28: 30 ug/min via INTRAVENOUS

## 2022-05-28 MED ORDER — HEPARIN 6000 UNIT IRRIGATION SOLUTION
Status: DC | PRN
  Administered 2022-05-28: 1

## 2022-05-28 MED ORDER — PROPOFOL 500 MG/50ML IV EMUL
INTRAVENOUS | Status: DC | PRN
  Administered 2022-05-28: 50 ug/kg/min via INTRAVENOUS

## 2022-05-28 MED ORDER — HEPARIN SODIUM (PORCINE) 1000 UNIT/ML IJ SOLN
INTRAMUSCULAR | Status: DC | PRN
  Administered 2022-05-28: 2000 [IU] via INTRAVENOUS

## 2022-05-28 SURGICAL SUPPLY — 58 items
ARMBAND PINK RESTRICT EXTREMIT (MISCELLANEOUS) ×6 IMPLANT
BAG COUNTER SPONGE SURGICOUNT (BAG) ×3 IMPLANT
BAG DECANTER FOR FLEXI CONT (MISCELLANEOUS) ×3 IMPLANT
BIOPATCH RED 1 DISK 7.0 (GAUZE/BANDAGES/DRESSINGS) ×3 IMPLANT
BLADE CLIPPER SURG (BLADE) ×3 IMPLANT
CANISTER SUCT 3000ML PPV (MISCELLANEOUS) ×3 IMPLANT
CATH EMB 4FR 40CM (CATHETERS) IMPLANT
CATH EMB 5FR 80CM (CATHETERS) IMPLANT
CATH PALINDROME-P 19CM W/VT (CATHETERS) IMPLANT
CLIP VESOCCLUDE MED 6/CT (CLIP) ×3 IMPLANT
CLIP VESOCCLUDE SM WIDE 6/CT (CLIP) ×3 IMPLANT
COVER PROBE W GEL 5X96 (DRAPES) ×3 IMPLANT
COVER SURGICAL LIGHT HANDLE (MISCELLANEOUS) ×3 IMPLANT
DERMABOND ADVANCED .7 DNX12 (GAUZE/BANDAGES/DRESSINGS) ×3 IMPLANT
DRAPE C-ARM 42X72 X-RAY (DRAPES) ×3 IMPLANT
DRAPE CHEST BREAST 15X10 FENES (DRAPES) ×3 IMPLANT
DRSG COVADERM 4X14 (GAUZE/BANDAGES/DRESSINGS) IMPLANT
ELECT REM PT RETURN 9FT ADLT (ELECTROSURGICAL) ×3
ELECTRODE REM PT RTRN 9FT ADLT (ELECTROSURGICAL) ×3 IMPLANT
GAUZE 4X4 16PLY ~~LOC~~+RFID DBL (SPONGE) ×3 IMPLANT
GLOVE BIO SURGEON STRL SZ7.5 (GLOVE) ×3 IMPLANT
GLOVE BIOGEL PI IND STRL 8 (GLOVE) ×3 IMPLANT
GOWN STRL REUS W/ TWL LRG LVL3 (GOWN DISPOSABLE) ×6 IMPLANT
GOWN STRL REUS W/ TWL XL LVL3 (GOWN DISPOSABLE) ×6 IMPLANT
GOWN STRL REUS W/TWL LRG LVL3 (GOWN DISPOSABLE) ×6
GOWN STRL REUS W/TWL XL LVL3 (GOWN DISPOSABLE) ×6
GRAFT GORETEX STRT 4-7X45 (Vascular Products) IMPLANT
HEMOSTAT SPONGE AVITENE ULTRA (HEMOSTASIS) IMPLANT
KIT BASIN OR (CUSTOM PROCEDURE TRAY) ×3 IMPLANT
KIT TURNOVER KIT B (KITS) ×3 IMPLANT
NDL 18GX1X1/2 (RX/OR ONLY) (NEEDLE) ×3 IMPLANT
NDL HYPO 25GX1X1/2 BEV (NEEDLE) ×3 IMPLANT
NEEDLE 18GX1X1/2 (RX/OR ONLY) (NEEDLE) ×3 IMPLANT
NEEDLE HYPO 25GX1X1/2 BEV (NEEDLE) ×3 IMPLANT
NS IRRIG 1000ML POUR BTL (IV SOLUTION) ×3 IMPLANT
PACK BASIC III (CUSTOM PROCEDURE TRAY) ×3
PACK CV ACCESS (CUSTOM PROCEDURE TRAY) ×3 IMPLANT
PACK SRG BSC III STRL LF ECLPS (CUSTOM PROCEDURE TRAY) ×3 IMPLANT
PAD ARMBOARD 7.5X6 YLW CONV (MISCELLANEOUS) ×6 IMPLANT
SET MICROPUNCTURE 5F STIFF (MISCELLANEOUS) IMPLANT
SLING ARM FOAM STRAP MED (SOFTGOODS) IMPLANT
SOAP 2 % CHG 4 OZ (WOUND CARE) ×3 IMPLANT
SPIKE FLUID TRANSFER (MISCELLANEOUS) ×3 IMPLANT
SUT ETHILON 3 0 PS 1 (SUTURE) ×3 IMPLANT
SUT MNCRL AB 4-0 PS2 18 (SUTURE) ×3 IMPLANT
SUT PROLENE 5 0 C 1 24 (SUTURE) IMPLANT
SUT PROLENE 6 0 BV (SUTURE) ×3 IMPLANT
SUT VIC AB 3-0 SH 27 (SUTURE) ×6
SUT VIC AB 3-0 SH 27X BRD (SUTURE) ×3 IMPLANT
SUT VIC AB 4-0 PS2 18 (SUTURE) IMPLANT
SYR 10ML LL (SYRINGE) ×3 IMPLANT
SYR 20ML LL LF (SYRINGE) ×6 IMPLANT
SYR 5ML LL (SYRINGE) ×3 IMPLANT
SYR CONTROL 10ML LL (SYRINGE) ×3 IMPLANT
TOWEL GREEN STERILE (TOWEL DISPOSABLE) ×3 IMPLANT
TOWEL GREEN STERILE FF (TOWEL DISPOSABLE) ×3 IMPLANT
UNDERPAD 30X36 HEAVY ABSORB (UNDERPADS AND DIAPERS) ×3 IMPLANT
WATER STERILE IRR 1000ML POUR (IV SOLUTION) ×3 IMPLANT

## 2022-05-28 NOTE — Discharge Instructions (Addendum)
[facility]  Discharge Instructions  AV Fistula or Graft Surgery for Dialysis Access  Please refer to the following instructions for your post-procedure care. Your surgeon or physician assistant will discuss any changes with you.  Activity  You may drive the day following your surgery, if you are comfortable and no longer taking prescription pain medication. Resume full activity as the soreness in your incision resolves.  Bathing/Showering  You may shower after you go home. Keep your incision dry for 48 hours. Do not soak in a bathtub, hot tub, or swim until the incision heals completely. You may not shower if you have a hemodialysis catheter.  Incision Care  Clean your incision with mild soap and water after 48 hours. Pat the area dry with a clean towel. You do not need a bandage unless otherwise instructed. Do not apply any ointments or creams to your incision. You may have skin glue on your incision. Do not peel it off. It will come off on its own in about one week. Your arm may swell a bit after surgery. To reduce swelling use pillows to elevate your arm so it is above your heart. Your doctor will tell you if you need to lightly wrap your arm with an ACE bandage.  Diet  Resume your normal diet. There are not special food restrictions following this procedure. In order to heal from your surgery, it is CRITICAL to get adequate nutrition. Your body requires vitamins, minerals, and protein. Vegetables are the best source of vitamins and minerals. Vegetables also provide the perfect balance of protein. Processed food has little nutritional value, so try to avoid this.  Medications  Resume taking all of your medications. If your incision is causing pain, you may take over-the counter pain relievers such as acetaminophen (Tylenol). If you were prescribed a stronger pain medication, please be aware these medications can cause nausea and constipation. Prevent  nausea by taking the medication with a snack or meal. Avoid constipation by drinking plenty of fluids and eating foods with high amount of fiber, such as fruits, vegetables, and grains.  Do not take Tylenol if you are taking prescription pain medications.  Follow up Your surgeon may want to see you in the office following your access surgery. If so, this will be arranged at the time of your surgery.  Please call us immediately for any of the following conditions:  Increased pain, redness, drainage (pus) from your incision site Fever of 101 degrees or higher Severe or worsening pain at your incision site Hand pain or numbness.  Reduce your risk of vascular disease:  Stop smoking. If you would like help, call [facility] at 1-800-QUIT-NOW (507) 815-1455) or [place] at [place] your cholesterol Maintain a desired weight Control your diabetes Keep your blood pressure down  Dialysis  It will take several weeks to several months for your new dialysis access to be ready for use. Your surgeon will determine when it is okay to use it. Your nephrologist will continue to direct your dialysis. You can continue to use your Permcath until your new access is ready for use.   05/28/2022 Judith Demps [id number] 1965-06-07  Surgeon(s): Marty Heck, MD  Procedure(s): RIGHT ARM ARTERIOVENOUS (AV) FISTULA CREATION REVISION WITH GORETEX GRAFT ULTRASOUND GUIDED INSERTION OF TUNNELED DIALYSIS CATHETER IN THE RIGHT INTERNAL JUGULAR   May stick graft immediately   May stick graft on designated area only:   x Do not stick graft for 6  weeks    If you have any questions, please call the office at [phone].  Risk Evaluation and Mitigation Strategies (REMS)  The medication you are ordering is associated with Risk Evaluation and Mitigation Strategies (REMS) and has Elements to [employer] Use (ETASU).   Please use the link below to the FDA website to assure all ETASU are  addressed for this medication.  [email or web address]

## 2022-05-28 NOTE — Transfer of Care (Signed)
Immediate Anesthesia Transfer of Care Note  Patient: Hannah [name]  Procedure(s) Performed: RIGHT ARM ARTERIOVENOUS (AV) FISTULA CREATION REVISION WITH GORETEX GRAFT (Right: Arm Upper) ULTRASOUND GUIDED INSERTION OF TUNNELED DIALYSIS CATHETER IN THE RIGHT INTERNAL JUGULAR (Right: Chest)  Patient Location: PACU  Anesthesia Type:MAC  Level of Consciousness: awake, alert , and oriented  Airway & Oxygen Therapy: Patient Spontanous Breathing and Patient connected to nasal cannula oxygen  Post-op Assessment: Report given to RN and Post -op Vital signs reviewed and stable  Post vital signs: Reviewed and stable  Last Vitals:  Vitals Value Taken Time  BP 92/61 05/28/22 1030  Temp 36.1 C 05/28/22 1030  Pulse 69 05/28/22 1044  Resp 16 05/28/22 1044  SpO2 91 % 05/28/22 1044  Vitals shown include unvalidated device data.  Last Pain:  Vitals:   05/28/22 0613  TempSrc: Oral  PainSc:          Complications: No notable events documented.

## 2022-05-28 NOTE — Anesthesia Preprocedure Evaluation (Addendum)
Anesthesia Evaluation  Patient identified by MRN, date of birth, ID band Patient awake    Reviewed: Allergy & Precautions, NPO status , Patient's Chart, lab work & pertinent test results  Airway Mallampati: III  TM Distance: >3 FB Neck ROM: Full    Dental no notable dental hx.    Pulmonary neg pulmonary ROS   Pulmonary exam normal        Cardiovascular hypertension, + angina  + CAD, + Past MI, + Cardiac Stents and + Peripheral Vascular Disease  Normal cardiovascular exam Rate:Normal     Neuro/Psych Multiple sclerosis CVA, Residual Symptoms  negative psych ROS   GI/Hepatic negative GI ROS, Neg liver ROS,,,  Endo/Other  diabetes, Insulin Dependent    Renal/GU ESRF and DialysisRenal diseaseOn HD M, W, F     Musculoskeletal negative musculoskeletal ROS (+)    Abdominal   Peds  Hematology  (+) Blood dyscrasia (On Plavix), anemia   Anesthesia Other Findings Malfunctioning right arm arteriovenous fistula ESRD  Reproductive/Obstetrics                              Anesthesia Physical Anesthesia Plan  ASA: 4  Anesthesia Plan: Regional   Post-op Pain Management:    Induction: Intravenous  PONV Risk Score and Plan: 2 and Ondansetron, Dexamethasone, Propofol infusion and Treatment may vary due to age or medical condition  Airway Management Planned: Simple Face Mask  Additional Equipment:   Intra-op Plan:   Post-operative Plan:   Informed Consent: I have reviewed the patients History and Physical, chart, labs and discussed the procedure including the risks, benefits and alternatives for the proposed anesthesia with the patient or authorized representative who has indicated his/her understanding and acceptance.     Dental advisory given  Plan Discussed with: CRNA  Anesthesia Plan Comments:          Anesthesia Quick Evaluation

## 2022-05-28 NOTE — Anesthesia Procedure Notes (Signed)
Procedure Name: MAC Date/Time: 05/28/2022 7:45 AM  Performed by: Griffin Dakin, CRNAPre-anesthesia Checklist: Patient identified, Emergency Drugs available, Suction available, Patient being monitored and Timeout performed Patient Re-evaluated:Patient Re-evaluated prior to induction Oxygen Delivery Method: Nasal cannula Induction Type: IV induction Placement Confirmation: positive ETCO2 and breath sounds checked- equal and bilateral Dental Injury: Teeth and Oropharynx as per pre-operative assessment

## 2022-05-28 NOTE — Anesthesia Procedure Notes (Signed)
Anesthesia Regional Block: Supraclavicular block   Pre-Anesthetic Checklist: , timeout performed,  Correct Patient, Correct Site, Correct Laterality,  Correct Procedure, Correct Position, site marked,  Risks and benefits discussed,  Surgical consent,  Pre-op evaluation,  At surgeon's request and post-op pain management  Laterality: Right  Prep: chloraprep       Needles:  Injection technique: Single-shot  Needle Type: Echogenic Stimulator Needle     Needle Length: 9cm  Needle Gauge: 21     Additional Needles:   Procedures:,,,, ultrasound used (permanent image in chart),,    Narrative:  Start time: 05/28/2022 7:15 AM End time: 05/28/2022 7:25 AM Injection made incrementally with aspirations every 5 mL.  Performed by: Personally  Anesthesiologist: Murvin Natal, MD  Additional Notes: Functioning IV was confirmed and monitors were applied.  A timeout was performed. Sterile prep, hand hygiene and sterile gloves were used. A 63mm 21ga Arrow echogenic stimulator needle was used. Negative aspiration and negative test dose prior to incremental administration of local anesthetic. The patient tolerated the procedure well.  Ultrasound guidance: relevent anatomy identified, needle position confirmed, local anesthetic spread visualized around nerve(s), vascular puncture avoided.  Image printed for medical record.

## 2022-05-28 NOTE — H&P (Signed)
History and Physical Interval Note:  05/28/2022 7:21 AM  Hannah [name]  has presented today for surgery, with the diagnosis of Malfunctioning right arm arteriovenous fistula; ESRD.  The various methods of treatment have been discussed with the patient and family. After consideration of risks, benefits and other options for treatment, the patient has consented to  Procedure(s): RIGHT ARM ARTERIOVENOUS (AV) FISTULA CREATION (Right) INSERTION OF TUNNELED DIALYSIS CATHETER (N/A) as a surgical intervention.  The patient's history has been reviewed, patient examined, no change in status, stable for surgery.  I have reviewed the patient's chart and labs.  Questions were answered to the patient's satisfaction.    Plan right arm AVF revision with likely jump graft and TDC.  Hannah [name]       Established Dialysis Access     History of Present Illness    Hannah [name] is a 57 y.o. ([birth year]) female who presents for re-evaluation of bleeding, reduced flow rates, and pain in the right upper extremity AV fistula.  She has a history of intermittent sharp shooting pains occurring for several months in her right upper extremity since her second stage procedure. She also has a history of chronic right upper extremity nerve pain and MS and is followed by pain management.   She has had multiple fistulograms and interventions performed on her right upper extremity fistula.  This includes DCB angioplasty of the mid fistula on 01/28/2022 for stenosis.  She also has a history of stent placement in the fistula for stenosis.  At her last visit with [place] in October 2023, her fistula pain and prolonged bleeding had not improved.  She was pending further decisions on future fistula intervention versus creating a new access.   She was referred back to our clinic today with concerns for low flow rates again.  Per nephrology, her arterial pressure bottoms out if BFR>350.  The patient states that she also still has  prolonged bleeding for greater than 30 minutes after dialysis.  She also has severe, shooting pains in her fistula for the entirety of her dialysis session.  She is unsure if she wants to keep her fistula or pursue other access options.  She denies any radiation of pain into her right forearm or hand.  She denies any bleeding episodes at home.                Current Outpatient Medications  Medication Sig Dispense Refill   acetaminophen (TYLENOL) 325 MG tablet Take 650 mg by mouth every 6 (six) hours as needed for moderate pain.       amLODipine (NORVASC) 10 MG tablet Take 1 tablet (10 mg total) by mouth daily. 30 tablet 0   calcium acetate (PHOSLO) 667 MG capsule Take 667 mg by mouth in the morning, at noon, and at bedtime.       carvedilol (COREG) 25 MG tablet Take 1 tablet (25 mg total) by mouth 2 (two) times daily with a meal. 60 tablet 0   clopidogrel (PLAVIX) 75 MG tablet Take 1 tablet (75 mg total) by mouth daily. 30 tablet 0   Continuous Blood Gluc Sensor (FREESTYLE LIBRE 2 SENSOR) MISC USE AS DIRECTED CHANGE EVERY 14 DAYS       diclofenac Sodium (VOLTAREN) 1 % GEL Apply 2 g topically 4 (four) times daily. To leg 350 g 0   famotidine (PEPCID) 20 MG tablet Take 1 tablet (20 mg total) by mouth daily. 30 tablet 0   insulin degludec (TRESIBA FLEXTOUCH) 100 UNIT/ML  FlexTouch Pen Inject 12 Units into the skin at bedtime. (Patient taking differently: Inject 8-12 Units into the skin at bedtime. Sliding scale) 15 mL 0   insulin lispro (HUMALOG) 100 UNIT/ML KwikPen Inject 5 Units into the skin 3 (three) times daily. (Patient taking differently: Inject 6 Units into the skin 3 (three) times daily before meals.) 15 mL 0   lidocaine-prilocaine (EMLA) cream Apply topically.       metoCLOPramide (REGLAN) 5 MG tablet Take 5 mg by mouth 3 (three) times daily before meals.       nitroGLYCERIN (NITROSTAT) 0.4 MG SL tablet Place 1 tablet (0.4 mg total) under the tongue every 5 (five) minutes as needed for chest  pain. 30 tablet 12   polyethylene glycol (MIRALAX / GLYCOLAX) 17 g packet Take 17 g by mouth daily. (Patient taking differently: Take 17 g by mouth daily as needed for moderate constipation.) 30 each 0   rosuvastatin (CRESTOR) 20 MG tablet Take 20 mg by mouth daily.       senna-docusate (SENOKOT-S) 8.6-50 MG tablet Take 1 tablet by mouth 2 (two) times daily as needed for moderate constipation. 60 tablet 0   tiZANidine (ZANAFLEX) 2 MG tablet Take 1 tablet (2 mg total) by mouth at bedtime. 30 tablet 0                     Current Facility-Administered Medications  Medication Dose Route Frequency Provider Last Rate Last Admin   0.9 %  sodium chloride infusion  250 mL Intravenous PRN Hannah Heck, MD       sodium chloride flush (NS) 0.9 % injection 3 mL  3 mL Intravenous Q12H Hannah Heck, MD          REVIEW OF SYSTEMS (negative unless checked):    Cardiac:  []  Chest pain or chest pressure? []  Shortness of breath upon activity? []  Shortness of breath when lying flat? []  Irregular heart rhythm?   Vascular:  []  Pain in calf, thigh, or hip brought on by walking? []  Pain in feet at night that wakes you up from your sleep? []  Blood clot in your veins? []  Leg swelling?   Pulmonary:  []  Oxygen at home? []  Productive cough? []  Wheezing?   Neurologic:  []  Sudden weakness in arms or legs? []  Sudden numbness in arms or legs? []  Sudden onset of difficult speaking or slurred speech? []  Temporary loss of vision in one eye? []  Problems with dizziness?   Gastrointestinal:  []  Blood in stool? []  Vomited blood?   Genitourinary:  []  Burning when urinating? []  Blood in urine?   Psychiatric:  []  Major depression   Hematologic:  []  Bleeding problems? []  Problems with blood clotting?   Dermatologic:  []  Rashes or ulcers?   Constitutional:  []  Fever or chills?   Ear/Nose/Throat:  []  Change in hearing? []  Nose bleeds? []  Sore throat?   Musculoskeletal:  []  Back  pain? []  Joint pain? []  Muscle pain?     Physical Examination         Vitals:    05/18/22 1543  BP: (!) 146/71  Pulse: 82  Resp: 18  Temp: (!) 97.4 F (36.3 C)  TempSrc: Temporal  SpO2: 98%  Weight: 108 lb (49 kg)  Height: 5\' 2"  (1.575 m)    Body mass index is 19.75 kg/m.   General:  WDWN in NAD; vital signs documented above Gait: Not observed HENT: WNL, normocephalic Pulmonary: normal non-labored breathing  Cardiac: regular rate  and rhythm Abdomen: soft, NT, no masses Skin: without rashes Vascular Exam/Pulses: Palpable radial pulses Extremities right brachiobasilic fistula with great thrill on palpation.  The fistula is not pulsatile Musculoskeletal: no muscle wasting or atrophy       Neurologic: A&O X 3;  No focal weakness or paresthesias are detected Psychiatric:  The pt has Normal affect.     Non-invasive Vascular Imaging    right Arm Access Duplex  (05/13/2022)   Patent right brachiobasilic fistula.  Patent outflow vein stent with no stenosis.  Flow volume of 415 ml/min   Medical Decision Making    Hannah [name] is a 57 y.o. female who presents with prolonged bleeding, pain, and decreased flow rates at dialysis   The patient has a long history of issues with her right brachiobasilic fistula since it is second stage procedure.  She has a history of right upper extremity nerve pain, seemingly worsened after fistula creation.  She has had a stent placed in her fistula to treat decreased flow rates.  Most recently she had a DCB angioplasty of the basilic vein to treat stenosis causing decreased flow rates and prolonged bleeding. The patient states that her prolonged bleeding time, decreased flow rates, and pain in her right arm have not changed at all since her last angioplasty in 2023.  She still experiences severe pain at the site of her fistula for the entirety of the dialysis session. The patient was also seen by Dr. Carlis Abbott today, and all options were given to the  patient.  This includes fistulogram to evaluate for treatable areas of stenosis vs creation of right arm AV graft.  The patient was also given the option of ligation of right brachiobasilic fistula with catheter placement and creation of new access in the left arm.  At this time the patient wants to proceed with fistulogram to see if her fistula can be rescued.  Further plans by the patient will be made after fistulogram. She will be scheduled for right AV fistulogram with Dr. Carlis Abbott on 05/20/2022    Vicente Serene PA-C [facility] [place] Office: [facility] MD: Carlis Abbott

## 2022-05-28 NOTE — Op Note (Signed)
Date: May 28, 2022  Preoperative diagnosis: 1.  End-stage renal disease 2.  Malfunctioning right brachiobasilic arteriovenous fistula  Postoperative diagnosis: Same  Procedure: 1.  Ultrasound-guided access right internal jugular vein 2.  Placement of right internal jugular vein tunneled dialysis catheter (19 cm palindrome) 3.  Revision of right upper arm brachiobasilic AV fistula with a new jump graft from the brachial artery to the upper arm basilic vein using Gore-Tex graft  Surgeon: Dr. Marty Heck, MD  Assistant: Vicente Serene, PA  Indications: 57 year old female with end-stage renal disease currently using a right brachiobasilic AV fistula.  This has been malfunctioning and we did a fistulogram last week showing this was heavily diseased adjacent to the artery anastomosis.  I recommended TDC placement with likely revision with a Gore-Tex graft.  She presents after risks benefits discussed.  Findings:  Successful ultrasound-guided access of the right internal jugular vein with a 19 cm palindrome catheter with the tip in the right atrium.  This flushed and aspirated easily. Revision of right upper arm AV fistula with a new Gore-Tex graft using a 4 mm x 7 mm tapered graft from the brachial artery just above antecubital fossa to the upper arm basilic vein where it was healthy.  Palpable radial pulse.  Palpable thrill at completion.  Anesthesia: Regional block with MAC  Details: Patient was taken to the operating room after informed consent was obtained.  Placed on the operative table in the supine position.  The right arm and bilateral necks were prepped and draped in standard sterile fashion.  Antibiotics were given.  Timeout performed.  Initially evaluated the right internal jugular vein with ultrasound, it was patent, an image was saved.  This was accessed under ultrasound guidance with a 18-gauge needle and got venous backbleeding.  I then advanced a J-wire under  fluoroscopic guidance into the right atrium.  I then measured a 19 cm palindrome catheter on the chest wall.  I made a counterincision on the chest wall and then tunneled the catheter from the chest wall exit site to the IJ stick site making sure the cuff was under the skin.  I then sequentially dilated over the wire and placed a large dilator peel-away sheath into the right atrium.  The dilator and wire were removed.  I then advanced the tip of the catheter into the right atrium and peeled the sheath away.  I repositioned the catheter under fluoroscopy to make sure there were no kinks.  This flushed and aspirated easily. Then turned our attention to the right arm that had already been prepped and draped in the field.  I used the SonoSite ultrasound to mark the brachial artery just above the antecubitum and also the basilic vein in the upper arm where it appeared healthy on recent fistulogram.  I made two separate incisions one over the brachial artery and another transverse incision over the basilic vein in the upper arm.  Initially dissected down and got the brachial artery out and controlled and placed Vesseloops for a new anastomosis to the brachial artery.  I then dissected out the basilic vein in the upper arm just distal to the stent that had been placed previously.  I then brought a tunneler on the field and tunneled it for a new Gore-Tex graft.  The patient was given 2000 units of IV heparin.  I passed the graft through the tunneler with the 4 mm end oriented to the brachial artery.  I controlled the brachial artery with Vesseloops  and then arteriotomy was made with 11 blade scalpel Potts scissors.  An end to side anastomosis was sewn to the brachial artery with 6-0 Prolene parachute technique and this was de-aired prior to completion with the help of my assistant.  I then went to the upper arm basilic vein where we controlled this with fistula clamps and the vein was transected.  I then ligated the old  basilic vein on the arterial side to exclude the diseased segment with 5-0 Prolene running stitch.  I actually ended up using a balloon for distal control with a #5 Fogarty given there was a stent that I did not want to disrupt.  A new primary end-to-end anastomosis was sewn from the new Gore-Tex graft to the existing basilic vein fistula in the upper arm with 6-0 Prolene parachute technique.  We de-aired the graft prior to completion.  Once we came off clamps there was a good thrill.  There was a palpable radial pulse.  Surgicel snow was used for hemostasis.  Incisions were irrigated out we gave protamine.  This skin was closed with 3-0 Vicryl 4-0 Monocryl and Dermabond.  A gentle ace wrap was placed to the upper arm.  Taken to recovery in stable condition.  Complication: None  Condition: Stable  Marty Heck, MD [facility] Office: [place]

## 2022-05-30 NOTE — Anesthesia Postprocedure Evaluation (Signed)
Anesthesia Post Note  Patient: Hannah [name]  Procedure(s) Performed: RIGHT ARM ARTERIOVENOUS (AV) FISTULA REVISION WITH GORETEX GRAFT (Right: Arm Upper) INSERTION OF TUNNELED DIALYSIS CATHETER IN THE RIGHT INTERNAL JUGULAR (Right: Chest) ULTRASOUND GUIDANCE FOR VASCULAR ACCESS (Right: Neck)     Patient location during evaluation: PACU Anesthesia Type: Regional Level of consciousness: awake Pain management: pain level controlled Vital Signs Assessment: post-procedure vital signs reviewed and stable Respiratory status: spontaneous breathing, nonlabored ventilation and respiratory function stable Cardiovascular status: blood pressure returned to baseline and stable Postop Assessment: no apparent nausea or vomiting Anesthetic complications: no   No notable events documented.  Last Vitals:  Vitals:   05/28/22 1045 05/28/22 1100  BP: 117/64 (!) 123/57  Pulse: 69 66  Resp: 16 11  Temp:  (!) 36.1 C  SpO2: 91% 100%    Last Pain:  Vitals:   05/28/22 1100  TempSrc:   PainSc: 0-No pain                 Curby Carswell P Hedwig Mcfall

## 2022-05-31 ENCOUNTER — Encounter (HOSPITAL_COMMUNITY): Payer: Self-pay | Admitting: Vascular Surgery

## 2022-05-31 DIAGNOSIS — Z992 Dependence on renal dialysis: Secondary | ICD-10-CM | POA: Diagnosis not present

## 2022-05-31 DIAGNOSIS — N186 End stage renal disease: Secondary | ICD-10-CM | POA: Diagnosis not present

## 2022-05-31 DIAGNOSIS — T7840XA Allergy, unspecified, initial encounter: Secondary | ICD-10-CM | POA: Diagnosis not present

## 2022-05-31 DIAGNOSIS — N2581 Secondary hyperparathyroidism of renal origin: Secondary | ICD-10-CM | POA: Diagnosis not present

## 2022-05-31 DIAGNOSIS — D509 Iron deficiency anemia, unspecified: Secondary | ICD-10-CM | POA: Diagnosis not present

## 2022-05-31 DIAGNOSIS — D631 Anemia in chronic kidney disease: Secondary | ICD-10-CM | POA: Diagnosis not present

## 2022-05-31 DIAGNOSIS — E1122 Type 2 diabetes mellitus with diabetic chronic kidney disease: Secondary | ICD-10-CM | POA: Diagnosis not present

## 2022-05-31 DIAGNOSIS — D689 Coagulation defect, unspecified: Secondary | ICD-10-CM | POA: Diagnosis not present

## 2022-06-02 DIAGNOSIS — T7840XA Allergy, unspecified, initial encounter: Secondary | ICD-10-CM | POA: Diagnosis not present

## 2022-06-02 DIAGNOSIS — D631 Anemia in chronic kidney disease: Secondary | ICD-10-CM | POA: Diagnosis not present

## 2022-06-02 DIAGNOSIS — D689 Coagulation defect, unspecified: Secondary | ICD-10-CM | POA: Diagnosis not present

## 2022-06-02 DIAGNOSIS — N2581 Secondary hyperparathyroidism of renal origin: Secondary | ICD-10-CM | POA: Diagnosis not present

## 2022-06-02 DIAGNOSIS — D509 Iron deficiency anemia, unspecified: Secondary | ICD-10-CM | POA: Diagnosis not present

## 2022-06-02 DIAGNOSIS — N186 End stage renal disease: Secondary | ICD-10-CM | POA: Diagnosis not present

## 2022-06-02 DIAGNOSIS — E1122 Type 2 diabetes mellitus with diabetic chronic kidney disease: Secondary | ICD-10-CM | POA: Diagnosis not present

## 2022-06-02 DIAGNOSIS — Z992 Dependence on renal dialysis: Secondary | ICD-10-CM | POA: Diagnosis not present

## 2022-06-03 ENCOUNTER — Ambulatory Visit: Payer: Medicare Other | Admitting: Physical Therapy

## 2022-06-03 ENCOUNTER — Encounter: Payer: Medicare Other | Admitting: Speech Pathology

## 2022-06-03 ENCOUNTER — Encounter: Payer: Medicare Other | Admitting: Occupational Therapy

## 2022-06-04 DIAGNOSIS — N2581 Secondary hyperparathyroidism of renal origin: Secondary | ICD-10-CM | POA: Diagnosis not present

## 2022-06-04 DIAGNOSIS — D631 Anemia in chronic kidney disease: Secondary | ICD-10-CM | POA: Diagnosis not present

## 2022-06-04 DIAGNOSIS — Z992 Dependence on renal dialysis: Secondary | ICD-10-CM | POA: Diagnosis not present

## 2022-06-04 DIAGNOSIS — D689 Coagulation defect, unspecified: Secondary | ICD-10-CM | POA: Diagnosis not present

## 2022-06-04 DIAGNOSIS — N186 End stage renal disease: Secondary | ICD-10-CM | POA: Diagnosis not present

## 2022-06-04 DIAGNOSIS — E1122 Type 2 diabetes mellitus with diabetic chronic kidney disease: Secondary | ICD-10-CM | POA: Diagnosis not present

## 2022-06-04 DIAGNOSIS — T7840XA Allergy, unspecified, initial encounter: Secondary | ICD-10-CM | POA: Diagnosis not present

## 2022-06-04 DIAGNOSIS — D509 Iron deficiency anemia, unspecified: Secondary | ICD-10-CM | POA: Diagnosis not present

## 2022-06-07 ENCOUNTER — Encounter: Payer: Self-pay | Admitting: Cardiology

## 2022-06-07 DIAGNOSIS — Z992 Dependence on renal dialysis: Secondary | ICD-10-CM | POA: Diagnosis not present

## 2022-06-07 DIAGNOSIS — N186 End stage renal disease: Secondary | ICD-10-CM | POA: Diagnosis not present

## 2022-06-07 DIAGNOSIS — D631 Anemia in chronic kidney disease: Secondary | ICD-10-CM | POA: Diagnosis not present

## 2022-06-07 DIAGNOSIS — N2581 Secondary hyperparathyroidism of renal origin: Secondary | ICD-10-CM | POA: Diagnosis not present

## 2022-06-07 DIAGNOSIS — T7840XA Allergy, unspecified, initial encounter: Secondary | ICD-10-CM | POA: Diagnosis not present

## 2022-06-07 DIAGNOSIS — D689 Coagulation defect, unspecified: Secondary | ICD-10-CM | POA: Diagnosis not present

## 2022-06-07 DIAGNOSIS — E1122 Type 2 diabetes mellitus with diabetic chronic kidney disease: Secondary | ICD-10-CM | POA: Diagnosis not present

## 2022-06-07 DIAGNOSIS — D509 Iron deficiency anemia, unspecified: Secondary | ICD-10-CM | POA: Diagnosis not present

## 2022-06-07 NOTE — Progress Notes (Unsigned)
Primary Care Provider: Faustino Congress, NP [place] Cardiologist: Glenetta Hew, MD Electrophysiologist: None  Clinic Note: No chief complaint on file.   ===================================  ASSESSMENT/PLAN   Problem List Items Addressed This Visit       Cardiology Problems   CAD S/P PCI x 2 - Primary (Chronic)   Hyperlipidemia associated with type 2 diabetes mellitus ([place])   Essential hypertension (Chronic)     Other   ESRD on hemodialysis ([place]) (Chronic)   Multiple sclerosis ([place]) (Chronic)   H/O: CVA (cerebrovascular accident)    ===================================  HPI:    Hannah [name] is a 57 y.o. female who is being seen today for the evaluation of *** at the request of Faustino Congress, NP.  Sophiah Rolin was last seen on ***  Recent Hospitalizations: ***  Reviewed  CV studies:    The following studies were reviewed today: (if available, images/films reviewed: From Epic Chart or Care Everywhere)  12/25/2015 -  NM Myocardial Perfusion SPECT([place], [place]): Lexiscan Sestamibi: No Ischemia or Infarction.  Normal EF without RWMA. TTE : Moderate concentric LVH with sigmoid septum.  No LVOT obstruction.  Hyperdynamic LV function-EF 65 to 70%.  GR 1 DD with elevated LVEDP.  Sclerotic aortic valve with no stenosis.  07/11/2017 - TTE  [facility]): Normal LV Fxn - EF 60%.  Abnormal septal motion.  Mild LVH mild MAC.  Negative bubble study  Serial TTE's at [facility]:  06/11/2020 : EF 60 to 65%.  No RWMA.  GR 2 DD.  Small posterior pericardial effusion.  No AAS.  Trivial MR.  Normal RAP. 09/27/2020: EF 70 to 75%.  Hyperdynamic.  No RWMA.  Unable to assess diastolic pressures.  Mildly elevated PAP.  Pericardial effusion with no tamponade.  Mild aortic valve sclerosis but no stenosis.  Normal RAP. 01/13/2021: EF 65 to 70%.  No RWMA.  GR 1 DD.  Mild concentric LVH.  Elevated LAP.  Normal PAP.  Circumferential pericardial effusion w/o  tamponade.  Aortic valve sclerosis with no stenosis.  Normal RAP.   Interval History:   [name]   CV Review of Symptoms (Summary): {roscv:310661}  REVIEWED OF SYSTEMS   ROS  I have reviewed and (if needed) personally updated the patient's problem list, medications, allergies, past medical and surgical history, social and family history.   PAST MEDICAL HISTORY   Past Medical History:  Diagnosis Date   CAD S/P PCI x 2 2009   MI 2009 -> PCI x 2 ([place]); 01/2016 Nuc ST Nonischemic with Normal EF   Cerebral infarction ([place]) 07/12/2017   2009 first one, has had a total of 4 stokes- last 2021   Chronic kidney disease    Hemo M-W- F   Diabetes type 2, controlled ([place])    Hemiparesis affecting right side as late effect of cerebrovascular accident (CVA) ([place])    Hyperlipidemia    Hypertension    Intractable vomiting with nausea 07/12/2017   Multiple sclerosis ([place]) 2009   Myocardial infarction ([place]) 2009   Stroke [facility])    MEDICAL:  Past Medical History:  Diagnosis Date   Diabetes mellitus ([facility])   Hyperlipidemia ([facility])   Hypertension ([facility])   MS (multiple sclerosis) ([facility]) - ?? Vs. Simply CVA related     Stroke [facility]) - 2019  - CAD / MI - PCI x2 [place], [place] 2009); 01/2016 Nuc ST Nonischemic with Normal EF  SURGICAL:  Past Surgical History:  Procedure Laterality Date   APPENDECTOMY  CARDIAC CATHETERIZATION   CORONARY STENT PLACEMENT   Ovarian cyst removed   PAST SURGICAL HISTORY   Past Surgical History:  Procedure Laterality Date   A/V FISTULAGRAM Right 02/12/2021   Procedure: A/V FISTULAGRAM;  Surgeon: Marty Heck, MD;  Location: [facility];  Service: Cardiovascular;  Laterality: Right;   A/V FISTULAGRAM Right 05/20/2022   Procedure: A/V Fistulagram;  Surgeon: Marty Heck, MD;  Location: [facility];  Service: Cardiovascular;  Laterality: Right;   APPENDECTOMY     AV FISTULA PLACEMENT Right 10/02/2020    Procedure: RIGHT ARTERIOVENOUS (AV) FISTULA CREATION;  Surgeon: Waynetta Sandy, MD;  Location: [facility];  Service: Vascular;  Laterality: Right;   AV FISTULA PLACEMENT Right 05/28/2022   Procedure: RIGHT ARM ARTERIOVENOUS (AV) FISTULA REVISION WITH GORETEX GRAFT;  Surgeon: Marty Heck, MD;  Location: [facility];  Service: Vascular;  Laterality: Right;   [place] Right 11/24/2020   Procedure: SECOND STAGE RIGHT ARM [place];  Surgeon: Marty Heck, MD;  Location: [facility];  Service: Vascular;  Laterality: Right;   CYSTOSTOMY W/ BLADDER BIOPSY     INSERTION OF DIALYSIS CATHETER Right 05/28/2022   Procedure: INSERTION OF TUNNELED DIALYSIS CATHETER IN THE RIGHT INTERNAL JUGULAR;  Surgeon: Marty Heck, MD;  Location: [place] [facility];  Service: Vascular;  Laterality: Right;   IR FLUORO GUIDE CV LINE RIGHT  09/29/2020   IR US GUIDE VASC ACCESS RIGHT  09/29/2020   NM MYOVIEW LTD  01/2016   [place], [place] - NORMAL /NEGATIVE for ischemia. Normal LV Fxn   OVARIAN CYST SURGERY     PERCUTANEOUS CORONARY STENT INTERVENTION (PCI-S)  2009   [place], [place] - 2 stents in settng of MI -- unknown details   PERIPHERAL VASCULAR BALLOON ANGIOPLASTY Right 02/12/2021   Procedure: PERIPHERAL VASCULAR BALLOON ANGIOPLASTY;  Surgeon: Marty Heck, MD;  Location: [facility] [place];  Service: Cardiovascular;  Laterality: Right;  UPPER ARM FISTULA   PERIPHERAL VASCULAR BALLOON ANGIOPLASTY Right 01/28/2022   Procedure: PERIPHERAL VASCULAR BALLOON ANGIOPLASTY;  Surgeon: Marty Heck, MD;  Location: [place];  Service: Cardiovascular;  Laterality: Right;  Arm fistula   PERIPHERAL VASCULAR BALLOON ANGIOPLASTY  05/20/2022   Procedure: PERIPHERAL VASCULAR BALLOON ANGIOPLASTY;  Surgeon: Marty Heck, MD;  Location: [facility];  Service: Cardiovascular;;  rt arm fistula site   ULTRASOUND GUIDANCE FOR VASCULAR ACCESS Right 05/28/2022    Procedure: ULTRASOUND GUIDANCE FOR VASCULAR ACCESS;  Surgeon: Marty Heck, MD;  Location: [facility];  Service: Vascular;  Laterality: Right;     There is no immunization history on file for this patient.  MEDICATIONS/ALLERGIES   Current Meds  Medication Sig   acetaminophen (TYLENOL) 325 MG tablet Take 650 mg by mouth every 6 (six) hours as needed for moderate pain.   amLODipine (NORVASC) 10 MG tablet Take 1 tablet (10 mg total) by mouth daily.   calcium acetate (PHOSLO) 667 MG capsule Take 1,334 mg by mouth in the morning, at noon, and at bedtime.   carvedilol (COREG) 25 MG tablet Take 1 tablet (25 mg total) by mouth 2 (two) times daily with a meal.   clopidogrel (PLAVIX) 75 MG tablet Take 1 tablet (75 mg total) by mouth daily.   Continuous Blood Gluc Sensor (FREESTYLE LIBRE 2 SENSOR) MISC USE AS DIRECTED CHANGE EVERY 14 DAYS   diclofenac Sodium (VOLTAREN) 1 % GEL Apply 2 g topically 4 (four) times daily. To leg (  Patient taking differently: Apply 2 g topically 4 (four) times daily as needed (pain). To leg)   famotidine (PEPCID) 20 MG tablet Take 1 tablet (20 mg total) by mouth daily.   insulin degludec (TRESIBA FLEXTOUCH) 100 UNIT/ML FlexTouch Pen Inject 12 Units into the skin at bedtime. (Patient taking differently: Inject 8-12 Units into the skin at bedtime. Sliding scale)   insulin lispro (HUMALOG) 100 UNIT/ML KwikPen Inject 5 Units into the skin 3 (three) times daily.   lidocaine-prilocaine (EMLA) cream Apply 1 Application topically daily as needed (port access).   melatonin 5 MG TABS Take 5 mg by mouth at bedtime as needed (sleep).   metoCLOPramide (REGLAN) 5 MG tablet Take 5 mg by mouth 3 (three) times daily before meals.   nitroGLYCERIN (NITROSTAT) 0.4 MG SL tablet Place 1 tablet (0.4 mg total) under the tongue every 5 (five) minutes as needed for chest pain.   polyethylene glycol (MIRALAX / GLYCOLAX) 17 g packet Take 17 g by mouth daily. (Patient taking differently: Take 17 g by  mouth daily as needed for moderate constipation.)   rosuvastatin (CRESTOR) 20 MG tablet Take 20 mg by mouth daily.   senna-docusate (SENOKOT-S) 8.6-50 MG tablet Take 1 tablet by mouth 2 (two) times daily as needed for moderate constipation. (Patient taking differently: Take 1 tablet by mouth 2 (two) times daily.)   traMADol (ULTRAM) 50 MG tablet Take 1 tablet (50 mg total) by mouth every 6 (six) hours as needed.    Allergies  Allergen Reactions   Ibuprofen Swelling    Swelling to hands, feet and face    Influenza Vaccines Anaphylaxis and Swelling    fever   Pneumococcal Vaccine Anaphylaxis and Swelling    fever   Promethazine Other (See Comments)    hallucinations   Ambien [Zolpidem] Other (See Comments)    Unknown reaction   Codeine Itching   Cyclobenzaprine Other (See Comments)    hallucinations    Hydrocodone-Acetaminophen Itching   Tape Itching   Tramadol Hcl Other (See Comments)    hallucinations   Wound Dressing Adhesive Itching    Use paper tape   Oxycodone Nausea And Vomiting    SOCIAL HISTORY/FAMILY HISTORY   Reviewed in Epic:   Social History   Tobacco Use   Smoking status: Never   Smokeless tobacco: Never  Vaping Use   Vaping Use: Never used  Substance Use Topics   Alcohol use: Not Currently   Drug use: Never   Social History   Social History Narrative   Not on file   No family history on file.  OBJCTIVE -PE, EKG, labs   Wt Readings from Last 3 Encounters:  06/08/22 108 lb 0.4 oz (49 kg)  05/28/22 108 lb 0.4 oz (49 kg)  05/20/22 108 lb (49 kg)    Physical Exam: BP (!) 101/57   Pulse 82   Ht _0  (1.575 m)   Wt 108 lb 0.4 oz (49 kg)   SpO2 98%   BMI 19.76 kg/m  Physical Exam   Adult ECG Report  Rate: *** ;  Rhythm: {rhythm:17366};   Narrative Interpretation: ***  Recent Labs:  ***  Lab Results  Component Value Date   CHOL 133 04/01/2022   HDL 80 04/01/2022   LDLCALC 41 04/01/2022   TRIG 52 04/01/2022   CHOLHDL 1.7  04/01/2022   Lab Results  Component Value Date   CREATININE 4.90 (H) 05/28/2022   BUN 24 (H) 05/28/2022   NA 138 05/28/2022  K 4.1 05/28/2022   CL 98 05/28/2022   CO2 29 05/05/2022      Latest Ref Rng & Units 05/28/2022    6:13 AM 05/20/2022   12:17 PM 05/05/2022    3:31 PM  CBC  Hemoglobin 12.0 - 15.0 g/dL 10.2  10.5  10.9   Hematocrit 36.0 - 46.0 % 30.0  31.0  32.0     Lab Results  Component Value Date   HGBA1C 7.5 (H) 04/01/2022   Lab Results  Component Value Date   TSH 0.966 01/13/2021    ================================================== I spent a total of *** minutes with the patient spent in direct patient consultation.  Additional time spent with chart review  / charting (studies, outside notes, etc): *** min Total Time: *** min  Current medicines are reviewed at length with the patient today.  (+/- concerns) ***  Notice: This dictation was prepared with Dragon dictation along with smart phrase technology. Any transcriptional errors that result from this process are unintentional and may not be corrected upon review.   Studies Ordered:  No orders of the defined types were placed in this encounter.  No orders of the defined types were placed in this encounter.   Patient Instructions / Medication Changes & Studies & Tests Ordered   There are no Patient Instructions on file for this visit.    Leonie Man, MD, MS Glenetta Hew, M.D., M.S. Interventional Cardiologist  [place]  Pager # [phone] Phone # [phone] [address] [place], [place] [address]   Thank you for choosing [facility]!!

## 2022-06-08 ENCOUNTER — Ambulatory Visit: Payer: Medicare Other | Attending: Cardiology | Admitting: Cardiology

## 2022-06-08 ENCOUNTER — Encounter: Payer: Self-pay | Admitting: Cardiology

## 2022-06-08 VITALS — BP 101/57 | HR 82 | Ht 62.0 in | Wt 108.0 lb

## 2022-06-08 DIAGNOSIS — G35 Multiple sclerosis: Secondary | ICD-10-CM | POA: Diagnosis not present

## 2022-06-08 DIAGNOSIS — E1169 Type 2 diabetes mellitus with other specified complication: Secondary | ICD-10-CM

## 2022-06-08 DIAGNOSIS — E785 Hyperlipidemia, unspecified: Secondary | ICD-10-CM | POA: Diagnosis not present

## 2022-06-08 DIAGNOSIS — Z8673 Personal history of transient ischemic attack (TIA), and cerebral infarction without residual deficits: Secondary | ICD-10-CM | POA: Diagnosis not present

## 2022-06-08 DIAGNOSIS — G35D Multiple sclerosis, unspecified: Secondary | ICD-10-CM

## 2022-06-08 DIAGNOSIS — I252 Old myocardial infarction: Secondary | ICD-10-CM | POA: Diagnosis not present

## 2022-06-08 DIAGNOSIS — Z9861 Coronary angioplasty status: Secondary | ICD-10-CM

## 2022-06-08 DIAGNOSIS — N186 End stage renal disease: Secondary | ICD-10-CM | POA: Diagnosis not present

## 2022-06-08 DIAGNOSIS — I2510 Atherosclerotic heart disease of native coronary artery without angina pectoris: Secondary | ICD-10-CM | POA: Diagnosis not present

## 2022-06-08 DIAGNOSIS — I10 Essential (primary) hypertension: Secondary | ICD-10-CM

## 2022-06-08 DIAGNOSIS — Z992 Dependence on renal dialysis: Secondary | ICD-10-CM | POA: Diagnosis not present

## 2022-06-08 DIAGNOSIS — R609 Edema, unspecified: Secondary | ICD-10-CM

## 2022-06-08 MED ORDER — AMLODIPINE BESYLATE 10 MG PO TABS: 10.0000 mg | ORAL_TABLET | Freq: Every day | ORAL | 0 refills | Status: DC

## 2022-06-08 MED ORDER — CARVEDILOL 25 MG PO TABS: 25.0000 mg | ORAL_TABLET | Freq: Two times a day (BID) | ORAL | 0 refills | Status: DC

## 2022-06-08 NOTE — Assessment & Plan Note (Signed)
The patient thinks she now is having hypotension especially following HD.  She is on 10 mg amlodipine along with 25 mg twice daily carvedilol.  She does not take the carvedilol on her dialysis today but does take the amlodipine when she gets home.  She is concerned that her pressures are low at the end of dialysis and she is very dizzy.  Plan: On predialysis (pre-HD) days, tcarvedilol 25 mg 1 full tablet in the morning, and 1/2 tablet (12.5 mg) for p.m. dose; take amlodipine with morning /lunch dose On HD days, hold amlodipine, instead take 1/2 tablet carvedilol (12.5 mg) following dialysis if BP stable. On Saturdays, take 25 mg twice daily carvedilol plus to 10 mg about a pain.

## 2022-06-08 NOTE — Patient Instructions (Addendum)
Medication Instructions:  Changes  The night before dialysis take Carvedilol 12.5 mg ( 1/2 tablet of 25 mg )   Day of Dialysis  - when you return from Dialysis take  carvedilol 12.5 mg  (1/2 tablet of 25 mg )   and the night dose take 25 mg.  Do not take Amlodipine tablet on dialysis days   On Saturdays take 25 mg carvedilol twice a day     *If you need a refill on your cardiac medications before your next appointment, please call your pharmacy*   Lab Work: Not needed    Testing/Procedures:  Not needed  Follow-Up: At [facility], you and your health needs are our priority.  As part of our continuing mission to provide you with exceptional heart care, we have created designated Provider Care Teams.  These Care Teams include your primary Cardiologist (physician) and Advanced Practice Providers (APPs -  Physician Assistants and Nurse Practitioners) who all work together to provide you with the care you need, when you need it.     Your next appointment:   6 month(s)- please call in March for July appt.  The format for your next appointment:   In Person  Provider:   Glenetta Hew, MD    Other Instructions    Wear support socks/hose every day - take off at night Do the foot exercise discussed at appointment  Elevate your feet   recommends you purchase some compression  socks/hose from [employer] in [place] ,[place]. You do not need an prescription to purchase the items.  Address  [address] [place], [place] [address]  Phone  [phone]   Compression   strength    8-15 mmHg X15-20 mmHg                           20-30 mmHg  30-40 mmHg.  You may also try a medical supply store, department store (i.e.- [place], [employer], [employer], specialty shoe stores ( shoe market), [employer] and [employer]) or  Chartered loss adjuster uniform store.

## 2022-06-08 NOTE — Progress Notes (Incomplete)
Primary Care Provider: Faustino Congress, NP [place] Cardiologist: Glenetta Hew, MD Electrophysiologist: None  Clinic Note: No chief complaint on file.   ===================================  ASSESSMENT/PLAN   Problem List Items Addressed This Visit       Cardiology Problems   CAD S/P PCI x 2 - Primary (Chronic)   Relevant Medications   carvedilol (COREG) 25 MG tablet   amLODipine (NORVASC) 10 MG tablet   Other Relevant Orders   EKG 12-Lead   Hyperlipidemia associated with type 2 diabetes mellitus ([place])   Relevant Medications   carvedilol (COREG) 25 MG tablet   amLODipine (NORVASC) 10 MG tablet   Essential hypertension (Chronic)   Relevant Medications   carvedilol (COREG) 25 MG tablet   amLODipine (NORVASC) 10 MG tablet     Other   ESRD on hemodialysis (HCC) (Chronic)   Relevant Orders   EKG 12-Lead   Multiple sclerosis (HCC) (Chronic)   H/O: CVA (cerebrovascular accident)    ===================================  HPI:    Hannah [name] is a 57 y.o. female who is being seen today to establish cardiology care.  She was referred at the request of Faustino Congress, NP.  Hannah [name] was last seen by Dr. Alcide Goodness [facility], [place]) on July 06, 2016 -> rare atypical chest pain symptoms confounded by GERD.  Not similar to angina.  Had had a stress test to evaluate symptoms in September 2017.  Most likely neurologic symptoms were from stroke with right-sided weakness and facial droop.  BP controlled.  Lipids are relatively reasonable. She was on Lopressor 100 mg twice daily, Lotensin 40 mg daily, HCTZ along with rosuvastatin (Crestor) 10 mg daily.  She is also on aspirin Plavix with no major bleeding. => Crestor dose increased to 20 mg  Recent Hospitalizations: ***  Reviewed  CV studies:    The following studies were reviewed today: (if available, images/films reviewed: From Epic Chart or Care Everywhere)  12/25/2015 [facility], [place] Myocardial  Perfusion SPECT: Lexiscan Sestamibi: No Ischemia or Infarction.  Normal EF without RWMA. TTE : Moderate concentric LVH with sigmoid septum.  No LVOT obstruction.  Hyperdynamic LV function-EF 65 to 70%.  GR 1 DD with elevated LVEDP.  Sclerotic aortic valve with no stenosis.  MRI head August 2017: 1. Mild/moderate motion artifact. 2. Right pons focal signal abnormality with restricted diffusion suggestive of subacute infarct. Less likely differential considerations include an active demyelinating plaque in this patient with a given history of multiple sclerosis. 3. Moderate patchy T2 signal abnormality without enhancement in the bilateral periventricular white matter consistent the patient's history of multiple sclerosis. 4. Left lentiform nucleus/internal capsule old infarct, with left cerebral peduncle Wallerian degeneration. 5. Left pons and left cerebellum old lacunar infarcts. 6. Right putamen small focus of old hemorrhage, possibly due to a cavernous hemangioma. 7. Negative intracranial MRA. 8. Extracranial MRA demonstrates mild narrowing at the ICA origins bilaterally without significant stenosis. Otherwise, negative extracranial MRA.   07/11/2017 - TTE  [facility]): Normal LV Fxn - EF 60%.  Abnormal septal motion.  Mild LVH mild MAC.  Negative bubble study  Serial TTE's at [facility]:  06/11/2020 : EF 60 to 65%.  No RWMA.  GR 2 DD.  Small posterior pericardial effusion.  No AAS.  Trivial MR.  Normal RAP. 09/27/2020: EF 70 to 75%.  Hyperdynamic.  No RWMA.  Unable to assess diastolic pressures.  Mildly elevated PAP.  Pericardial effusion with no tamponade.  Mild aortic valve sclerosis but no stenosis.  Normal RAP. 01/13/2021: EF 65 to 70%.  No RWMA.  GR 1 DD.  Mild concentric LVH.  Elevated LAP.  Normal PAP.  Circumferential pericardial effusion w/o tamponade.  Aortic valve sclerosis with no stenosis.  Normal RAP.   Interval History:   Hannah [name] presents here today  along with her daughter who is her main caregiver.  Apparently she moved to [place] from [place] back in 2018.  She just has not followed up with cardiology until now.  She is now ESRD having started dialysis in May 2022.  This been issues with her fistula.  She subsequently had a converted to a graft and now is being dialyzed through dialysis catheter until the graft matures. She says that her blood pressures have definitely been low since starting dialysis in fact sometimes are very low she completes dialysis and she gets somewhat dizzy and lightheaded at the end of it.  She has residual right-sided weakness both upper and lower extremity as well as facial droop.  She is here in a wheelchair today but can get around with a walker as long as she has some assistance.  They were trying to arrange for her to get some rehab but she was unable to get to the appointments because of transportation issues and they are currently working on that.  She pretty much has been stable from a cardiac standpoint with no active chest pain or pressure with rest or exertion.  She rarely has chest pain during dialysis only if there getting her blood pressure is too low.  She may get a little bit dyspneic with lying down on Sunday nights before dialysis, but otherwise is not having any PND orthopnea.  No real notable edema.  No rapid irregular heartbeats palpitations.  No recurrent CVA symptoms.  No TIA or emesis fugax.  No claudication because she does not walk enough to notice it.  No syncope or near syncope.  She also denies any significant bleeding or bruising.  No melena, hematochezia or hematuria.  No epistaxis.  REVIEWED OF SYSTEMS   Review of Systems  Constitutional:  Positive for malaise/fatigue (She feels worn out and exhausted after completing dialysis.). Negative for weight loss.  HENT:  Negative for congestion.   Respiratory:  Negative for cough and sputum production.   Cardiovascular:  Negative for  claudication.  Gastrointestinal:  Negative for blood in stool and melena.  Genitourinary:  Negative for hematuria.  Musculoskeletal:  Positive for joint pain. Negative for falls and myalgias.  Neurological:  Positive for dizziness (Most notably at the end of dialysis.  Usually when she finishes dialysis she is quite dizzy but she does not go home until her blood pressures are more stable.) and weakness (Right arm and right leg.).  Psychiatric/Behavioral:  Positive for memory loss (A little confusion). Negative for depression. The patient is not nervous/anxious and does not have insomnia.   All other systems reviewed and are negative.   I have reviewed and (if needed) personally updated the patient's problem list, medications, allergies, past medical and surgical history, social and family history.   PAST MEDICAL HISTORY   Past Medical History:  Diagnosis Date  . CAD S/P PCI x 2 2009   MI 2009 -> PCI x 2 ([place]); 01/2016 Nuc ST Nonischemic with Normal EF  . Cerebral infarction ([place]) 07/12/2017   2009 first one, has had a total of 4 stokes- last 2021  . Chronic kidney disease    Hemo M-W- F  . Diabetes  type 2, controlled ([place])   . Hemiparesis affecting right side as late effect of cerebrovascular accident (CVA) ([place])   . Hyperlipidemia   . Hypertension   . Intractable vomiting with nausea 07/12/2017  . Multiple sclerosis ([place]) 2009  . Myocardial infarction ([place]) 2009  . Stroke [facility])    MEDICAL:  Past Medical History:  Diagnosis Date  . Diabetes mellitus ([facility])  . Hyperlipidemia ([facility])  . Hypertension ([facility])  . MS (multiple sclerosis) ([facility]) - ?? Vs. Simply CVA related    . Stroke [facility]) - 2019  - CAD / MI - PCI x2 [facility], [place] 2009); 01/2016 Nuc ST Nonischemic with Normal EF  SURGICAL:  Past Surgical History:  Procedure Laterality Date  . APPENDECTOMY  . CARDIAC CATHETERIZATION  . CORONARY STENT PLACEMENT  . Ovarian cyst removed   PAST SURGICAL  HISTORY   Past Surgical History:  Procedure Laterality Date  . A/V FISTULAGRAM Right 02/12/2021   Procedure: A/V FISTULAGRAM;  Surgeon: Marty Heck, MD;  Location: [place] [facility];  Service: Cardiovascular;  Laterality: Right;  . A/V FISTULAGRAM Right 05/20/2022   Procedure: A/V Fistulagram;  Surgeon: Marty Heck, MD;  Location: [place] [facility];  Service: Cardiovascular;  Laterality: Right;  . APPENDECTOMY    . AV FISTULA PLACEMENT Right 10/02/2020   Procedure: RIGHT ARTERIOVENOUS (AV) FISTULA CREATION;  Surgeon: Waynetta Sandy, MD;  Location: [place];  Service: Vascular;  Laterality: Right;  . AV FISTULA PLACEMENT Right 05/28/2022   Procedure: RIGHT ARM ARTERIOVENOUS (AV) FISTULA REVISION WITH GORETEX GRAFT;  Surgeon: Marty Heck, MD;  Location: [place];  Service: Vascular;  Laterality: Right;  . BASCILIC VEIN TRANSPOSITION Right 11/24/2020   Procedure: SECOND STAGE RIGHT ARM BASCILIC VEIN TRANSPOSITION;  Surgeon: Marty Heck, MD;  Location: [place];  Service: Vascular;  Laterality: Right;  . CYSTOSTOMY W/ BLADDER BIOPSY    . INSERTION OF DIALYSIS CATHETER Right 05/28/2022   Procedure: INSERTION OF TUNNELED DIALYSIS CATHETER IN THE RIGHT INTERNAL JUGULAR;  Surgeon: Marty Heck, MD;  Location: [place];  Service: Vascular;  Laterality: Right;  . IR FLUORO GUIDE CV LINE RIGHT  09/29/2020  . IR US GUIDE VASC ACCESS RIGHT  09/29/2020  . NM MYOVIEW LTD  12/25/2015   [place], [place] - NORMAL /NEGATIVE for ischemia. Normal LV Fxn  . OVARIAN CYST SURGERY    . PERCUTANEOUS CORONARY STENT INTERVENTION (PCI-S)  2009   [place], [place] - 2 stents in settng of MI -- unknown details  . PERIPHERAL VASCULAR BALLOON ANGIOPLASTY Right 02/12/2021   Procedure: PERIPHERAL VASCULAR BALLOON ANGIOPLASTY;  Surgeon: Marty Heck, MD;  Location: [place] [facility];  Service: Cardiovascular;  Laterality: Right;  UPPER ARM FISTULA  . PERIPHERAL VASCULAR BALLOON  ANGIOPLASTY Right 01/28/2022   Procedure: PERIPHERAL VASCULAR BALLOON ANGIOPLASTY;  Surgeon: Marty Heck, MD;  Location: [place] [facility];  Service: Cardiovascular;  Laterality: Right;  Arm fistula  . PERIPHERAL VASCULAR BALLOON ANGIOPLASTY  05/20/2022   Procedure: PERIPHERAL VASCULAR BALLOON ANGIOPLASTY;  Surgeon: Marty Heck, MD;  Location: [place] [facility];  Service: Cardiovascular;;  rt arm fistula site  . TRANSTHORACIC ECHOCARDIOGRAM  07/11/2017   a) 12/2015, [facility], [place]): Moderate Conc LVH w/ sigmoid septum.  No LVOT obstruction.  Hyperdynamic LV fxn - EF 65 to 70%.  GR 1 DD & high LAP/LVEDP.  AoV Sclerosis - No AS;; b) 06/2017: [facility]): Normal LV Fxn - EF 60%.  Abnormal septal motion.  Mild LVH mild MAC.  Negative bubble study (for CVA)  . TRANSTHORACIC ECHOCARDIOGRAM  12/2020   a) 05/2020: EF 60-65%. No RWMA. Gr2DD. Sm posterior pericardial effusion. No AS.; b) 09/2020: EF 70-75%.  Hyperdynamic.  No RWMA. ? D Fxn/LAP.  Mildly elevated PAP.  Pericardial effusion.  Mild ao V sclerosis cyst.; c) 12/2020 (Limited): EF 65-70%. No RWMA. Gr1-2 DD. Mild Conc LVH- high LAP. Nl PAP. Circumferential effusion w/o tamponade. AoV Sclerosis - no AS. Nl RAP.  Marland Kitchen ULTRASOUND GUIDANCE FOR VASCULAR ACCESS Right 05/28/2022   Procedure: ULTRASOUND GUIDANCE FOR VASCULAR ACCESS;  Surgeon: Marty Heck, MD;  Location: [place];  Service: Vascular;  Laterality: Right;     There is no immunization history on file for this patient.  MEDICATIONS/ALLERGIES   Current Meds  Medication Sig  . acetaminophen (TYLENOL) 325 MG tablet Take 650 mg by mouth every 6 (six) hours as needed for moderate pain.  Marland Kitchen amLODipine (NORVASC) 10 MG tablet Take 1 tablet (10 mg total) by mouth daily.  . calcium acetate (PHOSLO) 667 MG capsule Take 1,334 mg by mouth in the morning, at noon, and at bedtime.  . carvedilol (COREG) 25 MG tablet Take 1 tablet (25 mg total) by mouth 2 (two) times  daily with a meal.  . clopidogrel (PLAVIX) 75 MG tablet Take 1 tablet (75 mg total) by mouth daily.  . Continuous Blood Gluc Sensor (FREESTYLE LIBRE 2 SENSOR) MISC USE AS DIRECTED CHANGE EVERY 14 DAYS  . diclofenac Sodium (VOLTAREN) 1 % GEL Apply 2 g topically 4 (four) times daily. To leg (Patient taking differently: Apply 2 g topically 4 (four) times daily as needed (pain). To leg)  . famotidine (PEPCID) 20 MG tablet Take 1 tablet (20 mg total) by mouth daily.  . insulin degludec (TRESIBA FLEXTOUCH) 100 UNIT/ML FlexTouch Pen Inject 12 Units into the skin at bedtime. (Patient taking differently: Inject 8-12 Units into the skin at bedtime. Sliding scale)  . insulin lispro (HUMALOG) 100 UNIT/ML KwikPen Inject 5 Units into the skin 3 (three) times daily.  Marland Kitchen lidocaine-prilocaine (EMLA) cream Apply 1 Application topically daily as needed (port access).  . melatonin 5 MG TABS Take 5 mg by mouth at bedtime as needed (sleep).  . metoCLOPramide (REGLAN) 5 MG tablet Take 5 mg by mouth 3 (three) times daily before meals.  . nitroGLYCERIN (NITROSTAT) 0.4 MG SL tablet Place 1 tablet (0.4 mg total) under the tongue every 5 (five) minutes as needed for chest pain.  . polyethylene glycol (MIRALAX / GLYCOLAX) 17 g packet Take 17 g by mouth daily. (Patient taking differently: Take 17 g by mouth daily as needed for moderate constipation.)  . rosuvastatin (CRESTOR) 20 MG tablet Take 20 mg by mouth daily.  Marland Kitchen senna-docusate (SENOKOT-S) 8.6-50 MG tablet Take 1 tablet by mouth 2 (two) times daily as needed for moderate constipation. (Patient taking differently: Take 1 tablet by mouth 2 (two) times daily.)  . traMADol (ULTRAM) 50 MG tablet Take 1 tablet (50 mg total) by mouth every 6 (six) hours as needed.    Allergies  Allergen Reactions  . Ibuprofen Swelling    Swelling to hands, feet and face   . Influenza Vaccines Anaphylaxis and Swelling    fever  . Pneumococcal Vaccine Anaphylaxis and Swelling    fever  .  Promethazine Other (See Comments)    hallucinations  . Ambien [Zolpidem] Other (See Comments)    Unknown reaction  . Codeine Itching  . Cyclobenzaprine Other (See Comments)  hallucinations   . Hydrocodone-Acetaminophen Itching  . Tape Itching  . Tramadol Hcl Other (See Comments)    hallucinations  . Wound Dressing Adhesive Itching    Use paper tape  . Oxycodone Nausea And Vomiting    SOCIAL HISTORY/FAMILY HISTORY   Reviewed in Epic:   Social History   Tobacco Use  . Smoking status: Never  . Smokeless tobacco: Never  Vaping Use  . Vaping Use: Never used  Substance Use Topics  . Alcohol use: Not Currently  . Drug use: Never   Social History   Social History Narrative  . Not on file   No family history on file.  OBJCTIVE -PE, EKG, labs   Wt Readings from Last 3 Encounters:  06/08/22 108 lb 0.4 oz (49 kg)  05/28/22 108 lb 0.4 oz (49 kg)  05/20/22 108 lb (49 kg)    Physical Exam: BP (!) 101/57   Pulse 82   Ht _0  (1.575 m)   Wt 108 lb 0.4 oz (49 kg)   SpO2 98%   BMI 19.76 kg/m  Physical Exam Constitutional:      Appearance: She is normal weight. She is not toxic-appearing.     Comments: She is in a wheelchair, mild chronic ill appearance.  Well-groomed.  HENT:     Head: Atraumatic.     Comments: Right-sided facial droop Cardiovascular:     Rate and Rhythm: Normal rate and regular rhythm.     Pulses: Normal pulses.     Heart sounds: Murmur (Soft 1/6 SEM RUSB-neck) heard.  Pulmonary:     Effort: Pulmonary effort is normal. No respiratory distress.     Breath sounds: Normal breath sounds. No wheezing, rhonchi or rales.  Chest:     Chest wall: Tenderness (Just tender at the dialysis catheter access site) present.  Abdominal:     General: Abdomen is flat. Bowel sounds are normal. There is no distension.     Palpations: There is no mass.     Tenderness: There is no abdominal tenderness. There is no guarding or rebound.  Musculoskeletal:         General: No swelling or deformity.     Comments: Dialysis catheter from right IJ  Skin:    General: Skin is warm and dry.  Neurological:     Mental Status: She is alert and oriented to person, place, and time. Mental status is at baseline.     Cranial Nerves: Cranial nerve deficit (Right-sided facial droop) present.     Motor: Weakness (Right arm and leg are both weak.-From CVA) present.     Gait: Gait abnormal (She is in a wheelchair).  Psychiatric:        Mood and Affect: Mood normal.        Behavior: Behavior normal.        Thought Content: Thought content normal.        Judgment: Judgment normal.     Comments: Somewhat shy with flat affect.      Adult ECG Report  Rate: 82;  Rhythm: normal sinus rhythm and fusion beats.  IVCD with nonspecific ST and T wave changes. ;   Narrative Interpretation: Stable  Recent Labs: Reviewed Lab Results  Component Value Date   CHOL 133 04/01/2022   HDL 80 04/01/2022   LDLCALC 41 04/01/2022   TRIG 52 04/01/2022   CHOLHDL 1.7 04/01/2022   Lab Results  Component Value Date   CREATININE 4.90 (H) 05/28/2022   BUN 24 (H) 05/28/2022  NA 138 05/28/2022   K 4.1 05/28/2022   CL 98 05/28/2022   CO2 29 05/05/2022      Latest Ref Rng & Units 05/28/2022    6:13 AM 05/20/2022   12:17 PM 05/05/2022    3:31 PM  CBC  Hemoglobin 12.0 - 15.0 g/dL 10.2  10.5  10.9   Hematocrit 36.0 - 46.0 % 30.0  31.0  32.0     Lab Results  Component Value Date   HGBA1C 7.5 (H) 04/01/2022   Lab Results  Component Value Date   TSH 0.966 01/13/2021    ================================================== I spent a total of 32 minutes with the patient spent in direct patient consultation.  Additional time spent with chart review  / charting (studies, outside notes, etc): 30 min Total Time: 62 min  Current medicines are reviewed at length with the patient today.  (+/- concerns) none  Notice: This dictation was prepared with Dragon dictation along with smart  phrase technology. Any transcriptional errors that result from this process are unintentional and may not be corrected upon review.   Studies Ordered:  Orders Placed This Encounter  Procedures  . EKG 12-Lead   Meds ordered this encounter  Medications  . carvedilol (COREG) 25 MG tablet    Sig: Take 1 tablet (25 mg total) by mouth 2 (two) times daily with a meal. And as directed.    Dispense:  60 tablet    Refill:  0  . amLODipine (NORVASC) 10 MG tablet    Sig: Take 1 tablet (10 mg total) by mouth daily. Except on dialysis days do not take a dose.    Dispense:  30 tablet    Refill:  0    Patient Instructions / Medication Changes & Studies & Tests Ordered   Patient Instructions  Medication Instructions:  Changes  The night before dialysis take Carvedilol 12.5 mg ( 1/2 tablet of 25 mg )   Day of Dialysis  - when you return from Dialysis take  carvedilol 12.5 mg  (1/2 tablet of 25 mg )   and the night dose take 25 mg.  Do not take Amlodipine tablet on dialysis days   On Saturdays take 25 mg carvedilol twice a day     *If you need a refill on your cardiac medications before your next appointment, please call your pharmacy*   Lab Work: Not needed    Testing/Procedures:  Not needed  Follow-Up: At [facility], you and your health needs are our priority.  As part of our continuing mission to provide you with exceptional heart care, we have created designated Provider Care Teams.  These Care Teams include your primary Cardiologist (physician) and Advanced Practice Providers (APPs -  Physician Assistants and Nurse Practitioners) who all work together to provide you with the care you need, when you need it.     Your next appointment:   6 month(s)- please call in March for July appt.  The format for your next appointment:   In Person  Provider:   Glenetta Hew, MD    Other Instructions    Wear support socks/hose every day - take off at night Do the foot exercise  discussed at appointment  Elevate your feet   recommends you purchase some compression  socks/hose from [employer] in [place] ,[place]. You do not need an prescription to purchase the items.  Address  [address] [place], [place] [address]  Phone  [phone]  Compression   strength    8-15 mmHg X15-20 mmHg                           20-30 mmHg  30-40 mmHg.  You may also try a medical supply store, department store (i.e.- [employer], [employer], [employer], specialty shoe stores ( shoe market), [employer] and [employer]) or  Chartered loss adjuster uniform store.     Leonie Man, MD, MS Glenetta Hew, M.D., M.S. Interventional Cardiologist  [facility]  Pager # [phone] Phone # [phone] [address] [place], [place] [address]   Thank you for choosing [facility]!!

## 2022-06-09 DIAGNOSIS — D631 Anemia in chronic kidney disease: Secondary | ICD-10-CM | POA: Diagnosis not present

## 2022-06-09 DIAGNOSIS — T7840XA Allergy, unspecified, initial encounter: Secondary | ICD-10-CM | POA: Diagnosis not present

## 2022-06-09 DIAGNOSIS — D509 Iron deficiency anemia, unspecified: Secondary | ICD-10-CM | POA: Diagnosis not present

## 2022-06-09 DIAGNOSIS — E1122 Type 2 diabetes mellitus with diabetic chronic kidney disease: Secondary | ICD-10-CM | POA: Diagnosis not present

## 2022-06-09 DIAGNOSIS — N186 End stage renal disease: Secondary | ICD-10-CM | POA: Diagnosis not present

## 2022-06-09 DIAGNOSIS — D689 Coagulation defect, unspecified: Secondary | ICD-10-CM | POA: Diagnosis not present

## 2022-06-09 DIAGNOSIS — Z992 Dependence on renal dialysis: Secondary | ICD-10-CM | POA: Diagnosis not present

## 2022-06-09 DIAGNOSIS — R609 Edema, unspecified: Secondary | ICD-10-CM | POA: Insufficient documentation

## 2022-06-09 DIAGNOSIS — N2581 Secondary hyperparathyroidism of renal origin: Secondary | ICD-10-CM | POA: Diagnosis not present

## 2022-06-09 NOTE — Assessment & Plan Note (Signed)
According to the daughter, they have seen a neurologist he was not fully sure that the patient ever actually had multiple sclerosis.  However reading the brain MRI report, there is multiple suggestions of findings consistent with MS.  Defer to neurology.

## 2022-06-09 NOTE — Assessment & Plan Note (Signed)
Most recent lipid panel showed LDL 41.  Well-controlled on 20 mg rosuvastatin.  Continue current dose.  Unfortunately in the last A1c was 7.5 indicated she probably needs more adequate control.  Question benefit of SGLT2 inhibitors in a dialysis patient. Will defer management of glycemic control to PCP but probably requires adjustment of her Humalog dosing.  She is also on [place].

## 2022-06-09 NOTE — Assessment & Plan Note (Signed)
She seems to be tolerating dialysis okay but has had low blood pressures.  We are adjusting her medications as noted to hopefully help with that.  She is currently be dialyzed through a catheter/cannula but is waiting for maturation of her graft since the initial fistula did not take.

## 2022-06-09 NOTE — Assessment & Plan Note (Signed)
Clearly has history of MI by report, however her echocardiogram does not show any wall motion normalities and Myoview did not show any evidence of ischemia or infarction.  Therefore 1 would suggest that there was a minimal infarct. She is on stable regimen with no active anginal symptoms but did have some volume overload related CHF symptoms in the setting of renal failure.  Volume now controlled with dialysis.

## 2022-06-09 NOTE — Assessment & Plan Note (Signed)
She has chronic dependent edema which seems to be pretty well-controlled with dialysis, but when she sits all day long she will get some swelling.  Recommendation is for elevation and foot exercises as well as support stockings especially with travel after prolonged sitting.

## 2022-06-09 NOTE — Assessment & Plan Note (Addendum)
Remains asymptomatic since her PCI 2009.  Most recent stress test in 2017 was nonischemic and he has not really had any further symptoms.  Will try to get outside records.  Plan: She is well outside the window need to be on DAPT.  Even though she has had a stroke.  I think were okay stopping aspirin and simply continuing monotherapy with clopidogrel. Okay to hold Plavix 5 to 7 days preop for surgeries or procedures. Lipids look pretty stable we will continue current dose of rosuvastatin and adjust carvedilol/amlodipine dosing as noted in hypertension segment.

## 2022-06-09 NOTE — Assessment & Plan Note (Signed)
Right-sided facial droop and right-sided weakness.  Please apply fine just being on Plavix monotherapy along with statin and blood pressure control.

## 2022-06-10 ENCOUNTER — Ambulatory Visit: Payer: Medicare Other | Admitting: Physical Therapy

## 2022-06-10 ENCOUNTER — Encounter: Payer: Medicare Other | Admitting: Occupational Therapy

## 2022-06-10 ENCOUNTER — Encounter: Payer: Medicare Other | Admitting: Speech Pathology

## 2022-06-11 DIAGNOSIS — D631 Anemia in chronic kidney disease: Secondary | ICD-10-CM | POA: Diagnosis not present

## 2022-06-11 DIAGNOSIS — T7840XA Allergy, unspecified, initial encounter: Secondary | ICD-10-CM | POA: Diagnosis not present

## 2022-06-11 DIAGNOSIS — N186 End stage renal disease: Secondary | ICD-10-CM | POA: Diagnosis not present

## 2022-06-11 DIAGNOSIS — E1122 Type 2 diabetes mellitus with diabetic chronic kidney disease: Secondary | ICD-10-CM | POA: Diagnosis not present

## 2022-06-11 DIAGNOSIS — N2581 Secondary hyperparathyroidism of renal origin: Secondary | ICD-10-CM | POA: Diagnosis not present

## 2022-06-11 DIAGNOSIS — D689 Coagulation defect, unspecified: Secondary | ICD-10-CM | POA: Diagnosis not present

## 2022-06-11 DIAGNOSIS — Z992 Dependence on renal dialysis: Secondary | ICD-10-CM | POA: Diagnosis not present

## 2022-06-11 DIAGNOSIS — D509 Iron deficiency anemia, unspecified: Secondary | ICD-10-CM | POA: Diagnosis not present

## 2022-06-14 DIAGNOSIS — N2581 Secondary hyperparathyroidism of renal origin: Secondary | ICD-10-CM | POA: Diagnosis not present

## 2022-06-14 DIAGNOSIS — E1122 Type 2 diabetes mellitus with diabetic chronic kidney disease: Secondary | ICD-10-CM | POA: Diagnosis not present

## 2022-06-14 DIAGNOSIS — N186 End stage renal disease: Secondary | ICD-10-CM | POA: Diagnosis not present

## 2022-06-14 DIAGNOSIS — T7840XA Allergy, unspecified, initial encounter: Secondary | ICD-10-CM | POA: Diagnosis not present

## 2022-06-14 DIAGNOSIS — D631 Anemia in chronic kidney disease: Secondary | ICD-10-CM | POA: Diagnosis not present

## 2022-06-14 DIAGNOSIS — D509 Iron deficiency anemia, unspecified: Secondary | ICD-10-CM | POA: Diagnosis not present

## 2022-06-14 DIAGNOSIS — Z992 Dependence on renal dialysis: Secondary | ICD-10-CM | POA: Diagnosis not present

## 2022-06-14 DIAGNOSIS — D689 Coagulation defect, unspecified: Secondary | ICD-10-CM | POA: Diagnosis not present

## 2022-06-16 DIAGNOSIS — D689 Coagulation defect, unspecified: Secondary | ICD-10-CM | POA: Diagnosis not present

## 2022-06-16 DIAGNOSIS — T7840XA Allergy, unspecified, initial encounter: Secondary | ICD-10-CM | POA: Diagnosis not present

## 2022-06-16 DIAGNOSIS — E1022 Type 1 diabetes mellitus with diabetic chronic kidney disease: Secondary | ICD-10-CM | POA: Diagnosis not present

## 2022-06-16 DIAGNOSIS — N186 End stage renal disease: Secondary | ICD-10-CM | POA: Diagnosis not present

## 2022-06-16 DIAGNOSIS — Z992 Dependence on renal dialysis: Secondary | ICD-10-CM | POA: Diagnosis not present

## 2022-06-16 DIAGNOSIS — D509 Iron deficiency anemia, unspecified: Secondary | ICD-10-CM | POA: Diagnosis not present

## 2022-06-16 DIAGNOSIS — E1122 Type 2 diabetes mellitus with diabetic chronic kidney disease: Secondary | ICD-10-CM | POA: Diagnosis not present

## 2022-06-16 DIAGNOSIS — D631 Anemia in chronic kidney disease: Secondary | ICD-10-CM | POA: Diagnosis not present

## 2022-06-16 DIAGNOSIS — N2581 Secondary hyperparathyroidism of renal origin: Secondary | ICD-10-CM | POA: Diagnosis not present

## 2022-06-18 DIAGNOSIS — Z992 Dependence on renal dialysis: Secondary | ICD-10-CM | POA: Diagnosis not present

## 2022-06-18 DIAGNOSIS — D689 Coagulation defect, unspecified: Secondary | ICD-10-CM | POA: Diagnosis not present

## 2022-06-18 DIAGNOSIS — E1122 Type 2 diabetes mellitus with diabetic chronic kidney disease: Secondary | ICD-10-CM | POA: Diagnosis not present

## 2022-06-18 DIAGNOSIS — N186 End stage renal disease: Secondary | ICD-10-CM | POA: Diagnosis not present

## 2022-06-18 DIAGNOSIS — D509 Iron deficiency anemia, unspecified: Secondary | ICD-10-CM | POA: Diagnosis not present

## 2022-06-18 DIAGNOSIS — R519 Headache, unspecified: Secondary | ICD-10-CM | POA: Diagnosis not present

## 2022-06-18 DIAGNOSIS — N2581 Secondary hyperparathyroidism of renal origin: Secondary | ICD-10-CM | POA: Diagnosis not present

## 2022-06-18 DIAGNOSIS — D631 Anemia in chronic kidney disease: Secondary | ICD-10-CM | POA: Diagnosis not present

## 2022-06-21 DIAGNOSIS — D689 Coagulation defect, unspecified: Secondary | ICD-10-CM | POA: Diagnosis not present

## 2022-06-21 DIAGNOSIS — N2581 Secondary hyperparathyroidism of renal origin: Secondary | ICD-10-CM | POA: Diagnosis not present

## 2022-06-21 DIAGNOSIS — R519 Headache, unspecified: Secondary | ICD-10-CM | POA: Diagnosis not present

## 2022-06-21 DIAGNOSIS — D509 Iron deficiency anemia, unspecified: Secondary | ICD-10-CM | POA: Diagnosis not present

## 2022-06-21 DIAGNOSIS — Z992 Dependence on renal dialysis: Secondary | ICD-10-CM | POA: Diagnosis not present

## 2022-06-21 DIAGNOSIS — E1122 Type 2 diabetes mellitus with diabetic chronic kidney disease: Secondary | ICD-10-CM | POA: Diagnosis not present

## 2022-06-21 DIAGNOSIS — D631 Anemia in chronic kidney disease: Secondary | ICD-10-CM | POA: Diagnosis not present

## 2022-06-21 DIAGNOSIS — N186 End stage renal disease: Secondary | ICD-10-CM | POA: Diagnosis not present

## 2022-06-23 DIAGNOSIS — N2581 Secondary hyperparathyroidism of renal origin: Secondary | ICD-10-CM | POA: Diagnosis not present

## 2022-06-23 DIAGNOSIS — D631 Anemia in chronic kidney disease: Secondary | ICD-10-CM | POA: Diagnosis not present

## 2022-06-23 DIAGNOSIS — N186 End stage renal disease: Secondary | ICD-10-CM | POA: Diagnosis not present

## 2022-06-23 DIAGNOSIS — R519 Headache, unspecified: Secondary | ICD-10-CM | POA: Diagnosis not present

## 2022-06-23 DIAGNOSIS — D689 Coagulation defect, unspecified: Secondary | ICD-10-CM | POA: Diagnosis not present

## 2022-06-23 DIAGNOSIS — D509 Iron deficiency anemia, unspecified: Secondary | ICD-10-CM | POA: Diagnosis not present

## 2022-06-23 DIAGNOSIS — E1122 Type 2 diabetes mellitus with diabetic chronic kidney disease: Secondary | ICD-10-CM | POA: Diagnosis not present

## 2022-06-23 DIAGNOSIS — Z992 Dependence on renal dialysis: Secondary | ICD-10-CM | POA: Diagnosis not present

## 2022-06-25 DIAGNOSIS — N186 End stage renal disease: Secondary | ICD-10-CM | POA: Diagnosis not present

## 2022-06-25 DIAGNOSIS — D509 Iron deficiency anemia, unspecified: Secondary | ICD-10-CM | POA: Diagnosis not present

## 2022-06-25 DIAGNOSIS — R519 Headache, unspecified: Secondary | ICD-10-CM | POA: Diagnosis not present

## 2022-06-25 DIAGNOSIS — D689 Coagulation defect, unspecified: Secondary | ICD-10-CM | POA: Diagnosis not present

## 2022-06-25 DIAGNOSIS — E1122 Type 2 diabetes mellitus with diabetic chronic kidney disease: Secondary | ICD-10-CM | POA: Diagnosis not present

## 2022-06-25 DIAGNOSIS — N2581 Secondary hyperparathyroidism of renal origin: Secondary | ICD-10-CM | POA: Diagnosis not present

## 2022-06-25 DIAGNOSIS — Z992 Dependence on renal dialysis: Secondary | ICD-10-CM | POA: Diagnosis not present

## 2022-06-25 DIAGNOSIS — D631 Anemia in chronic kidney disease: Secondary | ICD-10-CM | POA: Diagnosis not present

## 2022-06-27 NOTE — Progress Notes (Unsigned)
POST OPERATIVE OFFICE NOTE    CC:  F/u for surgery  HPI:  This is a 57 y.o. female who is s/p revision of RUA AVF with a new jump graft from the brachial artery to the upper arm basilic vein using Gore-Tex graft and placement of IJ on 05/28/2022 by Dr. Carlis Abbott.  HD hx: Right 1st stage BVT 10/02/2020 Dr. Donzetta Matters Right 2nd stage BVT 11/24/2020 Dr. Carlis Abbott Revision RUA AVF with new jump graft from brachial artery to upper arm basilic with Gortex and TDC placement 05/28/2022 Dr. Carlis Abbott  Pt states she does not have pain/numbness in the right hand.  She was right hand dominant until her stroke and now she uses her left hand mostly.  She states she has some burning in the upper arm.  She also has some swelling in the right arm.    The pt is on dialysis M/W/F at [place] location.   Allergies  Allergen Reactions   Ibuprofen Swelling    Swelling to hands, feet and face    Influenza Vaccines Anaphylaxis and Swelling    fever   Pneumococcal Vaccine Anaphylaxis and Swelling    fever   Promethazine Other (See Comments)    hallucinations   Ambien [Zolpidem] Other (See Comments)    Unknown reaction   Codeine Itching   Cyclobenzaprine Other (See Comments)    hallucinations    Hydrocodone-Acetaminophen Itching   Tape Itching   Tramadol Hcl Other (See Comments)    hallucinations   Wound Dressing Adhesive Itching    Use paper tape   Oxycodone Nausea And Vomiting    Current Outpatient Medications  Medication Sig Dispense Refill   acetaminophen (TYLENOL) 325 MG tablet Take 650 mg by mouth every 6 (six) hours as needed for moderate pain.     amLODipine (NORVASC) 10 MG tablet Take 1 tablet (10 mg total) by mouth daily. Except on dialysis days do not take a dose. 30 tablet 0   calcium acetate (PHOSLO) 667 MG capsule Take 1,334 mg by mouth in the morning, at noon, and at bedtime.     carvedilol (COREG) 25 MG tablet Take 1 tablet (25 mg total) by mouth 2 (two) times daily with a meal. And as directed.  60 tablet 0   clopidogrel (PLAVIX) 75 MG tablet Take 1 tablet (75 mg total) by mouth daily. 30 tablet 0   Continuous Blood Gluc Sensor (FREESTYLE LIBRE 2 SENSOR) MISC USE AS DIRECTED CHANGE EVERY 14 DAYS     diclofenac Sodium (VOLTAREN) 1 % GEL Apply 2 g topically 4 (four) times daily. To leg (Patient taking differently: Apply 2 g topically 4 (four) times daily as needed (pain). To leg) 350 g 0   famotidine (PEPCID) 20 MG tablet Take 1 tablet (20 mg total) by mouth daily. 30 tablet 0   insulin degludec (TRESIBA FLEXTOUCH) 100 UNIT/ML FlexTouch Pen Inject 12 Units into the skin at bedtime. (Patient taking differently: Inject 8-12 Units into the skin at bedtime. Sliding scale) 15 mL 0   insulin lispro (HUMALOG) 100 UNIT/ML KwikPen Inject 5 Units into the skin 3 (three) times daily. 15 mL 0   lidocaine-prilocaine (EMLA) cream Apply 1 Application topically daily as needed (port access).     melatonin 5 MG TABS Take 5 mg by mouth at bedtime as needed (sleep).     metoCLOPramide (REGLAN) 5 MG tablet Take 5 mg by mouth 3 (three) times daily before meals.     nitroGLYCERIN (NITROSTAT) 0.4 MG SL tablet Place  1 tablet (0.4 mg total) under the tongue every 5 (five) minutes as needed for chest pain. 30 tablet 12   polyethylene glycol (MIRALAX / GLYCOLAX) 17 g packet Take 17 g by mouth daily. (Patient taking differently: Take 17 g by mouth daily as needed for moderate constipation.) 30 each 0   rosuvastatin (CRESTOR) 20 MG tablet Take 20 mg by mouth daily.     senna-docusate (SENOKOT-S) 8.6-50 MG tablet Take 1 tablet by mouth 2 (two) times daily as needed for moderate constipation. (Patient taking differently: Take 1 tablet by mouth 2 (two) times daily.) 60 tablet 0   traMADol (ULTRAM) 50 MG tablet Take 1 tablet (50 mg total) by mouth every 6 (six) hours as needed. 15 tablet 0   Current Facility-Administered Medications  Medication Dose Route Frequency Provider Last Rate Last Admin   0.9 %  sodium chloride  infusion  250 mL Intravenous PRN Marty Heck, MD       sodium chloride flush (NS) 0.9 % injection 3 mL  3 mL Intravenous Q12H Marty Heck, MD         ROS:  See HPI  Physical Exam:  Today's Vitals   06/29/22 0854  Resp: 18  Weight: 108 lb (49 kg)  Height: 5' 2"$  (1.575 m)  PainSc: 4    Body mass index is 19.75 kg/m.   Incision:  healing nicely Extremities:   There is a palpable right radial pulse.   Motor and sensory are in tact.   There is a thrill/bruit present.  Access is  easily palpable    Assessment/Plan:  This is a 57 y.o. female who is s/p: RUA AVF with a new jump graft from the brachial artery to the upper arm basilic vein using Gore-Tex graft and placement of IJ on 05/28/2022 by Dr. Carlis Abbott   -the pt does not have evidence of steal.  She does have some burning in the RUA most likely due to nerve irritation. -she has some swelling in the right arm that is not unexpected given the tunneling and she has a right sided TDC.  Discussed with her to keep her arm elevated and swelling should improve but may or may not completely resolve due to her [facility] on the right side.   -pt's access can be used 07/14/2022 to allow swelling to improve.. -if pt has tunneled catheter, this can be removed at the discretion of the dialysis center once the pt's access has been successfully cannulated to their satisfaction.  -discussed with pt that access does not last forever and will need intervention or even new access at some point.  -the pt will follow up as needed.    Leontine Locket, [facility] [phone]  Clinic MD:  Carlis Abbott

## 2022-06-28 DIAGNOSIS — E1122 Type 2 diabetes mellitus with diabetic chronic kidney disease: Secondary | ICD-10-CM | POA: Diagnosis not present

## 2022-06-28 DIAGNOSIS — D509 Iron deficiency anemia, unspecified: Secondary | ICD-10-CM | POA: Diagnosis not present

## 2022-06-28 DIAGNOSIS — N186 End stage renal disease: Secondary | ICD-10-CM | POA: Diagnosis not present

## 2022-06-28 DIAGNOSIS — N2581 Secondary hyperparathyroidism of renal origin: Secondary | ICD-10-CM | POA: Diagnosis not present

## 2022-06-28 DIAGNOSIS — D631 Anemia in chronic kidney disease: Secondary | ICD-10-CM | POA: Diagnosis not present

## 2022-06-28 DIAGNOSIS — D689 Coagulation defect, unspecified: Secondary | ICD-10-CM | POA: Diagnosis not present

## 2022-06-28 DIAGNOSIS — R519 Headache, unspecified: Secondary | ICD-10-CM | POA: Diagnosis not present

## 2022-06-28 DIAGNOSIS — Z992 Dependence on renal dialysis: Secondary | ICD-10-CM | POA: Diagnosis not present

## 2022-06-29 ENCOUNTER — Encounter: Payer: Self-pay | Admitting: Physician Assistant

## 2022-06-29 ENCOUNTER — Ambulatory Visit (INDEPENDENT_AMBULATORY_CARE_PROVIDER_SITE_OTHER): Payer: Medicare Other | Admitting: Physician Assistant

## 2022-06-29 VITALS — BP 162/76 | HR 85 | Temp 97.2°F | Resp 18 | Ht 62.0 in | Wt 108.0 lb

## 2022-06-29 DIAGNOSIS — Z992 Dependence on renal dialysis: Secondary | ICD-10-CM

## 2022-06-29 DIAGNOSIS — N186 End stage renal disease: Secondary | ICD-10-CM

## 2022-06-30 DIAGNOSIS — D509 Iron deficiency anemia, unspecified: Secondary | ICD-10-CM | POA: Diagnosis not present

## 2022-06-30 DIAGNOSIS — N186 End stage renal disease: Secondary | ICD-10-CM | POA: Diagnosis not present

## 2022-06-30 DIAGNOSIS — Z992 Dependence on renal dialysis: Secondary | ICD-10-CM | POA: Diagnosis not present

## 2022-06-30 DIAGNOSIS — D689 Coagulation defect, unspecified: Secondary | ICD-10-CM | POA: Diagnosis not present

## 2022-06-30 DIAGNOSIS — D631 Anemia in chronic kidney disease: Secondary | ICD-10-CM | POA: Diagnosis not present

## 2022-06-30 DIAGNOSIS — N2581 Secondary hyperparathyroidism of renal origin: Secondary | ICD-10-CM | POA: Diagnosis not present

## 2022-06-30 DIAGNOSIS — E1122 Type 2 diabetes mellitus with diabetic chronic kidney disease: Secondary | ICD-10-CM | POA: Diagnosis not present

## 2022-06-30 DIAGNOSIS — R519 Headache, unspecified: Secondary | ICD-10-CM | POA: Diagnosis not present

## 2022-07-02 DIAGNOSIS — D689 Coagulation defect, unspecified: Secondary | ICD-10-CM | POA: Diagnosis not present

## 2022-07-02 DIAGNOSIS — D509 Iron deficiency anemia, unspecified: Secondary | ICD-10-CM | POA: Diagnosis not present

## 2022-07-02 DIAGNOSIS — R519 Headache, unspecified: Secondary | ICD-10-CM | POA: Diagnosis not present

## 2022-07-02 DIAGNOSIS — N2581 Secondary hyperparathyroidism of renal origin: Secondary | ICD-10-CM | POA: Diagnosis not present

## 2022-07-02 DIAGNOSIS — E1122 Type 2 diabetes mellitus with diabetic chronic kidney disease: Secondary | ICD-10-CM | POA: Diagnosis not present

## 2022-07-02 DIAGNOSIS — N186 End stage renal disease: Secondary | ICD-10-CM | POA: Diagnosis not present

## 2022-07-02 DIAGNOSIS — Z992 Dependence on renal dialysis: Secondary | ICD-10-CM | POA: Diagnosis not present

## 2022-07-02 DIAGNOSIS — D631 Anemia in chronic kidney disease: Secondary | ICD-10-CM | POA: Diagnosis not present

## 2022-07-05 DIAGNOSIS — E1122 Type 2 diabetes mellitus with diabetic chronic kidney disease: Secondary | ICD-10-CM | POA: Diagnosis not present

## 2022-07-05 DIAGNOSIS — D631 Anemia in chronic kidney disease: Secondary | ICD-10-CM | POA: Diagnosis not present

## 2022-07-05 DIAGNOSIS — N2581 Secondary hyperparathyroidism of renal origin: Secondary | ICD-10-CM | POA: Diagnosis not present

## 2022-07-05 DIAGNOSIS — N186 End stage renal disease: Secondary | ICD-10-CM | POA: Diagnosis not present

## 2022-07-05 DIAGNOSIS — D689 Coagulation defect, unspecified: Secondary | ICD-10-CM | POA: Diagnosis not present

## 2022-07-05 DIAGNOSIS — D509 Iron deficiency anemia, unspecified: Secondary | ICD-10-CM | POA: Diagnosis not present

## 2022-07-05 DIAGNOSIS — Z992 Dependence on renal dialysis: Secondary | ICD-10-CM | POA: Diagnosis not present

## 2022-07-05 DIAGNOSIS — R519 Headache, unspecified: Secondary | ICD-10-CM | POA: Diagnosis not present

## 2022-07-06 DIAGNOSIS — Z1211 Encounter for screening for malignant neoplasm of colon: Secondary | ICD-10-CM | POA: Diagnosis not present

## 2022-07-06 DIAGNOSIS — Z1212 Encounter for screening for malignant neoplasm of rectum: Secondary | ICD-10-CM | POA: Diagnosis not present

## 2022-07-07 DIAGNOSIS — R519 Headache, unspecified: Secondary | ICD-10-CM | POA: Diagnosis not present

## 2022-07-07 DIAGNOSIS — E1122 Type 2 diabetes mellitus with diabetic chronic kidney disease: Secondary | ICD-10-CM | POA: Diagnosis not present

## 2022-07-07 DIAGNOSIS — N186 End stage renal disease: Secondary | ICD-10-CM | POA: Diagnosis not present

## 2022-07-07 DIAGNOSIS — D509 Iron deficiency anemia, unspecified: Secondary | ICD-10-CM | POA: Diagnosis not present

## 2022-07-07 DIAGNOSIS — Z992 Dependence on renal dialysis: Secondary | ICD-10-CM | POA: Diagnosis not present

## 2022-07-07 DIAGNOSIS — D631 Anemia in chronic kidney disease: Secondary | ICD-10-CM | POA: Diagnosis not present

## 2022-07-07 DIAGNOSIS — N2581 Secondary hyperparathyroidism of renal origin: Secondary | ICD-10-CM | POA: Diagnosis not present

## 2022-07-07 DIAGNOSIS — D689 Coagulation defect, unspecified: Secondary | ICD-10-CM | POA: Diagnosis not present

## 2022-07-09 DIAGNOSIS — Z992 Dependence on renal dialysis: Secondary | ICD-10-CM | POA: Diagnosis not present

## 2022-07-09 DIAGNOSIS — D509 Iron deficiency anemia, unspecified: Secondary | ICD-10-CM | POA: Diagnosis not present

## 2022-07-09 DIAGNOSIS — D631 Anemia in chronic kidney disease: Secondary | ICD-10-CM | POA: Diagnosis not present

## 2022-07-09 DIAGNOSIS — N2581 Secondary hyperparathyroidism of renal origin: Secondary | ICD-10-CM | POA: Diagnosis not present

## 2022-07-09 DIAGNOSIS — E1122 Type 2 diabetes mellitus with diabetic chronic kidney disease: Secondary | ICD-10-CM | POA: Diagnosis not present

## 2022-07-09 DIAGNOSIS — N186 End stage renal disease: Secondary | ICD-10-CM | POA: Diagnosis not present

## 2022-07-09 DIAGNOSIS — R519 Headache, unspecified: Secondary | ICD-10-CM | POA: Diagnosis not present

## 2022-07-09 DIAGNOSIS — D689 Coagulation defect, unspecified: Secondary | ICD-10-CM | POA: Diagnosis not present

## 2022-07-12 DIAGNOSIS — N186 End stage renal disease: Secondary | ICD-10-CM | POA: Diagnosis not present

## 2022-07-12 DIAGNOSIS — D631 Anemia in chronic kidney disease: Secondary | ICD-10-CM | POA: Diagnosis not present

## 2022-07-12 DIAGNOSIS — Z992 Dependence on renal dialysis: Secondary | ICD-10-CM | POA: Diagnosis not present

## 2022-07-12 DIAGNOSIS — D509 Iron deficiency anemia, unspecified: Secondary | ICD-10-CM | POA: Diagnosis not present

## 2022-07-12 DIAGNOSIS — R519 Headache, unspecified: Secondary | ICD-10-CM | POA: Diagnosis not present

## 2022-07-12 DIAGNOSIS — N2581 Secondary hyperparathyroidism of renal origin: Secondary | ICD-10-CM | POA: Diagnosis not present

## 2022-07-12 DIAGNOSIS — D689 Coagulation defect, unspecified: Secondary | ICD-10-CM | POA: Diagnosis not present

## 2022-07-12 DIAGNOSIS — E1122 Type 2 diabetes mellitus with diabetic chronic kidney disease: Secondary | ICD-10-CM | POA: Diagnosis not present

## 2022-07-14 DIAGNOSIS — E1122 Type 2 diabetes mellitus with diabetic chronic kidney disease: Secondary | ICD-10-CM | POA: Diagnosis not present

## 2022-07-14 DIAGNOSIS — D631 Anemia in chronic kidney disease: Secondary | ICD-10-CM | POA: Diagnosis not present

## 2022-07-14 DIAGNOSIS — N186 End stage renal disease: Secondary | ICD-10-CM | POA: Diagnosis not present

## 2022-07-14 DIAGNOSIS — D689 Coagulation defect, unspecified: Secondary | ICD-10-CM | POA: Diagnosis not present

## 2022-07-14 DIAGNOSIS — N2581 Secondary hyperparathyroidism of renal origin: Secondary | ICD-10-CM | POA: Diagnosis not present

## 2022-07-14 DIAGNOSIS — Z992 Dependence on renal dialysis: Secondary | ICD-10-CM | POA: Diagnosis not present

## 2022-07-14 DIAGNOSIS — R519 Headache, unspecified: Secondary | ICD-10-CM | POA: Diagnosis not present

## 2022-07-14 DIAGNOSIS — D509 Iron deficiency anemia, unspecified: Secondary | ICD-10-CM | POA: Diagnosis not present

## 2022-07-15 DIAGNOSIS — E1022 Type 1 diabetes mellitus with diabetic chronic kidney disease: Secondary | ICD-10-CM | POA: Diagnosis not present

## 2022-07-15 DIAGNOSIS — N186 End stage renal disease: Secondary | ICD-10-CM | POA: Diagnosis not present

## 2022-07-15 DIAGNOSIS — Z992 Dependence on renal dialysis: Secondary | ICD-10-CM | POA: Diagnosis not present

## 2022-07-16 DIAGNOSIS — N186 End stage renal disease: Secondary | ICD-10-CM | POA: Diagnosis not present

## 2022-07-16 DIAGNOSIS — D689 Coagulation defect, unspecified: Secondary | ICD-10-CM | POA: Diagnosis not present

## 2022-07-16 DIAGNOSIS — D631 Anemia in chronic kidney disease: Secondary | ICD-10-CM | POA: Diagnosis not present

## 2022-07-16 DIAGNOSIS — N2581 Secondary hyperparathyroidism of renal origin: Secondary | ICD-10-CM | POA: Diagnosis not present

## 2022-07-16 DIAGNOSIS — R519 Headache, unspecified: Secondary | ICD-10-CM | POA: Diagnosis not present

## 2022-07-16 DIAGNOSIS — Z992 Dependence on renal dialysis: Secondary | ICD-10-CM | POA: Diagnosis not present

## 2022-07-16 DIAGNOSIS — E1122 Type 2 diabetes mellitus with diabetic chronic kidney disease: Secondary | ICD-10-CM | POA: Diagnosis not present

## 2022-07-16 DIAGNOSIS — D509 Iron deficiency anemia, unspecified: Secondary | ICD-10-CM | POA: Diagnosis not present

## 2022-07-19 DIAGNOSIS — D631 Anemia in chronic kidney disease: Secondary | ICD-10-CM | POA: Diagnosis not present

## 2022-07-19 DIAGNOSIS — R519 Headache, unspecified: Secondary | ICD-10-CM | POA: Diagnosis not present

## 2022-07-19 DIAGNOSIS — D689 Coagulation defect, unspecified: Secondary | ICD-10-CM | POA: Diagnosis not present

## 2022-07-19 DIAGNOSIS — Z992 Dependence on renal dialysis: Secondary | ICD-10-CM | POA: Diagnosis not present

## 2022-07-19 DIAGNOSIS — N2581 Secondary hyperparathyroidism of renal origin: Secondary | ICD-10-CM | POA: Diagnosis not present

## 2022-07-19 DIAGNOSIS — D509 Iron deficiency anemia, unspecified: Secondary | ICD-10-CM | POA: Diagnosis not present

## 2022-07-19 DIAGNOSIS — N186 End stage renal disease: Secondary | ICD-10-CM | POA: Diagnosis not present

## 2022-07-19 DIAGNOSIS — E1122 Type 2 diabetes mellitus with diabetic chronic kidney disease: Secondary | ICD-10-CM | POA: Diagnosis not present

## 2022-07-21 DIAGNOSIS — E1122 Type 2 diabetes mellitus with diabetic chronic kidney disease: Secondary | ICD-10-CM | POA: Diagnosis not present

## 2022-07-21 DIAGNOSIS — D509 Iron deficiency anemia, unspecified: Secondary | ICD-10-CM | POA: Diagnosis not present

## 2022-07-21 DIAGNOSIS — N2581 Secondary hyperparathyroidism of renal origin: Secondary | ICD-10-CM | POA: Diagnosis not present

## 2022-07-21 DIAGNOSIS — N186 End stage renal disease: Secondary | ICD-10-CM | POA: Diagnosis not present

## 2022-07-21 DIAGNOSIS — D631 Anemia in chronic kidney disease: Secondary | ICD-10-CM | POA: Diagnosis not present

## 2022-07-21 DIAGNOSIS — D689 Coagulation defect, unspecified: Secondary | ICD-10-CM | POA: Diagnosis not present

## 2022-07-21 DIAGNOSIS — R519 Headache, unspecified: Secondary | ICD-10-CM | POA: Diagnosis not present

## 2022-07-21 DIAGNOSIS — Z992 Dependence on renal dialysis: Secondary | ICD-10-CM | POA: Diagnosis not present

## 2022-07-23 DIAGNOSIS — R519 Headache, unspecified: Secondary | ICD-10-CM | POA: Diagnosis not present

## 2022-07-23 DIAGNOSIS — E1122 Type 2 diabetes mellitus with diabetic chronic kidney disease: Secondary | ICD-10-CM | POA: Diagnosis not present

## 2022-07-23 DIAGNOSIS — D631 Anemia in chronic kidney disease: Secondary | ICD-10-CM | POA: Diagnosis not present

## 2022-07-23 DIAGNOSIS — D509 Iron deficiency anemia, unspecified: Secondary | ICD-10-CM | POA: Diagnosis not present

## 2022-07-23 DIAGNOSIS — D689 Coagulation defect, unspecified: Secondary | ICD-10-CM | POA: Diagnosis not present

## 2022-07-23 DIAGNOSIS — N186 End stage renal disease: Secondary | ICD-10-CM | POA: Diagnosis not present

## 2022-07-23 DIAGNOSIS — Z992 Dependence on renal dialysis: Secondary | ICD-10-CM | POA: Diagnosis not present

## 2022-07-23 DIAGNOSIS — N2581 Secondary hyperparathyroidism of renal origin: Secondary | ICD-10-CM | POA: Diagnosis not present

## 2022-07-26 DIAGNOSIS — N2581 Secondary hyperparathyroidism of renal origin: Secondary | ICD-10-CM | POA: Diagnosis not present

## 2022-07-26 DIAGNOSIS — Z992 Dependence on renal dialysis: Secondary | ICD-10-CM | POA: Diagnosis not present

## 2022-07-26 DIAGNOSIS — N186 End stage renal disease: Secondary | ICD-10-CM | POA: Diagnosis not present

## 2022-07-26 DIAGNOSIS — E1122 Type 2 diabetes mellitus with diabetic chronic kidney disease: Secondary | ICD-10-CM | POA: Diagnosis not present

## 2022-07-26 DIAGNOSIS — D509 Iron deficiency anemia, unspecified: Secondary | ICD-10-CM | POA: Diagnosis not present

## 2022-07-26 DIAGNOSIS — R519 Headache, unspecified: Secondary | ICD-10-CM | POA: Diagnosis not present

## 2022-07-26 DIAGNOSIS — D689 Coagulation defect, unspecified: Secondary | ICD-10-CM | POA: Diagnosis not present

## 2022-07-26 DIAGNOSIS — D631 Anemia in chronic kidney disease: Secondary | ICD-10-CM | POA: Diagnosis not present

## 2022-07-28 DIAGNOSIS — R519 Headache, unspecified: Secondary | ICD-10-CM | POA: Diagnosis not present

## 2022-07-28 DIAGNOSIS — N2581 Secondary hyperparathyroidism of renal origin: Secondary | ICD-10-CM | POA: Diagnosis not present

## 2022-07-28 DIAGNOSIS — D509 Iron deficiency anemia, unspecified: Secondary | ICD-10-CM | POA: Diagnosis not present

## 2022-07-28 DIAGNOSIS — D631 Anemia in chronic kidney disease: Secondary | ICD-10-CM | POA: Diagnosis not present

## 2022-07-28 DIAGNOSIS — D689 Coagulation defect, unspecified: Secondary | ICD-10-CM | POA: Diagnosis not present

## 2022-07-28 DIAGNOSIS — N186 End stage renal disease: Secondary | ICD-10-CM | POA: Diagnosis not present

## 2022-07-28 DIAGNOSIS — Z992 Dependence on renal dialysis: Secondary | ICD-10-CM | POA: Diagnosis not present

## 2022-07-28 DIAGNOSIS — E1122 Type 2 diabetes mellitus with diabetic chronic kidney disease: Secondary | ICD-10-CM | POA: Diagnosis not present

## 2022-07-30 DIAGNOSIS — N186 End stage renal disease: Secondary | ICD-10-CM | POA: Diagnosis not present

## 2022-07-30 DIAGNOSIS — Z992 Dependence on renal dialysis: Secondary | ICD-10-CM | POA: Diagnosis not present

## 2022-07-30 DIAGNOSIS — D509 Iron deficiency anemia, unspecified: Secondary | ICD-10-CM | POA: Diagnosis not present

## 2022-07-30 DIAGNOSIS — Z794 Long term (current) use of insulin: Secondary | ICD-10-CM | POA: Diagnosis not present

## 2022-07-30 DIAGNOSIS — D631 Anemia in chronic kidney disease: Secondary | ICD-10-CM | POA: Diagnosis not present

## 2022-07-30 DIAGNOSIS — E119 Type 2 diabetes mellitus without complications: Secondary | ICD-10-CM | POA: Diagnosis not present

## 2022-07-30 DIAGNOSIS — N2581 Secondary hyperparathyroidism of renal origin: Secondary | ICD-10-CM | POA: Diagnosis not present

## 2022-07-30 DIAGNOSIS — D689 Coagulation defect, unspecified: Secondary | ICD-10-CM | POA: Diagnosis not present

## 2022-07-30 DIAGNOSIS — E1122 Type 2 diabetes mellitus with diabetic chronic kidney disease: Secondary | ICD-10-CM | POA: Diagnosis not present

## 2022-07-30 DIAGNOSIS — R519 Headache, unspecified: Secondary | ICD-10-CM | POA: Diagnosis not present

## 2022-08-02 DIAGNOSIS — E1122 Type 2 diabetes mellitus with diabetic chronic kidney disease: Secondary | ICD-10-CM | POA: Diagnosis not present

## 2022-08-02 DIAGNOSIS — D509 Iron deficiency anemia, unspecified: Secondary | ICD-10-CM | POA: Diagnosis not present

## 2022-08-02 DIAGNOSIS — D689 Coagulation defect, unspecified: Secondary | ICD-10-CM | POA: Diagnosis not present

## 2022-08-02 DIAGNOSIS — N186 End stage renal disease: Secondary | ICD-10-CM | POA: Diagnosis not present

## 2022-08-02 DIAGNOSIS — D631 Anemia in chronic kidney disease: Secondary | ICD-10-CM | POA: Diagnosis not present

## 2022-08-02 DIAGNOSIS — R519 Headache, unspecified: Secondary | ICD-10-CM | POA: Diagnosis not present

## 2022-08-02 DIAGNOSIS — Z992 Dependence on renal dialysis: Secondary | ICD-10-CM | POA: Diagnosis not present

## 2022-08-02 DIAGNOSIS — N2581 Secondary hyperparathyroidism of renal origin: Secondary | ICD-10-CM | POA: Diagnosis not present

## 2022-08-04 DIAGNOSIS — D631 Anemia in chronic kidney disease: Secondary | ICD-10-CM | POA: Diagnosis not present

## 2022-08-04 DIAGNOSIS — Z992 Dependence on renal dialysis: Secondary | ICD-10-CM | POA: Diagnosis not present

## 2022-08-04 DIAGNOSIS — D689 Coagulation defect, unspecified: Secondary | ICD-10-CM | POA: Diagnosis not present

## 2022-08-04 DIAGNOSIS — N186 End stage renal disease: Secondary | ICD-10-CM | POA: Diagnosis not present

## 2022-08-04 DIAGNOSIS — N2581 Secondary hyperparathyroidism of renal origin: Secondary | ICD-10-CM | POA: Diagnosis not present

## 2022-08-04 DIAGNOSIS — D509 Iron deficiency anemia, unspecified: Secondary | ICD-10-CM | POA: Diagnosis not present

## 2022-08-04 DIAGNOSIS — E1122 Type 2 diabetes mellitus with diabetic chronic kidney disease: Secondary | ICD-10-CM | POA: Diagnosis not present

## 2022-08-04 DIAGNOSIS — R519 Headache, unspecified: Secondary | ICD-10-CM | POA: Diagnosis not present

## 2022-08-06 DIAGNOSIS — N2581 Secondary hyperparathyroidism of renal origin: Secondary | ICD-10-CM | POA: Diagnosis not present

## 2022-08-06 DIAGNOSIS — D631 Anemia in chronic kidney disease: Secondary | ICD-10-CM | POA: Diagnosis not present

## 2022-08-06 DIAGNOSIS — D689 Coagulation defect, unspecified: Secondary | ICD-10-CM | POA: Diagnosis not present

## 2022-08-06 DIAGNOSIS — R519 Headache, unspecified: Secondary | ICD-10-CM | POA: Diagnosis not present

## 2022-08-06 DIAGNOSIS — E1122 Type 2 diabetes mellitus with diabetic chronic kidney disease: Secondary | ICD-10-CM | POA: Diagnosis not present

## 2022-08-06 DIAGNOSIS — N186 End stage renal disease: Secondary | ICD-10-CM | POA: Diagnosis not present

## 2022-08-06 DIAGNOSIS — Z992 Dependence on renal dialysis: Secondary | ICD-10-CM | POA: Diagnosis not present

## 2022-08-06 DIAGNOSIS — D509 Iron deficiency anemia, unspecified: Secondary | ICD-10-CM | POA: Diagnosis not present

## 2022-08-09 DIAGNOSIS — N186 End stage renal disease: Secondary | ICD-10-CM | POA: Diagnosis not present

## 2022-08-09 DIAGNOSIS — D509 Iron deficiency anemia, unspecified: Secondary | ICD-10-CM | POA: Diagnosis not present

## 2022-08-09 DIAGNOSIS — N2581 Secondary hyperparathyroidism of renal origin: Secondary | ICD-10-CM | POA: Diagnosis not present

## 2022-08-09 DIAGNOSIS — R519 Headache, unspecified: Secondary | ICD-10-CM | POA: Diagnosis not present

## 2022-08-09 DIAGNOSIS — Z992 Dependence on renal dialysis: Secondary | ICD-10-CM | POA: Diagnosis not present

## 2022-08-09 DIAGNOSIS — D689 Coagulation defect, unspecified: Secondary | ICD-10-CM | POA: Diagnosis not present

## 2022-08-09 DIAGNOSIS — E1122 Type 2 diabetes mellitus with diabetic chronic kidney disease: Secondary | ICD-10-CM | POA: Diagnosis not present

## 2022-08-09 DIAGNOSIS — D631 Anemia in chronic kidney disease: Secondary | ICD-10-CM | POA: Diagnosis not present

## 2022-08-11 DIAGNOSIS — D509 Iron deficiency anemia, unspecified: Secondary | ICD-10-CM | POA: Diagnosis not present

## 2022-08-11 DIAGNOSIS — R519 Headache, unspecified: Secondary | ICD-10-CM | POA: Diagnosis not present

## 2022-08-11 DIAGNOSIS — N2581 Secondary hyperparathyroidism of renal origin: Secondary | ICD-10-CM | POA: Diagnosis not present

## 2022-08-11 DIAGNOSIS — Z992 Dependence on renal dialysis: Secondary | ICD-10-CM | POA: Diagnosis not present

## 2022-08-11 DIAGNOSIS — N186 End stage renal disease: Secondary | ICD-10-CM | POA: Diagnosis not present

## 2022-08-11 DIAGNOSIS — D689 Coagulation defect, unspecified: Secondary | ICD-10-CM | POA: Diagnosis not present

## 2022-08-11 DIAGNOSIS — E1122 Type 2 diabetes mellitus with diabetic chronic kidney disease: Secondary | ICD-10-CM | POA: Diagnosis not present

## 2022-08-11 DIAGNOSIS — D631 Anemia in chronic kidney disease: Secondary | ICD-10-CM | POA: Diagnosis not present

## 2022-08-13 DIAGNOSIS — R519 Headache, unspecified: Secondary | ICD-10-CM | POA: Diagnosis not present

## 2022-08-13 DIAGNOSIS — N2581 Secondary hyperparathyroidism of renal origin: Secondary | ICD-10-CM | POA: Diagnosis not present

## 2022-08-13 DIAGNOSIS — E1122 Type 2 diabetes mellitus with diabetic chronic kidney disease: Secondary | ICD-10-CM | POA: Diagnosis not present

## 2022-08-13 DIAGNOSIS — D689 Coagulation defect, unspecified: Secondary | ICD-10-CM | POA: Diagnosis not present

## 2022-08-13 DIAGNOSIS — N186 End stage renal disease: Secondary | ICD-10-CM | POA: Diagnosis not present

## 2022-08-13 DIAGNOSIS — Z992 Dependence on renal dialysis: Secondary | ICD-10-CM | POA: Diagnosis not present

## 2022-08-13 DIAGNOSIS — D631 Anemia in chronic kidney disease: Secondary | ICD-10-CM | POA: Diagnosis not present

## 2022-08-13 DIAGNOSIS — D509 Iron deficiency anemia, unspecified: Secondary | ICD-10-CM | POA: Diagnosis not present

## 2022-08-15 DIAGNOSIS — N186 End stage renal disease: Secondary | ICD-10-CM | POA: Diagnosis not present

## 2022-08-15 DIAGNOSIS — E1022 Type 1 diabetes mellitus with diabetic chronic kidney disease: Secondary | ICD-10-CM | POA: Diagnosis not present

## 2022-08-15 DIAGNOSIS — Z992 Dependence on renal dialysis: Secondary | ICD-10-CM | POA: Diagnosis not present

## 2022-08-16 DIAGNOSIS — N2581 Secondary hyperparathyroidism of renal origin: Secondary | ICD-10-CM | POA: Diagnosis not present

## 2022-08-16 DIAGNOSIS — D689 Coagulation defect, unspecified: Secondary | ICD-10-CM | POA: Diagnosis not present

## 2022-08-16 DIAGNOSIS — Z992 Dependence on renal dialysis: Secondary | ICD-10-CM | POA: Diagnosis not present

## 2022-08-16 DIAGNOSIS — N186 End stage renal disease: Secondary | ICD-10-CM | POA: Diagnosis not present

## 2022-08-16 DIAGNOSIS — D631 Anemia in chronic kidney disease: Secondary | ICD-10-CM | POA: Diagnosis not present

## 2022-08-16 DIAGNOSIS — E1122 Type 2 diabetes mellitus with diabetic chronic kidney disease: Secondary | ICD-10-CM | POA: Diagnosis not present

## 2022-08-16 DIAGNOSIS — D509 Iron deficiency anemia, unspecified: Secondary | ICD-10-CM | POA: Diagnosis not present

## 2022-08-17 DIAGNOSIS — R051 Acute cough: Secondary | ICD-10-CM | POA: Diagnosis not present

## 2022-08-17 DIAGNOSIS — Z03818 Encounter for observation for suspected exposure to other biological agents ruled out: Secondary | ICD-10-CM | POA: Diagnosis not present

## 2022-08-17 DIAGNOSIS — R0989 Other specified symptoms and signs involving the circulatory and respiratory systems: Secondary | ICD-10-CM | POA: Diagnosis not present

## 2022-08-17 DIAGNOSIS — K5901 Slow transit constipation: Secondary | ICD-10-CM | POA: Diagnosis not present

## 2022-08-17 DIAGNOSIS — Z992 Dependence on renal dialysis: Secondary | ICD-10-CM | POA: Diagnosis not present

## 2022-08-18 DIAGNOSIS — E1122 Type 2 diabetes mellitus with diabetic chronic kidney disease: Secondary | ICD-10-CM | POA: Diagnosis not present

## 2022-08-18 DIAGNOSIS — D631 Anemia in chronic kidney disease: Secondary | ICD-10-CM | POA: Diagnosis not present

## 2022-08-18 DIAGNOSIS — Z992 Dependence on renal dialysis: Secondary | ICD-10-CM | POA: Diagnosis not present

## 2022-08-18 DIAGNOSIS — N2581 Secondary hyperparathyroidism of renal origin: Secondary | ICD-10-CM | POA: Diagnosis not present

## 2022-08-18 DIAGNOSIS — N186 End stage renal disease: Secondary | ICD-10-CM | POA: Diagnosis not present

## 2022-08-18 DIAGNOSIS — D509 Iron deficiency anemia, unspecified: Secondary | ICD-10-CM | POA: Diagnosis not present

## 2022-08-18 DIAGNOSIS — D689 Coagulation defect, unspecified: Secondary | ICD-10-CM | POA: Diagnosis not present

## 2022-08-19 ENCOUNTER — Telehealth (HOSPITAL_COMMUNITY): Payer: Self-pay | Admitting: *Deleted

## 2022-08-19 DIAGNOSIS — R110 Nausea: Secondary | ICD-10-CM | POA: Diagnosis not present

## 2022-08-19 DIAGNOSIS — R935 Abnormal findings on diagnostic imaging of other abdominal regions, including retroperitoneum: Secondary | ICD-10-CM | POA: Diagnosis not present

## 2022-08-19 DIAGNOSIS — K5909 Other constipation: Secondary | ICD-10-CM | POA: Diagnosis not present

## 2022-08-19 DIAGNOSIS — R1033 Periumbilical pain: Secondary | ICD-10-CM | POA: Diagnosis not present

## 2022-08-19 NOTE — Telephone Encounter (Signed)
Received fax from Dr Corliss Parish requesting [facility] removal. Will give to [facility].

## 2022-08-20 DIAGNOSIS — Z992 Dependence on renal dialysis: Secondary | ICD-10-CM | POA: Diagnosis not present

## 2022-08-20 DIAGNOSIS — N186 End stage renal disease: Secondary | ICD-10-CM | POA: Diagnosis not present

## 2022-08-20 DIAGNOSIS — D509 Iron deficiency anemia, unspecified: Secondary | ICD-10-CM | POA: Diagnosis not present

## 2022-08-20 DIAGNOSIS — E1122 Type 2 diabetes mellitus with diabetic chronic kidney disease: Secondary | ICD-10-CM | POA: Diagnosis not present

## 2022-08-20 DIAGNOSIS — N2581 Secondary hyperparathyroidism of renal origin: Secondary | ICD-10-CM | POA: Diagnosis not present

## 2022-08-20 DIAGNOSIS — D631 Anemia in chronic kidney disease: Secondary | ICD-10-CM | POA: Diagnosis not present

## 2022-08-20 DIAGNOSIS — D689 Coagulation defect, unspecified: Secondary | ICD-10-CM | POA: Diagnosis not present

## 2022-08-23 DIAGNOSIS — Z992 Dependence on renal dialysis: Secondary | ICD-10-CM | POA: Diagnosis not present

## 2022-08-23 DIAGNOSIS — D689 Coagulation defect, unspecified: Secondary | ICD-10-CM | POA: Diagnosis not present

## 2022-08-23 DIAGNOSIS — D631 Anemia in chronic kidney disease: Secondary | ICD-10-CM | POA: Diagnosis not present

## 2022-08-23 DIAGNOSIS — N2581 Secondary hyperparathyroidism of renal origin: Secondary | ICD-10-CM | POA: Diagnosis not present

## 2022-08-23 DIAGNOSIS — N186 End stage renal disease: Secondary | ICD-10-CM | POA: Diagnosis not present

## 2022-08-23 DIAGNOSIS — E1122 Type 2 diabetes mellitus with diabetic chronic kidney disease: Secondary | ICD-10-CM | POA: Diagnosis not present

## 2022-08-23 DIAGNOSIS — D509 Iron deficiency anemia, unspecified: Secondary | ICD-10-CM | POA: Diagnosis not present

## 2022-08-24 ENCOUNTER — Telehealth: Payer: Self-pay

## 2022-08-24 DIAGNOSIS — N186 End stage renal disease: Secondary | ICD-10-CM | POA: Diagnosis not present

## 2022-08-24 DIAGNOSIS — Z452 Encounter for adjustment and management of vascular access device: Secondary | ICD-10-CM | POA: Diagnosis not present

## 2022-08-24 DIAGNOSIS — Z992 Dependence on renal dialysis: Secondary | ICD-10-CM | POA: Diagnosis not present

## 2022-08-24 NOTE — Telephone Encounter (Signed)
Called pt to schedule her TDC removal from referral received. Per pt's daughter, pt had this done today at [facility].

## 2022-08-25 DIAGNOSIS — N2581 Secondary hyperparathyroidism of renal origin: Secondary | ICD-10-CM | POA: Diagnosis not present

## 2022-08-25 DIAGNOSIS — E1122 Type 2 diabetes mellitus with diabetic chronic kidney disease: Secondary | ICD-10-CM | POA: Diagnosis not present

## 2022-08-25 DIAGNOSIS — N186 End stage renal disease: Secondary | ICD-10-CM | POA: Diagnosis not present

## 2022-08-25 DIAGNOSIS — D509 Iron deficiency anemia, unspecified: Secondary | ICD-10-CM | POA: Diagnosis not present

## 2022-08-25 DIAGNOSIS — Z992 Dependence on renal dialysis: Secondary | ICD-10-CM | POA: Diagnosis not present

## 2022-08-25 DIAGNOSIS — D631 Anemia in chronic kidney disease: Secondary | ICD-10-CM | POA: Diagnosis not present

## 2022-08-25 DIAGNOSIS — D689 Coagulation defect, unspecified: Secondary | ICD-10-CM | POA: Diagnosis not present

## 2022-08-26 IMAGING — MR MR HEAD W/O CM
7 series · 48 of 48 positions shown · non-contrast
Comparison: None.

CLINICAL DATA: TIA

EXAM:
MRI HEAD WITHOUT CONTRAST
TECHNIQUE: Multiplanar, multiecho pulse sequences of the brain and surrounding
structures were obtained without intravenous contrast.

[Series 7: DWI · axial · 3.0mm · 1.36mm/px · z∈[-101,+27]mm · 10 of 87 slices shown (1 of 2)]
[im 1/87]
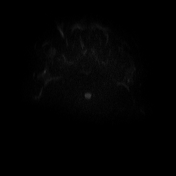
[im 10/87]
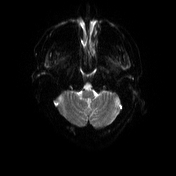
[im 20/87]
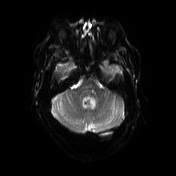
[im 29/87]
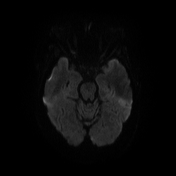
[im 39/87]
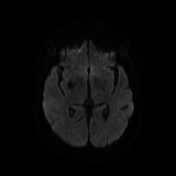
[im 48/87]
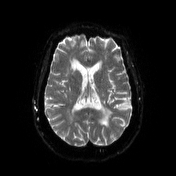
[im 58/87]
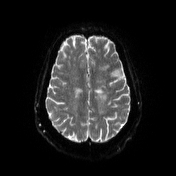
[im 67/87]
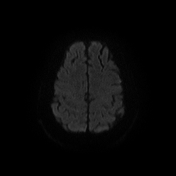
[im 77/87]
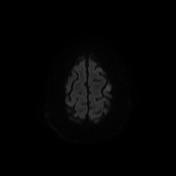
[im 87/87]
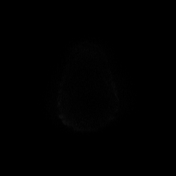

[Series 8: DWI · axial · 3.0mm · 1.36mm/px · z∈[-101,+27]mm · 5 of 44 slices shown (2 of 2)]
[im 1/44]
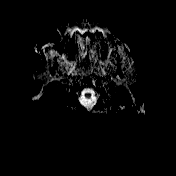
[im 11/44]
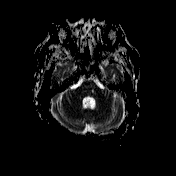
[im 22/44]
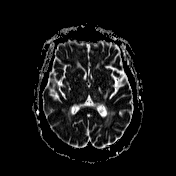
[im 33/44]
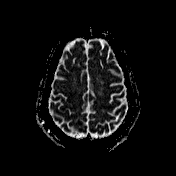
[im 44/44]
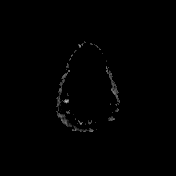

[Series 9: T1 · sagittal · 5.0mm · 0.75mm/px · 3 of 22 slices shown (1 of 2)]
[im 1/22]
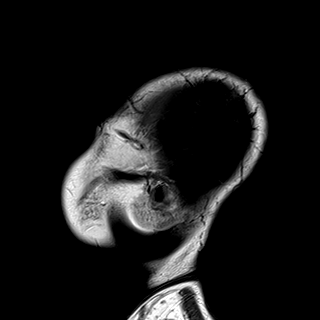
[im 11/22]
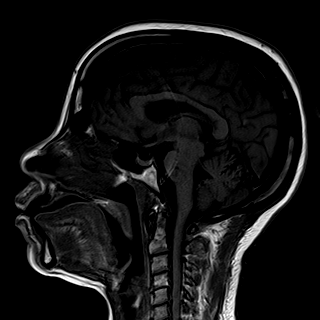
[im 22/22]
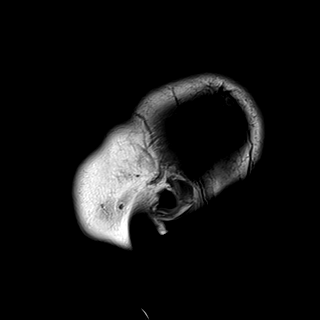

[Series 10: T2 · axial · 5.0mm · 0.75mm/px · z∈[-95,+27]mm · 2 of 20 slices shown]
[im 1/20]
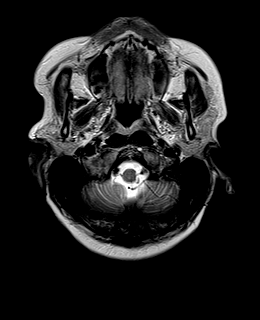
[im 20/20]
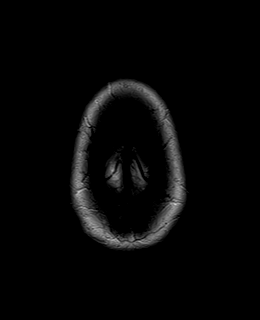

[Series 11: swi_images · axial · 3.0mm · 0.75mm/px · z∈[-104,+36]mm · 6 of 48 slices shown]
[im 1/48]
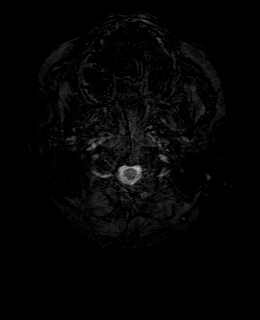
[im 10/48]
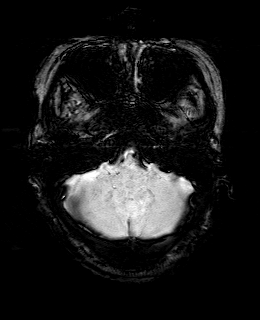
[im 19/48]
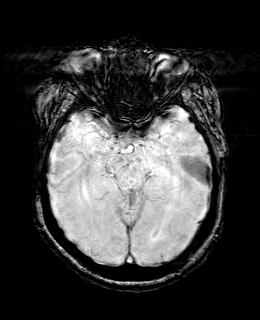
[im 29/48]
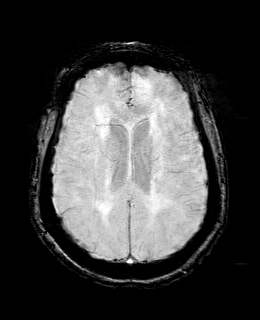
[im 38/48]
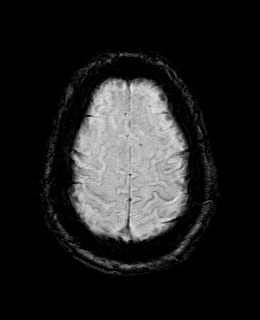
[im 48/48]
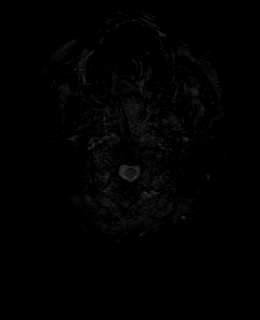

[Series 13: FLAIR · axial · 3.0mm · 0.75mm/px · z∈[-101,+33]mm · 5 of 46 slices shown]
[im 1/46]
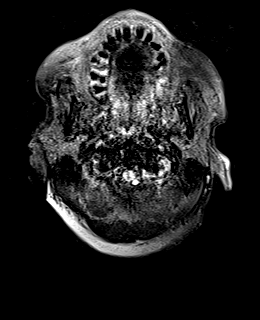
[im 12/46]
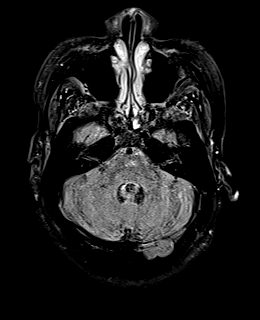
[im 23/46]
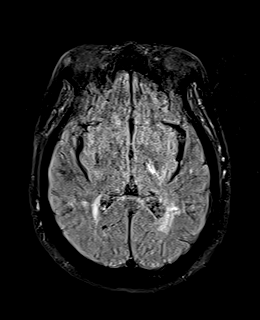
[im 34/46]
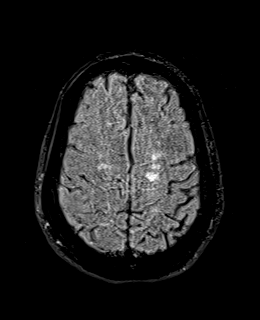
[im 46/46]
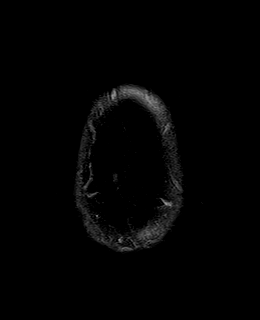

[Series 14: T1 · axial · 1.0mm · 0.94mm/px · z∈[-103,+39]mm · 17 of 144 slices shown (2 of 2)]
[im 1/144]
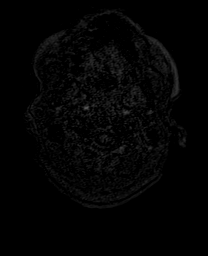
[im 9/144]
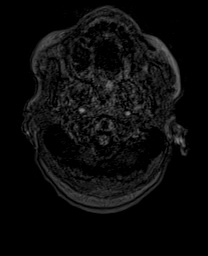
[im 18/144]
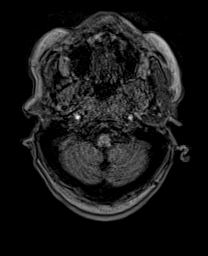
[im 27/144]
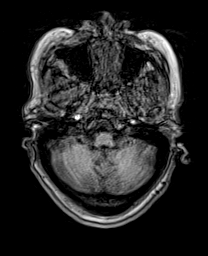
[im 36/144]
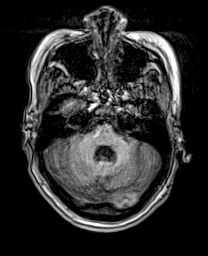
[im 45/144]
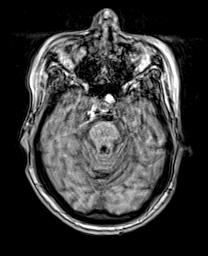
[im 54/144]
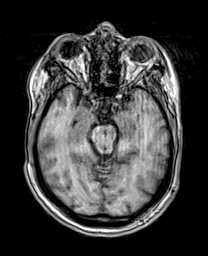
[im 63/144]
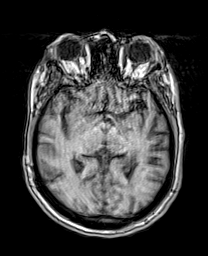
[im 72/144]
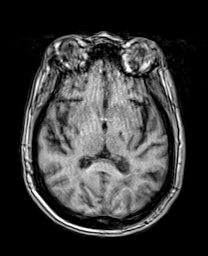
[im 81/144]
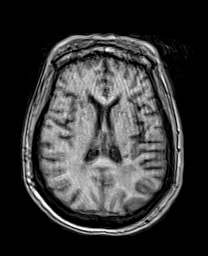
[im 90/144]
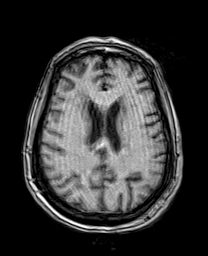
[im 99/144]
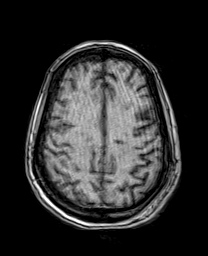
[im 108/144]
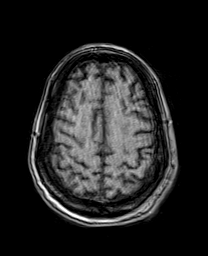
[im 117/144]
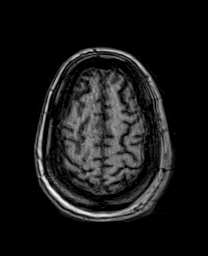
[im 126/144]
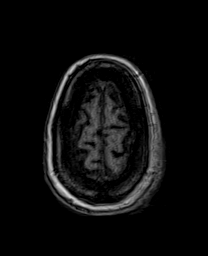
[im 135/144]
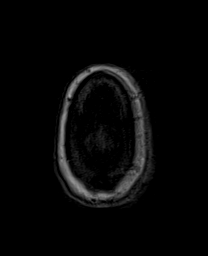
[im 144/144]
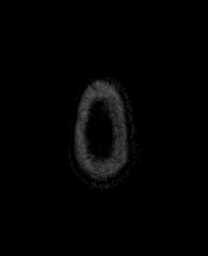

[48 of 48 positions shown; findings below may reference images not displayed]

FINDINGS: Brain: No acute infarct, mass effect or extra-axial collection.
Numerous foci of chronic microhemorrhage in a predominantly central
distribution. Hyperintense T2-weighted signal is moderately
widespread throughout the white matter. Generalized volume loss
without a clear lobar predilection. The midline structures are
normal.

Vascular: Major flow voids are preserved.

Skull and upper cervical spine: Normal calvarium and skull base.
Visualized upper cervical spine and soft tissues are normal.

Sinuses/Orbits:No paranasal sinus fluid levels or advanced mucosal
thickening. No mastoid or middle ear effusion. Normal orbits.
IMPRESSION: 1. No acute intracranial abnormality.
2. Numerous foci of chronic microhemorrhage in a predominantly
central distribution, most consistent with chronic hypertensive
angiopathy.

## 2022-08-27 DIAGNOSIS — E1122 Type 2 diabetes mellitus with diabetic chronic kidney disease: Secondary | ICD-10-CM | POA: Diagnosis not present

## 2022-08-27 DIAGNOSIS — D509 Iron deficiency anemia, unspecified: Secondary | ICD-10-CM | POA: Diagnosis not present

## 2022-08-27 DIAGNOSIS — Z992 Dependence on renal dialysis: Secondary | ICD-10-CM | POA: Diagnosis not present

## 2022-08-27 DIAGNOSIS — D631 Anemia in chronic kidney disease: Secondary | ICD-10-CM | POA: Diagnosis not present

## 2022-08-27 DIAGNOSIS — D689 Coagulation defect, unspecified: Secondary | ICD-10-CM | POA: Diagnosis not present

## 2022-08-27 DIAGNOSIS — N2581 Secondary hyperparathyroidism of renal origin: Secondary | ICD-10-CM | POA: Diagnosis not present

## 2022-08-27 DIAGNOSIS — N186 End stage renal disease: Secondary | ICD-10-CM | POA: Diagnosis not present

## 2022-08-30 DIAGNOSIS — D509 Iron deficiency anemia, unspecified: Secondary | ICD-10-CM | POA: Diagnosis not present

## 2022-08-30 DIAGNOSIS — N2581 Secondary hyperparathyroidism of renal origin: Secondary | ICD-10-CM | POA: Diagnosis not present

## 2022-08-30 DIAGNOSIS — E1122 Type 2 diabetes mellitus with diabetic chronic kidney disease: Secondary | ICD-10-CM | POA: Diagnosis not present

## 2022-08-30 DIAGNOSIS — D631 Anemia in chronic kidney disease: Secondary | ICD-10-CM | POA: Diagnosis not present

## 2022-08-30 DIAGNOSIS — Z992 Dependence on renal dialysis: Secondary | ICD-10-CM | POA: Diagnosis not present

## 2022-08-30 DIAGNOSIS — N186 End stage renal disease: Secondary | ICD-10-CM | POA: Diagnosis not present

## 2022-08-30 DIAGNOSIS — D689 Coagulation defect, unspecified: Secondary | ICD-10-CM | POA: Diagnosis not present

## 2022-09-01 DIAGNOSIS — D631 Anemia in chronic kidney disease: Secondary | ICD-10-CM | POA: Diagnosis not present

## 2022-09-01 DIAGNOSIS — Z992 Dependence on renal dialysis: Secondary | ICD-10-CM | POA: Diagnosis not present

## 2022-09-01 DIAGNOSIS — N2581 Secondary hyperparathyroidism of renal origin: Secondary | ICD-10-CM | POA: Diagnosis not present

## 2022-09-01 DIAGNOSIS — D689 Coagulation defect, unspecified: Secondary | ICD-10-CM | POA: Diagnosis not present

## 2022-09-01 DIAGNOSIS — N186 End stage renal disease: Secondary | ICD-10-CM | POA: Diagnosis not present

## 2022-09-01 DIAGNOSIS — D509 Iron deficiency anemia, unspecified: Secondary | ICD-10-CM | POA: Diagnosis not present

## 2022-09-01 DIAGNOSIS — E1122 Type 2 diabetes mellitus with diabetic chronic kidney disease: Secondary | ICD-10-CM | POA: Diagnosis not present

## 2022-09-03 DIAGNOSIS — Z992 Dependence on renal dialysis: Secondary | ICD-10-CM | POA: Diagnosis not present

## 2022-09-03 DIAGNOSIS — D631 Anemia in chronic kidney disease: Secondary | ICD-10-CM | POA: Diagnosis not present

## 2022-09-03 DIAGNOSIS — N2581 Secondary hyperparathyroidism of renal origin: Secondary | ICD-10-CM | POA: Diagnosis not present

## 2022-09-03 DIAGNOSIS — E1122 Type 2 diabetes mellitus with diabetic chronic kidney disease: Secondary | ICD-10-CM | POA: Diagnosis not present

## 2022-09-03 DIAGNOSIS — N186 End stage renal disease: Secondary | ICD-10-CM | POA: Diagnosis not present

## 2022-09-03 DIAGNOSIS — D509 Iron deficiency anemia, unspecified: Secondary | ICD-10-CM | POA: Diagnosis not present

## 2022-09-03 DIAGNOSIS — D689 Coagulation defect, unspecified: Secondary | ICD-10-CM | POA: Diagnosis not present

## 2022-09-06 DIAGNOSIS — I10 Essential (primary) hypertension: Secondary | ICD-10-CM | POA: Diagnosis not present

## 2022-09-06 DIAGNOSIS — N2581 Secondary hyperparathyroidism of renal origin: Secondary | ICD-10-CM | POA: Diagnosis not present

## 2022-09-06 DIAGNOSIS — N186 End stage renal disease: Secondary | ICD-10-CM | POA: Diagnosis not present

## 2022-09-06 DIAGNOSIS — D689 Coagulation defect, unspecified: Secondary | ICD-10-CM | POA: Diagnosis not present

## 2022-09-06 DIAGNOSIS — E785 Hyperlipidemia, unspecified: Secondary | ICD-10-CM | POA: Diagnosis not present

## 2022-09-06 DIAGNOSIS — Z992 Dependence on renal dialysis: Secondary | ICD-10-CM | POA: Diagnosis not present

## 2022-09-06 DIAGNOSIS — M818 Other osteoporosis without current pathological fracture: Secondary | ICD-10-CM | POA: Diagnosis not present

## 2022-09-06 DIAGNOSIS — I69351 Hemiplegia and hemiparesis following cerebral infarction affecting right dominant side: Secondary | ICD-10-CM | POA: Diagnosis not present

## 2022-09-06 DIAGNOSIS — D631 Anemia in chronic kidney disease: Secondary | ICD-10-CM | POA: Diagnosis not present

## 2022-09-06 DIAGNOSIS — E1165 Type 2 diabetes mellitus with hyperglycemia: Secondary | ICD-10-CM | POA: Diagnosis not present

## 2022-09-06 DIAGNOSIS — E1122 Type 2 diabetes mellitus with diabetic chronic kidney disease: Secondary | ICD-10-CM | POA: Diagnosis not present

## 2022-09-06 DIAGNOSIS — D509 Iron deficiency anemia, unspecified: Secondary | ICD-10-CM | POA: Diagnosis not present

## 2022-09-08 DIAGNOSIS — E1122 Type 2 diabetes mellitus with diabetic chronic kidney disease: Secondary | ICD-10-CM | POA: Diagnosis not present

## 2022-09-08 DIAGNOSIS — D689 Coagulation defect, unspecified: Secondary | ICD-10-CM | POA: Diagnosis not present

## 2022-09-08 DIAGNOSIS — N2581 Secondary hyperparathyroidism of renal origin: Secondary | ICD-10-CM | POA: Diagnosis not present

## 2022-09-08 DIAGNOSIS — Z992 Dependence on renal dialysis: Secondary | ICD-10-CM | POA: Diagnosis not present

## 2022-09-08 DIAGNOSIS — D509 Iron deficiency anemia, unspecified: Secondary | ICD-10-CM | POA: Diagnosis not present

## 2022-09-08 DIAGNOSIS — D631 Anemia in chronic kidney disease: Secondary | ICD-10-CM | POA: Diagnosis not present

## 2022-09-08 DIAGNOSIS — N186 End stage renal disease: Secondary | ICD-10-CM | POA: Diagnosis not present

## 2022-09-10 DIAGNOSIS — D689 Coagulation defect, unspecified: Secondary | ICD-10-CM | POA: Diagnosis not present

## 2022-09-10 DIAGNOSIS — N2581 Secondary hyperparathyroidism of renal origin: Secondary | ICD-10-CM | POA: Diagnosis not present

## 2022-09-10 DIAGNOSIS — E1122 Type 2 diabetes mellitus with diabetic chronic kidney disease: Secondary | ICD-10-CM | POA: Diagnosis not present

## 2022-09-10 DIAGNOSIS — D509 Iron deficiency anemia, unspecified: Secondary | ICD-10-CM | POA: Diagnosis not present

## 2022-09-10 DIAGNOSIS — N186 End stage renal disease: Secondary | ICD-10-CM | POA: Diagnosis not present

## 2022-09-10 DIAGNOSIS — Z992 Dependence on renal dialysis: Secondary | ICD-10-CM | POA: Diagnosis not present

## 2022-09-10 DIAGNOSIS — D631 Anemia in chronic kidney disease: Secondary | ICD-10-CM | POA: Diagnosis not present

## 2022-09-13 DIAGNOSIS — N2581 Secondary hyperparathyroidism of renal origin: Secondary | ICD-10-CM | POA: Diagnosis not present

## 2022-09-13 DIAGNOSIS — D509 Iron deficiency anemia, unspecified: Secondary | ICD-10-CM | POA: Diagnosis not present

## 2022-09-13 DIAGNOSIS — Z992 Dependence on renal dialysis: Secondary | ICD-10-CM | POA: Diagnosis not present

## 2022-09-13 DIAGNOSIS — D631 Anemia in chronic kidney disease: Secondary | ICD-10-CM | POA: Diagnosis not present

## 2022-09-13 DIAGNOSIS — D689 Coagulation defect, unspecified: Secondary | ICD-10-CM | POA: Diagnosis not present

## 2022-09-13 DIAGNOSIS — N186 End stage renal disease: Secondary | ICD-10-CM | POA: Diagnosis not present

## 2022-09-13 DIAGNOSIS — E1122 Type 2 diabetes mellitus with diabetic chronic kidney disease: Secondary | ICD-10-CM | POA: Diagnosis not present

## 2022-09-14 DIAGNOSIS — Z992 Dependence on renal dialysis: Secondary | ICD-10-CM | POA: Diagnosis not present

## 2022-09-14 DIAGNOSIS — E1022 Type 1 diabetes mellitus with diabetic chronic kidney disease: Secondary | ICD-10-CM | POA: Diagnosis not present

## 2022-09-14 DIAGNOSIS — N186 End stage renal disease: Secondary | ICD-10-CM | POA: Diagnosis not present

## 2022-09-16 DIAGNOSIS — N2581 Secondary hyperparathyroidism of renal origin: Secondary | ICD-10-CM | POA: Diagnosis not present

## 2022-09-16 DIAGNOSIS — E1122 Type 2 diabetes mellitus with diabetic chronic kidney disease: Secondary | ICD-10-CM | POA: Diagnosis not present

## 2022-09-16 DIAGNOSIS — N186 End stage renal disease: Secondary | ICD-10-CM | POA: Diagnosis not present

## 2022-09-16 DIAGNOSIS — D509 Iron deficiency anemia, unspecified: Secondary | ICD-10-CM | POA: Diagnosis not present

## 2022-09-16 DIAGNOSIS — L299 Pruritus, unspecified: Secondary | ICD-10-CM | POA: Diagnosis not present

## 2022-09-16 DIAGNOSIS — Z992 Dependence on renal dialysis: Secondary | ICD-10-CM | POA: Diagnosis not present

## 2022-09-16 DIAGNOSIS — R519 Headache, unspecified: Secondary | ICD-10-CM | POA: Diagnosis not present

## 2022-09-16 DIAGNOSIS — D631 Anemia in chronic kidney disease: Secondary | ICD-10-CM | POA: Diagnosis not present

## 2022-09-16 DIAGNOSIS — D689 Coagulation defect, unspecified: Secondary | ICD-10-CM | POA: Diagnosis not present

## 2022-09-18 DIAGNOSIS — N186 End stage renal disease: Secondary | ICD-10-CM | POA: Diagnosis not present

## 2022-09-18 DIAGNOSIS — R519 Headache, unspecified: Secondary | ICD-10-CM | POA: Diagnosis not present

## 2022-09-18 DIAGNOSIS — Z992 Dependence on renal dialysis: Secondary | ICD-10-CM | POA: Diagnosis not present

## 2022-09-18 DIAGNOSIS — D689 Coagulation defect, unspecified: Secondary | ICD-10-CM | POA: Diagnosis not present

## 2022-09-18 DIAGNOSIS — N2581 Secondary hyperparathyroidism of renal origin: Secondary | ICD-10-CM | POA: Diagnosis not present

## 2022-09-18 DIAGNOSIS — D509 Iron deficiency anemia, unspecified: Secondary | ICD-10-CM | POA: Diagnosis not present

## 2022-09-18 DIAGNOSIS — E1122 Type 2 diabetes mellitus with diabetic chronic kidney disease: Secondary | ICD-10-CM | POA: Diagnosis not present

## 2022-09-18 DIAGNOSIS — D631 Anemia in chronic kidney disease: Secondary | ICD-10-CM | POA: Diagnosis not present

## 2022-09-18 DIAGNOSIS — L299 Pruritus, unspecified: Secondary | ICD-10-CM | POA: Diagnosis not present

## 2022-09-20 DIAGNOSIS — L299 Pruritus, unspecified: Secondary | ICD-10-CM | POA: Diagnosis not present

## 2022-09-20 DIAGNOSIS — D631 Anemia in chronic kidney disease: Secondary | ICD-10-CM | POA: Diagnosis not present

## 2022-09-20 DIAGNOSIS — N2581 Secondary hyperparathyroidism of renal origin: Secondary | ICD-10-CM | POA: Diagnosis not present

## 2022-09-20 DIAGNOSIS — D689 Coagulation defect, unspecified: Secondary | ICD-10-CM | POA: Diagnosis not present

## 2022-09-20 DIAGNOSIS — D509 Iron deficiency anemia, unspecified: Secondary | ICD-10-CM | POA: Diagnosis not present

## 2022-09-20 DIAGNOSIS — Z992 Dependence on renal dialysis: Secondary | ICD-10-CM | POA: Diagnosis not present

## 2022-09-20 DIAGNOSIS — R519 Headache, unspecified: Secondary | ICD-10-CM | POA: Diagnosis not present

## 2022-09-20 DIAGNOSIS — N186 End stage renal disease: Secondary | ICD-10-CM | POA: Diagnosis not present

## 2022-09-20 DIAGNOSIS — E1122 Type 2 diabetes mellitus with diabetic chronic kidney disease: Secondary | ICD-10-CM | POA: Diagnosis not present

## 2022-09-22 DIAGNOSIS — D631 Anemia in chronic kidney disease: Secondary | ICD-10-CM | POA: Diagnosis not present

## 2022-09-22 DIAGNOSIS — E1122 Type 2 diabetes mellitus with diabetic chronic kidney disease: Secondary | ICD-10-CM | POA: Diagnosis not present

## 2022-09-22 DIAGNOSIS — L299 Pruritus, unspecified: Secondary | ICD-10-CM | POA: Diagnosis not present

## 2022-09-22 DIAGNOSIS — Z992 Dependence on renal dialysis: Secondary | ICD-10-CM | POA: Diagnosis not present

## 2022-09-22 DIAGNOSIS — R519 Headache, unspecified: Secondary | ICD-10-CM | POA: Diagnosis not present

## 2022-09-22 DIAGNOSIS — N186 End stage renal disease: Secondary | ICD-10-CM | POA: Diagnosis not present

## 2022-09-22 DIAGNOSIS — D509 Iron deficiency anemia, unspecified: Secondary | ICD-10-CM | POA: Diagnosis not present

## 2022-09-22 DIAGNOSIS — D689 Coagulation defect, unspecified: Secondary | ICD-10-CM | POA: Diagnosis not present

## 2022-09-22 DIAGNOSIS — N2581 Secondary hyperparathyroidism of renal origin: Secondary | ICD-10-CM | POA: Diagnosis not present

## 2022-09-24 DIAGNOSIS — E1122 Type 2 diabetes mellitus with diabetic chronic kidney disease: Secondary | ICD-10-CM | POA: Diagnosis not present

## 2022-09-24 DIAGNOSIS — D631 Anemia in chronic kidney disease: Secondary | ICD-10-CM | POA: Diagnosis not present

## 2022-09-24 DIAGNOSIS — N186 End stage renal disease: Secondary | ICD-10-CM | POA: Diagnosis not present

## 2022-09-24 DIAGNOSIS — D689 Coagulation defect, unspecified: Secondary | ICD-10-CM | POA: Diagnosis not present

## 2022-09-24 DIAGNOSIS — Z992 Dependence on renal dialysis: Secondary | ICD-10-CM | POA: Diagnosis not present

## 2022-09-24 DIAGNOSIS — N2581 Secondary hyperparathyroidism of renal origin: Secondary | ICD-10-CM | POA: Diagnosis not present

## 2022-09-24 DIAGNOSIS — R519 Headache, unspecified: Secondary | ICD-10-CM | POA: Diagnosis not present

## 2022-09-24 DIAGNOSIS — D509 Iron deficiency anemia, unspecified: Secondary | ICD-10-CM | POA: Diagnosis not present

## 2022-09-24 DIAGNOSIS — L299 Pruritus, unspecified: Secondary | ICD-10-CM | POA: Diagnosis not present

## 2022-09-27 DIAGNOSIS — E1122 Type 2 diabetes mellitus with diabetic chronic kidney disease: Secondary | ICD-10-CM | POA: Diagnosis not present

## 2022-09-27 DIAGNOSIS — D631 Anemia in chronic kidney disease: Secondary | ICD-10-CM | POA: Diagnosis not present

## 2022-09-27 DIAGNOSIS — R519 Headache, unspecified: Secondary | ICD-10-CM | POA: Diagnosis not present

## 2022-09-27 DIAGNOSIS — L299 Pruritus, unspecified: Secondary | ICD-10-CM | POA: Diagnosis not present

## 2022-09-27 DIAGNOSIS — Z992 Dependence on renal dialysis: Secondary | ICD-10-CM | POA: Diagnosis not present

## 2022-09-27 DIAGNOSIS — D689 Coagulation defect, unspecified: Secondary | ICD-10-CM | POA: Diagnosis not present

## 2022-09-27 DIAGNOSIS — D509 Iron deficiency anemia, unspecified: Secondary | ICD-10-CM | POA: Diagnosis not present

## 2022-09-27 DIAGNOSIS — N2581 Secondary hyperparathyroidism of renal origin: Secondary | ICD-10-CM | POA: Diagnosis not present

## 2022-09-27 DIAGNOSIS — N186 End stage renal disease: Secondary | ICD-10-CM | POA: Diagnosis not present

## 2022-09-28 ENCOUNTER — Encounter (HOSPITAL_COMMUNITY): Payer: Self-pay | Admitting: Vascular Surgery

## 2022-09-28 DIAGNOSIS — R1033 Periumbilical pain: Secondary | ICD-10-CM | POA: Diagnosis not present

## 2022-09-28 DIAGNOSIS — Z7902 Long term (current) use of antithrombotics/antiplatelets: Secondary | ICD-10-CM | POA: Diagnosis not present

## 2022-09-28 DIAGNOSIS — Z955 Presence of coronary angioplasty implant and graft: Secondary | ICD-10-CM | POA: Diagnosis not present

## 2022-09-28 DIAGNOSIS — R109 Unspecified abdominal pain: Secondary | ICD-10-CM | POA: Diagnosis not present

## 2022-09-28 DIAGNOSIS — I120 Hypertensive chronic kidney disease with stage 5 chronic kidney disease or end stage renal disease: Secondary | ICD-10-CM | POA: Diagnosis not present

## 2022-09-28 DIAGNOSIS — K3189 Other diseases of stomach and duodenum: Secondary | ICD-10-CM | POA: Diagnosis not present

## 2022-09-28 DIAGNOSIS — K2950 Unspecified chronic gastritis without bleeding: Secondary | ICD-10-CM | POA: Diagnosis not present

## 2022-09-28 DIAGNOSIS — Z8673 Personal history of transient ischemic attack (TIA), and cerebral infarction without residual deficits: Secondary | ICD-10-CM | POA: Diagnosis not present

## 2022-09-28 DIAGNOSIS — Z79899 Other long term (current) drug therapy: Secondary | ICD-10-CM | POA: Diagnosis not present

## 2022-09-28 DIAGNOSIS — R110 Nausea: Secondary | ICD-10-CM | POA: Diagnosis not present

## 2022-09-28 DIAGNOSIS — Z9049 Acquired absence of other specified parts of digestive tract: Secondary | ICD-10-CM | POA: Diagnosis not present

## 2022-09-28 DIAGNOSIS — Z794 Long term (current) use of insulin: Secondary | ICD-10-CM | POA: Diagnosis not present

## 2022-09-28 DIAGNOSIS — E1122 Type 2 diabetes mellitus with diabetic chronic kidney disease: Secondary | ICD-10-CM | POA: Diagnosis not present

## 2022-09-28 DIAGNOSIS — N186 End stage renal disease: Secondary | ICD-10-CM | POA: Diagnosis not present

## 2022-09-28 DIAGNOSIS — R933 Abnormal findings on diagnostic imaging of other parts of digestive tract: Secondary | ICD-10-CM | POA: Diagnosis not present

## 2022-10-01 DIAGNOSIS — Z992 Dependence on renal dialysis: Secondary | ICD-10-CM | POA: Diagnosis not present

## 2022-10-01 DIAGNOSIS — N2581 Secondary hyperparathyroidism of renal origin: Secondary | ICD-10-CM | POA: Diagnosis not present

## 2022-10-01 DIAGNOSIS — D631 Anemia in chronic kidney disease: Secondary | ICD-10-CM | POA: Diagnosis not present

## 2022-10-01 DIAGNOSIS — E1122 Type 2 diabetes mellitus with diabetic chronic kidney disease: Secondary | ICD-10-CM | POA: Diagnosis not present

## 2022-10-01 DIAGNOSIS — R519 Headache, unspecified: Secondary | ICD-10-CM | POA: Diagnosis not present

## 2022-10-01 DIAGNOSIS — D689 Coagulation defect, unspecified: Secondary | ICD-10-CM | POA: Diagnosis not present

## 2022-10-01 DIAGNOSIS — L299 Pruritus, unspecified: Secondary | ICD-10-CM | POA: Diagnosis not present

## 2022-10-01 DIAGNOSIS — N186 End stage renal disease: Secondary | ICD-10-CM | POA: Diagnosis not present

## 2022-10-01 DIAGNOSIS — D509 Iron deficiency anemia, unspecified: Secondary | ICD-10-CM | POA: Diagnosis not present

## 2022-10-04 DIAGNOSIS — D509 Iron deficiency anemia, unspecified: Secondary | ICD-10-CM | POA: Diagnosis not present

## 2022-10-04 DIAGNOSIS — N186 End stage renal disease: Secondary | ICD-10-CM | POA: Diagnosis not present

## 2022-10-04 DIAGNOSIS — D689 Coagulation defect, unspecified: Secondary | ICD-10-CM | POA: Diagnosis not present

## 2022-10-04 DIAGNOSIS — R519 Headache, unspecified: Secondary | ICD-10-CM | POA: Diagnosis not present

## 2022-10-04 DIAGNOSIS — D631 Anemia in chronic kidney disease: Secondary | ICD-10-CM | POA: Diagnosis not present

## 2022-10-04 DIAGNOSIS — N2581 Secondary hyperparathyroidism of renal origin: Secondary | ICD-10-CM | POA: Diagnosis not present

## 2022-10-04 DIAGNOSIS — Z992 Dependence on renal dialysis: Secondary | ICD-10-CM | POA: Diagnosis not present

## 2022-10-04 DIAGNOSIS — L299 Pruritus, unspecified: Secondary | ICD-10-CM | POA: Diagnosis not present

## 2022-10-04 DIAGNOSIS — E1122 Type 2 diabetes mellitus with diabetic chronic kidney disease: Secondary | ICD-10-CM | POA: Diagnosis not present

## 2022-10-06 DIAGNOSIS — L299 Pruritus, unspecified: Secondary | ICD-10-CM | POA: Diagnosis not present

## 2022-10-06 DIAGNOSIS — E1122 Type 2 diabetes mellitus with diabetic chronic kidney disease: Secondary | ICD-10-CM | POA: Diagnosis not present

## 2022-10-06 DIAGNOSIS — D509 Iron deficiency anemia, unspecified: Secondary | ICD-10-CM | POA: Diagnosis not present

## 2022-10-06 DIAGNOSIS — R519 Headache, unspecified: Secondary | ICD-10-CM | POA: Diagnosis not present

## 2022-10-06 DIAGNOSIS — Z992 Dependence on renal dialysis: Secondary | ICD-10-CM | POA: Diagnosis not present

## 2022-10-06 DIAGNOSIS — D689 Coagulation defect, unspecified: Secondary | ICD-10-CM | POA: Diagnosis not present

## 2022-10-06 DIAGNOSIS — N2581 Secondary hyperparathyroidism of renal origin: Secondary | ICD-10-CM | POA: Diagnosis not present

## 2022-10-06 DIAGNOSIS — N186 End stage renal disease: Secondary | ICD-10-CM | POA: Diagnosis not present

## 2022-10-06 DIAGNOSIS — D631 Anemia in chronic kidney disease: Secondary | ICD-10-CM | POA: Diagnosis not present

## 2022-10-06 NOTE — Progress Notes (Deleted)
No chief complaint on file.   HISTORY OF PRESENT ILLNESS:  10/06/22 ALL:  Hannah [name] is a 57 y.o. female here today for follow up for history of multiple chronic CVAs. She was last seen by Dr Pearlean Brownie 03/2022. A1C was 7.5. LDL 41. PT/OT orders placed. She had initial eval but.. Carotid doppler repeated 04/2022 and was unremarkable.   She continues Plavix and rosuvastatin. She is followed by PCP regularly. She continues dialysis.   There has been a concerns of questionable MS diagnosis through previously neurology provider in [place] around 2009. MRI 04/2022 showed advanced white matter disease most likely extensive chronic small vessel ischemia with multiple chronic lacunar infarcts, however demyelinating disease possible.   HISTORY (copied from Dr Marlis Edelson previous note)   HPI: Hannah [name] is a 57 year old African-American lady seen today for initial office consultation visit.  She is accompanied by her daughter.  History is provided by them and review of electronic medical records and opossum reviewed pertinent available imaging in PACS and results in care everywhere .  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  Patient apparently has never been started on treatment for multiple sclerosis or followed up with any Neurologist for the same diagnosis.  I apparently saw her in 2019 and asked for records from previous neurologist but never received them and Patient was lost to follow-up MRI scan of the brain in [facility] in August 2017 showed enhancing right pontine subacute and as well as old left basal  ganglia, left cerebellar peduncle neurology degeneration subacute moderate MRI on 07/11/2017 also shows old bilateral lacunar left basal ganglia and left great infarcts.  Patient states she apparently saw me in 2019 but was unable to get my office records.  Patient apparently was able to ambulate at baseline without walker but needed some assistance until January 2021 when she had worsening renal failure and overall physical decline she can help with transfers and can go to the restroom but needs 1 person assist.  24-hour care and cannot be left her cognition is fine and she has meaningful conversation and can patient states she is unable to walk because she feels dizzy .She denies true vertigo, diplopia creased focal weakness.  She was seen in the ER at [facility] on 06/09/2020 for worsening vision.  Her last MRI in the system was from 01/12/2021 and showed normal numerous chronic.  Foci of microhemorrhage in the central distribution compatible with chronic hypertensive angiopathy. Patient denies any recent acute symptoms to suggest stroke or TIA or active multiple sclerosis.  REVIEW OF SYSTEMS: Out of a complete 14 system review of symptoms, the patient complains only of the following symptoms, and all other reviewed systems are negative.   ALLERGIES: Allergies  Allergen Reactions   Ibuprofen Swelling    Swelling to hands, feet and face    Influenza Vaccines Anaphylaxis and Swelling    fever   Pneumococcal Vaccine Anaphylaxis and Swelling    fever   Promethazine Other (See Comments)    hallucinations   Ambien [Zolpidem] Other (See Comments)    Unknown reaction   Codeine Itching   Cyclobenzaprine Other (See  Comments)    hallucinations    Hydrocodone-Acetaminophen Itching   Tape Itching   Tramadol Hcl Other (See Comments)    hallucinations   Wound Dressing Adhesive Itching    Use paper tape   Oxycodone Nausea And Vomiting     HOME MEDICATIONS: Outpatient Medications Prior to Visit   Medication Sig Dispense Refill   acetaminophen (TYLENOL) 325 MG tablet Take 650 mg by mouth every 6 (six) hours as needed for moderate pain.     amLODipine (NORVASC) 10 MG tablet Take 1 tablet (10 mg total) by mouth daily. Except on dialysis days do not take a dose. 30 tablet 0   calcium acetate (PHOSLO) 667 MG capsule Take 1,334 mg by mouth in the morning, at noon, and at bedtime.     carvedilol (COREG) 25 MG tablet Take 1 tablet (25 mg total) by mouth 2 (two) times daily with a meal. And as directed. 60 tablet 0   clopidogrel (PLAVIX) 75 MG tablet Take 1 tablet (75 mg total) by mouth daily. 30 tablet 0   Continuous Blood Gluc Sensor (FREESTYLE LIBRE 2 SENSOR) MISC USE AS DIRECTED CHANGE EVERY 14 DAYS     diclofenac Sodium (VOLTAREN) 1 % GEL Apply 2 g topically 4 (four) times daily. To leg (Patient taking differently: Apply 2 g topically 4 (four) times daily as needed (pain). To leg) 350 g 0   famotidine (PEPCID) 20 MG tablet Take 1 tablet (20 mg total) by mouth daily. 30 tablet 0   insulin degludec (TRESIBA FLEXTOUCH) 100 UNIT/ML FlexTouch Pen Inject 12 Units into the skin at bedtime. (Patient taking differently: Inject 8-12 Units into the skin at bedtime. Sliding scale) 15 mL 0   insulin lispro (HUMALOG) 100 UNIT/ML KwikPen Inject 5 Units into the skin 3 (three) times daily. 15 mL 0   lidocaine-prilocaine (EMLA) cream Apply 1 Application topically daily as needed (port access).     melatonin 5 MG TABS Take 5 mg by mouth at bedtime as needed (sleep).     metoCLOPramide (REGLAN) 5 MG tablet Take 5 mg by mouth 3 (three) times daily before meals.     nitroGLYCERIN (NITROSTAT) 0.4 MG SL tablet Place 1 tablet (0.4 mg total) under the tongue every 5 (five) minutes as needed for chest pain. 30 tablet 12   polyethylene glycol (MIRALAX / GLYCOLAX) 17 g packet Take 17 g by mouth daily. (Patient taking differently: Take 17 g by mouth daily as needed for moderate constipation.) 30 each 0   rosuvastatin  (CRESTOR) 20 MG tablet Take 20 mg by mouth daily.     senna-docusate (SENOKOT-S) 8.6-50 MG tablet Take 1 tablet by mouth 2 (two) times daily as needed for moderate constipation. (Patient taking differently: Take 1 tablet by mouth 2 (two) times daily.) 60 tablet 0   traMADol (ULTRAM) 50 MG tablet Take 1 tablet (50 mg total) by mouth every 6 (six) hours as needed. 15 tablet 0   Facility-Administered Medications Prior to Visit  Medication Dose Route Frequency Provider Last Rate Last Admin   0.9 %  sodium chloride infusion  250 mL Intravenous PRN Cephus Shelling, MD       sodium chloride flush (NS) 0.9 % injection 3 mL  3 mL Intravenous Q12H Cephus Shelling, MD         PAST MEDICAL HISTORY: Past Medical History:  Diagnosis Date   CAD S/P PCI x 2 2009   MI 2009 -> PCI x 2 ([place]); 01/2016 Nuc ST  Nonischemic with Normal EF   Cerebral infarction (HCC) 07/12/2017   2009 first one, has had a total of 4 stokes- last 2021   Chronic kidney disease    Hemo M-W- F   Diabetes type 2, controlled (HCC)    Hemiparesis affecting right side as late effect of cerebrovascular accident (CVA) (HCC)    Hyperlipidemia    Hypertension    Intractable vomiting with nausea 07/12/2017   Multiple sclerosis (HCC) 2009   Myocardial infarction (HCC) 2009   Stroke [facility])      PAST SURGICAL HISTORY: Past Surgical History:  Procedure Laterality Date   A/V FISTULAGRAM Right 02/12/2021   Procedure: A/V FISTULAGRAM;  Surgeon: Cephus Shelling, MD;  Location: [facility] INVASIVE CV LAB;  Service: Cardiovascular;  Laterality: Right;   A/V FISTULAGRAM Right 05/20/2022   Procedure: A/V Fistulagram;  Surgeon: Cephus Shelling, MD;  Location: [facility] INVASIVE CV LAB;  Service: Cardiovascular;  Laterality: Right;   APPENDECTOMY     AV FISTULA PLACEMENT Right 10/02/2020   Procedure: RIGHT ARTERIOVENOUS (AV) FISTULA CREATION;  Surgeon: Maeola Harman, MD;  Location: [facility] OR;  Service: Vascular;  Laterality:  Right;   AV FISTULA PLACEMENT Right 05/28/2022   Procedure: RIGHT ARM ARTERIOVENOUS (AV) FISTULA REVISION WITH GORETEX GRAFT;  Surgeon: Cephus Shelling, MD;  Location: [facility] OR;  Service: Vascular;  Laterality: Right;   BASCILIC VEIN TRANSPOSITION Right 11/24/2020   Procedure: SECOND STAGE RIGHT ARM BASCILIC VEIN TRANSPOSITION;  Surgeon: Cephus Shelling, MD;  Location: [facility] OR;  Service: Vascular;  Laterality: Right;   CYSTOSTOMY W/ BLADDER BIOPSY     INSERTION OF DIALYSIS CATHETER Right 05/28/2022   Procedure: INSERTION OF TUNNELED DIALYSIS CATHETER IN THE RIGHT INTERNAL JUGULAR;  Surgeon: Cephus Shelling, MD;  Location: [facility] OR;  Service: Vascular;  Laterality: Right;   IR FLUORO GUIDE CV LINE RIGHT  09/29/2020   IR US GUIDE VASC ACCESS RIGHT  09/29/2020   NM MYOVIEW LTD  12/25/2015   [place], [place] - NORMAL /NEGATIVE for ischemia. Normal LV Fxn   OVARIAN CYST SURGERY     PERCUTANEOUS CORONARY STENT INTERVENTION (PCI-S)  2009   [place], [place] - 2 stents in settng of MI -- unknown details   PERIPHERAL VASCULAR BALLOON ANGIOPLASTY Right 02/12/2021   Procedure: PERIPHERAL VASCULAR BALLOON ANGIOPLASTY;  Surgeon: Cephus Shelling, MD;  Location: [facility] INVASIVE CV LAB;  Service: Cardiovascular;  Laterality: Right;  UPPER ARM FISTULA   PERIPHERAL VASCULAR BALLOON ANGIOPLASTY  05/20/2022   Procedure: PERIPHERAL VASCULAR BALLOON ANGIOPLASTY;  Surgeon: Cephus Shelling, MD;  Location: [facility] INVASIVE CV LAB;  Service: Cardiovascular;;  rt arm fistula site   PERIPHERAL VASCULAR BALLOON ANGIOPLASTY Right 01/28/2022   Procedure: PERIPHERAL VASCULAR BALLOON ANGIOPLASTY;  Surgeon: Cephus Shelling, MD;  Location: [facility] INVASIVE CV LAB;  Service: Cardiovascular;  Laterality: Right;  Arm fistula   TRANSTHORACIC ECHOCARDIOGRAM  07/11/2017   a) 12/2015, [facility], [place]): Moderate Conc LVH w/ sigmoid septum.  No LVOT obstruction.  Hyperdynamic LV fxn - EF 65 to 70%.  GR 1 DD & high LAP/LVEDP.  AoV  Sclerosis - No AS;; b) 06/2017: [facility]): Normal LV Fxn - EF 60%.  Abnormal septal motion.  Mild LVH mild MAC.  Negative bubble study (for CVA)   TRANSTHORACIC ECHOCARDIOGRAM  12/2020   a) 05/2020: EF 60-65%. No RWMA. Gr2DD. Sm posterior pericardial effusion. No AS.; b) 09/2020: EF 70-75%.  Hyperdynamic.  No RWMA. ? D Fxn/LAP.  Mildly elevated PAP.  Pericardial effusion.  Mild ao V sclerosis cyst.; c) 12/2020 (Limited): EF 65-70%. No RWMA. Gr1-2 DD. Mild Conc LVH- high LAP. Nl PAP. Circumferential effusion w/o tamponade. AoV Sclerosis - no AS. Nl RAP.   ULTRASOUND GUIDANCE FOR VASCULAR ACCESS Right 05/28/2022   Procedure: ULTRASOUND GUIDANCE FOR VASCULAR ACCESS;  Surgeon: Cephus Shelling, MD;  Location: [facility] OR;  Service: Vascular;  Laterality: Right;     FAMILY HISTORY: No family history on file.   SOCIAL HISTORY: Social History   Socioeconomic History   Marital status: Divorced    Spouse name: Not on file   Number of children: Not on file   Years of education: Not on file   Highest education level: Not on file  Occupational History   Occupation: disabled  Tobacco Use   Smoking status: Never   Smokeless tobacco: Never  Vaping Use   Vaping Use: Never used  Substance and Sexual Activity   Alcohol use: Not Currently   Drug use: Never   Sexual activity: Not on file  Other Topics Concern   Not on file  Social History Narrative   Not on file   Social Determinants of Health   Financial Resource Strain: Not on file  Food Insecurity: No Food Insecurity (11/10/2020)   Hunger Vital Sign    Worried About Running Out of Food in the Last Year: Never true    Ran Out of Food in the Last Year: Never true  Transportation Needs: No Transportation Needs (11/10/2020)   PRAPARE - Administrator, Civil Service (Medical): No    Lack of Transportation (Non-Medical): No  Physical Activity: Not on file  Stress: Not on file  Social Connections: Not on file   Intimate Partner Violence: Not on file     PHYSICAL EXAM  There were no vitals filed for this visit. There is no height or weight on file to calculate BMI.  Generalized: Well developed, in no acute distress  Cardiology: normal rate and rhythm, no murmur auscultated  Respiratory: clear to auscultation bilaterally    Neurological examination  Mentation: Alert oriented to time, place, history taking. Follows all commands speech and language fluent Cranial nerve II-XII: Pupils were equal round reactive to light. Extraocular movements were full, visual field were full on confrontational test. Facial sensation and strength were normal. Uvula tongue midline. Head turning and shoulder shrug  were normal and symmetric. Motor: The motor testing reveals 5 over 5 strength of all 4 extremities. Good symmetric motor tone is noted throughout.  Sensory: Sensory testing is intact to soft touch on all 4 extremities. No evidence of extinction is noted.  Coordination: Cerebellar testing reveals good finger-nose-finger and heel-to-shin bilaterally.  Gait and station: Gait is normal. Tandem gait is normal. Romberg is negative. No drift is seen.  Reflexes: Deep tendon reflexes are symmetric and normal bilaterally.    DIAGNOSTIC DATA (LABS, IMAGING, TESTING) - I reviewed patient records, labs, notes, testing and imaging myself where available.  Lab Results  Component Value Date   WBC 8.4 05/05/2022   HGB 10.2 (L) 05/28/2022   HCT 30.0 (L) 05/28/2022   MCV 82.4 05/05/2022   PLT 452 (H) 05/05/2022      Component Value Date/Time   NA 138 05/28/2022 0613   K 4.1 05/28/2022 0613   CL 98 05/28/2022 0613   CO2 29 05/05/2022 1243   GLUCOSE 148 (H) 05/28/2022 0613   BUN 24 (H) 05/28/2022 0613   CREATININE 4.90 (H) 05/28/2022 0613   CALCIUM 8.5 (  L) 05/05/2022 1243   PROT 7.3 05/05/2022 1243   ALBUMIN 3.7 05/05/2022 1243   AST 15 05/05/2022 1243   ALT 11 05/05/2022 1243   ALKPHOS 63 05/05/2022 1243    BILITOT 0.4 05/05/2022 1243   GFRNONAA 23 (L) 05/05/2022 1243   Lab Results  Component Value Date   CHOL 133 04/01/2022   HDL 80 04/01/2022   LDLCALC 41 04/01/2022   TRIG 52 04/01/2022   CHOLHDL 1.7 04/01/2022   Lab Results  Component Value Date   HGBA1C 7.5 (H) 04/01/2022   Lab Results  Component Value Date   VITAMINB12 566 01/13/2021   Lab Results  Component Value Date   TSH 0.966 01/13/2021        No data to display               No data to display           ASSESSMENT AND PLAN  57 y.o. year old female  has a past medical history of CAD S/P PCI x 2 (2009), Cerebral infarction (HCC) (07/12/2017), Chronic kidney disease, Diabetes type 2, controlled (HCC), Hemiparesis affecting right side as late effect of cerebrovascular accident (CVA) (HCC), Hyperlipidemia, Hypertension, Intractable vomiting with nausea (07/12/2017), Multiple sclerosis (HCC) (2009), Myocardial infarction (HCC) (2009), and Stroke [facility]). here with    No diagnosis found.  Kalenna Christell [name] ***.  Healthy lifestyle habits encouraged. *** will follow up with PCP as directed. *** will return to see me in ***, sooner if needed. *** verbalizes understanding and agreement with this plan.   No orders of the defined types were placed in this encounter.    No orders of the defined types were placed in this encounter.    Shawnie Dapper, MSN, FNP-C 10/06/2022, 12:16 PM  [facility] [address], [address] [place], [place] [address] [phone]

## 2022-10-07 ENCOUNTER — Ambulatory Visit: Payer: Medicare Other | Admitting: Family Medicine

## 2022-10-07 DIAGNOSIS — I871 Compression of vein: Secondary | ICD-10-CM | POA: Diagnosis not present

## 2022-10-07 DIAGNOSIS — I679 Cerebrovascular disease, unspecified: Secondary | ICD-10-CM

## 2022-10-07 DIAGNOSIS — N186 End stage renal disease: Secondary | ICD-10-CM | POA: Diagnosis not present

## 2022-10-07 DIAGNOSIS — Z992 Dependence on renal dialysis: Secondary | ICD-10-CM | POA: Diagnosis not present

## 2022-10-07 DIAGNOSIS — T82858A Stenosis of vascular prosthetic devices, implants and grafts, initial encounter: Secondary | ICD-10-CM | POA: Diagnosis not present

## 2022-10-08 DIAGNOSIS — L299 Pruritus, unspecified: Secondary | ICD-10-CM | POA: Diagnosis not present

## 2022-10-08 DIAGNOSIS — N186 End stage renal disease: Secondary | ICD-10-CM | POA: Diagnosis not present

## 2022-10-08 DIAGNOSIS — N2581 Secondary hyperparathyroidism of renal origin: Secondary | ICD-10-CM | POA: Diagnosis not present

## 2022-10-08 DIAGNOSIS — E1122 Type 2 diabetes mellitus with diabetic chronic kidney disease: Secondary | ICD-10-CM | POA: Diagnosis not present

## 2022-10-08 DIAGNOSIS — Z992 Dependence on renal dialysis: Secondary | ICD-10-CM | POA: Diagnosis not present

## 2022-10-08 DIAGNOSIS — D509 Iron deficiency anemia, unspecified: Secondary | ICD-10-CM | POA: Diagnosis not present

## 2022-10-08 DIAGNOSIS — D689 Coagulation defect, unspecified: Secondary | ICD-10-CM | POA: Diagnosis not present

## 2022-10-08 DIAGNOSIS — R519 Headache, unspecified: Secondary | ICD-10-CM | POA: Diagnosis not present

## 2022-10-08 DIAGNOSIS — D631 Anemia in chronic kidney disease: Secondary | ICD-10-CM | POA: Diagnosis not present

## 2022-10-11 DIAGNOSIS — Z992 Dependence on renal dialysis: Secondary | ICD-10-CM | POA: Diagnosis not present

## 2022-10-11 DIAGNOSIS — R519 Headache, unspecified: Secondary | ICD-10-CM | POA: Diagnosis not present

## 2022-10-11 DIAGNOSIS — D689 Coagulation defect, unspecified: Secondary | ICD-10-CM | POA: Diagnosis not present

## 2022-10-11 DIAGNOSIS — N2581 Secondary hyperparathyroidism of renal origin: Secondary | ICD-10-CM | POA: Diagnosis not present

## 2022-10-11 DIAGNOSIS — N186 End stage renal disease: Secondary | ICD-10-CM | POA: Diagnosis not present

## 2022-10-11 DIAGNOSIS — D509 Iron deficiency anemia, unspecified: Secondary | ICD-10-CM | POA: Diagnosis not present

## 2022-10-11 DIAGNOSIS — D631 Anemia in chronic kidney disease: Secondary | ICD-10-CM | POA: Diagnosis not present

## 2022-10-11 DIAGNOSIS — E1122 Type 2 diabetes mellitus with diabetic chronic kidney disease: Secondary | ICD-10-CM | POA: Diagnosis not present

## 2022-10-11 DIAGNOSIS — L299 Pruritus, unspecified: Secondary | ICD-10-CM | POA: Diagnosis not present

## 2022-10-13 DIAGNOSIS — R519 Headache, unspecified: Secondary | ICD-10-CM | POA: Diagnosis not present

## 2022-10-13 DIAGNOSIS — D689 Coagulation defect, unspecified: Secondary | ICD-10-CM | POA: Diagnosis not present

## 2022-10-13 DIAGNOSIS — D631 Anemia in chronic kidney disease: Secondary | ICD-10-CM | POA: Diagnosis not present

## 2022-10-13 DIAGNOSIS — L299 Pruritus, unspecified: Secondary | ICD-10-CM | POA: Diagnosis not present

## 2022-10-13 DIAGNOSIS — N186 End stage renal disease: Secondary | ICD-10-CM | POA: Diagnosis not present

## 2022-10-13 DIAGNOSIS — Z992 Dependence on renal dialysis: Secondary | ICD-10-CM | POA: Diagnosis not present

## 2022-10-13 DIAGNOSIS — E1122 Type 2 diabetes mellitus with diabetic chronic kidney disease: Secondary | ICD-10-CM | POA: Diagnosis not present

## 2022-10-13 DIAGNOSIS — D509 Iron deficiency anemia, unspecified: Secondary | ICD-10-CM | POA: Diagnosis not present

## 2022-10-13 DIAGNOSIS — N2581 Secondary hyperparathyroidism of renal origin: Secondary | ICD-10-CM | POA: Diagnosis not present

## 2022-10-15 DIAGNOSIS — E1122 Type 2 diabetes mellitus with diabetic chronic kidney disease: Secondary | ICD-10-CM | POA: Diagnosis not present

## 2022-10-15 DIAGNOSIS — E1110 Type 2 diabetes mellitus with ketoacidosis without coma: Secondary | ICD-10-CM | POA: Diagnosis not present

## 2022-10-15 DIAGNOSIS — N186 End stage renal disease: Secondary | ICD-10-CM | POA: Diagnosis not present

## 2022-10-15 DIAGNOSIS — R519 Headache, unspecified: Secondary | ICD-10-CM | POA: Diagnosis not present

## 2022-10-15 DIAGNOSIS — D631 Anemia in chronic kidney disease: Secondary | ICD-10-CM | POA: Diagnosis not present

## 2022-10-15 DIAGNOSIS — D689 Coagulation defect, unspecified: Secondary | ICD-10-CM | POA: Diagnosis not present

## 2022-10-15 DIAGNOSIS — D509 Iron deficiency anemia, unspecified: Secondary | ICD-10-CM | POA: Diagnosis not present

## 2022-10-15 DIAGNOSIS — Z992 Dependence on renal dialysis: Secondary | ICD-10-CM | POA: Diagnosis not present

## 2022-10-15 DIAGNOSIS — N2581 Secondary hyperparathyroidism of renal origin: Secondary | ICD-10-CM | POA: Diagnosis not present

## 2022-10-15 DIAGNOSIS — L299 Pruritus, unspecified: Secondary | ICD-10-CM | POA: Diagnosis not present

## 2022-10-19 DIAGNOSIS — R519 Headache, unspecified: Secondary | ICD-10-CM | POA: Diagnosis not present

## 2022-10-19 DIAGNOSIS — D689 Coagulation defect, unspecified: Secondary | ICD-10-CM | POA: Diagnosis not present

## 2022-10-19 DIAGNOSIS — Z992 Dependence on renal dialysis: Secondary | ICD-10-CM | POA: Diagnosis not present

## 2022-10-19 DIAGNOSIS — D509 Iron deficiency anemia, unspecified: Secondary | ICD-10-CM | POA: Diagnosis not present

## 2022-10-19 DIAGNOSIS — E1122 Type 2 diabetes mellitus with diabetic chronic kidney disease: Secondary | ICD-10-CM | POA: Diagnosis not present

## 2022-10-19 DIAGNOSIS — N186 End stage renal disease: Secondary | ICD-10-CM | POA: Diagnosis not present

## 2022-10-19 DIAGNOSIS — N2581 Secondary hyperparathyroidism of renal origin: Secondary | ICD-10-CM | POA: Diagnosis not present

## 2022-10-19 DIAGNOSIS — D631 Anemia in chronic kidney disease: Secondary | ICD-10-CM | POA: Diagnosis not present

## 2022-10-22 DIAGNOSIS — Z992 Dependence on renal dialysis: Secondary | ICD-10-CM | POA: Diagnosis not present

## 2022-10-22 DIAGNOSIS — N186 End stage renal disease: Secondary | ICD-10-CM | POA: Diagnosis not present

## 2022-10-22 DIAGNOSIS — D689 Coagulation defect, unspecified: Secondary | ICD-10-CM | POA: Diagnosis not present

## 2022-10-22 DIAGNOSIS — N2581 Secondary hyperparathyroidism of renal origin: Secondary | ICD-10-CM | POA: Diagnosis not present

## 2022-10-22 DIAGNOSIS — R519 Headache, unspecified: Secondary | ICD-10-CM | POA: Diagnosis not present

## 2022-10-22 DIAGNOSIS — E1122 Type 2 diabetes mellitus with diabetic chronic kidney disease: Secondary | ICD-10-CM | POA: Diagnosis not present

## 2022-10-22 DIAGNOSIS — D631 Anemia in chronic kidney disease: Secondary | ICD-10-CM | POA: Diagnosis not present

## 2022-10-22 DIAGNOSIS — D509 Iron deficiency anemia, unspecified: Secondary | ICD-10-CM | POA: Diagnosis not present

## 2022-10-25 DIAGNOSIS — N186 End stage renal disease: Secondary | ICD-10-CM | POA: Diagnosis not present

## 2022-10-25 DIAGNOSIS — N2581 Secondary hyperparathyroidism of renal origin: Secondary | ICD-10-CM | POA: Diagnosis not present

## 2022-10-25 DIAGNOSIS — R519 Headache, unspecified: Secondary | ICD-10-CM | POA: Diagnosis not present

## 2022-10-25 DIAGNOSIS — D509 Iron deficiency anemia, unspecified: Secondary | ICD-10-CM | POA: Diagnosis not present

## 2022-10-25 DIAGNOSIS — Z992 Dependence on renal dialysis: Secondary | ICD-10-CM | POA: Diagnosis not present

## 2022-10-25 DIAGNOSIS — D689 Coagulation defect, unspecified: Secondary | ICD-10-CM | POA: Diagnosis not present

## 2022-10-25 DIAGNOSIS — E119 Type 2 diabetes mellitus without complications: Secondary | ICD-10-CM | POA: Diagnosis not present

## 2022-10-25 DIAGNOSIS — D631 Anemia in chronic kidney disease: Secondary | ICD-10-CM | POA: Diagnosis not present

## 2022-10-25 DIAGNOSIS — E1122 Type 2 diabetes mellitus with diabetic chronic kidney disease: Secondary | ICD-10-CM | POA: Diagnosis not present

## 2022-10-27 DIAGNOSIS — Z992 Dependence on renal dialysis: Secondary | ICD-10-CM | POA: Diagnosis not present

## 2022-10-27 DIAGNOSIS — D509 Iron deficiency anemia, unspecified: Secondary | ICD-10-CM | POA: Diagnosis not present

## 2022-10-27 DIAGNOSIS — R519 Headache, unspecified: Secondary | ICD-10-CM | POA: Diagnosis not present

## 2022-10-27 DIAGNOSIS — N2581 Secondary hyperparathyroidism of renal origin: Secondary | ICD-10-CM | POA: Diagnosis not present

## 2022-10-27 DIAGNOSIS — E1122 Type 2 diabetes mellitus with diabetic chronic kidney disease: Secondary | ICD-10-CM | POA: Diagnosis not present

## 2022-10-27 DIAGNOSIS — N186 End stage renal disease: Secondary | ICD-10-CM | POA: Diagnosis not present

## 2022-10-27 DIAGNOSIS — D631 Anemia in chronic kidney disease: Secondary | ICD-10-CM | POA: Diagnosis not present

## 2022-10-27 DIAGNOSIS — D689 Coagulation defect, unspecified: Secondary | ICD-10-CM | POA: Diagnosis not present

## 2022-10-29 DIAGNOSIS — D509 Iron deficiency anemia, unspecified: Secondary | ICD-10-CM | POA: Diagnosis not present

## 2022-10-29 DIAGNOSIS — N186 End stage renal disease: Secondary | ICD-10-CM | POA: Diagnosis not present

## 2022-10-29 DIAGNOSIS — D689 Coagulation defect, unspecified: Secondary | ICD-10-CM | POA: Diagnosis not present

## 2022-10-29 DIAGNOSIS — Z992 Dependence on renal dialysis: Secondary | ICD-10-CM | POA: Diagnosis not present

## 2022-10-29 DIAGNOSIS — E1122 Type 2 diabetes mellitus with diabetic chronic kidney disease: Secondary | ICD-10-CM | POA: Diagnosis not present

## 2022-10-29 DIAGNOSIS — D631 Anemia in chronic kidney disease: Secondary | ICD-10-CM | POA: Diagnosis not present

## 2022-10-29 DIAGNOSIS — N2581 Secondary hyperparathyroidism of renal origin: Secondary | ICD-10-CM | POA: Diagnosis not present

## 2022-10-29 DIAGNOSIS — R519 Headache, unspecified: Secondary | ICD-10-CM | POA: Diagnosis not present

## 2022-11-01 DIAGNOSIS — D509 Iron deficiency anemia, unspecified: Secondary | ICD-10-CM | POA: Diagnosis not present

## 2022-11-01 DIAGNOSIS — N186 End stage renal disease: Secondary | ICD-10-CM | POA: Diagnosis not present

## 2022-11-01 DIAGNOSIS — N2581 Secondary hyperparathyroidism of renal origin: Secondary | ICD-10-CM | POA: Diagnosis not present

## 2022-11-01 DIAGNOSIS — Z992 Dependence on renal dialysis: Secondary | ICD-10-CM | POA: Diagnosis not present

## 2022-11-01 DIAGNOSIS — E1122 Type 2 diabetes mellitus with diabetic chronic kidney disease: Secondary | ICD-10-CM | POA: Diagnosis not present

## 2022-11-01 DIAGNOSIS — D689 Coagulation defect, unspecified: Secondary | ICD-10-CM | POA: Diagnosis not present

## 2022-11-01 DIAGNOSIS — R519 Headache, unspecified: Secondary | ICD-10-CM | POA: Diagnosis not present

## 2022-11-01 DIAGNOSIS — D631 Anemia in chronic kidney disease: Secondary | ICD-10-CM | POA: Diagnosis not present

## 2022-11-03 DIAGNOSIS — D631 Anemia in chronic kidney disease: Secondary | ICD-10-CM | POA: Diagnosis not present

## 2022-11-03 DIAGNOSIS — Z992 Dependence on renal dialysis: Secondary | ICD-10-CM | POA: Diagnosis not present

## 2022-11-03 DIAGNOSIS — D689 Coagulation defect, unspecified: Secondary | ICD-10-CM | POA: Diagnosis not present

## 2022-11-03 DIAGNOSIS — N186 End stage renal disease: Secondary | ICD-10-CM | POA: Diagnosis not present

## 2022-11-03 DIAGNOSIS — R519 Headache, unspecified: Secondary | ICD-10-CM | POA: Diagnosis not present

## 2022-11-03 DIAGNOSIS — N2581 Secondary hyperparathyroidism of renal origin: Secondary | ICD-10-CM | POA: Diagnosis not present

## 2022-11-03 DIAGNOSIS — E1122 Type 2 diabetes mellitus with diabetic chronic kidney disease: Secondary | ICD-10-CM | POA: Diagnosis not present

## 2022-11-03 DIAGNOSIS — D509 Iron deficiency anemia, unspecified: Secondary | ICD-10-CM | POA: Diagnosis not present

## 2022-11-05 DIAGNOSIS — D509 Iron deficiency anemia, unspecified: Secondary | ICD-10-CM | POA: Diagnosis not present

## 2022-11-05 DIAGNOSIS — Z992 Dependence on renal dialysis: Secondary | ICD-10-CM | POA: Diagnosis not present

## 2022-11-05 DIAGNOSIS — D631 Anemia in chronic kidney disease: Secondary | ICD-10-CM | POA: Diagnosis not present

## 2022-11-05 DIAGNOSIS — D689 Coagulation defect, unspecified: Secondary | ICD-10-CM | POA: Diagnosis not present

## 2022-11-05 DIAGNOSIS — R519 Headache, unspecified: Secondary | ICD-10-CM | POA: Diagnosis not present

## 2022-11-05 DIAGNOSIS — N186 End stage renal disease: Secondary | ICD-10-CM | POA: Diagnosis not present

## 2022-11-05 DIAGNOSIS — N2581 Secondary hyperparathyroidism of renal origin: Secondary | ICD-10-CM | POA: Diagnosis not present

## 2022-11-05 DIAGNOSIS — E1122 Type 2 diabetes mellitus with diabetic chronic kidney disease: Secondary | ICD-10-CM | POA: Diagnosis not present

## 2022-11-08 DIAGNOSIS — D631 Anemia in chronic kidney disease: Secondary | ICD-10-CM | POA: Diagnosis not present

## 2022-11-08 DIAGNOSIS — D689 Coagulation defect, unspecified: Secondary | ICD-10-CM | POA: Diagnosis not present

## 2022-11-08 DIAGNOSIS — N186 End stage renal disease: Secondary | ICD-10-CM | POA: Diagnosis not present

## 2022-11-08 DIAGNOSIS — Z992 Dependence on renal dialysis: Secondary | ICD-10-CM | POA: Diagnosis not present

## 2022-11-08 DIAGNOSIS — D509 Iron deficiency anemia, unspecified: Secondary | ICD-10-CM | POA: Diagnosis not present

## 2022-11-08 DIAGNOSIS — E1122 Type 2 diabetes mellitus with diabetic chronic kidney disease: Secondary | ICD-10-CM | POA: Diagnosis not present

## 2022-11-08 DIAGNOSIS — N2581 Secondary hyperparathyroidism of renal origin: Secondary | ICD-10-CM | POA: Diagnosis not present

## 2022-11-08 DIAGNOSIS — R519 Headache, unspecified: Secondary | ICD-10-CM | POA: Diagnosis not present

## 2022-11-10 DIAGNOSIS — N186 End stage renal disease: Secondary | ICD-10-CM | POA: Diagnosis not present

## 2022-11-10 DIAGNOSIS — D689 Coagulation defect, unspecified: Secondary | ICD-10-CM | POA: Diagnosis not present

## 2022-11-10 DIAGNOSIS — R519 Headache, unspecified: Secondary | ICD-10-CM | POA: Diagnosis not present

## 2022-11-10 DIAGNOSIS — D509 Iron deficiency anemia, unspecified: Secondary | ICD-10-CM | POA: Diagnosis not present

## 2022-11-10 DIAGNOSIS — Z992 Dependence on renal dialysis: Secondary | ICD-10-CM | POA: Diagnosis not present

## 2022-11-10 DIAGNOSIS — E1122 Type 2 diabetes mellitus with diabetic chronic kidney disease: Secondary | ICD-10-CM | POA: Diagnosis not present

## 2022-11-10 DIAGNOSIS — N2581 Secondary hyperparathyroidism of renal origin: Secondary | ICD-10-CM | POA: Diagnosis not present

## 2022-11-10 DIAGNOSIS — D631 Anemia in chronic kidney disease: Secondary | ICD-10-CM | POA: Diagnosis not present

## 2022-11-11 DIAGNOSIS — H44113 Panuveitis, bilateral: Secondary | ICD-10-CM | POA: Diagnosis not present

## 2022-11-12 DIAGNOSIS — R519 Headache, unspecified: Secondary | ICD-10-CM | POA: Diagnosis not present

## 2022-11-12 DIAGNOSIS — D509 Iron deficiency anemia, unspecified: Secondary | ICD-10-CM | POA: Diagnosis not present

## 2022-11-12 DIAGNOSIS — D631 Anemia in chronic kidney disease: Secondary | ICD-10-CM | POA: Diagnosis not present

## 2022-11-12 DIAGNOSIS — N186 End stage renal disease: Secondary | ICD-10-CM | POA: Diagnosis not present

## 2022-11-12 DIAGNOSIS — N2581 Secondary hyperparathyroidism of renal origin: Secondary | ICD-10-CM | POA: Diagnosis not present

## 2022-11-12 DIAGNOSIS — D689 Coagulation defect, unspecified: Secondary | ICD-10-CM | POA: Diagnosis not present

## 2022-11-12 DIAGNOSIS — E1122 Type 2 diabetes mellitus with diabetic chronic kidney disease: Secondary | ICD-10-CM | POA: Diagnosis not present

## 2022-11-12 DIAGNOSIS — Z992 Dependence on renal dialysis: Secondary | ICD-10-CM | POA: Diagnosis not present

## 2022-11-14 DIAGNOSIS — E1022 Type 1 diabetes mellitus with diabetic chronic kidney disease: Secondary | ICD-10-CM | POA: Diagnosis not present

## 2022-11-14 DIAGNOSIS — Z992 Dependence on renal dialysis: Secondary | ICD-10-CM | POA: Diagnosis not present

## 2022-11-14 DIAGNOSIS — N186 End stage renal disease: Secondary | ICD-10-CM | POA: Diagnosis not present

## 2022-11-15 DIAGNOSIS — E1122 Type 2 diabetes mellitus with diabetic chronic kidney disease: Secondary | ICD-10-CM | POA: Diagnosis not present

## 2022-11-15 DIAGNOSIS — N2581 Secondary hyperparathyroidism of renal origin: Secondary | ICD-10-CM | POA: Diagnosis not present

## 2022-11-15 DIAGNOSIS — N186 End stage renal disease: Secondary | ICD-10-CM | POA: Diagnosis not present

## 2022-11-15 DIAGNOSIS — Z992 Dependence on renal dialysis: Secondary | ICD-10-CM | POA: Diagnosis not present

## 2022-11-15 DIAGNOSIS — D631 Anemia in chronic kidney disease: Secondary | ICD-10-CM | POA: Diagnosis not present

## 2022-11-15 DIAGNOSIS — D689 Coagulation defect, unspecified: Secondary | ICD-10-CM | POA: Diagnosis not present

## 2022-11-15 DIAGNOSIS — R519 Headache, unspecified: Secondary | ICD-10-CM | POA: Diagnosis not present

## 2022-11-15 DIAGNOSIS — D509 Iron deficiency anemia, unspecified: Secondary | ICD-10-CM | POA: Diagnosis not present

## 2022-11-17 DIAGNOSIS — Z992 Dependence on renal dialysis: Secondary | ICD-10-CM | POA: Diagnosis not present

## 2022-11-17 DIAGNOSIS — D509 Iron deficiency anemia, unspecified: Secondary | ICD-10-CM | POA: Diagnosis not present

## 2022-11-17 DIAGNOSIS — N2581 Secondary hyperparathyroidism of renal origin: Secondary | ICD-10-CM | POA: Diagnosis not present

## 2022-11-17 DIAGNOSIS — D689 Coagulation defect, unspecified: Secondary | ICD-10-CM | POA: Diagnosis not present

## 2022-11-17 DIAGNOSIS — N186 End stage renal disease: Secondary | ICD-10-CM | POA: Diagnosis not present

## 2022-11-17 DIAGNOSIS — R519 Headache, unspecified: Secondary | ICD-10-CM | POA: Diagnosis not present

## 2022-11-17 DIAGNOSIS — D631 Anemia in chronic kidney disease: Secondary | ICD-10-CM | POA: Diagnosis not present

## 2022-11-17 DIAGNOSIS — E1122 Type 2 diabetes mellitus with diabetic chronic kidney disease: Secondary | ICD-10-CM | POA: Diagnosis not present

## 2022-11-19 DIAGNOSIS — R519 Headache, unspecified: Secondary | ICD-10-CM | POA: Diagnosis not present

## 2022-11-19 DIAGNOSIS — D689 Coagulation defect, unspecified: Secondary | ICD-10-CM | POA: Diagnosis not present

## 2022-11-19 DIAGNOSIS — D509 Iron deficiency anemia, unspecified: Secondary | ICD-10-CM | POA: Diagnosis not present

## 2022-11-19 DIAGNOSIS — N2581 Secondary hyperparathyroidism of renal origin: Secondary | ICD-10-CM | POA: Diagnosis not present

## 2022-11-19 DIAGNOSIS — N186 End stage renal disease: Secondary | ICD-10-CM | POA: Diagnosis not present

## 2022-11-19 DIAGNOSIS — E1122 Type 2 diabetes mellitus with diabetic chronic kidney disease: Secondary | ICD-10-CM | POA: Diagnosis not present

## 2022-11-19 DIAGNOSIS — D631 Anemia in chronic kidney disease: Secondary | ICD-10-CM | POA: Diagnosis not present

## 2022-11-19 DIAGNOSIS — Z992 Dependence on renal dialysis: Secondary | ICD-10-CM | POA: Diagnosis not present

## 2022-11-22 DIAGNOSIS — D509 Iron deficiency anemia, unspecified: Secondary | ICD-10-CM | POA: Diagnosis not present

## 2022-11-22 DIAGNOSIS — E1122 Type 2 diabetes mellitus with diabetic chronic kidney disease: Secondary | ICD-10-CM | POA: Diagnosis not present

## 2022-11-22 DIAGNOSIS — D689 Coagulation defect, unspecified: Secondary | ICD-10-CM | POA: Diagnosis not present

## 2022-11-22 DIAGNOSIS — N186 End stage renal disease: Secondary | ICD-10-CM | POA: Diagnosis not present

## 2022-11-22 DIAGNOSIS — N2581 Secondary hyperparathyroidism of renal origin: Secondary | ICD-10-CM | POA: Diagnosis not present

## 2022-11-22 DIAGNOSIS — D631 Anemia in chronic kidney disease: Secondary | ICD-10-CM | POA: Diagnosis not present

## 2022-11-22 DIAGNOSIS — Z992 Dependence on renal dialysis: Secondary | ICD-10-CM | POA: Diagnosis not present

## 2022-11-22 DIAGNOSIS — R519 Headache, unspecified: Secondary | ICD-10-CM | POA: Diagnosis not present

## 2022-11-24 DIAGNOSIS — D689 Coagulation defect, unspecified: Secondary | ICD-10-CM | POA: Diagnosis not present

## 2022-11-24 DIAGNOSIS — N186 End stage renal disease: Secondary | ICD-10-CM | POA: Diagnosis not present

## 2022-11-24 DIAGNOSIS — D509 Iron deficiency anemia, unspecified: Secondary | ICD-10-CM | POA: Diagnosis not present

## 2022-11-24 DIAGNOSIS — Z992 Dependence on renal dialysis: Secondary | ICD-10-CM | POA: Diagnosis not present

## 2022-11-24 DIAGNOSIS — N2581 Secondary hyperparathyroidism of renal origin: Secondary | ICD-10-CM | POA: Diagnosis not present

## 2022-11-24 DIAGNOSIS — D631 Anemia in chronic kidney disease: Secondary | ICD-10-CM | POA: Diagnosis not present

## 2022-11-24 DIAGNOSIS — R519 Headache, unspecified: Secondary | ICD-10-CM | POA: Diagnosis not present

## 2022-11-24 DIAGNOSIS — E1122 Type 2 diabetes mellitus with diabetic chronic kidney disease: Secondary | ICD-10-CM | POA: Diagnosis not present

## 2022-11-26 DIAGNOSIS — D509 Iron deficiency anemia, unspecified: Secondary | ICD-10-CM | POA: Diagnosis not present

## 2022-11-26 DIAGNOSIS — D689 Coagulation defect, unspecified: Secondary | ICD-10-CM | POA: Diagnosis not present

## 2022-11-26 DIAGNOSIS — D631 Anemia in chronic kidney disease: Secondary | ICD-10-CM | POA: Diagnosis not present

## 2022-11-26 DIAGNOSIS — E1122 Type 2 diabetes mellitus with diabetic chronic kidney disease: Secondary | ICD-10-CM | POA: Diagnosis not present

## 2022-11-26 DIAGNOSIS — N2581 Secondary hyperparathyroidism of renal origin: Secondary | ICD-10-CM | POA: Diagnosis not present

## 2022-11-26 DIAGNOSIS — R519 Headache, unspecified: Secondary | ICD-10-CM | POA: Diagnosis not present

## 2022-11-26 DIAGNOSIS — N186 End stage renal disease: Secondary | ICD-10-CM | POA: Diagnosis not present

## 2022-11-26 DIAGNOSIS — Z992 Dependence on renal dialysis: Secondary | ICD-10-CM | POA: Diagnosis not present

## 2022-11-29 DIAGNOSIS — Z992 Dependence on renal dialysis: Secondary | ICD-10-CM | POA: Diagnosis not present

## 2022-11-29 DIAGNOSIS — N2581 Secondary hyperparathyroidism of renal origin: Secondary | ICD-10-CM | POA: Diagnosis not present

## 2022-11-29 DIAGNOSIS — N186 End stage renal disease: Secondary | ICD-10-CM | POA: Diagnosis not present

## 2022-11-29 DIAGNOSIS — E1122 Type 2 diabetes mellitus with diabetic chronic kidney disease: Secondary | ICD-10-CM | POA: Diagnosis not present

## 2022-11-29 DIAGNOSIS — D689 Coagulation defect, unspecified: Secondary | ICD-10-CM | POA: Diagnosis not present

## 2022-11-29 DIAGNOSIS — D631 Anemia in chronic kidney disease: Secondary | ICD-10-CM | POA: Diagnosis not present

## 2022-11-29 DIAGNOSIS — R519 Headache, unspecified: Secondary | ICD-10-CM | POA: Diagnosis not present

## 2022-11-29 DIAGNOSIS — D509 Iron deficiency anemia, unspecified: Secondary | ICD-10-CM | POA: Diagnosis not present

## 2022-12-01 DIAGNOSIS — D689 Coagulation defect, unspecified: Secondary | ICD-10-CM | POA: Diagnosis not present

## 2022-12-01 DIAGNOSIS — R519 Headache, unspecified: Secondary | ICD-10-CM | POA: Diagnosis not present

## 2022-12-01 DIAGNOSIS — N2581 Secondary hyperparathyroidism of renal origin: Secondary | ICD-10-CM | POA: Diagnosis not present

## 2022-12-01 DIAGNOSIS — D631 Anemia in chronic kidney disease: Secondary | ICD-10-CM | POA: Diagnosis not present

## 2022-12-01 DIAGNOSIS — E1122 Type 2 diabetes mellitus with diabetic chronic kidney disease: Secondary | ICD-10-CM | POA: Diagnosis not present

## 2022-12-01 DIAGNOSIS — D509 Iron deficiency anemia, unspecified: Secondary | ICD-10-CM | POA: Diagnosis not present

## 2022-12-01 DIAGNOSIS — Z992 Dependence on renal dialysis: Secondary | ICD-10-CM | POA: Diagnosis not present

## 2022-12-01 DIAGNOSIS — N186 End stage renal disease: Secondary | ICD-10-CM | POA: Diagnosis not present

## 2022-12-03 DIAGNOSIS — N2581 Secondary hyperparathyroidism of renal origin: Secondary | ICD-10-CM | POA: Diagnosis not present

## 2022-12-03 DIAGNOSIS — E1122 Type 2 diabetes mellitus with diabetic chronic kidney disease: Secondary | ICD-10-CM | POA: Diagnosis not present

## 2022-12-03 DIAGNOSIS — D509 Iron deficiency anemia, unspecified: Secondary | ICD-10-CM | POA: Diagnosis not present

## 2022-12-03 DIAGNOSIS — N186 End stage renal disease: Secondary | ICD-10-CM | POA: Diagnosis not present

## 2022-12-03 DIAGNOSIS — R519 Headache, unspecified: Secondary | ICD-10-CM | POA: Diagnosis not present

## 2022-12-03 DIAGNOSIS — Z992 Dependence on renal dialysis: Secondary | ICD-10-CM | POA: Diagnosis not present

## 2022-12-03 DIAGNOSIS — D631 Anemia in chronic kidney disease: Secondary | ICD-10-CM | POA: Diagnosis not present

## 2022-12-03 DIAGNOSIS — D689 Coagulation defect, unspecified: Secondary | ICD-10-CM | POA: Diagnosis not present

## 2022-12-06 DIAGNOSIS — D689 Coagulation defect, unspecified: Secondary | ICD-10-CM | POA: Diagnosis not present

## 2022-12-06 DIAGNOSIS — R519 Headache, unspecified: Secondary | ICD-10-CM | POA: Diagnosis not present

## 2022-12-06 DIAGNOSIS — Z992 Dependence on renal dialysis: Secondary | ICD-10-CM | POA: Diagnosis not present

## 2022-12-06 DIAGNOSIS — D509 Iron deficiency anemia, unspecified: Secondary | ICD-10-CM | POA: Diagnosis not present

## 2022-12-06 DIAGNOSIS — N186 End stage renal disease: Secondary | ICD-10-CM | POA: Diagnosis not present

## 2022-12-06 DIAGNOSIS — N2581 Secondary hyperparathyroidism of renal origin: Secondary | ICD-10-CM | POA: Diagnosis not present

## 2022-12-06 DIAGNOSIS — D631 Anemia in chronic kidney disease: Secondary | ICD-10-CM | POA: Diagnosis not present

## 2022-12-06 DIAGNOSIS — E1122 Type 2 diabetes mellitus with diabetic chronic kidney disease: Secondary | ICD-10-CM | POA: Diagnosis not present

## 2022-12-08 DIAGNOSIS — D509 Iron deficiency anemia, unspecified: Secondary | ICD-10-CM | POA: Diagnosis not present

## 2022-12-08 DIAGNOSIS — R519 Headache, unspecified: Secondary | ICD-10-CM | POA: Diagnosis not present

## 2022-12-08 DIAGNOSIS — E1122 Type 2 diabetes mellitus with diabetic chronic kidney disease: Secondary | ICD-10-CM | POA: Diagnosis not present

## 2022-12-08 DIAGNOSIS — N186 End stage renal disease: Secondary | ICD-10-CM | POA: Diagnosis not present

## 2022-12-08 DIAGNOSIS — Z992 Dependence on renal dialysis: Secondary | ICD-10-CM | POA: Diagnosis not present

## 2022-12-08 DIAGNOSIS — D689 Coagulation defect, unspecified: Secondary | ICD-10-CM | POA: Diagnosis not present

## 2022-12-08 DIAGNOSIS — D631 Anemia in chronic kidney disease: Secondary | ICD-10-CM | POA: Diagnosis not present

## 2022-12-08 DIAGNOSIS — N2581 Secondary hyperparathyroidism of renal origin: Secondary | ICD-10-CM | POA: Diagnosis not present

## 2022-12-10 ENCOUNTER — Emergency Department (HOSPITAL_COMMUNITY)
Admission: EM | Admit: 2022-12-10 | Discharge: 2022-12-10 | Disposition: A | Discharge status: Home / Self Care | Payer: Medicare Other | Attending: Emergency Medicine | Admitting: Emergency Medicine

## 2022-12-10 ENCOUNTER — Encounter (HOSPITAL_COMMUNITY): Payer: Self-pay

## 2022-12-10 ENCOUNTER — Other Ambulatory Visit: Payer: Self-pay

## 2022-12-10 ENCOUNTER — Emergency Department (HOSPITAL_COMMUNITY): Payer: Medicare Other

## 2022-12-10 DIAGNOSIS — G4489 Other headache syndrome: Secondary | ICD-10-CM | POA: Diagnosis not present

## 2022-12-10 DIAGNOSIS — N186 End stage renal disease: Secondary | ICD-10-CM | POA: Diagnosis not present

## 2022-12-10 DIAGNOSIS — R42 Dizziness and giddiness: Secondary | ICD-10-CM | POA: Insufficient documentation

## 2022-12-10 DIAGNOSIS — R112 Nausea with vomiting, unspecified: Secondary | ICD-10-CM | POA: Diagnosis not present

## 2022-12-10 DIAGNOSIS — R519 Headache, unspecified: Secondary | ICD-10-CM | POA: Diagnosis not present

## 2022-12-10 DIAGNOSIS — R6889 Other general symptoms and signs: Secondary | ICD-10-CM | POA: Diagnosis not present

## 2022-12-10 DIAGNOSIS — R1032 Left lower quadrant pain: Secondary | ICD-10-CM | POA: Diagnosis not present

## 2022-12-10 DIAGNOSIS — N2581 Secondary hyperparathyroidism of renal origin: Secondary | ICD-10-CM | POA: Diagnosis not present

## 2022-12-10 DIAGNOSIS — N281 Cyst of kidney, acquired: Secondary | ICD-10-CM | POA: Diagnosis not present

## 2022-12-10 DIAGNOSIS — Z7902 Long term (current) use of antithrombotics/antiplatelets: Secondary | ICD-10-CM | POA: Diagnosis not present

## 2022-12-10 DIAGNOSIS — R531 Weakness: Secondary | ICD-10-CM | POA: Insufficient documentation

## 2022-12-10 DIAGNOSIS — D689 Coagulation defect, unspecified: Secondary | ICD-10-CM | POA: Diagnosis not present

## 2022-12-10 DIAGNOSIS — D509 Iron deficiency anemia, unspecified: Secondary | ICD-10-CM | POA: Diagnosis not present

## 2022-12-10 DIAGNOSIS — Z743 Need for continuous supervision: Secondary | ICD-10-CM | POA: Diagnosis not present

## 2022-12-10 DIAGNOSIS — Z794 Long term (current) use of insulin: Secondary | ICD-10-CM | POA: Diagnosis not present

## 2022-12-10 DIAGNOSIS — E1122 Type 2 diabetes mellitus with diabetic chronic kidney disease: Secondary | ICD-10-CM | POA: Diagnosis not present

## 2022-12-10 DIAGNOSIS — D631 Anemia in chronic kidney disease: Secondary | ICD-10-CM | POA: Diagnosis not present

## 2022-12-10 DIAGNOSIS — Z992 Dependence on renal dialysis: Secondary | ICD-10-CM | POA: Diagnosis not present

## 2022-12-10 LAB — COMPREHENSIVE METABOLIC PANEL
ALT: 9 U/L (ref 0–44)
AST: 19 U/L (ref 15–41)
Albumin: 3.3 g/dL — ABNORMAL LOW (ref 3.5–5.0)
Alkaline Phosphatase: 45 U/L (ref 38–126)
Anion gap: 11 (ref 5–15)
BUN: 11 mg/dL (ref 6–20)
CO2: 30 mmol/L (ref 22–32)
Calcium: 8.3 mg/dL — ABNORMAL LOW (ref 8.9–10.3)
Chloride: 96 mmol/L — ABNORMAL LOW (ref 98–111)
Creatinine, Ser: 3.42 mg/dL — ABNORMAL HIGH (ref 0.44–1.00)
GFR, Estimated: 15 mL/min — ABNORMAL LOW (ref >60)
Glucose, Bld: 123 mg/dL — ABNORMAL HIGH (ref 70–99)
Potassium: 3.9 mmol/L (ref 3.5–5.1)
Sodium: 137 mmol/L (ref 135–145)
Total Bilirubin: 0.7 mg/dL (ref 0.3–1.2)
Total Protein: 6.8 g/dL (ref 6.5–8.1)

## 2022-12-10 LAB — CBC WITH DIFFERENTIAL/PLATELET
Abs Immature Granulocytes: 0.02 10*3/uL (ref 0.00–0.07)
Basophils Absolute: 0 10*3/uL (ref 0.0–0.1)
Basophils Relative: 1 %
Eosinophils Absolute: 0.1 10*3/uL (ref 0.0–0.5)
Eosinophils Relative: 2 %
HCT: 35.5 % — ABNORMAL LOW (ref 36.0–46.0)
Hemoglobin: 11.4 g/dL — ABNORMAL LOW (ref 12.0–15.0)
Immature Granulocytes: 0 %
Lymphocytes Relative: 24 %
Lymphs Abs: 1.3 10*3/uL (ref 0.7–4.0)
MCH: 29.8 pg (ref 26.0–34.0)
MCHC: 32.1 g/dL (ref 30.0–36.0)
MCV: 92.7 fL (ref 80.0–100.0)
Monocytes Absolute: 0.4 10*3/uL (ref 0.1–1.0)
Monocytes Relative: 8 %
Neutro Abs: 3.6 10*3/uL (ref 1.7–7.7)
Neutrophils Relative %: 65 %
Platelets: 105 10*3/uL — ABNORMAL LOW (ref 150–400)
RBC: 3.83 MIL/uL — ABNORMAL LOW (ref 3.87–5.11)
RDW: 13.3 % (ref 11.5–15.5)
WBC: 5.4 10*3/uL (ref 4.0–10.5)
nRBC: 0 % (ref 0.0–0.2)

## 2022-12-10 LAB — LIPASE, BLOOD: Lipase: 30 U/L (ref 11–51)

## 2022-12-10 MED ORDER — SODIUM CHLORIDE 0.9 % IV BOLUS
250.0000 mL | Freq: Once | INTRAVENOUS | Status: AC
  Administered 2022-12-10: 250 mL via INTRAVENOUS

## 2022-12-10 MED ORDER — ONDANSETRON HCL 4 MG/2ML IJ SOLN
4.0000 mg | Freq: Once | INTRAMUSCULAR | Status: AC
  Administered 2022-12-10: 4 mg via INTRAVENOUS
  Filled 2022-12-10: qty 2

## 2022-12-10 NOTE — Discharge Instructions (Signed)
Follow-up with your primary care doctor.  Return to emergency room if you have any worsening symptoms.

## 2022-12-10 NOTE — ED Triage Notes (Signed)
Arrived via EMS from dialysis following full treatment for c/o weakness, dizziness, left frontal headache, and 2 episodes of vomiting. Symptoms onset during dialysis. Hx of stroke with left side deficit and slurred speech. Denies nausea at this time. Given tylenol PTA.    EMS VS 172/89 82 96  Dialysis MWF

## 2022-12-10 NOTE — ED Provider Notes (Signed)
[place] EMERGENCY DEPARTMENT AT [facility] Provider Note   CSN: [id number] Arrival date & time: 12/10/22  1251     History  Chief Complaint  Patient presents with   Weakness    Post dialysis   Dizziness   Headache    Hannah [name] is a 57 y.o. female.  Patient is a 57 year old female who presents with abdominal pain.  She was at her dialysis center receiving dialysis when she had a sharp pain in her left side.  She had a couple episodes of vomiting associated with that.  The pain is eased off but occasionally will come back.  She denies any known fevers.  She says she urinates about twice a day and she has not had any difficulty urinating.  She does have a history of constipation which is unchanged.  She did get some Zofran while she was at dialysis and then had an episode where she felt weak and felt like she might pass out.  They laid her down and she feels better now although still feels a little weak.  She did complete her entire dialysis.       Home Medications Prior to Admission medications   Medication Sig Start Date End Date Taking? Authorizing Provider  acetaminophen (TYLENOL) 325 MG tablet Take 650 mg by mouth every 6 (six) hours as needed for moderate pain.    [provider]  amLODipine (NORVASC) 10 MG tablet Take 1 tablet (10 mg total) by mouth daily. Except on dialysis days do not take a dose. 06/08/22   Rollene Rotunda, MD  calcium acetate (PHOSLO) 667 MG capsule Take 1,334 mg by mouth in the morning, at noon, and at bedtime. 11/11/20   [provider]  carvedilol (COREG) 25 MG tablet Take 1 tablet (25 mg total) by mouth 2 (two) times daily with a meal. And as directed. 06/08/22   Rollene Rotunda, MD  clopidogrel (PLAVIX) 75 MG tablet Take 1 tablet (75 mg total) by mouth daily. 11/03/20   Love, Evlyn Kanner, PA-C  Continuous Blood Gluc Sensor (FREESTYLE LIBRE 2 SENSOR) MISC USE AS DIRECTED CHANGE EVERY 14 DAYS 12/30/20   [provider]   diclofenac Sodium (VOLTAREN) 1 % GEL Apply 2 g topically 4 (four) times daily. To leg Patient taking differently: Apply 2 g topically 4 (four) times daily as needed (pain). To leg 11/03/20   Love, Evlyn Kanner, PA-C  famotidine (PEPCID) 20 MG tablet Take 1 tablet (20 mg total) by mouth daily. 11/03/20   Love, Evlyn Kanner, PA-C  insulin degludec (TRESIBA FLEXTOUCH) 100 UNIT/ML FlexTouch Pen Inject 12 Units into the skin at bedtime. Patient taking differently: Inject 8-12 Units into the skin at bedtime. Sliding scale 11/03/20   Love, Evlyn Kanner, PA-C  insulin lispro (HUMALOG) 100 UNIT/ML KwikPen Inject 5 Units into the skin 3 (three) times daily. 11/04/20   Love, Evlyn Kanner, PA-C  lidocaine-prilocaine (EMLA) cream Apply 1 Application topically daily as needed (port access). 03/02/22   [provider]  melatonin 5 MG TABS Take 5 mg by mouth at bedtime as needed (sleep).    [provider]  metoCLOPramide (REGLAN) 5 MG tablet Take 5 mg by mouth 3 (three) times daily before meals. 09/16/21   [provider]  nitroGLYCERIN (NITROSTAT) 0.4 MG SL tablet Place 1 tablet (0.4 mg total) under the tongue every 5 (five) minutes as needed for chest pain. 06/25/20   Angiulli, Mcarthur Rossetti, PA-C  polyethylene glycol (MIRALAX / GLYCOLAX) 17 g  packet Take 17 g by mouth daily. Patient taking differently: Take 17 g by mouth daily as needed for moderate constipation. 11/04/20   Love, Evlyn Kanner, PA-C  rosuvastatin (CRESTOR) 20 MG tablet Take 20 mg by mouth daily. 02/05/21   [provider]  senna-docusate (SENOKOT-S) 8.6-50 MG tablet Take 1 tablet by mouth 2 (two) times daily as needed for moderate constipation. Patient taking differently: Take 1 tablet by mouth 2 (two) times daily. 11/03/20   Love, Evlyn Kanner, PA-C  traMADol (ULTRAM) 50 MG tablet Take 1 tablet (50 mg total) by mouth every 6 (six) hours as needed. 05/28/22 05/28/23  Schuh, McKenzi P, PA-C      Allergies    Ibuprofen, Influenza vaccines,  Pneumococcal vaccine, Promethazine, Ambien [zolpidem], Codeine, Cyclobenzaprine, Hydrocodone-acetaminophen, Tape, Tramadol hcl, Wound dressing adhesive, and Oxycodone    Review of Systems   Review of Systems  Constitutional:  Positive for fatigue. Negative for chills, diaphoresis and fever.  HENT:  Negative for congestion, rhinorrhea and sneezing.   Eyes: Negative.   Respiratory:  Negative for cough, chest tightness and shortness of breath.   Cardiovascular:  Negative for chest pain and leg swelling.  Gastrointestinal:  Positive for abdominal pain, nausea and vomiting. Negative for blood in stool and diarrhea.  Genitourinary:  Negative for difficulty urinating, flank pain, frequency and hematuria.  Musculoskeletal:  Negative for arthralgias and back pain.  Skin:  Negative for rash.  Neurological:  Positive for light-headedness. Negative for speech difficulty, weakness, numbness and headaches.    Physical Exam Updated Vital Signs BP (!) 198/87   Pulse 82   Temp 98.2 F (36.8 C) (Oral)   Resp 10   Ht 5\' 2"  (1.575 m)   Wt 46.3 kg   SpO2 100%   BMI 18.66 kg/m  Physical Exam Constitutional:      Appearance: She is well-developed.  HENT:     Head: Normocephalic and atraumatic.  Eyes:     Pupils: Pupils are equal, round, and reactive to light.  Cardiovascular:     Rate and Rhythm: Normal rate and regular rhythm.     Heart sounds: Normal heart sounds.  Pulmonary:     Effort: Pulmonary effort is normal. No respiratory distress.     Breath sounds: Normal breath sounds. No wheezing or rales.  Chest:     Chest wall: No tenderness.  Abdominal:     General: Bowel sounds are normal.     Palpations: Abdomen is soft.     Tenderness: There is abdominal tenderness (Tenderness to the left mid and lower abdomen). There is no guarding or rebound.  Musculoskeletal:        General: Normal range of motion.     Cervical back: Normal range of motion and neck supple.  Lymphadenopathy:      Cervical: No cervical adenopathy.  Skin:    General: Skin is warm and dry.     Findings: No rash.  Neurological:     Mental Status: She is alert and oriented to person, place, and time.     ED Results / Procedures / Treatments   Labs (all labs ordered are listed, but only abnormal results are displayed) Labs Reviewed  COMPREHENSIVE METABOLIC PANEL - Abnormal; Notable for the following components:      Result Value   Chloride 96 (*)    Glucose, Bld 123 (*)    Creatinine, Ser 3.42 (*)    Calcium 8.3 (*)    Albumin 3.3 (*)    GFR,  Estimated 15 (*)    All other components within normal limits  CBC WITH DIFFERENTIAL/PLATELET - Abnormal; Notable for the following components:   RBC 3.83 (*)    Hemoglobin 11.4 (*)    HCT 35.5 (*)    Platelets 105 (*)    All other components within normal limits  LIPASE, BLOOD  URINALYSIS, W/ REFLEX TO CULTURE (INFECTION SUSPECTED)    EKG EKG Interpretation Date/Time:  Friday December 10 2022 13:05:38 EDT Ventricular Rate:  87 PR Interval:  174 QRS Duration:  84 QT Interval:  395 QTC Calculation: 476 R Axis:   33  Text Interpretation: Sinus rhythm Ventricular premature complex Probable left atrial enlargement since last tracing no significant change Confirmed by Rolan Bucco [phone]) on 12/10/2022 1:18:48 PM  Radiology CT Renal Stone Study  Result Date: 12/10/2022 CLINICAL DATA:  Weakness, dizziness, nausea, vomiting. EXAM: CT ABDOMEN AND PELVIS WITHOUT CONTRAST TECHNIQUE: Multidetector CT imaging of the abdomen and pelvis was performed following the standard protocol without IV contrast. RADIATION DOSE REDUCTION: This exam was performed according to the departmental dose-optimization program which includes automated exposure control, adjustment of the mA and/or kV according to patient size and/or use of iterative reconstruction technique. COMPARISON:  CT of the pelvis on 10/06/2020. Renal ultrasound of 09/12/2020 FINDINGS: Lower chest: Mild  bibasilar atelectasis/scarring. Small amount of pericardial fluid visualized at the base of the heart. Hepatobiliary: No focal liver abnormality is seen. No gallstones, gallbladder wall thickening, or biliary dilatation. Pancreas: Unremarkable. No pancreatic ductal dilatation or surrounding inflammatory changes. Spleen: Normal in size without focal abnormality. Adrenals/Urinary Tract: No adrenal masses. Atrophic kidneys without hydronephrosis. Simple cyst in the central aspect of the right kidney measures up to 2.8 cm and was seen by prior renal ultrasound. This is consistent with a Bosniak 1 cyst and requires no follow-up. The bladder is unremarkable. Stomach/Bowel: Bowel shows no evidence of obstruction, ileus, inflammation or lesion. The appendix is surgically absent. No free intraperitoneal air. Vascular/Lymphatic: Atherosclerosis of the abdominal aorta without aneurysm. No enlarged lymph nodes identified. Reproductive: Uterus and bilateral adnexa are unremarkable. Other: No abdominal wall hernia or abnormality. No abdominopelvic ascites. Musculoskeletal: No acute or significant osseous findings. IMPRESSION: 1. No acute findings in the abdomen or pelvis. 2. Atrophic kidneys. 3. Small amount of pericardial fluid at the base of the heart. 4. Aortic atherosclerosis. Electronically Signed   By: Irish Lack M.D.   On: 12/10/2022 14:45    Procedures Procedures    Medications Ordered in ED Medications  sodium chloride 0.9 % bolus 250 mL (250 mLs Intravenous New Bag/Given 12/10/22 1450)  ondansetron (ZOFRAN) injection 4 mg (4 mg Intravenous Given 12/10/22 1458)    ED Course/ Medical Decision Making/ A&P                             Medical Decision Making Amount and/or Complexity of Data Reviewed Labs: ordered. Radiology: ordered.  Risk Prescription drug management.   Patient presents with left-sided abdominal pain.  She had a CT scan which showed no acute abnormality.  Her labs reviewed and  are nonconcerning.  No evidence of obstruction.  No evidence of significant constipation.  No evidence of diverticulitis.  She has not been able to get a urine sample although she is not having any urinary symptoms.  She does not have a fever.  Given the sudden onset, would doubt that it is a UTI.  There was any suggestions of kidney stone.  She is feeling 100% better and is actually sitting up eating a sandwich on my recheck.  She has no ongoing abdominal pain.  She was discharged home in good condition.  She was encouraged to follow-up with her PCP.  Return precautions were given.  Final Clinical Impression(s) / ED Diagnoses Final diagnoses:  Left lower quadrant abdominal pain    Rx / DC Orders ED Discharge Orders     None         Rolan Bucco, MD 12/10/22 1642

## 2022-12-13 DIAGNOSIS — D689 Coagulation defect, unspecified: Secondary | ICD-10-CM | POA: Diagnosis not present

## 2022-12-13 DIAGNOSIS — D509 Iron deficiency anemia, unspecified: Secondary | ICD-10-CM | POA: Diagnosis not present

## 2022-12-13 DIAGNOSIS — N2581 Secondary hyperparathyroidism of renal origin: Secondary | ICD-10-CM | POA: Diagnosis not present

## 2022-12-13 DIAGNOSIS — D631 Anemia in chronic kidney disease: Secondary | ICD-10-CM | POA: Diagnosis not present

## 2022-12-13 DIAGNOSIS — R519 Headache, unspecified: Secondary | ICD-10-CM | POA: Diagnosis not present

## 2022-12-13 DIAGNOSIS — E1122 Type 2 diabetes mellitus with diabetic chronic kidney disease: Secondary | ICD-10-CM | POA: Diagnosis not present

## 2022-12-13 DIAGNOSIS — N186 End stage renal disease: Secondary | ICD-10-CM | POA: Diagnosis not present

## 2022-12-13 DIAGNOSIS — Z992 Dependence on renal dialysis: Secondary | ICD-10-CM | POA: Diagnosis not present

## 2022-12-15 DIAGNOSIS — D689 Coagulation defect, unspecified: Secondary | ICD-10-CM | POA: Diagnosis not present

## 2022-12-15 DIAGNOSIS — D509 Iron deficiency anemia, unspecified: Secondary | ICD-10-CM | POA: Diagnosis not present

## 2022-12-15 DIAGNOSIS — N2581 Secondary hyperparathyroidism of renal origin: Secondary | ICD-10-CM | POA: Diagnosis not present

## 2022-12-15 DIAGNOSIS — R519 Headache, unspecified: Secondary | ICD-10-CM | POA: Diagnosis not present

## 2022-12-15 DIAGNOSIS — N186 End stage renal disease: Secondary | ICD-10-CM | POA: Diagnosis not present

## 2022-12-15 DIAGNOSIS — E1022 Type 1 diabetes mellitus with diabetic chronic kidney disease: Secondary | ICD-10-CM | POA: Diagnosis not present

## 2022-12-15 DIAGNOSIS — Z992 Dependence on renal dialysis: Secondary | ICD-10-CM | POA: Diagnosis not present

## 2022-12-15 DIAGNOSIS — D631 Anemia in chronic kidney disease: Secondary | ICD-10-CM | POA: Diagnosis not present

## 2022-12-15 DIAGNOSIS — E1122 Type 2 diabetes mellitus with diabetic chronic kidney disease: Secondary | ICD-10-CM | POA: Diagnosis not present

## 2022-12-17 DIAGNOSIS — D689 Coagulation defect, unspecified: Secondary | ICD-10-CM | POA: Diagnosis not present

## 2022-12-17 DIAGNOSIS — Z992 Dependence on renal dialysis: Secondary | ICD-10-CM | POA: Diagnosis not present

## 2022-12-17 DIAGNOSIS — N2581 Secondary hyperparathyroidism of renal origin: Secondary | ICD-10-CM | POA: Diagnosis not present

## 2022-12-17 DIAGNOSIS — N186 End stage renal disease: Secondary | ICD-10-CM | POA: Diagnosis not present

## 2022-12-17 DIAGNOSIS — D509 Iron deficiency anemia, unspecified: Secondary | ICD-10-CM | POA: Diagnosis not present

## 2022-12-17 DIAGNOSIS — E1122 Type 2 diabetes mellitus with diabetic chronic kidney disease: Secondary | ICD-10-CM | POA: Diagnosis not present

## 2022-12-17 DIAGNOSIS — D631 Anemia in chronic kidney disease: Secondary | ICD-10-CM | POA: Diagnosis not present

## 2022-12-17 DIAGNOSIS — R519 Headache, unspecified: Secondary | ICD-10-CM | POA: Diagnosis not present

## 2022-12-20 DIAGNOSIS — D509 Iron deficiency anemia, unspecified: Secondary | ICD-10-CM | POA: Diagnosis not present

## 2022-12-20 DIAGNOSIS — R519 Headache, unspecified: Secondary | ICD-10-CM | POA: Diagnosis not present

## 2022-12-20 DIAGNOSIS — D689 Coagulation defect, unspecified: Secondary | ICD-10-CM | POA: Diagnosis not present

## 2022-12-20 DIAGNOSIS — E1122 Type 2 diabetes mellitus with diabetic chronic kidney disease: Secondary | ICD-10-CM | POA: Diagnosis not present

## 2022-12-20 DIAGNOSIS — N2581 Secondary hyperparathyroidism of renal origin: Secondary | ICD-10-CM | POA: Diagnosis not present

## 2022-12-20 DIAGNOSIS — N186 End stage renal disease: Secondary | ICD-10-CM | POA: Diagnosis not present

## 2022-12-20 DIAGNOSIS — D631 Anemia in chronic kidney disease: Secondary | ICD-10-CM | POA: Diagnosis not present

## 2022-12-20 DIAGNOSIS — Z992 Dependence on renal dialysis: Secondary | ICD-10-CM | POA: Diagnosis not present

## 2022-12-22 ENCOUNTER — Telehealth: Payer: Self-pay | Admitting: Family Medicine

## 2022-12-22 DIAGNOSIS — N186 End stage renal disease: Secondary | ICD-10-CM | POA: Diagnosis not present

## 2022-12-22 DIAGNOSIS — Z992 Dependence on renal dialysis: Secondary | ICD-10-CM | POA: Diagnosis not present

## 2022-12-22 DIAGNOSIS — D509 Iron deficiency anemia, unspecified: Secondary | ICD-10-CM | POA: Diagnosis not present

## 2022-12-22 DIAGNOSIS — N2581 Secondary hyperparathyroidism of renal origin: Secondary | ICD-10-CM | POA: Diagnosis not present

## 2022-12-22 DIAGNOSIS — D631 Anemia in chronic kidney disease: Secondary | ICD-10-CM | POA: Diagnosis not present

## 2022-12-22 DIAGNOSIS — E1122 Type 2 diabetes mellitus with diabetic chronic kidney disease: Secondary | ICD-10-CM | POA: Diagnosis not present

## 2022-12-22 DIAGNOSIS — D689 Coagulation defect, unspecified: Secondary | ICD-10-CM | POA: Diagnosis not present

## 2022-12-22 DIAGNOSIS — R519 Headache, unspecified: Secondary | ICD-10-CM | POA: Diagnosis not present

## 2022-12-22 NOTE — Telephone Encounter (Signed)
LVM and sent mychart msg informing pt of need to reschedule 04/13/23 appt - NP out

## 2022-12-24 DIAGNOSIS — R519 Headache, unspecified: Secondary | ICD-10-CM | POA: Diagnosis not present

## 2022-12-24 DIAGNOSIS — N186 End stage renal disease: Secondary | ICD-10-CM | POA: Diagnosis not present

## 2022-12-24 DIAGNOSIS — D631 Anemia in chronic kidney disease: Secondary | ICD-10-CM | POA: Diagnosis not present

## 2022-12-24 DIAGNOSIS — E1122 Type 2 diabetes mellitus with diabetic chronic kidney disease: Secondary | ICD-10-CM | POA: Diagnosis not present

## 2022-12-24 DIAGNOSIS — D509 Iron deficiency anemia, unspecified: Secondary | ICD-10-CM | POA: Diagnosis not present

## 2022-12-24 DIAGNOSIS — D689 Coagulation defect, unspecified: Secondary | ICD-10-CM | POA: Diagnosis not present

## 2022-12-24 DIAGNOSIS — N2581 Secondary hyperparathyroidism of renal origin: Secondary | ICD-10-CM | POA: Diagnosis not present

## 2022-12-24 DIAGNOSIS — Z992 Dependence on renal dialysis: Secondary | ICD-10-CM | POA: Diagnosis not present

## 2022-12-27 DIAGNOSIS — R519 Headache, unspecified: Secondary | ICD-10-CM | POA: Diagnosis not present

## 2022-12-27 DIAGNOSIS — D689 Coagulation defect, unspecified: Secondary | ICD-10-CM | POA: Diagnosis not present

## 2022-12-27 DIAGNOSIS — E1122 Type 2 diabetes mellitus with diabetic chronic kidney disease: Secondary | ICD-10-CM | POA: Diagnosis not present

## 2022-12-27 DIAGNOSIS — D509 Iron deficiency anemia, unspecified: Secondary | ICD-10-CM | POA: Diagnosis not present

## 2022-12-27 DIAGNOSIS — Z992 Dependence on renal dialysis: Secondary | ICD-10-CM | POA: Diagnosis not present

## 2022-12-27 DIAGNOSIS — D631 Anemia in chronic kidney disease: Secondary | ICD-10-CM | POA: Diagnosis not present

## 2022-12-27 DIAGNOSIS — N2581 Secondary hyperparathyroidism of renal origin: Secondary | ICD-10-CM | POA: Diagnosis not present

## 2022-12-27 DIAGNOSIS — N186 End stage renal disease: Secondary | ICD-10-CM | POA: Diagnosis not present

## 2022-12-29 DIAGNOSIS — N2581 Secondary hyperparathyroidism of renal origin: Secondary | ICD-10-CM | POA: Diagnosis not present

## 2022-12-29 DIAGNOSIS — R519 Headache, unspecified: Secondary | ICD-10-CM | POA: Diagnosis not present

## 2022-12-29 DIAGNOSIS — D509 Iron deficiency anemia, unspecified: Secondary | ICD-10-CM | POA: Diagnosis not present

## 2022-12-29 DIAGNOSIS — D631 Anemia in chronic kidney disease: Secondary | ICD-10-CM | POA: Diagnosis not present

## 2022-12-29 DIAGNOSIS — D689 Coagulation defect, unspecified: Secondary | ICD-10-CM | POA: Diagnosis not present

## 2022-12-29 DIAGNOSIS — N186 End stage renal disease: Secondary | ICD-10-CM | POA: Diagnosis not present

## 2022-12-29 DIAGNOSIS — Z992 Dependence on renal dialysis: Secondary | ICD-10-CM | POA: Diagnosis not present

## 2022-12-29 DIAGNOSIS — E1122 Type 2 diabetes mellitus with diabetic chronic kidney disease: Secondary | ICD-10-CM | POA: Diagnosis not present

## 2022-12-31 DIAGNOSIS — Z992 Dependence on renal dialysis: Secondary | ICD-10-CM | POA: Diagnosis not present

## 2022-12-31 DIAGNOSIS — D631 Anemia in chronic kidney disease: Secondary | ICD-10-CM | POA: Diagnosis not present

## 2022-12-31 DIAGNOSIS — N2581 Secondary hyperparathyroidism of renal origin: Secondary | ICD-10-CM | POA: Diagnosis not present

## 2022-12-31 DIAGNOSIS — N186 End stage renal disease: Secondary | ICD-10-CM | POA: Diagnosis not present

## 2022-12-31 DIAGNOSIS — R519 Headache, unspecified: Secondary | ICD-10-CM | POA: Diagnosis not present

## 2022-12-31 DIAGNOSIS — E1122 Type 2 diabetes mellitus with diabetic chronic kidney disease: Secondary | ICD-10-CM | POA: Diagnosis not present

## 2022-12-31 DIAGNOSIS — D509 Iron deficiency anemia, unspecified: Secondary | ICD-10-CM | POA: Diagnosis not present

## 2022-12-31 DIAGNOSIS — D689 Coagulation defect, unspecified: Secondary | ICD-10-CM | POA: Diagnosis not present

## 2023-01-03 DIAGNOSIS — Z992 Dependence on renal dialysis: Secondary | ICD-10-CM | POA: Diagnosis not present

## 2023-01-03 DIAGNOSIS — R519 Headache, unspecified: Secondary | ICD-10-CM | POA: Diagnosis not present

## 2023-01-03 DIAGNOSIS — D689 Coagulation defect, unspecified: Secondary | ICD-10-CM | POA: Diagnosis not present

## 2023-01-03 DIAGNOSIS — D509 Iron deficiency anemia, unspecified: Secondary | ICD-10-CM | POA: Diagnosis not present

## 2023-01-03 DIAGNOSIS — E1122 Type 2 diabetes mellitus with diabetic chronic kidney disease: Secondary | ICD-10-CM | POA: Diagnosis not present

## 2023-01-03 DIAGNOSIS — N2581 Secondary hyperparathyroidism of renal origin: Secondary | ICD-10-CM | POA: Diagnosis not present

## 2023-01-03 DIAGNOSIS — D631 Anemia in chronic kidney disease: Secondary | ICD-10-CM | POA: Diagnosis not present

## 2023-01-03 DIAGNOSIS — N186 End stage renal disease: Secondary | ICD-10-CM | POA: Diagnosis not present

## 2023-01-07 DIAGNOSIS — D689 Coagulation defect, unspecified: Secondary | ICD-10-CM | POA: Diagnosis not present

## 2023-01-07 DIAGNOSIS — E1122 Type 2 diabetes mellitus with diabetic chronic kidney disease: Secondary | ICD-10-CM | POA: Diagnosis not present

## 2023-01-07 DIAGNOSIS — R519 Headache, unspecified: Secondary | ICD-10-CM | POA: Diagnosis not present

## 2023-01-07 DIAGNOSIS — D631 Anemia in chronic kidney disease: Secondary | ICD-10-CM | POA: Diagnosis not present

## 2023-01-07 DIAGNOSIS — D509 Iron deficiency anemia, unspecified: Secondary | ICD-10-CM | POA: Diagnosis not present

## 2023-01-07 DIAGNOSIS — Z992 Dependence on renal dialysis: Secondary | ICD-10-CM | POA: Diagnosis not present

## 2023-01-07 DIAGNOSIS — N2581 Secondary hyperparathyroidism of renal origin: Secondary | ICD-10-CM | POA: Diagnosis not present

## 2023-01-07 DIAGNOSIS — N186 End stage renal disease: Secondary | ICD-10-CM | POA: Diagnosis not present

## 2023-01-10 DIAGNOSIS — R519 Headache, unspecified: Secondary | ICD-10-CM | POA: Diagnosis not present

## 2023-01-10 DIAGNOSIS — D689 Coagulation defect, unspecified: Secondary | ICD-10-CM | POA: Diagnosis not present

## 2023-01-10 DIAGNOSIS — E1122 Type 2 diabetes mellitus with diabetic chronic kidney disease: Secondary | ICD-10-CM | POA: Diagnosis not present

## 2023-01-10 DIAGNOSIS — N2581 Secondary hyperparathyroidism of renal origin: Secondary | ICD-10-CM | POA: Diagnosis not present

## 2023-01-10 DIAGNOSIS — D509 Iron deficiency anemia, unspecified: Secondary | ICD-10-CM | POA: Diagnosis not present

## 2023-01-10 DIAGNOSIS — D631 Anemia in chronic kidney disease: Secondary | ICD-10-CM | POA: Diagnosis not present

## 2023-01-10 DIAGNOSIS — N186 End stage renal disease: Secondary | ICD-10-CM | POA: Diagnosis not present

## 2023-01-10 DIAGNOSIS — Z992 Dependence on renal dialysis: Secondary | ICD-10-CM | POA: Diagnosis not present

## 2023-01-12 DIAGNOSIS — R519 Headache, unspecified: Secondary | ICD-10-CM | POA: Diagnosis not present

## 2023-01-12 DIAGNOSIS — N186 End stage renal disease: Secondary | ICD-10-CM | POA: Diagnosis not present

## 2023-01-12 DIAGNOSIS — D509 Iron deficiency anemia, unspecified: Secondary | ICD-10-CM | POA: Diagnosis not present

## 2023-01-12 DIAGNOSIS — D689 Coagulation defect, unspecified: Secondary | ICD-10-CM | POA: Diagnosis not present

## 2023-01-12 DIAGNOSIS — D631 Anemia in chronic kidney disease: Secondary | ICD-10-CM | POA: Diagnosis not present

## 2023-01-12 DIAGNOSIS — Z992 Dependence on renal dialysis: Secondary | ICD-10-CM | POA: Diagnosis not present

## 2023-01-12 DIAGNOSIS — N2581 Secondary hyperparathyroidism of renal origin: Secondary | ICD-10-CM | POA: Diagnosis not present

## 2023-01-12 DIAGNOSIS — E1122 Type 2 diabetes mellitus with diabetic chronic kidney disease: Secondary | ICD-10-CM | POA: Diagnosis not present

## 2023-01-13 DIAGNOSIS — E119 Type 2 diabetes mellitus without complications: Secondary | ICD-10-CM | POA: Diagnosis not present

## 2023-01-14 DIAGNOSIS — D509 Iron deficiency anemia, unspecified: Secondary | ICD-10-CM | POA: Diagnosis not present

## 2023-01-14 DIAGNOSIS — N2581 Secondary hyperparathyroidism of renal origin: Secondary | ICD-10-CM | POA: Diagnosis not present

## 2023-01-14 DIAGNOSIS — Z992 Dependence on renal dialysis: Secondary | ICD-10-CM | POA: Diagnosis not present

## 2023-01-14 DIAGNOSIS — R519 Headache, unspecified: Secondary | ICD-10-CM | POA: Diagnosis not present

## 2023-01-14 DIAGNOSIS — D689 Coagulation defect, unspecified: Secondary | ICD-10-CM | POA: Diagnosis not present

## 2023-01-14 DIAGNOSIS — E1122 Type 2 diabetes mellitus with diabetic chronic kidney disease: Secondary | ICD-10-CM | POA: Diagnosis not present

## 2023-01-14 DIAGNOSIS — N186 End stage renal disease: Secondary | ICD-10-CM | POA: Diagnosis not present

## 2023-01-14 DIAGNOSIS — D631 Anemia in chronic kidney disease: Secondary | ICD-10-CM | POA: Diagnosis not present

## 2023-01-15 DIAGNOSIS — E1022 Type 1 diabetes mellitus with diabetic chronic kidney disease: Secondary | ICD-10-CM | POA: Diagnosis not present

## 2023-01-15 DIAGNOSIS — N186 End stage renal disease: Secondary | ICD-10-CM | POA: Diagnosis not present

## 2023-01-15 DIAGNOSIS — Z992 Dependence on renal dialysis: Secondary | ICD-10-CM | POA: Diagnosis not present

## 2023-01-17 DIAGNOSIS — N2581 Secondary hyperparathyroidism of renal origin: Secondary | ICD-10-CM | POA: Diagnosis not present

## 2023-01-17 DIAGNOSIS — D631 Anemia in chronic kidney disease: Secondary | ICD-10-CM | POA: Diagnosis not present

## 2023-01-17 DIAGNOSIS — Z992 Dependence on renal dialysis: Secondary | ICD-10-CM | POA: Diagnosis not present

## 2023-01-17 DIAGNOSIS — D689 Coagulation defect, unspecified: Secondary | ICD-10-CM | POA: Diagnosis not present

## 2023-01-17 DIAGNOSIS — N186 End stage renal disease: Secondary | ICD-10-CM | POA: Diagnosis not present

## 2023-01-17 DIAGNOSIS — A499 Bacterial infection, unspecified: Secondary | ICD-10-CM | POA: Diagnosis not present

## 2023-01-17 DIAGNOSIS — D509 Iron deficiency anemia, unspecified: Secondary | ICD-10-CM | POA: Diagnosis not present

## 2023-01-17 DIAGNOSIS — E1122 Type 2 diabetes mellitus with diabetic chronic kidney disease: Secondary | ICD-10-CM | POA: Diagnosis not present

## 2023-01-19 DIAGNOSIS — N2581 Secondary hyperparathyroidism of renal origin: Secondary | ICD-10-CM | POA: Diagnosis not present

## 2023-01-19 DIAGNOSIS — D689 Coagulation defect, unspecified: Secondary | ICD-10-CM | POA: Diagnosis not present

## 2023-01-19 DIAGNOSIS — Z992 Dependence on renal dialysis: Secondary | ICD-10-CM | POA: Diagnosis not present

## 2023-01-19 DIAGNOSIS — E1122 Type 2 diabetes mellitus with diabetic chronic kidney disease: Secondary | ICD-10-CM | POA: Diagnosis not present

## 2023-01-19 DIAGNOSIS — A499 Bacterial infection, unspecified: Secondary | ICD-10-CM | POA: Diagnosis not present

## 2023-01-19 DIAGNOSIS — D509 Iron deficiency anemia, unspecified: Secondary | ICD-10-CM | POA: Diagnosis not present

## 2023-01-19 DIAGNOSIS — D631 Anemia in chronic kidney disease: Secondary | ICD-10-CM | POA: Diagnosis not present

## 2023-01-19 DIAGNOSIS — N186 End stage renal disease: Secondary | ICD-10-CM | POA: Diagnosis not present

## 2023-01-21 DIAGNOSIS — A499 Bacterial infection, unspecified: Secondary | ICD-10-CM | POA: Diagnosis not present

## 2023-01-21 DIAGNOSIS — N186 End stage renal disease: Secondary | ICD-10-CM | POA: Diagnosis not present

## 2023-01-21 DIAGNOSIS — D509 Iron deficiency anemia, unspecified: Secondary | ICD-10-CM | POA: Diagnosis not present

## 2023-01-21 DIAGNOSIS — Z992 Dependence on renal dialysis: Secondary | ICD-10-CM | POA: Diagnosis not present

## 2023-01-21 DIAGNOSIS — D631 Anemia in chronic kidney disease: Secondary | ICD-10-CM | POA: Diagnosis not present

## 2023-01-21 DIAGNOSIS — N2581 Secondary hyperparathyroidism of renal origin: Secondary | ICD-10-CM | POA: Diagnosis not present

## 2023-01-21 DIAGNOSIS — E1122 Type 2 diabetes mellitus with diabetic chronic kidney disease: Secondary | ICD-10-CM | POA: Diagnosis not present

## 2023-01-21 DIAGNOSIS — D689 Coagulation defect, unspecified: Secondary | ICD-10-CM | POA: Diagnosis not present

## 2023-01-24 DIAGNOSIS — A499 Bacterial infection, unspecified: Secondary | ICD-10-CM | POA: Diagnosis not present

## 2023-01-24 DIAGNOSIS — D509 Iron deficiency anemia, unspecified: Secondary | ICD-10-CM | POA: Diagnosis not present

## 2023-01-24 DIAGNOSIS — Z992 Dependence on renal dialysis: Secondary | ICD-10-CM | POA: Diagnosis not present

## 2023-01-24 DIAGNOSIS — E1122 Type 2 diabetes mellitus with diabetic chronic kidney disease: Secondary | ICD-10-CM | POA: Diagnosis not present

## 2023-01-24 DIAGNOSIS — N186 End stage renal disease: Secondary | ICD-10-CM | POA: Diagnosis not present

## 2023-01-24 DIAGNOSIS — N2581 Secondary hyperparathyroidism of renal origin: Secondary | ICD-10-CM | POA: Diagnosis not present

## 2023-01-24 DIAGNOSIS — D689 Coagulation defect, unspecified: Secondary | ICD-10-CM | POA: Diagnosis not present

## 2023-01-24 DIAGNOSIS — D631 Anemia in chronic kidney disease: Secondary | ICD-10-CM | POA: Diagnosis not present

## 2023-01-26 DIAGNOSIS — N2581 Secondary hyperparathyroidism of renal origin: Secondary | ICD-10-CM | POA: Diagnosis not present

## 2023-01-26 DIAGNOSIS — A499 Bacterial infection, unspecified: Secondary | ICD-10-CM | POA: Diagnosis not present

## 2023-01-26 DIAGNOSIS — N186 End stage renal disease: Secondary | ICD-10-CM | POA: Diagnosis not present

## 2023-01-26 DIAGNOSIS — D689 Coagulation defect, unspecified: Secondary | ICD-10-CM | POA: Diagnosis not present

## 2023-01-26 DIAGNOSIS — D631 Anemia in chronic kidney disease: Secondary | ICD-10-CM | POA: Diagnosis not present

## 2023-01-26 DIAGNOSIS — E1122 Type 2 diabetes mellitus with diabetic chronic kidney disease: Secondary | ICD-10-CM | POA: Diagnosis not present

## 2023-01-26 DIAGNOSIS — D509 Iron deficiency anemia, unspecified: Secondary | ICD-10-CM | POA: Diagnosis not present

## 2023-01-26 DIAGNOSIS — Z992 Dependence on renal dialysis: Secondary | ICD-10-CM | POA: Diagnosis not present

## 2023-01-28 ENCOUNTER — Emergency Department (HOSPITAL_COMMUNITY)
Admission: EM | Admit: 2023-01-28 | Discharge: 2023-01-28 | Disposition: A | Discharge status: Home / Self Care | Payer: Medicare Other | Attending: Emergency Medicine | Admitting: Emergency Medicine

## 2023-01-28 ENCOUNTER — Encounter (HOSPITAL_COMMUNITY): Payer: Self-pay

## 2023-01-28 ENCOUNTER — Other Ambulatory Visit: Payer: Self-pay

## 2023-01-28 DIAGNOSIS — Z794 Long term (current) use of insulin: Secondary | ICD-10-CM | POA: Diagnosis not present

## 2023-01-28 DIAGNOSIS — N186 End stage renal disease: Secondary | ICD-10-CM | POA: Insufficient documentation

## 2023-01-28 DIAGNOSIS — D689 Coagulation defect, unspecified: Secondary | ICD-10-CM | POA: Diagnosis not present

## 2023-01-28 DIAGNOSIS — N2581 Secondary hyperparathyroidism of renal origin: Secondary | ICD-10-CM | POA: Diagnosis not present

## 2023-01-28 DIAGNOSIS — Z743 Need for continuous supervision: Secondary | ICD-10-CM | POA: Diagnosis not present

## 2023-01-28 DIAGNOSIS — E1122 Type 2 diabetes mellitus with diabetic chronic kidney disease: Secondary | ICD-10-CM | POA: Diagnosis not present

## 2023-01-28 DIAGNOSIS — Z7902 Long term (current) use of antithrombotics/antiplatelets: Secondary | ICD-10-CM | POA: Diagnosis not present

## 2023-01-28 DIAGNOSIS — Z992 Dependence on renal dialysis: Secondary | ICD-10-CM | POA: Insufficient documentation

## 2023-01-28 DIAGNOSIS — D631 Anemia in chronic kidney disease: Secondary | ICD-10-CM | POA: Diagnosis not present

## 2023-01-28 DIAGNOSIS — A499 Bacterial infection, unspecified: Secondary | ICD-10-CM | POA: Diagnosis not present

## 2023-01-28 DIAGNOSIS — R5383 Other fatigue: Secondary | ICD-10-CM | POA: Diagnosis not present

## 2023-01-28 DIAGNOSIS — R6889 Other general symptoms and signs: Secondary | ICD-10-CM | POA: Diagnosis not present

## 2023-01-28 DIAGNOSIS — R531 Weakness: Secondary | ICD-10-CM | POA: Diagnosis not present

## 2023-01-28 DIAGNOSIS — D509 Iron deficiency anemia, unspecified: Secondary | ICD-10-CM | POA: Diagnosis not present

## 2023-01-28 DIAGNOSIS — Z0000 Encounter for general adult medical examination without abnormal findings: Secondary | ICD-10-CM | POA: Diagnosis not present

## 2023-01-28 LAB — CBC WITH DIFFERENTIAL/PLATELET
Abs Immature Granulocytes: 0.02 10*3/uL (ref 0.00–0.07)
Basophils Absolute: 0 10*3/uL (ref 0.0–0.1)
Basophils Relative: 1 %
Eosinophils Absolute: 0.1 10*3/uL (ref 0.0–0.5)
Eosinophils Relative: 2 %
HCT: 28.6 % — ABNORMAL LOW (ref 36.0–46.0)
Hemoglobin: 9.3 g/dL — ABNORMAL LOW (ref 12.0–15.0)
Immature Granulocytes: 0 %
Lymphocytes Relative: 14 %
Lymphs Abs: 0.9 10*3/uL (ref 0.7–4.0)
MCH: 29.8 pg (ref 26.0–34.0)
MCHC: 32.5 g/dL (ref 30.0–36.0)
MCV: 91.7 fL (ref 80.0–100.0)
Monocytes Absolute: 0.6 10*3/uL (ref 0.1–1.0)
Monocytes Relative: 9 %
Neutro Abs: 5 10*3/uL (ref 1.7–7.7)
Neutrophils Relative %: 74 %
Platelets: 116 10*3/uL — ABNORMAL LOW (ref 150–400)
RBC: 3.12 MIL/uL — ABNORMAL LOW (ref 3.87–5.11)
RDW: 12.8 % (ref 11.5–15.5)
WBC: 6.7 10*3/uL (ref 4.0–10.5)
nRBC: 0 % (ref 0.0–0.2)

## 2023-01-28 LAB — BASIC METABOLIC PANEL
Anion gap: 6 (ref 5–15)
BUN: 18 mg/dL (ref 6–20)
CO2: 33 mmol/L — ABNORMAL HIGH (ref 22–32)
Calcium: 8.3 mg/dL — ABNORMAL LOW (ref 8.9–10.3)
Chloride: 98 mmol/L (ref 98–111)
Creatinine, Ser: 3.64 mg/dL — ABNORMAL HIGH (ref 0.44–1.00)
GFR, Estimated: 14 mL/min — ABNORMAL LOW (ref >60)
Glucose, Bld: 185 mg/dL — ABNORMAL HIGH (ref 70–99)
Potassium: 4.1 mmol/L (ref 3.5–5.1)
Sodium: 137 mmol/L (ref 135–145)

## 2023-01-28 NOTE — ED Provider Notes (Signed)
[place] EMERGENCY DEPARTMENT AT [facility] Provider Note   CSN: [id number] Arrival date & time: 01/28/23  1019     History  Chief Complaint  Patient presents with   Hannah [name] is a 57 y.o. female.  HPI   57 year old female who is end-stage renal disease on hemodialysis presents emergency after an episode of lethargy.  Patient was at dialysis when the staff felt like she had increased lethargy.  Patient states that this sometimes happens during her dialysis sessions and that she falls asleep.  She feels like they take off too much volume too quickly.  She is addressing this with her nephrologist.  Otherwise she is now awake, feels completely baseline and has no acute complaints.  States she is otherwise been in her usual state of health.  Endorses some mild fatigue but no other acute complaints.  Home Medications Prior to Admission medications   Medication Sig Start Date End Date Taking? Authorizing Provider  acetaminophen (TYLENOL) 325 MG tablet Take 650 mg by mouth every 6 (six) hours as needed for moderate pain.    [provider]  amLODipine (NORVASC) 10 MG tablet Take 1 tablet (10 mg total) by mouth daily. Except on dialysis days do not take a dose. 06/08/22   Rollene Rotunda, MD  calcium acetate (PHOSLO) 667 MG capsule Take 1,334 mg by mouth in the morning, at noon, and at bedtime. 11/11/20   [provider]  carvedilol (COREG) 25 MG tablet Take 1 tablet (25 mg total) by mouth 2 (two) times daily with a meal. And as directed. 06/08/22   Rollene Rotunda, MD  clopidogrel (PLAVIX) 75 MG tablet Take 1 tablet (75 mg total) by mouth daily. 11/03/20   Love, Evlyn Kanner, PA-C  Continuous Blood Gluc Sensor (FREESTYLE LIBRE 2 SENSOR) MISC USE AS DIRECTED CHANGE EVERY 14 DAYS 12/30/20   [provider]  diclofenac Sodium (VOLTAREN) 1 % GEL Apply 2 g topically 4 (four) times daily. To leg Patient taking differently: Apply 2 g topically 4 (four)  times daily as needed (pain). To leg 11/03/20   Love, Evlyn Kanner, PA-C  famotidine (PEPCID) 20 MG tablet Take 1 tablet (20 mg total) by mouth daily. 11/03/20   Love, Evlyn Kanner, PA-C  insulin degludec (TRESIBA FLEXTOUCH) 100 UNIT/ML FlexTouch Pen Inject 12 Units into the skin at bedtime. Patient taking differently: Inject 8-12 Units into the skin at bedtime. Sliding scale 11/03/20   Love, Evlyn Kanner, PA-C  insulin lispro (HUMALOG) 100 UNIT/ML KwikPen Inject 5 Units into the skin 3 (three) times daily. 11/04/20   Love, Evlyn Kanner, PA-C  lidocaine-prilocaine (EMLA) cream Apply 1 Application topically daily as needed (port access). 03/02/22   [provider]  melatonin 5 MG TABS Take 5 mg by mouth at bedtime as needed (sleep).    [provider]  metoCLOPramide (REGLAN) 5 MG tablet Take 5 mg by mouth 3 (three) times daily before meals. 09/16/21   [provider]  nitroGLYCERIN (NITROSTAT) 0.4 MG SL tablet Place 1 tablet (0.4 mg total) under the tongue every 5 (five) minutes as needed for chest pain. 06/25/20   Angiulli, Mcarthur Rossetti, PA-C  polyethylene glycol (MIRALAX / GLYCOLAX) 17 g packet Take 17 g by mouth daily. Patient taking differently: Take 17 g by mouth daily as needed for moderate constipation. 11/04/20   Love, Evlyn Kanner, PA-C  rosuvastatin (CRESTOR) 20 MG tablet Take 20 mg by mouth daily. 02/05/21   [provider]  senna-docusate (SENOKOT-S) 8.6-50 MG tablet Take 1 tablet by mouth 2 (two) times daily as needed for moderate constipation. Patient taking differently: Take 1 tablet by mouth 2 (two) times daily. 11/03/20   Love, Evlyn Kanner, PA-C  traMADol (ULTRAM) 50 MG tablet Take 1 tablet (50 mg total) by mouth every 6 (six) hours as needed. 05/28/22 05/28/23  Schuh, McKenzi P, PA-C      Allergies    Ibuprofen, Influenza vaccines, Pneumococcal vaccine, Promethazine, Ambien [zolpidem], Codeine, Cyclobenzaprine, Hydrocodone-acetaminophen, Tape, Tramadol hcl, Wound dressing adhesive,  and Oxycodone    Review of Systems   Review of Systems  Constitutional:  Positive for fatigue. Negative for fever.  Respiratory:  Negative for shortness of breath.   Cardiovascular:  Negative for chest pain.  Gastrointestinal:  Negative for abdominal pain, diarrhea and vomiting.  Skin:  Negative for rash.  Neurological:  Negative for facial asymmetry, weakness, numbness and headaches.    Physical Exam Updated Vital Signs BP (!) 158/66   Pulse 85   Temp 98.4 F (36.9 C) (Oral)   Resp 14   Ht 5\' 2"  (1.575 m)   Wt 47 kg   SpO2 99%   BMI 18.95 kg/m  Physical Exam Vitals and nursing note reviewed.  Constitutional:      General: She is not in acute distress.    Appearance: Normal appearance.  HENT:     Head: Normocephalic.     Mouth/Throat:     Mouth: Mucous membranes are moist.  Cardiovascular:     Rate and Rhythm: Normal rate.  Pulmonary:     Effort: Pulmonary effort is normal. No respiratory distress.  Abdominal:     Palpations: Abdomen is soft.     Tenderness: There is no abdominal tenderness.  Skin:    General: Skin is warm.  Neurological:     Mental Status: She is alert and oriented to person, place, and time. Mental status is at baseline.  Psychiatric:        Mood and Affect: Mood normal.     ED Results / Procedures / Treatments   Labs (all labs ordered are listed, but only abnormal results are displayed) Labs Reviewed  CBC WITH DIFFERENTIAL/PLATELET  BASIC METABOLIC PANEL    EKG None  Radiology No results found.  Procedures Procedures    Medications Ordered in ED Medications - No data to display  ED Course/ Medical Decision Making/ A&P                                 Medical Decision Making Amount and/or Complexity of Data Reviewed Labs: ordered.   57 year old female presented to the emergency department with report that she was more lethargic than normal during her dialysis session.  Since waking up she is back to normal, has no  complaints, has baseline deficits but no new acute changes.  Vitals are normal and stable for the patient.  Blood work is baseline for the patient.  She otherwise feels well and has been monitored here with no further episodes or other complaints.  Will plan for outpatient follow-up.  Patient at this time appears safe and stable for discharge and close outpatient follow up. Discharge plan and strict return to ED precautions discussed, patient verbalizes understanding and agreement.        Final Clinical Impression(s) / ED Diagnoses Final diagnoses:  None    Rx / DC Orders ED Discharge Orders  None         Rozelle Logan, DO 01/28/23 1553

## 2023-01-28 NOTE — ED Triage Notes (Signed)
Pt BIB [facility] from dialysis with them having concerns for increased lethargy while there. VSS, does have baseline Rt sided deficits from past stroke, A/Ox4, 140 SPB/palp. CBG 209, 95.7 Temp, 88 bpm, 98% RA.

## 2023-01-29 DIAGNOSIS — Z0000 Encounter for general adult medical examination without abnormal findings: Secondary | ICD-10-CM | POA: Diagnosis not present

## 2023-01-31 DIAGNOSIS — N186 End stage renal disease: Secondary | ICD-10-CM | POA: Diagnosis not present

## 2023-01-31 DIAGNOSIS — D509 Iron deficiency anemia, unspecified: Secondary | ICD-10-CM | POA: Diagnosis not present

## 2023-01-31 DIAGNOSIS — D689 Coagulation defect, unspecified: Secondary | ICD-10-CM | POA: Diagnosis not present

## 2023-01-31 DIAGNOSIS — A499 Bacterial infection, unspecified: Secondary | ICD-10-CM | POA: Diagnosis not present

## 2023-01-31 DIAGNOSIS — E1122 Type 2 diabetes mellitus with diabetic chronic kidney disease: Secondary | ICD-10-CM | POA: Diagnosis not present

## 2023-01-31 DIAGNOSIS — D631 Anemia in chronic kidney disease: Secondary | ICD-10-CM | POA: Diagnosis not present

## 2023-01-31 DIAGNOSIS — Z992 Dependence on renal dialysis: Secondary | ICD-10-CM | POA: Diagnosis not present

## 2023-01-31 DIAGNOSIS — N2581 Secondary hyperparathyroidism of renal origin: Secondary | ICD-10-CM | POA: Diagnosis not present

## 2023-02-02 DIAGNOSIS — N2581 Secondary hyperparathyroidism of renal origin: Secondary | ICD-10-CM | POA: Diagnosis not present

## 2023-02-02 DIAGNOSIS — D689 Coagulation defect, unspecified: Secondary | ICD-10-CM | POA: Diagnosis not present

## 2023-02-02 DIAGNOSIS — Z992 Dependence on renal dialysis: Secondary | ICD-10-CM | POA: Diagnosis not present

## 2023-02-02 DIAGNOSIS — D631 Anemia in chronic kidney disease: Secondary | ICD-10-CM | POA: Diagnosis not present

## 2023-02-02 DIAGNOSIS — A499 Bacterial infection, unspecified: Secondary | ICD-10-CM | POA: Diagnosis not present

## 2023-02-02 DIAGNOSIS — E1122 Type 2 diabetes mellitus with diabetic chronic kidney disease: Secondary | ICD-10-CM | POA: Diagnosis not present

## 2023-02-02 DIAGNOSIS — D509 Iron deficiency anemia, unspecified: Secondary | ICD-10-CM | POA: Diagnosis not present

## 2023-02-02 DIAGNOSIS — N186 End stage renal disease: Secondary | ICD-10-CM | POA: Diagnosis not present

## 2023-02-03 DIAGNOSIS — T82858A Stenosis of vascular prosthetic devices, implants and grafts, initial encounter: Secondary | ICD-10-CM | POA: Diagnosis not present

## 2023-02-03 DIAGNOSIS — N186 End stage renal disease: Secondary | ICD-10-CM | POA: Diagnosis not present

## 2023-02-03 DIAGNOSIS — I871 Compression of vein: Secondary | ICD-10-CM | POA: Diagnosis not present

## 2023-02-03 DIAGNOSIS — Z992 Dependence on renal dialysis: Secondary | ICD-10-CM | POA: Diagnosis not present

## 2023-02-07 DIAGNOSIS — N186 End stage renal disease: Secondary | ICD-10-CM | POA: Diagnosis not present

## 2023-02-07 DIAGNOSIS — A499 Bacterial infection, unspecified: Secondary | ICD-10-CM | POA: Diagnosis not present

## 2023-02-07 DIAGNOSIS — N2581 Secondary hyperparathyroidism of renal origin: Secondary | ICD-10-CM | POA: Diagnosis not present

## 2023-02-07 DIAGNOSIS — D509 Iron deficiency anemia, unspecified: Secondary | ICD-10-CM | POA: Diagnosis not present

## 2023-02-07 DIAGNOSIS — D689 Coagulation defect, unspecified: Secondary | ICD-10-CM | POA: Diagnosis not present

## 2023-02-07 DIAGNOSIS — D631 Anemia in chronic kidney disease: Secondary | ICD-10-CM | POA: Diagnosis not present

## 2023-02-07 DIAGNOSIS — E1122 Type 2 diabetes mellitus with diabetic chronic kidney disease: Secondary | ICD-10-CM | POA: Diagnosis not present

## 2023-02-07 DIAGNOSIS — Z992 Dependence on renal dialysis: Secondary | ICD-10-CM | POA: Diagnosis not present

## 2023-02-09 DIAGNOSIS — N2581 Secondary hyperparathyroidism of renal origin: Secondary | ICD-10-CM | POA: Diagnosis not present

## 2023-02-09 DIAGNOSIS — A499 Bacterial infection, unspecified: Secondary | ICD-10-CM | POA: Diagnosis not present

## 2023-02-09 DIAGNOSIS — D631 Anemia in chronic kidney disease: Secondary | ICD-10-CM | POA: Diagnosis not present

## 2023-02-09 DIAGNOSIS — N186 End stage renal disease: Secondary | ICD-10-CM | POA: Diagnosis not present

## 2023-02-09 DIAGNOSIS — E1122 Type 2 diabetes mellitus with diabetic chronic kidney disease: Secondary | ICD-10-CM | POA: Diagnosis not present

## 2023-02-09 DIAGNOSIS — Z992 Dependence on renal dialysis: Secondary | ICD-10-CM | POA: Diagnosis not present

## 2023-02-09 DIAGNOSIS — D509 Iron deficiency anemia, unspecified: Secondary | ICD-10-CM | POA: Diagnosis not present

## 2023-02-09 DIAGNOSIS — D689 Coagulation defect, unspecified: Secondary | ICD-10-CM | POA: Diagnosis not present

## 2023-02-11 DIAGNOSIS — M533 Sacrococcygeal disorders, not elsewhere classified: Secondary | ICD-10-CM | POA: Diagnosis not present

## 2023-02-11 DIAGNOSIS — E1122 Type 2 diabetes mellitus with diabetic chronic kidney disease: Secondary | ICD-10-CM | POA: Diagnosis not present

## 2023-02-11 DIAGNOSIS — A499 Bacterial infection, unspecified: Secondary | ICD-10-CM | POA: Diagnosis not present

## 2023-02-11 DIAGNOSIS — D689 Coagulation defect, unspecified: Secondary | ICD-10-CM | POA: Diagnosis not present

## 2023-02-11 DIAGNOSIS — Z993 Dependence on wheelchair: Secondary | ICD-10-CM | POA: Diagnosis not present

## 2023-02-11 DIAGNOSIS — D631 Anemia in chronic kidney disease: Secondary | ICD-10-CM | POA: Diagnosis not present

## 2023-02-11 DIAGNOSIS — N186 End stage renal disease: Secondary | ICD-10-CM | POA: Diagnosis not present

## 2023-02-11 DIAGNOSIS — G8191 Hemiplegia, unspecified affecting right dominant side: Secondary | ICD-10-CM | POA: Diagnosis not present

## 2023-02-11 DIAGNOSIS — N2581 Secondary hyperparathyroidism of renal origin: Secondary | ICD-10-CM | POA: Diagnosis not present

## 2023-02-11 DIAGNOSIS — Z992 Dependence on renal dialysis: Secondary | ICD-10-CM | POA: Diagnosis not present

## 2023-02-11 DIAGNOSIS — D509 Iron deficiency anemia, unspecified: Secondary | ICD-10-CM | POA: Diagnosis not present

## 2023-02-11 DIAGNOSIS — G35 Multiple sclerosis: Secondary | ICD-10-CM | POA: Diagnosis not present

## 2023-02-14 DIAGNOSIS — D689 Coagulation defect, unspecified: Secondary | ICD-10-CM | POA: Diagnosis not present

## 2023-02-14 DIAGNOSIS — N2581 Secondary hyperparathyroidism of renal origin: Secondary | ICD-10-CM | POA: Diagnosis not present

## 2023-02-14 DIAGNOSIS — D631 Anemia in chronic kidney disease: Secondary | ICD-10-CM | POA: Diagnosis not present

## 2023-02-14 DIAGNOSIS — E1122 Type 2 diabetes mellitus with diabetic chronic kidney disease: Secondary | ICD-10-CM | POA: Diagnosis not present

## 2023-02-14 DIAGNOSIS — E1022 Type 1 diabetes mellitus with diabetic chronic kidney disease: Secondary | ICD-10-CM | POA: Diagnosis not present

## 2023-02-14 DIAGNOSIS — Z992 Dependence on renal dialysis: Secondary | ICD-10-CM | POA: Diagnosis not present

## 2023-02-14 DIAGNOSIS — A499 Bacterial infection, unspecified: Secondary | ICD-10-CM | POA: Diagnosis not present

## 2023-02-14 DIAGNOSIS — D509 Iron deficiency anemia, unspecified: Secondary | ICD-10-CM | POA: Diagnosis not present

## 2023-02-14 DIAGNOSIS — N186 End stage renal disease: Secondary | ICD-10-CM | POA: Diagnosis not present

## 2023-02-16 DIAGNOSIS — T7840XA Allergy, unspecified, initial encounter: Secondary | ICD-10-CM | POA: Diagnosis not present

## 2023-02-16 DIAGNOSIS — L299 Pruritus, unspecified: Secondary | ICD-10-CM | POA: Diagnosis not present

## 2023-02-16 DIAGNOSIS — N2581 Secondary hyperparathyroidism of renal origin: Secondary | ICD-10-CM | POA: Diagnosis not present

## 2023-02-16 DIAGNOSIS — D689 Coagulation defect, unspecified: Secondary | ICD-10-CM | POA: Diagnosis not present

## 2023-02-16 DIAGNOSIS — Z992 Dependence on renal dialysis: Secondary | ICD-10-CM | POA: Diagnosis not present

## 2023-02-16 DIAGNOSIS — N186 End stage renal disease: Secondary | ICD-10-CM | POA: Diagnosis not present

## 2023-02-16 DIAGNOSIS — E1122 Type 2 diabetes mellitus with diabetic chronic kidney disease: Secondary | ICD-10-CM | POA: Diagnosis not present

## 2023-02-16 DIAGNOSIS — D631 Anemia in chronic kidney disease: Secondary | ICD-10-CM | POA: Diagnosis not present

## 2023-02-16 DIAGNOSIS — D509 Iron deficiency anemia, unspecified: Secondary | ICD-10-CM | POA: Diagnosis not present

## 2023-02-18 DIAGNOSIS — D509 Iron deficiency anemia, unspecified: Secondary | ICD-10-CM | POA: Diagnosis not present

## 2023-02-18 DIAGNOSIS — Z992 Dependence on renal dialysis: Secondary | ICD-10-CM | POA: Diagnosis not present

## 2023-02-18 DIAGNOSIS — L299 Pruritus, unspecified: Secondary | ICD-10-CM | POA: Diagnosis not present

## 2023-02-18 DIAGNOSIS — D689 Coagulation defect, unspecified: Secondary | ICD-10-CM | POA: Diagnosis not present

## 2023-02-18 DIAGNOSIS — D631 Anemia in chronic kidney disease: Secondary | ICD-10-CM | POA: Diagnosis not present

## 2023-02-18 DIAGNOSIS — N186 End stage renal disease: Secondary | ICD-10-CM | POA: Diagnosis not present

## 2023-02-18 DIAGNOSIS — N2581 Secondary hyperparathyroidism of renal origin: Secondary | ICD-10-CM | POA: Diagnosis not present

## 2023-02-18 DIAGNOSIS — T7840XA Allergy, unspecified, initial encounter: Secondary | ICD-10-CM | POA: Diagnosis not present

## 2023-02-18 DIAGNOSIS — E1122 Type 2 diabetes mellitus with diabetic chronic kidney disease: Secondary | ICD-10-CM | POA: Diagnosis not present

## 2023-02-21 DIAGNOSIS — D689 Coagulation defect, unspecified: Secondary | ICD-10-CM | POA: Diagnosis not present

## 2023-02-21 DIAGNOSIS — L299 Pruritus, unspecified: Secondary | ICD-10-CM | POA: Diagnosis not present

## 2023-02-21 DIAGNOSIS — T7840XA Allergy, unspecified, initial encounter: Secondary | ICD-10-CM | POA: Diagnosis not present

## 2023-02-21 DIAGNOSIS — N2581 Secondary hyperparathyroidism of renal origin: Secondary | ICD-10-CM | POA: Diagnosis not present

## 2023-02-21 DIAGNOSIS — N186 End stage renal disease: Secondary | ICD-10-CM | POA: Diagnosis not present

## 2023-02-21 DIAGNOSIS — D509 Iron deficiency anemia, unspecified: Secondary | ICD-10-CM | POA: Diagnosis not present

## 2023-02-21 DIAGNOSIS — Z992 Dependence on renal dialysis: Secondary | ICD-10-CM | POA: Diagnosis not present

## 2023-02-21 DIAGNOSIS — E1122 Type 2 diabetes mellitus with diabetic chronic kidney disease: Secondary | ICD-10-CM | POA: Diagnosis not present

## 2023-02-21 DIAGNOSIS — D631 Anemia in chronic kidney disease: Secondary | ICD-10-CM | POA: Diagnosis not present

## 2023-02-23 DIAGNOSIS — N186 End stage renal disease: Secondary | ICD-10-CM | POA: Diagnosis not present

## 2023-02-23 DIAGNOSIS — Z992 Dependence on renal dialysis: Secondary | ICD-10-CM | POA: Diagnosis not present

## 2023-02-23 DIAGNOSIS — D509 Iron deficiency anemia, unspecified: Secondary | ICD-10-CM | POA: Diagnosis not present

## 2023-02-23 DIAGNOSIS — L299 Pruritus, unspecified: Secondary | ICD-10-CM | POA: Diagnosis not present

## 2023-02-23 DIAGNOSIS — D631 Anemia in chronic kidney disease: Secondary | ICD-10-CM | POA: Diagnosis not present

## 2023-02-23 DIAGNOSIS — N2581 Secondary hyperparathyroidism of renal origin: Secondary | ICD-10-CM | POA: Diagnosis not present

## 2023-02-23 DIAGNOSIS — T7840XA Allergy, unspecified, initial encounter: Secondary | ICD-10-CM | POA: Diagnosis not present

## 2023-02-23 DIAGNOSIS — D689 Coagulation defect, unspecified: Secondary | ICD-10-CM | POA: Diagnosis not present

## 2023-02-23 DIAGNOSIS — E1122 Type 2 diabetes mellitus with diabetic chronic kidney disease: Secondary | ICD-10-CM | POA: Diagnosis not present

## 2023-02-25 DIAGNOSIS — Z992 Dependence on renal dialysis: Secondary | ICD-10-CM | POA: Diagnosis not present

## 2023-02-25 DIAGNOSIS — L299 Pruritus, unspecified: Secondary | ICD-10-CM | POA: Diagnosis not present

## 2023-02-25 DIAGNOSIS — N2581 Secondary hyperparathyroidism of renal origin: Secondary | ICD-10-CM | POA: Diagnosis not present

## 2023-02-25 DIAGNOSIS — N186 End stage renal disease: Secondary | ICD-10-CM | POA: Diagnosis not present

## 2023-02-25 DIAGNOSIS — T7840XA Allergy, unspecified, initial encounter: Secondary | ICD-10-CM | POA: Diagnosis not present

## 2023-02-25 DIAGNOSIS — E1122 Type 2 diabetes mellitus with diabetic chronic kidney disease: Secondary | ICD-10-CM | POA: Diagnosis not present

## 2023-02-25 DIAGNOSIS — D689 Coagulation defect, unspecified: Secondary | ICD-10-CM | POA: Diagnosis not present

## 2023-02-25 DIAGNOSIS — D631 Anemia in chronic kidney disease: Secondary | ICD-10-CM | POA: Diagnosis not present

## 2023-02-25 DIAGNOSIS — D509 Iron deficiency anemia, unspecified: Secondary | ICD-10-CM | POA: Diagnosis not present

## 2023-02-28 DIAGNOSIS — N186 End stage renal disease: Secondary | ICD-10-CM | POA: Diagnosis not present

## 2023-02-28 DIAGNOSIS — L299 Pruritus, unspecified: Secondary | ICD-10-CM | POA: Diagnosis not present

## 2023-02-28 DIAGNOSIS — E1122 Type 2 diabetes mellitus with diabetic chronic kidney disease: Secondary | ICD-10-CM | POA: Diagnosis not present

## 2023-02-28 DIAGNOSIS — T7840XA Allergy, unspecified, initial encounter: Secondary | ICD-10-CM | POA: Diagnosis not present

## 2023-02-28 DIAGNOSIS — D509 Iron deficiency anemia, unspecified: Secondary | ICD-10-CM | POA: Diagnosis not present

## 2023-02-28 DIAGNOSIS — D631 Anemia in chronic kidney disease: Secondary | ICD-10-CM | POA: Diagnosis not present

## 2023-02-28 DIAGNOSIS — N2581 Secondary hyperparathyroidism of renal origin: Secondary | ICD-10-CM | POA: Diagnosis not present

## 2023-02-28 DIAGNOSIS — D689 Coagulation defect, unspecified: Secondary | ICD-10-CM | POA: Diagnosis not present

## 2023-02-28 DIAGNOSIS — Z992 Dependence on renal dialysis: Secondary | ICD-10-CM | POA: Diagnosis not present

## 2023-03-02 DIAGNOSIS — D509 Iron deficiency anemia, unspecified: Secondary | ICD-10-CM | POA: Diagnosis not present

## 2023-03-02 DIAGNOSIS — T7840XA Allergy, unspecified, initial encounter: Secondary | ICD-10-CM | POA: Diagnosis not present

## 2023-03-02 DIAGNOSIS — Z992 Dependence on renal dialysis: Secondary | ICD-10-CM | POA: Diagnosis not present

## 2023-03-02 DIAGNOSIS — N2581 Secondary hyperparathyroidism of renal origin: Secondary | ICD-10-CM | POA: Diagnosis not present

## 2023-03-02 DIAGNOSIS — D631 Anemia in chronic kidney disease: Secondary | ICD-10-CM | POA: Diagnosis not present

## 2023-03-02 DIAGNOSIS — E1122 Type 2 diabetes mellitus with diabetic chronic kidney disease: Secondary | ICD-10-CM | POA: Diagnosis not present

## 2023-03-02 DIAGNOSIS — L299 Pruritus, unspecified: Secondary | ICD-10-CM | POA: Diagnosis not present

## 2023-03-02 DIAGNOSIS — D689 Coagulation defect, unspecified: Secondary | ICD-10-CM | POA: Diagnosis not present

## 2023-03-02 DIAGNOSIS — N186 End stage renal disease: Secondary | ICD-10-CM | POA: Diagnosis not present

## 2023-03-04 DIAGNOSIS — E1122 Type 2 diabetes mellitus with diabetic chronic kidney disease: Secondary | ICD-10-CM | POA: Diagnosis not present

## 2023-03-04 DIAGNOSIS — N2581 Secondary hyperparathyroidism of renal origin: Secondary | ICD-10-CM | POA: Diagnosis not present

## 2023-03-04 DIAGNOSIS — L299 Pruritus, unspecified: Secondary | ICD-10-CM | POA: Diagnosis not present

## 2023-03-04 DIAGNOSIS — T7840XA Allergy, unspecified, initial encounter: Secondary | ICD-10-CM | POA: Diagnosis not present

## 2023-03-04 DIAGNOSIS — D689 Coagulation defect, unspecified: Secondary | ICD-10-CM | POA: Diagnosis not present

## 2023-03-04 DIAGNOSIS — D509 Iron deficiency anemia, unspecified: Secondary | ICD-10-CM | POA: Diagnosis not present

## 2023-03-04 DIAGNOSIS — Z992 Dependence on renal dialysis: Secondary | ICD-10-CM | POA: Diagnosis not present

## 2023-03-04 DIAGNOSIS — N186 End stage renal disease: Secondary | ICD-10-CM | POA: Diagnosis not present

## 2023-03-04 DIAGNOSIS — D631 Anemia in chronic kidney disease: Secondary | ICD-10-CM | POA: Diagnosis not present

## 2023-03-07 DIAGNOSIS — D631 Anemia in chronic kidney disease: Secondary | ICD-10-CM | POA: Diagnosis not present

## 2023-03-07 DIAGNOSIS — L299 Pruritus, unspecified: Secondary | ICD-10-CM | POA: Diagnosis not present

## 2023-03-07 DIAGNOSIS — T7840XA Allergy, unspecified, initial encounter: Secondary | ICD-10-CM | POA: Diagnosis not present

## 2023-03-07 DIAGNOSIS — D509 Iron deficiency anemia, unspecified: Secondary | ICD-10-CM | POA: Diagnosis not present

## 2023-03-07 DIAGNOSIS — N2581 Secondary hyperparathyroidism of renal origin: Secondary | ICD-10-CM | POA: Diagnosis not present

## 2023-03-07 DIAGNOSIS — N186 End stage renal disease: Secondary | ICD-10-CM | POA: Diagnosis not present

## 2023-03-07 DIAGNOSIS — Z992 Dependence on renal dialysis: Secondary | ICD-10-CM | POA: Diagnosis not present

## 2023-03-07 DIAGNOSIS — E1122 Type 2 diabetes mellitus with diabetic chronic kidney disease: Secondary | ICD-10-CM | POA: Diagnosis not present

## 2023-03-07 DIAGNOSIS — D689 Coagulation defect, unspecified: Secondary | ICD-10-CM | POA: Diagnosis not present

## 2023-03-09 DIAGNOSIS — N186 End stage renal disease: Secondary | ICD-10-CM | POA: Diagnosis not present

## 2023-03-09 DIAGNOSIS — N2581 Secondary hyperparathyroidism of renal origin: Secondary | ICD-10-CM | POA: Diagnosis not present

## 2023-03-09 DIAGNOSIS — D689 Coagulation defect, unspecified: Secondary | ICD-10-CM | POA: Diagnosis not present

## 2023-03-09 DIAGNOSIS — Z992 Dependence on renal dialysis: Secondary | ICD-10-CM | POA: Diagnosis not present

## 2023-03-09 DIAGNOSIS — D509 Iron deficiency anemia, unspecified: Secondary | ICD-10-CM | POA: Diagnosis not present

## 2023-03-09 DIAGNOSIS — L299 Pruritus, unspecified: Secondary | ICD-10-CM | POA: Diagnosis not present

## 2023-03-09 DIAGNOSIS — T7840XA Allergy, unspecified, initial encounter: Secondary | ICD-10-CM | POA: Diagnosis not present

## 2023-03-09 DIAGNOSIS — D631 Anemia in chronic kidney disease: Secondary | ICD-10-CM | POA: Diagnosis not present

## 2023-03-09 DIAGNOSIS — E1122 Type 2 diabetes mellitus with diabetic chronic kidney disease: Secondary | ICD-10-CM | POA: Diagnosis not present

## 2023-03-11 DIAGNOSIS — L299 Pruritus, unspecified: Secondary | ICD-10-CM | POA: Diagnosis not present

## 2023-03-11 DIAGNOSIS — D509 Iron deficiency anemia, unspecified: Secondary | ICD-10-CM | POA: Diagnosis not present

## 2023-03-11 DIAGNOSIS — E1122 Type 2 diabetes mellitus with diabetic chronic kidney disease: Secondary | ICD-10-CM | POA: Diagnosis not present

## 2023-03-11 DIAGNOSIS — Z992 Dependence on renal dialysis: Secondary | ICD-10-CM | POA: Diagnosis not present

## 2023-03-11 DIAGNOSIS — N2581 Secondary hyperparathyroidism of renal origin: Secondary | ICD-10-CM | POA: Diagnosis not present

## 2023-03-11 DIAGNOSIS — D631 Anemia in chronic kidney disease: Secondary | ICD-10-CM | POA: Diagnosis not present

## 2023-03-11 DIAGNOSIS — N186 End stage renal disease: Secondary | ICD-10-CM | POA: Diagnosis not present

## 2023-03-11 DIAGNOSIS — T7840XA Allergy, unspecified, initial encounter: Secondary | ICD-10-CM | POA: Diagnosis not present

## 2023-03-11 DIAGNOSIS — D689 Coagulation defect, unspecified: Secondary | ICD-10-CM | POA: Diagnosis not present

## 2023-03-14 DIAGNOSIS — E1122 Type 2 diabetes mellitus with diabetic chronic kidney disease: Secondary | ICD-10-CM | POA: Diagnosis not present

## 2023-03-14 DIAGNOSIS — Z23 Encounter for immunization: Secondary | ICD-10-CM | POA: Diagnosis not present

## 2023-03-14 DIAGNOSIS — Z992 Dependence on renal dialysis: Secondary | ICD-10-CM | POA: Diagnosis not present

## 2023-03-14 DIAGNOSIS — Z681 Body mass index (BMI) 19 or less, adult: Secondary | ICD-10-CM | POA: Diagnosis not present

## 2023-03-14 DIAGNOSIS — D509 Iron deficiency anemia, unspecified: Secondary | ICD-10-CM | POA: Diagnosis not present

## 2023-03-14 DIAGNOSIS — T7840XA Allergy, unspecified, initial encounter: Secondary | ICD-10-CM | POA: Diagnosis not present

## 2023-03-14 DIAGNOSIS — D689 Coagulation defect, unspecified: Secondary | ICD-10-CM | POA: Diagnosis not present

## 2023-03-14 DIAGNOSIS — D631 Anemia in chronic kidney disease: Secondary | ICD-10-CM | POA: Diagnosis not present

## 2023-03-14 DIAGNOSIS — I700 Atherosclerosis of aorta: Secondary | ICD-10-CM | POA: Diagnosis not present

## 2023-03-14 DIAGNOSIS — E1169 Type 2 diabetes mellitus with other specified complication: Secondary | ICD-10-CM | POA: Diagnosis not present

## 2023-03-14 DIAGNOSIS — Z0000 Encounter for general adult medical examination without abnormal findings: Secondary | ICD-10-CM | POA: Diagnosis not present

## 2023-03-14 DIAGNOSIS — K2100 Gastro-esophageal reflux disease with esophagitis, without bleeding: Secondary | ICD-10-CM | POA: Diagnosis not present

## 2023-03-14 DIAGNOSIS — N2581 Secondary hyperparathyroidism of renal origin: Secondary | ICD-10-CM | POA: Diagnosis not present

## 2023-03-14 DIAGNOSIS — G35 Multiple sclerosis: Secondary | ICD-10-CM | POA: Diagnosis not present

## 2023-03-14 DIAGNOSIS — N186 End stage renal disease: Secondary | ICD-10-CM | POA: Diagnosis not present

## 2023-03-14 DIAGNOSIS — L299 Pruritus, unspecified: Secondary | ICD-10-CM | POA: Diagnosis not present

## 2023-03-16 DIAGNOSIS — D689 Coagulation defect, unspecified: Secondary | ICD-10-CM | POA: Diagnosis not present

## 2023-03-16 DIAGNOSIS — Z992 Dependence on renal dialysis: Secondary | ICD-10-CM | POA: Diagnosis not present

## 2023-03-16 DIAGNOSIS — L299 Pruritus, unspecified: Secondary | ICD-10-CM | POA: Diagnosis not present

## 2023-03-16 DIAGNOSIS — T7840XA Allergy, unspecified, initial encounter: Secondary | ICD-10-CM | POA: Diagnosis not present

## 2023-03-16 DIAGNOSIS — N2581 Secondary hyperparathyroidism of renal origin: Secondary | ICD-10-CM | POA: Diagnosis not present

## 2023-03-16 DIAGNOSIS — E1122 Type 2 diabetes mellitus with diabetic chronic kidney disease: Secondary | ICD-10-CM | POA: Diagnosis not present

## 2023-03-16 DIAGNOSIS — N186 End stage renal disease: Secondary | ICD-10-CM | POA: Diagnosis not present

## 2023-03-16 DIAGNOSIS — D509 Iron deficiency anemia, unspecified: Secondary | ICD-10-CM | POA: Diagnosis not present

## 2023-03-16 DIAGNOSIS — D631 Anemia in chronic kidney disease: Secondary | ICD-10-CM | POA: Diagnosis not present

## 2023-03-17 DIAGNOSIS — Z992 Dependence on renal dialysis: Secondary | ICD-10-CM | POA: Diagnosis not present

## 2023-03-17 DIAGNOSIS — E1022 Type 1 diabetes mellitus with diabetic chronic kidney disease: Secondary | ICD-10-CM | POA: Diagnosis not present

## 2023-03-17 DIAGNOSIS — N186 End stage renal disease: Secondary | ICD-10-CM | POA: Diagnosis not present

## 2023-03-18 DIAGNOSIS — Z992 Dependence on renal dialysis: Secondary | ICD-10-CM | POA: Diagnosis not present

## 2023-03-18 DIAGNOSIS — N186 End stage renal disease: Secondary | ICD-10-CM | POA: Diagnosis not present

## 2023-03-18 DIAGNOSIS — R519 Headache, unspecified: Secondary | ICD-10-CM | POA: Diagnosis not present

## 2023-03-18 DIAGNOSIS — D509 Iron deficiency anemia, unspecified: Secondary | ICD-10-CM | POA: Diagnosis not present

## 2023-03-18 DIAGNOSIS — E1122 Type 2 diabetes mellitus with diabetic chronic kidney disease: Secondary | ICD-10-CM | POA: Diagnosis not present

## 2023-03-18 DIAGNOSIS — D689 Coagulation defect, unspecified: Secondary | ICD-10-CM | POA: Diagnosis not present

## 2023-03-18 DIAGNOSIS — N2581 Secondary hyperparathyroidism of renal origin: Secondary | ICD-10-CM | POA: Diagnosis not present

## 2023-03-18 DIAGNOSIS — D631 Anemia in chronic kidney disease: Secondary | ICD-10-CM | POA: Diagnosis not present

## 2023-03-21 DIAGNOSIS — E1122 Type 2 diabetes mellitus with diabetic chronic kidney disease: Secondary | ICD-10-CM | POA: Diagnosis not present

## 2023-03-21 DIAGNOSIS — R519 Headache, unspecified: Secondary | ICD-10-CM | POA: Diagnosis not present

## 2023-03-21 DIAGNOSIS — D509 Iron deficiency anemia, unspecified: Secondary | ICD-10-CM | POA: Diagnosis not present

## 2023-03-21 DIAGNOSIS — N186 End stage renal disease: Secondary | ICD-10-CM | POA: Diagnosis not present

## 2023-03-21 DIAGNOSIS — D631 Anemia in chronic kidney disease: Secondary | ICD-10-CM | POA: Diagnosis not present

## 2023-03-21 DIAGNOSIS — D689 Coagulation defect, unspecified: Secondary | ICD-10-CM | POA: Diagnosis not present

## 2023-03-21 DIAGNOSIS — Z992 Dependence on renal dialysis: Secondary | ICD-10-CM | POA: Diagnosis not present

## 2023-03-21 DIAGNOSIS — N2581 Secondary hyperparathyroidism of renal origin: Secondary | ICD-10-CM | POA: Diagnosis not present

## 2023-03-23 DIAGNOSIS — Z992 Dependence on renal dialysis: Secondary | ICD-10-CM | POA: Diagnosis not present

## 2023-03-23 DIAGNOSIS — D689 Coagulation defect, unspecified: Secondary | ICD-10-CM | POA: Diagnosis not present

## 2023-03-23 DIAGNOSIS — D509 Iron deficiency anemia, unspecified: Secondary | ICD-10-CM | POA: Diagnosis not present

## 2023-03-23 DIAGNOSIS — E1122 Type 2 diabetes mellitus with diabetic chronic kidney disease: Secondary | ICD-10-CM | POA: Diagnosis not present

## 2023-03-23 DIAGNOSIS — N186 End stage renal disease: Secondary | ICD-10-CM | POA: Diagnosis not present

## 2023-03-23 DIAGNOSIS — N2581 Secondary hyperparathyroidism of renal origin: Secondary | ICD-10-CM | POA: Diagnosis not present

## 2023-03-23 DIAGNOSIS — R519 Headache, unspecified: Secondary | ICD-10-CM | POA: Diagnosis not present

## 2023-03-23 DIAGNOSIS — D631 Anemia in chronic kidney disease: Secondary | ICD-10-CM | POA: Diagnosis not present

## 2023-03-25 DIAGNOSIS — D689 Coagulation defect, unspecified: Secondary | ICD-10-CM | POA: Diagnosis not present

## 2023-03-25 DIAGNOSIS — D631 Anemia in chronic kidney disease: Secondary | ICD-10-CM | POA: Diagnosis not present

## 2023-03-25 DIAGNOSIS — N2581 Secondary hyperparathyroidism of renal origin: Secondary | ICD-10-CM | POA: Diagnosis not present

## 2023-03-25 DIAGNOSIS — D509 Iron deficiency anemia, unspecified: Secondary | ICD-10-CM | POA: Diagnosis not present

## 2023-03-25 DIAGNOSIS — Z992 Dependence on renal dialysis: Secondary | ICD-10-CM | POA: Diagnosis not present

## 2023-03-25 DIAGNOSIS — R519 Headache, unspecified: Secondary | ICD-10-CM | POA: Diagnosis not present

## 2023-03-25 DIAGNOSIS — N186 End stage renal disease: Secondary | ICD-10-CM | POA: Diagnosis not present

## 2023-03-25 DIAGNOSIS — E1122 Type 2 diabetes mellitus with diabetic chronic kidney disease: Secondary | ICD-10-CM | POA: Diagnosis not present

## 2023-03-28 DIAGNOSIS — D631 Anemia in chronic kidney disease: Secondary | ICD-10-CM | POA: Diagnosis not present

## 2023-03-28 DIAGNOSIS — D689 Coagulation defect, unspecified: Secondary | ICD-10-CM | POA: Diagnosis not present

## 2023-03-28 DIAGNOSIS — D509 Iron deficiency anemia, unspecified: Secondary | ICD-10-CM | POA: Diagnosis not present

## 2023-03-28 DIAGNOSIS — R519 Headache, unspecified: Secondary | ICD-10-CM | POA: Diagnosis not present

## 2023-03-28 DIAGNOSIS — E1122 Type 2 diabetes mellitus with diabetic chronic kidney disease: Secondary | ICD-10-CM | POA: Diagnosis not present

## 2023-03-28 DIAGNOSIS — Z992 Dependence on renal dialysis: Secondary | ICD-10-CM | POA: Diagnosis not present

## 2023-03-28 DIAGNOSIS — N186 End stage renal disease: Secondary | ICD-10-CM | POA: Diagnosis not present

## 2023-03-28 DIAGNOSIS — N2581 Secondary hyperparathyroidism of renal origin: Secondary | ICD-10-CM | POA: Diagnosis not present

## 2023-03-29 DIAGNOSIS — H3581 Retinal edema: Secondary | ICD-10-CM | POA: Diagnosis not present

## 2023-03-29 DIAGNOSIS — Z961 Presence of intraocular lens: Secondary | ICD-10-CM | POA: Diagnosis not present

## 2023-03-29 DIAGNOSIS — G35 Multiple sclerosis: Secondary | ICD-10-CM | POA: Diagnosis not present

## 2023-03-29 DIAGNOSIS — H30033 Focal chorioretinal inflammation, peripheral, bilateral: Secondary | ICD-10-CM | POA: Diagnosis not present

## 2023-03-29 DIAGNOSIS — E113293 Type 2 diabetes mellitus with mild nonproliferative diabetic retinopathy without macular edema, bilateral: Secondary | ICD-10-CM | POA: Diagnosis not present

## 2023-03-30 DIAGNOSIS — N186 End stage renal disease: Secondary | ICD-10-CM | POA: Diagnosis not present

## 2023-03-30 DIAGNOSIS — R519 Headache, unspecified: Secondary | ICD-10-CM | POA: Diagnosis not present

## 2023-03-30 DIAGNOSIS — Z992 Dependence on renal dialysis: Secondary | ICD-10-CM | POA: Diagnosis not present

## 2023-03-30 DIAGNOSIS — D509 Iron deficiency anemia, unspecified: Secondary | ICD-10-CM | POA: Diagnosis not present

## 2023-03-30 DIAGNOSIS — E1122 Type 2 diabetes mellitus with diabetic chronic kidney disease: Secondary | ICD-10-CM | POA: Diagnosis not present

## 2023-03-30 DIAGNOSIS — D631 Anemia in chronic kidney disease: Secondary | ICD-10-CM | POA: Diagnosis not present

## 2023-03-30 DIAGNOSIS — D689 Coagulation defect, unspecified: Secondary | ICD-10-CM | POA: Diagnosis not present

## 2023-03-30 DIAGNOSIS — N2581 Secondary hyperparathyroidism of renal origin: Secondary | ICD-10-CM | POA: Diagnosis not present

## 2023-04-01 DIAGNOSIS — N186 End stage renal disease: Secondary | ICD-10-CM | POA: Diagnosis not present

## 2023-04-01 DIAGNOSIS — R519 Headache, unspecified: Secondary | ICD-10-CM | POA: Diagnosis not present

## 2023-04-01 DIAGNOSIS — E1122 Type 2 diabetes mellitus with diabetic chronic kidney disease: Secondary | ICD-10-CM | POA: Diagnosis not present

## 2023-04-01 DIAGNOSIS — D689 Coagulation defect, unspecified: Secondary | ICD-10-CM | POA: Diagnosis not present

## 2023-04-01 DIAGNOSIS — D631 Anemia in chronic kidney disease: Secondary | ICD-10-CM | POA: Diagnosis not present

## 2023-04-01 DIAGNOSIS — Z992 Dependence on renal dialysis: Secondary | ICD-10-CM | POA: Diagnosis not present

## 2023-04-01 DIAGNOSIS — D509 Iron deficiency anemia, unspecified: Secondary | ICD-10-CM | POA: Diagnosis not present

## 2023-04-01 DIAGNOSIS — N2581 Secondary hyperparathyroidism of renal origin: Secondary | ICD-10-CM | POA: Diagnosis not present

## 2023-04-04 DIAGNOSIS — D509 Iron deficiency anemia, unspecified: Secondary | ICD-10-CM | POA: Diagnosis not present

## 2023-04-04 DIAGNOSIS — N2581 Secondary hyperparathyroidism of renal origin: Secondary | ICD-10-CM | POA: Diagnosis not present

## 2023-04-04 DIAGNOSIS — D689 Coagulation defect, unspecified: Secondary | ICD-10-CM | POA: Diagnosis not present

## 2023-04-04 DIAGNOSIS — R519 Headache, unspecified: Secondary | ICD-10-CM | POA: Diagnosis not present

## 2023-04-04 DIAGNOSIS — Z992 Dependence on renal dialysis: Secondary | ICD-10-CM | POA: Diagnosis not present

## 2023-04-04 DIAGNOSIS — D631 Anemia in chronic kidney disease: Secondary | ICD-10-CM | POA: Diagnosis not present

## 2023-04-04 DIAGNOSIS — E1122 Type 2 diabetes mellitus with diabetic chronic kidney disease: Secondary | ICD-10-CM | POA: Diagnosis not present

## 2023-04-04 DIAGNOSIS — N186 End stage renal disease: Secondary | ICD-10-CM | POA: Diagnosis not present

## 2023-04-06 DIAGNOSIS — D631 Anemia in chronic kidney disease: Secondary | ICD-10-CM | POA: Diagnosis not present

## 2023-04-06 DIAGNOSIS — N186 End stage renal disease: Secondary | ICD-10-CM | POA: Diagnosis not present

## 2023-04-06 DIAGNOSIS — R519 Headache, unspecified: Secondary | ICD-10-CM | POA: Diagnosis not present

## 2023-04-06 DIAGNOSIS — D689 Coagulation defect, unspecified: Secondary | ICD-10-CM | POA: Diagnosis not present

## 2023-04-06 DIAGNOSIS — Z992 Dependence on renal dialysis: Secondary | ICD-10-CM | POA: Diagnosis not present

## 2023-04-06 DIAGNOSIS — D509 Iron deficiency anemia, unspecified: Secondary | ICD-10-CM | POA: Diagnosis not present

## 2023-04-06 DIAGNOSIS — E1122 Type 2 diabetes mellitus with diabetic chronic kidney disease: Secondary | ICD-10-CM | POA: Diagnosis not present

## 2023-04-06 DIAGNOSIS — N2581 Secondary hyperparathyroidism of renal origin: Secondary | ICD-10-CM | POA: Diagnosis not present

## 2023-04-08 DIAGNOSIS — N186 End stage renal disease: Secondary | ICD-10-CM | POA: Diagnosis not present

## 2023-04-08 DIAGNOSIS — E1122 Type 2 diabetes mellitus with diabetic chronic kidney disease: Secondary | ICD-10-CM | POA: Diagnosis not present

## 2023-04-08 DIAGNOSIS — D689 Coagulation defect, unspecified: Secondary | ICD-10-CM | POA: Diagnosis not present

## 2023-04-08 DIAGNOSIS — D509 Iron deficiency anemia, unspecified: Secondary | ICD-10-CM | POA: Diagnosis not present

## 2023-04-08 DIAGNOSIS — D631 Anemia in chronic kidney disease: Secondary | ICD-10-CM | POA: Diagnosis not present

## 2023-04-08 DIAGNOSIS — N2581 Secondary hyperparathyroidism of renal origin: Secondary | ICD-10-CM | POA: Diagnosis not present

## 2023-04-08 DIAGNOSIS — R519 Headache, unspecified: Secondary | ICD-10-CM | POA: Diagnosis not present

## 2023-04-08 DIAGNOSIS — Z992 Dependence on renal dialysis: Secondary | ICD-10-CM | POA: Diagnosis not present

## 2023-04-10 DIAGNOSIS — E1122 Type 2 diabetes mellitus with diabetic chronic kidney disease: Secondary | ICD-10-CM | POA: Diagnosis not present

## 2023-04-10 DIAGNOSIS — D509 Iron deficiency anemia, unspecified: Secondary | ICD-10-CM | POA: Diagnosis not present

## 2023-04-10 DIAGNOSIS — D631 Anemia in chronic kidney disease: Secondary | ICD-10-CM | POA: Diagnosis not present

## 2023-04-10 DIAGNOSIS — Z992 Dependence on renal dialysis: Secondary | ICD-10-CM | POA: Diagnosis not present

## 2023-04-10 DIAGNOSIS — D689 Coagulation defect, unspecified: Secondary | ICD-10-CM | POA: Diagnosis not present

## 2023-04-10 DIAGNOSIS — N186 End stage renal disease: Secondary | ICD-10-CM | POA: Diagnosis not present

## 2023-04-10 DIAGNOSIS — R519 Headache, unspecified: Secondary | ICD-10-CM | POA: Diagnosis not present

## 2023-04-10 DIAGNOSIS — N2581 Secondary hyperparathyroidism of renal origin: Secondary | ICD-10-CM | POA: Diagnosis not present

## 2023-04-12 DIAGNOSIS — D689 Coagulation defect, unspecified: Secondary | ICD-10-CM | POA: Diagnosis not present

## 2023-04-12 DIAGNOSIS — E1122 Type 2 diabetes mellitus with diabetic chronic kidney disease: Secondary | ICD-10-CM | POA: Diagnosis not present

## 2023-04-12 DIAGNOSIS — N2581 Secondary hyperparathyroidism of renal origin: Secondary | ICD-10-CM | POA: Diagnosis not present

## 2023-04-12 DIAGNOSIS — N186 End stage renal disease: Secondary | ICD-10-CM | POA: Diagnosis not present

## 2023-04-12 DIAGNOSIS — D509 Iron deficiency anemia, unspecified: Secondary | ICD-10-CM | POA: Diagnosis not present

## 2023-04-12 DIAGNOSIS — R519 Headache, unspecified: Secondary | ICD-10-CM | POA: Diagnosis not present

## 2023-04-12 DIAGNOSIS — Z992 Dependence on renal dialysis: Secondary | ICD-10-CM | POA: Diagnosis not present

## 2023-04-12 DIAGNOSIS — D631 Anemia in chronic kidney disease: Secondary | ICD-10-CM | POA: Diagnosis not present

## 2023-04-13 ENCOUNTER — Ambulatory Visit: Payer: Medicare Other | Admitting: Family Medicine

## 2023-04-16 DIAGNOSIS — N186 End stage renal disease: Secondary | ICD-10-CM | POA: Diagnosis not present

## 2023-04-16 DIAGNOSIS — E1022 Type 1 diabetes mellitus with diabetic chronic kidney disease: Secondary | ICD-10-CM | POA: Diagnosis not present

## 2023-04-16 DIAGNOSIS — Z992 Dependence on renal dialysis: Secondary | ICD-10-CM | POA: Diagnosis not present

## 2023-04-18 DIAGNOSIS — Z992 Dependence on renal dialysis: Secondary | ICD-10-CM | POA: Diagnosis not present

## 2023-04-18 DIAGNOSIS — D509 Iron deficiency anemia, unspecified: Secondary | ICD-10-CM | POA: Diagnosis not present

## 2023-04-18 DIAGNOSIS — E1122 Type 2 diabetes mellitus with diabetic chronic kidney disease: Secondary | ICD-10-CM | POA: Diagnosis not present

## 2023-04-18 DIAGNOSIS — D689 Coagulation defect, unspecified: Secondary | ICD-10-CM | POA: Diagnosis not present

## 2023-04-18 DIAGNOSIS — N2581 Secondary hyperparathyroidism of renal origin: Secondary | ICD-10-CM | POA: Diagnosis not present

## 2023-04-18 DIAGNOSIS — D631 Anemia in chronic kidney disease: Secondary | ICD-10-CM | POA: Diagnosis not present

## 2023-04-18 DIAGNOSIS — R519 Headache, unspecified: Secondary | ICD-10-CM | POA: Diagnosis not present

## 2023-04-18 DIAGNOSIS — N186 End stage renal disease: Secondary | ICD-10-CM | POA: Diagnosis not present

## 2023-04-20 DIAGNOSIS — E1122 Type 2 diabetes mellitus with diabetic chronic kidney disease: Secondary | ICD-10-CM | POA: Diagnosis not present

## 2023-04-20 DIAGNOSIS — D509 Iron deficiency anemia, unspecified: Secondary | ICD-10-CM | POA: Diagnosis not present

## 2023-04-20 DIAGNOSIS — D689 Coagulation defect, unspecified: Secondary | ICD-10-CM | POA: Diagnosis not present

## 2023-04-20 DIAGNOSIS — R519 Headache, unspecified: Secondary | ICD-10-CM | POA: Diagnosis not present

## 2023-04-20 DIAGNOSIS — Z992 Dependence on renal dialysis: Secondary | ICD-10-CM | POA: Diagnosis not present

## 2023-04-20 DIAGNOSIS — N186 End stage renal disease: Secondary | ICD-10-CM | POA: Diagnosis not present

## 2023-04-20 DIAGNOSIS — D631 Anemia in chronic kidney disease: Secondary | ICD-10-CM | POA: Diagnosis not present

## 2023-04-20 DIAGNOSIS — N2581 Secondary hyperparathyroidism of renal origin: Secondary | ICD-10-CM | POA: Diagnosis not present

## 2023-04-22 DIAGNOSIS — Z992 Dependence on renal dialysis: Secondary | ICD-10-CM | POA: Diagnosis not present

## 2023-04-22 DIAGNOSIS — R519 Headache, unspecified: Secondary | ICD-10-CM | POA: Diagnosis not present

## 2023-04-22 DIAGNOSIS — D631 Anemia in chronic kidney disease: Secondary | ICD-10-CM | POA: Diagnosis not present

## 2023-04-22 DIAGNOSIS — N2581 Secondary hyperparathyroidism of renal origin: Secondary | ICD-10-CM | POA: Diagnosis not present

## 2023-04-22 DIAGNOSIS — N186 End stage renal disease: Secondary | ICD-10-CM | POA: Diagnosis not present

## 2023-04-22 DIAGNOSIS — D689 Coagulation defect, unspecified: Secondary | ICD-10-CM | POA: Diagnosis not present

## 2023-04-22 DIAGNOSIS — E1122 Type 2 diabetes mellitus with diabetic chronic kidney disease: Secondary | ICD-10-CM | POA: Diagnosis not present

## 2023-04-22 DIAGNOSIS — D509 Iron deficiency anemia, unspecified: Secondary | ICD-10-CM | POA: Diagnosis not present

## 2023-04-25 DIAGNOSIS — D631 Anemia in chronic kidney disease: Secondary | ICD-10-CM | POA: Diagnosis not present

## 2023-04-25 DIAGNOSIS — D689 Coagulation defect, unspecified: Secondary | ICD-10-CM | POA: Diagnosis not present

## 2023-04-25 DIAGNOSIS — N2581 Secondary hyperparathyroidism of renal origin: Secondary | ICD-10-CM | POA: Diagnosis not present

## 2023-04-25 DIAGNOSIS — R519 Headache, unspecified: Secondary | ICD-10-CM | POA: Diagnosis not present

## 2023-04-25 DIAGNOSIS — N186 End stage renal disease: Secondary | ICD-10-CM | POA: Diagnosis not present

## 2023-04-25 DIAGNOSIS — D509 Iron deficiency anemia, unspecified: Secondary | ICD-10-CM | POA: Diagnosis not present

## 2023-04-25 DIAGNOSIS — Z992 Dependence on renal dialysis: Secondary | ICD-10-CM | POA: Diagnosis not present

## 2023-04-25 DIAGNOSIS — E1122 Type 2 diabetes mellitus with diabetic chronic kidney disease: Secondary | ICD-10-CM | POA: Diagnosis not present

## 2023-04-26 DIAGNOSIS — Z961 Presence of intraocular lens: Secondary | ICD-10-CM | POA: Diagnosis not present

## 2023-04-26 DIAGNOSIS — G35 Multiple sclerosis: Secondary | ICD-10-CM | POA: Diagnosis not present

## 2023-04-26 DIAGNOSIS — H3581 Retinal edema: Secondary | ICD-10-CM | POA: Diagnosis not present

## 2023-04-26 DIAGNOSIS — H30033 Focal chorioretinal inflammation, peripheral, bilateral: Secondary | ICD-10-CM | POA: Diagnosis not present

## 2023-04-26 DIAGNOSIS — E113293 Type 2 diabetes mellitus with mild nonproliferative diabetic retinopathy without macular edema, bilateral: Secondary | ICD-10-CM | POA: Diagnosis not present

## 2023-04-27 DIAGNOSIS — D509 Iron deficiency anemia, unspecified: Secondary | ICD-10-CM | POA: Diagnosis not present

## 2023-04-27 DIAGNOSIS — N186 End stage renal disease: Secondary | ICD-10-CM | POA: Diagnosis not present

## 2023-04-27 DIAGNOSIS — Z992 Dependence on renal dialysis: Secondary | ICD-10-CM | POA: Diagnosis not present

## 2023-04-27 DIAGNOSIS — D689 Coagulation defect, unspecified: Secondary | ICD-10-CM | POA: Diagnosis not present

## 2023-04-27 DIAGNOSIS — E1122 Type 2 diabetes mellitus with diabetic chronic kidney disease: Secondary | ICD-10-CM | POA: Diagnosis not present

## 2023-04-27 DIAGNOSIS — D631 Anemia in chronic kidney disease: Secondary | ICD-10-CM | POA: Diagnosis not present

## 2023-04-27 DIAGNOSIS — N2581 Secondary hyperparathyroidism of renal origin: Secondary | ICD-10-CM | POA: Diagnosis not present

## 2023-04-27 DIAGNOSIS — R519 Headache, unspecified: Secondary | ICD-10-CM | POA: Diagnosis not present

## 2023-04-29 ENCOUNTER — Encounter (HOSPITAL_COMMUNITY): Payer: Self-pay

## 2023-04-29 ENCOUNTER — Other Ambulatory Visit: Payer: Self-pay

## 2023-04-29 ENCOUNTER — Emergency Department (HOSPITAL_COMMUNITY)
Admission: EM | Admit: 2023-04-29 | Discharge: 2023-04-29 | Disposition: A | Discharge status: Home / Self Care | Payer: Medicare Other | Attending: Emergency Medicine | Admitting: Emergency Medicine

## 2023-04-29 ENCOUNTER — Emergency Department (HOSPITAL_COMMUNITY): Payer: Medicare Other

## 2023-04-29 DIAGNOSIS — Z7902 Long term (current) use of antithrombotics/antiplatelets: Secondary | ICD-10-CM | POA: Insufficient documentation

## 2023-04-29 DIAGNOSIS — E785 Hyperlipidemia, unspecified: Secondary | ICD-10-CM | POA: Insufficient documentation

## 2023-04-29 DIAGNOSIS — Z794 Long term (current) use of insulin: Secondary | ICD-10-CM | POA: Insufficient documentation

## 2023-04-29 DIAGNOSIS — Z955 Presence of coronary angioplasty implant and graft: Secondary | ICD-10-CM | POA: Insufficient documentation

## 2023-04-29 DIAGNOSIS — Z79899 Other long term (current) drug therapy: Secondary | ICD-10-CM | POA: Insufficient documentation

## 2023-04-29 DIAGNOSIS — D696 Thrombocytopenia, unspecified: Secondary | ICD-10-CM | POA: Diagnosis not present

## 2023-04-29 DIAGNOSIS — R7989 Other specified abnormal findings of blood chemistry: Secondary | ICD-10-CM | POA: Insufficient documentation

## 2023-04-29 DIAGNOSIS — Z992 Dependence on renal dialysis: Secondary | ICD-10-CM | POA: Diagnosis not present

## 2023-04-29 DIAGNOSIS — R0789 Other chest pain: Secondary | ICD-10-CM | POA: Diagnosis not present

## 2023-04-29 DIAGNOSIS — I120 Hypertensive chronic kidney disease with stage 5 chronic kidney disease or end stage renal disease: Secondary | ICD-10-CM | POA: Insufficient documentation

## 2023-04-29 DIAGNOSIS — E1122 Type 2 diabetes mellitus with diabetic chronic kidney disease: Secondary | ICD-10-CM | POA: Diagnosis not present

## 2023-04-29 DIAGNOSIS — D689 Coagulation defect, unspecified: Secondary | ICD-10-CM | POA: Diagnosis not present

## 2023-04-29 DIAGNOSIS — D631 Anemia in chronic kidney disease: Secondary | ICD-10-CM | POA: Diagnosis not present

## 2023-04-29 DIAGNOSIS — R6889 Other general symptoms and signs: Secondary | ICD-10-CM | POA: Diagnosis not present

## 2023-04-29 DIAGNOSIS — I2510 Atherosclerotic heart disease of native coronary artery without angina pectoris: Secondary | ICD-10-CM | POA: Insufficient documentation

## 2023-04-29 DIAGNOSIS — N186 End stage renal disease: Secondary | ICD-10-CM | POA: Insufficient documentation

## 2023-04-29 DIAGNOSIS — G35 Multiple sclerosis: Secondary | ICD-10-CM | POA: Diagnosis not present

## 2023-04-29 DIAGNOSIS — R062 Wheezing: Secondary | ICD-10-CM | POA: Insufficient documentation

## 2023-04-29 DIAGNOSIS — R519 Headache, unspecified: Secondary | ICD-10-CM | POA: Diagnosis not present

## 2023-04-29 DIAGNOSIS — I493 Ventricular premature depolarization: Secondary | ICD-10-CM | POA: Insufficient documentation

## 2023-04-29 DIAGNOSIS — N2581 Secondary hyperparathyroidism of renal origin: Secondary | ICD-10-CM | POA: Diagnosis not present

## 2023-04-29 DIAGNOSIS — Z8673 Personal history of transient ischemic attack (TIA), and cerebral infarction without residual deficits: Secondary | ICD-10-CM | POA: Insufficient documentation

## 2023-04-29 DIAGNOSIS — R079 Chest pain, unspecified: Secondary | ICD-10-CM | POA: Diagnosis not present

## 2023-04-29 DIAGNOSIS — R739 Hyperglycemia, unspecified: Secondary | ICD-10-CM | POA: Diagnosis not present

## 2023-04-29 DIAGNOSIS — D509 Iron deficiency anemia, unspecified: Secondary | ICD-10-CM | POA: Diagnosis not present

## 2023-04-29 LAB — CBC
HCT: 26 % — ABNORMAL LOW (ref 36.0–46.0)
Hemoglobin: 8.5 g/dL — ABNORMAL LOW (ref 12.0–15.0)
MCH: 28.4 pg (ref 26.0–34.0)
MCHC: 32.7 g/dL (ref 30.0–36.0)
MCV: 87 fL (ref 80.0–100.0)
Platelets: 112 10*3/uL — ABNORMAL LOW (ref 150–400)
RBC: 2.99 MIL/uL — ABNORMAL LOW (ref 3.87–5.11)
RDW: 13.4 % (ref 11.5–15.5)
WBC: 5.3 10*3/uL (ref 4.0–10.5)
nRBC: 0 % (ref 0.0–0.2)

## 2023-04-29 LAB — BASIC METABOLIC PANEL
Anion gap: 10 (ref 5–15)
BUN: 10 mg/dL (ref 6–20)
CO2: 29 mmol/L (ref 22–32)
Calcium: 8.4 mg/dL — ABNORMAL LOW (ref 8.9–10.3)
Chloride: 98 mmol/L (ref 98–111)
Creatinine, Ser: 2.42 mg/dL — ABNORMAL HIGH (ref 0.44–1.00)
GFR, Estimated: 22 mL/min — ABNORMAL LOW (ref >60)
Glucose, Bld: 186 mg/dL — ABNORMAL HIGH (ref 70–99)
Potassium: 4.4 mmol/L (ref 3.5–5.1)
Sodium: 137 mmol/L (ref 135–145)

## 2023-04-29 LAB — TROPONIN I (HIGH SENSITIVITY)
Troponin I (High Sensitivity): 17 ng/L (ref <18)
Troponin I (High Sensitivity): 21 ng/L — ABNORMAL HIGH (ref <18)

## 2023-04-29 LAB — BRAIN NATRIURETIC PEPTIDE: B Natriuretic Peptide: 200.6 pg/mL — ABNORMAL HIGH (ref 0.0–100.0)

## 2023-04-29 MED ORDER — FENTANYL CITRATE PF 50 MCG/ML IJ SOSY: 50.0000 ug | PREFILLED_SYRINGE | Freq: Once | INTRAMUSCULAR | Status: DC

## 2023-04-29 MED ORDER — ALBUTEROL SULFATE HFA 108 (90 BASE) MCG/ACT IN AERS: 1.0000 | INHALATION_SPRAY | Freq: Once | RESPIRATORY_TRACT | Status: DC

## 2023-04-29 MED ORDER — NITROGLYCERIN 0.4 MG SL SUBL: 0.4000 mg | SUBLINGUAL_TABLET | SUBLINGUAL | Status: DC | PRN

## 2023-04-29 NOTE — ED Triage Notes (Signed)
Patient brought from home.  After completing dialysis went home and started having central chest pain.  Took a nitroglycerin and when ems arrived It had subsided.  But upon arrival to hospital patient started having chest pain again. Patient did complete full treatment at dialysis

## 2023-04-29 NOTE — Discharge Instructions (Signed)
Follow-up with your cardiologist, Dr. Herbie Baltimore. Please return to the emergency department if you have significant worsening chest pain, shortness of breath, at this time there is no evidence of heart attack or worsening heart disease, the cardiologist feels comfortable with you following up outpatient.

## 2023-04-29 NOTE — ED Provider Notes (Signed)
[place] EMERGENCY DEPARTMENT AT [facility] Provider Note   CSN: [id number] Arrival date & time: 04/29/23  1303     History  Chief Complaint  Patient presents with   Chest Pain    Hannah [name] is a 57 y.o. female with PMH significant for MS, CVA, HLD, diabtetes, HTN, CAD s/p PCI x 2, anemia, ESRD on dialysis who presents with concern for central chest pain that started earlier today after completing dialysis.  Patient reports that she had sharp pain in the chest that radiated to her right ear.  She reports that it resolved after taking nitroglycerin, but did return a few minutes ago after arriving in the emergency department.  She denies any pressure, she reports that she has been having this intermittently for the last 2 weeks.   Chest Pain      Home Medications Prior to Admission medications   Medication Sig Start Date End Date Taking? Authorizing Provider  acetaminophen (TYLENOL) 325 MG tablet Take 650 mg by mouth every 6 (six) hours as needed for moderate pain.   Yes [provider]  amLODipine (NORVASC) 10 MG tablet Take 1 tablet (10 mg total) by mouth daily. Except on dialysis days do not take a dose. 06/08/22  Yes Rollene Rotunda, MD  azaTHIOprine (IMURAN) 50 MG tablet Take 1 tablet by mouth daily. New medication, hasn't been picked up from Pharmacy. 04/29/2023. 04/26/23 04/25/24 Yes [provider]  calcium acetate (PHOSLO) 667 MG capsule Take 1,334 mg by mouth in the morning, at noon, and at bedtime. 11/11/20  Yes [provider]  carvedilol (COREG) 25 MG tablet Take 1 tablet (25 mg total) by mouth 2 (two) times daily with a meal. And as directed. 06/08/22  Yes Rollene Rotunda, MD  clopidogrel (PLAVIX) 75 MG tablet Take 1 tablet (75 mg total) by mouth daily. 11/03/20  Yes Love, Evlyn Kanner, PA-C  diclofenac Sodium (VOLTAREN) 1 % GEL Apply 2 g topically 4 (four) times daily. To leg Patient taking differently: Apply 2 g topically 4 (four)  times daily as needed (pain). To leg 11/03/20  Yes Love, Evlyn Kanner, PA-C  famotidine (PEPCID) 20 MG tablet Take 1 tablet (20 mg total) by mouth daily. 11/03/20  Yes Love, Evlyn Kanner, PA-C  insulin degludec (TRESIBA FLEXTOUCH) 100 UNIT/ML FlexTouch Pen Inject 12 Units into the skin at bedtime. Patient taking differently: Inject 8-12 Units into the skin at bedtime. Sliding scale 11/03/20  Yes Love, Evlyn Kanner, PA-C  insulin lispro (HUMALOG) 100 UNIT/ML KwikPen Inject 5 Units into the skin 3 (three) times daily. Patient taking differently: Inject 6 Units into the skin 3 (three) times daily. 11/04/20  Yes Love, Evlyn Kanner, PA-C  lidocaine-prilocaine (EMLA) cream Apply 1 Application topically daily as needed (port access). 03/02/22  Yes [provider]  melatonin 5 MG TABS Take 5 mg by mouth at bedtime as needed (sleep).   Yes [provider]  nitroGLYCERIN (NITROSTAT) 0.4 MG SL tablet Place 1 tablet (0.4 mg total) under the tongue every 5 (five) minutes as needed for chest pain. Patient taking differently: Place 0.4 mg under the tongue every 5 (five) minutes as needed for chest pain. Patient took 1 tablet today, and 1 on 04/25/2023, and 1 tablet on 04/22/2023. 06/25/20  Yes Angiulli, Mcarthur Rossetti, PA-C  ondansetron (ZOFRAN) 4 MG tablet Take 4 mg by mouth 2 (two) times daily. 01/07/23  Yes [provider]  polyethylene glycol (MIRALAX / GLYCOLAX) 17 g packet Take 17 g by  mouth daily. Patient taking differently: Take 17 g by mouth daily as needed for moderate constipation. 11/04/20  Yes Love, Evlyn Kanner, PA-C  rosuvastatin (CRESTOR) 20 MG tablet Take 20 mg by mouth daily. 02/05/21  Yes [provider]  senna-docusate (SENOKOT-S) 8.6-50 MG tablet Take 1 tablet by mouth 2 (two) times daily as needed for moderate constipation. Patient taking differently: Take 1 tablet by mouth 2 (two) times daily. 11/03/20  Yes Love, Evlyn Kanner, PA-C  Continuous Blood Gluc Sensor (FREESTYLE LIBRE 2 SENSOR) MISC USE  AS DIRECTED CHANGE EVERY 14 DAYS 12/30/20   [provider]  metoCLOPramide (REGLAN) 5 MG tablet Take 5 mg by mouth 3 (three) times daily before meals. 09/16/21   [provider]      Allergies    Ibuprofen, Influenza vaccines, Pneumococcal vaccine, Promethazine, Ambien [zolpidem], Codeine, Cyclobenzaprine, Hydrocodone-acetaminophen, Tape, Tramadol hcl, Wound dressing adhesive, and Oxycodone    Review of Systems   Review of Systems  Cardiovascular:  Positive for chest pain.  All other systems reviewed and are negative.   Physical Exam Updated Vital Signs BP (!) 183/69   Pulse 83   Temp 98.8 F (37.1 C)   Resp (!) 9   Ht 5\' 2"  (1.575 m)   Wt 46.7 kg   SpO2 100%   BMI 18.84 kg/m  Physical Exam Vitals and nursing note reviewed.  Constitutional:      General: She is not in acute distress.    Appearance: Normal appearance.  HENT:     Head: Normocephalic and atraumatic.  Eyes:     General:        Right eye: No discharge.        Left eye: No discharge.  Cardiovascular:     Rate and Rhythm: Normal rate and regular rhythm.     Heart sounds: No murmur heard.    No friction rub. No gallop.  Pulmonary:     Effort: Pulmonary effort is normal.     Breath sounds: Normal breath sounds. No wheezing.     Comments: Some mild exp wheezing and cackles in bilateral lung fields, right worse than left Abdominal:     General: Bowel sounds are normal.     Palpations: Abdomen is soft.  Skin:    General: Skin is warm and dry.     Capillary Refill: Capillary refill takes less than 2 seconds.  Neurological:     Mental Status: She is alert and oriented to person, place, and time.  Psychiatric:        Mood and Affect: Mood normal.        Behavior: Behavior normal.     ED Results / Procedures / Treatments   Labs (all labs ordered are listed, but only abnormal results are displayed) Labs Reviewed  BASIC METABOLIC PANEL - Abnormal; Notable for the following components:       Result Value   Glucose, Bld 186 (*)    Creatinine, Ser 2.42 (*)    Calcium 8.4 (*)    GFR, Estimated 22 (*)    All other components within normal limits  CBC - Abnormal; Notable for the following components:   RBC 2.99 (*)    Hemoglobin 8.5 (*)    HCT 26.0 (*)    Platelets 112 (*)    All other components within normal limits  BRAIN NATRIURETIC PEPTIDE - Abnormal; Notable for the following components:   B Natriuretic Peptide 200.6 (*)    All other components within normal limits  TROPONIN I (HIGH SENSITIVITY) - Abnormal; Notable for the following components:   Troponin I (High Sensitivity) 21 (*)    All other components within normal limits  TROPONIN I (HIGH SENSITIVITY)    EKG None  Radiology DG Chest 2 View Result Date: 04/29/2023 CLINICAL DATA:  Acute central chest pain.  Recent dialysis. EXAM: CHEST - 2 VIEW COMPARISON:  None Available. FINDINGS: The heart size and mediastinal contours are within normal limits. Both lungs are clear. The visualized skeletal structures are unremarkable. IMPRESSION: No active cardiopulmonary disease. Electronically Signed   By: Danae Orleans M.D.   On: 04/29/2023 16:29    Procedures Procedures    Medications Ordered in ED Medications  nitroGLYCERIN (NITROSTAT) SL tablet 0.4 mg (has no administration in time range)    ED Course/ Medical Decision Making/ A&P                                 Medical Decision Making Amount and/or Complexity of Data Reviewed Labs: ordered. Radiology: ordered.  Risk Prescription drug management.   This patient is a 57 y.o. female  who presents to the ED for concern of chest pain, shob.   Differential diagnoses prior to evaluation: The emergent differential diagnosis includes, but is not limited to,  ACS, AAS, PE, Mallory-Weiss, Boerhaave's, Pneumonia, acute bronchitis, asthma or COPD exacerbation, anxiety, MSK pain or traumatic injury to the chest, acid reflux versus other  . This is not an exhaustive  differential.   Past Medical History / Co-morbidities / Social History: MS, CVA, HLD, diabtetes, HTN, CAD s/p PCI x 2, anemia, ESRD on dialysis   Additional history: Chart reviewed. Pertinent results include: I reviewed her most recent echo from 2022, as well as lab work, imaging from previous emergency department visits  Physical Exam: Physical exam performed. The pertinent findings include: Quite hypertensive on arrival, blood pressure 207/78, slightly improved on recheck, 171/70.  Otherwise vital signs stable, normal oxygen saturation on room air, afebrile, not tachycardic, and normal respiratory rate, prior to discharge she did have a few instances of lower respiratory rate around 8 or 9 respirations per minute but when evaluated in the room her respiratory rate was averaging around 12.  Lab Tests/Imaging studies: I personally interpreted labs/imaging and the pertinent results include: CBC notable for anemia, hemoglobin 8.5, slightly worse than recent baseline, likely secondary to anemia of chronic disease, mild thrombocytopenia as well, platelets 112.  Her BMP is overall unremarkable, elevated creatinine at 2.42 is improved compared to recent baseline, mild hypocalcemia, 8.4.  Initial troponin is 17, with a delta at 21, mildly elevated BNP at 200.6.Marland Kitchen  I independently interpreted plain film chest x-ray which shows no evidence of acute intrathoracic abnormality.  I agree with the radiologist interpretation.  Cardiac monitoring: EKG obtained and interpreted by myself and attending physician which shows: Normal sinus rhythm with PVCs, no significant change from recent baseline   Consults: I spoke with the cardiologist on-call, Dr. Wyline Mood given patient's chest pain, and mildly elevated troponin, she reports with no changes in EKG, overall reassuring description of chest pain, and patient being chest pain-free at time of reevaluation that she is comfortable with her to follow-up with her  cardiologist, I discussed this plan with the patient who agrees to close cardiology follow-up, no need for additional workup in the ED today   Disposition: After consideration of the diagnostic results and the patients response to treatment, I feel  that patient is stable for discharge at this time, plan for cardiology follow-up as discussed above.   emergency department workup does not suggest an emergent condition requiring admission or immediate intervention beyond what has been performed at this time. The plan is: as above. The patient is safe for discharge and has been instructed to return immediately for worsening symptoms, change in symptoms or any other concerns.  Final Clinical Impression(s) / ED Diagnoses Final diagnoses:  Chest pain, unspecified type    Rx / DC Orders ED Discharge Orders     None         Olene Floss, PA-C 04/29/23 1810    Royanne Foots, DO 05/01/23 2055

## 2023-04-29 NOTE — ED Notes (Signed)
Patient denied pain and shortness of breath

## 2023-04-30 DIAGNOSIS — R079 Chest pain, unspecified: Secondary | ICD-10-CM | POA: Diagnosis not present

## 2023-05-02 DIAGNOSIS — D689 Coagulation defect, unspecified: Secondary | ICD-10-CM | POA: Diagnosis not present

## 2023-05-02 DIAGNOSIS — Z992 Dependence on renal dialysis: Secondary | ICD-10-CM | POA: Diagnosis not present

## 2023-05-02 DIAGNOSIS — D509 Iron deficiency anemia, unspecified: Secondary | ICD-10-CM | POA: Diagnosis not present

## 2023-05-02 DIAGNOSIS — R519 Headache, unspecified: Secondary | ICD-10-CM | POA: Diagnosis not present

## 2023-05-02 DIAGNOSIS — N186 End stage renal disease: Secondary | ICD-10-CM | POA: Diagnosis not present

## 2023-05-02 DIAGNOSIS — E1122 Type 2 diabetes mellitus with diabetic chronic kidney disease: Secondary | ICD-10-CM | POA: Diagnosis not present

## 2023-05-02 DIAGNOSIS — N2581 Secondary hyperparathyroidism of renal origin: Secondary | ICD-10-CM | POA: Diagnosis not present

## 2023-05-02 DIAGNOSIS — D631 Anemia in chronic kidney disease: Secondary | ICD-10-CM | POA: Diagnosis not present

## 2023-05-04 DIAGNOSIS — N2581 Secondary hyperparathyroidism of renal origin: Secondary | ICD-10-CM | POA: Diagnosis not present

## 2023-05-04 DIAGNOSIS — Z992 Dependence on renal dialysis: Secondary | ICD-10-CM | POA: Diagnosis not present

## 2023-05-04 DIAGNOSIS — D509 Iron deficiency anemia, unspecified: Secondary | ICD-10-CM | POA: Diagnosis not present

## 2023-05-04 DIAGNOSIS — D631 Anemia in chronic kidney disease: Secondary | ICD-10-CM | POA: Diagnosis not present

## 2023-05-04 DIAGNOSIS — D689 Coagulation defect, unspecified: Secondary | ICD-10-CM | POA: Diagnosis not present

## 2023-05-04 DIAGNOSIS — E1122 Type 2 diabetes mellitus with diabetic chronic kidney disease: Secondary | ICD-10-CM | POA: Diagnosis not present

## 2023-05-04 DIAGNOSIS — R519 Headache, unspecified: Secondary | ICD-10-CM | POA: Diagnosis not present

## 2023-05-04 DIAGNOSIS — N186 End stage renal disease: Secondary | ICD-10-CM | POA: Diagnosis not present

## 2023-05-05 ENCOUNTER — Other Ambulatory Visit: Payer: Self-pay

## 2023-05-05 ENCOUNTER — Ambulatory Visit (HOSPITAL_COMMUNITY)
Admission: RE | Admit: 2023-05-05 | Discharge: 2023-05-05 | Disposition: A | Discharge status: Home / Self Care | Payer: Medicare Other | Attending: Nephrology | Admitting: Nephrology

## 2023-05-05 ENCOUNTER — Encounter (HOSPITAL_COMMUNITY)
Admission: RE | Disposition: A | Discharge status: Home / Self Care | Payer: Self-pay | Source: Home / Self Care | Attending: Nephrology

## 2023-05-05 DIAGNOSIS — I2510 Atherosclerotic heart disease of native coronary artery without angina pectoris: Secondary | ICD-10-CM | POA: Insufficient documentation

## 2023-05-05 DIAGNOSIS — Z955 Presence of coronary angioplasty implant and graft: Secondary | ICD-10-CM | POA: Diagnosis not present

## 2023-05-05 DIAGNOSIS — E1122 Type 2 diabetes mellitus with diabetic chronic kidney disease: Secondary | ICD-10-CM | POA: Insufficient documentation

## 2023-05-05 DIAGNOSIS — Z8673 Personal history of transient ischemic attack (TIA), and cerebral infarction without residual deficits: Secondary | ICD-10-CM | POA: Diagnosis not present

## 2023-05-05 DIAGNOSIS — Z992 Dependence on renal dialysis: Secondary | ICD-10-CM | POA: Insufficient documentation

## 2023-05-05 DIAGNOSIS — Z79899 Other long term (current) drug therapy: Secondary | ICD-10-CM | POA: Insufficient documentation

## 2023-05-05 DIAGNOSIS — Y832 Surgical operation with anastomosis, bypass or graft as the cause of abnormal reaction of the patient, or of later complication, without mention of misadventure at the time of the procedure: Secondary | ICD-10-CM | POA: Insufficient documentation

## 2023-05-05 DIAGNOSIS — N250 Renal osteodystrophy: Secondary | ICD-10-CM | POA: Diagnosis not present

## 2023-05-05 DIAGNOSIS — T82858A Stenosis of vascular prosthetic devices, implants and grafts, initial encounter: Secondary | ICD-10-CM | POA: Diagnosis not present

## 2023-05-05 DIAGNOSIS — Z794 Long term (current) use of insulin: Secondary | ICD-10-CM | POA: Insufficient documentation

## 2023-05-05 DIAGNOSIS — G35 Multiple sclerosis: Secondary | ICD-10-CM | POA: Diagnosis not present

## 2023-05-05 DIAGNOSIS — D631 Anemia in chronic kidney disease: Secondary | ICD-10-CM | POA: Diagnosis not present

## 2023-05-05 DIAGNOSIS — N186 End stage renal disease: Secondary | ICD-10-CM | POA: Diagnosis not present

## 2023-05-05 DIAGNOSIS — I120 Hypertensive chronic kidney disease with stage 5 chronic kidney disease or end stage renal disease: Secondary | ICD-10-CM | POA: Diagnosis not present

## 2023-05-05 DIAGNOSIS — I871 Compression of vein: Secondary | ICD-10-CM | POA: Diagnosis not present

## 2023-05-05 HISTORY — PX: A/V FISTULAGRAM: CATH118298

## 2023-05-05 HISTORY — PX: PERIPHERAL VASCULAR BALLOON ANGIOPLASTY: CATH118281

## 2023-05-05 SURGERY — A/V FISTULAGRAM
Anesthesia: LOCAL

## 2023-05-05 MED ORDER — SODIUM CHLORIDE 0.9% FLUSH: 10.0000 mL | Freq: Two times a day (BID) | INTRAVENOUS | Status: DC

## 2023-05-05 MED ORDER — MIDAZOLAM HCL 2 MG/2ML IJ SOLN
INTRAMUSCULAR | Status: DC | PRN
  Administered 2023-05-05: 1 mg via INTRAVENOUS

## 2023-05-05 MED ORDER — SODIUM CHLORIDE 0.9% FLUSH: 3.0000 mL | INTRAVENOUS | Status: DC | PRN

## 2023-05-05 MED ORDER — FENTANYL CITRATE (PF) 100 MCG/2ML IJ SOLN
INTRAMUSCULAR | Status: DC | PRN
  Administered 2023-05-05: 50 ug via INTRAVENOUS

## 2023-05-05 MED ORDER — ONDANSETRON HCL 4 MG/2ML IJ SOLN: 4.0000 mg | Freq: Four times a day (QID) | INTRAMUSCULAR | Status: DC | PRN

## 2023-05-05 MED ORDER — LIDOCAINE HCL (PF) 1 % IJ SOLN
INTRAMUSCULAR | Status: DC | PRN
  Administered 2023-05-05: 2 mL via SUBCUTANEOUS

## 2023-05-05 MED ORDER — MIDAZOLAM HCL 2 MG/2ML IJ SOLN
INTRAMUSCULAR | Status: AC
  Filled 2023-05-05: qty 2

## 2023-05-05 MED ORDER — LIDOCAINE HCL (PF) 1 % IJ SOLN
INTRAMUSCULAR | Status: AC
  Filled 2023-05-05: qty 30

## 2023-05-05 MED ORDER — ACETAMINOPHEN 325 MG PO TABS: 650.0000 mg | ORAL_TABLET | ORAL | Status: DC | PRN

## 2023-05-05 MED ORDER — HEPARIN (PORCINE) IN NACL 1000-0.9 UT/500ML-% IV SOLN
INTRAVENOUS | Status: DC | PRN
  Administered 2023-05-05: 500 mL

## 2023-05-05 MED ORDER — FENTANYL CITRATE (PF) 100 MCG/2ML IJ SOLN
INTRAMUSCULAR | Status: AC
  Filled 2023-05-05: qty 2

## 2023-05-05 MED ORDER — IODIXANOL 320 MG/ML IV SOLN
INTRAVENOUS | Status: DC | PRN
  Administered 2023-05-05: 10 mL

## 2023-05-05 SURGICAL SUPPLY — 14 items
BAG SNAP BAND KOVER 36X36 (MISCELLANEOUS) ×2 IMPLANT
BALLN MUSTANG 10.0X40 75 (BALLOONS) ×2
BALLN MUSTANG 8X80X75 (BALLOONS) ×2
BALLOON MUSTANG 10.0X40 75 (BALLOONS) IMPLANT
BALLOON MUSTANG 8X80X75 (BALLOONS) IMPLANT
COVER DOME SNAP 22 D (MISCELLANEOUS) ×2 IMPLANT
GUIDEWIRE ANGLED .035X150CM (WIRE) IMPLANT
MAT PREVALON FULL STRYKER (MISCELLANEOUS) IMPLANT
PROTECTION STATION PRESSURIZED (MISCELLANEOUS) ×2
SHEATH PINNACLE R/O II 6F 4CM (SHEATH) IMPLANT
STATION PROTECTION PRESSURIZED (MISCELLANEOUS) ×2 IMPLANT
SYR MEDALLION 10ML (SYRINGE) IMPLANT
TRAY PV CATH (CUSTOM PROCEDURE TRAY) ×2 IMPLANT
WIRE STARTER BENTSON 035X150 (WIRE) IMPLANT

## 2023-05-05 NOTE — Op Note (Signed)
Patient presents with decreased access flows and prolonged cannulation site bleeding from her right upper arm straight graft; her last procedure was a 9 mm angioplasty of the central veins as well as the outflow edge of the VA stent in the axillary vein in September 2024.  This is actually a bypass graft from the brachial artery to the remnant outflow basilic vein.  On exam the RUA straight AVG is hyperpulsatile.   Summary:  1) Successful angiogram of a right upper arm straight AVG   with evidence of a 80% innominate vein stenosis treated to 10% with a 10 mm Mustang fully effaced at approximately 10 atm of pressure.   2)   50% outflow at all stent stenosis treated to 10% with a 8 mm Mustang followed by 10 mm Mustang angioplasty.  Of note the 10 mm segment was not fully effaced inside the stent itself; the stent is either 8 or 9 mm stent.   2) The body of the graft   and inflow were widely patent. 3) This right upper arm straight graft remains amenable to future percutaneous intervention.    Description of procedure: The right upper arm was prepped and draped in the usual fashion. The right upper arm straight AVG was cannulated ([id number]) in the arterial limb of the graft in an antegrade direction with an 18G Angiocath needle and then a 6 Fr sheath was inserted by guidewire exchange technique. The angiogram revealed a patent body of the graft with a 50% venous anastomosis outflow edge of stent stenosis.  Initially the body of the graft was not seen for an unknown reason but on retrograde angiogram the body was widely patent.  Inflow, SVC were patent was a 70% innominate vein stenosis with retrograde flow up the jugular vein noted.  A guidewire was easily advanced past the outflow stenosis and parked in the right atrium.  I then advanced an 8 x 8 Mustang angioplasty balloon through the antegrade sheath over the guidewire to the level of the venous anastomosis to axillary vein stenosis. Venous angioplasty was  performed to 100% balloon effacement with approximately 12 ATM of pressure via a hand syringe assembly.  Currently I had to use a 10 x 4 Mustang to the level of the outflow edge of stent axillary vein stenosis and full balloon effacement was achieved outside of the stent.  Subsequently there was 10% residual stenosis.  This time I advanced a 10 x 4 Mustang to the level of the innominate vein stenosis; central venous angioplasty was performed by hand syringe assembly to approximately 15 atm of pressure with full balloon effacement achieved.  Final arteriogram and completion venogram revealed no evidence of extravasation or dissection, more rapid access flows through the graft and 10% residual stenosis at all levels of stenosis with no evidence of extravasation or dissection.    Hemostasis: A 3-0 ethilon purse string suture was placed at the cannulation site on removal of the sheath.  Sedation: 1 mg Versed, Fentanyl.  Contrast. 10 mL  Monitoring: Because of the patient's comorbid conditions and sedation during the procedure, continuous EKG monitoring and O2 saturation monitoring was performed throughout the procedure by the RN. There were no abnormal arrhythmias encountered.  Complications: None.   Diagnoses: I87.1 Stricture of vein  N18.6 ESRD T82.858A Stricture of access  Procedure Coding:  [phone] Cannulation and angiogram of fistula, venous angioplasty (AVG VA) innominate vein stenosis (innominate vein is part of the central veins defined by CMS to be in a  certain different circuit than the dialysis access) [id number]  [phone] Contrast  Recommendations:  1. Continue to cannulate the fistula with 15G needles.  2. Refer back for problems with flows. 3. Remove the suture next treatment.   Discharge: The patient was discharged home in stable condition. The patient was given education regarding the care of the dialysis access AVF and specific instructions in case of any problems.

## 2023-05-05 NOTE — H&P (Addendum)
Chief Complaint: Decreased flows  Interval H&P  The patient has presented today for an angiogram/ angioplasty; patient is followed at [facility] with Dr. Thedore Mins.  Various methods of treatment have been discussed with the patient.  After consideration of risk, benefits and other options for treatment, the patient has consented to a angiogram/ angioplasty with  possible stent placement.   Risks of angiogram with potential angioplasty and stenting if needed.contrast reaction, extravasation/ bleeding, dissection, hypotension and death were explained to the patient.  The patient's history has been reviewed and the patient has been examined, no changes in status.  Stable for angiogram/angioplasty  I have reviewed the patient's chart and labs.  Questions were answered to the patient's satisfaction.  Assessment/Plan: ESRD dialyzing at [facility] MWF regimen with last dialysis Wednesday. Decreased access flows and prolonged bleeding-right upper arm AV jump graft brachial artery to the upper arm basilic vein by Dr. Chestine Spore on May 28, 2022; planning on angiogram with possibly angioplasty. Renal osteodystrophy - continue binders per home regimen. Anemia - managed with ESA's and IV iron at dialysis center. HTN - resume home regimen. H/o CVA x4 right upper right-sided hemiparesis.   CASHD compensated Multiple sclerosis DM  HPI: Hannah [name] is an 57 y.o. female hypertension, diabetes, CVA x 4 with right-sided hemiparesis, CASHD, ESRD dialyzing Monday was and Fridays at [facility] with Dr. Thedore Mins.  Patient's last dialysis was a full treatment on Wednesday.  Patient being referred for prolonged bleeding and decreased access flows in the right upper arm AV graft placed by Dr. Chestine Spore.  ROS Per HPI.  Chemistry and CBC: Creatinine, Ser  Date/Time Value Ref Range Status  04/29/2023 01:55 PM 2.42 (H) 0.44 - 1.00 mg/dL Final  53/66/4403 47:42 AM 3.64 (H) 0.44 - 1.00 mg/dL Final  59/56/3875 64:33 PM 3.42 (H) 0.44  - 1.00 mg/dL Final  29/51/8841 66:06 AM 4.90 (H) 0.44 - 1.00 mg/dL Final  30/16/0109 32:35 PM 4.80 (H) 0.44 - 1.00 mg/dL Final  57/32/2025 42:70 PM 2.60 (H) 0.44 - 1.00 mg/dL Final  62/37/6283 15:17 PM 2.38 (H) 0.44 - 1.00 mg/dL Final  61/60/7371 06:26 AM 4.70 (H) 0.44 - 1.00 mg/dL Final  94/85/4627 03:50 PM 2.27 (H) 0.44 - 1.00 mg/dL Final  09/38/1829 93:71 AM 2.80 (H) 0.44 - 1.00 mg/dL Final  69/67/8938 10:17 AM 2.39 (H) 0.44 - 1.00 mg/dL Final  51/06/5850 77:82 PM 1.10 (H) 0.44 - 1.00 mg/dL Final  42/35/3614 43:15 PM 1.28 (H) 0.44 - 1.00 mg/dL Final  40/12/6759 95:09 AM 2.67 (H) 0.44 - 1.00 mg/dL Final  32/67/1245 80:99 AM 2.02 (H) 0.44 - 1.00 mg/dL Final  83/38/2505 39:76 AM 5.13 (H) 0.44 - 1.00 mg/dL Final  73/41/9379 02:40 AM 3.70 (H) 0.44 - 1.00 mg/dL Final  97/35/3299 24:26 PM 2.42 (H) 0.44 - 1.00 mg/dL Final  83/41/9622 29:79 PM 4.97 (H) 0.44 - 1.00 mg/dL Final  89/21/1941 74:08 PM 4.34 (H) 0.44 - 1.00 mg/dL Final  14/48/1856 31:49 PM 5.39 (H) 0.44 - 1.00 mg/dL Final  70/26/3785 88:50 PM 3.93 (H) 0.44 - 1.00 mg/dL Final  27/74/1287 86:76 AM 3.43 (H) 0.44 - 1.00 mg/dL Final    Comment:    DELTA CHECK NOTED  10/21/2020 05:31 AM 6.63 (H) 0.44 - 1.00 mg/dL Final  72/01/4708 62:83 AM 5.61 (H) 0.44 - 1.00 mg/dL Final    Comment:    REPEATED TO VERIFY  10/19/2020 05:54 AM 3.68 (H) 0.44 - 1.00 mg/dL Final    Comment:    DIALYSIS  10/18/2020  01:56 AM 6.61 (H) 0.44 - 1.00 mg/dL Final  16/02/9603 54:09 AM 5.49 (H) 0.44 - 1.00 mg/dL Final    Comment:    DELTA CHECK NOTED  10/16/2020 03:36 AM 3.23 (H) 0.44 - 1.00 mg/dL Final  81/19/1478 29:56 AM 4.81 (H) 0.44 - 1.00 mg/dL Final  21/30/8657 84:69 AM 3.36 (H) 0.44 - 1.00 mg/dL Final    Comment:    DELTA CHECK NOTED  10/13/2020 01:04 AM 6.54 (H) 0.44 - 1.00 mg/dL Final  62/95/2841 32:44 AM 4.76 (H) 0.44 - 1.00 mg/dL Final    Comment:    DELTA CHECK NOTED  10/11/2020 01:11 AM 2.85 (H) 0.44 - 1.00 mg/dL Final    Comment:    DELTA  CHECK NOTED  10/10/2020 12:38 AM 5.32 (H) 0.44 - 1.00 mg/dL Final    Comment:    DELTA CHECK NOTED  10/09/2020 02:33 AM 3.46 (H) 0.44 - 1.00 mg/dL Final    Comment:    DELTA CHECK NOTED  10/08/2020 01:06 AM 5.50 (H) 0.44 - 1.00 mg/dL Final  05/19/7251 66:44 AM 4.04 (H) 0.44 - 1.00 mg/dL Final  03/47/4259 56:38 AM 5.35 (H) 0.44 - 1.00 mg/dL Final  75/64/3329 51:88 AM 4.03 (H) 0.44 - 1.00 mg/dL Final    Comment:    DELTA CHECK NOTED  10/04/2020 04:23 AM 2.62 (H) 0.44 - 1.00 mg/dL Final    Comment:    DELTA CHECK NOTED  10/03/2020 03:27 AM 3.90 (H) 0.44 - 1.00 mg/dL Final    Comment:    DELTA CHECK NOTED  10/02/2020 04:29 AM 2.58 (H) 0.44 - 1.00 mg/dL Final  41/66/0630 16:01 AM 2.62 (H) 0.44 - 1.00 mg/dL Final    Comment:    DELTA CHECK NOTED  09/30/2020 04:26 AM 3.75 (H) 0.44 - 1.00 mg/dL Final    Comment:    DELTA CHECK NOTED  09/29/2020 12:37 AM 5.78 (H) 0.44 - 1.00 mg/dL Final  09/32/3557 32:20 AM 5.45 (H) 0.44 - 1.00 mg/dL Final  25/42/7062 37:62 AM 5.45 (H) 0.44 - 1.00 mg/dL Final  83/15/1761 60:73 PM 5.34 (H) 0.44 - 1.00 mg/dL Final  71/10/2692 85:46 AM 5.21 (H) 0.44 - 1.00 mg/dL Final  27/07/5007 38:18 AM 4.71 (H) 0.44 - 1.00 mg/dL Final  29/93/7169 67:89 AM 4.88 (H) 0.44 - 1.00 mg/dL Final   Recent Labs  Lab 04/29/23 1355  NA 137  K 4.4  CL 98  CO2 29  GLUCOSE 186*  BUN 10  CREATININE 2.42*  CALCIUM 8.4*   Recent Labs  Lab 04/29/23 1355  WBC 5.3  HGB 8.5*  HCT 26.0*  MCV 87.0  PLT 112*   Liver Function Tests: No results for input(s): "AST", "ALT", "ALKPHOS", "BILITOT", "PROT", "ALBUMIN" in the last 168 hours. No results for input(s): "LIPASE", "AMYLASE" in the last 168 hours. No results for input(s): "AMMONIA" in the last 168 hours. Cardiac Enzymes: No results for input(s): "CKTOTAL", "CKMB", "CKMBINDEX", "TROPONINI" in the last 168 hours. Iron Studies: No results for input(s): "IRON", "TIBC", "TRANSFERRIN", "FERRITIN" in the last 72  hours. PT/INR: @LABRCNTIP (inr:5)  Xrays/Other Studies: )No results found for this or any previous visit (from the past 48 hours). No results found.  PMH:   Past Medical History:  Diagnosis Date   CAD S/P PCI x 2 2009   MI 2009 -> PCI x 2 ([place]); 01/2016 Nuc ST Nonischemic with Normal EF   Cerebral infarction (HCC) 07/12/2017   2009 first one, has had a total of 4 stokes- last 2021  Chronic kidney disease    Hemo M-W- F   Diabetes type 2, controlled (HCC)    Hemiparesis affecting right side as late effect of cerebrovascular accident (CVA) (HCC)    Hyperlipidemia    Hypertension    Intractable vomiting with nausea 07/12/2017   Multiple sclerosis (HCC) 2009   Myocardial infarction [facility]) 2009   Stroke [facility])     PSH:   Past Surgical History:  Procedure Laterality Date   A/V FISTULAGRAM Right 02/12/2021   Procedure: A/V FISTULAGRAM;  Surgeon: Cephus Shelling, MD;  Location: [facility] INVASIVE CV LAB;  Service: Cardiovascular;  Laterality: Right;   A/V FISTULAGRAM Right 05/20/2022   Procedure: A/V Fistulagram;  Surgeon: Cephus Shelling, MD;  Location: [facility] INVASIVE CV LAB;  Service: Cardiovascular;  Laterality: Right;   APPENDECTOMY     AV FISTULA PLACEMENT Right 10/02/2020   Procedure: RIGHT ARTERIOVENOUS (AV) FISTULA CREATION;  Surgeon: Maeola Harman, MD;  Location: [facility] OR;  Service: Vascular;  Laterality: Right;   AV FISTULA PLACEMENT Right 05/28/2022   Procedure: RIGHT ARM ARTERIOVENOUS (AV) FISTULA REVISION WITH GORETEX GRAFT;  Surgeon: Cephus Shelling, MD;  Location: [facility] OR;  Service: Vascular;  Laterality: Right;   BASCILIC VEIN TRANSPOSITION Right 11/24/2020   Procedure: SECOND STAGE RIGHT ARM BASCILIC VEIN TRANSPOSITION;  Surgeon: Cephus Shelling, MD;  Location: [facility] OR;  Service: Vascular;  Laterality: Right;   CYSTOSTOMY W/ BLADDER BIOPSY     INSERTION OF DIALYSIS CATHETER Right 05/28/2022   Procedure: INSERTION OF TUNNELED DIALYSIS CATHETER IN  THE RIGHT INTERNAL JUGULAR;  Surgeon: Cephus Shelling, MD;  Location: [facility] OR;  Service: Vascular;  Laterality: Right;   IR FLUORO GUIDE CV LINE RIGHT  09/29/2020   IR US GUIDE VASC ACCESS RIGHT  09/29/2020   NM MYOVIEW LTD  12/25/2015   [place], [place] - NORMAL /NEGATIVE for ischemia. Normal LV Fxn   OVARIAN CYST SURGERY     PERCUTANEOUS CORONARY STENT INTERVENTION (PCI-S)  2009   [place], [place] - 2 stents in settng of MI -- unknown details   PERIPHERAL VASCULAR BALLOON ANGIOPLASTY Right 02/12/2021   Procedure: PERIPHERAL VASCULAR BALLOON ANGIOPLASTY;  Surgeon: Cephus Shelling, MD;  Location: [facility] INVASIVE CV LAB;  Service: Cardiovascular;  Laterality: Right;  UPPER ARM FISTULA   PERIPHERAL VASCULAR BALLOON ANGIOPLASTY  05/20/2022   Procedure: PERIPHERAL VASCULAR BALLOON ANGIOPLASTY;  Surgeon: Cephus Shelling, MD;  Location: [facility] INVASIVE CV LAB;  Service: Cardiovascular;;  rt arm fistula site   PERIPHERAL VASCULAR BALLOON ANGIOPLASTY Right 01/28/2022   Procedure: PERIPHERAL VASCULAR BALLOON ANGIOPLASTY;  Surgeon: Cephus Shelling, MD;  Location: [facility] INVASIVE CV LAB;  Service: Cardiovascular;  Laterality: Right;  Arm fistula   TRANSTHORACIC ECHOCARDIOGRAM  07/11/2017   a) 12/2015, [facility], [place]): Moderate Conc LVH w/ sigmoid septum.  No LVOT obstruction.  Hyperdynamic LV fxn - EF 65 to 70%.  GR 1 DD & high LAP/LVEDP.  AoV Sclerosis - No AS;; b) 06/2017: [facility]): Normal LV Fxn - EF 60%.  Abnormal septal motion.  Mild LVH mild MAC.  Negative bubble study (for CVA)   TRANSTHORACIC ECHOCARDIOGRAM  12/2020   a) 05/2020: EF 60-65%. No RWMA. Gr2DD. Sm posterior pericardial effusion. No AS.; b) 09/2020: EF 70-75%.  Hyperdynamic.  No RWMA. ? D Fxn/LAP.  Mildly elevated PAP.  Pericardial effusion.  Mild ao V sclerosis cyst.; c) 12/2020 (Limited): EF 65-70%. No RWMA. Gr1-2 DD. Mild Conc LVH- high LAP. Nl PAP. Circumferential effusion w/o tamponade. AoV Sclerosis -  no AS. Nl RAP.    ULTRASOUND GUIDANCE FOR VASCULAR ACCESS Right 05/28/2022   Procedure: ULTRASOUND GUIDANCE FOR VASCULAR ACCESS;  Surgeon: Cephus Shelling, MD;  Location: [facility] OR;  Service: Vascular;  Laterality: Right;    Allergies:  Allergies  Allergen Reactions   Ibuprofen Swelling    Swelling to hands, feet and face    Influenza Vaccines Anaphylaxis and Swelling    fever   Pneumococcal Vaccine Anaphylaxis and Swelling    fever   Promethazine Other (See Comments)    hallucinations   Ambien [Zolpidem] Other (See Comments)    Unknown reaction   Codeine Itching   Cyclobenzaprine Other (See Comments)    hallucinations    Hydrocodone-Acetaminophen Itching   Tape Itching   Tramadol Hcl Other (See Comments)    hallucinations   Wound Dressing Adhesive Itching    Use paper tape   Oxycodone Nausea And Vomiting    Medications:   Prior to Admission medications   Medication Sig Start Date End Date Taking? Authorizing Provider  acetaminophen (TYLENOL) 325 MG tablet Take 650 mg by mouth every 6 (six) hours as needed for moderate pain.    [provider]  amLODipine (NORVASC) 10 MG tablet Take 1 tablet (10 mg total) by mouth daily. Except on dialysis days do not take a dose. 06/08/22   Rollene Rotunda, MD  azaTHIOprine (IMURAN) 50 MG tablet Take 1 tablet by mouth daily. New medication, hasn't been picked up from Pharmacy. 04/29/2023. 04/26/23 04/25/24  [provider]  calcium acetate (PHOSLO) 667 MG capsule Take 1,334 mg by mouth in the morning, at noon, and at bedtime. 11/11/20   [provider]  carvedilol (COREG) 25 MG tablet Take 1 tablet (25 mg total) by mouth 2 (two) times daily with a meal. And as directed. 06/08/22   Rollene Rotunda, MD  clopidogrel (PLAVIX) 75 MG tablet Take 1 tablet (75 mg total) by mouth daily. 11/03/20   Love, Evlyn Kanner, PA-C  Continuous Blood Gluc Sensor (FREESTYLE LIBRE 2 SENSOR) MISC USE AS DIRECTED CHANGE EVERY 14 DAYS 12/30/20   [provider]  diclofenac Sodium (VOLTAREN) 1 % GEL Apply 2 g topically 4 (four) times daily. To leg Patient taking differently: Apply 2 g topically 4 (four) times daily as needed (pain). To leg 11/03/20   Love, Evlyn Kanner, PA-C  famotidine (PEPCID) 20 MG tablet Take 1 tablet (20 mg total) by mouth daily. 11/03/20   Love, Evlyn Kanner, PA-C  insulin degludec (TRESIBA FLEXTOUCH) 100 UNIT/ML FlexTouch Pen Inject 12 Units into the skin at bedtime. Patient taking differently: Inject 8-12 Units into the skin at bedtime. Sliding scale 11/03/20   Love, Evlyn Kanner, PA-C  insulin lispro (HUMALOG) 100 UNIT/ML KwikPen Inject 5 Units into the skin 3 (three) times daily. Patient taking differently: Inject 6 Units into the skin 3 (three) times daily. 11/04/20   Love, Evlyn Kanner, PA-C  lidocaine-prilocaine (EMLA) cream Apply 1 Application topically daily as needed (port access). 03/02/22   [provider]  melatonin 5 MG TABS Take 5 mg by mouth at bedtime as needed (sleep).    [provider]  metoCLOPramide (REGLAN) 5 MG tablet Take 5 mg by mouth 3 (three) times daily before meals. 09/16/21   [provider]  nitroGLYCERIN (NITROSTAT) 0.4 MG SL tablet Place 1 tablet (0.4 mg total) under the tongue every 5 (five) minutes as needed for chest pain. Patient taking differently: Place 0.4 mg under the tongue every 5 (five) minutes  as needed for chest pain. Patient took 1 tablet today, and 1 on 04/25/2023, and 1 tablet on 04/22/2023. 06/25/20   Angiulli, Mcarthur Rossetti, PA-C  ondansetron (ZOFRAN) 4 MG tablet Take 4 mg by mouth 2 (two) times daily. 01/07/23   [provider]  polyethylene glycol (MIRALAX / GLYCOLAX) 17 g packet Take 17 g by mouth daily. Patient taking differently: Take 17 g by mouth daily as needed for moderate constipation. 11/04/20   Love, Evlyn Kanner, PA-C  rosuvastatin (CRESTOR) 20 MG tablet Take 20 mg by mouth daily. 02/05/21   [provider]  senna-docusate (SENOKOT-S) 8.6-50 MG  tablet Take 1 tablet by mouth 2 (two) times daily as needed for moderate constipation. Patient taking differently: Take 1 tablet by mouth 2 (two) times daily. 11/03/20   Jacquelynn Cree, PA-C    Discontinued Meds:   Medications Discontinued During This Encounter  Medication Reason   sodium chloride flush (NS) 0.9 % injection 3 mL    0.9 %  sodium chloride infusion     Social History:  reports that she has never smoked. She has never used smokeless tobacco. She reports that she does not currently use alcohol. She reports that she does not use drugs.  Family History:  No family history on file.  Blood pressure (!) 204/73, pulse 88, temperature 97.8 F (36.6 C), resp. rate 14, SpO2 99%. GEN: NAD, A&Ox3, NCAT HEENT: No conjunctival pallor, EOMI NECK: Supple, no thyromegaly LUNGS: CTA B/L no rales, rhonchi or wheezing CV: RRR, No M/R/G ABD: SNDNT +BS  EXT: No lower extremity edema ACCESS: Right upper arm AV graft       Martisha Toulouse, Len Blalock, MD 05/05/2023, 8:38 AM

## 2023-05-05 NOTE — Discharge Instructions (Signed)

## 2023-05-06 ENCOUNTER — Encounter (HOSPITAL_COMMUNITY): Payer: Self-pay | Admitting: Nephrology

## 2023-05-06 DIAGNOSIS — H3581 Retinal edema: Secondary | ICD-10-CM | POA: Diagnosis not present

## 2023-05-06 DIAGNOSIS — H30033 Focal chorioretinal inflammation, peripheral, bilateral: Secondary | ICD-10-CM | POA: Diagnosis not present

## 2023-05-08 DIAGNOSIS — E1122 Type 2 diabetes mellitus with diabetic chronic kidney disease: Secondary | ICD-10-CM | POA: Diagnosis not present

## 2023-05-08 DIAGNOSIS — D689 Coagulation defect, unspecified: Secondary | ICD-10-CM | POA: Diagnosis not present

## 2023-05-08 DIAGNOSIS — D509 Iron deficiency anemia, unspecified: Secondary | ICD-10-CM | POA: Diagnosis not present

## 2023-05-08 DIAGNOSIS — D631 Anemia in chronic kidney disease: Secondary | ICD-10-CM | POA: Diagnosis not present

## 2023-05-08 DIAGNOSIS — R519 Headache, unspecified: Secondary | ICD-10-CM | POA: Diagnosis not present

## 2023-05-08 DIAGNOSIS — N2581 Secondary hyperparathyroidism of renal origin: Secondary | ICD-10-CM | POA: Diagnosis not present

## 2023-05-08 DIAGNOSIS — N186 End stage renal disease: Secondary | ICD-10-CM | POA: Diagnosis not present

## 2023-05-08 DIAGNOSIS — Z992 Dependence on renal dialysis: Secondary | ICD-10-CM | POA: Diagnosis not present

## 2023-05-10 DIAGNOSIS — D509 Iron deficiency anemia, unspecified: Secondary | ICD-10-CM | POA: Diagnosis not present

## 2023-05-10 DIAGNOSIS — D689 Coagulation defect, unspecified: Secondary | ICD-10-CM | POA: Diagnosis not present

## 2023-05-10 DIAGNOSIS — E1122 Type 2 diabetes mellitus with diabetic chronic kidney disease: Secondary | ICD-10-CM | POA: Diagnosis not present

## 2023-05-10 DIAGNOSIS — D631 Anemia in chronic kidney disease: Secondary | ICD-10-CM | POA: Diagnosis not present

## 2023-05-10 DIAGNOSIS — R519 Headache, unspecified: Secondary | ICD-10-CM | POA: Diagnosis not present

## 2023-05-10 DIAGNOSIS — N2581 Secondary hyperparathyroidism of renal origin: Secondary | ICD-10-CM | POA: Diagnosis not present

## 2023-05-10 DIAGNOSIS — N186 End stage renal disease: Secondary | ICD-10-CM | POA: Diagnosis not present

## 2023-05-10 DIAGNOSIS — Z992 Dependence on renal dialysis: Secondary | ICD-10-CM | POA: Diagnosis not present

## 2023-05-13 DIAGNOSIS — E1122 Type 2 diabetes mellitus with diabetic chronic kidney disease: Secondary | ICD-10-CM | POA: Diagnosis not present

## 2023-05-13 DIAGNOSIS — Z992 Dependence on renal dialysis: Secondary | ICD-10-CM | POA: Diagnosis not present

## 2023-05-13 DIAGNOSIS — N2581 Secondary hyperparathyroidism of renal origin: Secondary | ICD-10-CM | POA: Diagnosis not present

## 2023-05-13 DIAGNOSIS — D631 Anemia in chronic kidney disease: Secondary | ICD-10-CM | POA: Diagnosis not present

## 2023-05-13 DIAGNOSIS — D509 Iron deficiency anemia, unspecified: Secondary | ICD-10-CM | POA: Diagnosis not present

## 2023-05-13 DIAGNOSIS — N186 End stage renal disease: Secondary | ICD-10-CM | POA: Diagnosis not present

## 2023-05-13 DIAGNOSIS — D689 Coagulation defect, unspecified: Secondary | ICD-10-CM | POA: Diagnosis not present

## 2023-05-13 DIAGNOSIS — R519 Headache, unspecified: Secondary | ICD-10-CM | POA: Diagnosis not present

## 2023-05-15 DIAGNOSIS — R519 Headache, unspecified: Secondary | ICD-10-CM | POA: Diagnosis not present

## 2023-05-15 DIAGNOSIS — Z992 Dependence on renal dialysis: Secondary | ICD-10-CM | POA: Diagnosis not present

## 2023-05-15 DIAGNOSIS — N186 End stage renal disease: Secondary | ICD-10-CM | POA: Diagnosis not present

## 2023-05-15 DIAGNOSIS — N2581 Secondary hyperparathyroidism of renal origin: Secondary | ICD-10-CM | POA: Diagnosis not present

## 2023-05-15 DIAGNOSIS — D509 Iron deficiency anemia, unspecified: Secondary | ICD-10-CM | POA: Diagnosis not present

## 2023-05-15 DIAGNOSIS — D631 Anemia in chronic kidney disease: Secondary | ICD-10-CM | POA: Diagnosis not present

## 2023-05-15 DIAGNOSIS — D689 Coagulation defect, unspecified: Secondary | ICD-10-CM | POA: Diagnosis not present

## 2023-05-15 DIAGNOSIS — E1122 Type 2 diabetes mellitus with diabetic chronic kidney disease: Secondary | ICD-10-CM | POA: Diagnosis not present

## 2023-05-17 DIAGNOSIS — D509 Iron deficiency anemia, unspecified: Secondary | ICD-10-CM | POA: Diagnosis not present

## 2023-05-17 DIAGNOSIS — R519 Headache, unspecified: Secondary | ICD-10-CM | POA: Diagnosis not present

## 2023-05-17 DIAGNOSIS — E1122 Type 2 diabetes mellitus with diabetic chronic kidney disease: Secondary | ICD-10-CM | POA: Diagnosis not present

## 2023-05-17 DIAGNOSIS — E1022 Type 1 diabetes mellitus with diabetic chronic kidney disease: Secondary | ICD-10-CM | POA: Diagnosis not present

## 2023-05-17 DIAGNOSIS — D631 Anemia in chronic kidney disease: Secondary | ICD-10-CM | POA: Diagnosis not present

## 2023-05-17 DIAGNOSIS — Z992 Dependence on renal dialysis: Secondary | ICD-10-CM | POA: Diagnosis not present

## 2023-05-17 DIAGNOSIS — N2581 Secondary hyperparathyroidism of renal origin: Secondary | ICD-10-CM | POA: Diagnosis not present

## 2023-05-17 DIAGNOSIS — N186 End stage renal disease: Secondary | ICD-10-CM | POA: Diagnosis not present

## 2023-05-17 DIAGNOSIS — D689 Coagulation defect, unspecified: Secondary | ICD-10-CM | POA: Diagnosis not present

## 2023-05-20 DIAGNOSIS — E1122 Type 2 diabetes mellitus with diabetic chronic kidney disease: Secondary | ICD-10-CM | POA: Diagnosis not present

## 2023-05-20 DIAGNOSIS — D631 Anemia in chronic kidney disease: Secondary | ICD-10-CM | POA: Diagnosis not present

## 2023-05-20 DIAGNOSIS — N186 End stage renal disease: Secondary | ICD-10-CM | POA: Diagnosis not present

## 2023-05-20 DIAGNOSIS — R519 Headache, unspecified: Secondary | ICD-10-CM | POA: Diagnosis not present

## 2023-05-20 DIAGNOSIS — D509 Iron deficiency anemia, unspecified: Secondary | ICD-10-CM | POA: Diagnosis not present

## 2023-05-20 DIAGNOSIS — Z992 Dependence on renal dialysis: Secondary | ICD-10-CM | POA: Diagnosis not present

## 2023-05-20 DIAGNOSIS — D689 Coagulation defect, unspecified: Secondary | ICD-10-CM | POA: Diagnosis not present

## 2023-05-20 DIAGNOSIS — N2581 Secondary hyperparathyroidism of renal origin: Secondary | ICD-10-CM | POA: Diagnosis not present

## 2023-05-23 DIAGNOSIS — R519 Headache, unspecified: Secondary | ICD-10-CM | POA: Diagnosis not present

## 2023-05-23 DIAGNOSIS — N186 End stage renal disease: Secondary | ICD-10-CM | POA: Diagnosis not present

## 2023-05-23 DIAGNOSIS — E1122 Type 2 diabetes mellitus with diabetic chronic kidney disease: Secondary | ICD-10-CM | POA: Diagnosis not present

## 2023-05-23 DIAGNOSIS — Z992 Dependence on renal dialysis: Secondary | ICD-10-CM | POA: Diagnosis not present

## 2023-05-23 DIAGNOSIS — D689 Coagulation defect, unspecified: Secondary | ICD-10-CM | POA: Diagnosis not present

## 2023-05-23 DIAGNOSIS — D509 Iron deficiency anemia, unspecified: Secondary | ICD-10-CM | POA: Diagnosis not present

## 2023-05-23 DIAGNOSIS — N2581 Secondary hyperparathyroidism of renal origin: Secondary | ICD-10-CM | POA: Diagnosis not present

## 2023-05-23 DIAGNOSIS — D631 Anemia in chronic kidney disease: Secondary | ICD-10-CM | POA: Diagnosis not present

## 2023-05-25 DIAGNOSIS — N186 End stage renal disease: Secondary | ICD-10-CM | POA: Diagnosis not present

## 2023-05-25 DIAGNOSIS — N2581 Secondary hyperparathyroidism of renal origin: Secondary | ICD-10-CM | POA: Diagnosis not present

## 2023-05-25 DIAGNOSIS — D631 Anemia in chronic kidney disease: Secondary | ICD-10-CM | POA: Diagnosis not present

## 2023-05-25 DIAGNOSIS — E1122 Type 2 diabetes mellitus with diabetic chronic kidney disease: Secondary | ICD-10-CM | POA: Diagnosis not present

## 2023-05-25 DIAGNOSIS — Z992 Dependence on renal dialysis: Secondary | ICD-10-CM | POA: Diagnosis not present

## 2023-05-25 DIAGNOSIS — D509 Iron deficiency anemia, unspecified: Secondary | ICD-10-CM | POA: Diagnosis not present

## 2023-05-25 DIAGNOSIS — D689 Coagulation defect, unspecified: Secondary | ICD-10-CM | POA: Diagnosis not present

## 2023-05-25 DIAGNOSIS — R519 Headache, unspecified: Secondary | ICD-10-CM | POA: Diagnosis not present

## 2023-05-27 DIAGNOSIS — D631 Anemia in chronic kidney disease: Secondary | ICD-10-CM | POA: Diagnosis not present

## 2023-05-27 DIAGNOSIS — D509 Iron deficiency anemia, unspecified: Secondary | ICD-10-CM | POA: Diagnosis not present

## 2023-05-27 DIAGNOSIS — Z992 Dependence on renal dialysis: Secondary | ICD-10-CM | POA: Diagnosis not present

## 2023-05-27 DIAGNOSIS — R519 Headache, unspecified: Secondary | ICD-10-CM | POA: Diagnosis not present

## 2023-05-27 DIAGNOSIS — N186 End stage renal disease: Secondary | ICD-10-CM | POA: Diagnosis not present

## 2023-05-27 DIAGNOSIS — N2581 Secondary hyperparathyroidism of renal origin: Secondary | ICD-10-CM | POA: Diagnosis not present

## 2023-05-27 DIAGNOSIS — E1122 Type 2 diabetes mellitus with diabetic chronic kidney disease: Secondary | ICD-10-CM | POA: Diagnosis not present

## 2023-05-27 DIAGNOSIS — D689 Coagulation defect, unspecified: Secondary | ICD-10-CM | POA: Diagnosis not present

## 2023-05-30 DIAGNOSIS — N186 End stage renal disease: Secondary | ICD-10-CM | POA: Diagnosis not present

## 2023-05-30 DIAGNOSIS — D689 Coagulation defect, unspecified: Secondary | ICD-10-CM | POA: Diagnosis not present

## 2023-05-30 DIAGNOSIS — E1122 Type 2 diabetes mellitus with diabetic chronic kidney disease: Secondary | ICD-10-CM | POA: Diagnosis not present

## 2023-05-30 DIAGNOSIS — D631 Anemia in chronic kidney disease: Secondary | ICD-10-CM | POA: Diagnosis not present

## 2023-05-30 DIAGNOSIS — Z992 Dependence on renal dialysis: Secondary | ICD-10-CM | POA: Diagnosis not present

## 2023-05-30 DIAGNOSIS — N2581 Secondary hyperparathyroidism of renal origin: Secondary | ICD-10-CM | POA: Diagnosis not present

## 2023-05-30 DIAGNOSIS — D509 Iron deficiency anemia, unspecified: Secondary | ICD-10-CM | POA: Diagnosis not present

## 2023-05-30 DIAGNOSIS — R519 Headache, unspecified: Secondary | ICD-10-CM | POA: Diagnosis not present

## 2023-06-01 DIAGNOSIS — Z992 Dependence on renal dialysis: Secondary | ICD-10-CM | POA: Diagnosis not present

## 2023-06-01 DIAGNOSIS — E1122 Type 2 diabetes mellitus with diabetic chronic kidney disease: Secondary | ICD-10-CM | POA: Diagnosis not present

## 2023-06-01 DIAGNOSIS — N2581 Secondary hyperparathyroidism of renal origin: Secondary | ICD-10-CM | POA: Diagnosis not present

## 2023-06-01 DIAGNOSIS — N186 End stage renal disease: Secondary | ICD-10-CM | POA: Diagnosis not present

## 2023-06-01 DIAGNOSIS — D689 Coagulation defect, unspecified: Secondary | ICD-10-CM | POA: Diagnosis not present

## 2023-06-01 DIAGNOSIS — D631 Anemia in chronic kidney disease: Secondary | ICD-10-CM | POA: Diagnosis not present

## 2023-06-01 DIAGNOSIS — R519 Headache, unspecified: Secondary | ICD-10-CM | POA: Diagnosis not present

## 2023-06-01 DIAGNOSIS — D509 Iron deficiency anemia, unspecified: Secondary | ICD-10-CM | POA: Diagnosis not present

## 2023-06-03 DIAGNOSIS — E1122 Type 2 diabetes mellitus with diabetic chronic kidney disease: Secondary | ICD-10-CM | POA: Diagnosis not present

## 2023-06-03 DIAGNOSIS — D689 Coagulation defect, unspecified: Secondary | ICD-10-CM | POA: Diagnosis not present

## 2023-06-03 DIAGNOSIS — D631 Anemia in chronic kidney disease: Secondary | ICD-10-CM | POA: Diagnosis not present

## 2023-06-03 DIAGNOSIS — D509 Iron deficiency anemia, unspecified: Secondary | ICD-10-CM | POA: Diagnosis not present

## 2023-06-03 DIAGNOSIS — R519 Headache, unspecified: Secondary | ICD-10-CM | POA: Diagnosis not present

## 2023-06-03 DIAGNOSIS — N2581 Secondary hyperparathyroidism of renal origin: Secondary | ICD-10-CM | POA: Diagnosis not present

## 2023-06-03 DIAGNOSIS — N186 End stage renal disease: Secondary | ICD-10-CM | POA: Diagnosis not present

## 2023-06-03 DIAGNOSIS — Z992 Dependence on renal dialysis: Secondary | ICD-10-CM | POA: Diagnosis not present

## 2023-06-06 DIAGNOSIS — E1122 Type 2 diabetes mellitus with diabetic chronic kidney disease: Secondary | ICD-10-CM | POA: Diagnosis not present

## 2023-06-06 DIAGNOSIS — N2581 Secondary hyperparathyroidism of renal origin: Secondary | ICD-10-CM | POA: Diagnosis not present

## 2023-06-06 DIAGNOSIS — N186 End stage renal disease: Secondary | ICD-10-CM | POA: Diagnosis not present

## 2023-06-06 DIAGNOSIS — D689 Coagulation defect, unspecified: Secondary | ICD-10-CM | POA: Diagnosis not present

## 2023-06-06 DIAGNOSIS — D631 Anemia in chronic kidney disease: Secondary | ICD-10-CM | POA: Diagnosis not present

## 2023-06-06 DIAGNOSIS — R519 Headache, unspecified: Secondary | ICD-10-CM | POA: Diagnosis not present

## 2023-06-06 DIAGNOSIS — D509 Iron deficiency anemia, unspecified: Secondary | ICD-10-CM | POA: Diagnosis not present

## 2023-06-06 DIAGNOSIS — Z992 Dependence on renal dialysis: Secondary | ICD-10-CM | POA: Diagnosis not present

## 2023-06-08 DIAGNOSIS — R519 Headache, unspecified: Secondary | ICD-10-CM | POA: Diagnosis not present

## 2023-06-08 DIAGNOSIS — E1122 Type 2 diabetes mellitus with diabetic chronic kidney disease: Secondary | ICD-10-CM | POA: Diagnosis not present

## 2023-06-08 DIAGNOSIS — N186 End stage renal disease: Secondary | ICD-10-CM | POA: Diagnosis not present

## 2023-06-08 DIAGNOSIS — Z992 Dependence on renal dialysis: Secondary | ICD-10-CM | POA: Diagnosis not present

## 2023-06-08 DIAGNOSIS — D631 Anemia in chronic kidney disease: Secondary | ICD-10-CM | POA: Diagnosis not present

## 2023-06-08 DIAGNOSIS — D509 Iron deficiency anemia, unspecified: Secondary | ICD-10-CM | POA: Diagnosis not present

## 2023-06-08 DIAGNOSIS — D689 Coagulation defect, unspecified: Secondary | ICD-10-CM | POA: Diagnosis not present

## 2023-06-08 DIAGNOSIS — N2581 Secondary hyperparathyroidism of renal origin: Secondary | ICD-10-CM | POA: Diagnosis not present

## 2023-06-09 DIAGNOSIS — N186 End stage renal disease: Secondary | ICD-10-CM | POA: Diagnosis not present

## 2023-06-09 DIAGNOSIS — I10 Essential (primary) hypertension: Secondary | ICD-10-CM | POA: Diagnosis not present

## 2023-06-09 DIAGNOSIS — I69351 Hemiplegia and hemiparesis following cerebral infarction affecting right dominant side: Secondary | ICD-10-CM | POA: Diagnosis not present

## 2023-06-09 DIAGNOSIS — Z8673 Personal history of transient ischemic attack (TIA), and cerebral infarction without residual deficits: Secondary | ICD-10-CM | POA: Diagnosis not present

## 2023-06-09 DIAGNOSIS — E1165 Type 2 diabetes mellitus with hyperglycemia: Secondary | ICD-10-CM | POA: Diagnosis not present

## 2023-06-09 DIAGNOSIS — D631 Anemia in chronic kidney disease: Secondary | ICD-10-CM | POA: Diagnosis not present

## 2023-06-10 DIAGNOSIS — D689 Coagulation defect, unspecified: Secondary | ICD-10-CM | POA: Diagnosis not present

## 2023-06-10 DIAGNOSIS — N186 End stage renal disease: Secondary | ICD-10-CM | POA: Diagnosis not present

## 2023-06-10 DIAGNOSIS — E1122 Type 2 diabetes mellitus with diabetic chronic kidney disease: Secondary | ICD-10-CM | POA: Diagnosis not present

## 2023-06-10 DIAGNOSIS — D509 Iron deficiency anemia, unspecified: Secondary | ICD-10-CM | POA: Diagnosis not present

## 2023-06-10 DIAGNOSIS — D631 Anemia in chronic kidney disease: Secondary | ICD-10-CM | POA: Diagnosis not present

## 2023-06-10 DIAGNOSIS — N2581 Secondary hyperparathyroidism of renal origin: Secondary | ICD-10-CM | POA: Diagnosis not present

## 2023-06-10 DIAGNOSIS — R519 Headache, unspecified: Secondary | ICD-10-CM | POA: Diagnosis not present

## 2023-06-10 DIAGNOSIS — Z992 Dependence on renal dialysis: Secondary | ICD-10-CM | POA: Diagnosis not present

## 2023-06-13 DIAGNOSIS — N2581 Secondary hyperparathyroidism of renal origin: Secondary | ICD-10-CM | POA: Diagnosis not present

## 2023-06-13 DIAGNOSIS — E1122 Type 2 diabetes mellitus with diabetic chronic kidney disease: Secondary | ICD-10-CM | POA: Diagnosis not present

## 2023-06-13 DIAGNOSIS — D509 Iron deficiency anemia, unspecified: Secondary | ICD-10-CM | POA: Diagnosis not present

## 2023-06-13 DIAGNOSIS — R519 Headache, unspecified: Secondary | ICD-10-CM | POA: Diagnosis not present

## 2023-06-13 DIAGNOSIS — N186 End stage renal disease: Secondary | ICD-10-CM | POA: Diagnosis not present

## 2023-06-13 DIAGNOSIS — D689 Coagulation defect, unspecified: Secondary | ICD-10-CM | POA: Diagnosis not present

## 2023-06-13 DIAGNOSIS — D631 Anemia in chronic kidney disease: Secondary | ICD-10-CM | POA: Diagnosis not present

## 2023-06-13 DIAGNOSIS — Z992 Dependence on renal dialysis: Secondary | ICD-10-CM | POA: Diagnosis not present

## 2023-06-15 DIAGNOSIS — D689 Coagulation defect, unspecified: Secondary | ICD-10-CM | POA: Diagnosis not present

## 2023-06-15 DIAGNOSIS — D509 Iron deficiency anemia, unspecified: Secondary | ICD-10-CM | POA: Diagnosis not present

## 2023-06-15 DIAGNOSIS — E1122 Type 2 diabetes mellitus with diabetic chronic kidney disease: Secondary | ICD-10-CM | POA: Diagnosis not present

## 2023-06-15 DIAGNOSIS — Z992 Dependence on renal dialysis: Secondary | ICD-10-CM | POA: Diagnosis not present

## 2023-06-15 DIAGNOSIS — R519 Headache, unspecified: Secondary | ICD-10-CM | POA: Diagnosis not present

## 2023-06-15 DIAGNOSIS — D631 Anemia in chronic kidney disease: Secondary | ICD-10-CM | POA: Diagnosis not present

## 2023-06-15 DIAGNOSIS — N2581 Secondary hyperparathyroidism of renal origin: Secondary | ICD-10-CM | POA: Diagnosis not present

## 2023-06-15 DIAGNOSIS — N186 End stage renal disease: Secondary | ICD-10-CM | POA: Diagnosis not present

## 2023-06-17 DIAGNOSIS — D631 Anemia in chronic kidney disease: Secondary | ICD-10-CM | POA: Diagnosis not present

## 2023-06-17 DIAGNOSIS — R519 Headache, unspecified: Secondary | ICD-10-CM | POA: Diagnosis not present

## 2023-06-17 DIAGNOSIS — D689 Coagulation defect, unspecified: Secondary | ICD-10-CM | POA: Diagnosis not present

## 2023-06-17 DIAGNOSIS — N2581 Secondary hyperparathyroidism of renal origin: Secondary | ICD-10-CM | POA: Diagnosis not present

## 2023-06-17 DIAGNOSIS — Z992 Dependence on renal dialysis: Secondary | ICD-10-CM | POA: Diagnosis not present

## 2023-06-17 DIAGNOSIS — E1022 Type 1 diabetes mellitus with diabetic chronic kidney disease: Secondary | ICD-10-CM | POA: Diagnosis not present

## 2023-06-17 DIAGNOSIS — D509 Iron deficiency anemia, unspecified: Secondary | ICD-10-CM | POA: Diagnosis not present

## 2023-06-17 DIAGNOSIS — N186 End stage renal disease: Secondary | ICD-10-CM | POA: Diagnosis not present

## 2023-06-17 DIAGNOSIS — E1122 Type 2 diabetes mellitus with diabetic chronic kidney disease: Secondary | ICD-10-CM | POA: Diagnosis not present

## 2023-06-20 DIAGNOSIS — D631 Anemia in chronic kidney disease: Secondary | ICD-10-CM | POA: Diagnosis not present

## 2023-06-20 DIAGNOSIS — E1122 Type 2 diabetes mellitus with diabetic chronic kidney disease: Secondary | ICD-10-CM | POA: Diagnosis not present

## 2023-06-20 DIAGNOSIS — D689 Coagulation defect, unspecified: Secondary | ICD-10-CM | POA: Diagnosis not present

## 2023-06-20 DIAGNOSIS — D509 Iron deficiency anemia, unspecified: Secondary | ICD-10-CM | POA: Diagnosis not present

## 2023-06-20 DIAGNOSIS — N2581 Secondary hyperparathyroidism of renal origin: Secondary | ICD-10-CM | POA: Diagnosis not present

## 2023-06-20 DIAGNOSIS — N186 End stage renal disease: Secondary | ICD-10-CM | POA: Diagnosis not present

## 2023-06-20 DIAGNOSIS — Z992 Dependence on renal dialysis: Secondary | ICD-10-CM | POA: Diagnosis not present

## 2023-06-22 DIAGNOSIS — Z992 Dependence on renal dialysis: Secondary | ICD-10-CM | POA: Diagnosis not present

## 2023-06-22 DIAGNOSIS — D631 Anemia in chronic kidney disease: Secondary | ICD-10-CM | POA: Diagnosis not present

## 2023-06-22 DIAGNOSIS — N186 End stage renal disease: Secondary | ICD-10-CM | POA: Diagnosis not present

## 2023-06-22 DIAGNOSIS — N2581 Secondary hyperparathyroidism of renal origin: Secondary | ICD-10-CM | POA: Diagnosis not present

## 2023-06-22 DIAGNOSIS — D689 Coagulation defect, unspecified: Secondary | ICD-10-CM | POA: Diagnosis not present

## 2023-06-22 DIAGNOSIS — E1122 Type 2 diabetes mellitus with diabetic chronic kidney disease: Secondary | ICD-10-CM | POA: Diagnosis not present

## 2023-06-22 DIAGNOSIS — D509 Iron deficiency anemia, unspecified: Secondary | ICD-10-CM | POA: Diagnosis not present

## 2023-06-24 DIAGNOSIS — N186 End stage renal disease: Secondary | ICD-10-CM | POA: Diagnosis not present

## 2023-06-24 DIAGNOSIS — D689 Coagulation defect, unspecified: Secondary | ICD-10-CM | POA: Diagnosis not present

## 2023-06-24 DIAGNOSIS — N2581 Secondary hyperparathyroidism of renal origin: Secondary | ICD-10-CM | POA: Diagnosis not present

## 2023-06-24 DIAGNOSIS — D509 Iron deficiency anemia, unspecified: Secondary | ICD-10-CM | POA: Diagnosis not present

## 2023-06-24 DIAGNOSIS — D631 Anemia in chronic kidney disease: Secondary | ICD-10-CM | POA: Diagnosis not present

## 2023-06-24 DIAGNOSIS — Z992 Dependence on renal dialysis: Secondary | ICD-10-CM | POA: Diagnosis not present

## 2023-06-24 DIAGNOSIS — E1122 Type 2 diabetes mellitus with diabetic chronic kidney disease: Secondary | ICD-10-CM | POA: Diagnosis not present

## 2023-06-27 DIAGNOSIS — J069 Acute upper respiratory infection, unspecified: Secondary | ICD-10-CM | POA: Diagnosis not present

## 2023-06-29 DIAGNOSIS — D689 Coagulation defect, unspecified: Secondary | ICD-10-CM | POA: Diagnosis not present

## 2023-06-29 DIAGNOSIS — N186 End stage renal disease: Secondary | ICD-10-CM | POA: Diagnosis not present

## 2023-06-29 DIAGNOSIS — D631 Anemia in chronic kidney disease: Secondary | ICD-10-CM | POA: Diagnosis not present

## 2023-06-29 DIAGNOSIS — N2581 Secondary hyperparathyroidism of renal origin: Secondary | ICD-10-CM | POA: Diagnosis not present

## 2023-06-29 DIAGNOSIS — Z992 Dependence on renal dialysis: Secondary | ICD-10-CM | POA: Diagnosis not present

## 2023-06-29 DIAGNOSIS — E1122 Type 2 diabetes mellitus with diabetic chronic kidney disease: Secondary | ICD-10-CM | POA: Diagnosis not present

## 2023-06-29 DIAGNOSIS — D509 Iron deficiency anemia, unspecified: Secondary | ICD-10-CM | POA: Diagnosis not present

## 2023-07-01 DIAGNOSIS — D509 Iron deficiency anemia, unspecified: Secondary | ICD-10-CM | POA: Diagnosis not present

## 2023-07-01 DIAGNOSIS — N186 End stage renal disease: Secondary | ICD-10-CM | POA: Diagnosis not present

## 2023-07-01 DIAGNOSIS — E1122 Type 2 diabetes mellitus with diabetic chronic kidney disease: Secondary | ICD-10-CM | POA: Diagnosis not present

## 2023-07-01 DIAGNOSIS — D631 Anemia in chronic kidney disease: Secondary | ICD-10-CM | POA: Diagnosis not present

## 2023-07-01 DIAGNOSIS — Z992 Dependence on renal dialysis: Secondary | ICD-10-CM | POA: Diagnosis not present

## 2023-07-01 DIAGNOSIS — D689 Coagulation defect, unspecified: Secondary | ICD-10-CM | POA: Diagnosis not present

## 2023-07-01 DIAGNOSIS — N2581 Secondary hyperparathyroidism of renal origin: Secondary | ICD-10-CM | POA: Diagnosis not present

## 2023-07-04 DIAGNOSIS — D631 Anemia in chronic kidney disease: Secondary | ICD-10-CM | POA: Diagnosis not present

## 2023-07-04 DIAGNOSIS — E1122 Type 2 diabetes mellitus with diabetic chronic kidney disease: Secondary | ICD-10-CM | POA: Diagnosis not present

## 2023-07-04 DIAGNOSIS — N2581 Secondary hyperparathyroidism of renal origin: Secondary | ICD-10-CM | POA: Diagnosis not present

## 2023-07-04 DIAGNOSIS — N186 End stage renal disease: Secondary | ICD-10-CM | POA: Diagnosis not present

## 2023-07-04 DIAGNOSIS — D509 Iron deficiency anemia, unspecified: Secondary | ICD-10-CM | POA: Diagnosis not present

## 2023-07-04 DIAGNOSIS — D689 Coagulation defect, unspecified: Secondary | ICD-10-CM | POA: Diagnosis not present

## 2023-07-04 DIAGNOSIS — Z992 Dependence on renal dialysis: Secondary | ICD-10-CM | POA: Diagnosis not present

## 2023-07-06 DIAGNOSIS — N186 End stage renal disease: Secondary | ICD-10-CM | POA: Diagnosis not present

## 2023-07-06 DIAGNOSIS — Z992 Dependence on renal dialysis: Secondary | ICD-10-CM | POA: Diagnosis not present

## 2023-07-06 DIAGNOSIS — D631 Anemia in chronic kidney disease: Secondary | ICD-10-CM | POA: Diagnosis not present

## 2023-07-06 DIAGNOSIS — D509 Iron deficiency anemia, unspecified: Secondary | ICD-10-CM | POA: Diagnosis not present

## 2023-07-06 DIAGNOSIS — D689 Coagulation defect, unspecified: Secondary | ICD-10-CM | POA: Diagnosis not present

## 2023-07-06 DIAGNOSIS — N2581 Secondary hyperparathyroidism of renal origin: Secondary | ICD-10-CM | POA: Diagnosis not present

## 2023-07-06 DIAGNOSIS — E1122 Type 2 diabetes mellitus with diabetic chronic kidney disease: Secondary | ICD-10-CM | POA: Diagnosis not present

## 2023-07-08 DIAGNOSIS — M533 Sacrococcygeal disorders, not elsewhere classified: Secondary | ICD-10-CM | POA: Diagnosis not present

## 2023-07-08 DIAGNOSIS — D689 Coagulation defect, unspecified: Secondary | ICD-10-CM | POA: Diagnosis not present

## 2023-07-08 DIAGNOSIS — D631 Anemia in chronic kidney disease: Secondary | ICD-10-CM | POA: Diagnosis not present

## 2023-07-08 DIAGNOSIS — I700 Atherosclerosis of aorta: Secondary | ICD-10-CM | POA: Diagnosis not present

## 2023-07-08 DIAGNOSIS — E1122 Type 2 diabetes mellitus with diabetic chronic kidney disease: Secondary | ICD-10-CM | POA: Diagnosis not present

## 2023-07-08 DIAGNOSIS — Z992 Dependence on renal dialysis: Secondary | ICD-10-CM | POA: Diagnosis not present

## 2023-07-08 DIAGNOSIS — D509 Iron deficiency anemia, unspecified: Secondary | ICD-10-CM | POA: Diagnosis not present

## 2023-07-08 DIAGNOSIS — N186 End stage renal disease: Secondary | ICD-10-CM | POA: Diagnosis not present

## 2023-07-08 DIAGNOSIS — N2581 Secondary hyperparathyroidism of renal origin: Secondary | ICD-10-CM | POA: Diagnosis not present

## 2023-07-11 DIAGNOSIS — D631 Anemia in chronic kidney disease: Secondary | ICD-10-CM | POA: Diagnosis not present

## 2023-07-11 DIAGNOSIS — N186 End stage renal disease: Secondary | ICD-10-CM | POA: Diagnosis not present

## 2023-07-11 DIAGNOSIS — D509 Iron deficiency anemia, unspecified: Secondary | ICD-10-CM | POA: Diagnosis not present

## 2023-07-11 DIAGNOSIS — E1122 Type 2 diabetes mellitus with diabetic chronic kidney disease: Secondary | ICD-10-CM | POA: Diagnosis not present

## 2023-07-11 DIAGNOSIS — Z992 Dependence on renal dialysis: Secondary | ICD-10-CM | POA: Diagnosis not present

## 2023-07-11 DIAGNOSIS — N2581 Secondary hyperparathyroidism of renal origin: Secondary | ICD-10-CM | POA: Diagnosis not present

## 2023-07-11 DIAGNOSIS — D689 Coagulation defect, unspecified: Secondary | ICD-10-CM | POA: Diagnosis not present

## 2023-07-13 DIAGNOSIS — D509 Iron deficiency anemia, unspecified: Secondary | ICD-10-CM | POA: Diagnosis not present

## 2023-07-13 DIAGNOSIS — D689 Coagulation defect, unspecified: Secondary | ICD-10-CM | POA: Diagnosis not present

## 2023-07-13 DIAGNOSIS — D631 Anemia in chronic kidney disease: Secondary | ICD-10-CM | POA: Diagnosis not present

## 2023-07-13 DIAGNOSIS — E1122 Type 2 diabetes mellitus with diabetic chronic kidney disease: Secondary | ICD-10-CM | POA: Diagnosis not present

## 2023-07-13 DIAGNOSIS — N186 End stage renal disease: Secondary | ICD-10-CM | POA: Diagnosis not present

## 2023-07-13 DIAGNOSIS — N2581 Secondary hyperparathyroidism of renal origin: Secondary | ICD-10-CM | POA: Diagnosis not present

## 2023-07-13 DIAGNOSIS — Z992 Dependence on renal dialysis: Secondary | ICD-10-CM | POA: Diagnosis not present

## 2023-07-15 DIAGNOSIS — D509 Iron deficiency anemia, unspecified: Secondary | ICD-10-CM | POA: Diagnosis not present

## 2023-07-15 DIAGNOSIS — Z992 Dependence on renal dialysis: Secondary | ICD-10-CM | POA: Diagnosis not present

## 2023-07-15 DIAGNOSIS — N186 End stage renal disease: Secondary | ICD-10-CM | POA: Diagnosis not present

## 2023-07-15 DIAGNOSIS — D689 Coagulation defect, unspecified: Secondary | ICD-10-CM | POA: Diagnosis not present

## 2023-07-15 DIAGNOSIS — E1122 Type 2 diabetes mellitus with diabetic chronic kidney disease: Secondary | ICD-10-CM | POA: Diagnosis not present

## 2023-07-15 DIAGNOSIS — E1022 Type 1 diabetes mellitus with diabetic chronic kidney disease: Secondary | ICD-10-CM | POA: Diagnosis not present

## 2023-07-15 DIAGNOSIS — N2581 Secondary hyperparathyroidism of renal origin: Secondary | ICD-10-CM | POA: Diagnosis not present

## 2023-07-15 DIAGNOSIS — D631 Anemia in chronic kidney disease: Secondary | ICD-10-CM | POA: Diagnosis not present

## 2023-07-18 DIAGNOSIS — D509 Iron deficiency anemia, unspecified: Secondary | ICD-10-CM | POA: Diagnosis not present

## 2023-07-18 DIAGNOSIS — T7840XA Allergy, unspecified, initial encounter: Secondary | ICD-10-CM | POA: Diagnosis not present

## 2023-07-18 DIAGNOSIS — D631 Anemia in chronic kidney disease: Secondary | ICD-10-CM | POA: Diagnosis not present

## 2023-07-18 DIAGNOSIS — R519 Headache, unspecified: Secondary | ICD-10-CM | POA: Diagnosis not present

## 2023-07-18 DIAGNOSIS — D689 Coagulation defect, unspecified: Secondary | ICD-10-CM | POA: Diagnosis not present

## 2023-07-18 DIAGNOSIS — N2581 Secondary hyperparathyroidism of renal origin: Secondary | ICD-10-CM | POA: Diagnosis not present

## 2023-07-18 DIAGNOSIS — N186 End stage renal disease: Secondary | ICD-10-CM | POA: Diagnosis not present

## 2023-07-18 DIAGNOSIS — L299 Pruritus, unspecified: Secondary | ICD-10-CM | POA: Diagnosis not present

## 2023-07-18 DIAGNOSIS — Z992 Dependence on renal dialysis: Secondary | ICD-10-CM | POA: Diagnosis not present

## 2023-07-20 DIAGNOSIS — R519 Headache, unspecified: Secondary | ICD-10-CM | POA: Diagnosis not present

## 2023-07-20 DIAGNOSIS — D631 Anemia in chronic kidney disease: Secondary | ICD-10-CM | POA: Diagnosis not present

## 2023-07-20 DIAGNOSIS — N2581 Secondary hyperparathyroidism of renal origin: Secondary | ICD-10-CM | POA: Diagnosis not present

## 2023-07-20 DIAGNOSIS — D509 Iron deficiency anemia, unspecified: Secondary | ICD-10-CM | POA: Diagnosis not present

## 2023-07-20 DIAGNOSIS — T7840XA Allergy, unspecified, initial encounter: Secondary | ICD-10-CM | POA: Diagnosis not present

## 2023-07-20 DIAGNOSIS — D689 Coagulation defect, unspecified: Secondary | ICD-10-CM | POA: Diagnosis not present

## 2023-07-20 DIAGNOSIS — Z992 Dependence on renal dialysis: Secondary | ICD-10-CM | POA: Diagnosis not present

## 2023-07-20 DIAGNOSIS — L299 Pruritus, unspecified: Secondary | ICD-10-CM | POA: Diagnosis not present

## 2023-07-20 DIAGNOSIS — N186 End stage renal disease: Secondary | ICD-10-CM | POA: Diagnosis not present

## 2023-07-25 DIAGNOSIS — N2581 Secondary hyperparathyroidism of renal origin: Secondary | ICD-10-CM | POA: Diagnosis not present

## 2023-07-25 DIAGNOSIS — Z992 Dependence on renal dialysis: Secondary | ICD-10-CM | POA: Diagnosis not present

## 2023-07-25 DIAGNOSIS — T7840XA Allergy, unspecified, initial encounter: Secondary | ICD-10-CM | POA: Diagnosis not present

## 2023-07-25 DIAGNOSIS — D689 Coagulation defect, unspecified: Secondary | ICD-10-CM | POA: Diagnosis not present

## 2023-07-25 DIAGNOSIS — L299 Pruritus, unspecified: Secondary | ICD-10-CM | POA: Diagnosis not present

## 2023-07-25 DIAGNOSIS — R519 Headache, unspecified: Secondary | ICD-10-CM | POA: Diagnosis not present

## 2023-07-25 DIAGNOSIS — N186 End stage renal disease: Secondary | ICD-10-CM | POA: Diagnosis not present

## 2023-07-25 DIAGNOSIS — D631 Anemia in chronic kidney disease: Secondary | ICD-10-CM | POA: Diagnosis not present

## 2023-07-25 DIAGNOSIS — D509 Iron deficiency anemia, unspecified: Secondary | ICD-10-CM | POA: Diagnosis not present

## 2023-07-27 DIAGNOSIS — L299 Pruritus, unspecified: Secondary | ICD-10-CM | POA: Diagnosis not present

## 2023-07-27 DIAGNOSIS — D689 Coagulation defect, unspecified: Secondary | ICD-10-CM | POA: Diagnosis not present

## 2023-07-27 DIAGNOSIS — Z992 Dependence on renal dialysis: Secondary | ICD-10-CM | POA: Diagnosis not present

## 2023-07-27 DIAGNOSIS — T7840XA Allergy, unspecified, initial encounter: Secondary | ICD-10-CM | POA: Diagnosis not present

## 2023-07-27 DIAGNOSIS — D509 Iron deficiency anemia, unspecified: Secondary | ICD-10-CM | POA: Diagnosis not present

## 2023-07-27 DIAGNOSIS — R519 Headache, unspecified: Secondary | ICD-10-CM | POA: Diagnosis not present

## 2023-07-27 DIAGNOSIS — N2581 Secondary hyperparathyroidism of renal origin: Secondary | ICD-10-CM | POA: Diagnosis not present

## 2023-07-27 DIAGNOSIS — D631 Anemia in chronic kidney disease: Secondary | ICD-10-CM | POA: Diagnosis not present

## 2023-07-27 DIAGNOSIS — N186 End stage renal disease: Secondary | ICD-10-CM | POA: Diagnosis not present

## 2023-07-29 DIAGNOSIS — D689 Coagulation defect, unspecified: Secondary | ICD-10-CM | POA: Diagnosis not present

## 2023-07-29 DIAGNOSIS — Z992 Dependence on renal dialysis: Secondary | ICD-10-CM | POA: Diagnosis not present

## 2023-07-29 DIAGNOSIS — N186 End stage renal disease: Secondary | ICD-10-CM | POA: Diagnosis not present

## 2023-07-29 DIAGNOSIS — L299 Pruritus, unspecified: Secondary | ICD-10-CM | POA: Diagnosis not present

## 2023-07-29 DIAGNOSIS — R519 Headache, unspecified: Secondary | ICD-10-CM | POA: Diagnosis not present

## 2023-07-29 DIAGNOSIS — D631 Anemia in chronic kidney disease: Secondary | ICD-10-CM | POA: Diagnosis not present

## 2023-07-29 DIAGNOSIS — T7840XA Allergy, unspecified, initial encounter: Secondary | ICD-10-CM | POA: Diagnosis not present

## 2023-07-29 DIAGNOSIS — N2581 Secondary hyperparathyroidism of renal origin: Secondary | ICD-10-CM | POA: Diagnosis not present

## 2023-07-29 DIAGNOSIS — D509 Iron deficiency anemia, unspecified: Secondary | ICD-10-CM | POA: Diagnosis not present

## 2023-08-01 DIAGNOSIS — D631 Anemia in chronic kidney disease: Secondary | ICD-10-CM | POA: Diagnosis not present

## 2023-08-01 DIAGNOSIS — R519 Headache, unspecified: Secondary | ICD-10-CM | POA: Diagnosis not present

## 2023-08-01 DIAGNOSIS — D509 Iron deficiency anemia, unspecified: Secondary | ICD-10-CM | POA: Diagnosis not present

## 2023-08-01 DIAGNOSIS — M533 Sacrococcygeal disorders, not elsewhere classified: Secondary | ICD-10-CM | POA: Diagnosis not present

## 2023-08-01 DIAGNOSIS — N186 End stage renal disease: Secondary | ICD-10-CM | POA: Diagnosis not present

## 2023-08-01 DIAGNOSIS — L299 Pruritus, unspecified: Secondary | ICD-10-CM | POA: Diagnosis not present

## 2023-08-01 DIAGNOSIS — N2581 Secondary hyperparathyroidism of renal origin: Secondary | ICD-10-CM | POA: Diagnosis not present

## 2023-08-01 DIAGNOSIS — T7840XA Allergy, unspecified, initial encounter: Secondary | ICD-10-CM | POA: Diagnosis not present

## 2023-08-01 DIAGNOSIS — Z992 Dependence on renal dialysis: Secondary | ICD-10-CM | POA: Diagnosis not present

## 2023-08-01 DIAGNOSIS — D689 Coagulation defect, unspecified: Secondary | ICD-10-CM | POA: Diagnosis not present

## 2023-08-03 DIAGNOSIS — R519 Headache, unspecified: Secondary | ICD-10-CM | POA: Diagnosis not present

## 2023-08-03 DIAGNOSIS — D631 Anemia in chronic kidney disease: Secondary | ICD-10-CM | POA: Diagnosis not present

## 2023-08-03 DIAGNOSIS — D509 Iron deficiency anemia, unspecified: Secondary | ICD-10-CM | POA: Diagnosis not present

## 2023-08-03 DIAGNOSIS — L299 Pruritus, unspecified: Secondary | ICD-10-CM | POA: Diagnosis not present

## 2023-08-03 DIAGNOSIS — T7840XA Allergy, unspecified, initial encounter: Secondary | ICD-10-CM | POA: Diagnosis not present

## 2023-08-03 DIAGNOSIS — N186 End stage renal disease: Secondary | ICD-10-CM | POA: Diagnosis not present

## 2023-08-03 DIAGNOSIS — Z992 Dependence on renal dialysis: Secondary | ICD-10-CM | POA: Diagnosis not present

## 2023-08-03 DIAGNOSIS — N2581 Secondary hyperparathyroidism of renal origin: Secondary | ICD-10-CM | POA: Diagnosis not present

## 2023-08-03 DIAGNOSIS — D689 Coagulation defect, unspecified: Secondary | ICD-10-CM | POA: Diagnosis not present

## 2023-08-05 DIAGNOSIS — R519 Headache, unspecified: Secondary | ICD-10-CM | POA: Diagnosis not present

## 2023-08-05 DIAGNOSIS — D689 Coagulation defect, unspecified: Secondary | ICD-10-CM | POA: Diagnosis not present

## 2023-08-05 DIAGNOSIS — Z992 Dependence on renal dialysis: Secondary | ICD-10-CM | POA: Diagnosis not present

## 2023-08-05 DIAGNOSIS — T7840XA Allergy, unspecified, initial encounter: Secondary | ICD-10-CM | POA: Diagnosis not present

## 2023-08-05 DIAGNOSIS — D631 Anemia in chronic kidney disease: Secondary | ICD-10-CM | POA: Diagnosis not present

## 2023-08-05 DIAGNOSIS — D509 Iron deficiency anemia, unspecified: Secondary | ICD-10-CM | POA: Diagnosis not present

## 2023-08-05 DIAGNOSIS — L299 Pruritus, unspecified: Secondary | ICD-10-CM | POA: Diagnosis not present

## 2023-08-05 DIAGNOSIS — N186 End stage renal disease: Secondary | ICD-10-CM | POA: Diagnosis not present

## 2023-08-05 DIAGNOSIS — N2581 Secondary hyperparathyroidism of renal origin: Secondary | ICD-10-CM | POA: Diagnosis not present

## 2023-08-08 DIAGNOSIS — N186 End stage renal disease: Secondary | ICD-10-CM | POA: Diagnosis not present

## 2023-08-08 DIAGNOSIS — T7840XA Allergy, unspecified, initial encounter: Secondary | ICD-10-CM | POA: Diagnosis not present

## 2023-08-08 DIAGNOSIS — D509 Iron deficiency anemia, unspecified: Secondary | ICD-10-CM | POA: Diagnosis not present

## 2023-08-08 DIAGNOSIS — Z992 Dependence on renal dialysis: Secondary | ICD-10-CM | POA: Diagnosis not present

## 2023-08-08 DIAGNOSIS — D631 Anemia in chronic kidney disease: Secondary | ICD-10-CM | POA: Diagnosis not present

## 2023-08-08 DIAGNOSIS — L299 Pruritus, unspecified: Secondary | ICD-10-CM | POA: Diagnosis not present

## 2023-08-08 DIAGNOSIS — D689 Coagulation defect, unspecified: Secondary | ICD-10-CM | POA: Diagnosis not present

## 2023-08-08 DIAGNOSIS — R519 Headache, unspecified: Secondary | ICD-10-CM | POA: Diagnosis not present

## 2023-08-08 DIAGNOSIS — N2581 Secondary hyperparathyroidism of renal origin: Secondary | ICD-10-CM | POA: Diagnosis not present

## 2023-08-10 DIAGNOSIS — N2581 Secondary hyperparathyroidism of renal origin: Secondary | ICD-10-CM | POA: Diagnosis not present

## 2023-08-10 DIAGNOSIS — L299 Pruritus, unspecified: Secondary | ICD-10-CM | POA: Diagnosis not present

## 2023-08-10 DIAGNOSIS — D689 Coagulation defect, unspecified: Secondary | ICD-10-CM | POA: Diagnosis not present

## 2023-08-10 DIAGNOSIS — R519 Headache, unspecified: Secondary | ICD-10-CM | POA: Diagnosis not present

## 2023-08-10 DIAGNOSIS — T7840XA Allergy, unspecified, initial encounter: Secondary | ICD-10-CM | POA: Diagnosis not present

## 2023-08-10 DIAGNOSIS — D509 Iron deficiency anemia, unspecified: Secondary | ICD-10-CM | POA: Diagnosis not present

## 2023-08-10 DIAGNOSIS — N186 End stage renal disease: Secondary | ICD-10-CM | POA: Diagnosis not present

## 2023-08-10 DIAGNOSIS — D631 Anemia in chronic kidney disease: Secondary | ICD-10-CM | POA: Diagnosis not present

## 2023-08-10 DIAGNOSIS — Z992 Dependence on renal dialysis: Secondary | ICD-10-CM | POA: Diagnosis not present

## 2023-08-12 DIAGNOSIS — L299 Pruritus, unspecified: Secondary | ICD-10-CM | POA: Diagnosis not present

## 2023-08-12 DIAGNOSIS — Z992 Dependence on renal dialysis: Secondary | ICD-10-CM | POA: Diagnosis not present

## 2023-08-12 DIAGNOSIS — T7840XA Allergy, unspecified, initial encounter: Secondary | ICD-10-CM | POA: Diagnosis not present

## 2023-08-12 DIAGNOSIS — R519 Headache, unspecified: Secondary | ICD-10-CM | POA: Diagnosis not present

## 2023-08-12 DIAGNOSIS — D631 Anemia in chronic kidney disease: Secondary | ICD-10-CM | POA: Diagnosis not present

## 2023-08-12 DIAGNOSIS — D689 Coagulation defect, unspecified: Secondary | ICD-10-CM | POA: Diagnosis not present

## 2023-08-12 DIAGNOSIS — N2581 Secondary hyperparathyroidism of renal origin: Secondary | ICD-10-CM | POA: Diagnosis not present

## 2023-08-12 DIAGNOSIS — D509 Iron deficiency anemia, unspecified: Secondary | ICD-10-CM | POA: Diagnosis not present

## 2023-08-12 DIAGNOSIS — N186 End stage renal disease: Secondary | ICD-10-CM | POA: Diagnosis not present

## 2023-08-15 DIAGNOSIS — D689 Coagulation defect, unspecified: Secondary | ICD-10-CM | POA: Diagnosis not present

## 2023-08-15 DIAGNOSIS — Z992 Dependence on renal dialysis: Secondary | ICD-10-CM | POA: Diagnosis not present

## 2023-08-15 DIAGNOSIS — T7840XA Allergy, unspecified, initial encounter: Secondary | ICD-10-CM | POA: Diagnosis not present

## 2023-08-15 DIAGNOSIS — L299 Pruritus, unspecified: Secondary | ICD-10-CM | POA: Diagnosis not present

## 2023-08-15 DIAGNOSIS — D509 Iron deficiency anemia, unspecified: Secondary | ICD-10-CM | POA: Diagnosis not present

## 2023-08-15 DIAGNOSIS — R519 Headache, unspecified: Secondary | ICD-10-CM | POA: Diagnosis not present

## 2023-08-15 DIAGNOSIS — E1022 Type 1 diabetes mellitus with diabetic chronic kidney disease: Secondary | ICD-10-CM | POA: Diagnosis not present

## 2023-08-15 DIAGNOSIS — D631 Anemia in chronic kidney disease: Secondary | ICD-10-CM | POA: Diagnosis not present

## 2023-08-15 DIAGNOSIS — N186 End stage renal disease: Secondary | ICD-10-CM | POA: Diagnosis not present

## 2023-08-15 DIAGNOSIS — N2581 Secondary hyperparathyroidism of renal origin: Secondary | ICD-10-CM | POA: Diagnosis not present

## 2023-08-16 ENCOUNTER — Encounter (HOSPITAL_COMMUNITY)
Admission: RE | Disposition: A | Discharge status: Home / Self Care | Payer: Self-pay | Source: Home / Self Care | Attending: Nephrology

## 2023-08-16 ENCOUNTER — Other Ambulatory Visit: Payer: Self-pay

## 2023-08-16 ENCOUNTER — Ambulatory Visit (HOSPITAL_COMMUNITY)
Admission: RE | Admit: 2023-08-16 | Discharge: 2023-08-16 | Disposition: A | Discharge status: Home / Self Care | Attending: Nephrology | Admitting: Nephrology

## 2023-08-16 ENCOUNTER — Encounter (HOSPITAL_COMMUNITY): Payer: Self-pay | Admitting: Nephrology

## 2023-08-16 DIAGNOSIS — T82858A Stenosis of vascular prosthetic devices, implants and grafts, initial encounter: Secondary | ICD-10-CM | POA: Insufficient documentation

## 2023-08-16 DIAGNOSIS — I252 Old myocardial infarction: Secondary | ICD-10-CM | POA: Insufficient documentation

## 2023-08-16 DIAGNOSIS — N186 End stage renal disease: Secondary | ICD-10-CM | POA: Diagnosis not present

## 2023-08-16 DIAGNOSIS — E785 Hyperlipidemia, unspecified: Secondary | ICD-10-CM | POA: Diagnosis not present

## 2023-08-16 DIAGNOSIS — Z955 Presence of coronary angioplasty implant and graft: Secondary | ICD-10-CM | POA: Diagnosis not present

## 2023-08-16 DIAGNOSIS — I871 Compression of vein: Secondary | ICD-10-CM | POA: Diagnosis not present

## 2023-08-16 DIAGNOSIS — I120 Hypertensive chronic kidney disease with stage 5 chronic kidney disease or end stage renal disease: Secondary | ICD-10-CM | POA: Diagnosis not present

## 2023-08-16 DIAGNOSIS — I2510 Atherosclerotic heart disease of native coronary artery without angina pectoris: Secondary | ICD-10-CM | POA: Insufficient documentation

## 2023-08-16 DIAGNOSIS — Y832 Surgical operation with anastomosis, bypass or graft as the cause of abnormal reaction of the patient, or of later complication, without mention of misadventure at the time of the procedure: Secondary | ICD-10-CM | POA: Diagnosis not present

## 2023-08-16 DIAGNOSIS — Z992 Dependence on renal dialysis: Secondary | ICD-10-CM | POA: Diagnosis not present

## 2023-08-16 DIAGNOSIS — D631 Anemia in chronic kidney disease: Secondary | ICD-10-CM | POA: Insufficient documentation

## 2023-08-16 DIAGNOSIS — E1122 Type 2 diabetes mellitus with diabetic chronic kidney disease: Secondary | ICD-10-CM | POA: Diagnosis not present

## 2023-08-16 DIAGNOSIS — G35 Multiple sclerosis: Secondary | ICD-10-CM | POA: Insufficient documentation

## 2023-08-16 DIAGNOSIS — I69351 Hemiplegia and hemiparesis following cerebral infarction affecting right dominant side: Secondary | ICD-10-CM | POA: Diagnosis not present

## 2023-08-16 DIAGNOSIS — Z794 Long term (current) use of insulin: Secondary | ICD-10-CM | POA: Diagnosis not present

## 2023-08-16 HISTORY — PX: VENOUS ANGIOPLASTY: CATH118376

## 2023-08-16 HISTORY — PX: A/V SHUNT INTERVENTION: CATH118220

## 2023-08-16 SURGERY — A/V SHUNT INTERVENTION
Anesthesia: LOCAL | Laterality: Right

## 2023-08-16 MED ORDER — HEPARIN (PORCINE) IN NACL 1000-0.9 UT/500ML-% IV SOLN
INTRAVENOUS | Status: DC | PRN
  Administered 2023-08-16: 500 mL

## 2023-08-16 MED ORDER — MIDAZOLAM HCL 2 MG/2ML IJ SOLN
INTRAMUSCULAR | Status: AC
  Filled 2023-08-16: qty 2

## 2023-08-16 MED ORDER — FENTANYL CITRATE (PF) 100 MCG/2ML IJ SOLN
INTRAMUSCULAR | Status: DC | PRN
  Administered 2023-08-16: 25 ug via INTRAVENOUS

## 2023-08-16 MED ORDER — MIDAZOLAM HCL 2 MG/2ML IJ SOLN
INTRAMUSCULAR | Status: DC | PRN
  Administered 2023-08-16: 1 mg via INTRAVENOUS

## 2023-08-16 MED ORDER — IODIXANOL 320 MG/ML IV SOLN
INTRAVENOUS | Status: DC | PRN
  Administered 2023-08-16: 8 mL via INTRAVENOUS

## 2023-08-16 MED ORDER — FENTANYL CITRATE (PF) 100 MCG/2ML IJ SOLN
INTRAMUSCULAR | Status: AC
Start: 2023-08-16 — End: ?
  Filled 2023-08-16: qty 2

## 2023-08-16 MED ORDER — LIDOCAINE HCL (PF) 1 % IJ SOLN
INTRAMUSCULAR | Status: AC
  Filled 2023-08-16: qty 30

## 2023-08-16 MED ORDER — SODIUM CHLORIDE 0.9 % IV SOLN: INTRAVENOUS | Status: DC

## 2023-08-16 MED ORDER — LIDOCAINE HCL (PF) 1 % IJ SOLN
INTRAMUSCULAR | Status: DC | PRN
  Administered 2023-08-16: 2 mL via SUBCUTANEOUS

## 2023-08-16 SURGICAL SUPPLY — 12 items
BAG SNAP BAND KOVER 36X36 (MISCELLANEOUS) ×2 IMPLANT
BALLN MUSTANG 12.0X40 75 (BALLOONS) ×2 IMPLANT
BALLN MUSTANG 8X80X75 (BALLOONS) ×2 IMPLANT
BALLOON MUSTANG 12.0X40 75 (BALLOONS) IMPLANT
BALLOON MUSTANG 8X80X75 (BALLOONS) IMPLANT
COVER DOME SNAP 22 D (MISCELLANEOUS) ×2 IMPLANT
KIT MICROPUNCTURE NIT STIFF (SHEATH) IMPLANT
MAT PREVALON FULL STRYKER (MISCELLANEOUS) IMPLANT
SHEATH PINNACLE R/O II 7F 4CM (SHEATH) IMPLANT
SYR MEDALLION 10ML (SYRINGE) IMPLANT
TRAY PV CATH (CUSTOM PROCEDURE TRAY) ×2 IMPLANT
WIRE BENTSON .035X145CM (WIRE) IMPLANT

## 2023-08-16 NOTE — Op Note (Signed)
 Patient presents with decreased access flows and prolonged cannulation site bleeding from her right upper arm straight graft placed in January 2024. Her last procedure here was 10 mm right innominate vein and 8 mm outflow basilic vein angioplasty performed 4 months ago. On exam the RUA straight AVG is hyperpulsatile.   Summary:  1) Successful angiogram of a right upper arm straight AVG with evidence of 70% right innominate vein stenosis treated to 10% with 12 mm Mustang FE ~16 ATM and 70% right basilic vein stenosis on the outflow edge of stent treated to 10% with 8 mm Mustang FE ~16 ATM.   2) The body of the graft, venous anastomosis times, right axillary vein, and inflow were widely patent. 3) This right upper arm straight graft remains amenable to future percutaneous interventions.  Description of procedure: The right upper arm was prepped and draped in the usual fashion. The right upper arm straight AVG was cannulated (36902/36907) in the arterial limb of the graft in an antegrade direction with a 21G micropuncture needle and then a 7Fr sheath was inserted by guidewire exchange technique. The angiogram revealed a patent body of the graft with a patent overlapping stents across the venous anastomosis with a 70% basilic vein outflow edge of stent stenosis and 70% stenosis at the right innominate vein. The body of the graft, right axillary vein and inflow were patent.  A guidewire was easily advanced past the outflow stenosis and parked in the central veins. I FIRST advanced a 12 x 40 Mustang angioplasty balloon over the guidewire and positioned it at the site of right innominate vein stenosis.  Central venous angioplasty (13086) was carried out to full effacement at approximately 16 ATM pressure via hand syringe assembly.  NEXT, the balloon was removed and replaced with an 8 x 80 Mustang angioplasty balloon over the guidewire to the level of the outflow edge of stent/basilic vein stenosis. Venous  angioplasty (57846) was performed to 100% balloon effacement with approximately 16ATM of pressure via a hand syringe assembly.   Final arteriogram and completion venogram revealed no evidence of extravasation or dissection, more rapid access flows through the graft and 10% residual stenosis at the sites of angioplasty.  Hemostasis: A 3-0 ethilon purse string suture was placed at the cannulation site on removal of the sheath.  Sedation: 1mg  Versed, Fentanyl.  Sedation time: 10 minutes  Contrast. 8 mL  Monitoring: Because of the patient's comorbid conditions and sedation during the procedure, continuous EKG monitoring and O2 saturation monitoring was performed throughout the procedure by the RN. There were no abnormal arrhythmias encountered.  Complications: None.   Diagnoses: I87.1 Stricture of vein  N18.6 ESRD T82.858A Stricture of access  Procedure Coding:  [phone] Cannulation and angiogram of fistula, venous angioplasty (basilic vein) 28413: Cannulation and angiogram of fistula, central vein angioplasty (right innominate vein) K4401 Contrast  Recommendations:  1. Continue to cannulate the graft with 15G needles.  2. Refer back for problems with flows. 3. Remove the suture next treatment.   Discharge: The patient was discharged home in stable condition. The patient was given education regarding the care of the dialysis access AVG and specific instructions in case of any problems.

## 2023-08-16 NOTE — H&P (Signed)
 Hannah [name] is an 58 y.o. female.   Chief Complaint: Prolonged cannulation site bleeding, decreased access flows HPI:  58 year old woman with past medical history significant for type 2 diabetes mellitus, dyslipidemia, hypertension, multiple sclerosis, history of coronary artery disease status post PCI, history of CVA with hemiparesis affecting the right side and end-stage renal disease on hemodialysis.  She previously had a right brachiobasilic fistula with persistent venous disease and underwent jump graft placement in January 2024.  Her last procedure here was 10 mm central vein angioplasty 3 months ago.  She presents with prolonged cannulation site bleeding and decreased access flows.  She denies any constitutional complaints and does not have any chest pain or shortness of breath at this time.  The procedure was explained to the patient and she is willing to proceed, consent obtained.  Past Medical History:  Diagnosis Date   CAD S/P PCI x 2 2009   MI 2009 -> PCI x 2 ([place]); 01/2016 Nuc ST Nonischemic with Normal EF   Cerebral infarction (HCC) 07/12/2017   2009 first one, has had a total of 4 stokes- last 2021   Chronic kidney disease    Hemo M-W- F   Diabetes type 2, controlled (HCC)    Hemiparesis affecting right side as late effect of cerebrovascular accident (CVA) (HCC)    Hyperlipidemia    Hypertension    Intractable vomiting with nausea 07/12/2017   Multiple sclerosis (HCC) 2009   Myocardial infarction [facility]) 2009   Stroke [facility])     Past Surgical History:  Procedure Laterality Date   A/V FISTULAGRAM Right 02/12/2021   Procedure: A/V FISTULAGRAM;  Surgeon: Cephus Shelling, MD;  Location: [facility] INVASIVE CV LAB;  Service: Cardiovascular;  Laterality: Right;   A/V FISTULAGRAM Right 05/20/2022   Procedure: A/V Fistulagram;  Surgeon: Cephus Shelling, MD;  Location: [facility] INVASIVE CV LAB;  Service: Cardiovascular;  Laterality: Right;   A/V FISTULAGRAM N/A 05/05/2023    Procedure: A/V Fistulagram;  Surgeon: Ethelene Hal, MD;  Location: [facility] INVASIVE CV LAB;  Service: Cardiovascular;  Laterality: N/A;   APPENDECTOMY     AV FISTULA PLACEMENT Right 10/02/2020   Procedure: RIGHT ARTERIOVENOUS (AV) FISTULA CREATION;  Surgeon: Maeola Harman, MD;  Location: [facility] OR;  Service: Vascular;  Laterality: Right;   AV FISTULA PLACEMENT Right 05/28/2022   Procedure: RIGHT ARM ARTERIOVENOUS (AV) FISTULA REVISION WITH GORETEX GRAFT;  Surgeon: Cephus Shelling, MD;  Location: [facility] OR;  Service: Vascular;  Laterality: Right;   BASCILIC VEIN TRANSPOSITION Right 11/24/2020   Procedure: SECOND STAGE RIGHT ARM BASCILIC VEIN TRANSPOSITION;  Surgeon: Cephus Shelling, MD;  Location: [facility] OR;  Service: Vascular;  Laterality: Right;   CYSTOSTOMY W/ BLADDER BIOPSY     INSERTION OF DIALYSIS CATHETER Right 05/28/2022   Procedure: INSERTION OF TUNNELED DIALYSIS CATHETER IN THE RIGHT INTERNAL JUGULAR;  Surgeon: Cephus Shelling, MD;  Location: [facility] OR;  Service: Vascular;  Laterality: Right;   IR FLUORO GUIDE CV LINE RIGHT  09/29/2020   IR US GUIDE VASC ACCESS RIGHT  09/29/2020   NM MYOVIEW LTD  12/25/2015   [place], [place] - NORMAL /NEGATIVE for ischemia. Normal LV Fxn   OVARIAN CYST SURGERY     PERCUTANEOUS CORONARY STENT INTERVENTION (PCI-S)  2009   [place], [place] - 2 stents in settng of MI -- unknown details   PERIPHERAL VASCULAR BALLOON ANGIOPLASTY Right 02/12/2021   Procedure: PERIPHERAL VASCULAR BALLOON ANGIOPLASTY;  Surgeon: Cephus Shelling, MD;  Location: [facility] INVASIVE  CV LAB;  Service: Cardiovascular;  Laterality: Right;  UPPER ARM FISTULA   PERIPHERAL VASCULAR BALLOON ANGIOPLASTY  05/20/2022   Procedure: PERIPHERAL VASCULAR BALLOON ANGIOPLASTY;  Surgeon: Cephus Shelling, MD;  Location: [facility] INVASIVE CV LAB;  Service: Cardiovascular;;  rt arm fistula site   PERIPHERAL VASCULAR BALLOON ANGIOPLASTY Right 01/28/2022   Procedure: PERIPHERAL VASCULAR BALLOON ANGIOPLASTY;   Surgeon: Cephus Shelling, MD;  Location: [facility] INVASIVE CV LAB;  Service: Cardiovascular;  Laterality: Right;  Arm fistula   PERIPHERAL VASCULAR BALLOON ANGIOPLASTY  05/05/2023   Procedure: PERIPHERAL VASCULAR BALLOON ANGIOPLASTY;  Surgeon: Ethelene Hal, MD;  Location: [facility] INVASIVE CV LAB;  Service: Cardiovascular;;  RUA AVF   TRANSTHORACIC ECHOCARDIOGRAM  07/11/2017   a) 12/2015, [facility], [place]): Moderate Conc LVH w/ sigmoid septum.  No LVOT obstruction.  Hyperdynamic LV fxn - EF 65 to 70%.  GR 1 DD & high LAP/LVEDP.  AoV Sclerosis - No AS;; b) 06/2017: [facility]): Normal LV Fxn - EF 60%.  Abnormal septal motion.  Mild LVH mild MAC.  Negative bubble study (for CVA)   TRANSTHORACIC ECHOCARDIOGRAM  12/2020   a) 05/2020: EF 60-65%. No RWMA. Gr2DD. Sm posterior pericardial effusion. No AS.; b) 09/2020: EF 70-75%.  Hyperdynamic.  No RWMA. ? D Fxn/LAP.  Mildly elevated PAP.  Pericardial effusion.  Mild ao V sclerosis cyst.; c) 12/2020 (Limited): EF 65-70%. No RWMA. Gr1-2 DD. Mild Conc LVH- high LAP. Nl PAP. Circumferential effusion w/o tamponade. AoV Sclerosis - no AS. Nl RAP.   ULTRASOUND GUIDANCE FOR VASCULAR ACCESS Right 05/28/2022   Procedure: ULTRASOUND GUIDANCE FOR VASCULAR ACCESS;  Surgeon: Cephus Shelling, MD;  Location: [facility] OR;  Service: Vascular;  Laterality: Right;    No family history on file. Social History:  reports that she has never smoked. She has never used smokeless tobacco. She reports that she does not currently use alcohol. She reports that she does not use drugs.  Allergies:  Allergies  Allergen Reactions   Ibuprofen Swelling    Swelling to hands, feet and face    Influenza Vaccines Anaphylaxis and Swelling    fever   Pneumococcal Vaccine Anaphylaxis and Swelling    fever   Promethazine Other (See Comments)    hallucinations   Ambien [Zolpidem] Other (See Comments)    Unknown reaction   Codeine Itching   Cyclobenzaprine Other (See Comments)     hallucinations    Hydrocodone-Acetaminophen Itching   Tape Itching   Tramadol Hcl Other (See Comments)    hallucinations   Wound Dressing Adhesive Itching    Use paper tape   Oxycodone Nausea And Vomiting    Medications Prior to Admission  Medication Sig Dispense Refill   acetaminophen (TYLENOL) 325 MG tablet Take 650 mg by mouth every 6 (six) hours as needed for moderate pain.     amLODipine (NORVASC) 10 MG tablet Take 1 tablet (10 mg total) by mouth daily. Except on dialysis days do not take a dose. 30 tablet 0   azaTHIOprine (IMURAN) 50 MG tablet Take 50 mg by mouth daily.     calcium acetate (PHOSLO) 667 MG capsule Take 667 mg by mouth in the morning, at noon, and at bedtime.     carvedilol (COREG) 25 MG tablet Take 1 tablet (25 mg total) by mouth 2 (two) times daily with a meal. And as directed. 60 tablet 0   clopidogrel (PLAVIX) 75 MG tablet Take 1 tablet (75 mg total) by mouth daily. 30 tablet 0  diclofenac Sodium (VOLTAREN) 1 % GEL Apply 2 g topically 4 (four) times daily. To leg (Patient taking differently: Apply 2 g topically 4 (four) times daily as needed (pain). To leg) 350 g 0   ethyl chloride spray Apply 1 Application topically as needed.     famotidine (PEPCID) 20 MG tablet Take 1 tablet (20 mg total) by mouth daily. 30 tablet 0   insulin degludec (TRESIBA FLEXTOUCH) 100 UNIT/ML FlexTouch Pen Inject 12 Units into the skin at bedtime. (Patient taking differently: Inject 8 Units into the skin at bedtime. Sliding scale) 15 mL 0   insulin lispro (HUMALOG) 100 UNIT/ML KwikPen Inject 5 Units into the skin 3 (three) times daily. (Patient taking differently: Inject 6 Units into the skin 3 (three) times daily.) 15 mL 0   lidocaine-prilocaine (EMLA) cream Apply 1 Application topically daily as needed (port access).     metoCLOPramide (REGLAN) 5 MG tablet Take 5 mg by mouth 3 (three) times daily before meals. 30 min before meals     nitroGLYCERIN (NITROSTAT) 0.4 MG SL tablet Place 1  tablet (0.4 mg total) under the tongue every 5 (five) minutes as needed for chest pain. 30 tablet 12   ondansetron (ZOFRAN) 4 MG tablet Take 4 mg by mouth 2 (two) times daily.     polyethylene glycol (MIRALAX / GLYCOLAX) 17 g packet Take 17 g by mouth daily. (Patient taking differently: Take 17 g by mouth daily as needed for moderate constipation.) 30 each 0   rosuvastatin (CRESTOR) 20 MG tablet Take 20 mg by mouth daily.     senna-docusate (SENOKOT-S) 8.6-50 MG tablet Take 1 tablet by mouth 2 (two) times daily as needed for moderate constipation. (Patient taking differently: Take 1 tablet by mouth 2 (two) times daily as needed for mild constipation or moderate constipation.) 60 tablet 0   Continuous Blood Gluc Sensor (FREESTYLE LIBRE 2 SENSOR) MISC USE AS DIRECTED CHANGE EVERY 14 DAYS      No results found for this or any previous visit (from the past 48 hours). No results found.  Review of Systems  All other systems reviewed and are negative.   Blood pressure (!) 163/69, pulse 80, resp. rate 14, SpO2 98%. Physical Exam Vitals and nursing note reviewed.  Constitutional:      Appearance: Normal appearance. She is normal weight.  HENT:     Head: Normocephalic and atraumatic.     Nose: Nose normal.     Mouth/Throat:     Pharynx: Oropharynx is clear.  Eyes:     Extraocular Movements: Extraocular movements intact.     Conjunctiva/sclera: Conjunctivae normal.  Cardiovascular:     Rate and Rhythm: Normal rate and regular rhythm.  Pulmonary:     Effort: Pulmonary effort is normal.     Breath sounds: Normal breath sounds.  Musculoskeletal:     Cervical back: Normal range of motion and neck supple.     Comments: Right upper arm AV graft is hyper pulsatile with some areas of skin hypopigmentation at cannulation sites.  High-pitched outflow bruit.  Neurological:     Mental Status: She is alert.      Assessment/Plan 1.  Decreased access flows with prolonged cannulation site bleeding:  Shuntogram will be undertaken today to evaluate for culprit lesions and offer management with angioplasty if amenable.  Consent was obtained from the patient after weighing risks and benefits of the procedure. 2.  End-stage renal disease: Resume hemodialysis on MWF schedule following completion of procedure today. 3.  Hypertension: Blood pressure be monitored through procedure with moderate sedation. 4.  Anemia: Resume H/H monitoring as well as ESA per protocol at outpatient dialysis.  Dagoberto Ligas, MD 08/16/2023, 8:01 AM

## 2023-08-19 DIAGNOSIS — N186 End stage renal disease: Secondary | ICD-10-CM | POA: Diagnosis not present

## 2023-08-19 DIAGNOSIS — D509 Iron deficiency anemia, unspecified: Secondary | ICD-10-CM | POA: Diagnosis not present

## 2023-08-19 DIAGNOSIS — D631 Anemia in chronic kidney disease: Secondary | ICD-10-CM | POA: Diagnosis not present

## 2023-08-19 DIAGNOSIS — D689 Coagulation defect, unspecified: Secondary | ICD-10-CM | POA: Diagnosis not present

## 2023-08-19 DIAGNOSIS — Z992 Dependence on renal dialysis: Secondary | ICD-10-CM | POA: Diagnosis not present

## 2023-08-19 DIAGNOSIS — R519 Headache, unspecified: Secondary | ICD-10-CM | POA: Diagnosis not present

## 2023-08-19 DIAGNOSIS — E1122 Type 2 diabetes mellitus with diabetic chronic kidney disease: Secondary | ICD-10-CM | POA: Diagnosis not present

## 2023-08-19 DIAGNOSIS — N2581 Secondary hyperparathyroidism of renal origin: Secondary | ICD-10-CM | POA: Diagnosis not present

## 2023-08-22 DIAGNOSIS — D689 Coagulation defect, unspecified: Secondary | ICD-10-CM | POA: Diagnosis not present

## 2023-08-22 DIAGNOSIS — Z992 Dependence on renal dialysis: Secondary | ICD-10-CM | POA: Diagnosis not present

## 2023-08-22 DIAGNOSIS — E1122 Type 2 diabetes mellitus with diabetic chronic kidney disease: Secondary | ICD-10-CM | POA: Diagnosis not present

## 2023-08-22 DIAGNOSIS — N186 End stage renal disease: Secondary | ICD-10-CM | POA: Diagnosis not present

## 2023-08-22 DIAGNOSIS — N2581 Secondary hyperparathyroidism of renal origin: Secondary | ICD-10-CM | POA: Diagnosis not present

## 2023-08-22 DIAGNOSIS — D509 Iron deficiency anemia, unspecified: Secondary | ICD-10-CM | POA: Diagnosis not present

## 2023-08-22 DIAGNOSIS — R519 Headache, unspecified: Secondary | ICD-10-CM | POA: Diagnosis not present

## 2023-08-22 DIAGNOSIS — D631 Anemia in chronic kidney disease: Secondary | ICD-10-CM | POA: Diagnosis not present

## 2023-08-24 DIAGNOSIS — E1122 Type 2 diabetes mellitus with diabetic chronic kidney disease: Secondary | ICD-10-CM | POA: Diagnosis not present

## 2023-08-24 DIAGNOSIS — R519 Headache, unspecified: Secondary | ICD-10-CM | POA: Diagnosis not present

## 2023-08-24 DIAGNOSIS — D509 Iron deficiency anemia, unspecified: Secondary | ICD-10-CM | POA: Diagnosis not present

## 2023-08-24 DIAGNOSIS — N186 End stage renal disease: Secondary | ICD-10-CM | POA: Diagnosis not present

## 2023-08-24 DIAGNOSIS — Z992 Dependence on renal dialysis: Secondary | ICD-10-CM | POA: Diagnosis not present

## 2023-08-24 DIAGNOSIS — D631 Anemia in chronic kidney disease: Secondary | ICD-10-CM | POA: Diagnosis not present

## 2023-08-24 DIAGNOSIS — D689 Coagulation defect, unspecified: Secondary | ICD-10-CM | POA: Diagnosis not present

## 2023-08-24 DIAGNOSIS — N2581 Secondary hyperparathyroidism of renal origin: Secondary | ICD-10-CM | POA: Diagnosis not present

## 2023-08-26 DIAGNOSIS — D509 Iron deficiency anemia, unspecified: Secondary | ICD-10-CM | POA: Diagnosis not present

## 2023-08-26 DIAGNOSIS — E1122 Type 2 diabetes mellitus with diabetic chronic kidney disease: Secondary | ICD-10-CM | POA: Diagnosis not present

## 2023-08-26 DIAGNOSIS — R519 Headache, unspecified: Secondary | ICD-10-CM | POA: Diagnosis not present

## 2023-08-26 DIAGNOSIS — N2581 Secondary hyperparathyroidism of renal origin: Secondary | ICD-10-CM | POA: Diagnosis not present

## 2023-08-26 DIAGNOSIS — N186 End stage renal disease: Secondary | ICD-10-CM | POA: Diagnosis not present

## 2023-08-26 DIAGNOSIS — D631 Anemia in chronic kidney disease: Secondary | ICD-10-CM | POA: Diagnosis not present

## 2023-08-26 DIAGNOSIS — Z992 Dependence on renal dialysis: Secondary | ICD-10-CM | POA: Diagnosis not present

## 2023-08-26 DIAGNOSIS — D689 Coagulation defect, unspecified: Secondary | ICD-10-CM | POA: Diagnosis not present

## 2023-08-29 DIAGNOSIS — D509 Iron deficiency anemia, unspecified: Secondary | ICD-10-CM | POA: Diagnosis not present

## 2023-08-29 DIAGNOSIS — Z992 Dependence on renal dialysis: Secondary | ICD-10-CM | POA: Diagnosis not present

## 2023-08-29 DIAGNOSIS — D631 Anemia in chronic kidney disease: Secondary | ICD-10-CM | POA: Diagnosis not present

## 2023-08-29 DIAGNOSIS — R519 Headache, unspecified: Secondary | ICD-10-CM | POA: Diagnosis not present

## 2023-08-29 DIAGNOSIS — E1122 Type 2 diabetes mellitus with diabetic chronic kidney disease: Secondary | ICD-10-CM | POA: Diagnosis not present

## 2023-08-29 DIAGNOSIS — D689 Coagulation defect, unspecified: Secondary | ICD-10-CM | POA: Diagnosis not present

## 2023-08-29 DIAGNOSIS — N186 End stage renal disease: Secondary | ICD-10-CM | POA: Diagnosis not present

## 2023-08-29 DIAGNOSIS — N2581 Secondary hyperparathyroidism of renal origin: Secondary | ICD-10-CM | POA: Diagnosis not present

## 2023-08-31 DIAGNOSIS — D631 Anemia in chronic kidney disease: Secondary | ICD-10-CM | POA: Diagnosis not present

## 2023-08-31 DIAGNOSIS — R519 Headache, unspecified: Secondary | ICD-10-CM | POA: Diagnosis not present

## 2023-08-31 DIAGNOSIS — E1122 Type 2 diabetes mellitus with diabetic chronic kidney disease: Secondary | ICD-10-CM | POA: Diagnosis not present

## 2023-08-31 DIAGNOSIS — D509 Iron deficiency anemia, unspecified: Secondary | ICD-10-CM | POA: Diagnosis not present

## 2023-08-31 DIAGNOSIS — D689 Coagulation defect, unspecified: Secondary | ICD-10-CM | POA: Diagnosis not present

## 2023-08-31 DIAGNOSIS — N186 End stage renal disease: Secondary | ICD-10-CM | POA: Diagnosis not present

## 2023-08-31 DIAGNOSIS — N2581 Secondary hyperparathyroidism of renal origin: Secondary | ICD-10-CM | POA: Diagnosis not present

## 2023-08-31 DIAGNOSIS — Z992 Dependence on renal dialysis: Secondary | ICD-10-CM | POA: Diagnosis not present

## 2023-09-02 DIAGNOSIS — D689 Coagulation defect, unspecified: Secondary | ICD-10-CM | POA: Diagnosis not present

## 2023-09-02 DIAGNOSIS — N186 End stage renal disease: Secondary | ICD-10-CM | POA: Diagnosis not present

## 2023-09-02 DIAGNOSIS — D509 Iron deficiency anemia, unspecified: Secondary | ICD-10-CM | POA: Diagnosis not present

## 2023-09-02 DIAGNOSIS — N2581 Secondary hyperparathyroidism of renal origin: Secondary | ICD-10-CM | POA: Diagnosis not present

## 2023-09-02 DIAGNOSIS — D631 Anemia in chronic kidney disease: Secondary | ICD-10-CM | POA: Diagnosis not present

## 2023-09-02 DIAGNOSIS — E1122 Type 2 diabetes mellitus with diabetic chronic kidney disease: Secondary | ICD-10-CM | POA: Diagnosis not present

## 2023-09-02 DIAGNOSIS — Z992 Dependence on renal dialysis: Secondary | ICD-10-CM | POA: Diagnosis not present

## 2023-09-02 DIAGNOSIS — R519 Headache, unspecified: Secondary | ICD-10-CM | POA: Diagnosis not present

## 2023-09-04 ENCOUNTER — Encounter (HOSPITAL_COMMUNITY): Payer: Self-pay

## 2023-09-04 ENCOUNTER — Emergency Department (HOSPITAL_COMMUNITY)

## 2023-09-04 ENCOUNTER — Other Ambulatory Visit: Payer: Self-pay

## 2023-09-04 ENCOUNTER — Emergency Department (HOSPITAL_COMMUNITY)
Admission: EM | Admit: 2023-09-04 | Discharge: 2023-09-04 | Disposition: A | Discharge status: Home / Self Care | Attending: Emergency Medicine | Admitting: Emergency Medicine

## 2023-09-04 DIAGNOSIS — I209 Angina pectoris, unspecified: Secondary | ICD-10-CM | POA: Insufficient documentation

## 2023-09-04 DIAGNOSIS — Z794 Long term (current) use of insulin: Secondary | ICD-10-CM | POA: Diagnosis not present

## 2023-09-04 DIAGNOSIS — Z79899 Other long term (current) drug therapy: Secondary | ICD-10-CM | POA: Insufficient documentation

## 2023-09-04 DIAGNOSIS — I120 Hypertensive chronic kidney disease with stage 5 chronic kidney disease or end stage renal disease: Secondary | ICD-10-CM | POA: Diagnosis not present

## 2023-09-04 DIAGNOSIS — R079 Chest pain, unspecified: Secondary | ICD-10-CM | POA: Diagnosis not present

## 2023-09-04 DIAGNOSIS — N186 End stage renal disease: Secondary | ICD-10-CM | POA: Insufficient documentation

## 2023-09-04 DIAGNOSIS — R739 Hyperglycemia, unspecified: Secondary | ICD-10-CM | POA: Diagnosis not present

## 2023-09-04 DIAGNOSIS — I10 Essential (primary) hypertension: Secondary | ICD-10-CM | POA: Diagnosis not present

## 2023-09-04 DIAGNOSIS — E1122 Type 2 diabetes mellitus with diabetic chronic kidney disease: Secondary | ICD-10-CM | POA: Insufficient documentation

## 2023-09-04 LAB — BASIC METABOLIC PANEL WITH GFR
Anion gap: 11 (ref 5–15)
BUN: 23 mg/dL — ABNORMAL HIGH (ref 6–20)
CO2: 28 mmol/L (ref 22–32)
Calcium: 9.5 mg/dL (ref 8.9–10.3)
Chloride: 95 mmol/L — ABNORMAL LOW (ref 98–111)
Creatinine, Ser: 5.61 mg/dL — ABNORMAL HIGH (ref 0.44–1.00)
GFR, Estimated: 8 mL/min — ABNORMAL LOW (ref >60)
Glucose, Bld: 321 mg/dL — ABNORMAL HIGH (ref 70–99)
Potassium: 5.5 mmol/L — ABNORMAL HIGH (ref 3.5–5.1)
Sodium: 134 mmol/L — ABNORMAL LOW (ref 135–145)

## 2023-09-04 LAB — CBC
HCT: 34.9 % — ABNORMAL LOW (ref 36.0–46.0)
Hemoglobin: 11 g/dL — ABNORMAL LOW (ref 12.0–15.0)
MCH: 29.7 pg (ref 26.0–34.0)
MCHC: 31.5 g/dL (ref 30.0–36.0)
MCV: 94.3 fL (ref 80.0–100.0)
Platelets: 156 10*3/uL (ref 150–400)
RBC: 3.7 MIL/uL — ABNORMAL LOW (ref 3.87–5.11)
RDW: 15 % (ref 11.5–15.5)
WBC: 4.5 10*3/uL (ref 4.0–10.5)
nRBC: 0 % (ref 0.0–0.2)

## 2023-09-04 LAB — TROPONIN I (HIGH SENSITIVITY)
Troponin I (High Sensitivity): 23 ng/L — ABNORMAL HIGH (ref <18)
Troponin I (High Sensitivity): 24 ng/L — ABNORMAL HIGH (ref <18)

## 2023-09-04 MED ORDER — SODIUM ZIRCONIUM CYCLOSILICATE 10 G PO PACK
10.0000 g | PACK | Freq: Once | ORAL | Status: AC
  Administered 2023-09-04: 10 g via ORAL
  Filled 2023-09-04: qty 1

## 2023-09-04 NOTE — Discharge Instructions (Signed)
 We saw you in the ER for the chest pain/shortness of breath. All of our cardiac workup is normal, including labs, EKG and chest X-RAY are normal. We are not sure what is causing your discomfort, but we feel comfortable sending you home at this time. The workup in the ER is not complete, and you should follow up with your primary care doctor for further evaluation.  Please return to the ER if you have worsening chest pain, shortness of breath, pain radiating to your jaw, shoulder, or back, sweats or fainting. Otherwise see the Cardiologist or your primary care doctor as requested.

## 2023-09-04 NOTE — ED Triage Notes (Signed)
 Pt [place] to ED from church when she had sudden onset sharp central chest pain with no radiation. Nausea with no vomiting. Alert and oriented x4. Stroke hx with right sided deficits.  2 doses of nitroglycerin  given by daughter with no relief. 324 of aspirin  given by EMS.

## 2023-09-04 NOTE — ED Notes (Signed)
 Warm blankets given & pt repositioned in bed

## 2023-09-04 NOTE — ED Provider Notes (Signed)
 [place] EMERGENCY DEPARTMENT AT [facility] Provider Note   CSN: [id number] Arrival date & time: 09/04/23  1115     History  Chief Complaint  Patient presents with   Chest Pain    Hannah [name] is a 58 y.o. female.  HPI    58 year old female comes in with chief complaint of chest pain.  Patient has history of hypertension, hyperlipidemia, diabetes, heart attack, stroke, multiple sclerosis, ESRD.  Patient states that she gets infrequent, but off-and-on chest pain normally.  She will take 1 nitro and the pain will dissipate.  Today at church, she started having sharp and stabbing chest pain, in the middle of her chest.  The pain was similar to the pain she had with home, however it did not get better with nitroglycerin  and it was more intense than usual.  The pain lasted for at least 15 minutes.  She took 2 nitroglycerin  without any relief and decided to call EMS.  Review of system is positive for associated nausea without any shortness of breath, sweating.  Home Medications Prior to Admission medications   Medication Sig Start Date End Date Taking? Authorizing Provider  acetaminophen  (TYLENOL ) 325 MG tablet Take 650 mg by mouth every 6 (six) hours as needed for moderate pain.    [provider]  amLODipine  (NORVASC ) 10 MG tablet Take 1 tablet (10 mg total) by mouth daily. Except on dialysis days do not take a dose. 06/08/22   Eilleen Grates, MD  azaTHIOprine (IMURAN) 50 MG tablet Take 50 mg by mouth daily. 04/26/23 04/25/24  [provider]  calcium  acetate (PHOSLO ) 667 MG capsule Take 667 mg by mouth in the morning, at noon, and at bedtime. 11/11/20   [provider]  carvedilol  (COREG ) 25 MG tablet Take 1 tablet (25 mg total) by mouth 2 (two) times daily with a meal. And as directed. 06/08/22   Eilleen Grates, MD  clopidogrel  (PLAVIX ) 75 MG tablet Take 1 tablet (75 mg total) by mouth daily. 11/03/20   Love, Renay Carota, PA-C  Continuous Blood Gluc  Sensor (FREESTYLE LIBRE 2 SENSOR) MISC USE AS DIRECTED CHANGE EVERY 14 DAYS 12/30/20   [provider]  diclofenac  Sodium (VOLTAREN ) 1 % GEL Apply 2 g topically 4 (four) times daily. To leg Patient taking differently: Apply 2 g topically 4 (four) times daily as needed (pain). To leg 11/03/20   Love, Renay Carota, PA-C  ethyl chloride spray Apply 1 Application topically as needed.    [provider]  famotidine  (PEPCID ) 20 MG tablet Take 1 tablet (20 mg total) by mouth daily. 11/03/20   Love, Renay Carota, PA-C  insulin  degludec (TRESIBA  FLEXTOUCH) 100 UNIT/ML FlexTouch Pen Inject 12 Units into the skin at bedtime. Patient taking differently: Inject 8 Units into the skin at bedtime. Sliding scale 11/03/20   Love, Renay Carota, PA-C  insulin  lispro (HUMALOG ) 100 UNIT/ML KwikPen Inject 5 Units into the skin 3 (three) times daily. Patient taking differently: Inject 6 Units into the skin 3 (three) times daily. 11/04/20   Love, Renay Carota, PA-C  lidocaine -prilocaine  (EMLA ) cream Apply 1 Application topically daily as needed (port access). 03/02/22   [provider]  metoCLOPramide  (REGLAN ) 5 MG tablet Take 5 mg by mouth 3 (three) times daily before meals. 30 min before meals 09/16/21   [provider]  nitroGLYCERIN  (NITROSTAT ) 0.4 MG SL tablet Place 1 tablet (0.4 mg total) under the tongue every 5 (five) minutes as needed for chest pain. 06/25/20  Angiulli, Everlyn Hockey, PA-C  ondansetron  (ZOFRAN ) 4 MG tablet Take 4 mg by mouth 2 (two) times daily. 01/07/23   [provider]  polyethylene glycol (MIRALAX  / GLYCOLAX ) 17 g packet Take 17 g by mouth daily. Patient taking differently: Take 17 g by mouth daily as needed for moderate constipation. 11/04/20   Love, Renay Carota, PA-C  rosuvastatin  (CRESTOR ) 20 MG tablet Take 20 mg by mouth daily. 02/05/21   [provider]  senna-docusate (SENOKOT-S) 8.6-50 MG tablet Take 1 tablet by mouth 2 (two) times daily as needed for moderate  constipation. Patient taking differently: Take 1 tablet by mouth 2 (two) times daily as needed for mild constipation or moderate constipation. 11/03/20   Love, Renay Carota, PA-C      Allergies    Ibuprofen, Influenza vaccines, Pneumococcal vaccine, Promethazine, Ambien  [zolpidem ], Codeine, Cyclobenzaprine, Hydrocodone -acetaminophen , Tape, Tramadol  hcl, Wound dressing adhesive, and Oxycodone     Review of Systems   Review of Systems  All other systems reviewed and are negative.   Physical Exam Updated Vital Signs BP (!) 168/68   Pulse 82   Temp 98.2 F (36.8 C) (Oral)   Resp 11   Ht 5\' 2"  (1.575 m)   Wt 46.8 kg   SpO2 98%   BMI 18.87 kg/m  Physical Exam Vitals and nursing note reviewed.  Constitutional:      Appearance: She is well-developed.  HENT:     Head: Atraumatic.  Cardiovascular:     Rate and Rhythm: Normal rate.     Heart sounds: Normal heart sounds.  Pulmonary:     Effort: Pulmonary effort is normal.     Breath sounds: Normal breath sounds.  Musculoskeletal:     Cervical back: Normal range of motion and neck supple.  Skin:    General: Skin is warm and dry.  Neurological:     Mental Status: She is alert and oriented to person, place, and time.     ED Results / Procedures / Treatments   Labs (all labs ordered are listed, but only abnormal results are displayed) Labs Reviewed  BASIC METABOLIC PANEL WITH GFR - Abnormal; Notable for the following components:      Result Value   Sodium 134 (*)    Potassium 5.5 (*)    Chloride 95 (*)    Glucose, Bld 321 (*)    BUN 23 (*)    Creatinine, Ser 5.61 (*)    GFR, Estimated 8 (*)    All other components within normal limits  CBC - Abnormal; Notable for the following components:   RBC 3.70 (*)    Hemoglobin 11.0 (*)    HCT 34.9 (*)    All other components within normal limits  TROPONIN I (HIGH SENSITIVITY) - Abnormal; Notable for the following components:   Troponin I (High Sensitivity) 23 (*)    All other  components within normal limits  TROPONIN I (HIGH SENSITIVITY) - Abnormal; Notable for the following components:   Troponin I (High Sensitivity) 24 (*)    All other components within normal limits    EKG EKG Interpretation Date/Time:  Sunday September 04 2023 11:27:43 EDT Ventricular Rate:  92 PR Interval:  175 QRS Duration:  98 QT Interval:  366 QTC Calculation: 453 R Axis:   20  Text Interpretation: Sinus rhythm Probable left atrial enlargement No acute changes No significant change since last tracing Confirmed by Deatra Face ([id number]) on 09/04/2023 12:02:05 PM  Radiology DG Chest Portable 1 View Result Date: 09/04/2023  CLINICAL DATA:  Chest pain EXAM: PORTABLE CHEST 1 VIEW COMPARISON:  04/29/2023. FINDINGS: Cardiac silhouette appears prominent. No pneumonia or pulmonary edema. No pneumothorax or pleural effusion. IMPRESSION: Enlarged cardiac silhouette. Electronically Signed   By: Sydell Eva M.D.   On: 09/04/2023 14:02    Procedures Procedures    Medications Ordered in ED Medications  sodium zirconium cyclosilicate  (LOKELMA ) packet 10 g (10 g Oral Given 09/04/23 1434)    ED Course/ Medical Decision Making/ A&P                                 Medical Decision Making Amount and/or Complexity of Data Reviewed Labs: ordered. Radiology: ordered.  Risk Prescription drug management.   This patient presents to the ED with chief complaint(s) of sudden, central sharp/stabbing chest pain with pertinent past medical history of CAD, stroke, ESRD, metabolic syndrome, MS.The complaint involves an extensive differential diagnosis and also carries with it a high risk of complications and morbidity.    The differential diagnosis considered for this patient includes  ACS syndrome Aortic dissection CHF exacerbation Valvular disorder Myocarditis Pericarditis Endocarditis Pericardial effusion / tamponade Pneumonia Pleural effusion / Pulmonary  edema PE Pneumothorax Musculoskeletal pain PUD / Gastritis / Esophagitis Esophageal spasm  The initial plan is to get basic labs including delta troponin.  Initial EKG does not show any acute ischemia. Received 2 nitroglycerin  prior to ED arrival.  Currently she is chest pain-free.  She has also done full dose of aspirin .   Additional history obtained: Additional history obtained from EMS  Records reviewed previous admission documents and previous cardiovascular workup .  Patient's last PCI was in 2009.  She had last stress test in 2017.  Independent labs interpretation:  The following labs were independently interpreted: Troponin is 21 followed by 23.  Rest of the labs are reassuring.  Independent visualization and interpretation of imaging: - I independently visualized the following imaging with scope of interpretation limited to determining acute life threatening conditions related to emergency care: X-ray of the chest, which revealed no evidence of pneumothorax.  Treatment and Reassessment: Patient reassessed. Pt is comfortable at this time.  She remains chest pain-free. Results of the workup discussed. Strict ER return precautions discussed. Follow up instruction discussed, and pt agrees with the plan and is comfortable with it.\ Family at the bedside, they were also made aware of the findings.  Consideration for admission or further workup: Admission was considered, however it appears that patient has chronic chest pain of this nature that normally responds to nitro.  This episode was stronger and did not respond to 2 nitros, prompting them to come to the ER. Given normal troponin, patient remaining chest pain-free I feel comfortable discharging her with strict return precautions.  Family concerned what to do if she starts having repeat episode.  Advised return to the ER if not responding to nitro.  Advised following up with cardiologist to see if she needs provocative testing or  improved angina management if the symptoms are progressing.   Final Clinical Impression(s) / ED Diagnoses Final diagnoses:  Angina pectoris (HCC)    Rx / DC Orders ED Discharge Orders          Ordered    Ambulatory referral to Cardiology       Comments: If you have not heard from the Cardiology office within the next 72 hours please call [phone].   09/04/23 1743  Deatra Face, MD 09/04/23 [phone]

## 2023-09-04 NOTE — ED Notes (Signed)
 RN assisted pt getting dressed and wheeled pt to her daughter car without incident.

## 2023-09-04 NOTE — ED Notes (Signed)
 Unsuccessful attempt to collect. Phlebotomy notified to collect lab work as soon as available.

## 2023-09-05 DIAGNOSIS — D509 Iron deficiency anemia, unspecified: Secondary | ICD-10-CM | POA: Diagnosis not present

## 2023-09-05 DIAGNOSIS — E1122 Type 2 diabetes mellitus with diabetic chronic kidney disease: Secondary | ICD-10-CM | POA: Diagnosis not present

## 2023-09-05 DIAGNOSIS — N2581 Secondary hyperparathyroidism of renal origin: Secondary | ICD-10-CM | POA: Diagnosis not present

## 2023-09-05 DIAGNOSIS — D689 Coagulation defect, unspecified: Secondary | ICD-10-CM | POA: Diagnosis not present

## 2023-09-05 DIAGNOSIS — R519 Headache, unspecified: Secondary | ICD-10-CM | POA: Diagnosis not present

## 2023-09-05 DIAGNOSIS — N186 End stage renal disease: Secondary | ICD-10-CM | POA: Diagnosis not present

## 2023-09-05 DIAGNOSIS — Z992 Dependence on renal dialysis: Secondary | ICD-10-CM | POA: Diagnosis not present

## 2023-09-05 DIAGNOSIS — D631 Anemia in chronic kidney disease: Secondary | ICD-10-CM | POA: Diagnosis not present

## 2023-09-07 DIAGNOSIS — Z992 Dependence on renal dialysis: Secondary | ICD-10-CM | POA: Diagnosis not present

## 2023-09-07 DIAGNOSIS — E1122 Type 2 diabetes mellitus with diabetic chronic kidney disease: Secondary | ICD-10-CM | POA: Diagnosis not present

## 2023-09-07 DIAGNOSIS — D689 Coagulation defect, unspecified: Secondary | ICD-10-CM | POA: Diagnosis not present

## 2023-09-07 DIAGNOSIS — D509 Iron deficiency anemia, unspecified: Secondary | ICD-10-CM | POA: Diagnosis not present

## 2023-09-07 DIAGNOSIS — N186 End stage renal disease: Secondary | ICD-10-CM | POA: Diagnosis not present

## 2023-09-07 DIAGNOSIS — R519 Headache, unspecified: Secondary | ICD-10-CM | POA: Diagnosis not present

## 2023-09-07 DIAGNOSIS — D631 Anemia in chronic kidney disease: Secondary | ICD-10-CM | POA: Diagnosis not present

## 2023-09-07 DIAGNOSIS — N2581 Secondary hyperparathyroidism of renal origin: Secondary | ICD-10-CM | POA: Diagnosis not present

## 2023-09-08 DIAGNOSIS — M533 Sacrococcygeal disorders, not elsewhere classified: Secondary | ICD-10-CM | POA: Diagnosis not present

## 2023-09-08 DIAGNOSIS — Z538 Procedure and treatment not carried out for other reasons: Secondary | ICD-10-CM | POA: Diagnosis not present

## 2023-09-08 DIAGNOSIS — Z79899 Other long term (current) drug therapy: Secondary | ICD-10-CM | POA: Diagnosis not present

## 2023-09-09 DIAGNOSIS — N2581 Secondary hyperparathyroidism of renal origin: Secondary | ICD-10-CM | POA: Diagnosis not present

## 2023-09-09 DIAGNOSIS — D509 Iron deficiency anemia, unspecified: Secondary | ICD-10-CM | POA: Diagnosis not present

## 2023-09-09 DIAGNOSIS — D631 Anemia in chronic kidney disease: Secondary | ICD-10-CM | POA: Diagnosis not present

## 2023-09-09 DIAGNOSIS — N186 End stage renal disease: Secondary | ICD-10-CM | POA: Diagnosis not present

## 2023-09-09 DIAGNOSIS — D689 Coagulation defect, unspecified: Secondary | ICD-10-CM | POA: Diagnosis not present

## 2023-09-09 DIAGNOSIS — E1122 Type 2 diabetes mellitus with diabetic chronic kidney disease: Secondary | ICD-10-CM | POA: Diagnosis not present

## 2023-09-09 DIAGNOSIS — Z992 Dependence on renal dialysis: Secondary | ICD-10-CM | POA: Diagnosis not present

## 2023-09-09 DIAGNOSIS — R519 Headache, unspecified: Secondary | ICD-10-CM | POA: Diagnosis not present

## 2023-09-12 DIAGNOSIS — D509 Iron deficiency anemia, unspecified: Secondary | ICD-10-CM | POA: Diagnosis not present

## 2023-09-12 DIAGNOSIS — Z992 Dependence on renal dialysis: Secondary | ICD-10-CM | POA: Diagnosis not present

## 2023-09-12 DIAGNOSIS — N186 End stage renal disease: Secondary | ICD-10-CM | POA: Diagnosis not present

## 2023-09-12 DIAGNOSIS — E1122 Type 2 diabetes mellitus with diabetic chronic kidney disease: Secondary | ICD-10-CM | POA: Diagnosis not present

## 2023-09-12 DIAGNOSIS — D631 Anemia in chronic kidney disease: Secondary | ICD-10-CM | POA: Diagnosis not present

## 2023-09-12 DIAGNOSIS — R519 Headache, unspecified: Secondary | ICD-10-CM | POA: Diagnosis not present

## 2023-09-12 DIAGNOSIS — N2581 Secondary hyperparathyroidism of renal origin: Secondary | ICD-10-CM | POA: Diagnosis not present

## 2023-09-12 DIAGNOSIS — D689 Coagulation defect, unspecified: Secondary | ICD-10-CM | POA: Diagnosis not present

## 2023-09-14 DIAGNOSIS — R519 Headache, unspecified: Secondary | ICD-10-CM | POA: Diagnosis not present

## 2023-09-14 DIAGNOSIS — D631 Anemia in chronic kidney disease: Secondary | ICD-10-CM | POA: Diagnosis not present

## 2023-09-14 DIAGNOSIS — N186 End stage renal disease: Secondary | ICD-10-CM | POA: Diagnosis not present

## 2023-09-14 DIAGNOSIS — D509 Iron deficiency anemia, unspecified: Secondary | ICD-10-CM | POA: Diagnosis not present

## 2023-09-14 DIAGNOSIS — E1022 Type 1 diabetes mellitus with diabetic chronic kidney disease: Secondary | ICD-10-CM | POA: Diagnosis not present

## 2023-09-14 DIAGNOSIS — D689 Coagulation defect, unspecified: Secondary | ICD-10-CM | POA: Diagnosis not present

## 2023-09-14 DIAGNOSIS — E1122 Type 2 diabetes mellitus with diabetic chronic kidney disease: Secondary | ICD-10-CM | POA: Diagnosis not present

## 2023-09-14 DIAGNOSIS — Z992 Dependence on renal dialysis: Secondary | ICD-10-CM | POA: Diagnosis not present

## 2023-09-14 DIAGNOSIS — N2581 Secondary hyperparathyroidism of renal origin: Secondary | ICD-10-CM | POA: Diagnosis not present

## 2023-09-16 DIAGNOSIS — Z992 Dependence on renal dialysis: Secondary | ICD-10-CM | POA: Diagnosis not present

## 2023-09-16 DIAGNOSIS — E1122 Type 2 diabetes mellitus with diabetic chronic kidney disease: Secondary | ICD-10-CM | POA: Diagnosis not present

## 2023-09-16 DIAGNOSIS — N186 End stage renal disease: Secondary | ICD-10-CM | POA: Diagnosis not present

## 2023-09-16 DIAGNOSIS — R519 Headache, unspecified: Secondary | ICD-10-CM | POA: Diagnosis not present

## 2023-09-16 DIAGNOSIS — N2581 Secondary hyperparathyroidism of renal origin: Secondary | ICD-10-CM | POA: Diagnosis not present

## 2023-09-16 DIAGNOSIS — D631 Anemia in chronic kidney disease: Secondary | ICD-10-CM | POA: Diagnosis not present

## 2023-09-16 DIAGNOSIS — D509 Iron deficiency anemia, unspecified: Secondary | ICD-10-CM | POA: Diagnosis not present

## 2023-09-16 DIAGNOSIS — D689 Coagulation defect, unspecified: Secondary | ICD-10-CM | POA: Diagnosis not present

## 2023-09-19 DIAGNOSIS — E1122 Type 2 diabetes mellitus with diabetic chronic kidney disease: Secondary | ICD-10-CM | POA: Diagnosis not present

## 2023-09-19 DIAGNOSIS — N2581 Secondary hyperparathyroidism of renal origin: Secondary | ICD-10-CM | POA: Diagnosis not present

## 2023-09-19 DIAGNOSIS — D689 Coagulation defect, unspecified: Secondary | ICD-10-CM | POA: Diagnosis not present

## 2023-09-19 DIAGNOSIS — D509 Iron deficiency anemia, unspecified: Secondary | ICD-10-CM | POA: Diagnosis not present

## 2023-09-19 DIAGNOSIS — N186 End stage renal disease: Secondary | ICD-10-CM | POA: Diagnosis not present

## 2023-09-19 DIAGNOSIS — R519 Headache, unspecified: Secondary | ICD-10-CM | POA: Diagnosis not present

## 2023-09-19 DIAGNOSIS — D631 Anemia in chronic kidney disease: Secondary | ICD-10-CM | POA: Diagnosis not present

## 2023-09-19 DIAGNOSIS — Z992 Dependence on renal dialysis: Secondary | ICD-10-CM | POA: Diagnosis not present

## 2023-09-21 DIAGNOSIS — D509 Iron deficiency anemia, unspecified: Secondary | ICD-10-CM | POA: Diagnosis not present

## 2023-09-21 DIAGNOSIS — N186 End stage renal disease: Secondary | ICD-10-CM | POA: Diagnosis not present

## 2023-09-21 DIAGNOSIS — E1122 Type 2 diabetes mellitus with diabetic chronic kidney disease: Secondary | ICD-10-CM | POA: Diagnosis not present

## 2023-09-21 DIAGNOSIS — D689 Coagulation defect, unspecified: Secondary | ICD-10-CM | POA: Diagnosis not present

## 2023-09-21 DIAGNOSIS — R519 Headache, unspecified: Secondary | ICD-10-CM | POA: Diagnosis not present

## 2023-09-21 DIAGNOSIS — D631 Anemia in chronic kidney disease: Secondary | ICD-10-CM | POA: Diagnosis not present

## 2023-09-21 DIAGNOSIS — Z992 Dependence on renal dialysis: Secondary | ICD-10-CM | POA: Diagnosis not present

## 2023-09-21 DIAGNOSIS — N2581 Secondary hyperparathyroidism of renal origin: Secondary | ICD-10-CM | POA: Diagnosis not present

## 2023-09-23 ENCOUNTER — Observation Stay (HOSPITAL_COMMUNITY)

## 2023-09-23 ENCOUNTER — Inpatient Hospital Stay (HOSPITAL_COMMUNITY)
Admission: EM | Admit: 2023-09-23 | Discharge: 2023-09-27 | DRG: 252 | Disposition: A | Discharge status: Home Health | Attending: Internal Medicine | Admitting: Internal Medicine

## 2023-09-23 ENCOUNTER — Encounter (HOSPITAL_COMMUNITY): Payer: Self-pay | Admitting: Emergency Medicine

## 2023-09-23 ENCOUNTER — Emergency Department (HOSPITAL_COMMUNITY)

## 2023-09-23 ENCOUNTER — Other Ambulatory Visit: Payer: Self-pay

## 2023-09-23 DIAGNOSIS — I6782 Cerebral ischemia: Secondary | ICD-10-CM | POA: Diagnosis not present

## 2023-09-23 DIAGNOSIS — R4702 Dysphasia: Secondary | ICD-10-CM | POA: Diagnosis not present

## 2023-09-23 DIAGNOSIS — Z79899 Other long term (current) drug therapy: Secondary | ICD-10-CM | POA: Diagnosis not present

## 2023-09-23 DIAGNOSIS — D649 Anemia, unspecified: Secondary | ICD-10-CM | POA: Diagnosis not present

## 2023-09-23 DIAGNOSIS — R29818 Other symptoms and signs involving the nervous system: Secondary | ICD-10-CM | POA: Diagnosis not present

## 2023-09-23 DIAGNOSIS — I69320 Aphasia following cerebral infarction: Secondary | ICD-10-CM

## 2023-09-23 DIAGNOSIS — H3090 Unspecified chorioretinal inflammation, unspecified eye: Secondary | ICD-10-CM | POA: Diagnosis not present

## 2023-09-23 DIAGNOSIS — Y841 Kidney dialysis as the cause of abnormal reaction of the patient, or of later complication, without mention of misadventure at the time of the procedure: Secondary | ICD-10-CM | POA: Diagnosis present

## 2023-09-23 DIAGNOSIS — R519 Headache, unspecified: Secondary | ICD-10-CM | POA: Diagnosis not present

## 2023-09-23 DIAGNOSIS — D62 Acute posthemorrhagic anemia: Secondary | ICD-10-CM | POA: Diagnosis not present

## 2023-09-23 DIAGNOSIS — Z886 Allergy status to analgesic agent status: Secondary | ICD-10-CM | POA: Diagnosis not present

## 2023-09-23 DIAGNOSIS — N2581 Secondary hyperparathyroidism of renal origin: Secondary | ICD-10-CM | POA: Diagnosis not present

## 2023-09-23 DIAGNOSIS — I6381 Other cerebral infarction due to occlusion or stenosis of small artery: Secondary | ICD-10-CM | POA: Diagnosis not present

## 2023-09-23 DIAGNOSIS — E1169 Type 2 diabetes mellitus with other specified complication: Secondary | ICD-10-CM | POA: Diagnosis not present

## 2023-09-23 DIAGNOSIS — I10 Essential (primary) hypertension: Secondary | ICD-10-CM | POA: Diagnosis not present

## 2023-09-23 DIAGNOSIS — Z7902 Long term (current) use of antithrombotics/antiplatelets: Secondary | ICD-10-CM

## 2023-09-23 DIAGNOSIS — G35 Multiple sclerosis: Secondary | ICD-10-CM | POA: Diagnosis not present

## 2023-09-23 DIAGNOSIS — Z888 Allergy status to other drugs, medicaments and biological substances status: Secondary | ICD-10-CM

## 2023-09-23 DIAGNOSIS — I2510 Atherosclerotic heart disease of native coronary artery without angina pectoris: Secondary | ICD-10-CM

## 2023-09-23 DIAGNOSIS — R29810 Facial weakness: Secondary | ICD-10-CM | POA: Diagnosis not present

## 2023-09-23 DIAGNOSIS — E119 Type 2 diabetes mellitus without complications: Secondary | ICD-10-CM

## 2023-09-23 DIAGNOSIS — R2990 Unspecified symptoms and signs involving the nervous system: Secondary | ICD-10-CM | POA: Diagnosis present

## 2023-09-23 DIAGNOSIS — D631 Anemia in chronic kidney disease: Secondary | ICD-10-CM | POA: Diagnosis not present

## 2023-09-23 DIAGNOSIS — Z955 Presence of coronary angioplasty implant and graft: Secondary | ICD-10-CM

## 2023-09-23 DIAGNOSIS — I959 Hypotension, unspecified: Secondary | ICD-10-CM | POA: Diagnosis not present

## 2023-09-23 DIAGNOSIS — D689 Coagulation defect, unspecified: Secondary | ICD-10-CM | POA: Diagnosis not present

## 2023-09-23 DIAGNOSIS — Z992 Dependence on renal dialysis: Secondary | ICD-10-CM | POA: Diagnosis not present

## 2023-09-23 DIAGNOSIS — E785 Hyperlipidemia, unspecified: Secondary | ICD-10-CM | POA: Diagnosis present

## 2023-09-23 DIAGNOSIS — N186 End stage renal disease: Secondary | ICD-10-CM

## 2023-09-23 DIAGNOSIS — I871 Compression of vein: Secondary | ICD-10-CM | POA: Diagnosis not present

## 2023-09-23 DIAGNOSIS — D509 Iron deficiency anemia, unspecified: Secondary | ICD-10-CM | POA: Diagnosis not present

## 2023-09-23 DIAGNOSIS — Z8673 Personal history of transient ischemic attack (TIA), and cerebral infarction without residual deficits: Secondary | ICD-10-CM | POA: Diagnosis not present

## 2023-09-23 DIAGNOSIS — I120 Hypertensive chronic kidney disease with stage 5 chronic kidney disease or end stage renal disease: Secondary | ICD-10-CM | POA: Diagnosis present

## 2023-09-23 DIAGNOSIS — E1122 Type 2 diabetes mellitus with diabetic chronic kidney disease: Secondary | ICD-10-CM | POA: Diagnosis present

## 2023-09-23 DIAGNOSIS — Z887 Allergy status to serum and vaccine status: Secondary | ICD-10-CM | POA: Diagnosis not present

## 2023-09-23 DIAGNOSIS — Z885 Allergy status to narcotic agent status: Secondary | ICD-10-CM | POA: Diagnosis not present

## 2023-09-23 DIAGNOSIS — Z794 Long term (current) use of insulin: Secondary | ICD-10-CM

## 2023-09-23 DIAGNOSIS — Z9861 Coronary angioplasty status: Secondary | ICD-10-CM | POA: Diagnosis not present

## 2023-09-23 DIAGNOSIS — T82838A Hemorrhage of vascular prosthetic devices, implants and grafts, initial encounter: Principal | ICD-10-CM | POA: Diagnosis present

## 2023-09-23 DIAGNOSIS — I69351 Hemiplegia and hemiparesis following cerebral infarction affecting right dominant side: Secondary | ICD-10-CM

## 2023-09-23 DIAGNOSIS — T82898A Other specified complication of vascular prosthetic devices, implants and grafts, initial encounter: Secondary | ICD-10-CM | POA: Diagnosis not present

## 2023-09-23 DIAGNOSIS — R4701 Aphasia: Secondary | ICD-10-CM | POA: Diagnosis not present

## 2023-09-23 DIAGNOSIS — R9082 White matter disease, unspecified: Secondary | ICD-10-CM | POA: Diagnosis not present

## 2023-09-23 DIAGNOSIS — G8929 Other chronic pain: Secondary | ICD-10-CM | POA: Diagnosis present

## 2023-09-23 DIAGNOSIS — I252 Old myocardial infarction: Secondary | ICD-10-CM | POA: Diagnosis not present

## 2023-09-23 LAB — CBC
HCT: 17.5 % — ABNORMAL LOW (ref 36.0–46.0)
Hemoglobin: 5.7 g/dL — CL (ref 12.0–15.0)
MCH: 29.8 pg (ref 26.0–34.0)
MCHC: 32.6 g/dL (ref 30.0–36.0)
MCV: 91.6 fL (ref 80.0–100.0)
Platelets: 107 10*3/uL — ABNORMAL LOW (ref 150–400)
RBC: 1.91 MIL/uL — ABNORMAL LOW (ref 3.87–5.11)
RDW: 14.8 % (ref 11.5–15.5)
WBC: 6.6 10*3/uL (ref 4.0–10.5)
nRBC: 0.3 % — ABNORMAL HIGH (ref 0.0–0.2)

## 2023-09-23 LAB — COMPREHENSIVE METABOLIC PANEL WITH GFR
ALT: 6 U/L (ref 0–44)
AST: 15 U/L (ref 15–41)
Albumin: 3.1 g/dL — ABNORMAL LOW (ref 3.5–5.0)
Alkaline Phosphatase: 40 U/L (ref 38–126)
Anion gap: 11 (ref 5–15)
BUN: 12 mg/dL (ref 6–20)
CO2: 30 mmol/L (ref 22–32)
Calcium: 8.1 mg/dL — ABNORMAL LOW (ref 8.9–10.3)
Chloride: 96 mmol/L — ABNORMAL LOW (ref 98–111)
Creatinine, Ser: 2.32 mg/dL — ABNORMAL HIGH (ref 0.44–1.00)
GFR, Estimated: 24 mL/min — ABNORMAL LOW (ref >60)
Glucose, Bld: 121 mg/dL — ABNORMAL HIGH (ref 70–99)
Potassium: 3.5 mmol/L (ref 3.5–5.1)
Sodium: 137 mmol/L (ref 135–145)
Total Bilirubin: 0.6 mg/dL (ref 0.0–1.2)
Total Protein: 6.4 g/dL — ABNORMAL LOW (ref 6.5–8.1)

## 2023-09-23 LAB — CBG MONITORING, ED: Glucose-Capillary: 117 mg/dL — ABNORMAL HIGH (ref 70–99)

## 2023-09-23 LAB — I-STAT CHEM 8, ED
BUN: 14 mg/dL (ref 6–20)
Calcium, Ion: 0.97 mmol/L — ABNORMAL LOW (ref 1.15–1.40)
Chloride: 92 mmol/L — ABNORMAL LOW (ref 98–111)
Creatinine, Ser: 2.5 mg/dL — ABNORMAL HIGH (ref 0.44–1.00)
Glucose, Bld: 119 mg/dL — ABNORMAL HIGH (ref 70–99)
HCT: 17 % — ABNORMAL LOW (ref 36.0–46.0)
Hemoglobin: 5.8 g/dL — CL (ref 12.0–15.0)
Potassium: 3.4 mmol/L — ABNORMAL LOW (ref 3.5–5.1)
Sodium: 136 mmol/L (ref 135–145)
TCO2: 32 mmol/L (ref 22–32)

## 2023-09-23 LAB — DIFFERENTIAL
Abs Immature Granulocytes: 0.05 10*3/uL (ref 0.00–0.07)
Basophils Absolute: 0 10*3/uL (ref 0.0–0.1)
Basophils Relative: 0 %
Eosinophils Absolute: 0.2 10*3/uL (ref 0.0–0.5)
Eosinophils Relative: 2 %
Immature Granulocytes: 1 %
Lymphocytes Relative: 13 %
Lymphs Abs: 0.9 10*3/uL (ref 0.7–4.0)
Monocytes Absolute: 0.3 10*3/uL (ref 0.1–1.0)
Monocytes Relative: 5 %
Neutro Abs: 5.1 10*3/uL (ref 1.7–7.7)
Neutrophils Relative %: 79 %

## 2023-09-23 LAB — APTT: aPTT: 30 s (ref 24–36)

## 2023-09-23 LAB — PROTIME-INR
INR: 1.1 (ref 0.8–1.2)
Prothrombin Time: 14.7 s (ref 11.4–15.2)

## 2023-09-23 LAB — GLUCOSE, CAPILLARY: Glucose-Capillary: 139 mg/dL — ABNORMAL HIGH (ref 70–99)

## 2023-09-23 LAB — ETHANOL: Alcohol, Ethyl (B): <15 mg/dL (ref <15)

## 2023-09-23 LAB — PREPARE RBC (CROSSMATCH)

## 2023-09-23 MED ORDER — ROSUVASTATIN CALCIUM 20 MG PO TABS
20.0000 mg | ORAL_TABLET | Freq: Every day | ORAL | Status: DC
  Administered 2023-09-23 – 2023-09-27 (×4): 20 mg via ORAL
  Filled 2023-09-23 (×5): qty 1

## 2023-09-23 MED ORDER — SODIUM CHLORIDE 0.9% FLUSH
3.0000 mL | Freq: Once | INTRAVENOUS | Status: AC
  Administered 2023-09-23: 3 mL via INTRAVENOUS

## 2023-09-23 MED ORDER — LORAZEPAM 0.5 MG PO TABS: 0.5000 mg | ORAL_TABLET | Freq: Once | ORAL | Status: DC | PRN

## 2023-09-23 MED ORDER — INSULIN ASPART 100 UNIT/ML IJ SOLN
0.0000 [IU] | Freq: Three times a day (TID) | INTRAMUSCULAR | Status: DC
  Administered 2023-09-24: 1 [IU] via SUBCUTANEOUS
  Administered 2023-09-24: 2 [IU] via SUBCUTANEOUS
  Administered 2023-09-25 (×2): 1 [IU] via SUBCUTANEOUS
  Administered 2023-09-25: 2 [IU] via SUBCUTANEOUS
  Administered 2023-09-27: 1 [IU] via SUBCUTANEOUS

## 2023-09-23 MED ORDER — ONDANSETRON HCL 4 MG/2ML IJ SOLN: 4.0000 mg | Freq: Four times a day (QID) | INTRAMUSCULAR | Status: DC | PRN

## 2023-09-23 MED ORDER — AZATHIOPRINE 50 MG PO TABS
50.0000 mg | ORAL_TABLET | Freq: Every day | ORAL | Status: DC
Start: 2023-09-24 — End: 2023-09-27
  Administered 2023-09-24 – 2023-09-27 (×3): 50 mg via ORAL
  Filled 2023-09-23 (×4): qty 1

## 2023-09-23 MED ORDER — ONDANSETRON HCL 4 MG PO TABS: 4.0000 mg | ORAL_TABLET | Freq: Four times a day (QID) | ORAL | Status: DC | PRN

## 2023-09-23 MED ORDER — CLOPIDOGREL BISULFATE 75 MG PO TABS
75.0000 mg | ORAL_TABLET | Freq: Every day | ORAL | Status: DC
  Administered 2023-09-24 – 2023-09-27 (×3): 75 mg via ORAL
  Filled 2023-09-23 (×4): qty 1

## 2023-09-23 MED ORDER — LORAZEPAM 2 MG/ML IJ SOLN
1.0000 mg | Freq: Once | INTRAMUSCULAR | Status: AC
  Administered 2023-09-23: 1 mg via INTRAVENOUS
  Filled 2023-09-23: qty 1

## 2023-09-23 MED ORDER — PANTOPRAZOLE SODIUM 40 MG IV SOLR
40.0000 mg | Freq: Once | INTRAVENOUS | Status: AC
  Administered 2023-09-23: 40 mg via INTRAVENOUS
  Filled 2023-09-23: qty 10

## 2023-09-23 MED ORDER — AMLODIPINE BESYLATE 10 MG PO TABS
10.0000 mg | ORAL_TABLET | Freq: Every day | ORAL | Status: DC
  Administered 2023-09-24 – 2023-09-27 (×3): 10 mg via ORAL
  Filled 2023-09-23 (×4): qty 1

## 2023-09-23 MED ORDER — CARVEDILOL 25 MG PO TABS
25.0000 mg | ORAL_TABLET | Freq: Two times a day (BID) | ORAL | Status: DC
  Administered 2023-09-24 – 2023-09-27 (×6): 25 mg via ORAL
  Filled 2023-09-23 (×7): qty 1

## 2023-09-23 MED ORDER — INSULIN ASPART 100 UNIT/ML IJ SOLN
0.0000 [IU] | Freq: Every day | INTRAMUSCULAR | Status: DC
  Administered 2023-09-24: 2 [IU] via SUBCUTANEOUS

## 2023-09-23 MED ORDER — ACETAMINOPHEN 650 MG RE SUPP: 650.0000 mg | Freq: Four times a day (QID) | RECTAL | Status: DC | PRN

## 2023-09-23 MED ORDER — ACETAMINOPHEN 325 MG PO TABS: 650.0000 mg | ORAL_TABLET | Freq: Four times a day (QID) | ORAL | Status: DC | PRN

## 2023-09-23 MED ORDER — INSULIN GLARGINE-YFGN 100 UNIT/ML ~~LOC~~ SOLN
8.0000 [IU] | Freq: Every day | SUBCUTANEOUS | Status: DC
  Administered 2023-09-23 – 2023-09-25 (×3): 8 [IU] via SUBCUTANEOUS
  Filled 2023-09-23 (×5): qty 0.08

## 2023-09-23 MED ORDER — SODIUM CHLORIDE 0.9% IV SOLUTION: Freq: Once | INTRAVENOUS | Status: AC

## 2023-09-23 NOTE — ED Provider Notes (Signed)
 [place] EMERGENCY DEPARTMENT AT [facility] Provider Note   CSN: [id number] Arrival date & time: 09/23/23  1124  An emergency department physician performed an initial assessment on this suspected stroke patient at 1124.  History  Chief Complaint  Patient presents with   Code Stroke    Hannah [name] is a 58 y.o. female.  58 yo F with a chief complaint of right-sided weakness and aphasia.  This was noticed while she was at dialysis today.  Arrived here as a code stroke.  Level 5 caveat acuity of condition.        Home Medications Prior to Admission medications   Medication Sig Start Date End Date Taking? Authorizing Provider  acetaminophen  (TYLENOL ) 500 MG tablet Take 500 mg by mouth every 6 (six) hours as needed for mild pain (pain score 1-3), moderate pain (pain score 4-6), fever or headache.   Yes [provider]  amLODipine  (NORVASC ) 10 MG tablet Take 1 tablet (10 mg total) by mouth daily. Except on dialysis days do not take a dose. 06/08/22  Yes Eilleen Grates, MD  azaTHIOprine (IMURAN) 50 MG tablet Take 50 mg by mouth daily. 04/26/23 04/25/24 Yes [provider]  calcium  acetate (PHOSLO ) 667 MG capsule Take 667 mg by mouth in the morning, at noon, and at bedtime. 11/11/20  Yes [provider]  carvedilol  (COREG ) 25 MG tablet Take 1 tablet (25 mg total) by mouth 2 (two) times daily with a meal. And as directed. 06/08/22  Yes Eilleen Grates, MD  clopidogrel  (PLAVIX ) 75 MG tablet Take 1 tablet (75 mg total) by mouth daily. 11/03/20  Yes Love, Renay Carota, PA-C  diclofenac  Sodium (VOLTAREN ) 1 % GEL Apply 2 g topically 4 (four) times daily. To leg Patient taking differently: Apply 2 g topically 4 (four) times daily as needed (pain). To leg 11/03/20  Yes Love, Renay Carota, PA-C  famotidine  (PEPCID ) 20 MG tablet Take 1 tablet (20 mg total) by mouth daily. 11/03/20  Yes Love, Renay Carota, PA-C  insulin  degludec (TRESIBA  FLEXTOUCH) 100 UNIT/ML FlexTouch Pen  Inject 12 Units into the skin at bedtime. Patient taking differently: Inject 8 Units into the skin at bedtime. 11/03/20  Yes Love, Renay Carota, PA-C  insulin  lispro (HUMALOG ) 100 UNIT/ML KwikPen Inject 5 Units into the skin 3 (three) times daily. Patient taking differently: Inject 4-6 Units into the skin 3 (three) times daily. 11/04/20  Yes Love, Renay Carota, PA-C  metoCLOPramide  (REGLAN ) 5 MG tablet Take 5 mg by mouth every 6 (six) hours as needed for nausea or vomiting. 09/16/21  Yes [provider]  nitroGLYCERIN  (NITROSTAT ) 0.4 MG SL tablet Place 1 tablet (0.4 mg total) under the tongue every 5 (five) minutes as needed for chest pain. 06/25/20  Yes Angiulli, Everlyn Hockey, PA-C  ondansetron  (ZOFRAN ) 4 MG tablet Take 4 mg by mouth every 8 (eight) hours as needed for nausea or vomiting. 01/07/23  Yes [provider]  polyethylene glycol (MIRALAX  / GLYCOLAX ) 17 g packet Take 17 g by mouth daily. Patient taking differently: Take 17 g by mouth daily as needed for moderate constipation. 11/04/20  Yes Love, Renay Carota, PA-C  rosuvastatin  (CRESTOR ) 20 MG tablet Take 20 mg by mouth daily. 02/05/21  Yes [provider]  senna-docusate (SENOKOT-S) 8.6-50 MG tablet Take 1 tablet by mouth 2 (two) times daily as needed for moderate constipation. Patient taking differently: Take 1 tablet by mouth 2 (two) times daily as needed for mild constipation or moderate constipation. 11/03/20  Yes Love, Renay Carota, PA-C  Continuous Blood Gluc Sensor (FREESTYLE LIBRE 2 SENSOR) MISC USE AS DIRECTED CHANGE EVERY 14 DAYS 12/30/20   [provider]  lidocaine -prilocaine  (EMLA ) cream Apply 1 Application topically daily as needed (port access). 03/02/22   [provider]      Allergies    Ibuprofen, Influenza vaccines, Pneumococcal vaccine, Promethazine, Ambien  [zolpidem ], Codeine, Cyclobenzaprine, Hydrocodone -acetaminophen , Tape, Tramadol  hcl, Wound dressing adhesive, and Oxycodone     Review of Systems    Review of Systems  Physical Exam Updated Vital Signs BP (!) 143/65 (BP Location: Left Arm)   Pulse 90   Temp 98.2 F (36.8 C)   Resp 18   Ht 5\' 2"  (1.575 m)   Wt 49 kg   BMI 19.76 kg/m  Physical Exam Vitals and nursing note reviewed.  Constitutional:      General: She is not in acute distress.    Appearance: She is well-developed. She is not diaphoretic.  HENT:     Head: Normocephalic and atraumatic.  Eyes:     Pupils: Pupils are equal, round, and reactive to light.  Cardiovascular:     Rate and Rhythm: Normal rate and regular rhythm.     Heart sounds: No murmur heard.    No friction rub. No gallop.  Pulmonary:     Effort: Pulmonary effort is normal.     Breath sounds: No wheezing or rales.  Abdominal:     General: There is no distension.     Palpations: Abdomen is soft.     Tenderness: There is no abdominal tenderness.  Musculoskeletal:        General: No tenderness.     Cervical back: Normal range of motion and neck supple.  Skin:    General: Skin is warm and dry.  Neurological:     Mental Status: She is alert and oriented to person, place, and time.  Psychiatric:        Behavior: Behavior normal.     ED Results / Procedures / Treatments   Labs (all labs ordered are listed, but only abnormal results are displayed) Labs Reviewed  CBC - Abnormal; Notable for the following components:      Result Value   RBC 1.91 (*)    Hemoglobin 5.7 (*)    HCT 17.5 (*)    Platelets 107 (*)    nRBC 0.3 (*)    All other components within normal limits  COMPREHENSIVE METABOLIC PANEL WITH GFR - Abnormal; Notable for the following components:   Chloride 96 (*)    Glucose, Bld 121 (*)    Creatinine, Ser 2.32 (*)    Calcium  8.1 (*)    Total Protein 6.4 (*)    Albumin 3.1 (*)    GFR, Estimated 24 (*)    All other components within normal limits  I-STAT CHEM 8, ED - Abnormal; Notable for the following components:   Potassium 3.4 (*)    Chloride 92 (*)    Creatinine, Ser  2.50 (*)    Glucose, Bld 119 (*)    Calcium , Ion 0.97 (*)    Hemoglobin 5.8 (*)    HCT 17.0 (*)    All other components within normal limits  CBG MONITORING, ED - Abnormal; Notable for the following components:   Glucose-Capillary 117 (*)    All other components within normal limits  DIFFERENTIAL  ETHANOL  PROTIME-INR  APTT  POC OCCULT BLOOD, ED  TYPE AND SCREEN  PREPARE RBC (CROSSMATCH)    EKG  None  Radiology CT HEAD CODE STROKE WO CONTRAST Result Date: 09/23/2023 CLINICAL DATA:  Code stroke. Neuro deficit, acute, stroke suspected. EXAM: CT HEAD WITHOUT CONTRAST TECHNIQUE: Contiguous axial images were obtained from the base of the skull through the vertex without intravenous contrast. RADIATION DOSE REDUCTION: This exam was performed according to the departmental dose-optimization program which includes automated exposure control, adjustment of the mA and/or kV according to patient size and/or use of iterative reconstruction technique. COMPARISON:  Brain MRI 05/05/2022.  Head CT 05/05/2022. FINDINGS: Brain: Mild generalized cerebral atrophy. Lacunar infarct within the right corona radiata, new from prior exams but chronic in appearance (series 5, image 40). Chronic lacunar infarcts within/about the bilateral deep gray nuclei, not appreciably changed from the prior brain MRI of 05/05/2022. Background age-advanced cerebral white matter disease. There is no acute intracranial hemorrhage. No demarcated cortical infarct. No extra-axial fluid collection. No evidence of an intracranial mass. No midline shift. Vascular: No hyperdense vessel.  Atherosclerotic calcifications. Skull: No calvarial fracture or aggressive osseous lesion. Sinuses/Orbits: No mass or acute finding within the imaged orbits. 11 mm mucous retention cyst within the left sphenoid sinus. ASPECTS [facility] Stroke Program Early CT Score) - Ganglionic level infarction (caudate, lentiform nuclei, internal capsule, insula, M1-M3 cortex):  7 - Supraganglionic infarction (M4-M6 cortex): 3 Total score (0-10 with 10 being normal): 10 (when discounting chronic infarcts). No evidence of an acute intracranial abnormality. These results were communicated to Dr. Murvin Arthurs At 11:46 amon 5/9/2025by text page via the [facility] messaging system. IMPRESSION: 1. No evidence of an acute intracranial abnormality. 2. Lacunar infarct within the right corona radiata, new from the prior brain MRI of 05/05/2022 but chronic in appearance. 3. Unchanged chronic lacunar infarcts within/about the bilateral deep gray nuclei. 4. Background age-advanced cerebral white matter disease. This likely at least partly reflects chronic small vessel ischemia. However, a history of potential multiple sclerosis was noted on prior radiology examinations and a component of demyelinating disease is possible. 5. Mild generalized cerebral atrophy. 6. 11 mm left sphenoid sinus mucous retention cyst. Electronically Signed   By: Bascom Lily D.O.   On: 09/23/2023 11:49    Procedures .Critical Care  Performed by: Albertus Hughs, DO Authorized by: Albertus Hughs, DO   Critical care provider statement:    Critical care time (minutes):  35   Critical care time was exclusive of:  Separately billable procedures and treating other patients   Critical care was time spent personally by me on the following activities:  Development of treatment plan with patient or surrogate, discussions with consultants, evaluation of patient's response to treatment, examination of patient, ordering and review of laboratory studies, ordering and review of radiographic studies, ordering and performing treatments and interventions, pulse oximetry, re-evaluation of patient's condition and review of old charts   Care discussed with: admitting provider       Medications Ordered in ED Medications  0.9 %  sodium chloride  infusion (Manually program via Guardrails IV Fluids) (has no administration in time range)  sodium  chloride flush (NS) 0.9 % injection 3 mL (3 mLs Intravenous Given 09/23/23 1255)  pantoprazole (PROTONIX) injection 40 mg (40 mg Intravenous Given 09/23/23 1254)    ED Course/ Medical Decision Making/ A&P                                 Medical Decision Making Amount and/or Complexity of Data Reviewed Labs: ordered. Radiology: ordered.  Risk  Prescription drug management. Decision regarding hospitalization.   58 yo F with a chief complaints of acute onset right-sided weakness and aphasia.  Arrived here as a code stroke.  Airway was clear to the bridge and she was taken urgently to CT.  Hbg 5.7 with a significant drop from last check.  Patient feels like she has had some bleeding from her AV fistula off and on.  Denies other areas of bleeding.  Rectal exam with no hemorrhoids, no gross blood.  No obvious stool in the vault.  Discussed case with neurology, thought to be unlikely to be an acute stroke.  Did recommend an MRI.  Recommended medical admission.  The patients results and plan were reviewed and discussed.   Any x-rays performed were independently reviewed by myself.   Differential diagnosis were considered with the presenting HPI.  Medications  0.9 %  sodium chloride  infusion (Manually program via Guardrails IV Fluids) (has no administration in time range)  sodium chloride  flush (NS) 0.9 % injection 3 mL (3 mLs Intravenous Given 09/23/23 1255)  pantoprazole (PROTONIX) injection 40 mg (40 mg Intravenous Given 09/23/23 1254)    Vitals:   09/23/23 1100 09/23/23 1145 09/23/23 1147  BP:  (!) 143/65 (!) 143/65  Pulse:   90  Resp:  (!) 22 18  Temp:   98.2 F (36.8 C)  Weight: 49 kg    Height: 5\' 2"  (1.575 m)      Final diagnoses:  Symptomatic anemia    Admission/ observation were discussed with the admitting physician, patient and/or family and they are comfortable with the plan.          Final Clinical Impression(s) / ED Diagnoses Final diagnoses:  Symptomatic  anemia    Rx / DC Orders ED Discharge Orders     None         Albertus Hughs, DO 09/23/23 1347

## 2023-09-23 NOTE — Code Documentation (Signed)
 Stroke Response Nurse Documentation Code Documentation  Davyn Schwaller is a 58 y.o. female arriving to [facility]  via [facility] on 09/23/2023 with past medical hx of ESRD on dialysis, DM, MS, debility. On clopidogrel  75 mg daily. Code stroke was activated by EMS.   Patient from dialysis where she was LKW at 10:17 and now complaining of Aphahia, dysarthria, rt droop and rt drift.  Stroke team at the bedside on patient arrival. Labs drawn and patient cleared for CT by EDP Patient to CT with team. NIHSS 5, see documentation for details and code stroke times. Patient with disoriented, right facial droop, right arm weakness, and right leg weakness on exam. The following imaging was completed:  CT Head. Patient is not a candidate for IV Thrombolytic due to stroke not suspected, pt near or at baseline. Patient is not a candidate for IR due to Stroke not suspected, pt at or near baseline.   Care Plan:NIHSS and VS q 2 x 12 then q 4   Bedside handoff with ED RN complete.  Daren Doswell Livengood  Stroke Response RN

## 2023-09-23 NOTE — Assessment & Plan Note (Signed)
-   Continue Plavix , Crestor , carvedilol

## 2023-09-23 NOTE — Assessment & Plan Note (Signed)
-   Continue Tresiba  - Sliding scale corrections

## 2023-09-23 NOTE — Assessment & Plan Note (Signed)
 Continue azathioprine

## 2023-09-23 NOTE — Hospital Course (Addendum)
 58 y.o. F with ESRD on HD, HTN, CAD s/p remote PCI, retinitis on azathioprine, DM, stroke with residual R hemiparesis and dysarthria, chronic pain who presented with worsening of her right hemiparesis and dysarthria after dialysis.  NIHSS 5.    Found to have Hgb 5.7  Stroke team evaluated and suspected recrudescence of symptoms due to anemia, but recommended MRI.  Denied rectal bleeding, but did note on and off problems with fistula bleeding for a while, corroborated by chart.  CMP normal for ESRD.  WBC normal, Hgb 5.7, diff normal, plateelts 107  Glucose normal, INR 1.1, PTT normal. CTH lacunar infarcts old.  ECG NSR.

## 2023-09-23 NOTE — Assessment & Plan Note (Signed)
 Blood pressure elevated - Continue amlodipine , carvedilol 

## 2023-09-23 NOTE — Assessment & Plan Note (Signed)
-   Consult nephrology for HD - Aranesp  per nephrology - Notification to nephrology sent

## 2023-09-23 NOTE — Assessment & Plan Note (Signed)
 Continue Crestor

## 2023-09-23 NOTE — ED Notes (Signed)
 Spoke with megan in MRI. Taking pt over to MRI and transport team will take her to the floor afterwards.

## 2023-09-23 NOTE — ED Notes (Signed)
 Received call from blood blank that 2 units PRBCs ready.  This RN had not received report from previous ED nurse.  This RN obtained report on pt.

## 2023-09-23 NOTE — Consult Note (Signed)
 [facility]  Patient name: Hannah [name] MRN: [id number] DOB: [birth year] Sex: female   REQUESTING PROVIDER:    ER   REASON FOR CONSULT:    Fistula bleeding  HISTORY OF PRESENT ILLNESS:   Hannah [name] is a 58 y.o. female, who is status post right brachiobasilic fistula by Dr. Vikki Graves in 2022 she underwent revision with a interposition graft on 05/28/2022 by Dr. Fulton Job.  She had venoplasty of a right innominate stenosis by Dr. Lydia Sams on 08/16/2023.  The patient and her daughter state that she has oozing from her graft following dialysis.  This has been going on for over a month.  She did not get any significant benefit from her shuntogram  The patient presented as a code stroke today with right sided hemiparesis (chronic) and expressive aphasia.  A MRI is pending.  Neurohospitalist service following.  PAST MEDICAL HISTORY    Past Medical History:  Diagnosis Date   CAD S/P PCI x 2 2009   MI 2009 -> PCI x 2 ([place]); 01/2016 Nuc ST Nonischemic with Normal EF   Cerebral infarction (HCC) 07/12/2017   2009 first one, has had a total of 4 stokes- last 2021   Chronic kidney disease    Hemo M-W- F   Diabetes type 2, controlled (HCC)    Hemiparesis affecting right side as late effect of cerebrovascular accident (CVA) (HCC)    Hyperlipidemia    Hypertension    Intractable vomiting with nausea 07/12/2017   Multiple sclerosis (HCC) 2009   Myocardial infarction (HCC) 2009   Stroke [facility])      FAMILY HISTORY   History reviewed. No pertinent family history.  SOCIAL HISTORY:   Social History   Socioeconomic History   Marital status: Divorced    Spouse name: Not on file   Number of children: Not on file   Years of education: Not on file   Highest education level: Not on file  Occupational History   Occupation: disabled  Tobacco Use   Smoking status: Never   Smokeless tobacco: Never  Vaping Use   Vaping status:  Never Used  Substance and Sexual Activity   Alcohol use: Not Currently   Drug use: Never   Sexual activity: Not on file  Other Topics Concern   Not on file  Social History Narrative   Not on file   Social Drivers of Health   Financial Resource Strain: Not on file  Food Insecurity: No Food Insecurity (11/10/2020)   Hunger Vital Sign    Worried About Running Out of Food in the Last Year: Never true    Ran Out of Food in the Last Year: Never true  Transportation Needs: No Transportation Needs (11/10/2020)   PRAPARE - Administrator, Civil Service (Medical): No    Lack of Transportation (Non-Medical): No  Physical Activity: Not on file  Stress: Not on file  Social Connections: Not on file  Intimate Partner Violence: Not At Risk (09/08/2023)   Received from [facility]   HITS    Over the last 12 months how often did your partner physically hurt you?: Never    Over the last 12 months how often did your partner insult you or talk down to you?: Never    Over the last 12 months how often did your partner threaten you with physical harm?: Never    Over the last 12 months how often did your partner scream or curse at you?: Never  ALLERGIES:    Allergies  Allergen Reactions   Ibuprofen Swelling    Swelling to hands, feet and face    Influenza Vaccines Anaphylaxis and Swelling    fever   Pneumococcal Vaccine Anaphylaxis and Swelling    fever   Promethazine Other (See Comments)    hallucinations   Ambien  [Zolpidem ] Other (See Comments)    Unknown reaction   Codeine Itching   Cyclobenzaprine Other (See Comments)    hallucinations    Hydrocodone -Acetaminophen  Itching   Tape Itching   Tramadol  Hcl Other (See Comments)    hallucinations   Wound Dressing Adhesive Itching    Use paper tape   Oxycodone  Nausea And Vomiting    CURRENT MEDICATIONS:    No current facility-administered medications for this encounter.   Current Outpatient Medications  Medication  Sig Dispense Refill   acetaminophen  (TYLENOL ) 500 MG tablet Take 500 mg by mouth every 6 (six) hours as needed for mild pain (pain score 1-3), moderate pain (pain score 4-6), fever or headache.     amLODipine  (NORVASC ) 10 MG tablet Take 1 tablet (10 mg total) by mouth daily. Except on dialysis days do not take a dose. 30 tablet 0   azaTHIOprine (IMURAN) 50 MG tablet Take 50 mg by mouth daily.     calcium  acetate (PHOSLO ) 667 MG capsule Take 667 mg by mouth in the morning, at noon, and at bedtime.     carvedilol  (COREG ) 25 MG tablet Take 1 tablet (25 mg total) by mouth 2 (two) times daily with a meal. And as directed. 60 tablet 0   clopidogrel  (PLAVIX ) 75 MG tablet Take 1 tablet (75 mg total) by mouth daily. 30 tablet 0   diclofenac  Sodium (VOLTAREN ) 1 % GEL Apply 2 g topically 4 (four) times daily. To leg (Patient taking differently: Apply 2 g topically 4 (four) times daily as needed (pain). To leg) 350 g 0   famotidine  (PEPCID ) 20 MG tablet Take 1 tablet (20 mg total) by mouth daily. 30 tablet 0   insulin  degludec (TRESIBA  FLEXTOUCH) 100 UNIT/ML FlexTouch Pen Inject 12 Units into the skin at bedtime. (Patient taking differently: Inject 8 Units into the skin at bedtime.) 15 mL 0   insulin  lispro (HUMALOG ) 100 UNIT/ML KwikPen Inject 5 Units into the skin 3 (three) times daily. (Patient taking differently: Inject 4-6 Units into the skin 3 (three) times daily.) 15 mL 0   metoCLOPramide  (REGLAN ) 5 MG tablet Take 5 mg by mouth every 6 (six) hours as needed for nausea or vomiting.     nitroGLYCERIN  (NITROSTAT ) 0.4 MG SL tablet Place 1 tablet (0.4 mg total) under the tongue every 5 (five) minutes as needed for chest pain. 30 tablet 12   ondansetron  (ZOFRAN ) 4 MG tablet Take 4 mg by mouth every 8 (eight) hours as needed for nausea or vomiting.     polyethylene glycol (MIRALAX  / GLYCOLAX ) 17 g packet Take 17 g by mouth daily. (Patient taking differently: Take 17 g by mouth daily as needed for moderate  constipation.) 30 each 0   rosuvastatin  (CRESTOR ) 20 MG tablet Take 20 mg by mouth daily.     senna-docusate (SENOKOT-S) 8.6-50 MG tablet Take 1 tablet by mouth 2 (two) times daily as needed for moderate constipation. (Patient taking differently: Take 1 tablet by mouth 2 (two) times daily as needed for mild constipation or moderate constipation.) 60 tablet 0   Continuous Blood Gluc Sensor (FREESTYLE LIBRE 2 SENSOR) MISC USE AS DIRECTED CHANGE EVERY 14  DAYS     lidocaine -prilocaine  (EMLA ) cream Apply 1 Application topically daily as needed (port access).      REVIEW OF SYSTEMS:   [X]  denotes positive finding, [ ]  denotes negative finding Cardiac  Comments:  Chest pain or chest pressure:    Shortness of breath upon exertion:    Short of breath when lying flat:    Irregular heart rhythm:        Vascular    Pain in calf, thigh, or hip brought on by ambulation:    Pain in feet at night that wakes you up from your sleep:     Blood clot in your veins:    Leg swelling:         Pulmonary    Oxygen at home:    Productive cough:     Wheezing:         Neurologic    Sudden weakness in arms or legs:     Sudden numbness in arms or legs:     Sudden onset of difficulty speaking or slurred speech:    Temporary loss of vision in one eye:     Problems with dizziness:         Gastrointestinal    Blood in stool:      Vomited blood:         Genitourinary    Burning when urinating:     Blood in urine:        Psychiatric    Major depression:         Hematologic    Bleeding problems:    Problems with blood clotting too easily:        Skin    Rashes or ulcers:        Constitutional    Fever or chills:     PHYSICAL EXAM:   Vitals:   09/23/23 1100 09/23/23 1145 09/23/23 1147 09/23/23 1519  BP:  (!) 143/65 (!) 143/65 (!) 155/61  Pulse:   90 94  Resp:  (!) 22 18 16   Temp:   98.2 F (36.8 C) 98.2 F (36.8 C)  TempSrc:    Oral  SpO2:    100%  Weight: 49 kg     Height: 5\' 2"  (1.575  m)       GENERAL: The patient is a well-nourished female, in no acute distress. The vital signs are documented above. CARDIAC: There is a regular rate and rhythm.  VASCULAR: No ulcers on fistula PULMONARY: Nonlabored respirations MUSCULOSKELETAL: There are no major deformities or cyanosis. NEUROLOGIC: No focal weakness or paresthesias are detected. SKIN: There are no ulcers or rashes noted. PSYCHIATRIC: The patient has a normal affect.  STUDIES:     ASSESSMENT and PLAN   ESRD: Patient likely has recurrent central venous stenosis.  This will likely require stenting as she is at suboptimal results with venoplasty.  She is currently being worked up for acute stroke as well as anemia.  Her bleeding has been ongoing for at least a month.  Will plan on doing her shuntogram Monday or Tuesday next week depending on whether she remains in the hospital   Gareld June, IV, MD, FACS [facility] [phone] Pager [phone]

## 2023-09-23 NOTE — H&P (Signed)
 History and Physical    Patient: Hannah [name] WGN:[id number] DOB: 09-21-1965 DOA: 09/23/2023 DOS: the patient was seen and examined on 09/23/2023 PCP: Arlys Berke, MD  Patient coming from: Home  Chief Complaint:  Chief Complaint  Patient presents with   Code Stroke       HPI:  58 y.o. F with ESRD on HD, HTN, CAD s/p remote PCI, retinitis on azathioprine, DM, stroke with residual R hemiparesis and dysarthria, chronic pain who presented with worsening of her right hemiparesis and dysarthria after dialysis.  NIHSS 5.    Found to have Hgb 5.7  Stroke team evaluated and suspected recrudescence of symptoms due to anemia, but recommended MRI.  Denied rectal bleeding, but did note on and off problems with fistula bleeding for a while, corroborated by chart.  CMP normal for ESRD.  WBC normal, Hgb 5.7, diff normal, plateelts 107  Glucose normal, INR 1.1, PTT normal. CTH lacunar infarcts old.  ECG NSR.          Review of Systems  Constitutional:  Negative for chills and fever.  Gastrointestinal:  Negative for abdominal pain, blood in stool, diarrhea and melena.  Neurological:  Positive for dizziness, focal weakness and weakness. Negative for seizures and loss of consciousness.  All other systems reviewed and are negative.    Past Medical History:  Diagnosis Date   CAD S/P PCI x 2 2009   MI 2009 -> PCI x 2 ([place]); 01/2016 Nuc ST Nonischemic with Normal EF   Cerebral infarction (HCC) 07/12/2017   2009 first one, has had a total of 4 stokes- last 2021   Chronic kidney disease    Hemo M-W- F   Diabetes type 2, controlled (HCC)    Hemiparesis affecting right side as late effect of cerebrovascular accident (CVA) (HCC)    Hyperlipidemia    Hypertension    Intractable vomiting with nausea 07/12/2017   Multiple sclerosis (HCC) 2009   Myocardial infarction [facility]) 2009   Stroke [facility])    Past Surgical History:  Procedure Laterality Date   A/V FISTULAGRAM Right 02/12/2021    Procedure: A/V FISTULAGRAM;  Surgeon: Young Hensen, MD;  Location: [facility] INVASIVE CV LAB;  Service: Cardiovascular;  Laterality: Right;   A/V FISTULAGRAM Right 05/20/2022   Procedure: A/V Fistulagram;  Surgeon: Young Hensen, MD;  Location: [facility] INVASIVE CV LAB;  Service: Cardiovascular;  Laterality: Right;   A/V FISTULAGRAM N/A 05/05/2023   Procedure: A/V Fistulagram;  Surgeon: Patrick Boor, MD;  Location: [facility] INVASIVE CV LAB;  Service: Cardiovascular;  Laterality: N/A;   A/V SHUNT INTERVENTION N/A 08/16/2023   Procedure: A/V SHUNT INTERVENTION;  Surgeon: Melodie Spry, MD;  Location: [facility] INVASIVE CV LAB;  Service: Cardiovascular;  Laterality: N/A;   APPENDECTOMY     AV FISTULA PLACEMENT Right 10/02/2020   Procedure: RIGHT ARTERIOVENOUS (AV) FISTULA CREATION;  Surgeon: Adine Hoof, MD;  Location: [facility] OR;  Service: Vascular;  Laterality: Right;   AV FISTULA PLACEMENT Right 05/28/2022   Procedure: RIGHT ARM ARTERIOVENOUS (AV) FISTULA REVISION WITH GORETEX GRAFT;  Surgeon: Young Hensen, MD;  Location: [facility] OR;  Service: Vascular;  Laterality: Right;   BASCILIC VEIN TRANSPOSITION Right 11/24/2020   Procedure: SECOND STAGE RIGHT ARM BASCILIC VEIN TRANSPOSITION;  Surgeon: Young Hensen, MD;  Location: [facility] OR;  Service: Vascular;  Laterality: Right;   CYSTOSTOMY W/ BLADDER BIOPSY     INSERTION OF DIALYSIS CATHETER Right 05/28/2022   Procedure: INSERTION OF TUNNELED DIALYSIS CATHETER  IN THE RIGHT INTERNAL JUGULAR;  Surgeon: Young Hensen, MD;  Location: [name] OR;  Service: Vascular;  Laterality: Right;   IR FLUORO GUIDE CV LINE RIGHT  09/29/2020   IR US  GUIDE VASC ACCESS RIGHT  09/29/2020   NM MYOVIEW  LTD  12/25/2015   [place], [place] - NORMAL /NEGATIVE for ischemia. Normal LV Fxn   OVARIAN CYST SURGERY     PERCUTANEOUS CORONARY STENT INTERVENTION (PCI-S)  2009   [place], [place] - 2 stents in settng of MI -- unknown details   PERIPHERAL VASCULAR BALLOON ANGIOPLASTY Right  02/12/2021   Procedure: PERIPHERAL VASCULAR BALLOON ANGIOPLASTY;  Surgeon: Young Hensen, MD;  Location: [facility] INVASIVE CV LAB;  Service: Cardiovascular;  Laterality: Right;  UPPER ARM FISTULA   PERIPHERAL VASCULAR BALLOON ANGIOPLASTY  05/20/2022   Procedure: PERIPHERAL VASCULAR BALLOON ANGIOPLASTY;  Surgeon: Young Hensen, MD;  Location: [facility] INVASIVE CV LAB;  Service: Cardiovascular;;  rt arm fistula site   PERIPHERAL VASCULAR BALLOON ANGIOPLASTY Right 01/28/2022   Procedure: PERIPHERAL VASCULAR BALLOON ANGIOPLASTY;  Surgeon: Young Hensen, MD;  Location: [facility] INVASIVE CV LAB;  Service: Cardiovascular;  Laterality: Right;  Arm fistula   PERIPHERAL VASCULAR BALLOON ANGIOPLASTY  05/05/2023   Procedure: PERIPHERAL VASCULAR BALLOON ANGIOPLASTY;  Surgeon: Patrick Boor, MD;  Location: [facility] INVASIVE CV LAB;  Service: Cardiovascular;;  RUA AVF   TRANSTHORACIC ECHOCARDIOGRAM  07/11/2017   a) 12/2015, [facility] [place]): Moderate Conc LVH w/ sigmoid septum.  No LVOT obstruction.  Hyperdynamic LV fxn - EF 65 to 70%.  GR 1 DD & high LAP/LVEDP.  AoV Sclerosis - No AS;; b) 06/2017: [facility]): Normal LV Fxn - EF 60%.  Abnormal septal motion.  Mild LVH mild MAC.  Negative bubble study (for CVA)   TRANSTHORACIC ECHOCARDIOGRAM  12/2020   a) 05/2020: EF 60-65%. No RWMA. Gr2DD. Sm posterior pericardial effusion. No AS.; b) 09/2020: EF 70-75%.  Hyperdynamic.  No RWMA. ? D Fxn/LAP.  Mildly elevated PAP.  Pericardial effusion.  Mild ao V sclerosis cyst.; c) 12/2020 (Limited): EF 65-70%. No RWMA. Gr1-2 DD. Mild Conc LVH- high LAP. Nl PAP. Circumferential effusion w/o tamponade. AoV Sclerosis - no AS. Nl RAP.   ULTRASOUND GUIDANCE FOR VASCULAR ACCESS Right 05/28/2022   Procedure: ULTRASOUND GUIDANCE FOR VASCULAR ACCESS;  Surgeon: Young Hensen, MD;  Location: [facility] OR;  Service: Vascular;  Laterality: Right;   VENOUS ANGIOPLASTY Right 08/16/2023   Procedure: VENOUS ANGIOPLASTY;  Surgeon: Melodie Spry, MD;  Location: [facility] INVASIVE CV LAB;  Service: Cardiovascular;  Laterality: Right;  70% Innominate; 70% Outflow Basilic   Social History:  reports that she has never smoked. She has never used smokeless tobacco. She reports that she does not currently use alcohol. She reports that she does not use drugs.  Allergies  Allergen Reactions   Ibuprofen Swelling    Swelling to hands, feet and face    Influenza Vaccines Anaphylaxis and Swelling    fever   Pneumococcal Vaccine Anaphylaxis and Swelling    fever   Promethazine Other (See Comments)    hallucinations   Ambien  [Zolpidem ] Other (See Comments)    Unknown reaction   Codeine Itching   Cyclobenzaprine Other (See Comments)    hallucinations    Hydrocodone -Acetaminophen  Itching   Tape Itching   Tramadol  Hcl Other (See Comments)    hallucinations   Wound Dressing Adhesive Itching    Use paper tape   Oxycodone  Nausea And Vomiting    History reviewed. No pertinent  family history.  Prior to Admission medications   Medication Sig Start Date End Date Taking? Authorizing Provider  acetaminophen  (TYLENOL ) 500 MG tablet Take 500 mg by mouth every 6 (six) hours as needed for mild pain (pain score 1-3), moderate pain (pain score 4-6), fever or headache.   Yes [provider]  amLODipine  (NORVASC ) 10 MG tablet Take 1 tablet (10 mg total) by mouth daily. Except on dialysis days do not take a dose. 06/08/22  Yes Eilleen Grates, MD  azaTHIOprine (IMURAN) 50 MG tablet Take 50 mg by mouth daily. 04/26/23 04/25/24 Yes [provider]  calcium  acetate (PHOSLO ) 667 MG capsule Take 667 mg by mouth in the morning, at noon, and at bedtime. 11/11/20  Yes [provider]  carvedilol  (COREG ) 25 MG tablet Take 1 tablet (25 mg total) by mouth 2 (two) times daily with a meal. And as directed. 06/08/22  Yes Eilleen Grates, MD  clopidogrel  (PLAVIX ) 75 MG tablet Take 1 tablet (75 mg total) by mouth daily. 11/03/20  Yes Love, Renay Carota, PA-C  diclofenac  Sodium (VOLTAREN ) 1 % GEL Apply 2 g topically 4 (four) times daily. To leg Patient taking differently: Apply 2 g topically 4 (four) times daily as needed (pain). To leg 11/03/20  Yes Love, Renay Carota, PA-C  famotidine  (PEPCID ) 20 MG tablet Take 1 tablet (20 mg total) by mouth daily. 11/03/20  Yes Love, Renay Carota, PA-C  insulin  degludec (TRESIBA  FLEXTOUCH) 100 UNIT/ML FlexTouch Pen Inject 12 Units into the skin at bedtime. Patient taking differently: Inject 8 Units into the skin at bedtime. 11/03/20  Yes Love, Renay Carota, PA-C  insulin  lispro (HUMALOG ) 100 UNIT/ML KwikPen Inject 5 Units into the skin 3 (three) times daily. Patient taking differently: Inject 4-6 Units into the skin 3 (three) times daily. 11/04/20  Yes Love, Renay Carota, PA-C  metoCLOPramide  (REGLAN ) 5 MG tablet Take 5 mg by mouth every 6 (six) hours as needed for nausea or vomiting. 09/16/21  Yes [provider]  nitroGLYCERIN  (NITROSTAT ) 0.4 MG SL tablet Place 1 tablet (0.4 mg total) under the tongue every 5 (five) minutes as needed for chest pain. 06/25/20  Yes Angiulli, Everlyn Hockey, PA-C  ondansetron  (ZOFRAN ) 4 MG tablet Take 4 mg by mouth every 8 (eight) hours as needed for nausea or vomiting. 01/07/23  Yes [provider]  polyethylene glycol (MIRALAX  / GLYCOLAX ) 17 g packet Take 17 g by mouth daily. Patient taking differently: Take 17 g by mouth daily as needed for moderate constipation. 11/04/20  Yes Love, Renay Carota, PA-C  rosuvastatin  (CRESTOR ) 20 MG tablet Take 20 mg by mouth daily. 02/05/21  Yes [provider]  senna-docusate (SENOKOT-S) 8.6-50 MG tablet Take 1 tablet by mouth 2 (two) times daily as needed for moderate constipation. Patient taking differently: Take 1 tablet by mouth 2 (two) times daily as needed for mild constipation or moderate constipation. 11/03/20  Yes Love, Renay Carota, PA-C  Continuous Blood Gluc Sensor (FREESTYLE LIBRE 2 SENSOR) MISC USE AS DIRECTED CHANGE EVERY 14 DAYS 12/30/20    [provider]  lidocaine -prilocaine  (EMLA ) cream Apply 1 Application topically daily as needed (port access). 03/02/22   [provider]    Physical Exam: Vitals:   09/23/23 1519 09/23/23 1536 09/23/23 1545 09/23/23 1600  BP: (!) 155/61 (!) 158/62 (!) 147/65 (!) 162/67  Pulse: 94 97    Resp: 16 16    Temp: 98.2 F (36.8 C) 99 F (37.2 C)    TempSrc: Oral Oral  SpO2: 100% 100%    Weight:      Height:       General appearance: Elderly adult female, lying in bed, interactive and appropriate, responds to questions, oriented HEENT: anicteric, conjunctiva pink, lids and lashes normal.  No nasal deformity, discharge, or epistaxis, oropharynx moist, no oral lesions.   Cor:  RRR, without murmurs, rubs.  JVP normal, no LE edema Resp:  Normal respiratory rate and rhythm.  CTAB without rales or wheezes. Abd:  No TTP or rebound all quadrants.  No masses or organomegaly.   No ascites, distension.   Neuro: Speech is dysarthric at baseline.  She has mild right-sided spasticity, generalized weakness is present, I do not appreciate a difference tween the left and the right side.  She is oriented to person, place, time, and situation.  Memory and short-term recall seem normal.   Psych:  Attention span and concentration are within normal limits.  Affect normal.  appropriate thought content and normal rate of speech, thought process linear           Data Reviewed: Case discussed with nephrology and vascular surgery Basic metabolic panel shows creatinine 2.5, potassium 3.4 Troponin low and flat, no evidence of ischemia CBC shows hemoglobin 5.7, platelets 107 INR 1.1 PTT normal CT head normal EKG, personally reviewed, shows no ST changes.   Assessment and Plan: * Anemia Unclear cause.  Baseline appeared to be ~mid 8s (although last Hgb last month was ~11)  Patient and family report no melena.  Doubt GI bleed.  Discussed with vascular surgery, seems unlikely to develop that  degree of anemia from the small amounts of bleeding that could happen after graft access for HD (30-50 cc at a time).  No macrocytosis to suggest azathioprine cause.  It may just be this is nutritional deficiency on top of anemia of ESRD. - Check retics, iron stores - Check B12 and folate - Check LDH, hapto - Smear - Defer Aranesp  to Nephrology    Stroke-like symptoms This appears to be recrudescence of old stroke symptoms due to anemia. Neurology evaluated, feel new acute stroke is unlikely, recommend MRI brain - Obtain MRI brain - if normal, no other work up  - Continue Plavix  and Crestor   Chorioretinitis - Continue azathioprine  Hemiparesis affecting right side as late effect of cerebrovascular accident (CVA) (HCC)    CAD S/P PCI x 2 - Continue Plavix , Crestor , carvedilol   ESRD on hemodialysis (HCC) - Consult nephrology for HD - Aranesp  per nephrology - Notification to nephrology sent  Essential hypertension Blood pressure elevated - Continue amlodipine , carvedilol   Type 2 diabetes mellitus (HCC) - Continue Tresiba  - Sliding scale corrections  Hyperlipidemia associated with type 2 diabetes mellitus (HCC) - Continue Crestor          Advance Care Planning: FULL, confirmed with family  Consults: Nephrology Vascular surgery  Family Communication: Daughter at bedside  Severity of Illness: The appropriate patient status for this patient is OBSERVATION. Observation status is judged to be reasonable and necessary in order to provide the required intensity of service to ensure the patient's safety. The patient's presenting symptoms, physical exam findings, and initial radiographic and laboratory data in the context of their medical condition is felt to place them at decreased risk for further clinical deterioration. Furthermore, it is anticipated that the patient will be medically stable for discharge from the hospital within 2 midnights of admission.    Author: Ephriam Hashimoto, MD 09/23/2023 4:24 PM  For on call review  [email or web address]

## 2023-09-23 NOTE — Assessment & Plan Note (Addendum)
 Unclear cause.  Baseline appeared to be ~mid 8s (although last Hgb last month was ~11)  Patient and family report no melena.  Doubt GI bleed.  Discussed with vascular surgery, seems unlikely to develop that degree of anemia from the small amounts of bleeding that could happen after graft access for HD (30-50 cc at a time).  No macrocytosis to suggest azathioprine cause. - Check retics, iron stores - Check B12 and folate - Check LDH, hapto - Smear - Defer Aranesp  to Nephrology

## 2023-09-23 NOTE — ED Notes (Signed)
 Patient transported to MRI

## 2023-09-23 NOTE — ED Triage Notes (Signed)
 Pt BIBA from dialysis w/ s/s of a stroke. LTW 1017 After dialysis pt had R sided facial droop, R sided weakness, and dysarthria. 911 was activated. Pt has a hx of a stroke in 2021 w/ residual symptoms of R sided weakness and baseline dysarthria. Pt is A&O x4 and communicating appropriately. GCS 15.

## 2023-09-23 NOTE — Assessment & Plan Note (Addendum)
 This appears to be recrudescence of old stroke symptoms due to anemia. Neurology evaluated, feel new acute stroke is unlikely, recommend MRI brain - Obtain MRI brain - if normal, no other work up  - Continue Plavix  and Crestor 

## 2023-09-23 NOTE — Consult Note (Signed)
 NEUROLOGY CONSULT NOTE   Date of service: Sep 23, 2023 Patient Name: Hannah [name] MRN:  [id number] DOB:  November 21, 1965 Chief Complaint: "CODE STROKE" Requesting Provider: Albertus Hughs, DO  History of Present Illness  Hannah [name] is a 58 y.o. female with hx of type 2 diabetes mellitus, dyslipidemia, hypertension,  multiple sclerosis, history of coronary artery disease status post PCI, history of CVA with hemiparesis affecting the right side, history of coronary artery disease status post PCI, end-stage renal disease on hemodialysis who was BIB EMS from dialysis due to aphasia, right facial droop, right-sided weakness. En route, BP 160/73 CBG 144. On exam at bridge, patient is alert, oriented to name, year,age or month. Naming intact for several items. She is expressively aphasic with slow responses. Decreased visual acuity at baseline. Right-sided hemiparesis is chronic from her previous stroke.  Patient did receive her complete dilaysis treatment, they did not take any fluids off.  CTH  negative.   LKW: 1017 at dialysis Modified rankin score: 3-Moderate disability-requires help but walks WITHOUT assistance IV Thrombolysis: Np, symptoms are baseline EVT: No, no LVO suspected  NIHSS components Score: Comment  1a Level of Conscious 0[x]  1[]  2[]  3[]      1b LOC Questions 0[]  1[]  2[]       1c LOC Commands 0[x]  1[]  2[]       2 Best Gaze 0[x]  1[]  2[]       3 Visual 0[x]  1[]  2[]  3[]      4 Facial Palsy 0[]  1[x]  2[]  3[]      5a Motor Arm - left 0[]  1[x]  2[]  3[]  4[]  UN[]    5b Motor Arm - Right 0[]  1[]  2[x]  3[]  4[]  UN[]    6a Motor Leg - Left 0[]  1[]  2[x]  3[]  4[]  UN[]    6b Motor Leg - Right 0[]  1[]  2[x]  3[]  4[]  UN[]    7 Limb Ataxia 0[]  1[x]  2[]  UN[]      8 Sensory 0[]  1[]  2[]  UN[]      9 Best Language 0[]  1[]  2[x]  3[]      10 Dysarthria 0[]  1[x]  2[]  UN[]      11 Extinct. and Inattention 0[]  1[]  2[]       TOTAL:   12    Patient was seen 05/05/2022 with concern for worsening of her baseline right-sided  weakness. At this time, R facial droop and slow responses were also noted--symptoms described as very similar to her presentation today. NIH: 10. CT/CA/MRI negative for acute abnormality.    ROS  Comprehensive ROS performed and pertinent positives documented in HPI   Past History   Past Medical History:  Diagnosis Date   CAD S/P PCI x 2 2009   MI 2009 -> PCI x 2 ([place]); 01/2016 Nuc ST Nonischemic with Normal EF   Cerebral infarction (HCC) 07/12/2017   2009 first one, has had a total of 4 stokes- last 2021   Chronic kidney disease    Hemo M-W- F   Diabetes type 2, controlled (HCC)    Hemiparesis affecting right side as late effect of cerebrovascular accident (CVA) (HCC)    Hyperlipidemia    Hypertension    Intractable vomiting with nausea 07/12/2017   Multiple sclerosis (HCC) 2009   Myocardial infarction [facility]) 2009   Stroke [facility])     Past Surgical History:  Procedure Laterality Date   A/V FISTULAGRAM Right 02/12/2021   Procedure: A/V FISTULAGRAM;  Surgeon: Young Hensen, MD;  Location: [facility] INVASIVE CV LAB;  Service: Cardiovascular;  Laterality: Right;   A/V FISTULAGRAM Right 05/20/2022   Procedure:  A/V Fistulagram;  Surgeon: Young Hensen, MD;  Location: [facility] INVASIVE CV LAB;  Service: Cardiovascular;  Laterality: Right;   A/V FISTULAGRAM N/A 05/05/2023   Procedure: A/V Fistulagram;  Surgeon: Patrick Boor, MD;  Location: [facility] INVASIVE CV LAB;  Service: Cardiovascular;  Laterality: N/A;   A/V SHUNT INTERVENTION N/A 08/16/2023   Procedure: A/V SHUNT INTERVENTION;  Surgeon: Melodie Spry, MD;  Location: [facility] INVASIVE CV LAB;  Service: Cardiovascular;  Laterality: N/A;   APPENDECTOMY     AV FISTULA PLACEMENT Right 10/02/2020   Procedure: RIGHT ARTERIOVENOUS (AV) FISTULA CREATION;  Surgeon: Adine Hoof, MD;  Location: [facility] OR;  Service: Vascular;  Laterality: Right;   AV FISTULA PLACEMENT Right 05/28/2022   Procedure: RIGHT ARM ARTERIOVENOUS (AV) FISTULA REVISION  WITH GORETEX GRAFT;  Surgeon: Young Hensen, MD;  Location: [facility] OR;  Service: Vascular;  Laterality: Right;   BASCILIC VEIN TRANSPOSITION Right 11/24/2020   Procedure: SECOND STAGE RIGHT ARM BASCILIC VEIN TRANSPOSITION;  Surgeon: Young Hensen, MD;  Location: [facility] OR;  Service: Vascular;  Laterality: Right;   CYSTOSTOMY W/ BLADDER BIOPSY     INSERTION OF DIALYSIS CATHETER Right 05/28/2022   Procedure: INSERTION OF TUNNELED DIALYSIS CATHETER IN THE RIGHT INTERNAL JUGULAR;  Surgeon: Young Hensen, MD;  Location: [facility] OR;  Service: Vascular;  Laterality: Right;   IR FLUORO GUIDE CV LINE RIGHT  09/29/2020   IR US  GUIDE VASC ACCESS RIGHT  09/29/2020   NM MYOVIEW  LTD  12/25/2015   [place], [place] - NORMAL /NEGATIVE for ischemia. Normal LV Fxn   OVARIAN CYST SURGERY     PERCUTANEOUS CORONARY STENT INTERVENTION (PCI-S)  2009   [place], [place] - 2 stents in settng of MI -- unknown details   PERIPHERAL VASCULAR BALLOON ANGIOPLASTY Right 02/12/2021   Procedure: PERIPHERAL VASCULAR BALLOON ANGIOPLASTY;  Surgeon: Young Hensen, MD;  Location: [facility] INVAS[facility] CV LAB;  Service: Cardiovascular;  Laterality: Right;  UPPER ARM FISTULA   PERIPHERAL VASCULAR BALLOON ANGIOPLASTY  05/20/2022   Procedure: PERIPHERAL VASCULAR BALLOON ANGIOPLASTY;  Surgeon: Young Hensen, MD;  Location: [facility] INVASIVE CV LAB;  Service: Cardiovascular;;  rt arm fistula site   PERIPHERAL VASCULAR BALLOON ANGIOPLASTY Right 01/28/2022   Procedure: PERIPHERAL VASCULAR BALLOON ANGIOPLASTY;  Surgeon: Young Hensen, MD;  [facility]: [facility] CV LAB;  Service: Cardiovascular;  Laterality: Right;  Arm fistula   PERIPHERAL VASCULAR BALLOON ANGIOPLASTY  05/05/2023   Procedure: PERIPHERAL VASCULAR BALLOON ANGIOPLASTY;  Surgeon: Patrick Boor, MD;  Location: [facility] INVASIVE CV LAB;  Service: Cardiovascular;;  RUA AVF   TRANSTHORACIC ECHOCARDIOGRAM  07/11/2017   a) 12/2015, [facility], ): Moderate Conc LVH w/ sigmoid septum.   No LVOT obstruction.  Hyperdynamic LV fxn - EF 65 to 70%.  GR 1 DD & high LAP/LVEDP.  AoV Sclerosis - No AS;; b) 06/2017: [facility]): Normal LV Fxn - EF 60%.  Abnormal septal motion.  Mild LVH mild MAC.  Negative bubble study (for CVA)   TRANSTHORACIC ECHOCARDIOGRAM  12/2020   a) 05/2020: EF 60-65%. No RWMA. Gr2DD. Sm posterior pericardial effusion. No AS.; b) 09/2020: EF 70-75%.  Hyperdynamic.  No RWMA. ? D Fxn/LAP.  Mildly elevated PAP.  Pericardial effusion.  Mild ao V sclerosis cyst.; c) 12/2020 (Limited): EF 65-70%. No RWMA. Gr1-2 DD. Mild Conc LVH- high LAP. Nl PAP. Circumferential effusion w/o tamponade. AoV Sclerosis - no AS. Nl RAP.   ULTRASOUND GUIDANCE FOR VASCULAR ACCESS Right 05/28/2022   Procedure: ULTRASOUND GUIDANCE FOR VASCULAR  ACCESS;  Surgeon: Young Hensen, MD;  Location[facility] OR;  Service: Vascular;  Laterality: Right;   VENOUS ANGIOPLASTY Right 08/16/2023   Procedure: VENOUS ANGIOPLASTY;  Surgeon: Melodie Spry, MD;  Location[facility] INVASIVE CV LAB;  Service: Cardiovascular;  Laterality: Right;  70% Innominate; 70% Outflow Basilic    Family History: No family history on file.  Social History  reports that she has never smoked. She has never used smokeless tobacco. She reports that she does not currently use alcohol. She reports that she does not use drugs.  Allergies  Allergen Reactions   Ibuprofen Swelling    Swelling to hands, feet and face    Influenza Vaccines Anaphylaxis and Swelling    fever   Pneumococcal Vaccine Anaphylaxis and Swelling    fever   Promethazine Other (See Comments)    hallucinations   Ambien  [Zolpidem ] Other (See Comments)    Unknown reaction   Codeine Itching   Cyclobenzaprine Other (See Comments)    hallucinations    Hydrocodone -Acetaminophen  Itching   Tape Itching   Tramadol  Hcl Other (See Comments)    hallucinations   Wound Dressing Adhesive Itching    Use paper tape   Oxycodone  Nausea And Vomiting     Medications   Current Facility-Administered Medications:    sodium chloride  flush (NS) 0.9 % injection 3 mL, 3 mL, Intravenous, Once, Albertus Hughs, DO  Current Outpatient Medications:    acetaminophen  (TYLENOL ) 325 MG tablet, Take 650 mg by mouth every 6 (six) hours as needed for moderate pain., Disp: , Rfl:    amLODipine  (NORVASC ) 10 MG tablet, Take 1 tablet (10 mg total) by mouth daily. Except on dialysis days do not take a dose., Disp: 30 tablet, Rfl: 0   azaTHIOprine (IMURAN) 50 MG tablet, Take 50 mg by mouth daily., Disp: , Rfl:    calcium  acetate (PHOSLO ) 667 MG capsule, Take 667 mg by mouth in the morning, at noon, and at bedtime., Disp: , Rfl:    carvedilol  (COREG ) 25 MG tablet, Take 1 tablet (25 mg total) by mouth 2 (two) times daily with a meal. And as directed., Disp: 60 tablet, Rfl: 0   clopidogrel  (PLAVIX ) 75 MG tablet, Take 1 tablet (75 mg total) by mouth daily., Disp: 30 tablet, Rfl: 0   Continuous Blood Gluc Sensor (FREESTYLE LIBRE 2 SENSOR) MISC, USE AS DIRECTED CHANGE EVERY 14 DAYS, Disp: , Rfl:    diclofenac  Sodium (VOLTAREN ) 1 % GEL, Apply 2 g topically 4 (four) times daily. To leg (Patient taking differently: Apply 2 g topically 4 (four) times daily as needed (pain). To leg), Disp: 350 g, Rfl: 0   ethyl chloride spray, Apply 1 Application topically as needed., Disp: , Rfl:    famotidine  (PEPCID ) 20 MG tablet, Take 1 tablet (20 mg total) by mouth daily., Disp: 30 tablet, Rfl: 0   insulin  degludec (TRESIBA  FLEXTOUCH) 100 UNIT/ML FlexTouch Pen, Inject 12 Units into the skin at bedtime. (Patient taking differently: Inject 8 Units into the skin at bedtime. Sliding scale), Disp: 15 mL, Rfl: 0   insulin  lispro (HUMALOG ) 100 UNIT/ML KwikPen, Inject 5 Units into the skin 3 (three) times daily. (Patient taking differently: Inject 6 Units into the skin 3 (three) times daily.), Disp: 15 mL, Rfl: 0   lidocaine -prilocaine  (EMLA ) cream, Apply 1 Application topically daily as needed (port  access)., Disp: , Rfl:    metoCLOPramide  (REGLAN ) 5 MG tablet, Take 5 mg by mouth 3 (three) times daily before meals. 30 min  before meals, Disp: , Rfl:    nitroGLYCERIN  (NITROSTAT ) 0.4 MG SL tablet, Place 1 tablet (0.4 mg total) under the tongue every 5 (five) minutes as needed for chest pain., Disp: 30 tablet, Rfl: 12   ondansetron  (ZOFRAN ) 4 MG tablet, Take 4 mg by mouth 2 (two) times daily., Disp: , Rfl:    polyethylene glycol (MIRALAX  / GLYCOLAX ) 17 g packet, Take 17 g by mouth daily. (Patient taking differently: Take 17 g by mouth daily as needed for moderate constipation.), Disp: 30 each, Rfl: 0   rosuvastatin  (CRESTOR ) 20 MG tablet, Take 20 mg by mouth daily., Disp: , Rfl:    senna-docusate (SENOKOT-S) 8.6-50 MG tablet, Take 1 tablet by mouth 2 (two) times daily as needed for moderate constipation. (Patient taking differently: Take 1 tablet by mouth 2 (two) times daily as needed for mild constipation or moderate constipation.), Disp: 60 tablet, Rfl: 0  Vitals   Vitals:   09/23/23 1100  Height: 5\' 2"  (1.575 m)    Body mass index is 18.87 kg/m.  Physical Exam   Constitutional: Appears well-developed and well-nourished.  Psych: Affect appropriate to situation.  Head: Normocephalic. Atraumatic.  Cardiovascular: Normal rate and regular rhythm.  Respiratory: Effort normal, non-labored breathing.   Neurologic Examination   Neuro: Mental Status: Patient is awake, alert, oriented to self, year, age, month.  Speech/Language: No signs of dysarthria. She has expressive aphasia with slowed responses, but is able to name several objects and repeat. She follows all commands.  Cranial Nerves: II: Pupils are equal, round, and reactive to light.  Baseline visual field deficit due to ** III,IV, VI: EOMI without ptosis. No gaze deviation or nystagmus.  V: Facial sensation is symmetric to light touch VII: Facial movement is symmetric.  VIII: hearing is intact to voice X: Uvula elevates  symmetrically XI: Shoulder shrug is symmetric. XII: tongue is midline   Motor: Bulk is normal. Increased tone RUE.   Sensory: Sensation is symmetric to light touch in the arms and legs. Cerebellar: FNF reduced with ataxia on RUE. Slowed fine motor movements   Labs/Imaging/Neurodiagnostic studies   CBC: No results for input(s): "WBC", "NEUTROABS", "HGB", "HCT", "MCV", "PLT" in the last 168 hours. Basic Metabolic Panel:  Lab Results  Component Value Date   NA 134 (L) 09/04/2023   K 5.5 (H) 09/04/2023   CO2 28 09/04/2023   GLUCOSE 321 (H) 09/04/2023   BUN 23 (H) 09/04/2023   CREATININE 5.61 (H) 09/04/2023   CALCIUM  9.5 09/04/2023   GFRNONAA 8 (L) 09/04/2023   Lipid Panel:  Lab Results  Component Value Date   LDLCALC 41 04/01/2022   HgbA1c:  Lab Results  Component Value Date   HGBA1C 7.5 (H) 04/01/2022   Urine Drug Screen:     Component Value Date/Time   LABOPIA NONE DETECTED 01/12/2021 2100   COCAINSCRNUR NONE DETECTED 01/12/2021 2100   LABBENZ NONE DETECTED 01/12/2021 2100   AMPHETMU NONE DETECTED 01/12/2021 2100   THCU NONE DETECTED 01/12/2021 2100   LABBARB NONE DETECTED 01/12/2021 2100    Alcohol Level     Component Value Date/Time   ETH <10 05/05/2022 1243   INR  Lab Results  Component Value Date   INR 1.0 05/05/2022   APTT  Lab Results  Component Value Date   APTT 29 05/05/2022   AED levels: No results found for: "PHENYTOIN", "ZONISAMIDE", "LAMOTRIGINE", "LEVETIRACETA"  CT Head without contrast(Personally reviewed): 1. No evidence of an acute intracranial abnormality. 2. Lacunar infarct within the right  corona radiata, new from the prior brain MRI of 05/05/2022 but chronic in appearance. 3. Unchanged chronic lacunar infarcts within/about the bilateral deep gray nuclei. 4. Background age-advanced cerebral white matter disease. This likely at least partly reflects chronic small vessel ischemia. However, a history of potential multiple sclerosis  was noted on prior radiology examinations and a component of demyelinating disease is possible. 5. Mild generalized cerebral atrophy.  MRI Brain(Personally reviewed): PENDING  ASSESSMENT   Hannah [name] is a 58 y.o. female with hx of type 2 diabetes mellitus, dyslipidemia, hypertension, MS, ESRD on HD who was BIB EMS from dialysis due to aphasia, right facial droop, right-sided weakness.   On exam at bridge, patient is alert, oriented to name, year,age or month. Naming intact for several items. She is expressively aphasic with slow responses. Decreased visual acuity at baseline. Right-sided hemiparesis is chronic from her previous stroke.  Patient did receive her complete dilaysis treatment, they did not take any fluids off.  CTH  negative.   Symptoms in line with patient's baseline deficits. Suspicion for new stroke is low, but recommend getting MRI to rule out. Hgb 5.7.   Recrudescence of previous stroke symptoms likely due to anemia, hypotension  worsened during dialysis  RECOMMENDATIONS   - routine MRI   If positive, admit for full stroke workup - metabolic workup per primary - anemia treatment per primary ______________________________________________________________________    Signed, Audrene Lease, NP [facility]   NEUROHOSPITALIST ADDENDUM Performed a face to face diagnostic evaluation.   I have reviewed the contents of history and physical exam as documented by PA/ARNP/Resident and agree with above documentation.  I have discussed and formulated the above plan as documented. Edits to the note have been made as needed.  Impression/Key exam findings/Plan: I saw her back in 2023 with similar presentation. She has baseline R sided weakness and reviewing my prior exam and comparing it to her current presentation, I do not appreciate any significant worsening. Given their is very little change in her neuro exam today compared to prior, low suspicion for new acute  stroke and given exam is similar to prior, too mild to treat with [place] or thrombectomy.  Will get MRI brain to definitively rule out a small stroke.  Labs notable for Anemia with Hb of 5.7. I suspect that increases her risk for recrudescence too. No further neuro workup if MRI Brain is non revealing.  Charliee Krenz, MD [facility] [phone]   If 7pm to 7am, please call on call as listed on AMION.

## 2023-09-24 DIAGNOSIS — D62 Acute posthemorrhagic anemia: Secondary | ICD-10-CM | POA: Diagnosis not present

## 2023-09-24 LAB — HIV ANTIBODY (ROUTINE TESTING W REFLEX): HIV Screen 4th Generation wRfx: NONREACTIVE

## 2023-09-24 LAB — TYPE AND SCREEN
ABO/RH(D): A POS
Antibody Screen: NEGATIVE
Donor AG Type: NEGATIVE
Donor AG Type: NEGATIVE
PT AG Type: NEGATIVE
Unit division: 0
Unit division: 0

## 2023-09-24 LAB — TECHNOLOGIST SMEAR REVIEW: Plt Morphology: NORMAL

## 2023-09-24 LAB — BPAM RBC
Blood Product Expiration Date: 202505282359
Blood Product Expiration Date: 202505282359
ISSUE DATE / TIME: 202505091506
ISSUE DATE / TIME: 202505091959
Unit Type and Rh: 6200
Unit Type and Rh: 6200

## 2023-09-24 LAB — DIC (DISSEMINATED INTRAVASCULAR COAGULATION)PANEL
D-Dimer, Quant: 0.28 ug{FEU}/mL (ref 0.00–0.50)
Fibrinogen: 214 mg/dL (ref 210–475)
INR: 1.1 (ref 0.8–1.2)
Platelets: 95 10*3/uL — ABNORMAL LOW (ref 150–400)
Prothrombin Time: 14.3 s (ref 11.4–15.2)
Smear Review: NONE SEEN
aPTT: 30 s (ref 24–36)

## 2023-09-24 LAB — BASIC METABOLIC PANEL WITH GFR
Anion gap: 10 (ref 5–15)
BUN: 21 mg/dL — ABNORMAL HIGH (ref 6–20)
CO2: 29 mmol/L (ref 22–32)
Calcium: 8.5 mg/dL — ABNORMAL LOW (ref 8.9–10.3)
Chloride: 98 mmol/L (ref 98–111)
Creatinine, Ser: 3.62 mg/dL — ABNORMAL HIGH (ref 0.44–1.00)
GFR, Estimated: 14 mL/min — ABNORMAL LOW (ref >60)
Glucose, Bld: 92 mg/dL (ref 70–99)
Potassium: 3.6 mmol/L (ref 3.5–5.1)
Sodium: 137 mmol/L (ref 135–145)

## 2023-09-24 LAB — IRON AND TIBC
Iron: 89 ug/dL (ref 28–170)
Saturation Ratios: 54 % — ABNORMAL HIGH (ref 10.4–31.8)
TIBC: 164 ug/dL — ABNORMAL LOW (ref 250–450)
UIBC: 75 ug/dL

## 2023-09-24 LAB — CBC
HCT: 23.7 % — ABNORMAL LOW (ref 36.0–46.0)
Hemoglobin: 8 g/dL — ABNORMAL LOW (ref 12.0–15.0)
MCH: 28.9 pg (ref 26.0–34.0)
MCHC: 33.8 g/dL (ref 30.0–36.0)
MCV: 85.6 fL (ref 80.0–100.0)
Platelets: 106 10*3/uL — ABNORMAL LOW (ref 150–400)
RBC: 2.77 MIL/uL — ABNORMAL LOW (ref 3.87–5.11)
RDW: 18.9 % — ABNORMAL HIGH (ref 11.5–15.5)
WBC: 5.1 10*3/uL (ref 4.0–10.5)
nRBC: 0.4 % — ABNORMAL HIGH (ref 0.0–0.2)

## 2023-09-24 LAB — GLUCOSE, CAPILLARY
Glucose-Capillary: 90 mg/dL (ref 70–99)
Glucose-Capillary: 160 mg/dL — ABNORMAL HIGH (ref 70–99)
Glucose-Capillary: 229 mg/dL — ABNORMAL HIGH (ref 70–99)
Glucose-Capillary: 234 mg/dL — ABNORMAL HIGH (ref 70–99)

## 2023-09-24 LAB — VITAMIN B12: Vitamin B-12: 362 pg/mL (ref 180–914)

## 2023-09-24 LAB — RETICULOCYTES
Immature Retic Fract: 35.4 % — ABNORMAL HIGH (ref 2.3–15.9)
RBC.: 2.7 MIL/uL — ABNORMAL LOW (ref 3.87–5.11)
Retic Count, Absolute: 89.1 10*3/uL (ref 19.0–186.0)
Retic Ct Pct: 3.3 % — ABNORMAL HIGH (ref 0.4–3.1)

## 2023-09-24 LAB — FERRITIN: Ferritin: 909 ng/mL — ABNORMAL HIGH (ref 11–307)

## 2023-09-24 LAB — LACTATE DEHYDROGENASE: LDH: 167 U/L (ref 98–192)

## 2023-09-24 LAB — FOLATE: Folate: 7.1 ng/mL (ref >5.9)

## 2023-09-24 MED ORDER — PANTOPRAZOLE SODIUM 40 MG PO TBEC
40.0000 mg | DELAYED_RELEASE_TABLET | Freq: Every day | ORAL | Status: DC
  Administered 2023-09-24 – 2023-09-27 (×3): 40 mg via ORAL
  Filled 2023-09-24 (×4): qty 1

## 2023-09-24 MED ORDER — HYDRALAZINE HCL 20 MG/ML IJ SOLN
10.0000 mg | Freq: Three times a day (TID) | INTRAMUSCULAR | Status: DC | PRN
  Administered 2023-09-24: 10 mg via INTRAVENOUS
  Filled 2023-09-24: qty 1

## 2023-09-24 NOTE — Progress Notes (Signed)
 PROGRESS NOTE                                                                                                                                                                                                             Patient Demographics:    Hannah [name], is a 58 y.o. female, DOB - [birth year], ZOX:[id number]  Outpatient Primary MD for the patient is Arlys Berke, MD    LOS - 0  Admit date - 09/23/2023    Chief Complaint  Patient presents with   Code Stroke       Brief Narrative (HPI from H&P)   58 y.o. F with ESRD on HD, HTN, CAD s/p remote PCI, retinitis on azathioprine, DM, stroke with residual R hemiparesis and dysarthria, chronic pain who presented with worsening of her right hemiparesis and dysarthria after dialysis.  NIHSS 5.  In the ER workup was negative for stroke eventually CT and MRI nonacute, her blood work in the ER suggested her hemoglobin was 5.7, patient apparently has been having a lot of issues with her right arm fistula bleed after every dialysis session related to her central stenosis.  She was admitted for further care.    Subjective:    Hannah [name] today has, No headache, No chest pain, No abdominal pain - No Nausea, No new weakness tingling or numbness, no SOB   Assessment  & Plan :   Symptomatic anemia due to intermittent right upper extremity AV fistula site bleeding which has been subacute to chronic. She has no history suggestive of GI bleed, DIC panel negative, has received 2 units of packed RBC transfusion on 09/23/2023, posttransfusion clinically back to baseline, recheck CBC, no signs of ongoing bleeding.  Nephrology on board along with vascular surgery, vascular surgery contemplating right arm shuntogram with a possible shunt placement to relieve central stenosis on 09/26/2023.  Continue to monitor CBC.  Stroke-like symptoms This appears to be recrudescence of old stroke symptoms due to anemia.  Neurology evaluated, feel new acute stroke is unlikely, MRI negative, continue Plavix  and Crestor  along with supportive care.  Close to her baseline now has chronic right-sided hemiparesis from previous stroke.  Chorioretinitis - Continue azathioprine  CAD S/P PCI x 2  - Continue Plavix , Crestor , carvedilol   ESRD on hemodialysis [facility])  - Consult nephrology MWF schedule.  Essential hypertension Blood  pressure elevated, continue amlodipine , carvedilol , as needed hydralazine  added  Hyperlipidemia associated with type 2 diabetes mellitus (HCC)  - Continue Crestor   Type 2 diabetes mellitus (HCC)  - Continue Tresiba , - Sliding scale corrections  Lab Results  Component Value Date   HGBA1C 7.5 (H) 04/01/2022   CBG (last 3)  Recent Labs    09/23/23 1126 09/23/23 2100 09/24/23 0743  GLUCAP 117* 139* 90         Condition - Extremely Guarded  Family Communication  :  daughter updated on 09/24/23  Code Status :  Full  Consults  :  VVS, renal  PUD Prophylaxis : PPI   Procedures  :      MRI - 1. Motion degraded exam. 2. No acute intracranial abnormality. 3. Moderate to advanced chronic microvascular ischemic disease with a few scattered remote lacunar infarcts as above.      Disposition Plan  :    Status is: Observation   DVT Prophylaxis  :    SCDs Start: 09/23/23 1938    Lab Results  Component Value Date   PLT 95 (L) 09/24/2023    Diet :  Diet Order             Diet renal/carb modified with fluid restriction Diet-HS Snack? Nothing; Fluid restriction: 1200 mL Fluid; Room service appropriate? Yes; Fluid consistency: Thin  Diet effective now                    Inpatient Medications  Scheduled Meds:  amLODipine   10 mg Oral Daily   azaTHIOprine  50 mg Oral Daily   carvedilol   25 mg Oral BID WC   clopidogrel   75 mg Oral Daily   insulin  aspart  0-5 Units Subcutaneous QHS   insulin  aspart  0-6 Units Subcutaneous TID WC   insulin  glargine-yfgn  8 Units  Subcutaneous QHS   rosuvastatin   20 mg Oral Daily   Continuous Infusions: PRN Meds:.acetaminophen  **OR** acetaminophen , hydrALAZINE , LORazepam, ondansetron  **OR** ondansetron  (ZOFRAN ) IV  Antibiotics  :    Anti-infectives (From admission, onward)    None         Objective:   Vitals:   09/23/23 2300 09/24/23 0400 09/24/23 0457 09/24/23 0741  BP: (!) 164/71 (!) 161/67 (!) 161/75 (!) 164/68  Pulse: 98 89 89 87  Resp: 16 13 18 15   Temp: 98.5 F (36.9 C)  98 F (36.7 C) (!) 97.5 F (36.4 C)  TempSrc: Oral Oral Oral Oral  SpO2: 100% 99% 100% 99%  Weight:      Height:        Wt Readings from Last 3 Encounters:  09/23/23 49 kg  09/04/23 46.8 kg  04/29/23 46.7 kg     Intake/Output Summary (Last 24 hours) at 09/24/2023 0851 Last data filed at 09/24/2023 0600 Gross per 24 hour  Intake 941 ml  Output 170 ml  Net 771 ml     Physical Exam  Awake Alert, No new F.N deficits, R.Hemiparesis  [place].AT,PERRAL Supple Neck, No JVD,   Symmetrical Chest wall movement, Good air movement bilaterally, CTAB RRR,No Gallops,Rubs or new Murmurs,  +ve B.Sounds, Abd Soft, No tenderness,   No Cyanosis, Clubbing or edema       Data Review:    Recent Labs  Lab 09/23/23 1133 09/24/23 0627  WBC 6.6  --   HGB 5.7*  5.8*  --   HCT 17.5*  17.0*  --   PLT 107* 95*  MCV 91.6  --  MCH 29.8  --   MCHC 32.6  --   RDW 14.8  --   LYMPHSABS 0.9  --   MONOABS 0.3  --   EOSABS 0.2  --   BASOSABS 0.0  --     Recent Labs  Lab 09/23/23 1129 09/23/23 1133 09/24/23 0551 09/24/23 0627  NA 137 136 137  --   K 3.5 3.4* 3.6  --   CL 96* 92* 98  --   CO2 30  --  29  --   ANIONGAP 11  --  10  --   GLUCOSE 121* 119* 92  --   BUN 12 14 21*  --   CREATININE 2.32* 2.50* 3.62*  --   AST 15  --   --   --   ALT 6  --   --   --   ALKPHOS 40  --   --   --   BILITOT 0.6  --   --   --   ALBUMIN 3.1*  --   --   --   DDIMER  --   --   --  0.28  INR 1.1  --   --  1.1  CALCIUM  8.1*  --  8.5*   --       Recent Labs  Lab 09/23/23 1129 09/24/23 0551 09/24/23 0627  DDIMER  --   --  0.28  INR 1.1  --  1.1  CALCIUM  8.1* 8.5*  --     --------------------------------------------------------------------------------------------------------------- Lab Results  Component Value Date   CHOL 133 04/01/2022   HDL 80 04/01/2022   LDLCALC 41 04/01/2022   TRIG 52 04/01/2022   CHOLHDL 1.7 04/01/2022    Lab Results  Component Value Date   HGBA1C 7.5 (H) 04/01/2022   No results for input(s): "TSH", "T4TOTAL", "FREET4", "T3FREE", "THYROIDAB" in the last 72 hours. Recent Labs    09/24/23 0551  VITAMINB12 362  FERRITIN 909*  TIBC 164*  IRON 89  RETICCTPCT 3.3*   ------------------------------------------------------------------------------------------------------------------ Cardiac Enzymes No results for input(s): "CKMB", "TROPONINI", "MYOGLOBIN" in the last 168 hours.  Invalid input(s): "CK"  Micro Results No results found for this or any previous visit (from the past 240 hours).  Radiology Report MR BRAIN WO CONTRAST Result Date: 09/23/2023 CLINICAL DATA:  Initial evaluation for acute neuro deficit, stroke suspected. EXAM: MRI HEAD WITHOUT CONTRAST TECHNIQUE: Multiplanar, multiecho pulse sequences of the brain and surrounding structures were obtained without intravenous contrast. COMPARISON:  CT from earlier the same day. FINDINGS: Brain: Examination severely degraded by motion artifact. Cerebral volume within normal limits. Patchy T2/FLAIR hyperintensity involving the supratentorial cerebral white matter, consistent with chronic small vessel ischemic disease, moderate to advanced in nature. Few scatter remote lacunar infarcts noted about the hemispheric cerebral white matter, deep gray nuclei, and pons. No convincing foci of restricted diffusion to suggest acute or subacute ischemia. Gray-white matter differentiation maintained. No acute intracranial hemorrhage. Few chronic  micro hemorrhages noted about the pons and thalami, likely hypertensive in nature. No mass lesion, midline shift or mass effect. No hydrocephalus or extra-axial fluid collection. Pituitary gland within normal limits. Vascular: Major intracranial vascular flow voids are maintained at the skull base. Skull and upper cervical spine: Craniocervical junction within normal limits. Bone marrow signal intensity grossly normal. No scalp soft tissue abnormality. Sinuses/Orbits: Prior bilateral ocular lens replacement. Left sphenoid sinus retention cyst. Paranasal sinuses are otherwise clear. No mastoid effusion. Other: None. IMPRESSION: 1. Motion degraded exam. 2. No acute intracranial abnormality.  3. Moderate to advanced chronic microvascular ischemic disease with a few scattered remote lacunar infarcts as above. Electronically Signed   By: Virgia Griffins M.D.   On: 09/23/2023 20:13   CT HEAD CODE STROKE WO CONTRAST Result Date: 09/23/2023 CLINICAL DATA:  Code stroke. Neuro deficit, acute, stroke suspected. EXAM: CT HEAD WITHOUT CONTRAST TECHNIQUE: Contiguous axial images were obtained from the base of the skull through the vertex without intravenous contrast. RADIATION DOSE REDUCTION: This exam was performed according to the departmental dose-optimization program which includes automated exposure control, adjustment of the mA and/or kV according to patient size and/or use of iterative reconstruction technique. COMPARISON:  Brain MRI 05/05/2022.  Head CT 05/05/2022. FINDINGS: Brain: Mild generalized cerebral atrophy. Lacunar infarct within the right corona radiata, new from prior exams but chronic in appearance (series 5, image 40). Chronic lacunar infarcts within/about the bilateral deep gray nuclei, not appreciably changed from the prior brain MRI of 05/05/2022. Background age-advanced cerebral white matter disease. There is no acute intracranial hemorrhage. No demarcated cortical infarct. No extra-axial fluid  collection. No evidence of an intracranial mass. No midline shift. Vascular: No hyperdense vessel.  Atherosclerotic calcifications. Skull: No calvarial fracture or aggressive osseous lesion. Sinuses/Orbits: No mass or acute finding within the imaged orbits. 11 mm mucous retention cyst within the left sphenoid sinus. ASPECTS [facility] Stroke Program Early CT Score) - Ganglionic level infarction (caudate, lentiform nuclei, internal capsule, insula, M1-M3 cortex): 7 - Supraganglionic infarction (M4-M6 cortex): 3 Total score (0-10 with 10 being normal): 10 (when discounting chronic infarcts). No evidence of an acute intracranial abnormality. These results were communicated to Dr. Murvin Arthurs At 11:46 amon 5/9/2025by text page via the [facility] messaging system. IMPRESSION: 1. No evidence of an acute intracranial abnormality. 2. Lacunar infarct within the right corona radiata, new from the prior brain MRI of 05/05/2022 but chronic in appearance. 3. Unchanged chronic lacunar infarcts within/about the bilateral deep gray nuclei. 4. Background age-advanced cerebral white matter disease. This likely at least partly reflects chronic small vessel ischemia. However, a history of potential multiple sclerosis was noted on prior radiology examinations and a component of demyelinating disease is possible. 5. Mild generalized cerebral atrophy. 6. 11 mm left sphenoid sinus mucous retention cyst. Electronically Signed   By: Bascom Lily D.O.   On: 09/23/2023 11:49     Signature  -   Lynnwood Sauer M.D on 09/24/2023 at 8:51 AM   -  To page go to [email or web address]

## 2023-09-24 NOTE — Progress Notes (Addendum)
 Patient is alert and oriented x2. Confused to time and situation which is a change. Patient had ativan before MRI to calm down and daughter thinks it has contributed to her forgetfulness. Patient lives at home with daughters and usually gets around with a walker but is currently very weak due to anemia. Received second unit of blood once on unit. Denied pain. Patient is incontinent and came onto unit with brief on-purewick applied. Blood pressure was elevated on arrival but was awaiting MRI to determine if patient had a stroke. By the time MRI resulted, BP had decreased. ACHS w/ss but did not require short acting insulin . Received Semglee  after eating her dinner last night.Patient has prevalent sacral bony prominence-foam placed. Skin dry and flaky but intact. Denies additional needs.

## 2023-09-24 NOTE — Plan of Care (Signed)
°  Problem: Clinical Measurements: Goal: Will remain free from infection Outcome: Progressing   Problem: Nutrition: Goal: Adequate nutrition will be maintained Outcome: Progressing   Problem: Coping: Goal: Level of anxiety will decrease Outcome: Progressing

## 2023-09-24 NOTE — Plan of Care (Signed)

## 2023-09-24 NOTE — Evaluation (Signed)
 Physical Therapy Evaluation Patient Details Name: Hannah [name] MRN: [id number] DOB: 1965/12/05 Today's Date: 09/24/2023  History of Present Illness  58 y.o. F who presented 5/9 with worsening of her right hemiparesis and dysarthria after dialysis. Symptomatic anemia due to intermittent right upper extremity AV fistula site bleeding. Stroke-like symptoms appears to be recrudescence of old stroke symptoms due to anemia. PMH: ESRD on HD, HTN, CAD s/p remote PCI, retinitis on azathioprine, DM, stroke with residual R hemiparesis and dysarthria, chronic pain.  Clinical Impression  Pt admitted with above diagnosis. PTA, requires assist with most ADLs, family assisting. States she could stand and walk short distances with RW and assist from family. Pt required up to mod assist for transfer today due to heavy posterior lean. Participated in pre-gait training, standing balance, and progressed to lateral steps along bed but required assist at all times due to imbalance. VSS. Family present and supportive. Would benefit from continued inpatient follow up therapy, <3 hours/day, unless family can provide increased needs in care. Pt currently with functional limitations due to the deficits listed below (see PT Problem List). Pt will benefit from acute skilled PT to increase their independence and safety with mobility to allow discharge.           If plan is discharge home, recommend the following: A lot of help with walking and/or transfers;A lot of help with bathing/dressing/bathroom;Assistance with cooking/housework;Assist for transportation;Help with stairs or ramp for entrance   Can travel by private vehicle   No (likely soon)    Equipment Recommendations None recommended by PT  Recommendations for Other Services       Functional Status Assessment Patient has had a recent decline in their functional status and demonstrates the ability to make significant improvements in function in a reasonable and  predictable amount of time.     Precautions / Restrictions Precautions Precautions: Fall Recall of Precautions/Restrictions: Intact Restrictions Weight Bearing Restrictions Per Provider Order: No      Mobility  Bed Mobility Overal bed mobility: Needs Assistance Bed Mobility: Supine to Sit, Sit to Supine     Supine to sit: Min assist, HOB elevated Sit to supine: Mod assist   General bed mobility comments: Min assist for trunk to rise. Very slow. Mod assist for LEs and trunk back to supine. Cues throughout.    Transfers Overall transfer level: Needs assistance Equipment used: Rolling walker (2 wheels) Transfers: Sit to/from Stand Sit to Stand: Mod assist           General transfer comment: Mod assist for boost to stand with heavy posterior lean. Practiced several times from EOB with cues for set-up and hand placement. Last attempt Min Assist.    Ambulation/Gait             Pre-gait activities: Weight shift lt and Rt, march in place with up to mod assist for balance. Progressed to min assist with lateral steps, assisted for balance and RW control.    Stairs            Wheelchair Mobility     Tilt Bed    Modified Rankin (Stroke Patients Only)       Balance Overall balance assessment: Needs assistance Sitting-balance support: Single extremity supported, Feet supported Sitting balance-Leahy Scale: Poor Sitting balance - Comments: Intermittent min assist, progressed to CGA EOB towards end of session Postural control: Posterior lean Standing balance support: Bilateral upper extremity supported, Reliant on assistive device for balance Standing balance-Leahy Scale: Poor Standing balance comment: Posterior  lean at all times. max cues for anterior weight shift, minimal response.                             Pertinent Vitals/Pain Pain Assessment Pain Assessment: No/denies pain    Home Living Family/patient expects to be discharged to::  Private residence Living Arrangements: Children Available Help at Discharge: Family;Available 24 hours/day Type of Home: Apartment Home Access: Stairs to enter Entrance Stairs-Rails: None Entrance Stairs-Number of Steps: 1   Home Layout: One level Home Equipment: Tub bench;Rolling Walker (2 wheels);BSC/3in1;Wheelchair - manual;Cane - single point      Prior Function Prior Level of Function : Needs assist             Mobility Comments: Walk short distances with RW if she has help. Uses W/c outside primarily. Can push w/c on her own mostly. ADLs Comments: family assists with bathing, cooking, and dressing. feeds herself.     Extremity/Trunk Assessment   Upper Extremity Assessment Upper Extremity Assessment: Defer to OT evaluation    Lower Extremity Assessment Lower Extremity Assessment: RLE deficits/detail RLE Deficits / Details: Hx prior CVA resulting in Rt hemiparesis. Rt knee flexion 4-/5, knee extension 4/5, ankle DF 4+/5       Communication   Communication Communication: Impaired Factors Affecting Communication: Reduced clarity of speech    Cognition Arousal: Alert Behavior During Therapy: WFL for tasks assessed/performed   PT - Cognitive impairments: Initiation, Problem solving                       PT - Cognition Comments: Delayed, family reports nearly at baseline, was confused after ativan given earlier. Following commands: Intact       Cueing Cueing Techniques: Verbal cues     General Comments General comments (skin integrity, edema, etc.): Daughter present. VSS throughout.    Exercises     Assessment/Plan    PT Assessment Patient needs continued PT services  PT Problem List Decreased strength;Decreased range of motion;Decreased activity tolerance;Decreased balance;Decreased mobility;Decreased coordination;Decreased knowledge of use of DME       PT Treatment Interventions DME instruction;Gait training;Stair training;Functional  mobility training;Therapeutic activities;Therapeutic exercise;Balance training;Neuromuscular re-education;Patient/family education    PT Goals (Current goals can be found in the Care Plan section)  Acute Rehab PT Goals Patient Stated Goal: Get stronger PT Goal Formulation: With patient Time For Goal Achievement: 10/08/23 Potential to Achieve Goals: Good    Frequency Min 2X/week     Co-evaluation               AM-PAC PT "6 Clicks" Mobility  Outcome Measure Help needed turning from your back to your side while in a flat bed without using bedrails?: A Little Help needed moving from lying on your back to sitting on the side of a flat bed without using bedrails?: A Little Help needed moving to and from a bed to a chair (including a wheelchair)?: A Lot Help needed standing up from a chair using your arms (e.g., wheelchair or bedside chair)?: A Lot Help needed to walk in hospital room?: A Lot Help needed climbing 3-5 steps with a railing? : Total 6 Click Score: 13    End of Session Equipment Utilized During Treatment: Gait belt Activity Tolerance: Patient tolerated treatment well;Other (comment) (weakness) Patient left: in bed;with call bell/phone within reach;with bed alarm set;with family/visitor present;with SCD's reapplied Nurse Communication: Mobility status PT Visit Diagnosis: Unsteadiness on feet (R26.81);Muscle  weakness (generalized) (M62.81);Difficulty in walking, not elsewhere classified (R26.2)    Time: 1610-9604 PT Time Calculation (min) (ACUTE ONLY): 28 min   Charges:   PT Evaluation $PT Eval Low Complexity: 1 Low PT Treatments $Therapeutic Activity: 8-22 mins PT General Charges $$ ACUTE PT VISIT: 1 Visit         Jory Ng, PT, DPT [facility]  Rehabilitation Services Physical Therapist Office: [phone] Website: [email or web address]   Alinda Irani 09/24/2023, 3:59 PM

## 2023-09-24 NOTE — Consult Note (Signed)
 [facility] Renal Consultation Note    Indication for Consultation:  Management of ESRD/hemodialysis; anemia, hypertension/volume and secondary hyperparathyroidism   HPI: Hannah [name] is a 58 y.o. female with ESRD on HD MWF, HTN, T2DM, MS, prior CVA with R hemiparesis who presented with stroke like symptoms after dialysis Friday. Code Stroke activated. Brain MRI ordered was negative for acute CVA. Neurology signed off today.  Found to have hemoglobin on 5.7 on arrival. History of bleeding from AV graft. Vascular surgery consulted and planning shuntogram. Transfused 2 units prbcs. CBC pending today.   Seen and examined at bedside. Doing ok denies any further bleeding. No chest pain, dyspnea, nausea/vomiting.  Last dialysis was Friday at her dialysis clinic.   Past Medical History:  Diagnosis Date   CAD S/P PCI x 2 2009   MI 2009 -> PCI x 2 ([place]); 01/2016 Nuc ST Nonischemic with Normal EF   Cerebral infarction (HCC) 07/12/2017   2009 first one, has had a total of 4 stokes- last 2021   Chronic kidney disease    Hemo M-W- F   Diabetes type 2, controlled (HCC)    Hemiparesis affecting right side as late effect of cerebrovascular accident (CVA) (HCC)    Hyperlipidemia    Hypertension    Intractable vomiting with nausea 07/12/2017   Multiple sclerosis (HCC) 2009   Myocardial infarction [facility]) 2009   Stroke [facility])    Past Surgical History:  Procedure Laterality Date   A/V FISTULAGRAM Right 02/12/2021   Procedure: A/V FISTULAGRAM;  Surgeon: Young Hensen, MD;  [facility]: [facility] INVASIVE CV LAB;  Service: Cardiovascular;  Laterality: Right;   A/V FISTULAGRAM Right 05/20/2022   Procedure: A/V Fistulagram;  Surgeon: Young Hensen, MD;  Location: [facility] INVASIVE CV LAB;  Service: Cardiovascular;  Laterality: Right;   A/V FISTULAGRAM N/A 05/05/2023   Procedure: A/V Fistulagram;  Surgeon: Patrick Boor, MD;  Location: [facility] INVASIVE CV LAB;  Service: Cardiovascular;   Laterality: N/A;   A/V SHUNT INTERVENTION N/A 08/16/2023   Procedure: A/V SHUNT INTERVENTION;  Surgeon: Melodie Spry, MD;  Location: [facility] INVASIVE CV LAB;  Service: Cardiovascular;  Laterality: N/A;   APPENDECTOMY     AV FISTULA PLACEMENT Right 10/02/2020   Procedure: RIGHT ARTERIOVENOUS (AV) FISTULA CREATION;  Surgeon: Adine Hoof, MD;  Location: [facility] OR;  Service: Vascular;  Laterality: Right;   AV FISTULA PLACEMENT Right 05/28/2022   Procedure: RIGHT ARM ARTERIOVENOUS (AV) FISTULA REVISION WITH GORETEX GRAFT;  Surgeon: Young Hensen, MD;  Location: [facility] OR;  Service: Vascular;  Laterality: Right;   BASCILIC VEIN TRANSPOSITION Right 11/24/2020   Procedure: SECOND STAGE RIGHT ARM BASCILIC VEIN TRANSPOSITION;  Surgeon: Young Hensen, MD;  Location: [facility] OR;  Service: Vascular;  Laterality: Right;   CYSTOSTOMY W/ BLADDER BIOPSY     INSERTION OF DIALYSIS CATHETER Right 05/28/2022   Procedure: INSERTION OF TUNNELED DIALYSIS CATHETER IN THE RIGHT INTERNAL JUGULAR;  Surgeon: Young Hensen, MD;  Location: [facility] OR;  Service: Vascular;  Laterality: Right;   IR FLUORO GUIDE CV LINE RIGHT  09/29/2020   IR US  GUIDE VASC ACCESS RIGHT  09/29/2020   NM MYOVIEW  LTD  12/25/2015   [place], [place] - NORMAL /NEGATIVE for ischemia. Normal LV Fxn   OVARIAN CYST SURGERY     PERCUTANEOUS CORONARY STENT INTERVENTION (PCI-S)  2009   [place], [place] - 2 stents in settng of MI -- unknown details   PERIPHERAL VASCULAR BALLOON ANGIOPLASTY Right 02/12/2021   Procedure: PERIPHERAL  VASCULAR BALLOON ANGIOPLASTY;  Surgeon: Young Hensen, MD;  Location: [facility] INVASIVE CV LAB;  Service: Cardiovascular;  Laterality: Right;  UPPER ARM FISTULA   PERIPHERAL VASCULAR BALLOON ANGIOPLASTY  05/20/2022   Procedure: PERIPHERAL VASCULAR BALLOON ANGIOPLASTY;  Surgeon: Young Hensen, MD;  [facility] INV[facility];  Service: Cardiovascular;;  rt arm fistula site   PERIPHERAL VASCULAR BALLOON ANGIOPLASTY Right  01/28/2022   Procedure: PERIPHERAL VASCULAR BALLOON ANGIOPLASTY;  Surgeon: Young Hensen, MD;  [facility]: [facility] INVAS[facility];  Service: Cardiovascular;  Laterality: Right;  Arm fistula   PERIPHERAL VASCULAR BALLOON ANGIOPLASTY  05/05/2023   Procedure: PERIPHERAL VASCULAR BALLOON ANGIOPLASTY;  Surgeon: Patrick Boor, MD;  Location: [facility] INVASIVE CV LAB;  Service: Cardiovascular;;  RUA AVF   TRANSTHORACIC ECHOCARDIOGRAM  07/11/2017   a) 12/2015, [facility] [place]): Moderate Conc LVH w/ sigmoid septum.  No LVOT obstruction.  Hyperdynamic LV fxn - EF 65 to 70%.  GR 1 DD & high LAP/LVEDP.  AoV Sclerosis - No AS;; b) 06/2017: [facility]): Normal LV Fxn - EF 60%.  Abnormal septal motion.  Mild LVH mild MAC.  Negative bubble study (for CVA)   TRANSTHORACIC ECHOCARDIOGRAM  12/2020   a) 05/2020: EF 60-65%. No RWMA. Gr2DD. Sm posterior pericardial effusion. No AS.; b) 09/2020: EF 70-75%.  Hyperdynamic.  No RWMA. ? D Fxn/LAP.  Mildly elevated PAP.  Pericardial effusion.  Mild ao V sclerosis cyst.; c) 12/2020 (Limited): EF 65-70%. No RWMA. Gr1-2 DD. Mild Conc LVH- high LAP. Nl PAP. Circumferential effusion w/o tamponade. AoV Sclerosis - no AS. Nl RAP.   ULTRASOUND GUIDANCE FOR VASCULAR ACCESS Right 05/28/2022   Procedure: ULTRASOUND GUIDANCE FOR VASCULAR ACCESS;  Surgeon: Young Hensen, MD;  Location: [facility] OR;  Service: Vascular;  Laterality: Right;   VENOUS ANGIOPLASTY Right 08/16/2023   Procedure: VENOUS ANGIOPLASTY;  Surgeon: Melodie Spry, MD;  Location: [facility] INVASIVE CV LAB;  Service: Cardiovascular;  Laterality: Right;  70% Innominate; 70% Outflow Basilic   History reviewed. No pertinent family history. Social History:  reports that she has never smoked. She has never used smokeless tobacco. She reports that she does not currently use alcohol. She reports that she does not use drugs. Allergies  Allergen Reactions   Ibuprofen Swelling    Swelling to hands, feet and face    Influenza  Vaccines Anaphylaxis and Swelling    fever   Pneumococcal Vaccine Anaphylaxis and Swelling    fever   Promethazine Other (See Comments)    hallucinations   Ambien  [Zolpidem ] Other (See Comments)    Unknown reaction   Codeine Itching   Cyclobenzaprine Other (See Comments)    hallucinations    Hydrocodone -Acetaminophen  Itching   Tape Itching   Tramadol  Hcl Other (See Comments)    hallucinations   Wound Dressing Adhesive Itching    Use paper tape   Oxycodone  Nausea And Vomiting   Prior to Admission medications   Medication Sig Start Date End Date Taking? Authorizing Provider  acetaminophen  (TYLENOL ) 500 MG tablet Take 500 mg by mouth every 6 (six) hours as needed for mild pain (pain score 1-3), moderate pain (pain score 4-6), fever or headache.   Yes [provider]  amLODipine  (NORVASC ) 10 MG tablet Take 1 tablet (10 mg total) by mouth daily. Except on dialysis days do not take a dose. 06/08/22  Yes Eilleen Grates, MD  azaTHIOprine (IMURAN) 50 MG tablet Take 50 mg by mouth daily. 04/26/23 04/25/24 Yes [provider]  calcium   acetate (PHOSLO ) 667 MG capsule Take 667 mg by mouth in the morning, at noon, and at bedtime. 11/11/20  Yes [provider]  carvedilol  (COREG ) 25 MG tablet Take 1 tablet (25 mg total) by mouth 2 (two) times daily with a meal. And as directed. 06/08/22  Yes Eilleen Grates, MD  clopidogrel  (PLAVIX ) 75 MG tablet Take 1 tablet (75 mg total) by mouth daily. 11/03/20  Yes Love, Renay Carota, PA-C  diclofenac  Sodium (VOLTAREN ) 1 % GEL Apply 2 g topically 4 (four) times daily. To leg Patient taking differently: Apply 2 g topically 4 (four) times daily as needed (pain). To leg 11/03/20  Yes Love, Renay Carota, PA-C  famotidine  (PEPCID ) 20 MG tablet Take 1 tablet (20 mg total) by mouth daily. 11/03/20  Yes Love, Renay Carota, PA-C  insulin  degludec (TRESIBA  FLEXTOUCH) 100 UNIT/ML FlexTouch Pen Inject 12 Units into the skin at bedtime. Patient taking differently:  Inject 8 Units into the skin at bedtime. 11/03/20  Yes Love, Renay Carota, PA-C  insulin  lispro (HUMALOG ) 100 UNIT/ML KwikPen Inject 5 Units into the skin 3 (three) times daily. Patient taking differently: Inject 4-6 Units into the skin 3 (three) times daily. 11/04/20  Yes Love, Renay Carota, PA-C  metoCLOPramide  (REGLAN ) 5 MG tablet Take 5 mg by mouth every 6 (six) hours as needed for nausea or vomiting. 09/16/21  Yes [provider]  nitroGLYCERIN  (NITROSTAT ) 0.4 MG SL tablet Place 1 tablet (0.4 mg total) under the tongue every 5 (five) minutes as needed for chest pain. 06/25/20  Yes Angiulli, Everlyn Hockey, PA-C  ondansetron  (ZOFRAN ) 4 MG tablet Take 4 mg by mouth every 8 (eight) hours as needed for nausea or vomiting. 01/07/23  Yes [provider]  polyethylene glycol (MIRALAX  / GLYCOLAX ) 17 g packet Take 17 g by mouth daily. Patient taking differently: Take 17 g by mouth daily as needed for moderate constipation. 11/04/20  Yes Love, Renay Carota, PA-C  rosuvastatin  (CRESTOR ) 20 MG tablet Take 20 mg by mouth daily. 02/05/21  Yes [provider]  senna-docusate (SENOKOT-S) 8.6-50 MG tablet Take 1 tablet by mouth 2 (two) times daily as needed for moderate constipation. Patient taking differently: Take 1 tablet by mouth 2 (two) times daily as needed for mild constipation or moderate constipation. 11/03/20  Yes Love, Renay Carota, PA-C  Continuous Blood Gluc Sensor (FREESTYLE LIBRE 2 SENSOR) MISC USE AS DIRECTED CHANGE EVERY 14 DAYS 12/30/20   [provider]  lidocaine -prilocaine  (EMLA ) cream Apply 1 Application topically daily as needed (port access). 03/02/22   [provider]   Current Facility-Administered Medications  Medication Dose Route Frequency Provider Last Rate Last Admin   acetaminophen  (TYLENOL ) tablet 650 mg  650 mg Oral Q6H PRN Danford, Willis Harter, MD       Or   acetaminophen  (TYLENOL ) suppository 650 mg  650 mg Rectal Q6H PRN Danford, Willis Harter, MD        amLODipine  (NORVASC ) tablet 10 mg  10 mg Oral Daily Danford, Willis Harter, MD   10 mg at 09/24/23 0826   azaTHIOprine (IMURAN) tablet 50 mg  50 mg Oral Daily Danford, [name] P, MD   50 mg at 09/24/23 1610   carvedilol  (COREG ) tablet 25 mg  25 mg Oral BID WC Danford, Willis Harter, MD   25 mg at 09/24/23 9604   clopidogrel  (PLAVIX ) tablet 75 mg  75 mg Oral Daily Ephriam Hashimoto, MD   75 mg at 09/24/23 0825   hydrALAZINE  (APRESOLINE ) injection 10  mg  10 mg Intravenous Q8H PRN Singh, Prashant K, MD   10 mg at 09/24/23 1004   insulin  aspart (novoLOG ) injection 0-5 Units  0-5 Units Subcutaneous QHS Danford, Willis Harter, MD       insulin  aspart (novoLOG ) injection 0-6 Units  0-6 Units Subcutaneous TID WC Danford, Willis Harter, MD   1 Units at 09/24/23 1159   insulin  glargine-yfgn (SEMGLEE ) injection 8 Units  8 Units Subcutaneous QHS Ephriam Hashimoto, MD   8 Units at 09/23/23 2120   LORazepam (ATIVAN) tablet 0.5 mg  0.5 mg Oral Once PRN Danford, Willis Harter, MD       ondansetron  (ZOFRAN ) tablet 4 mg  4 mg Oral Q6H PRN Danford, Willis Harter, MD       Or   ondansetron  (ZOFRAN ) injection 4 mg  4 mg Intravenous Q6H PRN Danford, Willis Harter, MD       pantoprazole (PROTONIX) EC tablet 40 mg  40 mg Oral Daily Singh, Prashant K, MD   40 mg at 09/24/23 1004   rosuvastatin  (CRESTOR ) tablet 20 mg  20 mg Oral Daily Ephriam Hashimoto, MD   20 mg at 09/24/23 0826     ROS: As per HPI otherwise negative.  Physical Exam: Vitals:   09/24/23 0741 09/24/23 0823 09/24/23 1004 09/24/23 1115  BP: (!) 164/68 (!) 175/67 (!) 154/57 (!) 119/53  Pulse: 87 85  87  Resp: 15 20  14   Temp: (!) 97.5 F (36.4 C)   98.4 F (36.9 C)  TempSrc: Oral   Oral  SpO2: 99% 100%  98%  Weight:      Height:         General: Appears comfortable, in no distress  Head: NCAT sclera not icteric MMM Neck: Supple. No JVD appreciated  Lungs: Clear bilaterally Normal WOB  Heart: RRR, 2/6 SEM  Abdomen: soft  non-tender, bowel sounds normal, no masses  Lower extremities:without edema or ischemic changes, no open wounds  Neuro: A & O X 3. Moves all extremities spontaneously. Psych:  Responds to questions appropriately with a normal affect. Dialysis Access: RUE AVG +bruit   Labs: Basic Metabolic Panel: Recent Labs  Lab 09/23/23 1129 09/23/23 1133 09/24/23 0551  NA 137 136 137  K 3.5 3.4* 3.6  CL 96* 92* 98  CO2 30  --  29  GLUCOSE 121* 119* 92  BUN 12 14 21*  CREATININE 2.32* 2.50* 3.62*  CALCIUM  8.1*  --  8.5*   Liver Function Tests: Recent Labs  Lab 09/23/23 1129  AST 15  ALT 6  ALKPHOS 40  BILITOT 0.6  PROT 6.4*  ALBUMIN 3.1*   No results for input(s): "LIPASE", "AMYLASE" in the last 168 hours. No results for input(s): "AMMONIA" in the last 168 hours. CBC: Recent Labs  Lab 09/23/23 1133 09/24/23 0627  WBC 6.6  --   NEUTROABS 5.1  --   HGB 5.7*  5.8*  --   HCT 17.5*  17.0*  --   MCV 91.6  --   PLT 107* 95*   Cardiac Enzymes: No results for input(s): "CKTOTAL", "CKMB", "CKMBINDEX", "TROPONINI" in the last 168 hours. CBG: Recent Labs  Lab 09/23/23 1126 09/23/23 2100 09/24/23 0743 09/24/23 1138  GLUCAP 117* 139* 90 160*   Iron Studies:  Recent Labs    09/24/23 0551  IRON 89  TIBC 164*  FERRITIN 909*   Studies/Results: MR BRAIN WO CONTRAST Result Date: 09/23/2023 CLINICAL DATA:  Initial evaluation for acute neuro deficit, stroke suspected.  EXAM: MRI HEAD WITHOUT CONTRAST TECHNIQUE: Multiplanar, multiecho pulse sequences of the brain and surrounding structures were obtained without intravenous contrast. COMPARISON:  CT from earlier the same day. FINDINGS: Brain: Examination severely degraded by motion artifact. Cerebral volume within normal limits. Patchy T2/FLAIR hyperintensity involving the supratentorial cerebral white matter, consistent with chronic small vessel ischemic disease, moderate to advanced in nature. Few scatter remote lacunar infarcts noted  about the hemispheric cerebral white matter, deep gray nuclei, and pons. No convincing foci of restricted diffusion to suggest acute or subacute ischemia. Gray-white matter differentiation maintained. No acute intracranial hemorrhage. Few chronic micro hemorrhages noted about the pons and thalami, likely hypertensive in nature. No mass lesion, midline shift or mass effect. No hydrocephalus or extra-axial fluid collection. Pituitary gland within normal limits. Vascular: Major intracranial vascular flow voids are maintained at the skull base. Skull and upper cervical spine: Craniocervical junction within normal limits. Bone marrow signal intensity grossly normal. No scalp soft tissue abnormality. Sinuses/Orbits: Prior bilateral ocular lens replacement. Left sphenoid sinus retention cyst. Paranasal sinuses are otherwise clear. No mastoid effusion. Other: None. IMPRESSION: 1. Motion degraded exam. 2. No acute intracranial abnormality. 3. Moderate to advanced chronic microvascular ischemic disease with a few scattered remote lacunar infarcts as above. Electronically Signed   By: Virgia Griffins M.D.   On: 09/23/2023 20:13   CT HEAD CODE STROKE WO CONTRAST Result Date: 09/23/2023 CLINICAL DATA:  Code stroke. Neuro deficit, acute, stroke suspected. EXAM: CT HEAD WITHOUT CONTRAST TECHNIQUE: Contiguous axial images were obtained from the base of the skull through the vertex without intravenous contrast. RADIATION DOSE REDUCTION: This exam was performed according to the departmental dose-optimization program which includes automated exposure control, adjustment of the mA and/or kV according to patient size and/or use of iterative reconstruction technique. COMPARISON:  Brain MRI 05/05/2022.  Head CT 05/05/2022. FINDINGS: Brain: Mild generalized cerebral atrophy. Lacunar infarct within the right corona radiata, new from prior exams but chronic in appearance (series 5, image 40). Chronic lacunar infarcts within/about the  bilateral deep gray nuclei, not appreciably changed from the prior brain MRI of 05/05/2022. Background age-advanced cerebral white matter disease. There is no acute intracranial hemorrhage. No demarcated cortical infarct. No extra-axial fluid collection. No evidence of an intracranial mass. No midline shift. Vascular: No hyperdense vessel.  Atherosclerotic calcifications. Skull: No calvarial fracture or aggressive osseous lesion. Sinuses/Orbits: No mass or acute finding within the imaged orbits. 11 mm mucous retention cyst within the left sphenoid sinus. ASPECTS [facility] Stroke Program Early CT Score) - Ganglionic level infarction (caudate, lentiform nuclei, internal capsule, insula, M1-M3 cortex): 7 - Supraganglionic infarction (M4-M6 cortex): 3 Total score (0-10 with 10 being normal): 10 (when discounting chronic infarcts). No evidence of an acute intracranial abnormality. These results were communicated to Dr. Murvin Arthurs At 11:46 amon 5/9/2025by text page via the [facility] messaging system. IMPRESSION: 1. No evidence of an acute intracranial abnormality. 2. Lacunar infarct within the right corona radiata, new from the prior brain MRI of 05/05/2022 but chronic in appearance. 3. Unchanged chronic lacunar infarcts within/about the bilateral deep gray nuclei. 4. Background age-advanced cerebral white matter disease. This likely at least partly reflects chronic small vessel ischemia. However, a history of potential multiple sclerosis was noted on prior radiology examinations and a component of demyelinating disease is possible. 5. Mild generalized cerebral atrophy. 6. 11 mm left sphenoid sinus mucous retention cyst. Electronically Signed   By: Bascom Lily D.O.   On: 09/23/2023 11:49    Dialysis Orders:  AF MWF 3:30 350/A1.5x  EDW 46kg 3K/2.5Ca AVG 16g x2 -Heparin  1500 bolus -Mircera 50 q 2 wks (last 5/5) -Venofer  100 x 5  -Hectorol  1 mcg TIW  Assessment/Plan: ABLA 2/2 bleeding from AV graft -- Subacute oozing  from graft for > 1 month. Vascular surgery consulted. Concern for central venous stenosis. Plan for shuntogram 5/12.  Transfused 2 u prbcs on 5/9.  Follow CBC.  Stroke like symptoms - H/o CVA w residual R hemiparesis. No acute CVA on MRI. Neuro signed off.  ESRD -  HD MWF. Had HD on Friday. No urgent dialysis indications today. Next HD Mon. No heparin   Hypertension - BP elevated on arrival. Home meds resumed. Improving.  Anemia  - As above. Received Mircera on 5/5.  Secondary HPTH  - Ca acceptable. Ca acetate binder  T2DM - Insulin  per primary  CAD s/p PCI   Elona Hal PA-C [facility] 09/24/2023, 1:28 PM

## 2023-09-24 NOTE — Care Management Obs Status (Signed)
 MEDICARE OBSERVATION STATUS NOTIFICATION   Patient Details  Name: Hannah [name] MRN: [id number] Date of Birth: December 17, 1965   Medicare Observation Status Notification Given:  Yes    Omie Bickers, RN 09/24/2023, 3:50 PM

## 2023-09-24 NOTE — Progress Notes (Signed)
 No stroke on MRI Brain. Suspect recrudescence of prior stroke symptoms secondary to anemia.  Neurology inpatient team will signoff. Please feel free to contact us  with any questions or concerns.  Deserae Jennings [facility]

## 2023-09-25 DIAGNOSIS — T82898A Other specified complication of vascular prosthetic devices, implants and grafts, initial encounter: Secondary | ICD-10-CM | POA: Diagnosis not present

## 2023-09-25 DIAGNOSIS — Z992 Dependence on renal dialysis: Secondary | ICD-10-CM | POA: Diagnosis not present

## 2023-09-25 DIAGNOSIS — D62 Acute posthemorrhagic anemia: Secondary | ICD-10-CM | POA: Diagnosis not present

## 2023-09-25 DIAGNOSIS — N186 End stage renal disease: Secondary | ICD-10-CM | POA: Diagnosis not present

## 2023-09-25 LAB — GLUCOSE, CAPILLARY
Glucose-Capillary: 64 mg/dL — ABNORMAL LOW (ref 70–99)
Glucose-Capillary: 112 mg/dL — ABNORMAL HIGH (ref 70–99)
Glucose-Capillary: 183 mg/dL — ABNORMAL HIGH (ref 70–99)
Glucose-Capillary: 200 mg/dL — ABNORMAL HIGH (ref 70–99)
Glucose-Capillary: 241 mg/dL — ABNORMAL HIGH (ref 70–99)
Glucose-Capillary: 208 mg/dL — ABNORMAL HIGH (ref 70–99)

## 2023-09-25 LAB — CBC WITH DIFFERENTIAL/PLATELET
Abs Immature Granulocytes: 0.02 10*3/uL (ref 0.00–0.07)
Basophils Absolute: 0 10*3/uL (ref 0.0–0.1)
Basophils Relative: 1 %
Eosinophils Absolute: 0.2 10*3/uL (ref 0.0–0.5)
Eosinophils Relative: 4 %
HCT: 26.3 % — ABNORMAL LOW (ref 36.0–46.0)
Hemoglobin: 8.8 g/dL — ABNORMAL LOW (ref 12.0–15.0)
Immature Granulocytes: 0 %
Lymphocytes Relative: 25 %
Lymphs Abs: 1.4 10*3/uL (ref 0.7–4.0)
MCH: 28.9 pg (ref 26.0–34.0)
MCHC: 33.5 g/dL (ref 30.0–36.0)
MCV: 86.5 fL (ref 80.0–100.0)
Monocytes Absolute: 0.3 10*3/uL (ref 0.1–1.0)
Monocytes Relative: 5 %
Neutro Abs: 3.6 10*3/uL (ref 1.7–7.7)
Neutrophils Relative %: 65 %
Platelets: 116 10*3/uL — ABNORMAL LOW (ref 150–400)
RBC: 3.04 MIL/uL — ABNORMAL LOW (ref 3.87–5.11)
RDW: 18.9 % — ABNORMAL HIGH (ref 11.5–15.5)
WBC: 5.6 10*3/uL (ref 4.0–10.5)
nRBC: 0.4 % — ABNORMAL HIGH (ref 0.0–0.2)

## 2023-09-25 LAB — HAPTOGLOBIN: Haptoglobin: 60 mg/dL (ref 33–346)

## 2023-09-25 LAB — HEPATITIS B SURFACE ANTIGEN: Hepatitis B Surface Ag: NONREACTIVE

## 2023-09-25 MED ORDER — NEPRO/CARBSTEADY PO LIQD
237.0000 mL | Freq: Two times a day (BID) | ORAL | Status: DC
Start: 2023-09-26 — End: 2023-09-27

## 2023-09-25 MED ORDER — CHLORHEXIDINE GLUCONATE CLOTH 2 % EX PADS
6.0000 | MEDICATED_PAD | Freq: Every day | CUTANEOUS | Status: DC
  Administered 2023-09-27: 6 via TOPICAL

## 2023-09-25 MED ORDER — CALCIUM ACETATE (PHOS BINDER) 667 MG PO CAPS
667.0000 mg | ORAL_CAPSULE | Freq: Three times a day (TID) | ORAL | Status: DC
  Administered 2023-09-25 – 2023-09-27 (×4): 667 mg via ORAL
  Filled 2023-09-25 (×5): qty 1

## 2023-09-25 NOTE — Progress Notes (Signed)
 PROGRESS NOTE                                                                                                                                                                                                             Patient Demographics:    Hannah [name], is a 58 y.o. female, DOB - 1966/04/01, WUJ:[id number]  Outpatient Primary MD for the patient is Arlys Berke, MD    LOS - 0  Admit date - 09/23/2023    Chief Complaint  Patient presents with   Code Stroke       Brief Narrative (HPI from H&P)   58 y.o. F with ESRD on HD, HTN, CAD s/p remote PCI, retinitis on azathioprine, DM, stroke with residual R hemiparesis and dysarthria, chronic pain who presented with worsening of her right hemiparesis and dysarthria after dialysis.  NIHSS 5.  In the ER workup was negative for stroke eventually CT and MRI nonacute, her blood work in the ER suggested her hemoglobin was 5.7, patient apparently has been having a lot of issues with her right arm fistula bleed after every dialysis session related to her central stenosis.  She was admitted for further care.    Subjective:   Patient in bed, appears comfortable, denies any headache, no fever, no chest pain or pressure, no shortness of breath , no abdominal pain. No focal weakness.   Assessment  & Plan :   Symptomatic anemia due to intermittent right upper extremity AV fistula site bleeding which has been subacute to chronic. She has no history suggestive of GI bleed, DIC panel negative, has received 2 units of packed RBC transfusion on 09/23/2023, posttransfusion clinically back to baseline, recheck CBC, no signs of ongoing bleeding.  Nephrology on board along with vascular surgery, vascular surgery contemplating right arm shuntogram with a possible shunt placement to relieve central stenosis on 09/26/2023.  Continue to monitor CBC.  Stroke-like symptoms This appears to be recrudescence of old  stroke symptoms due to anemia. Neurology evaluated, feel new acute stroke is unlikely, MRI negative, continue Plavix  and Crestor  along with supportive care.  Close to her baseline now has chronic right-sided hemiparesis from previous stroke.  Chorioretinitis - Continue azathioprine  CAD S/P PCI x 2  - Continue Plavix , Crestor , carvedilol   ESRD on hemodialysis [facility])  - Consult nephrology MWF schedule.  Essential  hypertension Blood pressure elevated, continue amlodipine , carvedilol , as needed hydralazine  added  Hyperlipidemia associated with type 2 diabetes mellitus (HCC)  - Continue Crestor   Type 2 diabetes mellitus (HCC)  - Continue Tresiba , - Sliding scale corrections  Lab Results  Component Value Date   HGBA1C 7.5 (H) 04/01/2022   CBG (last 3)  Recent Labs    09/25/23 0538 09/25/23 0632 09/25/23 0742  GLUCAP 64* 112* 183*         Condition - Extremely Guarded  Family Communication  :  daughter updated on 09/24/23 family member bedside on 09/25/2023  Code Status :  Full  Consults  :  VVS, renal  PUD Prophylaxis : PPI   Procedures  :      MRI - 1. Motion degraded exam. 2. No acute intracranial abnormality. 3. Moderate to advanced chronic microvascular ischemic disease with a few scattered remote lacunar infarcts as above.      Disposition Plan  :    Status is: Observation   DVT Prophylaxis  :    SCDs Start: 09/23/23 1938    Lab Results  Component Value Date   PLT 116 (L) 09/25/2023    Diet :  Diet Order             Diet renal/carb modified with fluid restriction Diet-HS Snack? Nothing; Fluid restriction: 1200 mL Fluid; Room service appropriate? Yes; Fluid consistency: Thin  Diet effective now                    Inpatient Medications  Scheduled Meds:  amLODipine   10 mg Oral Daily   azaTHIOprine  50 mg Oral Daily   carvedilol   25 mg Oral BID WC   clopidogrel   75 mg Oral Daily   insulin  aspart  0-5 Units Subcutaneous QHS   insulin  aspart   0-6 Units Subcutaneous TID WC   insulin  glargine-yfgn  8 Units Subcutaneous QHS   pantoprazole  40 mg Oral Daily   rosuvastatin   20 mg Oral Daily   Continuous Infusions: PRN Meds:.acetaminophen  **OR** acetaminophen , hydrALAZINE , LORazepam, ondansetron  **OR** ondansetron  (ZOFRAN ) IV  Antibiotics  :    Anti-infectives (From admission, onward)    None         Objective:   Vitals:   09/24/23 2011 09/25/23 0005 09/25/23 0341 09/25/23 0740  BP: 134/63  (!) 170/72 (!) 139/58  Pulse:    87  Resp:    13  Temp: 98.2 F (36.8 C) 98.3 F (36.8 C) 98.4 F (36.9 C) 98.2 F (36.8 C)  TempSrc: Oral Oral Oral Oral  SpO2:    99%  Weight:      Height:        Wt Readings from Last 3 Encounters:  09/23/23 49 kg  09/04/23 46.8 kg  04/29/23 46.7 kg     Intake/Output Summary (Last 24 hours) at 09/25/2023 0941 Last data filed at 09/25/2023 0843 Gross per 24 hour  Intake 118 ml  Output 150 ml  Net -32 ml     Physical Exam  Awake Alert, No new F.N deficits, R.Hemiparesis, right arm AV fistula/shunt site stable with no bleeding .AT,PERRAL Supple Neck, No JVD,   Symmetrical Chest wall movement, Good air movement bilaterally, CTAB RRR,No Gallops,Rubs or new Murmurs,  +ve B.Sounds, Abd Soft, No tenderness,   No Cyanosis, Clubbing or edema       Data Review:    Recent Labs  Lab 09/23/23 1133 09/24/23 0627 09/24/23 1442 09/25/23 0601  WBC 6.6  --  5.1 5.6  HGB 5.7*  5.8*  --  8.0* 8.8*  HCT 17.5*  17.0*  --  23.7* 26.3*  PLT 107* 95* 106* 116*  MCV 91.6  --  85.6 86.5  MCH 29.8  --  28.9 28.9  MCHC 32.6  --  33.8 33.5  RDW 14.8  --  18.9* 18.9*  LYMPHSABS 0.9  --   --  1.4  MONOABS 0.3  --   --  0.3  EOSABS 0.2  --   --  0.2  BASOSABS 0.0  --   --  0.0    Recent Labs  Lab 09/23/23 1129 09/23/23 1133 09/24/23 0551 09/24/23 0627  NA 137 136 137  --   K 3.5 3.4* 3.6  --   CL 96* 92* 98  --   CO2 30  --  29  --   ANIONGAP 11  --  10  --   GLUCOSE 121*  119* 92  --   BUN 12 14 21*  --   CREATININE 2.32* 2.50* 3.62*  --   AST 15  --   --   --   ALT 6  --   --   --   ALKPHOS 40  --   --   --   BILITOT 0.6  --   --   --   ALBUMIN 3.1*  --   --   --   DDIMER  --   --   --  0.28  INR 1.1  --   --  1.1  CALCIUM  8.1*  --  8.5*  --       Recent Labs  Lab 09/23/23 1129 09/24/23 0551 09/24/23 0627  DDIMER  --   --  0.28  INR 1.1  --  1.1  CALCIUM  8.1* 8.5*  --     --------------------------------------------------------------------------------------------------------------- Lab Results  Component Value Date   CHOL 133 04/01/2022   HDL 80 04/01/2022   LDLCALC 41 04/01/2022   TRIG 52 04/01/2022   CHOLHDL 1.7 04/01/2022    Lab Results  Component Value Date   HGBA1C 7.5 (H) 04/01/2022   No results for input(s): "TSH", "T4TOTAL", "FREET4", "T3FREE", "THYROIDAB" in the last 72 hours. Recent Labs    09/24/23 0551  VITAMINB12 362  FOLATE 7.1  FERRITIN 909*  TIBC 164*  IRON 89  RETICCTPCT 3.3*   ------------------------------------------------------------------------------------------------------------------ Cardiac Enzymes No results for input(s): "CKMB", "TROPONINI", "MYOGLOBIN" in the last 168 hours.  Invalid input(s): "CK"  Micro Results No results found for this or any previous visit (from the past 240 hours).  Radiology Report MR BRAIN WO CONTRAST Result Date: 09/23/2023 CLINICAL DATA:  Initial evaluation for acute neuro deficit, stroke suspected. EXAM: MRI HEAD WITHOUT CONTRAST TECHNIQUE: Multiplanar, multiecho pulse sequences of the brain and surrounding structures were obtained without intravenous contrast. COMPARISON:  CT from earlier the same day. FINDINGS: Brain: Examination severely degraded by motion artifact. Cerebral volume within normal limits. Patchy T2/FLAIR hyperintensity involving the supratentorial cerebral white matter, consistent with chronic small vessel ischemic disease, moderate to advanced in  nature. Few scatter remote lacunar infarcts noted about the hemispheric cerebral white matter, deep gray nuclei, and pons. No convincing foci of restricted diffusion to suggest acute or subacute ischemia. Gray-white matter differentiation maintained. No acute intracranial hemorrhage. Few chronic micro hemorrhages noted about the pons and thalami, likely hypertensive in nature. No mass lesion, midline shift or mass effect. No hydrocephalus or extra-axial fluid collection. Pituitary gland within normal limits. Vascular: Major  intracranial vascular flow voids are maintained at the skull base. Skull and upper cervical spine: Craniocervical junction within normal limits. Bone marrow signal intensity grossly normal. No scalp soft tissue abnormality. Sinuses/Orbits: Prior bilateral ocular lens replacement. Left sphenoid sinus retention cyst. Paranasal sinuses are otherwise clear. No mastoid effusion. Other: None. IMPRESSION: 1. Motion degraded exam. 2. No acute intracranial abnormality. 3. Moderate to advanced chronic microvascular ischemic disease with a few scattered remote lacunar infarcts as above. Electronically Signed   By: Virgia Griffins M.D.   On: 09/23/2023 20:13   CT HEAD CODE STROKE WO CONTRAST Result Date: 09/23/2023 CLINICAL DATA:  Code stroke. Neuro deficit, acute, stroke suspected. EXAM: CT HEAD WITHOUT CONTRAST TECHNIQUE: Contiguous axial images were obtained from the base of the skull through the vertex without intravenous contrast. RADIATION DOSE REDUCTION: This exam was performed according to the departmental dose-optimization program which includes automated exposure control, adjustment of the mA and/or kV according to patient size and/or use of iterative reconstruction technique. COMPARISON:  Brain MRI 05/05/2022.  Head CT 05/05/2022. FINDINGS: Brain: Mild generalized cerebral atrophy. Lacunar infarct within the right corona radiata, new from prior exams but chronic in appearance (series 5,  image 40). Chronic lacunar infarcts within/about the bilateral deep gray nuclei, not appreciably changed from the prior brain MRI of 05/05/2022. Background age-advanced cerebral white matter disease. There is no acute intracranial hemorrhage. No demarcated cortical infarct. No extra-axial fluid collection. No evidence of an intracranial mass. No midline shift. Vascular: No hyperdense vessel.  Atherosclerotic calcifications. Skull: No calvarial fracture or aggressive osseous lesion. Sinuses/Orbits: No mass or acute finding within the imaged orbits. 11 mm mucous retention cyst within the left sphenoid sinus. ASPECTS [facility] Stroke Program Early CT Score) - Ganglionic level infarction (caudate, lentiform nuclei, internal capsule, insula, M1-M3 cortex): 7 - Supraganglionic infarction (M4-M6 cortex): 3 Total score (0-10 with 10 being normal): 10 (when discounting chronic infarcts). No evidence of an acute intracranial abnormality. These results were communicated to Dr. Murvin Arthurs At 11:46 amon 5/9/2025by text page via the [facility] messaging system. IMPRESSION: 1. No evidence of an acute intracranial abnormality. 2. Lacunar infarct within the right corona radiata, new from the prior brain MRI of 05/05/2022 but chronic in appearance. 3. Unchanged chronic lacunar infarcts within/about the bilateral deep gray nuclei. 4. Background age-advanced cerebral white matter disease. This likely at least partly reflects chronic small vessel ischemia. However, a history of potential multiple sclerosis was noted on prior radiology examinations and a component of demyelinating disease is possible. 5. Mild generalized cerebral atrophy. 6. 11 mm left sphenoid sinus mucous retention cyst. Electronically Signed   By: Bascom Lily D.O.   On: 09/23/2023 11:49     Signature  -   Lynnwood Sauer M.D on 09/25/2023 at 9:41 AM   -  To page go to [email or web address]

## 2023-09-25 NOTE — TOC Initial Note (Signed)
 Transition of Care [facility]) - Initial/Assessment Note    Patient Details  Name: Hannah [name] MRN: [id number] Date of Birth: 10-26-1965  [facility] CM/SW Contact:    Hilda Lovings, LCSW Phone Number: 09/25/2023, 10:51 AM  Clinical Narrative:                 CSW followed-up disposition recommendations (SNF placement).  CSW completed initial TOC work-up/assessment as noted by the following below.   CSW spoke with the patient to review SNF referral process per clinical recommendations and assessed the pt's SNF preference.   The patient expressed no SNF preference.   CSW spoke with (Daughter: Macaylah, Bater / [phone]) and no preference.   CSW updated the bedside nurse and additional clinical members to the information above regarding SNF initial efforts for SNF placement.    CSW referral efforts to support the patient's disposition FL2:  PASRR:  SNF referrals:   TOC Disposition follow-up needs  Please provide the patient or natural support with bed-offer updates.  Please continue with SNF placement efforts. Please update the clinical team to SNF placement efforts:  No other needs identified by this Clinical research associate currently. Patient needs and current disposition to be followed by    Expected Discharge Plan: Skilled Nursing Facility Barriers to Discharge: Continued Medical Work up   Patient Goals and CMS Choice     Choice offered to / list presented to : Adult Children [place] ownership interest in [facility].provided to:: Adult Children    Expected Discharge Plan and Services In-house Referral: Clinical Social Work   Post Acute Care Choice: Skilled Nursing Facility Living arrangements for the past 2 months: Single Family Home                                      Prior Living Arrangements/Services Living arrangements for the past 2 months: Single Family Home Lives with:: Self Patient language and need for interpreter reviewed:: No Do  you feel safe going back to the place where you live?: Yes      Need for Family Participation in Patient Care: Yes (Comment) Care giver support system in place?: Yes (comment)   Criminal Activity/Legal Involvement Pertinent to Current Situation/Hospitalization: No - Comment as needed  Activities of Daily Living   ADL Screening (condition at time of admission) Independently performs ADLs?: No Does the patient have a NEW difficulty with bathing/dressing/toileting/self-feeding that is expected to last >3 days?: No Does the patient have a NEW difficulty with getting in/out of bed, walking, or climbing stairs that is expected to last >3 days?: No Does the patient have a NEW difficulty with communication that is expected to last >3 days?: No Is the patient deaf or have difficulty hearing?: No Does the patient have difficulty seeing, even when wearing glasses/contacts?: No Does the patient have difficulty concentrating, remembering, or making decisions?: Yes  Permission Sought/Granted Permission sought to share information with : Case Manager, Magazine features editor, Family Supports Permission granted to share information with : Yes, Verbal Permission Granted  Share Information with NAME: Angelus, Conca     Permission granted to share info w Relationship: Daughter  Permission granted to share info w Contact Information: [phone]  Emotional Assessment   Attitude/Demeanor/Rapport: Unable to Assess Affect (typically observed): Unable to Assess Orientation: : Oriented to Place, Oriented to  Time Alcohol / Substance Use: Not Applicable Psych Involvement: No (comment)  Admission diagnosis:  Symptomatic anemia [D64.9] Patient Active Problem List   Diagnosis Date Noted   Anemia 09/23/2023   Chorioretinitis 09/23/2023   Dependent edema 06/09/2022   Long term (current) use of insulin  (HCC) 01/14/2022   Chronic constipation 06/09/2021   Altered mental status, unspecified 01/12/2021    Other chronic postprocedural pain 01/09/2021   Spastic hemiplegia due to cerebrovascular disease (HCC) 11/03/2020   Debility 10/18/2020   ESRD on hemodialysis (HCC)    SOB (shortness of breath) 09/27/2020   Volume overload 09/26/2020   CKD (chronic kidney disease) stage 5, GFR less than 15 ml/min (HCC) 09/13/2020   H/O: CVA (cerebrovascular accident) 09/12/2020   Chronic kidney disease (CKD), stage IV (severe) (HCC)    Type 2 diabetes mellitus (HCC)    Essential hypertension    Right pontine cerebrovascular accident (HCC) 06/12/2020   Stroke-like symptoms 06/09/2020   Acute kidney injury superimposed on CKD (HCC) 06/09/2020   Cerebral thrombosis with cerebral infarction 06/09/2020   Hyperlipidemia associated with type 2 diabetes mellitus (HCC)    Bimalleolar ankle fracture 11/24/2018   Lower abdominal pain 07/28/2017   Bladder mass 07/18/2017   Painless hematuria 07/18/2017   Intractable vomiting with nausea 07/12/2017   Hemiparesis affecting right side as late effect of cerebrovascular accident (CVA) (HCC) 09/22/2016   AKI (acute kidney injury) (HCC) 01/08/2016   Multiple sclerosis (HCC) 2009   CAD S/P PCI x 2 2009   History of myocardial infarction due to atherothrombotic coronary artery disease 2009   PCP:  Arlys Berke, MD Pharmacy:   [facility] [address] [place] - [address] [place] [address] Phone: [phone] Fax: [phone]     Social Drivers of Health (SDOH) Social History: SDOH Screenings   Food Insecurity: No Food Insecurity (09/23/2023)  Housing: Low Risk  (09/23/2023)  Transportation Needs: No Transportation Needs (09/23/2023)  Utilities: Not At Risk (09/23/2023)  Depression (PHQ2-9): Low Risk  (01/09/2021)  Tobacco Use: Low Risk  (09/23/2023)   SDOH Interventions:     Readmission Risk Interventions     No data to display

## 2023-09-25 NOTE — Progress Notes (Signed)
 Hypoglycemic Event  CBG: 64  Treatment: 4 oz juice/soda  Symptoms: Sweaty  Follow-up CBG: [id number] CBG Result:112  Possible Reasons for Event: Unknown  Comments/MD notified:yes    [name]  [name]

## 2023-09-25 NOTE — Progress Notes (Signed)
  Progress Note    09/25/2023 10:02 AM * No surgery found *  Subjective: Feeling better  Vitals:   09/25/23 0341 09/25/23 0740  BP: (!) 170/72 (!) 139/58  Pulse:  87  Resp:  13  Temp: 98.4 F (36.9 C) 98.2 F (36.8 C)  SpO2:  99%    Physical Exam: Awake alert and oriented Nonlabored respiration Right upper extremity graft with pulsatility  CBC    Component Value Date/Time   WBC 5.6 09/25/2023 0601   RBC 3.04 (L) 09/25/2023 0601   HGB 8.8 (L) 09/25/2023 0601   HCT 26.3 (L) 09/25/2023 0601   PLT 116 (L) 09/25/2023 0601   MCV 86.5 09/25/2023 0601   MCH 28.9 09/25/2023 0601   MCHC 33.5 09/25/2023 0601   RDW 18.9 (H) 09/25/2023 0601   LYMPHSABS 1.4 09/25/2023 0601   MONOABS 0.3 09/25/2023 0601   EOSABS 0.2 09/25/2023 0601   BASOSABS 0.0 09/25/2023 0601    BMET    Component Value Date/Time   NA 137 09/24/2023 0551   K 3.6 09/24/2023 0551   CL 98 09/24/2023 0551   CO2 29 09/24/2023 0551   GLUCOSE 92 09/24/2023 0551   BUN 21 (H) 09/24/2023 0551   CREATININE 3.62 (H) 09/24/2023 0551   CALCIUM  8.5 (L) 09/24/2023 0551   GFRNONAA 14 (L) 09/24/2023 0551    INR    Component Value Date/Time   INR 1.1 09/24/2023 0627     Intake/Output Summary (Last 24 hours) at 09/25/2023 1002 Last data filed at 09/25/2023 0843 Gross per 24 hour  Intake 118 ml  Output 150 ml  Net -32 ml     Assessment:  58 y.o. female is here with symptomatic anemia with bleeding graft at completion of dialysis extremity with recent treatment of central venous stenosis.  Plan: Fistulogram tomorrow  Harim Bi C. Vikki Graves, MD [facility] [place] Office: [phone] Pager: [phone]  09/25/2023 10:02 AM

## 2023-09-25 NOTE — [place] FL2 (Signed)
 [place]  MEDICAID [id number] LEVEL OF CARE FORM     IDENTIFICATION  Patient Name: Hannah [name] Birthdate: Aug 11, 1965 Sex: female Admission Date (Current Location): 09/23/2023  [facility] and [place] Number:  Producer, television/film/video and Address:  [facility], [address] [place], [place] [address]      Provider Number: [id number]  Attending Physician Name and Address:  Cala Castleman, MD  Relative Name and Phone Number:  Daughter: Ahmirah, Whiteley / [phone]  and  Daughter: Georgianna Kirschner / [phone]    Current Level of Care: Hospital Recommended Level of Care: Skilled Nursing Facility Prior Approval Number:    Date Approved/Denied: 09/25/23 PASRR Number: [id number]  Discharge Plan: SNF    Current Diagnoses: Patient Active Problem List   Diagnosis Date Noted   Anemia 09/23/2023   Chorioretinitis 09/23/2023   Dependent edema 06/09/2022   Long term (current) use of insulin  (HCC) 01/14/2022   Chronic constipation 06/09/2021   Altered mental status, unspecified 01/12/2021   Other chronic postprocedural pain 01/09/2021   Spastic hemiplegia due to cerebrovascular disease (HCC) 11/03/2020   Debility 10/18/2020   ESRD on hemodialysis (HCC)    SOB (shortness of breath) 09/27/2020   Volume overload 09/26/2020   CKD (chronic kidney disease) stage 5, GFR less than 15 ml/min (HCC) 09/13/2020   H/O: CVA (cerebrovascular accident) 09/12/2020   Chronic kidney disease (CKD), stage IV (severe) (HCC)    Type 2 diabetes mellitus (HCC)    Essential hypertension    Right pontine cerebrovascular accident (HCC) 06/12/2020   Stroke-like symptoms 06/09/2020   Acute kidney injury superimposed on CKD (HCC) 06/09/2020   Cerebral thrombosis with cerebral infarction 06/09/2020   Hyperlipidemia associated with type 2 diabetes mellitus (HCC)    Bimalleolar ankle fracture 11/24/2018   Lower abdominal pain 07/28/2017   Bladder mass 07/18/2017   Painless hematuria  07/18/2017   Intractable vomiting with nausea 07/12/2017   Hemiparesis affecting right side as late effect of cerebrovascular accident (CVA) (HCC) 09/22/2016   AKI (acute kidney injury) (HCC) 01/08/2016   Multiple sclerosis (HCC) 2009   CAD S/P PCI x 2 2009   History of myocardial infarction due to atherothrombotic coronary artery disease 2009    Orientation RESPIRATION BLADDER Height & Weight     Time, Place, Self  O2 Incontinent Weight: 108 lb 0.4 oz (49 kg) Height:  5\' 2"  (157.5 cm)  BEHAVIORAL SYMPTOMS/MOOD NEUROLOGICAL BOWEL NUTRITION STATUS      Continent Diet  AMBULATORY STATUS COMMUNICATION OF NEEDS Skin   Limited Assist Verbally                         Personal Care Assistance Level of Assistance  Dressing     Dressing Assistance: Limited assistance     Functional Limitations Info  Sight, Hearing Sight Info: Adequate Hearing Info: Adequate      SPECIAL CARE FACTORS FREQUENCY  PT (By licensed PT), OT (By licensed OT)     PT Frequency: 5x wk OT Frequency: 3x wk            Contractures Contractures Info: Not present    Additional Factors Info  Code Status Code Status Info: [place]             Current Medications (09/25/2023):  This is the current hospital active medication list Current Facility-Administered Medications  Medication Dose Route Frequency Provider Last Rate Last Admin   acetaminophen  (TYLENOL ) tablet 650 mg  650 mg Oral Q6H  PRN Ephriam Hashimoto, MD       Or   acetaminophen  (TYLENOL ) suppository 650 mg  650 mg Rectal Q6H PRN Danford, Willis Harter, MD       amLODipine  (NORVASC ) tablet 10 mg  10 mg Oral Daily Danford, Willis Harter, MD   10 mg at 09/25/23 0831   azaTHIOprine (IMURAN) tablet 50 mg  50 mg Oral Daily Ephriam Hashimoto, MD   50 mg at 09/25/23 0840   calcium  acetate (PHOSLO ) capsule 667 mg  667 mg Oral TID WC Ejigiri, Ogechi Grace, PA-C   667 mg at 09/25/23 1304   carvedilol  (COREG ) tablet 25 mg  25 mg Oral BID  WC Ephriam Hashimoto, MD   25 mg at 09/25/23 0831   [START ON 09/26/2023] Chlorhexidine  Gluconate Cloth 2 % PADS 6 each  6 each Topical Q0600 Elona Hal, PA-C       clopidogrel  (PLAVIX ) tablet 75 mg  75 mg Oral Daily Danford, [name] P, MD   75 mg at 09/25/23 0831   hydrALAZINE  (APRESOLINE ) injection 10 mg  10 mg Intravenous Q8H PRN Singh, Prashant K, MD   10 mg at 09/24/23 1004   insulin  aspart (novoLOG ) injection 0-5 Units  0-5 Units Subcutaneous QHS Ephriam Hashimoto, MD   2 Units at 09/24/23 2149   insulin  aspart (novoLOG ) injection 0-6 Units  0-6 Units Subcutaneous TID WC Ephriam Hashimoto, MD   1 Units at 09/25/23 1303   insulin  glargine-yfgn (SEMGLEE ) injection 8 Units  8 Units Subcutaneous QHS Ephriam Hashimoto, MD   8 Units at 09/24/23 2149   LORazepam (ATIVAN) tablet 0.5 mg  0.5 mg Oral Once PRN Danford, Willis Harter, MD       ondansetron  (ZOFRAN ) tablet 4 mg  4 mg Oral Q6H PRN Danford, Willis Harter, MD       Or   ondansetron  (ZOFRAN ) injection 4 mg  4 mg Intravenous Q6H PRN Danford, Willis Harter, MD       pantoprazole (PROTONIX) EC tablet 40 mg  40 mg Oral Daily Singh, Prashant K, MD   40 mg at 09/25/23 0831   rosuvastatin  (CRESTOR ) tablet 20 mg  20 mg Oral Daily Danford, [name] P, MD   20 mg at 09/25/23 0831     Discharge Medications: Please see discharge summary for a list of discharge medications.  Relevant Imaging Results:  Relevant Lab Results:   Additional Information Patient receiving dialysis, SSN: [id number]  Hilda Lovings, LCSW

## 2023-09-25 NOTE — Plan of Care (Signed)

## 2023-09-25 NOTE — Progress Notes (Signed)
  [facility] Progress Note   Subjective:  Seen in room. No events overnight. Has questions about upcoming procedure.  No cp, sob  Objective Vitals:   09/24/23 2011 09/25/23 0005 09/25/23 0341 09/25/23 0740  BP: 134/63  (!) 170/72 (!) 139/58  Pulse:    87  Resp:    13  Temp: 98.2 F (36.8 C) 98.3 F (36.8 C) 98.4 F (36.9 C) 98.2 F (36.8 C)  TempSrc: Oral Oral Oral Oral  SpO2:    99%  Weight:      Height:         Additional Objective Labs: Basic Metabolic Panel: Recent Labs  Lab 09/23/23 1129 09/23/23 1133 09/24/23 0551  NA 137 136 137  K 3.5 3.4* 3.6  CL 96* 92* 98  CO2 30  --  29  GLUCOSE 121* 119* 92  BUN 12 14 21*  CREATININE 2.32* 2.50* 3.62*  CALCIUM  8.1*  --  8.5*   CBC: Recent Labs  Lab 09/23/23 1133 09/24/23 0627 09/24/23 1442 09/25/23 0601  WBC 6.6  --  5.1 5.6  NEUTROABS 5.1  --   --  3.6  HGB 5.7*  5.8*  --  8.0* 8.8*  HCT 17.5*  17.0*  --  23.7* 26.3*  MCV 91.6  --  85.6 86.5  PLT 107* 95* 106* 116*   Blood Culture No results found for: "SDES", "SPECREQUEST", "CULT", "REPTSTATUS"   Physical Exam General: Alert, nad Heart: RRR 2/6 SEM Lungs: Clear,normal wob Abdomen: soft, non-tender Extremities: No LE edema  Dialysis Access: RUE AVG +bruit   Medications:   amLODipine   10 mg Oral Daily   azaTHIOprine  50 mg Oral Daily   calcium  acetate  667 mg Oral TID WC   carvedilol   25 mg Oral BID WC   clopidogrel   75 mg Oral Daily   insulin  aspart  0-5 Units Subcutaneous QHS   insulin  aspart  0-6 Units Subcutaneous TID WC   insulin  glargine-yfgn  8 Units Subcutaneous QHS   pantoprazole  40 mg Oral Daily   rosuvastatin   20 mg Oral Daily    Dialysis Orders:  AF MWF 3:30 350/A1.5x  EDW 46kg 3K/2.5Ca AVG 16g x2 -Heparin  1500 bolus -Mircera 50 q 2 wks (last 5/5) -Venofer  100 x 5  -Hectorol  1 mcg TIW   Assessment/Plan: ABLA 2/2 bleeding from AV graft -- Subacute oozing from graft for > 1 month. Vascular surgery  consulted. Concern for central venous stenosis. Plan for shuntogram 5/12.  Hgb 5.7 on admit. Transfused 2 u prbcs on 5/9.  Follow CBC.  Stroke like symptoms - H/o CVA w residual R hemiparesis. No acute CVA on MRI. ESRD -  HD MWF. Continue on schedule.  Next HD Mon. No heparin   Hypertension - BP elevated on arrival. Home meds resumed. Improving.  Anemia  - As above. Received Mircera on 5/5.  Secondary HPTH  - Ca acceptable. Ca acetate binder  T2DM - Insulin  per primary  CAD s/p PCI   Elona Hal PA-C [facility] 09/25/2023,10:14 AM

## 2023-09-26 ENCOUNTER — Encounter (HOSPITAL_COMMUNITY)
Admission: EM | Disposition: A | Discharge status: Home Health | Payer: Self-pay | Source: Home / Self Care | Attending: Internal Medicine

## 2023-09-26 ENCOUNTER — Encounter (HOSPITAL_COMMUNITY): Payer: Self-pay | Admitting: Vascular Surgery

## 2023-09-26 DIAGNOSIS — D631 Anemia in chronic kidney disease: Secondary | ICD-10-CM | POA: Diagnosis present

## 2023-09-26 DIAGNOSIS — Z992 Dependence on renal dialysis: Secondary | ICD-10-CM | POA: Diagnosis not present

## 2023-09-26 DIAGNOSIS — D62 Acute posthemorrhagic anemia: Secondary | ICD-10-CM | POA: Diagnosis not present

## 2023-09-26 DIAGNOSIS — Z886 Allergy status to analgesic agent status: Secondary | ICD-10-CM | POA: Diagnosis not present

## 2023-09-26 DIAGNOSIS — Y841 Kidney dialysis as the cause of abnormal reaction of the patient, or of later complication, without mention of misadventure at the time of the procedure: Secondary | ICD-10-CM | POA: Diagnosis present

## 2023-09-26 DIAGNOSIS — H3090 Unspecified chorioretinal inflammation, unspecified eye: Secondary | ICD-10-CM | POA: Diagnosis not present

## 2023-09-26 DIAGNOSIS — E785 Hyperlipidemia, unspecified: Secondary | ICD-10-CM | POA: Diagnosis present

## 2023-09-26 DIAGNOSIS — T82838A Hemorrhage of vascular prosthetic devices, implants and grafts, initial encounter: Secondary | ICD-10-CM | POA: Diagnosis present

## 2023-09-26 DIAGNOSIS — I871 Compression of vein: Secondary | ICD-10-CM | POA: Diagnosis present

## 2023-09-26 DIAGNOSIS — Z794 Long term (current) use of insulin: Secondary | ICD-10-CM | POA: Diagnosis not present

## 2023-09-26 DIAGNOSIS — T82898A Other specified complication of vascular prosthetic devices, implants and grafts, initial encounter: Secondary | ICD-10-CM | POA: Diagnosis not present

## 2023-09-26 DIAGNOSIS — G35 Multiple sclerosis: Secondary | ICD-10-CM | POA: Diagnosis present

## 2023-09-26 DIAGNOSIS — I120 Hypertensive chronic kidney disease with stage 5 chronic kidney disease or end stage renal disease: Secondary | ICD-10-CM | POA: Diagnosis not present

## 2023-09-26 DIAGNOSIS — Z79899 Other long term (current) drug therapy: Secondary | ICD-10-CM | POA: Diagnosis not present

## 2023-09-26 DIAGNOSIS — I2510 Atherosclerotic heart disease of native coronary artery without angina pectoris: Secondary | ICD-10-CM | POA: Diagnosis present

## 2023-09-26 DIAGNOSIS — Z955 Presence of coronary angioplasty implant and graft: Secondary | ICD-10-CM | POA: Diagnosis not present

## 2023-09-26 DIAGNOSIS — I69320 Aphasia following cerebral infarction: Secondary | ICD-10-CM | POA: Diagnosis not present

## 2023-09-26 DIAGNOSIS — Z887 Allergy status to serum and vaccine status: Secondary | ICD-10-CM | POA: Diagnosis not present

## 2023-09-26 DIAGNOSIS — D649 Anemia, unspecified: Secondary | ICD-10-CM | POA: Diagnosis present

## 2023-09-26 DIAGNOSIS — Z885 Allergy status to narcotic agent status: Secondary | ICD-10-CM | POA: Diagnosis not present

## 2023-09-26 DIAGNOSIS — I69351 Hemiplegia and hemiparesis following cerebral infarction affecting right dominant side: Secondary | ICD-10-CM | POA: Diagnosis not present

## 2023-09-26 DIAGNOSIS — E1169 Type 2 diabetes mellitus with other specified complication: Secondary | ICD-10-CM | POA: Diagnosis present

## 2023-09-26 DIAGNOSIS — G8929 Other chronic pain: Secondary | ICD-10-CM | POA: Diagnosis present

## 2023-09-26 DIAGNOSIS — I252 Old myocardial infarction: Secondary | ICD-10-CM | POA: Diagnosis not present

## 2023-09-26 DIAGNOSIS — E1122 Type 2 diabetes mellitus with diabetic chronic kidney disease: Secondary | ICD-10-CM | POA: Diagnosis present

## 2023-09-26 DIAGNOSIS — N2581 Secondary hyperparathyroidism of renal origin: Secondary | ICD-10-CM | POA: Diagnosis not present

## 2023-09-26 DIAGNOSIS — Z7902 Long term (current) use of antithrombotics/antiplatelets: Secondary | ICD-10-CM | POA: Diagnosis not present

## 2023-09-26 DIAGNOSIS — N186 End stage renal disease: Secondary | ICD-10-CM | POA: Diagnosis not present

## 2023-09-26 HISTORY — PX: A/V SHUNTOGRAM: CATH118297

## 2023-09-26 LAB — CBC WITH DIFFERENTIAL/PLATELET
Abs Immature Granulocytes: 0.02 10*3/uL (ref 0.00–0.07)
Basophils Absolute: 0 10*3/uL (ref 0.0–0.1)
Basophils Relative: 0 %
Eosinophils Absolute: 0.2 10*3/uL (ref 0.0–0.5)
Eosinophils Relative: 4 %
HCT: 24.1 % — ABNORMAL LOW (ref 36.0–46.0)
Hemoglobin: 8 g/dL — ABNORMAL LOW (ref 12.0–15.0)
Immature Granulocytes: 1 %
Lymphocytes Relative: 24 %
Lymphs Abs: 0.9 10*3/uL (ref 0.7–4.0)
MCH: 29 pg (ref 26.0–34.0)
MCHC: 33.2 g/dL (ref 30.0–36.0)
MCV: 87.3 fL (ref 80.0–100.0)
Monocytes Absolute: 0.3 10*3/uL (ref 0.1–1.0)
Monocytes Relative: 6 %
Neutro Abs: 2.5 10*3/uL (ref 1.7–7.7)
Neutrophils Relative %: 65 %
Platelets: 89 10*3/uL — ABNORMAL LOW (ref 150–400)
RBC: 2.76 MIL/uL — ABNORMAL LOW (ref 3.87–5.11)
RDW: 18.6 % — ABNORMAL HIGH (ref 11.5–15.5)
WBC: 3.9 10*3/uL — ABNORMAL LOW (ref 4.0–10.5)
nRBC: 0 % (ref 0.0–0.2)

## 2023-09-26 LAB — GLUCOSE, CAPILLARY
Glucose-Capillary: 205 mg/dL — ABNORMAL HIGH (ref 70–99)
Glucose-Capillary: 144 mg/dL — ABNORMAL HIGH (ref 70–99)
Glucose-Capillary: 103 mg/dL — ABNORMAL HIGH (ref 70–99)
Glucose-Capillary: 170 mg/dL — ABNORMAL HIGH (ref 70–99)

## 2023-09-26 LAB — HEPATITIS B SURFACE ANTIBODY, QUANTITATIVE: Hep B S AB Quant (Post): 57 m[IU]/mL

## 2023-09-26 SURGERY — A/V SHUNTOGRAM
Anesthesia: LOCAL | Laterality: Right

## 2023-09-26 MED ORDER — LIDOCAINE HCL (PF) 1 % IJ SOLN
INTRAMUSCULAR | Status: DC | PRN
  Administered 2023-09-26: 10 mL

## 2023-09-26 MED ORDER — HEPARIN (PORCINE) IN NACL 1000-0.9 UT/500ML-% IV SOLN
INTRAVENOUS | Status: DC | PRN
Start: 2023-09-26 — End: 2023-09-26
  Administered 2023-09-26: 500 mL

## 2023-09-26 MED ORDER — IODIXANOL 320 MG/ML IV SOLN
INTRAVENOUS | Status: DC | PRN
  Administered 2023-09-26: 35 mL

## 2023-09-26 SURGICAL SUPPLY — 13 items
BAG SNAP BAND KOVER 36X36 (MISCELLANEOUS) ×1 IMPLANT
COVER DOME SNAP 22 D (MISCELLANEOUS) ×1 IMPLANT
GUIDEWIRE ANGLED .035X260CM (WIRE) IMPLANT
KIT ENCORE 26 ADVANTAGE (KITS) IMPLANT
KIT MICROPUNCTURE NIT STIFF (SHEATH) IMPLANT
SHEATH PINNACLE 7F 10CM (SHEATH) IMPLANT
SHEATH PINNACLE R/O II 7F 4CM (SHEATH) IMPLANT
SHEATH PROBE COVER 6X72 (BAG) ×1 IMPLANT
STATION PROTECTION PRESSURIZED (MISCELLANEOUS) ×1 IMPLANT
STENT EXPRESS LD 10X25X135 (Permanent Stent) IMPLANT
STOPCOCK MORSE 400PSI 3WAY (MISCELLANEOUS) ×1 IMPLANT
TRAY PV CATH (CUSTOM PROCEDURE TRAY) ×1 IMPLANT
TUBING CIL FLEX 10 FLL-RA (TUBING) ×1 IMPLANT

## 2023-09-26 NOTE — Op Note (Signed)
    Patient name: Hannah [name] MRN: [id number] DOB: [birth year] Sex: female  09/26/2023 Pre-operative Diagnosis: ESRD on HD Post-operative diagnosis:  Same Surgeon:  Philipp Brawn, MD Procedure Performed:  Ultrasound-guided access of right upper extremity graft Fistulogram and central venogram Stent of right innominate vein with 10 mm x 25 mm LD express  Indications: [name] is a 58 year old female who was admitted with symptomatic anemia.  She has been having prolonged bleeding after dialysis sessions and has had multiple interventions on the right arm graft.  Most recently an innominate stenosis was balloon angioplastied.  The graft was noted to be pulsatile on exam.  There are some benefits of fistulogram with intervention and possible stenting were reviewed.  She expressed understanding and elected to proceed.  Findings:  Widely patent right arm graft, patent outflow vein stents with mild approximate 30% stenosis just central to the stents.  Axillary and subclavian veins are widely patent.  The right innominate vein has significant stenosis approximately 70%.   Procedure:  The patient was identified in the holding area and taken to the cath lab  The patient was then placed supine on the table and prepped and draped in the usual sterile fashion.  A time out was called.  Ultrasound was used to evaluate the right arm AV access. This was accessed under u/s guidance. An 018 wire was advanced without resistance, a micropuncture sheath was placed and fistulagram obtained which demonstrated the above findings.  This access was then upsized to a 6 F short sheath over a glidewire.  The innominate vein stenosis was treated with a 10 mm x 25 mm LD express stent.  Completion angiogram demonstrated wide patency with minimal residual stenosis and brisk flow.   Philipp Brawn MD [facility] Office: [phone]

## 2023-09-26 NOTE — Evaluation (Signed)
 Occupational Therapy Evaluation Patient Details Name: Hannah [name] MRN: [id number] DOB: 1965-12-19 Today's Date: 09/26/2023   History of Present Illness   58 y.o. F who presented 5/9 with worsening of her right hemiparesis and dysarthria after dialysis. Symptomatic anemia due to intermittent right upper extremity AV fistula site bleeding. Stroke-like symptoms appears to be recrudescence of old stroke symptoms due to anemia. PMH: ESRD on HD, HTN, CAD s/p remote PCI, retinitis on azathioprine, DM, stroke with residual R hemiparesis and dysarthria, chronic pain.     Clinical Impressions PTA, pt lives with daughter, typically ambulatory with RW in the home but receives intermittent assist with this. Pt's family have been assisting with bathing and dressing. Pt presents now with deficits in strength, standing balance and endurance w/ baseline R sided hemiparesis. Pt requires Min A for bed mobility, Mod A to stand with RW but unable to progress gait due to posterior bias. Introduced [place] w/ pt able to stand with Min A and perform UB ADLs standing in [place] at sink. Pt requires Min A for UB ADL and Max A for LB ADLs. Pt's daughter present and both interested in postacute rehab to improve transfer and ADL independence.      If plan is discharge home, recommend the following:   A lot of help with walking and/or transfers;Two people to help with walking and/or transfers;A lot of help with bathing/dressing/bathroom     Functional Status Assessment   Patient has had a recent decline in their functional status and demonstrates the ability to make significant improvements in function in a reasonable and predictable amount of time.     Equipment Recommendations   Other (comment) (TBD pending progress)     Recommendations for Other Services         Precautions/Restrictions   Precautions Precautions: Fall Recall of Precautions/Restrictions: Intact Restrictions Weight Bearing  Restrictions Per Provider Order: No     Mobility Bed Mobility Overal bed mobility: Needs Assistance Bed Mobility: Supine to Sit, Sit to Supine     Supine to sit: Min assist, HOB elevated Sit to supine: Supervision   General bed mobility comments: Min A to lift trunk with handheld assist, able to get BLE back into bed without assist    Transfers Overall transfer level: Needs assistance Equipment used: Rolling walker (2 wheels), Ambulation equipment used Transfers: Sit to/from Stand Sit to Stand: Min assist, Mod assist           General transfer comment: Min A to stand in [place], [place] A to stand with RW w/ noted posterior bias and difficulty shifting weight forward      Balance Overall balance assessment: Needs assistance Sitting-balance support: Single extremity supported, Feet supported Sitting balance-Leahy Scale: Fair   Postural control: Posterior lean Standing balance support: Bilateral upper extremity supported, Reliant on assistive device for balance Standing balance-Leahy Scale: Poor                             ADL either performed or assessed with clinical judgement   ADL Overall ADL's : Needs assistance/impaired Eating/Feeding: Set up   Grooming: Minimal assistance;Standing;Oral care Grooming Details (indicate cue type and reason): standing in [place] at sink, assist to open toothpaste and place on toothbrush due to incoordination. able to stand from [place] pads to rinse/spit without assist Upper Body Bathing: Minimal assistance   Lower Body Bathing: Sitting/lateral leans;Sit to/from stand;Maximal assistance   Upper Body Dressing : Minimal assistance;Sitting  Lower Body Dressing: Sit to/from stand;Sitting/lateral leans;Maximal assistance       Toileting- Clothing Manipulation and Hygiene: Moderate assistance;Sitting/lateral lean;Sit to/from stand               Vision Ability to See in Adequate Light: 1 Impaired Patient Visual Report: No  change from baseline Vision Assessment?: No apparent visual deficits     Perception         Praxis         Pertinent Vitals/Pain Pain Assessment Pain Assessment: No/denies pain     Extremity/Trunk Assessment Upper Extremity Assessment Upper Extremity Assessment: Generalized weakness;Right hand dominant;RUE deficits/detail RUE Deficits / Details: hx of R hemiparesis from prior CVA, also HD access in this UE. able to reach to [facility] bar, hold items with hand but incoordination noted RUE Coordination: decreased fine motor;decreased gross motor   Lower Extremity Assessment Lower Extremity Assessment: Defer to PT evaluation   Cervical / Trunk Assessment Cervical / Trunk Assessment: Normal   Communication Communication Communication: Impaired Factors Affecting Communication: Reduced clarity of speech   Cognition Arousal: Alert Behavior During Therapy: WFL for tasks assessed/performed Cognition: History of cognitive impairments             OT - Cognition Comments: hx of CVA w/ slower processing/delayed motor planning but overall functional. minor cues for problem solving                 Following commands: Intact       Cueing  General Comments   Cueing Techniques: Verbal cues  Daughter at bedside, discussed pt/family goals and rehab desires   Exercises     Shoulder Instructions      Home Living Family/patient expects to be discharged to:: Private residence Living Arrangements: Children Available Help at Discharge: Family;Available 24 hours/day Type of Home: Apartment Home Access: Stairs to enter Entrance Stairs-Number of Steps: 1 Entrance Stairs-Rails: None Home Layout: One level     Bathroom Shower/Tub: Chief Strategy Officer: Standard     Home Equipment: Tub bench;Rolling Environmental consultant (2 wheels);BSC/3in1;Wheelchair - manual;Cane - single point          Prior Functioning/Environment Prior Level of Function : Needs assist              Mobility Comments: Walk short distances with RW if she has help. Uses W/c outside primarily. Can push w/c on her own mostly. ADLs Comments: family assisting with bathing and dressing but daughter feels like pt has potential to be able to do more. family assists with IADLs    OT Problem List: Decreased strength;Decreased activity tolerance;Impaired balance (sitting and/or standing);Decreased cognition;Decreased knowledge of use of DME or AE;Impaired UE functional use;Decreased coordination   OT Treatment/Interventions: Therapeutic exercise;Self-care/ADL training;Energy conservation;DME and/or AE instruction;Therapeutic activities;Patient/family education;Balance training      OT Goals(Current goals can be found in the care plan section)   Acute Rehab OT Goals Patient Stated Goal: be able to transfer without assist, be able to do more bathing and dressing without assist OT Goal Formulation: With patient/family Time For Goal Achievement: 10/10/23 Potential to Achieve Goals: Good ADL Goals Pt Will Perform Lower Body Bathing: with min assist;sitting/lateral leans;sit to/from stand Pt Will Perform Lower Body Dressing: with min assist;sitting/lateral leans;sit to/from stand Pt Will Transfer to Toilet: with contact guard assist;stand pivot transfer;bedside commode Pt/caregiver will Perform Home Exercise Program: Both right and left upper extremity;Increased strength;With theraband;With Supervision;With written HEP provided   OT Frequency:  Min 2X/week    Co-evaluation  AM-PAC OT "6 Clicks" Daily Activity     Outcome Measure Help from another person eating meals?: A Little Help from another person taking care of personal grooming?: A Little Help from another person toileting, which includes using toliet, bedpan, or urinal?: A Lot Help from another person bathing (including washing, rinsing, drying)?: A Lot Help from another person to put on and taking off regular  upper body clothing?: A Little Help from another person to put on and taking off regular lower body clothing?: A Lot 6 Click Score: 15   End of Session Equipment Utilized During Treatment: Rolling walker (2 wheels) Nurse Communication: Mobility status  Activity Tolerance: Patient tolerated treatment well Patient left: in bed;with call bell/phone within reach;with bed alarm set;with family/visitor present  OT Visit Diagnosis: Unsteadiness on feet (R26.81);Other abnormalities of gait and mobility (R26.89);Muscle weakness (generalized) (M62.81)                Time: 1610-9604 OT Time Calculation (min): 30 min Charges:  OT General Charges $OT Visit: 1 Visit OT Evaluation $OT Eval Moderate Complexity: 1 Mod OT Treatments $Self Care/Home Management : 8-22 mins  Lawrence Pretty, OTR/L Acute Rehab Services Office: [phone]   Annabella Barr 09/26/2023, 8:51 AM

## 2023-09-26 NOTE — Plan of Care (Signed)

## 2023-09-26 NOTE — Progress Notes (Addendum)
 [facility] Progress Note   Subjective:  Patient seen in room this am. She reports that she is hungry and thirsty, but is NPO due to her having VVS ultrasound guided access of right upper extremity graft.   Objective Vitals:   09/26/23 0000 09/26/23 0835 09/26/23 1130 09/26/23 1223  BP: (!) 159/63 (!) 169/62 (!) 170/58   Pulse: 88 80 84   Resp: 19 12 13    Temp: 98.1 F (36.7 C) 98.2 F (36.8 C) 98.5 F (36.9 C)   TempSrc: Oral Oral Oral   SpO2: 98% 100% 100% 100%  Weight:      Height:         Additional Objective Labs: Basic Metabolic Panel: Recent Labs  Lab 09/23/23 1129 09/23/23 1133 09/24/23 0551  NA 137 136 137  K 3.5 3.4* 3.6  CL 96* 92* 98  CO2 30  --  29  GLUCOSE 121* 119* 92  BUN 12 14 21*  CREATININE 2.32* 2.50* 3.62*  CALCIUM  8.1*  --  8.5*   CBC: Recent Labs  Lab 09/23/23 1133 09/24/23 0627 09/24/23 1442 09/25/23 0601 09/26/23 0429  WBC 6.6  --  5.1 5.6 3.9*  NEUTROABS 5.1  --   --  3.6 2.5  HGB 5.7*  5.8*  --  8.0* 8.8* 8.0*  HCT 17.5*  17.0*  --  23.7* 26.3* 24.1*  MCV 91.6  --  85.6 86.5 87.3  PLT 107*   < > 106* 116* 89*   < > = values in this interval not displayed.   Blood Culture No results found for: "SDES", "SPECREQUEST", "CULT", "REPTSTATUS"   Physical Exam General: Alert, nad Heart: RRR 2/6 SEM Lungs: Clear,normal wob Abdomen: soft, non-tender Extremities: No LE edema  Dialysis Access: RUE AVG +bruit   Medications:   [MAR Hold] amLODipine   10 mg Oral Daily   [MAR Hold] azaTHIOprine  50 mg Oral Daily   [MAR Hold] calcium  acetate  667 mg Oral TID WC   [MAR Hold] carvedilol   25 mg Oral BID WC   [MAR Hold] Chlorhexidine  Gluconate Cloth  6 each Topical Q0600   [MAR Hold] clopidogrel   75 mg Oral Daily   [MAR Hold] feeding supplement (NEPRO CARB STEADY)  237 mL Oral BID BM   [MAR Hold] insulin  aspart  0-5 Units Subcutaneous QHS   [MAR Hold] insulin  aspart  0-6 Units Subcutaneous TID WC   [MAR Hold] insulin   glargine-yfgn  8 Units Subcutaneous QHS   [MAR Hold] pantoprazole  40 mg Oral Daily   [MAR Hold] rosuvastatin   20 mg Oral Daily    Dialysis Orders:  AF MWF 3:30 350/A1.5x  EDW 46kg 3K/2.5Ca AVG 16g x2 -Heparin  1500 bolus -Mircera 50 q 2 wks (last 5/5) -Venofer  100 x 5  -Hectorol  1 mcg TIW   Assessment/Plan: ABLA 2/2 bleeding from AV graft -- Subacute oozing from graft for > 1 month. Vascular did fistulogram and central venogram performed 09/26/23. Angiogram demonstrated wide patency with minimal residual stenosis and brisk flow.  Hgb 5.7 on admit. Transfused 2 u prbcs on 5/9.  Follow CBC.  Stroke like symptoms - H/o CVA w residual R hemiparesis. No acute CVA on MRI. ESRD -  HD MWF. Continue on schedule.  Next HD Mon. No heparin   Hypertension - BP elevated on arrival. Home meds resumed. Improving.  Anemia  - As above. Received Mircera on 5/5.  Secondary HPTH  - Ca acceptable. Ca acetate binder  T2DM - Insulin  per primary  CAD s/p  PCI   Hannah Lorenzo, NP [facility] 09/26/2023,1:23 PM  Pt seen, examined and agree w A/P as above.  Larry Poag MD  [facility] 09/26/2023, 4:36 PM

## 2023-09-26 NOTE — Progress Notes (Signed)
 Pt receives out-pt HD at [facility] [place] on MWF 7:10 am chair time. Will assist as needed.   Lauraine Polite Renal Navigator [phone]

## 2023-09-26 NOTE — [place] FL2 (Signed)
 [place]  MEDICAID [id number] LEVEL OF CARE FORM     IDENTIFICATION  Patient Name: Hannah [name] Birthdate: 11/14/1965 Sex: female Admission Date (Current Location): 09/23/2023  [facility] and [place] Number:  Producer, television/film/video and Address:  [facility], [address], [place], [place] [address]      Provider Number: [id number]  Attending Physician Name and Address:  Cala Castleman, MD  Relative Name and Phone Number:  Daughter: Rosezell, Lovel / [phone]  and  Daughter: Georgianna Kirschner / [phone]    Current Level of Care: Hospital Recommended Level of Care: Skilled Nursing Facility Prior Approval Number:    Date Approved/Denied: 09/25/23 PASRR Number: [id number]  Discharge Plan: SNF    Current Diagnoses: Patient Active Problem List   Diagnosis Date Noted   Symptomatic anemia 09/26/2023   Anemia 09/23/2023   Chorioretinitis 09/23/2023   Dependent edema 06/09/2022   Long term (current) use of insulin  (HCC) 01/14/2022   Chronic constipation 06/09/2021   Altered mental status, unspecified 01/12/2021   Other chronic postprocedural pain 01/09/2021   Spastic hemiplegia due to cerebrovascular disease (HCC) 11/03/2020   Debility 10/18/2020   ESRD on hemodialysis (HCC)    SOB (shortness of breath) 09/27/2020   Volume overload 09/26/2020   CKD (chronic kidney disease) stage 5, GFR less than 15 ml/min (HCC) 09/13/2020   H/O: CVA (cerebrovascular accident) 09/12/2020   Chronic kidney disease (CKD), stage IV (severe) (HCC)    Type 2 diabetes mellitus (HCC)    Essential hypertension    Right pontine cerebrovascular accident (HCC) 06/12/2020   Stroke-like symptoms 06/09/2020   Acute kidney injury superimposed on CKD (HCC) 06/09/2020   Cerebral thrombosis with cerebral infarction 06/09/2020   Hyperlipidemia associated with type 2 diabetes mellitus (HCC)    Bimalleolar ankle fracture 11/24/2018   Lower abdominal pain 07/28/2017   Bladder mass  07/18/2017   Painless hematuria 07/18/2017   Intractable vomiting with nausea 07/12/2017   Hemiparesis affecting right side as late effect of cerebrovascular accident (CVA) (HCC) 09/22/2016   AKI (acute kidney injury) (HCC) 01/08/2016   Multiple sclerosis (HCC) 2009   CAD S/P PCI x 2 2009   History of myocardial infarction due to atherothrombotic coronary artery disease 2009    Orientation RESPIRATION BLADDER Height & Weight     Self, Time, Situation, Place  Normal (Room Air) Incontinent Weight: 108 lb 0.4 oz (49 kg) Height:  5\' 2"  (157.5 cm)  BEHAVIORAL SYMPTOMS/MOOD NEUROLOGICAL BOWEL NUTRITION STATUS      Continent Diet (Please see discharge summary)  AMBULATORY STATUS COMMUNICATION OF NEEDS Skin   Extensive Assist Verbally Normal                       Personal Care Assistance Level of Assistance  Bathing, Dressing, Feeding Bathing Assistance: Maximum assistance Feeding assistance: Maximum assistance Dressing Assistance: Maximum assistance     Functional Limitations Info  Sight Sight Info: Impaired (R and L) Hearing Info: Adequate      SPECIAL CARE FACTORS FREQUENCY  PT (By licensed PT), OT (By licensed OT)     PT Frequency: 5x OT Frequency: 5x            Contractures Contractures Info: Not present    Additional Factors Info  Code Status, Allergies, Insulin  Sliding Scale Code Status Info: Full Code Allergies Info: Ibuprofen, Influenza Vaccines, Pneumococcal Vaccine, Promethazine, Ambien  (Zolpidem ), Codeine, Cyclobenzaprine, Hydrocodone -acetaminophen , Tape, Tramadol  Hcl, Wound Dressing Adhesive, Oxycodone    Insulin  Sliding Scale Info: Please  see discharge summary       Current Medications (09/26/2023):  This is the current hospital active medication list Current Facility-Administered Medications  Medication Dose Route Frequency Provider Last Rate Last Admin   [MAR Hold] acetaminophen  (TYLENOL ) tablet 650 mg  650 mg Oral Q6H PRN Danford, Willis Harter,  MD       Or   Evette Hoes Hold] acetaminophen  (TYLENOL ) suppository 650 mg  650 mg Rectal Q6H PRN Danford, Willis Harter, MD       [MAR Hold] amLODipine  (NORVASC ) tablet 10 mg  10 mg Oral Daily Ephriam Hashimoto, MD   10 mg at 09/25/23 0831   [MAR Hold] azaTHIOprine (IMURAN) tablet 50 mg  50 mg Oral Daily Danford, [name] P, MD   50 mg at 09/25/23 0840   [MAR Hold] calcium  acetate (PHOSLO ) capsule 667 mg  667 mg Oral TID WC Ejigiri, Ogechi Grace, PA-C   667 mg at 09/25/23 1837   [MAR Hold] carvedilol  (COREG ) tablet 25 mg  25 mg Oral BID WC Danford, Willis Harter, MD   25 mg at 09/25/23 1641   [MAR Hold] Chlorhexidine  Gluconate Cloth 2 % PADS 6 each  6 each Topical Q0600 Elona Hal, PA-C       [MAR Hold] clopidogrel  (PLAVIX ) tablet 75 mg  75 mg Oral Daily Danford, Willis Harter, MD   75 mg at 09/25/23 0831   [MAR Hold] feeding supplement (NEPRO CARB STEADY) liquid 237 mL  237 mL Oral BID BM Singh, Prashant K, MD       Heparin  (Porcine) in NaCl 1000-0.9 UT/500ML-% SOLN    PRN Philipp Brawn, MD   500 mL at 09/26/23 1223   [MAR Hold] hydrALAZINE  (APRESOLINE ) injection 10 mg  10 mg Intravenous Q8H PRN Singh, Prashant K, MD   10 mg at 09/24/23 1004   [MAR Hold] insulin  aspart (novoLOG ) injection 0-5 Units  0-5 Units Subcutaneous QHS Ephriam Hashimoto, MD   2 Units at 09/24/23 2149   [facility] Hold] insulin  aspart (novoLOG ) injection 0-6 Units  0-6 Units Subcutaneous TID WC Ephriam Hashimoto, MD   2 Units at 09/25/23 1641   [MAR Hold] insulin  glargine-yfgn (SEMGLEE ) injection 8 Units  8 Units Subcutaneous QHS Ephriam Hashimoto, MD   8 Units at 09/25/23 2158   lidocaine  (PF) (XYLOCAINE ) 1 % injection    PRN Philipp Brawn, MD   10 mL at 09/26/23 1229   [MAR Hold] LORazepam (ATIVAN) tablet 0.5 mg  0.5 mg Oral Once PRN Danford, Willis Harter, MD       [MAR Hold] ondansetron  (ZOFRAN ) tablet 4 mg  4 mg Oral Q6H PRN Danford, Willis Harter, MD       Or   Evette Hoes Hold] ondansetron   (ZOFRAN ) injection 4 mg  4 mg Intravenous Q6H PRN Danford, Willis Harter, MD       [MAR Hold] pantoprazole (PROTONIX) EC tablet 40 mg  40 mg Oral Daily Singh, Prashant K, MD   40 mg at 09/25/23 0831   [MAR Hold] rosuvastatin  (CRESTOR ) tablet 20 mg  20 mg Oral Daily Danford, [name] P, MD   20 mg at 09/25/23 0831     Discharge Medications: Please see discharge summary for a list of discharge medications.  Relevant Imaging Results:  Relevant Lab Results:   Additional Information Patient receiving dialysis [facility] on MWF 7:10 am chair time, SSN: [id number]  Juliane Och, LCSW

## 2023-09-26 NOTE — Progress Notes (Signed)
 PROGRESS NOTE                                                                                                                                                                                                             Patient Demographics:    Hannah [name], is a 58 y.o. female, DOB - 1965-05-25, ZOX:[id number]  Outpatient Primary MD for the patient is Arlys Berke, MD    LOS - 0  Admit date - 09/23/2023    Chief Complaint  Patient presents with   Code Stroke       Brief Narrative (HPI from H&P)   58 y.o. F with ESRD on HD, HTN, CAD s/p remote PCI, retinitis on azathioprine, DM, stroke with residual R hemiparesis and dysarthria, chronic pain who presented with worsening of her right hemiparesis and dysarthria after dialysis.  NIHSS 5.  In the ER workup was negative for stroke eventually CT and MRI nonacute, her blood work in the ER suggested her hemoglobin was 5.7, patient apparently has been having a lot of issues with her right arm fistula bleed after every dialysis session related to her central stenosis.  She was admitted for further care.    Subjective:   Patient in bed, appears comfortable, denies any headache, no fever, no chest pain or pressure, no shortness of breath , no abdominal pain. No new focal weakness.   Assessment  & Plan :   Symptomatic anemia due to intermittent right upper extremity AV fistula site bleeding which has been subacute to chronic. She has no history suggestive of GI bleed, DIC panel negative, has received 2 units of packed RBC transfusion on 09/23/2023, posttransfusion clinically back to baseline, recheck CBC, no signs of ongoing bleeding.  Nephrology on board along with vascular surgery, vascular surgery contemplating right arm shuntogram with a possible shunt placement to relieve central stenosis on 09/26/2023.  Continue to monitor CBC.  Stroke-like symptoms This appears to be recrudescence of  old stroke symptoms due to anemia. Neurology evaluated, feel new acute stroke is unlikely, MRI negative, continue Plavix  and Crestor  along with supportive care.  Close to her baseline now has chronic right-sided hemiparesis from previous stroke.  Chorioretinitis - Continue azathioprine  CAD S/P PCI x 2  - Continue Plavix , Crestor , carvedilol   ESRD on hemodialysis [facility])  - Consult nephrology MWF schedule.  Essential hypertension Blood pressure elevated, continue amlodipine , carvedilol , as needed hydralazine  added  Hyperlipidemia associated with type 2 diabetes mellitus (HCC)  - Continue Crestor   Type 2 diabetes mellitus (HCC)  - Continue Tresiba , - Sliding scale corrections  Lab Results  Component Value Date   HGBA1C 7.5 (H) 04/01/2022   CBG (last 3)  Recent Labs    09/25/23 1535 09/25/23 2130 09/26/23 0359  GLUCAP 241* 208* 205*         Condition - Extremely Guarded  Family Communication  :  daughter updated on 09/24/23 family member bedside on 09/25/2023  Code Status :  Full  Consults  :  VVS, renal  PUD Prophylaxis : PPI   Procedures  :      MRI - 1. Motion degraded exam. 2. No acute intracranial abnormality. 3. Moderate to advanced chronic microvascular ischemic disease with a few scattered remote lacunar infarcts as above.      Disposition Plan  :    Status is: Observation   DVT Prophylaxis  :    SCDs Start: 09/23/23 1938    Lab Results  Component Value Date   PLT 89 (L) 09/26/2023    Diet :  Diet Order             Diet NPO time specified  Diet effective midnight                    Inpatient Medications  Scheduled Meds:  amLODipine   10 mg Oral Daily   azaTHIOprine  50 mg Oral Daily   calcium  acetate  667 mg Oral TID WC   carvedilol   25 mg Oral BID WC   Chlorhexidine  Gluconate Cloth  6 each Topical Q0600   clopidogrel   75 mg Oral Daily   feeding supplement (NEPRO CARB STEADY)  237 mL Oral BID BM   insulin  aspart  0-5 Units  Subcutaneous QHS   insulin  aspart  0-6 Units Subcutaneous TID WC   insulin  glargine-yfgn  8 Units Subcutaneous QHS   pantoprazole  40 mg Oral Daily   rosuvastatin   20 mg Oral Daily   Continuous Infusions: PRN Meds:.acetaminophen  **OR** acetaminophen , hydrALAZINE , LORazepam, ondansetron  **OR** ondansetron  (ZOFRAN ) IV  Antibiotics  :    Anti-infectives (From admission, onward)    None         Objective:   Vitals:   09/25/23 1126 09/25/23 1535 09/25/23 2000 09/26/23 0000  BP: (!) 156/66 138/60 (!) 156/62 (!) 159/63  Pulse: 83 84 85 88  Resp: 11 13 (!) 8 19  Temp: 98 F (36.7 C) 98.3 F (36.8 C)  98.1 F (36.7 C)  TempSrc: Oral Oral  Oral  SpO2: 100% 100% 99% 98%  Weight:      Height:        Wt Readings from Last 3 Encounters:  09/23/23 49 kg  09/04/23 46.8 kg  04/29/23 46.7 kg     Intake/Output Summary (Last 24 hours) at 09/26/2023 0743 Last data filed at 09/25/2023 1649 Gross per 24 hour  Intake 358 ml  Output 150 ml  Net 208 ml     Physical Exam  Awake Alert, No new F.N deficits, R.Hemiparesis, right arm AV fistula/shunt site stable with no bleeding [place].AT,PERRAL Supple Neck, No JVD,   Symmetrical Chest wall movement, Good air movement bilaterally, CTAB RRR,No Gallops,Rubs or new Murmurs,  +ve B.Sounds, Abd Soft, No tenderness,   No Cyanosis, Clubbing or edema       Data Review:    Recent  Labs  Lab 09/23/23 1133 09/24/23 0627 09/24/23 1442 09/25/23 0601 09/26/23 0429  WBC 6.6  --  5.1 5.6 3.9*  HGB 5.7*  5.8*  --  8.0* 8.8* 8.0*  HCT 17.5*  17.0*  --  23.7* 26.3* 24.1*  PLT 107* 95* 106* 116* 89*  MCV 91.6  --  85.6 86.5 87.3  MCH 29.8  --  28.9 28.9 29.0  MCHC 32.6  --  33.8 33.5 33.2  RDW 14.8  --  18.9* 18.9* 18.6*  LYMPHSABS 0.9  --   --  1.4 0.9  MONOABS 0.3  --   --  0.3 0.3  EOSABS 0.2  --   --  0.2 0.2  BASOSABS 0.0  --   --  0.0 0.0    Recent Labs  Lab 09/23/23 1129 09/23/23 1133 09/24/23 0551 09/24/23 0627  NA 137  136 137  --   K 3.5 3.4* 3.6  --   CL 96* 92* 98  --   CO2 30  --  29  --   ANIONGAP 11  --  10  --   GLUCOSE 121* 119* 92  --   BUN 12 14 21*  --   CREATININE 2.32* 2.50* 3.62*  --   AST 15  --   --   --   ALT 6  --   --   --   ALKPHOS 40  --   --   --   BILITOT 0.6  --   --   --   ALBUMIN 3.1*  --   --   --   DDIMER  --   --   --  0.28  INR 1.1  --   --  1.1  CALCIUM  8.1*  --  8.5*  --       Recent Labs  Lab 09/23/23 1129 09/24/23 0551 09/24/23 0627  DDIMER  --   --  0.28  INR 1.1  --  1.1  CALCIUM  8.1* 8.5*  --     --------------------------------------------------------------------------------------------------------------- Lab Results  Component Value Date   CHOL 133 04/01/2022   HDL 80 04/01/2022   LDLCALC 41 04/01/2022   TRIG 52 04/01/2022   CHOLHDL 1.7 04/01/2022    Lab Results  Component Value Date   HGBA1C 7.5 (H) 04/01/2022   No results for input(s): "TSH", "T4TOTAL", "FREET4", "T3FREE", "THYROIDAB" in the last 72 hours. Recent Labs    09/24/23 0551  VITAMINB12 362  FOLATE 7.1  FERRITIN 909*  TIBC 164*  IRON 89  RETICCTPCT 3.3*   ------------------------------------------------------------------------------------------------------------------ Cardiac Enzymes No results for input(s): "CKMB", "TROPONINI", "MYOGLOBIN" in the last 168 hours.  Invalid input(s): "CK"  Micro Results No results found for this or any previous visit (from the past 240 hours).  Radiology Report No results found.    Signature  -   Lynnwood Sauer M.D on 09/26/2023 at 7:43 AM   -  To page go to [email or web address]

## 2023-09-26 NOTE — Progress Notes (Signed)
   09/26/23 1845  Vitals  Temp 98.6 F (37 C)  Pulse Rate 90  Resp 19  BP (!) 181/67  SpO2 100 %  O2 Device Room Air  Weight 46.7 kg  Type of Weight Post-Dialysis  Oxygen Therapy  Patient Activity (if Appropriate) In bed  Pulse Oximetry Type Continuous  Oximetry Probe Site Changed No  Post Treatment  Dialyzer Clearance Lightly streaked  Hemodialysis Intake (mL) 0 mL  Liters Processed 84  Fluid Removed (mL) 1.6 mL  Tolerated HD Treatment Yes  Post-Hemodialysis Comments Pt able to remove 1.6L during treatment. Original order for 2L but pt complained of stomach cramping and UF was turned off at last third of treatment.  AVG/AVF Arterial Site Held (minutes) 7 minutes  AVG/AVF Venous Site Held (minutes) 5 minutes   Received patient in bed to unit.  Alert and oriented.  Informed consent signed and in chart.   TX duration: Three hours and thirty minutes  Patient tolerated well.  Transported back to the room  Alert, without acute distress.  Hand-off given to patient's nurse.   Access used: Right upper arm fistula Access issues: none

## 2023-09-26 NOTE — TOC Progression Note (Signed)
 Transition of Care [facility]) - Progression Note    Patient Details  Name: Hannah [name] MRN: [id number] Date of Birth: Jan 24, 1966  [facility]) CM/SW Contact  Juliane Och, LCSW Phone Number: 09/26/2023, 12:45 PM  Clinical Narrative:     12:45 PM FL2 was not accurately completed or sent out. CSW sent accurate fl2 to SNFs within [facility].  Expected Discharge Plan: Skilled Nursing Facility Barriers to Discharge: Continued Medical Work up  Expected Discharge Plan and Services In-house Referral: Clinical Social Work   Post Acute Care Choice: Skilled Nursing Facility Living arrangements for the past 2 months: Single Family Home                                       Social Determinants of Health (SDOH) Interventions SDOH Screenings   Food Insecurity: No Food Insecurity (09/23/2023)  Housing: Low Risk  (09/23/2023)  Transportation Needs: No Transportation Needs (09/23/2023)  Utilities: Not At Risk (09/23/2023)  Depression (PHQ2-9): Low Risk  (01/09/2021)  Tobacco Use: Low Risk  (09/23/2023)    Readmission Risk Interventions     No data to display

## 2023-09-26 NOTE — Progress Notes (Addendum)
  Progress Note    09/26/2023 7:27 AM * No surgery found *  Subjective:  no complaints    Vitals:   09/25/23 2000 09/26/23 0000  BP: (!) 156/62 (!) 159/63  Pulse: 85 88  Resp: (!) 8 19  Temp:  98.1 F (36.7 C)  SpO2: 99% 98%    Physical Exam: General:  resting comfortably, A&O x3 Lungs:  nonlabored Extremities:  pulsatile right upper arm AVG  CBC    Component Value Date/Time   WBC 3.9 (L) 09/26/2023 0429   RBC 2.76 (L) 09/26/2023 0429   HGB 8.0 (L) 09/26/2023 0429   HCT 24.1 (L) 09/26/2023 0429   PLT 89 (L) 09/26/2023 0429   MCV 87.3 09/26/2023 0429   MCH 29.0 09/26/2023 0429   MCHC 33.2 09/26/2023 0429   RDW 18.6 (H) 09/26/2023 0429   LYMPHSABS 0.9 09/26/2023 0429   MONOABS 0.3 09/26/2023 0429   EOSABS 0.2 09/26/2023 0429   BASOSABS 0.0 09/26/2023 0429    BMET    Component Value Date/Time   NA 137 09/24/2023 0551   K 3.6 09/24/2023 0551   CL 98 09/24/2023 0551   CO2 29 09/24/2023 0551   GLUCOSE 92 09/24/2023 0551   BUN 21 (H) 09/24/2023 0551   CREATININE 3.62 (H) 09/24/2023 0551   CALCIUM  8.5 (L) 09/24/2023 0551   GFRNONAA 14 (L) 09/24/2023 0551    INR    Component Value Date/Time   INR 1.1 09/24/2023 0627     Intake/Output Summary (Last 24 hours) at 09/26/2023 0727 Last data filed at 09/25/2023 1649 Gross per 24 hour  Intake 358 ml  Output 150 ml  Net 208 ml      Assessment/Plan:  58 y.o. female with prolonged bleeding from right upper arm AVG   -She is doing well this morning without any complaints. She denies any pain -Her right arm AVG is pulsatile on exam -Plans are for right upper arm fistulogram today with Dr.Tegh Franek. She remains agreeable for her procedure. She has been NPO past midnight   Deneise Finlay, [place] [facility] [phone] 09/26/2023 7:27 AM   VASCULAR STAFF ADDENDUM: I have independently interviewed and examined the patient. I agree with the above.  Cath lab today for fistulagram   Philipp Brawn MD [facility] Phone Number: [phone] 09/26/2023 11:52 AM

## 2023-09-27 ENCOUNTER — Other Ambulatory Visit (HOSPITAL_COMMUNITY): Payer: Self-pay

## 2023-09-27 DIAGNOSIS — D62 Acute posthemorrhagic anemia: Secondary | ICD-10-CM | POA: Diagnosis not present

## 2023-09-27 LAB — CBC WITH DIFFERENTIAL/PLATELET
Abs Immature Granulocytes: 0.01 10*3/uL (ref 0.00–0.07)
Basophils Absolute: 0 10*3/uL (ref 0.0–0.1)
Basophils Relative: 0 %
Eosinophils Absolute: 0.2 10*3/uL (ref 0.0–0.5)
Eosinophils Relative: 5 %
HCT: 25.1 % — ABNORMAL LOW (ref 36.0–46.0)
Hemoglobin: 8.3 g/dL — ABNORMAL LOW (ref 12.0–15.0)
Immature Granulocytes: 0 %
Lymphocytes Relative: 23 %
Lymphs Abs: 0.9 10*3/uL (ref 0.7–4.0)
MCH: 29.1 pg (ref 26.0–34.0)
MCHC: 33.1 g/dL (ref 30.0–36.0)
MCV: 88.1 fL (ref 80.0–100.0)
Monocytes Absolute: 0.2 10*3/uL (ref 0.1–1.0)
Monocytes Relative: 6 %
Neutro Abs: 2.6 10*3/uL (ref 1.7–7.7)
Neutrophils Relative %: 66 %
Platelets: 90 10*3/uL — ABNORMAL LOW (ref 150–400)
RBC: 2.85 MIL/uL — ABNORMAL LOW (ref 3.87–5.11)
RDW: 18.8 % — ABNORMAL HIGH (ref 11.5–15.5)
WBC: 3.9 10*3/uL — ABNORMAL LOW (ref 4.0–10.5)
nRBC: 0 % (ref 0.0–0.2)

## 2023-09-27 LAB — GLUCOSE, CAPILLARY: Glucose-Capillary: 171 mg/dL — ABNORMAL HIGH (ref 70–99)

## 2023-09-27 MED ORDER — HYDRALAZINE HCL 50 MG PO TABS
50.0000 mg | ORAL_TABLET | Freq: Three times a day (TID) | ORAL | Status: DC
  Administered 2023-09-27: 50 mg via ORAL
  Filled 2023-09-27: qty 1

## 2023-09-27 MED ORDER — DOCUSATE SODIUM 100 MG PO CAPS
200.0000 mg | ORAL_CAPSULE | Freq: Two times a day (BID) | ORAL | Status: DC
  Administered 2023-09-27: 200 mg via ORAL
  Filled 2023-09-27: qty 2

## 2023-09-27 MED ORDER — POLYETHYLENE GLYCOL 3350 17 G PO PACK
17.0000 g | PACK | Freq: Two times a day (BID) | ORAL | Status: DC
  Administered 2023-09-27: 17 g via ORAL
  Filled 2023-09-27: qty 1

## 2023-09-27 MED ORDER — PANTOPRAZOLE SODIUM 40 MG PO TBEC
40.0000 mg | DELAYED_RELEASE_TABLET | Freq: Every day | ORAL | 0 refills | Status: DC
  Filled 2023-09-27: qty 30, 30d supply, fill #0

## 2023-09-27 MED ORDER — BISACODYL 5 MG PO TBEC
10.0000 mg | DELAYED_RELEASE_TABLET | Freq: Once | ORAL | Status: AC
  Administered 2023-09-27: 10 mg via ORAL
  Filled 2023-09-27: qty 2

## 2023-09-27 NOTE — TOC Transition Note (Signed)
 Transition of Care [facility]) - Discharge Note   Patient Details  Name: Hannah [name] MRN: [id number] Date of Birth: Dec 29, 1965  Transition of [facility]) CM/SW Contact:  Eusebio High, RN Phone Number: 09/27/2023, 9:46 AM   Clinical Narrative:    Patient will DC to home today. Home Health PT and OT will be provided by [facility]. AVS has been updated No DME needed. Daughter will transport home.     Final next level of care: Skilled Nursing Facility Barriers to Discharge: Continued Medical Work up   Patient Goals and CMS Choice     Choice offered to / list presented to : Adult Children [place] ownership interest in [facility].provided to:: Adult Children    Discharge Placement                       Discharge Plan and Services Additional resources added to the After Visit Summary for   In-house Referral: Clinical Social Work   Post Acute Care Choice: Skilled Nursing Facility                               Social Drivers of Health (SDOH) Interventions SDOH Screenings   Food Insecurity: No Food Insecurity (09/23/2023)  Housing: Low Risk  (09/23/2023)  Transportation Needs: No Transportation Needs (09/23/2023)  Utilities: Not At Risk (09/23/2023)  Depression (PHQ2-9): Low Risk  (01/09/2021)  Tobacco Use: Low Risk  (09/23/2023)     Readmission Risk Interventions     No data to display

## 2023-09-27 NOTE — Progress Notes (Signed)
  [facility] Progress Note   Subjective:  Patient seen in room this am. They are planning to d/c her today and she is very excited about going home. No complaints today. Had fistulagram on 09/26/23. Last HD 09/26/23 with UF 1.6. She had cramping during last third of treatment.   Objective Vitals:   09/27/23 0000 09/27/23 0332 09/27/23 0828 09/27/23 0850  BP: (!) 153/71 (!) 169/65 (!) 155/58 (!) 155/58  Pulse: 91 83 84   Resp: 10 17 13    Temp: 98.4 F (36.9 C) 98 F (36.7 C) 98.5 F (36.9 C)   TempSrc: Oral Oral Oral   SpO2: 99% 99% 99%   Weight:      Height:         Additional Objective Labs: Basic Metabolic Panel: Recent Labs  Lab 09/23/23 1129 09/23/23 1133 09/24/23 0551  NA 137 136 137  K 3.5 3.4* 3.6  CL 96* 92* 98  CO2 30  --  29  GLUCOSE 121* 119* 92  BUN 12 14 21*  CREATININE 2.32* 2.50* 3.62*  CALCIUM  8.1*  --  8.5*   CBC: Recent Labs  Lab 09/23/23 1133 09/24/23 0627 09/24/23 1442 09/25/23 0601 09/26/23 0429 09/27/23 0449  WBC 6.6  --  5.1 5.6 3.9* 3.9*  NEUTROABS 5.1  --   --  3.6 2.5 2.6  HGB 5.7*  5.8*  --  8.0* 8.8* 8.0* 8.3*  HCT 17.5*  17.0*  --  23.7* 26.3* 24.1* 25.1*  MCV 91.6  --  85.6 86.5 87.3 88.1  PLT 107*   < > 106* 116* 89* 90*   < > = values in this interval not displayed.   Blood Culture No results found for: "SDES", "SPECREQUEST", "CULT", "REPTSTATUS"   Physical Exam General: Alert, nad Heart: RRR 2/6 SEM Lungs: Clear,normal wob Abdomen: soft, non-tender Extremities: No LE edema  Dialysis Access: RUE AVG +bruit   Medications:   amLODipine   10 mg Oral Daily   azaTHIOprine  50 mg Oral Daily   calcium  acetate  667 mg Oral TID WC   carvedilol   25 mg Oral BID WC   Chlorhexidine  Gluconate Cloth  6 each Topical Q0600   clopidogrel   75 mg Oral Daily   docusate sodium   200 mg Oral BID   feeding supplement (NEPRO CARB STEADY)  237 mL Oral BID BM   hydrALAZINE   50 mg Oral Q8H   insulin  aspart  0-5 Units  Subcutaneous QHS   insulin  aspart  0-6 Units Subcutaneous TID WC   insulin  glargine-yfgn  8 Units Subcutaneous QHS   pantoprazole  40 mg Oral Daily   polyethylene glycol  17 g Oral BID   rosuvastatin   20 mg Oral Daily    Dialysis Orders:  AF MWF 3:30 350/A1.5x  EDW 46kg 3K/2.5Ca AVG 16g x2 -Heparin  1500 bolus -Mircera 50 q 2 wks (last 5/5) -Venofer  100 x 5  -Hectorol  1 mcg TIW   Assessment/Plan: ABLA 2/2 bleeding from AV graft -- Subacute oozing from graft for > 1 month. Vascular did fistulogram and central venogram performed 09/26/23. Angiogram demonstrated wide patency with minimal residual stenosis and brisk flow.  Hgb 5.7 on admit. Transfused 2 u prbcs on 5/9.  Follow CBC.  Stroke like symptoms - H/o CVA w residual R hemiparesis. No acute CVA on MRI. ESRD -  HD MWF. Continue on schedule.  Next HD Mon. No heparin   Hypertension - BP elevated on arrival. Home meds resumed. Improving.  Anemia  - As above.  Received Mircera on 5/5.  Secondary HPTH  - Ca acceptable. Ca acetate binder  T2DM - Insulin  per primary  CAD s/p PCI   Hannah Lorenzo, NP [facility] 09/27/2023,12:40 PM  Pt seen, examined and agree w A/P as above.  Larry Poag MD  [facility] 09/27/2023, 12:40 PM

## 2023-09-27 NOTE — Progress Notes (Signed)
 Physical Therapy Treatment Patient Details Name: Hannah [name] MRN: [id number] DOB: 10/03/1965 Today's Date: 09/27/2023   History of Present Illness 58 y.o. F who presented 5/9 with worsening of her right hemiparesis and dysarthria after dialysis. Symptomatic anemia due to intermittent right upper extremity AV fistula site bleeding. Stroke-like symptoms appears to be recrudescence of old stroke symptoms due to anemia. PMH: ESRD on HD, HTN, CAD s/p remote PCI, retinitis on azathioprine, DM, stroke with residual R hemiparesis and dysarthria, chronic pain.    PT Comments  Showing some improvement today, min assist for bed mobility and transfer with heavy VC for sequencing and technique. Still with significant posterior lean initially. Gradually improved with exercises, cues, and techniques for stabilizing center of balance in standing. Sat EOB with CGA today no LOB. Able to take a few steps forward and backwards with RW, heavy cues and min assist to facilitate weight shift and maintain balance. Reviewed LE exercises and encouraged to perform at least a couple of times daily. Patient will continue to benefit from skilled physical therapy services to further improve independence with functional mobility.     If plan is discharge home, recommend the following: A lot of help with walking and/or transfers;Assistance with cooking/housework;Assist for transportation;Help with stairs or ramp for entrance;A little help with bathing/dressing/bathroom   Can travel by private vehicle     Yes  Equipment Recommendations  None recommended by PT    Recommendations for Other Services       Precautions / Restrictions Precautions Precautions: Fall Recall of Precautions/Restrictions: Intact Restrictions Weight Bearing Restrictions Per Provider Order: No     Mobility  Bed Mobility Overal bed mobility: Needs Assistance Bed Mobility: Supine to Sit, Sit to Supine     Supine to sit: Min assist, HOB  elevated Sit to supine: Min assist   General bed mobility comments: Min assist to pull through therapist's hand with LUE, cues to avoid RUE strain with new graft. Min assist for LEs back into bed with cues.    Transfers Overall transfer level: Needs assistance Equipment used: Rolling walker (2 wheels) Transfers: Sit to/from Stand Sit to Stand: Min assist           General transfer comment: Min assist for boost to stand x2 from edge of bed. Cues for forward momentum, assisted with RUE placement on RW.    Ambulation/Gait Ambulation/Gait assistance: Min assist Gait Distance (Feet): 3 Feet Assistive device: Rolling walker (2 wheels) Gait Pattern/deviations: Step-to pattern, Decreased stride length, Decreased dorsiflexion - right, Knee flexed in stance - right, Knee flexed in stance - left, Trunk flexed, Leaning posteriorly Gait velocity: dec Gait velocity interpretation: <1.31 ft/sec, indicative of household ambulator   General Gait Details: Educated on safe AD use with RW. Cues for sequencing. Min assist for balance and to facilitate weight shift to take several steps forwards and backwards with RW for support. No overt buckling. Does demonstrate posterior LOB.   Stairs             Wheelchair Mobility     Tilt Bed    Modified Rankin (Stroke Patients Only)       Balance Overall balance assessment: Needs assistance Sitting-balance support: Feet supported, No upper extremity supported Sitting balance-Leahy Scale: Fair Sitting balance - Comments: CGA EOB   Standing balance support: Bilateral upper extremity supported, Reliant on assistive device for balance Standing balance-Leahy Scale: Poor Standing balance comment: Posterior lean  Communication Communication Communication: Impaired Factors Affecting Communication: Reduced clarity of speech  Cognition Arousal: Alert Behavior During Therapy: WFL for tasks  assessed/performed   PT - Cognitive impairments: Initiation, Problem solving, Sequencing                       PT - Cognition Comments: Delayed, family reports nearly at baseline Following commands: Intact      Cueing Cueing Techniques: Verbal cues  Exercises General Exercises - Lower Extremity Ankle Circles/Pumps: AROM, Both, AAROM, 15 reps, Seated Long Arc Quad: Strengthening, Both, 10 reps, Seated Hip ABduction/ADduction: Strengthening, Both, 10 reps, Seated Hip Flexion/Marching: Strengthening, Both, 10 reps, Seated Other Exercises Other Exercises: Standing balance, weight shift, A/P rocking. Static march    General Comments        Pertinent Vitals/Pain Pain Assessment Pain Assessment: No/denies pain    Home Living                          Prior Function            PT Goals (current goals can now be found in the care plan section) Acute Rehab PT Goals Patient Stated Goal: Get stronger PT Goal Formulation: With patient Time For Goal Achievement: 10/08/23 Potential to Achieve Goals: Good Progress towards PT goals: Progressing toward goals    Frequency    Min 2X/week      PT Plan      Co-evaluation              AM-PAC PT "6 Clicks" Mobility   Outcome Measure  Help needed turning from your back to your side while in a flat bed without using bedrails?: A Little Help needed moving from lying on your back to sitting on the side of a flat bed without using bedrails?: A Little Help needed moving to and from a bed to a chair (including a wheelchair)?: A Lot Help needed standing up from a chair using your arms (e.g., wheelchair or bedside chair)?: A Little Help needed to walk in hospital room?: A Lot Help needed climbing 3-5 steps with a railing? : Total 6 Click Score: 14    End of Session Equipment Utilized During Treatment: Gait belt Activity Tolerance: Patient tolerated treatment well;Other (comment) (weakness) Patient left: in  bed;with call bell/phone within reach;with bed alarm set;with family/visitor present;with nursing/sitter in room Nurse Communication: Mobility status PT Visit Diagnosis: Unsteadiness on feet (R26.81);Muscle weakness (generalized) (M62.81);Difficulty in walking, not elsewhere classified (R26.2)     Time: 1610-9604 PT Time Calculation (min) (ACUTE ONLY): 15 min  Charges:    $Therapeutic Exercise: 8-22 mins PT General Charges $$ ACUTE PT VISIT: 1 Visit                     Jory Ng, PT, DPT [facility]  Rehabilitation Services Physical Therapist Office: [phone] Website: [email or web address]    Alinda Irani 09/27/2023, 11:56 AM

## 2023-09-27 NOTE — Progress Notes (Signed)
D/C order noted. Contacted [facility] to advise clinic of pt's d/c today and that pt should resume care tomorrow.   Olivia Canter Renal Navigator [phone]

## 2023-09-27 NOTE — Progress Notes (Signed)
  Progress Note    09/27/2023 8:32 AM 1 Day Post-Op  Subjective: She is doing well this morning.  She denies any pain    Vitals:   09/27/23 0332 09/27/23 0828  BP: (!) 169/65 (!) 155/58  Pulse: 83 84  Resp: 17 13  Temp: 98 F (36.7 C) 98.5 F (36.9 C)  SpO2: 99% 99%    Physical Exam: General: Resting comfortably Lungs: Nonlabored Extremities: Right upper arm AV graft with great thrill, cannulation sites bandaged and dry  CBC    Component Value Date/Time   WBC 3.9 (L) 09/27/2023 0449   RBC 2.85 (L) 09/27/2023 0449   HGB 8.3 (L) 09/27/2023 0449   HCT 25.1 (L) 09/27/2023 0449   PLT 90 (L) 09/27/2023 0449   MCV 88.1 09/27/2023 0449   MCH 29.1 09/27/2023 0449   MCHC 33.1 09/27/2023 0449   RDW 18.8 (H) 09/27/2023 0449   LYMPHSABS 0.9 09/27/2023 0449   MONOABS 0.2 09/27/2023 0449   EOSABS 0.2 09/27/2023 0449   BASOSABS 0.0 09/27/2023 0449    BMET    Component Value Date/Time   NA 137 09/24/2023 0551   K 3.6 09/24/2023 0551   CL 98 09/24/2023 0551   CO2 29 09/24/2023 0551   GLUCOSE 92 09/24/2023 0551   BUN 21 (H) 09/24/2023 0551   CREATININE 3.62 (H) 09/24/2023 0551   CALCIUM  8.5 (L) 09/24/2023 0551   GFRNONAA 14 (L) 09/24/2023 0551    INR    Component Value Date/Time   INR 1.1 09/24/2023 0627     Intake/Output Summary (Last 24 hours) at 09/27/2023 0832 Last data filed at 09/26/2023 1845 Gross per 24 hour  Intake --  Output 1.6 ml  Net -1.6 ml      Assessment/Plan:  58 y.o. female is 1 day postop, s/p: RUE fistulogram with right innominate vein stenting  -She is doing well without any issues this morning.  She denies any pain -Her right upper arm AV graft has a great thrill on palpation -Dialysis was able to use her graft yesterday without prolonged bleeding afterwards -Right upper arm is dressed and dry -She is stable for discharge from a vascular perspective.  She can follow-up with our office as needed   Deneise Finlay, [facility] [phone] 09/27/2023 8:32 AM

## 2023-09-27 NOTE — Discharge Summary (Addendum)
 Hannah [name] ZOX:[id number] DOB: June 05, 1965 DOA: 09/23/2023  PCP: Arlys Berke, MD  Admit date: 09/23/2023  Discharge date: 09/27/2023  Admitted From: Home   Disposition:  Home   Recommendations for Outpatient Follow-up:   Follow up with PCP in 1-2 weeks  PCP Please obtain BMP/CBC, 2 view CXR in 1week,  (see Discharge instructions)   PCP Please follow up on the following pending results:    Home Health: PT   Equipment/Devices: as below  Consultations: VVS, Renal Discharge Condition: Stable    CODE STATUS: Full    Diet Recommendation: Renal-low carbohydrate diet with 1.2 L fluid restriction per day.    Chief Complaint  Patient presents with   Code Stroke     Brief history of present illness from the day of admission and additional interim summary     58 y.o. F with ESRD on HD, HTN, CAD s/p remote PCI, retinitis on azathioprine, DM, stroke with residual R hemiparesis and dysarthria, chronic pain who presented with worsening of her right hemiparesis and dysarthria after dialysis.  NIHSS 5.  In the ER workup was negative for stroke eventually CT and MRI nonacute, her blood work in the ER suggested her hemoglobin was 5.7, patient apparently has been having a lot of issues with her right arm fistula bleed after every dialysis session related to her central stenosis.  She was admitted for further care.                                                                  Hospital Course   Symptomatic anemia due to intermittent right upper extremity AV fistula site bleeding which has been subacute to chronic. She has no history suggestive of GI bleed, DIC panel negative, has received 2 units of packed RBC transfusion on 09/23/2023, posttransfusion clinically back to baseline, recheck CBC, no signs of ongoing bleeding.   Nephrology on board along with vascular surgery, vascular surgery she underwent fistulogram with stent placement in the right innominate vein by Dr. Susi Eric on 09/26/2023.  Stable, continue Plavix  with outpatient follow-up with with renal and VVS.  Patient completely symptom-free CBC stable.   Stroke-like symptoms This appears to be recrudescence of old stroke symptoms due to anemia. Neurology evaluated, feel new acute stroke is unlikely, MRI negative, continue Plavix  and Crestor  along with supportive care.  Close to her baseline now has chronic right-sided hemiparesis from previous stroke.  Continue Plavix  and statin for secondary prevention along with home PT.   Chorioretinitis - Continue azathioprine   CAD S/P PCI x 2  - Continue Plavix , Crestor , carvedilol    ESRD on hemodialysis [facility])  - Consult nephrology MWF schedule.   Essential hypertension continue home regimen.   Hyperlipidemia associated with type 2 diabetes mellitus (HCC)  - Continue Crestor    Type  2 diabetes mellitus (HCC)  - Continue Tresiba  & Home Rx   Procedure  09/26/2023 Pre-operative Diagnosis: ESRD on HD Post-operative diagnosis:  Same Surgeon:  Philipp Brawn, MD Procedure Performed:   Ultrasound-guided access of right upper extremity graft Fistulogram and central venogram Stent of right innominate vein with 10 mm x 25 mm LD express  Discharge diagnosis     Principal Problem:   Anemia Active Problems:   Stroke-like symptoms   Hyperlipidemia associated with type 2 diabetes mellitus (HCC)   Type 2 diabetes mellitus (HCC)   Essential hypertension   ESRD on hemodialysis (HCC)   CAD S/P PCI x 2   Hemiparesis affecting right side as late effect of cerebrovascular accident (CVA) (HCC)   Chorioretinitis   Symptomatic anemia    Discharge instructions    Discharge Instructions     Discharge instructions   Complete by: As directed    Follow with Primary MD Arlys Berke, MD in 7 days   Get CBC, CMP, 2  view Chest X ray -  checked next visit with your primary MD   Activity: As tolerated with Full fall precautions use walker/cane & assistance as needed  Disposition Home    Diet: Renal-low carbohydrate diet.  Strict 1.2 L fluid restriction per day.  Check CBGs q. [facility]  Special Instructions: If you have smoked or chewed Tobacco  in the last 2 yrs please stop smoking, stop any regular Alcohol  and or any Recreational drug use.  On your next visit with your primary care physician please Get Medicines reviewed and adjusted.  Please request your Prim.MD to go over all Hospital Tests and Procedure/Radiological results at the follow up, please get all Hospital records sent to your Prim MD by signing hospital release before you go home.  If you experience worsening of your admission symptoms, develop shortness of breath, life threatening emergency, suicidal or homicidal thoughts you must seek medical attention immediately by calling 911 or calling your MD immediately  if symptoms less severe.  You Must read complete instructions/literature along with all the possible adverse reactions/side effects for all the Medicines you take and that have been prescribed to you. Take any new Medicines after you have completely understood and accpet all the possible adverse reactions/side effects.   Do not drive when taking Pain medications.  Do not take more than prescribed Pain, Sleep and Anxiety Medications  Wear Seat belts while driving.   Increase activity slowly   Complete by: As directed        Discharge Medications   Allergies as of 09/27/2023       Reactions   Ibuprofen Swelling   Swelling to hands, feet and face   Influenza Vaccines Anaphylaxis, Swelling   fever   Pneumococcal Vaccine Anaphylaxis, Swelling   fever   Promethazine Other (See Comments)   hallucinations   Ambien  [zolpidem ] Other (See Comments)   Unknown reaction   Codeine Itching   Cyclobenzaprine Other (See Comments)    hallucinations   Hydrocodone -acetaminophen  Itching   Tape Itching   Tramadol  Hcl Other (See Comments)   hallucinations   Wound Dressing Adhesive Itching   Use paper tape   Oxycodone  Nausea And Vomiting        Medication List     TAKE these medications    acetaminophen  500 MG tablet Commonly known as: TYLENOL  Take 500 mg by mouth every 6 (six) hours as needed for mild pain (pain score 1-3), moderate pain (pain score 4-6),  fever or headache.   amLODipine  10 MG tablet Commonly known as: NORVASC  Take 1 tablet (10 mg total) by mouth daily. Except on dialysis days do not take a dose.   azaTHIOprine 50 MG tablet Commonly known as: IMURAN Take 50 mg by mouth daily.   calcium  acetate 667 MG capsule Commonly known as: PHOSLO  Take 667 mg by mouth in the morning, at noon, and at bedtime.   carvedilol  25 MG tablet Commonly known as: COREG  Take 1 tablet (25 mg total) by mouth 2 (two) times daily with a meal. And as directed.   clopidogrel  75 MG tablet Commonly known as: PLAVIX  Take 1 tablet (75 mg total) by mouth daily.   diclofenac  Sodium 1 % Gel Commonly known as: VOLTAREN  Apply 2 g topically 4 (four) times daily. To leg What changed:  when to take this reasons to take this   famotidine  20 MG tablet Commonly known as: PEPCID  Take 1 tablet (20 mg total) by mouth daily.   FreeStyle Libre 2 Sensor Misc USE AS DIRECTED CHANGE EVERY 14 DAYS   insulin  lispro 100 UNIT/ML KwikPen Commonly known as: HUMALOG  Inject 5 Units into the skin 3 (three) times daily. What changed: how much to take   lidocaine -prilocaine  cream Commonly known as: EMLA  Apply 1 Application topically daily as needed (port access).   metoCLOPramide  5 MG tablet Commonly known as: REGLAN  Take 5 mg by mouth every 6 (six) hours as needed for nausea or vomiting.   nitroGLYCERIN  0.4 MG SL tablet Commonly known as: NITROSTAT  Place 1 tablet (0.4 mg total) under the tongue every 5 (five) minutes as needed  for chest pain.   ondansetron  4 MG tablet Commonly known as: ZOFRAN  Take 4 mg by mouth every 8 (eight) hours as needed for nausea or vomiting.   pantoprazole 40 MG tablet Commonly known as: PROTONIX Take 1 tablet (40 mg total) by mouth daily.   polyethylene glycol 17 g packet Commonly known as: MIRALAX  / GLYCOLAX  Take 17 g by mouth daily. What changed:  when to take this reasons to take this   rosuvastatin  20 MG tablet Commonly known as: CRESTOR  Take 20 mg by mouth daily.   senna-docusate 8.6-50 MG tablet Commonly known as: Senokot-S Take 1 tablet by mouth 2 (two) times daily as needed for moderate constipation. What changed: reasons to take this   Tresiba  FlexTouch 100 UNIT/ML FlexTouch Pen Generic drug: insulin  degludec Inject 12 Units into the skin at bedtime. What changed: how much to take         Follow-up Information     Madden, Evan J, MD. Schedule an appointment as soon as possible for a visit in 1 week(s).   Specialty: Family Medicine Contact information: [address] [place] [address] [phone]         Philipp Brawn, MD. Schedule an appointment as soon as possible for a visit in 1 week(s).   Specialty: Vascular Surgery Contact information: [address] [place] [address] [phone]                 Major procedures and Radiology Reports - PLEASE review detailed and final reports thoroughly  -       PERIPHERAL VASCULAR CATHETERIZATION Result Date: 09/26/2023 Images from the original result were not included. Patient name: Jarica Vandekamp MRN: [id number] DOB: 05-Oct-1965 Sex: female 09/26/2023 Pre-operative Diagnosis: ESRD on HD Post-operative diagnosis:  Same Surgeon:  Philipp Brawn, MD Procedure Performed: Ultrasound-guided access of right upper extremity graft Fistulogram  and central venogram Stent of right innominate vein with 10 mm x 25 mm LD express Indications: Ms. Kalmbach is a 58 year old female who was  admitted with symptomatic anemia.  She has been having prolonged bleeding after dialysis sessions and has had multiple interventions on the right arm graft.  Most recently an innominate stenosis was balloon angioplastied.  The graft was noted to be pulsatile on exam.  There are some benefits of fistulogram with intervention and possible stenting were reviewed.  She expressed understanding and elected to proceed. Findings: Widely patent right arm graft, patent outflow vein stents with mild approximate 30% stenosis just central to the stents.  Axillary and subclavian veins are widely patent.  The right innominate vein has significant stenosis approximately 70%.  Procedure:  The patient was identified in the holding area and taken to the cath lab  The patient was then placed supine on the table and prepped and draped in the usual sterile fashion.  A time out was called.  Ultrasound was used to evaluate the right arm AV access. This was accessed under u/s guidance. An 018 wire was advanced without resistance, a micropuncture sheath was placed and fistulagram obtained which demonstrated the above findings.  This access was then upsized to a 6 F short sheath over a glidewire.  The innominate vein stenosis was treated with a 10 mm x 25 mm LD express stent.  Completion angiogram demonstrated wide patency with minimal residual stenosis and brisk flow. Philipp Brawn MD [facility] Office: [phone]  MR BRAIN WO CONTRAST Result Date: 09/23/2023 CLINICAL DATA:  Initial evaluation for acute neuro deficit, stroke suspected. EXAM: MRI HEAD WITHOUT CONTRAST TECHNIQUE: Multiplanar, multiecho pulse sequences of the brain and surrounding structures were obtained without intravenous contrast. COMPARISON:  CT from earlier the same day. FINDINGS: Brain: Examination severely degraded by motion artifact. Cerebral volume within normal limits. Patchy T2/FLAIR hyperintensity involving the supratentorial  cerebral white matter, consistent with chronic small vessel ischemic disease, moderate to advanced in nature. Few scatter remote lacunar infarcts noted about the hemispheric cerebral white matter, deep gray nuclei, and pons. No convincing foci of restricted diffusion to suggest acute or subacute ischemia. Gray-white matter differentiation maintained. No acute intracranial hemorrhage. Few chronic micro hemorrhages noted about the pons and thalami, likely hypertensive in nature. No mass lesion, midline shift or mass effect. No hydrocephalus or extra-axial fluid collection. Pituitary gland within normal limits. Vascular: Major intracranial vascular flow voids are maintained at the skull base. Skull and upper cervical spine: Craniocervical junction within normal limits. Bone marrow signal intensity grossly normal. No scalp soft tissue abnormality. Sinuses/Orbits: Prior bilateral ocular lens replacement. Left sphenoid sinus retention cyst. Paranasal sinuses are otherwise clear. No mastoid effusion. Other: None. IMPRESSION: 1. Motion degraded exam. 2. No acute intracranial abnormality. 3. Moderate to advanced chronic microvascular ischemic disease with a few scattered remote lacunar infarcts as above. Electronically Signed   By: Virgia Griffins M.D.   On: 09/23/2023 20:13   CT HEAD CODE STROKE WO CONTRAST Result Date: 09/23/2023 CLINICAL DATA:  Code stroke. Neuro deficit, acute, stroke suspected. EXAM: CT HEAD WITHOUT CONTRAST TECHNIQUE: Contiguous axial images were obtained from the base of the skull through the vertex without intravenous contrast. RADIATION DOSE REDUCTION: This exam was performed according to the departmental dose-optimization program which includes automated exposure control, adjustment of the mA and/or kV according to patient size and/or use of iterative reconstruction technique. COMPARISON:  Brain MRI 05/05/2022.  Head CT 05/05/2022.  FINDINGS: Brain: Mild generalized cerebral atrophy. Lacunar  infarct within the right corona radiata, new from prior exams but chronic in appearance (series 5, image 40). Chronic lacunar infarcts within/about the bilateral deep gray nuclei, not appreciably changed from the prior brain MRI of 05/05/2022. Background age-advanced cerebral white matter disease. There is no acute intracranial hemorrhage. No demarcated cortical infarct. No extra-axial fluid collection. No evidence of an intracranial mass. No midline shift. Vascular: No hyperdense vessel.  Atherosclerotic calcifications. Skull: No calvarial fracture or aggressive osseous lesion. Sinuses/Orbits: No mass or acute finding within the imaged orbits. 11 mm mucous retention cyst within the left sphenoid sinus. ASPECTS [facility] Stroke Program Early CT Score) - Ganglionic level infarction (caudate, lentiform nuclei, internal capsule, insula, M1-M3 cortex): 7 - Supraganglionic infarction (M4-M6 cortex): 3 Total score (0-10 with 10 being normal): 10 (when discounting chronic infarcts). No evidence of an acute intracranial abnormality. These results were communicated to Dr. Murvin Arthurs At 11:46 amon 5/9/2025by text page via the [facility] messaging system. IMPRESSION: 1. No evidence of an acute intracranial abnormality. 2. Lacunar infarct within the right corona radiata, new from the prior brain MRI of 05/05/2022 but chronic in appearance. 3. Unchanged chronic lacunar infarcts within/about the bilateral deep gray nuclei. 4. Background age-advanced cerebral white matter disease. This likely at least partly reflects chronic small vessel ischemia. However, a history of potential multiple sclerosis was noted on prior radiology examinations and a component of demyelinating disease is possible. 5. Mild generalized cerebral atrophy. 6. 11 mm left sphenoid sinus mucous retention cyst. Electronically Signed   By: Bascom Lily D.O.   On: 09/23/2023 11:49   DG Chest Portable 1 View Result Date: 09/04/2023 CLINICAL DATA:  Chest pain EXAM:  PORTABLE CHEST 1 VIEW COMPARISON:  04/29/2023. FINDINGS: Cardiac silhouette appears prominent. No pneumonia or pulmonary edema. No pneumothorax or pleural effusion. IMPRESSION: Enlarged cardiac silhouette. Electronically Signed   By: Sydell Eva M.D.   On: 09/04/2023 14:02    Micro Results    No results found for this or any previous visit (from the past 240 hours).  Today   Subjective    Hannah [name] today has no headache,no chest abdominal pain,no new weakness tingling or numbness, feels much better wants to go home today.    Objective   Blood pressure (!) 169/65, pulse 83, temperature 98 F (36.7 C), temperature source Oral, resp. rate 17, height 5\' 2"  (1.575 m), weight 46.7 kg, SpO2 99%.   Intake/Output Summary (Last 24 hours) at 09/27/2023 0817 Last data filed at 09/26/2023 1845 Gross per 24 hour  Intake --  Output 1.6 ml  Net -1.6 ml    Exam  Awake Alert, No new F.N deficits, baseline right-sided hemiparesis. [place].AT,PERRAL Supple Neck,   Symmetrical Chest wall movement, Good air movement bilaterally, CTAB RRR,No Gallops,   +ve B.Sounds, Abd Soft, Non tender,  No Cyanosis, Clubbing or edema    Data Review   Recent Labs  Lab 09/23/23 1133 09/24/23 0627 09/24/23 1442 09/25/23 0601 09/26/23 0429 09/27/23 0449  WBC 6.6  --  5.1 5.6 3.9* 3.9*  HGB 5.7*  5.8*  --  8.0* 8.8* 8.0* 8.3*  HCT 17.5*  17.0*  --  23.7* 26.3* 24.1* 25.1*  PLT 107* 95* 106* 116* 89* 90*  MCV 91.6  --  85.6 86.5 87.3 88.1  MCH 29.8  --  28.9 28.9 29.0 29.1  MCHC 32.6  --  33.8 33.5 33.2 33.1  RDW 14.8  --  18.9* 18.9* 18.6* 18.8*  LYMPHSABS 0.9  --   --  1.4 0.9 0.9  MONOABS 0.3  --   --  0.3 0.3 0.2  EOSABS 0.2  --   --  0.2 0.2 0.2  BASOSABS 0.0  --   --  0.0 0.0 0.0    Recent Labs  Lab 09/23/23 1129 09/23/23 1133 09/24/23 0551 09/24/23 0627  NA 137 136 137  --   K 3.5 3.4* 3.6  --   CL 96* 92* 98  --   CO2 30  --  29  --   ANIONGAP 11  --  10  --   GLUCOSE 121*  119* 92  --   BUN 12 14 21*  --   CREATININE 2.32* 2.50* 3.62*  --   AST 15  --   --   --   ALT 6  --   --   --   ALKPHOS 40  --   --   --   BILITOT 0.6  --   --   --   ALBUMIN 3.1*  --   --   --   DDIMER  --   --   --  0.28  INR 1.1  --   --  1.1  CALCIUM  8.1*  --  8.5*  --     Total Time in preparing paper work, data evaluation and todays exam - 35 minutes  Signature  -    Lynnwood Sauer M.D on 09/27/2023 at 8:17 AM   -  To page go to [email or web address]

## 2023-09-27 NOTE — Discharge Instructions (Signed)
 Follow with Primary MD Arlys Berke, MD in 7 days   Get CBC, CMP, 2 view Chest X ray -  checked next visit with your primary MD   Activity: As tolerated with Full fall precautions use walker/cane & assistance as needed  Disposition Home    Diet: Renal-low carbohydrate diet.  Strict 1.2 L fluid restriction per day.  Check CBGs q. [facility]  Special Instructions: If you have smoked or chewed Tobacco  in the last 2 yrs please stop smoking, stop any regular Alcohol  and or any Recreational drug use.  On your next visit with your primary care physician please Get Medicines reviewed and adjusted.  Please request your Prim.MD to go over all Hospital Tests and Procedure/Radiological results at the follow up, please get all Hospital records sent to your Prim MD by signing hospital release before you go home.  If you experience worsening of your admission symptoms, develop shortness of breath, life threatening emergency, suicidal or homicidal thoughts you must seek medical attention immediately by calling 911 or calling your MD immediately  if symptoms less severe.  You Must read complete instructions/literature along with all the possible adverse reactions/side effects for all the Medicines you take and that have been prescribed to you. Take any new Medicines after you have completely understood and accpet all the possible adverse reactions/side effects.   Do not drive when taking Pain medications.  Do not take more than prescribed Pain, Sleep and Anxiety Medications  Wear Seat belts while driving.

## 2023-09-27 NOTE — Discharge Planning (Signed)
 [facility] Patient Discharge Orders - [facility] CLINIC: [facility] MWF  Patient's name: Ashya Antilla Admit/DC Dates: 09/23/2023 - 09/27/2023  DISCHARGE DIAGNOSES: Acute blood loss anemia- from bleeding AV graft. F'gram performed and showed wide patency, with minimal residual stenosis, and brisk flow.   Stroke-like symptoms- No acute CVA on MRI  HD ORDER CHANGES: Heparin  change: no EDW Change: yes New EDW: 46.7 kg [place] Change: no  ANEMIA MANAGEMENT: Aranesp : Given: no    ESA dose for discharge: mircera 50 mcg IV q 2 weeks, to start on 10/03/23 IV Iron dose at discharge: none given due to TSAT 54 Transfusion: Given: 2U PRBC given on 09/23/23  BONE/MINERAL MEDICATIONS: Hectorol /Calcitriol change: per protocol Sensipar/Parsabiv change: per protocol  ACCESS INTERVENTION/CHANGE: no Details:   RECENT LABS: Recent Labs  Lab 09/23/23 1129 09/23/23 1133 09/24/23 0551 09/24/23 1442 09/27/23 0449  HGB  --    < >  --    < > 8.3*  NA 137   < > 137  --   --   K 3.5   < > 3.6  --   --   CALCIUM  8.1*  --  8.5*  --   --   ALBUMIN 3.1*  --   --   --   --    < > = values in this interval not displayed.    IV ANTIBIOTICS: no Details:  OTHER ANTICOAGULATION: On Coumadin ?: no Last INR: Managed By:  OTHER/APPTS/LAB ORDERS: Please draw updated monthly labs on HD. Thank you.   D/C Meds to be reconciled by nurse after every discharge.  Completed By: Hersey Lorenzo, NP-C   Reviewed by: MD:______ RN_______

## 2023-09-28 ENCOUNTER — Telehealth: Payer: Self-pay | Admitting: Nurse Practitioner

## 2023-09-28 NOTE — Telephone Encounter (Signed)
 I called and spoke with her daughter today. She was unable to get to HD today, due to GI discomfort. She will be at her regular HD on Friday. I did ask them to try to schedule an extra treatment Thursday, but they have a funeral to go to.

## 2023-09-30 DIAGNOSIS — R519 Headache, unspecified: Secondary | ICD-10-CM | POA: Diagnosis not present

## 2023-09-30 DIAGNOSIS — D689 Coagulation defect, unspecified: Secondary | ICD-10-CM | POA: Diagnosis not present

## 2023-09-30 DIAGNOSIS — N2581 Secondary hyperparathyroidism of renal origin: Secondary | ICD-10-CM | POA: Diagnosis not present

## 2023-09-30 DIAGNOSIS — N186 End stage renal disease: Secondary | ICD-10-CM | POA: Diagnosis not present

## 2023-09-30 DIAGNOSIS — D631 Anemia in chronic kidney disease: Secondary | ICD-10-CM | POA: Diagnosis not present

## 2023-09-30 DIAGNOSIS — E1122 Type 2 diabetes mellitus with diabetic chronic kidney disease: Secondary | ICD-10-CM | POA: Diagnosis not present

## 2023-09-30 DIAGNOSIS — Z992 Dependence on renal dialysis: Secondary | ICD-10-CM | POA: Diagnosis not present

## 2023-09-30 DIAGNOSIS — D509 Iron deficiency anemia, unspecified: Secondary | ICD-10-CM | POA: Diagnosis not present

## 2023-10-03 DIAGNOSIS — D689 Coagulation defect, unspecified: Secondary | ICD-10-CM | POA: Diagnosis not present

## 2023-10-03 DIAGNOSIS — N2581 Secondary hyperparathyroidism of renal origin: Secondary | ICD-10-CM | POA: Diagnosis not present

## 2023-10-03 DIAGNOSIS — E1122 Type 2 diabetes mellitus with diabetic chronic kidney disease: Secondary | ICD-10-CM | POA: Diagnosis not present

## 2023-10-03 DIAGNOSIS — N186 End stage renal disease: Secondary | ICD-10-CM | POA: Diagnosis not present

## 2023-10-03 DIAGNOSIS — Z992 Dependence on renal dialysis: Secondary | ICD-10-CM | POA: Diagnosis not present

## 2023-10-03 DIAGNOSIS — D631 Anemia in chronic kidney disease: Secondary | ICD-10-CM | POA: Diagnosis not present

## 2023-10-03 DIAGNOSIS — D509 Iron deficiency anemia, unspecified: Secondary | ICD-10-CM | POA: Diagnosis not present

## 2023-10-03 DIAGNOSIS — R519 Headache, unspecified: Secondary | ICD-10-CM | POA: Diagnosis not present

## 2023-10-05 DIAGNOSIS — D509 Iron deficiency anemia, unspecified: Secondary | ICD-10-CM | POA: Diagnosis not present

## 2023-10-05 DIAGNOSIS — N186 End stage renal disease: Secondary | ICD-10-CM | POA: Diagnosis not present

## 2023-10-05 DIAGNOSIS — D631 Anemia in chronic kidney disease: Secondary | ICD-10-CM | POA: Diagnosis not present

## 2023-10-05 DIAGNOSIS — N2581 Secondary hyperparathyroidism of renal origin: Secondary | ICD-10-CM | POA: Diagnosis not present

## 2023-10-05 DIAGNOSIS — D689 Coagulation defect, unspecified: Secondary | ICD-10-CM | POA: Diagnosis not present

## 2023-10-05 DIAGNOSIS — Z992 Dependence on renal dialysis: Secondary | ICD-10-CM | POA: Diagnosis not present

## 2023-10-05 DIAGNOSIS — E1122 Type 2 diabetes mellitus with diabetic chronic kidney disease: Secondary | ICD-10-CM | POA: Diagnosis not present

## 2023-10-05 DIAGNOSIS — R519 Headache, unspecified: Secondary | ICD-10-CM | POA: Diagnosis not present

## 2023-10-07 DIAGNOSIS — N2581 Secondary hyperparathyroidism of renal origin: Secondary | ICD-10-CM | POA: Diagnosis not present

## 2023-10-07 DIAGNOSIS — N186 End stage renal disease: Secondary | ICD-10-CM | POA: Diagnosis not present

## 2023-10-07 DIAGNOSIS — E1122 Type 2 diabetes mellitus with diabetic chronic kidney disease: Secondary | ICD-10-CM | POA: Diagnosis not present

## 2023-10-07 DIAGNOSIS — D689 Coagulation defect, unspecified: Secondary | ICD-10-CM | POA: Diagnosis not present

## 2023-10-07 DIAGNOSIS — D509 Iron deficiency anemia, unspecified: Secondary | ICD-10-CM | POA: Diagnosis not present

## 2023-10-07 DIAGNOSIS — R519 Headache, unspecified: Secondary | ICD-10-CM | POA: Diagnosis not present

## 2023-10-07 DIAGNOSIS — Z992 Dependence on renal dialysis: Secondary | ICD-10-CM | POA: Diagnosis not present

## 2023-10-07 DIAGNOSIS — D631 Anemia in chronic kidney disease: Secondary | ICD-10-CM | POA: Diagnosis not present

## 2023-10-10 DIAGNOSIS — R519 Headache, unspecified: Secondary | ICD-10-CM | POA: Diagnosis not present

## 2023-10-10 DIAGNOSIS — N2581 Secondary hyperparathyroidism of renal origin: Secondary | ICD-10-CM | POA: Diagnosis not present

## 2023-10-10 DIAGNOSIS — D689 Coagulation defect, unspecified: Secondary | ICD-10-CM | POA: Diagnosis not present

## 2023-10-10 DIAGNOSIS — D631 Anemia in chronic kidney disease: Secondary | ICD-10-CM | POA: Diagnosis not present

## 2023-10-10 DIAGNOSIS — Z992 Dependence on renal dialysis: Secondary | ICD-10-CM | POA: Diagnosis not present

## 2023-10-10 DIAGNOSIS — N186 End stage renal disease: Secondary | ICD-10-CM | POA: Diagnosis not present

## 2023-10-10 DIAGNOSIS — D509 Iron deficiency anemia, unspecified: Secondary | ICD-10-CM | POA: Diagnosis not present

## 2023-10-10 DIAGNOSIS — E1122 Type 2 diabetes mellitus with diabetic chronic kidney disease: Secondary | ICD-10-CM | POA: Diagnosis not present

## 2023-10-12 DIAGNOSIS — N2581 Secondary hyperparathyroidism of renal origin: Secondary | ICD-10-CM | POA: Diagnosis not present

## 2023-10-12 DIAGNOSIS — Z992 Dependence on renal dialysis: Secondary | ICD-10-CM | POA: Diagnosis not present

## 2023-10-12 DIAGNOSIS — R519 Headache, unspecified: Secondary | ICD-10-CM | POA: Diagnosis not present

## 2023-10-12 DIAGNOSIS — E1122 Type 2 diabetes mellitus with diabetic chronic kidney disease: Secondary | ICD-10-CM | POA: Diagnosis not present

## 2023-10-12 DIAGNOSIS — D689 Coagulation defect, unspecified: Secondary | ICD-10-CM | POA: Diagnosis not present

## 2023-10-12 DIAGNOSIS — D509 Iron deficiency anemia, unspecified: Secondary | ICD-10-CM | POA: Diagnosis not present

## 2023-10-12 DIAGNOSIS — D631 Anemia in chronic kidney disease: Secondary | ICD-10-CM | POA: Diagnosis not present

## 2023-10-12 DIAGNOSIS — N186 End stage renal disease: Secondary | ICD-10-CM | POA: Diagnosis not present

## 2023-10-14 DIAGNOSIS — N186 End stage renal disease: Secondary | ICD-10-CM | POA: Diagnosis not present

## 2023-10-14 DIAGNOSIS — D631 Anemia in chronic kidney disease: Secondary | ICD-10-CM | POA: Diagnosis not present

## 2023-10-14 DIAGNOSIS — D689 Coagulation defect, unspecified: Secondary | ICD-10-CM | POA: Diagnosis not present

## 2023-10-14 DIAGNOSIS — R519 Headache, unspecified: Secondary | ICD-10-CM | POA: Diagnosis not present

## 2023-10-14 DIAGNOSIS — E1122 Type 2 diabetes mellitus with diabetic chronic kidney disease: Secondary | ICD-10-CM | POA: Diagnosis not present

## 2023-10-14 DIAGNOSIS — D509 Iron deficiency anemia, unspecified: Secondary | ICD-10-CM | POA: Diagnosis not present

## 2023-10-14 DIAGNOSIS — N2581 Secondary hyperparathyroidism of renal origin: Secondary | ICD-10-CM | POA: Diagnosis not present

## 2023-10-14 DIAGNOSIS — Z992 Dependence on renal dialysis: Secondary | ICD-10-CM | POA: Diagnosis not present

## 2023-10-15 DIAGNOSIS — Z992 Dependence on renal dialysis: Secondary | ICD-10-CM | POA: Diagnosis not present

## 2023-10-15 DIAGNOSIS — E1122 Type 2 diabetes mellitus with diabetic chronic kidney disease: Secondary | ICD-10-CM | POA: Diagnosis not present

## 2023-10-15 DIAGNOSIS — N186 End stage renal disease: Secondary | ICD-10-CM | POA: Diagnosis not present

## 2023-10-17 DIAGNOSIS — E1122 Type 2 diabetes mellitus with diabetic chronic kidney disease: Secondary | ICD-10-CM | POA: Diagnosis not present

## 2023-10-17 DIAGNOSIS — N186 End stage renal disease: Secondary | ICD-10-CM | POA: Diagnosis not present

## 2023-10-17 DIAGNOSIS — D631 Anemia in chronic kidney disease: Secondary | ICD-10-CM | POA: Diagnosis not present

## 2023-10-17 DIAGNOSIS — D509 Iron deficiency anemia, unspecified: Secondary | ICD-10-CM | POA: Diagnosis not present

## 2023-10-17 DIAGNOSIS — L299 Pruritus, unspecified: Secondary | ICD-10-CM | POA: Diagnosis not present

## 2023-10-17 DIAGNOSIS — D689 Coagulation defect, unspecified: Secondary | ICD-10-CM | POA: Diagnosis not present

## 2023-10-17 DIAGNOSIS — Z992 Dependence on renal dialysis: Secondary | ICD-10-CM | POA: Diagnosis not present

## 2023-10-17 DIAGNOSIS — N2581 Secondary hyperparathyroidism of renal origin: Secondary | ICD-10-CM | POA: Diagnosis not present

## 2023-10-19 DIAGNOSIS — E1122 Type 2 diabetes mellitus with diabetic chronic kidney disease: Secondary | ICD-10-CM | POA: Diagnosis not present

## 2023-10-19 DIAGNOSIS — Z992 Dependence on renal dialysis: Secondary | ICD-10-CM | POA: Diagnosis not present

## 2023-10-19 DIAGNOSIS — L299 Pruritus, unspecified: Secondary | ICD-10-CM | POA: Diagnosis not present

## 2023-10-19 DIAGNOSIS — N2581 Secondary hyperparathyroidism of renal origin: Secondary | ICD-10-CM | POA: Diagnosis not present

## 2023-10-19 DIAGNOSIS — D689 Coagulation defect, unspecified: Secondary | ICD-10-CM | POA: Diagnosis not present

## 2023-10-19 DIAGNOSIS — D631 Anemia in chronic kidney disease: Secondary | ICD-10-CM | POA: Diagnosis not present

## 2023-10-19 DIAGNOSIS — D509 Iron deficiency anemia, unspecified: Secondary | ICD-10-CM | POA: Diagnosis not present

## 2023-10-19 DIAGNOSIS — N186 End stage renal disease: Secondary | ICD-10-CM | POA: Diagnosis not present

## 2023-10-21 DIAGNOSIS — N186 End stage renal disease: Secondary | ICD-10-CM | POA: Diagnosis not present

## 2023-10-21 DIAGNOSIS — D689 Coagulation defect, unspecified: Secondary | ICD-10-CM | POA: Diagnosis not present

## 2023-10-21 DIAGNOSIS — L299 Pruritus, unspecified: Secondary | ICD-10-CM | POA: Diagnosis not present

## 2023-10-21 DIAGNOSIS — D509 Iron deficiency anemia, unspecified: Secondary | ICD-10-CM | POA: Diagnosis not present

## 2023-10-21 DIAGNOSIS — N2581 Secondary hyperparathyroidism of renal origin: Secondary | ICD-10-CM | POA: Diagnosis not present

## 2023-10-21 DIAGNOSIS — E1122 Type 2 diabetes mellitus with diabetic chronic kidney disease: Secondary | ICD-10-CM | POA: Diagnosis not present

## 2023-10-21 DIAGNOSIS — D631 Anemia in chronic kidney disease: Secondary | ICD-10-CM | POA: Diagnosis not present

## 2023-10-21 DIAGNOSIS — Z992 Dependence on renal dialysis: Secondary | ICD-10-CM | POA: Diagnosis not present

## 2023-10-24 DIAGNOSIS — Z03818 Encounter for observation for suspected exposure to other biological agents ruled out: Secondary | ICD-10-CM | POA: Diagnosis not present

## 2023-10-24 DIAGNOSIS — R030 Elevated blood-pressure reading, without diagnosis of hypertension: Secondary | ICD-10-CM | POA: Diagnosis not present

## 2023-10-24 DIAGNOSIS — J029 Acute pharyngitis, unspecified: Secondary | ICD-10-CM | POA: Diagnosis not present

## 2023-10-24 DIAGNOSIS — R110 Nausea: Secondary | ICD-10-CM | POA: Diagnosis not present

## 2023-10-26 DIAGNOSIS — D689 Coagulation defect, unspecified: Secondary | ICD-10-CM | POA: Diagnosis not present

## 2023-10-26 DIAGNOSIS — N2581 Secondary hyperparathyroidism of renal origin: Secondary | ICD-10-CM | POA: Diagnosis not present

## 2023-10-26 DIAGNOSIS — D631 Anemia in chronic kidney disease: Secondary | ICD-10-CM | POA: Diagnosis not present

## 2023-10-26 DIAGNOSIS — L299 Pruritus, unspecified: Secondary | ICD-10-CM | POA: Diagnosis not present

## 2023-10-26 DIAGNOSIS — N186 End stage renal disease: Secondary | ICD-10-CM | POA: Diagnosis not present

## 2023-10-26 DIAGNOSIS — E1122 Type 2 diabetes mellitus with diabetic chronic kidney disease: Secondary | ICD-10-CM | POA: Diagnosis not present

## 2023-10-26 DIAGNOSIS — Z992 Dependence on renal dialysis: Secondary | ICD-10-CM | POA: Diagnosis not present

## 2023-10-26 DIAGNOSIS — D509 Iron deficiency anemia, unspecified: Secondary | ICD-10-CM | POA: Diagnosis not present

## 2023-10-27 DIAGNOSIS — M533 Sacrococcygeal disorders, not elsewhere classified: Secondary | ICD-10-CM | POA: Diagnosis not present

## 2023-10-27 DIAGNOSIS — Z79899 Other long term (current) drug therapy: Secondary | ICD-10-CM | POA: Diagnosis not present

## 2023-10-27 DIAGNOSIS — I252 Old myocardial infarction: Secondary | ICD-10-CM | POA: Diagnosis not present

## 2023-10-27 DIAGNOSIS — Z7902 Long term (current) use of antithrombotics/antiplatelets: Secondary | ICD-10-CM | POA: Diagnosis not present

## 2023-10-27 DIAGNOSIS — Z7985 Long-term (current) use of injectable non-insulin antidiabetic drugs: Secondary | ICD-10-CM | POA: Diagnosis not present

## 2023-10-27 DIAGNOSIS — E1122 Type 2 diabetes mellitus with diabetic chronic kidney disease: Secondary | ICD-10-CM | POA: Diagnosis not present

## 2023-10-27 DIAGNOSIS — Z8673 Personal history of transient ischemic attack (TIA), and cerebral infarction without residual deficits: Secondary | ICD-10-CM | POA: Diagnosis not present

## 2023-10-27 DIAGNOSIS — Z992 Dependence on renal dialysis: Secondary | ICD-10-CM | POA: Diagnosis not present

## 2023-10-27 DIAGNOSIS — Z794 Long term (current) use of insulin: Secondary | ICD-10-CM | POA: Diagnosis not present

## 2023-10-27 DIAGNOSIS — N186 End stage renal disease: Secondary | ICD-10-CM | POA: Diagnosis not present

## 2023-10-27 DIAGNOSIS — Z951 Presence of aortocoronary bypass graft: Secondary | ICD-10-CM | POA: Diagnosis not present

## 2023-10-27 DIAGNOSIS — I120 Hypertensive chronic kidney disease with stage 5 chronic kidney disease or end stage renal disease: Secondary | ICD-10-CM | POA: Diagnosis not present

## 2023-10-28 DIAGNOSIS — N186 End stage renal disease: Secondary | ICD-10-CM | POA: Diagnosis not present

## 2023-10-28 DIAGNOSIS — D509 Iron deficiency anemia, unspecified: Secondary | ICD-10-CM | POA: Diagnosis not present

## 2023-10-28 DIAGNOSIS — D631 Anemia in chronic kidney disease: Secondary | ICD-10-CM | POA: Diagnosis not present

## 2023-10-28 DIAGNOSIS — D689 Coagulation defect, unspecified: Secondary | ICD-10-CM | POA: Diagnosis not present

## 2023-10-28 DIAGNOSIS — Z992 Dependence on renal dialysis: Secondary | ICD-10-CM | POA: Diagnosis not present

## 2023-10-28 DIAGNOSIS — N2581 Secondary hyperparathyroidism of renal origin: Secondary | ICD-10-CM | POA: Diagnosis not present

## 2023-10-28 DIAGNOSIS — E1122 Type 2 diabetes mellitus with diabetic chronic kidney disease: Secondary | ICD-10-CM | POA: Diagnosis not present

## 2023-10-28 DIAGNOSIS — L299 Pruritus, unspecified: Secondary | ICD-10-CM | POA: Diagnosis not present

## 2023-10-31 DIAGNOSIS — D631 Anemia in chronic kidney disease: Secondary | ICD-10-CM | POA: Diagnosis not present

## 2023-10-31 DIAGNOSIS — N186 End stage renal disease: Secondary | ICD-10-CM | POA: Diagnosis not present

## 2023-10-31 DIAGNOSIS — L299 Pruritus, unspecified: Secondary | ICD-10-CM | POA: Diagnosis not present

## 2023-10-31 DIAGNOSIS — D509 Iron deficiency anemia, unspecified: Secondary | ICD-10-CM | POA: Diagnosis not present

## 2023-10-31 DIAGNOSIS — Z992 Dependence on renal dialysis: Secondary | ICD-10-CM | POA: Diagnosis not present

## 2023-10-31 DIAGNOSIS — N2581 Secondary hyperparathyroidism of renal origin: Secondary | ICD-10-CM | POA: Diagnosis not present

## 2023-10-31 DIAGNOSIS — E1122 Type 2 diabetes mellitus with diabetic chronic kidney disease: Secondary | ICD-10-CM | POA: Diagnosis not present

## 2023-10-31 DIAGNOSIS — D689 Coagulation defect, unspecified: Secondary | ICD-10-CM | POA: Diagnosis not present

## 2023-11-02 DIAGNOSIS — Z992 Dependence on renal dialysis: Secondary | ICD-10-CM | POA: Diagnosis not present

## 2023-11-02 DIAGNOSIS — E1122 Type 2 diabetes mellitus with diabetic chronic kidney disease: Secondary | ICD-10-CM | POA: Diagnosis not present

## 2023-11-02 DIAGNOSIS — D631 Anemia in chronic kidney disease: Secondary | ICD-10-CM | POA: Diagnosis not present

## 2023-11-02 DIAGNOSIS — N186 End stage renal disease: Secondary | ICD-10-CM | POA: Diagnosis not present

## 2023-11-02 DIAGNOSIS — D689 Coagulation defect, unspecified: Secondary | ICD-10-CM | POA: Diagnosis not present

## 2023-11-02 DIAGNOSIS — L299 Pruritus, unspecified: Secondary | ICD-10-CM | POA: Diagnosis not present

## 2023-11-02 DIAGNOSIS — D509 Iron deficiency anemia, unspecified: Secondary | ICD-10-CM | POA: Diagnosis not present

## 2023-11-02 DIAGNOSIS — N2581 Secondary hyperparathyroidism of renal origin: Secondary | ICD-10-CM | POA: Diagnosis not present

## 2023-11-04 DIAGNOSIS — D631 Anemia in chronic kidney disease: Secondary | ICD-10-CM | POA: Diagnosis not present

## 2023-11-04 DIAGNOSIS — N186 End stage renal disease: Secondary | ICD-10-CM | POA: Diagnosis not present

## 2023-11-04 DIAGNOSIS — D689 Coagulation defect, unspecified: Secondary | ICD-10-CM | POA: Diagnosis not present

## 2023-11-04 DIAGNOSIS — Z992 Dependence on renal dialysis: Secondary | ICD-10-CM | POA: Diagnosis not present

## 2023-11-04 DIAGNOSIS — D509 Iron deficiency anemia, unspecified: Secondary | ICD-10-CM | POA: Diagnosis not present

## 2023-11-04 DIAGNOSIS — E1122 Type 2 diabetes mellitus with diabetic chronic kidney disease: Secondary | ICD-10-CM | POA: Diagnosis not present

## 2023-11-04 DIAGNOSIS — N2581 Secondary hyperparathyroidism of renal origin: Secondary | ICD-10-CM | POA: Diagnosis not present

## 2023-11-04 DIAGNOSIS — L299 Pruritus, unspecified: Secondary | ICD-10-CM | POA: Diagnosis not present

## 2023-11-07 DIAGNOSIS — D631 Anemia in chronic kidney disease: Secondary | ICD-10-CM | POA: Diagnosis not present

## 2023-11-07 DIAGNOSIS — E1122 Type 2 diabetes mellitus with diabetic chronic kidney disease: Secondary | ICD-10-CM | POA: Diagnosis not present

## 2023-11-07 DIAGNOSIS — D509 Iron deficiency anemia, unspecified: Secondary | ICD-10-CM | POA: Diagnosis not present

## 2023-11-07 DIAGNOSIS — N186 End stage renal disease: Secondary | ICD-10-CM | POA: Diagnosis not present

## 2023-11-07 DIAGNOSIS — Z992 Dependence on renal dialysis: Secondary | ICD-10-CM | POA: Diagnosis not present

## 2023-11-07 DIAGNOSIS — L299 Pruritus, unspecified: Secondary | ICD-10-CM | POA: Diagnosis not present

## 2023-11-07 DIAGNOSIS — D689 Coagulation defect, unspecified: Secondary | ICD-10-CM | POA: Diagnosis not present

## 2023-11-07 DIAGNOSIS — N2581 Secondary hyperparathyroidism of renal origin: Secondary | ICD-10-CM | POA: Diagnosis not present

## 2023-11-09 DIAGNOSIS — D689 Coagulation defect, unspecified: Secondary | ICD-10-CM | POA: Diagnosis not present

## 2023-11-09 DIAGNOSIS — N2581 Secondary hyperparathyroidism of renal origin: Secondary | ICD-10-CM | POA: Diagnosis not present

## 2023-11-09 DIAGNOSIS — E1122 Type 2 diabetes mellitus with diabetic chronic kidney disease: Secondary | ICD-10-CM | POA: Diagnosis not present

## 2023-11-09 DIAGNOSIS — L299 Pruritus, unspecified: Secondary | ICD-10-CM | POA: Diagnosis not present

## 2023-11-09 DIAGNOSIS — N186 End stage renal disease: Secondary | ICD-10-CM | POA: Diagnosis not present

## 2023-11-09 DIAGNOSIS — D509 Iron deficiency anemia, unspecified: Secondary | ICD-10-CM | POA: Diagnosis not present

## 2023-11-09 DIAGNOSIS — Z992 Dependence on renal dialysis: Secondary | ICD-10-CM | POA: Diagnosis not present

## 2023-11-09 DIAGNOSIS — D631 Anemia in chronic kidney disease: Secondary | ICD-10-CM | POA: Diagnosis not present

## 2023-11-11 DIAGNOSIS — D689 Coagulation defect, unspecified: Secondary | ICD-10-CM | POA: Diagnosis not present

## 2023-11-11 DIAGNOSIS — L299 Pruritus, unspecified: Secondary | ICD-10-CM | POA: Diagnosis not present

## 2023-11-11 DIAGNOSIS — N2581 Secondary hyperparathyroidism of renal origin: Secondary | ICD-10-CM | POA: Diagnosis not present

## 2023-11-11 DIAGNOSIS — D509 Iron deficiency anemia, unspecified: Secondary | ICD-10-CM | POA: Diagnosis not present

## 2023-11-11 DIAGNOSIS — E1122 Type 2 diabetes mellitus with diabetic chronic kidney disease: Secondary | ICD-10-CM | POA: Diagnosis not present

## 2023-11-11 DIAGNOSIS — N186 End stage renal disease: Secondary | ICD-10-CM | POA: Diagnosis not present

## 2023-11-11 DIAGNOSIS — Z992 Dependence on renal dialysis: Secondary | ICD-10-CM | POA: Diagnosis not present

## 2023-11-11 DIAGNOSIS — D631 Anemia in chronic kidney disease: Secondary | ICD-10-CM | POA: Diagnosis not present

## 2023-11-14 DIAGNOSIS — N2581 Secondary hyperparathyroidism of renal origin: Secondary | ICD-10-CM | POA: Diagnosis not present

## 2023-11-14 DIAGNOSIS — D631 Anemia in chronic kidney disease: Secondary | ICD-10-CM | POA: Diagnosis not present

## 2023-11-14 DIAGNOSIS — E1022 Type 1 diabetes mellitus with diabetic chronic kidney disease: Secondary | ICD-10-CM | POA: Diagnosis not present

## 2023-11-14 DIAGNOSIS — D509 Iron deficiency anemia, unspecified: Secondary | ICD-10-CM | POA: Diagnosis not present

## 2023-11-14 DIAGNOSIS — N186 End stage renal disease: Secondary | ICD-10-CM | POA: Diagnosis not present

## 2023-11-14 DIAGNOSIS — L299 Pruritus, unspecified: Secondary | ICD-10-CM | POA: Diagnosis not present

## 2023-11-14 DIAGNOSIS — E1122 Type 2 diabetes mellitus with diabetic chronic kidney disease: Secondary | ICD-10-CM | POA: Diagnosis not present

## 2023-11-14 DIAGNOSIS — Z992 Dependence on renal dialysis: Secondary | ICD-10-CM | POA: Diagnosis not present

## 2023-11-14 DIAGNOSIS — D689 Coagulation defect, unspecified: Secondary | ICD-10-CM | POA: Diagnosis not present

## 2023-11-16 DIAGNOSIS — D689 Coagulation defect, unspecified: Secondary | ICD-10-CM | POA: Diagnosis not present

## 2023-11-16 DIAGNOSIS — Z992 Dependence on renal dialysis: Secondary | ICD-10-CM | POA: Diagnosis not present

## 2023-11-16 DIAGNOSIS — D631 Anemia in chronic kidney disease: Secondary | ICD-10-CM | POA: Diagnosis not present

## 2023-11-16 DIAGNOSIS — N2581 Secondary hyperparathyroidism of renal origin: Secondary | ICD-10-CM | POA: Diagnosis not present

## 2023-11-16 DIAGNOSIS — E1122 Type 2 diabetes mellitus with diabetic chronic kidney disease: Secondary | ICD-10-CM | POA: Diagnosis not present

## 2023-11-16 DIAGNOSIS — D509 Iron deficiency anemia, unspecified: Secondary | ICD-10-CM | POA: Diagnosis not present

## 2023-11-16 DIAGNOSIS — N186 End stage renal disease: Secondary | ICD-10-CM | POA: Diagnosis not present

## 2023-11-18 DIAGNOSIS — N186 End stage renal disease: Secondary | ICD-10-CM | POA: Diagnosis not present

## 2023-11-18 DIAGNOSIS — D509 Iron deficiency anemia, unspecified: Secondary | ICD-10-CM | POA: Diagnosis not present

## 2023-11-18 DIAGNOSIS — Z992 Dependence on renal dialysis: Secondary | ICD-10-CM | POA: Diagnosis not present

## 2023-11-18 DIAGNOSIS — D631 Anemia in chronic kidney disease: Secondary | ICD-10-CM | POA: Diagnosis not present

## 2023-11-18 DIAGNOSIS — E1122 Type 2 diabetes mellitus with diabetic chronic kidney disease: Secondary | ICD-10-CM | POA: Diagnosis not present

## 2023-11-18 DIAGNOSIS — N2581 Secondary hyperparathyroidism of renal origin: Secondary | ICD-10-CM | POA: Diagnosis not present

## 2023-11-18 DIAGNOSIS — D689 Coagulation defect, unspecified: Secondary | ICD-10-CM | POA: Diagnosis not present

## 2023-11-21 DIAGNOSIS — N2581 Secondary hyperparathyroidism of renal origin: Secondary | ICD-10-CM | POA: Diagnosis not present

## 2023-11-21 DIAGNOSIS — N186 End stage renal disease: Secondary | ICD-10-CM | POA: Diagnosis not present

## 2023-11-21 DIAGNOSIS — D631 Anemia in chronic kidney disease: Secondary | ICD-10-CM | POA: Diagnosis not present

## 2023-11-21 DIAGNOSIS — D689 Coagulation defect, unspecified: Secondary | ICD-10-CM | POA: Diagnosis not present

## 2023-11-21 DIAGNOSIS — Z992 Dependence on renal dialysis: Secondary | ICD-10-CM | POA: Diagnosis not present

## 2023-11-21 DIAGNOSIS — E1122 Type 2 diabetes mellitus with diabetic chronic kidney disease: Secondary | ICD-10-CM | POA: Diagnosis not present

## 2023-11-21 DIAGNOSIS — D509 Iron deficiency anemia, unspecified: Secondary | ICD-10-CM | POA: Diagnosis not present

## 2023-11-23 DIAGNOSIS — D509 Iron deficiency anemia, unspecified: Secondary | ICD-10-CM | POA: Diagnosis not present

## 2023-11-23 DIAGNOSIS — D689 Coagulation defect, unspecified: Secondary | ICD-10-CM | POA: Diagnosis not present

## 2023-11-23 DIAGNOSIS — N186 End stage renal disease: Secondary | ICD-10-CM | POA: Diagnosis not present

## 2023-11-23 DIAGNOSIS — E1122 Type 2 diabetes mellitus with diabetic chronic kidney disease: Secondary | ICD-10-CM | POA: Diagnosis not present

## 2023-11-23 DIAGNOSIS — D631 Anemia in chronic kidney disease: Secondary | ICD-10-CM | POA: Diagnosis not present

## 2023-11-23 DIAGNOSIS — Z992 Dependence on renal dialysis: Secondary | ICD-10-CM | POA: Diagnosis not present

## 2023-11-23 DIAGNOSIS — N2581 Secondary hyperparathyroidism of renal origin: Secondary | ICD-10-CM | POA: Diagnosis not present

## 2023-11-24 DIAGNOSIS — G35 Multiple sclerosis: Secondary | ICD-10-CM | POA: Diagnosis not present

## 2023-11-24 DIAGNOSIS — E785 Hyperlipidemia, unspecified: Secondary | ICD-10-CM | POA: Diagnosis not present

## 2023-11-24 DIAGNOSIS — N186 End stage renal disease: Secondary | ICD-10-CM | POA: Diagnosis not present

## 2023-11-24 DIAGNOSIS — I700 Atherosclerosis of aorta: Secondary | ICD-10-CM | POA: Diagnosis not present

## 2023-11-24 DIAGNOSIS — I69351 Hemiplegia and hemiparesis following cerebral infarction affecting right dominant side: Secondary | ICD-10-CM | POA: Diagnosis not present

## 2023-11-24 DIAGNOSIS — I10 Essential (primary) hypertension: Secondary | ICD-10-CM | POA: Diagnosis not present

## 2023-11-24 DIAGNOSIS — E1165 Type 2 diabetes mellitus with hyperglycemia: Secondary | ICD-10-CM | POA: Diagnosis not present

## 2023-11-25 DIAGNOSIS — D689 Coagulation defect, unspecified: Secondary | ICD-10-CM | POA: Diagnosis not present

## 2023-11-25 DIAGNOSIS — Z992 Dependence on renal dialysis: Secondary | ICD-10-CM | POA: Diagnosis not present

## 2023-11-25 DIAGNOSIS — N186 End stage renal disease: Secondary | ICD-10-CM | POA: Diagnosis not present

## 2023-11-25 DIAGNOSIS — D509 Iron deficiency anemia, unspecified: Secondary | ICD-10-CM | POA: Diagnosis not present

## 2023-11-25 DIAGNOSIS — N2581 Secondary hyperparathyroidism of renal origin: Secondary | ICD-10-CM | POA: Diagnosis not present

## 2023-11-25 DIAGNOSIS — D631 Anemia in chronic kidney disease: Secondary | ICD-10-CM | POA: Diagnosis not present

## 2023-11-25 DIAGNOSIS — E1122 Type 2 diabetes mellitus with diabetic chronic kidney disease: Secondary | ICD-10-CM | POA: Diagnosis not present

## 2023-11-28 DIAGNOSIS — D689 Coagulation defect, unspecified: Secondary | ICD-10-CM | POA: Diagnosis not present

## 2023-11-28 DIAGNOSIS — E1122 Type 2 diabetes mellitus with diabetic chronic kidney disease: Secondary | ICD-10-CM | POA: Diagnosis not present

## 2023-11-28 DIAGNOSIS — D509 Iron deficiency anemia, unspecified: Secondary | ICD-10-CM | POA: Diagnosis not present

## 2023-11-28 DIAGNOSIS — Z992 Dependence on renal dialysis: Secondary | ICD-10-CM | POA: Diagnosis not present

## 2023-11-28 DIAGNOSIS — N186 End stage renal disease: Secondary | ICD-10-CM | POA: Diagnosis not present

## 2023-11-28 DIAGNOSIS — N2581 Secondary hyperparathyroidism of renal origin: Secondary | ICD-10-CM | POA: Diagnosis not present

## 2023-11-28 DIAGNOSIS — D631 Anemia in chronic kidney disease: Secondary | ICD-10-CM | POA: Diagnosis not present

## 2023-11-30 DIAGNOSIS — D631 Anemia in chronic kidney disease: Secondary | ICD-10-CM | POA: Diagnosis not present

## 2023-11-30 DIAGNOSIS — N186 End stage renal disease: Secondary | ICD-10-CM | POA: Diagnosis not present

## 2023-11-30 DIAGNOSIS — N2581 Secondary hyperparathyroidism of renal origin: Secondary | ICD-10-CM | POA: Diagnosis not present

## 2023-11-30 DIAGNOSIS — Z992 Dependence on renal dialysis: Secondary | ICD-10-CM | POA: Diagnosis not present

## 2023-11-30 DIAGNOSIS — D689 Coagulation defect, unspecified: Secondary | ICD-10-CM | POA: Diagnosis not present

## 2023-11-30 DIAGNOSIS — D509 Iron deficiency anemia, unspecified: Secondary | ICD-10-CM | POA: Diagnosis not present

## 2023-11-30 DIAGNOSIS — E1122 Type 2 diabetes mellitus with diabetic chronic kidney disease: Secondary | ICD-10-CM | POA: Diagnosis not present

## 2023-12-02 DIAGNOSIS — D509 Iron deficiency anemia, unspecified: Secondary | ICD-10-CM | POA: Diagnosis not present

## 2023-12-02 DIAGNOSIS — N2581 Secondary hyperparathyroidism of renal origin: Secondary | ICD-10-CM | POA: Diagnosis not present

## 2023-12-02 DIAGNOSIS — D631 Anemia in chronic kidney disease: Secondary | ICD-10-CM | POA: Diagnosis not present

## 2023-12-02 DIAGNOSIS — N186 End stage renal disease: Secondary | ICD-10-CM | POA: Diagnosis not present

## 2023-12-02 DIAGNOSIS — E1122 Type 2 diabetes mellitus with diabetic chronic kidney disease: Secondary | ICD-10-CM | POA: Diagnosis not present

## 2023-12-02 DIAGNOSIS — D689 Coagulation defect, unspecified: Secondary | ICD-10-CM | POA: Diagnosis not present

## 2023-12-02 DIAGNOSIS — Z992 Dependence on renal dialysis: Secondary | ICD-10-CM | POA: Diagnosis not present

## 2023-12-05 DIAGNOSIS — N2581 Secondary hyperparathyroidism of renal origin: Secondary | ICD-10-CM | POA: Diagnosis not present

## 2023-12-05 DIAGNOSIS — E1122 Type 2 diabetes mellitus with diabetic chronic kidney disease: Secondary | ICD-10-CM | POA: Diagnosis not present

## 2023-12-05 DIAGNOSIS — N186 End stage renal disease: Secondary | ICD-10-CM | POA: Diagnosis not present

## 2023-12-05 DIAGNOSIS — D509 Iron deficiency anemia, unspecified: Secondary | ICD-10-CM | POA: Diagnosis not present

## 2023-12-05 DIAGNOSIS — Z992 Dependence on renal dialysis: Secondary | ICD-10-CM | POA: Diagnosis not present

## 2023-12-05 DIAGNOSIS — D631 Anemia in chronic kidney disease: Secondary | ICD-10-CM | POA: Diagnosis not present

## 2023-12-05 DIAGNOSIS — D689 Coagulation defect, unspecified: Secondary | ICD-10-CM | POA: Diagnosis not present

## 2023-12-07 DIAGNOSIS — D689 Coagulation defect, unspecified: Secondary | ICD-10-CM | POA: Diagnosis not present

## 2023-12-07 DIAGNOSIS — D631 Anemia in chronic kidney disease: Secondary | ICD-10-CM | POA: Diagnosis not present

## 2023-12-07 DIAGNOSIS — N2581 Secondary hyperparathyroidism of renal origin: Secondary | ICD-10-CM | POA: Diagnosis not present

## 2023-12-07 DIAGNOSIS — Z992 Dependence on renal dialysis: Secondary | ICD-10-CM | POA: Diagnosis not present

## 2023-12-07 DIAGNOSIS — N186 End stage renal disease: Secondary | ICD-10-CM | POA: Diagnosis not present

## 2023-12-07 DIAGNOSIS — D509 Iron deficiency anemia, unspecified: Secondary | ICD-10-CM | POA: Diagnosis not present

## 2023-12-07 DIAGNOSIS — E1122 Type 2 diabetes mellitus with diabetic chronic kidney disease: Secondary | ICD-10-CM | POA: Diagnosis not present

## 2023-12-09 DIAGNOSIS — D689 Coagulation defect, unspecified: Secondary | ICD-10-CM | POA: Diagnosis not present

## 2023-12-09 DIAGNOSIS — N186 End stage renal disease: Secondary | ICD-10-CM | POA: Diagnosis not present

## 2023-12-09 DIAGNOSIS — D631 Anemia in chronic kidney disease: Secondary | ICD-10-CM | POA: Diagnosis not present

## 2023-12-09 DIAGNOSIS — N2581 Secondary hyperparathyroidism of renal origin: Secondary | ICD-10-CM | POA: Diagnosis not present

## 2023-12-09 DIAGNOSIS — E1122 Type 2 diabetes mellitus with diabetic chronic kidney disease: Secondary | ICD-10-CM | POA: Diagnosis not present

## 2023-12-09 DIAGNOSIS — D509 Iron deficiency anemia, unspecified: Secondary | ICD-10-CM | POA: Diagnosis not present

## 2023-12-09 DIAGNOSIS — Z992 Dependence on renal dialysis: Secondary | ICD-10-CM | POA: Diagnosis not present

## 2023-12-12 DIAGNOSIS — E1122 Type 2 diabetes mellitus with diabetic chronic kidney disease: Secondary | ICD-10-CM | POA: Diagnosis not present

## 2023-12-12 DIAGNOSIS — D631 Anemia in chronic kidney disease: Secondary | ICD-10-CM | POA: Diagnosis not present

## 2023-12-12 DIAGNOSIS — N186 End stage renal disease: Secondary | ICD-10-CM | POA: Diagnosis not present

## 2023-12-12 DIAGNOSIS — Z992 Dependence on renal dialysis: Secondary | ICD-10-CM | POA: Diagnosis not present

## 2023-12-12 DIAGNOSIS — D509 Iron deficiency anemia, unspecified: Secondary | ICD-10-CM | POA: Diagnosis not present

## 2023-12-12 DIAGNOSIS — D689 Coagulation defect, unspecified: Secondary | ICD-10-CM | POA: Diagnosis not present

## 2023-12-12 DIAGNOSIS — N2581 Secondary hyperparathyroidism of renal origin: Secondary | ICD-10-CM | POA: Diagnosis not present

## 2023-12-14 DIAGNOSIS — N186 End stage renal disease: Secondary | ICD-10-CM | POA: Diagnosis not present

## 2023-12-14 DIAGNOSIS — E1122 Type 2 diabetes mellitus with diabetic chronic kidney disease: Secondary | ICD-10-CM | POA: Diagnosis not present

## 2023-12-14 DIAGNOSIS — Z992 Dependence on renal dialysis: Secondary | ICD-10-CM | POA: Diagnosis not present

## 2023-12-14 DIAGNOSIS — D509 Iron deficiency anemia, unspecified: Secondary | ICD-10-CM | POA: Diagnosis not present

## 2023-12-14 DIAGNOSIS — D689 Coagulation defect, unspecified: Secondary | ICD-10-CM | POA: Diagnosis not present

## 2023-12-14 DIAGNOSIS — D631 Anemia in chronic kidney disease: Secondary | ICD-10-CM | POA: Diagnosis not present

## 2023-12-14 DIAGNOSIS — N2581 Secondary hyperparathyroidism of renal origin: Secondary | ICD-10-CM | POA: Diagnosis not present

## 2023-12-15 DIAGNOSIS — E1022 Type 1 diabetes mellitus with diabetic chronic kidney disease: Secondary | ICD-10-CM | POA: Diagnosis not present

## 2023-12-15 DIAGNOSIS — N186 End stage renal disease: Secondary | ICD-10-CM | POA: Diagnosis not present

## 2023-12-15 DIAGNOSIS — Z992 Dependence on renal dialysis: Secondary | ICD-10-CM | POA: Diagnosis not present

## 2023-12-19 DIAGNOSIS — Z992 Dependence on renal dialysis: Secondary | ICD-10-CM | POA: Diagnosis not present

## 2023-12-19 DIAGNOSIS — N2581 Secondary hyperparathyroidism of renal origin: Secondary | ICD-10-CM | POA: Diagnosis not present

## 2023-12-19 DIAGNOSIS — D689 Coagulation defect, unspecified: Secondary | ICD-10-CM | POA: Diagnosis not present

## 2023-12-19 DIAGNOSIS — N186 End stage renal disease: Secondary | ICD-10-CM | POA: Diagnosis not present

## 2023-12-19 DIAGNOSIS — E1122 Type 2 diabetes mellitus with diabetic chronic kidney disease: Secondary | ICD-10-CM | POA: Diagnosis not present

## 2023-12-19 DIAGNOSIS — D631 Anemia in chronic kidney disease: Secondary | ICD-10-CM | POA: Diagnosis not present

## 2023-12-19 DIAGNOSIS — D509 Iron deficiency anemia, unspecified: Secondary | ICD-10-CM | POA: Diagnosis not present

## 2023-12-20 ENCOUNTER — Ambulatory Visit: Attending: Cardiology | Admitting: Cardiology

## 2023-12-20 VITALS — BP 138/78 | HR 85 | Ht 62.0 in | Wt 94.0 lb

## 2023-12-20 DIAGNOSIS — E1169 Type 2 diabetes mellitus with other specified complication: Secondary | ICD-10-CM | POA: Diagnosis not present

## 2023-12-20 DIAGNOSIS — E785 Hyperlipidemia, unspecified: Secondary | ICD-10-CM | POA: Diagnosis not present

## 2023-12-20 DIAGNOSIS — I10 Essential (primary) hypertension: Secondary | ICD-10-CM | POA: Diagnosis not present

## 2023-12-20 DIAGNOSIS — I252 Old myocardial infarction: Secondary | ICD-10-CM | POA: Diagnosis not present

## 2023-12-20 DIAGNOSIS — I2510 Atherosclerotic heart disease of native coronary artery without angina pectoris: Secondary | ICD-10-CM

## 2023-12-20 DIAGNOSIS — Z9861 Coronary angioplasty status: Secondary | ICD-10-CM

## 2023-12-20 MED ORDER — ISOSORBIDE MONONITRATE ER 30 MG PO TB24: 30.0000 mg | ORAL_TABLET | Freq: Every day | ORAL | 3 refills | Status: DC

## 2023-12-20 MED ORDER — HYDRALAZINE HCL 25 MG PO TABS: 25.0000 mg | ORAL_TABLET | Freq: Two times a day (BID) | ORAL | 3 refills | Status: DC

## 2023-12-20 NOTE — Progress Notes (Signed)
 Cardiology Office Note:  .   Date:  12/29/2023  ID:  Hannah [name], DOB Apr 03, 1966, MRN [id number] PCP: Delayne Artist PARAS, MD  [facility] Providers Cardiologist:  Alm Clay, MD     Chief Complaint  Patient presents with   Follow-up    18 months follow-up   Coronary Artery Disease    History of PCI x 2 in the setting of MI in 2009 -> no recurrent angina.  Not very active because of being wheelchair-bound.   Hypertension    Somewhat labile.  Very much determined by dialysis.    Patient Profile: .     Hannah [name] is a very unfortunate 58 y.o. 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  Past Medical History:   - CAD / MI - PCI x2 [place], [place] 2009); 01/2016 Nuc ST Nonischemic with Normal EF  Myoview  2017: Nonischemic; Echo May 2022: EF 65-70%.  Hyperdynamic.  No RWMA.  G1 DD.  Ventricular beats.  Elevated LVEDP/LAP.  Rectal pericardial effusion.  AOV sclerosis with no stenosis.  Sigmoid septum.  Stroke [facility]) - 2019; 2021 => now wheelchair-bound  Diabetes mellitus-2 on insulin  with ESRD and circulatory complications - ESRD on HD (started May 2022)  Hyperlipidemia   Hypertension   MS (multiple sclerosis) (RAF-HCC) - ?? Vs. 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  She was stable from prior standpoint no active chest pain.  Occasionally has chest pain during dialysis but blood pressure getting low.  A little bit of orthopnea over the weekend prior to dialysis but no real edema.  No PND.  No recurrent TIA or CVA symptoms.  Just feels worn out after dialysis.  Also notes dizziness after dialysis. => I adjusted medications such that she would take one half of her carvedilol  tablet on the night before dialysis and  then wait till after dialysis to take 1/2 tablet before taking the full dose in the evening after dialysis.  Also told to hold amlodipine  on dialysis days.  On Saturdays (still take carvedilol  TID 25 mg.     Hannah [name] was hospitalized May 9-13, 2025: Presented with worsening right hemiparesis and dysarthria after dialysis.  Negative for acute stroke.  Blood work suggested severe anemia with a hemoglobin of 5.7.  Apparently had been having lots of bleeding from her AV fistula. => Received 2 units of PRBC when improvement of symptoms back to baseline. Fistulogram with stent placement in the right innominate vein (09/26/2023) restarting Plavix  for outpatient maintenance.  Subjective  Discussed the use of AI scribe software for clinical note transcription with the patient, who gave verbal consent to proceed.  History of Present Illness  Hannah [name] is a 58 year old female with coronary artery disease, history of strokes end-stage renal disease on dialysis who presents with chest pain.  She is basically wheelchair-bound and very immobile with dense residual right hemiparesis and dysarthria.  She experiences chest pain primarily after dialysis sessions, sometimes requiring nitroglycerin  for relief. The episodes occur intermittently, with some happening close together, such as once and then again two weeks later. The frequency of chest pain has decreased in the last few months. During these episodes, she has been to the ER, where tests have shown no acute  issues.  She has been on dialysis since May 2022 due to end-stage renal disease. During dialysis, she sometimes experiences low blood pressure, though it has not been low enough to stop the procedure. Her blood pressure was high during her last dialysis session. She does not see her nephrologist in a clinic setting but is seen during dialysis sessions.  Her past medical history includes a heart attack and stroke in 2009, with a more recent stroke in  2021. She has been mostly wheelchair-bound since her stroke and reports limited physical activity. No swelling, shortness of breath, or palpitations. She has not had any pass-out spells.  Current medications include amlodipine  and carvedilol  for blood pressure management. She is also on Plavix  but not on iron supplements. She takes nitroglycerin  as needed for chest pain, approximately once a month.  She frequently feels cold and has experienced significant weight loss, though she is unsure of the exact amount. No issues with swelling or shortness of breath when lying flat.  Cardiovascular ROS: positive for - chest pain and intermittent episodes of chest pain during dialysis but no routine.  No chest pain otherwise.. negative for - edema, irregular heartbeat, orthopnea, palpitations, paroxysmal nocturnal dyspnea, rapid heart rate, shortness of breath, or Syncope or near syncope.  No residual CVA/TIA symptoms.    ROS:  Review of Systems - Negative except symptoms noted above.    Objective   Past Surgical History:   APPENDECTOMY   CARDIAC CATHETERIZATION w/  CORONARY STENT PLACEMENT x2 (2009)  Ovarian cyst removed   Medications CV: Plavix  75 mg daily; Crestor  20 mg Daily; amlodipine  10 mg daily; carvedilol  25 mg twice daily; as needed NTG SL 0.4 mg DM-2 Pepcid  20 mg daily lispro insulin  5 units 3 times daily; Tresiba  12 units nightly.  (Uses a freestyle libre 2 sensor) Azathioprine  50 mg daily for eye disease (retinitis) PhosLo  60 to 70 mg 3 times daily; MiraLAX  17 g daily Voltaren  gel 1% 4 times a day As needed Tylenol  500 mg; Reglan  5 mg every 6 hour as needed for vomiting/nausea; Zofran  4 mg every 8 hours as needed nausea; Senokot 1 tablet twice daily as needed constipation Emla  cream for access port   Studies Reviewed: SABRA        No recent cardiac studies Lab Results  Component Value Date   NA 137 09/24/2023   K 3.6 09/24/2023   CREATININE 3.62 (H) 09/24/2023   GFRNONAA 14 (L)  09/24/2023   GLUCOSE 92 09/24/2023   Lab Results  Component Value Date   CHOL 133 04/01/2022   HDL 80 04/01/2022   LDLCALC 41 04/01/2022   TRIG 52 04/01/2022   CHOLHDL 1.7 04/01/2022    Risk Assessment/Calculations:         Physical Exam:   VS:  BP 138/78 (BP Location: Left Arm, Patient Position: Sitting, Cuff Size: Normal)   Pulse 85   Ht 5' 2 (1.575 m)   Wt 94 lb (42.6 kg)   SpO2 96%   BMI 17.19 kg/m   difficult check BP-initial was 140/70, follow-up 136/78 Wt Readings from Last 3 Encounters:  12/20/23 94 lb (42.6 kg)  09/26/23 102 lb 15.3 oz (46.7 kg)  09/04/23 103 lb 2.8 oz (46.8 kg)    GEN: Thin, frail chronically ill woman who appears older than stated age.  She is wheelchair-bound.  Right hemiplegia with dysarthria.  Not very mobile NECK: No JVD; No carotid bruits => bruit heard from fistula throughout the precordium and neck. CARDIAC:  RRR with occasional ectopy.  Normal S1, S2 with likely S4 gallop but difficult to hear because of radiating berry.;  Unable to assess rubs or gallops but there is a clear 2/6 SEM at RUSB. RESPIRATORY:  Clear to auscultation without rales, wheezing or rhonchi ; nonlabored, good air movement. ABDOMEN: Soft, non-tender, non-distended EXTREMITIES:  No edema; No deformity = > right upper extremity fistula with palpable thrill.     ASSESSMENT AND PLAN: .    Problem List Items Addressed This Visit       Cardiology Problems   CAD S/P PCI x 2 - Primary (Chronic)   Intermittent chest pain post-dialysis, requiring nitroglycerin .  Both hyper and hypotension noted during dialysis.  Activity very much limited activity due to stroke  with residual c hemiplegia.   Discussed potential side effects of long-acting nitroglycerin . - Add long-acting nitroglycerin  with hydralazine  for angina and blood pressure management. - Continue Plavix  75 mg daily => okay to hold 5 to 7 days preop for surgeries or procedures.3 - Continue combination of amlodipine   10 mg daily and carvedilol  25 mg twice daily => low threshold to adjust dosing for dialysis as needed in the past.  (However, it would be be nice to have some input from nephrology) - Monitor blood pressure during dialysis and record readings. - Consider stress test if angina frequency increases. - Check lipid panel during primary care visit.      Relevant Medications   hydrALAZINE  (APRESOLINE ) 25 MG tablet   isosorbide  mononitrate (IMDUR ) 30 MG 24 hr tablet   Essential hypertension (Chronic)   Suboptimal control with amlodipine  10 mg daily and carvedilol  25 mg mg twice daily.  Limited by low blood pressure during dialysis.  Questionable benefit from ARB/ARNI/ACE I.  Also questional benefit from spironolactone or other diuretic  - Add hydralazine  25 mg BID along with Imdur  30 mg daily (I chose twice daily dosing for ease of use and adherence since she is already taking the carvedilol  twice a day) ->  she is not to take prior to dialysis. - Monitor blood pressure during dialysis and record readings. - Discuss blood pressure management with nephrologist during dialysis visits.      Relevant Medications   hydrALAZINE  (APRESOLINE ) 25 MG tablet   isosorbide  mononitrate (IMDUR ) 30 MG 24 hr tablet   Hyperlipidemia associated with type 2 diabetes mellitus (HCC) (Chronic)   I have not seen labs checked since November 2023 and the LDL was 41 on current dose of rosuvastatin  20 mg daily. - She is due to see her PCP on August 22.  I would asked that they please check lipids and LFTs. - Continue current dose of rosuvastatin .   Diabetes being managed by PCP when insulin .      Relevant Medications   hydrALAZINE  (APRESOLINE ) 25 MG tablet   isosorbide  mononitrate (IMDUR ) 30 MG 24 hr tablet     Other   History of myocardial infarction due to atherothrombotic coronary artery disease (Chronic)   History of MI back in 2009.  Echocardiogram did not show wall motion normalities.  No evidence of anterior  or heart failure.  Myoview  in 2017 was nonischemic.  Treatment strategy is BP and lipid control from a cardiology standpoint.  Diabetes control 5 PCP in dialysis/functional by nephrology.              Follow-Up: Return in about 6 months (around 06/21/2024) for Alternate 6 month follow-up with APP & MD.  Total time spent: 27 min spent with  patient + 30 min spent reviewing chart data and charting = 57 min I spent 57 minutes in the care of Hannah [name] today including reviewing labs (1 minute), reviewing outside labs from [facility] and searching through Care Everywhere (2 minutes), reviewing studies (prior echocardiogram results and Myoview  reviewed-4 minutes), face to face time discussing treatment options (27), reviewing records from clinic visit from 8 over 18 months ago, 2 hospitalizations and other follow-up notes (11 minutes), 12 minutes dictating, and documenting in the encounter.     Signed, Alm MICAEL Clay, MD, MS Alm Clay, M.D., M.S. Interventional Chartered certified accountant  Pager # [phone]

## 2023-12-20 NOTE — Patient Instructions (Signed)
 Medication Instructions:  Start taking Isosorbide  Mono ( Imdur ) 30 mg at bedtime with Hydralazine   25 mg    Start taking Hydralazine   25 mg twice a day   first dose with Carvedilol     *If you need a refill on your cardiac medications before your next appointment, please call your pharmacy*   Lab Work:  Please ask  Dr Delayne  to check you cholesterol level ( lipid) at your next appt on 01/10/24   If you have labs (blood work) drawn today and your tests are completely normal, you will receive your results only by: MyChart Message (if you have MyChart) OR A paper copy in the mail If you have any lab test that is abnormal or we need to change your treatment, we will call you to review the results.   Testing/Procedures:  Not needed  Follow-Up: At [facility], you and your health needs are our priority.  As part of our continuing mission to provide you with exceptional heart care, we have created designated Provider Care Teams.  These Care Teams include your primary Cardiologist (physician) and Advanced Practice Providers (APPs -  Physician Assistants and Nurse Practitioners) who all work together to provide you with the care you need, when you need it.     Your next appointment:   6 month(s)  The format for your next appointment:   In Person  Provider:   Damien Braver, NP or Katlyn West, NP      Then, Alm Clay, MD will plan to see you again in 12 month(s).

## 2023-12-21 DIAGNOSIS — D509 Iron deficiency anemia, unspecified: Secondary | ICD-10-CM | POA: Diagnosis not present

## 2023-12-21 DIAGNOSIS — D689 Coagulation defect, unspecified: Secondary | ICD-10-CM | POA: Diagnosis not present

## 2023-12-21 DIAGNOSIS — E1122 Type 2 diabetes mellitus with diabetic chronic kidney disease: Secondary | ICD-10-CM | POA: Diagnosis not present

## 2023-12-21 DIAGNOSIS — Z992 Dependence on renal dialysis: Secondary | ICD-10-CM | POA: Diagnosis not present

## 2023-12-21 DIAGNOSIS — N2581 Secondary hyperparathyroidism of renal origin: Secondary | ICD-10-CM | POA: Diagnosis not present

## 2023-12-21 DIAGNOSIS — N186 End stage renal disease: Secondary | ICD-10-CM | POA: Diagnosis not present

## 2023-12-21 DIAGNOSIS — D631 Anemia in chronic kidney disease: Secondary | ICD-10-CM | POA: Diagnosis not present

## 2023-12-23 DIAGNOSIS — K5901 Slow transit constipation: Secondary | ICD-10-CM | POA: Diagnosis not present

## 2023-12-23 DIAGNOSIS — I69351 Hemiplegia and hemiparesis following cerebral infarction affecting right dominant side: Secondary | ICD-10-CM | POA: Diagnosis not present

## 2023-12-23 DIAGNOSIS — N2581 Secondary hyperparathyroidism of renal origin: Secondary | ICD-10-CM | POA: Diagnosis not present

## 2023-12-23 DIAGNOSIS — E1122 Type 2 diabetes mellitus with diabetic chronic kidney disease: Secondary | ICD-10-CM | POA: Diagnosis not present

## 2023-12-23 DIAGNOSIS — M533 Sacrococcygeal disorders, not elsewhere classified: Secondary | ICD-10-CM | POA: Diagnosis not present

## 2023-12-23 DIAGNOSIS — N186 End stage renal disease: Secondary | ICD-10-CM | POA: Diagnosis not present

## 2023-12-23 DIAGNOSIS — Z992 Dependence on renal dialysis: Secondary | ICD-10-CM | POA: Diagnosis not present

## 2023-12-23 DIAGNOSIS — D509 Iron deficiency anemia, unspecified: Secondary | ICD-10-CM | POA: Diagnosis not present

## 2023-12-23 DIAGNOSIS — D631 Anemia in chronic kidney disease: Secondary | ICD-10-CM | POA: Diagnosis not present

## 2023-12-23 DIAGNOSIS — D689 Coagulation defect, unspecified: Secondary | ICD-10-CM | POA: Diagnosis not present

## 2023-12-26 DIAGNOSIS — E1122 Type 2 diabetes mellitus with diabetic chronic kidney disease: Secondary | ICD-10-CM | POA: Diagnosis not present

## 2023-12-26 DIAGNOSIS — N2581 Secondary hyperparathyroidism of renal origin: Secondary | ICD-10-CM | POA: Diagnosis not present

## 2023-12-26 DIAGNOSIS — N186 End stage renal disease: Secondary | ICD-10-CM | POA: Diagnosis not present

## 2023-12-26 DIAGNOSIS — Z992 Dependence on renal dialysis: Secondary | ICD-10-CM | POA: Diagnosis not present

## 2023-12-26 DIAGNOSIS — D509 Iron deficiency anemia, unspecified: Secondary | ICD-10-CM | POA: Diagnosis not present

## 2023-12-26 DIAGNOSIS — D631 Anemia in chronic kidney disease: Secondary | ICD-10-CM | POA: Diagnosis not present

## 2023-12-26 DIAGNOSIS — D689 Coagulation defect, unspecified: Secondary | ICD-10-CM | POA: Diagnosis not present

## 2023-12-28 DIAGNOSIS — D509 Iron deficiency anemia, unspecified: Secondary | ICD-10-CM | POA: Diagnosis not present

## 2023-12-28 DIAGNOSIS — N186 End stage renal disease: Secondary | ICD-10-CM | POA: Diagnosis not present

## 2023-12-28 DIAGNOSIS — Z992 Dependence on renal dialysis: Secondary | ICD-10-CM | POA: Diagnosis not present

## 2023-12-28 DIAGNOSIS — D689 Coagulation defect, unspecified: Secondary | ICD-10-CM | POA: Diagnosis not present

## 2023-12-28 DIAGNOSIS — E1122 Type 2 diabetes mellitus with diabetic chronic kidney disease: Secondary | ICD-10-CM | POA: Diagnosis not present

## 2023-12-28 DIAGNOSIS — N2581 Secondary hyperparathyroidism of renal origin: Secondary | ICD-10-CM | POA: Diagnosis not present

## 2023-12-28 DIAGNOSIS — D631 Anemia in chronic kidney disease: Secondary | ICD-10-CM | POA: Diagnosis not present

## 2023-12-29 ENCOUNTER — Encounter: Payer: Self-pay | Admitting: Cardiology

## 2023-12-29 NOTE — Assessment & Plan Note (Signed)
 History of MI back in 2009.  Echocardiogram did not show wall motion normalities.  No evidence of anterior or heart failure.  Myoview  in 2017 was nonischemic.  Treatment strategy is BP and lipid control from a cardiology standpoint.  Diabetes control 5 PCP in dialysis/functional by nephrology.

## 2023-12-29 NOTE — Assessment & Plan Note (Addendum)
 I have not seen labs checked since November 2023 and the LDL was 41 on current dose of rosuvastatin  20 mg daily. - She is due to see her PCP on August 22.  I would asked that they please check lipids and LFTs. - Continue current dose of rosuvastatin .   Diabetes being managed by PCP when insulin .

## 2023-12-29 NOTE — Assessment & Plan Note (Addendum)
 Suboptimal control with amlodipine  10 mg daily and carvedilol  25 mg mg twice daily.  Limited by low blood pressure during dialysis.  Questionable benefit from ARB/ARNI/ACE I.  Also questional benefit from spironolactone or other diuretic  - Add hydralazine  25 mg BID along with Imdur  30 mg daily (I chose twice daily dosing for ease of use and adherence since she is already taking the carvedilol  twice a day) ->  she is not to take prior to dialysis. - Monitor blood pressure during dialysis and record readings. - Discuss blood pressure management with nephrologist during dialysis visits.

## 2023-12-29 NOTE — Assessment & Plan Note (Signed)
 Intermittent chest pain post-dialysis, requiring nitroglycerin .  Both hyper and hypotension noted during dialysis.  Activity very much limited activity due to stroke  with residual c hemiplegia.   Discussed potential side effects of long-acting nitroglycerin . - Add long-acting nitroglycerin  with hydralazine  for angina and blood pressure management. - Continue Plavix  75 mg daily => okay to hold 5 to 7 days preop for surgeries or procedures.3 - Continue combination of amlodipine  10 mg daily and carvedilol  25 mg twice daily => low threshold to adjust dosing for dialysis as needed in the past.  (However, it would be be nice to have some input from nephrology) - Monitor blood pressure during dialysis and record readings. - Consider stress test if angina frequency increases. - Check lipid panel during primary care visit.

## 2024-01-02 DIAGNOSIS — E1122 Type 2 diabetes mellitus with diabetic chronic kidney disease: Secondary | ICD-10-CM | POA: Diagnosis not present

## 2024-01-02 DIAGNOSIS — Z992 Dependence on renal dialysis: Secondary | ICD-10-CM | POA: Diagnosis not present

## 2024-01-02 DIAGNOSIS — N186 End stage renal disease: Secondary | ICD-10-CM | POA: Diagnosis not present

## 2024-01-02 DIAGNOSIS — D631 Anemia in chronic kidney disease: Secondary | ICD-10-CM | POA: Diagnosis not present

## 2024-01-02 DIAGNOSIS — D689 Coagulation defect, unspecified: Secondary | ICD-10-CM | POA: Diagnosis not present

## 2024-01-02 DIAGNOSIS — D509 Iron deficiency anemia, unspecified: Secondary | ICD-10-CM | POA: Diagnosis not present

## 2024-01-02 DIAGNOSIS — N2581 Secondary hyperparathyroidism of renal origin: Secondary | ICD-10-CM | POA: Diagnosis not present

## 2024-01-04 DIAGNOSIS — N186 End stage renal disease: Secondary | ICD-10-CM | POA: Diagnosis not present

## 2024-01-04 DIAGNOSIS — E1122 Type 2 diabetes mellitus with diabetic chronic kidney disease: Secondary | ICD-10-CM | POA: Diagnosis not present

## 2024-01-04 DIAGNOSIS — N2581 Secondary hyperparathyroidism of renal origin: Secondary | ICD-10-CM | POA: Diagnosis not present

## 2024-01-04 DIAGNOSIS — Z992 Dependence on renal dialysis: Secondary | ICD-10-CM | POA: Diagnosis not present

## 2024-01-04 DIAGNOSIS — D509 Iron deficiency anemia, unspecified: Secondary | ICD-10-CM | POA: Diagnosis not present

## 2024-01-04 DIAGNOSIS — D689 Coagulation defect, unspecified: Secondary | ICD-10-CM | POA: Diagnosis not present

## 2024-01-04 DIAGNOSIS — D631 Anemia in chronic kidney disease: Secondary | ICD-10-CM | POA: Diagnosis not present

## 2024-01-06 DIAGNOSIS — N186 End stage renal disease: Secondary | ICD-10-CM | POA: Diagnosis not present

## 2024-01-06 DIAGNOSIS — Z992 Dependence on renal dialysis: Secondary | ICD-10-CM | POA: Diagnosis not present

## 2024-01-06 DIAGNOSIS — N2581 Secondary hyperparathyroidism of renal origin: Secondary | ICD-10-CM | POA: Diagnosis not present

## 2024-01-06 DIAGNOSIS — D509 Iron deficiency anemia, unspecified: Secondary | ICD-10-CM | POA: Diagnosis not present

## 2024-01-06 DIAGNOSIS — D689 Coagulation defect, unspecified: Secondary | ICD-10-CM | POA: Diagnosis not present

## 2024-01-06 DIAGNOSIS — D631 Anemia in chronic kidney disease: Secondary | ICD-10-CM | POA: Diagnosis not present

## 2024-01-06 DIAGNOSIS — E1122 Type 2 diabetes mellitus with diabetic chronic kidney disease: Secondary | ICD-10-CM | POA: Diagnosis not present

## 2024-01-09 DIAGNOSIS — Z992 Dependence on renal dialysis: Secondary | ICD-10-CM | POA: Diagnosis not present

## 2024-01-09 DIAGNOSIS — D689 Coagulation defect, unspecified: Secondary | ICD-10-CM | POA: Diagnosis not present

## 2024-01-09 DIAGNOSIS — D631 Anemia in chronic kidney disease: Secondary | ICD-10-CM | POA: Diagnosis not present

## 2024-01-09 DIAGNOSIS — D509 Iron deficiency anemia, unspecified: Secondary | ICD-10-CM | POA: Diagnosis not present

## 2024-01-09 DIAGNOSIS — N186 End stage renal disease: Secondary | ICD-10-CM | POA: Diagnosis not present

## 2024-01-09 DIAGNOSIS — N2581 Secondary hyperparathyroidism of renal origin: Secondary | ICD-10-CM | POA: Diagnosis not present

## 2024-01-09 DIAGNOSIS — E1122 Type 2 diabetes mellitus with diabetic chronic kidney disease: Secondary | ICD-10-CM | POA: Diagnosis not present

## 2024-01-11 DIAGNOSIS — Z992 Dependence on renal dialysis: Secondary | ICD-10-CM | POA: Diagnosis not present

## 2024-01-11 DIAGNOSIS — N186 End stage renal disease: Secondary | ICD-10-CM | POA: Diagnosis not present

## 2024-01-11 DIAGNOSIS — E1122 Type 2 diabetes mellitus with diabetic chronic kidney disease: Secondary | ICD-10-CM | POA: Diagnosis not present

## 2024-01-11 DIAGNOSIS — D631 Anemia in chronic kidney disease: Secondary | ICD-10-CM | POA: Diagnosis not present

## 2024-01-11 DIAGNOSIS — D509 Iron deficiency anemia, unspecified: Secondary | ICD-10-CM | POA: Diagnosis not present

## 2024-01-11 DIAGNOSIS — D689 Coagulation defect, unspecified: Secondary | ICD-10-CM | POA: Diagnosis not present

## 2024-01-11 DIAGNOSIS — N2581 Secondary hyperparathyroidism of renal origin: Secondary | ICD-10-CM | POA: Diagnosis not present

## 2024-01-13 DIAGNOSIS — Z992 Dependence on renal dialysis: Secondary | ICD-10-CM | POA: Diagnosis not present

## 2024-01-13 DIAGNOSIS — D689 Coagulation defect, unspecified: Secondary | ICD-10-CM | POA: Diagnosis not present

## 2024-01-13 DIAGNOSIS — D631 Anemia in chronic kidney disease: Secondary | ICD-10-CM | POA: Diagnosis not present

## 2024-01-13 DIAGNOSIS — N2581 Secondary hyperparathyroidism of renal origin: Secondary | ICD-10-CM | POA: Diagnosis not present

## 2024-01-13 DIAGNOSIS — N186 End stage renal disease: Secondary | ICD-10-CM | POA: Diagnosis not present

## 2024-01-13 DIAGNOSIS — E1122 Type 2 diabetes mellitus with diabetic chronic kidney disease: Secondary | ICD-10-CM | POA: Diagnosis not present

## 2024-01-13 DIAGNOSIS — D509 Iron deficiency anemia, unspecified: Secondary | ICD-10-CM | POA: Diagnosis not present

## 2024-01-15 DIAGNOSIS — E1022 Type 1 diabetes mellitus with diabetic chronic kidney disease: Secondary | ICD-10-CM | POA: Diagnosis not present

## 2024-01-15 DIAGNOSIS — M818 Other osteoporosis without current pathological fracture: Secondary | ICD-10-CM | POA: Diagnosis not present

## 2024-01-15 DIAGNOSIS — N186 End stage renal disease: Secondary | ICD-10-CM | POA: Diagnosis not present

## 2024-01-15 DIAGNOSIS — E1165 Type 2 diabetes mellitus with hyperglycemia: Secondary | ICD-10-CM | POA: Diagnosis not present

## 2024-01-15 DIAGNOSIS — Z992 Dependence on renal dialysis: Secondary | ICD-10-CM | POA: Diagnosis not present

## 2024-01-15 DIAGNOSIS — I10 Essential (primary) hypertension: Secondary | ICD-10-CM | POA: Diagnosis not present

## 2024-01-16 DIAGNOSIS — N2581 Secondary hyperparathyroidism of renal origin: Secondary | ICD-10-CM | POA: Diagnosis not present

## 2024-01-16 DIAGNOSIS — D689 Coagulation defect, unspecified: Secondary | ICD-10-CM | POA: Diagnosis not present

## 2024-01-16 DIAGNOSIS — D509 Iron deficiency anemia, unspecified: Secondary | ICD-10-CM | POA: Diagnosis not present

## 2024-01-16 DIAGNOSIS — Z992 Dependence on renal dialysis: Secondary | ICD-10-CM | POA: Diagnosis not present

## 2024-01-16 DIAGNOSIS — D631 Anemia in chronic kidney disease: Secondary | ICD-10-CM | POA: Diagnosis not present

## 2024-01-16 DIAGNOSIS — L299 Pruritus, unspecified: Secondary | ICD-10-CM | POA: Diagnosis not present

## 2024-01-16 DIAGNOSIS — E1122 Type 2 diabetes mellitus with diabetic chronic kidney disease: Secondary | ICD-10-CM | POA: Diagnosis not present

## 2024-01-16 DIAGNOSIS — N186 End stage renal disease: Secondary | ICD-10-CM | POA: Diagnosis not present

## 2024-01-18 DIAGNOSIS — E1122 Type 2 diabetes mellitus with diabetic chronic kidney disease: Secondary | ICD-10-CM | POA: Diagnosis not present

## 2024-01-18 DIAGNOSIS — D631 Anemia in chronic kidney disease: Secondary | ICD-10-CM | POA: Diagnosis not present

## 2024-01-18 DIAGNOSIS — L299 Pruritus, unspecified: Secondary | ICD-10-CM | POA: Diagnosis not present

## 2024-01-18 DIAGNOSIS — N2581 Secondary hyperparathyroidism of renal origin: Secondary | ICD-10-CM | POA: Diagnosis not present

## 2024-01-18 DIAGNOSIS — D689 Coagulation defect, unspecified: Secondary | ICD-10-CM | POA: Diagnosis not present

## 2024-01-18 DIAGNOSIS — Z992 Dependence on renal dialysis: Secondary | ICD-10-CM | POA: Diagnosis not present

## 2024-01-18 DIAGNOSIS — N186 End stage renal disease: Secondary | ICD-10-CM | POA: Diagnosis not present

## 2024-01-18 DIAGNOSIS — D509 Iron deficiency anemia, unspecified: Secondary | ICD-10-CM | POA: Diagnosis not present

## 2024-01-20 DIAGNOSIS — E1122 Type 2 diabetes mellitus with diabetic chronic kidney disease: Secondary | ICD-10-CM | POA: Diagnosis not present

## 2024-01-20 DIAGNOSIS — Z992 Dependence on renal dialysis: Secondary | ICD-10-CM | POA: Diagnosis not present

## 2024-01-20 DIAGNOSIS — N2581 Secondary hyperparathyroidism of renal origin: Secondary | ICD-10-CM | POA: Diagnosis not present

## 2024-01-20 DIAGNOSIS — N186 End stage renal disease: Secondary | ICD-10-CM | POA: Diagnosis not present

## 2024-01-20 DIAGNOSIS — L299 Pruritus, unspecified: Secondary | ICD-10-CM | POA: Diagnosis not present

## 2024-01-20 DIAGNOSIS — D689 Coagulation defect, unspecified: Secondary | ICD-10-CM | POA: Diagnosis not present

## 2024-01-20 DIAGNOSIS — D509 Iron deficiency anemia, unspecified: Secondary | ICD-10-CM | POA: Diagnosis not present

## 2024-01-20 DIAGNOSIS — D631 Anemia in chronic kidney disease: Secondary | ICD-10-CM | POA: Diagnosis not present

## 2024-01-23 DIAGNOSIS — L299 Pruritus, unspecified: Secondary | ICD-10-CM | POA: Diagnosis not present

## 2024-01-23 DIAGNOSIS — E1122 Type 2 diabetes mellitus with diabetic chronic kidney disease: Secondary | ICD-10-CM | POA: Diagnosis not present

## 2024-01-23 DIAGNOSIS — D631 Anemia in chronic kidney disease: Secondary | ICD-10-CM | POA: Diagnosis not present

## 2024-01-23 DIAGNOSIS — N2581 Secondary hyperparathyroidism of renal origin: Secondary | ICD-10-CM | POA: Diagnosis not present

## 2024-01-23 DIAGNOSIS — D509 Iron deficiency anemia, unspecified: Secondary | ICD-10-CM | POA: Diagnosis not present

## 2024-01-23 DIAGNOSIS — D689 Coagulation defect, unspecified: Secondary | ICD-10-CM | POA: Diagnosis not present

## 2024-01-23 DIAGNOSIS — N186 End stage renal disease: Secondary | ICD-10-CM | POA: Diagnosis not present

## 2024-01-23 DIAGNOSIS — Z992 Dependence on renal dialysis: Secondary | ICD-10-CM | POA: Diagnosis not present

## 2024-01-25 DIAGNOSIS — D631 Anemia in chronic kidney disease: Secondary | ICD-10-CM | POA: Diagnosis not present

## 2024-01-25 DIAGNOSIS — D509 Iron deficiency anemia, unspecified: Secondary | ICD-10-CM | POA: Diagnosis not present

## 2024-01-25 DIAGNOSIS — Z992 Dependence on renal dialysis: Secondary | ICD-10-CM | POA: Diagnosis not present

## 2024-01-25 DIAGNOSIS — N186 End stage renal disease: Secondary | ICD-10-CM | POA: Diagnosis not present

## 2024-01-25 DIAGNOSIS — N2581 Secondary hyperparathyroidism of renal origin: Secondary | ICD-10-CM | POA: Diagnosis not present

## 2024-01-25 DIAGNOSIS — L299 Pruritus, unspecified: Secondary | ICD-10-CM | POA: Diagnosis not present

## 2024-01-25 DIAGNOSIS — D689 Coagulation defect, unspecified: Secondary | ICD-10-CM | POA: Diagnosis not present

## 2024-01-25 DIAGNOSIS — E1122 Type 2 diabetes mellitus with diabetic chronic kidney disease: Secondary | ICD-10-CM | POA: Diagnosis not present

## 2024-01-27 DIAGNOSIS — N2581 Secondary hyperparathyroidism of renal origin: Secondary | ICD-10-CM | POA: Diagnosis not present

## 2024-01-27 DIAGNOSIS — D509 Iron deficiency anemia, unspecified: Secondary | ICD-10-CM | POA: Diagnosis not present

## 2024-01-27 DIAGNOSIS — D631 Anemia in chronic kidney disease: Secondary | ICD-10-CM | POA: Diagnosis not present

## 2024-01-27 DIAGNOSIS — D689 Coagulation defect, unspecified: Secondary | ICD-10-CM | POA: Diagnosis not present

## 2024-01-27 DIAGNOSIS — Z992 Dependence on renal dialysis: Secondary | ICD-10-CM | POA: Diagnosis not present

## 2024-01-27 DIAGNOSIS — E1122 Type 2 diabetes mellitus with diabetic chronic kidney disease: Secondary | ICD-10-CM | POA: Diagnosis not present

## 2024-01-27 DIAGNOSIS — N186 End stage renal disease: Secondary | ICD-10-CM | POA: Diagnosis not present

## 2024-01-27 DIAGNOSIS — L299 Pruritus, unspecified: Secondary | ICD-10-CM | POA: Diagnosis not present

## 2024-01-30 DIAGNOSIS — Z992 Dependence on renal dialysis: Secondary | ICD-10-CM | POA: Diagnosis not present

## 2024-01-30 DIAGNOSIS — L299 Pruritus, unspecified: Secondary | ICD-10-CM | POA: Diagnosis not present

## 2024-01-30 DIAGNOSIS — D631 Anemia in chronic kidney disease: Secondary | ICD-10-CM | POA: Diagnosis not present

## 2024-01-30 DIAGNOSIS — N2581 Secondary hyperparathyroidism of renal origin: Secondary | ICD-10-CM | POA: Diagnosis not present

## 2024-01-30 DIAGNOSIS — N186 End stage renal disease: Secondary | ICD-10-CM | POA: Diagnosis not present

## 2024-01-30 DIAGNOSIS — E1122 Type 2 diabetes mellitus with diabetic chronic kidney disease: Secondary | ICD-10-CM | POA: Diagnosis not present

## 2024-01-30 DIAGNOSIS — D509 Iron deficiency anemia, unspecified: Secondary | ICD-10-CM | POA: Diagnosis not present

## 2024-01-30 DIAGNOSIS — D689 Coagulation defect, unspecified: Secondary | ICD-10-CM | POA: Diagnosis not present

## 2024-02-01 DIAGNOSIS — N186 End stage renal disease: Secondary | ICD-10-CM | POA: Diagnosis not present

## 2024-02-01 DIAGNOSIS — D689 Coagulation defect, unspecified: Secondary | ICD-10-CM | POA: Diagnosis not present

## 2024-02-01 DIAGNOSIS — D509 Iron deficiency anemia, unspecified: Secondary | ICD-10-CM | POA: Diagnosis not present

## 2024-02-01 DIAGNOSIS — E1122 Type 2 diabetes mellitus with diabetic chronic kidney disease: Secondary | ICD-10-CM | POA: Diagnosis not present

## 2024-02-01 DIAGNOSIS — Z992 Dependence on renal dialysis: Secondary | ICD-10-CM | POA: Diagnosis not present

## 2024-02-01 DIAGNOSIS — D631 Anemia in chronic kidney disease: Secondary | ICD-10-CM | POA: Diagnosis not present

## 2024-02-01 DIAGNOSIS — L299 Pruritus, unspecified: Secondary | ICD-10-CM | POA: Diagnosis not present

## 2024-02-01 DIAGNOSIS — N2581 Secondary hyperparathyroidism of renal origin: Secondary | ICD-10-CM | POA: Diagnosis not present

## 2024-02-03 DIAGNOSIS — Z992 Dependence on renal dialysis: Secondary | ICD-10-CM | POA: Diagnosis not present

## 2024-02-03 DIAGNOSIS — N186 End stage renal disease: Secondary | ICD-10-CM | POA: Diagnosis not present

## 2024-02-03 DIAGNOSIS — D631 Anemia in chronic kidney disease: Secondary | ICD-10-CM | POA: Diagnosis not present

## 2024-02-03 DIAGNOSIS — D689 Coagulation defect, unspecified: Secondary | ICD-10-CM | POA: Diagnosis not present

## 2024-02-03 DIAGNOSIS — E1122 Type 2 diabetes mellitus with diabetic chronic kidney disease: Secondary | ICD-10-CM | POA: Diagnosis not present

## 2024-02-03 DIAGNOSIS — D509 Iron deficiency anemia, unspecified: Secondary | ICD-10-CM | POA: Diagnosis not present

## 2024-02-03 DIAGNOSIS — L299 Pruritus, unspecified: Secondary | ICD-10-CM | POA: Diagnosis not present

## 2024-02-03 DIAGNOSIS — N2581 Secondary hyperparathyroidism of renal origin: Secondary | ICD-10-CM | POA: Diagnosis not present

## 2024-02-06 DIAGNOSIS — N2581 Secondary hyperparathyroidism of renal origin: Secondary | ICD-10-CM | POA: Diagnosis not present

## 2024-02-06 DIAGNOSIS — D689 Coagulation defect, unspecified: Secondary | ICD-10-CM | POA: Diagnosis not present

## 2024-02-06 DIAGNOSIS — E1122 Type 2 diabetes mellitus with diabetic chronic kidney disease: Secondary | ICD-10-CM | POA: Diagnosis not present

## 2024-02-06 DIAGNOSIS — N186 End stage renal disease: Secondary | ICD-10-CM | POA: Diagnosis not present

## 2024-02-06 DIAGNOSIS — L299 Pruritus, unspecified: Secondary | ICD-10-CM | POA: Diagnosis not present

## 2024-02-06 DIAGNOSIS — Z992 Dependence on renal dialysis: Secondary | ICD-10-CM | POA: Diagnosis not present

## 2024-02-06 DIAGNOSIS — D631 Anemia in chronic kidney disease: Secondary | ICD-10-CM | POA: Diagnosis not present

## 2024-02-06 DIAGNOSIS — D509 Iron deficiency anemia, unspecified: Secondary | ICD-10-CM | POA: Diagnosis not present

## 2024-02-08 DIAGNOSIS — L299 Pruritus, unspecified: Secondary | ICD-10-CM | POA: Diagnosis not present

## 2024-02-08 DIAGNOSIS — D509 Iron deficiency anemia, unspecified: Secondary | ICD-10-CM | POA: Diagnosis not present

## 2024-02-08 DIAGNOSIS — N186 End stage renal disease: Secondary | ICD-10-CM | POA: Diagnosis not present

## 2024-02-08 DIAGNOSIS — D631 Anemia in chronic kidney disease: Secondary | ICD-10-CM | POA: Diagnosis not present

## 2024-02-08 DIAGNOSIS — E1122 Type 2 diabetes mellitus with diabetic chronic kidney disease: Secondary | ICD-10-CM | POA: Diagnosis not present

## 2024-02-08 DIAGNOSIS — N2581 Secondary hyperparathyroidism of renal origin: Secondary | ICD-10-CM | POA: Diagnosis not present

## 2024-02-08 DIAGNOSIS — Z992 Dependence on renal dialysis: Secondary | ICD-10-CM | POA: Diagnosis not present

## 2024-02-08 DIAGNOSIS — D689 Coagulation defect, unspecified: Secondary | ICD-10-CM | POA: Diagnosis not present

## 2024-02-10 DIAGNOSIS — L299 Pruritus, unspecified: Secondary | ICD-10-CM | POA: Diagnosis not present

## 2024-02-10 DIAGNOSIS — D509 Iron deficiency anemia, unspecified: Secondary | ICD-10-CM | POA: Diagnosis not present

## 2024-02-10 DIAGNOSIS — N186 End stage renal disease: Secondary | ICD-10-CM | POA: Diagnosis not present

## 2024-02-10 DIAGNOSIS — D689 Coagulation defect, unspecified: Secondary | ICD-10-CM | POA: Diagnosis not present

## 2024-02-10 DIAGNOSIS — Z992 Dependence on renal dialysis: Secondary | ICD-10-CM | POA: Diagnosis not present

## 2024-02-10 DIAGNOSIS — E1122 Type 2 diabetes mellitus with diabetic chronic kidney disease: Secondary | ICD-10-CM | POA: Diagnosis not present

## 2024-02-10 DIAGNOSIS — N2581 Secondary hyperparathyroidism of renal origin: Secondary | ICD-10-CM | POA: Diagnosis not present

## 2024-02-10 DIAGNOSIS — D631 Anemia in chronic kidney disease: Secondary | ICD-10-CM | POA: Diagnosis not present

## 2024-02-13 DIAGNOSIS — L299 Pruritus, unspecified: Secondary | ICD-10-CM | POA: Diagnosis not present

## 2024-02-13 DIAGNOSIS — E1122 Type 2 diabetes mellitus with diabetic chronic kidney disease: Secondary | ICD-10-CM | POA: Diagnosis not present

## 2024-02-13 DIAGNOSIS — D509 Iron deficiency anemia, unspecified: Secondary | ICD-10-CM | POA: Diagnosis not present

## 2024-02-13 DIAGNOSIS — N186 End stage renal disease: Secondary | ICD-10-CM | POA: Diagnosis not present

## 2024-02-13 DIAGNOSIS — N2581 Secondary hyperparathyroidism of renal origin: Secondary | ICD-10-CM | POA: Diagnosis not present

## 2024-02-13 DIAGNOSIS — Z992 Dependence on renal dialysis: Secondary | ICD-10-CM | POA: Diagnosis not present

## 2024-02-13 DIAGNOSIS — D631 Anemia in chronic kidney disease: Secondary | ICD-10-CM | POA: Diagnosis not present

## 2024-02-13 DIAGNOSIS — D689 Coagulation defect, unspecified: Secondary | ICD-10-CM | POA: Diagnosis not present

## 2024-02-14 DIAGNOSIS — Z992 Dependence on renal dialysis: Secondary | ICD-10-CM | POA: Diagnosis not present

## 2024-02-14 DIAGNOSIS — E1022 Type 1 diabetes mellitus with diabetic chronic kidney disease: Secondary | ICD-10-CM | POA: Diagnosis not present

## 2024-02-14 DIAGNOSIS — M818 Other osteoporosis without current pathological fracture: Secondary | ICD-10-CM | POA: Diagnosis not present

## 2024-02-14 DIAGNOSIS — E1165 Type 2 diabetes mellitus with hyperglycemia: Secondary | ICD-10-CM | POA: Diagnosis not present

## 2024-02-14 DIAGNOSIS — I10 Essential (primary) hypertension: Secondary | ICD-10-CM | POA: Diagnosis not present

## 2024-02-14 DIAGNOSIS — N186 End stage renal disease: Secondary | ICD-10-CM | POA: Diagnosis not present

## 2024-02-14 NOTE — Progress Notes (Signed)
 [facility]  Return Patient Visit    Referring Provider No ref. provider found   Assessment   Hannah [name] is a 58 y.o. female with PMH significant for  has a past medical history of Diabetes mellitus (*), Dialysis patient, ESRD (end stage renal disease) on dialysis (*), Heart attack (*), Hypertension, S/P CABG (coronary artery bypass graft), and Stroke (*). referred from No ref. provider found presenting for new patient consultation.   Patient a history significant for a fracture tailbone received 30 years during childbirth. The pain is located in the tailbone area. She reports pain in bilateral leg with sitting extended periods. She has had increased pain after a recent hospital stay in 2023. Patient describes pain as a severe sharp pain. She reports heightened pain while sitting during dialysis.  The pain is worse with sitting, lying supine.  Patient reports pain improved with rest and medication. She has had an injection over twenty years goes with great benefit. Patient is interested in sedation with procedure. She report heightened pain sensitivity after stroke.  Interval Update: 02/14/2024 Patient returns to clinic after b/l sacrococcygeal nerve block on 10/27/23. She unfortunately reports no improvement after this injection. However, she has no increased pain with palpation of the coccyx today on exam. She is a poor historian and has difficulty describing her pain or the location of her pain to me today. We were able to isolate some of her pain to the SI joints (R>L) during her exam. We discussed this injection in detail and she would like to think about this injection before scheduling. Due to her CKD and frailty, medication options are limited for pain control. She has had tramadol , hydrocodone , and oxycodone  in the past. Of these, she has had the best benefit with hydrocodone . She mentions itching with this medication but has tolerated it well with benadryl  in  the past. Will obtain UDS/OTA today and can consider norco 5-325mg  BID PRN pending results.   1. Sacroiliitis         Plan   1.  Urine drug screen was ordered and results pending.  2. [facility] Reporting is reviewed today and consistent with current reported medication and prior physicians.      Narcotic Violations: none Current Medication Regimen -stable Side effects -minimal   Activity Level- adequate [employer] Behavior- nil Narcotic Contract- reviewed today and signed by the patient  3. Opioid risk tool score:   - 3 low risk         OPIOID RISK TOOL  More data may exist      08/01/2023  Opioid Risk Tool  Family history alcohol  1*  Family history illegal drugs 2*  Family history rx drugs 0  Personal history alcohol 0  Personal history illegal drugs 0  Personal history rx drugs 0  Age between 16-45 years 0  History of preadolescent sexual abuse 0  ADD, OCD, bipolar, schizophrenia 0  Depression 0  Scoring Total 3  Interpretation Low risk for opioid abuse        4. Opioid Medications:  - none prescribed. Consider norco 5-325 BID PRN pending UDS      5. Adjuvant Medications: - none   - Optimize conservative measures including heat/ice therapy to affected site, acetaminophen  and NSAIDs as needed and per label recommendations, and trial OTC lidocaine  patches.  6. Specialized treatments:   - None at this time  7.  Imaging:   - none  8.  Procedures:  - bilateral  SI JI   - Risks and benefits of procedure including but not limited to bleeding, infection, nerve damage, and damage to surrounding structures discussed with patient and all questions were answered to his/her satisfaction.   9. No follow-ups on file.  10. The patient's blood pressure was taken and reported as  (!) 153/66   History of Present Illness: Chief Complaint:  Chief Complaint  Patient presents with  . Follow-up   The most significant area of pain is buttock The pain began 30  years and has been present >3 months. Pain started with: 2023 Patient denies numbness/tingling and denies  associated weakness.   Patient is incontinent of bowel/bladder, not associated with back pain, kisnet related. The pain is described as prickling, aching, throbbing, shooting, and frequent. The patient's pain score is rated as 9/10 on average over the last 7 days. The pain improves and is better with lying down and medications. The pain is made worse with sitting. The patient is unable to do the following activities 2/2 pain:   Denies symptoms including fevers, chills, constipation, nausea, vomiting, lightheadedness, dizziness, or excessive sedation. Denies any side effects to current medication regimen.    Previous Pain Course/Interventions: Has PT been completed for this pain complaint? no  - Location of PT if completed outside of [facility]:  Has chiropractic been completed for this pain complaint? no  Medications utilized:  NSAID's: none Muscle Relaxants: none Anti-Depressants: none Membrane Stabilizers: none Opioids: none Anticoagulant: Plavix   Procedures: Past injection in tailbone 20 years ago  Diagnostic Studies: (I personally reviewed all applicable diagnostic imaging pertinent to this visit):     XR Sacrum/Coccyx FINDINGS:  Sacroiliac erosions more notable on the right. No evidence of  fracture or subluxation. Generalized osteopenia, subjective.  Arterial calcifications in the pelvis and thighs.   IMPRESSION:  Chronic sacroiliac erosions, question underlying renal  osteodystrophy given patient's history.    CT Spine Lumbar 10/06/21  FINDINGS:  Segmentation: 5 lumbar type vertebral bodies.   Alignment: Normal   Vertebrae: No fracture or focal lesion at L4 or above. At L5-S1,  there are some irregularities of the endplates, more affecting the  inferior endplate of L5 than the superior endplate of S1. These are  favored to represent degenerative  change. However, based on the  findings associated with the right ileo psoas musculature, I cannot  completely rule out the possibility of discitis at this level. See  below.   Paraspinal and other soft tissues: Marked enlargement of the right  psoas muscle with areas of increased and decreased density. This  involves the right iliacus muscle to a lesser degree. The appearance  seems more consistent with spontaneous ileo psoas hemorrhage than  infection, but I can not completely rule out that possibility. There  is not appear to be any more generalized retroperitoneal bleeding or  definite inflammatory change at the paravertebral L5-S1 level.   There is aortic atherosclerotic calcification. There is a  low-density area in the upper pole of the right kidney which is not  primarily or completely evaluated but assumed to represent a cyst.  2.7 cm cyst with seen in this region by recent ultrasound.   Disc levels: No significant disc space pathology at T12-L1 or L1-2.   L2-3: Chronic left-sided disc protrusion with annular calcification.  Mild narrowing of the left lateral recess.   L3-4: Unremarkable interspace.   L4-5: Mild bulging of the disc.  No compressive stenosis.   L5-S1: Endplate irregularities and bulging of  the disc as described  above. Findings are favored to represent degenerative change rather  than infectious discitis, though I can not completely exclude the  latter.   IMPRESSION:  Enlargement of the right psoas muscle and to a lesser extent the  upper portion of the right iliacus muscle with areas of low and high  density most consistent with spontaneous intramuscular hematoma. I  can not completely rule out the possibility of infection but do not  favor that.   Some irregularity of the endplates at L5-S1, favored to be  degenerative. Early discitis is not excluded but not favored because  of the reasons enumerated above.   Mild degenerative changes in the  lumbar spine as above, without  advanced finding or definite neural compressive stenosis.   REVIEW OF SYSTEMS:  A complete 13 point review of systems was performed and was noncontributory except as noted in history of present illness, see intake form from today's examination for full details   Health Status  Current medications: Medication List reviewed and updated today. Current Home Medications  Medication Sig Last Dose  acetaminophen  (TYLENOL ) 500 mg tablet Take one tablet (500 mg dose) by mouth every 6 (six) hours as needed.   amLODIPine  besylate (NORVASC ) 10 mg tablet Take one tablet (10 mg dose) by mouth daily.   azaTHIOprine  (IMURAN ) 50 mg tablet Take one tablet (50 mg dose) by mouth daily.   BD PEN NEEDLE NANO 2ND GEN 32G X 4 MM MISC SMARTSIG:injection 4 Times Daily   calcium  acetate (PHOSLO ) 667 MG tablet Take one tablet (667 mg dose) by mouth 3 (three) times daily with meals.   carvedilol  (COREG ) 25 mg tablet Take one tablet (25 mg dose) by mouth 2 (two) times daily.   clopidogrel  bisulfate (PLAVIX ) 75 mg tablet Take one tablet (75 mg dose) by mouth daily.   Continuous Glucose Sensor (FREESTYLE LIBRE 3 PLUS SENSOR) MISC SMARTSIG:1   ethyl chloride spray SMARTSIG:liberally Topical 3 Times a Week   famotidine  (PEPCID ) 20 mg tablet Take one tablet (20 mg dose) by mouth daily.   hydrALAZINE  HCl (APRESOLINE ) 25 mg tablet Take one tablet (25 mg dose) by mouth 2 (two) times daily.   insulin  aspart, NOVOLOG , (NOVOLOG ) 100 Unit/mL injection Inject six Units into the skin.   insulin  lispro, 1 Unit Dial , (HUMALOG  KWIKPEN) 100 Units/mL SOPN injection SMARTSIG:5 Unit(s) SUB-Q 3 Times Daily   isosorbide  mononitrate (IMDUR ) 30 mg 24 hr tablet Take one tablet (30 mg dose) by mouth at bedtime.   lidocaine -prilocaine  (EMLA ) cream SMARTSIG:sparingly Topical   melatonin 5 mg Take one tablet (5 mg dose) by mouth.   ondansetron  (ZOFRAN ) 4 mg tablet Take one tablet (4 mg dose) by mouth every 8 (eight)  hours as needed.   polyethylene glycol (MIRALAX ) 17 GM/SCOOP powder Take 120 mLs (17 g dose) by mouth daily.   REGLAN  5 MG tablet Take one tablet (5 mg dose) by mouth 2 (two) times daily.   rosuvastatin  calcium  (CRESTOR ) 20 mg tablet Take one tablet (20 mg dose) by mouth daily.   senna (SENNA-TIME) 8.6 mg tablet Take one tablet (8.6 mg dose) by mouth daily.   TRESIBA  FLEXTOUCH 100 UNIT/ML injection SMARTSIG:30 Unit(s) SUB-Q Daily       Problem list: Problem list reviewed and updated at today's visit Patient Active Problem List  Diagnosis  . Coccydynia  . Sacroiliitis      OBJECTIVE: Body mass index is 18.66 kg/m.  Physical Exam:  Vitals:   02/14/24 1537  BP: (!) 153/66  Pulse: 80  Temp: 97.8 F (36.6 C)  TempSrc: Temporal  SpO2: 95%  Weight: 102 lb (46.3 kg)  Height: 5' 2 (1.575 m)    GENERAL/PSYCHIATRIC:  Patient is alert, oriented, answers questions appropriately. In no apparent distress. Interpersonal behavior socially appropriate. Mood appropriate to interview. HEENT:  Normocepalic, conjuctiva clear, mucous membranes moist. SKIN:  warm and dry. CARDIOVASCULAR:  Well perfused. No evidence of cyanosis/edema.   RESPIRATORY:  Respirations symetrical and without exertion. ABDOMEN:  soft. EXTREMITIES: Upper and lower extremity motor strength appears to be grossly intact. Grip strength is intact. NEUROLOGICAL:  No gross sensory deficits noted.   GAIT: wheelchair/ Limited exam  Sensory: Fine sensation: Normal in UE and LE, Bilaterally symmetric Temperature sensation: Grossly normal in UE and LE, Bilaterally symmetric  Pain (Neuro/Musculoskeletal): Back: decreased range of motion, without paraspinal tenderness, with significant pain with facet loading b/l (spine extension and rotation),  Sacral:  FABER positive for posterior pain, positive SI joint compression test, positive Gaenslen's test bilaterally   Motor: Muscle Left Right  Grip (C8) 5/5 5/5  Finger  Intrinsics (T1) 5/5 5/5  Wrist EXT (C6) 5/5 5/5  Wrist FLEX  5/5 5/5  Elbow FLEX (C5) 5/5 5/5  Elbow EXT (C7) 5/5 5/5  Shoulder ABD 5/5 5/5     Patient's pain was assessed, documented as positive, unless stated in the HPI, and a follow up plan has been documented in the Plan. Patient's medication list was reviewed and updated if applicable.  Body Mass Index (BMI) screening was documented and if the patient's BMI was greater than 25 or less than 18.5, the patient was asked to follow up with their PCP for a full assessment regarding weight management. If Fluoroscopy was used during a procedure, the radiation exposure time and number of images were documented within the record.   Treatment plan fully discussed and agreed upon with patient. All questions were answered.  Rockey JONELLE Pae, MD  Timed Billing: I have personally spent 30 minutes involved in face-to-face and non-face-to-face activities for this patient on the day of the visit.  Professional time spent includes reviewing the medical record, evaluating the patient, independently interpreting results, coordinating care, and documenting.  I discussed and answered questions regarding the patient's diagnosis along with reviewing imaging findings with the patient, utilizing anatomic model for education regarding etiology of pain and interventional therapies, as well as the risks vs. potential benefits of various treatment options for each specific etiology of pain.  *Some images could not be shown.

## 2024-02-15 DIAGNOSIS — M47896 Other spondylosis, lumbar region: Secondary | ICD-10-CM | POA: Diagnosis not present

## 2024-03-13 ENCOUNTER — Other Ambulatory Visit (HOSPITAL_BASED_OUTPATIENT_CLINIC_OR_DEPARTMENT_OTHER): Payer: Self-pay

## 2024-04-23 ENCOUNTER — Inpatient Hospital Stay (HOSPITAL_COMMUNITY)
Admission: EM | Admit: 2024-04-23 | Discharge: 2024-04-30 | DRG: 064 | Disposition: A | Discharge status: Rehabilitation Facility | Attending: Family Medicine | Admitting: Family Medicine

## 2024-04-23 ENCOUNTER — Emergency Department (HOSPITAL_COMMUNITY)

## 2024-04-23 ENCOUNTER — Encounter (HOSPITAL_COMMUNITY): Payer: Self-pay

## 2024-04-23 ENCOUNTER — Other Ambulatory Visit: Payer: Self-pay

## 2024-04-23 DIAGNOSIS — N186 End stage renal disease: Secondary | ICD-10-CM | POA: Diagnosis present

## 2024-04-23 DIAGNOSIS — M545 Low back pain, unspecified: Secondary | ICD-10-CM | POA: Diagnosis present

## 2024-04-23 DIAGNOSIS — Z992 Dependence on renal dialysis: Secondary | ICD-10-CM | POA: Diagnosis not present

## 2024-04-23 DIAGNOSIS — I639 Cerebral infarction, unspecified: Secondary | ICD-10-CM | POA: Diagnosis present

## 2024-04-23 DIAGNOSIS — I6320 Cerebral infarction due to unspecified occlusion or stenosis of unspecified precerebral arteries: Secondary | ICD-10-CM

## 2024-04-23 DIAGNOSIS — I252 Old myocardial infarction: Secondary | ICD-10-CM | POA: Diagnosis not present

## 2024-04-23 DIAGNOSIS — E785 Hyperlipidemia, unspecified: Secondary | ICD-10-CM | POA: Diagnosis present

## 2024-04-23 DIAGNOSIS — I6329 Cerebral infarction due to unspecified occlusion or stenosis of other precerebral arteries: Secondary | ICD-10-CM | POA: Diagnosis present

## 2024-04-23 DIAGNOSIS — T82838A Hemorrhage of vascular prosthetic devices, implants and grafts, initial encounter: Secondary | ICD-10-CM | POA: Diagnosis not present

## 2024-04-23 DIAGNOSIS — J9601 Acute respiratory failure with hypoxia: Secondary | ICD-10-CM | POA: Diagnosis present

## 2024-04-23 DIAGNOSIS — I120 Hypertensive chronic kidney disease with stage 5 chronic kidney disease or end stage renal disease: Secondary | ICD-10-CM | POA: Diagnosis present

## 2024-04-23 DIAGNOSIS — I674 Hypertensive encephalopathy: Secondary | ICD-10-CM | POA: Diagnosis present

## 2024-04-23 DIAGNOSIS — R29810 Facial weakness: Secondary | ICD-10-CM | POA: Diagnosis present

## 2024-04-23 DIAGNOSIS — D72819 Decreased white blood cell count, unspecified: Secondary | ICD-10-CM | POA: Diagnosis present

## 2024-04-23 DIAGNOSIS — Z794 Long term (current) use of insulin: Secondary | ICD-10-CM | POA: Diagnosis not present

## 2024-04-23 DIAGNOSIS — I6381 Other cerebral infarction due to occlusion or stenosis of small artery: Secondary | ICD-10-CM | POA: Diagnosis not present

## 2024-04-23 DIAGNOSIS — F09 Unspecified mental disorder due to known physiological condition: Secondary | ICD-10-CM | POA: Diagnosis not present

## 2024-04-23 DIAGNOSIS — H3093 Unspecified chorioretinal inflammation, bilateral: Secondary | ICD-10-CM | POA: Diagnosis present

## 2024-04-23 DIAGNOSIS — H5500 Unspecified nystagmus: Secondary | ICD-10-CM | POA: Diagnosis present

## 2024-04-23 DIAGNOSIS — E1151 Type 2 diabetes mellitus with diabetic peripheral angiopathy without gangrene: Secondary | ICD-10-CM | POA: Diagnosis not present

## 2024-04-23 DIAGNOSIS — I6523 Occlusion and stenosis of bilateral carotid arteries: Secondary | ICD-10-CM | POA: Diagnosis not present

## 2024-04-23 DIAGNOSIS — I2510 Atherosclerotic heart disease of native coronary artery without angina pectoris: Secondary | ICD-10-CM | POA: Diagnosis present

## 2024-04-23 DIAGNOSIS — I10 Essential (primary) hypertension: Secondary | ICD-10-CM | POA: Diagnosis not present

## 2024-04-23 DIAGNOSIS — G8191 Hemiplegia, unspecified affecting right dominant side: Secondary | ICD-10-CM | POA: Diagnosis not present

## 2024-04-23 DIAGNOSIS — Z7902 Long term (current) use of antithrombotics/antiplatelets: Secondary | ICD-10-CM | POA: Diagnosis not present

## 2024-04-23 DIAGNOSIS — R29705 NIHSS score 5: Secondary | ICD-10-CM | POA: Diagnosis present

## 2024-04-23 DIAGNOSIS — R471 Dysarthria and anarthria: Secondary | ICD-10-CM | POA: Diagnosis present

## 2024-04-23 DIAGNOSIS — E1122 Type 2 diabetes mellitus with diabetic chronic kidney disease: Secondary | ICD-10-CM | POA: Diagnosis present

## 2024-04-23 DIAGNOSIS — I69354 Hemiplegia and hemiparesis following cerebral infarction affecting left non-dominant side: Secondary | ICD-10-CM | POA: Diagnosis not present

## 2024-04-23 DIAGNOSIS — G35D Multiple sclerosis, unspecified: Secondary | ICD-10-CM | POA: Diagnosis present

## 2024-04-23 DIAGNOSIS — I3139 Other pericardial effusion (noninflammatory): Secondary | ICD-10-CM | POA: Diagnosis not present

## 2024-04-23 DIAGNOSIS — G8929 Other chronic pain: Secondary | ICD-10-CM | POA: Diagnosis present

## 2024-04-23 DIAGNOSIS — I6389 Other cerebral infarction: Secondary | ICD-10-CM | POA: Diagnosis not present

## 2024-04-23 DIAGNOSIS — I6302 Cerebral infarction due to thrombosis of basilar artery: Secondary | ICD-10-CM | POA: Diagnosis not present

## 2024-04-23 DIAGNOSIS — Y841 Kidney dialysis as the cause of abnormal reaction of the patient, or of later complication, without mention of misadventure at the time of the procedure: Secondary | ICD-10-CM | POA: Diagnosis not present

## 2024-04-23 DIAGNOSIS — I69320 Aphasia following cerebral infarction: Secondary | ICD-10-CM | POA: Diagnosis not present

## 2024-04-23 DIAGNOSIS — H539 Unspecified visual disturbance: Principal | ICD-10-CM

## 2024-04-23 DIAGNOSIS — D631 Anemia in chronic kidney disease: Secondary | ICD-10-CM | POA: Diagnosis present

## 2024-04-23 DIAGNOSIS — E11649 Type 2 diabetes mellitus with hypoglycemia without coma: Secondary | ICD-10-CM | POA: Diagnosis not present

## 2024-04-23 DIAGNOSIS — I69351 Hemiplegia and hemiparesis following cerebral infarction affecting right dominant side: Secondary | ICD-10-CM | POA: Diagnosis not present

## 2024-04-23 DIAGNOSIS — N2581 Secondary hyperparathyroidism of renal origin: Secondary | ICD-10-CM | POA: Diagnosis present

## 2024-04-23 DIAGNOSIS — R739 Hyperglycemia, unspecified: Secondary | ICD-10-CM | POA: Diagnosis not present

## 2024-04-23 LAB — DIFFERENTIAL
Abs Immature Granulocytes: 0.02 K/uL (ref 0.00–0.07)
Basophils Absolute: 0 K/uL (ref 0.0–0.1)
Basophils Relative: 1 %
Eosinophils Absolute: 0.1 K/uL (ref 0.0–0.5)
Eosinophils Relative: 2 %
Immature Granulocytes: 1 %
Lymphocytes Relative: 27 %
Lymphs Abs: 0.8 K/uL (ref 0.7–4.0)
Monocytes Absolute: 0.4 K/uL (ref 0.1–1.0)
Monocytes Relative: 14 %
Neutro Abs: 1.6 K/uL — ABNORMAL LOW (ref 1.7–7.7)
Neutrophils Relative %: 55 %

## 2024-04-23 LAB — COMPREHENSIVE METABOLIC PANEL WITH GFR
ALT: 8 U/L (ref 0–44)
AST: 15 U/L (ref 15–41)
Albumin: 3.3 g/dL — ABNORMAL LOW (ref 3.5–5.0)
Alkaline Phosphatase: 48 U/L (ref 38–126)
Anion gap: 11 (ref 5–15)
BUN: 9 mg/dL (ref 6–20)
CO2: 29 mmol/L (ref 22–32)
Calcium: 8 mg/dL — ABNORMAL LOW (ref 8.9–10.3)
Chloride: 97 mmol/L — ABNORMAL LOW (ref 98–111)
Creatinine, Ser: 3.51 mg/dL — ABNORMAL HIGH (ref 0.44–1.00)
GFR, Estimated: 14 mL/min — ABNORMAL LOW (ref >60)
Glucose, Bld: 184 mg/dL — ABNORMAL HIGH (ref 70–99)
Potassium: 3.4 mmol/L — ABNORMAL LOW (ref 3.5–5.1)
Sodium: 137 mmol/L (ref 135–145)
Total Bilirubin: 0.9 mg/dL (ref 0.0–1.2)
Total Protein: 7.2 g/dL (ref 6.5–8.1)

## 2024-04-23 LAB — CBC
HCT: 37.8 % (ref 36.0–46.0)
Hemoglobin: 11.8 g/dL — ABNORMAL LOW (ref 12.0–15.0)
MCH: 30.2 pg (ref 26.0–34.0)
MCHC: 31.2 g/dL (ref 30.0–36.0)
MCV: 96.7 fL (ref 80.0–100.0)
Platelets: 163 K/uL (ref 150–400)
RBC: 3.91 MIL/uL (ref 3.87–5.11)
RDW: 16.7 % — ABNORMAL HIGH (ref 11.5–15.5)
WBC: 3 K/uL — ABNORMAL LOW (ref 4.0–10.5)
nRBC: 0 % (ref 0.0–0.2)

## 2024-04-23 LAB — PROTIME-INR
INR: 1.1 (ref 0.8–1.2)
Prothrombin Time: 14.3 s (ref 11.4–15.2)

## 2024-04-23 LAB — APTT: aPTT: 32 s (ref 24–36)

## 2024-04-23 LAB — CBG MONITORING, ED: Glucose-Capillary: 173 mg/dL — ABNORMAL HIGH (ref 70–99)

## 2024-04-23 LAB — ETHANOL: Alcohol, Ethyl (B): <15 mg/dL (ref <15)

## 2024-04-23 MED ORDER — LORAZEPAM 1 MG PO TABS
0.5000 mg | ORAL_TABLET | Freq: Once | ORAL | Status: AC
  Administered 2024-04-23: 0.5 mg via ORAL
  Filled 2024-04-23: qty 1

## 2024-04-23 MED ORDER — POTASSIUM CHLORIDE CRYS ER 20 MEQ PO TBCR
20.0000 meq | EXTENDED_RELEASE_TABLET | Freq: Once | ORAL | Status: AC
  Administered 2024-04-23: 20 meq via ORAL
  Filled 2024-04-23: qty 1

## 2024-04-23 NOTE — ED Provider Notes (Signed)
  Physical Exam  BP (!) 174/68   Pulse 87   Temp 98.7 F (37.1 C) (Oral)   Resp 18   SpO2 100%   Physical Exam Vitals and nursing note reviewed.  Constitutional:      General: She is not in acute distress.    Appearance: She is well-developed.  HENT:     Head: Normocephalic and atraumatic.  Eyes:     Conjunctiva/sclera: Conjunctivae normal.  Cardiovascular:     Rate and Rhythm: Normal rate and regular rhythm.     Pulses: Normal pulses.     Heart sounds: Normal heart sounds. No murmur heard. Pulmonary:     Effort: Pulmonary effort is normal. No respiratory distress.     Breath sounds: Normal breath sounds.  Abdominal:     Palpations: Abdomen is soft.     Tenderness: There is no abdominal tenderness.  Musculoskeletal:        General: No swelling.     Cervical back: Normal range of motion and neck supple. No rigidity.  Skin:    General: Skin is warm and dry.     Capillary Refill: Capillary refill takes less than 2 seconds.  Neurological:     Mental Status: She is alert.     Cranial Nerves: Cranial nerve deficit present.     Sensory: No sensory deficit.     Motor: Weakness present.     Comments: Left lateral nystagmus appreciated.  There is mild evidence of a left-sided facial droop appreciated.  4+/5 strength left upper and lower extremity in comparison to the right.  Sensation intact.  Slurred speech appreciated.  Psychiatric:        Mood and Affect: Mood normal.     Procedures  Procedures  ED Course / MDM   Clinical Course as of 04/24/24 0055  Mon Apr 23, 2024  1605 S, prev CVA, ESRD, R hemiparesis, LKN 8 am yes, a little off, dialysis today, words slurred there, vision off there. Diff seeing things in distance. Nystagmus w/ left gaze, R beating. Getting MRI. Likely chronic concerns, once MRI returns if something eval then consult neuro. Family takes care of her, able to take 5 steps w/ walker, does not ambulate. If stable, d/c. Finished complete dialysis session.  Speech is slow but not slurred on eval.  []  MRI  []  d/c  [BS]    Clinical Course User Index [BS] Arlee Katz, MD   Medical Decision Making Amount and/or Complexity of Data Reviewed Radiology: ordered.  Risk Prescription drug management. Decision regarding hospitalization.   Workup today, there is evidence of midbrain acute stroke on MRI.  There is also multiple areas of evidence of hypertensive encephalopathy concerns.  BP overall more stable in the ED than at home.  Discussed with neurology, recommend admission, will continue to consult and manage the patient following admission.  Discussed with hospitalist team, agreeable to admission.  Hemodynamically stable at time of admission.       Arlee Katz, MD 04/24/24 9942    Dasie Faden, MD 04/24/24 2041

## 2024-04-23 NOTE — ED Notes (Signed)
 Patient transported to MRI

## 2024-04-23 NOTE — H&P (Signed)
 History and Physical    Hannah [name] FMW:[id number] DOB: Mar 09, 1966 DOA: 04/23/2024  PCP: Delayne Artist PARAS, MD   Chief Complaint:    HPI: Hannah [name] is a 58 y.o. female with medical history significant of CAD status post PCI, CVA, ESRD on hemodialysis, hypertension, hyperlipidemia, prior stroke who presented to the emergency [facility] due to visual field changes and aphasia.  Patient's last known normal was 12/7.  She had a prior stroke and has residual right sided hemiparesis.  Patient was unable to provide history herself however other family present with her at bedside and noticed left-sided facial droop and decreased strength on the left so they brought her to the ER where she was found to be afebrile and hemodynamically stable.  Labs were obtained on presentation which showed INR 1.1, WBC 3.0, hemoglobin 11.8, creatinine 3.5, potassium 3.4.  Patient underwent CT head which showed no acute infarcts.  MRI brain demonstrated acute infarct in right dorsal midbrain.  Neurology was consulted and patient was admitted for stroke workup.  On assessment she had profound dysarthria and was unable to provide any history.  She had weakness on the left with residual right sided deficits.   Review of Systems: Review of Systems  Constitutional: Negative.   HENT: Negative.    Eyes: Negative.   Respiratory: Negative.    Cardiovascular: Negative.   Gastrointestinal: Negative.   Genitourinary: Negative.   Musculoskeletal: Negative.   Skin: Negative.   Endo/Heme/Allergies: Negative.   Psychiatric/Behavioral: Negative.       As per HPI otherwise 10 point review of systems negative.   Allergies  Allergen Reactions   Ibuprofen Swelling    Swelling to hands, feet and face    Influenza Vaccines Anaphylaxis and Swelling    fever   Pneumococcal Vaccine Anaphylaxis and Swelling    fever   Promethazine Other (See Comments)    hallucinations   Ambien  [Zolpidem ] Other (See Comments)    Unknown reaction    Codeine Itching   Cyclobenzaprine Other (See Comments)    hallucinations    Hydrocodone -Acetaminophen  Itching    Takes now and has not had many issues.   Tape Itching   Tramadol  Hcl Other (See Comments)    hallucinations   Wound Dressing Adhesive Itching    Use paper tape   Oxycodone  Nausea And Vomiting    Past Medical History:  Diagnosis Date   CAD S/P PCI x 2 2009   MI 2009 -> PCI x 2 ([place]); 01/2016 Nuc ST Nonischemic with Normal EF   Cerebral infarction (HCC) 07/12/2017   2009 first one, has had a total of 4 stokes- last 2021   Chronic kidney disease    Hemo M-W- F   Diabetes type 2, controlled (HCC)    Hemiparesis affecting right side as late effect of cerebrovascular accident (CVA) (HCC)    Hyperlipidemia    Hypertension    Intractable vomiting with nausea 07/12/2017   Multiple sclerosis 2009   Myocardial infarction [facility]) 2009   Stroke [facility])     Past Surgical History:  Procedure Laterality Date   A/V FISTULAGRAM Right 02/12/2021   Procedure: A/V FISTULAGRAM;  Surgeon: Gretta Lonni PARAS, MD;  [facility]: [facility] INVAS[facility] CV LAB;  Service: Cardiovascular;  Laterality: Right;   A/V FISTULAGRAM Right 05/20/2022   Procedure: A/V Fistulagram;  Surgeon: Gretta Lonni PARAS, MD;  Location: [facility] INVASIVE CV LAB;  Service: Cardiovascular;  Laterality: Right;   A/V FISTULAGRAM N/A 05/05/2023   Procedure: A/V Fistulagram;  Surgeon: Melia,  Lynwood ORN, MD;  Location: [facility] INVASIVE CV LAB;  Service: Cardiovascular;  Laterality: N/A;   A/V SHUNT INTERVENTION N/A 08/16/2023   Procedure: A/V SHUNT INTERVENTION;  Surgeon: Tobie Gordy POUR, MD;  Location: [facility] INVASIVE CV LAB;  Service: Cardiovascular;  Laterality: N/A;   A/V SHUNTOGRAM Right 09/26/2023   Procedure: A/V SHUNTOGRAM;  Surgeon: Pearline Norman RAMAN, MD;  Location: [facility] INVASIVE CV LAB;  Service: Cardiovascular;  Laterality: Right;   APPENDECTOMY     AV FISTULA PLACEMENT Right 10/02/2020   Procedure: RIGHT ARTERIOVENOUS (AV) FISTULA  CREATION;  Surgeon: Sheree Penne Bruckner, MD;  Location: [facility] OR;  Service: Vascular;  Laterality: Right;   AV FISTULA PLACEMENT Right 05/28/2022   Procedure: RIGHT ARM ARTERIOVENOUS (AV) FISTULA REVISION WITH GORETEX GRAFT;  Surgeon: Gretta Bruckner PARAS, MD;  Location: [facility] OR;  Service: Vascular;  Laterality: Right;   BASCILIC VEIN TRANSPOSITION Right 11/24/2020   Procedure: SECOND STAGE RIGHT ARM BASCILIC VEIN TRANSPOSITION;  Surgeon: Gretta Bruckner PARAS, MD;  Location: [facility] OR;  Service: Vascular;  Laterality: Right;   CYSTOSTOMY W/ BLADDER BIOPSY     INSERTION OF DIALYSIS CATHETER Right 05/28/2022   Procedure: INSERTION OF TUNNELED DIALYSIS CATHETER IN THE RIGHT INTERNAL JUGULAR;  Surgeon: Gretta Bruckner PARAS, MD;  Location: [facility] OR;  Service: Vascular;  Laterality: Right;   IR FLUORO GUIDE CV LINE RIGHT  09/29/2020   IR US  GUIDE VASC ACCESS RIGHT  09/29/2020   NM MYOVIEW  LTD  12/25/2015   [place], [place] - NORMAL /NEGATIVE for ischemia. Normal LV Fxn   OVARIAN CYST SURGERY     PERCUTANEOUS CORONARY STENT INTERVENTION (PCI-S)  2009   [place], [place] - 2 stents in settng of MI -- unknown details   PERIPHERAL VASCULAR BALLOON ANGIOPLASTY Right 02/12/2021   Procedure: PERIPHERAL VASCULAR BALLOON ANGIOPLASTY;  Surgeon: Gretta Bruckner PARAS, MD;  [facility];  Service: Cardiovascular;  Laterality: Right;  UPPER ARM FISTULA   PERIPHERAL VASCULAR BALLOON ANGIOPLASTY  05/20/2022   Procedure: PERIPHERAL VASCULAR BALLOON ANGIOPLASTY;  Surgeon: Gretta Bruckner PARAS, MD;  [facility];  Service: Cardiovascular;;  rt arm fistula site   PERIPHERAL VASCULAR BALLOON ANGIOPLASTY Right 01/28/2022   Procedure: PERIPHERAL VASCULAR BALLOON ANGIOPLASTY;  Surgeon: Gretta Bruckner PARAS, MD;  [facility];  Service: Cardiovascular;  Laterality: Right;  Arm fistula   PERIPHERAL VASCULAR BALLOON ANGIOPLASTY  05/05/2023   Procedure: PERIPHERAL VASCULAR BALLOON ANGIOPLASTY;   Surgeon: Melia Lynwood ORN, MD;  [facility]: [facility];  Service: Cardiovascular;;  RUA AVF   TRANSTHORACIC ECHOCARDIOGRAM  07/11/2017   a) 12/2015, [place], [place]): Moderate Conc LVH w/ sigmoid septum.  No LVOT obstruction.  Hyperdynamic LV fxn - EF 65 to 70%.  GR 1 DD & high LAP/LVEDP.  AoV Sclerosis - No AS;; b) 06/2017: [facility]): Normal LV Fxn - EF 60%.  Abnormal septal motion.  Mild LVH mild MAC.  Negative bubble study (for CVA)   TRANSTHORACIC ECHOCARDIOGRAM  12/2020   a) 05/2020: EF 60-65%. No RWMA. Gr2DD. Sm posterior pericardial effusion. No AS.; b) 09/2020: EF 70-75%.  Hyperdynamic.  No RWMA. ? D Fxn/LAP.  Mildly elevated PAP.  Pericardial effusion.  Mild ao V sclerosis cyst.; c) 12/2020 (Limited): EF 65-70%. No RWMA. Gr1-2 DD. Mild Conc LVH- high LAP. Nl PAP. Circumferential effusion w/o tamponade. AoV Sclerosis - no AS. Nl RAP.   ULTRASOUND GUIDANCE FOR VASCULAR ACCESS Right 05/28/2022   Procedure: ULTRASOUND GUIDANCE FOR VASCULAR ACCESS;  Surgeon: Gretta Bruckner  J, MD;  Location: [facility] OR;  Service: Vascular;  Laterality: Right;   VENOUS ANGIOPLASTY Right 08/16/2023   Procedure: VENOUS ANGIOPLASTY;  Surgeon: Tobie Gordy POUR, MD;  Location[facility] INVASIVE CV LAB;  Service: Cardiovascular;  Laterality: Right;  70% Innominate; 70% Outflow Basilic     reports that she has never smoked. She has never used smokeless tobacco. She reports that she does not currently use alcohol. She reports that she does not use drugs.  History reviewed. No pertinent family history.  Prior to Admission medications   Medication Sig Start Date End Date Taking? Authorizing Provider  acetaminophen  (TYLENOL ) 500 MG tablet Take 500 mg by mouth every 6 (six) hours as needed for mild pain (pain score 1-3), moderate pain (pain score 4-6), fever or headache.   Yes [provider]  amLODipine  (NORVASC ) 10 MG tablet Take 1 tablet (10 mg total) by mouth daily. Except on dialysis days do not take a  dose. 06/08/22  Yes Lavona Agent, MD  azaTHIOprine  (IMURAN ) 50 MG tablet Take 50 mg by mouth daily. 04/26/23 04/25/24 Yes [provider]  calcium  acetate (PHOSLO ) 667 MG capsule Take 667 mg by mouth in the morning, at noon, and at bedtime. 11/11/20  Yes [provider]  carvedilol  (COREG ) 25 MG tablet Take 1 tablet (25 mg total) by mouth 2 (two) times daily with a meal. And as directed. 06/08/22  Yes Lavona Agent, MD  clopidogrel  (PLAVIX ) 75 MG tablet Take 1 tablet (75 mg total) by mouth daily. 11/03/20  Yes Love, Sharlet RAMAN, PA-C  diclofenac  Sodium (VOLTAREN ) 1 % GEL Apply 2 g topically 4 (four) times daily. To leg Patient taking differently: Apply 2 g topically 4 (four) times daily as needed (pain). To leg 11/03/20  Yes Love, Sharlet RAMAN, PA-C  famotidine  (PEPCID ) 20 MG tablet Take 1 tablet (20 mg total) by mouth daily. 11/03/20  Yes Love, Sharlet RAMAN, PA-C  hydrALAZINE  (APRESOLINE ) 25 MG tablet Take 1 tablet (25 mg total) by mouth 2 (two) times daily. 12/20/23 04/23/24 Yes Anner Alm ORN, MD  HYDROcodone -acetaminophen  (NORCO/VICODIN) 5-325 MG tablet Take 1 tablet by mouth every 12 (twelve) hours as needed for moderate pain (pain score 4-6) or severe pain (pain score 7-10). 04/13/24  Yes [provider]  insulin  degludec (TRESIBA  FLEXTOUCH) 100 UNIT/ML FlexTouch Pen Inject 12 Units into the skin at bedtime. Patient taking differently: Inject 8-10 Units into the skin at bedtime. 11/03/20  Yes Love, Sharlet RAMAN, PA-C  insulin  lispro (HUMALOG ) 100 UNIT/ML KwikPen Inject 5 Units into the skin 3 (three) times daily. Patient taking differently: Inject 4-6 Units into the skin 3 (three) times daily. 11/04/20  Yes Love, Sharlet RAMAN, PA-C  isosorbide  mononitrate (IMDUR ) 30 MG 24 hr tablet Take 1 tablet (30 mg total) by mouth at bedtime. 12/20/23 04/23/24 Yes Anner Alm ORN, MD  lidocaine -prilocaine  (EMLA ) cream Apply 1 Application topically daily as needed (port access). 03/02/22  Yes [provider]  metoCLOPramide  (REGLAN ) 5 MG tablet Take 5 mg by mouth every 6 (six) hours as needed for nausea or vomiting. 09/16/21  Yes [provider]  naloxone (NARCAN) nasal spray 4 mg/0.1 mL Place 0.4 mg into the nose as needed.   Yes [provider]  nitroGLYCERIN  (NITROSTAT ) 0.4 MG SL tablet Place 1 tablet (0.4 mg total) under the tongue every 5 (five) minutes as needed for chest pain. 06/25/20  Yes Angiulli, Toribio PARAS, PA-C  ondansetron  (ZOFRAN ) 4 MG tablet Take 4 mg by mouth every  8 (eight) hours as needed for nausea or vomiting. 01/07/23  Yes [provider]  polyethylene glycol (MIRALAX  / GLYCOLAX ) 17 g packet Take 17 g by mouth daily. Patient taking differently: Take 17 g by mouth daily as needed for moderate constipation. 11/04/20  Yes Love, Sharlet RAMAN, PA-C  rosuvastatin  (CRESTOR ) 20 MG tablet Take 20 mg by mouth daily. 02/05/21  Yes [provider]  senna-docusate (SENOKOT-S) 8.6-50 MG tablet Take 1 tablet by mouth 2 (two) times daily as needed for moderate constipation. Patient taking differently: Take 1 tablet by mouth 2 (two) times daily as needed for mild constipation or moderate constipation. 11/03/20  Yes Love, Sharlet RAMAN, PA-C  Continuous Blood Gluc Sensor (FREESTYLE LIBRE 2 SENSOR) MISC USE AS DIRECTED CHANGE EVERY 14 DAYS 12/30/20   [provider]    Physical Exam: Vitals:   04/23/24 1545 04/23/24 1615 04/23/24 1847 04/23/24 2310  BP: (!) 167/67 (!) 168/65 (!) 164/66 (!) 159/117  Pulse: 88 81 87 85  Resp: 11 (!) 9 (!) 8 17  Temp:   98.2 F (36.8 C) 98.9 F (37.2 C)  TempSrc:    Oral  SpO2: 100% 100% 100% 100%   Physical Exam Constitutional:      Appearance: She is normal weight.  HENT:     Head: Normocephalic.     Nose: Nose normal.     Mouth/Throat:     Mouth: Mucous membranes are moist.     Pharynx: Oropharynx is clear.  Eyes:     Conjunctiva/sclera: Conjunctivae normal.     Pupils: Pupils are equal, round, and reactive  to light.  Cardiovascular:     Rate and Rhythm: Normal rate.     Pulses: Normal pulses.     Heart sounds: Normal heart sounds.  Pulmonary:     Effort: Pulmonary effort is normal.  Abdominal:     General: Abdomen is flat.     Palpations: Abdomen is soft.  Musculoskeletal:        General: Normal range of motion.     Cervical back: Normal range of motion.  Skin:    General: Skin is warm.     Capillary Refill: Capillary refill takes less than 2 seconds.  Neurological:     General: No focal deficit present.     Mental Status: She is alert and oriented to person, place, and time.     Motor: Weakness present.  Psychiatric:        Mood and Affect: Mood normal.       Labs on Admission: I have personally reviewed the patients's labs and imaging studies.  Assessment/Plan Principal Problem:   CVA (cerebral vascular accident) (HCC)   # Acute midbrain infarct - Patient presented left-sided deficits and speech changes - History of prior stroke - Outside of window for tPA - MR with acute stroke  Plan: Appreciate neurology recommendations Obtain echocardiogram Continue Plavix   Hyperlipidemia-continue rosuvastatin   # ESRD on hemodialysis-patient has no urgent acute indications for dialysis - Will need to engage nephrology in the morning regarding getting patient on dialysis schedule  # Hypertension-continue hydralazine , Imdur , Coreg   # Chorioretinitis-continue azathioprine   # CAD status post PCI-continue Plavix , Crestor , carvedilol   Admission status: Inpatient Telemetry  Certification: The appropriate patient status for this patient is INPATIENT. Inpatient status is judged to be reasonable and necessary in order to provide the required intensity of service to ensure the patient's safety. The patient's presenting symptoms, physical exam findings, and initial radiographic and laboratory data in the  context of their chronic comorbidities is felt to place them at high risk for  further clinical deterioration. Furthermore, it is not anticipated that the patient will be medically stable for discharge from the hospital within 2 midnights of admission.   * I certify that at the point of admission it is my clinical judgment that the patient will require inpatient hospital care spanning beyond 2 midnights from the point of admission due to high intensity of service, high risk for further deterioration and high frequency of surveillance required.DEWAINE Lamar Dess MD [facility] If 7PM-7AM, please contact night-coverage [email or web address]  04/24/2024, 2:36 AM

## 2024-04-23 NOTE — ED Provider Triage Note (Signed)
 Emergency Medicine Provider Triage Evaluation Note  Starnisha Batrez , a 58 y.o. female  was evaluated in triage.  Pt complains of vision change. H/O CVA with residual right deficits, loss of peripheral vision, last normal (best estimate) yesterday morning.  Review of Systems  Positive: Vision change, h/o CVA Negative: Denies speech change, new weakness  Physical Exam  BP (!) 174/68   Pulse 87   Resp 18   SpO2 100%  Gen:   Awake, no distress   Resp:  Normal effort  MSK:   Moves extremities without difficulty  Other:    Medical Decision Making  Medically screening exam initiated at 1:21 PM.  Appropriate orders placed.  Jaymi Tinner was informed that the remainder of the evaluation will be completed by another provider, this initial triage assessment does not replace that evaluation, and the importance of remaining in the ED until their evaluation is complete.  Patient unable to focus clearly. Peripheral vision significantly abnormal.   To go directly to room. Not a code stroke.    Odell Balls, PA-C 04/23/24 1322

## 2024-04-23 NOTE — ED Provider Notes (Signed)
 [place] EMERGENCY DEPARTMENT AT [facility] Provider Note   CSN: [id number] Arrival date & time: 04/23/24  1310     Patient presents with: Visual Field Change and Aphasia   Hannah [name] is a 58 y.o. female.   Patient's family reports that patient complained today at dialysis about having problems with her vision.  Patient has had a previous history of a CVA. Pt is reported to have last been normal yesterday morning at 8:00am. Pt denies any new weakness. Pt has been able to do her normal ambulation. (Pt walks 4-5 steps with a walker from wheel chair to bathroom with assistance) Pt denies any new left sided weakness.  Pt has right hemiparesis from previous cva.   The history is provided by the patient. No language interpreter was used.       Prior to Admission medications   Medication Sig Start Date End Date Taking? Authorizing Provider  acetaminophen  (TYLENOL ) 500 MG tablet Take 500 mg by mouth every 6 (six) hours as needed for mild pain (pain score 1-3), moderate pain (pain score 4-6), fever or headache.    [provider]  amLODipine  (NORVASC ) 10 MG tablet Take 1 tablet (10 mg total) by mouth daily. Except on dialysis days do not take a dose. 06/08/22   Lavona Agent, MD  azaTHIOprine  (IMURAN ) 50 MG tablet Take 50 mg by mouth daily. 04/26/23 04/25/24  [provider]  calcium  acetate (PHOSLO ) 667 MG capsule Take 667 mg by mouth in the morning, at noon, and at bedtime. 11/11/20   [provider]  carvedilol  (COREG ) 25 MG tablet Take 1 tablet (25 mg total) by mouth 2 (two) times daily with a meal. And as directed. 06/08/22   Lavona Agent, MD  clopidogrel  (PLAVIX ) 75 MG tablet Take 1 tablet (75 mg total) by mouth daily. 11/03/20   Love, Sharlet RAMAN, PA-C  Continuous Blood Gluc Sensor (FREESTYLE LIBRE 2 SENSOR) MISC USE AS DIRECTED CHANGE EVERY 14 DAYS 12/30/20   [provider]  diclofenac  Sodium (VOLTAREN ) 1 % GEL Apply 2 g topically 4 (four)  times daily. To leg Patient taking differently: Apply 2 g topically 4 (four) times daily as needed (pain). To leg 11/03/20   Love, Sharlet RAMAN, PA-C  famotidine  (PEPCID ) 20 MG tablet Take 1 tablet (20 mg total) by mouth daily. 11/03/20   Love, Sharlet RAMAN, PA-C  hydrALAZINE  (APRESOLINE ) 25 MG tablet Take 1 tablet (25 mg total) by mouth 2 (two) times daily. 12/20/23 03/19/24  Anner Alm ORN, MD  insulin  degludec (TRESIBA  FLEXTOUCH) 100 UNIT/ML FlexTouch Pen Inject 12 Units into the skin at bedtime. Patient taking differently: Inject 8 Units into the skin at bedtime. 11/03/20   Love, Sharlet RAMAN, PA-C  insulin  lispro (HUMALOG ) 100 UNIT/ML KwikPen Inject 5 Units into the skin 3 (three) times daily. Patient taking differently: Inject 4-6 Units into the skin 3 (three) times daily. 11/04/20   Love, Sharlet RAMAN, PA-C  isosorbide  mononitrate (IMDUR ) 30 MG 24 hr tablet Take 1 tablet (30 mg total) by mouth at bedtime. 12/20/23 03/19/24  Anner Alm ORN, MD  lidocaine -prilocaine  (EMLA ) cream Apply 1 Application topically daily as needed (port access). 03/02/22   [provider]  metoCLOPramide  (REGLAN ) 5 MG tablet Take 5 mg by mouth every 6 (six) hours as needed for nausea or vomiting. 09/16/21   [provider]  nitroGLYCERIN  (NITROSTAT ) 0.4 MG SL tablet Place 1 tablet (0.4 mg total) under the tongue every 5 (five) minutes as needed for  chest pain. 06/25/20   Angiulli, Toribio PARAS, PA-C  ondansetron  (ZOFRAN ) 4 MG tablet Take 4 mg by mouth every 8 (eight) hours as needed for nausea or vomiting. 01/07/23   [provider]  polyethylene glycol (MIRALAX  / GLYCOLAX ) 17 g packet Take 17 g by mouth daily. Patient taking differently: Take 17 g by mouth daily as needed for moderate constipation. 11/04/20   Love, Sharlet RAMAN, PA-C  rosuvastatin  (CRESTOR ) 20 MG tablet Take 20 mg by mouth daily. 02/05/21   [provider]  senna-docusate (SENOKOT-S) 8.6-50 MG tablet Take 1 tablet by mouth 2 (two) times daily as needed  for moderate constipation. Patient taking differently: Take 1 tablet by mouth 2 (two) times daily as needed for mild constipation or moderate constipation. 11/03/20   Love, Sharlet RAMAN, PA-C    Allergies: Ibuprofen, Influenza vaccines, Pneumococcal vaccine, Promethazine, Ambien  [zolpidem ], Codeine, Cyclobenzaprine, Hydrocodone -acetaminophen , Tape, Tramadol  hcl, Wound dressing adhesive, and Oxycodone     Review of Systems  Eyes:  Positive for visual disturbance.  All other systems reviewed and are negative.   Updated Vital Signs BP (!) 174/68   Pulse 87   Temp 98.7 F (37.1 C) (Oral)   Resp 18   SpO2 100%   Physical Exam Vitals and nursing note reviewed.  Constitutional:      Appearance: She is well-developed.  HENT:     Head: Normocephalic.     Right Ear: External ear normal.     Left Ear: External ear normal.     Nose: Nose normal.     Mouth/Throat:     Mouth: Mucous membranes are moist.  Eyes:     Comments: Left lateral nystagmus,   Cardiovascular:     Rate and Rhythm: Normal rate.  Pulmonary:     Effort: Pulmonary effort is normal.  Abdominal:     General: There is no distension.  Musculoskeletal:        General: Normal range of motion.     Cervical back: Normal range of motion.  Skin:    General: Skin is warm.  Neurological:     General: No focal deficit present.     Mental Status: She is alert and oriented to person, place, and time.  Psychiatric:        Mood and Affect: Mood normal.     (all labs ordered are listed, but only abnormal results are displayed) Labs Reviewed  CBC - Abnormal; Notable for the following components:      Result Value   WBC 3.0 (*)    Hemoglobin 11.8 (*)    RDW 16.7 (*)    All other components within normal limits  DIFFERENTIAL - Abnormal; Notable for the following components:   Neutro Abs 1.6 (*)    All other components within normal limits  COMPREHENSIVE METABOLIC PANEL WITH GFR - Abnormal; Notable for the following components:    Potassium 3.4 (*)    Chloride 97 (*)    Glucose, Bld 184 (*)    Creatinine, Ser 3.51 (*)    Calcium  8.0 (*)    Albumin 3.3 (*)    GFR, Estimated 14 (*)    All other components within normal limits  CBG MONITORING, ED - Abnormal; Notable for the following components:   Glucose-Capillary 173 (*)    All other components within normal limits  PROTIME-INR  APTT  ETHANOL  RAPID URINE DRUG SCREEN, HOSP PERFORMED    EKG: None  Radiology: CT HEAD WO CONTRAST Result Date: 04/23/2024 EXAM: CT HEAD  WITHOUT CONTRAST 04/23/2024 02:19:26 PM TECHNIQUE: CT of the head was performed without the administration of intravenous contrast. Automated exposure control, iterative reconstruction, and/or weight based adjustment of the mA/kV was utilized to reduce the radiation dose to as low as reasonably achievable. COMPARISON: 09/23/2023 CLINICAL HISTORY: Neuro deficit, acute, stroke suspected. FINDINGS: BRAIN AND VENTRICLES: No acute hemorrhage. No evidence of acute infarct. Patchy and confluent decreased attenuation throughout deep and periventricular white matter of cerebral hemispheres bilaterally, compatible with chronic microvascular ischemic disease. Chronic right corona radiata, left basal ganglia lacunar infarcts. No hydrocephalus. No extra-axial collection. No mass effect or midline shift. ORBITS: Bilateral lens replacement. SINUSES: Left sphenoid sinus mucosal thickening. SOFT TISSUES AND SKULL: No acute soft tissue abnormality. No skull fracture. VASCULATURE: Atherosclerotic calcifications within cavernous internal carotid arteries. IMPRESSION: 1. No acute intracranial abnormality. 2. Moderate chronic microvascular ischemic disease. Chronic right corona radiata and left basal ganglia lacunar infarcts. Electronically signed by: Donnice Mania MD 04/23/2024 02:41 PM EST RP Workstation: [id number]     Procedures   Medications Ordered in the ED - No data to display                                   Medical Decision Making Patient reports that she has had visual changes.  Patient reports that she did not notice any change until she was at dialysis today.  Patient's family reports that she has some slurred speech.  Patient was last normal yesterday at 8 AM.  Patient has had a previous CVA and has right sided hemiparesis.  Patient's daughter reports she has noticed that patient's eyes are rolling around..  Patient did complete her dialysis today  Amount and/or Complexity of Data Reviewed Independent Historian:     Details: Patient is here with her son and daughter who help with history Labs: ordered. Decision-making details documented in ED Course.    Details: Labs ordered reviewed and interpreted.  Creatinine is 3.51.  Potassium is 3.4.  Hemoglobin is 11.8 Radiology: ordered.    Details: CT head ordered reviewed and interpreted CT shows no acute intercranial abnormality chronic microvascular ischemic disease chronic right corona radiata and left basal ganglia lacunar infarcts.  Risk Prescription drug management.        Final diagnoses:  Visual disturbance    ED Discharge Orders     None      Dr. Arlee will assume pt's care.     Flint Sonny POUR, PA-C 04/23/24 1600    Levander [place], MD 04/23/24 [phone]

## 2024-04-23 NOTE — Consult Note (Signed)
 NEUROLOGY CONSULT NOTE   Date of service: April 23, 2024 Patient Name: Hannah [name] MRN:  [id number] DOB:  03/28/1966 Chief Complaint: Vision problems Requesting Provider: Dena Charleston, MD  History of Present Illness  Hannah [name] is a 58 y.o. female with a PMHx of CAD s/p PCI x 2, 4 strokes with chronic dysarthria and right sided weakness, CKD, DM2, HLD, HTN and multiple sclerosis who presents with new onset of worsened vision and eye movement problems. Her daughter states that the patient's baseline weakness was unchanged, with her only new symptoms involving her eyes. She went to went to dialysis this morning and was told to come here for evaluation of stroke like symptoms. Per her family, symptoms started sometime last night with best estimate of LKW being 0800 yesterday. She had lost the peripheral vision of both eyes. Family felt that there was a slight left facial droop relative to her right-sided droop and possible slurred speech. At her baseline she uses a wheelchair to ambulate and can walk 4-5 steps with a walker from wheel chair to bathroom with assistance. She does not endorse any additional complaints.   Home medications include Plavix  and Crestor .    ROS  Comprehensive ROS performed and pertinent positives documented in HPI    Past History   Past Medical History:  Diagnosis Date   CAD S/P PCI x 2 2009   MI 2009 -> PCI x 2 ([place]); 01/2016 Nuc ST Nonischemic with Normal EF   Cerebral infarction (HCC) 07/12/2017   2009 first one, has had a total of 4 stokes- last 2021   Chronic kidney disease    Hemo M-W- F   Diabetes type 2, controlled (HCC)    Hemiparesis affecting right side as late effect of cerebrovascular accident (CVA) (HCC)    Hyperlipidemia    Hypertension    Intractable vomiting with nausea 07/12/2017   Multiple sclerosis 2009   Myocardial infarction [facility]) 2009   Stroke [facility])     Past Surgical History:  Procedure Laterality Date   A/V FISTULAGRAM  Right 02/12/2021   Procedure: A/V FISTULAGRAM;  Surgeon: Gretta Lonni PARAS, MD;  [facility]: [facility] INVASIVE CV LAB;  Service: Cardiovascular;  Laterality: Right;   A/V FISTULAGRAM Right 05/20/2022   Procedure: A/V Fistulagram;  Surgeon: Gretta Lonni PARAS, MD;  Location: [facility] INVASIVE CV LAB;  Service: Cardiovascular;  Laterality: Right;   A/V FISTULAGRAM N/A 05/05/2023   Procedure: A/V Fistulagram;  Surgeon: Melia Lynwood ORN, MD;  Location: [facility] INVASIVE CV LAB;  Service: Cardiovascular;  Laterality: N/A;   A/V SHUNT INTERVENTION N/A 08/16/2023   Procedure: A/V SHUNT INTERVENTION;  Surgeon: Tobie Gordy POUR, MD;  Location: [facility] INVASIVE CV LAB;  Service: Cardiovascular;  Laterality: N/A;   A/V SHUNTOGRAM Right 09/26/2023   Procedure: A/V SHUNTOGRAM;  Surgeon: Pearline Norman RAMAN, MD;  Location: [facility] INVASIVE CV LAB;  Service: Cardiovascular;  Laterality: Right;   APPENDECTOMY     AV FISTULA PLACEMENT Right 10/02/2020   Procedure: RIGHT ARTERIOVENOUS (AV) FISTULA CREATION;  Surgeon: Sheree Penne Lonni, MD;  Location: [facility] OR;  Service: Vascular;  Laterality: Right;   AV FISTULA PLACEMENT Right 05/28/2022   Procedure: RIGHT ARM ARTERIOVENOUS (AV) FISTULA REVISION WITH GORETEX GRAFT;  Surgeon: Gretta Lonni PARAS, MD;  Location: [facility] OR;  Service: Vascular;  Laterality: Right;   BASCILIC VEIN TRANSPOSITION Right 11/24/2020   Procedure: SECOND STAGE RIGHT ARM BASCILIC VEIN TRANSPOSITION;  Surgeon: Gretta Lonni PARAS, MD;  Location: [facility] OR;  Service: Vascular;  Laterality: Right;  CYSTOSTOMY W/ BLADDER BIOPSY     INSERTION OF DIALYSIS CATHETER Right 05/28/2022   Procedure: INSERTION OF TUNNELED DIALYSIS CATHETER IN THE RIGHT INTERNAL JUGULAR;  Surgeon: Gretta Lonni PARAS, MD;  Location: [facility] OR;  Service: Vascular;  Laterality: Right;   IR FLUORO GUIDE CV LINE RIGHT  09/29/2020   IR US  GUIDE VASC ACCESS RIGHT  09/29/2020   NM MYOVIEW  LTD  12/25/2015   [place], [place] - NORMAL /NEGATIVE for ischemia. Normal LV  Fxn   OVARIAN CYST SURGERY     PERCUTANEOUS CORONARY STENT INTERVENTION (PCI-S)  2009   [place], [place] - 2 stents in settng of MI -- unknown details   PERIPHERAL VASCULAR BALLOON ANGIOPLASTY Right 02/12/2021   Procedure: PERIPHERAL VASCULAR BALLOON ANGIOPLASTY;  Surgeon: Gretta Lonni PARAS, MD;  [facility];  Service: Cardiovascular;  Laterality: Right;  UPPER ARM FISTULA   PERIPHERAL VASCULAR BALLOON ANGIOPLASTY  05/20/2022   Procedure: PERIPHERAL VASCULAR BALLOON ANGIOPLASTY;  Surgeon: Gretta Lonni PARAS, MD;  [facility];  Service: Cardiovascular;;  rt arm fistula site   PERIPHERAL VASCULAR BALLOON ANGIOPLASTY Right 01/28/2022   Procedure: PERIPHERAL VASCULAR BALLOON ANGIOPLASTY;  Surgeon: Gretta Lonni PARAS, MD;  [facility];  Service: Cardiovascular;  Laterality: Right;  Arm fistula   PERIPHERAL VASCULAR BALLOON ANGIOPLASTY  05/05/2023   Procedure: PERIPHERAL VASCULAR BALLOON ANGIOPLASTY;  Surgeon: Melia Lynwood ORN, MD;  [facility]: [facility] INVASIVE CV LAB;  Service: Cardiovascular;;  RUA AVF   TRANSTHORACIC ECHOCARDIOGRAM  07/11/2017   a) 12/2015, [place], [place]): Moderate Conc LVH w/ sigmoid septum.  No LVOT obstruction.  Hyperdynamic LV fxn - EF 65 to 70%.  GR 1 DD & high LAP/LVEDP.  AoV Sclerosis - No AS;; b) 06/2017: [facility]): Normal LV Fxn - EF 60%.  Abnormal septal motion.  Mild LVH mild MAC.  Negative bubble study (for CVA)   TRANSTHORACIC ECHOCARDIOGRAM  12/2020   a) 05/2020: EF 60-65%. No RWMA. Gr2DD. Sm posterior pericardial effusion. No AS.; b) 09/2020: EF 70-75%.  Hyperdynamic.  No RWMA. ? D Fxn/LAP.  Mildly elevated PAP.  Pericardial effusion.  Mild ao V sclerosis cyst.; c) 12/2020 (Limited): EF 65-70%. No RWMA. Gr1-2 DD. Mild Conc LVH- high LAP. Nl PAP. Circumferential effusion w/o tamponade. AoV Sclerosis - no AS. Nl RAP.   ULTRASOUND GUIDANCE FOR VASCULAR ACCESS Right 05/28/2022   Procedure: ULTRASOUND GUIDANCE FOR  VASCULAR ACCESS;  Surgeon: Gretta Lonni PARAS, MD;  Location: [facility] OR;  Service: Vascular;  Laterality: Right;   VENOUS ANGIOPLASTY Right 08/16/2023   Procedure: VENOUS ANGIOPLASTY;  Surgeon: Tobie Gordy POUR, MD;  Location: [facility] INVASIVE CV LAB;  Service: Cardiovascular;  Laterality: Right;  70% Innominate; 70% Outflow Basilic    Family History: History reviewed. No pertinent family history.  Social History  reports that she has never smoked. She has never used smokeless tobacco. She reports that she does not currently use alcohol. She reports that she does not use drugs.  Allergies  Allergen Reactions   Ibuprofen Swelling    Swelling to hands, feet and face    Influenza Vaccines Anaphylaxis and Swelling    fever   Pneumococcal Vaccine Anaphylaxis and Swelling    fever   Promethazine Other (See Comments)    hallucinations   Ambien  [Zolpidem ] Other (See Comments)    Unknown reaction   Codeine Itching   Cyclobenzaprine Other (See Comments)    hallucinations    Hydrocodone -Acetaminophen  Itching   Tape Itching   Tramadol  Hcl  Other (See Comments)    hallucinations   Wound Dressing Adhesive Itching    Use paper tape   Oxycodone  Nausea And Vomiting    Medications  No current facility-administered medications for this encounter.  Current Outpatient Medications:    acetaminophen  (TYLENOL ) 500 MG tablet, Take 500 mg by mouth every 6 (six) hours as needed for mild pain (pain score 1-3), moderate pain (pain score 4-6), fever or headache., Disp: , Rfl:    amLODipine  (NORVASC ) 10 MG tablet, Take 1 tablet (10 mg total) by mouth daily. Except on dialysis days do not take a dose., Disp: 30 tablet, Rfl: 0   azaTHIOprine  (IMURAN ) 50 MG tablet, Take 50 mg by mouth daily., Disp: , Rfl:    calcium  acetate (PHOSLO ) 667 MG capsule, Take 667 mg by mouth in the morning, at noon, and at bedtime., Disp: , Rfl:    carvedilol  (COREG ) 25 MG tablet, Take 1 tablet (25 mg total) by mouth 2 (two) times daily  with a meal. And as directed., Disp: 60 tablet, Rfl: 0   clopidogrel  (PLAVIX ) 75 MG tablet, Take 1 tablet (75 mg total) by mouth daily., Disp: 30 tablet, Rfl: 0   Continuous Blood Gluc Sensor (FREESTYLE LIBRE 2 SENSOR) MISC, USE AS DIRECTED CHANGE EVERY 14 DAYS, Disp: , Rfl:    diclofenac  Sodium (VOLTAREN ) 1 % GEL, Apply 2 g topically 4 (four) times daily. To leg (Patient taking differently: Apply 2 g topically 4 (four) times daily as needed (pain). To leg), Disp: 350 g, Rfl: 0   famotidine  (PEPCID ) 20 MG tablet, Take 1 tablet (20 mg total) by mouth daily., Disp: 30 tablet, Rfl: 0   hydrALAZINE  (APRESOLINE ) 25 MG tablet, Take 1 tablet (25 mg total) by mouth 2 (two) times daily., Disp: 180 tablet, Rfl: 3   insulin  degludec (TRESIBA  FLEXTOUCH) 100 UNIT/ML FlexTouch Pen, Inject 12 Units into the skin at bedtime. (Patient taking differently: Inject 8 Units into the skin at bedtime.), Disp: 15 mL, Rfl: 0   insulin  lispro (HUMALOG ) 100 UNIT/ML KwikPen, Inject 5 Units into the skin 3 (three) times daily. (Patient taking differently: Inject 4-6 Units into the skin 3 (three) times daily.), Disp: 15 mL, Rfl: 0   isosorbide  mononitrate (IMDUR ) 30 MG 24 hr tablet, Take 1 tablet (30 mg total) by mouth at bedtime., Disp: 90 tablet, Rfl: 3   lidocaine -prilocaine  (EMLA ) cream, Apply 1 Application topically daily as needed (port access)., Disp: , Rfl:    metoCLOPramide  (REGLAN ) 5 MG tablet, Take 5 mg by mouth every 6 (six) hours as needed for nausea or vomiting., Disp: , Rfl:    nitroGLYCERIN  (NITROSTAT ) 0.4 MG SL tablet, Place 1 tablet (0.4 mg total) under the tongue every 5 (five) minutes as needed for chest pain., Disp: 30 tablet, Rfl: 12   ondansetron  (ZOFRAN ) 4 MG tablet, Take 4 mg by mouth every 8 (eight) hours as needed for nausea or vomiting., Disp: , Rfl:    polyethylene glycol (MIRALAX  / GLYCOLAX ) 17 g packet, Take 17 g by mouth daily. (Patient taking differently: Take 17 g by mouth daily as needed for  moderate constipation.), Disp: 30 each, Rfl: 0   rosuvastatin  (CRESTOR ) 20 MG tablet, Take 20 mg by mouth daily., Disp: , Rfl:    senna-docusate (SENOKOT-S) 8.6-50 MG tablet, Take 1 tablet by mouth 2 (two) times daily as needed for moderate constipation. (Patient taking differently: Take 1 tablet by mouth 2 (two) times daily as needed for mild constipation or moderate constipation.), Disp: 60  tablet, Rfl: 0  Vitals   Vitals:   05-11-2024 1530 05/11/24 1545 May 11, 2024 1615 05/11/2024 1847  BP: (!) 168/86 (!) 167/67 (!) 168/65 (!) 164/66  Pulse: 87 88 81 87  Resp: 14 11 (!) 9 (!) 8  Temp:    98.2 F (36.8 C)  TempSrc:      SpO2: 100% 100% 100% 100%    There is no height or weight on file to calculate BMI.   Physical Exam   Constitutional: Frail-appearing, NAD  Psych: Blunted affect.  Eyes: No scleral injection.  HENT: No OP obstruction.  Head: Normocephalic.  Respiratory: Effort normal, non-labored breathing.  Skin: WDI.   Neurologic Examination   Mental Status: Awake, alert and oriented. Thought content appropriate. Speech fluent without evidence of aphasia. Dysarthria noted. Able to follow all commands without difficulty. Cranial Nerves: II: Constriction of visual fields in both eyes with some sparing of central vision bilaterally, but has decreased visual acuity, worse on the right. Left pupil 3 mm and reactive, right pupil 4 mm and reactive. III,IV, VI: No ptosis. Horizontal EOM are intact but has some hesitancy with tracking to the left. There is multidirectional nystagmus of left eye in central and lateral gaze as well as when attempting to gaze upwards. There is vertical gaze paresis bilaterally.  V: Temp sensation equal bilaterally VII: Right facial droop.  VIII: Hearing intact to voice IX,X: No hypophonia or hoarseness XI: Symmetric XII: Midline tongue extension Motor: RUE: 4-/5 at deltoid, biceps, triceps and with grip. Decreased ROM at shoulder and elbow with flexion  contracture at elbow.  LUE: 4+/5 RLE: 4/5 proximally and distally.  LLE: 4+/5  Sensory: Temp and FT intact x 4. No extinction to DSS. Deep Tendon Reflexes: 4+ right biceps, brachioradialis and triceps. 3+ left biceps, 2+ left brachioradialis and triceps. 3+ low-amplitude bilateral patellars. Negative Hoffman's sign bilaterally.  Cerebellar: No ataxia with FNF bilaterally, but slow worse on the right. Also with difficulty targeting her finger in the context of her new vision impairment. H-S is slow bilaterally.  Gait: Deferred   Labs/Imaging/Neurodiagnostic studies   CBC:  Recent Labs  Lab 05/11/2024 1344  WBC 3.0*  NEUTROABS 1.6*  HGB 11.8*  HCT 37.8  MCV 96.7  PLT 163   Basic Metabolic Panel:  Lab Results  Component Value Date   NA 137 05/11/2024   K 3.4 (L) 05-11-24   CO2 29 05/11/2024   GLUCOSE 184 (H) 05/11/24   BUN 9 2024/05/11   CREATININE 3.51 (H) 11-May-2024   CALCIUM  8.0 (L) May 11, 2024   GFRNONAA 14 (L) 11-May-2024   Lipid Panel:  Lab Results  Component Value Date   LDLCALC 41 04/01/2022   HgbA1c:  Lab Results  Component Value Date   HGBA1C 7.5 (H) 04/01/2022   Urine Drug Screen:     Component Value Date/Time   LABOPIA NONE DETECTED 01/12/2021 2100   COCAINSCRNUR NONE DETECTED 01/12/2021 2100   LABBENZ NONE DETECTED 01/12/2021 2100   AMPHETMU NONE DETECTED 01/12/2021 2100   THCU NONE DETECTED 01/12/2021 2100   LABBARB NONE DETECTED 01/12/2021 2100    Alcohol Level     Component Value Date/Time   ETH <15 05-11-2024 1344   INR  Lab Results  Component Value Date   INR 1.1 May 11, 2024   APTT  Lab Results  Component Value Date   APTT 32 2024/05/11   Prior CTA of head and neck (05/05/22):  1. No significant carotid or vertebral artery stenosis in the neck. 2. No  intracranial large vessel occlusion. Mild-to-moderate stenosis proximal right P2 segment.  Prior TTE (01/13/21):  1. Left ventricular ejection fraction, by estimation, is 65 to 70%.  The  left ventricle has normal function. The left ventricle has no regional  wall motion abnormalities. There is mild concentric left ventricular  hypertrophy. Left ventricular diastolic parameters are consistent  with Grade I diastolic dysfunction (impaired relaxation).  Elevated left atrial pressure.   2. Right ventricular systolic function is normal. The right ventricular  size is normal. There is normal pulmonary artery systolic pressure.   3. A small pericardial effusion is present. The pericardial effusion is  circumferential. There is no evidence of cardiac tamponade.   4. The mitral valve is normal in structure. No evidence of mitral valve  regurgitation. No evidence of mitral stenosis.   5. The aortic valve is tricuspid. There is mild thickening of the aortic  valve. Aortic valve regurgitation is trivial. Mild aortic valve sclerosis  is present, with no evidence of aortic valve stenosis.   6. The inferior vena cava is normal in size with greater than 50%  respiratory variability, suggesting right atrial pressure of 3 mmHg.    ASSESSMENT  58 y.o. female with a PMHx of CAD s/p PCI x 2, 4 strokes with chronic dysarthria and right sided weakness, CKD, DM2, HLD, HTN and multiple sclerosis who presents with new onset of worsened vision and eye movement problems. Her daughter states that the patient's baseline weakness was unchanged, with her only new symptoms involving her eyes. She went to went to dialysis this morning and was told to come here for evaluation of stroke like symptoms. Per her family, symptoms started sometime last night with best estimate of LKW being 0800 yesterday. She had lost the peripheral vision of both eyes. Family felt that there was a slight left facial droop relative to her right-sided droop and possible slurred speech. At her baseline she uses a wheelchair to ambulate and can walk 4-5 steps with a walker from wheel chair to bathroom with assistance. She does not  endorse any additional complaints. Home medications include Plavix  and Crestor .  - Exam reveals chronic right sided weakness with hyperreflexia, worse to RUE (chronic), right facial droop (chronic), dysarthria (chronic) and ocular motility deficits with nystagmus of the left eye (new), in addition to constricted visual fields and decreased central visual acuity (new).  - CT head:  No acute intracranial abnormality. Moderate chronic microvascular ischemic disease. Chronic right corona radiata and left basal ganglia lacunar infarcts. - MRI brain: Acute infarct in the right dorsal midbrain measuring 4 mm, partially involving the periaqueductal gray matter. Multiple scattered chronic microhemorrhages, particularly within the thalami and brainstem, likely related to hypertensive encephalopathy. Moderate chronic microvascular ischemic changes, mild generalized parenchymal volume loss, remote lacunar infarcts scattered within the pons, in addition to remote left BG small infarction. - EKG: Normal sinus rhythm Possible Anterior infarct , age undetermined  - Impression:  - Acute right midbrain stroke with new ocular motility deficits and vision impairment - History of strokes x 4 with chronic right sided weakness, including RUE elbow contracture, dysarthria and right facial weakness - Listed history of MS. Her MRI on personal review does not militate strongly in favor of this diagnosis with scattered chronic brainstem lacunar infarctions and supratentorial white matter lesions appearing more consistent with chronic ischemic microvascular disease.  - Chronic microhemorrhages on MRI, appearing most likely to be secondary to chronic uncontrolled HTN  RECOMMENDATIONS  - HgbA1c, fasting lipid panel -  MRA head - Carotid ultrasound - PT consult, OT consult, Speech consult - TTE - Continue her Crestor  - Add ASA to Plavix  - Risk factor modification - HD schedule and fluid management inpatient per Nephrology -  Telemetry monitoring - Frequent neuro checks - NPO until passes stroke swallow screen - BP management per standard protocol, with Nephrology input. Out of the permissive HTN time window - Serum vitamin D level ______________________________________________________________________    Bonney SHARK, Vada Swift, MD [facility]

## 2024-04-23 NOTE — ED Triage Notes (Signed)
 Pt POV with family, went to dialysis this morning and was told to come here for evaluation of stroke like symptoms. Per pts family symptoms started sometime last night but their confirmed LKW 0800 yesterday. Pt unable to see out of her peripheral vision out of both eyes. Slight left droop and possible slurred speech. Pt has hx of strokes

## 2024-04-24 ENCOUNTER — Inpatient Hospital Stay (HOSPITAL_COMMUNITY)

## 2024-04-24 DIAGNOSIS — I639 Cerebral infarction, unspecified: Secondary | ICD-10-CM | POA: Diagnosis present

## 2024-04-24 DIAGNOSIS — J9601 Acute respiratory failure with hypoxia: Secondary | ICD-10-CM | POA: Diagnosis present

## 2024-04-24 LAB — ECHOCARDIOGRAM COMPLETE
AR max vel: 3.01 cm2
AV Area VTI: 3.72 cm2
AV Area mean vel: 2.61 cm2
AV Mean grad: 3 mmHg
AV Peak grad: 5.3 mmHg
Ao pk vel: 1.15 m/s
Area-P 1/2: 5.58 cm2
Calc EF: 73.6 %
MV VTI: 2.76 cm2
S' Lateral: 1.9 cm
Single Plane A2C EF: 74.4 %
Single Plane A4C EF: 71.5 %
Weight: 1633.17 [oz_av]

## 2024-04-24 LAB — HEMOGLOBIN A1C
Hgb A1c MFr Bld: 8.5 % — ABNORMAL HIGH (ref 4.8–5.6)
Mean Plasma Glucose: 197.25 mg/dL

## 2024-04-24 LAB — LIPID PANEL
Cholesterol: 111 mg/dL (ref 0–200)
HDL: 72 mg/dL (ref >40)
LDL Cholesterol: 30 mg/dL (ref 0–99)
Total CHOL/HDL Ratio: 1.5 ratio
Triglycerides: 44 mg/dL (ref <150)
VLDL: 9 mg/dL (ref 0–40)

## 2024-04-24 LAB — GLUCOSE, CAPILLARY: Glucose-Capillary: 171 mg/dL — ABNORMAL HIGH (ref 70–99)

## 2024-04-24 LAB — CBG MONITORING, ED
Glucose-Capillary: 303 mg/dL — ABNORMAL HIGH (ref 70–99)
Glucose-Capillary: 255 mg/dL — ABNORMAL HIGH (ref 70–99)

## 2024-04-24 LAB — PLATELET INHIBITION P2Y12: Platelet Function  P2Y12: 229 [PRU] (ref 182–335)

## 2024-04-24 MED ORDER — ASPIRIN 81 MG PO TBEC
81.0000 mg | DELAYED_RELEASE_TABLET | Freq: Every day | ORAL | Status: DC
  Administered 2024-04-24 – 2024-04-30 (×7): 81 mg via ORAL
  Filled 2024-04-24 (×7): qty 1

## 2024-04-24 MED ORDER — ISOSORBIDE MONONITRATE ER 30 MG PO TB24
30.0000 mg | ORAL_TABLET | Freq: Every day | ORAL | Status: DC
  Administered 2024-04-24 – 2024-04-29 (×5): 30 mg via ORAL
  Filled 2024-04-24 (×5): qty 1

## 2024-04-24 MED ORDER — IOHEXOL 350 MG/ML SOLN
75.0000 mL | Freq: Once | INTRAVENOUS | Status: AC | PRN
  Administered 2024-04-24: 75 mL via INTRAVENOUS

## 2024-04-24 MED ORDER — INSULIN GLARGINE 100 UNIT/ML ~~LOC~~ SOLN
10.0000 [IU] | Freq: Every day | SUBCUTANEOUS | Status: DC
  Administered 2024-04-24 – 2024-04-29 (×5): 10 [IU] via SUBCUTANEOUS
  Filled 2024-04-24 (×8): qty 0.1

## 2024-04-24 MED ORDER — AZATHIOPRINE 50 MG PO TABS
50.0000 mg | ORAL_TABLET | Freq: Every day | ORAL | Status: DC
  Administered 2024-04-24 – 2024-04-30 (×7): 50 mg via ORAL
  Filled 2024-04-24 (×8): qty 1

## 2024-04-24 MED ORDER — CARVEDILOL 12.5 MG PO TABS
25.0000 mg | ORAL_TABLET | Freq: Two times a day (BID) | ORAL | Status: DC
  Administered 2024-04-24 – 2024-04-29 (×11): 25 mg via ORAL
  Filled 2024-04-24 (×11): qty 2

## 2024-04-24 MED ORDER — CHLORHEXIDINE GLUCONATE CLOTH 2 % EX PADS
6.0000 | MEDICATED_PAD | Freq: Every day | CUTANEOUS | Status: DC
  Administered 2024-04-25 – 2024-04-30 (×5): 6 via TOPICAL

## 2024-04-24 MED ORDER — INSULIN GLARGINE-YFGN 100 UNIT/ML ~~LOC~~ SOPN: 10.0000 [IU] | PEN_INJECTOR | SUBCUTANEOUS | Status: DC

## 2024-04-24 MED ORDER — STROKE: EARLY STAGES OF RECOVERY BOOK
Freq: Once | Status: AC
  Filled 2024-04-24: qty 1

## 2024-04-24 MED ORDER — CLOPIDOGREL BISULFATE 75 MG PO TABS
75.0000 mg | ORAL_TABLET | Freq: Every day | ORAL | Status: DC
  Administered 2024-04-24 – 2024-04-25 (×2): 75 mg via ORAL
  Filled 2024-04-24 (×2): qty 1

## 2024-04-24 MED ORDER — HYDRALAZINE HCL 25 MG PO TABS
25.0000 mg | ORAL_TABLET | Freq: Two times a day (BID) | ORAL | Status: DC
  Administered 2024-04-24 – 2024-04-26 (×4): 25 mg via ORAL
  Filled 2024-04-24 (×4): qty 1

## 2024-04-24 MED ORDER — ROSUVASTATIN CALCIUM 20 MG PO TABS
20.0000 mg | ORAL_TABLET | Freq: Every day | ORAL | Status: DC
  Administered 2024-04-24 – 2024-04-30 (×7): 20 mg via ORAL
  Filled 2024-04-24 (×7): qty 1

## 2024-04-24 MED ORDER — INSULIN ASPART 100 UNIT/ML IJ SOLN
0.0000 [IU] | Freq: Three times a day (TID) | INTRAMUSCULAR | Status: AC
  Administered 2024-04-24: 4 [IU] via SUBCUTANEOUS
  Administered 2024-04-24 – 2024-04-25 (×2): 3 [IU] via SUBCUTANEOUS
  Administered 2024-04-25: 2 [IU] via SUBCUTANEOUS
  Filled 2024-04-24: qty 4
  Filled 2024-04-24: qty 2
  Filled 2024-04-24 (×2): qty 3

## 2024-04-24 MED ORDER — ENOXAPARIN SODIUM 40 MG/0.4ML IJ SOSY: 40.0000 mg | PREFILLED_SYRINGE | INTRAMUSCULAR | Status: DC

## 2024-04-24 MED ORDER — HEPARIN SODIUM (PORCINE) 5000 UNIT/ML IJ SOLN
5000.0000 [IU] | Freq: Three times a day (TID) | INTRAMUSCULAR | Status: DC
  Administered 2024-04-24 – 2024-04-30 (×20): 5000 [IU] via SUBCUTANEOUS
  Filled 2024-04-24 (×20): qty 1

## 2024-04-24 MED ORDER — MELATONIN 3 MG PO TABS
3.0000 mg | ORAL_TABLET | Freq: Every evening | ORAL | Status: DC | PRN
  Administered 2024-04-25: 3 mg via ORAL
  Filled 2024-04-24: qty 1

## 2024-04-24 NOTE — Progress Notes (Signed)
   Brief Progress Note   _____________________________________________________________________________________________________________  Patient Name: Shantae Vantol Patient DOB: 06/18/1965 Date: @TODAY @      Data: Reviewed labs, vital signs, and notes.    Action: No action required at this time.    Response:    _____________________________________________________________________________________________________________  [facility] Shyloh Krinke S Naijah Lacek Please contact us  directly via secure chat (search for [facility]) or by calling us  at [phone] [facility]).

## 2024-04-24 NOTE — Progress Notes (Addendum)
 STROKE TEAM PROGRESS NOTE   SUBJECTIVE (INTERVAL HISTORY)  Pt was seen with daughter at bedside. Per pt and daughter, L facial droop is unchanged, vision is now poorer, speech delay is worse, she tilts her head back more, and her L eye wanders worse than her R eye (both wander). The event occurred on Sunday. Pt has been compliant with Plavix , and takes it after dialysis on dialysis days.    OBJECTIVE Temp:  [98.2 F (36.8 C)-98.9 F (37.2 C)] 98.4 F (36.9 C) (12/09 0253) Pulse Rate:  [81-88] 84 (12/09 1300) Cardiac Rhythm: Normal sinus rhythm (12/08 1858) Resp:  [8-17] 16 (12/09 1300) BP: (159-190)/(65-117) 190/74 (12/09 0824) SpO2:  [100 %] 100 % (12/09 1300) Weight:  [46.3 kg] 46.3 kg (12/09 0200)  Recent Labs  Lab 04/23/24 1330 04/24/24 1240  GLUCAP 173* 303*   Recent Labs  Lab 04/23/24 1344  NA 137  K 3.4*  CL 97*  CO2 29  GLUCOSE 184*  BUN 9  CREATININE 3.51*  CALCIUM  8.0*   Recent Labs  Lab 04/23/24 1344  AST 15  ALT 8  ALKPHOS 48  BILITOT 0.9  PROT 7.2  ALBUMIN 3.3*   Recent Labs  Lab 04/23/24 1344  WBC 3.0*  NEUTROABS 1.6*  HGB 11.8*  HCT 37.8  MCV 96.7  PLT 163   No results for input(s): CKTOTAL, CKMB, CKMBINDEX, TROPONINI in the last 168 hours. Recent Labs    04/23/24 1344  LABPROT 14.3  INR 1.1   No results for input(s): COLORURINE, LABSPEC, PHURINE, GLUCOSEU, HGBUR, BILIRUBINUR, KETONESUR, PROTEINUR, UROBILINOGEN, NITRITE, LEUKOCYTESUR in the last 72 hours.  Invalid input(s): APPERANCEUR     Component Value Date/Time   CHOL 111 04/24/2024 0456   CHOL 133 04/01/2022 1007   TRIG 44 04/24/2024 0456   HDL 72 04/24/2024 0456   HDL 80 04/01/2022 1007   CHOLHDL 1.5 04/24/2024 0456   VLDL 9 04/24/2024 0456   LDLCALC 30 04/24/2024 0456   LDLCALC 41 04/01/2022 1007   Lab Results  Component Value Date   HGBA1C 8.5 (H) 04/24/2024      Component Value Date/Time   LABOPIA NONE DETECTED 01/12/2021 2100    COCAINSCRNUR NONE DETECTED 01/12/2021 2100   LABBENZ NONE DETECTED 01/12/2021 2100   AMPHETMU NONE DETECTED 01/12/2021 2100   THCU NONE DETECTED 01/12/2021 2100   LABBARB NONE DETECTED 01/12/2021 2100    Recent Labs  Lab 04/23/24 1344  ETH <15    I have personally reviewed the radiological images below and agree with the radiology interpretations.  CT ANGIO HEAD NECK W WO CM Result Date: 04/24/2024 CLINICAL DATA:  Stroke/TIA EXAM: CT ANGIOGRAPHY HEAD AND NECK WITH AND WITHOUT CONTRAST TECHNIQUE: Multidetector CT imaging of the head and neck was performed using the standard protocol during bolus administration of intravenous contrast. Multiplanar CT image reconstructions and MIPs were obtained to evaluate the vascular anatomy. Carotid stenosis measurements (when applicable) are obtained utilizing NASCET criteria, using the distal internal carotid diameter as the denominator. RADIATION DOSE REDUCTION: This exam was performed according to the departmental dose-optimization program which includes automated exposure control, adjustment of the mA and/or kV according to patient size and/or use of iterative reconstruction technique. CONTRAST:  75mL OMNIPAQUE  IOHEXOL  350 MG/ML SOLN COMPARISON:  Brain MRI April 23, 2024 FINDINGS: CT HEAD: There is low-attenuation in the cerebral white matter There is no hemorrhage. No acute ischemic changes. No mass lesion. The ventricles are normal. Skull/sinuses/orbits: No significant abnormality. CTA NECK: CTA NECK Aortic arch:  No proximal vessel stenosis. Right carotid: There is a small amount of plaque with less than 50% stenosis Left carotid: Small amount of plaque with less than 50% stenosis Right vertebral: Normal Left vertebral: Normal Soft tissues: No significant abnormality Other comments: None CTA HEAD: CTA HEAD Right anterior circulation: The internal carotid artery is patent without significant stenosis. The anterior and middle cerebral arteries are patent  without significant stenosis or proximal branch occlusion. No aneurysm. Left anterior circulation: The internal carotid artery is patent without significant stenosis. The anterior and middle cerebral arteries are patent without significant stenosis or proximal branch occlusion. No aneurysm. Posterior circulation: Both vertebral arteries are patent intracranially. No basilar stenosis. There is mild stenosis of the P1 segment of the right posterior cerebral artery. IMPRESSION: 1. Bilateral carotid plaque with less than 50% stenosis 2. No significant vertebrobasilar or intracranial disease 3. Chronic white matter abnormalities. No acute abnormality noted on the CT. Electronically Signed   By: Nancyann Burns M.D.   On: 04/24/2024 09:43   MR BRAIN WO CONTRAST Result Date: 04/23/2024 EXAM: MRI BRAIN WITHOUT CONTRAST 04/23/2024 05:42:47 PM TECHNIQUE: Multiplanar multisequence MRI of the head/brain was performed without the administration of intravenous contrast. COMPARISON: Same day CT head and MRI head 09/23/2023. CLINICAL HISTORY: Vision loss, known etiology. FINDINGS: BRAIN AND VENTRICLES: There is a 4 mm focus of acute infarct within the right dorsal aspect of the midbrain partially involving the periaqueductal gray matter seen on series 2 image 22. No significant associated edema or mass effect. Remote lacunar infarcts are present in the bilateral basal ganglia and thalami, left greater than right, and in the right corona radiata. Volume loss in the left cerebral peduncle is likely related to prior infarcts. T2 and FLAIR hyperintensity in the periventricular and subcortical white matter with additional signal abnormality in the pons is suggestive of moderate chronic microvascular ischemic changes. Multiple scattered chronic microhemorrhages are present, particularly within the thalami and brainstem, likely related to hypertensive encephalopathy. Few additional smaller chronic microhemorrhages are seen in the cerebral  hemispheres. There is mild generalized parenchymal volume loss. No mass. No midline shift. No hydrocephalus. The sella is unremarkable. Normal flow voids. ORBITS: Bilateral lens replacement. SINUSES AND MASTOIDS: Mucosal thickening in the left sphenoid sinus. BONES AND SOFT TISSUES: Normal marrow signal. Degenerative changes in the visualized upper cervical spine. Prominent signal abnormality at C4-C5 likely reflecting chronic degenerative endplate changes. No acute soft tissue abnormality. IMPRESSION: 1. Acute infarct in the right dorsal midbrain measuring 4 mm, partially involving the periaqueductal gray matter. No significant edema or mass effect. 2. Multiple scattered chronic microhemorrhages, particularly within the thalami and brainstem, likely related to hypertensive encephalopathy. 3. Moderate chronic microvascular ischemic changes, mild generalized parenchymal volume loss, remote lacunar infarcts as above. Electronically signed by: Donnice Mania MD 04/23/2024 06:01 PM EST RP Workstation: [id number]   CT HEAD WO CONTRAST Result Date: 04/23/2024 EXAM: CT HEAD WITHOUT CONTRAST 04/23/2024 02:19:26 PM TECHNIQUE: CT of the head was performed without the administration of intravenous contrast. Automated exposure control, iterative reconstruction, and/or weight based adjustment of the mA/kV was utilized to reduce the radiation dose to as low as reasonably achievable. COMPARISON: 09/23/2023 CLINICAL HISTORY: Neuro deficit, acute, stroke suspected. FINDINGS: BRAIN AND VENTRICLES: No acute hemorrhage. No evidence of acute infarct. Patchy and confluent decreased attenuation throughout deep and periventricular white matter of cerebral hemispheres bilaterally, compatible with chronic microvascular ischemic disease. Chronic right corona radiata, left basal ganglia lacunar infarcts. No hydrocephalus. No extra-axial collection. No mass effect  or midline shift. ORBITS: Bilateral lens replacement. SINUSES: Left sphenoid  sinus mucosal thickening. SOFT TISSUES AND SKULL: No acute soft tissue abnormality. No skull fracture. VASCULATURE: Atherosclerotic calcifications within cavernous internal carotid arteries. IMPRESSION: 1. No acute intracranial abnormality. 2. Moderate chronic microvascular ischemic disease. Chronic right corona radiata and left basal ganglia lacunar infarcts. Electronically signed by: Donnice Mania MD 04/23/2024 02:41 PM EST RP Workstation: [id number]     PHYSICAL EXAM  Temp:  [98.2 F (36.8 C)-98.9 F (37.2 C)] 98.4 F (36.9 C) (12/09 0253) Pulse Rate:  [81-88] 84 (12/09 1300) Resp:  [8-17] 16 (12/09 1300) BP: (159-190)/(65-117) 190/74 (12/09 0824) SpO2:  [100 %] 100 % (12/09 1300) Weight:  [46.3 kg] 46.3 kg (12/09 0200)  General - Well nourished, well developed, in no apparent distress.  Mental Status -  Level of arousal and orientation to time, place, and person were intact. Language including expression, naming, repetition, comprehension was assessed and found intact. Moderate dysarthria Fund of Knowledge was assessed and was intact.  Cranial Nerves II - XII - II - Substantial tunnel vision, with diminished peripheral vision in all quadrants. III, IV, VI - Extraocular movements impaired, eye disconjugate with R eye inward deviation, neither eye has superior motion, inferior motion is greatly limited. Both eyes demonstrate mild opsoclonus. Double vision with looking at far.  V - Facial sensation intact bilaterally. VII - L NLF mild flattening at rest, R facial mild weakness on movement VIII - Hearing & vestibular intact bilaterally. X - Palate elevates symmetrically. XI - Chin turning & shoulder shrug intact bilaterally. XII - Tongue protrusion midline  Motor Strength - The patient's strength was symmetrical in all extremities and pronator drift was absent.  Bulk was normal and fasciculations were absent.    Sensory - Light touch were assessed and were symmetrical.     Coordination - The patient had normal movements in the hands with no ataxia or dysmetria.  Tremor was absent.  Gait and Station - deferred.   ASSESSMENT/PLAN Ms. Braley Luckenbaugh is a 58 y.o. female with history of CAD s/p PCI, CVA, ESRD on dialysis, HTN, and HLD admitted for visual field changes and aphasia.   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   MRI: Acute R dorsal midbrain infarct 4mm in size.  CTA: bilateral Carotid plaque <50% stenosis 2D Echo EF 65-70% LDL: 30 HgbA1c: 8.5 Heparin  subq for VTE prophylaxis clopidogrel  75 mg daily prior to admission, now on clopidogrel  75 mg daily. P2Y12 level pending this afternoon for evaluation of Plavix  effectiveness. Recommended 30 days of Aspirin  and Brillinta After above 30 days, Resume Palvix if P2Y12 levels are appropriately low, if P2Y12 levels are normal, then Aspirin  81mg  daily indefinitely. Ongoing aggressive stroke risk factor management Therapy recommendations:  CIR Disposition:  pending  History of stroke 2011 had slurred speech and right-sided weakness 12/2016 details not clear 06/2017 cerebellar infarcts, CT head and neck negative. 02/2018 A1c 13.0 LDL 47, put on Plavix  05/2020 right pontine infarct on MRI.  MRI and carotid Doppler negative.  LDL 56 and A1c 8.9.  Discharged on DAPT and Crestor  20. 12/2020 ER visit for altered mental status speech difficulty.  MRI negative for stroke.  LDL 129, A1c 8.4.  2D echo unremarkable. 04/2022 worsening right-sided weakness.  CT, CTA, MRI negative for stroke.  Concerning for recrudescence 09/2023 admitted for aphasia with slow responses.  Found to have a hemoglobin 5.7.  MRI negative.  Suspected recrudescence from severe anemia. Follow with Dr.  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 Penne Mori, DO, PGY1 Neurology Stroke Team  04/24/2024 1:47 PM  ATTENDING NOTE: I reviewed above note and agree with the assessment and plan. Pt was seen and examined.   Daughter at bedside.  Patient lying bed, eyes open, awake alert and orientated but with moderate dysarthria. No aphasia but paucity of speech, following all simple commands. Able to name and repeat in dysarthric voice. No gaze palsy, tracking bilaterally, however with right eye inward deviation, limited peripheral vision with only tunnel vision.  Complaining of diplopia on looking far. Opsoclonus seem to have left facial droop at rest and by the patient to movement. Tongue midline.  Bilateral upper and lower extremities 3/5 symmetrical. Sensation symmetrical bilaterally, b/l FTN intact grossly, gait not tested.   For detailed assessment and plan, please refer to above as I have made changes wherever appropriate.   Ary Cummins, MD PhD Stroke Neurology 04/24/2024 5:18 PM    To contact Stroke Continuity provider, please refer to [email or web address]. After hours, contact General Neurology

## 2024-04-24 NOTE — Progress Notes (Signed)
  Echocardiogram 2D Echocardiogram has been performed.  Hannah [name] 04/24/2024, 2:27 PM

## 2024-04-24 NOTE — Evaluation (Signed)
 Physical Therapy Evaluation Patient Details Name: Hannah [name] MRN: [id number] DOB: [birth year] Today's Date: 04/24/2024  History of Present Illness  58 y.o. female presents to [facility] hospital on 04/23/2024 with visual changes and aphasia. MRI demonstrates R dorsal midbrain CVA. PMH includes CAD, CVA with residual R weakness, ESRD, HTN, HLD., MI, MS.  Clinical Impression  Pt presents to PT with deficits in functional mobility, strength, power, balance, gait, endurance, vision. Pt has new deficits in vision, lacking peripheral vision in all directions and with notable gaze instability throughout session. Pt requires increased assistance to perform bed mobility, transfers, and to ambulate compared to her baseline. Pt and pt's daughter are hopeful she can improve her balance and ability to transfer and ambulate for short distances to reduce her falls risk and to reduce caregiver burden. Patient will benefit from intensive inpatient follow-up therapy, >3 hours/day.        If plan is discharge home, recommend the following: A lot of help with walking and/or transfers;A lot of help with bathing/dressing/bathroom;Assistance with cooking/housework;Assist for transportation;Help with stairs or ramp for entrance   Can travel by private vehicle        Equipment Recommendations None recommended by PT  Recommendations for Other Services  Rehab consult    Functional Status Assessment Patient has had a recent decline in their functional status and demonstrates the ability to make significant improvements in function in a reasonable and predictable amount of time.     Precautions / Restrictions Precautions Precautions: Fall Recall of Precautions/Restrictions: Intact Precaution/Restrictions Comments: residual R hemiparesis, gaze instability, central vision only Restrictions Weight Bearing Restrictions Per Provider Order: No      Mobility  Bed Mobility Overal bed mobility: Needs Assistance Bed  Mobility: Rolling, Sidelying to Sit, Sit to Sidelying Rolling: Min assist Sidelying to sit: Mod assist, HOB elevated     Sit to sidelying: Mod assist      Transfers Overall transfer level: Needs assistance Equipment used: 1 person hand held assist, Rolling walker (2 wheels) Transfers: Sit to/from Stand, Bed to chair/wheelchair/BSC Sit to Stand: Min assist, From elevated surface Stand pivot transfers: Mod assist         General transfer comment: assist via gait belt from PT, pt requries facilitation of pivot at hips by PT during stand pivot transfer to/from wheelchair.    Ambulation/Gait Ambulation/Gait assistance: Mod assist Gait Distance (Feet): 2 Feet Assistive device: Rolling walker (2 wheels) Gait Pattern/deviations: Step-to pattern, Decreased step length - right, Decreased step length - left, Decreased stance time - right, Decreased stance time - left, Decreased dorsiflexion - right, Decreased dorsiflexion - left Gait velocity: reduced Gait velocity interpretation: <1.31 ft/sec, indicative of household ambulator   General Gait Details: pt with slowed step-to gait, reduced foot clearance bilaterally and increased time in dual support stance phase. Posterior lean prevalent which PT provides modA to correct constantly throughout gait trial. Distance limited due to MD arrival  Stairs            Wheelchair Mobility     Tilt Bed    Modified Rankin (Stroke Patients Only) Modified Rankin (Stroke Patients Only) Pre-Morbid Rankin Score: Moderately severe disability Modified Rankin: Moderately severe disability     Balance Overall balance assessment: Needs assistance Sitting-balance support: No upper extremity supported, Feet supported Sitting balance-Leahy Scale: Poor Sitting balance - Comments: minA Postural control: Posterior lean, Left lateral lean Standing balance support: Bilateral upper extremity supported, Reliant on assistive device for balance Standing  balance-Leahy Scale: Poor Standing balance  comment: min-modA, posterior lean                             Pertinent Vitals/Pain Pain Assessment Pain Assessment: No/denies pain    Home Living Family/patient expects to be discharged to:: Private residence Living Arrangements: Children Available Help at Discharge: Family;Available 24 hours/day Type of Home: Apartment Home Access: Stairs to enter Entrance Stairs-Rails: None Entrance Stairs-Number of Steps: threshold   Home Layout: One level Home Equipment: Tub bench;Rolling Walker (2 wheels);BSC/3in1;Wheelchair - manual;Cane - single point      Prior Function Prior Level of Function : Needs assist             Mobility Comments: son or daughter assist the pt in stand pivot transfers to her manual wheelchair. Pt's MWC does not fit in the bathroom so she walks with min assistance for ~5-10' to reach the commode or tub ADLs Comments: family assisting with bathing and dressing but daughter feels like pt has potential to be able to do more. family assists with IADLs     Extremity/Trunk Assessment   Upper Extremity Assessment Upper Extremity Assessment: RUE deficits/detail;LUE deficits/detail RUE Deficits / Details: pt lacks ~15 degrees R elbow extension passively, active shoulder flexion available to ~100 degrees, strenght grossly 3+/5 through available range LUE Deficits / Details: ROM WFL, strength grossly 4-/5    Lower Extremity Assessment Lower Extremity Assessment: RLE deficits/detail;LLE deficits/detail RLE Deficits / Details: ROM WFL, strength grossly 3+/5 LLE Deficits / Details: ROM WFL, strength grossly 4-/5    Cervical / Trunk Assessment Cervical / Trunk Assessment: Normal  Communication   Communication Communication: Impaired Factors Affecting Communication: Reduced clarity of speech    Cognition Arousal: Alert Behavior During Therapy: WFL for tasks assessed/performed   PT - Cognitive impairments:  Memory, Problem solving                       PT - Cognition Comments: anticipate pt may be close to baseline Following commands: Intact       Cueing Cueing Techniques: Verbal cues     General Comments General comments (skin integrity, edema, etc.): VSS on RA, pt with dysconjugate gaze, lacks peripheral vision in all directions    Exercises     Assessment/Plan    PT Assessment Patient needs continued PT services  PT Problem List Decreased strength;Decreased activity tolerance;Decreased balance;Decreased mobility;Decreased knowledge of use of DME;Decreased safety awareness;Decreased knowledge of precautions       PT Treatment Interventions DME instruction;Gait training;Functional mobility training;Therapeutic activities;Therapeutic exercise;Balance training;Neuromuscular re-education;Cognitive remediation;Wheelchair mobility training;Patient/family education    PT Goals (Current goals can be found in the Care Plan section)  Acute Rehab PT Goals Patient Stated Goal: to reduce caregiver burden in transfers and when ambulating into bathroom PT Goal Formulation: With patient Time For Goal Achievement: 05/08/24 Potential to Achieve Goals: Fair    Frequency Min 2X/week     Co-evaluation               AM-PAC PT 6 Clicks Mobility  Outcome Measure Help needed turning from your back to your side while in a flat bed without using bedrails?: A Little Help needed moving from lying on your back to sitting on the side of a flat bed without using bedrails?: A Lot Help needed moving to and from a bed to a chair (including a wheelchair)?: A Lot Help needed standing up from a chair using your arms (e.g.,  wheelchair or bedside chair)?: A Little Help needed to walk in hospital room?: Total Help needed climbing 3-5 steps with a railing? : Total 6 Click Score: 12    End of Session Equipment Utilized During Treatment: Gait belt Activity Tolerance: Patient tolerated treatment  well Patient left: in bed;with call bell/phone within reach;with bed alarm set;with family/visitor present Nurse Communication: Mobility status PT Visit Diagnosis: Other abnormalities of gait and mobility (R26.89);Other symptoms and signs involving the nervous system (R29.898);Muscle weakness (generalized) (M62.81)    Time: 9098-9047 PT Time Calculation (min) (ACUTE ONLY): 51 min   Charges:   PT Evaluation $PT Eval Low Complexity: 1 Low PT Treatments $Therapeutic Activity: 8-22 mins PT General Charges $$ ACUTE PT VISIT: 1 Visit         Bernardino JINNY Ruth, PT, DPT Acute Rehabilitation Office [phone]   Bernardino JINNY Ruth 04/24/2024, 1:55 PM

## 2024-04-24 NOTE — Progress Notes (Signed)
 PROGRESS NOTE    Hannah [name]  FMW:[id number] DOB: [birth year] DOA: 04/23/2024 PCP: Delayne Artist PARAS, MD    Brief Narrative: This 58 yrs old female with PMH significant for CAD status post PCI, Prior CVA, ESRD on hemodialysis, hypertension, hyperlipidemia, who presented to the ED due to visual field changes and aphasia.  Patient last known well was 04/22/2024. She had a prior stroke and has residual right sided hemiparesis.  Daughter has noticed left-sided facial droop and decreased strength on the left side so she called the EMS.  Patient was found to be hemodynamically stable in the ED, Labs creatinine 3.5, potassium 3.4, CT head no acute strokes,  MRI brain demonstrated acute infarct in the right dorsal midbrain.  Neurology was consulted and patient was admitted for a stroke workup.  Assessment & Plan:   Principal Problem:   CVA (cerebral vascular accident) (HCC) Active Problems:   Acute hypoxic respiratory failure (HCC)   Acute CVA (cerebrovascular accident) (HCC)  Acute midbrain infarct: Patient presented with left-sided facial droop with decreased strength on the left side.   CT head no acute stroke noted MRI brain demonstrated acute infarct in the right dorsal midbrain. She is out of tPA window. Neurology consult appreciated.  Recommended complete stroke workup. Obtain 2D echocardiogram. Obtain MRA head and neck. Obtain carotid duplex. LDL under 30 under goal, hemoglobin A1c 8.2. Start aspirin  and Plavix  for 21 days followed by monotherapy. Continue Crestor . PT and OT evaluation. N.p.o. until speech and swallow evaluation completed.  Hyperlipidemia: Continue rosuvastatin .  LDL 30.  Hypertension: Continue hydralazine ,  Imdur , and Coreg .  CAD status post PCI: Continue Plavix  Crestor  and carvedilol .  ESRD on hemodialysis: Patient has no urgent acute indication for dialysis. Nephrology consulted for continuation of hemodialysis.  Chorioretinitis Continue  azithroprine  Diabetes mellitus type 2 Uncontrolled, hemoglobin A1c 8.5. Continue Semglee  10 units daily Continue regular insulin  sliding scale.  DVT prophylaxis: Heparin  subcu Code Status: Full code Family Communication: Daughter at bedside. Disposition Plan:    Status is: Inpatient Remains inpatient appropriate because: Admitted for stroke workup.    Consultants:  Neurology  Procedures:CT head, MRI   Antimicrobials:  Anti-infectives (From admission, onward)    None       Subjective: Patient was seen and examined at bedside.  Overnight events noted. Patient is alert and oriented,  following commands.  She reports she is feeling better,  daughter is at bedside and states she is improving.  Objective: Vitals:   04/24/24 0400 04/24/24 0500 04/24/24 0715 04/24/24 0824  BP: (!) 181/73 (!) 160/71 (!) 168/72 (!) 190/74  Pulse: 87 85 88   Resp: 16  13   Temp:      TempSrc:      SpO2: 100% 100% 100%   Weight:       No intake or output data in the 24 hours ending 04/24/24 1139 Filed Weights   04/24/24 0200  Weight: 46.3 kg    Examination:  General exam: Appears calm and comfortable, deconditioned, not in any acute distress. Respiratory system: CTA Bilaterally. Respiratory effort normal.  RR 14 Cardiovascular system: S1 & S2 heard, RRR. No JVD, murmurs, rubs, gallops or clicks.  Gastrointestinal system: Abdomen is non distended, soft and non tender. Normal bowel sounds heard. Central nervous system: Alert and oriented x 3.  Right-sided weakness from prior stroke. Extremities: No edema, no cyanosis, no clubbing. Skin: No rashes, lesions or ulcers Psychiatry: Judgement and insight appear normal. Mood & affect appropriate.  Data Reviewed: I have personally reviewed following labs and imaging studies  CBC: Recent Labs  Lab 04/23/24 1344  WBC 3.0*  NEUTROABS 1.6*  HGB 11.8*  HCT 37.8  MCV 96.7  PLT 163   Basic Metabolic Panel: Recent Labs  Lab  04/23/24 1344  NA 137  K 3.4*  CL 97*  CO2 29  GLUCOSE 184*  BUN 9  CREATININE 3.51*  CALCIUM  8.0*   GFR: Estimated Creatinine Clearance: 12.5 mL/min (A) (by C-G formula based on SCr of 3.51 mg/dL (H)). Liver Function Tests: Recent Labs  Lab 04/23/24 1344  AST 15  ALT 8  ALKPHOS 48  BILITOT 0.9  PROT 7.2  ALBUMIN 3.3*   No results for input(s): LIPASE, AMYLASE in the last 168 hours. No results for input(s): AMMONIA in the last 168 hours. Coagulation Profile: Recent Labs  Lab 04/23/24 1344  INR 1.1   Cardiac Enzymes: No results for input(s): CKTOTAL, CKMB, CKMBINDEX, TROPONINI in the last 168 hours. BNP (last 3 results) No results for input(s): PROBNP in the last 8760 hours. HbA1C: Recent Labs    04/24/24 0456  HGBA1C 8.5*   CBG: Recent Labs  Lab 04/23/24 1330  GLUCAP 173*   Lipid Profile: Recent Labs    04/24/24 0456  CHOL 111  HDL 72  LDLCALC 30  TRIG 44  CHOLHDL 1.5   Thyroid  Function Tests: No results for input(s): TSH, T4TOTAL, FREET4, T3FREE, THYROIDAB in the last 72 hours. Anemia Panel: No results for input(s): VITAMINB12, FOLATE, FERRITIN, TIBC, IRON, RETICCTPCT in the last 72 hours. Sepsis Labs: No results for input(s): PROCALCITON, LATICACIDVEN in the last 168 hours.  No results found for this or any previous visit (from the past 240 hours).   Radiology Studies: CT ANGIO HEAD NECK W WO CM Result Date: 04/24/2024 CLINICAL DATA:  Stroke/TIA EXAM: CT ANGIOGRAPHY HEAD AND NECK WITH AND WITHOUT CONTRAST TECHNIQUE: Multidetector CT imaging of the head and neck was performed using the standard protocol during bolus administration of intravenous contrast. Multiplanar CT image reconstructions and MIPs were obtained to evaluate the vascular anatomy. Carotid stenosis measurements (when applicable) are obtained utilizing NASCET criteria, using the distal internal carotid diameter as the denominator. RADIATION  DOSE REDUCTION: This exam was performed according to the departmental dose-optimization program which includes automated exposure control, adjustment of the mA and/or kV according to patient size and/or use of iterative reconstruction technique. CONTRAST:  75mL OMNIPAQUE  IOHEXOL  350 MG/ML SOLN COMPARISON:  Brain MRI April 23, 2024 FINDINGS: CT HEAD: There is low-attenuation in the cerebral white matter There is no hemorrhage. No acute ischemic changes. No mass lesion. The ventricles are normal. Skull/sinuses/orbits: No significant abnormality. CTA NECK: CTA NECK Aortic arch: No proximal vessel stenosis. Right carotid: There is a small amount of plaque with less than 50% stenosis Left carotid: Small amount of plaque with less than 50% stenosis Right vertebral: Normal Left vertebral: Normal Soft tissues: No significant abnormality Other comments: None CTA HEAD: CTA HEAD Right anterior circulation: The internal carotid artery is patent without significant stenosis. The anterior and middle cerebral arteries are patent without significant stenosis or proximal branch occlusion. No aneurysm. Left anterior circulation: The internal carotid artery is patent without significant stenosis. The anterior and middle cerebral arteries are patent without significant stenosis or proximal branch occlusion. No aneurysm. Posterior circulation: Both vertebral arteries are patent intracranially. No basilar stenosis. There is mild stenosis of the P1 segment of the right posterior cerebral artery. IMPRESSION: 1. Bilateral carotid plaque with  less than 50% stenosis 2. No significant vertebrobasilar or intracranial disease 3. Chronic white matter abnormalities. No acute abnormality noted on the CT. Electronically Signed   By: Nancyann Burns M.D.   On: 04/24/2024 09:43   MR BRAIN WO CONTRAST Result Date: 04/23/2024 EXAM: MRI BRAIN WITHOUT CONTRAST 04/23/2024 05:42:47 PM TECHNIQUE: Multiplanar multisequence MRI of the head/brain was  performed without the administration of intravenous contrast. COMPARISON: Same day CT head and MRI head 09/23/2023. CLINICAL HISTORY: Vision loss, known etiology. FINDINGS: BRAIN AND VENTRICLES: There is a 4 mm focus of acute infarct within the right dorsal aspect of the midbrain partially involving the periaqueductal gray matter seen on series 2 image 22. No significant associated edema or mass effect. Remote lacunar infarcts are present in the bilateral basal ganglia and thalami, left greater than right, and in the right corona radiata. Volume loss in the left cerebral peduncle is likely related to prior infarcts. T2 and FLAIR hyperintensity in the periventricular and subcortical white matter with additional signal abnormality in the pons is suggestive of moderate chronic microvascular ischemic changes. Multiple scattered chronic microhemorrhages are present, particularly within the thalami and brainstem, likely related to hypertensive encephalopathy. Few additional smaller chronic microhemorrhages are seen in the cerebral hemispheres. There is mild generalized parenchymal volume loss. No mass. No midline shift. No hydrocephalus. The sella is unremarkable. Normal flow voids. ORBITS: Bilateral lens replacement. SINUSES AND MASTOIDS: Mucosal thickening in the left sphenoid sinus. BONES AND SOFT TISSUES: Normal marrow signal. Degenerative changes in the visualized upper cervical spine. Prominent signal abnormality at C4-C5 likely reflecting chronic degenerative endplate changes. No acute soft tissue abnormality. IMPRESSION: 1. Acute infarct in the right dorsal midbrain measuring 4 mm, partially involving the periaqueductal gray matter. No significant edema or mass effect. 2. Multiple scattered chronic microhemorrhages, particularly within the thalami and brainstem, likely related to hypertensive encephalopathy. 3. Moderate chronic microvascular ischemic changes, mild generalized parenchymal volume loss, remote  lacunar infarcts as above. Electronically signed by: Donnice Mania MD 04/23/2024 06:01 PM EST RP Workstation: [id number]   CT HEAD WO CONTRAST Result Date: 04/23/2024 EXAM: CT HEAD WITHOUT CONTRAST 04/23/2024 02:19:26 PM TECHNIQUE: CT of the head was performed without the administration of intravenous contrast. Automated exposure control, iterative reconstruction, and/or weight based adjustment of the mA/kV was utilized to reduce the radiation dose to as low as reasonably achievable. COMPARISON: 09/23/2023 CLINICAL HISTORY: Neuro deficit, acute, stroke suspected. FINDINGS: BRAIN AND VENTRICLES: No acute hemorrhage. No evidence of acute infarct. Patchy and confluent decreased attenuation throughout deep and periventricular white matter of cerebral hemispheres bilaterally, compatible with chronic microvascular ischemic disease. Chronic right corona radiata, left basal ganglia lacunar infarcts. No hydrocephalus. No extra-axial collection. No mass effect or midline shift. ORBITS: Bilateral lens replacement. SINUSES: Left sphenoid sinus mucosal thickening. SOFT TISSUES AND SKULL: No acute soft tissue abnormality. No skull fracture. VASCULATURE: Atherosclerotic calcifications within cavernous internal carotid arteries. IMPRESSION: 1. No acute intracranial abnormality. 2. Moderate chronic microvascular ischemic disease. Chronic right corona radiata and left basal ganglia lacunar infarcts. Electronically signed by: Donnice Mania MD 04/23/2024 02:41 PM EST RP Workstation: [id number]   Scheduled Meds:  [START ON 04/25/2024]  stroke: early stages of recovery book   Does not apply Once   azaTHIOprine   50 mg Oral Daily   carvedilol   25 mg Oral BID WC   clopidogrel   75 mg Oral Daily   heparin  injection (subcutaneous)  5,000 Units Subcutaneous Q8H   hydrALAZINE   25 mg Oral BID   isosorbide  mononitrate  30 mg Oral QHS   rosuvastatin   20 mg Oral Daily   Continuous Infusions:   LOS: 1 day    Time spent: 50  Mins    Darcel Dawley, MD [facility]   If 7PM-7AM, please contact night-coverage

## 2024-04-24 NOTE — Progress Notes (Addendum)
 Pt new admit for acute stroke. BP goal SBP<180, last BP reading 185/66. Pt asymptomatic, other VS WDL. Alfornia, MD notified.  No new orders at this time. Imdure scheduled for 2200 given. RN instructed by Alfornia, MD to recheck BP. BP 159/69, Alfornia, MD updated.

## 2024-04-24 NOTE — Progress Notes (Signed)

## 2024-04-25 ENCOUNTER — Telehealth (HOSPITAL_COMMUNITY): Payer: Self-pay

## 2024-04-25 ENCOUNTER — Other Ambulatory Visit (HOSPITAL_COMMUNITY): Payer: Self-pay

## 2024-04-25 DIAGNOSIS — E1122 Type 2 diabetes mellitus with diabetic chronic kidney disease: Secondary | ICD-10-CM | POA: Diagnosis not present

## 2024-04-25 DIAGNOSIS — E1151 Type 2 diabetes mellitus with diabetic peripheral angiopathy without gangrene: Secondary | ICD-10-CM | POA: Diagnosis not present

## 2024-04-25 DIAGNOSIS — I6381 Other cerebral infarction due to occlusion or stenosis of small artery: Secondary | ICD-10-CM | POA: Diagnosis not present

## 2024-04-25 DIAGNOSIS — I6302 Cerebral infarction due to thrombosis of basilar artery: Secondary | ICD-10-CM

## 2024-04-25 DIAGNOSIS — I120 Hypertensive chronic kidney disease with stage 5 chronic kidney disease or end stage renal disease: Secondary | ICD-10-CM | POA: Diagnosis not present

## 2024-04-25 LAB — GLUCOSE, CAPILLARY
Glucose-Capillary: 95 mg/dL (ref 70–99)
Glucose-Capillary: 250 mg/dL — ABNORMAL HIGH (ref 70–99)
Glucose-Capillary: 258 mg/dL — ABNORMAL HIGH (ref 70–99)
Glucose-Capillary: 174 mg/dL — ABNORMAL HIGH (ref 70–99)

## 2024-04-25 LAB — HEPATITIS B SURFACE ANTIGEN: Hepatitis B Surface Ag: NONREACTIVE

## 2024-04-25 MED ORDER — TICAGRELOR 90 MG PO TABS: 90.0000 mg | ORAL_TABLET | Freq: Two times a day (BID) | ORAL | Status: DC

## 2024-04-25 MED ORDER — AMLODIPINE BESYLATE 5 MG PO TABS: 10.0000 mg | ORAL_TABLET | Freq: Every day | ORAL | Status: DC

## 2024-04-25 MED ORDER — AMLODIPINE BESYLATE 5 MG PO TABS
10.0000 mg | ORAL_TABLET | Freq: Every day | ORAL | Status: DC
  Administered 2024-04-26 – 2024-04-30 (×5): 10 mg via ORAL
  Filled 2024-04-25 (×5): qty 2

## 2024-04-25 MED ORDER — TICAGRELOR 90 MG PO TABS
90.0000 mg | ORAL_TABLET | Freq: Two times a day (BID) | ORAL | Status: DC
  Administered 2024-04-25 – 2024-04-30 (×10): 90 mg via ORAL
  Filled 2024-04-25 (×10): qty 1

## 2024-04-25 MED ORDER — CALCIUM ACETATE (PHOS BINDER) 667 MG PO CAPS
667.0000 mg | ORAL_CAPSULE | Freq: Three times a day (TID) | ORAL | Status: DC
  Administered 2024-04-25 – 2024-04-30 (×14): 667 mg via ORAL
  Filled 2024-04-25 (×14): qty 1

## 2024-04-25 MED ORDER — ORAL CARE MOUTH RINSE: 15.0000 mL | OROMUCOSAL | Status: DC | PRN

## 2024-04-25 NOTE — Progress Notes (Addendum)
 Physical Therapy Treatment Patient Details Name: Hannah [name] MRN: [id number] DOB: [birth year] Today's Date: 04/25/2024   History of Present Illness 58 y.o. female presents to [facility] hospital on 04/23/2024 with visual changes and aphasia. MRI demonstrates R dorsal midbrain CVA. PMH includes CAD, CVA with residual R weakness, ESRD, HTN, HLD., MI, MS.    PT Comments  Pt received in supine and agreeable to PT session. Pt reported having pain in abdomen with plan for enema in the afternoon. Despite this, pt was eager to mobilize with good participation during therapy session. Once seated on EOB, pt had a slight left lateral lean able to be corrected with cueing. Worked on lateral elbow props and on scooting forwards to work on bed mobility. Pt was able to perform x2 stands with ModA to boost-up and provide steadying assistance. When standing, weight-shifting, and taking steps pt would have a posterior lean. Slightly able to correct with verbal cueing before returning to posterior lean. Also tendency for forward flexed position with cueing throughout for upright posture. Unable to progress gait due to not having +2 available, however, pt was able to step forwards/backwards and sideways with ModA and RW. Continue to recommend >3hrs post acute rehab with acute PT to follow.     If plan is discharge home, recommend the following: A lot of help with walking and/or transfers;A lot of help with bathing/dressing/bathroom;Assistance with cooking/housework;Assist for transportation;Help with stairs or ramp for entrance   Can travel by private vehicle      No  Equipment Recommendations  None recommended by PT       Precautions / Restrictions Precautions Precautions: Fall Recall of Precautions/Restrictions: Intact Precaution/Restrictions Comments: residual R hemiparesis Restrictions Weight Bearing Restrictions Per Provider Order: No     Mobility  Bed Mobility Overal bed mobility: Needs Assistance Bed  Mobility: Rolling, Sidelying to Sit, Sit to Sidelying Rolling: Min assist, Used rails Sidelying to sit: Mod assist, HOB elevated, Used rails    Sit to sidelying: Mod assist, Used rails, HOB elevated General bed mobility comments: cues for log roll technique with pt able to reach for rail with L hand while protecting R UE. Assist to hook L ankle underneath R LE with slight assist to bring off EOB and to raise trunk. ModA for return to sidelying for LE management    Transfers Overall transfer level: Needs assistance Equipment used: Rolling walker (2 wheels) Transfers: Sit to/from Stand Sit to Stand: Mod assist    General transfer comment: cues for hand placement with ModA to boost-up and steady. Posterior bias with frequent cueing to lean forwards. Cues for upright posture as pt tended to move into forward flexed position.    Ambulation/Gait Ambulation/Gait assistance: Mod assist Gait Distance (Feet): 2 Feet Assistive device: Rolling walker (2 wheels) Gait Pattern/deviations: Step-to pattern, Decreased step length - right, Decreased step length - left, Decreased stance time - right, Decreased stance time - left, Decreased dorsiflexion - right, Decreased dorsiflexion - left Gait velocity: decr     General Gait Details: able to sideways step towards [facility] with ModA for RW management and to correct posterior lean. Increased time and effort     Modified Rankin (Stroke Patients Only) Modified Rankin (Stroke Patients Only) Pre-Morbid Rankin Score: Moderately severe disability Modified Rankin: Moderately severe disability     Balance Overall balance assessment: Needs assistance Sitting-balance support: No upper extremity supported, Feet supported Sitting balance-Leahy Scale: Poor Sitting balance - Comments: CGA/MinA for left lateral lean Postural control: Posterior lean, Left lateral  lean Standing balance support: Bilateral upper extremity supported, Reliant on assistive device for  balance Standing balance-Leahy Scale: Poor Standing balance comment: heavily reliant on UE and external support. Posterior lean throughout with minimal correction with cueing     Communication Communication Communication: Impaired Factors Affecting Communication: Reduced clarity of speech  Cognition Arousal: Alert Behavior During Therapy: WFL for tasks assessed/performed   PT - Cognitive impairments: Memory, Problem solving      Following commands: Intact      Cueing Cueing Techniques: Verbal cues, Gestural cues, Tactile cues  Exercises General Exercises - Lower Extremity Long Arc Quad: AROM, AAROM, Both, 10 reps, Supine Other Exercises Other Exercises: bilateral weight-shifts x10, ModA with RW Other Exercises: bilatearl forward/backwards stepping 2x5, ModA with RW Other Exercises: seated lateral leans onto elbows with push into midline, bilateral x5, CGA/MinA Other Exercises: 2x5 bridges in supine with assist to stabilize LLE    General Comments General comments (skin integrity, edema, etc.): Supportive daughter & son present      Pertinent Vitals/Pain Pain Assessment Pain Assessment: Faces Faces Pain Scale: Hurts little more Pain Location: abdomen Pain Descriptors / Indicators: Aching, Discomfort Pain Intervention(s): Limited activity within patient's tolerance, Monitored during session, Repositioned    Home Living Family/patient expects to be discharged to:: Private residence Living Arrangements: Children Available Help at Discharge: Family;Available 24 hours/day Type of Home: Apartment Home Access: Stairs to enter Entrance Stairs-Rails: None Entrance Stairs-Number of Steps: threshold   Home Layout: One level Home Equipment: Tub bench;Rolling Environmental Consultant (2 wheels);BSC/3in1;Wheelchair - manual;Cane - single point          PT Goals (current goals can now be found in the care plan section) Acute Rehab PT Goals PT Goal Formulation: With patient Time For Goal  Achievement: 05/08/24 Potential to Achieve Goals: Fair Progress towards PT goals: Progressing toward goals    Frequency    Min 2X/week       AM-PAC PT 6 Clicks Mobility   Outcome Measure  Help needed turning from your back to your side while in a flat bed without using bedrails?: A Little Help needed moving from lying on your back to sitting on the side of a flat bed without using bedrails?: A Lot Help needed moving to and from a bed to a chair (including a wheelchair)?: A Lot Help needed standing up from a chair using your arms (e.g., wheelchair or bedside chair)?: A Lot Help needed to walk in hospital room?: Total Help needed climbing 3-5 steps with a railing? : Total 6 Click Score: 11    End of Session Equipment Utilized During Treatment: Gait belt Activity Tolerance: Patient tolerated treatment well Patient left: in bed;with call bell/phone within reach;with bed alarm set Nurse Communication: Mobility status PT Visit Diagnosis: Other abnormalities of gait and mobility (R26.89);Other symptoms and signs involving the nervous system (R29.898);Muscle weakness (generalized) (M62.81)     Time: 8551-8473 PT Time Calculation (min) (ACUTE ONLY): 38 min  Charges:    $Therapeutic Exercise: 23-37 mins $Therapeutic Activity: 8-22 mins PT General Charges $$ ACUTE PT VISIT: 1 Visit                    Kate ORN, PT, DPT Secure Chat Preferred  Rehab Office [phone]   Kate BRAVO Wendolyn 04/25/2024, 4:02 PM

## 2024-04-25 NOTE — TOC Initial Note (Signed)
 Transition of Care [facility]) - Initial/Assessment Note    Patient Details  Name: Hannah [name] MRN: [id number] Date of Birth: August 06, 1965  Transition of Care [facility]) CM/SW Contact:    Andrez JULIANNA George, RN Phone Number: 04/25/2024, 1:06 PM  Clinical Narrative:                  Hannah [name] is a 58 y.o. female with medical history significant of CAD status post PCI, CVA, ESRD on hemodialysis, hypertension, hyperlipidemia, prior stroke who presented to the emergency [facility] due to visual field changes and aphasia.   Current recommendations are for CIR. CIR to begin insurance auth.  IP Care management following.  Expected Discharge Plan: IP Rehab Facility     Patient Goals and CMS Choice            Expected Discharge Plan and Services                                              Prior Living Arrangements/Services                       Activities of Daily Living      Permission Sought/Granted                  Emotional Assessment              Admission diagnosis:  Hypertensive encephalopathy [I67.4] Visual disturbance [H53.9] CVA (cerebral vascular accident) (HCC) [I63.9] Acute CVA (cerebrovascular accident) (HCC) [I63.9] Cerebrovascular accident (CVA) due to occlusion of precerebral artery (HCC) [I63.20] Acute hypoxic respiratory failure (HCC) [J96.01] Patient Active Problem List   Diagnosis Date Noted   Acute hypoxic respiratory failure (HCC) 04/24/2024   Acute CVA (cerebrovascular accident) (HCC) 04/24/2024   CVA (cerebral vascular accident) (HCC) 04/23/2024   Symptomatic anemia 09/26/2023   Anemia 09/23/2023   Chorioretinitis 09/23/2023   Dependent edema 06/09/2022   Long term (current) use of insulin  (HCC) 01/14/2022   Chronic constipation 06/09/2021   Altered mental status, unspecified 01/12/2021   Other chronic postprocedural pain 01/09/2021   Spastic hemiplegia due to cerebrovascular disease (HCC) 11/03/2020   Debility  10/18/2020   ESRD on hemodialysis (HCC)    SOB (shortness of breath) 09/27/2020   CKD (chronic kidney disease) stage 5, GFR less than 15 ml/min (HCC) 09/13/2020   H/O: CVA (cerebrovascular accident) 09/12/2020   Chronic kidney disease (CKD), stage IV (severe) (HCC)    Type 2 diabetes mellitus (HCC)    Essential hypertension    Right pontine cerebrovascular accident (HCC) 06/12/2020   Stroke-like symptoms 06/09/2020   Acute kidney injury superimposed on CKD 06/09/2020   Cerebral thrombosis with cerebral infarction 06/09/2020   Hyperlipidemia associated with type 2 diabetes mellitus (HCC)    Bimalleolar ankle fracture 11/24/2018   Lower abdominal pain 07/28/2017   Bladder mass 07/18/2017   Painless hematuria 07/18/2017   Intractable vomiting with nausea 07/12/2017   Hemiparesis affecting right side as late effect of cerebrovascular accident (CVA) (HCC) 09/22/2016   AKI (acute kidney injury) 01/08/2016   Multiple sclerosis 2009   CAD S/P PCI x 2 2009   History of myocardial infarction due to atherothrombotic coronary artery disease 2009   PCP:  Delayne Artist PARAS, MD Pharmacy:   [facility] [address] [place] - [address] [place] [address]  Phone: [phone] Fax: [phone]  [facility] [address] [place] [address] Phone: [phone] Fax: [phone]     Social Drivers of Health (SDOH) Social History: SDOH Screenings   Food Insecurity: No Food Insecurity (04/25/2024)  Housing: Low Risk  (04/25/2024)  Transportation Needs: No Transportation Needs (04/24/2024)  Utilities: Not At Risk (04/25/2024)  Depression (PHQ2-9): Low Risk  (01/09/2021)  Financial Resource Strain: Low Risk (03/22/2024)   Received from [facility]  Physical Activity: Unknown (03/22/2024)   Received from [facility]  Social Connections: Moderately Integrated (03/22/2024)   Received from [facility]  Stress: Stress  Concern Present (03/22/2024)   Received from [facility]  Tobacco Use: Low Risk  (04/23/2024)   SDOH Interventions:     Readmission Risk Interventions     No data to display

## 2024-04-25 NOTE — Consult Note (Signed)
 Physical Medicine and Rehabilitation Consult Reason for Consult: CVA Referring Physician:    HPI: Hannah [name] is a 58 y.o. female who presented on 12/8 with visual changes and aphasia. MRI showed a R dorsal midbrain CVA. PMH includes CAD, CVA with residual right sided weakness, ESRD, HTN, HLD, MI, MS. Physical Medicine & Rehabilitation was consulted to assess candidacy for CIR.  She is currently ambulating Mod A 2 feet with the RW and transferring with Min to ModA with the RW. She is Min to Mod A for bed mobility.    Home: Home Living Family/patient expects to be discharged to:: Private residence Living Arrangements: Children Available Help at Discharge: Family, Available 24 hours/day Type of Home: Apartment Home Access: Stairs to enter [employer] of Steps: threshold Entrance Stairs-Rails: None Home Layout: One level Bathroom Shower/Tub: Engineer, Manufacturing Systems: Standard Home Equipment: Tub bench, Agricultural Consultant (2 wheels), BSC/3in1, Wheelchair - manual, Cane - single point  Functional History: Prior Function Prior Level of Function : Needs assist Mobility Comments: son or daughter assist the pt in stand pivot transfers to her manual wheelchair. Pt's MWC does not fit in the bathroom so she walks with min assistance for ~5-10' to reach the commode or tub ADLs Comments: family assisting with bathing and dressing but daughter feels like pt has potential to be able to do more. family assists with IADLs Functional Status:  Mobility: Bed Mobility Overal bed mobility: Needs Assistance Bed Mobility: Rolling, Sidelying to Sit, Sit to Sidelying Rolling: Min assist Sidelying to sit: Mod assist, HOB elevated Sit to sidelying: Mod assist Transfers Overall transfer level: Needs assistance Equipment used: 1 person hand held assist, Rolling walker (2 wheels) Transfers: Sit to/from Stand, Bed to chair/wheelchair/BSC Sit to Stand: Min assist, From elevated  surface Bed to/from chair/wheelchair/BSC transfer type:: Stand pivot Stand pivot transfers: Mod assist General transfer comment: assist via gait belt from PT, pt requries facilitation of pivot at hips by PT during stand pivot transfer to/from wheelchair. Ambulation/Gait Ambulation/Gait assistance: Mod assist Gait Distance (Feet): 2 Feet Assistive device: Rolling walker (2 wheels) Gait Pattern/deviations: Step-to pattern, Decreased step length - right, Decreased step length - left, Decreased stance time - right, Decreased stance time - left, Decreased dorsiflexion - right, Decreased dorsiflexion - left General Gait Details: pt with slowed step-to gait, reduced foot clearance bilaterally and increased time in dual support stance phase. Posterior lean prevalent which PT provides modA to correct constantly throughout gait trial. Distance limited due to MD arrival Gait velocity: reduced Gait velocity interpretation: <1.31 ft/sec, indicative of household ambulator    ADL:    Cognition: Cognition Orientation Level: Oriented X4 Cognition Arousal: Alert Behavior During Therapy: WFL for tasks assessed/performed   ROS +R>L sided wekaness Past Medical History:  Diagnosis Date   CAD S/P PCI x 2 2009   MI 2009 -> PCI x 2 ([place]); 01/2016 Nuc ST Nonischemic with Normal EF   Cerebral infarction (HCC) 07/12/2017   2009 first one, has had a total of 4 stokes- last 2021   Chronic kidney disease    Hemo M-W- F   Diabetes type 2, controlled (HCC)    Hemiparesis affecting right side as late effect of cerebrovascular accident (CVA) (HCC)    Hyperlipidemia    Hypertension    Intractable vomiting with nausea 07/12/2017   Multiple sclerosis 2009   Myocardial infarction [facility]) 2009   Stroke [facility])    Past Surgical History:  Procedure Laterality Date  A/V FISTULAGRAM Right 02/12/2021   Procedure: A/V FISTULAGRAM;  Surgeon: Gretta Lonni PARAS, MD;  Location: [facility] INVASIVE CV LAB;  Service:  Cardiovascular;  Laterality: Right;   A/V FISTULAGRAM Right 05/20/2022   Procedure: A/V Fistulagram;  Surgeon: Gretta Lonni PARAS, MD;  Location: [facility] INVASIVE CV LAB;  Service: Cardiovascular;  Laterality: Right;   A/V FISTULAGRAM N/A 05/05/2023   Procedure: A/V Fistulagram;  Surgeon: Melia Lynwood ORN, MD;  Location: [facility] INVASIVE CV LAB;  Service: Cardiovascular;  Laterality: N/A;   A/V SHUNT INTERVENTION N/A 08/16/2023   Procedure: A/V SHUNT INTERVENTION;  Surgeon: Tobie Gordy POUR, MD;  Location: [facility] INVASIVE CV LAB;  Service: Cardiovascular;  Laterality: N/A;   A/V SHUNTOGRAM Right 09/26/2023   Procedure: A/V SHUNTOGRAM;  Surgeon: Pearline Norman RAMAN, MD;  Location: [facility] INVASIVE CV LAB;  Service: Cardiovascular;  Laterality: Right;   APPENDECTOMY     AV FISTULA PLACEMENT Right 10/02/2020   Procedure: RIGHT ARTERIOVENOUS (AV) FISTULA CREATION;  Surgeon: Sheree Penne Lonni, MD;  Location: [facility] OR;  Service: Vascular;  Laterality: Right;   AV FISTULA PLACEMENT Right 05/28/2022   Procedure: RIGHT ARM ARTERIOVENOUS (AV) FISTULA REVISION WITH GORETEX GRAFT;  Surgeon: Gretta Lonni PARAS, MD;  Location: [facility] OR;  Service: Vascular;  Laterality: Right;   BASCILIC VEIN TRANSPOSITION Right 11/24/2020   Procedure: SECOND STAGE RIGHT ARM BASCILIC VEIN TRANSPOSITION;  Surgeon: Gretta Lonni PARAS, MD;  Location: [facility] OR;  Service: Vascular;  Laterality: Right;   CYSTOSTOMY W/ BLADDER BIOPSY     INSERTION OF DIALYSIS CATHETER Right 05/28/2022   Procedure: INSERTION OF TUNNELED DIALYSIS CATHETER IN THE RIGHT INTERNAL JUGULAR;  Surgeon: Gretta Lonni PARAS, MD;  Location: [facility] OR;  Service: Vascular;  Laterality: Right;   IR FLUORO GUIDE CV LINE RIGHT  09/29/2020   IR US  GUIDE VASC ACCESS RIGHT  09/29/2020   NM MYOVIEW  LTD  12/25/2015   [place], [place] - NORMAL /NEGATIVE for ischemia. Normal LV Fxn   OVARIAN CYST SURGERY     PERCUTANEOUS CORONARY STENT INTERVENTION (PCI-S)  2009   [place], [place] - 2 stents in settng of  MI -- unknown details   PERIPHERAL VASCULAR BALLOON ANGIOPLASTY Right 02/12/2021   Procedure: PERIPHERAL VASCULAR BALLOON ANGIOPLASTY;  Surgeon: Gretta Lonni PARAS, MD;  Location: [facility] INVASIVE CV LAB;  Service: Cardiovascular;  Laterality: Right;  UPPER ARM FISTULA   PERIPHERAL VASCULAR BALLOON ANGIOPLASTY  05/20/2022   Procedure: PERIPHERAL VASCULAR BALLOON ANGIOPLASTY;  Surgeon: Gretta Lonni PARAS, MD;  Location: [facility] INVASIVE CV LAB;  Service: Cardiovascular;;  rt arm fistula site   PERIPHERAL VASCULAR BALLOON ANGIOPLASTY Right 01/28/2022   Procedure: PERIPHERAL VASCULAR BALLOON ANGIOPLASTY;  Surgeon: Gretta Lonni PARAS, MD;  Location: [facility] INVASIVE CV LAB;  Service: Cardiovascular;  Laterality: Right;  Arm fistula   PERIPHERAL VASCULAR BALLOON ANGIOPLASTY  05/05/2023   Procedure: PERIPHERAL VASCULAR BALLOON ANGIOPLASTY;  Surgeon: Melia Lynwood ORN, MD;  Location: [facility] INVASIVE CV LAB;  Service: Cardiovascular;;  RUA AVF   TRANSTHORACIC ECHOCARDIOGRAM  07/11/2017   a) 12/2015, [place], [place]): Moderate Conc LVH w/ sigmoid septum.  No LVOT obstruction.  Hyperdynamic LV fxn - EF 65 to 70%.  GR 1 DD & high LAP/LVEDP.  AoV Sclerosis - No AS;; b) 06/2017: [facility]): Normal LV Fxn - EF 60%.  Abnormal septal motion.  Mild LVH mild MAC.  Negative bubble study (for CVA)   TRANSTHORACIC ECHOCARDIOGRAM  12/2020   a) 05/2020: EF 60-65%. No RWMA. Gr2DD. Sm posterior pericardial effusion. No AS.; b) 09/2020: EF  70-75%.  Hyperdynamic.  No RWMA. ? D Fxn/LAP.  Mildly elevated PAP.  Pericardial effusion.  Mild ao V sclerosis cyst.; c) 12/2020 (Limited): EF 65-70%. No RWMA. Gr1-2 DD. Mild Conc LVH- high LAP. Nl PAP. Circumferential effusion w/o tamponade. AoV Sclerosis - no AS. Nl RAP.   ULTRASOUND GUIDANCE FOR VASCULAR ACCESS Right 05/28/2022   Procedure: ULTRASOUND GUIDANCE FOR VASCULAR ACCESS;  Surgeon: Gretta Lonni PARAS, MD;  Location: [facility] OR;  Service: Vascular;  Laterality: Right;   VENOUS  ANGIOPLASTY Right 08/16/2023   Procedure: VENOUS ANGIOPLASTY;  Surgeon: Tobie Gordy POUR, MD;  Location: [facility] INVASIVE CV LAB;  Service: Cardiovascular;  Laterality: Right;  70% Innominate; 70% Outflow Basilic   History reviewed. No pertinent family history. Social History:  reports that she has never smoked. She has never used smokeless tobacco. She reports that she does not currently use alcohol. She reports that she does not use drugs. Allergies:  Allergies  Allergen Reactions   Ibuprofen Swelling    Swelling to hands, feet and face    Influenza Vaccines Anaphylaxis and Swelling    fever   Pneumococcal Vaccine Anaphylaxis and Swelling    fever   Promethazine Other (See Comments)    hallucinations   Ambien  [Zolpidem ] Other (See Comments)    Unknown reaction   Codeine Itching   Cyclobenzaprine Other (See Comments)    hallucinations    Hydrocodone -Acetaminophen  Itching    Takes now and has not had many issues.   Tape Itching   Tramadol  Hcl Other (See Comments)    hallucinations   Wound Dressing Adhesive Itching    Use paper tape   Oxycodone  Nausea And Vomiting   Medications Prior to Admission  Medication Sig Dispense Refill   acetaminophen  (TYLENOL ) 500 MG tablet Take 500 mg by mouth every 6 (six) hours as needed for mild pain (pain score 1-3), moderate pain (pain score 4-6), fever or headache.     amLODipine  (NORVASC ) 10 MG tablet Take 1 tablet (10 mg total) by mouth daily. Except on dialysis days do not take a dose. 30 tablet 0   azaTHIOprine  (IMURAN ) 50 MG tablet Take 50 mg by mouth daily.     calcium  acetate (PHOSLO ) 667 MG capsule Take 667 mg by mouth in the morning, at noon, and at bedtime.     carvedilol  (COREG ) 25 MG tablet Take 1 tablet (25 mg total) by mouth 2 (two) times daily with a meal. And as directed. 60 tablet 0   clopidogrel  (PLAVIX ) 75 MG tablet Take 1 tablet (75 mg total) by mouth daily. 30 tablet 0   diclofenac  Sodium (VOLTAREN ) 1 % GEL Apply 2 g topically 4  (four) times daily. To leg (Patient taking differently: Apply 2 g topically 4 (four) times daily as needed (pain). To leg) 350 g 0   famotidine  (PEPCID ) 20 MG tablet Take 1 tablet (20 mg total) by mouth daily. 30 tablet 0   hydrALAZINE  (APRESOLINE ) 25 MG tablet Take 1 tablet (25 mg total) by mouth 2 (two) times daily. 180 tablet 3   HYDROcodone -acetaminophen  (NORCO/VICODIN) 5-325 MG tablet Take 1 tablet by mouth every 12 (twelve) hours as needed for moderate pain (pain score 4-6) or severe pain (pain score 7-10).     insulin  degludec (TRESIBA  FLEXTOUCH) 100 UNIT/ML FlexTouch Pen Inject 12 Units into the skin at bedtime. (Patient taking differently: Inject 8-10 Units into the skin at bedtime.) 15 mL 0   insulin  lispro (HUMALOG ) 100 UNIT/ML KwikPen Inject 5 Units into the skin  3 (three) times daily. (Patient taking differently: Inject 4-6 Units into the skin 3 (three) times daily.) 15 mL 0   isosorbide  mononitrate (IMDUR ) 30 MG 24 hr tablet Take 1 tablet (30 mg total) by mouth at bedtime. 90 tablet 3   lidocaine -prilocaine  (EMLA ) cream Apply 1 Application topically daily as needed (port access).     metoCLOPramide  (REGLAN ) 5 MG tablet Take 5 mg by mouth every 6 (six) hours as needed for nausea or vomiting.     naloxone (NARCAN) nasal spray 4 mg/0.1 mL Place 0.4 mg into the nose as needed.     nitroGLYCERIN  (NITROSTAT ) 0.4 MG SL tablet Place 1 tablet (0.4 mg total) under the tongue every 5 (five) minutes as needed for chest pain. 30 tablet 12   ondansetron  (ZOFRAN ) 4 MG tablet Take 4 mg by mouth every 8 (eight) hours as needed for nausea or vomiting.     polyethylene glycol (MIRALAX  / GLYCOLAX ) 17 g packet Take 17 g by mouth daily. (Patient taking differently: Take 17 g by mouth daily as needed for moderate constipation.) 30 each 0   rosuvastatin  (CRESTOR ) 20 MG tablet Take 20 mg by mouth daily.     senna-docusate (SENOKOT-S) 8.6-50 MG tablet Take 1 tablet by mouth 2 (two) times daily as needed for  moderate constipation. (Patient taking differently: Take 1 tablet by mouth 2 (two) times daily as needed for mild constipation or moderate constipation.) 60 tablet 0   Continuous Blood Gluc Sensor (FREESTYLE LIBRE 2 SENSOR) MISC USE AS DIRECTED CHANGE EVERY 14 DAYS       Blood pressure (!) 180/73, pulse 87, temperature 98.5 F (36.9 C), temperature source Oral, resp. rate 18, weight 46.3 kg, SpO2 99%. Physical Exam Gen: no distress, normal appearing HEENT: oral mucosa pink and moist, NCAT Cardio: Reg rate Chest: normal effort, normal rate of breathing Abd: soft, non-distended Ext: no edema Psych: pleasant, normal affect Skin: intact Neuro: Alert and oriented. 3/5 strength in RUE and 4/5 in LUE, 3/5 strength in RLE and 4/5 in LLE, sensation is intact, has double vision and decreased peripheral vision  Results for orders placed or performed during the hospital encounter of 04/23/24 (from the past 24 hours)  CBG monitoring, ED     Status: Abnormal   Collection Time: 04/24/24 12:40 PM  Result Value Ref Range   Glucose-Capillary 303 (H) 70 - 99 mg/dL  CBG monitoring, ED     Status: Abnormal   Collection Time: 04/24/24  4:47 PM  Result Value Ref Range   Glucose-Capillary 255 (H) 70 - 99 mg/dL  Platelet inhibition [id number] (not at [facility])     Status: None   Collection Time: 04/24/24  6:05 PM  Result Value Ref Range   Platelet Function  P2Y12 229 182 - 335 PRU  Glucose, capillary     Status: Abnormal   Collection Time: 04/24/24  9:08 PM  Result Value Ref Range   Glucose-Capillary 171 (H) 70 - 99 mg/dL   Comment 1 Notify RN    Comment 2 Document in Chart   Glucose, capillary     Status: None   Collection Time: 04/25/24  6:07 AM  Result Value Ref Range   Glucose-Capillary 95 70 - 99 mg/dL   Comment 1 Notify RN    Comment 2 Document in Chart   Glucose, capillary     Status: Abnormal   Collection Time: 04/25/24 11:17 AM  Result Value Ref Range   Glucose-Capillary 250 (H) 70 - 99 mg/dL  ECHOCARDIOGRAM COMPLETE Result Date: 04/24/2024    ECHOCARDIOGRAM REPORT   Patient Name:   Hannah [name] Date of Exam: 04/24/2024 Medical Rec #:  [id number]    Height:       62.0 in Accession #:    [id number]   Weight:       102.1 lb Date of Birth:  09-Mar-1966   BSA:          1.436 m Patient Age:    58 years     BP:           190/74 mmHg Patient Gender: F            HR:           85 bpm. Exam Location:  Inpatient Procedure: 2D Echo (Both Spectral and Color Flow Doppler were utilized during            procedure). Indications:    Stroke  History:        Patient has prior history of Echocardiogram examinations.                 Stroke.  Sonographer:    Norleen Amour Referring Phys: [id number] ROBERT DORRELL IMPRESSIONS  1. Left ventricular ejection fraction, by estimation, is 65 to 70%. The left ventricle has normal function. The left ventricle has no regional wall motion abnormalities. There is mild concentric left ventricular hypertrophy. Left ventricular diastolic parameters were normal.  2. Right ventricular systolic function is normal. The right ventricular size is normal. There is normal pulmonary artery systolic pressure.  3. A small pericardial effusion is present. The pericardial effusion is circumferential. There is no evidence of cardiac tamponade.  4. The mitral valve is normal in structure. No evidence of mitral valve regurgitation. No evidence of mitral stenosis.  5. The aortic valve is normal in structure. Aortic valve regurgitation is mild to moderate. No aortic stenosis is present.  6. Abdominal aorta is normal sized.  7. The inferior vena cava is normal in size with greater than 50% respiratory variability, suggesting right atrial pressure of 3 mmHg. FINDINGS  Left Ventricle: Left ventricular ejection fraction, by estimation, is 65 to 70%. The left ventricle has normal function. The left ventricle has no regional wall motion abnormalities. The left ventricular internal cavity size was normal in size.  There is  mild concentric left ventricular hypertrophy. Left ventricular diastolic parameters were normal. Right Ventricle: The right ventricular size is normal. No increase in right ventricular wall thickness. Right ventricular systolic function is normal. There is normal pulmonary artery systolic pressure. The tricuspid regurgitant velocity is 2.27 m/s, and  with an assumed right atrial pressure of 3 mmHg, the estimated right ventricular systolic pressure is 23.6 mmHg. Left Atrium: Left atrial size was normal in size. Right Atrium: Right atrial size was normal in size. Pericardium: A small pericardial effusion is present. The pericardial effusion is circumferential. There is no evidence of cardiac tamponade. Mitral Valve: The mitral valve is normal in structure. No evidence of mitral valve regurgitation. No evidence of mitral valve stenosis. MV peak gradient, 4.8 mmHg. The mean mitral valve gradient is 3.0 mmHg. Tricuspid Valve: The tricuspid valve is normal in structure. Tricuspid valve regurgitation is not demonstrated. No evidence of tricuspid stenosis. Aortic Valve: The aortic valve is normal in structure. Aortic valve regurgitation is mild to moderate. No aortic stenosis is present. Aortic valve mean gradient measures 3.0 mmHg. Aortic valve peak gradient measures 5.3 mmHg. Aortic valve area, by VTI measures 3.72 cm.  Pulmonic Valve: The pulmonic valve was normal in structure. Pulmonic valve regurgitation is not visualized. No evidence of pulmonic stenosis. Aorta: Abdominal aorta is normal sized. The aortic root is normal in size and structure. Venous: The inferior vena cava is normal in size with greater than 50% respiratory variability, suggesting right atrial pressure of 3 mmHg. IAS/Shunts: No atrial level shunt detected by color flow Doppler.  LEFT VENTRICLE PLAX 2D LVIDd:         3.70 cm     Diastology LVIDs:         1.90 cm     LV e' medial:    6.64 cm/s LV PW:         1.20 cm     LV E/e' medial:  13.4  LV IVS:        1.30 cm     LV e' lateral:   7.62 cm/s LVOT diam:     1.90 cm     LV E/e' lateral: 11.7 LV SV:         70 LV SV Index:   49 LVOT Area:     2.84 cm  LV Volumes (MOD) LV vol d, MOD A2C: 59.4 ml LV vol d, MOD A4C: 67.3 ml LV vol s, MOD A2C: 15.2 ml LV vol s, MOD A4C: 19.2 ml LV SV MOD A2C:     44.2 ml LV SV MOD A4C:     67.3 ml LV SV MOD BP:      47.7 ml RIGHT VENTRICLE             IVC RV Basal diam:  2.40 cm     IVC diam: 1.10 cm RV S prime:     15.70 cm/s TAPSE (M-mode): 2.2 cm      PULMONARY VEINS                             Diastolic Velocity: 51.40 cm/s                             S/D Velocity:       0.90                             Systolic Velocity:  48.20 cm/s LEFT ATRIUM             Index        RIGHT ATRIUM          Index LA diam:        3.40 cm 2.37 cm/m   RA Area:     6.82 cm LA Vol (A2C):   37.9 ml 26.39 ml/m  RA Volume:   10.50 ml 7.31 ml/m LA Vol (A4C):   43.1 ml 30.01 ml/m LA Biplane Vol: 44.2 ml 30.77 ml/m  AORTIC VALVE                    PULMONIC VALVE AV Area (Vmax):    3.01 cm     PV Vmax:       1.07 m/s AV Area (Vmean):   2.61 cm     PV Peak grad:  4.6 mmHg AV Area (VTI):     3.72 cm AV Vmax:           115.00 cm/s AV Vmean:          85.000 cm/s  AV VTI:            0.189 m AV Peak Grad:      5.3 mmHg AV Mean Grad:      3.0 mmHg LVOT Vmax:         122.00 cm/s LVOT Vmean:        78.100 cm/s LVOT VTI:          0.248 m LVOT/AV VTI ratio: 1.31  AORTA Ao Root diam: 2.50 cm Ao Asc diam:  2.70 cm MITRAL VALVE                TRICUSPID VALVE MV Area (PHT): 5.58 cm     TR Peak grad:   20.6 mmHg MV Area VTI:   2.76 cm     TR Vmax:        227.00 cm/s MV Peak grad:  4.8 mmHg MV Mean grad:  3.0 mmHg     SHUNTS MV Vmax:       1.09 m/s     Systemic VTI:  0.25 m MV Vmean:      80.6 cm/s    Systemic Diam: 1.90 cm MV Decel Time: 136 msec MV E velocity: 89.10 cm/s MV A velocity: 121.00 cm/s MV E/A ratio:  0.74 Kardie Tobb DO Electronically signed by Dub Huntsman DO Signature Date/Time:  04/24/2024/2:38:49 PM    Final    CT ANGIO HEAD NECK W WO CM Result Date: 04/24/2024 CLINICAL DATA:  Stroke/TIA EXAM: CT ANGIOGRAPHY HEAD AND NECK WITH AND WITHOUT CONTRAST TECHNIQUE: Multidetector CT imaging of the head and neck was performed using the standard protocol during bolus administration of intravenous contrast. Multiplanar CT image reconstructions and MIPs were obtained to evaluate the vascular anatomy. Carotid stenosis measurements (when applicable) are obtained utilizing NASCET criteria, using the distal internal carotid diameter as the denominator. RADIATION DOSE REDUCTION: This exam was performed according to the departmental dose-optimization program which includes automated exposure control, adjustment of the mA and/or kV according to patient size and/or use of iterative reconstruction technique. CONTRAST:  75mL OMNIPAQUE  IOHEXOL  350 MG/ML SOLN COMPARISON:  Brain MRI April 23, 2024 FINDINGS: CT HEAD: There is low-attenuation in the cerebral white matter There is no hemorrhage. No acute ischemic changes. No mass lesion. The ventricles are normal. Skull/sinuses/orbits: No significant abnormality. CTA NECK: CTA NECK Aortic arch: No proximal vessel stenosis. Right carotid: There is a small amount of plaque with less than 50% stenosis Left carotid: Small amount of plaque with less than 50% stenosis Right vertebral: Normal Left vertebral: Normal Soft tissues: No significant abnormality Other comments: None CTA HEAD: CTA HEAD Right anterior circulation: The internal carotid artery is patent without significant stenosis. The anterior and middle cerebral arteries are patent without significant stenosis or proximal branch occlusion. No aneurysm. Left anterior circulation: The internal carotid artery is patent without significant stenosis. The anterior and middle cerebral arteries are patent without significant stenosis or proximal branch occlusion. No aneurysm. Posterior circulation: Both vertebral  arteries are patent intracranially. No basilar stenosis. There is mild stenosis of the P1 segment of the right posterior cerebral artery. IMPRESSION: 1. Bilateral carotid plaque with less than 50% stenosis 2. No significant vertebrobasilar or intracranial disease 3. Chronic white matter abnormalities. No acute abnormality noted on the CT. Electronically Signed   By: Nancyann Burns M.D.   On: 04/24/2024 09:43   MR BRAIN WO CONTRAST Result Date: 04/23/2024 EXAM: MRI BRAIN WITHOUT CONTRAST 04/23/2024 05:42:47 PM TECHNIQUE: Multiplanar multisequence MRI of the head/brain was performed  without the administration of intravenous contrast. COMPARISON: Same day CT head and MRI head 09/23/2023. CLINICAL HISTORY: Vision loss, known etiology. FINDINGS: BRAIN AND VENTRICLES: There is a 4 mm focus of acute infarct within the right dorsal aspect of the midbrain partially involving the periaqueductal gray matter seen on series 2 image 22. No significant associated edema or mass effect. Remote lacunar infarcts are present in the bilateral basal ganglia and thalami, left greater than right, and in the right corona radiata. Volume loss in the left cerebral peduncle is likely related to prior infarcts. T2 and FLAIR hyperintensity in the periventricular and subcortical white matter with additional signal abnormality in the pons is suggestive of moderate chronic microvascular ischemic changes. Multiple scattered chronic microhemorrhages are present, particularly within the thalami and brainstem, likely related to hypertensive encephalopathy. Few additional smaller chronic microhemorrhages are seen in the cerebral hemispheres. There is mild generalized parenchymal volume loss. No mass. No midline shift. No hydrocephalus. The sella is unremarkable. Normal flow voids. ORBITS: Bilateral lens replacement. SINUSES AND MASTOIDS: Mucosal thickening in the left sphenoid sinus. BONES AND SOFT TISSUES: Normal marrow signal. Degenerative changes in  the visualized upper cervical spine. Prominent signal abnormality at C4-C5 likely reflecting chronic degenerative endplate changes. No acute soft tissue abnormality. IMPRESSION: 1. Acute infarct in the right dorsal midbrain measuring 4 mm, partially involving the periaqueductal gray matter. No significant edema or mass effect. 2. Multiple scattered chronic microhemorrhages, particularly within the thalami and brainstem, likely related to hypertensive encephalopathy. 3. Moderate chronic microvascular ischemic changes, mild generalized parenchymal volume loss, remote lacunar infarcts as above. Electronically signed by: Donnice Mania MD 04/23/2024 06:01 PM EST RP Workstation: [id number]   CT HEAD WO CONTRAST Result Date: 04/23/2024 EXAM: CT HEAD WITHOUT CONTRAST 04/23/2024 02:19:26 PM TECHNIQUE: CT of the head was performed without the administration of intravenous contrast. Automated exposure control, iterative reconstruction, and/or weight based adjustment of the mA/kV was utilized to reduce the radiation dose to as low as reasonably achievable. COMPARISON: 09/23/2023 CLINICAL HISTORY: Neuro deficit, acute, stroke suspected. FINDINGS: BRAIN AND VENTRICLES: No acute hemorrhage. No evidence of acute infarct. Patchy and confluent decreased attenuation throughout deep and periventricular white matter of cerebral hemispheres bilaterally, compatible with chronic microvascular ischemic disease. Chronic right corona radiata, left basal ganglia lacunar infarcts. No hydrocephalus. No extra-axial collection. No mass effect or midline shift. ORBITS: Bilateral lens replacement. SINUSES: Left sphenoid sinus mucosal thickening. SOFT TISSUES AND SKULL: No acute soft tissue abnormality. No skull fracture. VASCULATURE: Atherosclerotic calcifications within cavernous internal carotid arteries. IMPRESSION: 1. No acute intracranial abnormality. 2. Moderate chronic microvascular ischemic disease. Chronic right corona radiata and left  basal ganglia lacunar infarcts. Electronically signed by: Donnice Mania MD 04/23/2024 02:41 PM EST RP Workstation: [id number]    Assessment/Plan: Diagnosis: Acute R midbrain infarct Does the need for close, 24 hr/day medical supervision in concert with the patient's rehab needs make it unreasonable for this patient to be served in a less intensive setting? Yes Co-Morbidities requiring supervision/potential complications:  1) Diabetes: continue insulin  2) HTN: plan to normalize BM in 1-2 days 3) HLD: continue Crestor  4) Leukopenia: continue to monitor WBC 5) ESRD: continue to monitor creatinine Due to bladder management, bowel management, safety, skin/wound care, disease management, medication administration, pain management, and patient education, does the patient require 24 hr/day rehab nursing? Yes Does the patient require coordinated care of a physician, rehab nurse, therapy disciplines of PT, OT, SLP to address physical and functional deficits in the context of the above medical  diagnosis(es)? Yes Addressing deficits in the following areas: balance, endurance, locomotion, strength, transferring, bowel/bladder control, bathing, dressing, feeding, grooming, toileting, speech, and psychosocial support Can the patient actively participate in an intensive therapy program of at least 3 hrs of therapy per day at least 5 days per week? Yes The potential for patient to make measurable gains while on inpatient rehab is excellent Anticipated functional outcomes upon discharge from inpatient rehab are modified independent  with PT, modified independent with OT, modified independent with SLP. Estimated rehab length of stay to reach the above functional goals is: 10-14 days Anticipated discharge destination: Home Overall Rehab/Functional Prognosis: excellent  POST ACUTE RECOMMENDATIONS: This patient's condition is appropriate for continued rehabilitative care in the following setting: CIR Patient has  agreed to participate in recommended program. Yes Note that insurance prior authorization may be required for reimbursement for recommended care.   I have personally performed a face to face diagnostic evaluation of this patient. Additionally, I have examined the patient's medical record including any pertinent labs and radiographic images.    Thanks,  Sven SHAUNNA Elks, MD 04/25/2024

## 2024-04-25 NOTE — Progress Notes (Signed)
 STROKE TEAM PROGRESS NOTE   SUBJECTIVE (INTERVAL HISTORY) PT is at bedside. Pt lying in bed, awake alert eyes open, still has mild opsoclonus but improved. Gaze dysconjugate also improved.   OBJECTIVE Temp:  [97.8 F (36.6 C)-98.5 F (36.9 C)] 98.5 F (36.9 C) (12/10 1139) Pulse Rate:  [80-87] 87 (12/10 1139) Cardiac Rhythm: Normal sinus rhythm (12/10 0700) Resp:  [17-18] 18 (12/10 0359) BP: (158-185)/(66-73) 180/73 (12/10 1139) SpO2:  [98 %-100 %] 99 % (12/10 1139)  Recent Labs  Lab 04/24/24 1240 04/24/24 1647 04/24/24 2108 04/25/24 0607 04/25/24 1117  GLUCAP 303* 255* 171* 95 250*   Recent Labs  Lab 04/23/24 1344  NA 137  K 3.4*  CL 97*  CO2 29  GLUCOSE 184*  BUN 9  CREATININE 3.51*  CALCIUM  8.0*   Recent Labs  Lab 04/23/24 1344  AST 15  ALT 8  ALKPHOS 48  BILITOT 0.9  PROT 7.2  ALBUMIN 3.3*   Recent Labs  Lab 04/23/24 1344  WBC 3.0*  NEUTROABS 1.6*  HGB 11.8*  HCT 37.8  MCV 96.7  PLT 163   No results for input(s): CKTOTAL, CKMB, CKMBINDEX, TROPONINI in the last 168 hours. Recent Labs    04/23/24 1344  LABPROT 14.3  INR 1.1   No results for input(s): COLORURINE, LABSPEC, PHURINE, GLUCOSEU, HGBUR, BILIRUBINUR, KETONESUR, PROTEINUR, UROBILINOGEN, NITRITE, LEUKOCYTESUR in the last 72 hours.  Invalid input(s): APPERANCEUR     Component Value Date/Time   CHOL 111 04/24/2024 0456   CHOL 133 04/01/2022 1007   TRIG 44 04/24/2024 0456   HDL 72 04/24/2024 0456   HDL 80 04/01/2022 1007   CHOLHDL 1.5 04/24/2024 0456   VLDL 9 04/24/2024 0456   LDLCALC 30 04/24/2024 0456   LDLCALC 41 04/01/2022 1007   Lab Results  Component Value Date   HGBA1C 8.5 (H) 04/24/2024      Component Value Date/Time   LABOPIA NONE DETECTED 01/12/2021 2100   COCAINSCRNUR NONE DETECTED 01/12/2021 2100   LABBENZ NONE DETECTED 01/12/2021 2100   AMPHETMU NONE DETECTED 01/12/2021 2100   THCU NONE DETECTED 01/12/2021 2100   LABBARB NONE  DETECTED 01/12/2021 2100    Recent Labs  Lab 04/23/24 1344  ETH <15    I have personally reviewed the radiological images below and agree with the radiology interpretations.  ECHOCARDIOGRAM COMPLETE Result Date: 04/24/2024    ECHOCARDIOGRAM REPORT   Patient Name:   Hannah [name] Date of Exam: 04/24/2024 Medical Rec #:  [id number]    Height:       62.0 in Accession #:    [id number]   Weight:       102.1 lb Date of Birth:  [birth year]   BSA:          1.436 m Patient Age:    58 years     BP:           190/74 mmHg Patient Gender: F            HR:           85 bpm. Exam Location:  Inpatient Procedure: 2D Echo (Both Spectral and Color Flow Doppler were utilized during            procedure). Indications:    Stroke  History:        Patient has prior history of Echocardiogram examinations.                 Stroke.  Sonographer:    Norleen Amour Referring Phys:  [id number] ROBERT DORRELL IMPRESSIONS  1. Left ventricular ejection fraction, by estimation, is 65 to 70%. The left ventricle has normal function. The left ventricle has no regional wall motion abnormalities. There is mild concentric left ventricular hypertrophy. Left ventricular diastolic parameters were normal.  2. Right ventricular systolic function is normal. The right ventricular size is normal. There is normal pulmonary artery systolic pressure.  3. A small pericardial effusion is present. The pericardial effusion is circumferential. There is no evidence of cardiac tamponade.  4. The mitral valve is normal in structure. No evidence of mitral valve regurgitation. No evidence of mitral stenosis.  5. The aortic valve is normal in structure. Aortic valve regurgitation is mild to moderate. No aortic stenosis is present.  6. Abdominal aorta is normal sized.  7. The inferior vena cava is normal in size with greater than 50% respiratory variability, suggesting right atrial pressure of 3 mmHg. FINDINGS  Left Ventricle: Left ventricular ejection fraction, by  estimation, is 65 to 70%. The left ventricle has normal function. The left ventricle has no regional wall motion abnormalities. The left ventricular internal cavity size was normal in size. There is  mild concentric left ventricular hypertrophy. Left ventricular diastolic parameters were normal. Right Ventricle: The right ventricular size is normal. No increase in right ventricular wall thickness. Right ventricular systolic function is normal. There is normal pulmonary artery systolic pressure. The tricuspid regurgitant velocity is 2.27 m/s, and  with an assumed right atrial pressure of 3 mmHg, the estimated right ventricular systolic pressure is 23.6 mmHg. Left Atrium: Left atrial size was normal in size. Right Atrium: Right atrial size was normal in size. Pericardium: A small pericardial effusion is present. The pericardial effusion is circumferential. There is no evidence of cardiac tamponade. Mitral Valve: The mitral valve is normal in structure. No evidence of mitral valve regurgitation. No evidence of mitral valve stenosis. MV peak gradient, 4.8 mmHg. The mean mitral valve gradient is 3.0 mmHg. Tricuspid Valve: The tricuspid valve is normal in structure. Tricuspid valve regurgitation is not demonstrated. No evidence of tricuspid stenosis. Aortic Valve: The aortic valve is normal in structure. Aortic valve regurgitation is mild to moderate. No aortic stenosis is present. Aortic valve mean gradient measures 3.0 mmHg. Aortic valve peak gradient measures 5.3 mmHg. Aortic valve area, by VTI measures 3.72 cm. Pulmonic Valve: The pulmonic valve was normal in structure. Pulmonic valve regurgitation is not visualized. No evidence of pulmonic stenosis. Aorta: Abdominal aorta is normal sized. The aortic root is normal in size and structure. Venous: The inferior vena cava is normal in size with greater than 50% respiratory variability, suggesting right atrial pressure of 3 mmHg. IAS/Shunts: No atrial level shunt detected  by color flow Doppler.  LEFT VENTRICLE PLAX 2D LVIDd:         3.70 cm     Diastology LVIDs:         1.90 cm     LV e' medial:    6.64 cm/s LV PW:         1.20 cm     LV E/e' medial:  13.4 LV IVS:        1.30 cm     LV e' lateral:   7.62 cm/s LVOT diam:     1.90 cm     LV E/e' lateral: 11.7 LV SV:         70 LV SV Index:   49 LVOT Area:     2.84 cm  LV Volumes (MOD) LV  vol d, MOD A2C: 59.4 ml LV vol d, MOD A4C: 67.3 ml LV vol s, MOD A2C: 15.2 ml LV vol s, MOD A4C: 19.2 ml LV SV MOD A2C:     44.2 ml LV SV MOD A4C:     67.3 ml LV SV MOD BP:      47.7 ml RIGHT VENTRICLE             IVC RV Basal diam:  2.40 cm     IVC diam: 1.10 cm RV S prime:     15.70 cm/s TAPSE (M-mode): 2.2 cm      PULMONARY VEINS                             Diastolic Velocity: 51.40 cm/s                             S/D Velocity:       0.90                             Systolic Velocity:  48.20 cm/s LEFT ATRIUM             Index        RIGHT ATRIUM          Index LA diam:        3.40 cm 2.37 cm/m   RA Area:     6.82 cm LA Vol (A2C):   37.9 ml 26.39 ml/m  RA Volume:   10.50 ml 7.31 ml/m LA Vol (A4C):   43.1 ml 30.01 ml/m LA Biplane Vol: 44.2 ml 30.77 ml/m  AORTIC VALVE                    PULMONIC VALVE AV Area (Vmax):    3.01 cm     PV Vmax:       1.07 m/s AV Area (Vmean):   2.61 cm     PV Peak grad:  4.6 mmHg AV Area (VTI):     3.72 cm AV Vmax:           115.00 cm/s AV Vmean:          85.000 cm/s AV VTI:            0.189 m AV Peak Grad:      5.3 mmHg AV Mean Grad:      3.0 mmHg LVOT Vmax:         122.00 cm/s LVOT Vmean:        78.100 cm/s LVOT VTI:          0.248 m LVOT/AV VTI ratio: 1.31  AORTA Ao Root diam: 2.50 cm Ao Asc diam:  2.70 cm MITRAL VALVE                TRICUSPID VALVE MV Area (PHT): 5.58 cm     TR Peak grad:   20.6 mmHg MV Area VTI:   2.76 cm     TR Vmax:        227.00 cm/s MV Peak grad:  4.8 mmHg MV Mean grad:  3.0 mmHg     SHUNTS MV Vmax:       1.09 m/s     Systemic VTI:  0.25 m MV Vmean:      80.6 cm/s    Systemic Diam:  1.90 cm MV Decel Time:  136 msec MV E velocity: 89.10 cm/s MV A velocity: 121.00 cm/s MV E/A ratio:  0.74 Kardie Tobb DO Electronically signed by Dub Huntsman DO Signature Date/Time: 04/24/2024/2:38:49 PM    Final    CT ANGIO HEAD NECK W WO CM Result Date: 04/24/2024 CLINICAL DATA:  Stroke/TIA EXAM: CT ANGIOGRAPHY HEAD AND NECK WITH AND WITHOUT CONTRAST TECHNIQUE: Multidetector CT imaging of the head and neck was performed using the standard protocol during bolus administration of intravenous contrast. Multiplanar CT image reconstructions and MIPs were obtained to evaluate the vascular anatomy. Carotid stenosis measurements (when applicable) are obtained utilizing NASCET criteria, using the distal internal carotid diameter as the denominator. RADIATION DOSE REDUCTION: This exam was performed according to the departmental dose-optimization program which includes automated exposure control, adjustment of the mA and/or kV according to patient size and/or use of iterative reconstruction technique. CONTRAST:  75mL OMNIPAQUE  IOHEXOL  350 MG/ML SOLN COMPARISON:  Brain MRI April 23, 2024 FINDINGS: CT HEAD: There is low-attenuation in the cerebral white matter There is no hemorrhage. No acute ischemic changes. No mass lesion. The ventricles are normal. Skull/sinuses/orbits: No significant abnormality. CTA NECK: CTA NECK Aortic arch: No proximal vessel stenosis. Right carotid: There is a small amount of plaque with less than 50% stenosis Left carotid: Small amount of plaque with less than 50% stenosis Right vertebral: Normal Left vertebral: Normal Soft tissues: No significant abnormality Other comments: None CTA HEAD: CTA HEAD Right anterior circulation: The internal carotid artery is patent without significant stenosis. The anterior and middle cerebral arteries are patent without significant stenosis or proximal branch occlusion. No aneurysm. Left anterior circulation: The internal carotid artery is patent without  significant stenosis. The anterior and middle cerebral arteries are patent without significant stenosis or proximal branch occlusion. No aneurysm. Posterior circulation: Both vertebral arteries are patent intracranially. No basilar stenosis. There is mild stenosis of the P1 segment of the right posterior cerebral artery. IMPRESSION: 1. Bilateral carotid plaque with less than 50% stenosis 2. No significant vertebrobasilar or intracranial disease 3. Chronic white matter abnormalities. No acute abnormality noted on the CT. Electronically Signed   By: Nancyann Burns M.D.   On: 04/24/2024 09:43   MR BRAIN WO CONTRAST Result Date: 04/23/2024 EXAM: MRI BRAIN WITHOUT CONTRAST 04/23/2024 05:42:47 PM TECHNIQUE: Multiplanar multisequence MRI of the head/brain was performed without the administration of intravenous contrast. COMPARISON: Same day CT head and MRI head 09/23/2023. CLINICAL HISTORY: Vision loss, known etiology. FINDINGS: BRAIN AND VENTRICLES: There is a 4 mm focus of acute infarct within the right dorsal aspect of the midbrain partially involving the periaqueductal gray matter seen on series 2 image 22. No significant associated edema or mass effect. Remote lacunar infarcts are present in the bilateral basal ganglia and thalami, left greater than right, and in the right corona radiata. Volume loss in the left cerebral peduncle is likely related to prior infarcts. T2 and FLAIR hyperintensity in the periventricular and subcortical white matter with additional signal abnormality in the pons is suggestive of moderate chronic microvascular ischemic changes. Multiple scattered chronic microhemorrhages are present, particularly within the thalami and brainstem, likely related to hypertensive encephalopathy. Few additional smaller chronic microhemorrhages are seen in the cerebral hemispheres. There is mild generalized parenchymal volume loss. No mass. No midline shift. No hydrocephalus. The sella is unremarkable. Normal  flow voids. ORBITS: Bilateral lens replacement. SINUSES AND MASTOIDS: Mucosal thickening in the left sphenoid sinus. BONES AND SOFT TISSUES: Normal marrow signal. Degenerative changes in the visualized upper cervical spine.  Prominent signal abnormality at C4-C5 likely reflecting chronic degenerative endplate changes. No acute soft tissue abnormality. IMPRESSION: 1. Acute infarct in the right dorsal midbrain measuring 4 mm, partially involving the periaqueductal gray matter. No significant edema or mass effect. 2. Multiple scattered chronic microhemorrhages, particularly within the thalami and brainstem, likely related to hypertensive encephalopathy. 3. Moderate chronic microvascular ischemic changes, mild generalized parenchymal volume loss, remote lacunar infarcts as above. Electronically signed by: Donnice Mania MD 04/23/2024 06:01 PM EST RP Workstation: [id number]   CT HEAD WO CONTRAST Result Date: 04/23/2024 EXAM: CT HEAD WITHOUT CONTRAST 04/23/2024 02:19:26 PM TECHNIQUE: CT of the head was performed without the administration of intravenous contrast. Automated exposure control, iterative reconstruction, and/or weight based adjustment of the mA/kV was utilized to reduce the radiation dose to as low as reasonably achievable. COMPARISON: 09/23/2023 CLINICAL HISTORY: Neuro deficit, acute, stroke suspected. FINDINGS: BRAIN AND VENTRICLES: No acute hemorrhage. No evidence of acute infarct. Patchy and confluent decreased attenuation throughout deep and periventricular white matter of cerebral hemispheres bilaterally, compatible with chronic microvascular ischemic disease. Chronic right corona radiata, left basal ganglia lacunar infarcts. No hydrocephalus. No extra-axial collection. No mass effect or midline shift. ORBITS: Bilateral lens replacement. SINUSES: Left sphenoid sinus mucosal thickening. SOFT TISSUES AND SKULL: No acute soft tissue abnormality. No skull fracture. VASCULATURE: Atherosclerotic calcifications  within cavernous internal carotid arteries. IMPRESSION: 1. No acute intracranial abnormality. 2. Moderate chronic microvascular ischemic disease. Chronic right corona radiata and left basal ganglia lacunar infarcts. Electronically signed by: Donnice Mania MD 04/23/2024 02:41 PM EST RP Workstation: [id number]     PHYSICAL EXAM  Temp:  [97.8 F (36.6 C)-98.5 F (36.9 C)] 98.5 F (36.9 C) (12/10 1139) Pulse Rate:  [80-87] 87 (12/10 1139) Resp:  [17-18] 18 (12/10 0359) BP: (158-185)/(66-73) 180/73 (12/10 1139) SpO2:  [98 %-100 %] 99 % (12/10 1139)  General - Well nourished, well developed, in no apparent distress.  Mental Status -  Level of arousal and orientation to time, place, and person were intact. Language including expression, naming, repetition, comprehension was assessed and found intact. Moderate dysarthria Fund of Knowledge was assessed and was intact.  Cranial Nerves II - XII - II - Substantial tunnel vision, with diminished peripheral vision in all quadrants. III, IV, VI - Extraocular movements impaired, neither eye has superior motion, inferior motion is greatly limited. Both eyes demonstrate mild opsoclonus. Double vision with looking at far.  V - Facial sensation intact bilaterally. VII - L NLF mild flattening at rest, R facial mild weakness on movement VIII - Hearing & vestibular intact bilaterally. X - Palate elevates symmetrically. XI - Chin turning & shoulder shrug intact bilaterally. XII - Tongue protrusion midline  Motor Strength - The patients strength was symmetrical in all extremities and pronator drift was absent.  Bulk was normal and fasciculations were absent.    Sensory - Light touch were assessed and were symmetrical.    Coordination - The patient had normal movements in the hands with no ataxia or dysmetria.  Tremor was absent.  Gait and Station - deferred.   ASSESSMENT/PLAN Hannah [name] is a 57 y.o. female with history of CAD s/p PCI, CVA,  ESRD on dialysis, HTN, and HLD admitted for visual field changes and aphasia.   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   MRI: Acute R dorsal midbrain infarct 4mm in size.  CTA: bilateral Carotid plaque <50% stenosis 2D Echo EF 65-70% LDL: 30 HgbA1c: 8.5 P2Y12 = 229 (6 hours  post plavix ) Heparin  subq for VTE prophylaxis clopidogrel  75 mg daily prior to admission, now on Aspirin  and Brillinta for 30 days, and then Aspirin  81mg  daily indefinitely. Ongoing aggressive stroke risk factor management Therapy recommendations:  CIR Disposition:  pending  History of stroke 2011 had slurred speech and right-sided weakness 12/2016 details not clear 06/2017 cerebellar infarcts, CT head and neck negative. 02/2018 A1c 13.0 LDL 47, put on Plavix  05/2020 right pontine infarct on MRI.  MRI and carotid Doppler negative.  LDL 56 and A1c 8.9.  Discharged on DAPT and Crestor  20. 12/2020 ER visit for altered mental status speech difficulty.  MRI negative for stroke.  LDL 129, A1c 8.4.  2D echo unremarkable. 04/2022 worsening right-sided weakness.  CT, CTA, MRI negative for stroke.  Concerning for recrudescence 09/2023 admitted for aphasia with slow responses.  Found to have a hemoglobin 5.7.  MRI negative.  Suspected recrudescence from severe anemia. Follow with Dr. 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 Please call with questions. Pt will follow up with stroke clinic Dr. Rosemarie at [facility] in about 4 weeks. Thanks for the consult.   Ary Cummins, MD PhD Stroke Neurology 04/25/2024 4:15 PM    To contact Stroke Continuity provider, please refer to [email or web address]. After hours, contact General Neurology

## 2024-04-25 NOTE — Progress Notes (Signed)
 Pt receives out-pt HD at [facility] , MWF, 0715am chair time. Will continue to assist as needed.   Lavanda Delmont Prosch Dialysis Navigator [phone]

## 2024-04-25 NOTE — Progress Notes (Signed)
 PROGRESS NOTE    Hannah [name]  FMW:[id number] DOB: [birth year] DOA: 04/23/2024 PCP: Delayne Artist PARAS, MD    Brief Narrative: This 58 yrs old female with PMH significant for CAD status post PCI, Prior CVA, ESRD on hemodialysis, hypertension, hyperlipidemia, who presented to the ED due to visual field changes and aphasia.  Patient last known well was 04/22/2024. She had a prior stroke and has residual right sided hemiparesis.  Daughter has noticed left-sided facial droop and decreased strength on the left side so she called the EMS.  Patient was found to be hemodynamically stable in the ED, Labs creatinine 3.5, potassium 3.4, CT head no acute strokes,  MRI brain demonstrated acute infarct in the right dorsal midbrain.  Neurology was consulted and patient was admitted for stroke workup.  Assessment & Plan:   Principal Problem:   CVA (cerebral vascular accident) (HCC) Active Problems:   Acute hypoxic respiratory failure (HCC)   Acute CVA (cerebrovascular accident) (HCC)  Acute Right midbrain infarct: Patient presented with left-sided facial droop with decreased strength on the left side.   CT head no acute stroke noted MRI brain demonstrated acute infarct in the right dorsal midbrain. She is out of tPA window. Neurology consult appreciated.  Recommended complete stroke workup. Echo shows LVEF 65 to 70%, no RWMA. CTA Head /Neck  > 50% stenosis LDL under 30 under goal, hemoglobin A1c 8.2. P2 Y12 level pending this afternoon for evaluation of Plavix  effectiveness. Recommend 30 days of aspirin  and Brilinta . After about 30 days resume Plavix  if P2 Y12 levels are appropriately low,  if P2 Y12 levels are normal then aspirin  81 daily indefinitely. Continue Crestor . PT and OT recommended CIR.  Hyperlipidemia: Continue rosuvastatin .  LDL 30.  Hypertension: Gradually normalize BP in 2 to 3 days. Continue hydralazine ,  Imdur , and Coreg .  CAD status post PCI: Continue Plavix ,  Crestor  and  carvedilol .  ESRD on hemodialysis: Patient has no urgent acute indication for dialysis. Nephrology consulted for continuation of hemodialysis.  Chorioretinitis Continue azithroprine.  Diabetes mellitus type 2 Uncontrolled, hemoglobin A1c 8.5. Continue Semglee  10 units daily Continue regular insulin  sliding scale.  DVT prophylaxis: Heparin  subcu Code Status: Full code Family Communication: Daughter at bedside. Disposition Plan:    Status is: Inpatient Remains inpatient appropriate because: Admitted for stroke workup.    Consultants:  Neurology Nephrology.  Procedures:CT head, MRI   Antimicrobials:  Anti-infectives (From admission, onward)    None       Subjective: Patient was seen and examined at bedside.Overnight events noted. Patient is alert and oriented,  following commands.   She reports feeling better,  daughter is at bedside and states she is improving.  Objective: Vitals:   04/24/24 2258 04/25/24 0359 04/25/24 0734 04/25/24 1139  BP: (!) 159/69 (!) 162/69 (!) 158/71 (!) 180/73  Pulse: 84 84 83 87  Resp: 18 18    Temp: 98.3 F (36.8 C) 98.4 F (36.9 C) 98.2 F (36.8 C) 98.5 F (36.9 C)  TempSrc: Oral  Oral Oral  SpO2: 99% 98% 100% 99%  Weight:        Intake/Output Summary (Last 24 hours) at 04/25/2024 1435 Last data filed at 04/25/2024 0817 Gross per 24 hour  Intake 240 ml  Output --  Net 240 ml   Filed Weights   04/24/24 0200  Weight: 46.3 kg    Examination:  General exam: Appears calm and comfortable, deconditioned, not in any acute distress. Respiratory system: CTA Bilaterally. Respiratory effort normal.  RR 13  Cardiovascular system: S1 & S2 heard, RRR. No JVD, murmurs, rubs, gallops or clicks.  Gastrointestinal system: Abdomen is non distended, soft and non tender. Normal bowel sounds heard. Central nervous system: Alert and oriented x 3.  Right-sided weakness from prior stroke. Extremities: No edema, no cyanosis, no  clubbing. Skin: No rashes, lesions or ulcers Psychiatry: Judgement and insight appear normal. Mood & affect appropriate.     Data Reviewed: I have personally reviewed following labs and imaging studies  CBC: Recent Labs  Lab 04/23/24 1344  WBC 3.0*  NEUTROABS 1.6*  HGB 11.8*  HCT 37.8  MCV 96.7  PLT 163   Basic Metabolic Panel: Recent Labs  Lab 04/23/24 1344  NA 137  K 3.4*  CL 97*  CO2 29  GLUCOSE 184*  BUN 9  CREATININE 3.51*  CALCIUM  8.0*   GFR: Estimated Creatinine Clearance: 12.5 mL/min (A) (by C-G formula based on SCr of 3.51 mg/dL (H)). Liver Function Tests: Recent Labs  Lab 04/23/24 1344  AST 15  ALT 8  ALKPHOS 48  BILITOT 0.9  PROT 7.2  ALBUMIN 3.3*   No results for input(s): LIPASE, AMYLASE in the last 168 hours. No results for input(s): AMMONIA in the last 168 hours. Coagulation Profile: Recent Labs  Lab 04/23/24 1344  INR 1.1   Cardiac Enzymes: No results for input(s): CKTOTAL, CKMB, CKMBINDEX, TROPONINI in the last 168 hours. BNP (last 3 results) No results for input(s): PROBNP in the last 8760 hours. HbA1C: Recent Labs    04/24/24 0456  HGBA1C 8.5*   CBG: Recent Labs  Lab 04/24/24 1240 04/24/24 1647 04/24/24 2108 04/25/24 0607 04/25/24 1117  GLUCAP 303* 255* 171* 95 250*   Lipid Profile: Recent Labs    04/24/24 0456  CHOL 111  HDL 72  LDLCALC 30  TRIG 44  CHOLHDL 1.5   Thyroid  Function Tests: No results for input(s): TSH, T4TOTAL, FREET4, T3FREE, THYROIDAB in the last 72 hours. Anemia Panel: No results for input(s): VITAMINB12, FOLATE, FERRITIN, TIBC, IRON, RETICCTPCT in the last 72 hours. Sepsis Labs: No results for input(s): PROCALCITON, LATICACIDVEN in the last 168 hours.  No results found for this or any previous visit (from the past 240 hours).   Radiology Studies: ECHOCARDIOGRAM COMPLETE Result Date: 04/24/2024    ECHOCARDIOGRAM REPORT   Patient Name:   CANIYAH MURLEY Date of Exam: 04/24/2024 Medical Rec #:  [id number]    Height:       62.0 in Accession #:    [id number]   Weight:       102.1 lb Date of Birth:  1966-05-17   BSA:          1.436 m Patient Age:    58 years     BP:           190/74 mmHg Patient Gender: F            HR:           85 bpm. Exam Location:  Inpatient Procedure: 2D Echo (Both Spectral and Color Flow Doppler were utilized during            procedure). Indications:    Stroke  History:        Patient has prior history of Echocardiogram examinations.                 Stroke.  Sonographer:    Norleen Amour Referring Phys: [id number] ROBERT DORRELL IMPRESSIONS  1. Left ventricular ejection fraction, by estimation, is 65 to  70%. The left ventricle has normal function. The left ventricle has no regional wall motion abnormalities. There is mild concentric left ventricular hypertrophy. Left ventricular diastolic parameters were normal.  2. Right ventricular systolic function is normal. The right ventricular size is normal. There is normal pulmonary artery systolic pressure.  3. A small pericardial effusion is present. The pericardial effusion is circumferential. There is no evidence of cardiac tamponade.  4. The mitral valve is normal in structure. No evidence of mitral valve regurgitation. No evidence of mitral stenosis.  5. The aortic valve is normal in structure. Aortic valve regurgitation is mild to moderate. No aortic stenosis is present.  6. Abdominal aorta is normal sized.  7. The inferior vena cava is normal in size with greater than 50% respiratory variability, suggesting right atrial pressure of 3 mmHg. FINDINGS  Left Ventricle: Left ventricular ejection fraction, by estimation, is 65 to 70%. The left ventricle has normal function. The left ventricle has no regional wall motion abnormalities. The left ventricular internal cavity size was normal in size. There is  mild concentric left ventricular hypertrophy. Left ventricular diastolic parameters were  normal. Right Ventricle: The right ventricular size is normal. No increase in right ventricular wall thickness. Right ventricular systolic function is normal. There is normal pulmonary artery systolic pressure. The tricuspid regurgitant velocity is 2.27 m/s, and  with an assumed right atrial pressure of 3 mmHg, the estimated right ventricular systolic pressure is 23.6 mmHg. Left Atrium: Left atrial size was normal in size. Right Atrium: Right atrial size was normal in size. Pericardium: A small pericardial effusion is present. The pericardial effusion is circumferential. There is no evidence of cardiac tamponade. Mitral Valve: The mitral valve is normal in structure. No evidence of mitral valve regurgitation. No evidence of mitral valve stenosis. MV peak gradient, 4.8 mmHg. The mean mitral valve gradient is 3.0 mmHg. Tricuspid Valve: The tricuspid valve is normal in structure. Tricuspid valve regurgitation is not demonstrated. No evidence of tricuspid stenosis. Aortic Valve: The aortic valve is normal in structure. Aortic valve regurgitation is mild to moderate. No aortic stenosis is present. Aortic valve mean gradient measures 3.0 mmHg. Aortic valve peak gradient measures 5.3 mmHg. Aortic valve area, by VTI measures 3.72 cm. Pulmonic Valve: The pulmonic valve was normal in structure. Pulmonic valve regurgitation is not visualized. No evidence of pulmonic stenosis. Aorta: Abdominal aorta is normal sized. The aortic root is normal in size and structure. Venous: The inferior vena cava is normal in size with greater than 50% respiratory variability, suggesting right atrial pressure of 3 mmHg. IAS/Shunts: No atrial level shunt detected by color flow Doppler.  LEFT VENTRICLE PLAX 2D LVIDd:         3.70 cm     Diastology LVIDs:         1.90 cm     LV e' medial:    6.64 cm/s LV PW:         1.20 cm     LV E/e' medial:  13.4 LV IVS:        1.30 cm     LV e' lateral:   7.62 cm/s LVOT diam:     1.90 cm     LV E/e' lateral:  11.7 LV SV:         70 LV SV Index:   49 LVOT Area:     2.84 cm  LV Volumes (MOD) LV vol d, MOD A2C: 59.4 ml LV vol d, MOD A4C: 67.3 ml LV vol  s, MOD A2C: 15.2 ml LV vol s, MOD A4C: 19.2 ml LV SV MOD A2C:     44.2 ml LV SV MOD A4C:     67.3 ml LV SV MOD BP:      47.7 ml RIGHT VENTRICLE             IVC RV Basal diam:  2.40 cm     IVC diam: 1.10 cm RV S prime:     15.70 cm/s TAPSE (M-mode): 2.2 cm      PULMONARY VEINS                             Diastolic Velocity: 51.40 cm/s                             S/D Velocity:       0.90                             Systolic Velocity:  48.20 cm/s LEFT ATRIUM             Index        RIGHT ATRIUM          Index LA diam:        3.40 cm 2.37 cm/m   RA Area:     6.82 cm LA Vol (A2C):   37.9 ml 26.39 ml/m  RA Volume:   10.50 ml 7.31 ml/m LA Vol (A4C):   43.1 ml 30.01 ml/m LA Biplane Vol: 44.2 ml 30.77 ml/m  AORTIC VALVE                    PULMONIC VALVE AV Area (Vmax):    3.01 cm     PV Vmax:       1.07 m/s AV Area (Vmean):   2.61 cm     PV Peak grad:  4.6 mmHg AV Area (VTI):     3.72 cm AV Vmax:           115.00 cm/s AV Vmean:          85.000 cm/s AV VTI:            0.189 m AV Peak Grad:      5.3 mmHg AV Mean Grad:      3.0 mmHg LVOT Vmax:         122.00 cm/s LVOT Vmean:        78.100 cm/s LVOT VTI:          0.248 m LVOT/AV VTI ratio: 1.31  AORTA Ao Root diam: 2.50 cm Ao Asc diam:  2.70 cm MITRAL VALVE                TRICUSPID VALVE MV Area (PHT): 5.58 cm     TR Peak grad:   20.6 mmHg MV Area VTI:   2.76 cm     TR Vmax:        227.00 cm/s MV Peak grad:  4.8 mmHg MV Mean grad:  3.0 mmHg     SHUNTS MV Vmax:       1.09 m/s     Systemic VTI:  0.25 m MV Vmean:      80.6 cm/s    Systemic Diam: 1.90 cm MV Decel Time: 136 msec MV E velocity: 89.10 cm/s MV A velocity: 121.00 cm/s MV E/A ratio:  0.74 Kardie Tobb DO Electronically signed by Dub Huntsman DO Signature Date/Time: 04/24/2024/2:38:49 PM    Final    CT ANGIO HEAD NECK W WO CM Result Date: 04/24/2024 CLINICAL DATA:   Stroke/TIA EXAM: CT ANGIOGRAPHY HEAD AND NECK WITH AND WITHOUT CONTRAST TECHNIQUE: Multidetector CT imaging of the head and neck was performed using the standard protocol during bolus administration of intravenous contrast. Multiplanar CT image reconstructions and MIPs were obtained to evaluate the vascular anatomy. Carotid stenosis measurements (when applicable) are obtained utilizing NASCET criteria, using the distal internal carotid diameter as the denominator. RADIATION DOSE REDUCTION: This exam was performed according to the departmental dose-optimization program which includes automated exposure control, adjustment of the mA and/or kV according to patient size and/or use of iterative reconstruction technique. CONTRAST:  75mL OMNIPAQUE  IOHEXOL  350 MG/ML SOLN COMPARISON:  Brain MRI April 23, 2024 FINDINGS: CT HEAD: There is low-attenuation in the cerebral white matter There is no hemorrhage. No acute ischemic changes. No mass lesion. The ventricles are normal. Skull/sinuses/orbits: No significant abnormality. CTA NECK: CTA NECK Aortic arch: No proximal vessel stenosis. Right carotid: There is a small amount of plaque with less than 50% stenosis Left carotid: Small amount of plaque with less than 50% stenosis Right vertebral: Normal Left vertebral: Normal Soft tissues: No significant abnormality Other comments: None CTA HEAD: CTA HEAD Right anterior circulation: The internal carotid artery is patent without significant stenosis. The anterior and middle cerebral arteries are patent without significant stenosis or proximal branch occlusion. No aneurysm. Left anterior circulation: The internal carotid artery is patent without significant stenosis. The anterior and middle cerebral arteries are patent without significant stenosis or proximal branch occlusion. No aneurysm. Posterior circulation: Both vertebral arteries are patent intracranially. No basilar stenosis. There is mild stenosis of the P1 segment of the  right posterior cerebral artery. IMPRESSION: 1. Bilateral carotid plaque with less than 50% stenosis 2. No significant vertebrobasilar or intracranial disease 3. Chronic white matter abnormalities. No acute abnormality noted on the CT. Electronically Signed   By: Nancyann Burns M.D.   On: 04/24/2024 09:43   MR BRAIN WO CONTRAST Result Date: 04/23/2024 EXAM: MRI BRAIN WITHOUT CONTRAST 04/23/2024 05:42:47 PM TECHNIQUE: Multiplanar multisequence MRI of the head/brain was performed without the administration of intravenous contrast. COMPARISON: Same day CT head and MRI head 09/23/2023. CLINICAL HISTORY: Vision loss, known etiology. FINDINGS: BRAIN AND VENTRICLES: There is a 4 mm focus of acute infarct within the right dorsal aspect of the midbrain partially involving the periaqueductal gray matter seen on series 2 image 22. No significant associated edema or mass effect. Remote lacunar infarcts are present in the bilateral basal ganglia and thalami, left greater than right, and in the right corona radiata. Volume loss in the left cerebral peduncle is likely related to prior infarcts. T2 and FLAIR hyperintensity in the periventricular and subcortical white matter with additional signal abnormality in the pons is suggestive of moderate chronic microvascular ischemic changes. Multiple scattered chronic microhemorrhages are present, particularly within the thalami and brainstem, likely related to hypertensive encephalopathy. Few additional smaller chronic microhemorrhages are seen in the cerebral hemispheres. There is mild generalized parenchymal volume loss. No mass. No midline shift. No hydrocephalus. The sella is unremarkable. Normal flow voids. ORBITS: Bilateral lens replacement. SINUSES AND MASTOIDS: Mucosal thickening in the left sphenoid sinus. BONES AND SOFT TISSUES: Normal marrow signal. Degenerative changes in the visualized upper cervical spine. Prominent signal abnormality at C4-C5 likely reflecting chronic  degenerative endplate changes. No acute soft tissue  abnormality. IMPRESSION: 1. Acute infarct in the right dorsal midbrain measuring 4 mm, partially involving the periaqueductal gray matter. No significant edema or mass effect. 2. Multiple scattered chronic microhemorrhages, particularly within the thalami and brainstem, likely related to hypertensive encephalopathy. 3. Moderate chronic microvascular ischemic changes, mild generalized parenchymal volume loss, remote lacunar infarcts as above. Electronically signed by: Donnice Mania MD 04/23/2024 06:01 PM EST RP Workstation: [id number]   Scheduled Meds:  aspirin  EC  81 mg Oral Daily   azaTHIOprine   50 mg Oral Daily   carvedilol   25 mg Oral BID WC   Chlorhexidine  Gluconate Cloth  6 each Topical Q0600   clopidogrel   75 mg Oral Daily   heparin  injection (subcutaneous)  5,000 Units Subcutaneous Q8H   hydrALAZINE   25 mg Oral BID   insulin  aspart  0-6 Units Subcutaneous TID WC   insulin  glargine  10 Units Subcutaneous Daily   isosorbide  mononitrate  30 mg Oral QHS   rosuvastatin   20 mg Oral Daily   Continuous Infusions:   LOS: 2 days    Time spent: 35 Mins    Darcel Dawley, MD [facility]   If 7PM-7AM, please contact night-coverage

## 2024-04-25 NOTE — Progress Notes (Signed)
 Pt is transferred out of the floor for HD. She is stable hemodynamically, afebrile. No acute distress noted.  Wendi Dash, RN

## 2024-04-25 NOTE — Plan of Care (Signed)
  Problem: Education: Goal: Knowledge of disease or condition will improve Outcome: Progressing Goal: Knowledge of secondary prevention will improve (MUST DOCUMENT ALL) Outcome: Progressing Goal: Knowledge of patient specific risk factors will improve (DELETE if not current risk factor) Outcome: Progressing   Problem: Ischemic Stroke/TIA Tissue Perfusion: Goal: Complications of ischemic stroke/TIA will be minimized Outcome: Progressing

## 2024-04-25 NOTE — Telephone Encounter (Signed)
 Pharmacy Patient Advocate Encounter  Insurance verification completed.    The patient is insured through [facility]. Patient has Medicare and is not eligible for a copay card, but may be able to apply for patient assistance or Medicare RX Payment Plan (Patient Must reach out to their plan, if eligible for payment plan), if available.    Ran test claim for Brand Brilinta  90mg  tablet and the current 30 day co-pay is $4.90.   This test claim was processed through [facility]- copay amounts may vary at other pharmacies due to pharmacy/plan contracts, or as the patient moves through the different stages of their insurance plan.

## 2024-04-25 NOTE — Consult Note (Signed)
 [facility] Renal Consultation Note    Indication for Consultation:  Management of ESRD/hemodialysis; anemia, hypertension/volume and secondary hyperparathyroidism   HPI: Margaree Sandhu is a 58 y.o. female with ESRD on HD, HTN, CAD, T2DM, MS, prior CVA with R hemiparesis who was admitted Monday with acute R midbrain stroke. Presented with vision changes, aphasia, and left facial droop. MRI showed acute infarct in the right dorsal midbrain measuring and multiple scattered chronic microhemorrhages, particularly within the thalami and brainstem, likely related to hypertensive encephalopathy.  Neurology consulted. Currently being evaluated for inpatient rehab.   Seen and examined in room. Daughter at bedside. Endorses continued left sided weakness, aphasic speech. Denies chest pain,  shortness of breath, nausea/vomiting.   Dialysis MWF at [facility]. Last dialysis Monday, did not get a full treatment. Using AVG, no issues reported with graft. Usually 1-2L UF goals.    Past Medical History:  Diagnosis Date   CAD S/P PCI x 2 2009   MI 2009 -> PCI x 2 ([place]); 01/2016 Nuc ST Nonischemic with Normal EF   Cerebral infarction (HCC) 07/12/2017   2009 first one, has had a total of 4 stokes- last 2021   Chronic kidney disease    Hemo M-W- F   Diabetes type 2, controlled (HCC)    Hemiparesis affecting right side as late effect of cerebrovascular accident (CVA) (HCC)    Hyperlipidemia    Hypertension    Intractable vomiting with nausea 07/12/2017   Multiple sclerosis 2009   Myocardial infarction [facility]) 2009   Stroke [facility])    Past Surgical History:  Procedure Laterality Date   A/V FISTULAGRAM Right 02/12/2021   Procedure: A/V FISTULAGRAM;  Surgeon: Gretta Lonni PARAS, MD;  Location: [facility] INVASIVE CV LAB;  Service: Cardiovascular;  Laterality: Right;   A/V FISTULAGRAM Right 05/20/2022   Procedure: A/V Fistulagram;  Surgeon: Gretta Lonni PARAS, MD;  Location: [facility] INVASIVE CV LAB;  Service:  Cardiovascular;  Laterality: Right;   A/V FISTULAGRAM N/A 05/05/2023   Procedure: A/V Fistulagram;  Surgeon: Melia Lynwood ORN, MD;  Location: [facility] INVASIVE CV LAB;  Service: Cardiovascular;  Laterality: N/A;   A/V SHUNT INTERVENTION N/A 08/16/2023   Procedure: A/V SHUNT INTERVENTION;  Surgeon: Tobie Gordy POUR, MD;  Location: [facility] INVASIVE CV LAB;  Service: Cardiovascular;  Laterality: N/A;   A/V SHUNTOGRAM Right 09/26/2023   Procedure: A/V SHUNTOGRAM;  Surgeon: Pearline Norman RAMAN, MD;  Location: [facility] INVASIVE CV LAB;  Service: Cardiovascular;  Laterality: Right;   APPENDECTOMY     AV FISTULA PLACEMENT Right 10/02/2020   Procedure: RIGHT ARTERIOVENOUS (AV) FISTULA CREATION;  Surgeon: Sheree Penne Lonni, MD;  Location: [facility] Inpatient OR;  Service: Vascular;  Laterality: Right;   AV FISTULA PLACEMENT Right 05/28/2022   Procedure: RIGHT ARM ARTERIOVENOUS (AV) FISTULA REVISION WITH GORETEX GRAFT;  Surgeon: Gretta Lonni PARAS, MD;  Location: [facility] OR;  Service: Vascular;  Laterality: Right;   BASCILIC VEIN TRANSPOSITION Right 11/24/2020   Procedure: SECOND STAGE RIGHT ARM BASCILIC VEIN TRANSPOSITION;  Surgeon: Gretta Lonni PARAS, MD;  Location: [facility] OR;  Service: Vascular;  Laterality: Right;   CYSTOSTOMY W/ BLADDER BIOPSY     INSERTION OF DIALYSIS CATHETER Right 05/28/2022   Procedure: INSERTION OF TUNNELED DIALYSIS CATHETER IN THE RIGHT INTERNAL JUGULAR;  Surgeon: Gretta Lonni PARAS, MD;  Location: [facility] OR;  Service: Vascular;  Laterality: Right;   IR FLUORO GUIDE CV LINE RIGHT  09/29/2020   IR US  GUIDE VASC ACCESS RIGHT  09/29/2020   NM MYOVIEW  LTD  12/25/2015  [place], [place] - NORMAL /NEGATIVE for ischemia. Normal LV Fxn   OVARIAN CYST SURGERY     PERCUTANEOUS CORONARY STENT INTERVENTION (PCI-S)  2009   [place], [place] - 2 stents in settng of MI -- unknown details   PERIPHERAL VASCULAR BALLOON ANGIOPLASTY Right 02/12/2021   Procedure: PERIPHERAL VASCULAR BALLOON ANGIOPLASTY;  Surgeon: Gretta Lonni PARAS, MD;   [facility]: [facility] CV LAB;  Service: Cardiovascular;  Laterality: Right;  UPPER ARM FISTULA   PERIPHERAL VASCULAR BALLOON ANGIOPLASTY  05/20/2022   Procedure: PERIPHERAL VASCULAR BALLOON ANGIOPLASTY;  Surgeon: Gretta Lonni PARAS, MD;  [facility]: [facility] CV [facility];  Service: Cardiovascular;;  rt arm fistula site   PERIPHERAL VASCULAR BALLOON ANGIOPLASTY Right 01/28/2022   Procedure: PERIPHERAL VASCULAR BALLOON ANGIOPLASTY;  Surgeon: Gretta Lonni PARAS, MD;  [facility]: [facility]VAS[facility] CV LAB;  Service: Cardiovascular;  Laterality: Right;  Arm fistula   PERIPHERAL VASCULAR BALLOON ANGIOPLASTY  05/05/2023   Procedure: PERIPHERAL VASCULAR BALLOON ANGIOPLASTY;  Surgeon: Melia Lynwood ORN, MD;  Location: [facility] INVASIVE CV LAB;  Service: Cardiovascular;;  RUA AVF   TRANSTHORACIC ECHOCARDIOGRAM  07/11/2017   a) 12/2015, [place], [place]): Moderate Conc LVH w/ sigmoid septum.  No LVOT obstruction.  Hyperdynamic LV fxn - EF 65 to 70%.  GR 1 DD & high LAP/LVEDP.  AoV Sclerosis - No AS;; b) 06/2017: [facility]): Normal LV Fxn - EF 60%.  Abnormal septal motion.  Mild LVH mild MAC.  Negative bubble study (for CVA)   TRANSTHORACIC ECHOCARDIOGRAM  12/2020   a) 05/2020: EF 60-65%. No RWMA. Gr2DD. Sm posterior pericardial effusion. No AS.; b) 09/2020: EF 70-75%.  Hyperdynamic.  No RWMA. ? D Fxn/LAP.  Mildly elevated PAP.  Pericardial effusion.  Mild ao V sclerosis cyst.; c) 12/2020 (Limited): EF 65-70%. No RWMA. Gr1-2 DD. Mild Conc LVH- high LAP. Nl PAP. Circumferential effusion w/o tamponade. AoV Sclerosis - no AS. Nl RAP.   ULTRASOUND GUIDANCE FOR VASCULAR ACCESS Right 05/28/2022   Procedure: ULTRASOUND GUIDANCE FOR VASCULAR ACCESS;  Surgeon: Gretta Lonni PARAS, MD;  Location: [facility];  Service: Vascular;  Laterality: Right;   VENOUS ANGIOPLASTY Right 08/16/2023   Procedure: VENOUS ANGIOPLASTY;  Surgeon: Tobie Gordy POUR, MD;  Location: [facility] INVASIVE CV LAB;  Service: Cardiovascular;  Laterality: Right;  70%  Innominate; 70% Outflow Basilic   History reviewed. No pertinent family history. Social History:  reports that she has never smoked. She has never used smokeless tobacco. She reports that she does not currently use alcohol. She reports that she does not use drugs. Allergies  Allergen Reactions   Ibuprofen Swelling    Swelling to hands, feet and face    Influenza Vaccines Anaphylaxis and Swelling    fever   Pneumococcal Vaccine Anaphylaxis and Swelling    fever   Promethazine Other (See Comments)    hallucinations   Ambien  [Zolpidem ] Other (See Comments)    Unknown reaction   Codeine Itching   Cyclobenzaprine Other (See Comments)    hallucinations    Hydrocodone -Acetaminophen  Itching    Takes now and has not had many issues.   Tape Itching   Tramadol  Hcl Other (See Comments)    hallucinations   Wound Dressing Adhesive Itching    Use paper tape   Oxycodone  Nausea And Vomiting   Prior to Admission medications   Medication Sig Start Date End Date Taking? Authorizing Provider  acetaminophen  (TYLENOL ) 500 MG tablet Take 500 mg by mouth every 6 (six) hours as needed for mild pain (pain score 1-3), moderate  pain (pain score 4-6), fever or headache.   Yes [provider]  amLODipine  (NORVASC ) 10 MG tablet Take 1 tablet (10 mg total) by mouth daily. Except on dialysis days do not take a dose. 06/08/22  Yes Lavona Agent, MD  azaTHIOprine  (IMURAN ) 50 MG tablet Take 50 mg by mouth daily. 04/26/23 04/25/24 Yes [provider]  calcium  acetate (PHOSLO ) 667 MG capsule Take 667 mg by mouth in the morning, at noon, and at bedtime. 11/11/20  Yes [provider]  carvedilol  (COREG ) 25 MG tablet Take 1 tablet (25 mg total) by mouth 2 (two) times daily with a meal. And as directed. 06/08/22  Yes Lavona Agent, MD  clopidogrel  (PLAVIX ) 75 MG tablet Take 1 tablet (75 mg total) by mouth daily. 11/03/20  Yes Love, Sharlet RAMAN, PA-C  diclofenac  Sodium (VOLTAREN ) 1 % GEL Apply 2 g  topically 4 (four) times daily. To leg Patient taking differently: Apply 2 g topically 4 (four) times daily as needed (pain). To leg 11/03/20  Yes Love, Sharlet RAMAN, PA-C  famotidine  (PEPCID ) 20 MG tablet Take 1 tablet (20 mg total) by mouth daily. 11/03/20  Yes Love, Sharlet RAMAN, PA-C  hydrALAZINE  (APRESOLINE ) 25 MG tablet Take 1 tablet (25 mg total) by mouth 2 (two) times daily. 12/20/23 04/23/24 Yes Anner Alm ORN, MD  HYDROcodone -acetaminophen  (NORCO/VICODIN) 5-325 MG tablet Take 1 tablet by mouth every 12 (twelve) hours as needed for moderate pain (pain score 4-6) or severe pain (pain score 7-10). 04/13/24  Yes [provider]  insulin  degludec (TRESIBA  FLEXTOUCH) 100 UNIT/ML FlexTouch Pen Inject 12 Units into the skin at bedtime. Patient taking differently: Inject 8-10 Units into the skin at bedtime. 11/03/20  Yes Love, Sharlet RAMAN, PA-C  insulin  lispro (HUMALOG ) 100 UNIT/ML KwikPen Inject 5 Units into the skin 3 (three) times daily. Patient taking differently: Inject 4-6 Units into the skin 3 (three) times daily. 11/04/20  Yes Love, Sharlet RAMAN, PA-C  isosorbide  mononitrate (IMDUR ) 30 MG 24 hr tablet Take 1 tablet (30 mg total) by mouth at bedtime. 12/20/23 04/23/24 Yes Anner Alm ORN, MD  lidocaine -prilocaine  (EMLA ) cream Apply 1 Application topically daily as needed (port access). 03/02/22  Yes [provider]  metoCLOPramide  (REGLAN ) 5 MG tablet Take 5 mg by mouth every 6 (six) hours as needed for nausea or vomiting. 09/16/21  Yes [provider]  naloxone (NARCAN) nasal spray 4 mg/0.1 mL Place 0.4 mg into the nose as needed.   Yes [provider]  nitroGLYCERIN  (NITROSTAT ) 0.4 MG SL tablet Place 1 tablet (0.4 mg total) under the tongue every 5 (five) minutes as needed for chest pain. 06/25/20  Yes Angiulli, Toribio PARAS, PA-C  ondansetron  (ZOFRAN ) 4 MG tablet Take 4 mg by mouth every 8 (eight) hours as needed for nausea or vomiting. 01/07/23  Yes [provider]   polyethylene glycol (MIRALAX  / GLYCOLAX ) 17 g packet Take 17 g by mouth daily. Patient taking differently: Take 17 g by mouth daily as needed for moderate constipation. 11/04/20  Yes Love, Sharlet RAMAN, PA-C  rosuvastatin  (CRESTOR ) 20 MG tablet Take 20 mg by mouth daily. 02/05/21  Yes [provider]  senna-docusate (SENOKOT-S) 8.6-50 MG tablet Take 1 tablet by mouth 2 (two) times daily as needed for moderate constipation. Patient taking differently: Take 1 tablet by mouth 2 (two) times daily as needed for mild constipation or moderate constipation. 11/03/20  Yes Love, Sharlet RAMAN, PA-C  Continuous Blood Gluc Sensor (FREESTYLE LIBRE 2 SENSOR) MISC USE  AS DIRECTED CHANGE EVERY 14 DAYS 12/30/20   [provider]   Current Facility-Administered Medications  Medication Dose Route Frequency Provider Last Rate Last Admin   aspirin  EC tablet 81 mg  81 mg Oral Daily Leotis Bogus, MD   81 mg at 04/25/24 0849   azaTHIOprine  (IMURAN ) tablet 50 mg  50 mg Oral Daily Dorrell, Lamar, MD   50 mg at 04/25/24 0848   carvedilol  (COREG ) tablet 25 mg  25 mg Oral BID WC Dena Lamar, MD   25 mg at 04/25/24 0849   Chlorhexidine  Gluconate Cloth 2 % PADS 6 each  6 each Topical Q0600 Leotis Bogus, MD   6 each at 04/25/24 0850   clopidogrel  (PLAVIX ) tablet 75 mg  75 mg Oral Daily Dorrell, Robert, MD   75 mg at 04/25/24 0848   heparin  injection 5,000 Units  5,000 Units Subcutaneous Q8H Dorrell, Robert, MD   5,000 Units at 04/25/24 9352   hydrALAZINE  (APRESOLINE ) tablet 25 mg  25 mg Oral BID Dena Lamar, MD   25 mg at 04/25/24 0849   insulin  aspart (novoLOG ) injection 0-6 Units  0-6 Units Subcutaneous TID WC Leotis Bogus, MD   2 Units at 04/25/24 1217   insulin  glargine (LANTUS ) injection 10 Units  10 Units Subcutaneous Daily Leotis Bogus, MD   10 Units at 04/24/24 1244   isosorbide  mononitrate (IMDUR ) 24 hr tablet 30 mg  30 mg Oral QHS Dorrell, Robert, MD   30 mg at 04/24/24 2124   melatonin  tablet 3 mg  3 mg Oral QHS PRN Rathore, Vasundhra, MD   3 mg at 04/25/24 0013   Oral care mouth rinse  15 mL Mouth Rinse PRN Leotis Bogus, MD       rosuvastatin  (CRESTOR ) tablet 20 mg  20 mg Oral Daily Dorrell, Robert, MD   20 mg at 04/25/24 0849     ROS: As per HPI otherwise negative.  Physical Exam: Vitals:   04/24/24 2258 04/25/24 0359 04/25/24 0734 04/25/24 1139  BP: (!) 159/69 (!) 162/69 (!) 158/71 (!) 180/73  Pulse: 84 84 83 87  Resp: 18 18    Temp: 98.3 F (36.8 C) 98.4 F (36.9 C) 98.2 F (36.8 C) 98.5 F (36.9 C)  TempSrc: Oral  Oral Oral  SpO2: 99% 98% 100% 99%  Weight:         General: Chronically ill appearing, nad Head: L facial droop, EOMI  CV: RRR, + murmur  Pulm: normal respiratory effort, lungs clear  Abdomen: non-tender, no masses  Lower extremities: no edema or open wounds  Neuro: A & O X 3. R hemiparesis, aphasia  Psych: Normal affect  Dialysis Access: RUE AVG +bruit   Labs: Basic Metabolic Panel: Recent Labs  Lab 04/23/24 1344  NA 137  K 3.4*  CL 97*  CO2 29  GLUCOSE 184*  BUN 9  CREATININE 3.51*  CALCIUM  8.0*   Liver Function Tests: Recent Labs  Lab 04/23/24 1344  AST 15  ALT 8  ALKPHOS 48  BILITOT 0.9  PROT 7.2  ALBUMIN 3.3*   No results for input(s): LIPASE, AMYLASE in the last 168 hours. No results for input(s): AMMONIA in the last 168 hours. CBC: Recent Labs  Lab 04/23/24 1344  WBC 3.0*  NEUTROABS 1.6*  HGB 11.8*  HCT 37.8  MCV 96.7  PLT 163   Cardiac Enzymes: No results for input(s): CKTOTAL, CKMB, CKMBINDEX, TROPONINI in the last 168 hours. CBG: Recent Labs  Lab 04/24/24 1240 04/24/24 1647  04/24/24 2108 04/25/24 0607 04/25/24 1117  GLUCAP 303* 255* 171* 95 250*   Iron Studies: No results for input(s): IRON, TIBC, TRANSFERRIN, FERRITIN in the last 72 hours. Studies/Results: ECHOCARDIOGRAM COMPLETE Result Date: 04/24/2024    ECHOCARDIOGRAM REPORT   Patient Name:   JEZABELLA SCHRIEVER Date  of Exam: 04/24/2024 Medical Rec #:  [id number]    Height:       62.0 in Accession #:    [id number]   Weight:       102.1 lb Date of Birth:  Apr 15, 1966   BSA:          1.436 m Patient Age:    58 years     BP:           190/74 mmHg Patient Gender: F            HR:           85 bpm. Exam Location:  Inpatient Procedure: 2D Echo (Both Spectral and Color Flow Doppler were utilized during            procedure). Indications:    Stroke  History:        Patient has prior history of Echocardiogram examinations.                 Stroke.  Sonographer:    Norleen Amour Referring Phys: [id number] ROBERT DORRELL IMPRESSIONS  1. Left ventricular ejection fraction, by estimation, is 65 to 70%. The left ventricle has normal function. The left ventricle has no regional wall motion abnormalities. There is mild concentric left ventricular hypertrophy. Left ventricular diastolic parameters were normal.  2. Right ventricular systolic function is normal. The right ventricular size is normal. There is normal pulmonary artery systolic pressure.  3. A small pericardial effusion is present. The pericardial effusion is circumferential. There is no evidence of cardiac tamponade.  4. The mitral valve is normal in structure. No evidence of mitral valve regurgitation. No evidence of mitral stenosis.  5. The aortic valve is normal in structure. Aortic valve regurgitation is mild to moderate. No aortic stenosis is present.  6. Abdominal aorta is normal sized.  7. The inferior vena cava is normal in size with greater than 50% respiratory variability, suggesting right atrial pressure of 3 mmHg. FINDINGS  Left Ventricle: Left ventricular ejection fraction, by estimation, is 65 to 70%. The left ventricle has normal function. The left ventricle has no regional wall motion abnormalities. The left ventricular internal cavity size was normal in size. There is  mild concentric left ventricular hypertrophy. Left ventricular diastolic parameters were normal. Right  Ventricle: The right ventricular size is normal. No increase in right ventricular wall thickness. Right ventricular systolic function is normal. There is normal pulmonary artery systolic pressure. The tricuspid regurgitant velocity is 2.27 m/s, and  with an assumed right atrial pressure of 3 mmHg, the estimated right ventricular systolic pressure is 23.6 mmHg. Left Atrium: Left atrial size was normal in size. Right Atrium: Right atrial size was normal in size. Pericardium: A small pericardial effusion is present. The pericardial effusion is circumferential. There is no evidence of cardiac tamponade. Mitral Valve: The mitral valve is normal in structure. No evidence of mitral valve regurgitation. No evidence of mitral valve stenosis. MV peak gradient, 4.8 mmHg. The mean mitral valve gradient is 3.0 mmHg. Tricuspid Valve: The tricuspid valve is normal in structure. Tricuspid valve regurgitation is not demonstrated. No evidence of tricuspid stenosis. Aortic Valve: The aortic valve is normal in structure. Aortic valve  regurgitation is mild to moderate. No aortic stenosis is present. Aortic valve mean gradient measures 3.0 mmHg. Aortic valve peak gradient measures 5.3 mmHg. Aortic valve area, by VTI measures 3.72 cm. Pulmonic Valve: The pulmonic valve was normal in structure. Pulmonic valve regurgitation is not visualized. No evidence of pulmonic stenosis. Aorta: Abdominal aorta is normal sized. The aortic root is normal in size and structure. Venous: The inferior vena cava is normal in size with greater than 50% respiratory variability, suggesting right atrial pressure of 3 mmHg. IAS/Shunts: No atrial level shunt detected by color flow Doppler.  LEFT VENTRICLE PLAX 2D LVIDd:         3.70 cm     Diastology LVIDs:         1.90 cm     LV e' medial:    6.64 cm/s LV PW:         1.20 cm     LV E/e' medial:  13.4 LV IVS:        1.30 cm     LV e' lateral:   7.62 cm/s LVOT diam:     1.90 cm     LV E/e' lateral: 11.7 LV SV:          70 LV SV Index:   49 LVOT Area:     2.84 cm  LV Volumes (MOD) LV vol d, MOD A2C: 59.4 ml LV vol d, MOD A4C: 67.3 ml LV vol s, MOD A2C: 15.2 ml LV vol s, MOD A4C: 19.2 ml LV SV MOD A2C:     44.2 ml LV SV MOD A4C:     67.3 ml LV SV MOD BP:      47.7 ml RIGHT VENTRICLE             IVC RV Basal diam:  2.40 cm     IVC diam: 1.10 cm RV S prime:     15.70 cm/s TAPSE (M-mode): 2.2 cm      PULMONARY VEINS                             Diastolic Velocity: 51.40 cm/s                             S/D Velocity:       0.90                             Systolic Velocity:  48.20 cm/s LEFT ATRIUM             Index        RIGHT ATRIUM          Index LA diam:        3.40 cm 2.37 cm/m   RA Area:     6.82 cm LA Vol (A2C):   37.9 ml 26.39 ml/m  RA Volume:   10.50 ml 7.31 ml/m LA Vol (A4C):   43.1 ml 30.01 ml/m LA Biplane Vol: 44.2 ml 30.77 ml/m  AORTIC VALVE                    PULMONIC VALVE AV Area (Vmax):    3.01 cm     PV Vmax:       1.07 m/s AV Area (Vmean):   2.61 cm     PV Peak grad:  4.6 mmHg AV Area (VTI):  3.72 cm AV Vmax:           115.00 cm/s AV Vmean:          85.000 cm/s AV VTI:            0.189 m AV Peak Grad:      5.3 mmHg AV Mean Grad:      3.0 mmHg LVOT Vmax:         122.00 cm/s LVOT Vmean:        78.100 cm/s LVOT VTI:          0.248 m LVOT/AV VTI ratio: 1.31  AORTA Ao Root diam: 2.50 cm Ao Asc diam:  2.70 cm MITRAL VALVE                TRICUSPID VALVE MV Area (PHT): 5.58 cm     TR Peak grad:   20.6 mmHg MV Area VTI:   2.76 cm     TR Vmax:        227.00 cm/s MV Peak grad:  4.8 mmHg MV Mean grad:  3.0 mmHg     SHUNTS MV Vmax:       1.09 m/s     Systemic VTI:  0.25 m MV Vmean:      80.6 cm/s    Systemic Diam: 1.90 cm MV Decel Time: 136 msec MV E velocity: 89.10 cm/s MV A velocity: 121.00 cm/s MV E/A ratio:  0.74 Kardie Tobb DO Electronically signed by Dub Huntsman DO Signature Date/Time: 04/24/2024/2:38:49 PM    Final    CT ANGIO HEAD NECK W WO CM Result Date: 04/24/2024 CLINICAL DATA:  Stroke/TIA EXAM: CT  ANGIOGRAPHY HEAD AND NECK WITH AND WITHOUT CONTRAST TECHNIQUE: Multidetector CT imaging of the head and neck was performed using the standard protocol during bolus administration of intravenous contrast. Multiplanar CT image reconstructions and MIPs were obtained to evaluate the vascular anatomy. Carotid stenosis measurements (when applicable) are obtained utilizing NASCET criteria, using the distal internal carotid diameter as the denominator. RADIATION DOSE REDUCTION: This exam was performed according to the departmental dose-optimization program which includes automated exposure control, adjustment of the mA and/or kV according to patient size and/or use of iterative reconstruction technique. CONTRAST:  75mL OMNIPAQUE  IOHEXOL  350 MG/ML SOLN COMPARISON:  Brain MRI April 23, 2024 FINDINGS: CT HEAD: There is low-attenuation in the cerebral white matter There is no hemorrhage. No acute ischemic changes. No mass lesion. The ventricles are normal. Skull/sinuses/orbits: No significant abnormality. CTA NECK: CTA NECK Aortic arch: No proximal vessel stenosis. Right carotid: There is a small amount of plaque with less than 50% stenosis Left carotid: Small amount of plaque with less than 50% stenosis Right vertebral: Normal Left vertebral: Normal Soft tissues: No significant abnormality Other comments: None CTA HEAD: CTA HEAD Right anterior circulation: The internal carotid artery is patent without significant stenosis. The anterior and middle cerebral arteries are patent without significant stenosis or proximal branch occlusion. No aneurysm. Left anterior circulation: The internal carotid artery is patent without significant stenosis. The anterior and middle cerebral arteries are patent without significant stenosis or proximal branch occlusion. No aneurysm. Posterior circulation: Both vertebral arteries are patent intracranially. No basilar stenosis. There is mild stenosis of the P1 segment of the right posterior  cerebral artery. IMPRESSION: 1. Bilateral carotid plaque with less than 50% stenosis 2. No significant vertebrobasilar or intracranial disease 3. Chronic white matter abnormalities. No acute abnormality noted on the CT. Electronically Signed   By: Nancyann Lester HERO.D.  On: 04/24/2024 09:43   MR BRAIN WO CONTRAST Result Date: 04/23/2024 EXAM: MRI BRAIN WITHOUT CONTRAST 04/23/2024 05:42:47 PM TECHNIQUE: Multiplanar multisequence MRI of the head/brain was performed without the administration of intravenous contrast. COMPARISON: Same day CT head and MRI head 09/23/2023. CLINICAL HISTORY: Vision loss, known etiology. FINDINGS: BRAIN AND VENTRICLES: There is a 4 mm focus of acute infarct within the right dorsal aspect of the midbrain partially involving the periaqueductal gray matter seen on series 2 image 22. No significant associated edema or mass effect. Remote lacunar infarcts are present in the bilateral basal ganglia and thalami, left greater than right, and in the right corona radiata. Volume loss in the left cerebral peduncle is likely related to prior infarcts. T2 and FLAIR hyperintensity in the periventricular and subcortical white matter with additional signal abnormality in the pons is suggestive of moderate chronic microvascular ischemic changes. Multiple scattered chronic microhemorrhages are present, particularly within the thalami and brainstem, likely related to hypertensive encephalopathy. Few additional smaller chronic microhemorrhages are seen in the cerebral hemispheres. There is mild generalized parenchymal volume loss. No mass. No midline shift. No hydrocephalus. The sella is unremarkable. Normal flow voids. ORBITS: Bilateral lens replacement. SINUSES AND MASTOIDS: Mucosal thickening in the left sphenoid sinus. BONES AND SOFT TISSUES: Normal marrow signal. Degenerative changes in the visualized upper cervical spine. Prominent signal abnormality at C4-C5 likely reflecting chronic degenerative  endplate changes. No acute soft tissue abnormality. IMPRESSION: 1. Acute infarct in the right dorsal midbrain measuring 4 mm, partially involving the periaqueductal gray matter. No significant edema or mass effect. 2. Multiple scattered chronic microhemorrhages, particularly within the thalami and brainstem, likely related to hypertensive encephalopathy. 3. Moderate chronic microvascular ischemic changes, mild generalized parenchymal volume loss, remote lacunar infarcts as above. Electronically signed by: Donnice Mania MD 04/23/2024 06:01 PM EST RP Workstation: [id number]   CT HEAD WO CONTRAST Result Date: 04/23/2024 EXAM: CT HEAD WITHOUT CONTRAST 04/23/2024 02:19:26 PM TECHNIQUE: CT of the head was performed without the administration of intravenous contrast. Automated exposure control, iterative reconstruction, and/or weight based adjustment of the mA/kV was utilized to reduce the radiation dose to as low as reasonably achievable. COMPARISON: 09/23/2023 CLINICAL HISTORY: Neuro deficit, acute, stroke suspected. FINDINGS: BRAIN AND VENTRICLES: No acute hemorrhage. No evidence of acute infarct. Patchy and confluent decreased attenuation throughout deep and periventricular white matter of cerebral hemispheres bilaterally, compatible with chronic microvascular ischemic disease. Chronic right corona radiata, left basal ganglia lacunar infarcts. No hydrocephalus. No extra-axial collection. No mass effect or midline shift. ORBITS: Bilateral lens replacement. SINUSES: Left sphenoid sinus mucosal thickening. SOFT TISSUES AND SKULL: No acute soft tissue abnormality. No skull fracture. VASCULATURE: Atherosclerotic calcifications within cavernous internal carotid arteries. IMPRESSION: 1. No acute intracranial abnormality. 2. Moderate chronic microvascular ischemic disease. Chronic right corona radiata and left basal ganglia lacunar infarcts. Electronically signed by: Donnice Mania MD 04/23/2024 02:41 PM EST RP Workstation:  [id number]    Dialysis Orders:  AF MWF 3:30 BFR 350 2K/2Ca AVG 16g No heparin   -No ESA  -Hectorol  1 mcg q HD  Assessment/Plan:  Acute CVA. H/o prior strokes. 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 Management per neuro. Being evaluated for CIR.   ESRD.  HD MWF. Continue per schedule. HD today.   Hypertension. Home meds resumed. Remains hypertensive.  Volume. Not grossly overloaded. UF 1-2L today  Anemia. Hgb 11.8. No ESA needs.  Metabolic bone disease.  Follow Ca/Phos here. Continue Hectorol , Phoslo  binder  T2DM. Insulin  per primary   Maisie Fellows  Dalayna Lauter PA-C [employer] 04/25/2024, 1:56 PM

## 2024-04-25 NOTE — Consult Note (Signed)
 Physical Medicine and Rehabilitation Consult Reason for Consult: CVA Referring Physician: Darcel Dawley, MD   HPI: Hannah [name] is a 58 y.o. female who presented on 12/8 with visual changes and aphasia. MRI showed a R dorsal midbrain CVA. PMH includes CAD, CVA with residual right sided weakness, ESRD, HTN, HLD, MI, MS. Physical Medicine & Rehabilitation was consulted to assess candidacy for CIR.  She is currently ambulating Mod A 2 feet with the RW and transferring with Min to ModA with the RW. She is Min to Mod A for bed mobility.    Home: Home Living Family/patient expects to be discharged to:: Private residence Living Arrangements: Children Available Help at Discharge: Family, Available 24 hours/day Type of Home: Apartment Home Access: Stairs to enter [employer] of Steps: threshold Entrance Stairs-Rails: None Home Layout: One level Bathroom Shower/Tub: Engineer, Manufacturing Systems: Standard Home Equipment: Tub bench, Agricultural Consultant (2 wheels), BSC/3in1, Wheelchair - manual, Cane - single point  Functional History: Prior Function Prior Level of Function : Needs assist Mobility Comments: son or daughter assist the pt in stand pivot transfers to her manual wheelchair. Pt's MWC does not fit in the bathroom so she walks with min assistance for ~5-10' to reach the commode or tub ADLs Comments: family assisting with bathing and dressing but daughter feels like pt has potential to be able to do more. family assists with IADLs Functional Status:  Mobility: Bed Mobility Overal bed mobility: Needs Assistance Bed Mobility: Rolling, Sidelying to Sit, Sit to Sidelying Rolling: Min assist Sidelying to sit: Mod assist, HOB elevated Sit to sidelying: Mod assist Transfers Overall transfer level: Needs assistance Equipment used: 1 person hand held assist, Rolling walker (2 wheels) Transfers: Sit to/from Stand, Bed to chair/wheelchair/BSC Sit to Stand: Min assist, From  elevated surface Bed to/from chair/wheelchair/BSC transfer type:: Stand pivot Stand pivot transfers: Mod assist General transfer comment: assist via gait belt from PT, pt requries facilitation of pivot at hips by PT during stand pivot transfer to/from wheelchair. Ambulation/Gait Ambulation/Gait assistance: Mod assist Gait Distance (Feet): 2 Feet Assistive device: Rolling walker (2 wheels) Gait Pattern/deviations: Step-to pattern, Decreased step length - right, Decreased step length - left, Decreased stance time - right, Decreased stance time - left, Decreased dorsiflexion - right, Decreased dorsiflexion - left General Gait Details: pt with slowed step-to gait, reduced foot clearance bilaterally and increased time in dual support stance phase. Posterior lean prevalent which PT provides modA to correct constantly throughout gait trial. Distance limited due to MD arrival Gait velocity: reduced Gait velocity interpretation: <1.31 ft/sec, indicative of household ambulator    ADL:    Cognition: Cognition Orientation Level: Oriented X4 Cognition Arousal: Alert Behavior During Therapy: WFL for tasks assessed/performed   ROS +R>L sided wekaness Past Medical History:  Diagnosis Date   CAD S/P PCI x 2 2009   MI 2009 -> PCI x 2 ([place]); 01/2016 Nuc ST Nonischemic with Normal EF   Cerebral infarction (HCC) 07/12/2017   2009 first one, has had a total of 4 stokes- last 2021   Chronic kidney disease    Hemo M-W- F   Diabetes type 2, controlled (HCC)    Hemiparesis affecting right side as late effect of cerebrovascular accident (CVA) (HCC)    Hyperlipidemia    Hypertension    Intractable vomiting with nausea 07/12/2017   Multiple sclerosis 2009   Myocardial infarction [facility]) 2009   Stroke [facility])    Past Surgical History:  Procedure Laterality Date  A/V FISTULAGRAM Right 02/12/2021   Procedure: A/V FISTULAGRAM;  Surgeon: Gretta Lonni PARAS, MD;  Location: [facility] INVASIVE CV LAB;   Service: Cardiovascular;  Laterality: Right;   A/V FISTULAGRAM Right 05/20/2022   Procedure: A/V Fistulagram;  Surgeon: Gretta Lonni PARAS, MD;  Location: [facility] INVASIVE CV LAB;  Service: Cardiovascular;  Laterality: Right;   A/V FISTULAGRAM N/A 05/05/2023   Procedure: A/V Fistulagram;  Surgeon: Melia Lynwood ORN, MD;  Location: [facility] INVASIVE CV LAB;  Service: Cardiovascular;  Laterality: N/A;   A/V SHUNT INTERVENTION N/A 08/16/2023   Procedure: A/V SHUNT INTERVENTION;  Surgeon: Tobie Gordy POUR, MD;  Location: [facility] INVASIVE CV LAB;  Service: Cardiovascular;  Laterality: N/A;   A/V SHUNTOGRAM Right 09/26/2023   Procedure: A/V SHUNTOGRAM;  Surgeon: Pearline Norman RAMAN, MD;  Location: [facility] INVASIVE CV LAB;  Service: Cardiovascular;  Laterality: Right;   APPENDECTOMY     AV FISTULA PLACEMENT Right 10/02/2020   Procedure: RIGHT ARTERIOVENOUS (AV) FISTULA CREATION;  Surgeon: Sheree Penne Lonni, MD;  Location: [facility] OR;  Service: Vascular;  Laterality: Right;   AV FISTULA PLACEMENT Right 05/28/2022   Procedure: RIGHT ARM ARTERIOVENOUS (AV) FISTULA REVISION WITH GORETEX GRAFT;  Surgeon: Gretta Lonni PARAS, MD;  Location: [facility] OR;  Service: Vascular;  Laterality: Right;   BASCILIC VEIN TRANSPOSITION Right 11/24/2020   Procedure: SECOND STAGE RIGHT ARM BASCILIC VEIN TRANSPOSITION;  Surgeon: Gretta Lonni PARAS, MD;  Location: [facility] OR;  Service: Vascular;  Laterality: Right;   CYSTOSTOMY W/ BLADDER BIOPSY     INSERTION OF DIALYSIS CATHETER Right 05/28/2022   Procedure: INSERTION OF TUNNELED DIALYSIS CATHETER IN THE RIGHT INTERNAL JUGULAR;  Surgeon: Gretta Lonni PARAS, MD;  Location: [facility] OR;  Service: Vascular;  Laterality: Right;   IR FLUORO GUIDE CV LINE RIGHT  09/29/2020   IR US  GUIDE VASC ACCESS RIGHT  09/29/2020   NM MYOVIEW  LTD  12/25/2015   [place], [place] - NORMAL /NEGATIVE for ischemia. Normal LV Fxn   OVARIAN CYST SURGERY     PERCUTANEOUS CORONARY STENT INTERVENTION (PCI-S)  2009   [place], [place] - 2 stents in  settng of MI -- unknown details   PERIPHERAL VASCULAR BALLOON ANGIOPLASTY Right 02/12/2021   Procedure: PERIPHERAL VASCULAR BALLOON ANGIOPLASTY;  Surgeon: Gretta Lonni PARAS, MD;  Location: [facility] INVASIVE CV LAB;  Service: Cardiovascular;  Laterality: Right;  UPPER ARM FISTULA   PERIPHERAL VASCULAR BALLOON ANGIOPLASTY  05/20/2022   Procedure: PERIPHERAL VASCULAR BALLOON ANGIOPLASTY;  Surgeon: Gretta Lonni PARAS, MD;  Location: [facility] INVASIVE CV LAB;  Service: Cardiovascular;;  rt arm fistula site   PERIPHERAL VASCULAR BALLOON ANGIOPLASTY Right 01/28/2022   Procedure: PERIPHERAL VASCULAR BALLOON ANGIOPLASTY;  Surgeon: Gretta Lonni PARAS, MD;  Location: [facility] INVASIVE CV LAB;  Service: Cardiovascular;  Laterality: Right;  Arm fistula   PERIPHERAL VASCULAR BALLOON ANGIOPLASTY  05/05/2023   Procedure: PERIPHERAL VASCULAR BALLOON ANGIOPLASTY;  Surgeon: Melia Lynwood ORN, MD;  Location: [facility] INVASIVE CV LAB;  Service: Cardiovascular;;  RUA AVF   TRANSTHORACIC ECHOCARDIOGRAM  07/11/2017   a) 12/2015, [place], [place]): Moderate Conc LVH w/ sigmoid septum.  No LVOT obstruction.  Hyperdynamic LV fxn - EF 65 to 70%.  GR 1 DD & high LAP/LVEDP.  AoV Sclerosis - No AS;; b) 06/2017: [facility]): Normal LV Fxn - EF 60%.  Abnormal septal motion.  Mild LVH mild MAC.  Negative bubble study (for CVA)   TRANSTHORACIC ECHOCARDIOGRAM  12/2020   a) 05/2020: EF 60-65%. No RWMA. Gr2DD. Sm posterior pericardial effusion. No AS.; b) 09/2020: EF  70-75%.  Hyperdynamic.  No RWMA. ? D Fxn/LAP.  Mildly elevated PAP.  Pericardial effusion.  Mild ao V sclerosis cyst.; c) 12/2020 (Limited): EF 65-70%. No RWMA. Gr1-2 DD. Mild Conc LVH- high LAP. Nl PAP. Circumferential effusion w/o tamponade. AoV Sclerosis - no AS. Nl RAP.   ULTRASOUND GUIDANCE FOR VASCULAR ACCESS Right 05/28/2022   Procedure: ULTRASOUND GUIDANCE FOR VASCULAR ACCESS;  Surgeon: Gretta Lonni PARAS, MD;  Location: [facility] OR;  Service: Vascular;  Laterality: Right;    VENOUS ANGIOPLASTY Right 08/16/2023   Procedure: VENOUS ANGIOPLASTY;  Surgeon: Tobie Gordy POUR, MD;  Location: [facility] INVASIVE CV LAB;  Service: Cardiovascular;  Laterality: Right;  70% Innominate; 70% Outflow Basilic   History reviewed. No pertinent family history. Social History:  reports that she has never smoked. She has never used smokeless tobacco. She reports that she does not currently use alcohol. She reports that she does not use drugs. Allergies:  Allergies  Allergen Reactions   Ibuprofen Swelling    Swelling to hands, feet and face    Influenza Vaccines Anaphylaxis and Swelling    fever   Pneumococcal Vaccine Anaphylaxis and Swelling    fever   Promethazine Other (See Comments)    hallucinations   Ambien  [Zolpidem ] Other (See Comments)    Unknown reaction   Codeine Itching   Cyclobenzaprine Other (See Comments)    hallucinations    Hydrocodone -Acetaminophen  Itching    Takes now and has not had many issues.   Tape Itching   Tramadol  Hcl Other (See Comments)    hallucinations   Wound Dressing Adhesive Itching    Use paper tape   Oxycodone  Nausea And Vomiting   Medications Prior to Admission  Medication Sig Dispense Refill   acetaminophen  (TYLENOL ) 500 MG tablet Take 500 mg by mouth every 6 (six) hours as needed for mild pain (pain score 1-3), moderate pain (pain score 4-6), fever or headache.     amLODipine  (NORVASC ) 10 MG tablet Take 1 tablet (10 mg total) by mouth daily. Except on dialysis days do not take a dose. 30 tablet 0   azaTHIOprine  (IMURAN ) 50 MG tablet Take 50 mg by mouth daily.     calcium  acetate (PHOSLO ) 667 MG capsule Take 667 mg by mouth in the morning, at noon, and at bedtime.     carvedilol  (COREG ) 25 MG tablet Take 1 tablet (25 mg total) by mouth 2 (two) times daily with a meal. And as directed. 60 tablet 0   clopidogrel  (PLAVIX ) 75 MG tablet Take 1 tablet (75 mg total) by mouth daily. 30 tablet 0   diclofenac  Sodium (VOLTAREN ) 1 % GEL Apply 2 g  topically 4 (four) times daily. To leg (Patient taking differently: Apply 2 g topically 4 (four) times daily as needed (pain). To leg) 350 g 0   famotidine  (PEPCID ) 20 MG tablet Take 1 tablet (20 mg total) by mouth daily. 30 tablet 0   hydrALAZINE  (APRESOLINE ) 25 MG tablet Take 1 tablet (25 mg total) by mouth 2 (two) times daily. 180 tablet 3   HYDROcodone -acetaminophen  (NORCO/VICODIN) 5-325 MG tablet Take 1 tablet by mouth every 12 (twelve) hours as needed for moderate pain (pain score 4-6) or severe pain (pain score 7-10).     insulin  degludec (TRESIBA  FLEXTOUCH) 100 UNIT/ML FlexTouch Pen Inject 12 Units into the skin at bedtime. (Patient taking differently: Inject 8-10 Units into the skin at bedtime.) 15 mL 0   insulin  lispro (HUMALOG ) 100 UNIT/ML KwikPen Inject 5 Units into the skin  3 (three) times daily. (Patient taking differently: Inject 4-6 Units into the skin 3 (three) times daily.) 15 mL 0   isosorbide  mononitrate (IMDUR ) 30 MG 24 hr tablet Take 1 tablet (30 mg total) by mouth at bedtime. 90 tablet 3   lidocaine -prilocaine  (EMLA ) cream Apply 1 Application topically daily as needed (port access).     metoCLOPramide  (REGLAN ) 5 MG tablet Take 5 mg by mouth every 6 (six) hours as needed for nausea or vomiting.     naloxone (NARCAN) nasal spray 4 mg/0.1 mL Place 0.4 mg into the nose as needed.     nitroGLYCERIN  (NITROSTAT ) 0.4 MG SL tablet Place 1 tablet (0.4 mg total) under the tongue every 5 (five) minutes as needed for chest pain. 30 tablet 12   ondansetron  (ZOFRAN ) 4 MG tablet Take 4 mg by mouth every 8 (eight) hours as needed for nausea or vomiting.     polyethylene glycol (MIRALAX  / GLYCOLAX ) 17 g packet Take 17 g by mouth daily. (Patient taking differently: Take 17 g by mouth daily as needed for moderate constipation.) 30 each 0   rosuvastatin  (CRESTOR ) 20 MG tablet Take 20 mg by mouth daily.     senna-docusate (SENOKOT-S) 8.6-50 MG tablet Take 1 tablet by mouth 2 (two) times daily as  needed for moderate constipation. (Patient taking differently: Take 1 tablet by mouth 2 (two) times daily as needed for mild constipation or moderate constipation.) 60 tablet 0   Continuous Blood Gluc Sensor (FREESTYLE LIBRE 2 SENSOR) MISC USE AS DIRECTED CHANGE EVERY 14 DAYS       Blood pressure (!) 180/73, pulse 87, temperature 98.5 F (36.9 C), temperature source Oral, resp. rate 18, weight 46.3 kg, SpO2 99%. Physical Exam Gen: no distress, normal appearing HEENT: oral mucosa pink and moist, NCAT Cardio: Reg rate Chest: normal effort, normal rate of breathing Abd: soft, non-distended Ext: no edema Psych: pleasant, normal affect Skin: intact Neuro: Alert and oriented. 3/5 strength in RUE and 4/5 in LUE, 3/5 strength in RLE and 4/5 in LLE, sensation is intact, has double vision and decreased peripheral vision  Results for orders placed or performed during the hospital encounter of 04/23/24 (from the past 24 hours)  CBG monitoring, ED     Status: Abnormal   Collection Time: 04/24/24  4:47 PM  Result Value Ref Range   Glucose-Capillary 255 (H) 70 - 99 mg/dL  Platelet inhibition [id number] (not at [facility])     Status: None   Collection Time: 04/24/24  6:05 PM  Result Value Ref Range   Platelet Function  P2Y12 229 182 - 335 PRU  Glucose, capillary     Status: Abnormal   Collection Time: 04/24/24  9:08 PM  Result Value Ref Range   Glucose-Capillary 171 (H) 70 - 99 mg/dL   Comment 1 Notify RN    Comment 2 Document in Chart   Glucose, capillary     Status: None   Collection Time: 04/25/24  6:07 AM  Result Value Ref Range   Glucose-Capillary 95 70 - 99 mg/dL   Comment 1 Notify RN    Comment 2 Document in Chart   Glucose, capillary     Status: Abnormal   Collection Time: 04/25/24 11:17 AM  Result Value Ref Range   Glucose-Capillary 250 (H) 70 - 99 mg/dL   ECHOCARDIOGRAM COMPLETE Result Date: 04/24/2024    ECHOCARDIOGRAM REPORT   Patient Name:   KALYNA PAOLELLA Date of Exam: 04/24/2024  Medical Rec #:  [id number]  Height:       62.0 in Accession #:    [id number]   Weight:       102.1 lb Date of Birth:  Jun 06, 1965   BSA:          1.436 m Patient Age:    58 years     BP:           190/74 mmHg Patient Gender: F            HR:           85 bpm. Exam Location:  Inpatient Procedure: 2D Echo (Both Spectral and Color Flow Doppler were utilized during            procedure). Indications:    Stroke  History:        Patient has prior history of Echocardiogram examinations.                 Stroke.  Sonographer:    Norleen Amour Referring Phys: [id number] ROBERT DORRELL IMPRESSIONS  1. Left ventricular ejection fraction, by estimation, is 65 to 70%. The left ventricle has normal function. The left ventricle has no regional wall motion abnormalities. There is mild concentric left ventricular hypertrophy. Left ventricular diastolic parameters were normal.  2. Right ventricular systolic function is normal. The right ventricular size is normal. There is normal pulmonary artery systolic pressure.  3. A small pericardial effusion is present. The pericardial effusion is circumferential. There is no evidence of cardiac tamponade.  4. The mitral valve is normal in structure. No evidence of mitral valve regurgitation. No evidence of mitral stenosis.  5. The aortic valve is normal in structure. Aortic valve regurgitation is mild to moderate. No aortic stenosis is present.  6. Abdominal aorta is normal sized.  7. The inferior vena cava is normal in size with greater than 50% respiratory variability, suggesting right atrial pressure of 3 mmHg. FINDINGS  Left Ventricle: Left ventricular ejection fraction, by estimation, is 65 to 70%. The left ventricle has normal function. The left ventricle has no regional wall motion abnormalities. The left ventricular internal cavity size was normal in size. There is  mild concentric left ventricular hypertrophy. Left ventricular diastolic parameters were normal. Right Ventricle: The right  ventricular size is normal. No increase in right ventricular wall thickness. Right ventricular systolic function is normal. There is normal pulmonary artery systolic pressure. The tricuspid regurgitant velocity is 2.27 m/s, and  with an assumed right atrial pressure of 3 mmHg, the estimated right ventricular systolic pressure is 23.6 mmHg. Left Atrium: Left atrial size was normal in size. Right Atrium: Right atrial size was normal in size. Pericardium: A small pericardial effusion is present. The pericardial effusion is circumferential. There is no evidence of cardiac tamponade. Mitral Valve: The mitral valve is normal in structure. No evidence of mitral valve regurgitation. No evidence of mitral valve stenosis. MV peak gradient, 4.8 mmHg. The mean mitral valve gradient is 3.0 mmHg. Tricuspid Valve: The tricuspid valve is normal in structure. Tricuspid valve regurgitation is not demonstrated. No evidence of tricuspid stenosis. Aortic Valve: The aortic valve is normal in structure. Aortic valve regurgitation is mild to moderate. No aortic stenosis is present. Aortic valve mean gradient measures 3.0 mmHg. Aortic valve peak gradient measures 5.3 mmHg. Aortic valve area, by VTI measures 3.72 cm. Pulmonic Valve: The pulmonic valve was normal in structure. Pulmonic valve regurgitation is not visualized. No evidence of pulmonic stenosis. Aorta: Abdominal aorta is normal sized. The aortic root is  normal in size and structure. Venous: The inferior vena cava is normal in size with greater than 50% respiratory variability, suggesting right atrial pressure of 3 mmHg. IAS/Shunts: No atrial level shunt detected by color flow Doppler.  LEFT VENTRICLE PLAX 2D LVIDd:         3.70 cm     Diastology LVIDs:         1.90 cm     LV e' medial:    6.64 cm/s LV PW:         1.20 cm     LV E/e' medial:  13.4 LV IVS:        1.30 cm     LV e' lateral:   7.62 cm/s LVOT diam:     1.90 cm     LV E/e' lateral: 11.7 LV SV:         70 LV SV Index:    49 LVOT Area:     2.84 cm  LV Volumes (MOD) LV vol d, MOD A2C: 59.4 ml LV vol d, MOD A4C: 67.3 ml LV vol s, MOD A2C: 15.2 ml LV vol s, MOD A4C: 19.2 ml LV SV MOD A2C:     44.2 ml LV SV MOD A4C:     67.3 ml LV SV MOD BP:      47.7 ml RIGHT VENTRICLE             IVC RV Basal diam:  2.40 cm     IVC diam: 1.10 cm RV S prime:     15.70 cm/s TAPSE (M-mode): 2.2 cm      PULMONARY VEINS                             Diastolic Velocity: 51.40 cm/s                             S/D Velocity:       0.90                             Systolic Velocity:  48.20 cm/s LEFT ATRIUM             Index        RIGHT ATRIUM          Index LA diam:        3.40 cm 2.37 cm/m   RA Area:     6.82 cm LA Vol (A2C):   37.9 ml 26.39 ml/m  RA Volume:   10.50 ml 7.31 ml/m LA Vol (A4C):   43.1 ml 30.01 ml/m LA Biplane Vol: 44.2 ml 30.77 ml/m  AORTIC VALVE                    PULMONIC VALVE AV Area (Vmax):    3.01 cm     PV Vmax:       1.07 m/s AV Area (Vmean):   2.61 cm     PV Peak grad:  4.6 mmHg AV Area (VTI):     3.72 cm AV Vmax:           115.00 cm/s AV Vmean:          85.000 cm/s AV VTI:            0.189 m AV Peak Grad:      5.3 mmHg AV Mean Grad:  3.0 mmHg LVOT Vmax:         122.00 cm/s LVOT Vmean:        78.100 cm/s LVOT VTI:          0.248 m LVOT/AV VTI ratio: 1.31  AORTA Ao Root diam: 2.50 cm Ao Asc diam:  2.70 cm MITRAL VALVE                TRICUSPID VALVE MV Area (PHT): 5.58 cm     TR Peak grad:   20.6 mmHg MV Area VTI:   2.76 cm     TR Vmax:        227.00 cm/s MV Peak grad:  4.8 mmHg MV Mean grad:  3.0 mmHg     SHUNTS MV Vmax:       1.09 m/s     Systemic VTI:  0.25 m MV Vmean:      80.6 cm/s    Systemic Diam: 1.90 cm MV Decel Time: 136 msec MV E velocity: 89.10 cm/s MV A velocity: 121.00 cm/s MV E/A ratio:  0.74 Kardie Tobb DO Electronically signed by Dub Huntsman DO Signature Date/Time: 04/24/2024/2:38:49 PM    Final    CT ANGIO HEAD NECK W WO CM Result Date: 04/24/2024 CLINICAL DATA:  Stroke/TIA EXAM: CT ANGIOGRAPHY HEAD AND  NECK WITH AND WITHOUT CONTRAST TECHNIQUE: Multidetector CT imaging of the head and neck was performed using the standard protocol during bolus administration of intravenous contrast. Multiplanar CT image reconstructions and MIPs were obtained to evaluate the vascular anatomy. Carotid stenosis measurements (when applicable) are obtained utilizing NASCET criteria, using the distal internal carotid diameter as the denominator. RADIATION DOSE REDUCTION: This exam was performed according to the departmental dose-optimization program which includes automated exposure control, adjustment of the mA and/or kV according to patient size and/or use of iterative reconstruction technique. CONTRAST:  75mL OMNIPAQUE  IOHEXOL  350 MG/ML SOLN COMPARISON:  Brain MRI April 23, 2024 FINDINGS: CT HEAD: There is low-attenuation in the cerebral white matter There is no hemorrhage. No acute ischemic changes. No mass lesion. The ventricles are normal. Skull/sinuses/orbits: No significant abnormality. CTA NECK: CTA NECK Aortic arch: No proximal vessel stenosis. Right carotid: There is a small amount of plaque with less than 50% stenosis Left carotid: Small amount of plaque with less than 50% stenosis Right vertebral: Normal Left vertebral: Normal Soft tissues: No significant abnormality Other comments: None CTA HEAD: CTA HEAD Right anterior circulation: The internal carotid artery is patent without significant stenosis. The anterior and middle cerebral arteries are patent without significant stenosis or proximal branch occlusion. No aneurysm. Left anterior circulation: The internal carotid artery is patent without significant stenosis. The anterior and middle cerebral arteries are patent without significant stenosis or proximal branch occlusion. No aneurysm. Posterior circulation: Both vertebral arteries are patent intracranially. No basilar stenosis. There is mild stenosis of the P1 segment of the right posterior cerebral artery. IMPRESSION:  1. Bilateral carotid plaque with less than 50% stenosis 2. No significant vertebrobasilar or intracranial disease 3. Chronic white matter abnormalities. No acute abnormality noted on the CT. Electronically Signed   By: Nancyann Burns M.D.   On: 04/24/2024 09:43   MR BRAIN WO CONTRAST Result Date: 04/23/2024 EXAM: MRI BRAIN WITHOUT CONTRAST 04/23/2024 05:42:47 PM TECHNIQUE: Multiplanar multisequence MRI of the head/brain was performed without the administration of intravenous contrast. COMPARISON: Same day CT head and MRI head 09/23/2023. CLINICAL HISTORY: Vision loss, known etiology. FINDINGS: BRAIN AND VENTRICLES: There is a 4 mm focus of acute  infarct within the right dorsal aspect of the midbrain partially involving the periaqueductal gray matter seen on series 2 image 22. No significant associated edema or mass effect. Remote lacunar infarcts are present in the bilateral basal ganglia and thalami, left greater than right, and in the right corona radiata. Volume loss in the left cerebral peduncle is likely related to prior infarcts. T2 and FLAIR hyperintensity in the periventricular and subcortical white matter with additional signal abnormality in the pons is suggestive of moderate chronic microvascular ischemic changes. Multiple scattered chronic microhemorrhages are present, particularly within the thalami and brainstem, likely related to hypertensive encephalopathy. Few additional smaller chronic microhemorrhages are seen in the cerebral hemispheres. There is mild generalized parenchymal volume loss. No mass. No midline shift. No hydrocephalus. The sella is unremarkable. Normal flow voids. ORBITS: Bilateral lens replacement. SINUSES AND MASTOIDS: Mucosal thickening in the left sphenoid sinus. BONES AND SOFT TISSUES: Normal marrow signal. Degenerative changes in the visualized upper cervical spine. Prominent signal abnormality at C4-C5 likely reflecting chronic degenerative endplate changes. No acute soft  tissue abnormality. IMPRESSION: 1. Acute infarct in the right dorsal midbrain measuring 4 mm, partially involving the periaqueductal gray matter. No significant edema or mass effect. 2. Multiple scattered chronic microhemorrhages, particularly within the thalami and brainstem, likely related to hypertensive encephalopathy. 3. Moderate chronic microvascular ischemic changes, mild generalized parenchymal volume loss, remote lacunar infarcts as above. Electronically signed by: Donnice Mania MD 04/23/2024 06:01 PM EST RP Workstation: [id number]   CT HEAD WO CONTRAST Result Date: 04/23/2024 EXAM: CT HEAD WITHOUT CONTRAST 04/23/2024 02:19:26 PM TECHNIQUE: CT of the head was performed without the administration of intravenous contrast. Automated exposure control, iterative reconstruction, and/or weight based adjustment of the mA/kV was utilized to reduce the radiation dose to as low as reasonably achievable. COMPARISON: 09/23/2023 CLINICAL HISTORY: Neuro deficit, acute, stroke suspected. FINDINGS: BRAIN AND VENTRICLES: No acute hemorrhage. No evidence of acute infarct. Patchy and confluent decreased attenuation throughout deep and periventricular white matter of cerebral hemispheres bilaterally, compatible with chronic microvascular ischemic disease. Chronic right corona radiata, left basal ganglia lacunar infarcts. No hydrocephalus. No extra-axial collection. No mass effect or midline shift. ORBITS: Bilateral lens replacement. SINUSES: Left sphenoid sinus mucosal thickening. SOFT TISSUES AND SKULL: No acute soft tissue abnormality. No skull fracture. VASCULATURE: Atherosclerotic calcifications within cavernous internal carotid arteries. IMPRESSION: 1. No acute intracranial abnormality. 2. Moderate chronic microvascular ischemic disease. Chronic right corona radiata and left basal ganglia lacunar infarcts. Electronically signed by: Donnice Mania MD 04/23/2024 02:41 PM EST RP Workstation: [id number]     Assessment/Plan: Diagnosis: Acute R midbrain infarct Does the need for close, 24 hr/day medical supervision in concert with the patient's rehab needs make it unreasonable for this patient to be served in a less intensive setting? Yes Co-Morbidities requiring supervision/potential complications:  1) Diabetes: continue insulin  2) HTN: plan to normalize BM in 1-2 days 3) HLD: continue Crestor  4) Leukopenia: continue to monitor WBC 5) ESRD: continue to monitor creatinine Due to bladder management, bowel management, safety, skin/wound care, disease management, medication administration, pain management, and patient education, does the patient require 24 hr/day rehab nursing? Yes Does the patient require coordinated care of a physician, rehab nurse, therapy disciplines of PT, OT, SLP to address physical and functional deficits in the context of the above medical diagnosis(es)? Yes Addressing deficits in the following areas: balance, endurance, locomotion, strength, transferring, bowel/bladder control, bathing, dressing, feeding, grooming, toileting, speech, and psychosocial support Can the patient actively participate in an intensive therapy  program of at least 3 hrs of therapy per day at least 5 days per week? Yes The potential for patient to make measurable gains while on inpatient rehab is excellent Anticipated functional outcomes upon discharge from inpatient rehab are modified independent  with PT, modified independent with OT, modified independent with SLP. Estimated rehab length of stay to reach the above functional goals is: 10-14 days Anticipated discharge destination: Home Overall Rehab/Functional Prognosis: excellent  POST ACUTE RECOMMENDATIONS: This patient's condition is appropriate for continued rehabilitative care in the following setting: CIR Patient has agreed to participate in recommended program. Yes Note that insurance prior authorization may be required for reimbursement  for recommended care.   I have personally performed a face to face diagnostic evaluation of this patient. Additionally, I have examined the patient's medical record including any pertinent labs and radiographic images.    Thanks,  Sven SHAUNNA Elks, MD 04/25/2024

## 2024-04-25 NOTE — Progress Notes (Signed)
 SLP Cancellation Note  Patient Details Name: Hannah [name] MRN: [id number] DOB: Aug 20, 1965   Cancelled treatment:       Reason Eval/Treat Not Completed: Patient at procedure or test/unavailable. Pt with MD and PT. Will return as able   Veona Bittman, Consuelo Fitch 04/25/2024, 2:55 PM

## 2024-04-25 NOTE — Inpatient Diabetes Management (Signed)
 Inpatient Diabetes Program Recommendations  AACE/ADA: New Consensus Statement on Inpatient Glycemic Control (2015)  Target Ranges:  Prepandial:   less than 140 mg/dL      Peak postprandial:   less than 180 mg/dL (1-2 hours)      Critically ill patients:  140 - 180 mg/dL   Lab Results  Component Value Date   GLUCAP 250 (H) 04/25/2024   HGBA1C 8.5 (H) 04/24/2024    Review of Glycemic Control  Latest Reference Range & Units 04/23/24 13:30 04/24/24 12:40 04/24/24 16:47 04/24/24 21:08 04/25/24 06:07 04/25/24 11:17  Glucose-Capillary 70 - 99 mg/dL 826 (H) 696 (H) 744 (H) 171 (H) 95 250 (H)  (H): Data is abnormally high  Diabetes history: DM2 Outpatient Diabetes medications: Tresiba  8-10 units every day, Humalog  4-6 units, Freestyle Libre 2 CGM Current orders for Inpatient glycemic control: Lantus  10 units every day, Novolog  0-6 units TID  Inpatient Diabetes Program Recommendations:    Postprandials elevated.  Might consider:  Novolog  3 units TID with meals if she consumes at least 50%.  Thank you, Wyvonna Pinal, MSN, CDCES Diabetes Coordinator Inpatient Diabetes Program [phone] (team pager from 8a-5p)

## 2024-04-25 NOTE — Evaluation (Signed)
 Occupational Therapy Evaluation Patient Details Name: Hannah [name] MRN: [id number] DOB: 1965/07/07 Today's Date: 04/25/2024   History of Present Illness   58 y.o. female presents to [facility] hospital on 04/23/2024 with visual changes and aphasia. MRI demonstrates R dorsal midbrain CVA. PMH includes CAD, CVA with residual R weakness, ESRD, HTN, HLD., MI, MS.     Clinical Impressions Pt greeted in supine, agreeable for OT visit. Supportive daughter at bedside. PTA, pt needed light assist with ADLs (bathing/dressing) and primary means of mobility via power w/c except for walking short distances with RW. No visual deficits at baseline. Today, she presents with suspected visual impairments, balance deficits, and generalized weakness. Functionally, she was mod-max A for supine<>sit and required min/mod A to sustain static sitting balance. Often with L and posterior leans in sitting & standing. Max/total A for LB ADLs and up to min A anticipated for UB ADLs. She did stand to RW with mod A and needed mod/max A to attempt lateral side stepping towards HOB with RW.  Pt is currently functioning below baseline and would benefit from ongoing acute OT services to progress towards safe discharge and to facilitate return to prior level of function. Current recommendation is high-intensity post-acute rehab (> 3 hours/day).      If plan is discharge home, recommend the following:   Two people to help with walking and/or transfers;A lot of help with bathing/dressing/bathroom;Assistance with cooking/housework;Direct supervision/assist for medications management;Direct supervision/assist for financial management;Assist for transportation;Help with stairs or ramp for entrance;Supervision due to cognitive status     Functional Status Assessment   Patient has had a recent decline in their functional status and demonstrates the ability to make significant improvements in function in a reasonable and predictable amount  of time.     Equipment Recommendations   Other (comment) (defer to next level of care)     Recommendations for Other Services   Rehab consult     Precautions/Restrictions   Precautions Precautions: Fall Recall of Precautions/Restrictions: Intact Precaution/Restrictions Comments: residual R hemiparesis Restrictions Weight Bearing Restrictions Per Provider Order: No     Mobility Bed Mobility Overal bed mobility: Needs Assistance Bed Mobility: Supine to Sit, Sit to Supine   Sidelying to sit: Mod assist, HOB elevated, Used rails Supine to sit: Max assist, HOB elevated, Used rails     General bed mobility comments: Exited to the L side, cues for sequencing and initiation, assist for LE mgmt and trunk elevation    Transfers Overall transfer level: Needs assistance Equipment used: Rolling walker (2 wheels) Transfers: Sit to/from Stand Sit to Stand: Mod assist           General transfer comment: Stood from bed with VC for hand placement and sequencing. Incr time to power up and come to fully upright posture. VC for forward gaze.      Balance Overall balance assessment: Needs assistance Sitting-balance support: No upper extremity supported, Feet supported Sitting balance-Leahy Scale: Poor Sitting balance - Comments: min A to maintain Postural control: Posterior lean, Left lateral lean Standing balance support: Bilateral upper extremity supported, Reliant on assistive device for balance Standing balance-Leahy Scale: Poor Standing balance comment: mod A to maintain, also leaning to the L and posteriorly in stance, incr difficulty with WB through RLE to advance LLE towards HOB. Heavily reliant on RW in stance.                           ADL either performed  or assessed with clinical judgement   ADL Overall ADL's : Needs assistance/impaired Eating/Feeding: Set up;Bed level Eating/Feeding Details (indicate cue type and reason): observation, feeding self  primarily with L hand Grooming: Minimal assistance   Upper Body Bathing: Moderate assistance   Lower Body Bathing: Total assistance   Upper Body Dressing : Moderate assistance   Lower Body Dressing: Total assistance Lower Body Dressing Details (indicate cue type and reason): adjusting socks     Toileting- Clothing Manipulation and Hygiene: Total assistance Toileting - Clothing Manipulation Details (indicate cue type and reason): purewick intact             Vision Baseline Vision/History: 0 No visual deficits Ability to See in Adequate Light: 0 Adequate Patient Visual Report: Diplopia;Blurring of vision;Peripheral vision impairment Vision Assessment?: Yes Eye Alignment:  (dysconjugate when gazing in inferior field) Additional Comments: difficult to formally assess 2/2 poor seated balance and poor bed/room setup - suspect some deficits - further assess as able; pt endorsing acute diplopia, blurred vision, and reduced peripheral vision; EOM appeared grossly intact     Perception         Praxis         Pertinent Vitals/Pain Pain Assessment Pain Assessment: No/denies pain     Extremity/Trunk Assessment Upper Extremity Assessment Upper Extremity Assessment: Right hand dominant;RUE deficits/detail;LUE deficits/detail RUE Deficits / Details: residual R hemiparesis from prior CVA, shoulder flexion ~3/5, elbow flexion/extension ~3+/5, grip strength ~3+/5 RUE Sensation: WNL RUE Coordination: decreased fine motor;decreased gross motor LUE Deficits / Details: grossly 4-/5 to MMT   Lower Extremity Assessment Lower Extremity Assessment: Defer to PT evaluation       Communication Communication Communication: Impaired Factors Affecting Communication: Reduced clarity of speech   Cognition Arousal: Alert Behavior During Therapy: WFL for tasks assessed/performed Cognition: Cognition impaired   Orientation impairments: Time (external aides to assist) Awareness: Intellectual  awareness impaired   Attention impairment (select first level of impairment): Sustained attention   OT - Cognition Comments: needs cues to veralize insight into deficits, then able to recognize deficits when prompted; delayed processing                 Following commands: Intact       Cueing  General Comments   Cueing Techniques: Verbal cues;Gestural cues;Tactile cues  Supportive daughter & son present   Exercises     Shoulder Instructions      Home Living Family/patient expects to be discharged to:: Private residence Living Arrangements: Children Available Help at Discharge: Family;Available 24 hours/day Type of Home: Apartment Home Access: Stairs to enter Entrance Stairs-Number of Steps: threshold Entrance Stairs-Rails: None Home Layout: One level     Bathroom Shower/Tub: Chief Strategy Officer: Standard     Home Equipment: Tub bench;Rolling Environmental Consultant (2 wheels);BSC/3in1;Wheelchair - manual;Cane - single point          Prior Functioning/Environment Prior Level of Function : Needs assist             Mobility Comments: son or daughter assist the pt in stand pivot transfers to her manual wheelchair. Pt's MWC does not fit in the bathroom so she walks with min assistance for ~5-10' to reach the commode or tub ADLs Comments: family assisting with bathing and dressing but daughter feels like pt has potential to be able to do more. family assists with IADLs    OT Problem List: Decreased strength;Decreased range of motion;Decreased activity tolerance;Impaired vision/perception;Impaired balance (sitting and/or standing);Decreased coordination;Decreased cognition;Decreased safety awareness  OT Treatment/Interventions: Self-care/ADL training;DME and/or AE instruction;Therapeutic exercise;Neuromuscular education;Therapeutic activities;Cognitive remediation/compensation;Visual/perceptual remediation/compensation;Patient/family education;Balance training       OT Goals(Current goals can be found in the care plan section)   Acute Rehab OT Goals Patient Stated Goal: get better OT Goal Formulation: With patient/family Time For Goal Achievement: 05/09/24 Potential to Achieve Goals: Good   OT Frequency:  Min 2X/week    Co-evaluation              AM-PAC OT 6 Clicks Daily Activity     Outcome Measure Help from another person eating meals?: A Little Help from another person taking care of personal grooming?: A Little Help from another person toileting, which includes using toliet, bedpan, or urinal?: Total Help from another person bathing (including washing, rinsing, drying)?: A Lot Help from another person to put on and taking off regular upper body clothing?: A Lot Help from another person to put on and taking off regular lower body clothing?: Total 6 Click Score: 12   End of Session Equipment Utilized During Treatment: Rolling walker (2 wheels) Nurse Communication: Mobility status  Activity Tolerance: Patient tolerated treatment well Patient left: in bed;with call bell/phone within reach;with bed alarm set;with family/visitor present  OT Visit Diagnosis: Unsteadiness on feet (R26.81);Muscle weakness (generalized) (M62.81);Cognitive communication deficit (R41.841);Hemiplegia and hemiparesis Symptoms and signs involving cognitive functions: Cerebral infarction Hemiplegia - Right/Left: Right Hemiplegia - dominant/non-dominant: Dominant Hemiplegia - caused by: Cerebral infarction                Time: 8752-8687 OT Time Calculation (min): 25 min Charges:  OT General Charges $OT Visit: 1 Visit OT Evaluation $OT Eval Moderate Complexity: 1 Mod  Destyn Schuyler M. [place], OTR/L [facility] Acute Rehabilitation Services [phone] Secure Chat Preferred  Rikki Burma 04/25/2024, 1:53 PM

## 2024-04-25 NOTE — TOC CAGE-AID Note (Signed)
 Transition of Care [facility]) - CAGE-AID Screening   Patient Details  Name: Hannah [name] MRN: [id number] Date of Birth: May 16, 1966  Transition of Care [facility]) CM/SW Contact:    Makiyah Zentz E Herby Amick, LCSW Phone Number: 04/25/2024, 10:45 AM   Clinical Narrative: No SA noted.   CAGE-AID Screening:    Have You Ever Felt You Ought to Cut Down on Your Drinking or Drug Use?: No Have People Annoyed You By Critizing Your Drinking Or Drug Use?: No Have You Felt Bad Or Guilty About Your Drinking Or Drug Use?: No Have You Ever Had a Drink or Used Drugs First Thing In The Morning to Steady Your Nerves or to Get Rid of a Hangover?: No CAGE-AID Score: 0  Substance Abuse Education Offered: No

## 2024-04-25 NOTE — Progress Notes (Signed)
°  Inpatient Rehabilitation Admissions Coordinator   Met with patient and daughter at bedside for rehab assessment. We discussed goals and expectations of a possible CIR admit. She was previously at CIR twice in 2022. They prefer CIR for rehab.Daughter and son can provide 24/7 support at home as they do prior to admit. Daughter states over the past 1 1/2 years, patient has declined in function with adls as their routines have had daughter taking more responsibility due to efficiency. She is hopeful that her Mom can increase her functional abilities with CIR admit. Dr Lorilee has consulted today. I await OT eval and then will begin Auth with [facility] Medicare for possible Cir admit. Please call me with any questions.   Heron Leavell, RN, MSN Rehab Admissions Coordinator [phone]

## 2024-04-26 LAB — RENAL FUNCTION PANEL
Albumin: 3 g/dL — ABNORMAL LOW (ref 3.5–5.0)
Anion gap: 9 (ref 5–15)
BUN: 33 mg/dL — ABNORMAL HIGH (ref 6–20)
CO2: 29 mmol/L (ref 22–32)
Calcium: 8.6 mg/dL — ABNORMAL LOW (ref 8.9–10.3)
Chloride: 98 mmol/L (ref 98–111)
Creatinine, Ser: 6.93 mg/dL — ABNORMAL HIGH (ref 0.44–1.00)
GFR, Estimated: 6 mL/min — ABNORMAL LOW (ref >60)
Glucose, Bld: 141 mg/dL — ABNORMAL HIGH (ref 70–99)
Phosphorus: 5.7 mg/dL — ABNORMAL HIGH (ref 2.5–4.6)
Potassium: 4.8 mmol/L (ref 3.5–5.1)
Sodium: 136 mmol/L (ref 135–145)

## 2024-04-26 LAB — CBC
HCT: 32.3 % — ABNORMAL LOW (ref 36.0–46.0)
Hemoglobin: 10.3 g/dL — ABNORMAL LOW (ref 12.0–15.0)
MCH: 30.8 pg (ref 26.0–34.0)
MCHC: 31.9 g/dL (ref 30.0–36.0)
MCV: 96.7 fL (ref 80.0–100.0)
Platelets: 173 K/uL (ref 150–400)
RBC: 3.34 MIL/uL — ABNORMAL LOW (ref 3.87–5.11)
RDW: 16.2 % — ABNORMAL HIGH (ref 11.5–15.5)
WBC: 4 K/uL (ref 4.0–10.5)
nRBC: 0 % (ref 0.0–0.2)

## 2024-04-26 LAB — GLUCOSE, CAPILLARY
Glucose-Capillary: 94 mg/dL (ref 70–99)
Glucose-Capillary: 170 mg/dL — ABNORMAL HIGH (ref 70–99)
Glucose-Capillary: 199 mg/dL — ABNORMAL HIGH (ref 70–99)
Glucose-Capillary: 324 mg/dL — ABNORMAL HIGH (ref 70–99)

## 2024-04-26 LAB — HEPATITIS B SURFACE ANTIBODY, QUANTITATIVE: Hep B S AB Quant (Post): 41.4 m[IU]/mL

## 2024-04-26 MED ORDER — PENTAFLUOROPROP-TETRAFLUOROETH EX AERO: 1.0000 | INHALATION_SPRAY | CUTANEOUS | Status: DC | PRN

## 2024-04-26 MED ORDER — HYDRALAZINE HCL 25 MG PO TABS
25.0000 mg | ORAL_TABLET | Freq: Once | ORAL | Status: AC
  Administered 2024-04-26: 25 mg via ORAL
  Filled 2024-04-26: qty 1

## 2024-04-26 MED ORDER — ANTICOAGULANT SODIUM CITRATE 4% (200MG/5ML) IV SOLN: 5.0000 mL | Status: DC | PRN

## 2024-04-26 MED ORDER — ALTEPLASE 2 MG IJ SOLR: 2.0000 mg | Freq: Once | INTRAMUSCULAR | Status: DC | PRN

## 2024-04-26 MED ORDER — INSULIN ASPART 100 UNIT/ML IJ SOLN
0.0000 [IU] | Freq: Three times a day (TID) | INTRAMUSCULAR | Status: DC
  Administered 2024-04-26 (×2): 2 [IU] via SUBCUTANEOUS
  Administered 2024-04-27: 1 [IU] via SUBCUTANEOUS
  Administered 2024-04-27: 2 [IU] via SUBCUTANEOUS
  Administered 2024-04-27: 3 [IU] via SUBCUTANEOUS
  Administered 2024-04-28: 5 [IU] via SUBCUTANEOUS
  Administered 2024-04-28 – 2024-04-29 (×3): 3 [IU] via SUBCUTANEOUS
  Administered 2024-04-29 – 2024-04-30 (×2): 2 [IU] via SUBCUTANEOUS
  Administered 2024-04-30: 14:00:00 1 [IU] via SUBCUTANEOUS
  Filled 2024-04-26 (×2): qty 3
  Filled 2024-04-26: qty 2
  Filled 2024-04-26: qty 5
  Filled 2024-04-26: qty 2
  Filled 2024-04-26: qty 1
  Filled 2024-04-26 (×2): qty 2
  Filled 2024-04-26: qty 1
  Filled 2024-04-26: qty 2
  Filled 2024-04-26 (×2): qty 3

## 2024-04-26 MED ORDER — HEPARIN SODIUM (PORCINE) 1000 UNIT/ML DIALYSIS: 1000.0000 [IU] | INTRAMUSCULAR | Status: DC | PRN

## 2024-04-26 MED ORDER — LIDOCAINE HCL (PF) 1 % IJ SOLN: 5.0000 mL | INTRAMUSCULAR | Status: DC | PRN

## 2024-04-26 MED ORDER — HYDRALAZINE HCL 50 MG PO TABS
50.0000 mg | ORAL_TABLET | Freq: Three times a day (TID) | ORAL | Status: DC
  Administered 2024-04-26 – 2024-04-29 (×10): 50 mg via ORAL
  Filled 2024-04-26 (×11): qty 1

## 2024-04-26 MED ORDER — LIDOCAINE-PRILOCAINE 2.5-2.5 % EX CREA: 1.0000 | TOPICAL_CREAM | CUTANEOUS | Status: DC | PRN

## 2024-04-26 NOTE — Evaluation (Signed)
 Speech Language Pathology Evaluation Patient Details Name: Hannah [name] MRN: [id number] DOB: Jul 29, 1965 Today's Date: 04/26/2024 Time: 9167-9141 SLP Time Calculation (min) (ACUTE ONLY): 26 min  Problem List:  Patient Active Problem List   Diagnosis Date Noted   Acute hypoxic respiratory failure (HCC) 04/24/2024   Acute CVA (cerebrovascular accident) (HCC) 04/24/2024   CVA (cerebral vascular accident) (HCC) 04/23/2024   Symptomatic anemia 09/26/2023   Anemia 09/23/2023   Chorioretinitis 09/23/2023   Dependent edema 06/09/2022   Long term (current) use of insulin  (HCC) 01/14/2022   Chronic constipation 06/09/2021   Altered mental status, unspecified 01/12/2021   Other chronic postprocedural pain 01/09/2021   Spastic hemiplegia due to cerebrovascular disease (HCC) 11/03/2020   Debility 10/18/2020   ESRD on hemodialysis (HCC)    SOB (shortness of breath) 09/27/2020   CKD (chronic kidney disease) stage 5, GFR less than 15 ml/min (HCC) 09/13/2020   H/O: CVA (cerebrovascular accident) 09/12/2020   Chronic kidney disease (CKD), stage IV (severe) (HCC)    Type 2 diabetes mellitus (HCC)    Essential hypertension    Right pontine cerebrovascular accident (HCC) 06/12/2020   Stroke-like symptoms 06/09/2020   Acute kidney injury superimposed on CKD 06/09/2020   Cerebral thrombosis with cerebral infarction 06/09/2020   Hyperlipidemia associated with type 2 diabetes mellitus (HCC)    Bimalleolar ankle fracture 11/24/2018   Lower abdominal pain 07/28/2017   Bladder mass 07/18/2017   Painless hematuria 07/18/2017   Intractable vomiting with nausea 07/12/2017   Hemiparesis affecting right side as late effect of cerebrovascular accident (CVA) (HCC) 09/22/2016   AKI (acute kidney injury) 01/08/2016   Multiple sclerosis 2009   CAD S/P PCI x 2 2009   History of myocardial infarction due to atherothrombotic coronary artery disease 2009   Past Medical History:  Past Medical History:   Diagnosis Date   CAD S/P PCI x 2 2009   MI 2009 -> PCI x 2 ([place]); 01/2016 Nuc ST Nonischemic with Normal EF   Cerebral infarction (HCC) 07/12/2017   2009 first one, has had a total of 4 stokes- last 2021   Chronic kidney disease    Hemo M-W- F   Diabetes type 2, controlled (HCC)    Hemiparesis affecting right side as late effect of cerebrovascular accident (CVA) (HCC)    Hyperlipidemia    Hypertension    Intractable vomiting with nausea 07/12/2017   Multiple sclerosis 2009   Myocardial infarction [facility]) 2009   Stroke [facility])    Past Surgical History:  Past Surgical History:  Procedure Laterality Date   A/V FISTULAGRAM Right 02/12/2021   Procedure: A/V FISTULAGRAM;  Surgeon: Gretta Lonni PARAS, MD;  Location: [facility] INVASIVE CV LAB;  Service: Cardiovascular;  Laterality: Right;   A/V FISTULAGRAM Right 05/20/2022   Procedure: A/V Fistulagram;  Surgeon: Gretta Lonni PARAS, MD;  Location: [facility] INVASIVE CV LAB;  Service: Cardiovascular;  Laterality: Right;   A/V FISTULAGRAM N/A 05/05/2023   Procedure: A/V Fistulagram;  Surgeon: Melia Lynwood ORN, MD;  Location: [facility] INVASIVE CV LAB;  Service: Cardiovascular;  Laterality: N/A;   A/V SHUNT INTERVENTION N/A 08/16/2023   Procedure: A/V SHUNT INTERVENTION;  Surgeon: Tobie Gordy POUR, MD;  Location: [facility] INVASIVE CV LAB;  Service: Cardiovascular;  Laterality: N/A;   A/V SHUNTOGRAM Right 09/26/2023   Procedure: A/V SHUNTOGRAM;  Surgeon: Pearline Norman RAMAN, MD;  Location: [facility] INVASIVE CV LAB;  Service: Cardiovascular;  Laterality: Right;   APPENDECTOMY     AV FISTULA PLACEMENT Right 10/02/2020   Procedure:  RIGHT ARTERIOVENOUS (AV) FISTULA CREATION;  Surgeon: Sheree Penne Bruckner, MD;  Location: [facility] OR;  Service: Vascular;  Laterality: Right;   AV FISTULA PLACEMENT Right 05/28/2022   Procedure: RIGHT ARM ARTERIOVENOUS (AV) FISTULA REVISION WITH GORETEX GRAFT;  Surgeon: Gretta Bruckner PARAS, MD;  Location: [facility] OR;  Service: Vascular;  Laterality: Right;    BASCILIC VEIN TRANSPOSITION Right 11/24/2020   Procedure: SECOND STAGE RIGHT ARM BASCILIC VEIN TRANSPOSITION;  Surgeon: Gretta Bruckner PARAS, MD;  Location: [facility] OR;  Service: Vascular;  Laterality: Right;   CYSTOSTOMY W/ BLADDER BIOPSY     INSERTION OF DIALYSIS CATHETER Right 05/28/2022   Procedure: INSERTION OF TUNNELED DIALYSIS CATHETER IN THE RIGHT INTERNAL JUGULAR;  Surgeon: Gretta Bruckner PARAS, MD;  Location: [facility] OR;  Service: Vascular;  Laterality: Right;   IR FLUORO GUIDE CV LINE RIGHT  09/29/2020   IR US  GUIDE VASC ACCESS RIGHT  09/29/2020   NM MYOVIEW  LTD  12/25/2015   [place], [place] - NORMAL /NEGATIVE for ischemia. Normal LV Fxn   OVARIAN CYST SURGERY     PERCUTANEOUS CORONARY STENT INTERVENTION (PCI-S)  2009   [place], [place] - 2 stents in settng of MI -- unknown details   PERIPHERAL VASCULAR BALLOON ANGIOPLASTY Right 02/12/2021   Procedure: PERIPHERAL VASCULAR BALLOON ANGIOPLASTY;  Surgeon: Gretta Bruckner PARAS, MD;  [facility]: [facility] INVASIVE CV LAB;  Service: Cardiovascular;  Laterality: Right;  UPPER ARM FISTULA   PERIPHERAL VASCULAR BALLOON ANGIOPLASTY  05/20/2022   Procedure: PERIPHERAL VASCULAR BALLOON ANGIOPLASTY;  Surgeon: Gretta Bruckner PARAS, MD;  [facility]: [facility] INVASIVE CV LAB;  Service: Cardiovascular;;  rt arm fistula site   PERIPHERAL VASCULAR BALLOON ANGIOPLASTY Right 01/28/2022   Procedure: PERIPHERAL VASCULAR BALLOON ANGIOPLASTY;  Surgeon: Gretta Bruckner PARAS, MD;  [facility]: [facility] INVASIVE CV LAB;  Service: Cardiovascular;  Laterality: Right;  Arm fistula   PERIPHERAL VASCULAR BALLOON ANGIOPLASTY  05/05/2023   Procedure: PERIPHERAL VASCULAR BALLOON ANGIOPLASTY;  Surgeon: Melia Lynwood ORN, MD;  Location: [facility] INVASIVE CV LAB;  Service: Cardiovascular;;  RUA AVF   TRANSTHORACIC ECHOCARDIOGRAM  07/11/2017   a) 12/2015, [place]): Moderate Conc LVH w/ sigmoid septum.  No LVOT obstruction.  Hyperdynamic LV fxn - EF 65 to 70%.  GR 1 DD & high LAP/LVEDP.  AoV Sclerosis - No AS;; b)  06/2017: [facility]): Normal LV Fxn - EF 60%.  Abnormal septal motion.  Mild LVH mild MAC.  Negative bubble study (for CVA)   TRANSTHORACIC ECHOCARDIOGRAM  12/2020   a) 05/2020: EF 60-65%. No RWMA. Gr2DD. Sm posterior pericardial effusion. No AS.; b) 09/2020: EF 70-75%.  Hyperdynamic.  No RWMA. ? D Fxn/LAP.  Mildly elevated PAP.  Pericardial effusion.  Mild ao V sclerosis cyst.; c) 12/2020 (Limited): EF 65-70%. No RWMA. Gr1-2 DD. Mild Conc LVH- high LAP. Nl PAP. Circumferential effusion w/o tamponade. AoV Sclerosis - no AS. Nl RAP.   ULTRASOUND GUIDANCE FOR VASCULAR ACCESS Right 05/28/2022   Procedure: ULTRASOUND GUIDANCE FOR VASCULAR ACCESS;  Surgeon: Gretta Bruckner PARAS, MD;  Location: [facility] OR;  Service: Vascular;  Laterality: Right;   VENOUS ANGIOPLASTY Right 08/16/2023   Procedure: VENOUS ANGIOPLASTY;  Surgeon: Tobie Gordy POUR, MD;  Location: [facility] INVASIVE CV LAB;  Service: Cardiovascular;  Laterality: Right;  70% Innominate; 70% Outflow Basilic   HPI:  58 y.o. female presents to [facility] hospital on 04/23/2024 with visual changes and aphasia. MRI demonstrates R dorsal midbrain CVA. PMH includes CAD, CVA with residual R weakness, ESRD, HTN, HLD., MI, MS. Seen at outpatient ST for dysphagia  and dysarthria in 2024.   Assessment / Plan / Recommendation Clinical Impression  Pt has dysarthria at baseline and needs assist at home for cognitive tasks. She is not responsible for managing meds, finances, recalling appointments and daughter handles this and pt states she mostly watches tv at home. Her language was intact, fluent and expressed herself without difficulty and followed 3 step command. Articulation is imprecise but intelligible and she feels it is at baseline. Cognitively she scored within average range on Cognistat subtest and recalling relevant recent information. She demonstrated functional verbal problem solving for basic acute situations. SLP educated pt re: strategies for dysarthria. She  appears functional for acute setting and is being considered for inpatient CIR. Called daughter for information re: baseline and current status and left voicemail. ST will not follow on acute however suggest assessment at next venue of care if needed depending on goals and pt's responsibilities for return to home.    SLP Assessment  SLP Recommendation/Assessment: All further Speech Language Pathology needs can be addressed in the next venue of care SLP Visit Diagnosis: Cognitive communication deficit (R41.841)     Assistance Recommended at Discharge  Set up Supervision/Assistance  Functional Status Assessment Patient has not had a recent decline in their functional status  Frequency and Duration           SLP Evaluation Cognition  Overall Cognitive Status: History of cognitive impairments - at baseline Arousal/Alertness: Awake/alert Orientation Level: Oriented X4 Month: December Day of Week: Correct Attention: Sustained Sustained Attention: Appears intact Memory: Appears intact (for 4 word recall) Awareness:  [facility]) Problem Solving: Appears intact (for basic in acute setting) Safety/Judgment:  (needs assist at baseline)       Comprehension  Auditory Comprehension Overall Auditory Comprehension: Appears within functional limits for tasks assessed Commands:  (followed 3 step command) Visual Recognition/Discrimination Discrimination: Not tested Reading Comprehension Reading Status: Not tested    Expression Expression Primary Mode of Expression: Verbal Verbal Expression Overall Verbal Expression: Appears within functional limits for tasks assessed Initiation: No impairment Level of Generative/Spontaneous Verbalization: Conversation Repetition:  (NT) Naming: Impairment (named 6 animals one min with goal of 15- suspect baseline delayed processing) Confrontation: Within functional limits Pragmatics: No impairment Written Expression Written Expression: Not tested   Oral /  Motor  Motor Speech Overall Motor Speech: Impaired at baseline Intelligibility: Intelligible Motor Planning: Within functional limits            Dustin Olam Bull 04/26/2024, 9:28 AM

## 2024-04-26 NOTE — Progress Notes (Signed)
 Pt is transferred back to the floor at 3W30 after HD. No acute distress at arrival. Vital sign stable. We will monitor.  Wendi Dash, RN

## 2024-04-26 NOTE — Plan of Care (Signed)

## 2024-04-26 NOTE — Progress Notes (Signed)
 Received patient in bed to unit.  Alert and oriented.  Informed consent signed and in chart.   TX duration: 3  Patient tolerated well.  Transported back to the room  Alert, without acute distress.  Hand-off given to patient's nurse. Floor nurse  Access used: AVG Access issues: Prolonged bleeding approximately 30 minutes,homeostasis obtained  Total UF removed: 2000 Medication(s) given: None Post HD VS: T98.5-HR87-B/P144/73 Post HD weight: 44.3kg  Neville Seip, RN [facility]   04/26/24 0230  Vitals  Temp 98.5 F (36.9 C)  Temp Source Oral  BP (!) 144/73  MAP (mmHg) 92  BP Location Left Arm  BP Method Automatic  Patient Position (if appropriate) Lying  Pulse Rate 87  Pulse Rate Source Monitor  ECG Heart Rate 86  Resp 11  Weight 44.3 kg  Type of Weight Post-Dialysis  Oxygen Therapy  SpO2 100 %  O2 Device Room Air  Patient Activity (if Appropriate) In bed  Pulse Oximetry Type Continuous  During Treatment Monitoring  Blood Flow Rate (mL/min) 400 mL/min  Arterial Pressure (mmHg) -226.05 mmHg  Venous Pressure (mmHg) 287.66 mmHg  TMP (mmHg) 5.25 mmHg  Ultrafiltration Rate (mL/min) 900 mL/min  Dialysate Flow Rate (mL/min) 299 ml/min  Dialysate Potassium Concentration 3  Dialysate Calcium  Concentration 2.5  Duration of HD Treatment -hour(s) 3 hour(s)  Cumulative Fluid Removed (mL) per Treatment  2000.19  HD Safety Checks Performed Yes  Intra-Hemodialysis Comments Tolerated well;Tx completed  Post Treatment  Dialyzer Clearance Lightly streaked  Liters Processed 72  Fluid Removed (mL) 2000 mL  Tolerated HD Treatment Yes  Post-Hemodialysis Comments prolonged bleeding approximately 30 minutes pressure  AVG/AVF Arterial Site Held (minutes) 30 minutes  AVG/AVF Venous Site Held (minutes) 30 minutes  Fistula / Graft Right Upper arm Arteriovenous fistula  Placement Date/Time: 11/24/20 0812   Placed prior to admission: No  Orientation: Right  Access Location:  Upper arm  Access Type: (c) Arteriovenous fistula  Site Condition Bleeding (Obtained homeostasis after 30 minutes with pressure)  Fistula / Graft Assessment Present;Thrill;Bruit  Status Deaccessed;Patent  Needle Size 16  Drainage Description None

## 2024-04-26 NOTE — PMR Pre-admission (Signed)
 PMR Admission Coordinator Pre-Admission Assessment  Patient: Hannah [name] is an 58 y.o., female MRN: [id number] DOB: 11/30/1965 Height:   Weight: 46.7 kg  Insurance Information HMO: yes HMO POS    PPO:      PCP:      IPA:      80/20:      OTHER:  PRIMARY: UHC Medicare      Policy#: [id number]  ; Medicare [id number]    Subscriber: pt CM Name: UM       Phone#: [phone] option 3     Fax#: [phone] Pre-Cert#: [id number]  Auth for CIR from [place] with [facility] Medicare for admit 04/30/24 with next review date 05/07/24.  Updates due to Um dept  at fax listed above.        Employer:  Benefits:  Phone #: [phone]     Name: 12/10 Eff. Date: 08/16/23 until 05/16/24     Deduct: none      Out of Pocket Max: $3900      Life Max: none CIR: $300 co pay per day days 1 until 5      SNF: no c copay days 1 until 20; $203 co pay per day days 21 until 100 Outpatient: $20 co pay per visit     Co-Pay:  Home Health: 100%      Co-Pay: visits limited by medical neccesity DME: 80%     Co-Pay: 20% Providers: in network  SECONDARY: none      Policy#:      Phone#:   Artist:       Phone#:  Daughter has tried to apply for [employer] but is Engineer, Petroleum for patients in Inpatient Rehabilitation Facilities with attached Privacy Act Statement-Health Care Records was provided and verbally reviewed with: Patient and Family  Emergency Contact Information Contact Information     Name Relation Home Work Mobile   [place] Daughter [phone]  [phone]   Mannie Callas Daughter [phone]  [phone]      Other Contacts     Name Relation Home Work Mobile   [place] Daughter   [phone]      Current Medical History  Patient Admitting Diagnosis: CVA  History of Present Illness: 58 yo female with historyof CAD s/p PCI, CVA ESRD on hemodialysis, HTN, HLD who presented on 12/8 with visual changes and aphasia. Has residual right  sided hemiparesis from prior CVA.   Imaging revealed acute tight midbrain infarct felt likely from small vessel disease. MRI acute tight dorsal midbrain infarct. CTA bilateral carotid plaque < 50 % stenosis. 2 d echo EF 65 to 70%. Heparin  SQ for VTE prophylaxis. Clopidogrel  prior to admit. Now on asa and Brilinta  for 30 days and then ASA indefinitely.   Hb A1c 8.5 currently on insulin . CBG monitoring and SSI. DM education and close PCP follow up. HTN on home amlodipine , coreg , hydralazine  and Imdur . LDL 30 on home Crestor .   Complete NIHSS TOTAL: 4  Patient's medical record from [facility] has been reviewed by the rehabilitation admission coordinator and physician.  Past Medical History  Past Medical History:  Diagnosis Date   CAD S/P PCI x 2 2009   MI 2009 -> PCI x 2 ([place]); 01/2016 Nuc ST Nonischemic with Normal EF   Cerebral infarction (HCC) 07/12/2017   2009 first one, has had a total of 4 stokes- last 2021   Chronic kidney disease    Hemo M-W- F   Diabetes type 2, controlled (HCC)  Hemiparesis affecting right side as late effect of cerebrovascular accident (CVA) (HCC)    Hyperlipidemia    Hypertension    Intractable vomiting with nausea 07/12/2017   Multiple sclerosis 2009   Myocardial infarction [facility]) 2009   Stroke [facility])    Has the patient had major surgery during 100 days prior to admission? No  Family History   family history is not on file.  Current Medications Current Medications[1]  Patients Current Diet:  Diet Order             DIET SOFT Room service appropriate? Yes; Fluid consistency: Thin  Diet effective now                  Precautions / Restrictions Precautions Precautions: Fall Precaution/Restrictions Comments: residual R hemiparesis Restrictions Weight Bearing Restrictions Per Provider Order: No   Has the patient had 2 or more falls or a fall with injury in the past year? No  Prior Activity Level Limited Community (1-2x/wk):  needed assist with short distance with RW and long distance w/c  Prior Functional Level Self Care: Did the patient need help bathing, dressing, using the toilet or eating? Needed some help  Indoor Mobility: Did the patient need assistance with walking from room to room (with or without device)? Needed some help  Stairs: Did the patient need assistance with internal or external stairs (with or without device)? Needed some help  Functional Cognition: Did the patient need help planning regular tasks such as shopping or remembering to take medications? Needed some help  Patient Information Are you of Hispanic, Latino/a,or Spanish origin?: A. No, not of Hispanic, Latino/a, or Spanish origin What is your race?: B. Black or African American Do you need or want an interpreter to communicate with a doctor or health care staff?: 0. No  Patient's Response To:  Health Literacy and Transportation Is the patient able to respond to health literacy and transportation needs?: Yes Health Literacy - How often do you need to have someone help you when you read instructions, pamphlets, or other written material from your doctor or pharmacy?: Sometimes In the past 12 months, has lack of transportation kept you from medical appointments or from getting medications?: No In the past 12 months, has lack of transportation kept you from meetings, work, or from getting things needed for daily living?: No  Home Assistive Devices / Equipment Home Equipment: Tub bench, Agricultural Consultant (2 wheels), BSC/3in1, Wheelchair - manual, [employer] - single point  Prior Device Use: Indicate devices/aids used by the patient prior to current illness, exacerbation or injury? Walker and RW short distances  Current Functional Level Cognition  Arousal/Alertness: Awake/alert Overall Cognitive Status: History of cognitive impairments - at baseline Orientation Level: Oriented X4 Attention: Sustained Sustained Attention: Appears  intact Memory: Appears intact (for 4 word recall) Awareness:  [facility]) Problem Solving: Appears intact (for basic in acute setting) Safety/Judgment:  (needs assist at baseline)    Extremity Assessment (includes Sensation/Coordination)  Upper Extremity Assessment: Right hand dominant, RUE deficits/detail, LUE deficits/detail RUE Deficits / Details: residual R hemiparesis from prior CVA, shoulder flexion ~3/5, elbow flexion/extension ~3+/5, grip strength ~3+/5 RUE Sensation: WNL RUE Coordination: decreased fine motor, decreased gross motor LUE Deficits / Details: grossly 4-/5 to MMT  Lower Extremity Assessment: Defer to PT evaluation RLE Deficits / Details: ROM WFL, strength grossly 3+/5 LLE Deficits / Details: ROM WFL, strength grossly 4-/5    ADLs  Overall ADL's : Needs assistance/impaired Eating/Feeding: Set up, Bed level Eating/Feeding Details (  indicate cue type and reason): observation, feeding self primarily with L hand Grooming: Minimal assistance Upper Body Bathing: Moderate assistance Lower Body Bathing: Total assistance Upper Body Dressing : Moderate assistance Lower Body Dressing: Moderate assistance, Sitting/lateral leans, Cueing for compensatory techniques, Cueing for sequencing Lower Body Dressing Details (indicate cue type and reason): seated eob with trunk support from therapist asst. pt. able to position herself in figure four with each LE,  initial portion of sock donned and pt.able to reach and finish pulling sock up with B hands. Toileting- Clothing Manipulation and Hygiene: Total assistance Toileting - Clothing Manipulation Details (indicate cue type and reason): purewick intact General ADL Comments: able to don socks with assistance, educatoin and review of rec. for use of B hands during adls to increase/maintain functional use of BUEs.    Mobility  Overal bed mobility: Needs Assistance Bed Mobility: Rolling, Sidelying to Sit, Sit to Sidelying Rolling: Min assist,  Used rails Sidelying to sit: Mod assist, HOB elevated, Used rails Supine to sit: Max assist, HOB elevated, Used rails Sit to sidelying: Mod assist, Used rails, HOB elevated General bed mobility comments: rolled to L in sidelying with use of BUES to reach for rail. mod a to guide trunk upright for sitting eob. back to bed able to transition into left sidelying with use of bed rail, assistance to guide BLEs into bed    Transfers  Overall transfer level: Needs assistance Equipment used: Rolling walker (2 wheels) Transfers: Sit to/from Stand Sit to Stand: Mod assist Bed to/from chair/wheelchair/BSC transfer type:: Stand pivot Stand pivot transfers: Mod assist General transfer comment: cues for hand placement with ModA to boost-up and steady. Posterior bias with frequent cueing to lean forwards. Cues for upright posture as pt tended to move into forward flexed position.    Ambulation / Gait / Stairs / Wheelchair Mobility  Ambulation/Gait Ambulation/Gait assistance: Mod assist Gait Distance (Feet): 2 Feet Assistive device: Rolling walker (2 wheels) Gait Pattern/deviations: Step-to pattern, Decreased step length - right, Decreased step length - left, Decreased stance time - right, Decreased stance time - left, Decreased dorsiflexion - right, Decreased dorsiflexion - left General Gait Details: able to sideways step towards [facility] with ModA for RW management and to correct posterior lean. Increased time and effort Gait velocity: decr Gait velocity interpretation: <1.31 ft/sec, indicative of household ambulator    Posture / Balance Dynamic Sitting Balance Sitting balance - Comments: intial L lean able to correct with verbal cues.  initiallly requiring ue support on bed rail, after LB dressing tasks noted balance improvment and pt. able to sit in un supported sitting w/o lean Balance Overall balance assessment: Needs assistance Sitting-balance support: Single extremity supported, Feet  unsupported Sitting balance-Leahy Scale: Poor Sitting balance - Comments: intial L lean able to correct with verbal cues.  initiallly requiring ue support on bed rail, after LB dressing tasks noted balance improvment and pt. able to sit in un supported sitting w/o lean Postural control: Posterior lean, Left lateral lean Standing balance support: Bilateral upper extremity supported, Reliant on assistive device for balance Standing balance-Leahy Scale: Poor Standing balance comment: heavily reliant on UE and external support. Posterior lean throughout with minimal correction with cueing    Special considerations/life events  Daughter transports her to hemodialysis Hgb A1c 8.5 OP HD MWF [place] 0715 chair time   Previous Home Environment  Living Arrangements:  (adult son and daughter. Dtr 71 and son 86 yo)  Lives With: Daughter Available Help at Discharge: Family, Available  24 hours/day Type of Home: Apartment Home Layout: One level Home Access: Stairs to enter Entrance Stairs-Rails: None Entrance Stairs-Number of Steps: threshold Bathroom Shower/Tub: Associate Professor: Yes How Accessible: Accessible via walker Home Care Services: No Additional Comments: Dtr states over 1 1/2 years, patient more dependent on her for adls, etc and she has taken the role on to be more efficient. DTr would like ot see her increase her funtional level  Discharge Living Setting Plans for Discharge Living Setting: Lives with (comment), Apartment (adult son and dtr) Type of Home at Discharge: Apartment Discharge Home Layout: One level Discharge Home Access: Stairs to enter Entrance Stairs-Rails: None Entrance Stairs-Number of Steps: threshold. sidewalk leading into apartment has 2 inclines Discharge Bathroom Shower/Tub: Tub/shower unit Discharge Bathroom Toilet: Standard Discharge Bathroom Accessibility: Yes How Accessible: Accessible via walker Does the  patient have any problems obtaining your medications?: No  Social/Family/Support Systems Patient Roles: Parent Contact Information: son and dtr Anticipated Caregiver: son and dtr Anticipated Caregiver's Contact Information: see contacts Ability/Limitations of Caregiver: dtr works at her church , but son with patietn when dtr works Engineer, Structural Availability: 24/7 Discharge Plan Discussed with Primary Caregiver: Yes Is Caregiver In Agreement with Plan?: Yes Does Caregiver/Family have Issues with Lodging/Transportation while Pt is in Rehab?: No  Goals Patient/Family Goal for Rehab: supervision to min PT and OT and supervision SLP Expected length of stay: ELOS 10 to 14 days Additional Information: Daughter transports her to OP hemodialysis Pt/Family Agrees to Admission and willing to participate: Yes Program Orientation Provided & Reviewed with Pt/Caregiver Including Roles  & Responsibilities: Yes Additional Information Needs: Has been to CIR twice in 2022 and went home  Decrease burden of Care through IP rehab admission: n/a  Possible need for SNF placement upon discharge: not anticipated  Patient Condition: This patient's condition remains as documented in the consult dated 04/25/24, in which the Rehabilitation Physician determined and documented that the patient's condition is appropriate for intensive rehabilitative care in an inpatient rehabilitation facility. Will admit to inpatient rehab today.  Preadmission Screen Completed By:  Leita KATHEE Kleine, RN MSN 04/30/2024 11:47 AM ______________________________________________________________________   Discussed status with Dr. Carilyn on 04/30/24 at 900 and received approval for admission today.  Admission Coordinator:  Leita KATHEE Kleine RAEJEAN MSN time 1147/Date 04/30/24   Assessment/Plan: Diagnosis: Right dorsal midbrain infarct Does the need for close, 24 hr/day Medical supervision in concert with the patient's rehab needs make it  unreasonable for this patient to be served in a less intensive setting? No Co-Morbidities requiring supervision/potential complications: DM2, ESRD on HD, HTN, chronic Sacroiliac and coccygeal pain  Due to bowel management, safety, skin/wound care, disease management, medication administration, pain management, and patient education, does the patient require 24 hr/day rehab nursing? Yes Does the patient require coordinated care of a physician, rehab nurse, PT, OT, and SLP to address physical and functional deficits in the context of the above medical diagnosis(es)? Yes Addressing deficits in the following areas: balance, endurance, locomotion, strength, transferring, bathing, dressing, toileting, and psychosocial support Can the patient actively participate in an intensive therapy program of at least 3 hrs of therapy 5 days a week? Yes The potential for patient to make measurable gains while on inpatient rehab is good Anticipated functional outcomes upon discharge from inpatient rehab: supervision and min assist PT, supervision and min assist OT, supervision SLP Estimated rehab length of stay to reach the above functional goals is: 10-14d Anticipated discharge destination: Home 10. Overall  Rehab/Functional Prognosis: good   MD Signature: Prentice CHARLENA Compton M.D. [facility] Fellow Am Acad of Phys Med and Rehab Diplomate Am Board of Electrodiagnostic Med Fellow Am Board of Interventional Pain      [1]  Current Facility-Administered Medications:    amLODipine  (NORVASC ) tablet 10 mg, 10 mg, Oral, Daily, Jerri Pfeiffer, MD, 10 mg at 04/29/24 0858   aspirin  EC tablet 81 mg, 81 mg, Oral, Daily, Khatri, Pardeep, MD, 81 mg at 04/29/24 0859   azaTHIOprine  (IMURAN ) tablet 50 mg, 50 mg, Oral, Daily, Dorrell, Robert, MD, 50 mg at 04/29/24 0859   calcium  acetate (PHOSLO ) capsule 667 mg, 667 mg, Oral, TID WC, Ejigiri, Ogechi Grace, PA-C, 667 mg at 04/29/24 1607   carvedilol  (COREG ) tablet 25  mg, 25 mg, Oral, BID WC, Dorrell, Robert, MD, 25 mg at 04/29/24 1608   Chlorhexidine  Gluconate Cloth 2 % PADS 6 each, 6 each, Topical, Q0600, Leotis Bogus, MD, 6 each at 04/30/24 0636   Chlorhexidine  Gluconate Cloth 2 % PADS 6 each, 6 each, Topical, Q0600, Collins, Samantha G, PA-C, 6 each at 04/30/24 0636   heparin  injection 5,000 Units, 5,000 Units, Subcutaneous, Q8H, Dorrell, Robert, MD, 5,000 Units at 04/30/24 9371   hydrALAZINE  (APRESOLINE ) tablet 50 mg, 50 mg, Oral, TID, Leotis, Pardeep, MD, 50 mg at 04/29/24 1607   HYDROcodone -acetaminophen  (NORCO/VICODIN) 5-325 MG per tablet 1 tablet, 1 tablet, Oral, Q12H PRN, Rathore, Vasundhra, MD, 1 tablet at 04/29/24 0858   insulin  aspart (novoLOG ) injection 0-5 Units, 0-5 Units, Subcutaneous, QHS, Khatri, Pardeep, MD, 2 Units at 04/29/24 2149   insulin  aspart (novoLOG ) injection 0-9 Units, 0-9 Units, Subcutaneous, TID WC, Leotis Bogus, MD, 2 Units at 04/30/24 9366   insulin  glargine (LANTUS ) injection 10 Units, 10 Units, Subcutaneous, Daily, Leotis, Pardeep, MD, 10 Units at 04/29/24 2150   isosorbide  mononitrate (IMDUR ) 24 hr tablet 30 mg, 30 mg, Oral, QHS, Dorrell, Robert, MD, 30 mg at 04/29/24 2155   melatonin tablet 3 mg, 3 mg, Oral, QHS PRN, Rathore, Vasundhra, MD, 3 mg at 04/25/24 0013   naloxone  (NARCAN ) injection 0.4 mg, 0.4 mg, Intravenous, PRN, Rathore, Vasundhra, MD   ondansetron  (ZOFRAN ) injection 4 mg, 4 mg, Intravenous, Q6H PRN, Perri DELENA Meliton Mickey., MD, 4 mg at 04/29/24 9142   Oral care mouth rinse, 15 mL, Mouth Rinse, PRN, Leotis, Pardeep, MD   polyethylene glycol (MIRALAX  / GLYCOLAX ) packet 17 g, 17 g, Oral, BID, Perri DELENA Meliton Mickey., MD   rosuvastatin  (CRESTOR ) tablet 20 mg, 20 mg, Oral, Daily, Dorrell, Robert, MD, 20 mg at 04/29/24 0859   ticagrelor  (BRILINTA ) tablet 90 mg, 90 mg, Oral, BID, Jerri Pfeiffer, MD, 90 mg at 04/29/24 2155

## 2024-04-26 NOTE — Progress Notes (Signed)
 PROGRESS NOTE    Hannah [name]  FMW:[id number] DOB: 22-Sep-1965 DOA: 04/23/2024 PCP: Delayne Artist PARAS, MD    Brief Narrative: This 58 yrs old female with PMH significant for CAD status post PCI, Prior CVA, ESRD on hemodialysis, hypertension, hyperlipidemia, who presented to the ED due to visual field changes and aphasia.  Patient last known well was 04/22/2024. She had a prior stroke and has residual right sided hemiparesis.  Daughter has noticed left-sided facial droop and decreased strength on the left side so she called the EMS.  Patient was found to be hemodynamically stable in the ED, Labs creatinine 3.5, potassium 3.4, CT head no acute strokes,  MRI brain demonstrated acute infarct in the right dorsal midbrain.  Neurology was consulted and patient was admitted for stroke workup.  Assessment & Plan:   Principal Problem:   CVA (cerebral vascular accident) (HCC) Active Problems:   Acute hypoxic respiratory failure (HCC)   Acute CVA (cerebrovascular accident) (HCC)  Acute Right midbrain infarct: Patient presented with left-sided facial droop with decreased strength on the left side.   CT head no acute stroke noted MRI brain demonstrated acute infarct in the right dorsal midbrain. She is out of tPA window. Neurology consult appreciated.  Recommended complete stroke workup. Echo shows LVEF 65 to 70%, no RWMA. CTA Head /Neck  > 50% stenosis bilaterally LDL under 30 under goal, hemoglobin A1c 8.2.,  P2Y12 = 229 Recommend 30 days of aspirin  and Brilinta  and then aspirin  alone daily indefinitely. Continue Crestor . PT and OT recommended CIR.  Hyperlipidemia: Continue rosuvastatin  20 mg daily .  LDL 30.  Hypertension: Gradually normalize BP in 2 to 3 days. Continue hydralazine ,  Imdur , and Coreg  and amlodipine .  CAD status post PCI: Continue brilanta,  Crestor  and carvedilol .  ESRD on hemodialysis: Patient has no urgent acute indication for dialysis. Nephrology consulted for  continuation of hemodialysis. She had hemodialysis very late last night.  Remains tired in the morning. Continue dialysis as per nephrology.  Chorioretinitis Continue azithroprine.  Diabetes mellitus type 2 Uncontrolled, hemoglobin A1c 8.5. Continue Semglee  10 units daily Continue regular insulin  sliding scale.  DVT prophylaxis: Heparin  subcu Code Status: Full code Family Communication: Daughter at bedside. Disposition Plan:    Status is: Inpatient Remains inpatient appropriate because: Admitted for stroke workup.  Patient not medically ready for discharge.    Consultants:  Neurology Nephrology.  Procedures:CT head, MRI   Antimicrobials:  Anti-infectives (From admission, onward)    None       Subjective: Patient was seen and examined at bedside. Overnight events noted. Patient is alert and oriented,  following commands.  Has dialysis very late last night. She reports feeling tired .  Objective: Vitals:   04/26/24 0230 04/26/24 0513 04/26/24 0716 04/26/24 1103  BP: (!) 144/73 (!) 173/71 (!) 169/71 (!) 185/66  Pulse: 87 82 89 83  Resp: 11 18 16 10   Temp: 98.5 F (36.9 C) 98.6 F (37 C) 98.6 F (37 C) 98.3 F (36.8 C)  TempSrc: Oral Oral  Oral  SpO2: 100% 100% 100% 100%  Weight: 44.3 kg       Intake/Output Summary (Last 24 hours) at 04/26/2024 1513 Last data filed at 04/26/2024 0600 Gross per 24 hour  Intake 500 ml  Output 2000 ml  Net -1500 ml   Filed Weights   04/24/24 0200 04/25/24 2315 04/26/24 0230  Weight: 46.3 kg 46.3 kg 44.3 kg    Examination:  General exam: Appears calm and comfortable, deconditioned, not in  any acute distress. Respiratory system: CTA Bilaterally. Respiratory effort normal.  RR 15 Cardiovascular system: S1 & S2 heard, RRR. No JVD, murmurs, rubs, gallops or clicks.  Gastrointestinal system: Abdomen is non distended, soft and non tender. Normal bowel sounds heard. Central nervous system: Alert and oriented x 3.  Right-sided  weakness from prior stroke. Extremities: No edema, no cyanosis, no clubbing. Skin: No rashes, lesions or ulcers Psychiatry: Judgement and insight appear normal. Mood & affect appropriate.     Data Reviewed: I have personally reviewed following labs and imaging studies  CBC: Recent Labs  Lab 04/23/24 1344 04/25/24 2349  WBC 3.0* 4.0  NEUTROABS 1.6*  --   HGB 11.8* 10.3*  HCT 37.8 32.3*  MCV 96.7 96.7  PLT 163 173   Basic Metabolic Panel: Recent Labs  Lab 04/23/24 1344 04/25/24 2349  NA 137 136  K 3.4* 4.8  CL 97* 98  CO2 29 29  GLUCOSE 184* 141*  BUN 9 33*  CREATININE 3.51* 6.93*  CALCIUM  8.0* 8.6*  PHOS  --  5.7*   GFR: Estimated Creatinine Clearance: 6 mL/min (A) (by C-G formula based on SCr of 6.93 mg/dL (H)). Liver Function Tests: Recent Labs  Lab 04/23/24 1344 04/25/24 2349  AST 15  --   ALT 8  --   ALKPHOS 48  --   BILITOT 0.9  --   PROT 7.2  --   ALBUMIN 3.3* 3.0*   No results for input(s): LIPASE, AMYLASE in the last 168 hours. No results for input(s): AMMONIA in the last 168 hours. Coagulation Profile: Recent Labs  Lab 04/23/24 1344  INR 1.1   Cardiac Enzymes: No results for input(s): CKTOTAL, CKMB, CKMBINDEX, TROPONINI in the last 168 hours. BNP (last 3 results) No results for input(s): PROBNP in the last 8760 hours. HbA1C: Recent Labs    04/24/24 0456  HGBA1C 8.5*   CBG: Recent Labs  Lab 04/25/24 1117 04/25/24 1631 04/25/24 2109 04/26/24 0614 04/26/24 1118  GLUCAP 250* 258* 174* 94 170*   Lipid Profile: Recent Labs    04/24/24 0456  CHOL 111  HDL 72  LDLCALC 30  TRIG 44  CHOLHDL 1.5   Thyroid  Function Tests: No results for input(s): TSH, T4TOTAL, FREET4, T3FREE, THYROIDAB in the last 72 hours. Anemia Panel: No results for input(s): VITAMINB12, FOLATE, FERRITIN, TIBC, IRON, RETICCTPCT in the last 72 hours. Sepsis Labs: No results for input(s): PROCALCITON, LATICACIDVEN in  the last 168 hours.  No results found for this or any previous visit (from the past 240 hours).   Radiology Studies: No results found.  Scheduled Meds:  amLODipine   10 mg Oral Daily   aspirin  EC  81 mg Oral Daily   azaTHIOprine   50 mg Oral Daily   calcium  acetate  667 mg Oral TID WC   carvedilol   25 mg Oral BID WC   Chlorhexidine  Gluconate Cloth  6 each Topical Q0600   heparin  injection (subcutaneous)  5,000 Units Subcutaneous Q8H   hydrALAZINE   50 mg Oral TID   insulin  aspart  0-9 Units Subcutaneous TID WC   insulin  glargine  10 Units Subcutaneous Daily   isosorbide  mononitrate  30 mg Oral QHS   rosuvastatin   20 mg Oral Daily   ticagrelor   90 mg Oral BID   Continuous Infusions:   LOS: 3 days    Time spent: 35 Mins    Darcel Dawley, MD [facility]   If 7PM-7AM, please contact night-coverage

## 2024-04-26 NOTE — Progress Notes (Signed)
° °  Inpatient Rehabilitation Admissions Coordinator   I await insurance determination for possible CIR admit.  Heron Leavell, RN, MSN Rehab Admissions Coordinator [phone] 04/26/2024 10:42 AM

## 2024-04-26 NOTE — Progress Notes (Signed)
°  [name] Progress Note   Subjective:  Completed dialysis overnight- net UF 2L.  Did have prolonged bleeding from graft after HD.  No complaints this am. Denies cp, sob   Objective Vitals:   04/26/24 0200 04/26/24 0230 04/26/24 0513 04/26/24 0716  BP: (!) 158/72 (!) 144/73 (!) 173/71 (!) 169/71  Pulse: 82 87 82 89  Resp: 11 11 18 16   Temp:  98.5 F (36.9 C) 98.6 F (37 C) 98.6 F (37 C)  TempSrc:  Oral Oral   SpO2: 100% 100% 100% 100%  Weight:  44.3 kg      Additional Objective Labs: Basic Metabolic Panel: Recent Labs  Lab 04/23/24 1344 04/25/24 2349  NA 137 136  K 3.4* 4.8  CL 97* 98  CO2 29 29  GLUCOSE 184* 141*  BUN 9 33*  CREATININE 3.51* 6.93*  CALCIUM  8.0* 8.6*  PHOS  --  5.7*   CBC: Recent Labs  Lab 04/23/24 1344 04/25/24 2349  WBC 3.0* 4.0  NEUTROABS 1.6*  --   HGB 11.8* 10.3*  HCT 37.8 32.3*  MCV 96.7 96.7  PLT 163 173   Blood Culture No results found for: SDES, SPECREQUEST, CULT, REPTSTATUS   Physical Exam General: Alert, nad, dysarthric speech Heart: RRR Lungs: Clear  Abdomen: non-tender Extremities: no LE edema  Dialysis Access: RUE AVG +bruit   Medications:   amLODipine   10 mg Oral Daily   aspirin  EC  81 mg Oral Daily   azaTHIOprine   50 mg Oral Daily   calcium  acetate  667 mg Oral TID WC   carvedilol   25 mg Oral BID WC   Chlorhexidine  Gluconate Cloth  6 each Topical Q0600   heparin  injection (subcutaneous)  5,000 Units Subcutaneous Q8H   hydrALAZINE   25 mg Oral BID   insulin  aspart  0-6 Units Subcutaneous TID WC   insulin  glargine  10 Units Subcutaneous Daily   isosorbide  mononitrate  30 mg Oral QHS   rosuvastatin   20 mg Oral Daily   ticagrelor   90 mg Oral BID    Dialysis Orders:  AF MWF 3:30 BFR 350 2K/2Ca AVG 16g No heparin   -No ESA  -Hectorol  1 mcg q HD   Assessment/Plan: Acute CVA. H/o prior strokes. 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 Management per neuro. Being evaluated for CIR.   ESRD.  HD MWF. Continue per schedule.  Prolonged bleeding from AVG yesterday. Getting sub Q heparin  here. Follow. May need vascular consult if reoccurs.  Hypertension. Home meds resumed. Remains hypertensive.  Volume. Not grossly overloaded. UF as able  Anemia. Hgb 11.8. No ESA needs.  Metabolic bone disease.  Follow Ca/Phos here. Continue Hectorol , Phoslo  binder  T2DM. Insulin  per primary   Hannah Ronnald Acosta PA-C [facility] 04/26/2024,9:36 AM

## 2024-04-26 NOTE — Care Management Important Message (Signed)
 Important Message  Patient Details  Name: [name] MRN: [id number] Date of Birth: 06/06/1965   Important Message Given:  Yes - Medicare IM     Claretta Deed 04/26/2024, 3:05 PM

## 2024-04-27 LAB — CBC
HCT: 31 % — ABNORMAL LOW (ref 36.0–46.0)
Hemoglobin: 10 g/dL — ABNORMAL LOW (ref 12.0–15.0)
MCH: 30.7 pg (ref 26.0–34.0)
MCHC: 32.3 g/dL (ref 30.0–36.0)
MCV: 95.1 fL (ref 80.0–100.0)
Platelets: 176 K/uL (ref 150–400)
RBC: 3.26 MIL/uL — ABNORMAL LOW (ref 3.87–5.11)
RDW: 16.3 % — ABNORMAL HIGH (ref 11.5–15.5)
WBC: 4.5 K/uL (ref 4.0–10.5)
nRBC: 0 % (ref 0.0–0.2)

## 2024-04-27 LAB — GLUCOSE, CAPILLARY
Glucose-Capillary: 250 mg/dL — ABNORMAL HIGH (ref 70–99)
Glucose-Capillary: 168 mg/dL — ABNORMAL HIGH (ref 70–99)
Glucose-Capillary: 239 mg/dL — ABNORMAL HIGH (ref 70–99)
Glucose-Capillary: 79 mg/dL (ref 70–99)
Glucose-Capillary: 131 mg/dL — ABNORMAL HIGH (ref 70–99)
Glucose-Capillary: 195 mg/dL — ABNORMAL HIGH (ref 70–99)

## 2024-04-27 LAB — BASIC METABOLIC PANEL WITH GFR
Anion gap: 5 (ref 5–15)
BUN: 25 mg/dL — ABNORMAL HIGH (ref 6–20)
CO2: 31 mmol/L (ref 22–32)
Calcium: 8.5 mg/dL — ABNORMAL LOW (ref 8.9–10.3)
Chloride: 97 mmol/L — ABNORMAL LOW (ref 98–111)
Creatinine, Ser: 5.95 mg/dL — ABNORMAL HIGH (ref 0.44–1.00)
GFR, Estimated: 8 mL/min — ABNORMAL LOW (ref >60)
Glucose, Bld: 242 mg/dL — ABNORMAL HIGH (ref 70–99)
Potassium: 4.8 mmol/L (ref 3.5–5.1)
Sodium: 133 mmol/L — ABNORMAL LOW (ref 135–145)

## 2024-04-27 LAB — MAGNESIUM: Magnesium: 2.3 mg/dL (ref 1.7–2.4)

## 2024-04-27 LAB — PHOSPHORUS: Phosphorus: 4.5 mg/dL (ref 2.5–4.6)

## 2024-04-27 MED ORDER — ANTICOAGULANT SODIUM CITRATE 4% (200MG/5ML) IV SOLN: 5.0000 mL | Status: DC | PRN

## 2024-04-27 MED ORDER — PENTAFLUOROPROP-TETRAFLUOROETH EX AERO: 1.0000 | INHALATION_SPRAY | CUTANEOUS | Status: DC | PRN

## 2024-04-27 MED ORDER — INSULIN ASPART 100 UNIT/ML IJ SOLN
0.0000 [IU] | Freq: Every day | INTRAMUSCULAR | Status: DC
  Administered 2024-04-29: 2 [IU] via SUBCUTANEOUS
  Filled 2024-04-27: qty 2

## 2024-04-27 MED ORDER — ALTEPLASE 2 MG IJ SOLR: 2.0000 mg | Freq: Once | INTRAMUSCULAR | Status: DC | PRN

## 2024-04-27 MED ORDER — LIDOCAINE HCL (PF) 1 % IJ SOLN: 5.0000 mL | INTRAMUSCULAR | Status: DC | PRN

## 2024-04-27 MED ORDER — INSULIN ASPART 100 UNIT/ML IJ SOLN: 0.0000 [IU] | Freq: Three times a day (TID) | INTRAMUSCULAR | Status: DC

## 2024-04-27 MED ORDER — HEPARIN SODIUM (PORCINE) 1000 UNIT/ML DIALYSIS: 1000.0000 [IU] | INTRAMUSCULAR | Status: DC | PRN

## 2024-04-27 MED ORDER — LIDOCAINE-PRILOCAINE 2.5-2.5 % EX CREA: 1.0000 | TOPICAL_CREAM | CUTANEOUS | Status: DC | PRN

## 2024-04-27 NOTE — Progress Notes (Signed)
 OT Cancellation Note  Patient Details Name: [name] MRN: [id number] DOB: [birth year]   Cancelled Treatment:    Reason Eval/Treat Not Completed: Patient at procedure or test/ unavailable (Patient off unit in HD, OT to reattempt as schedule permits)  Jeb LITTIE Laine 04/27/2024, 12:32 PM  Dick Laine, OTA Acute Rehabilitation Services  Office [phone]

## 2024-04-27 NOTE — Progress Notes (Signed)
° °  Inpatient Rehabilitation Admissions Coordinator   I await insurance approval for possible CIR admit.  Heron Leavell, RN, MSN Rehab Admissions Coordinator [phone] 04/27/2024 11:13 AM

## 2024-04-27 NOTE — H&P (Shared)
 Physical Medicine and Rehabilitation Admission H&P    Chief Complaint  Patient presents with   Functional deficits due to new stroke and history of prior stroke with right sided weakness    HPI:  Hannah [name] is a 58 year old RH-female with history of CVA with chronic right sided weakness, CAD s/p PCI, HTN, MS, chorioretinitis, chronic LBP, T2DM- HD MWF wjp was admitted on 04/23/24 with onset peripheral vision loss, left facial weakness with left sided weakness and aphasia. MRI brain done revealing acute infarct right dorsal midbrain and multiple scattered chronic microhemorrhages particularly within the thalami and brain stem. CTA  head/neck negative for  intracranial disease or stenosis.  2 D echo showed EF 65-70% with no wall abnormalities and small pericardial effusion. Dr. Jerri felt that stroke was due to small vessel disease and Plavix  changed to Brillinta and ASA added with recommendations to continue for 30 days then change to ASA 81 mg indefinitely.  P2Y12 level 229 (normal).  HD on going MWF and she has had some issues with prolonged bleeding from AVG site. Anemia being monitored and BP control improving. PT/OT/ST has been working with patient who requires mod to max assist with ADLs with cues to compensated for left lean, dysarthria is at baseline and requires mod assist for mobility and able to take a couple of steps. At baseline she was independent at wheelchair level and was able to walk a few steps with use of walker. CIR recommended due to functional decline.     Review of Systems  Constitutional:  Negative for fever.  HENT:  Negative for hearing loss.   Eyes:  Positive for blurred vision. Negative for pain.       Vision worse since this stroke  Respiratory:  Negative for cough.   Cardiovascular:  Positive for chest pain (during HD--managed with adjusting rate).  Gastrointestinal:  Positive for constipation.  Neurological:  Positive for weakness.  Psychiatric/Behavioral:   The patient has insomnia.      Past Medical History:  Diagnosis Date   CAD S/P PCI x 2 2009   MI 2009 -> PCI x 2 ([place]); 01/2016 Nuc ST Nonischemic with Normal EF   Cerebral infarction (HCC) 07/12/2017   2009 first one, has had a total of 4 stokes- last 2021   Chronic kidney disease    Hemo M-W- F   Diabetes type 2, controlled (HCC)    Hemiparesis affecting right side as late effect of cerebrovascular accident (CVA) (HCC)    Hyperlipidemia    Hypertension    Intractable vomiting with nausea 07/12/2017   Multiple sclerosis 2009   Myocardial infarction [facility]) 2009   Stroke [facility])     Past Surgical History:  Procedure Laterality Date   A/V FISTULAGRAM Right 02/12/2021   Procedure: A/V FISTULAGRAM;  Surgeon: Gretta Lonni PARAS, MD;  [facility]: [facility] INVASIVE CV LAB;  Service: Cardiovascular;  Laterality: Right;   A/V FISTULAGRAM Right 05/20/2022   Procedure: A/V Fistulagram;  Surgeon: Gretta Lonni PARAS, MD;  Location: [facility] INVASIVE CV LAB;  Service: Cardiovascular;  Laterality: Right;   A/V FISTULAGRAM N/A 05/05/2023   Procedure: A/V Fistulagram;  Surgeon: Melia Lynwood ORN, MD;  Location: [facility] INVASIVE CV LAB;  Service: Cardiovascular;  Laterality: N/A;   A/V SHUNT INTERVENTION N/A 08/16/2023   Procedure: A/V SHUNT INTERVENTION;  Surgeon: Tobie Gordy POUR, MD;  Location: [facility] INVASIVE CV LAB;  Service: Cardiovascular;  Laterality: N/A;   A/V SHUNTOGRAM Right 09/26/2023   Procedure: A/V SHUNTOGRAM;  Surgeon: Pearline Norman RAMAN, MD;  Location: [facility] INVASIVE CV LAB;  Service: Cardiovascular;  Laterality: Right;   APPENDECTOMY     AV FISTULA PLACEMENT Right 10/02/2020   Procedure: RIGHT ARTERIOVENOUS (AV) FISTULA CREATION;  Surgeon: Sheree Penne Bruckner, MD;  Location: [facility] OR;  Service: Vascular;  Laterality: Right;   AV FISTULA PLACEMENT Right 05/28/2022   Procedure: RIGHT ARM ARTERIOVENOUS (AV) FISTULA REVISION WITH GORETEX GRAFT;  Surgeon: Gretta Bruckner PARAS, MD;  Location: [facility] OR;  Service:  Vascular;  Laterality: Right;   BASCILIC VEIN TRANSPOSITION Right 11/24/2020   Procedure: SECOND STAGE RIGHT ARM BASCILIC VEIN TRANSPOSITION;  Surgeon: Gretta Bruckner PARAS, MD;  Location: [facility] OR;  Service: Vascular;  Laterality: Right;   CYSTOSTOMY W/ BLADDER BIOPSY     INSERTION OF DIALYSIS CATHETER Right 05/28/2022   Procedure: INSERTION OF TUNNELED DIALYSIS CATHETER IN THE RIGHT INTERNAL JUGULAR;  Surgeon: Gretta Bruckner PARAS, MD;  Location: [facility] OR;  Service: Vascular;  Laterality: Right;   IR FLUORO GUIDE CV LINE RIGHT  09/29/2020   IR US  GUIDE VASC ACCESS RIGHT  09/29/2020   NM MYOVIEW  LTD  12/25/2015   [place], [place] - NORMAL /NEGATIVE for ischemia. Normal LV Fxn   OVARIAN CYST SURGERY     PERCUTANEOUS CORONARY STENT INTERVENTION (PCI-S)  2009   [place], [place] - 2 stents in settng of MI -- unknown details   PERIPHERAL VASCULAR BALLOON ANGIOPLASTY Right 02/12/2021   Procedure: PERIPHERAL VASCULAR BALLOON ANGIOPLASTY;  Surgeon: Gretta Bruckner PARAS, MD;  [facility];  Service: Cardiovascular;  Laterality: Right;  UPPER ARM FISTULA   PERIPHERAL VASCULAR BALLOON ANGIOPLASTY  05/20/2022   Procedure: PERIPHERAL VASCULAR BALLOON ANGIOPLASTY;  Surgeon: Gretta Bruckner PARAS, MD;  [facility];  Service: Cardiovascular;;  rt arm fistula site   PERIPHERAL VASCULAR BALLOON ANGIOPLASTY Right 01/28/2022   Procedure: PERIPHERAL VASCULAR BALLOON ANGIOPLASTY;  Surgeon: Gretta Bruckner PARAS, MD;  [facility];  Service: Cardiovascular;  Laterality: Right;  Arm fistula   PERIPHERAL VASCULAR BALLOON ANGIOPLASTY  05/05/2023   Procedure: PERIPHERAL VASCULAR BALLOON ANGIOPLASTY;  Surgeon: Melia Lynwood ORN, MD;  [facility] INVAS[facility] CV LAB;  Service: Cardiovascular;;  RUA AVF   TRANSTHORACIC ECHOCARDIOGRAM  07/11/2017   a) 12/2015, [place], [place]): Moderate Conc LVH w/ sigmoid septum.  No LVOT obstruction.  Hyperdynamic LV fxn - EF 65 to 70%.  GR 1 DD & high LAP/LVEDP.   AoV Sclerosis - No AS;; b) 06/2017: [facility]): Normal LV Fxn - EF 60%.  Abnormal septal motion.  Mild LVH mild MAC.  Negative bubble study (for CVA)   TRANSTHORACIC ECHOCARDIOGRAM  12/2020   a) 05/2020: EF 60-65%. No RWMA. Gr2DD. Sm posterior pericardial effusion. No AS.; b) 09/2020: EF 70-75%.  Hyperdynamic.  No RWMA. ? D Fxn/LAP.  Mildly elevated PAP.  Pericardial effusion.  Mild ao V sclerosis cyst.; c) 12/2020 (Limited): EF 65-70%. No RWMA. Gr1-2 DD. Mild Conc LVH- high LAP. Nl PAP. Circumferential effusion w/o tamponade. AoV Sclerosis - no AS. Nl RAP.   ULTRASOUND GUIDANCE FOR VASCULAR ACCESS Right 05/28/2022   Procedure: ULTRASOUND GUIDANCE FOR VASCULAR ACCESS;  Surgeon: Gretta Bruckner PARAS, MD;  Location: [facility] OR;  Service: Vascular;  Laterality: Right;   VENOUS ANGIOPLASTY Right 08/16/2023   Procedure: VENOUS ANGIOPLASTY;  Surgeon: Tobie Gordy POUR, MD;  Location: [facility] INVASIVE CV LAB;  Service: Cardiovascular;  Laterality: Right;  70% Innominate; 70% Outflow Basilic    History reviewed. No pertinent family history.  Social History:  reports that she has never smoked. She has never used smokeless tobacco. She reports that she does not currently use alcohol. She reports that she does not use drugs.    Allergies[1]   Medications Prior to Admission  Medication Sig Dispense Refill   acetaminophen  (TYLENOL ) 500 MG tablet Take 500 mg by mouth every 6 (six) hours as needed for mild pain (pain score 1-3), moderate pain (pain score 4-6), fever or headache.     amLODipine  (NORVASC ) 10 MG tablet Take 1 tablet (10 mg total) by mouth daily. Except on dialysis days do not take a dose. 30 tablet 0   [EXPIRED] azaTHIOprine  (IMURAN ) 50 MG tablet Take 50 mg by mouth daily.     calcium  acetate (PHOSLO ) 667 MG capsule Take 667 mg by mouth in the morning, at noon, and at bedtime.     carvedilol  (COREG ) 25 MG tablet Take 1 tablet (25 mg total) by mouth 2 (two) times daily with a meal. And as  directed. 60 tablet 0   clopidogrel  (PLAVIX ) 75 MG tablet Take 1 tablet (75 mg total) by mouth daily. 30 tablet 0   diclofenac  Sodium (VOLTAREN ) 1 % GEL Apply 2 g topically 4 (four) times daily. To leg (Patient taking differently: Apply 2 g topically 4 (four) times daily as needed (pain). To leg) 350 g 0   famotidine  (PEPCID ) 20 MG tablet Take 1 tablet (20 mg total) by mouth daily. 30 tablet 0   hydrALAZINE  (APRESOLINE ) 25 MG tablet Take 1 tablet (25 mg total) by mouth 2 (two) times daily. 180 tablet 3   HYDROcodone -acetaminophen  (NORCO/VICODIN) 5-325 MG tablet Take 1 tablet by mouth every 12 (twelve) hours as needed for moderate pain (pain score 4-6) or severe pain (pain score 7-10).     insulin  degludec (TRESIBA  FLEXTOUCH) 100 UNIT/ML FlexTouch Pen Inject 12 Units into the skin at bedtime. (Patient taking differently: Inject 8-10 Units into the skin at bedtime.) 15 mL 0   insulin  lispro (HUMALOG ) 100 UNIT/ML KwikPen Inject 5 Units into the skin 3 (three) times daily. (Patient taking differently: Inject 4-6 Units into the skin 3 (three) times daily.) 15 mL 0   isosorbide  mononitrate (IMDUR ) 30 MG 24 hr tablet Take 1 tablet (30 mg total) by mouth at bedtime. 90 tablet 3   lidocaine -prilocaine  (EMLA ) cream Apply 1 Application topically daily as needed (port access).     metoCLOPramide  (REGLAN ) 5 MG tablet Take 5 mg by mouth every 6 (six) hours as needed for nausea or vomiting.     naloxone  (NARCAN ) nasal spray 4 mg/0.1 mL Place 0.4 mg into the nose as needed.     nitroGLYCERIN  (NITROSTAT ) 0.4 MG SL tablet Place 1 tablet (0.4 mg total) under the tongue every 5 (five) minutes as needed for chest pain. 30 tablet 12   ondansetron  (ZOFRAN ) 4 MG tablet Take 4 mg by mouth every 8 (eight) hours as needed for nausea or vomiting.     polyethylene glycol (MIRALAX  / GLYCOLAX ) 17 g packet Take 17 g by mouth daily. (Patient taking differently: Take 17 g by mouth daily as needed for moderate constipation.) 30 each 0    rosuvastatin  (CRESTOR ) 20 MG tablet Take 20 mg by mouth daily.     senna-docusate (SENOKOT-S) 8.6-50 MG tablet Take 1 tablet by mouth 2 (two) times daily as needed for moderate constipation. (Patient taking differently: Take 1 tablet by mouth 2 (two) times daily as needed for mild constipation or moderate constipation.) 60 tablet 0  Continuous Blood Gluc Sensor (FREESTYLE LIBRE 2 SENSOR) MISC USE AS DIRECTED CHANGE EVERY 14 DAYS       Home: Home Living Family/patient expects to be discharged to:: Private residence Living Arrangements:  (adult son and daughter. Dtr 55 and son 24 yo) Available Help at Discharge: Family, Available 24 hours/day Type of Home: Apartment Home Access: Stairs to enter [employer] of Steps: threshold Entrance Stairs-Rails: None Home Layout: One level Bathroom Shower/Tub: Engineer, Manufacturing Systems: Standard Bathroom Accessibility: Yes Home Equipment: Tub bench, Agricultural Consultant (2 wheels), BSC/3in1, Wheelchair - manual, Cane - single point Additional Comments: Dtr states over 1 1/2 years, patient more dependent on her for adls, etc and she has taken the role on to be more efficient. DTr would like ot see her increase her funtional level  Lives With: Daughter   Functional History: Prior Function Prior Level of Function : Needs assist Mobility Comments: son or daughter assist the pt in stand pivot transfers to her manual wheelchair. Pt's MWC does not fit in the bathroom so she walks with min assistance for ~5-10' to reach the commode or tub ADLs Comments: family assisting with bathing and dressing but daughter feels like pt has potential to be able to do more. family assists with IADLs  Functional Status:  Mobility: Bed Mobility Overal bed mobility: Needs Assistance Bed Mobility: Rolling, Sidelying to Sit, Sit to Sidelying Rolling: Min assist, Used rails Sidelying to sit: Mod assist, HOB elevated, Used rails Supine to sit: Max assist, HOB  elevated, Used rails Sit to sidelying: Mod assist, Used rails, HOB elevated General bed mobility comments: cues for log roll technique with pt able to reach for rail with L hand while protecting R UE. Assist to hook L ankle underneath R LE with slight assist to bring off EOB and to raise trunk. ModA for return to sidelying for LE management Transfers Overall transfer level: Needs assistance Equipment used: Rolling walker (2 wheels) Transfers: Sit to/from Stand Sit to Stand: Mod assist Bed to/from chair/wheelchair/BSC transfer type:: Stand pivot Stand pivot transfers: Mod assist General transfer comment: cues for hand placement with ModA to boost-up and steady. Posterior bias with frequent cueing to lean forwards. Cues for upright posture as pt tended to move into forward flexed position. Ambulation/Gait Ambulation/Gait assistance: Mod assist Gait Distance (Feet): 2 Feet Assistive device: Rolling walker (2 wheels) Gait Pattern/deviations: Step-to pattern, Decreased step length - right, Decreased step length - left, Decreased stance time - right, Decreased stance time - left, Decreased dorsiflexion - right, Decreased dorsiflexion - left General Gait Details: able to sideways step towards [facility] with ModA for RW management and to correct posterior lean. Increased time and effort Gait velocity: decr Gait velocity interpretation: <1.31 ft/sec, indicative of household ambulator    ADL: ADL Overall ADL's : Needs assistance/impaired Eating/Feeding: Set up, Bed level Eating/Feeding Details (indicate cue type and reason): observation, feeding self primarily with L hand Grooming: Minimal assistance Upper Body Bathing: Moderate assistance Lower Body Bathing: Total assistance Upper Body Dressing : Moderate assistance Lower Body Dressing: Total assistance Lower Body Dressing Details (indicate cue type and reason): adjusting socks Toileting- Clothing Manipulation and Hygiene: Total  assistance Toileting - Clothing Manipulation Details (indicate cue type and reason): purewick intact  Cognition: Cognition Overall Cognitive Status: History of cognitive impairments - at baseline Arousal/Alertness: Awake/alert Orientation Level: Oriented X4 Month: December Day of Week: Correct Attention: Sustained Sustained Attention: Appears intact Memory: Appears intact (for 4 word recall) Awareness:  [facility]) Problem Solving: Appears  intact (for basic in acute setting) Safety/Judgment:  (needs assist at baseline) Cognition Arousal: Alert Behavior During Therapy: WFL for tasks assessed/performed Overall Cognitive Status: History of cognitive impairments - at baseline   Blood pressure 126/64, pulse 78, temperature 98.2 F (36.8 C), resp. rate 11, weight 44.3 kg, SpO2 100%. Physical Exam Vitals and nursing note reviewed.  Cardiovascular:     Heart sounds: Murmur heard.  Musculoskeletal:     Comments: BLE with muscle wasting. Tight heel cords on right.   Neurological:     Mental Status: She is alert and oriented to person, place, and time.     Comments: Clear but ataxic speech. Right sided weakness with mild right facial weakness. She was able to follow simple motor commands. Decrease in fine motor movement RUE and contracture right elbow.      Results for orders placed or performed during the hospital encounter of 04/23/24 (from the past 48 hours)  Glucose, capillary     Status: Abnormal   Collection Time: 04/25/24  4:31 PM  Result Value Ref Range   Glucose-Capillary 258 (H) 70 - 99 mg/dL    Comment: Glucose reference range applies only to samples taken after fasting for at least 8 hours.  Glucose, capillary     Status: Abnormal   Collection Time: 04/25/24  9:09 PM  Result Value Ref Range   Glucose-Capillary 174 (H) 70 - 99 mg/dL    Comment: Glucose reference range applies only to samples taken after fasting for at least 8 hours.   Comment 1 Notify RN    Comment 2  Document in Chart   Renal function panel     Status: Abnormal   Collection Time: 04/25/24 11:49 PM  Result Value Ref Range   Sodium 136 135 - 145 mmol/L   Potassium 4.8 3.5 - 5.1 mmol/L   Chloride 98 98 - 111 mmol/L   CO2 29 22 - 32 mmol/L   Glucose, Bld 141 (H) 70 - 99 mg/dL    Comment: Glucose reference range applies only to samples taken after fasting for at least 8 hours.   BUN 33 (H) 6 - 20 mg/dL   Creatinine, Ser 3.06 (H) 0.44 - 1.00 mg/dL    Comment: DELTA CHECK NOTED   Calcium  8.6 (L) 8.9 - 10.3 mg/dL   Phosphorus 5.7 (H) 2.5 - 4.6 mg/dL   Albumin 3.0 (L) 3.5 - 5.0 g/dL   GFR, Estimated 6 (L) >60 mL/min    Comment: (NOTE) Calculated using the CKD-EPI Creatinine Equation (2021)    Anion gap 9 5 - 15    Comment: Performed at [facility], [address] [place], [place] [address]  CBC     Status: Abnormal   Collection Time: 04/25/24 11:49 PM  Result Value Ref Range   WBC 4.0 4.0 - 10.5 K/uL   RBC 3.34 (L) 3.87 - 5.11 MIL/uL   Hemoglobin 10.3 (L) 12.0 - 15.0 g/dL   HCT 67.6 (L) 63.9 - 53.9 %   MCV 96.7 80.0 - 100.0 fL   MCH 30.8 26.0 - 34.0 pg   MCHC 31.9 30.0 - 36.0 g/dL   RDW 83.7 (H) 88.4 - 84.4 %   Platelets 173 150 - 400 K/uL   nRBC 0.0 0.0 - 0.2 %    Comment: Performed at [facility], [address] [place], [place] [address]  Glucose, capillary     Status: None   Collection Time: 04/26/24  6:14 AM  Result Value  Ref Range   Glucose-Capillary 94 70 - 99 mg/dL    Comment: Glucose reference range applies only to samples taken after fasting for at least 8 hours.   Comment 1 Notify RN    Comment 2 Document in Chart   Glucose, capillary     Status: Abnormal   Collection Time: 04/26/24 11:18 AM  Result Value Ref Range   Glucose-Capillary 170 (H) 70 - 99 mg/dL    Comment: Glucose reference range applies only to samples taken after fasting for at least 8 hours.  Glucose, capillary     Status: Abnormal   Collection Time: 04/26/24  5:06 PM  Result  Value Ref Range   Glucose-Capillary 199 (H) 70 - 99 mg/dL    Comment: Glucose reference range applies only to samples taken after fasting for at least 8 hours.  Glucose, capillary     Status: Abnormal   Collection Time: 04/26/24  9:05 PM  Result Value Ref Range   Glucose-Capillary 324 (H) 70 - 99 mg/dL    Comment: Glucose reference range applies only to samples taken after fasting for at least 8 hours.   Comment 1 Notify RN    Comment 2 Document in Chart   Glucose, capillary     Status: Abnormal   Collection Time: 04/27/24  2:06 AM  Result Value Ref Range   Glucose-Capillary 250 (H) 70 - 99 mg/dL    Comment: Glucose reference range applies only to samples taken after fasting for at least 8 hours.  CBC     Status: Abnormal   Collection Time: 04/27/24  2:32 AM  Result Value Ref Range   WBC 4.5 4.0 - 10.5 K/uL   RBC 3.26 (L) 3.87 - 5.11 MIL/uL   Hemoglobin 10.0 (L) 12.0 - 15.0 g/dL   HCT 68.9 (L) 63.9 - 53.9 %   MCV 95.1 80.0 - 100.0 fL   MCH 30.7 26.0 - 34.0 pg   MCHC 32.3 30.0 - 36.0 g/dL   RDW 83.6 (H) 88.4 - 84.4 %   Platelets 176 150 - 400 K/uL   nRBC 0.0 0.0 - 0.2 %    Comment: Performed at [facility], [address] [place], [place] [address]  Magnesium      Status: None   Collection Time: 04/27/24  2:32 AM  Result Value Ref Range   Magnesium  2.3 1.7 - 2.4 mg/dL    Comment: Performed at [facility], [address], [place], [place] [address]  Basic metabolic panel with GFR     Status: Abnormal   Collection Time: 04/27/24  2:32 AM  Result Value Ref Range   Sodium 133 (L) 135 - 145 mmol/L   Potassium 4.8 3.5 - 5.1 mmol/L   Chloride 97 (L) 98 - 111 mmol/L   CO2 31 22 - 32 mmol/L   Glucose, Bld 242 (H) 70 - 99 mg/dL    Comment: Glucose reference range applies only to samples taken after fasting for at least 8 hours.   BUN 25 (H) 6 - 20 mg/dL   Creatinine, Ser 4.04 (H) 0.44 - 1.00 mg/dL   Calcium  8.5 (L) 8.9 - 10.3 mg/dL   GFR, Estimated 8 (L) >60 mL/min     Comment: (NOTE) Calculated using the CKD-EPI Creatinine Equation (2021)    Anion gap 5 5 - 15    Comment: Performed at [facility], [address] [place], [place] [address]  Phosphorus     Status: None  Collection Time: 04/27/24  2:32 AM  Result Value Ref Range   Phosphorus 4.5 2.5 - 4.6 mg/dL    Comment: Performed at [facility], [address], [place], [place] [address]  Glucose, capillary     Status: Abnormal   Collection Time: 04/27/24  6:36 AM  Result Value Ref Range   Glucose-Capillary 168 (H) 70 - 99 mg/dL    Comment: Glucose reference range applies only to samples taken after fasting for at least 8 hours.   Comment 1 Notify RN    Comment 2 Document in Chart   Glucose, capillary     Status: Abnormal   Collection Time: 04/27/24 11:19 AM  Result Value Ref Range   Glucose-Capillary 239 (H) 70 - 99 mg/dL    Comment: Glucose reference range applies only to samples taken after fasting for at least 8 hours.   Comment 1 Notify RN    No results found.    Blood pressure 126/64, pulse 78, temperature 98.2 F (36.8 C), resp. rate 11, weight 44.3 kg, SpO2 100%.  Medical Problem List and Plan: 1. Functional deficits secondary to ***  -patient may *** shower  -ELOS/Goals: *** 2.  Antithrombotics: -DVT/anticoagulation:  Pharmaceutical: Heparin   -antiplatelet therapy: ASA/Ticagrelor  3. H/o Sacroiliitis/Pain Management: Tylenol  prn mild pain. --hydrocodone  5 mg (last filled 03/14/24 Dr. Rockey Pae ). . 4. Mood/Behavior/Sleep:  LCSW to follow evaluation and support  --Melatonin prn for insomina  -antipsychotic agents:  N/A   5. Neuropsych/cognition: This patient is capable of making decisions on her own behalf. 6. Skin/Wound Care: Routine pressure relief measures.  7. Fluids/Electrolytes/Nutrition:  Strict I/O. Daily weights. 1200 cc/FR, Add renal/CM restrictions 8. T2DM:  Hgb A1c-  8.5. Monitor BS ac/hs and use SSI for elevated BS.   --was on insulin   glargine 12 units w/ 5 units novolog  TID. Currently on 10 units insulin  glargine but BS ranging from 106-272.   --will continue to monitor BS for trend and titrate insulin  as needed.  9. ESRD: HD MWF at the end of the day to help with tolerance of therapy.   --Phoslo  tid ac for metabolic bone disease.  10. HTN: Monitor BP TID--on amlodipine , coreg , hydralazine  and imdur . 11. 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 .  12. Constipation: Has not had BM since admission. Was on miralax  PTA and Miralax  bid added today.   --sorbitol  45 cc today.  13. GERD: Took pepcid  daily--will resume.    ***  Sharlet GORMAN Schmitz, PA-C 04/27/2024     [1]  Allergies Allergen Reactions   Ibuprofen Swelling    Swelling to hands, feet and face    Influenza Vaccines Anaphylaxis and Swelling    fever   Pneumococcal Vaccine Anaphylaxis and Swelling    fever   Promethazine Other (See Comments)    hallucinations   Ambien  [Zolpidem ] Other (See Comments)    Unknown reaction   Codeine Itching   Cyclobenzaprine Other (See Comments)    hallucinations    Hydrocodone -Acetaminophen  Itching    Takes now and has not had many issues.   Tape Itching   Tramadol  Hcl Other (See Comments)    hallucinations   Wound Dressing Adhesive Itching    Use paper tape   Oxycodone  Nausea And Vomiting

## 2024-04-27 NOTE — Progress Notes (Signed)
 PROGRESS NOTE    Hannah [name]  FMW:[id number] DOB: 11/02/1965 DOA: 04/23/2024 PCP: Delayne Artist PARAS, MD    Brief Narrative: This 58 yrs old female with PMH significant for CAD status post PCI, Prior CVA, ESRD on hemodialysis, hypertension, hyperlipidemia, who presented to the ED due to visual field changes and aphasia.  Patient last known well was 04/22/2024. She had a prior stroke and has residual right sided hemiparesis.  Daughter has noticed left-sided facial droop and decreased strength on the left side so she called the EMS.  Patient was found to be hemodynamically stable in the ED, Labs creatinine 3.5, potassium 3.4, CT head no acute strokes,  MRI brain demonstrated acute infarct in the right dorsal midbrain.  Neurology was consulted and patient was admitted for stroke workup.  Assessment & Plan:   Principal Problem:   CVA (cerebral vascular accident) (HCC) Active Problems:   Acute hypoxic respiratory failure (HCC)   Acute CVA (cerebrovascular accident) (HCC)  Acute Right midbrain infarct: Patient presented with left-sided facial droop with decreased strength on the left side.   CT head no acute stroke noted MRI brain demonstrated acute infarct in the right dorsal midbrain. She is out of tPA window. Neurology consult appreciated.  Recommended complete stroke workup. Echo shows LVEF 65 to 70%, no RWMA. CTA Head /Neck  > 50% stenosis bilaterally LDL under 30 under goal, hemoglobin A1c 8.2.,  P2Y12 = 229 Recommend 30 days of aspirin  and Brilinta  and then aspirin  alone daily indefinitely. Continue Crestor  20 mg daily. PT and OT recommended CIR.  Hyperlipidemia: Continue rosuvastatin  20 mg daily .  LDL 30.  Hypertension: Gradually normalize BP in 2 to 3 days. Continue hydralazine ,  Imdur , and Coreg  and amlodipine .  CAD status post PCI: Continue brilanta, Asa,  Crestor  and carvedilol .  ESRD on hemodialysis: Nephrology consulted for continuation of hemodialysis. Continue  dialysis as per nephrology. Next Dialysis today.  Chorioretinitis Continue azithroprine.  Diabetes mellitus type 2 Uncontrolled, hemoglobin A1c 8.5. Continue Semglee  10 units daily Continue regular insulin  sliding scale.  DVT prophylaxis: Heparin  subcu Code Status: Full code Family Communication: Daughter at bedside. Disposition Plan:    Status is: Inpatient Remains inpatient appropriate because: Admitted for stroke workup.   Patient is awaiting CIR,  insurance approval pending  Consultants:  Neurology Nephrology.  Procedures:CT head, MRI   Antimicrobials:  Anti-infectives (From admission, onward)    None       Subjective: Patient was seen and examined at bedside. Overnight events noted. Patient is alert and oriented,  following commands.  Bleeding from AV fistula has stopped. She reports feeling tired , otherwise improving   Objective: Vitals:   04/27/24 1230 04/27/24 1300 04/27/24 1330 04/27/24 1400  BP: 107/62 (!) 142/67 128/62 120/60  Pulse: 84 86 86 85  Resp: 11 (!) 26 16 14   Temp:      TempSrc:      SpO2: 100% 100% 100% 100%  Weight:       No intake or output data in the 24 hours ending 04/27/24 1413  Filed Weights   04/24/24 0200 04/25/24 2315 04/26/24 0230  Weight: 46.3 kg 46.3 kg 44.3 kg    Examination:  General exam: Appears calm and comfortable, deconditioned, not in any acute distress. Respiratory system: CTA Bilaterally. Respiratory effort normal.  RR 13 Cardiovascular system: S1 & S2 heard, RRR. No JVD, murmurs, rubs, gallops or clicks.  Gastrointestinal system: Abdomen is non distended, soft and non tender. Normal bowel sounds heard. Central nervous system:  Alert and oriented x 3.  Right-sided weakness from prior stroke. Extremities: No edema, no cyanosis, no clubbing. Skin: No rashes, lesions or ulcers Psychiatry: Judgement and insight appear normal. Mood & affect appropriate.     Data Reviewed: I have personally reviewed following  labs and imaging studies  CBC: Recent Labs  Lab 04/23/24 1344 04/25/24 2349 04/27/24 0232  WBC 3.0* 4.0 4.5  NEUTROABS 1.6*  --   --   HGB 11.8* 10.3* 10.0*  HCT 37.8 32.3* 31.0*  MCV 96.7 96.7 95.1  PLT 163 173 176   Basic Metabolic Panel: Recent Labs  Lab 04/23/24 1344 04/25/24 2349 04/27/24 0232  NA 137 136 133*  K 3.4* 4.8 4.8  CL 97* 98 97*  CO2 29 29 31   GLUCOSE 184* 141* 242*  BUN 9 33* 25*  CREATININE 3.51* 6.93* 5.95*  CALCIUM  8.0* 8.6* 8.5*  MG  --   --  2.3  PHOS  --  5.7* 4.5   GFR: Estimated Creatinine Clearance: 7 mL/min (A) (by C-G formula based on SCr of 5.95 mg/dL (H)). Liver Function Tests: Recent Labs  Lab 04/23/24 1344 04/25/24 2349  AST 15  --   ALT 8  --   ALKPHOS 48  --   BILITOT 0.9  --   PROT 7.2  --   ALBUMIN 3.3* 3.0*   No results for input(s): LIPASE, AMYLASE in the last 168 hours. No results for input(s): AMMONIA in the last 168 hours. Coagulation Profile: Recent Labs  Lab 04/23/24 1344  INR 1.1   Cardiac Enzymes: No results for input(s): CKTOTAL, CKMB, CKMBINDEX, TROPONINI in the last 168 hours. BNP (last 3 results) No results for input(s): PROBNP in the last 8760 hours. HbA1C: No results for input(s): HGBA1C in the last 72 hours.  CBG: Recent Labs  Lab 04/26/24 1706 04/26/24 2105 04/27/24 0206 04/27/24 0636 04/27/24 1119  GLUCAP 199* 324* 250* 168* 239*   Lipid Profile: No results for input(s): CHOL, HDL, LDLCALC, TRIG, CHOLHDL, LDLDIRECT in the last 72 hours.  Thyroid  Function Tests: No results for input(s): TSH, T4TOTAL, FREET4, T3FREE, THYROIDAB in the last 72 hours. Anemia Panel: No results for input(s): VITAMINB12, FOLATE, FERRITIN, TIBC, IRON, RETICCTPCT in the last 72 hours. Sepsis Labs: No results for input(s): PROCALCITON, LATICACIDVEN in the last 168 hours.  No results found for this or any previous visit (from the past 240 hours).    Radiology Studies: No results found.  Scheduled Meds:  amLODipine   10 mg Oral Daily   aspirin  EC  81 mg Oral Daily   azaTHIOprine   50 mg Oral Daily   calcium  acetate  667 mg Oral TID WC   carvedilol   25 mg Oral BID WC   Chlorhexidine  Gluconate Cloth  6 each Topical Q0600   heparin  injection (subcutaneous)  5,000 Units Subcutaneous Q8H   hydrALAZINE   50 mg Oral TID   insulin  aspart  0-5 Units Subcutaneous QHS   insulin  aspart  0-9 Units Subcutaneous TID WC   insulin  glargine  10 Units Subcutaneous Daily   isosorbide  mononitrate  30 mg Oral QHS   rosuvastatin   20 mg Oral Daily   ticagrelor   90 mg Oral BID   Continuous Infusions:  anticoagulant sodium citrate       LOS: 4 days    Time spent: 35 Mins    Darcel Dawley, MD [facility]   If 7PM-7AM, please contact night-coverage

## 2024-04-27 NOTE — Plan of Care (Signed)
°  Problem: Ischemic Stroke/TIA Tissue Perfusion: Goal: Complications of ischemic stroke/TIA will be minimized Outcome: Progressing   Problem: Education: Goal: Knowledge of disease or condition will improve Outcome: Progressing   Problem: Elimination: Goal: Will not experience complications related to bowel motility Outcome: Progressing   Problem: Safety: Goal: Ability to remain free from injury will improve Outcome: Progressing   Problem: Skin Integrity: Goal: Risk for impaired skin integrity will decrease Outcome: Progressing   Problem: Activity: Goal: Risk for activity intolerance will decrease Outcome: Progressing   Problem: Clinical Measurements: Goal: Ability to maintain clinical measurements within normal limits will improve Outcome: Progressing

## 2024-04-27 NOTE — Progress Notes (Signed)
°   04/27/24 1551  Vitals  Temp 98 F (36.7 C)  Pulse Rate 84  Resp 15  SpO2 100 %  O2 Device Room Air  Oxygen Therapy  Patient Activity (if Appropriate) In bed  Pulse Oximetry Type Continuous  Oximetry Probe Site Changed No  Post Treatment  Dialyzer Clearance Lightly streaked  Liters Processed 73.5  Fluid Removed (mL) 2000 mL  Tolerated HD Treatment Yes  Post-Hemodialysis Comments Pt. tolerated tx well  AVG/AVF Arterial Site Held (minutes) 5 minutes  AVG/AVF Venous Site Held (minutes) 5 minutes

## 2024-04-27 NOTE — Progress Notes (Signed)
° °  Inpatient Rehabilitation Admissions Coordinator   [facility] medicare MD has requested peer to peer to be completed by 11 am Monday. I have arranged for a Rehab to complete this Monday am. Dr Leotis made aware by secure chat.  Heron Leavell, RN, MSN Rehab Admissions Coordinator [phone] 04/27/2024 4:33 PM

## 2024-04-27 NOTE — Progress Notes (Signed)
 [facility] Progress Note   Subjective:   Reports doing well, denies SOB, CP, dizziness, nausea. Worried about bleeding from AVF last HD, has not had any bleeding since then.   Objective Vitals:   04/26/24 1927 04/26/24 2304 04/27/24 0322 04/27/24 0726  BP: (!) 143/67 (!) 156/64 130/65 (!) 146/67  Pulse: 91 87 88 78  Resp: 18 18 18    Temp: 98.3 F (36.8 C) (!) 97.3 F (36.3 C) 97.7 F (36.5 C) 98.2 F (36.8 C)  TempSrc: Oral Oral Oral Oral  SpO2: 99% 100% 100% 99%  Weight:       Physical Exam General: alert female in NAD Heart: RRR, no murmur Lungs: CTA bilaterally, respirations unlabored Abdomen: Soft, non-distended Extremities: no edema b/l lower extremities Dialysis Access:  RUE AVG + t/b, no bleeding.   Additional Objective Labs: Basic Metabolic Panel: Recent Labs  Lab 04/23/24 1344 04/25/24 2349 04/27/24 0232  NA 137 136 133*  K 3.4* 4.8 4.8  CL 97* 98 97*  CO2 29 29 31   GLUCOSE 184* 141* 242*  BUN 9 33* 25*  CREATININE 3.51* 6.93* 5.95*  CALCIUM  8.0* 8.6* 8.5*  PHOS  --  5.7* 4.5   Liver Function Tests: Recent Labs  Lab 04/23/24 1344 04/25/24 2349  AST 15  --   ALT 8  --   ALKPHOS 48  --   BILITOT 0.9  --   PROT 7.2  --   ALBUMIN 3.3* 3.0*   No results for input(s): LIPASE, AMYLASE in the last 168 hours. CBC: Recent Labs  Lab 04/23/24 1344 04/25/24 2349 04/27/24 0232  WBC 3.0* 4.0 4.5  NEUTROABS 1.6*  --   --   HGB 11.8* 10.3* 10.0*  HCT 37.8 32.3* 31.0*  MCV 96.7 96.7 95.1  PLT 163 173 176   Blood Culture No results found for: SDES, SPECREQUEST, CULT, REPTSTATUS  Cardiac Enzymes: No results for input(s): CKTOTAL, CKMB, CKMBINDEX, TROPONINI in the last 168 hours. CBG: Recent Labs  Lab 04/26/24 1118 04/26/24 1706 04/26/24 2105 04/27/24 0206 04/27/24 0636  GLUCAP 170* 199* 324* 250* 168*   Iron Studies: No results for input(s): IRON, TIBC, TRANSFERRIN, FERRITIN in the last 72  hours. @lablastinr3 @ Studies/Results: No results found. Medications:  anticoagulant sodium citrate      amLODipine   10 mg Oral Daily   aspirin  EC  81 mg Oral Daily   azaTHIOprine   50 mg Oral Daily   calcium  acetate  667 mg Oral TID WC   carvedilol   25 mg Oral BID WC   Chlorhexidine  Gluconate Cloth  6 each Topical Q0600   heparin  injection (subcutaneous)  5,000 Units Subcutaneous Q8H   hydrALAZINE   50 mg Oral TID   insulin  aspart  0-9 Units Subcutaneous TID WC   insulin  glargine  10 Units Subcutaneous Daily   isosorbide  mononitrate  30 mg Oral QHS   rosuvastatin   20 mg Oral Daily   ticagrelor   90 mg Oral BID    Dialysis Orders: AF MWF 3:30 BFR 350 2K/2Ca AVG 16g No heparin   -No ESA  -Hectorol  1 mcg q HD  Assessment/Plan: Acute CVA. H/o prior strokes. 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 Management per neuro. Being evaluated for CIR.  ESRD.  HD MWF. Continue per schedule. HD today.  Prolonged bleeding from AVG yesterday. Getting sub Q heparin  here. Follow. May need vascular consult if reoccurs.  Hypertension. Home meds resumed. Remains hypertensive but improving.  Volume. Not grossly overloaded. UF as able  Anemia. Hgb  at goal, no ESA indicated at this time.  Metabolic bone disease: calcium  and phosphorus controlled. Continue Hectorol , Phoslo  binder  T2DM. Insulin  per primary   Lucie Collet, PA-C 04/27/2024, 8:20 AM  [facility] Pager: [phone]

## 2024-04-27 NOTE — Progress Notes (Signed)
 PT Cancellation Note  Patient Details Name: Hannah [name] MRN: [id number] DOB: July 22, 1965   Cancelled Treatment:    Reason Eval/Treat Not Completed: Patient at procedure or test/unavailable (Pt off floor for HD. Will follow-up for PT treatment as schedule permits.)  Randall SAUNDERS, PT, DPT Acute Rehabilitation Services Office: [phone] Secure Chat Preferred  [name] 04/27/2024, 12:07 PM

## 2024-04-27 NOTE — Plan of Care (Signed)
°  Problem: Education: Goal: Knowledge of disease or condition will improve Outcome: Progressing   Problem: Ischemic Stroke/TIA Tissue Perfusion: Goal: Complications of ischemic stroke/TIA will be minimized Outcome: Progressing   Problem: Nutrition: Goal: Risk of aspiration will decrease Outcome: Progressing   Problem: Coping: Goal: Ability to adjust to condition or change in health will improve Outcome: Progressing   Problem: Fluid Volume: Goal: Ability to maintain a balanced intake and output will improve Outcome: Progressing   Problem: Health Behavior/Discharge Planning: Goal: Ability to identify and utilize available resources and services will improve Outcome: Progressing   Problem: Metabolic: Goal: Ability to maintain appropriate glucose levels will improve Outcome: Progressing

## 2024-04-27 NOTE — Progress Notes (Signed)
 Pt. Awake and oriented with no complaints Consent signed and on file. Pt. Started with no complaints  UF removal:2054ml Tx duration: 3.5 hours  Access used:Right AVG Access issue: None  CBG at 79g/dl. Gave juice. Informed the nurse  Tx completed and tolerated Pressure dressing applied Endorsed to floor nurse Transported to room.  Viana Sleep Rubi Buell Parcel, RN [facility]

## 2024-04-28 DIAGNOSIS — I6302 Cerebral infarction due to thrombosis of basilar artery: Secondary | ICD-10-CM | POA: Diagnosis not present

## 2024-04-28 LAB — GLUCOSE, CAPILLARY
Glucose-Capillary: 205 mg/dL — ABNORMAL HIGH (ref 70–99)
Glucose-Capillary: 203 mg/dL — ABNORMAL HIGH (ref 70–99)
Glucose-Capillary: 272 mg/dL — ABNORMAL HIGH (ref 70–99)
Glucose-Capillary: 178 mg/dL — ABNORMAL HIGH (ref 70–99)

## 2024-04-28 MED ORDER — NALOXONE HCL 0.4 MG/ML IJ SOLN: 0.4000 mg | INTRAMUSCULAR | Status: DC | PRN

## 2024-04-28 MED ORDER — HYDROCODONE-ACETAMINOPHEN 5-325 MG PO TABS
1.0000 | ORAL_TABLET | Freq: Two times a day (BID) | ORAL | Status: DC | PRN
  Administered 2024-04-28 – 2024-04-29 (×3): 1 via ORAL
  Filled 2024-04-28 (×3): qty 1

## 2024-04-28 NOTE — Progress Notes (Signed)
 [facility] Progress Note   Subjective:   Completed HD yesterday with no reports of bleeding post dialysis. Having lower back pain, chronic and receives outpatient injections per daughter. Denies SOB, CP, dizziness.   Objective Vitals:   04/27/24 1551 04/27/24 1657 04/28/24 0000 04/28/24 0400  BP:  (!) 141/60 131/64 128/74  Pulse: 84 83 88 89  Resp: 15 16 (!) 21   Temp: 98 F (36.7 C) 98.2 F (36.8 C) 98 F (36.7 C) 98.6 F (37 C)  TempSrc:  Oral Oral Oral  SpO2: 100% 100% 100% 94%  Weight:       Physical Exam General: alert female in NAD Heart: RRR, no murmur Lungs: CTA bilaterally, respirations unlabored Abdomen: Soft, non-distended Extremities: no edema b/l lower extremities Dialysis Access:  RUE AVG + t/b, no bleeding.  Additional Objective Labs: Basic Metabolic Panel: Recent Labs  Lab 04/23/24 1344 04/25/24 2349 04/27/24 0232  NA 137 136 133*  K 3.4* 4.8 4.8  CL 97* 98 97*  CO2 29 29 31   GLUCOSE 184* 141* 242*  BUN 9 33* 25*  CREATININE 3.51* 6.93* 5.95*  CALCIUM  8.0* 8.6* 8.5*  PHOS  --  5.7* 4.5   Liver Function Tests: Recent Labs  Lab 04/23/24 1344 04/25/24 2349  AST 15  --   ALT 8  --   ALKPHOS 48  --   BILITOT 0.9  --   PROT 7.2  --   ALBUMIN 3.3* 3.0*   No results for input(s): LIPASE, AMYLASE in the last 168 hours. CBC: Recent Labs  Lab 04/23/24 1344 04/25/24 2349 04/27/24 0232  WBC 3.0* 4.0 4.5  NEUTROABS 1.6*  --   --   HGB 11.8* 10.3* 10.0*  HCT 37.8 32.3* 31.0*  MCV 96.7 96.7 95.1  PLT 163 173 176   Blood Culture No results found for: SDES, SPECREQUEST, CULT, REPTSTATUS  Cardiac Enzymes: No results for input(s): CKTOTAL, CKMB, CKMBINDEX, TROPONINI in the last 168 hours. CBG: Recent Labs  Lab 04/27/24 1119 04/27/24 1553 04/27/24 1715 04/27/24 2043 04/28/24 0623  GLUCAP 239* 79 131* 195* 205*   Iron Studies: No results for input(s): IRON, TIBC, TRANSFERRIN, FERRITIN in the  last 72 hours. @lablastinr3 @ Studies/Results: No results found. Medications:   amLODipine   10 mg Oral Daily   aspirin  EC  81 mg Oral Daily   azaTHIOprine   50 mg Oral Daily   calcium  acetate  667 mg Oral TID WC   carvedilol   25 mg Oral BID WC   Chlorhexidine  Gluconate Cloth  6 each Topical Q0600   heparin  injection (subcutaneous)  5,000 Units Subcutaneous Q8H   hydrALAZINE   50 mg Oral TID   insulin  aspart  0-5 Units Subcutaneous QHS   insulin  aspart  0-9 Units Subcutaneous TID WC   insulin  glargine  10 Units Subcutaneous Daily   isosorbide  mononitrate  30 mg Oral QHS   rosuvastatin   20 mg Oral Daily   ticagrelor   90 mg Oral BID    Dialysis Orders: AF MWF 3:30 BFR 350 2K/2Ca AVG 16g No heparin   -No ESA  -Hectorol  1 mcg q HD  Assessment/Plan: Acute CVA. H/o prior strokes. 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 Management per neuro. Being evaluated for CIR.  ESRD.  HD MWF. Continue per schedule.  Prolonged bleeding from AVG yesterday. Getting sub Q heparin  here. Did not have any prolonged bleeding on 04/27/24.  Hypertension. Home meds resumed. BP now at goal.  Volume. No evidence of volume overload on exam. Continue  UF with HD as able.  Anemia. Hgb at goal, no ESA indicated at this time.  Metabolic bone disease: calcium  and phosphorus controlled. Continue Hectorol , Phoslo  binder  T2DM. Insulin  per primary     Lucie Collet, PA-C 04/28/2024, 8:26 AM  [facility] Pager: [phone]

## 2024-04-28 NOTE — Plan of Care (Signed)
   Problem: Education: Goal: Knowledge of disease or condition will improve Outcome: Progressing Goal: Knowledge of secondary prevention will improve (MUST DOCUMENT ALL) Outcome: Progressing Goal: Knowledge of patient specific risk factors will improve (DELETE if not current risk factor) Outcome: Progressing

## 2024-04-28 NOTE — Progress Notes (Signed)
 Overnight cross coverage  Informed by RN that patient takes Norco at home PRN chronic back pain and is requesting for her home medication to be ordered.  Home medication resumed.

## 2024-04-28 NOTE — Progress Notes (Signed)
 PROGRESS NOTE    Hannah [name]  FMW:[id number] DOB: 03-Nov-1965 DOA: 04/23/2024 PCP: Delayne Artist PARAS, MD    Brief Narrative: This 58 yrs old female with PMH significant for CAD status post PCI, Prior CVA, ESRD on hemodialysis, hypertension, hyperlipidemia, who presented to the ED due to visual field changes and aphasia.  Patient last known well was 04/22/2024. She had a prior stroke and has residual right sided hemiparesis.  Daughter has noticed left-sided facial droop and decreased strength on the left side so she called the EMS.  Patient was found to be hemodynamically stable in the ED, Labs creatinine 3.5, potassium 3.4, CT head no acute strokes,  MRI brain demonstrated acute infarct in the right dorsal midbrain.  Neurology was consulted and patient was admitted for stroke workup.  Assessment & Plan:   Principal Problem:   CVA (cerebral vascular accident) (HCC) Active Problems:   Acute hypoxic respiratory failure (HCC)   Acute CVA (cerebrovascular accident) (HCC)  Acute Right midbrain infarct: Patient presented with left-sided facial droop with decreased strength on the left side.   CT head no acute stroke noted MRI brain demonstrated acute infarct in the right dorsal midbrain. She is out of tPA window. Neurology consult appreciated.  Recommended complete stroke workup. Echo shows LVEF 65 to 70%, no RWMA. CTA Head /Neck  > 50% stenosis bilaterally LDL under 30 under goal, hemoglobin A1c 8.2.,  P2Y12 = 229 Recommend 30 days of aspirin  and Brilinta  and then aspirin  alone daily indefinitely. Continue Crestor  20 mg daily. PT and OT recommended CIR. Neurology signed off.  Follow-up with Dr. Rosemarie @ [facility] in 4 weeks.  Hyperlipidemia: Continue rosuvastatin  20 mg daily .  LDL 30.  Hypertension: Gradually normalize BP in 2 to 3 days. Continue hydralazine ,  Imdur , and Coreg  and amlodipine .  CAD status post PCI: Continue brilanta, Asa, Crestor  and carvedilol .  ESRD on  hemodialysis: Nephrology consulted for continuation of hemodialysis. Continue dialysis as per nephrology. Next Dialysis Mon  Chorioretinitis Continue azithroprine.  Diabetes mellitus type 2 Uncontrolled, hemoglobin A1c 8.5. Continue Semglee  10 units daily Continue regular insulin  sliding scale.  DVT prophylaxis: Heparin  subcu Code Status: Full code Family Communication: Daughter at bedside. Disposition Plan:    Status is: Inpatient Remains inpatient appropriate because: Admitted for stroke workup.   Patient is awaiting CIR,  insurance approval pending  Consultants:  Neurology Nephrology.  Procedures:CT head, MRI   Antimicrobials:  Anti-infectives (From admission, onward)    None       Subjective: Patient was seen and examined at bedside. Overnight events noted. Patient is alert and oriented,  following commands.  Bleeding from AV fistula has stopped. She reports feeling much improved.  Objective: Vitals:   04/28/24 0000 04/28/24 0400 04/28/24 0836 04/28/24 1202  BP: 131/64 128/74 133/61 (!) 123/58  Pulse: 88 89 85 86  Resp: (!) 21  16 16   Temp: 98 F (36.7 C) 98.6 F (37 C) 98.2 F (36.8 C) (!) 97.5 F (36.4 C)  TempSrc: Oral Oral Oral Oral  SpO2: 100% 94% 100% 98%  Weight:        Intake/Output Summary (Last 24 hours) at 04/28/2024 1358 Last data filed at 04/27/2024 1551 Gross per 24 hour  Intake --  Output 2000 ml  Net -2000 ml    Filed Weights   04/24/24 0200 04/25/24 2315 04/26/24 0230  Weight: 46.3 kg 46.3 kg 44.3 kg    Examination:  General exam: Appears calm and comfortable, deconditioned, not in any acute distress.  Respiratory system: CTA Bilaterally. Respiratory effort normal.  RR 14 Cardiovascular system: S1 & S2 heard, RRR. No JVD, murmurs, rubs, gallops or clicks.  Gastrointestinal system: Abdomen is non distended, soft and non tender. Normal bowel sounds heard. Central nervous system: Alert and oriented x 3.  Right-sided weakness  from prior stroke. Extremities: No edema, no cyanosis, no clubbing. Skin: No rashes, lesions or ulcers Psychiatry: Judgement and insight appear normal. Mood & affect appropriate.     Data Reviewed: I have personally reviewed following labs and imaging studies  CBC: Recent Labs  Lab 04/23/24 1344 04/25/24 2349 04/27/24 0232  WBC 3.0* 4.0 4.5  NEUTROABS 1.6*  --   --   HGB 11.8* 10.3* 10.0*  HCT 37.8 32.3* 31.0*  MCV 96.7 96.7 95.1  PLT 163 173 176   Basic Metabolic Panel: Recent Labs  Lab 04/23/24 1344 04/25/24 2349 04/27/24 0232  NA 137 136 133*  K 3.4* 4.8 4.8  CL 97* 98 97*  CO2 29 29 31   GLUCOSE 184* 141* 242*  BUN 9 33* 25*  CREATININE 3.51* 6.93* 5.95*  CALCIUM  8.0* 8.6* 8.5*  MG  --   --  2.3  PHOS  --  5.7* 4.5   GFR: Estimated Creatinine Clearance: 7 mL/min (A) (by C-G formula based on SCr of 5.95 mg/dL (H)). Liver Function Tests: Recent Labs  Lab 04/23/24 1344 04/25/24 2349  AST 15  --   ALT 8  --   ALKPHOS 48  --   BILITOT 0.9  --   PROT 7.2  --   ALBUMIN 3.3* 3.0*   No results for input(s): LIPASE, AMYLASE in the last 168 hours. No results for input(s): AMMONIA in the last 168 hours. Coagulation Profile: Recent Labs  Lab 04/23/24 1344  INR 1.1   Cardiac Enzymes: No results for input(s): CKTOTAL, CKMB, CKMBINDEX, TROPONINI in the last 168 hours. BNP (last 3 results) No results for input(s): PROBNP in the last 8760 hours. HbA1C: No results for input(s): HGBA1C in the last 72 hours.  CBG: Recent Labs  Lab 04/27/24 1553 04/27/24 1715 04/27/24 2043 04/28/24 0623 04/28/24 1211  GLUCAP 79 131* 195* 205* 203*   Lipid Profile: No results for input(s): CHOL, HDL, LDLCALC, TRIG, CHOLHDL, LDLDIRECT in the last 72 hours.  Thyroid  Function Tests: No results for input(s): TSH, T4TOTAL, FREET4, T3FREE, THYROIDAB in the last 72 hours. Anemia Panel: No results for input(s): VITAMINB12, FOLATE,  FERRITIN, TIBC, IRON, RETICCTPCT in the last 72 hours. Sepsis Labs: No results for input(s): PROCALCITON, LATICACIDVEN in the last 168 hours.  No results found for this or any previous visit (from the past 240 hours).   Radiology Studies: No results found.  Scheduled Meds:  amLODipine   10 mg Oral Daily   aspirin  EC  81 mg Oral Daily   azaTHIOprine   50 mg Oral Daily   calcium  acetate  667 mg Oral TID WC   carvedilol   25 mg Oral BID WC   Chlorhexidine  Gluconate Cloth  6 each Topical Q0600   heparin  injection (subcutaneous)  5,000 Units Subcutaneous Q8H   hydrALAZINE   50 mg Oral TID   insulin  aspart  0-5 Units Subcutaneous QHS   insulin  aspart  0-9 Units Subcutaneous TID WC   insulin  glargine  10 Units Subcutaneous Daily   isosorbide  mononitrate  30 mg Oral QHS   rosuvastatin   20 mg Oral Daily   ticagrelor   90 mg Oral BID   Continuous Infusions:     LOS: 5 days  Time spent: 35 Mins    Darcel Dawley, MD [facility]   If 7PM-7AM, please contact night-coverage

## 2024-04-28 NOTE — Progress Notes (Signed)
 Occupational Therapy Treatment Patient Details Name: Hannah [name] MRN: [id number] DOB: 1965/10/19 Today's Date: 04/28/2024   History of present illness 58 y.o. female presents to [facility] hospital on 04/23/2024 with visual changes and aphasia. MRI demonstrates R dorsal midbrain CVA. PMH includes CAD, CVA with residual R weakness, ESRD, HTN, HLD., MI, MS.   OT comments  Pt. Seen for skilled OT treatment session with dtr present for duration of session.  Pt. Motivated and agreeable to participation.  Bed mobility with MOD A in/out.  Initial cues for L lean but pt.s sitting balance improved as session progressed and was able to sit in un supported sitting.  Mod a for seated LB dressing of socks.  Pt. Initiating use of B hands while donning socks without cues.  Sit/stand x1 MOD A with max cues for upright posture.  Attempted side step with MAX A but able to move each leg x1 step towards HOB to the L.  Encouraged cont. Integration of B hands during ADLs to promote functional use.  Pt. Making great gains with acute OT goals.  Remains excellent candidate for cont. Therapies >3hrs/day.        If plan is discharge home, recommend the following:  Two people to help with walking and/or transfers;A lot of help with bathing/dressing/bathroom;Assistance with cooking/housework;Direct supervision/assist for medications management;Direct supervision/assist for financial management;Assist for transportation;Help with stairs or ramp for entrance;Supervision due to cognitive status   Equipment Recommendations       Recommendations for Other Services Rehab consult    Precautions / Restrictions Precautions Precautions: Fall Precaution/Restrictions Comments: residual R hemiparesis       Mobility Bed Mobility Overal bed mobility: Needs Assistance Bed Mobility: Rolling, Sidelying to Sit, Sit to Sidelying Rolling: Min assist, Used rails Sidelying to sit: Mod assist, HOB elevated, Used rails     Sit to sidelying:  Mod assist, Used rails, HOB elevated General bed mobility comments: rolled to L in sidelying with use of BUES to reach for rail. mod a to guide trunk upright for sitting eob. back to bed able to transition into left sidelying with use of bed rail, assistance to guide BLEs into bed    Transfers  Sit/stand x1 mod a max cues for posture with heavy reliance on BUES on RW.   Side step to the L with max a for initiating movement of each LE MAX A.   Stand to sit with min a and cues to reach back for bed prior to sitting Scoot towards hob max a seated scoots x2 in prep for back to bed                        Balance Overall balance assessment: Needs assistance Sitting-balance support: Single extremity supported, Feet unsupported Sitting balance-Leahy Scale: Poor Sitting balance - Comments: intial L lean able to correct with verbal cues.  initiallly requiring ue support on bed rail, after LB dressing tasks noted balance improvment and pt. able to sit in un supported sitting w/o lean   Standing balance support: Bilateral upper extremity supported, Reliant on assistive device for balance Standing balance-Leahy Scale: Poor Standing balance comment: heavily reliant on UE and external support. Posterior lean throughout with minimal correction with cueing                           ADL either performed or assessed with clinical judgement   ADL Overall ADL's : Needs assistance/impaired  Lower Body Dressing: Moderate assistance;Sitting/lateral leans;Cueing for compensatory techniques;Cueing for sequencing Lower Body Dressing Details (indicate cue type and reason): seated eob with trunk support from therapist asst. pt. able to position herself in figure four with each LE,  initial portion of sock donned and pt.able to reach and finish pulling sock up with B hands.               General ADL Comments: able to don socks with assistance, educatoin and review  of rec. for use of B hands during adls to increase/maintain functional use of BUEs.    Extremity/Trunk Assessment              Occupational Psychologist Communication: Impaired Factors Affecting Communication: Reduced clarity of speech   Cognition Arousal: Alert Behavior During Therapy: WFL for tasks assessed/performed Cognition: Cognition impaired             OT - Cognition Comments: needs cues to veralize insight into deficits, then able to recognize deficits when prompted; delayed processing                 Following commands: Intact        Cueing   Cueing Techniques: Verbal cues, Gestural cues, Tactile cues  Exercises      Shoulder Instructions       General Comments  Likes gospel music    Pertinent Vitals/ Pain       Pain Assessment Pain Assessment: Faces Faces Pain Scale: Hurts even more Pain Location: buttocks/lower back area Pain Descriptors / Indicators: Aching, Discomfort Pain Intervention(s): Limited activity within patient's tolerance, Monitored during session, Repositioned  Home Living                                          Prior Functioning/Environment              Frequency  Min 2X/week        Progress Toward Goals  OT Goals(current goals can now be found in the care plan section)  Progress towards OT goals: Progressing toward goals     Plan      Co-evaluation                 AM-PAC OT 6 Clicks Daily Activity     Outcome Measure   Help from another person eating meals?: A Little Help from another person taking care of personal grooming?: A Little Help from another person toileting, which includes using toliet, bedpan, or urinal?: Total Help from another person bathing (including washing, rinsing, drying)?: A Lot Help from another person to put on and taking off regular upper body clothing?: A Lot Help from another person to put on and  taking off regular lower body clothing?: Total 6 Click Score: 12    End of Session Equipment Utilized During Treatment: Rolling walker (2 wheels);Gait belt  OT Visit Diagnosis: Unsteadiness on feet (R26.81);Muscle weakness (generalized) (M62.81);Cognitive communication deficit (R41.841);Hemiplegia and hemiparesis Symptoms and signs involving cognitive functions: Cerebral infarction Hemiplegia - Right/Left: Right Hemiplegia - dominant/non-dominant: Dominant Hemiplegia - caused by: Cerebral infarction   Activity Tolerance Patient tolerated treatment well   Patient Left in bed;with call bell/phone within reach;with bed alarm set;with SCD's reapplied;with family/visitor present   Nurse Communication Other (comment) (rn states ok to work with pt.  updated rn after session. pt. in L sidelying and will need to rotate R in 2 hrs for cont. pressure relief efforts)        Time: 8965-8898 OT Time Calculation (min): 27 min  Charges: OT General Charges $OT Visit: 1 Visit OT Treatments $Self Care/Home Management : 23-37 mins  Randall, COTA/L Acute Rehabilitation [phone]   CHRISTELLA Nest Lorraine-COTA/L  04/28/2024, 11:23 AM

## 2024-04-29 LAB — CBC
HCT: 30.6 % — ABNORMAL LOW (ref 36.0–46.0)
Hemoglobin: 9.9 g/dL — ABNORMAL LOW (ref 12.0–15.0)
MCH: 30.9 pg (ref 26.0–34.0)
MCHC: 32.4 g/dL (ref 30.0–36.0)
MCV: 95.6 fL (ref 80.0–100.0)
Platelets: 182 K/uL (ref 150–400)
RBC: 3.2 MIL/uL — ABNORMAL LOW (ref 3.87–5.11)
RDW: 15.9 % — ABNORMAL HIGH (ref 11.5–15.5)
WBC: 4.8 K/uL (ref 4.0–10.5)
nRBC: 0 % (ref 0.0–0.2)

## 2024-04-29 LAB — GLUCOSE, CAPILLARY
Glucose-Capillary: 176 mg/dL — ABNORMAL HIGH (ref 70–99)
Glucose-Capillary: 227 mg/dL — ABNORMAL HIGH (ref 70–99)
Glucose-Capillary: 106 mg/dL — ABNORMAL HIGH (ref 70–99)
Glucose-Capillary: 240 mg/dL — ABNORMAL HIGH (ref 70–99)

## 2024-04-29 LAB — BASIC METABOLIC PANEL WITH GFR
Anion gap: 9 (ref 5–15)
BUN: 30 mg/dL — ABNORMAL HIGH (ref 6–20)
CO2: 29 mmol/L (ref 22–32)
Calcium: 9.4 mg/dL (ref 8.9–10.3)
Chloride: 96 mmol/L — ABNORMAL LOW (ref 98–111)
Creatinine, Ser: 6.09 mg/dL — ABNORMAL HIGH (ref 0.44–1.00)
GFR, Estimated: 7 mL/min — ABNORMAL LOW (ref >60)
Glucose, Bld: 158 mg/dL — ABNORMAL HIGH (ref 70–99)
Potassium: 4.5 mmol/L (ref 3.5–5.1)
Sodium: 134 mmol/L — ABNORMAL LOW (ref 135–145)

## 2024-04-29 MED ORDER — CHLORHEXIDINE GLUCONATE CLOTH 2 % EX PADS
6.0000 | MEDICATED_PAD | Freq: Every day | CUTANEOUS | Status: DC
  Administered 2024-04-29 – 2024-04-30 (×2): 6 via TOPICAL

## 2024-04-29 MED ORDER — ONDANSETRON HCL 4 MG/2ML IJ SOLN
4.0000 mg | Freq: Four times a day (QID) | INTRAMUSCULAR | Status: DC | PRN
  Administered 2024-04-29: 4 mg via INTRAVENOUS
  Filled 2024-04-29: qty 2

## 2024-04-29 NOTE — Plan of Care (Signed)
 Problem: Education: Goal: Knowledge of disease or condition will improve 04/29/2024 1544 by Jenel Bobetta SAILOR, RN Outcome: Progressing 04/29/2024 0813 by Jenel Bobetta SAILOR, RN Outcome: Progressing Goal: Knowledge of secondary prevention will improve (MUST DOCUMENT ALL) 04/29/2024 1544 by Jenel Bobetta SAILOR, RN Outcome: Progressing 04/29/2024 0813 by Jenel Bobetta SAILOR, RN Outcome: Progressing Goal: Knowledge of patient specific risk factors will improve (DELETE if not current risk factor) 04/29/2024 1544 by Jenel Bobetta SAILOR, RN Outcome: Progressing 04/29/2024 0813 by Jenel Bobetta SAILOR, RN Outcome: Progressing   Problem: Ischemic Stroke/TIA Tissue Perfusion: Goal: Complications of ischemic stroke/TIA will be minimized 04/29/2024 1544 by Jenel Bobetta SAILOR, RN Outcome: Progressing 04/29/2024 0813 by Jenel Bobetta SAILOR, RN Outcome: Progressing   Problem: Coping: Goal: Will verbalize positive feelings about self 04/29/2024 1544 by Jenel Bobetta SAILOR, RN Outcome: Progressing 04/29/2024 0813 by Jenel Bobetta SAILOR, RN Outcome: Progressing Goal: Will identify appropriate support needs 04/29/2024 1544 by Jenel Bobetta SAILOR, RN Outcome: Progressing 04/29/2024 0813 by Jenel Bobetta SAILOR, RN Outcome: Progressing   Problem: Health Behavior/Discharge Planning: Goal: Ability to manage health-related needs will improve 04/29/2024 1544 by Jenel Bobetta SAILOR, RN Outcome: Progressing 04/29/2024 0813 by Jenel Bobetta SAILOR, RN Outcome: Progressing Goal: Goals will be collaboratively established with patient/family 04/29/2024 1544 by Jenel Bobetta SAILOR, RN Outcome: Progressing 04/29/2024 0813 by Jenel Bobetta SAILOR, RN Outcome: Progressing   Problem: Self-Care: Goal: Ability to participate in self-care as condition permits will improve 04/29/2024 1544 by Jenel Bobetta SAILOR, RN Outcome: Progressing 04/29/2024 0813 by Jenel Bobetta SAILOR, RN Outcome: Progressing Goal: Verbalization of feelings and concerns over difficulty with  self-care will improve 04/29/2024 1544 by Jenel Bobetta SAILOR, RN Outcome: Progressing 04/29/2024 0813 by Jenel Bobetta SAILOR, RN Outcome: Progressing Goal: Ability to communicate needs accurately will improve 04/29/2024 1544 by Jenel Bobetta SAILOR, RN Outcome: Progressing 04/29/2024 0813 by Jenel Bobetta SAILOR, RN Outcome: Progressing   Problem: Nutrition: Goal: Risk of aspiration will decrease 04/29/2024 1544 by Jenel Bobetta SAILOR, RN Outcome: Progressing 04/29/2024 0813 by Jenel Bobetta SAILOR, RN Outcome: Progressing Goal: Dietary intake will improve 04/29/2024 1544 by Jenel Bobetta SAILOR, RN Outcome: Progressing 04/29/2024 0813 by Jenel Bobetta SAILOR, RN Outcome: Progressing   Problem: Education: Goal: Ability to describe self-care measures that may prevent or decrease complications (Diabetes Survival Skills Education) will improve 04/29/2024 1544 by Jenel Bobetta SAILOR, RN Outcome: Progressing 04/29/2024 0813 by Jenel Bobetta SAILOR, RN Outcome: Progressing Goal: Individualized Educational Video(s) 04/29/2024 1544 by Jenel Bobetta SAILOR, RN Outcome: Progressing 04/29/2024 0813 by Jenel Bobetta SAILOR, RN Outcome: Progressing   Problem: Coping: Goal: Ability to adjust to condition or change in health will improve 04/29/2024 1544 by Jenel Bobetta SAILOR, RN Outcome: Progressing 04/29/2024 0813 by Jenel Bobetta SAILOR, RN Outcome: Progressing   Problem: Fluid Volume: Goal: Ability to maintain a balanced intake and output will improve 04/29/2024 1544 by Jenel Bobetta SAILOR, RN Outcome: Progressing 04/29/2024 0813 by Jenel Bobetta SAILOR, RN Outcome: Progressing   Problem: Health Behavior/Discharge Planning: Goal: Ability to identify and utilize available resources and services will improve 04/29/2024 1544 by Jenel Bobetta SAILOR, RN Outcome: Progressing 04/29/2024 0813 by Jenel Bobetta SAILOR, RN Outcome: Progressing Goal: Ability to manage health-related needs will improve 04/29/2024 1544 by Jenel Bobetta SAILOR, RN Outcome:  Progressing 04/29/2024 0813 by Jenel Bobetta SAILOR, RN Outcome: Progressing   Problem: Metabolic: Goal: Ability to maintain appropriate glucose levels will improve 04/29/2024 1544 by Jenel Bobetta SAILOR, RN Outcome: Progressing 04/29/2024 0813 by Jenel Bobetta SAILOR, RN Outcome: Progressing  Problem: Nutritional: Goal: Maintenance of adequate nutrition will improve 04/29/2024 1544 by Jenel Bobetta SAILOR, RN Outcome: Progressing 04/29/2024 0813 by Jenel Bobetta SAILOR, RN Outcome: Progressing Goal: Progress toward achieving an optimal weight will improve 04/29/2024 1544 by Jenel Bobetta SAILOR, RN Outcome: Progressing 04/29/2024 0813 by Jenel Bobetta SAILOR, RN Outcome: Progressing   Problem: Skin Integrity: Goal: Risk for impaired skin integrity will decrease 04/29/2024 1544 by Jenel Bobetta SAILOR, RN Outcome: Progressing 04/29/2024 0813 by Jenel Bobetta SAILOR, RN Outcome: Progressing   Problem: Tissue Perfusion: Goal: Adequacy of tissue perfusion will improve 04/29/2024 1544 by Jenel Bobetta SAILOR, RN Outcome: Progressing 04/29/2024 0813 by Jenel Bobetta SAILOR, RN Outcome: Progressing   Problem: Education: Goal: Knowledge of General Education information will improve Description: Including pain rating scale, medication(s)/side effects and non-pharmacologic comfort measures 04/29/2024 1544 by Jenel Bobetta SAILOR, RN Outcome: Progressing 04/29/2024 0813 by Jenel Bobetta SAILOR, RN Outcome: Progressing   Problem: Health Behavior/Discharge Planning: Goal: Ability to manage health-related needs will improve 04/29/2024 1544 by Jenel Bobetta SAILOR, RN Outcome: Progressing 04/29/2024 0813 by Jenel Bobetta SAILOR, RN Outcome: Progressing   Problem: Clinical Measurements: Goal: Ability to maintain clinical measurements within normal limits will improve 04/29/2024 1544 by Jenel Bobetta SAILOR, RN Outcome: Progressing 04/29/2024 0813 by Jenel Bobetta SAILOR, RN Outcome: Progressing Goal: Will remain free from infection 04/29/2024 1544 by  Jenel Bobetta SAILOR, RN Outcome: Progressing 04/29/2024 0813 by Jenel Bobetta SAILOR, RN Outcome: Progressing Goal: Diagnostic test results will improve 04/29/2024 1544 by Jenel Bobetta SAILOR, RN Outcome: Progressing 04/29/2024 0813 by Jenel Bobetta SAILOR, RN Outcome: Progressing Goal: Respiratory complications will improve 04/29/2024 1544 by Jenel Bobetta SAILOR, RN Outcome: Progressing 04/29/2024 0813 by Jenel Bobetta SAILOR, RN Outcome: Progressing Goal: Cardiovascular complication will be avoided 04/29/2024 1544 by Jenel Bobetta SAILOR, RN Outcome: Progressing 04/29/2024 0813 by Jenel Bobetta SAILOR, RN Outcome: Progressing   Problem: Activity: Goal: Risk for activity intolerance will decrease 04/29/2024 1544 by Jenel Bobetta SAILOR, RN Outcome: Progressing 04/29/2024 0813 by Jenel Bobetta SAILOR, RN Outcome: Progressing   Problem: Nutrition: Goal: Adequate nutrition will be maintained 04/29/2024 1544 by Jenel Bobetta SAILOR, RN Outcome: Progressing 04/29/2024 0813 by Jenel Bobetta SAILOR, RN Outcome: Progressing   Problem: Coping: Goal: Level of anxiety will decrease 04/29/2024 1544 by Jenel Bobetta SAILOR, RN Outcome: Progressing 04/29/2024 0813 by Jenel Bobetta SAILOR, RN Outcome: Progressing   Problem: Elimination: Goal: Will not experience complications related to bowel motility 04/29/2024 1544 by Jenel Bobetta SAILOR, RN Outcome: Progressing 04/29/2024 0813 by Jenel Bobetta SAILOR, RN Outcome: Progressing Goal: Will not experience complications related to urinary retention 04/29/2024 1544 by Jenel Bobetta SAILOR, RN Outcome: Progressing 04/29/2024 0813 by Jenel Bobetta SAILOR, RN Outcome: Progressing   Problem: Pain Managment: Goal: General experience of comfort will improve and/or be controlled 04/29/2024 1544 by Jenel Bobetta SAILOR, RN Outcome: Progressing 04/29/2024 0813 by Jenel Bobetta SAILOR, RN Outcome: Progressing   Problem: Safety: Goal: Ability to remain free from injury will improve 04/29/2024 1544 by Jenel Bobetta SAILOR,  RN Outcome: Progressing 04/29/2024 0813 by Jenel Bobetta SAILOR, RN Outcome: Progressing   Problem: Skin Integrity: Goal: Risk for impaired skin integrity will decrease 04/29/2024 1544 by Jenel Bobetta SAILOR, RN Outcome: Progressing 04/29/2024 0813 by Jenel Bobetta SAILOR, RN Outcome: Progressing

## 2024-04-29 NOTE — Plan of Care (Signed)

## 2024-04-29 NOTE — Progress Notes (Addendum)
 PROGRESS NOTE    Hannah [name]  FMW:[id number] DOB: Dec 30, 1965 DOA: 04/23/2024 PCP: Delayne Artist PARAS, MD  Chief Complaint  Patient presents with   Visual Field Change   Aphasia    Brief Narrative:    58 yrs old female with PMH significant for CAD status post PCI, Prior CVA, ESRD on hemodialysis, hypertension, hyperlipidemia, who presented to the ED due to visual field changes and aphasia.  Patient last known well was 04/22/2024. She had Kalyiah Saintil prior stroke and has residual right sided hemiparesis.  Daughter has noticed left-sided facial droop and decreased strength on the left side so she called the EMS.  Patient was found to be hemodynamically stable in the ED, Labs creatinine 3.5, potassium 3.4, CT head no acute strokes,  MRI brain demonstrated acute infarct in the right dorsal midbrain.  Neurology was consulted and patient was admitted for stroke workup.   Assessment & Plan:   Principal Problem:   CVA (cerebral vascular accident) (HCC) Active Problems:   Acute hypoxic respiratory failure (HCC)   Acute CVA (cerebrovascular accident) (HCC)  Acute Right midbrain infarct: Patient presented with left-sided facial droop with decreased strength on the left side.   CT head no acute stroke noted MRI brain demonstrated acute infarct in the right dorsal midbrain - scattered chronic microhemorrhages - moderate chronic microvascular ischemic changes. Neurology consult appreciated.  Recommended complete stroke workup. Echo shows LVEF 65 to 70%, no RWMA. CTA Head /Neck  > 50% stenosis bilaterally LDL under 30 under goal, hemoglobin A1c 8.5.,  P2Y12 = 229 PT and OT recommended CIR. Neurology signed off.  Recommending aspirin  and brilinta  x 30 days, then aspirin  indefinitely.  Crestor .  Follow-up with Dr. Rosemarie @ [facility] in 4 weeks.   Hyperlipidemia: Continue rosuvastatin  20 mg daily .  LDL 30.   Hypertension: Gradually normalize BP in 2 to 3 days. Continue hydralazine ,  Imdur , and Coreg  and  amlodipine .   CAD status post PCI: Continue brilanta, Asa, Crestor  and carvedilol .   ESRD on hemodialysis: Nephrology consulted for continuation of hemodialysis. Continue dialysis as per nephrology. Next Dialysis Mon   Chorioretinitis Continue azithroprine.   Diabetes mellitus type 2 Uncontrolled, hemoglobin A1c 8.5. Continue Semglee  10 units daily Continue regular insulin  sliding scale.    DVT prophylaxis: heparin  Code Status: full Family Communication: none Disposition:   Status is: Inpatient Remains inpatient appropriate because: need for continued inpatient care   Consultants:  neurology  Procedures:  Echo IMPRESSIONS     1. Left ventricular ejection fraction, by estimation, is 65 to 70%. The  left ventricle has normal function. The left ventricle has no regional  wall motion abnormalities. There is mild concentric left ventricular  hypertrophy. Left ventricular diastolic  parameters were normal.   2. Right ventricular systolic function is normal. The right ventricular  size is normal. There is normal pulmonary artery systolic pressure.   3. Taresa [place] small pericardial effusion is present. The pericardial effusion is  circumferential. There is no evidence of cardiac tamponade.   4. The mitral valve is normal in structure. No evidence of mitral valve  regurgitation. No evidence of mitral stenosis.   5. The aortic valve is normal in structure. Aortic valve regurgitation is  mild to moderate. No aortic stenosis is present.   6. Abdominal aorta is normal sized.   7. The inferior vena cava is normal in size with greater than 50%  respiratory variability, suggesting right atrial pressure of 3 mmHg.     Antimicrobials:  Anti-infectives (From  admission, onward)    None       Subjective: No new complaints  Objective: Vitals:   04/29/24 0525 04/29/24 0757 04/29/24 1151 04/29/24 1607  BP: 130/70 (!) 117/55 125/63 (!) 139/59  Pulse: 80 78 77 79  Resp: 18 18 16 18    Temp: 97.8 F (36.6 C) 98.8 F (37.1 C) 98.4 F (36.9 C) 98.7 F (37.1 C)  TempSrc: Oral Oral Oral Oral  SpO2: 100% 99% 99% 100%  Weight:       No intake or output data in the 24 hours ending 04/29/24 1804 Filed Weights   04/24/24 0200 04/25/24 2315 04/26/24 0230  Weight: 46.3 kg 46.3 kg 44.3 kg    Examination:  General exam: Appears calm and comfortable  Respiratory system: unlabored Cardiovascular system: RRR Gastrointestinal system: Abdomen is nondistended, soft and nontender Central nervous system: R facial droop, issues looking to R with EOM Extremities: no LEE    Data Reviewed: I have personally reviewed following labs and imaging studies  CBC: Recent Labs  Lab 04/23/24 1344 04/25/24 2349 04/27/24 0232 04/29/24 0634  WBC 3.0* 4.0 4.5 4.8  NEUTROABS 1.6*  --   --   --   HGB 11.8* 10.3* 10.0* 9.9*  HCT 37.8 32.3* 31.0* 30.6*  MCV 96.7 96.7 95.1 95.6  PLT 163 173 176 182    Basic Metabolic Panel: Recent Labs  Lab 04/23/24 1344 04/25/24 2349 04/27/24 0232 04/29/24 0634  NA 137 136 133* 134*  K 3.4* 4.8 4.8 4.5  CL 97* 98 97* 96*  CO2 29 29 31 29   GLUCOSE 184* 141* 242* 158*  BUN 9 33* 25* 30*  CREATININE 3.51* 6.93* 5.95* 6.09*  CALCIUM  8.0* 8.6* 8.5* 9.4  MG  --   --  2.3  --   PHOS  --  5.7* 4.5  --     GFR: Estimated Creatinine Clearance: 6.9 mL/min (Zaineb Nowaczyk) (by C-G formula based on SCr of 6.09 mg/dL (H)).  Liver Function Tests: Recent Labs  Lab 04/23/24 1344 04/25/24 2349  AST 15  --   ALT 8  --   ALKPHOS 48  --   BILITOT 0.9  --   PROT 7.2  --   ALBUMIN 3.3* 3.0*    CBG: Recent Labs  Lab 04/28/24 1618 04/28/24 2201 04/29/24 0623 04/29/24 1150 04/29/24 1605  GLUCAP 272* 178* 176* 227* 106*     No results found for this or any previous visit (from the past 240 hours).       Radiology Studies: No results found.      Scheduled Meds:  amLODipine   10 mg Oral Daily   aspirin  EC  81 mg Oral Daily   azaTHIOprine   50  mg Oral Daily   calcium  acetate  667 mg Oral TID WC   carvedilol   25 mg Oral BID WC   Chlorhexidine  Gluconate Cloth  6 each Topical Q0600   Chlorhexidine  Gluconate Cloth  6 each Topical Q0600   heparin  injection (subcutaneous)  5,000 Units Subcutaneous Q8H   hydrALAZINE   50 mg Oral TID   insulin  aspart  0-5 Units Subcutaneous QHS   insulin  aspart  0-9 Units Subcutaneous TID WC   insulin  glargine  10 Units Subcutaneous Daily   isosorbide  mononitrate  30 mg Oral QHS   rosuvastatin   20 mg Oral Daily   ticagrelor   90 mg Oral BID   Continuous Infusions:   LOS: 6 days    Time spent: over 30 min    Meliton  Perri, MD [facility]   To contact the attending provider between 7A-7P or the covering provider during after hours 7P-7A, please log into the web site [email or web address] and access using universal [place] password for that web site. If you do not have the password, please call the hospital operator.  04/29/2024, 6:04 PM

## 2024-04-29 NOTE — Progress Notes (Signed)
 [facility] Progress Note   Subjective:   Reports she has had some nausea and poor appetite today. Denies SOB, CP, dizziness.   Objective Vitals:   04/28/24 2022 04/29/24 0043 04/29/24 0525 04/29/24 0757  BP: 135/60 (!) 134/59 130/70 (!) 117/55  Pulse: 89 86 80 78  Resp: 18 18 18 18   Temp: 98 F (36.7 C) 98.3 F (36.8 C) 97.8 F (36.6 C) 98.8 F (37.1 C)  TempSrc: Oral Oral Oral Oral  SpO2: 100% 98% 100% 99%  Weight:       Physical Exam General: alert female in NAD Heart: RRR, no murmur Lungs: CTA bilaterally, respirations unlabored Abdomen: Soft, non-distended Extremities: no edema b/l lower extremities Dialysis Access:  RUE AVG + t/b, no bleeding.  Additional Objective Labs: Basic Metabolic Panel: Recent Labs  Lab 04/25/24 2349 04/27/24 0232 04/29/24 0634  NA 136 133* 134*  K 4.8 4.8 4.5  CL 98 97* 96*  CO2 29 31 29   GLUCOSE 141* 242* 158*  BUN 33* 25* 30*  CREATININE 6.93* 5.95* 6.09*  CALCIUM  8.6* 8.5* 9.4  PHOS 5.7* 4.5  --    Liver Function Tests: Recent Labs  Lab 04/23/24 1344 04/25/24 2349  AST 15  --   ALT 8  --   ALKPHOS 48  --   BILITOT 0.9  --   PROT 7.2  --   ALBUMIN 3.3* 3.0*   No results for input(s): LIPASE, AMYLASE in the last 168 hours. CBC: Recent Labs  Lab 04/23/24 1344 04/25/24 2349 04/27/24 0232 04/29/24 0634  WBC 3.0* 4.0 4.5 4.8  NEUTROABS 1.6*  --   --   --   HGB 11.8* 10.3* 10.0* 9.9*  HCT 37.8 32.3* 31.0* 30.6*  MCV 96.7 96.7 95.1 95.6  PLT 163 173 176 182   Blood Culture No results found for: SDES, SPECREQUEST, CULT, REPTSTATUS  Cardiac Enzymes: No results for input(s): CKTOTAL, CKMB, CKMBINDEX, TROPONINI in the last 168 hours. CBG: Recent Labs  Lab 04/28/24 0623 04/28/24 1211 04/28/24 1618 04/28/24 2201 04/29/24 0623  GLUCAP 205* 203* 272* 178* 176*   Iron Studies: No results for input(s): IRON, TIBC, TRANSFERRIN, FERRITIN in the last 72  hours. @lablastinr3 @ Studies/Results: No results found. Medications:   amLODipine   10 mg Oral Daily   aspirin  EC  81 mg Oral Daily   azaTHIOprine   50 mg Oral Daily   calcium  acetate  667 mg Oral TID WC   carvedilol   25 mg Oral BID WC   Chlorhexidine  Gluconate Cloth  6 each Topical Q0600   heparin  injection (subcutaneous)  5,000 Units Subcutaneous Q8H   hydrALAZINE   50 mg Oral TID   insulin  aspart  0-5 Units Subcutaneous QHS   insulin  aspart  0-9 Units Subcutaneous TID WC   insulin  glargine  10 Units Subcutaneous Daily   isosorbide  mononitrate  30 mg Oral QHS   rosuvastatin   20 mg Oral Daily   ticagrelor   90 mg Oral BID    Dialysis Orders: AF MWF 3:30 BFR 350 2K/2Ca AVG 16g No heparin   -No ESA  -Hectorol  1 mcg q HD  Assessment/Plan: Acute CVA. H/o prior strokes. 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 Management per neuro. Being evaluated for CIR.  ESRD.  HD MWF. Continue per schedule. HD Monday.  Prolonged bleeding from AVG yesterday. Getting sub Q heparin  here. Did not have any prolonged bleeding on 04/27/24.  Hypertension. Home meds resumed. BP now at goal.  Volume. No evidence of volume overload on exam.  Continue UF with HD as able.  Anemia. 9.9, follow and will start ESA if remains <10 Metabolic bone disease: calcium  and phosphorus controlled. Continue Hectorol , Phoslo  binder  T2DM. Insulin  per primary     Lucie Collet, PA-C 04/29/2024, 10:09 AM  [facility] Pager: [phone]

## 2024-04-30 ENCOUNTER — Inpatient Hospital Stay (HOSPITAL_COMMUNITY)
Admission: AD | Admit: 2024-04-30 | Discharge: 2024-05-13 | DRG: 056 | Disposition: A | Discharge status: Home / Self Care | Source: Intra-hospital | Attending: Physical Medicine and Rehabilitation | Admitting: Physical Medicine and Rehabilitation

## 2024-04-30 DIAGNOSIS — Z79899 Other long term (current) drug therapy: Secondary | ICD-10-CM

## 2024-04-30 DIAGNOSIS — I69322 Dysarthria following cerebral infarction: Secondary | ICD-10-CM | POA: Diagnosis not present

## 2024-04-30 DIAGNOSIS — M62561 Muscle wasting and atrophy, not elsewhere classified, right lower leg: Secondary | ICD-10-CM | POA: Diagnosis present

## 2024-04-30 DIAGNOSIS — Z7982 Long term (current) use of aspirin: Secondary | ICD-10-CM

## 2024-04-30 DIAGNOSIS — I69354 Hemiplegia and hemiparesis following cerebral infarction affecting left non-dominant side: Principal | ICD-10-CM

## 2024-04-30 DIAGNOSIS — Z955 Presence of coronary angioplasty implant and graft: Secondary | ICD-10-CM

## 2024-04-30 DIAGNOSIS — Z887 Allergy status to serum and vaccine status: Secondary | ICD-10-CM

## 2024-04-30 DIAGNOSIS — Z992 Dependence on renal dialysis: Secondary | ICD-10-CM | POA: Diagnosis not present

## 2024-04-30 DIAGNOSIS — E11649 Type 2 diabetes mellitus with hypoglycemia without coma: Secondary | ICD-10-CM | POA: Diagnosis not present

## 2024-04-30 DIAGNOSIS — E785 Hyperlipidemia, unspecified: Secondary | ICD-10-CM | POA: Diagnosis present

## 2024-04-30 DIAGNOSIS — Z885 Allergy status to narcotic agent status: Secondary | ICD-10-CM | POA: Diagnosis not present

## 2024-04-30 DIAGNOSIS — Z79624 Long term (current) use of inhibitors of nucleotide synthesis: Secondary | ICD-10-CM

## 2024-04-30 DIAGNOSIS — I69320 Aphasia following cerebral infarction: Secondary | ICD-10-CM

## 2024-04-30 DIAGNOSIS — F09 Unspecified mental disorder due to known physiological condition: Secondary | ICD-10-CM

## 2024-04-30 DIAGNOSIS — G35D Multiple sclerosis, unspecified: Secondary | ICD-10-CM | POA: Diagnosis present

## 2024-04-30 DIAGNOSIS — K5900 Constipation, unspecified: Secondary | ICD-10-CM | POA: Diagnosis present

## 2024-04-30 DIAGNOSIS — I639 Cerebral infarction, unspecified: Principal | ICD-10-CM | POA: Diagnosis present

## 2024-04-30 DIAGNOSIS — K219 Gastro-esophageal reflux disease without esophagitis: Secondary | ICD-10-CM | POA: Diagnosis present

## 2024-04-30 DIAGNOSIS — R011 Cardiac murmur, unspecified: Secondary | ICD-10-CM | POA: Diagnosis present

## 2024-04-30 DIAGNOSIS — G4700 Insomnia, unspecified: Secondary | ICD-10-CM | POA: Diagnosis present

## 2024-04-30 DIAGNOSIS — Z7902 Long term (current) use of antithrombotics/antiplatelets: Secondary | ICD-10-CM | POA: Diagnosis not present

## 2024-04-30 DIAGNOSIS — I252 Old myocardial infarction: Secondary | ICD-10-CM

## 2024-04-30 DIAGNOSIS — H5462 Unqualified visual loss, left eye, normal vision right eye: Secondary | ICD-10-CM | POA: Diagnosis present

## 2024-04-30 DIAGNOSIS — I959 Hypotension, unspecified: Secondary | ICD-10-CM | POA: Diagnosis not present

## 2024-04-30 DIAGNOSIS — R079 Chest pain, unspecified: Secondary | ICD-10-CM | POA: Diagnosis present

## 2024-04-30 DIAGNOSIS — M461 Sacroiliitis, not elsewhere classified: Secondary | ICD-10-CM | POA: Diagnosis present

## 2024-04-30 DIAGNOSIS — Z9109 Other allergy status, other than to drugs and biological substances: Secondary | ICD-10-CM

## 2024-04-30 DIAGNOSIS — I120 Hypertensive chronic kidney disease with stage 5 chronic kidney disease or end stage renal disease: Secondary | ICD-10-CM | POA: Diagnosis present

## 2024-04-30 DIAGNOSIS — E1122 Type 2 diabetes mellitus with diabetic chronic kidney disease: Secondary | ICD-10-CM | POA: Diagnosis present

## 2024-04-30 DIAGNOSIS — Z91048 Other nonmedicinal substance allergy status: Secondary | ICD-10-CM

## 2024-04-30 DIAGNOSIS — G8929 Other chronic pain: Secondary | ICD-10-CM | POA: Diagnosis present

## 2024-04-30 DIAGNOSIS — Z886 Allergy status to analgesic agent status: Secondary | ICD-10-CM | POA: Diagnosis not present

## 2024-04-30 DIAGNOSIS — I69392 Facial weakness following cerebral infarction: Secondary | ICD-10-CM

## 2024-04-30 DIAGNOSIS — N186 End stage renal disease: Secondary | ICD-10-CM | POA: Diagnosis present

## 2024-04-30 DIAGNOSIS — I69351 Hemiplegia and hemiparesis following cerebral infarction affecting right dominant side: Secondary | ICD-10-CM

## 2024-04-30 DIAGNOSIS — G8191 Hemiplegia, unspecified affecting right dominant side: Secondary | ICD-10-CM

## 2024-04-30 DIAGNOSIS — M62562 Muscle wasting and atrophy, not elsewhere classified, left lower leg: Secondary | ICD-10-CM | POA: Diagnosis present

## 2024-04-30 DIAGNOSIS — I3139 Other pericardial effusion (noninflammatory): Secondary | ICD-10-CM | POA: Diagnosis present

## 2024-04-30 DIAGNOSIS — I2510 Atherosclerotic heart disease of native coronary artery without angina pectoris: Secondary | ICD-10-CM | POA: Diagnosis present

## 2024-04-30 DIAGNOSIS — I10 Essential (primary) hypertension: Secondary | ICD-10-CM | POA: Diagnosis not present

## 2024-04-30 DIAGNOSIS — M545 Low back pain, unspecified: Secondary | ICD-10-CM | POA: Diagnosis present

## 2024-04-30 DIAGNOSIS — N2581 Secondary hyperparathyroidism of renal origin: Secondary | ICD-10-CM | POA: Diagnosis present

## 2024-04-30 DIAGNOSIS — I69393 Ataxia following cerebral infarction: Secondary | ICD-10-CM

## 2024-04-30 DIAGNOSIS — D631 Anemia in chronic kidney disease: Secondary | ICD-10-CM | POA: Diagnosis present

## 2024-04-30 DIAGNOSIS — M24521 Contracture, right elbow: Secondary | ICD-10-CM | POA: Diagnosis present

## 2024-04-30 DIAGNOSIS — Z794 Long term (current) use of insulin: Secondary | ICD-10-CM

## 2024-04-30 DIAGNOSIS — Z888 Allergy status to other drugs, medicaments and biological substances status: Secondary | ICD-10-CM

## 2024-04-30 DIAGNOSIS — M24511 Contracture, right shoulder: Secondary | ICD-10-CM | POA: Diagnosis present

## 2024-04-30 DIAGNOSIS — R739 Hyperglycemia, unspecified: Secondary | ICD-10-CM | POA: Diagnosis not present

## 2024-04-30 LAB — MAGNESIUM: Magnesium: 2.4 mg/dL (ref 1.7–2.4)

## 2024-04-30 LAB — CBC
HCT: 30.2 % — ABNORMAL LOW (ref 36.0–46.0)
Hemoglobin: 9.7 g/dL — ABNORMAL LOW (ref 12.0–15.0)
MCH: 30.9 pg (ref 26.0–34.0)
MCHC: 32.1 g/dL (ref 30.0–36.0)
MCV: 96.2 fL (ref 80.0–100.0)
Platelets: 191 K/uL (ref 150–400)
RBC: 3.14 MIL/uL — ABNORMAL LOW (ref 3.87–5.11)
RDW: 16 % — ABNORMAL HIGH (ref 11.5–15.5)
WBC: 5.4 K/uL (ref 4.0–10.5)
nRBC: 0 % (ref 0.0–0.2)

## 2024-04-30 LAB — COMPREHENSIVE METABOLIC PANEL WITH GFR
ALT: 8 U/L (ref 0–44)
AST: 13 U/L — ABNORMAL LOW (ref 15–41)
Albumin: 3 g/dL — ABNORMAL LOW (ref 3.5–5.0)
Alkaline Phosphatase: 38 U/L (ref 38–126)
Anion gap: 8 (ref 5–15)
BUN: 38 mg/dL — ABNORMAL HIGH (ref 6–20)
CO2: 30 mmol/L (ref 22–32)
Calcium: 9 mg/dL (ref 8.9–10.3)
Chloride: 95 mmol/L — ABNORMAL LOW (ref 98–111)
Creatinine, Ser: 7.91 mg/dL — ABNORMAL HIGH (ref 0.44–1.00)
GFR, Estimated: 5 mL/min — ABNORMAL LOW (ref >60)
Glucose, Bld: 174 mg/dL — ABNORMAL HIGH (ref 70–99)
Potassium: 5 mmol/L (ref 3.5–5.1)
Sodium: 133 mmol/L — ABNORMAL LOW (ref 135–145)
Total Bilirubin: 1 mg/dL (ref 0.0–1.2)
Total Protein: 6.4 g/dL — ABNORMAL LOW (ref 6.5–8.1)

## 2024-04-30 LAB — CBC WITH DIFFERENTIAL/PLATELET
Abs Immature Granulocytes: 0.02 K/uL (ref 0.00–0.07)
Basophils Absolute: 0.1 K/uL (ref 0.0–0.1)
Basophils Relative: 1 %
Eosinophils Absolute: 0.1 K/uL (ref 0.0–0.5)
Eosinophils Relative: 1 %
HCT: 31.1 % — ABNORMAL LOW (ref 36.0–46.0)
Hemoglobin: 9.8 g/dL — ABNORMAL LOW (ref 12.0–15.0)
Immature Granulocytes: 0 %
Lymphocytes Relative: 23 %
Lymphs Abs: 1.2 K/uL (ref 0.7–4.0)
MCH: 30.5 pg (ref 26.0–34.0)
MCHC: 31.5 g/dL (ref 30.0–36.0)
MCV: 96.9 fL (ref 80.0–100.0)
Monocytes Absolute: 0.3 K/uL (ref 0.1–1.0)
Monocytes Relative: 6 %
Neutro Abs: 3.6 K/uL (ref 1.7–7.7)
Neutrophils Relative %: 69 %
Platelets: 191 K/uL (ref 150–400)
RBC: 3.21 MIL/uL — ABNORMAL LOW (ref 3.87–5.11)
RDW: 16 % — ABNORMAL HIGH (ref 11.5–15.5)
WBC: 5.3 K/uL (ref 4.0–10.5)
nRBC: 0 % (ref 0.0–0.2)

## 2024-04-30 LAB — GLUCOSE, CAPILLARY
Glucose-Capillary: 159 mg/dL — ABNORMAL HIGH (ref 70–99)
Glucose-Capillary: 144 mg/dL — ABNORMAL HIGH (ref 70–99)
Glucose-Capillary: 218 mg/dL — ABNORMAL HIGH (ref 70–99)
Glucose-Capillary: 294 mg/dL — ABNORMAL HIGH (ref 70–99)

## 2024-04-30 LAB — PHOSPHORUS: Phosphorus: 4.7 mg/dL — ABNORMAL HIGH (ref 2.5–4.6)

## 2024-04-30 MED ORDER — HYDRALAZINE HCL 50 MG PO TABS: 50.0000 mg | ORAL_TABLET | Freq: Three times a day (TID) | ORAL | Status: DC

## 2024-04-30 MED ORDER — SORBITOL 70 % SOLN
60.0000 mL | Freq: Once | Status: AC
  Administered 2024-04-30: 18:00:00 60 mL via ORAL
  Filled 2024-04-30: qty 60

## 2024-04-30 MED ORDER — SORBITOL 70 % SOLN
60.0000 mL | Freq: Every day | Status: DC | PRN
  Filled 2024-04-30: qty 60

## 2024-04-30 MED ORDER — CHLORHEXIDINE GLUCONATE CLOTH 2 % EX PADS
6.0000 | MEDICATED_PAD | Freq: Every day | CUTANEOUS | Status: DC
  Administered 2024-05-01: 06:00:00 6 via TOPICAL

## 2024-04-30 MED ORDER — AMLODIPINE BESYLATE 10 MG PO TABS
10.0000 mg | ORAL_TABLET | Freq: Every day | ORAL | Status: DC
  Administered 2024-05-01: 08:00:00 10 mg via ORAL
  Filled 2024-04-30: qty 1

## 2024-04-30 MED ORDER — INSULIN GLARGINE 100 UNIT/ML ~~LOC~~ SOLN
10.0000 [IU] | Freq: Every day | SUBCUTANEOUS | Status: DC
  Administered 2024-04-30: 21:00:00 10 [IU] via SUBCUTANEOUS
  Filled 2024-04-30 (×3): qty 0.1

## 2024-04-30 MED ORDER — INSULIN ASPART 100 UNIT/ML IJ SOLN
0.0000 [IU] | Freq: Three times a day (TID) | INTRAMUSCULAR | Status: DC
  Administered 2024-04-30: 18:00:00 3 [IU] via SUBCUTANEOUS
  Administered 2024-05-01 – 2024-05-02 (×3): 2 [IU] via SUBCUTANEOUS
  Administered 2024-05-02: 12:00:00 3 [IU] via SUBCUTANEOUS
  Administered 2024-05-03: 13:00:00 2 [IU] via SUBCUTANEOUS
  Administered 2024-05-03: 17:00:00 3 [IU] via SUBCUTANEOUS
  Administered 2024-05-03: 06:00:00 2 [IU] via SUBCUTANEOUS
  Administered 2024-05-03: 17:00:00 3 [IU] via SUBCUTANEOUS
  Administered 2024-05-04: 1 [IU] via SUBCUTANEOUS
  Administered 2024-05-04 (×2): 2 [IU] via SUBCUTANEOUS
  Administered 2024-05-05: 7 [IU] via SUBCUTANEOUS
  Administered 2024-05-05: 2 [IU] via SUBCUTANEOUS
  Administered 2024-05-05: 3 [IU] via SUBCUTANEOUS
  Administered 2024-05-06 – 2024-05-07 (×2): 2 [IU] via SUBCUTANEOUS
  Administered 2024-05-07: 1 [IU] via SUBCUTANEOUS
  Administered 2024-05-08: 2 [IU] via SUBCUTANEOUS
  Administered 2024-05-08: 1 [IU] via SUBCUTANEOUS
  Filled 2024-04-30: qty 4
  Filled 2024-04-30: qty 1
  Filled 2024-04-30: qty 3
  Filled 2024-04-30: qty 2
  Filled 2024-04-30 (×2): qty 1
  Filled 2024-04-30: qty 3
  Filled 2024-04-30 (×3): qty 2
  Filled 2024-04-30: qty 3
  Filled 2024-04-30: qty 2
  Filled 2024-04-30: qty 1
  Filled 2024-04-30: qty 3
  Filled 2024-04-30 (×2): qty 2
  Filled 2024-04-30: qty 3
  Filled 2024-04-30 (×3): qty 2

## 2024-04-30 MED ORDER — INSULIN LISPRO (1 UNIT DIAL) 100 UNIT/ML (KWIKPEN): 0.0000 [IU] | PEN_INJECTOR | Freq: Three times a day (TID) | SUBCUTANEOUS | Status: DC

## 2024-04-30 MED ORDER — TICAGRELOR 90 MG PO TABS: 90.0000 mg | ORAL_TABLET | Freq: Two times a day (BID) | ORAL | Status: DC

## 2024-04-30 MED ORDER — INSULIN ASPART 100 UNIT/ML IJ SOLN
0.0000 [IU] | Freq: Every day | INTRAMUSCULAR | Status: DC
  Administered 2024-04-30: 21:00:00 3 [IU] via SUBCUTANEOUS
  Administered 2024-05-02: 22:00:00 4 [IU] via SUBCUTANEOUS
  Administered 2024-05-04 – 2024-05-07 (×3): 2 [IU] via SUBCUTANEOUS
  Filled 2024-04-30 (×3): qty 2
  Filled 2024-04-30: qty 3
  Filled 2024-04-30: qty 4

## 2024-04-30 MED ORDER — FAMOTIDINE 20 MG PO TABS
20.0000 mg | ORAL_TABLET | Freq: Every day | ORAL | Status: DC
  Administered 2024-04-30 – 2024-05-12 (×13): 20 mg via ORAL
  Filled 2024-04-30 (×13): qty 1

## 2024-04-30 MED ORDER — AZATHIOPRINE 50 MG PO TABS: 50.0000 mg | ORAL_TABLET | Freq: Every day | ORAL | Status: AC

## 2024-04-30 MED ORDER — MILK AND MOLASSES ENEMA
1.0000 | Freq: Every day | RECTAL | Status: DC | PRN
  Administered 2024-05-07: 150 mL via RECTAL
  Filled 2024-04-30 (×2): qty 150

## 2024-04-30 MED ORDER — ASPIRIN 81 MG PO TBEC
81.0000 mg | DELAYED_RELEASE_TABLET | Freq: Every day | ORAL | Status: DC
  Administered 2024-05-01 – 2024-05-13 (×12): 81 mg via ORAL
  Filled 2024-04-30 (×13): qty 1

## 2024-04-30 MED ORDER — ORAL CARE MOUTH RINSE: 15.0000 mL | OROMUCOSAL | Status: DC | PRN

## 2024-04-30 MED ORDER — SIMETHICONE 80 MG PO CHEW: 80.0000 mg | CHEWABLE_TABLET | Freq: Four times a day (QID) | ORAL | Status: DC | PRN

## 2024-04-30 MED ORDER — AZATHIOPRINE 50 MG PO TABS
50.0000 mg | ORAL_TABLET | Freq: Every day | ORAL | Status: DC
  Administered 2024-05-01 – 2024-05-13 (×12): 50 mg via ORAL
  Filled 2024-04-30 (×13): qty 1

## 2024-04-30 MED ORDER — ONDANSETRON HCL 4 MG/2ML IJ SOLN
4.0000 mg | Freq: Four times a day (QID) | INTRAMUSCULAR | Status: DC | PRN
  Administered 2024-05-01 (×2): 4 mg via INTRAVENOUS
  Filled 2024-04-30 (×3): qty 2

## 2024-04-30 MED ORDER — ISOSORBIDE MONONITRATE ER 30 MG PO TB24
30.0000 mg | ORAL_TABLET | Freq: Every day | ORAL | Status: DC
  Administered 2024-04-30 – 2024-05-12 (×13): 30 mg via ORAL
  Filled 2024-04-30 (×14): qty 1

## 2024-04-30 MED ORDER — TICAGRELOR 90 MG PO TABS
90.0000 mg | ORAL_TABLET | Freq: Two times a day (BID) | ORAL | Status: DC
  Administered 2024-04-30 – 2024-05-13 (×25): 90 mg via ORAL
  Filled 2024-04-30 (×26): qty 1

## 2024-04-30 MED ORDER — CALCIUM ACETATE (PHOS BINDER) 667 MG PO CAPS
667.0000 mg | ORAL_CAPSULE | Freq: Three times a day (TID) | ORAL | Status: DC
  Administered 2024-04-30 – 2024-05-13 (×36): 667 mg via ORAL
  Filled 2024-04-30 (×37): qty 1

## 2024-04-30 MED ORDER — DIPHENHYDRAMINE HCL 25 MG PO CAPS: 25.0000 mg | ORAL_CAPSULE | Freq: Four times a day (QID) | ORAL | Status: DC | PRN

## 2024-04-30 MED ORDER — HEPARIN SODIUM (PORCINE) 5000 UNIT/ML IJ SOLN
5000.0000 [IU] | Freq: Three times a day (TID) | INTRAMUSCULAR | Status: DC
  Administered 2024-04-30 – 2024-05-01 (×2): 5000 [IU] via SUBCUTANEOUS
  Filled 2024-04-30 (×2): qty 1

## 2024-04-30 MED ORDER — ASPIRIN 81 MG PO TBEC: 81.0000 mg | DELAYED_RELEASE_TABLET | Freq: Every day | ORAL | Status: AC

## 2024-04-30 MED ORDER — ACETAMINOPHEN 325 MG PO TABS
325.0000 mg | ORAL_TABLET | ORAL | Status: DC | PRN
  Administered 2024-05-09: 650 mg via ORAL
  Filled 2024-04-30 (×2): qty 2

## 2024-04-30 MED ORDER — GUAIFENESIN-DM 100-10 MG/5ML PO SYRP: 5.0000 mL | ORAL_SOLUTION | Freq: Four times a day (QID) | ORAL | Status: DC | PRN

## 2024-04-30 MED ORDER — NALOXONE HCL 0.4 MG/ML IJ SOLN: 0.4000 mg | INTRAMUSCULAR | Status: DC | PRN

## 2024-04-30 MED ORDER — CARVEDILOL 25 MG PO TABS
25.0000 mg | ORAL_TABLET | Freq: Two times a day (BID) | ORAL | Status: DC
  Administered 2024-04-30 – 2024-05-13 (×26): 25 mg via ORAL
  Filled 2024-04-30 (×26): qty 1

## 2024-04-30 MED ORDER — POLYETHYLENE GLYCOL 3350 17 G PO PACK
17.0000 g | PACK | Freq: Two times a day (BID) | ORAL | Status: DC
  Administered 2024-04-30 – 2024-05-13 (×20): 17 g via ORAL
  Filled 2024-04-30 (×26): qty 1

## 2024-04-30 MED ORDER — POLYETHYLENE GLYCOL 3350 17 G PO PACK
17.0000 g | PACK | Freq: Two times a day (BID) | ORAL | Status: DC
  Administered 2024-04-30: 14:00:00 17 g via ORAL
  Filled 2024-04-30 (×2): qty 1

## 2024-04-30 MED ORDER — FAMOTIDINE 20 MG PO TABS: 10.0000 mg | ORAL_TABLET | Freq: Two times a day (BID) | ORAL | Status: DC | PRN

## 2024-04-30 MED ORDER — ONDANSETRON HCL 4 MG PO TABS
4.0000 mg | ORAL_TABLET | Freq: Four times a day (QID) | ORAL | Status: DC | PRN
  Administered 2024-05-03 – 2024-05-11 (×3): 4 mg via ORAL
  Filled 2024-04-30 (×4): qty 1

## 2024-04-30 MED ORDER — ROSUVASTATIN CALCIUM 20 MG PO TABS
20.0000 mg | ORAL_TABLET | Freq: Every day | ORAL | Status: DC
  Administered 2024-05-01 – 2024-05-13 (×12): 20 mg via ORAL
  Filled 2024-04-30 (×13): qty 1

## 2024-04-30 MED ORDER — MELATONIN 3 MG PO TABS
3.0000 mg | ORAL_TABLET | Freq: Every evening | ORAL | Status: DC | PRN
  Administered 2024-04-30 – 2024-05-04 (×2): 3 mg via ORAL
  Filled 2024-04-30 (×2): qty 1

## 2024-04-30 MED ORDER — HYDRALAZINE HCL 50 MG PO TABS
50.0000 mg | ORAL_TABLET | Freq: Three times a day (TID) | ORAL | Status: DC
  Administered 2024-04-30 – 2024-05-13 (×31): 50 mg via ORAL
  Filled 2024-04-30 (×34): qty 1

## 2024-04-30 MED ORDER — HYDROCODONE-ACETAMINOPHEN 5-325 MG PO TABS
1.0000 | ORAL_TABLET | Freq: Two times a day (BID) | ORAL | Status: DC | PRN
  Administered 2024-04-30: 21:00:00 1 via ORAL
  Filled 2024-04-30: qty 1

## 2024-04-30 NOTE — Progress Notes (Signed)
 STAT EKG done for the patient, result uploaded to chart and result transmitted to patient chart

## 2024-04-30 NOTE — Progress Notes (Signed)
 PT Cancellation Note  Patient Details Name: Theona Muhs MRN: [id number] DOB: 1965-12-02   Cancelled Treatment:    Reason Eval/Treat Not Completed: Patient at procedure or test/unavailable  Patient in dialysis. Will attempt to see later in pm as medically appropriate and as schedule permits.    Macario RAMAN, PT Acute Rehabilitation Services  Office [phone]   Macario SHAUNNA Soja 04/30/2024, 9:22 AM

## 2024-04-30 NOTE — Progress Notes (Signed)
 Spoke to [place], NP with regard to patient bleeding and recommended the need for baseline blood clotting profile analysis but she does not feel the need for that as of now since the bleeding has stopped and patient just had her hemodialysis today. Will keep monitoring patient vitals and signs of bleeding.

## 2024-04-30 NOTE — Discharge Summary (Signed)
 Physician Discharge Summary  Hannah [name] FMW:[id number] DOB: 05-Jul-1965 DOA: 04/23/2024  PCP: Delayne Artist PARAS, MD  Admit date: 04/23/2024 Discharge date: 04/30/2024  Time spent: 40 minutes  Recommendations for Outpatient Follow-up:  Follow outpatient CBC/CMP  Follow with neurology outpatient    Discharge Diagnoses:  Principal Problem:   CVA (cerebral vascular accident) [facility]) Active Problems:   Acute hypoxic respiratory failure (HCC)   Acute CVA (cerebrovascular accident) [facility])   Discharge Condition: stable  Diet recommendation: renal  Filed Weights   04/25/24 2315 04/26/24 0230 04/30/24 0845  Weight: 46.3 kg 44.3 kg 46.7 kg    History of present illness:   58 yrs old female with PMH significant for CAD status post PCI, Prior CVA, ESRD on hemodialysis, hypertension, hyperlipidemia, who presented to the ED due to visual field changes and aphasia.  Patient last known well was 04/22/2024. She had Katalena Malveaux prior stroke and has residual right sided hemiparesis.  Daughter has noticed left-sided facial droop and decreased strength on the left side so she called the EMS.  Patient was found to be hemodynamically stable in the ED, Labs creatinine 3.5, potassium 3.4, CT head no acute strokes,  MRI brain demonstrated acute infarct in the right dorsal midbrain.  Neurology was consulted and patient was admitted for stroke workup.   Plan is for DAPT with aspirin /brilinta  x30 days, then aspirin  indefinitely.  Discharging to CIR 12/15.  Hospital Course:  Assessment and Plan:  Acute Right midbrain infarct: Patient presented with left-sided facial droop with decreased strength on the left side.   MRI brain demonstrated acute infarct in the right dorsal midbrain, partially involving the periaqueductal gray matter - scattered chronic microhemorrhages - moderate chronic microvascular ischemic changes. Neurology consult appreciated.  Recommended complete stroke workup. Echo shows LVEF 65 to 70%, no  RWMA. CTA Head /Neck  > 50% stenosis bilaterally LDL under 30 under goal, hemoglobin A1c 8.5.,  P2Y12 = 229 [name] and OT recommended CIR. Neurology signed off.  Recommending aspirin  and brilinta  x 30 days, then aspirin  indefinitely.  Crestor .  Follow-up with Dr. Rosemarie @ [facility] in 4 weeks.   Hyperlipidemia: Continue rosuvastatin  20 mg daily .  LDL 30.   Hypertension: Continue hydralazine ,  Imdur , and Coreg  and amlodipine .   CAD status post PCI (2009): Continue Asa, Crestor  and carvedilol .   ESRD on hemodialysis: Nephrology consulted for continuation of hemodialysis. Continue dialysis as per nephrology. Next Dialysis Mon   Chorioretinitis Continue azithroprine.   Diabetes mellitus type 2 Uncontrolled, hemoglobin A1c 8.5. Resume home basal + SSI     Procedures:  Echo IMPRESSIONS     1. Left ventricular ejection fraction, by estimation, is 65 to 70%. The  left ventricle has normal function. The left ventricle has no regional  wall motion abnormalities. There is mild concentric left ventricular  hypertrophy. Left ventricular diastolic  parameters were normal.   2. Right ventricular systolic function is normal. The right ventricular  size is normal. There is normal pulmonary artery systolic pressure.   3. Tahmid Stonehocker small pericardial effusion is present. The pericardial effusion is  circumferential. There is no evidence of cardiac tamponade.   4. The mitral valve is normal in structure. No evidence of mitral valve  regurgitation. No evidence of mitral stenosis.   5. The aortic valve is normal in structure. Aortic valve regurgitation is  mild to moderate. No aortic stenosis is present.   6. Abdominal aorta is normal sized.   7. The inferior vena cava is normal in size with  greater than 50%  respiratory variability, suggesting right atrial pressure of 3 mmHg.    Consultations: Neurology renal  Discharge Exam: Vitals:   04/30/24 1251 04/30/24 1331  BP: (!) 154/63 (!) 145/68   Pulse: 85 85  Resp: 11 14  Temp: 97.8 F (36.6 C) 98.5 F (36.9 C)  SpO2: 99% 99%   No complaints  General: No acute distress. Cardiovascular: RRR Lungs: unlabored Abdomen: Soft, nontender, nondistended Neurological: Alert. Moves all extremities 4.  Extremities: No clubbing or cyanosis. No edema.   Discharge Instructions   Discharge Instructions     Ambulatory referral to Neurology   Complete by: As directed    Follow up with Dr. Rosemarie at [facility] in 4-6 weeks. [name] is Hannah [name]. Thanks.   Call MD for:  difficulty breathing, headache or visual disturbances   Complete by: As directed    Call MD for:  extreme fatigue   Complete by: As directed    Call MD for:  hives   Complete by: As directed    Call MD for:  persistant dizziness or light-headedness   Complete by: As directed    Call MD for:  persistant nausea and vomiting   Complete by: As directed    Call MD for:  redness, tenderness, or signs of infection (pain, swelling, redness, odor or green/yellow discharge around incision site)   Complete by: As directed    Call MD for:  severe uncontrolled pain   Complete by: As directed    Call MD for:  temperature >100.4   Complete by: As directed    Discharge instructions   Complete by: As directed    Hannah [name] were seen for Hannah [name] stroke.    Hannah [name]'ve been started on aspirin  and brilinta  for 30 days, then Hannah [name]'ll be on aspirin  alone.  Continue crestor .  Hannah [name]'ll need to follow up with neurology outpatient in 4 weeks.  We'll send Hannah [name] to inpatient rehab.  Return for new, recurrent, or worsening symptoms.  Please ask your PCP to request records from this hospitalization so they know what was done and what the next steps will be.   Increase activity slowly   Complete by: As directed       Allergies as of 04/30/2024       Reactions   Ibuprofen Swelling   Swelling to hands, feet and face   Influenza Vaccines Anaphylaxis, Swelling   fever   Pneumococcal Vaccine Anaphylaxis, Swelling    fever   Promethazine Other (See Comments)   hallucinations   Ambien  [zolpidem ] Other (See Comments)   Unknown reaction   Codeine Itching   Cyclobenzaprine Other (See Comments)   hallucinations   Hydrocodone -acetaminophen  Itching   Takes now and has not had many issues.   Tape Itching   Tramadol  Hcl Other (See Comments)   hallucinations   Wound Dressing Adhesive Itching   Use paper tape   Oxycodone  Nausea And Vomiting        Medication List     STOP taking these medications    clopidogrel  75 MG tablet Commonly known as: PLAVIX        TAKE these medications    acetaminophen  500 MG tablet Commonly known as: TYLENOL  Take 500 mg by mouth every 6 (six) hours as needed for mild pain (pain score 1-3), moderate pain (pain score 4-6), fever or headache.   amLODipine  10 MG tablet Commonly known as: NORVASC  Take 1 tablet (10 mg total) by mouth daily. Except on dialysis days do not take Rechy Bost  dose.   aspirin  EC 81 MG tablet Take 1 tablet (81 mg total) by mouth daily. Swallow whole. Start taking on: May 01, 2024   azaTHIOprine  50 MG tablet Commonly known as: IMURAN  Take 1 tablet (50 mg total) by mouth daily.   calcium  acetate 667 MG capsule Commonly known as: PHOSLO  Take 667 mg by mouth in the morning, at noon, and at bedtime.   carvedilol  25 MG tablet Commonly known as: COREG  Take 1 tablet (25 mg total) by mouth 2 (two) times daily with Artavius Stearns meal. And as directed.   diclofenac  Sodium 1 % Gel Commonly known as: VOLTAREN  Apply 2 g topically 4 (four) times daily. To leg What changed:  when to take this reasons to take this   famotidine  20 MG tablet Commonly known as: PEPCID  Take 1 tablet (20 mg total) by mouth daily.   FreeStyle Libre 2 Sensor Misc USE AS DIRECTED CHANGE EVERY 14 DAYS   hydrALAZINE  50 MG tablet Commonly known as: APRESOLINE  Take 1 tablet (50 mg total) by mouth 3 (three) times daily. What changed:  medication strength how much to take when to  take this   HYDROcodone -acetaminophen  5-325 MG tablet Commonly known as: NORCO/VICODIN Take 1 tablet by mouth every 12 (twelve) hours as needed for moderate pain (pain score 4-6) or severe pain (pain score 7-10).   insulin  lispro 100 UNIT/ML KwikPen Commonly known as: HUMALOG  Inject 0-9 Units into the skin 3 (three) times daily. CBG < 70: treat low blood sugar  CBG 70 - 120: 0 units  CBG 121 - 150: 1 unit  CBG 151 - 200: 2 units  CBG 201 - 250: 3 units  CBG 251 - 300: 5 units  CBG 301 - 350: 7 units  CBG 351 - 400: 9 units  CBG > 400: call MD What changed:  how much to take additional instructions   isosorbide  mononitrate 30 MG 24 hr tablet Commonly known as: IMDUR  Take 1 tablet (30 mg total) by mouth at bedtime.   lidocaine -prilocaine  cream Commonly known as: EMLA  Apply 1 Application topically daily as needed (port access).   metoCLOPramide  5 MG tablet Commonly known as: REGLAN  Take 5 mg by mouth every 6 (six) hours as needed for nausea or vomiting.   naloxone  4 MG/0.1ML Liqd nasal spray kit Commonly known as: NARCAN  Place 0.4 mg into the nose as needed.   nitroGLYCERIN  0.4 MG SL tablet Commonly known as: NITROSTAT  Place 1 tablet (0.4 mg total) under the tongue every 5 (five) minutes as needed for chest pain.   ondansetron  4 MG tablet Commonly known as: ZOFRAN  Take 4 mg by mouth every 8 (eight) hours as needed for nausea or vomiting.   polyethylene glycol 17 g packet Commonly known as: MIRALAX  / GLYCOLAX  Take 17 g by mouth daily. What changed:  when to take this reasons to take this   rosuvastatin  20 MG tablet Commonly known as: CRESTOR  Take 20 mg by mouth daily.   senna-docusate 8.6-50 MG tablet Commonly known as: Senokot-S Take 1 tablet by mouth 2 (two) times daily as needed for moderate constipation. What changed: reasons to take this   ticagrelor  90 MG Tabs tablet Commonly known as: BRILINTA  Take 1 tablet (90 mg total) by mouth 2 (two) times daily  for 25 days.   Tresiba  FlexTouch 100 UNIT/ML FlexTouch Pen Generic drug: insulin  degludec Inject 12 Units into the skin at bedtime. What changed: how much to take       Allergies[1]  Follow-up  Information     Rosemarie Eather RAMAN, MD. Schedule an appointment as soon as possible for Patryce Depriest visit in 1 month(s).   Specialties: Neurology, Radiology Why: stroke clinic Contact information: [address] [place] [address] [phone]                  The results of significant diagnostics from this hospitalization (including imaging, microbiology, ancillary and laboratory) are listed below for reference.    Significant Diagnostic Studies: ECHOCARDIOGRAM COMPLETE Result Date: 04/24/2024    ECHOCARDIOGRAM REPORT   Patient Name:   AREONA HOMER Date of Exam: 04/24/2024 Medical Rec #:  [id number]    Height:       62.0 in Accession #:    [id number]   Weight:       102.1 lb Date of Birth:  01/13/1966   BSA:          1.436 m Patient Age:    58 years     BP:           190/74 mmHg Patient Gender: F            HR:           85 bpm. Exam Location:  Inpatient Procedure: 2D Echo (Both Spectral and Color Flow Doppler were utilized during            procedure). Indications:    Stroke  History:        Patient has prior history of Echocardiogram examinations.                 Stroke.  Sonographer:    Norleen Amour Referring Phys: [id number] ROBERT DORRELL IMPRESSIONS  1. Left ventricular ejection fraction, by estimation, is 65 to 70%. The left ventricle has normal function. The left ventricle has no regional wall motion abnormalities. There is mild concentric left ventricular hypertrophy. Left ventricular diastolic parameters were normal.  2. Right ventricular systolic function is normal. The right ventricular size is normal. There is normal pulmonary artery systolic pressure.  3. Johnta Couts small pericardial effusion is present. The pericardial effusion is circumferential. There is no evidence of cardiac  tamponade.  4. The mitral valve is normal in structure. No evidence of mitral valve regurgitation. No evidence of mitral stenosis.  5. The aortic valve is normal in structure. Aortic valve regurgitation is mild to moderate. No aortic stenosis is present.  6. Abdominal aorta is normal sized.  7. The inferior vena cava is normal in size with greater than 50% respiratory variability, suggesting right atrial pressure of 3 mmHg. FINDINGS  Left Ventricle: Left ventricular ejection fraction, by estimation, is 65 to 70%. The left ventricle has normal function. The left ventricle has no regional wall motion abnormalities. The left ventricular internal cavity size was normal in size. There is  mild concentric left ventricular hypertrophy. Left ventricular diastolic parameters were normal. Right Ventricle: The right ventricular size is normal. No increase in right ventricular wall thickness. Right ventricular systolic function is normal. There is normal pulmonary artery systolic pressure. The tricuspid regurgitant velocity is 2.27 m/s, and  with an assumed right atrial pressure of 3 mmHg, the estimated right ventricular systolic pressure is 23.6 mmHg. Left Atrium: Left atrial size was normal in size. Right Atrium: Right atrial size was normal in size. Pericardium: Zayven Powe small pericardial effusion is present. The pericardial effusion is circumferential. There is no evidence of cardiac tamponade. Mitral Valve: The mitral valve is normal in structure. No evidence of mitral valve  regurgitation. No evidence of mitral valve stenosis. MV peak gradient, 4.8 mmHg. The mean mitral valve gradient is 3.0 mmHg. Tricuspid Valve: The tricuspid valve is normal in structure. Tricuspid valve regurgitation is not demonstrated. No evidence of tricuspid stenosis. Aortic Valve: The aortic valve is normal in structure. Aortic valve regurgitation is mild to moderate. No aortic stenosis is present. Aortic valve mean gradient measures 3.0 mmHg. Aortic  valve peak gradient measures 5.3 mmHg. Aortic valve area, by VTI measures 3.72 cm. Pulmonic Valve: The pulmonic valve was normal in structure. Pulmonic valve regurgitation is not visualized. No evidence of pulmonic stenosis. Aorta: Abdominal aorta is normal sized. The aortic root is normal in size and structure. Venous: The inferior vena cava is normal in size with greater than 50% respiratory variability, suggesting right atrial pressure of 3 mmHg. IAS/Shunts: No atrial level shunt detected by color flow Doppler.  LEFT VENTRICLE PLAX 2D LVIDd:         3.70 cm     Diastology LVIDs:         1.90 cm     LV e' medial:    6.64 cm/s LV PW:         1.20 cm     LV E/e' medial:  13.4 LV IVS:        1.30 cm     LV e' lateral:   7.62 cm/s LVOT diam:     1.90 cm     LV E/e' lateral: 11.7 LV SV:         70 LV SV Index:   49 LVOT Area:     2.84 cm  LV Volumes (MOD) LV vol d, MOD A2C: 59.4 ml LV vol d, MOD A4C: 67.3 ml LV vol s, MOD A2C: 15.2 ml LV vol s, MOD A4C: 19.2 ml LV SV MOD A2C:     44.2 ml LV SV MOD A4C:     67.3 ml LV SV MOD BP:      47.7 ml RIGHT VENTRICLE             IVC RV Basal diam:  2.40 cm     IVC diam: 1.10 cm RV S prime:     15.70 cm/s TAPSE (M-mode): 2.2 cm      PULMONARY VEINS                             Diastolic Velocity: 51.40 cm/s                             S/D Velocity:       0.90                             Systolic Velocity:  48.20 cm/s LEFT ATRIUM             Index        RIGHT ATRIUM          Index LA diam:        3.40 cm 2.37 cm/m   RA Area:     6.82 cm LA Vol (A2C):   37.9 ml 26.39 ml/m  RA Volume:   10.50 ml 7.31 ml/m LA Vol (A4C):   43.1 ml 30.01 ml/m LA Biplane Vol: 44.2 ml 30.77 ml/m  AORTIC VALVE  PULMONIC VALVE AV Area (Vmax):    3.01 cm     PV Vmax:       1.07 m/s AV Area (Vmean):   2.61 cm     PV Peak grad:  4.6 mmHg AV Area (VTI):     3.72 cm AV Vmax:           115.00 cm/s AV Vmean:          85.000 cm/s AV VTI:            0.189 m AV Peak Grad:      5.3 mmHg AV  Mean Grad:      3.0 mmHg LVOT Vmax:         122.00 cm/s LVOT Vmean:        78.100 cm/s LVOT VTI:          0.248 m LVOT/AV VTI ratio: 1.31  AORTA Ao Root diam: 2.50 cm Ao Asc diam:  2.70 cm MITRAL VALVE                TRICUSPID VALVE MV Area (PHT): 5.58 cm     TR Peak grad:   20.6 mmHg MV Area VTI:   2.76 cm     TR Vmax:        227.00 cm/s MV Peak grad:  4.8 mmHg MV Mean grad:  3.0 mmHg     SHUNTS MV Vmax:       1.09 m/s     Systemic VTI:  0.25 m MV Vmean:      80.6 cm/s    Systemic Diam: 1.90 cm MV Decel Time: 136 msec MV E velocity: 89.10 cm/s MV Fatou Dunnigan velocity: 121.00 cm/s MV E/Pocahontas Cohenour ratio:  0.74 Kardie Tobb DO Electronically signed by Dub Huntsman DO Signature Date/Time: 04/24/2024/2:38:49 PM    Final    CT ANGIO HEAD NECK W WO CM Result Date: 04/24/2024 CLINICAL DATA:  Stroke/TIA EXAM: CT ANGIOGRAPHY HEAD AND NECK WITH AND WITHOUT CONTRAST TECHNIQUE: Multidetector CT imaging of the head and neck was performed using the standard protocol during bolus administration of intravenous contrast. Multiplanar CT image reconstructions and MIPs were obtained to evaluate the vascular anatomy. Carotid stenosis measurements (when applicable) are obtained utilizing NASCET criteria, using the distal internal carotid diameter as the denominator. RADIATION DOSE REDUCTION: This exam was performed according to the departmental dose-optimization program which includes automated exposure control, adjustment of the mA and/or kV according to patient size and/or use of iterative reconstruction technique. CONTRAST:  75mL OMNIPAQUE  IOHEXOL  350 MG/ML SOLN COMPARISON:  Brain MRI April 23, 2024 FINDINGS: CT HEAD: There is low-attenuation in the cerebral white matter There is no hemorrhage. No acute ischemic changes. No mass lesion. The ventricles are normal. Skull/sinuses/orbits: No significant abnormality. CTA NECK: CTA NECK Aortic arch: No proximal vessel stenosis. Right carotid: There is Alisan Dokes small amount of plaque with less than 50% stenosis  Left carotid: Small amount of plaque with less than 50% stenosis Right vertebral: Normal Left vertebral: Normal Soft tissues: No significant abnormality Other comments: None CTA HEAD: CTA HEAD Right anterior circulation: The internal carotid artery is patent without significant stenosis. The anterior and middle cerebral arteries are patent without significant stenosis or proximal branch occlusion. No aneurysm. Left anterior circulation: The internal carotid artery is patent without significant stenosis. The anterior and middle cerebral arteries are patent without significant stenosis or proximal branch occlusion. No aneurysm. Posterior circulation: Both vertebral arteries are patent intracranially. No basilar stenosis. There is mild stenosis of the  P1 segment of the right posterior cerebral artery. IMPRESSION: 1. Bilateral carotid plaque with less than 50% stenosis 2. No significant vertebrobasilar or intracranial disease 3. Chronic white matter abnormalities. No acute abnormality noted on the CT. Electronically Signed   By: Nancyann Burns M.D.   On: 04/24/2024 09:43   MR BRAIN WO CONTRAST Result Date: 04/23/2024 EXAM: MRI BRAIN WITHOUT CONTRAST 04/23/2024 05:42:47 PM TECHNIQUE: Multiplanar multisequence MRI of the head/brain was performed without the administration of intravenous contrast. COMPARISON: Same day CT head and MRI head 09/23/2023. CLINICAL HISTORY: Vision loss, known etiology. FINDINGS: BRAIN AND VENTRICLES: There is Fadia Marlar 4 mm focus of acute infarct within the right dorsal aspect of the midbrain partially involving the periaqueductal gray matter seen on series 2 image 22. No significant associated edema or mass effect. Remote lacunar infarcts are present in the bilateral basal ganglia and thalami, left greater than right, and in the right corona radiata. Volume loss in the left cerebral peduncle is likely related to prior infarcts. T2 and FLAIR hyperintensity in the periventricular and subcortical white  matter with additional signal abnormality in the pons is suggestive of moderate chronic microvascular ischemic changes. Multiple scattered chronic microhemorrhages are present, particularly within the thalami and brainstem, likely related to hypertensive encephalopathy. Few additional smaller chronic microhemorrhages are seen in the cerebral hemispheres. There is mild generalized parenchymal volume loss. No mass. No midline shift. No hydrocephalus. The sella is unremarkable. Normal flow voids. ORBITS: Bilateral lens replacement. SINUSES AND MASTOIDS: Mucosal thickening in the left sphenoid sinus. BONES AND SOFT TISSUES: Normal marrow signal. Degenerative changes in the visualized upper cervical spine. Prominent signal abnormality at C4-C5 likely reflecting chronic degenerative endplate changes. No acute soft tissue abnormality. IMPRESSION: 1. Acute infarct in the right dorsal midbrain measuring 4 mm, partially involving the periaqueductal gray matter. No significant edema or mass effect. 2. Multiple scattered chronic microhemorrhages, particularly within the thalami and brainstem, likely related to hypertensive encephalopathy. 3. Moderate chronic microvascular ischemic changes, mild generalized parenchymal volume loss, remote lacunar infarcts as above. Electronically signed by: Donnice Mania MD 04/23/2024 06:01 PM EST RP Workstation: [id number]   CT HEAD WO CONTRAST Result Date: 04/23/2024 EXAM: CT HEAD WITHOUT CONTRAST 04/23/2024 02:19:26 PM TECHNIQUE: CT of the head was performed without the administration of intravenous contrast. Automated exposure control, iterative reconstruction, and/or weight based adjustment of the mA/kV was utilized to reduce the radiation dose to as low as reasonably achievable. COMPARISON: 09/23/2023 CLINICAL HISTORY: Neuro deficit, acute, stroke suspected. FINDINGS: BRAIN AND VENTRICLES: No acute hemorrhage. No evidence of acute infarct. Patchy and confluent decreased attenuation  throughout deep and periventricular white matter of cerebral hemispheres bilaterally, compatible with chronic microvascular ischemic disease. Chronic right corona radiata, left basal ganglia lacunar infarcts. No hydrocephalus. No extra-axial collection. No mass effect or midline shift. ORBITS: Bilateral lens replacement. SINUSES: Left sphenoid sinus mucosal thickening. SOFT TISSUES AND SKULL: No acute soft tissue abnormality. No skull fracture. VASCULATURE: Atherosclerotic calcifications within cavernous internal carotid arteries. IMPRESSION: 1. No acute intracranial abnormality. 2. Moderate chronic microvascular ischemic disease. Chronic right corona radiata and left basal ganglia lacunar infarcts. Electronically signed by: Donnice Mania MD 04/23/2024 02:41 PM EST RP Workstation: [id number]    Microbiology: No results found for this or any previous visit (from the past 240 hours).   Labs: Basic Metabolic Panel: Recent Labs  Lab 04/25/24 2349 04/27/24 0232 04/29/24 0634 04/30/24 0338  NA 136 133* 134* 133*  K 4.8 4.8 4.5 5.0  CL 98 97* 96* 95*  CO2 29 31 29 30   GLUCOSE 141* 242* 158* 174*  BUN 33* 25* 30* 38*  CREATININE 6.93* 5.95* 6.09* 7.91*  CALCIUM  8.6* 8.5* 9.4 9.0  MG  --  2.3  --  2.4  PHOS 5.7* 4.5  --  4.7*   Liver Function Tests: Recent Labs  Lab 04/25/24 2349 04/30/24 0338  AST  --  13*  ALT  --  8  ALKPHOS  --  38  BILITOT  --  1.0  PROT  --  6.4*  ALBUMIN 3.0* 3.0*   No results for input(s): LIPASE, AMYLASE in the last 168 hours. No results for input(s): AMMONIA in the last 168 hours. CBC: Recent Labs  Lab 04/25/24 2349 04/27/24 0232 04/29/24 0634 04/30/24 0338  WBC 4.0 4.5 4.8 5.3  NEUTROABS  --   --   --  3.6  HGB 10.3* 10.0* 9.9* 9.8*  HCT 32.3* 31.0* 30.6* 31.1*  MCV 96.7 95.1 95.6 96.9  PLT 173 176 182 191   Cardiac Enzymes: No results for input(s): CKTOTAL, CKMB, CKMBINDEX, TROPONINI in the last 168 hours. BNP: BNP (last 3  results) No results for input(s): BNP in the last 8760 hours.  ProBNP (last 3 results) No results for input(s): PROBNP in the last 8760 hours.  CBG: Recent Labs  Lab 04/29/24 1150 04/29/24 1605 04/29/24 2103 04/30/24 0628 04/30/24 1335  GLUCAP 227* 106* 240* 159* 144*       Signed:  Meliton Monte MD.  [facility] 04/30/2024, 2:27 PM       [1]  Allergies Allergen Reactions   Ibuprofen Swelling    Swelling to hands, feet and face    Influenza Vaccines Anaphylaxis and Swelling    fever   Pneumococcal Vaccine Anaphylaxis and Swelling    fever   Promethazine Other (See Comments)    hallucinations   Ambien  [Zolpidem ] Other (See Comments)    Unknown reaction   Codeine Itching   Cyclobenzaprine Other (See Comments)    hallucinations    Hydrocodone -Acetaminophen  Itching    Takes now and has not had many issues.   Tape Itching   Tramadol  Hcl Other (See Comments)    hallucinations   Wound Dressing Adhesive Itching    Use paper tape   Oxycodone  Nausea And Vomiting

## 2024-04-30 NOTE — Progress Notes (Signed)
°   04/30/24 1251  Vitals  Temp 97.8 F (36.6 C)  Pulse Rate 85  Resp 11  BP (!) 154/63  SpO2 99 %  O2 Device Room Air  Oxygen Therapy  Patient Activity (if Appropriate) In bed  Pulse Oximetry Type Continuous  Oximetry Probe Site Changed No  Post Treatment  Dialyzer Clearance Lightly streaked  Hemodialysis Intake (mL) 0 mL  Liters Processed 54  Fluid Removed (mL) 1500 mL  Tolerated HD Treatment Yes  AVG/AVF Arterial Site Held (minutes) 10 minutes  AVG/AVF Venous Site Held (minutes) 10 minutes   Received patient in bed to unit.  Alert and oriented.  Informed consent signed and in chart.   TX duration:3.5  Patient tolerated well.  Transported back to the room  Alert, without acute distress.  Hand-off given to patient's nurse.   Access used: LUAF Access issues: no complications  Total UF removed: 1500 Medication(s) given: none   [facility]

## 2024-04-30 NOTE — Progress Notes (Signed)
 PMR Admission Coordinator Pre-Admission Assessment   Patient: Hannah [name] is an 58 y.o., female MRN: [id number] DOB: April 10, 1966 Height:   Weight: 46.7 kg   Insurance Information HMO: yes HMO POS    PPO:      PCP:      IPA:      80/20:      OTHER:  PRIMARY: UHC Medicare      Policy#: [id number]  ; Medicare [id number]    Subscriber: pt CM Name: UM       Phone#: [phone] option 3     Fax#: [phone] Pre-Cert#: [id number]  Auth for CIR from Darinae with [facility] Medicare for admit 04/30/24 with next review date 05/07/24.  Updates due to Um dept  at fax listed above.        Employer:  Benefits:  Phone #: [phone]     Name: 12/10 Eff. Date: 08/16/23 until 05/16/24     Deduct: none      Out of Pocket Max: $3900      Life Max: none CIR: $300 co pay per day days 1 until 5      SNF: no c copay days 1 until 20; $203 co pay per day days 21 until 100 Outpatient: $20 co pay per visit     Co-Pay:  Home Health: 100%      Co-Pay: visits limited by medical neccesity DME: 80%     Co-Pay: 20% Providers: in network  SECONDARY: none      Policy#:      Phone#:    Artist:       Phone#:  Daughter has tried to apply for [employer] but is Armed Forces Training And Education Officer for patients in Inpatient Rehabilitation Facilities with attached Privacy Act Statement-Health Care Records was provided and verbally reviewed with: Patient and Family   Emergency Contact Information Contact Information       Name Relation Home Work Mobile    [place] Daughter [phone]   [phone]    Mannie Callas Daughter [phone]   [phone]         Other Contacts       Name Relation Home Work Mobile    [place] Daughter     [phone]         Current Medical History  Patient Admitting Diagnosis: CVA   History of Present Illness: 58 yo female with historyof CAD s/p PCI, CVA ESRD on hemodialysis, HTN, HLD who presented on 12/8 with visual changes and aphasia.  Has residual right sided hemiparesis from prior CVA.    Imaging revealed acute tight midbrain infarct felt likely from small vessel disease. MRI acute tight dorsal midbrain infarct. CTA bilateral carotid plaque < 50 % stenosis. 2 d echo EF 65 to 70%. Heparin  SQ for VTE prophylaxis. Clopidogrel  prior to admit. Now on asa and Brilinta  for 30 days and then ASA indefinitely.    Hb A1c 8.5 currently on insulin . CBG monitoring and SSI. DM education and close PCP follow up. HTN on home amlodipine , coreg , hydralazine  and Imdur . LDL 30 on home Crestor .    Complete NIHSS TOTAL: 4   Patient[facility] has been reviewed by the rehabilitation admission coordinator and physician.   Past Medical History      Past Medical History:  Diagnosis Date   CAD S/P PCI x 2 2009    MI 2009 -> PCI x 2 ([place]); 01/2016 Nuc ST Nonischemic with Normal EF   Cerebral infarction (HCC) 07/12/2017  2009 first one, has had a total of 4 stokes- last 2021   Chronic kidney disease      Hemo M-W- F   Diabetes type 2, controlled (HCC)     Hemiparesis affecting right side as late effect of cerebrovascular accident (CVA) (HCC)     Hyperlipidemia     Hypertension     Intractable vomiting with nausea 07/12/2017   Multiple sclerosis 2009   Myocardial infarction [facility]) 2009   Stroke [facility])          Has the patient had major surgery during 100 days prior to admission? No   Family History   family history is not on file.   Current Medications [Current Medications]  [Current Medications]   Current Facility-Administered Medications:    amLODipine  (NORVASC ) tablet 10 mg, 10 mg, Oral, Daily, Jerri Pfeiffer, MD, 10 mg at 04/29/24 0858   aspirin  EC tablet 81 mg, 81 mg, Oral, Daily, Khatri, Pardeep, MD, 81 mg at 04/29/24 0859   azaTHIOprine  (IMURAN ) tablet 50 mg, 50 mg, Oral, Daily, Dorrell, Robert, MD, 50 mg at 04/29/24 0859   calcium  acetate (PHOSLO ) capsule 667 mg, 667 mg, Oral, TID WC,  Ejigiri, Ogechi Grace, PA-C, 667 mg at 04/29/24 1607   carvedilol  (COREG ) tablet 25 mg, 25 mg, Oral, BID WC, Dorrell, Robert, MD, 25 mg at 04/29/24 1608   Chlorhexidine  Gluconate Cloth 2 % PADS 6 each, 6 each, Topical, Q0600, Leotis Bogus, MD, 6 each at 04/30/24 0636   Chlorhexidine  Gluconate Cloth 2 % PADS 6 each, 6 each, Topical, Q0600, Collins, Samantha G, PA-C, 6 each at 04/30/24 0636   heparin  injection 5,000 Units, 5,000 Units, Subcutaneous, Q8H, Dorrell, Robert, MD, 5,000 Units at 04/30/24 9371   hydrALAZINE  (APRESOLINE ) tablet 50 mg, 50 mg, Oral, TID, Leotis, Pardeep, MD, 50 mg at 04/29/24 1607   HYDROcodone -acetaminophen  (NORCO/VICODIN) 5-325 MG per tablet 1 tablet, 1 tablet, Oral, Q12H PRN, Rathore, Vasundhra, MD, 1 tablet at 04/29/24 0858   insulin  aspart (novoLOG ) injection 0-5 Units, 0-5 Units, Subcutaneous, QHS, Khatri, Pardeep, MD, 2 Units at 04/29/24 2149   insulin  aspart (novoLOG ) injection 0-9 Units, 0-9 Units, Subcutaneous, TID WC, Leotis Bogus, MD, 2 Units at 04/30/24 9366   insulin  glargine (LANTUS ) injection 10 Units, 10 Units, Subcutaneous, Daily, Leotis, Pardeep, MD, 10 Units at 04/29/24 2150   isosorbide  mononitrate (IMDUR ) 24 hr tablet 30 mg, 30 mg, Oral, QHS, Dorrell, Robert, MD, 30 mg at 04/29/24 2155   melatonin tablet 3 mg, 3 mg, Oral, QHS PRN, Rathore, Vasundhra, MD, 3 mg at 04/25/24 0013   naloxone  (NARCAN ) injection 0.4 mg, 0.4 mg, Intravenous, PRN, Rathore, Vasundhra, MD   ondansetron  (ZOFRAN ) injection 4 mg, 4 mg, Intravenous, Q6H PRN, Perri DELENA Meliton Mickey., MD, 4 mg at 04/29/24 9142   Oral care mouth rinse, 15 mL, Mouth Rinse, PRN, Leotis, Pardeep, MD   polyethylene glycol (MIRALAX  / GLYCOLAX ) packet 17 g, 17 g, Oral, BID, Perri DELENA Meliton Mickey., MD   rosuvastatin  (CRESTOR ) tablet 20 mg, 20 mg, Oral, Daily, Dorrell, Robert, MD, 20 mg at 04/29/24 9140   ticagrelor  (BRILINTA ) tablet 90 mg, 90 mg, Oral, BID, Jerri Pfeiffer, MD, 90 mg at 04/29/24 2155   Patients  Current Diet:  Diet Order                  DIET SOFT Room service appropriate? Yes; Fluid consistency: Thin  Diet effective now  Precautions / Restrictions Precautions Precautions: Fall Precaution/Restrictions Comments: residual R hemiparesis Restrictions Weight Bearing Restrictions Per Provider Order: No    Has the patient had 2 or more falls or a fall with injury in the past year? No   Prior Activity Level Limited Community (1-2x/wk): needed assist with short distance with RW and long distance w/c   Prior Functional Level Self Care: Did the patient need help bathing, dressing, using the toilet or eating? Needed some help   Indoor Mobility: Did the patient need assistance with walking from room to room (with or without device)? Needed some help   Stairs: Did the patient need assistance with internal or external stairs (with or without device)? Needed some help   Functional Cognition: Did the patient need help planning regular tasks such as shopping or remembering to take medications? Needed some help   Patient Information Are you of Hispanic, Latino/a,or Spanish origin?: A. No, not of Hispanic, Latino/a, or Spanish origin What is your race?: B. Black or African American Do you need or want an interpreter to communicate with a doctor or health care staff?: 0. No   Patient's Response To:  Health Literacy and Transportation Is the patient able to respond to health literacy and transportation needs?: Yes Health Literacy - How often do you need to have someone help you when you read instructions, pamphlets, or other written material from your doctor or pharmacy?: Sometimes In the past 12 months, has lack of transportation kept you from medical appointments or from getting medications?: No In the past 12 months, has lack of transportation kept you from meetings, work, or from getting things needed for daily living?: No   Home Assistive Devices /  Equipment Home Equipment: Tub bench, Agricultural Consultant (2 wheels), BSC/3in1, Wheelchair - manual, [employer] - single point   Prior Device Use: Indicate devices/aids used by the patient prior to current illness, exacerbation or injury? Walker and RW short distances   Current Functional Level Cognition   Arousal/Alertness: Awake/alert Overall Cognitive Status: History of cognitive impairments - at baseline Orientation Level: Oriented X4 Attention: Sustained Sustained Attention: Appears intact Memory: Appears intact (for 4 word recall) Awareness:  [facility]) Problem Solving: Appears intact (for basic in acute setting) Safety/Judgment:  (needs assist at baseline)    Extremity Assessment (includes Sensation/Coordination)   Upper Extremity Assessment: Right hand dominant, RUE deficits/detail, LUE deficits/detail RUE Deficits / Details: residual R hemiparesis from prior CVA, shoulder flexion ~3/5, elbow flexion/extension ~3+/5, grip strength ~3+/5 RUE Sensation: WNL RUE Coordination: decreased fine motor, decreased gross motor LUE Deficits / Details: grossly 4-/5 to MMT  Lower Extremity Assessment: Defer to PT evaluation RLE Deficits / Details: ROM WFL, strength grossly 3+/5 LLE Deficits / Details: ROM WFL, strength grossly 4-/5     ADLs   Overall ADL's : Needs assistance/impaired Eating/Feeding: Set up, Bed level Eating/Feeding Details (indicate cue type and reason): observation, feeding self primarily with L hand Grooming: Minimal assistance Upper Body Bathing: Moderate assistance Lower Body Bathing: Total assistance Upper Body Dressing : Moderate assistance Lower Body Dressing: Moderate assistance, Sitting/lateral leans, Cueing for compensatory techniques, Cueing for sequencing Lower Body Dressing Details (indicate cue type and reason): seated eob with trunk support from therapist asst. pt. able to position herself in figure four with each LE,  initial portion of sock donned and pt.able to reach  and finish pulling sock up with B hands. Toileting- Clothing Manipulation and Hygiene: Total assistance Toileting - Clothing Manipulation Details (indicate cue type and reason): purewick  intact General ADL Comments: able to don socks with assistance, educatoin and review of rec. for use of B hands during adls to increase/maintain functional use of BUEs.     Mobility   Overal bed mobility: Needs Assistance Bed Mobility: Rolling, Sidelying to Sit, Sit to Sidelying Rolling: Min assist, Used rails Sidelying to sit: Mod assist, HOB elevated, Used rails Supine to sit: Max assist, HOB elevated, Used rails Sit to sidelying: Mod assist, Used rails, HOB elevated General bed mobility comments: rolled to L in sidelying with use of BUES to reach for rail. mod a to guide trunk upright for sitting eob. back to bed able to transition into left sidelying with use of bed rail, assistance to guide BLEs into bed     Transfers   Overall transfer level: Needs assistance Equipment used: Rolling walker (2 wheels) Transfers: Sit to/from Stand Sit to Stand: Mod assist Bed to/from chair/wheelchair/BSC transfer type:: Stand pivot Stand pivot transfers: Mod assist General transfer comment: cues for hand placement with ModA to boost-up and steady. Posterior bias with frequent cueing to lean forwards. Cues for upright posture as pt tended to move into forward flexed position.     Ambulation / Gait / Stairs / Wheelchair Mobility   Ambulation/Gait Ambulation/Gait assistance: Mod assist Gait Distance (Feet): 2 Feet Assistive device: Rolling walker (2 wheels) Gait Pattern/deviations: Step-to pattern, Decreased step length - right, Decreased step length - left, Decreased stance time - right, Decreased stance time - left, Decreased dorsiflexion - right, Decreased dorsiflexion - left General Gait Details: able to sideways step towards [facility] with ModA for RW management and to correct posterior lean. Increased time and  effort Gait velocity: decr Gait velocity interpretation: <1.31 ft/sec, indicative of household ambulator     Posture / Balance Dynamic Sitting Balance Sitting balance - Comments: intial L lean able to correct with verbal cues.  initiallly requiring ue support on bed rail, after LB dressing tasks noted balance improvment and pt. able to sit in un supported sitting w/o lean Balance Overall balance assessment: Needs assistance Sitting-balance support: Single extremity supported, Feet unsupported Sitting balance-Leahy Scale: Poor Sitting balance - Comments: intial L lean able to correct with verbal cues.  initiallly requiring ue support on bed rail, after LB dressing tasks noted balance improvment and pt. able to sit in un supported sitting w/o lean Postural control: Posterior lean, Left lateral lean Standing balance support: Bilateral upper extremity supported, Reliant on assistive device for balance Standing balance-Leahy Scale: Poor Standing balance comment: heavily reliant on UE and external support. Posterior lean throughout with minimal correction with cueing     Special considerations/life events  Daughter transports her to hemodialysis Hgb A1c 8.5 OP HD MWF SW [place] 0715 chair time    Previous Home Environment  Living Arrangements:  (adult son and daughter. Dtr 2 and son 44 yo)  Lives With: Daughter Available Help at Discharge: Family, Available 24 hours/day Type of Home: Apartment Home Layout: One level Home Access: Stairs to enter Entrance Stairs-Rails: None Entrance Stairs-Number of Steps: threshold Bathroom Shower/Tub: Associate Professor: Yes How Accessible: Accessible via walker Home Care Services: No Additional Comments: Dtr states over 1 1/2 years, patient more dependent on her for adls, etc and she has taken the role on to be more efficient. DTr would like ot see her increase her funtional level   Discharge Living  Setting Plans for Discharge Living Setting: Lives with (comment), Apartment (adult son and dtr) Type of Home  at Discharge: Apartment Discharge Home Layout: One level Discharge Home Access: Stairs to enter Entrance Stairs-Rails: None Entrance Stairs-Number of Steps: threshold. sidewalk leading into apartment has 2 inclines Discharge Bathroom Shower/Tub: Tub/shower unit Discharge Bathroom Toilet: Standard Discharge Bathroom Accessibility: Yes How Accessible: Accessible via walker Does the patient have any problems obtaining your medications?: No   Social/Family/Support Systems Patient Roles: Parent Contact Information: son and dtr Anticipated Caregiver: son and dtr Anticipated Caregiver's Contact Information: see contacts Ability/Limitations of Caregiver: dtr works at her church , but son with patietn when dtr works Engineer, Structural Availability: 24/7 Discharge Plan Discussed with Primary Caregiver: Yes Is Caregiver In Agreement with Plan?: Yes Does Caregiver/Family have Issues with Lodging/Transportation while Pt is in Rehab?: No   Goals Patient/Family Goal for Rehab: supervision to min PT and OT and supervision SLP Expected length of stay: ELOS 10 to 14 days Additional Information: Daughter transports her to OP hemodialysis Pt/Family Agrees to Admission and willing to participate: Yes Program Orientation Provided & Reviewed with Pt/Caregiver Including Roles  & Responsibilities: Yes Additional Information Needs: Has been to CIR twice in 2022 and went home   Decrease burden of Care through IP rehab admission: n/a   Possible need for SNF placement upon discharge: not anticipated   Patient Condition: This patient's condition remains as documented in the consult dated 04/25/24, in which the Rehabilitation Physician determined and documented that the patient's condition is appropriate for intensive rehabilitative care in an inpatient rehabilitation facility. Will admit to inpatient rehab  today.   Preadmission Screen Completed By:  Leita KATHEE Kleine, RN MSN 04/30/2024 11:47 AM ______________________________________________________________________   Discussed status with Dr. Carilyn on 04/30/24 at 900 and received approval for admission today.   Admission Coordinator:  Leita KATHEE Kleine RAEJEAN MSN time 1147/Date 04/30/24    Assessment/Plan: Diagnosis: Right dorsal midbrain infarct Does the need for close, 24 hr/day Medical supervision in concert with the patient's rehab needs make it unreasonable for this patient to be served in a less intensive setting? No Co-Morbidities requiring supervision/potential complications: DM2, ESRD on HD, HTN, chronic Sacroiliac and coccygeal pain  Due to bowel management, safety, skin/wound care, disease management, medication administration, pain management, and patient education, does the patient require 24 hr/day rehab nursing? Yes Does the patient require coordinated care of a physician, rehab nurse, PT, OT, and SLP to address physical and functional deficits in the context of the above medical diagnosis(es)? Yes Addressing deficits in the following areas: balance, endurance, locomotion, strength, transferring, bathing, dressing, toileting, and psychosocial support Can the patient actively participate in an intensive therapy program of at least 3 hrs of therapy 5 days a week? Yes The potential for patient to make measurable gains while on inpatient rehab is good Anticipated functional outcomes upon discharge from inpatient rehab: supervision and min assist PT, supervision and min assist OT, supervision SLP Estimated rehab length of stay to reach the above functional goals is: 10-14d Anticipated discharge destination: Home 10. Overall Rehab/Functional Prognosis: good     MD Signature: Prentice CHARLENA Carilyn M.D. [facility] Fellow Am Acad of Phys Med and Rehab Diplomate Am Board of Electrodiagnostic Med Fellow Am Board of  Interventional Pain            Revision History  Date/Time User Provider Type Action  04/30/2024 12:03 PM Kirsteins, Prentice BRAVO, MD Physician Sign  04/30/2024 11:48 AM Kleine Leita KATHEE, CCC-SLP Rehab Admission Coordinator Share  04/27/2024  4:35 PM Alison Heron MATSU, RN Rehab Admission Coordinator Share  04/27/2024  3:09 PM Alison Heron MATSU, RN Rehab Admission Coordinator Share  04/26/2024  8:55 AM Alison, Heron MATSU, RN Rehab Admission Coordinator

## 2024-04-30 NOTE — H&P (Signed)
 Physical Medicine and Rehabilitation Admission H&P        Chief Complaint  Patient presents with   Functional deficits due to new stroke and history of prior stroke with right sided weakness      HPI:  Hannah [name] is a 58 year old RH-female with history of CVA with chronic right sided weakness, CAD s/p PCI, HTN, MS, chorioretinitis, chronic LBP, T2DM- HD MWF wjp was admitted on 04/23/24 with onset peripheral vision loss, left facial weakness with left sided weakness and aphasia. MRI brain done revealing acute infarct right dorsal midbrain and multiple scattered chronic microhemorrhages particularly within the thalami and brain stem. CTA  head/neck negative for  intracranial disease or stenosis.  2 D echo showed EF 65-70% with no wall abnormalities and small pericardial effusion. Dr. Jerri felt that stroke was due to small vessel disease and Plavix  changed to Brillinta and ASA added with recommendations to continue for 30 days then change to ASA 81 mg indefinitely.  P2Y12 level 229 (normal).   HD on going MWF and she has had some issues with prolonged bleeding from AVG site. Anemia being monitored and BP control improving. PT/OT/ST has been working with patient who requires mod to max assist with ADLs with cues to compensated for left lean, dysarthria is at baseline and requires mod assist for mobility and able to take a couple of steps. At baseline she was independent at wheelchair level and was able to walk a few steps with use of walker. CIR recommended due to functional decline.       Review of Systems  Constitutional:  Negative for fever.  HENT:  Negative for hearing loss.   Eyes:  Positive for blurred vision. Negative for pain.       Vision worse since this stroke  Respiratory:  Negative for cough.   Cardiovascular:  Positive for chest pain (during HD--managed with adjusting rate).  Gastrointestinal:  Positive for constipation.  Neurological:  Positive for weakness.  Psychiatric/Behavioral:   The patient has insomnia.            Past Medical History:  Diagnosis Date   CAD S/P PCI x 2 2009    MI 2009 -> PCI x 2 ([place]); 01/2016 Nuc ST Nonischemic with Normal EF   Cerebral infarction (HCC) 07/12/2017    2009 first one, has had a total of 4 stokes- last 2021   Chronic kidney disease      Hemo M-W- F   Diabetes type 2, controlled (HCC)     Hemiparesis affecting right side as late effect of cerebrovascular accident (CVA) (HCC)     Hyperlipidemia     Hypertension     Intractable vomiting with nausea 07/12/2017   Multiple sclerosis 2009   Myocardial infarction [facility]) 2009   Stroke [facility])                 Past Surgical History:  Procedure Laterality Date   A/V FISTULAGRAM Right 02/12/2021    Procedure: A/V FISTULAGRAM;  Surgeon: Gretta Lonni PARAS, MD;  [facility]: [facility] INVASIVE CV LAB;  Service: Cardiovascular;  Laterality: Right;   A/V FISTULAGRAM Right 05/20/2022    Procedure: A/V Fistulagram;  Surgeon: Gretta Lonni PARAS, MD;  Location: [facility] INVASIVE CV LAB;  Service: Cardiovascular;  Laterality: Right;   A/V FISTULAGRAM N/A 05/05/2023    Procedure: A/V Fistulagram;  Surgeon: Melia Lynwood ORN, MD;  Location: [facility] INVASIVE CV LAB;  Service: Cardiovascular;  Laterality: N/A;   A/V SHUNT INTERVENTION N/A  08/16/2023    Procedure: A/V SHUNT INTERVENTION;  Surgeon: Tobie Gordy POUR, MD;  Location: [facility] INVASIVE CV LAB;  Service: Cardiovascular;  Laterality: N/A;   A/V SHUNTOGRAM Right 09/26/2023    Procedure: A/V SHUNTOGRAM;  Surgeon: Pearline Norman RAMAN, MD;  Location: [facility] INVASIVE CV LAB;  Service: Cardiovascular;  Laterality: Right;   APPENDECTOMY       AV FISTULA PLACEMENT Right 10/02/2020    Procedure: RIGHT ARTERIOVENOUS (AV) FISTULA CREATION;  Surgeon: Sheree Penne Bruckner, MD;  Location: [facility] OR;  Service: Vascular;  Laterality: Right;   AV FISTULA PLACEMENT Right 05/28/2022    Procedure: RIGHT ARM ARTERIOVENOUS (AV) FISTULA REVISION WITH GORETEX GRAFT;  Surgeon: Gretta Bruckner PARAS, MD;  Location: [facility] OR;  Service: Vascular;  Laterality: Right;   BASCILIC VEIN TRANSPOSITION Right 11/24/2020    Procedure: SECOND STAGE RIGHT ARM BASCILIC VEIN TRANSPOSITION;  Surgeon: Gretta Bruckner PARAS, MD;  Location: [facility] OR;  Service: Vascular;  Laterality: Right;   CYSTOSTOMY W/ BLADDER BIOPSY       INSERTION OF DIALYSIS CATHETER Right 05/28/2022    Procedure: INSERTION OF TUNNELED DIALYSIS CATHETER IN THE RIGHT INTERNAL JUGULAR;  Surgeon: Gretta Bruckner PARAS, MD;  Location: [facility] OR;  Service: Vascular;  Laterality: Right;   IR FLUORO GUIDE CV LINE RIGHT   09/29/2020   IR US  GUIDE VASC ACCESS RIGHT   09/29/2020   NM MYOVIEW  LTD   12/25/2015    [place], [place] - NORMAL /NEGATIVE for ischemia. Normal LV Fxn   OVARIAN CYST SURGERY       PERCUTANEOUS CORONARY STENT INTERVENTION (PCI-S)   2009    [place], [place] - 2 stents in settng of MI -- unknown details   PERIPHERAL VASCULAR BALLOON ANGIOPLASTY Right 02/12/2021    Procedure: PERIPHERAL VASCULAR BALLOON ANGIOPLASTY;  Surgeon: Gretta Bruckner PARAS, MD;  [facility];  Service: Cardiovascular;  Laterality: Right;  UPPER ARM FISTULA   PERIPHERAL VASCULAR BALLOON ANGIOPLASTY   05/20/2022    Procedure: PERIPHERAL VASCULAR BALLOON ANGIOPLASTY;  Surgeon: Gretta Bruckner PARAS, MD;  [facility] IN[facility] LAB;  Service: Cardiovascular;;  rt arm fistula site   PERIPHERAL VASCULAR BALLOON ANGIOPLASTY Right 01/28/2022    Procedure: PERIPHERAL VASCULAR BALLOON ANGIOPLASTY;  Surgeon: Gretta Bruckner PARAS, MD;  [facility];  Service: Cardiovascular;  Laterality: Right;  Arm fistula   PERIPHERAL VASCULAR BALLOON ANGIOPLASTY   05/05/2023    Procedure: PERIPHERAL VASCULAR BALLOON ANGIOPLASTY;  Surgeon: Melia Lynwood ORN, MD;  [facility]: [facility] INVAS[facility] CV LAB;  Service: Cardiovascular;;  RUA AVF   TRANSTHORACIC ECHOCARDIOGRAM   07/11/2017    a) 12/2015, [place], [place]): Moderate Conc LVH w/ sigmoid septum.  No LVOT  obstruction.  Hyperdynamic LV fxn - EF 65 to 70%.  GR 1 DD & high LAP/LVEDP.  AoV Sclerosis - No AS;; b) 06/2017: [facility]): Normal LV Fxn - EF 60%.  Abnormal septal motion.  Mild LVH mild MAC.  Negative bubble study (for CVA)   TRANSTHORACIC ECHOCARDIOGRAM   12/2020    a) 05/2020: EF 60-65%. No RWMA. Gr2DD. Sm posterior pericardial effusion. No AS.; b) 09/2020: EF 70-75%.  Hyperdynamic.  No RWMA. ? D Fxn/LAP.  Mildly elevated PAP.  Pericardial effusion.  Mild ao V sclerosis cyst.; c) 12/2020 (Limited): EF 65-70%. No RWMA. Gr1-2 DD. Mild Conc LVH- high LAP. Nl PAP. Circumferential effusion w/o tamponade. AoV Sclerosis - no AS. Nl RAP.   ULTRASOUND GUIDANCE FOR VASCULAR ACCESS Right 05/28/2022    Procedure: ULTRASOUND  GUIDANCE FOR VASCULAR ACCESS;  Surgeon: Gretta Lonni PARAS, MD;  Location: [facility] OR;  Service: Vascular;  Laterality: Right;   VENOUS ANGIOPLASTY Right 08/16/2023    Procedure: VENOUS ANGIOPLASTY;  Surgeon: Tobie Gordy POUR, MD;  Location: [facility] INVASIVE CV LAB;  Service: Cardiovascular;  Laterality: Right;  70% Innominate; 70% Outflow Basilic          History reviewed. No pertinent family history.         Social History:  reports that she has never smoked. She has never used smokeless tobacco. She reports that she does not currently use alcohol. She reports that she does not use drugs.      [Allergies]  [Allergies]      Allergen Reactions   Ibuprofen Swelling      Swelling to hands, feet and face     Influenza Vaccines Anaphylaxis and Swelling      fever   Pneumococcal Vaccine Anaphylaxis and Swelling      fever   Promethazine Other (See Comments)      hallucinations   Ambien  [Zolpidem ] Other (See Comments)      Unknown reaction   Codeine Itching   Cyclobenzaprine Other (See Comments)      hallucinations     Hydrocodone -Acetaminophen  Itching      Takes now and has not had many issues.   Tape Itching   Tramadol  Hcl Other (See Comments)       hallucinations   Wound Dressing Adhesive Itching      Use paper tape   Oxycodone  Nausea And Vomiting             Medications Prior to Admission  Medication Sig Dispense Refill   acetaminophen  (TYLENOL ) 500 MG tablet Take 500 mg by mouth every 6 (six) hours as needed for mild pain (pain score 1-3), moderate pain (pain score 4-6), fever or headache.       amLODipine  (NORVASC ) 10 MG tablet Take 1 tablet (10 mg total) by mouth daily. Except on dialysis days do not take a dose. 30 tablet 0   [EXPIRED] azaTHIOprine  (IMURAN ) 50 MG tablet Take 50 mg by mouth daily.       calcium  acetate (PHOSLO ) 667 MG capsule Take 667 mg by mouth in the morning, at noon, and at bedtime.       carvedilol  (COREG ) 25 MG tablet Take 1 tablet (25 mg total) by mouth 2 (two) times daily with a meal. And as directed. 60 tablet 0   clopidogrel  (PLAVIX ) 75 MG tablet Take 1 tablet (75 mg total) by mouth daily. 30 tablet 0   diclofenac  Sodium (VOLTAREN ) 1 % GEL Apply 2 g topically 4 (four) times daily. To leg (Patient taking differently: Apply 2 g topically 4 (four) times daily as needed (pain). To leg) 350 g 0   famotidine  (PEPCID ) 20 MG tablet Take 1 tablet (20 mg total) by mouth daily. 30 tablet 0   hydrALAZINE  (APRESOLINE ) 25 MG tablet Take 1 tablet (25 mg total) by mouth 2 (two) times daily. 180 tablet 3   HYDROcodone -acetaminophen  (NORCO/VICODIN) 5-325 MG tablet Take 1 tablet by mouth every 12 (twelve) hours as needed for moderate pain (pain score 4-6) or severe pain (pain score 7-10).       insulin  degludec (TRESIBA  FLEXTOUCH) 100 UNIT/ML FlexTouch Pen Inject 12 Units into the skin at bedtime. (Patient taking differently: Inject 8-10 Units into the skin at bedtime.) 15 mL 0   insulin  lispro (HUMALOG ) 100 UNIT/ML KwikPen Inject 5 Units into the  skin 3 (three) times daily. (Patient taking differently: Inject 4-6 Units into the skin 3 (three) times daily.) 15 mL 0   isosorbide  mononitrate (IMDUR ) 30 MG 24 hr tablet Take 1  tablet (30 mg total) by mouth at bedtime. 90 tablet 3   lidocaine -prilocaine  (EMLA ) cream Apply 1 Application topically daily as needed (port access).       metoCLOPramide  (REGLAN ) 5 MG tablet Take 5 mg by mouth every 6 (six) hours as needed for nausea or vomiting.       naloxone  (NARCAN ) nasal spray 4 mg/0.1 mL Place 0.4 mg into the nose as needed.       nitroGLYCERIN  (NITROSTAT ) 0.4 MG SL tablet Place 1 tablet (0.4 mg total) under the tongue every 5 (five) minutes as needed for chest pain. 30 tablet 12   ondansetron  (ZOFRAN ) 4 MG tablet Take 4 mg by mouth every 8 (eight) hours as needed for nausea or vomiting.       polyethylene glycol (MIRALAX  / GLYCOLAX ) 17 g packet Take 17 g by mouth daily. (Patient taking differently: Take 17 g by mouth daily as needed for moderate constipation.) 30 each 0   rosuvastatin  (CRESTOR ) 20 MG tablet Take 20 mg by mouth daily.       senna-docusate (SENOKOT-S) 8.6-50 MG tablet Take 1 tablet by mouth 2 (two) times daily as needed for moderate constipation. (Patient taking differently: Take 1 tablet by mouth 2 (two) times daily as needed for mild constipation or moderate constipation.) 60 tablet 0   Continuous Blood Gluc Sensor (FREESTYLE LIBRE 2 SENSOR) MISC USE AS DIRECTED CHANGE EVERY 14 DAYS                Home: Home Living Family/patient expects to be discharged to:: Private residence Living Arrangements:  (adult son and daughter. Dtr 46 and son 26 yo) Available Help at Discharge: Family, Available 24 hours/day Type of Home: Apartment Home Access: Stairs to enter [employer] of Steps: threshold Entrance Stairs-Rails: None Home Layout: One level Bathroom Shower/Tub: Engineer, Manufacturing Systems: Standard Bathroom Accessibility: Yes Home Equipment: Tub bench, Agricultural Consultant (2 wheels), BSC/3in1, Wheelchair - manual, Cane - single point Additional Comments: Dtr states over 1 1/2 years, patient more dependent on her for adls, etc and she has  taken the role on to be more efficient. DTr would like ot see her increase her funtional level  Lives With: Daughter   Functional History: Prior Function Prior Level of Function : Needs assist Mobility Comments: son or daughter assist the pt in stand pivot transfers to her manual wheelchair. Pt's MWC does not fit in the bathroom so she walks with min assistance for ~5-10' to reach the commode or tub ADLs Comments: family assisting with bathing and dressing but daughter feels like pt has potential to be able to do more. family assists with IADLs   Functional Status:  Mobility: Bed Mobility Overal bed mobility: Needs Assistance Bed Mobility: Rolling, Sidelying to Sit, Sit to Sidelying Rolling: Min assist, Used rails Sidelying to sit: Mod assist, HOB elevated, Used rails Supine to sit: Max assist, HOB elevated, Used rails Sit to sidelying: Mod assist, Used rails, HOB elevated General bed mobility comments: cues for log roll technique with pt able to reach for rail with L hand while protecting R UE. Assist to hook L ankle underneath R LE with slight assist to bring off EOB and to raise trunk. ModA for return to sidelying for LE management Transfers Overall transfer level: Needs assistance Equipment  used: Rolling walker (2 wheels) Transfers: Sit to/from Stand Sit to Stand: Mod assist Bed to/from chair/wheelchair/BSC transfer type:: Stand pivot Stand pivot transfers: Mod assist General transfer comment: cues for hand placement with ModA to boost-up and steady. Posterior bias with frequent cueing to lean forwards. Cues for upright posture as pt tended to move into forward flexed position. Ambulation/Gait Ambulation/Gait assistance: Mod assist Gait Distance (Feet): 2 Feet Assistive device: Rolling walker (2 wheels) Gait Pattern/deviations: Step-to pattern, Decreased step length - right, Decreased step length - left, Decreased stance time - right, Decreased stance time - left, Decreased  dorsiflexion - right, Decreased dorsiflexion - left General Gait Details: able to sideways step towards [facility] (2-Rh) with ModA for RW management and to correct posterior lean. Increased time and effort Gait velocity: decr Gait velocity interpretation: <1.31 ft/sec, indicative of household ambulator   ADL: ADL Overall ADL's : Needs assistance/impaired Eating/Feeding: Set up, Bed level Eating/Feeding Details (indicate cue type and reason): observation, feeding self primarily with L hand Grooming: Minimal assistance Upper Body Bathing: Moderate assistance Lower Body Bathing: Total assistance Upper Body Dressing : Moderate assistance Lower Body Dressing: Total assistance Lower Body Dressing Details (indicate cue type and reason): adjusting socks Toileting- Clothing Manipulation and Hygiene: Total assistance Toileting - Clothing Manipulation Details (indicate cue type and reason): purewick intact   Cognition: Cognition Overall Cognitive Status: History of cognitive impairments - at baseline Arousal/Alertness: Awake/alert Orientation Level: Oriented X4 Month: December Day of Week: Correct Attention: Sustained Sustained Attention: Appears intact Memory: Appears intact (for 4 word recall) Awareness:  [facility]) Problem Solving: Appears intact (for basic in acute setting) Safety/Judgment:  (needs assist at baseline) Cognition Arousal: Alert Behavior During Therapy: WFL for tasks assessed/performed Overall Cognitive Status: History of cognitive impairments - at baseline     Blood pressure 126/64, pulse 78, temperature 98.2 F (36.8 C), resp. rate 11, weight 44.3 kg, SpO2 100%. Physical Exam Vitals and nursing note reviewed.  Cardiovascular:     Heart sounds: Murmur heard.  Musculoskeletal:     Comments: BLE with muscle wasting. Tight heel cords on right.   Neurological:     Mental Status: She is alert and oriented to person, place, and time.     Comments: Clear but ataxic speech. Right sided  weakness with mild right facial weakness. She was able to follow simple motor commands. Decrease in fine motor movement RUE and contracture right elbow.    General: No acute distress Mood and affect are appropriate Heart: Regular rate and rhythm no rubs murmurs or extra sounds Lungs: Clear to auscultation, breathing unlabored, no rales or wheezes Abdomen: Positive bowel sounds, soft nontender to palpation, nondistended Extremities: No clubbing, cyanosis, or edema Skin: No evidence of breakdown, no evidence of rash Neurologic: Extraocular muscles intact, visual fields show bitemporal field loss, Motor strength left upper extremity 5/5 in the deltoid bicep tricep grip Motor strength left lower extremity 5/5 in the hip flexors knee extensors ankle dorsiflexor Motor strength in the right upper extremity limited by contracture at the right elbow and right shoulder 3 - at each muscle group 3 - at the tricep 3 - at the finger flexors and extensors Right lower extremity 3 - right hip flexor knee extensor 2 - ankle dorsiflexor Sensation intact to light touch bilateral upper and lower limb Cerebellar testing limited on the right due to weakness in the right upper extremity Intact left upper extremity Musculoskeletal: Full range of motion in all 4 extremities. No joint swelling Contracture at the right  shoulder only has 50% range with shoulder abduction forward flexion and external rotation Has 50% range at the right elbow, lacks approximately 60 degrees of extension Contracture right ankle unable to dorsiflex to neutral No pain with range of motion   Lab Results Last 48 Hours        Results for orders placed or performed during the hospital encounter of 04/23/24 (from the past 48 hours)  Glucose, capillary     Status: Abnormal    Collection Time: 04/25/24  4:31 PM  Result Value Ref Range    Glucose-Capillary 258 (H) 70 - 99 mg/dL      Comment: Glucose reference range applies only to samples taken  after fasting for at least 8 hours.  Glucose, capillary     Status: Abnormal    Collection Time: 04/25/24  9:09 PM  Result Value Ref Range    Glucose-Capillary 174 (H) 70 - 99 mg/dL      Comment: Glucose reference range applies only to samples taken after fasting for at least 8 hours.    Comment 1 Notify RN      Comment 2 Document in Chart    Renal function panel     Status: Abnormal    Collection Time: 04/25/24 11:49 PM  Result Value Ref Range    Sodium 136 135 - 145 mmol/L    Potassium 4.8 3.5 - 5.1 mmol/L    Chloride 98 98 - 111 mmol/L    CO2 29 22 - 32 mmol/L    Glucose, Bld 141 (H) 70 - 99 mg/dL      Comment: Glucose reference range applies only to samples taken after fasting for at least 8 hours.    BUN 33 (H) 6 - 20 mg/dL    Creatinine, Ser 3.06 (H) 0.44 - 1.00 mg/dL      Comment: DELTA CHECK NOTED    Calcium  8.6 (L) 8.9 - 10.3 mg/dL    Phosphorus 5.7 (H) 2.5 - 4.6 mg/dL    Albumin 3.0 (L) 3.5 - 5.0 g/dL    GFR, Estimated 6 (L) >60 mL/min      Comment: (NOTE) Calculated using the CKD-EPI Creatinine Equation (2021)      Anion gap 9 5 - 15      Comment: Performed at [facility], [address] [place], [place] [address]  CBC     Status: Abnormal    Collection Time: 04/25/24 11:49 PM  Result Value Ref Range    WBC 4.0 4.0 - 10.5 K/uL    RBC 3.34 (L) 3.87 - 5.11 MIL/uL    Hemoglobin 10.3 (L) 12.0 - 15.0 g/dL    HCT 67.6 (L) 63.9 - 46.0 %    MCV 96.7 80.0 - 100.0 fL    MCH 30.8 26.0 - 34.0 pg    MCHC 31.9 30.0 - 36.0 g/dL    RDW 83.7 (H) 88.4 - 15.5 %    Platelets 173 150 - 400 K/uL    nRBC 0.0 0.0 - 0.2 %      Comment: Performed at [facility], [address], [place], [place] [address]  Glucose, capillary     Status: None    Collection Time: 04/26/24  6:14 AM  Result Value Ref Range    Glucose-Capillary 94 70 - 99 mg/dL      Comment: Glucose reference range applies only to samples taken after fasting for at least 8 hours.    Comment 1 Notify RN  Comment 2 Document in Chart    Glucose, capillary     Status: Abnormal    Collection Time: 04/26/24 11:18 AM  Result Value Ref Range    Glucose-Capillary 170 (H) 70 - 99 mg/dL      Comment: Glucose reference range applies only to samples taken after fasting for at least 8 hours.  Glucose, capillary     Status: Abnormal    Collection Time: 04/26/24  5:06 PM  Result Value Ref Range    Glucose-Capillary 199 (H) 70 - 99 mg/dL      Comment: Glucose reference range applies only to samples taken after fasting for at least 8 hours.  Glucose, capillary     Status: Abnormal    Collection Time: 04/26/24  9:05 PM  Result Value Ref Range    Glucose-Capillary 324 (H) 70 - 99 mg/dL      Comment: Glucose reference range applies only to samples taken after fasting for at least 8 hours.    Comment 1 Notify RN      Comment 2 Document in Chart    Glucose, capillary     Status: Abnormal    Collection Time: 04/27/24  2:06 AM  Result Value Ref Range    Glucose-Capillary 250 (H) 70 - 99 mg/dL      Comment: Glucose reference range applies only to samples taken after fasting for at least 8 hours.  CBC     Status: Abnormal    Collection Time: 04/27/24  2:32 AM  Result Value Ref Range    WBC 4.5 4.0 - 10.5 K/uL    RBC 3.26 (L) 3.87 - 5.11 MIL/uL    Hemoglobin 10.0 (L) 12.0 - 15.0 g/dL    HCT 68.9 (L) 63.9 - 46.0 %    MCV 95.1 80.0 - 100.0 fL    MCH 30.7 26.0 - 34.0 pg    MCHC 32.3 30.0 - 36.0 g/dL    RDW 83.6 (H) 88.4 - 15.5 %    Platelets 176 150 - 400 K/uL    nRBC 0.0 0.0 - 0.2 %      Comment: Performed at [facility], [address], [place], [place] [address]  Magnesium      Status: None    Collection Time: 04/27/24  2:32 AM  Result Value Ref Range    Magnesium  2.3 1.7 - 2.4 mg/dL      Comment: Performed at [facility], [address] [place], [place] [address]  Basic metabolic panel with GFR     Status: Abnormal    Collection Time: 04/27/24  2:32 AM  Result Value Ref Range     Sodium 133 (L) 135 - 145 mmol/L    Potassium 4.8 3.5 - 5.1 mmol/L    Chloride 97 (L) 98 - 111 mmol/L    CO2 31 22 - 32 mmol/L    Glucose, Bld 242 (H) 70 - 99 mg/dL      Comment: Glucose reference range applies only to samples taken after fasting for at least 8 hours.    BUN 25 (H) 6 - 20 mg/dL    Creatinine, Ser 4.04 (H) 0.44 - 1.00 mg/dL    Calcium  8.5 (L) 8.9 - 10.3 mg/dL    GFR, Estimated 8 (L) >60 mL/min      Comment: (NOTE) Calculated using the CKD-EPI Creatinine Equation (2021)      Anion gap 5 5 - 15      Comment: Performed at [facility],  [address], [place], [place] [address]  Phosphorus     Status: None    Collection Time: 04/27/24  2:32 AM  Result Value Ref Range    Phosphorus 4.5 2.5 - 4.6 mg/dL      Comment: Performed at [facility], [address], [place], [place] [address]  Glucose, capillary     Status: Abnormal    Collection Time: 04/27/24  6:36 AM  Result Value Ref Range    Glucose-Capillary 168 (H) 70 - 99 mg/dL      Comment: Glucose reference range applies only to samples taken after fasting for at least 8 hours.    Comment 1 Notify RN      Comment 2 Document in Chart    Glucose, capillary     Status: Abnormal    Collection Time: 04/27/24 11:19 AM  Result Value Ref Range    Glucose-Capillary 239 (H) 70 - 99 mg/dL      Comment: Glucose reference range applies only to samples taken after fasting for at least 8 hours.    Comment 1 Notify RN        Imaging Results (Last 48 hours)  No results found.         Blood pressure 126/64, pulse 78, temperature 98.2 F (36.8 C), resp. rate 11, weight 44.3 kg, SpO2 100%.   Medical Problem List and Plan: 1. Functional deficits secondary to RIght dorsal midbrain infarct             -patient may  shower             -ELOS/Goals: 10-14d sup/minA 2.  Antithrombotics: -DVT/anticoagulation:  Pharmaceutical: Heparin              -antiplatelet therapy: ASA/Ticagrelor  3. H/o Sacroiliitis/Pain Management:  Tylenol  prn mild pain. --hydrocodone  5 mg (last filled 03/14/24 Dr. Rockey Pae ). . 4. Mood/Behavior/Sleep:  LCSW to follow evaluation and support             --Melatonin prn for insomina             -antipsychotic agents:  N/A   5. Neuropsych/cognition: This patient is capable of making decisions on her own behalf. 6. Skin/Wound Care: Routine pressure relief measures.  7. Fluids/Electrolytes/Nutrition:  Strict I/O. Daily weights. 1200 cc/FR, Add renal/CM restrictions 8. T2DM:  Hgb A1c-  8.5. Monitor BS ac/hs and use SSI for elevated BS.              --was on insulin  glargine 12 units w/ 5 units novolog  TID. Currently on 10 units insulin  glargine but BS ranging from 106-272.              --will continue to monitor BS for trend and titrate insulin  as needed.  9. ESRD: HD MWF at the end of the day to help with tolerance of therapy.              --Phoslo  tid ac for metabolic bone disease.  10. HTN: Monitor BP TID--on amlodipine , coreg , hydralazine  and imdur . 11. 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 .  12. Constipation: Has not had BM since admission. Was on miralax  PTA and Miralax  bid added today.              --sorbitol  45 cc today.  13. GERD: Took pepcid  daily--will resume.      Sharlet GORMAN Schmitz, PA-C 04/27/2024 I have personally performed a face to face diagnostic evaluation of this patient.  Additionally, I have reviewed and concur  with the physician assistant's documentation above. Prentice CHARLENA Compton M.D. [facility] Fellow [employer] Diplomate Am Board of Electrodiagnostic Med Fellow Am Board of Interventional Pain

## 2024-04-30 NOTE — Progress Notes (Signed)
 Signed     Expand All Collapse All           Physical Medicine and Rehabilitation Consult Reason for Consult: CVA Referring Physician: Darcel Dawley, MD     HPI: Hannah [name] is a 58 y.o. female who presented on 12/8 with visual changes and aphasia. MRI showed a R dorsal midbrain CVA. PMH includes CAD, CVA with residual right sided weakness, ESRD, HTN, HLD, MI, MS. Physical Medicine & Rehabilitation was consulted to assess candidacy for CIR.  She is currently ambulating Mod A 2 feet with the RW and transferring with Min to ModA with the RW. She is Min to Mod A for bed mobility.       Home: Home Living Family/patient expects to be discharged to:: Private residence Living Arrangements: Children Available Help at Discharge: Family, Available 24 hours/day Type of Home: Apartment Home Access: Stairs to enter [employer] of Steps: threshold Entrance Stairs-Rails: None Home Layout: One level Bathroom Shower/Tub: Engineer, Manufacturing Systems: Standard Home Equipment: Tub bench, Agricultural Consultant (2 wheels), BSC/3in1, Wheelchair - manual, Cane - single point  Functional History: Prior Function Prior Level of Function : Needs assist Mobility Comments: son or daughter assist the pt in stand pivot transfers to her manual wheelchair. Pt's MWC does not fit in the bathroom so she walks with min assistance for ~5-10' to reach the commode or tub ADLs Comments: family assisting with bathing and dressing but daughter feels like pt has potential to be able to do more. family assists with IADLs Functional Status:  Mobility: Bed Mobility Overal bed mobility: Needs Assistance Bed Mobility: Rolling, Sidelying to Sit, Sit to Sidelying Rolling: Min assist Sidelying to sit: Mod assist, HOB elevated Sit to sidelying: Mod assist Transfers Overall transfer level: Needs assistance Equipment used: 1 person hand held assist, Rolling walker (2 wheels) Transfers: Sit to/from Stand, Bed to  chair/wheelchair/BSC Sit to Stand: Min assist, From elevated surface Bed to/from chair/wheelchair/BSC transfer type:: Stand pivot Stand pivot transfers: Mod assist General transfer comment: assist via gait belt from PT, pt requries facilitation of pivot at hips by PT during stand pivot transfer to/from wheelchair. Ambulation/Gait Ambulation/Gait assistance: Mod assist Gait Distance (Feet): 2 Feet Assistive device: Rolling walker (2 wheels) Gait Pattern/deviations: Step-to pattern, Decreased step length - right, Decreased step length - left, Decreased stance time - right, Decreased stance time - left, Decreased dorsiflexion - right, Decreased dorsiflexion - left General Gait Details: pt with slowed step-to gait, reduced foot clearance bilaterally and increased time in dual support stance phase. Posterior lean prevalent which PT provides modA to correct constantly throughout gait trial. Distance limited due to MD arrival Gait velocity: reduced Gait velocity interpretation: <1.31 ft/sec, indicative of household ambulator   ADL:   Cognition: Cognition Orientation Level: Oriented X4 Cognition Arousal: Alert Behavior During Therapy: WFL for tasks assessed/performed     ROS +R>L sided wekaness     Past Medical History:  Diagnosis Date   CAD S/P PCI x 2 2009    MI 2009 -> PCI x 2 ([place]); 01/2016 Nuc ST Nonischemic with Normal EF   Cerebral infarction (HCC) 07/12/2017    2009 first one, has had a total of 4 stokes- last 2021   Chronic kidney disease      Hemo M-W- F   Diabetes type 2, controlled (HCC)     Hemiparesis affecting right side as late effect of cerebrovascular accident (CVA) (HCC)     Hyperlipidemia     Hypertension  Intractable vomiting with nausea 07/12/2017   Multiple sclerosis 2009   Myocardial infarction [facility]) 2009   Stroke [facility])               Past Surgical History:  Procedure Laterality Date   A/V FISTULAGRAM Right 02/12/2021    Procedure: A/V  FISTULAGRAM;  Surgeon: Gretta Lonni PARAS, MD;  Location: [facility] INVASIVE CV LAB;  Service: Cardiovascular;  Laterality: Right;   A/V FISTULAGRAM Right 05/20/2022    Procedure: A/V Fistulagram;  Surgeon: Gretta Lonni PARAS, MD;  Location: [facility] INVASIVE CV LAB;  Service: Cardiovascular;  Laterality: Right;   A/V FISTULAGRAM N/A 05/05/2023    Procedure: A/V Fistulagram;  Surgeon: Melia Lynwood ORN, MD;  Location: [facility] INVASIVE CV LAB;  Service: Cardiovascular;  Laterality: N/A;   A/V SHUNT INTERVENTION N/A 08/16/2023    Procedure: A/V SHUNT INTERVENTION;  Surgeon: Tobie Gordy POUR, MD;  Location: [facility] INVASIVE CV LAB;  Service: Cardiovascular;  Laterality: N/A;   A/V SHUNTOGRAM Right 09/26/2023    Procedure: A/V SHUNTOGRAM;  Surgeon: Pearline Norman RAMAN, MD;  Location: [facility] INVASIVE CV LAB;  Service: Cardiovascular;  Laterality: Right;   APPENDECTOMY       AV FISTULA PLACEMENT Right 10/02/2020    Procedure: RIGHT ARTERIOVENOUS (AV) FISTULA CREATION;  Surgeon: Sheree Penne Lonni, MD;  Location: [facility] OR;  Service: Vascular;  Laterality: Right;   AV FISTULA PLACEMENT Right 05/28/2022    Procedure: RIGHT ARM ARTERIOVENOUS (AV) FISTULA REVISION WITH GORETEX GRAFT;  Surgeon: Gretta Lonni PARAS, MD;  Location: [facility] OR;  Service: Vascular;  Laterality: Right;   BASCILIC VEIN TRANSPOSITION Right 11/24/2020    Procedure: SECOND STAGE RIGHT ARM BASCILIC VEIN TRANSPOSITION;  Surgeon: Gretta Lonni PARAS, MD;  Location: [facility] OR;  Service: Vascular;  Laterality: Right;   CYSTOSTOMY W/ BLADDER BIOPSY       INSERTION OF DIALYSIS CATHETER Right 05/28/2022    Procedure: INSERTION OF TUNNELED DIALYSIS CATHETER IN THE RIGHT INTERNAL JUGULAR;  Surgeon: Gretta Lonni PARAS, MD;  Location: [facility] OR;  Service: Vascular;  Laterality: Right;   IR FLUORO GUIDE CV LINE RIGHT   09/29/2020   IR US  GUIDE VASC ACCESS RIGHT   09/29/2020   NM MYOVIEW  LTD   12/25/2015    [place], [place] - NORMAL /NEGATIVE for ischemia. Normal LV Fxn   OVARIAN CYST  SURGERY       PERCUTANEOUS CORONARY STENT INTERVENTION (PCI-S)   2009    [place], [place] - 2 stents in settng of MI -- unknown details   PERIPHERAL VASCULAR BALLOON ANGIOPLASTY Right 02/12/2021    Procedure: PERIPHERAL VASCULAR BALLOON ANGIOPLASTY;  Surgeon: Gretta Lonni PARAS, MD;  [facility]: [facility] INVAS[facility] CV LAB;  Service: Cardiovascular;  Laterality: Right;  UPPER ARM FISTULA   PERIPHERAL VASCULAR BALLOON ANGIOPLASTY   05/20/2022    Procedure: PERIPHERAL VASCULAR BALLOON ANGIOPLASTY;  Surgeon: Gretta Lonni PARAS, MD;  [facility]: [facility] INVAS[facility] CV LAB;  Service: Cardiovascular;;  rt arm fistula site   PERIPHERAL VASCULAR BALLOON ANGIOPLASTY Right 01/28/2022    Procedure: PERIPHERAL VASCULAR BALLOON ANGIOPLASTY;  Surgeon: Gretta Lonni PARAS, MD;  [facility]: [facility] INVAS[facility] CV LAB;  Service: Cardiovascular;  Laterality: Right;  Arm fistula   PERIPHERAL VASCULAR BALLOON ANGIOPLASTY   05/05/2023    Procedure: PERIPHERAL VASCULAR BALLOON ANGIOPLASTY;  Surgeon: Melia Lynwood ORN, MD;  Location: [facility] INVASIVE CV LAB;  Service: Cardiovascular;;  RUA AVF   TRANSTHORACIC ECHOCARDIOGRAM   07/11/2017    a) 12/2015, [place], [place]): Moderate Conc LVH w/ sigmoid septum.  No LVOT obstruction.  Hyperdynamic LV fxn - EF 65 to 70%.  GR 1 DD & high LAP/LVEDP.  AoV Sclerosis - No AS;; b) 06/2017: [facility]): Normal LV Fxn - EF 60%.  Abnormal septal motion.  Mild LVH mild MAC.  Negative bubble study (for CVA)   TRANSTHORACIC ECHOCARDIOGRAM   12/2020    a) 05/2020: EF 60-65%. No RWMA. Gr2DD. Sm posterior pericardial effusion. No AS.; b) 09/2020: EF 70-75%.  Hyperdynamic.  No RWMA. ? D Fxn/LAP.  Mildly elevated PAP.  Pericardial effusion.  Mild ao V sclerosis cyst.; c) 12/2020 (Limited): EF 65-70%. No RWMA. Gr1-2 DD. Mild Conc LVH- high LAP. Nl PAP. Circumferential effusion w/o tamponade. AoV Sclerosis - no AS. Nl RAP.   ULTRASOUND GUIDANCE FOR VASCULAR ACCESS Right 05/28/2022    Procedure: ULTRASOUND GUIDANCE FOR  VASCULAR ACCESS;  Surgeon: Gretta Lonni PARAS, MD;  Location: [facility] OR;  Service: Vascular;  Laterality: Right;   VENOUS ANGIOPLASTY Right 08/16/2023    Procedure: VENOUS ANGIOPLASTY;  Surgeon: Tobie Gordy POUR, MD;  Location: [facility] INVASIVE CV LAB;  Service: Cardiovascular;  Laterality: Right;  70% Innominate; 70% Outflow Basilic        History reviewed. No pertinent family history.     Social History:  reports that she has never smoked. She has never used smokeless tobacco. She reports that she does not currently use alcohol. She reports that she does not use drugs. Allergies:  Allergies       Allergies  Allergen Reactions   Ibuprofen Swelling      Swelling to hands, feet and face     Influenza Vaccines Anaphylaxis and Swelling      fever   Pneumococcal Vaccine Anaphylaxis and Swelling      fever   Promethazine Other (See Comments)      hallucinations   Ambien  Dix.dilling ] Other (See Comments)      Unknown reaction   Codeine Itching   Cyclobenzaprine Other (See Comments)      hallucinations     Hydrocodone -Acetaminophen  Itching      Takes now and has not had many issues.   Tape Itching   Tramadol  Hcl Other (See Comments)      hallucinations   Wound Dressing Adhesive Itching      Use paper tape   Oxycodone  Nausea And Vomiting            Medications Prior to Admission  Medication Sig Dispense Refill   acetaminophen  (TYLENOL ) 500 MG tablet Take 500 mg by mouth every 6 (six) hours as needed for mild pain (pain score 1-3), moderate pain (pain score 4-6), fever or headache.       amLODipine  (NORVASC ) 10 MG tablet Take 1 tablet (10 mg total) by mouth daily. Except on dialysis days do not take a dose. 30 tablet 0   azaTHIOprine  (IMURAN ) 50 MG tablet Take 50 mg by mouth daily.       calcium  acetate (PHOSLO ) 667 MG capsule Take 667 mg by mouth in the morning, at noon, and at bedtime.       carvedilol  (COREG ) 25 MG tablet Take 1 tablet (25 mg total) by mouth 2 (two) times daily with a  meal. And as directed. 60 tablet 0   clopidogrel  (PLAVIX ) 75 MG tablet Take 1 tablet (75 mg total) by mouth daily. 30 tablet 0   diclofenac  Sodium (VOLTAREN ) 1 % GEL Apply 2 g topically 4 (four) times daily. To leg (Patient taking differently: Apply 2 g topically 4 (four) times daily as  needed (pain). To leg) 350 g 0   famotidine  (PEPCID ) 20 MG tablet Take 1 tablet (20 mg total) by mouth daily. 30 tablet 0   hydrALAZINE  (APRESOLINE ) 25 MG tablet Take 1 tablet (25 mg total) by mouth 2 (two) times daily. 180 tablet 3   HYDROcodone -acetaminophen  (NORCO/VICODIN) 5-325 MG tablet Take 1 tablet by mouth every 12 (twelve) hours as needed for moderate pain (pain score 4-6) or severe pain (pain score 7-10).       insulin  degludec (TRESIBA  FLEXTOUCH) 100 UNIT/ML FlexTouch Pen Inject 12 Units into the skin at bedtime. (Patient taking differently: Inject 8-10 Units into the skin at bedtime.) 15 mL 0   insulin  lispro (HUMALOG ) 100 UNIT/ML KwikPen Inject 5 Units into the skin 3 (three) times daily. (Patient taking differently: Inject 4-6 Units into the skin 3 (three) times daily.) 15 mL 0   isosorbide  mononitrate (IMDUR ) 30 MG 24 hr tablet Take 1 tablet (30 mg total) by mouth at bedtime. 90 tablet 3   lidocaine -prilocaine  (EMLA ) cream Apply 1 Application topically daily as needed (port access).       metoCLOPramide  (REGLAN ) 5 MG tablet Take 5 mg by mouth every 6 (six) hours as needed for nausea or vomiting.       naloxone  (NARCAN ) nasal spray 4 mg/0.1 mL Place 0.4 mg into the nose as needed.       nitroGLYCERIN  (NITROSTAT ) 0.4 MG SL tablet Place 1 tablet (0.4 mg total) under the tongue every 5 (five) minutes as needed for chest pain. 30 tablet 12   ondansetron  (ZOFRAN ) 4 MG tablet Take 4 mg by mouth every 8 (eight) hours as needed for nausea or vomiting.       polyethylene glycol (MIRALAX  / GLYCOLAX ) 17 g packet Take 17 g by mouth daily. (Patient taking differently: Take 17 g by mouth daily as needed for moderate  constipation.) 30 each 0   rosuvastatin  (CRESTOR ) 20 MG tablet Take 20 mg by mouth daily.       senna-docusate (SENOKOT-S) 8.6-50 MG tablet Take 1 tablet by mouth 2 (two) times daily as needed for moderate constipation. (Patient taking differently: Take 1 tablet by mouth 2 (two) times daily as needed for mild constipation or moderate constipation.) 60 tablet 0   Continuous Blood Gluc Sensor (FREESTYLE LIBRE 2 SENSOR) MISC USE AS DIRECTED CHANGE EVERY 14 DAYS                Blood pressure (!) 180/73, pulse 87, temperature 98.5 F (36.9 C), temperature source Oral, resp. rate 18, weight 46.3 kg, SpO2 99%. Physical Exam Gen: no distress, normal appearing HEENT: oral mucosa pink and moist, NCAT Cardio: Reg rate Chest: normal effort, normal rate of breathing Abd: soft, non-distended Ext: no edema Psych: pleasant, normal affect Skin: intact Neuro: Alert and oriented. 3/5 strength in RUE and 4/5 in LUE, 3/5 strength in RLE and 4/5 in LLE, sensation is intact, has double vision and decreased peripheral vision   Lab Results Last 24 Hours       Results for orders placed or performed during the hospital encounter of 04/23/24 (from the past 24 hours)  CBG monitoring, ED     Status: Abnormal    Collection Time: 04/24/24  4:47 PM  Result Value Ref Range    Glucose-Capillary 255 (H) 70 - 99 mg/dL  Platelet inhibition [id number] (not at [facility])     Status: None    Collection Time: 04/24/24  6:05 PM  Result Value Ref Range  Platelet Function  P2Y12 229 182 - 335 PRU  Glucose, capillary     Status: Abnormal    Collection Time: 04/24/24  9:08 PM  Result Value Ref Range    Glucose-Capillary 171 (H) 70 - 99 mg/dL    Comment 1 Notify RN      Comment 2 Document in Chart    Glucose, capillary     Status: None    Collection Time: 04/25/24  6:07 AM  Result Value Ref Range    Glucose-Capillary 95 70 - 99 mg/dL    Comment 1 Notify RN      Comment 2 Document in Chart    Glucose, capillary     Status:  Abnormal    Collection Time: 04/25/24 11:17 AM  Result Value Ref Range    Glucose-Capillary 250 (H) 70 - 99 mg/dL      Imaging Results (Last 48 hours)  ECHOCARDIOGRAM COMPLETE Result Date: 04/24/2024    ECHOCARDIOGRAM REPORT   Patient Name:   Hannah [name] Date of Exam: 04/24/2024 Medical Rec #:  [id number]    Height:       62.0 in Accession #:    [id number]   Weight:       102.1 lb Date of Birth:  10-02-1965   BSA:          1.436 m Patient Age:    58 years     BP:           190/74 mmHg Patient Gender: F            HR:           85 bpm. Exam Location:  Inpatient Procedure: 2D Echo (Both Spectral and Color Flow Doppler were utilized during            procedure). Indications:    Stroke  History:        Patient has prior history of Echocardiogram examinations.                 Stroke.  Sonographer:    Norleen Amour Referring Phys: [id number] ROBERT DORRELL IMPRESSIONS  1. Left ventricular ejection fraction, by estimation, is 65 to 70%. The left ventricle has normal function. The left ventricle has no regional wall motion abnormalities. There is mild concentric left ventricular hypertrophy. Left ventricular diastolic parameters were normal.  2. Right ventricular systolic function is normal. The right ventricular size is normal. There is normal pulmonary artery systolic pressure.  3. A small pericardial effusion is present. The pericardial effusion is circumferential. There is no evidence of cardiac tamponade.  4. The mitral valve is normal in structure. No evidence of mitral valve regurgitation. No evidence of mitral stenosis.  5. The aortic valve is normal in structure. Aortic valve regurgitation is mild to moderate. No aortic stenosis is present.  6. Abdominal aorta is normal sized.  7. The inferior vena cava is normal in size with greater than 50% respiratory variability, suggesting right atrial pressure of 3 mmHg. FINDINGS  Left Ventricle: Left ventricular ejection fraction, by estimation, is 65 to 70%. The left  ventricle has normal function. The left ventricle has no regional wall motion abnormalities. The left ventricular internal cavity size was normal in size. There is  mild concentric left ventricular hypertrophy. Left ventricular diastolic parameters were normal. Right Ventricle: The right ventricular size is normal. No increase in right ventricular wall thickness. Right ventricular systolic function is normal. There is normal pulmonary artery systolic pressure. The tricuspid regurgitant velocity is  2.27 m/s, and  with an assumed right atrial pressure of 3 mmHg, the estimated right ventricular systolic pressure is 23.6 mmHg. Left Atrium: Left atrial size was normal in size. Right Atrium: Right atrial size was normal in size. Pericardium: A small pericardial effusion is present. The pericardial effusion is circumferential. There is no evidence of cardiac tamponade. Mitral Valve: The mitral valve is normal in structure. No evidence of mitral valve regurgitation. No evidence of mitral valve stenosis. MV peak gradient, 4.8 mmHg. The mean mitral valve gradient is 3.0 mmHg. Tricuspid Valve: The tricuspid valve is normal in structure. Tricuspid valve regurgitation is not demonstrated. No evidence of tricuspid stenosis. Aortic Valve: The aortic valve is normal in structure. Aortic valve regurgitation is mild to moderate. No aortic stenosis is present. Aortic valve mean gradient measures 3.0 mmHg. Aortic valve peak gradient measures 5.3 mmHg. Aortic valve area, by VTI measures 3.72 cm. Pulmonic Valve: The pulmonic valve was normal in structure. Pulmonic valve regurgitation is not visualized. No evidence of pulmonic stenosis. Aorta: Abdominal aorta is normal sized. The aortic root is normal in size and structure. Venous: The inferior vena cava is normal in size with greater than 50% respiratory variability, suggesting right atrial pressure of 3 mmHg. IAS/Shunts: No atrial level shunt detected by color flow Doppler.  LEFT  VENTRICLE PLAX 2D LVIDd:         3.70 cm     Diastology LVIDs:         1.90 cm     LV e' medial:    6.64 cm/s LV PW:         1.20 cm     LV E/e' medial:  13.4 LV IVS:        1.30 cm     LV e' lateral:   7.62 cm/s LVOT diam:     1.90 cm     LV E/e' lateral: 11.7 LV SV:         70 LV SV Index:   49 LVOT Area:     2.84 cm  LV Volumes (MOD) LV vol d, MOD A2C: 59.4 ml LV vol d, MOD A4C: 67.3 ml LV vol s, MOD A2C: 15.2 ml LV vol s, MOD A4C: 19.2 ml LV SV MOD A2C:     44.2 ml LV SV MOD A4C:     67.3 ml LV SV MOD BP:      47.7 ml RIGHT VENTRICLE             IVC RV Basal diam:  2.40 cm     IVC diam: 1.10 cm RV S prime:     15.70 cm/s TAPSE (M-mode): 2.2 cm      PULMONARY VEINS                             Diastolic Velocity: 51.40 cm/s                             S/D Velocity:       0.90                             Systolic Velocity:  48.20 cm/s LEFT ATRIUM             Index        RIGHT ATRIUM          Index  LA diam:        3.40 cm 2.37 cm/m   RA Area:     6.82 cm LA Vol (A2C):   37.9 ml 26.39 ml/m  RA Volume:   10.50 ml 7.31 ml/m LA Vol (A4C):   43.1 ml 30.01 ml/m LA Biplane Vol: 44.2 ml 30.77 ml/m  AORTIC VALVE                    PULMONIC VALVE AV Area (Vmax):    3.01 cm     PV Vmax:       1.07 m/s AV Area (Vmean):   2.61 cm     PV Peak grad:  4.6 mmHg AV Area (VTI):     3.72 cm AV Vmax:           115.00 cm/s AV Vmean:          85.000 cm/s AV VTI:            0.189 m AV Peak Grad:      5.3 mmHg AV Mean Grad:      3.0 mmHg LVOT Vmax:         122.00 cm/s LVOT Vmean:        78.100 cm/s LVOT VTI:          0.248 m LVOT/AV VTI ratio: 1.31  AORTA Ao Root diam: 2.50 cm Ao Asc diam:  2.70 cm MITRAL VALVE                TRICUSPID VALVE MV Area (PHT): 5.58 cm     TR Peak grad:   20.6 mmHg MV Area VTI:   2.76 cm     TR Vmax:        227.00 cm/s MV Peak grad:  4.8 mmHg MV Mean grad:  3.0 mmHg     SHUNTS MV Vmax:       1.09 m/s     Systemic VTI:  0.25 m MV Vmean:      80.6 cm/s    Systemic Diam: 1.90 cm MV Decel Time: 136 msec  MV E velocity: 89.10 cm/s MV A velocity: 121.00 cm/s MV E/A ratio:  0.74 Kardie Tobb DO Electronically signed by Dub Huntsman DO Signature Date/Time: 04/24/2024/2:38:49 PM    Final     CT ANGIO HEAD NECK W WO CM Result Date: 04/24/2024 CLINICAL DATA:  Stroke/TIA EXAM: CT ANGIOGRAPHY HEAD AND NECK WITH AND WITHOUT CONTRAST TECHNIQUE: Multidetector CT imaging of the head and neck was performed using the standard protocol during bolus administration of intravenous contrast. Multiplanar CT image reconstructions and MIPs were obtained to evaluate the vascular anatomy. Carotid stenosis measurements (when applicable) are obtained utilizing NASCET criteria, using the distal internal carotid diameter as the denominator. RADIATION DOSE REDUCTION: This exam was performed according to the departmental dose-optimization program which includes automated exposure control, adjustment of the mA and/or kV according to patient size and/or use of iterative reconstruction technique. CONTRAST:  75mL OMNIPAQUE  IOHEXOL  350 MG/ML SOLN COMPARISON:  Brain MRI April 23, 2024 FINDINGS: CT HEAD: There is low-attenuation in the cerebral white matter There is no hemorrhage. No acute ischemic changes. No mass lesion. The ventricles are normal. Skull/sinuses/orbits: No significant abnormality. CTA NECK: CTA NECK Aortic arch: No proximal vessel stenosis. Right carotid: There is a small amount of plaque with less than 50% stenosis Left carotid: Small amount of plaque with less than 50% stenosis Right vertebral: Normal Left vertebral: Normal Soft tissues: No significant abnormality Other comments: None  CTA HEAD: CTA HEAD Right anterior circulation: The internal carotid artery is patent without significant stenosis. The anterior and middle cerebral arteries are patent without significant stenosis or proximal branch occlusion. No aneurysm. Left anterior circulation: The internal carotid artery is patent without significant stenosis. The anterior and  middle cerebral arteries are patent without significant stenosis or proximal branch occlusion. No aneurysm. Posterior circulation: Both vertebral arteries are patent intracranially. No basilar stenosis. There is mild stenosis of the P1 segment of the right posterior cerebral artery. IMPRESSION: 1. Bilateral carotid plaque with less than 50% stenosis 2. No significant vertebrobasilar or intracranial disease 3. Chronic white matter abnormalities. No acute abnormality noted on the CT. Electronically Signed   By: Nancyann Burns M.D.   On: 04/24/2024 09:43    MR BRAIN WO CONTRAST Result Date: 04/23/2024 EXAM: MRI BRAIN WITHOUT CONTRAST 04/23/2024 05:42:47 PM TECHNIQUE: Multiplanar multisequence MRI of the head/brain was performed without the administration of intravenous contrast. COMPARISON: Same day CT head and MRI head 09/23/2023. CLINICAL HISTORY: Vision loss, known etiology. FINDINGS: BRAIN AND VENTRICLES: There is a 4 mm focus of acute infarct within the right dorsal aspect of the midbrain partially involving the periaqueductal gray matter seen on series 2 image 22. No significant associated edema or mass effect. Remote lacunar infarcts are present in the bilateral basal ganglia and thalami, left greater than right, and in the right corona radiata. Volume loss in the left cerebral peduncle is likely related to prior infarcts. T2 and FLAIR hyperintensity in the periventricular and subcortical white matter with additional signal abnormality in the pons is suggestive of moderate chronic microvascular ischemic changes. Multiple scattered chronic microhemorrhages are present, particularly within the thalami and brainstem, likely related to hypertensive encephalopathy. Few additional smaller chronic microhemorrhages are seen in the cerebral hemispheres. There is mild generalized parenchymal volume loss. No mass. No midline shift. No hydrocephalus. The sella is unremarkable. Normal flow voids. ORBITS: Bilateral lens  replacement. SINUSES AND MASTOIDS: Mucosal thickening in the left sphenoid sinus. BONES AND SOFT TISSUES: Normal marrow signal. Degenerative changes in the visualized upper cervical spine. Prominent signal abnormality at C4-C5 likely reflecting chronic degenerative endplate changes. No acute soft tissue abnormality. IMPRESSION: 1. Acute infarct in the right dorsal midbrain measuring 4 mm, partially involving the periaqueductal gray matter. No significant edema or mass effect. 2. Multiple scattered chronic microhemorrhages, particularly within the thalami and brainstem, likely related to hypertensive encephalopathy. 3. Moderate chronic microvascular ischemic changes, mild generalized parenchymal volume loss, remote lacunar infarcts as above. Electronically signed by: Donnice Mania MD 04/23/2024 06:01 PM EST RP Workstation: [id number]    CT HEAD WO CONTRAST Result Date: 04/23/2024 EXAM: CT HEAD WITHOUT CONTRAST 04/23/2024 02:19:26 PM TECHNIQUE: CT of the head was performed without the administration of intravenous contrast. Automated exposure control, iterative reconstruction, and/or weight based adjustment of the mA/kV was utilized to reduce the radiation dose to as low as reasonably achievable. COMPARISON: 09/23/2023 CLINICAL HISTORY: Neuro deficit, acute, stroke suspected. FINDINGS: BRAIN AND VENTRICLES: No acute hemorrhage. No evidence of acute infarct. Patchy and confluent decreased attenuation throughout deep and periventricular white matter of cerebral hemispheres bilaterally, compatible with chronic microvascular ischemic disease. Chronic right corona radiata, left basal ganglia lacunar infarcts. No hydrocephalus. No extra-axial collection. No mass effect or midline shift. ORBITS: Bilateral lens replacement. SINUSES: Left sphenoid sinus mucosal thickening. SOFT TISSUES AND SKULL: No acute soft tissue abnormality. No skull fracture. VASCULATURE: Atherosclerotic calcifications within cavernous internal carotid  arteries. IMPRESSION: 1. No acute intracranial abnormality. 2.  Moderate chronic microvascular ischemic disease. Chronic right corona radiata and left basal ganglia lacunar infarcts. Electronically signed by: Donnice Mania MD 04/23/2024 02:41 PM EST RP Workstation: [id number]        Assessment/Plan: Diagnosis: Acute R midbrain infarct Does the need for close, 24 hr/day medical supervision in concert with the patient's rehab needs make it unreasonable for this patient to be served in a less intensive setting? Yes Co-Morbidities requiring supervision/potential complications:  1) Diabetes: continue insulin  2) HTN: plan to normalize BM in 1-2 days 3) HLD: continue Crestor  4) Leukopenia: continue to monitor WBC 5) ESRD: continue to monitor creatinine Due to bladder management, bowel management, safety, skin/wound care, disease management, medication administration, pain management, and patient education, does the patient require 24 hr/day rehab nursing? Yes Does the patient require coordinated care of a physician, rehab nurse, therapy disciplines of PT, OT, SLP to address physical and functional deficits in the context of the above medical diagnosis(es)? Yes Addressing deficits in the following areas: balance, endurance, locomotion, strength, transferring, bowel/bladder control, bathing, dressing, feeding, grooming, toileting, speech, and psychosocial support Can the patient actively participate in an intensive therapy program of at least 3 hrs of therapy per day at least 5 days per week? Yes The potential for patient to make measurable gains while on inpatient rehab is excellent Anticipated functional outcomes upon discharge from inpatient rehab are modified independent  with PT, modified independent with OT, modified independent with SLP. Estimated rehab length of stay to reach the above functional goals is: 10-14 days Anticipated discharge destination: Home Overall Rehab/Functional Prognosis:  excellent   POST ACUTE RECOMMENDATIONS: This patient's condition is appropriate for continued rehabilitative care in the following setting: CIR Patient has agreed to participate in recommended program. Yes Note that insurance prior authorization may be required for reimbursement for recommended care.     I have personally performed a face to face diagnostic evaluation of this patient. Additionally, I have examined the patient's medical record including any pertinent labs and radiographic images.     Thanks,   Sven SHAUNNA Elks, MD 04/25/2024

## 2024-04-30 NOTE — Progress Notes (Signed)
 Patient who was noted to be bleeding from her right upper arm AVG fistula.There  was noted a significant soiling to her linen, patient cannot tell when the bleeding started. Pressure dressing applied to area with 4 by 4s and abd packs. Bleeding noted to have stopped with intervention, rapid response nurse called, and recommended to keep checking patient vitals closely, monitor for any further episode of bleeding and notify the on-call provider. Patient vitals taken and are stable, patient denies any shortness of breath or dizziness. Patient care to continue per provider recommendations.

## 2024-04-30 NOTE — Progress Notes (Signed)
 Nurse called, reporting Hannah [name] complained of chest pain, EKG was performed. He reports Ms Marmo stated  she doesnt have any more chest pain at this time. Stat Trop was ordered, nurse was asked to obtain vitals, he verbalizes understanding.

## 2024-04-30 NOTE — Progress Notes (Signed)
 Received a call from charge nurse reporting  Hannah [name] was bleeding from her fistula site this evening. They had called the rapid nurse, he reports she had blood loss, vitals are stable. CBC ordered stat, he verbalizes understanding.

## 2024-04-30 NOTE — Progress Notes (Signed)
 Spoke to nurse and charge nurse, they report the patient is not actively bleeding at this time and she had dialysis today. We will continue to monitor, charge nurse verbalizes understanding.

## 2024-04-30 NOTE — Procedures (Signed)
 HD Note:  Some information was entered later than the data was gathered due to patient care needs. The stated time with the data is accurate.  Received patient in bed to unit.   Alert and oriented.   Informed consent signed and in chart.   Access used: upper right arm graft Access issues: None  Patient handed off to the Dialysis Charge nurse.  Ramadan Couey L. Lenon, RN [facility] D[facility].

## 2024-04-30 NOTE — Progress Notes (Signed)
 [facility] Progress Note   Subjective:    Seen and examined patient on HD. Tolerating UFG 2L. Denies any acute issues.  Objective Vitals:   04/30/24 0930 04/30/24 1000 04/30/24 1030 04/30/24 1100  BP: (!) 145/67 (!) 157/68 (!) 157/67 (!) 160/68  Pulse: 81 82 83 83  Resp: 11 13 11 16   Temp:      TempSrc:      SpO2: 99% 99% 99% 99%  Weight:       Physical Exam General: alert female in NAD Heart: RRR, no murmur Lungs: CTA bilaterally, respirations unlabored Abdomen: Soft, non-distended Extremities: no edema b/l lower extremities Dialysis Access:  RUE AVG + t/b, no bleeding  Filed Weights   04/25/24 2315 04/26/24 0230 04/30/24 0845  Weight: 46.3 kg 44.3 kg 46.7 kg    Intake/Output Summary (Last 24 hours) at 04/30/2024 1139 Last data filed at 04/29/2024 2000 Gross per 24 hour  Intake 120 ml  Output --  Net 120 ml    Additional Objective Labs: Basic Metabolic Panel: Recent Labs  Lab 04/25/24 2349 04/27/24 0232 04/29/24 0634 04/30/24 0338  NA 136 133* 134* 133*  K 4.8 4.8 4.5 5.0  CL 98 97* 96* 95*  CO2 29 31 29 30   GLUCOSE 141* 242* 158* 174*  BUN 33* 25* 30* 38*  CREATININE 6.93* 5.95* 6.09* 7.91*  CALCIUM  8.6* 8.5* 9.4 9.0  PHOS 5.7* 4.5  --  4.7*   Liver Function Tests: Recent Labs  Lab 04/23/24 1344 04/25/24 2349 04/30/24 0338  AST 15  --  13*  ALT 8  --  8  ALKPHOS 48  --  38  BILITOT 0.9  --  1.0  PROT 7.2  --  6.4*  ALBUMIN 3.3* 3.0* 3.0*   No results for input(s): LIPASE, AMYLASE in the last 168 hours. CBC: Recent Labs  Lab 04/23/24 1344 04/25/24 2349 04/27/24 0232 04/29/24 0634 04/30/24 0338  WBC 3.0* 4.0 4.5 4.8 5.3  NEUTROABS 1.6*  --   --   --  3.6  HGB 11.8* 10.3* 10.0* 9.9* 9.8*  HCT 37.8 32.3* 31.0* 30.6* 31.1*  MCV 96.7 96.7 95.1 95.6 96.9  PLT 163 173 176 182 191   Blood Culture No results found for: SDES, SPECREQUEST, CULT, REPTSTATUS  Cardiac Enzymes: No results for input(s):  CKTOTAL, CKMB, CKMBINDEX, TROPONINI in the last 168 hours. CBG: Recent Labs  Lab 04/29/24 0623 04/29/24 1150 04/29/24 1605 04/29/24 2103 04/30/24 0628  GLUCAP 176* 227* 106* 240* 159*   Iron Studies: No results for input(s): IRON, TIBC, TRANSFERRIN, FERRITIN in the last 72 hours. Lab Results  Component Value Date   INR 1.1 04/23/2024   INR 1.1 09/24/2023   INR 1.1 09/23/2023   Studies/Results: No results found.  Medications:   amLODipine   10 mg Oral Daily   aspirin  EC  81 mg Oral Daily   azaTHIOprine   50 mg Oral Daily   calcium  acetate  667 mg Oral TID WC   carvedilol   25 mg Oral BID WC   Chlorhexidine  Gluconate Cloth  6 each Topical Q0600   Chlorhexidine  Gluconate Cloth  6 each Topical Q0600   heparin  injection (subcutaneous)  5,000 Units Subcutaneous Q8H   hydrALAZINE   50 mg Oral TID   insulin  aspart  0-5 Units Subcutaneous QHS   insulin  aspart  0-9 Units Subcutaneous TID WC   insulin  glargine  10 Units Subcutaneous Daily   isosorbide  mononitrate  30 mg Oral QHS   polyethylene glycol  17  g Oral BID   rosuvastatin   20 mg Oral Daily   ticagrelor   90 mg Oral BID    Dialysis Orders: AF MWF 3:30 BFR 350 2K/2Ca AVG 16g No heparin   -No ESA  -Hectorol  1 mcg q HD  Assessment/Plan: Acute CVA. H/o prior strokes. 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 Management per neuro. Being evaluated for CIR.  ESRD.  HD MWF. Continue per schedule. On HD.  Prolonged bleeding from AVG previously. Getting sub Q heparin  here. Did not have any prolonged bleeding on 04/27/24.  Hypertension. Home meds resumed. BP now at goal.  Volume. No evidence of volume overload on exam. Continue UF with HD as able.  Anemia. 9.9, follow and will start ESA if remains <10 Metabolic bone disease: calcium  and phosphorus controlled. Continue Hectorol , Phoslo  binder  T2DM. Insulin  per primary   Charmaine Piety, NP [facility] 04/30/2024,11:39 AM  LOS: 7 days

## 2024-04-30 NOTE — Progress Notes (Signed)
 Inpatient Rehabilitation Admission Medication Review by a Pharmacist  A complete drug regimen review was completed for this patient to identify any potential clinically significant medication issues.  High Risk Drug Classes Is patient taking? Indication by Medication  Antipsychotic No   Anticoagulant Yes SQ heparin  - VTE prophylaxis  Antibiotic No   Opioid Yes PRN Norco - moderate or severe pain  Antiplatelet Yes Ticagrelor  (begun 04/25/24) planned for 30 days, then Aspirin  81 mg daily alone indefinitely - CVA prophylaxis  Hypoglycemics/insulin  Yes Insulin  glargine, SSI - DM2  Vasoactive Medication Yes Amlodipine , carvedilol , hydralazine , isosorbide  mononitrate - hypertension  Chemotherapy No   Other Yes Azathioprine  - chorioretinitis Calcium  Acetate - phosphate binder Famotidine  - GERD Rosuvastatin  - hyperlipidemia Miralax , sorbitol  - laxatives  PRNs: Acetaminophen  - mild pain Diphenhydramine  - itching Guaifenesin -dextromethorphan - cough Melatonin - sleep Naloxone  - opioid reversal Ondansetron  - nausea, vomiting Simethicone  - flatulence Sorbitol , milk and molasses enema - constipation     Type of Medication Issue Identified Description of Issue Recommendation(s)  Drug Interaction(s) (clinically significant)     Duplicate Therapy     Allergy     No Medication Administration End Date  Ticagrelor  planned for 30 days, then Aspirin  81 mg daily alone indefinitely. Ticagrelor  begun 04/25/24. Anticipate Ticagrelor  stop date 05/24/24.  Incorrect Dose     Additional Drug Therapy Needed     Significant med changes from prior encounter (inform family/care partners about these prior to discharge). New Aspirin  81 mg. Clopidogrel  changed to Ticagrelor .  Hydralazine  dose increased.  PRN metoclopramide  to PRN ondansetron .  Communicate changes with patient/family prior to discharge.  Other  Insulin  glargine for PTA Tresiba .  Off PRN diclofenac  gel.  Back to Tresiba  at  discharge.  Resume if clinically warranted during CIR admit or at discharge.     Clinically significant medication issues were identified that warrant physician communication and completion of prescribed/recommended actions by midnight of the next day:  No  Name of provider notified for urgent issues identified: n/a  Provider Method of Notification: n/a  Pharmacist comments:  - Ticagrelor  planned for 30 days, then Aspirin  81 mg daily alone indefinitely. - begun 04/25/24 > stop date 05/24/24  Time spent performing this drug regimen review (minutes):  20   Hannah [name], [place] 04/30/2024 5:44 PM

## 2024-04-30 NOTE — Progress Notes (Signed)
 H&P note was reviewed, Hannah [name] has been having prolong bleeding from AVG, will check PT/PTT/INR. Last PT/PTT/INR was a week ago. Spoke with charge nurse regarding the above, he verbalizes understanding.

## 2024-04-30 NOTE — Progress Notes (Signed)
 Inpatient Rehab Admissions Coordinator:    I have a CIR bed for this Pt. Today once HD is completed.   Pt. To admit to CIR for estimated 10-14 days with the goal of d/c home with her daughter and brother rotating to provide 24/7 support.   Leita Kleine, MS, CCC-SLP Rehab Admissions Coordinator  [phone] (celll) [phone] (office)

## 2024-04-30 NOTE — Progress Notes (Signed)
 Patient who called complaining of substernal chest pain. Patient states pain is dull and has been ongoing for over a week.

## 2024-05-01 LAB — PROTIME-INR
INR: 1.1 (ref 0.8–1.2)
Prothrombin Time: 15.3 s — ABNORMAL HIGH (ref 11.4–15.2)

## 2024-05-01 LAB — GLUCOSE, CAPILLARY
Glucose-Capillary: 191 mg/dL — ABNORMAL HIGH (ref 70–99)
Glucose-Capillary: 88 mg/dL (ref 70–99)
Glucose-Capillary: 176 mg/dL — ABNORMAL HIGH (ref 70–99)
Glucose-Capillary: 190 mg/dL — ABNORMAL HIGH (ref 70–99)

## 2024-05-01 LAB — TROPONIN I (HIGH SENSITIVITY)
Troponin I (High Sensitivity): 117 ng/L (ref <18)
Troponin I (High Sensitivity): 119 ng/L (ref <18)

## 2024-05-01 LAB — APTT: aPTT: 148 s — ABNORMAL HIGH (ref 24–36)

## 2024-05-01 MED ORDER — AMLODIPINE BESYLATE 5 MG PO TABS
5.0000 mg | ORAL_TABLET | Freq: Every day | ORAL | Status: DC
  Filled 2024-05-01: qty 1

## 2024-05-01 MED ORDER — HYDROCODONE-ACETAMINOPHEN 5-325 MG PO TABS
1.0000 | ORAL_TABLET | Freq: Two times a day (BID) | ORAL | Status: DC | PRN
  Administered 2024-05-01: 12:00:00 1 via ORAL
  Filled 2024-05-01: qty 1

## 2024-05-01 MED ORDER — INSULIN GLARGINE 100 UNIT/ML ~~LOC~~ SOLN
11.0000 [IU] | Freq: Every day | SUBCUTANEOUS | Status: DC
  Administered 2024-05-01: 23:00:00 10 [IU] via SUBCUTANEOUS
  Administered 2024-05-02: 22:00:00 11 [IU] via SUBCUTANEOUS
  Filled 2024-05-01 (×3): qty 0.11

## 2024-05-01 MED ORDER — CHLORHEXIDINE GLUCONATE CLOTH 2 % EX PADS
6.0000 | MEDICATED_PAD | Freq: Every day | CUTANEOUS | Status: DC
  Administered 2024-05-02 – 2024-05-03 (×2): 6 via TOPICAL

## 2024-05-01 MED ORDER — VITAMIN D 25 MCG (1000 UNIT) PO TABS
1000.0000 [IU] | ORAL_TABLET | Freq: Every day | ORAL | Status: DC
  Administered 2024-05-01 – 2024-05-08 (×8): 1000 [IU] via ORAL
  Filled 2024-05-01 (×8): qty 1

## 2024-05-01 MED ORDER — L-METHYLFOLATE-B6-B12 3-35-2 MG PO TABS
1.0000 | ORAL_TABLET | Freq: Every day | ORAL | Status: DC
  Administered 2024-05-01 – 2024-05-13 (×12): 1 via ORAL
  Filled 2024-05-01 (×13): qty 1

## 2024-05-01 MED ORDER — B COMPLEX-C PO TABS
1.0000 | ORAL_TABLET | Freq: Every day | ORAL | Status: DC
  Administered 2024-05-01 – 2024-05-13 (×12): 1 via ORAL
  Filled 2024-05-01 (×13): qty 1

## 2024-05-01 NOTE — Evaluation (Signed)
 Physical Therapy Assessment and Plan  Patient Details  Name: Hannah [name] MRN: [id number] Date of Birth: 03-Jun-1965  PT Diagnosis: Abnormal posture, Abnormality of gait, Cognitive deficits, Coordination disorder, Difficulty walking, Dizziness and giddiness, Hemiparesis dominant, Impaired cognition, and Muscle weakness Rehab Potential: Fair ELOS: 12-14 days   Today's Date: 05/01/2024 PT Individual Time: 1250-1400 PT Individual Time Calculation (min): 70 min    Hospital Problem: Principal Problem:   Small vessel stroke [facility])   Past Medical History:  Past Medical History:  Diagnosis Date   CAD S/P PCI x 2 2009   MI 2009 -> PCI x 2 ([place]); 01/2016 Nuc ST Nonischemic with Normal EF   Cerebral infarction (HCC) 07/12/2017   2009 first one, has had a total of 4 stokes- last 2021   Chronic kidney disease    Hemo M-W- F   Diabetes type 2, controlled (HCC)    Hemiparesis affecting right side as late effect of cerebrovascular accident (CVA) (HCC)    Hyperlipidemia    Hypertension    Intractable vomiting with nausea 07/12/2017   Multiple sclerosis 2009   Myocardial infarction [facility]) 2009   Stroke [facility])    Past Surgical History:  Past Surgical History:  Procedure Laterality Date   A/V FISTULAGRAM Right 02/12/2021   Procedure: A/V FISTULAGRAM;  Surgeon: Gretta Lonni PARAS, MD;  [facility]: [facility] INVASIVE CV LAB;  Service: Cardiovascular;  Laterality: Right;   A/V FISTULAGRAM Right 05/20/2022   Procedure: A/V Fistulagram;  Surgeon: Gretta Lonni PARAS, MD;  Location: [facility] INVASIVE CV LAB;  Service: Cardiovascular;  Laterality: Right;   A/V FISTULAGRAM N/A 05/05/2023   Procedure: A/V Fistulagram;  Surgeon: Melia Lynwood ORN, MD;  Location: [facility] INVASIVE CV LAB;  Service: Cardiovascular;  Laterality: N/A;   A/V SHUNT INTERVENTION N/A 08/16/2023   Procedure: A/V SHUNT INTERVENTION;  Surgeon: Tobie Gordy POUR, MD;  Location: [facility] INVASIVE CV LAB;  Service: Cardiovascular;  Laterality: N/A;   A/V  SHUNTOGRAM Right 09/26/2023   Procedure: A/V SHUNTOGRAM;  Surgeon: Pearline Norman RAMAN, MD;  Location: [facility] INVASIVE CV LAB;  Service: Cardiovascular;  Laterality: Right;   APPENDECTOMY     AV FISTULA PLACEMENT Right 10/02/2020   Procedure: RIGHT ARTERIOVENOUS (AV) FISTULA CREATION;  Surgeon: Sheree Penne Lonni, MD;  Location: [facility] OR;  Service: Vascular;  Laterality: Right;   AV FISTULA PLACEMENT Right 05/28/2022   Procedure: RIGHT ARM ARTERIOVENOUS (AV) FISTULA REVISION WITH GORETEX GRAFT;  Surgeon: Gretta Lonni PARAS, MD;  Location: [facility] OR;  Service: Vascular;  Laterality: Right;   BASCILIC VEIN TRANSPOSITION Right 11/24/2020   Procedure: SECOND STAGE RIGHT ARM BASCILIC VEIN TRANSPOSITION;  Surgeon: Gretta Lonni PARAS, MD;  Location: [facility] OR;  Service: Vascular;  Laterality: Right;   CYSTOSTOMY W/ BLADDER BIOPSY     INSERTION OF DIALYSIS CATHETER Right 05/28/2022   Procedure: INSERTION OF TUNNELED DIALYSIS CATHETER IN THE RIGHT INTERNAL JUGULAR;  Surgeon: Gretta Lonni PARAS, MD;  Location: [facility] OR;  Service: Vascular;  Laterality: Right;   IR FLUORO GUIDE CV LINE RIGHT  09/29/2020   IR US  GUIDE VASC ACCESS RIGHT  09/29/2020   NM MYOVIEW  LTD  12/25/2015   [place], [place] - NORMAL /NEGATIVE for ischemia. Normal LV Fxn   OVARIAN CYST SURGERY     PERCUTANEOUS CORONARY STENT INTERVENTION (PCI-S)  2009   [place], [place] - 2 stents in settng of MI -- unknown details   PERIPHERAL VASCULAR BALLOON ANGIOPLASTY Right 02/12/2021   Procedure: PERIPHERAL VASCULAR BALLOON ANGIOPLASTY;  Surgeon: Gretta Lonni PARAS, MD;  [facility] IN[facility];  Service: Cardiovascular;  Laterality: Right;  UPPER ARM FISTULA   PERIPHERAL VASCULAR BALLOON ANGIOPLASTY  05/20/2022   Procedure: PERIPHERAL VASCULAR BALLOON ANGIOPLASTY;  Surgeon: Gretta Lonni PARAS, MD;  [facility] INV[facility] LAB;  Service: Cardiovascular;;  rt arm fistula site   PERIPHERAL VASCULAR BALLOON ANGIOPLASTY Right 01/28/2022   Procedure:  PERIPHERAL VASCULAR BALLOON ANGIOPLASTY;  Surgeon: Gretta Lonni PARAS, MD;  [facility];  Service: Cardiovascular;  Laterality: Right;  Arm fistula   PERIPHERAL VASCULAR BALLOON ANGIOPLASTY  05/05/2023   Procedure: PERIPHERAL VASCULAR BALLOON ANGIOPLASTY;  Surgeon: Melia Lynwood ORN, MD;  [facility]: [facility] INVASIVE CV LAB;  Service: Cardiovascular;;  RUA AVF   TRANSTHORACIC ECHOCARDIOGRAM  07/11/2017   a) 12/2015, [place], [place]): Moderate Conc LVH w/ sigmoid septum.  No LVOT obstruction.  Hyperdynamic LV fxn - EF 65 to 70%.  GR 1 DD & high LAP/LVEDP.  AoV Sclerosis - No AS;; b) 06/2017: [facility]): Normal LV Fxn - EF 60%.  Abnormal septal motion.  Mild LVH mild MAC.  Negative bubble study (for CVA)   TRANSTHORACIC ECHOCARDIOGRAM  12/2020   a) 05/2020: EF 60-65%. No RWMA. Gr2DD. Sm posterior pericardial effusion. No AS.; b) 09/2020: EF 70-75%.  Hyperdynamic.  No RWMA. ? D Fxn/LAP.  Mildly elevated PAP.  Pericardial effusion.  Mild ao V sclerosis cyst.; c) 12/2020 (Limited): EF 65-70%. No RWMA. Gr1-2 DD. Mild Conc LVH- high LAP. Nl PAP. Circumferential effusion w/o tamponade. AoV Sclerosis - no AS. Nl RAP.   ULTRASOUND GUIDANCE FOR VASCULAR ACCESS Right 05/28/2022   Procedure: ULTRASOUND GUIDANCE FOR VASCULAR ACCESS;  Surgeon: Gretta Lonni PARAS, MD;  Location: [facility] OR;  Service: Vascular;  Laterality: Right;   VENOUS ANGIOPLASTY Right 08/16/2023   Procedure: VENOUS ANGIOPLASTY;  Surgeon: Tobie Gordy POUR, MD;  Location: [facility] INVASIVE CV LAB;  Service: Cardiovascular;  Laterality: Right;  70% Innominate; 70% Outflow Basilic    Assessment & Plan Clinical Impression: Patient is a 58 y.o. year old female withhistory of CVA with chronic right sided weakness, CAD s/p PCI, HTN, MS, chorioretinitis, chronic LBP, T2DM- HD MWF wjp was admitted on 04/23/24 with onset peripheral vision loss, left facial weakness with left sided weakness and aphasia. MRI brain done revealing acute infarct right  dorsal midbrain and multiple scattered chronic microhemorrhages particularly within the thalami and brain stem. CTA  head/neck negative for  intracranial disease or stenosis.  2 D echo showed EF 65-70% with no wall abnormalities and small pericardial effusion. Dr. Jerri felt that stroke was due to small vessel disease and Plavix  changed to Brillinta and ASA added with recommendations to continue for 30 days then change to ASA 81 mg indefinitely.  P2Y12 level 229 (normal).   HD on going MWF and she has had some issues with prolonged bleeding from AVG site. Anemia being monitored and BP control improving. PT/OT/ST has been working with patient who requires mod to max assist with ADLs with cues to compensated for left lean, dysarthria is at baseline and requires mod assist for mobility and able to take a couple of steps. At baseline she was independent at wheelchair level and was able to walk a few steps with use of walker. CIR recommended due to functional decline.    Patient currently requires mod with mobility secondary to muscle weakness and muscle joint tightness, decreased cardiorespiratoy endurance, impaired timing and sequencing, abnormal tone, unbalanced muscle activation, decreased coordination, and decreased motor planning, decreased visual acuity and decreased  visual perceptual skills, decreased motor planning, decreased initiation, decreased attention, decreased awareness, decreased problem solving, decreased safety awareness, and delayed processing, and decreased sitting balance, decreased standing balance, decreased postural control, hemiplegia, and decreased balance strategies.  Prior to hospitalization, patient was CGA/min with mobility and lived with Daughter, Son in a Apartment home.  Home access is thresholdStairs to enter.  Patient will benefit from skilled PT intervention to maximize safe functional mobility, minimize fall risk, and decrease caregiver burden for planned discharge home with 24  hour assist.  Anticipate patient will benefit from follow up [facility] at discharge.  PT - End of Session Activity Tolerance: Tolerates 30+ min activity with multiple rests Endurance Deficit: Yes Endurance Deficit Description: required frequent rest breaks and easily fatigued with minimal activity. PT Assessment Rehab Potential (ACUTE/IP ONLY): Fair PT Barriers to Discharge: Home environment access/layout;Hemodialysis;Weight PT Barriers to Discharge Comments: WC will not fit into bathroom, required assist at baseline, poor nutritional intake, HD M/W/F PT Patient demonstrates impairments in the following area(s): Balance;Edema;Endurance;Motor;Nutrition;Pain;Perception;Safety;Sensory;Skin Integrity PT Transfers Functional Problem(s): Bed Mobility;Car;Furniture;Bed to Chair PT Locomotion Functional Problem(s): Ambulation;Wheelchair Mobility;Stairs PT Plan PT Intensity: Minimum of 1-2 x/day ,45 to 90 minutes PT Frequency: 5 out of 7 days PT Duration Estimated Length of Stay: 12-14 days PT Treatment/Interventions: Ambulation/gait training;DME/adaptive equipment instruction;Psychosocial support;UE/LE Strength taining/ROM;Balance/vestibular training;Functional electrical stimulation;Skin care/wound management;UE/LE Coordination activities;Cognitive remediation/compensation;Functional mobility training;Splinting/orthotics;Visual/perceptual remediation/compensation;Community reintegration;Neuromuscular re-education;Wheelchair propulsion/positioning;Discharge planning;Pain management;Therapeutic Activities;Disease management/prevention;Patient/family education;Therapeutic Exercise;Stair training PT Transfers Anticipated Outcome(s): CGA with LRAD PT Locomotion Anticipated Outcome(s): min A with LRAD PT Recommendation Recommendations for Other Services: Speech consult Follow Up Recommendations: Home health PT Patient destination: Home Equipment Recommended: To be determined Equipment Details: has WC and  RW   PT Evaluation Precautions/Restrictions Precautions Precautions: Fall Precaution/Restrictions Comments: R hemiparesis Restrictions Weight Bearing Restrictions Per Provider Order: No Pain Interference Pain Interference Pain Effect on Sleep: 2. Occasionally Pain Interference with Therapy Activities: 1. Rarely or not at all Pain Interference with Day-to-Day Activities: 1. Rarely or not at all Home Living/Prior Functioning Home Living Available Help at Discharge: Family;Available 24 hours/day Type of Home: Apartment Home Access: Stairs to enter Entrance Stairs-Number of Steps: threshold Entrance Stairs-Rails: None Home Layout: One level Bathroom Shower/Tub: [employer] Systems: Standard Bathroom Accessibility: Yes Additional Comments: son and daughter were providing CGA/min A with transfers and ADLs. Pt mostly WC bound, only walking 79ft to get into bathroom.  Lives With: Daughter;Son Prior Function Level of Independence: Needs assistance with ADLs;Needs assistance with tranfers;Needs assistance with homemaking;Requires assistive device for independence  Able to Take Stairs?: No Driving: No Vocation: Unemployed Vision/Perception  Vision - History Ability to See in Adequate Light: 1 Impaired Vision - Assessment Additional Comments: Pt reports decreased peripheral vision, visual deficits consistent in functional skill performance Perception Perception: Within Functional Limits Praxis Praxis: Impaired  Cognition Overall Cognitive Status: Impaired/Different from baseline Arousal/Alertness: Awake/alert Orientation Level: Oriented X4 Year: 2025 Month: December Day of Week: Correct Attention: Sustained Sustained Attention: Appears intact Memory: Impaired Memory Impairment: Retrieval deficit;Storage deficit Awareness: Impaired Awareness Impairment: Emergent impairment;Anticipatory impairment Problem Solving: Impaired Problem Solving Impairment: Verbal  basic;Functional basic Executive Function: Sequencing;Organizing Sequencing: Impaired Sequencing Impairment: Verbal basic;Functional basic Organizing: Impaired Organizing Impairment: Verbal basic;Functional basic Safety/Judgment: Impaired Sensation Sensation Light Touch: Appears Intact Hot/Cold: Not tested Proprioception: Impaired by gross assessment Stereognosis: Not tested Coordination Gross Motor Movements are Fluid and Coordinated: No Fine Motor Movements are Fluid and Coordinated: No Coordination and Movement Description: grossly uncoordinated due to R hemi, previous CVA with L  hemi, global weakness/deconditioning, decreased balance/coordination, and poor activity tolerance Finger Nose Finger Test: LUE ataxic, RUE unable Motor  Motor Motor: Hemiplegia Motor - Skilled Clinical Observations: R hemi, previous CVA with L hemi, global weakness/deconditioning, decreased balance/coordination, and poor activity tolerance  Trunk/Postural Assessment  Cervical Assessment Cervical Assessment: Exceptions to [facility] (forward head) Thoracic Assessment Thoracic Assessment: Exceptions to [facility] (kyphosis) Lumbar Assessment Lumbar Assessment: Exceptions to [facility] (posterior pelvic tilt) Postural Control Postural Control: Deficits on evaluation Trunk Control: posterior bias Righting Reactions: delayed and inadequate Protective Responses: delayed and inadequate  Balance Balance Balance Assessed: Yes Static Sitting Balance Static Sitting - Balance Support: Left upper extremity supported;Feet supported Static Sitting - Level of Assistance: 5: Stand by assistance (CGA) Dynamic Sitting Balance Dynamic Sitting - Balance Support: Feet supported;No upper extremity supported Dynamic Sitting - Level of Assistance: 4: Min assist Dynamic Sitting - Balance Activities: Lateral lean/weight shifting;Forward lean/weight shifting Static Standing Balance Static Standing - Balance Support: Bilateral upper extremity  supported (RW) Static Standing - Level of Assistance: 3: Mod assist Dynamic Standing Balance Dynamic Standing - Balance Support: Bilateral upper extremity supported Dynamic Standing - Level of Assistance: 2: Max assist Dynamic Standing - Balance Activities: Lateral lean/weight shifting;Forward lean/weight shifting Extremity Assessment  RLE Assessment RLE Assessment: Exceptions to [facility] General Strength Comments: tested sitting in WC RLE Strength Right Hip Flexion: 3+/5 Right Hip ABduction: 3+/5 Right Hip ADduction: 3+/5 Right Knee Flexion: 3+/5 Right Knee Extension: 3+/5 Right Ankle Dorsiflexion: 3+/5 Right Ankle Plantar Flexion: 3+/5 LLE Assessment LLE Assessment: Exceptions to [facility] General Strength Comments: tested sitting in WC LLE Strength Left Hip Flexion: 3+/5 Left Hip ABduction: 3+/5 Left Hip ADduction: 3+/5 Left Knee Flexion: 3+/5 Left Knee Extension: 3+/5 Left Ankle Dorsiflexion: 3+/5 Left Ankle Plantar Flexion: 4-/5  Care Tool Care Tool Bed Mobility Roll left and right activity   Roll left and right assist level: Minimal Assistance - Patient > 75%    Sit to lying activity   Sit to lying assist level: Maximal Assistance - Patient 25 - 49%    Lying to sitting on side of bed activity   Lying to sitting on side of bed assist level: the ability to move from lying on the back to sitting on the side of the bed with no back support.: Moderate Assistance - Patient 50 - 74%     Care Tool Transfers Sit to stand transfer   Sit to stand assist level: Moderate Assistance - Patient 50 - 74%    Chair/bed transfer   Chair/bed transfer assist level: Maximal Assistance - Patient 25 - 49%    Car transfer   Car transfer assist level: Maximal Assistance - Patient 25 - 49%      Care Tool Locomotion Ambulation   Assist level: 2 helpers Assistive device: Other (comment) (L handrail) Max distance: 5ft  Walk 10 feet activity Walk 10 feet activity did not occur: Safety/medical  concerns (fatigue, weakness/deconditioning)       Walk 50 feet with 2 turns activity Walk 50 feet with 2 turns activity did not occur: Safety/medical concerns (fatigue, weakness/deconditioning)      Walk 150 feet activity Walk 150 feet activity did not occur: Safety/medical concerns (fatigue, weakness/deconditioning)      Walk 10 feet on uneven surfaces activity Walk 10 feet on uneven surfaces activity did not occur: Safety/medical concerns (fatigue, weakness/deconditioning)      Stairs Stair activity did not occur: Safety/medical concerns (fatigue, weakness/deconditioning)        Walk up/down 1  step activity Walk up/down 1 step or curb (drop down) activity did not occur: Safety/medical concerns (fatigue, weakness/deconditioning)      Walk up/down 4 steps activity Walk up/down 4 steps activity did not occur: Safety/medical concerns (fatigue, weakness/deconditioning)      Walk up/down 12 steps activity Walk up/down 12 steps activity did not occur: Safety/medical concerns (fatigue, weakness/deconditioning)      Pick up small objects from floor Pick up small object from the floor (from standing position) activity did not occur: Safety/medical concerns (fatigue, weakness/deconditioning)      Wheelchair Is the patient using a wheelchair?: Yes Type of Wheelchair: Manual   Wheelchair assist level: Dependent - Patient 0% Max wheelchair distance: >375ft  Wheel 50 feet with 2 turns activity   Assist Level: Dependent - Patient 0%  Wheel 150 feet activity   Assist Level: Dependent - Patient 0%    Refer to Care Plan for Long Term Goals  SHORT TERM GOAL WEEK 1 PT Short Term Goal 1 (Week 1): pt will transfer supine<>sitting EOB with min A consistantly PT Short Term Goal 2 (Week 1): pt will transfer sit<>stand with LRAD and min A consistantly PT Short Term Goal 3 (Week 1): pt will perform bed<>chair transfer with LRAD and min A consistantly  Recommendations for other services: None    Skilled Therapeutic Intervention Evaluation completed (see details above and below) with education on PT POC and goals and individual treatment initiated with focus on functional mobility/transfers, generalized strengthening and endurance, dynamic standing balance/coordination, simulated car transfers,and ambulation. Received pt semi-reclined in bed with son at bedside. Pt educated on PT evaluation, CIR policies, and therapy schedule and agreeable. Pt denied any pain during session. Pt transferred semi-reclined<>sitting R EOB with HOB elevated and use of bedrails with min A and required mod A to scoot hips to EOB. Doffed gown and donned pull over shirt and pants sitting EOB with max A. Stood from EOB with RW and mod A and dependent to pull pants over hips. Attempted stand<>pivot into WC, however pt unable to sequence technique, therefore performed squat<>pivot into WC with mod A.   Pt transported to/from room in [facility] dependently for time management purposes. Pt performed simulated car transfer via squat<>pivot with heavy mod/light max A and cues for technique but with poor carry over. Stood in hallway with L handrail and mod A and ambulated 89ft with L handrail and R HHA with mod A +2 for close WC follow - limited by fatigue. Pt stands with bilateral knees flexed in stance and flexed trunk/downward gaze - required verbal and tactile cues for upright posture and light blocking at R knee.   Pt instructed in slideboard transfer to/from mat with mod A and cues for hand placement, head/hips relationship, and scooting technique - pt needs continued practice. Returned to room and pt with active emesis episode - RN notified and present to administer medication. Concluded session with pt sitting in WC, needs within reach, and seatbelt alarm on. Safety plan updated.   Mobility Bed Mobility Bed Mobility: Rolling Right;Supine to Sit Rolling Right: Minimal Assistance - Patient > 75% Supine to Sit: Moderate Assistance  - Patient 50-74% Sit to Supine: Maximal Assistance - Patient 25-49% Transfers Transfers: Sit to Stand;Stand to Sit;Squat Pivot Transfers Sit to Stand: Moderate Assistance - Patient 50-74% Stand to Sit: Moderate Assistance - Patient 50-74% Stand Pivot Transfers: Moderate Assistance - Patient 50 - 74% Stand Pivot Transfer Details: Visual cues/gestures for sequencing;Verbal cues for sequencing;Verbal cues for safe use  of DME/AE;Verbal cues for technique;Verbal cues for precautions/safety Squat Pivot Transfers: Moderate Assistance - Patient 50-74% Transfer (Assistive device): Rolling walker Locomotion  Gait Ambulation: Yes Gait Assistance: 2 Helpers;Moderate Assistance - Patient 50-74% Gait Distance (Feet): 5 Feet Assistive device: Other (Comment) (L handrail) Gait Assistance Details: Tactile cues for initiation;Tactile cues for weight shifting;Verbal cues for technique;Verbal cues for gait pattern;Verbal cues for safe use of DME/AE;Verbal cues for sequencing Gait Gait: Yes Gait Pattern: Impaired Gait Pattern: Step-to pattern;Decreased step length - right;Decreased step length - left;Decreased stride length;Poor foot clearance - left;Poor foot clearance - right;Decreased trunk rotation;Trunk flexed Gait velocity: decreased Stairs / Additional Locomotion Stairs: No Corporate Treasurer: Yes Wheelchair Assistance: Dependent - Patient 0% Wheelchair Parts Management: Needs assistance Distance: >344ft   Discharge Criteria: Patient will be discharged from PT if patient refuses treatment 3 consecutive times without medical reason, if treatment goals not met, if there is a change in medical status, if patient makes no progress towards goals or if patient is discharged from hospital.  The above assessment, treatment plan, treatment alternatives and goals were discussed and mutually agreed upon: by patient and by family  [place] M Hannah [name] PT, DPT 05/01/2024,  2:13 PM

## 2024-05-01 NOTE — Progress Notes (Signed)
 Patient ID: Hannah [name], female   DOB: [birth year], 58 y.o.   MRN: [id number] Met with the patient and son to review current medical situation(right mid brain and scattered micro hemorrhages in thalamic area with SVD and previous CVA), rehab process, team conference and plan of care.  Discussed medications and secondary risk management including HTN, DM (A1C 8.5) on Lantus  and ESRD with renal diet and 1200 cc FR.  Continue to follow along to address educational needs to facilitate preparation for discharge. Fredericka Barnie NOVAK

## 2024-05-01 NOTE — Progress Notes (Signed)
 Spoke with Dr Marcene, we reviewed EKG, no changes. We will continue to monitor.

## 2024-05-01 NOTE — Progress Notes (Signed)
 Patient reports feel;ing dizzy and nauseated this morning. Nausea medication IV Zofran  4 mg administered. Patient blood pressure is 92/53 mmHg, HR 64. Patient to be monitored closely.

## 2024-05-01 NOTE — Progress Notes (Signed)
 Spoke to [place], NP, provider for this patient tonight. Updated her on patient concerns and result of the 12 lead EKG. New orders for Troponin level testing ordered. Orders for aPTT and INR placed for the patient too. Patient to be reviewed with results.

## 2024-05-01 NOTE — Progress Notes (Signed)
 Lab result for Troponin levels posted, elevated at 117. Provider notified. Repeat series ordered.

## 2024-05-01 NOTE — Progress Notes (Signed)
 Inpatient Rehabilitation Care Coordinator Assessment and Plan Patient Details  Name: Hannah [name] MRN: [id number] Date of Birth: 03/07/1966  Today's Date: 05/01/2024  Hospital Problems: Principal Problem:   Small vessel stroke [facility])  Past Medical History:  Past Medical History:  Diagnosis Date   CAD S/P PCI x 2 2009   MI 2009 -> PCI x 2 ([place]); 01/2016 Nuc ST Nonischemic with Normal EF   Cerebral infarction (HCC) 07/12/2017   2009 first one, has had a total of 4 stokes- last 2021   Chronic kidney disease    Hemo M-W- F   Diabetes type 2, controlled (HCC)    Hemiparesis affecting right side as late effect of cerebrovascular accident (CVA) (HCC)    Hyperlipidemia    Hypertension    Intractable vomiting with nausea 07/12/2017   Multiple sclerosis 2009   Myocardial infarction [facility]) 2009   Stroke [facility])    Past Surgical History:  Past Surgical History:  Procedure Laterality Date   A/V FISTULAGRAM Right 02/12/2021   Procedure: A/V FISTULAGRAM;  Surgeon: Gretta Lonni PARAS, MD;  [facility] IN[facility] CV LAB;  Service: Cardiovascular;  Laterality: Right;   A/V FISTULAGRAM Right 05/20/2022   Procedure: A/V Fistulagram;  Surgeon: Gretta Lonni PARAS, MD;  Location: [facility] INVASIVE CV LAB;  Service: Cardiovascular;  Laterality: Right;   A/V FISTULAGRAM N/A 05/05/2023   Procedure: A/V Fistulagram;  Surgeon: Melia Lynwood ORN, MD;  Location: [facility] INVASIVE CV LAB;  Service: Cardiovascular;  Laterality: N/A;   A/V SHUNT INTERVENTION N/A 08/16/2023   Procedure: A/V SHUNT INTERVENTION;  Surgeon: Tobie Gordy POUR, MD;  Location: [facility] INVASIVE CV LAB;  Service: Cardiovascular;  Laterality: N/A;   A/V SHUNTOGRAM Right 09/26/2023   Procedure: A/V SHUNTOGRAM;  Surgeon: Pearline Norman RAMAN, MD;  Location[facility] INVAS[facility] CV LAB;  Service: Cardiovascular;  Laterality: Right;   APPENDECTOMY     AV FISTULA PLACEMENT Right 10/02/2020   Procedure: RIGHT ARTERIOVENOUS (AV) FISTULA CREATION;  Surgeon: Sheree Penne Lonni, MD;  Location: [facility] OR;  Service: Vascular;  Laterality: Right;   AV FISTULA PLACEMENT Right 05/28/2022   Procedure: RIGHT ARM ARTERIOVENOUS (AV) FISTULA REVISION WITH GORETEX GRAFT;  Surgeon: Gretta Lonni PARAS, MD;  [facility]: [facility] OR;  Service: Vascular;  Laterality: Right;   BASCILIC VEIN TRANSPOSITION Right 11/24/2020   Procedure: SECOND STAGE RIGHT ARM BASCILIC VEIN TRANSPOSITION;  Surgeon: Gretta Lonni PARAS, MD;  [facility]: [facility] OR;  Service: Vascular;  Laterality: Right;   CYSTOSTOMY W/ BLADDER BIOPSY     INSERTION OF DIALYSIS CATHETER Right 05/28/2022   Procedure: INSERTION OF TUNNELED DIALYSIS CATHETER IN THE RIGHT INTERNAL JUGULAR;  Surgeon: Gretta Lonni PARAS, MD;  [facility]: [facility] OR;  Service: Vascular;  Laterality: Right;   IR FLUORO GUIDE CV LINE RIGHT  09/29/2020   IR US  GUIDE VASC ACCESS RIGHT  09/29/2020   NM MYOVIEW  LTD  12/25/2015   [place], [place] - NORMAL /NEGATIVE for ischemia. Normal LV Fxn   OVARIAN CYST SURGERY     PERCUTANEOUS CORONARY STENT INTERVENTION (PCI-S)  2009   [place], [place] - 2 stents in settng of MI -- unknown details   PERIPHERAL VASCULAR BALLOON ANGIOPLASTY Right 02/12/2021   Procedure: PERIPHERAL VASCULAR BALLOON ANGIOPLASTY;  Surgeon: Gretta Lonni PARAS, MD;  [facility];  Service: Cardiovascular;  Laterality: Right;  UPPER ARM FISTULA   PERIPHERAL VASCULAR BALLOON ANGIOPLASTY  05/20/2022   Procedure: PERIPHERAL VASCULAR BALLOON ANGIOPLASTY;  Surgeon: Gretta Lonni PARAS, MD;  [facility] INV[facility] CV LAB;  Service:  Cardiovascular;;  rt arm fistula site   PERIPHERAL VASCULAR BALLOON ANGIOPLASTY Right 01/28/2022   Procedure: PERIPHERAL VASCULAR BALLOON ANGIOPLASTY;  Surgeon: Gretta Lonni PARAS, MD;  [facility];  Service: Cardiovascular;  Laterality: Right;  Arm fistula   PERIPHERAL VASCULAR BALLOON ANGIOPLASTY  05/05/2023   Procedure: PERIPHERAL VASCULAR BALLOON ANGIOPLASTY;  Surgeon: Melia Lynwood ORN, MD;   [facility] INVAS[facility] CV LAB;  Service: Cardiovascular;;  RUA AVF   TRANSTHORACIC ECHOCARDIOGRAM  07/11/2017   a) 12/2015, [facility], ): Moderate Conc LVH w/ sigmoid septum.  No LVOT obstruction.  Hyperdynamic LV fxn - EF 65 to 70%.  GR 1 DD & high LAP/LVEDP.  AoV Sclerosis - No AS;; b) 06/2017: [facility]): Normal LV Fxn - EF 60%.  Abnormal septal motion.  Mild LVH mild MAC.  Negative bubble study (for CVA)   TRANSTHORACIC ECHOCARDIOGRAM  12/2020   a) 05/2020: EF 60-65%. No RWMA. Gr2DD. Sm posterior pericardial effusion. No AS.; b) 09/2020: EF 70-75%.  Hyperdynamic.  No RWMA. ? D Fxn/LAP.  Mildly elevated PAP.  Pericardial effusion.  Mild ao V sclerosis cyst.; c) 12/2020 (Limited): EF 65-70%. No RWMA. Gr1-2 DD. Mild Conc LVH- high LAP. Nl PAP. Circumferential effusion w/o tamponade. AoV Sclerosis - no AS. Nl RAP.   ULTRASOUND GUIDANCE FOR VASCULAR ACCESS Right 05/28/2022   Procedure: ULTRASOUND GUIDANCE FOR VASCULAR ACCESS;  Surgeon: Gretta Lonni PARAS, MD;  Location: [facility] OR;  Service: Vascular;  Laterality: Right;   VENOUS ANGIOPLASTY Right 08/16/2023   Procedure: VENOUS ANGIOPLASTY;  Surgeon: Tobie Gordy POUR, MD;  Location: [facility] INVASIVE CV LAB;  Service: Cardiovascular;  Laterality: Right;  70% Innominate; 70% Outflow Basilic   Social History:  reports that she has never smoked. She has never used smokeless tobacco. She reports that she does not currently use alcohol. She reports that she does not use drugs.  Family / Support Systems Patient Roles: Parent Children: 3 daughters, Hannah [name], Hannah [name], Hannah [name] and 1 son, Hannah [name] Anticipated Caregiver: Hannah [name] and Hannah [name] Ability/Limitations of Caregiver: Hannah [name] works at [employer] during the day, Hannah [name] works at night - they rotate shifts Caregiver Availability: 24/7 Family Dynamics: Good famiyl support  Social History Preferred language: English Religion: Non-Denominational Health Literacy - How often do you need to have someone help you  when you read instructions, pamphlets, or other written material from your doctor or pharmacy?: Sometimes Writes: No Employment Status: Retired   Abuse/Neglect Abuse/Neglect Assessment Can Be Completed: Yes Physical Abuse: Denies Verbal Abuse: Denies Sexual Abuse: Denies Exploitation of patient/patient's resources: Denies Self-Neglect: Denies  Patient response to: Social Isolation - How often do you feel lonely or isolated from those around you?: Never  Emotional Status Pt's affect, behavior and adjustment status: Patient adjusting to therapy Recent Psychosocial Issues: None Psychiatric History: None Substance Abuse History: None  Patient / Family Perceptions, Expectations & Goals Pt/Family understanding of illness & functional limitations: Family unerstanding of illness & funcxtonal limitations Premorbid pt/family roles/activities: Homebound Anticipated changes in roles/activities/participation: None anticipated Pt/family expectations/goals: Family has realistic expectations  [employer] Agencies: None Premorbid Home Care/DME Agencies: Other (Comment) (Specialty wheelchair - manual,) Tub bench, Rolling Walker (2 wheels), BSC/3in1, Cane - single point  Transportation available at discharge: Yes - son or daughter Is the patient able to respond to transportation needs?: Yes In the past 12 months, has lack of transportation kept you from medical appointments or from getting medications?: No In the past 12 months, has lack of transportation kept you from meetings, work,  or from getting things needed for daily living?: No Resource referrals recommended: Neuropsychology (12/19)  Discharge Planning Living Arrangements: Children Support Systems: Children Type of Residence: Private residence Insurance Resources: Media [employer] (specify) ([employer] MEDICARE / [facility] MEDICARE) Financial Resources: Family Support Financial Screen Referred: Yes Living Expenses:  Lives with family Money Management: Family Does the patient have any problems obtaining your medications?: No Home Management: Patient's children manage home Patient/Family Preliminary Plans: Plans to return back home with children Care Coordinator Anticipated Follow Up Needs: HH/OP Expected length of stay: ELOS 10 to 14 days  Clinical Impression CSW met with patient/family to introduce herself and complete initial assessment. Patient presented to [facility] following Small vessel stroke. Patient has been to CIR twice in 2022 and went home. Son, Hannah [name] present during visit. Patient is able to make needs known. She does have difficulty with her vision. Marquia owns a publishing rights manager. She also used her Vannie and RW for shorter distances.   There were no further needs or concerns at present. CSW will follow up with family and continue to follow. Will provide patient/family with an update as soon as one becomes available.   Hannah [name]  Hannah [name] 05/01/2024, 3:32 PM

## 2024-05-01 NOTE — Plan of Care (Signed)
°  Problem: Consults Goal: RH STROKE PATIENT EDUCATION Description: See Patient Education module for education specifics  Outcome: Progressing Goal: Diabetes Guidelines if Diabetic/Glucose > 140 Description: If diabetic or lab glucose is > 140 mg/dl - Initiate Diabetes/Hyperglycemia Guidelines & Document Interventions  Outcome: Progressing   Problem: RH BOWEL ELIMINATION Goal: RH STG MANAGE BOWEL WITH ASSISTANCE Description: STG Manage Bowel with mod I Assistance. Outcome: Progressing Goal: RH STG MANAGE BOWEL W/MEDICATION W/ASSISTANCE Description: STG Manage Bowel with Medication with mod I Assistance. Outcome: Progressing   Problem: RH BLADDER ELIMINATION Goal: RH STG MANAGE BLADDER WITH ASSISTANCE Description: STG Manage Bladder With toileting Assistance Outcome: Progressing   Problem: RH SAFETY Goal: RH STG ADHERE TO SAFETY PRECAUTIONS W/ASSISTANCE/DEVICE Description: STG Adhere to Safety Precautions With cues Assistance/Device. Outcome: Progressing   Problem: RH KNOWLEDGE DEFICIT Goal: RH STG INCREASE KNOWLEDGE OF DIABETES Description: Patient and dtr will be able to manage DM using educational resources for medications and dietary modification independently Outcome: Progressing Goal: RH STG INCREASE KNOWLEDGE OF HYPERTENSION Description: Patient and dtr will be able to manage HTN using educational resources for medications and dietary modification independently Outcome: Progressing Goal: RH STG INCREASE KNOWLEGDE OF HYPERLIPIDEMIA Description: Patient and dtr will be able to manage HLD using educational resources for medications and dietary modification independently Outcome: Progressing Goal: RH STG INCREASE KNOWLEDGE OF STROKE PROPHYLAXIS Description: Patient and dtr will be able to manage secondary risks using educational resources for medications and dietary modification independently Outcome: Progressing

## 2024-05-01 NOTE — Evaluation (Signed)
 Occupational Therapy Assessment and Plan  Patient Details  Name: Hannah [name] MRN: [id number] Date of Birth: 1966-02-10  OT Diagnosis: abnormal posture, ataxia, cognitive deficits, disturbance of vision, hemiplegia affecting dominant side, and muscle weakness (generalized) Rehab Potential:   ELOS: 14-16 days   Today's Date: 05/01/2024 OT Individual Time: 9054-8944 OT Individual Time Calculation (min): 70 min     Hospital Problem: Principal Problem:   Small vessel stroke [facility])   Past Medical History:  Past Medical History:  Diagnosis Date   CAD S/P PCI x 2 2009   MI 2009 -> PCI x 2 ([place]); 01/2016 Nuc ST Nonischemic with Normal EF   Cerebral infarction (HCC) 07/12/2017   2009 first one, has had a total of 4 stokes- last 2021   Chronic kidney disease    Hemo M-W- F   Diabetes type 2, controlled (HCC)    Hemiparesis affecting right side as late effect of cerebrovascular accident (CVA) (HCC)    Hyperlipidemia    Hypertension    Intractable vomiting with nausea 07/12/2017   Multiple sclerosis 2009   Myocardial infarction [facility]) 2009   Stroke [facility])    Past Surgical History:  Past Surgical History:  Procedure Laterality Date   A/V FISTULAGRAM Right 02/12/2021   Procedure: A/V FISTULAGRAM;  Surgeon: Gretta Lonni PARAS, MD;  [facility]: [facility] INVASIVE CV LAB;  Service: Cardiovascular;  Laterality: Right;   A/V FISTULAGRAM Right 05/20/2022   Procedure: A/V Fistulagram;  Surgeon: Gretta Lonni PARAS, MD;  Location: [facility] INVASIVE CV LAB;  Service: Cardiovascular;  Laterality: Right;   A/V FISTULAGRAM N/A 05/05/2023   Procedure: A/V Fistulagram;  Surgeon: Melia Lynwood ORN, MD;  Location: [facility] INVASIVE CV LAB;  Service: Cardiovascular;  Laterality: N/A;   A/V SHUNT INTERVENTION N/A 08/16/2023   Procedure: A/V SHUNT INTERVENTION;  Surgeon: Tobie Gordy POUR, MD;  Location: [facility] INVASIVE CV LAB;  Service: Cardiovascular;  Laterality: N/A;   A/V SHUNTOGRAM Right 09/26/2023   Procedure: A/V  SHUNTOGRAM;  Surgeon: Pearline Norman RAMAN, MD;  Location: [facility] INVASIVE CV LAB;  Service: Cardiovascular;  Laterality: Right;   APPENDECTOMY     AV FISTULA PLACEMENT Right 10/02/2020   Procedure: RIGHT ARTERIOVENOUS (AV) FISTULA CREATION;  Surgeon: Sheree Penne Lonni, MD;  Location: [facility] OR;  Service: Vascular;  Laterality: Right;   AV FISTULA PLACEMENT Right 05/28/2022   Procedure: RIGHT ARM ARTERIOVENOUS (AV) FISTULA REVISION WITH GORETEX GRAFT;  Surgeon: Gretta Lonni PARAS, MD;  Location: [facility] OR;  Service: Vascular;  Laterality: Right;   BASCILIC VEIN TRANSPOSITION Right 11/24/2020   Procedure: SECOND STAGE RIGHT ARM BASCILIC VEIN TRANSPOSITION;  Surgeon: Gretta Lonni PARAS, MD;  Location: [facility] OR;  Service: Vascular;  Laterality: Right;   CYSTOSTOMY W/ BLADDER BIOPSY     INSERTION OF DIALYSIS CATHETER Right 05/28/2022   Procedure: INSERTION OF TUNNELED DIALYSIS CATHETER IN THE RIGHT INTERNAL JUGULAR;  Surgeon: Gretta Lonni PARAS, MD;  Location: [facility] OR;  Service: Vascular;  Laterality: Right;   IR FLUORO GUIDE CV LINE RIGHT  09/29/2020   IR US  GUIDE VASC ACCESS RIGHT  09/29/2020   NM MYOVIEW  LTD  12/25/2015   [place], [place] - NORMAL /NEGATIVE for ischemia. Normal LV Fxn   OVARIAN CYST SURGERY     PERCUTANEOUS CORONARY STENT INTERVENTION (PCI-S)  2009   [place], [place] - 2 stents in settng of MI -- unknown details   PERIPHERAL VASCULAR BALLOON ANGIOPLASTY Right 02/12/2021   Procedure: PERIPHERAL VASCULAR BALLOON ANGIOPLASTY;  Surgeon: Gretta Lonni PARAS, MD;  Location: [facility] INVASIVE  CV LAB;  Service: Cardiovascular;  Laterality: Right;  UPPER ARM FISTULA   PERIPHERAL VASCULAR BALLOON ANGIOPLASTY  05/20/2022   Procedure: PERIPHERAL VASCULAR BALLOON ANGIOPLASTY;  Surgeon: Gretta Lonni PARAS, MD;  [facility]VAS[facility] CV LAB;  Service: Cardiovascular;;  rt arm fistula site   PERIPHERAL VASCULAR BALLOON ANGIOPLASTY Right 01/28/2022   Procedure: PERIPHERAL VASCULAR BALLOON ANGIOPLASTY;  Surgeon:  Gretta Lonni PARAS, MD;  [facility] CV LAB;  Service: Cardiovascular;  Laterality: Right;  Arm fistula   PERIPHERAL VASCULAR BALLOON ANGIOPLASTY  05/05/2023   Procedure: PERIPHERAL VASCULAR BALLOON ANGIOPLASTY;  Surgeon: Melia Lynwood ORN, MD;  [facility]: [facility] INVASIVE CV LAB;  Service: Cardiovascular;;  RUA AVF   TRANSTHORACIC ECHOCARDIOGRAM  07/11/2017   a) 12/2015, [place], [place]): Moderate Conc LVH w/ sigmoid septum.  No LVOT obstruction.  Hyperdynamic LV fxn - EF 65 to 70%.  GR 1 DD & high LAP/LVEDP.  AoV Sclerosis - No AS;; b) 06/2017: [facility]): Normal LV Fxn - EF 60%.  Abnormal septal motion.  Mild LVH mild MAC.  Negative bubble study (for CVA)   TRANSTHORACIC ECHOCARDIOGRAM  12/2020   a) 05/2020: EF 60-65%. No RWMA. Gr2DD. Sm posterior pericardial effusion. No AS.; b) 09/2020: EF 70-75%.  Hyperdynamic.  No RWMA. ? D Fxn/LAP.  Mildly elevated PAP.  Pericardial effusion.  Mild ao V sclerosis cyst.; c) 12/2020 (Limited): EF 65-70%. No RWMA. Gr1-2 DD. Mild Conc LVH- high LAP. Nl PAP. Circumferential effusion w/o tamponade. AoV Sclerosis - no AS. Nl RAP.   ULTRASOUND GUIDANCE FOR VASCULAR ACCESS Right 05/28/2022   Procedure: ULTRASOUND GUIDANCE FOR VASCULAR ACCESS;  Surgeon: Gretta Lonni PARAS, MD;  Location: [facility] OR;  Service: Vascular;  Laterality: Right;   VENOUS ANGIOPLASTY Right 08/16/2023   Procedure: VENOUS ANGIOPLASTY;  Surgeon: Tobie Gordy POUR, MD;  Location: [facility] INVASIVE CV LAB;  Service: Cardiovascular;  Laterality: Right;  70% Innominate; 70% Outflow Basilic    Assessment & Plan Clinical Impression:Lodie Creedon is a 58 year old RH-female with history of CVA with chronic right sided weakness, CAD s/p PCI, HTN, MS, chorioretinitis, chronic LBP, T2DM- HD MWF wjp was admitted on 04/23/24 with onset peripheral vision loss, left facial weakness with left sided weakness and aphasia. MRI brain done revealing acute infarct right dorsal midbrain and multiple scattered chronic  microhemorrhages particularly within the thalami and brain stem. CTA  head/neck negative for  intracranial disease or stenosis.  2 D echo showed EF 65-70% with no wall abnormalities and small pericardial effusion. Dr. Jerri felt that stroke was due to small vessel disease and Plavix  changed to Brillinta and ASA added with recommendations to continue for 30 days then change to ASA 81 mg indefinitely.  P2Y12 level 229 (normal).   HD on going MWF and she has had some issues with prolonged bleeding from AVG site. Anemia being monitored and BP control improving. PT/OT/ST has been working with patient who requires mod to max assist with ADLs with cues to compensated for left lean, dysarthria is at baseline and requires mod assist for mobility and able to take a couple of steps. At baseline she was independent at wheelchair level and was able to walk a few steps with use of walker. CIR recommended due to functional decline.    Patient currently requires max with basic self-care skills secondary to muscle weakness, decreased cardiorespiratoy endurance, impaired timing and sequencing, ataxia, decreased coordination, and decreased motor planning, decreased visual perceptual skills and decreased visual motor skills, decreased motor planning, decreased initiation,  decreased attention, decreased awareness, decreased problem solving, decreased safety awareness, decreased memory, and delayed processing, and decreased sitting balance, decreased standing balance, decreased postural control, hemiplegia, and decreased balance strategies.  Prior to hospitalization, patient could complete BADL with MOD I - sometimes needing assist from daughter and son likely MIN-MOD.  Patient will benefit from skilled intervention to decrease level of assist with basic self-care skills and increase independence with basic self-care skills prior to discharge home with care partner.  Anticipate patient will require 24 hour supervision and follow up  home health.  OT - End of Session Activity Tolerance: Tolerates 10 - 20 min activity with multiple rests Endurance Deficit: Yes OT Assessment OT Barriers to Discharge: Home environment access/layout OT Patient demonstrates impairments in the following area(s): Balance;Behavior;Cognition;Endurance;Motor;Safety;Vision OT Basic ADL's Functional Problem(s): Eating;Grooming;Bathing;Dressing;Toileting OT Advanced ADL's Functional Problem(s): None OT Transfers Functional Problem(s): Toilet;Tub/Shower OT Additional Impairment(s): None OT Plan OT Intensity: Minimum of 1-2 x/day, 45 to 90 minutes OT Frequency: 5 out of 7 days OT Duration/Estimated Length of Stay: 14-16 days OT Treatment/Interventions: Balance/vestibular training;Disease mangement/prevention;Neuromuscular re-education;Self Care/advanced ADL retraining;Therapeutic Exercise;Wheelchair propulsion/positioning;Cognitive remediation/compensation;DME/adaptive equipment instruction;Pain management;Skin care/wound managment;UE/LE Strength taining/ROM;Community reintegration;Functional electrical stimulation;Patient/family education;Splinting/orthotics;UE/LE Coordination activities;Discharge planning;Functional mobility training;Psychosocial support;Therapeutic Activities;Visual/perceptual remediation/compensation OT Self Feeding Anticipated Outcome(s): set up OT Basic Self-Care Anticipated Outcome(s): MIN A OT Toileting Anticipated Outcome(s): MIN A OT Bathroom Transfers Anticipated Outcome(s): Supervision OT Recommendation Recommendations for Other Services: None Patient destination: Home Follow Up Recommendations: Home health OT Equipment Recommended: To be determined   OT Evaluation Precautions/Restrictions  Precautions Precautions: Fall Precaution/Restrictions Comments: residual R hemiparesis Restrictions Weight Bearing Restrictions Per Provider Order: No Pain Pain Assessment Pain Scale: 0-10 Pain Score: 0-No pain Pain  Location: Abdomen Pain Intervention(s): MD notified (Comment);RN made aware;Repositioned Multiple Pain Sites: No Home Living/Prior Functioning Home Living Family/patient expects to be discharged to:: Private residence Living Arrangements: Children Available Help at Discharge: Family, Available 24 hours/day Type of Home: Apartment Home Access: Stairs to enter [employer] of Steps: threshold Entrance Stairs-Rails: None Home Layout: One level Bathroom Shower/Tub: [employer] Accessibility: Yes Additional Comments: per documentation, daughter has taken on more ADL role for effeciency but would like pt to be more indep  Lives With: Daughter, Son IADL History Education: some college Prior Function Level of Independence: Needs assistance with ADLs, Needs assistance with tranfers, Needs assistance with homemaking, Requires assistive device for independence Vocation: Unemployed Vision Baseline Vision/History: 0 No visual deficits Ability to See in Adequate Light: 1 Impaired Patient Visual Report: Diplopia;Blurring of vision;Peripheral vision impairment Vision Assessment?: Yes Additional Comments: Pt reports decreased peripheral vision, visual deficits consistent in functional skill performance Perception  Perception: Within Functional Limits Praxis Praxis: Impaired Cognition Cognition Overall Cognitive Status: Impaired/Different from baseline Arousal/Alertness: Awake/alert Orientation Level: Person;Place;Situation Person: Oriented Place: Oriented Situation: Oriented Memory: Impaired Memory Impairment: Retrieval deficit;Storage deficit Attention: Sustained Sustained Attention: Appears intact Awareness: Impaired Awareness Impairment: Emergent impairment;Anticipatory impairment Problem Solving: Impaired Problem Solving Impairment: Verbal basic;Functional basic Executive Function: Sequencing;Organizing Sequencing:  Impaired Sequencing Impairment: Verbal basic;Functional basic Organizing: Impaired Organizing Impairment: Verbal basic;Functional basic Safety/Judgment: Appears intact Brief Interview for Mental Status (BIMS) Repetition of Three Words (First Attempt): 3 Temporal Orientation: Year: Correct Temporal Orientation: Month: Accurate within 5 days Temporal Orientation: Day: Correct Recall: Sock: Yes, no cue required Recall: Blue: Yes, no cue required Recall: Bed: No, could not recall BIMS Summary Score: 13 Sensation Sensation Light Touch: Appears Intact Coordination Gross Motor Movements are Fluid and Coordinated: No Fine Motor  Movements are Fluid and Coordinated: No Coordination and Movement Description: severely limited FMC/GMC 2/2 R hemi Finger Nose Finger Test: LUE ataxic, RUE unable Motor  Motor Motor: Hemiplegia  Trunk/Postural Assessment  Cervical Assessment Cervical Assessment: Exceptions to [facility] Thoracic Assessment Thoracic Assessment: Exceptions to [facility] Lumbar Assessment Lumbar Assessment: Exceptions to [facility] Postural Control Postural Control: Deficits on evaluation  Balance Balance Balance Assessed: Yes Static Sitting Balance Static Sitting - Balance Support: Feet unsupported Static Sitting - Level of Assistance: 5: Stand by assistance Dynamic Sitting Balance Dynamic Sitting - Balance Support: Feet unsupported Dynamic Sitting - Level of Assistance: 4: Min assist Dynamic Sitting - Balance Activities: Lateral lean/weight shifting;Forward lean/weight shifting Static Standing Balance Static Standing - Balance Support: During functional activity Static Standing - Level of Assistance: 3: Mod assist Dynamic Standing Balance Dynamic Standing - Balance Support: Bilateral upper extremity supported Dynamic Standing - Level of Assistance: 3: Mod assist Dynamic Standing - Balance Activities: Lateral lean/weight shifting;Forward lean/weight shifting Extremity/Trunk  Assessment RUE Assessment RUE Assessment: Exceptions to [facility] Passive Range of Motion (PROM) Comments: limited PROM <90 for shoulder and increased tone in elbow Active Range of Motion (AROM) Comments: severely limited LUE Assessment LUE Assessment: Within Functional Limits  Care Tool Care Tool Self Care Eating   Eating Assist Level: Set up assist    Oral Care    Oral Care Assist Level: Set up assist    Bathing   Body parts bathed by patient: Right arm;Chest;Abdomen;Right upper leg;Left upper leg;Face Body parts bathed by helper: Left arm;Buttocks;Front perineal area;Right lower leg;Left lower leg   Assist Level: Maximal Assistance - Patient 24 - 49%    Upper Body Dressing(including orthotics)   What is the patient wearing?: Hospital gown only   Assist Level: Maximal Assistance - Patient 25 - 49%    Lower Body Dressing (excluding footwear)   What is the patient wearing?: Underwear/pull up Assist for lower body dressing: Total Assistance - Patient < 25%    Putting on/Taking off footwear   What is the patient wearing?: Non-skid slipper socks Assist for footwear: Maximal Assistance - Patient 25 - 49%       Care Tool Toileting Toileting activity   Assist for toileting: Maximal Assistance - Patient 25 - 49%     Care Tool Bed Mobility Roll left and right activity    MOD    Sit to lying activity   Sit to lying assist level: Maximal Assistance - Patient 25 - 49%    Lying to sitting on side of bed activity   Lying to sitting on side of bed assist level: the ability to move from lying on the back to sitting on the side of the bed with no back support.: Moderate Assistance - Patient 50 - 74%     Care Tool Transfers Sit to stand transfer   Sit to stand assist level: Moderate Assistance - Patient 50 - 74%    Chair/bed transfer   Chair/bed transfer assist level: Moderate Assistance - Patient 50 - 74%     Toilet transfer   Assist Level: Moderate Assistance - Patient 50 -  74%     Care Tool Cognition  Expression of Ideas and Wants Expression of Ideas and Wants: 4. Without difficulty (complex and basic) - expresses complex messages without difficulty and with speech that is clear and easy to understand  Understanding Verbal and Non-Verbal Content Understanding Verbal and Non-Verbal Content: 4. Understands (complex and basic) - clear comprehension without cues or repetitions  Memory/Recall Ability Memory/Recall Ability : Current season;That he or she is in a hospital/hospital unit   Refer to Care Plan for Long Term Goals  SHORT TERM GOAL WEEK 1 OT Short Term Goal 1 (Week 1): Pt will perform toilet transfers MIN A OT Short Term Goal 2 (Week 1): Pt will perform LB dressing MOD A OT Short Term Goal 3 (Week 1): Pt will improve sitting at EOB for 10 minutes to perform ADL task  Recommendations for other services: None    Skilled Therapeutic Intervention ADL ADL Eating: Set up Grooming: Minimal assistance Upper Body Bathing: Moderate assistance Lower Body Bathing: Maximal assistance Upper Body Dressing: Maximal assistance Lower Body Dressing: Maximal assistance Toileting: Maximal assistance Toilet Transfer: Moderate assistance Toilet Transfer Method: Stand pivot Mobility  Bed Mobility Bed Mobility: Sit to Supine;Supine to Sit Supine to Sit: Moderate Assistance - Patient 50-74% Sit to Supine: Maximal Assistance - Patient 25-49% Transfers Sit to Stand: Moderate Assistance - Patient 50-74% Stand to Sit: Moderate Assistance - Patient 50-74%  Skilled Interventions: Pt bed level at time of session, see above and below for details. Note extensive portion at beginning of session discussing role and purpose of OT and pt son present and in agreement. Focus of session on sitting up at EOB multiple times, pt having intense dizziness and note pt's BP had been low this AM, checked multiple times throughout session.  Supine BP: 114/57 Sitting up at EOB:  95/47 Post transfer: 115/65 Note sitting up in wheelchair pt with intense dizziness and checked BP multiple times with BP staying consistent. Pt with feeling nauseous, PLB and no emesis, able to wash face with MIN A. Pt feeling very fatigued and unable to finish dressing tasks. Stand pivot back to bed MOD A with step by step cues and positioned bed level, alarm on call bell inr each.   Discharge Criteria: Patient will be discharged from OT if patient refuses treatment 3 consecutive times without medical reason, if treatment goals not met, if there is a change in medical status, if patient makes no progress towards goals or if patient is discharged from hospital.  The above assessment, treatment plan, treatment alternatives and goals were discussed and mutually agreed upon: by patient and by family  Chiquita JAYSON Hopping 05/01/2024, 12:32 PM

## 2024-05-01 NOTE — Plan of Care (Signed)
°  Problem: RH Expression Communication Goal: LTG Patient will increase speech intelligibility (SLP) Description: LTG: Patient will increase speech intelligibility at word/phrase/conversation level with cues, % of the time (SLP) Flowsheets (Taken 05/01/2024 0914) LTG: Patient will increase speech intelligibility (SLP): Minimal Assistance - Patient > 75% Level: (sentence level) Other (Comment) Percent of time patient will use intelligible speech: 90   Problem: RH Problem Solving Goal: LTG Patient will demonstrate problem solving for (SLP) Description: LTG:  Patient will demonstrate problem solving for basic/complex daily situations with cues  (SLP) Flowsheets (Taken 05/01/2024 0914) LTG: Patient will demonstrate problem solving for (SLP): Basic daily situations LTG Patient will demonstrate problem solving for: Supervision   Problem: RH Memory Goal: LTG Patient will use memory compensatory aids to (SLP) Description: LTG:  Patient will use memory compensatory aids to recall biographical/new, daily complex information with cues (SLP) Flowsheets (Taken 05/01/2024 0914) LTG: Patient will use memory compensatory aids to (SLP): Minimal Assistance - Patient > 75%

## 2024-05-01 NOTE — Progress Notes (Signed)
 Patient ID: Hannah [name], female   DOB: 1965/06/06, 58 y.o.   MRN: [id number]  Statement of service delivered.

## 2024-05-01 NOTE — Progress Notes (Signed)
 Repeat Troponin levels received, elevated at 119. Danny, PA in the unit, appraised of the results and thinks this might be stress-related as compared to ACS. We will continue monitoring the patient closely, she denies any chest pain symptoms currently.

## 2024-05-01 NOTE — Progress Notes (Signed)
 PROGRESS NOTE   Subjective/Complaints: Hypotensive, decreased amlodipine  to 5mg  Nauseous, may be 2/2 to hypotension Hgb slightly decreased, stool occult ordered and repeat CBC ordered for tomorrow  ROS: nausea improved   Objective:   No results found. Recent Labs    04/30/24 0338 04/30/24 2249  WBC 5.3 5.4  HGB 9.8* 9.7*  HCT 31.1* 30.2*  PLT 191 191   Recent Labs    04/29/24 0634 04/30/24 0338  NA 134* 133*  K 4.5 5.0  CL 96* 95*  CO2 29 30  GLUCOSE 158* 174*  BUN 30* 38*  CREATININE 6.09* 7.91*  CALCIUM  9.4 9.0    Intake/Output Summary (Last 24 hours) at 05/01/2024 1123 Last data filed at 05/01/2024 9157 Gross per 24 hour  Intake 118 ml  Output --  Net 118 ml     Wound 04/30/24 1723 Pressure Injury Sacrum Left Stage 2 -  Partial thickness loss of dermis presenting as a shallow open injury with a red, pink wound bed without slough. (Active)    Physical Exam: Vital Signs Blood pressure (!) 92/53, pulse 84, temperature 98.2 F (36.8 C), temperature source Oral, resp. rate 16, height 5' 2.4 (1.585 m), weight 41.6 kg, SpO2 100%. Cardiovascular:     Heart sounds: Murmur heard.  Musculoskeletal:     Comments: BLE with muscle wasting. Tight heel cords on right.   Neurological:     Mental Status: She is alert and oriented to person, place, and time.     Comments: Clear but ataxic speech. Right sided weakness with mild right facial weakness. She was able to follow simple motor commands. Decrease in fine motor movement RUE and contracture right elbow.     General: No acute distress Mood and affect are appropriate Heart: Regular rate and rhythm no rubs murmurs or extra sounds Lungs: Clear to auscultation, breathing unlabored, no rales or wheezes Abdomen: Positive bowel sounds, soft nontender to palpation, nondistended Extremities: No clubbing, cyanosis, or edema Skin: No evidence of breakdown, no  evidence of rash Neurologic: Extraocular muscles intact, visual fields show bitemporal field loss, Motor strength left upper extremity 5/5 in the deltoid bicep tricep grip Motor strength left lower extremity 5/5 in the hip flexors knee extensors ankle dorsiflexor Motor strength in the right upper extremity limited by contracture at the right elbow and right shoulder 3 - at each muscle group 3 - at the tricep 3 - at the finger flexors and extensors Right lower extremity 3 - right hip flexor knee extensor 2 - ankle dorsiflexor Sensation intact to light touch bilateral upper and lower limb Cerebellar testing limited on the right due to weakness in the right upper extremity Intact left upper extremity Musculoskeletal: Full range of motion in all 4 extremities. No joint swelling Contracture at the right shoulder only has 50% range with shoulder abduction forward flexion and external rotation Has 50% range at the right elbow, lacks approximately 60 degrees of extension Contracture right ankle unable to dorsiflex to neutral No pain with range of motion Stable 12/16     Assessment/Plan: 1. Functional deficits which require 3+ hours per day of interdisciplinary therapy in a comprehensive inpatient rehab setting. Physiatrist is providing  close team supervision and 24 hour management of active medical problems listed below. Physiatrist and rehab team continue to assess barriers to discharge/monitor patient progress toward functional and medical goals  Care Tool:  Bathing              Bathing assist       Upper Body Dressing/Undressing Upper body dressing        Upper body assist      Lower Body Dressing/Undressing Lower body dressing            Lower body assist       Toileting Toileting    Toileting assist       Transfers Chair/bed transfer  Transfers assist           Locomotion Ambulation   Ambulation assist              Walk 10 feet  activity   Assist           Walk 50 feet activity   Assist           Walk 150 feet activity   Assist           Walk 10 feet on uneven surface  activity   Assist           Wheelchair     Assist               Wheelchair 50 feet with 2 turns activity    Assist            Wheelchair 150 feet activity     Assist          Blood pressure (!) 92/53, pulse 84, temperature 98.2 F (36.8 C), temperature source Oral, resp. rate 16, height 5' 2.4 (1.585 m), weight 41.6 kg, SpO2 100%.  Medical Problem List and Plan: 1. Functional deficits secondary to RIght dorsal midbrain infarct             -patient may  shower             -ELOS/Goals: 10-14d sup/minA  Chart and therapy notes reviewed, continue CIR, discussed patient's progress with therapy Grounds pass ordered Vitamin D3/Metanx/Vitamin B/C complex ordered F/u with me or Fidela in clinic within 1 month Would benefit from home health aide upon discharge  This patient is capable of making decisions on her own behalf.  2.  Anemia: Heparin , repeat CBC tomorrow, stool occult ordered             -antiplatelet therapy: ASA/Ticagrelor   3. H/o Sacroiliitis: Tylenol  prn mild pain. Decrease hydrocodone  to 1 tab q12H for severe pain  4. Insomnia: continue prn melatonin   5. T2DM:  Hgb A1c-  8.5. Monitor BS ac/hs and use SSI for elevated BS.              --was on insulin  glargine 12 units w/ 5 units novolog  TID. Increase glargine to 11U             --will continue to monitor BS for trend and titrate insulin  as needed.   6. ESRD: HD MWF at the end of the day to help with tolerance of therapy.              --Phoslo  tid ac for metabolic bone disease.   Continue fluid restriction  7. Hypotension: decrease amlodipine  to 5mg , continue coreg , hydralazine  and imdur .  8. 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 .   9. Constipation: asked nursing to please offer prune  juice and if this is  ineffective today to administer suppository  10. GERD: continue pepcid   LOS: 1 days A FACE TO FACE EVALUATION WAS PERFORMED  Noretta Frier P Daymen Hassebrock 05/01/2024, 11:23 AM

## 2024-05-01 NOTE — Consult Note (Addendum)
 [facility] Renal Consultation Note    Indication for Consultation:  Management of ESRD/hemodialysis; anemia, hypertension/volume and secondary hyperparathyroidism  ERE:Fjiizw, Artist PARAS, MD  HPI: Shalisha Clausing is a 58 y.o. female with ESRD on HD MWF at [facility]. She has a past medical history significant for HTN, CAD, T2DM, MS, prior CVA with R hemiparesis who was admitted with acute R midbrain stroke.   Patient has now been transferred to CIR. Seen and examined patient at bedside. She reports bleeding from access site overnight and her access is now wrapped. No active bleeding on exam today. Doesn't appear heparin  boluses are ordered in outpatient. She tolerated yesterday's HD with 1.5L removed. Next HD 12/17.  Past Medical History:  Diagnosis Date   CAD S/P PCI x 2 2009   MI 2009 -> PCI x 2 ([place]); 01/2016 Nuc ST Nonischemic with Normal EF   Cerebral infarction (HCC) 07/12/2017   2009 first one, has had a total of 4 stokes- last 2021   Chronic kidney disease    Hemo M-W- F   Diabetes type 2, controlled (HCC)    Hemiparesis affecting right side as late effect of cerebrovascular accident (CVA) (HCC)    Hyperlipidemia    Hypertension    Intractable vomiting with nausea 07/12/2017   Multiple sclerosis 2009   Myocardial infarction [facility]) 2009   Stroke [facility])    Past Surgical History:  Procedure Laterality Date   A/V FISTULAGRAM Right 02/12/2021   Procedure: A/V FISTULAGRAM;  Surgeon: Gretta Lonni PARAS, MD;  Location: [facility] INVASIVE CV LAB;  Service: Cardiovascular;  Laterality: Right;   A/V FISTULAGRAM Right 05/20/2022   Procedure: A/V Fistulagram;  Surgeon: Gretta Lonni PARAS, MD;  Location[facility] INVASIVE CV LAB;  Service: Cardiovascular;  Laterality: Right;   A/V FISTULAGRAM N/A 05/05/2023   Procedure: A/V Fistulagram;  Surgeon: Melia Lynwood ORN, MD;  Location: [facility] INVASIVE CV LAB;  Service: Cardiovascular;  Laterality: N/A;   A/V SHUNT  INTERVENTION N/A 08/16/2023   Procedure: A/V SHUNT INTERVENTION;  Surgeon: Tobie Gordy POUR, MD;  Location: [facility] INVASIVE CV LAB;  Service: Cardiovascular;  Laterality: N/A;   A/V SHUNTOGRAM Right 09/26/2023   Procedure: A/V SHUNTOGRAM;  Surgeon: Pearline Norman RAMAN, MD;  Location: [facility] INVASIVE CV LAB;  Service: Cardiovascular;  Laterality: Right;   APPENDECTOMY     AV FISTULA PLACEMENT Right 10/02/2020   Procedure: RIGHT ARTERIOVENOUS (AV) FISTULA CREATION;  Surgeon: Sheree Penne Lonni, MD;  Location: [facility] OR;  Service: Vascular;  Laterality: Right;   AV FISTULA PLACEMENT Right 05/28/2022   Procedure: RIGHT ARM ARTERIOVENOUS (AV) FISTULA REVISION WITH GORETEX GRAFT;  Surgeon: Gretta Lonni PARAS, MD;  Location: [facility] OR;  Service: Vascular;  Laterality: Right;   BASCILIC VEIN TRANSPOSITION Right 11/24/2020   Procedure: SECOND STAGE RIGHT ARM BASCILIC VEIN TRANSPOSITION;  Surgeon: Gretta Lonni PARAS, MD;  Location: [facility] OR;  Service: Vascular;  Laterality: Right;   CYSTOSTOMY W/ BLADDER BIOPSY     INSERTION OF DIALYSIS CATHETER Right 05/28/2022   Procedure: INSERTION OF TUNNELED DIALYSIS CATHETER IN THE RIGHT INTERNAL JUGULAR;  Surgeon: Gretta Lonni PARAS, MD;  Location: [facility] OR;  Service: Vascular;  Laterality: Right;   IR FLUORO GUIDE CV LINE RIGHT  09/29/2020   IR US  GUIDE VASC ACCESS RIGHT  09/29/2020   NM MYOVIEW  LTD  12/25/2015   [place], [place] - NORMAL /NEGATIVE for ischemia. Normal LV Fxn   OVARIAN CYST SURGERY     PERCUTANEOUS CORONARY STENT INTERVENTION (PCI-S)  2009  [place], [place] - 2 stents in settng of MI -- unknown details   PERIPHERAL VASCULAR BALLOON ANGIOPLASTY Right 02/12/2021   Procedure: PERIPHERAL VASCULAR BALLOON ANGIOPLASTY;  Surgeon: Gretta Lonni PARAS, MD;  [facility] INV[facility] CV LAB;  Service: Cardiovascular;  Laterality: Right;  UPPER ARM FISTULA   PERIPHERAL VASCULAR BALLOON ANGIOPLASTY  05/20/2022   Procedure: PERIPHERAL VASCULAR BALLOON ANGIOPLASTY;  Surgeon: Gretta Lonni PARAS, MD;  [facility] INVAS[facility] CV LAB;  Service: Cardiovascular;;  rt arm fistula site   PERIPHERAL VASCULAR BALLOON ANGIOPLASTY Right 01/28/2022   Procedure: PERIPHERAL VASCULAR BALLOON ANGIOPLASTY;  Surgeon: Gretta Lonni PARAS, MD;  [facility] INVASIVE CV LAB;  Service: Cardiovascular;  Laterality: Right;  Arm fistula   PERIPHERAL VASCULAR BALLOON ANGIOPLASTY  05/05/2023   Procedure: PERIPHERAL VASCULAR BALLOON ANGIOPLASTY;  Surgeon: Melia Lynwood ORN, MD;  Location: [facility] INVASIVE CV LAB;  Service: Cardiovascular;;  RUA AVF   TRANSTHORACIC ECHOCARDIOGRAM  07/11/2017   a) 12/2015, [place], [place]): Moderate Conc LVH w/ sigmoid septum.  No LVOT obstruction.  Hyperdynamic LV fxn - EF 65 to 70%.  GR 1 DD & high LAP/LVEDP.  AoV Sclerosis - No AS;; b) 06/2017: [facility]): Normal LV Fxn - EF 60%.  Abnormal septal motion.  Mild LVH mild MAC.  Negative bubble study (for CVA)   TRANSTHORACIC ECHOCARDIOGRAM  12/2020   a) 05/2020: EF 60-65%. No RWMA. Gr2DD. Sm posterior pericardial effusion. No AS.; b) 09/2020: EF 70-75%.  Hyperdynamic.  No RWMA. ? D Fxn/LAP.  Mildly elevated PAP.  Pericardial effusion.  Mild ao V sclerosis cyst.; c) 12/2020 (Limited): EF 65-70%. No RWMA. Gr1-2 DD. Mild Conc LVH- high LAP. Nl PAP. Circumferential effusion w/o tamponade. AoV Sclerosis - no AS. Nl RAP.   ULTRASOUND GUIDANCE FOR VASCULAR ACCESS Right 05/28/2022   Procedure: ULTRASOUND GUIDANCE FOR VASCULAR ACCESS;  Surgeon: Gretta Lonni PARAS, MD;  Location: [facility] OR;  Service: Vascular;  Laterality: Right;   VENOUS ANGIOPLASTY Right 08/16/2023   Procedure: VENOUS ANGIOPLASTY;  Surgeon: Tobie Gordy POUR, MD;  Location: [facility] INVASIVE CV LAB;  Service: Cardiovascular;  Laterality: Right;  70% Innominate; 70% Outflow Basilic   No family history on file. Social History:  reports that she has never smoked. She has never used smokeless tobacco. She reports that she does not currently use alcohol. She reports that she  does not use drugs. Allergies[1] Prior to Admission medications  Medication Sig Start Date End Date Taking? Authorizing Provider  acetaminophen  (TYLENOL ) 500 MG tablet Take 500 mg by mouth every 6 (six) hours as needed for mild pain (pain score 1-3), moderate pain (pain score 4-6), fever or headache.    [provider]  amLODipine  (NORVASC ) 10 MG tablet Take 1 tablet (10 mg total) by mouth daily. Except on dialysis days do not take a dose. 06/08/22   Lynwood Schilling, MD  aspirin  EC 81 MG tablet Take 1 tablet (81 mg total) by mouth daily. Swallow whole. 05/01/24   Perri DELENA Meliton Mickey., MD  azaTHIOprine  (IMURAN ) 50 MG tablet Take 1 tablet (50 mg total) by mouth daily. 04/30/24 04/30/25  Perri DELENA Meliton Mickey., MD  calcium  acetate (PHOSLO ) 667 MG capsule Take 667 mg by mouth in the morning, at noon, and at bedtime. 11/11/20   [provider]  carvedilol  (COREG ) 25 MG tablet Take 1 tablet (25 mg total) by mouth 2 (two) times daily with a meal. And as directed. 06/08/22   Lynwood Schilling, MD  clopidogrel  (PLAVIX ) 75 MG tablet Take 75 mg  by mouth daily.    [provider]  Continuous Blood Gluc Sensor (FREESTYLE LIBRE 2 SENSOR) MISC USE AS DIRECTED CHANGE EVERY 14 DAYS 12/30/20   [provider]  diclofenac  Sodium (VOLTAREN ) 1 % GEL Apply 2 g topically 4 (four) times daily. To leg Patient taking differently: Apply 2 g topically 4 (four) times daily as needed (pain). To leg 11/03/20   Love, Sharlet RAMAN, PA-C  famotidine  (PEPCID ) 20 MG tablet Take 1 tablet (20 mg total) by mouth daily. 11/03/20   Love, Sharlet RAMAN, PA-C  hydrALAZINE  (APRESOLINE ) 50 MG tablet Take 1 tablet (50 mg total) by mouth 3 (three) times daily. 04/30/24   Perri DELENA Meliton Mickey., MD  HYDROcodone -acetaminophen  (NORCO/VICODIN) 5-325 MG tablet Take 1 tablet by mouth every 12 (twelve) hours as needed for moderate pain (pain score 4-6) or severe pain (pain score 7-10). 04/13/24   [provider]  insulin   degludec (TRESIBA  FLEXTOUCH) 100 UNIT/ML FlexTouch Pen Inject 12 Units into the skin at bedtime. Patient taking differently: Inject 8-10 Units into the skin at bedtime. 11/03/20   Love, Sharlet RAMAN, PA-C  insulin  lispro (HUMALOG ) 100 UNIT/ML KwikPen Inject 0-9 Units into the skin 3 (three) times daily. CBG < 70: treat low blood sugar  CBG 70 - 120: 0 units  CBG 121 - 150: 1 unit  CBG 151 - 200: 2 units  CBG 201 - 250: 3 units  CBG 251 - 300: 5 units  CBG 301 - 350: 7 units  CBG 351 - 400: 9 units  CBG > 400: call MD 04/30/24   Perri DELENA Meliton Mickey., MD  isosorbide  mononitrate (IMDUR ) 30 MG 24 hr tablet Take 1 tablet (30 mg total) by mouth at bedtime. 12/20/23 04/23/24  Anner Alm ORN, MD  lidocaine -prilocaine  (EMLA ) cream Apply 1 Application topically daily as needed (port access). 03/02/22   [provider]  metoCLOPramide  (REGLAN ) 5 MG tablet Take 5 mg by mouth every 6 (six) hours as needed for nausea or vomiting. 09/16/21   [provider]  naloxone  (NARCAN ) nasal spray 4 mg/0.1 mL Place 0.4 mg into the nose as needed.    [provider]  nitroGLYCERIN  (NITROSTAT ) 0.4 MG SL tablet Place 1 tablet (0.4 mg total) under the tongue every 5 (five) minutes as needed for chest pain. 06/25/20   Angiulli, Toribio PARAS, PA-C  ondansetron  (ZOFRAN ) 4 MG tablet Take 4 mg by mouth every 8 (eight) hours as needed for nausea or vomiting. 01/07/23   [provider]  polyethylene glycol (MIRALAX  / GLYCOLAX ) 17 g packet Take 17 g by mouth daily. Patient taking differently: Take 17 g by mouth daily as needed for moderate constipation. 11/04/20   Love, Sharlet RAMAN, PA-C  rosuvastatin  (CRESTOR ) 20 MG tablet Take 20 mg by mouth daily. 02/05/21   [provider]  senna-docusate (SENOKOT-S) 8.6-50 MG tablet Take 1 tablet by mouth 2 (two) times daily as needed for moderate constipation. Patient taking differently: Take 1 tablet by mouth 2 (two) times daily as needed for mild constipation or  moderate constipation. 11/03/20   Love, Sharlet RAMAN, PA-C  ticagrelor  (BRILINTA ) 90 MG TABS tablet Take 1 tablet (90 mg total) by mouth 2 (two) times daily for 25 days. 04/30/24 05/25/24  Perri DELENA Meliton Mickey., MD   Current Facility-Administered Medications  Medication Dose Route Frequency Provider Last Rate Last Admin   acetaminophen  (TYLENOL ) tablet 325-650 mg  325-650 mg Oral Q4H PRN Love, Pamela S, PA-C       [  START ON 05/02/2024] amLODipine  (NORVASC ) tablet 5 mg  5 mg Oral Daily Raulkar, Krutika P, MD       aspirin  EC tablet 81 mg  81 mg Oral Daily Love, Pamela S, PA-C   81 mg at 05/01/24 0732   azaTHIOprine  (IMURAN ) tablet 50 mg  50 mg Oral Daily Love, Pamela S, PA-C   50 mg at 05/01/24 9267   B-complex with vitamin C tablet 1 tablet  1 tablet Oral Daily Raulkar, Krutika P, MD   1 tablet at 05/01/24 1133   calcium  acetate (PHOSLO ) capsule 667 mg  667 mg Oral TID WC Love, Pamela S, PA-C   667 mg at 05/01/24 1133   carvedilol  (COREG ) tablet 25 mg  25 mg Oral BID WC Love, Pamela S, PA-C   25 mg at 05/01/24 9395   Chlorhexidine  Gluconate Cloth 2 % PADS 6 each  6 each Topical [id number] Love, Pamela S, PA-C   6 each at 05/01/24 9394   cholecalciferol  (VITAMIN D3) 25 MCG (1000 UNIT) tablet 1,000 Units  1,000 Units Oral Daily Raulkar, Sven SQUIBB, MD   1,000 Units at 05/01/24 1133   diphenhydrAMINE  (BENADRYL ) capsule 25 mg  25 mg Oral Q6H PRN Love, Pamela S, PA-C       famotidine  (PEPCID ) tablet 20 mg  20 mg Oral QHS Love, Pamela S, PA-C   20 mg at 04/30/24 2126   guaiFENesin -dextromethorphan (ROBITUSSIN DM) 100-10 MG/5ML syrup 5-10 mL  5-10 mL Oral Q6H PRN Love, Pamela S, PA-C       heparin  injection 5,000 Units  5,000 Units Subcutaneous Q8H Love, Pamela S, PA-C   5,000 Units at 05/01/24 9395   hydrALAZINE  (APRESOLINE ) tablet 50 mg  50 mg Oral TID Love, Pamela S, PA-C   50 mg at 05/01/24 0732   HYDROcodone -acetaminophen  (NORCO/VICODIN) 5-325 MG per tablet 1 tablet  1 tablet Oral Q12H PRN Raulkar, Krutika P,  MD   1 tablet at 05/01/24 1133   insulin  aspart (novoLOG ) injection 0-5 Units  0-5 Units Subcutaneous QHS Love, Pamela S, PA-C   3 Units at 04/30/24 2125   insulin  aspart (novoLOG ) injection 0-9 Units  0-9 Units Subcutaneous TID WC Love, Pamela S, PA-C   2 Units at 05/01/24 9394   insulin  glargine (LANTUS ) injection 11 Units  11 Units Subcutaneous Daily Raulkar, Sven SQUIBB, MD       isosorbide  mononitrate (IMDUR ) 24 hr tablet 30 mg  30 mg Oral QHS Love, Pamela S, PA-C   30 mg at 04/30/24 2207   l-methylfolate-B6-B12 (METANX) 3-35-2 MG per tablet 1 tablet  1 tablet Oral Daily Raulkar, Sven SQUIBB, MD   1 tablet at 05/01/24 1133   melatonin tablet 3 mg  3 mg Oral QHS PRN Love, Pamela S, PA-C   3 mg at 04/30/24 2207   milk and molasses enema  1 enema Rectal Daily PRN Love, Pamela S, PA-C       naloxone  (NARCAN ) injection 0.4 mg  0.4 mg Intravenous PRN Love, Pamela S, PA-C       ondansetron  (ZOFRAN ) tablet 4 mg  4 mg Oral Q6H PRN Love, Pamela S, PA-C       Or   ondansetron  (ZOFRAN ) injection 4 mg  4 mg Intravenous Q6H PRN Love, Pamela S, PA-C   4 mg at 05/01/24 9346   Oral care mouth rinse  15 mL Mouth Rinse PRN Love, Pamela S, PA-C       polyethylene glycol (MIRALAX  / GLYCOLAX ) packet 17 g  17 g Oral BID Maurice Sharlet RAMAN, [place]   17 g at 05/01/24 9265   rosuvastatin  (CRESTOR ) tablet 20 mg  20 mg Oral Daily Maurice Sharlet RAMAN, PA-C   20 mg at 05/01/24 0732   simethicone  (MYLICON) chewable tablet 80 mg  80 mg Oral QID PRN Maurice Sharlet RAMAN, PA-C       sorbitol  70 % solution 60 mL  60 mL Oral Daily PRN Love, Pamela S, PA-C       ticagrelor  (BRILINTA ) tablet 90 mg  90 mg Oral BID Maurice Sharlet RAMAN, PA-C   90 mg at 05/01/24 0732   Labs: Basic Metabolic Panel: Recent Labs  Lab 04/25/24 2349 04/27/24 0232 04/29/24 0634 04/30/24 0338  NA 136 133* 134* 133*  K 4.8 4.8 4.5 5.0  CL 98 97* 96* 95*  CO2 29 31 29 30   GLUCOSE 141* 242* 158* 174*  BUN 33* 25* 30* 38*  CREATININE 6.93* 5.95* 6.09* 7.91*  CALCIUM  8.6*  8.5* 9.4 9.0  PHOS 5.7* 4.5  --  4.7*   Liver Function Tests: Recent Labs  Lab 04/25/24 2349 04/30/24 0338  AST  --  13*  ALT  --  8  ALKPHOS  --  38  BILITOT  --  1.0  PROT  --  6.4*  ALBUMIN 3.0* 3.0*   No results for input(s): LIPASE, AMYLASE in the last 168 hours. No results for input(s): AMMONIA in the last 168 hours. CBC: Recent Labs  Lab 04/25/24 2349 04/27/24 0232 04/29/24 0634 04/30/24 0338 04/30/24 2249  WBC 4.0 4.5 4.8 5.3 5.4  NEUTROABS  --   --   --  3.6  --   HGB 10.3* 10.0* 9.9* 9.8* 9.7*  HCT 32.3* 31.0* 30.6* 31.1* 30.2*  MCV 96.7 95.1 95.6 96.9 96.2  PLT 173 176 182 191 191   Cardiac Enzymes: No results for input(s): CKTOTAL, CKMB, CKMBINDEX, TROPONINI in the last 168 hours. CBG: Recent Labs  Lab 04/30/24 1335 04/30/24 1701 04/30/24 2117 05/01/24 0522 05/01/24 1121  GLUCAP 144* 218* 294* 191* 88   Iron Studies: No results for input(s): IRON, TIBC, TRANSFERRIN, FERRITIN in the last 72 hours. Studies/Results: No results found.  ROS: All others negative except those listed in HPI.   Physical Exam: Vitals:   04/30/24 2344 05/01/24 0459 05/01/24 0540 05/01/24 0659  BP: (!) 105/59  (!) 104/54 (!) 92/53  Pulse: 86  77 84  Resp: 17  17 16   Temp: 98.5 F (36.9 C)  98.2 F (36.8 C)   TempSrc: Oral  Oral   SpO2: 100%  100% 100%  Weight:  41.6 kg    Height:         General: WDWN NAD Head: Sclera not icteric  Lungs: CTA bilaterally. No wheeze, rales or rhonchi. Breathing is unlabored. Heart: RRR. No murmur, rubs or gallops.  Abdomen: soft and non-tender Lower extremities:no edema, ischemic changes, or open wounds  Neuro: AAOx3. Moves all extremities spontaneously. Dialysis Access: R AVG (no bleeding on exam today)  Dialysis Orders:  AF MWF 3:30 BFR 350 2K/2Ca AVG 16g No heparin   -EDW 45.4kg -No ESA  -Hectorol  1 mcg q HD  Assessment/Plan: Acute CVA. H/o prior strokes. 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  Management per neuro. Now transferred to CIR. ESRD.  HD MWF. Continue per schedule. On HD.  Prolonged bleeding from AVG previously. No bleeding occurred as of 12/12 but noted another episode of bleeding overnight. Will consider stopping Heparin  SQ here. Not getting  heparin  with HD. Monitor closely. May also need to consider doing a F'gram. Hypertension. Home meds resumed. BP now at goal.  Volume. No evidence of volume overload on exam. Continue UF with HD as able.  Anemia. 9.7, follow and will start ESA if remains <10 Metabolic bone disease: calcium  and phosphorus controlled. Continue Hectorol , Phoslo  binder  T2DM. Insulin  per primary   Charmaine Piety, NP [facility] 05/01/2024, 12:42 PM       [1]  Allergies Allergen Reactions   Ibuprofen Swelling    Swelling to hands, feet and face    Influenza Vaccines Anaphylaxis and Swelling    fever   Pneumococcal Vaccine Anaphylaxis and Swelling    fever   Promethazine Other (See Comments)    hallucinations   Ambien  [Zolpidem ] Other (See Comments)    Unknown reaction   Codeine Itching   Cyclobenzaprine Other (See Comments)    hallucinations    Hydrocodone -Acetaminophen  Itching    Takes now and has not had many issues.   Tape Itching   Tramadol  Hcl Other (See Comments)    hallucinations   Wound Dressing Adhesive Itching    Use paper tape   Oxycodone  Nausea And Vomiting

## 2024-05-01 NOTE — Progress Notes (Signed)
 Trop levels was reviewed, Charge nurse was called , he reports Dan PA reviewed levels . Ms Banbury resting with no complaints.

## 2024-05-01 NOTE — Progress Notes (Signed)
 Inpatient Rehabilitation  Patient information reviewed and entered into eRehab system by [facility]. Karen Kays., CCC/SLP, PPS Coordinator.  Information including medical coding, functional ability and quality indicators will be reviewed and updated through discharge.

## 2024-05-01 NOTE — Evaluation (Signed)
 Speech Language Pathology Assessment and Plan  Patient Details  Name: Hannah [name] MRN: [id number] Date of Birth: [birth year]  SLP Diagnosis: Dysarthria;Cognitive Impairments  Rehab Potential: Good ELOS: 10-12 days   Today's Date: 05/01/2024 SLP Individual Time: 0813-0909 SLP Individual Time Calculation (min): 56 min   Hospital Problem: Principal Problem:   Small vessel stroke [facility])  Past Medical History:  Past Medical History:  Diagnosis Date   CAD S/P PCI x 2 2009   MI 2009 -> PCI x 2 ([place]); 01/2016 Nuc ST Nonischemic with Normal EF   Cerebral infarction (HCC) 07/12/2017   2009 first one, has had a total of 4 stokes- last 2021   Chronic kidney disease    Hemo M-W- F   Diabetes type 2, controlled (HCC)    Hemiparesis affecting right side as late effect of cerebrovascular accident (CVA) (HCC)    Hyperlipidemia    Hypertension    Intractable vomiting with nausea 07/12/2017   Multiple sclerosis 2009   Myocardial infarction [facility]) 2009   Stroke [facility])    Past Surgical History:  Past Surgical History:  Procedure Laterality Date   A/V FISTULAGRAM Right 02/12/2021   Procedure: A/V FISTULAGRAM;  Surgeon: Gretta Lonni PARAS, MD;  [facility]: [facility] INVASIVE CV LAB;  Service: Cardiovascular;  Laterality: Right;   A/V FISTULAGRAM Right 05/20/2022   Procedure: A/V Fistulagram;  Surgeon: Gretta Lonni PARAS, MD;  Location: [facility] INVASIVE CV LAB;  Service: Cardiovascular;  Laterality: Right;   A/V FISTULAGRAM N/A 05/05/2023   Procedure: A/V Fistulagram;  Surgeon: Melia Lynwood ORN, MD;  Location: [facility] INVASIVE CV LAB;  Service: Cardiovascular;  Laterality: N/A;   A/V SHUNT INTERVENTION N/A 08/16/2023   Procedure: A/V SHUNT INTERVENTION;  Surgeon: Tobie Gordy POUR, MD;  Location: [facility] INVASIVE CV LAB;  Service: Cardiovascular;  Laterality: N/A;   A/V SHUNTOGRAM Right 09/26/2023   Procedure: A/V SHUNTOGRAM;  Surgeon: Pearline Norman RAMAN, MD;  Location: [facility] INVASIVE CV LAB;  Service: Cardiovascular;   Laterality: Right;   APPENDECTOMY     AV FISTULA PLACEMENT Right 10/02/2020   Procedure: RIGHT ARTERIOVENOUS (AV) FISTULA CREATION;  Surgeon: Sheree Penne Lonni, MD;  Location: [facility] OR;  Service: Vascular;  Laterality: Right;   AV FISTULA PLACEMENT Right 05/28/2022   Procedure: RIGHT ARM ARTERIOVENOUS (AV) FISTULA REVISION WITH GORETEX GRAFT;  Surgeon: Gretta Lonni PARAS, MD;  Location: [facility] OR;  Service: Vascular;  Laterality: Right;   BASCILIC VEIN TRANSPOSITION Right 11/24/2020   Procedure: SECOND STAGE RIGHT ARM BASCILIC VEIN TRANSPOSITION;  Surgeon: Gretta Lonni PARAS, MD;  Location: [facility] OR;  Service: Vascular;  Laterality: Right;   CYSTOSTOMY W/ BLADDER BIOPSY     INSERTION OF DIALYSIS CATHETER Right 05/28/2022   Procedure: INSERTION OF TUNNELED DIALYSIS CATHETER IN THE RIGHT INTERNAL JUGULAR;  Surgeon: Gretta Lonni PARAS, MD;  Location: [facility] OR;  Service: Vascular;  Laterality: Right;   IR FLUORO GUIDE CV LINE RIGHT  09/29/2020   IR US  GUIDE VASC ACCESS RIGHT  09/29/2020   NM MYOVIEW  LTD  12/25/2015   [place], [place] - NORMAL /NEGATIVE for ischemia. Normal LV Fxn   OVARIAN CYST SURGERY     PERCUTANEOUS CORONARY STENT INTERVENTION (PCI-S)  2009   [place], [place] - 2 stents in settng of MI -- unknown details   PERIPHERAL VASCULAR BALLOON ANGIOPLASTY Right 02/12/2021   Procedure: PERIPHERAL VASCULAR BALLOON ANGIOPLASTY;  Surgeon: Gretta Lonni PARAS, MD;  [facility];  Service: Cardiovascular;  Laterality: Right;  UPPER ARM FISTULA   PERIPHERAL VASCULAR  BALLOON ANGIOPLASTY  05/20/2022   Procedure: PERIPHERAL VASCULAR BALLOON ANGIOPLASTY;  Surgeon: Gretta Lonni PARAS, MD;  [facility];  Service: Cardiovascular;;  rt arm fistula site   PERIPHERAL VASCULAR BALLOON ANGIOPLASTY Right 01/28/2022   Procedure: PERIPHERAL VASCULAR BALLOON ANGIOPLASTY;  Surgeon: Gretta Lonni PARAS, MD;  [facility];  Service: Cardiovascular;  Laterality: Right;   Arm fistula   PERIPHERAL VASCULAR BALLOON ANGIOPLASTY  05/05/2023   Procedure: PERIPHERAL VASCULAR BALLOON ANGIOPLASTY;  Surgeon: Melia Lynwood ORN, MD;  [facility] INVAS[facility] CV LAB;  Service: Cardiovascular;;  RUA AVF   TRANSTHORACIC ECHOCARDIOGRAM  07/11/2017   a) 12/2015, [facility], [place]): Moderate Conc LVH w/ sigmoid septum.  No LVOT obstruction.  Hyperdynamic LV fxn - EF 65 to 70%.  GR 1 DD & high LAP/LVEDP.  AoV Sclerosis - No AS;; b) 06/2017: [facility]): Normal LV Fxn - EF 60%.  Abnormal septal motion.  Mild LVH mild MAC.  Negative bubble study (for CVA)   TRANSTHORACIC ECHOCARDIOGRAM  12/2020   a) 05/2020: EF 60-65%. No RWMA. Gr2DD. Sm posterior pericardial effusion. No AS.; b) 09/2020: EF 70-75%.  Hyperdynamic.  No RWMA. ? D Fxn/LAP.  Mildly elevated PAP.  Pericardial effusion.  Mild ao V sclerosis cyst.; c) 12/2020 (Limited): EF 65-70%. No RWMA. Gr1-2 DD. Mild Conc LVH- high LAP. Nl PAP. Circumferential effusion w/o tamponade. AoV Sclerosis - no AS. Nl RAP.   ULTRASOUND GUIDANCE FOR VASCULAR ACCESS Right 05/28/2022   Procedure: ULTRASOUND GUIDANCE FOR VASCULAR ACCESS;  Surgeon: Gretta Lonni PARAS, MD;  Location: [facility] OR;  Service: Vascular;  Laterality: Right;   VENOUS ANGIOPLASTY Right 08/16/2023   Procedure: VENOUS ANGIOPLASTY;  Surgeon: Tobie Gordy POUR, MD;  Location: [facility] INVASIVE CV LAB;  Service: Cardiovascular;  Laterality: Right;  70% Innominate; 70% Outflow Basilic    Assessment / Plan / Recommendation Clinical Impression   Hannah [name] is a 58 year old RH-female with history of CVA with chronic right sided weakness, CAD s/p PCI, HTN, MS, chorioretinitis, chronic LBP, T2DM- HD MWF wjp was admitted on 04/23/24 with onset peripheral vision loss, left facial weakness with left sided weakness and aphasia. MRI brain done revealing acute infarct right dorsal midbrain and multiple scattered chronic microhemorrhages particularly within the thalami and brain stem.  PT/OT/ST has been  working with patient who requires mod to max assist with ADLs with cues to compensated for left lean, dysarthria is at baseline and requires mod assist for mobility and able to take a couple of steps. At baseline she was independent at wheelchair level and was able to walk a few steps with use of walker. CIR recommended due to functional decline.    Bedside Swallow Evaluation: Patient presents with clinical swallowing function that appears [facility]. Daughter at bedside indicates that since acute stroke event, patient has demonstrated intermittent choking/coughing with meals, though unable to indicate exact frequency. Oral mechanism exam was positive for limited range of lingual motion but otherwise intact re: symmetry and strength. Patient does have residual R sided weakness from previous stroke, though this had not been impacting swallowing at baseline per family reports. SLP administered thin liquids and regular solids. She exhibited no overt s/sx of penetration/aspiration across all items presented through thorough trials. Recommend continuation of regular/thin liquid diet. Administer medications whole with thin liquids. No direct SLP needs identified for swallowing   Cognitive-Linguistic Evaluation: Patient presents with mild changes to cognitive-linguistic function across domains of motor speech, delayed recall, problem solving, and working memory.  Receptive/expressive language appears intact. Daughter at bedside reports that patient had residual deficits from prior stroke but that with acute stroke presentation, both speech and thinking skills are mildly delayed in comparison to baseline. Patient is 100% intelligible, but articulation is halting and imprecise, c/b consistent errors. Patient scored 17/30 on SLUMS examination and endorses the tests would likely have been easier prior to acute stroke event.   Patient would benefit from ongoing SLP services targeting aforementioned deficits. Patient was left in  lowered bed with call bell in reach and bed alarm set. SLP will continue to target goals per plan of care.     Skilled Therapeutic Interventions          SLP conducted skilled evaluation session to assess swallowing and cognitive-linguistic function. Utilized bedside swallow evaluation, SLUMS, and patient and/or family interview. Full results above.   SLP Assessment  Patient will need skilled Speech Lanaguage Pathology Services during CIR admission    Recommendations  SLP Diet Recommendations: Thin;Age appropriate regular solids Liquid Administration via: Cup;Straw Medication Administration: Whole meds with liquid Supervision: Patient able to self feed Postural Changes and/or Swallow Maneuvers: Seated upright 90 degrees Oral Care Recommendations: Oral care BID Patient destination: Home Follow up Recommendations: Outpatient SLP;24 hour supervision/assistance Equipment Recommended: None recommended by SLP    SLP Frequency 1 to 3 out of 7 days   SLP Duration  SLP Intensity  SLP Treatment/Interventions 10-12 days  Minumum of 1-2 x/day, 30 to 90 minutes  Cognitive remediation/compensation;Speech/Language facilitation;Cueing hierarchy;Functional tasks;Therapeutic Activities;Internal/external aids;Patient/family education    Pain Pain Assessment Pain Scale: 0-10 Pain Score: 10-Worst pain ever Pain Location: Abdomen Pain Intervention(s): MD notified (Comment);RN made aware;Repositioned Multiple Pain Sites: No  Prior Functioning Cognitive/Linguistic Baseline: Baseline deficits Baseline deficit details: daughter reports baseline cognitive deficits d/t previous stroke, and daughter completes all iADLs at baseline Type of Home: Apartment  Lives With: Daughter;Son Available Help at Discharge: Family;Available 24 hours/day Education: some college Vocation: Unemployed  SLP Evaluation Cognition Overall Cognitive Status: Impaired/Different from baseline Arousal/Alertness:  Awake/alert Orientation Level: Oriented X4 Year: 2025 Month: December Day of Week: Correct Attention: Sustained Sustained Attention: Appears intact Memory: Impaired Memory Impairment: Retrieval deficit;Storage deficit Awareness: Impaired Awareness Impairment: Emergent impairment;Anticipatory impairment Problem Solving: Impaired Problem Solving Impairment: Verbal basic;Functional basic Executive Function: Sequencing;Organizing Sequencing: Impaired Sequencing Impairment: Verbal basic;Functional basic Organizing: Impaired Organizing Impairment: Verbal basic;Functional basic Safety/Judgment: Appears intact  Comprehension Auditory Comprehension Overall Auditory Comprehension: Appears within functional limits for tasks assessed Expression Expression Primary Mode of Expression: Verbal Verbal Expression Overall Verbal Expression: Appears within functional limits for tasks assessed Initiation: No impairment Level of Generative/Spontaneous Verbalization: Music Therapist Expression Dominant Hand: Left Oral Motor Oral Motor/Sensory Function Overall Oral Motor/Sensory Function: Mild impairment Facial ROM: Reduced right;Reduced left Facial Symmetry: Within Functional Limits Facial Strength: Within Functional Limits Lingual ROM: Reduced right;Reduced left Lingual Strength: Within Functional Limits Velum: Within Functional Limits Mandible: Within Functional Limits Motor Speech Overall Motor Speech: Impaired at baseline Respiration: Within functional limits Phonation: Low vocal intensity Resonance: Within functional limits Articulation: Impaired Level of Impairment: Sentence Intelligibility: Intelligible Motor Planning: Within functional limits Motor Speech Errors: Consistent;Aware Interfering Components: Premorbid status Effective Techniques: Slow rate;Increased vocal intensity;Over-articulate  Care Tool Care Tool Cognition Ability to hear (with hearing aid or hearing  appliances if normally used Ability to hear (with hearing aid or hearing appliances if normally used): 0. Adequate - no difficulty in normal conservation, social interaction, listening to TV   Expression of Ideas and Wants Expression of Ideas  and Wants: 4. Without difficulty (complex and basic) - expresses complex messages without difficulty and with speech that is clear and easy to understand   Understanding Verbal and Non-Verbal Content Understanding Verbal and Non-Verbal Content: 4. Understands (complex and basic) - clear comprehension without cues or repetitions  Memory/Recall Ability Memory/Recall Ability : Current season;That he or she is in a hospital/hospital unit   Intelligibility: Intelligible  Bedside Swallowing Assessment General Date of Onset: 04/23/24 Previous Swallow Assessment: n/a Diet Prior to this Study: Regular;Thin liquids (Level 0) Temperature Spikes Noted: No Respiratory Status: Room air History of Recent Intubation: No Behavior/Cognition: Alert;Cooperative;Pleasant mood Oral Cavity - Dentition: Adequate natural dentition Self-Feeding Abilities: Able to feed self Patient Positioning: Upright in bed Baseline Vocal Quality: Low vocal intensity Volitional Cough: Weak Volitional Swallow: Able to elicit  Oral Care Assessment Oral Assessment  (WDL): Within Defined Limits Lips: Symmetrical;Pink Teeth: Intact Tongue: Pink;Moist Mucous Membrane(s): Moist;Pink Saliva: Moist, saliva free flowing Level of Consciousness: Alert Is patient on any of following O2 devices?: None of the above Nutritional status: No high risk factors Oral Assessment Risk : Low Risk Ice Chips Ice chips: Not tested Thin Liquid Thin Liquid: Within functional limits Presentation: Straw;Self Fed Nectar Thick Nectar Thick Liquid: Not tested Honey Thick Honey Thick Liquid: Not tested Puree Puree: Not tested Solid Solid: Within functional limits Presentation: Self Fed BSE Assessment Risk  for Aspiration Impact on safety and function: Mild aspiration risk Other Related Risk Factors: Previous CVA;Deconditioning  Short Term Goals: Week 1: SLP Short Term Goal 1 (Week 1): Patient will solve mildly complex structured problems with min assist. SLP Short Term Goal 2 (Week 1): Patient will utilize internal/external memory aids to recall daily information with 80% accuracy given mod assist. SLP Short Term Goal 3 (Week 1): Patient will utilize SLOP speech intelligibility strategies at the phrase level 60% of the time with min assist.  Refer to Care Plan for Long Term Goals  Recommendations for other services: None   Discharge Criteria: Patient will be discharged from SLP if patient refuses treatment 3 consecutive times without medical reason, if treatment goals not met, if there is a change in medical status, if patient makes no progress towards goals or if patient is discharged from hospital.  The above assessment, treatment plan, treatment alternatives and goals were discussed and mutually agreed upon: by patient and by family  Gerrit Rafalski, M.A., CCC-SLP  Laylynn Campanella A Francia Verry 05/01/2024, 11:43 AM

## 2024-05-01 NOTE — Plan of Care (Signed)
°  Problem: RH Balance Goal: LTG: Patient will maintain dynamic sitting balance (OT) Description: LTG:  Patient will maintain dynamic sitting balance with assistance during activities of daily living (OT) Flowsheets (Taken 05/01/2024 1239) LTG: Pt will maintain dynamic sitting balance during ADLs with: Supervision/Verbal cueing Goal: LTG Patient will maintain dynamic standing with ADLs (OT) Description: LTG:  Patient will maintain dynamic standing balance with assist during activities of daily living (OT)  Flowsheets (Taken 05/01/2024 1239) LTG: Pt will maintain dynamic standing balance during ADLs with: Supervision/Verbal cueing   Problem: Sit to Stand Goal: LTG:  Patient will perform sit to stand in prep for activites of daily living with assistance level (OT) Description: LTG:  Patient will perform sit to stand in prep for activites of daily living with assistance level (OT) Flowsheets (Taken 05/01/2024 1239) LTG: PT will perform sit to stand in prep for activites of daily living with assistance level: Supervision/Verbal cueing   Problem: RH Eating Goal: LTG Patient will perform eating w/assist, cues/equip (OT) Description: LTG: Patient will perform eating with assist, with/without cues using equipment (OT) Flowsheets (Taken 05/01/2024 1239) LTG: Pt will perform eating with assistance level of: Set up assist    Problem: RH Grooming Goal: LTG Patient will perform grooming w/assist,cues/equip (OT) Description: LTG: Patient will perform grooming with assist, with/without cues using equipment (OT) Flowsheets (Taken 05/01/2024 1239) LTG: Pt will perform grooming with assistance level of: Supervision/Verbal cueing   Problem: RH Bathing Goal: LTG Patient will bathe all body parts with assist levels (OT) Description: LTG: Patient will bathe all body parts with assist levels (OT) Flowsheets (Taken 05/01/2024 1239) LTG: Pt will perform bathing with assistance level/cueing: Minimal Assistance  - Patient > 75%   Problem: RH Dressing Goal: LTG Patient will perform upper body dressing (OT) Description: LTG Patient will perform upper body dressing with assist, with/without cues (OT). Flowsheets (Taken 05/01/2024 1239) LTG: Pt will perform upper body dressing with assistance level of: Supervision/Verbal cueing Goal: LTG Patient will perform lower body dressing w/assist (OT) Description: LTG: Patient will perform lower body dressing with assist, with/without cues in positioning using equipment (OT) Flowsheets (Taken 05/01/2024 1239) LTG: Pt will perform lower body dressing with assistance level of: Minimal Assistance - Patient > 75%   Problem: RH Toileting Goal: LTG Patient will perform toileting task (3/3 steps) with assistance level (OT) Description: LTG: Patient will perform toileting task (3/3 steps) with assistance level (OT)  Flowsheets (Taken 05/01/2024 1239) LTG: Pt will perform toileting task (3/3 steps) with assistance level: Minimal Assistance - Patient > 75%   Problem: RH Toilet Transfers Goal: LTG Patient will perform toilet transfers w/assist (OT) Description: LTG: Patient will perform toilet transfers with assist, with/without cues using equipment (OT) Flowsheets (Taken 05/01/2024 1239) LTG: Pt will perform toilet transfers with assistance level of: Supervision/Verbal cueing   Problem: RH Tub/Shower Transfers Goal: LTG Patient will perform tub/shower transfers w/assist (OT) Description: LTG: Patient will perform tub/shower transfers with assist, with/without cues using equipment (OT) Flowsheets (Taken 05/01/2024 1239) LTG: Pt will perform tub/shower stall transfers with assistance level of: Supervision/Verbal cueing

## 2024-05-01 NOTE — Progress Notes (Signed)
 Inpatient Rehabilitation Center Individual Statement of Services  Patient Name:  Hannah [name]  Date:  05/01/2024  Welcome to the Inpatient Rehabilitation Center.  Our goal is to provide you with an individualized program based on your diagnosis and situation, designed to meet your specific needs.  With this comprehensive rehabilitation program, you will be expected to participate in at least 3 hours of rehabilitation therapies Monday-Friday, with modified therapy programming on the weekends.  Your rehabilitation program will include the following services:  Physical Therapy (PT), Occupational Therapy (OT), Speech Therapy (ST), 24 hour per day rehabilitation nursing, Therapeutic Recreaction (TR), Neuropsychology, Care Coordinator, Rehabilitation Medicine, Nutrition Services, and Pharmacy Services  Weekly team conferences will be held on Wednesday to discuss your progress.  Your Inpatient Rehabilitation Care Coordinator will talk with you frequently to get your input and to update you on team discussions.  Team conferences with you and your family in attendance may also be held.  Expected length of stay: 10-14 days  Overall anticipated outcome: Supervision/Verbal cueing   Depending on your progress and recovery, your program may change. Your Inpatient Rehabilitation Care Coordinator will coordinate services and will keep you informed of any changes. Your Inpatient Rehabilitation Care Coordinator's name and contact numbers are listed  below.  The following services may also be recommended but are not provided by the Inpatient Rehabilitation Center:  Driving Evaluations Home Health Rehabiltiation Services Outpatient Rehabilitation Services Vocational Rehabilitation   Arrangements will be made to provide these services after discharge if needed.  Arrangements include referral to agencies that provide these services.  Your insurance has been verified to be: [employer] MEDICARE / [facility]  MEDICARE Your primary doctor is: Madden, Evan J, MD   Pertinent information will be shared with your doctor and your insurance company.  Inpatient Rehabilitation Care Coordinator:  [name] [name] SIERRAS [phone] or ELIGAH BRINKS  Information discussed with and copy given to patient by: [name], 05/01/2024, 2:18 PM

## 2024-05-02 DIAGNOSIS — I639 Cerebral infarction, unspecified: Secondary | ICD-10-CM | POA: Diagnosis not present

## 2024-05-02 LAB — GLUCOSE, CAPILLARY
Glucose-Capillary: 161 mg/dL — ABNORMAL HIGH (ref 70–99)
Glucose-Capillary: 205 mg/dL — ABNORMAL HIGH (ref 70–99)
Glucose-Capillary: 325 mg/dL — ABNORMAL HIGH (ref 70–99)

## 2024-05-02 LAB — CBC WITH DIFFERENTIAL/PLATELET
Abs Immature Granulocytes: 0.01 K/uL (ref 0.00–0.07)
Basophils Absolute: 0.1 K/uL (ref 0.0–0.1)
Basophils Relative: 1 %
Eosinophils Absolute: 0.1 K/uL (ref 0.0–0.5)
Eosinophils Relative: 2 %
HCT: 29.3 % — ABNORMAL LOW (ref 36.0–46.0)
Hemoglobin: 9.4 g/dL — ABNORMAL LOW (ref 12.0–15.0)
Immature Granulocytes: 0 %
Lymphocytes Relative: 18 %
Lymphs Abs: 1 K/uL (ref 0.7–4.0)
MCH: 30.9 pg (ref 26.0–34.0)
MCHC: 32.1 g/dL (ref 30.0–36.0)
MCV: 96.4 fL (ref 80.0–100.0)
Monocytes Absolute: 0.3 K/uL (ref 0.1–1.0)
Monocytes Relative: 6 %
Neutro Abs: 4.2 K/uL (ref 1.7–7.7)
Neutrophils Relative %: 73 %
Platelets: 190 K/uL (ref 150–400)
RBC: 3.04 MIL/uL — ABNORMAL LOW (ref 3.87–5.11)
RDW: 15.9 % — ABNORMAL HIGH (ref 11.5–15.5)
WBC: 5.7 K/uL (ref 4.0–10.5)
nRBC: 0 % (ref 0.0–0.2)

## 2024-05-02 MED ORDER — ALTEPLASE 2 MG IJ SOLR: 2.0000 mg | Freq: Once | INTRAMUSCULAR | Status: DC | PRN

## 2024-05-02 MED ORDER — AMLODIPINE BESYLATE 2.5 MG PO TABS
2.5000 mg | ORAL_TABLET | Freq: Every day | ORAL | Status: DC
  Administered 2024-05-03: 10:00:00 2.5 mg via ORAL
  Filled 2024-05-02 (×2): qty 1

## 2024-05-02 MED ORDER — HEPARIN SODIUM (PORCINE) 1000 UNIT/ML DIALYSIS: 1000.0000 [IU] | INTRAMUSCULAR | Status: DC | PRN

## 2024-05-02 MED ORDER — BISACODYL 10 MG RE SUPP
10.0000 mg | Freq: Every day | RECTAL | Status: DC | PRN
  Administered 2024-05-03 – 2024-05-11 (×3): 10 mg via RECTAL
  Filled 2024-05-02 (×3): qty 1

## 2024-05-02 MED ORDER — LIDOCAINE-PRILOCAINE 2.5-2.5 % EX CREA: 1.0000 | TOPICAL_CREAM | CUTANEOUS | Status: DC | PRN

## 2024-05-02 MED ORDER — PENTAFLUOROPROP-TETRAFLUOROETH EX AERO: 1.0000 | INHALATION_SPRAY | CUTANEOUS | Status: DC | PRN

## 2024-05-02 MED ORDER — LIDOCAINE HCL (PF) 1 % IJ SOLN: 5.0000 mL | INTRAMUSCULAR | Status: DC | PRN

## 2024-05-02 NOTE — Progress Notes (Signed)
 Occupational Therapy Note  Patient Details  Name: Hannah [name] MRN: [id number] Date of Birth: 1966-04-26   Occupational Therapist participated in the interdisciplinary team conference, providing clinical information regarding the patient's current status, treatment goals, and weekly focus, including any barriers that need to be addressed. Please see the Inpatient Rehabilitation Team Conference and Plan of Care Update for further details.  Hannah [name] M 05/02/2024, 11:33 AM

## 2024-05-02 NOTE — Progress Notes (Signed)
 Physical Therapy Note  Patient Details  Name: Hannah [name] MRN: [id number] Date of Birth: [birth year] Today's Date: 05/02/2024   Physical Therapist participated in the interdisciplinary team conference, providing clinical information regarding the patients current status, treatment goals, and weekly focus, including any barriers that need to be addressed. Please see the Inpatient Rehabilitation Team Conference and Plan of Care Update for further details.   Therisa HERO Zaunegger Therisa Stains PT, DPT 05/02/2024, 11:38 AM

## 2024-05-02 NOTE — Progress Notes (Signed)
 Physical Therapy Session Note  Patient Details  Name: Hannah [name] MRN: [id number] Date of Birth: January 10, 1966  Today's Date: 05/02/2024 PT Individual Time: [phone] and [id number] PT Individual Time Calculation (min): 53 min  and 55 min Today's Date: 05/02/2024 PT Missed Time: 7 Minutes Missed Time Reason: Patient fatigue  Short Term Goals: Week 1:  PT Short Term Goal 1 (Week 1): pt will transfer supine<>sitting EOB with min A consistantly PT Short Term Goal 2 (Week 1): pt will transfer sit<>stand with LRAD and min A consistantly PT Short Term Goal 3 (Week 1): pt will perform bed<>chair transfer with LRAD and min A consistantly  Skilled Therapeutic Interventions/Progress Updates:   Treatment Session 1 Received pt semi-reclined in bed with daughter at bedside. Pt agreeable to PT treatment and reported pain in R side initially but resolved once repositioning and getting OOB. Pt also c/o constipation and fatigue - currently drinking Miralax . Session with emphasis on functional mobility/transfers, dressing, generalized strengthening and endurance, dynamic standing balance/coordination, and NMR. Pt transferred semi-reclined<>sitting R EOB with HOB elevated and use of bedrails with CGA and increased time w/ heavy reliance on bedrail. Pt able to scoot hips to EOB with CGA using bedrail this morning. Removed nightgown and donned clean pull over shirt, pants, socks, and shoes seated with max A.   Stood from EOB with RW and mod A and required mod A to pull pants over hips. Pt opted for SB transfer to [facility] due to fatigue and performed with mod A with assist to place/remove board and cues for technique. Pt sat in WC at sink and brushed teeth/washed face with supervision, then transported to/from room in [facility] dependently. Pt performed seated BLE strengthening on Kinetron at 20 cm/sec for 1 minute x 4 trials with emphasis on glute/quad strength. Returned to room and concluded session with pt sitting in [facility] with  all needs within reach and daughter at bedside.    Treatment Session 2 Received pt sitting in WC, pt agreeable to PT treatment, and denied any pain during session. Session with emphasis on functional mobility/transfers, generalized strengthening and endurance, dynamic standing balance/coordination, and NMR. MD arrived for morning rounds, then pt transported to/from room in [facility] dependently for time management purposes.   Stood in hallway with L handrail and mod A x 2 trials and ambulated 16ft x 1 and 57ft x 1 with L handrail and R HHA with mod A and +2 for WC follow. Pt required cues to widen BOS, extend knees, and to increase R foot clearance. Stood at table in dayroom with max A x 2 trials and performed alternating marches 2x10 bilaterally with mod A for balance and posterior bias. Attempting standing hip abduction, however pt only able to perform x1 on LLE with mod A for balance before fatiguing out and collapsing into knee flexion with posterior bias. Transitioned to seated knee extension x10 bilaterally, seated hip flexion x10 bilaterally, and hip adduction isometrics x10. Returned to room and transferred back to bed for RN to administer suppository. Transferred WC<>bed via stand<>pivot without AD and mod A and transitioned into supine with min A. Concluded session with pt semi-reclined in bed, needs within reach, and bed alarm on.   Therapy Documentation Precautions:  Precautions Precautions: Fall Precaution/Restrictions Comments: R hemiparesis Restrictions Weight Bearing Restrictions Per Provider Order: No  Therapy/Group: Individual Therapy Therisa HERO Zaunegger Therisa Stains PT, DPT 05/02/2024, 6:54 AM

## 2024-05-02 NOTE — Plan of Care (Signed)
°  Problem: RH BOWEL ELIMINATION Goal: RH STG MANAGE BOWEL W/MEDICATION W/ASSISTANCE Description: STG Manage Bowel with Medication with mod I Assistance. Outcome: Progressing

## 2024-05-02 NOTE — Progress Notes (Signed)
 Pt. Awake and oriented with no complaints Consent verified and on file Started with no complications  UF removal: 0 Tx duration: 3.5 hours  Access used: Right AVG Access issue: None  Tx completed and tolerated. Pressure dressing applied Endorsed to floor nurse Transported to room  Estanislao Auther Shope, RN [facility]

## 2024-05-02 NOTE — Patient Care Conference (Signed)
 Inpatient RehabilitationTeam Conference and Plan of Care Update Date: 05/02/2024   Time: 11:30 AM    Patient Name: Hannah [name]      Medical Record Number: [id number]  Date of Birth: 1966/03/17 Sex: Female         Room/Bed: 4W20C/4W20C-01 Payor Info: Payor: [employer] MEDICARE / Plan: [facility] MEDICARE / Product Type: *No Product type* /    Admit Date/Time:  04/30/2024  2:12 PM  Primary Diagnosis:  Small vessel stroke [facility])  Hospital Problems: Principal Problem:   Small vessel stroke [facility])    Expected Discharge Date: Expected Discharge Date: 05/13/24  Team Members Present: Physician leading conference: Dr. Sven Elks Social Worker Present: Graeme Jude, LCSW Nurse Present: Barnie Ronde, RN PT Present: Therisa Stains, PT OT Present: Monica Peacock, OT SLP Present: Recardo Mole, SLP     Current Status/Progress Goal Weekly Team Focus  Bowel/Bladder   (P) Last recorded bowel movement  was 04/25/24. Pt is receiving Miralax  2 times daily.   (P) To have a bowel movement soon.   (P) Start having bowel movements regularly    Swallow/Nutrition/ Hydration   regular/thin liquids - daughter reports intermittent coughing with meals, but none observed during evaluation   modI  peripheral assessment of diet tolerance to ensure ongoing safety    ADL's   MAX LB, sit <> stands MOD, decreased processing, slow movements, stand pivot MOD with RW and extended time   MIN A   Improve participation and safety with BADL    Mobility   bed mobility mod/max A, transfers with/without RW mod/max A, gait 57ft with L handrail and mod A +2   CGA/min A  barriers: poor vision, new R hemi w/ old L hemi, required assist at baseline, decreased balance/coordination, global weakness/deconditioning    Communication   mild dysarthria - only tracely decreased from baseline (baseline dysarthria present from prior stroke)   minA   introducted and integration of SLOP speech intelligibility  strategies at the phrase and sentence level    Safety/Cognition/ Behavioral Observations  mildly delayed processing speed compared to baseline per daughter, though baseline deficits present from prior stroke - requires min assist for problem solving and memory   supervision-minA   problem solving, memory strategies and implementation (has difficulty writing, so limited in external memory aid creation e.g. memory book)    Pain   No complains of pain   Continue to keep pain under control        Skin   Skin is intact   Continue to maintain intact skin  Monitor buttocks      Discharge Planning:  Plans to go home with daughter, [name] (works during the day) and son [name] (works night) roatating for care. Has specialty wheelchair and other DME. Awaiting follow-up therapy recommendations.   Team Discussion: Patient admitted post small vessel stroke with history of CVA and left hemiparesis.  Progress limited by dysarthria, global weakness, slow mobility and decreased processing with visual deficits.  Evaluations delayed due to chest pain with elevated Troponin level upon admission and issues with nausea/constipation and right lower extremity pain.   Patient on target to meet rehab goals: yes, currently needs mod assist for transfers. Needs min - mod assist for mobility. Requires mod assist for sit - stand and max assist for lower body care.  Able to ambulate 15-25' using a left handrail with mod assist. Goals for discharge set for min - CGA overall.  *See Care Plan and progress notes for long and short-term  goals.   Revisions to Treatment Plan:  N/a   Teaching Needs: Safety, medications, transfers, toileting, etc.   Current Barriers to Discharge: Decreased caregiver support and Home enviroment access/layout  Possible Resolutions to Barriers: Family education     Medical Summary Current Status: underweight, nausea, constipation, abdominal pain, hypotension, CVA, type 2 diabetes,  HLD  Barriers to Discharge: Medical stability  Barriers to Discharge Comments: underweight, nausea, constipation, abdominal pain, hypotension, CVA, type 2 diabetes, HLD Possible Resolutions to [employer]: provide dietary education, prune juice, suppository today, decresase amlodipine  to 2.5mg  daily, continue coreg , continue aspirin , conitnue scheduled lantus  11 U daily, continue crestor    Continued Need for Acute Rehabilitation Level of Care: The patient requires daily medical management by a physician with specialized training in physical medicine and rehabilitation for the following reasons: Direction of a multidisciplinary physical rehabilitation program to maximize functional independence : Yes Medical management of patient stability for increased activity during participation in an intensive rehabilitation regime.: Yes Analysis of laboratory values and/or radiology reports with any subsequent need for medication adjustment and/or medical intervention. : Yes   I attest that I was present, lead the team conference, and concur with the assessment and plan of the team.   Fredericka Sober B 05/02/2024, 12:46 PM

## 2024-05-02 NOTE — Progress Notes (Signed)
 Occupational Therapy Session Note  Patient Details  Name: Hannah [name] MRN: [id number] Date of Birth: [birth year]  Today's Date: 05/02/2024 OT Individual Time: 9154-9066 OT Individual Time Calculation (min): 48 min    Short Term Goals: Week 1:  OT Short Term Goal 1 (Week 1): Pt will perform toilet transfers MIN A OT Short Term Goal 2 (Week 1): Pt will perform LB dressing MOD A OT Short Term Goal 3 (Week 1): Pt will improve sitting at EOB for 10 minutes to perform ADL task  Skilled Therapeutic Interventions/Progress Updates:      Therapy Documentation Precautions:  Precautions Precautions: Fall Precaution/Restrictions Comments: R hemiparesis Restrictions Weight Bearing Restrictions Per Provider Order: No General: Pt seated in W/C upon OT arrival, agreeable to OT. Daughter [name] initially present to introduce herself.   Pain: reports stomach pain d/t needing to have BM although saying not ready during session  Exercises:  Pt session focus on cognitive and NMRE such as sequencing, short term memory, motor planning, coordination, etc. Pt participated in matching activity with playing cards.OT challenging pt to retrieve cards in pile with RUE working on control and [facility] then being able to reach out of BOS and across misling in order to place cards with match. Pt requiring increased time for processing and able to complete with 100% accuracy. Pt requiring AAROM only 1 time for anterior reach.  Pt seated in W/C at end of session with W/C alarm donned, call light within reach and 4Ps assessed.    Therapy/Group: Individual Therapy  Camie Hoe, OTD, OTR/L 05/02/2024, 10:08 AM

## 2024-05-02 NOTE — Progress Notes (Signed)
 Speech Language Pathology Daily Session Note  Patient Details  Name: Hannah [name] MRN: [id number] Date of Birth: 23-Aug-1965  Today's Date: 05/02/2024 SLP Individual Time: 9052-8967 SLP Individual Time Calculation (min): 45 min  Short Term Goals: Week 1: SLP Short Term Goal 1 (Week 1): Patient will solve mildly complex structured problems with min assist. SLP Short Term Goal 2 (Week 1): Patient will utilize internal/external memory aids to recall daily information with 80% accuracy given mod assist. SLP Short Term Goal 3 (Week 1): Patient will utilize SLOP speech intelligibility strategies at the phrase level 60% of the time with min assist.  Skilled Therapeutic Interventions:   Patient was seen in am to address cognitive re- training and speech intelligibility. Pt was alert and seated upright in WC upon SLP arrival. She recalled participation in PT and OT sessions prior to this with sup A. Pt able to verbalize her recent medical hx along with PLOF with min A. She demonstrated appropriate awareness and orientation to situation reporting being in this CIR before and reason for current admission. SLP introduced SLOP speech intelligibility strategies and provided examples of utilization. SLP also guided pt in instruction and education on intelligibility with familiar vs unfamiliar listeners. SLP guided pt in an interview style questionnaire with both biographical and personal questions. Pt communicated at the phrase level with 90% intelligibility and min intermittent cues for use of strategies. Pt may benefit from one additional session addressing intelligibility and cognition before SLP signoff. Direct handoff to PT at conclusion of session.   Pain Pain Assessment Pain Scale: 0-10 Pain Score: 0-No pain  Therapy/Group: Individual Therapy  Joane GORMAN Fuss 05/02/2024, 11:22 AM

## 2024-05-02 NOTE — Progress Notes (Signed)
°   05/02/24 1821  Vitals  Temp 98.9 F (37.2 C)  Pulse Rate 95  Resp 14  BP (!) 156/62  SpO2 98 %  O2 Device Room Air  Weight 44.4 kg  Oxygen Therapy  Patient Activity (if Appropriate) In bed  Pulse Oximetry Type Continuous  Oximetry Probe Site Changed No  Post Treatment  Dialyzer Clearance Lightly streaked  Liters Processed 73.5  Fluid Removed (mL) 0 mL  Tolerated HD Treatment Yes  Post-Hemodialysis Comments Pt. tolerated tx well  AVG/AVF Arterial Site Held (minutes) 5 minutes  AVG/AVF Venous Site Held (minutes) 5 minutes

## 2024-05-02 NOTE — Progress Notes (Signed)
 PROGRESS NOTE   Subjective/Complaints: C/o emesis yesterday- has not yet had BM, Hannah [name] PT has brought her prune juice, she is drinking coffee, Hannah [name] will administer suppository prior to dialysis  ROS: nausea improved, +constipation   Objective:   No results found. Recent Labs    04/30/24 2249 05/02/24 0447  WBC 5.4 5.7  HGB 9.7* 9.4*  HCT 30.2* 29.3*  PLT 191 190   Recent Labs    04/30/24 0338  NA 133*  K 5.0  CL 95*  CO2 30  GLUCOSE 174*  BUN 38*  CREATININE 7.91*  CALCIUM  9.0    Intake/Output Summary (Last 24 hours) at 05/02/2024 1530 Last data filed at 05/02/2024 1315 Gross per 24 hour  Intake 395 ml  Output --  Net 395 ml     Wound 04/30/24 1723 Pressure Injury Sacrum Left Stage 2 -  Partial thickness loss of dermis presenting as a shallow open injury with a red, pink wound bed without slough. (Active)    Physical Exam: Vital Signs Blood pressure 138/61, pulse 82, temperature 98.1 F (36.7 C), resp. rate 15, height 5' 2.4 (1.585 m), weight 44.6 kg, SpO2 99%.  Gen: no distress, normal appearing HEENT: oral mucosa pink and moist, NCAT Cardio: Reg rate Chest: normal effort, normal rate of breathing Abd: soft, non-distended Ext: no edema Psych: pleasant, normal affect Skin: intact Neuro: +generalized muscle wasting, +ataxic speech, 4-/5 strength on right sided, otherwise strength 5/5    Assessment/Plan: 1. Functional deficits which require 3+ hours per day of interdisciplinary therapy in a comprehensive inpatient rehab setting. Physiatrist is providing close team supervision and 24 hour management of active medical problems listed below. Physiatrist and rehab team continue to assess barriers to discharge/monitor patient progress toward functional and medical goals  Care Tool:  Bathing    Body parts bathed by patient: Right arm, Chest, Abdomen, Right upper leg, Left upper leg, Face    Body parts bathed by helper: Left arm, Buttocks, Front perineal area, Right lower leg, Left lower leg     Bathing assist Assist Level: Maximal Assistance - Patient 24 - 49%     Upper Body Dressing/Undressing Upper body dressing   What is the patient wearing?: Hospital gown only    Upper body assist Assist Level: Maximal Assistance - Patient 25 - 49%    Lower Body Dressing/Undressing Lower body dressing      What is the patient wearing?: Underwear/pull up     Lower body assist Assist for lower body dressing: Total Assistance - Patient < 25%     Toileting Toileting    Toileting assist Assist for toileting: Maximal Assistance - Patient 25 - 49%     Transfers Chair/bed transfer  Transfers assist     Chair/bed transfer assist level: Maximal Assistance - Patient 25 - 49%     Locomotion Ambulation   Ambulation assist      Assist level: 2 helpers Assistive device: Other (comment) (L handrail) Max distance: 54ft   Walk 10 feet activity   Assist  Walk 10 feet activity did not occur: Safety/medical concerns (fatigue, weakness/deconditioning)  Assist level: 2 helpers Assistive device: Other (comment) (L handrail)  Walk 50 feet activity   Assist Walk 50 feet with 2 turns activity did not occur: Safety/medical concerns (fatigue, weakness/deconditioning)         Walk 150 feet activity   Assist Walk 150 feet activity did not occur: Safety/medical concerns (fatigue, weakness/deconditioning)         Walk 10 feet on uneven surface  activity   Assist Walk 10 feet on uneven surfaces activity did not occur: Safety/medical concerns (fatigue, weakness/deconditioning)         Wheelchair     Assist Is the patient using a wheelchair?: Yes Type of Wheelchair: Manual    Wheelchair assist level: Dependent - Patient 0% Max wheelchair distance: >329ft    Wheelchair 50 feet with 2 turns activity    Assist        Assist Level: Dependent -  Patient 0%   Wheelchair 150 feet activity     Assist      Assist Level: Dependent - Patient 0%   Blood pressure 138/61, pulse 82, temperature 98.1 F (36.7 C), resp. rate 15, height 5' 2.4 (1.585 m), weight 44.6 kg, SpO2 99%.  Medical Problem List and Plan: 1. Functional deficits secondary to RIght dorsal midbrain infarct             -patient may  shower             -ELOS/Goals: 10-14d sup/minA  Chart and therapy notes reviewed, continue CIR, discussed patient's progress with therapy Grounds pass ordered Vitamin D3/Metanx/Vitamin B/C complex ordered F/u with me or Hannah [name] in clinic within 1 month Would benefit from home health aide upon discharge  This patient is capable of making decisions on her own behalf.  2.  Anemia: Heparin  d/ced due to bleeding from fistula site, repeat CBC tomorrow, stool occult ordered             -antiplatelet therapy: ASA/Ticagrelor   3. H/o Sacroiliitis: Tylenol  prn mild pain. Decrease hydrocodone  to 1 tab q12H for severe pain  4. Insomnia: continue prn melatonin   5. T2DM:  Hgb A1c-  8.5. Monitor BS ac/hs and use SSI for elevated BS.              --was on insulin  glargine 12 units w/ 5 units novolog  TID. Increase glargine to 11U             --will continue to monitor BS for trend and titrate insulin  as needed.   6. ESRD: HD MWF at the end of the day to help with tolerance of therapy.              --Phoslo  tid ac for metabolic bone disease.   Continue fluid restriction  7. Hypotension: decrease amlodipine  to 2.5mg , continue coreg , hydralazine  and imdur .  8. H/o Chorioretinitis: continue azithropine daily. .   9. Constipation: discussed plan for suppository before dialysis today  10. GERD: continue pepcid   LOS: 2 days A FACE TO FACE EVALUATION WAS PERFORMED  Hannah [name] 05/02/2024, 3:30 PM

## 2024-05-02 NOTE — Progress Notes (Addendum)
 [facility] Progress Note   Subjective:    Seen and examined patient at bedside. She 's feeling well and denied any bleeding from access overnight. Heparin  SQ was stopped yesterday. Plan for HD this afternoon and will monitor for signs of any bleeding.  Objective Vitals:   05/01/24 1959 05/02/24 0509 05/02/24 0600 05/02/24 1314  BP: (!) 149/61 (!) 107/58  (!) 130/54  Pulse: 82 77  86  Resp: 16 17  17   Temp: 98 F (36.7 C) 98 F (36.7 C)  98.6 F (37 C)  TempSrc: Oral Oral  Oral  SpO2: 100% 100%  98%  Weight:   41.2 kg   Height:       Physical Exam   General: WDWN NAD Head: Sclera not icteric  Lungs: CTA bilaterally. No wheeze, rales or rhonchi. Breathing is unlabored. Heart: RRR. No murmur, rubs or gallops.  Abdomen: soft and non-tender Lower extremities:no edema, ischemic changes, or open wounds  Neuro: AAOx3. Moves all extremities spontaneously. Dialysis Access: R AVG (wrapped; no bleeding on exam today)  Filed Weights   04/30/24 1518 05/01/24 0459 05/02/24 0600  Weight: 40.4 kg 41.6 kg 41.2 kg    Intake/Output Summary (Last 24 hours) at 05/02/2024 1340 Last data filed at 05/02/2024 1315 Gross per 24 hour  Intake 395 ml  Output --  Net 395 ml    Additional Objective Labs: Basic Metabolic Panel: Recent Labs  Lab 04/25/24 2349 04/27/24 0232 04/29/24 0634 04/30/24 0338  NA 136 133* 134* 133*  K 4.8 4.8 4.5 5.0  CL 98 97* 96* 95*  CO2 29 31 29 30   GLUCOSE 141* 242* 158* 174*  BUN 33* 25* 30* 38*  CREATININE 6.93* 5.95* 6.09* 7.91*  CALCIUM  8.6* 8.5* 9.4 9.0  PHOS 5.7* 4.5  --  4.7*   Liver Function Tests: Recent Labs  Lab 04/25/24 2349 04/30/24 0338  AST  --  13*  ALT  --  8  ALKPHOS  --  38  BILITOT  --  1.0  PROT  --  6.4*  ALBUMIN 3.0* 3.0*   No results for input(s): LIPASE, AMYLASE in the last 168 hours. CBC: Recent Labs  Lab 04/27/24 0232 04/29/24 0634 04/30/24 0338 04/30/24 2249 05/02/24 0447  WBC 4.5 4.8  5.3 5.4 5.7  NEUTROABS  --   --  3.6  --  4.2  HGB 10.0* 9.9* 9.8* 9.7* 9.4*  HCT 31.0* 30.6* 31.1* 30.2* 29.3*  MCV 95.1 95.6 96.9 96.2 96.4  PLT 176 182 191 191 190   Blood Culture No results found for: SDES, SPECREQUEST, CULT, REPTSTATUS  Cardiac Enzymes: No results for input(s): CKTOTAL, CKMB, CKMBINDEX, TROPONINI in the last 168 hours. CBG: Recent Labs  Lab 05/01/24 1121 05/01/24 1639 05/01/24 2101 05/02/24 0539 05/02/24 1122  GLUCAP 88 176* 190* 161* 205*   Iron Studies: No results for input(s): IRON, TIBC, TRANSFERRIN, FERRITIN in the last 72 hours. Lab Results  Component Value Date   INR 1.1 04/30/2024   INR 1.1 04/23/2024   INR 1.1 09/24/2023   Studies/Results: No results found.  Medications:   [START ON 05/03/2024] amLODipine   2.5 mg Oral Daily   aspirin  EC  81 mg Oral Daily   azaTHIOprine   50 mg Oral Daily   B-complex with vitamin C  1 tablet Oral Daily   calcium  acetate  667 mg Oral TID WC   carvedilol   25 mg Oral BID WC   Chlorhexidine  Gluconate Cloth  6 each Topical Q0600  cholecalciferol   1,000 Units Oral Daily   famotidine   20 mg Oral QHS   hydrALAZINE   50 mg Oral TID   insulin  aspart  0-5 Units Subcutaneous QHS   insulin  aspart  0-9 Units Subcutaneous TID WC   insulin  glargine  11 Units Subcutaneous Daily   isosorbide  mononitrate  30 mg Oral QHS   l-methylfolate-B6-B12  1 tablet Oral Daily   polyethylene glycol  17 g Oral BID   rosuvastatin   20 mg Oral Daily   ticagrelor   90 mg Oral BID    Dialysis Orders: AF MWF 3:30 BFR 350 2K/2Ca AVG 16g No heparin   -EDW 45.4kg -No ESA  -Hectorol  1 mcg q HD  Assessment/Plan: Acute CVA. H/o prior strokes. 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 Management per neuro. Now transferred to CIR. ESRD.  HD MWF. Continue per schedule. Next HD this afternoon.  Prolonged bleeding from AVG previously. No bleeding occurred as of 12/12 but noted another episode of bleeding 12/15. Heparin  SQ  now stopped. Not getting heparin  with HD. Monitor closely. Will consider a F'gram if bleeding persist. Hypertension. Home meds resumed. BP now at goal.  Volume. No evidence of volume overload on exam. Continue UF with HD as able.  Anemia. 9.7, follow and will start ESA if remains <10 Metabolic bone disease: calcium  and phosphorus controlled. Continue Hectorol , Phoslo  binder  T2DM. Insulin  per primary   Charmaine Piety, NP [facility] 05/02/2024,1:40 PM  LOS: 2 days

## 2024-05-02 NOTE — Progress Notes (Addendum)
 Patient ID: Hannah [name], female   DOB: 11/29/1965, 58 y.o.   MRN: [id number]  This SW covering for primary SW, [name] [name].   1220- SW spoke with pt dtr Miesha to provide updates from team conference, d/c date 12/28, and fam edu. SW encouraged fam edu next Tuesday and informed to call this SW back or primary SW when she and her siblings decide on a date/time. She confirms pt mother has dialysis MWF. 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 SW encouraged her to bring in application and we will assist with turning in application. No other questions/concerns reported.   SW emailed [facility] to confirm a re-certification application is still needed and waiting on follow-up.   SW updated Tracy Mounce/Abigail Pack dialysis coord on d/c date.   Graeme Jude, MSW, LCSW Office: [phone] Cell: [phone] Fax: [phone]

## 2024-05-03 LAB — OCCULT BLOOD X 1 CARD TO LAB, STOOL
Fecal Occult Bld: NEGATIVE
Fecal Occult Bld: NEGATIVE

## 2024-05-03 LAB — CBC WITH DIFFERENTIAL/PLATELET
Abs Immature Granulocytes: 0.02 K/uL (ref 0.00–0.07)
Basophils Absolute: 0 K/uL (ref 0.0–0.1)
Basophils Relative: 1 %
Eosinophils Absolute: 0.1 K/uL (ref 0.0–0.5)
Eosinophils Relative: 2 %
HCT: 27.3 % — ABNORMAL LOW (ref 36.0–46.0)
Hemoglobin: 8.8 g/dL — ABNORMAL LOW (ref 12.0–15.0)
Immature Granulocytes: 0 %
Lymphocytes Relative: 14 %
Lymphs Abs: 0.9 K/uL (ref 0.7–4.0)
MCH: 31.1 pg (ref 26.0–34.0)
MCHC: 32.2 g/dL (ref 30.0–36.0)
MCV: 96.5 fL (ref 80.0–100.0)
Monocytes Absolute: 0.4 K/uL (ref 0.1–1.0)
Monocytes Relative: 7 %
Neutro Abs: 4.7 K/uL (ref 1.7–7.7)
Neutrophils Relative %: 76 %
Platelets: 150 K/uL (ref 150–400)
RBC: 2.83 MIL/uL — ABNORMAL LOW (ref 3.87–5.11)
RDW: 15.8 % — ABNORMAL HIGH (ref 11.5–15.5)
WBC: 6.2 K/uL (ref 4.0–10.5)
nRBC: 0.6 % — ABNORMAL HIGH (ref 0.0–0.2)

## 2024-05-03 LAB — GLUCOSE, CAPILLARY
Glucose-Capillary: 241 mg/dL — ABNORMAL HIGH (ref 70–99)
Glucose-Capillary: 194 mg/dL — ABNORMAL HIGH (ref 70–99)
Glucose-Capillary: 216 mg/dL — ABNORMAL HIGH (ref 70–99)
Glucose-Capillary: 171 mg/dL — ABNORMAL HIGH (ref 70–99)

## 2024-05-03 MED ORDER — HYDROCODONE-ACETAMINOPHEN 5-325 MG PO TABS
1.0000 | ORAL_TABLET | Freq: Every day | ORAL | Status: DC | PRN
  Administered 2024-05-03 – 2024-05-08 (×4): 1 via ORAL
  Filled 2024-05-03 (×5): qty 1

## 2024-05-03 MED ORDER — INSULIN GLARGINE 100 UNIT/ML ~~LOC~~ SOLN
10.0000 [IU] | Freq: Every day | SUBCUTANEOUS | Status: DC
  Administered 2024-05-03 – 2024-05-12 (×10): 10 [IU] via SUBCUTANEOUS
  Filled 2024-05-03 (×11): qty 0.1

## 2024-05-03 MED ORDER — CHLORHEXIDINE GLUCONATE CLOTH 2 % EX PADS
6.0000 | MEDICATED_PAD | Freq: Every day | CUTANEOUS | Status: DC
  Administered 2024-05-04 – 2024-05-05 (×2): 6 via TOPICAL

## 2024-05-03 NOTE — Progress Notes (Signed)
 Occupational Therapy Session Note  Patient Details  Name: Hannah [name] MRN: [id number] Date of Birth: [birth year]  Today's Date: 05/03/2024 OT Individual Time: 9263-9170 OT Individual Time Calculation (min): 53 min    Short Term Goals: Week 1:  OT Short Term Goal 1 (Week 1): Pt will perform toilet transfers MIN A OT Short Term Goal 2 (Week 1): Pt will perform LB dressing MOD A OT Short Term Goal 3 (Week 1): Pt will improve sitting at EOB for 10 minutes to perform ADL task  Skilled Therapeutic Interventions/Progress Updates:     Pt received semi-reclined in bed with DTR present in room. Pt presenting to be in good spirits receptive to skilled OT session reporting 0/10 pain- OT offering intermittent rest breaks, repositioning, and therapeutic support to optimize participation in therapy session. Pt eating breakfast at beginning of session, provided increased time for Pt to finish breakfast to support nutritional intake. Able to self-feed using L UE with set-up A. Spent time discussing d/c plans, location, and family support with both Pt and DTR to support improved d/c plan. DTR reporting she and her brother will be providing 24/7 SUP at their apartment. Pt will be using a tub shower and already owns a TTB, however DTR reported it is old and Pt may need a new one. Educated on CVA etiology/recovery process, BE-FAST signs/symptoms of CVA, and modifiable risk factors for CVA with Pt and DTR receptive to education. Pt and DTR receptive to follow-up HHOT services. Pt reporting increased nausea following breakfast. Informed RN who was in/out to provide medications. Pt requesting to get dressed bed level to avoid increase feelings of nausea. Able to donn OH shirt in long sitting position with MOD A +increased time. OT assisted with weaving B LEs into pants and Pt was then able to bring pants to waist by bridging hips x2 trials. Pt was left resting in bed with call bell in reach, bed alarm on, and all needs  met.    Therapy Documentation Precautions:  Precautions Precautions: Fall Precaution/Restrictions Comments: R hemiparesis Restrictions Weight Bearing Restrictions Per Provider Order: No   Therapy/Group: Individual Therapy  Katheryn SHAUNNA Mines 05/03/2024, 7:19 AM

## 2024-05-03 NOTE — Plan of Care (Signed)
°  Problem: Consults Goal: RH STROKE PATIENT EDUCATION Description: See Patient Education module for education specifics  Outcome: Progressing Goal: Diabetes Guidelines if Diabetic/Glucose > 140 Description: If diabetic or lab glucose is > 140 mg/dl - Initiate Diabetes/Hyperglycemia Guidelines & Document Interventions  Outcome: Progressing   Problem: RH BOWEL ELIMINATION Goal: RH STG MANAGE BOWEL WITH ASSISTANCE Description: STG Manage Bowel with mod I Assistance. Outcome: Progressing Goal: RH STG MANAGE BOWEL W/MEDICATION W/ASSISTANCE Description: STG Manage Bowel with Medication with mod I Assistance. Outcome: Progressing   Problem: RH BLADDER ELIMINATION Goal: RH STG MANAGE BLADDER WITH ASSISTANCE Description: STG Manage Bladder With toileting Assistance Outcome: Progressing   Problem: RH SAFETY Goal: RH STG ADHERE TO SAFETY PRECAUTIONS W/ASSISTANCE/DEVICE Description: STG Adhere to Safety Precautions With cues Assistance/Device. Outcome: Progressing   Problem: RH KNOWLEDGE DEFICIT Goal: RH STG INCREASE KNOWLEDGE OF DIABETES Description: Patient and dtr will be able to manage DM using educational resources for medications and dietary modification independently Outcome: Progressing Goal: RH STG INCREASE KNOWLEDGE OF HYPERTENSION Description: Patient and dtr will be able to manage HTN using educational resources for medications and dietary modification independently Outcome: Progressing Goal: RH STG INCREASE KNOWLEGDE OF HYPERLIPIDEMIA Description: Patient and dtr will be able to manage HLD using educational resources for medications and dietary modification independently Outcome: Progressing Goal: RH STG INCREASE KNOWLEDGE OF STROKE PROPHYLAXIS Description: Patient and dtr will be able to manage secondary risks using educational resources for medications and dietary modification independently Outcome: Progressing

## 2024-05-03 NOTE — Progress Notes (Signed)
 Physical Therapy Session Note  Patient Details  Name: Hannah [name] MRN: [id number] Date of Birth: 1965/10/01  Today's Date: 05/03/2024 PT Individual Time: 8884-8841 + 1300-1343 PT Individual Time Calculation (min): 43 min  + 43 min  Short Term Goals: Week 1:  PT Short Term Goal 1 (Week 1): pt will transfer supine<>sitting EOB with min A consistantly PT Short Term Goal 2 (Week 1): pt will transfer sit<>stand with LRAD and min A consistantly PT Short Term Goal 3 (Week 1): pt will perform bed<>chair transfer with LRAD and min A consistantly  Skilled Therapeutic Interventions/Progress Updates:     1st session: Pt sitting in w/c on arrival - agreeable to therapy treatment. She has no reports of pain. Transported patient at w/c level to main gym for time management.   Retrieved EVA walker to facilitate gait training. Sit<>stand to EVA walker with minA for powering to rise. Once standing, able to stand with B platform support with CGA/minA. She ambulated 48' with minA for EVA walker management only. Demonstrates narrow BOS while almost stepping on her toes while ambulating. Cues throughout for widening BOS and improving overall extension. Continued gait training without EVA walker with B HHA (her hands on PT forearms) while ambulated 3x30' with modA.   Initiated stair training using 6 steps and 2 hand rails. She was able to ascend x4 stairs with modA of 1 person, step to pattern while forward facing with L foot leading her ascent. Once at the top of the stairs, patient having increased fear of falling, resorting to requiring +2 assist maxA for safely descending the stairs.   Worked on w/c mobility for remainder of session where she self propelled ~50' with supervision, using BLE and LUE to propel. Pt reports her daughter typically pushes her and she doesn't do much self propelling. Speed slow, difficulty turning L > R. Returned remaining distance due to time. Left sitting up in w/c with seat belt  alarm on, her personal items and call bell within reach.   *delayed initiation and processing throughout session  2nd session: Pt sitting in w/c on arrival - has no reports of pain. Ate ~25% of her lunch, reports not much of an appetite.   Transported patient at w/c level to day room gym. She was assisted onto the treadmill with modA. Treadmill training completed without BWS with minA from PT using gait belt: -1:12 minutes, 0.3 mph, 58ft. Used white line in middle of treadmill as visual cue to separate feet to get adequate BOS and from LLE crossing past midline. Distance limited by fatigue.  -0:53 seconds, 0.84mph, 73ft. Pt w/ sudden onset of fatigue and needing to sit quickly. Recovers with seated rest.   Vitals taken: BP sitting: 129/59 HR 88 BP standing: 145/65 HR 97  -1:10 seconds, 0.80mph, 30'. Similar cues as above for upright posture, increasing stride length and keeping adequate width for her  BOS.  -1:22 seconds, 0.25mph, 35'  ModA needed for safely stepping backwards off of treadmill. Seated rest breaks provided throughout session due to activity tolerance limitations.   Returned to her room and she concluded session seated in w/c with seat belt alarm on.    Therapy Documentation Precautions:  Precautions Precautions: Fall Precaution/Restrictions Comments: R hemiparesis Restrictions Weight Bearing Restrictions Per Provider Order: No General:      Therapy/Group: Individual Therapy  Dao Memmott P Rayla Pember 05/03/2024, 11:52 AM

## 2024-05-03 NOTE — Progress Notes (Signed)
 Physical Therapy Session Note  Patient Details  Name: Hannah [name] MRN: [id number] Date of Birth: 12/31/1965  Today's Date: 05/03/2024 PT Individual Time: 1000-1100 PT Individual Time Calculation (min): 60 min   Short Term Goals: Week 1:  PT Short Term Goal 1 (Week 1): pt will transfer supine<>sitting EOB with min A consistantly PT Short Term Goal 2 (Week 1): pt will transfer sit<>stand with LRAD and min A consistantly PT Short Term Goal 3 (Week 1): pt will perform bed<>chair transfer with LRAD and min A consistantly  Skilled Therapeutic Interventions/Progress Updates:     Pt semi-reclined in bed with daughter present/leaving upon arrival. Pt denies pain and agreeable to therapy. Session emphasized functional strengthening and endurance with bed mobility and transfers. Pt performed semi-reclined to sitting EOB with mod A and use of bed rail. Pt balanced EOB with B UE support while PT donned shoes dependent. Pt performed sit to stands EOB <> RW with mod A. Pt performed alternating low marching inside RW in preparation for stand pivot transfer to WC. Buckling noted in L knee with marching. In standing position, pt reported having a BM. Pt requested to return to sitting as the BM became painful. Pt sat EOB/lying on her L side as she continued to pass BM in brief. Once BM complete, pt returned to supine with mod A. Pt performed bridging through B LE and R/L rolling to assist while PT performed peri hygiene and changed brief with max-total A. Nursing notified of BM for stool sample. Pt then sat to EOB again with mod A. Pt performed SBT to WC with mod A and VC for head/hip ratio and hand placement and assist placing board. Pt remained seated in WC, seat belt alarm on, and all needs in reach at end of session.  Therapy Documentation Precautions:  Precautions Precautions: Fall Precaution/Restrictions Comments: R hemiparesis Restrictions Weight Bearing Restrictions Per Provider Order:  No  Therapy/Group: Individual Therapy  [name], PT, DPT 05/03/2024, 7:34 AM

## 2024-05-03 NOTE — Progress Notes (Addendum)
 PROGRESS NOTE   Subjective/Complaints: No new complaints this morning Hgb decreased to 8.8 despite being off heparin , stool occult ordered Daughter at bedside  ROS: nausea improved, +constipation- conitnues   Objective:   No results found. Recent Labs    05/02/24 0447 05/03/24 0526  WBC 5.7 6.2  HGB 9.4* 8.8*  HCT 29.3* 27.3*  PLT 190 150   No results for input(s): NA, K, CL, CO2, GLUCOSE, BUN, CREATININE, CALCIUM  in the last 72 hours.   Intake/Output Summary (Last 24 hours) at 05/03/2024 1114 Last data filed at 05/03/2024 0830 Gross per 24 hour  Intake 356 ml  Output 0 ml  Net 356 ml     Wound 04/30/24 1723 Pressure Injury Sacrum Left Stage 2 -  Partial thickness loss of dermis presenting as a shallow open injury with a red, pink wound bed without slough. (Active)    Physical Exam: Vital Signs Blood pressure 132/62, pulse 92, temperature 98.1 F (36.7 C), resp. rate 17, height 5' 2.4 (1.585 m), weight 50 kg, SpO2 100%.  Gen: no distress, normal appearing HEENT: oral mucosa pink and moist, NCAT Cardio: Reg rate Chest: normal effort, normal rate of breathing Abd: soft, non-distended Ext: no edema Psych: pleasant, normal affect Skin: intact Neuro: +generalized muscle wasting, +ataxic speech- improving, 4-/5 strength on the right side, otherwise strength is 5/5    Assessment/Plan: 1. Functional deficits which require 3+ hours per day of interdisciplinary therapy in a comprehensive inpatient rehab setting. Physiatrist is providing close team supervision and 24 hour management of active medical problems listed below. Physiatrist and rehab team continue to assess barriers to discharge/monitor patient progress toward functional and medical goals  Care Tool:  Bathing    Body parts bathed by patient: Right arm, Chest, Abdomen, Right upper leg, Left upper leg, Face   Body parts bathed by  helper: Left arm, Buttocks, Front perineal area, Right lower leg, Left lower leg     Bathing assist Assist Level: Maximal Assistance - Patient 24 - 49%     Upper Body Dressing/Undressing Upper body dressing   What is the patient wearing?: Hospital gown only    Upper body assist Assist Level: Maximal Assistance - Patient 25 - 49%    Lower Body Dressing/Undressing Lower body dressing      What is the patient wearing?: Underwear/pull up     Lower body assist Assist for lower body dressing: Total Assistance - Patient < 25%     Toileting Toileting    Toileting assist Assist for toileting: Maximal Assistance - Patient 25 - 49%     Transfers Chair/bed transfer  Transfers assist     Chair/bed transfer assist level: Maximal Assistance - Patient 25 - 49%     Locomotion Ambulation   Ambulation assist      Assist level: 2 helpers Assistive device: Other (comment) (L handrail) Max distance: 24ft   Walk 10 feet activity   Assist  Walk 10 feet activity did not occur: Safety/medical concerns (fatigue, weakness/deconditioning)  Assist level: 2 helpers Assistive device: Other (comment) (L handrail)   Walk 50 feet activity   Assist Walk 50 feet with 2 turns activity did not occur: Safety/medical  concerns (fatigue, weakness/deconditioning)         Walk 150 feet activity   Assist Walk 150 feet activity did not occur: Safety/medical concerns (fatigue, weakness/deconditioning)         Walk 10 feet on uneven surface  activity   Assist Walk 10 feet on uneven surfaces activity did not occur: Safety/medical concerns (fatigue, weakness/deconditioning)         Wheelchair     Assist Is the patient using a wheelchair?: Yes Type of Wheelchair: Manual    Wheelchair assist level: Dependent - Patient 0% Max wheelchair distance: >347ft    Wheelchair 50 feet with 2 turns activity    Assist        Assist Level: Dependent - Patient 0%    Wheelchair 150 feet activity     Assist      Assist Level: Dependent - Patient 0%   Blood pressure 132/62, pulse 92, temperature 98.1 F (36.7 C), resp. rate 17, height 5' 2.4 (1.585 m), weight 50 kg, SpO2 100%.  Medical Problem List and Plan: 1. Functional deficits secondary to RIght dorsal midbrain infarct             -patient may  shower             -ELOS/Goals: 10-14d sup/minA  Chart and therapy notes reviewed, continue CIR, discussed patient's progress with therapy Grounds pass ordered Vitamin D3/Metanx/Vitamin B/C complex ordered F/u with me or Fidela in clinic within 1 month Would benefit from home health aide upon discharge  This patient is capable of making decisions on her own behalf. Updated daughter  2.  Anemia: Heparin  d/ced due to bleeding from fistula site, repeat CBC tomorrow, stoll occult ordered             -antiplatelet therapy: ASA/Ticagrelor   3. H/o Sacroiliitis: Tylenol  prn mild pain. Decrease Norco to 1 tab daily prn  4. Insomnia: continue prn melatonin   5. T2DM:  Hgb A1c-  8.5. Monitor BS ac/hs and use SSI for elevated BS.              --was on insulin  glargine 12 units w/ 5 units novolog  TID. Increase glargine to 11U but patient refuses this dose and wants 10U due to past experiences with hypoglycemia- changed back             --will continue to monitor BS for trend and titrate insulin  as needed.   6. ESRD: HD MWF at the end of the day to help with tolerance of therapy.              --Phoslo  tid ac for metabolic bone disease.   Continue fluid restriction  7. Hypotension: decrease amlodipine  to 2.5mg , continue coreg , hydralazine  and imdur .  8. H/o Chorioretinitis: continue azithropine daily. .   9. Constipation: discussed plan for suppository before dialysis today  10. GERD: continue pepcid   LOS: 3 days A FACE TO FACE EVALUATION WAS PERFORMED  Mizael Sagar P Mariea Mcmartin 05/03/2024, 11:14 AM

## 2024-05-03 NOTE — Progress Notes (Signed)
 Contacted out-pt HD clinic, [facility], to inform of pt anticipated discharge date of 12/28. Will continue to follow and assist.   Lavanda Fredrickson Dialysis Navigator [phone]

## 2024-05-03 NOTE — Progress Notes (Signed)
 Pt refused to allow writer to obtain FSBS or vitals despite receiving education on the importance of both. Charge nurse informed and also attempted to speak with pt at which time pt accused both clinical research associate and charge nurse of lying and continued to refuse FSBS and vitals.

## 2024-05-03 NOTE — Progress Notes (Signed)
 [facility] Progress Note   Subjective:    Seen and examined patient at bedside. Tolerated yesterday's HD and ran even. She denied access bleeding from yesterday's treatment. Continue to monitor. Next HD 12/19.  Objective Vitals:   05/02/24 2159 05/03/24 0455 05/03/24 0500 05/03/24 1400  BP: 130/66 132/62  (!) 135/55  Pulse:  92  90  Resp:  17  18  Temp:  98.1 F (36.7 C)  98.5 F (36.9 C)  TempSrc:    Oral  SpO2:  100%  100%  Weight:   50 kg   Height:       Physical Exam General: WDWN NAD Head: Sclera not icteric  Lungs: CTA bilaterally. No wheeze, rales or rhonchi. Breathing is unlabored. Heart: RRR. No murmur, rubs or gallops.  Abdomen: soft and non-tender Lower extremities:no edema, ischemic changes, or open wounds  Neuro: AAOx3. Moves all extremities spontaneously. Dialysis Access: R AVG (wrapped; no bleeding on exam today)  Filed Weights   05/02/24 1421 05/02/24 1821 05/03/24 0500  Weight: 44.6 kg 44.4 kg 50 kg    Intake/Output Summary (Last 24 hours) at 05/03/2024 1646 Last data filed at 05/03/2024 1402 Gross per 24 hour  Intake 180 ml  Output 0 ml  Net 180 ml    Additional Objective Labs: Basic Metabolic Panel: Recent Labs  Lab 04/27/24 0232 04/29/24 0634 04/30/24 0338  NA 133* 134* 133*  K 4.8 4.5 5.0  CL 97* 96* 95*  CO2 31 29 30   GLUCOSE 242* 158* 174*  BUN 25* 30* 38*  CREATININE 5.95* 6.09* 7.91*  CALCIUM  8.5* 9.4 9.0  PHOS 4.5  --  4.7*   Liver Function Tests: Recent Labs  Lab 04/30/24 0338  AST 13*  ALT 8  ALKPHOS 38  BILITOT 1.0  PROT 6.4*  ALBUMIN 3.0*   No results for input(s): LIPASE, AMYLASE in the last 168 hours. CBC: Recent Labs  Lab 04/29/24 0634 04/30/24 0338 04/30/24 2249 05/02/24 0447 05/03/24 0526  WBC 4.8 5.3 5.4 5.7 6.2  NEUTROABS  --  3.6  --  4.2 4.7  HGB 9.9* 9.8* 9.7* 9.4* 8.8*  HCT 30.6* 31.1* 30.2* 29.3* 27.3*  MCV 95.6 96.9 96.2 96.4 96.5  PLT 182 191 191 190 150   Blood  Culture No results found for: SDES, SPECREQUEST, CULT, REPTSTATUS  Cardiac Enzymes: No results for input(s): CKTOTAL, CKMB, CKMBINDEX, TROPONINI in the last 168 hours. CBG: Recent Labs  Lab 05/02/24 0539 05/02/24 1122 05/02/24 2055 05/03/24 0550 05/03/24 1156  GLUCAP 161* 205* 325* 241* 194*   Iron Studies: No results for input(s): IRON, TIBC, TRANSFERRIN, FERRITIN in the last 72 hours. Lab Results  Component Value Date   INR 1.1 04/30/2024   INR 1.1 04/23/2024   INR 1.1 09/24/2023   Studies/Results: No results found.  Medications:   amLODipine   2.5 mg Oral Daily   aspirin  EC  81 mg Oral Daily   azaTHIOprine   50 mg Oral Daily   B-complex with vitamin C  1 tablet Oral Daily   calcium  acetate  667 mg Oral TID WC   carvedilol   25 mg Oral BID WC   Chlorhexidine  Gluconate Cloth  6 each Topical Q0600   cholecalciferol   1,000 Units Oral Daily   famotidine   20 mg Oral QHS   hydrALAZINE   50 mg Oral TID   insulin  aspart  0-5 Units Subcutaneous QHS   insulin  aspart  0-9 Units Subcutaneous TID WC   insulin  glargine  10 Units Subcutaneous Daily  isosorbide  mononitrate  30 mg Oral QHS   l-methylfolate-B6-B12  1 tablet Oral Daily   polyethylene glycol  17 g Oral BID   rosuvastatin   20 mg Oral Daily   ticagrelor   90 mg Oral BID    Dialysis Orders: AF MWF 3:30 BFR 350 2K/2Ca AVG 16g No heparin   -EDW 45.4kg -No ESA  -Hectorol  1 mcg q HD  Assessment/Plan: Acute CVA. H/o prior strokes. 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 Management per neuro. Now transferred to CIR. ESRD.  HD MWF. Continue per schedule. Next HD this afternoon.  Prolonged bleeding from AVG previously. No bleeding occurred as of 12/12 but noted another episode of bleeding 12/15. No further bleeding since Heparin  SQ has been stopped. Continue to monitor. Consider a F'gram if bleeding persist. Hypertension. Home meds resumed. BP now at goal.  Volume. No evidence of volume overload on exam.  Continue UF with HD as able.  Anemia. 8.8, follow and will start ESA if remains <10 Metabolic bone disease: calcium  and phosphorus controlled. Continue Hectorol , Phoslo  binder  T2DM. Insulin  per primary   Charmaine Piety, NP [facility] 05/03/2024,4:46 PM  LOS: 3 days

## 2024-05-03 NOTE — Progress Notes (Signed)
 1100 PM Writer receives call from pt dtg at which time clinical research associate explains  to dtg the current medication's her mother has received and her mother's refusal to allow her FSBS or vitals to be obtained. Pt  Dtg verbalizes understanding and appreciation and states  I will talk with my mom and see if I can get her to allow you to check her sugar.   1115 Writer called to room per pt... Writer who is accompanied by charge nurse enters pt room at which time pt request that her FSBS be obtained pt is noted to be on speaker phone with dtg who is made aware  of staff members presence in room. FSBS obtained and noted to not meet parameters for pt to receive insulin  via s/s. Pt and dtg made aware.

## 2024-05-03 NOTE — IPOC Note (Signed)
 Overall Plan of Care [facility]) Patient Details Name: Hannah [name] MRN: [id number] DOB: 06-23-1965  Admitting Diagnosis: Small vessel stroke [facility])  Hospital Problems: Principal Problem:   Small vessel stroke (HCC)     Functional Problem List: Nursing Safety, Endurance, Medication Management, Pain, Bowel, Bladder  PT Balance, Edema, Endurance, Motor, Nutrition, Pain, Perception, Safety, Sensory, Skin Integrity  OT Balance, Behavior, Cognition, Endurance, Motor, Safety, Vision  SLP Cognition, Motor  TR         Basic ADLs: OT Eating, Grooming, Bathing, Dressing, Toileting     Advanced  ADLs: OT None     Transfers: PT Bed Mobility, Car, Furniture, Bed to Research Scientist (medical), Research Scientist (life Sciences): PT Ambulation, Psychologist, [employer], Stairs     Additional Impairments: OT None  SLP Social Cognition   Problem Solving, Memory  TR      Anticipated Outcomes Item Anticipated Outcome  Self Feeding set up  Swallowing      Basic self-care  MIN A  Toileting  MIN A   Bathroom Transfers Supervision  Bowel/Bladder  manage bowel w mod I and bladder w toileting  Transfers  CGA with LRAD  Locomotion  min A with LRAD  Communication  minA  Cognition  supervision-minA  Pain  n/a  Safety/Judgment  manage safety w cues   Therapy Plan: PT Intensity: Minimum of 1-2 x/day ,45 to 90 minutes PT Frequency: 5 out of 7 days PT Duration Estimated Length of Stay: 12-14 days OT Intensity: Minimum of 1-2 x/day, 45 to 90 minutes OT Frequency: 5 out of 7 days OT Duration/Estimated Length of Stay: 14-16 days SLP Intensity: Minumum of 1-2 x/day, 30 to 90 minutes SLP Frequency: 1 to 3 out of 7 days SLP Duration/Estimated Length of Stay: 10-12 days   Team Interventions: Nursing Interventions Patient/Family Education, Medication Management, Discharge Planning, Bladder Management, Bowel Management, Disease Management/Prevention  PT interventions Ambulation/gait training, DME/adaptive  equipment instruction, Psychosocial support, UE/LE Strength taining/ROM, Balance/vestibular training, Functional electrical stimulation, Skin care/wound management, UE/LE Coordination activities, Cognitive remediation/compensation, Functional mobility training, Splinting/orthotics, Visual/perceptual remediation/compensation, Community reintegration, Neuromuscular re-education, Wheelchair propulsion/positioning, Discharge planning, Pain management, Therapeutic Activities, Disease management/prevention, Equities Trader education, Therapeutic Exercise, Stair training  OT Interventions Warden/ranger, Disease mangement/prevention, Neuromuscular re-education, Self Care/advanced ADL retraining, Therapeutic Exercise, Wheelchair propulsion/positioning, Cognitive remediation/compensation, DME/adaptive equipment instruction, Pain management, Skin care/wound managment, UE/LE Strength taining/ROM, Community reintegration, Functional electrical stimulation, Patient/family education, Splinting/orthotics, UE/LE Coordination activities, Discharge planning, Functional mobility training, Psychosocial support, Therapeutic Activities, Visual/perceptual remediation/compensation  SLP Interventions Cognitive remediation/compensation, Speech/Language facilitation, Cueing hierarchy, Functional tasks, Therapeutic Activities, Internal/external aids, Patient/family education  TR Interventions    SW/CM Interventions Discharge Planning, Psychosocial Support, Patient/Family Education, Disease Management/Prevention   Barriers to Discharge MD  Medical stability  Nursing Decreased caregiver support, Home environment access/layout Patient was independent at wheelchair level and was able to walk a few steps with use of walker. Dtr states over 1 1/2 years, patient more dependent on her for adls, etc and she has taken the role on to be more efficient  PT Home environment access/layout, Hemodialysis, Weight WC will not fit into  bathroom, required assist at baseline, poor nutritional intake, HD M/W/F  OT Home environment access/layout    SLP      SW       Team Discharge Planning: Destination: PT-Home ,OT- Home , SLP-Home Projected Follow-up: PT-Home health PT, OT-  Home health OT, SLP-Outpatient SLP, 24 hour supervision/assistance Projected Equipment Needs: PT-To be determined, OT- To be determined,  SLP-None recommended by SLP Equipment Details: PT-has WC and RW, OT-  Patient/family involved in discharge planning: PT- Patient, Family member/caregiver,  OT-Patient, Family member/caregiver, SLP-Patient, Family member/caregiver  MD ELOS: 10-14 days S/MinA Medical Rehab Prognosis:  Excellent Assessment: The patient has been admitted for CIR therapies with the diagnosis of CVA. The team will be addressing functional mobility, strength, stamina, balance, safety, adaptive techniques and equipment, self-care, bowel and bladder mgt, patient and caregiver education. Goals have been set at [employer]. Anticipated discharge destination is home.        See Team Conference Notes for weekly updates to the plan of care

## 2024-05-03 NOTE — Progress Notes (Incomplete)
 Patient required disimpaction and soap suds enema for BM this morning. Medium type 1 and 3 with disimpaction.

## 2024-05-04 LAB — RENAL FUNCTION PANEL
Albumin: 3.7 g/dL (ref 3.5–5.0)
Anion gap: 10 (ref 5–15)
BUN: 34 mg/dL — ABNORMAL HIGH (ref 6–20)
CO2: 28 mmol/L (ref 22–32)
Calcium: 9.6 mg/dL (ref 8.9–10.3)
Chloride: 97 mmol/L — ABNORMAL LOW (ref 98–111)
Creatinine, Ser: 5.93 mg/dL — ABNORMAL HIGH (ref 0.44–1.00)
GFR, Estimated: 8 mL/min — ABNORMAL LOW
Glucose, Bld: 112 mg/dL — ABNORMAL HIGH (ref 70–99)
Phosphorus: 4.2 mg/dL (ref 2.5–4.6)
Potassium: 4.9 mmol/L (ref 3.5–5.1)
Sodium: 135 mmol/L (ref 135–145)

## 2024-05-04 LAB — GLUCOSE, CAPILLARY
Glucose-Capillary: 132 mg/dL — ABNORMAL HIGH (ref 70–99)
Glucose-Capillary: 177 mg/dL — ABNORMAL HIGH (ref 70–99)
Glucose-Capillary: 128 mg/dL — ABNORMAL HIGH (ref 70–99)
Glucose-Capillary: 152 mg/dL — ABNORMAL HIGH (ref 70–99)
Glucose-Capillary: 240 mg/dL — ABNORMAL HIGH (ref 70–99)

## 2024-05-04 LAB — CBC WITH DIFFERENTIAL/PLATELET
Abs Immature Granulocytes: 0.02 K/uL (ref 0.00–0.07)
Basophils Absolute: 0.1 K/uL (ref 0.0–0.1)
Basophils Relative: 1 %
Eosinophils Absolute: 0.2 K/uL (ref 0.0–0.5)
Eosinophils Relative: 3 %
HCT: 27 % — ABNORMAL LOW (ref 36.0–46.0)
Hemoglobin: 8.8 g/dL — ABNORMAL LOW (ref 12.0–15.0)
Immature Granulocytes: 0 %
Lymphocytes Relative: 17 %
Lymphs Abs: 1 K/uL (ref 0.7–4.0)
MCH: 31.7 pg (ref 26.0–34.0)
MCHC: 32.6 g/dL (ref 30.0–36.0)
MCV: 97.1 fL (ref 80.0–100.0)
Monocytes Absolute: 0.3 K/uL (ref 0.1–1.0)
Monocytes Relative: 6 %
Neutro Abs: 4.3 K/uL (ref 1.7–7.7)
Neutrophils Relative %: 73 %
Platelets: 150 K/uL (ref 150–400)
RBC: 2.78 MIL/uL — ABNORMAL LOW (ref 3.87–5.11)
RDW: 15.9 % — ABNORMAL HIGH (ref 11.5–15.5)
WBC: 5.8 K/uL (ref 4.0–10.5)
nRBC: 0 % (ref 0.0–0.2)

## 2024-05-04 MED ORDER — PENTAFLUOROPROP-TETRAFLUOROETH EX AERO: 1.0000 | INHALATION_SPRAY | CUTANEOUS | Status: DC | PRN

## 2024-05-04 MED ORDER — LIDOCAINE-PRILOCAINE 2.5-2.5 % EX CREA: 1.0000 | TOPICAL_CREAM | CUTANEOUS | Status: DC | PRN

## 2024-05-04 MED ORDER — AMLODIPINE BESYLATE 5 MG PO TABS
5.0000 mg | ORAL_TABLET | Freq: Every day | ORAL | Status: DC
  Administered 2024-05-05: 5 mg via ORAL
  Filled 2024-05-04: qty 1

## 2024-05-04 MED ORDER — HEPARIN SODIUM (PORCINE) 1000 UNIT/ML DIALYSIS: 1000.0000 [IU] | INTRAMUSCULAR | Status: DC | PRN

## 2024-05-04 MED ORDER — ALTEPLASE 2 MG IJ SOLR: 2.0000 mg | Freq: Once | INTRAMUSCULAR | Status: DC | PRN

## 2024-05-04 MED ORDER — LIDOCAINE HCL (PF) 1 % IJ SOLN: 5.0000 mL | INTRAMUSCULAR | Status: DC | PRN

## 2024-05-04 MED ORDER — LIDOCAINE 5 % EX PTCH
1.0000 | MEDICATED_PATCH | CUTANEOUS | Status: DC
  Administered 2024-05-04 – 2024-05-12 (×6): 1 via TRANSDERMAL
  Filled 2024-05-04 (×7): qty 1

## 2024-05-04 NOTE — Plan of Care (Signed)
" °  Problem: Consults Goal: RH STROKE PATIENT EDUCATION Description: See Patient Education module for education specifics  Outcome: Progressing Goal: Diabetes Guidelines if Diabetic/Glucose > 140 Description: If diabetic or lab glucose is > 140 mg/dl - Initiate Diabetes/Hyperglycemia Guidelines & Document Interventions  Outcome: Progressing   Problem: RH BOWEL ELIMINATION Goal: RH STG MANAGE BOWEL WITH ASSISTANCE Description: STG Manage Bowel with mod I Assistance. Outcome: Progressing Goal: RH STG MANAGE BOWEL W/MEDICATION W/ASSISTANCE Description: STG Manage Bowel with Medication with mod I Assistance. Outcome: Progressing   Problem: RH BLADDER ELIMINATION Goal: RH STG MANAGE BLADDER WITH ASSISTANCE Description: STG Manage Bladder With toileting Assistance Outcome: Progressing   Problem: RH SAFETY Goal: RH STG ADHERE TO SAFETY PRECAUTIONS W/ASSISTANCE/DEVICE Description: STG Adhere to Safety Precautions With cues Assistance/Device. Outcome: Progressing   Problem: RH KNOWLEDGE DEFICIT Goal: RH STG INCREASE KNOWLEDGE OF DIABETES Description: Patient and dtr will be able to manage DM using educational resources for medications and dietary modification independently Outcome: Progressing Goal: RH STG INCREASE KNOWLEDGE OF HYPERTENSION Description: Patient and dtr will be able to manage HTN using educational resources for medications and dietary modification independently Outcome: Progressing Goal: RH STG INCREASE KNOWLEGDE OF HYPERLIPIDEMIA Description: Patient and dtr will be able to manage HLD using educational resources for medications and dietary modification independently Outcome: Progressing Goal: RH STG INCREASE KNOWLEDGE OF STROKE PROPHYLAXIS Description: Patient and dtr will be able to manage secondary risks using educational resources for medications and dietary modification independently Outcome: Progressing

## 2024-05-04 NOTE — Progress Notes (Signed)
 " [facility] Progress Note   Subjective:   Seen in room - eating lunch, coming for HD shortly. Feels ok, no CP/dyspnea.  Objective Vitals:   05/03/24 0455 05/03/24 0500 05/03/24 1400 05/04/24 0500  BP: 132/62  (!) 135/55 (!) 150/68  Pulse: 92  90 81  Resp: 17  18 18   Temp: 98.1 F (36.7 C)  98.5 F (36.9 C) 98.5 F (36.9 C)  TempSrc:   Oral Oral  SpO2: 100%  100% 98%  Weight:  50 kg  46.2 kg  Height:       Physical Exam General: Well appearing, NAD. Room air Heart: RRR Lungs: CTA anteriorly Abdomen: soft Extremities: no LE edema Dialysis Access: R AVG +bruit  Additional Objective Labs: Basic Metabolic Panel: Recent Labs  Lab 04/29/24 0634 04/30/24 0338 05/04/24 0623  NA 134* 133* 135  K 4.5 5.0 4.9  CL 96* 95* 97*  CO2 29 30 28   GLUCOSE 158* 174* 112*  BUN 30* 38* 34*  CREATININE 6.09* 7.91* 5.93*  CALCIUM  9.4 9.0 9.6  PHOS  --  4.7* 4.2   Liver Function Tests: Recent Labs  Lab 04/30/24 0338 05/04/24 0623  AST 13*  --   ALT 8  --   ALKPHOS 38  --   BILITOT 1.0  --   PROT 6.4*  --   ALBUMIN 3.0* 3.7   CBC: Recent Labs  Lab 04/30/24 0338 04/30/24 2249 05/02/24 0447 05/03/24 0526 05/04/24 0623  WBC 5.3 5.4 5.7 6.2 5.8  NEUTROABS 3.6  --  4.2 4.7 4.3  HGB 9.8* 9.7* 9.4* 8.8* 8.8*  HCT 31.1* 30.2* 29.3* 27.3* 27.0*  MCV 96.9 96.2 96.4 96.5 97.1  PLT 191 191 190 150 150   Medications:   [START ON 05/05/2024] amLODipine   5 mg Oral Daily   aspirin  EC  81 mg Oral Daily   azaTHIOprine   50 mg Oral Daily   B-complex with vitamin C  1 tablet Oral Daily   calcium  acetate  667 mg Oral TID WC   carvedilol   25 mg Oral BID WC   Chlorhexidine  Gluconate Cloth  6 each Topical Q0600   cholecalciferol   1,000 Units Oral Daily   famotidine   20 mg Oral QHS   hydrALAZINE   50 mg Oral TID   insulin  aspart  0-5 Units Subcutaneous QHS   insulin  aspart  0-9 Units Subcutaneous TID WC   insulin  glargine  10 Units Subcutaneous Daily   isosorbide   mononitrate  30 mg Oral QHS   l-methylfolate-B6-B12  1 tablet Oral Daily   lidocaine   1 patch Transdermal Q24H   polyethylene glycol  17 g Oral BID   rosuvastatin   20 mg Oral Daily   ticagrelor   90 mg Oral BID    Dialysis Orders AF MWF 3:30 BFR 350 2K/2Ca AVG 16g No heparin   -EDW 45.4kg -No ESA  -Hectorol  1 mcg q HD   Assessment/Plan: Acute CVA. H/o prior strokes. 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 Management per neuro. Now transferred to CIR. ESRD.  HD MWF. For HD today. Prolonged bleeding from AVG previously. No bleeding occurred as of 12/12 but noted another episode of bleeding 12/15. No further bleeding since Heparin  SQ has been stopped. Continue to monitor. Consider a F'gram if bleeding persist. Hypertension. Home meds resumed. BP now at goal.  Volume. No evidence of volume overload on exam. Continue UF with HD as able.  Anemia. 8.8, follow and will start ESA if remains <10 Metabolic bone disease: calcium   and phosphorus controlled. Continue Hectorol , Phoslo  binder  T2DM. Insulin  per primary   FYI -- Holiday Schedule for next week Usual MWF schedule -> Sun (21), Tues (23), Friday (26) Usual TTS schedule -> Mon (22), Wed (24), Sat (27)   Katie Mann Skaggs, PA-C 05/04/2024, 1:21 PM  [facility]    "

## 2024-05-04 NOTE — Progress Notes (Signed)
 Speech Language Pathology Daily Session Note  Patient Details  Name: Hannah [name] MRN: [id number] Date of Birth: February 01, 1966  Today's Date: 05/04/2024 SLP Individual Time: 0930-1027 SLP Individual Time Calculation (min): 57 min  Short Term Goals: Week 1: SLP Short Term Goal 1 (Week 1): Patient will solve mildly complex structured problems with min assist. SLP Short Term Goal 2 (Week 1): Patient will utilize internal/external memory aids to recall daily information with 80% accuracy given mod assist. SLP Short Term Goal 3 (Week 1): Patient will utilize SLOP speech intelligibility strategies at the phrase level 60% of the time with min assist.  Skilled Therapeutic Interventions: SLP conducted skilled therapy session targeting cognition and communication goals. SLP reviewed SLOP speech intelligibility strategies introduced in previous session. Patient required total reeducation to recall strategies and SLP provided printed handout to promote carryover. Patient utilized speech intelligibility strategies with supervision at the complex sentence level. Patient then completed mildly complex verbally presented problem solving questions with overall supervision-min assist for +processing speed.  Patient endorses that her speech and thinking skills are approximately 80% back to base, will continue to target goals during admission vs. Sign off. In final minutes of session, presented patient with visual to memorize. After 2 minutes of blocked time for memorization, patient recalled information with 80% acc. Immediately and then with 100% acc. After 5 minute distracted delay. Patient was left in room with call bell in reach and alarm set. SLP will continue to target goals per plan of care.        Pain  None  Therapy/Group: Individual Therapy  Fidencio Duddy, M.A., CCC-SLP  Hannah [name] 05/04/2024, 10:27 AM

## 2024-05-04 NOTE — Progress Notes (Signed)
 Physical Therapy Session Note  Patient Details  Name: Hannah [name] MRN: [id number] Date of Birth: 1965-09-03  Today's Date: 05/04/2024 PT Individual Time: 0801-0911 PT Individual Time Calculation (min): 70 min   Short Term Goals: Week 1:  PT Short Term Goal 1 (Week 1): pt will transfer supine<>sitting EOB with min A consistantly PT Short Term Goal 2 (Week 1): pt will transfer sit<>stand with LRAD and min A consistantly PT Short Term Goal 3 (Week 1): pt will perform bed<>chair transfer with LRAD and min A consistantly  Skilled Therapeutic Interventions/Progress Updates:   Received pt semi-reclined in bed with daughter at bedside. Pt agreeable to PT treatment and reported pain in buttocks from lying in bed - improved once OOB. Session with emphasis on functional mobility/transfers, dressing, generalized strengthening and endurance, dynamic standing balance/coordination, and gait training. Pt transferred semi-reclined<>sitting R EOB with HOB elevated and use of bedrails with supervision and ++ time/effort. Pt able to scoot to EOB without assist. Stood x 3 trials from EOB with RW and mod A and removed soiled brief and performed hygiene management in standing with mod A due to posterior bias. Donned clean brief, socks, pants, and shoes sitting EOB with max A and stood to pull over hips with mod A. Removed nightgown and donned pull over shirt with min A. Performed stand<>pivot into WC without AD and mod A with pt holding onto therapist's forearms. Pt sat in WC at sink and washed face and brushed teeth with setup assist.   Pt transported to/from room in [facility] dependently for time management purposes. Stood with RW and mod A and ambulated 36ft x 1, 58ft x 1, and 32ft x 1 with RW and mod A overall with target of getting to Christmas tree. Pt required assist to steer RW due to visual deficits and cues to widen BOS as pt almost stepping on top of her feet - limited by fatigue, visual impairments, and pain in  neck. On 2nd trial, pt with posterior LOB and just sat back into WC without warning. Transferred on/off Nustep via stand<>pivot with RW and mod A (again due to posterior bias) and performed seated BUE/BLE strengthening on Nustep at workload 1 increasing to 2 for 7 minutes with emphasis on cardiovascular endurance and reciprocal movement training - total of 207 steps, 0.1 miles, 29 SPM, and 1.3 METs. While on Nustep discussed follow up therapy options - pt ultimately preferring HHPT. Returned to room and concluded session with pt sitting in [facility] with all needs within reach and daughter at bedside.   Therapy Documentation Precautions:  Precautions Precautions: Fall Precaution/Restrictions Comments: R hemiparesis Restrictions Weight Bearing Restrictions Per Provider Order: No  Therapy/Group: Individual Therapy Therisa HERO Zaunegger Therisa Stains PT, DPT 05/04/2024, 6:49 AM

## 2024-05-04 NOTE — Progress Notes (Signed)
 "                                                        PROGRESS NOTE   Subjective/Complaints: Daughter would like CBG checked HS before Lantus  is given as patient has history of hypoglycemia, edited Lantus  order to indicate this  ROS: nausea improved, constipation improved   Objective:   No results found. Recent Labs    05/03/24 0526 05/04/24 0623  WBC 6.2 5.8  HGB 8.8* 8.8*  HCT 27.3* 27.0*  PLT 150 150   Recent Labs    05/04/24 0623  NA 135  K 4.9  CL 97*  CO2 28  GLUCOSE 112*  BUN 34*  CREATININE 5.93*  CALCIUM  9.6     Intake/Output Summary (Last 24 hours) at 05/04/2024 1154 Last data filed at 05/03/2024 1402 Gross per 24 hour  Intake 60 ml  Output --  Net 60 ml     Wound 04/30/24 1723 Pressure Injury Sacrum Left Stage 2 -  Partial thickness loss of dermis presenting as a shallow open injury with a red, pink wound bed without slough. (Active)    Physical Exam: Vital Signs Blood pressure (!) 150/68, pulse 81, temperature 98.5 F (36.9 C), temperature source Oral, resp. rate 18, height 5' 2.4 (1.585 m), weight 46.2 kg, SpO2 98%.  Gen: no distress, normal appearing HEENT: oral mucosa pink and moist, NCAT Cardio: Reg rate Chest: normal effort, normal rate of breathing Abd: soft, non-distended Ext: no edema Psych: pleasant, normal affect Skin: intact Neuro: +generalized muscle wasting, +ataxic speech- improving, 4-/5 strength on the right side, otherwise strength is 5/5, stable 12/19    Assessment/Plan: 1. Functional deficits which require 3+ hours per day of interdisciplinary therapy in a comprehensive inpatient rehab setting. Physiatrist is providing close team supervision and 24 hour management of active medical problems listed below. Physiatrist and rehab team continue to assess barriers to discharge/monitor patient progress toward functional and medical goals  Care Tool:  Bathing    Body parts bathed by patient: Right arm, Chest, Abdomen,  Right upper leg, Left upper leg, Face   Body parts bathed by helper: Left arm, Buttocks, Front perineal area, Right lower leg, Left lower leg     Bathing assist Assist Level: Maximal Assistance - Patient 24 - 49%     Upper Body Dressing/Undressing Upper body dressing   What is the patient wearing?: Hospital gown only    Upper body assist Assist Level: Maximal Assistance - Patient 25 - 49%    Lower Body Dressing/Undressing Lower body dressing      What is the patient wearing?: Underwear/pull up     Lower body assist Assist for lower body dressing: Total Assistance - Patient < 25%     Toileting Toileting    Toileting assist Assist for toileting: Maximal Assistance - Patient 25 - 49%     Transfers Chair/bed transfer  Transfers assist     Chair/bed transfer assist level: Moderate Assistance - Patient 50 - 74%     Locomotion Ambulation   Ambulation assist      Assist level: Moderate Assistance - Patient 50 - 74% Assistive device: Walker-rolling Max distance: 88ft   Walk 10 feet activity   Assist  Walk 10 feet activity did not occur: Safety/medical concerns (fatigue, weakness/deconditioning)  Assist level: Moderate  Assistance - Patient - 50 - 74% Assistive device: Walker-rolling   Walk 50 feet activity   Assist Walk 50 feet with 2 turns activity did not occur: Safety/medical concerns (fatigue, weakness/deconditioning)  Assist level: Moderate Assistance - Patient - 50 - 74% Assistive device: Walker-Eva    Walk 150 feet activity   Assist Walk 150 feet activity did not occur: Safety/medical concerns         Walk 10 feet on uneven surface  activity   Assist Walk 10 feet on uneven surfaces activity did not occur: Safety/medical concerns (fatigue, weakness/deconditioning)         Wheelchair     Assist Is the patient using a wheelchair?: Yes Type of Wheelchair: Manual    Wheelchair assist level: Supervision/Verbal cueing Max  wheelchair distance: 28'    Wheelchair 50 feet with 2 turns activity    Assist        Assist Level: Supervision/Verbal cueing   Wheelchair 150 feet activity     Assist      Assist Level: Maximal Assistance - Patient 25 - 49%   Blood pressure (!) 150/68, pulse 81, temperature 98.5 F (36.9 C), temperature source Oral, resp. rate 18, height 5' 2.4 (1.585 m), weight 46.2 kg, SpO2 98%.  Medical Problem List and Plan: 1. Functional deficits secondary to RIght dorsal midbrain infarct             -patient may  shower             -ELOS/Goals: 10-14d sup/minA  Chart and therapy notes reviewed, continue CIR, discussed patient's progress with therapy Grounds pass ordered Vitamin D3/Metanx/Vitamin B/C complex ordered F/u with me or Fidela in clinic within 1 month Would benefit from home health aide upon discharge  This patient is capable of making decisions on her own behalf. Updated daughter  2.  Anemia: Heparin  d/ced due to bleeding from fistula site, Hgb reviewed and is now stable, stool occult reviewed and is negative             -antiplatelet therapy: ASA/Ticagrelor   3. H/o Sacroiliitis: Tylenol  prn mild pain. Decrease Norco to 1 tab daily prn, lidocaine  patch ordered  4. Insomnia: continue prn melatonin   5. T2DM:  Hgb A1c-  8.5. Monitor BS ac/hs and use SSI for elevated BS.              --was on insulin  glargine 12 units w/ 5 units novolog  TID. Increase glargine to 11U but patient refuses this dose and wants 10U due to past experiences with hypoglycemia- changed back             --will continue to monitor BS for trend and titrate insulin  as needed.   6. ESRD: HD MWF at the end of the day to help with tolerance of therapy.              --Phoslo  tid ac for metabolic bone disease.   Continue fluid restriction  7. Hypotension: decrease amlodipine  to 2.5mg , continue coreg , hydralazine  and imdur .  8. H/o Chorioretinitis: continue azithropine daily. .   9.  Constipation: LBM 12/18  10. GERD: continue pepcid   LOS: 4 days A FACE TO FACE EVALUATION WAS PERFORMED  Kadesia Robel P Reiss Mowrey 05/04/2024, 11:54 AM     "

## 2024-05-04 NOTE — Progress Notes (Signed)
 Occupational Therapy Session Note  Patient Details  Name: Hannah [name] MRN: [id number] Date of Birth: 1965/06/18  Today's Date: 05/04/2024 OT Individual Time: 1050-1200 OT Individual Time Calculation (min): 70 min    Short Term Goals: Week 1:  OT Short Term Goal 1 (Week 1): Pt will perform toilet transfers MIN A OT Short Term Goal 2 (Week 1): Pt will perform LB dressing MOD A OT Short Term Goal 3 (Week 1): Pt will improve sitting at EOB for 10 minutes to perform ADL task  Skilled Therapeutic Interventions/Progress Updates:  Skilled OT intervention completed with focus on functional transfers, dynamic sitting/standing balance, RUE NMR. Pt received seated in w/c, agreeable to session. Reported fatigue and intermittent RUE shoulder pain reported; declined meds however education provided on heat for intervention. OT offered rest breaks and repositioning throughout for pain reduction.  Transported dependently <> gym via w/c. Completed min A squat pivot from w/c <> EOM with cues for hemi technique and positioning. Able to static sit with supervision, CGA during dynamic sitting tasks. To address RUE NMR, trunk control and dynamic balance, pt reached in PNF pattern bilaterally to place/retrieve squigz on mirror in seated position. RUE shoulder flexion limited to 90 degrees, but with diminished ability to grasp with digits during task. Pt required frequent rest breaks due to fatigue from lack of sleep and also frequent easy distractions in loud environment. Transitioned to sit > stand activity to place horse shoes on mirror with mod A on R side for hemi deficits. Required cues for anterior weight shifting, weight bearing through BLE and for trunk extension in stance. Frequent breaks required again for fatigue.  Back in room, pt requested to return to bed. Completed mod A stand pivot > R with cues for positioning. Dependent for footwear, CGA sit > supine using bed rail. Pt remained semi upright in bed,  with bed alarm on/activated, and with all needs in reach at end of session.   Therapy Documentation Precautions:  Precautions Precautions: Fall Precaution/Restrictions Comments: R hemiparesis Restrictions Weight Bearing Restrictions Per Provider Order: No    Therapy/Group: Individual Therapy  Lorrayne FORBES Fritter, MS, OTR/L  05/04/2024, 12:09 PM

## 2024-05-04 NOTE — Progress Notes (Signed)
" °   05/04/24 1734  Vitals  Temp 97.6 F (36.4 C)  Temp Source Oral  BP (!) 135/58  BP Location Left Arm  BP Method Automatic  Patient Position (if appropriate) Lying  Pulse Rate 95  Pulse Rate Source Monitor  ECG Heart Rate 93  Resp 18  Oxygen Therapy  SpO2 95 %  O2 Device Room Air  Patient Activity (if Appropriate) In bed  Pulse Oximetry Type Continuous  Oximetry Probe Site Changed No  During Treatment Monitoring  Blood Flow Rate (mL/min) 350 mL/min  Arterial Pressure (mmHg) -196.96 mmHg  Venous Pressure (mmHg) 230.7 mmHg  TMP (mmHg) 29.89 mmHg  Ultrafiltration Rate (mL/min) 1981 mL/min  Dialysate Flow Rate (mL/min) 300 ml/min  Dialysate Potassium Concentration 2  Dialysate Calcium  Concentration 2.5  Duration of HD Treatment -hour(s) 3.48 hour(s)  Cumulative Fluid Removed (mL) per Treatment  918.41  HD Safety Checks Performed Yes  Intra-Hemodialysis Comments Tolerated well;Tx completed  Post Treatment  Dialyzer Clearance Lightly streaked  Tolerated HD Treatment Yes  AVG/AVF Arterial Site Held (minutes) 6 minutes  AVG/AVF Venous Site Held (minutes) 5 minutes  Fistula / Graft Right Upper arm Arteriovenous fistula  Placement Date/Time: 11/24/20 0812   Placed prior to admission: No  Orientation: Right  Access Location: Upper arm  Access Type: (c) Arteriovenous fistula  Site Condition No complications  Fistula / Graft Assessment Present;Thrill;Bruit  Status Deaccessed  Drainage Description None    "

## 2024-05-05 LAB — GLUCOSE, CAPILLARY
Glucose-Capillary: 204 mg/dL — ABNORMAL HIGH (ref 70–99)
Glucose-Capillary: 349 mg/dL — ABNORMAL HIGH (ref 70–99)
Glucose-Capillary: 239 mg/dL — ABNORMAL HIGH (ref 70–99)
Glucose-Capillary: 194 mg/dL — ABNORMAL HIGH (ref 70–99)
Glucose-Capillary: 191 mg/dL — ABNORMAL HIGH (ref 70–99)

## 2024-05-05 MED ORDER — MELATONIN 5 MG PO TABS
5.0000 mg | ORAL_TABLET | Freq: Every evening | ORAL | Status: DC | PRN
  Administered 2024-05-05 – 2024-05-12 (×8): 5 mg via ORAL
  Filled 2024-05-05 (×8): qty 1

## 2024-05-05 MED ORDER — AMLODIPINE BESYLATE 10 MG PO TABS
10.0000 mg | ORAL_TABLET | Freq: Every day | ORAL | Status: DC
  Administered 2024-05-06 – 2024-05-08 (×3): 10 mg via ORAL
  Filled 2024-05-05 (×3): qty 1

## 2024-05-05 MED ORDER — CHLORHEXIDINE GLUCONATE CLOTH 2 % EX PADS
6.0000 | MEDICATED_PAD | Freq: Every day | CUTANEOUS | Status: DC
  Administered 2024-05-06 – 2024-05-13 (×7): 6 via TOPICAL

## 2024-05-05 NOTE — Plan of Care (Signed)
" °  Problem: Consults Goal: RH STROKE PATIENT EDUCATION Description: See Patient Education module for education specifics  Outcome: Progressing Goal: Diabetes Guidelines if Diabetic/Glucose > 140 Description: If diabetic or lab glucose is > 140 mg/dl - Initiate Diabetes/Hyperglycemia Guidelines & Document Interventions  Outcome: Progressing   Problem: RH BOWEL ELIMINATION Goal: RH STG MANAGE BOWEL WITH ASSISTANCE Description: STG Manage Bowel with mod I Assistance. Outcome: Progressing Goal: RH STG MANAGE BOWEL W/MEDICATION W/ASSISTANCE Description: STG Manage Bowel with Medication with mod I Assistance. Outcome: Progressing   Problem: RH BLADDER ELIMINATION Goal: RH STG MANAGE BLADDER WITH ASSISTANCE Description: STG Manage Bladder With toileting Assistance Outcome: Progressing   Problem: RH SAFETY Goal: RH STG ADHERE TO SAFETY PRECAUTIONS W/ASSISTANCE/DEVICE Description: STG Adhere to Safety Precautions With cues Assistance/Device. Outcome: Progressing   Problem: RH KNOWLEDGE DEFICIT Goal: RH STG INCREASE KNOWLEDGE OF DIABETES Description: Patient and dtr will be able to manage DM using educational resources for medications and dietary modification independently Outcome: Progressing Goal: RH STG INCREASE KNOWLEDGE OF HYPERTENSION Description: Patient and dtr will be able to manage HTN using educational resources for medications and dietary modification independently Outcome: Progressing Goal: RH STG INCREASE KNOWLEGDE OF HYPERLIPIDEMIA Description: Patient and dtr will be able to manage HLD using educational resources for medications and dietary modification independently Outcome: Progressing Goal: RH STG INCREASE KNOWLEDGE OF STROKE PROPHYLAXIS Description: Patient and dtr will be able to manage secondary risks using educational resources for medications and dietary modification independently Outcome: Progressing

## 2024-05-05 NOTE — Progress Notes (Signed)
 "                                                        PROGRESS NOTE   Subjective/Complaints: No new complaints today Nephrology note reviewed and she tolerated dialysis well yesterday Tolerated SLP well today   ROS: nausea improved, constipation improved, +insomnia   Objective:   No results found. Recent Labs    05/03/24 0526 05/04/24 0623  WBC 6.2 5.8  HGB 8.8* 8.8*  HCT 27.3* 27.0*  PLT 150 150   Recent Labs    05/04/24 0623  NA 135  K 4.9  CL 97*  CO2 28  GLUCOSE 112*  BUN 34*  CREATININE 5.93*  CALCIUM  9.6     Intake/Output Summary (Last 24 hours) at 05/05/2024 1648 Last data filed at 05/05/2024 9147 Gross per 24 hour  Intake 240 ml  Output --  Net 240 ml     Wound 04/30/24 1723 Pressure Injury Sacrum Left Stage 2 -  Partial thickness loss of dermis presenting as a shallow open injury with a red, pink wound bed without slough. (Active)    Physical Exam: Vital Signs Blood pressure (!) 139/59, pulse 88, temperature 98.7 F (37.1 C), temperature source Oral, resp. rate 16, height 5' 2.4 (1.585 m), weight 46.2 kg, SpO2 100%.  Gen: no distress, normal appearing HEENT: oral mucosa pink and moist, NCAT Cardio: Reg rate Chest: normal effort, normal rate of breathing Abd: soft, non-distended Ext: no edema Psych: pleasant, normal affect Skin: intact Neuro: +generalized muscle wasting, +ataxic speech- improving, 4-/5 strength on the right side, otherwise strength is 5/5, stable 12/20    Assessment/Plan: 1. Functional deficits which require 3+ hours per day of interdisciplinary therapy in a comprehensive inpatient rehab setting. Physiatrist is providing close team supervision and 24 hour management of active medical problems listed below. Physiatrist and rehab team continue to assess barriers to discharge/monitor patient progress toward functional and medical goals  Care Tool:  Bathing    Body parts bathed by patient: Right arm, Chest, Abdomen,  Right upper leg, Left upper leg, Face   Body parts bathed by helper: Left arm, Buttocks, Front perineal area, Right lower leg, Left lower leg     Bathing assist Assist Level: Maximal Assistance - Patient 24 - 49%     Upper Body Dressing/Undressing Upper body dressing   What is the patient wearing?: Hospital gown only    Upper body assist Assist Level: Maximal Assistance - Patient 25 - 49%    Lower Body Dressing/Undressing Lower body dressing      What is the patient wearing?: Underwear/pull up     Lower body assist Assist for lower body dressing: Total Assistance - Patient < 25%     Toileting Toileting    Toileting assist Assist for toileting: Maximal Assistance - Patient 25 - 49%     Transfers Chair/bed transfer  Transfers assist     Chair/bed transfer assist level: Moderate Assistance - Patient 50 - 74%     Locomotion Ambulation   Ambulation assist      Assist level: Moderate Assistance - Patient 50 - 74% Assistive device: Walker-rolling Max distance: 17ft   Walk 10 feet activity   Assist  Walk 10 feet activity did not occur: Safety/medical concerns (fatigue, weakness/deconditioning)  Assist level: Moderate Assistance - Patient -  50 - 74% Assistive device: Walker-rolling   Walk 50 feet activity   Assist Walk 50 feet with 2 turns activity did not occur: Safety/medical concerns (fatigue, weakness/deconditioning)  Assist level: Moderate Assistance - Patient - 50 - 74% Assistive device: Walker-Eva    Walk 150 feet activity   Assist Walk 150 feet activity did not occur: Safety/medical concerns         Walk 10 feet on uneven surface  activity   Assist Walk 10 feet on uneven surfaces activity did not occur: Safety/medical concerns (fatigue, weakness/deconditioning)         Wheelchair     Assist Is the patient using a wheelchair?: Yes Type of Wheelchair: Manual    Wheelchair assist level: Supervision/Verbal cueing Max  wheelchair distance: 29'    Wheelchair 50 feet with 2 turns activity    Assist        Assist Level: Supervision/Verbal cueing   Wheelchair 150 feet activity     Assist      Assist Level: Maximal Assistance - Patient 25 - 49%   Blood pressure (!) 139/59, pulse 88, temperature 98.7 F (37.1 C), temperature source Oral, resp. rate 16, height 5' 2.4 (1.585 m), weight 46.2 kg, SpO2 100%.  Medical Problem List and Plan: 1. Functional deficits secondary to RIght dorsal midbrain infarct             -patient may  shower             -ELOS/Goals: 10-14d sup/minA  Chart and therapy notes reviewed, continue CIR, discussed patient's progress with therapy Grounds pass ordered Vitamin D3/Metanx/Vitamin B/C complex ordered F/u with me or Fidela in clinic within 1 month Would benefit from home health aide upon discharge  This patient is capable of making decisions on her own behalf. Updated daughter  2.  Anemia: Heparin  d/ced due to bleeding from fistula site, Hgb reviewed and is now stable, stool occult reviewed and is negative             -antiplatelet therapy: ASA/Ticagrelor   3. H/o Sacroiliitis: Tylenol  prn mild pain. Decrease Norco to 1 tab daily prn, lidocaine  patch ordered  4. Insomnia: scheduled melatonin 5mg  HS   5. T2DM:  Hgb A1c-  8.5. Monitor BS ac/hs and use SSI for elevated BS.              --was on insulin  glargine 12 units w/ 5 units novolog  TID. Increase glargine to 11U but patient refuses this dose and wants 10U due to past experiences with hypoglycemia- changed back             --will continue to monitor BS for trend and titrate insulin  as needed.   6. ESRD: HD MWF at the end of the day to help with tolerance of therapy.              --Phoslo  tid ac for metabolic bone disease.   Continue fluid restriction  7. Hypertension: increase amlodipine  to 10mg  daily, continue coreg , hydralazine  and imdur .  8. H/o Chorioretinitis: continue azithropine daily. .   9.  Constipation: LBM 12/18- messaged nursing to confirm accuracy  10. GERD: continue pepcid   LOS: 5 days A FACE TO FACE EVALUATION WAS PERFORMED  Sven SHAUNNA Elks 05/05/2024, 4:48 PM     "

## 2024-05-05 NOTE — Progress Notes (Signed)
 " [facility] Progress Note   Subjective:    Seen and examined patient at bedside. Tolerated yesterday's HD with net UF around 1L. No acute issues at this time. Next HD tomorrow per the HD holiday schedule.  Objective Vitals:   05/04/24 1811 05/04/24 1946 05/05/24 0556 05/05/24 1521  BP: (!) 148/63 (!) 155/62 (!) 142/68 (!) 139/59  Pulse: 93 94 95 88  Resp: 18 18 17 16   Temp: 98.7 F (37.1 C) 97.8 F (36.6 C) 98 F (36.7 C) 98.7 F (37.1 C)  TempSrc: Oral Oral Oral Oral  SpO2: 100% 98% 98% 100%  Weight:      Height:       Physical Exam General: Well appearing, NAD. Room air Heart: RRR Lungs: CTA anteriorly Abdomen: soft Extremities: no LE edema Dialysis Access: R AVG +bruit  Filed Weights   05/02/24 1821 05/03/24 0500 05/04/24 0500  Weight: 44.4 kg 50 kg 46.2 kg    Intake/Output Summary (Last 24 hours) at 05/05/2024 1615 Last data filed at 05/05/2024 9147 Gross per 24 hour  Intake 240 ml  Output --  Net 240 ml    Additional Objective Labs: Basic Metabolic Panel: Recent Labs  Lab 04/29/24 0634 04/30/24 0338 05/04/24 0623  NA 134* 133* 135  K 4.5 5.0 4.9  CL 96* 95* 97*  CO2 29 30 28   GLUCOSE 158* 174* 112*  BUN 30* 38* 34*  CREATININE 6.09* 7.91* 5.93*  CALCIUM  9.4 9.0 9.6  PHOS  --  4.7* 4.2   Liver Function Tests: Recent Labs  Lab 04/30/24 0338 05/04/24 0623  AST 13*  --   ALT 8  --   ALKPHOS 38  --   BILITOT 1.0  --   PROT 6.4*  --   ALBUMIN 3.0* 3.7   No results for input(s): LIPASE, AMYLASE in the last 168 hours. CBC: Recent Labs  Lab 04/30/24 0338 04/30/24 2249 05/02/24 0447 05/03/24 0526 05/04/24 0623  WBC 5.3 5.4 5.7 6.2 5.8  NEUTROABS 3.6  --  4.2 4.7 4.3  HGB 9.8* 9.7* 9.4* 8.8* 8.8*  HCT 31.1* 30.2* 29.3* 27.3* 27.0*  MCV 96.9 96.2 96.4 96.5 97.1  PLT 191 191 190 150 150   Blood Culture No results found for: SDES, SPECREQUEST, CULT, REPTSTATUS  Cardiac Enzymes: No results for input(s):  CKTOTAL, CKMB, CKMBINDEX, TROPONINI in the last 168 hours. CBG: Recent Labs  Lab 05/04/24 1646 05/04/24 1804 05/04/24 2055 05/05/24 0557 05/05/24 1140  GLUCAP 128* 152* 240* 204* 349*   Iron Studies: No results for input(s): IRON, TIBC, TRANSFERRIN, FERRITIN in the last 72 hours. Lab Results  Component Value Date   INR 1.1 04/30/2024   INR 1.1 04/23/2024   INR 1.1 09/24/2023   Studies/Results: No results found.  Medications:   amLODipine   5 mg Oral Daily   aspirin  EC  81 mg Oral Daily   azaTHIOprine   50 mg Oral Daily   B-complex with vitamin C  1 tablet Oral Daily   calcium  acetate  667 mg Oral TID WC   carvedilol   25 mg Oral BID WC   Chlorhexidine  Gluconate Cloth  6 each Topical Q0600   cholecalciferol   1,000 Units Oral Daily   famotidine   20 mg Oral QHS   hydrALAZINE   50 mg Oral TID   insulin  aspart  0-5 Units Subcutaneous QHS   insulin  aspart  0-9 Units Subcutaneous TID WC   insulin  glargine  10 Units Subcutaneous Daily   isosorbide  mononitrate  30 mg  Oral QHS   l-methylfolate-B6-B12  1 tablet Oral Daily   lidocaine   1 patch Transdermal Q24H   polyethylene glycol  17 g Oral BID   rosuvastatin   20 mg Oral Daily   ticagrelor   90 mg Oral BID    Dialysis Orders: AF MWF 3:30 BFR 350 2K/2Ca AVG 16g No heparin   -EDW 45.4kg -No ESA  -Hectorol  1 mcg q HD  Assessment/Plan: Acute CVA. H/o prior strokes. 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 Management per neuro. Now transferred to CIR. ESRD.  HD MWF. Next HD tomorrow per the HD holiday schedule. Prolonged bleeding from AVG previously. No bleeding occurred as of 12/12 but noted another episode of bleeding 12/15. No further bleeding since Heparin  SQ has been stopped. Continue to monitor. Consider a F'gram if bleeding persist. Hypertension. Home meds resumed. BP now at goal.  Volume. No evidence of volume overload on exam. Continue UF with HD as able.  Anemia. 8.8, follow and will start ESA if remains  <10 Metabolic bone disease: calcium  and phosphorus controlled. Continue Hectorol , Phoslo  binder  T2DM. Insulin  per primary    FYI -- Holiday Schedule for next week Usual MWF schedule -> Sun (21), Tues (23), Friday (26) Usual TTS schedule -> Mon (22), Wed (24), Sat (27)  Charmaine Piety, NP [facility] 05/05/2024,4:15 PM  LOS: 5 days    "

## 2024-05-05 NOTE — Progress Notes (Signed)
 Occupational Therapy Session Note  Patient Details  Name: Hannah [name] MRN: [id number] Date of Birth: 11-20-1965  {CHL IP REHAB OT TIME CALCULATIONS:[id number]}   Short Term Goals: Week 1:  OT Short Term Goal 1 (Week 1): Pt will perform toilet transfers MIN A OT Short Term Goal 2 (Week 1): Pt will perform LB dressing MOD A OT Short Term Goal 3 (Week 1): Pt will improve sitting at EOB for 10 minutes to perform ADL task  Skilled Therapeutic Interventions/Progress Updates:    Patient agreeable to participate in OT session. Reports *** pain level.   Patient participated in skilled OT session focusing on ***. Therapist facilitated/assessed/developed/educated/integrated/elicited *** in order to improve/facilitate/promote    Therapy Documentation Precautions:  Precautions Precautions: Fall Precaution/Restrictions Comments: R hemiparesis Restrictions Weight Bearing Restrictions Per Provider Order: No  Therapy/Group: Individual Therapy  Leita Howell, OTR/L,CBIS  Supplemental OT - [facility] and [facility] Secure Chat Preferred   05/05/2024, 10:58 PM

## 2024-05-05 NOTE — Progress Notes (Signed)
 Physical Therapy Session Note  Patient Details  Name: Hannah [name] MRN: [id number] Date of Birth: November 27, 1965  Today's Date: 05/05/2024 PT Individual Time: 0903-1011 PT Individual Time Calculation (min): 68 min   Short Term Goals: Week 1:  PT Short Term Goal 1 (Week 1): pt will transfer supine<>sitting EOB with min A consistantly PT Short Term Goal 2 (Week 1): pt will transfer sit<>stand with LRAD and min A consistantly PT Short Term Goal 3 (Week 1): pt will perform bed<>chair transfer with LRAD and min A consistantly  Skilled Therapeutic Interventions/Progress Updates:   Received pt semi-reclined in bed, pt agreeable to PT treatment, and denied any pain during session. Session with emphasis on functional mobility/transfers, dressing, generalized strengthening and endurance, dynamic standing balance/coordination, and gait training. Pt transferred semi-reclined<>sitting R EOB with HOB elevated and use of bedrails with supervision. Removed gown and donned pull over shirt with min A. Donned socks, pants, and shoes sitting EOB with max A and stood with RW and mod A and required max A to pull over hips. Performed stand<>pivot into WC with RW and min A - noted poor eccentric control when sitting and failure to reach back to feel for WC.   Pt transported to/from room in [facility] dependently for time management purposes. Stood with RW and heavy min A in preparation to walk, but pt reported LLE felt weak, and returned to sitting. Performed seated L knee flexion 2x15 and L knee extension 2x15 for active warm up. Then stood again with heavy min A and ambulated 68ft with RW and mod A. Pt demo increased posterior bias, fatigue, difficulty advancing feet, and controlling RW this morning. Pt disappointed in herself not being able to walk as far today. Discussed that pt has made more progress this week than she has in the past year and that her body is most likely exhausted. Took extended rest break then stood final time  with heavy min A and ambulated 56ft with RW and mod A +2 for WC follow with heavy assist to steer RW (pt with tendency to veer L).   Stood x 3 additional trials from [facility] with RW and heavy min A and worked on dynamic standing balance/coordination, anterior weight shifting, and upright posture/gaze tossing horseshoes with LUE and mod A for balance due to posterior lean. Transitioned to seated hip adduction isometrics 10x3 second hold and hip abduction with red TB x15 with emphasis on LE strength. Returned to room and concluded session with pt sitting in [facility] with all needs within reach.  Therapy Documentation Precautions:  Precautions Precautions: Fall Precaution/Restrictions Comments: R hemiparesis Restrictions Weight Bearing Restrictions Per Provider Order: No  Therapy/Group: Individual Therapy Therisa HERO Zaunegger Therisa Stains PT, DPT 05/05/2024, 6:58 AM

## 2024-05-05 NOTE — Progress Notes (Signed)
 Occupational Therapy Session Note  Patient Details  Name: Hannah [name] MRN: [id number] Date of Birth: 05-27-1965  Today's Date: 05/05/2024 OT Individual Time: 1350-1445 OT Individual Time Calculation (min): 55 min    Short Term Goals: Week 1:  OT Short Term Goal 1 (Week 1): Pt will perform toilet transfers MIN A OT Short Term Goal 2 (Week 1): Pt will perform LB dressing MOD A OT Short Term Goal 3 (Week 1): Pt will improve sitting at EOB for 10 minutes to perform ADL task  Skilled Therapeutic Interventions/Progress Updates:  Skilled OT intervention completed with focus on family education, functional transfers, DC planning. Pt received seated in w/c c/o pain in buttocks. Notified nursing and present to administer meds. Pt agreeable to session however very delayed and required increased time for all transitions during session and frequent redirection.  Pt's son present with questions regarding interest in hired assist as well as options to make pt comfortable in bed at home. Discussed difference between [facility] and hired aid but plan to inform CSW of request for aide. Per pt, MD ordered air bed for pt comfort. Discussed advantages and disadvantages of an air bed, one being that pt wont have this at home. Education provided on bed positioning in side lying with pillows at specified areas of support, and rotating every 1-2 hrs if awake. Son present to recall recommended placement of pillows. Air bed delivered to room. Attempted to have pt stand and ambulate to bed for transfer however mod A sit > stand with RW and difficulty gaining balance due to heavy posterior bias and trunk flexion. Cues needed for hemi positioning and hand placement, then completed min/mod A squat pivot > R to EOB. Mod A sit > supine. Assisted pt with adjusting air to pt's comfort and placed pillows as previously discussed with pt reporting improved comfort.   Pt remained in R side lying in bed, with bed alarm on/activated, and  with all needs in reach at end of session.   Therapy Documentation Precautions:  Precautions Precautions: Fall Precaution/Restrictions Comments: R hemiparesis Restrictions Weight Bearing Restrictions Per Provider Order: No    Therapy/Group: Individual Therapy  Lorrayne FORBES Fritter, MS, OTR/L  05/05/2024, 2:54 PM

## 2024-05-05 NOTE — Progress Notes (Signed)
 Speech Language Pathology Daily Session Note  Patient Details  Name: Hannah [name] MRN: [id number] Date of Birth: 1966/03/30  Today's Date: 05/05/2024 SLP Individual Time: 0802-0900 SLP Individual Time Calculation (min): 58 min  Short Term Goals: Week 1: SLP Short Term Goal 1 (Week 1): Patient will solve mildly complex structured problems with min assist. SLP Short Term Goal 2 (Week 1): Patient will utilize internal/external memory aids to recall daily information with 80% accuracy given mod assist. SLP Short Term Goal 3 (Week 1): Patient will utilize SLOP speech intelligibility strategies at the phrase level 60% of the time with min assist.  Skilled Therapeutic Interventions:  Patient was seen in am to address cognitive re- training and speech intelligibility. Pt alert upon SLP arrival and agreeable for session. Pt seen by this SLP one other time and seemed slower today requiring additional processing time than in other session. She did endorse a poor night of sleep. Pt oriented to time, place, and situation. Pt unaware of therapy scheduled for today thus SLP provided and external aid and reviewed therapy schedule. Pt challenged to recall information throughout session. She recalled schedule with min A in multiple opportunities across session. In other minutes of session, SLP introduced 'BE FAST' stroke symptoms. Pt able to recall 2 symptoms later in session with min A. Pt's speech intelligibility was ~80% in sentences. At conclusion of session, pt was left upright in bed with call button within reach. SLP to continue POC.   Pain    Therapy/Group: Individual Therapy  Joane GORMAN Fuss 05/05/2024, 12:35 PM

## 2024-05-06 LAB — GLUCOSE, CAPILLARY
Glucose-Capillary: 74 mg/dL (ref 70–99)
Glucose-Capillary: 143 mg/dL — ABNORMAL HIGH (ref 70–99)
Glucose-Capillary: 171 mg/dL — ABNORMAL HIGH (ref 70–99)
Glucose-Capillary: 240 mg/dL — ABNORMAL HIGH (ref 70–99)

## 2024-05-06 LAB — RENAL FUNCTION PANEL
Albumin: 3.6 g/dL (ref 3.5–5.0)
Anion gap: 8 (ref 5–15)
BUN: 36 mg/dL — ABNORMAL HIGH (ref 6–20)
CO2: 29 mmol/L (ref 22–32)
Calcium: 9.3 mg/dL (ref 8.9–10.3)
Chloride: 98 mmol/L (ref 98–111)
Creatinine, Ser: 5.67 mg/dL — ABNORMAL HIGH (ref 0.44–1.00)
GFR, Estimated: 8 mL/min — ABNORMAL LOW
Glucose, Bld: 73 mg/dL (ref 70–99)
Phosphorus: 4 mg/dL (ref 2.5–4.6)
Potassium: 4.8 mmol/L (ref 3.5–5.1)
Sodium: 136 mmol/L (ref 135–145)

## 2024-05-06 LAB — CBC WITH DIFFERENTIAL/PLATELET
Abs Immature Granulocytes: 0.02 K/uL (ref 0.00–0.07)
Basophils Absolute: 0.1 K/uL (ref 0.0–0.1)
Basophils Relative: 1 %
Eosinophils Absolute: 0.2 K/uL (ref 0.0–0.5)
Eosinophils Relative: 5 %
HCT: 26.1 % — ABNORMAL LOW (ref 36.0–46.0)
Hemoglobin: 8.5 g/dL — ABNORMAL LOW (ref 12.0–15.0)
Immature Granulocytes: 0 %
Lymphocytes Relative: 16 %
Lymphs Abs: 0.8 K/uL (ref 0.7–4.0)
MCH: 31.5 pg (ref 26.0–34.0)
MCHC: 32.6 g/dL (ref 30.0–36.0)
MCV: 96.7 fL (ref 80.0–100.0)
Monocytes Absolute: 0.2 K/uL (ref 0.1–1.0)
Monocytes Relative: 4 %
Neutro Abs: 3.6 K/uL (ref 1.7–7.7)
Neutrophils Relative %: 74 %
Platelets: 137 K/uL — ABNORMAL LOW (ref 150–400)
RBC: 2.7 MIL/uL — ABNORMAL LOW (ref 3.87–5.11)
RDW: 16 % — ABNORMAL HIGH (ref 11.5–15.5)
WBC: 4.9 K/uL (ref 4.0–10.5)
nRBC: 1.2 % — ABNORMAL HIGH (ref 0.0–0.2)

## 2024-05-06 MED ORDER — HEPARIN SODIUM (PORCINE) 1000 UNIT/ML DIALYSIS: 1000.0000 [IU] | INTRAMUSCULAR | Status: DC | PRN

## 2024-05-06 MED ORDER — LIDOCAINE-PRILOCAINE 2.5-2.5 % EX CREA: 1.0000 | TOPICAL_CREAM | CUTANEOUS | Status: DC | PRN

## 2024-05-06 MED ORDER — ALTEPLASE 2 MG IJ SOLR: 2.0000 mg | Freq: Once | INTRAMUSCULAR | Status: DC | PRN

## 2024-05-06 MED ORDER — LIDOCAINE HCL (PF) 1 % IJ SOLN: 5.0000 mL | INTRAMUSCULAR | Status: DC | PRN

## 2024-05-06 MED ORDER — PENTAFLUOROPROP-TETRAFLUOROETH EX AERO: 1.0000 | INHALATION_SPRAY | CUTANEOUS | Status: DC | PRN

## 2024-05-06 MED ORDER — DARBEPOETIN ALFA 40 MCG/0.4ML IJ SOSY
40.0000 ug | PREFILLED_SYRINGE | INTRAMUSCULAR | Status: DC
  Administered 2024-05-12: 40 ug via SUBCUTANEOUS
  Filled 2024-05-06 (×2): qty 0.4

## 2024-05-06 MED ORDER — SORBITOL 70 % SOLN
30.0000 mL | Freq: Once | Status: AC
  Administered 2024-05-06: 30 mL via ORAL
  Filled 2024-05-06: qty 30

## 2024-05-06 NOTE — Progress Notes (Signed)
 500 ml ultrafiltration, condition stable post hemodialysis, report given to the RN assuming care, and patient transported without complication to 4W20C.

## 2024-05-06 NOTE — Progress Notes (Addendum)
 " [facility] Progress Note   Subjective:    Seen and examined patient at bedside. No issues. Plan for HD this afternoon.  Objective Vitals:   05/06/24 0510 05/06/24 1338 05/06/24 1347 05/06/24 1400  BP:  (!) 140/64 130/68 (!) 144/61  Pulse:  98 80 81  Resp:  12 14 10   Temp:  97.8 F (36.6 C)    TempSrc:      SpO2:  99% 100% 100%  Weight: 46.1 kg 48.5 kg    Height:       Physical Exam General: Well appearing, NAD. Room air Heart: RRR Lungs: CTA anteriorly Abdomen: soft Extremities: no LE edema Dialysis Access: R AVG +bruit  Filed Weights   05/04/24 0500 05/06/24 0510 05/06/24 1338  Weight: 46.2 kg 46.1 kg 48.5 kg    Intake/Output Summary (Last 24 hours) at 05/06/2024 1402 Last data filed at 05/06/2024 1320 Gross per 24 hour  Intake 600 ml  Output --  Net 600 ml    Additional Objective Labs: Basic Metabolic Panel: Recent Labs  Lab 04/30/24 0338 05/04/24 0623 05/06/24 0630  NA 133* 135 136  K 5.0 4.9 4.8  CL 95* 97* 98  CO2 30 28 29   GLUCOSE 174* 112* 73  BUN 38* 34* 36*  CREATININE 7.91* 5.93* 5.67*  CALCIUM  9.0 9.6 9.3  PHOS 4.7* 4.2 4.0   Liver Function Tests: Recent Labs  Lab 04/30/24 0338 05/04/24 0623 05/06/24 0630  AST 13*  --   --   ALT 8  --   --   ALKPHOS 38  --   --   BILITOT 1.0  --   --   PROT 6.4*  --   --   ALBUMIN 3.0* 3.7 3.6   No results for input(s): LIPASE, AMYLASE in the last 168 hours. CBC: Recent Labs  Lab 04/30/24 2249 05/02/24 0447 05/03/24 0526 05/04/24 0623 05/06/24 0630  WBC 5.4 5.7 6.2 5.8 4.9  NEUTROABS  --  4.2 4.7 4.3 3.6  HGB 9.7* 9.4* 8.8* 8.8* 8.5*  HCT 30.2* 29.3* 27.3* 27.0* 26.1*  MCV 96.2 96.4 96.5 97.1 96.7  PLT 191 190 150 150 137*   Blood Culture No results found for: SDES, SPECREQUEST, CULT, REPTSTATUS  Cardiac Enzymes: No results for input(s): CKTOTAL, CKMB, CKMBINDEX, TROPONINI in the last 168 hours. CBG: Recent Labs  Lab 05/05/24 1704  05/05/24 2028 05/05/24 2144 05/06/24 0531 05/06/24 1118  GLUCAP 239* 194* 191* 74 143*   Iron Studies: No results for input(s): IRON, TIBC, TRANSFERRIN, FERRITIN in the last 72 hours. Lab Results  Component Value Date   INR 1.1 04/30/2024   INR 1.1 04/23/2024   INR 1.1 09/24/2023   Studies/Results: No results found.  Medications:   amLODipine   10 mg Oral Daily   aspirin  EC  81 mg Oral Daily   azaTHIOprine   50 mg Oral Daily   B-complex with vitamin C  1 tablet Oral Daily   calcium  acetate  667 mg Oral TID WC   carvedilol   25 mg Oral BID WC   Chlorhexidine  Gluconate Cloth  6 each Topical Q0600   cholecalciferol   1,000 Units Oral Daily   famotidine   20 mg Oral QHS   hydrALAZINE   50 mg Oral TID   insulin  aspart  0-5 Units Subcutaneous QHS   insulin  aspart  0-9 Units Subcutaneous TID WC   insulin  glargine  10 Units Subcutaneous Daily   isosorbide  mononitrate  30 mg Oral QHS   l-methylfolate-B6-B12  1 tablet  Oral Daily   lidocaine   1 patch Transdermal Q24H   polyethylene glycol  17 g Oral BID   rosuvastatin   20 mg Oral Daily   ticagrelor   90 mg Oral BID    Dialysis Orders: AF MWF 3:30 BFR 350 2K/2Ca AVG 16g No heparin   -EDW 45.4kg -No ESA  -Hectorol  1 mcg q HD  Assessment/Plan: Acute CVA. H/o prior strokes. 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 Management per neuro. Now transferred to CIR. ESRD.  HD MWF. Next HD today per the HD holiday schedule. Prolonged bleeding from AVG previously. No bleeding occurred as of 12/12 but noted another episode of bleeding 12/15. No further bleeding since Heparin  SQ has been stopped. Continue to monitor. Consider a F'gram if bleeding persist. Hypertension. Home meds resumed. BP now at goal.  Volume. No evidence of volume overload on exam. Continue UF with HD as able.  Anemia. 8.5, started ESA today. 1st dose scheduled on 12/26 per pharmacy protocol. Metabolic bone disease: calcium  and phosphorus controlled. Continue Hectorol ,  Phoslo  binder  T2DM. Insulin  per primary    FYI -- Holiday Schedule for next week Usual MWF schedule -> Sun (21), Tues (23), Friday (26) Usual TTS schedule -> Mon (22), Wed (24), Sat (27)  Charmaine Piety, NP [facility] 05/06/2024,2:02 PM  LOS: 6 days    "

## 2024-05-06 NOTE — Progress Notes (Signed)
 "                                                        PROGRESS NOTE   Subjective/Complaints: No new complaints this morning Patient's chart reviewed- No issues reported overnight Vitals signs stable     ROS: nausea improved, constipation improved, +insomnia, denies pain   Objective:   No results found. Recent Labs    05/04/24 0623 05/06/24 0630  WBC 5.8 4.9  HGB 8.8* 8.5*  HCT 27.0* 26.1*  PLT 150 137*   Recent Labs    05/04/24 0623 05/06/24 0630  NA 135 136  K 4.9 4.8  CL 97* 98  CO2 28 29  GLUCOSE 112* 73  BUN 34* 36*  CREATININE 5.93* 5.67*  CALCIUM  9.6 9.3     Intake/Output Summary (Last 24 hours) at 05/06/2024 1358 Last data filed at 05/06/2024 1320 Gross per 24 hour  Intake 600 ml  Output --  Net 600 ml     Wound 04/30/24 1723 Pressure Injury Sacrum Left Stage 2 -  Partial thickness loss of dermis presenting as a shallow open injury with a red, pink wound bed without slough. (Active)    Physical Exam: Vital Signs Blood pressure 130/68, pulse 80, temperature 97.8 F (36.6 C), resp. rate 14, height 5' 2.4 (1.585 m), weight 48.5 kg, SpO2 100%.  Gen: no distress, normal appearing HEENT: oral mucosa pink and moist, NCAT Cardio: Reg rate Chest: normal effort, normal rate of breathing Abd: soft, non-distended Ext: no edema Psych: pleasant, normal affect Skin: intact Neuro: +generalized muscle wasting, +ataxic speech- improving, 4-/5 strength on the right side, otherwise strength is 5/5, stable 12/21    Assessment/Plan: 1. Functional deficits which require 3+ hours per day of interdisciplinary therapy in a comprehensive inpatient rehab setting. Physiatrist is providing close team supervision and 24 hour management of active medical problems listed below. Physiatrist and rehab team continue to assess barriers to discharge/monitor patient progress toward functional and medical goals  Care Tool:  Bathing    Body parts bathed by patient:  Right arm, Chest, Abdomen, Right upper leg, Left upper leg, Face   Body parts bathed by helper: Left arm, Buttocks, Front perineal area, Right lower leg, Left lower leg     Bathing assist Assist Level: Maximal Assistance - Patient 24 - 49%     Upper Body Dressing/Undressing Upper body dressing   What is the patient wearing?: Hospital gown only    Upper body assist Assist Level: Maximal Assistance - Patient 25 - 49%    Lower Body Dressing/Undressing Lower body dressing      What is the patient wearing?: Underwear/pull up     Lower body assist Assist for lower body dressing: Total Assistance - Patient < 25%     Toileting Toileting    Toileting assist Assist for toileting: Maximal Assistance - Patient 25 - 49%     Transfers Chair/bed transfer  Transfers assist     Chair/bed transfer assist level: Moderate Assistance - Patient 50 - 74%     Locomotion Ambulation   Ambulation assist      Assist level: Moderate Assistance - Patient 50 - 74% Assistive device: Walker-rolling Max distance: 22ft   Walk 10 feet activity   Assist  Walk 10 feet activity did not occur: Safety/medical concerns (fatigue,  weakness/deconditioning)  Assist level: Moderate Assistance - Patient - 50 - 74% Assistive device: Walker-rolling   Walk 50 feet activity   Assist Walk 50 feet with 2 turns activity did not occur: Safety/medical concerns (fatigue, weakness/deconditioning)  Assist level: Moderate Assistance - Patient - 50 - 74% Assistive device: Walker-Eva    Walk 150 feet activity   Assist Walk 150 feet activity did not occur: Safety/medical concerns         Walk 10 feet on uneven surface  activity   Assist Walk 10 feet on uneven surfaces activity did not occur: Safety/medical concerns (fatigue, weakness/deconditioning)         Wheelchair     Assist Is the patient using a wheelchair?: Yes Type of Wheelchair: Manual    Wheelchair assist level:  Supervision/Verbal cueing Max wheelchair distance: 11'    Wheelchair 50 feet with 2 turns activity    Assist        Assist Level: Supervision/Verbal cueing   Wheelchair 150 feet activity     Assist      Assist Level: Maximal Assistance - Patient 25 - 49%   Blood pressure 130/68, pulse 80, temperature 97.8 F (36.6 C), resp. rate 14, height 5' 2.4 (1.585 m), weight 48.5 kg, SpO2 100%.  Medical Problem List and Plan: 1. Functional deficits secondary to RIght dorsal midbrain infarct             -patient may  shower             -ELOS/Goals: 10-14d sup/minA  Chart and therapy notes reviewed, continue CIR, discussed patient's progress with therapy Grounds pass ordered Vitamin D3/Metanx/Vitamin B/C complex ordered F/u with me or Fidela in clinic within 1 month Would benefit from home health aide upon discharge  This patient is capable of making decisions on her own behalf. Updated daughter  2.  Anemia: Heparin  d/ced due to bleeding from fistula site, Hgb reviewed and is now stable, stool occult reviewed and is negative             -antiplatelet therapy: ASA/Ticagrelor   3. H/o Sacroiliitis: Tylenol  prn mild pain. Decrease Norco to 1 tab daily prn, lidocaine  patch ordered  4. Insomnia: scheduled melatonin 5mg  HS   5. T2DM:  Hgb A1c-  8.5. Monitor BS ac/hs and use SSI for elevated BS.              --was on insulin  glargine 12 units w/ 5 units novolog  TID. Increase glargine to 11U but patient refuses this dose and wants 10U due to past experiences with hypoglycemia- changed back             --will continue to monitor BS for trend and titrate insulin  as needed.   6. ESRD: HD MWF at the end of the day to help with tolerance of therapy.              --Phoslo  tid ac for metabolic bone disease.   Continue fluid restriction  7. Hypertension: increase amlodipine  to 10mg  daily, continue coreg , hydralazine  and imdur .  8. H/o Chorioretinitis: continue azithropine daily. .   9.  Constipation: LBM 12/18- sorbitol  ordered 12/21  10. GERD: continue pepcid   LOS: 6 days A FACE TO FACE EVALUATION WAS PERFORMED  Hannah [name] Hannah [name] 05/06/2024, 1:58 PM     "

## 2024-05-06 NOTE — Progress Notes (Signed)
 Speech Language Pathology Daily Session Note  Patient Details  Name: Hannah [name] MRN: [id number] Date of Birth: 07-15-1965  Today's Date: 05/06/2024 SLP Individual Time: 9147-9063 SLP Individual Time Calculation (min): 44 min  Short Term Goals: Week 1: SLP Short Term Goal 1 (Week 1): Patient will solve mildly complex structured problems with min assist. SLP Short Term Goal 2 (Week 1): Patient will utilize internal/external memory aids to recall daily information with 80% accuracy given mod assist. SLP Short Term Goal 3 (Week 1): Patient will utilize SLOP speech intelligibility strategies at the phrase level 60% of the time with min assist.  Skilled Therapeutic Interventions: Pt seen for skilled SLP session to address motor speech goals. SLP prompted various questions for informal conversation and prompted structured speech tasks to encourage verbal output. Runnning speech observed to be ~85-90% intelligibile to this listener. Per prior SLP notes, pt may be at or very nerar baseline. Defer to primary SLP for further management of SLP PoC. Pt left sitting upright in bed with bed alarm activated and call bell in reach.   Pain Pain Assessment Pain Scale: 0-10 Pain Score: 0-No pain  Therapy/Group: Individual Therapy  [name] 05/06/2024, 9:42 AM

## 2024-05-07 DIAGNOSIS — F09 Unspecified mental disorder due to known physiological condition: Secondary | ICD-10-CM

## 2024-05-07 LAB — GLUCOSE, CAPILLARY
Glucose-Capillary: 112 mg/dL — ABNORMAL HIGH (ref 70–99)
Glucose-Capillary: 122 mg/dL — ABNORMAL HIGH (ref 70–99)
Glucose-Capillary: 246 mg/dL — ABNORMAL HIGH (ref 70–99)
Glucose-Capillary: 229 mg/dL — ABNORMAL HIGH (ref 70–99)

## 2024-05-07 MED ORDER — SORBITOL 70 % SOLN: 30.0000 mL | Freq: Once | Status: DC

## 2024-05-07 MED ORDER — FLEET ENEMA RE ENEM: 1.0000 | ENEMA | Freq: Once | RECTAL | Status: DC

## 2024-05-07 MED ORDER — DARBEPOETIN ALFA 40 MCG/0.4ML IJ SOSY: 40.0000 ug | PREFILLED_SYRINGE | INTRAMUSCULAR | Status: AC

## 2024-05-07 MED ORDER — FLEET ENEMA RE ENEM: 1.0000 | ENEMA | Freq: Every day | RECTAL | Status: DC | PRN

## 2024-05-07 NOTE — Progress Notes (Signed)
 Physical Therapy Session Note  Patient Details  Name: Hannah [name] MRN: [id number] Date of Birth: 01-10-1966  Today's Date: 05/07/2024 PT Individual Time: 0803-0905 PT Individual Time Calculation (min): 62 min   Short Term Goals: Week 1:  PT Short Term Goal 1 (Week 1): pt will transfer supine<>sitting EOB with min A consistantly PT Short Term Goal 2 (Week 1): pt will transfer sit<>stand with LRAD and min A consistantly PT Short Term Goal 3 (Week 1): pt will perform bed<>chair transfer with LRAD and min A consistantly   Skilled Therapeutic Interventions/Progress Updates:  Patient supine in air mattress bed on entrance to room. Patient alert and agreeable to PT session.   Patient with initially complains of no pain but nausea at start of session. Also relates discomfort from need to have BM but bowels are not moving.   On morning rounding with MD, relates need to have BM but has not had lucj with sorbitol  yet. MD recommends another dose , however pt feels this is causing her nausea. MD relates next step of enema which pt is open to. MD notifies RN for admin after lunch and when all therapy is completed for day.   Pt's dtr enters room. Pulls outfit for day.   Therapeutic Activity: Bed Mobility: Pt performed supine > sit from elevated HOB with MinA d/t LOB to R side. VC/tc provided for technique and required lean to reattain balance. Pt is able to initiate donning clothes and requires ModA for setup of sweater but once RUE is donned, she completes the rest with CGA. Pants require ModA to thread legs and pull up to thighs. Then requires MaxA to stand at EOB in order to complete but is able to pull up with LUE. Transfers: Squat pivot transfer attempted from bed to w/c but pt rises to stand and with MaxA for balance is able to pivot step LLE to w/c. Provided vc/ tc for sequencing steps.  Gait Training:  Pt ambulated 20' x2 using RW with CGA. Demonstrated heavy UE support into RW and very slow  initiation with RLE but moves at consistent pace. Slightly flexed in posture but able to advance each LE with no physical assist. RLE with intermittent slide forward. Provided vc/ tc throughout for increased step height with RLE. Complains of neck pain with walking and MHP provided during seated rest.   Neuromuscular Re-ed: NMR facilitated during session with focus on standing balance. Pt guided in sit<>stand with focus on sequencing forward scoot, posterior feet placement, forward hip hinge and press of feet into floor with UE support into therapists arms.  During session, she is able toimprove LOA in rise to stand at [facility] from [facility] to [facility].  NMR performed for improvements in motor control and coordination, balance, sequencing, judgement, and self confidence/ efficacy in performing all aspects of mobility at highest level of independence.   Patient seated upright in w/c at end of session with brakes locked, belt alarm set, and all needs within reach. SLP taking over care. Small hot packs placed along upper traps.   Therapy Documentation Precautions:  Precautions Precautions: Fall Precaution/Restrictions Comments: R hemiparesis Restrictions Weight Bearing Restrictions Per Provider Order: No  Pain:  Pt relates moderate muscle pain in neck and addressed with thermotherapy to area with improvement noted by pt.    Therapy/Group: Individual Therapy  Mliss DELENA Milliner PT, DPT, CSRS 05/07/2024, 8:09 AM

## 2024-05-07 NOTE — Progress Notes (Signed)
 Speech Language Pathology Daily Session Note  Patient Details  Name: Hannah [name] MRN: [id number] Date of Birth: 1965/11/14  Today's Date: 05/07/2024 SLP Individual Time: 0904-1003 SLP Individual Time Calculation (min): 59 min  Short Term Goals: Week 1: SLP Short Term Goal 1 (Week 1): Patient will solve mildly complex structured problems with min assist. SLP Short Term Goal 2 (Week 1): Patient will utilize internal/external memory aids to recall daily information with 80% accuracy given mod assist. SLP Short Term Goal 3 (Week 1): Patient will utilize SLOP speech intelligibility strategies at the phrase level 60% of the time with min assist.  Skilled Therapeutic Interventions:  Patient was seen in am to address cognitive re- training and speech intelligibility. Direct handoff from PT with pt alert and seated upright in WC. Pt was agreeable for session. Pt recalled specific participation in PT session. She was able to recall 4/4 components of SLOP speech intelligibility strategies with supervision. SLP engaging pt and her son in a structured task with goal of pt utilizing speech intelligibility strategies at the phrase and sentence level. SLP also adding competing sound in form of TV remaining on and noise from air mattress. Pt achieved ~ 80-90% intelligibility and per son, pt sounds better than her baseline. Therapy schedule reviewed earlier in session, she recalled schedule at conclusion of this session mod I. Pt left upright in [facility] with call button within reach, son present and chair alarm active. SLP to continue POC.   Pain Pain Assessment Pain Scale: 0-10 Pain Score: 0-No pain  Therapy/Group: Individual Therapy  Joane GORMAN Fuss 05/07/2024, 9:04 AM

## 2024-05-07 NOTE — Plan of Care (Signed)
  Problem: Consults Goal: RH STROKE PATIENT EDUCATION Description: See Patient Education module for education specifics  Outcome: Progressing   Problem: RH SAFETY Goal: RH STG ADHERE TO SAFETY PRECAUTIONS W/ASSISTANCE/DEVICE Description: STG Adhere to Safety Precautions With cues Assistance/Device. Outcome: Progressing

## 2024-05-07 NOTE — Consult Note (Signed)
 Neuropsychological Consultation Comprehensive Inpatient Rehab   Patient:   Hannah [name]   DOB:   05-14-1966  MR Number:  [id number]  Location:  [facility] [address] CENTER [address] [place] [address] Dept: [phone] Loc: [phone]           Date of Service:   05/07/2024  Start Time:   10 AM End Time:   11 AM  Provider/Observer:  Norleen Asa, Psy.D.       Clinical Neuropsychologist       Billing Code/Service: [id number]  Reason for Service:    Hannah [name] is a 58 year old right-handed female referred for neuropsychological consultation due to functional decline including motor deficits and changes as well as cognitive changes with acute stroke as well as multiple previous strokes.  Patient is currently admitted to the comprehensive inpatient rehabilitation unit due to functional decline after most recent stroke.   Presenting Concerns:  Reports emotional distress and frustration regarding hospitalization, expressing a desire to avoid being in this situation again. Feels her stay is taking her out of her comfort zone.    Relevant Clinical History:  - Neurological: History of four prior cerebrovascular accidents (CVAs) with chronic right-sided weakness. Admitted 04/23/2024 with peripheral vision loss, left facial weakness, left-sided weakness, and aphasia. MRI Brain (04/2024) confirmed acute 4 mm infarct in the right dorsal midbrain and multiple scattered chronic microhemorrhages in the thalami and brainstem, likely related to hypertensive encephalopathy; also noted moderate chronic microvascular ischemic changes and remote lacunar infarcts. CTA head/neck was negative for intracranial disease or stenosis. A 2023 MRI showed age-advanced white matter disease consistent with extensive chronic small vessel ischemic disease and multiple chronic lacunar infarcts, with a possible component of chronic demyelinating disease.  -  Psychiatric: No current or past diagnoses mentioned.  - Medical: Coronary artery disease s/p percutaneous coronary intervention (PCI), hypertension (HTN), multiple sclerosis (MS), chorioretinitis, chronic low back pain (LBP), and Type 2 Diabetes Mellitus (T2DM) requiring hemodialysis (HD) on Monday, Wednesday, and Friday. 2D Echo showed an ejection fraction of 65-70% with a small pericardial effusion. Stroke etiology considered small vessel disease. Medication changes included changing Plavix  to Brillinta and adding ASA for 30 days, then to ASA 81 mg indefinitely. P2Y12 level was 229. Has experienced prolonged bleeding from AVG site post-HD. Anemia is being monitored.    Functional Capacity:  Requires moderate to maximal assistance with activities of daily living (ADLs), with cues for left lean. Requires moderate assistance for mobility and is able to take a couple of steps. At baseline, was independent at wheelchair level and could walk a few steps with a walker.    Mental State Examination:  - Mood and Affect: Appears distressed and tearful when discussing her recurrent strokes and hospitalization. Affect is congruent with reported mood.  - Insight and Judgement: Demonstrates some awareness of the challenges of her situation but expresses frustration with the current hospitalization, initially believing the sole purpose was to get stronger. Appears to gain some understanding of the dual goals of rehabilitation and medical management for secondary stroke prevention following psychoeducation.    Clinical Impressions:  Psychoeducation was provided regarding the rationale for the current inpatient stay, emphasizing the dual goals of physical strengthening (via PT/OT) and medical management to reduce the risk of future strokes. The role of the multidisciplinary team (including cardiology and neurology, coordinated by the attending physician) in monitoring blood pressure, blood viscosity, and adjusting  medications was explained. The importance of  this comprehensive approach to facilitate a safe and sustainable discharge home was highlighted. The client appeared to process this information, though continues to exhibit significant emotional distress related to her circumstances.    Recommendations and Next Steps:  - Continue with PT/OT/ST as planned.  - Continue multidisciplinary team collaboration for medical optimization and secondary stroke prevention.  - Follow-up neuropsychological check-in later this week to monitor mood and adjustment.  - Encourage patient to ask questions of the team.  - Discharge planning for home in the coming days, once medical and functional goals are met.         Electronically Signed   _______________________ Norleen Asa, Psy.D. Clinical Neuropsychologist

## 2024-05-07 NOTE — Progress Notes (Signed)
 " [facility] Progress Note   Subjective:   Seen in room - getting ready for PT session. HD went fine yesterday, off. No CP/dyspnea.  Objective Vitals:   05/06/24 1728 05/06/24 1804 05/06/24 1939 05/07/24 0442  BP: 137/67 (!) 132/57 (!) 140/59 120/64  Pulse: 88 88 96 87  Resp: 13 16 16 16   Temp: 97.7 F (36.5 C) 98.5 F (36.9 C) 98.1 F (36.7 C) 97.6 F (36.4 C)  TempSrc:  Oral Oral Oral  SpO2: 100% 99% 99% 100%  Weight:      Height:       Physical Exam General: Thin but well appearing, NAD. Room air Heart: RRR; no murmur Lungs: CTA anteriorly Abdomen: soft Extremities: no LE edema, reduced muscle mass Dialysis Access: R AVG +bruit  Additional Objective Labs: Basic Metabolic Panel: Recent Labs  Lab 05/04/24 0623 05/06/24 0630  NA 135 136  K 4.9 4.8  CL 97* 98  CO2 28 29  GLUCOSE 112* 73  BUN 34* 36*  CREATININE 5.93* 5.67*  CALCIUM  9.6 9.3  PHOS 4.2 4.0   Liver Function Tests: Recent Labs  Lab 05/04/24 0623 05/06/24 0630  ALBUMIN 3.7 3.6   CBC: Recent Labs  Lab 04/30/24 2249 04/30/24 2249 05/02/24 0447 05/03/24 0526 05/04/24 0623 05/06/24 0630  WBC 5.4  --  5.7 6.2 5.8 4.9  NEUTROABS  --    < > 4.2 4.7 4.3 3.6  HGB 9.7*  --  9.4* 8.8* 8.8* 8.5*  HCT 30.2*  --  29.3* 27.3* 27.0* 26.1*  MCV 96.2  --  96.4 96.5 97.1 96.7  PLT 191  --  190 150 150 137*   < > = values in this interval not displayed.   Medications:   amLODipine   10 mg Oral Daily   aspirin  EC  81 mg Oral Daily   azaTHIOprine   50 mg Oral Daily   B-complex with vitamin C  1 tablet Oral Daily   calcium  acetate  667 mg Oral TID WC   carvedilol   25 mg Oral BID WC   Chlorhexidine  Gluconate Cloth  6 each Topical Q0600   cholecalciferol   1,000 Units Oral Daily   [START ON 05/11/2024] darbepoetin (ARANESP ) injection - DIALYSIS  40 mcg Subcutaneous Q Fri-1800   famotidine   20 mg Oral QHS   hydrALAZINE   50 mg Oral TID   insulin  aspart  0-5 Units Subcutaneous QHS    insulin  aspart  0-9 Units Subcutaneous TID WC   insulin  glargine  10 Units Subcutaneous Daily   isosorbide  mononitrate  30 mg Oral QHS   l-methylfolate-B6-B12  1 tablet Oral Daily   lidocaine   1 patch Transdermal Q24H   polyethylene glycol  17 g Oral BID   rosuvastatin   20 mg Oral Daily   sodium phosphate  1 enema Rectal Once   ticagrelor   90 mg Oral BID    Dialysis Orders AF MWF  3:30hr, BFR 350, EDW 45.4kg, 2K/2Ca AVG 16g No heparin   -No ESA  -Hectorol  1 mcg q HD   Assessment/Plan: Acute CVA. H/o prior strokes. 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 Management per neuro. Now transferred to CIR ESRD: Usual MWF schedule - next HD tomorrow (12/23) per holiday schedule Prolonged bleeding from AVG: SQ heparin  held with resolution.  Continue to monitor. Consider a F'gram if bleeding recurs. Hypertension/volume: BP stable, no edema. Anemia of ESRD: Hgb down, will begin Aranesp  40mcg weekly on 12/26. Metabolic bone disease: Ca/Phos ok - continue home VDRA +  binders.  T2DM: On insulin , per primary. 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  ** Holiday Schedule for THIS week** Usual MWF schedule -> Sun (21), Tues (23), Friday (26) Usual TTS schedule -> Mon (22), Wed (24), Sat (27)  Katie Sharece Fleischhacker, PA-C 05/07/2024, 9:09 AM   [facility]    "

## 2024-05-07 NOTE — Discharge Summary (Signed)
 Physician Discharge Summary  Patient ID: Hannah [name] MRN: [id number] DOB/AGE: May 12, 1966 58 y.o.  Admit date: 04/30/2024 Discharge date: 05/13/2024  Discharge Diagnoses:  Principal Problem:   Small vessel stroke [facility]) Active Problems:   Cognitive and neurobehavioral dysfunction DVT prophylaxis Type 2 diabetes mellitus End-stage renal disease with hemodialysis Sacroiliitis Hypertension History of chorioretinitis Constipation GERD CAD/PCI Hyperlipidemia  Discharged Condition: Stable  Significant Diagnostic Studies: ECHOCARDIOGRAM COMPLETE Result Date: 04/24/2024    ECHOCARDIOGRAM REPORT   Patient Name:   Hannah [name] Date of Exam: 04/24/2024 Medical Rec #:  [id number]    Height:       62.0 in Accession #:    [id number]   Weight:       102.1 lb Date of Birth:  November 11, 1965   BSA:          1.436 m Patient Age:    58 years     BP:           190/74 mmHg Patient Gender: F            HR:           85 bpm. Exam Location:  Inpatient Procedure: 2D Echo (Both Spectral and Color Flow Doppler were utilized during            procedure). Indications:    Stroke  History:        Patient has prior history of Echocardiogram examinations.                 Stroke.  Sonographer:    Norleen Amour Referring Phys: [id number] ROBERT DORRELL IMPRESSIONS  1. Left ventricular ejection fraction, by estimation, is 65 to 70%. The left ventricle has normal function. The left ventricle has no regional wall motion abnormalities. There is mild concentric left ventricular hypertrophy. Left ventricular diastolic parameters were normal.  2. Right ventricular systolic function is normal. The right ventricular size is normal. There is normal pulmonary artery systolic pressure.  3. A small pericardial effusion is present. The pericardial effusion is circumferential. There is no evidence of cardiac tamponade.  4. The mitral valve is normal in structure. No evidence of mitral valve regurgitation. No evidence of mitral stenosis.  5. The  aortic valve is normal in structure. Aortic valve regurgitation is mild to moderate. No aortic stenosis is present.  6. Abdominal aorta is normal sized.  7. The inferior vena cava is normal in size with greater than 50% respiratory variability, suggesting right atrial pressure of 3 mmHg. FINDINGS  Left Ventricle: Left ventricular ejection fraction, by estimation, is 65 to 70%. The left ventricle has normal function. The left ventricle has no regional wall motion abnormalities. The left ventricular internal cavity size was normal in size. There is  mild concentric left ventricular hypertrophy. Left ventricular diastolic parameters were normal. Right Ventricle: The right ventricular size is normal. No increase in right ventricular wall thickness. Right ventricular systolic function is normal. There is normal pulmonary artery systolic pressure. The tricuspid regurgitant velocity is 2.27 m/s, and  with an assumed right atrial pressure of 3 mmHg, the estimated right ventricular systolic pressure is 23.6 mmHg. Left Atrium: Left atrial size was normal in size. Right Atrium: Right atrial size was normal in size. Pericardium: A small pericardial effusion is present. The pericardial effusion is circumferential. There is no evidence of cardiac tamponade. Mitral Valve: The mitral valve is normal in structure. No evidence of mitral valve regurgitation. No evidence of mitral valve stenosis. MV peak gradient, 4.8 mmHg. The mean  mitral valve gradient is 3.0 mmHg. Tricuspid Valve: The tricuspid valve is normal in structure. Tricuspid valve regurgitation is not demonstrated. No evidence of tricuspid stenosis. Aortic Valve: The aortic valve is normal in structure. Aortic valve regurgitation is mild to moderate. No aortic stenosis is present. Aortic valve mean gradient measures 3.0 mmHg. Aortic valve peak gradient measures 5.3 mmHg. Aortic valve area, by VTI measures 3.72 cm. Pulmonic Valve: The pulmonic valve was normal in structure.  Pulmonic valve regurgitation is not visualized. No evidence of pulmonic stenosis. Aorta: Abdominal aorta is normal sized. The aortic root is normal in size and structure. Venous: The inferior vena cava is normal in size with greater than 50% respiratory variability, suggesting right atrial pressure of 3 mmHg. IAS/Shunts: No atrial level shunt detected by color flow Doppler.  LEFT VENTRICLE PLAX 2D LVIDd:         3.70 cm     Diastology LVIDs:         1.90 cm     LV e' medial:    6.64 cm/s LV PW:         1.20 cm     LV E/e' medial:  13.4 LV IVS:        1.30 cm     LV e' lateral:   7.62 cm/s LVOT diam:     1.90 cm     LV E/e' lateral: 11.7 LV SV:         70 LV SV Index:   49 LVOT Area:     2.84 cm  LV Volumes (MOD) LV vol d, MOD A2C: 59.4 ml LV vol d, MOD A4C: 67.3 ml LV vol s, MOD A2C: 15.2 ml LV vol s, MOD A4C: 19.2 ml LV SV MOD A2C:     44.2 ml LV SV MOD A4C:     67.3 ml LV SV MOD BP:      47.7 ml RIGHT VENTRICLE             IVC RV Basal diam:  2.40 cm     IVC diam: 1.10 cm RV S prime:     15.70 cm/s TAPSE (M-mode): 2.2 cm      PULMONARY VEINS                             Diastolic Velocity: 51.40 cm/s                             S/D Velocity:       0.90                             Systolic Velocity:  48.20 cm/s LEFT ATRIUM             Index        RIGHT ATRIUM          Index LA diam:        3.40 cm 2.37 cm/m   RA Area:     6.82 cm LA Vol (A2C):   37.9 ml 26.39 ml/m  RA Volume:   10.50 ml 7.31 ml/m LA Vol (A4C):   43.1 ml 30.01 ml/m LA Biplane Vol: 44.2 ml 30.77 ml/m  AORTIC VALVE                    PULMONIC VALVE AV Area (Vmax):  3.01 cm     PV Vmax:       1.07 m/s AV Area (Vmean):   2.61 cm     PV Peak grad:  4.6 mmHg AV Area (VTI):     3.72 cm AV Vmax:           115.00 cm/s AV Vmean:          85.000 cm/s AV VTI:            0.189 m AV Peak Grad:      5.3 mmHg AV Mean Grad:      3.0 mmHg LVOT Vmax:         122.00 cm/s LVOT Vmean:        78.100 cm/s LVOT VTI:          0.248 m LVOT/AV VTI ratio: 1.31   AORTA Ao Root diam: 2.50 cm Ao Asc diam:  2.70 cm MITRAL VALVE                TRICUSPID VALVE MV Area (PHT): 5.58 cm     TR Peak grad:   20.6 mmHg MV Area VTI:   2.76 cm     TR Vmax:        227.00 cm/s MV Peak grad:  4.8 mmHg MV Mean grad:  3.0 mmHg     SHUNTS MV Vmax:       1.09 m/s     Systemic VTI:  0.25 m MV Vmean:      80.6 cm/s    Systemic Diam: 1.90 cm MV Decel Time: 136 msec MV E velocity: 89.10 cm/s MV A velocity: 121.00 cm/s MV E/A ratio:  0.74 Kardie Tobb DO Electronically signed by Dub Huntsman DO Signature Date/Time: 04/24/2024/2:38:49 PM    Final    CT ANGIO HEAD NECK W WO CM Result Date: 04/24/2024 CLINICAL DATA:  Stroke/TIA EXAM: CT ANGIOGRAPHY HEAD AND NECK WITH AND WITHOUT CONTRAST TECHNIQUE: Multidetector CT imaging of the head and neck was performed using the standard protocol during bolus administration of intravenous contrast. Multiplanar CT image reconstructions and MIPs were obtained to evaluate the vascular anatomy. Carotid stenosis measurements (when applicable) are obtained utilizing NASCET criteria, using the distal internal carotid diameter as the denominator. RADIATION DOSE REDUCTION: This exam was performed according to the departmental dose-optimization program which includes automated exposure control, adjustment of the mA and/or kV according to patient size and/or use of iterative reconstruction technique. CONTRAST:  75mL OMNIPAQUE  IOHEXOL  350 MG/ML SOLN COMPARISON:  Brain MRI April 23, 2024 FINDINGS: CT HEAD: There is low-attenuation in the cerebral white matter There is no hemorrhage. No acute ischemic changes. No mass lesion. The ventricles are normal. Skull/sinuses/orbits: No significant abnormality. CTA NECK: CTA NECK Aortic arch: No proximal vessel stenosis. Right carotid: There is a small amount of plaque with less than 50% stenosis Left carotid: Small amount of plaque with less than 50% stenosis Right vertebral: Normal Left vertebral: Normal Soft tissues: No significant  abnormality Other comments: None CTA HEAD: CTA HEAD Right anterior circulation: The internal carotid artery is patent without significant stenosis. The anterior and middle cerebral arteries are patent without significant stenosis or proximal branch occlusion. No aneurysm. Left anterior circulation: The internal carotid artery is patent without significant stenosis. The anterior and middle cerebral arteries are patent without significant stenosis or proximal branch occlusion. No aneurysm. Posterior circulation: Both vertebral arteries are patent intracranially. No basilar stenosis. There is mild stenosis of the P1 segment of the right posterior cerebral artery.  IMPRESSION: 1. Bilateral carotid plaque with less than 50% stenosis 2. No significant vertebrobasilar or intracranial disease 3. Chronic white matter abnormalities. No acute abnormality noted on the CT. Electronically Signed   By: Nancyann Burns M.D.   On: 04/24/2024 09:43   MR BRAIN WO CONTRAST Result Date: 04/23/2024 EXAM: MRI BRAIN WITHOUT CONTRAST 04/23/2024 05:42:47 PM TECHNIQUE: Multiplanar multisequence MRI of the head/brain was performed without the administration of intravenous contrast. COMPARISON: Same day CT head and MRI head 09/23/2023. CLINICAL HISTORY: Vision loss, known etiology. FINDINGS: BRAIN AND VENTRICLES: There is a 4 mm focus of acute infarct within the right dorsal aspect of the midbrain partially involving the periaqueductal gray matter seen on series 2 image 22. No significant associated edema or mass effect. Remote lacunar infarcts are present in the bilateral basal ganglia and thalami, left greater than right, and in the right corona radiata. Volume loss in the left cerebral peduncle is likely related to prior infarcts. T2 and FLAIR hyperintensity in the periventricular and subcortical white matter with additional signal abnormality in the pons is suggestive of moderate chronic microvascular ischemic changes. Multiple scattered  chronic microhemorrhages are present, particularly within the thalami and brainstem, likely related to hypertensive encephalopathy. Few additional smaller chronic microhemorrhages are seen in the cerebral hemispheres. There is mild generalized parenchymal volume loss. No mass. No midline shift. No hydrocephalus. The sella is unremarkable. Normal flow voids. ORBITS: Bilateral lens replacement. SINUSES AND MASTOIDS: Mucosal thickening in the left sphenoid sinus. BONES AND SOFT TISSUES: Normal marrow signal. Degenerative changes in the visualized upper cervical spine. Prominent signal abnormality at C4-C5 likely reflecting chronic degenerative endplate changes. No acute soft tissue abnormality. IMPRESSION: 1. Acute infarct in the right dorsal midbrain measuring 4 mm, partially involving the periaqueductal gray matter. No significant edema or mass effect. 2. Multiple scattered chronic microhemorrhages, particularly within the thalami and brainstem, likely related to hypertensive encephalopathy. 3. Moderate chronic microvascular ischemic changes, mild generalized parenchymal volume loss, remote lacunar infarcts as above. Electronically signed by: Donnice Mania MD 04/23/2024 06:01 PM EST RP Workstation: [id number]   CT HEAD WO CONTRAST Result Date: 04/23/2024 EXAM: CT HEAD WITHOUT CONTRAST 04/23/2024 02:19:26 PM TECHNIQUE: CT of the head was performed without the administration of intravenous contrast. Automated exposure control, iterative reconstruction, and/or weight based adjustment of the mA/kV was utilized to reduce the radiation dose to as low as reasonably achievable. COMPARISON: 09/23/2023 CLINICAL HISTORY: Neuro deficit, acute, stroke suspected. FINDINGS: BRAIN AND VENTRICLES: No acute hemorrhage. No evidence of acute infarct. Patchy and confluent decreased attenuation throughout deep and periventricular white matter of cerebral hemispheres bilaterally, compatible with chronic microvascular ischemic disease.  Chronic right corona radiata, left basal ganglia lacunar infarcts. No hydrocephalus. No extra-axial collection. No mass effect or midline shift. ORBITS: Bilateral lens replacement. SINUSES: Left sphenoid sinus mucosal thickening. SOFT TISSUES AND SKULL: No acute soft tissue abnormality. No skull fracture. VASCULATURE: Atherosclerotic calcifications within cavernous internal carotid arteries. IMPRESSION: 1. No acute intracranial abnormality. 2. Moderate chronic microvascular ischemic disease. Chronic right corona radiata and left basal ganglia lacunar infarcts. Electronically signed by: Donnice Mania MD 04/23/2024 02:41 PM EST RP Workstation: [id number]    Labs:  Basic Metabolic Panel: Recent Labs  Lab 05/04/24 0623 05/06/24 0630 05/08/24 1353  NA 135 136 134*  K 4.9 4.8 4.2  CL 97* 98 97*  CO2 28 29 27   GLUCOSE 112* 73 156*  BUN 34* 36* 35*  CREATININE 5.93* 5.67* 5.16*  CALCIUM  9.6 9.3 9.0  PHOS 4.2 4.0  3.6    CBC: Recent Labs  Lab 05/04/24 0623 05/06/24 0630 05/08/24 1353  WBC 5.8 4.9 4.0  NEUTROABS 4.3 3.6  --   HGB 8.8* 8.5* 8.1*  HCT 27.0* 26.1* 25.1*  MCV 97.1 96.7 96.5  PLT 150 137* 93*    CBG: Recent Labs  Lab 05/09/24 2117 05/10/24 0523 05/10/24 1136 05/10/24 1638 05/10/24 2119  GLUCAP 222* 125* 125* 235* 312*    Brief HPI:   Hannah [name] is a 58 y.o. right handed female with history significant for CVA with chronic right sided weakness, CAD status post PCI, hypertension, MS,Chorioretinitis, chronic low back pain, type 2 diabetes mellitus, end-stage renal disease with hemodialysis.  Presented 04/23/2024 with acute onset of vision loss, left facial weakness and left-sided weakness and aphasia.  MRI of the brain revealing acute infarct right dorsal midbrain and multiple scattered chronic microhemorrhages particularly within the thalami and brainstem.  CTA head/neck negative for intracranial disease or stenosis.  Echocardiogram with ejection fraction of 65 to 70% no  wall motion abnormality small pericardial effusion.  Neurology follow-up felt stroke due to small vessel disease and Plavix  changed to Brilinta  and aspirin  added with recommendations to continue for 30 days then changed to aspirin  81 mg indefinitely.  Hemodialysis ongoing Monday Wednesday Friday she did have some issues with prolonged bleeding from AVG site.  Anemia being monitored.  Therapy evaluations completed due to patient's decreased functional mobility was admitted for a comprehensive rehab program.   Hospital Course: Hannah [name] was admitted to rehab 04/30/2024 for inpatient therapies to consist of PT, ST and OT at least three hours five days a week. Past admission physiatrist, therapy team and rehab RN have worked together to provide customized collaborative inpatient rehab.  Pertaining to patient's right dorsal midbrain infarction remained stable.  Maintained on aspirin  81 mg daily and Brilinta  90 mg twice daily x 30 days then aspirin  81 mg indefinitely.  Subcutaneous heparin  for DVT prophylaxis.  History of sacroiliitis continued on low-dose Norco followed by pain clinic Dr. Rockey Pae at [facility] [place].  Hemodialysis ongoing as per renal services.  Monitoring of blood sugars hemoglobin A1c 8.5 insulin  therapy as directed and her Tresiba  was resumed at discharge.  Blood pressure controlled monitored on current regimen needing close monitoring with increased mobility.  History of Chorioretinitis continued on azithropine daily.  Bouts of constipation resolved with laxative assistance.  GERD with Pepcid  as directed.   Blood pressures were monitored on TID basis and remained soft and monitored  Diabetes has been monitored with ac/hs CBG checks and SSI was use prn for tighter BS control.    Rehab course: During patient's stay in rehab weekly team conferences were held to monitor patient's progress, set goals and discuss barriers to discharge. At admission, patient required moderate assist  stand pivot transfers moderate assist to feet rolling walker  He/She  has had improvement in activity tolerance, balance, postural control as well as ability to compensate for deficits. He/She has had improvement in functional use RUE/LUE  and RLE/LLE as well as improvement in awareness.  Bed mobility required standby assist for rolling right to left and in order to transition from supine to sitting edge of bed.  Completed squat pivot transfers from bed to wheelchair with moderate assist while utilizing no device.  Completes upper body bathing with set up lower body bathing with minimal assist.  Upper body dressing completed while seated in wheelchair required minimal assist and lower body dressing completed while supine in bed  requiring moderate assist.  During therapies working with energy conservation techniques.  Sessions focused on functional mobility transfers and generalized strengthening.  Patient transferred semireclined sitting edge of bed head of bed elevated and use of bed rails with supervision.  Patient stood with rolling walker and minimal assist in preparation to walk ambulates short distances rolling walker moderate assist.  Working with ongoing trials of standing x 3 from wheelchair rolling walker with minimal assist.  SLP follow-up to address motor speech goals prompted various questions for informal conversation and prompted structured speech tasks to encourage verbal output.  Running speech observed to be 85 to 90% intelligible to the listener.  Full family teaching completed plan discharge to home       Disposition: 06 Home with home health    Diet: Renal diet  Special Instructions: No driving smoking or alcohol  Medications at discharge. 1.  Tylenol  as needed 2.  Norvasc  10 mg p.o. daily 3.  Aspirin  81 mg p.o. daily 4.  Imuran  50 mg p.o. daily 5.  B complex with vitamin C 1 tablet daily 6.  PhosLo  667 mg p.o. 3 times daily 7.  Coreg  25 mg p.o. twice daily 8.  Vitamin D   1000 units p.o. daily 9.  Aranesp  weekly with hemodialysis 10.  Pepcid  20 mg p.o. nightly 11.  Hydralazine  50 mg p.o. 3 times daily 12.  Zofran  4 mg every 8 hours as needed nausea or vomiting 13.  Tresiba  12 units nightly 14.  Imdur  30 mg p.o. nightly 15.  Lidocaine  patch changes directed 16.  Melatonin 5 mg p.o. nightly as needed sleep 17.  MiraLAX  daily hold for loose stools 18.  Crestor  20 mg p.o. daily 19.  Brilinta  90 mg p.o. twice daily until 05/24/2024 and stop 20.  Voltaren  gel 2 g 4 times daily to affected area 21.  Nitroglycerin  as needed chest pain 22.  Colace 100 mg daily  30-35 minutes were spent completing discharge summary and discharge planning  Discharge Instructions     Ambulatory referral to Neurology   Complete by: As directed    An appointment is requested in approximately: 4 weeks right midbrain dorsal infarction   Ambulatory referral to Physical Medicine Rehab   Complete by: As directed    Moderate complexity follow-up 1 month right midbrain dorsal infarction        Follow-up Information     Geralynn Charleston, MD Follow up.   Specialty: Nephrology Why: Call for appointment Contact information: [address] [place] [address] [phone]         Delayne Artist PARAS, MD Follow up.   Specialty: Family Medicine Why: Call for appointment Contact information: [address] [place] [address] [phone]         Waylon Rockey SAUNDERS, MD Follow up.   Specialty: Pain Medicine Why: Call for appointment Contact information: [address] [place] [address] [phone]         Lorilee Sven SQUIBB, MD Follow up.   Specialty: Physical Medicine and Rehabilitation Why: Office to call for appointment Contact information: [address] [place] [address] [phone]                 Signed: Toribio PARAS Pitch 05/11/2024, 4:34 AM

## 2024-05-07 NOTE — Progress Notes (Signed)
 Occupational Therapy Session Note  Patient Details  Name: Hannah [name] MRN: [id number] Date of Birth: 06-12-1965  Today's Date: 05/07/2024 OT Individual Time: 1105-1200 OT Individual Time Calculation (min): 55 min    Short Term Goals: Week 1:  OT Short Term Goal 1 (Week 1): Pt will perform toilet transfers MIN A OT Short Term Goal 2 (Week 1): Pt will perform LB dressing MOD A OT Short Term Goal 3 (Week 1): Pt will improve sitting at EOB for 10 minutes to perform ADL task  Skilled Therapeutic Interventions/Progress Updates:  Skilled OT intervention completed with focus on family/caregiver education. Pt received seated in w/c, c/o need for BM but requested to do therapy then set up for enema near end of session. Nurse aware. Offered rest as needed.  Pt's son present, indicating he has upcoming biceps tendon repair and at baseline picks his mom up in cradle position to transfer at home. We discussed alternatives to transfer pt with his 1 arm (ideally 2), which safest would be a squat pivot vs ambulating pt especially when she is fatigued. Transported > gym. OT demonstrated with pt min A squat pivot w/c <> EOM x2. Cues needed for hand positioning and foot placement as well as anterior weight shifting. Guided pt's son in body mechanics for replicating this level of assist. Son was able to squat pivot pt with initial mod/max A (due to pt's lack of effort in helping due to learned dependence) but was then able to progress to min A with increased understanding of pt's abilities and opportunity given to pt to complete as much on her own first. Completed x5.  Pt with fatigue, therefore transport back to room, and son assisted pt back to bed with prior assist, then cues needed for pt to transition side lying > supine and was able to doff her own LB clothing with min A over feet in prep for enema. Discussed with pt about potential need for [name] at bedside or in bedroom since w/c doesn't fit through bathroom  doorway however pt was not open to this recommendation. Direct care handoff to nursing.   Therapy Documentation Precautions:  Precautions Precautions: Fall Precaution/Restrictions Comments: R hemiparesis Restrictions Weight Bearing Restrictions Per Provider Order: No    Therapy/Group: Individual Therapy  Lorrayne FORBES Fritter, MS, OTR/L  05/07/2024, 12:16 PM

## 2024-05-07 NOTE — Progress Notes (Signed)
 "                                                        PROGRESS NOTE   Subjective/Complaints: No events overnight. Feeling nauseas and constipated today. Is passing flatus. No other complants Vitals stable, no labs this a.m.  Labs yesterday with stable BUN/creatinine, stable hemoglobin.    05/07/2024    4:42 AM 05/06/2024    7:39 PM 05/06/2024    6:04 PM  Vitals with BMI  Systolic 120 140 867  Diastolic 64 59 57  Pulse 87 96 88    Recent Labs    05/06/24 1840 05/06/24 2043 05/07/24 0539  GLUCAP 171* 240* 112*  Sugars fairly well-controlled with intermittent highs over 200.  P.o. intakes appropriate   Last BM 12-18, type I, medium. No response to sorbitol  yesterday   ROS: Positives per HPI above. Denies fevers, chills, SOB, chest pain, new weakness or paraesthesias.   nausea ,  constipation--ongoing, +insomnia, denies pain   Objective:   No results found. Recent Labs    05/06/24 0630  WBC 4.9  HGB 8.5*  HCT 26.1*  PLT 137*   Recent Labs    05/06/24 0630  NA 136  K 4.8  CL 98  CO2 29  GLUCOSE 73  BUN 36*  CREATININE 5.67*  CALCIUM  9.3     Intake/Output Summary (Last 24 hours) at 05/07/2024 0802 Last data filed at 05/06/2024 1728 Gross per 24 hour  Intake 360 ml  Output 500 ml  Net -140 ml     Wound 04/30/24 1723 Pressure Injury Sacrum Left Stage 2 -  Partial thickness loss of dermis presenting as a shallow open injury with a red, pink wound bed without slough. (Active)    Physical Exam: Vital Signs Blood pressure 120/64, pulse 87, temperature 97.6 F (36.4 C), temperature source Oral, resp. rate 16, height 5' 2.4 (1.585 m), weight 48.5 kg, SpO2 100%.  Gen: no distress, normal appearing. Sitting up in chair HEENT: oral mucosa pink and moist, NCAT Cardio: Reg rate and rhythm. No m/r/g.  Chest: normal effort, normal rate of breathing. CTAB.  Abd: soft, non-distended, +BS hypoactive; TTP bilaterally diffusely Ext: no edema.  Psych:  pleasant, normal affect Skin: intact in visible surfaces   Neuro: +generalized muscle wasting, +ataxic speech- improving, 4-/5 strength on the right side, otherwise strength is 5/5   Physical exam unchanged from the above on reexamination 05/07/2024    Assessment/Plan: 1. Functional deficits which require 3+ hours per day of interdisciplinary therapy in a comprehensive inpatient rehab setting. Physiatrist is providing close team supervision and 24 hour management of active medical problems listed below. Physiatrist and rehab team continue to assess barriers to discharge/monitor patient progress toward functional and medical goals  Care Tool:  Bathing    Body parts bathed by patient: Right arm, Chest, Abdomen, Right upper leg, Left upper leg, Face   Body parts bathed by helper: Left arm, Buttocks, Front perineal area, Right lower leg, Left lower leg     Bathing assist Assist Level: Maximal Assistance - Patient 24 - 49%     Upper Body Dressing/Undressing Upper body dressing   What is the patient wearing?: Hospital gown only    Upper body assist Assist Level: Maximal Assistance - Patient 25 - 49%    Lower  Body Dressing/Undressing Lower body dressing      What is the patient wearing?: Underwear/pull up     Lower body assist Assist for lower body dressing: Total Assistance - Patient < 25%     Toileting Toileting    Toileting assist Assist for toileting: Maximal Assistance - Patient 25 - 49%     Transfers Chair/bed transfer  Transfers assist     Chair/bed transfer assist level: Moderate Assistance - Patient 50 - 74%     Locomotion Ambulation   Ambulation assist      Assist level: Moderate Assistance - Patient 50 - 74% Assistive device: Walker-rolling Max distance: 17ft   Walk 10 feet activity   Assist  Walk 10 feet activity did not occur: Safety/medical concerns (fatigue, weakness/deconditioning)  Assist level: Moderate Assistance - Patient - 50 -  74% Assistive device: Walker-rolling   Walk 50 feet activity   Assist Walk 50 feet with 2 turns activity did not occur: Safety/medical concerns (fatigue, weakness/deconditioning)  Assist level: Moderate Assistance - Patient - 50 - 74% Assistive device: Walker-Eva    Walk 150 feet activity   Assist Walk 150 feet activity did not occur: Safety/medical concerns         Walk 10 feet on uneven surface  activity   Assist Walk 10 feet on uneven surfaces activity did not occur: Safety/medical concerns (fatigue, weakness/deconditioning)         Wheelchair     Assist Is the patient using a wheelchair?: Yes Type of Wheelchair: Manual    Wheelchair assist level: Supervision/Verbal cueing Max wheelchair distance: 72'    Wheelchair 50 feet with 2 turns activity    Assist        Assist Level: Supervision/Verbal cueing   Wheelchair 150 feet activity     Assist      Assist Level: Maximal Assistance - Patient 25 - 49%   Blood pressure 120/64, pulse 87, temperature 97.6 F (36.4 C), temperature source Oral, resp. rate 16, height 5' 2.4 (1.585 m), weight 48.5 kg, SpO2 100%.  Medical Problem List and Plan: 1. Functional deficits secondary to RIght dorsal midbrain infarct             -patient may  shower             -ELOS/Goals: 10-14d sup/minA--estimated discharge 12-28  Chart and therapy notes reviewed, continue CIR, discussed patient's progress with therapy Grounds pass ordered Vitamin D3/Metanx/Vitamin B/C complex ordered F/u with me or Fidela in clinic within 1 month Would benefit from home health aide upon discharge  This patient is capable of making decisions on her own behalf. Updated daughter  2.  Anemia: Heparin  d/ced due to bleeding from fistula site, Hgb reviewed and is now stable, stool occult reviewed and is negative             -antiplatelet therapy: ASA/Ticagrelor   3. H/o Sacroiliitis: Tylenol  prn mild pain. Decrease Norco to 1 tab daily  prn, lidocaine  patch ordered  4. Insomnia: scheduled melatonin 5mg  HS   5. T2DM:  Hgb A1c-  8.5. Monitor BS ac/hs and use SSI for elevated BS.              --was on insulin  glargine 12 units w/ 5 units novolog  TID. Increase glargine to 11U but patient refuses this dose and wants 10U due to past experiences with hypoglycemia- changed back             --will continue to monitor BS for trend and titrate  insulin  as needed.   - 12/22: Intermittent highs to the 190s/200s, but overall fairly well-controlled.  Appears no longer on NovoLog --consider increase in glargine to 12 units  (will defer as patient refused prior) Recent Labs    05/06/24 1840 05/06/24 2043 05/07/24 0539  GLUCAP 171* 240* 112*      6. ESRD: HD MWF at the end of the day to help with tolerance of therapy.              --Phoslo  tid ac for metabolic bone disease.   Continue fluid restriction--labs stable  7. Hypertension: increase amlodipine  to 10mg  daily, continue coreg , hydralazine  and imdur .  12/22: Vital stable 8. H/o Chorioretinitis: continue azithropine daily. .   9. Constipation: LBM 12/18- sorbitol  ordered 12/21  12/22: No results with sorbitol , will  give an enema today per patient preference--+ results, no size recorded  10. GERD: continue pepcid   LOS: 7 days A FACE TO FACE EVALUATION WAS PERFORMED  Joesph JAYSON Likes 05/07/2024, 8:02 AM     "

## 2024-05-07 NOTE — Discharge Instructions (Addendum)
 Inpatient Rehab Discharge Instructions  Hannah [name] Discharge date and time: No discharge date for patient encounter.   Activities/Precautions/ Functional Status: Activity: activity as tolerated Diet: renal diet Wound Care: Routine skin checks Functional status:  ___ No restrictions     ___ Walk up steps independently ___ 24/7 supervision/assistance   ___ Walk up steps with assistance ___ Intermittent supervision/assistance  ___ Bathe/dress independently ___ Walk with walker     _x__ Bathe/dress with assistance ___ Walk Independently    ___ Shower independently ___ Walk with assistance    ___ Shower with assistance ___ No alcohol     ___ Return to work/school ________  Special Instructions: No driving smoking or alcohol  COMMUNITY REFERRALS UPON DISCHARGE:    Home Health:   PT     OT                     Agency: [facility] Phone: [phone]  *Please expect follow-up within 2-3 business days for discharge to schedule your home visit. If you have not received follow-up, be sure to contact the site directly.*   Medical Equipment/Items Ordered: Hospital Bed                                                 Agency/Supplier: [facility]   Continue hemodialysis as directedSTROKE/TIA DISCHARGE INSTRUCTIONS SMOKING Cigarette smoking nearly doubles your risk of having a stroke & is the single most alterable risk factor  If you smoke or have smoked in the last 12 months, you are advised to quit smoking for your health. Most of the excess cardiovascular risk related to smoking disappears within a year of stopping. Ask you doctor about anti-smoking medications  Quit Line: 1-800-QUIT NOW Free Smoking Cessation Classes [phone]  CHOLESTEROL Know your levels; limit fat & cholesterol in your diet  Lipid Panel     Component Value Date/Time   CHOL 111 04/24/2024 0456   CHOL 133 04/01/2022 1007   TRIG 44 04/24/2024 0456   HDL 72 04/24/2024 0456   HDL 80 04/01/2022 1007   CHOLHDL 1.5  04/24/2024 0456   VLDL 9 04/24/2024 0456   LDLCALC 30 04/24/2024 0456   LDLCALC 41 04/01/2022 1007     Many patients benefit from treatment even if their cholesterol is at goal. Goal: Total Cholesterol (CHOL) less than 160 Goal:  Triglycerides (TRIG) less than 150 Goal:  HDL greater than 40 Goal:  LDL (LDLCALC) less than 100   BLOOD PRESSURE American Stroke Association blood pressure target is less that 120/80 mm/Hg  Your discharge blood pressure is:  BP: 120/64 Monitor your blood pressure Limit your salt and alcohol intake Many individuals will require more than one medication for high blood pressure  DIABETES (A1c is a blood sugar average for last 3 months) Goal HGBA1c is under 7% (HBGA1c is blood sugar average for last 3 months)  Diabetes:    Lab Results  Component Value Date   HGBA1C 8.5 (H) 04/24/2024    Your HGBA1c can be lowered with medications, healthy diet, and exercise. Check your blood sugar as directed by your physician Call your physician if you experience unexplained or low blood sugars.  PHYSICAL ACTIVITY/REHABILITATION Goal is 30 minutes at least 4 days per week  Activity: Increase activity slowly, Therapies: Physical Therapy: Home Health Return to work:  Activity decreases your  risk of heart attack and stroke and makes your heart stronger.  It helps control your weight and blood pressure; helps you relax and can improve your mood. Participate in a regular exercise program. Talk with your doctor about the best form of exercise for you (dancing, walking, swimming, cycling).  DIET/WEIGHT Goal is to maintain a healthy weight  Your discharge diet is:  Diet Order             DIET SOFT Room service appropriate? Yes; Fluid consistency: Thin; Fluid restriction: 1200 mL Fluid  Diet effective now                   liquids Your height is:  Height: 5' 2.4 (158.5 cm) Your current weight is: Weight: 48.5 kg Your Body Mass Index (BMI) is:  BMI (Calculated): 19.32  Following the type of diet specifically designed for you will help prevent another stroke. Your goal weight range is:   Your goal Body Mass Index (BMI) is 19-24. Healthy food habits can help reduce 3 risk factors for stroke:  High cholesterol, hypertension, and excess weight.  RESOURCES Stroke/Support Group:  Call [phone]   STROKE EDUCATION PROVIDED/REVIEWED AND GIVEN TO PATIENT Stroke warning signs and symptoms How to activate emergency medical system (call 911). Medications prescribed at discharge. Need for follow-up after discharge. Personal risk factors for stroke. Pneumonia vaccine given: No Flu vaccine given: No My questions have been answered, the writing is legible, and I understand these instructions.  I will adhere to these goals & educational materials that have been provided to me after my discharge from the hospital.      My questions have been answered and I understand these instructions. I will adhere to these goals and the provided educational materials after my discharge from the hospital.  Patient/Caregiver Signature _______________________________ Date __________  Clinician Signature _______________________________________ Date __________  Please bring this form and your medication list with you to all your follow-up doctor's appointments.

## 2024-05-08 LAB — CBC
HCT: 25.1 % — ABNORMAL LOW (ref 36.0–46.0)
Hemoglobin: 8.1 g/dL — ABNORMAL LOW (ref 12.0–15.0)
MCH: 31.2 pg (ref 26.0–34.0)
MCHC: 32.3 g/dL (ref 30.0–36.0)
MCV: 96.5 fL (ref 80.0–100.0)
Platelets: 93 K/uL — ABNORMAL LOW (ref 150–400)
RBC: 2.6 MIL/uL — ABNORMAL LOW (ref 3.87–5.11)
RDW: 16 % — ABNORMAL HIGH (ref 11.5–15.5)
WBC: 4 K/uL (ref 4.0–10.5)
nRBC: 0 % (ref 0.0–0.2)

## 2024-05-08 LAB — RENAL FUNCTION PANEL
Albumin: 3.6 g/dL (ref 3.5–5.0)
Anion gap: 10 (ref 5–15)
BUN: 35 mg/dL — ABNORMAL HIGH (ref 6–20)
CO2: 27 mmol/L (ref 22–32)
Calcium: 9 mg/dL (ref 8.9–10.3)
Chloride: 97 mmol/L — ABNORMAL LOW (ref 98–111)
Creatinine, Ser: 5.16 mg/dL — ABNORMAL HIGH (ref 0.44–1.00)
GFR, Estimated: 9 mL/min — ABNORMAL LOW
Glucose, Bld: 156 mg/dL — ABNORMAL HIGH (ref 70–99)
Phosphorus: 3.6 mg/dL (ref 2.5–4.6)
Potassium: 4.2 mmol/L (ref 3.5–5.1)
Sodium: 134 mmol/L — ABNORMAL LOW (ref 135–145)

## 2024-05-08 LAB — GLUCOSE, CAPILLARY
Glucose-Capillary: 69 mg/dL — ABNORMAL LOW (ref 70–99)
Glucose-Capillary: 121 mg/dL — ABNORMAL HIGH (ref 70–99)
Glucose-Capillary: 160 mg/dL — ABNORMAL HIGH (ref 70–99)
Glucose-Capillary: 131 mg/dL — ABNORMAL HIGH (ref 70–99)
Glucose-Capillary: 137 mg/dL — ABNORMAL HIGH (ref 70–99)

## 2024-05-08 MED ORDER — GLUCOSE 40 % PO GEL: 1.0000 | ORAL | Status: DC

## 2024-05-08 NOTE — Progress Notes (Signed)
 Unable to see patient as she was not in room during grounds. Discussed with daughter that she continues to progress well and we will update her after team conference tomorrow. Discontinued HS ISS due to CBG 69 this AM

## 2024-05-08 NOTE — Progress Notes (Signed)
 Occupational Therapy Session Note  Patient Details  Name: Hannah [name] MRN: [id number] Date of Birth: Dec 27, 1965  Today's Date: 05/08/2024 OT Individual Time: 1050-1200 OT Individual Time Calculation (min): 70 min    Short Term Goals: Week 1:  OT Short Term Goal 1 (Week 1): Pt will perform toilet transfers MIN A OT Short Term Goal 2 (Week 1): Pt will perform LB dressing MOD A OT Short Term Goal 3 (Week 1): Pt will improve sitting at EOB for 10 minutes to perform ADL task  Skilled Therapeutic Interventions/Progress Updates:  Skilled OT intervention completed with focus on UE NMR, functional transfers. Pt received seated in w/c, agreeable to session. No pain reported.  Pt already dressed and ready for day. Transported dependently in w/c > gym. Pt with delayed processing and motor planning, but able to stand with min A however retropulsion requiring cues for anterior weight shifting to RW. Min/mod A stand pivot with RW > EOM. Able to sit with supervision statically. Utilized RUE and LUE for NMR during christmas painting activity on elevated surface. RUE did fatigue with prolonged shoulder flexion requiring alternating breaks to other UE. R elbow with limited extension with suspected contracture from prior CVAs however able to participate in task with only minimal difficulty. Transitioned to flat card painting activity with pt needing modifications for lower vision to free paint using LUE (dominant for writing at baseline).  Min A sit > stand and stand pivot using RW > w/c. Back in room, mod A sit > stand and mod A ambulatory transfer to EOB. Mod A to doff shoes. Supervision sit > supine transition. Pt remained semi upright in bed, with bed alarm on/activated, and with all needs in reach at end of session.   Therapy Documentation Precautions:  Precautions Precautions: Fall Precaution/Restrictions Comments: R hemiparesis Restrictions Weight Bearing Restrictions Per Provider Order:  No    Therapy/Group: Individual Therapy  Lorrayne FORBES Fritter, MS, OTR/L  05/08/2024, 12:06 PM

## 2024-05-08 NOTE — Progress Notes (Signed)
 " [facility] Progress Note   Subjective:   Seen while working with PT. No overnight issues. No CP/dyspnea. For HD later today.  Objective Vitals:   05/07/24 0442 05/07/24 1554 05/07/24 1942 05/08/24 0442  BP: 120/64 (!) 128/58 (!) 147/67 (!) 140/68  Pulse: 87 84 89 88  Resp: 16 20 17 18   Temp: 97.6 F (36.4 C) 99 F (37.2 C) 98.2 F (36.8 C) 98.4 F (36.9 C)  TempSrc: Oral Oral Oral Oral  SpO2: 100% 100% 100% 99%  Weight:      Height:       Physical Exam General: Thin but well appearing, NAD. Room air Heart: RRR; no murmur Lungs: CTA anteriorly Abdomen: soft Extremities: no LE edema, reduced muscle mass Dialysis Access: R AVG +bruit  Additional Objective Labs: Basic Metabolic Panel: Recent Labs  Lab 05/04/24 0623 05/06/24 0630  NA 135 136  K 4.9 4.8  CL 97* 98  CO2 28 29  GLUCOSE 112* 73  BUN 34* 36*  CREATININE 5.93* 5.67*  CALCIUM  9.6 9.3  PHOS 4.2 4.0   Liver Function Tests: Recent Labs  Lab 05/04/24 0623 05/06/24 0630  ALBUMIN 3.7 3.6   CBC: Recent Labs  Lab 05/02/24 0447 05/03/24 0526 05/04/24 0623 05/06/24 0630  WBC 5.7 6.2 5.8 4.9  NEUTROABS 4.2 4.7 4.3 3.6  HGB 9.4* 8.8* 8.8* 8.5*  HCT 29.3* 27.3* 27.0* 26.1*  MCV 96.4 96.5 97.1 96.7  PLT 190 150 150 137*   CBG: Recent Labs  Lab 05/07/24 1222 05/07/24 1633 05/07/24 2109 05/08/24 0624 05/08/24 0647  GLUCAP 122* 246* 229* 69* 121*   Medications:   amLODipine   10 mg Oral Daily   aspirin  EC  81 mg Oral Daily   azaTHIOprine   50 mg Oral Daily   B-complex with vitamin C  1 tablet Oral Daily   calcium  acetate  667 mg Oral TID WC   carvedilol   25 mg Oral BID WC   Chlorhexidine  Gluconate Cloth  6 each Topical Q0600   cholecalciferol   1,000 Units Oral Daily   [START ON 05/11/2024] darbepoetin (ARANESP ) injection - DIALYSIS  40 mcg Subcutaneous Q Fri-1800   famotidine   20 mg Oral QHS   hydrALAZINE   50 mg Oral TID   insulin  aspart  0-5 Units Subcutaneous QHS    insulin  aspart  0-9 Units Subcutaneous TID WC   insulin  glargine  10 Units Subcutaneous Daily   isosorbide  mononitrate  30 mg Oral QHS   l-methylfolate-B6-B12  1 tablet Oral Daily   lidocaine   1 patch Transdermal Q24H   polyethylene glycol  17 g Oral BID   rosuvastatin   20 mg Oral Daily   ticagrelor   90 mg Oral BID    Dialysis Orders AF MWF  3:30hr, BFR 350, EDW 45.4kg, 2K/2Ca AVG 16g No heparin   -No ESA  -Hectorol  1 mcg q HD   Assessment/Plan: Acute CVA. H/o prior strokes. 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 Management per neuro. In CIR ESRD: Usual MWF schedule - next HD today (12/23) per holiday schedule Prolonged bleeding from AVG: SQ heparin  held with resolution.  Continue to monitor. Consider a F'gram if bleeding recurs. Hypertension/volume: BP up slightly today, no edema. Anemia of ESRD: Hgb down, will begin Aranesp  40mcg weekly on 12/26. Metabolic bone disease: Ca/Phos ok - continue home VDRA + binders.  T2DM: On insulin , per primary. 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   ** Holiday Schedule for THIS week** Usual MWF schedule -> Sun (21), Tues (23), Friday (26)  Usual TTS schedule -> Mon (22), Wed (24), Sat (27)   Izetta Boehringer, PA-C 05/08/2024, 9:13 AM  [facility]    "

## 2024-05-08 NOTE — Progress Notes (Signed)
 Physical Therapy Session Note  Patient Details  Name: Hannah [name] MRN: [id number] Date of Birth: 1965-07-16  Today's Date: 05/08/2024 PT Individual Time: 0800-0900 PT Individual Time Calculation (min): 60 min   Short Term Goals: Week 1:  PT Short Term Goal 1 (Week 1): pt will transfer supine<>sitting EOB with min A consistantly PT Short Term Goal 2 (Week 1): pt will transfer sit<>stand with LRAD and min A consistantly PT Short Term Goal 3 (Week 1): pt will perform bed<>chair transfer with LRAD and min A consistantly  Skilled Therapeutic Interventions/Progress Updates:     Pt semi-reclined in bed, daughter doing her hair upon arrival. Pt denies pain and agreeable to therapy. Nursing administered morning meds during session. Session emphasized functional strengthening, endurance/activity tolerance, and dynamic balance with transfers and ambulation. Pt donned new brief with mod/max A in bridge position with cues for increased R hip extension. Pt performed supine to sitting EOB transfer using bed rail with mod A. Donned socks, pants, and shoes sitting EOB with max A and stood with RW and mod A and required max A to pull over hips. Performed stand<>pivot into WC with RW and min A and max VC to sequencing and improved hip/knee extension in standing. Pt transported dependent in [facility] to main gym for time/energy management. Pt amb ~20 ft using RW with min A - pt practiced turning around with amb which she stated was very fatiguing. Pt required seated rest break. Pt then amb 32 ft using RW with CGA and +2 for WC follow, however, no turns during this bout. Pt requires VC and manual assist at times to RW as she tends to veer towards L. Once back in room, pt remained seated upright in WC, seat belt alarm set, and all needs within reach. Small hot packs placed along upper traps.  Therapy Documentation Precautions: Precautions Precautions: Fall Precaution/Restrictions Comments: R  hemiparesis Restrictions Weight Bearing Restrictions Per Provider Order: No  Therapy/Group: Individual Therapy  [name], PT, DPT 05/08/2024, 7:23 AM

## 2024-05-08 NOTE — Progress Notes (Signed)
 Hypoglycemic Event  CBG: 69  Treatment: 4 oz juice/soda  Symptoms: None  Follow-up CBG: [id number] CBG Result:121  Possible Reasons for Event: Unknown  Comments/MD notified:yes    Hannah [name] E Stephanye Finnicum

## 2024-05-08 NOTE — Progress Notes (Signed)
 Speech Language Pathology Daily Session Note  Patient Details  Name: Hannah [name] MRN: [id number] Date of Birth: February 17, 1966  Today's Date: 05/08/2024 SLP Individual Time: 0902-1000 SLP Individual Time Calculation (min): 58 min  Short Term Goals: Week 1: SLP Short Term Goal 1 (Week 1): Patient will solve mildly complex structured problems with min assist. SLP Short Term Goal 2 (Week 1): Patient will utilize internal/external memory aids to recall daily information with 80% accuracy given mod assist. SLP Short Term Goal 3 (Week 1): Patient will utilize SLOP speech intelligibility strategies at the phrase level 60% of the time with min assist.  Skilled Therapeutic Interventions:  Patient was seen in am to address cognitive re- training and speech intelligibility. Direct handoff from PT with pt alert and seated upright in WC upon SLP arrival. She was agreeable for session. Pt taken downstairs where portion of session was completed in gift shop. Pt challenged in various basic budgeting task which she completed with min A. In other minutes of session, SLP continued to address speech intelligibility. She recalled 3 of 4 components of SLOP speech intelligibility strategies. In a noisy and distracting environment, the atrium pt was engaged in a conversational exchange. Pt was ~80-90% intelligible and demonstrated independence with adjusting strategies when clarification needed. At conclusion of session, pt was returned to her room where she was left upright in [facility] with call button within reach ad chair alarm active. SLP to continue POC.   Pain Pain Assessment Pain Scale: 0-10 Pain Score: 0-No pain  Therapy/Group: Individual Therapy  Joane GORMAN Fuss 05/08/2024, 10:12 AM

## 2024-05-08 NOTE — Progress Notes (Signed)
 Occupational Therapy Weekly Progress Note  Patient Details  Name: Hannah [name] MRN: [id number] Date of Birth: 08/07/1965  Beginning of progress report period: May 01, 2024 End of progress report period: May 08, 2024   Patient has met 2 of 3 short term goals.  Pt is making gradual progress towards LTGs. She is able to bathe with min A, UB dress with min A, LB dress with mod A and completes toileting with mod A. In the past week, pt has been mostly limited by fatigue, nausea, and constipation. In addition, pt continues to demonstrate dynamic standing balance, functional endurance, gross motor coordination and cognitive (memory recall) deficits resulting in difficulty completing BADL tasks without increased physical assist. Pt will benefit from continued skilled OT services to focus on mentioned deficits. Family ed has been initiated with pt's son as of date though further will be needed in prep for DC.   Patient continues to demonstrate the following deficits: muscle weakness, decreased cardiorespiratoy endurance, decreased coordination and decreased motor planning, decreased visual acuity, decreased initiation, decreased problem solving, decreased safety awareness, decreased memory, and delayed processing, and decreased sitting balance, decreased standing balance, hemiplegia, and decreased balance strategies and therefore will continue to benefit from skilled OT intervention to enhance overall performance with BADL and Reduce care partner burden.  Patient progressing toward long term goals..  Continue plan of care.  OT Short Term Goals Week 1:  OT Short Term Goal 1 (Week 1): Pt will perform toilet transfers MIN A OT Short Term Goal 1 - Progress (Week 1): Met OT Short Term Goal 2 (Week 1): Pt will perform LB dressing MOD A OT Short Term Goal 2 - Progress (Week 1): Progressing toward goal OT Short Term Goal 3 (Week 1): Pt will improve sitting at EOB for 10 minutes to perform ADL task OT  Short Term Goal 3 - Progress (Week 1): Met Week 2:  OT Short Term Goal 1 (Week 2): STG = LTG due to ELOS    Jara Feider E Sabryn Preslar, MS, OTR/L  05/08/2024, 7:49 AM

## 2024-05-08 NOTE — Progress Notes (Signed)
 Patient ID: Hannah [name], female   DOB: August 08, 1965, 58 y.o.   MRN: [id number]  Attempted to make contact with patient's daughter, [name] to verify family education. No answer - voicemail left with a callback number.

## 2024-05-08 NOTE — Progress Notes (Signed)
" °   05/08/24 1706  Vitals  Temp 97.7 F (36.5 C)  Temp Source Oral  BP (!) 171/65  MAP (mmHg) 95  BP Location Left Arm  BP Method Automatic  Patient Position (if appropriate) Lying  Pulse Rate 79  Pulse Rate Source Monitor  ECG Heart Rate 78  Resp 12  Oxygen Therapy  SpO2 99 %  O2 Device Room Air  Patient Activity (if Appropriate) In bed  Pulse Oximetry Type Continuous  Oximetry Probe Site Changed No  During Treatment Monitoring  Blood Flow Rate (mL/min) 199 mL/min  Arterial Pressure (mmHg) -68.88 mmHg  Venous Pressure (mmHg) 144.44 mmHg  TMP (mmHg) 13.73 mmHg  Ultrafiltration Rate (mL/min) 398 mL/min  Dialysate Flow Rate (mL/min) 299 ml/min  Dialysate Potassium Concentration 2  Dialysate Calcium  Concentration 2.5  Duration of HD Treatment -hour(s) 3.5 hour(s)  Cumulative Fluid Removed (mL) per Treatment  500.1  Post Treatment  Dialyzer Clearance Lightly streaked  Hemodialysis Intake (mL) 0 mL  Liters Processed 73.5  Fluid Removed (mL) 500 mL  Tolerated HD Treatment Yes  AVG/AVF Arterial Site Held (minutes) 10 minutes  AVG/AVF Venous Site Held (minutes) 10 minutes   Received patient in bed to unit.  Alert and oriented.  Informed consent signed and in chart.   TX duration:  Patient tolerated well.  Transported back to the room  Alert, without acute distress.  Hand-off given to patient's nurse.   Access used: LUA Access issues: none  Total UF removed: 500 Medication(s) given: none Post HD VS: see above Post HD weight: n/a   Docia CHRISTELLA [facility] "

## 2024-05-09 LAB — GLUCOSE, CAPILLARY
Glucose-Capillary: 118 mg/dL — ABNORMAL HIGH (ref 70–99)
Glucose-Capillary: 96 mg/dL (ref 70–99)
Glucose-Capillary: 171 mg/dL — ABNORMAL HIGH (ref 70–99)
Glucose-Capillary: 196 mg/dL — ABNORMAL HIGH (ref 70–99)
Glucose-Capillary: 222 mg/dL — ABNORMAL HIGH (ref 70–99)

## 2024-05-09 MED ORDER — SENNA 8.6 MG PO TABS
1.0000 | ORAL_TABLET | Freq: Every day | ORAL | Status: DC
  Administered 2024-05-09 – 2024-05-12 (×4): 8.6 mg via ORAL
  Filled 2024-05-09 (×4): qty 1

## 2024-05-09 MED ORDER — LIDOCAINE 5 % EX PTCH
1.0000 | MEDICATED_PATCH | CUTANEOUS | Status: DC
  Administered 2024-05-09 – 2024-05-12 (×3): 1 via TRANSDERMAL
  Filled 2024-05-09 (×4): qty 1

## 2024-05-09 MED ORDER — AMLODIPINE BESYLATE 5 MG PO TABS: 5.0000 mg | ORAL_TABLET | Freq: Every day | ORAL | Status: DC

## 2024-05-09 MED ORDER — DOCUSATE SODIUM 100 MG PO CAPS
100.0000 mg | ORAL_CAPSULE | Freq: Every day | ORAL | Status: DC
  Administered 2024-05-09 – 2024-05-13 (×3): 100 mg via ORAL
  Filled 2024-05-09 (×5): qty 1

## 2024-05-09 MED ORDER — VITAMIN D 25 MCG (1000 UNIT) PO TABS
2000.0000 [IU] | ORAL_TABLET | Freq: Every day | ORAL | Status: DC
  Administered 2024-05-10 – 2024-05-13 (×3): 2000 [IU] via ORAL
  Filled 2024-05-09 (×4): qty 2

## 2024-05-09 NOTE — Patient Care Conference (Signed)
 Inpatient RehabilitationTeam Conference and Plan of Care Update Date: 05/09/2024   Time: 11:23 AM    Patient Name: Hannah [name]      Medical Record Number: [id number]  Date of Birth: 1965/07/17 Sex: Female         Room/Bed: 4W20C/4W20C-01 Payor Info: Payor: [employer] / Plan: [employer] MEDICARE / Product Type: *No Product type* /    Admit Date/Time:  04/30/2024  2:12 PM  Primary Diagnosis:  Small vessel stroke [facility])  Hospital Problems: Principal Problem:   Small vessel stroke (HCC) Active Problems:   Cognitive and neurobehavioral dysfunction    Expected Discharge Date: Expected Discharge Date: 05/13/24  Team Members Present: Physician leading conference: Dr. Sven Elks Social Worker Present: Waverly Gentry, LCSW-A Nurse Present: Barnie Ronde, RN PT Present: Therisa Stains, PT OT Present: Monica Peacock, OT     Current Status/Progress Goal Weekly Team Focus  Bowel/Bladder   Pt is continent of B&B   Pt  will remain  continent of B&B   Pt will be provided assistance with  elimination needs to maintain continent status    Swallow/Nutrition/ Hydration               ADL's   Min A UB, Mod A LB and mod A toileting   MIN A   Delayed processing, dynamic balance, functional endurance    Mobility   bed mobility supervision/min A, transfers with RW min/mod A, gait 32ft with RW min/mod A   CGA/min A  barriers: poor vision, new R hemi w/ old L hemi, required assist at baseline, decreased balance/coordination, global weakness/deconditioning    Communication   mild dysarthria- son present and states speech is better than baseline at this point, pt recalls speech intelligibility strategies with sup- min A   min A   consistente use of strategies    Safety/Cognition/ Behavioral Observations  mild cognitive deficits in areas of processing, problem solving and memory   supervision- min A   problem solving, memory strategies and implementation    Pain   Pt c/o  intermittent pain to her back and buttocks   Pt pain will be 3 or less on the pain scale   Pt will be assessed for pain Q 2 hrs and medicated per MD orders to ensure pain is less than 3    Skin   Skin is intact with no alterations to skin integrity   Skin will remain intact  Pt will be turned Q 2hrs for positioning, comfort and skin integrity      Discharge Planning:  Plans to go home with daughter, [name] (works during the day) and son [name] (works night) roatating for care. Family ed has occured with [name] - awaiting response from [facility] Has specialty wheelchair and other DME. Family is following up on Medicaid application. Awaiting follow-up therapy recommendations.   Team Discussion: Patient admitted post small vessel stroke with history of CVA and left hemiparesis. Progress limited by dysarthria, global weakness, slow mobility and decreased processing with visual deficits. Evaluations delayed due to chest pain with elevated Troponin level upon admission and issues with nausea/constipation and right lower extremity pain. Patient limited by pain and stiffness with soft BP and weakness.  Continue to work on balance and endurance tolerance with bilateral hemiparesis and delayed processing.   Patient on target to meet rehab goals: yes, currently needs supervision - min assist for bed mobility and mod assist for lower body care and toileting. Needs min assist for transfers due to posterior  bias. Able to ambulate up to 4' using a RW with CGA.  Goals for discharge set for min - CGA assist overall.  *See Care Plan and progress notes for long and short-term goals.   Revisions to Treatment Plan:  N/a   Teaching Needs: Safety, medications, transfers, toileting, etc.   Current Barriers to Discharge: Decreased caregiver support, Home enviroment access/layout, and Hemodialysis  Possible Resolutions to Barriers: Family education HH follow up services     Medical Summary Current Status: type  2 diabetes with hyper and hypoglycemia, CVA, fluctuating hypo and hypertension, constipation, coccyx pain  Barriers to Discharge: Medical stability  Barriers to Discharge Comments: type 2 diabetes with hyper and hypoglycemia, CVA, fluctuating hypo and hypertension, constipation, coccyx pain Possible Resolutions to [employer]: continue insulin , continue aspirin , continue to monitor BP TID, continue amlodipine  5mg  daily, add senna HS, d/c norco, lidoderm  patch and kpad ordered, asked nursing to please apply heat every morning   Continued Need for Acute Rehabilitation Level of Care: The patient requires daily medical management by a physician with specialized training in physical medicine and rehabilitation for the following reasons: Direction of a multidisciplinary physical rehabilitation program to maximize functional independence : Yes Medical management of patient stability for increased activity during participation in an intensive rehabilitation regime.: Yes Analysis of laboratory values and/or radiology reports with any subsequent need for medication adjustment and/or medical intervention. : Yes   I attest that I was present, lead the team conference, and concur with the assessment and plan of the team.   Fredericka Sober B 05/09/2024, 12:16 PM

## 2024-05-09 NOTE — Progress Notes (Signed)
 Physical Therapy Weekly Progress Note  Patient Details  Name: Hannah [name] MRN: [id number] Date of Birth: 01/20/1966  Beginning of progress report period: May 01, 2024 End of progress report period: May 09, 2024  Patient has met 1 of 3 short term goals. Pt demonstrates slow progress towards long term goals. Pt is currently able to transfer semi-reclined<>sitting EOB with supervision using bed features, sit<>supine with min A for BLE management, sit<>stands and stand<>pivot transfers with RW and as little as min A (but can require up to mod A when fatigued), and ambulate 104ft with RW and CGA. Pt required assist for mobility and ADLs at baseline and remains limited by bilateral hemiparesis, visual impairments, decreased balance/coordination (posterior bias), and global weakness/deconditioning. Pt's daughter to participate in family education training this afternoon.   Patient continues to demonstrate the following deficits muscle weakness, decreased cardiorespiratoy endurance, impaired timing and sequencing, abnormal tone, unbalanced muscle activation, decreased coordination, and decreased motor planning, decreased visual acuity, decreased motor planning, and decreased standing balance, decreased postural control, hemiplegia, and decreased balance strategies and therefore will continue to benefit from skilled PT intervention to increase functional independence with mobility.  Patient progressing toward long term goals..  Continue plan of care.  PT Short Term Goals Week 1:  PT Short Term Goal 1 (Week 1): pt will transfer supine<>sitting EOB with min A consistantly PT Short Term Goal 1 - Progress (Week 1): Met PT Short Term Goal 2 (Week 1): pt will transfer sit<>stand with LRAD and min A consistantly PT Short Term Goal 2 - Progress (Week 1): Progressing toward goal PT Short Term Goal 3 (Week 1): pt will perform bed<>chair transfer with LRAD and min A consistantly PT Short Term Goal 3 -  Progress (Week 1): Progressing toward goal Week 2:  PT Short Term Goal 1 (Week 2): STG=LTG due to LOS  Skilled Therapeutic Interventions/Progress Updates:  Ambulation/gait training;DME/adaptive equipment instruction;Psychosocial support;UE/LE Strength taining/ROM;Balance/vestibular training;Functional electrical stimulation;Skin care/wound management;UE/LE Coordination activities;Cognitive remediation/compensation;Functional mobility training;Splinting/orthotics;Visual/perceptual remediation/compensation;Community reintegration;Neuromuscular re-education;Wheelchair propulsion/positioning;Discharge planning;Pain management;Therapeutic Activities;Disease management/prevention;Patient/family education;Therapeutic Exercise;Stair training   Therapy Documentation Precautions:  Precautions Precautions: Fall Precaution/Restrictions Comments: R hemiparesis Restrictions Weight Bearing Restrictions Per Provider Order: No  Therapy/Group: Individual Therapy Therisa HERO Zaunegger Therisa Stains PT, DPT 05/09/2024, 7:19 AM

## 2024-05-09 NOTE — Progress Notes (Signed)
 Physical Therapy Session Note  Patient Details  Name: Hannah [name] MRN: [id number] Date of Birth: 26-Dec-1965  Today's Date: 05/09/2024 PT Individual Time: 1400-1454 PT Individual Time Calculation (min): 54 min   Short Term Goals: Week 1:  PT Short Term Goal 1 (Week 1): pt will transfer supine<>sitting EOB with min A consistantly PT Short Term Goal 2 (Week 1): pt will transfer sit<>stand with LRAD and min A consistantly PT Short Term Goal 3 (Week 1): pt will perform bed<>chair transfer with LRAD and min A consistantly  Skilled Therapeutic Interventions/Progress Updates:   Received pt sitting in [facility] with son present. Per son, pt's daughter will be here Friday for family education and pt politely declined any hands on practice today due to R biceps tear and awaiting surgery. Pt agreeable to PT treatment and denied any pain during session. Session with emphasis on discharge planning, functional mobility/transfers, generalized strengthening and endurance, dynamic standing balance/coordination, and simulated car transfers. Of note, at baseline pt required assist for ADL's, transfers, and mobility and pt's children familiar with LOS required.  Went through sensation, MMT, and pain interference questionnaire in preparation for discharge. Pt performed WC mobility 167ft using BUE and LUE with supervision/mod I and ++ time and encouragement with emphasis on cardiovascular endurance, coordination, and NMR. Pt engaged with pet therapy dog Dixie to uplift mood/spirits. Transported to ortho gym and performed simulated car transfer with RW and min A overall with ++ time and cues for safety/turning technique. Pt required mod A to stand from [facility] due to fatigue - reported 9/10 fatigue after car transfer therefore deferred any further standing activities. Pt performed seated BLE strengthening on Kinetron at 20 cm/sec for 1 minute x 4 trials with emphasis on glute/quad strength. Returned to room and performed  stand<>pivot to bed with RW and mod A. Transitioned into supine with supervision and concluded session with pt semi-reclined in bed with all needs and son at bedside.   Therapy Documentation Precautions:  Precautions Precautions: Fall Precaution/Restrictions Comments: R hemiparesis Restrictions Weight Bearing Restrictions Per Provider Order: No  Therapy/Group: Individual Therapy Therisa HERO Zaunegger Therisa Stains PT, DPT 05/09/2024, 6:53 AM

## 2024-05-09 NOTE — Progress Notes (Signed)
 Patient ID: Hannah [name], female   DOB: 04-Sep-1965, 58 y.o.   MRN: [id number]  Patient's daughter [name] has agreed to family education today, Friday between 9am-12pm and Saturday 9am-12pm.

## 2024-05-09 NOTE — Progress Notes (Signed)
 Occupational Therapy Session Note  Patient Details  Name: Hannah [name] MRN: [id number] Date of Birth: 04-05-1966  Today's Date: 05/09/2024 OT Individual Time: 9043-8948 OT Individual Time Calculation (min): 55 min    Short Term Goals: Week 2:  OT Short Term Goal 1 (Week 2): STG = LTG due to ELOS  Skilled Therapeutic Interventions/Progress Updates:  Skilled OT intervention completed with focus on family education. Pt received in standing with PT. Direct care handoff to OT. Pt agreeable to session. No pain reported.  Transported pt dependently > gym for christmas sing along. Pt participated in vocalizations and visual scanning tasks throughout.  Pts daughter present. Transported > gym for hands on education. Demo provided on squat pivot transfer for modified transfer when pt is fatigued or as alternative to the heavier assist ambulatory transfer. OT demonstrated min A squat pivot w/c <> EOM with max cues for body positioning. Daughter was able to return demo similar transfer, but OT required to assist with cueing pt and also for transfer as pt required max A with daughter. Discussed pt's learned dependence on her family to lift her vs doing as independently as possible, as well as pt's fatigue level with repetition. Discussed toileting arrangements in bedroom vs bathroom when energy is low or pt not helping as much for both of their safety. Will need to have daughter practice more as well as with ambulatory transfers.  Pt endorsed feeling funny. Nurse present for CBG check- 96. Issued juice per nurse for pt comfort. Pt also endorsed nausea and nurse made aware. Back in room, pt remained seated in w/c, with daughter present and with all needs in reach at end of session.   Therapy Documentation Precautions:  Precautions Precautions: Fall Precaution/Restrictions Comments: R hemiparesis Restrictions Weight Bearing Restrictions Per Provider Order: No    Therapy/Group: Individual  Therapy  Cassie Shedlock E Vinicio Lynk, MS, OTR/L  05/09/2024, 11:02 AM

## 2024-05-09 NOTE — Plan of Care (Signed)
" °  Problem: Consults Goal: RH STROKE PATIENT EDUCATION Description: See Patient Education module for education specifics  Outcome: Progressing Goal: Diabetes Guidelines if Diabetic/Glucose > 140 Description: If diabetic or lab glucose is > 140 mg/dl - Initiate Diabetes/Hyperglycemia Guidelines & Document Interventions  Outcome: Progressing   Problem: RH BOWEL ELIMINATION Goal: RH STG MANAGE BOWEL WITH ASSISTANCE Description: STG Manage Bowel with mod I Assistance. Outcome: Progressing Goal: RH STG MANAGE BOWEL W/MEDICATION W/ASSISTANCE Description: STG Manage Bowel with Medication with mod I Assistance. Outcome: Progressing   Problem: RH BLADDER ELIMINATION Goal: RH STG MANAGE BLADDER WITH ASSISTANCE Description: STG Manage Bladder With toileting Assistance Outcome: Progressing   Problem: RH SAFETY Goal: RH STG ADHERE TO SAFETY PRECAUTIONS W/ASSISTANCE/DEVICE Description: STG Adhere to Safety Precautions With cues Assistance/Device. Outcome: Progressing   Problem: RH KNOWLEDGE DEFICIT Goal: RH STG INCREASE KNOWLEDGE OF DIABETES Description: Patient and dtr will be able to manage DM using educational resources for medications and dietary modification independently Outcome: Progressing Goal: RH STG INCREASE KNOWLEDGE OF HYPERTENSION Description: Patient and dtr will be able to manage HTN using educational resources for medications and dietary modification independently Outcome: Progressing Goal: RH STG INCREASE KNOWLEGDE OF HYPERLIPIDEMIA Description: Patient and dtr will be able to manage HLD using educational resources for medications and dietary modification independently Outcome: Progressing Goal: RH STG INCREASE KNOWLEDGE OF STROKE PROPHYLAXIS Description: Patient and dtr will be able to manage secondary risks using educational resources for medications and dietary modification independently Outcome: Progressing

## 2024-05-09 NOTE — Progress Notes (Signed)
 Occupational Therapy Note  Patient Details  Name: Hannah [name] MRN: [id number] Date of Birth: 01/05/1966   Occupational Therapist participated in the interdisciplinary team conference, providing clinical information regarding the patient's current status, treatment goals, and weekly focus, including any barriers that need to be addressed. Please see the Inpatient Rehabilitation Team Conference and Plan of Care Update for further details.    Eros Montour M 05/09/2024, 11:27 AM

## 2024-05-09 NOTE — Progress Notes (Signed)
" °  [facility] Progress Note   Subjective:   Seen in room - no CP/dyspnea. Dialyzed yesterday without issue - net UF 0.5L.   Objective Vitals:   05/08/24 1706 05/08/24 2100 05/09/24 0500 05/09/24 0521  BP: (!) 171/65 (!) 148/64  (!) 121/55  Pulse: 79 83  78  Resp: 12 17  18   Temp: 97.7 F (36.5 C) 98.6 F (37 C)  98.4 F (36.9 C)  TempSrc: Oral Oral  Oral  SpO2: 99% 98%  100%  Weight:   50.8 kg   Height:       Physical Exam General: Thin but well appearing, NAD. Room air Heart: RRR; no murmur Lungs: CTA anteriorly Abdomen: soft Extremities: no LE edema, reduced muscle mass Dialysis Access: R AVG +bruit  Additional Objective Labs: Basic Metabolic Panel: Recent Labs  Lab 05/04/24 0623 05/06/24 0630 05/08/24 1353  NA 135 136 134*  K 4.9 4.8 4.2  CL 97* 98 97*  CO2 28 29 27   GLUCOSE 112* 73 156*  BUN 34* 36* 35*  CREATININE 5.93* 5.67* 5.16*  CALCIUM  9.6 9.3 9.0  PHOS 4.2 4.0 3.6   Liver Function Tests: Recent Labs  Lab 05/04/24 0623 05/06/24 0630 05/08/24 1353  ALBUMIN 3.7 3.6 3.6   CBC: Recent Labs  Lab 05/03/24 0526 05/04/24 0623 05/06/24 0630 05/08/24 1353  WBC 6.2 5.8 4.9 4.0  NEUTROABS 4.7 4.3 3.6  --   HGB 8.8* 8.8* 8.5* 8.1*  HCT 27.3* 27.0* 26.1* 25.1*  MCV 96.5 97.1 96.7 96.5  PLT 150 150 137* 93*   Medications:   amLODipine   10 mg Oral Daily   aspirin  EC  81 mg Oral Daily   azaTHIOprine   50 mg Oral Daily   B-complex with vitamin C  1 tablet Oral Daily   calcium  acetate  667 mg Oral TID WC   carvedilol   25 mg Oral BID WC   Chlorhexidine  Gluconate Cloth  6 each Topical Q0600   cholecalciferol   1,000 Units Oral Daily   [START ON 05/11/2024] darbepoetin (ARANESP ) injection - DIALYSIS  40 mcg Subcutaneous Q Fri-1800   famotidine   20 mg Oral QHS   hydrALAZINE   50 mg Oral TID   insulin  glargine  10 Units Subcutaneous Daily   isosorbide  mononitrate  30 mg Oral QHS   l-methylfolate-B6-B12  1 tablet Oral Daily   lidocaine    1 patch Transdermal Q24H   lidocaine   1 patch Transdermal Q24H   polyethylene glycol  17 g Oral BID   rosuvastatin   20 mg Oral Daily   ticagrelor   90 mg Oral BID    Dialysis Orders AF MWF  3:30hr, BFR 350, EDW 45.4kg, 2K/2Ca AVG 16g No heparin   -No ESA  -Hectorol  1 mcg q HD   Assessment/Plan: Acute CVA. H/o prior strokes. 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 Management per neuro. In CIR ESRD: Usual MWF schedule - next HD Friday (back to usual). Prolonged bleeding from AVG: SQ heparin  held with resolution.  Continue to monitor. Consider a F'gram if bleeding recurs. Hypertension/volume: BP to goal, no edema. Anemia of ESRD: Hgb down, will begin Aranesp  40mcg weekly on 12/26. Metabolic bone disease: Ca/Phos ok - continue home VDRA + binders.  T2DM: On insulin , per primary. 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 Dispo: Per notes, plan for discharge on 12/28. Per outpatient clinic is not running holiday schedule next week so should be ok.   Izetta Boehringer, PA-C 05/09/2024, 8:52 AM  [facility]    "

## 2024-05-09 NOTE — Progress Notes (Signed)
 Patient ID: Hannah [name], female   DOB: Feb 16, 1966, 58 y.o.   MRN: [id number]  Have reviewed team conference with pt and family. Both aware and agreeable with targeted d/c date of 12/28 and goals of Supervision/Verbal cueing.

## 2024-05-09 NOTE — Progress Notes (Signed)
 Physical Therapy Session Note  Patient Details  Name: Hannah [name] MRN: [id number] Date of Birth: 31-Oct-1965  Today's Date: 05/09/2024 PT Individual Time: 9068-9043 PT Individual Time Calculation (min): 25 min  and Today's Date: 05/09/2024 PT Missed Time: 20 Minutes Missed Time Reason: Other (Comment);Patient unwilling to participate (eating breakfast that she received late)  Short Term Goals: Week 1:  PT Short Term Goal 1 (Week 1): pt will transfer supine<>sitting EOB with min A consistantly PT Short Term Goal 1 - Progress (Week 1): Met PT Short Term Goal 2 (Week 1): pt will transfer sit<>stand with LRAD and min A consistantly PT Short Term Goal 2 - Progress (Week 1): Progressing toward goal PT Short Term Goal 3 (Week 1): pt will perform bed<>chair transfer with LRAD and min A consistantly PT Short Term Goal 3 - Progress (Week 1): Progressing toward goal Week 2:  PT Short Term Goal 1 (Week 2): STG=LTG due to LOS  Skilled Therapeutic Interventions/Progress Updates:  Patient supine in bed on entrance to room and eating breakfast. Pt request time to complete breakfast since she received it so late.   Returned later and pt completed breakfast and ready for therapy. Dtr has assisted pt with dressing for day. Patient alert, happy, and agreeable to PT session.   Patient with no pain complaint at start of session.  Therapeutic Activity: Bed Mobility: Pt performed supine > sit with supervision from elevated HOB. VC/ tc required for maintaining seated balance while PT donned shoes MaxA. With lift of RLE, pt loses balance to R requiring Min/ ModA to correct. Lateral scoot along EOB requires Mod A with vc/ tc throughout for forward scoot, feet on floor, anterior lean into weight shift and Min/ ModA to scoot.  Transfers: Pt performed sit<>stand transfer from EOB to RW with less forward lean requiring Mod A to attain upright posture/ balance and maintain with walker. Once balanced, pt able to stand  at [facility] with light CGA. Provided vc/ tc for sequencing of technique.  Gait Training:  Pt ambulated 8 ft using RW with MinA for balance toward w/c. Completes 180* turn to sit at w/c with MinA for balance and vc/ tc for sequencing RLE>LLE direction of turn. Demos flexed forward posture and continues hold RW with rotation to L and close proximity to R side. Pt does not catch RLE on RW, however. Provided vc/ tc for sequencing pivot steps. Sits to w/c with vc for reaching back to armrest and slow descent to sit with light MinA.   Patient seated upright in w/c at end of session with brakes locked, no alarm set, and all needs within reach. Hand off of care to OT arriving for next session.    Therapy Documentation Precautions:  Precautions Precautions: Fall Precaution/Restrictions Comments: R hemiparesis Restrictions Weight Bearing Restrictions Per Provider Order: No General: PT Amount of Missed Time (min): 20 Minutes PT Missed Treatment Reason: Other (Comment);Patient unwilling to participate (eating breakfast that she received late)  Pain: Pain Assessment Pain Scale: 0-10 Pain Score: 0-No pain related this session.    Therapy/Group: Individual Therapy  Mliss DELENA Milliner PT, DPT, CSRS 05/09/2024, 3:11 PM

## 2024-05-09 NOTE — Progress Notes (Signed)
 "                                                        PROGRESS NOTE   Subjective/Complaints: No new complaints this morning Discussed that CBGs are improved, can d/c her ISS since she will also not be getting that at home    05/09/2024    5:21 AM 05/09/2024    5:00 AM 05/08/2024    9:00 PM  Vitals with BMI  Weight  112 lbs   BMI  20.22   Systolic 121  148  Diastolic 55  64  Pulse 78  83    Recent Labs    05/08/24 1914 05/08/24 2121 05/09/24 0618  GLUCAP 131* 137* 118*   ROS: Positives per HPI above. Denies fevers, chills, SOB, chest pain, new weakness or paraesthesias.   nausea ,  constipation--ongoing, +insomnia, denies pain   Objective:   No results found. Recent Labs    05/08/24 1353  WBC 4.0  HGB 8.1*  HCT 25.1*  PLT 93*   Recent Labs    05/08/24 1353  NA 134*  K 4.2  CL 97*  CO2 27  GLUCOSE 156*  BUN 35*  CREATININE 5.16*  CALCIUM  9.0     Intake/Output Summary (Last 24 hours) at 05/09/2024 9046 Last data filed at 05/09/2024 0900 Gross per 24 hour  Intake 300 ml  Output 500 ml  Net -200 ml     Wound 04/30/24 1723 Pressure Injury Sacrum Left Stage 2 -  Partial thickness loss of dermis presenting as a shallow open injury with a red, pink wound bed without slough. (Active)    Physical Exam: Vital Signs Blood pressure (!) 121/55, pulse 78, temperature 98.4 F (36.9 C), temperature source Oral, resp. rate 18, height 5' 2.4 (1.585 m), weight 50.8 kg, SpO2 100%.  Gen: no distress, normal appearing. Sitting up in chair HEENT: oral mucosa pink and moist, NCAT Cardio: Reg rate and rhythm. No m/r/g.  Chest: normal effort, normal rate of breathing. CTAB.  Abd: soft, non-distended, +BS hypoactive; TTP bilaterally diffusely Ext: no edema.  Psych: pleasant, normal affect Skin: intact in visible surfaces   Neuro: +generalized muscle wasting, +ataxic speech- improving, 4-/5 strength on the right side, otherwise strength is 5/5   Physical exam  unchanged from the above on reexamination 05/09/2024    Assessment/Plan: 1. Functional deficits which require 3+ hours per day of interdisciplinary therapy in a comprehensive inpatient rehab setting. Physiatrist is providing close team supervision and 24 hour management of active medical problems listed below. Physiatrist and rehab team continue to assess barriers to discharge/monitor patient progress toward functional and medical goals  Care Tool:  Bathing    Body parts bathed by patient: Right arm, Chest, Abdomen, Right upper leg, Left upper leg, Face   Body parts bathed by helper: Left arm, Buttocks, Front perineal area, Right lower leg, Left lower leg     Bathing assist Assist Level: Maximal Assistance - Patient 24 - 49%     Upper Body Dressing/Undressing Upper body dressing   What is the patient wearing?: Hospital gown only    Upper body assist Assist Level: Maximal Assistance - Patient 25 - 49%    Lower Body Dressing/Undressing Lower body dressing      What is the patient wearing?: Underwear/pull up  Lower body assist Assist for lower body dressing: Total Assistance - Patient < 25%     Toileting Toileting    Toileting assist Assist for toileting: Maximal Assistance - Patient 25 - 49%     Transfers Chair/bed transfer  Transfers assist     Chair/bed transfer assist level: Moderate Assistance - Patient 50 - 74%     Locomotion Ambulation   Ambulation assist      Assist level: Moderate Assistance - Patient 50 - 74% Assistive device: Walker-rolling Max distance: 21ft   Walk 10 feet activity   Assist  Walk 10 feet activity did not occur: Safety/medical concerns (fatigue, weakness/deconditioning)  Assist level: Moderate Assistance - Patient - 50 - 74% Assistive device: Walker-rolling   Walk 50 feet activity   Assist Walk 50 feet with 2 turns activity did not occur: Safety/medical concerns (fatigue, weakness/deconditioning)  Assist level:  Moderate Assistance - Patient - 50 - 74% Assistive device: Walker-Eva    Walk 150 feet activity   Assist Walk 150 feet activity did not occur: Safety/medical concerns         Walk 10 feet on uneven surface  activity   Assist Walk 10 feet on uneven surfaces activity did not occur: Safety/medical concerns (fatigue, weakness/deconditioning)         Wheelchair     Assist Is the patient using a wheelchair?: Yes Type of Wheelchair: Manual    Wheelchair assist level: Supervision/Verbal cueing Max wheelchair distance: 16'    Wheelchair 50 feet with 2 turns activity    Assist        Assist Level: Supervision/Verbal cueing   Wheelchair 150 feet activity     Assist      Assist Level: Maximal Assistance - Patient 25 - 49%   Blood pressure (!) 121/55, pulse 78, temperature 98.4 F (36.9 C), temperature source Oral, resp. rate 18, height 5' 2.4 (1.585 m), weight 50.8 kg, SpO2 100%.  Medical Problem List and Plan: 1. Functional deficits secondary to RIght dorsal midbrain infarct             -patient may  shower             -ELOS/Goals: 10-14d sup/minA--estimated discharge 12-28  Chart and therapy notes reviewed, continue CIR, discussed patient's progress with therapy Grounds pass ordered Vitamin D3/Metanx/Vitamin B/C complex ordered F/u with me or Fidela in clinic within 1 month Would benefit from home health aide upon discharge  This patient is capable of making decisions on her own behalf. Updated daughter  2.  Anemia: Heparin  d/ced due to bleeding from fistula site, Hgb reviewed and is now stable, stool occult reviewed and is negative             -antiplatelet therapy: continue ASA/Ticagrelor   3. H/o Sacroiliitis: Tylenol  prn mild pain. D/c norco, lidocaine  patch ordered, kpad ordered  4. Insomnia: scheduled melatonin 5mg  HS   5. T2DM:  Hgb A1c-  8.5. Monitor BS ac/hs             --was on insulin  glargine 12 units w/ 5 units novolog  TID. Increase  glargine to 11U but patient refuses this dose and wants 10U due to past experiences with hypoglycemia- changed back             --will continue to monitor BS for trend and titrate insulin  as needed.   D/c ISS Recent Labs    05/08/24 1914 05/08/24 2121 05/09/24 0618  GLUCAP 131* 137* 118*      6.  ESRD: HD MWF at the end of the day to help with tolerance of therapy.              --Phoslo  tid ac for metabolic bone disease.   Continue fluid restriction--labs stable  7. Hypertension: increase amlodipine  to 10mg  daily, continue coreg , hydralazine  and imdur .  12/22: Vital stable  8. H/o Chorioretinitis: continue azithropine daily. .   9. Constipation: LBM 12/24, senna ordered HS  10. GERD: continue pepcid   LOS: 9 days A FACE TO FACE EVALUATION WAS PERFORMED  Sven SQUIBB Daryana Whirley 05/09/2024, 9:53 AM     "

## 2024-05-09 NOTE — Progress Notes (Signed)
 Physical Therapy Session Note  Patient Details  Name: Hannah [name] MRN: [id number] Date of Birth: 1966/04/29  Today's Date: 05/09/2024 PT Individual Time: 8696-8652 PT Individual Time Calculation (min): 44 min   Short Term Goals: Week 1:  PT Short Term Goal 1 (Week 1): pt will transfer supine<>sitting EOB with min A consistantly PT Short Term Goal 1 - Progress (Week 1): Met PT Short Term Goal 2 (Week 1): pt will transfer sit<>stand with LRAD and min A consistantly PT Short Term Goal 2 - Progress (Week 1): Progressing toward goal PT Short Term Goal 3 (Week 1): pt will perform bed<>chair transfer with LRAD and min A consistantly PT Short Term Goal 3 - Progress (Week 1): Progressing toward goal Week 2:  PT Short Term Goal 1 (Week 2): STG=LTG due to LOS  Skilled Therapeutic Interventions/Progress Updates:  Patient supine in bed on entrance to room. Patient alert and agreeable to PT session. Is on phone upon entering room. Son sitting in recliner in corner of room.   Patient with no pain complaint at start of session.  Pt relates being on phone with son but wounding like son was in the room with her. Asked pt leading/ questioning cues until she looked around and saw son in corner. Both laughing.   Therapeutic Activity: Bed Mobility: Pt performed supine > sit with supervision/ light CGA to reach sidelying from elevated HOB and then push up to seated position. VC required for effort to push with BUE and then to lean with increased muscle activation in core in order to reach upright trunk position. Is able to scoot forward with effort. Continues to lose balance to R with lift of RLE to don shoe with MaxA.  Transfers: Pt performed sit<>stand transfers to RW throughout session with MinA for maintaining forward weight shift until balance moves from seat to feet.  Provided vc/ tc for split BUE positioning with unaffected arm pushing from armrest.   Gait Training/ NMR:  Pt set up with 6 cones  placed every 3-4 ft and instructed to kick cones with RLE. Since pt has R hemibody weakness >L, pt tending to hold RW closer to R side and slightly more forward than L. Despite this position, pt noted to clear R foot from walker during swing phase but activity to attempt to increased motor control and muscle fiber recruitment. She ambulated 15 ft prior to needing seated rest break. Throughout first bout, pt able to approach and position in front of cone, then draw back RLE with some hip extension and knee flexion. Good strong kick to move cone on each attempt. Demonstrated need for increased effort to position in front of each cone, BUT is able to reach with no cueing  and CGA only for maintaining upright balance. Completes remaining cones after seated rest break. Requires extra time to complete with overall CGA.   NMR performed for improvements in motor control and coordination, balance, sequencing, judgement, and self confidence/ efficacy in performing all aspects of mobility at highest level of independence.   Patient seated upright in w/c at end of session with brakes locked, belt alarm set, and all needs within reach. Oriented to time and time of next session.    Therapy Documentation Precautions:  Precautions Precautions: Fall Precaution/Restrictions Comments: R hemiparesis Restrictions Weight Bearing Restrictions Per Provider Order: No General: PT Amount of Missed Time (min): 20 Minutes PT Missed Treatment Reason: Other (Comment);Patient unwilling to participate (eating breakfast that she received late)  Pain: Pain Assessment Pain  Scale: 0-10 Pain Score: 0-No pain   Therapy/Group: Individual Therapy  Mliss DELENA Milliner PT, DPT, CSRS 05/09/2024, 3:28 PM

## 2024-05-10 LAB — GLUCOSE, CAPILLARY
Glucose-Capillary: 125 mg/dL — ABNORMAL HIGH (ref 70–99)
Glucose-Capillary: 125 mg/dL — ABNORMAL HIGH (ref 70–99)
Glucose-Capillary: 235 mg/dL — ABNORMAL HIGH (ref 70–99)
Glucose-Capillary: 312 mg/dL — ABNORMAL HIGH (ref 70–99)

## 2024-05-10 MED ORDER — AMLODIPINE BESYLATE 10 MG PO TABS
10.0000 mg | ORAL_TABLET | Freq: Every day | ORAL | Status: DC
  Administered 2024-05-12 – 2024-05-13 (×2): 10 mg via ORAL
  Filled 2024-05-10 (×3): qty 1

## 2024-05-10 NOTE — Plan of Care (Signed)
" °  Problem: Consults Goal: RH STROKE PATIENT EDUCATION Description: See Patient Education module for education specifics  Outcome: Progressing Goal: Diabetes Guidelines if Diabetic/Glucose > 140 Description: If diabetic or lab glucose is > 140 mg/dl - Initiate Diabetes/Hyperglycemia Guidelines & Document Interventions  Outcome: Progressing   Problem: RH BOWEL ELIMINATION Goal: RH STG MANAGE BOWEL WITH ASSISTANCE Description: STG Manage Bowel with mod I Assistance. Outcome: Progressing Goal: RH STG MANAGE BOWEL W/MEDICATION W/ASSISTANCE Description: STG Manage Bowel with Medication with mod I Assistance. Outcome: Progressing   Problem: RH BLADDER ELIMINATION Goal: RH STG MANAGE BLADDER WITH ASSISTANCE Description: STG Manage Bladder With toileting Assistance Outcome: Progressing   Problem: RH SAFETY Goal: RH STG ADHERE TO SAFETY PRECAUTIONS W/ASSISTANCE/DEVICE Description: STG Adhere to Safety Precautions With cues Assistance/Device. Outcome: Progressing   Problem: RH KNOWLEDGE DEFICIT Goal: RH STG INCREASE KNOWLEDGE OF DIABETES Description: Patient and dtr will be able to manage DM using educational resources for medications and dietary modification independently Outcome: Progressing Goal: RH STG INCREASE KNOWLEDGE OF HYPERTENSION Description: Patient and dtr will be able to manage HTN using educational resources for medications and dietary modification independently Outcome: Progressing Goal: RH STG INCREASE KNOWLEGDE OF HYPERLIPIDEMIA Description: Patient and dtr will be able to manage HLD using educational resources for medications and dietary modification independently Outcome: Progressing Goal: RH STG INCREASE KNOWLEDGE OF STROKE PROPHYLAXIS Description: Patient and dtr will be able to manage secondary risks using educational resources for medications and dietary modification independently Outcome: Progressing

## 2024-05-10 NOTE — Progress Notes (Signed)
 "                                                        PROGRESS NOTE   Subjective/Complaints: Feeling well this morning No new complaints Feels that she needs to have a BM soon, called nursing tech    05/10/2024    5:18 AM 05/09/2024   10:57 PM 05/09/2024    8:20 PM  Vitals with BMI  Weight 99 lbs    BMI 17.87    Systolic 150 148 851  Diastolic 67 65 65  Pulse 80  84    Recent Labs    05/09/24 1700 05/09/24 2117 05/10/24 0523  GLUCAP 196* 222* 125*   ROS: Positives per HPI above. Denies fevers, chills, SOB, chest pain, new weakness or paraesthesias.   Nausea, constipation is improved, +insomnia, denies pain   Objective:   No results found. Recent Labs    05/08/24 1353  WBC 4.0  HGB 8.1*  HCT 25.1*  PLT 93*   Recent Labs    05/08/24 1353  NA 134*  K 4.2  CL 97*  CO2 27  GLUCOSE 156*  BUN 35*  CREATININE 5.16*  CALCIUM  9.0     Intake/Output Summary (Last 24 hours) at 05/10/2024 0908 Last data filed at 05/10/2024 0838 Gross per 24 hour  Intake 700 ml  Output --  Net 700 ml         Physical Exam: Vital Signs Blood pressure (!) 150/67, pulse 80, temperature 98.3 F (36.8 C), resp. rate 16, height 5' 2.4 (1.585 m), weight 44.9 kg, SpO2 100%.  Gen: no distress, normal appearing. Sitting up in chair HEENT: oral mucosa pink and moist, NCAT Cardio: Reg rate and rhythm. No m/r/g.  Chest: normal effort, normal rate of breathing. CTAB.  Abd: soft, non-distended, +BS hypoactive; TTP bilaterally diffusely Ext: no edema.  Psych: pleasant, normal affect, bright and smiling always Skin: intact in visible surfaces   Neuro: +generalized muscle wasting, +ataxic speech- improving, 4-/5 strength on the right side, otherwise strength is 5/5   Physical exam unchanged from the above on reexamination 05/10/2024    Assessment/Plan: 1. Functional deficits which require 3+ hours per day of interdisciplinary therapy in a comprehensive inpatient rehab  setting. Physiatrist is providing close team supervision and 24 hour management of active medical problems listed below. Physiatrist and rehab team continue to assess barriers to discharge/monitor patient progress toward functional and medical goals  Care Tool:  Bathing    Body parts bathed by patient: Right arm, Chest, Abdomen, Right upper leg, Left upper leg, Face   Body parts bathed by helper: Left arm, Buttocks, Front perineal area, Right lower leg, Left lower leg     Bathing assist Assist Level: Maximal Assistance - Patient 24 - 49%     Upper Body Dressing/Undressing Upper body dressing   What is the patient wearing?: Hospital gown only    Upper body assist Assist Level: Maximal Assistance - Patient 25 - 49%    Lower Body Dressing/Undressing Lower body dressing      What is the patient wearing?: Underwear/pull up     Lower body assist Assist for lower body dressing: Total Assistance - Patient < 25%     Toileting Toileting    Toileting assist Assist for toileting: Maximal Assistance - Patient 25 -  49%     Transfers Chair/bed transfer  Transfers assist     Chair/bed transfer assist level: Minimal Assistance - Patient > 75%     Locomotion Ambulation   Ambulation assist      Assist level: Contact Guard/Touching assist Assistive device: Walker-rolling Max distance: 70ft   Walk 10 feet activity   Assist  Walk 10 feet activity did not occur: Safety/medical concerns (fatigue, weakness/deconditioning)  Assist level: Contact Guard/Touching assist Assistive device: Walker-rolling   Walk 50 feet activity   Assist Walk 50 feet with 2 turns activity did not occur: Safety/medical concerns (fatigue, weakness/deconditioning)  Assist level: Moderate Assistance - Patient - 50 - 74% Assistive device: Walker-Eva    Walk 150 feet activity   Assist Walk 150 feet activity did not occur: Safety/medical concerns         Walk 10 feet on uneven surface   activity   Assist Walk 10 feet on uneven surfaces activity did not occur: Safety/medical concerns (fatigue, weakness/deconditioning)         Wheelchair     Assist Is the patient using a wheelchair?: Yes Type of Wheelchair: Manual    Wheelchair assist level: Supervision/Verbal cueing Max wheelchair distance: 151ft    Wheelchair 50 feet with 2 turns activity    Assist        Assist Level: Supervision/Verbal cueing   Wheelchair 150 feet activity     Assist      Assist Level: Supervision/Verbal cueing   Blood pressure (!) 150/67, pulse 80, temperature 98.3 F (36.8 C), resp. rate 16, height 5' 2.4 (1.585 m), weight 44.9 kg, SpO2 100%.  Medical Problem List and Plan: 1. Functional deficits secondary to RIght dorsal midbrain infarct             -patient may  shower             -ELOS/Goals: 10-14d sup/minA--estimated discharge 12-28  Chart and therapy notes reviewed, continue CIR, discussed patient's progress with therapy Grounds pass ordered Vitamin D3/Metanx/Vitamin B/C complex ordered F/u with me or Fidela in clinic within 1 month Would benefit from home health aide upon discharge  This patient is capable of making decisions on her own behalf. Updated daughter  2.  Anemia: Heparin  d/ced due to bleeding from fistula site, Hgb reviewed and is now stable, stool occult reviewed and is negative             -antiplatelet therapy: continue ASA/Ticagrelor   3. H/o Sacroiliitis: Tylenol  prn mild pain. D/c norco, lidocaine  patch ordered, kpad ordered  4. Insomnia: scheduled melatonin 5mg  HS   5. T2DM:  Hgb A1c-  8.5. Monitor BS ac/hs             --was on insulin  glargine 12 units w/ 5 units novolog  TID. Increase glargine to 11U but patient refuses this dose and wants 10U due to past experiences with hypoglycemia- changed back             --will continue to monitor BS for trend and titrate insulin  as needed.   D/c ISS Recent Labs    05/09/24 1700  05/09/24 2117 05/10/24 0523  GLUCAP 196* 222* 125*      6. ESRD: HD MWF at the end of the day to help with tolerance of therapy.              --Phoslo  tid ac for metabolic bone disease.   Continue fluid restriction--labs stable  7. Hypertension: increase amlodipine  to 10mg  daily, continue coreg , hydralazine   and imdur .  8. H/o Chorioretinitis: continue azithropine daily. .   9. Constipation: LBM 12/24, senna ordered HS, discussed that she feels she needs to have a BM now  10. GERD: continue pepcid   LOS: 10 days A FACE TO FACE EVALUATION WAS PERFORMED  Alphonzo Devera P Analei Whinery 05/10/2024, 9:08 AM     "

## 2024-05-11 ENCOUNTER — Encounter (HOSPITAL_COMMUNITY): Payer: Self-pay | Admitting: Physical Medicine and Rehabilitation

## 2024-05-11 ENCOUNTER — Other Ambulatory Visit (HOSPITAL_COMMUNITY): Payer: Self-pay

## 2024-05-11 ENCOUNTER — Other Ambulatory Visit: Payer: Self-pay

## 2024-05-11 LAB — GLUCOSE, CAPILLARY
Glucose-Capillary: 192 mg/dL — ABNORMAL HIGH (ref 70–99)
Glucose-Capillary: 185 mg/dL — ABNORMAL HIGH (ref 70–99)
Glucose-Capillary: 300 mg/dL — ABNORMAL HIGH (ref 70–99)

## 2024-05-11 MED ORDER — CARVEDILOL 25 MG PO TABS
25.0000 mg | ORAL_TABLET | Freq: Two times a day (BID) | ORAL | 0 refills | Status: AC
  Filled 2024-05-11: qty 60, 30d supply, fill #0

## 2024-05-11 MED ORDER — FAMOTIDINE 20 MG PO TABS
20.0000 mg | ORAL_TABLET | Freq: Every day | ORAL | 0 refills | Status: AC
  Filled 2024-05-11: qty 30, 30d supply, fill #0

## 2024-05-11 MED ORDER — B COMPLEX-C PO TABS
1.0000 | ORAL_TABLET | Freq: Every day | ORAL | 0 refills | Status: AC
  Filled 2024-05-11: qty 30, 30d supply, fill #0

## 2024-05-11 MED ORDER — ONDANSETRON HCL 4 MG PO TABS
4.0000 mg | ORAL_TABLET | Freq: Three times a day (TID) | ORAL | 0 refills | Status: AC | PRN
  Filled 2024-05-11: qty 20, 7d supply, fill #0

## 2024-05-11 MED ORDER — MELATONIN 5 MG PO TABS
5.0000 mg | ORAL_TABLET | Freq: Every evening | ORAL | 0 refills | Status: AC | PRN
  Filled 2024-05-11: qty 30, 30d supply, fill #0

## 2024-05-11 MED ORDER — CALCIUM ACETATE (PHOS BINDER) 667 MG PO CAPS
667.0000 mg | ORAL_CAPSULE | Freq: Three times a day (TID) | ORAL | 0 refills | Status: AC
  Filled 2024-05-11: qty 90, 30d supply, fill #0

## 2024-05-11 MED ORDER — ROSUVASTATIN CALCIUM 20 MG PO TABS
20.0000 mg | ORAL_TABLET | Freq: Every day | ORAL | 0 refills | Status: AC
  Filled 2024-05-11: qty 30, 30d supply, fill #0

## 2024-05-11 MED ORDER — ISOSORBIDE MONONITRATE ER 30 MG PO TB24
30.0000 mg | ORAL_TABLET | Freq: Every day | ORAL | 0 refills | Status: AC
  Filled 2024-05-11: qty 30, 30d supply, fill #0

## 2024-05-11 MED ORDER — HYDRALAZINE HCL 50 MG PO TABS
50.0000 mg | ORAL_TABLET | Freq: Three times a day (TID) | ORAL | 0 refills | Status: AC
  Filled 2024-05-11: qty 90, 30d supply, fill #0

## 2024-05-11 MED ORDER — LIDOCAINE 5 % EX PTCH
1.0000 | MEDICATED_PATCH | CUTANEOUS | 0 refills | Status: AC
  Filled 2024-05-11: qty 30, 30d supply, fill #0

## 2024-05-11 MED ORDER — VITAMIN D3 25 MCG PO TABS
2000.0000 [IU] | ORAL_TABLET | Freq: Every day | ORAL | 0 refills | Status: AC
  Filled 2024-05-11: qty 60, 30d supply, fill #0

## 2024-05-11 MED ORDER — DOCUSATE SODIUM 100 MG PO CAPS: 100.0000 mg | ORAL_CAPSULE | Freq: Every day | ORAL | Status: AC

## 2024-05-11 MED ORDER — DICLOFENAC SODIUM 1 % EX GEL
2.0000 g | Freq: Four times a day (QID) | CUTANEOUS | 0 refills | Status: AC
  Filled 2024-05-11: qty 100, 30d supply, fill #0

## 2024-05-11 MED ORDER — TICAGRELOR 90 MG PO TABS
90.0000 mg | ORAL_TABLET | Freq: Two times a day (BID) | ORAL | 0 refills | Status: AC
  Filled 2024-05-11: qty 26, 13d supply, fill #0

## 2024-05-11 MED ORDER — AMLODIPINE BESYLATE 10 MG PO TABS
10.0000 mg | ORAL_TABLET | Freq: Every day | ORAL | 0 refills | Status: AC
  Filled 2024-05-11: qty 30, 30d supply, fill #0

## 2024-05-11 NOTE — Care Management Note (Signed)
 Wound 04/30/24 1723 Pressure Injury Sacrum Left Stage 2 - Partial thickness loss of dermis presenting as a shallow open injury with a red, pink wound bed without slough.

## 2024-05-11 NOTE — Plan of Care (Signed)
" °  Problem: Consults Goal: RH STROKE PATIENT EDUCATION Description: See Patient Education module for education specifics  Outcome: Progressing Goal: Diabetes Guidelines if Diabetic/Glucose > 140 Description: If diabetic or lab glucose is > 140 mg/dl - Initiate Diabetes/Hyperglycemia Guidelines & Document Interventions  Outcome: Progressing   Problem: RH BOWEL ELIMINATION Goal: RH STG MANAGE BOWEL WITH ASSISTANCE Description: STG Manage Bowel with mod I Assistance. Outcome: Progressing Goal: RH STG MANAGE BOWEL W/MEDICATION W/ASSISTANCE Description: STG Manage Bowel with Medication with mod I Assistance. Outcome: Progressing   Problem: RH BLADDER ELIMINATION Goal: RH STG MANAGE BLADDER WITH ASSISTANCE Description: STG Manage Bladder With toileting Assistance Outcome: Progressing   Problem: RH SAFETY Goal: RH STG ADHERE TO SAFETY PRECAUTIONS W/ASSISTANCE/DEVICE Description: STG Adhere to Safety Precautions With cues Assistance/Device. Outcome: Progressing   Problem: RH KNOWLEDGE DEFICIT Goal: RH STG INCREASE KNOWLEDGE OF DIABETES Description: Patient and dtr will be able to manage DM using educational resources for medications and dietary modification independently Outcome: Progressing Goal: RH STG INCREASE KNOWLEDGE OF HYPERTENSION Description: Patient and dtr will be able to manage HTN using educational resources for medications and dietary modification independently Outcome: Progressing Goal: RH STG INCREASE KNOWLEGDE OF HYPERLIPIDEMIA Description: Patient and dtr will be able to manage HLD using educational resources for medications and dietary modification independently Outcome: Progressing Goal: RH STG INCREASE KNOWLEDGE OF STROKE PROPHYLAXIS Description: Patient and dtr will be able to manage secondary risks using educational resources for medications and dietary modification independently Outcome: Progressing

## 2024-05-11 NOTE — Progress Notes (Signed)
 Physical Therapy Session Note  Patient Details  Name: Hannah [name] MRN: [id number] Date of Birth: 06/13/1965  Today's Date: 05/11/2024 PT Individual Time: 1102-1200 PT Individual Time Calculation (min): 58 min   Short Term Goals: Week 2:  PT Short Term Goal 1 (Week 2): STG=LTG due to LOS  Skilled Therapeutic Interventions/Progress Updates:     Pt seated in [facility] with daughter present upon arrival. Pt denies pain and agreeable to therapy. Session emphasized hands-on family training and education with ambulation and transfers. Pt transported dependent in [facility] to ortho gym for time/energy conservation. Pt performed sit to stand WC to RW with min A and VC for foot placement. Pt amb ~20 ft with CGA and max VC for sequencing and upright standing posture. After prolonged seated rest break due to fagitue, pt's daughter instructed in and practiced providing assit during amb with pt using gait belt and RW. PT educated pt's daughter on current assist level, use of gait belt, and assist to RW as pt tends to use it slightly angled to L. PT provided education on cueing during STS, for hip/knee extension, and sequencing during ambulation. Due to fatigue pt required more min A from daughter. PT provided education on fluctuations in required assist based on fatigue. PT provided education on safety and sequencing for car transfers. Pt practiced car transfer using RW with min A and VC. PT demonstrated during transfer WC to car and pt's daughter practiced assisting pt for car back to [facility]. Once back in room, pt performed squat pivot transfer back to EOB and returned to supine with CGA/++ time. Pt's daughter verbalized and demonstrated understanding of education provided during session. All questions addressed. Pt positioned for comfort, NT in room, daughter at bedside, and all needs in reach at end of session.  Therapy Documentation Precautions:  Precautions Precautions: Fall Precaution/Restrictions Comments: R  hemiparesis Restrictions Weight Bearing Restrictions Per Provider Order: No  Therapy/Group: Individual Therapy  [name], PT, DPT 05/11/2024, 7:24 AM

## 2024-05-11 NOTE — Progress Notes (Signed)
 Occupational Therapy Discharge Summary  Patient Details  Name: Hannah [name] MRN: [id number] Date of Birth: 11-May-1966  Date of Discharge from OT service:May 12, 2024  Patient has met 7 of 11 long term goals due to improved activity tolerance, improved balance, postural control, ability to compensate for deficits, and improved coordination.  Patient to discharge at [facility] Assist level.  Patient's care partner is independent to provide the necessary physical and cognitive assistance at discharge.    Reasons goals not met: dynamic standing/balance and functional transfer goals at supervision level due to baseline hemi deficits including dynamic balance, GMC and cognitive carryover impairments  Recommendation:  Patient will benefit from ongoing skilled OT services in home health setting to continue to advance functional skills in the area of BADL and Reduce care partner burden.  Equipment: Hospital bed  Reasons for discharge: treatment goals met  Patient/family agrees with progress made and goals achieved: Yes  OT Discharge Precautions/Restrictions  Precautions Precautions: Fall Precaution/Restrictions Comments: R hemiparesis Restrictions Weight Bearing Restrictions Per Provider Order: No ADL ADL Eating: Set up Grooming: Minimal assistance Upper Body Bathing: Moderate assistance Lower Body Bathing: Maximal assistance Upper Body Dressing: Maximal assistance Lower Body Dressing: Maximal assistance Toileting: Maximal assistance Toilet Transfer: Moderate assistance Toilet Transfer Method: Stand pivot Vision Baseline Vision/History: 0 No visual deficits Patient Visual Report: Diplopia;Blurring of vision;Peripheral vision impairment Vision Assessment?: Yes;Vision impaired- to be further tested in functional context Additional Comments: Pt reports vision at baseline Perception  Perception: Impaired Praxis Praxis: Impaired Praxis Impairment Details: Organization;Motor  planning;Initiation Cognition Overall Cognitive Status: History of cognitive impairments - at baseline Arousal/Alertness: Awake/alert Orientation Level: Person;Place;Situation Person: Oriented Place: Oriented Situation: Oriented Memory: Impaired Memory Impairment: Retrieval deficit;Storage deficit Attention: Sustained Sustained Attention: Appears intact Awareness: Appears intact Awareness Impairment: Emergent impairment;Anticipatory impairment Problem Solving: Impaired Problem Solving Impairment: Verbal complex;Functional complex Executive Function: Sequencing;Organizing Sequencing: Appears intact Sequencing Impairment: Verbal basic;Functional basic Organizing: Appears intact Organizing Impairment: Verbal basic;Functional basic Safety/Judgment: Appears intact Brief Interview for Mental Status (BIMS) Repetition of Three Words (First Attempt): 3 Temporal Orientation: Year: Correct Temporal Orientation: Month: Accurate within 5 days Temporal Orientation: Day: Correct Recall: Sock: Yes, no cue required Recall: Blue: Yes, no cue required Recall: Bed: Yes, no cue required BIMS Summary Score: 15 Sensation Sensation Light Touch: Appears Intact Hot/Cold: Not tested Proprioception: Impaired by gross assessment Stereognosis: Not tested Coordination Gross Motor Movements are Fluid and Coordinated: No Fine Motor Movements are Fluid and Coordinated: No Coordination and Movement Description: R hemi, previous CVA with L hemi, global weakness/deconditioning, decreased balance/coordination, and poor activity tolerance Finger Nose Finger Test: LUE ataxic, RUE unable Motor  Motor Motor: Hemiplegia Motor - Skilled Clinical Observations: R hemi, previous CVA with L hemi, global weakness/deconditioning, decreased balance/coordination, and poor activity tolerance   Extremity/Trunk Assessment RUE Assessment RUE Assessment: Exceptions to [facility] Passive Range of Motion (PROM) Comments:  limited PROM <90 for shoulder and increased tone in elbow Active Range of Motion (AROM) Comments: severely limited LUE Assessment LUE Assessment: Within Functional Limits   Hope E Kluttz 05/11/2024, 8:02 AM

## 2024-05-11 NOTE — Progress Notes (Signed)
 Inpatient Rehabilitation Discharge Medication Review by a Pharmacist  A complete drug regimen review was completed for this patient to identify any potential clinically significant medication issues.  High Risk Drug Classes Is patient taking? Indication by Medication  Antipsychotic No   Anticoagulant No   Antibiotic No   Opioid No   Antiplatelet Yes Asa/brilinta - cva ppx  Hypoglycemics/insulin  Yes Insulin - T2DM  Vasoactive Medication Yes Norvasc - HTN Coreg - HTN Hydralazine - HTN Imdur - HTN NitroSL- angina  Chemotherapy Yes, Oral Chemotherapy and No   Other Yes Azathioprine - chorioretinitis Aranesp - anemia Pepcid - GERD Melatonin- sleep Crestor - HLD     Type of Medication Issue Identified Description of Issue Recommendation(s)  Drug Interaction(s) (clinically significant)     Duplicate Therapy     Allergy     No Medication Administration End Date     Incorrect Dose     Additional Drug Therapy Needed     Significant med changes from prior encounter (inform family/care partners about these prior to discharge).    Other       Clinically significant medication issues were identified that warrant physician communication and completion of prescribed/recommended actions by midnight of the next day:  No   Time spent performing this drug regimen review (minutes):  30   Ellise Kovack BS, PharmD, BCPS Clinical Pharmacist 05/11/2024 9:08 AM  Contact: [phone] after 3 PM

## 2024-05-11 NOTE — Progress Notes (Signed)
" °   05/11/24 1826  Vitals  Temp 98.4 F (36.9 C)  Pulse Rate 86  Resp 10  BP (!) 196/80  SpO2 99 %  O2 Device Room Air  Weight  (unable to weight)  Type of Weight Post-Dialysis  Oxygen Therapy  Patient Activity (if Appropriate) In bed  Pulse Oximetry Type Continuous  Oximetry Probe Site Changed No  Post Treatment  Dialyzer Clearance Lightly streaked  Liters Processed 84  Fluid Removed (mL) 1000 mL  Tolerated HD Treatment Yes    "

## 2024-05-11 NOTE — Progress Notes (Signed)
 Speech Language Pathology Discharge Summary  Patient Details  Name: Hannah [name] MRN: [id number] Date of Birth: [birth year]  Date of Discharge from SLP service:May 11, 2024  Today's Date: 05/11/2024 SLP Individual Time: 0930-0956 SLP Individual Time Calculation (min): 26 min   Skilled Therapeutic Interventions:  SLP conducted skilled therapy session targeting family education, cognition, and communication goals. Patient's daughter present throughout session and receptive to all information provided re: current status, recommendations for further activities at home to promote ongoing cognitive engagement. Patient was 95% intelligible throughout duration of session (in quiet environment) with supervision. Patient completed mildly complex problem solving/scanning task with supervision and +processing time. Patient was left in room with call bell in reach and alarm set. Patient is at goal level and appropriate for discharge from SLP caseload. See full discharge summary below.  Patient has met 3 of 3 long term goals.  Patient to discharge at overall Supervision;Min level.  Reasons goals not met: n/a   Clinical Impression/Discharge Summary:   Patient has made excellent progress towards therapy goals during SLP caseload admission, meeting 3/3 long term goals set for duration of admission. Patient currently benefits from supervision assist for basic problem solving and min assist for recall and use of speech intelligibility strategies at the sentence level across a variety of contexts. Patient with baseline deficits from prior stroke and is near baseline level for cognitive function and use of communication strategies. Patient may benefit from ongoing SLP services to target aforementioned deficits from prior stroke if desired, though family has functional care routine established and may opt to forgo further SLP services. Patient and family education complete. SLP will sign off.  Care Partner:   Caregiver Able to Provide Assistance: Yes  Type of Caregiver Assistance: Cognitive  Recommendation:  24 hour supervision/assistance;None      Equipment: n/a   Reasons for discharge: Discharged from hospital   Patient/Family Agrees with Progress Made and Goals Achieved: Yes   Ralphael Southgate, M.A., CCC-SLP  Yosef Krogh A Yousaf Sainato 05/11/2024, 10:26 AM

## 2024-05-11 NOTE — Progress Notes (Signed)
" °  [facility] Progress Note   Subjective:   Seen in room. No new events. For dialysis today.   Objective Vitals:   05/10/24 1426 05/10/24 1443 05/10/24 1930 05/11/24 0503  BP: 138/61 (!) 139/59 (!) 144/56 139/66  Pulse: 79 84 91 89  Resp: 20  16 17   Temp: 98.8 F (37.1 C)  98.7 F (37.1 C) 97.8 F (36.6 C)  TempSrc: Oral  Oral   SpO2: 100% 99% 98% 100%  Weight:    46.3 kg  Height:       Physical Exam General: Thin but well appearing, NAD. Room air Heart: RRR; no murmur Lungs: Resp unlabored  Abdomen: soft Extremities: no LE edema  Dialysis Access: R AVG +bruit  Additional Objective Labs: Basic Metabolic Panel: Recent Labs  Lab 05/06/24 0630 05/08/24 1353  NA 136 134*  K 4.8 4.2  CL 98 97*  CO2 29 27  GLUCOSE 73 156*  BUN 36* 35*  CREATININE 5.67* 5.16*  CALCIUM  9.3 9.0  PHOS 4.0 3.6   Liver Function Tests: Recent Labs  Lab 05/06/24 0630 05/08/24 1353  ALBUMIN 3.6 3.6   CBC: Recent Labs  Lab 05/06/24 0630 05/08/24 1353  WBC 4.9 4.0  NEUTROABS 3.6  --   HGB 8.5* 8.1*  HCT 26.1* 25.1*  MCV 96.7 96.5  PLT 137* 93*   Medications:   amLODipine   10 mg Oral Daily   aspirin  EC  81 mg Oral Daily   azaTHIOprine   50 mg Oral Daily   B-complex with vitamin C  1 tablet Oral Daily   calcium  acetate  667 mg Oral TID WC   carvedilol   25 mg Oral BID WC   Chlorhexidine  Gluconate Cloth  6 each Topical Q0600   cholecalciferol   2,000 Units Oral Daily   darbepoetin (ARANESP ) injection - DIALYSIS  40 mcg Subcutaneous Q Fri-1800   docusate sodium   100 mg Oral Daily   famotidine   20 mg Oral QHS   hydrALAZINE   50 mg Oral TID   insulin  glargine  10 Units Subcutaneous Daily   isosorbide  mononitrate  30 mg Oral QHS   l-methylfolate-B6-B12  1 tablet Oral Daily   lidocaine   1 patch Transdermal Q24H   lidocaine   1 patch Transdermal Q24H   polyethylene glycol  17 g Oral BID   rosuvastatin   20 mg Oral Daily   senna  1 tablet Oral QHS   ticagrelor   90  mg Oral BID    Dialysis Orders AF MWF  3:30hr, BFR 350, EDW 45.4kg, 2K/2Ca AVG 16g No heparin   -No ESA  -Hectorol  1 mcg q HD   Assessment/Plan: Acute CVA. H/o prior strokes. 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 Management per neuro. In CIR ESRD: Usual MWF schedule - next HD Friday (back to usual). Prolonged bleeding from AVG: SQ heparin  held with resolution.  Continue to monitor. Consider a F'gram if bleeding recurs. Hypertension/volume: BP to goal, no edema. Anemia of ESRD: Hgb down, will begin Aranesp  40mcg weekly on 12/26. Metabolic bone disease: Ca/Phos ok - continue home VDRA + binders.  T2DM: On insulin , per primary. 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 Dispo: Per notes, plan for discharge on 12/28. Per outpatient clinic is not running holiday schedule next week so should be ok.   [name] PA-C [facility] 05/11/2024,8:34 AM     "

## 2024-05-11 NOTE — Progress Notes (Signed)
 "                                                        PROGRESS NOTE   Subjective/Complaints: Discussed CBGs up to low 300s, discussed that she had a big holiday meal yesterday, prefers not to increase her insulin  further due to hypoglycemia in the past    05/11/2024    5:03 AM 05/10/2024    7:30 PM 05/10/2024    2:43 PM  Vitals with BMI  Weight 102 lbs    BMI 18.42    Systolic 139 144 860  Diastolic 66 56 59  Pulse 89 91 84    Recent Labs    05/10/24 1638 05/10/24 2119 05/11/24 0602  GLUCAP 235* 312* 192*   ROS: Positives per HPI above. Denies fevers, chills, SOB, chest pain, new weakness or paraesthesias.   Nausea, constipation is improved, +insomnia, denies pain   Objective:   No results found. Recent Labs    05/08/24 1353  WBC 4.0  HGB 8.1*  HCT 25.1*  PLT 93*   Recent Labs    05/08/24 1353  NA 134*  K 4.2  CL 97*  CO2 27  GLUCOSE 156*  BUN 35*  CREATININE 5.16*  CALCIUM  9.0     Intake/Output Summary (Last 24 hours) at 05/11/2024 0931 Last data filed at 05/11/2024 0900 Gross per 24 hour  Intake 238 ml  Output --  Net 238 ml         Physical Exam: Vital Signs Blood pressure 139/66, pulse 89, temperature 97.8 F (36.6 C), resp. rate 17, height 5' 2.4 (1.585 m), weight 46.3 kg, SpO2 100%.  Gen: no distress, normal appearing. Sitting up in chair HEENT: oral mucosa pink and moist, NCAT Cardio: Reg rate and rhythm. No m/r/g.  Chest: normal effort, normal rate of breathing. CTAB.  Abd: soft, non-distended, +BS hypoactive; TTP bilaterally diffusely Ext: no edema.  Psych: pleasant, normal affect, bright and smiling always Skin: foam dressing on sacrum Neuro: +generalized muscle wasting, +ataxic speech- improving, 4-/5 strength on the right side, otherwise strength is 5/5   Physical exam unchanged from the above on reexamination 05/11/2024    Assessment/Plan: 1. Functional deficits which require 3+ hours per day of interdisciplinary  therapy in a comprehensive inpatient rehab setting. Physiatrist is providing close team supervision and 24 hour management of active medical problems listed below. Physiatrist and rehab team continue to assess barriers to discharge/monitor patient progress toward functional and medical goals  Care Tool:  Bathing    Body parts bathed by patient: Right arm, Chest, Abdomen, Right upper leg, Left upper leg, Face   Body parts bathed by helper: Left arm, Buttocks, Front perineal area, Right lower leg, Left lower leg     Bathing assist Assist Level: Maximal Assistance - Patient 24 - 49%     Upper Body Dressing/Undressing Upper body dressing   What is the patient wearing?: Hospital gown only    Upper body assist Assist Level: Maximal Assistance - Patient 25 - 49%    Lower Body Dressing/Undressing Lower body dressing      What is the patient wearing?: Underwear/pull up     Lower body assist Assist for lower body dressing: Total Assistance - Patient < 25%     Toileting Toileting    Toileting assist Assist  for toileting: Maximal Assistance - Patient 25 - 49%     Transfers Chair/bed transfer  Transfers assist     Chair/bed transfer assist level: Minimal Assistance - Patient > 75%     Locomotion Ambulation   Ambulation assist      Assist level: Contact Guard/Touching assist Assistive device: Walker-rolling Max distance: 73ft   Walk 10 feet activity   Assist  Walk 10 feet activity did not occur: Safety/medical concerns (fatigue, weakness/deconditioning)  Assist level: Contact Guard/Touching assist Assistive device: Walker-rolling   Walk 50 feet activity   Assist Walk 50 feet with 2 turns activity did not occur: Safety/medical concerns (fatigue, weakness/deconditioning)  Assist level: Moderate Assistance - Patient - 50 - 74% Assistive device: Walker-Eva    Walk 150 feet activity   Assist Walk 150 feet activity did not occur: Safety/medical concerns          Walk 10 feet on uneven surface  activity   Assist Walk 10 feet on uneven surfaces activity did not occur: Safety/medical concerns (fatigue, weakness/deconditioning)         Wheelchair     Assist Is the patient using a wheelchair?: Yes Type of Wheelchair: Manual    Wheelchair assist level: Supervision/Verbal cueing Max wheelchair distance: 113ft    Wheelchair 50 feet with 2 turns activity    Assist        Assist Level: Supervision/Verbal cueing   Wheelchair 150 feet activity     Assist      Assist Level: Supervision/Verbal cueing   Blood pressure 139/66, pulse 89, temperature 97.8 F (36.6 C), resp. rate 17, height 5' 2.4 (1.585 m), weight 46.3 kg, SpO2 100%.  Medical Problem List and Plan: 1. Functional deficits secondary to RIght dorsal midbrain infarct             -patient may  shower             -ELOS/Goals: 13 days, S sup/minA--estimated discharge 12-28  Chart and therapy notes reviewed, continue CIR, discussed patient's progress with therapy Grounds pass ordered Vitamin D3/Metanx/Vitamin B/C complex ordered F/u with me or Fidela in clinic within 1 month Would benefit from home health aide upon discharge  This patient is capable of making decisions on her own behalf. Updated daughter  2.  Anemia: Heparin  d/ced due to bleeding from fistula site, Hgb reviewed and is now stable, stool occult reviewed and is negative             -antiplatelet therapy: continue ASA/Ticagrelor   3. H/o Sacroiliitis: Tylenol  prn mild pain. D/c norco, lidocaine  patch ordered, kpad ordered  4. Insomnia: scheduled melatonin 5mg  HS   5. T2DM:  Hgb A1c-  8.5. Monitor BS ac/hs             --was on insulin  glargine 12 units w/ 5 units novolog  TID. Increase glargine to 11U but patient refuses this dose and wants 10U due to past experiences with hypoglycemia- changed back             --will continue to monitor BS for trend and titrate insulin  as needed.   -discussed  that CBGs were elevated yesterday due to holiday meal  D/c ISS Recent Labs    05/10/24 1638 05/10/24 2119 05/11/24 0602  GLUCAP 235* 312* 192*      6. ESRD: HD MWF at the end of the day to help with tolerance of therapy.              --Phoslo  tid ac for  metabolic bone disease.   continue fluid restriction--labs stable  7. Hypertension: increase amlodipine  to 10mg  daily, continue coreg , hydralazine  and imdur .  8. H/o Chorioretinitis: continue azithropine daily. .   9. Constipation: LBM 12/22, senna ordered HS, messaged nursing to confirm accuract of last BM  10. GERD: continue pepcid   LOS: 11 days A FACE TO FACE EVALUATION WAS PERFORMED  Hannah [name] Hannah [name] 05/11/2024, 9:31 AM     "

## 2024-05-11 NOTE — Progress Notes (Signed)
 Occupational Therapy Note  Patient Details  Name: Hannah [name] MRN: [id number] Date of Birth: [birth year]  The following the equipment is recommended by occupational therapy to increase pt's ability to perform ADL and decrease burden of care:  [facility] bed with full railing as pt has sacral pressure regions of concern, requires assist for toileting and ADLs at bed level, and requires min/mod A for functional mobility    Lorrayne FORBES Fritter, MS, OTR/L  05/11/2024, 9:46 AM

## 2024-05-11 NOTE — Progress Notes (Signed)
 Pt. Came in awake and oriented with no complaints. Pt. Refused to be on the recliner as per the floor nurse. Consent signed and on file Started with no complaints  UF goal: 3L Tx duration: 3.5 hours  Access used: Right CVC Access issue: None  Tx completed and tolerated. Catheter dwelled and locked with heparin  Endorsed to floor nurse Transported to room  Hannah Morley Corion Sherrod,RN [facility]

## 2024-05-11 NOTE — Progress Notes (Signed)
 Occupational Therapy Session Note  Patient Details  Name: Hannah [name] MRN: [id number] Date of Birth: [birth year]  Today's Date: 05/11/2024 OT Individual Time: 9194-9154 & 1005-1100 OT Individual Time Calculation (min): 40 min & 55 min   Short Term Goals: Week 2:  OT Short Term Goal 1 (Week 2): STG = LTG due to ELOS  Skilled Therapeutic Interventions/Progress Updates:  Session 1 Skilled OT intervention completed with focus on family education and ADL retraining. Pt received upright in bed, agreeable to session. No pain reported. Daughter present for education.  Daughter agreeable to demonstrate their routine for getting pt dressed and ready for day. OT offered suggestions for decreasing caregiver burden and increasing pt independence, including the following: -allowing pt to initiate and complete tasks as much on her own first, then stepping in and helping on R side especially due to baseline hemiplegia of RUE.  -cueing pt prior to mobility or tasks due to impaired initiation and delayed processing -caregiver body mechanics for safety  Pt doffed brief with min A for over feet, set up A for peri hygiene and min A for brief all at bed level. Pt required assist on R side but able to bridge efficiently with cueing. Pt attempted to pull on daughter for bed mobility, but with OT demo, pt able to transition > R side lying and push to sit with one step commands with overall min/mod A. OT demo min A sit > stand with pt using RW with cues for hand placement and anterior weight shifting. Discussed caregiver body mechanics on being on lateral or posterior position relative to pt for safety vs in front and pulling pt to stand like baseline. Daughter was able to return demo with pt, min A sit > stand with RW, then ambulated pt with min A using RW and OT providing CGA for safety > w/c about 5 ft. Cueing needed for pt positioning especially during turning for hemi side, as well as instruction to daughter her  positioning, I.e. being to side or behind pt as she turns vs getting in front. Daughter adequately cued pt to reach back to chair prior to sitting.  Pt remained seated in w/c, with daughter in room and with all needs in reach at end of session.  Session 2 Skilled OT intervention completed with focus on caregiver education with daughter regarding shower routine. Pt received seated in w/c, agreeable to session. No pain reported.  Discussed plans for toileting- pt preference for use of toilet, but is incontinent of urine, so plan for brief use and then [facility] over toilet when needed for BM vs with bed pan like she reports having done here. Pt with noted phobia of toilet in ADL apartment and in general, requiring OT to cover toilet with a sheet for pt cooperation.  Daughter reports bathroom set up as tub/shower with curtain. They have TTB already, but did discuss potential purchase of padded bench for pt comfort due to frailty as well as efficiency of pericare. Demonstrated method of tucking shower curtain under buttocks to prevent water spillage in floor. Discussed using lateral leans for peri-washing. Daughter assisted pt with CGA sit > stand RW, and CGA/min A ambulatory transfer with RW > TTB with OT providing CGA for safety and cueing to both. Pt required min A for RLE <> tub. On min A ambulatory transfer > w/c, daughter required cueing for body positioning to stay behind or side of pt when in tight spaces and pt is turning vs getting in front  of pt. Further discussed energy conservation strategies, plans for dressing, and ways to minimize fall risk.  Pt remained seated in w/c, with daughter present and with all needs in reach at end of session.   Therapy Documentation Precautions:  Precautions Precautions: Fall Precaution/Restrictions Comments: R hemiparesis Restrictions Weight Bearing Restrictions Per Provider Order: No    Therapy/Group: Individual Therapy  Lorrayne FORBES Fritter, MS,  OTR/L  05/11/2024, 12:10 PM

## 2024-05-11 NOTE — Plan of Care (Signed)
°  Problem: RH Expression Communication °Goal: LTG Patient will increase speech intelligibility (SLP) °Description: LTG: Patient will increase speech intelligibility at word/phrase/conversation level with cues, % of the time (SLP) °Outcome: Completed/Met °  °Problem: RH Problem Solving °Goal: LTG Patient will demonstrate problem solving for (SLP) °Description: LTG:  Patient will demonstrate problem solving for basic/complex daily situations with cues  (SLP) °Outcome: Completed/Met °  °Problem: RH Memory °Goal: LTG Patient will use memory compensatory aids to (SLP) °Description: LTG:  Patient will use memory compensatory aids to recall biographical/new, daily complex information with cues (SLP) °Outcome: Completed/Met °  °

## 2024-05-11 NOTE — Progress Notes (Addendum)
 Patient ID: Hannah [name], female   DOB: Oct 09, 1965, 58 y.o.   MRN: [id number]  Hospital bed with air loss mattress ordered via [employer].   Transportation home set up with [facility] for 12/28 at 10am. Discharge packet left at the 4W nurses station with secretary.

## 2024-05-12 ENCOUNTER — Other Ambulatory Visit (HOSPITAL_COMMUNITY): Payer: Self-pay

## 2024-05-12 DIAGNOSIS — N186 End stage renal disease: Secondary | ICD-10-CM

## 2024-05-12 DIAGNOSIS — R739 Hyperglycemia, unspecified: Secondary | ICD-10-CM

## 2024-05-12 DIAGNOSIS — Z992 Dependence on renal dialysis: Secondary | ICD-10-CM

## 2024-05-12 DIAGNOSIS — I10 Essential (primary) hypertension: Secondary | ICD-10-CM

## 2024-05-12 LAB — GLUCOSE, CAPILLARY
Glucose-Capillary: 112 mg/dL — ABNORMAL HIGH (ref 70–99)
Glucose-Capillary: 82 mg/dL (ref 70–99)
Glucose-Capillary: 152 mg/dL — ABNORMAL HIGH (ref 70–99)
Glucose-Capillary: 198 mg/dL — ABNORMAL HIGH (ref 70–99)

## 2024-05-12 MED ORDER — GERHARDT'S BUTT CREAM
TOPICAL_CREAM | Freq: Four times a day (QID) | CUTANEOUS | Status: DC | PRN
  Filled 2024-05-12: qty 60

## 2024-05-12 NOTE — Progress Notes (Signed)
 Physical Therapy Session Note  Patient Details  Name: Hannah [name] MRN: [id number] Date of Birth: [birth year]  Today's Date: 05/12/2024 PT Individual Time: 0900-0945 PT Individual Time Calculation (min): 45 min   Today's Date: 05/12/2024 PT Individual Time: 1300-1357 PT Individual Time Calculation (min): 57 min   Short Term Goals: Week 2:  PT Short Term Goal 1 (Week 2): STG=LTG due to LOS  Skilled Therapeutic Interventions/Progress Updates:     Treatment 1: Pt semi-reclined in bed with daughter at bedside upon arrival. Pt finishing up eating pineapples. Pt denies pain and agreeable to therapy. Pt's daughter present for entire session and asked if we could go through a seated HEP for her mom. PT threaded pants B LE and required min A for B LE to assume bridge position with cues for increased R hip extension in order to pull pants over hips with mod A. Pt sat to EOB with with HOB elevated and use of hospital bed features with supervision/VC/++ time. PT donned socks and shoes while sitting EOB dependent for time management. Performed stand pivot into WC with RW and min A and max VC to sequencing and improved hip/knee extension in standing. Pt transported dependent in [facility] to day room for time/energy management. Pt instructed in and performed 1x10 for each of the following B LE strengthening exercises:  - Seated Heel Toe Raises - Seated Long Arc Quad - Seated Hip Adduction Squeeze with Ball - Seated Hip Abduction with Resistance (RTB) - Seated March  PT provided pt with illustrated handout for HEP. Pt and pt's daughter verbalized understanding. Pt remained seated in [facility], daughter at bedside, and all needs in reach at end of session.  Treatment 2: Pt seated in [facility] with daughter at bedside upon arrival. Pt denies pain but endorses fatigue and agreeable to therapy for hands-on transfer training with daughter. Session emphasized hands-on transfer training and education as well as WC mobility. Pt  self-propelled WC using L UE and L LE ~110 ft to day room with supervision and VC to avoid obstacles. PT provided explanation and demonstration for technique, sequencing, and cueing with squat pivot transfers and stand pivot transfers using RW. Pt performed each transfer type twice, once with PT and once with daughter. Pt very fatigued afterwards. Pt and daughter verbalized comfort with transfers. PT recommended they use squat pivots when pt is more fatigued, for example on dialysis days and using stand pivot when less fatigued. PT also educated pt and daughter on transferring to stronger side if able. Both verbalized understanding. Pt stated she was too fatigued to practice any further but agreed to reviewing HEP exercises from earlier. Pt performed 1x10 B LE heel/toe raises, LAQ, and marching while seated in WC. Pt remained seated in [facility], daughter at bedside, and all needs in reach at end of session.  Therapy Documentation Precautions:  Precautions Precautions: Fall Precaution/Restrictions Comments: R hemiparesis Restrictions Weight Bearing Restrictions Per Provider Order: No  Therapy/Group: Individual Therapy  [name], PT, DPT 05/12/2024, 7:28 AM

## 2024-05-12 NOTE — Progress Notes (Signed)
 "                                                        PROGRESS NOTE   Subjective/Complaints:  Pt doing well, slept poorly though. Pain well managed. LBM last night and again this morning. Urinating per her usual, dialysis yesterday went fine. No other complaints or concerns.    ROS: as per HPI. Denies CP, SOB, abd pain, N/V/D/C, or any other complaints at this time.    +insomnia   Objective:   No results found. No results for input(s): WBC, HGB, HCT, PLT in the last 72 hours.  No results for input(s): NA, K, CL, CO2, GLUCOSE, BUN, CREATININE, CALCIUM  in the last 72 hours.    Intake/Output Summary (Last 24 hours) at 05/12/2024 1241 Last data filed at 05/11/2024 2117 Gross per 24 hour  Intake 236 ml  Output 1000 ml  Net -764 ml         Physical Exam: Vital Signs Blood pressure 130/62, pulse 84, temperature 98.4 F (36.9 C), resp. rate 16, height 5' 2.4 (1.585 m), weight 43.5 kg, SpO2 99%.  Gen: no distress, normal appearing. Sitting up in w/c with PT HEENT: oral mucosa pink and moist, NCAT Cardio: Reg rate and rhythm. No m/r/g.  Chest: normal effort, normal rate of breathing. CTAB.  Abd: soft, non-distended, +BS normoactive, nonTTP Ext: no edema.  Psych: pleasant, normal affect  PRIOR EXAMS: Skin: foam dressing on sacrum Neuro: +generalized muscle wasting, +ataxic speech- improving, 4-/5 strength on the right side, otherwise strength is 5/5      Assessment/Plan: 1. Functional deficits which require 3+ hours per day of interdisciplinary therapy in a comprehensive inpatient rehab setting. Physiatrist is providing close team supervision and 24 hour management of active medical problems listed below. Physiatrist and rehab team continue to assess barriers to discharge/monitor patient progress toward functional and medical goals  Care Tool:  Bathing    Body parts bathed by patient: Right arm, Chest, Abdomen, Right upper leg, Left upper  leg, Face, Front perineal area, Buttocks, Left lower leg   Body parts bathed by helper: Right lower leg, Left arm     Bathing assist Assist Level: Minimal Assistance - Patient > 75%     Upper Body Dressing/Undressing Upper body dressing   What is the patient wearing?: Pull over shirt    Upper body assist Assist Level: Supervision/Verbal cueing    Lower Body Dressing/Undressing Lower body dressing      What is the patient wearing?: Incontinence brief, Pants     Lower body assist Assist for lower body dressing: Minimal Assistance - Patient > 75%     Toileting Toileting    Toileting assist Assist for toileting: Minimal Assistance - Patient > 75%     Transfers Chair/bed transfer  Transfers assist     Chair/bed transfer assist level: Minimal Assistance - Patient > 75%     Locomotion Ambulation   Ambulation assist      Assist level: Contact Guard/Touching assist Assistive device: Walker-rolling Max distance: 86ft   Walk 10 feet activity   Assist  Walk 10 feet activity did not occur: Safety/medical concerns (fatigue, weakness/deconditioning)  Assist level: Contact Guard/Touching assist Assistive device: Walker-rolling   Walk 50 feet activity   Assist Walk 50 feet with 2 turns activity did not  occur: Safety/medical concerns (fatigue, weakness/deconditioning)  Assist level: Moderate Assistance - Patient - 50 - 74% Assistive device: Walker-Eva    Walk 150 feet activity   Assist Walk 150 feet activity did not occur: Safety/medical concerns         Walk 10 feet on uneven surface  activity   Assist Walk 10 feet on uneven surfaces activity did not occur: Safety/medical concerns (fatigue, weakness/deconditioning)         Wheelchair     Assist Is the patient using a wheelchair?: Yes Type of Wheelchair: Manual    Wheelchair assist level: Supervision/Verbal cueing Max wheelchair distance: 160ft    Wheelchair 50 feet with 2 turns  activity    Assist        Assist Level: Supervision/Verbal cueing   Wheelchair 150 feet activity     Assist      Assist Level: Supervision/Verbal cueing   Blood pressure 130/62, pulse 84, temperature 98.4 F (36.9 C), resp. rate 16, height 5' 2.4 (1.585 m), weight 43.5 kg, SpO2 99%.  Medical Problem List and Plan: 1. Functional deficits secondary to RIght dorsal midbrain infarct             -patient may  shower             -ELOS/Goals: 13 days, S sup/minA--estimated discharge 12-28  Chart and therapy notes reviewed, continue CIR, discussed patient's progress with therapy Grounds pass ordered Vitamin D3/Metanx/Vitamin B/C complex ordered F/u with me or Fidela in clinic within 1 month Would benefit from home health aide upon discharge  This patient is capable of making decisions on her own behalf. Updated daughter  2.  Anemia: Heparin  d/ced due to bleeding from fistula site, Hgb reviewed and is now stable, stool occult reviewed and is negative             -antiplatelet therapy: continue ASA/Ticagrelor   3. H/o Sacroiliitis: Tylenol  prn mild pain. D/c norco, lidocaine  patch ordered, kpad ordered  4. Insomnia: scheduled melatonin 5mg  HS   5. T2DM:  Hgb A1c-  8.5. Monitor BS ac/hs             --was on insulin  glargine 12 units w/ 5 units novolog  TID. Increase glargine to 11U but patient refuses this dose and wants 10U due to past experiences with hypoglycemia- changed back             --will continue to monitor BS for trend and titrate insulin  as needed.   -discussed that CBGs were elevated yesterday due to holiday meal  D/c ISS -05/12/24 CBG high again 300 around 9pm, suspect could be food related-- otherwise CBGs fine. Monitor.  Recent Labs    05/11/24 2115 05/12/24 0551 05/12/24 1143  GLUCAP 300* 112* 82      6. ESRD: HD MWF at the end of the day to help with tolerance of therapy.              --Phoslo  tid ac for metabolic bone disease.   continue fluid  restriction--labs stable  7. Hypertension: increase amlodipine  to 10mg  daily, continue coreg , hydralazine  and imdur .  -05/12/24 BP variable but mostly fine, dialysis yesterday should help.  Vitals:   05/11/24 0503 05/11/24 1414 05/11/24 1430 05/11/24 1500  BP: 139/66 (!) 149/64 (!) 157/68 (!) 169/66   05/11/24 1530 05/11/24 1600 05/11/24 1630 05/11/24 1700  BP: (!) 182/72 (!) 168/102 (!) 188/63 (!) 178/65   05/11/24 1730 05/11/24 1826 05/11/24 1921 05/12/24 0550  BP: (!) 157/60 (!) 196/80 ROLLEN)  158/66 130/62    8. H/o Chorioretinitis: continue azithropine daily. .   9. Constipation: LBM 12/22, senna ordered HS, messaged nursing to confirm accuract of last BM. LBM 12/27  10. GERD: continue pepcid   LOS: 12 days A FACE TO FACE EVALUATION WAS PERFORMED  [address] 05/12/2024, 12:41 PM     "

## 2024-05-12 NOTE — Progress Notes (Signed)
 Occupational Therapy Session Note  Patient Details  Name: Hannah [name] MRN: [id number] Date of Birth: [birth year]  Today's Date: 05/12/2024 OT Individual Time: 1430-1435 OT Individual Time Calculation (min): 5 min    Skilled Therapeutic Interventions/Progress Updates:    Patient refusing pm session.  Daughter in room and reports she had an opportunity to practice transfers with PT.  Daughter is comfortable with level of care for patient.  Patient stating I am so tired, it is starting to make me feel sick.  Sent secure message to nursing.    Therapy Documentation Precautions:  Precautions Precautions: Fall Recall of Precautions/Restrictions: Intact Precaution/Restrictions Comments: R hemiparesis Restrictions Weight Bearing Restrictions Per Provider Order: No General: General OT Amount of Missed Time: 55 Minutes Vital Signs: Therapy Vitals Temp: 98.8 F (37.1 C) Temp Source: Oral Pulse Rate: 81 Resp: 18 BP: 139/60 Patient Position (if appropriate): Lying Oxygen Therapy SpO2: 100 % O2 Device: Room Air Pain: Denies pain   Therapy/Group: Individual Therapy  Tacuma Graffam M 05/12/2024, 2:45 PM

## 2024-05-12 NOTE — Progress Notes (Signed)
 Physical Therapy Discharge Summary  Patient Details  Name: Hannah [name] MRN: [id number] Date of Birth: 12/12/1965  Date of Discharge from PT service:May 12, 2024   Patient has met 7 of 9 long term goals due to improved activity tolerance, improved balance, improved postural control, increased strength, decreased pain, ability to compensate for deficits, improved attention, improved awareness, and improved coordination.  Patient to discharge at a wheelchair level Supervision.   Patient's care partner is independent to provide the necessary physical and cognitive assistance at discharge.  Reasons goals not met: Sit <> stand and bed <> chair transfer goals not met due to weakness and fatigue.  Recommendation:  Patient will benefit from ongoing skilled PT services in home health setting to continue to advance safe functional mobility, address ongoing impairments in strength, endurance, sitting/standing balance, NMR, coordination, transfers, ambulation, and minimize fall risk.  Equipment: No equipment provided  Reasons for discharge: treatment goals met and discharge from hospital  Patient/family agrees with progress made and goals achieved: Yes  PT Discharge Precautions/Restrictions Precautions Precautions: Fall Recall of Precautions/Restrictions: Intact Precaution/Restrictions Comments: R hemiparesis Restrictions Weight Bearing Restrictions Per Provider Order: No Pain Interference Pain Interference Pain Effect on Sleep: 2. Occasionally Pain Interference with Therapy Activities: 1. Rarely or not at all Pain Interference with Day-to-Day Activities: 1. Rarely or not at all Vision/Perception  Vision - History Ability to See in Adequate Light: 1 Impaired Perception Perception: Impaired Praxis Praxis: Impaired  Cognition Overall Cognitive Status: History of cognitive impairments - at baseline Arousal/Alertness: Awake/alert Sustained Attention: Appears intact Memory:  Impaired Awareness: Appears intact Problem Solving: Impaired Safety/Judgment: Appears intact Sensation Sensation Light Touch: Appears Intact Proprioception: Impaired by gross assessment Coordination Gross Motor Movements are Fluid and Coordinated: No Coordination and Movement Description: R hemi, previous CVA with L hemi, global weakness/deconditioning, decreased balance/coordination, and poor activity tolerance Motor  Motor Motor: Hemiplegia Motor - Discharge Observations: R hemi, previous CVA with L hemi, global weakness/deconditioning, decreased balance/coordination, and poor activity tolerance  Mobility Bed Mobility Bed Mobility: Rolling Right;Rolling Left;Supine to Sit;Sit to Supine Rolling Right: Supervision/verbal cueing Rolling Left: Supervision/Verbal cueing Supine to Sit: Supervision/Verbal cueing Sit to Supine: Supervision/Verbal cueing Transfers Transfers: Sit to Stand;Stand to Sit;Stand Pivot Transfers;Squat Pivot Transfers Sit to Stand: Minimal Assistance - Patient > 75% Stand to Sit: Minimal Assistance - Patient > 75% Stand Pivot Transfers: Minimal Assistance - Patient > 75% Stand Pivot Transfer Details: Verbal cues for sequencing;Verbal cues for safe use of DME/AE;Verbal cues for technique;Verbal cues for precautions/safety;Verbal cues for gait pattern Squat Pivot Transfers: Minimal Assistance - Patient > 75% Transfer (Assistive device): Rolling walker Locomotion  Gait Ambulation: Yes Gait Assistance: Contact Guard/Touching assist Gait Distance (Feet): 32 Feet Assistive device: Rolling walker Gait Assistance Details: Verbal cues for gait pattern;Verbal cues for safe use of DME/AE;Verbal cues for sequencing;Verbal cues for technique;Verbal cues for precautions/safety Gait Gait: Yes Gait Pattern: Impaired Gait Pattern: Step-to pattern;Decreased step length - right;Decreased step length - left;Decreased stride length;Poor foot clearance - left;Poor foot clearance  - right;Decreased trunk rotation;Trunk flexed Gait velocity: decreased Stairs / Additional Locomotion Stairs: Yes Stairs Assistance: 2 Helpers Stair Management Technique: Two rails Number of Stairs: 4 Pick up small object from the floor (from standing position) activity did not occur: Safety/medical concerns (fatigue/weakness/deconditioning) [employer] Mobility: Yes Wheelchair Assistance: Supervision/Verbal cueing Wheelchair Propulsion: Left lower extremity;Left upper extremity Wheelchair Parts Management: Needs assistance Distance: 182 ft  Trunk/Postural Assessment  Cervical Assessment Cervical Assessment: Exceptions to [facility] (forward head) Thoracic Assessment  Thoracic Assessment: Exceptions to [facility]: Lambert Rhonda W (kyphosis) Lumbar Assessment Lumbar Assessment: Exceptions to [facility] (posterior pelvic tilt) Postural Control Postural Control: Deficits on evaluation Trunk Control: posterior bias Righting Reactions: delayed and inadequate Protective Responses: delayed and inadequate  Balance Balance Balance Assessed: Yes Static Sitting Balance Static Sitting - Balance Support: Left upper extremity supported;Feet supported Static Sitting - Level of Assistance: 5: Stand by assistance Dynamic Sitting Balance Dynamic Sitting - Balance Support: Feet supported;No upper extremity supported Dynamic Sitting - Level of Assistance: 5: Stand by assistance Static Standing Balance Static Standing - Balance Support: Bilateral upper extremity supported Static Standing - Level of Assistance: 4: Min assist (CGA) Dynamic Standing Balance Dynamic Standing - Balance Support: Bilateral upper extremity supported Dynamic Standing - Level of Assistance: 4: Min assist Extremity Assessment  RLE Assessment RLE Assessment: Exceptions to [facility] General Strength Comments: tested sitting in WC RLE Strength Right Hip Flexion: 4-/5 Right Hip ABduction: 4-/5 Right Hip ADduction: 4-/5 Right Knee Flexion:  4-/5 Right Knee Extension: 4-/5 Right Ankle Dorsiflexion: 3+/5 Right Ankle Plantar Flexion: 4-/5 LLE Assessment LLE Assessment: Exceptions to [facility] General Strength Comments: tested sitting in WC LLE Strength Left Hip Flexion: 4-/5 Left Hip ABduction: 4-/5 Left Hip ADduction: 4-/5 Left Knee Flexion: 4-/5 Left Knee Extension: 4-/5 Left Ankle Dorsiflexion: 3+/5 Left Ankle Plantar Flexion: 4-/5   [name], PT, DPT 05/12/2024, 4:17 PM

## 2024-05-12 NOTE — Plan of Care (Signed)
" °  Problem: Sit to Stand Goal: LTG:  Patient will perform sit to stand with assistance level (PT) Description: LTG:  Patient will perform sit to stand with assistance level (PT) Outcome: Not Met (add Reason) Flowsheets (Taken 05/12/2024 1622) LTG: PT will perform sit to stand in preparation for functional mobility with assistance level: (fatigue/weakness) --   Problem: RH Bed to Chair Transfers Goal: LTG Patient will perform bed/chair transfers w/assist (PT) Description: LTG: Patient will perform bed to chair transfers with assistance (PT). Outcome: Not Met (add Reason) Flowsheets (Taken 05/12/2024 1622) LTG: Pt will perform Bed to Chair Transfers with assistance level: (fatigue/weakness) --   Problem: RH Balance Goal: LTG Patient will maintain dynamic standing balance (PT) Description: LTG:  Patient will maintain dynamic standing balance with assistance during mobility activities (PT) Outcome: Completed/Met   Problem: RH Bed Mobility Goal: LTG Patient will perform bed mobility with assist (PT) Description: LTG: Patient will perform bed mobility with assistance, with/without cues (PT). Outcome: Completed/Met   Problem: RH Car Transfers Goal: LTG Patient will perform car transfers with assist (PT) Description: LTG: Patient will perform car transfers with assistance (PT). Outcome: Completed/Met   Problem: RH Ambulation Goal: LTG Patient will ambulate in controlled environment (PT) Description: LTG: Patient will ambulate in a controlled environment, # of feet with assistance (PT). Outcome: Completed/Met Goal: LTG Patient will ambulate in home environment (PT) Description: LTG: Patient will ambulate in home environment, # of feet with assistance (PT). Outcome: Completed/Met   Problem: RH Wheelchair Mobility Goal: LTG Patient will propel w/c in controlled environment (PT) Description: LTG: Patient will propel wheelchair in controlled environment, # of feet with assist (PT) Outcome:  Completed/Met Goal: LTG Patient will propel w/c in home environment (PT) Description: LTG: Patient will propel wheelchair in home environment, # of feet with assistance (PT). Outcome: Completed/Met   "

## 2024-05-12 NOTE — Plan of Care (Signed)
" °  Problem: Consults Goal: RH STROKE PATIENT EDUCATION Description: See Patient Education module for education specifics  Outcome: Progressing Goal: Diabetes Guidelines if Diabetic/Glucose > 140 Description: If diabetic or lab glucose is > 140 mg/dl - Initiate Diabetes/Hyperglycemia Guidelines & Document Interventions  Outcome: Progressing   Problem: RH BOWEL ELIMINATION Goal: RH STG MANAGE BOWEL WITH ASSISTANCE Description: STG Manage Bowel with mod I Assistance. Outcome: Progressing Goal: RH STG MANAGE BOWEL W/MEDICATION W/ASSISTANCE Description: STG Manage Bowel with Medication with mod I Assistance. Outcome: Progressing   Problem: RH BLADDER ELIMINATION Goal: RH STG MANAGE BLADDER WITH ASSISTANCE Description: STG Manage Bladder With toileting Assistance Outcome: Progressing   Problem: RH SAFETY Goal: RH STG ADHERE TO SAFETY PRECAUTIONS W/ASSISTANCE/DEVICE Description: STG Adhere to Safety Precautions With cues Assistance/Device. Outcome: Progressing   Problem: RH KNOWLEDGE DEFICIT Goal: RH STG INCREASE KNOWLEDGE OF DIABETES Description: Patient and dtr will be able to manage DM using educational resources for medications and dietary modification independently Outcome: Progressing Goal: RH STG INCREASE KNOWLEDGE OF HYPERTENSION Description: Patient and dtr will be able to manage HTN using educational resources for medications and dietary modification independently Outcome: Progressing Goal: RH STG INCREASE KNOWLEGDE OF HYPERLIPIDEMIA Description: Patient and dtr will be able to manage HLD using educational resources for medications and dietary modification independently Outcome: Progressing Goal: RH STG INCREASE KNOWLEDGE OF STROKE PROPHYLAXIS Description: Patient and dtr will be able to manage secondary risks using educational resources for medications and dietary modification independently Outcome: Progressing   Problem: Education: Goal: Knowledge of General Education  information will improve Description: Including pain rating scale, medication(s)/side effects and non-pharmacologic comfort measures Outcome: Progressing   Problem: Health Behavior/Discharge Planning: Goal: Ability to manage health-related needs will improve Outcome: Progressing   Problem: Clinical Measurements: Goal: Ability to maintain clinical measurements within normal limits will improve Outcome: Progressing Goal: Will remain free from infection Outcome: Progressing Goal: Diagnostic test results will improve Outcome: Progressing Goal: Respiratory complications will improve Outcome: Progressing Goal: Cardiovascular complication will be avoided Outcome: Progressing   Problem: Activity: Goal: Risk for activity intolerance will decrease Outcome: Progressing   Problem: Nutrition: Goal: Adequate nutrition will be maintained Outcome: Progressing   Problem: Coping: Goal: Level of anxiety will decrease Outcome: Progressing   Problem: Elimination: Goal: Will not experience complications related to bowel motility Outcome: Progressing Goal: Will not experience complications related to urinary retention Outcome: Progressing   Problem: Pain Managment: Goal: General experience of comfort will improve and/or be controlled Outcome: Progressing   Problem: Safety: Goal: Ability to remain free from injury will improve Outcome: Progressing   Problem: Skin Integrity: Goal: Risk for impaired skin integrity will decrease Outcome: Progressing   "

## 2024-05-12 NOTE — Progress Notes (Signed)
 Occupational Therapy Session Note  Patient Details  Name: Hannah [name] MRN: [id number] Date of Birth: 08/03/1965  Today's Date: 05/12/2024 OT Individual Time: 1030-1105 OT Individual Time Calculation (min): 35 min    Short Term Goals: Week 2:  OT Short Term Goal 1 (Week 2): STG = LTG due to ELOS  Skilled Therapeutic Interventions/Progress Updates:   Patient received seated in wheelchair daughter at bedside.  Patient's daughter reports completing lots of education yesterday, but was asking about the board for transfers.  Discussed that brother and she may do different transfers due to his planned shoulder surgery.  He will do squat pivot, while she can do short walks for bathroom.  Patient reporting fatigue this session.  Daughter asking about doffing clothing.  Reviewed process to take clothing off at end of day.  Daughter indicates she is struggling with not over helping her mother.  Discussed ways to help without hovering, and helping only when she needs help, allowing time for her to complete at her own pace.  Daughter shows understanding.  Session ended slightly early due to patient fatigue - reporting poor night's sleep, and stomach pain this am thus no breakfast.  Will continue with family training as needed in pm session.  Patient left up in chair with daughter at bedside.    Therapy Documentation Precautions:  Precautions Precautions: Fall Precaution/Restrictions Comments: R hemiparesis Restrictions Weight Bearing Restrictions Per Provider Order: No General: General OT Amount of Missed Time: 10 Minutes   Pain:  Denies pain    Therapy/Group: Individual Therapy  Shavontae Gibeault M 05/12/2024, 11:06 AM

## 2024-05-13 LAB — GLUCOSE, CAPILLARY: Glucose-Capillary: 143 mg/dL — ABNORMAL HIGH (ref 70–99)

## 2024-05-13 NOTE — Progress Notes (Signed)
 "                                                        PROGRESS NOTE   Subjective/Complaints:  Pt doing well and very ready to go home! Slept poorly again. Denies pain. LBM yesterday and a smear this morning. Urinating per her usual. No other complaints or concerns. All questions answered   ROS: as per HPI. Denies CP, SOB, abd pain, N/V/D/C, or any other complaints at this time.    +insomnia   Objective:   No results found. No results for input(s): WBC, HGB, HCT, PLT in the last 72 hours.  No results for input(s): NA, K, CL, CO2, GLUCOSE, BUN, CREATININE, CALCIUM  in the last 72 hours.   No intake or output data in the 24 hours ending 05/13/24 0959        Physical Exam: Vital Signs Blood pressure 136/65, pulse 79, temperature 98.3 F (36.8 C), temperature source Oral, resp. rate 15, height 5' 2.4 (1.585 m), weight 43.5 kg, SpO2 100%.  Gen: no distress, normal appearing. Sitting up in bed HEENT: oral mucosa pink and moist, NCAT Cardio: Reg rate and rhythm. No m/r/g.  Chest: normal effort, normal rate of breathing. CTAB.  Abd: soft, non-distended, +BS normoactive, nonTTP Ext: no edema.  Psych: pleasant, normal affect  PRIOR EXAMS: Skin: foam dressing on sacrum Neuro: +generalized muscle wasting, +ataxic speech- improving, 4-/5 strength on the right side, otherwise strength is 5/5      Assessment/Plan: 1. Functional deficits which require 3+ hours per day of interdisciplinary therapy in a comprehensive inpatient rehab setting. Physiatrist is providing close team supervision and 24 hour management of active medical problems listed below. Physiatrist and rehab team continue to assess barriers to discharge/monitor patient progress toward functional and medical goals  Care Tool:  Bathing    Body parts bathed by patient: Right arm, Chest, Abdomen, Right upper leg, Left upper leg, Face, Front perineal area, Buttocks, Left lower leg   Body parts  bathed by helper: Right lower leg, Left arm     Bathing assist Assist Level: Minimal Assistance - Patient > 75%     Upper Body Dressing/Undressing Upper body dressing   What is the patient wearing?: Pull over shirt    Upper body assist Assist Level: Supervision/Verbal cueing    Lower Body Dressing/Undressing Lower body dressing      What is the patient wearing?: Incontinence brief, Pants     Lower body assist Assist for lower body dressing: Minimal Assistance - Patient > 75%     Toileting Toileting    Toileting assist Assist for toileting: Minimal Assistance - Patient > 75%     Transfers Chair/bed transfer  Transfers assist     Chair/bed transfer assist level: Minimal Assistance - Patient > 75%     Locomotion Ambulation   Ambulation assist      Assist level: Contact Guard/Touching assist Assistive device: Walker-rolling Max distance: 55ft   Walk 10 feet activity   Assist  Walk 10 feet activity did not occur: Safety/medical concerns (fatigue, weakness/deconditioning)  Assist level: Contact Guard/Touching assist Assistive device: Walker-rolling   Walk 50 feet activity   Assist Walk 50 feet with 2 turns activity did not occur: Safety/medical concerns (fatigue, weakness/deconditioning)  Assist level: Moderate Assistance - Patient - 50 - 74%  Assistive device: Walker-Eva    Walk 150 feet activity   Assist Walk 150 feet activity did not occur: Safety/medical concerns (fatigue/weakness/deconditioning)         Walk 10 feet on uneven surface  activity   Assist Walk 10 feet on uneven surfaces activity did not occur: Safety/medical concerns (fatigue/weakness/deconditioning)         Wheelchair     Assist Is the patient using a wheelchair?: Yes Type of Wheelchair: Manual    Wheelchair assist level: Supervision/Verbal cueing Max wheelchair distance: 143ft    Wheelchair 50 feet with 2 turns activity    Assist        Assist  Level: Supervision/Verbal cueing   Wheelchair 150 feet activity     Assist      Assist Level: Supervision/Verbal cueing   Blood pressure 136/65, pulse 79, temperature 98.3 F (36.8 C), temperature source Oral, resp. rate 15, height 5' 2.4 (1.585 m), weight 43.5 kg, SpO2 100%.  Medical Problem List and Plan: 1. Functional deficits secondary to RIght dorsal midbrain infarct             -patient may  shower             -ELOS/Goals: 13 days, S sup/minA--estimated discharge 12-28  Chart and therapy notes reviewed, continue CIR, discussed patient's progress with therapy Grounds pass ordered Vitamin D3/Metanx/Vitamin B/C complex ordered F/u with me or Fidela in clinic within 1 month Would benefit from home health aide upon discharge  This patient is capable of making decisions on her own behalf. Updated daughter  -05/13/24 d/c today, paperwork discussed.  2.  Anemia: Heparin  d/ced due to bleeding from fistula site, Hgb reviewed and is now stable, stool occult reviewed and is negative             -antiplatelet therapy: continue ASA/Ticagrelor   3. H/o Sacroiliitis: Tylenol  prn mild pain. D/c norco, lidocaine  patch ordered, kpad ordered  4. Insomnia: scheduled melatonin 5mg  HS   5. T2DM:  Hgb A1c-  8.5. Monitor BS ac/hs             --was on insulin  glargine 12 units w/ 5 units novolog  TID. Increase glargine to 11U but patient refuses this dose and wants 10U due to past experiences with hypoglycemia- changed back             --will continue to monitor BS for trend and titrate insulin  as needed.   -discussed that CBGs were elevated yesterday due to holiday meal  D/c ISS -05/12/24 CBG high again 300 around 9pm, suspect could be food related-- otherwise CBGs fine. Monitor. -- improving 12/28 Recent Labs    05/12/24 1629 05/12/24 2005 05/13/24 0544  GLUCAP 152* 198* 143*      6. ESRD: HD MWF at the end of the day to help with tolerance of therapy.              --Phoslo  tid ac for  metabolic bone disease.   continue fluid restriction--labs stable  7. Hypertension: increase amlodipine  to 10mg  daily, continue coreg , hydralazine  and imdur .  -05/13/24 BPs fine, monitor outpatient Vitals:   05/11/24 1500 05/11/24 1530 05/11/24 1600 05/11/24 1630  BP: (!) 169/66 (!) 182/72 (!) 168/102 (!) 188/63   05/11/24 1700 05/11/24 1730 05/11/24 1826 05/11/24 1921  BP: (!) 178/65 (!) 157/60 (!) 196/80 (!) 158/66   05/12/24 0550 05/12/24 1435 05/12/24 1942 05/13/24 0441  BP: 130/62 139/60 127/61 136/65    8. H/o Chorioretinitis: continue azithropine daily. SABRA  9. Constipation: LBM 12/22, senna ordered HS, messaged nursing to confirm accuract of last BM. LBM 12/27  10. GERD: continue pepcid    LOS: 13 days A FACE TO FACE EVALUATION WAS PERFORMED  [address] 05/13/2024, 9:59 AM     "

## 2024-05-13 NOTE — Plan of Care (Signed)
 [facility] Patient Discharge Orders- [facility] CLINIC: [facility]   Patient's name: Hannah [name] Admit/DC Dates: 04/30/2024 - 05/13/2024  Discharge Diagnoses: Acute CVA --completed inpatient rehab.    Outpatient Dialysis Orders:  -Heparin : None  -EDW 44.5 kg   -Bath: No change  Anemia Aranesp : Given: Y   Date of last dose/amount: 12/27 40 mcg    PRBC's Given: -- Date/# of units: -- ESA dose for discharge: Mircera 75 mcg IV q 2 weeks   Recent Labs  Lab 05/08/24 1353  HGB 8.1*  K 4.2  CALCIUM  9.0  PHOS 3.6  ALBUMIN 3.6   Access intervention/Change:  none   Medications: -IV Antibiotics: none  -Other anticoagulation: none    OTHER/APPTS/LABS   Completed by: [name] PA-C   D/C Meds to be reconciled by nurse after every discharge.    Reviewed by: MD:______ RN_______

## 2024-05-14 ENCOUNTER — Telehealth: Payer: Self-pay | Admitting: Nephrology

## 2024-05-14 NOTE — Progress Notes (Signed)
 Inpatient Rehabilitation Care Coordinator Discharge Note   Patient Details  Name: Hannah [name] MRN: [id number] Date of Birth: [birth year]   Discharge location: Home with daughter  Length of Stay: 12 days  Discharge activity level: Minimal Assistance - Patient > 75%  Home/community participation: Homebound  Patient response un:Yzjouy Literacy - How often do you need to have someone help you when you read instructions, pamphlets, or other written material from your doctor or pharmacy?: Sometimes  Patient response un:Dnrpjo Isolation - How often do you feel lonely or isolated from those around you?: Never  Services provided included: MD, RD, PT, OT, SLP, RN, CM, TR, Pharmacy, SW  Financial Services:  Field Seismologist Utilized: Private Insurance [employer] MEDICARE / [facility] MEDICARE  Choices offered to/list presented to: Patient/Family  Follow-up services arranged:  Home Health, DME Home Health Agency: [facility]    DME : Hospital Bed - [facility]    Patient response to transportation need: Is the patient able to respond to transportation needs?: Yes In the past 12 months, has lack of transportation kept you from medical appointments or from getting medications?: No In the past 12 months, has lack of transportation kept you from meetings, work, or from getting things needed for daily living?: No   Patient/Family verbalized understanding of follow-up arrangements:  Yes  Individual responsible for coordination of the follow-up plan: Patient/Family - Daughter [name]  Confirmed correct DME delivered: [name]  [name] 05/14/2024    Comments (or additional information): Patient's daughter, [name] and son, [name] participated in family education and able to address 24/7 care needs.   Summary of Stay    Date/Time Discharge Planning CSW  05/08/24 1034 Plans to go home with daughter, [name] (works during the day) and son [name] (works night) roatating for care. Family ed has  occured with [name] - awaiting response from [facility]. Has specialty wheelchair and other DME. Family is following up on Medicaid application. Awaiting follow-up therapy recommendations. DS  05/01/24 1544 Plans to go home with daughter, [name] (works during the day) and son [name] (works night) roatating for care. Has specialty wheelchair and other DME. Awaiting follow-up therapy recommendations. DS       [name]  [name]

## 2024-05-14 NOTE — Telephone Encounter (Signed)
 Transition of Care - Initial Contact after Hospitalization  Date of discharge:  05/13/2024 Date of contact: 05/14/2024  Method: Phone Spoke to: Patient's daughter  Patient contacted to discuss transition of care from recent inpatient hospitalization. Patient was admitted to [facility] + CIR from 12/15 to 05/13/2024 discharge diagnosis of debility s/p acute CVA.  The discharge medication list was reviewed. Patient understands the changes and has no concerns. Will skip AM dose of Coreg  and hydralazine  on HD days to start - add back if needed.  Pt went for dialysis this AM - did well. Resting now.  No other concerns at this time.  Izetta Boehringer, PA-C [employer] Pager [phone]

## 2024-05-31 ENCOUNTER — Emergency Department (HOSPITAL_COMMUNITY)
Admission: EM | Admit: 2024-05-31 | Discharge: 2024-05-31 | Disposition: A | Discharge status: Home / Self Care | Attending: Emergency Medicine | Admitting: Emergency Medicine

## 2024-05-31 ENCOUNTER — Other Ambulatory Visit: Payer: Self-pay

## 2024-05-31 ENCOUNTER — Encounter (HOSPITAL_COMMUNITY): Payer: Self-pay

## 2024-05-31 DIAGNOSIS — Z79899 Other long term (current) drug therapy: Secondary | ICD-10-CM | POA: Insufficient documentation

## 2024-05-31 DIAGNOSIS — N186 End stage renal disease: Secondary | ICD-10-CM | POA: Insufficient documentation

## 2024-05-31 DIAGNOSIS — Z7982 Long term (current) use of aspirin: Secondary | ICD-10-CM | POA: Insufficient documentation

## 2024-05-31 DIAGNOSIS — D631 Anemia in chronic kidney disease: Secondary | ICD-10-CM | POA: Diagnosis not present

## 2024-05-31 DIAGNOSIS — Z992 Dependence on renal dialysis: Secondary | ICD-10-CM | POA: Diagnosis not present

## 2024-05-31 DIAGNOSIS — R799 Abnormal finding of blood chemistry, unspecified: Secondary | ICD-10-CM | POA: Diagnosis present

## 2024-05-31 LAB — COMPREHENSIVE METABOLIC PANEL WITH GFR
ALT: <5 U/L (ref 0–44)
AST: 17 U/L (ref 15–41)
Albumin: 3.8 g/dL (ref 3.5–5.0)
Alkaline Phosphatase: 58 U/L (ref 38–126)
Anion gap: 10 (ref 5–15)
BUN: 25 mg/dL — ABNORMAL HIGH (ref 6–20)
CO2: 31 mmol/L (ref 22–32)
Calcium: 9.3 mg/dL (ref 8.9–10.3)
Chloride: 98 mmol/L (ref 98–111)
Creatinine, Ser: 5.27 mg/dL — ABNORMAL HIGH (ref 0.44–1.00)
GFR, Estimated: 9 mL/min — ABNORMAL LOW
Glucose, Bld: 151 mg/dL — ABNORMAL HIGH (ref 70–99)
Potassium: 3.8 mmol/L (ref 3.5–5.1)
Sodium: 139 mmol/L (ref 135–145)
Total Bilirubin: 0.3 mg/dL (ref 0.0–1.2)
Total Protein: 7.1 g/dL (ref 6.5–8.1)

## 2024-05-31 LAB — CBC WITH DIFFERENTIAL/PLATELET
Abs Immature Granulocytes: 0.01 K/uL (ref 0.00–0.07)
Basophils Absolute: 0 K/uL (ref 0.0–0.1)
Basophils Relative: 1 %
Eosinophils Absolute: 0.2 K/uL (ref 0.0–0.5)
Eosinophils Relative: 8 %
HCT: 22.8 % — ABNORMAL LOW (ref 36.0–46.0)
Hemoglobin: 7.3 g/dL — ABNORMAL LOW (ref 12.0–15.0)
Immature Granulocytes: 1 %
Lymphocytes Relative: 47 %
Lymphs Abs: 0.9 K/uL (ref 0.7–4.0)
MCH: 33.3 pg (ref 26.0–34.0)
MCHC: 32 g/dL (ref 30.0–36.0)
MCV: 104.1 fL — ABNORMAL HIGH (ref 80.0–100.0)
Monocytes Absolute: 0.3 K/uL (ref 0.1–1.0)
Monocytes Relative: 15 %
Neutro Abs: 0.5 K/uL — ABNORMAL LOW (ref 1.7–7.7)
Neutrophils Relative %: 28 %
Platelets: 295 K/uL (ref 150–400)
RBC: 2.19 MIL/uL — ABNORMAL LOW (ref 3.87–5.11)
RDW: 20.1 % — ABNORMAL HIGH (ref 11.5–15.5)
Smear Review: NORMAL
WBC: 1.9 K/uL — ABNORMAL LOW (ref 4.0–10.5)
nRBC: 1.6 % — ABNORMAL HIGH (ref 0.0–0.2)

## 2024-05-31 LAB — PROTIME-INR
INR: 1.1 (ref 0.8–1.2)
Prothrombin Time: 14.6 s (ref 11.4–15.2)

## 2024-05-31 NOTE — ED Triage Notes (Signed)
 Pt to er, pt states that she is here because the dialysis clinic called and said that she needed a blood transfusion.  Pt states that she is always cold.

## 2024-05-31 NOTE — Discharge Instructions (Signed)
 Please go to dialysis as scheduled.  Discussed with them the plan with your low blood level.  They may schedule for this to be done as an outpatient.  Please return if you develop difficulty breathing or feel you are in a pass out.  Please return for dark stool or blood in your stool.

## 2024-05-31 NOTE — ED Provider Triage Note (Signed)
 Emergency Medicine Provider Triage Evaluation Note  Hannah [name] , a 59 y.o. female  was evaluated in triage.  Pt complains of low hemoglobin.  Daughter states called from dialysis clinic and told hemoglobin was 6.6 and she go to ED for blood transfusion.  Patient states history of anemia.  On aspirin  but no other blood thinners.  Denies hematemesis, hematochezia, chest pain, shortness of breath, dysuria, hematuria.  No increased fatigue or weakness.  Review of Systems  Positive:  Negative: See above  Physical Exam  BP (!) 148/60   Pulse 94   Temp 99.2 F (37.3 C)   Resp 18   Ht 5' 2 (1.575 m)   Wt 44.5 kg   SpO2 97%   BMI 17.94 kg/m  Gen:   Awake, no distress   Resp:  Normal effort  MSK:   Moves extremities without difficulty  Other:    Medical Decision Making  Medically screening exam initiated at 6:13 PM.  Appropriate orders placed.  Hannah [name] was informed that the remainder of the evaluation will be completed by another provider, this initial triage assessment does not replace that evaluation, and the importance of remaining in the ED until their evaluation is complete.     Hannah [name], [place] 05/31/24 1814

## 2024-05-31 NOTE — ED Triage Notes (Signed)
 Pts dialysis clinical called pt and stated HBG was low. Denies bloody/stools/urine. Denies n/v/CP. Pt received full dialysis tx yesterday. Axox4.

## 2024-05-31 NOTE — ED Provider Notes (Signed)
 " [place] EMERGENCY DEPARTMENT AT [facility] Provider Note   CSN: [id number] Arrival date & time: 05/31/24  1736     Patient presents with: Abnormal Lab   Hannah [name] is a 59 y.o. female.   59 yo F with a chief complaints of a low blood level.  Patient had this checked during dialysis on Monday and it was 6.6.  She was scheduled for transfusion but was called back today and told to urgently come to the ED to have a transfusion performed.  The patient feels fine.  She denies feeling weak or dizzy.  Denies feeling she might pass out.  She denies dark stool or blood in her stool.  Denies chest pain or difficulty breathing.   Abnormal Lab      Prior to Admission medications  Medication Sig Start Date End Date Taking? Authorizing Provider  acetaminophen  (TYLENOL ) 500 MG tablet Take 500 mg by mouth every 6 (six) hours as needed for mild pain (pain score 1-3), moderate pain (pain score 4-6), fever or headache.    [provider]  amLODipine  (NORVASC ) 10 MG tablet Take 1 tablet (10 mg total) by mouth daily. 05/11/24   Angiulli, Toribio PARAS, PA-C  aspirin  EC 81 MG tablet Take 1 tablet (81 mg total) by mouth daily. Swallow whole. 05/01/24   Perri DELENA Meliton Mickey., MD  azaTHIOprine  (IMURAN ) 50 MG tablet Take 1 tablet (50 mg total) by mouth daily. 04/30/24 04/30/25  Perri DELENA Meliton Mickey., MD  B Complex-C (B-COMPLEX WITH VITAMIN C) tablet Take 1 tablet by mouth daily. 05/11/24   Angiulli, Toribio PARAS, PA-C  calcium  acetate (PHOSLO ) 667 MG capsule Take 1 capsule (667 mg total) by mouth 3 (three) times daily with meals. 05/11/24   Angiulli, Toribio PARAS, PA-C  carvedilol  (COREG ) 25 MG tablet Take 1 tablet (25 mg total) by mouth 2 (two) times daily with a meal. 05/11/24   Angiulli, Toribio PARAS, PA-C  vitamin D3 (CHOLECALCIFEROL ) 25 MCG tablet Take 2 tablets (2,000 Units total) by mouth daily. 05/11/24   Angiulli, Toribio PARAS, PA-C  Continuous Blood Gluc Sensor (FREESTYLE LIBRE 2 SENSOR) MISC  USE AS DIRECTED CHANGE EVERY 14 DAYS 12/30/20   [provider]  Darbepoetin Alfa  (ARANESP ) 40 MCG/0.4ML SOSY injection Inject 0.4 mLs (40 mcg total) into the skin every Friday at 6 PM. 05/11/24   Angiulli, Toribio PARAS, PA-C  diclofenac  Sodium (VOLTAREN ) 1 % GEL Apply 2 g topically 4 (four) times daily. To leg 05/11/24   Angiulli, Toribio PARAS, PA-C  docusate sodium  (COLACE) 100 MG capsule Take 1 capsule (100 mg total) by mouth daily. 05/11/24   Angiulli, Toribio PARAS, PA-C  famotidine  (PEPCID ) 20 MG tablet Take 1 tablet (20 mg total) by mouth at bedtime. 05/11/24   Angiulli, Toribio PARAS, PA-C  hydrALAZINE  (APRESOLINE ) 50 MG tablet Take 1 tablet (50 mg total) by mouth 3 (three) times daily. 05/11/24   Angiulli, Toribio PARAS, PA-C  insulin  degludec (TRESIBA  FLEXTOUCH) 100 UNIT/ML FlexTouch Pen Inject 12 Units into the skin at bedtime. Patient taking differently: Inject 8-10 Units into the skin at bedtime. 11/03/20   Love, Sharlet RAMAN, PA-C  isosorbide  mononitrate (IMDUR ) 30 MG 24 hr tablet Take 1 tablet (30 mg total) by mouth at bedtime. 05/11/24   Angiulli, Toribio PARAS, PA-C  lidocaine  (LIDODERM ) 5 % Place 1 patch onto the skin daily. Remove & Discard patch within 12 hours or as directed by MD 05/11/24   Angiulli, Toribio PARAS, PA-C  melatonin 5  MG TABS Take 1 tablet (5 mg total) by mouth at bedtime as needed. 05/11/24   Angiulli, Toribio PARAS, PA-C  nitroGLYCERIN  (NITROSTAT ) 0.4 MG SL tablet Place 1 tablet (0.4 mg total) under the tongue every 5 (five) minutes as needed for chest pain. 06/25/20   Angiulli, Toribio PARAS, PA-C  ondansetron  (ZOFRAN ) 4 MG tablet Take 1 tablet (4 mg total) by mouth every 8 (eight) hours as needed for nausea or vomiting. 05/11/24   Angiulli, Toribio PARAS, PA-C  polyethylene glycol (MIRALAX  / GLYCOLAX ) 17 g packet Take 17 g by mouth daily. Patient taking differently: Take 17 g by mouth daily as needed for moderate constipation. 11/04/20   Love, Sharlet RAMAN, PA-C  rosuvastatin  (CRESTOR ) 20 MG tablet Take 1 tablet  (20 mg total) by mouth daily. 05/11/24   Angiulli, Toribio PARAS, PA-C    Allergies: Ibuprofen, Influenza vaccines, Pneumococcal vaccine, Promethazine, Ambien  [zolpidem ], Codeine, Cyclobenzaprine, Hydrocodone -acetaminophen , Tape, Tramadol  hcl, Wound dressing adhesive, and Oxycodone     Review of Systems  Updated Vital Signs BP (!) 136/57   Pulse 91   Temp 98.1 F (36.7 C)   Resp 18   Ht 5' 2 (1.575 m)   Wt 44.5 kg   SpO2 100%   BMI 17.94 kg/m   Physical Exam Vitals and nursing note reviewed.  Constitutional:      General: She is not in acute distress.    Appearance: She is well-developed. She is not diaphoretic.  HENT:     Head: Normocephalic and atraumatic.  Eyes:     Pupils: Pupils are equal, round, and reactive to light.     Comments: Pale conjunctiva  Cardiovascular:     Rate and Rhythm: Normal rate and regular rhythm.     Heart sounds: No murmur heard.    No friction rub. No gallop.  Pulmonary:     Effort: Pulmonary effort is normal.     Breath sounds: No wheezing or rales.  Abdominal:     General: There is no distension.     Palpations: Abdomen is soft.     Tenderness: There is no abdominal tenderness.  Musculoskeletal:        General: No tenderness.     Cervical back: Normal range of motion and neck supple.  Skin:    General: Skin is warm and dry.  Neurological:     Mental Status: She is alert and oriented to person, place, and time.  Psychiatric:        Behavior: Behavior normal.     (all labs ordered are listed, but only abnormal results are displayed) Labs Reviewed  COMPREHENSIVE METABOLIC PANEL WITH GFR - Abnormal; Notable for the following components:      Result Value   Glucose, Bld 151 (*)    BUN 25 (*)    Creatinine, Ser 5.27 (*)    GFR, Estimated 9 (*)    All other components within normal limits  CBC WITH DIFFERENTIAL/PLATELET - Abnormal; Notable for the following components:   WBC 1.9 (*)    RBC 2.19 (*)    Hemoglobin 7.3 (*)    HCT 22.8  (*)    MCV 104.1 (*)    RDW 20.1 (*)    nRBC 1.6 (*)    Neutro Abs 0.5 (*)    All other components within normal limits  PROTIME-INR  PATHOLOGIST SMEAR REVIEW  TYPE AND SCREEN    EKG: None  Radiology: No results found.   Procedures   Medications Ordered in the ED -  No data to display                                  Medical Decision Making  59 yo F with a chief complaints of needing a blood transfusion.  This was told to her by the dialysis center.  She was called today and told to urgently come after a blood draw that was on Monday.  Her check today is over 7.  I did offer to give her a unit of blood.  I did discuss other options including following up with her dialysis center and they told me she does have scheduled blood transfusion on the 23rd.  She is asymptomatic, denies any GI bleeding.  I did encourage her to return anytime things worsened.  11:45 PM:  I have discussed the diagnosis/risks/treatment options with the patient and family.  Evaluation and diagnostic testing in the emergency department does not suggest an emergent condition requiring admission or immediate intervention beyond what has been performed at this time.  They will follow up with PCP, dialysis. We also discussed returning to the ED immediately if new or worsening sx occur. We discussed the sx which are most concerning (e.g., sudden worsening pain, fever, inability to tolerate by mouth, syncope, chest pain, weakness) that necessitate immediate return. Medications administered to the patient during their visit and any new prescriptions provided to the patient are listed below.  Medications given during this visit Medications - No data to display   The patient appears reasonably screen and/or stabilized for discharge and I doubt any other medical condition or other [facility] requiring further screening, evaluation, or treatment in the ED at this time prior to discharge.       Final diagnoses:  Anemia due to  chronic kidney disease, on chronic dialysis [facility])    ED Discharge Orders     None          Emil Share, DO 05/31/24 2345  "

## 2024-06-01 LAB — PATHOLOGIST SMEAR REVIEW

## 2024-06-04 ENCOUNTER — Other Ambulatory Visit (HOSPITAL_COMMUNITY): Payer: Self-pay | Admitting: *Deleted

## 2024-06-04 DIAGNOSIS — D649 Anemia, unspecified: Secondary | ICD-10-CM

## 2024-06-04 LAB — TYPE AND SCREEN
ABO/RH(D): A POS
Antibody Screen: NEGATIVE
Donor AG Type: NEGATIVE
Donor AG Type: NEGATIVE
Unit division: 0
Unit division: 0

## 2024-06-04 LAB — BPAM RBC
Blood Product Expiration Date: 202601232359
Blood Product Expiration Date: 202601292359
Unit Type and Rh: 6200
Unit Type and Rh: 6200

## 2024-06-05 ENCOUNTER — Inpatient Hospital Stay (HOSPITAL_COMMUNITY): Admission: RE | Admit: 2024-06-05 | Source: Ambulatory Visit

## 2024-06-07 ENCOUNTER — Encounter: Payer: Self-pay | Admitting: Gastroenterology

## 2024-06-08 NOTE — Progress Notes (Signed)
 Hannah [name]                                          MRN: [id number]   06/08/2024   The [facility]BCI Quality Team Specialist reviewed this patient medical record for the purposes of chart review for care gap closure. The following were reviewed: abstraction for care gap closure-glycemic status assessment.    VBCI Quality Team

## 2024-06-08 NOTE — Progress Notes (Unsigned)
 "  Subjective:    Patient ID: Hannah [name], female    DOB: 06-Jun-1965, 59 y.o.   MRN: [id number]  HPI   Pain Inventory Average Pain {NUMBERS; 0-10:5044} Pain Right Now {NUMBERS; 0-10:5044} My pain is {PAIN DESCRIPTION:21022940}  LOCATION OF PAIN  ***  BOWEL Number of stools per week: *** Oral laxative use {YES/NO:21197} Type of laxative *** Enema or suppository use {YES/NO:21197} History of colostomy {YES/NO:21197} Incontinent {YES/NO:21197}  BLADDER {bladder options:24190} In and out cath, frequency *** Able to self cath {YES/NO:21197} Bladder incontinence {YES/NO:21197} Frequent urination {YES/NO:21197} Leakage with coughing {YES/NO:21197} Difficulty starting stream {YES/NO:21197} Incomplete bladder emptying {YES/NO:21197}   Mobility {MOBILITY JIO:78977055}  Function {FUNCTION:21022946}  Neuro/Psych {NEURO/PSYCH:21022948}  Prior Studies {CPRM PRIOR STUDIES:21022953}  Physicians involved in your care {CPRM PHYSICIANS INVOLVED IN YOUR CARE:21022954}   No family history on file. Social History   Socioeconomic History   Marital status: Divorced    Spouse name: Not on file   Number of children: Not on file   Years of education: Not on file   Highest education level: Not on file  Occupational History   Occupation: disabled  Tobacco Use   Smoking status: Never   Smokeless tobacco: Never  Vaping Use   Vaping status: Never Used  Substance and Sexual Activity   Alcohol use: Not Currently   Drug use: Never   Sexual activity: Not on file  Other Topics Concern   Not on file  Social History Narrative   Not on file   Social Drivers of Health   Tobacco Use: Low Risk (05/31/2024)   Patient History    Smoking Tobacco Use: Never    Smokeless Tobacco Use: Never    Passive Exposure: Not on file  Financial Resource Strain: Low Risk (03/22/2024)   Received from [facility]   Overall Financial Resource Strain (CARDIA)    How hard is it for you to pay for  the very basics like food, housing, medical care, and heating?: Not very hard  Food Insecurity: No Food Insecurity (05/11/2024)   Epic    Worried About Radiation Protection Practitioner of Food in the Last Year: Never true    Ran Out of Food in the Last Year: Never true  Transportation Needs: No Transportation Needs (05/11/2024)   Epic    Lack of Transportation (Medical): No    Lack of Transportation (Non-Medical): No  Physical Activity: Unknown (03/22/2024)   Received from [facility]   Exercise Vital Sign    On average, how many days per week do you engage in moderate to strenuous exercise (like a brisk walk)?: Patient declined    Minutes of Exercise per Session: Not on file  Stress: Stress Concern Present (03/22/2024)   Received from [facility] - Occupational Stress Questionnaire    Do you feel stress - tense, restless, nervous, or anxious, or unable to sleep at night because your mind is troubled all the time - these days?: To some extent  Social Connections: Moderately Integrated (03/22/2024)   Received from [facility]   Social Network    How would you rate your social network (family, work, friends)?: Adequate participation with social networks  Depression (PHQ2-9): Not on file  Alcohol Screen: Not on file  Housing: Low Risk (05/11/2024)   Epic    Unable to Pay for Housing in the Last Year: No    Number of Times Moved in the Last Year: 0    Homeless in the Last Year:  No  Utilities: Not At Risk (05/11/2024)   Epic    Threatened with loss of utilities: No  Health Literacy: Not on file   Past Surgical History:  Procedure Laterality Date   A/V FISTULAGRAM Right 02/12/2021   Procedure: A/V FISTULAGRAM;  Surgeon: Gretta Lonni PARAS, MD;  Location: [facility] INVASIVE CV LAB;  Service: Cardiovascular;  Laterality: Right;   A/V FISTULAGRAM Right 05/20/2022   Procedure: A/V Fistulagram;  Surgeon: Gretta Lonni PARAS, MD;  Location: [facility] INVASIVE CV LAB;   Service: Cardiovascular;  Laterality: Right;   A/V FISTULAGRAM N/A 05/05/2023   Procedure: A/V Fistulagram;  Surgeon: Melia Lynwood ORN, MD;  Location: [facility] INVASIVE CV LAB;  Service: Cardiovascular;  Laterality: N/A;   A/V SHUNT INTERVENTION N/A 08/16/2023   Procedure: A/V SHUNT INTERVENTION;  Surgeon: Tobie Gordy POUR, MD;  Location: [facility] INVASIVE CV LAB;  Service: Cardiovascular;  Laterality: N/A;   A/V SHUNTOGRAM Right 09/26/2023   Procedure: A/V SHUNTOGRAM;  Surgeon: Pearline Norman RAMAN, MD;  Location: [facility] INVASIVE CV LAB;  Service: Cardiovascular;  Laterality: Right;   APPENDECTOMY     AV FISTULA PLACEMENT Right 10/02/2020   Procedure: RIGHT ARTERIOVENOUS (AV) FISTULA CREATION;  Surgeon: Sheree Penne Lonni, MD;  Location: [facility] OR;  Service: Vascular;  Laterality: Right;   AV FISTULA PLACEMENT Right 05/28/2022   Procedure: RIGHT ARM ARTERIOVENOUS (AV) FISTULA REVISION WITH GORETEX GRAFT;  Surgeon: Gretta Lonni PARAS, MD;  Location: [facility] OR;  Service: Vascular;  Laterality: Right;   BASCILIC VEIN TRANSPOSITION Right 11/24/2020   Procedure: SECOND STAGE RIGHT ARM BASCILIC VEIN TRANSPOSITION;  Surgeon: Gretta Lonni PARAS, MD;  Location: [facility] OR;  Service: Vascular;  Laterality: Right;   CYSTOSTOMY W/ BLADDER BIOPSY     INSERTION OF DIALYSIS CATHETER Right 05/28/2022   Procedure: INSERTION OF TUNNELED DIALYSIS CATHETER IN THE RIGHT INTERNAL JUGULAR;  Surgeon: Gretta Lonni PARAS, MD;  Location: [facility] OR;  Service: Vascular;  Laterality: Right;   IR FLUORO GUIDE CV LINE RIGHT  09/29/2020   IR US  GUIDE VASC ACCESS RIGHT  09/29/2020   NM MYOVIEW  LTD  12/25/2015   [place], [place] - NORMAL /NEGATIVE for ischemia. Normal LV Fxn   OVARIAN CYST SURGERY     PERCUTANEOUS CORONARY STENT INTERVENTION (PCI-S)  2009   [place], [place] - 2 stents in settng of MI -- unknown details   PERIPHERAL VASCULAR BALLOON ANGIOPLASTY Right 02/12/2021   Procedure: PERIPHERAL VASCULAR BALLOON ANGIOPLASTY;  Surgeon: Gretta Lonni PARAS,  MD;  Location: [facility] INVASIVE CV LAB;  Service: Cardiovascular;  Laterality: Right;  UPPER ARM FISTULA   PERIPHERAL VASCULAR BALLOON ANGIOPLASTY  05/20/2022   Procedure: PERIPHERAL VASCULAR BALLOON ANGIOPLASTY;  Surgeon: Gretta Lonni PARAS, MD;  Location: [facility] INVASIVE CV LAB;  Service: Cardiovascular;;  rt arm fistula site   PERIPHERAL VASCULAR BALLOON ANGIOPLASTY Right 01/28/2022   Procedure: PERIPHERAL VASCULAR BALLOON ANGIOPLASTY;  Surgeon: Gretta Lonni PARAS, MD;  Location: [facility] INVASIVE CV LAB;  Service: Cardiovascular;  Laterality: Right;  Arm fistula   PERIPHERAL VASCULAR BALLOON ANGIOPLASTY  05/05/2023   Procedure: PERIPHERAL VASCULAR BALLOON ANGIOPLASTY;  Surgeon: Melia Lynwood ORN, MD;  Location: [facility] INVASIVE CV LAB;  Service: Cardiovascular;;  RUA AVF   TRANSTHORACIC ECHOCARDIOGRAM  07/11/2017   a) 12/2015, [place], [place]): Moderate Conc LVH w/ sigmoid septum.  No LVOT obstruction.  Hyperdynamic LV fxn - EF 65 to 70%.  GR 1 DD & high LAP/LVEDP.  AoV Sclerosis - No AS;; b) 06/2017: [facility]): Normal LV Fxn - EF 60%.  Abnormal septal  motion.  Mild LVH mild MAC.  Negative bubble study (for CVA)   TRANSTHORACIC ECHOCARDIOGRAM  12/2020   a) 05/2020: EF 60-65%. No RWMA. Gr2DD. Sm posterior pericardial effusion. No AS.; b) 09/2020: EF 70-75%.  Hyperdynamic.  No RWMA. ? D Fxn/LAP.  Mildly elevated PAP.  Pericardial effusion.  Mild ao V sclerosis cyst.; c) 12/2020 (Limited): EF 65-70%. No RWMA. Gr1-2 DD. Mild Conc LVH- high LAP. Nl PAP. Circumferential effusion w/o tamponade. AoV Sclerosis - no AS. Nl RAP.   ULTRASOUND GUIDANCE FOR VASCULAR ACCESS Right 05/28/2022   Procedure: ULTRASOUND GUIDANCE FOR VASCULAR ACCESS;  Surgeon: Gretta Lonni PARAS, MD;  Location: [facility] OR;  Service: Vascular;  Laterality: Right;   VENOUS ANGIOPLASTY Right 08/16/2023   Procedure: VENOUS ANGIOPLASTY;  Surgeon: Tobie Gordy POUR, MD;  Location: [facility] INVASIVE CV LAB;  Service: Cardiovascular;  Laterality: Right;  70%  Innominate; 70% Outflow Basilic   Past Medical History:  Diagnosis Date   CAD S/P PCI x 2 2009   MI 2009 -> PCI x 2 ([place]); 01/2016 Nuc ST Nonischemic with Normal EF   Cerebral infarction (HCC) 07/12/2017   2009 first one, has had a total of 4 stokes- last 2021   Chronic kidney disease    Hemo M-W- F   Diabetes type 2, controlled (HCC)    Hemiparesis affecting right side as late effect of cerebrovascular accident (CVA) (HCC)    Hyperlipidemia    Hypertension    Intractable vomiting with nausea 07/12/2017   Multiple sclerosis 2009   Myocardial infarction [facility]) 2009   Stroke [facility])    There were no vitals taken for this visit.  Opioid Risk Score:   Fall Risk Score:  `1  Depression screen [facility] 2/9     01/09/2021    2:07 PM 11/10/2020   12:28 PM  Depression screen PHQ 2/9  Decreased Interest 0 0  Down, Depressed, Hopeless 0 1  PHQ - 2 Score 0 1  Altered sleeping 0   Tired, decreased energy 0   Change in appetite 0   Feeling bad or failure about yourself  2   Trouble concentrating 0   Moving slowly or fidgety/restless 0   Suicidal thoughts 0   PHQ-9 Score 2       Data saved with a previous flowsheet row definition    Review of Systems     Objective:   Physical Exam        Assessment & Plan:    "

## 2024-06-11 ENCOUNTER — Encounter: Admitting: Registered Nurse

## 2024-06-15 ENCOUNTER — Encounter: Admitting: Registered Nurse

## 2024-07-03 ENCOUNTER — Encounter: Admitting: Registered Nurse

## 2024-07-05 ENCOUNTER — Ambulatory Visit: Admitting: Gastroenterology

## 2024-08-14 ENCOUNTER — Inpatient Hospital Stay: Admitting: Neurology
